# Patient Record
Sex: Male | Born: 1940 | Race: White | Hispanic: No | Marital: Married | Smoking: Former smoker
Health system: Southern US, Community
[De-identification: ages and names within clinical notes are randomized; demographics above are authoritative.]

## PROBLEM LIST (undated history)

## (undated) DIAGNOSIS — I4510 Unspecified right bundle-branch block: Secondary | ICD-10-CM

## (undated) DIAGNOSIS — J42 Unspecified chronic bronchitis: Secondary | ICD-10-CM

## (undated) DIAGNOSIS — E785 Hyperlipidemia, unspecified: Secondary | ICD-10-CM

## (undated) DIAGNOSIS — I10 Essential (primary) hypertension: Secondary | ICD-10-CM

## (undated) DIAGNOSIS — I2510 Atherosclerotic heart disease of native coronary artery without angina pectoris: Secondary | ICD-10-CM

## (undated) DIAGNOSIS — I714 Abdominal aortic aneurysm, without rupture: Secondary | ICD-10-CM

## (undated) DIAGNOSIS — D649 Anemia, unspecified: Secondary | ICD-10-CM

## (undated) DIAGNOSIS — F17201 Nicotine dependence, unspecified, in remission: Secondary | ICD-10-CM

## (undated) DIAGNOSIS — Z5111 Encounter for antineoplastic chemotherapy: Secondary | ICD-10-CM

## (undated) DIAGNOSIS — I219 Acute myocardial infarction, unspecified: Secondary | ICD-10-CM

## (undated) DIAGNOSIS — C801 Malignant (primary) neoplasm, unspecified: Secondary | ICD-10-CM

## (undated) DIAGNOSIS — N189 Chronic kidney disease, unspecified: Secondary | ICD-10-CM

## (undated) DIAGNOSIS — M1990 Unspecified osteoarthritis, unspecified site: Secondary | ICD-10-CM

## (undated) DIAGNOSIS — J310 Chronic rhinitis: Secondary | ICD-10-CM

## (undated) DIAGNOSIS — K5790 Diverticulosis of intestine, part unspecified, without perforation or abscess without bleeding: Secondary | ICD-10-CM

## (undated) DIAGNOSIS — J189 Pneumonia, unspecified organism: Secondary | ICD-10-CM

## (undated) DIAGNOSIS — I255 Ischemic cardiomyopathy: Secondary | ICD-10-CM

## (undated) DIAGNOSIS — N529 Male erectile dysfunction, unspecified: Secondary | ICD-10-CM

## (undated) DIAGNOSIS — J449 Chronic obstructive pulmonary disease, unspecified: Secondary | ICD-10-CM

## (undated) DIAGNOSIS — R7301 Impaired fasting glucose: Secondary | ICD-10-CM

## (undated) DIAGNOSIS — R0600 Dyspnea, unspecified: Secondary | ICD-10-CM

## (undated) DIAGNOSIS — Z9289 Personal history of other medical treatment: Secondary | ICD-10-CM

## (undated) DIAGNOSIS — K219 Gastro-esophageal reflux disease without esophagitis: Secondary | ICD-10-CM

## (undated) DIAGNOSIS — I509 Heart failure, unspecified: Secondary | ICD-10-CM

## (undated) DIAGNOSIS — K635 Polyp of colon: Secondary | ICD-10-CM

## (undated) HISTORY — DX: Malignant (primary) neoplasm, unspecified: C80.1

## (undated) HISTORY — DX: Unspecified right bundle-branch block: I45.10

## (undated) HISTORY — DX: Unspecified osteoarthritis, unspecified site: M19.90

## (undated) HISTORY — DX: Atherosclerotic heart disease of native coronary artery without angina pectoris: I25.10

## (undated) HISTORY — PX: JOINT REPLACEMENT: SHX530

## (undated) HISTORY — DX: Nicotine dependence, unspecified, in remission: F17.201

## (undated) HISTORY — DX: Abdominal aortic aneurysm, without rupture: I71.4

## (undated) HISTORY — DX: Polyp of colon: K63.5

## (undated) HISTORY — DX: Diverticulosis of intestine, part unspecified, without perforation or abscess without bleeding: K57.90

## (undated) HISTORY — DX: Gastro-esophageal reflux disease without esophagitis: K21.9

## (undated) HISTORY — DX: Encounter for antineoplastic chemotherapy: Z51.11

## (undated) HISTORY — DX: Male erectile dysfunction, unspecified: N52.9

## (undated) HISTORY — DX: Essential (primary) hypertension: I10

## (undated) HISTORY — DX: Chronic rhinitis: J31.0

## (undated) HISTORY — DX: Impaired fasting glucose: R73.01

## (undated) HISTORY — DX: Hyperlipidemia, unspecified: E78.5

## (undated) HISTORY — DX: Acute myocardial infarction, unspecified: I21.9

## (undated) HISTORY — DX: Chronic obstructive pulmonary disease, unspecified: J44.9

---

## 1994-08-27 DIAGNOSIS — I2510 Atherosclerotic heart disease of native coronary artery without angina pectoris: Secondary | ICD-10-CM

## 1994-08-27 HISTORY — DX: Atherosclerotic heart disease of native coronary artery without angina pectoris: I25.10

## 1995-01-08 HISTORY — PX: CARDIAC CATHETERIZATION: SHX172

## 1995-01-09 HISTORY — PX: CORONARY ARTERY BYPASS GRAFT: SHX141

## 2000-08-27 DIAGNOSIS — K635 Polyp of colon: Secondary | ICD-10-CM

## 2000-08-27 HISTORY — DX: Polyp of colon: K63.5

## 2000-08-27 HISTORY — PX: COLONOSCOPY: SHX174

## 2000-12-02 ENCOUNTER — Ambulatory Visit (HOSPITAL_COMMUNITY): Admission: RE | Admit: 2000-12-02 | Discharge: 2000-12-02 | Payer: Self-pay | Admitting: Family Medicine

## 2000-12-02 ENCOUNTER — Encounter: Payer: Self-pay | Admitting: Family Medicine

## 2001-02-14 ENCOUNTER — Ambulatory Visit (HOSPITAL_COMMUNITY): Admission: RE | Admit: 2001-02-14 | Discharge: 2001-02-14 | Payer: Self-pay | Admitting: Internal Medicine

## 2005-08-24 ENCOUNTER — Ambulatory Visit (HOSPITAL_COMMUNITY): Admission: RE | Admit: 2005-08-24 | Discharge: 2005-08-24 | Payer: Self-pay | Admitting: Family Medicine

## 2007-09-02 ENCOUNTER — Ambulatory Visit: Payer: Self-pay | Admitting: Vascular Surgery

## 2008-08-27 DIAGNOSIS — I714 Abdominal aortic aneurysm, without rupture, unspecified: Secondary | ICD-10-CM

## 2008-08-27 HISTORY — DX: Abdominal aortic aneurysm, without rupture, unspecified: I71.40

## 2008-08-27 HISTORY — DX: Abdominal aortic aneurysm, without rupture: I71.4

## 2008-09-07 ENCOUNTER — Ambulatory Visit: Payer: Self-pay | Admitting: Vascular Surgery

## 2009-03-08 ENCOUNTER — Ambulatory Visit: Payer: Self-pay | Admitting: Vascular Surgery

## 2009-05-09 ENCOUNTER — Encounter (INDEPENDENT_AMBULATORY_CARE_PROVIDER_SITE_OTHER): Payer: Self-pay | Admitting: *Deleted

## 2009-05-09 LAB — CONVERTED CEMR LAB
ALT: 14 units/L
AST: 20 units/L
BUN: 18 mg/dL
Creatinine, Ser: 1.55 mg/dL
HDL: 3.4 mg/dL
Potassium: 5.4 meq/L
Triglycerides: 125 mg/dL

## 2009-09-20 ENCOUNTER — Ambulatory Visit: Payer: Self-pay | Admitting: Vascular Surgery

## 2009-12-05 ENCOUNTER — Encounter (INDEPENDENT_AMBULATORY_CARE_PROVIDER_SITE_OTHER): Payer: Self-pay | Admitting: *Deleted

## 2009-12-05 DIAGNOSIS — I10 Essential (primary) hypertension: Secondary | ICD-10-CM | POA: Insufficient documentation

## 2009-12-05 DIAGNOSIS — E785 Hyperlipidemia, unspecified: Secondary | ICD-10-CM | POA: Insufficient documentation

## 2009-12-06 ENCOUNTER — Ambulatory Visit: Payer: Self-pay | Admitting: Cardiology

## 2009-12-06 DIAGNOSIS — I2510 Atherosclerotic heart disease of native coronary artery without angina pectoris: Secondary | ICD-10-CM

## 2009-12-07 ENCOUNTER — Encounter: Payer: Self-pay | Admitting: Cardiology

## 2009-12-21 ENCOUNTER — Emergency Department (HOSPITAL_COMMUNITY): Admission: EM | Admit: 2009-12-21 | Discharge: 2009-12-21 | Payer: Self-pay | Admitting: Emergency Medicine

## 2009-12-21 ENCOUNTER — Telehealth: Payer: Self-pay | Admitting: Cardiology

## 2009-12-25 HISTORY — PX: LAPAROSCOPIC CHOLECYSTECTOMY: SUR755

## 2009-12-26 ENCOUNTER — Encounter (HOSPITAL_COMMUNITY): Admission: RE | Admit: 2009-12-26 | Discharge: 2009-12-26 | Payer: Self-pay | Admitting: Family Medicine

## 2009-12-27 ENCOUNTER — Telehealth (INDEPENDENT_AMBULATORY_CARE_PROVIDER_SITE_OTHER): Payer: Self-pay | Admitting: *Deleted

## 2010-01-18 ENCOUNTER — Telehealth (INDEPENDENT_AMBULATORY_CARE_PROVIDER_SITE_OTHER): Payer: Self-pay | Admitting: *Deleted

## 2010-01-19 ENCOUNTER — Ambulatory Visit (HOSPITAL_COMMUNITY): Admission: RE | Admit: 2010-01-19 | Discharge: 2010-01-20 | Payer: Self-pay | Admitting: General Surgery

## 2010-01-19 ENCOUNTER — Encounter (INDEPENDENT_AMBULATORY_CARE_PROVIDER_SITE_OTHER): Payer: Self-pay | Admitting: General Surgery

## 2010-03-22 ENCOUNTER — Ambulatory Visit: Payer: Self-pay | Admitting: Vascular Surgery

## 2010-08-30 ENCOUNTER — Ambulatory Visit
Admission: RE | Admit: 2010-08-30 | Discharge: 2010-08-30 | Payer: Self-pay | Source: Home / Self Care | Attending: Vascular Surgery | Admitting: Vascular Surgery

## 2010-09-28 NOTE — Letter (Signed)
Summary: LABS DR Gerda Diss 9.13.10  LABS DR Gerda Diss 9.13.10   Imported By: Faythe Ghee 12/07/2009 15:38:08  _____________________________________________________________________  External Attachment:    Type:   Image     Comment:   External Document

## 2010-09-28 NOTE — Letter (Signed)
Summary: EKG DR Gerda Diss  EKG DR Gerda Diss   Imported By: Faythe Ghee 12/07/2009 15:40:53  _____________________________________________________________________  External Attachment:    Type:   Image     Comment:   External Document

## 2010-09-28 NOTE — Letter (Signed)
Summary: External Correspondence  External Correspondence   Imported By: Faythe Ghee 12/07/2009 15:36:43  _____________________________________________________________________  External Attachment:    Type:   Image     Comment:   External Document

## 2010-09-28 NOTE — Miscellaneous (Signed)
Summary: labs bmp,lipids,liver,05/09/2009  Clinical Lists Changes  Observations: Added new observation of CALCIUM: 9.1 mg/dL (45/40/9811 91:47) Added new observation of ALBUMIN: 4.2 g/dL (82/95/6213 08:65) Added new observation of PROTEIN, TOT: 7.0 g/dL (78/46/9629 52:84) Added new observation of SGPT (ALT): 14 units/L (05/09/2009 16:58) Added new observation of SGOT (AST): 20 units/L (05/09/2009 16:58) Added new observation of ALK PHOS: 62 units/L (05/09/2009 16:58) Added new observation of CREATININE: 1.55 mg/dL (13/24/4010 27:25) Added new observation of BUN: 18 mg/dL (36/64/4034 74:25) Added new observation of BG RANDOM: 85 mg/dL (95/63/8756 43:32) Added new observation of CO2 PLSM/SER: 28 meq/L (05/09/2009 16:58) Added new observation of CL SERUM: 103 meq/L (05/09/2009 16:58) Added new observation of K SERUM: 5.4 meq/L (05/09/2009 16:58) Added new observation of NA: 143 meq/L (05/09/2009 16:58) Added new observation of LDL: 54 mg/dL (95/18/8416 60:63) Added new observation of HDL: 3.4 mg/dL (01/60/1093 23:55) Added new observation of TRIGLYC TOT: 125 mg/dL (73/22/0254 27:06) Added new observation of CHOLESTEROL: 112 mg/dL (23/76/2831 51:76)

## 2010-09-28 NOTE — Progress Notes (Signed)
Summary: surg recomendations  Phone Note Call from Patient Call back at Home Phone [phone]   Caller: pt Reason for Call: Talk to Doctor Summary of Call: S: pt needs to have galbladder surgery and want to know what doctor we would recomend his PCP is saying [facility] [place] [facility]. Initial call taken by: Faythe Ghee,  Dec 27, 2009 2:35 PM  Follow-up for Phone Call        All of the surgeons in [place] are in [facility]; it is the only game in town.  I have confidence in all the doctors in that group.  I have had excellent personal experience with Fredrik Rigger and [place]. Follow-up by: Kathlen Brunswick, MD, [facility],  Jan 01, 2010 9:36 AM  Additional Follow-up for Phone Call Additional follow up Details #1::        [facility] Additional Follow-up by: Teressa Lower RN,  Jan 02, 2010 10:01 AM    Additional Follow-up for Phone Call Additional follow up Details #2::    Unable to reach anyone by telephone, left a detailed message about the gallbladder surgery on the machine. Follow-up by: Teressa Lower RN,  Jan 03, 2010 10:34 AM

## 2010-09-28 NOTE — Progress Notes (Signed)
Summary: Discussion with Dr. Toniann Fail referred to Emergency Dept.  Phone Note From Other Clinic   Caller: Dr. Lilyan Punt Call For: severe abd pain Summary of Call: S:severe abdominal pain B: pt has a AAA, and a hx of renal insuff A: needs a ct, Dr. Gerda Diss just wants to confer with you, call him on his cell [phone] R: Initial call taken by: Teressa Lower RN,  December 21, 2009 1:45 PM  Follow-up for Phone Call        Diagnostic possibilities discussed. Patient referred to the emergency department for a stat noncontrast CT of the abdomen and further evaluation. Follow-up by: Kathlen Brunswick, MD, [facility],  December 22, 2009 9:11 AM

## 2010-09-28 NOTE — Progress Notes (Signed)
  Phone Note From Other Clinic   Caller: Darlene/Wl Initial call taken by: Denny Peon    Faxed LOV over to [phone] [facility]  Jan 18, 2010 9:53 AM

## 2010-09-28 NOTE — Assessment & Plan Note (Signed)
Summary: ***PER DR.STEVE LUKING FOR CAD/TG   Visit Type:  Initial Consult Primary Provider:  Drucilla Chalet   History of Present Illness: [name] he is seen after a very long hiatus at the kind request of Dr. Gerda Diss for further assessment of cardiovascular disease and multiple cardiovascular risk factors.  This very nice gentleman has done superbly since I recommended CABG surgery for him in 1996.  He immediately stopped cigarette smoking after total consumption of 40 pack years.  Control of hypertension and hyperlipidemia has been excellent.  He denies all cardiopulmonary symptoms.  Current Medications (verified): 1)  Aspirin 81 Mg Tbec (Aspirin) .... Take One Tablet By Mouth Daily 2)  Metoprolol Tartrate 50 Mg Tabs (Metoprolol Tartrate) .... Take 1 Tab Two Times A Day 3)  Enalapril Maleate 10 Mg Tabs (Enalapril Maleate) .... Take 1 1/2 Tab Two Times A Day 4)  Simvastatin 40 Mg Tabs (Simvastatin) .... Take 1 Tab Daily 5)  Prilosec 20 Mg Cpdr (Omeprazole) .... Take 1 Tab Daily 6)  Hydrochlorothiazide 25 Mg Tabs (Hydrochlorothiazide) .... Take 1/2 Tab Daily 7)  Ventolin Hfa 108 (90 Base) Mcg/act Aers (Albuterol Sulfate) .... Use Prn 8)  Fish Oil 1000 Mg Caps (Omega-3 Fatty Acids) .... Take 1 Tablet By Mouth Two Times A Day  Allergies (verified): No Known Drug Allergies  Past History:  Family History: Last updated: 12-16-09 Father died at age 59 as a result of a lung neoplasm; history of coronary artery disease  Mother deceased at age 42; cause unknown Siblings: Brother has hypertension and rheumatoid arthritis; 3 sisters are alive and well.  Social History: Last updated: December 16, 2009 Employment-retired from 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: 4.4 cm in 08/2008; 4.44 in 7/10 and 4.65 in 08/2009 HYPERTENSION  (ICD-401.9) HYPERLIPIDEMIA (ICD-272.4) Tobacco abuse: 40 pack year total consumption discontinued in 1996 Gastroesophageal reflux disease Colonic polyps-excised via colonoscopy in 2002  Past Surgical History: CABG surgery-5/96  [place] of Abdomen  Procedure date:  09/20/2009  Findings:      Maximal A-P diameter of aortic aneurysm is 4.65 cm, increased from 4.44 in 02/2009   Family History: Father died at age 19 as a result of a lung neoplasm; history of coronary artery disease  Mother deceased at age 52; cause unknown Siblings: Brother has hypertension and rheumatoid arthritis; 3 sisters are alive and well.  Social History: Employment-retired from [employer] Married with one child Tobacco Use - 40 pack years; quit in 1996 Alcohol Use - no  Review of Systems       Patient requires corrective lenses for near vision.  He reports modest hearing loss.  He experiences occasional mild palpitations.  He has arthritic discomfort in his hips, knees and shoulders.  All other systems reviewed and are negative.  Vital Signs:  Patient profile:   70 year old male Height:      70 inches Weight:      199 pounds BMI:     28.66 Pulse rate:   64 / minute BP sitting:   114 / 61  (right arm)  Vitals Entered By: Dreama Saa, CNA (12/16/09 1:37 PM)  Physical Exam  General:    Somewhat overweight; well-developed; no acute distress: HEENT-[place]/AT; PERRL; EOM intact; conjunctiva and lids nl:  Neck-No JVD; no carotid bruits: Endocrine-No thyromegaly: Lungs-mild inspiratory and expiratory rhonchi and wheezes and prolonged  expiratory phase. CV-normal PMI; normal S1 and S2; no murmur nor gallop Abdomen-BS normal; soft and non-tender without masses or organomegaly: MS-No deformities, cyanosis or clubbing: Neurologic-Nl cranial nerves; symmetric strength and tone: Skin- Warm, no sig. 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  This is partially due to the excellent risk factor management achieved with the assistance of Dr. Gerda Diss.  At this point, I would only add fish oil b.i.d. for its potential beneficial effects.  His dose of aspirin can be reduced to 81 mg q.d.  Problem # 2:  ABDOMINAL AORTIC ANEURYSM, HX OF (ICD-V12.50) There has been slow growth of his aneurysm from 4.1 cm. in early 2010 to 4.44 and more recently 4.65 3 months ago.  Dr. Hart Rochester is following him very carefully for this problem.  Problem # 3:  HYPERLIPIDEMIA (ICD-272.4)  Lipid profile was excellent 6 months ago.  Dr. Gerda Diss will continue to manage treatment of his hyperlipidemia.  Problem # 4:  HYPERTENSION (ICD-401.9) Blood pressure control has been extremely good with systolics typically around 120.  Current medications will be continued.  I will reassess this nice gentleman in one year.  Patient Instructions: 1)  Your physician recommends that you schedule a follow-up appointment in: 1 year 2)  Your physician has recommended you make the following change in your medication: decrease aspirin to 81mg  daily, fish oil 1000mg  two times a day'

## 2010-11-13 LAB — DIFFERENTIAL
Basophils Absolute: 0 10*3/uL (ref 0.0–0.1)
Lymphocytes Relative: 19 % (ref 12–46)
Monocytes Absolute: 1 10*3/uL (ref 0.1–1.0)
Monocytes Relative: 10 % (ref 3–12)
Neutro Abs: 7.5 10*3/uL (ref 1.7–7.7)

## 2010-11-13 LAB — COMPREHENSIVE METABOLIC PANEL
Albumin: 3.9 g/dL (ref 3.5–5.2)
BUN: 25 mg/dL — ABNORMAL HIGH (ref 6–23)
Calcium: 9.4 mg/dL (ref 8.4–10.5)
Creatinine, Ser: 1.75 mg/dL — ABNORMAL HIGH (ref 0.4–1.5)
Total Bilirubin: 0.9 mg/dL (ref 0.3–1.2)
Total Protein: 7.3 g/dL (ref 6.0–8.3)

## 2010-11-13 LAB — CBC
HCT: 41.6 % (ref 39.0–52.0)
MCV: 88.9 fL (ref 78.0–100.0)
Platelets: 153 10*3/uL (ref 150–400)
RDW: 14.3 % (ref 11.5–15.5)

## 2010-11-14 LAB — DIFFERENTIAL
Basophils Absolute: 0.1 10*3/uL (ref 0.0–0.1)
Lymphocytes Relative: 13 % (ref 12–46)
Monocytes Absolute: 0.8 10*3/uL (ref 0.1–1.0)
Neutro Abs: 9.4 10*3/uL — ABNORMAL HIGH (ref 1.7–7.7)
Neutrophils Relative %: 79 % — ABNORMAL HIGH (ref 43–77)

## 2010-11-14 LAB — CBC
HCT: 38.7 % — ABNORMAL LOW (ref 39.0–52.0)
MCV: 86.7 fL (ref 78.0–100.0)
Platelets: 111 10*3/uL — ABNORMAL LOW (ref 150–400)
WBC: 11.8 10*3/uL — ABNORMAL HIGH (ref 4.0–10.5)

## 2010-11-14 LAB — COMPREHENSIVE METABOLIC PANEL
Albumin: 3.9 g/dL (ref 3.5–5.2)
BUN: 15 mg/dL (ref 6–23)
Chloride: 98 mEq/L (ref 96–112)
Creatinine, Ser: 1.46 mg/dL (ref 0.4–1.5)
GFR calc non Af Amer: 48 mL/min — ABNORMAL LOW (ref >60)
Glucose, Bld: 96 mg/dL (ref 70–99)
Total Bilirubin: 0.8 mg/dL (ref 0.3–1.2)

## 2010-11-14 LAB — URINALYSIS, ROUTINE W REFLEX MICROSCOPIC
Bilirubin Urine: NEGATIVE
Nitrite: NEGATIVE
Specific Gravity, Urine: 1.01 (ref 1.005–1.030)
pH: 5.5 (ref 5.0–8.0)

## 2010-11-14 LAB — LIPASE, BLOOD: Lipase: 72 U/L — ABNORMAL HIGH (ref 11–59)

## 2010-12-04 ENCOUNTER — Ambulatory Visit: Payer: Self-pay | Admitting: Cardiology

## 2010-12-22 ENCOUNTER — Encounter: Payer: Self-pay | Admitting: Cardiology

## 2010-12-22 ENCOUNTER — Ambulatory Visit (INDEPENDENT_AMBULATORY_CARE_PROVIDER_SITE_OTHER): Payer: Medicare Other | Admitting: Cardiology

## 2010-12-22 DIAGNOSIS — I714 Abdominal aortic aneurysm, without rupture, unspecified: Secondary | ICD-10-CM

## 2010-12-22 DIAGNOSIS — E785 Hyperlipidemia, unspecified: Secondary | ICD-10-CM

## 2010-12-22 DIAGNOSIS — K219 Gastro-esophageal reflux disease without esophagitis: Secondary | ICD-10-CM

## 2010-12-22 DIAGNOSIS — I2510 Atherosclerotic heart disease of native coronary artery without angina pectoris: Secondary | ICD-10-CM

## 2010-12-22 DIAGNOSIS — I4510 Unspecified right bundle-branch block: Secondary | ICD-10-CM

## 2010-12-22 DIAGNOSIS — I10 Essential (primary) hypertension: Secondary | ICD-10-CM

## 2010-12-22 NOTE — Progress Notes (Signed)
HPI : Mr. Nuzum returns to the office as scheduled for continued assessment and treatment of coronary disease and cardiovascular risk factors.  He continues to do extremely well, walking 2 miles per day, playing golf and experiencing no cardiopulmonary symptoms.  Specifically, he denies orthopnea, PND, syncope, chest pain, dyspnea on exertion and pedal edema.  Laboratory studies are obtained once yearly at the [place].  Values from September show a normal chemistry profile with a borderline creatinine of 1.49 and an excellent lipid profile with total cholesterol 123, triglycerides 118, HDL 34 and LDL of 65.  He was evaluated for abdominal pain last year and found to have cholelithiasis, diverticular disease and degenerative joint disease of the lumbosacral spine.  Uncomplicated laparoscopic cholecystectomy was undertaken in 12/2009.  Current Outpatient Prescriptions on File Prior to Visit  Medication Sig Dispense Refill  . albuterol (VENTOLIN HFA) 108 (90 BASE) MCG/ACT inhaler Inhale 2 puffs into the lungs every 6 (six) hours as needed.        Marland Kitchen aspirin 81 MG tablet Take 81 mg by mouth daily.        . enalapril (VASOTEC) 10 MG tablet Take 15 mg by mouth 2 (two) times daily.       . hydrochlorothiazide 25 MG tablet Take 12.5 mg by mouth daily. Take 1/2 tab daily      . metoprolol (LOPRESSOR) 50 MG tablet Take 50 mg by mouth 2 (two) times daily.        Marland Kitchen omeprazole (PRILOSEC) 20 MG capsule Take 20 mg by mouth daily.        . simvastatin (ZOCOR) 40 MG tablet Take 40 mg by mouth at bedtime.        . Omega-3 Fatty Acids (FISH OIL) 1000 MG CAPS Take 1 capsule by mouth daily.           No Known Allergies    Past medical history, social history, and family history reviewed and updated.  ROS: see HPI.  PHYSICAL EXAM: BP 112/60  Pulse 72  Ht 5\' 10"  (1.778 m)  Wt 194 lb (87.998 kg)  BMI 27.84 kg/m2  General-Well developed; no acute distress Body habitus-proportionate weight and height Neck-No JVD; no  carotid bruits Lungs-mild expiratory wheezes; resonant to percussion Cardiovascular-normal PMI; normal S1 and S2 Abdomen-normal bowel sounds; soft and non-tender without masses or organomegaly; prominent aortic pulsation; liver edge at the right costal margin. Musculoskeletal-No deformities, no cyanosis or clubbing Neurologic-Normal cranial nerves; symmetric strength and tone Skin-Warm, no significant lesions Extremities-distal pulses intact; no edema  ASSESSMENT AND PLAN:

## 2010-12-22 NOTE — Assessment & Plan Note (Signed)
Patient remains asymptomatic with no evidence for myocardial ischemia.  Our current approach of optimizing control of cardiovascular risk factors will continue to be pursued.

## 2010-12-22 NOTE — Assessment & Plan Note (Signed)
4.4 cm 08/2008;4.44 in 7/10 and 4.65 in 08/2009; 4.8 cm by CT scan in 11/2009, but 4.3 cm by ultrasound in 08/2010; Dr. Hart Rochester is following.

## 2010-12-22 NOTE — Assessment & Plan Note (Addendum)
Blood pressure control is excellent; current medications will be continued. 

## 2010-12-22 NOTE — Patient Instructions (Signed)
Your physician recommends that you schedule a follow-up appointment in 1 YEAR.  

## 2010-12-22 NOTE — Assessment & Plan Note (Signed)
Lipid profile is excellent; current medication will be continued.

## 2010-12-23 ENCOUNTER — Encounter: Payer: Self-pay | Admitting: Cardiology

## 2011-01-09 ENCOUNTER — Encounter: Payer: Self-pay | Admitting: Gastroenterology

## 2011-01-09 NOTE — Procedures (Signed)
DUPLEX ULTRASOUND OF ABDOMINAL AORTA   INDICATION:  Follow up abdominal aortic aneurysm.   HISTORY:  Diabetes:  No.  Cardiac:  CABG, 1996.  Hypertension:  Yes.  Smoking:  No.  Connective Tissue Disorder:  Family History:  No.  Previous Surgery:  No.   DUPLEX EXAM:         AP (cm)                   TRANSVERSE (cm)  Proximal             2.39 Cm                   2.39 cm  Mid                  4.41 cm                   4.33 cm  Distal               2.41 cm                   2.52 cm  Right Iliac          1.12 cm  Left Iliac           1.12 cm   PREVIOUS:  Date: 09/02/2007  AP:  4.3  TRANSVERSE:  4.1   IMPRESSION:  Stable abdominal aortic aneurysm with the largest  measurement of 4.41 cm X 4.33 cm.   ___________________________________________  Quita Skye Hart Rochester, M.D.   AS/MEDQ  D:  09/07/2008  T:  09/07/2008  Job:  [id number]

## 2011-01-09 NOTE — Procedures (Signed)
DUPLEX ULTRASOUND OF ABDOMINAL AORTA   INDICATION:  Followup abdominal aortic aneurysm.   HISTORY:  Diabetes:  No  Cardiac:  CABG  Hypertension:  Yes  Smoking:  No  Connective Tissue Disorder:  Family History:  No  Previous Surgery:  No   DUPLEX EXAM:         AP (cm)                   TRANSVERSE (cm)  Proximal             4.65 cm                   3.6 cm  Mid                  3.74 cm                   4.16 cm  Distal               2.03 cm                   2.2 cm  Right Iliac          1.14 cm                   1.12 cm  Left Iliac           1.22 cm                   1.19 cm   PREVIOUS:  Date: March 08, 2009  AP:  4.41  TRANSVERSE:  4.44   IMPRESSION:  Abdominal aortic aneurysm with largest measurement of 4.65  x 3.6 cm.   ___________________________________________  Quita Skye Hart Rochester, M.D.   CJ/MEDQ  D:  09/20/2009  T:  09/20/2009  Job:  [id number]

## 2011-01-09 NOTE — Procedures (Signed)
DUPLEX ULTRASOUND OF ABDOMINAL AORTA   INDICATION:  Followup abdominal aortic aneurysm.   HISTORY:  Diabetes:  No.  Cardiac:  CABG 1996.  Hypertension:  Yes.  Smoking:  Quit in 1996.  Connective Tissue Disorder:  Family History:  No.  Previous Surgery:  No.   DUPLEX EXAM:         AP (cm)                   TRANSVERSE (cm)  Proximal             4.70 cm                   4.20 cm  Mid                  3.62 cm                   4.0 cm  Distal               2.59 cm                   3.23 cm  Right Iliac          Not visualized            Not visualized  Left Iliac           Not visualized            Not visualized   PREVIOUS:  Date:  AP:  4.65  TRANSVERSE:  3.6   IMPRESSION:  1. Abdominal aortic aneurysm noted with largest measurement of 4.70 cm      x 4.20 cm.  2. Right iliac was visualized with color however not adequate image.  3. Left iliac not visualized at all.  4.  Due to body habitus and bowel gas, difficult to image vessels.   ___________________________________________  Quita Skye Hart Rochester, M.D.   CB/MEDQ  D:  03/22/2010  T:  03/22/2010  Job:  [id number]

## 2011-01-09 NOTE — Procedures (Signed)
DUPLEX ULTRASOUND OF ABDOMINAL AORTA   INDICATION:  Follow up abdominal aortic aneurysm.   HISTORY:  Diabetes:  No  Cardiac:  CABG in 1996  Hypertension:  Yes  Smoking:  Previous  Family History:  No  Previous Surgery:  No   DUPLEX EXAM:         AP (cm)                   TRANSVERSE (cm)  Proximal             not visualized            not visualized  Mid                  4.3 cm                    4.0 cm  Distal               3.2 cm                    2.8 cm  Right Iliac          1.1 cm                    1.1 cm  Left Iliac           1.2 cm                    1.1 cm   PREVIOUS:   IMPRESSION:  A stable abdominal aortic aneurysm within the mid distal  aorta.  The largest measurement today is 4.3 x 4.0 cm.  The proximal  aorta was not visualized due to overlying bowel gas.  The iliacs appear  within normal limits.   ___________________________________________  Quita Skye Hart Rochester, M.D.   OD/MEDQ  D:  08/30/2010  T:  08/30/2010  Job:  [id number]

## 2011-01-09 NOTE — Procedures (Signed)
DUPLEX ULTRASOUND OF ABDOMINAL AORTA   INDICATION:  Follow up abdominal aortic aneurysm.   HISTORY:  Diabetes:  No.  Cardiac:  CABG.  Hypertension:  Yes.  Smoking:  No.  Connective Tissue Disorder:  Family History:  No.  Previous Surgery:  No.   DUPLEX EXAM:         AP (cm)                   TRANSVERSE (cm)  Proximal             3.06 cm  Mid                  4.41 cm                   4.44 cm  Distal               2.31 cm                   2.47 cm  Right Iliac          1.1 cm  Left Iliac           1.1 cm   PREVIOUS:  Date: 09/07/08  AP:  4.41  TRANSVERSE:  4.33   IMPRESSION:  Stable abdominal aortic aneurysm with largest measurements  of 4.41 cm X 4.44 cm.   ___________________________________________  Quita Skye Hart Rochester, M.D.   AS/MEDQ  D:  03/08/2009  T:  03/08/2009  Job:  [id number]

## 2011-01-09 NOTE — Procedures (Signed)
DUPLEX ULTRASOUND OF ABDOMINAL AORTA   INDICATION:  Follow-up evaluation of known abdominal aortic aneurysm.   HISTORY:  Diabetes:  No.  Cardiac:  Coronary artery bypass graft on Jan 09, 1995 by Dr. Laneta Simmers.  Hypertension:  Yes.  Smoking:  No.  Connective Tissue Disorder:  Family History:  No.  Previous Surgery:  No.   DUPLEX EXAM:         AP (cm)                   TRANSVERSE (cm)  Proximal             3.3 Cm                    2.6 cm  Mid                  4.3 cm                    4.1 cm  Distal               3.2 cm                    3.5 cm  Right Iliac          0.8 cm                    0.7 cm  Left Iliac           0.7 cm                    0.7 cm   PREVIOUS:  Date: 09/03/2006  AP:  4.1  TRANSVERSE:  4.2   IMPRESSION:  Abdominal aortic aneurysm measurements are stable from  previous study.   ___________________________________________  Paul Sandoval, M.D.   MC/MEDQ  D:  09/02/2007  T:  09/02/2007  Job:  [id number]

## 2011-01-10 ENCOUNTER — Ambulatory Visit (INDEPENDENT_AMBULATORY_CARE_PROVIDER_SITE_OTHER): Payer: Medicare Other | Admitting: Gastroenterology

## 2011-01-10 ENCOUNTER — Encounter: Payer: Self-pay | Admitting: Gastroenterology

## 2011-01-10 VITALS — BP 126/67 | HR 72 | Temp 97.1°F | Ht 70.0 in | Wt 198.8 lb

## 2011-01-10 DIAGNOSIS — Z1211 Encounter for screening for malignant neoplasm of colon: Secondary | ICD-10-CM

## 2011-01-10 NOTE — Progress Notes (Signed)
Primary Care Physician:  Harlow Asa, MD, MD  Primary Gastroenterologist:  Roetta Sessions, MD  Chief Complaint  Patient presents with  . Colon Cancer Screening    HPI:  Paul Sandoval is a 70 y.o. male here for scheduled 10 year followup colonoscopy. He had colonoscopy in 2002. Denies history of polyps. Denies any constipation, diarrhea, abdominal pain, melena, rectal bleeding, dysphagia. Heartburn well controlled on omeprazole. Heartburn less than 5 years duration.  Current Outpatient Prescriptions  Medication Sig Dispense Refill  . albuterol (VENTOLIN HFA) 108 (90 BASE) MCG/ACT inhaler Inhale 2 puffs into the lungs every 6 (six) hours as needed.        Marland Kitchen aspirin 81 MG tablet Take 81 mg by mouth daily.        . enalapril (VASOTEC) 10 MG tablet Take 15 mg by mouth 2 (two) times daily.       . hydrochlorothiazide 25 MG tablet Take 12.5 mg by mouth daily. Take 1/2 tab daily      . metoprolol (LOPRESSOR) 50 MG tablet Take 50 mg by mouth 2 (two) times daily.        . Omega-3 Fatty Acids (FISH OIL) 1000 MG CAPS Take 1 capsule by mouth daily.        Marland Kitchen omeprazole (PRILOSEC) 20 MG capsule Take 20 mg by mouth daily.        . simvastatin (ZOCOR) 40 MG tablet Take 40 mg by mouth at bedtime.          Allergies as of 01/10/2011  . (No Known Allergies)    Past Medical History  Diagnosis Date  . ASCVD (arteriosclerotic cardiovascular disease) 1996    CABG  . AAA (abdominal aortic aneurysm)     4.4 cm 08/2008;4.44 in 7/10 and 4.65 in 08/2009; 4.8 by CT in 11/2009; 4.3 by ultrasound in 08/2010  . Hypertension   . Hyperlipidemia   . Tobacco abuse, in remission     40 pack year total consumption; discontinued in 1996  . GERD (gastroesophageal reflux disease)   . Right bundle branch block   . Diverticulosis     Past Surgical History  Procedure Date  . Coronary artery bypass graft 1996  . Colonoscopy 2002    no polyps per patient  . Laparoscopic cholecystectomy 12/2009    Family History    Problem Relation Age of Onset  . Colon cancer Neg Hx   . Colon polyps Neg Hx   . Heart disease Father   . Arthritis Mother   . Parkinsonism Mother     History   Social History  . Marital Status: Married    Spouse Name: N/A    Number of Children: 1  . Years of Education: N/A   Occupational History  . retired     Educational psychologist [employer]   Social History Main Topics  . Smoking status: Former Smoker -- 2.0 packs/day for 30 years    Types: Cigarettes    Quit date: 01/08/1995  . Smokeless tobacco: Never Used  . Alcohol Use: No  . Drug Use: No  . Sexually Active: Not on file      ROS:  General: Negative for anorexia, weight loss, fever, chills, fatigue, weakness. Eyes: Negative for vision changes.  ENT: Negative for hoarseness, difficulty swallowing , nasal congestion. CV: Negative for chest pain, angina, palpitations, dyspnea on exertion, peripheral edema.  Respiratory: Negative for dyspnea at rest, dyspnea on exertion, cough, sputum, wheezing.  GI: See history of present illness. GU:  Negative  for dysuria, hematuria, urinary incontinence, urinary frequency, nocturnal urination.  MS: Negative for joint pain, low back pain.  Derm: Negative for rash or itching.  Neuro: Negative for weakness, abnormal sensation, seizure, frequent headaches, memory loss, confusion.  Psych: Negative for anxiety, depression, suicidal ideation, hallucinations.  Endo: Negative for unusual weight change.  Heme: Negative for bruising or bleeding. Allergy: Negative for rash or hives.    Physical Examination:  BP 126/67  Pulse 72  Temp(Src) 97.1 F (36.2 C) (Temporal)  Ht 5\' 10"  (1.778 m)  Wt 198 lb 12.8 oz (90.175 kg)  BMI 28.52 kg/m2   General: Well-nourished, well-developed in no acute distress.  Head: Normocephalic, atraumatic.   Eyes: Conjunctiva pink, no icterus. Mouth: Oropharyngeal mucosa moist and pink , no lesions erythema or exudate. Neck: Supple without thyromegaly, masses, or  lymphadenopathy.  Lungs: Clear to auscultation bilaterally.  Heart: Regular rate and rhythm, no murmurs rubs or gallops.  Abdomen: Bowel sounds are normal, nontender, nondistended, no hepatosplenomegaly or masses, no abdominal bruits or    hernia , no rebound or guarding.   Extremities: No lower extremity edema.  Neuro: Alert and oriented x 4 , grossly normal neurologically.  Skin: Warm and dry, no rash or jaundice.   Psych: Alert and cooperative, normal mood and affect.

## 2011-01-10 NOTE — Assessment & Plan Note (Signed)
Due for 10 year followup colonoscopy. I have discussed the risks, alternatives, benefits with regards to but not limited to the risk of reaction to medication, bleeding, infection, perforation and the patient is agreeable to proceed. Written consent to be obtained.

## 2011-01-10 NOTE — Progress Notes (Signed)
Cc to PCP 

## 2011-01-15 ENCOUNTER — Ambulatory Visit (HOSPITAL_COMMUNITY)
Admission: RE | Admit: 2011-01-15 | Discharge: 2011-01-15 | Disposition: A | Discharge status: Home / Self Care | Payer: Medicare Other | Source: Ambulatory Visit | Attending: Internal Medicine | Admitting: Internal Medicine

## 2011-01-15 ENCOUNTER — Other Ambulatory Visit: Payer: Self-pay | Admitting: Internal Medicine

## 2011-01-15 ENCOUNTER — Encounter: Payer: Medicare Other | Admitting: Internal Medicine

## 2011-01-15 DIAGNOSIS — K5730 Diverticulosis of large intestine without perforation or abscess without bleeding: Secondary | ICD-10-CM | POA: Insufficient documentation

## 2011-01-15 DIAGNOSIS — Z860100 Personal history of colon polyps, unspecified: Secondary | ICD-10-CM | POA: Insufficient documentation

## 2011-01-15 DIAGNOSIS — D126 Benign neoplasm of colon, unspecified: Secondary | ICD-10-CM | POA: Insufficient documentation

## 2011-01-15 DIAGNOSIS — Z1211 Encounter for screening for malignant neoplasm of colon: Secondary | ICD-10-CM | POA: Insufficient documentation

## 2011-01-15 DIAGNOSIS — Z79899 Other long term (current) drug therapy: Secondary | ICD-10-CM | POA: Insufficient documentation

## 2011-01-15 DIAGNOSIS — I10 Essential (primary) hypertension: Secondary | ICD-10-CM | POA: Insufficient documentation

## 2011-01-15 DIAGNOSIS — E785 Hyperlipidemia, unspecified: Secondary | ICD-10-CM | POA: Insufficient documentation

## 2011-01-16 NOTE — Op Note (Signed)
  NAME:  Paul Sandoval, Paul Sandoval           ACCOUNT NO.:  [id number]  MEDICAL RECORD NO.:  [id number]           PATIENT TYPE:  O  LOCATION:  [facility]YP                          FACILITY:  [facility]  PHYSICIAN:  R. Roetta Sessions, M.D. DATE OF BIRTH:  [birth year]  DATE OF PROCEDURE:  01/15/2011 DATE OF DISCHARGE:                              OPERATIVE REPORT   INDICATIONS FOR PROCEDURE:  The patient is a 70 year old gentleman here for 10-year followup colonoscopy.  No lower GI tract symptoms.  No prior history of any adenomatous polyps.  No family history of polyps or colon cancer.  Colonoscopy is now being done as standard screening maneuver. Risks, benefits, limitations, alternatives, and imponderables have been discussed, questions answered.  Please see the documentation in the medical record.  PROCEDURE NOTE:  O2 saturation, blood pressure, pulse, and respirations were monitored throughout the entirety of the procedure.  CONSCIOUS SEDATION:  Versed 4 mg IV, Demerol 50 mg IV in divided doses.  INSTRUMENT:  Pentax video chip system.  FINDINGS:  Digital rectal exam revealed no abnormalities.  Endoscopic findings:  Prep was adequate.  Colon:  Colonic mucosa was surveyed from the rectosigmoid junction through the left transverse right colon to the appendiceal orifice and ileocecal valve/cecum.  These structures were well seen and photographed for the record.  From this level, the scope was slowly withdrawn.  All previously mentioned mucosal surfaces were again seen.  The patient was noted to have a single diminutive polyp at the ascending segment which was cold biopsied/removed.  There was a 6-mm sessile polyp which was cold snared and recovered through the scope was well from the same level.  The patient also had scattered pancolonic diverticula (left side greater than right).  However, the colon otherwise appeared normal.  Scope was pulled down to the rectum where thorough examination of the rectal  mucosa including retroflexed view of the anal verge demonstrated no abnormalities.  The patient tolerated the procedure very well with a cecal withdrawal time of 10 minutes.  IMPRESSION: 1. Normal rectum. 2. Pancolonic diverticula. 3. Ascending colon polyp status post removal as described above.  RECOMMENDATIONS: 1. Diverticulosis and polyp literature provided to [name]. 2. Follow up on path. 3. Further recommendations to follow.     Jonathon Bellows, M.D.     RMR/MEDQ  D:  01/15/2011  T:  01/16/2011  Job:  [id number]  cc:   Donna Bernard, M.D. Fax: [phone]  Electronically Signed by Lorrin Goodell M.D. on 01/16/2011 07:40:42 AM

## 2011-03-13 ENCOUNTER — Ambulatory Visit: Payer: Self-pay | Admitting: Vascular Surgery

## 2011-04-26 ENCOUNTER — Encounter: Payer: Self-pay | Admitting: Vascular Surgery

## 2011-04-27 ENCOUNTER — Encounter: Payer: Self-pay | Admitting: Vascular Surgery

## 2011-05-01 ENCOUNTER — Encounter (INDEPENDENT_AMBULATORY_CARE_PROVIDER_SITE_OTHER): Payer: Medicare Other

## 2011-05-01 ENCOUNTER — Encounter: Payer: Self-pay | Admitting: Vascular Surgery

## 2011-05-01 ENCOUNTER — Ambulatory Visit (INDEPENDENT_AMBULATORY_CARE_PROVIDER_SITE_OTHER): Payer: Medicare Other | Admitting: Vascular Surgery

## 2011-05-01 VITALS — BP 129/62 | HR 57 | Resp 20 | Ht 70.0 in | Wt 194.7 lb

## 2011-05-01 DIAGNOSIS — I714 Abdominal aortic aneurysm, without rupture: Secondary | ICD-10-CM

## 2011-05-01 NOTE — Progress Notes (Signed)
[facility] HISTORY AND PHYSICAL    History of Present Illness: This 70 year old male patient returns for followup regarding his abdominal aortic aneurysm. He denies any abdominal back symptoms since his last visit. He also denies any lower extremity claudication symptoms and walks 5 miles 4 days a week.  Non-Invasive Vascular Imaging today I ordered a duplex scan of his abdominal aortic aneurysm which I reviewed and interpreted. Maximum diameter today is 4.5 x 4.6 cm. This is slightly larger than the dimensions of 6 months ago. Overall it has not changed much over the past 5 years  Past Medical History  Diagnosis Date  . ASCVD (arteriosclerotic cardiovascular disease) 1996    CABG  . AAA (abdominal aortic aneurysm)     4.4 cm 08/2008;4.44 in 7/10 and 4.65 in 08/2009; 4.8 by CT in 11/2009; 4.3 by ultrasound in 08/2010  . Hypertension   . Hyperlipidemia   . Tobacco abuse, in remission     40 pack year total consumption; discontinued in 1996  . GERD (gastroesophageal reflux disease)   . Right bundle branch block   . Diverticulosis   . Myocardial infarction   . Arthritis   . COPD (chronic obstructive pulmonary disease)     History  Substance Use Topics  . Smoking status: Former Smoker -- 2.0 packs/day for 30 years    Types: Cigarettes    Quit date: 01/08/1995  . Smokeless tobacco: Never Used  . Alcohol Use: No    Family History  Problem Relation Age of Onset  . Colon cancer Neg Hx   . Colon polyps Neg Hx   . Heart disease Father   . Arthritis Mother   . Parkinsonism Mother   . Other Mother     parkinsons disease  . Arthritis Sister     ROS: [x]  Positive       [ ]  Negative    all systems are negative and a complete review of systems  General:[ ]  Weight loss,  [ ]  Weight gain, [ ]  Fever, [ ]  chills Neurologic: [ ]  Dizziness, [ ]  Blackouts, [ ]  Headaches, [ ]  Seizure [ ]  Stroke, [ ]  "Mini stroke", [ ]  Slurred speech, [ ]  Temporary blindness;  [  ]weakness, Ear/Nose/Throat: [ ]  Change in hearing, [ ]  Nose bleeds, [ ]  Hoarseness Vascular:[ ]  Pain in legs with walking, [ ]  Pain in feet while lying flat , [ ]   Non-healing ulcer, [ ]  Blood clot in vein,   Pulmonary: [ ]  Home oxygen, [ ]   Productive cough, [ ]  Coughing up blood,  [ ]  Asthma,  [ ]  Wheezing Musculoskeletal:  [ ]  Arthritis, [ ]  low back pain Cardiac: [ ]  Chest pain, [ ]  Shortness of breath when lying flat, [ ]  Shortness of breath with exertion, [ ]  Palpitations, [ ]  Heart murmur, [ ]   Atrial fibrillation Hematologic:[ ]  Easy Bruising, [ ]  Anemia; [ ]  Hepatitis Psychiatric: [ ]   Depression, [ ]  Anxiety  Gastrointestinal: [ ]  Blood in stool, [ ]  Peptic ulcer disease,  [ ]  Gastroesophageal Reflux, [ ]  Trouble swallowing, [ ]  Diarrhea Urinary: [ ]  chronic Kidney disease, [ ]  on HD, [ ]  Burning with urination, [ ]  Frequent urination, [ ]  Difficulty urinating;  Skin: [ ]  Rashes, [ ]  Wounds   No Known Allergies  Current outpatient prescriptions:albuterol (VENTOLIN HFA) 108 (90 BASE) MCG/ACT inhaler, Inhale 2 puffs into the lungs every 6 (six) hours as needed.  , Disp: , Rfl: ;  aspirin 81 MG tablet, Take 81 mg by mouth daily.  , Disp: , Rfl: ;  enalapril (VASOTEC) 10 MG tablet, Take 10 mg by mouth 3 (three) times daily. , Disp: , Rfl: ;  hydrochlorothiazide 25 MG tablet, Take 12.5 mg by mouth daily. Take 1/2 tab daily, Disp: , Rfl:  metoprolol (LOPRESSOR) 50 MG tablet, Take 50 mg by mouth 2 (two) times daily.  , Disp: , Rfl: ;  omeprazole (PRILOSEC) 20 MG capsule, Take 20 mg by mouth daily.  , Disp: , Rfl: ;  simvastatin (ZOCOR) 40 MG tablet, Take 40 mg by mouth at bedtime.  , Disp: , Rfl: ;  Omega-3 Fatty Acids (FISH OIL) 1000 MG CAPS, Take 1 capsule by mouth daily.  , Disp: , Rfl:   Physical Examination  Filed Vitals:   05/01/11 1039  BP: 129/62  Pulse: 57  Resp: 20    General:  WDWN in NAD Gait: Normal HENT: WNL Eyes: Pupils equal Pulmonary: normal non-labored breathing ,  without Rales, rhonchi,  wheezing Cardiac: RRR, without  Murmurs, rubs or gallops Gastrointestinal: soft, NT, small. Mass noted approximating 4-5 cm in diameter which is nontender Skin no rashes, ulcers noted Vascular Exam/Pulses: 3+ dorsalis pedis popliteal and femoral pulses bilaterally No carotid bruits Extremities without ischemic changes, no Gangrene , no cellulitis; no open wounds;  Musculoskeletal: no muscle wasting or atrophy  Neurologic: A&O X 3; Appropriate Affect ; SENSATION: normal; MOTOR FUNCTION:  moving all extremities equally. Speech is fluent/normal   ASSESSMENT: Abdominal aortic aneurysm with stable dimensions remains asymptomatic Plan-return in 6 months for continued surveillance of size of abdominal aortic aneurysm.

## 2011-05-08 NOTE — Procedures (Unsigned)
DUPLEX ULTRASOUND OF ABDOMINAL AORTA  INDICATION:  Followup AAA.  HISTORY: Diabetes:  No. Cardiac:  Yes. Hypertension:  Yes. Smoking:  Previous. Connective Tissue Disorder: Family History:  No. Previous Surgery:  No.  DUPLEX EXAM:         AP (cm)                   TRANSVERSE (cm) Proximal             NV                        NV Mid                  4.51 cm                   4.58 cm Distal               2.52 cm                   2.55 cm Right Iliac          0.84 cm                   0.95 cm Left Iliac           0.84 cm                   0.86 cm  PREVIOUS:  Date:  08/30/2010  AP:  4.3  TRANSVERSE:  4.0  IMPRESSION: 1. Evidence of abdominal aortic aneurysm measuring approximately 4.5     cm x 4.6 cm. 2. Bilateral iliac arteries appear within normal limits. 3. Essentially stable results compared to previous study.  ___________________________________________ Quita Skye Hart [place], M.D.  LT/MEDQ  D:  05/01/2011  T:  05/01/2011  Job:  [id number]

## 2011-10-26 ENCOUNTER — Encounter: Payer: Self-pay | Admitting: Vascular Surgery

## 2011-10-29 ENCOUNTER — Encounter (INDEPENDENT_AMBULATORY_CARE_PROVIDER_SITE_OTHER): Payer: Medicare Other | Admitting: *Deleted

## 2011-10-29 DIAGNOSIS — I714 Abdominal aortic aneurysm, without rupture: Secondary | ICD-10-CM

## 2011-11-07 ENCOUNTER — Other Ambulatory Visit: Payer: Self-pay | Admitting: *Deleted

## 2011-11-07 DIAGNOSIS — I714 Abdominal aortic aneurysm, without rupture: Secondary | ICD-10-CM

## 2011-11-08 ENCOUNTER — Encounter: Payer: Self-pay | Admitting: Vascular Surgery

## 2011-11-08 NOTE — Procedures (Unsigned)
DUPLEX ULTRASOUND OF ABDOMINAL AORTA  INDICATION:  Abdominal aortic aneurysm.  HISTORY: Diabetes:  No. Cardiac:  Yes. Hypertension:  Yes. Smoking:  Previous. Connective Tissue Disorder: Family History:  No. Previous Surgery:  No.  DUPLEX EXAM:         AP (cm)                   TRANSVERSE (cm) Proximal             3.0 cm                    3.0 cm Mid                  2.2 cm                    2.5 cm Distal               5.1 cm                    5.0 cm Right Iliac          1.0 cm                    1.3 cm Left Iliac           1.2 cm                    1.3 cm  PREVIOUS:  Date: 05/01/2011  AP:  4.51  TRANSVERSE:  4.58  IMPRESSION:  Aneurysmal dilatation of the distal abdominal aorta with mild increase in maximum diameter when compared to the previous exam. Decreased visualization of the abdominal aorta due to overlying bowel gas pattern.  ___________________________________________ Quita Skye Hart [place], M.D.  CH/MEDQ  D:  10/30/2011  T:  10/30/2011  Job:  [id number]

## 2011-12-16 ENCOUNTER — Encounter: Payer: Self-pay | Admitting: Cardiology

## 2011-12-17 ENCOUNTER — Encounter: Payer: Self-pay | Admitting: Cardiology

## 2011-12-17 ENCOUNTER — Ambulatory Visit (INDEPENDENT_AMBULATORY_CARE_PROVIDER_SITE_OTHER): Payer: Medicare Other | Admitting: Cardiology

## 2011-12-17 VITALS — BP 114/61 | HR 64 | Resp 18 | Ht 70.0 in | Wt 196.0 lb

## 2011-12-17 DIAGNOSIS — R0989 Other specified symptoms and signs involving the circulatory and respiratory systems: Secondary | ICD-10-CM

## 2011-12-17 DIAGNOSIS — E785 Hyperlipidemia, unspecified: Secondary | ICD-10-CM

## 2011-12-17 DIAGNOSIS — I2510 Atherosclerotic heart disease of native coronary artery without angina pectoris: Secondary | ICD-10-CM

## 2011-12-17 DIAGNOSIS — I714 Abdominal aortic aneurysm, without rupture, unspecified: Secondary | ICD-10-CM

## 2011-12-17 DIAGNOSIS — I679 Cerebrovascular disease, unspecified: Secondary | ICD-10-CM

## 2011-12-17 DIAGNOSIS — I10 Essential (primary) hypertension: Secondary | ICD-10-CM

## 2011-12-17 NOTE — Patient Instructions (Signed)
Your physician recommends that you schedule a follow-up appointment in: 1year Your physician has requested that you have a carotid duplex. This test is an ultrasound of the carotid arteries in your neck. It looks at blood flow through these arteries that supply the brain with blood. Allow one hour for this exam. There are no restrictions or special instructions.   

## 2011-12-17 NOTE — Progress Notes (Signed)
Patient ID: Dong Nimmons, male   DOB: 10/03/1940, 71 y.o.   MRN: [id number]  HPI: Scheduled return visit for this very pleasant gentleman with coronary artery disease, now 17 years out from CABG surgery.  During that interval, he has had no cardiopulmonary symptoms and no cardiac issues.  Hypertension and hyperlipidemia have been optimally controlled.  He has no diabetes.  He remains quite active, walking at least 2 miles per day.  Prior to Admission medications   Medication Sig Start Date End Date Taking? Authorizing Provider  albuterol (VENTOLIN HFA) 108 (90 BASE) MCG/ACT inhaler Inhale 2 puffs into the lungs every 6 (six) hours as needed.     Yes Historical Provider, MD  aspirin 81 MG tablet Take 81 mg by mouth daily.     Yes Historical Provider, MD  enalapril (VASOTEC) 10 MG tablet Take 10 mg by mouth 3 (three) times daily.    Yes Historical Provider, MD  hydrochlorothiazide 25 MG tablet Take 12.5 mg by mouth daily. Take 1/2 tab daily   Yes Historical Provider, MD  metoprolol (LOPRESSOR) 50 MG tablet Take 50 mg by mouth 2 (two) times daily.     Yes Historical Provider, MD  Omega-3 Fatty Acids (FISH OIL) 1000 MG CAPS Take 1 capsule by mouth daily.     Yes Historical Provider, MD  omeprazole (PRILOSEC) 20 MG capsule Take 20 mg by mouth daily.     Yes Historical Provider, MD  simvastatin (ZOCOR) 40 MG tablet Take 40 mg by mouth at bedtime.     Yes Historical Provider, MD    No Known Allergies    Past medical history, social history, and family history reviewed and updated.  ROS: Denies orthopnea, PND, pedal edema, claudication, palpitations, lightheadedness or syncope.  All other systems reviewed and are negative.  PHYSICAL EXAM: BP 114/61  Pulse 64  Resp 18  Ht 5\' 10"  (1.778 m)  Wt 88.905 kg (196 lb)  BMI 28.12 kg/m2  General-Well developed; no acute distress Body habitus-Mildly overweight Neck-No JVD; Modest right carotid bruit Lungs-clear lung fields; resonant to  percussion Cardiovascular-normal PMI; normal S1 and S2; minimal systolic murmur Abdomen-normal bowel sounds; soft and non-tender without masses or organomegaly Musculoskeletal-No deformities, no cyanosis or clubbing Neurologic-Normal cranial nerves; symmetric strength and tone Skin-Warm, no significant lesions Extremities-distal pulses intact except for a decreased right posterior tibial; no edema; venectomy scars   ASSESSMENT AND PLAN:  [place] Bing, MD 12/17/2011 2:10 PM

## 2011-12-17 NOTE — Progress Notes (Deleted)
[name]    DOB: 1940/09/06  Age: 71 y.o.  MR#: [id number]       PCP:  Harlow Asa, MD, MD      Insurance: @PAYORNAME @   CC:    Chief Complaint  Patient presents with  . Appointment    no complaints +meds    VS BP 114/61  Pulse 64  Resp 18  Ht 5\' 10"  (1.778 m)  Wt 196 lb (88.905 kg)  BMI 28.12 kg/m2  Weights Current Weight  12/17/11 196 lb (88.905 kg)  05/01/11 194 lb 11.2 oz (88.315 kg)  01/10/11 198 lb 12.8 oz (90.175 kg)    Blood Pressure  BP Readings from Last 3 Encounters:  12/17/11 114/61  05/01/11 129/62  01/10/11 126/67     Admit date:  (Not on file) Last encounter with RMR:  12/16/2011   Allergy No Known Allergies  Current Outpatient Prescriptions  Medication Sig Dispense Refill  . albuterol (VENTOLIN HFA) 108 (90 BASE) MCG/ACT inhaler Inhale 2 puffs into the lungs every 6 (six) hours as needed.        Marland Kitchen aspirin 81 MG tablet Take 81 mg by mouth daily.        . enalapril (VASOTEC) 10 MG tablet Take 10 mg by mouth 3 (three) times daily.       . hydrochlorothiazide 25 MG tablet Take 12.5 mg by mouth daily. Take 1/2 tab daily      . metoprolol (LOPRESSOR) 50 MG tablet Take 50 mg by mouth 2 (two) times daily.        . Omega-3 Fatty Acids (FISH OIL) 1000 MG CAPS Take 1 capsule by mouth daily.        Marland Kitchen omeprazole (PRILOSEC) 20 MG capsule Take 20 mg by mouth daily.        . simvastatin (ZOCOR) 40 MG tablet Take 40 mg by mouth at bedtime.          Discontinued Meds:   There are no discontinued medications.  Patient Active Problem List  Diagnoses  . HYPERLIPIDEMIA  . HYPERTENSION  . Arteriosclerotic cardiovascular disease (ASCVD)  . AAA (abdominal aortic aneurysm)  . GERD (gastroesophageal reflux disease)    LABS No visits with results within 3 Month(s) from this visit. Latest known visit with results is:  Admission on 01/19/2010, Discharged on 01/20/2010  Component Date Value  . WBC 01/18/2010 10.6*  . RBC 01/18/2010 4.68   . Hemoglobin  01/18/2010 13.5   . HCT 01/18/2010 41.6   . MCV 01/18/2010 88.9   . MCHC 01/18/2010 32.5   . RDW 01/18/2010 14.3   . Platelets 01/18/2010 153   . Sodium 01/18/2010 139   . Potassium 01/18/2010 5.4*  . Chloride 01/18/2010 101   . CO2 01/18/2010 30   . Glucose, Bld 01/18/2010 81   . BUN 01/18/2010 25*  . Creatinine, Ser 01/18/2010 1.75*  . Calcium 01/18/2010 9.4   . Total Protein 01/18/2010 7.3   . Albumin 01/18/2010 3.9   . AST 01/18/2010 18   . ALT 01/18/2010 16   . Alkaline Phosphatase 01/18/2010 67   . Total Bilirubin 01/18/2010 0.9   . GFR calc non Af Amer 01/18/2010 39*  . GFR calc Af Amer 01/18/2010 *                   Value:47  The eGFR has been calculated                         using the MDRD equation.                         This calculation has not been                         validated in all clinical                         situations.                         eGFR's persistently                         <60 mL/min signify                         possible Chronic Kidney Disease.  Marland Kitchen Neutrophils Relative 01/18/2010 71   . Neutro Abs 01/18/2010 7.5   . Lymphocytes Relative 01/18/2010 19   . Lymphs Abs 01/18/2010 2.0   . Monocytes Relative 01/18/2010 10   . Monocytes Absolute 01/18/2010 1.0   . Eosinophils Relative 01/18/2010 1   . Eosinophils Absolute 01/18/2010 0.1   . Basophils Relative 01/18/2010 0   . Basophils Absolute 01/18/2010 0.0      Results for this Opt Visit:     Results for orders placed during the hospital encounter of 01/19/10  CBC      Component Value Range   WBC 10.6 (*) 4.0 - 10.5 (K/uL)   RBC 4.68  4.22 - 5.81 (MIL/uL)   Hemoglobin 13.5  13.0 - 17.0 (g/dL)   HCT 13.0  86.5 - 78.4 (%)   MCV 88.9  78.0 - 100.0 (fL)   MCHC 32.5  30.0 - 36.0 (g/dL)   RDW 69.6  29.5 - 28.4 (%)   Platelets 153  150 - 400 (K/uL)  COMPREHENSIVE METABOLIC PANEL      Component Value Range   Sodium 139  135 - 145 (mEq/L)    Potassium 5.4 (*) 3.5 - 5.1 (mEq/L)   Chloride 101  96 - 112 (mEq/L)   CO2 30  19 - 32 (mEq/L)   Glucose, Bld 81  70 - 99 (mg/dL)   BUN 25 (*) 6 - 23 (mg/dL)   Creatinine, Ser 1.32 (*) 0.4 - 1.5 (mg/dL)   Calcium 9.4  8.4 - 44.0 (mg/dL)   Total Protein 7.3  6.0 - 8.3 (g/dL)   Albumin 3.9  3.5 - 5.2 (g/dL)   AST 18  0 - 37 (U/L)   ALT 16  0 - 53 (U/L)   Alkaline Phosphatase 67  39 - 117 (U/L)   Total Bilirubin 0.9  0.3 - 1.2 (mg/dL)   GFR calc non Af Amer 39 (*) >60 (mL/min)   GFR calc Af Amer   (*) >60 (mL/min)   Value: 47            The eGFR has been calculated     using the MDRD equation.     This calculation has not been     validated in all clinical     situations.     eGFR's persistently     <60 mL/min signify  possible Chronic Kidney Disease.  DIFFERENTIAL      Component Value Range   Neutrophils Relative 71  43 - 77 (%)   Neutro Abs 7.5  1.7 - 7.7 (K/uL)   Lymphocytes Relative 19  12 - 46 (%)   Lymphs Abs 2.0  0.7 - 4.0 (K/uL)   Monocytes Relative 10  3 - 12 (%)   Monocytes Absolute 1.0  0.1 - 1.0 (K/uL)   Eosinophils Relative 1  0 - 5 (%)   Eosinophils Absolute 0.1  0.0 - 0.7 (K/uL)   Basophils Relative 0  0 - 1 (%)   Basophils Absolute 0.0  0.0 - 0.1 (K/uL)    EKG No orders found for this or any previous visit.   Prior Assessment and Plan Problem List as of 12/17/2011          Cardiology Problems   HYPERLIPIDEMIA   Last Assessment & Plan Note   12/22/2010 Office Visit Signed 12/22/2010  2:44 PM by Kathlen Brunswick, MD    Lipid profile is excellent; current medication will be continued.    HYPERTENSION   Last Assessment & Plan Note   12/22/2010 Office Visit Addendum 12/23/2010  6:09 PM by Kathlen Brunswick, MD    Blood pressure control is excellent; current medications will be continued.    Arteriosclerotic cardiovascular disease (ASCVD)   Last Assessment & Plan Note   12/22/2010 Office Visit Signed 12/22/2010  2:44 PM by Kathlen Brunswick, MD     Patient remains asymptomatic with no evidence for myocardial ischemia.  Our current approach of optimizing control of cardiovascular risk factors will continue to be pursued.    AAA (abdominal aortic aneurysm)   Last Assessment & Plan Note   12/22/2010 Office Visit Signed 12/22/2010  2:43 PM by Kathlen Brunswick, MD    4.4 cm 08/2008;4.44 in 7/10 and 4.65 in 08/2009; 4.8 cm by CT scan in 11/2009, but 4.3 cm by ultrasound in 08/2010; Dr. Hart Rochester is following.      Other   GERD (gastroesophageal reflux disease)       Imaging: No results found.   FRS Calculation: Score not calculated. Missing: Total Cholesterol

## 2011-12-18 ENCOUNTER — Encounter: Payer: Self-pay | Admitting: Cardiology

## 2011-12-18 DIAGNOSIS — I679 Cerebrovascular disease, unspecified: Secondary | ICD-10-CM | POA: Insufficient documentation

## 2011-12-18 NOTE — Assessment & Plan Note (Signed)
Most recent lipid profile was superb; current therapy will be continued.

## 2011-12-18 NOTE — Assessment & Plan Note (Signed)
Carotid ultrasound will be performed for further assessment apparent carotid disease.

## 2011-12-18 NOTE — Assessment & Plan Note (Signed)
Blood pressure control has been superb with current medication, which will be continued.

## 2011-12-18 NOTE — Assessment & Plan Note (Signed)
Substantial progression of abdominal aortic aneurysm size between 08/2010 and 10/2011.  VVS He is monitoring more closely, but unfortunately, it appears that intervention will be required in the not-too-distant future.

## 2011-12-18 NOTE — Assessment & Plan Note (Signed)
Patient continues to be remarkably stable from a cardiac standpoint.  Our approach is optimal management of risk factors at present.

## 2011-12-25 ENCOUNTER — Other Ambulatory Visit (HOSPITAL_COMMUNITY): Payer: Medicare Other

## 2011-12-27 ENCOUNTER — Ambulatory Visit (HOSPITAL_COMMUNITY)
Admission: RE | Admit: 2011-12-27 | Discharge: 2011-12-27 | Disposition: A | Discharge status: Home / Self Care | Payer: Medicare Other | Source: Ambulatory Visit | Attending: Cardiology | Admitting: Cardiology

## 2011-12-27 ENCOUNTER — Encounter: Payer: Self-pay | Admitting: Cardiology

## 2011-12-27 DIAGNOSIS — I6529 Occlusion and stenosis of unspecified carotid artery: Secondary | ICD-10-CM | POA: Insufficient documentation

## 2011-12-27 DIAGNOSIS — R0989 Other specified symptoms and signs involving the circulatory and respiratory systems: Secondary | ICD-10-CM | POA: Insufficient documentation

## 2012-03-10 ENCOUNTER — Ambulatory Visit (HOSPITAL_COMMUNITY)
Admission: RE | Admit: 2012-03-10 | Discharge: 2012-03-10 | Disposition: A | Discharge status: Home / Self Care | Payer: Medicare Other | Source: Ambulatory Visit | Attending: Family Medicine | Admitting: Family Medicine

## 2012-03-10 ENCOUNTER — Other Ambulatory Visit: Payer: Self-pay | Admitting: Family Medicine

## 2012-03-10 DIAGNOSIS — R918 Other nonspecific abnormal finding of lung field: Secondary | ICD-10-CM | POA: Insufficient documentation

## 2012-03-10 DIAGNOSIS — I749 Embolism and thrombosis of unspecified artery: Secondary | ICD-10-CM

## 2012-03-10 DIAGNOSIS — R079 Chest pain, unspecified: Secondary | ICD-10-CM | POA: Insufficient documentation

## 2012-03-10 DIAGNOSIS — Z951 Presence of aortocoronary bypass graft: Secondary | ICD-10-CM | POA: Insufficient documentation

## 2012-03-10 DIAGNOSIS — R0781 Pleurodynia: Secondary | ICD-10-CM

## 2012-03-10 LAB — CREATININE, SERUM
Creatinine, Ser: 1.76 mg/dL — ABNORMAL HIGH (ref 0.50–1.35)
GFR calc Af Amer: 43 mL/min — ABNORMAL LOW (ref >90)
GFR calc non Af Amer: 37 mL/min — ABNORMAL LOW (ref >90)

## 2012-03-10 MED ORDER — IOHEXOL 350 MG/ML SOLN
80.0000 mL | Freq: Once | INTRAVENOUS | Status: AC | PRN
  Administered 2012-03-10: 80 mL via INTRAVENOUS

## 2012-03-11 ENCOUNTER — Other Ambulatory Visit: Payer: Self-pay | Admitting: Family Medicine

## 2012-03-11 DIAGNOSIS — I749 Embolism and thrombosis of unspecified artery: Secondary | ICD-10-CM

## 2012-03-31 ENCOUNTER — Other Ambulatory Visit: Payer: Self-pay | Admitting: Family Medicine

## 2012-03-31 ENCOUNTER — Ambulatory Visit (HOSPITAL_COMMUNITY)
Admission: RE | Admit: 2012-03-31 | Discharge: 2012-03-31 | Disposition: A | Discharge status: Home / Self Care | Payer: Medicare Other | Source: Ambulatory Visit | Attending: Family Medicine | Admitting: Family Medicine

## 2012-03-31 DIAGNOSIS — R0781 Pleurodynia: Secondary | ICD-10-CM

## 2012-03-31 DIAGNOSIS — R918 Other nonspecific abnormal finding of lung field: Secondary | ICD-10-CM | POA: Insufficient documentation

## 2012-03-31 DIAGNOSIS — R0789 Other chest pain: Secondary | ICD-10-CM | POA: Insufficient documentation

## 2012-05-05 ENCOUNTER — Encounter: Payer: Self-pay | Admitting: Neurosurgery

## 2012-05-06 ENCOUNTER — Encounter (INDEPENDENT_AMBULATORY_CARE_PROVIDER_SITE_OTHER): Payer: Medicare Other | Admitting: *Deleted

## 2012-05-06 ENCOUNTER — Ambulatory Visit (INDEPENDENT_AMBULATORY_CARE_PROVIDER_SITE_OTHER): Payer: Medicare Other | Admitting: Neurosurgery

## 2012-05-06 ENCOUNTER — Encounter: Payer: Self-pay | Admitting: Neurosurgery

## 2012-05-06 VITALS — BP 131/61 | HR 56 | Resp 14 | Ht 70.0 in | Wt 193.2 lb

## 2012-05-06 DIAGNOSIS — I714 Abdominal aortic aneurysm, without rupture, unspecified: Secondary | ICD-10-CM | POA: Insufficient documentation

## 2012-05-06 NOTE — Progress Notes (Signed)
[facility] [place] [facility]/PAD/PVD Office Note  CC: Six-month AAA surveillance Referring Physician: Hart Rochester  History of Present Illness: 71 year old male patient of Dr. Hart Rochester followed for known AAA. The patient denies any unusual back or abdominal pain. The patient denies any new medical diagnoses or recent surgery.  Past Medical History  Diagnosis Date  . 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 AAA (abdominal aortic aneurysm) 2010    4.4 cm 08/2008;4.44 in 7/10 and 4.65 in 08/2009; 4.8 by CT in 11/2009; 4.3 by ultrasound in 08/2010  . Hypertension   . Hyperlipidemia   . Tobacco abuse, in remission     40 pack year total consumption; discontinued in 1996  . GERD (gastroesophageal reflux disease)   . Right bundle branch block   . Diverticulosis   . Colonic polyp 2002    polypectomy in 2002  . Arthritis   . COPD (chronic obstructive pulmonary disease)     ROS: [x]  Positive   [ ]  Denies    General: [ ]  Weight loss, [ ]  Fever, [ ]  chills Neurologic: [ ]  Dizziness, [ ]  Blackouts, [ ]  Seizure [ ]  Stroke, [ ]  "Mini stroke", [ ]  Slurred speech, [ ]  Temporary blindness; [ ]  weakness in arms or legs, [ ]  Hoarseness Cardiac: [ ]  Chest pain/pressure, [ ]  Shortness of breath at rest [ ]  Shortness of breath with exertion, [ ]  Atrial fibrillation or irregular heartbeat Vascular: [ ]  Pain in legs with walking, [ ]  Pain in legs at rest, [ ]  Pain in legs at night,  [ ]  Non-healing ulcer, [ ]  Blood clot in vein/DVT,   Pulmonary: [ ]  Home oxygen, [ ]  Productive cough, [ ]  Coughing up blood, [ ]  Asthma,  [ ]  Wheezing Musculoskeletal:  [ ]  Arthritis, [ ]  Low back pain, [ ]  Joint pain Hematologic: [ ]  Easy Bruising, [ ]  Anemia; [ ]  Hepatitis Gastrointestinal: [ ]  Blood in stool, [ ]  Gastroesophageal Reflux/heartburn, [ ]  Trouble swallowing Urinary: [ ]  chronic Kidney disease, [ ]  on HD - [ ]  MWF or [ ]  TTHS, [ ]  Burning with urination, [ ]  Difficulty  urinating Skin: [ ]  Rashes, [ ]  Wounds Psychological: [ ]  Anxiety, [ ]  Depression   Social History History  Substance Use Topics  . Smoking status: Former Smoker -- 1.5 packs/day for 30 years    Types: Cigarettes    Quit date: 01/08/1995  . Smokeless tobacco: Never Used  . Alcohol Use: No    Family History Family History  Problem Relation Age of Onset  . Colon cancer Neg Hx   . Colon polyps Neg Hx   . Heart disease Father   . Arthritis Mother   . Parkinsonism Mother   . Arthritis Sister     Brother with rheumatoid arthritis  . Cancer Father     Lung  . Hypertension Brother     No Known Allergies  Current Outpatient Prescriptions  Medication Sig Dispense Refill  . albuterol (VENTOLIN HFA) 108 (90 BASE) MCG/ACT inhaler Inhale 2 puffs into the lungs every 6 (six) hours as needed.        Marland Kitchen aspirin 81 MG tablet Take 81 mg by mouth daily.        . enalapril (VASOTEC) 10 MG tablet Take 10 mg by mouth 3 (three) times daily.       . hydrochlorothiazide 25 MG tablet Take 12.5 mg by mouth daily. Take 1/2 tab daily      .  meclizine (ANTIVERT) 25 MG tablet as needed.      . metoprolol (LOPRESSOR) 50 MG tablet Take 50 mg by mouth 2 (two) times daily.        Marland Kitchen omeprazole (PRILOSEC) 20 MG capsule Take 20 mg by mouth daily.        . simvastatin (ZOCOR) 40 MG tablet Take 40 mg by mouth at bedtime.        . Omega-3 Fatty Acids (FISH OIL) 1000 MG CAPS Take 1 capsule by mouth daily.          Physical Examination  Filed Vitals:   05/06/12 0927  BP: 131/61  Pulse: 56  Resp: 14    Body mass index is 27.72 kg/(m^2).  General:  WDWN in NAD Gait: Normal HEENT: WNL Eyes: Pupils equal Pulmonary: normal non-labored breathing , without Rales, rhonchi,  wheezing Cardiac: RRR, without  Murmurs, rubs or gallops; No carotid bruits Abdomen: soft, NT, no masses Skin: no rashes, ulcers noted Vascular Exam/Pulses: 3+ radial pulses bilaterally, small abdominal pulsatile mass  palpated  Extremities without ischemic changes, no Gangrene , no cellulitis; no open wounds;  Musculoskeletal: no muscle wasting or atrophy  Neurologic: A&O X 3; Appropriate Affect ; SENSATION: normal; MOTOR FUNCTION:  moving all extremities equally. Speech is fluent/normal  Non-Invasive Vascular Imaging: AAA duplex today shows a maximum diameter of 4.7 by 4.6 which is a slight decrease from previous exam in March 2013 which he was 5.1 x 5.1  ASSESSMENT/PLAN: Spoke with Dr. Hart Rochester who agrees the patient should have a CT of the abdomen to determine if measurement is correct as well to determine if endograft stent repair would be appropriate. The patient return in 6 months to see Dr. Hart Rochester with this study. The patient's in agreement with this, his questions were encouraged and answered.  Lauree Chandler ANP  Clinic M.D.: Hart Rochester

## 2012-05-06 NOTE — Addendum Note (Signed)
Addended by: Sharee Pimple on: 05/06/2012 01:25 PM   Modules accepted: Orders

## 2012-10-28 ENCOUNTER — Other Ambulatory Visit: Payer: Self-pay | Admitting: Vascular Surgery

## 2012-11-03 ENCOUNTER — Encounter: Payer: Self-pay | Admitting: Vascular Surgery

## 2012-11-04 ENCOUNTER — Ambulatory Visit (INDEPENDENT_AMBULATORY_CARE_PROVIDER_SITE_OTHER): Payer: Medicare Other | Admitting: Vascular Surgery

## 2012-11-04 ENCOUNTER — Ambulatory Visit
Admission: RE | Admit: 2012-11-04 | Discharge: 2012-11-04 | Disposition: A | Discharge status: Home / Self Care | Payer: Medicare Other | Source: Ambulatory Visit | Attending: Neurosurgery | Admitting: Neurosurgery

## 2012-11-04 ENCOUNTER — Encounter: Payer: Self-pay | Admitting: Vascular Surgery

## 2012-11-04 VITALS — BP 131/72 | HR 72 | Resp 20 | Ht 70.0 in | Wt 192.0 lb

## 2012-11-04 DIAGNOSIS — I714 Abdominal aortic aneurysm, without rupture: Secondary | ICD-10-CM

## 2012-11-04 MED ORDER — IOHEXOL 350 MG/ML SOLN
80.0000 mL | Freq: Once | INTRAVENOUS | Status: AC | PRN
  Administered 2012-11-04: 80 mL via INTRAVENOUS

## 2012-11-04 NOTE — Progress Notes (Signed)
Subjective:     Patient ID: Paul Sandoval, male   DOB: 1941-02-21, 72 y.o.   MRN: [id number]  HPIthis 72 year old male returns today for further discussion regarding his infrarenal abdominal aortic aneurysm which I have been following since 2007. Patient denies any abdominal or back symptoms. He was last seen by [place] a few months ago an ultrasound revealed a 4.8 cm aneurysm. Previously the aneurysm size had been around 5.1 cm. Today he had a CT angiogram which I have reviewed and interpreted and reveals that the aneurysm has now enlarged to 5.3 cm. He does appear to be a good stent graft candidate.  Past Medical History  Diagnosis Date  . 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 AAA (abdominal aortic aneurysm) 2010    4.4 cm 08/2008;4.44 in 7/10 and 4.65 in 08/2009; 4.8 by CT in 11/2009; 4.3 by ultrasound in 08/2010  . Hypertension   . Hyperlipidemia   . Tobacco abuse, in remission     40 pack year total consumption; discontinued in 1996  . GERD (gastroesophageal reflux disease)   . Right bundle branch block   . Diverticulosis   . Colonic polyp 2002    polypectomy in 2002  . Arthritis   . COPD (chronic obstructive pulmonary disease)     History  Substance Use Topics  . Smoking status: Former Smoker -- 1.50 packs/day for 30 years    Types: Cigarettes    Quit date: 01/08/1995  . Smokeless tobacco: Never Used  . Alcohol Use: No    Family History  Problem Relation Age of Onset  . Colon cancer Neg Hx   . Colon polyps Neg Hx   . Heart disease Father   . Arthritis Mother   . Parkinsonism Mother   . Arthritis Sister     Brother with rheumatoid arthritis  . Cancer Father     Lung  . Hypertension Brother     No Known Allergies  Current outpatient prescriptions:albuterol (VENTOLIN HFA) 108 (90 BASE) MCG/ACT inhaler, Inhale 2 puffs into the lungs every 6 (six) hours as needed.  , Disp: , Rfl: ;  aspirin 81 MG tablet, Take 81 mg by mouth daily.  , Disp: ,  Rfl: ;  enalapril (VASOTEC) 10 MG tablet, Take 10 mg by mouth 3 (three) times daily. , Disp: , Rfl: ;  hydrochlorothiazide 25 MG tablet, Take 12.5 mg by mouth daily. Take 1/2 tab daily, Disp: , Rfl:  meclizine (ANTIVERT) 25 MG tablet, as needed., Disp: , Rfl: ;  metoprolol (LOPRESSOR) 50 MG tablet, Take 50 mg by mouth 2 (two) times daily.  , Disp: , Rfl: ;  omeprazole (PRILOSEC) 20 MG capsule, Take 20 mg by mouth daily.  , Disp: , Rfl: ;  simvastatin (ZOCOR) 40 MG tablet, Take 40 mg by mouth at bedtime.  , Disp: , Rfl: ;  Omega-3 Fatty Acids (FISH OIL) 1000 MG CAPS, Take 1 capsule by mouth daily.  , Disp: , Rfl:   BP 131/72  Pulse 72  Resp 20  Ht 5\' 10"  (1.778 m)  Wt 192 lb (87.091 kg)  BMI 27.55 kg/m2  Body mass index is 27.55 kg/(m^2).           Review of Systems Denies chest pain, dyspnea on exertion, PND, orthopnea, hemoptysis, claudication. Walks 2 miles per day without difficulty. No specific symptoms and has been doing well from a cardiac standpoint. Scheduled to see Dr. Dietrich Pates later this month  Objective:   Physical Examblood pressure 131/72 heart rate 70s respirations 20 Gen.-alert and oriented x3 in no apparent distress HEENT normal for age Lungs no rhonchi or wheezing Cardiovascular regular rhythm no murmurs carotid pulses 3+ palpable no bruits audible Abdomen soft nontender no palpable masses Musculoskeletal free of  major deformities Skin clear -no rashes Neurologic normal Lower extremities 3+ femoral and Posterior tibial pulses palpable bilaterally with no edema  Today I reviewed the CT angiogram by computer. Aneurysm does a maximum diameter of 5.3 cm. There is a good and throb renal neck measuring about 1.5-1.7 cm in length with a diameter of approximately 17-21 mm       Assessment:     Infrarenal abdominal aortic aneurysm-appears to be good candidate for  Gore-Excluder stent graft Coronary artery disease with history coronary artery bypass grafting  1996 at which time he discontinued smoking-has done very well since then and is currently asymptomatic    Plan:     #1 patient did see Dr. Dietrich Pates for updated cardiac eval and cardiac clearance #2 have tentatively scheduled him for aortic stent graft procedure on Thursday, April 17

## 2012-11-05 ENCOUNTER — Telehealth: Payer: Self-pay | Admitting: Vascular Surgery

## 2012-11-05 NOTE — Telephone Encounter (Signed)
11/04/12: Patient evaluated by JDL, EVAR/ AO Stent Graft on 12/11/12. Dr Hart Rochester has requested Pre-op clearance with Dr Dietrich Pates prior to surgery. Patient had a standing appt with [facility] for 12/08/12. I spoke with Natasha Bence at the [place] office, and moved the appointment up to 12/04/12. This was the next available with Dr Dietrich Pates.  I notified the patient, and he will continue to call their office to see if any cancellations have opened up an earlier appt, dpm

## 2012-11-11 ENCOUNTER — Encounter: Payer: Self-pay | Admitting: Vascular Surgery

## 2012-11-12 ENCOUNTER — Other Ambulatory Visit: Payer: Self-pay

## 2012-11-25 HISTORY — PX: ABDOMINAL AORTIC ANEURYSM REPAIR: SUR1152

## 2012-11-27 ENCOUNTER — Encounter (HOSPITAL_COMMUNITY): Payer: Self-pay | Admitting: Pharmacy Technician

## 2012-12-03 ENCOUNTER — Encounter (HOSPITAL_COMMUNITY): Payer: Self-pay

## 2012-12-03 ENCOUNTER — Encounter (HOSPITAL_COMMUNITY)
Admission: RE | Admit: 2012-12-03 | Discharge: 2012-12-03 | Disposition: A | Discharge status: Home / Self Care | Payer: Medicare Other | Source: Ambulatory Visit | Attending: Vascular Surgery | Admitting: Vascular Surgery

## 2012-12-03 DIAGNOSIS — Z01818 Encounter for other preprocedural examination: Secondary | ICD-10-CM | POA: Insufficient documentation

## 2012-12-03 DIAGNOSIS — J4489 Other specified chronic obstructive pulmonary disease: Secondary | ICD-10-CM | POA: Insufficient documentation

## 2012-12-03 DIAGNOSIS — I2510 Atherosclerotic heart disease of native coronary artery without angina pectoris: Secondary | ICD-10-CM | POA: Insufficient documentation

## 2012-12-03 DIAGNOSIS — Z87891 Personal history of nicotine dependence: Secondary | ICD-10-CM | POA: Insufficient documentation

## 2012-12-03 DIAGNOSIS — Z951 Presence of aortocoronary bypass graft: Secondary | ICD-10-CM | POA: Insufficient documentation

## 2012-12-03 DIAGNOSIS — J449 Chronic obstructive pulmonary disease, unspecified: Secondary | ICD-10-CM | POA: Insufficient documentation

## 2012-12-03 DIAGNOSIS — Z01812 Encounter for preprocedural laboratory examination: Secondary | ICD-10-CM | POA: Insufficient documentation

## 2012-12-03 DIAGNOSIS — K219 Gastro-esophageal reflux disease without esophagitis: Secondary | ICD-10-CM | POA: Insufficient documentation

## 2012-12-03 DIAGNOSIS — Z01810 Encounter for preprocedural cardiovascular examination: Secondary | ICD-10-CM | POA: Insufficient documentation

## 2012-12-03 DIAGNOSIS — I10 Essential (primary) hypertension: Secondary | ICD-10-CM | POA: Insufficient documentation

## 2012-12-03 DIAGNOSIS — K5730 Diverticulosis of large intestine without perforation or abscess without bleeding: Secondary | ICD-10-CM | POA: Insufficient documentation

## 2012-12-03 DIAGNOSIS — I714 Abdominal aortic aneurysm, without rupture, unspecified: Secondary | ICD-10-CM | POA: Insufficient documentation

## 2012-12-03 HISTORY — DX: Pneumonia, unspecified organism: J18.9

## 2012-12-03 LAB — BLOOD GAS, ARTERIAL
Acid-Base Excess: 3.4 mmol/L — ABNORMAL HIGH (ref 0.0–2.0)
Bicarbonate: 28 mEq/L — ABNORMAL HIGH (ref 20.0–24.0)
Patient temperature: 98.6
TCO2: 29.4 mmol/L (ref 0–100)
pCO2 arterial: 46.6 mmHg — ABNORMAL HIGH (ref 35.0–45.0)
pH, Arterial: 7.395 (ref 7.350–7.450)

## 2012-12-03 LAB — PROTIME-INR
INR: 0.97 (ref 0.00–1.49)
Prothrombin Time: 12.8 seconds (ref 11.6–15.2)

## 2012-12-03 LAB — COMPREHENSIVE METABOLIC PANEL
BUN: 16 mg/dL (ref 6–23)
CO2: 26 mEq/L (ref 19–32)
Chloride: 101 mEq/L (ref 96–112)
Creatinine, Ser: 1.54 mg/dL — ABNORMAL HIGH (ref 0.50–1.35)
GFR calc Af Amer: 50 mL/min — ABNORMAL LOW (ref >90)
GFR calc non Af Amer: 43 mL/min — ABNORMAL LOW (ref >90)
Total Bilirubin: 0.5 mg/dL (ref 0.3–1.2)

## 2012-12-03 LAB — URINALYSIS, ROUTINE W REFLEX MICROSCOPIC
Bilirubin Urine: NEGATIVE
Hgb urine dipstick: NEGATIVE
Nitrite: NEGATIVE
Specific Gravity, Urine: 1.013 (ref 1.005–1.030)
pH: 5.5 (ref 5.0–8.0)

## 2012-12-03 LAB — CBC
HCT: 41.9 % (ref 39.0–52.0)
MCH: 29.4 pg (ref 26.0–34.0)
MCV: 86 fL (ref 78.0–100.0)
RBC: 4.87 MIL/uL (ref 4.22–5.81)
WBC: 10.5 10*3/uL (ref 4.0–10.5)

## 2012-12-03 LAB — SURGICAL PCR SCREEN: MRSA, PCR: NEGATIVE

## 2012-12-03 NOTE — Pre-Procedure Instructions (Signed)
Paul Sandoval.  12/03/2012   Your procedure is scheduled on:  Thursday April 17  Report to [facility] Short Stay Center at 5:30AM.  Call this number if you have problems the morning of surgery: [phone]   Remember:   Do not eat food or drink liquids after midnight.   Take these medicines the morning of surgery with A SIP OF WATER: tylenol, albuterol inhaler,metoprolol, prilosec   Do not wear jewelry, make-up or nail polish.  Do not wear lotions, powders, or perfumes. You may wear deodorant.  Do not shave 48 hours prior to surgery. Men may shave face and neck.  Do not bring valuables to the hospital.  Contacts, dentures or bridgework may not be worn into surgery.  Leave suitcase in the car. After surgery it may be brought to your room.  For patients admitted to the hospital, checkout time is 11:00 AM the day of  discharge.   Patients discharged the day of surgery will not be allowed to drive  home.  Name and phone number of your driver:   Special Instructions: Shower using CHG 2 nights before surgery and the night before surgery.  If you shower the day of surgery use CHG.  Use special wash - you have one bottle of CHG for all showers.  You should use approximately 1/3 of the bottle for each shower.   Please read over the following fact sheets that you were given: Pain Booklet, Coughing and Deep Breathing, Blood Transfusion Information, MRSA Information and Surgical Site Infection Prevention

## 2012-12-03 NOTE — Progress Notes (Signed)
Cardiologist: Dr. Dietrich Pates in [place] states he has an appointment tomorrow-yearly check up. Will request ekg/notes/cardiac studies  Primary: Dr. Horald Pollen at [facility].

## 2012-12-03 NOTE — Progress Notes (Addendum)
Anesthesia chart review: Patient is a 72 year old male scheduled for EVAR AAA by Dr. Hart Rochester on 12/11/2012.  History includes AAA, former smoker, HLD, GERD, HTN, COPD, diverticulosis, CAD s/p CABG '96, cholecystectomy.  Cardiologist is Dr. [place] Bing.  He is seeing patient on 12/04/12 for routine follow-up and preoperative evaluation.    EKG on 12/03/12 showed SB @ 55 bpm, right BBB, cannot rule out inferior infarct (age undetermined) wave abnormality, consider lateral ischemia.  His T wave abnormality is more pronounced when compared to a prior EKG on 01/18/10.    CXR impression form 03/31/13 showed: Improved right middle lobe airspace disease compatible with resolved pneumonia.   Preoperative labs noted.  Cr 1.54 (previously 1.61 and 1.76 since 03/10/12).  PLT count 131K. PT/PTT WNL.  T&C done.  Await additional cardiology input following tomorrow's appointment.  Vel[facility] Short Stay Center/Anesthesiology Phone [phone] 12/03/2012 2:15 PM  Addendum: 12/05/12 1150 Below is a portion of Dr. Marvel Plan assessment from patient's appointment on 12/04/12. Patient has had progressive increase in aortic diameter since aneurysm was discovered at the time of cardiac surgery nearly 20 years ago. It has expanded fairly rapidly over the past few years and has recently been measured at 5.3 cm prompting a decision to proceed with repair. A percutaneous procedure with a stent graft is planned and will be undertaken in one week. With known coronary disease, there is some risk of cardiac complications, but patient has been asymptomatic since cardiac surgery, continues to walk 2 miles per day without difficulty, and is treated with appropriate medication and dose has optimized his operative risk. No further testing or treatment is required. Aspirin and beta blocker should be continued perioperatively.

## 2012-12-04 ENCOUNTER — Encounter: Payer: Self-pay | Admitting: Cardiology

## 2012-12-04 ENCOUNTER — Ambulatory Visit (INDEPENDENT_AMBULATORY_CARE_PROVIDER_SITE_OTHER): Payer: Medicare Other | Admitting: Cardiology

## 2012-12-04 VITALS — BP 112/52 | HR 86 | Ht 70.0 in | Wt 195.0 lb

## 2012-12-04 DIAGNOSIS — I714 Abdominal aortic aneurysm, without rupture: Secondary | ICD-10-CM

## 2012-12-04 DIAGNOSIS — E785 Hyperlipidemia, unspecified: Secondary | ICD-10-CM

## 2012-12-04 DIAGNOSIS — I10 Essential (primary) hypertension: Secondary | ICD-10-CM

## 2012-12-04 DIAGNOSIS — I2510 Atherosclerotic heart disease of native coronary artery without angina pectoris: Secondary | ICD-10-CM

## 2012-12-04 DIAGNOSIS — I6529 Occlusion and stenosis of unspecified carotid artery: Secondary | ICD-10-CM

## 2012-12-04 DIAGNOSIS — I7090 Unspecified atherosclerosis: Secondary | ICD-10-CM

## 2012-12-04 DIAGNOSIS — E782 Mixed hyperlipidemia: Secondary | ICD-10-CM

## 2012-12-04 DIAGNOSIS — I6522 Occlusion and stenosis of left carotid artery: Secondary | ICD-10-CM

## 2012-12-04 NOTE — Patient Instructions (Addendum)
Your physician recommends that you schedule a follow-up appointment in: 1 year  Your physician has requested that you have a carotid duplex. This test is an ultrasound of the carotid arteries in your neck. It looks at blood flow through these arteries that supply the brain with blood. Allow one hour for this exam. There are no restrictions or special instructions.  Your physician recommends that you return for lab work in: within the week

## 2012-12-04 NOTE — Assessment & Plan Note (Signed)
Blood pressure control is good with current medication, which will be continued. 

## 2012-12-04 NOTE — Assessment & Plan Note (Addendum)
Patient has had progressive increase in aortic diameter since aneurysm was discovered at the time of cardiac surgery nearly 20 years ago. It has expanded fairly rapidly over the past few years and has recently been measured at 5.3 cm prompting a decision to proceed with repair. A percutaneous procedure with a stent graft is planned and will be undertaken in one week. With known coronary disease, there is some risk of cardiac complications, but patient has been asymptomatic since cardiac surgery, continues to walk 2 miles per day without difficulty, and is treated with appropriate medication and thus has optimized his operative risk.  No further testing or treatment is required. Aspirin and beta blocker should be continued perioperatively.

## 2012-12-04 NOTE — Assessment & Plan Note (Addendum)
Patient remains asymptomatic from the standpoint of coronary artery disease. We will continue to maximally treat cardiovascular risk factors.

## 2012-12-04 NOTE — Progress Notes (Signed)
Patient ID: Paul Sandoval., male   DOB: May 15, 1941, 72 y.o.   MRN: [id number]  HPI: Schedule return visit for this nice gentleman with coronary disease, cerebrovascular disease and peripheral vascular disease including abdominal aortic aneurysm.  Since my previous evaluation of him, abdominal imaging has revealed further enlargement of his aneurysm to 5.3 cm prompting a recommendation for stent graft repair by Dr. Hart Rochester. That procedure is scheduled 1 week hence.  [name] has had no symptoms referable to myocardial ischemia since CABG surgery in 1996. He remains active, including walking 2 miles per day, without dyspnea, fatigue or chest pain.  Current Outpatient Prescriptions  Medication Sig Dispense Refill  . aspirin EC 81 MG tablet Take 81 mg by mouth at bedtime.      . enalapril (VASOTEC) 10 MG tablet Take 15 mg by mouth 2 (two) times daily.       . hydrochlorothiazide 25 MG tablet Take 12.5 mg by mouth daily.       . metoprolol (LOPRESSOR) 50 MG tablet Take 50 mg by mouth 2 (two) times daily.       Marland Kitchen omeprazole (PRILOSEC) 20 MG capsule Take 20 mg by mouth daily.        . simvastatin (ZOCOR) 80 MG tablet Take 40 mg by mouth at bedtime.       No current facility-administered medications for this visit.    No Known Allergies   Past medical history, social history, and family history reviewed and updated.  ROS: Denies dyspnea, orthopnea, PND, pedal edema, palpitations, lightheadedness or syncope. All other systems reviewed and are negative.  PHYSICAL EXAM: BP 112/52  Pulse 86  Ht 5\' 10"  (1.778 m)  Wt 88.451 kg (195 lb)  BMI 27.98 kg/m2  SpO2 95%;  Body mass index is 27.98 kg/(m^2). General-Well developed; no acute distress Body habitus-mildly overweight Neck-No JVD; no carotid bruits Lungs-mild inspiratory and expiratory rhonchi; some prolongation of the expiratory phase; resonant to percussion Cardiovascular-normal PMI; normal S1 and S2 Abdomen-normal bowel sounds; soft  and non-tender without masses or organomegaly Musculoskeletal-No deformities, no cyanosis or clubbing Neurologic-Normal cranial nerves; symmetric strength and tone Skin-Warm, no significant lesions Extremities-distal pulses intact; no edema  EKG: Tracing performed yesterday obtained and reviewed. Normal sinus rhythm was present with right bundle branch block; inferior Q waves suggested possible prior inferior MI; right axis deviation present suggesting possible RVH; T-wave inversions consistent with anterolateral ischemia or LVH.  [place] Bing, MD 12/04/2012  3:53 PM  ASSESSMENT AND PLAN

## 2012-12-04 NOTE — Assessment & Plan Note (Signed)
Excellent control of hyperlipidemia when last assessed 6 months ago. Current therapy will be continued.

## 2012-12-04 NOTE — Assessment & Plan Note (Signed)
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 Patient remains free of neurologic symptoms.

## 2012-12-04 NOTE — Progress Notes (Signed)
[name].    DOB: June 26, 1941  Age: 72 y.o.  MR#: [id number]       PCP:  Harlow Asa, MD      Insurance: Payor: [employer]vertising [employer] MEDICARE  Plan: AARP MEDICARE COMPLETE  Product Type: *No Product type*    CC:   No chief complaint on file.  Medication Bottles Scheduled for AAA repair by Dr Hart Rochester on 4/17  VS Filed Vitals:   12/04/12 1258  BP: 112/52  Pulse: 86  Height: 5\' 10"  (1.778 m)  Weight: 195 lb (88.451 kg)  SpO2: 95%    Weights Current Weight  12/04/12 195 lb (88.451 kg)  12/03/12 194 lb 10.7 oz (88.3 kg)  11/04/12 192 lb (87.091 kg)    Blood Pressure  BP Readings from Last 3 Encounters:  12/04/12 112/52  12/03/12 106/65  11/04/12 131/72     Admit date:  (Not on file) Last encounter with RMR:  Visit date not found   Allergy Review of patient's allergies indicates no known allergies.  Current Outpatient Prescriptions  Medication Sig Dispense Refill  . aspirin EC 81 MG tablet Take 81 mg by mouth at bedtime.      . enalapril (VASOTEC) 10 MG tablet Take 15 mg by mouth 2 (two) times daily.       . hydrochlorothiazide 25 MG tablet Take 12.5 mg by mouth daily.       . metoprolol (LOPRESSOR) 50 MG tablet Take 50 mg by mouth 2 (two) times daily.       Marland Kitchen omeprazole (PRILOSEC) 20 MG capsule Take 20 mg by mouth daily.        . simvastatin (ZOCOR) 80 MG tablet Take 40 mg by mouth at bedtime.       No current facility-administered medications for this visit.    Discontinued Meds:    Medications Discontinued During This Encounter  Medication Reason  . acetaminophen (TYLENOL) 500 MG tablet Discontinued by provider  . albuterol (VENTOLIN HFA) 108 (90 BASE) MCG/ACT inhaler Patient has not taken in last 30 days    Patient Active Problem List  Diagnosis  . HYPERLIPIDEMIA  . HYPERTENSION  . Arteriosclerotic cardiovascular disease (ASCVD)  . AAA (abdominal aortic aneurysm)  . GERD (gastroesophageal reflux disease)  . Cerebrovascular disease  . Abdominal  aneurysm without mention of rupture  . AAA (abdominal aortic aneurysm) without rupture    LABS    Component Value Date/Time   NA 136 12/03/2012 1032   NA 139 01/18/2010 0840   NA 136 12/21/2009 1552   K 4.7 12/03/2012 1032   K 5.4* 01/18/2010 0840   K 3.9 12/21/2009 1552   CL 101 12/03/2012 1032   CL 101 01/18/2010 0840   CL 98 12/21/2009 1552   CO2 26 12/03/2012 1032   CO2 30 01/18/2010 0840   CO2 31 12/21/2009 1552   GLUCOSE 94 12/03/2012 1032   GLUCOSE 81 01/18/2010 0840   GLUCOSE 96 12/21/2009 1552   BUN 16 12/03/2012 1032   BUN 25* 10/28/2012 1010   BUN 25* 01/18/2010 0840   CREATININE 1.54* 12/03/2012 1032   CREATININE 1.61* 10/28/2012 1010   CREATININE 1.76* 03/10/2012 1520   CREATININE 1.75* 01/18/2010 0840   CALCIUM 9.5 12/03/2012 1032   CALCIUM 9.4 01/18/2010 0840   CALCIUM 9.2 12/21/2009 1552   GFRNONAA 43* 12/03/2012 1032   GFRNONAA 37* 03/10/2012 1520   GFRNONAA 39* 01/18/2010 0840   GFRAA 50* 12/03/2012 1032   GFRAA 43* 03/10/2012 1520  GFRAA  Value: 47        The eGFR has been calculated using the MDRD equation. This calculation has not been validated in all clinical situations. eGFR's persistently <60 mL/min signify possible Chronic Kidney Disease.* 01/18/2010 0840   CMP     Component Value Date/Time   NA 136 12/03/2012 1032   K 4.7 12/03/2012 1032   CL 101 12/03/2012 1032   CO2 26 12/03/2012 1032   GLUCOSE 94 12/03/2012 1032   BUN 16 12/03/2012 1032   CREATININE 1.54* 12/03/2012 1032   CREATININE 1.61* 10/28/2012 1010   CALCIUM 9.5 12/03/2012 1032   PROT 7.4 12/03/2012 1032   ALBUMIN 3.7 12/03/2012 1032   AST 20 12/03/2012 1032   ALT 12 12/03/2012 1032   ALKPHOS 81 12/03/2012 1032   BILITOT 0.5 12/03/2012 1032   GFRNONAA 43* 12/03/2012 1032   GFRAA 50* 12/03/2012 1032       Component Value Date/Time   WBC 10.5 12/03/2012 1032   WBC 10.6* 01/18/2010 0840   WBC 11.8* 12/21/2009 1552   HGB 14.3 12/03/2012 1032   HGB 13.5 01/18/2010 0840   HGB 13.5 12/21/2009 1552   HCT 41.9 12/03/2012 1032   HCT 41.6 01/18/2010 0840    HCT 38.7* 12/21/2009 1552   MCV 86.0 12/03/2012 1032   MCV 88.9 01/18/2010 0840   MCV 86.7 12/21/2009 1552    Lipid Panel     Component Value Date/Time   CHOL 112 05/09/2009   TRIG 125 05/09/2009   HDL 3.4 05/09/2009   LDLCALC 54 05/09/2009    ABG    Component Value Date/Time   PHART 7.395 12/03/2012 1032   PCO2ART 46.6* 12/03/2012 1032   PO2ART 76.9* 12/03/2012 1032   HCO3 28.0* 12/03/2012 1032   TCO2 29.4 12/03/2012 1032   O2SAT 96.4 12/03/2012 1032     No results found for this basename: TSH   BNP (last 3 results) No results found for this basename: PROBNP,  in the last 8760 hours Cardiac Panel (last 3 results) No results found for this basename: CKTOTAL, CKMB, TROPONINI, RELINDX,  in the last 72 hours  Iron/TIBC/Ferritin No results found for this basename: iron, tibc, ferritin     EKG Orders placed during the hospital encounter of 12/03/12  . EKG 12-LEAD  . EKG 12-LEAD  . EKG 12-LEAD  . EKG 12-LEAD     Prior Assessment and Plan Problem List as of 12/04/2012     ICD-9-CM     Cardiology Problems   HYPERLIPIDEMIA   Last Assessment & Plan   12/17/2011 Office Visit Written 12/18/2011  1:23 PM by Kathlen Brunswick, MD     Most recent lipid profile was superb; current therapy will be continued.    HYPERTENSION   Last Assessment & Plan   12/17/2011 Office Visit Written 12/18/2011  1:24 PM by Kathlen Brunswick, MD     Blood pressure control has been superb with current medication, which will be continued.    Arteriosclerotic cardiovascular disease (ASCVD)   Last Assessment & Plan   12/17/2011 Office Visit Written 12/18/2011  1:23 PM by Kathlen Brunswick, MD     Patient continues to be remarkably stable from a cardiac standpoint.  Our approach is optimal management of risk factors at present.    AAA (abdominal aortic aneurysm)   Last Assessment & Plan   12/17/2011 Office Visit Written 12/18/2011  1:22 PM by Kathlen Brunswick, MD     Substantial progression of abdominal aortic aneurysm  size between 08/2010 and 10/2011.  VVS He is monitoring more closely, but unfortunately, it appears that intervention will be required in the not-too-distant future.    Cerebrovascular disease   Last Assessment & Plan   12/17/2011 Office Visit Written 12/18/2011  1:25 PM by Kathlen Brunswick, MD     Carotid ultrasound will be performed for further assessment apparent carotid disease.    Abdominal aneurysm without mention of rupture   AAA (abdominal aortic aneurysm) without rupture     Other   GERD (gastroesophageal reflux disease)       Imaging: No results found.

## 2012-12-08 ENCOUNTER — Ambulatory Visit: Payer: Medicare Other | Admitting: Cardiology

## 2012-12-10 MED ORDER — DEXTROSE 5 % IV SOLN
1.5000 g | INTRAVENOUS | Status: AC
  Administered 2012-12-11: 1.5 g via INTRAVENOUS
  Filled 2012-12-10: qty 1.5

## 2012-12-11 ENCOUNTER — Other Ambulatory Visit: Payer: Self-pay | Admitting: *Deleted

## 2012-12-11 ENCOUNTER — Encounter (HOSPITAL_COMMUNITY)
Admission: RE | Disposition: A | Discharge status: Home / Self Care | Payer: Self-pay | Source: Ambulatory Visit | Attending: Vascular Surgery

## 2012-12-11 ENCOUNTER — Encounter (HOSPITAL_COMMUNITY): Payer: Self-pay | Admitting: Vascular Surgery

## 2012-12-11 ENCOUNTER — Telehealth: Payer: Self-pay | Admitting: Vascular Surgery

## 2012-12-11 ENCOUNTER — Inpatient Hospital Stay (HOSPITAL_COMMUNITY): Payer: Medicare Other | Admitting: Anesthesiology

## 2012-12-11 ENCOUNTER — Inpatient Hospital Stay (HOSPITAL_COMMUNITY): Payer: Medicare Other

## 2012-12-11 ENCOUNTER — Encounter (HOSPITAL_COMMUNITY): Payer: Self-pay | Admitting: Anesthesiology

## 2012-12-11 ENCOUNTER — Inpatient Hospital Stay (HOSPITAL_COMMUNITY)
Admission: RE | Admit: 2012-12-11 | Discharge: 2012-12-12 | DRG: 238 | Disposition: A | Discharge status: Home / Self Care | Payer: Medicare Other | Source: Ambulatory Visit | Attending: Vascular Surgery | Admitting: Vascular Surgery

## 2012-12-11 DIAGNOSIS — I2510 Atherosclerotic heart disease of native coronary artery without angina pectoris: Secondary | ICD-10-CM | POA: Diagnosis present

## 2012-12-11 DIAGNOSIS — I708 Atherosclerosis of other arteries: Secondary | ICD-10-CM | POA: Diagnosis present

## 2012-12-11 DIAGNOSIS — I10 Essential (primary) hypertension: Secondary | ICD-10-CM | POA: Diagnosis present

## 2012-12-11 DIAGNOSIS — I70219 Atherosclerosis of native arteries of extremities with intermittent claudication, unspecified extremity: Secondary | ICD-10-CM

## 2012-12-11 DIAGNOSIS — J4489 Other specified chronic obstructive pulmonary disease: Secondary | ICD-10-CM | POA: Diagnosis present

## 2012-12-11 DIAGNOSIS — Z8249 Family history of ischemic heart disease and other diseases of the circulatory system: Secondary | ICD-10-CM

## 2012-12-11 DIAGNOSIS — Z801 Family history of malignant neoplasm of trachea, bronchus and lung: Secondary | ICD-10-CM

## 2012-12-11 DIAGNOSIS — I714 Abdominal aortic aneurysm, without rupture, unspecified: Principal | ICD-10-CM | POA: Diagnosis present

## 2012-12-11 DIAGNOSIS — Z7982 Long term (current) use of aspirin: Secondary | ICD-10-CM

## 2012-12-11 DIAGNOSIS — Z87891 Personal history of nicotine dependence: Secondary | ICD-10-CM

## 2012-12-11 DIAGNOSIS — K5730 Diverticulosis of large intestine without perforation or abscess without bleeding: Secondary | ICD-10-CM | POA: Diagnosis present

## 2012-12-11 DIAGNOSIS — E785 Hyperlipidemia, unspecified: Secondary | ICD-10-CM | POA: Diagnosis present

## 2012-12-11 DIAGNOSIS — Z951 Presence of aortocoronary bypass graft: Secondary | ICD-10-CM

## 2012-12-11 DIAGNOSIS — Z79899 Other long term (current) drug therapy: Secondary | ICD-10-CM

## 2012-12-11 DIAGNOSIS — Z48812 Encounter for surgical aftercare following surgery on the circulatory system: Secondary | ICD-10-CM

## 2012-12-11 DIAGNOSIS — Z8261 Family history of arthritis: Secondary | ICD-10-CM

## 2012-12-11 DIAGNOSIS — K219 Gastro-esophageal reflux disease without esophagitis: Secondary | ICD-10-CM | POA: Diagnosis present

## 2012-12-11 DIAGNOSIS — J449 Chronic obstructive pulmonary disease, unspecified: Secondary | ICD-10-CM | POA: Diagnosis present

## 2012-12-11 HISTORY — PX: ABDOMINAL AORTIC ENDOVASCULAR STENT GRAFT: SHX5707

## 2012-12-11 LAB — CBC
HCT: 35 % — ABNORMAL LOW (ref 39.0–52.0)
Hemoglobin: 12 g/dL — ABNORMAL LOW (ref 13.0–17.0)
MCHC: 34.3 g/dL (ref 30.0–36.0)
WBC: 9.8 10*3/uL (ref 4.0–10.5)

## 2012-12-11 LAB — BASIC METABOLIC PANEL
BUN: 17 mg/dL (ref 6–23)
CO2: 27 mEq/L (ref 19–32)
Chloride: 103 mEq/L (ref 96–112)
Chloride: 97 mEq/L (ref 96–112)
Creatinine, Ser: 1.5 mg/dL — ABNORMAL HIGH (ref 0.50–1.35)
Creatinine, Ser: 1.39 mg/dL — ABNORMAL HIGH (ref 0.50–1.35)
GFR calc Af Amer: 52 mL/min — ABNORMAL LOW (ref >90)
GFR calc non Af Amer: 44 mL/min — ABNORMAL LOW (ref >90)
Potassium: 4.2 mEq/L (ref 3.5–5.1)
Potassium: 4 mEq/L (ref 3.5–5.1)

## 2012-12-11 LAB — APTT: aPTT: 29 seconds (ref 24–37)

## 2012-12-11 LAB — PROTIME-INR
INR: 1.22 (ref 0.00–1.49)
Prothrombin Time: 15.2 seconds (ref 11.6–15.2)

## 2012-12-11 LAB — MAGNESIUM: Magnesium: 2 mg/dL (ref 1.5–2.5)

## 2012-12-11 SURGERY — INSERTION, ENDOVASCULAR STENT GRAFT, AORTA, ABDOMINAL
Anesthesia: General | Wound class: Clean

## 2012-12-11 MED ORDER — OXYCODONE HCL 5 MG/5ML PO SOLN: 5.0000 mg | Freq: Once | ORAL | Status: DC | PRN

## 2012-12-11 MED ORDER — DEXTROSE-NACL 5-0.9 % IV SOLN
INTRAVENOUS | Status: DC
  Administered 2012-12-11: 12:00:00 via INTRAVENOUS

## 2012-12-11 MED ORDER — ARTIFICIAL TEARS OP OINT
TOPICAL_OINTMENT | OPHTHALMIC | Status: DC | PRN
  Administered 2012-12-11: 1 via OPHTHALMIC

## 2012-12-11 MED ORDER — LIDOCAINE HCL (CARDIAC) 20 MG/ML IV SOLN
INTRAVENOUS | Status: DC | PRN
  Administered 2012-12-11: 50 mg via INTRAVENOUS

## 2012-12-11 MED ORDER — PROPOFOL 10 MG/ML IV BOLUS
INTRAVENOUS | Status: DC | PRN
  Administered 2012-12-11: 200 mg via INTRAVENOUS

## 2012-12-11 MED ORDER — OXYCODONE HCL 5 MG PO TABS: 5.0000 mg | ORAL_TABLET | Freq: Once | ORAL | Status: DC | PRN

## 2012-12-11 MED ORDER — PROTAMINE SULFATE 10 MG/ML IV SOLN
INTRAVENOUS | Status: DC | PRN
  Administered 2012-12-11: 20 mg via INTRAVENOUS
  Administered 2012-12-11 (×3): 10 mg via INTRAVENOUS

## 2012-12-11 MED ORDER — PHENOL 1.4 % MT LIQD: 1.0000 | OROMUCOSAL | Status: DC | PRN

## 2012-12-11 MED ORDER — FENTANYL CITRATE 0.05 MG/ML IJ SOLN
25.0000 ug | INTRAMUSCULAR | Status: DC | PRN
  Administered 2012-12-11 (×2): 50 ug via INTRAVENOUS

## 2012-12-11 MED ORDER — HEPARIN SODIUM (PORCINE) 1000 UNIT/ML IJ SOLN
INTRAMUSCULAR | Status: DC | PRN
  Administered 2012-12-11: 6000 [IU] via INTRAVENOUS

## 2012-12-11 MED ORDER — ASPIRIN EC 81 MG PO TBEC
81.0000 mg | DELAYED_RELEASE_TABLET | Freq: Every day | ORAL | Status: DC
  Administered 2012-12-11: 81 mg via ORAL
  Filled 2012-12-11 (×2): qty 1

## 2012-12-11 MED ORDER — ENALAPRIL MALEATE 5 MG PO TABS
15.0000 mg | ORAL_TABLET | Freq: Two times a day (BID) | ORAL | Status: DC
  Administered 2012-12-11 – 2012-12-12 (×2): 15 mg via ORAL
  Filled 2012-12-11 (×3): qty 1

## 2012-12-11 MED ORDER — LACTATED RINGERS IV SOLN
INTRAVENOUS | Status: DC | PRN
  Administered 2012-12-11: 07:00:00 via INTRAVENOUS

## 2012-12-11 MED ORDER — MAGNESIUM SULFATE 40 MG/ML IJ SOLN
2.0000 g | Freq: Once | INTRAMUSCULAR | Status: AC | PRN
  Filled 2012-12-11: qty 50

## 2012-12-11 MED ORDER — EPHEDRINE SULFATE 50 MG/ML IJ SOLN
INTRAMUSCULAR | Status: DC | PRN
  Administered 2012-12-11: 10 mg via INTRAVENOUS
  Administered 2012-12-11: 15 mg via INTRAVENOUS
  Administered 2012-12-11 (×2): 10 mg via INTRAVENOUS

## 2012-12-11 MED ORDER — ALUM & MAG HYDROXIDE-SIMETH 200-200-20 MG/5ML PO SUSP: 15.0000 mL | ORAL | Status: DC | PRN

## 2012-12-11 MED ORDER — GLYCOPYRROLATE 0.2 MG/ML IJ SOLN
INTRAMUSCULAR | Status: DC | PRN
  Administered 2012-12-11: .8 mg via INTRAVENOUS

## 2012-12-11 MED ORDER — OXYCODONE-ACETAMINOPHEN 5-325 MG PO TABS
ORAL_TABLET | ORAL | Status: AC
  Filled 2012-12-11: qty 2

## 2012-12-11 MED ORDER — PANTOPRAZOLE SODIUM 40 MG PO TBEC
40.0000 mg | DELAYED_RELEASE_TABLET | Freq: Every day | ORAL | Status: DC
  Administered 2012-12-11 – 2012-12-12 (×2): 40 mg via ORAL
  Filled 2012-12-11 (×2): qty 1

## 2012-12-11 MED ORDER — NEOSTIGMINE METHYLSULFATE 1 MG/ML IJ SOLN
INTRAMUSCULAR | Status: DC | PRN
  Administered 2012-12-11: 4 mg via INTRAVENOUS

## 2012-12-11 MED ORDER — GUAIFENESIN-DM 100-10 MG/5ML PO SYRP: 15.0000 mL | ORAL_SOLUTION | ORAL | Status: DC | PRN

## 2012-12-11 MED ORDER — ACETAMINOPHEN 325 MG PO TABS: 325.0000 mg | ORAL_TABLET | ORAL | Status: DC | PRN

## 2012-12-11 MED ORDER — METOPROLOL TARTRATE 50 MG PO TABS
50.0000 mg | ORAL_TABLET | Freq: Two times a day (BID) | ORAL | Status: DC
  Administered 2012-12-11 – 2012-12-12 (×2): 50 mg via ORAL
  Filled 2012-12-11 (×3): qty 1

## 2012-12-11 MED ORDER — POTASSIUM CHLORIDE CRYS ER 20 MEQ PO TBCR: 20.0000 meq | EXTENDED_RELEASE_TABLET | Freq: Every day | ORAL | Status: DC | PRN

## 2012-12-11 MED ORDER — DOCUSATE SODIUM 100 MG PO CAPS
100.0000 mg | ORAL_CAPSULE | Freq: Every day | ORAL | Status: DC
  Administered 2012-12-12: 100 mg via ORAL
  Filled 2012-12-11: qty 1

## 2012-12-11 MED ORDER — SODIUM CHLORIDE 0.9 % IV SOLN: INTRAVENOUS | Status: DC

## 2012-12-11 MED ORDER — FENTANYL CITRATE 0.05 MG/ML IJ SOLN
INTRAMUSCULAR | Status: DC | PRN
  Administered 2012-12-11 (×4): 50 ug via INTRAVENOUS

## 2012-12-11 MED ORDER — MIDAZOLAM HCL 5 MG/5ML IJ SOLN
INTRAMUSCULAR | Status: DC | PRN
  Administered 2012-12-11: 2 mg via INTRAVENOUS

## 2012-12-11 MED ORDER — HYDRALAZINE HCL 20 MG/ML IJ SOLN: 10.0000 mg | INTRAMUSCULAR | Status: DC | PRN

## 2012-12-11 MED ORDER — METOCLOPRAMIDE HCL 5 MG/ML IJ SOLN: 10.0000 mg | Freq: Once | INTRAMUSCULAR | Status: DC | PRN

## 2012-12-11 MED ORDER — SODIUM CHLORIDE 0.9 % IR SOLN
Status: DC | PRN
  Administered 2012-12-11: 08:00:00

## 2012-12-11 MED ORDER — ACETAMINOPHEN 650 MG RE SUPP: 325.0000 mg | RECTAL | Status: DC | PRN

## 2012-12-11 MED ORDER — FENTANYL CITRATE 0.05 MG/ML IJ SOLN
INTRAMUSCULAR | Status: AC
  Filled 2012-12-11: qty 2

## 2012-12-11 MED ORDER — HYDROCHLOROTHIAZIDE 25 MG PO TABS
12.5000 mg | ORAL_TABLET | Freq: Every day | ORAL | Status: DC
  Filled 2012-12-11: qty 0.5

## 2012-12-11 MED ORDER — HYDROCHLOROTHIAZIDE 12.5 MG PO CAPS
12.5000 mg | ORAL_CAPSULE | Freq: Every day | ORAL | Status: DC
  Administered 2012-12-11 – 2012-12-12 (×2): 12.5 mg via ORAL
  Filled 2012-12-11 (×2): qty 1

## 2012-12-11 MED ORDER — MORPHINE SULFATE 2 MG/ML IJ SOLN: 2.0000 mg | INTRAMUSCULAR | Status: DC | PRN

## 2012-12-11 MED ORDER — DEXTROSE 5 % IV SOLN
1.5000 g | Freq: Two times a day (BID) | INTRAVENOUS | Status: AC
  Administered 2012-12-11 – 2012-12-12 (×2): 1.5 g via INTRAVENOUS
  Filled 2012-12-11 (×2): qty 1.5

## 2012-12-11 MED ORDER — SODIUM CHLORIDE 0.9 % IV SOLN: 500.0000 mL | Freq: Once | INTRAVENOUS | Status: AC | PRN

## 2012-12-11 MED ORDER — ONDANSETRON HCL 4 MG/2ML IJ SOLN
INTRAMUSCULAR | Status: DC | PRN
  Administered 2012-12-11: 4 mg via INTRAVENOUS

## 2012-12-11 MED ORDER — METOPROLOL TARTRATE 1 MG/ML IV SOLN: 2.0000 mg | INTRAVENOUS | Status: DC | PRN

## 2012-12-11 MED ORDER — 0.9 % SODIUM CHLORIDE (POUR BTL) OPTIME
TOPICAL | Status: DC | PRN
  Administered 2012-12-11: 1000 mL

## 2012-12-11 MED ORDER — OXYCODONE-ACETAMINOPHEN 5-325 MG PO TABS
1.0000 | ORAL_TABLET | ORAL | Status: DC | PRN
  Administered 2012-12-11: 2 via ORAL

## 2012-12-11 MED ORDER — LABETALOL HCL 5 MG/ML IV SOLN: 10.0000 mg | INTRAVENOUS | Status: DC | PRN

## 2012-12-11 MED ORDER — ALBUTEROL SULFATE HFA 108 (90 BASE) MCG/ACT IN AERS
2.0000 | INHALATION_SPRAY | Freq: Four times a day (QID) | RESPIRATORY_TRACT | Status: DC | PRN
  Filled 2012-12-11: qty 6.7

## 2012-12-11 MED ORDER — OXYCODONE-ACETAMINOPHEN 5-325 MG PO TABS: 1.0000 | ORAL_TABLET | ORAL | Status: DC | PRN

## 2012-12-11 MED ORDER — ROCURONIUM BROMIDE 100 MG/10ML IV SOLN
INTRAVENOUS | Status: DC | PRN
  Administered 2012-12-11: 50 mg via INTRAVENOUS

## 2012-12-11 MED ORDER — IODIXANOL 320 MG/ML IV SOLN
INTRAVENOUS | Status: DC | PRN
  Administered 2012-12-11: 150 mL via INTRA_ARTERIAL

## 2012-12-11 MED ORDER — ONDANSETRON HCL 4 MG/2ML IJ SOLN: 4.0000 mg | Freq: Four times a day (QID) | INTRAMUSCULAR | Status: DC | PRN

## 2012-12-11 SURGICAL SUPPLY — 90 items
ADH SKN CLS APL DERMABOND .7 (GAUZE/BANDAGES/DRESSINGS) ×1
BAG BANDED W/RUBBER/TAPE 36X54 (MISCELLANEOUS) ×2 IMPLANT
BAG SNAP BAND KOVER 36X36 (MISCELLANEOUS) ×2 IMPLANT
BALLN CODA OCL 2-9.0-35-120-3 (BALLOONS) ×2
BALLN MUSTANG 10.0X40 75 (BALLOONS) ×2
BALLOON COD OCL 2-9.0-35-120-3 (BALLOONS) ×1 IMPLANT
BALLOON MUSTANG 10.0X40 75 (BALLOONS) ×1 IMPLANT
CANISTER SUCTION 2500CC (MISCELLANEOUS) ×2 IMPLANT
CATH BEACON 5.038 65CM KMP-01 (CATHETERS) ×2 IMPLANT
CATH OMNI FLUSH .035X70CM (CATHETERS) ×2 IMPLANT
CLIP TI MEDIUM 24 (CLIP) IMPLANT
CLIP TI WIDE RED SMALL 24 (CLIP) IMPLANT
CLOTH BEACON ORANGE TIMEOUT ST (SAFETY) ×2 IMPLANT
COVER DOME SNAP 22 D (MISCELLANEOUS) ×2 IMPLANT
COVER MAYO STAND STRL (DRAPES) ×2 IMPLANT
COVER PROBE W GEL 5X96 (DRAPES) ×2 IMPLANT
COVER SURGICAL LIGHT HANDLE (MISCELLANEOUS) ×2 IMPLANT
DERMABOND ADVANCED (GAUZE/BANDAGES/DRESSINGS) ×1
DERMABOND ADVANCED .7 DNX12 (GAUZE/BANDAGES/DRESSINGS) ×1 IMPLANT
DEVICE CLOSURE PERCLS PRGLD 6F (VASCULAR PRODUCTS) ×6 IMPLANT
DEVICE TORQUE H2O (MISCELLANEOUS) ×2 IMPLANT
DRAIN CHANNEL 10F 3/8 F FF (DRAIN) IMPLANT
DRAIN CHANNEL 10M FLAT 3/4 FLT (DRAIN) IMPLANT
DRAPE TABLE COVER HEAVY DUTY (DRAPES) ×2 IMPLANT
DRESSING OPSITE X SMALL 2X3 (GAUZE/BANDAGES/DRESSINGS) ×4 IMPLANT
DRYSEAL FLEXSHEATH 12FR 33CM (SHEATH) ×1
DRYSEAL FLEXSHEATH 18FR 33CM (SHEATH) ×1
ELECT CAUTERY BLADE 6.4 (BLADE) IMPLANT
ELECT REM PT RETURN 9FT ADLT (ELECTROSURGICAL) ×4
ELECTRODE REM PT RTRN 9FT ADLT (ELECTROSURGICAL) ×2 IMPLANT
EVACUATOR 3/16  PVC DRAIN (DRAIN)
EVACUATOR 3/16 PVC DRAIN (DRAIN) IMPLANT
EVACUATOR SILICONE 100CC (DRAIN) IMPLANT
GAUZE SPONGE 2X2 8PLY STRL LF (GAUZE/BANDAGES/DRESSINGS) ×1 IMPLANT
GLOVE BIO SURGEON STRL SZ7 (GLOVE) ×2 IMPLANT
GLOVE BIOGEL PI IND STRL 6.5 (GLOVE) ×3 IMPLANT
GLOVE BIOGEL PI IND STRL 7.0 (GLOVE) ×1 IMPLANT
GLOVE BIOGEL PI IND STRL 7.5 (GLOVE) ×1 IMPLANT
GLOVE BIOGEL PI INDICATOR 6.5 (GLOVE) ×3
GLOVE BIOGEL PI INDICATOR 7.0 (GLOVE) ×1
GLOVE BIOGEL PI INDICATOR 7.5 (GLOVE) ×1
GLOVE SS BIOGEL STRL SZ 7 (GLOVE) ×1 IMPLANT
GLOVE SUPERSENSE BIOGEL SZ 7 (GLOVE) ×1
GLOVE SURG SS PI 7.0 STRL IVOR (GLOVE) ×4 IMPLANT
GLOVE SURG SS PI 7.5 STRL IVOR (GLOVE) ×2 IMPLANT
GOWN PREVENTION PLUS XXLARGE (GOWN DISPOSABLE) ×2 IMPLANT
GOWN STRL NON-REIN LRG LVL3 (GOWN DISPOSABLE) ×8 IMPLANT
GRAFT BALLN CATH 65CM (STENTS) ×1 IMPLANT
GRAFT ENDOPROSETHESIS 23/12/16 (Endovascular Graft) ×2 IMPLANT
GRAFT EXCLUDER LEG 12X14 (Endovascular Graft) ×2 IMPLANT
GUIDEWIRE ANGLED .035X150CM (WIRE) ×2 IMPLANT
KIT BASIN OR (CUSTOM PROCEDURE TRAY) ×2 IMPLANT
KIT ENCORE 26 ADVANTAGE (KITS) ×2 IMPLANT
KIT ROOM TURNOVER OR (KITS) ×2 IMPLANT
NEEDLE PERC 18GX7CM (NEEDLE) ×2 IMPLANT
NS IRRIG 1000ML POUR BTL (IV SOLUTION) ×2 IMPLANT
PACK AORTA (CUSTOM PROCEDURE TRAY) ×2 IMPLANT
PAD ARMBOARD 7.5X6 YLW CONV (MISCELLANEOUS) ×4 IMPLANT
PAD SHARPS MAGNETIC DISPOSAL (MISCELLANEOUS) ×2 IMPLANT
PENCIL BUTTON HOLSTER BLD 10FT (ELECTRODE) IMPLANT
PERCLOSE PROGLIDE 6F (VASCULAR PRODUCTS) ×12
PROTECTION STATION PRESSURIZED (MISCELLANEOUS) ×2
SHEATH AVANTI 11CM 8FR (MISCELLANEOUS) ×2 IMPLANT
SHEATH BRITE TIP 8FR 23CM (MISCELLANEOUS) ×2 IMPLANT
SHEATH DRYSEAL FLEX 12FR 33CM (SHEATH) ×1 IMPLANT
SHEATH DRYSEAL FLEX 18FR 33CM (SHEATH) ×1 IMPLANT
SPONGE GAUZE 2X2 STER 10/PKG (GAUZE/BANDAGES/DRESSINGS) ×1
STATION PROTECTION PRESSURIZED (MISCELLANEOUS) ×1 IMPLANT
STENT GRAFT BALLN CATH 65CM (STENTS) ×1
STOPCOCK MORSE 400PSI 3WAY (MISCELLANEOUS) ×2 IMPLANT
SUT ETHILON 3 0 PS 1 (SUTURE) IMPLANT
SUT PROLENE 5 0 C 1 24 (SUTURE) IMPLANT
SUT PROLENE 5 0 CC 1 (SUTURE) IMPLANT
SUT PROLENE 6 0 C 1 30 (SUTURE) IMPLANT
SUT VIC AB 2-0 CT1 27 (SUTURE)
SUT VIC AB 2-0 CT1 TAPERPNT 27 (SUTURE) IMPLANT
SUT VIC AB 3-0 SH 27 (SUTURE)
SUT VIC AB 3-0 SH 27X BRD (SUTURE) IMPLANT
SUT VICRYL 4-0 PS2 18IN ABS (SUTURE) ×4 IMPLANT
SYR 20CC LL (SYRINGE) ×4 IMPLANT
SYR 30ML LL (SYRINGE) IMPLANT
SYR 5ML LL (SYRINGE) IMPLANT
SYR MEDRAD MARK V 150ML (SYRINGE) ×2 IMPLANT
SYRINGE 10CC LL (SYRINGE) ×4 IMPLANT
TOWEL OR 17X24 6PK STRL BLUE (TOWEL DISPOSABLE) ×4 IMPLANT
TOWEL OR 17X26 10 PK STRL BLUE (TOWEL DISPOSABLE) ×4 IMPLANT
TRAY FOLEY CATH 14FRSI W/METER (CATHETERS) ×2 IMPLANT
TUBING HIGH PRESSURE 120CM (CONNECTOR) ×2 IMPLANT
WIRE AMPLATZ SS-J .035X180CM (WIRE) ×4 IMPLANT
WIRE BENTSON .035X145CM (WIRE) ×4 IMPLANT

## 2012-12-11 NOTE — Op Note (Signed)
OPERATIVE REPORT  Date of Surgery: 12/11/2012  Surgeon: Josephina Gip, MD  Assistant: Dr. Durene Cal, Clearence Ped Pre-op Diagnosis: Abdominal Aortic Aneurysm  Post-op Diagnosis: Abdominal Aortic Aneurysm  Procedure: Procedure(s): #1 bilateral common femoral artery access using percutaneous approach with bilateral femoral artery repair using Pro-glide closure device x4 #2 insertion aorto by common iliac stent graft  Gore -C3 using      A- 23 x 12 x 16 cm main body via a right      B- 12 mm x 14 cm contralateral limb-left #3 completion angiography #4 balloon angioplasty left common iliac artery stenosis using 10 mm x 4 cm-Mustang balloon with single inflation at 10 atmospheres for 30 seconds Anesthesia: General  EBL: 100 cc  The patient was taken to the operating room placed in the supine position at which time satisfactory general endotracheal anesthesia was administered. The abdomen and groins were prepped with Betadine scrub and solution draped in a routine sterile manner. Using ultrasound guidance with the SonoSite both common femoral arteries were entered percutaneously after making small stab wounds. For Pro-glide suture closure devices were utilized to on the right and 2 on the left. There were placed at 11:00 and 1:00 position on the right and at the 12:00 and 2:00 position on the left.. A short right 8 French sheath was placed on the right and long 8 French sheath placed on the left. Patient was then heparinized. The [facility] wire was then exchanged for an Amplatz stiff wire and on the right a 18 mm sheath was placed. The main device was placed through the right side. This is a 23 x 12 mm x 16 cm device. This was positioned in the infrarenal aorta. Pigtail catheter and then placed in the suprarenal aorta from the left and a single injection of 10 cc of contrast at 10 cc per second revealed a level of the renal arteries. The graft was deployed appropriately in this placement level  was confirmed with a second injection with good placement just below the lowest-right renal artery. Following this the contralateral gate was cannulated from the left side using a Kumpe catheter and a Glidewire. A retrograde angiogram was performed through the sheath for measurement purposes. A 12 mm x 14 cm contralateral limb was [employer] appropriately. Following this the remainder of the main body was deployed down into the right common iliac artery. All junctions were initially dilated with the Q. 50 balloon. Completion angiogram was performed injecting 20 cc of contrast at 20 cc per second. This reveals a1 A. endoleak along the left lateral wall proximally. This was redilated with the Q. 50 balloon and ultimately with the coda balloon with resolution of this leak. No other leaks were noted. There was a moderate stenosis at the level of the left common iliac artery where there was nonocclusive disease. This is not completely resolved with dilatation using the Q. 50 balloon so a 10 mm x 4 cm-Mustang angioplasty catheter was utilized to dilate this segment. It was inflated at 10 atmospheres for 30 seconds with completion angiogram revealing complete resolution of the stenosis. Following this the sheaths were removed the closure device was deployed with adequate hemostasis bilaterally. Protamine was given to reverse the heparin was closed with Vicryl in subcuticular fashion with Dermabond patient taken to the recovery room in stable condition  Complications: None  Procedure Details:   Josephina Gip, MD 12/11/2012 10:02 AM

## 2012-12-11 NOTE — Anesthesia Postprocedure Evaluation (Signed)
Anesthesia Post Note  Patient: Paul Sandoval.  Procedure(s) Performed: Procedure(s) (LRB): ABDOMINAL AORTIC ENDOVASCULAR STENT GRAFT (N/A)  Anesthesia type: General  Patient location: PACU  Post pain: Pain level controlled  Post assessment: Patient's Cardiovascular Status Stable  Last Vitals:  Filed Vitals:   12/11/12 1115  BP: 141/68  Pulse: 71  Temp:   Resp: 15    Post vital signs: Reviewed and stable  Level of consciousness: alert  Complications: No apparent anesthesia complications

## 2012-12-11 NOTE — Anesthesia Procedure Notes (Signed)
Procedure Name: Intubation Date/Time: 12/11/2012 7:56 AM Performed by: Carmela Rima Pre-anesthesia Checklist: Patient identified, Emergency Drugs available, Suction available, Patient being monitored and Timeout performed Patient Re-evaluated:Patient Re-evaluated prior to inductionOxygen Delivery Method: Circle system utilized Preoxygenation: Pre-oxygenation with 100% oxygen Intubation Type: IV induction Ventilation: Mask ventilation without difficulty Laryngoscope Size: Mac and 3 Grade View: Grade III Tube type: Oral Tube size: 7.5 mm Number of attempts: 1 Placement Confirmation: ETT inserted through vocal cords under direct vision,  positive ETCO2 and CO2 detector Secured at: 23 cm Tube secured with: Tape Dental Injury: Teeth and Oropharynx as per pre-operative assessment

## 2012-12-11 NOTE — Telephone Encounter (Addendum)
Message copied by Rosalyn Charters on Thu Dec 11, 2012  9:54 AM ------      Message from: [place], [place] K      Created: Thu Dec 11, 2012  9:05 AM      Regarding: schedule                   ----- Message -----         From: Marlowe Shores, PA-C         Sent: 12/11/2012   8:59 AM           To: Melene Plan, RN, Sharee Pimple, CMA            4 week f/u EVAR - [facility]son with CTA abd/pelvis -  Or the usual  notified patient of fu appt. on 01-13-13 2:45 cta abd/pelvis prior ------

## 2012-12-11 NOTE — Progress Notes (Signed)
Utilization review completed.  

## 2012-12-11 NOTE — Preoperative (Signed)
Beta Blockers   Reason not to administer Beta Blockers:Not Applicable 

## 2012-12-11 NOTE — Anesthesia Preprocedure Evaluation (Addendum)
Anesthesia Evaluation  Patient identified by MRN, date of birth, ID band Patient awake    Reviewed: Allergy & Precautions, H&P , NPO status , Patient's Chart, lab work & pertinent test results, reviewed documented beta blocker date and time   Airway Mallampati: II TM Distance: >3 FB Neck ROM: full    Dental  (+) Teeth Intact and Dental Advidsory Given   Pulmonary pneumonia -, resolved, COPD COPD inhaler, former smoker,  breath sounds clear to auscultation        Cardiovascular hypertension, On Home Beta Blockers + CABG and + Peripheral Vascular Disease + dysrhythmias Rhythm:regular     Neuro/Psych negative neurological ROS  negative psych ROS   GI/Hepatic Neg liver ROS, GERD-  Medicated and Controlled,  Endo/Other  negative endocrine ROS  Renal/GU negative Renal ROS  negative genitourinary   Musculoskeletal   Abdominal   Peds  Hematology negative hematology ROS (+)   Anesthesia Other Findings See surgeon's H&P   Reproductive/Obstetrics negative OB ROS                          Anesthesia Physical Anesthesia Plan  ASA: III  Anesthesia Plan: General   Post-op Pain Management:    Induction: Intravenous  Airway Management Planned: Oral ETT  Additional Equipment: Arterial line and CVP  Intra-op Plan:   Post-operative Plan: Extubation in OR  Informed Consent: I have reviewed the patients History and Physical, chart, labs and discussed the procedure including the risks, benefits and alternatives for the proposed anesthesia with the patient or authorized representative who has indicated his/her understanding and acceptance.   Dental Advisory Given  Plan Discussed with: CRNA, Surgeon and Anesthesiologist  Anesthesia Plan Comments:        Anesthesia Quick Evaluation

## 2012-12-11 NOTE — Progress Notes (Addendum)
Pt. Had 14 beat run of [facility]; PA Samantha aware.  VSS, patient asymptomatic.  Bmet ordered; Bmet has been drawn.  Will continue to monitor.   Vivi Martens RN

## 2012-12-11 NOTE — Transfer of Care (Signed)
Immediate Anesthesia Transfer of Care Note  Patient: Paul Sandoval.  Procedure(s) Performed: Procedure(s) with comments: ABDOMINAL AORTIC ENDOVASCULAR STENT GRAFT (N/A) - Ultrasound guided; Gore  Patient Location: PACU  Anesthesia Type:General  Level of Consciousness: awake, alert  and oriented  Airway & Oxygen Therapy: Patient Spontanous Breathing and Patient connected to nasal cannula oxygen  Post-op Assessment: Report given to PACU RN, Post -op Vital signs reviewed and stable and Patient moving all extremities X 4  Post vital signs: Reviewed and stable  Complications: No apparent anesthesia complications

## 2012-12-11 NOTE — H&P (Signed)
Vascular Surgery H&P  Chief Complaint: Abdominal aortic aneurysm HPI: Paul Sandoval. is a 72 y.o. male who presents for evaluation of abdominal aortic aneurysm   Past Medical History  Diagnosis Date  . 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 AAA (abdominal aortic aneurysm) 2010    4.4 cm 08/2008;4.44 in 7/10 and 4.65 in 08/2009; 4.8 by CT in 11/2009; 4.3 by ultrasound in 08/2010  . Hypertension   . Hyperlipidemia   . Tobacco abuse, in remission     40 pack year total consumption; discontinued in 1996  . GERD (gastroesophageal reflux disease)   . Right bundle branch block   . Diverticulosis   . Colonic polyp 2002    polypectomy in 2002  . Arthritis   . COPD (chronic obstructive pulmonary disease)   . Pneumonia     few yrs. ago   Past Surgical History  Procedure Laterality Date  . Colonoscopy  2002    polypectomy-patient denies  . Laparoscopic cholecystectomy  12/2009  . 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 Marital Status: Married    Spouse Name: N/A    Number of Children: 1  . Years of Education: N/A   Occupational History  . Retired     [employer]   Social History Main Topics  . Smoking status: Former Smoker -- 1.50 packs/day for 30 years    Types: Cigarettes    Quit date: 01/08/1995  . Smokeless tobacco: Never Used  . Alcohol Use: No  . Drug Use: No  . Sexually Active: None   Other Topics Concern  . None   Social History Narrative  . None   Family History  Problem Relation Age of Onset  . Colon cancer Neg Hx   . Colon polyps Neg Hx   . Heart disease Father   . Arthritis Mother   . Parkinsonism Mother   . Arthritis Sister     Brother with rheumatoid arthritis  . Cancer Father     Lung  . Hypertension Brother    No Known Allergies Prior to Admission medications   Medication Sig Start Date End Date Taking? Authorizing Provider  acetaminophen (TYLENOL) 500 MG tablet  Take 1,000 mg by mouth every 6 (six) hours as needed for pain.   Yes Historical Provider, MD  albuterol (PROVENTIL HFA;VENTOLIN HFA) 108 (90 BASE) MCG/ACT inhaler Inhale 2 puffs into the lungs every 6 (six) hours as needed for wheezing.   Yes Historical Provider, MD  aspirin EC 81 MG tablet Take 81 mg by mouth at bedtime.   Yes Historical Provider, MD  enalapril (VASOTEC) 10 MG tablet Take 15 mg by mouth 2 (two) times daily.    Yes Historical Provider, MD  hydrochlorothiazide 25 MG tablet Take 12.5 mg by mouth daily.    Yes Historical Provider, MD  metoprolol (LOPRESSOR) 50 MG tablet Take 50 mg by mouth 2 (two) times daily.    Yes Historical Provider, MD  omeprazole (PRILOSEC) 20 MG capsule Take 20 mg by mouth daily.     Yes Historical Provider, MD  simvastatin (ZOCOR) 80 MG tablet Take 40 mg by mouth at bedtime.   Yes Historical Provider, MD     Positive ROS:   All other systems have been reviewed and were otherwise negative with the exception of those mentioned in the HPI and as above.  Physical Exam: Filed Vitals:   12/11/12 0546  BP: 148/85  Pulse: 52  Temp: 97.5 F (36.4 C)  Resp: 20    General: Alert, no acute distress HEENT: Normal for age Cardiovascular: Regular rate and rhythm. Carotid pulses 2+, no bruits audible Respiratory: Clear to auscultation. No cyanosis, no use of accessory musculature GI: No organomegaly, abdomen is soft and non-tende-5 cm pulsatile mass Skin: No lesions in the area of chief complaint Neurologic: Sensation intact distally Psychiatric: Patient is competent for consent with normal mood and affect Musculoskeletal: No obvious deformities Extremities: 3+ femoral pulses bilaterally     Assessment/Plan: Aortic stent grafting of abdominal aortic aneurysm using the Gore-C3 device   Josephina Gip, MD 12/11/2012 7:38 AM

## 2012-12-12 LAB — TYPE AND SCREEN

## 2012-12-12 LAB — BASIC METABOLIC PANEL
BUN: 10 mg/dL (ref 6–23)
CO2: 29 mEq/L (ref 19–32)
Chloride: 101 mEq/L (ref 96–112)
Creatinine, Ser: 1.35 mg/dL (ref 0.50–1.35)
Glucose, Bld: 120 mg/dL — ABNORMAL HIGH (ref 70–99)
Potassium: 4 mEq/L (ref 3.5–5.1)

## 2012-12-12 LAB — CBC
HCT: 35.1 % — ABNORMAL LOW (ref 39.0–52.0)
Hemoglobin: 11.9 g/dL — ABNORMAL LOW (ref 13.0–17.0)
MCV: 85.8 fL (ref 78.0–100.0)
RBC: 4.09 MIL/uL — ABNORMAL LOW (ref 4.22–5.81)
RDW: 13.7 % (ref 11.5–15.5)
WBC: 11.1 10*3/uL — ABNORMAL HIGH (ref 4.0–10.5)

## 2012-12-12 NOTE — Progress Notes (Signed)
Pt voiding, eating and ambulating without difficulty.  Discharge instructions given to pt and family.  All verbalized understanding with all questions answered.  Pt discharged to home with wife and son.  Roselie Awkward, RN

## 2012-12-12 NOTE — Discharge Summary (Signed)
[facility] Discharge Summary   Patient ID:  Paul Sandoval. MRN: [id number] DOB/AGE: [birth year] 72 y.o.  Admit date: 12/11/2012 Discharge date: 12/12/2012 Date of Surgery: 12/11/2012 Surgeon: Surgeon(s): Pryor Ochoa, MD Nada Libman, MD  Admission Diagnosis: Abdominal Aortic Aneurysm  Discharge Diagnoses:  Abdominal Aortic Aneurysm  Secondary Diagnoses: Past Medical History  Diagnosis Date  . 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 AAA (abdominal aortic aneurysm) 2010    4.4 cm 08/2008;4.44 in 7/10 and 4.65 in 08/2009; 4.8 by CT in 11/2009; 4.3 by ultrasound in 08/2010  . Hypertension   . Hyperlipidemia   . Tobacco abuse, in remission     40 pack year total consumption; discontinued in 1996  . GERD (gastroesophageal reflux disease)   . Right bundle branch block   . Diverticulosis   . Colonic polyp 2002    polypectomy in 2002  . Arthritis   . COPD (chronic obstructive pulmonary disease)   . Pneumonia     few yrs. ago    Procedure(s): ABDOMINAL AORTIC ENDOVASCULAR STENT GRAFT 1 bilateral common femoral artery access using percutaneous approach with bilateral femoral artery repair using Pro-glide closure device x4  #2 insertion aorto by common iliac stent graft Gore -C3 using  A- 23 x 12 x 16 cm main body via a right  B- 12 mm x 14 cm contralateral limb-left  #3 completion angiography  #4 balloon angioplasty left common iliac artery stenosis using 10 mm x 4 cm-Mustang balloon with single inflation at 10 atmospheres for 30 seconds  Anesthesia: General  Discharged Condition: good  HPI:  Paul Sandoval. is a 72 y.o. male who presents for evaluation of abdominal aortic aneurysm. This had been followed for many years. Patient denies any abdominal or back symptoms. He was last seen by [place] a few months ago an ultrasound revealed a 4.8 cm aneurysm. Previously the aneurysm size had been around 5.1 cm. Today he had a CT  angiogram which I have reviewed and interpreted and reveals that the aneurysm has now enlarged to 5.3 cm. He does appear to be a good stent graft candidate. Pt was admitted for Aortic stent grafting of abdominal aortic aneurysm using the Gore-C3 device  Hospital Course:  Paul Sandoval. is a 72 y.o. male is S/P  ABDOMINAL AORTIC ENDOVASCULAR STENT GRAFT Extubated: POD # 0 Physical exam: abd soft with NABS and flatus Post-op wounds healing well without hematoma or drainage Pt. Ambulating, voiding and taking PO diet without difficulty. Pt pain controlled with PO pain meds. Labs as below Complications:none  Consults:     Significant Diagnostic Studies: CBC Lab Results  Component Value Date   WBC 11.1* 12/12/2012   HGB 11.9* 12/12/2012   HCT 35.1* 12/12/2012   MCV 85.8 12/12/2012   PLT 98* 12/12/2012    BMET    Component Value Date/Time   NA 137 12/12/2012 0415   K 4.0 12/12/2012 0415   CL 101 12/12/2012 0415   CO2 29 12/12/2012 0415   GLUCOSE 120* 12/12/2012 0415   BUN 10 12/12/2012 0415   CREATININE 1.35 12/12/2012 0415   CREATININE 1.61* 10/28/2012 1010   CALCIUM 8.6 12/12/2012 0415   GFRNONAA 50* 12/12/2012 0415   GFRAA 59* 12/12/2012 0415   COAG Lab Results  Component Value Date   INR 1.22 12/11/2012   INR 0.97 12/03/2012     Disposition:  Discharge to :Home Discharge Orders   Future Appointments  Provider Department Dept Phone   01/13/2013 2:45 PM Pryor Ochoa, MD [facility] [phone]   01/23/2013 12:15 PM [facility] [phone]   Future Orders Complete By Expires     ABDOMINAL PROCEDURE/ANEURYSM REPAIR/AORTO-BIFEMORAL BYPASS:  Call MD for increased abdominal pain; cramping diarrhea; nausea/vomiting  As directed     Call MD for:  redness, tenderness, or signs of infection (pain, swelling, bleeding, redness, odor or green/yellow discharge around incision site)  As directed     Call MD for:  severe or increased pain,  loss or decreased feeling  in affected limb(s)  As directed     Call MD for:  temperature >100.5  As directed     Driving Restrictions  As directed     Comments:      No driving for 4 weeks    Increase activity slowly  As directed     Comments:      Walk with assistance use walker or cane as needed    Lifting restrictions  As directed     Comments:      No heavy lifting for 6 weeks    May shower   As directed     Scheduling Instructions:      Saturday    Resume previous diet  As directed         Medication List    TAKE these medications       acetaminophen 500 MG tablet  Commonly known as:  TYLENOL  Take 1,000 mg by mouth every 6 (six) hours as needed for pain.     albuterol 108 (90 BASE) MCG/ACT inhaler  Commonly known as:  PROVENTIL HFA;VENTOLIN HFA  Inhale 2 puffs into the lungs every 6 (six) hours as needed for wheezing.     aspirin EC 81 MG tablet  Take 81 mg by mouth at bedtime.     enalapril 10 MG tablet  Commonly known as:  VASOTEC  Take 15 mg by mouth 2 (two) times daily.     hydrochlorothiazide 25 MG tablet  Commonly known as:  HYDRODIURIL  Take 12.5 mg by mouth daily.     metoprolol 50 MG tablet  Commonly known as:  LOPRESSOR  Take 50 mg by mouth 2 (two) times daily.     omeprazole 20 MG capsule  Commonly known as:  PRILOSEC  Take 20 mg by mouth daily.     oxyCODONE-acetaminophen 5-325 MG per tablet  Commonly known as:  ROXICET  Take 1 tablet by mouth every 4 (four) hours as needed for pain.     simvastatin 80 MG tablet  Commonly known as:  ZOCOR  Take 40 mg by mouth at bedtime.       Verbal and written Discharge instructions given to the patient. Wound care per Discharge AVS     Follow-up Information   Follow up with Josephina Gip, MD In 4 weeks. (office Paul arrange-sent)    Contact information:   [address] [place] [address] [phone]       Signed: Marlowe Shores 12/12/2012, 7:47 AM  - For VQI Registry use  --- Instructions: Press F2 to tab through selections.  Delete question if not applicable.   Post-op:  Time to Extubation: [x ] In OR, [ ]  < 12 hrs, [ ]  12-24 hrs, [ ]  >=24 hrs Vasopressors Req. Post-op: No MI: [x ] No, [ ]  Troponin only, [ ]  EKG or Clinical New Arrhythmia: No CHF: No ICU  Stay: 0 days Transfusion: No  If yes,  units given  Complications: Resp failure: [x ] none, [ ]  Pneumonia, [ ]  Ventilator Chg in renal function: [x ] none, [ ]  Inc. Cr > 0.5, [ ]  Temp. Dialysis, [ ]  Permanent dialysis Leg ischemia: [x ] No, [ ]  Yes, no Surgery needed, [ ]  Yes, Surgery needed, [ ]  Amputation Bowel ischemia: [x ] No, [ ]  Medical Rx, [ ]  Surgical Rx Wound complication: [x ] No, [ ]  Superficial separation/infection, [ ]  Return to OR Return to OR: No  Return to OR for bleeding: No Stroke: [x ] None, [ ]  Minor, [ ]  Major  Discharge medications: Statin use:  Yes If No: [ ]  For Medical reasons, [ ]  Non-compliant ASA use:  Yes  If No: [ ]  For Medical reasons, [ ]  Non-compliant Plavix use:  No If No: [ ]  For Medical reasons, [ ]  Non-compliant Beta blocker use:  Yes If No: [ ]  For Medical reasons, [ ]  Non-compliant

## 2012-12-12 NOTE — Progress Notes (Addendum)
[facility] [place]  Post-op EVAR Date of Surgery: 12/11/2012 Surgeon: Surgeon(s): Pryor Ochoa, MD Nada Libman, MD POD: 1 Day Post-Op Device: insertion aorto by common iliac stent graft Gore -C3 using  A- 23 x 12 x 16 cm main body via a right  B- 12 mm x 14 cm contralateral limb-left  History of Present Illness  [name] is a 72 y.o. male who is s/p EVAR. The patient denies back pain; denies abdominal pain; denies lower extremity pain.  He is Ambulating and taking PO well without nausea or vomiting. Pt. has voided with foley out  IMAGING: Dg Chest Portable 1 View  12/11/2012  *RADIOLOGY REPORT*  Clinical Data: Stent graft placement.  PORTABLE CHEST - 1 VIEW  Comparison: 03/31/2012.  Findings: 1026 hours.  Right IJ sheath is in place.  The heart size and mediastinal contours are stable status post CABG.  There is minimal left basilar atelectasis.  No airspace disease, pleural effusion or pneumothorax is identified.  IMPRESSION: Minimal left basilar atelectasis.  No acute cardiopulmonary process.   Original Report Authenticated By: Carey Bullocks, M.D.    Dg Abd Portable 1v  12/11/2012  *RADIOLOGY REPORT*  Clinical Data: Stent graft placement.  PORTABLE ABDOMEN - 1 VIEW  Comparison: CT 11/04/2012.  Findings: The patient has undergone interval aortobi-iliac stent graft placement.  There is transitional lumbosacral anatomy; assuming the transitional segment is L5 and that there are small ribs at T12, the stent graft extends superiorly to the T12-L1 disc space level which is near the origin of the renal arteries on the prior CTA.  Some contrast material is present within the renal collecting systems bilaterally and the bladder.  There is no hydronephrosis.  Visualized bowel gas pattern is normal.  IMPRESSION: Aortoiliac stent graft placement as described.   Original Report Authenticated By: Carey Bullocks, M.D.     Significant Diagnostic Studies: CBC Lab  Results  Component Value Date   WBC 11.1* 12/12/2012   HGB 11.9* 12/12/2012   HCT 35.1* 12/12/2012   MCV 85.8 12/12/2012   PLT 98* 12/12/2012     BMET    Component Value Date/Time   NA 137 12/12/2012 0415   K 4.0 12/12/2012 0415   CL 101 12/12/2012 0415   CO2 29 12/12/2012 0415   GLUCOSE 120* 12/12/2012 0415   BUN 10 12/12/2012 0415   CREATININE 1.35 12/12/2012 0415   CREATININE 1.61* 10/28/2012 1010   CALCIUM 8.6 12/12/2012 0415   GFRNONAA 50* 12/12/2012 0415   GFRAA 59* 12/12/2012 0415    COAG Lab Results  Component Value Date   INR 1.22 12/11/2012   INR 0.97 12/03/2012   No results found for this basename: PTT     I/O last 3 completed shifts: In: 4185 [P.O.:480; I.V.:3655; IV Piggyback:50] Out: 3575 [Urine:3425; Blood:150] No data found.   Physical Examination  BP Readings from Last 3 Encounters:  12/12/12 129/52  12/12/12 129/52  12/04/12 112/52   Temp Readings from Last 3 Encounters:  12/12/12 98.6 F (37 C) Oral  12/12/12 98.6 F (37 C) Oral  12/03/12 98 F (36.7 C)    SpO2 Readings from Last 3 Encounters:  12/12/12 95%  12/12/12 95%  12/04/12 95%   Pulse Readings from Last 3 Encounters:  12/12/12 84  12/12/12 84  12/04/12 86    General: A&O x 3, WDWN male in NAD Gait: Normal Pulmonary: normal non-labored breathing  Cardiac: RRR Abdomen: soft, NT, NABS Bilateral groin  wounds: clean, dry, intact, without hematoma Vascular Exam/Pulses:2+ DP pulses bilat Extremities without ischemic changes, no Gangrene , no cellulitis; no open wounds;   Neurologic: A&O X 3; Appropriate Affect  Assessment: Paul Sandoval. is a 72 y.o. male who is 1 Day Post-Op EVAR.  Pt is doing well with no complaints CRI - CR and GFR stable post-op  Plan: Home  The importance of surveillance of the endograft was discussed with the patient  A CTA of abdomen and pelvis will be scheduled for one month to assess for endoleak.  The patient will follow up with [place] in one month  with these studies.   SignedMarlowe Shores [phone] 12/12/2012 7:44 AM.   No hematoma No abdomen or back pain Hgb 12 Cr 1.6 Will d/c home today  Fabienne Bruns, MD [facility] [place] Office: [phone] Pager: [phone]

## 2012-12-16 ENCOUNTER — Encounter (HOSPITAL_COMMUNITY): Payer: Self-pay | Admitting: Vascular Surgery

## 2012-12-16 ENCOUNTER — Ambulatory Visit: Payer: Medicare Other | Admitting: Cardiology

## 2012-12-24 ENCOUNTER — Ambulatory Visit: Payer: Medicare Other | Admitting: Cardiology

## 2013-01-12 ENCOUNTER — Encounter: Payer: Self-pay | Admitting: Vascular Surgery

## 2013-01-13 ENCOUNTER — Ambulatory Visit (INDEPENDENT_AMBULATORY_CARE_PROVIDER_SITE_OTHER): Payer: Medicare Other | Admitting: Vascular Surgery

## 2013-01-13 ENCOUNTER — Other Ambulatory Visit: Payer: Medicare Other

## 2013-01-13 ENCOUNTER — Ambulatory Visit: Payer: Medicare Other | Admitting: Vascular Surgery

## 2013-01-13 ENCOUNTER — Ambulatory Visit
Admission: RE | Admit: 2013-01-13 | Discharge: 2013-01-13 | Disposition: A | Discharge status: Home / Self Care | Payer: Medicare Other | Source: Ambulatory Visit | Attending: Vascular Surgery | Admitting: Vascular Surgery

## 2013-01-13 ENCOUNTER — Encounter: Payer: Self-pay | Admitting: Vascular Surgery

## 2013-01-13 VITALS — BP 136/67 | HR 58 | Resp 16 | Ht 70.0 in | Wt 194.0 lb

## 2013-01-13 DIAGNOSIS — I7771 Dissection of carotid artery: Secondary | ICD-10-CM

## 2013-01-13 DIAGNOSIS — M25559 Pain in unspecified hip: Secondary | ICD-10-CM

## 2013-01-13 DIAGNOSIS — Z48812 Encounter for surgical aftercare following surgery on the circulatory system: Secondary | ICD-10-CM | POA: Insufficient documentation

## 2013-01-13 DIAGNOSIS — I714 Abdominal aortic aneurysm, without rupture: Secondary | ICD-10-CM

## 2013-01-13 DIAGNOSIS — M25552 Pain in left hip: Secondary | ICD-10-CM

## 2013-01-13 MED ORDER — IOHEXOL 350 MG/ML SOLN
100.0000 mL | Freq: Once | INTRAVENOUS | Status: AC | PRN
  Administered 2013-01-13: 100 mL via INTRAVENOUS

## 2013-01-13 NOTE — Progress Notes (Signed)
Subjective:     Patient ID: Paul Royalty., male   DOB: 10/25/1940, 72 y.o.   MRN: [id number]  HPI this 72 year old male returns 1 month post aortic stent graft repair of abdominal aortic aneurysm. He has no complaints other than he develops a "catch" in his left hip when he first arises. This is then not bother him and he is able to ambulate for 2 miles. He has no abdominal or back pain.  Past Medical History  Diagnosis Date  . 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 AAA (abdominal aortic aneurysm) 2010    4.4 cm 08/2008;4.44 in 7/10 and 4.65 in 08/2009; 4.8 by CT in 11/2009; 4.3 by ultrasound in 08/2010  . Hypertension   . Hyperlipidemia   . Tobacco abuse, in remission     40 pack year total consumption; discontinued in 1996  . GERD (gastroesophageal reflux disease)   . Right bundle branch block   . Diverticulosis   . Colonic polyp 2002    polypectomy in 2002  . Arthritis   . COPD (chronic obstructive pulmonary disease)   . Pneumonia     few yrs. ago    History  Substance Use Topics  . Smoking status: Former Smoker -- 1.50 packs/day for 30 years    Types: Cigarettes    Quit date: 01/08/1995  . Smokeless tobacco: Never Used  . Alcohol Use: No    Family History  Problem Relation Age of Onset  . Colon cancer Neg Hx   . Colon polyps Neg Hx   . Heart disease Father   . Arthritis Mother   . Parkinsonism Mother   . Arthritis Sister     Brother with rheumatoid arthritis  . Cancer Father     Lung  . Hypertension Brother     No Known Allergies  Current outpatient prescriptions:acetaminophen (TYLENOL) 500 MG tablet, Take 1,000 mg by mouth every 6 (six) hours as needed for pain., Disp: , Rfl: ;  albuterol (PROVENTIL HFA;VENTOLIN HFA) 108 (90 BASE) MCG/ACT inhaler, Inhale 2 puffs into the lungs every 6 (six) hours as needed for wheezing., Disp: , Rfl: ;  aspirin EC 81 MG tablet, Take 81 mg by mouth at bedtime., Disp: , Rfl:  enalapril (VASOTEC) 10  MG tablet, Take 15 mg by mouth 2 (two) times daily. , Disp: , Rfl: ;  hydrochlorothiazide 25 MG tablet, Take 12.5 mg by mouth daily. , Disp: , Rfl: ;  metoprolol (LOPRESSOR) 50 MG tablet, Take 50 mg by mouth 2 (two) times daily. , Disp: , Rfl: ;  omeprazole (PRILOSEC) 20 MG capsule, Take 20 mg by mouth daily.  , Disp: , Rfl:  oxyCODONE-acetaminophen (ROXICET) 5-325 MG per tablet, Take 1 tablet by mouth every 4 (four) hours as needed for pain., Disp: 30 tablet, Rfl: 0;  simvastatin (ZOCOR) 80 MG tablet, Take 40 mg by mouth at bedtime., Disp: , Rfl:   BP 136/67  Pulse 58  Resp 16  Ht 5\' 10"  (1.778 m)  Wt 194 lb (87.998 kg)  BMI 27.84 kg/m2  SpO2 98%  Body mass index is 27.84 kg/(m^2).           Review of Systems     Objective:   Physical Exam BP 136/67  Pulse 58  Resp 16  Ht 5\' 10"  (1.778 m)  Wt 194 lb (87.998 kg)  BMI 27.84 kg/m2  SpO2 98%  General well-developed well-nourished male in no apparent stress alert and oriented x3  HEENT no carotid bruits-normal for age Lungs no rhonchi or wheezing Abdomen soft nontender no pulsatile mass 3+ femoral and dorsalis pedis pulses palpable bilaterally  Today ordered a CT angiogram chart reviewed by the computer. There is no evidence of endoleak. Stent graft is in excellent position.     Assessment:     Successful stent graft placement for abdominal aortic aneurysm with no evidence of endoleak History of mild carotid occlusive disease-patient would like is followed in our office    Plan:     1 return in 6 months with CT angiogram of the abdomen and pelvis to follow aortic stent graft #2 return in 6 months with carotid duplex exam

## 2013-01-14 ENCOUNTER — Telehealth: Payer: Self-pay | Admitting: Family Medicine

## 2013-01-14 NOTE — Addendum Note (Signed)
Addended by: Sharee Pimple on: 01/14/2013 09:15 AM   Modules accepted: Orders

## 2013-01-14 NOTE — Telephone Encounter (Signed)
Patient needs BW paperwork °

## 2013-01-15 ENCOUNTER — Other Ambulatory Visit: Payer: Medicare Other

## 2013-01-15 ENCOUNTER — Other Ambulatory Visit: Payer: Self-pay

## 2013-01-15 DIAGNOSIS — Z125 Encounter for screening for malignant neoplasm of prostate: Secondary | ICD-10-CM

## 2013-01-15 DIAGNOSIS — Z79899 Other long term (current) drug therapy: Secondary | ICD-10-CM

## 2013-01-15 DIAGNOSIS — E785 Hyperlipidemia, unspecified: Secondary | ICD-10-CM

## 2013-01-15 NOTE — Telephone Encounter (Signed)
Ordered Lipid, Liver, BMP and PSA per Dr. Roby Lofts protocol. Left message on answering machine notifying patient that blood papers are ready for pick up upfront.

## 2013-01-16 LAB — HEPATIC FUNCTION PANEL
AST: 16 U/L (ref 0–37)
Albumin: 4 g/dL (ref 3.5–5.2)
Alkaline Phosphatase: 78 U/L (ref 39–117)
Bilirubin, Direct: 0.2 mg/dL (ref 0.0–0.3)
Total Bilirubin: 0.8 mg/dL (ref 0.3–1.2)

## 2013-01-16 LAB — BASIC METABOLIC PANEL
Calcium: 9.3 mg/dL (ref 8.4–10.5)
Potassium: 5.1 mEq/L (ref 3.5–5.3)
Sodium: 141 mEq/L (ref 135–145)

## 2013-01-16 LAB — LIPID PANEL
HDL: 29 mg/dL — ABNORMAL LOW (ref >39)
LDL Cholesterol: 75 mg/dL (ref 0–99)
Total CHOL/HDL Ratio: 4.5 Ratio
VLDL: 27 mg/dL (ref 0–40)

## 2013-01-19 ENCOUNTER — Encounter (HOSPITAL_COMMUNITY): Payer: Self-pay | Admitting: Emergency Medicine

## 2013-01-19 ENCOUNTER — Emergency Department (HOSPITAL_COMMUNITY): Payer: Medicare Other

## 2013-01-19 ENCOUNTER — Inpatient Hospital Stay (HOSPITAL_COMMUNITY)
Admission: EM | Admit: 2013-01-19 | Discharge: 2013-01-24 | DRG: 470 | Disposition: A | Discharge status: Home / Self Care | Payer: Medicare Other | Attending: Internal Medicine | Admitting: Internal Medicine

## 2013-01-19 ENCOUNTER — Inpatient Hospital Stay (HOSPITAL_COMMUNITY): Payer: Medicare Other

## 2013-01-19 DIAGNOSIS — Z87891 Personal history of nicotine dependence: Secondary | ICD-10-CM

## 2013-01-19 DIAGNOSIS — J4489 Other specified chronic obstructive pulmonary disease: Secondary | ICD-10-CM | POA: Diagnosis present

## 2013-01-19 DIAGNOSIS — S72002A Fracture of unspecified part of neck of left femur, initial encounter for closed fracture: Secondary | ICD-10-CM

## 2013-01-19 DIAGNOSIS — I10 Essential (primary) hypertension: Secondary | ICD-10-CM | POA: Diagnosis present

## 2013-01-19 DIAGNOSIS — I2510 Atherosclerotic heart disease of native coronary artery without angina pectoris: Secondary | ICD-10-CM | POA: Diagnosis present

## 2013-01-19 DIAGNOSIS — Z79899 Other long term (current) drug therapy: Secondary | ICD-10-CM

## 2013-01-19 DIAGNOSIS — E785 Hyperlipidemia, unspecified: Secondary | ICD-10-CM | POA: Diagnosis present

## 2013-01-19 DIAGNOSIS — M129 Arthropathy, unspecified: Secondary | ICD-10-CM | POA: Diagnosis present

## 2013-01-19 DIAGNOSIS — I4510 Unspecified right bundle-branch block: Secondary | ICD-10-CM | POA: Diagnosis present

## 2013-01-19 DIAGNOSIS — M84453A Pathological fracture, unspecified femur, initial encounter for fracture: Secondary | ICD-10-CM | POA: Diagnosis present

## 2013-01-19 DIAGNOSIS — S72009A Fracture of unspecified part of neck of unspecified femur, initial encounter for closed fracture: Secondary | ICD-10-CM

## 2013-01-19 DIAGNOSIS — I714 Abdominal aortic aneurysm, without rupture: Secondary | ICD-10-CM

## 2013-01-19 DIAGNOSIS — M810 Age-related osteoporosis without current pathological fracture: Secondary | ICD-10-CM | POA: Diagnosis present

## 2013-01-19 DIAGNOSIS — M87059 Idiopathic aseptic necrosis of unspecified femur: Principal | ICD-10-CM | POA: Diagnosis present

## 2013-01-19 DIAGNOSIS — M25552 Pain in left hip: Secondary | ICD-10-CM

## 2013-01-19 DIAGNOSIS — I679 Cerebrovascular disease, unspecified: Secondary | ICD-10-CM | POA: Diagnosis present

## 2013-01-19 DIAGNOSIS — Z951 Presence of aortocoronary bypass graft: Secondary | ICD-10-CM

## 2013-01-19 DIAGNOSIS — J449 Chronic obstructive pulmonary disease, unspecified: Secondary | ICD-10-CM | POA: Diagnosis present

## 2013-01-19 DIAGNOSIS — Z7982 Long term (current) use of aspirin: Secondary | ICD-10-CM

## 2013-01-19 DIAGNOSIS — K219 Gastro-esophageal reflux disease without esophagitis: Secondary | ICD-10-CM | POA: Diagnosis present

## 2013-01-19 LAB — POCT I-STAT, CHEM 8
Calcium, Ion: 1.21 mmol/L (ref 1.13–1.30)
Chloride: 102 mEq/L (ref 96–112)
Creatinine, Ser: 1.4 mg/dL — ABNORMAL HIGH (ref 0.50–1.35)
Glucose, Bld: 95 mg/dL (ref 70–99)
HCT: 38 % — ABNORMAL LOW (ref 39.0–52.0)
Hemoglobin: 12.9 g/dL — ABNORMAL LOW (ref 13.0–17.0)
Potassium: 4.2 mEq/L (ref 3.5–5.1)

## 2013-01-19 LAB — SURGICAL PCR SCREEN: Staphylococcus aureus: NEGATIVE

## 2013-01-19 LAB — URINALYSIS, ROUTINE W REFLEX MICROSCOPIC
Bilirubin Urine: NEGATIVE
Hgb urine dipstick: NEGATIVE
Nitrite: NEGATIVE
Specific Gravity, Urine: 1.018 (ref 1.005–1.030)
Urobilinogen, UA: 0.2 mg/dL (ref 0.0–1.0)
pH: 6 (ref 5.0–8.0)

## 2013-01-19 LAB — TYPE AND SCREEN: Antibody Screen: NEGATIVE

## 2013-01-19 LAB — PROTIME-INR: Prothrombin Time: 13.3 seconds (ref 11.6–15.2)

## 2013-01-19 LAB — GLUCOSE, CAPILLARY: Glucose-Capillary: 97 mg/dL (ref 70–99)

## 2013-01-19 MED ORDER — ALBUTEROL SULFATE HFA 108 (90 BASE) MCG/ACT IN AERS: 2.0000 | INHALATION_SPRAY | Freq: Four times a day (QID) | RESPIRATORY_TRACT | Status: DC | PRN

## 2013-01-19 MED ORDER — PANTOPRAZOLE SODIUM 40 MG PO TBEC
40.0000 mg | DELAYED_RELEASE_TABLET | Freq: Every day | ORAL | Status: DC
  Administered 2013-01-20 – 2013-01-24 (×5): 40 mg via ORAL
  Filled 2013-01-19 (×6): qty 1

## 2013-01-19 MED ORDER — HYDROCODONE-ACETAMINOPHEN 5-325 MG PO TABS
1.0000 | ORAL_TABLET | Freq: Four times a day (QID) | ORAL | Status: DC | PRN
  Administered 2013-01-19 – 2013-01-20 (×3): 2 via ORAL
  Filled 2013-01-19 (×3): qty 2

## 2013-01-19 MED ORDER — ATORVASTATIN CALCIUM 40 MG PO TABS
40.0000 mg | ORAL_TABLET | Freq: Every day | ORAL | Status: DC
  Administered 2013-01-19 – 2013-01-23 (×4): 40 mg via ORAL
  Filled 2013-01-19 (×6): qty 1

## 2013-01-19 MED ORDER — KETOROLAC TROMETHAMINE 30 MG/ML IJ SOLN
30.0000 mg | INTRAMUSCULAR | Status: AC
  Administered 2013-01-19: 30 mg via INTRAVENOUS
  Filled 2013-01-19: qty 1

## 2013-01-19 MED ORDER — KETOROLAC TROMETHAMINE 15 MG/ML IJ SOLN
30.0000 mg | INTRAMUSCULAR | Status: DC
  Filled 2013-01-19: qty 2

## 2013-01-19 MED ORDER — ENALAPRIL MALEATE 5 MG PO TABS
15.0000 mg | ORAL_TABLET | Freq: Two times a day (BID) | ORAL | Status: DC
  Administered 2013-01-19 – 2013-01-24 (×8): 15 mg via ORAL
  Filled 2013-01-19 (×11): qty 1

## 2013-01-19 MED ORDER — ASPIRIN EC 81 MG PO TBEC
81.0000 mg | DELAYED_RELEASE_TABLET | Freq: Every day | ORAL | Status: DC
  Administered 2013-01-19 – 2013-01-20 (×2): 81 mg via ORAL
  Filled 2013-01-19 (×3): qty 1

## 2013-01-19 MED ORDER — METOPROLOL TARTRATE 50 MG PO TABS
50.0000 mg | ORAL_TABLET | Freq: Two times a day (BID) | ORAL | Status: DC
  Administered 2013-01-19 – 2013-01-23 (×7): 50 mg via ORAL
  Filled 2013-01-19 (×9): qty 1

## 2013-01-19 MED ORDER — MORPHINE SULFATE 2 MG/ML IJ SOLN
0.5000 mg | INTRAMUSCULAR | Status: DC | PRN
  Administered 2013-01-22 (×2): 0.5 mg via INTRAVENOUS
  Filled 2013-01-19 (×2): qty 1

## 2013-01-19 MED ORDER — SODIUM CHLORIDE 0.9 % IV BOLUS (SEPSIS)
1000.0000 mL | Freq: Once | INTRAVENOUS | Status: AC
  Administered 2013-01-19: 1000 mL via INTRAVENOUS

## 2013-01-19 MED ORDER — IOHEXOL 350 MG/ML SOLN
100.0000 mL | Freq: Once | INTRAVENOUS | Status: AC | PRN
  Administered 2013-01-19: 100 mL via INTRAVENOUS

## 2013-01-19 MED ORDER — SODIUM CHLORIDE 0.9 % IV SOLN: INTRAVENOUS | Status: DC

## 2013-01-19 NOTE — Consult Note (Signed)
Reason for Consult:  Left hip pain Referring Physician:  Dr. Margaretha Seeds. is an 72 y.o. male.  HPI:  72 y/o male with PMH of AAA c/o left hip pain for "years".  He says the last 24 hours it was particularly worse.  He says it was too bad this morning to bear weight.  He presented to the ER for evaluation, and I'm asked to consult.  He says the pain is sharp in the groin and worse with motion and weight bearing.  Better with rest and keeping still.  No h/o diabetes or recent smoking.  He has had oral steroids at least twice in the last few years.  He denies numbness, tingling or weakness in the left lower extremity.  Dr. Hart Rochester repaired his AAA endovascularly earlier this year.  He takes aspirin but no other blood thinners.  Past Medical History  Diagnosis Date  . 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 AAA (abdominal aortic aneurysm) 2010    4.4 cm 08/2008;4.44 in 7/10 and 4.65 in 08/2009; 4.8 by CT in 11/2009; 4.3 by ultrasound in 08/2010  . Hypertension   . Hyperlipidemia   . Tobacco abuse, in remission     40 pack year total consumption; discontinued in 1996  . GERD (gastroesophageal reflux disease)   . Right bundle branch block   . Diverticulosis   . Colonic polyp 2002    polypectomy in 2002  . Arthritis   . COPD (chronic obstructive pulmonary disease)   . Pneumonia     few yrs. ago    Past Surgical History  Procedure Laterality Date  . Colonoscopy  2002    polypectomy-patient denies  . Laparoscopic cholecystectomy  12/2009  . 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 Abdominal aortic endovascular stent graft N/A 12/11/2012    Procedure: ABDOMINAL AORTIC ENDOVASCULAR STENT GRAFT;  Surgeon: Pryor Ochoa, MD;  Location: [facility] OR;  Service: Vascular;  Laterality: N/A;  Ultrasound guided; Gore  . Abdominal aortic aneurysm repair      Family History  Problem Relation Age of Onset  . Colon cancer Neg Hx   . Colon polyps Neg Hx   .  Heart disease Father   . Arthritis Mother   . Parkinsonism Mother   . Arthritis Sister     Brother with rheumatoid arthritis  . Cancer Father     Lung  . Hypertension Brother     Social History:  reports that he quit smoking about 18 years ago. His smoking use included Cigarettes. He has a 45 pack-year smoking history. He has never used smokeless tobacco. He reports that he does not drink alcohol or use illicit drugs.  Allergies: No Known Allergies  Medications: I have reviewed the patient's current medications.  Results for orders placed during the hospital encounter of 01/19/13 (from the past 48 hour(s))  POCT I-STAT, CHEM 8     Status: Abnormal   Collection Time    01/19/13  5:08 PM      Result Value Range   Sodium 139  135 - 145 mEq/L   Potassium 4.2  3.5 - 5.1 mEq/L   Chloride 102  96 - 112 mEq/L   BUN 17  6 - 23 mg/dL   Creatinine, Ser 1.61 (*) 0.50 - 1.35 mg/dL   Glucose, Bld 95  70 - 99 mg/dL   Calcium, Ion 0.96  0.45 - 1.30 mmol/L   TCO2 30  0 -  100 mmol/L   Hemoglobin 12.9 (*) 13.0 - 17.0 g/dL   HCT 98.1 (*) 19.1 - 47.8 %  URINALYSIS, ROUTINE W REFLEX MICROSCOPIC     Status: None   Collection Time    01/19/13  8:37 PM      Result Value Range   Color, Urine YELLOW  YELLOW   APPearance CLEAR  CLEAR   Specific Gravity, Urine 1.018  1.005 - 1.030   pH 6.0  5.0 - 8.0   Glucose, UA NEGATIVE  NEGATIVE mg/dL   Hgb urine dipstick NEGATIVE  NEGATIVE   Bilirubin Urine NEGATIVE  NEGATIVE   Ketones, ur NEGATIVE  NEGATIVE mg/dL   Protein, ur NEGATIVE  NEGATIVE mg/dL   Urobilinogen, UA 0.2  0.0 - 1.0 mg/dL   Nitrite NEGATIVE  NEGATIVE   Leukocytes, UA NEGATIVE  NEGATIVE   Comment: MICROSCOPIC NOT DONE ON URINES WITH NEGATIVE PROTEIN, BLOOD, LEUKOCYTES, NITRITE, OR GLUCOSE <1000 mg/dL.  GLUCOSE, CAPILLARY     Status: None   Collection Time    01/19/13  8:47 PM      Result Value Range   Glucose-Capillary 97  70 - 99 mg/dL   Comment 1 Notify RN    PROTIME-INR     Status:  None   Collection Time    01/19/13  8:48 PM      Result Value Range   Prothrombin Time 13.3  11.6 - 15.2 seconds   INR 1.02  0.00 - 1.49  TYPE AND SCREEN     Status: None   Collection Time    01/19/13  8:50 PM      Result Value Range   ABO/RH(D) O POS     Antibody Screen NEG     Sample Expiration 01/22/2013      Dg Hip Complete Left  01/19/2013   *RADIOLOGY REPORT*  Clinical Data: Hip pain.  No known trauma.  Unable to bear weight on left lower extremity.  LEFT HIP - COMPLETE 2+ VIEW  Comparison: CT of the abdomen and pelvis 01/13/2013  Findings: No evidence for acute fracture or subluxation.  Patient has had aorto iliac stent placement.  Regional bowel gas pattern is nonobstructive.  IMPRESSION: Negative exam.   Original Report Authenticated By: Norva Pavlov, M.D.   Dg Chest Port 1 View  01/19/2013   *RADIOLOGY REPORT*  Clinical Data: Check for infiltrates.  Preop.  PORTABLE CHEST - 1 VIEW  Comparison: 12/11/2012  Findings: The patient has had median sternotomy CABG.  Heart is normal in size.  No focal consolidations or pleural effusions.  No pulmonary edema. Degenerative changes are seen in the spine.  IMPRESSION: No evidence for acute cardiopulmonary abnormality.   Original Report Authenticated By: Norva Pavlov, M.D.   Dg Knee Complete 4 Views Left  01/19/2013   *RADIOLOGY REPORT*  Clinical Data: Left hip pain.  Pain in the hip and knee.  No known trauma.  LEFT KNEE - COMPLETE 4+ VIEW  Comparison: None.  Findings: No acute fracture or subluxation. No joint effusion. There is atherosclerotic calcification of the popliteal artery. No radiopaque foreign body or soft tissue gas.  IMPRESSION: No evidence for acute  abnormality.   Original Report Authenticated By: Norva Pavlov, M.D.   Ct Cta Abd/pel W/cm &/or W/o Cm  01/19/2013   *RADIOLOGY REPORT*  Clinical Data:  Evaluate recent abdominal aortic aneurysm repair. Hip pain.  CT ANGIOGRAPHY ABDOMEN AND PELVIS  Technique:  Multidetector  CT imaging of the abdomen and pelvis was performed using the  standard protocol during bolus administration of intravenous contrast.  Multiplanar reconstructed images including MIPs were obtained and reviewed to evaluate the vascular anatomy.  Contrast: OMNIPAQUE IOHEXOL 350 MG/ML SOLN  Comparison:  CT 01/13/2013  Findings:  An infrarenal aortobiiliac endoluminal stent graft is again noted.  Graft is widely patent without evidence of Endo leak. The excluded aneurysm sac measures 51 by 49 mm not changed from 53 x 46 mm on prior.  The celiac trunk, SMA, and renal arteries are patent.  The IMA is sacrificed and back-fills.  The iliac arteries are normal.  Lung bases are clear.  No pericardial fluid.  No focal hepatic lesion.  Post cholecystectomy.  The pancreas, spleen, adrenal glands, kidneys are normal.  Kidneys enhance symmetrically.  The stomach contains a small hiatal hernia.  The small bowel is normal.  The appendix is normal.  Diverticula descending colon. Diverticula of the sigmoid colon without acute inflammation.  No free fluid the pelvis.  The bladder is distended.  Prostate gland is normal.  Review bone windows demonstrates no acute findings.  No acute findings of the right hip.  No evidence of hematoma.   Review of the MIP images confirms the above findings.  IMPRESSION:  1.  No complication following Endograft repair of abdominal aortic aneurysm. 2.  No acute findings of the right hip.  3.  Colonic diverticulosis without diverticulitis.   Original Report Authenticated By: Genevive Bi, M.D.    ROS:  As above.  No recent f/c/n/v/wt loss. PE:  Blood pressure 144/70, pulse 81, temperature 98.3 F (36.8 C), temperature source Oral, resp. rate 18, height 5\' 10"  (1.778 m), weight 87.4 kg (192 lb 10.9 oz), SpO2 95.00%. wn wd male in nad.  A and O.  Mood and affect normal.  EOMI.  Resp unlabored.  L hip with painful ROM.  + stinchfield test.  Pain with log roll.  ROM can't be accurately assessed  due to pain.  L LE with healthy and intact skin.  No lymphadenopathy.  1+ dp and pt pulses.  5/5 strength in PF and DF o the ankle.  Sens to LT normal throughout the foot.  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  The best treatment for him is elective total hip arthroplasty.  He needs PT for TTWB and follow up with Dr. Charlann Boxer in clinic to schedule surgery.  He understands the plan and agrees.  Toni Arthurs 01/19/2013, 10:10 PM

## 2013-01-19 NOTE — ED Provider Notes (Signed)
History     CSN: [id number]  Arrival date & time 01/19/13  1249   None     Chief Complaint  Patient presents with  . Hip Pain    left side    (Consider location/radiation/quality/duration/timing/severity/associated sxs/prior treatment) HPI Comments: 72 y.o. male who had aortic graft repair approx 6 weeks ago, who presents to the Er w/ the cc of left hip pain. He states that his left hip has been hurting for the past several months, however, worsening of pain this morning. He states that he is not having abdominal pain, n/v or chest pain with this. This pain is similar to prior episodes he has had but this is worsening of pain to the point that pt states he is unable to walk on his left hip.   Patient is a 72 y.o. male presenting with general illness. The history is provided by the patient and the EMS personnel.  Illness Location:  Left hip  Severity:  Mild Timing:  Constant Progression:  Worsening Chronicity:  Chronic Associated symptoms: no abdominal pain, no chest pain, no congestion, no diarrhea, no headaches, no nausea, no rash, no shortness of breath and no wheezing     Past Medical History  Diagnosis Date  . 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 AAA (abdominal aortic aneurysm) 2010    4.4 cm 08/2008;4.44 in 7/10 and 4.65 in 08/2009; 4.8 by CT in 11/2009; 4.3 by ultrasound in 08/2010  . Hypertension   . Hyperlipidemia   . Tobacco abuse, in remission     40 pack year total consumption; discontinued in 1996  . GERD (gastroesophageal reflux disease)   . Right bundle branch block   . Diverticulosis   . Colonic polyp 2002    polypectomy in 2002  . Arthritis   . COPD (chronic obstructive pulmonary disease)   . Pneumonia     few yrs. ago    Past Surgical History  Procedure Laterality Date  . Colonoscopy  2002    polypectomy-patient denies  . Laparoscopic cholecystectomy  12/2009  . 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 Abdominal  aortic endovascular stent graft N/A 12/11/2012    Procedure: ABDOMINAL AORTIC ENDOVASCULAR STENT GRAFT;  Surgeon: Pryor Ochoa, MD;  Location: [facility] OR;  Service: Vascular;  Laterality: N/A;  Ultrasound guided; Gore  . Abdominal aortic aneurysm repair      Family History  Problem Relation Age of Onset  . Colon cancer Neg Hx   . Colon polyps Neg Hx   . Heart disease Father   . Arthritis Mother   . Parkinsonism Mother   . Arthritis Sister     Brother with rheumatoid arthritis  . Cancer Father     Lung  . Hypertension Brother     History  Substance Use Topics  . Smoking status: Former Smoker -- 1.50 packs/day for 30 years    Types: Cigarettes    Quit date: 01/08/1995  . Smokeless tobacco: Never Used  . Alcohol Use: No      Review of Systems  Constitutional: Negative for chills and activity change.  HENT: Negative for congestion, drooling, mouth sores and neck pain.   Eyes: Negative for pain and discharge.  Respiratory: Negative for chest tightness, shortness of breath and wheezing.   Cardiovascular: Negative for chest pain.  Gastrointestinal: Negative for nausea, abdominal pain, diarrhea and constipation.  Genitourinary: Negative for dysuria, flank pain and difficulty urinating.  Musculoskeletal: Negative for back pain  and arthralgias.  Skin: Negative for color change, pallor and rash.  Neurological: Negative for dizziness, weakness, light-headedness and headaches.  Psychiatric/Behavioral: Negative for behavioral problems, confusion and agitation.    Allergies  Review of patient's allergies indicates no known allergies.  Home Medications   Current Outpatient Rx  Name  Route  Sig  Dispense  Refill  . acetaminophen (TYLENOL) 500 MG tablet   Oral   Take 1,000 mg by mouth every 6 (six) hours as needed for pain.         Marland Kitchen albuterol (PROVENTIL HFA;VENTOLIN HFA) 108 (90 BASE) MCG/ACT inhaler   Inhalation   Inhale 2 puffs into the lungs every 6 (six) hours as needed for  wheezing.         Marland Kitchen aspirin EC 81 MG tablet   Oral   Take 81 mg by mouth at bedtime.         . enalapril (VASOTEC) 10 MG tablet   Oral   Take 15 mg by mouth 2 (two) times daily.          . hydrochlorothiazide 25 MG tablet   Oral   Take 12.5 mg by mouth daily.          . metoprolol (LOPRESSOR) 50 MG tablet   Oral   Take 50 mg by mouth 2 (two) times daily.          Marland Kitchen omeprazole (PRILOSEC) 20 MG capsule   Oral   Take 20 mg by mouth daily.           Marland Kitchen oxyCODONE-acetaminophen (ROXICET) 5-325 MG per tablet   Oral   Take 1 tablet by mouth every 4 (four) hours as needed for pain.   30 tablet   0   . simvastatin (ZOCOR) 80 MG tablet   Oral   Take 40 mg by mouth at bedtime.           BP 133/61  Pulse 63  Temp(Src) 98.2 F (36.8 C) (Oral)  Resp 17  SpO2 97%  Physical Exam  Constitutional: He is oriented to person, place, and time. He appears well-developed. No distress.  HENT:  Head: Normocephalic.  Eyes: Pupils are equal, round, and reactive to light. Right eye exhibits no discharge. Left eye exhibits no discharge.  Neck: Neck supple. No tracheal deviation present.  Cardiovascular: Regular rhythm and intact distal pulses.   +2DP bilaterally  Pulmonary/Chest: Effort normal. No stridor. No respiratory distress. He has no wheezes.  Abdominal: Soft. He exhibits no distension. There is no tenderness. There is no rebound.  Musculoskeletal: Normal range of motion. He exhibits no tenderness.  Full ROM is present in left hip, when ranging pt's left hip he has pain. With straight leg testing -- negative, pain only moves to pt's left knee. He has no C/T/L spine ttp.   Neurological: He is alert and oriented to person, place, and time. No cranial nerve deficit. Coordination normal.  Skin: Skin is warm. No rash noted. He is not diaphoretic. No erythema.    ED Course  Procedures (including critical care time)  Labs Reviewed  POCT I-STAT, CHEM 8 - Abnormal; Notable  for the following:    Creatinine, Ser 1.40 (*)    Hemoglobin 12.9 (*)    HCT 38.0 (*)    All other components within normal limits  SURGICAL PCR SCREEN  URINE CULTURE  URINALYSIS, ROUTINE W REFLEX MICROSCOPIC  PROTIME-INR  GLUCOSE, CAPILLARY  CBC WITH DIFFERENTIAL  VITAMIN D 1,25 DIHYDROXY  BASIC METABOLIC  PANEL  TYPE AND SCREEN  ABO/RH   Dg Hip Complete Left  01/19/2013   *RADIOLOGY REPORT*  Clinical Data: Hip pain.  No known trauma.  Unable to bear weight on left lower extremity.  LEFT HIP - COMPLETE 2+ VIEW  Comparison: CT of the abdomen and pelvis 01/13/2013  Findings: No evidence for acute fracture or subluxation.  Patient has had aorto iliac stent placement.  Regional bowel gas pattern is nonobstructive.  IMPRESSION: Negative exam.   Original Report Authenticated By: Norva Pavlov, M.D.   Dg Chest Port 1 View  01/19/2013   *RADIOLOGY REPORT*  Clinical Data: Check for infiltrates.  Preop.  PORTABLE CHEST - 1 VIEW  Comparison: 12/11/2012  Findings: The patient has had median sternotomy CABG.  Heart is normal in size.  No focal consolidations or pleural effusions.  No pulmonary edema. Degenerative changes are seen in the spine.  IMPRESSION: No evidence for acute cardiopulmonary abnormality.   Original Report Authenticated By: Norva Pavlov, M.D.   Dg Knee Complete 4 Views Left  01/19/2013   *RADIOLOGY REPORT*  Clinical Data: Left hip pain.  Pain in the hip and knee.  No known trauma.  LEFT KNEE - COMPLETE 4+ VIEW  Comparison: None.  Findings: No acute fracture or subluxation. No joint effusion. There is atherosclerotic calcification of the popliteal artery. No radiopaque foreign body or soft tissue gas.  IMPRESSION: No evidence for acute  abnormality.   Original Report Authenticated By: Norva Pavlov, M.D.   Ct Cta Abd/pel W/cm &/or W/o Cm  01/19/2013   *RADIOLOGY REPORT*  Clinical Data:  Evaluate recent abdominal aortic aneurysm repair. Hip pain.  CT ANGIOGRAPHY ABDOMEN AND PELVIS   Technique:  Multidetector CT imaging of the abdomen and pelvis was performed using the standard protocol during bolus administration of intravenous contrast.  Multiplanar reconstructed images including MIPs were obtained and reviewed to evaluate the vascular anatomy.  Contrast: OMNIPAQUE IOHEXOL 350 MG/ML SOLN  Comparison:  CT 01/13/2013  Findings:  An infrarenal aortobiiliac endoluminal stent graft is again noted.  Graft is widely patent without evidence of Endo leak. The excluded aneurysm sac measures 51 by 49 mm not changed from 53 x 46 mm on prior.  The celiac trunk, SMA, and renal arteries are patent.  The IMA is sacrificed and back-fills.  The iliac arteries are normal.  Lung bases are clear.  No pericardial fluid.  No focal hepatic lesion.  Post cholecystectomy.  The pancreas, spleen, adrenal glands, kidneys are normal.  Kidneys enhance symmetrically.  The stomach contains a small hiatal hernia.  The small bowel is normal.  The appendix is normal.  Diverticula descending colon. Diverticula of the sigmoid colon without acute inflammation.  No free fluid the pelvis.  The bladder is distended.  Prostate gland is normal.  Review bone windows demonstrates no acute findings.  No acute findings of the right hip.  No evidence of hematoma.   Review of the MIP images confirms the above findings.  IMPRESSION:  1.  No complication following Endograft repair of abdominal aortic aneurysm. 2.  No acute findings of the right hip.  3.  Colonic diverticulosis without diverticulitis.   Original Report Authenticated By: Genevive Bi, M.D.     MDM  Pt w/ left hip pain that is worsening over several months. At this time, do not suspect coming from pt's graft repair as he has +2 DP, he has no abdominal pain or back pain. Will start w/ xrays and then re-evaluate.  xrays are negative, but will get MR to further evaluate as pt is unable to bear weight on left leg. MRI shows fracture of left hip, ortho is consulted  and pt is admitted under hospitalist team w/ ortho team managing fracture.    1. Closed left hip fracture, initial encounter   2. Abdominal aneurysm without mention of rupture   3. CAD (coronary artery disease)   4. Unspecified essential hypertension   5. Hip fracture, left, closed, initial encounter           Bernadene Person, MD 01/20/13 1478

## 2013-01-19 NOTE — ED Notes (Addendum)
Pt reports left hip pain that radiates down left leg ongoing for several days. Pt reports had aneurysm repair done in April by Dr. Hart Rochester. Pt denies CP, shortness of breath or swelling. Pt has tried tylenol and flexall.

## 2013-01-19 NOTE — H&P (Signed)
[facility] History and Physical  Paul Sandoval. ZOX:[id number] DOB: 12-15-1940 DOA: 01/19/2013  Referring physician: ER physician. PCP: Harlow Asa, MD   Chief Complaint: Left hip pain.  HPI: Paul Sandoval. is a 72 y.o. male who has had a recent abdominal aortic aneurysm repair and was discharged last week was doing fine until today morning when he started developing increasing pain on moving his left hip. Patient states that he's been having chronic left hip pain but today it worsened severely with minimal movement. Denies any trauma or fall. In the ER MRI of the left hip was done and showed left femoral head fracture at this time is admitted for further management. Orthopedic surgeon on call has been consulted. Patient denies any abdominal pain chest pain nausea vomiting diarrhea shortness of breath.  Review of Systems: As presented in the history of presenting illness, rest negative.  Past Medical History  Diagnosis Date  . 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 AAA (abdominal aortic aneurysm) 2010    4.4 cm 08/2008;4.44 in 7/10 and 4.65 in 08/2009; 4.8 by CT in 11/2009; 4.3 by ultrasound in 08/2010  . Hypertension   . Hyperlipidemia   . Tobacco abuse, in remission     40 pack year total consumption; discontinued in 1996  . GERD (gastroesophageal reflux disease)   . Right bundle branch block   . Diverticulosis   . Colonic polyp 2002    polypectomy in 2002  . Arthritis   . COPD (chronic obstructive pulmonary disease)   . Pneumonia     few yrs. ago   Past Surgical History  Procedure Laterality Date  . Colonoscopy  2002    polypectomy-patient denies  . Laparoscopic cholecystectomy  12/2009  . 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 Abdominal aortic endovascular stent graft N/A 12/11/2012    Procedure: ABDOMINAL AORTIC ENDOVASCULAR STENT GRAFT;  Surgeon: Pryor Ochoa, MD;  Location: [facility] OR;  Service: Vascular;  Laterality: N/A;   Ultrasound guided; Gore  . Abdominal aortic aneurysm repair     Social History:  reports that he quit smoking about 18 years ago. His smoking use included Cigarettes. He has a 45 pack-year smoking history. He has never used smokeless tobacco. He reports that he does not drink alcohol or use illicit drugs.  lives at home. where does patient live-- can do ADLs. Can patient participate in ADLs?  No Known Allergies  Family History  Problem Relation Age of Onset  . Colon cancer Neg Hx   . Colon polyps Neg Hx   . Heart disease Father   . Arthritis Mother   . Parkinsonism Mother   . Arthritis Sister     Brother with rheumatoid arthritis  . Cancer Father     Lung  . Hypertension Brother       Prior to Admission medications   Medication Sig Start Date End Date Taking? Authorizing Provider  acetaminophen (TYLENOL) 500 MG tablet Take 1,000 mg by mouth every 6 (six) hours as needed for pain.   Yes Historical Provider, MD  albuterol (PROVENTIL HFA;VENTOLIN HFA) 108 (90 BASE) MCG/ACT inhaler Inhale 2 puffs into the lungs every 6 (six) hours as needed for wheezing.   Yes Historical Provider, MD  aspirin EC 81 MG tablet Take 81 mg by mouth at bedtime.   Yes Historical Provider, MD  enalapril (VASOTEC) 10 MG tablet Take 15 mg by mouth 2 (two) times daily.  Yes Historical Provider, MD  hydrochlorothiazide 25 MG tablet Take 12.5 mg by mouth daily.    Yes Historical Provider, MD  metoprolol (LOPRESSOR) 50 MG tablet Take 50 mg by mouth 2 (two) times daily.    Yes Historical Provider, MD  omeprazole (PRILOSEC) 20 MG capsule Take 20 mg by mouth daily.     Yes Historical Provider, MD  simvastatin (ZOCOR) 80 MG tablet Take 40 mg by mouth at bedtime.   Yes Historical Provider, MD   Physical Exam: Filed Vitals:   01/19/13 1253 01/19/13 1431 01/19/13 2020  BP: 157/71 133/61 162/75  Pulse: 66 63 86  Temp: 97.9 F (36.6 C) 98.2 F (36.8 C) 98.8 F (37.1 C)  TempSrc: Oral Oral Oral  Resp: 18 17 18    Height:   5\' 10"  (1.778 m)  Weight:   87.4 kg (192 lb 10.9 oz)  SpO2: 97% 97% 93%     General:  Well-developed well-nourished.  Eyes:  Anicteric no pallor.  ENT:  No discharge from the ears eyes nose mouth.  Neck:  No mass felt.  Cardiovascular:  S1-S2 heard.  Respiratory:  No rhonchi or crepitations.  Abdomen:  Soft nontender bowel sounds present.  Skin:  No rash.  Musculoskeletal:  Pain on moving left hip.  Psychiatric:  Appears normal.  Neurologic:  Alert and oriented to time place and person. Moves all extremities.  Labs on Admission:  Basic Metabolic Panel:  Recent Labs Lab 01/16/13 0720 01/19/13 1708  NA 141 139  K 5.1 4.2  CL 101 102  CO2 32  --   GLUCOSE 97 95  BUN 15 17  CREATININE 1.69* 1.40*  CALCIUM 9.3  --    Liver Function Tests:  Recent Labs Lab 01/16/13 0720  AST 16  ALT 11  ALKPHOS 78  BILITOT 0.8  PROT 7.0  ALBUMIN 4.0   No results found for this basename: LIPASE, AMYLASE,  in the last 168 hours No results found for this basename: AMMONIA,  in the last 168 hours CBC:  Recent Labs Lab 01/19/13 1708  HGB 12.9*  HCT 38.0*   Cardiac Enzymes: No results found for this basename: CKTOTAL, CKMB, CKMBINDEX, TROPONINI,  in the last 168 hours  BNP (last 3 results) No results found for this basename: PROBNP,  in the last 8760 hours CBG: No results found for this basename: GLUCAP,  in the last 168 hours  Radiological Exams on Admission: Dg Hip Complete Left  01/19/2013   *RADIOLOGY REPORT*  Clinical Data: Hip pain.  No known trauma.  Unable to bear weight on left lower extremity.  LEFT HIP - COMPLETE 2+ VIEW  Comparison: CT of the abdomen and pelvis 01/13/2013  Findings: No evidence for acute fracture or subluxation.  Patient has had aorto iliac stent placement.  Regional bowel gas pattern is nonobstructive.  IMPRESSION: Negative exam.   Original Report Authenticated By: Norva Pavlov, M.D.   Dg Chest Port 1 View  01/19/2013    *RADIOLOGY REPORT*  Clinical Data: Check for infiltrates.  Preop.  PORTABLE CHEST - 1 VIEW  Comparison: 12/11/2012  Findings: The patient has had median sternotomy CABG.  Heart is normal in size.  No focal consolidations or pleural effusions.  No pulmonary edema. Degenerative changes are seen in the spine.  IMPRESSION: No evidence for acute cardiopulmonary abnormality.   Original Report Authenticated By: Norva Pavlov, M.D.   Dg Knee Complete 4 Views Left  01/19/2013   *RADIOLOGY REPORT*  Clinical Data: Left hip pain.  Pain in the hip and knee.  No known trauma.  LEFT KNEE - COMPLETE 4+ VIEW  Comparison: None.  Findings: No acute fracture or subluxation. No joint effusion. There is atherosclerotic calcification of the popliteal artery. No radiopaque foreign body or soft tissue gas.  IMPRESSION: No evidence for acute  abnormality.   Original Report Authenticated By: Norva Pavlov, M.D.   Ct Cta Abd/pel W/cm &/or W/o Cm  01/19/2013   *RADIOLOGY REPORT*  Clinical Data:  Evaluate recent abdominal aortic aneurysm repair. Hip pain.  CT ANGIOGRAPHY ABDOMEN AND PELVIS  Technique:  Multidetector CT imaging of the abdomen and pelvis was performed using the standard protocol during bolus administration of intravenous contrast.  Multiplanar reconstructed images including MIPs were obtained and reviewed to evaluate the vascular anatomy.  Contrast: OMNIPAQUE IOHEXOL 350 MG/ML SOLN  Comparison:  CT 01/13/2013  Findings:  An infrarenal aortobiiliac endoluminal stent graft is again noted.  Graft is widely patent without evidence of Endo leak. The excluded aneurysm sac measures 51 by 49 mm not changed from 53 x 46 mm on prior.  The celiac trunk, SMA, and renal arteries are patent.  The IMA is sacrificed and back-fills.  The iliac arteries are normal.  Lung bases are clear.  No pericardial fluid.  No focal hepatic lesion.  Post cholecystectomy.  The pancreas, spleen, adrenal glands, kidneys are normal.  Kidneys enhance  symmetrically.  The stomach contains a small hiatal hernia.  The small bowel is normal.  The appendix is normal.  Diverticula descending colon. Diverticula of the sigmoid colon without acute inflammation.  No free fluid the pelvis.  The bladder is distended.  Prostate gland is normal.  Review bone windows demonstrates no acute findings.  No acute findings of the right hip.  No evidence of hematoma.   Review of the MIP images confirms the above findings.  IMPRESSION:  1.  No complication following Endograft repair of abdominal aortic aneurysm. 2.  No acute findings of the right hip.  3.  Colonic diverticulosis without diverticulitis.   Original Report Authenticated By: Genevive Bi, M.D.    EKG: Independently reviewed.  Normal sinus rhythm with RBBB.  Assessment/Plan Principal Problem:   Hip fracture, left Active Problems:   HYPERLIPIDEMIA   HYPERTENSION   Cerebrovascular disease   CAD (coronary artery disease)   1. Left hip fracture - orthopedic surgeon on call Dr. Victorino Dike has been consulted. This is a spontaneous fracture of the left hip. Further recommendations per orthopedic surgery. Patient will be kept n.p.o. in anticipation of possible surgery. Continue pain medications. 2. Recent abdominal lactic aneurysm repair - CT abdomen and pelvis was done today which was unremarkable. Patient denies any abdominal pain. 3. CAD status post CABG - denies any chest pain or shortness of breath. 4. Hypertension - continue home medications. 5. Hyperlipidemia - continue home medications. 6. Possible chronic kidney disease - closely follow intake output and daily weight and metabolic panel.    Code Status:  Full code.  Family Communication:  Patient's family at the bedside.  Disposition Plan:  Admit to inpatient.    Alyson Ki N. [facility] Pager [phone].  If 7PM-7AM, please contact night-coverage [email or web address] Password [email or web address] 01/19/2013, 8:31 PM

## 2013-01-19 NOTE — ED Notes (Signed)
Pt back to x-ray at this time.

## 2013-01-19 NOTE — ED Notes (Signed)
Pt remains in x-ray, family waiting in pt's room

## 2013-01-19 NOTE — ED Notes (Signed)
Family at bedside. 

## 2013-01-19 NOTE — Progress Notes (Signed)
Paul Sandoval [id number] Admitted to [id number]: 01/19/2013 8:47 PM Attending Provider: Eduard Clos, MD    Paul Sandoval. is a 72 y.o. male patient admitted from ED awake, alert  & orientated  X 3,  Full Code, VSS - Blood pressure 162/75, pulse 86, temperature 98.8 F (37.1 C), temperature source Oral, resp. rate 18, height 5\' 10"  (1.778 m), weight 87.4 kg (192 lb 10.9 oz), SpO2 93.00%.RA, no c/o shortness of breath, no c/o chest pain, no distress noted.    IV site WDL:  with a transparent dsg that's clean dry and intact.  Allergies:  No Known Allergies   Past Medical History  Diagnosis Date  . 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 AAA (abdominal aortic aneurysm) 2010    4.4 cm 08/2008;4.44 in 7/10 and 4.65 in 08/2009; 4.8 by CT in 11/2009; 4.3 by ultrasound in 08/2010  . Hypertension   . Hyperlipidemia   . Tobacco abuse, in remission     40 pack year total consumption; discontinued in 1996  . GERD (gastroesophageal reflux disease)   . Right bundle branch block   . Diverticulosis   . Colonic polyp 2002    polypectomy in 2002  . Arthritis   . COPD (chronic obstructive pulmonary disease)   . Pneumonia     few yrs. ago    History:  obtained from patient  Pt orientation to unit, room and routine. Information packet given to patient and safety video refused.  Admission INP armband ID verified with patient, and in place. SR up x 2, fall risk assessment complete with Patient verbalizing understanding of risks associated with falls. Pt verbalizes an understanding of how to use the call bell and to call for help before getting out of bed.  Skin, clean-dry- intact without evidence of bruising, or skin tears.   No evidence of skin break down noted on exam.  Will cont to monitor and assist as needed.  [name], RN 01/19/2013 8:47 PM

## 2013-01-19 NOTE — ED Notes (Signed)
Patient transported to MRI 

## 2013-01-20 ENCOUNTER — Ambulatory Visit: Payer: Medicare Other | Admitting: Vascular Surgery

## 2013-01-20 ENCOUNTER — Encounter: Payer: Self-pay | Admitting: *Deleted

## 2013-01-20 ENCOUNTER — Other Ambulatory Visit: Payer: Medicare Other

## 2013-01-20 ENCOUNTER — Encounter (HOSPITAL_COMMUNITY): Payer: Self-pay | Admitting: *Deleted

## 2013-01-20 DIAGNOSIS — E785 Hyperlipidemia, unspecified: Secondary | ICD-10-CM

## 2013-01-20 LAB — CBC WITH DIFFERENTIAL/PLATELET
Eosinophils Absolute: 0.1 10*3/uL (ref 0.0–0.7)
Eosinophils Relative: 1 % (ref 0–5)
HCT: 35.1 % — ABNORMAL LOW (ref 39.0–52.0)
Hemoglobin: 11.4 g/dL — ABNORMAL LOW (ref 13.0–17.0)
Lymphocytes Relative: 24 % (ref 12–46)
Lymphs Abs: 1.9 10*3/uL (ref 0.7–4.0)
MCH: 29.1 pg (ref 26.0–34.0)
MCV: 89.5 fL (ref 78.0–100.0)
Monocytes Absolute: 0.8 10*3/uL (ref 0.1–1.0)
Monocytes Relative: 9 % (ref 3–12)
RBC: 3.92 MIL/uL — ABNORMAL LOW (ref 4.22–5.81)
WBC: 8 10*3/uL (ref 4.0–10.5)

## 2013-01-20 LAB — BASIC METABOLIC PANEL
BUN: 19 mg/dL (ref 6–23)
CO2: 29 mEq/L (ref 19–32)
Calcium: 9 mg/dL (ref 8.4–10.5)
Glucose, Bld: 94 mg/dL (ref 70–99)
Sodium: 135 mEq/L (ref 135–145)

## 2013-01-20 LAB — URINE CULTURE
Colony Count: NO GROWTH
Culture: NO GROWTH

## 2013-01-20 MED ORDER — ENOXAPARIN SODIUM 40 MG/0.4ML ~~LOC~~ SOLN
40.0000 mg | SUBCUTANEOUS | Status: DC
  Administered 2013-01-20: 40 mg via SUBCUTANEOUS
  Filled 2013-01-20 (×2): qty 0.4

## 2013-01-20 MED ORDER — CEFAZOLIN SODIUM-DEXTROSE 2-3 GM-% IV SOLR
2.0000 g | Freq: Once | INTRAVENOUS | Status: AC
  Administered 2013-01-21: 2 g via INTRAVENOUS
  Filled 2013-01-20: qty 50

## 2013-01-20 NOTE — Progress Notes (Signed)
INITIAL NUTRITION ASSESSMENT  DOCUMENTATION CODES Per approved criteria  -Not Applicable   INTERVENTION: 1. No nutrition interventions warranted at this time  NUTRITION DIAGNOSIS: No nutrition dx at this time   Goal: PO intake to meet >/=90% estimated nutrition needs  Monitor:  PO intake, weight trends   Reason for Assessment: Consult  72 y.o. 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 Planned for elective total hip arthroplasty. Pt s/p AAA repair last week.  RD consulted for assessment of nutrition status.   Pt states that he has been eating well PTA. No recent weight loss, no poor appetite.   Height: Ht Readings from Last 1 Encounters:  01/19/13 5\' 10"  (1.778 m)    Weight: Wt Readings from Last 1 Encounters:  01/19/13 192 lb 10.9 oz (87.4 kg)    Ideal Body Weight: 172 lbs   % Ideal Body Weight: 111%  Wt Readings from Last 10 Encounters:  01/19/13 192 lb 10.9 oz (87.4 kg)  01/13/13 194 lb (87.998 kg)  12/11/12 196 lb 13.9 oz (89.3 kg)  12/11/12 196 lb 13.9 oz (89.3 kg)  12/04/12 195 lb (88.451 kg)  12/03/12 194 lb 10.7 oz (88.3 kg)  11/04/12 192 lb (87.091 kg)  05/06/12 193 lb 3.2 oz (87.635 kg)  12/17/11 196 lb (88.905 kg)  05/01/11 194 lb 11.2 oz (88.315 kg)    Usual Body Weight: 195 lbs   % Usual Body Weight: 98%  BMI:  Body mass index is 27.65 kg/(m^2). Overweight   Estimated Nutritional Needs: Kcal: 2000-2200 Protein: 90-100 gm  Fluid: 2-2.2 L   Skin: intact   Diet Order: General  EDUCATION NEEDS: -No education needs identified at this time   Intake/Output Summary (Last 24 hours) at 01/20/13 1045 Last data filed at 01/20/13 0400  Gross per 24 hour  Intake    240 ml  Output    350 ml  Net   -110 ml    Last BM: PTA    Labs:   Recent Labs Lab 01/16/13 0720 01/19/13 1708 01/20/13 0430  NA 141 139 135  K 5.1 4.2 4.4  CL 101 102 99  CO2  32  --  29  BUN 15 17 19   CREATININE 1.69* 1.40* 1.63*  CALCIUM 9.3  --  9.0  GLUCOSE 97 95 94    CBG (last 3)   Recent Labs  01/19/13 2047  GLUCAP 97    Scheduled Meds: . aspirin EC  81 mg Oral QHS  . atorvastatin  40 mg Oral q1800  . enalapril  15 mg Oral BID  . metoprolol  50 mg Oral BID  . pantoprazole  40 mg Oral Daily    Continuous Infusions:   Past Medical History  Diagnosis Date  . 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 AAA (abdominal aortic aneurysm) 2010    4.4 cm 08/2008;4.44 in 7/10 and 4.65 in 08/2009; 4.8 by CT in 11/2009; 4.3 by ultrasound in 08/2010  . Hypertension   . Hyperlipidemia   . Tobacco abuse, in remission     40 pack year total consumption; discontinued in 1996  . GERD (gastroesophageal reflux disease)   . Right bundle branch block   . Diverticulosis   . Colonic polyp 2002    polypectomy in 2002  . Arthritis   . COPD (chronic obstructive pulmonary disease)   . Pneumonia     few  yrs. ago  . CAD (coronary artery disease)   . ED (erectile dysfunction)   . Reflux   . IFG (impaired fasting glucose)   . Chronic rhinitis     Past Surgical History  Procedure Laterality Date  . Colonoscopy  2002    polypectomy-patient denies  . Laparoscopic cholecystectomy  12/2009  . 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 Abdominal aortic endovascular stent graft N/A 12/11/2012    Procedure: ABDOMINAL AORTIC ENDOVASCULAR STENT GRAFT;  Surgeon: Pryor Ochoa, MD;  Location: [facility] OR;  Service: Vascular;  Laterality: N/A;  Ultrasound guided; [place]  . Abdominal aortic aneurysm repair      Clarene Duke RD, LDN Pager [phone] After Hours pager [phone]

## 2013-01-20 NOTE — Progress Notes (Signed)
Patient ID: Paul Sandoval., male   DOB: [birth year], 72 y.o.   MRN: [id number]  Asked to see Paul Sandoval for his progressively worsening left hip pain. History reviewed from records as well as with him  Notes progressive and long standing issues with this hip.  It however has gotten significantly worse now to the point where he can not bear weight without significant pain.  Significant loss of quality of life.  Multiple medical problems including CAD, PAD recent aneurysm repair by Dr. Hart Rochester  With family in his room tonight  Walks with walker currently  Assessment: Left hip acute on chronic AVN supported by recent radiographic studies including MRI  Plan at this point for left total hip replacement to be done as an add on case due to recent onset of significant dysfunction and lack of ability to get around.  This problem has presented and is acting as an acute hip fracture  Patient desires hip replacement versus hemiarthroplasty due to his desire to and necessity to remain as active as possible given medical comorbiditities .  Consent and NPO orders placed Surgery is scheduled as add on tomorrow pm

## 2013-01-20 NOTE — ED Provider Notes (Signed)
I saw and evaluated the patient, reviewed the resident's note and I agree with the findings and plan. If applicable, I agree with the resident's interpretation of the EKG.  If applicable, I was present for critical portions of any procedures performed.  L hip pain for months that is worse today. No trauma.  Recent AAA repair.  TTP L lateral hip, pain with log rolling, no shortening. Intact distal pulses.  No back pain.  Glynn Octave, MD 01/20/13 [phone]

## 2013-01-20 NOTE — Progress Notes (Signed)
Subjective: Left hip pain stable.  Did well with PT today. Pt is awaiting visit from Dr. Charlann Boxer.   Objective: Vital signs in last 24 hours: Temp:  [97.3 F (36.3 C)-98.8 F (37.1 C)] 98.2 F (36.8 C) (05/27 1549) Pulse Rate:  [57-86] 67 (05/27 1549) Resp:  [18] 18 (05/27 1549) BP: (123-162)/(51-75) 125/51 mmHg (05/27 1549) SpO2:  [93 %-97 %] 95 % (05/27 1549) Weight:  [87.4 kg (192 lb 10.9 oz)] 87.4 kg (192 lb 10.9 oz) (05/26 2020)  Intake/Output from previous day: 05/26 0701 - 05/27 0700 In: 240 [P.O.:240] Out: 350 [Urine:350] Intake/Output this shift: Total I/O In: 240 [P.O.:240] Out: 400 [Urine:400]   Recent Labs  01/19/13 1708 01/20/13 0430  HGB 12.9* 11.4*    Recent Labs  01/19/13 1708 01/20/13 0430  WBC  --  8.0  RBC  --  3.92*  HCT 38.0* 35.1*  PLT  --  121*    Recent Labs  01/19/13 1708 01/20/13 0430  NA 139 135  K 4.2 4.4  CL 102 99  CO2  --  29  BUN 17 19  CREATININE 1.40* 1.63*  GLUCOSE 95 94  CALCIUM  --  9.0    Recent Labs  01/19/13 2048  INR 1.02    wn wd male in nad.  A and O.  L hip painful with ROM.  Assessment/Plan: L hip avn.  Await Dr. Nilsa Nutting recommendations.  I'll sign off.   Toni Arthurs 01/20/2013, 4:44 PM

## 2013-01-20 NOTE — Progress Notes (Signed)
PATIENT DETAILS [name]. Age: 72 y.o. Sex: male Date of Birth: 1940/10/06 Admit Date: 01/19/2013 Admitting Physician Eduard Clos, MD ZOX:WRUEAV,W S, MD  Subjective: Left hip pain unchanged  Assessment/Plan: Principal Problem:   Hip fracture, left - Nontraumatic fracture-likely secondary to avascular necrosis - Spoke with Dr. Charlann Boxer, he will evaluate this after none, possible hip replacement on 5/28 - Given multiple medical comorbidities-he will be a high risk candidate, he has excellent functional status and walks approximately 2 miles a day-he is as optimized as he could be-and is stable for surgery. No further workup needed. Patient understands and desires to proceed with surgery accepting all risks. - Continue with metoprolol  Active Problems: History of recent stent graft placement for abdominal aortic aneurysm - No history of abdominal pain - CT of the abdomen on 5/26 shows no complications   CAD status post CABG  - denies any chest pain or shortness of breath. Able to walk 2 miles a day until a week ago. - Continue with aspirin, statin and metoprolol    HYPERLIPIDEMIA - Continue with statin    HYPERTENSION - Controlled with enalapril and metoprolol  Disposition: Remain inpatient  DVT Prophylaxis: Prophylactic Lovenox   Code Status: Full code  Family Communication Wife at bedside  Procedures:  None  CONSULTS:  orthopedic surgery   MEDICATIONS: Scheduled Meds: . aspirin EC  81 mg Oral QHS  . atorvastatin  40 mg Oral q1800  . enalapril  15 mg Oral BID  . metoprolol  50 mg Oral BID  . pantoprazole  40 mg Oral Daily   Continuous Infusions:  PRN Meds:.albuterol, HYDROcodone-acetaminophen, morphine injection  Antibiotics: Anti-infectives   None       PHYSICAL EXAM: Vital signs in last 24 hours: Filed Vitals:   01/19/13 2020 01/19/13 2128 01/20/13 0513 01/20/13 1036  BP: 162/75 144/70 137/71 123/56  Pulse: 86 81 57 57   Temp: 98.8 F (37.1 C) 98.3 F (36.8 C) 97.6 F (36.4 C) 97.3 F (36.3 C)  TempSrc: Oral Oral Oral Oral  Resp: 18 18 18 18   Height: 5\' 10"  (1.778 m)     Weight: 87.4 kg (192 lb 10.9 oz)     SpO2: 93% 95% 95% 97%    Weight change:  Filed Weights   01/19/13 2020  Weight: 87.4 kg (192 lb 10.9 oz)   Body mass index is 27.65 kg/(m^2).   Gen Exam: Awake and alert with clear speech.   Neck: Supple, No JVD.   Chest: B/L Clear.   CVS: S1 S2 Regular, no murmurs. Abdomen: soft, BS +, non tender, non distended.  Extremities: no edema, lower extremities warm to touch. Neurologic: Non Focal.   Skin: No Rash.   Wounds: N/A.    Intake/Output from previous day:  Intake/Output Summary (Last 24 hours) at 01/20/13 1146 Last data filed at 01/20/13 0400  Gross per 24 hour  Intake    240 ml  Output    350 ml  Net   -110 ml     LAB RESULTS: CBC  Recent Labs Lab 01/19/13 1708 01/20/13 0430  WBC  --  8.0  HGB 12.9* 11.4*  HCT 38.0* 35.1*  PLT  --  121*  MCV  --  89.5  MCH  --  29.1  MCHC  --  32.5  RDW  --  14.2  LYMPHSABS  --  1.9  MONOABS  --  0.8  EOSABS  --  0.1  BASOSABS  --  0.0    Chemistries   Recent Labs Lab 01/16/13 0720 01/19/13 1708 01/20/13 0430  NA 141 139 135  K 5.1 4.2 4.4  CL 101 102 99  CO2 32  --  29  GLUCOSE 97 95 94  BUN 15 17 19   CREATININE 1.69* 1.40* 1.63*  CALCIUM 9.3  --  9.0    CBG:  Recent Labs Lab 01/19/13 2047  GLUCAP 97    GFR Estimated Creatinine Clearance: 41.7 ml/min (by C-G formula based on Cr of 1.63).  Coagulation profile  Recent Labs Lab 01/19/13 2048  INR 1.02    Cardiac Enzymes No results found for this basename: CK, CKMB, TROPONINI, MYOGLOBIN,  in the last 168 hours  No components found with this basename: POCBNP,  No results found for this basename: DDIMER,  in the last 72 hours No results found for this basename: HGBA1C,  in the last 72 hours No results found for this basename: CHOL, HDL,  LDLCALC, TRIG, CHOLHDL, LDLDIRECT,  in the last 72 hours No results found for this basename: TSH, T4TOTAL, FREET3, T3FREE, THYROIDAB,  in the last 72 hours No results found for this basename: VITAMINB12, FOLATE, FERRITIN, TIBC, IRON, RETICCTPCT,  in the last 72 hours No results found for this basename: LIPASE, AMYLASE,  in the last 72 hours  Urine Studies No results found for this basename: UACOL, UAPR, USPG, UPH, UTP, UGL, UKET, UBIL, UHGB, UNIT, UROB, ULEU, UEPI, UWBC, URBC, UBAC, CAST, CRYS, UCOM, BILUA,  in the last 72 hours  MICROBIOLOGY: Recent Results (from the past 240 hour(s))  SURGICAL PCR SCREEN     Status: None   Collection Time    01/19/13  8:38 PM      Result Value Range Status   MRSA, PCR NEGATIVE  NEGATIVE Final   Staphylococcus aureus NEGATIVE  NEGATIVE Final   Comment:            The Xpert SA Assay (FDA     approved for NASAL specimens     in patients over 56 years of age),     is one component of     a comprehensive surveillance     program.  Test performance has     been validated by [employer] for patients greater     than or equal to 38 year old.     It is not intended     to diagnose infection nor to     guide or monitor treatment.    RADIOLOGY STUDIES/RESULTS: Dg Hip Complete Left  01/19/2013   *RADIOLOGY REPORT*  Clinical Data: Hip pain.  No known trauma.  Unable to bear weight on left lower extremity.  LEFT HIP - COMPLETE 2+ VIEW  Comparison: CT of the abdomen and pelvis 01/13/2013  Findings: No evidence for acute fracture or subluxation.  Patient has had aorto iliac stent placement.  Regional bowel gas pattern is nonobstructive.  IMPRESSION: Negative exam.   Original Report Authenticated By: Norva Pavlov, M.D.   Mr Hip Left Wo Contrast  01/20/2013   *RADIOLOGY REPORT*  Clinical Data: Chronic left hip pain, severe today.  No known injury.  Question fracture.  MRI OF THE LEFT HIP WITHOUT CONTRAST  Technique:  Multiplanar, multisequence MR imaging  was performed. No intravenous contrast was administered.  Comparison: Radiographs same date.  Pelvic CT 01/13/2013 and 01/19/2013.  Findings: There is heterogeneous T2 hyperintensity throughout the left femoral head and neck.  On the T1-weighted images, there is  serpiginous low T1 marrow signal extending from the femoral head into the femoral neck.  No cortical displacement or subchondral collapse of the femoral head is identified.  Typical findings of femoral head avascular necrosis are not demonstrated.  There is a moderate left hip joint effusion.  The left acetabulum appears normal.  The right femoral head and hip joint appear normal.  There is no evidence of pelvic fracture.  Degenerative changes are present within the lower lumbar spine. The gluteus, hamstring and iliopsoas tendons appear normal bilaterally.  The visualized internal pelvic contents appear unremarkable.  IMPRESSION:  1. Abnormal appearance of the left femoral head and neck with heterogeneous edema, serpiginous low T1 signal and associated hip joint effusion.  In the appropriate clinical context, these findings could reflect a nondisplaced fracture.  However, in the absence of trauma, atypical avascular necrosis should be considered.  The left femoral head and neck do appear somewhat sclerotic on current and prior CTs, and no displaced fracture is demonstrated by CT. 2.  Lower lumbar spondylosis. 3.  No significant muscular or tendon findings.   Original Report Authenticated By: Carey Bullocks, M.D.   Dg Chest Port 1 View  01/19/2013   *RADIOLOGY REPORT*  Clinical Data: Check for infiltrates.  Preop.  PORTABLE CHEST - 1 VIEW  Comparison: 12/11/2012  Findings: The patient has had median sternotomy CABG.  Heart is normal in size.  No focal consolidations or pleural effusions.  No pulmonary edema. Degenerative changes are seen in the spine.  IMPRESSION: No evidence for acute cardiopulmonary abnormality.   Original Report Authenticated By:  Norva Pavlov, M.D.   Dg Knee Complete 4 Views Left  01/19/2013   *RADIOLOGY REPORT*  Clinical Data: Left hip pain.  Pain in the hip and knee.  No known trauma.  LEFT KNEE - COMPLETE 4+ VIEW  Comparison: None.  Findings: No acute fracture or subluxation. No joint effusion. There is atherosclerotic calcification of the popliteal artery. No radiopaque foreign body or soft tissue gas.  IMPRESSION: No evidence for acute  abnormality.   Original Report Authenticated By: Norva Pavlov, M.D.   Ct Cta Abd/pel W/cm &/or W/o Cm  01/19/2013   *RADIOLOGY REPORT*  Clinical Data:  Evaluate recent abdominal aortic aneurysm repair. Hip pain.  CT ANGIOGRAPHY ABDOMEN AND PELVIS  Technique:  Multidetector CT imaging of the abdomen and pelvis was performed using the standard protocol during bolus administration of intravenous contrast.  Multiplanar reconstructed images including MIPs were obtained and reviewed to evaluate the vascular anatomy.  Contrast: OMNIPAQUE IOHEXOL 350 MG/ML SOLN  Comparison:  CT 01/13/2013  Findings:  An infrarenal aortobiiliac endoluminal stent graft is again noted.  Graft is widely patent without evidence of Endo leak. The excluded aneurysm sac measures 51 by 49 mm not changed from 53 x 46 mm on prior.  The celiac trunk, SMA, and renal arteries are patent.  The IMA is sacrificed and back-fills.  The iliac arteries are normal.  Lung bases are clear.  No pericardial fluid.  No focal hepatic lesion.  Post cholecystectomy.  The pancreas, spleen, adrenal glands, kidneys are normal.  Kidneys enhance symmetrically.  The stomach contains a small hiatal hernia.  The small bowel is normal.  The appendix is normal.  Diverticula descending colon. Diverticula of the sigmoid colon without acute inflammation.  No free fluid the pelvis.  The bladder is distended.  Prostate gland is normal.  Review bone windows demonstrates no acute findings.  No acute findings of the right hip.  No evidence of hematoma.    Review of the MIP images confirms the above findings.  IMPRESSION:  1.  No complication following Endograft repair of abdominal aortic aneurysm. 2.  No acute findings of the right hip.  3.  Colonic diverticulosis without diverticulitis.   Original Report Authenticated By: Genevive Bi, M.D.   Ct Angio Abd/pel W/ And/or W/o  01/13/2013   *RADIOLOGY REPORT*  Clinical Data:  Recent stent graft placement.  Left leg and hip discomfort.  CT ANGIOGRAPHY ABDOMEN AND PELVIS  Technique:  Multidetector CT imaging of the abdomen and pelvis was performed using the standard protocol during bolus administration of intravenous contrast.  Multiplanar reconstructed images including MIPs were obtained and reviewed to evaluate the vascular anatomy.  Contrast: OMNIPAQUE IOHEXOL 350 MG/ML SOLN  Comparison:  11/04/2012  Arterial findings: Aorta:                  Minimal plaque in the visualized distal descending thoracic and suprarenal segments.  Interval placement of bifurcated infrarenal stent graft; negative for endoleak.  Excluded native aneurysm sac diameter 4.6 x 5.3 cm, stable.  Celiac axis:            Mild eccentric ostial plaque extending over a length of approximately 12 mm resulting in less than 50% diameter stenosis, unremarkable distal branching.  Superior mesenteric:Patent, unremarkable distal branching anatomy.  Left renal:             Single, patent.  Right renal:            Single, with short segment ostial stenosis resulting in at least 50% diameter narrowing.  Patent distally.  Inferior mesenteric:Origin occlusion, reconstituted distally by visceral collaterals.  Left iliac:             Left limb of the stent graft extends to the mid common iliac, normal in caliber.  Mild plaque in the distal common iliac and external iliac without aneurysm or stenosis.  Right iliac:            Right limb stent graft extends to the mid common iliac.  Mild scattered plaque in the distal common iliac and external iliac  arteries without aneurysm, dissection or stenosis.  Venous findings:  Patent portal vein, splenic vein, bilateral renal veins, hepatic veins.   Review of the MIP images confirms the above findings.  Nonvascular findings: Visualized lung bases clear.  Previous median sternotomy.  Vascular clips in the gallbladder fossa.  Unremarkable liver, spleen, adrenal glands, kidneys, pancreas.  Small hiatal hernia.  Stomach, small bowel, and colon are nondilated.  Normal appendix.  Scattered sigmoid diverticula without adjacent inflammatory/edematous change.  Urinary bladder physiologically distended.  Mild prostatic enlargement.  No ascites.  No free air. No adenopathy.  Mild spondylitic changes L4-5.  IMPRESSION:  1.  Interval placement of infrarenal bifurcated aortic stent graft without apparent complication.   Original Report Authenticated By: D. Andria Rhein, MD    Jeoffrey Massed, MD  [facility] Pager:[phone]  If 7PM-7AM, please contact night-coverage [email or web address] Password [facility] 01/20/2013, 11:46 AM   LOS: 1 day

## 2013-01-20 NOTE — Evaluation (Signed)
Physical Therapy Evaluation Patient Details [name]. MRN: [id number] DOB: [birth year] Today's Date: 01/20/2013 Time: 4098-1191 PT Time Calculation (min): 18 min  PT Assessment / Plan / Recommendation Clinical Impression    Pt admitted with AVN of lt hip. Pt currently with functional limitations due to the deficits listed below (PT Problem List). Pt will benefit from skilled PT to increase their independence and safety with mobility to allow discharge home with wife. If pt has THA during this hospitalization will reassess dc recommendations.      PT Assessment  Patient needs continued PT services    Follow Up Recommendations  No PT follow up    Does the patient have the potential to tolerate intense rehabilitation      Barriers to Discharge        Equipment Recommendations  None recommended by PT    Recommendations for Other Services     Frequency Min 3X/week    Precautions / Restrictions Restrictions Weight Bearing Restrictions: Yes LLE Weight Bearing: Touchdown weight bearing   Pertinent Vitals/Pain Pt reports lt hip feels better.      Mobility  Bed Mobility Bed Mobility: Supine to Sit;Sitting - Scoot to Edge of Bed Supine to Sit: 6: Modified independent (Device/Increase time);HOB elevated Sitting - Scoot to Edge of Bed: 6: Modified independent (Device/Increase time) Transfers Transfers: Sit to Stand;Stand to Sit Sit to Stand: 6: Modified independent (Device/Increase time);With upper extremity assist;From bed;From toilet Stand to Sit: 6: Modified independent (Device/Increase time);With upper extremity assist;With armrests;To chair/3-in-1;To toilet Details for Transfer Assistance: verbal cues for hand placement Ambulation/Gait Ambulation/Gait Assistance: 5: Supervision Ambulation Distance (Feet): 125 Feet Assistive device: Rolling walker Ambulation/Gait Assistance Details: verbal cues to maintain weight bearing Gait Pattern: Step-to pattern     Exercises     PT Diagnosis: Difficulty walking  PT Problem List: Decreased knowledge of precautions;Decreased knowledge of use of DME PT Treatment Interventions: DME instruction;Gait training;Stair training;Functional mobility training   PT Goals Acute Rehab PT Goals PT Goal Formulation: With patient Time For Goal Achievement: 01/22/13 Potential to Achieve Goals: Good Pt will Ambulate: 51 - 150 feet;with modified independence;with rolling walker (maintaining TDWB) Pt will Go Up / Down Stairs: 1-2 stairs;with supervision;with least restrictive assistive device PT Goal: Up/Down Stairs - Progress: Goal set today  Visit Information  Last PT Received On: 01/20/13 Assistance Needed: +1    Subjective Data  Subjective: Pt wants to go ahead and have hip surgery now. Patient Stated Goal: Have hip replacement and go back to playing golf.   Prior Functioning  Home Living Lives With: Spouse Available Help at Discharge: Family Type of Home: House Home Access: Stairs to enter Secretary/administrator of Steps: 1 Home Layout: One level Bathroom Shower/Tub: Engineer, manufacturing systems: Handicapped height Home Adaptive Equipment: Grab bars in shower Additional Comments: Can borrow rolling walker Prior Function Level of Independence: Independent Able to Take Stairs?: Yes Driving: Yes Vocation: Retired Musician: No difficulties    Copywriter, advertising Arousal/Alertness: Awake/alert Behavior During Therapy: WFL for tasks assessed/performed Overall Cognitive Status: Within Functional Limits for tasks assessed    Extremity/Trunk Assessment Right Lower Extremity Assessment RLE ROM/Strength/Tone: [facility] for tasks assessed Left Lower Extremity Assessment LLE ROM/Strength/Tone: Deficits LLE ROM/Strength/Tone Deficits: Grossly 3/5   Balance Balance Balance Assessed: Yes Static Standing Balance Static Standing - Balance Support: Bilateral upper extremity  supported Static Standing - Level of Assistance: 6: Modified independent (Device/Increase time)  End of Session PT - End of Session Equipment  Utilized During Treatment: Gait belt Activity Tolerance: Patient tolerated treatment well Patient left: in chair;with call bell/phone within reach;with family/visitor present Nurse Communication: Mobility status  GP     [facility] 01/20/2013, 9:36 AM  [facility] PT [phone]

## 2013-01-21 ENCOUNTER — Inpatient Hospital Stay (HOSPITAL_COMMUNITY): Payer: Medicare Other | Admitting: Anesthesiology

## 2013-01-21 ENCOUNTER — Ambulatory Visit: Payer: Self-pay | Admitting: Family Medicine

## 2013-01-21 ENCOUNTER — Encounter (HOSPITAL_COMMUNITY): Payer: Self-pay | Admitting: Certified Registered Nurse Anesthetist

## 2013-01-21 ENCOUNTER — Encounter (HOSPITAL_COMMUNITY): Payer: Self-pay | Admitting: Anesthesiology

## 2013-01-21 ENCOUNTER — Encounter (HOSPITAL_COMMUNITY)
Admission: EM | Disposition: A | Discharge status: Home / Self Care | Payer: Self-pay | Source: Home / Self Care | Attending: Internal Medicine

## 2013-01-21 DIAGNOSIS — I679 Cerebrovascular disease, unspecified: Secondary | ICD-10-CM

## 2013-01-21 HISTORY — PX: TOTAL HIP ARTHROPLASTY: SHX124

## 2013-01-21 SURGERY — ARTHROPLASTY, HIP, TOTAL, ANTERIOR APPROACH
Anesthesia: General | Site: Hip | Laterality: Left | Wound class: Clean

## 2013-01-21 MED ORDER — ASPIRIN EC 325 MG PO TBEC: 325.0000 mg | DELAYED_RELEASE_TABLET | Freq: Two times a day (BID) | ORAL | Status: DC

## 2013-01-21 MED ORDER — DEXTROSE 5 % IV SOLN
INTRAVENOUS | Status: DC | PRN
  Administered 2013-01-21: 21:00:00 via INTRAVENOUS

## 2013-01-21 MED ORDER — LACTATED RINGERS IV SOLN
INTRAVENOUS | Status: DC | PRN
  Administered 2013-01-21 (×2): via INTRAVENOUS

## 2013-01-21 MED ORDER — ONDANSETRON HCL 4 MG/2ML IJ SOLN
INTRAMUSCULAR | Status: DC | PRN
  Administered 2013-01-21: 4 mg via INTRAVENOUS

## 2013-01-21 MED ORDER — ONDANSETRON HCL 4 MG/2ML IJ SOLN: 4.0000 mg | Freq: Once | INTRAMUSCULAR | Status: AC | PRN

## 2013-01-21 MED ORDER — FENTANYL CITRATE 0.05 MG/ML IJ SOLN
INTRAMUSCULAR | Status: DC | PRN
  Administered 2013-01-21: 100 ug via INTRAVENOUS
  Administered 2013-01-21: 50 ug via INTRAVENOUS
  Administered 2013-01-21: 100 ug via INTRAVENOUS
  Administered 2013-01-21: 50 ug via INTRAVENOUS
  Administered 2013-01-21: 100 ug via INTRAVENOUS
  Administered 2013-01-21: 50 ug via INTRAVENOUS
  Administered 2013-01-21: 100 ug via INTRAVENOUS

## 2013-01-21 MED ORDER — FENTANYL CITRATE 0.05 MG/ML IJ SOLN: 25.0000 ug | INTRAMUSCULAR | Status: DC | PRN

## 2013-01-21 MED ORDER — NEOSTIGMINE METHYLSULFATE 1 MG/ML IJ SOLN
INTRAMUSCULAR | Status: DC | PRN
  Administered 2013-01-21: 5 mg via INTRAVENOUS

## 2013-01-21 MED ORDER — ROCURONIUM BROMIDE 100 MG/10ML IV SOLN
INTRAVENOUS | Status: DC | PRN
  Administered 2013-01-21: 50 mg via INTRAVENOUS
  Administered 2013-01-21: 10 mg via INTRAVENOUS

## 2013-01-21 MED ORDER — METOCLOPRAMIDE HCL 5 MG/ML IJ SOLN
INTRAMUSCULAR | Status: DC | PRN
  Administered 2013-01-21: 10 mg via INTRAVENOUS

## 2013-01-21 MED ORDER — GLYCOPYRROLATE 0.2 MG/ML IJ SOLN
INTRAMUSCULAR | Status: DC | PRN
  Administered 2013-01-21: 0.6 mg via INTRAVENOUS

## 2013-01-21 MED ORDER — ALBUTEROL SULFATE (5 MG/ML) 0.5% IN NEBU
INHALATION_SOLUTION | RESPIRATORY_TRACT | Status: AC
  Filled 2013-01-21: qty 0.5

## 2013-01-21 MED ORDER — SODIUM CHLORIDE 0.9 % IR SOLN
Status: DC | PRN
  Administered 2013-01-21: 1

## 2013-01-21 MED ORDER — ARTIFICIAL TEARS OP OINT
TOPICAL_OINTMENT | OPHTHALMIC | Status: DC | PRN
  Administered 2013-01-21: 1 via OPHTHALMIC

## 2013-01-21 MED ORDER — SODIUM CHLORIDE 0.9 % IV SOLN
10.0000 mg | INTRAVENOUS | Status: DC | PRN
  Administered 2013-01-21: 10 ug/min via INTRAVENOUS

## 2013-01-21 MED ORDER — LIDOCAINE HCL (CARDIAC) 20 MG/ML IV SOLN
INTRAVENOUS | Status: DC | PRN
  Administered 2013-01-21 (×2): 50 mg via INTRAVENOUS

## 2013-01-21 MED ORDER — DEXAMETHASONE SODIUM PHOSPHATE 4 MG/ML IJ SOLN
INTRAMUSCULAR | Status: DC | PRN
  Administered 2013-01-21: 8 mg via INTRAVENOUS

## 2013-01-21 MED ORDER — HYDROCODONE-ACETAMINOPHEN 5-325 MG PO TABS: 1.0000 | ORAL_TABLET | ORAL | Status: DC | PRN

## 2013-01-21 MED ORDER — MIDAZOLAM HCL 5 MG/5ML IJ SOLN
INTRAMUSCULAR | Status: DC | PRN
  Administered 2013-01-21: 2 mg via INTRAVENOUS

## 2013-01-21 MED ORDER — PROPOFOL 10 MG/ML IV BOLUS
INTRAVENOUS | Status: DC | PRN
  Administered 2013-01-21: 150 mg via INTRAVENOUS

## 2013-01-21 SURGICAL SUPPLY — 53 items
BLADE SAW SGTL 18X1.27X75 (BLADE) ×2 IMPLANT
BLADE SURG ROTATE 9660 (MISCELLANEOUS) IMPLANT
CELLS DAT CNTRL 66122 CELL SVR (MISCELLANEOUS) ×1 IMPLANT
CLOTH BEACON ORANGE TIMEOUT ST (SAFETY) ×2 IMPLANT
COVER BACK TABLE 24X17X13 BIG (DRAPES) IMPLANT
COVER SURGICAL LIGHT HANDLE (MISCELLANEOUS) ×2 IMPLANT
DERMABOND ADVANCED (GAUZE/BANDAGES/DRESSINGS) ×1
DERMABOND ADVANCED .7 DNX12 (GAUZE/BANDAGES/DRESSINGS) ×1 IMPLANT
DRAPE C-ARM 42X72 X-RAY (DRAPES) ×2 IMPLANT
DRAPE STERI IOBAN 125X83 (DRAPES) ×2 IMPLANT
DRAPE U-SHAPE 47X51 STRL (DRAPES) ×6 IMPLANT
DRSG AQUACEL AG ADV 3.5X10 (GAUZE/BANDAGES/DRESSINGS) ×2 IMPLANT
DRSG TEGADERM 4X4.75 (GAUZE/BANDAGES/DRESSINGS) ×2 IMPLANT
DURAPREP 26ML APPLICATOR (WOUND CARE) ×2 IMPLANT
ELECT BLADE 4.0 EZ CLEAN MEGAD (MISCELLANEOUS) ×2
ELECT BLADE TIP CTD 4 INCH (ELECTRODE) IMPLANT
ELECT REM PT RETURN 9FT ADLT (ELECTROSURGICAL) ×2
ELECTRODE BLDE 4.0 EZ CLN MEGD (MISCELLANEOUS) ×1 IMPLANT
ELECTRODE REM PT RTRN 9FT ADLT (ELECTROSURGICAL) ×1 IMPLANT
EVACUATOR 1/8 PVC DRAIN (DRAIN) ×2 IMPLANT
FACESHIELD LNG OPTICON STERILE (SAFETY) ×2 IMPLANT
GAUZE SPONGE 2X2 8PLY STRL LF (GAUZE/BANDAGES/DRESSINGS) ×1 IMPLANT
GLOVE BIOGEL PI IND STRL 7.5 (GLOVE) ×1 IMPLANT
GLOVE BIOGEL PI IND STRL 8 (GLOVE) ×1 IMPLANT
GLOVE BIOGEL PI INDICATOR 7.5 (GLOVE) ×1
GLOVE BIOGEL PI INDICATOR 8 (GLOVE) ×1
GLOVE ECLIPSE 8.0 STRL XLNG CF (GLOVE) ×4 IMPLANT
GLOVE ORTHO TXT STRL SZ7.5 (GLOVE) ×4 IMPLANT
GOWN BRE IMP PREV XXLGXLNG (GOWN DISPOSABLE) ×2 IMPLANT
GOWN STRL NON-REIN LRG LVL3 (GOWN DISPOSABLE) ×4 IMPLANT
GOWN STRL REIN 3XL XLG LVL4 (GOWN DISPOSABLE) ×2 IMPLANT
KIT BASIN OR (CUSTOM PROCEDURE TRAY) ×2 IMPLANT
KIT ROOM TURNOVER OR (KITS) ×2 IMPLANT
MANIFOLD NEPTUNE II (INSTRUMENTS) ×2 IMPLANT
NS IRRIG 1000ML POUR BTL (IV SOLUTION) ×2 IMPLANT
PACK TOTAL JOINT (CUSTOM PROCEDURE TRAY) ×2 IMPLANT
PAD ARMBOARD 7.5X6 YLW CONV (MISCELLANEOUS) ×4 IMPLANT
RESTRAINT LIMB HOLDER UNIV (RESTRAINTS) ×2 IMPLANT
RTRCTR WOUND ALEXIS 18CM MED (MISCELLANEOUS) ×2
SPONGE GAUZE 2X2 STER 10/PKG (GAUZE/BANDAGES/DRESSINGS) ×1
SPONGE LAP 4X18 X RAY DECT (DISPOSABLE) IMPLANT
STAPLER VISISTAT 35W (STAPLE) ×2 IMPLANT
SUCTION FRAZIER TIP 10 FR DISP (SUCTIONS) ×2 IMPLANT
SUT MNCRL AB 4-0 PS2 18 (SUTURE) ×2 IMPLANT
SUT VIC AB 1 CT1 27 (SUTURE) ×8
SUT VIC AB 1 CT1 27XBRD ANBCTR (SUTURE) ×4 IMPLANT
SUT VIC AB 2-0 CT1 27 (SUTURE) ×2
SUT VIC AB 2-0 CT1 TAPERPNT 27 (SUTURE) ×2 IMPLANT
SUT VLOC 180 0 24IN GS25 (SUTURE) ×2 IMPLANT
TOWEL OR 17X24 6PK STRL BLUE (TOWEL DISPOSABLE) ×2 IMPLANT
TOWEL OR 17X26 10 PK STRL BLUE (TOWEL DISPOSABLE) ×2 IMPLANT
TRAY FOLEY CATH 14FR (SET/KITS/TRAYS/PACK) ×2 IMPLANT
WATER STERILE IRR 1000ML POUR (IV SOLUTION) ×6 IMPLANT

## 2013-01-21 NOTE — Op Note (Signed)
NAME:  Paul Sandoval.                ACCOUNT NO.: [id number]      MEDICAL RECORD NO.: [id number]      FACILITY:  [facility]      PHYSICIAN:  Durene Romans D  DATE OF BIRTH:  1941/02/07     DATE OF PROCEDURE:  01/21/2013                                 OPERATIVE REPORT         PREOPERATIVE DIAGNOSIS: Left  hip avascular necrosis      POSTOPERATIVE DIAGNOSIS:  Left hip avascular necrosis.      PROCEDURE:  Left total hip replacement through an anterior approach   utilizing DePuy THR system, component size 52mm pinnacle cup, a size 36+4 neutral   Altrex liner, a size 7 Hi Tri Lock stem with a 36+1.5 metal ball.      SURGEON:  Madlyn Frankel. Charlann Boxer, M.D.      ASSISTANT:  Lanney Gins, PA-C     ANESTHESIA:  General.      SPECIMENS:  None.      COMPLICATIONS:  None.      BLOOD LOSS:  300 cc     DRAINS:  One Hemovac.      INDICATION OF THE PROCEDURE:  Paul Sandoval. is a 72 y.o. male who had   presented to office for evaluation of left hip pain.  Radiographs revealed   progressive degenerative changes with bone-on-bone   articulation to the  hip joint.  The patient had painful limited range of   motion significantly affecting their overall quality of life.  The patient was failing to    respond to conservative measures, and at this point was ready   to proceed with more definitive measures.  The patient has noted progressive   degenerative changes in his hip, progressive problems and dysfunction   with regarding the hip prior to surgery.  Consent was obtained for   benefit of pain relief.  Specific risk of infection, DVT, component   failure, dislocation, need for revision surgery, as well discussion of   the anterior versus posterior approach were reviewed.  Consent was   obtained for benefit of anterior pain relief through an anterior   approach.      PROCEDURE IN DETAIL:  The patient was brought to operative theater.   Once adequate anesthesia,  preoperative antibiotics, 2gm Ancef administered.   The patient was positioned supine on the OSI Hanna table.  Once adequate   padding of boney process was carried out, we had predraped out the hip, and  used fluoroscopy to confirm orientation of the pelvis and position.      The left hip was then prepped and draped from proximal iliac crest to   mid thigh with shower curtain technique.      Time-out was performed identifying the patient, planned procedure, and   extremity.     An incision was then made 2 cm distal and lateral to the   anterior superior iliac spine extending over the orientation of the   tensor fascia lata muscle and sharp dissection was carried down to the   fascia of the muscle and protractor placed in the soft tissues.      The fascia was then incised.  The muscle belly was identified and  swept   laterally and retractor placed along the superior neck.  Following   cauterization of the circumflex vessels and removing some pericapsular   fat, a second cobra retractor was placed on the inferior neck.  A third   retractor was placed on the anterior acetabulum after elevating the   anterior rectus.  A L-capsulotomy was along the line of the   superior neck to the trochanteric fossa, then extended proximally and   distally.  Tag sutures were placed and the retractors were then placed   intracapsular.  We then identified the trochanteric fossa and   orientation of my neck cut, confirmed this radiographically   and then made a neck osteotomy with the femur on traction.  The femoral   head was removed without difficulty or complication.  Traction was let   off and retractors were placed posterior and anterior around the   acetabulum.      The labrum and foveal tissue were debrided.  I began reaming with a 47mm   reamer and reamed up to 51mm reamer with good bony bed preparation and a 52 cup was chosen.  The final 52mm Pinnacle cup was then impacted under fluoroscopy  to  confirm the depth of penetration and orientation with respect to   abduction.  A screw was placed followed by the hole eliminator.  The final   36+4 neutral Altrex liner was impacted with good visualized rim fit.  The cup was positioned anatomically within the acetabular portion of the pelvis.      At this point, the femur was rolled at 80 degrees.  Further capsule was   released off the inferior aspect of the femoral neck.  I then   released the superior capsule proximally.  The hook was placed laterally   along the femur and elevated manually and held in position with the bed   hook.  The leg was then extended and adducted with the leg rolled to 100   degrees of external rotation.  Once the proximal femur was fully   exposed, I used a box osteotome to set orientation.  I then began   broaching with the starting chili pepper broach and passed this by hand and then broached up to 7.  With the 7 broach in place I chose a high offset neck and did a trial reduction with a 36+1.5 ball.  The offset was appropriate, leg lengths   appeared to be equal, confirmed radiographically.   Given these findings, I went ahead and dislocated the hip, repositioned all   retractors and positioned the right hip in the extended and abducted position.  The final 7 Hi Tri Lock stem was   chosen and it was impacted down to the level of neck cut.  Based on this   and the trial reduction, a 36+1.5 metal ball was chosen and   impacted onto a clean and dry trunnion, and the hip was reduced.  The   hip had been irrigated throughout the case again at this point.  I did   reapproximate the superior capsular leaflet to the anterior leaflet   using #1 Vicryl, placed a medium Hemovac drain deep.  The fascia of the   tensor fascia lata muscle was then reapproximated using #1 Vicryl.  The   remaining wound was closed with 2-0 Vicryl and running 4-0 Monocryl.   The hip was cleaned, dried, and dressed sterilely using Dermabond and    Aquacel dressing.  Drain site dressed  separately.  She was then brought   to recovery room in stable condition tolerating the procedure well.    Lanney Gins, PA-C was present for the entirety of the case involved from   preoperative positioning, perioperative retractor management, general   facilitation of the case, as well as primary wound closure as assistant.            Madlyn Frankel Charlann Boxer, M.D.            MDO/MEDQ  D:  06/19/2011  T:  06/19/2011  Job:  [id number]      Electronically Signed by Durene Romans M.D. on 06/25/2011 09:15:38 AM

## 2013-01-21 NOTE — Anesthesia Procedure Notes (Signed)
Procedure Name: Intubation Date/Time: 01/21/2013 9:29 PM Performed by: Wray Kearns A Pre-anesthesia Checklist: Patient identified, Timeout performed, Emergency Drugs available, Suction available and Patient being monitored Patient Re-evaluated:Patient Re-evaluated prior to inductionOxygen Delivery Method: Circle system utilized Preoxygenation: Pre-oxygenation with 100% oxygen Intubation Type: IV induction and Cricoid Pressure applied Ventilation: Mask ventilation without difficulty Laryngoscope Size: Mac and 3 Grade View: Grade I Tube type: Oral Tube size: 7.5 mm Number of attempts: 1 Airway Equipment and Method: Stylet Placement Confirmation: ETT inserted through vocal cords under direct vision,  breath sounds checked- equal and bilateral and positive ETCO2 Secured at: 23 cm Tube secured with: Tape Dental Injury: Teeth and Oropharynx as per pre-operative assessment

## 2013-01-21 NOTE — Transfer of Care (Signed)
Immediate Anesthesia Transfer of Care Note  Patient: Paul Sandoval.  Procedure(s) Performed: Procedure(s): TOTAL HIP ARTHROPLASTY ANTERIOR APPROACH (Left)  Patient Location: PACU  Anesthesia Type:General  Level of Consciousness: oriented, sedated, patient cooperative and responds to stimulation  Airway & Oxygen Therapy: Patient Spontanous Breathing and Patient connected to nasal cannula oxygen  Post-op Assessment: Report given to PACU RN, Post -op Vital signs reviewed and stable, Patient moving all extremities and Patient moving all extremities X 4  Post vital signs: Reviewed and stable  Complications: No apparent anesthesia complications

## 2013-01-21 NOTE — Progress Notes (Signed)
PATIENT DETAILS [name]. Age: 72 y.o. Sex: male Date of Birth: 08-11-1941 Admit Date: 01/19/2013 Admitting Physician Eduard Clos, MD AOZ:HYQMVH,Q S, MD  Subjective: Left hip pain unchanged  Assessment/Plan: Principal Problem:  Hip fracture, left - Nontraumatic fracture-likely secondary to avascular necrosis - Spoke with Dr. Charlann Boxer, he will evaluate this after none, possible hip replacement on 5/28 - Excellent functional status, and history of CAD/CABG with no recent complaints. - Likely has moderate risk because of history of CAD and the surgery type. -  Patient understands and desires to proceed with surgery accepting all risks. - Continue with metoprolol  History of recent stent graft placement for abdominal aortic aneurysm - No history of abdominal pain - CT of the abdomen on 5/26 shows no complications   CAD status post CABG  - denies any chest pain or shortness of breath. Able to walk 2 miles a day until a week ago. - Continue with aspirin, statin and metoprolol    HYPERLIPIDEMIA - Continue with statin    HYPERTENSION - Controlled with enalapril and metoprolol  Disposition: Remain inpatient  DVT Prophylaxis: Prophylactic Lovenox   Code Status: Full code  Family Communication Wife at bedside  Procedures:  None  CONSULTS:  orthopedic surgery   MEDICATIONS: Scheduled Meds: . aspirin EC  81 mg Oral QHS  . atorvastatin  40 mg Oral q1800  .  ceFAZolin (ANCEF) IV  2 g Intravenous Once  . enalapril  15 mg Oral BID  . enoxaparin (LOVENOX) injection  40 mg Subcutaneous Q24H  . metoprolol  50 mg Oral BID  . pantoprazole  40 mg Oral Daily   Continuous Infusions:  PRN Meds:.albuterol, HYDROcodone-acetaminophen, morphine injection  Antibiotics: Anti-infectives   Start     Dose/Rate Route Frequency Ordered Stop   01/21/13 1800  ceFAZolin (ANCEF) IVPB 2 g/50 mL premix     2 g 100 mL/hr over 30 Minutes Intravenous  Once 01/20/13  2150         PHYSICAL EXAM: Vital signs in last 24 hours: Filed Vitals:   01/20/13 2107 01/20/13 2111 01/21/13 0526 01/21/13 1017  BP: 133/61 133/61 111/60 135/59  Pulse: 56 56 63   Temp:  97.4 F (36.3 C) 98.3 F (36.8 C)   TempSrc:  Oral Oral   Resp:  20 18   Height:      Weight:      SpO2:  98% 93%     Weight change:  Filed Weights   01/19/13 2020  Weight: 87.4 kg (192 lb 10.9 oz)   Body mass index is 27.65 kg/(m^2).   Gen Exam: Awake and alert with clear speech.   Neck: Supple, No JVD.   Chest: B/L Clear.   CVS: S1 S2 Regular, no murmurs. Abdomen: soft, BS +, non tender, non distended.  Extremities: no edema, lower extremities warm to touch. Neurologic: Non Focal.   Skin: No Rash.   Wounds: N/A.    Intake/Output from previous day:  Intake/Output Summary (Last 24 hours) at 01/21/13 1053 Last data filed at 01/21/13 0950  Gross per 24 hour  Intake    720 ml  Output    400 ml  Net    320 ml     LAB RESULTS: CBC  Recent Labs Lab 01/19/13 1708 01/20/13 0430  WBC  --  8.0  HGB 12.9* 11.4*  HCT 38.0* 35.1*  PLT  --  121*  MCV  --  89.5  MCH  --  29.1  MCHC  --  32.5  RDW  --  14.2  LYMPHSABS  --  1.9  MONOABS  --  0.8  EOSABS  --  0.1  BASOSABS  --  0.0    Chemistries   Recent Labs Lab 01/16/13 0720 01/19/13 1708 01/20/13 0430  NA 141 139 135  K 5.1 4.2 4.4  CL 101 102 99  CO2 32  --  29  GLUCOSE 97 95 94  BUN 15 17 19   CREATININE 1.69* 1.40* 1.63*  CALCIUM 9.3  --  9.0    CBG:  Recent Labs Lab 01/19/13 2047  GLUCAP 97    GFR Estimated Creatinine Clearance: 41.7 ml/min (by C-G formula based on Cr of 1.63).  Coagulation profile  Recent Labs Lab 01/19/13 2048  INR 1.02    Cardiac Enzymes No results found for this basename: CK, CKMB, TROPONINI, MYOGLOBIN,  in the last 168 hours  No components found with this basename: POCBNP,  No results found for this basename: DDIMER,  in the last 72 hours No results found for  this basename: HGBA1C,  in the last 72 hours No results found for this basename: CHOL, HDL, LDLCALC, TRIG, CHOLHDL, LDLDIRECT,  in the last 72 hours No results found for this basename: TSH, T4TOTAL, FREET3, T3FREE, THYROIDAB,  in the last 72 hours No results found for this basename: VITAMINB12, FOLATE, FERRITIN, TIBC, IRON, RETICCTPCT,  in the last 72 hours No results found for this basename: LIPASE, AMYLASE,  in the last 72 hours  Urine Studies No results found for this basename: UACOL, UAPR, USPG, UPH, UTP, UGL, UKET, UBIL, UHGB, UNIT, UROB, ULEU, UEPI, UWBC, URBC, UBAC, CAST, CRYS, UCOM, BILUA,  in the last 72 hours  MICROBIOLOGY: Recent Results (from the past 240 hour(s))  URINE CULTURE     Status: None   Collection Time    01/19/13  8:37 PM      Result Value Range Status   Specimen Description URINE, RANDOM   Final   Special Requests NONE   Final   Culture  Setup Time 01/19/2013 21:31   Final   Colony Count NO GROWTH   Final   Culture NO GROWTH   Final   Report Status 01/20/2013 FINAL   Final  SURGICAL PCR SCREEN     Status: None   Collection Time    01/19/13  8:38 PM      Result Value Range Status   MRSA, PCR NEGATIVE  NEGATIVE Final   Staphylococcus aureus NEGATIVE  NEGATIVE Final   Comment:            The Xpert SA Assay (FDA     approved for NASAL specimens     in patients over 85 years of age),     is one component of     a comprehensive surveillance     program.  Test performance has     been validated by [employer] for patients greater     than or equal to 40 year old.     It is not intended     to diagnose infection nor to     guide or monitor treatment.  SURGICAL PCR SCREEN     Status: None   Collection Time    01/21/13  6:43 AM      Result Value Range Status   MRSA, PCR NEGATIVE  NEGATIVE Final   Staphylococcus aureus NEGATIVE  NEGATIVE Final   Comment:  The Xpert SA Assay (FDA     approved for NASAL specimens     in patients over 21 years  of age),     is one component of     a comprehensive surveillance     program.  Test performance has     been validated by [employer] for patients greater     than or equal to 73 year old.     It is not intended     to diagnose infection nor to     guide or monitor treatment.    RADIOLOGY STUDIES/RESULTS: Dg Hip Complete Left  01/19/2013   *RADIOLOGY REPORT*  Clinical Data: Hip pain.  No known trauma.  Unable to bear weight on left lower extremity.  LEFT HIP - COMPLETE 2+ VIEW  Comparison: CT of the abdomen and pelvis 01/13/2013  Findings: No evidence for acute fracture or subluxation.  Patient has had aorto iliac stent placement.  Regional bowel gas pattern is nonobstructive.  IMPRESSION: Negative exam.   Original Report Authenticated By: Norva Pavlov, M.D.   Mr Hip Left Wo Contrast  01/20/2013   *RADIOLOGY REPORT*  Clinical Data: Chronic left hip pain, severe today.  No known injury.  Question fracture.  MRI OF THE LEFT HIP WITHOUT CONTRAST  Technique:  Multiplanar, multisequence MR imaging was performed. No intravenous contrast was administered.  Comparison: Radiographs same date.  Pelvic CT 01/13/2013 and 01/19/2013.  Findings: There is heterogeneous T2 hyperintensity throughout the left femoral head and neck.  On the T1-weighted images, there is serpiginous low T1 marrow signal extending from the femoral head into the femoral neck.  No cortical displacement or subchondral collapse of the femoral head is identified.  Typical findings of femoral head avascular necrosis are not demonstrated.  There is a moderate left hip joint effusion.  The left acetabulum appears normal.  The right femoral head and hip joint appear normal.  There is no evidence of pelvic fracture.  Degenerative changes are present within the lower lumbar spine. The gluteus, hamstring and iliopsoas tendons appear normal bilaterally.  The visualized internal pelvic contents appear unremarkable.  IMPRESSION:  1. Abnormal  appearance of the left femoral head and neck with heterogeneous edema, serpiginous low T1 signal and associated hip joint effusion.  In the appropriate clinical context, these findings could reflect a nondisplaced fracture.  However, in the absence of trauma, atypical avascular necrosis should be considered.  The left femoral head and neck do appear somewhat sclerotic on current and prior CTs, and no displaced fracture is demonstrated by CT. 2.  Lower lumbar spondylosis. 3.  No significant muscular or tendon findings.   Original Report Authenticated By: Carey Bullocks, M.D.   Dg Chest Port 1 View  01/19/2013   *RADIOLOGY REPORT*  Clinical Data: Check for infiltrates.  Preop.  PORTABLE CHEST - 1 VIEW  Comparison: 12/11/2012  Findings: The patient has had median sternotomy CABG.  Heart is normal in size.  No focal consolidations or pleural effusions.  No pulmonary edema. Degenerative changes are seen in the spine.  IMPRESSION: No evidence for acute cardiopulmonary abnormality.   Original Report Authenticated By: Norva Pavlov, M.D.   Dg Knee Complete 4 Views Left  01/19/2013   *RADIOLOGY REPORT*  Clinical Data: Left hip pain.  Pain in the hip and knee.  No known trauma.  LEFT KNEE - COMPLETE 4+ VIEW  Comparison: None.  Findings: No acute fracture or subluxation. No joint effusion. There is atherosclerotic  calcification of the popliteal artery. No radiopaque foreign body or soft tissue gas.  IMPRESSION: No evidence for acute  abnormality.   Original Report Authenticated By: Norva Pavlov, M.D.   Ct Cta Abd/pel W/cm &/or W/o Cm  01/19/2013   *RADIOLOGY REPORT*  Clinical Data:  Evaluate recent abdominal aortic aneurysm repair. Hip pain.  CT ANGIOGRAPHY ABDOMEN AND PELVIS  Technique:  Multidetector CT imaging of the abdomen and pelvis was performed using the standard protocol during bolus administration of intravenous contrast.  Multiplanar reconstructed images including MIPs were obtained and reviewed to  evaluate the vascular anatomy.  Contrast: OMNIPAQUE IOHEXOL 350 MG/ML SOLN  Comparison:  CT 01/13/2013  Findings:  An infrarenal aortobiiliac endoluminal stent graft is again noted.  Graft is widely patent without evidence of Endo leak. The excluded aneurysm sac measures 51 by 49 mm not changed from 53 x 46 mm on prior.  The celiac trunk, SMA, and renal arteries are patent.  The IMA is sacrificed and back-fills.  The iliac arteries are normal.  Lung bases are clear.  No pericardial fluid.  No focal hepatic lesion.  Post cholecystectomy.  The pancreas, spleen, adrenal glands, kidneys are normal.  Kidneys enhance symmetrically.  The stomach contains a small hiatal hernia.  The small bowel is normal.  The appendix is normal.  Diverticula descending colon. Diverticula of the sigmoid colon without acute inflammation.  No free fluid the pelvis.  The bladder is distended.  Prostate gland is normal.  Review bone windows demonstrates no acute findings.  No acute findings of the right hip.  No evidence of hematoma.   Review of the MIP images confirms the above findings.  IMPRESSION:  1.  No complication following Endograft repair of abdominal aortic aneurysm. 2.  No acute findings of the right hip.  3.  Colonic diverticulosis without diverticulitis.   Original Report Authenticated By: Genevive Bi, M.D.   Ct Angio Abd/pel W/ And/or W/o  01/13/2013   *RADIOLOGY REPORT*  Clinical Data:  Recent stent graft placement.  Left leg and hip discomfort.  CT ANGIOGRAPHY ABDOMEN AND PELVIS  Technique:  Multidetector CT imaging of the abdomen and pelvis was performed using the standard protocol during bolus administration of intravenous contrast.  Multiplanar reconstructed images including MIPs were obtained and reviewed to evaluate the vascular anatomy.  Contrast: OMNIPAQUE IOHEXOL 350 MG/ML SOLN  Comparison:  11/04/2012  Arterial findings: Aorta:                  Minimal plaque in the visualized distal descending  thoracic and suprarenal segments.  Interval placement of bifurcated infrarenal stent graft; negative for endoleak.  Excluded native aneurysm sac diameter 4.6 x 5.3 cm, stable.  Celiac axis:            Mild eccentric ostial plaque extending over a length of approximately 12 mm resulting in less than 50% diameter stenosis, unremarkable distal branching.  Superior mesenteric:Patent, unremarkable distal branching anatomy.  Left renal:             Single, patent.  Right renal:            Single, with short segment ostial stenosis resulting in at least 50% diameter narrowing.  Patent distally.  Inferior mesenteric:Origin occlusion, reconstituted distally by visceral collaterals.  Left iliac:             Left limb of the stent graft extends to the mid common iliac, normal in caliber.  Mild plaque in the distal  common iliac and external iliac without aneurysm or stenosis.  Right iliac:            Right limb stent graft extends to the mid common iliac.  Mild scattered plaque in the distal common iliac and external iliac arteries without aneurysm, dissection or stenosis.  Venous findings:  Patent portal vein, splenic vein, bilateral renal veins, hepatic veins.   Review of the MIP images confirms the above findings.  Nonvascular findings: Visualized lung bases clear.  Previous median sternotomy.  Vascular clips in the gallbladder fossa.  Unremarkable liver, spleen, adrenal glands, kidneys, pancreas.  Small hiatal hernia.  Stomach, small bowel, and colon are nondilated.  Normal appendix.  Scattered sigmoid diverticula without adjacent inflammatory/edematous change.  Urinary bladder physiologically distended.  Mild prostatic enlargement.  No ascites.  No free air. No adenopathy.  Mild spondylitic changes L4-5.  IMPRESSION:  1.  Interval placement of infrarenal bifurcated aortic stent graft without apparent complication.   Original Report Authenticated By: D. Andria Rhein, MD    Clint Lipps, MD  [facility] Pager:[phone]  If 7PM-7AM, please contact night-coverage [email or web address] Password [facility] 01/21/2013, 10:53 AM   LOS: 2 days

## 2013-01-21 NOTE — Progress Notes (Signed)
Clinical Social Work Department BRIEF PSYCHOSOCIAL ASSESSMENT 01/21/2013  Patient:  Paul Sandoval, Paul Sandoval     Account Number:  [id number]     Admit date:  01/19/2013  Clinical Social Worker:  Margaree Mackintosh  Date/Time:  01/21/2013 02:38 PM  Referred by:  Physician  Date Referred:  01/21/2013 Referred for  SNF Placement   Other Referral:   Interview type:  Patient Other interview type:    PSYCHOSOCIAL DATA Living Status:  FAMILY Admitted from facility:   Level of care:   Primary support name:  Frederik Standley: [phone] Primary support relationship to patient:  SPOUSE Degree of support available:   Adequate.    CURRENT CONCERNS Current Concerns  Post-Acute Placement   Other Concerns:    SOCIAL WORK ASSESSMENT / PLAN Clinical Social Worker received referral indicating potential need for SNF at dc.  CSW reviewed chart and spoke with pt.  CSW introduced self, explained role, and provided support.  CSW inquired about SNF; pt stated he was not interested in SNF and would prefer to complete rehab at home.  Pt shares his wife and his son can assist with his care at dc.  CSW updated RNCM.  CSW to sign off, please re consult if needed.   Assessment/plan status:  Information/Referral to [facility] Other assessment/ plan:   Information/referral to community resources:   SNF  [facility]    PATIENT'S/FAMILY'S RESPONSE TO PLAN OF CARE: Pt thanked CSW for intervention.  Pt stated he was not interested in SNF.

## 2013-01-21 NOTE — Anesthesia Preprocedure Evaluation (Signed)
Anesthesia Evaluation  Patient identified by MRN, date of birth, ID band Patient awake    Reviewed: Allergy & Precautions, H&P , NPO status , Patient's Chart, lab work & pertinent test results, reviewed documented beta blocker date and time   Airway Mallampati: II TM Distance: >3 FB Neck ROM: Full    Dental  (+) Teeth Intact and Dental Advisory Given   Pulmonary COPD breath sounds clear to auscultation        Cardiovascular Exercise Tolerance: Good hypertension, Pt. on medications and Pt. on home beta blockers + CAD Rhythm:Regular Rate:Normal     Neuro/Psych    GI/Hepatic GERD-  Medicated and Controlled,  Endo/Other    Renal/GU      Musculoskeletal   Abdominal   Peds  Hematology   Anesthesia Other Findings   Reproductive/Obstetrics                           Anesthesia Physical Anesthesia Plan  ASA: III  Anesthesia Plan: General   Post-op Pain Management:    Induction: Intravenous  Airway Management Planned: Oral ETT  Additional Equipment:   Intra-op Plan:   Post-operative Plan: Extubation in OR  Informed Consent: I have reviewed the patients History and Physical, chart, labs and discussed the procedure including the risks, benefits and alternatives for the proposed anesthesia with the patient or authorized representative who has indicated his/her understanding and acceptance.   Dental advisory given  Plan Discussed with: CRNA, Anesthesiologist and Surgeon  Anesthesia Plan Comments:         Anesthesia Quick Evaluation

## 2013-01-21 NOTE — Preoperative (Addendum)
Beta Blockers   Reason not to administer Beta blocker: took metoprolol 05/28 1020 per MAR.

## 2013-01-21 NOTE — Care Management Note (Signed)
    Page 1 of 2   01/23/2013     12:12:14 PM   CARE MANAGEMENT NOTE 01/23/2013  Patient:  Paul Sandoval, Paul Sandoval   Account Number:  [id number]  Date Initiated:  01/21/2013  Documentation initiated by:  Letha Cape  Subjective/Objective Assessment:   dx hip fx  admit- lives with spouse. patient for surgery 5/28.     Action/Plan:   5/28 surgery   Anticipated DC Date:  01/24/2013   Anticipated DC Plan:  HOME W HOME HEALTH SERVICES  In-house referral  Clinical Social Worker      DC Planning Services  CM consult      [facility] Choice  HOME HEALTH   Choice offered to / List presented to:  C-1 Patient        HH arranged  HH-1 RN  HH-2 PT      [facility] agency  [facility]   Status of service:  Completed, signed off Medicare Important Message given?   (If response is "NO", the following Medicare IM given date fields will be blank) Date Medicare IM given:   Date Additional Medicare IM given:    Discharge Disposition:  HOME W HOME HEALTH SERVICES  Per UR Regulation:  Reviewed for med. necessity/level of care/duration of stay  If discussed at Long Length of Stay Meetings, dates discussed:    Comments:  01/23/13 12:09 Letha Cape RN, BSN [phone] patient is for dc tomorrow, he and wife chose [facility] for HHPT and HHRN, patient has a hemovac drain in.  Referral made to [facility], Marie notified.  Soc will begin 24-48 hrs post discharge.  Patient has medication coverage and transportation.  01/21/13 16:02 Letha Cape RN, BSN [phone] patient is for hip surgery today, await pt /ot eval.  CSW spoke with patient for potential snf after dc , but patient is not interested in snf.  NCM will continue to follow for dc needs.

## 2013-01-21 NOTE — Progress Notes (Signed)
Patient ID: Paul Sandoval., male   DOB: 04-10-1941, 72 y.o.   MRN: [id number]  Patient ready to proceed with left total hip replacement  Risks and benefits of total hip versus hemiarthroplasty reviewed in addition to standard risks of infection DVT hip dislocation all in favor of pain relief for an improved quality of life  NPO Consent on chart

## 2013-01-21 NOTE — Progress Notes (Signed)
Patient completed CHG bath, voided, left for OR.  Ancef in chart, patient alert oriented x 4, wife at bedside.

## 2013-01-22 ENCOUNTER — Inpatient Hospital Stay (HOSPITAL_COMMUNITY): Payer: Medicare Other

## 2013-01-22 LAB — BASIC METABOLIC PANEL
BUN: 20 mg/dL (ref 6–23)
CO2: 23 mEq/L (ref 19–32)
Calcium: 8.7 mg/dL (ref 8.4–10.5)
Glucose, Bld: 158 mg/dL — ABNORMAL HIGH (ref 70–99)
Potassium: 4.3 mEq/L (ref 3.5–5.1)
Sodium: 137 mEq/L (ref 135–145)

## 2013-01-22 LAB — CBC
HCT: 31.5 % — ABNORMAL LOW (ref 39.0–52.0)
Hemoglobin: 10.5 g/dL — ABNORMAL LOW (ref 13.0–17.0)
MCH: 29 pg (ref 26.0–34.0)
MCHC: 33.3 g/dL (ref 30.0–36.0)
RBC: 3.62 MIL/uL — ABNORMAL LOW (ref 4.22–5.81)

## 2013-01-22 MED ORDER — PHENOL 1.4 % MT LIQD: 1.0000 | OROMUCOSAL | Status: DC | PRN

## 2013-01-22 MED ORDER — FERROUS SULFATE 325 (65 FE) MG PO TABS
325.0000 mg | ORAL_TABLET | Freq: Three times a day (TID) | ORAL | Status: DC
  Administered 2013-01-22 – 2013-01-24 (×7): 325 mg via ORAL
  Filled 2013-01-22 (×10): qty 1

## 2013-01-22 MED ORDER — POTASSIUM CHLORIDE 2 MEQ/ML IV SOLN
INTRAVENOUS | Status: DC
  Administered 2013-01-22: 01:00:00 via INTRAVENOUS
  Filled 2013-01-22 (×7): qty 1000

## 2013-01-22 MED ORDER — METOCLOPRAMIDE HCL 5 MG/ML IJ SOLN: 5.0000 mg | Freq: Three times a day (TID) | INTRAMUSCULAR | Status: DC | PRN

## 2013-01-22 MED ORDER — BISACODYL 10 MG RE SUPP: 10.0000 mg | Freq: Every day | RECTAL | Status: DC | PRN

## 2013-01-22 MED ORDER — POLYETHYLENE GLYCOL 3350 17 G PO PACK: 17.0000 g | PACK | Freq: Every day | ORAL | Status: DC | PRN

## 2013-01-22 MED ORDER — METOCLOPRAMIDE HCL 10 MG PO TABS: 5.0000 mg | ORAL_TABLET | Freq: Three times a day (TID) | ORAL | Status: DC | PRN

## 2013-01-22 MED ORDER — ASPIRIN EC 325 MG PO TBEC
325.0000 mg | DELAYED_RELEASE_TABLET | Freq: Two times a day (BID) | ORAL | Status: DC
  Administered 2013-01-22 – 2013-01-24 (×3): 325 mg via ORAL
  Filled 2013-01-22 (×7): qty 1

## 2013-01-22 MED ORDER — CEFAZOLIN SODIUM-DEXTROSE 2-3 GM-% IV SOLR
2.0000 g | Freq: Four times a day (QID) | INTRAVENOUS | Status: AC
  Administered 2013-01-22 (×2): 2 g via INTRAVENOUS
  Filled 2013-01-22 (×2): qty 50

## 2013-01-22 MED ORDER — METHOCARBAMOL 100 MG/ML IJ SOLN: 500.0000 mg | Freq: Four times a day (QID) | INTRAVENOUS | Status: DC | PRN

## 2013-01-22 MED ORDER — MENTHOL 3 MG MT LOZG: 1.0000 | LOZENGE | OROMUCOSAL | Status: DC | PRN

## 2013-01-22 MED ORDER — METHOCARBAMOL 500 MG PO TABS: 500.0000 mg | ORAL_TABLET | Freq: Four times a day (QID) | ORAL | Status: DC | PRN

## 2013-01-22 MED ORDER — HYDROCODONE-ACETAMINOPHEN 5-325 MG PO TABS
1.0000 | ORAL_TABLET | ORAL | Status: DC | PRN
  Administered 2013-01-22: 1 via ORAL
  Administered 2013-01-22 – 2013-01-23 (×3): 2 via ORAL
  Filled 2013-01-22 (×2): qty 2
  Filled 2013-01-22: qty 1
  Filled 2013-01-22: qty 2

## 2013-01-22 MED ORDER — ONDANSETRON HCL 4 MG PO TABS: 4.0000 mg | ORAL_TABLET | Freq: Four times a day (QID) | ORAL | Status: DC | PRN

## 2013-01-22 MED ORDER — ONDANSETRON HCL 4 MG/2ML IJ SOLN: 4.0000 mg | Freq: Four times a day (QID) | INTRAMUSCULAR | Status: DC | PRN

## 2013-01-22 MED ORDER — DOCUSATE SODIUM 100 MG PO CAPS
100.0000 mg | ORAL_CAPSULE | Freq: Two times a day (BID) | ORAL | Status: DC
  Administered 2013-01-22 – 2013-01-24 (×5): 100 mg via ORAL
  Filled 2013-01-22 (×6): qty 1

## 2013-01-22 MED ORDER — FLEET ENEMA 7-19 GM/118ML RE ENEM: 1.0000 | ENEMA | Freq: Once | RECTAL | Status: AC | PRN

## 2013-01-22 NOTE — Evaluation (Signed)
Occupational Therapy Evaluation and Discharge Patient Details [name]. MRN: [id number] DOB: 04/02/1941 Today's Date: 01/22/2013 Time: 4098-1191 OT Time Calculation (min): 18 min  OT Assessment / Plan / Recommendation Clinical Impression  This 72 yo male s/p anterior left hip replacement presents to acute OT with all education completed. Will D/C from acute OT.    OT Assessment  Patient does not need any further OT services    Follow Up Recommendations  No OT follow up       Equipment Recommendations  None recommended by OT          Precautions / Restrictions Precautions Precautions: None Restrictions Weight Bearing Restrictions: No LLE Weight Bearing: Weight bearing as tolerated       ADL  Equipment Used: Rolling walker Transfers/Ambulation Related to ADLs: Mod I for all ADL Comments: Pt able to cross one leg over the other to get to his socks (even his LLE:))        Visit Information  Last OT Received On: 01/22/13 Assistance Needed: +1    Subjective Data  Subjective: "It burns a little, but does not hurt" (in response to how his hip felt) Patient Stated Goal: "Get out of here"   Prior Functioning     Home Living Lives With: Spouse Available Help at Discharge: Family Type of Home: House Home Access: Stairs to enter Secretary/administrator of Steps: 1 Home Layout: One level Bathroom Shower/Tub: Engineer, manufacturing systems: Handicapped height Home Adaptive Equipment: None Additional Comments: Can borrow rolling walker Prior Function Level of Independence: Independent Able to Take Stairs?: Yes Driving: Yes Vocation: Retired Musician: No difficulties         Vision/Perception Vision - History Patient Visual Report: No change from baseline   Cognition  Cognition Arousal/Alertness: Awake/alert Behavior During Therapy: WFL for tasks assessed/performed Overall Cognitive Status: Within Functional Limits for  tasks assessed    Extremity/Trunk Assessment Right Upper Extremity Assessment RUE ROM/Strength/Tone: Within functional levels Left Upper Extremity Assessment LUE ROM/Strength/Tone: Within functional levels     Mobility Bed Mobility Details for Bed Mobility Assistance: Pt up in recliner upon arrival Transfers Transfers: Sit to Stand;Stand to Sit Sit to Stand: 6: Modified independent (Device/Increase time);With upper extremity assist;With armrests;From chair/3-in-1 Stand to Sit: 6: Modified independent (Device/Increase time);With upper extremity assist;With armrests;To chair/3-in-1 Details for Transfer Assistance: verbal cues for hand placement           End of Session OT - End of Session Activity Tolerance: Patient tolerated treatment well Patient left: in chair;with call bell/phone within reach;with family/visitor present       Evette Georges [phone] 01/22/2013, 1:32 PM

## 2013-01-22 NOTE — Progress Notes (Signed)
Patient returned from OR, oxygen via nasal cannula 2L, dressing to left hip clean dry and intact, hemovac drain to left hip.  Ice pack applied to left hip, continuous pulse ox for 24 hours.  Pain rated 2/10. Foley to gravity.  Will continue to monitor.

## 2013-01-22 NOTE — Progress Notes (Signed)
Physical Therapy Treatment Patient Details [name]. MRN: [id number] DOB: Mar 09, 1941 Today's Date: 01/22/2013 Time: 4098-1191 PT Time Calculation (min): 28 min  PT Assessment / Plan / Recommendation Comments on Treatment Session  pt wanted to ambulate to bathroom, then back to chair, then ambulated to steps and in hallway.  O2 sats 96 % on RA upon return to room    Follow Up Recommendations  Home health PT (pt making quick progress, may not need HHPT?)     Does the patient have the potential to tolerate intense rehabilitation     Barriers to Discharge        Equipment Recommendations  None recommended by PT    Recommendations for Other Services    Frequency 7X/week   Plan Discharge plan remains appropriate    Precautions / Restrictions Precautions Precautions: None Restrictions Weight Bearing Restrictions: No LLE Weight Bearing: Weight bearing as tolerated   Pertinent Vitals/Pain Pt reports only discomfort in left hip.  Like its "stretching"    Mobility  Bed Mobility Bed Mobility: Supine to Sit;Sitting - Scoot to Edge of Bed Supine to Sit: HOB elevated;4: Min assist Sitting - Scoot to [place] of Bed: 4: Min guard Details for Bed Mobility Assistance: Pt up in recliner upon arrival Transfers Transfers: Sit to Stand;Stand to Sit Sit to Stand: 6: Modified independent (Device/Increase time);With upper extremity assist;With armrests;From chair/3-in-1 Stand to Sit: 6: Modified independent (Device/Increase time);With upper extremity assist;With armrests;To chair/3-in-1 Details for Transfer Assistance: pt able to use hands to push up and to move left foot forward to protect it for sit<>stand Ambulation/Gait Ambulation/Gait Assistance: 5: Supervision Ambulation Distance (Feet): 300 Feet Assistive device: Rolling walker Ambulation/Gait Assistance Details: cues to stand erect Gait Pattern: Step-through pattern;Within Functional Limits Gait velocity: wfl General  Gait Details: Pt is able to bear weight through LLE without difficulty Stairs: Yes Stairs Assistance: 5: Supervision Stair Management Technique: Two rails Number of Stairs: 1 Wheelchair Mobility Wheelchair Mobility: No    Exercises General Exercises - Lower Extremity Ankle Circles/Pumps: AROM;Both;5 reps;Supine Short Arc Quad: AROM;Left;15 reps;Supine   PT Diagnosis: Difficulty walking  PT Problem List: Decreased knowledge of precautions;Decreased knowledge of use of DME;Decreased activity tolerance;Decreased mobility PT Treatment Interventions: DME instruction;Gait training;Stair training;Functional mobility training   PT Goals Acute Rehab PT Goals PT Goal Formulation: With patient Time For Goal Achievement: 01/22/13 Potential to Achieve Goals: Good Pt will Ambulate: 51 - 150 feet;with modified independence;with rolling walker (maintaining TDWB) PT Goal: Ambulate - Progress: Progressing toward goal Pt will Go Up / Down Stairs: 1-2 stairs;with supervision;with least restrictive assistive device PT Goal: Up/Down Stairs - Progress: Met  Visit Information  Last PT Received On: 01/22/13 Assistance Needed: +1    Subjective Data  Subjective:  "I need to get to the bathroom" Patient Stated Goal: to get back to his life   Cognition  Cognition Arousal/Alertness: Awake/alert Behavior During Therapy: WFL for tasks assessed/performed Overall Cognitive Status: Within Functional Limits for tasks assessed    Balance  Balance Balance Assessed: Yes Static Sitting Balance Static Sitting - Balance Support: No upper extremity supported;Feet supported Static Sitting - Level of Assistance: 7: Independent Static Standing Balance Static Standing - Balance Support: Bilateral upper extremity supported Static Standing - Level of Assistance: 6: Modified independent (Device/Increase time)  End of Session PT - End of Session Equipment Utilized During Treatment: Gait belt Activity Tolerance:  Patient tolerated treatment well Patient left: in bed;with family/visitor present;Other (comment) (sittin on EOB for wife to  assist with bathing) Nurse Communication: Mobility status   GP    [name]. [place] [phone] 01/22/2013, 2:57 PM

## 2013-01-22 NOTE — Anesthesia Postprocedure Evaluation (Signed)
  Anesthesia Post-op Note  Patient: Paul Sandoval.  Procedure(s) Performed: Procedure(s): TOTAL HIP ARTHROPLASTY ANTERIOR APPROACH (Left)  Patient Location: PACU  Anesthesia Type:General  Level of Consciousness: awake, alert  and oriented  Airway and Oxygen Therapy: Patient Spontanous Breathing and Patient connected to nasal cannula oxygen  Post-op Pain: mild  Post-op Assessment: Post-op Vital signs reviewed  Post-op Vital Signs: Reviewed  Complications: No apparent anesthesia complications

## 2013-01-22 NOTE — Evaluation (Signed)
Physical Therapy Re- Evaluation Patient Details [name]. MRN: [id number] DOB: 09/06/1940 Today's Date: 01/22/2013 Time: 7829-5621 PT Time Calculation (min): 35 min  PT Assessment / Plan / Recommendation Clinical Impression  72 yo who underwent a left direct THR 01/21/13. He is doing well one day post op.  Anticipate he will be able to d/c to home with HHPT    PT Assessment  Patient needs continued PT services    Follow Up Recommendations  Home health PT    Does the patient have the potential to tolerate intense rehabilitation      Barriers to Discharge        Equipment Recommendations  None recommended by PT    Recommendations for Other Services     Frequency Min 3X/week    Precautions / Restrictions Precautions Precautions: None Restrictions Weight Bearing Restrictions: No LLE Weight Bearing: Weight bearing as tolerated   Pertinent Vitals/Pain Pt reports he as no pain at rest...discomfort when he is walking      Mobility  Bed Mobility Bed Mobility: Supine to Sit;Sitting - Scoot to Edge of Bed Supine to Sit: HOB elevated;4: Min assist Sitting - Scoot to [place] of Bed: 4: Min guard Details for Bed Mobility Assistance: verbal cues and min assist for left leg to get to EOB Transfers Transfers: Sit to Stand;Stand to Sit Sit to Stand: 6: Modified independent (Device/Increase time);With upper extremity assist;From bed Stand to Sit: 6: Modified independent (Device/Increase time);With upper extremity assist;With armrests;To chair/3-in-1;To toilet Details for Transfer Assistance: verbal cues for hand placement Ambulation/Gait Ambulation/Gait Assistance: 4: Min assist Ambulation Distance (Feet): 150 Feet (75x2) Assistive device: Rolling walker Ambulation/Gait Assistance Details: min assist for safety Gait Pattern: Step-to pattern General Gait Details: Pt is able to bear weight through LLE without difficulty Stairs: No Wheelchair Mobility Wheelchair  Mobility: No    Exercises General Exercises - Lower Extremity Ankle Circles/Pumps: AROM;Both;5 reps;Supine Short Arc Quad: AROM;Left;15 reps;Supine   PT Diagnosis: Difficulty walking  PT Problem List: Decreased knowledge of precautions;Decreased knowledge of use of DME;Decreased activity tolerance;Decreased mobility PT Treatment Interventions: DME instruction;Gait training;Stair training;Functional mobility training   PT Goals Acute Rehab PT Goals PT Goal Formulation: With patient Time For Goal Achievement: 01/22/13 Potential to Achieve Goals: Good Pt will Ambulate: 51 - 150 feet;with modified independence;with rolling walker (maintaining TDWB) PT Goal: Ambulate - Progress: Goal set today Pt will Go Up / Down Stairs: 1-2 stairs;with supervision;with least restrictive assistive device PT Goal: Up/Down Stairs - Progress: Goal set today  Visit Information  Last PT Received On: 01/22/13    Subjective Data  Subjective: pt anxious to get up  Patient Stated Goal: to get back to his lift   Prior Functioning  Home Living Lives With: Spouse Available Help at Discharge: Family Type of Home: House Home Access: Stairs to enter Secretary/administrator of Steps: 1 Home Layout: One level Bathroom Shower/Tub: Engineer, manufacturing systems: Handicapped height Home Adaptive Equipment: None Additional Comments: Can borrow rolling walker Prior Function Level of Independence: Independent Able to Take Stairs?: Yes Driving: Yes Vocation: Retired Musician: No difficulties    Copywriter, advertising Arousal/Alertness: Awake/alert Behavior During Therapy: WFL for tasks assessed/performed Overall Cognitive Status: Within Functional Limits for tasks assessed    Extremity/Trunk Assessment Right Lower Extremity Assessment RLE ROM/Strength/Tone: [facility] for tasks assessed Left Lower Extremity Assessment LLE ROM/Strength/Tone: Deficits LLE ROM/Strength/Tone Deficits: Grossly 3/5   pt with hemovac and dressing over anterior hip.   LLE Sensation: WFL -  Light Touch;WFL - Proprioception Trunk Assessment Trunk Assessment: Normal   Balance Balance Balance Assessed: Yes Static Sitting Balance Static Sitting - Balance Support: No upper extremity supported;Feet supported Static Sitting - Level of Assistance: 7: Independent Static Standing Balance Static Standing - Balance Support: Bilateral upper extremity supported Static Standing - Level of Assistance: 6: Modified independent (Device/Increase time)  End of Session PT - End of Session Equipment Utilized During Treatment: Gait belt Activity Tolerance: Patient tolerated treatment well Patient left: in chair;with call bell/phone within reach;with family/visitor present Nurse Communication: Mobility status  GP     Donnetta Hail 01/22/2013, 12:40 PM

## 2013-01-22 NOTE — Progress Notes (Signed)
PATIENT DETAILS [name]. Age: 72 y.o. Sex: male Date of Birth: 1940-09-14 Admit Date: 01/19/2013 Admitting Physician Eduard Clos, MD WUJ:[id number],N S, MD  Subjective: Status post left total hip replacement. Pain is controlled.  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 - Likely has moderate risk because of history of CAD and the surgery type. -  Patient understands and desires to proceed with surgery accepting all risks. - Continue with metoprolol. - DVT prophylaxis currently on aspirin 325 mg by mouth twice a day. - Orthopedics please advise about DVT prophylaxis when he goes home (medication for DVT prophylaxis/duration)   History of recent stent graft placement for abdominal aortic aneurysm - No history of abdominal pain - CT of the abdomen on 5/26 shows no complications   CAD status post CABG  - denies any chest pain or shortness of breath. Able to walk 2 miles a day until a week ago. - Continue with aspirin, statin and metoprolol    HYPERLIPIDEMIA - Continue with statin    HYPERTENSION - Controlled with enalapril and metoprolol  Disposition: Remain inpatient  DVT Prophylaxis: Prophylactic Lovenox   Code Status: Full code  Family Communication Wife at bedside  Procedures:  None  CONSULTS:  orthopedic surgery   MEDICATIONS: Scheduled Meds: . aspirin EC  325 mg Oral BID  . atorvastatin  40 mg Oral q1800  . docusate sodium  100 mg Oral BID  . enalapril  15 mg Oral BID  . ferrous sulfate  325 mg Oral TID PC  . metoprolol  50 mg Oral BID  . pantoprazole  40 mg Oral Daily   Continuous Infusions: . sodium chloride 0.9 % 1,000 mL with potassium chloride 10 mEq infusion 75 mL/hr at 01/22/13 0056   PRN Meds:.albuterol, bisacodyl, fentaNYL, HYDROcodone-acetaminophen,  menthol-cetylpyridinium, methocarbamol (ROBAXIN) IV, methocarbamol, metoCLOPramide (REGLAN) injection, metoCLOPramide, morphine injection, ondansetron (ZOFRAN) IV, ondansetron, phenol, polyethylene glycol, sodium phosphate  Antibiotics: Anti-infectives   Start     Dose/Rate Route Frequency Ordered Stop   01/22/13 0300  ceFAZolin (ANCEF) IVPB 2 g/50 mL premix     2 g 100 mL/hr over 30 Minutes Intravenous Every 6 hours 01/22/13 0002 01/22/13 0830   01/21/13 1800  ceFAZolin (ANCEF) IVPB 2 g/50 mL premix     2 g 100 mL/hr over 30 Minutes Intravenous  Once 01/20/13 2150 01/21/13 2120       PHYSICAL EXAM: Vital signs in last 24 hours: Filed Vitals:   01/22/13 0400 01/22/13 0543 01/22/13 0800 01/22/13 0948  BP: 117/60 123/55  124/71  Pulse: 96 91  104  Temp:  97.7 F (36.5 C)    TempSrc:  Oral    Resp: 18 20 20    Height:      Weight:      SpO2: 98% 94% 95%     Weight change:  Filed Weights   01/19/13 2020  Weight: 87.4 kg (192 lb 10.9 oz)   Body mass index is 27.65 kg/(m^2).   Gen Exam: Awake and alert with clear speech.   Neck: Supple, No JVD.   Chest: B/L Clear.   CVS: S1 S2 Regular, no murmurs. Abdomen: soft, BS +, non tender, non distended.  Extremities: no edema, lower extremities warm to touch. Neurologic: Non Focal.   Skin: No Rash.   Wounds: N/A.    Intake/Output from previous day:  Intake/Output Summary (Last 24  hours) at 01/22/13 1115 Last data filed at 01/22/13 0412  Gross per 24 hour  Intake   1750 ml  Output   1175 ml  Net    575 ml     LAB RESULTS: CBC  Recent Labs Lab 01/19/13 1708 01/20/13 0430 01/22/13 0505  WBC  --  8.0 8.9  HGB 12.9* 11.4* 10.5*  HCT 38.0* 35.1* 31.5*  PLT  --  121* 122*  MCV  --  89.5 87.0  MCH  --  29.1 29.0  MCHC  --  32.5 33.3  RDW  --  14.2 13.7  LYMPHSABS  --  1.9  --   MONOABS  --  0.8  --   EOSABS  --  0.1  --   BASOSABS  --  0.0  --     Chemistries   Recent Labs Lab 01/16/13 0720 01/19/13 1708  01/20/13 0430 01/22/13 0505  NA 141 139 135 137  K 5.1 4.2 4.4 4.3  CL 101 102 99 100  CO2 32  --  29 23  GLUCOSE 97 95 94 158*  BUN 15 17 19 20   CREATININE 1.69* 1.40* 1.63* 1.68*  CALCIUM 9.3  --  9.0 8.7    CBG:  Recent Labs Lab 01/19/13 2047  GLUCAP 97    GFR Estimated Creatinine Clearance: 40.4 ml/min (by C-G formula based on Cr of 1.68).  Coagulation profile  Recent Labs Lab 01/19/13 2048  INR 1.02    Cardiac Enzymes No results found for this basename: CK, CKMB, TROPONINI, MYOGLOBIN,  in the last 168 hours  No components found with this basename: POCBNP,  No results found for this basename: DDIMER,  in the last 72 hours No results found for this basename: HGBA1C,  in the last 72 hours No results found for this basename: CHOL, HDL, LDLCALC, TRIG, CHOLHDL, LDLDIRECT,  in the last 72 hours No results found for this basename: TSH, T4TOTAL, FREET3, T3FREE, THYROIDAB,  in the last 72 hours No results found for this basename: VITAMINB12, FOLATE, FERRITIN, TIBC, IRON, RETICCTPCT,  in the last 72 hours No results found for this basename: LIPASE, AMYLASE,  in the last 72 hours  Urine Studies No results found for this basename: UACOL, UAPR, USPG, UPH, UTP, UGL, UKET, UBIL, UHGB, UNIT, UROB, ULEU, UEPI, UWBC, URBC, UBAC, CAST, CRYS, UCOM, BILUA,  in the last 72 hours  MICROBIOLOGY: Recent Results (from the past 240 hour(s))  URINE CULTURE     Status: None   Collection Time    01/19/13  8:37 PM      Result Value Range Status   Specimen Description URINE, RANDOM   Final   Special Requests NONE   Final   Culture  Setup Time 01/19/2013 21:31   Final   Colony Count NO GROWTH   Final   Culture NO GROWTH   Final   Report Status 01/20/2013 FINAL   Final  SURGICAL PCR SCREEN     Status: None   Collection Time    01/19/13  8:38 PM      Result Value Range Status   MRSA, PCR NEGATIVE  NEGATIVE Final   Staphylococcus aureus NEGATIVE  NEGATIVE Final   Comment:             The Xpert SA Assay (FDA     approved for NASAL specimens     in patients over 76 years of age),     is one component of     a comprehensive  surveillance     program.  Test performance has     been validated by [facility] for patients greater     than or equal to 25 year old.     It is not intended     to diagnose infection nor to     guide or monitor treatment.  SURGICAL PCR SCREEN     Status: None   Collection Time    01/21/13  6:43 AM      Result Value Range Status   MRSA, PCR NEGATIVE  NEGATIVE Final   Staphylococcus aureus NEGATIVE  NEGATIVE Final   Comment:            The Xpert SA Assay (FDA     approved for NASAL specimens     in patients over 72 years of age),     is one component of     a comprehensive surveillance     program.  Test performance has     been validated by [employer] for patients greater     than or equal to 42 year old.     It is not intended     to diagnose infection nor to     guide or monitor treatment.    RADIOLOGY STUDIES/RESULTS: Dg Hip Complete Left  01/19/2013   *RADIOLOGY REPORT*  Clinical Data: Hip pain.  No known trauma.  Unable to bear weight on left lower extremity.  LEFT HIP - COMPLETE 2+ VIEW  Comparison: CT of the abdomen and pelvis 01/13/2013  Findings: No evidence for acute fracture or subluxation.  Patient has had aorto iliac stent placement.  Regional bowel gas pattern is nonobstructive.  IMPRESSION: Negative exam.   Original Report Authenticated By: Norva Pavlov, M.D.   Mr Hip Left Wo Contrast  01/20/2013   *RADIOLOGY REPORT*  Clinical Data: Chronic left hip pain, severe today.  No known injury.  Question fracture.  MRI OF THE LEFT HIP WITHOUT CONTRAST  Technique:  Multiplanar, multisequence MR imaging was performed. No intravenous contrast was administered.  Comparison: Radiographs same date.  Pelvic CT 01/13/2013 and 01/19/2013.  Findings: There is heterogeneous T2 hyperintensity throughout the left femoral head and  neck.  On the T1-weighted images, there is serpiginous low T1 marrow signal extending from the femoral head into the femoral neck.  No cortical displacement or subchondral collapse of the femoral head is identified.  Typical findings of femoral head avascular necrosis are not demonstrated.  There is a moderate left hip joint effusion.  The left acetabulum appears normal.  The right femoral head and hip joint appear normal.  There is no evidence of pelvic fracture.  Degenerative changes are present within the lower lumbar spine. The gluteus, hamstring and iliopsoas tendons appear normal bilaterally.  The visualized internal pelvic contents appear unremarkable.  IMPRESSION:  1. Abnormal appearance of the left femoral head and neck with heterogeneous edema, serpiginous low T1 signal and associated hip joint effusion.  In the appropriate clinical context, these findings could reflect a nondisplaced fracture.  However, in the absence of trauma, atypical avascular necrosis should be considered.  The left femoral head and neck do appear somewhat sclerotic on current and prior CTs, and no displaced fracture is demonstrated by CT. 2.  Lower lumbar spondylosis. 3.  No significant muscular or tendon findings.   Original Report Authenticated By: Carey Bullocks, M.D.   Dg Chest Port 1 View  01/19/2013   *RADIOLOGY REPORT*  Clinical Data: Check for infiltrates.  Preop.  PORTABLE CHEST - 1 VIEW  Comparison: 12/11/2012  Findings: The patient has had median sternotomy CABG.  Heart is normal in size.  No focal consolidations or pleural effusions.  No pulmonary edema. Degenerative changes are seen in the spine.  IMPRESSION: No evidence for acute cardiopulmonary abnormality.   Original Report Authenticated By: Norva Pavlov, M.D.   Dg Knee Complete 4 Views Left  01/19/2013   *RADIOLOGY REPORT*  Clinical Data: Left hip pain.  Pain in the hip and knee.  No known trauma.  LEFT KNEE - COMPLETE 4+ VIEW  Comparison: None.  Findings:  No acute fracture or subluxation. No joint effusion. There is atherosclerotic calcification of the popliteal artery. No radiopaque foreign body or soft tissue gas.  IMPRESSION: No evidence for acute  abnormality.   Original Report Authenticated By: Norva Pavlov, M.D.   Ct Cta Abd/pel W/cm &/or W/o Cm  01/19/2013   *RADIOLOGY REPORT*  Clinical Data:  Evaluate recent abdominal aortic aneurysm repair. Hip pain.  CT ANGIOGRAPHY ABDOMEN AND PELVIS  Technique:  Multidetector CT imaging of the abdomen and pelvis was performed using the standard protocol during bolus administration of intravenous contrast.  Multiplanar reconstructed images including MIPs were obtained and reviewed to evaluate the vascular anatomy.  Contrast: OMNIPAQUE IOHEXOL 350 MG/ML SOLN  Comparison:  CT 01/13/2013  Findings:  An infrarenal aortobiiliac endoluminal stent graft is again noted.  Graft is widely patent without evidence of Endo leak. The excluded aneurysm sac measures 51 by 49 mm not changed from 53 x 46 mm on prior.  The celiac trunk, SMA, and renal arteries are patent.  The IMA is sacrificed and back-fills.  The iliac arteries are normal.  Lung bases are clear.  No pericardial fluid.  No focal hepatic lesion.  Post cholecystectomy.  The pancreas, spleen, adrenal glands, kidneys are normal.  Kidneys enhance symmetrically.  The stomach contains a small hiatal hernia.  The small bowel is normal.  The appendix is normal.  Diverticula descending colon. Diverticula of the sigmoid colon without acute inflammation.  No free fluid the pelvis.  The bladder is distended.  Prostate gland is normal.  Review bone windows demonstrates no acute findings.  No acute findings of the right hip.  No evidence of hematoma.   Review of the MIP images confirms the above findings.  IMPRESSION:  1.  No complication following Endograft repair of abdominal aortic aneurysm. 2.  No acute findings of the right hip.  3.  Colonic diverticulosis without  diverticulitis.   Original Report Authenticated By: Genevive Bi, M.D.   Ct Angio Abd/pel W/ And/or W/o  01/13/2013   *RADIOLOGY REPORT*  Clinical Data:  Recent stent graft placement.  Left leg and hip discomfort.  CT ANGIOGRAPHY ABDOMEN AND PELVIS  Technique:  Multidetector CT imaging of the abdomen and pelvis was performed using the standard protocol during bolus administration of intravenous contrast.  Multiplanar reconstructed images including MIPs were obtained and reviewed to evaluate the vascular anatomy.  Contrast: OMNIPAQUE IOHEXOL 350 MG/ML SOLN  Comparison:  11/04/2012  Arterial findings: Aorta:                  Minimal plaque in the visualized distal descending thoracic and suprarenal segments.  Interval placement of bifurcated infrarenal stent graft; negative for endoleak.  Excluded native aneurysm sac diameter 4.6 x 5.3 cm, stable.  Celiac axis:            Mild  eccentric ostial plaque extending over a length of approximately 12 mm resulting in less than 50% diameter stenosis, unremarkable distal branching.  Superior mesenteric:Patent, unremarkable distal branching anatomy.  Left renal:             Single, patent.  Right renal:            Single, with short segment ostial stenosis resulting in at least 50% diameter narrowing.  Patent distally.  Inferior mesenteric:Origin occlusion, reconstituted distally by visceral collaterals.  Left iliac:             Left limb of the stent graft extends to the mid common iliac, normal in caliber.  Mild plaque in the distal common iliac and external iliac without aneurysm or stenosis.  Right iliac:            Right limb stent graft extends to the mid common iliac.  Mild scattered plaque in the distal common iliac and external iliac arteries without aneurysm, dissection or stenosis.  Venous findings:  Patent portal vein, splenic vein, bilateral renal veins, hepatic veins.   Review of the MIP images confirms the above findings.  Nonvascular findings:  Visualized lung bases clear.  Previous median sternotomy.  Vascular clips in the gallbladder fossa.  Unremarkable liver, spleen, adrenal glands, kidneys, pancreas.  Small hiatal hernia.  Stomach, small bowel, and colon are nondilated.  Normal appendix.  Scattered sigmoid diverticula without adjacent inflammatory/edematous change.  Urinary bladder physiologically distended.  Mild prostatic enlargement.  No ascites.  No free air. No adenopathy.  Mild spondylitic changes L4-5.  IMPRESSION:  1.  Interval placement of infrarenal bifurcated aortic stent graft without apparent complication.   Original Report Authenticated By: D. Andria Rhein, MD    Clint Lipps, MD  [facility] Pager:[phone]  If 7PM-7AM, please contact night-coverage [email or web address] Password [email or web address] 01/22/2013, 11:15 AM   LOS: 3 days

## 2013-01-23 ENCOUNTER — Encounter (HOSPITAL_COMMUNITY): Payer: Self-pay | Admitting: Orthopedic Surgery

## 2013-01-23 ENCOUNTER — Ambulatory Visit (HOSPITAL_COMMUNITY): Payer: Medicare Other

## 2013-01-23 DIAGNOSIS — K219 Gastro-esophageal reflux disease without esophagitis: Secondary | ICD-10-CM

## 2013-01-23 DIAGNOSIS — M25559 Pain in unspecified hip: Secondary | ICD-10-CM

## 2013-01-23 LAB — CBC
Hemoglobin: 9 g/dL — ABNORMAL LOW (ref 13.0–17.0)
MCH: 28.8 pg (ref 26.0–34.0)
MCHC: 33.3 g/dL (ref 30.0–36.0)
MCV: 86.5 fL (ref 78.0–100.0)
RBC: 3.12 MIL/uL — ABNORMAL LOW (ref 4.22–5.81)

## 2013-01-23 LAB — BASIC METABOLIC PANEL
CO2: 28 mEq/L (ref 19–32)
Calcium: 8.2 mg/dL — ABNORMAL LOW (ref 8.4–10.5)
Creatinine, Ser: 1.76 mg/dL — ABNORMAL HIGH (ref 0.50–1.35)
GFR calc non Af Amer: 37 mL/min — ABNORMAL LOW (ref >90)
Glucose, Bld: 125 mg/dL — ABNORMAL HIGH (ref 70–99)

## 2013-01-23 MED ORDER — METOPROLOL TARTRATE 50 MG PO TABS
50.0000 mg | ORAL_TABLET | Freq: Two times a day (BID) | ORAL | Status: DC
  Administered 2013-01-24: 50 mg via ORAL
  Filled 2013-01-23 (×2): qty 1

## 2013-01-23 NOTE — Progress Notes (Signed)
Physical Therapy Treatment Patient Details [name]. MRN: [id number] DOB: [birth year] Today's Date: 01/23/2013 Time: 9528-4132 PT Time Calculation (min): 30 min  PT Assessment / Plan / Recommendation Comments on Treatment Session  Pt is moving very well with PT.  S with gait with RW for longer distances and MOD I in room.  Initiated standing HEP with program with illustrated handout.    Follow Up Recommendations  Home health PT     Does the patient have the potential to tolerate intense rehabilitation     Barriers to Discharge        Equipment Recommendations  None recommended by PT    Recommendations for Other Services    Frequency 7X/week   Plan Discharge plan remains appropriate    Precautions / Restrictions Precautions Precautions: None Restrictions Weight Bearing Restrictions: No LLE Weight Bearing: Weight bearing as tolerated   Pertinent Vitals/Pain No c/o pain, just some soreness    Mobility  Transfers Transfers: Sit to Stand;Stand to Sit Sit to Stand: 6: Modified independent (Device/Increase time) Stand to Sit: 6: Modified independent (Device/Increase time) Ambulation/Gait Ambulation/Gait Assistance: 5: Supervision Ambulation Distance (Feet): 300 Feet Assistive device: Rolling walker Gait Pattern: Step-through pattern    Exercises Total Joint Exercises Ankle Circles/Pumps: AROM;Seated;Both Hip ABduction/ADduction: Strengthening;Standing;Seated;Left Knee Flexion: Strengthening;Left;Standing;10 reps Marching in Standing: Both;10 reps;Standing;Strengthening Standing Hip Extension: Strengthening;10 reps;Left   PT Diagnosis:    PT Problem List:   PT Treatment Interventions:     PT Goals Acute Rehab PT Goals PT Goal: Ambulate - Progress: Progressing toward goal Pt will Perform Home Exercise Program: Independently PT Goal: Perform Home Exercise Program - Progress: Goal set today  Visit Information  Assistance Needed: +1    Subjective  Data  Subjective: "I think if home health could come one time that may be good." Patient Stated Goal: get back to walking 2 miles/day   Cognition  Cognition Arousal/Alertness: Awake/alert Behavior During Therapy: WFL for tasks assessed/performed Overall Cognitive Status: Within Functional Limits for tasks assessed    Balance     End of Session PT - End of Session Equipment Utilized During Treatment: Gait belt Activity Tolerance: Patient tolerated treatment well Patient left: in chair;with family/visitor present   GP     [facility] LUBECK 01/23/2013, 10:27 AM

## 2013-01-23 NOTE — Progress Notes (Signed)
   Subjective: 2 Days Post-Op Procedure(s) (LRB): TOTAL HIP ARTHROPLASTY ANTERIOR APPROACH (Left)   Pt seen by Dr. Charlann Boxer Patient reports pain as mild. No events throughout the night.   Objective:   VITALS:   Filed Vitals:   01/23/13  BP: 115/61  Pulse: 96  Temp: 97.4 F (36.3 C)   Resp: 18    Neurovascular intact Dorsiflexion/Plantar flexion intact Incision: dressing C/D/I No cellulitis present Compartment soft  LABS  Recent Labs  01/22/13 0505 01/23/13 0445  HGB 10.5* 9.0*  HCT 31.5* 27.0*  WBC 8.9 15.9*  PLT 122* 129*     Recent Labs  01/22/13 0505 01/23/13 0445  NA 137 131*  K 4.3 4.5  BUN 20 28*  CREATININE 1.68* 1.76*  GLUCOSE 158* 125*     Assessment/Plan: 2 Days Post-Op Procedure(s) (LRB): TOTAL HIP ARTHROPLASTY ANTERIOR APPROACH (Left) Up with therapy Discharge when medically ready Orthopaedically stable WBAT on the left leg ASA 325 mg bid for 4 weeks for anticoagulation Norco Rx written for pain. Follow up in 2 weeks at [facility]. Follow up with OLIN,Feliz Herard D in 2 weeks.  Contact information:  [facility] [address], [address] [place] [address] [phone]       Anastasio Auerbach. Taunya Goral   PAC  01/23/2013, 2:13 PM

## 2013-01-23 NOTE — Progress Notes (Signed)
PATIENT DETAILS [name]. Age: 72 y.o. Sex: male Date of Birth: [birth year] Admit Date: 01/19/2013 Admitting Physician Eduard Clos, MD BJY:NWGNFA,O S, MD  Subjective: Status post left total hip replacement. Pain is controlled. 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 - Likely has moderate risk because of history of CAD and the surgery type. -  Patient understands and desires to proceed with surgery accepting all risks. - Continue with metoprolol. - DVT prophylaxis currently on aspirin 325 mg by mouth twice a day. - Orthopedics please advise about DVT prophylaxis when he goes home (medication for DVT prophylaxis/duration)   History of recent stent graft placement for abdominal aortic aneurysm - No history of abdominal pain - CT of the abdomen on 5/26 shows no complications   CAD status post CABG  - denies any chest pain or shortness of breath. Able to walk 2 miles a day until a week ago. - Continue with aspirin, statin and metoprolol    HYPERLIPIDEMIA - Continue with statin    HYPERTENSION - Controlled with enalapril and metoprolol  Disposition: Remain inpatient  DVT Prophylaxis: Prophylactic Lovenox   Code Status: Full code  Family Communication Wife at bedside  Procedures:  None  CONSULTS:  orthopedic surgery   MEDICATIONS: Scheduled Meds: . aspirin EC  325 mg Oral BID  . atorvastatin  40 mg Oral q1800  . docusate sodium  100 mg Oral BID  . enalapril  15 mg Oral BID  . ferrous sulfate  325 mg Oral TID PC  . metoprolol  50 mg Oral BID  . pantoprazole  40 mg Oral Daily   Continuous Infusions: . sodium chloride 0.9 % 1,000 mL with potassium chloride 10 mEq infusion  75 mL/hr at 01/22/13 0056   PRN Meds:.albuterol, bisacodyl, fentaNYL, HYDROcodone-acetaminophen, menthol-cetylpyridinium, methocarbamol (ROBAXIN) IV, methocarbamol, metoCLOPramide (REGLAN) injection, metoCLOPramide, morphine injection, ondansetron (ZOFRAN) IV, ondansetron, phenol, polyethylene glycol  Antibiotics: Anti-infectives   Start     Dose/Rate Route Frequency Ordered Stop   01/22/13 0300  ceFAZolin (ANCEF) IVPB 2 g/50 mL premix     2 g 100 mL/hr over 30 Minutes Intravenous Every 6 hours 01/22/13 0002 01/22/13 0830   01/21/13 1800  ceFAZolin (ANCEF) IVPB 2 g/50 mL premix     2 g 100 mL/hr over 30 Minutes Intravenous  Once 01/20/13 2150 01/21/13 2120       PHYSICAL EXAM: Vital signs in last 24 hours: Filed Vitals:   01/23/13 0519 01/23/13 0711 01/23/13 1002 01/23/13 1019  BP: 102/50  109/56 115/61  Pulse: 72  87 81  Temp: 98.1 F (36.7 C)   97.4 F (36.3 C)  TempSrc: Oral   Oral  Resp: 16 20  20   Height:      Weight:      SpO2: 96% 95%  95%    Weight change:  Filed Weights   01/19/13 2020  Weight: 87.4 kg (192 lb 10.9 oz)   Body mass index is 27.65 kg/(m^2).   Gen Exam: Awake and alert with clear speech.   Neck: Supple, No JVD.   Chest: B/L Clear.   CVS: S1 S2 Regular, no murmurs. Abdomen: soft, BS +, non tender, non distended.  Extremities: no edema, lower extremities warm to touch. Neurologic: Non Focal.   Skin: No Rash.  Wounds: N/A.    Intake/Output from previous day:  Intake/Output Summary (Last 24 hours) at 01/23/13 1202 Last data filed at 01/23/13 5284  Gross per 24 hour  Intake    480 ml  Output    400 ml  Net     80 ml     LAB RESULTS: CBC  Recent Labs Lab 01/19/13 1708 01/20/13 0430 01/22/13 0505 01/23/13 0445  WBC  --  8.0 8.9 15.9*  HGB 12.9* 11.4* 10.5* 9.0*  HCT 38.0* 35.1* 31.5* 27.0*  PLT  --  121* 122* 129*  MCV  --  89.5 87.0 86.5  MCH  --  29.1 29.0 28.8  MCHC  --  32.5 33.3 33.3  RDW  --  14.2 13.7 14.1   LYMPHSABS  --  1.9  --   --   MONOABS  --  0.8  --   --   EOSABS  --  0.1  --   --   BASOSABS  --  0.0  --   --     Chemistries   Recent Labs Lab 01/19/13 1708 01/20/13 0430 01/22/13 0505 01/23/13 0445  NA 139 135 137 131*  K 4.2 4.4 4.3 4.5  CL 102 99 100 94*  CO2  --  29 23 28   GLUCOSE 95 94 158* 125*  BUN 17 19 20  28*  CREATININE 1.40* 1.63* 1.68* 1.76*  CALCIUM  --  9.0 8.7 8.2*    CBG:  Recent Labs Lab 01/19/13 2047  GLUCAP 97    GFR Estimated Creatinine Clearance: 38.6 ml/min (by C-G formula based on Cr of 1.76).  Coagulation profile  Recent Labs Lab 01/19/13 2048  INR 1.02    Cardiac Enzymes No results found for this basename: CK, CKMB, TROPONINI, MYOGLOBIN,  in the last 168 hours  No components found with this basename: POCBNP,  No results found for this basename: DDIMER,  in the last 72 hours No results found for this basename: HGBA1C,  in the last 72 hours No results found for this basename: CHOL, HDL, LDLCALC, TRIG, CHOLHDL, LDLDIRECT,  in the last 72 hours No results found for this basename: TSH, T4TOTAL, FREET3, T3FREE, THYROIDAB,  in the last 72 hours No results found for this basename: VITAMINB12, FOLATE, FERRITIN, TIBC, IRON, RETICCTPCT,  in the last 72 hours No results found for this basename: LIPASE, AMYLASE,  in the last 72 hours  Urine Studies No results found for this basename: UACOL, UAPR, USPG, UPH, UTP, UGL, UKET, UBIL, UHGB, UNIT, UROB, ULEU, UEPI, UWBC, URBC, UBAC, CAST, CRYS, UCOM, BILUA,  in the last 72 hours  MICROBIOLOGY: Recent Results (from the past 240 hour(s))  URINE CULTURE     Status: None   Collection Time    01/19/13  8:37 PM      Result Value Range Status   Specimen Description URINE, RANDOM   Final   Special Requests NONE   Final   Culture  Setup Time 01/19/2013 21:31   Final   Colony Count NO GROWTH   Final   Culture NO GROWTH   Final   Report Status 01/20/2013 FINAL   Final  SURGICAL PCR SCREEN      Status: None   Collection Time    01/19/13  8:38 PM      Result Value Range Status   MRSA, PCR NEGATIVE  NEGATIVE Final   Staphylococcus aureus NEGATIVE  NEGATIVE Final   Comment:  The Xpert SA Assay (FDA     approved for NASAL specimens     in patients over 40 years of age),     is one component of     a comprehensive surveillance     program.  Test performance has     been validated by [employer] for patients greater     than or equal to 31 year old.     It is not intended     to diagnose infection nor to     guide or monitor treatment.  SURGICAL PCR SCREEN     Status: None   Collection Time    01/21/13  6:43 AM      Result Value Range Status   MRSA, PCR NEGATIVE  NEGATIVE Final   Staphylococcus aureus NEGATIVE  NEGATIVE Final   Comment:            The Xpert SA Assay (FDA     approved for NASAL specimens     in patients over 55 years of age),     is one component of     a comprehensive surveillance     program.  Test performance has     been validated by [employer] for patients greater     than or equal to 64 year old.     It is not intended     to diagnose infection nor to     guide or monitor treatment.    RADIOLOGY STUDIES/RESULTS: Dg Hip Complete Left  01/19/2013   *RADIOLOGY REPORT*  Clinical Data: Hip pain.  No known trauma.  Unable to bear weight on left lower extremity.  LEFT HIP - COMPLETE 2+ VIEW  Comparison: CT of the abdomen and pelvis 01/13/2013  Findings: No evidence for acute fracture or subluxation.  Patient has had aorto iliac stent placement.  Regional bowel gas pattern is nonobstructive.  IMPRESSION: Negative exam.   Original Report Authenticated By: Norva Pavlov, M.D.   Mr Hip Left Wo Contrast  01/20/2013   *RADIOLOGY REPORT*  Clinical Data: Chronic left hip pain, severe today.  No known injury.  Question fracture.  MRI OF THE LEFT HIP WITHOUT CONTRAST  Technique:  Multiplanar, multisequence MR imaging was performed. No  intravenous contrast was administered.  Comparison: Radiographs same date.  Pelvic CT 01/13/2013 and 01/19/2013.  Findings: There is heterogeneous T2 hyperintensity throughout the left femoral head and neck.  On the T1-weighted images, there is serpiginous low T1 marrow signal extending from the femoral head into the femoral neck.  No cortical displacement or subchondral collapse of the femoral head is identified.  Typical findings of femoral head avascular necrosis are not demonstrated.  There is a moderate left hip joint effusion.  The left acetabulum appears normal.  The right femoral head and hip joint appear normal.  There is no evidence of pelvic fracture.  Degenerative changes are present within the lower lumbar spine. The gluteus, hamstring and iliopsoas tendons appear normal bilaterally.  The visualized internal pelvic contents appear unremarkable.  IMPRESSION:  1. Abnormal appearance of the left femoral head and neck with heterogeneous edema, serpiginous low T1 signal and associated hip joint effusion.  In the appropriate clinical context, these findings could reflect a nondisplaced fracture.  However, in the absence of trauma, atypical avascular necrosis should be considered.  The left femoral head and neck do appear somewhat sclerotic on current and prior CTs, and no displaced fracture is  demonstrated by CT. 2.  Lower lumbar spondylosis. 3.  No significant muscular or tendon findings.   Original Report Authenticated By: Carey Bullocks, M.D.   Dg Chest Port 1 View  01/19/2013   *RADIOLOGY REPORT*  Clinical Data: Check for infiltrates.  Preop.  PORTABLE CHEST - 1 VIEW  Comparison: 12/11/2012  Findings: The patient has had median sternotomy CABG.  Heart is normal in size.  No focal consolidations or pleural effusions.  No pulmonary edema. Degenerative changes are seen in the spine.  IMPRESSION: No evidence for acute cardiopulmonary abnormality.   Original Report Authenticated By: Norva Pavlov, M.D.    Dg Knee Complete 4 Views Left  01/19/2013   *RADIOLOGY REPORT*  Clinical Data: Left hip pain.  Pain in the hip and knee.  No known trauma.  LEFT KNEE - COMPLETE 4+ VIEW  Comparison: None.  Findings: No acute fracture or subluxation. No joint effusion. There is atherosclerotic calcification of the popliteal artery. No radiopaque foreign body or soft tissue gas.  IMPRESSION: No evidence for acute  abnormality.   Original Report Authenticated By: Norva Pavlov, M.D.   Ct Cta Abd/pel W/cm &/or W/o Cm  01/19/2013   *RADIOLOGY REPORT*  Clinical Data:  Evaluate recent abdominal aortic aneurysm repair. Hip pain.  CT ANGIOGRAPHY ABDOMEN AND PELVIS  Technique:  Multidetector CT imaging of the abdomen and pelvis was performed using the standard protocol during bolus administration of intravenous contrast.  Multiplanar reconstructed images including MIPs were obtained and reviewed to evaluate the vascular anatomy.  Contrast: OMNIPAQUE IOHEXOL 350 MG/ML SOLN  Comparison:  CT 01/13/2013  Findings:  An infrarenal aortobiiliac endoluminal stent graft is again noted.  Graft is widely patent without evidence of Endo leak. The excluded aneurysm sac measures 51 by 49 mm not changed from 53 x 46 mm on prior.  The celiac trunk, SMA, and renal arteries are patent.  The IMA is sacrificed and back-fills.  The iliac arteries are normal.  Lung bases are clear.  No pericardial fluid.  No focal hepatic lesion.  Post cholecystectomy.  The pancreas, spleen, adrenal glands, kidneys are normal.  Kidneys enhance symmetrically.  The stomach contains a small hiatal hernia.  The small bowel is normal.  The appendix is normal.  Diverticula descending colon. Diverticula of the sigmoid colon without acute inflammation.  No free fluid the pelvis.  The bladder is distended.  Prostate gland is normal.  Review bone windows demonstrates no acute findings.  No acute findings of the right hip.  No evidence of hematoma.   Review of the MIP images  confirms the above findings.  IMPRESSION:  1.  No complication following Endograft repair of abdominal aortic aneurysm. 2.  No acute findings of the right hip.  3.  Colonic diverticulosis without diverticulitis.   Original Report Authenticated By: Genevive Bi, M.D.   Ct Angio Abd/pel W/ And/or W/o  01/13/2013   *RADIOLOGY REPORT*  Clinical Data:  Recent stent graft placement.  Left leg and hip discomfort.  CT ANGIOGRAPHY ABDOMEN AND PELVIS  Technique:  Multidetector CT imaging of the abdomen and pelvis was performed using the standard protocol during bolus administration of intravenous contrast.  Multiplanar reconstructed images including MIPs were obtained and reviewed to evaluate the vascular anatomy.  Contrast: OMNIPAQUE IOHEXOL 350 MG/ML SOLN  Comparison:  11/04/2012  Arterial findings: Aorta:                  Minimal plaque in the visualized distal descending thoracic and  suprarenal segments.  Interval placement of bifurcated infrarenal stent graft; negative for endoleak.  Excluded native aneurysm sac diameter 4.6 x 5.3 cm, stable.  Celiac axis:            Mild eccentric ostial plaque extending over a length of approximately 12 mm resulting in less than 50% diameter stenosis, unremarkable distal branching.  Superior mesenteric:Patent, unremarkable distal branching anatomy.  Left renal:             Single, patent.  Right renal:            Single, with short segment ostial stenosis resulting in at least 50% diameter narrowing.  Patent distally.  Inferior mesenteric:Origin occlusion, reconstituted distally by visceral collaterals.  Left iliac:             Left limb of the stent graft extends to the mid common iliac, normal in caliber.  Mild plaque in the distal common iliac and external iliac without aneurysm or stenosis.  Right iliac:            Right limb stent graft extends to the mid common iliac.  Mild scattered plaque in the distal common iliac and external iliac arteries without aneurysm,  dissection or stenosis.  Venous findings:  Patent portal vein, splenic vein, bilateral renal veins, hepatic veins.   Review of the MIP images confirms the above findings.  Nonvascular findings: Visualized lung bases clear.  Previous median sternotomy.  Vascular clips in the gallbladder fossa.  Unremarkable liver, spleen, adrenal glands, kidneys, pancreas.  Small hiatal hernia.  Stomach, small bowel, and colon are nondilated.  Normal appendix.  Scattered sigmoid diverticula without adjacent inflammatory/edematous change.  Urinary bladder physiologically distended.  Mild prostatic enlargement.  No ascites.  No free air. No adenopathy.  Mild spondylitic changes L4-5.  IMPRESSION:  1.  Interval placement of infrarenal bifurcated aortic stent graft without apparent complication.   Original Report Authenticated By: D. Andria Rhein, MD    Clint Lipps, MD  [facility] Pager:[phone]  If 7PM-7AM, please contact night-coverage [email or web address] Password [email or web address] 01/23/2013, 12:02 PM   LOS: 4 days

## 2013-01-23 NOTE — Progress Notes (Signed)
Physical Therapy Treatment Patient Details Name: Neri Samek. MRN: [id number] DOB: 10-Aug-1941 Today's Date: 01/23/2013 Time: 4098-1191 PT Time Calculation (min): 15 min  PT Assessment / Plan / Recommendation Comments on Treatment Session  Pt able to amb safely with or without walker.  Pt has met all acute care goals and needs no further acute PT.    Follow Up Recommendations  Home health PT (for home eval)     Does the patient have the potential to tolerate intense rehabilitation     Barriers to Discharge        Equipment Recommendations  None recommended by PT    Recommendations for Other Services    Frequency     Plan All goals met and education completed, patient dischaged from PT services;Discharge plan remains appropriate    Precautions / Restrictions Precautions Precautions: None Restrictions LLE Weight Bearing: Weight bearing as tolerated   Pertinent Vitals/Pain Slight lt hip soreness.    Mobility  Transfers Sit to Stand: 7: Independent;With upper extremity assist;With armrests;From chair/3-in-1 Stand to Sit: 7: Independent;With upper extremity assist;With armrests;To chair/3-in-1 Ambulation/Gait Ambulation/Gait Assistance: 6: Modified independent (Device/Increase time) Ambulation Distance (Feet): 400 Feet Assistive device: Rolling walker;None Ambulation/Gait Assistance Details: Pt able to amb with or without the walker safely. Gait Pattern: Step-to pattern    Exercises Total Joint Exercises Hip ABduction/ADduction: Strengthening;Left;10 reps;Standing Knee Flexion: Strengthening;Left;Standing;10 reps Marching in Standing: Both;10 reps;Standing;Strengthening   PT Diagnosis:    PT Problem List:   PT Treatment Interventions:     PT Goals Acute Rehab PT Goals PT Goal: Ambulate - Progress: Met PT Goal: Perform Home Exercise Program - Progress: Met  Visit Information  Last PT Received On: 01/23/13 Assistance Needed: +1    Subjective Data  Subjective: Pt states he feels good without the walker.   Cognition  Cognition Arousal/Alertness: Awake/alert Behavior During Therapy: WFL for tasks assessed/performed Overall Cognitive Status: Within Functional Limits for tasks assessed    Balance  Static Standing Balance Static Standing - Balance Support: No upper extremity supported Static Standing - Level of Assistance: 7: Independent  End of Session PT - End of Session Activity Tolerance: Patient tolerated treatment well Patient left: in chair;with call bell/phone within reach;with family/visitor present Nurse Communication: Mobility status   GP     Zonie Crutcher 01/23/2013, 3:08 PM  [employer] PT [phone]

## 2013-01-24 ENCOUNTER — Encounter (HOSPITAL_COMMUNITY): Payer: Self-pay | Admitting: Internal Medicine

## 2013-01-24 LAB — BASIC METABOLIC PANEL
Chloride: 97 mEq/L (ref 96–112)
GFR calc Af Amer: 41 mL/min — ABNORMAL LOW (ref >90)
Potassium: 4.8 mEq/L (ref 3.5–5.1)
Sodium: 134 mEq/L — ABNORMAL LOW (ref 135–145)

## 2013-01-24 LAB — CBC
HCT: 26.1 % — ABNORMAL LOW (ref 39.0–52.0)
Platelets: 124 10*3/uL — ABNORMAL LOW (ref 150–400)
RDW: 14.2 % (ref 11.5–15.5)
WBC: 10.4 10*3/uL (ref 4.0–10.5)

## 2013-01-24 MED ORDER — ASPIRIN EC 325 MG PO TBEC: 325.0000 mg | DELAYED_RELEASE_TABLET | Freq: Two times a day (BID) | ORAL | Status: DC

## 2013-01-24 MED ORDER — HYDROCODONE-ACETAMINOPHEN 5-325 MG PO TABS: 1.0000 | ORAL_TABLET | Freq: Four times a day (QID) | ORAL | Status: DC | PRN

## 2013-01-24 MED ORDER — HYDROCODONE-ACETAMINOPHEN 5-325 MG PO TABS: 1.0000 | ORAL_TABLET | ORAL | Status: DC | PRN

## 2013-01-24 NOTE — Discharge Summary (Signed)
Physician Discharge Summary  Ashley Royalty. ZOX:[id number] DOB: 05/29/1941 DOA: 01/19/2013  PCP: Harlow Asa, MD  Admit date: 01/19/2013 Discharge date: 01/24/2013  Time spent: 40 minutes  Recommendations for Outpatient Follow-up:  1. Followup with Dr. Charlann Boxer 2. Followup with primary care physician in one week.  Discharge Diagnoses:  Principal Problem:   Hip fracture, left Active Problems:   HYPERLIPIDEMIA   HYPERTENSION   Cerebrovascular disease   CAD (coronary artery disease)   Discharge Condition: Stable  Diet recommendation: Heart healthy diet  Filed Weights   01/19/13 2020  Weight: 87.4 kg (192 lb 10.9 oz)    History of present illness:  Zenon Leaf. is a very pleasant 72 y.o. male who has had a recent abdominal aortic aneurysm repair and was discharged last week was doing fine until today morning when he started developing increasing pain on moving his left hip. Patient states that he's been having chronic left hip pain but today it worsened severely with minimal movement. Denies any trauma or fall. In the ER MRI of the left hip was done and showed left femoral head fracture at this time is admitted for further management. Orthopedic surgeon on call has been consulted. Patient denies any abdominal pain chest pain nausea vomiting diarrhea shortness of breath.  Hospital Course:   1. 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 After patient admitted to the hospital orthopedics was consulted, patient has history of CAD/CABG but has excellent functional status. Patient is on metoprolol that was continued throughout his hospital stay. Left total hip replacement was done on 01/21/2013 by Dr. Charlann Boxer. The patient did very well afterwards with physical therapy and they recommended to discharge him home with home physical therapy. Orthopedics recommended aspirin 325 mg by mouth twice a day for 4 weeks for DVT prophylaxis. Patient did have a  Hemovac, on the day of discharge it was leaking, Dr. Toni Arthurs so the patient prior to discharge and discontinued the Hemovac and okayed the patient for discharge.  2. History of AAA: Denies abdominal pain, CT scan of abdomen pelvis done on 01/19/2013 shows no complications.  3. CAD status post CABG: Denies any chest pain, this is stable. Patient reported that he walks about 2 miles daily. He is on metoprolol, aspirin, enalapril and statin that was continued throughout the hospital stay.  4. Hypertension: Controlled with enalapril and metoprolol.  Procedures:  Left total hip replacement done by Dr. Charlann Boxer on 01/21/2013  Consultations:  Charlann Boxer   Discharge Exam: Filed Vitals:   01/23/13 2322 01/24/13 0426 01/24/13 0800 01/24/13 1014  BP: 117/61 121/55  122/54  Pulse: 75 80  80  Temp:  98.1 F (36.7 C)    TempSrc:      Resp:  18 16   Height:      Weight:      SpO2: 97% 96% 96%    General: Alert and awake, oriented x3, not in any acute distress. HEENT: anicteric sclera, pupils reactive to light and accommodation, EOMI CVS: S1-S2 clear, no murmur rubs or gallops Chest: clear to auscultation bilaterally, no wheezing, rales or rhonchi Abdomen: soft nontender, nondistended, normal bowel sounds, no organomegaly Extremities: no cyanosis, clubbing or edema noted bilaterally Neuro: Cranial nerves II-XII intact, no focal neurological deficits  Discharge Instructions  Discharge Orders   Future Appointments Provider Department Dept Phone   07/21/2013 9:30 AM [facility] [place] [facility] [phone]   07/21/2013 11:30 AM [facility]  [facility] [phone]   07/21/2013 12:30 PM Pryor Ochoa, MD [facility] [phone]   Future Orders Complete By Expires     Call MD / Call 911  As directed     Comments:      If you experience chest pain or shortness of breath, CALL 911 and be transported to the  hospital emergency room.  If you develope a fever above 101 F, pus (white drainage) or increased drainage or redness at the wound, or calf pain, call your surgeon's office.    Change dressing  As directed     Comments:      Maintain surgical dressing for 10-14 days, then replace with 4x4 guaze and tape. Keep the area dry and clean.    Constipation Prevention  As directed     Comments:      Drink plenty of fluids.  Prune juice may be helpful.  You may use a stool softener, such as Colace (over the counter) 100 mg twice a day.  Use MiraLax (over the counter) for constipation as needed.    Discharge instructions  As directed     Comments:      Maintain surgical dressing for 10-14 days, then replace with gauze and tape. Keep the area dry and clean until follow up. Follow up in 2 weeks at [facility]. Call with any questions or concerns.    Increase activity slowly as tolerated  As directed     TED hose  As directed     Comments:      Use stockings (TED hose) for 2 weeks on both leg(s).  You may remove them at night for sleeping.    Weight bearing as tolerated  As directed         Medication List    TAKE these medications       acetaminophen 500 MG tablet  Commonly known as:  TYLENOL  Take 1,000 mg by mouth every 6 (six) hours as needed for pain.     albuterol 108 (90 BASE) MCG/ACT inhaler  Commonly known as:  PROVENTIL HFA;VENTOLIN HFA  Inhale 2 puffs into the lungs every 6 (six) hours as needed for wheezing.     aspirin EC 325 MG tablet  Take 1 tablet (325 mg total) by mouth 2 (two) times daily.     enalapril 10 MG tablet  Commonly known as:  VASOTEC  Take 15 mg by mouth 2 (two) times daily.     hydrochlorothiazide 25 MG tablet  Commonly known as:  HYDRODIURIL  Take 12.5 mg by mouth daily.     HYDROcodone-acetaminophen 5-325 MG per tablet  Commonly known as:  NORCO/VICODIN  Take 1-2 tablets by mouth every 4 (four) hours as needed for pain.     metoprolol 50 MG  tablet  Commonly known as:  LOPRESSOR  Take 50 mg by mouth 2 (two) times daily.     omeprazole 20 MG capsule  Commonly known as:  PRILOSEC  Take 20 mg by mouth daily.     simvastatin 80 MG tablet  Commonly known as:  ZOCOR  Take 40 mg by mouth at bedtime.       Allergies  Allergen Reactions  . Neomycin        Follow-up Information   Follow up with Shelda Pal, MD. Schedule an appointment as soon as possible for a visit in 2 weeks.   Contact information:   [address], [address] [employer]  [place] [address] [phone]       Follow up with Harlow Asa, MD In 1 week.   Contact information:   [address] [place] [address] [phone]        The results of significant diagnostics from this hospitalization (including imaging, microbiology, ancillary and laboratory) are listed below for reference.    Significant Diagnostic Studies: Dg Hip Complete Left  01/19/2013   *RADIOLOGY REPORT*  Clinical Data: Hip pain.  No known trauma.  Unable to bear weight on left lower extremity.  LEFT HIP - COMPLETE 2+ VIEW  Comparison: CT of the abdomen and pelvis 01/13/2013  Findings: No evidence for acute fracture or subluxation.  Patient has had aorto iliac stent placement.  Regional bowel gas pattern is nonobstructive.  IMPRESSION: Negative exam.   Original Report Authenticated By: Norva Pavlov, M.D.   Dg Pelvis Portable  01/22/2013   *RADIOLOGY REPORT*  Clinical Data: Postoperative examination (left total hip replacement)  PORTABLE PELVIS  Comparison: Cross-table lateral radiograph of the left hip- earlier same day  Findings:  This examination was interpreted in conjunction with cross-table lateral radiograph of the left hip performed earlier same day.  Post left total hip replacement.  No evidence of hardware failure or loosening.  No fracture.  A surgical drain overlies the superior lateral aspect of the operative site.  There are several expected foci of subcutaneous  emphysema.  No radiopaque foreign body.  Vascular calcifications.  Vascular stents overlying the expected location of the bilateral common iliac arteries, incompletely evaluated.  IMPRESSION: Post left total hip replacement without evidence of complication.   Original Report Authenticated By: Tacey Ruiz, MD   Mr Hip Left Wo Contrast  01/20/2013   *RADIOLOGY REPORT*  Clinical Data: Chronic left hip pain, severe today.  No known injury.  Question fracture.  MRI OF THE LEFT HIP WITHOUT CONTRAST  Technique:  Multiplanar, multisequence MR imaging was performed. No intravenous contrast was administered.  Comparison: Radiographs same date.  Pelvic CT 01/13/2013 and 01/19/2013.  Findings: There is heterogeneous T2 hyperintensity throughout the left femoral head and neck.  On the T1-weighted images, there is serpiginous low T1 marrow signal extending from the femoral head into the femoral neck.  No cortical displacement or subchondral collapse of the femoral head is identified.  Typical findings of femoral head avascular necrosis are not demonstrated.  There is a moderate left hip joint effusion.  The left acetabulum appears normal.  The right femoral head and hip joint appear normal.  There is no evidence of pelvic fracture.  Degenerative changes are present within the lower lumbar spine. The gluteus, hamstring and iliopsoas tendons appear normal bilaterally.  The visualized internal pelvic contents appear unremarkable.  IMPRESSION:  1. Abnormal appearance of the left femoral head and neck with heterogeneous edema, serpiginous low T1 signal and associated hip joint effusion.  In the appropriate clinical context, these findings could reflect a nondisplaced fracture.  However, in the absence of trauma, atypical avascular necrosis should be considered.  The left femoral head and neck do appear somewhat sclerotic on current and prior CTs, and no displaced fracture is demonstrated by CT. 2.  Lower lumbar spondylosis. 3.  No  significant muscular or tendon findings.   Original Report Authenticated By: Carey Bullocks, M.D.   Dg Chest Port 1 View  01/19/2013   *RADIOLOGY REPORT*  Clinical Data: Check for infiltrates.  Preop.  PORTABLE CHEST - 1 VIEW  Comparison: 12/11/2012  Findings: The patient has  had median sternotomy CABG.  Heart is normal in size.  No focal consolidations or pleural effusions.  No pulmonary edema. Degenerative changes are seen in the spine.  IMPRESSION: No evidence for acute cardiopulmonary abnormality.   Original Report Authenticated By: Norva Pavlov, M.D.   Dg Hip Portable 1 View Left  01/22/2013   *RADIOLOGY REPORT*  Clinical Data: Postoperative examination (left total hip replacement  PORTABLE LEFT HIP - 1 VIEW  Comparison: AP pelvic radiographs - earlier same day  Findings:  This examination was interpreted in conjunction with AP pelvic radiograph performed earlier same day.  Post left total hip replacement.  No evidence of hardware failure or loosening.  No fracture.  A surgical drain overlies the superior lateral aspect of the operative site.  There are several expected foci of subcutaneous emphysema.  No radiopaque foreign body.  Vascular calcifications.  Vascular stents overlying the expected location of the bilateral common iliac arteries, incompletely evaluated.  IMPRESSION:  Post left total hip replacement without evidence of complication.   Original Report Authenticated By: Tacey Ruiz, MD   Dg Knee Complete 4 Views Left  01/19/2013   *RADIOLOGY REPORT*  Clinical Data: Left hip pain.  Pain in the hip and knee.  No known trauma.  LEFT KNEE - COMPLETE 4+ VIEW  Comparison: None.  Findings: No acute fracture or subluxation. No joint effusion. There is atherosclerotic calcification of the popliteal artery. No radiopaque foreign body or soft tissue gas.  IMPRESSION: No evidence for acute  abnormality.   Original Report Authenticated By: Norva Pavlov, M.D.   Dg C-arm 1-60 Min-no  Report  01/22/2013   CLINICAL DATA: ORIF left hip   C-ARM 1-60 MINUTES  Fluoroscopy was utilized by the requesting physician.  No radiographic  interpretation.    Ct Cta Abd/pel W/cm &/or W/o Cm  01/19/2013   *RADIOLOGY REPORT*  Clinical Data:  Evaluate recent abdominal aortic aneurysm repair. Hip pain.  CT ANGIOGRAPHY ABDOMEN AND PELVIS  Technique:  Multidetector CT imaging of the abdomen and pelvis was performed using the standard protocol during bolus administration of intravenous contrast.  Multiplanar reconstructed images including MIPs were obtained and reviewed to evaluate the vascular anatomy.  Contrast: OMNIPAQUE IOHEXOL 350 MG/ML SOLN  Comparison:  CT 01/13/2013  Findings:  An infrarenal aortobiiliac endoluminal stent graft is again noted.  Graft is widely patent without evidence of Endo leak. The excluded aneurysm sac measures 51 by 49 mm not changed from 53 x 46 mm on prior.  The celiac trunk, SMA, and renal arteries are patent.  The IMA is sacrificed and back-fills.  The iliac arteries are normal.  Lung bases are clear.  No pericardial fluid.  No focal hepatic lesion.  Post cholecystectomy.  The pancreas, spleen, adrenal glands, kidneys are normal.  Kidneys enhance symmetrically.  The stomach contains a small hiatal hernia.  The small bowel is normal.  The appendix is normal.  Diverticula descending colon. Diverticula of the sigmoid colon without acute inflammation.  No free fluid the pelvis.  The bladder is distended.  Prostate gland is normal.  Review bone windows demonstrates no acute findings.  No acute findings of the right hip.  No evidence of hematoma.   Review of the MIP images confirms the above findings.  IMPRESSION:  1.  No complication following Endograft repair of abdominal aortic aneurysm. 2.  No acute findings of the right hip.  3.  Colonic diverticulosis without diverticulitis.   Original Report Authenticated By: Genevive Bi, M.D.  Ct Angio Abd/pel W/ And/or  W/o  01/13/2013   *RADIOLOGY REPORT*  Clinical Data:  Recent stent graft placement.  Left leg and hip discomfort.  CT ANGIOGRAPHY ABDOMEN AND PELVIS  Technique:  Multidetector CT imaging of the abdomen and pelvis was performed using the standard protocol during bolus administration of intravenous contrast.  Multiplanar reconstructed images including MIPs were obtained and reviewed to evaluate the vascular anatomy.  Contrast: OMNIPAQUE IOHEXOL 350 MG/ML SOLN  Comparison:  11/04/2012  Arterial findings: Aorta:                  Minimal plaque in the visualized distal descending thoracic and suprarenal segments.  Interval placement of bifurcated infrarenal stent graft; negative for endoleak.  Excluded native aneurysm sac diameter 4.6 x 5.3 cm, stable.  Celiac axis:            Mild eccentric ostial plaque extending over a length of approximately 12 mm resulting in less than 50% diameter stenosis, unremarkable distal branching.  Superior mesenteric:Patent, unremarkable distal branching anatomy.  Left renal:             Single, patent.  Right renal:            Single, with short segment ostial stenosis resulting in at least 50% diameter narrowing.  Patent distally.  Inferior mesenteric:Origin occlusion, reconstituted distally by visceral collaterals.  Left iliac:             Left limb of the stent graft extends to the mid common iliac, normal in caliber.  Mild plaque in the distal common iliac and external iliac without aneurysm or stenosis.  Right iliac:            Right limb stent graft extends to the mid common iliac.  Mild scattered plaque in the distal common iliac and external iliac arteries without aneurysm, dissection or stenosis.  Venous findings:  Patent portal vein, splenic vein, bilateral renal veins, hepatic veins.   Review of the MIP images confirms the above findings.  Nonvascular findings: Visualized lung bases clear.  Previous median sternotomy.  Vascular clips in the gallbladder fossa.   Unremarkable liver, spleen, adrenal glands, kidneys, pancreas.  Small hiatal hernia.  Stomach, small bowel, and colon are nondilated.  Normal appendix.  Scattered sigmoid diverticula without adjacent inflammatory/edematous change.  Urinary bladder physiologically distended.  Mild prostatic enlargement.  No ascites.  No free air. No adenopathy.  Mild spondylitic changes L4-5.  IMPRESSION:  1.  Interval placement of infrarenal bifurcated aortic stent graft without apparent complication.   Original Report Authenticated By: D. Andria Rhein, MD    Microbiology: Recent Results (from the past 240 hour(s))  URINE CULTURE     Status: None   Collection Time    01/19/13  8:37 PM      Result Value Range Status   Specimen Description URINE, RANDOM   Final   Special Requests NONE   Final   Culture  Setup Time 01/19/2013 21:31   Final   Colony Count NO GROWTH   Final   Culture NO GROWTH   Final   Report Status 01/20/2013 FINAL   Final  SURGICAL PCR SCREEN     Status: None   Collection Time    01/19/13  8:38 PM      Result Value Range Status   MRSA, PCR NEGATIVE  NEGATIVE Final   Staphylococcus aureus NEGATIVE  NEGATIVE Final   Comment:  The Xpert SA Assay (FDA     approved for NASAL specimens     in patients over 30 years of age),     is one component of     a comprehensive surveillance     program.  Test performance has     been validated by [employer] for patients greater     than or equal to 64 year old.     It is not intended     to diagnose infection nor to     guide or monitor treatment.  SURGICAL PCR SCREEN     Status: None   Collection Time    01/21/13  6:43 AM      Result Value Range Status   MRSA, PCR NEGATIVE  NEGATIVE Final   Staphylococcus aureus NEGATIVE  NEGATIVE Final   Comment:            The Xpert SA Assay (FDA     approved for NASAL specimens     in patients over 4 years of age),     is one component of     a comprehensive surveillance     program.   Test performance has     been validated by [employer] for patients greater     than or equal to 38 year old.     It is not intended     to diagnose infection nor to     guide or monitor treatment.     Labs: Basic Metabolic Panel:  Recent Labs Lab 01/19/13 1708 01/20/13 0430 01/22/13 0505 01/23/13 0445 01/24/13 0545  NA 139 135 137 131* 134*  K 4.2 4.4 4.3 4.5 4.8  CL 102 99 100 94* 97  CO2  --  29 23 28 31   GLUCOSE 95 94 158* 125* 99  BUN 17 19 20  28* 29*  CREATININE 1.40* 1.63* 1.68* 1.76* 1.83*  CALCIUM  --  9.0 8.7 8.2* 8.4   Liver Function Tests: No results found for this basename: AST, ALT, ALKPHOS, BILITOT, PROT, ALBUMIN,  in the last 168 hours No results found for this basename: LIPASE, AMYLASE,  in the last 168 hours No results found for this basename: AMMONIA,  in the last 168 hours CBC:  Recent Labs Lab 01/19/13 1708 01/20/13 0430 01/22/13 0505 01/23/13 0445 01/24/13 0545  WBC  --  8.0 8.9 15.9* 10.4  NEUTROABS  --  5.2  --   --   --   HGB 12.9* 11.4* 10.5* 9.0* 8.5*  HCT 38.0* 35.1* 31.5* 27.0* 26.1*  MCV  --  89.5 87.0 86.5 87.0  PLT  --  121* 122* 129* 124*   Cardiac Enzymes: No results found for this basename: CKTOTAL, CKMB, CKMBINDEX, TROPONINI,  in the last 168 hours BNP: BNP (last 3 results) No results found for this basename: PROBNP,  in the last 8760 hours CBG:  Recent Labs Lab 01/19/13 2047  GLUCAP 97       Signed:  Jamarquis Crull A  [facility] 01/24/2013, 12:22 PM

## 2013-01-24 NOTE — Progress Notes (Signed)
Subjective: 3 Days Post-Op Procedure(s) (LRB): TOTAL HIP ARTHROPLASTY ANTERIOR APPROACH (Left) Patient reports pain as mild.  Drain still in L hip.  Objective: Vital signs in last 24 hours: Temp:  [97.5 F (36.4 C)-98.1 F (36.7 C)] 98.1 F (36.7 C) (05/31 0426) Pulse Rate:  [66-80] 80 (05/31 1014) Resp:  [16-20] 16 (05/31 0800) BP: (98-122)/(54-61) 122/54 mmHg (05/31 1014) SpO2:  [96 %-98 %] 96 % (05/31 0800)  Intake/Output from previous day: 05/30 0701 - 05/31 0700 In: 480 [P.O.:480] Out: 295 [Drains:295] Intake/Output this shift:     Recent Labs  01/22/13 0505 01/23/13 0445 01/24/13 0545  HGB 10.5* 9.0* 8.5*    Recent Labs  01/23/13 0445 01/24/13 0545  WBC 15.9* 10.4  RBC 3.12* 3.00*  HCT 27.0* 26.1*  PLT 129* 124*    Recent Labs  01/23/13 0445 01/24/13 0545  NA 131* 134*  K 4.5 4.8  CL 94* 97  CO2 28 31  BUN 28* 29*  CREATININE 1.76* 1.83*  GLUCOSE 125* 99  CALCIUM 8.2* 8.4   No results found for this basename: LABPT, INR,  in the last 72 hours  PE:  L hip wound dressed and dry.  SS drainage from the wound around the drain tube.  NVI at L LE.  Assessment/Plan: 3 Days Post-Op Procedure(s) (LRB): TOTAL HIP ARTHROPLASTY ANTERIOR APPROACH (Left) Drain removed and wound dressed.  OK for d/c home from ortho standpoint.  Toni Arthurs 01/24/2013, 10:35 AM

## 2013-01-24 NOTE — Progress Notes (Signed)
Spoke to Dr. Victorino Dike with Orthopaedics regarding leaking hemovac. Dr.Hewitt will come to assess patient.

## 2013-01-24 NOTE — Progress Notes (Signed)
Discharge instructions reviewed with patient and wife.  Dressing changed with instructions on continued cares.  Prescriptions reviewed with patient with teach back on appointments, treatments, etc.

## 2013-02-02 ENCOUNTER — Encounter: Payer: Self-pay | Admitting: Family Medicine

## 2013-02-02 ENCOUNTER — Ambulatory Visit (INDEPENDENT_AMBULATORY_CARE_PROVIDER_SITE_OTHER): Payer: Medicare Other | Admitting: Family Medicine

## 2013-02-02 VITALS — BP 110/60 | HR 60 | Ht 70.0 in | Wt 199.0 lb

## 2013-02-02 DIAGNOSIS — D649 Anemia, unspecified: Secondary | ICD-10-CM

## 2013-02-02 DIAGNOSIS — D62 Acute posthemorrhagic anemia: Secondary | ICD-10-CM

## 2013-02-02 DIAGNOSIS — E785 Hyperlipidemia, unspecified: Secondary | ICD-10-CM

## 2013-02-02 NOTE — Progress Notes (Signed)
  Subjective:    Patient ID: Paul Sandoval., male    DOB: [birth year], 72 y.o.   MRN: [id number]  HPI Patient arrives office with several concerns. He gives a protracted history. He's had 2 separate surgeries in the past number of weeks. He had hip surgery. Overall his recovery is good in this regard. He also developed progression of abdominal aneurysm. This required surgery.  Patient developed an anemia with this. He also had borderline platelets. Known advised him to take iron or vitamin supplements  Patient claims compliance with blood pressure medication. He also notes compliance with his cholesterol medicine. Trying to cut down salt intake.   Review of Systems No chest pain no headache some fatigue no abdominal pain no change in urinary or bowel habits ROS otherwise negative    Objective:   Physical Exam Alert vital signs stable blood pressure borderline low. Repeat 08/30/1968. HEENT normal. Lungs clear. Heart regular in rhythm. Ankles without edema. Postsurgical incision noted healing well.       Assessment & Plan:  Impression #1 hypertension control on the tight side discussed potential medicine adjustment. #2 hyperlipidemia status uncertain. #3 anemia and discuss. Plan diet exercise discussed appropriate blood work. Recheck in several months. Iron gluconate support along with vitamin. Followup on platelets hemoglobin and lipid panel via blood work. WSL

## 2013-02-02 NOTE — Patient Instructions (Addendum)
Iron gluconate tablets twice per day. Also, centrum silver one per day multivitamin  Do bloodwork in three weeks

## 2013-02-10 DIAGNOSIS — D62 Acute posthemorrhagic anemia: Secondary | ICD-10-CM | POA: Insufficient documentation

## 2013-02-11 ENCOUNTER — Telehealth: Payer: Self-pay | Admitting: Family Medicine

## 2013-02-11 NOTE — Telephone Encounter (Signed)
Patient would like to know if Dr. Brett Canales could refill Hydrocodone/ACETA 5/325.  This was given to him by Dr. Ahmed Prima with [facility] after his Hip Replacement.  States he just needs this for a little extra help to get through the day.  Patient is completely out of this medication.  [facility].  Please call patient to advise.  Thanks

## 2013-02-11 NOTE — Telephone Encounter (Signed)
Numb fifty 5/325 one q 6 prn

## 2013-02-11 NOTE — Telephone Encounter (Signed)
See hospital note dated 01/19/13

## 2013-02-11 NOTE — Telephone Encounter (Signed)
RX called in hydrocodone 5/325 Numb fifty 5/325 one q 6 prn to [place]. Patient was notified.

## 2013-02-23 LAB — CBC WITH DIFFERENTIAL/PLATELET
Basophils Absolute: 0 10*3/uL (ref 0.0–0.1)
Eosinophils Absolute: 0.1 10*3/uL (ref 0.0–0.7)
Eosinophils Relative: 2 % (ref 0–5)
MCH: 27.7 pg (ref 26.0–34.0)
MCV: 84 fL (ref 78.0–100.0)
Monocytes Absolute: 0.7 10*3/uL (ref 0.1–1.0)
Platelets: 148 10*3/uL — ABNORMAL LOW (ref 150–400)
RDW: 14.6 % (ref 11.5–15.5)

## 2013-02-23 LAB — LIPID PANEL
Cholesterol: 128 mg/dL (ref 0–200)
HDL: 37 mg/dL — ABNORMAL LOW (ref >39)
Total CHOL/HDL Ratio: 3.5 Ratio
Triglycerides: 128 mg/dL (ref <150)

## 2013-03-02 ENCOUNTER — Telehealth: Payer: Self-pay | Admitting: Family Medicine

## 2013-03-02 NOTE — Telephone Encounter (Signed)
See res note

## 2013-03-02 NOTE — Telephone Encounter (Signed)
Patient called and would like to know the results of his blood work that was completed on February 23, 2013.  Please call patient as soon as possible.  Thanks

## 2013-03-02 NOTE — Telephone Encounter (Signed)
Results discussed with patient. Patient verbalized understanding. 

## 2013-04-29 ENCOUNTER — Telehealth: Payer: Self-pay | Admitting: Family Medicine

## 2013-04-29 NOTE — Telephone Encounter (Signed)
No--we can do hgb in office, let's go ahead and do then (make a nurses note to yourself)

## 2013-04-29 NOTE — Telephone Encounter (Signed)
Notified patient's wife we will check hgb on Monday at office visit. She verbalized understanding.

## 2013-04-29 NOTE — Telephone Encounter (Signed)
Patient is calling to see if he should have BW done before his appt on Monday?

## 2013-05-04 ENCOUNTER — Encounter: Payer: Self-pay | Admitting: Family Medicine

## 2013-05-04 ENCOUNTER — Ambulatory Visit (INDEPENDENT_AMBULATORY_CARE_PROVIDER_SITE_OTHER): Payer: Medicare Other | Admitting: Family Medicine

## 2013-05-04 VITALS — BP 148/80 | Ht 70.0 in | Wt 197.6 lb

## 2013-05-04 DIAGNOSIS — K219 Gastro-esophageal reflux disease without esophagitis: Secondary | ICD-10-CM

## 2013-05-04 DIAGNOSIS — D62 Acute posthemorrhagic anemia: Secondary | ICD-10-CM

## 2013-05-04 DIAGNOSIS — E785 Hyperlipidemia, unspecified: Secondary | ICD-10-CM

## 2013-05-04 DIAGNOSIS — I10 Essential (primary) hypertension: Secondary | ICD-10-CM

## 2013-05-04 DIAGNOSIS — I2510 Atherosclerotic heart disease of native coronary artery without angina pectoris: Secondary | ICD-10-CM

## 2013-05-04 NOTE — Progress Notes (Signed)
Subjective:    Patient ID: Paul Sandoval., male    DOB: 14-Mar-1941, 72 y.o.   MRN: [id number]  HPI Here today for f/u visit.   Had blood work done. Patient not happy that his stools are very black with a twice a day iron.  Patient is exercising regularly.  Patient claims compliance with all of his medications. Patient is trying to watch his diet. He has cut down salt and fats.  Reports reflux overall stable as long as she sticks with medication.  Patient reports minimal wheezing at this time uses an inhaler only episodic Lee.  No concerns.   Results for orders placed in visit on 02/02/13  LIPID PANEL      Result Value Range   Cholesterol 128  0 - 200 mg/dL   Triglycerides 782  <956 mg/dL   HDL 37 (*) >21 mg/dL   Total CHOL/HDL Ratio 3.5     VLDL 26  0 - 40 mg/dL   LDL Cholesterol 65  0 - 99 mg/dL  CBC WITH DIFFERENTIAL      Result Value Range   WBC 9.3  4.0 - 10.5 K/uL   RBC 4.01 (*) 4.22 - 5.81 MIL/uL   Hemoglobin 11.1 (*) 13.0 - 17.0 g/dL   HCT 30.8 (*) 65.7 - 84.6 %   MCV 84.0  78.0 - 100.0 fL   MCH 27.7  26.0 - 34.0 pg   MCHC 32.9  30.0 - 36.0 g/dL   RDW 96.2  95.2 - 84.1 %   Platelets 148 (*) 150 - 400 K/uL   Neutrophils Relative % 66  43 - 77 %   Neutro Abs 6.2  1.7 - 7.7 K/uL   Lymphocytes Relative 25  12 - 46 %   Lymphs Abs 2.3  0.7 - 4.0 K/uL   Monocytes Relative 7  3 - 12 %   Monocytes Absolute 0.7  0.1 - 1.0 K/uL   Eosinophils Relative 2  0 - 5 %   Eosinophils Absolute 0.1  0.0 - 0.7 K/uL   Basophils Relative 0  0 - 1 %   Basophils Absolute 0.0  0.0 - 0.1 K/uL   Smear Review Criteria for review not met     Results for orders placed in visit on 02/02/13  LIPID PANEL      Result Value Range   Cholesterol 128  0 - 200 mg/dL   Triglycerides 324  <401 mg/dL   HDL 37 (*) >02 mg/dL   Total CHOL/HDL Ratio 3.5     VLDL 26  0 - 40 mg/dL   LDL Cholesterol 65  0 - 99 mg/dL  CBC WITH DIFFERENTIAL      Result Value Range   WBC 9.3  4.0 - 10.5 K/uL   RBC  4.01 (*) 4.22 - 5.81 MIL/uL   Hemoglobin 11.1 (*) 13.0 - 17.0 g/dL   HCT 72.5 (*) 36.6 - 44.0 %   MCV 84.0  78.0 - 100.0 fL   MCH 27.7  26.0 - 34.0 pg   MCHC 32.9  30.0 - 36.0 g/dL   RDW 34.7  42.5 - 95.6 %   Platelets 148 (*) 150 - 400 K/uL   Neutrophils Relative % 66  43 - 77 %   Neutro Abs 6.2  1.7 - 7.7 K/uL   Lymphocytes Relative 25  12 - 46 %   Lymphs Abs 2.3  0.7 - 4.0 K/uL   Monocytes Relative 7  3 - 12 %  Monocytes Absolute 0.7  0.1 - 1.0 K/uL   Eosinophils Relative 2  0 - 5 %   Eosinophils Absolute 0.1  0.0 - 0.7 K/uL   Basophils Relative 0  0 - 1 %   Basophils Absolute 0.0  0.0 - 0.1 K/uL   Smear Review Criteria for review not met        Review of Systems No chest pain no back pain no abdominal pain no change in weight. No change in bowel habits no change in urinary habits ROS otherwise negative    Objective:   Physical Exam  Alert no acute distress. HEENT normal. Lungs clear. Heart regular rate and rhythm. Ankles without edema.  Hemoglobin 12.4      Assessment & Plan:  Impression #1 anemia improved discussed with patient #2 hypertension good control. #3 COPD/asthma clinically stable. #4 reflux stable. #5 coronary artery disease asymptomatic plan 25 minutes spent most in discussion decrease iron tablet down to one per day. Exercise encourage. Recheck as scheduled. WSL

## 2013-05-04 NOTE — Patient Instructions (Addendum)
Decrease iron tablet to once per day

## 2013-06-12 ENCOUNTER — Ambulatory Visit (INDEPENDENT_AMBULATORY_CARE_PROVIDER_SITE_OTHER): Payer: Medicare Other | Admitting: Family Medicine

## 2013-06-12 ENCOUNTER — Encounter: Payer: Self-pay | Admitting: Family Medicine

## 2013-06-12 VITALS — BP 118/60 | Temp 98.2°F | Ht 70.0 in | Wt 197.8 lb

## 2013-06-12 DIAGNOSIS — J209 Acute bronchitis, unspecified: Secondary | ICD-10-CM

## 2013-06-12 MED ORDER — AMOXICILLIN-POT CLAVULANATE 875-125 MG PO TABS: 1.0000 | ORAL_TABLET | Freq: Two times a day (BID) | ORAL | Status: AC

## 2013-06-12 NOTE — Progress Notes (Signed)
  Subjective:    Patient ID: Paul Sandoval., male    DOB: 14-Mar-1941, 72 y.o.   MRN: [id number]  Cough This is a new problem. The current episode started in the past 7 days. The problem has been gradually worsening. The cough is productive of sputum. Associated symptoms include headaches, nasal congestion, postnasal drip, rhinorrhea and a sore throat. He has tried OTC cough suppressant (Tylenol) for the symptoms. The treatment provided mild relief.    Might have experienced some wheezing with the cough also. No high fevers. Cough productive of some phlegm.  Review of Systems  HENT: Positive for postnasal drip, rhinorrhea and sore throat.   Respiratory: Positive for cough.   Neurological: Positive for headaches.       Objective:   Physical Exam Alert no major distress. HEENT moderate nasal congestion. Moderate malaise evident. Lungs no wheezes currently bronchial cough during exam heart regular in rhythm.       Assessment & Plan:  Impression #1 acute bronchitis. Plan antibiotics prescribed symptomatic care discussed. Warning signs discussed. Augmentin twice a day 10 days. May use inhaler as necessary. WSL

## 2013-06-22 ENCOUNTER — Encounter: Payer: Medicare Other | Admitting: Family Medicine

## 2013-06-24 ENCOUNTER — Ambulatory Visit (INDEPENDENT_AMBULATORY_CARE_PROVIDER_SITE_OTHER): Payer: Medicare Other | Admitting: Family Medicine

## 2013-06-24 ENCOUNTER — Telehealth: Payer: Self-pay | Admitting: Internal Medicine

## 2013-06-24 ENCOUNTER — Encounter: Payer: Self-pay | Admitting: Family Medicine

## 2013-06-24 VITALS — BP 118/68 | HR 70 | Ht 70.5 in | Wt 200.8 lb

## 2013-06-24 DIAGNOSIS — Z23 Encounter for immunization: Secondary | ICD-10-CM

## 2013-06-24 DIAGNOSIS — Z0000 Encounter for general adult medical examination without abnormal findings: Secondary | ICD-10-CM

## 2013-06-24 DIAGNOSIS — E782 Mixed hyperlipidemia: Secondary | ICD-10-CM

## 2013-06-24 DIAGNOSIS — R5381 Other malaise: Secondary | ICD-10-CM

## 2013-06-24 DIAGNOSIS — Z79899 Other long term (current) drug therapy: Secondary | ICD-10-CM

## 2013-06-24 NOTE — Progress Notes (Signed)
  Subjective:    Patient ID: Paul Sandoval., male    DOB: Jul 05, 1941, 72 y.o.   MRN: [id number]  [facility] No falls in the last month 6 months. Mini cog pass. Hemoccult cards given.  Exercising regularly  Diet is real good five d per wk  Last colonoscopy 2012  Flu shot today   takes med religiously  Review of Systems  Constitutional: Negative for fever, activity change and appetite change.  HENT: Negative for congestion and rhinorrhea.   Eyes: Negative for discharge.  Respiratory: Negative for cough and wheezing.   Cardiovascular: Negative for chest pain.  Gastrointestinal: Negative for vomiting, abdominal pain and blood in stool.  Genitourinary: Negative for frequency and difficulty urinating.  Musculoskeletal: Negative for neck pain.  Skin: Negative for rash.  Allergic/Immunologic: Negative for environmental allergies and food allergies.  Neurological: Negative for weakness and headaches.  Psychiatric/Behavioral: Negative for agitation.       Objective:   Physical Exam  Constitutional: He appears well-developed and well-nourished.  HENT:  Head: Normocephalic and atraumatic.  Right Ear: External ear normal.  Left Ear: External ear normal.  Nose: Nose normal.  Mouth/Throat: Oropharynx is clear and moist.  Eyes: EOM are normal. Pupils are equal, round, and reactive to light.  Neck: Normal range of motion. Neck supple. No thyromegaly present.  Cardiovascular: Normal rate, regular rhythm and normal heart sounds.   No murmur heard. Pulmonary/Chest: Effort normal and breath sounds normal. No respiratory distress. He has no wheezes.  Abdominal: Soft. Bowel sounds are normal. He exhibits no distension and no mass. There is no tenderness.  Genitourinary: Prostate normal and penis normal.  Musculoskeletal: Normal range of motion. He exhibits no edema.  Lymphadenopathy:    He has no cervical adenopathy.  Neurological: He is alert. He exhibits normal muscle  tone.  Skin: Skin is warm and dry. No erythema.  Psychiatric: He has a normal mood and affect. His behavior is normal. Judgment normal.          Assessment & Plan:  Impression #1 well adult exam. #2 renal insufficiency. #3 coronary artery disease. #4 hyperlipidemia #5 hypertension. #6 reflux. #7 abdominal aneurysm stenting repair. Plan diet exercise discussed. Maintain same medications. Appropriate vaccines discussed. Followup as scheduled.

## 2013-06-24 NOTE — Telephone Encounter (Signed)
Pt is not due until 12/2017. There is a letter from [facility], pt should have been nic for 7 year tcs.

## 2013-06-24 NOTE — Telephone Encounter (Signed)
Pt called to check on when his next tcs was and when his last one was done. Last tcs was done 01/15/2011. Please advise if I need to NIC for a 5 year tcs.

## 2013-06-25 LAB — CBC WITH DIFFERENTIAL/PLATELET
Basophils Relative: 0 % (ref 0–1)
Eosinophils Absolute: 0.1 10*3/uL (ref 0.0–0.7)
HCT: 40.2 % (ref 39.0–52.0)
Lymphocytes Relative: 21 % (ref 12–46)
Lymphs Abs: 2.1 10*3/uL (ref 0.7–4.0)
MCH: 28.2 pg (ref 26.0–34.0)
Monocytes Absolute: 0.7 10*3/uL (ref 0.1–1.0)
Neutro Abs: 7 10*3/uL (ref 1.7–7.7)
Neutrophils Relative %: 71 % (ref 43–77)
Platelets: 122 10*3/uL — ABNORMAL LOW (ref 150–400)
RBC: 4.72 MIL/uL (ref 4.22–5.81)
WBC: 9.9 10*3/uL (ref 4.0–10.5)

## 2013-06-25 LAB — HEPATIC FUNCTION PANEL
ALT: 12 U/L (ref 0–53)
Albumin: 3.9 g/dL (ref 3.5–5.2)
Indirect Bilirubin: 0.7 mg/dL (ref 0.0–0.9)
Total Protein: 6.5 g/dL (ref 6.0–8.3)

## 2013-06-25 LAB — LIPID PANEL
LDL Cholesterol: 54 mg/dL (ref 0–99)
Total CHOL/HDL Ratio: 3.6 Ratio

## 2013-06-25 LAB — BASIC METABOLIC PANEL
CO2: 30 mEq/L (ref 19–32)
Calcium: 9.1 mg/dL (ref 8.4–10.5)
Potassium: 4.9 mEq/L (ref 3.5–5.3)
Sodium: 140 mEq/L (ref 135–145)

## 2013-06-25 NOTE — Telephone Encounter (Signed)
Reminder in epic °

## 2013-06-29 ENCOUNTER — Encounter: Payer: Self-pay | Admitting: Family Medicine

## 2013-07-08 ENCOUNTER — Other Ambulatory Visit (INDEPENDENT_AMBULATORY_CARE_PROVIDER_SITE_OTHER): Payer: Medicare Other | Admitting: *Deleted

## 2013-07-08 DIAGNOSIS — Z1211 Encounter for screening for malignant neoplasm of colon: Secondary | ICD-10-CM

## 2013-07-08 DIAGNOSIS — Z0000 Encounter for general adult medical examination without abnormal findings: Secondary | ICD-10-CM

## 2013-07-08 LAB — POC HEMOCCULT BLD/STL (HOME/3-CARD/SCREEN)
Card #2 Fecal Occult Blod, POC: NEGATIVE
Card #3 Fecal Occult Blood, POC: NEGATIVE

## 2013-07-20 ENCOUNTER — Encounter: Payer: Self-pay | Admitting: Vascular Surgery

## 2013-07-21 ENCOUNTER — Ambulatory Visit (INDEPENDENT_AMBULATORY_CARE_PROVIDER_SITE_OTHER): Payer: Medicare Other | Admitting: Vascular Surgery

## 2013-07-21 ENCOUNTER — Encounter: Payer: Self-pay | Admitting: Vascular Surgery

## 2013-07-21 ENCOUNTER — Ambulatory Visit (HOSPITAL_COMMUNITY)
Admission: RE | Admit: 2013-07-21 | Discharge: 2013-07-21 | Disposition: A | Discharge status: Home / Self Care | Payer: Medicare Other | Source: Ambulatory Visit | Attending: Vascular Surgery | Admitting: Vascular Surgery

## 2013-07-21 ENCOUNTER — Ambulatory Visit
Admission: RE | Admit: 2013-07-21 | Discharge: 2013-07-21 | Disposition: A | Discharge status: Home / Self Care | Payer: Medicare Other | Source: Ambulatory Visit | Attending: Vascular Surgery | Admitting: Vascular Surgery

## 2013-07-21 DIAGNOSIS — Z48812 Encounter for surgical aftercare following surgery on the circulatory system: Secondary | ICD-10-CM

## 2013-07-21 DIAGNOSIS — I6529 Occlusion and stenosis of unspecified carotid artery: Secondary | ICD-10-CM

## 2013-07-21 DIAGNOSIS — I714 Abdominal aortic aneurysm, without rupture, unspecified: Secondary | ICD-10-CM

## 2013-07-21 DIAGNOSIS — I7771 Dissection of carotid artery: Secondary | ICD-10-CM | POA: Insufficient documentation

## 2013-07-21 DIAGNOSIS — R0989 Other specified symptoms and signs involving the circulatory and respiratory systems: Secondary | ICD-10-CM | POA: Insufficient documentation

## 2013-07-21 MED ORDER — IOHEXOL 350 MG/ML SOLN
75.0000 mL | Freq: Once | INTRAVENOUS | Status: AC | PRN
  Administered 2013-07-21: 75 mL via INTRAVENOUS

## 2013-07-21 NOTE — Progress Notes (Signed)
Subjective:     Patient ID: Paul Sandoval., male   DOB: 04-01-1941, 72 y.o.   MRN: [id number]  HPI this 72 year old male returns 6 months post endovascular aneurysm stent graft insertion-gore -C3-Excluder. He denies any abdominal or back symptoms. He has had a left hip replacement in the last 6 months by  Paul Sandoval  He is doing well from that standpoint. He denies claudication symptoms. He's had no lateralizing weakness, aphasia, amaurosis fugax, diplopia, blurred vision, or syncope. He takes one aspirin per day.  Past Medical History  Diagnosis Date  . 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 AAA (abdominal aortic aneurysm) 2010    4.4 cm 08/2008;4.44 in 7/10 and 4.65 in 08/2009; 4.8 by CT in 11/2009; 4.3 by ultrasound in 08/2010  . Hypertension   . Hyperlipidemia   . Tobacco abuse, in remission     40 pack year total consumption; discontinued in 1996  . GERD (gastroesophageal reflux disease)   . Right bundle branch block   . Diverticulosis   . Colonic polyp 2002    polypectomy in 2002  . Arthritis   . COPD (chronic obstructive pulmonary disease)   . Pneumonia     few yrs. ago  . CAD (coronary artery disease)   . ED (erectile dysfunction)   . Reflux   . IFG (impaired fasting glucose)   . Chronic rhinitis   . S/P hip replacement     Left, done on 01/21/2013 by Paul Sandoval    History  Substance Use Topics  . Smoking status: Former Smoker -- 1.50 packs/day for 30 years    Types: Cigarettes    Quit date: 01/08/1995  . Smokeless tobacco: Never Used  . Alcohol Use: No    Family History  Problem Relation Age of Onset  . Colon cancer Neg Hx   . Colon polyps Neg Hx   . Heart disease Father   . Arthritis Mother   . Parkinsonism Mother   . Arthritis Sister     Brother with rheumatoid arthritis  . Cancer Father     Lung  . Hypertension Brother     Allergies  Allergen Reactions  . Neomycin     Current outpatient prescriptions:acetaminophen  (TYLENOL) 500 MG tablet, Take 1,000 mg by mouth every 6 (six) hours as needed for pain., Disp: , Rfl: ;  albuterol (PROVENTIL HFA;VENTOLIN HFA) 108 (90 BASE) MCG/ACT inhaler, Inhale 2 puffs into the lungs every 6 (six) hours as needed for wheezing., Disp: , Rfl: ;  aspirin EC 325 MG tablet, Take 81 mg by mouth daily., Disp: , Rfl:  enalapril (VASOTEC) 10 MG tablet, Take 15 mg by mouth 2 (two) times daily. , Disp: , Rfl: ;  Ferrous Sulfate (IRON) 28 MG TABS, Take 28 mg by mouth daily., Disp: , Rfl: ;  hydrochlorothiazide 25 MG tablet, Take 12.5 mg by mouth daily. , Disp: , Rfl: ;  metoprolol (LOPRESSOR) 50 MG tablet, Take 50 mg by mouth 2 (two) times daily. , Disp: , Rfl: ;  Multiple Vitamins-Minerals (CENTRUM SILVER ADULT 50+) TABS, Take by mouth daily., Disp: , Rfl:  omeprazole (PRILOSEC) 20 MG capsule, Take 20 mg by mouth daily.  , Disp: , Rfl: ;  simvastatin (ZOCOR) 80 MG tablet, Take 40 mg by mouth at bedtime., Disp: , Rfl: ;  HYDROcodone-acetaminophen (NORCO/VICODIN) 5-325 MG per tablet, Take 1-2 tablets by mouth every 4 (four) hours as needed for pain., Disp: 30 tablet, Rfl: 0  BP 126/59  Pulse 61  Resp 16  Ht 5\' 10"  (1.778 m)  Wt 197 lb (89.359 kg)  BMI 28.27 kg/m2  Body mass index is 28.27 kg/(m^2).            Review of Systems denies chest pain, dyspnea on exertion, PND, orthopnea, claudication. He did have left hip surgery in the past 6 months is doing well from that standpoint. Denies any symptoms in the complete review of systems    Objective:   Physical Exam BP 126/59  Pulse 61  Resp 16  Ht 5\' 10"  (1.778 m)  Wt 197 lb (89.359 kg)  BMI 28.27 kg/m2  Gen.-alert and oriented x3 in no apparent distress HEENT normal for age Lungs no rhonchi or wheezing Cardiovascular regular rhythm no murmurs carotid pulses 3+ palpable no bruits audible Abdomen soft nontender no palpable masses Musculoskeletal free of  major deformities Skin clear -no rashes Neurologic normal Lower  extremities 3+ femoral and dorsalis pedis pulses palpable bilaterally with no edema  Today I ordered a CT angiogram of the abdomen and pelvis which I reviewed the computer. Stent graft is in excellent position. There is no evidence of any endoleak. Maximum diameter of the aneurysm has not changed approximately 51 mm. I also ordered a carotid duplex exam which I reviewed and interpreted. Patient has none mild/moderate carotid occlusive disease which we will be following. Today's study reveals mild right ICA stenosis and a mild/moderate left ICA stenosis but not exceeding 50%        Assessment:     Doing well 6 months post endovascular stent graft repair of abdominal aortic aneurysm-no evidence of endoleak-sac size stable at 51 mm Asymptomatic bilateral mild carotid occlusive disease    Plan:     Return in 6 months for CT angiogram of abdomen and pelvis for aneurysm followup Following that we will see on annual basis and check carotid duplex exam at each return visit-annually

## 2013-07-21 NOTE — Addendum Note (Signed)
Addended by: Dannielle Karvonen on: 07/21/2013 03:58 PM   Modules accepted: Orders

## 2013-09-07 ENCOUNTER — Ambulatory Visit (INDEPENDENT_AMBULATORY_CARE_PROVIDER_SITE_OTHER): Payer: Medicare Other | Admitting: Family Medicine

## 2013-09-07 ENCOUNTER — Encounter: Payer: Self-pay | Admitting: Family Medicine

## 2013-09-07 VITALS — BP 120/78 | Temp 98.7°F | Ht 70.5 in | Wt 200.6 lb

## 2013-09-07 DIAGNOSIS — J310 Chronic rhinitis: Secondary | ICD-10-CM

## 2013-09-07 DIAGNOSIS — J329 Chronic sinusitis, unspecified: Secondary | ICD-10-CM

## 2013-09-07 MED ORDER — AMOXICILLIN 500 MG PO TABS: ORAL_TABLET | ORAL | Status: AC

## 2013-09-07 NOTE — Progress Notes (Signed)
   Subjective:    Patient ID: Paul Sandoval., male    DOB: Aug 01, 1941, 73 y.o.   MRN: [id number]  Cough This is a new problem. The current episode started in the past 7 days. Associated symptoms include headaches and nasal congestion.    Very sig nasal congetion and dischare  Yellow and gunky, no fever  No vom or diarrhea  Frontal headache and pressure      Review of Systems  Respiratory: Positive for cough.   Neurological: Positive for headaches.   ROS otherwise negative     Objective:   Physical Exam Alert mild malaise. Nasal congestion frontal maxillary tenderness. Pharynx slight erythema neck supple. Lungs clear no wheezes currently heart regular in rhythm.       Assessment & Plan:  Impression 1 rhinosinusitis plan amoxicillin 500 3 times a day 10 days. Symptomatic care discussed. WSL

## 2013-11-26 ENCOUNTER — Ambulatory Visit (INDEPENDENT_AMBULATORY_CARE_PROVIDER_SITE_OTHER): Payer: Medicare Other | Admitting: Family Medicine

## 2013-11-26 ENCOUNTER — Encounter: Payer: Self-pay | Admitting: Family Medicine

## 2013-11-26 VITALS — BP 110/68 | Ht 70.5 in | Wt 203.0 lb

## 2013-11-26 DIAGNOSIS — I10 Essential (primary) hypertension: Secondary | ICD-10-CM

## 2013-11-26 DIAGNOSIS — I2510 Atherosclerotic heart disease of native coronary artery without angina pectoris: Secondary | ICD-10-CM

## 2013-11-26 DIAGNOSIS — K219 Gastro-esophageal reflux disease without esophagitis: Secondary | ICD-10-CM

## 2013-11-26 DIAGNOSIS — E785 Hyperlipidemia, unspecified: Secondary | ICD-10-CM

## 2013-11-26 NOTE — Progress Notes (Signed)
   Subjective:    Patient ID: Paul Sandoval., male    DOB: 1941-08-10, 73 y.o.   MRN: [id number]  Hypertension This is a chronic problem. The current episode started more than 1 year ago. The problem has been gradually improving since onset. The problem is controlled. There are no associated agents to hypertension. There are no known risk factors for coronary artery disease. Treatments tried: enalapril. The current treatment provides significant improvement. There are no compliance problems.   Patient states he has no concerns at this time.   BP averages 135 over 70, even better later in the day  Exercising regularly  s p hip replacement overall doing fairly well with this.  History of coronary artery disease. No chest pain. Asymptomatic. Due to see soon.  Compliant with lipid medication. No obvious side effects. Next  Occasional rare wheezing. Tries not to use an inhaler.  Walking regularly. Overall self-reports good job with diet.    Review of Systems No headache no chest pain no back pain no abdominal pain no change in bowel habits no blood in stool ROS otherwise negative    Objective:   Physical Exam  Alert HEENT normal lungs clear. Vitals reviewed. Heart regular in rhythm. Ankles without edema.      Assessment & Plan:  Impression 1 hypertension good control. #2 coronary artery disease clinically stable. #3 asthma also stable. #4 hyperlipidemia good control. Blood work reviewed. Plan diet exercise discussed. Maintain same meds. Followup in 6 months. WSL

## 2013-11-30 ENCOUNTER — Encounter: Payer: Self-pay | Admitting: Family Medicine

## 2013-11-30 ENCOUNTER — Ambulatory Visit (INDEPENDENT_AMBULATORY_CARE_PROVIDER_SITE_OTHER): Payer: Medicare Other | Admitting: Family Medicine

## 2013-11-30 VITALS — BP 120/78 | Ht 70.5 in | Wt 203.0 lb

## 2013-11-30 DIAGNOSIS — R21 Rash and other nonspecific skin eruption: Secondary | ICD-10-CM

## 2013-11-30 MED ORDER — DOXYCYCLINE HYCLATE 100 MG PO TABS: 100.0000 mg | ORAL_TABLET | Freq: Two times a day (BID) | ORAL | Status: DC

## 2013-11-30 NOTE — Patient Instructions (Signed)
Topical bendadryl ointment or cream

## 2013-11-30 NOTE — Progress Notes (Signed)
   Subjective:    Patient ID: Paul Ewing., male    DOB: 1941/02/27, 73 y.o.   MRN: [id number]  HPI Patient arrives with tick bite to right thigh. Pulled tick off on Sat. Am.   Patient was outside a lot the previous day.  Has to read areas on leg. Slight drainage. Very itchy. Minimal pain.  No rash elsewhere. No headache. No joint pain.  Patient very anxious about the possibility tickborne illness. Got outside a lot Review of Systems No vomiting no diarrhea no abdominal pain no change about habits no rash elsewhere ROS other    Objective:   Physical Exam  Alert hydration good vitals stable. Lungs clear. Heart rare rhythm. Right anterior thigh couple discrete erythematous patches with central bite.      Assessment & Plan:  Impression tick bites. Education attempted. Plan local Benadryl cream. Doxy 100 twice a day 10 days. Symptomatic care discussed. WSL

## 2013-12-31 ENCOUNTER — Encounter: Payer: Self-pay | Admitting: Cardiovascular Disease

## 2013-12-31 ENCOUNTER — Ambulatory Visit (INDEPENDENT_AMBULATORY_CARE_PROVIDER_SITE_OTHER): Payer: Medicare Other | Admitting: Cardiovascular Disease

## 2013-12-31 VITALS — BP 127/51 | HR 71 | Ht 70.0 in | Wt 197.0 lb

## 2013-12-31 DIAGNOSIS — I6529 Occlusion and stenosis of unspecified carotid artery: Secondary | ICD-10-CM

## 2013-12-31 DIAGNOSIS — I714 Abdominal aortic aneurysm, without rupture, unspecified: Secondary | ICD-10-CM

## 2013-12-31 DIAGNOSIS — I25810 Atherosclerosis of coronary artery bypass graft(s) without angina pectoris: Secondary | ICD-10-CM

## 2013-12-31 DIAGNOSIS — I10 Essential (primary) hypertension: Secondary | ICD-10-CM

## 2013-12-31 DIAGNOSIS — E785 Hyperlipidemia, unspecified: Secondary | ICD-10-CM

## 2013-12-31 NOTE — Patient Instructions (Signed)
Your physician wants you to follow-up in: 1 year You will receive a reminder letter in the mail two months in advance. If you don't receive a letter, please call our office to schedule the follow-up appointment.    Your physician recommends that you continue on your current medications as directed. Please refer to the Current Medication list given to you today.     Thank you for choosing [facility] !  

## 2013-12-31 NOTE — Progress Notes (Signed)
Patient ID: Paul Mulkern., male   DOB: 1941/03/26, 73 y.o.   MRN: [id number]      SUBJECTIVE: The patient is a 73 year old male who I am evaluating for the first time. He has a history of coronary artery disease and reportedly underwent CABG in 1996. He also has a history of left internal carotid artery stenosis, hypertension, hyperlipidemia, and abdominal aortic aneurysm status post stent graft repair in April 2014. Carotid Dopplers in November 2014 showed less than 40% stenosis in the right internal carotid artery, and 40-59% stenosis in the left internal carotid artery. I do not find any recent echocardiogram or stress test in the electronic medical records.  He is doing very well. The patient denies any symptoms of chest pain, palpitations, shortness of breath, lightheadedness, dizziness, leg swelling, orthopnea, PND, and syncope. He has walked 2 miles daily 5 days a week since 1996 when he underwent bypass surgery. He mows his own lawn with a push mower. He golfs 3 days a week.  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    Allergies  Allergen Reactions  . Neomycin     Current Outpatient Prescriptions  Medication Sig Dispense Refill  . acetaminophen (TYLENOL) 500 MG tablet Take 1,000 mg by mouth every 6 (six) hours as needed for pain.      Marland Kitchen albuterol (PROVENTIL HFA;VENTOLIN HFA) 108 (90 BASE) MCG/ACT inhaler Inhale 2 puffs into the lungs every 6 (six) hours as needed for wheezing.      Marland Kitchen aspirin EC 81 MG tablet Take 81 mg by mouth daily.      . enalapril (VASOTEC) 10 MG tablet Take 10 mg by mouth 3 (three) times daily.       . Ferrous Sulfate (IRON) 28 MG TABS Take 28 mg by mouth daily.      . hydrochlorothiazide 25 MG tablet Take 12.5 mg by mouth daily.       . metoprolol (LOPRESSOR) 50 MG tablet Take 50 mg by mouth 2 (two) times daily.       . Multiple Vitamins-Minerals (CENTRUM SILVER ADULT 50+) TABS  Take by mouth daily.      Marland Kitchen omeprazole (PRILOSEC) 20 MG capsule Take 20 mg by mouth daily.        . simvastatin (ZOCOR) 80 MG tablet Take 40 mg by mouth at bedtime.       No current facility-administered medications for this visit.    Past Medical History  Diagnosis Date  . 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 AAA (abdominal aortic aneurysm) 2010    4.4 cm 08/2008;4.44 in 7/10 and 4.65 in 08/2009; 4.8 by CT in 11/2009; 4.3 by ultrasound in 08/2010  . Hypertension   . Hyperlipidemia   . Tobacco abuse, in remission     40 pack year total consumption; discontinued in 1996  . GERD (gastroesophageal reflux disease)   . Right bundle branch block   . Diverticulosis   . Colonic polyp 2002    polypectomy in 2002  . Arthritis   . COPD (chronic obstructive pulmonary disease)   . Pneumonia     few yrs. ago  . CAD (coronary artery disease)   . ED (erectile dysfunction)   . Reflux   . IFG (impaired fasting glucose)   . Chronic rhinitis   . S/P hip replacement     Left, done on 01/21/2013 by Dr. Alvan Dame  Past Surgical History  Procedure Laterality Date  . Colonoscopy  2002    polypectomy-patient denies  . Laparoscopic cholecystectomy  12/2009  . 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 Abdominal aortic endovascular stent graft N/A 12/11/2012    Procedure: ABDOMINAL AORTIC ENDOVASCULAR STENT GRAFT;  Surgeon: Mal Misty, MD;  Location: [facility];  Service: Vascular;  Laterality: N/A;  Ultrasound guided; [place]  . Abdominal aortic aneurysm repair    . Total hip arthroplasty Left 01/21/2013    Procedure: TOTAL HIP ARTHROPLASTY ANTERIOR APPROACH;  Surgeon: Mauri Pole, MD;  Location: [facility];  Service: Orthopedics;  Laterality: Left;    History   Social History  . Marital Status: Married    Spouse Name: N/A    Number of Children: 1  . Years of Education: N/A   Occupational History  . Retired     [employer]   Social History Main Topics  .  Smoking status: Former Smoker -- 1.50 packs/day for 30 years    Types: Cigarettes    Quit date: 01/08/1995  . Smokeless tobacco: Never Used  . Alcohol Use: No  . Drug Use: No  . Sexual Activity: Not on file   Other Topics Concern  . Not on file   Social History Narrative  . No narrative on file     Filed Vitals:   12/31/13 1116  BP: 127/51  Pulse: 71  Height: 5\' 10"  (1.778 m)  Weight: 197 lb (89.359 kg)    PHYSICAL EXAM General: NAD Neck: No JVD, no thyromegaly. Lungs: Clear to auscultation bilaterally with normal respiratory effort. CV: Nondisplaced PMI.  Regular rate and rhythm, normal S1/S2, no S3/S4, no murmur. No pretibial or periankle edema.  No carotid bruit.  Normal pedal pulses.  Abdomen: Soft, nontender, no hepatosplenomegaly, no distention.  Neurologic: Alert and oriented x 3.  Psych: Normal affect. Extremities: No clubbing or cyanosis.   ECG: reviewed and available in electronic records. (Normal sinus rhythm, right bundle branch block, left posterior fascicular block, nonspecific inferior T wave abnormality, T wave inversions in V3 through V6. Possible old inferior wall MI. Nonspecific ST segment abnormality)      ASSESSMENT AND PLAN: 1. CAD/CABG: Symptomatically stable and doing very well on current medical therapy which includes aspirin, enalapril, metoprolol, and simvastatin. He exercises regularly and has done so since 1996. 2. AAA s/p stent graft repair (April 2014): This is followed by vascular surgery. 3. Hypertension: Well controlled on current medical therapy which includes enalapril, hydrochlorothiazide, and metoprolol. 4. Hyperlipidemia: Lipids checked at the [place] in [place] on 09/18/13 showed total cholesterol 133, triglycerides 181, HDL 36, LDL 60.8, AST 19, ALT 19. 5. Carotid artery stenosis: Carotid Dopplers from November 2014 results noted above. Follows with vascular surgery.  Dispo: f/u 1 year. Kate Sable, M.D., F.A.C.C.

## 2014-01-01 ENCOUNTER — Encounter: Payer: Self-pay | Admitting: Cardiovascular Disease

## 2014-01-13 ENCOUNTER — Other Ambulatory Visit: Payer: Self-pay | Admitting: Vascular Surgery

## 2014-01-13 LAB — BUN: BUN: 17 mg/dL (ref 6–23)

## 2014-01-13 LAB — CREATININE, SERUM: CREATININE: 1.47 mg/dL — AB (ref 0.50–1.35)

## 2014-01-15 ENCOUNTER — Encounter: Payer: Self-pay | Admitting: Surgery

## 2014-01-19 ENCOUNTER — Ambulatory Visit
Admission: RE | Admit: 2014-01-19 | Discharge: 2014-01-19 | Disposition: A | Discharge status: Home / Self Care | Payer: Medicare Other | Source: Ambulatory Visit | Attending: Vascular Surgery | Admitting: Vascular Surgery

## 2014-01-19 ENCOUNTER — Ambulatory Visit (INDEPENDENT_AMBULATORY_CARE_PROVIDER_SITE_OTHER): Payer: Medicare Other | Admitting: Vascular Surgery

## 2014-01-19 ENCOUNTER — Encounter: Payer: Self-pay | Admitting: Vascular Surgery

## 2014-01-19 VITALS — BP 145/69 | HR 64 | Ht 70.0 in | Wt 198.0 lb

## 2014-01-19 DIAGNOSIS — I714 Abdominal aortic aneurysm, without rupture, unspecified: Secondary | ICD-10-CM

## 2014-01-19 DIAGNOSIS — I6529 Occlusion and stenosis of unspecified carotid artery: Secondary | ICD-10-CM

## 2014-01-19 DIAGNOSIS — Z48812 Encounter for surgical aftercare following surgery on the circulatory system: Secondary | ICD-10-CM

## 2014-01-19 MED ORDER — IOHEXOL 350 MG/ML SOLN
80.0000 mL | Freq: Once | INTRAVENOUS | Status: AC | PRN
  Administered 2014-01-19: 80 mL via INTRAVENOUS

## 2014-01-19 NOTE — Addendum Note (Signed)
Addended by: Mena Goes on: 01/19/2014 01:50 PM   Modules accepted: Orders

## 2014-01-19 NOTE — Progress Notes (Signed)
Subjective:     Patient ID: Paul Ewing., male   DOB: 1941-04-27, 73 y.o.   MRN: [id number]  HPI this 73 year old male returns one year post endovascular stent graft repair of abdominal aortic aneurysm. He has been there well with no specific complaints of abdominal or back discomfort. He ambulates without difficulty and is very active.  Past Medical History  Diagnosis Date  . 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 AAA (abdominal aortic aneurysm) 2010    4.4 cm 08/2008;4.44 in 7/10 and 4.65 in 08/2009; 4.8 by CT in 11/2009; 4.3 by ultrasound in 08/2010  . Hypertension   . Hyperlipidemia   . Tobacco abuse, in remission     40 pack year total consumption; discontinued in 1996  . GERD (gastroesophageal reflux disease)   . Right bundle branch block   . Diverticulosis   . Colonic polyp 2002    polypectomy in 2002  . Arthritis   . COPD (chronic obstructive pulmonary disease)   . Pneumonia     few yrs. ago  . CAD (coronary artery disease)   . ED (erectile dysfunction)   . Reflux   . IFG (impaired fasting glucose)   . Chronic rhinitis   . S/P hip replacement     Left, done on 01/21/2013 by Dr. Alvan Dame    History  Substance Use Topics  . Smoking status: Former Smoker -- 1.50 packs/day for 30 years    Types: Cigarettes    Quit date: 01/08/1995  . Smokeless tobacco: Never Used  . Alcohol Use: No    Family History  Problem Relation Age of Onset  . Colon cancer Neg Hx   . Colon polyps Neg Hx   . Heart disease Father   . Arthritis Mother   . Parkinsonism Mother   . Arthritis Sister     Brother with rheumatoid arthritis  . Cancer Father     Lung  . Hypertension Brother     Allergies  Allergen Reactions  . Neomycin     Current outpatient prescriptions:albuterol (PROVENTIL HFA;VENTOLIN HFA) 108 (90 BASE) MCG/ACT inhaler, Inhale 2 puffs into the lungs every 6 (six) hours as needed for wheezing., Disp: , Rfl: ;  aspirin EC 81 MG tablet, Take 81  mg by mouth daily., Disp: , Rfl: ;  enalapril (VASOTEC) 10 MG tablet, Take 10 mg by mouth 3 (three) times daily. , Disp: , Rfl: ;  Ferrous Sulfate (IRON) 28 MG TABS, Take 28 mg by mouth daily., Disp: , Rfl:  hydrochlorothiazide 25 MG tablet, Take 12.5 mg by mouth daily. , Disp: , Rfl: ;  metoprolol (LOPRESSOR) 50 MG tablet, Take 50 mg by mouth 2 (two) times daily. , Disp: , Rfl: ;  Multiple Vitamins-Minerals (CENTRUM SILVER ADULT 50+) TABS, Take by mouth daily., Disp: , Rfl: ;  omeprazole (PRILOSEC) 20 MG capsule, Take 20 mg by mouth daily.  , Disp: , Rfl: ;  simvastatin (ZOCOR) 80 MG tablet, Take 40 mg by mouth at bedtime., Disp: , Rfl:  acetaminophen (TYLENOL) 500 MG tablet, Take 1,000 mg by mouth every 6 (six) hours as needed for pain., Disp: , Rfl:   BP 145/69  Pulse 64  Ht 5\' 10"  (1.778 m)  Wt 198 lb (89.812 kg)  BMI 28.41 kg/m2  SpO2 100%  Body mass index is 28.41 kg/(m^2).          Review of Systems denies chest pain, dyspnea on exertion, PND, orthopnea, hemoptysis, claudication. Denies lateralizing  weakness, aphasia, amaurosis fugax, diplopia, blurred vision, syncope. Has occasional restless legs while sitting at home but otherwise no specific complaints other than some mild left hip tightness where hip replacement was performed last year. Other systems negative and complete review of systems    Objective:   Physical Exam BP 145/69  Pulse 64  Ht 5\' 10"  (1.778 m)  Wt 198 lb (89.812 kg)  BMI 28.41 kg/m2  SpO2 100%  Gen.-alert and oriented x3 in no apparent distress HEENT normal for age Lungs no rhonchi or wheezing Cardiovascular regular rhythm no murmurs carotid pulses 3+ palpable no bruits audible Abdomen soft nontender no palpable masses Musculoskeletal free of  major deformities Skin clear -no rashes Neurologic normal Lower extremities 3+ femoral and dorsalis pedis pulses palpable bilaterally with no edema  Today I ordered a CT angiogram of the abdomen and pelvis  which I reviewed the computer. The maximum diameter of the aneurysm sac appears to have diminished at 51 mm to 45 mm. There is no evidence of endoleak or migration of the graft.        Assessment:     Doing well one year post insertion endovascular stent graft for abdominal aortic aneurysm-decreased size of sac from 51 mm to 45 mm Mild carotid occlusive disease-asymptomatic    Plan:     Return in one year for duplex scan of carotid arteries an abdominal aortic aneurysm stent graft in the office to look for sac size and endoleak.

## 2014-03-13 ENCOUNTER — Other Ambulatory Visit: Payer: Self-pay | Admitting: Family Medicine

## 2014-03-15 ENCOUNTER — Emergency Department (HOSPITAL_COMMUNITY)
Admission: EM | Admit: 2014-03-15 | Discharge: 2014-03-15 | Disposition: A | Discharge status: Home / Self Care | Payer: Medicare Other | Attending: Emergency Medicine | Admitting: Emergency Medicine

## 2014-03-15 ENCOUNTER — Ambulatory Visit (INDEPENDENT_AMBULATORY_CARE_PROVIDER_SITE_OTHER): Payer: Medicare Other | Admitting: Family Medicine

## 2014-03-15 ENCOUNTER — Emergency Department (HOSPITAL_COMMUNITY): Payer: Medicare Other

## 2014-03-15 ENCOUNTER — Encounter (HOSPITAL_COMMUNITY): Payer: Self-pay | Admitting: Emergency Medicine

## 2014-03-15 ENCOUNTER — Encounter: Payer: Self-pay | Admitting: Family Medicine

## 2014-03-15 VITALS — BP 122/70 | Temp 98.0°F | Ht 70.5 in | Wt 197.1 lb

## 2014-03-15 DIAGNOSIS — Z87891 Personal history of nicotine dependence: Secondary | ICD-10-CM | POA: Diagnosis not present

## 2014-03-15 DIAGNOSIS — Z951 Presence of aortocoronary bypass graft: Secondary | ICD-10-CM | POA: Diagnosis not present

## 2014-03-15 DIAGNOSIS — J441 Chronic obstructive pulmonary disease with (acute) exacerbation: Secondary | ICD-10-CM | POA: Diagnosis not present

## 2014-03-15 DIAGNOSIS — K219 Gastro-esophageal reflux disease without esophagitis: Secondary | ICD-10-CM | POA: Diagnosis not present

## 2014-03-15 DIAGNOSIS — I200 Unstable angina: Secondary | ICD-10-CM

## 2014-03-15 DIAGNOSIS — Z87448 Personal history of other diseases of urinary system: Secondary | ICD-10-CM | POA: Diagnosis not present

## 2014-03-15 DIAGNOSIS — E785 Hyperlipidemia, unspecified: Secondary | ICD-10-CM | POA: Insufficient documentation

## 2014-03-15 DIAGNOSIS — I10 Essential (primary) hypertension: Secondary | ICD-10-CM | POA: Insufficient documentation

## 2014-03-15 DIAGNOSIS — Z7982 Long term (current) use of aspirin: Secondary | ICD-10-CM | POA: Insufficient documentation

## 2014-03-15 DIAGNOSIS — I2510 Atherosclerotic heart disease of native coronary artery without angina pectoris: Secondary | ICD-10-CM | POA: Diagnosis not present

## 2014-03-15 DIAGNOSIS — Z860100 Personal history of colon polyps, unspecified: Secondary | ICD-10-CM | POA: Insufficient documentation

## 2014-03-15 DIAGNOSIS — Z79899 Other long term (current) drug therapy: Secondary | ICD-10-CM | POA: Insufficient documentation

## 2014-03-15 DIAGNOSIS — M129 Arthropathy, unspecified: Secondary | ICD-10-CM | POA: Diagnosis not present

## 2014-03-15 DIAGNOSIS — Z9889 Other specified postprocedural states: Secondary | ICD-10-CM | POA: Insufficient documentation

## 2014-03-15 DIAGNOSIS — I252 Old myocardial infarction: Secondary | ICD-10-CM | POA: Insufficient documentation

## 2014-03-15 DIAGNOSIS — R0789 Other chest pain: Secondary | ICD-10-CM | POA: Diagnosis present

## 2014-03-15 DIAGNOSIS — Z8701 Personal history of pneumonia (recurrent): Secondary | ICD-10-CM | POA: Insufficient documentation

## 2014-03-15 DIAGNOSIS — R079 Chest pain, unspecified: Secondary | ICD-10-CM

## 2014-03-15 LAB — CBC WITH DIFFERENTIAL/PLATELET
Basophils Absolute: 0 10*3/uL (ref 0.0–0.1)
Basophils Relative: 0 % (ref 0–1)
Eosinophils Absolute: 0.1 10*3/uL (ref 0.0–0.7)
Eosinophils Relative: 0 % (ref 0–5)
HCT: 42.2 % (ref 39.0–52.0)
HEMOGLOBIN: 14.2 g/dL (ref 13.0–17.0)
Lymphocytes Relative: 15 % (ref 12–46)
Lymphs Abs: 1.9 10*3/uL (ref 0.7–4.0)
MCH: 30.2 pg (ref 26.0–34.0)
MCHC: 33.6 g/dL (ref 30.0–36.0)
MCV: 89.8 fL (ref 78.0–100.0)
MONOS PCT: 8 % (ref 3–12)
Monocytes Absolute: 1 10*3/uL (ref 0.1–1.0)
Neutro Abs: 9.1 10*3/uL — ABNORMAL HIGH (ref 1.7–7.7)
Neutrophils Relative %: 77 % (ref 43–77)
PLATELETS: 122 10*3/uL — AB (ref 150–400)
RBC: 4.7 MIL/uL (ref 4.22–5.81)
RDW: 13.2 % (ref 11.5–15.5)
WBC: 12 10*3/uL — ABNORMAL HIGH (ref 4.0–10.5)

## 2014-03-15 LAB — COMPREHENSIVE METABOLIC PANEL
ALK PHOS: 86 U/L (ref 39–117)
ALT: 16 U/L (ref 0–53)
ANION GAP: 11 (ref 5–15)
AST: 21 U/L (ref 0–37)
Albumin: 4 g/dL (ref 3.5–5.2)
BUN: 20 mg/dL (ref 6–23)
CO2: 30 mEq/L (ref 19–32)
Calcium: 9.4 mg/dL (ref 8.4–10.5)
Chloride: 97 mEq/L (ref 96–112)
Creatinine, Ser: 1.63 mg/dL — ABNORMAL HIGH (ref 0.50–1.35)
GFR calc Af Amer: 46 mL/min — ABNORMAL LOW (ref >90)
GFR calc non Af Amer: 40 mL/min — ABNORMAL LOW (ref >90)
Glucose, Bld: 95 mg/dL (ref 70–99)
Potassium: 5 mEq/L (ref 3.7–5.3)
Sodium: 138 mEq/L (ref 137–147)
TOTAL PROTEIN: 7.8 g/dL (ref 6.0–8.3)
Total Bilirubin: 0.7 mg/dL (ref 0.3–1.2)

## 2014-03-15 LAB — TROPONIN I: Troponin I: <0.3 ng/mL (ref <0.30)

## 2014-03-15 MED ORDER — PREDNISONE 10 MG PO TABS: 20.0000 mg | ORAL_TABLET | Freq: Every day | ORAL | Status: DC

## 2014-03-15 MED ORDER — METHYLPREDNISOLONE SODIUM SUCC 125 MG IJ SOLR
125.0000 mg | Freq: Once | INTRAMUSCULAR | Status: AC
  Administered 2014-03-15: 125 mg via INTRAVENOUS
  Filled 2014-03-15: qty 2

## 2014-03-15 MED ORDER — ALBUTEROL SULFATE (2.5 MG/3ML) 0.083% IN NEBU
2.5000 mg | INHALATION_SOLUTION | RESPIRATORY_TRACT | Status: DC | PRN
  Administered 2014-03-15: 2.5 mg via RESPIRATORY_TRACT
  Filled 2014-03-15: qty 3

## 2014-03-15 NOTE — Discharge Instructions (Signed)
You have an appointment for Wednesday, July 22 at 8:35 AM with Dr. Johnsie Cancel At [facility], [place]. Do not exert yourself. Use your inhaler every hour hours. Take the prednisone as prescribed. Return to emergency room if you have any symptoms at rest or simply up and around at home.  Chronic Obstructive Pulmonary Disease Exacerbation  Chronic obstructive pulmonary disease (COPD) is a common lung problem. In COPD, the flow of air from the lungs is limited. COPD exacerbations are times that breathing gets worse and you need extra treatment. Without treatment they can be life threatening. If they happen often, your lungs can become more damaged. HOME CARE  Do not smoke.  Avoid tobacco smoke and other things that bother your lungs.  If given, take your antibiotic medicine as told. Finish the medicine even if you start to feel better.  Only take medicines as told by your doctor.  Drink enough fluids to keep your pee (urine) clear or pale yellow (unless your doctor has told you not to).  Use a cool mist machine (vaporizer).  If you use oxygen or a machine that turns liquid medicine into a mist (nebulizer), continue to use them as told.  Keep up with shots (vaccinations) as told by your doctor.  Exercise regularly.  Eat healthy foods.  Keep all doctor visits as told. GET HELP RIGHT AWAY IF:  You are very short of breath and it gets worse.  You have trouble talking.  You have bad chest pain.  You have blood in your spit (sputum).  You have a fever.  You keep throwing up (vomiting).  You feel weak, or you pass out (faint).  You feel confused.  You keep getting worse. MAKE SURE YOU:   Understand these instructions.  Will watch your condition.  Will get help right away if you are not doing well or get worse. Document Released: 08/02/2011 Document Revised: 06/03/2013 Document Reviewed: 04/17/2013 [facility] Patient Information 2015 [place], [place]. This information  is not intended to replace advice given to you by your health care provider. Make sure you discuss any questions you have with your health care provider.

## 2014-03-15 NOTE — ED Provider Notes (Signed)
CSN: [id number]     Arrival date & time 03/15/14  1027 History  This chart was scribed for Tanna Furry, MD by Peyton Bottoms, ED Scribe. This patient was seen in room APA10/APA10 and the patient's care was started at 12:22 PM.    Chief Complaint  Patient presents with  . Chest Pain   The history is provided by the patient. No language interpreter was used.   HPI Comments: Paul Sandoval is a 73 y.o. male who presents to the Emergency Department complaining of intermittent wheezing for the past 4-5 days. He reports associate chest tightness. Patient states he walks 2 miles/day, 5 days/week.  Patient states it has become difficult to walk for the past 5 days, and experiences wheezing and chest tightness with increased physical activity. Patient had CABG surgery in 1996, and has had no issues since surgery.  Patient is a former smoker who quit smoking in 1996. Patient uses an inhaler during flare up episodes, but not on a daily basis.  Patient denies edema in extremities. Patient does not currently feel SOB or tightness.  Symptoms increase with walking around and after playing golf.  Patient has prior history of aortic stent at [facility] in April 2014.  Past Medical History  Diagnosis Date  . 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 AAA (abdominal aortic aneurysm) 2010    4.4 cm 08/2008;4.44 in 7/10 and 4.65 in 08/2009; 4.8 by CT in 11/2009; 4.3 by ultrasound in 08/2010  . Hypertension   . Hyperlipidemia   . Tobacco abuse, in remission     40 pack year total consumption; discontinued in 1996  . GERD (gastroesophageal reflux disease)   . Right bundle branch block   . Diverticulosis   . Colonic polyp 2002    polypectomy in 2002  . Arthritis   . COPD (chronic obstructive pulmonary disease)   . Pneumonia     few yrs. ago  . CAD (coronary artery disease)   . ED (erectile dysfunction)   . Reflux   . IFG (impaired fasting glucose)   . Chronic rhinitis   . S/P hip  replacement     Left, done on 01/21/2013 by Dr. Alvan Dame   Past Surgical History  Procedure Laterality Date  . Colonoscopy  2002    polypectomy-patient denies  . Laparoscopic cholecystectomy  12/2009  . 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 Abdominal aortic endovascular stent graft N/A 12/11/2012    Procedure: ABDOMINAL AORTIC ENDOVASCULAR STENT GRAFT;  Surgeon: Mal Misty, MD;  Location: [place];  Service: Vascular;  Laterality: N/A;  Ultrasound guided; [place]  . Abdominal aortic aneurysm repair    . Total hip arthroplasty Left 01/21/2013    Procedure: TOTAL HIP ARTHROPLASTY ANTERIOR APPROACH;  Surgeon: Mauri Pole, MD;  Location: [place];  Service: Orthopedics;  Laterality: Left;  . Joint replacement     Family History  Problem Relation Age of Onset  . Colon cancer Neg Hx   . Colon polyps Neg Hx   . Heart disease Father   . Arthritis Mother   . Parkinsonism Mother   . Arthritis Sister     Brother with rheumatoid arthritis  . Cancer Father     Lung  . Hypertension Brother    History  Substance Use Topics  . Smoking status: Former Smoker -- 1.50 packs/day for 30 years    Types: Cigarettes    Quit date: 01/08/1995  . Smokeless tobacco: Never Used  .  Alcohol Use: No    Review of Systems  Constitutional: Negative for chills and appetite change.  HENT: Negative for mouth sores and sore throat.   Eyes: Negative for visual disturbance.  Respiratory: Positive for chest tightness and wheezing.   Cardiovascular: Negative for leg swelling.  Gastrointestinal: Negative for diarrhea and abdominal distention.  Endocrine: Negative for polydipsia, polyphagia and polyuria.  Genitourinary: Negative for dysuria, frequency and hematuria.  Musculoskeletal: Negative for gait problem.  Skin: Negative for color change, pallor and rash.  Neurological: Negative for syncope and light-headedness.  Hematological: Does not bruise/bleed easily.  Psychiatric/Behavioral: Negative for behavioral  problems and confusion.      Allergies  Neomycin  Home Medications   Prior to Admission medications   Medication Sig Start Date End Date Taking? Authorizing Provider  acetaminophen (TYLENOL) 500 MG tablet Take 1,000 mg by mouth every 6 (six) hours as needed for pain.   Yes Historical Provider, MD  albuterol (PROVENTIL HFA;VENTOLIN HFA) 108 (90 BASE) MCG/ACT inhaler Inhale 2 puffs into the lungs every 6 (six) hours as needed for wheezing.   Yes Historical Provider, MD  aspirin EC 81 MG tablet Take 81 mg by mouth daily.   Yes Historical Provider, MD  enalapril (VASOTEC) 10 MG tablet Take 10 mg by mouth 3 (three) times daily.    Yes Historical Provider, MD  Ferrous Sulfate (IRON) 28 MG TABS Take 28 mg by mouth daily.   Yes Historical Provider, MD  hydrochlorothiazide 25 MG tablet Take 12.5 mg by mouth daily.    Yes Historical Provider, MD  metoprolol (LOPRESSOR) 50 MG tablet Take 50 mg by mouth 2 (two) times daily.    Yes Historical Provider, MD  Multiple Vitamins-Minerals (CENTRUM SILVER ADULT 50+) TABS Take 1 tablet by mouth daily.    Yes Historical Provider, MD  omeprazole (PRILOSEC) 20 MG capsule Take 20 mg by mouth daily.     Yes Historical Provider, MD  simvastatin (ZOCOR) 80 MG tablet Take 40 mg by mouth at bedtime.   Yes Historical Provider, MD  predniSONE (DELTASONE) 10 MG tablet Take 2 tablets (20 mg total) by mouth daily. 03/15/14   Tanna Furry, MD   Triage Vitals: BP 111/78  Pulse 62  Temp(Src) 98.1 F (36.7 C) (Oral)  Resp 16  Ht 5\' 10"  (1.778 m)  Wt 196 lb (88.905 kg)  BMI 28.12 kg/m2  SpO2 98% Physical Exam  Nursing note and vitals reviewed. Constitutional: He is oriented to person, place, and time. He appears well-developed and well-nourished. No distress.  HENT:  Head: Normocephalic.  Eyes: Conjunctivae are normal. Pupils are equal, round, and reactive to light. No scleral icterus.  Neck: Normal range of motion. Neck supple. No thyromegaly present.  Cardiovascular:  Normal rate and regular rhythm.  Exam reveals no gallop and no friction rub.   No murmur heard. Pulmonary/Chest: Effort normal and breath sounds normal. No respiratory distress. He has no wheezes. He has no rales.  Inspiratory and Expiratory Wheezing in all fields  Abdominal: Soft. Bowel sounds are normal. He exhibits no distension. There is no tenderness. There is no rebound.  Musculoskeletal: Normal range of motion.  Neurological: He is alert and oriented to person, place, and time.  Skin: Skin is warm and dry. No rash noted.  Psychiatric: He has a normal mood and affect. His behavior is normal.    ED Course  Procedures (including critical care time)  DIAGNOSTIC STUDIES: Oxygen Saturation is 98% on RA, Normal by my interpretation.  COORDINATION OF CARE: 2:25 PM- Discussed plans to perform stress test and provide breathing treatment.  Discussed plans to obtain lab tests. Pt advised of plan for treatment and pt agrees.    Labs Review Labs Reviewed  CBC WITH DIFFERENTIAL - Abnormal; Notable for the following:    WBC 12.0 (*)    Platelets 122 (*)    Neutro Abs 9.1 (*)    All other components within normal limits  COMPREHENSIVE METABOLIC PANEL - Abnormal; Notable for the following:    Creatinine, Ser 1.63 (*)    GFR calc non Af Amer 40 (*)    GFR calc Af Amer 46 (*)    All other components within normal limits  TROPONIN I  TROPONIN I    Imaging Review Dg Chest 2 View  03/15/2014   CLINICAL DATA:  Chest pressure for 6 days.  Weakness.  EXAM: CHEST  2 VIEW  COMPARISON:  01/19/2013  FINDINGS: Mild hyperinflation. Prior median sternotomy. Moderate thoracic spondylosis. Midline trachea. Mild cardiomegaly. Atherosclerosis in the transverse aorta. Mediastinal contours otherwise within normal limits. No pleural effusion or pneumothorax. Mild biapical pleural thickening. No congestive failure. Clear lungs.  IMPRESSION: Mild cardiomegaly and aortic atherosclerosis.  No acute findings.    Electronically Signed   By: Abigail Miyamoto M.D.   On: 03/15/2014 11:24     EKG Interpretation None      MDM   Final diagnoses:  COPD exacerbation     Patient has no symptoms at rest or with daily activities. Only with strong exertion such as his 2 mile walk. The symptoms are reproduced with activity and relieved with his inhaler. He is wheezing here. Resolves after nebulized albuterol. He states he feels "like 1 million bucks".    I discussed the case with Dr. Harrington Challenger at the lower part of the clinic here in refill. I described him as a patient I felt was a COPD exacerbation. However, was requesting close followup. He will be seen on Wednesday morning at 835. He was instructed to not exert himself. Recheck here with any symptoms at worse or with ADLs.  I personally performed the services described in this documentation, which was scribed in my presence. The recorded information has been reviewed and is accurate.      Tanna Furry, MD 03/15/14 1426

## 2014-03-15 NOTE — Progress Notes (Signed)
   Subjective:    Patient ID: Paul Sandoval., male    DOB: [birth year], 73 y.o.   MRN: [id number]  Chest Pain  This is a new problem. The current episode started in the past 7 days. The onset quality is gradual. The problem occurs intermittently. The problem has been unchanged. The pain is at a severity of 0/10. The patient is experiencing no pain. The quality of the pain is described as pressure and tightness. The pain radiates to the mid back. Associated symptoms include malaise/fatigue.   Patient states he has no other concerns at this time.   Pt walks regularly, could not finish his reg two miles.  Felt tight in the chest. Comes and goes. Watching tv last night sand stayed with it thru the night. Got up and down thru the night with it.  Slight nausea, feels sob with it.  Started kicking in after a mile into his walk, and then had to quit early or rest.  Pt considered heart doc visit.  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  Cardiovascular: Positive for chest pain.       Objective:   Physical Exam Alert no acute distress vital stable. Lungs clear heart rare rhythm. Mild epigastric tenderness. No chest wall tenderness.  EKG right bundle branch block. Nonspecific ST-T changes.       Assessment & Plan:  Impression chest pain worrisome for unstable angina via description. Discussed with patient. Known coronary artery disease. Plan urgent referral to emergency room. I spoke with ER Dr. Given 325 mg aspirin to chew up. WSL

## 2014-03-15 NOTE — ED Notes (Signed)
PT c/o tightness in center of chest that started around 5 days ago. PT was seen and sent to ER from Dr. Wolfgang Phoenix primary doctor office this am.

## 2014-03-17 ENCOUNTER — Ambulatory Visit (INDEPENDENT_AMBULATORY_CARE_PROVIDER_SITE_OTHER): Payer: Medicare Other | Admitting: Cardiovascular Disease

## 2014-03-17 ENCOUNTER — Encounter: Payer: Self-pay | Admitting: *Deleted

## 2014-03-17 ENCOUNTER — Encounter: Payer: Self-pay | Admitting: Cardiovascular Disease

## 2014-03-17 ENCOUNTER — Ambulatory Visit (HOSPITAL_COMMUNITY)
Admission: RE | Admit: 2014-03-17 | Discharge: 2014-03-17 | Disposition: A | Discharge status: Home / Self Care | Payer: Medicare Other | Source: Ambulatory Visit | Attending: Cardiovascular Disease | Admitting: Cardiovascular Disease

## 2014-03-17 VITALS — BP 142/68 | HR 62 | Ht 70.0 in | Wt 201.0 lb

## 2014-03-17 DIAGNOSIS — I2584 Coronary atherosclerosis due to calcified coronary lesion: Secondary | ICD-10-CM | POA: Diagnosis not present

## 2014-03-17 DIAGNOSIS — I200 Unstable angina: Secondary | ICD-10-CM

## 2014-03-17 DIAGNOSIS — R931 Abnormal findings on diagnostic imaging of heart and coronary circulation: Secondary | ICD-10-CM

## 2014-03-17 DIAGNOSIS — I2582 Chronic total occlusion of coronary artery: Secondary | ICD-10-CM | POA: Diagnosis not present

## 2014-03-17 DIAGNOSIS — I209 Angina pectoris, unspecified: Secondary | ICD-10-CM | POA: Diagnosis not present

## 2014-03-17 DIAGNOSIS — I7771 Dissection of carotid artery: Secondary | ICD-10-CM

## 2014-03-17 DIAGNOSIS — I714 Abdominal aortic aneurysm, without rupture, unspecified: Secondary | ICD-10-CM

## 2014-03-17 DIAGNOSIS — R9439 Abnormal result of other cardiovascular function study: Secondary | ICD-10-CM

## 2014-03-17 DIAGNOSIS — I2510 Atherosclerotic heart disease of native coronary artery without angina pectoris: Secondary | ICD-10-CM | POA: Diagnosis not present

## 2014-03-17 DIAGNOSIS — E785 Hyperlipidemia, unspecified: Secondary | ICD-10-CM | POA: Diagnosis not present

## 2014-03-17 DIAGNOSIS — I25119 Atherosclerotic heart disease of native coronary artery with unspecified angina pectoris: Secondary | ICD-10-CM

## 2014-03-17 DIAGNOSIS — N189 Chronic kidney disease, unspecified: Secondary | ICD-10-CM | POA: Diagnosis not present

## 2014-03-17 DIAGNOSIS — I129 Hypertensive chronic kidney disease with stage 1 through stage 4 chronic kidney disease, or unspecified chronic kidney disease: Secondary | ICD-10-CM | POA: Diagnosis not present

## 2014-03-17 DIAGNOSIS — Z7982 Long term (current) use of aspirin: Secondary | ICD-10-CM | POA: Diagnosis not present

## 2014-03-17 DIAGNOSIS — J449 Chronic obstructive pulmonary disease, unspecified: Secondary | ICD-10-CM | POA: Diagnosis not present

## 2014-03-17 DIAGNOSIS — Z87891 Personal history of nicotine dependence: Secondary | ICD-10-CM | POA: Diagnosis not present

## 2014-03-17 DIAGNOSIS — R079 Chest pain, unspecified: Secondary | ICD-10-CM | POA: Diagnosis present

## 2014-03-17 DIAGNOSIS — I369 Nonrheumatic tricuspid valve disorder, unspecified: Secondary | ICD-10-CM

## 2014-03-17 DIAGNOSIS — I25810 Atherosclerosis of coronary artery bypass graft(s) without angina pectoris: Secondary | ICD-10-CM | POA: Diagnosis not present

## 2014-03-17 MED ORDER — NITROGLYCERIN 0.4 MG SL SUBL: 0.4000 mg | SUBLINGUAL_TABLET | SUBLINGUAL | Status: DC | PRN

## 2014-03-17 NOTE — Progress Notes (Signed)
*  PRELIMINARY RESULTS* Echocardiogram 2D Echocardiogram has been performed.  Leavy Cella 03/17/2014, 12:11 PM

## 2014-03-17 NOTE — Assessment & Plan Note (Signed)
Well controlled.  Continue current medications and low sodium Dash type diet.    

## 2014-03-17 NOTE — Assessment & Plan Note (Signed)
73 yo bypass grafts with likely unstable anginal equivalent  SL nitro called in  Will arrange right and left cath with LV and grafts with DR Burt Knack tomorrow Risks discussed patient willing to proceed.  Labs and  CXR done 7/20  CR 1.6  Will have him hold his HCTZ and het NS for a couple of hours prior to cath

## 2014-03-17 NOTE — Assessment & Plan Note (Signed)
F/u Dr Kellie Simmering Due for [place] / CTA surveillance no palpable mass

## 2014-03-17 NOTE — Progress Notes (Signed)
Patient ID: Paul Sandoval, male   DOB: [birth year], 73 y.o.   MRN: [id number]    73 yo previously seen by DR Myrtie Neither 5/15 .  Distant history of CABG in 1996 History of carotid disease and aortic stent graft followed by Dr Kellie Simmering.  Very active  Has walked 2 miles/dayfor years and mows his lawn.  Has had chest pressure and dyspnea for about 8 days.  Exertional.  Does not go away quickly.  No rest pain.  Seen in ER 7/20 Some relief with albuterol  Has some asthma/COPD but quit smoking in 96  No cough sputum or fever  CXR in ER NAD.  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  No recent evaluation of EF  Compliant with meds and ASA.       ROS: Denies fever, malais, weight loss, blurry vision, decreased visual acuity, cough, sputum, SOB, hemoptysis, pleuritic pain, palpitaitons, heartburn, abdominal pain, melena, lower extremity edema, claudication, or rash.  All other systems reviewed and negative   General: Affect appropriate Healthy:  appears stated age 75: normal Neck supple with no adenopathy JVP normal left  bruits no thyromegaly Lungs clear with no wheezing and good diaphragmatic motion Heart:  S1/S2 no murmur,rub, gallop or click PMI normal Abdomen: benighn, BS positve, no tenderness, no AAA no bruit.  No HSM or HJR Distal pulses intact with no bruits No edema Neuro non-focal Skin warm and dry No muscular weakness  Medications Current Outpatient Prescriptions  Medication Sig Dispense Refill  . acetaminophen (TYLENOL) 500 MG tablet Take 1,000 mg by mouth every 6 (six) hours as needed for pain.      Marland Kitchen aspirin EC 81 MG tablet Take 81 mg by mouth daily.      . enalapril (VASOTEC) 10 MG tablet Take 10 mg by mouth 3 (three) times daily.       . Ferrous Sulfate (IRON) 28 MG TABS Take 28 mg by mouth daily.      . hydrochlorothiazide 25 MG tablet Take 12.5 mg by mouth daily.       . metoprolol  (LOPRESSOR) 50 MG tablet Take 50 mg by mouth 2 (two) times daily.       . Multiple Vitamins-Minerals (CENTRUM SILVER ADULT 50+) TABS Take 1 tablet by mouth daily.       Marland Kitchen omeprazole (PRILOSEC) 20 MG capsule Take 20 mg by mouth daily.        . predniSONE (DELTASONE) 10 MG tablet Take 2 tablets (20 mg total) by mouth daily.  10 tablet  0  . simvastatin (ZOCOR) 80 MG tablet Take 40 mg by mouth at bedtime.      . VENTOLIN HFA 108 (90 BASE) MCG/ACT inhaler INHALE 2 PUFFS BY MOUTH EVERY 4 TO 6 HOURS AS NEEDED FOR WHEEZING.  18 g  5   No current facility-administered medications for this visit.    Allergies Neomycin  Family History: Family History  Problem Relation Age of Onset  . Colon cancer Neg Hx   . Colon polyps Neg Hx   . Heart disease Father   . Arthritis Mother   . Parkinsonism Mother   . Arthritis Sister     Brother with rheumatoid arthritis  . Cancer Father     Lung  . Hypertension Brother     Social History: History   Social History  . Marital Status: Married    Spouse Name: N/A  Number of Children: 1  . Years of Education: N/A   Occupational History  . Retired     [employer]   Social History Main Topics  . Smoking status: Former Smoker -- 1.50 packs/day for 30 years    Types: Cigarettes    Quit date: 01/08/1995  . Smokeless tobacco: Never Used  . Alcohol Use: No  . Drug Use: No  . Sexual Activity: Not on file   Other Topics Concern  . Not on file   Social History Narrative  . No narrative on file    Electrocardiogram:  SR RBBB anterolateral MI  No change from 2014  Assessment and Plan

## 2014-03-17 NOTE — Patient Instructions (Signed)
Your physician recommends that you schedule a follow-up appointment in: post procedure  Your physician has requested that you have a cardiac catheterization. Cardiac catheterization is used to diagnose and/or treat various heart conditions. Doctors may recommend this procedure for a number of different reasons. The most common reason is to evaluate chest pain. Chest pain can be a symptom of coronary artery disease (CAD), and cardiac catheterization can show whether plaque is narrowing or blocking your heart's arteries. This procedure is also used to evaluate the valves, as well as measure the blood flow and oxygen levels in different parts of your heart. For further information please visit [email or web address]. Please follow instruction sheet, as given.

## 2014-03-17 NOTE — Assessment & Plan Note (Signed)
Moderate left ICA  F/u VVS for duplex ASA

## 2014-03-17 NOTE — Assessment & Plan Note (Signed)
Cholesterol is at goal.  Continue current dose of statin and diet Rx.  No myalgias or side effects.  F/U  LFT's in 6 months. Lab Results  Component Value Date   LDLCALC 54 06/24/2013

## 2014-03-18 ENCOUNTER — Ambulatory Visit (HOSPITAL_COMMUNITY)
Admission: RE | Admit: 2014-03-18 | Discharge: 2014-03-19 | Disposition: A | Discharge status: Home / Self Care | Payer: Medicare Other | Source: Ambulatory Visit | Attending: Cardiovascular Disease | Admitting: Cardiovascular Disease

## 2014-03-18 ENCOUNTER — Encounter (HOSPITAL_COMMUNITY): Payer: Self-pay | Admitting: General Practice

## 2014-03-18 ENCOUNTER — Encounter (HOSPITAL_COMMUNITY)
Admission: RE | Disposition: A | Discharge status: Home / Self Care | Payer: Self-pay | Source: Ambulatory Visit | Attending: Cardiovascular Disease

## 2014-03-18 ENCOUNTER — Other Ambulatory Visit: Payer: Self-pay | Admitting: Cardiovascular Disease

## 2014-03-18 DIAGNOSIS — I25810 Atherosclerosis of coronary artery bypass graft(s) without angina pectoris: Secondary | ICD-10-CM | POA: Insufficient documentation

## 2014-03-18 DIAGNOSIS — I10 Essential (primary) hypertension: Secondary | ICD-10-CM | POA: Diagnosis present

## 2014-03-18 DIAGNOSIS — I25701 Atherosclerosis of coronary artery bypass graft(s), unspecified, with angina pectoris with documented spasm: Secondary | ICD-10-CM

## 2014-03-18 DIAGNOSIS — Z87891 Personal history of nicotine dependence: Secondary | ICD-10-CM | POA: Diagnosis not present

## 2014-03-18 DIAGNOSIS — I209 Angina pectoris, unspecified: Secondary | ICD-10-CM | POA: Diagnosis present

## 2014-03-18 DIAGNOSIS — J449 Chronic obstructive pulmonary disease, unspecified: Secondary | ICD-10-CM | POA: Diagnosis not present

## 2014-03-18 DIAGNOSIS — I2584 Coronary atherosclerosis due to calcified coronary lesion: Secondary | ICD-10-CM | POA: Insufficient documentation

## 2014-03-18 DIAGNOSIS — I2589 Other forms of chronic ischemic heart disease: Secondary | ICD-10-CM | POA: Diagnosis not present

## 2014-03-18 DIAGNOSIS — J4489 Other specified chronic obstructive pulmonary disease: Secondary | ICD-10-CM | POA: Insufficient documentation

## 2014-03-18 DIAGNOSIS — N189 Chronic kidney disease, unspecified: Secondary | ICD-10-CM | POA: Insufficient documentation

## 2014-03-18 DIAGNOSIS — I2510 Atherosclerotic heart disease of native coronary artery without angina pectoris: Secondary | ICD-10-CM | POA: Diagnosis present

## 2014-03-18 DIAGNOSIS — I2582 Chronic total occlusion of coronary artery: Secondary | ICD-10-CM | POA: Diagnosis not present

## 2014-03-18 DIAGNOSIS — I129 Hypertensive chronic kidney disease with stage 1 through stage 4 chronic kidney disease, or unspecified chronic kidney disease: Secondary | ICD-10-CM | POA: Insufficient documentation

## 2014-03-18 DIAGNOSIS — Z7982 Long term (current) use of aspirin: Secondary | ICD-10-CM | POA: Insufficient documentation

## 2014-03-18 DIAGNOSIS — I200 Unstable angina: Secondary | ICD-10-CM | POA: Diagnosis present

## 2014-03-18 DIAGNOSIS — E785 Hyperlipidemia, unspecified: Secondary | ICD-10-CM | POA: Diagnosis present

## 2014-03-18 HISTORY — PX: CORONARY ANGIOPLASTY WITH STENT PLACEMENT: SHX49

## 2014-03-18 HISTORY — PX: PERCUTANEOUS CORONARY STENT INTERVENTION (PCI-S): SHX5485

## 2014-03-18 HISTORY — PX: LEFT AND RIGHT HEART CATHETERIZATION WITH CORONARY/GRAFT ANGIOGRAM: SHX5448

## 2014-03-18 HISTORY — DX: Unspecified chronic bronchitis: J42

## 2014-03-18 LAB — BASIC METABOLIC PANEL
ANION GAP: 9 (ref 5–15)
BUN: 28 mg/dL — ABNORMAL HIGH (ref 6–23)
CHLORIDE: 100 meq/L (ref 96–112)
CO2: 31 mEq/L (ref 19–32)
CREATININE: 1.59 mg/dL — AB (ref 0.50–1.35)
Calcium: 9.1 mg/dL (ref 8.4–10.5)
GFR calc Af Amer: 48 mL/min — ABNORMAL LOW (ref >90)
GFR calc non Af Amer: 41 mL/min — ABNORMAL LOW (ref >90)
Glucose, Bld: 79 mg/dL (ref 70–99)
POTASSIUM: 4.5 meq/L (ref 3.7–5.3)
SODIUM: 140 meq/L (ref 137–147)

## 2014-03-18 LAB — PROTIME-INR
INR: 1 (ref 0.00–1.49)
PROTHROMBIN TIME: 13.2 s (ref 11.6–15.2)

## 2014-03-18 LAB — POCT I-STAT 3, ART BLOOD GAS (G3+)
ACID-BASE EXCESS: 3 mmol/L — AB (ref 0.0–2.0)
Bicarbonate: 29.4 mEq/L — ABNORMAL HIGH (ref 20.0–24.0)
O2 SAT: 98 %
PO2 ART: 119 mmHg — AB (ref 80.0–100.0)
TCO2: 31 mmol/L (ref 0–100)
pCO2 arterial: 54 mmHg — ABNORMAL HIGH (ref 35.0–45.0)
pH, Arterial: 7.345 — ABNORMAL LOW (ref 7.350–7.450)

## 2014-03-18 LAB — POCT I-STAT 3, VENOUS BLOOD GAS (G3P V)
Acid-Base Excess: 3 mmol/L — ABNORMAL HIGH (ref 0.0–2.0)
Bicarbonate: 29.6 mEq/L — ABNORMAL HIGH (ref 20.0–24.0)
O2 SAT: 74 %
TCO2: 31 mmol/L (ref 0–100)
pCO2, Ven: 55.5 mmHg — ABNORMAL HIGH (ref 45.0–50.0)
pH, Ven: 7.336 — ABNORMAL HIGH (ref 7.250–7.300)
pO2, Ven: 43 mmHg (ref 30.0–45.0)

## 2014-03-18 LAB — POCT ACTIVATED CLOTTING TIME: ACTIVATED CLOTTING TIME: 490 s

## 2014-03-18 SURGERY — LEFT AND RIGHT HEART CATHETERIZATION WITH CORONARY/GRAFT ANGIOGRAM
Anesthesia: LOCAL

## 2014-03-18 MED ORDER — MORPHINE SULFATE 2 MG/ML IJ SOLN: 2.0000 mg | INTRAMUSCULAR | Status: DC | PRN

## 2014-03-18 MED ORDER — ASPIRIN EC 81 MG PO TBEC
81.0000 mg | DELAYED_RELEASE_TABLET | Freq: Every day | ORAL | Status: DC
  Administered 2014-03-19: 81 mg via ORAL
  Filled 2014-03-18: qty 1

## 2014-03-18 MED ORDER — FAMOTIDINE IN NACL 20-0.9 MG/50ML-% IV SOLN
INTRAVENOUS | Status: AC
  Filled 2014-03-18: qty 50

## 2014-03-18 MED ORDER — ALBUTEROL SULFATE HFA 108 (90 BASE) MCG/ACT IN AERS: 2.0000 | INHALATION_SPRAY | Freq: Four times a day (QID) | RESPIRATORY_TRACT | Status: DC | PRN

## 2014-03-18 MED ORDER — ALBUTEROL SULFATE (2.5 MG/3ML) 0.083% IN NEBU: 2.5000 mg | INHALATION_SOLUTION | Freq: Four times a day (QID) | RESPIRATORY_TRACT | Status: DC | PRN

## 2014-03-18 MED ORDER — FENTANYL CITRATE 0.05 MG/ML IJ SOLN
INTRAMUSCULAR | Status: AC
  Filled 2014-03-18: qty 2

## 2014-03-18 MED ORDER — SODIUM CHLORIDE 0.9 % IV SOLN
1.0000 mL/kg/h | INTRAVENOUS | Status: AC
  Administered 2014-03-18 (×2): 1 mL/kg/h via INTRAVENOUS

## 2014-03-18 MED ORDER — SODIUM CHLORIDE 0.9 % IV SOLN
INTRAVENOUS | Status: DC
  Administered 2014-03-18: 09:00:00 via INTRAVENOUS

## 2014-03-18 MED ORDER — SODIUM CHLORIDE 0.9 % IJ SOLN: 3.0000 mL | INTRAMUSCULAR | Status: DC | PRN

## 2014-03-18 MED ORDER — SODIUM CHLORIDE 0.9 % IV SOLN: 250.0000 mL | INTRAVENOUS | Status: DC | PRN

## 2014-03-18 MED ORDER — SODIUM CHLORIDE 0.9 % IJ SOLN: 3.0000 mL | Freq: Two times a day (BID) | INTRAMUSCULAR | Status: DC

## 2014-03-18 MED ORDER — ATORVASTATIN CALCIUM 40 MG PO TABS
40.0000 mg | ORAL_TABLET | Freq: Every day | ORAL | Status: DC
  Administered 2014-03-18: 40 mg via ORAL
  Filled 2014-03-18 (×2): qty 1

## 2014-03-18 MED ORDER — BIVALIRUDIN 250 MG IV SOLR
INTRAVENOUS | Status: AC
  Filled 2014-03-18: qty 250

## 2014-03-18 MED ORDER — OXYCODONE-ACETAMINOPHEN 5-325 MG PO TABS: 1.0000 | ORAL_TABLET | ORAL | Status: DC | PRN

## 2014-03-18 MED ORDER — ASPIRIN 81 MG PO CHEW: 81.0000 mg | CHEWABLE_TABLET | ORAL | Status: DC

## 2014-03-18 MED ORDER — PREDNISONE 20 MG PO TABS
20.0000 mg | ORAL_TABLET | Freq: Every day | ORAL | Status: DC
  Administered 2014-03-18: 14:00:00 20 mg via ORAL
  Filled 2014-03-18 (×2): qty 1

## 2014-03-18 MED ORDER — CLOPIDOGREL BISULFATE 300 MG PO TABS
ORAL_TABLET | ORAL | Status: AC
  Filled 2014-03-18: qty 2

## 2014-03-18 MED ORDER — HEPARIN (PORCINE) IN NACL 2-0.9 UNIT/ML-% IJ SOLN
INTRAMUSCULAR | Status: AC
Start: 2014-03-18 — End: 2014-03-18
  Filled 2014-03-18: qty 1500

## 2014-03-18 MED ORDER — ACETAMINOPHEN 325 MG PO TABS: 650.0000 mg | ORAL_TABLET | ORAL | Status: DC | PRN

## 2014-03-18 MED ORDER — METOPROLOL TARTRATE 50 MG PO TABS
50.0000 mg | ORAL_TABLET | Freq: Two times a day (BID) | ORAL | Status: DC
  Administered 2014-03-18 – 2014-03-19 (×2): 50 mg via ORAL
  Filled 2014-03-18 (×3): qty 1

## 2014-03-18 MED ORDER — HEPARIN SODIUM (PORCINE) 1000 UNIT/ML IJ SOLN
INTRAMUSCULAR | Status: AC
  Filled 2014-03-18: qty 1

## 2014-03-18 MED ORDER — LIDOCAINE HCL (PF) 1 % IJ SOLN
INTRAMUSCULAR | Status: AC
  Filled 2014-03-18: qty 30

## 2014-03-18 MED ORDER — PREDNISONE 20 MG PO TABS: 20.0000 mg | ORAL_TABLET | Freq: Every day | ORAL | Status: DC

## 2014-03-18 MED ORDER — VERAPAMIL HCL 2.5 MG/ML IV SOLN
INTRAVENOUS | Status: AC
  Filled 2014-03-18: qty 4

## 2014-03-18 MED ORDER — PANTOPRAZOLE SODIUM 40 MG PO TBEC
40.0000 mg | DELAYED_RELEASE_TABLET | Freq: Every day | ORAL | Status: DC
  Administered 2014-03-19: 11:00:00 40 mg via ORAL
  Filled 2014-03-18: qty 1

## 2014-03-18 MED ORDER — MIDAZOLAM HCL 2 MG/2ML IJ SOLN
INTRAMUSCULAR | Status: AC
  Filled 2014-03-18: qty 2

## 2014-03-18 MED ORDER — ENALAPRIL MALEATE 10 MG PO TABS
10.0000 mg | ORAL_TABLET | Freq: Two times a day (BID) | ORAL | Status: DC
  Filled 2014-03-18: qty 1

## 2014-03-18 MED ORDER — CLOPIDOGREL BISULFATE 75 MG PO TABS
75.0000 mg | ORAL_TABLET | Freq: Every day | ORAL | Status: DC
  Administered 2014-03-19: 08:00:00 75 mg via ORAL
  Filled 2014-03-18: qty 1

## 2014-03-18 MED ORDER — MIDAZOLAM HCL 2 MG/2ML IJ SOLN
INTRAMUSCULAR | Status: AC
Start: 2014-03-18 — End: 2014-03-18
  Filled 2014-03-18: qty 2

## 2014-03-18 MED ORDER — ONDANSETRON HCL 4 MG/2ML IJ SOLN: 4.0000 mg | Freq: Four times a day (QID) | INTRAMUSCULAR | Status: DC | PRN

## 2014-03-18 NOTE — Progress Notes (Addendum)
Post-PCI check:  Pt doing well without chest pain or dyspnea. Radial site clear. Reviewed plan with patient. Recommend staged PCI of the SVG-diag next week. He will be scheduled for Wednesday. Hold enalapril until after PCI next week. Schedule so that he arrives 4 hours before PCI for hydration.  Orders written for procedure next week.  Sherren Mocha 03/18/2014 6:31 PM

## 2014-03-18 NOTE — Interval H&P Note (Signed)
History and Physical Interval Note:  03/18/2014 10:40 AM  Paul Sandoval  has presented today for surgery, with the diagnosis of unstable angina  The various methods of treatment have been discussed with the patient and family. After consideration of risks, benefits and other options for treatment, the patient has consented to  Procedure(s): LEFT AND RIGHT HEART CATHETERIZATION WITH CORONARY/GRAFT ANGIOGRAM (N/A) as a surgical intervention .  The patient's history has been reviewed, patient examined, no change in status, stable for surgery.  I have reviewed the patient's chart and labs.  Questions were answered to the patient's satisfaction.    Cath Lab Visit (complete for each Cath Lab visit)  Clinical Evaluation Leading to the Procedure:   ACS: No.  Non-ACS:    Anginal Classification: CCS II  Anti-ischemic medical therapy: Minimal Therapy (1 class of medications)  Non-Invasive Test Results: No non-invasive testing performed  Prior CABG: Previous CABG       Sherren Mocha

## 2014-03-18 NOTE — H&P (View-Only) (Signed)
Patient ID: Paul Sandoval, male   DOB: [birth year], 73 y.o.   MRN: [id number]    73 yo previously seen by DR Myrtie Neither 5/15 .  Distant history of CABG in 1996 History of carotid disease and aortic stent graft followed by Dr Kellie Simmering.  Very active  Has walked 2 miles/dayfor years and mows his lawn.  Has had chest pressure and dyspnea for about 8 days.  Exertional.  Does not go away quickly.  No rest pain.  Seen in ER 7/20 Some relief with albuterol  Has some asthma/COPD but quit smoking in 96  No cough sputum or fever  CXR in ER NAD.  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  No recent evaluation of EF  Compliant with meds and ASA.       ROS: Denies fever, malais, weight loss, blurry vision, decreased visual acuity, cough, sputum, SOB, hemoptysis, pleuritic pain, palpitaitons, heartburn, abdominal pain, melena, lower extremity edema, claudication, or rash.  All other systems reviewed and negative   General: Affect appropriate Healthy:  appears stated age 27: normal Neck supple with no adenopathy JVP normal left  bruits no thyromegaly Lungs clear with no wheezing and good diaphragmatic motion Heart:  S1/S2 no murmur,rub, gallop or click PMI normal Abdomen: benighn, BS positve, no tenderness, no AAA no bruit.  No HSM or HJR Distal pulses intact with no bruits No edema Neuro non-focal Skin warm and dry No muscular weakness  Medications Current Outpatient Prescriptions  Medication Sig Dispense Refill  . acetaminophen (TYLENOL) 500 MG tablet Take 1,000 mg by mouth every 6 (six) hours as needed for pain.      Marland Kitchen aspirin EC 81 MG tablet Take 81 mg by mouth daily.      . enalapril (VASOTEC) 10 MG tablet Take 10 mg by mouth 3 (three) times daily.       . Ferrous Sulfate (IRON) 28 MG TABS Take 28 mg by mouth daily.      . hydrochlorothiazide 25 MG tablet Take 12.5 mg by mouth daily.       . metoprolol  (LOPRESSOR) 50 MG tablet Take 50 mg by mouth 2 (two) times daily.       . Multiple Vitamins-Minerals (CENTRUM SILVER ADULT 50+) TABS Take 1 tablet by mouth daily.       Marland Kitchen omeprazole (PRILOSEC) 20 MG capsule Take 20 mg by mouth daily.        . predniSONE (DELTASONE) 10 MG tablet Take 2 tablets (20 mg total) by mouth daily.  10 tablet  0  . simvastatin (ZOCOR) 80 MG tablet Take 40 mg by mouth at bedtime.      . VENTOLIN HFA 108 (90 BASE) MCG/ACT inhaler INHALE 2 PUFFS BY MOUTH EVERY 4 TO 6 HOURS AS NEEDED FOR WHEEZING.  18 g  5   No current facility-administered medications for this visit.    Allergies Neomycin  Family History: Family History  Problem Relation Age of Onset  . Colon cancer Neg Hx   . Colon polyps Neg Hx   . Heart disease Father   . Arthritis Mother   . Parkinsonism Mother   . Arthritis Sister     Brother with rheumatoid arthritis  . Cancer Father     Lung  . Hypertension Brother     Social History: History   Social History  . Marital Status: Married    Spouse Name: N/A  Number of Children: 1  . Years of Education: N/A   Occupational History  . Retired     [employer]   Social History Main Topics  . Smoking status: Former Smoker -- 1.50 packs/day for 30 years    Types: Cigarettes    Quit date: 01/08/1995  . Smokeless tobacco: Never Used  . Alcohol Use: No  . Drug Use: No  . Sexual Activity: Not on file   Other Topics Concern  . Not on file   Social History Narrative  . No narrative on file    Electrocardiogram:  SR RBBB anterolateral MI  No change from 2014  Assessment and Plan

## 2014-03-18 NOTE — Progress Notes (Signed)
TR BAND REMOVAL  LOCATION:    left radial  DEFLATED PER PROTOCOL:    Yes.    TIME BAND OFF / DRESSING APPLIED:    1630    SITE UPON ARRIVAL:    Level 0   SITE AFTER BAND REMOVAL:    Level 0   REVERSE ALLEN'S TEST:     negative   CIRCULATION SENSATION AND MOVEMENT:    Within Normal Limits   Yes.    COMMENTS:   Patient tolerated procedure well

## 2014-03-18 NOTE — CV Procedure (Signed)
Cardiac Catheterization Procedure Note  [name] MRN: [id number] DOB: [birth year]  Procedure: Right Heart Cath, Left Heart Cath, Selective Coronary Angiography, LV angiography  Indication: Chest pain with exertion, CCS Class 2 symptoms but progressive in nature, known CAD s/p CABG  Procedural Details: The right antecubital fossa was prepped, draped, and anesthetized with 1% lidocaine. Using the modified Seldinger technique a 5 French sheath was placed in the right antecubital vein. The left wrist was prepped, draped, and anesthetized with 1% lidocaine. Using the modified Seldinger technique, a 5/6 French slender sheath was inserted into the right radial artery without difficulty.  A Swan-Ganz catheter was used for the right heart catheterization. Standard protocol was followed for recording of right heart pressures and sampling of oxygen saturations. Fick cardiac output was calculated. Standard Judkins catheters were used for selective coronary angiography, LIMA angiography, and saphenous vein graft angiography. There were no immediate procedural complications. The patient was transferred to the post catheterization recovery area for further monitoring.  Procedural Findings: Hemodynamics RA 3 RV 24/6 PA 22/0 with a mean of 17 PCWP 14 LV 102/16 AO 98/44  Oxygen saturations: PA 74 AO 98  Cardiac Output (Fick) 6.0  Cardiac Index (Fick) 2.9   Coronary angiography: Coronary dominance: right  Left mainstem: The left main is heavily calcified. There is a severe ulcerated plaque in the distal left main with a linear area of disease or a fissure or dissection. There is 90% stenosis involving the distal left main/ostial circumflex interface. The proximal LAD has 40% stenosis involving the ostium.  Left anterior descending (LAD): There is 40% ostial stenosis. The LAD is totally occluded after the first septal perforator  Left circumflex (LCx): There is a 90% lesion extending  from the distal left main into the ostium of the left circumflex. The proximal left circumflex has moderate calcification. Just before the bifurcation of the first OM and the AV circumflex there is 40-50% hypodense stenosis. The first obtuse marginal branch has diffuse 70-80% stenosis. The second OM has focal 75% stenosis. The AV circumflex has nonobstructive disease.  Right coronary artery (RCA): Severely calcified, totally occluded vessel in the proximal portion.  Saphenous vein graft to diagonal: The vein graft has 90% stenosis in the proximal body extending nearly back to the ostium. The remaining portions of the graft are patent and the distal target vessel is patent.  Saphenous vein graft to PDA: The graft is patent with no obstructive disease. The PDA is patent. The posterolateral branch fills retrograde.  LIMA to LAD: Widely patent throughout: There is no obstructive disease. The mid and distal LAD fill entirely from the LIMA graft. The LAD fills in retrograde fashion back to its proximal occlusion.  Left ventriculography: Deferred because of chronic kidney disease  PCI Note: There is severe disease involving the distal left main and saphenous vein graft to diagonal. The patient has chronic kidney disease and I elected to treat his left main which is the most critical lesion. He will need staged PCI of the saphenous vein graft to diagonal. The patient was loaded with clopidogrel 600 mg. Angiomax was used for anticoagulation. Therapeutic ACT was achieved. An XB 3.5 cm guide catheter was used. A cougar guidewire was used to cross the lesion and it was advanced into the left circumflex. The lesion was predilated with a 2.5 mm balloon. The lesion was stented from the ostium of the left mainstem into the proximal left circumflex with a 3.5 x 20 mm Promus DES.  The stent was deployed at 14 atmospheres. The stented segment was postdilated with a 4.0 x 15 mm noncompliant balloon to a maximum pressure of  18 atmospheres. There was 0% residual stenosis and TIMI-3 flow at the completion of the procedure.  Lesion Data: Left main/distal 90% stenosis, TIMI-3 flow Stent: 3.5x20 mm Promus DES 0% post-stenosis with TIMI-3 flow  Final Conclusions:   1. Severe three-vessel coronary artery disease with total occlusion of the RCA, total occlusion of the LAD, severe ulcerated stenosis of the distal left main, and severe diffuse stenosis of the OM left circumflex branches  2. Status post CABG with continued patency of the saphenous vein graft to PDA, LIMA to LAD, and patent but severely diseased saphenous vein graft to diagonal  3. Successful PCI of the distal left main with a drug-eluting stent  4. Known mild LV dysfunction by echo, LVEF 45%  Recommendations: Dual antiplatelet therapy with aspirin and Plavix for at least 12 months. Staged PCI of the saphenous vein graft to diagonal. With remote CABG, native vessel and graft disease, and PAD, consider long-term DAPT if tolerated.  Sherren Mocha 03/18/2014, 11:55 AM

## 2014-03-19 ENCOUNTER — Other Ambulatory Visit: Payer: Self-pay | Admitting: Physician Assistant

## 2014-03-19 DIAGNOSIS — I209 Angina pectoris, unspecified: Secondary | ICD-10-CM | POA: Diagnosis not present

## 2014-03-19 DIAGNOSIS — I2510 Atherosclerotic heart disease of native coronary artery without angina pectoris: Secondary | ICD-10-CM | POA: Diagnosis not present

## 2014-03-19 DIAGNOSIS — I25810 Atherosclerosis of coronary artery bypass graft(s) without angina pectoris: Secondary | ICD-10-CM | POA: Diagnosis not present

## 2014-03-19 DIAGNOSIS — I25701 Atherosclerosis of coronary artery bypass graft(s), unspecified, with angina pectoris with documented spasm: Secondary | ICD-10-CM

## 2014-03-19 DIAGNOSIS — I2584 Coronary atherosclerosis due to calcified coronary lesion: Secondary | ICD-10-CM | POA: Diagnosis not present

## 2014-03-19 DIAGNOSIS — I10 Essential (primary) hypertension: Secondary | ICD-10-CM

## 2014-03-19 LAB — CBC
HEMATOCRIT: 39.6 % (ref 39.0–52.0)
HEMOGLOBIN: 12.8 g/dL — AB (ref 13.0–17.0)
MCH: 29.8 pg (ref 26.0–34.0)
MCHC: 32.3 g/dL (ref 30.0–36.0)
MCV: 92.1 fL (ref 78.0–100.0)
Platelets: 108 10*3/uL — ABNORMAL LOW (ref 150–400)
RBC: 4.3 MIL/uL (ref 4.22–5.81)
RDW: 13.7 % (ref 11.5–15.5)
WBC: 9.1 10*3/uL (ref 4.0–10.5)

## 2014-03-19 LAB — BASIC METABOLIC PANEL
ANION GAP: 11 (ref 5–15)
BUN: 26 mg/dL — ABNORMAL HIGH (ref 6–23)
CALCIUM: 8.3 mg/dL — AB (ref 8.4–10.5)
CHLORIDE: 100 meq/L (ref 96–112)
CO2: 24 meq/L (ref 19–32)
CREATININE: 1.35 mg/dL (ref 0.50–1.35)
GFR calc Af Amer: 58 mL/min — ABNORMAL LOW (ref >90)
GFR calc non Af Amer: 50 mL/min — ABNORMAL LOW (ref >90)
GLUCOSE: 122 mg/dL — AB (ref 70–99)
Potassium: 4.9 mEq/L (ref 3.7–5.3)
Sodium: 135 mEq/L — ABNORMAL LOW (ref 137–147)

## 2014-03-19 MED ORDER — CLOPIDOGREL BISULFATE 75 MG PO TABS: 75.0000 mg | ORAL_TABLET | Freq: Every day | ORAL | Status: DC

## 2014-03-19 MED ORDER — HYDROCHLOROTHIAZIDE 25 MG PO TABS: 12.5000 mg | ORAL_TABLET | Freq: Every day | ORAL | Status: DC

## 2014-03-19 MED FILL — Sodium Chloride IV Soln 0.9%: INTRAVENOUS | Qty: 50 | Status: AC

## 2014-03-19 NOTE — Progress Notes (Signed)
CARDIAC REHAB PHASE I   PRE:  Rate/Rhythm: 65 SR  BP:  Supine:   Sitting: 159/68  Standing:    SaO2:   MODE:  Ambulation: 1000 ft   POST:  Rate/Rhythm: 93 SR  BP:  Supine:   Sitting: 170/70  Large cuff, 142/70 manual  Standing:    SaO2:  0735-0820 Pt walked 1000 ft with steady gait. No CP. Tolerated well. Education completed except ex and CRP 2. Will refer to cardiologist for activity since pt for staged PCI next week. Will follow up ed next week after procedure. Took BP manually with smaller cuff and BP better. Large cuff used for thigh BPs per RN.   Graylon Good, RN BSN  03/19/2014 8:16 AM

## 2014-03-19 NOTE — Progress Notes (Signed)
Site area: right brachial v.  Site Prior to Removal:  Level 0  Pressure Applied For 10 MINUTES    Minutes Beginning at 1400  Manual:   Yes.    Patient Status During Pull:  stable  Post Pull Groin Site:  Level 1  Post Pull Instructions Given:  Yes.    Post Pull Pulses Present:  Yes.    Dressing Applied:  Yes.    Comments:

## 2014-03-19 NOTE — Discharge Summary (Signed)
The patient is ambulatory this morning and is not having angina. The cath site is unremarkable. It is noted that he would have a staged PCI procedure coming back for the saphenous vein graft to the diagonal. Plan discharge today on dual antiplatelet therapy. He should limit physical activity. He should call if angina. The importance of dual antiplatelet therapy was discussed.  The discharge summary and management was crafted under my direction and is accurate.

## 2014-03-19 NOTE — Progress Notes (Signed)
Please see the discharge summary comments.

## 2014-03-19 NOTE — Discharge Summary (Signed)
Physician Discharge Summary    Cardiologist:  Jacinta Shoe   Patient ID: Paul Sandoval MRN: [id number] DOB/AGE: 1941/06/15 73 y.o.  Admit date: 03/18/2014 Discharge date: 03/19/2014  Admission Diagnoses:    Angina pectoris  Discharge Diagnoses:  Active Problems:   HYPERLIPIDEMIA   HYPERTENSION   CAD (coronary artery disease)   Other and unspecified angina pectoris      Discharged Condition: stable  Hospital Course:   73 yo previously seen by DR Myrtie Neither 5/15 . Distant history of CABG in 1996 History of carotid disease and aortic stent graft followed by Dr Kellie Simmering. Very active Has walked 2 miles/dayfor years and mows his lawn. Has had chest pressure and dyspnea for about 8 days. Exertional. Does not go away quickly. No rest pain. Seen in ER 7/20 Some relief with albuterol Has some asthma/COPD but quit smoking in 96 No cough sputum or fever CXR in ER NAD. BNP not checked ECG with ? Anterolateral aneurysm no change from 2014 But suggestion of ST elevation in anterolateral leads. No recent evaluation of EF Compliant with meds and ASA.   The patient was brought in for right and left heart caths revealing severe three-vessel coronary artery disease with total occlusion of the RCA, total occlusion of the LAD, severe ulcerated stenosis of the distal left main, and severe diffuse stenosis of the OM left circumflex branches Status post CABG with continued patency of the saphenous vein graft to PDA, LIMA to LAD, and patent but severely diseased saphenous vein graft to diagonal.  He underwent successful PCI of the distal left main with a drug-eluting stent.  Known mild LV dysfunction by echo, LVEF 45%.   Dual antiplatelet therapy with aspirin and Plavix for at least 12 months. Staged PCI of the saphenous vein graft to diagonal next Wednesday.  Will hold HCTZ starting Tuesday next week. BP and HR stable. SCr improved. He monitors weight regularly.  The patient was seen by Dr. Tamala Julian who felt he was stable  for DC home.   Consults: None  Significant Diagnostic Studies:   Cardiac Catheterization Procedure Note  [name]  MRN: [id number]  DOB: 09/04/1940  Procedure: Right Heart Cath, Left Heart Cath, Selective Coronary Angiography, LV angiography  Indication: Chest pain with exertion, CCS Class 2 symptoms but progressive in nature, known CAD s/p CABG  Procedural Details: The right antecubital fossa was prepped, draped, and anesthetized with 1% lidocaine. Using the modified Seldinger technique a 5 French sheath was placed in the right antecubital vein. The left wrist was prepped, draped, and anesthetized with 1% lidocaine. Using the modified Seldinger technique, a 5/6 French slender sheath was inserted into the right radial artery without difficulty. A Swan-Ganz catheter was used for the right heart catheterization. Standard protocol was followed for recording of right heart pressures and sampling of oxygen saturations. Fick cardiac output was calculated. Standard Judkins catheters were used for selective coronary angiography, LIMA angiography, and saphenous vein graft angiography. There were no immediate procedural complications. The patient was transferred to the post catheterization recovery area for further monitoring.  Procedural Findings:  Hemodynamics  RA 3  RV 24/6  PA 22/0 with a mean of 17  PCWP 14  LV 102/16  AO 98/44  Oxygen saturations:  PA 74  AO 98  Cardiac Output (Fick) 6.0  Cardiac Index (Fick) 2.9  Coronary angiography:  Coronary dominance: right  Left mainstem: The left main is heavily calcified. There is a severe ulcerated plaque in the distal left main  with a linear area of disease or a fissure or dissection. There is 90% stenosis involving the distal left main/ostial circumflex interface. The proximal LAD has 40% stenosis involving the ostium.  Left anterior descending (LAD): There is 40% ostial stenosis. The LAD is totally occluded after the first septal  perforator  Left circumflex (LCx): There is a 90% lesion extending from the distal left main into the ostium of the left circumflex. The proximal left circumflex has moderate calcification. Just before the bifurcation of the first OM and the AV circumflex there is 40-50% hypodense stenosis. The first obtuse marginal branch has diffuse 70-80% stenosis. The second OM has focal 75% stenosis. The AV circumflex has nonobstructive disease.  Right coronary artery (RCA): Severely calcified, totally occluded vessel in the proximal portion.  Saphenous vein graft to diagonal: The vein graft has 90% stenosis in the proximal body extending nearly back to the ostium. The remaining portions of the graft are patent and the distal target vessel is patent.  Saphenous vein graft to PDA: The graft is patent with no obstructive disease. The PDA is patent. The posterolateral branch fills retrograde.  LIMA to LAD: Widely patent throughout: There is no obstructive disease. The mid and distal LAD fill entirely from the LIMA graft. The LAD fills in retrograde fashion back to its proximal occlusion.  Left ventriculography: Deferred because of chronic kidney disease  PCI Note:  There is severe disease involving the distal left main and saphenous vein graft to diagonal. The patient has chronic kidney disease and I elected to treat his left main which is the most critical lesion. He will need staged PCI of the saphenous vein graft to diagonal. The patient was loaded with clopidogrel 600 mg. Angiomax was used for anticoagulation. Therapeutic ACT was achieved. An XB 3.5 cm guide catheter was used. A cougar guidewire was used to cross the lesion and it was advanced into the left circumflex. The lesion was predilated with a 2.5 mm balloon. The lesion was stented from the ostium of the left mainstem into the proximal left circumflex with a 3.5 x 20 mm Promus DES. The stent was deployed at 14 atmospheres. The stented segment was postdilated  with a 4.0 x 15 mm noncompliant balloon to a maximum pressure of 18 atmospheres. There was 0% residual stenosis and TIMI-3 flow at the completion of the procedure.  Lesion Data:  Left main/distal  90% stenosis, TIMI-3 flow  Stent: 3.5x20 mm Promus DES  0% post-stenosis with TIMI-3 flow  Final Conclusions:  1. Severe three-vessel coronary artery disease with total occlusion of the RCA, total occlusion of the LAD, severe ulcerated stenosis of the distal left main, and severe diffuse stenosis of the OM left circumflex branches  2. Status post CABG with continued patency of the saphenous vein graft to PDA, LIMA to LAD, and patent but severely diseased saphenous vein graft to diagonal  3. Successful PCI of the distal left main with a drug-eluting stent  4. Known mild LV dysfunction by echo, LVEF 45%  Recommendations: Dual antiplatelet therapy with aspirin and Plavix for at least 12 months. Staged PCI of the saphenous vein graft to diagonal. With remote CABG, native vessel and graft disease, and PAD, consider long-term DAPT if tolerated.  Sherren Mocha  03/18/2014, 11:55 AM   Treatments: See above  Discharge Exam: Blood pressure 141/54, pulse 72, temperature 97.7 F (36.5 C), temperature source Oral, resp. rate 18, height 5\' 10"  (1.778 m), weight 205 lb 4 oz (93.1 kg), SpO2  96.00%.   Disposition: 01-Home or Self Care      Discharge Instructions   Diet - low sodium heart healthy    Complete by:  As directed      Discharge instructions    Complete by:  As directed   No Lifting with right arm for three days.  Heart cath scheduled for July 29.     Increase activity slowly    Complete by:  As directed             Medication List         acetaminophen 500 MG tablet  Commonly known as:  TYLENOL  Take 1,000 mg by mouth every 6 (six) hours as needed for pain.     aspirin EC 81 MG tablet  Take 81 mg by mouth daily.     CENTRUM SILVER ADULT 50+ Tabs  Take 1 tablet by mouth daily.      clopidogrel 75 MG tablet  Commonly known as:  PLAVIX  Take 1 tablet (75 mg total) by mouth daily with breakfast.     enalapril 10 MG tablet  Commonly known as:  VASOTEC  Take 10 mg by mouth 3 (three) times daily.     hydrochlorothiazide 25 MG tablet  Commonly known as:  HYDRODIURIL  Take 0.5 tablets (12.5 mg total) by mouth daily.     Iron 28 MG Tabs  Take 28 mg by mouth daily.     metoprolol 50 MG tablet  Commonly known as:  LOPRESSOR  Take 50 mg by mouth 2 (two) times daily.     nitroGLYCERIN 0.4 MG SL tablet  Commonly known as:  NITROSTAT  Place 1 tablet (0.4 mg total) under the tongue every 5 (five) minutes as needed for chest pain.     omeprazole 20 MG capsule  Commonly known as:  PRILOSEC  Take 20 mg by mouth daily.     predniSONE 10 MG tablet  Commonly known as:  DELTASONE  Take 2 tablets (20 mg total) by mouth daily.     simvastatin 80 MG tablet  Commonly known as:  ZOCOR  Take 40 mg by mouth at bedtime.     VENTOLIN HFA 108 (90 BASE) MCG/ACT inhaler  Generic drug:  albuterol  INHALE 2 PUFFS BY MOUTH EVERY 4 TO 6 HOURS AS NEEDED FOR WHEEZING.       Follow-up Information   Follow up with Heart Cath  On 03/24/2014.     Greater than 30 minutes was spent completing the patient's discharge.    SignedTarri Fuller, PA-C 03/19/2014, 10:24 AM

## 2014-03-19 NOTE — Progress Notes (Signed)
Subjective: No complaints  Objective: Vital signs in last 24 hours: Temp:  [97.5 F (36.4 C)-97.9 F (36.6 C)] 97.7 F (36.5 C) (07/24 0300) Pulse Rate:  [53-72] 72 (07/23 2003) Resp:  [18] 18 (07/24 0300) BP: (110-161)/(36-76) 141/54 mmHg (07/24 0300) SpO2:  [96 %-100 %] 96 % (07/24 0300) Weight:  [198 lb (89.812 kg)-205 lb 4 oz (93.1 kg)] 205 lb 4 oz (93.1 kg) (07/24 0005) Last BM Date: 03/18/14  Intake/Output from previous day: 07/23 0701 - 07/24 0700 In: 984 [P.O.:220; I.V.:764] Out: 1800 [Urine:1800] Intake/Output this shift: Total I/O In: 764 [I.V.:764] Out: 900 [Urine:900]  Medications Current Facility-Administered Medications  Medication Dose Route Frequency Provider Last Rate Last Dose  . acetaminophen (TYLENOL) tablet 650 mg  650 mg Oral Q4H PRN Sherren Mocha, MD      . albuterol (PROVENTIL) (2.5 MG/3ML) 0.083% nebulizer solution 2.5 mg  2.5 mg Nebulization Q6H PRN Sherren Mocha, MD      . aspirin EC tablet 81 mg  81 mg Oral Daily Sherren Mocha, MD      . atorvastatin (LIPITOR) tablet 40 mg  40 mg Oral q1800 Sherren Mocha, MD   40 mg at 03/18/14 1751  . clopidogrel (PLAVIX) tablet 75 mg  75 mg Oral Q breakfast Sherren Mocha, MD      . metoprolol (LOPRESSOR) tablet 50 mg  50 mg Oral BID Sherren Mocha, MD   50 mg at 03/18/14 2034  . morphine 2 MG/ML injection 2 mg  2 mg Intravenous Q1H PRN Sherren Mocha, MD      . ondansetron [facility]) injection 4 mg  4 mg Intravenous Q6H PRN Sherren Mocha, MD      . oxyCODONE-acetaminophen (PERCOCET/ROXICET) 5-325 MG per tablet 1-2 tablet  1-2 tablet Oral Q4H PRN Sherren Mocha, MD      . pantoprazole (PROTONIX) EC tablet 40 mg  40 mg Oral Daily Sherren Mocha, MD      . predniSONE (DELTASONE) tablet 20 mg  20 mg Oral Daily Sherren Mocha, MD   20 mg at 03/18/14 1331    PE: General appearance: alert, cooperative and no distress Lungs: clear to auscultation bilaterally Heart: regular rate and rhythm, S1, S2 normal, no  murmur, click, rub or gallop Extremities: No LEE Pulses: 2+ and symmetric Skin: Warm and dry Neurologic: Grossly normal  Lab Results:   Recent Labs  03/19/14 0257  WBC 9.1  HGB 12.8*  HCT 39.6  PLT 108*   BMET  Recent Labs  03/18/14 0855 03/19/14 0257  NA 140 135*  K 4.5 4.9  CL 100 100  CO2 31 24  GLUCOSE 79 122*  BUN 28* 26*  CREATININE 1.59* 1.35  CALCIUM 9.1 8.3*   PT/INR  Recent Labs  03/18/14 0855  LABPROT 13.2  INR 1.00     Assessment/Plan   Active Problems:   Other and unspecified angina pectoris   CAD  Plan: SP Left heart cath revealing: 1. Severe three-vessel coronary artery disease with total occlusion of the RCA, total occlusion of the LAD, severe ulcerated stenosis of the distal left main, and severe diffuse stenosis of the OM left circumflex branches  2. Status post CABG with continued patency of the saphenous vein graft to PDA, LIMA to LAD, and patent but severely diseased saphenous vein graft to diagonal  3. Successful PCI of the distal left main with a drug-eluting stent  4. Known mild LV dysfunction by echo, LVEF 45%   Dual antiplatelet therapy with aspirin and  Plavix for at least 12 months. Staged PCI of the saphenous vein graft to diagonal next Wednesday. With remote CABG, native vessel and graft disease, and PAD, consider long-term DAPT if tolerated.  Will hold HCTZ starting Tuesday next week.  BP and HR stable.  SCr improved.   He monitors weight regularly.  DC home today.    LOS: 1 day    Ayven Glasco PA-C 03/19/2014 6:58 AM

## 2014-03-22 ENCOUNTER — Telehealth: Payer: Self-pay | Admitting: Cardiovascular Disease

## 2014-03-22 NOTE — Telephone Encounter (Signed)
Pt is aware that he is scheduled for a cardiac cath in the hospital for 03/24/14 at 1:00: pt to arrive at [facility] of [facility] 11:00 AM . Pt is to be NPO after MN. Pt can take all his medication in the AM sips of water. Pt is aware of other instructions. Pt verbalized understanding.

## 2014-03-22 NOTE — Telephone Encounter (Signed)
Follow up:     Pt call needs a call back about his  proceude 7/29 TODAY please.   Pt needs to know what he can and can not do.

## 2014-03-24 ENCOUNTER — Encounter (HOSPITAL_COMMUNITY): Payer: Self-pay | Admitting: General Practice

## 2014-03-24 ENCOUNTER — Ambulatory Visit (HOSPITAL_COMMUNITY)
Admission: RE | Admit: 2014-03-24 | Discharge: 2014-03-25 | Disposition: A | Discharge status: Home / Self Care | Payer: Medicare Other | Source: Ambulatory Visit | Attending: Cardiovascular Disease | Admitting: Cardiovascular Disease

## 2014-03-24 ENCOUNTER — Encounter (HOSPITAL_COMMUNITY)
Admission: RE | Disposition: A | Discharge status: Home / Self Care | Payer: Self-pay | Source: Ambulatory Visit | Attending: Cardiovascular Disease

## 2014-03-24 DIAGNOSIS — Z87891 Personal history of nicotine dependence: Secondary | ICD-10-CM | POA: Insufficient documentation

## 2014-03-24 DIAGNOSIS — J4489 Other specified chronic obstructive pulmonary disease: Secondary | ICD-10-CM | POA: Insufficient documentation

## 2014-03-24 DIAGNOSIS — E785 Hyperlipidemia, unspecified: Secondary | ICD-10-CM | POA: Diagnosis not present

## 2014-03-24 DIAGNOSIS — I2510 Atherosclerotic heart disease of native coronary artery without angina pectoris: Secondary | ICD-10-CM | POA: Diagnosis present

## 2014-03-24 DIAGNOSIS — I200 Unstable angina: Secondary | ICD-10-CM | POA: Diagnosis not present

## 2014-03-24 DIAGNOSIS — I2589 Other forms of chronic ischemic heart disease: Secondary | ICD-10-CM | POA: Insufficient documentation

## 2014-03-24 DIAGNOSIS — I10 Essential (primary) hypertension: Secondary | ICD-10-CM | POA: Diagnosis not present

## 2014-03-24 DIAGNOSIS — I2582 Chronic total occlusion of coronary artery: Secondary | ICD-10-CM | POA: Insufficient documentation

## 2014-03-24 DIAGNOSIS — I25810 Atherosclerosis of coronary artery bypass graft(s) without angina pectoris: Secondary | ICD-10-CM | POA: Insufficient documentation

## 2014-03-24 DIAGNOSIS — I209 Angina pectoris, unspecified: Secondary | ICD-10-CM | POA: Diagnosis present

## 2014-03-24 DIAGNOSIS — I255 Ischemic cardiomyopathy: Secondary | ICD-10-CM

## 2014-03-24 DIAGNOSIS — J449 Chronic obstructive pulmonary disease, unspecified: Secondary | ICD-10-CM | POA: Insufficient documentation

## 2014-03-24 HISTORY — DX: Ischemic cardiomyopathy: I25.5

## 2014-03-24 HISTORY — PX: PERCUTANEOUS CORONARY STENT INTERVENTION (PCI-S): SHX5485

## 2014-03-24 HISTORY — PX: CORONARY ANGIOPLASTY WITH STENT PLACEMENT: SHX49

## 2014-03-24 LAB — CBC
HCT: 41 % (ref 39.0–52.0)
HEMOGLOBIN: 13.4 g/dL (ref 13.0–17.0)
MCH: 29.8 pg (ref 26.0–34.0)
MCHC: 32.7 g/dL (ref 30.0–36.0)
MCV: 91.3 fL (ref 78.0–100.0)
Platelets: 122 10*3/uL — ABNORMAL LOW (ref 150–400)
RBC: 4.49 MIL/uL (ref 4.22–5.81)
RDW: 13.6 % (ref 11.5–15.5)
WBC: 11.5 10*3/uL — ABNORMAL HIGH (ref 4.0–10.5)

## 2014-03-24 LAB — BASIC METABOLIC PANEL
Anion gap: 11 (ref 5–15)
BUN: 20 mg/dL (ref 6–23)
CHLORIDE: 99 meq/L (ref 96–112)
CO2: 30 meq/L (ref 19–32)
CREATININE: 1.55 mg/dL — AB (ref 0.50–1.35)
Calcium: 8.8 mg/dL (ref 8.4–10.5)
GFR calc Af Amer: 49 mL/min — ABNORMAL LOW (ref >90)
GFR calc non Af Amer: 42 mL/min — ABNORMAL LOW (ref >90)
Glucose, Bld: 84 mg/dL (ref 70–99)
Potassium: 4.7 mEq/L (ref 3.7–5.3)
Sodium: 140 mEq/L (ref 137–147)

## 2014-03-24 LAB — PROTIME-INR
INR: 1.03 (ref 0.00–1.49)
Prothrombin Time: 13.5 seconds (ref 11.6–15.2)

## 2014-03-24 LAB — POCT ACTIVATED CLOTTING TIME: Activated Clotting Time: 473 seconds

## 2014-03-24 SURGERY — PERCUTANEOUS CORONARY STENT INTERVENTION (PCI-S)
Anesthesia: LOCAL

## 2014-03-24 MED ORDER — MIDAZOLAM HCL 2 MG/2ML IJ SOLN
INTRAMUSCULAR | Status: AC
  Filled 2014-03-24: qty 2

## 2014-03-24 MED ORDER — ASPIRIN 81 MG PO CHEW
81.0000 mg | CHEWABLE_TABLET | ORAL | Status: AC
  Administered 2014-03-24: 81 mg via ORAL

## 2014-03-24 MED ORDER — METOPROLOL TARTRATE 50 MG PO TABS
50.0000 mg | ORAL_TABLET | Freq: Two times a day (BID) | ORAL | Status: DC
  Administered 2014-03-24 – 2014-03-25 (×2): 50 mg via ORAL
  Filled 2014-03-24 (×3): qty 1

## 2014-03-24 MED ORDER — SODIUM CHLORIDE 0.9 % IJ SOLN: 3.0000 mL | INTRAMUSCULAR | Status: DC | PRN

## 2014-03-24 MED ORDER — ALBUTEROL SULFATE (2.5 MG/3ML) 0.083% IN NEBU: 2.5000 mg | INHALATION_SOLUTION | Freq: Four times a day (QID) | RESPIRATORY_TRACT | Status: DC | PRN

## 2014-03-24 MED ORDER — HEPARIN SODIUM (PORCINE) 1000 UNIT/ML IJ SOLN
INTRAMUSCULAR | Status: AC
  Filled 2014-03-24: qty 1

## 2014-03-24 MED ORDER — ACETAMINOPHEN 325 MG PO TABS: 650.0000 mg | ORAL_TABLET | ORAL | Status: DC | PRN

## 2014-03-24 MED ORDER — ATORVASTATIN CALCIUM 40 MG PO TABS
40.0000 mg | ORAL_TABLET | Freq: Every day | ORAL | Status: DC
  Administered 2014-03-24: 18:00:00 40 mg via ORAL
  Filled 2014-03-24 (×2): qty 1

## 2014-03-24 MED ORDER — FENTANYL CITRATE 0.05 MG/ML IJ SOLN
INTRAMUSCULAR | Status: AC
  Filled 2014-03-24: qty 2

## 2014-03-24 MED ORDER — ASPIRIN EC 81 MG PO TBEC
81.0000 mg | DELAYED_RELEASE_TABLET | Freq: Every day | ORAL | Status: DC
  Administered 2014-03-25: 81 mg via ORAL
  Filled 2014-03-24 (×2): qty 1

## 2014-03-24 MED ORDER — SODIUM CHLORIDE 0.9 % IV SOLN: 250.0000 mL | INTRAVENOUS | Status: DC | PRN

## 2014-03-24 MED ORDER — ENALAPRIL MALEATE 5 MG PO TABS
15.0000 mg | ORAL_TABLET | Freq: Two times a day (BID) | ORAL | Status: DC
  Administered 2014-03-25: 10:00:00 15 mg via ORAL
  Filled 2014-03-24 (×3): qty 1

## 2014-03-24 MED ORDER — PANTOPRAZOLE SODIUM 40 MG PO TBEC
40.0000 mg | DELAYED_RELEASE_TABLET | Freq: Every day | ORAL | Status: DC
  Administered 2014-03-25: 10:00:00 40 mg via ORAL
  Filled 2014-03-24: qty 1

## 2014-03-24 MED ORDER — ONDANSETRON HCL 4 MG/2ML IJ SOLN: 4.0000 mg | Freq: Four times a day (QID) | INTRAMUSCULAR | Status: DC | PRN

## 2014-03-24 MED ORDER — VERAPAMIL HCL 2.5 MG/ML IV SOLN
INTRAVENOUS | Status: AC
  Filled 2014-03-24: qty 2

## 2014-03-24 MED ORDER — MORPHINE SULFATE 2 MG/ML IJ SOLN: 2.0000 mg | INTRAMUSCULAR | Status: DC | PRN

## 2014-03-24 MED ORDER — HEPARIN (PORCINE) IN NACL 2-0.9 UNIT/ML-% IJ SOLN
INTRAMUSCULAR | Status: AC
  Filled 2014-03-24: qty 1000

## 2014-03-24 MED ORDER — SODIUM CHLORIDE 0.9 % IJ SOLN: 3.0000 mL | Freq: Two times a day (BID) | INTRAMUSCULAR | Status: DC

## 2014-03-24 MED ORDER — HYDROCHLOROTHIAZIDE 12.5 MG PO CAPS
12.5000 mg | ORAL_CAPSULE | Freq: Every day | ORAL | Status: DC
  Administered 2014-03-24 – 2014-03-25 (×2): 12.5 mg via ORAL
  Filled 2014-03-24 (×2): qty 1

## 2014-03-24 MED ORDER — CLOPIDOGREL BISULFATE 75 MG PO TABS
75.0000 mg | ORAL_TABLET | Freq: Every day | ORAL | Status: DC
  Administered 2014-03-25: 75 mg via ORAL
  Filled 2014-03-24: qty 1

## 2014-03-24 MED ORDER — SODIUM CHLORIDE 0.9 % IV SOLN
INTRAVENOUS | Status: DC
  Administered 2014-03-24: 12:00:00 via INTRAVENOUS

## 2014-03-24 MED ORDER — NITROGLYCERIN 1 MG/10 ML FOR IR/CATH LAB
INTRA_ARTERIAL | Status: AC
  Filled 2014-03-24: qty 10

## 2014-03-24 MED ORDER — OXYCODONE-ACETAMINOPHEN 5-325 MG PO TABS: 1.0000 | ORAL_TABLET | ORAL | Status: DC | PRN

## 2014-03-24 MED ORDER — SODIUM CHLORIDE 0.9 % IV SOLN: 1.0000 mL/kg/h | INTRAVENOUS | Status: AC

## 2014-03-24 MED ORDER — ASPIRIN 81 MG PO CHEW
CHEWABLE_TABLET | ORAL | Status: AC
  Administered 2014-03-24: 81 mg via ORAL
  Filled 2014-03-24: qty 1

## 2014-03-24 MED ORDER — CLOPIDOGREL BISULFATE 75 MG PO TABS: 75.0000 mg | ORAL_TABLET | Freq: Once | ORAL | Status: DC

## 2014-03-24 MED ORDER — HYDROCHLOROTHIAZIDE 25 MG PO TABS
12.5000 mg | ORAL_TABLET | Freq: Every day | ORAL | Status: DC
  Filled 2014-03-24: qty 0.5

## 2014-03-24 MED ORDER — LIDOCAINE HCL (PF) 1 % IJ SOLN
INTRAMUSCULAR | Status: AC
  Filled 2014-03-24: qty 30

## 2014-03-24 MED ORDER — ENALAPRIL MALEATE 10 MG PO TABS: 10.0000 mg | ORAL_TABLET | Freq: Three times a day (TID) | ORAL | Status: DC

## 2014-03-24 NOTE — H&P (View-Only) (Signed)
Post-PCI check:  Pt doing well without chest pain or dyspnea. Radial site clear. Reviewed plan with patient. Recommend staged PCI of the SVG-diag next week. He will be scheduled for Wednesday. Hold enalapril until after PCI next week. Schedule so that he arrives 4 hours before PCI for hydration.  Orders written for procedure next week.  Sherren Mocha 03/18/2014 6:31 PM

## 2014-03-24 NOTE — CV Procedure (Signed)
    CARDIAC CATH NOTE  [name] MRN: [id number] DOB: 1940/12/22  Procedure: PTCA and stenting of the SVG-Diagonal  Indication: CCS Class 3 angina, crescendo symptoms. The patient underwent PCI of severe stenosis in the left main last week. He returns today for treatment of severe disease in the SVG-diagonal. The procedure is staged because of CKD.   Procedural Details: The left wrist was prepped, draped, and anesthetized with 1% lidocaine. Using the modified Seldinger technique, a 6 Fr sheath was introduced into the left radial artery. 3 mg verapamil was administered through the radial sheath. Weight-based bivalirudin was given for anticoagulation. Once a therapeutic ACT was achieved, a 6 [place] AL-1 guide catheter was inserted.  A cougar coronary guidewire was used to cross the lesion.  A 35m spider distal embolic protection device was deployed in the distal body of the graft after standard prep. The device was occlusive and there was no flow beyond the lesion after it was deployed. The lesion was predilated with a 2.5 mm balloon.  The lesion was then stented with a 2.75x18 mm Xience Alpine DES stent deployed at 12 atm.  Following PCI, there was 0% residual stenosis and TIMI-3 flow. Final angiography confirmed an excellent result. The patient tolerated the procedure well. There were no immediate procedural complications. A TR band was used for radial hemostasis. The patient was transferred to the post catheterization recovery area for further monitoring.  Lesion Data: Vessel: SVG-Diag/aortic anastomosis Percent stenosis (pre): 95 TIMI-flow (pre):  3 Stent:  2.75x18 mm Xience DES Percent stenosis (post): 0 TIMI-flow (post): 3  Conclusions: Successful PCI of the SVG-diagonal with a DES  Recommendations: DAPT x 12 months. Anticipate d/c tomorrow am.  MSherren Mocha7/29/2015, 3:29 PM

## 2014-03-24 NOTE — Interval H&P Note (Signed)
History and Physical Interval Note:  03/24/2014 2:26 PM  Paul Sandoval  has presented today for surgery, with the diagnosis of coronary artery disease  The various methods of treatment have been discussed with the patient and family. After consideration of risks, benefits and other options for treatment, the patient has consented to  Procedure(s): PERCUTANEOUS CORONARY STENT INTERVENTION (PCI-S) (N/A) as a surgical intervention .  The patient's history has been reviewed, patient examined, no change in status, stable for surgery.  I have reviewed the patient's chart and labs.  Questions were answered to the patient's satisfaction.    Cath Lab Visit (complete for each Cath Lab visit)  Clinical Evaluation Leading to the Procedure:   ACS: No.  Non-ACS:    Anginal Classification: CCS III  Anti-ischemic medical therapy: Minimal Therapy (1 class of medications)  Non-Invasive Test Results: No non-invasive testing performed  Prior CABG: Previous CABG       Sherren Mocha

## 2014-03-24 NOTE — Progress Notes (Signed)
TR BAND REMOVAL  LOCATION:    left radial  DEFLATED PER PROTOCOL:    Yes.    TIME BAND OFF / DRESSING APPLIED:    1900   SITE UPON ARRIVAL:    Level 1( small old bruise, soft)  SITE AFTER BAND REMOVAL:    Level 1  REVERSE ALLEN'S TEST:     positive  CIRCULATION SENSATION AND MOVEMENT:    Within Normal Limits   Yes.    COMMENTS:   Site oozing  before scheduled TRB removal, Manual pressure hold for 10 min ,with good hemostasis

## 2014-03-25 ENCOUNTER — Encounter (HOSPITAL_COMMUNITY): Payer: Self-pay | Admitting: Physician Assistant

## 2014-03-25 DIAGNOSIS — I200 Unstable angina: Secondary | ICD-10-CM | POA: Diagnosis not present

## 2014-03-25 DIAGNOSIS — I10 Essential (primary) hypertension: Secondary | ICD-10-CM

## 2014-03-25 DIAGNOSIS — E785 Hyperlipidemia, unspecified: Secondary | ICD-10-CM | POA: Diagnosis not present

## 2014-03-25 DIAGNOSIS — I25810 Atherosclerosis of coronary artery bypass graft(s) without angina pectoris: Secondary | ICD-10-CM | POA: Diagnosis not present

## 2014-03-25 DIAGNOSIS — I2589 Other forms of chronic ischemic heart disease: Secondary | ICD-10-CM

## 2014-03-25 DIAGNOSIS — I209 Angina pectoris, unspecified: Secondary | ICD-10-CM

## 2014-03-25 LAB — CBC
HCT: 39 % (ref 39.0–52.0)
Hemoglobin: 12.5 g/dL — ABNORMAL LOW (ref 13.0–17.0)
MCH: 29.1 pg (ref 26.0–34.0)
MCHC: 32.1 g/dL (ref 30.0–36.0)
MCV: 90.9 fL (ref 78.0–100.0)
Platelets: 105 10*3/uL — ABNORMAL LOW (ref 150–400)
RBC: 4.29 MIL/uL (ref 4.22–5.81)
RDW: 13.6 % (ref 11.5–15.5)
WBC: 11.7 10*3/uL — ABNORMAL HIGH (ref 4.0–10.5)

## 2014-03-25 LAB — BASIC METABOLIC PANEL
Anion gap: 9 (ref 5–15)
BUN: 21 mg/dL (ref 6–23)
CALCIUM: 8.6 mg/dL (ref 8.4–10.5)
CO2: 27 meq/L (ref 19–32)
Chloride: 101 mEq/L (ref 96–112)
Creatinine, Ser: 1.51 mg/dL — ABNORMAL HIGH (ref 0.50–1.35)
GFR calc Af Amer: 51 mL/min — ABNORMAL LOW (ref >90)
GFR calc non Af Amer: 44 mL/min — ABNORMAL LOW (ref >90)
GLUCOSE: 89 mg/dL (ref 70–99)
Potassium: 5.2 mEq/L (ref 3.7–5.3)
SODIUM: 137 meq/L (ref 137–147)

## 2014-03-25 MED ORDER — PANTOPRAZOLE SODIUM 40 MG PO TBEC: 40.0000 mg | DELAYED_RELEASE_TABLET | Freq: Every day | ORAL | Status: DC

## 2014-03-25 NOTE — Progress Notes (Signed)
[id number] Declined to walk as he does not have his shoes here and it hurts his feet to walk without shoes. Up in room without any difficulty. Very active usually, walking 2 miles daily. Gave ex ed to get back to his 2 miles a day gradually. Has stent card and knows to take plavix. Offered CRP 2. Pt prefers to continue exercising on his own. No questions re education done last week. Graylon Good RN BSN 03/25/2014 9:25 AM

## 2014-03-25 NOTE — Discharge Summary (Addendum)
Physician Discharge Summary    Cardiologist:  Jacinta Shoe  Patient ID: Paul Sandoval MRN: [id number] DOB/AGE: 74-Feb-1942 73 y.o.  Admit date: 03/24/2014 Discharge date: 03/25/2014  Admission Diagnoses:    Other and unspecified angina pectoris  Discharge Diagnoses:  Active Problems:   HYPERLIPIDEMIA   HYPERTENSION   CAD (coronary artery disease)   Other and unspecified angina pectoris   Cardiomyopathy, ischemic:  EF 45-50% echo 03/17/14   Discharged Condition: stable  Hospital Course:   73 yo previously seen by DR Myrtie Neither 5/15 . Distant history of CABG in 1996 History of carotid disease and aortic stent graft followed by Dr Kellie Simmering. Very active Has walked 2 miles/dayfor years and mows his lawn. Has had chest pressure and dyspnea for about 8 days. Exertional. Does not go away quickly. No rest pain. Seen in ER 7/20 Some relief with albuterol Has some asthma/COPD but quit smoking in 96 No cough sputum or fever CXR in ER NAD. BNP not checked ECG with ? Anterolateral aneurysm no change from 2014 But suggestion of ST elevation in anterolateral leads. No recent evaluation of EF Compliant with meds and ASA.   The patient was brought in for right and left heart caths on March 18, 2014 which revealed severe three-vessel coronary artery disease with total occlusion of the RCA, total occlusion of the LAD, severe ulcerated stenosis of the distal left main, and severe diffuse stenosis of the OM left circumflex branches.  Status post CABG with continued patency of the saphenous vein graft to PDA, LIMA to LAD, and patent but severely diseased saphenous vein graft to diagonal. He underwent successful PCI of the distal left main with a drug-eluting stent at that time. Known mild LV dysfunction by echo, LVEF 45%. Dual antiplatelet therapy with aspirin and Plavix for at least 12 months. Staged PCI of the saphenous vein graft to diagonal next Wednesday. Will hold HCTZ starting Tuesday next week. BP and HR stable.  SCr improved. He monitors weight regularly.   The patient was brought back for  PTCA and stenting of the SVG-Diagonal(Xience).  This was completed without complications.  DAPT for 12 months.  The patient was seen by Dr. Martin[place] who felt he was stable for DC home.   Consults: None  Significant Diagnostic Studies:   CARDIAC CATH NOTE   Procedure: PTCA and stenting of the SVG-Diagonal  Indication: CCS Class 3 angina, crescendo symptoms. The patient underwent PCI of severe stenosis in the left main last week. He returns today for treatment of severe disease in the SVG-diagonal. The procedure is staged because of CKD.  Procedural Details: The left wrist was prepped, draped, and anesthetized with 1% lidocaine. Using the modified Seldinger technique, a 6 Fr sheath was introduced into the left radial artery. 3 mg verapamil was administered through the radial sheath. Weight-based bivalirudin was given for anticoagulation. Once a therapeutic ACT was achieved, a 6 [place] AL-1 guide catheter was inserted. A cougar coronary guidewire was used to cross the lesion. A 22m spider distal embolic protection device was deployed in the distal body of the graft after standard prep. The device was occlusive and there was no flow beyond the lesion after it was deployed. The lesion was predilated with a 2.5 mm balloon. The lesion was then stented with a 2.75x18 mm Xience Alpine DES stent deployed at 12 atm. Following PCI, there was 0% residual stenosis and TIMI-3 flow. Final angiography confirmed an excellent result. The patient tolerated the procedure well. There were no immediate procedural  complications. A TR band was used for radial hemostasis. The patient was transferred to the post catheterization recovery area for further monitoring.  Lesion Data:  Vessel: SVG-Diag/aortic anastomosis  Percent stenosis (pre): 95  TIMI-flow (pre): 3  Stent: 2.75x18 mm Xience DES  Percent stenosis (post): 0  TIMI-flow (post): 3    Conclusions: Successful PCI of the SVG-diagonal with a DES  Recommendations: DAPT x 12 months. Anticipate d/c tomorrow am.  Sherren Mocha  03/24/2014, 3:29 PM    Treatments:  See above  Discharge Exam: Blood pressure 126/57, pulse 71, temperature 97.6 F (36.4 C), temperature source Oral, resp. rate 20, height '5\' 10"'  (1.778 m), weight 195 lb 12.3 oz (88.8 kg), SpO2 95.00%.   Disposition: 01-Home or Self Care      Discharge Instructions   Diet - low sodium heart healthy    Complete by:  As directed      Discharge instructions    Complete by:  As directed   No lifting with left arm for three days.     Increase activity slowly    Complete by:  As directed             Medication List    STOP taking these medications       omeprazole 20 MG capsule  Commonly known as:  PRILOSEC  Replaced by:  pantoprazole 40 MG tablet      TAKE these medications       acetaminophen 500 MG tablet  Commonly known as:  TYLENOL  Take 1,000 mg by mouth every 6 (six) hours as needed for pain.     aspirin EC 81 MG tablet  Take 81 mg by mouth daily.     CENTRUM SILVER ADULT 50+ Tabs  Take 1 tablet by mouth daily.     clopidogrel 75 MG tablet  Commonly known as:  PLAVIX  Take 1 tablet (75 mg total) by mouth daily with breakfast.     enalapril 10 MG tablet  Commonly known as:  VASOTEC  Take 15 mg by mouth 2 (two) times daily.     hydrochlorothiazide 25 MG tablet  Commonly known as:  HYDRODIURIL  Take 0.5 tablets (12.5 mg total) by mouth daily.     Iron 28 MG Tabs  Take 28 mg by mouth daily.     metoprolol 50 MG tablet  Commonly known as:  LOPRESSOR  Take 50 mg by mouth 2 (two) times daily.     nitroGLYCERIN 0.4 MG SL tablet  Commonly known as:  NITROSTAT  Place 1 tablet (0.4 mg total) under the tongue every 5 (five) minutes as needed for chest pain.     pantoprazole 40 MG tablet  Commonly known as:  PROTONIX  Take 1 tablet (40 mg total) by mouth daily.     predniSONE  10 MG tablet  Commonly known as:  DELTASONE  Take 2 tablets (20 mg total) by mouth daily.     simvastatin 80 MG tablet  Commonly known as:  ZOCOR  Take 40 mg by mouth at bedtime.     VENTOLIN HFA 108 (90 BASE) MCG/ACT inhaler  Generic drug:  albuterol  INHALE 2 PUFFS BY MOUTH EVERY 4 TO 6 HOURS AS NEEDED FOR WHEEZING.       Follow-up Information   Follow up with Jory Sims, NP On 04/09/2014. (1:10 PM)    Specialty:  Nurse Practitioner   Contact information:   [address] [place] [address] [phone]  Greater than 30 minutes was spent completing the patient's discharge.   SignedTarri Fuller, PA-C 03/25/2014, 9:49 AM  Updated Med rec  Tarri Fuller, PA-C 03/25/2014 9:49 AM    Patient seen and examined and history reviewed. Agree with above findings and plan. No chest pain post procedure. Labs and Ecg stable. No hematoma at cath site. With need for Plavix will switch omeprazole to PPI.

## 2014-03-26 ENCOUNTER — Encounter: Payer: Self-pay | Admitting: Family Medicine

## 2014-04-09 ENCOUNTER — Encounter: Payer: Self-pay | Admitting: Adult Health

## 2014-04-09 ENCOUNTER — Ambulatory Visit (INDEPENDENT_AMBULATORY_CARE_PROVIDER_SITE_OTHER): Payer: Medicare Other | Admitting: Adult Health

## 2014-04-09 VITALS — BP 106/58 | HR 77 | Ht 70.0 in | Wt 198.0 lb

## 2014-04-09 DIAGNOSIS — I10 Essential (primary) hypertension: Secondary | ICD-10-CM

## 2014-04-09 DIAGNOSIS — I255 Ischemic cardiomyopathy: Secondary | ICD-10-CM

## 2014-04-09 DIAGNOSIS — I2589 Other forms of chronic ischemic heart disease: Secondary | ICD-10-CM

## 2014-04-09 MED ORDER — ENALAPRIL MALEATE 10 MG PO TABS: 10.0000 mg | ORAL_TABLET | Freq: Two times a day (BID) | ORAL | Status: DC

## 2014-04-09 NOTE — Assessment & Plan Note (Signed)
BP is low today. He takes his BP at home and has found to be low. Normally 130/80. I will decrease enalapril to 10 mg BID from 15 mg BID. He is to see his PCP, Dr. Calton Golds in a couple of weeks. If BP is still low, may need to D/C HCTZ.

## 2014-04-09 NOTE — Progress Notes (Deleted)
[name]    DOB: 07/29/1941  Age: 73 y.o.  MR#: [id number]       PCP:  Rubbie Battiest, MD      Insurance: Payor: [employer] MEDICARE / Plan: AARP MEDICARE COMPLETE / Product Type: *No Product type* /   CC:    Chief Complaint  Patient presents with  . Coronary Artery Disease  . Hypertension    VS Filed Vitals:   04/09/14 1300  BP: 106/58  Pulse: 77  Height: 5\' 10"  (1.778 m)  Weight: 198 lb (89.812 kg)    Weights Current Weight  04/09/14 198 lb (89.812 kg)  03/25/14 195 lb 12.3 oz (88.8 kg)  03/25/14 195 lb 12.3 oz (88.8 kg)    Blood Pressure  BP Readings from Last 3 Encounters:  04/09/14 106/58  03/25/14 126/57  03/25/14 126/57     Admit date:  (Not on file) Last encounter with RMR:  Visit date not found   Allergy Neomycin  Current Outpatient Prescriptions  Medication Sig Dispense Refill  . acetaminophen (TYLENOL) 500 MG tablet Take 1,000 mg by mouth every 6 (six) hours as needed for pain.      Marland Kitchen aspirin EC 81 MG tablet Take 81 mg by mouth daily.      . clopidogrel (PLAVIX) 75 MG tablet Take 1 tablet (75 mg total) by mouth daily with breakfast.  30 tablet  11  . enalapril (VASOTEC) 10 MG tablet Take 15 mg by mouth 2 (two) times daily.      . Ferrous Sulfate (IRON) 28 MG TABS Take 28 mg by mouth daily.      . hydrochlorothiazide (HYDRODIURIL) 25 MG tablet Take 0.5 tablets (12.5 mg total) by mouth daily.      . metoprolol (LOPRESSOR) 50 MG tablet Take 50 mg by mouth 2 (two) times daily.       . Multiple Vitamins-Minerals (CENTRUM SILVER ADULT 50+) TABS Take 1 tablet by mouth daily.       . nitroGLYCERIN (NITROSTAT) 0.4 MG SL tablet Place 1 tablet (0.4 mg total) under the tongue every 5 (five) minutes as needed for chest pain.  25 tablet  4  . pantoprazole (PROTONIX) 40 MG tablet Take 1 tablet (40 mg total) by mouth daily.  30 tablet  5  . simvastatin (ZOCOR) 80 MG tablet Take 40 mg by mouth at bedtime.      . VENTOLIN HFA 108 (90 BASE) MCG/ACT inhaler  INHALE 2 PUFFS BY MOUTH EVERY 4 TO 6 HOURS AS NEEDED FOR WHEEZING.  18 g  5   No current facility-administered medications for this visit.    Discontinued Meds:    Medications Discontinued During This Encounter  Medication Reason  . predniSONE (DELTASONE) 10 MG tablet Error    Patient Active Problem List   Diagnosis Date Noted  . Cardiomyopathy, ischemic:  EF 45-50% echo 03/17/14 03/25/2014  . Other and unspecified angina pectoris 03/18/2014  . 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 Occlusion and stenosis of carotid artery without mention of cerebral infarction 07/21/2013  . Acute blood loss anemia 02/10/2013  . CAD (coronary artery disease) 01/19/2013  . Abdominal aneurysm without mention of rupture 05/06/2012  . Cerebrovascular disease 12/18/2011  . GERD (gastroesophageal reflux disease)   . Arteriosclerotic cardiovascular disease (ASCVD) 12/06/2009  . HYPERLIPIDEMIA 12/05/2009  . HYPERTENSION 12/05/2009    LABS    Component Value Date/Time   NA 137 03/25/2014 0448   NA 140 03/24/2014 1100   NA 135* 03/19/2014  0257   K 5.2 03/25/2014 0448   K 4.7 03/24/2014 1100   K 4.9 03/19/2014 0257   CL 101 03/25/2014 0448   CL 99 03/24/2014 1100   CL 100 03/19/2014 0257   CO2 27 03/25/2014 0448   CO2 30 03/24/2014 1100   CO2 24 03/19/2014 0257   GLUCOSE 89 03/25/2014 0448   GLUCOSE 84 03/24/2014 1100   GLUCOSE 122* 03/19/2014 0257   BUN 21 03/25/2014 0448   BUN 20 03/24/2014 1100   BUN 26* 03/19/2014 0257   CREATININE 1.51* 03/25/2014 0448   CREATININE 1.55* 03/24/2014 1100   CREATININE 1.35 03/19/2014 0257   CREATININE 1.47* 01/13/2014 0826   CREATININE 1.48* 06/24/2013 0801   CREATININE 1.69* 01/16/2013 0720   CALCIUM 8.6 03/25/2014 0448   CALCIUM 8.8 03/24/2014 1100   CALCIUM 8.3* 03/19/2014 0257   GFRNONAA 44* 03/25/2014 0448   GFRNONAA 42* 03/24/2014 1100   GFRNONAA 50* 03/19/2014 0257   GFRAA 51* 03/25/2014 0448   GFRAA 49* 03/24/2014 1100   GFRAA 58* 03/19/2014 0257   CMP      Component Value Date/Time   NA 137 03/25/2014 0448   K 5.2 03/25/2014 0448   CL 101 03/25/2014 0448   CO2 27 03/25/2014 0448   GLUCOSE 89 03/25/2014 0448   BUN 21 03/25/2014 0448   CREATININE 1.51* 03/25/2014 0448   CREATININE 1.47* 01/13/2014 0826   CALCIUM 8.6 03/25/2014 0448   PROT 7.8 03/15/2014 1144   ALBUMIN 4.0 03/15/2014 1144   AST 21 03/15/2014 1144   ALT 16 03/15/2014 1144   ALKPHOS 86 03/15/2014 1144   BILITOT 0.7 03/15/2014 1144   GFRNONAA 44* 03/25/2014 0448   GFRAA 51* 03/25/2014 0448       Component Value Date/Time   WBC 11.7* 03/25/2014 0448   WBC 11.5* 03/24/2014 1100   WBC 9.1 03/19/2014 0257   HGB 12.5* 03/25/2014 0448   HGB 13.4 03/24/2014 1100   HGB 12.8* 03/19/2014 0257   HCT 39.0 03/25/2014 0448   HCT 41.0 03/24/2014 1100   HCT 39.6 03/19/2014 0257   MCV 90.9 03/25/2014 0448   MCV 91.3 03/24/2014 1100   MCV 92.1 03/19/2014 0257    Lipid Panel     Component Value Date/Time   CHOL 116 06/24/2013 0801   TRIG 152* 06/24/2013 0801   HDL 32* 06/24/2013 0801   CHOLHDL 3.6 06/24/2013 0801   VLDL 30 06/24/2013 0801   LDLCALC 54 06/24/2013 0801    ABG    Component Value Date/Time   PHART 7.345* 03/18/2014 1106   PCO2ART 54.0* 03/18/2014 1106   PO2ART 119.0* 03/18/2014 1106   HCO3 29.4* 03/18/2014 1106   HCO3 29.6* 03/18/2014 1106   TCO2 31 03/18/2014 1106   TCO2 31 03/18/2014 1106   O2SAT 98.0 03/18/2014 1106   O2SAT 74.0 03/18/2014 1106     No results found for this basename: TSH   BNP (last 3 results) No results found for this basename: PROBNP,  in the last 8760 hours Cardiac Panel (last 3 results) No results found for this basename: CKTOTAL, CKMB, TROPONINI, RELINDX,  in the last 72 hours  Iron/TIBC/Ferritin/ %Sat No results found for this basename: iron, tibc, ferritin, ironpctsat     EKG Orders placed in visit on 03/26/14  . EKG 12-LEAD     Prior Assessment and Plan Problem List as of 04/09/2014     Cardiovascular and Mediastinum   HYPERTENSION   Last  Assessment & Plan   03/17/2014 Office  Visit Written 03/17/2014  9:22 AM by Josue Hector, MD     Well controlled.  Continue current medications and low sodium Dash type diet.       Arteriosclerotic cardiovascular disease (ASCVD)   Last Assessment & Plan   12/04/2012 Office Visit Edited 12/13/2012  7:24 PM by Yehuda Savannah, MD     Patient remains asymptomatic from the standpoint of coronary artery disease. We will continue to maximally treat cardiovascular risk factors.    Cerebrovascular disease   Last Assessment & Plan   12/04/2012 Office Visit Written 12/04/2012  1:37 PM by Yehuda Savannah, MD     Moderate left ICA stenosis identified one year ago; followup examination will be obtained. Patient remains free of neurologic symptoms.    Abdominal aneurysm without mention of rupture   Last Assessment & Plan   03/17/2014 Office Visit Written 03/17/2014  9:28 AM by Josue Hector, MD     F/u Dr Kellie Simmering Due for [place] / CTA surveillance no palpable mass    CAD (coronary artery disease)   Last Assessment & Plan   03/17/2014 Office Visit Written 03/17/2014  9:25 AM by Josue Hector, MD     73 yo bypass grafts with likely unstable anginal equivalent  SL nitro called in  Will arrange right and left cath with LV and grafts with DR Burt Knack tomorrow Risks discussed patient willing to proceed.  Labs and  CXR done 7/20  CR 1.6  Will have him hold his HCTZ and het NS for a couple of hours prior to cath    Carotid artery dissection   Last Assessment & Plan   03/17/2014 Office Visit Written 03/17/2014  9:29 AM by Josue Hector, MD     Moderate left ICA  F/u [facility] for duplex ASA     Occlusion and stenosis of carotid artery without mention of cerebral infarction   Other and unspecified angina pectoris   Cardiomyopathy, ischemic:  EF 45-50% echo 03/17/14     Digestive   GERD (gastroesophageal reflux disease)     Other   HYPERLIPIDEMIA   Last Assessment & Plan   03/17/2014 Office Visit Written 03/17/2014  9:25  AM by Josue Hector, MD      Cholesterol is at goal.  Continue current dose of statin and diet Rx.  No myalgias or side effects.  F/U  LFT's in 6 months. Lab Results  Component Value Date   LDLCALC 54 06/24/2013                Acute blood loss anemia       Imaging: Dg Chest 2 View  03/15/2014   CLINICAL DATA:  Chest pressure for 6 days.  Weakness.  EXAM: CHEST  2 VIEW  COMPARISON:  01/19/2013  FINDINGS: Mild hyperinflation. Prior median sternotomy. Moderate thoracic spondylosis. Midline trachea. Mild cardiomegaly. Atherosclerosis in the transverse aorta. Mediastinal contours otherwise within normal limits. No pleural effusion or pneumothorax. Mild biapical pleural thickening. No congestive failure. Clear lungs.  IMPRESSION: Mild cardiomegaly and aortic atherosclerosis.  No acute findings.   Electronically Signed   By: Abigail Miyamoto M.D.   On: 03/15/2014 11:24

## 2014-04-09 NOTE — Assessment & Plan Note (Signed)
He is doing very well. He is active, walking 2 miles every day. He denies chest pain, breathing better and has more energy. Continue DAPT for one year. He will be seen again in 6 months unless symptomatic.

## 2014-04-09 NOTE — Patient Instructions (Addendum)
Your physician wants you to follow-up in: 6 months You will receive a reminder letter in the mail two months in advance. If you don't receive a letter, please call our office to schedule the follow-up appointment.    Your physician has recommended you make the following change in your medication:    DECREASE Enalapril to 10 mg twice a day      Thank you for choosing [facility] !

## 2014-04-09 NOTE — Progress Notes (Signed)
HPI: [name] is a 73 year old patient of Dr.Koneswaran,  we are following for ongoing assessment and management of coronary artery disease with distant history of CABG in 1996, carotid disease and aortic stent graft followed by  Dr. Kellie Simmering.   The patient was recently admitted to [facility] after plan diagnostic write left heart catheterization, revealing severe three-vessel coronary artery disease and total occlusion of the RCA, total occlusion of the LAD, severe ulcerative stenosis of the distal left main, and severe diffuse stenosis of the OM left circumflex branches. He continued patency of the saphenous vein graft to his PDA LIMA to LAD and patent but severely diseased saphenous vein graft to the diagonal.  The patient underwent PCI of the distal left main with drug-eluting stent. A staged PCI of the saphenous vein graft to the diagonal. The patient was recommended for dual antiplatelet therapy for one year on Plavix and aspirin. He was continuing metoprolol HCTZ . He is here for followup post hospitalization follow up.   He is without complaints. He is now back to walking 2 miles every day. He is planning to play golf tomorrow. No recurrent chest pain.   Allergies  Allergen Reactions  . Neomycin     Current Outpatient Prescriptions  Medication Sig Dispense Refill  . acetaminophen (TYLENOL) 500 MG tablet Take 1,000 mg by mouth every 6 (six) hours as needed for pain.      Marland Kitchen aspirin EC 81 MG tablet Take 81 mg by mouth daily.      . clopidogrel (PLAVIX) 75 MG tablet Take 1 tablet (75 mg total) by mouth daily with breakfast.  30 tablet  11  . enalapril (VASOTEC) 10 MG tablet Take 15 mg by mouth 2 (two) times daily.      . Ferrous Sulfate (IRON) 28 MG TABS Take 28 mg by mouth daily.      . hydrochlorothiazide (HYDRODIURIL) 25 MG tablet Take 0.5 tablets (12.5 mg total) by mouth daily.      . metoprolol (LOPRESSOR) 50 MG tablet Take 50 mg by mouth 2 (two) times daily.       .  Multiple Vitamins-Minerals (CENTRUM SILVER ADULT 50+) TABS Take 1 tablet by mouth daily.       . nitroGLYCERIN (NITROSTAT) 0.4 MG SL tablet Place 1 tablet (0.4 mg total) under the tongue every 5 (five) minutes as needed for chest pain.  25 tablet  4  . pantoprazole (PROTONIX) 40 MG tablet Take 1 tablet (40 mg total) by mouth daily.  30 tablet  5  . simvastatin (ZOCOR) 80 MG tablet Take 40 mg by mouth at bedtime.      . VENTOLIN HFA 108 (90 BASE) MCG/ACT inhaler INHALE 2 PUFFS BY MOUTH EVERY 4 TO 6 HOURS AS NEEDED FOR WHEEZING.  18 g  5   No current facility-administered medications for this visit.    Past Medical History  Diagnosis Date  . 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 AAA (abdominal aortic aneurysm) 2010    4.4 cm 08/2008;4.44 in 7/10 and 4.65 in 08/2009; 4.8 by CT in 11/2009; 4.3 by ultrasound in 08/2010  . Hypertension   . Hyperlipidemia   . Tobacco abuse, in remission     40 pack year total consumption; discontinued in 1996  . GERD (gastroesophageal reflux disease)   . Right bundle branch block   . Diverticulosis   . Colonic polyp 2002    polypectomy in 2002  . COPD (  chronic obstructive pulmonary disease)   . CAD (coronary artery disease)     03/18/14:  PCI with DES to distal left main. 7/29: DES to the SVG to Diag  . ED (erectile dysfunction)   . IFG (impaired fasting glucose)   . Chronic rhinitis   . Myocardial infarction     "told h/o silent MI sometime before 1996"  . Pneumonia ~ 2001; ~ 2005  . Chronic bronchitis   . Arthritis     "fingers" (03/18/2014)  . Cardiomyopathy, ischemic     Echo 03/17/14: EF 45-50%    Past Surgical History  Procedure Laterality Date  . Colonoscopy  2002    polypectomy-patient denies  . Laparoscopic cholecystectomy  12/2009  . Abdominal aortic endovascular stent graft N/A 12/11/2012    Procedure: ABDOMINAL AORTIC ENDOVASCULAR STENT GRAFT;  Surgeon: Mal Misty, MD;  Location: [place];  Service: Vascular;   Laterality: N/A;  Ultrasound guided; [place]  . Abdominal aortic aneurysm repair  11/2012  . Total hip arthroplasty Left 01/21/2013    Procedure: TOTAL HIP ARTHROPLASTY ANTERIOR APPROACH;  Surgeon: Mauri Pole, MD;  Location: [place];  Service: Orthopedics;  Laterality: Left;  . Joint replacement    . Coronary artery bypass graft  01/09/1995    "CABG X3"  . Cardiac catheterization  01/08/1995  . Coronary angioplasty with stent placement  03/18/2014    "1"  . Coronary angioplasty with stent placement  03/24/2014    "1"    ROS: Review of systems complete and found to be negative unless listed above s PHYSICAL EXAM BP 106/58  Pulse 77  Ht 5\' 10"  (1.778 m)  Wt 198 lb (89.812 kg)  BMI 28.41 kg/m2 General: Well developed, well nourished, in no acute distress Head: Eyes PERRLA, No xanthomas.   Normal cephalic and atramatic  Lungs: Clear bilaterally to auscultation and percussion. Heart: HRRR S1 S2, without MRG.  Pulses are 2+ & equal.            No carotid bruit. No JVD.  No abdominal bruits. No femoral bruits. Abdomen: Bowel sounds are positive, abdomen soft and non-tender without masses or                  Hernia's noted. Msk:  Back normal, normal gait. Normal strength and tone for age. Extremities: No clubbing, cyanosis or edema.  DP +1 Left wrist is free of bleeding or hematoma. Neuro: Alert and oriented X 3. Psych:  Good affect, responds appropriately  ASSESSMENT AND PLAN

## 2014-04-09 NOTE — Assessment & Plan Note (Signed)
Will need repeat echo in 6 months with EF documented at 45% during cath.

## 2014-04-12 ENCOUNTER — Encounter: Payer: Self-pay | Admitting: Family Medicine

## 2014-04-12 ENCOUNTER — Telehealth: Payer: Self-pay | Admitting: Family Medicine

## 2014-04-12 ENCOUNTER — Ambulatory Visit (INDEPENDENT_AMBULATORY_CARE_PROVIDER_SITE_OTHER): Payer: Medicare Other | Admitting: Family Medicine

## 2014-04-12 VITALS — BP 120/60 | Ht 70.5 in | Wt 198.0 lb

## 2014-04-12 DIAGNOSIS — I679 Cerebrovascular disease, unspecified: Secondary | ICD-10-CM

## 2014-04-12 DIAGNOSIS — E785 Hyperlipidemia, unspecified: Secondary | ICD-10-CM

## 2014-04-12 DIAGNOSIS — I209 Angina pectoris, unspecified: Secondary | ICD-10-CM

## 2014-04-12 DIAGNOSIS — I25119 Atherosclerotic heart disease of native coronary artery with unspecified angina pectoris: Secondary | ICD-10-CM

## 2014-04-12 DIAGNOSIS — I2510 Atherosclerotic heart disease of native coronary artery without angina pectoris: Secondary | ICD-10-CM

## 2014-04-12 NOTE — Telephone Encounter (Signed)
Patient said that he just got a heart stent put in and all of his medications have changed and before he goes any further, he wants to have a medication check with Dr. Richardson Landry either late this afternoon or tomorrow. Please advise because we are booked.

## 2014-04-12 NOTE — Progress Notes (Signed)
   Subjective:    Patient ID: Paul Sandoval, male    DOB: [birth year], 73 y.o.   MRN: [id number]  Hypertension This is a chronic problem. The current episode started more than 1 year ago. The problem has been gradually improving since onset. The problem is controlled. There are no associated agents to hypertension. There are no known risk factors for coronary artery disease. Treatments tried: enalapril, metoprolol. The current treatment provides significant improvement. There are no compliance problems.   Patient states he has no concerns at this time.  Patient recently had a intervention. Developed a high-grade stenosis. Had angioplasty intervention. Now is on an additional blood thinning agent.  Patient claims compliance with all medications. Including his lipid medicine. No obvious side effects with this.  Patient notes rare wheezing at times. He was advised to 1.2 uses albuterol multiple times per day. However on further history this was immediately prior to his intervention. No chronic shortness of breath.   Review of Systems No headache no chest pain no abdominal pain no change about habits no blood in stool ROS otherwise negative.    Objective:   Physical Exam Alert no apparent distress. HEENT normal. Lungs clear. Heart rare rhythm. Ankles without edema.       Assessment & Plan:  Impression 1 hypertension good control. #2 coronary artery disease discussed. #3 hyperlipidemia discussed. #4 reflux ongoing. #5 COPD with wheezing discussed. Plan exercise diet discussed in encourage. Maintain all same medications. Check every 6 months. WSL

## 2014-04-12 NOTE — Telephone Encounter (Addendum)
Office visit scheduled today

## 2014-04-12 NOTE — Telephone Encounter (Signed)
Let's do later this aft in same day

## 2014-06-10 ENCOUNTER — Ambulatory Visit: Payer: Medicare Other | Admitting: Family Medicine

## 2014-06-14 ENCOUNTER — Ambulatory Visit: Payer: Medicare Other | Admitting: Family Medicine

## 2014-06-15 ENCOUNTER — Ambulatory Visit (INDEPENDENT_AMBULATORY_CARE_PROVIDER_SITE_OTHER): Payer: Medicare Other | Admitting: *Deleted

## 2014-06-15 DIAGNOSIS — Z23 Encounter for immunization: Secondary | ICD-10-CM

## 2014-06-30 ENCOUNTER — Encounter: Payer: Self-pay | Admitting: Family Medicine

## 2014-06-30 ENCOUNTER — Ambulatory Visit (INDEPENDENT_AMBULATORY_CARE_PROVIDER_SITE_OTHER): Payer: Medicare Other | Admitting: Family Medicine

## 2014-06-30 VITALS — BP 130/80 | Ht 70.5 in | Wt 198.0 lb

## 2014-06-30 DIAGNOSIS — E785 Hyperlipidemia, unspecified: Secondary | ICD-10-CM

## 2014-06-30 DIAGNOSIS — Z79899 Other long term (current) drug therapy: Secondary | ICD-10-CM

## 2014-06-30 MED ORDER — ENALAPRIL MALEATE 10 MG PO TABS: ORAL_TABLET | ORAL | Status: DC

## 2014-06-30 NOTE — Progress Notes (Signed)
   Subjective:    Patient ID: Paul Sandoval, male    DOB: 1940/10/29, 73 y.o.   MRN: [id number]  HPI  He does not need refills on his meds.  He does c/o of congestion, cough, drainage. On further history patient has congestion drainage and cough for only 2 days. No fever nonproductive. No major headache  Compliant with blood pressure medicine. Numbers good when checked elsewhere. Next  Compliant with lipid medication. 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 Goes to the [place] [facility] tear has not had blood work for over 6 months. Next  Known history of coronary artery disease no exertional chest pain no shortness of breath   Review of Systems    no nausea no diarrhea no rash no change in bowel habits no headache Objective:   Physical Exam  Alert slight nasal congestion HEENT otherwise normal neck supple. Lungs clear heart regular rate and rhythm. Ankles without edema.      Assessment & Plan:  Impression 1 hypertension good control. #2 hyperlipidemia status uncertain. #3 coronary artery disease clinically silent #4 asthma stable #5 URI plan lipid and liverblood work. To continue same medicine.diet exercise discussed check every 6 months. WSL

## 2014-07-02 LAB — LIPID PANEL
CHOL/HDL RATIO: 4.4 ratio
Cholesterol: 120 mg/dL (ref 0–200)
HDL: 27 mg/dL — AB (ref >39)
LDL CALC: 48 mg/dL (ref 0–99)
TRIGLYCERIDES: 226 mg/dL — AB (ref <150)
VLDL: 45 mg/dL — AB (ref 0–40)

## 2014-07-02 LAB — HEPATIC FUNCTION PANEL
ALBUMIN: 4.3 g/dL (ref 3.5–5.2)
ALT: 11 U/L (ref 0–53)
AST: 20 U/L (ref 0–37)
Alkaline Phosphatase: 68 U/L (ref 39–117)
Bilirubin, Direct: 0.1 mg/dL (ref 0.0–0.3)
Indirect Bilirubin: 0.6 mg/dL (ref 0.2–1.2)
TOTAL PROTEIN: 6.8 g/dL (ref 6.0–8.3)
Total Bilirubin: 0.7 mg/dL (ref 0.2–1.2)

## 2014-07-04 ENCOUNTER — Encounter: Payer: Self-pay | Admitting: Family Medicine

## 2014-08-04 ENCOUNTER — Encounter: Payer: Self-pay | Admitting: Family Medicine

## 2014-08-04 ENCOUNTER — Ambulatory Visit (INDEPENDENT_AMBULATORY_CARE_PROVIDER_SITE_OTHER): Payer: Medicare Other | Admitting: Family Medicine

## 2014-08-04 VITALS — BP 124/68 | Temp 98.3°F | Ht 70.5 in | Wt 199.1 lb

## 2014-08-04 DIAGNOSIS — J310 Chronic rhinitis: Secondary | ICD-10-CM

## 2014-08-04 DIAGNOSIS — J329 Chronic sinusitis, unspecified: Secondary | ICD-10-CM

## 2014-08-04 MED ORDER — AMOXICILLIN-POT CLAVULANATE 875-125 MG PO TABS: 1.0000 | ORAL_TABLET | Freq: Two times a day (BID) | ORAL | Status: DC

## 2014-08-04 NOTE — Progress Notes (Signed)
   Subjective:    Patient ID: Paul Sandoval, male    DOB: [birth year], 73 y.o.   MRN: [id number]  URI  This is a new problem. The current episode started in the past 7 days. The problem has been unchanged. Maximum temperature: low grade fever. Associated symptoms include congestion, coughing, rhinorrhea and wheezing. Pertinent negatives include no chest pain or ear pain. He has tried acetaminophen (Robitussin DM) for the symptoms. The treatment provided no relief.   patient does have history of reactive airway Patient has no other concerns at this time.   Review of Systems  Constitutional: Negative for fever and activity change.  HENT: Positive for congestion and rhinorrhea. Negative for ear pain.   Eyes: Negative for discharge.  Respiratory: Positive for cough and wheezing.   Cardiovascular: Negative for chest pain.       Objective:   Physical Exam  Constitutional: He appears well-developed.  HENT:  Head: Normocephalic.  Mouth/Throat: Oropharynx is clear and moist. No oropharyngeal exudate.  Neck: Normal range of motion.  Cardiovascular: Normal rate, regular rhythm and normal heart sounds.   No murmur heard. Pulmonary/Chest: Effort normal and breath sounds normal. He has no wheezes.  Lymphadenopathy:    He has no cervical adenopathy.  Neurological: He exhibits normal muscle tone.  Skin: Skin is warm and dry.  Nursing note and vitals reviewed.  patient not respiratory distress Mild sinus tenderness      Assessment & Plan:  Viral syndrome Acute rhinosinusitis Wheezing with history of reactive airway No sign and pneumonia Warnings discussed.

## 2014-08-04 NOTE — Patient Instructions (Signed)
How to Use an Inhaler Proper inhaler technique is very important. Good technique ensures that the medicine reaches the lungs. Poor technique results in depositing the medicine on the tongue and back of the throat rather than in the airways. If you do not use the inhaler with good technique, the medicine will not help you. STEPS TO FOLLOW IF USING AN INHALER WITHOUT AN EXTENSION TUBE 1. Remove the cap from the inhaler. 2. If you are using the inhaler for the first time, you will need to prime it. Shake the inhaler for 5 seconds and release four puffs into the air, away from your face. Ask your health care provider or pharmacist if you have questions about priming your inhaler. 3. Shake the inhaler for 5 seconds before each breath in (inhalation). 4. Position the inhaler so that the top of the canister faces up. 5. Put your index finger on the top of the medicine canister. Your thumb supports the bottom of the inhaler. 6. Open your mouth. 7. Either place the inhaler between your teeth and place your lips tightly around the mouthpiece, or hold the inhaler 1-2 inches away from your open mouth. If you are unsure of which technique to use, ask your health care provider. 8. Breathe out (exhale) normally and as completely as possible. 9. Press the canister down with your index finger to release the medicine. 10. At the same time as the canister is pressed, inhale deeply and slowly until your lungs are completely filled. This should take 4-6 seconds. Keep your tongue down. 11. Hold the medicine in your lungs for 5-10 seconds (10 seconds is best). This helps the medicine get into the small airways of your lungs. 12. Breathe out slowly, through pursed lips. Whistling is an example of pursed lips. 13. Wait at least 15-30 seconds between puffs. Continue with the above steps until you have taken the number of puffs your health care provider has ordered. Do not use the inhaler more than your health care provider  tells you. 14. Replace the cap on the inhaler. 15. Follow the directions from your health care provider or the inhaler insert for cleaning the inhaler. STEPS TO FOLLOW IF USING AN INHALER WITH AN EXTENSION (SPACER) 1. Remove the cap from the inhaler. 2. If you are using the inhaler for the first time, you will need to prime it. Shake the inhaler for 5 seconds and release four puffs into the air, away from your face. Ask your health care provider or pharmacist if you have questions about priming your inhaler. 3. Shake the inhaler for 5 seconds before each breath in (inhalation). 4. Place the open end of the spacer onto the mouthpiece of the inhaler. 5. Position the inhaler so that the top of the canister faces up and the spacer mouthpiece faces you. 6. Put your index finger on the top of the medicine canister. Your thumb supports the bottom of the inhaler and the spacer. 7. Breathe out (exhale) normally and as completely as possible. 8. Immediately after exhaling, place the spacer between your teeth and into your mouth. Close your lips tightly around the spacer. 9. Press the canister down with your index finger to release the medicine. 10. At the same time as the canister is pressed, inhale deeply and slowly until your lungs are completely filled. This should take 4-6 seconds. Keep your tongue down and out of the way. 11. Hold the medicine in your lungs for 5-10 seconds (10 seconds is best). This helps the  medicine get into the small airways of your lungs. Exhale. 12. Repeat inhaling deeply through the spacer mouthpiece. Again hold that breath for up to 10 seconds (10 seconds is best). Exhale slowly. If it is difficult to take this second deep breath through the spacer, breathe normally several times through the spacer. Remove the spacer from your mouth. 13. Wait at least 15-30 seconds between puffs. Continue with the above steps until you have taken the number of puffs your health care provider has  ordered. Do not use the inhaler more than your health care provider tells you. 14. Remove the spacer from the inhaler, and place the cap on the inhaler. 15. Follow the directions from your health care provider or the inhaler insert for cleaning the inhaler and spacer. If you are using different kinds of inhalers, use your quick relief medicine to open the airways 10-15 minutes before using a steroid if instructed to do so by your health care provider. If you are unsure which inhalers to use and the order of using them, ask your health care provider, nurse, or respiratory therapist. If you are using a steroid inhaler, always rinse your mouth with water after your last puff, then gargle and spit out the water. Do not swallow the water. AVOID:  Inhaling before or after starting the spray of medicine. It takes practice to coordinate your breathing with triggering the spray.  Inhaling through the nose (rather than the mouth) when triggering the spray. HOW TO DETERMINE IF YOUR INHALER IS FULL OR NEARLY EMPTY You cannot know when an inhaler is empty by shaking it. A few inhalers are now being made with dose counters. Ask your health care provider for a prescription that has a dose counter if you feel you need that extra help. If your inhaler does not have a counter, ask your health care provider to help you determine the date you need to refill your inhaler. Write the refill date on a calendar or your inhaler canister. Refill your inhaler 7-10 days before it runs out. Be sure to keep an adequate supply of medicine. This includes making sure it is not expired, and that you have a spare inhaler.  SEEK MEDICAL CARE IF:   Your symptoms are only partially relieved with your inhaler.  You are having trouble using your inhaler.  You have some increase in phlegm. SEEK IMMEDIATE MEDICAL CARE IF:   You feel little or no relief with your inhalers. You are still wheezing and are feeling shortness of breath or  tightness in your chest or both.  You have dizziness, headaches, or a fast heart rate.  You have chills, fever, or night sweats.  You have a noticeable increase in phlegm production, or there is blood in the phlegm. MAKE SURE YOU:   Understand these instructions.  Will watch your condition.  Will get help right away if you are not doing well or get worse. Document Released: 08/10/2000 Document Revised: 06/03/2013 Document Reviewed: 03/12/2013 [facility] Patient Information 2015 [place], [place]. This information is not intended to replace advice given to you by your health care provider. Make sure you discuss any questions you have with your health care provider.

## 2014-08-05 ENCOUNTER — Encounter (HOSPITAL_COMMUNITY): Payer: Self-pay | Admitting: Cardiovascular Disease

## 2014-08-18 ENCOUNTER — Encounter: Payer: Self-pay | Admitting: Family Medicine

## 2014-08-18 ENCOUNTER — Ambulatory Visit (INDEPENDENT_AMBULATORY_CARE_PROVIDER_SITE_OTHER): Payer: Medicare Other | Admitting: Family Medicine

## 2014-08-18 VITALS — BP 130/70 | Ht 70.75 in | Wt 199.0 lb

## 2014-08-18 DIAGNOSIS — Z125 Encounter for screening for malignant neoplasm of prostate: Secondary | ICD-10-CM

## 2014-08-18 DIAGNOSIS — Z0000 Encounter for general adult medical examination without abnormal findings: Secondary | ICD-10-CM

## 2014-08-18 DIAGNOSIS — Z23 Encounter for immunization: Secondary | ICD-10-CM

## 2014-08-18 NOTE — Progress Notes (Signed)
Subjective:    Patient ID: Paul Sandoval, male    DOB: Jun 06, 1941, 73 y.o.   MRN: [id number]  HPI AWV- Annual Wellness Visit  The patient was seen for their annual wellness visit. The patient's past medical history, surgical history, and family history were reviewed. Pertinent vaccines were reviewed ( tetanus, pneumonia, shingles, flu) The patient's medication list was reviewed and updated.  The height and weight were entered. The patient's current BMI is: 27.95  Cognitive screening was completed. Outcome of Mini - Cog: passed  Falls within the past 6 months:none  Current tobacco usage: non-smoker (All patients who use tobacco were given written and verbal information on quitting)  Recent listing of emergency department/hospitalizations over the past year were reviewed.  current specialist the patient sees on a regular basis: Dr. Kellie Simmering   Medicare annual wellness visit patient questionnaire was reviewed.  A written screening schedule for the patient for the next 5-10 years was given. Appropriate discussion of followup regarding next visit was discussed.  Patient states that he has no concerns at this time.   Results for orders placed or performed in visit on 06/30/14  Lipid panel  Result Value Ref Range   Cholesterol 120 0 - 200 mg/dL   Triglycerides 226 (H) <150 mg/dL   HDL 27 (L) >39 mg/dL   Total CHOL/HDL Ratio 4.4 Ratio   VLDL 45 (H) 0 - 40 mg/dL   LDL Cholesterol 48 0 - 99 mg/dL  Hepatic function panel  Result Value Ref Range   Total Bilirubin 0.7 0.2 - 1.2 mg/dL   Bilirubin, Direct 0.1 0.0 - 0.3 mg/dL   Indirect Bilirubin 0.6 0.2 - 1.2 mg/dL   Alkaline Phosphatase 68 39 - 117 U/L   AST 20 0 - 37 U/L   ALT 11 0 - 53 U/L   Total Protein 6.8 6.0 - 8.3 g/dL   Albumin 4.3 3.5 - 5.2 g/dL   Colonoscopy due 2019    Review of Systems  Constitutional: Negative for fever, activity change and appetite change.  HENT: Negative for congestion and rhinorrhea.     Eyes: Negative for discharge.  Respiratory: Negative for cough and wheezing.   Cardiovascular: Negative for chest pain.  Gastrointestinal: Negative for vomiting, abdominal pain and blood in stool.  Genitourinary: Negative for frequency and difficulty urinating.  Musculoskeletal: Negative for neck pain.  Skin: Negative for rash.  Allergic/Immunologic: Negative for environmental allergies and food allergies.  Neurological: Negative for weakness and headaches.  Psychiatric/Behavioral: Negative for agitation.  All other systems reviewed and are negative.      Objective:   Physical Exam  Constitutional: He appears well-developed and well-nourished.  HENT:  Head: Normocephalic and atraumatic.  Right Ear: External ear normal.  Left Ear: External ear normal.  Nose: Nose normal.  Mouth/Throat: Oropharynx is clear and moist.  Eyes: EOM are normal. Pupils are equal, round, and reactive to light.  Neck: Normal range of motion. Neck supple. No thyromegaly present.  Cardiovascular: Normal rate, regular rhythm and normal heart sounds.   No murmur heard. Pulmonary/Chest: Effort normal and breath sounds normal. No respiratory distress. He has no wheezes.  Abdominal: Soft. Bowel sounds are normal. He exhibits no distension and no mass. There is no tenderness.  Genitourinary: Penis normal.  Musculoskeletal: Normal range of motion. He exhibits no edema.  Lymphadenopathy:    He has no cervical adenopathy.  Neurological: He is alert. He exhibits normal muscle tone.  Skin: Skin is warm and dry. No erythema.  Psychiatric: He has a normal mood and affect. His behavior is normal. Judgment normal.  Vitals reviewed.         Assessment & Plan:  Impression wellness exam plan up-to-date on colonoscopy Heme cards. Appropriate Bw. prevnar injection today. Diet exercise discussed. Check in 6 months. WSL

## 2014-08-19 LAB — BASIC METABOLIC PANEL
BUN: 17 mg/dL (ref 6–23)
CO2: 31 meq/L (ref 19–32)
Calcium: 9 mg/dL (ref 8.4–10.5)
Chloride: 101 mEq/L (ref 96–112)
Creat: 1.63 mg/dL — ABNORMAL HIGH (ref 0.50–1.35)
Glucose, Bld: 93 mg/dL (ref 70–99)
POTASSIUM: 4.7 meq/L (ref 3.5–5.3)
Sodium: 139 mEq/L (ref 135–145)

## 2014-08-19 LAB — PSA: PSA: 1.04 ng/mL (ref <=4.00)

## 2014-08-25 ENCOUNTER — Telehealth: Payer: Self-pay | Admitting: *Deleted

## 2014-08-25 ENCOUNTER — Emergency Department (HOSPITAL_COMMUNITY): Payer: Medicare Other

## 2014-08-25 ENCOUNTER — Emergency Department (HOSPITAL_COMMUNITY)
Admission: EM | Admit: 2014-08-25 | Discharge: 2014-08-25 | Disposition: A | Discharge status: Home / Self Care | Payer: Medicare Other | Attending: Emergency Medicine | Admitting: Emergency Medicine

## 2014-08-25 ENCOUNTER — Encounter (HOSPITAL_COMMUNITY): Payer: Self-pay | Admitting: Adult Health

## 2014-08-25 ENCOUNTER — Encounter: Payer: Self-pay | Admitting: Family Medicine

## 2014-08-25 DIAGNOSIS — Z79899 Other long term (current) drug therapy: Secondary | ICD-10-CM | POA: Insufficient documentation

## 2014-08-25 DIAGNOSIS — I10 Essential (primary) hypertension: Secondary | ICD-10-CM | POA: Insufficient documentation

## 2014-08-25 DIAGNOSIS — R1013 Epigastric pain: Secondary | ICD-10-CM | POA: Insufficient documentation

## 2014-08-25 DIAGNOSIS — Z7982 Long term (current) use of aspirin: Secondary | ICD-10-CM | POA: Diagnosis not present

## 2014-08-25 DIAGNOSIS — Z951 Presence of aortocoronary bypass graft: Secondary | ICD-10-CM | POA: Diagnosis not present

## 2014-08-25 DIAGNOSIS — Z8701 Personal history of pneumonia (recurrent): Secondary | ICD-10-CM | POA: Diagnosis not present

## 2014-08-25 DIAGNOSIS — Z87448 Personal history of other diseases of urinary system: Secondary | ICD-10-CM | POA: Diagnosis not present

## 2014-08-25 DIAGNOSIS — Z9089 Acquired absence of other organs: Secondary | ICD-10-CM | POA: Diagnosis not present

## 2014-08-25 DIAGNOSIS — M1388 Other specified arthritis, other site: Secondary | ICD-10-CM | POA: Insufficient documentation

## 2014-08-25 DIAGNOSIS — I252 Old myocardial infarction: Secondary | ICD-10-CM | POA: Insufficient documentation

## 2014-08-25 DIAGNOSIS — E785 Hyperlipidemia, unspecified: Secondary | ICD-10-CM | POA: Insufficient documentation

## 2014-08-25 DIAGNOSIS — Z87891 Personal history of nicotine dependence: Secondary | ICD-10-CM | POA: Insufficient documentation

## 2014-08-25 DIAGNOSIS — Z86010 Personal history of colonic polyps: Secondary | ICD-10-CM | POA: Diagnosis not present

## 2014-08-25 DIAGNOSIS — I2510 Atherosclerotic heart disease of native coronary artery without angina pectoris: Secondary | ICD-10-CM | POA: Insufficient documentation

## 2014-08-25 DIAGNOSIS — K219 Gastro-esophageal reflux disease without esophagitis: Secondary | ICD-10-CM | POA: Diagnosis not present

## 2014-08-25 DIAGNOSIS — R079 Chest pain, unspecified: Secondary | ICD-10-CM

## 2014-08-25 DIAGNOSIS — J449 Chronic obstructive pulmonary disease, unspecified: Secondary | ICD-10-CM | POA: Insufficient documentation

## 2014-08-25 DIAGNOSIS — Z7902 Long term (current) use of antithrombotics/antiplatelets: Secondary | ICD-10-CM | POA: Diagnosis not present

## 2014-08-25 DIAGNOSIS — Z9889 Other specified postprocedural states: Secondary | ICD-10-CM | POA: Insufficient documentation

## 2014-08-25 DIAGNOSIS — Z9861 Coronary angioplasty status: Secondary | ICD-10-CM | POA: Diagnosis not present

## 2014-08-25 LAB — CBC
HCT: 40 % (ref 39.0–52.0)
Hemoglobin: 13.4 g/dL (ref 13.0–17.0)
MCH: 29.6 pg (ref 26.0–34.0)
MCHC: 33.5 g/dL (ref 30.0–36.0)
MCV: 88.5 fL (ref 78.0–100.0)
Platelets: 128 10*3/uL — ABNORMAL LOW (ref 150–400)
RBC: 4.52 MIL/uL (ref 4.22–5.81)
RDW: 13.1 % (ref 11.5–15.5)
WBC: 10.6 10*3/uL — ABNORMAL HIGH (ref 4.0–10.5)

## 2014-08-25 LAB — BASIC METABOLIC PANEL
Anion gap: 7 (ref 5–15)
BUN: 13 mg/dL (ref 6–23)
CO2: 31 mmol/L (ref 19–32)
Calcium: 9.4 mg/dL (ref 8.4–10.5)
Chloride: 95 mEq/L — ABNORMAL LOW (ref 96–112)
Creatinine, Ser: 1.48 mg/dL — ABNORMAL HIGH (ref 0.50–1.35)
GFR calc Af Amer: 52 mL/min — ABNORMAL LOW (ref >90)
GFR calc non Af Amer: 45 mL/min — ABNORMAL LOW (ref >90)
Glucose, Bld: 99 mg/dL (ref 70–99)
Potassium: 4.7 mmol/L (ref 3.5–5.1)
Sodium: 133 mmol/L — ABNORMAL LOW (ref 135–145)

## 2014-08-25 LAB — PROTIME-INR
INR: 1.02 (ref 0.00–1.49)
Prothrombin Time: 13.5 seconds (ref 11.6–15.2)

## 2014-08-25 LAB — I-STAT TROPONIN, ED: Troponin i, poc: 0.01 ng/mL (ref 0.00–0.08)

## 2014-08-25 MED ORDER — PANTOPRAZOLE SODIUM 40 MG PO TBEC: 40.0000 mg | DELAYED_RELEASE_TABLET | Freq: Two times a day (BID) | ORAL | Status: DC

## 2014-08-25 NOTE — ED Provider Notes (Signed)
CSN: [id number]     Arrival date & time 08/25/14  1618 History   First MD Initiated Contact with Patient 08/25/14 1756     Chief Complaint  Patient presents with  . Chest Pain     (Consider location/radiation/quality/duration/timing/severity/associated sxs/prior Treatment) Patient is a 73 y.o. male presenting with abdominal pain. The history is provided by the patient, the spouse and a relative (son).  Abdominal Pain Pain location:  Epigastric Pain quality: bloating and burning   Pain radiates to:  Does not radiate Pain severity:  Moderate Onset quality:  Gradual Duration:  3 hours Progression:  Resolved Chronicity:  Recurrent Context comment:  Had just eaten fried potatoes and layed down to go to sleep Relieved by: ant acids. Associated symptoms: no constipation, no diarrhea, no dysuria, no fever, no nausea, no shortness of breath, no sore throat and no vomiting     Past Medical History  Diagnosis Date  . 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 AAA (abdominal aortic aneurysm) 2010    4.4 cm 08/2008;4.44 in 7/10 and 4.65 in 08/2009; 4.8 by CT in 11/2009; 4.3 by ultrasound in 08/2010  . Hypertension   . Hyperlipidemia   . Tobacco abuse, in remission     40 pack year total consumption; discontinued in 1996  . GERD (gastroesophageal reflux disease)   . Right bundle branch block   . Diverticulosis   . Colonic polyp 2002    polypectomy in 2002  . COPD (chronic obstructive pulmonary disease)   . CAD (coronary artery disease)     03/18/14:  PCI with DES to distal left main. 7/29: DES to the SVG to Diag  . ED (erectile dysfunction)   . IFG (impaired fasting glucose)   . Chronic rhinitis   . Myocardial infarction     "told h/o silent MI sometime before 1996"  . Pneumonia ~ 2001; ~ 2005  . Chronic bronchitis   . Arthritis     "fingers" (03/18/2014)  . Cardiomyopathy, ischemic     Echo 03/17/14: EF 45-50%   Past Surgical History  Procedure  Laterality Date  . Colonoscopy  2002    polypectomy-patient denies  . Laparoscopic cholecystectomy  12/2009  . Abdominal aortic endovascular stent graft N/A 12/11/2012    Procedure: ABDOMINAL AORTIC ENDOVASCULAR STENT GRAFT;  Surgeon: Mal Misty, MD;  Location: [place];  Service: Vascular;  Laterality: N/A;  Ultrasound guided; Gore  . Abdominal aortic aneurysm repair  11/2012  . Total hip arthroplasty Left 01/21/2013    Procedure: TOTAL HIP ARTHROPLASTY ANTERIOR APPROACH;  Surgeon: Mauri Pole, MD;  Location: [place];  Service: Orthopedics;  Laterality: Left;  . Joint replacement    . Coronary artery bypass graft  01/09/1995    "CABG X3"  . Cardiac catheterization  01/08/1995  . Coronary angioplasty with stent placement  03/18/2014    "1"  . Coronary angioplasty with stent placement  03/24/2014    "1"  . Left and right heart catheterization with coronary/graft angiogram N/A 03/18/2014    Procedure: LEFT AND RIGHT HEART CATHETERIZATION WITH Beatrix Fetters;  Surgeon: Blane Ohara, MD;  Location: [facility] CATH LAB;  Service: Cardiovascular;  Laterality: N/A;  . Percutaneous coronary stent intervention (pci-s)  03/18/2014    Procedure: PERCUTANEOUS CORONARY STENT INTERVENTION (PCI-S);  Surgeon: Blane Ohara, MD;  Location: [facility] CATH LAB;  Service: Cardiovascular;;  . Percutaneous coronary stent intervention (pci-s) N/A 03/24/2014    Procedure: PERCUTANEOUS CORONARY STENT INTERVENTION (PCI-S);  Surgeon: Blane Ohara, MD;  Location: [facility] CATH LAB;  Service: Cardiovascular;  Laterality: N/A;   Family History  Problem Relation Age of Onset  . Colon cancer Neg Hx   . Colon polyps Neg Hx   . Heart disease Father   . Arthritis Mother   . Parkinsonism Mother   . Arthritis Sister     Brother with rheumatoid arthritis  . Cancer Father     Lung  . Hypertension Brother    History  Substance Use Topics  . Smoking status: Former Smoker -- 1.50 packs/day for 30 years    Types: Cigarettes     Quit date: 01/08/1995  . Smokeless tobacco: Never Used  . Alcohol Use: Yes     Comment: 03/18/2014 "no alacohol since 1996"    Review of Systems  Constitutional: Negative for fever, diaphoresis, activity change and appetite change.  HENT: Negative for facial swelling, sore throat, tinnitus, trouble swallowing and voice change.   Eyes: Negative for pain, redness and visual disturbance.  Respiratory: Negative for chest tightness, shortness of breath and wheezing.   Cardiovascular: Negative for palpitations and leg swelling.  Gastrointestinal: Positive for abdominal pain. Negative for nausea, vomiting, diarrhea, constipation and abdominal distention.  Endocrine: Negative.   Genitourinary: Negative.  Negative for dysuria, decreased urine volume, scrotal swelling and testicular pain.  Musculoskeletal: Negative for myalgias, back pain and gait problem.  Skin: Negative.  Negative for rash.  Neurological: Negative.  Negative for dizziness, tremors, weakness and headaches.  Psychiatric/Behavioral: Negative for suicidal ideas, hallucinations and self-injury. The patient is not nervous/anxious.       Allergies  Neomycin  Home Medications   Prior to Admission medications   Medication Sig Start Date End Date Taking? Authorizing Provider  acetaminophen (TYLENOL) 500 MG tablet Take 500 mg by mouth every 6 (six) hours as needed for mild pain.   Yes Historical Provider, MD  aspirin EC 81 MG tablet Take 81 mg by mouth daily.   Yes Historical Provider, MD  clopidogrel (PLAVIX) 75 MG tablet Take 1 tablet (75 mg total) by mouth daily with breakfast. 03/19/14  Yes Einar Pheasant Hager, PA-C  enalapril (VASOTEC) 10 MG tablet Take one and a half tablets PO BID Patient taking differently: Take 5 mg by mouth 2 (two) times daily.  06/30/14  Yes [name], MD  Ferrous Sulfate (IRON) 28 MG TABS Take 28 mg by mouth daily.   Yes Historical Provider, MD  hydrochlorothiazide (HYDRODIURIL) 25 MG tablet Take 0.5  tablets (12.5 mg total) by mouth daily. 03/19/14  Yes Brett Canales, PA-C  metoprolol (LOPRESSOR) 50 MG tablet Take 50 mg by mouth 2 (two) times daily.    Yes Historical Provider, MD  Multiple Vitamins-Minerals (CENTRUM SILVER ADULT 50+) TABS Take 1 tablet by mouth daily.    Yes Historical Provider, MD  nitroGLYCERIN (NITROSTAT) 0.4 MG SL tablet Place 1 tablet (0.4 mg total) under the tongue every 5 (five) minutes as needed for chest pain. 03/17/14  Yes Josue Hector, MD  simvastatin (ZOCOR) 80 MG tablet Take 40 mg by mouth at bedtime.   Yes Historical Provider, MD  VENTOLIN HFA 108 (90 BASE) MCG/ACT inhaler INHALE 2 PUFFS BY MOUTH EVERY 4 TO 6 HOURS AS NEEDED FOR WHEEZING.   Yes [name], MD  pantoprazole (PROTONIX) 40 MG tablet Take 1 tablet (40 mg total) by mouth 2 (two) times daily. 08/25/14   Margaretann Loveless, MD   BP 153/64 mmHg  Pulse 60  Temp(Src) 97.6 F (36.4 C) (Oral)  Resp 21  SpO2 95% Physical Exam  Constitutional: He is oriented to person, place, and time. He appears well-developed and well-nourished. No distress.  HENT:  Head: Normocephalic and atraumatic.  Right Ear: External ear normal.  Left Ear: External ear normal.  Nose: Nose normal.  Mouth/Throat: Oropharynx is clear and moist.  Eyes: Conjunctivae and EOM are normal. Pupils are equal, round, and reactive to light. No scleral icterus.  Neck: Normal range of motion. Neck supple. No JVD present. No tracheal deviation present. No thyromegaly present.  Cardiovascular: Normal rate and intact distal pulses.  Exam reveals no gallop and no friction rub.   No murmur heard. Pulmonary/Chest: Effort normal and breath sounds normal. No stridor. No respiratory distress. He has no wheezes. He has no rales.  Abdominal: Soft. He exhibits no distension. There is no tenderness. There is no rebound and no guarding.  Musculoskeletal: Normal range of motion. He exhibits no edema or tenderness.  Neurological: He is alert and oriented to  person, place, and time. No cranial nerve deficit. He exhibits normal muscle tone. Coordination normal.  5/5 strength in all 4 extremities. Normal Gait.   Skin: Skin is warm and dry. No rash noted. He is not diaphoretic.  Psychiatric: He has a normal mood and affect. His behavior is normal.  Nursing note and vitals reviewed.   ED Course  Procedures (including critical care time) Labs Review Labs Reviewed  CBC - Abnormal; Notable for the following:    WBC 10.6 (*)    Platelets 128 (*)    All other components within normal limits  BASIC METABOLIC PANEL - Abnormal; Notable for the following:    Sodium 133 (*)    Chloride 95 (*)    Creatinine, Ser 1.48 (*)    GFR calc non Af Amer 45 (*)    GFR calc Af Amer 52 (*)    All other components within normal limits  PROTIME-INR  Randolm Idol, ED    Imaging Review Dg Chest 2 View  08/25/2014   CLINICAL DATA:  Chest pain.  EXAM: CHEST  2 VIEW  COMPARISON:  None.  FINDINGS: Mediastinum and hilar structures normal. Prior CABG. Heart size normal. Lungs are clear. No pleural effusion or pneumothorax. Degenerative changes thoracic spine  IMPRESSION: 1. No acute cardiopulmonary disease. 2. Prior CABG.   Electronically Signed   By: Marcello Moores  Register   On: 08/25/2014 16:58     EKG Interpretation   Date/Time:  Wednesday August 25 2014 16:30:33 EST Ventricular Rate:  69 PR Interval:  150 QRS Duration: 150 QT Interval:  426 QTC Calculation: 456 R Axis:   120 Text Interpretation:  Normal sinus rhythm Right bundle branch block No  significant change since last tracing Confirmed by Wilson Singer  MD, STEPHEN  ([id number]) on 08/25/2014 7:31:25 PM      MDM   Final diagnoses:  Epigastric abdominal pain    The patient is a 73 y.o. M with hx of CAD s/p remote CABG and stents in July of this year and AAA repair in 2014 who presents for epigastric burning and burping last night after eating fried potatoes and resolved with antacids. The patient has been  completely asymptomatic today. EKG unchanged from previous, troponin negative, and mediastinum normal on chest xray. While patient is high risk given previous AAA and cardiac hx his symptoms feel much more likely related to his known GERD. Given the fact he has been asymptomatic for almost 24 hours  with normal labs and imaging I do not feel further workup with additional CT is indicated at this time given his creatinine of 1.48 and multiple prior and future scheduled CT's with contrast. The is discussed with the patient, his spouse, and his son who express agreement with this plan. The patient is discharged with PCP follow up and strict ED return precautions.   I estimate there is LOW risk for PERICARDIAL TAMPONADE, PNEUMOTHORAX, PULMONARY EMBOLISM, ACUTE CORONARY SYNDROME, OR THORACIC AORTIC DISSECTION, thus I consider the discharge disposition reasonable. We have discussed the diagnosis and risks, and we agree with discharging home to follow-up with their primary doctor. We also discussed returning to the Emergency Department immediately if new or worsening symptoms occur. We have discussed the symptoms which are most concerning (e.g., bloody sputum, fever, worsening pain or shortness of breath, vomiting) that necessitate immediate return.  Patient seen with attending, Dr. Wilson Singer, who oversaw clinical decision making.       Margaretann Loveless, MD 08/25/14 2156  Virgel Manifold, MD 08/26/14 [phone]

## 2014-08-25 NOTE — ED Notes (Signed)
MD at bedside. 

## 2014-08-25 NOTE — ED Notes (Signed)
Presents with episode of severe indigestion began last night and lasted on hour, today has been burping a lot. Recent cardiac stent placement in July and AAA surgery. Pt denies SOB and pan at this time.

## 2014-08-25 NOTE — Telephone Encounter (Signed)
Pt described dull pain in stomach and burping. Pt is 6 month f/u to be seen in February. Pt requested to be seen in January. Scheduled to see Dr. Bronson Ing on 09/03/14

## 2014-08-25 NOTE — Telephone Encounter (Signed)
PT has been having a dull pain during last night and had some burping. He is wanting seen tomorrow (we do not have a opening)

## 2014-08-26 ENCOUNTER — Other Ambulatory Visit: Payer: Self-pay | Admitting: *Deleted

## 2014-08-26 DIAGNOSIS — Z0000 Encounter for general adult medical examination without abnormal findings: Secondary | ICD-10-CM

## 2014-08-26 LAB — POC HEMOCCULT BLD/STL (HOME/3-CARD/SCREEN)
Card #2 Fecal Occult Blod, POC: NEGATIVE
FECAL OCCULT BLD: NEGATIVE
Fecal Occult Blood, POC: NEGATIVE

## 2014-09-01 ENCOUNTER — Encounter: Payer: Self-pay | Admitting: Family Medicine

## 2014-09-01 ENCOUNTER — Ambulatory Visit (INDEPENDENT_AMBULATORY_CARE_PROVIDER_SITE_OTHER): Payer: Medicare Other | Admitting: Family Medicine

## 2014-09-01 VITALS — BP 130/76 | Ht 70.75 in | Wt 194.8 lb

## 2014-09-01 DIAGNOSIS — K2990 Gastroduodenitis, unspecified, without bleeding: Secondary | ICD-10-CM

## 2014-09-01 DIAGNOSIS — K2970 Gastritis, unspecified, without bleeding: Secondary | ICD-10-CM | POA: Diagnosis not present

## 2014-09-01 NOTE — Progress Notes (Signed)
   Subjective:    Patient ID: Paul Sandoval, male    DOB: [birth year], 74 y.o.   MRN: [id number]  HPI Patient arrives for a follow up on ER visit for abdominal pain. Patient states he was diagnosed with gastritis and told to doubled his acid blocker.  Patient states overall abdominal pain is much better. Had a fairly thorough workup in the hospital due to his coronary artery disease history. ER report reviewed today and presents at patient   Review of Systems    no chest pain no headache no shortness breath no current abdominal pain no change in bowel habits Objective:   Physical Exam  Alert vitals stable. H&T normal. Lungs clear. Heart regular in rhythm. Abdomen benign.      Assessment & Plan:  Impression gastritis resolved discussed plan back off to one daily proton pump inhibitor after this initial month. Diet exercise discussed. Follow-up with as scheduled. WSL

## 2014-09-03 ENCOUNTER — Ambulatory Visit: Payer: Medicare Other | Admitting: Cardiovascular Disease

## 2014-09-24 ENCOUNTER — Encounter: Payer: Self-pay | Admitting: Cardiovascular Disease

## 2014-09-24 ENCOUNTER — Ambulatory Visit (INDEPENDENT_AMBULATORY_CARE_PROVIDER_SITE_OTHER): Payer: Medicare Other | Admitting: Cardiovascular Disease

## 2014-09-24 ENCOUNTER — Ambulatory Visit: Payer: Self-pay | Admitting: Cardiovascular Disease

## 2014-09-24 VITALS — BP 128/64 | HR 73 | Ht 70.0 in | Wt 194.0 lb

## 2014-09-24 DIAGNOSIS — R0989 Other specified symptoms and signs involving the circulatory and respiratory systems: Secondary | ICD-10-CM | POA: Diagnosis not present

## 2014-09-24 DIAGNOSIS — E785 Hyperlipidemia, unspecified: Secondary | ICD-10-CM

## 2014-09-24 DIAGNOSIS — I2510 Atherosclerotic heart disease of native coronary artery without angina pectoris: Secondary | ICD-10-CM | POA: Diagnosis not present

## 2014-09-24 DIAGNOSIS — I10 Essential (primary) hypertension: Secondary | ICD-10-CM

## 2014-09-24 DIAGNOSIS — I2583 Coronary atherosclerosis due to lipid rich plaque: Secondary | ICD-10-CM

## 2014-09-24 MED ORDER — NITROGLYCERIN 0.4 MG SL SUBL: 0.4000 mg | SUBLINGUAL_TABLET | SUBLINGUAL | Status: DC | PRN

## 2014-09-24 NOTE — Assessment & Plan Note (Signed)
Distant CABG 96  7/15 stenting SVG RCA and SVG diagonal.  Continue aspirin and plavix until 7/16  He would like to stop plavix at that time due to easy bruising Good symptom relief since revascularized  New nitro called in

## 2014-09-24 NOTE — Assessment & Plan Note (Signed)
Cholesterol is at goal.  Continue current dose of statin and diet Rx.  No myalgias or side effects.  F/U  LFT's in 6 months. Lab Results  Component Value Date   LDLCALC 48 06/30/2014

## 2014-09-24 NOTE — Progress Notes (Signed)
Patient ID: Paul Sandoval, male   DOB: [birth year], 74 y.o.   MRN: [id number]    HPI: Paul Sandoval is a 74 year old patient of Dr.Koneswaran,  we are following for ongoing assessment and management of coronary artery disease with distant history of CABG in 1996, carotid disease and aortic stent graft followed by  Dr. Kellie Simmering.   The patient was  admitted to [facility]  03/24/14 after plan diagnostic write left heart catheterization, revealing severe three-vessel coronary artery disease and total occlusion of the RCA, total occlusion of the LAD, severe ulcerative stenosis of the distal left main, and severe diffuse stenosis of the OM left circumflex branches. He continued patency of the saphenous vein graft to his PDA LIMA to LAD and patent but severely diseased saphenous vein graft to the diagonal.  The patient underwent PCI of the distal left main with drug-eluting stent. A staged PCI of the saphenous vein graft to the diagonal. The patient was recommended for dual antiplatelet therapy for one year on Plavix and aspirin. He was continuing metoprolol HCTZ .   He is without complaints. He is now back to walking 2 miles every day. He is planning to play golf tomorrow. No recurrent chest pain.   Allergies  Allergen Reactions  . Neomycin     Hives     Current Outpatient Prescriptions  Medication Sig Dispense Refill  . acetaminophen (TYLENOL) 500 MG tablet Take 500 mg by mouth every 6 (six) hours as needed for mild pain.    Marland Kitchen aspirin EC 81 MG tablet Take 81 mg by mouth daily.    . clopidogrel (PLAVIX) 75 MG tablet Take 1 tablet (75 mg total) by mouth daily with breakfast. 30 tablet 11  . enalapril (VASOTEC) 10 MG tablet Take one and a half tablets PO BID (Patient taking differently: Take 5 mg by mouth 2 (two) times daily. ) 90 tablet 5  . Ferrous Sulfate (IRON) 28 MG TABS Take 28 mg by mouth daily.    . hydrochlorothiazide (HYDRODIURIL) 25 MG tablet Take 0.5 tablets (12.5 mg total) by  mouth daily.    . metoprolol (LOPRESSOR) 50 MG tablet Take 50 mg by mouth 2 (two) times daily.     . Multiple Vitamins-Minerals (CENTRUM SILVER ADULT 50+) TABS Take 1 tablet by mouth daily.     . nitroGLYCERIN (NITROSTAT) 0.4 MG SL tablet Place 1 tablet (0.4 mg total) under the tongue every 5 (five) minutes as needed for chest pain. 25 tablet 4  . pantoprazole (PROTONIX) 40 MG tablet Take 1 tablet (40 mg total) by mouth 2 (two) times daily. 30 tablet 5  . simvastatin (ZOCOR) 80 MG tablet Take 40 mg by mouth at bedtime.    . VENTOLIN HFA 108 (90 BASE) MCG/ACT inhaler INHALE 2 PUFFS BY MOUTH EVERY 4 TO 6 HOURS AS NEEDED FOR WHEEZING. 18 g 5   No current facility-administered medications for this visit.    Past Medical History  Diagnosis Date  . 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 AAA (abdominal aortic aneurysm) 2010    4.4 cm 08/2008;4.44 in 7/10 and 4.65 in 08/2009; 4.8 by CT in 11/2009; 4.3 by ultrasound in 08/2010  . Hypertension   . Hyperlipidemia   . Tobacco abuse, in remission     40 pack year total consumption; discontinued in 1996  . GERD (gastroesophageal reflux disease)   . Right bundle branch block   . Diverticulosis   . Colonic polyp 2002  polypectomy in 2002  . COPD (chronic obstructive pulmonary disease)   . CAD (coronary artery disease)     03/18/14:  PCI with DES to distal left main. 7/29: DES to the SVG to Diag  . ED (erectile dysfunction)   . IFG (impaired fasting glucose)   . Chronic rhinitis   . Myocardial infarction     "told h/o silent MI sometime before 1996"  . Pneumonia ~ 2001; ~ 2005  . Chronic bronchitis   . Arthritis     "fingers" (03/18/2014)  . Cardiomyopathy, ischemic     Echo 03/17/14: EF 45-50%    Past Surgical History  Procedure Laterality Date  . Colonoscopy  2002    polypectomy-patient denies  . Laparoscopic cholecystectomy  12/2009  . Abdominal aortic endovascular stent graft N/A 12/11/2012    Procedure:  ABDOMINAL AORTIC ENDOVASCULAR STENT GRAFT;  Surgeon: Mal Misty, MD;  Location: [place];  Service: Vascular;  Laterality: N/A;  Ultrasound guided; Gore  . Abdominal aortic aneurysm repair  11/2012  . Total hip arthroplasty Left 01/21/2013    Procedure: TOTAL HIP ARTHROPLASTY ANTERIOR APPROACH;  Surgeon: Mauri Pole, MD;  Location: [place];  Service: Orthopedics;  Laterality: Left;  . Joint replacement    . Coronary artery bypass graft  01/09/1995    "CABG X3"  . Cardiac catheterization  01/08/1995  . Coronary angioplasty with stent placement  03/18/2014    "1"  . Coronary angioplasty with stent placement  03/24/2014    "1"  . Left and right heart catheterization with coronary/graft angiogram N/A 03/18/2014    Procedure: LEFT AND RIGHT HEART CATHETERIZATION WITH Beatrix Fetters;  Surgeon: Blane Ohara, MD;  Location: [facility] CATH LAB;  Service: Cardiovascular;  Laterality: N/A;  . Percutaneous coronary stent intervention (pci-s)  03/18/2014    Procedure: PERCUTANEOUS CORONARY STENT INTERVENTION (PCI-S);  Surgeon: Blane Ohara, MD;  Location: [facility] CATH LAB;  Service: Cardiovascular;;  . Percutaneous coronary stent intervention (pci-s) N/A 03/24/2014    Procedure: PERCUTANEOUS CORONARY STENT INTERVENTION (PCI-S);  Surgeon: Blane Ohara, MD;  Location: [facility] CATH LAB;  Service: Cardiovascular;  Laterality: N/A;    ROS: Review of systems complete and found to be negative unless listed above s PHYSICAL EXAM BP 128/64 mmHg  Pulse 73  Ht 5\' 10"  (1.778 m)  Wt 87.998 kg (194 lb)  BMI 27.84 kg/m2 General: Well developed, well nourished, in no acute distress Head: Eyes PERRLA, No xanthomas.   Normal cephalic and atramatic  Lungs: Clear bilaterally to auscultation and percussion. Heart: HRRR S1 S2, without MRG.  Pulses are 2+ & equal.            No carotid bruit. No JVD.  No abdominal bruits. No femoral bruits. Abdomen: Bowel sounds are positive, abdomen soft and non-tender without masses or                   Hernia's noted. Msk:  Back normal, normal gait. Normal strength and tone for age. Extremities: No clubbing, cyanosis or edema.  DP +1 Left wrist is free of bleeding or hematoma. Neuro: Alert and oriented X 3. Psych:  Good affect, responds appropriately  ASSESSMENT AND PLAN

## 2014-09-24 NOTE — Patient Instructions (Signed)
Your physician wants you to follow-up in:  6 MONTHS WITH DR NISHAN  You will receive a reminder letter in the mail two months in advance. If you don't receive a letter, please call our office to schedule the follow-up appointment. Your physician recommends that you continue on your current medications as directed. Please refer to the Current Medication list given to you today. 

## 2014-09-24 NOTE — Assessment & Plan Note (Signed)
Well controlled.  Continue current medications and low sodium Dash type diet.    

## 2014-09-24 NOTE — Assessment & Plan Note (Signed)
Known moderate disease f/u carotid duplex

## 2014-09-29 ENCOUNTER — Institutional Professional Consult (permissible substitution): Payer: Self-pay | Admitting: Cardiology

## 2014-10-12 ENCOUNTER — Encounter: Payer: Self-pay | Admitting: Family Medicine

## 2014-10-12 ENCOUNTER — Ambulatory Visit (INDEPENDENT_AMBULATORY_CARE_PROVIDER_SITE_OTHER): Payer: Medicare Other | Admitting: Family Medicine

## 2014-10-12 VITALS — BP 130/68 | Temp 98.1°F | Ht 70.75 in | Wt 195.0 lb

## 2014-10-12 DIAGNOSIS — J441 Chronic obstructive pulmonary disease with (acute) exacerbation: Secondary | ICD-10-CM

## 2014-10-12 DIAGNOSIS — J209 Acute bronchitis, unspecified: Secondary | ICD-10-CM

## 2014-10-12 MED ORDER — LEVOFLOXACIN 500 MG PO TABS: 500.0000 mg | ORAL_TABLET | Freq: Every day | ORAL | Status: AC

## 2014-10-12 MED ORDER — PREDNISONE 10 MG PO TABS: ORAL_TABLET | ORAL | Status: DC

## 2014-10-12 NOTE — Progress Notes (Signed)
   Subjective:    Patient ID: Paul Sandoval, male    DOB: Oct 18, 1940, 74 y.o.   MRN: [id number]  Sinusitis This is a new problem. The current episode started 1 to 4 weeks ago. The problem has been gradually worsening since onset. Maximum temperature: low grade fever. The pain is moderate. Associated symptoms include congestion and coughing. (Wheezing) Treatments tried: cough medicine, inhaler. The treatment provided no relief.   Patient states that he has no other concerns at this time.   Wheezing now sig worse  Coughing up yellow phlegm  Low gr cever  Energy level poor Appetite diminished  Review of Systems  HENT: Positive for congestion.   Respiratory: Positive for cough.        Objective:   Physical Exam  Alert moderate malaise. Vitals stable. HEENT moderate nasal congestion pharynx normal lungs bilateral wheezes rhonchi no tachypnea heart regular in rhythm      Assessment & Plan:  Impression rhinosinusitis/bronchitis with reactive airways plan Levaquin daily. Prednisone taper. Albuterol 4 times a day. WSL

## 2015-01-11 ENCOUNTER — Encounter: Payer: Self-pay | Admitting: Family Medicine

## 2015-01-11 ENCOUNTER — Ambulatory Visit (INDEPENDENT_AMBULATORY_CARE_PROVIDER_SITE_OTHER): Payer: Medicare Other | Admitting: Family Medicine

## 2015-01-11 VITALS — BP 132/70 | Temp 97.8°F | Ht 70.75 in | Wt 192.0 lb

## 2015-01-11 DIAGNOSIS — J441 Chronic obstructive pulmonary disease with (acute) exacerbation: Secondary | ICD-10-CM | POA: Diagnosis not present

## 2015-01-11 MED ORDER — LEVOFLOXACIN 500 MG PO TABS: 500.0000 mg | ORAL_TABLET | Freq: Every day | ORAL | Status: AC

## 2015-01-11 MED ORDER — OMEPRAZOLE 20 MG PO CPDR: 20.0000 mg | DELAYED_RELEASE_CAPSULE | Freq: Every day | ORAL | Status: DC

## 2015-01-11 NOTE — Progress Notes (Signed)
   Subjective:    Patient ID: Paul Sandoval, male    DOB: 1941/08/13, 74 y.o.   MRN: [id number]  HPI Patient here d/t productive cough, runny nose, and wheezing since 01/10/15. OTC med not effective.   No fever measured  Dim energy  Cough productive  Wheezing and cough prod    Review of Systems    no vomiting no diarrhea diminished energy Objective:   Physical Exam  Alert vital stable HEENT moderate his congestion frontal tenderness pharynx slight erythema lungs bronchial cough occasional wheezing heart rare rhythm      Assessment & Plan:  Impression exacerbation of COPD plan albuterol when necessary. Antibiotics prescribed. Symptom care discussed. WSL

## 2015-01-21 ENCOUNTER — Encounter: Payer: Self-pay | Admitting: Vascular Surgery

## 2015-01-25 ENCOUNTER — Ambulatory Visit (INDEPENDENT_AMBULATORY_CARE_PROVIDER_SITE_OTHER)
Admission: RE | Admit: 2015-01-25 | Discharge: 2015-01-25 | Disposition: A | Discharge status: Home / Self Care | Payer: Medicare Other | Source: Ambulatory Visit | Attending: Vascular Surgery | Admitting: Vascular Surgery

## 2015-01-25 ENCOUNTER — Encounter: Payer: Self-pay | Admitting: Vascular Surgery

## 2015-01-25 ENCOUNTER — Ambulatory Visit (INDEPENDENT_AMBULATORY_CARE_PROVIDER_SITE_OTHER): Payer: Medicare Other | Admitting: Vascular Surgery

## 2015-01-25 ENCOUNTER — Ambulatory Visit (HOSPITAL_COMMUNITY)
Admission: RE | Admit: 2015-01-25 | Discharge: 2015-01-25 | Disposition: A | Discharge status: Home / Self Care | Payer: Medicare Other | Source: Ambulatory Visit | Attending: Vascular Surgery | Admitting: Vascular Surgery

## 2015-01-25 VITALS — BP 130/74 | HR 58 | Temp 97.2°F | Resp 14 | Ht 70.0 in | Wt 191.0 lb

## 2015-01-25 DIAGNOSIS — I714 Abdominal aortic aneurysm, without rupture, unspecified: Secondary | ICD-10-CM

## 2015-01-25 DIAGNOSIS — Z48812 Encounter for surgical aftercare following surgery on the circulatory system: Secondary | ICD-10-CM | POA: Insufficient documentation

## 2015-01-25 DIAGNOSIS — I6523 Occlusion and stenosis of bilateral carotid arteries: Secondary | ICD-10-CM

## 2015-01-25 NOTE — Progress Notes (Signed)
Subjective:     Patient ID: Paul Sandoval, male   DOB: 1941-03-31, 74 y.o.   MRN: [id number]  HPI this 74 year old male returns for continued follow-up regarding his abdominal aortic aneurysm stent graft and known bilateral carotid occlusive disease which is asymptomatic. He denies any lateralizing weakness, aphasia, amaurosis fugax, diplopia, blurred vision, or syncope. He has had no abdominal or back pain. He did develop some dyspnea one year ago after I last saw him and had cardiac catheterization which required PTCA and stenting. He had previously undergone coronary artery bypass grafting by Dr. Cyndia Bent in 1996. He is now asymptomatic and ambulates several miles several days a week.  Past Medical History  Diagnosis Date  . 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 AAA (abdominal aortic aneurysm) 2010    4.4 cm 08/2008;4.44 in 7/10 and 4.65 in 08/2009; 4.8 by CT in 11/2009; 4.3 by ultrasound in 08/2010  . Hypertension   . Hyperlipidemia   . Tobacco abuse, in remission     40 pack year total consumption; discontinued in 1996  . GERD (gastroesophageal reflux disease)   . Right bundle branch block   . Diverticulosis   . Colonic polyp 2002    polypectomy in 2002  . COPD (chronic obstructive pulmonary disease)   . CAD (coronary artery disease)     03/18/14:  PCI with DES to distal left main. 7/29: DES to the SVG to Diag  . ED (erectile dysfunction)   . IFG (impaired fasting glucose)   . Chronic rhinitis   . Myocardial infarction     "told h/o silent MI sometime before 1996"  . Pneumonia ~ 2001; ~ 2005  . Chronic bronchitis   . Arthritis     "fingers" (03/18/2014)  . Cardiomyopathy, ischemic     Echo 03/17/14: EF 45-50%    History  Substance Use Topics  . Smoking status: Former Smoker -- 1.50 packs/day for 30 years    Types: Cigarettes    Start date: 12/01/1956    Quit date: 01/08/1995  . Smokeless tobacco: Never Used  . Alcohol Use: 0.0 oz/week    0  Standard drinks or equivalent per week     Comment: 03/18/2014 "no alacohol since 1996"    Family History  Problem Relation Age of Onset  . Colon cancer Neg Hx   . Colon polyps Neg Hx   . Heart disease Father   . Arthritis Mother   . Parkinsonism Mother   . Arthritis Sister     Brother with rheumatoid arthritis  . Cancer Father     Lung  . Hypertension Brother     Allergies  Allergen Reactions  . Neomycin     Hives      Current outpatient prescriptions:  .  acetaminophen (TYLENOL) 500 MG tablet, Take 500 mg by mouth every 6 (six) hours as needed for mild pain., Disp: , Rfl:  .  aspirin EC 81 MG tablet, Take 81 mg by mouth daily., Disp: , Rfl:  .  clopidogrel (PLAVIX) 75 MG tablet, Take 1 tablet (75 mg total) by mouth daily with breakfast., Disp: 30 tablet, Rfl: 11 .  enalapril (VASOTEC) 10 MG tablet, Take one and a half tablets PO BID (Patient taking differently: Take 5 mg by mouth 2 (two) times daily. ), Disp: 90 tablet, Rfl: 5 .  Ferrous Sulfate (IRON) 28 MG TABS, Take 28 mg by mouth daily., Disp: , Rfl:  .  hydrochlorothiazide (HYDRODIURIL) 25 MG tablet,  Take 0.5 tablets (12.5 mg total) by mouth daily., Disp: , Rfl:  .  metoprolol (LOPRESSOR) 50 MG tablet, Take 50 mg by mouth 2 (two) times daily. , Disp: , Rfl:  .  Multiple Vitamins-Minerals (CENTRUM SILVER ADULT 50+) TABS, Take 1 tablet by mouth daily. , Disp: , Rfl:  .  nitroGLYCERIN (NITROSTAT) 0.4 MG SL tablet, Place 1 tablet (0.4 mg total) under the tongue every 5 (five) minutes as needed for chest pain., Disp: 25 tablet, Rfl: 4 .  omeprazole (PRILOSEC) 20 MG capsule, Take 1 capsule (20 mg total) by mouth daily., Disp: 90 capsule, Rfl: 3 .  simvastatin (ZOCOR) 80 MG tablet, Take 40 mg by mouth at bedtime., Disp: , Rfl:  .  VENTOLIN HFA 108 (90 BASE) MCG/ACT inhaler, INHALE 2 PUFFS BY MOUTH EVERY 4 TO 6 HOURS AS NEEDED FOR WHEEZING., Disp: 18 g, Rfl: 5  Filed Vitals:   01/25/15 1028 01/25/15 1031  BP: 130/63 130/74   Pulse: 54 58  Temp: 97.2 F (36.2 C)   TempSrc: Oral   Resp: 14   Height: '5\' 10"'$  (1.778 m)   Weight: 191 lb (86.637 kg)   SpO2: 98%     Body mass index is 27.41 kg/(m^2).           Review of Systems denies chest pain, dyspnea on exertion, PND, orthopnea, hemoptysis,   claudication. All systems negative and a complete review of systems other than cardiac which was addressed in history of present illness Objective:   Physical Exam BP 130/74 mmHg  Pulse 58  Temp(Src) 97.2 F (36.2 C) (Oral)  Resp 14  Ht '5\' 10"'$  (1.778 m)  Wt 191 lb (86.637 kg)  BMI 27.41 kg/m2  SpO2 98%  Gen.-alert and oriented x3 in no apparent distress HEENT normal for age Lungs no rhonchi or wheezing Cardiovascular regular rhythm no murmurs carotid pulses 3+ palpable no bruits audible Abdomen soft nontender no palpable masses Musculoskeletal free of  major deformities Skin clear -no rashes Neurologic normal Lower extremities 3+ femoral and dorsalis pedis pulses palpable bilaterally with no edema  Today I ordered a CT angiogram of the abdomen and pelvis which I reviewed by computer. The aneurysm sac has decreased from 5.1-4.7 cm in maximum diameter and there is no endoleak. The graft is quite stable Patient does have 2 small exophytic lesions in the kidneys which are unchanged from last year and will need to be reassessed next year at the time of the CT scan. I discussed this with the patient and he is knowledgeable regarding these lesions. Howard a carotid duplex exam which I reviewed and interpreted. There is moderate bilateral ICA stenosis which is unchanged in the 50-60% range.       Assessment:     #1 status post endovascular stent graft repair of AAA-stable with no endoleak and contracting aneurysm sac #2 asymptomatic bilateral moderate carotid stenosis-unchanged #3 history coronary artery disease coronary artery bypass grafting in 1996 with PTCA and stenting 1 year ago-asymptomatic #4  bilateral small exophytic lesions and kidneys described by radiologist on CT scan-recommended check this again next year on CT angiogram-appear unchanged from last year's study    Plan:     Return in 1 year with carotid duplex exam CT angiogram of abdomen and pelvis

## 2015-01-25 NOTE — Addendum Note (Signed)
Addended by: Mena Goes on: 01/25/2015 02:08 PM   Modules accepted: Orders

## 2015-01-28 ENCOUNTER — Telehealth: Payer: Self-pay | Admitting: Family Medicine

## 2015-01-28 DIAGNOSIS — E785 Hyperlipidemia, unspecified: Secondary | ICD-10-CM

## 2015-01-28 DIAGNOSIS — Z79899 Other long term (current) drug therapy: Secondary | ICD-10-CM

## 2015-01-28 DIAGNOSIS — D649 Anemia, unspecified: Secondary | ICD-10-CM

## 2015-01-28 NOTE — Telephone Encounter (Signed)
Patient had Lipid, Liver, Met 7 and CBC 06/2014

## 2015-01-28 NOTE — Telephone Encounter (Signed)
Pt requesting lab orders, has check up here 02/17/15, please call when done

## 2015-01-30 NOTE — Telephone Encounter (Signed)
exxact same labs

## 2015-01-31 NOTE — Telephone Encounter (Signed)
bw orders put in. Pt notified.

## 2015-02-10 DIAGNOSIS — E785 Hyperlipidemia, unspecified: Secondary | ICD-10-CM | POA: Diagnosis not present

## 2015-02-10 DIAGNOSIS — Z79899 Other long term (current) drug therapy: Secondary | ICD-10-CM | POA: Diagnosis not present

## 2015-02-10 DIAGNOSIS — D649 Anemia, unspecified: Secondary | ICD-10-CM | POA: Diagnosis not present

## 2015-02-11 LAB — CBC WITH DIFFERENTIAL/PLATELET
BASOS ABS: 0 10*3/uL (ref 0.0–0.2)
Basos: 0 %
EOS (ABSOLUTE): 0.1 10*3/uL (ref 0.0–0.4)
Eos: 1 %
HEMATOCRIT: 39.7 % (ref 37.5–51.0)
Hemoglobin: 13.2 g/dL (ref 12.6–17.7)
IMMATURE GRANULOCYTES: 0 %
Immature Grans (Abs): 0 10*3/uL (ref 0.0–0.1)
LYMPHS: 26 %
Lymphocytes Absolute: 2.1 10*3/uL (ref 0.7–3.1)
MCH: 29.9 pg (ref 26.6–33.0)
MCHC: 33.2 g/dL (ref 31.5–35.7)
MCV: 90 fL (ref 79–97)
Monocytes Absolute: 0.6 10*3/uL (ref 0.1–0.9)
Monocytes: 7 %
Neutrophils Absolute: 5.2 10*3/uL (ref 1.4–7.0)
Neutrophils: 66 %
PLATELETS: 116 10*3/uL — AB (ref 150–379)
RBC: 4.42 x10E6/uL (ref 4.14–5.80)
RDW: 13.6 % (ref 12.3–15.4)
WBC: 7.9 10*3/uL (ref 3.4–10.8)

## 2015-02-11 LAB — HEPATIC FUNCTION PANEL
ALK PHOS: 77 IU/L (ref 39–117)
ALT: 15 IU/L (ref 0–44)
AST: 20 IU/L (ref 0–40)
Albumin: 4.2 g/dL (ref 3.5–4.8)
BILIRUBIN TOTAL: 0.6 mg/dL (ref 0.0–1.2)
Bilirubin, Direct: 0.17 mg/dL (ref 0.00–0.40)
Total Protein: 7 g/dL (ref 6.0–8.5)

## 2015-02-11 LAB — LIPID PANEL
CHOLESTEROL TOTAL: 117 mg/dL (ref 100–199)
Chol/HDL Ratio: 3.3 ratio units (ref 0.0–5.0)
HDL: 35 mg/dL — ABNORMAL LOW (ref >39)
LDL Calculated: 62 mg/dL (ref 0–99)
Triglycerides: 102 mg/dL (ref 0–149)
VLDL CHOLESTEROL CAL: 20 mg/dL (ref 5–40)

## 2015-02-11 LAB — BASIC METABOLIC PANEL
BUN/Creatinine Ratio: 9 — ABNORMAL LOW (ref 10–22)
BUN: 14 mg/dL (ref 8–27)
CALCIUM: 9.3 mg/dL (ref 8.6–10.2)
CO2: 28 mmol/L (ref 18–29)
CREATININE: 1.49 mg/dL — AB (ref 0.76–1.27)
Chloride: 99 mmol/L (ref 97–108)
GFR calc Af Amer: 52 mL/min/{1.73_m2} — ABNORMAL LOW (ref >59)
GFR calc non Af Amer: 45 mL/min/{1.73_m2} — ABNORMAL LOW (ref >59)
Glucose: 95 mg/dL (ref 65–99)
Potassium: 4.6 mmol/L (ref 3.5–5.2)
SODIUM: 140 mmol/L (ref 134–144)

## 2015-02-17 ENCOUNTER — Encounter: Payer: Self-pay | Admitting: Family Medicine

## 2015-02-17 ENCOUNTER — Ambulatory Visit (INDEPENDENT_AMBULATORY_CARE_PROVIDER_SITE_OTHER): Payer: Medicare Other | Admitting: Family Medicine

## 2015-02-17 VITALS — BP 110/60 | Ht 70.75 in | Wt 191.1 lb

## 2015-02-17 DIAGNOSIS — J4520 Mild intermittent asthma, uncomplicated: Secondary | ICD-10-CM

## 2015-02-17 DIAGNOSIS — K219 Gastro-esophageal reflux disease without esophagitis: Secondary | ICD-10-CM

## 2015-02-17 DIAGNOSIS — E785 Hyperlipidemia, unspecified: Secondary | ICD-10-CM

## 2015-02-17 DIAGNOSIS — D696 Thrombocytopenia, unspecified: Secondary | ICD-10-CM | POA: Insufficient documentation

## 2015-02-17 DIAGNOSIS — I10 Essential (primary) hypertension: Secondary | ICD-10-CM | POA: Diagnosis not present

## 2015-02-17 DIAGNOSIS — J449 Chronic obstructive pulmonary disease, unspecified: Secondary | ICD-10-CM | POA: Insufficient documentation

## 2015-02-17 NOTE — Progress Notes (Signed)
Subjective:    Patient ID: Paul Sandoval, male    DOB: 11/18/1940, 74 y.o.   MRN: [id number]  Hypertension This is a chronic problem. The current episode started more than 1 year ago. The problem has been gradually improving since onset. There are no associated agents to hypertension. There are no known risk factors for coronary artery disease. Treatments tried: enalapril. The current treatment provides moderate improvement. There are no compliance problems.    Patient has no concerns at this time.   Results for orders placed or performed in visit on 01/28/15  Lipid panel  Result Value Ref Range   Cholesterol, Total 117 100 - 199 mg/dL   Triglycerides 102 0 - 149 mg/dL   HDL 35 (L) >39 mg/dL   VLDL Cholesterol Cal 20 5 - 40 mg/dL   LDL Calculated 62 0 - 99 mg/dL   Chol/HDL Ratio 3.3 0.0 - 5.0 ratio units  Hepatic function panel  Result Value Ref Range   Total Protein 7.0 6.0 - 8.5 g/dL   Albumin 4.2 3.5 - 4.8 g/dL   Bilirubin Total 0.6 0.0 - 1.2 mg/dL   Bilirubin, Direct 0.17 0.00 - 0.40 mg/dL   Alkaline Phosphatase 77 39 - 117 IU/L   AST 20 0 - 40 IU/L   ALT 15 0 - 44 IU/L  Basic metabolic panel  Result Value Ref Range   Glucose 95 65 - 99 mg/dL   BUN 14 8 - 27 mg/dL   Creatinine, Ser 1.49 (H) 0.76 - 1.27 mg/dL   GFR calc non Af Amer 45 (L) >59 mL/min/1.73   GFR calc Af Amer 52 (L) >59 mL/min/1.73   BUN/Creatinine Ratio 9 (L) 10 - 22   Sodium 140 134 - 144 mmol/L   Potassium 4.6 3.5 - 5.2 mmol/L   Chloride 99 97 - 108 mmol/L   CO2 28 18 - 29 mmol/L   Calcium 9.3 8.6 - 10.2 mg/dL  CBC with Differential/Platelet  Result Value Ref Range   WBC 7.9 3.4 - 10.8 x10E3/uL   RBC 4.42 4.14 - 5.80 x10E6/uL   Hemoglobin 13.2 12.6 - 17.7 g/dL   Hematocrit 39.7 37.5 - 51.0 %   MCV 90 79 - 97 fL   MCH 29.9 26.6 - 33.0 pg   MCHC 33.2 31.5 - 35.7 g/dL   RDW 13.6 12.3 - 15.4 %   Platelets 116 (L) 150 - 379 x10E3/uL   NEUTROPHILS 66 %   Lymphs 26 %   Monocytes 7 %   Eos 1 %   Basos 0 %   Neutrophils Absolute 5.2 1.4 - 7.0 x10E3/uL   Lymphocytes Absolute 2.1 0.7 - 3.1 x10E3/uL   Monocytes Absolute 0.6 0.1 - 0.9 x10E3/uL   EOS (ABSOLUTE) 0.1 0.0 - 0.4 x10E3/uL   Basophils Absolute 0.0 0.0 - 0.2 x10E3/uL   Immature Granulocytes 0 %   Immature Grans (Abs) 0.0 0.0 - 0.1 x10E3/uL   Patient compliant with lipid medication. No obvious side effects. Has cut down the fats in the diet.  Patient uses albuterol infrequently perhaps once per week. Wheezes with exertion and sometimes rest exposure.   Review of Systems No headache no chest pain no back pain abdominal pain no change in bowel habits no blood in stool    Objective:   Physical Exam  Alert vitals stable. HEENT normal. Lungs clear. Heart regular in rhythm. Ankles without edema.      Assessment & Plan:  Impression 1 hypertension good control #2 hyperlipidemia  good control discussed #3 asthma stable. Mild intermittent #4 low platelets this is been fairly stable over past couple years and I feel hematology workup would not be very helpful particularly considering normal white blood count and hemoglobin discussed plan all medications refilled. Diet exercise discussed. No changes in dosages. WSL follow-up as scheduled

## 2015-03-10 ENCOUNTER — Other Ambulatory Visit: Payer: Self-pay | Admitting: Physician Assistant

## 2015-03-21 NOTE — Progress Notes (Signed)
Patient ID: Paul Sandoval, male   DOB: 04/12/1941, 74 y.o.   MRN: [id number]    HPI: Paul Sandoval is a 74 y.o.  patient of Dr.Koneswaran,  we are following for ongoing assessment and management of coronary artery disease with distant history of CABG in 1996, carotid disease and aortic stent graft followed by  Dr. Kellie Simmering.   The patient was  admitted to [facility]  03/24/14 after plan diagnostic left heart catheterization, revealing severe three-vessel coronary artery disease and total occlusion of the RCA, total occlusion of the LAD, severe ulcerative stenosis of the distal left main, and severe diffuse stenosis of the OM left circumflex branches. He continued patency of the saphenous vein graft to his PDA LIMA to LAD and patent but severely diseased saphenous vein graft to the diagonal.  The patient underwent PCI of the distal left main with drug-eluting stent. A staged PCI of the saphenous vein graft to the diagonal. The patient was recommended for dual antiplatelet therapy for one year on Plavix and aspirin. He was continuing metoprolol HCTZ .   He is without complaints. He is now back to walking 2 miles every day. He is planning to play golf tomorrow. No recurrent chest pain.  Needs refill on nitro and plavix    ECG:  07/2014  SR rate 62 LPFB/RBBB old IMI and old anterior MI  Allergies  Allergen Reactions  . Neomycin     Hives     Current Outpatient Prescriptions  Medication Sig Dispense Refill  . acetaminophen (TYLENOL) 500 MG tablet Take 500 mg by mouth every 6 (six) hours as needed for mild pain.    Marland Kitchen aspirin EC 81 MG tablet Take 81 mg by mouth daily.    . clopidogrel (PLAVIX) 75 MG tablet TAKE ONE TABLET BY MOUTH DAILY WITH BREAKFAST. 30 tablet 5  . doxycycline (VIBRAMYCIN) 100 MG capsule Take 1 capsule (100 mg total) by mouth 2 (two) times daily. 10 capsule 0  . enalapril (VASOTEC) 10 MG tablet Take one and a half tablets PO BID (Patient taking differently: Take 5 mg by  mouth 2 (two) times daily. ) 90 tablet 5  . Ferrous Sulfate (IRON) 28 MG TABS Take 28 mg by mouth daily.    . hydrochlorothiazide (HYDRODIURIL) 25 MG tablet Take 0.5 tablets (12.5 mg total) by mouth daily.    . metoprolol (LOPRESSOR) 50 MG tablet Take 50 mg by mouth 2 (two) times daily.     . Multiple Vitamins-Minerals (CENTRUM SILVER ADULT 50+) TABS Take 1 tablet by mouth daily.     . nitroGLYCERIN (NITROSTAT) 0.4 MG SL tablet Place 1 tablet (0.4 mg total) under the tongue every 5 (five) minutes as needed for chest pain. 25 tablet 4  . omeprazole (PRILOSEC) 20 MG capsule Take 1 capsule (20 mg total) by mouth daily. 90 capsule 3  . simvastatin (ZOCOR) 80 MG tablet Take 40 mg by mouth at bedtime.    . triamcinolone cream (KENALOG) 0.1 % Apply 1 application topically 2 (two) times daily as needed. 45 g 1  . VENTOLIN HFA 108 (90 BASE) MCG/ACT inhaler INHALE 2 PUFFS BY MOUTH EVERY 4 TO 6 HOURS AS NEEDED FOR WHEEZING. 18 g 5   No current facility-administered medications for this visit.    Past Medical History  Diagnosis Date  . 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 AAA (abdominal aortic aneurysm) 2010    4.4 cm 08/2008;4.44 in 7/10 and 4.65 in 08/2009;  4.8 by CT in 11/2009; 4.3 by ultrasound in 08/2010  . Hypertension   . Hyperlipidemia   . Tobacco abuse, in remission     40 pack year total consumption; discontinued in 1996  . GERD (gastroesophageal reflux disease)   . Right bundle branch block   . Diverticulosis   . Colonic polyp 2002    polypectomy in 2002  . COPD (chronic obstructive pulmonary disease)   . CAD (coronary artery disease)     03/18/14:  PCI with DES to distal left main. 7/29: DES to the SVG to Diag  . ED (erectile dysfunction)   . IFG (impaired fasting glucose)   . Chronic rhinitis   . Myocardial infarction     "told h/o silent MI sometime before 1996"  . Pneumonia ~ 2001; ~ 2005  . Chronic bronchitis   . Arthritis     "fingers"  (03/18/2014)  . Cardiomyopathy, ischemic     Echo 03/17/14: EF 45-50%    Past Surgical History  Procedure Laterality Date  . Colonoscopy  2002    polypectomy-patient denies  . Laparoscopic cholecystectomy  12/2009  . Abdominal aortic endovascular stent graft N/A 12/11/2012    Procedure: ABDOMINAL AORTIC ENDOVASCULAR STENT GRAFT;  Surgeon: Mal Misty, MD;  Location: [facility];  Service: Vascular;  Laterality: N/A;  Ultrasound guided; [place]  . Abdominal aortic aneurysm repair  11/2012  . Total hip arthroplasty Left 01/21/2013    Procedure: TOTAL HIP ARTHROPLASTY ANTERIOR APPROACH;  Surgeon: Mauri Pole, MD;  Location: [facility];  Service: Orthopedics;  Laterality: Left;  . Joint replacement    . Coronary artery bypass graft  01/09/1995    "CABG X3"  . Cardiac catheterization  01/08/1995  . Coronary angioplasty with stent placement  03/18/2014    "1"  . Coronary angioplasty with stent placement  03/24/2014    "1"  . Left and right heart catheterization with coronary/graft angiogram N/A 03/18/2014    Procedure: LEFT AND RIGHT HEART CATHETERIZATION WITH Paul Sandoval;  Surgeon: Blane Ohara, MD;  Location: [facility] CATH LAB;  Service: Cardiovascular;  Laterality: N/A;  . Percutaneous coronary stent intervention (pci-s)  03/18/2014    Procedure: PERCUTANEOUS CORONARY STENT INTERVENTION (PCI-S);  Surgeon: Blane Ohara, MD;  Location: [facility] CATH LAB;  Service: Cardiovascular;;  . Percutaneous coronary stent intervention (pci-s) N/A 03/24/2014    Procedure: PERCUTANEOUS CORONARY STENT INTERVENTION (PCI-S);  Surgeon: Blane Ohara, MD;  Location: [facility] CATH LAB;  Service: Cardiovascular;  Laterality: N/A;    ROS: Review of systems complete and found to be negative unless listed above  PHYSICAL EXAM BP 120/74 mmHg  Pulse 53  Ht '5\' 10"'$  (1.778 m)  Wt 86.75 kg (191 lb 4 oz)  BMI 27.44 kg/m2  SpO2 98% General: Well developed, well nourished, in no acute distress Head: Eyes PERRLA, No xanthomas.    Normal cephalic and atramatic  Lungs: Clear bilaterally to auscultation and percussion. Heart: HRRR S1 S2, without MRG.  Pulses are 2+ & equal.            No carotid bruit. No JVD.  No abdominal bruits. No femoral bruits. Abdomen: Bowel sounds are positive, abdomen soft and non-tender without masses or                  Hernia's noted. S/P lap choly  Msk:  Back normal, normal gait. Normal strength and tone for age. Extremities: No clubbing, cyanosis or edema.  DP +1 Left wrist is free of  bleeding or hematoma. Neuro: Alert and oriented X 3. Psych:  Good affect, responds appropriately  ASSESSMENT AND PLAN  CAD:  Stable with no angina and good activity level.  Continue medical Rx refill on nitro and plavix called in HTN  Well controlled.  Continue current medications and low sodium Dash type diet.    Chol:  Cholesterol is at goal.  Continue current dose of statin and diet Rx.  No myalgias or side effects.  F/U  LFT's in 6 months. Lab Results  Component Value Date   LDLCALC 62 02/10/2015             COPD   No dyspnea continue PRN inhaler usually walks in climate controlled environment like the MAll Bifasicular Block

## 2015-03-22 ENCOUNTER — Ambulatory Visit (INDEPENDENT_AMBULATORY_CARE_PROVIDER_SITE_OTHER): Payer: Medicare Other | Admitting: Family Medicine

## 2015-03-22 ENCOUNTER — Encounter: Payer: Self-pay | Admitting: Family Medicine

## 2015-03-22 VITALS — BP 124/68 | Temp 97.5°F | Ht 70.75 in | Wt 192.4 lb

## 2015-03-22 DIAGNOSIS — T148 Other injury of unspecified body region: Secondary | ICD-10-CM | POA: Diagnosis not present

## 2015-03-22 DIAGNOSIS — W57XXXA Bitten or stung by nonvenomous insect and other nonvenomous arthropods, initial encounter: Secondary | ICD-10-CM | POA: Diagnosis not present

## 2015-03-22 MED ORDER — DOXYCYCLINE HYCLATE 100 MG PO CAPS: 100.0000 mg | ORAL_CAPSULE | Freq: Two times a day (BID) | ORAL | Status: AC

## 2015-03-22 MED ORDER — TRIAMCINOLONE ACETONIDE 0.1 % EX CREA: 1.0000 "application " | TOPICAL_CREAM | Freq: Two times a day (BID) | CUTANEOUS | Status: DC | PRN

## 2015-03-22 NOTE — Progress Notes (Signed)
   Subjective:    Patient ID: ARMEND HOCHSTATTER, male    DOB: Oct 29, 1940, 74 y.o.   MRN: [id number]  Rash This is a new problem. The current episode started in the past 7 days. The problem is unchanged. The affected locations include the right upper leg, right lowerleg, right foot and right toes. The rash is characterized by itchiness. It is unknown if there was an exposure to a precipitant. Past treatments include anti-itch cream. The treatment provided mild relief.   Patient states that he has no other concerns at this time.    Review of Systems  Skin: Positive for rash.   Patient has not been having any fever. No vomiting. Denies muscle aches or headaches.    Objective:   Physical Exam There are several bumps one on the foot one on the ankle one on the back of the calf one on the thigh all of them have mild inflammation and thickening which in turn can increase severe itching. They appear to be insect bites. The one on the inner thigh had a hard and black area to it. A #15 blade was used to remove the top of this area and look at it closely. There was some central skin necrosis. Consistent with the possibility of a tick bite  In my opinion I do not feel the patient needs any lab work I do not feel the patient needs prednisone at this time     Assessment & Plan:  Doxycycline twice a day for 5 days with food and water If severe redness swelling fever or other problems immediately follow-up Kenalog cream up to 4 times a day as needed for itching, follow-up if ongoing troubles.

## 2015-03-23 ENCOUNTER — Encounter: Payer: Self-pay | Admitting: Cardiovascular Disease

## 2015-03-23 ENCOUNTER — Ambulatory Visit (INDEPENDENT_AMBULATORY_CARE_PROVIDER_SITE_OTHER): Payer: Medicare Other | Admitting: Cardiovascular Disease

## 2015-03-23 VITALS — BP 120/74 | HR 53 | Ht 70.0 in | Wt 191.2 lb

## 2015-03-23 DIAGNOSIS — I2510 Atherosclerotic heart disease of native coronary artery without angina pectoris: Secondary | ICD-10-CM | POA: Diagnosis not present

## 2015-03-23 DIAGNOSIS — I2583 Coronary atherosclerosis due to lipid rich plaque: Principal | ICD-10-CM

## 2015-03-23 MED ORDER — CLOPIDOGREL BISULFATE 75 MG PO TABS: ORAL_TABLET | ORAL | Status: DC

## 2015-03-23 MED ORDER — NITROGLYCERIN 0.4 MG SL SUBL: 0.4000 mg | SUBLINGUAL_TABLET | SUBLINGUAL | Status: DC | PRN

## 2015-03-23 NOTE — Patient Instructions (Addendum)
Medication Instructions:  NO CHANGES  Labwork: NONE  Testing/Procedures: NONE  Follow-Up: Your physician wants you to follow-up in:   [place] will receive a reminder letter in the mail two months in advance. If you don't receive a letter, please call our office to schedule the follow-up appointment.  Any Other Special Instructions Will Be Listed Below (If Applicable).

## 2015-05-31 ENCOUNTER — Ambulatory Visit (INDEPENDENT_AMBULATORY_CARE_PROVIDER_SITE_OTHER): Payer: Medicare Other | Admitting: *Deleted

## 2015-05-31 DIAGNOSIS — Z23 Encounter for immunization: Secondary | ICD-10-CM | POA: Diagnosis not present

## 2015-06-01 ENCOUNTER — Ambulatory Visit: Payer: Medicare Other

## 2015-06-17 ENCOUNTER — Encounter: Payer: Self-pay | Admitting: Family Medicine

## 2015-06-17 ENCOUNTER — Ambulatory Visit (INDEPENDENT_AMBULATORY_CARE_PROVIDER_SITE_OTHER): Payer: Medicare Other | Admitting: Family Medicine

## 2015-06-17 VITALS — BP 132/80 | Temp 98.0°F | Ht 70.25 in | Wt 191.0 lb

## 2015-06-17 DIAGNOSIS — J329 Chronic sinusitis, unspecified: Secondary | ICD-10-CM | POA: Diagnosis not present

## 2015-06-17 DIAGNOSIS — J209 Acute bronchitis, unspecified: Secondary | ICD-10-CM | POA: Diagnosis not present

## 2015-06-17 MED ORDER — AZITHROMYCIN 250 MG PO TABS: ORAL_TABLET | ORAL | Status: DC

## 2015-06-17 NOTE — Progress Notes (Signed)
   Subjective:    Patient ID: Paul Sandoval, male    DOB: 1941-03-07, 74 y.o.   MRN: [id number]  Cough This is a new problem. The current episode started yesterday. Associated symptoms include nasal congestion and a sore throat. Treatments tried: robitussin dm.   Coughing and cong    Some wheezing with this. Has started to use inhaler.  Cough productive of yellowish phlegm.  Frontal headache  Review of Systems  HENT: Positive for sore throat.   Respiratory: Positive for cough.        Objective:   Physical Exam  Alert mild malaise. Vitals stable HEENT moderate nasal congestion frontal tenderness pharynx normal neck supple lungs clear. Heart regular in rhythm.      Assessment & Plan:  Impression 1 rhinosinusitis plan antibiotics prescribed. Symptomatic care discussed warning signs discussed WSL

## 2015-08-23 ENCOUNTER — Encounter: Payer: Self-pay | Admitting: Family Medicine

## 2015-08-23 ENCOUNTER — Ambulatory Visit (INDEPENDENT_AMBULATORY_CARE_PROVIDER_SITE_OTHER): Payer: Medicare Other | Admitting: Family Medicine

## 2015-08-23 VITALS — BP 120/70 | Ht 71.0 in | Wt 191.4 lb

## 2015-08-23 DIAGNOSIS — Z125 Encounter for screening for malignant neoplasm of prostate: Secondary | ICD-10-CM

## 2015-08-23 DIAGNOSIS — Z0000 Encounter for general adult medical examination without abnormal findings: Secondary | ICD-10-CM

## 2015-08-23 DIAGNOSIS — I10 Essential (primary) hypertension: Secondary | ICD-10-CM

## 2015-08-23 DIAGNOSIS — Z79899 Other long term (current) drug therapy: Secondary | ICD-10-CM

## 2015-08-23 DIAGNOSIS — E785 Hyperlipidemia, unspecified: Secondary | ICD-10-CM

## 2015-08-23 MED ORDER — SIMVASTATIN 80 MG PO TABS: 40.0000 mg | ORAL_TABLET | Freq: Every day | ORAL | Status: DC

## 2015-08-23 MED ORDER — ENALAPRIL MALEATE 10 MG PO TABS: ORAL_TABLET | ORAL | Status: DC

## 2015-08-23 NOTE — Progress Notes (Signed)
   Subjective:    Patient ID: Paul Sandoval, male    DOB: 1941/03/15, 74 y.o.   MRN: [id number]  HPI AWV- Annual Wellness Visit  The patient was seen for their annual wellness visit. The patient's past medical history, surgical history, and family history were reviewed. Pertinent vaccines were reviewed ( tetanus, pneumonia, shingles, flu) The patient's medication list was reviewed and updated.  The height and weight were entered. The patient's current BMI is: 26.69  Cognitive screening was completed. Outcome of Mini - Cog: passed  Falls within the past 6 months: none  Current tobacco usage: non-smoker (All patients who use tobacco were given written and verbal information on quitting)  Recent listing of emergency department/hospitalizations over the past year were reviewed.  current specialist the patient sees on a regular basis: none   Medicare annual wellness visit patient questionnaire was reviewed.  A written screening schedule for the patient for the next 5-10 years was given. Appropriate discussion of followup regarding next visit was discussed.  Colonoscopy- patient doesn't remember when his last one was.   2012  Asthma stable min use of albuterol. Rarely uses  bp meds compliant. No obvious side effects. Medications reviewed today. Blood pressure generally good when checked elsewhere.  Compliant with lipid medications. Does note some achiness in joints and muscles wonders if it may be related. Realizes he needs to meds reduce risk of heart difficulties    Review of Systems  Constitutional: Negative for fever, activity change and appetite change.  HENT: Negative for congestion and rhinorrhea.   Eyes: Negative for discharge.  Respiratory: Negative for cough and wheezing.   Cardiovascular: Negative for chest pain.  Gastrointestinal: Negative for vomiting, abdominal pain and blood in stool.  Genitourinary: Negative for frequency and difficulty urinating.    Musculoskeletal: Negative for neck pain.  Skin: Negative for rash.  Allergic/Immunologic: Negative for environmental allergies and food allergies.  Neurological: Negative for weakness and headaches.  Psychiatric/Behavioral: Negative for agitation.  All other systems reviewed and are negative.      Objective:   Physical Exam  Constitutional: He appears well-developed and well-nourished.  HENT:  Head: Normocephalic and atraumatic.  Right Ear: External ear normal.  Left Ear: External ear normal.  Nose: Nose normal.  Mouth/Throat: Oropharynx is clear and moist.  Eyes: EOM are normal. Pupils are equal, round, and reactive to light.  Neck: Normal range of motion. Neck supple. No thyromegaly present.  Cardiovascular: Normal rate, regular rhythm and normal heart sounds.   No murmur heard. Pulmonary/Chest: Effort normal and breath sounds normal. No respiratory distress. He has no wheezes.  Abdominal: Soft. Bowel sounds are normal. He exhibits no distension and no mass. There is no tenderness.  Genitourinary: Penis normal.  Musculoskeletal: Normal range of motion. He exhibits no edema.  Lymphadenopathy:    He has no cervical adenopathy.  Neurological: He is alert. He exhibits normal muscle tone.  Skin: Skin is warm and dry. No erythema.  Psychiatric: He has a normal mood and affect. His behavior is normal. Judgment normal.  Vitals reviewed.         Assessment & Plan:  Impression 1 will adult exam #2 hypertension good control meds reviewed to maintain same #3 hyperlipidemia good control to maintain same pending repeat 1 work. #3 asthma clinically stable plan colonoscopy in 6 months. Diet exercise discussed. Appropriate medications.up-to-date on vaccines patient to call this spring and set up follow-up colonoscopy WSL

## 2015-08-24 ENCOUNTER — Encounter: Payer: Self-pay | Admitting: Family Medicine

## 2015-08-24 LAB — LIPID PANEL
Chol/HDL Ratio: 4 ratio units (ref 0.0–5.0)
Cholesterol, Total: 148 mg/dL (ref 100–199)
HDL: 37 mg/dL — AB (ref >39)
LDL Calculated: 83 mg/dL (ref 0–99)
TRIGLYCERIDES: 139 mg/dL (ref 0–149)
VLDL CHOLESTEROL CAL: 28 mg/dL (ref 5–40)

## 2015-08-24 LAB — BASIC METABOLIC PANEL
BUN / CREAT RATIO: 11 (ref 10–22)
BUN: 16 mg/dL (ref 8–27)
CHLORIDE: 96 mmol/L (ref 96–106)
CO2: 26 mmol/L (ref 18–29)
Calcium: 9.1 mg/dL (ref 8.6–10.2)
Creatinine, Ser: 1.45 mg/dL — ABNORMAL HIGH (ref 0.76–1.27)
GFR calc Af Amer: 54 mL/min/{1.73_m2} — ABNORMAL LOW (ref >59)
GFR calc non Af Amer: 47 mL/min/{1.73_m2} — ABNORMAL LOW (ref >59)
GLUCOSE: 89 mg/dL (ref 65–99)
Potassium: 4.8 mmol/L (ref 3.5–5.2)
SODIUM: 140 mmol/L (ref 134–144)

## 2015-08-24 LAB — HEPATIC FUNCTION PANEL
ALT: 11 IU/L (ref 0–44)
AST: 20 IU/L (ref 0–40)
Albumin: 4.2 g/dL (ref 3.5–4.8)
Alkaline Phosphatase: 95 IU/L (ref 39–117)
BILIRUBIN, DIRECT: 0.13 mg/dL (ref 0.00–0.40)
Bilirubin Total: 0.6 mg/dL (ref 0.0–1.2)
TOTAL PROTEIN: 7.1 g/dL (ref 6.0–8.5)

## 2015-08-24 LAB — PSA: PROSTATE SPECIFIC AG, SERUM: 1.6 ng/mL (ref 0.0–4.0)

## 2015-09-02 ENCOUNTER — Other Ambulatory Visit: Payer: Self-pay | Admitting: Family Medicine

## 2015-10-12 ENCOUNTER — Ambulatory Visit (INDEPENDENT_AMBULATORY_CARE_PROVIDER_SITE_OTHER): Payer: Medicare Other | Admitting: Family Medicine

## 2015-10-12 ENCOUNTER — Encounter: Payer: Self-pay | Admitting: Family Medicine

## 2015-10-12 VITALS — BP 144/72 | Temp 98.5°F | Ht 70.0 in | Wt 193.0 lb

## 2015-10-12 DIAGNOSIS — J209 Acute bronchitis, unspecified: Secondary | ICD-10-CM | POA: Diagnosis not present

## 2015-10-12 DIAGNOSIS — J329 Chronic sinusitis, unspecified: Secondary | ICD-10-CM | POA: Diagnosis not present

## 2015-10-12 MED ORDER — AZITHROMYCIN 250 MG PO TABS: ORAL_TABLET | ORAL | Status: DC

## 2015-10-12 NOTE — Progress Notes (Signed)
   Subjective:    Patient ID: ABDULAZIZ TOMAN, male    DOB: 08-04-1941, 75 y.o.   MRN: [id number]  Cough This is a new problem. Episode onset: 3 days. Associated symptoms include nasal congestion and wheezing.    Coughing and cong  Pos prod yellow ish phlegm Headache frontal in nature  Wheezy with it worse at night  No fever  Good appetite   Review of Systems  Respiratory: Positive for cough and wheezing.        Objective:   Physical Exam  Alert, mild malaise. Hydration good Vitals stable. frontal/ maxillary tenderness evident positive nasal congestion. pharynx normal neck supple  lungs clear/no crackles or wheezes. heart regular in rhythm       Assessment & Plan:  Impression rhinosinusitis likely post viral, discussed with patient. plan antibiotics prescribed. Questions answered. Symptomatic care discussed. warning signs discussed. WSL

## 2015-11-11 ENCOUNTER — Emergency Department (HOSPITAL_COMMUNITY): Payer: Medicare Other

## 2015-11-11 ENCOUNTER — Inpatient Hospital Stay (HOSPITAL_COMMUNITY)
Admission: EM | Admit: 2015-11-11 | Discharge: 2015-11-14 | DRG: 204 | Disposition: A | Discharge status: Home / Self Care | Payer: Medicare Other | Attending: Pulmonary Disease | Admitting: Pulmonary Disease

## 2015-11-11 ENCOUNTER — Encounter (HOSPITAL_COMMUNITY): Payer: Self-pay

## 2015-11-11 DIAGNOSIS — Z79899 Other long term (current) drug therapy: Secondary | ICD-10-CM | POA: Diagnosis not present

## 2015-11-11 DIAGNOSIS — Z8261 Family history of arthritis: Secondary | ICD-10-CM | POA: Diagnosis not present

## 2015-11-11 DIAGNOSIS — D62 Acute posthemorrhagic anemia: Secondary | ICD-10-CM | POA: Diagnosis not present

## 2015-11-11 DIAGNOSIS — R000 Tachycardia, unspecified: Secondary | ICD-10-CM | POA: Diagnosis present

## 2015-11-11 DIAGNOSIS — Z96642 Presence of left artificial hip joint: Secondary | ICD-10-CM | POA: Diagnosis not present

## 2015-11-11 DIAGNOSIS — Z809 Family history of malignant neoplasm, unspecified: Secondary | ICD-10-CM | POA: Diagnosis not present

## 2015-11-11 DIAGNOSIS — I255 Ischemic cardiomyopathy: Secondary | ICD-10-CM | POA: Diagnosis present

## 2015-11-11 DIAGNOSIS — I129 Hypertensive chronic kidney disease with stage 1 through stage 4 chronic kidney disease, or unspecified chronic kidney disease: Secondary | ICD-10-CM | POA: Diagnosis not present

## 2015-11-11 DIAGNOSIS — Z8701 Personal history of pneumonia (recurrent): Secondary | ICD-10-CM | POA: Diagnosis not present

## 2015-11-11 DIAGNOSIS — Z86010 Personal history of colonic polyps: Secondary | ICD-10-CM | POA: Diagnosis not present

## 2015-11-11 DIAGNOSIS — Z87891 Personal history of nicotine dependence: Secondary | ICD-10-CM | POA: Diagnosis not present

## 2015-11-11 DIAGNOSIS — Z9889 Other specified postprocedural states: Secondary | ICD-10-CM

## 2015-11-11 DIAGNOSIS — R042 Hemoptysis: Principal | ICD-10-CM | POA: Diagnosis present

## 2015-11-11 DIAGNOSIS — N183 Chronic kidney disease, stage 3 unspecified: Secondary | ICD-10-CM | POA: Diagnosis present

## 2015-11-11 DIAGNOSIS — Z955 Presence of coronary angioplasty implant and graft: Secondary | ICD-10-CM | POA: Diagnosis not present

## 2015-11-11 DIAGNOSIS — J449 Chronic obstructive pulmonary disease, unspecified: Secondary | ICD-10-CM | POA: Diagnosis present

## 2015-11-11 DIAGNOSIS — I252 Old myocardial infarction: Secondary | ICD-10-CM | POA: Diagnosis not present

## 2015-11-11 DIAGNOSIS — D72829 Elevated white blood cell count, unspecified: Secondary | ICD-10-CM | POA: Diagnosis present

## 2015-11-11 DIAGNOSIS — I10 Essential (primary) hypertension: Secondary | ICD-10-CM | POA: Diagnosis present

## 2015-11-11 DIAGNOSIS — N1832 Chronic kidney disease, stage 3b: Secondary | ICD-10-CM | POA: Diagnosis present

## 2015-11-11 DIAGNOSIS — J9801 Acute bronchospasm: Secondary | ICD-10-CM | POA: Diagnosis present

## 2015-11-11 DIAGNOSIS — I2510 Atherosclerotic heart disease of native coronary artery without angina pectoris: Secondary | ICD-10-CM | POA: Diagnosis present

## 2015-11-11 DIAGNOSIS — Z7982 Long term (current) use of aspirin: Secondary | ICD-10-CM | POA: Diagnosis not present

## 2015-11-11 DIAGNOSIS — R748 Abnormal levels of other serum enzymes: Secondary | ICD-10-CM | POA: Diagnosis present

## 2015-11-11 DIAGNOSIS — E878 Other disorders of electrolyte and fluid balance, not elsewhere classified: Secondary | ICD-10-CM | POA: Diagnosis present

## 2015-11-11 DIAGNOSIS — R918 Other nonspecific abnormal finding of lung field: Secondary | ICD-10-CM | POA: Diagnosis not present

## 2015-11-11 DIAGNOSIS — I714 Abdominal aortic aneurysm, without rupture, unspecified: Secondary | ICD-10-CM | POA: Diagnosis present

## 2015-11-11 DIAGNOSIS — E871 Hypo-osmolality and hyponatremia: Secondary | ICD-10-CM | POA: Diagnosis not present

## 2015-11-11 DIAGNOSIS — Z820 Family history of epilepsy and other diseases of the nervous system: Secondary | ICD-10-CM

## 2015-11-11 DIAGNOSIS — Z8249 Family history of ischemic heart disease and other diseases of the circulatory system: Secondary | ICD-10-CM

## 2015-11-11 DIAGNOSIS — Z9049 Acquired absence of other specified parts of digestive tract: Secondary | ICD-10-CM

## 2015-11-11 DIAGNOSIS — Z951 Presence of aortocoronary bypass graft: Secondary | ICD-10-CM

## 2015-11-11 DIAGNOSIS — J9600 Acute respiratory failure, unspecified whether with hypoxia or hypercapnia: Secondary | ICD-10-CM

## 2015-11-11 DIAGNOSIS — Z888 Allergy status to other drugs, medicaments and biological substances status: Secondary | ICD-10-CM | POA: Diagnosis not present

## 2015-11-11 DIAGNOSIS — J310 Chronic rhinitis: Secondary | ICD-10-CM | POA: Diagnosis present

## 2015-11-11 DIAGNOSIS — K219 Gastro-esophageal reflux disease without esophagitis: Secondary | ICD-10-CM | POA: Diagnosis present

## 2015-11-11 LAB — COMPREHENSIVE METABOLIC PANEL
ALBUMIN: 3.5 g/dL (ref 3.5–5.0)
ALK PHOS: 79 U/L (ref 38–126)
ALT: 16 U/L — AB (ref 17–63)
AST: 22 U/L (ref 15–41)
Anion gap: 8 (ref 5–15)
BILIRUBIN TOTAL: 0.5 mg/dL (ref 0.3–1.2)
BUN: 19 mg/dL (ref 6–20)
CALCIUM: 8.7 mg/dL — AB (ref 8.9–10.3)
CHLORIDE: 95 mmol/L — AB (ref 101–111)
CO2: 30 mmol/L (ref 22–32)
CREATININE: 1.43 mg/dL — AB (ref 0.61–1.24)
GFR, EST AFRICAN AMERICAN: 54 mL/min — AB (ref >60)
GFR, EST NON AFRICAN AMERICAN: 46 mL/min — AB (ref >60)
Glucose, Bld: 133 mg/dL — ABNORMAL HIGH (ref 65–99)
Potassium: 4 mmol/L (ref 3.5–5.1)
SODIUM: 133 mmol/L — AB (ref 135–145)
TOTAL PROTEIN: 7.9 g/dL (ref 6.5–8.1)

## 2015-11-11 LAB — CBC WITH DIFFERENTIAL/PLATELET
BASOS ABS: 0 10*3/uL (ref 0.0–0.1)
BASOS PCT: 0 %
EOS ABS: 0.1 10*3/uL (ref 0.0–0.7)
EOS PCT: 1 %
HCT: 36.2 % — ABNORMAL LOW (ref 39.0–52.0)
HEMOGLOBIN: 11.6 g/dL — AB (ref 13.0–17.0)
LYMPHS ABS: 1.9 10*3/uL (ref 0.7–4.0)
Lymphocytes Relative: 16 %
MCH: 28.3 pg (ref 26.0–34.0)
MCHC: 32 g/dL (ref 30.0–36.0)
MCV: 88.3 fL (ref 78.0–100.0)
Monocytes Absolute: 1 10*3/uL (ref 0.1–1.0)
Monocytes Relative: 9 %
NEUTROS PCT: 74 %
Neutro Abs: 8.7 10*3/uL — ABNORMAL HIGH (ref 1.7–7.7)
PLATELETS: 195 10*3/uL (ref 150–400)
RBC: 4.1 MIL/uL — AB (ref 4.22–5.81)
RDW: 13.8 % (ref 11.5–15.5)
WBC: 11.7 10*3/uL — AB (ref 4.0–10.5)

## 2015-11-11 LAB — I-STAT CHEM 8, ED
BUN: 19 mg/dL (ref 6–20)
CHLORIDE: 94 mmol/L — AB (ref 101–111)
Calcium, Ion: 1.11 mmol/L — ABNORMAL LOW (ref 1.13–1.30)
Creatinine, Ser: 1.4 mg/dL — ABNORMAL HIGH (ref 0.61–1.24)
Glucose, Bld: 131 mg/dL — ABNORMAL HIGH (ref 65–99)
HEMATOCRIT: 40 % (ref 39.0–52.0)
Hemoglobin: 13.6 g/dL (ref 13.0–17.0)
POTASSIUM: 4 mmol/L (ref 3.5–5.1)
SODIUM: 136 mmol/L (ref 135–145)
TCO2: 29 mmol/L (ref 0–100)

## 2015-11-11 LAB — ABO/RH: ABO/RH(D): O POS

## 2015-11-11 LAB — PREPARE RBC (CROSSMATCH)

## 2015-11-11 LAB — PROTIME-INR
INR: 1.04 (ref 0.00–1.49)
PROTHROMBIN TIME: 13.8 s (ref 11.6–15.2)

## 2015-11-11 LAB — APTT: APTT: 32 s (ref 24–37)

## 2015-11-11 MED ORDER — IOHEXOL 350 MG/ML SOLN
100.0000 mL | Freq: Once | INTRAVENOUS | Status: AC | PRN
  Administered 2015-11-11: 100 mL via INTRAVENOUS

## 2015-11-11 MED ORDER — BISACODYL 5 MG PO TBEC: 5.0000 mg | DELAYED_RELEASE_TABLET | Freq: Every day | ORAL | Status: DC | PRN

## 2015-11-11 MED ORDER — FERROUS FUMARATE 324 (106 FE) MG PO TABS
1.0000 | ORAL_TABLET | Freq: Every day | ORAL | Status: DC
  Administered 2015-11-12 – 2015-11-14 (×3): 106 mg via ORAL
  Filled 2015-11-11 (×4): qty 1

## 2015-11-11 MED ORDER — MORPHINE SULFATE (PF) 2 MG/ML IV SOLN: 2.0000 mg | INTRAVENOUS | Status: DC | PRN

## 2015-11-11 MED ORDER — POLYETHYLENE GLYCOL 3350 17 G PO PACK: 17.0000 g | PACK | Freq: Every day | ORAL | Status: DC | PRN

## 2015-11-11 MED ORDER — SODIUM CHLORIDE 0.9 % IV SOLN: Freq: Once | INTRAVENOUS | Status: DC

## 2015-11-11 MED ORDER — SODIUM CHLORIDE 0.9% FLUSH
3.0000 mL | Freq: Two times a day (BID) | INTRAVENOUS | Status: DC
  Administered 2015-11-12: 10 mL via INTRAVENOUS
  Administered 2015-11-13 – 2015-11-14 (×2): 3 mL via INTRAVENOUS

## 2015-11-11 MED ORDER — ACETAMINOPHEN 500 MG PO TABS
500.0000 mg | ORAL_TABLET | Freq: Four times a day (QID) | ORAL | Status: DC | PRN
  Administered 2015-11-12: 500 mg via ORAL
  Filled 2015-11-11: qty 1

## 2015-11-11 MED ORDER — ONDANSETRON HCL 4 MG/2ML IJ SOLN: 4.0000 mg | Freq: Four times a day (QID) | INTRAMUSCULAR | Status: DC | PRN

## 2015-11-11 MED ORDER — NITROGLYCERIN 0.4 MG SL SUBL: 0.4000 mg | SUBLINGUAL_TABLET | SUBLINGUAL | Status: DC | PRN

## 2015-11-11 MED ORDER — HYDROCODONE-ACETAMINOPHEN 5-325 MG PO TABS: 1.0000 | ORAL_TABLET | ORAL | Status: DC | PRN

## 2015-11-11 MED ORDER — SODIUM CHLORIDE 0.9 % IV SOLN
INTRAVENOUS | Status: DC
  Administered 2015-11-11: 23:00:00 via INTRAVENOUS

## 2015-11-11 MED ORDER — IPRATROPIUM-ALBUTEROL 0.5-2.5 (3) MG/3ML IN SOLN
3.0000 mL | RESPIRATORY_TRACT | Status: DC | PRN
  Administered 2015-11-11: 3 mL via RESPIRATORY_TRACT
  Filled 2015-11-11: qty 3

## 2015-11-11 MED ORDER — ADULT MULTIVITAMIN W/MINERALS CH
1.0000 | ORAL_TABLET | Freq: Every day | ORAL | Status: DC
  Administered 2015-11-12 – 2015-11-14 (×3): 1 via ORAL
  Filled 2015-11-11 (×3): qty 1

## 2015-11-11 MED ORDER — PANTOPRAZOLE SODIUM 40 MG PO TBEC
40.0000 mg | DELAYED_RELEASE_TABLET | Freq: Every day | ORAL | Status: DC
Start: 2015-11-12 — End: 2015-11-14
  Administered 2015-11-12 – 2015-11-14 (×3): 40 mg via ORAL
  Filled 2015-11-11 (×3): qty 1

## 2015-11-11 MED ORDER — ONDANSETRON HCL 4 MG PO TABS: 4.0000 mg | ORAL_TABLET | Freq: Four times a day (QID) | ORAL | Status: DC | PRN

## 2015-11-11 MED ORDER — ATORVASTATIN CALCIUM 40 MG PO TABS
40.0000 mg | ORAL_TABLET | Freq: Every day | ORAL | Status: DC
  Administered 2015-11-12 – 2015-11-13 (×2): 40 mg via ORAL
  Filled 2015-11-11 (×5): qty 1

## 2015-11-11 NOTE — ED Notes (Signed)
Started coughing up blood around 20 minutes ago. Bright red blood with some dark places noted in the cup.  Coughed up around a coffee cup full per pt.

## 2015-11-11 NOTE — ED Notes (Signed)
Pt intermittently coughing and spitting up bright red blood

## 2015-11-11 NOTE — H&P (Signed)
[facility] History and Physical  JACORY KAMEL HAL:[id number] DOB: 07-Apr-1941 DOA: 11/11/2015  Referring physician: ED physician PCP: [name], MD  Specialists: Dr. Bronson Ing (cardiology), Dr. Gala Romney (GI), Dr. Kellie Simmering (vascular)   Chief Complaint:  Hemoptysis, wheeze  HPI: NICKOLAI RINKS is a 75 y.o. male with PMH of CAD with remote CABG and subsequent graft stenting, ischemic cardiomyopathy, COPD not on home O2, hypertension, hyperlipidemia, and AAA status post stent graft now presenting to the ED with acute onset hemoptysis and wheezing just prior to arrival. Patient reports that he is been in his usual state of fairly good health and walked his usual 2 miles today without incident. At approximately 7:30 PM on the evening of admission, the patient experienced cough productive of bright red blood and subsequent development of wheezing. Patient reports expectoration of approximately 1 "coffee cup" full of blood prior to his arrival in the ED. His COPD is described as mild with no scheduled medications and infrequent use of albuterol MDI. He denies any known exposure to TB, long distance travel, or unilateral leg swelling or tenderness. There are no associated chest pains or palpitations and he has never experienced similar symptoms previously.  In ED, patient was found to be afebrile, saturating well on room air, with mild tachycardia, and stable blood pressure. CBC is notable for a leukocytosis to 11,700 and hemoglobin of 11.6, down from his apparent baseline of 12.5-13.5. CMP features a mild hyponatremia and hypochloremia, as well as elevated but stable serum creatinine of 1.43. Chest x-ray features a new focal masslike opacity in the medial right upper lobe and this was followed with CTA chest. 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 Unfortunately, there  also appears to be right hilar, subcarinal, right paratracheal, and left upper prevascular mediastinal lymphadenopathy that is concerning for nodal metastases. Dr. Vaughan Browner of pulmonology was consulted from the emergency department and recommended admission to [facility] for further workup with bronchoscopy. Hospitalists were called to help facilitate this.  Where does patient live?   At home    Can patient participate in ADLs?  Yes       Review of Systems:   General: no fevers, chills, sweats, poor appetite, or fatigue. Intentional 10 lb wt loss in 1 yr.  HEENT: no blurry vision, hearing changes or sore throat Pulm:  Dyspnea, cough, wheeze, hemoptysis CV: no chest pain or palpitations Abd: no nausea, vomiting, abdominal pain, diarrhea, or constipation GU: no dysuria, hematuria, increased urinary frequency, or urgency  Ext: no leg edema Neuro: no focal weakness, numbness, or tingling, no vision change or hearing loss Skin: no rash, no wounds MSK: No muscle spasm, no deformity, no red, hot, or swollen joint Heme: No easy bruising or bleeding Travel history: No recent long distant travel    Allergy:  Allergies  Allergen Reactions  . Neomycin     Hives     Past Medical History  Diagnosis Date  . 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 AAA (abdominal aortic aneurysm) ([place]) 2010    4.4 cm 08/2008;4.44 in 7/10 and 4.65 in 08/2009; 4.8 by CT in 11/2009; 4.3 by ultrasound in 08/2010  . Hypertension   . Hyperlipidemia   . Tobacco abuse, in remission     40 pack year total consumption; discontinued in 1996  . GERD (gastroesophageal reflux  disease)   . Right bundle branch block   . Diverticulosis   . Colonic polyp 2002    polypectomy in 2002  . COPD (chronic obstructive pulmonary disease) ([place])   . CAD (coronary artery disease)     03/18/14:  PCI with DES to distal left main. 7/29: DES to the SVG to Diag  . ED (erectile dysfunction)   . IFG (impaired  fasting glucose)   . Chronic rhinitis   . Myocardial infarction [facility])     "told h/o silent MI sometime before 1996"  . Pneumonia ~ 2001; ~ 2005  . Chronic bronchitis ([place])   . Arthritis     "fingers" (03/18/2014)  . Cardiomyopathy, ischemic     Echo 03/17/14: EF 45-50%    Past Surgical History  Procedure Laterality Date  . Colonoscopy  2002    polypectomy-patient denies  . Laparoscopic cholecystectomy  12/2009  . Abdominal aortic endovascular stent graft N/A 12/11/2012    Procedure: ABDOMINAL AORTIC ENDOVASCULAR STENT GRAFT;  Surgeon: Mal Misty, MD;  Location: [facility];  Service: Vascular;  Laterality: N/A;  Ultrasound guided; [place]  . Abdominal aortic aneurysm repair  11/2012  . Total hip arthroplasty Left 01/21/2013    Procedure: TOTAL HIP ARTHROPLASTY ANTERIOR APPROACH;  Surgeon: Mauri Pole, MD;  Location: [place];  Service: Orthopedics;  Laterality: Left;  . Joint replacement    . Coronary artery bypass graft  01/09/1995    "CABG X3"  . Cardiac catheterization  01/08/1995  . Coronary angioplasty with stent placement  03/18/2014    "1"  . Coronary angioplasty with stent placement  03/24/2014    "1"  . Left and right heart catheterization with coronary/graft angiogram N/A 03/18/2014    Procedure: LEFT AND RIGHT HEART CATHETERIZATION WITH Beatrix Fetters;  Surgeon: Blane Ohara, MD;  Location[facility] C[facility] LAB;  Service: Cardiovascular;  Laterality: N/A;  . Percutaneous coronary stent intervention (pci-s)  03/18/2014    Procedure: PERCUTANEOUS CORONARY STENT INTERVENTION (PCI-S);  Surgeon: Blane Ohara, MD;  Location: [facility] CATH LAB;  Service: Cardiovascular;;  . Percutaneous coronary stent intervention (pci-s) N/A 03/24/2014    Procedure: PERCUTANEOUS CORONARY STENT INTERVENTION (PCI-S);  Surgeon: Blane Ohara, MD;  Location: [facility] CATH LAB;  Service: Cardiovascular;  Laterality: N/A;    Social History:  reports that he quit smoking about 20 years ago. His smoking use included  Cigarettes. He started smoking about 58 years ago. He has a 45 pack-year smoking history. He has never used smokeless tobacco. He reports that he drinks alcohol. He reports that he does not use illicit drugs.  Family History:  Family History  Problem Relation Age of Onset  . Colon cancer Neg Hx   . Colon polyps Neg Hx   . Heart disease Father   . Arthritis Mother   . Parkinsonism Mother   . Arthritis Sister     Brother with rheumatoid arthritis  . Cancer Father     Lung  . Hypertension Brother      Prior to Admission medications   Medication Sig Start Date End Date Taking? Authorizing Provider  acetaminophen (TYLENOL) 500 MG tablet Take 500 mg by mouth every 6 (six) hours as needed for mild pain.   Yes Historical Provider, MD  aspirin EC 81 MG tablet Take 81 mg by mouth daily.   Yes Historical Provider, MD  clopidogrel (PLAVIX) 75 MG tablet TAKE ONE TABLET BY MOUTH DAILY WITH BREAKFAST. 03/23/15  Yes Josue Hector, MD  enalapril (VASOTEC) 10 MG tablet Take one and a half tablets PO BID 08/23/15  Yes [name], MD  Ferrous Sulfate (IRON) 28 MG TABS Take 28 mg by mouth daily.   Yes Historical Provider, MD  hydrochlorothiazide (HYDRODIURIL) 25 MG tablet Take 0.5 tablets (12.5 mg total) by mouth daily. 03/19/14  Yes Brett Canales, PA-C  metoprolol (LOPRESSOR) 50 MG tablet Take 50 mg by mouth 2 (two) times daily.    Yes Historical Provider, MD  Multiple Vitamins-Minerals (CENTRUM SILVER ADULT 50+) TABS Take 1 tablet by mouth daily.    Yes Historical Provider, MD  nitroGLYCERIN (NITROSTAT) 0.4 MG SL tablet Place 1 tablet (0.4 mg total) under the tongue every 5 (five) minutes as needed for chest pain. 03/23/15  Yes Josue Hector, MD  omeprazole (PRILOSEC) 20 MG capsule Take 1 capsule (20 mg total) by mouth daily. 01/11/15  Yes [name], MD  simvastatin (ZOCOR) 80 MG tablet Take 0.5 tablets (40 mg total) by mouth at bedtime. 08/23/15  Yes [name], MD  VENTOLIN HFA 108 (90  Base) MCG/ACT inhaler INHALE 2 PUFFS BY MOUTH EVERY 4 TO 6 HOURS AS NEEDED FOR WHEEZING. 09/02/15  Yes [name], MD    Physical Exam: Filed Vitals:   11/11/15 2100 11/11/15 2115 11/11/15 2130 11/11/15 2200  BP: 144/36   140/80  Pulse: 101 101 101 106  Temp:      TempSrc:      Resp: '24 26 31 26  '$ Height:      Weight:      SpO2: 96% 95% 91% 91%   General: Not in acute distress HEENT:       Eyes: PERRL, EOMI, no scleral icterus or conjunctival pallor.       ENT: No discharge from the ears or nose, no pharyngeal ulcers, petechiae or exudate.        Neck: No JVD, no bruit, no appreciable mass Heme: No cervical adenopathy, no pallor Cardiac: Rate ~110 and regular, No murmurs, No gallops or rubs. Pulm: Good air movement bilaterally. Wheezes bilaterally, expectoration of bright red blood observed Abd: Soft, nondistended, nontender, no rebound pain or gaurding, BS present. Ext: No LE edema bilaterally. 2+DP/PT pulse bilaterally. Musculoskeletal: No gross deformity, no red, hot, swollen joints  Skin: No rashes or wounds on exposed surfaces  Neuro: Alert, oriented X3, cranial nerves II-XII grossly intact. No focal findings Psych: Patient is not overtly psychotic, appropriate mood and affect.   Labs on Admission:  Basic Metabolic Panel:  Recent Labs Lab 11/11/15 2019 11/11/15 2041  NA 133* 136  K 4.0 4.0  CL 95* 94*  CO2 30  --   GLUCOSE 133* 131*  BUN 19 19  CREATININE 1.43* 1.40*  CALCIUM 8.7*  --    Liver Function Tests:  Recent Labs Lab 11/11/15 2019  AST 22  ALT 16*  ALKPHOS 79  BILITOT 0.5  PROT 7.9  ALBUMIN 3.5   No results for input(s): LIPASE, AMYLASE in the last 168 hours. No results for input(s): AMMONIA in the last 168 hours. CBC:  Recent Labs Lab 11/11/15 2019 11/11/15 2041  WBC 11.7*  --   NEUTROABS 8.7*  --   HGB 11.6* 13.6  HCT 36.2* 40.0  MCV 88.3  --   PLT 195  --    Cardiac Enzymes: No results for input(s): CKTOTAL, CKMB,  CKMBINDEX, TROPONINI in the last 168 hours.  BNP (last 3 results) No results for input(s): BNP in the  last 8760 hours.  ProBNP (last 3 results) No results for input(s): PROBNP in the last 8760 hours.  CBG: No results for input(s): GLUCAP in the last 168 hours.  Radiological Exams on Admission: Ct Angio Chest Pe W/cm &/or Wo Cm  11/11/2015  CLINICAL DATA:  Hemoptysis. Masslike opacity in the right upper lobe on chest radiograph from earlier today. EXAM: CT ANGIOGRAPHY CHEST WITH CONTRAST TECHNIQUE: Multidetector CT imaging of the chest was performed using the standard protocol during bolus administration of intravenous contrast. Multiplanar CT image reconstructions and MIPs were obtained to evaluate the vascular anatomy. CONTRAST:  132m OMNIPAQUE IOHEXOL 350 MG/ML SOLN COMPARISON:  Chest radiograph from earlier today. FINDINGS: Mediastinum/Nodes: The study is high quality for the evaluation of pulmonary embolism. There are no filling defects in the central, lobar, segmental or subsegmental pulmonary artery branches to suggest acute pulmonary embolism. Great vessels are normal in course and caliber. Normal heart size. No pericardial fluid/thickening. Coronary atherosclerosis status post CABG with left internal mammary and ascending aortic bypass grafts. Normal visualized thyroid. Normal esophagus. No axillary adenopathy. There a few mildly enlarged right paratracheal nodes, largest 1.4 cm (series 4/ image 32). There is a mildly enlarged 1.5 cm subcarinal node. There is a mildly enlarged 1.0 cm left upper prevascular node (series 4/ image 23). There is a mildly enlarged 1.4 cm right hilar node. No left hilar adenopathy. Lungs/Pleura: No pneumothorax. No pleural effusion. There are inspissated secretions layering throughout the lower tracheal and bilateral mainstem bronchi. There is a 5.3 x 2.9 cm lung mass in the anterior right upper lobe (series 5/ image 19), which demonstrates broad pleural attachment,  with mild patchy ground-glass opacity surrounding the mass. There are mild patchy centrilobular ground-glass nodules in both lungs, most prominent in the right lower lobe and left upper lobe. No additional significant solid pulmonary nodules. Upper abdomen: Unremarkable. Musculoskeletal: No aggressive appearing focal osseous lesions. Moderate degenerative changes in the thoracic spine. Sternotomy wires appear aligned and intact. Review of the MIP images confirms the above findings. IMPRESSION: 1. No pulmonary embolism. 2. 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 This mass abuts the anterior visceral pleura along a broad base. No pleural effusion. 3. Right hilar and subcarinal, right paratracheal and left upper prevascular mediastinal lymphadenopathy, suspicious for nodal metastases. 4. Recommend thoracic surgical consultation and further evaluation with PET-CT. 5. Inspissated secretions in the central airways. Mild patchy ground-glass centrilobular nodularity throughout both lungs, favor aspiration or airway hemorrhage given the history of hemoptysis. Electronically Signed   By: JIlona SorrelM.D.   On: 11/11/2015 22:04   Dg Chest Portable 1 View  11/11/2015  CLINICAL DATA:  Hemoptysis. EXAM: PORTABLE CHEST 1 VIEW COMPARISON:  08/25/2014 chest radiograph. FINDINGS: Sternotomy wires appear aligned and intact. CABG clips overlie the mediastinum. Stable cardiomediastinal silhouette with top-normal heart size. No pneumothorax. No pleural effusion. There is a new focal masslike opacity in the medial right upper lobe. No pulmonary edema. IMPRESSION: New focal masslike lung opacity in the medial right upper lobe, which is nonspecific and could represent a lung mass or focal infection. Recommend further evaluation with chest CT. Electronically Signed   By: JIlona SorrelM.D.   On: 11/11/2015 20:45    EKG: Independently reviewed.  Abnormal findings:  Sinus  tachycardia (rate 103), RBBB, LaFB, Q-waves inferolateral leads  Assessment/Plan  1. Massive hemoptysis with acute blood-loss anemia  - Suspected secondary to new right lung mass  -  Has now expectorated ~400 mL fresh blood in ED over course of ~4 hrs  - Hgb has fallen from 13.6 to 11.0 in 4 hrs  - Type and screen done, 2 units requested for immediate transfusion  - Pt encouraged to lay on right side to prevent blood from entering the left lung (bleeding likely from mass in right lung)   - ASA and Plavix will be held; no pharmacologic VTE ppx  - Currently saturating mid-90s on 2 Lpm, and protecting airway - Case discussed with Dr. Vaughan Browner of PCCM who has kindly accepted the pt to ICU at [facility]    2. Lung mass  - Suspicious for bronchogenic carcinoma in pt with 40 pack-yr smoking hx (quit in '96)  - Hilar and mediastinal LAD concerning for nodal mets  - Will likely require surgical consultation, PET for diagnosis  - Complicated by massive hemoptysis as above    3. Cough, wheeze, dyspnea  - Secondary to hemoptysis, which will be managed as above  - Will trial DuoNeb for apparent bronchospasm, likely secondary to blood   4. CAD, ischemic cardiomyopathy  - CABG in 1996 with stent placed to saphenous graft in Summer '15  - ASA and Plavix currently on hold for active bleeding  - Continue statin  - Holding ACEi and beta-blocker in setting of active bleed and concern for possible progression to shock  - No chest pain or acute EKG findings  - Will need to keep O2 near 100% if significantly anemic   5. CKD stage III  - SCr 1.43 on admission, c/w his apparent baseline  - Avoid nephrotoxins where feasible; avoid BP fluctuations   6. Hyponatremia  - Mild, HCTZ may be responsible  - NS is currently running - Anticipate resolution with IVF and suspension of HCTZ  - Repeat chem panel in am    7. Leukocytosis  - Suspected secondary to stress demargination in setting of bleed; malignancy  may also be contributing  - No other suggestion of infectious process  - Watching off abx; will culture if fever develops     DVT ppx:  SCDs  Code Status: Full code Family Communication:   Yes, patient's wife and son at bed side Disposition Plan: Admit to inpatient   Date of Service 11/11/2015    Vianne Bulls, MD [facility] Pager [phone]  If 7PM-7AM, please contact night-coverage [email or web address] Password [facility] 11/11/2015, 10:29 PM

## 2015-11-11 NOTE — ED Notes (Addendum)
Pts sats 90-91% on room air . States no difficulty breathing until he attempts to cough up up sputum. O2 at 2l/min placed. HR 109  Pt has order for prn duoneb . Audible crackles. Called respiratory to administer tx. Dr. Viviana Simpler notified

## 2015-11-11 NOTE — ED Provider Notes (Signed)
CSN: [id number]     Arrival date & time 11/11/15  1948 History   First MD Initiated Contact with Patient 11/11/15 2010     Chief Complaint  Patient presents with  . Hemoptysis     (Consider location/radiation/quality/duration/timing/severity/associated sxs/prior Treatment) HPI 75 year old male who presents with hemoptysis. He has a history of COPD, CAD status post CABG, coronary angioplasty, aortic graft stent, abdominal aortic aneurysm, hypertension, hyperlipidemia. No prior history of hemoptysis and states that developed hemoptysis this evening at around 5 PM. Developed about a coffee cup full of blood. Has not had any increasing shortness of breath or chest pain. Denies any recent fevers, coughing, congestion, difficulty breathing, lower extremity edema or pain, syncope or near syncope. Past Medical History  Diagnosis Date  . 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 AAA (abdominal aortic aneurysm) ([place]) 2010    4.4 cm 08/2008;4.44 in 7/10 and 4.65 in 08/2009; 4.8 by CT in 11/2009; 4.3 by ultrasound in 08/2010  . Hypertension   . Hyperlipidemia   . Tobacco abuse, in remission     40 pack year total consumption; discontinued in 1996  . GERD (gastroesophageal reflux disease)   . Right bundle branch block   . Diverticulosis   . Colonic polyp 2002    polypectomy in 2002  . COPD (chronic obstructive pulmonary disease) ([place])   . CAD (coronary artery disease)     03/18/14:  PCI with DES to distal left main. 7/29: DES to the SVG to Diag  . ED (erectile dysfunction)   . IFG (impaired fasting glucose)   . Chronic rhinitis   . Myocardial infarction [facility])     "told h/o silent MI sometime before 1996"  . Pneumonia ~ 2001; ~ 2005  . Chronic bronchitis ([place])   . Arthritis     "fingers" (03/18/2014)  . Cardiomyopathy, ischemic     Echo 03/17/14: EF 45-50%   Past Surgical History  Procedure Laterality Date  . Colonoscopy  2002    polypectomy-patient denies  .  Laparoscopic cholecystectomy  12/2009  . Abdominal aortic endovascular stent graft N/A 12/11/2012    Procedure: ABDOMINAL AORTIC ENDOVASCULAR STENT GRAFT;  Surgeon: Mal Misty, MD;  Location: [place];  Service: Vascular;  Laterality: N/A;  Ultrasound guided; [place]  . Abdominal aortic aneurysm repair  11/2012  . Total hip arthroplasty Left 01/21/2013    Procedure: TOTAL HIP ARTHROPLASTY ANTERIOR APPROACH;  Surgeon: Mauri Pole, MD;  Location: [place];  Service: Orthopedics;  Laterality: Left;  . Joint replacement    . Coronary artery bypass graft  01/09/1995    "CABG X3"  . Cardiac catheterization  01/08/1995  . Coronary angioplasty with stent placement  03/18/2014    "1"  . Coronary angioplasty with stent placement  03/24/2014    "1"  . Left and right heart catheterization with coronary/graft angiogram N/A 03/18/2014    Procedure: LEFT AND RIGHT HEART CATHETERIZATION WITH Beatrix Fetters;  Surgeon: Blane Ohara, MD;  Location: [facility] CATH LAB;  Service: Cardiovascular;  Laterality: N/A;  . Percutaneous coronary stent intervention (pci-s)  03/18/2014    Procedure: PERCUTANEOUS CORONARY STENT INTERVENTION (PCI-S);  Surgeon: Blane Ohara, MD;  Location: [facility] CATH LAB;  Service: Cardiovascular;;  . Percutaneous coronary stent intervention (pci-s) N/A 03/24/2014    Procedure: PERCUTANEOUS CORONARY STENT INTERVENTION (PCI-S);  Surgeon: Blane Ohara, MD;  Location: [facility] CATH LAB;  Service: Cardiovascular;  Laterality: N/A;   Family History  Problem Relation Age  of Onset  . Colon cancer Neg Hx   . Colon polyps Neg Hx   . Heart disease Father   . Arthritis Mother   . Parkinsonism Mother   . Arthritis Sister     Brother with rheumatoid arthritis  . Cancer Father     Lung  . Hypertension Brother    Social History  Substance Use Topics  . Smoking status: Former Smoker -- 1.50 packs/day for 30 years    Types: Cigarettes    Start date: 12/01/1956    Quit date: 01/08/1995  . Smokeless  tobacco: Never Used  . Alcohol Use: 0.0 oz/week    0 Standard drinks or equivalent per week     Comment: 03/18/2014 "no alacohol since 1996"    Review of Systems 10/14 systems reviewed and are negative other than those stated in the HPI   Allergies  Neomycin  Home Medications   Prior to Admission medications   Medication Sig Start Date End Date Taking? Authorizing Provider  acetaminophen (TYLENOL) 500 MG tablet Take 500 mg by mouth every 6 (six) hours as needed for mild pain.   Yes Historical Provider, MD  aspirin EC 81 MG tablet Take 81 mg by mouth daily.   Yes Historical Provider, MD  clopidogrel (PLAVIX) 75 MG tablet TAKE ONE TABLET BY MOUTH DAILY WITH BREAKFAST. 03/23/15  Yes Josue Hector, MD  enalapril (VASOTEC) 10 MG tablet Take one and a half tablets PO BID 08/23/15  Yes [name], MD  Ferrous Sulfate (IRON) 28 MG TABS Take 28 mg by mouth daily.   Yes Historical Provider, MD  hydrochlorothiazide (HYDRODIURIL) 25 MG tablet Take 0.5 tablets (12.5 mg total) by mouth daily. 03/19/14  Yes Brett Canales, PA-C  metoprolol (LOPRESSOR) 50 MG tablet Take 50 mg by mouth 2 (two) times daily.    Yes Historical Provider, MD  Multiple Vitamins-Minerals (CENTRUM SILVER ADULT 50+) TABS Take 1 tablet by mouth daily.    Yes Historical Provider, MD  nitroGLYCERIN (NITROSTAT) 0.4 MG SL tablet Place 1 tablet (0.4 mg total) under the tongue every 5 (five) minutes as needed for chest pain. 03/23/15  Yes Josue Hector, MD  omeprazole (PRILOSEC) 20 MG capsule Take 1 capsule (20 mg total) by mouth daily. 01/11/15  Yes [name], MD  simvastatin (ZOCOR) 80 MG tablet Take 0.5 tablets (40 mg total) by mouth at bedtime. 08/23/15  Yes [name], MD  VENTOLIN HFA 108 (90 Base) MCG/ACT inhaler INHALE 2 PUFFS BY MOUTH EVERY 4 TO 6 HOURS AS NEEDED FOR WHEEZING. 09/02/15  Yes [name], MD   BP 140/80 mmHg  Pulse 106  Temp(Src) 98.1 F (36.7 C) (Oral)  Resp 26  Ht '5\' 10"'$  (1.778 m)  Wt  188 lb (85.276 kg)  BMI 26.98 kg/m2  SpO2 91% Physical Exam Physical Exam  Nursing note and vitals reviewed. Constitutional: Well developed, well nourished, non-toxic, and in no acute distress Head: Normocephalic and atraumatic.  Mouth/Throat: Oropharynx is clear and moist.  Neck: Normal range of motion. Neck supple.  Cardiovascular: Intermittently tachycardic rate and regular rhythm.  No edema. Pulmonary/Chest: Effort normal. Scattered wheezes and coarse breath sounds. No conversational dyspnea Abdominal: Soft. There is no tenderness. There is no rebound and no guarding.  Musculoskeletal: Normal range of motion.  Neurological: Alert, no facial droop, fluent speech, moves all extremities symmetrically Skin: Skin is warm and dry.  Psychiatric: Cooperative  ED Course  Procedures (including critical care time) Labs Review  Labs Reviewed  CBC WITH DIFFERENTIAL/PLATELET - Abnormal; Notable for the following:    WBC 11.7 (*)    RBC 4.10 (*)    Hemoglobin 11.6 (*)    HCT 36.2 (*)    Neutro Abs 8.7 (*)    All other components within normal limits  COMPREHENSIVE METABOLIC PANEL - Abnormal; Notable for the following:    Sodium 133 (*)    Chloride 95 (*)    Glucose, Bld 133 (*)    Creatinine, Ser 1.43 (*)    Calcium 8.7 (*)    ALT 16 (*)    GFR calc non Af Amer 46 (*)    GFR calc Af Amer 54 (*)    All other components within normal limits  I-STAT CHEM 8, ED - Abnormal; Notable for the following:    Chloride 94 (*)    Creatinine, Ser 1.40 (*)    Glucose, Bld 131 (*)    Calcium, Ion 1.11 (*)    All other components within normal limits  PROTIME-INR  APTT  TYPE AND SCREEN    Imaging Review Ct Angio Chest Pe W/cm &/or Wo Cm  11/11/2015  CLINICAL DATA:  Hemoptysis. Masslike opacity in the right upper lobe on chest radiograph from earlier today. EXAM: CT ANGIOGRAPHY CHEST WITH CONTRAST TECHNIQUE: Multidetector CT imaging of the chest was performed using the standard protocol during  bolus administration of intravenous contrast. Multiplanar CT image reconstructions and MIPs were obtained to evaluate the vascular anatomy. CONTRAST:  190m OMNIPAQUE IOHEXOL 350 MG/ML SOLN COMPARISON:  Chest radiograph from earlier today. FINDINGS: Mediastinum/Nodes: The study is high quality for the evaluation of pulmonary embolism. There are no filling defects in the central, lobar, segmental or subsegmental pulmonary artery branches to suggest acute pulmonary embolism. Great vessels are normal in course and caliber. Normal heart size. No pericardial fluid/thickening. Coronary atherosclerosis status post CABG with left internal mammary and ascending aortic bypass grafts. Normal visualized thyroid. Normal esophagus. No axillary adenopathy. There a few mildly enlarged right paratracheal nodes, largest 1.4 cm (series 4/ image 32). There is a mildly enlarged 1.5 cm subcarinal node. There is a mildly enlarged 1.0 cm left upper prevascular node (series 4/ image 23). There is a mildly enlarged 1.4 cm right hilar node. No left hilar adenopathy. Lungs/Pleura: No pneumothorax. No pleural effusion. There are inspissated secretions layering throughout the lower tracheal and bilateral mainstem bronchi. There is a 5.3 x 2.9 cm lung mass in the anterior right upper lobe (series 5/ image 19), which demonstrates broad pleural attachment, with mild patchy ground-glass opacity surrounding the mass. There are mild patchy centrilobular ground-glass nodules in both lungs, most prominent in the right lower lobe and left upper lobe. No additional significant solid pulmonary nodules. Upper abdomen: Unremarkable. Musculoskeletal: No aggressive appearing focal osseous lesions. Moderate degenerative changes in the thoracic spine. Sternotomy wires appear aligned and intact. Review of the MIP images confirms the above findings. IMPRESSION: 1. No pulmonary embolism. 2. 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 This mass abuts the anterior visceral pleura along a broad base. No pleural effusion. 3. Right hilar and subcarinal, right paratracheal and left upper prevascular mediastinal lymphadenopathy, suspicious for nodal metastases. 4. Recommend thoracic surgical consultation and further evaluation with PET-CT. 5. Inspissated secretions in the central airways. Mild patchy ground-glass centrilobular nodularity throughout both lungs, favor aspiration or airway hemorrhage given the history of hemoptysis. Electronically Signed   By: JIlona Sorrel  M.D.   On: 11/11/2015 22:04   Dg Chest Portable 1 View  11/11/2015  CLINICAL DATA:  Hemoptysis. EXAM: PORTABLE CHEST 1 VIEW COMPARISON:  08/25/2014 chest radiograph. FINDINGS: Sternotomy wires appear aligned and intact. CABG clips overlie the mediastinum. Stable cardiomediastinal silhouette with top-normal heart size. No pneumothorax. No pleural effusion. There is a new focal masslike opacity in the medial right upper lobe. No pulmonary edema. IMPRESSION: New focal masslike lung opacity in the medial right upper lobe, which is nonspecific and could represent a lung mass or focal infection. Recommend further evaluation with chest CT. Electronically Signed   By: Ilona Sorrel M.D.   On: 11/11/2015 20:45   I have personally reviewed and evaluated these images and lab results as part of my medical decision-making.   EKG Interpretation   Date/Time:  Friday November 11 2015 20:37:06 EDT Ventricular Rate:  103 PR Interval:  147 QRS Duration: 154 QT Interval:  366 QTC Calculation: 479 R Axis:   98 Text Interpretation:  Sinus tachycardia RBBB and LPFB Inferior infarct,  age indeterminate Abnormal lateral Q waves lateral and inferior ST  depression seen on prior EKG  Confirmed by Haizlee Henton MD, Hinton Dyer ([id number]) on  11/11/2015 8:57:45 PM      MDM   Final diagnoses:  Hemoptysis  Mass of lung    75 year old male who presents with hemoptysis. He is  nontoxic in no acute distress, with some mild tachycardia in the 90s to low 100s, but normotensive and on room air with no respiratory distress. He does intermittently have active small volume hemoptysis of bright red blood with blood clots. Chest x-ray revealing new right upper lobe mass. CT scan performed revealing mass concerning for bronchogenic carcinoma. Likely the etiology of his hemoptysis. Hg 11 (baseline 13 in 01/2015). No coagulopathy. Discussed with Dr. Kimber Relic from pulmonology, who recommended admission to medicine service and transfer to [facility] for bronchoscopy and other work-up. Accepted to hospitalist service by Dr. Myna Hidalgo.    Forde Dandy, MD 11/11/15 2227

## 2015-11-12 DIAGNOSIS — Z8261 Family history of arthritis: Secondary | ICD-10-CM | POA: Diagnosis not present

## 2015-11-12 DIAGNOSIS — Z86010 Personal history of colonic polyps: Secondary | ICD-10-CM | POA: Diagnosis not present

## 2015-11-12 DIAGNOSIS — R042 Hemoptysis: Secondary | ICD-10-CM | POA: Diagnosis not present

## 2015-11-12 LAB — TROPONIN I
TROPONIN I: 0.16 ng/mL — AB (ref <0.031)
TROPONIN I: 0.25 ng/mL — AB (ref <0.031)
TROPONIN I: 0.2 ng/mL — AB (ref <0.031)
Troponin I: 0.23 ng/mL — ABNORMAL HIGH (ref <0.031)
Troponin I: 0.25 ng/mL — ABNORMAL HIGH (ref <0.031)
Troponin I: 0.2 ng/mL — ABNORMAL HIGH (ref <0.031)

## 2015-11-12 LAB — CBC
HEMATOCRIT: 35 % — AB (ref 39.0–52.0)
HEMOGLOBIN: 11.7 g/dL — AB (ref 13.0–17.0)
MCH: 29.1 pg (ref 26.0–34.0)
MCHC: 33.4 g/dL (ref 30.0–36.0)
MCV: 87.1 fL (ref 78.0–100.0)
Platelets: 177 10*3/uL (ref 150–400)
RBC: 4.02 MIL/uL — ABNORMAL LOW (ref 4.22–5.81)
RDW: 13.8 % (ref 11.5–15.5)
WBC: 11.3 10*3/uL — ABNORMAL HIGH (ref 4.0–10.5)

## 2015-11-12 LAB — HEMOGLOBIN: HEMOGLOBIN: 11 g/dL — AB (ref 13.0–17.0)

## 2015-11-12 LAB — BASIC METABOLIC PANEL
ANION GAP: 10 (ref 5–15)
BUN: 21 mg/dL — ABNORMAL HIGH (ref 6–20)
CHLORIDE: 100 mmol/L — AB (ref 101–111)
CO2: 27 mmol/L (ref 22–32)
CREATININE: 1.34 mg/dL — AB (ref 0.61–1.24)
Calcium: 8.9 mg/dL (ref 8.9–10.3)
GFR calc non Af Amer: 50 mL/min — ABNORMAL LOW (ref >60)
GFR, EST AFRICAN AMERICAN: 58 mL/min — AB (ref >60)
Glucose, Bld: 115 mg/dL — ABNORMAL HIGH (ref 65–99)
POTASSIUM: 4.3 mmol/L (ref 3.5–5.1)
Sodium: 137 mmol/L (ref 135–145)

## 2015-11-12 LAB — LACTIC ACID, PLASMA: LACTIC ACID, VENOUS: 0.9 mmol/L (ref 0.5–2.0)

## 2015-11-12 LAB — MRSA PCR SCREENING: MRSA by PCR: NEGATIVE

## 2015-11-12 LAB — HEMATOCRIT: HEMATOCRIT: 34.5 % — AB (ref 39.0–52.0)

## 2015-11-12 LAB — PHOSPHORUS: PHOSPHORUS: 2.7 mg/dL (ref 2.5–4.6)

## 2015-11-12 LAB — PROCALCITONIN: Procalcitonin: <0.1 ng/mL

## 2015-11-12 LAB — MAGNESIUM: Magnesium: 1.9 mg/dL (ref 1.7–2.4)

## 2015-11-12 LAB — GLUCOSE, CAPILLARY: Glucose-Capillary: 80 mg/dL (ref 65–99)

## 2015-11-12 MED ORDER — SODIUM CHLORIDE 0.9 % IV SOLN: Freq: Once | INTRAVENOUS | Status: AC

## 2015-11-12 MED ORDER — ENALAPRIL MALEATE 10 MG PO TABS
15.0000 mg | ORAL_TABLET | Freq: Two times a day (BID) | ORAL | Status: DC
  Administered 2015-11-12 – 2015-11-14 (×4): 15 mg via ORAL
  Filled 2015-11-12 (×4): qty 2

## 2015-11-12 MED ORDER — METOPROLOL TARTRATE 50 MG PO TABS
50.0000 mg | ORAL_TABLET | Freq: Two times a day (BID) | ORAL | Status: DC
  Administered 2015-11-12 – 2015-11-14 (×4): 50 mg via ORAL
  Filled 2015-11-12 (×4): qty 1

## 2015-11-12 MED ORDER — HYDROCHLOROTHIAZIDE 25 MG PO TABS
12.5000 mg | ORAL_TABLET | Freq: Every day | ORAL | Status: DC
  Administered 2015-11-12 – 2015-11-14 (×3): 12.5 mg via ORAL
  Filled 2015-11-12 (×3): qty 1

## 2015-11-12 NOTE — Progress Notes (Signed)
eLink Physician-Brief Progress Note Patient Name: MASE DHONDT DOB: Nov 23, 1940 MRN: [id number]   Date of Service  11/12/2015  HPI/Events of Note  9 beat run of v tach. BP stable, lytes wnl  eICU Interventions  Monitor     Intervention Category Intermediate Interventions: Other:  Alvy Alsop 11/12/2015, 6:37 AM

## 2015-11-12 NOTE — H&P (Addendum)
PULMONARY / CRITICAL CARE MEDICINE   [name] MRN: [id number] DOB: 25-Jul-1941    ADMISSION DATE:  11/11/2015 CONSULTATION DATE:  11/11/15  REFERRING MD:  [facility]  CHIEF COMPLAINT:  Hemoptysis  HISTORY OF PRESENT ILLNESS:   Paul Sandoval is a 75 Y/O with PMH of HTN, HLD, CAD s/p CABG, COPD. He was in his usual state of health yesterday when he developed hemoptysis starting at 5 pm. He brought up approximately a cup full of blood. He denies any preceding symptoms of cough, dyspnea, wheezing, sputum production, fevers or chills.   [facility] went to [facility] where he continued to have hemoptysis of 200-300cc with a Hb drop from 13.6 to 11. He remained hemodynamically stable and able to protect airway. He was initially booked for hospitalist team and SDU but due to lack of beds and continued hemoptysis PCCM was called and he was upgraded to ICU.  PAST MEDICAL HISTORY :  He  has a past medical history of Arteriosclerotic cardiovascular disease (ASCVD) (1996); AAA (abdominal aortic aneurysm) ([place]) (2010); Hypertension; Hyperlipidemia; Tobacco abuse, in remission; GERD (gastroesophageal reflux disease); Right bundle branch block; Diverticulosis; Colonic polyp (2002); COPD (chronic obstructive pulmonary disease) ([place]); CAD (coronary artery disease); ED (erectile dysfunction); IFG (impaired fasting glucose); Chronic rhinitis; Myocardial infarction ([place]); Pneumonia (~ 2001; ~ 2005); Chronic bronchitis ([place]); Arthritis; and Cardiomyopathy, ischemic.  PAST SURGICAL HISTORY: He  has past surgical history that includes Colonoscopy (2002); Laparoscopic cholecystectomy (12/2009); Abdominal aortic endovascular stent graft (N/A, 12/11/2012); Abdominal aortic aneurysm repair (11/2012); Total hip arthroplasty (Left, 01/21/2013); Joint replacement; Coronary artery bypass graft (01/09/1995); Cardiac catheterization (01/08/1995); Coronary angioplasty with stent (03/18/2014); Coronary angioplasty with stent (03/24/2014);  left and right heart catheterization with coronary/graft angiogram (N/A, 03/18/2014); percutaneous coronary stent intervention (pci-s) (03/18/2014); and percutaneous coronary stent intervention (pci-s) (N/A, 03/24/2014).  Allergies  Allergen Reactions  . Neomycin     Hives     No current facility-administered medications on file prior to encounter.   Current Outpatient Prescriptions on File Prior to Encounter  Medication Sig  . acetaminophen (TYLENOL) 500 MG tablet Take 500 mg by mouth every 6 (six) hours as needed for mild pain.  Marland Kitchen aspirin EC 81 MG tablet Take 81 mg by mouth daily.  . clopidogrel (PLAVIX) 75 MG tablet TAKE ONE TABLET BY MOUTH DAILY WITH BREAKFAST.  Marland Kitchen enalapril (VASOTEC) 10 MG tablet Take one and a half tablets PO BID  . Ferrous Sulfate (IRON) 28 MG TABS Take 28 mg by mouth daily.  . hydrochlorothiazide (HYDRODIURIL) 25 MG tablet Take 0.5 tablets (12.5 mg total) by mouth daily.  . metoprolol (LOPRESSOR) 50 MG tablet Take 50 mg by mouth 2 (two) times daily.   . Multiple Vitamins-Minerals (CENTRUM SILVER ADULT 50+) TABS Take 1 tablet by mouth daily.   . nitroGLYCERIN (NITROSTAT) 0.4 MG SL tablet Place 1 tablet (0.4 mg total) under the tongue every 5 (five) minutes as needed for chest pain.  Marland Kitchen omeprazole (PRILOSEC) 20 MG capsule Take 1 capsule (20 mg total) by mouth daily.  . simvastatin (ZOCOR) 80 MG tablet Take 0.5 tablets (40 mg total) by mouth at bedtime.  . VENTOLIN HFA 108 (90 Base) MCG/ACT inhaler INHALE 2 PUFFS BY MOUTH EVERY 4 TO 6 HOURS AS NEEDED FOR WHEEZING.    FAMILY HISTORY:  His indicated that his mother is deceased. He indicated that his father is deceased. He indicated that his sister is alive. He indicated that his brother is alive.   SOCIAL  HISTORY: He  reports that he quit smoking about 20 years ago. His smoking use included Cigarettes. He started smoking about 58 years ago. He has a 45 pack-year smoking history. He has never used smokeless tobacco. He  reports that he drinks alcohol. He reports that he does not use illicit drugs.  REVIEW OF SYSTEMS:   Denies any cough, sputum, dyspnea, wheezing,  + Hemoptysis No fevers, chills, malaise, fatigue, loss of weight, appetite No chest pain, palpitations, No dizziness, syncope, weakness, numbness, tingling No nausea, vomiting, diarrhea, constipation All other ROS are negative.  SUBJECTIVE:   VITAL SIGNS: BP 126/58 mmHg  Pulse 79  Temp(Src) 98.6 F (37 C) (Oral)  Resp 22  Ht '5\' 10"'$  (1.778 m)  Wt 85.6 kg (188 lb 11.4 oz)  BMI 27.08 kg/m2  SpO2 95%  HEMODYNAMICS:    VENTILATOR SETTINGS:    INTAKE / OUTPUT:    PHYSICAL EXAMINATION: General:  Elderly man lying in bed in NAD Neuro:  Awake, alert, grossly normal. HEENT:  No bleed in oropharynx. Cardiovascular:  Heart sounds dual and normal Lungs:  CTAB. Abdomen:  Soft, nontender Musculoskeletal:  No swollen joints Skin:  No rashes on exposed skin  LABS:  BMET  Recent Labs Lab 11/11/15 2019 11/11/15 2041 11/12/15 0357  NA 133* 136 137  K 4.0 4.0 4.3  CL 95* 94* 100*  CO2 30  --  27  BUN 19 19 21*  CREATININE 1.43* 1.40* 1.34*  GLUCOSE 133* 131* 115*    Electrolytes  Recent Labs Lab 11/11/15 2019 11/12/15 0357  CALCIUM 8.7* 8.9  MG  --  1.9  PHOS  --  2.7    CBC  Recent Labs Lab 11/11/15 2019 11/11/15 2041 11/11/15 2345 11/12/15 0357  WBC 11.7*  --   --  11.3*  HGB 11.6* 13.6 11.0* 11.7*  HCT 36.2* 40.0 34.5* 35.0*  PLT 195  --   --  177    Coag's  Recent Labs Lab 11/11/15 2019  APTT 32  INR 1.04    Sepsis Markers  Recent Labs Lab 11/12/15 0357  LATICACIDVEN 0.9    ABG No results for input(s): PHART, PCO2ART, PO2ART in the last 168 hours.  Liver Enzymes  Recent Labs Lab 11/11/15 2019  AST 22  ALT 16*  ALKPHOS 79  BILITOT 0.5  ALBUMIN 3.5    Cardiac Enzymes  Recent Labs Lab 11/12/15 0357  TROPONINI 0.16*    Glucose No results for input(s): GLUCAP in the  last 168 hours.  Imaging Ct Angio Chest Pe W/cm &/or Wo Cm  11/11/2015  CLINICAL DATA:  Hemoptysis. Masslike opacity in the right upper lobe on chest radiograph from earlier today. EXAM: CT ANGIOGRAPHY CHEST WITH CONTRAST TECHNIQUE: Multidetector CT imaging of the chest was performed using the standard protocol during bolus administration of intravenous contrast. Multiplanar CT image reconstructions and MIPs were obtained to evaluate the vascular anatomy. CONTRAST:  161m OMNIPAQUE IOHEXOL 350 MG/ML SOLN COMPARISON:  Chest radiograph from earlier today. FINDINGS: Mediastinum/Nodes: The study is high quality for the evaluation of pulmonary embolism. There are no filling defects in the central, lobar, segmental or subsegmental pulmonary artery branches to suggest acute pulmonary embolism. Great vessels are normal in course and caliber. Normal heart size. No pericardial fluid/thickening. Coronary atherosclerosis status post CABG with left internal mammary and ascending aortic bypass grafts. Normal visualized thyroid. Normal esophagus. No axillary adenopathy. There a few mildly enlarged right paratracheal nodes, largest 1.4 cm (series 4/ image 32). There is  a mildly enlarged 1.5 cm subcarinal node. There is a mildly enlarged 1.0 cm left upper prevascular node (series 4/ image 23). There is a mildly enlarged 1.4 cm right hilar node. No left hilar adenopathy. Lungs/Pleura: No pneumothorax. No pleural effusion. There are inspissated secretions layering throughout the lower tracheal and bilateral mainstem bronchi. There is a 5.3 x 2.9 cm lung mass in the anterior right upper lobe (series 5/ image 19), which demonstrates broad pleural attachment, with mild patchy ground-glass opacity surrounding the mass. There are mild patchy centrilobular ground-glass nodules in both lungs, most prominent in the right lower lobe and left upper lobe. No additional significant solid pulmonary nodules. Upper abdomen: Unremarkable.  Musculoskeletal: No aggressive appearing focal osseous lesions. Moderate degenerative changes in the thoracic spine. Sternotomy wires appear aligned and intact. Review of the MIP images confirms the above findings. IMPRESSION: 1. No pulmonary embolism. 2. 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 This mass abuts the anterior visceral pleura along a broad base. No pleural effusion. 3. Right hilar and subcarinal, right paratracheal and left upper prevascular mediastinal lymphadenopathy, suspicious for nodal metastases. 4. Recommend thoracic surgical consultation and further evaluation with PET-CT. 5. Inspissated secretions in the central airways. Mild patchy ground-glass centrilobular nodularity throughout both lungs, favor aspiration or airway hemorrhage given the history of hemoptysis. Electronically Signed   By: Ilona Sorrel M.D.   On: 11/11/2015 22:04   Dg Chest Portable 1 View  11/11/2015  CLINICAL DATA:  Hemoptysis. EXAM: PORTABLE CHEST 1 VIEW COMPARISON:  08/25/2014 chest radiograph. FINDINGS: Sternotomy wires appear aligned and intact. CABG clips overlie the mediastinum. Stable cardiomediastinal silhouette with top-normal heart size. No pneumothorax. No pleural effusion. There is a new focal masslike opacity in the medial right upper lobe. No pulmonary edema. IMPRESSION: New focal masslike lung opacity in the medial right upper lobe, which is nonspecific and could represent a lung mass or focal infection. Recommend further evaluation with chest CT. Electronically Signed   By: Ilona Sorrel M.D.   On: 11/11/2015 20:45    STUDIES: Images reviewed. CXR 3/17 > RUL mass CTA 3/17 > 5.3 x 2.9 cm lung mass with rt hilar, subcarinal and mediastinal LNs  CULTURES:  ANTIBIOTICS:  SIGNIFICANT EVENTS: 3/17 > Admit with hemoptysis  LINES/TUBES:  DISCUSSION: 75 Y/O with significant smoking history admitted with hemoptysis. Found to have a RUL  mass with LNs concerning for primary lung cancer.   ASSESSMENT / PLAN:  PULMONARY A: Hemoptysis RUL mass conerning for cancer. P:   Supplemental O2 Monitor hemoptysis Call IR if bleeding worsens Will assess for a bronch after stabilization and ACS is ruled out Tussionex for cough suppression Nebs PRN  CARDIOVASCULAR A:  CAD s/p CABG DES placement in 7/15 Elevated troponin Normal lactic acid P:  Trend troponins Hold ASA and plavix for now Cardiology consult in AM  RENAL A:   CKD, baseline cr ~1.4 P:   Monitor urine output and Cr  GASTROINTESTINAL A:   No issues P:   Keep NPO  HEMATOLOGIC A:   Acute blood loss anemia s/p 1 unit PRBC Normal coags and platelets P:  Follow H/H Transfuse for Hb < 7 or active bleeding.  INFECTIOUS A:   No issues P:   Observe off antibiotics Check Procalcitonin  ENDOCRINE A:   No issues P:    NEUROLOGIC A:   Stable P:    FAMILY  - Updates: Patient and family updated 3/18. -  Inter-disciplinary family meet or Palliative Care meeting due by:  3/25  CRITICAL CARE Performed by: Luz Brazen   Total critical care time: 30 minutes  Critical care time was exclusive of separately billable procedures and treating other patients.  Critical care was necessary to treat or prevent imminent or life-threatening deterioration.  Critical care was time spent personally by me on the following activities: development of treatment plan with patient and/or surrogate as well as nursing, discussions with consultants, evaluation of patient's response to treatment, examination of patient, obtaining history from patient or surrogate, ordering and performing treatments and interventions, ordering and review of laboratory studies, ordering and review of radiographic studies, pulse oximetry and re-evaluation of patient's condition.   Pulmonary and [place] Pager: [phone]  11/12/2015, 5:23 AM

## 2015-11-12 NOTE — Progress Notes (Signed)
Notified MD of 9 beat run of v tach. Pt stable and resting, asymptomatic.

## 2015-11-12 NOTE — ED Notes (Signed)
[facility] called for report

## 2015-11-12 NOTE — ED Notes (Signed)
Hospitalist at the bedside to explain to pt lab results, consent signed

## 2015-11-12 NOTE — ED Notes (Signed)
[facility] has arrived for pt.

## 2015-11-12 NOTE — ED Notes (Addendum)
Called [facility] to check on bed statis, No bed available. MD aware pt will be in holding.

## 2015-11-12 NOTE — Progress Notes (Signed)
PULMONARY / CRITICAL CARE MEDICINE   [name] MRN: [id number] DOB: 28-Jul-1941    ADMISSION DATE:  11/11/2015 CONSULTATION DATE:  11/11/15  REFERRING MD:  [facility]  CHIEF COMPLAINT:  Hemoptysis  HISTORY OF PRESENT ILLNESS:   Mr Paul Sandoval is a 75 Y/O with PMH of HTN, HLD, CAD s/p CABG, COPD. He was in his usual state of health yesterday when he developed hemoptysis starting at 5 pm. He brought up approximately a cup full of blood. He denies any preceding symptoms of cough, dyspnea, wheezing, sputum production, fevers or chills.   [facility] went to [facility] ED where he continued to have hemoptysis of 200-300cc with a Hb drop from 13.6 to 11. He remained hemodynamically stable and able to protect airway. He was initially booked for hospitalist team and SDU but due to lack of beds and continued hemoptysis PCCM was called and he was upgraded to ICU.  PAST MEDICAL HISTORY :  He  has a past medical history of Arteriosclerotic cardiovascular disease (ASCVD) (1996); AAA (abdominal aortic aneurysm) ([place]) (2010); Hypertension; Hyperlipidemia; Tobacco abuse, in remission; GERD (gastroesophageal reflux disease); Right bundle branch block; Diverticulosis; Colonic polyp (2002); COPD (chronic obstructive pulmonary disease) ([place]); CAD (coronary artery disease); ED (erectile dysfunction); IFG (impaired fasting glucose); Chronic rhinitis; Myocardial infarction ([place]); Pneumonia (~ 2001; ~ 2005); Chronic bronchitis ([place]); Arthritis; and Cardiomyopathy, ischemic.  PAST SURGICAL HISTORY: He  has past surgical history that includes Colonoscopy (2002); Laparoscopic cholecystectomy (12/2009); Abdominal aortic endovascular stent graft (N/A, 12/11/2012); Abdominal aortic aneurysm repair (11/2012); Total hip arthroplasty (Left, 01/21/2013); Joint replacement; Coronary artery bypass graft (01/09/1995); Cardiac catheterization (01/08/1995); Coronary angioplasty with stent (03/18/2014); Coronary angioplasty with stent (03/24/2014);  left and right heart catheterization with coronary/graft angiogram (N/A, 03/18/2014); percutaneous coronary stent intervention (pci-s) (03/18/2014); and percutaneous coronary stent intervention (pci-s) (N/A, 03/24/2014).  Allergies  Allergen Reactions  . Neomycin     Hives     No current facility-administered medications on file prior to encounter.   Current Outpatient Prescriptions on File Prior to Encounter  Medication Sig  . acetaminophen (TYLENOL) 500 MG tablet Take 500 mg by mouth every 6 (six) hours as needed for mild pain.  Marland Kitchen aspirin EC 81 MG tablet Take 81 mg by mouth daily.  . clopidogrel (PLAVIX) 75 MG tablet TAKE ONE TABLET BY MOUTH DAILY WITH BREAKFAST.  Marland Kitchen enalapril (VASOTEC) 10 MG tablet Take one and a half tablets PO BID  . Ferrous Sulfate (IRON) 28 MG TABS Take 28 mg by mouth daily.  . hydrochlorothiazide (HYDRODIURIL) 25 MG tablet Take 0.5 tablets (12.5 mg total) by mouth daily.  . metoprolol (LOPRESSOR) 50 MG tablet Take 50 mg by mouth 2 (two) times daily.   . Multiple Vitamins-Minerals (CENTRUM SILVER ADULT 50+) TABS Take 1 tablet by mouth daily.   . nitroGLYCERIN (NITROSTAT) 0.4 MG SL tablet Place 1 tablet (0.4 mg total) under the tongue every 5 (five) minutes as needed for chest pain.  Marland Kitchen omeprazole (PRILOSEC) 20 MG capsule Take 1 capsule (20 mg total) by mouth daily.  . simvastatin (ZOCOR) 80 MG tablet Take 0.5 tablets (40 mg total) by mouth at bedtime.  . VENTOLIN HFA 108 (90 Base) MCG/ACT inhaler INHALE 2 PUFFS BY MOUTH EVERY 4 TO 6 HOURS AS NEEDED FOR WHEEZING.    FAMILY HISTORY:  His indicated that his mother is deceased. He indicated that his father is deceased. He indicated that his sister is alive. He indicated that his brother is alive.   SOCIAL  HISTORY: He  reports that he quit smoking about 20 years ago. His smoking use included Cigarettes. He started smoking about 58 years ago. He has a 45 pack-year smoking history. He has never used smokeless tobacco. He  reports that he drinks alcohol. He reports that he does not use illicit drugs.  REVIEW OF SYSTEMS:   Denies any cough, sputum, dyspnea, wheezing,  + Hemoptysis No fevers, chills, malaise, fatigue, loss of weight, appetite No chest pain, palpitations, No dizziness, syncope, weakness, numbness, tingling No nausea, vomiting, diarrhea, constipation All other ROS are negative.  SUBJECTIVE:  No further hemoptysis since moving up to ICU this am   VITAL SIGNS: BP 127/70 mmHg  Pulse 99  Temp(Src) 98.6 F (37 C) (Oral)  Resp 22  Ht '5\' 10"'$  (1.778 m)  Wt 188 lb 11.4 oz (85.6 kg)  BMI 27.08 kg/m2  SpO2 97%  HEMODYNAMICS:    VENTILATOR SETTINGS:    INTAKE / OUTPUT: I/O last 3 completed shifts: In: 28 [I.V.:375; Blood:335] Out: -   PHYSICAL EXAMINATION: General:  Elderly man up to chair Neuro:  Awake, alert, grossly normal. HEENT:  No bleed in oropharynx. Cardiovascular:  Heart sounds dual and normal Lungs:  CTAB. Abdomen:  Soft, nontender Musculoskeletal:  No swollen joints Skin:  No rashes on exposed skin  LABS:  BMET  Recent Labs Lab 11/11/15 2019 11/11/15 2041 11/12/15 0357  NA 133* 136 137  K 4.0 4.0 4.3  CL 95* 94* 100*  CO2 30  --  27  BUN 19 19 21*  CREATININE 1.43* 1.40* 1.34*  GLUCOSE 133* 131* 115*    Electrolytes  Recent Labs Lab 11/11/15 2019 11/12/15 0357  CALCIUM 8.7* 8.9  MG  --  1.9  PHOS  --  2.7    CBC  Recent Labs Lab 11/11/15 2019 11/11/15 2041 11/11/15 2345 11/12/15 0357  WBC 11.7*  --   --  11.3*  HGB 11.6* 13.6 11.0* 11.7*  HCT 36.2* 40.0 34.5* 35.0*  PLT 195  --   --  177    Coag's  Recent Labs Lab 11/11/15 2019  APTT 32  INR 1.04    Sepsis Markers  Recent Labs Lab 11/12/15 0357 11/12/15 0648  LATICACIDVEN 0.9  --   PROCALCITON  --  <0.10    ABG No results for input(s): PHART, PCO2ART, PO2ART in the last 168 hours.  Liver Enzymes  Recent Labs Lab 11/11/15 2019  AST 22  ALT 16*  ALKPHOS 79   BILITOT 0.5  ALBUMIN 3.5    Cardiac Enzymes  Recent Labs Lab 11/12/15 0357 11/12/15 0648  TROPONINI 0.16* 0.25*  0.23*    Glucose  Recent Labs Lab 11/12/15 0735  GLUCAP 80    Imaging Ct Angio Chest Pe W/cm &/or Wo Cm  11/11/2015  CLINICAL DATA:  Hemoptysis. Masslike opacity in the right upper lobe on chest radiograph from earlier today. EXAM: CT ANGIOGRAPHY CHEST WITH CONTRAST TECHNIQUE: Multidetector CT imaging of the chest was performed using the standard protocol during bolus administration of intravenous contrast. Multiplanar CT image reconstructions and MIPs were obtained to evaluate the vascular anatomy. CONTRAST:  127m OMNIPAQUE IOHEXOL 350 MG/ML SOLN COMPARISON:  Chest radiograph from earlier today. FINDINGS: Mediastinum/Nodes: The study is high quality for the evaluation of pulmonary embolism. There are no filling defects in the central, lobar, segmental or subsegmental pulmonary artery branches to suggest acute pulmonary embolism. Great vessels are normal in course and caliber. Normal heart size. No pericardial fluid/thickening. Coronary atherosclerosis status  post CABG with left internal mammary and ascending aortic bypass grafts. Normal visualized thyroid. Normal esophagus. No axillary adenopathy. There a few mildly enlarged right paratracheal nodes, largest 1.4 cm (series 4/ image 32). There is a mildly enlarged 1.5 cm subcarinal node. There is a mildly enlarged 1.0 cm left upper prevascular node (series 4/ image 23). There is a mildly enlarged 1.4 cm right hilar node. No left hilar adenopathy. Lungs/Pleura: No pneumothorax. No pleural effusion. There are inspissated secretions layering throughout the lower tracheal and bilateral mainstem bronchi. There is a 5.3 x 2.9 cm lung mass in the anterior right upper lobe (series 5/ image 19), which demonstrates broad pleural attachment, with mild patchy ground-glass opacity surrounding the mass. There are mild patchy centrilobular  ground-glass nodules in both lungs, most prominent in the right lower lobe and left upper lobe. No additional significant solid pulmonary nodules. Upper abdomen: Unremarkable. Musculoskeletal: No aggressive appearing focal osseous lesions. Moderate degenerative changes in the thoracic spine. Sternotomy wires appear aligned and intact. Review of the MIP images confirms the above findings. IMPRESSION: 1. No pulmonary embolism. 2. 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 This mass abuts the anterior visceral pleura along a broad base. No pleural effusion. 3. Right hilar and subcarinal, right paratracheal and left upper prevascular mediastinal lymphadenopathy, suspicious for nodal metastases. 4. Recommend thoracic surgical consultation and further evaluation with PET-CT. 5. Inspissated secretions in the central airways. Mild patchy ground-glass centrilobular nodularity throughout both lungs, favor aspiration or airway hemorrhage given the history of hemoptysis. Electronically Signed   By: Ilona Sorrel M.D.   On: 11/11/2015 22:04   Dg Chest Portable 1 View  11/11/2015  CLINICAL DATA:  Hemoptysis. EXAM: PORTABLE CHEST 1 VIEW COMPARISON:  08/25/2014 chest radiograph. FINDINGS: Sternotomy wires appear aligned and intact. CABG clips overlie the mediastinum. Stable cardiomediastinal silhouette with top-normal heart size. No pneumothorax. No pleural effusion. There is a new focal masslike opacity in the medial right upper lobe. No pulmonary edema. IMPRESSION: New focal masslike lung opacity in the medial right upper lobe, which is nonspecific and could represent a lung mass or focal infection. Recommend further evaluation with chest CT. Electronically Signed   By: Ilona Sorrel M.D.   On: 11/11/2015 20:45    STUDIES: Images reviewed. CXR 3/17 > RUL mass CTA 3/17 > 5.3 x 2.9 cm lung mass with rt hilar, subcarinal and mediastinal  LNs  CULTURES:  ANTIBIOTICS:  SIGNIFICANT EVENTS: 3/17 > Admit with hemoptysis  LINES/TUBES:  DISCUSSION: 75 Y/O with significant smoking history admitted with hemoptysis. Found to have a RUL mass with LNs concerning for primary lung cancer.   ASSESSMENT / PLAN:  PULMONARY A: Hemoptysis, appears to have stopped  RUL mass conerning for cancer. P:   Hold plavix / ASA, will need FOB vs needle (FOB favored) after off plavix x 5 days.  Supplemental O2 Tussionex for cough suppression Nebs PRN Follow him for another day, then d/c to home on 3/19 with plans to perform FOB next week  CARDIOVASCULAR A:  CAD s/p CABG DES placement in 7/15 Elevated troponin, peak 0.25 Normal lactic acid P:  Hold ASA and plavix for now  RENAL A:   CKD, baseline cr ~1.4 P:   Monitor urine output and Cr  GASTROINTESTINAL A:   No issues P:   OK to start diet  HEMATOLOGIC A:   Acute blood loss anemia s/p 1 unit PRBC Normal coags and platelets  P:  Follow H/H Transfuse for Hb < 7 or active bleeding.  INFECTIOUS A:   No issues, Pct negative P:   Observe off antibiotics  ENDOCRINE A:   No issues P:    NEUROLOGIC A:   Stable P:    FAMILY  - Updates: Patient and family updated 3/18.  - Inter-disciplinary family meet or Palliative Care meeting due by:  3/25  Baltazar Apo, MD, PhD 11/12/2015, 10:48 AM [facility] [phone] or if no answer [phone]

## 2015-11-12 NOTE — ED Notes (Signed)
Patient ambulatory to restroom with  Assistance, steady gait

## 2015-11-12 NOTE — Progress Notes (Signed)
Notified Dr. Lamonte Sakai, per MD order, of temp exceeding 100.5, new orders given will continue to monitor.

## 2015-11-12 NOTE — ED Notes (Signed)
Pt advised family to go home to rest, Pt sitting up in bed, lights out. New IV started, Infusing without difficulty. Pt continues to cough up blood, Hospitalist aware, AC calling to check for another bed.

## 2015-11-13 ENCOUNTER — Inpatient Hospital Stay (HOSPITAL_COMMUNITY): Payer: Medicare Other

## 2015-11-13 DIAGNOSIS — R918 Other nonspecific abnormal finding of lung field: Secondary | ICD-10-CM

## 2015-11-13 DIAGNOSIS — R042 Hemoptysis: Principal | ICD-10-CM

## 2015-11-13 LAB — TYPE AND SCREEN
ABO/RH(D): O POS
ANTIBODY SCREEN: NEGATIVE
UNIT DIVISION: 0
UNIT DIVISION: 0
Unit division: 0

## 2015-11-13 MED ORDER — ACETAMINOPHEN 500 MG PO TABS
500.0000 mg | ORAL_TABLET | Freq: Four times a day (QID) | ORAL | Status: DC | PRN
  Administered 2015-11-13: 500 mg via ORAL
  Filled 2015-11-13: qty 1

## 2015-11-13 NOTE — Progress Notes (Signed)
[facility]Link Physician-Brief Progress Note Patient [name] DOB: 1940/10/23 MRN: [id number]   Date of Service  11/13/2015  HPI/Events of Note  PT with temp 102  eICU Interventions  Obtain BC and give tylenol   Hold on ABX       Intervention Category Major Interventions: Other:  Asencion Noble 11/13/2015, 9:42 PM

## 2015-11-13 NOTE — H&P (Signed)
PULMONARY / CRITICAL CARE MEDICINE   [name] MRN: [id number] DOB: 10-21-1940    ADMISSION DATE:  11/11/2015 CONSULTATION DATE:  11/11/15  REFERRING MD:  [facility]  CHIEF COMPLAINT:  Hemoptysis  HISTORY OF PRESENT ILLNESS:   Paul Sandoval is a 75 Y/O with PMH of HTN, HLD, CAD s/p CABG, COPD. He was in his usual state of health yesterday when he developed hemoptysis starting at 5 pm. He brought up approximately a cup full of blood. He denies any preceding symptoms of cough, dyspnea, wheezing, sputum production, fevers or chills.   [facility] went to [facility] where he continued to have hemoptysis of 200-300cc with a Hb drop from 13.6 to 11. He remained hemodynamically stable and able to protect airway. He was initially booked for hospitalist team and SDU but due to lack of beds and continued hemoptysis PCCM was called and he was upgraded to ICU.  PAST MEDICAL HISTORY :  He  has a past medical history of Arteriosclerotic cardiovascular disease (ASCVD) (1996); AAA (abdominal aortic aneurysm) ([place]) (2010); Hypertension; Hyperlipidemia; Tobacco abuse, in remission; GERD (gastroesophageal reflux disease); Right bundle branch block; Diverticulosis; Colonic polyp (2002); COPD (chronic obstructive pulmonary disease) ([place]); CAD (coronary artery disease); ED (erectile dysfunction); IFG (impaired fasting glucose); Chronic rhinitis; Myocardial infarction ([place]); Pneumonia (~ 2001; ~ 2005); Chronic bronchitis ([place]); Arthritis; and Cardiomyopathy, ischemic.  PAST SURGICAL HISTORY: He  has past surgical history that includes Colonoscopy (2002); Laparoscopic cholecystectomy (12/2009); Abdominal aortic endovascular stent graft (N/A, 12/11/2012); Abdominal aortic aneurysm repair (11/2012); Total hip arthroplasty (Left, 01/21/2013); Joint replacement; Coronary artery bypass graft (01/09/1995); Cardiac catheterization (01/08/1995); Coronary angioplasty with stent (03/18/2014); Coronary angioplasty with stent (03/24/2014);  left and right heart catheterization with coronary/graft angiogram (N/A, 03/18/2014); percutaneous coronary stent intervention (pci-s) (03/18/2014); and percutaneous coronary stent intervention (pci-s) (N/A, 03/24/2014).  Allergies  Allergen Reactions  . Neomycin     Hives     No current facility-administered medications on file prior to encounter.   Current Outpatient Prescriptions on File Prior to Encounter  Medication Sig  . acetaminophen (TYLENOL) 500 MG tablet Take 500 mg by mouth every 6 (six) hours as needed for mild pain.  Marland Kitchen aspirin EC 81 MG tablet Take 81 mg by mouth daily.  . clopidogrel (PLAVIX) 75 MG tablet TAKE ONE TABLET BY MOUTH DAILY WITH BREAKFAST.  Marland Kitchen enalapril (VASOTEC) 10 MG tablet Take one and a half tablets PO BID  . Ferrous Sulfate (IRON) 28 MG TABS Take 28 mg by mouth daily.  . hydrochlorothiazide (HYDRODIURIL) 25 MG tablet Take 0.5 tablets (12.5 mg total) by mouth daily.  . metoprolol (LOPRESSOR) 50 MG tablet Take 50 mg by mouth 2 (two) times daily.   . Multiple Vitamins-Minerals (CENTRUM SILVER ADULT 50+) TABS Take 1 tablet by mouth daily.   . nitroGLYCERIN (NITROSTAT) 0.4 MG SL tablet Place 1 tablet (0.4 mg total) under the tongue every 5 (five) minutes as needed for chest pain.  Marland Kitchen omeprazole (PRILOSEC) 20 MG capsule Take 1 capsule (20 mg total) by mouth daily.  . simvastatin (ZOCOR) 80 MG tablet Take 0.5 tablets (40 mg total) by mouth at bedtime.  . VENTOLIN HFA 108 (90 Base) MCG/ACT inhaler INHALE 2 PUFFS BY MOUTH EVERY 4 TO 6 HOURS AS NEEDED FOR WHEEZING.    FAMILY HISTORY:  His indicated that his mother is deceased. He indicated that his father is deceased. He indicated that his sister is alive. He indicated that his brother is alive.   SOCIAL  HISTORY: He  reports that he quit smoking about 20 years ago. His smoking use included Cigarettes. He started smoking about 58 years ago. He has a 45 pack-year smoking history. He has never used smokeless tobacco. He  reports that he drinks alcohol. He reports that he does not use illicit drugs.  REVIEW OF SYSTEMS:   Denies any cough, sputum, dyspnea, wheezing,  + Hemoptysis No fevers, chills, malaise, fatigue, loss of weight, appetite No chest pain, palpitations, No dizziness, syncope, weakness, numbness, tingling No nausea, vomiting, diarrhea, constipation All other ROS are negative.  SUBJECTIVE:  No more blood seen although he has had some cough with yellow to green sputum He feels weak and even dizzy when he walks around the room He has not walked in the hallway yet  VITAL SIGNS: BP 149/71 mmHg  Pulse 104  Temp(Src) 98.5 F (36.9 C) (Oral)  Resp 19  Ht '5\' 10"'$  (1.778 m)  Wt 84.369 kg (186 lb)  BMI 26.69 kg/m2  SpO2 100%  HEMODYNAMICS:    VENTILATOR SETTINGS:    INTAKE / OUTPUT: I/O last 3 completed shifts: In: 1744.3 [P.O.:600; I.V.:809.3; Blood:335] Out: -   PHYSICAL EXAMINATION: General:  Elderly man in bed Neuro:  Awake, alert, grossly normal. HEENT:  No bleed in oropharynx. Cardiovascular:  Heart sounds dual and normal Lungs:  CTAB. Abdomen:  Soft, nontender Musculoskeletal:  No swollen joints Skin:  No rashes on exposed skin  LABS:  BMET  Recent Labs Lab 11/11/15 2019 11/11/15 2041 11/12/15 0357  NA 133* 136 137  K 4.0 4.0 4.3  CL 95* 94* 100*  CO2 30  --  27  BUN 19 19 21*  CREATININE 1.43* 1.40* 1.34*  GLUCOSE 133* 131* 115*    Electrolytes  Recent Labs Lab 11/11/15 2019 11/12/15 0357  CALCIUM 8.7* 8.9  MG  --  1.9  PHOS  --  2.7    CBC  Recent Labs Lab 11/11/15 2019 11/11/15 2041 11/11/15 2345 11/12/15 0357  WBC 11.7*  --   --  11.3*  HGB 11.6* 13.6 11.0* 11.7*  HCT 36.2* 40.0 34.5* 35.0*  PLT 195  --   --  177    Coag's  Recent Labs Lab 11/11/15 2019  APTT 32  INR 1.04    Sepsis Markers  Recent Labs Lab 11/12/15 0357 11/12/15 0648  LATICACIDVEN 0.9  --   PROCALCITON  --  <0.10    ABG No results for input(s):  PHART, PCO2ART, PO2ART in the last 168 hours.  Liver Enzymes  Recent Labs Lab 11/11/15 2019  AST 22  ALT 16*  ALKPHOS 79  BILITOT 0.5  ALBUMIN 3.5    Cardiac Enzymes  Recent Labs Lab 11/12/15 1046 11/12/15 1633 11/12/15 1811  TROPONINI 0.25* 0.20* 0.20*    Glucose  Recent Labs Lab 11/12/15 0735  GLUCAP 80    Imaging Dg Chest Port 1 View  11/13/2015  CLINICAL DATA:  Acute respiratory failure EXAM: PORTABLE CHEST 1 VIEW COMPARISON:  11/11/2015 chest radiograph. FINDINGS: Sternotomy wires appear aligned and intact. Stable cardiomediastinal silhouette with normal heart size. No pneumothorax. No pleural effusion. Stable apical right upper lobe lung mass. No pulmonary edema. No acute lung opacity. IMPRESSION: 1. Stable apical right upper lobe lung mass. 2. Otherwise no active disease in the chest. Electronically Signed   By: Ilona Sorrel M.D.   On: 11/13/2015 08:55    STUDIES: Images reviewed. CXR 3/17 > RUL mass CTA 3/17 > 5.3 x 2.9 cm lung mass  with rt hilar, subcarinal and mediastinal LNs  CULTURES:  ANTIBIOTICS:  SIGNIFICANT EVENTS: 3/17 > Admit with hemoptysis  LINES/TUBES:  DISCUSSION: 75 Y/O with significant smoking history admitted with hemoptysis. Found to have a RUL mass with LNs concerning for primary lung cancer.   ASSESSMENT / PLAN:  PULMONARY A: Hemoptysis, appears to have stopped  RUL mass conerning for cancer. P:   Continue to Hold plavix / ASA, will need FOB vs needle (FOB favored) after off plavix x 5 days.  Supplemental O2 Tussionex for cough suppression Nebs PRN  CARDIOVASCULAR A:  CAD s/p CABG DES placement in 7/15 Elevated troponin, peak 0.25 Normal lactic acid P:  Holding ASA and plavix for now  RENAL A:   CKD, baseline cr ~1.4 P:   Monitor urine output and Cr  GASTROINTESTINAL A:   No issues P:   By mouth diet  HEMATOLOGIC A:   Acute blood loss anemia s/p 1 unit PRBC Normal coags and platelets P:  Follow CBC  on 3/20 Transfuse for Hb < 7 or active bleeding.  INFECTIOUS A:   Cough without clear evidence for pneumonia, suspect viral syndrome P:   Observe off antibiotics Check viral panel  ENDOCRINE A:   No issues P:    NEUROLOGIC A:   Stable P:    FAMILY  - Updates: Patient and family updated 3/19 - Inter-disciplinary family meet or Palliative Care meeting due by:  3/25  He's had some dizziness with walking. We will recheck his CBC and also check a viral panel. Keep him here for one more day and then plan for discharge with bronchoscopy to evaluate his lung mass when he's been off Plavix and aspirin for 5 days.  Baltazar Apo, MD, PhD 11/13/2015, 4:56 PM [facility] [phone] or if no answer [phone]

## 2015-11-14 LAB — CBC
HEMATOCRIT: 33.8 % — AB (ref 39.0–52.0)
Hemoglobin: 10.7 g/dL — ABNORMAL LOW (ref 13.0–17.0)
MCH: 27.4 pg (ref 26.0–34.0)
MCHC: 31.7 g/dL (ref 30.0–36.0)
MCV: 86.7 fL (ref 78.0–100.0)
Platelets: 146 10*3/uL — ABNORMAL LOW (ref 150–400)
RBC: 3.9 MIL/uL — ABNORMAL LOW (ref 4.22–5.81)
RDW: 14.4 % (ref 11.5–15.5)
WBC: 7.8 10*3/uL (ref 4.0–10.5)

## 2015-11-14 LAB — GLUCOSE, CAPILLARY: GLUCOSE-CAPILLARY: 86 mg/dL (ref 65–99)

## 2015-11-14 NOTE — Progress Notes (Signed)
Bronchscopy arranged for Thursday 3/23 at 7 30 am OK to dc today  Stay off asa/plavix until then  [facility] V. MD

## 2015-11-14 NOTE — Evaluation (Signed)
Physical Therapy Evaluation/Discharge  Patient Details [name] MRN: [id number] DOB: 12/14/1940 Today's Date: 11/14/2015   History of Present Illness  75 y.o. male who developed hemoptysis while at home. PMH: AAA, hypertension, right bundle branch block, COPD CAD, MI, Lt THA, CABG  Clinical Impression  Patient evaluated by Physical Therapy with no further acute PT needs identified. The patient states that he is almost at his baseline regarding his mobility but he states that he does seem to fatigue quicker than usual. SpO2 97%, pulse 88 following ambulation.  All education has been completed and the patient has no further questions.  See below for any follow-up Physical Therapy or equipment needs. PT is signing off. Thank you for this referral.     Follow Up Recommendations No PT follow up    Equipment Recommendations  None recommended by PT    Recommendations for Other Services       Precautions / Restrictions Restrictions Weight Bearing Restrictions: No      Mobility  Bed Mobility               General bed mobility comments: Patient up in chair upon arrival but reports that he is getting in/out of bed on his own.   Transfers Overall transfer level: Independent Equipment used: None             General transfer comment: no instability, safe technique  Ambulation/Gait Ambulation/Gait assistance: Independent Ambulation Distance (Feet): 50 Feet Assistive device: None Gait Pattern/deviations: WFL(Within Functional Limits)     General Gait Details: Patient states that he feels like he is moving like usual. This was confirmed by his spouse.   Stairs Stairs:  (declined, states that this won't be a problem. )          Wheelchair Mobility    Modified Rankin (Stroke Patients Only)       Balance Overall balance assessment: No apparent balance deficits (not formally assessed) (no LOB with head rotations)                                            Pertinent Vitals/Pain Pain Assessment: No/denies pain    Home Living Family/patient expects to be discharged to:: Private residence Living Arrangements: Spouse/significant other;Children Available Help at Discharge: Family;Available 24 hours/day Type of Home: House Home Access: Stairs to enter Entrance Stairs-Rails: Can reach both Entrance Stairs-Number of Steps: 1 Home Layout: One level Home Equipment: None Additional Comments: Pt reports having one small step into his home.     Prior Function Level of Independence: Independent         Comments: Reports walking a couple miles daily     Hand Dominance        Extremity/Trunk Assessment   Upper Extremity Assessment: Overall WFL for tasks assessed           Lower Extremity Assessment: Overall WFL for tasks assessed         Communication   Communication: No difficulties  Cognition Arousal/Alertness: Awake/alert Behavior During Therapy: WFL for tasks assessed/performed Overall Cognitive Status: Within Functional Limits for tasks assessed                      General Comments General comments (skin integrity, edema, etc.): Patient demonstrating safe and stable mobility at this time. No loss of balance or instability noted.  Exercises        Assessment/Plan    PT Assessment Patent does not need any further PT services  PT Diagnosis     PT Problem List    PT Treatment Interventions     PT Goals (Current goals can be found in the Care Plan section) Acute Rehab PT Goals Patient Stated Goal: go home PT Goal Formulation: With patient Time For Goal Achievement: 11/21/15 Potential to Achieve Goals: Good    Frequency     Barriers to discharge        Co-evaluation               End of Session   Activity Tolerance: Patient tolerated treatment well Patient left: in chair;with call bell/phone within reach;with family/visitor present Nurse Communication: Mobility  status    Functional Assessment Tool Used: clinical judgment Functional Limitation: Mobility: Walking and moving around Mobility: Walking and Moving Around Current Status ([id number]): At least 1 percent but less than 20 percent impaired, limited or restricted Mobility: Walking and Moving Around Goal Status [phone]): At least 1 percent but less than 20 percent impaired, limited or restricted Mobility: Walking and Moving Around Discharge Status [phone]): At least 1 percent but less than 20 percent impaired, limited or restricted    Time: 1017-1032 PT Time Calculation (min) (ACUTE ONLY): 15 min   Charges:   PT Evaluation $PT Eval Moderate Complexity: 1 Procedure     PT G Codes:   PT G-Codes **NOT FOR INPATIENT CLASS** Functional Assessment Tool Used: clinical judgment Functional Limitation: Mobility: Walking and moving around Mobility: Walking and Moving Around Current Status (E9381): At least 1 percent but less than 20 percent impaired, limited or restricted Mobility: Walking and Moving Around Goal Status [phone]): At least 1 percent but less than 20 percent impaired, limited or restricted Mobility: Walking and Moving Around Discharge Status [phone]): At least 1 percent but less than 20 percent impaired, limited or restricted    Cassell Clement, PT, [place] Pager [phone] Office [phone]  11/14/2015, 12:07 PM

## 2015-11-14 NOTE — Care Management Important Message (Signed)
Important Message  Patient Details  [name] MRN: [id number] Date of Birth: 1941-05-18   Medicare Important Message Given:  Yes    Barb Merino [place] 11/14/2015, 3:31 PM

## 2015-11-14 NOTE — Consult Note (Signed)
   [facility] CM Inpatient Consult   11/14/2015  Paul Sandoval [birth year] [id number] Patient screened for [place] [facility] services with his [facility]. Spoke with inpatient RNCM regarding eligibility with [facility].  No needs currently identified.  For changes or needs, please place a consult for services.  For questions, please contact: Natividad Brood, RN BSN [facility] Liaison  [phone] business mobile phone Toll free office [phone]

## 2015-11-14 NOTE — Progress Notes (Signed)
Pt has been discharged home with wife. Pt's IV was removed with no complications. Pt and pt's wife received discharge instructions and all questions were answered. Pt left the flood via wheelchair and was accompanied by a nurse tech. Pt was in no distress at time of discharge.   Grant Fontana RN, BSN

## 2015-11-14 NOTE — Discharge Summary (Signed)
Physician Discharge Summary       Patient ID: Paul Sandoval MRN: [id number] DOB/AGE: 03/17/1941 74 y.o.  Admit date: 25-Nov-2015 Discharge date: 11/14/2015  Discharge Diagnoses:  Principal Problem:   Massive hemoptysis Active Problems:   Essential hypertension   AAA (abdominal aortic aneurysm) ([facility])   CAD (coronary artery disease)   Cardiomyopathy, ischemic:  EF 45-50% echo 03/17/14   Mass of lung   CKD (chronic kidney disease) stage 3, GFR 30-59 ml/min   Hyponatremia   Leukocytosis   Cough with hemoptysis   History of Present Illness: Mr Winker is a 75 Y/O with PMH of HTN, HLD, CAD s/p CABG, COPD. He was in his usual state of health yesterday when he developed hemoptysis starting at 5 pm. He brought up approximately a cup full of blood. He denies any preceding symptoms of cough, dyspnea, wheezing, sputum production, fevers or chills. He went to [facility] ED where he continued to have hemoptysis of 200-300cc with a Hb drop from 13.6 to 11. He remained hemodynamically stable and able to protect airway. He was admitted to ICU for continued hemoptysis. ASA and Plavix were held.    Hospital Course:  Shortly after admission hemoptysis spontaneously abated. CXR and CT confirmed RUL mass concerning for cancer. He had one episode of fever, however, hemoptysis resolved. Cough was productive for yellow/green sputum. No focal opacification, so it was felt cough was due to bronchitis. Patient was set up for bronchoscopy 3/23.  He is deemed a candidate for discharge 3/20.   Discharge Plan by active problems   Hemoptysis, appears to have stopped  RUL mass conerning for cancer. -Hold plavix / ASA, will need FOB after off plavix 5 days. -Cough suppression  -FOB set up for 3/23  CAD s/p CABG DES placement in 7/15 Elevated troponin, peak 0.25 - Holding ASA and plavix until FOB  Acute blood loss anemia s/p 1 unit PRBC Normal coags and platelets -Follow CBC on 3/20  Cough without  clear evidence for pneumonia, suspect viral syndrome -Observe off antibiotics -f/u cultures, viral panel  Significant Hospital tests/ studies  CXR 2022-11-25 > RUL mass CTA November 25, 2022 > 5.3 x 2.9 cm lung mass with rt hilar, subcarinal and mediastinal LNs  Consults  none  Discharge Exam: BP 117/52 mmHg  Pulse 101  Temp(Src) 99.3 F (37.4 C) (Oral)  Resp 18  Ht '5\' 10"'$  (1.778 m)  Wt 84.959 kg (187 lb 4.8 oz)  BMI 26.87 kg/m2  SpO2 98%  General:  Male of normal body habitus resting in chair Neuro:  Alert, oriented, non-focal HEENT:  [place]/AT, No JVD noted, PERRL Cardiovascular:  RRR, no MRG Lungs:  Clear bilateral breath sounds Abdomen:  Soft, non-distended, non-tender Musculoskeletal:  No acute deformity Skin:  Grossly intact    Labs at discharge Lab Results  Component Value Date   CREATININE 1.34* 11/12/2015   BUN 21* 11/12/2015   NA 137 11/12/2015   K 4.3 11/12/2015   CL 100* 11/12/2015   CO2 27 11/12/2015   Lab Results  Component Value Date   WBC 7.8 11/14/2015   HGB 10.7* 11/14/2015   HCT 33.8* 11/14/2015   MCV 86.7 11/14/2015   PLT 146* 11/14/2015   Lab Results  Component Value Date   ALT 16* 25-Nov-2015   AST 22 11-25-15   ALKPHOS 79 11-25-2015   BILITOT 0.5 11/25/15   Lab Results  Component Value Date   INR 1.04 11/25/2015   INR 1.02 08/25/2014   INR 1.03 03/24/2014  Current radiology studies Dg Chest Port 1 View  11/13/2015  CLINICAL DATA:  Acute respiratory failure EXAM: PORTABLE CHEST 1 VIEW COMPARISON:  11/11/2015 chest radiograph. FINDINGS: Sternotomy wires appear aligned and intact. Stable cardiomediastinal silhouette with normal heart size. No pneumothorax. No pleural effusion. Stable apical right upper lobe lung mass. No pulmonary edema. No acute lung opacity. IMPRESSION: 1. Stable apical right upper lobe lung mass. 2. Otherwise no active disease in the chest. Electronically Signed   By: Ilona Sorrel M.D.   On: 11/13/2015 08:55    Disposition:   01-Home or Self Care      Discharge Instructions    Diet - low sodium heart healthy    Complete by:  As directed      Discharge instructions    Complete by:  As directed   Hold Aspirin and Plavix until bronchoscopy 3/23.  Please do not eat or drink after midnight 3/23 Please arrive at [facility] area 3/23 7am for Bronchoscopy  Please call MD for continued coughing up blood Over the counter Robitussin DM for cough.     Increase activity slowly    Complete by:  As directed             Medication List    STOP taking these medications        aspirin EC 81 MG tablet     clopidogrel 75 MG tablet  Commonly known as:  PLAVIX      TAKE these medications        acetaminophen 500 MG tablet  Commonly known as:  TYLENOL  Take 500 mg by mouth every 6 (six) hours as needed for mild pain.     CENTRUM SILVER ADULT 50+ Tabs  Take 1 tablet by mouth daily.     enalapril 10 MG tablet  Commonly known as:  VASOTEC  Take one and a half tablets PO BID     hydrochlorothiazide 25 MG tablet  Commonly known as:  HYDRODIURIL  Take 0.5 tablets (12.5 mg total) by mouth daily.     Iron 28 MG Tabs  Take 28 mg by mouth daily.     metoprolol 50 MG tablet  Commonly known as:  LOPRESSOR  Take 50 mg by mouth 2 (two) times daily.     nitroGLYCERIN 0.4 MG SL tablet  Commonly known as:  NITROSTAT  Place 1 tablet (0.4 mg total) under the tongue every 5 (five) minutes as needed for chest pain.     omeprazole 20 MG capsule  Commonly known as:  PRILOSEC  Take 1 capsule (20 mg total) by mouth daily.     simvastatin 80 MG tablet  Commonly known as:  ZOCOR  Take 0.5 tablets (40 mg total) by mouth at bedtime.     VENTOLIN HFA 108 (90 Base) MCG/ACT inhaler  Generic drug:  albuterol  INHALE 2 PUFFS BY MOUTH EVERY 4 TO 6 HOURS AS NEEDED FOR WHEEZING.       Follow-up Information    Follow up with Rexene Edison, NP On 11/24/2015.   Specialty:  Pulmonary Disease   Why:  3:00 PM -  [facility]   Contact information:   [address] [address] [phone]       Discharged Condition: good  Greater than 30 minutes of time have been dedicated to discharge assessment, planning and discharge instructions.   Signed: Georgann Housekeeper, AGACNP-BC [facility] Pager [phone] or [phone]  11/14/2015 1:01 PM

## 2015-11-14 NOTE — Progress Notes (Signed)
Utilization review completed. Laxmi Choung, RN, BSN. 

## 2015-11-16 ENCOUNTER — Telehealth: Payer: Self-pay | Admitting: Pulmonary Disease

## 2015-11-16 DIAGNOSIS — R911 Solitary pulmonary nodule: Secondary | ICD-10-CM

## 2015-11-16 LAB — RESPIRATORY VIRUS PANEL
Adenovirus: NEGATIVE
INFLUENZA B 1: POSITIVE — AB
Influenza A: NEGATIVE
Metapneumovirus: NEGATIVE
PARAINFLUENZA 1 A: NEGATIVE
PARAINFLUENZA 2 A: NEGATIVE
Parainfluenza 3: NEGATIVE
RESPIRATORY SYNCYTIAL VIRUS B: NEGATIVE
Respiratory Syncytial Virus A: NEGATIVE
Rhinovirus: NEGATIVE

## 2015-11-16 NOTE — Telephone Encounter (Signed)
PET scan ordered.  Nothing further needed. 

## 2015-11-16 NOTE — Telephone Encounter (Signed)
Discussed with patient-okay to proceed Please schedule PET scan

## 2015-11-16 NOTE — Telephone Encounter (Signed)
Pt scheduled for Bronch 11/17/15 States that he has developed a cold - c/o dry hacky cough, runny nose and some congestion - no mucus production.  Pt states that he has noticed increased difficulty laying flat d/t SOB. Denies fever.  Please advise Dr Elsworth Soho if you feel the patient is okay to have Bronch 3/23. Thanks.

## 2015-11-17 ENCOUNTER — Ambulatory Visit (HOSPITAL_COMMUNITY): Payer: Medicare Other

## 2015-11-17 ENCOUNTER — Ambulatory Visit (HOSPITAL_COMMUNITY)
Admit: 2015-11-17 | Discharge: 2015-11-17 | Disposition: A | Discharge status: Home / Self Care | Payer: Medicare Other | Attending: Pulmonary Disease | Admitting: Pulmonary Disease

## 2015-11-17 ENCOUNTER — Ambulatory Visit (HOSPITAL_COMMUNITY)
Admission: RE | Admit: 2015-11-17 | Discharge: 2015-11-17 | Disposition: A | Discharge status: Home / Self Care | Payer: Medicare Other | Source: Ambulatory Visit | Attending: Pulmonary Disease | Admitting: Pulmonary Disease

## 2015-11-17 ENCOUNTER — Encounter (HOSPITAL_COMMUNITY)
Admission: RE | Disposition: A | Discharge status: Home / Self Care | Payer: Self-pay | Source: Ambulatory Visit | Attending: Pulmonary Disease

## 2015-11-17 ENCOUNTER — Encounter (HOSPITAL_COMMUNITY): Payer: Self-pay | Admitting: Pulmonary Disease

## 2015-11-17 ENCOUNTER — Telehealth: Payer: Self-pay | Admitting: Pulmonary Disease

## 2015-11-17 DIAGNOSIS — N183 Chronic kidney disease, stage 3 (moderate): Secondary | ICD-10-CM | POA: Diagnosis not present

## 2015-11-17 DIAGNOSIS — D1431 Benign neoplasm of right bronchus and lung: Secondary | ICD-10-CM | POA: Diagnosis not present

## 2015-11-17 DIAGNOSIS — I129 Hypertensive chronic kidney disease with stage 1 through stage 4 chronic kidney disease, or unspecified chronic kidney disease: Secondary | ICD-10-CM | POA: Insufficient documentation

## 2015-11-17 DIAGNOSIS — C3490 Malignant neoplasm of unspecified part of unspecified bronchus or lung: Secondary | ICD-10-CM | POA: Diagnosis not present

## 2015-11-17 DIAGNOSIS — R918 Other nonspecific abnormal finding of lung field: Secondary | ICD-10-CM | POA: Diagnosis not present

## 2015-11-17 DIAGNOSIS — Z9889 Other specified postprocedural states: Secondary | ICD-10-CM

## 2015-11-17 HISTORY — PX: VIDEO BRONCHOSCOPY: SHX5072

## 2015-11-17 SURGERY — BRONCHOSCOPY, WITH FLUOROSCOPY
Anesthesia: Moderate Sedation

## 2015-11-17 MED ORDER — MIDAZOLAM HCL 5 MG/ML IJ SOLN
INTRAMUSCULAR | Status: AC
  Filled 2015-11-17: qty 2

## 2015-11-17 MED ORDER — FENTANYL CITRATE (PF) 100 MCG/2ML IJ SOLN
25.0000 ug | Freq: Once | INTRAMUSCULAR | Status: DC | PRN
  Administered 2015-11-17 (×4): 25 ug via INTRAVENOUS

## 2015-11-17 MED ORDER — IPRATROPIUM-ALBUTEROL 0.5-2.5 (3) MG/3ML IN SOLN
RESPIRATORY_TRACT | Status: AC
  Filled 2015-11-17: qty 3

## 2015-11-17 MED ORDER — MIDAZOLAM HCL 10 MG/2ML IJ SOLN
INTRAMUSCULAR | Status: DC | PRN
  Administered 2015-11-17 (×3): 1 mg via INTRAVENOUS

## 2015-11-17 MED ORDER — PHENYLEPHRINE HCL 0.25 % NA SOLN
NASAL | Status: DC | PRN
  Administered 2015-11-17: 2 via NASAL

## 2015-11-17 MED ORDER — LIDOCAINE HCL 2 % EX GEL
CUTANEOUS | Status: DC | PRN
  Administered 2015-11-17: 1

## 2015-11-17 MED ORDER — PREDNISONE 10 MG PO TABS: ORAL_TABLET | ORAL | Status: DC

## 2015-11-17 MED ORDER — FENTANYL CITRATE (PF) 100 MCG/2ML IJ SOLN
INTRAMUSCULAR | Status: AC
  Filled 2015-11-17: qty 4

## 2015-11-17 MED ORDER — IPRATROPIUM-ALBUTEROL 0.5-2.5 (3) MG/3ML IN SOLN
3.0000 mL | Freq: Once | RESPIRATORY_TRACT | Status: AC
  Administered 2015-11-17: 3 mL via RESPIRATORY_TRACT

## 2015-11-17 MED ORDER — LIDOCAINE HCL (PF) 1 % IJ SOLN
INTRAMUSCULAR | Status: DC | PRN
  Administered 2015-11-17: 6 mL

## 2015-11-17 NOTE — Interval H&P Note (Signed)
History and Physical Interval Note:  11/17/2015 8:38 AM  Woodroe Mode  has presented today for surgery, with the diagnosis of lung mass   The various methods of treatment have been discussed with the patient and family. After consideration of risks, benefits and other options for treatment, the patient has consented to  Procedure(s): VIDEO BRONCHOSCOPY WITH FLUORO (N/A) as a surgical intervention .  The patient's history has been reviewed, patient examined, no change in status, stable for surgery.  I have reviewed the patient's chart and labs.  Questions were answered to the patient's satisfaction.     ALVA,RAKESH V.

## 2015-11-17 NOTE — Progress Notes (Signed)
Video bronchoscopy with washing intervention, brushing intervention, biospy intervention. All vitals good thru out. Report given to endo RN

## 2015-11-17 NOTE — Telephone Encounter (Signed)
Pl call in Prednisone 10 mg tabs  Take 2 tabs daily with food x 5ds, then 1 tab daily with food x 5ds then STOP

## 2015-11-17 NOTE — Telephone Encounter (Signed)
Spoke with pt's son, Legrand Como. Rx has been sent in per RA. Nothing further was needed.

## 2015-11-17 NOTE — H&P (View-Only) (Signed)
Bronchscopy arranged for Thursday 3/23 at 7 30 am OK to dc today  Stay off asa/plavix until then  [facility] V. MD

## 2015-11-17 NOTE — Op Note (Signed)
[facility] Patient [name] Date: 11/17/2015 MRN: [id number] Attending MD: Kara Mead , MD Date of Birth: Apr 18, 1941 CSN: Finalized Age: 75 Admit Type: Outpatient Gender: Male Procedure:            Bronchoscopy Indications:          Right upper lobe lung mass suspicious for cancer Providers:            Kara Mead, MD, Ashley Mariner RRT,RCP, Tammie Readling                        RRT,RCP Referring MD:          Medicines:            Lidocaine applied to nares and subglottic space,                        Midazolam 3 mg mg IV, Fentanyl 149 mcg IV Complications:        No immediate complications. Estimated blood loss:                        Minimal Estimated Blood Loss: Estimated blood loss was minimal. Procedure:            Pre-Anesthesia Assessment:                       - Prior to the procedure, a History and Physical was                        performed, and patient medications and allergies were                        reviewed. The patient's tolerance of previous                        anesthesia was also reviewed. The risks and benefits of                        the procedure and the sedation options and risks were                        discussed with the patient. All questions were                        answered, and informed consent was obtained. Prior                        Anticoagulants: The patient has taken aspirin, last                        dose was 4 days prior to procedure. ASA Grade                        Assessment: II - A patient with mild systemic disease.                        After reviewing the risks and benefits, the patient was                        deemed in satisfactory condition to undergo  the                        procedure.                       After obtaining informed consent, the bronchoscope was                        passed under direct vision. Throughout the procedure,    the patient's blood pressure, pulse, and oxygen                        saturations were monitored continuously. the [id number]                        [id number] scope was introduced through the right nostril                        and advanced to the tracheobronchial tree. The patient                        tolerated the procedure fairly well. Scope In: 8:21:32 AM Scope Out: 8:36:20 AM Findings:      Respiratory tract:no endobronchial lesions. Mucosa appeared friable &       would bleed on suction trauma      Brushings of a lesion were obtained in the apical segment of the right       upper lobe with a cytology brush. Two samples were obtained.      Washings were obtained in the right upper lobe. The return was bloody.      Transbronchial biopsies of a lesion were performed in the apical segment       of the right upper lobe using forceps and sent for histopathology       examination. The procedure was guided by fluoroscopy. Transbronchial       biopsy technique was selected because the sampling site was not visible       endoscopically. Four biopsy passes were performed. Impression:           - Right upper lobe lung mass suspicious for cancer                       - Brushings were obtained.                       - Washings were obtained.                       - Transbronchial lung biopsies were performed. Moderate Sedation:      Moderate (conscious) sedation was personally administered by the       endoscopist. The following parameters were monitored: oxygen saturation,       heart rate, blood pressure, and response to care. Total physician       intraservice time was 20 minutes. Recommendation:       - Await test results.                       - PET scan in 1 week.                       - Follow up with bronchoscopist in one week. Procedure Code(s):    ---  Professional ---                       [phone], Bronchoscopy, rigid or flexible, including                        fluoroscopic  guidance, when performed; with                        transbronchial lung biopsy(s), single lobe                       [id number], Bronchoscopy, rigid or flexible, including                        fluoroscopic guidance, when performed; with brushing or                        protected brushings                       [id number], Moderate sedation services provided by the same                        physician or other qualified health care professional                        performing the diagnostic or therapeutic service that                        the sedation supports, requiring the presence of an                        independent trained observer to assist in the                        monitoring of the patient's level of consciousness and                        physiological status; initial 15 minutes of                        intraservice time, patient age 19 years or older Diagnosis Code(s):    --- Professional ---                       R91.8, Other nonspecific abnormal finding of lung field CPT copyright 2016 [employer]. All rights reserved. The codes documented in this report are preliminary and upon coder review may  be revised to meet current compliance requirements. Kara Mead, MD Kara Mead, MD 11/17/2015 8:48:22 AM This report has been signed electronically. Number of Addenda: 0

## 2015-11-18 LAB — ACID FAST SMEAR (AFB, MYCOBACTERIA)

## 2015-11-18 LAB — CULTURE, BLOOD (ROUTINE X 2)
CULTURE: NO GROWTH
Culture: NO GROWTH

## 2015-11-18 LAB — ACID FAST SMEAR (AFB): ACID FAST SMEAR - AFSCU2: NEGATIVE

## 2015-11-20 LAB — CULTURE, BAL-QUANTITATIVE W GRAM STAIN
Colony Count: >=100000
Culture: NORMAL

## 2015-11-20 LAB — CULTURE, BAL-QUANTITATIVE

## 2015-11-22 ENCOUNTER — Telehealth: Payer: Self-pay | Admitting: *Deleted

## 2015-11-22 ENCOUNTER — Telehealth: Payer: Self-pay | Admitting: Pulmonary Disease

## 2015-11-22 DIAGNOSIS — C3490 Malignant neoplasm of unspecified part of unspecified bronchus or lung: Secondary | ICD-10-CM

## 2015-11-22 NOTE — Telephone Encounter (Signed)
Called and spoke to pt. Pt states he is still having a dry hacking cough - no worse. Pt denies SOB, CP/tightness, wheezing, f/c/s. Pt has an appt with TP on 11/24/15 for HFU. Informed pt that if any s/s worsens then he needs to call back or seek emergency care. Pt verbalized understanding and denied any further questions or concerns at this time.

## 2015-11-22 NOTE — Telephone Encounter (Signed)
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 Will not put in system until patient has been notified so [facility] do not contact patient prematurely.  Patient will be notified by TP at [place] on 3/30 per RA  To TP for follow up

## 2015-11-22 NOTE — Telephone Encounter (Signed)
-----   Message from Rigoberto Noel, MD sent at 11/22/2015 11:28 AM EDT ----- Squamous cell cancer Pl refer to [place], PET scheduled for 3/31 He sees TP on 3/30 - wait until then to let him know of diagnosis

## 2015-11-24 ENCOUNTER — Ambulatory Visit (INDEPENDENT_AMBULATORY_CARE_PROVIDER_SITE_OTHER): Payer: Medicare Other | Admitting: Adult Health

## 2015-11-24 ENCOUNTER — Encounter: Payer: Self-pay | Admitting: Family Medicine

## 2015-11-24 ENCOUNTER — Encounter: Payer: Self-pay | Admitting: Adult Health

## 2015-11-24 ENCOUNTER — Ambulatory Visit (INDEPENDENT_AMBULATORY_CARE_PROVIDER_SITE_OTHER): Payer: Medicare Other | Admitting: Family Medicine

## 2015-11-24 ENCOUNTER — Ambulatory Visit (INDEPENDENT_AMBULATORY_CARE_PROVIDER_SITE_OTHER)
Admission: RE | Admit: 2015-11-24 | Discharge: 2015-11-24 | Disposition: A | Discharge status: Home / Self Care | Payer: Medicare Other | Source: Ambulatory Visit | Attending: Adult Health | Admitting: Adult Health

## 2015-11-24 VITALS — BP 144/76 | HR 91 | Temp 97.9°F | Ht 70.0 in | Wt 185.0 lb

## 2015-11-24 VITALS — BP 132/68 | Ht 70.0 in | Wt 184.4 lb

## 2015-11-24 DIAGNOSIS — R05 Cough: Secondary | ICD-10-CM | POA: Diagnosis not present

## 2015-11-24 DIAGNOSIS — R042 Hemoptysis: Secondary | ICD-10-CM

## 2015-11-24 DIAGNOSIS — I10 Essential (primary) hypertension: Secondary | ICD-10-CM

## 2015-11-24 DIAGNOSIS — C3490 Malignant neoplasm of unspecified part of unspecified bronchus or lung: Secondary | ICD-10-CM | POA: Diagnosis not present

## 2015-11-24 DIAGNOSIS — C3411 Malignant neoplasm of upper lobe, right bronchus or lung: Secondary | ICD-10-CM | POA: Insufficient documentation

## 2015-11-24 NOTE — Patient Instructions (Addendum)
Follow up for PET scan tomorrow as planned  We will refer you to Oncology as discussed.  Set up for PFT  Follow up with Dr. Elsworth Soho  In 6-8 weeks and As needed   Please contact office for sooner follow up if symptoms do not improve or worsen or seek emergency care

## 2015-11-24 NOTE — Assessment & Plan Note (Signed)
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  Set up for PFT  Follow up with Dr. Elsworth Soho  In 6-8 weeks and As needed   Please contact office for sooner follow up if symptoms do not improve or worsen or seek emergency care

## 2015-11-24 NOTE — Progress Notes (Signed)
Subjective:    Patient ID: Paul Sandoval, male    DOB: 05/01/1941, 75 y.o.   MRN: [id number]  HPI 75 yo male former smoker , quit 1996 (2 PPD x 65yr   11/24/2015 Post hospital follow up : Lung mass /Lung Cancer  Pt presents for a post hospital follow up  Seen by pulmonary during hospital stay for large RUL mass and hemoptysis.  CT chest showed a 5.3 x 2.9cm lung mass with Right hilar , subcarinal and mediastinal LN.  He had a Bronch with cytology positive for Non small carcinoma , squamous cell.  I discussed CT chest results and path report for cancer.  Support provided.  He has been set up for a PET scan tomorrow.  Case reviewed by Dr. AElsworth Soho [facility]  Discussed the next steps with pt and family.  Says he walked 2 miles 5 days a week for last 20 years.  He says he is feeling better, no further hemoptysis .    Past Medical History  Diagnosis Date  . 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 AAA (abdominal aortic aneurysm) ([facility] 2010    4.4 cm 08/2008;4.44 in 7/10 and 4.65 in 08/2009; 4.8 by CT in 11/2009; 4.3 by ultrasound in 08/2010  . Hypertension   . Hyperlipidemia   . Tobacco abuse, in remission     40 pack year total consumption; discontinued in 1996  . GERD (gastroesophageal reflux disease)   . Right bundle branch block   . Diverticulosis   . Colonic polyp 2002    polypectomy in 2002  . COPD ([place] obstructive pulmonary disease) [place]   . CAD (coronary artery disease)     03/18/14:  PCI with DES to distal left main. 7/29: DES to the SVG to Diag  . ED (erectile dysfunction)   . IFG (impaired fasting glucose)   . Chronic rhinitis   . Myocardial infarction ([facility]     "told h/o silent MI sometime before 1996"  . Pneumonia ~ 2001; ~ 2005  . [place] bronchitis [place]   . Arthritis     "fingers" (03/18/2014)  . Cardiomyopathy, ischemic     Echo 03/17/14: EF 45-50%   Current Outpatient Prescriptions on File Prior to  Visit  Medication Sig Dispense Refill  . acetaminophen (TYLENOL) 500 MG tablet Take 500 mg by mouth every 6 (six) hours as needed for mild pain.    .Marland Kitchenenalapril (VASOTEC) 10 MG tablet Take one and a half tablets PO BID 90 tablet 5  . Ferrous Sulfate (IRON) 28 MG TABS Take 28 mg by mouth daily.    . hydrochlorothiazide (HYDRODIURIL) 25 MG tablet Take 0.5 tablets (12.5 mg total) by mouth daily.    . metoprolol (LOPRESSOR) 50 MG tablet Take 50 mg by mouth 2 (two) times daily.     . Multiple Vitamins-Minerals (CENTRUM SILVER ADULT 50+) TABS Take 1 tablet by mouth daily.     . nitroGLYCERIN (NITROSTAT) 0.4 MG SL tablet Place 1 tablet (0.4 mg total) under the tongue every 5 (five) minutes as needed for chest pain. 25 tablet 4  . omeprazole (PRILOSEC) 20 MG capsule Take 1 capsule (20 mg total) by mouth daily. 90 capsule 3  . predniSONE (DELTASONE) 10 MG tablet Take 2 tabs daily with food x 5ds, then 1 tab daily with food x 5ds then STOP 15 tablet 0  . simvastatin (ZOCOR) 80 MG tablet Take 0.5 tablets (  40 mg total) by mouth at bedtime. 15 tablet 5  . VENTOLIN HFA 108 (90 Base) MCG/ACT inhaler INHALE 2 PUFFS BY MOUTH EVERY 4 TO 6 HOURS AS NEEDED FOR WHEEZING. 18 g 5   No current facility-administered medications on file prior to visit.     Review of Systems Constitutional:   No  weight loss, night sweats,  Fevers, chills, fatigue, or  lassitude.  HEENT:   No headaches,  Difficulty swallowing,  Tooth/dental problems, or  Sore throat,                No sneezing, itching, ear ache, nasal congestion, post nasal drip,   CV:  No chest pain,  Orthopnea, PND, swelling in lower extremities, anasarca, dizziness, palpitations, syncope.   GI  No heartburn, indigestion, abdominal pain, nausea, vomiting, diarrhea, change in bowel habits, loss of appetite, bloody stools.   Resp:    No chest wall deformity  Skin: no rash or lesions.  GU: no dysuria, change in color of urine, no urgency or frequency.  No flank  pain, no hematuria   MS:  No joint pain or swelling.  No decreased range of motion.  No back pain.  Psych:  No change in mood or affect. No depression or anxiety.  No memory loss.         Objective:   Physical Exam   . Filed Vitals:   11/24/15 1511  BP: 144/76  Pulse: 91  Temp: 97.9 F (36.6 C)  TempSrc: Oral  Height: '5\' 10"'$  (1.778 m)  Weight: 185 lb (83.915 kg)  SpO2: 96%   GEN: A/Ox3; pleasant , NAD, elderly   HEENT:  [place]/AT,  EACs-clear, TMs-wnl, NOSE-clear, THROAT-clear, no lesions, no postnasal drip or exudate noted.   NECK:  Supple w/ fair ROM; no JVD; normal carotid impulses w/o bruits; no thyromegaly or nodules palpated; no lymphadenopathy.  RESP  Clear  P & A; w/o, wheezes/ rales/ or rhonchi.no accessory muscle use, no dullness to percussion  CARD:  RRR, no m/r/g  , no peripheral edema, pulses intact, no cyanosis or clubbing.  GI:   Soft & nt; nml bowel sounds; no organomegaly or masses detected.  Musco: Warm bil, no deformities or joint swelling noted.   Neuro: alert, no focal deficits noted.    Skin: Warm, no lesions or rashes   CT chest 11/11/15  Anterior right upper lobe 5.3 x 2.9 cm lung mass, most suggestive of a primary bronchogenic carcinoma. This mass abuts the anterior visceral pleura along a broad base. No pleural effusion. 3. Right hilar and subcarinal, right paratracheal and left upper prevascular mediastinal lymphadenopathy, suspicious for nodal metastases. Jabez Molner NP-C  [facility]  11/24/2015       Assessment & Plan:

## 2015-11-24 NOTE — Progress Notes (Signed)
   Subjective:    Patient ID: Paul Sandoval, male    DOB: 1940/10/15, 75 y.o.   MRN: [id number] Patient arrives office for follow-up from hospitalization. He was contacted within 2 business days of discharge from the hospital and this appointment was set up. HPI Patient in today for a hospitalization at [facility]. follow up for a lung mass.(see ER note from 11/11/15)   Patient reports challenging hospitalization. Had a significant spell of hemoptysis. Subsequent x-ray and scan refilled a lung mass. Patient had bronchoscopy. Due to see pulmonologist later today for results. Next  Long-standing history of smoking.  Slight shortness of breath with exertion but no substantial cough.  Compliant with other medications. Next  Anticoagulant meds were stopped due to hemoptysis Review of Systems No headache, no major weight loss or weight gain, no chest pain no back pain abdominal pain no change in bowel habits complete ROS otherwise negative     Objective:   Physical Exam   Alert somewhat anxious appearing no acute distress. Blood pressure good on repeat lungs diminished breath sounds diffusely no wheezes no crackles heart regular in rhythm. Impression 1     Assessment & Plan:  Impression 1 probable lung carcinoma discussed at great length #2 anticoagulation stopped patient encouraged to maintain same #3 stress secondary #1 discuss plan medications reviewed and clarified. Diet exercise discussed. Follow-up with specialist. Follow-up here as scheduled

## 2015-11-25 ENCOUNTER — Telehealth: Payer: Self-pay | Admitting: *Deleted

## 2015-11-25 ENCOUNTER — Ambulatory Visit (HOSPITAL_COMMUNITY)
Admission: RE | Admit: 2015-11-25 | Discharge: 2015-11-25 | Disposition: A | Discharge status: Home / Self Care | Payer: Medicare Other | Source: Ambulatory Visit | Attending: Pulmonary Disease | Admitting: Pulmonary Disease

## 2015-11-25 ENCOUNTER — Other Ambulatory Visit: Payer: Self-pay

## 2015-11-25 DIAGNOSIS — R911 Solitary pulmonary nodule: Secondary | ICD-10-CM | POA: Diagnosis not present

## 2015-11-25 DIAGNOSIS — C3490 Malignant neoplasm of unspecified part of unspecified bronchus or lung: Secondary | ICD-10-CM

## 2015-11-25 DIAGNOSIS — C771 Secondary and unspecified malignant neoplasm of intrathoracic lymph nodes: Secondary | ICD-10-CM | POA: Insufficient documentation

## 2015-11-25 LAB — GLUCOSE, CAPILLARY: GLUCOSE-CAPILLARY: 94 mg/dL (ref 65–99)

## 2015-11-25 MED ORDER — FLUDEOXYGLUCOSE F - 18 (FDG) INJECTION
9.1600 | Freq: Once | INTRAVENOUS | Status: AC | PRN
  Administered 2015-11-25: 9.16 via INTRAVENOUS

## 2015-11-25 NOTE — Telephone Encounter (Signed)
Oncology Nurse Navigator Documentation  Oncology Nurse Navigator Flowsheets 11/25/2015  Navigator Encounter Type Introductory phone call  Treatment Phase Pre-Tx/Tx Discussion  Barriers/Navigation Needs Coordination of Care  Interventions Coordination of Care  Coordination of Care Appts  Acuity Level 1  Time Spent with Patient 15   I received referral on Paul Sandoval.  I called to schedule.  I left my name and phone number to call.

## 2015-11-28 NOTE — Progress Notes (Signed)
Reviewed & agree with plan  

## 2015-11-30 ENCOUNTER — Other Ambulatory Visit: Payer: Self-pay | Admitting: Medical Oncology

## 2015-11-30 ENCOUNTER — Telehealth: Payer: Self-pay | Admitting: *Deleted

## 2015-11-30 DIAGNOSIS — C3490 Malignant neoplasm of unspecified part of unspecified bronchus or lung: Secondary | ICD-10-CM

## 2015-11-30 NOTE — Telephone Encounter (Signed)
Oncology Nurse Navigator Documentation  Oncology Nurse Navigator Flowsheets 11/30/2015  Navigator Encounter Type Telephone  Telephone Incoming Call  Treatment Phase Pre-Tx/Tx Discussion  Barriers/Navigation Needs Education  Acuity Level 1  Acuity Level 1 Minimal follow up required  Time Spent with Patient 15   Patient called.  He had questions about visit tomorrow.  I stated patient has no pre visit instructions.  He was thankful for the call back.

## 2015-12-01 ENCOUNTER — Telehealth: Payer: Self-pay | Admitting: Internal Medicine

## 2015-12-01 ENCOUNTER — Encounter: Payer: Self-pay | Admitting: *Deleted

## 2015-12-01 ENCOUNTER — Other Ambulatory Visit (HOSPITAL_BASED_OUTPATIENT_CLINIC_OR_DEPARTMENT_OTHER): Payer: Medicare Other

## 2015-12-01 ENCOUNTER — Ambulatory Visit (HOSPITAL_BASED_OUTPATIENT_CLINIC_OR_DEPARTMENT_OTHER): Payer: Medicare Other | Admitting: Internal Medicine

## 2015-12-01 ENCOUNTER — Ambulatory Visit
Admission: RE | Admit: 2015-12-01 | Discharge: 2015-12-01 | Disposition: A | Discharge status: Home / Self Care | Payer: Medicare Other | Source: Ambulatory Visit | Attending: Radiation Oncology | Admitting: Radiation Oncology

## 2015-12-01 ENCOUNTER — Ambulatory Visit: Payer: Medicare Other | Admitting: Physical Therapy

## 2015-12-01 ENCOUNTER — Encounter: Payer: Self-pay | Admitting: Internal Medicine

## 2015-12-01 VITALS — BP 142/54 | HR 88 | Temp 97.4°F | Resp 18 | Ht 70.0 in | Wt 185.2 lb

## 2015-12-01 DIAGNOSIS — C3411 Malignant neoplasm of upper lobe, right bronchus or lung: Secondary | ICD-10-CM | POA: Diagnosis not present

## 2015-12-01 DIAGNOSIS — E875 Hyperkalemia: Secondary | ICD-10-CM | POA: Diagnosis not present

## 2015-12-01 DIAGNOSIS — C3490 Malignant neoplasm of unspecified part of unspecified bronchus or lung: Secondary | ICD-10-CM

## 2015-12-01 LAB — COMPREHENSIVE METABOLIC PANEL
ALBUMIN: 2.9 g/dL — AB (ref 3.5–5.0)
ALK PHOS: 83 U/L (ref 40–150)
ALT: 19 U/L (ref 0–55)
AST: 20 U/L (ref 5–34)
Anion Gap: 7 mEq/L (ref 3–11)
BUN: 26.3 mg/dL — AB (ref 7.0–26.0)
CHLORIDE: 100 meq/L (ref 98–109)
CO2: 28 meq/L (ref 22–29)
Calcium: 9.4 mg/dL (ref 8.4–10.4)
Creatinine: 1.6 mg/dL — ABNORMAL HIGH (ref 0.7–1.3)
EGFR: 40 mL/min/{1.73_m2} — ABNORMAL LOW (ref >90)
GLUCOSE: 112 mg/dL (ref 70–140)
POTASSIUM: 4.4 meq/L (ref 3.5–5.1)
SODIUM: 135 meq/L — AB (ref 136–145)
Total Bilirubin: 0.52 mg/dL (ref 0.20–1.20)
Total Protein: 7.5 g/dL (ref 6.4–8.3)

## 2015-12-01 LAB — CBC WITH DIFFERENTIAL/PLATELET
BASO%: 0.6 % (ref 0.0–2.0)
Basophils Absolute: 0.1 10*3/uL (ref 0.0–0.1)
EOS%: 0.3 % (ref 0.0–7.0)
Eosinophils Absolute: 0 10*3/uL (ref 0.0–0.5)
HCT: 34.8 % — ABNORMAL LOW (ref 38.4–49.9)
HGB: 11.1 g/dL — ABNORMAL LOW (ref 13.0–17.1)
LYMPH%: 12.4 % — AB (ref 14.0–49.0)
MCH: 27.6 pg (ref 27.2–33.4)
MCHC: 32 g/dL (ref 32.0–36.0)
MCV: 86.2 fL (ref 79.3–98.0)
MONO#: 1.1 10*3/uL — ABNORMAL HIGH (ref 0.1–0.9)
MONO%: 9.2 % (ref 0.0–14.0)
NEUT#: 9.7 10*3/uL — ABNORMAL HIGH (ref 1.5–6.5)
NEUT%: 77.5 % — AB (ref 39.0–75.0)
Platelets: 174 10*3/uL (ref 140–400)
RBC: 4.04 10*6/uL — AB (ref 4.20–5.82)
RDW: 15.2 % — ABNORMAL HIGH (ref 11.0–14.6)
WBC: 12.5 10*3/uL — ABNORMAL HIGH (ref 4.0–10.3)
lymph#: 1.5 10*3/uL (ref 0.9–3.3)

## 2015-12-01 MED ORDER — PROCHLORPERAZINE MALEATE 10 MG PO TABS: 10.0000 mg | ORAL_TABLET | Freq: Four times a day (QID) | ORAL | Status: DC | PRN

## 2015-12-01 NOTE — Progress Notes (Signed)
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    ICD-9-CM ICD-10-CM   1. Primary cancer of right upper lobe of lung (HCC) 162.3 C34.11     HISTORY OF PRESENT ILLNESS:Paul Sandoval is a 75 y.o. male who is seen in the emergency department due hemoptysis. 3/19 CXR    During his workup, a CT angiogram on 11/11/2015 revealed a large right upper lobe mass.    He subsequently 3/23 underwent bronchoscopy and although original biopsies are negative, brushings revealed squamous cell carcinoma non-small cell carcinoma of the lung consistent with squamous cell carcinoma. PET scan on 11/25/2015 revealed a 3 x 5 cm mass in the right upper lobe with right paratracheal adenopathy and right hilar adenopathy. There was also an indeterminate subcarinal lymph node.      Because of these findings, he comes for further discussion of his care in the multidisciplinary thoracic oncology clinic.  PREVIOUS RADIATION THERAPY: No  PAST MEDICAL HISTORY:  Past Medical History  Diagnosis Date  . 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 AAA (abdominal aortic aneurysm) ([place]) 2010    4.4 cm 08/2008;4.44 in 7/10 and 4.65 in 08/2009; 4.8 by CT in 11/2009; 4.3 by ultrasound in 08/2010  . Hypertension   . Hyperlipidemia   . Tobacco abuse, in remission     40 pack year total consumption; discontinued in 1996  . GERD (gastroesophageal reflux disease)   . Right bundle branch block   . Diverticulosis   . Colonic polyp 2002    polypectomy in 2002  . COPD (chronic obstructive pulmonary disease) ([place])   . CAD (coronary artery disease)     03/18/14:   PCI with DES to distal left main. 7/29: DES to the SVG to Diag  . ED (erectile dysfunction)   . IFG (impaired fasting glucose)   . Chronic rhinitis   . Myocardial infarction [facility])     "told h/o silent MI sometime before 1996"  . Pneumonia ~ 2001; ~ 2005  . Chronic bronchitis ([place])   . Arthritis     "fingers" (03/18/2014)  . Cardiomyopathy, ischemic     Echo 03/17/14: EF 45-50%    PAST SURGICAL HISTORY: Past Surgical History  Procedure Laterality Date  . Colonoscopy  2002    polypectomy-patient denies  . Laparoscopic cholecystectomy  12/2009  . Abdominal aortic endovascular stent graft N/A 12/11/2012    Procedure: ABDOMINAL AORTIC ENDOVASCULAR STENT GRAFT;  Surgeon: Mal Misty, MD;  Location: [place];  Service: Vascular;  Laterality: N/A;  Ultrasound guided; [place]  . Abdominal aortic aneurysm repair  11/2012  . Total hip arthroplasty Left 01/21/2013    Procedure: TOTAL HIP ARTHROPLASTY ANTERIOR APPROACH;  Surgeon: Mauri Pole, MD;  Location: [place];  Service: Orthopedics;  Laterality: Left;  . Joint replacement    . Coronary artery bypass graft  01/09/1995    "CABG X3"  . Cardiac catheterization  01/08/1995  . Coronary angioplasty with stent placement  03/18/2014    "1"  . Coronary angioplasty with stent placement  03/24/2014    "1"  . Left and right heart catheterization  with coronary/graft angiogram N/A 03/18/2014    Procedure: LEFT AND RIGHT HEART CATHETERIZATION WITH Beatrix Fetters;  Surgeon: Blane Ohara, MD;  Location: [facility] CATH LAB;  Service: Cardiovascular;  Laterality: N/A;  . Percutaneous coronary stent intervention (pci-s)  03/18/2014    Procedure: PERCUTANEOUS CORONARY STENT INTERVENTION (PCI-S);  Surgeon: Blane Ohara, MD;  Location: [facility] CATH LAB;  Service: Cardiovascular;;  . Percutaneous coronary stent intervention (pci-s) N/A 03/24/2014    Procedure: PERCUTANEOUS CORONARY STENT INTERVENTION (PCI-S);  Surgeon: Blane Ohara, MD;  Location: [facility] CATH LAB;   Service: Cardiovascular;  Laterality: N/A;  . Video bronchoscopy N/A 11/17/2015    Procedure: VIDEO BRONCHOSCOPY WITH FLUORO;  Surgeon: Rigoberto Noel, MD;  Location: [place];  Service: Cardiopulmonary;  Laterality: N/A;    FAMILY HISTORY:  Family History  Problem Relation Age of Onset  . Colon cancer Neg Hx   . Colon polyps Neg Hx   . Heart disease Father   . Arthritis Mother   . Parkinsonism Mother   . Arthritis Sister     Brother with rheumatoid arthritis  . Cancer Father     Lung  . Hypertension Brother     SOCIAL HISTORY:  Social History   Social History  . Marital Status: Married    Spouse Name: N/A  . Number of Children: 1  . Years of Education: N/A   Occupational History  . Retired     [employer]   Social History Main Topics  . Smoking status: Former Smoker -- 1.50 packs/day for 30 years    Types: Cigarettes    Start date: 12/01/1956    Quit date: 01/08/1995  . Smokeless tobacco: Never Used  . Alcohol Use: 0.0 oz/week    0 Standard drinks or equivalent per week     Comment: 03/18/2014 "no alacohol since 1996"  . Drug Use: No  . Sexual Activity: No   Other Topics Concern  . Not on file   Social History Narrative  The patient is married and resides in [place].  ALLERGIES: Neomycin  MEDICATIONS:  Current Outpatient Prescriptions  Medication Sig Dispense Refill  . acetaminophen (TYLENOL) 500 MG tablet Take 500 mg by mouth every 6 (six) hours as needed for mild pain.    Marland Kitchen aspirin 81 MG tablet Take 81 mg by mouth daily. Pt will restart on 4/11    . Clopidogrel Bisulfate (PLAVIX PO) Take 75 mg by mouth daily. Restarted on 11/29/15    . enalapril (VASOTEC) 10 MG tablet Take one and a half tablets PO BID 90 tablet 5  . Ferrous Sulfate (IRON) 28 MG TABS Take 28 mg by mouth daily.    . hydrochlorothiazide (HYDRODIURIL) 25 MG tablet Take 0.5 tablets (12.5 mg total) by mouth daily.    . metoprolol (LOPRESSOR) 50 MG tablet Take 50 mg by mouth 2  (two) times daily.     . Multiple Vitamins-Minerals (CENTRUM SILVER ADULT 50+) TABS Take 1 tablet by mouth daily.     . nitroGLYCERIN (NITROSTAT) 0.4 MG SL tablet Place 1 tablet (0.4 mg total) under the tongue every 5 (five) minutes as needed for chest pain. 25 tablet 4  . omeprazole (PRILOSEC) 20 MG capsule Take 1 capsule (20 mg total) by mouth daily. 90 capsule 3  . predniSONE (DELTASONE) 10 MG tablet Take 2 tabs daily with food x 5ds, then 1 tab daily with food x 5ds then STOP 15 tablet 0  . prochlorperazine (COMPAZINE) 10 MG tablet Take 1 tablet (  10 mg total) by mouth every 6 (six) hours as needed for nausea or vomiting. 30 tablet 0  . simvastatin (ZOCOR) 80 MG tablet Take 0.5 tablets (40 mg total) by mouth at bedtime. 15 tablet 5  . VENTOLIN HFA 108 (90 Base) MCG/ACT inhaler INHALE 2 PUFFS BY MOUTH EVERY 4 TO 6 HOURS AS NEEDED FOR WHEEZING. 18 g 5   No current facility-administered medications for this encounter.    REVIEW OF SYSTEMS: All pertinent information is noted in the HPI. He is very active.     PHYSICAL EXAM: BP: 142/54, Pulse: 88, Temp: 97.4 F, Temp Source: Oral, Resp: 18, Ht: '5\' 10"'$ , Wt: 185 lb 3.2 oz, BMI: 26.57 kg/m2, SpO2: 98%  In general, this is a well appearing male in no acute distress. He is alert and oriented x4 and appropriate throughout the examination.   KPS = 100  100 - Normal; no complaints; no evidence of disease. 90   - Able to carry on normal activity; minor signs or symptoms of disease. 80   - Normal activity with effort; some signs or symptoms of disease. 55   - Cares for self; unable to carry on normal activity or to do active work. 60   - Requires occasional assistance, but is able to care for most of his personal needs. 50   - Requires considerable assistance and frequent medical care. 37   - Disabled; requires special care and assistance. 1   - Severely disabled; hospital admission is indicated although death not imminent. 68   - Very sick; hospital  admission necessary; active supportive treatment necessary. 10   - Moribund; fatal processes progressing rapidly. 0     - Dead  Karnofsky DA, Abelmann WH, Craver LS and Butner JH [phone]) The use of the nitrogen mustards in the palliative treatment of carcinoma: with particular reference to bronchogenic carcinoma Cancer 1 [id number]  LABORATORY DATA:  Lab Results  Component Value Date   WBC 12.5* 12/01/2015   HGB 11.1* 12/01/2015   HCT 34.8* 12/01/2015   MCV 86.2 12/01/2015   PLT 174 12/01/2015   Lab Results  Component Value Date   NA 135* 12/01/2015   K 4.4 12/01/2015   CL 100* 11/12/2015   CO2 28 12/01/2015   Lab Results  Component Value Date   ALT 19 12/01/2015   AST 20 12/01/2015   ALKPHOS 83 12/01/2015   BILITOT 0.52 12/01/2015     RADIOGRAPHY: Dg Chest 2 View  11/24/2015  CLINICAL DATA:  75 year old male with cough productive of yellow mucus for 7 days. Initial encounter. Right upper lobe lung mass discovered recently on radiographs and CT. Former smoker. EXAM: CHEST  2 VIEW COMPARISON:  11/17/2015 and earlier. FINDINGS: Indistinct right suprahilar lung mass re- demonstrated. No superimposed pneumothorax, pulmonary edema, pleural effusion or new pulmonary opacity. Stable cardiac size and mediastinal contours. Sequelae of CABG. Visualized tracheal air column is within normal limits. Stable visualized osseous structures. Partially visible surgical clips in the abdomen and bifurcated abdominal aortic endograft. IMPRESSION: 1. Right upper lobe lung mass re- demonstrated. See chest CTA report 11/11/2015. 2. No new cardiopulmonary abnormality. Electronically Signed   By: Genevie Ann M.D.   On: 11/24/2015 15:07   Ct Angio Chest Pe W/cm &/or Wo Cm  11/11/2015  CLINICAL DATA:  Hemoptysis. Masslike opacity in the right upper lobe on chest radiograph from earlier today. EXAM: CT ANGIOGRAPHY CHEST WITH CONTRAST TECHNIQUE: Multidetector CT imaging of the chest was performed using the standard  protocol during bolus administration of intravenous contrast. Multiplanar CT image reconstructions and MIPs were obtained to evaluate the vascular anatomy. CONTRAST:  111m OMNIPAQUE IOHEXOL 350 MG/ML SOLN COMPARISON:  Chest radiograph from earlier today. FINDINGS: Mediastinum/Nodes: The study is high quality for the evaluation of pulmonary embolism. There are no filling defects in the central, lobar, segmental or subsegmental pulmonary artery branches to suggest acute pulmonary embolism. Great vessels are normal in course and caliber. Normal heart size. No pericardial fluid/thickening. Coronary atherosclerosis status post CABG with left internal mammary and ascending aortic bypass grafts. Normal visualized thyroid. Normal esophagus. No axillary adenopathy. There a few mildly enlarged right paratracheal nodes, largest 1.4 cm (series 4/ image 32). There is a mildly enlarged 1.5 cm subcarinal node. There is a mildly enlarged 1.0 cm left upper prevascular node (series 4/ image 23). There is a mildly enlarged 1.4 cm right hilar node. No left hilar adenopathy. Lungs/Pleura: No pneumothorax. No pleural effusion. There are inspissated secretions layering throughout the lower tracheal and bilateral mainstem bronchi. There is a 5.3 x 2.9 cm lung mass in the anterior right upper lobe (series 5/ image 19), which demonstrates broad pleural attachment, with mild patchy ground-glass opacity surrounding the mass. There are mild patchy centrilobular ground-glass nodules in both lungs, most prominent in the right lower lobe and left upper lobe. No additional significant solid pulmonary nodules. Upper abdomen: Unremarkable. Musculoskeletal: No aggressive appearing focal osseous lesions. Moderate degenerative changes in the thoracic spine. Sternotomy wires appear aligned and intact. Review of the MIP images confirms the above findings. IMPRESSION: 1. No pulmonary embolism. 2. Anterior right upper lobe 5.3 x 2.9 cm lung mass, most  suggestive of a primary bronchogenic carcinoma. This mass abuts the anterior visceral pleura along a broad base. No pleural effusion. 3. Right hilar and subcarinal, right paratracheal and left upper prevascular mediastinal lymphadenopathy, suspicious for nodal metastases. 4. Recommend thoracic surgical consultation and further evaluation with PET-CT. 5. Inspissated secretions in the central airways. Mild patchy ground-glass centrilobular nodularity throughout both lungs, favor aspiration or airway hemorrhage given the history of hemoptysis. Electronically Signed   By: JIlona SorrelM.D.   On: 11/11/2015 22:04   Nm Pet Image Initial (pi) Skull Base To Thigh  11/25/2015  CLINICAL DATA:  Initial treatment strategy for solitary pulmonary nodule. EXAM: NUCLEAR MEDICINE PET SKULL BASE TO THIGH TECHNIQUE: 9.2 mCi F-18 FDG was injected intravenously. Full-ring PET imaging was performed from the skull base to thigh after the radiotracer. CT data was obtained and used for attenuation correction and anatomic localization. FASTING BLOOD GLUCOSE:  Value: 94 mg/dl COMPARISON:  CT 11/11/2015 FINDINGS: NECK No hypermetabolic lymph nodes in the neck. CHEST RIGHT upper lobe mass abutting the mediastinal pleural surface measures 4.3 x 3.4 cm with intense metabolic activity (SUV max equal 31.9). No additional hypermetabolic nodules. Small focus of nodular ground-glass density in the RIGHT middle lobe (image 49, series 6) is likely post infectious or inflammatory. Within the mediastinum there is enlarged hypermetabolic RIGHT lower paratracheal lymph node measuring 18 mm short axis with SUV max equal 13.4. There is hypermetabolic RIGHT hilar lymph node which is difficult to define on noncontrast CT. No contralateral hypermetabolic lymph nodes. No supraclavicular lymph nodes. ABDOMEN/PELVIS No abnormal hypermetabolic activity within the liver, pancreas, adrenal glands, or spleen. No hypermetabolic lymph nodes in the abdomen or pelvis.  Aortic stent graft noted. Postcholecystectomy. Sigmoid diverticulosis SKELETON No focal hypermetabolic activity to suggest skeletal metastasis. IMPRESSION: 1. Hypermetabolic RIGHT upper lobe mass abutting the pleural  surface consists with bronchogenic carcinoma. 2. Hypermetabolic RIGHT hilar and RIGHT lower paratracheal metastatic adenopathy. 3. No contralateral or supraclavicular hypermetabolic or enlarged lymph nodes. Electronically Signed   By: Suzy Bouchard M.D.   On: 11/25/2015 11:07   Dg Chest Port 1 View  11/17/2015  CLINICAL DATA:  Post biopsy and bronchoscopy, right upper lobe mass EXAM: PORTABLE CHEST 1 VIEW COMPARISON:  11/13/2015 FINDINGS: Cardiomediastinal silhouette is stable. Status post CABG. Again noted mass in right upper lobe medially. No infiltrate or pulmonary edema. There is no pneumothorax. IMPRESSION: No active disease. Again noted mass in right upper lobe medially. There is no pneumothorax. Electronically Signed   By: Lahoma Crocker M.D.   On: 11/17/2015 09:08   Dg Chest Port 1 View  11/13/2015  CLINICAL DATA:  Acute respiratory failure EXAM: PORTABLE CHEST 1 VIEW COMPARISON:  11/11/2015 chest radiograph. FINDINGS: Sternotomy wires appear aligned and intact. Stable cardiomediastinal silhouette with normal heart size. No pneumothorax. No pleural effusion. Stable apical right upper lobe lung mass. No pulmonary edema. No acute lung opacity. IMPRESSION: 1. Stable apical right upper lobe lung mass. 2. Otherwise no active disease in the chest. Electronically Signed   By: Ilona Sorrel M.D.   On: 11/13/2015 08:55   Dg Chest Portable 1 View  11/11/2015  CLINICAL DATA:  Hemoptysis. EXAM: PORTABLE CHEST 1 VIEW COMPARISON:  08/25/2014 chest radiograph. FINDINGS: Sternotomy wires appear aligned and intact. CABG clips overlie the mediastinum. Stable cardiomediastinal silhouette with top-normal heart size. No pneumothorax. No pleural effusion. There is a new focal masslike opacity in the medial  right upper lobe. No pulmonary edema. IMPRESSION: New focal masslike lung opacity in the medial right upper lobe, which is nonspecific and could represent a lung mass or focal infection. Recommend further evaluation with chest CT. Electronically Signed   By: Ilona Sorrel M.D.   On: 11/11/2015 20:45   Dg C-arm Bronchoscopy  11/17/2015  CLINICAL DATA:  C-ARM BRONCHOSCOPY Fluoroscopy was utilized by the requesting physician.  No radiographic interpretation.      IMPRESSION: Mr. 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  PLAN: Today, I talked to the patient and family about the findings and work-up thus far.  We discussed the natural history of stage IIIA squamous cell carcinoma and general treatment, highlighting the role of radiotherapy in the management. We discussed the available radiation techniques, and focused on the details of logistics and delivery.  We reviewed the anticipated acute and late sequelae associated with radiation in this setting. The patient was encouraged to ask questions that I answered to the best of my ability.  I filled out a patient counseling form during our discussion including treatment diagrams.  We retained a copy for our records. The patient would like to proceed with radiation. CT simulation is scheduled for tomorrow, 12/02/2015, at 10:00 AM.  I spent 15 minutes minutes face to face with the patient and more than 50% of that time was spent in counseling and/or coordination of care.   The above documentation reflects my direct findings during this shared patient visit. Please see the separate note by Dr. Tammi Klippel on this date for the remainder of the patient's plan of care.    Carola Rhine, PAC   This document serves as a record of services personally performed by Shona Simpson, PA and Tyler Pita, MD. It was created on their behalf by Lurlean Leyden  Piggott, a trained medical scribe.  The creation of this record is based on the scribe's personal observations and the provider's statements to them. This document has been checked and approved by the attending provider.

## 2015-12-01 NOTE — Progress Notes (Signed)
Oncology Nurse Navigator Documentation  Oncology Nurse Navigator Flowsheets 12/01/2015  Navigator Encounter Type Clinic/MDC  Telephone -  Abnormal Finding Date 11/11/2015  Confirmed Diagnosis Date 12/18/2015  Treatment Phase Pre-Tx/Tx Discussion  Barriers/Navigation Needs Education  Education Newly Diagnosed Cancer Education  Interventions Education Method  Coordination of Care -  Education Method Verbal;Written  Acuity Level 2  Acuity Level 1 Initial guidance, education and coordination as needed  Time Spent with Patient 47   Spoke with patient and wife today.  Gave and explained information on lung cancer, resources, and next steps.   4/10 Chemo Education

## 2015-12-01 NOTE — Progress Notes (Deleted)
75 yo man with    3/19 CXR    3/23 bronch biopsy negative brushings positive for squamous cell carcinoma   3/30 CT    3/31 PET

## 2015-12-01 NOTE — Progress Notes (Signed)
Paul [phone]   [phone] Multidisciplinary thoracic oncology clinic  CONSULT NOTE  REFERRING PHYSICIAN: Dr. Kara Mead  REASON FOR CONSULTATION:  75 years old white male recently diagnosed with lung cancer.  HPI Paul Sandoval is a 75 y.o. male was past medical history significant for coronary artery disease status post Sandoval, Paul Sandoval, GERD, abdominal aortic aneurysm, cardiomyopathy, chronic asthma, chronic kidney disease, COPD and history of smoking but quit more than 20 years ago. The patient presented to [facility] on 11/11/2015 complaining of cough, shortness of breath as well as hemoptysis started few hours before presentation. He was on anticoagulation with Plavix and aspirin. During his evaluation chest x-ray was performed on 11/11/2015 and it showed new focal masslike lung opacity in the medial right upper lobe questionable for lung mass or local infection. This was followed by CT angiogram of the chest on the same day and it showed anterior right upper lobe 5.3 x 2.9 cm lung mass most suggestive of primary bronchogenic carcinoma. The mass abuts the anterior visceral pleura along the road base but no pleural effusion. There was right hilar and subcarinal as well as a right paratracheal and left upper prevascular mediastinal lymphadenopathy suspicious for nodal metastasis. He was transferred to [facility] and seen by Dr. Elsworth Soho. He underwent a video bronchoscopy on 11/17/2015 by Dr. Elsworth Soho and the final cytology (Accession: [id number]) with bronchial washing and brushing showed malignant cells consistent with non-small cell carcinoma favoring squamous cell carcinoma. A PET scan was performed on 11/25/2015 and it showed right upper lobe mass abutting the mediastinal pleural service and measured 4.3 x 3.4 cm was intense metabolic activity equivalent to 31.9. There was a small focus of nodular groundglass density in the right  middle lobe likely infectious or inflammatory. Within the mediastinum there was enlarged hypermetabolic right lower paratracheal lymph node measuring 1.8 cm in short axis with SUV max of 13.4 there was also hypermetabolic right hilar lymph node and no contralateral or supraclavicular hypermetabolic lymph nodes. There was no evidence of distant metastatic disease. Dr. Elsworth Soho kindly referred the patient to the multidisciplinary thoracic oncology clinic today for evaluation and recommendation regarding treatment of his condition. When seen today the patient is feeling fine with no specific complaints except for mild cough but denied having any significant chest pain or shortness breath. He has no more hemoptysis since his admission on 11/11/2015. He lost around 5 pounds in the last few weeks. He denied having any significant nausea, vomiting, diarrhea or constipation. He denied having any headache or visual changes. Family history significant for mother with arthritis and parkinsonism, father had lung cancer and heart disease and a sister with ovarian cancer. The patient is married and has one son. He was accompanied today by his wife, Paul Sandoval. He is currently retired and used to work as an Special educational needs teacher with [employer]. He has a history of smoking more than one pack per day for around 40 years but quit 1996. No history of alcohol or drug abuse.  HPI  Past Medical History  Diagnosis Date  . Arteriosclerotic cardiovascular disease (ASCVD) 1996    Sandoval-1996  . AAA (abdominal aortic aneurysm) ([place]) 2010    4.4 cm 08/2008;4.44 in 7/10 and 4.65 in 08/2009; 4.8 by CT in 11/2009; 4.3 by ultrasound in 08/2010  . Paul Sandoval   . Hyperlipidemia   . Tobacco abuse, in remission     40 pack year total consumption; discontinued in  1996  . GERD (gastroesophageal reflux disease)   . Right bundle branch block   . Diverticulosis   . Colonic polyp 2002    polypectomy in 2002  . COPD (chronic  obstructive pulmonary disease) ([place])   . CAD (coronary artery disease)     03/18/14:  PCI with DES to distal left main. 7/29: DES to the SVG to Diag  . ED (erectile dysfunction)   . IFG (impaired fasting glucose)   . Chronic rhinitis   . Myocardial infarction [facility])     "told h/o silent MI sometime before 1996"  . Pneumonia ~ 2001; ~ 2005  . Chronic bronchitis ([place])   . Arthritis     "fingers" (03/18/2014)  . Cardiomyopathy, ischemic     Echo 03/17/14: EF 45-50%    Past Surgical History  Procedure Laterality Date  . Colonoscopy  2002    polypectomy-patient denies  . Laparoscopic cholecystectomy  12/2009  . Abdominal aortic endovascular stent graft N/A 12/11/2012    Procedure: ABDOMINAL AORTIC ENDOVASCULAR STENT GRAFT;  Surgeon: Mal Misty, MD;  Location: [place];  Service: Vascular;  Laterality: N/A;  Ultrasound guided; [place]  . Abdominal aortic aneurysm repair  11/2012  . Total hip arthroplasty Left 01/21/2013    Procedure: TOTAL HIP ARTHROPLASTY ANTERIOR APPROACH;  Surgeon: Mauri Pole, MD;  Location: [place];  Service: Orthopedics;  Laterality: Left;  . Joint replacement    . Coronary artery bypass graft  01/09/1995    "Sandoval X3"  . Cardiac catheterization  01/08/1995  . Coronary angioplasty with stent placement  03/18/2014    "1"  . Coronary angioplasty with stent placement  03/24/2014    "1"  . Left and right heart catheterization with coronary/graft angiogram N/A 03/18/2014    Procedure: LEFT AND RIGHT HEART CATHETERIZATION WITH Beatrix Fetters;  Surgeon: Blane Ohara, MD;  Location: [facility] CATH LAB;  Service: Cardiovascular;  Laterality: N/A;  . Percutaneous coronary stent intervention (pci-s)  03/18/2014    Procedure: PERCUTANEOUS CORONARY STENT INTERVENTION (PCI-S);  Surgeon: Blane Ohara, MD;  Location: [facility] CATH LAB;  Service: Cardiovascular;;  . Percutaneous coronary stent intervention (pci-s) N/A 03/24/2014    Procedure: PERCUTANEOUS CORONARY STENT INTERVENTION  (PCI-S);  Surgeon: Blane Ohara, MD;  Location: [facility] CATH LAB;  Service: Cardiovascular;  Laterality: N/A;  . Video bronchoscopy N/A 11/17/2015    Procedure: VIDEO BRONCHOSCOPY WITH FLUORO;  Surgeon: Rigoberto Noel, MD;  Location: [place];  Service: Cardiopulmonary;  Laterality: N/A;    Family History  Problem Relation Age of Onset  . Colon cancer Neg Hx   . Colon polyps Neg Hx   . Heart disease Father   . Arthritis Mother   . Parkinsonism Mother   . Arthritis Sister     Brother with rheumatoid arthritis  . Cancer Father     Lung  . Paul Sandoval Brother     Social History Social History  Substance Use Topics  . Smoking status: Former Smoker -- 1.50 packs/day for 30 years    Types: Cigarettes    Start date: 12/01/1956    Quit date: 01/08/1995  . Smokeless tobacco: Never Used  . Alcohol Use: 0.0 oz/week    0 Standard drinks or equivalent per week     Comment: 03/18/2014 "no alacohol since 1996"    Allergies  Allergen Reactions  . Neomycin     Hives     Current Outpatient Prescriptions  Medication Sig Dispense Refill  . acetaminophen (TYLENOL) 500 MG  tablet Take 500 mg by mouth every 6 (six) hours as needed for mild pain.    Marland Kitchen aspirin 81 MG tablet Take 81 mg by mouth daily. Pt will restart on 4/11    . Clopidogrel Bisulfate (PLAVIX PO) Take 75 mg by mouth daily. Restarted on 11/29/15    . enalapril (VASOTEC) 10 MG tablet Take one and a half tablets PO BID 90 tablet 5  . Ferrous Sulfate (IRON) 28 MG TABS Take 28 mg by mouth daily.    . hydrochlorothiazide (HYDRODIURIL) 25 MG tablet Take 0.5 tablets (12.5 mg total) by mouth daily.    . metoprolol (LOPRESSOR) 50 MG tablet Take 50 mg by mouth 2 (two) times daily.     . Multiple Vitamins-Minerals (CENTRUM SILVER ADULT 50+) TABS Take 1 tablet by mouth daily.     . nitroGLYCERIN (NITROSTAT) 0.4 MG SL tablet Place 1 tablet (0.4 mg total) under the tongue every 5 (five) minutes as needed for chest pain. 25 tablet 4  .  omeprazole (PRILOSEC) 20 MG capsule Take 1 capsule (20 mg total) by mouth daily. 90 capsule 3  . predniSONE (DELTASONE) 10 MG tablet Take 2 tabs daily with food x 5ds, then 1 tab daily with food x 5ds then STOP 15 tablet 0  . simvastatin (ZOCOR) 80 MG tablet Take 0.5 tablets (40 mg total) by mouth at bedtime. 15 tablet 5  . VENTOLIN HFA 108 (90 Base) MCG/ACT inhaler INHALE 2 PUFFS BY MOUTH EVERY 4 TO 6 HOURS AS NEEDED FOR WHEEZING. 18 g 5   No current facility-administered medications for this visit.    Review of Systems  Constitutional: negative Eyes: negative Ears, nose, mouth, throat, and face: negative Respiratory: positive for cough Cardiovascular: negative Gastrointestinal: negative Genitourinary:negative Integument/breast: negative Hematologic/lymphatic: negative Musculoskeletal:negative Neurological: negative Behavioral/Psych: negative Endocrine: negative Allergic/Immunologic: negative  Physical Exam  NOB:SJGGE, healthy, no distress, well nourished and well developed SKIN: skin color, texture, turgor are normal, no rashes or significant lesions HEAD: Normocephalic, No masses, lesions, tenderness or abnormalities EYES: normal, PERRLA, Conjunctiva are pink and non-injected EARS: External ears normal, Canals clear OROPHARYNX:no exudate, no erythema and lips, buccal mucosa, and tongue normal  NECK: supple, no adenopathy, no JVD LYMPH:  no palpable lymphadenopathy, no hepatosplenomegaly LUNGS: prolonged expiratory phase, expiratory wheezes bilaterally HEART: regular rate & rhythm, no murmurs and no gallops ABDOMEN:abdomen soft, non-tender, normal bowel sounds and no masses or organomegaly BACK: Back symmetric, no curvature., No CVA tenderness EXTREMITIES:no joint deformities, effusion, or inflammation, no edema, no skin discoloration  NEURO: alert & oriented x 3 with fluent speech, no focal motor/sensory deficits  PERFORMANCE STATUS: ECOG 1  LABORATORY DATA: Lab  Results  Component Value Date   WBC 12.5* 12/01/2015   HGB 11.1* 12/01/2015   HCT 34.8* 12/01/2015   MCV 86.2 12/01/2015   PLT 174 12/01/2015      Chemistry      Component Value Date/Time   NA 137 11/12/2015 0357   NA 140 08/23/2015 0853   K 4.3 11/12/2015 0357   CL 100* 11/12/2015 0357   CO2 27 11/12/2015 0357   BUN 21* 11/12/2015 0357   BUN 16 08/23/2015 0853   CREATININE 1.34* 11/12/2015 0357   CREATININE 1.63* 08/18/2014 0758      Component Value Date/Time   CALCIUM 8.9 11/12/2015 0357   ALKPHOS 79 11/11/2015 2019   AST 22 11/11/2015 2019   ALT 16* 11/11/2015 2019   BILITOT 0.5 11/11/2015 2019   BILITOT 0.6 08/23/2015 3662  RADIOGRAPHIC STUDIES: Dg Chest 2 View  11/24/2015  CLINICAL DATA:  75 year old male with cough productive of yellow mucus for 7 days. Initial encounter. Right upper lobe lung mass discovered recently on radiographs and CT. Former smoker. EXAM: CHEST  2 VIEW COMPARISON:  11/17/2015 and earlier. FINDINGS: Indistinct right suprahilar lung mass re- demonstrated. No superimposed pneumothorax, pulmonary edema, pleural effusion or new pulmonary opacity. Stable cardiac size and mediastinal contours. Sequelae of Sandoval. Visualized tracheal air column is within normal limits. Stable visualized osseous structures. Partially visible surgical clips in the abdomen and bifurcated abdominal aortic endograft. IMPRESSION: 1. Right upper lobe lung mass re- demonstrated. See chest CTA report 11/11/2015. 2. No new cardiopulmonary abnormality. Electronically Signed   By: Genevie Ann M.D.   On: 11/24/2015 15:07   Ct Angio Chest Pe W/cm &/or Wo Cm  11/11/2015  CLINICAL DATA:  Hemoptysis. Masslike opacity in the right upper lobe on chest radiograph from earlier today. EXAM: CT ANGIOGRAPHY CHEST WITH CONTRAST TECHNIQUE: Multidetector CT imaging of the chest was performed using the standard protocol during bolus administration of intravenous contrast. Multiplanar CT image  reconstructions and MIPs were obtained to evaluate the vascular anatomy. CONTRAST:  155m OMNIPAQUE IOHEXOL 350 MG/ML SOLN COMPARISON:  Chest radiograph from earlier today. FINDINGS: Mediastinum/Nodes: The study is high quality for the evaluation of pulmonary embolism. There are no filling defects in the central, lobar, segmental or subsegmental pulmonary artery branches to suggest acute pulmonary embolism. Great vessels are normal in course and caliber. Normal heart size. No pericardial fluid/thickening. Coronary atherosclerosis status post Sandoval with left internal mammary and ascending aortic bypass grafts. Normal visualized thyroid. Normal esophagus. No axillary adenopathy. There a few mildly enlarged right paratracheal nodes, largest 1.4 cm (series 4/ image 32). There is a mildly enlarged 1.5 cm subcarinal node. There is a mildly enlarged 1.0 cm left upper prevascular node (series 4/ image 23). There is a mildly enlarged 1.4 cm right hilar node. No left hilar adenopathy. Lungs/Pleura: No pneumothorax. No pleural effusion. There are inspissated secretions layering throughout the lower tracheal and bilateral mainstem bronchi. There is a 5.3 x 2.9 cm lung mass in the anterior right upper lobe (series 5/ image 19), which demonstrates broad pleural attachment, with mild patchy ground-glass opacity surrounding the mass. There are mild patchy centrilobular ground-glass nodules in both lungs, most prominent in the right lower lobe and left upper lobe. No additional significant solid pulmonary nodules. Upper abdomen: Unremarkable. Musculoskeletal: No aggressive appearing focal osseous lesions. Moderate degenerative changes in the thoracic spine. Sternotomy wires appear aligned and intact. Review of the MIP images confirms the above findings. IMPRESSION: 1. No pulmonary embolism. 2. 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 This mass abuts the anterior visceral  pleura along a broad base. No pleural effusion. 3. Right hilar and subcarinal, right paratracheal and left upper prevascular mediastinal lymphadenopathy, suspicious for nodal metastases. 4. Recommend thoracic surgical consultation and further evaluation with PET-CT. 5. Inspissated secretions in the central airways. Mild patchy ground-glass centrilobular nodularity throughout both lungs, favor aspiration or airway hemorrhage given the history of hemoptysis. Electronically Signed   By: JIlona SorrelM.D.   On: 11/11/2015 22:04   Nm Pet Image Initial (pi) Skull Base To Thigh  11/25/2015  CLINICAL DATA:  Initial treatment strategy for solitary pulmonary nodule. EXAM: NUCLEAR MEDICINE PET SKULL BASE TO THIGH TECHNIQUE: 9.2 mCi F-18 FDG was injected intravenously. Full-ring PET imaging was performed from the  skull base to thigh after the radiotracer. CT data was obtained and used for attenuation correction and anatomic localization. FASTING BLOOD GLUCOSE:  Value: 94 mg/dl COMPARISON:  CT 11/11/2015 FINDINGS: NECK No hypermetabolic lymph nodes in the neck. CHEST RIGHT upper lobe mass abutting the mediastinal pleural surface measures 4.3 x 3.4 cm with intense metabolic activity (SUV max equal 31.9). No additional hypermetabolic nodules. Small focus of nodular ground-glass density in the RIGHT middle lobe (image 49, series 6) is likely post infectious or inflammatory. Within the mediastinum there is enlarged hypermetabolic RIGHT lower paratracheal lymph node measuring 18 mm short axis with SUV max equal 13.4. There is hypermetabolic RIGHT hilar lymph node which is difficult to define on noncontrast CT. No contralateral hypermetabolic lymph nodes. No supraclavicular lymph nodes. ABDOMEN/PELVIS No abnormal hypermetabolic activity within the liver, pancreas, adrenal glands, or spleen. No hypermetabolic lymph nodes in the abdomen or pelvis. Aortic stent graft noted. Postcholecystectomy. Sigmoid diverticulosis SKELETON No focal  hypermetabolic activity to suggest skeletal metastasis. IMPRESSION: 1. Hypermetabolic RIGHT upper lobe mass abutting the pleural surface consists with bronchogenic carcinoma. 2. Hypermetabolic RIGHT hilar and RIGHT lower paratracheal metastatic adenopathy. 3. No contralateral or supraclavicular hypermetabolic or enlarged lymph nodes. Electronically Signed   By: Suzy Bouchard M.D.   On: 11/25/2015 11:07   Dg Chest Port 1 View  11/17/2015  CLINICAL DATA:  Post biopsy and bronchoscopy, right upper lobe mass EXAM: PORTABLE CHEST 1 VIEW COMPARISON:  11/13/2015 FINDINGS: Cardiomediastinal silhouette is stable. Status post Sandoval. Again noted mass in right upper lobe medially. No infiltrate or pulmonary edema. There is no pneumothorax. IMPRESSION: No active disease. Again noted mass in right upper lobe medially. There is no pneumothorax. Electronically Signed   By: Lahoma Crocker M.D.   On: 11/17/2015 09:08   Dg Chest Port 1 View  11/13/2015  CLINICAL DATA:  Acute respiratory failure EXAM: PORTABLE CHEST 1 VIEW COMPARISON:  11/11/2015 chest radiograph. FINDINGS: Sternotomy wires appear aligned and intact. Stable cardiomediastinal silhouette with normal heart size. No pneumothorax. No pleural effusion. Stable apical right upper lobe lung mass. No pulmonary edema. No acute lung opacity. IMPRESSION: 1. Stable apical right upper lobe lung mass. 2. Otherwise no active disease in the chest. Electronically Signed   By: Ilona Sorrel M.D.   On: 11/13/2015 08:55   Dg Chest Portable 1 View  11/11/2015  CLINICAL DATA:  Hemoptysis. EXAM: PORTABLE CHEST 1 VIEW COMPARISON:  08/25/2014 chest radiograph. FINDINGS: Sternotomy wires appear aligned and intact. Sandoval clips overlie the mediastinum. Stable cardiomediastinal silhouette with top-normal heart size. No pneumothorax. No pleural effusion. There is a new focal masslike opacity in the medial right upper lobe. No pulmonary edema. IMPRESSION: New focal masslike lung opacity in the  medial right upper lobe, which is nonspecific and could represent a lung mass or focal infection. Recommend further evaluation with chest CT. Electronically Signed   By: Ilona Sorrel M.D.   On: 11/11/2015 20:45   Dg C-arm Bronchoscopy  11/17/2015  CLINICAL DATA:  C-ARM BRONCHOSCOPY Fluoroscopy was utilized by the requesting physician.  No radiographic interpretation.    ASSESSMENT: This is a very pleasant 75 years old white male recently diagnosed with stage IIIA (T2b, N2, M0) non-small cell lung cancer presented with right upper lobe lung mass in addition to mediastinal lymphadenopathy diagnosed in March 2017.   PLAN: I had a lengthy discussion with the patient and his wife today about his current disease stage, prognosis and treatment options. I recommended for the patient to  complete the staging workup by ordering a MRI of the brain to rule out brain metastasis. I discussed with the patient treatment options for stage IIIa non-small cell lung cancer. I recommended for the patient a course of concurrent chemoradiation with weekly carboplatin for AUC of 2 and paclitaxel 45 MG/M2. I discussed with the patient adverse effect of the chemotherapy including but not limited to alopecia, myelosuppression, nausea and vomiting, peripheral neuropathy, liver or renal dysfunction. I will arrange for the patient to have a chemotherapy education class before starting the first dose of his chemotherapy. I will call his pharmacy with prescription for Compazine 10 mg by mouth every 6 hours as needed for nausea. The patient will see Dr. Tammi Klippel later today for evaluation and discussion of the radiotherapy option. He was seen in the multidisciplinary thoracic oncology clinic today by medical oncology, radiation oncology, thoracic navigator, social worker and physical therapist. For the hyperkalemia, I will repeat potassium level and the patient may need to reconsult with his primary care physician regarding treatment  with Vasotec as a possible cause for his hypercalcemia. He would come back for follow-up visit in 3 weeks for evaluation and management any adverse effect of his treatment. The patient was advised to call immediately if he has any concerning symptoms in the interval. The patient voices understanding of current disease status and treatment options and is in agreement with the current care plan.  All questions were answered. The patient knows to call the clinic with any problems, questions or concerns. We can certainly see the patient much sooner if necessary.  Thank you so much for allowing me to participate in the care of SULLY DYMENT. I will continue to follow up the patient with you and assist in his care.  I spent 55 minutes counseling the patient face to face. The total time spent in the appointment was 80 minutes.  Disclaimer: This note was dictated with voice recognition software. Similar sounding words can inadvertently be transcribed and may not be corrected upon review.   Heavin Sebree K. December 01, 2015, 3:36 PM

## 2015-12-01 NOTE — Telephone Encounter (Signed)
per pof to sch pt appt-sent MW email to sh trmt-adv pt to get updated copy from chemo class on 4/10

## 2015-12-01 NOTE — Progress Notes (Signed)
[place] Clinical Social Work  Clinical Social Work met with patient/family at [employer] appointment to offer support and assess for psychosocial needs.  The patient was accompanied by his spouse.  He reported no concerns and a "0" on his distress thermometer.  He reported he is only concerned with "coming up with a plan to fix this [referring to cancer]".  The patient has one son, whom lives at home and is supportive.    Clinical Social Work briefly discussed Clinical Social Work role and [employer] support programs/services.  Clinical Social Work encouraged patient to call with any additional questions or concerns.   Polo Riley, MSW, LCSW, OSW-C Clinical Social Worker [facility] [phone]

## 2015-12-02 ENCOUNTER — Ambulatory Visit
Admission: RE | Admit: 2015-12-02 | Discharge: 2015-12-02 | Disposition: A | Discharge status: Home / Self Care | Payer: Medicare Other | Source: Ambulatory Visit | Attending: Radiation Oncology | Admitting: Radiation Oncology

## 2015-12-02 ENCOUNTER — Encounter: Payer: Self-pay | Admitting: *Deleted

## 2015-12-02 ENCOUNTER — Telehealth: Payer: Self-pay | Admitting: *Deleted

## 2015-12-02 VITALS — BP 129/54 | HR 72 | Resp 16 | Wt 184.5 lb

## 2015-12-02 DIAGNOSIS — C3411 Malignant neoplasm of upper lobe, right bronchus or lung: Secondary | ICD-10-CM | POA: Diagnosis not present

## 2015-12-02 DIAGNOSIS — Z510 Encounter for antineoplastic radiation therapy: Secondary | ICD-10-CM | POA: Diagnosis not present

## 2015-12-02 DIAGNOSIS — R918 Other nonspecific abnormal finding of lung field: Secondary | ICD-10-CM

## 2015-12-02 NOTE — Progress Notes (Signed)
  Radiation Oncology         [phone]) [phone] ________________________________  [name] MRN: [id number]  Date: 12/02/2015  DOB: 1941/06/28  SIMULATION AND TREATMENT PLANNING NOTE    ICD-9-CM ICD-10-CM   1. 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  NARRATIVE:  The patient was brought to the [facility].  Identity was confirmed.  All relevant records and images related to the planned course of therapy were reviewed.  The patient freely provided informed written consent to proceed with treatment after reviewing the details related to the planned course of therapy. The consent form was witnessed and verified by the simulation staff.  Then, the patient was set-up in a stable reproducible  supine position for radiation therapy.  CT images were obtained.  Surface markings were placed.  The CT images were loaded into the planning software.  Then the target and avoidance structures were contoured.  Treatment planning then occurred.  The radiation prescription was entered and confirmed.  Then, I designed and supervised the construction of a total of 6 medically necessary complex treatment devices, including a BodyFix immobilization mold custom fitted to the patient along with 5 multileaf collimators conformally shaped radiation around the treatment target while shielding critical structures such as the heart and spinal cord maximally.  I have requested : 3D Simulation  I have requested a DVH of the following structures: Left lung, right lung, spinal cord, heart, esophagus, and target.  I have ordered:Nutrition Consult  SPECIAL TREATMENT PROCEDURE:  The planned course of therapy using radiation constitutes a special treatment procedure. Special care is required in the management of this patient for the following reasons.  The patient will  be receiving concurrent chemotherapy requiring careful monitoring for increased toxicities of treatment including periodic laboratory values.  The special nature of the planned course of radiotherapy will require increased physician supervision and oversight to ensure patient's safety with optimal treatment outcomes.  PLAN:  The patient will receive 66 Gy in 33 fractions.  ________________________________  Sheral Apley Tammi Klippel, M.D.  This document serves as a record of services personally performed by Tyler Pita, MD. It was created on his behalf by Arlyce Harman, a trained medical scribe. The creation of this record is based on the scribe's personal observations and the provider's statements to them. This document has been checked and approved by the attending provider.

## 2015-12-02 NOTE — Telephone Encounter (Signed)
Per staff message and POF I have scheduled appts. Advised scheduler of appts. JMW  

## 2015-12-02 NOTE — Progress Notes (Signed)
Denies a history of radiation therapy. Denies having a pacemaker. Reports he walks two miles per day. Denies pain. Vitals stable. Disability parking placard completed, signed by Tammi Klippel and given to the patient.

## 2015-12-05 ENCOUNTER — Telehealth: Payer: Self-pay | Admitting: *Deleted

## 2015-12-05 ENCOUNTER — Other Ambulatory Visit: Payer: Medicare Other

## 2015-12-05 NOTE — Telephone Encounter (Signed)
Oncology Nurse Navigator Documentation  Oncology Nurse Navigator Flowsheets 12/05/2015  Navigator Encounter Type Telephone  Telephone Outgoing Call  Treatment Phase Pre-Tx/Tx Discussion  Barriers/Navigation Needs Education  Education Other  Interventions Education Method  Education Method Written;Verbal  Acuity Level 2  Time Spent with Patient 30   Patient called and left me a vm message.  He is confused about appt.  I clarified.  I asked if he would like me to print and mail him a copy of his schedule.  He was so thankful for my help.  I printed his schedule and placed in the mail

## 2015-12-06 ENCOUNTER — Telehealth: Payer: Self-pay | Admitting: *Deleted

## 2015-12-06 NOTE — Telephone Encounter (Signed)
Oncology Nurse Navigator Documentation  Oncology Nurse Navigator Flowsheets 12/06/2015  Navigator Encounter Type Telephone  Telephone Incoming Call  Treatment Phase Pre-Tx/Tx Discussion  Barriers/Navigation Needs Education  Education Other  Interventions Education Method  Education Method Verbal  Acuity Level 1  Time Spent with Patient 15   Paul Sandoval called today.  He had a question about him using a foam from the dentist on his teeth 3 times a week.  I told him I would update Dr. Julien Nordmann.

## 2015-12-08 ENCOUNTER — Ambulatory Visit (HOSPITAL_COMMUNITY)
Admission: RE | Admit: 2015-12-08 | Discharge: 2015-12-08 | Disposition: A | Discharge status: Home / Self Care | Payer: Medicare Other | Source: Ambulatory Visit | Attending: Internal Medicine | Admitting: Internal Medicine

## 2015-12-08 DIAGNOSIS — Z510 Encounter for antineoplastic radiation therapy: Secondary | ICD-10-CM | POA: Diagnosis not present

## 2015-12-08 DIAGNOSIS — G3189 Other specified degenerative diseases of nervous system: Secondary | ICD-10-CM | POA: Insufficient documentation

## 2015-12-08 DIAGNOSIS — C3411 Malignant neoplasm of upper lobe, right bronchus or lung: Secondary | ICD-10-CM | POA: Insufficient documentation

## 2015-12-08 DIAGNOSIS — C3490 Malignant neoplasm of unspecified part of unspecified bronchus or lung: Secondary | ICD-10-CM | POA: Diagnosis not present

## 2015-12-08 MED ORDER — GADOBENATE DIMEGLUMINE 529 MG/ML IV SOLN
10.0000 mL | Freq: Once | INTRAVENOUS | Status: AC | PRN
  Administered 2015-12-08: 8 mL via INTRAVENOUS

## 2015-12-09 ENCOUNTER — Ambulatory Visit
Admission: RE | Admit: 2015-12-09 | Discharge: 2015-12-09 | Disposition: A | Discharge status: Home / Self Care | Payer: Medicare Other | Source: Ambulatory Visit | Attending: Radiation Oncology | Admitting: Radiation Oncology

## 2015-12-09 ENCOUNTER — Other Ambulatory Visit: Payer: Self-pay | Admitting: Medical Oncology

## 2015-12-09 DIAGNOSIS — C3411 Malignant neoplasm of upper lobe, right bronchus or lung: Secondary | ICD-10-CM | POA: Diagnosis not present

## 2015-12-09 DIAGNOSIS — Z510 Encounter for antineoplastic radiation therapy: Secondary | ICD-10-CM | POA: Diagnosis not present

## 2015-12-12 ENCOUNTER — Ambulatory Visit (HOSPITAL_BASED_OUTPATIENT_CLINIC_OR_DEPARTMENT_OTHER): Payer: Medicare Other | Admitting: Lab

## 2015-12-12 ENCOUNTER — Encounter: Payer: Self-pay | Admitting: Internal Medicine

## 2015-12-12 ENCOUNTER — Ambulatory Visit
Admission: RE | Admit: 2015-12-12 | Discharge: 2015-12-12 | Disposition: A | Discharge status: Home / Self Care | Payer: Medicare Other | Source: Ambulatory Visit | Attending: Radiation Oncology | Admitting: Radiation Oncology

## 2015-12-12 ENCOUNTER — Ambulatory Visit (HOSPITAL_BASED_OUTPATIENT_CLINIC_OR_DEPARTMENT_OTHER): Payer: Medicare Other

## 2015-12-12 VITALS — BP 129/60 | HR 77 | Temp 97.5°F | Resp 18

## 2015-12-12 DIAGNOSIS — C3411 Malignant neoplasm of upper lobe, right bronchus or lung: Secondary | ICD-10-CM

## 2015-12-12 DIAGNOSIS — Z510 Encounter for antineoplastic radiation therapy: Secondary | ICD-10-CM | POA: Diagnosis not present

## 2015-12-12 DIAGNOSIS — Z5111 Encounter for antineoplastic chemotherapy: Secondary | ICD-10-CM | POA: Diagnosis not present

## 2015-12-12 LAB — CBC WITH DIFFERENTIAL/PLATELET
BASO%: 0.3 % (ref 0.0–2.0)
Basophils Absolute: 0 10*3/uL (ref 0.0–0.1)
EOS%: 1.8 % (ref 0.0–7.0)
Eosinophils Absolute: 0.1 10*3/uL (ref 0.0–0.5)
HCT: 31.9 % — ABNORMAL LOW (ref 38.4–49.9)
HGB: 10.5 g/dL — ABNORMAL LOW (ref 13.0–17.1)
LYMPH%: 14.5 % (ref 14.0–49.0)
MCH: 27.9 pg (ref 27.2–33.4)
MCHC: 32.8 g/dL (ref 32.0–36.0)
MCV: 85.1 fL (ref 79.3–98.0)
MONO#: 0.8 10*3/uL (ref 0.1–0.9)
MONO%: 11 % (ref 0.0–14.0)
NEUT#: 5 10*3/uL (ref 1.5–6.5)
NEUT%: 72.4 % (ref 39.0–75.0)
Platelets: 175 10*3/uL (ref 140–400)
RBC: 3.76 10*6/uL — AB (ref 4.20–5.82)
RDW: 15.1 % — ABNORMAL HIGH (ref 11.0–14.6)
WBC: 7 10*3/uL (ref 4.0–10.3)
lymph#: 1 10*3/uL (ref 0.9–3.3)

## 2015-12-12 LAB — COMPREHENSIVE METABOLIC PANEL
ALT: 19 U/L (ref 0–55)
ANION GAP: 8 meq/L (ref 3–11)
AST: 20 U/L (ref 5–34)
Albumin: 2.9 g/dL — ABNORMAL LOW (ref 3.5–5.0)
Alkaline Phosphatase: 82 U/L (ref 40–150)
BUN: 15.9 mg/dL (ref 7.0–26.0)
CHLORIDE: 98 meq/L (ref 98–109)
CO2: 27 meq/L (ref 22–29)
CREATININE: 1.3 mg/dL (ref 0.7–1.3)
Calcium: 9.6 mg/dL (ref 8.4–10.4)
EGFR: 52 mL/min/{1.73_m2} — ABNORMAL LOW (ref >90)
Glucose: 84 mg/dl (ref 70–140)
POTASSIUM: 4.9 meq/L (ref 3.5–5.1)
Sodium: 134 mEq/L — ABNORMAL LOW (ref 136–145)
Total Bilirubin: 0.41 mg/dL (ref 0.20–1.20)
Total Protein: 7.4 g/dL (ref 6.4–8.3)

## 2015-12-12 MED ORDER — SODIUM CHLORIDE 0.9 % IV SOLN
20.0000 mg | Freq: Once | INTRAVENOUS | Status: AC
  Administered 2015-12-12: 20 mg via INTRAVENOUS
  Filled 2015-12-12: qty 2

## 2015-12-12 MED ORDER — DIPHENHYDRAMINE HCL 50 MG/ML IJ SOLN
INTRAMUSCULAR | Status: AC
  Filled 2015-12-12: qty 1

## 2015-12-12 MED ORDER — DIPHENHYDRAMINE HCL 50 MG/ML IJ SOLN
50.0000 mg | Freq: Once | INTRAMUSCULAR | Status: AC
  Administered 2015-12-12: 50 mg via INTRAVENOUS

## 2015-12-12 MED ORDER — FAMOTIDINE IN NACL 20-0.9 MG/50ML-% IV SOLN
20.0000 mg | Freq: Once | INTRAVENOUS | Status: AC
  Administered 2015-12-12: 20 mg via INTRAVENOUS

## 2015-12-12 MED ORDER — CARBOPLATIN CHEMO INJECTION 450 MG/45ML
160.0000 mg | Freq: Once | INTRAVENOUS | Status: AC
  Administered 2015-12-12: 160 mg via INTRAVENOUS
  Filled 2015-12-12: qty 16

## 2015-12-12 MED ORDER — SODIUM CHLORIDE 0.9 % IV SOLN
Freq: Once | INTRAVENOUS | Status: AC
  Administered 2015-12-12: 12:00:00 via INTRAVENOUS

## 2015-12-12 MED ORDER — FAMOTIDINE IN NACL 20-0.9 MG/50ML-% IV SOLN
INTRAVENOUS | Status: AC
  Filled 2015-12-12: qty 50

## 2015-12-12 MED ORDER — PALONOSETRON HCL INJECTION 0.25 MG/5ML
0.2500 mg | Freq: Once | INTRAVENOUS | Status: AC
  Administered 2015-12-12: 0.25 mg via INTRAVENOUS

## 2015-12-12 MED ORDER — SODIUM CHLORIDE 0.9 % IV SOLN
45.0000 mg/m2 | Freq: Once | INTRAVENOUS | Status: AC
  Administered 2015-12-12: 90 mg via INTRAVENOUS
  Filled 2015-12-12: qty 15

## 2015-12-12 MED ORDER — PALONOSETRON HCL INJECTION 0.25 MG/5ML
INTRAVENOUS | Status: AC
  Filled 2015-12-12: qty 5

## 2015-12-12 NOTE — Patient Instructions (Signed)
[facility] Discharge Instructions for Patients Receiving Chemotherapy  Today you received the following chemotherapy agents Taxol/ Carboplatin  To help prevent nausea and vomiting after your treatment, we encourage you to take your nausea medication compazine as directed by your MD.  If you develop nausea and vomiting that is not controlled by your nausea medication, call the clinic.   BELOW ARE SYMPTOMS THAT SHOULD BE REPORTED IMMEDIATELY:  *FEVER GREATER THAN 100.5 F  *CHILLS WITH OR WITHOUT FEVER  NAUSEA AND VOMITING THAT IS NOT CONTROLLED WITH YOUR NAUSEA MEDICATION  *UNUSUAL SHORTNESS OF BREATH  *UNUSUAL BRUISING OR BLEEDING  TENDERNESS IN MOUTH AND THROAT WITH OR WITHOUT PRESENCE OF ULCERS  *URINARY PROBLEMS  *BOWEL PROBLEMS  UNUSUAL RASH Items with * indicate a potential emergency and should be followed up as soon as possible.  Feel free to call the clinic you have any questions or concerns. The clinic phone number is [phone].  Please show the [facility] at check-in to the Emergency Department and triage nurse.   Paclitaxel injection TAXOL What is this medicine? PACLITAXEL (PAK li TAX el) is a chemotherapy drug. It targets fast dividing cells, like cancer cells, and causes these cells to die. This medicine is used to treat ovarian cancer, breast cancer, and other cancers. This medicine may be used for other purposes; ask your health care provider or pharmacist if you have questions. What should I tell my health care provider before I take this medicine? They need to know if you have any of these conditions: -blood disorders -irregular heartbeat -infection (especially a virus infection such as chickenpox, cold sores, or herpes) -liver disease -previous or ongoing radiation therapy -an unusual or allergic reaction to paclitaxel, alcohol, polyoxyethylated castor oil, other chemotherapy agents, other medicines, foods, dyes, or  preservatives -pregnant or trying to get pregnant -breast-feeding How should I use this medicine? This drug is given as an infusion into a vein. It is administered in a hospital or clinic by a specially trained health care professional. Talk to your pediatrician regarding the use of this medicine in children. Special care may be needed. Overdosage: If you think you have taken too much of this medicine contact a poison control center or emergency room at once. NOTE: This medicine is only for you. Do not share this medicine with others. What if I miss a dose? It is important not to miss your dose. Call your doctor or health care professional if you are unable to keep an appointment. What may interact with this medicine? Do not take this medicine with any of the following medications: -disulfiram -metronidazole This medicine may also interact with the following medications: -cyclosporine -diazepam -ketoconazole -medicines to increase blood counts like filgrastim, pegfilgrastim, sargramostim -other chemotherapy drugs like cisplatin, doxorubicin, epirubicin, etoposide, teniposide, vincristine -quinidine -testosterone -vaccines -verapamil Talk to your doctor or health care professional before taking any of these medicines: -acetaminophen -aspirin -ibuprofen -ketoprofen -naproxen This list may not describe all possible interactions. Give your health care provider a list of all the medicines, herbs, non-prescription drugs, or dietary supplements you use. Also tell them if you smoke, drink alcohol, or use illegal drugs. Some items may interact with your medicine. What should I watch for while using this medicine? Your condition will be monitored carefully while you are receiving this medicine. You will need important blood work done while you are taking this medicine. This drug may make you feel generally unwell. This is not uncommon, as chemotherapy can affect  healthy cells as well as cancer  cells. Report any side effects. Continue your course of treatment even though you feel ill unless your doctor tells you to stop. This medicine can cause serious allergic reactions. To reduce your risk you will need to take other medicine(s) before treatment with this medicine. In some cases, you may be given additional medicines to help with side effects. Follow all directions for their use. Call your doctor or health care professional for advice if you get a fever, chills or sore throat, or other symptoms of a cold or flu. Do not treat yourself. This drug decreases your body's ability to fight infections. Try to avoid being around people who are sick. This medicine may increase your risk to bruise or bleed. Call your doctor or health care professional if you notice any unusual bleeding. Be careful brushing and flossing your teeth or using a toothpick because you may get an infection or bleed more easily. If you have any dental work done, tell your dentist you are receiving this medicine. Avoid taking products that contain aspirin, acetaminophen, ibuprofen, naproxen, or ketoprofen unless instructed by your doctor. These medicines may hide a fever. Do not become pregnant while taking this medicine. Women should inform their doctor if they wish to become pregnant or think they might be pregnant. There is a potential for serious side effects to an unborn child. Talk to your health care professional or pharmacist for more information. Do not breast-feed an infant while taking this medicine. Men are advised not to father a child while receiving this medicine. This product may contain alcohol. Ask your pharmacist or healthcare provider if this medicine contains alcohol. Be sure to tell all healthcare providers you are taking this medicine. Certain medicines, like metronidazole and disulfiram, can cause an unpleasant reaction when taken with alcohol. The reaction includes flushing, headache, nausea, vomiting,  sweating, and increased thirst. The reaction can last from 30 minutes to several hours. What side effects may I notice from receiving this medicine? Side effects that you should report to your doctor or health care professional as soon as possible: -allergic reactions like skin rash, itching or hives, swelling of the face, lips, or tongue -low blood counts - This drug may decrease the number of white blood cells, red blood cells and platelets. You may be at increased risk for infections and bleeding. -signs of infection - fever or chills, cough, sore throat, pain or difficulty passing urine -signs of decreased platelets or bleeding - bruising, pinpoint red spots on the skin, black, tarry stools, nosebleeds -signs of decreased red blood cells - unusually weak or tired, fainting spells, lightheadedness -breathing problems -chest pain -high or low blood pressure -mouth sores -nausea and vomiting -pain, swelling, redness or irritation at the injection site -pain, tingling, numbness in the hands or feet -slow or irregular heartbeat -swelling of the ankle, feet, hands Side effects that usually do not require medical attention (report to your doctor or health care professional if they continue or are bothersome): -bone pain -complete hair loss including hair on your head, underarms, pubic hair, eyebrows, and eyelashes -changes in the color of fingernails -diarrhea -loosening of the fingernails -loss of appetite -muscle or joint pain -red flush to skin -sweating This list may not describe all possible side effects. Call your doctor for medical advice about side effects. You may report side effects to FDA at 1-800-FDA-1088. Where should I keep my medicine? This drug is given in a hospital or clinic and will  not be stored at home. NOTE: This sheet is a summary. It may not cover all possible information. If you have questions about this medicine, talk to your doctor, pharmacist, or health care  provider.    2016, Elsevier/Gold Standard. (2015-03-31 13:02:56)    Carboplatin injection What is this medicine? CARBOPLATIN (KAR boe pla tin) is a chemotherapy drug. It targets fast dividing cells, like cancer cells, and causes these cells to die. This medicine is used to treat ovarian cancer and many other cancers. This medicine may be used for other purposes; ask your health care provider or pharmacist if you have questions. What should I tell my health care provider before I take this medicine? They need to know if you have any of these conditions: -blood disorders -hearing problems -kidney disease -recent or ongoing radiation therapy -an unusual or allergic reaction to carboplatin, cisplatin, other chemotherapy, other medicines, foods, dyes, or preservatives -pregnant or trying to get pregnant -breast-feeding How should I use this medicine? This drug is usually given as an infusion into a vein. It is administered in a hospital or clinic by a specially trained health care professional. Talk to your pediatrician regarding the use of this medicine in children. Special care may be needed. Overdosage: If you think you have taken too much of this medicine contact a poison control center or emergency room at once. NOTE: This medicine is only for you. Do not share this medicine with others. What if I miss a dose? It is important not to miss a dose. Call your doctor or health care professional if you are unable to keep an appointment. What may interact with this medicine? -medicines for seizures -medicines to increase blood counts like filgrastim, pegfilgrastim, sargramostim -some antibiotics like amikacin, gentamicin, neomycin, streptomycin, tobramycin -vaccines Talk to your doctor or health care professional before taking any of these medicines: -acetaminophen -aspirin -ibuprofen -ketoprofen -naproxen This list may not describe all possible interactions. Give your health care  provider a list of all the medicines, herbs, non-prescription drugs, or dietary supplements you use. Also tell them if you smoke, drink alcohol, or use illegal drugs. Some items may interact with your medicine. What should I watch for while using this medicine? Your condition will be monitored carefully while you are receiving this medicine. You will need important blood work done while you are taking this medicine. This drug may make you feel generally unwell. This is not uncommon, as chemotherapy can affect healthy cells as well as cancer cells. Report any side effects. Continue your course of treatment even though you feel ill unless your doctor tells you to stop. In some cases, you may be given additional medicines to help with side effects. Follow all directions for their use. Call your doctor or health care professional for advice if you get a fever, chills or sore throat, or other symptoms of a cold or flu. Do not treat yourself. This drug decreases your body's ability to fight infections. Try to avoid being around people who are sick. This medicine may increase your risk to bruise or bleed. Call your doctor or health care professional if you notice any unusual bleeding. Be careful brushing and flossing your teeth or using a toothpick because you may get an infection or bleed more easily. If you have any dental work done, tell your dentist you are receiving this medicine. Avoid taking products that contain aspirin, acetaminophen, ibuprofen, naproxen, or ketoprofen unless instructed by your doctor. These medicines may hide a fever. Do not  become pregnant while taking this medicine. Women should inform their doctor if they wish to become pregnant or think they might be pregnant. There is a potential for serious side effects to an unborn child. Talk to your health care professional or pharmacist for more information. Do not breast-feed an infant while taking this medicine. What side effects may I  notice from receiving this medicine? Side effects that you should report to your doctor or health care professional as soon as possible: -allergic reactions like skin rash, itching or hives, swelling of the face, lips, or tongue -signs of infection - fever or chills, cough, sore throat, pain or difficulty passing urine -signs of decreased platelets or bleeding - bruising, pinpoint red spots on the skin, black, tarry stools, nosebleeds -signs of decreased red blood cells - unusually weak or tired, fainting spells, lightheadedness -breathing problems -changes in hearing -changes in vision -chest pain -high blood pressure -low blood counts - This drug may decrease the number of white blood cells, red blood cells and platelets. You may be at increased risk for infections and bleeding. -nausea and vomiting -pain, swelling, redness or irritation at the injection site -pain, tingling, numbness in the hands or feet -problems with balance, talking, walking -trouble passing urine or change in the amount of urine Side effects that usually do not require medical attention (report to your doctor or health care professional if they continue or are bothersome): -hair loss -loss of appetite -metallic taste in the mouth or changes in taste This list may not describe all possible side effects. Call your doctor for medical advice about side effects. You may report side effects to FDA at 1-800-FDA-1088. Where should I keep my medicine? This drug is given in a hospital or clinic and will not be stored at home. NOTE: This sheet is a summary. It may not cover all possible information. If you have questions about this medicine, talk to your doctor, pharmacist, or health care provider.    2016, Elsevier/Gold Standard. (2007-11-18 14:38:05)

## 2015-12-12 NOTE — Progress Notes (Signed)
Introduced myself as his FA.  Informed him that unfortunately at this time there aren't any foundations offering copay assistance for his Dx but gave him my card for any questions or concerns he may have in the future.

## 2015-12-12 NOTE — Progress Notes (Signed)
Pt first time chemo (Taxol/ Caboplatin) today. Tolerated both without any issues. Pt signed consent form and is aware of risk and benefits. Pt teaching done on possible side effects of taxol and carboplatin. Pt understands when to take nausea medication. Pt verbalized understanding on when to contact MD when he has a concern. AVS printed with information about taxol/carbo and future appts. In basket placed for follow up phone call tomorrow.

## 2015-12-13 ENCOUNTER — Ambulatory Visit
Admission: RE | Admit: 2015-12-13 | Discharge: 2015-12-13 | Disposition: A | Discharge status: Home / Self Care | Payer: Medicare Other | Source: Ambulatory Visit | Attending: Radiation Oncology | Admitting: Radiation Oncology

## 2015-12-13 ENCOUNTER — Telehealth: Payer: Self-pay

## 2015-12-13 DIAGNOSIS — Z510 Encounter for antineoplastic radiation therapy: Secondary | ICD-10-CM | POA: Diagnosis not present

## 2015-12-13 DIAGNOSIS — C3411 Malignant neoplasm of upper lobe, right bronchus or lung: Secondary | ICD-10-CM | POA: Diagnosis not present

## 2015-12-13 NOTE — Telephone Encounter (Signed)
-----   Message from [place], RN sent at 12/12/2015  1:44 PM EDT ----- Regarding: Dr. Julien Nordmann. First time chemo follow up call Dr. Julien Nordmann. First time Taxol/ Carboplatin. Needs follow up phone call.

## 2015-12-13 NOTE — Telephone Encounter (Signed)
Left message for patient telling him that if he had anything to report regarding his first time chemo yesterday to give [place] a call

## 2015-12-14 ENCOUNTER — Ambulatory Visit
Admission: RE | Admit: 2015-12-14 | Discharge: 2015-12-14 | Disposition: A | Discharge status: Home / Self Care | Payer: Medicare Other | Source: Ambulatory Visit | Attending: Radiation Oncology | Admitting: Radiation Oncology

## 2015-12-14 DIAGNOSIS — I255 Ischemic cardiomyopathy: Secondary | ICD-10-CM

## 2015-12-14 DIAGNOSIS — E871 Hypo-osmolality and hyponatremia: Secondary | ICD-10-CM

## 2015-12-14 DIAGNOSIS — I2510 Atherosclerotic heart disease of native coronary artery without angina pectoris: Secondary | ICD-10-CM

## 2015-12-14 DIAGNOSIS — R0989 Other specified symptoms and signs involving the circulatory and respiratory systems: Secondary | ICD-10-CM

## 2015-12-14 DIAGNOSIS — I679 Cerebrovascular disease, unspecified: Secondary | ICD-10-CM

## 2015-12-14 DIAGNOSIS — I7771 Dissection of carotid artery: Secondary | ICD-10-CM | POA: Insufficient documentation

## 2015-12-14 DIAGNOSIS — R042 Hemoptysis: Secondary | ICD-10-CM | POA: Insufficient documentation

## 2015-12-14 DIAGNOSIS — N183 Chronic kidney disease, stage 3 unspecified: Secondary | ICD-10-CM

## 2015-12-14 DIAGNOSIS — C3411 Malignant neoplasm of upper lobe, right bronchus or lung: Secondary | ICD-10-CM | POA: Diagnosis not present

## 2015-12-14 DIAGNOSIS — I10 Essential (primary) hypertension: Secondary | ICD-10-CM | POA: Insufficient documentation

## 2015-12-14 DIAGNOSIS — E785 Hyperlipidemia, unspecified: Secondary | ICD-10-CM | POA: Insufficient documentation

## 2015-12-14 DIAGNOSIS — R918 Other nonspecific abnormal finding of lung field: Secondary | ICD-10-CM

## 2015-12-14 DIAGNOSIS — D72829 Elevated white blood cell count, unspecified: Secondary | ICD-10-CM | POA: Insufficient documentation

## 2015-12-14 DIAGNOSIS — D696 Thrombocytopenia, unspecified: Secondary | ICD-10-CM

## 2015-12-14 DIAGNOSIS — Z510 Encounter for antineoplastic radiation therapy: Secondary | ICD-10-CM | POA: Diagnosis not present

## 2015-12-14 DIAGNOSIS — D62 Acute posthemorrhagic anemia: Secondary | ICD-10-CM

## 2015-12-14 MED ORDER — RADIAPLEXRX EX GEL
Freq: Once | CUTANEOUS | Status: AC
  Administered 2015-12-14: 16:00:00 via TOPICAL

## 2015-12-14 NOTE — Progress Notes (Signed)
Oriented patient to staff and routine of the clinic. Provided patient with RADIATION THERAPY AND YOU handbook then, reviewed pertinent information. Educated patient reference potential side effects and management such as fatigue, skin changes, cough, and throat changes. Provided patient with RADIAPLEX gel and directed upon use. Provided patient with my business card and encouraged him to call with needs. Answered all patient questions to the best of my ability. Patient verbalized understanding of all reviewed.

## 2015-12-15 ENCOUNTER — Ambulatory Visit
Admission: RE | Admit: 2015-12-15 | Discharge: 2015-12-15 | Disposition: A | Discharge status: Home / Self Care | Payer: Medicare Other | Source: Ambulatory Visit | Attending: Radiation Oncology | Admitting: Radiation Oncology

## 2015-12-15 DIAGNOSIS — C3411 Malignant neoplasm of upper lobe, right bronchus or lung: Secondary | ICD-10-CM | POA: Diagnosis not present

## 2015-12-15 DIAGNOSIS — Z510 Encounter for antineoplastic radiation therapy: Secondary | ICD-10-CM | POA: Diagnosis not present

## 2015-12-16 ENCOUNTER — Encounter: Payer: Self-pay | Admitting: Radiation Oncology

## 2015-12-16 ENCOUNTER — Other Ambulatory Visit: Payer: Self-pay | Admitting: *Deleted

## 2015-12-16 ENCOUNTER — Ambulatory Visit
Admission: RE | Admit: 2015-12-16 | Discharge: 2015-12-16 | Disposition: A | Discharge status: Home / Self Care | Payer: Medicare Other | Source: Ambulatory Visit | Attending: Radiation Oncology | Admitting: Radiation Oncology

## 2015-12-16 VITALS — BP 117/54 | HR 73 | Resp 16 | Wt 187.4 lb

## 2015-12-16 DIAGNOSIS — Z510 Encounter for antineoplastic radiation therapy: Secondary | ICD-10-CM | POA: Diagnosis not present

## 2015-12-16 DIAGNOSIS — C3411 Malignant neoplasm of upper lobe, right bronchus or lung: Secondary | ICD-10-CM

## 2015-12-16 NOTE — Progress Notes (Signed)
Weight and vitals stable. Denies pain. Reports an occasional productive cough with clear sputum. Denies shortness of breath. Denies pain or difficulty associated with swallowing. Denies skin changes within treatment field. Reports using radiaplex bid as directed. Concerned about nocturia x 6-7 but, does report increased fluid intake.   BP 117/54 mmHg  Pulse 73  Resp 16  Wt 187 lb 6.4 oz (85.004 kg)  SpO2 100% Wt Readings from Last 3 Encounters:  12/16/15 187 lb 6.4 oz (85.004 kg)  12/02/15 184 lb 8 oz (83.689 kg)  12/01/15 185 lb 3.2 oz (84.006 kg)

## 2015-12-16 NOTE — Progress Notes (Signed)
  Radiation Oncology         [phone]   [name] MRN: [id number]   Date: 12/16/2015  DOB: [birth year]     Weekly Radiation Therapy Management    ICD-9-CM ICD-10-CM   1. Primary cancer of right upper lobe of lung (HCC) 162.3 C34.11     Current Dose: 10 Gy  Planned Dose:  66 Gy  Narrative The patient presents for routine under treatment assessment.  Weight and vitals stable. Denies pain. Reports an occasional productive cough with clear sputum. Denies shortness of breath. Denies pain or difficulty associated with swallowing. Denies skin changes within treatment field. Reports using radiaplex bid as directed. Concerned about nocturia x 5-6 but, does report increased fluid intake. He receives chemotherapy on Mondays.    The patient is without complaint. Set-up films were reviewed. The chart was checked.  Physical Findings  weight is 187 lb 6.4 oz (85.004 kg). His blood pressure is 117/54 and his pulse is 73. His respiration is 16 and oxygen saturation is 100%. . Weight essentially stable.  No significant changes.  Impression The patient is tolerating radiation.  Plan Continue treatment as planned.         Sheral Apley Tammi Klippel, M.D.  This document serves as a record of services personally performed by Tyler Pita, MD. It was created on his behalf by Arlyce Harman, a trained medical scribe. The creation of this record is based on the scribe's personal observations and the provider's statements to them. This document has been checked and approved by the attending provider.

## 2015-12-19 ENCOUNTER — Telehealth: Payer: Self-pay | Admitting: Internal Medicine

## 2015-12-19 ENCOUNTER — Other Ambulatory Visit (HOSPITAL_BASED_OUTPATIENT_CLINIC_OR_DEPARTMENT_OTHER): Payer: Medicare Other

## 2015-12-19 ENCOUNTER — Encounter: Payer: Self-pay | Admitting: *Deleted

## 2015-12-19 ENCOUNTER — Ambulatory Visit (HOSPITAL_BASED_OUTPATIENT_CLINIC_OR_DEPARTMENT_OTHER): Payer: Medicare Other | Admitting: Internal Medicine

## 2015-12-19 ENCOUNTER — Ambulatory Visit (HOSPITAL_BASED_OUTPATIENT_CLINIC_OR_DEPARTMENT_OTHER): Payer: Medicare Other

## 2015-12-19 ENCOUNTER — Telehealth: Payer: Self-pay | Admitting: *Deleted

## 2015-12-19 ENCOUNTER — Encounter: Payer: Self-pay | Admitting: Internal Medicine

## 2015-12-19 ENCOUNTER — Ambulatory Visit
Admission: RE | Admit: 2015-12-19 | Discharge: 2015-12-19 | Disposition: A | Discharge status: Home / Self Care | Payer: Medicare Other | Source: Ambulatory Visit | Attending: Radiation Oncology | Admitting: Radiation Oncology

## 2015-12-19 VITALS — BP 117/51 | HR 80 | Temp 98.1°F | Resp 18 | Ht 70.0 in | Wt 184.9 lb

## 2015-12-19 DIAGNOSIS — Z5111 Encounter for antineoplastic chemotherapy: Secondary | ICD-10-CM | POA: Diagnosis not present

## 2015-12-19 DIAGNOSIS — Z7189 Other specified counseling: Secondary | ICD-10-CM | POA: Insufficient documentation

## 2015-12-19 DIAGNOSIS — C3411 Malignant neoplasm of upper lobe, right bronchus or lung: Secondary | ICD-10-CM

## 2015-12-19 DIAGNOSIS — Z510 Encounter for antineoplastic radiation therapy: Secondary | ICD-10-CM | POA: Diagnosis not present

## 2015-12-19 HISTORY — DX: Encounter for antineoplastic chemotherapy: Z51.11

## 2015-12-19 LAB — COMPREHENSIVE METABOLIC PANEL
ALT: 16 U/L (ref 0–55)
ANION GAP: 9 meq/L (ref 3–11)
AST: 19 U/L (ref 5–34)
Albumin: 3 g/dL — ABNORMAL LOW (ref 3.5–5.0)
Alkaline Phosphatase: 77 U/L (ref 40–150)
BUN: 19.9 mg/dL (ref 7.0–26.0)
CALCIUM: 9.7 mg/dL (ref 8.4–10.4)
CHLORIDE: 98 meq/L (ref 98–109)
CO2: 28 meq/L (ref 22–29)
Creatinine: 1.3 mg/dL (ref 0.7–1.3)
EGFR: 53 mL/min/{1.73_m2} — ABNORMAL LOW (ref >90)
Glucose: 83 mg/dl (ref 70–140)
POTASSIUM: 4.6 meq/L (ref 3.5–5.1)
Sodium: 135 mEq/L — ABNORMAL LOW (ref 136–145)
Total Bilirubin: 0.45 mg/dL (ref 0.20–1.20)
Total Protein: 7.5 g/dL (ref 6.4–8.3)

## 2015-12-19 LAB — CBC WITH DIFFERENTIAL/PLATELET
BASO%: 0.3 % (ref 0.0–2.0)
BASOS ABS: 0 10*3/uL (ref 0.0–0.1)
EOS%: 0.6 % (ref 0.0–7.0)
Eosinophils Absolute: 0 10*3/uL (ref 0.0–0.5)
HEMATOCRIT: 31.8 % — AB (ref 38.4–49.9)
HGB: 10.2 g/dL — ABNORMAL LOW (ref 13.0–17.1)
LYMPH#: 0.7 10*3/uL — AB (ref 0.9–3.3)
LYMPH%: 9.8 % — AB (ref 14.0–49.0)
MCH: 27.6 pg (ref 27.2–33.4)
MCHC: 32.1 g/dL (ref 32.0–36.0)
MCV: 86.1 fL (ref 79.3–98.0)
MONO#: 0.4 10*3/uL (ref 0.1–0.9)
MONO%: 5.8 % (ref 0.0–14.0)
NEUT#: 5.6 10*3/uL (ref 1.5–6.5)
NEUT%: 83.5 % — AB (ref 39.0–75.0)
Platelets: 190 10*3/uL (ref 140–400)
RBC: 3.69 10*6/uL — AB (ref 4.20–5.82)
RDW: 15.4 % — ABNORMAL HIGH (ref 11.0–14.6)
WBC: 6.7 10*3/uL (ref 4.0–10.3)

## 2015-12-19 MED ORDER — SODIUM CHLORIDE 0.9 % IV SOLN
45.0000 mg/m2 | Freq: Once | INTRAVENOUS | Status: AC
  Administered 2015-12-19: 90 mg via INTRAVENOUS
  Filled 2015-12-19: qty 15

## 2015-12-19 MED ORDER — PALONOSETRON HCL INJECTION 0.25 MG/5ML
INTRAVENOUS | Status: AC
  Filled 2015-12-19: qty 5

## 2015-12-19 MED ORDER — DIPHENHYDRAMINE HCL 50 MG/ML IJ SOLN
50.0000 mg | Freq: Once | INTRAMUSCULAR | Status: AC
  Administered 2015-12-19: 50 mg via INTRAVENOUS

## 2015-12-19 MED ORDER — DEXAMETHASONE SODIUM PHOSPHATE 100 MG/10ML IJ SOLN
20.0000 mg | Freq: Once | INTRAMUSCULAR | Status: AC
  Administered 2015-12-19: 20 mg via INTRAVENOUS
  Filled 2015-12-19: qty 2

## 2015-12-19 MED ORDER — SODIUM CHLORIDE 0.9 % IV SOLN
Freq: Once | INTRAVENOUS | Status: AC
  Administered 2015-12-19: 11:00:00 via INTRAVENOUS

## 2015-12-19 MED ORDER — SODIUM CHLORIDE 0.9 % IV SOLN
163.2000 mg | Freq: Once | INTRAVENOUS | Status: AC
  Administered 2015-12-19: 160 mg via INTRAVENOUS
  Filled 2015-12-19: qty 16

## 2015-12-19 MED ORDER — FAMOTIDINE IN NACL 20-0.9 MG/50ML-% IV SOLN
INTRAVENOUS | Status: AC
Start: 2015-12-19 — End: 2015-12-19
  Filled 2015-12-19: qty 50

## 2015-12-19 MED ORDER — PALONOSETRON HCL INJECTION 0.25 MG/5ML
0.2500 mg | Freq: Once | INTRAVENOUS | Status: AC
  Administered 2015-12-19: 0.25 mg via INTRAVENOUS

## 2015-12-19 MED ORDER — FAMOTIDINE IN NACL 20-0.9 MG/50ML-% IV SOLN
20.0000 mg | Freq: Once | INTRAVENOUS | Status: AC
  Administered 2015-12-19: 20 mg via INTRAVENOUS

## 2015-12-19 MED ORDER — DIPHENHYDRAMINE HCL 50 MG/ML IJ SOLN
INTRAMUSCULAR | Status: AC
  Filled 2015-12-19: qty 1

## 2015-12-19 NOTE — Progress Notes (Signed)
Oncology Nurse Navigator Documentation  Oncology Nurse Navigator Flowsheets 12/19/2015  Navigator Location [facility]  Navigator Encounter Type Clinic/MDC  Treatment Phase Treatment  Barriers/Navigation Needs Education  Education Other  Interventions Education Method  Education Method Verbal  Acuity Level 2  Acuity Level 2 Educational needs  Time Spent with Patient 15   Spoke with patient today at [facility].  He is doing well.  He has some c/o elevated heart and lower BP than usual.  Dr. Julien Nordmann asked that he increased his fluid intake.  I then explained dehydration and the need to increase his fluids.  He stated he would keep an eye on his BP and heart rate.  I asked that he calls if needed.

## 2015-12-19 NOTE — Patient Instructions (Signed)
[facility] Discharge Instructions for Patients Receiving Chemotherapy  Today you received the following chemotherapy agents Taxol/ Carboplatin  To help prevent nausea and vomiting after your treatment, we encourage you to take your nausea medication compazine as directed by your MD.  If you develop nausea and vomiting that is not controlled by your nausea medication, call the clinic.   BELOW ARE SYMPTOMS THAT SHOULD BE REPORTED IMMEDIATELY:  *FEVER GREATER THAN 100.5 F  *CHILLS WITH OR WITHOUT FEVER  NAUSEA AND VOMITING THAT IS NOT CONTROLLED WITH YOUR NAUSEA MEDICATION  *UNUSUAL SHORTNESS OF BREATH  *UNUSUAL BRUISING OR BLEEDING  TENDERNESS IN MOUTH AND THROAT WITH OR WITHOUT PRESENCE OF ULCERS  *URINARY PROBLEMS  *BOWEL PROBLEMS  UNUSUAL RASH Items with * indicate a potential emergency and should be followed up as soon as possible.  Feel free to call the clinic you have any questions or concerns. The clinic phone number is [phone].  Please show the [facility] at check-in to the Emergency Department and triage nurse.   Paclitaxel injection TAXOL What is this medicine? PACLITAXEL (PAK li TAX el) is a chemotherapy drug. It targets fast dividing cells, like cancer cells, and causes these cells to die. This medicine is used to treat ovarian cancer, breast cancer, and other cancers. This medicine may be used for other purposes; ask your health care provider or pharmacist if you have questions. What should I tell my health care provider before I take this medicine? They need to know if you have any of these conditions: -blood disorders -irregular heartbeat -infection (especially a virus infection such as chickenpox, cold sores, or herpes) -liver disease -previous or ongoing radiation therapy -an unusual or allergic reaction to paclitaxel, alcohol, polyoxyethylated castor oil, other chemotherapy agents, other medicines, foods, dyes, or  preservatives -pregnant or trying to get pregnant -breast-feeding How should I use this medicine? This drug is given as an infusion into a vein. It is administered in a hospital or clinic by a specially trained health care professional. Talk to your pediatrician regarding the use of this medicine in children. Special care may be needed. Overdosage: If you think you have taken too much of this medicine contact a poison control center or emergency room at once. NOTE: This medicine is only for you. Do not share this medicine with others. What if I miss a dose? It is important not to miss your dose. Call your doctor or health care professional if you are unable to keep an appointment. What may interact with this medicine? Do not take this medicine with any of the following medications: -disulfiram -metronidazole This medicine may also interact with the following medications: -cyclosporine -diazepam -ketoconazole -medicines to increase blood counts like filgrastim, pegfilgrastim, sargramostim -other chemotherapy drugs like cisplatin, doxorubicin, epirubicin, etoposide, teniposide, vincristine -quinidine -testosterone -vaccines -verapamil Talk to your doctor or health care professional before taking any of these medicines: -acetaminophen -aspirin -ibuprofen -ketoprofen -naproxen This list may not describe all possible interactions. Give your health care provider a list of all the medicines, herbs, non-prescription drugs, or dietary supplements you use. Also tell them if you smoke, drink alcohol, or use illegal drugs. Some items may interact with your medicine. What should I watch for while using this medicine? Your condition will be monitored carefully while you are receiving this medicine. You will need important blood work done while you are taking this medicine. This drug may make you feel generally unwell. This is not uncommon, as chemotherapy can affect  healthy cells as well as cancer  cells. Report any side effects. Continue your course of treatment even though you feel ill unless your doctor tells you to stop. This medicine can cause serious allergic reactions. To reduce your risk you will need to take other medicine(s) before treatment with this medicine. In some cases, you may be given additional medicines to help with side effects. Follow all directions for their use. Call your doctor or health care professional for advice if you get a fever, chills or sore throat, or other symptoms of a cold or flu. Do not treat yourself. This drug decreases your body's ability to fight infections. Try to avoid being around people who are sick. This medicine may increase your risk to bruise or bleed. Call your doctor or health care professional if you notice any unusual bleeding. Be careful brushing and flossing your teeth or using a toothpick because you may get an infection or bleed more easily. If you have any dental work done, tell your dentist you are receiving this medicine. Avoid taking products that contain aspirin, acetaminophen, ibuprofen, naproxen, or ketoprofen unless instructed by your doctor. These medicines may hide a fever. Do not become pregnant while taking this medicine. Women should inform their doctor if they wish to become pregnant or think they might be pregnant. There is a potential for serious side effects to an unborn child. Talk to your health care professional or pharmacist for more information. Do not breast-feed an infant while taking this medicine. Men are advised not to father a child while receiving this medicine. This product may contain alcohol. Ask your pharmacist or healthcare provider if this medicine contains alcohol. Be sure to tell all healthcare providers you are taking this medicine. Certain medicines, like metronidazole and disulfiram, can cause an unpleasant reaction when taken with alcohol. The reaction includes flushing, headache, nausea, vomiting,  sweating, and increased thirst. The reaction can last from 30 minutes to several hours. What side effects may I notice from receiving this medicine? Side effects that you should report to your doctor or health care professional as soon as possible: -allergic reactions like skin rash, itching or hives, swelling of the face, lips, or tongue -low blood counts - This drug may decrease the number of white blood cells, red blood cells and platelets. You may be at increased risk for infections and bleeding. -signs of infection - fever or chills, cough, sore throat, pain or difficulty passing urine -signs of decreased platelets or bleeding - bruising, pinpoint red spots on the skin, black, tarry stools, nosebleeds -signs of decreased red blood cells - unusually weak or tired, fainting spells, lightheadedness -breathing problems -chest pain -high or low blood pressure -mouth sores -nausea and vomiting -pain, swelling, redness or irritation at the injection site -pain, tingling, numbness in the hands or feet -slow or irregular heartbeat -swelling of the ankle, feet, hands Side effects that usually do not require medical attention (report to your doctor or health care professional if they continue or are bothersome): -bone pain -complete hair loss including hair on your head, underarms, pubic hair, eyebrows, and eyelashes -changes in the color of fingernails -diarrhea -loosening of the fingernails -loss of appetite -muscle or joint pain -red flush to skin -sweating This list may not describe all possible side effects. Call your doctor for medical advice about side effects. You may report side effects to FDA at 1-800-FDA-1088. Where should I keep my medicine? This drug is given in a hospital or clinic and will  not be stored at home. NOTE: This sheet is a summary. It may not cover all possible information. If you have questions about this medicine, talk to your doctor, pharmacist, or health care  provider.    2016, [employer]. (2015-03-31 13:02:56)    Carboplatin injection What is this medicine? CARBOPLATIN (KAR boe pla tin) is a chemotherapy drug. It targets fast dividing cells, like cancer cells, and causes these cells to die. This medicine is used to treat ovarian cancer and many other cancers. This medicine may be used for other purposes; ask your health care provider or pharmacist if you have questions. What should I tell my health care provider before I take this medicine? They need to know if you have any of these conditions: -blood disorders -hearing problems -kidney disease -recent or ongoing radiation therapy -an unusual or allergic reaction to carboplatin, cisplatin, other chemotherapy, other medicines, foods, dyes, or preservatives -pregnant or trying to get pregnant -breast-feeding How should I use this medicine? This drug is usually given as an infusion into a vein. It is administered in a hospital or clinic by a specially trained health care professional. Talk to your pediatrician regarding the use of this medicine in children. Special care may be needed. Overdosage: If you think you have taken too much of this medicine contact a poison control center or emergency room at once. NOTE: This medicine is only for you. Do not share this medicine with others. What if I miss a dose? It is important not to miss a dose. Call your doctor or health care professional if you are unable to keep an appointment. What may interact with this medicine? -medicines for seizures -medicines to increase blood counts like filgrastim, pegfilgrastim, sargramostim -some antibiotics like amikacin, gentamicin, neomycin, streptomycin, tobramycin -vaccines Talk to your doctor or health care professional before taking any of these medicines: -acetaminophen -aspirin -ibuprofen -ketoprofen -naproxen This list may not describe all possible interactions. Give your health care  provider a list of all the medicines, herbs, non-prescription drugs, or dietary supplements you use. Also tell them if you smoke, drink alcohol, or use illegal drugs. Some items may interact with your medicine. What should I watch for while using this medicine? Your condition will be monitored carefully while you are receiving this medicine. You will need important blood work done while you are taking this medicine. This drug may make you feel generally unwell. This is not uncommon, as chemotherapy can affect healthy cells as well as cancer cells. Report any side effects. Continue your course of treatment even though you feel ill unless your doctor tells you to stop. In some cases, you may be given additional medicines to help with side effects. Follow all directions for their use. Call your doctor or health care professional for advice if you get a fever, chills or sore throat, or other symptoms of a cold or flu. Do not treat yourself. This drug decreases your body's ability to fight infections. Try to avoid being around people who are sick. This medicine may increase your risk to bruise or bleed. Call your doctor or health care professional if you notice any unusual bleeding. Be careful brushing and flossing your teeth or using a toothpick because you may get an infection or bleed more easily. If you have any dental work done, tell your dentist you are receiving this medicine. Avoid taking products that contain aspirin, acetaminophen, ibuprofen, naproxen, or ketoprofen unless instructed by your doctor. These medicines may hide a fever. Do not  become pregnant while taking this medicine. Women should inform their doctor if they wish to become pregnant or think they might be pregnant. There is a potential for serious side effects to an unborn child. Talk to your health care professional or pharmacist for more information. Do not breast-feed an infant while taking this medicine. What side effects may I  notice from receiving this medicine? Side effects that you should report to your doctor or health care professional as soon as possible: -allergic reactions like skin rash, itching or hives, swelling of the face, lips, or tongue -signs of infection - fever or chills, cough, sore throat, pain or difficulty passing urine -signs of decreased platelets or bleeding - bruising, pinpoint red spots on the skin, black, tarry stools, nosebleeds -signs of decreased red blood cells - unusually weak or tired, fainting spells, lightheadedness -breathing problems -changes in hearing -changes in vision -chest pain -high blood pressure -low blood counts - This drug may decrease the number of white blood cells, red blood cells and platelets. You may be at increased risk for infections and bleeding. -nausea and vomiting -pain, swelling, redness or irritation at the injection site -pain, tingling, numbness in the hands or feet -problems with balance, talking, walking -trouble passing urine or change in the amount of urine Side effects that usually do not require medical attention (report to your doctor or health care professional if they continue or are bothersome): -hair loss -loss of appetite -metallic taste in the mouth or changes in taste This list may not describe all possible side effects. Call your doctor for medical advice about side effects. You may report side effects to FDA at 1-800-FDA-1088. Where should I keep my medicine? This drug is given in a hospital or clinic and will not be stored at home. NOTE: This sheet is a summary. It may not cover all possible information. If you have questions about this medicine, talk to your doctor, pharmacist, or health care provider.    2016, [employer]. (2007-11-18 14:38:05)

## 2015-12-19 NOTE — Telephone Encounter (Signed)
Gave and printed appt sched and avs for pt for April May and JUNE

## 2015-12-19 NOTE — Progress Notes (Signed)
[facility] [phone]   [phone]  OFFICE PROGRESS NOTE  [name], MD [place] [address]  DIAGNOSIS: Stage IIIA (T2b, N2, M0) non-small cell lung cancer, favoring squamous cell carcinoma presented with right upper lobe lung mass in addition to mediastinal lymphadenopathy diagnosed in March 2017.  PRIOR THERAPY: None.  CURRENT THERAPY: A course of concurrent chemoradiation with weekly carboplatin for AUC of 2 and paclitaxel 45 MG/M2. Status post one cycle.  INTERVAL HISTORY: Paul Sandoval 75 y.o. male returns to the clinic today for follow-up visit. The patient is started a course of concurrent chemoradiation last week and tolerated the first cycle of his treatment fairly well with no significant adverse effects. He denied having any significant nausea or vomiting. He has no fever or chills. The patient denied having any significant chest pain, shortness of breath, cough or hemoptysis. He has some arthritic pain on the left side of the neck and he will try to schedule an appointment with orthopedic surgery for evaluation. He is here today for evaluation before starting cycle #2.  MEDICAL HISTORY: Past Medical History  Diagnosis Date  . 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 AAA (abdominal aortic aneurysm) ([place]) 2010    4.4 cm 08/2008;4.44 in 7/10 and 4.65 in 08/2009; 4.8 by CT in 11/2009; 4.3 by ultrasound in 08/2010  . Hypertension   . Hyperlipidemia   . Tobacco abuse, in remission     40 pack year total consumption; discontinued in 1996  . GERD (gastroesophageal reflux disease)   . Right bundle branch block   . Diverticulosis   . Colonic polyp 2002    polypectomy in 2002  . COPD (chronic obstructive pulmonary disease) ([place])   . CAD (coronary artery disease)     03/18/14:  PCI with DES to distal left main. 7/29: DES to the SVG to Diag  . ED (erectile dysfunction)   . IFG  (impaired fasting glucose)   . Chronic rhinitis   . Myocardial infarction [facility])     "told h/o silent MI sometime before 1996"  . Pneumonia ~ 2001; ~ 2005  . Chronic bronchitis ([place])   . Arthritis     "fingers" (03/18/2014)  . Cardiomyopathy, ischemic     Echo 03/17/14: EF 45-50%    ALLERGIES:  is allergic to neomycin.  MEDICATIONS:  Current Outpatient Prescriptions  Medication Sig Dispense Refill  . acetaminophen (TYLENOL) 500 MG tablet Take 500 mg by mouth every 6 (six) hours as needed for mild pain.    Marland Kitchen Clopidogrel Bisulfate (PLAVIX PO) Take 75 mg by mouth daily. Reported on 12/02/2015    . enalapril (VASOTEC) 10 MG tablet Take one and a half tablets PO BID 90 tablet 5  . Ferrous Sulfate (IRON) 28 MG TABS Take 28 mg by mouth daily.    . hydrochlorothiazide (HYDRODIURIL) 25 MG tablet Take 0.5 tablets (12.5 mg total) by mouth daily.    . metoprolol (LOPRESSOR) 50 MG tablet Take 50 mg by mouth 2 (two) times daily.     . Multiple Vitamins-Minerals (CENTRUM SILVER ADULT 50+) TABS Take 1 tablet by mouth daily.     Marland Kitchen omeprazole (PRILOSEC) 20 MG capsule Take 1 capsule (20 mg total) by mouth daily. 90 capsule 3  . simvastatin (ZOCOR) 80 MG tablet Take 0.5 tablets (40 mg total) by mouth at bedtime. 15 tablet 5  . VENTOLIN HFA 108 (90 Base) MCG/ACT inhaler  INHALE 2 PUFFS BY MOUTH EVERY 4 TO 6 HOURS AS NEEDED FOR WHEEZING. 18 g 5  . Wound Cleansers (RADIAPLEX EX) Apply topically.    . nitroGLYCERIN (NITROSTAT) 0.4 MG SL tablet Place 1 tablet (0.4 mg total) under the tongue every 5 (five) minutes as needed for chest pain. (Patient not taking: Reported on 12/19/2015) 25 tablet 4  . prochlorperazine (COMPAZINE) 10 MG tablet Take 1 tablet (10 mg total) by mouth every 6 (six) hours as needed for nausea or vomiting. (Patient not taking: Reported on 12/02/2015) 30 tablet 0   No current facility-administered medications for this visit.    SURGICAL HISTORY:  Past Surgical History  Procedure Laterality  Date  . Colonoscopy  2002    polypectomy-patient denies  . Laparoscopic cholecystectomy  12/2009  . Abdominal aortic endovascular stent graft N/A 12/11/2012    Procedure: ABDOMINAL AORTIC ENDOVASCULAR STENT GRAFT;  Surgeon: Mal Misty, MD;  Location: [facility];  Service: Vascular;  Laterality: N/A;  Ultrasound guided; [place]  . Abdominal aortic aneurysm repair  11/2012  . Total hip arthroplasty Left 01/21/2013    Procedure: TOTAL HIP ARTHROPLASTY ANTERIOR APPROACH;  Surgeon: Mauri Pole, MD;  Location: [facility];  Service: Orthopedics;  Laterality: Left;  . Joint replacement    . Coronary artery bypass graft  01/09/1995    "CABG X3"  . Cardiac catheterization  01/08/1995  . Coronary angioplasty with stent placement  03/18/2014    "1"  . Coronary angioplasty with stent placement  03/24/2014    "1"  . Left and right heart catheterization with coronary/graft angiogram N/A 03/18/2014    Procedure: LEFT AND RIGHT HEART CATHETERIZATION WITH Beatrix Fetters;  Surgeon: Blane Ohara, MD;  Location: [facility] CATH LAB;  Service: Cardiovascular;  Laterality: N/A;  . Percutaneous coronary stent intervention (pci-s)  03/18/2014    Procedure: PERCUTANEOUS CORONARY STENT INTERVENTION (PCI-S);  Surgeon: Blane Ohara, MD;  Location: [facility] CATH LAB;  Service: Cardiovascular;;  . Percutaneous coronary stent intervention (pci-s) N/A 03/24/2014    Procedure: PERCUTANEOUS CORONARY STENT INTERVENTION (PCI-S);  Surgeon: Blane Ohara, MD;  Location: [facility] CATH LAB;  Service: Cardiovascular;  Laterality: N/A;  . Video bronchoscopy N/A 11/17/2015    Procedure: VIDEO BRONCHOSCOPY WITH FLUORO;  Surgeon: Rigoberto Noel, MD;  Location: [facility];  Service: Cardiopulmonary;  Laterality: N/A;    REVIEW OF SYSTEMS:  A comprehensive review of systems was negative except for: Musculoskeletal: positive for neck pain   PHYSICAL EXAMINATION: General appearance: alert, cooperative and no distress Head: Normocephalic, without  obvious abnormality, atraumatic Neck: no adenopathy, no JVD, supple, symmetrical, trachea midline and thyroid not enlarged, symmetric, no tenderness/mass/nodules Lymph nodes: Cervical, supraclavicular, and axillary nodes normal. Resp: clear to auscultation bilaterally Back: symmetric, no curvature. ROM normal. No CVA tenderness. Cardio: regular rate and rhythm, S1, S2 normal, no murmur, click, rub or gallop GI: soft, non-tender; bowel sounds normal; no masses,  no organomegaly Extremities: extremities normal, atraumatic, no cyanosis or edema  ECOG PERFORMANCE STATUS: 1 - Symptomatic but completely ambulatory  Blood pressure 117/51, pulse 80, temperature 98.1 F (36.7 C), temperature source Oral, resp. rate 18, height '5\' 10"'$  (1.778 m), weight 184 lb 14.4 oz (83.87 kg), SpO2 100 %.  LABORATORY DATA: Lab Results  Component Value Date   WBC 6.7 12/19/2015   HGB 10.2* 12/19/2015   HCT 31.8* 12/19/2015   MCV 86.1 12/19/2015   PLT 190 12/19/2015      Chemistry      Component  Value Date/Time   NA 135* 12/19/2015 0932   NA 137 11/12/2015 0357   NA 140 08/23/2015 0853   K 4.6 12/19/2015 0932   K 4.3 11/12/2015 0357   CL 100* 11/12/2015 0357   CO2 28 12/19/2015 0932   CO2 27 11/12/2015 0357   BUN 19.9 12/19/2015 0932   BUN 21* 11/12/2015 0357   BUN 16 08/23/2015 0853   CREATININE 1.3 12/19/2015 0932   CREATININE 1.34* 11/12/2015 0357   CREATININE 1.63* 08/18/2014 0758      Component Value Date/Time   CALCIUM 9.7 12/19/2015 0932   CALCIUM 8.9 11/12/2015 0357   ALKPHOS 77 12/19/2015 0932   ALKPHOS 79 11/11/2015 2019   AST 19 12/19/2015 0932   AST 22 11/11/2015 2019   ALT 16 12/19/2015 0932   ALT 16* 11/11/2015 2019   BILITOT 0.45 12/19/2015 0932   BILITOT 0.5 11/11/2015 2019   BILITOT 0.6 08/23/2015 0853       RADIOGRAPHIC STUDIES: Dg Chest 2 View  11/24/2015  CLINICAL DATA:  75 year old male with cough productive of yellow mucus for 7 days. Initial encounter. Right  upper lobe lung mass discovered recently on radiographs and CT. Former smoker. EXAM: CHEST  2 VIEW COMPARISON:  11/17/2015 and earlier. FINDINGS: Indistinct right suprahilar lung mass re- demonstrated. No superimposed pneumothorax, pulmonary edema, pleural effusion or new pulmonary opacity. Stable cardiac size and mediastinal contours. Sequelae of CABG. Visualized tracheal air column is within normal limits. Stable visualized osseous structures. Partially visible surgical clips in the abdomen and bifurcated abdominal aortic endograft. IMPRESSION: 1. Right upper lobe lung mass re- demonstrated. See chest CTA report 11/11/2015. 2. No new cardiopulmonary abnormality. Electronically Signed   By: Genevie Ann M.D.   On: 11/24/2015 15:07   Mr Jeri Cos IO Contrast  12/08/2015  CLINICAL DATA:  Recent diagnosis of lung cancer. Staging. No neurologic complaints are reported. EXAM: MRI HEAD WITHOUT AND WITH CONTRAST TECHNIQUE: Multiplanar, multiecho pulse sequences of the brain and surrounding structures were obtained without and with intravenous contrast. CONTRAST:  98m MULTIHANCE GADOBENATE DIMEGLUMINE 529 MG/ML IV SOLN COMPARISON:  PET scan 11/25/2011. FINDINGS: No evidence for acute infarction, hemorrhage, mass lesion, hydrocephalus, or extra-axial fluid. Generalized atrophy. Mild T2 and FLAIR hyperintensities throughout the white matter, likely chronic microvascular ischemic change. Flow voids are maintained throughout the carotid, basilar, and vertebral arteries. There are no areas of chronic hemorrhage. Pituitary, pineal, and cerebellar tonsils unremarkable. No upper cervical lesions. Visualized calvarium, skull base, and upper cervical osseous structures unremarkable. Scalp and extracranial soft tissues, orbits, sinuses, and mastoids show no acute process. Post infusion, no abnormal enhancement of the brain or meninges. IMPRESSION: Chronic changes as described.  No acute intracranial findings. No metastatic disease is  observed. Electronically Signed   By: JStaci RighterM.D.   On: 12/08/2015 08:12   Nm Pet Image Initial (pi) Skull Base To Thigh  11/25/2015  CLINICAL DATA:  Initial treatment strategy for solitary pulmonary nodule. EXAM: NUCLEAR MEDICINE PET SKULL BASE TO THIGH TECHNIQUE: 9.2 mCi F-18 FDG was injected intravenously. Full-ring PET imaging was performed from the skull base to thigh after the radiotracer. CT data was obtained and used for attenuation correction and anatomic localization. FASTING BLOOD GLUCOSE:  Value: 94 mg/dl COMPARISON:  CT 11/11/2015 FINDINGS: NECK No hypermetabolic lymph nodes in the neck. CHEST RIGHT upper lobe mass abutting the mediastinal pleural surface measures 4.3 x 3.4 cm with intense metabolic activity (SUV max equal 31.9). No additional hypermetabolic nodules. Small focus of  nodular ground-glass density in the RIGHT middle lobe (image 49, series 6) is likely post infectious or inflammatory. Within the mediastinum there is enlarged hypermetabolic RIGHT lower paratracheal lymph node measuring 18 mm short axis with SUV max equal 13.4. There is hypermetabolic RIGHT hilar lymph node which is difficult to define on noncontrast CT. No contralateral hypermetabolic lymph nodes. No supraclavicular lymph nodes. ABDOMEN/PELVIS No abnormal hypermetabolic activity within the liver, pancreas, adrenal glands, or spleen. No hypermetabolic lymph nodes in the abdomen or pelvis. Aortic stent graft noted. Postcholecystectomy. Sigmoid diverticulosis SKELETON No focal hypermetabolic activity to suggest skeletal metastasis. IMPRESSION: 1. Hypermetabolic RIGHT upper lobe mass abutting the pleural surface consists with bronchogenic carcinoma. 2. Hypermetabolic RIGHT hilar and RIGHT lower paratracheal metastatic adenopathy. 3. No contralateral or supraclavicular hypermetabolic or enlarged lymph nodes. Electronically Signed   By: Suzy Bouchard M.D.   On: 11/25/2015 11:07    ASSESSMENT AND PLAN: This is a  very pleasant 75 years old white male recently diagnosed with a stage IIIa non-small cell lung cancer and he is currently undergoing a course of concurrent chemoradiation with weekly carboplatin and paclitaxel status post 1 cycle. He tolerated the first cycle of his treatment well with no significant adverse effects. I recommended for the patient to proceed with cycle #2 today as a scheduled. He would come back for follow-up visit in 2 weeks for evaluation and management of any adverse effect of his treatment. The patient was advised to call immediately if he has any concerning symptoms in the interval. The patient voices understanding of current disease status and treatment options and is in agreement with the current care plan.  All questions were answered. The patient knows to call the clinic with any problems, questions or concerns. We can certainly see the patient much sooner if necessary.  Disclaimer: This note was dictated with voice recognition software. Similar sounding words can inadvertently be transcribed and may not be corrected upon review.

## 2015-12-19 NOTE — Telephone Encounter (Signed)
Per staff message and POF I have scheduled appts. Advised scheduler of appts. JMW  

## 2015-12-20 ENCOUNTER — Ambulatory Visit
Admission: RE | Admit: 2015-12-20 | Discharge: 2015-12-20 | Disposition: A | Discharge status: Home / Self Care | Payer: Medicare Other | Source: Ambulatory Visit | Attending: Radiation Oncology | Admitting: Radiation Oncology

## 2015-12-20 DIAGNOSIS — C3411 Malignant neoplasm of upper lobe, right bronchus or lung: Secondary | ICD-10-CM | POA: Diagnosis not present

## 2015-12-20 DIAGNOSIS — Z510 Encounter for antineoplastic radiation therapy: Secondary | ICD-10-CM | POA: Diagnosis not present

## 2015-12-21 ENCOUNTER — Ambulatory Visit
Admission: RE | Admit: 2015-12-21 | Discharge: 2015-12-21 | Disposition: A | Discharge status: Home / Self Care | Payer: Medicare Other | Source: Ambulatory Visit | Attending: Radiation Oncology | Admitting: Radiation Oncology

## 2015-12-21 DIAGNOSIS — C3411 Malignant neoplasm of upper lobe, right bronchus or lung: Secondary | ICD-10-CM | POA: Diagnosis not present

## 2015-12-21 DIAGNOSIS — Z510 Encounter for antineoplastic radiation therapy: Secondary | ICD-10-CM | POA: Diagnosis not present

## 2015-12-22 ENCOUNTER — Encounter: Payer: Self-pay | Admitting: Radiation Oncology

## 2015-12-22 ENCOUNTER — Ambulatory Visit
Admission: RE | Admit: 2015-12-22 | Discharge: 2015-12-22 | Disposition: A | Discharge status: Home / Self Care | Payer: Medicare Other | Source: Ambulatory Visit | Attending: Radiation Oncology | Admitting: Radiation Oncology

## 2015-12-22 VITALS — BP 120/59 | HR 86 | Resp 16 | Wt 187.3 lb

## 2015-12-22 DIAGNOSIS — C3411 Malignant neoplasm of upper lobe, right bronchus or lung: Secondary | ICD-10-CM | POA: Diagnosis not present

## 2015-12-22 DIAGNOSIS — Z510 Encounter for antineoplastic radiation therapy: Secondary | ICD-10-CM | POA: Diagnosis not present

## 2015-12-22 NOTE — Progress Notes (Addendum)
Weight and vitals stable. Denies pain. Reports an occasional productive cough with clear sputum. Denies shortness of breath. Denies pain or difficulty associated with swallowing. Denies skin changes within treatment field. Reports using radiaplex bid as directed. Concerned about nocturia x 6-7 but, does report increased fluid intake. Reports an episode of diarrhea this morning that seems to be resolving.  BP 120/59 mmHg  Pulse 86  Resp 16  Wt 187 lb 4.8 oz (84.959 kg)  SpO2 100% Wt Readings from Last 3 Encounters:  12/22/15 187 lb 4.8 oz (84.959 kg)  12/19/15 184 lb 14.4 oz (83.87 kg)  12/16/15 187 lb 6.4 oz (85.004 kg)

## 2015-12-22 NOTE — Progress Notes (Signed)
  Radiation Oncology         [phone]   [name] MRN: [id number]   Date: 12/22/2015  DOB: Feb 01, 1941     Weekly Radiation Therapy Management    ICD-9-CM ICD-10-CM   1. Primary cancer of right upper lobe of lung (HCC) 162.3 C34.11     Current Dose: 18 Gy  Planned Dose:  66 Gy  Narrative The patient presents for routine under treatment assessment.  Weight and vitals stable. Denies pain. Reports an occasional productive cough with clear sputum. Denies shortness of breath. Denies pain or difficulty associated with swallowing. Denies skin changes within treatment field. Reports using radiaplex bid as directed. Concerned about nocturia x 6-7 but, does report increased fluid intake. He mentions he has a weak stream when he wakes up to urinate. Reports an episode of diarrhea this morning that seems to be resolving.  The patient is without complaint. Set-up films were reviewed. The chart was checked.  Physical Findings  weight is 187 lb 4.8 oz (84.959 kg). His blood pressure is 120/59 and his pulse is 86. His respiration is 16 and oxygen saturation is 100%. . Weight essentially stable.  No significant changes.  Impression The patient is tolerating radiation.  Plan Continue treatment as planned. We discussed cutting back on fluid intake or drinking earlier in the day. We discussed taking Flomax to help empty his bladder so he is not waking up as much at night.          Sheral Apley Tammi Klippel, M.D.    This document serves as a record of services personally performed by Tyler Pita, MD. It was created on his behalf by Lendon Collar, a trained medical scribe. The creation of this record is based on the scribe's personal observations and the provider's statements to them. This document has been checked and approved by the attending provider.

## 2015-12-23 ENCOUNTER — Other Ambulatory Visit: Payer: Self-pay | Admitting: Medical Oncology

## 2015-12-23 ENCOUNTER — Ambulatory Visit
Admission: RE | Admit: 2015-12-23 | Discharge: 2015-12-23 | Disposition: A | Discharge status: Home / Self Care | Payer: Medicare Other | Source: Ambulatory Visit | Attending: Radiation Oncology | Admitting: Radiation Oncology

## 2015-12-23 DIAGNOSIS — C3411 Malignant neoplasm of upper lobe, right bronchus or lung: Secondary | ICD-10-CM | POA: Diagnosis not present

## 2015-12-23 DIAGNOSIS — Z510 Encounter for antineoplastic radiation therapy: Secondary | ICD-10-CM | POA: Diagnosis not present

## 2015-12-26 ENCOUNTER — Ambulatory Visit: Payer: Medicare Other

## 2015-12-26 ENCOUNTER — Other Ambulatory Visit (HOSPITAL_BASED_OUTPATIENT_CLINIC_OR_DEPARTMENT_OTHER): Payer: Medicare Other

## 2015-12-26 ENCOUNTER — Ambulatory Visit: Payer: Medicare Other | Admitting: Family Medicine

## 2015-12-26 ENCOUNTER — Ambulatory Visit
Admission: RE | Admit: 2015-12-26 | Discharge: 2015-12-26 | Disposition: A | Discharge status: Home / Self Care | Payer: Medicare Other | Source: Ambulatory Visit | Attending: Radiation Oncology | Admitting: Radiation Oncology

## 2015-12-26 ENCOUNTER — Other Ambulatory Visit: Payer: Self-pay | Admitting: *Deleted

## 2015-12-26 ENCOUNTER — Telehealth: Payer: Self-pay | Admitting: *Deleted

## 2015-12-26 VITALS — BP 120/53 | HR 89 | Temp 97.5°F | Resp 16

## 2015-12-26 DIAGNOSIS — C3411 Malignant neoplasm of upper lobe, right bronchus or lung: Secondary | ICD-10-CM

## 2015-12-26 DIAGNOSIS — Z510 Encounter for antineoplastic radiation therapy: Secondary | ICD-10-CM | POA: Diagnosis not present

## 2015-12-26 LAB — CBC WITH DIFFERENTIAL/PLATELET
BASO%: 0.6 % (ref 0.0–2.0)
Basophils Absolute: 0 10*3/uL (ref 0.0–0.1)
EOS ABS: 0 10*3/uL (ref 0.0–0.5)
EOS%: 0.6 % (ref 0.0–7.0)
HCT: 32.2 % — ABNORMAL LOW (ref 38.4–49.9)
HEMOGLOBIN: 10.5 g/dL — AB (ref 13.0–17.1)
LYMPH#: 0.5 10*3/uL — AB (ref 0.9–3.3)
LYMPH%: 25.1 % (ref 14.0–49.0)
MCH: 27.9 pg (ref 27.2–33.4)
MCHC: 32.6 g/dL (ref 32.0–36.0)
MCV: 85.6 fL (ref 79.3–98.0)
MONO#: 0.2 10*3/uL (ref 0.1–0.9)
MONO%: 11.7 % (ref 0.0–14.0)
NEUT%: 62 % (ref 39.0–75.0)
NEUTROS ABS: 1.1 10*3/uL — AB (ref 1.5–6.5)
PLATELETS: 110 10*3/uL — AB (ref 140–400)
RBC: 3.76 10*6/uL — ABNORMAL LOW (ref 4.20–5.82)
RDW: 14.4 % (ref 11.0–14.6)
WBC: 1.8 10*3/uL — AB (ref 4.0–10.3)

## 2015-12-26 MED ORDER — RADIAPLEXRX EX GEL
Freq: Once | CUTANEOUS | Status: AC
  Administered 2015-12-26: 10:00:00 via TOPICAL

## 2015-12-26 NOTE — Progress Notes (Signed)
Received patient in nursing following radiation treatment. Patient concerned about new onset rash on his neck, right cheek, left forearm and forehead. Vitals stable. Patient afebrile. Patient confirms the rash doesn't itch. No rash, hyperpigmentation or desquamation within radiation treatment field. Called Amy, RN in infusion informing her of this rash since the patient is scheduled for Taxol/carbo today at 1015.

## 2015-12-26 NOTE — Patient Instructions (Signed)
Neutropenia Neutropenia is a condition that occurs when the level of a certain type of white blood cell (neutrophil) in your body becomes lower than normal. Neutrophils are made in the bone marrow and fight infections. These cells protect against bacteria and viruses. The fewer neutrophils you have, and the longer your body remains without them, the greater your risk of getting a severe infection becomes. CAUSES  The cause of neutropenia may be hard to determine. However, it is usually due to 3 main problems:   Decreased production of neutrophils. This may be due to:  Certain medicines such as chemotherapy.  Genetic problems.  Cancer.  Radiation treatments.  Vitamin deficiency.  Some pesticides.  Increased destruction of neutrophils. This may be due to:  Overwhelming infections.  Hemolytic anemia. This is when the body destroys its own blood cells.  Chemotherapy.  Neutrophils moving to areas of the body where they cannot fight infections. This may be due to:  Dialysis procedures.  Conditions where the spleen becomes enlarged. Neutrophils are held in the spleen and are not available to the rest of the body.  Overwhelming infections. The neutrophils are held in the area of the infection and are not available to the rest of the body. SYMPTOMS  There are no specific symptoms of neutropenia. The lack of neutrophils can result in an infection, and an infection can cause various problems. DIAGNOSIS  Diagnosis is made by a blood test. A complete blood count is performed. The normal level of neutrophils in human blood differs with age and race. Infants have lower counts than older children and adults. African Americans have lower counts than Caucasians or Asians. The average adult level is 1500 cells/mm3 of blood. Neutrophil counts are interpreted as follows:  Greater than 1000 cells/mm3 gives normal protection against infection.  500 to 1000 cells/mm3 gives an increased risk for  infection.  200 to 500 cells/mm3 is a greater risk for severe infection.  Lower than 200 cells/mm3 is a marked risk of infection. This may require hospitalization and treatment with antibiotic medicines. TREATMENT  Treatment depends on the underlying cause, severity, and presence of infections or symptoms. It also depends on your health. Your caregiver will discuss the treatment plan with you. Mild cases are often easily treated and have a good outcome. Preventative measures may also be started to limit your risk of infections. Treatment can include:  Taking antibiotics.  Stopping medicines that are known to cause neutropenia.  Correcting nutritional deficiencies by eating green vegetables to supply folic acid and taking vitamin B supplements.  Stopping exposure to pesticides if your neutropenia is related to pesticide exposure.  Taking a blood growth factor called sargramostim, pegfilgrastim, or filgrastim if you are undergoing chemotherapy for cancer. This stimulates white blood cell production.  Removal of the spleen if you have Felty's syndrome and have repeated infections. HOME CARE INSTRUCTIONS   Follow your caregiver's instructions about when you need to have blood work done.  Wash your hands often. Make sure others who come in contact with you also wash their hands.  Wash raw fruits and vegetables before eating them. They can carry bacteria and fungi.  Avoid people with colds or spreadable (contagious) diseases (chickenpox, herpes zoster, influenza).  Avoid large crowds.  Avoid construction areas. The dust can release fungus into the air.  Be cautious around children in daycare or school environments.  Take care of your respiratory system by coughing and deep breathing.  Bathe daily.  Protect your skin from cuts and   burns.  Do not work in the garden or with flowers and plants.  Care for the mouth before and after meals by brushing with a soft toothbrush. If you have  mucositis, do not use mouthwash. Mouthwash contains alcohol and can dry out the mouth even more.  Clean the area between the genitals and the anus (perineal area) after urination and bowel movements. Women need to wipe from front to back.  Use a water soluble lubricant during sexual intercourse and practice good hygiene after. Do not have intercourse if you are severely neutropenic. Check with your caregiver for guidelines.  Exercise daily as tolerated.  Avoid people who were vaccinated with a live vaccine in the past 30 days. You should not receive live vaccines (polio, typhoid).  Do not provide direct care for pets. Avoid animal droppings. Do not clean litter boxes and bird cages.  Do not share food utensils.  Do not use tampons, enemas, or rectal suppositories unless directed by your caregiver.  Use an electric razor to remove hair.  Wash your hands after handling magazines, letters, and newspapers. SEEK IMMEDIATE MEDICAL CARE IF:   You have a fever.  You have chills or start to shake.  You feel nauseous or vomit.  You develop mouth sores.  You develop aches and pains.  You have redness and swelling around open wounds.  Your skin is warm to the touch.  You have pus coming from your wounds.  You develop swollen lymph nodes.  You feel weak or fatigued.  You develop red streaks on the skin. MAKE SURE YOU:  Understand these instructions.  Will watch your condition.  Will get help right away if you are not doing well or get worse.   This information is not intended to replace advice given to you by your health care provider. Make sure you discuss any questions you have with your health care provider.   Document Released: 02/02/2002 Document Revised: 11/05/2011 Document Reviewed: 02/23/2015 Elsevier Interactive Patient Education [employer].

## 2015-12-26 NOTE — Telephone Encounter (Signed)
Oncology Nurse Navigator Documentation  Oncology Nurse Navigator Flowsheets 12/26/2015  Navigator Encounter Type Telephone  Telephone Outgoing Call  Treatment Phase Treatment  Barriers/Navigation Needs Education  Education Other  Interventions Education Method  Education Method Verbal  Acuity Level 2  Acuity Level 2 Educational needs  Time Spent with Patient 30   Paul Sandoval called and left me a vm message to call.  I called him back.  He had questions about why he could not get his tx today.  I explained.  He was thankful for the call back.

## 2015-12-26 NOTE — Progress Notes (Signed)
Reviewed labs. ANC is 1.1  Pt states he has been having diarrhea just about every day-sometimes mixed with 'regular' bowel movement and sometimes all diarrhea.  Denies fever, chills, nausea or vomiting. Has also developed a rash -small reddish raised bumps-on face, forehead, neck and arms.  Denies itching.  Treatment to be held today per Dr. Julien Nordmann. Pt to return next week for labs and treatment if counts have recovered.  Educated pt on neutropenic precautions, Imodium for diarrhea, keeping hydrated and benadryl for rash as needed.  Pt and wife voice understanding.

## 2015-12-27 ENCOUNTER — Ambulatory Visit
Admission: RE | Admit: 2015-12-27 | Discharge: 2015-12-27 | Disposition: A | Discharge status: Home / Self Care | Payer: Medicare Other | Source: Ambulatory Visit | Attending: Radiation Oncology | Admitting: Radiation Oncology

## 2015-12-27 DIAGNOSIS — C3411 Malignant neoplasm of upper lobe, right bronchus or lung: Secondary | ICD-10-CM | POA: Diagnosis not present

## 2015-12-27 DIAGNOSIS — Z510 Encounter for antineoplastic radiation therapy: Secondary | ICD-10-CM | POA: Diagnosis not present

## 2015-12-28 ENCOUNTER — Ambulatory Visit
Admission: RE | Admit: 2015-12-28 | Discharge: 2015-12-28 | Disposition: A | Discharge status: Home / Self Care | Payer: Medicare Other | Source: Ambulatory Visit | Attending: Radiation Oncology | Admitting: Radiation Oncology

## 2015-12-28 DIAGNOSIS — Z510 Encounter for antineoplastic radiation therapy: Secondary | ICD-10-CM | POA: Diagnosis not present

## 2015-12-28 DIAGNOSIS — C3411 Malignant neoplasm of upper lobe, right bronchus or lung: Secondary | ICD-10-CM | POA: Diagnosis not present

## 2015-12-29 ENCOUNTER — Encounter: Payer: Self-pay | Admitting: Radiation Oncology

## 2015-12-29 ENCOUNTER — Ambulatory Visit
Admission: RE | Admit: 2015-12-29 | Discharge: 2015-12-29 | Disposition: A | Discharge status: Home / Self Care | Payer: Medicare Other | Source: Ambulatory Visit | Attending: Radiation Oncology | Admitting: Radiation Oncology

## 2015-12-29 VITALS — BP 112/50 | HR 76 | Temp 97.6°F | Resp 16 | Wt 184.2 lb

## 2015-12-29 DIAGNOSIS — C3411 Malignant neoplasm of upper lobe, right bronchus or lung: Secondary | ICD-10-CM | POA: Diagnosis not present

## 2015-12-29 DIAGNOSIS — Z510 Encounter for antineoplastic radiation therapy: Secondary | ICD-10-CM | POA: Diagnosis not present

## 2015-12-29 LAB — FUNGUS CULTURE WITH STAIN

## 2015-12-29 LAB — FUNGAL ORGANISM REFLEX: Fungal result 1: 2

## 2015-12-29 LAB — FUNGUS CULTURE RESULT

## 2015-12-29 NOTE — Progress Notes (Signed)
  Radiation Oncology         [phone]   [name] MRN: [id number]   Date: 12/29/2015  DOB: 29-Jan-1941     Weekly Radiation Therapy Management    ICD-9-CM ICD-10-CM   1. Primary cancer of right upper lobe of lung (HCC) 162.3 C34.11     Current Dose: 28 Gy  Planned Dose:  66 Gy  Narrative The patient presents for routine under treatment assessment.  Weekly rad tx RUL 14/33 completed , no redness, uses radiaplex bid, skin intact, no coughing, no nausea, no difficulty swallowing,. Appetite good, Chemotherapy held, counts too low, will recheck next week,, energy level better this week stated, no c/o pain  The patient is without complaint. Set-up films were reviewed. The chart was checked.  Physical Findings  weight is 184 lb 3.2 oz (83.553 kg). His oral temperature is 97.6 F (36.4 C). His blood pressure is 112/50 and his pulse is 76. His respiration is 16 and oxygen saturation is 100%. . Weight essentially stable.  No significant changes.  Impression The patient is tolerating radiation.  Plan Continue treatment as planned.      Sheral Apley Tammi Klippel, M.D.  This document serves as a record of services personally performed by Tyler Pita, MD. It was created on his behalf by Derek Mound, a trained medical scribe. The creation of this record is based on the scribe's personal observations and the provider's statements to them. This document has been checked and approved by the attending provider.

## 2015-12-29 NOTE — Progress Notes (Signed)
Weekly rad tx RUL 14/33 completed , no redness, uses radiaplex bid, skin intact, no coughing, no nausea, no difficulty swallowing,.  Appetite good, Chemotherapy held, counts too low, will recheck next week,, energy level better this week stated, no c/o pain BP 112/50 mmHg  Pulse 76  Temp(Src) 97.6 F (36.4 C) (Oral)  Resp 16  Wt 184 lb 3.2 oz (83.553 kg)  SpO2 100%  Wt Readings from Last 3 Encounters:  12/29/15 184 lb 3.2 oz (83.553 kg)  12/22/15 187 lb 4.8 oz (84.959 kg)  12/19/15 184 lb 14.4 oz (83.87 kg)

## 2015-12-30 ENCOUNTER — Ambulatory Visit: Payer: Medicare Other | Admitting: Family Medicine

## 2015-12-30 ENCOUNTER — Ambulatory Visit
Admission: RE | Admit: 2015-12-30 | Discharge: 2015-12-30 | Disposition: A | Discharge status: Home / Self Care | Payer: Medicare Other | Source: Ambulatory Visit | Attending: Radiation Oncology | Admitting: Radiation Oncology

## 2015-12-30 DIAGNOSIS — C3411 Malignant neoplasm of upper lobe, right bronchus or lung: Secondary | ICD-10-CM | POA: Diagnosis not present

## 2015-12-30 DIAGNOSIS — Z510 Encounter for antineoplastic radiation therapy: Secondary | ICD-10-CM | POA: Diagnosis not present

## 2015-12-30 LAB — ACID FAST CULTURE WITH REFLEXED SENSITIVITIES (MYCOBACTERIA): Acid Fast Culture: NEGATIVE

## 2016-01-02 ENCOUNTER — Other Ambulatory Visit (HOSPITAL_BASED_OUTPATIENT_CLINIC_OR_DEPARTMENT_OTHER): Payer: Medicare Other

## 2016-01-02 ENCOUNTER — Ambulatory Visit (HOSPITAL_BASED_OUTPATIENT_CLINIC_OR_DEPARTMENT_OTHER): Payer: Medicare Other

## 2016-01-02 ENCOUNTER — Ambulatory Visit (HOSPITAL_BASED_OUTPATIENT_CLINIC_OR_DEPARTMENT_OTHER): Payer: Medicare Other | Admitting: Oncology

## 2016-01-02 ENCOUNTER — Ambulatory Visit
Admission: RE | Admit: 2016-01-02 | Discharge: 2016-01-02 | Disposition: A | Discharge status: Home / Self Care | Payer: Medicare Other | Source: Ambulatory Visit | Attending: Radiation Oncology | Admitting: Radiation Oncology

## 2016-01-02 ENCOUNTER — Encounter: Payer: Self-pay | Admitting: Oncology

## 2016-01-02 VITALS — BP 135/53 | HR 81 | Temp 97.5°F | Resp 17 | Ht 70.0 in | Wt 184.2 lb

## 2016-01-02 DIAGNOSIS — Z510 Encounter for antineoplastic radiation therapy: Secondary | ICD-10-CM | POA: Diagnosis not present

## 2016-01-02 DIAGNOSIS — C771 Secondary and unspecified malignant neoplasm of intrathoracic lymph nodes: Secondary | ICD-10-CM

## 2016-01-02 DIAGNOSIS — C3411 Malignant neoplasm of upper lobe, right bronchus or lung: Secondary | ICD-10-CM

## 2016-01-02 DIAGNOSIS — Z5111 Encounter for antineoplastic chemotherapy: Secondary | ICD-10-CM

## 2016-01-02 LAB — CBC WITH DIFFERENTIAL/PLATELET
BASO%: 0.8 % (ref 0.0–2.0)
BASOS ABS: 0 10*3/uL (ref 0.0–0.1)
EOS ABS: 0 10*3/uL (ref 0.0–0.5)
EOS%: 0.5 % (ref 0.0–7.0)
HCT: 34.4 % — ABNORMAL LOW (ref 38.4–49.9)
HGB: 10.9 g/dL — ABNORMAL LOW (ref 13.0–17.1)
LYMPH%: 23.2 % (ref 14.0–49.0)
MCH: 27.6 pg (ref 27.2–33.4)
MCHC: 31.7 g/dL — ABNORMAL LOW (ref 32.0–36.0)
MCV: 86.9 fL (ref 79.3–98.0)
MONO#: 0.7 10*3/uL (ref 0.1–0.9)
MONO%: 24 % — AB (ref 0.0–14.0)
NEUT#: 1.4 10*3/uL — ABNORMAL LOW (ref 1.5–6.5)
NEUT%: 51.5 % (ref 39.0–75.0)
Platelets: 88 10*3/uL — ABNORMAL LOW (ref 140–400)
RBC: 3.96 10*6/uL — AB (ref 4.20–5.82)
RDW: 17 % — ABNORMAL HIGH (ref 11.0–14.6)
WBC: 2.7 10*3/uL — AB (ref 4.0–10.3)
lymph#: 0.6 10*3/uL — ABNORMAL LOW (ref 0.9–3.3)

## 2016-01-02 MED ORDER — SODIUM CHLORIDE 0.9 % IV SOLN
20.0000 mg | Freq: Once | INTRAVENOUS | Status: AC
  Administered 2016-01-02: 20 mg via INTRAVENOUS
  Filled 2016-01-02: qty 2

## 2016-01-02 MED ORDER — FAMOTIDINE IN NACL 20-0.9 MG/50ML-% IV SOLN
INTRAVENOUS | Status: AC
  Filled 2016-01-02: qty 50

## 2016-01-02 MED ORDER — DIPHENHYDRAMINE HCL 50 MG/ML IJ SOLN
50.0000 mg | Freq: Once | INTRAMUSCULAR | Status: AC
  Administered 2016-01-02: 50 mg via INTRAVENOUS

## 2016-01-02 MED ORDER — PALONOSETRON HCL INJECTION 0.25 MG/5ML
INTRAVENOUS | Status: AC
  Filled 2016-01-02: qty 5

## 2016-01-02 MED ORDER — SODIUM CHLORIDE 0.9 % IV SOLN
160.0000 mg | Freq: Once | INTRAVENOUS | Status: AC
  Administered 2016-01-02: 160 mg via INTRAVENOUS
  Filled 2016-01-02: qty 16

## 2016-01-02 MED ORDER — PALONOSETRON HCL INJECTION 0.25 MG/5ML
0.2500 mg | Freq: Once | INTRAVENOUS | Status: AC
  Administered 2016-01-02: 0.25 mg via INTRAVENOUS

## 2016-01-02 MED ORDER — DIPHENHYDRAMINE HCL 50 MG/ML IJ SOLN
INTRAMUSCULAR | Status: AC
  Filled 2016-01-02: qty 1

## 2016-01-02 MED ORDER — SODIUM CHLORIDE 0.9 % IV SOLN
45.0000 mg/m2 | Freq: Once | INTRAVENOUS | Status: AC
  Administered 2016-01-02: 90 mg via INTRAVENOUS
  Filled 2016-01-02: qty 15

## 2016-01-02 MED ORDER — SODIUM CHLORIDE 0.9 % IV SOLN
Freq: Once | INTRAVENOUS | Status: AC
  Administered 2016-01-02: 12:00:00 via INTRAVENOUS

## 2016-01-02 MED ORDER — FAMOTIDINE IN NACL 20-0.9 MG/50ML-% IV SOLN
20.0000 mg | Freq: Once | INTRAVENOUS | Status: AC
  Administered 2016-01-02: 20 mg via INTRAVENOUS

## 2016-01-02 NOTE — Progress Notes (Signed)
OFFICE PROGRESS NOTE  [name], MD [place] [address]  DIAGNOSIS: Stage IIIa (T2b, N0, M0) non-small cell lung cancer presented with right upper lobe lung mass in addition to mediastinal lymphadenopathy diagnosed in March 2017.  PRIOR THERAPY: None  CURRENT THERAPY: Concurrent chemoradiation with weekly carboplatin for AUC of 2 and paclitaxel 45 mg meter squared. First dose given on 12/12/2015. Status post 2 cycles.  INTERVAL HISTORY: Paul Sandoval 75 y.o. male returns for regular follow-up for lung cancer by himself. Chemotherapy was held last week due to neutropenia. He is not feeling well over the past week. He denies fevers, chills, chest pain, shortness of breath, cough. Denies hemoptysis or other bleeding. Denies abdominal pain, nausea, and vomiting. He had a rash on his face last week when he arrived for chemotherapy and was advised to take Benadryl. The rash has resolved at this time. The patient is here for consideration of cycle 3 of his chemotherapy today.  MEDICAL HISTORY: Past Medical History  Diagnosis Date  . 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 AAA (abdominal aortic aneurysm) ([facility]) 2010    4.4 cm 08/2008;4.44 in 7/10 and 4.65 in 08/2009; 4.8 by CT in 11/2009; 4.3 by ultrasound in 08/2010  . Hypertension   . Hyperlipidemia   . Tobacco abuse, in remission     40 pack year total consumption; discontinued in 1996  . GERD (gastroesophageal reflux disease)   . Right bundle branch block   . Diverticulosis   . Colonic polyp 2002    polypectomy in 2002  . COPD (chronic obstructive pulmonary disease) ([facility])   . CAD (coronary artery disease)     03/18/14:  PCI with DES to distal left main. 7/29: DES to the SVG to Diag  . ED (erectile dysfunction)   . IFG (impaired fasting glucose)   . Chronic rhinitis   . Myocardial infarction [facility])     "told h/o silent MI sometime before 1996"  . Pneumonia ~ 2001; ~ 2005  .  Chronic bronchitis ([facility])   . Arthritis     "fingers" (03/18/2014)  . Cardiomyopathy, ischemic     Echo 03/17/14: EF 45-50%  . Encounter for antineoplastic chemotherapy 12/19/2015    ALLERGIES:  is allergic to neomycin.  MEDICATIONS:  Current Outpatient Prescriptions  Medication Sig Dispense Refill  . acetaminophen (TYLENOL) 500 MG tablet Take 500 mg by mouth every 6 (six) hours as needed for mild pain.    Marland Kitchen Clopidogrel Bisulfate (PLAVIX PO) Take 75 mg by mouth daily. Reported on 12/02/2015    . enalapril (VASOTEC) 10 MG tablet Take one and a half tablets PO BID 90 tablet 5  . Ferrous Sulfate (IRON) 28 MG TABS Take 28 mg by mouth daily.    . hydrochlorothiazide (HYDRODIURIL) 25 MG tablet Take 0.5 tablets (12.5 mg total) by mouth daily.    . metoprolol (LOPRESSOR) 50 MG tablet Take 50 mg by mouth 2 (two) times daily.     . Multiple Vitamins-Minerals (CENTRUM SILVER ADULT 50+) TABS Take 1 tablet by mouth daily.     . nitroGLYCERIN (NITROSTAT) 0.4 MG SL tablet Place 1 tablet (0.4 mg total) under the tongue every 5 (five) minutes as needed for chest pain. (Patient not taking: Reported on 12/19/2015) 25 tablet 4  . omeprazole (PRILOSEC) 20 MG capsule Take 1 capsule (20 mg total) by mouth daily. 90 capsule 3  . prochlorperazine (COMPAZINE) 10 MG tablet Take 1 tablet (10 mg  total) by mouth every 6 (six) hours as needed for nausea or vomiting. (Patient not taking: Reported on 12/02/2015) 30 tablet 0  . simvastatin (ZOCOR) 80 MG tablet Take 0.5 tablets (40 mg total) by mouth at bedtime. 15 tablet 5  . VENTOLIN HFA 108 (90 Base) MCG/ACT inhaler INHALE 2 PUFFS BY MOUTH EVERY 4 TO 6 HOURS AS NEEDED FOR WHEEZING. 18 g 5  . Wound Cleansers (RADIAPLEX EX) Apply topically.     No current facility-administered medications for this visit.    SURGICAL HISTORY:  Past Surgical History  Procedure Laterality Date  . Colonoscopy  2002    polypectomy-patient denies  . Laparoscopic cholecystectomy  12/2009  .  Abdominal aortic endovascular stent graft N/A 12/11/2012    Procedure: ABDOMINAL AORTIC ENDOVASCULAR STENT GRAFT;  Surgeon: Mal Misty, MD;  Location: [facility];  Service: Vascular;  Laterality: N/A;  Ultrasound guided; [place]  . Abdominal aortic aneurysm repair  11/2012  . Total hip arthroplasty Left 01/21/2013    Procedure: TOTAL HIP ARTHROPLASTY ANTERIOR APPROACH;  Surgeon: Mauri Pole, MD;  Location: [facility];  Service: Orthopedics;  Laterality: Left;  . Joint replacement    . Coronary artery bypass graft  01/09/1995    "CABG X3"  . Cardiac catheterization  01/08/1995  . Coronary angioplasty with stent placement  03/18/2014    "1"  . Coronary angioplasty with stent placement  03/24/2014    "1"  . Left and right heart catheterization with coronary/graft angiogram N/A 03/18/2014    Procedure: LEFT AND RIGHT HEART CATHETERIZATION WITH Beatrix Fetters;  Surgeon: Blane Ohara, MD;  Location: [facility] CATH LAB;  Service: Cardiovascular;  Laterality: N/A;  . Percutaneous coronary stent intervention (pci-s)  03/18/2014    Procedure: PERCUTANEOUS CORONARY STENT INTERVENTION (PCI-S);  Surgeon: Blane Ohara, MD;  Location: [facility] CATH LAB;  Service: Cardiovascular;;  . Percutaneous coronary stent intervention (pci-s) N/A 03/24/2014    Procedure: PERCUTANEOUS CORONARY STENT INTERVENTION (PCI-S);  Surgeon: Blane Ohara, MD;  Location: [facility] CATH LAB;  Service: Cardiovascular;  Laterality: N/A;  . Video bronchoscopy N/A 11/17/2015    Procedure: VIDEO BRONCHOSCOPY WITH FLUORO;  Surgeon: Rigoberto Noel, MD;  Location: [facility];  Service: Cardiopulmonary;  Laterality: N/A;    REVIEW OF SYSTEMS:  Review of Systems  Constitutional: Negative.   HENT: Negative.   Eyes: Negative.   Respiratory: Negative.   Cardiovascular: Negative.   Gastrointestinal: Negative.   Genitourinary: Negative.   Musculoskeletal: Negative.   Skin: Negative.   Neurological: Negative.   Endo/Heme/Allergies: Negative.    Psychiatric/Behavioral: Negative.      PHYSICAL EXAMINATION: Physical Exam  Constitutional: He is oriented to person, place, and time and well-developed, well-nourished, and in no distress.  HENT:  Head: Normocephalic and atraumatic.  Mouth/Throat: Oropharynx is clear and moist.  Eyes: Conjunctivae and EOM are normal. Pupils are equal, round, and reactive to light.  Neck: Normal range of motion. Neck supple.  Cardiovascular: Normal rate, regular rhythm and normal heart sounds.  Exam reveals no gallop and no friction rub.   No murmur heard. Pulmonary/Chest: Effort normal and breath sounds normal.  Abdominal: Soft. Bowel sounds are normal.  Musculoskeletal: Normal range of motion. He exhibits no edema or tenderness.  Lymphadenopathy:    He has no cervical adenopathy.  Neurological: He is alert and oriented to person, place, and time. Gait normal.  Skin: Skin is warm and dry.  Psychiatric: Mood, memory, affect and judgment normal.  Nursing note and vitals reviewed.  ECOG PERFORMANCE STATUS: 1 - Symptomatic but completely ambulatory  Blood pressure 135/53, pulse 81, temperature 97.5 F (36.4 C), temperature source Oral, resp. rate 17, height '5\' 10"'$  (1.778 m), weight 184 lb 3.2 oz (83.553 kg), SpO2 99 %.  LABORATORY DATA: Lab Results  Component Value Date   WBC 2.7* 01/02/2016   HGB 10.9* 01/02/2016   HCT 34.4* 01/02/2016   MCV 86.9 01/02/2016   PLT 88* 01/02/2016      Chemistry      Component Value Date/Time   NA 135* 12/19/2015 0932   NA 137 11/12/2015 0357   NA 140 08/23/2015 0853   K 4.6 12/19/2015 0932   K 4.3 11/12/2015 0357   CL 100* 11/12/2015 0357   CO2 28 12/19/2015 0932   CO2 27 11/12/2015 0357   BUN 19.9 12/19/2015 0932   BUN 21* 11/12/2015 0357   BUN 16 08/23/2015 0853   CREATININE 1.3 12/19/2015 0932   CREATININE 1.34* 11/12/2015 0357   CREATININE 1.63* 08/18/2014 0758      Component Value Date/Time   CALCIUM 9.7 12/19/2015 0932   CALCIUM 8.9  11/12/2015 0357   ALKPHOS 77 12/19/2015 0932   ALKPHOS 79 11/11/2015 2019   AST 19 12/19/2015 0932   AST 22 11/11/2015 2019   ALT 16 12/19/2015 0932   ALT 16* 11/11/2015 2019   BILITOT 0.45 12/19/2015 0932   BILITOT 0.5 11/11/2015 2019   BILITOT 0.6 08/23/2015 0853       RADIOGRAPHIC STUDIES:  Mr Jeri Cos Wo Contrast  12/08/2015  CLINICAL DATA:  Recent diagnosis of lung cancer. Staging. No neurologic complaints are reported. EXAM: MRI HEAD WITHOUT AND WITH CONTRAST TECHNIQUE: Multiplanar, multiecho pulse sequences of the brain and surrounding structures were obtained without and with intravenous contrast. CONTRAST:  63m MULTIHANCE GADOBENATE DIMEGLUMINE 529 MG/ML IV SOLN COMPARISON:  PET scan 11/25/2011. FINDINGS: No evidence for acute infarction, hemorrhage, mass lesion, hydrocephalus, or extra-axial fluid. Generalized atrophy. Mild T2 and FLAIR hyperintensities throughout the white matter, likely chronic microvascular ischemic change. Flow voids are maintained throughout the carotid, basilar, and vertebral arteries. There are no areas of chronic hemorrhage. Pituitary, pineal, and cerebellar tonsils unremarkable. No upper cervical lesions. Visualized calvarium, skull base, and upper cervical osseous structures unremarkable. Scalp and extracranial soft tissues, orbits, sinuses, and mastoids show no acute process. Post infusion, no abnormal enhancement of the brain or meninges. IMPRESSION: Chronic changes as described.  No acute intracranial findings. No metastatic disease is observed. Electronically Signed   By: JStaci RighterM.D.   On: 12/08/2015 08:12     ASSESSMENT/PLAN:  No problem-specific assessment & plan notes found for this encounter. This is a 75year old white male recently diagnosed with stage IIIa non-small cell lung cancer. He is receiving concurrent chemoradiation with chemotherapy consists of carboplatin for an AUC of 2 and paclitaxel 45 mg meter squared. He is overall  tolerating his treatment well with the exception of development of neutropenia after his second cycle of chemotherapy.   Counts reviewed with Dr. MJulien Nordmannshowing an ADana Pointis 1.4 platelets are 88,000. Counts are adequate to proceed with chemotherapy today. The patient will proceed with cycle 3 of his chemotherapy as scheduled today.   He will return in one week for labs and chemotherapy and for a visit in 2 weeks. He will continue radiation under the direction of Dr. MTammi Klippel  All questions were answered. The patient knows to call the clinic with any problems, questions or concerns. We can certainly see the patient  much sooner if necessary.  Mikey Bussing, DNP, AGPCNP-BC, AOCNP 01/02/2016

## 2016-01-02 NOTE — Progress Notes (Signed)
ANC 1.4, plt 88, no cmet ordered today. Per Cyril Mourning, DNP, [place] to treat with taxol/carbo.

## 2016-01-03 ENCOUNTER — Ambulatory Visit
Admission: RE | Admit: 2016-01-03 | Discharge: 2016-01-03 | Disposition: A | Discharge status: Home / Self Care | Payer: Medicare Other | Source: Ambulatory Visit | Attending: Radiation Oncology | Admitting: Radiation Oncology

## 2016-01-03 DIAGNOSIS — Z510 Encounter for antineoplastic radiation therapy: Secondary | ICD-10-CM | POA: Diagnosis not present

## 2016-01-03 DIAGNOSIS — C3411 Malignant neoplasm of upper lobe, right bronchus or lung: Secondary | ICD-10-CM | POA: Diagnosis not present

## 2016-01-04 ENCOUNTER — Ambulatory Visit: Payer: Medicare Other | Admitting: Nutrition

## 2016-01-04 ENCOUNTER — Ambulatory Visit
Admission: RE | Admit: 2016-01-04 | Discharge: 2016-01-04 | Disposition: A | Discharge status: Home / Self Care | Payer: Medicare Other | Source: Ambulatory Visit | Attending: Radiation Oncology | Admitting: Radiation Oncology

## 2016-01-04 DIAGNOSIS — Z510 Encounter for antineoplastic radiation therapy: Secondary | ICD-10-CM | POA: Diagnosis not present

## 2016-01-04 DIAGNOSIS — C3411 Malignant neoplasm of upper lobe, right bronchus or lung: Secondary | ICD-10-CM | POA: Diagnosis not present

## 2016-01-04 NOTE — Progress Notes (Signed)
75 year old male diagnosed with non-small cell lung cancer receiving concurrent chemoradiation therapy.  Past medical history includes AAA, hypertension, hyperlipidemia, tobacco, GERD, diverticulosis, COPD, CAD and MI.  Medications include iron, multivitamin, Prilosec, Compazine, and Zocor.  Labs include sodium 135 and albumin 3.0 on April 24.  Height: 5 feet 10 inches. Weight: 188 pounds on May 10. Usual body weight: 190 pounds. BMI: 26.98.  Patient denies nutrition impact symptoms. He states he swallows without pain and is eating well. He has boost protein shakes available but has not had to drink them yet. Denies nutrition impact symptoms from chemotherapy.  Nutrition diagnosis: Food and nutrition related knowledge deficit related to non-small cell lung cancer as evidenced by no prior need for nutrition related information.  Intervention: Educated patient to consume calories and protein to promote weight maintenance. Reviewed high protein foods with patient.  Provided fact sheet on increasing calories and protein. Brief education provided should he develop difficulty swallowing. Questions were answered.  Teach back method used.  Contact information was provided.  Monitoring, evaluation, goals: Patient will tolerate adequate calories and protein to promote weight maintenance.  Next visit: No follow-up has been scheduled.  Patient has my phone number for questions.  **Disclaimer: This note was dictated with voice recognition software. Similar sounding words can inadvertently be transcribed and this note may contain transcription errors which may not have been corrected upon publication of note.**

## 2016-01-05 ENCOUNTER — Encounter: Payer: Self-pay | Admitting: Radiation Oncology

## 2016-01-05 ENCOUNTER — Ambulatory Visit
Admission: RE | Admit: 2016-01-05 | Discharge: 2016-01-05 | Disposition: A | Discharge status: Home / Self Care | Payer: Medicare Other | Source: Ambulatory Visit | Attending: Radiation Oncology | Admitting: Radiation Oncology

## 2016-01-05 VITALS — BP 131/61 | HR 83 | Resp 16 | Wt 187.7 lb

## 2016-01-05 DIAGNOSIS — T66XXXA Radiation sickness, unspecified, initial encounter: Secondary | ICD-10-CM

## 2016-01-05 DIAGNOSIS — K208 Other esophagitis: Principal | ICD-10-CM

## 2016-01-05 DIAGNOSIS — C3411 Malignant neoplasm of upper lobe, right bronchus or lung: Secondary | ICD-10-CM | POA: Diagnosis not present

## 2016-01-05 DIAGNOSIS — Z510 Encounter for antineoplastic radiation therapy: Secondary | ICD-10-CM | POA: Diagnosis not present

## 2016-01-05 MED ORDER — SUCRALFATE 1 G PO TABS: ORAL_TABLET | ORAL | Status: DC

## 2016-01-05 NOTE — Progress Notes (Signed)
  Radiation Oncology         [phone]   [name] MRN: [id number]   Date: 01/05/2016  DOB: 10/24/1940     Weekly Radiation Therapy Management    ICD-9-CM ICD-10-CM   1. 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 Primary cancer of right upper lobe of lung (HCC) 162.3 C34.11     Current Dose: 38 Gy  Planned Dose:  66 Gy  Narrative The patient presents for routine under treatment assessment.  Weight and vitals stable. Denies pain. Denies cough. Reports that he had mild discomfort at a 1/10 upon swallowing his orange juice this morning. The RN reinforced previously given education to avoid foods high in acidity. Denies any nausea related to chemotherapy. Denies shortness of breath. Denies fatigue. Reports he played nine holes of golf with his wife. Reports he isn't having to use his inhalers as often. Mild hyperpigmentation without desquamation noted. Reports using radiaplex as directed.  Set-up films were reviewed. The chart was checked.  Physical Findings  weight is 187 lb 11.2 oz (85.14 kg). His blood pressure is 131/61 and his pulse is 83. His respiration is 16 and oxygen saturation is 100%.  Weight essentially stable. This is a pleasant well appearing male in no acute distress. He is alert and oriented.   Impression The patient is tolerating radiation.  Plan Continue treatment as planned. Prescribed Carafate for esophagitis.      Sheral Apley Tammi Klippel, M.D.  This document serves as a record of services personally performed by Shona Simpson, PA and Tyler Pita, MD. It was created on their behalf by Jenell Milliner, a trained medical scribe. The creation of this record is based on the scribe's personal observations and the provider's statements to them. This document has been checked and approved by the attending provider.

## 2016-01-05 NOTE — Progress Notes (Addendum)
Weight and vitals stable. Denies pain. Denies cough. Reports mild discomfort 1 on a scale of 0-10 with swallowing his orange juice this morning. Reinforced education previously given to avoid foods high in acidity. Denies any nausea related to chemotherapy. Denies shortness of breath. Denies fatigue. Reports he played nine holes of golf with his wife. Reports he isn't having to use his inhalers as often. Mild hyperpigmentation without desquamation noted. Reports using radiaplex as directed.   BP 131/61 mmHg  Pulse 83  Resp 16  Wt 187 lb 11.2 oz (85.14 kg)  SpO2 100% Wt Readings from Last 3 Encounters:  01/05/16 187 lb 11.2 oz (85.14 kg)  01/04/16 188 lb (85.276 kg)  01/02/16 184 lb 3.2 oz (83.553 kg)

## 2016-01-06 ENCOUNTER — Ambulatory Visit
Admission: RE | Admit: 2016-01-06 | Discharge: 2016-01-06 | Disposition: A | Discharge status: Home / Self Care | Payer: Medicare Other | Source: Ambulatory Visit | Attending: Radiation Oncology | Admitting: Radiation Oncology

## 2016-01-06 DIAGNOSIS — C3411 Malignant neoplasm of upper lobe, right bronchus or lung: Secondary | ICD-10-CM | POA: Diagnosis not present

## 2016-01-06 DIAGNOSIS — Z510 Encounter for antineoplastic radiation therapy: Secondary | ICD-10-CM | POA: Diagnosis not present

## 2016-01-09 ENCOUNTER — Ambulatory Visit (HOSPITAL_BASED_OUTPATIENT_CLINIC_OR_DEPARTMENT_OTHER): Payer: Medicare Other

## 2016-01-09 ENCOUNTER — Ambulatory Visit
Admission: RE | Admit: 2016-01-09 | Discharge: 2016-01-09 | Disposition: A | Discharge status: Home / Self Care | Payer: Medicare Other | Source: Ambulatory Visit | Attending: Radiation Oncology | Admitting: Radiation Oncology

## 2016-01-09 ENCOUNTER — Other Ambulatory Visit: Payer: Self-pay | Admitting: Medical Oncology

## 2016-01-09 ENCOUNTER — Other Ambulatory Visit (HOSPITAL_BASED_OUTPATIENT_CLINIC_OR_DEPARTMENT_OTHER): Payer: Medicare Other

## 2016-01-09 ENCOUNTER — Other Ambulatory Visit: Payer: Self-pay | Admitting: Family Medicine

## 2016-01-09 VITALS — BP 142/65 | HR 90 | Temp 97.6°F | Resp 18

## 2016-01-09 DIAGNOSIS — C3411 Malignant neoplasm of upper lobe, right bronchus or lung: Secondary | ICD-10-CM

## 2016-01-09 DIAGNOSIS — Z510 Encounter for antineoplastic radiation therapy: Secondary | ICD-10-CM | POA: Diagnosis not present

## 2016-01-09 LAB — CBC WITH DIFFERENTIAL/PLATELET
BASO%: 0.2 % (ref 0.0–2.0)
Basophils Absolute: 0 10*3/uL (ref 0.0–0.1)
EOS ABS: 0 10*3/uL (ref 0.0–0.5)
EOS%: 0.7 % (ref 0.0–7.0)
HEMATOCRIT: 30.8 % — AB (ref 38.4–49.9)
HEMOGLOBIN: 10.3 g/dL — AB (ref 13.0–17.1)
LYMPH#: 0.8 10*3/uL — AB (ref 0.9–3.3)
LYMPH%: 18.7 % (ref 14.0–49.0)
MCH: 28.7 pg (ref 27.2–33.4)
MCHC: 33.4 g/dL (ref 32.0–36.0)
MCV: 85.8 fL (ref 79.3–98.0)
MONO#: 0.2 10*3/uL (ref 0.1–0.9)
MONO%: 4.9 % (ref 0.0–14.0)
NEUT%: 75.5 % — ABNORMAL HIGH (ref 39.0–75.0)
NEUTROS ABS: 3.4 10*3/uL (ref 1.5–6.5)
PLATELETS: 59 10*3/uL — AB (ref 140–400)
RBC: 3.59 10*6/uL — ABNORMAL LOW (ref 4.20–5.82)
RDW: 16.4 % — AB (ref 11.0–14.6)
WBC: 4.5 10*3/uL (ref 4.0–10.3)

## 2016-01-09 LAB — COMPREHENSIVE METABOLIC PANEL
ALBUMIN: 3.5 g/dL (ref 3.5–5.0)
ALK PHOS: 81 U/L (ref 40–150)
ALT: 20 U/L (ref 0–55)
ANION GAP: 7 meq/L (ref 3–11)
AST: 25 U/L (ref 5–34)
BILIRUBIN TOTAL: 0.59 mg/dL (ref 0.20–1.20)
BUN: 18.4 mg/dL (ref 7.0–26.0)
CO2: 28 mEq/L (ref 22–29)
CREATININE: 1.3 mg/dL (ref 0.7–1.3)
Calcium: 9.3 mg/dL (ref 8.4–10.4)
Chloride: 98 mEq/L (ref 98–109)
EGFR: 54 mL/min/{1.73_m2} — ABNORMAL LOW (ref >90)
Glucose: 86 mg/dl (ref 70–140)
Potassium: 4.6 mEq/L (ref 3.5–5.1)
Sodium: 133 mEq/L — ABNORMAL LOW (ref 136–145)
TOTAL PROTEIN: 7.1 g/dL (ref 6.4–8.3)

## 2016-01-09 MED ORDER — SODIUM CHLORIDE 0.9 % IV SOLN
Freq: Once | INTRAVENOUS | Status: AC
  Administered 2016-01-09: 12:00:00 via INTRAVENOUS

## 2016-01-09 MED ORDER — FAMOTIDINE IN NACL 20-0.9 MG/50ML-% IV SOLN
20.0000 mg | Freq: Once | INTRAVENOUS | Status: AC
  Administered 2016-01-09: 20 mg via INTRAVENOUS

## 2016-01-09 MED ORDER — PACLITAXEL CHEMO INJECTION 300 MG/50ML: 45.0000 mg/m2 | Freq: Once | INTRAVENOUS | Status: DC

## 2016-01-09 MED ORDER — PALONOSETRON HCL INJECTION 0.25 MG/5ML
INTRAVENOUS | Status: AC
  Filled 2016-01-09: qty 5

## 2016-01-09 MED ORDER — SODIUM CHLORIDE 0.9 % IV SOLN
20.0000 mg | Freq: Once | INTRAVENOUS | Status: DC
  Filled 2016-01-09: qty 2

## 2016-01-09 MED ORDER — DIPHENHYDRAMINE HCL 50 MG/ML IJ SOLN
INTRAMUSCULAR | Status: AC
  Filled 2016-01-09: qty 1

## 2016-01-09 MED ORDER — PALONOSETRON HCL INJECTION 0.25 MG/5ML
0.2500 mg | Freq: Once | INTRAVENOUS | Status: AC
  Administered 2016-01-09: 0.25 mg via INTRAVENOUS

## 2016-01-09 MED ORDER — FAMOTIDINE IN NACL 20-0.9 MG/50ML-% IV SOLN
INTRAVENOUS | Status: AC
  Filled 2016-01-09: qty 50

## 2016-01-09 MED ORDER — SODIUM CHLORIDE 0.9 % IV SOLN: 163.2000 mg | Freq: Once | INTRAVENOUS | Status: DC

## 2016-01-09 MED ORDER — DIPHENHYDRAMINE HCL 50 MG/ML IJ SOLN
50.0000 mg | Freq: Once | INTRAMUSCULAR | Status: AC
  Administered 2016-01-09: 50 mg via INTRAVENOUS

## 2016-01-09 NOTE — Patient Instructions (Signed)
[facility] Discharge Instructions for Patients Receiving Chemotherapy  Today you received the following chemotherapy agents Taxol/ Carboplatin  To help prevent nausea and vomiting after your treatment, we encourage you to take your nausea medication compazine as directed by your MD.  If you develop nausea and vomiting that is not controlled by your nausea medication, call the clinic.   BELOW ARE SYMPTOMS THAT SHOULD BE REPORTED IMMEDIATELY:  *FEVER GREATER THAN 100.5 F  *CHILLS WITH OR WITHOUT FEVER  NAUSEA AND VOMITING THAT IS NOT CONTROLLED WITH YOUR NAUSEA MEDICATION  *UNUSUAL SHORTNESS OF BREATH  *UNUSUAL BRUISING OR BLEEDING  TENDERNESS IN MOUTH AND THROAT WITH OR WITHOUT PRESENCE OF ULCERS  *URINARY PROBLEMS  *BOWEL PROBLEMS  UNUSUAL RASH Items with * indicate a potential emergency and should be followed up as soon as possible.  Feel free to call the clinic you have any questions or concerns. The clinic phone number is [phone].  Please show the [facility] at check-in to the Emergency Department and triage nurse.   Paclitaxel injection TAXOL What is this medicine? PACLITAXEL (PAK li TAX el) is a chemotherapy drug. It targets fast dividing cells, like cancer cells, and causes these cells to die. This medicine is used to treat ovarian cancer, breast cancer, and other cancers. This medicine may be used for other purposes; ask your health care provider or pharmacist if you have questions. What should I tell my health care provider before I take this medicine? They need to know if you have any of these conditions: -blood disorders -irregular heartbeat -infection (especially a virus infection such as chickenpox, cold sores, or herpes) -liver disease -previous or ongoing radiation therapy -an unusual or allergic reaction to paclitaxel, alcohol, polyoxyethylated castor oil, other chemotherapy agents, other medicines, foods, dyes, or  preservatives -pregnant or trying to get pregnant -breast-feeding How should I use this medicine? This drug is given as an infusion into a vein. It is administered in a hospital or clinic by a specially trained health care professional. Talk to your pediatrician regarding the use of this medicine in children. Special care may be needed. Overdosage: If you think you have taken too much of this medicine contact a poison control center or emergency room at once. NOTE: This medicine is only for you. Do not share this medicine with others. What if I miss a dose? It is important not to miss your dose. Call your doctor or health care professional if you are unable to keep an appointment. What may interact with this medicine? Do not take this medicine with any of the following medications: -disulfiram -metronidazole This medicine may also interact with the following medications: -cyclosporine -diazepam -ketoconazole -medicines to increase blood counts like filgrastim, pegfilgrastim, sargramostim -other chemotherapy drugs like cisplatin, doxorubicin, epirubicin, etoposide, teniposide, vincristine -quinidine -testosterone -vaccines -verapamil Talk to your doctor or health care professional before taking any of these medicines: -acetaminophen -aspirin -ibuprofen -ketoprofen -naproxen This list may not describe all possible interactions. Give your health care provider a list of all the medicines, herbs, non-prescription drugs, or dietary supplements you use. Also tell them if you smoke, drink alcohol, or use illegal drugs. Some items may interact with your medicine. What should I watch for while using this medicine? Your condition will be monitored carefully while you are receiving this medicine. You will need important blood work done while you are taking this medicine. This drug may make you feel generally unwell. This is not uncommon, as chemotherapy can affect  healthy cells as well as cancer  cells. Report any side effects. Continue your course of treatment even though you feel ill unless your doctor tells you to stop. This medicine can cause serious allergic reactions. To reduce your risk you will need to take other medicine(s) before treatment with this medicine. In some cases, you may be given additional medicines to help with side effects. Follow all directions for their use. Call your doctor or health care professional for advice if you get a fever, chills or sore throat, or other symptoms of a cold or flu. Do not treat yourself. This drug decreases your body's ability to fight infections. Try to avoid being around people who are sick. This medicine may increase your risk to bruise or bleed. Call your doctor or health care professional if you notice any unusual bleeding. Be careful brushing and flossing your teeth or using a toothpick because you may get an infection or bleed more easily. If you have any dental work done, tell your dentist you are receiving this medicine. Avoid taking products that contain aspirin, acetaminophen, ibuprofen, naproxen, or ketoprofen unless instructed by your doctor. These medicines may hide a fever. Do not become pregnant while taking this medicine. Women should inform their doctor if they wish to become pregnant or think they might be pregnant. There is a potential for serious side effects to an unborn child. Talk to your health care professional or pharmacist for more information. Do not breast-feed an infant while taking this medicine. Men are advised not to father a child while receiving this medicine. This product may contain alcohol. Ask your pharmacist or healthcare provider if this medicine contains alcohol. Be sure to tell all healthcare providers you are taking this medicine. Certain medicines, like metronidazole and disulfiram, can cause an unpleasant reaction when taken with alcohol. The reaction includes flushing, headache, nausea, vomiting,  sweating, and increased thirst. The reaction can last from 30 minutes to several hours. What side effects may I notice from receiving this medicine? Side effects that you should report to your doctor or health care professional as soon as possible: -allergic reactions like skin rash, itching or hives, swelling of the face, lips, or tongue -low blood counts - This drug may decrease the number of white blood cells, red blood cells and platelets. You may be at increased risk for infections and bleeding. -signs of infection - fever or chills, cough, sore throat, pain or difficulty passing urine -signs of decreased platelets or bleeding - bruising, pinpoint red spots on the skin, black, tarry stools, nosebleeds -signs of decreased red blood cells - unusually weak or tired, fainting spells, lightheadedness -breathing problems -chest pain -high or low blood pressure -mouth sores -nausea and vomiting -pain, swelling, redness or irritation at the injection site -pain, tingling, numbness in the hands or feet -slow or irregular heartbeat -swelling of the ankle, feet, hands Side effects that usually do not require medical attention (report to your doctor or health care professional if they continue or are bothersome): -bone pain -complete hair loss including hair on your head, underarms, pubic hair, eyebrows, and eyelashes -changes in the color of fingernails -diarrhea -loosening of the fingernails -loss of appetite -muscle or joint pain -red flush to skin -sweating This list may not describe all possible side effects. Call your doctor for medical advice about side effects. You may report side effects to FDA at 1-800-FDA-1088. Where should I keep my medicine? This drug is given in a hospital or clinic and will  not be stored at home. NOTE: This sheet is a summary. It may not cover all possible information. If you have questions about this medicine, talk to your doctor, pharmacist, or health care  provider.    2016, [employer] Standard. (2015-03-31 13:02:56)    Carboplatin injection What is this medicine? CARBOPLATIN (KAR boe pla tin) is a chemotherapy drug. It targets fast dividing cells, like cancer cells, and causes these cells to die. This medicine is used to treat ovarian cancer and many other cancers. This medicine may be used for other purposes; ask your health care provider or pharmacist if you have questions. What should I tell my health care provider before I take this medicine? They need to know if you have any of these conditions: -blood disorders -hearing problems -kidney disease -recent or ongoing radiation therapy -an unusual or allergic reaction to carboplatin, cisplatin, other chemotherapy, other medicines, foods, dyes, or preservatives -pregnant or trying to get pregnant -breast-feeding How should I use this medicine? This drug is usually given as an infusion into a vein. It is administered in a hospital or clinic by a specially trained health care professional. Talk to your pediatrician regarding the use of this medicine in children. Special care may be needed. Overdosage: If you think you have taken too much of this medicine contact a poison control center or emergency room at once. NOTE: This medicine is only for you. Do not share this medicine with others. What if I miss a dose? It is important not to miss a dose. Call your doctor or health care professional if you are unable to keep an appointment. What may interact with this medicine? -medicines for seizures -medicines to increase blood counts like filgrastim, pegfilgrastim, sargramostim -some antibiotics like amikacin, gentamicin, neomycin, streptomycin, tobramycin -vaccines Talk to your doctor or health care professional before taking any of these medicines: -acetaminophen -aspirin -ibuprofen -ketoprofen -naproxen This list may not describe all possible interactions. Give your health care  provider a list of all the medicines, herbs, non-prescription drugs, or dietary supplements you use. Also tell them if you smoke, drink alcohol, or use illegal drugs. Some items may interact with your medicine. What should I watch for while using this medicine? Your condition will be monitored carefully while you are receiving this medicine. You will need important blood work done while you are taking this medicine. This drug may make you feel generally unwell. This is not uncommon, as chemotherapy can affect healthy cells as well as cancer cells. Report any side effects. Continue your course of treatment even though you feel ill unless your doctor tells you to stop. In some cases, you may be given additional medicines to help with side effects. Follow all directions for their use. Call your doctor or health care professional for advice if you get a fever, chills or sore throat, or other symptoms of a cold or flu. Do not treat yourself. This drug decreases your body's ability to fight infections. Try to avoid being around people who are sick. This medicine may increase your risk to bruise or bleed. Call your doctor or health care professional if you notice any unusual bleeding. Be careful brushing and flossing your teeth or using a toothpick because you may get an infection or bleed more easily. If you have any dental work done, tell your dentist you are receiving this medicine. Avoid taking products that contain aspirin, acetaminophen, ibuprofen, naproxen, or ketoprofen unless instructed by your doctor. These medicines may hide a fever. Do not  become pregnant while taking this medicine. Women should inform their doctor if they wish to become pregnant or think they might be pregnant. There is a potential for serious side effects to an unborn child. Talk to your health care professional or pharmacist for more information. Do not breast-feed an infant while taking this medicine. What side effects may I  notice from receiving this medicine? Side effects that you should report to your doctor or health care professional as soon as possible: -allergic reactions like skin rash, itching or hives, swelling of the face, lips, or tongue -signs of infection - fever or chills, cough, sore throat, pain or difficulty passing urine -signs of decreased platelets or bleeding - bruising, pinpoint red spots on the skin, black, tarry stools, nosebleeds -signs of decreased red blood cells - unusually weak or tired, fainting spells, lightheadedness -breathing problems -changes in hearing -changes in vision -chest pain -high blood pressure -low blood counts - This drug may decrease the number of white blood cells, red blood cells and platelets. You may be at increased risk for infections and bleeding. -nausea and vomiting -pain, swelling, redness or irritation at the injection site -pain, tingling, numbness in the hands or feet -problems with balance, talking, walking -trouble passing urine or change in the amount of urine Side effects that usually do not require medical attention (report to your doctor or health care professional if they continue or are bothersome): -hair loss -loss of appetite -metallic taste in the mouth or changes in taste This list may not describe all possible side effects. Call your doctor for medical advice about side effects. You may report side effects to FDA at 1-800-FDA-1088. Where should I keep my medicine? This drug is given in a hospital or clinic and will not be stored at home. NOTE: This sheet is a summary. It may not cover all possible information. If you have questions about this medicine, talk to your doctor, pharmacist, or health care provider.    2016, [employer] Standard. (2007-11-18 14:38:05)

## 2016-01-09 NOTE — Progress Notes (Signed)
1250: Pharmacy questioning whether pt should receive treatment secondary to platelets of 59. Pt had already received Benadryl IV, Aloxi IV and pepcid IV. Reviewed CBC results with Dr. Julien Nordmann and due to platelets of 59 treatment to be held today. Dr. Julien Nordmann aware pt received 3 premeds and stated pt to return next week for treatment. Pt updated on plan of care and aware that though he received premeds he will not receive treatment. Pt and wife educated on signs and symptoms to be aware of with low platelet count and verbalized understanding. Pt and wife aware of new plan of care and to call with any questions or concerns.  Vitals updated and stable. Pt to lobby via wheelchair secondary to feeling dizzy from Benadryl. Wife educated to call neighbor if pt seemed unsteady on feet upon return to home. Wife verbalized understanding.

## 2016-01-10 ENCOUNTER — Telehealth: Payer: Self-pay | Admitting: Medical Oncology

## 2016-01-10 ENCOUNTER — Telehealth: Payer: Self-pay | Admitting: Radiation Oncology

## 2016-01-10 ENCOUNTER — Ambulatory Visit
Admission: RE | Admit: 2016-01-10 | Discharge: 2016-01-10 | Disposition: A | Discharge status: Home / Self Care | Payer: Medicare Other | Source: Ambulatory Visit | Attending: Radiation Oncology | Admitting: Radiation Oncology

## 2016-01-10 ENCOUNTER — Telehealth: Payer: Self-pay | Admitting: *Deleted

## 2016-01-10 ENCOUNTER — Other Ambulatory Visit: Payer: Self-pay | Admitting: Radiation Oncology

## 2016-01-10 DIAGNOSIS — D691 Qualitative platelet defects: Secondary | ICD-10-CM

## 2016-01-10 DIAGNOSIS — Z510 Encounter for antineoplastic radiation therapy: Secondary | ICD-10-CM | POA: Diagnosis not present

## 2016-01-10 DIAGNOSIS — C3411 Malignant neoplasm of upper lobe, right bronchus or lung: Secondary | ICD-10-CM | POA: Diagnosis not present

## 2016-01-10 NOTE — Telephone Encounter (Signed)
He is doing good today.  When he got home yesterday he rested "until the benadryl wore off".

## 2016-01-10 NOTE — Telephone Encounter (Signed)
Oncology Nurse Navigator Documentation  Oncology Nurse Navigator Flowsheets 01/10/2016  Navigator Encounter Type Telephone  Telephone Outgoing Call  Treatment Phase Treatment  Barriers/Navigation Needs Education  Education Pain/ Symptom Management  Interventions Education Method  Education Method Verbal  Acuity Level 2  Acuity Level 2 Educational needs  Time Spent with Patient 15   Patient called with c/o constipation.  I notified Dr. Julien Nordmann.  I recommend him to take Senna S and increase fluids.  I asked that he call tomorrow if unable to move bowels. He was thankful for the call.

## 2016-01-10 NOTE — Telephone Encounter (Signed)
Phoned patient asking him to arrive at 1000 for lab draw prior to radiation treatment at 1030. Patient verbalized understanding and confirmed he would be present. Informed Melissa, RT on L2 that platelet count must be determined before patient is treated tomorrow. Melissa verbalized understanding.

## 2016-01-11 ENCOUNTER — Ambulatory Visit
Admission: RE | Admit: 2016-01-11 | Discharge: 2016-01-11 | Disposition: A | Discharge status: Home / Self Care | Payer: Medicare Other | Source: Ambulatory Visit | Attending: Radiation Oncology | Admitting: Radiation Oncology

## 2016-01-11 DIAGNOSIS — C3411 Malignant neoplasm of upper lobe, right bronchus or lung: Secondary | ICD-10-CM | POA: Diagnosis not present

## 2016-01-11 DIAGNOSIS — D691 Qualitative platelet defects: Secondary | ICD-10-CM | POA: Diagnosis not present

## 2016-01-11 DIAGNOSIS — Z510 Encounter for antineoplastic radiation therapy: Secondary | ICD-10-CM | POA: Diagnosis not present

## 2016-01-11 LAB — CBC WITH DIFFERENTIAL/PLATELET
BASO%: 0.3 % (ref 0.0–2.0)
Basophils Absolute: 0 10*3/uL (ref 0.0–0.1)
EOS%: 0.3 % (ref 0.0–7.0)
Eosinophils Absolute: 0 10*3/uL (ref 0.0–0.5)
HCT: 31.3 % — ABNORMAL LOW (ref 38.4–49.9)
HGB: 10 g/dL — ABNORMAL LOW (ref 13.0–17.1)
LYMPH%: 11.9 % — AB (ref 14.0–49.0)
MCH: 27.8 pg (ref 27.2–33.4)
MCHC: 31.9 g/dL — AB (ref 32.0–36.0)
MCV: 87.1 fL (ref 79.3–98.0)
MONO#: 0.4 10*3/uL (ref 0.1–0.9)
MONO%: 8.8 % (ref 0.0–14.0)
NEUT%: 78.7 % — AB (ref 39.0–75.0)
NEUTROS ABS: 3.6 10*3/uL (ref 1.5–6.5)
Platelets: 72 10*3/uL — ABNORMAL LOW (ref 140–400)
RBC: 3.6 10*6/uL — AB (ref 4.20–5.82)
RDW: 17.7 % — ABNORMAL HIGH (ref 11.0–14.6)
WBC: 4.6 10*3/uL (ref 4.0–10.3)
lymph#: 0.5 10*3/uL — ABNORMAL LOW (ref 0.9–3.3)

## 2016-01-11 NOTE — Progress Notes (Signed)
Informed Ciarah, RT on L2 the patient's platelet count is 72 today and safe to treat with radiation therapy today.

## 2016-01-12 ENCOUNTER — Ambulatory Visit
Admission: RE | Admit: 2016-01-12 | Discharge: 2016-01-12 | Disposition: A | Discharge status: Home / Self Care | Payer: Medicare Other | Source: Ambulatory Visit | Attending: Radiation Oncology | Admitting: Radiation Oncology

## 2016-01-12 DIAGNOSIS — C3411 Malignant neoplasm of upper lobe, right bronchus or lung: Secondary | ICD-10-CM | POA: Diagnosis not present

## 2016-01-12 DIAGNOSIS — Z510 Encounter for antineoplastic radiation therapy: Secondary | ICD-10-CM | POA: Diagnosis not present

## 2016-01-13 ENCOUNTER — Ambulatory Visit
Admission: RE | Admit: 2016-01-13 | Discharge: 2016-01-13 | Disposition: A | Discharge status: Home / Self Care | Payer: Medicare Other | Source: Ambulatory Visit | Attending: Radiation Oncology | Admitting: Radiation Oncology

## 2016-01-13 ENCOUNTER — Other Ambulatory Visit: Payer: Self-pay | Admitting: Medical Oncology

## 2016-01-13 ENCOUNTER — Encounter: Payer: Self-pay | Admitting: Radiation Oncology

## 2016-01-13 VITALS — BP 111/60 | HR 88 | Resp 16 | Wt 186.6 lb

## 2016-01-13 DIAGNOSIS — C3411 Malignant neoplasm of upper lobe, right bronchus or lung: Secondary | ICD-10-CM | POA: Diagnosis not present

## 2016-01-13 DIAGNOSIS — Z510 Encounter for antineoplastic radiation therapy: Secondary | ICD-10-CM | POA: Diagnosis not present

## 2016-01-13 NOTE — Progress Notes (Signed)
Weight and vitals stable. Denies pain. Reports faint hyperpigmentation within treatment field without desquamation. Reports using radiaplex bid as directed. Encouraged patient to begin applying  hydrocortisone in addition to radiaplex to manage itchy chest. Reports SOB only with great exertion. Reports playing 9 holes of golf with his wife yesterday and only experiencing mild fatigue. Reports an occasional dry cough. Denies any pain or difficulty associated with swallowing.  BP 111/60 mmHg  Pulse 88  Resp 16  Wt 186 lb 9.6 oz (84.641 kg)  SpO2 98% Wt Readings from Last 3 Encounters:  01/13/16 186 lb 9.6 oz (84.641 kg)  01/05/16 187 lb 11.2 oz (85.14 kg)  01/04/16 188 lb (85.276 kg)

## 2016-01-13 NOTE — Progress Notes (Signed)
  Radiation Oncology         [phone]   [name] MRN: [id number]   Date: 01/13/2016  DOB: Jan 17, 1941     Weekly Radiation Therapy Management    ICD-9-CM ICD-10-CM   1. 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  Weight and vitals stable. Denies pain. Reports faint hyperpigmentation within treatment field without desquamation. Reports using radiaplex bid as directed. Encouraged patient to begin applying hydrocortisone in addition to radiaplex to manage itchy chest. Reports SOB only with great exertion. Reports playing 9 holes of golf with his wife yesterday and only experiencing mild fatigue. Reports an occasional dry cough. Denies any pain or difficulty associated with swallowing.   Set-up films were reviewed. The chart was checked.  Physical Findings  weight is 186 lb 9.6 oz (84.641 kg). His blood pressure is 111/60 and his pulse is 88. His respiration is 16 and oxygen saturation is 98%.  Weight essentially stable. This is a pleasant well appearing male in no acute distress. He is alert and oriented.   Impression The patient is tolerating radiation.  Plan Continue treatment as planned.       Sheral Apley Tammi Klippel, M.D.  This document serves as a record of services personally performed by Tyler Pita, MD. It was created on his behalf by Derek Mound, a trained medical scribe. The creation of this record is based on the scribe's personal observations and the provider's statements to them. This document has been checked and approved by the attending provider. Marland Kitchen

## 2016-01-16 ENCOUNTER — Ambulatory Visit
Admission: RE | Admit: 2016-01-16 | Discharge: 2016-01-16 | Disposition: A | Discharge status: Home / Self Care | Payer: Medicare Other | Source: Ambulatory Visit | Attending: Radiation Oncology | Admitting: Radiation Oncology

## 2016-01-16 ENCOUNTER — Ambulatory Visit (HOSPITAL_BASED_OUTPATIENT_CLINIC_OR_DEPARTMENT_OTHER): Payer: Medicare Other | Admitting: Internal Medicine

## 2016-01-16 ENCOUNTER — Encounter: Payer: Self-pay | Admitting: Internal Medicine

## 2016-01-16 ENCOUNTER — Ambulatory Visit: Payer: Medicare Other

## 2016-01-16 ENCOUNTER — Encounter: Payer: Self-pay | Admitting: *Deleted

## 2016-01-16 ENCOUNTER — Other Ambulatory Visit (HOSPITAL_BASED_OUTPATIENT_CLINIC_OR_DEPARTMENT_OTHER): Payer: Medicare Other

## 2016-01-16 VITALS — BP 124/61 | HR 87 | Temp 97.8°F | Resp 18 | Ht 70.0 in | Wt 185.0 lb

## 2016-01-16 DIAGNOSIS — Z5111 Encounter for antineoplastic chemotherapy: Secondary | ICD-10-CM

## 2016-01-16 DIAGNOSIS — Z510 Encounter for antineoplastic radiation therapy: Secondary | ICD-10-CM | POA: Diagnosis not present

## 2016-01-16 DIAGNOSIS — C3411 Malignant neoplasm of upper lobe, right bronchus or lung: Secondary | ICD-10-CM | POA: Diagnosis not present

## 2016-01-16 DIAGNOSIS — C771 Secondary and unspecified malignant neoplasm of intrathoracic lymph nodes: Secondary | ICD-10-CM

## 2016-01-16 LAB — CBC WITH DIFFERENTIAL/PLATELET
BASO%: 0.7 % (ref 0.0–2.0)
Basophils Absolute: 0 10*3/uL (ref 0.0–0.1)
EOS ABS: 0 10*3/uL (ref 0.0–0.5)
EOS%: 0.8 % (ref 0.0–7.0)
HCT: 32 % — ABNORMAL LOW (ref 38.4–49.9)
HEMOGLOBIN: 10.5 g/dL — AB (ref 13.0–17.1)
LYMPH#: 0.5 10*3/uL — AB (ref 0.9–3.3)
LYMPH%: 21.7 % (ref 14.0–49.0)
MCH: 28.7 pg (ref 27.2–33.4)
MCHC: 32.8 g/dL (ref 32.0–36.0)
MCV: 87.6 fL (ref 79.3–98.0)
MONO#: 0.5 10*3/uL (ref 0.1–0.9)
MONO%: 22.8 % — ABNORMAL HIGH (ref 0.0–14.0)
NEUT%: 54 % (ref 39.0–75.0)
NEUTROS ABS: 1.3 10*3/uL — AB (ref 1.5–6.5)
PLATELETS: 77 10*3/uL — AB (ref 140–400)
RBC: 3.65 10*6/uL — ABNORMAL LOW (ref 4.20–5.82)
RDW: 19.5 % — AB (ref 11.0–14.6)
WBC: 2.4 10*3/uL — AB (ref 4.0–10.3)

## 2016-01-16 LAB — COMPREHENSIVE METABOLIC PANEL
ALBUMIN: 3.5 g/dL (ref 3.5–5.0)
ALK PHOS: 82 U/L (ref 40–150)
ALT: 16 U/L (ref 0–55)
ANION GAP: 7 meq/L (ref 3–11)
AST: 22 U/L (ref 5–34)
BILIRUBIN TOTAL: 0.49 mg/dL (ref 0.20–1.20)
BUN: 14.3 mg/dL (ref 7.0–26.0)
CO2: 27 mEq/L (ref 22–29)
CREATININE: 1.3 mg/dL (ref 0.7–1.3)
Calcium: 9.2 mg/dL (ref 8.4–10.4)
Chloride: 101 mEq/L (ref 98–109)
EGFR: 56 mL/min/{1.73_m2} — AB (ref >90)
GLUCOSE: 90 mg/dL (ref 70–140)
Potassium: 4.5 mEq/L (ref 3.5–5.1)
Sodium: 135 mEq/L — ABNORMAL LOW (ref 136–145)
TOTAL PROTEIN: 6.9 g/dL (ref 6.4–8.3)

## 2016-01-16 NOTE — Progress Notes (Signed)
[place] [phone]   [phone]  OFFICE PROGRESS NOTE  [name], MD [place] [address]  DIAGNOSIS: Stage IIIA (T2b, N2, M0) non-small cell lung cancer, favoring squamous cell carcinoma presented with right upper lobe lung mass in addition to mediastinal lymphadenopathy diagnosed in March 2017.  PRIOR THERAPY: None.  CURRENT THERAPY: A course of concurrent chemoradiation with weekly carboplatin for AUC of 2 and paclitaxel 45 MG/M2. Status post 4 cycle.  INTERVAL HISTORY: Paul Sandoval 75 y.o. male returns to the clinic today for follow-up visit. The patient is feeling fine today with no specific complaints. He is tolerating his treatment with concurrent chemoradiation fairly well with no significant adverse effects. He denied having any significant nausea or vomiting. He has no fever or chills. The patient denied having any significant chest pain, shortness of breath, cough or hemoptysis. He has no dysphagia or odynophagia. He is here today for evaluation before starting cycle #5.  MEDICAL HISTORY: Past Medical History  Diagnosis Date  . 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 AAA (abdominal aortic aneurysm) ([place]) 2010    4.4 cm 08/2008;4.44 in 7/10 and 4.65 in 08/2009; 4.8 by CT in 11/2009; 4.3 by ultrasound in 08/2010  . Hypertension   . Hyperlipidemia   . Tobacco abuse, in remission     40 pack year total consumption; discontinued in 1996  . GERD (gastroesophageal reflux disease)   . Right bundle branch block   . Diverticulosis   . Colonic polyp 2002    polypectomy in 2002  . COPD (chronic obstructive pulmonary disease) ([place])   . CAD (coronary artery disease)     03/18/14:  PCI with DES to distal left main. 7/29: DES to the SVG to Diag  . ED (erectile dysfunction)   . IFG (impaired fasting glucose)   . Chronic rhinitis   . Myocardial infarction [facility])     "told h/o silent  MI sometime before 1996"  . Pneumonia ~ 2001; ~ 2005  . Chronic bronchitis ([place])   . Arthritis     "fingers" (03/18/2014)  . Cardiomyopathy, ischemic     Echo 03/17/14: EF 45-50%  . Encounter for antineoplastic chemotherapy 12/19/2015    ALLERGIES:  is allergic to neomycin.  MEDICATIONS:  Current Outpatient Prescriptions  Medication Sig Dispense Refill  . acetaminophen (TYLENOL) 500 MG tablet Take 500 mg by mouth every 6 (six) hours as needed for mild pain.    Marland Kitchen Clopidogrel Bisulfate (PLAVIX PO) Take 75 mg by mouth daily. Reported on 12/02/2015    . enalapril (VASOTEC) 10 MG tablet Take one and a half tablets PO BID 90 tablet 5  . Ferrous Sulfate (IRON) 28 MG TABS Take 28 mg by mouth daily.    . hydrochlorothiazide (HYDRODIURIL) 25 MG tablet Take 0.5 tablets (12.5 mg total) by mouth daily.    . metoprolol (LOPRESSOR) 50 MG tablet Take 50 mg by mouth 2 (two) times daily.     . Multiple Vitamins-Minerals (CENTRUM SILVER ADULT 50+) TABS Take 1 tablet by mouth daily.     . nitroGLYCERIN (NITROSTAT) 0.4 MG SL tablet Place 1 tablet (0.4 mg total) under the tongue every 5 (five) minutes as needed for chest pain. 25 tablet 4  . omeprazole (PRILOSEC) 20 MG capsule TAKE ONE CAPSULE BY MOUTH DAILY. 90 capsule 1  . prochlorperazine (COMPAZINE) 10 MG tablet Take 1 tablet (10 mg total) by mouth every  6 (six) hours as needed for nausea or vomiting. 30 tablet 0  . simvastatin (ZOCOR) 80 MG tablet Take 0.5 tablets (40 mg total) by mouth at bedtime. 15 tablet 5  . sucralfate (CARAFATE) 1 g tablet Dissolve in 8 oz water and drink 5 minutes prior to meals and at bedtime 120 tablet 0  . VENTOLIN HFA 108 (90 Base) MCG/ACT inhaler INHALE 2 PUFFS BY MOUTH EVERY 4 TO 6 HOURS AS NEEDED FOR WHEEZING. 18 g 5  . Wound Cleansers (RADIAPLEX EX) Apply topically.     No current facility-administered medications for this visit.    SURGICAL HISTORY:  Past Surgical History  Procedure Laterality Date  . Colonoscopy  2002      polypectomy-patient denies  . Laparoscopic cholecystectomy  12/2009  . Abdominal aortic endovascular stent graft N/A 12/11/2012    Procedure: ABDOMINAL AORTIC ENDOVASCULAR STENT GRAFT;  Surgeon: Mal Misty, MD;  Location: [facility];  Service: Vascular;  Laterality: N/A;  Ultrasound guided; [place]  . Abdominal aortic aneurysm repair  11/2012  . Total hip arthroplasty Left 01/21/2013    Procedure: TOTAL HIP ARTHROPLASTY ANTERIOR APPROACH;  Surgeon: Mauri Pole, MD;  Location: [facility];  Service: Orthopedics;  Laterality: Left;  . Joint replacement    . Coronary artery bypass graft  01/09/1995    "CABG X3"  . Cardiac catheterization  01/08/1995  . Coronary angioplasty with stent placement  03/18/2014    "1"  . Coronary angioplasty with stent placement  03/24/2014    "1"  . Left and right heart catheterization with coronary/graft angiogram N/A 03/18/2014    Procedure: LEFT AND RIGHT HEART CATHETERIZATION WITH Beatrix Fetters;  Surgeon: Blane Ohara, MD;  Location: [facility] CATH LAB;  Service: Cardiovascular;  Laterality: N/A;  . Percutaneous coronary stent intervention (pci-s)  03/18/2014    Procedure: PERCUTANEOUS CORONARY STENT INTERVENTION (PCI-S);  Surgeon: Blane Ohara, MD;  Location: [facility] CATH LAB;  Service: Cardiovascular;;  . Percutaneous coronary stent intervention (pci-s) N/A 03/24/2014    Procedure: PERCUTANEOUS CORONARY STENT INTERVENTION (PCI-S);  Surgeon: Blane Ohara, MD;  Location: [facility] CATH LAB;  Service: Cardiovascular;  Laterality: N/A;  . Video bronchoscopy N/A 11/17/2015    Procedure: VIDEO BRONCHOSCOPY WITH FLUORO;  Surgeon: Rigoberto Noel, MD;  Location: [place];  Service: Cardiopulmonary;  Laterality: N/A;    REVIEW OF SYSTEMS:  A comprehensive review of systems was negative.   PHYSICAL EXAMINATION: General appearance: alert, cooperative and no distress Head: Normocephalic, without obvious abnormality, atraumatic Neck: no adenopathy, no JVD, supple, symmetrical,  trachea midline and thyroid not enlarged, symmetric, no tenderness/mass/nodules Lymph nodes: Cervical, supraclavicular, and axillary nodes normal. Resp: clear to auscultation bilaterally Back: symmetric, no curvature. ROM normal. No CVA tenderness. Cardio: regular rate and rhythm, S1, S2 normal, no murmur, click, rub or gallop GI: soft, non-tender; bowel sounds normal; no masses,  no organomegaly Extremities: extremities normal, atraumatic, no cyanosis or edema  ECOG PERFORMANCE STATUS: 1 - Symptomatic but completely ambulatory  Blood pressure 124/61, pulse 87, temperature 97.8 F (36.6 C), temperature source Oral, resp. rate 18, height '5\' 10"'$  (1.778 m), weight 185 lb (83.915 kg), SpO2 99 %.  LABORATORY DATA: Lab Results  Component Value Date   WBC 2.4* 01/16/2016   HGB 10.5* 01/16/2016   HCT 32.0* 01/16/2016   MCV 87.6 01/16/2016   PLT 77* 01/16/2016      Chemistry      Component Value Date/Time   NA 135* 01/16/2016 0937   NA  137 11/12/2015 0357   NA 140 08/23/2015 0853   K 4.5 01/16/2016 0937   K 4.3 11/12/2015 0357   CL 100* 11/12/2015 0357   CO2 27 01/16/2016 0937   CO2 27 11/12/2015 0357   BUN 14.3 01/16/2016 0937   BUN 21* 11/12/2015 0357   BUN 16 08/23/2015 0853   CREATININE 1.3 01/16/2016 0937   CREATININE 1.34* 11/12/2015 0357   CREATININE 1.63* 08/18/2014 0758      Component Value Date/Time   CALCIUM 9.2 01/16/2016 0937   CALCIUM 8.9 11/12/2015 0357   ALKPHOS 82 01/16/2016 0937   ALKPHOS 79 11/11/2015 2019   AST 22 01/16/2016 0937   AST 22 11/11/2015 2019   ALT 16 01/16/2016 0937   ALT 16* 11/11/2015 2019   BILITOT 0.49 01/16/2016 0937   BILITOT 0.5 11/11/2015 2019   BILITOT 0.6 08/23/2015 0853       RADIOGRAPHIC STUDIES: No results found.  ASSESSMENT AND PLAN: This is a very pleasant 75 years old white male recently diagnosed with a stage IIIA non-small cell lung cancer and he is currently undergoing a course of concurrent chemoradiation with  weekly carboplatin and paclitaxel status post 4 cycles. He tolerated the first cycle of his treatment well with no significant adverse effects. His platelets count are low today. I recommended for the patient to delay cycle #5 by 1 week. He will continue with the radiotherapy as scheduled. He would come back for follow-up visit in 2 weeks for evaluation and management of any adverse effect of his treatment. The patient was advised to call immediately if he has any concerning symptoms in the interval. The patient voices understanding of current disease status and treatment options and is in agreement with the current care plan.  All questions were answered. The patient knows to call the clinic with any problems, questions or concerns. We can certainly see the patient much sooner if necessary.  Disclaimer: This note was dictated with voice recognition software. Similar sounding words can inadvertently be transcribed and may not be corrected upon review.

## 2016-01-16 NOTE — Progress Notes (Signed)
Oncology Nurse Navigator Documentation  Oncology Nurse Navigator Flowsheets 01/16/2016  Navigator Location [facility]-Med Onc  Navigator Encounter Type Clinic/MDC  Patient Visit Type MedOnc  Treatment Phase Treatment  Barriers/Navigation Needs Education  Education Other  Interventions Education Method  Education Method Verbal  Acuity Level 1  Acuity Level 1 Minimal follow up required  Time Spent with Patient 62   Spoke with patient today.  He is unable to get his chemo today.  He had questions.  I helped to clarify and offered support.  I asked that he call me if needed.  He stated he would.

## 2016-01-17 ENCOUNTER — Ambulatory Visit
Admission: RE | Admit: 2016-01-17 | Discharge: 2016-01-17 | Disposition: A | Discharge status: Home / Self Care | Payer: Medicare Other | Source: Ambulatory Visit | Attending: Radiation Oncology | Admitting: Radiation Oncology

## 2016-01-17 DIAGNOSIS — Z510 Encounter for antineoplastic radiation therapy: Secondary | ICD-10-CM | POA: Diagnosis not present

## 2016-01-17 DIAGNOSIS — C3411 Malignant neoplasm of upper lobe, right bronchus or lung: Secondary | ICD-10-CM | POA: Diagnosis not present

## 2016-01-18 ENCOUNTER — Ambulatory Visit
Admission: RE | Admit: 2016-01-18 | Discharge: 2016-01-18 | Disposition: A | Discharge status: Home / Self Care | Payer: Medicare Other | Source: Ambulatory Visit | Attending: Radiation Oncology | Admitting: Radiation Oncology

## 2016-01-18 DIAGNOSIS — Z510 Encounter for antineoplastic radiation therapy: Secondary | ICD-10-CM | POA: Diagnosis not present

## 2016-01-18 DIAGNOSIS — C3411 Malignant neoplasm of upper lobe, right bronchus or lung: Secondary | ICD-10-CM | POA: Diagnosis not present

## 2016-01-19 ENCOUNTER — Ambulatory Visit
Admission: RE | Admit: 2016-01-19 | Discharge: 2016-01-19 | Disposition: A | Discharge status: Home / Self Care | Payer: Medicare Other | Source: Ambulatory Visit | Attending: Radiation Oncology | Admitting: Radiation Oncology

## 2016-01-19 DIAGNOSIS — Z510 Encounter for antineoplastic radiation therapy: Secondary | ICD-10-CM | POA: Diagnosis not present

## 2016-01-19 DIAGNOSIS — C3411 Malignant neoplasm of upper lobe, right bronchus or lung: Secondary | ICD-10-CM | POA: Diagnosis not present

## 2016-01-20 ENCOUNTER — Ambulatory Visit
Admission: RE | Admit: 2016-01-20 | Discharge: 2016-01-20 | Disposition: A | Discharge status: Home / Self Care | Payer: Medicare Other | Source: Ambulatory Visit | Attending: Radiation Oncology | Admitting: Radiation Oncology

## 2016-01-20 ENCOUNTER — Encounter: Payer: Self-pay | Admitting: Radiation Oncology

## 2016-01-20 VITALS — BP 123/62 | HR 84 | Resp 16 | Wt 185.4 lb

## 2016-01-20 DIAGNOSIS — Z510 Encounter for antineoplastic radiation therapy: Secondary | ICD-10-CM | POA: Diagnosis not present

## 2016-01-20 DIAGNOSIS — Z923 Personal history of irradiation: Secondary | ICD-10-CM | POA: Insufficient documentation

## 2016-01-20 DIAGNOSIS — C3411 Malignant neoplasm of upper lobe, right bronchus or lung: Secondary | ICD-10-CM

## 2016-01-20 MED ORDER — RADIAPLEXRX EX GEL
Freq: Once | CUTANEOUS | Status: AC
  Administered 2016-01-20: 14:00:00 via TOPICAL

## 2016-01-20 NOTE — Progress Notes (Signed)
  Radiation Oncology         [phone]   [name] MRN: [id number]   Date: 01/20/2016  DOB: 05-12-1941     Weekly Radiation Therapy Management    ICD-9-CM ICD-10-CM   1. Primary cancer of right upper lobe of lung (HCC) 162.3 C34.11 hyaluronate sodium (RADIAPLEXRX) gel    Current Dose: 60 Gy  Planned Dose:  66 Gy  Narrative The patient presents for routine under treatment assessment.  Weight and vitals stable. Denies pain. Mild hyperpigmentation within treatment field. Small area of desquamation center of chest. Patient understands to continue radiaplex bid as directed plus apply small amount of neosporin to open area center of chest. Reports SOB with great exertion. Reports an occasional dry cough. Denies pain or difficulty associated with swallowing. Platelets 77 four days ago thus chemo was held again this week. Lab work scheduled for Tuesday 5/30.  Set-up films were reviewed. The chart was checked.  Physical Findings  weight is 185 lb 6.4 oz (84.097 kg). His blood pressure is 123/62 and his pulse is 84. His respiration is 16 and oxygen saturation is 99%.  Weight essentially stable. This is a pleasant well appearing male in no acute distress. He is alert and oriented.   Impression The patient is tolerating radiation.  Plan Continue treatment as planned. He finishes treatment next week on Thursday. I will follow up with him in one month.       Sheral Apley Tammi Klippel, M.D.    This document serves as a record of services personally performed by Tyler Pita, MD. It was created on his behalf by Lendon Collar, a trained medical scribe. The creation of this record is based on the scribe's personal observations and the provider's statements to them. This document has been checked and approved by the attending provider.

## 2016-01-20 NOTE — Progress Notes (Addendum)
Weight and vitals stable. Denies pain. Mild hyperpigmentation within treatment field. Small area of desquamation center of chest. Patient understands to continue radiaplex bid as directed plus apply small amount of neosporin to open area center of chest. Reports SOB with great exertion. Reports an occasional dry cough. Denies pain or difficulty associated with swallowing. Platelets 77 four days ago thus chemo was held again this week. Lab work scheduled for Tuesday 5/30.  BP 123/62 mmHg  Pulse 84  Resp 16  Wt 185 lb 6.4 oz (84.097 kg)  SpO2 99% Wt Readings from Last 3 Encounters:  01/20/16 185 lb 6.4 oz (84.097 kg)  01/16/16 185 lb (83.915 kg)  01/13/16 186 lb 9.6 oz (84.641 kg)

## 2016-01-24 ENCOUNTER — Encounter: Payer: Self-pay | Admitting: *Deleted

## 2016-01-24 ENCOUNTER — Other Ambulatory Visit (HOSPITAL_BASED_OUTPATIENT_CLINIC_OR_DEPARTMENT_OTHER): Payer: Medicare Other

## 2016-01-24 ENCOUNTER — Encounter: Payer: Self-pay | Admitting: Vascular Surgery

## 2016-01-24 ENCOUNTER — Other Ambulatory Visit: Payer: Medicare Other

## 2016-01-24 ENCOUNTER — Ambulatory Visit
Admission: RE | Admit: 2016-01-24 | Discharge: 2016-01-24 | Disposition: A | Discharge status: Home / Self Care | Payer: Medicare Other | Source: Ambulatory Visit | Attending: Radiation Oncology | Admitting: Radiation Oncology

## 2016-01-24 ENCOUNTER — Ambulatory Visit (HOSPITAL_BASED_OUTPATIENT_CLINIC_OR_DEPARTMENT_OTHER): Payer: Medicare Other

## 2016-01-24 VITALS — BP 127/74 | HR 86 | Temp 97.5°F | Resp 18

## 2016-01-24 DIAGNOSIS — C3411 Malignant neoplasm of upper lobe, right bronchus or lung: Secondary | ICD-10-CM

## 2016-01-24 DIAGNOSIS — R918 Other nonspecific abnormal finding of lung field: Secondary | ICD-10-CM

## 2016-01-24 DIAGNOSIS — Z5111 Encounter for antineoplastic chemotherapy: Secondary | ICD-10-CM | POA: Diagnosis not present

## 2016-01-24 DIAGNOSIS — Z510 Encounter for antineoplastic radiation therapy: Secondary | ICD-10-CM | POA: Diagnosis not present

## 2016-01-24 LAB — COMPREHENSIVE METABOLIC PANEL
ALBUMIN: 3.6 g/dL (ref 3.5–5.0)
ALT: 16 U/L (ref 0–55)
ANION GAP: 8 meq/L (ref 3–11)
AST: 24 U/L (ref 5–34)
Alkaline Phosphatase: 83 U/L (ref 40–150)
BILIRUBIN TOTAL: 0.61 mg/dL (ref 0.20–1.20)
BUN: 14 mg/dL (ref 7.0–26.0)
CALCIUM: 9.3 mg/dL (ref 8.4–10.4)
CO2: 28 mEq/L (ref 22–29)
CREATININE: 1.3 mg/dL (ref 0.7–1.3)
Chloride: 98 mEq/L (ref 98–109)
EGFR: 52 mL/min/{1.73_m2} — ABNORMAL LOW (ref >90)
Glucose: 88 mg/dl (ref 70–140)
Potassium: 4.5 mEq/L (ref 3.5–5.1)
Sodium: 133 mEq/L — ABNORMAL LOW (ref 136–145)
TOTAL PROTEIN: 7.2 g/dL (ref 6.4–8.3)

## 2016-01-24 LAB — CBC WITH DIFFERENTIAL/PLATELET
BASO%: 0.4 % (ref 0.0–2.0)
Basophils Absolute: 0 10*3/uL (ref 0.0–0.1)
EOS%: 1 % (ref 0.0–7.0)
Eosinophils Absolute: 0.1 10*3/uL (ref 0.0–0.5)
HEMATOCRIT: 32.8 % — AB (ref 38.4–49.9)
HEMOGLOBIN: 10.9 g/dL — AB (ref 13.0–17.1)
LYMPH#: 0.7 10*3/uL — AB (ref 0.9–3.3)
LYMPH%: 14.8 % (ref 14.0–49.0)
MCH: 29.6 pg (ref 27.2–33.4)
MCHC: 33.2 g/dL (ref 32.0–36.0)
MCV: 89.1 fL (ref 79.3–98.0)
MONO#: 0.8 10*3/uL (ref 0.1–0.9)
MONO%: 17.5 % — ABNORMAL HIGH (ref 0.0–14.0)
NEUT%: 66.3 % (ref 39.0–75.0)
NEUTROS ABS: 3.2 10*3/uL (ref 1.5–6.5)
PLATELETS: 97 10*3/uL — AB (ref 140–400)
RBC: 3.68 10*6/uL — ABNORMAL LOW (ref 4.20–5.82)
RDW: 19.3 % — ABNORMAL HIGH (ref 11.0–14.6)
WBC: 4.8 10*3/uL (ref 4.0–10.3)

## 2016-01-24 MED ORDER — PALONOSETRON HCL INJECTION 0.25 MG/5ML
0.2500 mg | Freq: Once | INTRAVENOUS | Status: AC
  Administered 2016-01-24: 0.25 mg via INTRAVENOUS

## 2016-01-24 MED ORDER — SODIUM CHLORIDE 0.9 % IV SOLN
Freq: Once | INTRAVENOUS | Status: AC
  Administered 2016-01-24: 12:00:00 via INTRAVENOUS

## 2016-01-24 MED ORDER — SODIUM CHLORIDE 0.9% FLUSH
10.0000 mL | INTRAVENOUS | Status: DC | PRN
  Filled 2016-01-24: qty 10

## 2016-01-24 MED ORDER — SODIUM CHLORIDE 0.9 % IV SOLN
45.0000 mg/m2 | Freq: Once | INTRAVENOUS | Status: AC
  Administered 2016-01-24: 90 mg via INTRAVENOUS
  Filled 2016-01-24: qty 15

## 2016-01-24 MED ORDER — FAMOTIDINE IN NACL 20-0.9 MG/50ML-% IV SOLN
INTRAVENOUS | Status: AC
  Filled 2016-01-24: qty 50

## 2016-01-24 MED ORDER — DIPHENHYDRAMINE HCL 50 MG/ML IJ SOLN
INTRAMUSCULAR | Status: AC
  Filled 2016-01-24: qty 1

## 2016-01-24 MED ORDER — FAMOTIDINE IN NACL 20-0.9 MG/50ML-% IV SOLN
20.0000 mg | Freq: Once | INTRAVENOUS | Status: AC
  Administered 2016-01-24: 20 mg via INTRAVENOUS

## 2016-01-24 MED ORDER — HEPARIN SOD (PORK) LOCK FLUSH 100 UNIT/ML IV SOLN
500.0000 [IU] | Freq: Once | INTRAVENOUS | Status: DC | PRN
  Filled 2016-01-24: qty 5

## 2016-01-24 MED ORDER — DEXAMETHASONE SODIUM PHOSPHATE 100 MG/10ML IJ SOLN
20.0000 mg | Freq: Once | INTRAMUSCULAR | Status: AC
  Administered 2016-01-24: 20 mg via INTRAVENOUS
  Filled 2016-01-24: qty 2

## 2016-01-24 MED ORDER — SODIUM CHLORIDE 0.9 % IV SOLN
160.0000 mg | Freq: Once | INTRAVENOUS | Status: AC
  Administered 2016-01-24: 160 mg via INTRAVENOUS
  Filled 2016-01-24: qty 16

## 2016-01-24 MED ORDER — DIPHENHYDRAMINE HCL 50 MG/ML IJ SOLN
50.0000 mg | Freq: Once | INTRAMUSCULAR | Status: AC
  Administered 2016-01-24: 50 mg via INTRAVENOUS

## 2016-01-24 NOTE — Progress Notes (Signed)
Okay to treat pt with platelets 97 today per Dr. Julien Nordmann.

## 2016-01-24 NOTE — Patient Instructions (Signed)
[facility] Discharge Instructions for Patients Receiving Chemotherapy  Today you received the following chemotherapy agents Taxol/ Carboplatin  To help prevent nausea and vomiting after your treatment, we encourage you to take your nausea medication compazine as directed by your MD.  If you develop nausea and vomiting that is not controlled by your nausea medication, call the clinic.   BELOW ARE SYMPTOMS THAT SHOULD BE REPORTED IMMEDIATELY:  *FEVER GREATER THAN 100.5 F  *CHILLS WITH OR WITHOUT FEVER  NAUSEA AND VOMITING THAT IS NOT CONTROLLED WITH YOUR NAUSEA MEDICATION  *UNUSUAL SHORTNESS OF BREATH  *UNUSUAL BRUISING OR BLEEDING  TENDERNESS IN MOUTH AND THROAT WITH OR WITHOUT PRESENCE OF ULCERS  *URINARY PROBLEMS  *BOWEL PROBLEMS  UNUSUAL RASH Items with * indicate a potential emergency and should be followed up as soon as possible.  Feel free to call the clinic you have any questions or concerns. The clinic phone number is [phone].  Please show the [place] at check-in to the Emergency Department and triage nurse.   Paclitaxel injection TAXOL What is this medicine? PACLITAXEL (PAK li TAX el) is a chemotherapy drug. It targets fast dividing cells, like cancer cells, and causes these cells to die. This medicine is used to treat ovarian cancer, breast cancer, and other cancers. This medicine may be used for other purposes; ask your health care provider or pharmacist if you have questions. What should I tell my health care provider before I take this medicine? They need to know if you have any of these conditions: -blood disorders -irregular heartbeat -infection (especially a virus infection such as chickenpox, cold sores, or herpes) -liver disease -previous or ongoing radiation therapy -an unusual or allergic reaction to paclitaxel, alcohol, polyoxyethylated castor oil, other chemotherapy agents, other medicines, foods, dyes, or  preservatives -pregnant or trying to get pregnant -breast-feeding How should I use this medicine? This drug is given as an infusion into a vein. It is administered in a hospital or clinic by a specially trained health care professional. Talk to your pediatrician regarding the use of this medicine in children. Special care may be needed. Overdosage: If you think you have taken too much of this medicine contact a poison control center or emergency room at once. NOTE: This medicine is only for you. Do not share this medicine with others. What if I miss a dose? It is important not to miss your dose. Call your doctor or health care professional if you are unable to keep an appointment. What may interact with this medicine? Do not take this medicine with any of the following medications: -disulfiram -metronidazole This medicine may also interact with the following medications: -cyclosporine -diazepam -ketoconazole -medicines to increase blood counts like filgrastim, pegfilgrastim, sargramostim -other chemotherapy drugs like cisplatin, doxorubicin, epirubicin, etoposide, teniposide, vincristine -quinidine -testosterone -vaccines -verapamil Talk to your doctor or health care professional before taking any of these medicines: -acetaminophen -aspirin -ibuprofen -ketoprofen -naproxen This list may not describe all possible interactions. Give your health care provider a list of all the medicines, herbs, non-prescription drugs, or dietary supplements you use. Also tell them if you smoke, drink alcohol, or use illegal drugs. Some items may interact with your medicine. What should I watch for while using this medicine? Your condition will be monitored carefully while you are receiving this medicine. You will need important blood work done while you are taking this medicine. This drug may make you feel generally unwell. This is not uncommon, as chemotherapy can affect  healthy cells as well as cancer  cells. Report any side effects. Continue your course of treatment even though you feel ill unless your doctor tells you to stop. This medicine can cause serious allergic reactions. To reduce your risk you will need to take other medicine(s) before treatment with this medicine. In some cases, you may be given additional medicines to help with side effects. Follow all directions for their use. Call your doctor or health care professional for advice if you get a fever, chills or sore throat, or other symptoms of a cold or flu. Do not treat yourself. This drug decreases your body's ability to fight infections. Try to avoid being around people who are sick. This medicine may increase your risk to bruise or bleed. Call your doctor or health care professional if you notice any unusual bleeding. Be careful brushing and flossing your teeth or using a toothpick because you may get an infection or bleed more easily. If you have any dental work done, tell your dentist you are receiving this medicine. Avoid taking products that contain aspirin, acetaminophen, ibuprofen, naproxen, or ketoprofen unless instructed by your doctor. These medicines may hide a fever. Do not become pregnant while taking this medicine. Women should inform their doctor if they wish to become pregnant or think they might be pregnant. There is a potential for serious side effects to an unborn child. Talk to your health care professional or pharmacist for more information. Do not breast-feed an infant while taking this medicine. Men are advised not to father a child while receiving this medicine. This product may contain alcohol. Ask your pharmacist or healthcare provider if this medicine contains alcohol. Be sure to tell all healthcare providers you are taking this medicine. Certain medicines, like metronidazole and disulfiram, can cause an unpleasant reaction when taken with alcohol. The reaction includes flushing, headache, nausea, vomiting,  sweating, and increased thirst. The reaction can last from 30 minutes to several hours. What side effects may I notice from receiving this medicine? Side effects that you should report to your doctor or health care professional as soon as possible: -allergic reactions like skin rash, itching or hives, swelling of the face, lips, or tongue -low blood counts - This drug may decrease the number of white blood cells, red blood cells and platelets. You may be at increased risk for infections and bleeding. -signs of infection - fever or chills, cough, sore throat, pain or difficulty passing urine -signs of decreased platelets or bleeding - bruising, pinpoint red spots on the skin, black, tarry stools, nosebleeds -signs of decreased red blood cells - unusually weak or tired, fainting spells, lightheadedness -breathing problems -chest pain -high or low blood pressure -mouth sores -nausea and vomiting -pain, swelling, redness or irritation at the injection site -pain, tingling, numbness in the hands or feet -slow or irregular heartbeat -swelling of the ankle, feet, hands Side effects that usually do not require medical attention (report to your doctor or health care professional if they continue or are bothersome): -bone pain -complete hair loss including hair on your head, underarms, pubic hair, eyebrows, and eyelashes -changes in the color of fingernails -diarrhea -loosening of the fingernails -loss of appetite -muscle or joint pain -red flush to skin -sweating This list may not describe all possible side effects. Call your doctor for medical advice about side effects. You may report side effects to FDA at 1-800-FDA-1088. Where should I keep my medicine? This drug is given in a hospital or clinic and will  not be stored at home. NOTE: This sheet is a summary. It may not cover all possible information. If you have questions about this medicine, talk to your doctor, pharmacist, or health care  provider.    2016, Elsevier/Gold Standard. (2015-03-31 13:02:56)    Carboplatin injection What is this medicine? CARBOPLATIN (KAR boe pla tin) is a chemotherapy drug. It targets fast dividing cells, like cancer cells, and causes these cells to die. This medicine is used to treat ovarian cancer and many other cancers. This medicine may be used for other purposes; ask your health care provider or pharmacist if you have questions. What should I tell my health care provider before I take this medicine? They need to know if you have any of these conditions: -blood disorders -hearing problems -kidney disease -recent or ongoing radiation therapy -an unusual or allergic reaction to carboplatin, cisplatin, other chemotherapy, other medicines, foods, dyes, or preservatives -pregnant or trying to get pregnant -breast-feeding How should I use this medicine? This drug is usually given as an infusion into a vein. It is administered in a hospital or clinic by a specially trained health care professional. Talk to your pediatrician regarding the use of this medicine in children. Special care may be needed. Overdosage: If you think you have taken too much of this medicine contact a poison control center or emergency room at once. NOTE: This medicine is only for you. Do not share this medicine with others. What if I miss a dose? It is important not to miss a dose. Call your doctor or health care professional if you are unable to keep an appointment. What may interact with this medicine? -medicines for seizures -medicines to increase blood counts like filgrastim, pegfilgrastim, sargramostim -some antibiotics like amikacin, gentamicin, neomycin, streptomycin, tobramycin -vaccines Talk to your doctor or health care professional before taking any of these medicines: -acetaminophen -aspirin -ibuprofen -ketoprofen -naproxen This list may not describe all possible interactions. Give your health care  provider a list of all the medicines, herbs, non-prescription drugs, or dietary supplements you use. Also tell them if you smoke, drink alcohol, or use illegal drugs. Some items may interact with your medicine. What should I watch for while using this medicine? Your condition will be monitored carefully while you are receiving this medicine. You will need important blood work done while you are taking this medicine. This drug may make you feel generally unwell. This is not uncommon, as chemotherapy can affect healthy cells as well as cancer cells. Report any side effects. Continue your course of treatment even though you feel ill unless your doctor tells you to stop. In some cases, you may be given additional medicines to help with side effects. Follow all directions for their use. Call your doctor or health care professional for advice if you get a fever, chills or sore throat, or other symptoms of a cold or flu. Do not treat yourself. This drug decreases your body's ability to fight infections. Try to avoid being around people who are sick. This medicine may increase your risk to bruise or bleed. Call your doctor or health care professional if you notice any unusual bleeding. Be careful brushing and flossing your teeth or using a toothpick because you may get an infection or bleed more easily. If you have any dental work done, tell your dentist you are receiving this medicine. Avoid taking products that contain aspirin, acetaminophen, ibuprofen, naproxen, or ketoprofen unless instructed by your doctor. These medicines may hide a fever. Do not  become pregnant while taking this medicine. Women should inform their doctor if they wish to become pregnant or think they might be pregnant. There is a potential for serious side effects to an unborn child. Talk to your health care professional or pharmacist for more information. Do not breast-feed an infant while taking this medicine. What side effects may I  notice from receiving this medicine? Side effects that you should report to your doctor or health care professional as soon as possible: -allergic reactions like skin rash, itching or hives, swelling of the face, lips, or tongue -signs of infection - fever or chills, cough, sore throat, pain or difficulty passing urine -signs of decreased platelets or bleeding - bruising, pinpoint red spots on the skin, black, tarry stools, nosebleeds -signs of decreased red blood cells - unusually weak or tired, fainting spells, lightheadedness -breathing problems -changes in hearing -changes in vision -chest pain -high blood pressure -low blood counts - This drug may decrease the number of white blood cells, red blood cells and platelets. You may be at increased risk for infections and bleeding. -nausea and vomiting -pain, swelling, redness or irritation at the injection site -pain, tingling, numbness in the hands or feet -problems with balance, talking, walking -trouble passing urine or change in the amount of urine Side effects that usually do not require medical attention (report to your doctor or health care professional if they continue or are bothersome): -hair loss -loss of appetite -metallic taste in the mouth or changes in taste This list may not describe all possible side effects. Call your doctor for medical advice about side effects. You may report side effects to FDA at 1-800-FDA-1088. Where should I keep my medicine? This drug is given in a hospital or clinic and will not be stored at home. NOTE: This sheet is a summary. It may not cover all possible information. If you have questions about this medicine, talk to your doctor, pharmacist, or health care provider.    2016, [employer]/Gold Standard. (2007-11-18 14:38:05)

## 2016-01-24 NOTE — Progress Notes (Signed)
Oncology Nurse Navigator Documentation  Oncology Nurse Navigator Flowsheets 01/24/2016  Navigator Location [facility]  Patient Visit Type MedOnc  Treatment Phase Treatment  Barriers/Navigation Needs Education  Education Other  Interventions Education Method  Education Method Verbal  Acuity Level 1  Acuity Level 1 Minimal follow up required  Time Spent with Patient 15   Patient came to see me here at [facility].  He had questions regarding if he should have his Ct for his AAA.  Dr. Julien Nordmann aware and patient updated to have scan.

## 2016-01-25 ENCOUNTER — Ambulatory Visit
Admission: RE | Admit: 2016-01-25 | Discharge: 2016-01-25 | Disposition: A | Discharge status: Home / Self Care | Payer: Medicare Other | Source: Ambulatory Visit | Attending: Vascular Surgery | Admitting: Vascular Surgery

## 2016-01-25 ENCOUNTER — Ambulatory Visit
Admission: RE | Admit: 2016-01-25 | Discharge: 2016-01-25 | Disposition: A | Discharge status: Home / Self Care | Payer: Medicare Other | Source: Ambulatory Visit | Attending: Radiation Oncology | Admitting: Radiation Oncology

## 2016-01-25 DIAGNOSIS — C3411 Malignant neoplasm of upper lobe, right bronchus or lung: Secondary | ICD-10-CM | POA: Diagnosis not present

## 2016-01-25 DIAGNOSIS — I714 Abdominal aortic aneurysm, without rupture, unspecified: Secondary | ICD-10-CM

## 2016-01-25 DIAGNOSIS — Z48812 Encounter for surgical aftercare following surgery on the circulatory system: Secondary | ICD-10-CM

## 2016-01-25 DIAGNOSIS — Z510 Encounter for antineoplastic radiation therapy: Secondary | ICD-10-CM | POA: Diagnosis not present

## 2016-01-25 MED ORDER — IOPAMIDOL (ISOVUE-370) INJECTION 76%
75.0000 mL | Freq: Once | INTRAVENOUS | Status: AC | PRN
  Administered 2016-01-25: 75 mL via INTRAVENOUS

## 2016-01-26 ENCOUNTER — Ambulatory Visit
Admission: RE | Admit: 2016-01-26 | Discharge: 2016-01-26 | Disposition: A | Discharge status: Home / Self Care | Payer: Medicare Other | Source: Ambulatory Visit | Attending: Radiation Oncology | Admitting: Radiation Oncology

## 2016-01-26 ENCOUNTER — Encounter: Payer: Self-pay | Admitting: Radiation Oncology

## 2016-01-26 ENCOUNTER — Other Ambulatory Visit: Payer: Self-pay

## 2016-01-26 ENCOUNTER — Other Ambulatory Visit: Payer: Self-pay | Admitting: Internal Medicine

## 2016-01-26 VITALS — BP 124/63 | HR 84 | Temp 97.7°F | Ht 70.0 in | Wt 187.8 lb

## 2016-01-26 DIAGNOSIS — C3411 Malignant neoplasm of upper lobe, right bronchus or lung: Secondary | ICD-10-CM

## 2016-01-26 DIAGNOSIS — Z510 Encounter for antineoplastic radiation therapy: Secondary | ICD-10-CM | POA: Diagnosis not present

## 2016-01-26 MED ORDER — CLOPIDOGREL BISULFATE 75 MG PO TABS: 75.0000 mg | ORAL_TABLET | Freq: Every day | ORAL | Status: DC

## 2016-01-26 NOTE — Progress Notes (Signed)
Paul Sandoval is here for his last fraction of radiation to his RUL Lung. He denies pain. His skin is red to his anterior chest and back. He is using cream and has enough for several weeks, and will switch to Vitamin E cream as needed. He denies shortness of breath except with exertion. He is walking for exercise at times. He is eating well, and denies throat pain. He denies coughing up any blood since he was first diagnosed, and does not have a cough currently. He was given a follow up appointment this visit.   BP 124/63 mmHg  Pulse 84  Temp(Src) 97.7 F (36.5 C)  Ht '5\' 10"'$  (1.778 m)  Wt 187 lb 12.8 oz (85.186 kg)  BMI 26.95 kg/m2  SpO2 100%   Wt Readings from Last 3 Encounters:  01/26/16 187 lb 12.8 oz (85.186 kg)  01/20/16 185 lb 6.4 oz (84.097 kg)  01/16/16 185 lb (83.915 kg)

## 2016-01-26 NOTE — Telephone Encounter (Signed)
Patient was suppose to stop plavix and restart after his bronchoscopy in March. Patient has reported taking Plavix in the last couple months at other office visits. Called patient and he was instructed on 11/29/15 to start back on his Plavix. Patient stated he has been taking Plavix since this date per Dr. Julien Nordmann.

## 2016-01-26 NOTE — Progress Notes (Signed)
  Radiation Oncology         [phone]   [name] MRN: [id number]   Date: 01/26/2016  DOB: 07/17/1941     Weekly Radiation Therapy Management    ICD-9-CM ICD-10-CM   1. 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  Paul Sandoval is here for his last fraction of radiation to his RUL Lung. He denies pain. His skin is red to his anterior chest and back. He is using cream and has enough for several weeks, and will switch to Vitamin E cream as needed. He denies shortness of breath except with exertion. He is walking for exercise at times. He is eating well, and denies throat pain. He denies coughing up any blood since he was first diagnosed, and does not have a cough currently. He was given a follow up appointment this visit.   Set-up films were reviewed. The chart was checked.  Physical Findings  height is '5\' 10"'$  (1.778 m) and weight is 187 lb 12.8 oz (85.186 kg). His temperature is 97.7 F (36.5 C). His blood pressure is 124/63 and his pulse is 84. His oxygen saturation is 100%.  Weight essentially stable. This is a pleasant well appearing male in no acute distress. He is alert and oriented.   Impression The patient is tolerating radiation.  Plan He has completed treatment today. He will return for follow up in 1 month.     Sheral Apley Tammi Klippel, M.D.    This document serves as a record of services personally performed by Tyler Pita, MD. It was created on his behalf by Arlyce Harman, a trained medical scribe. The creation of this record is based on the scribe's personal observations and the provider's statements to them. This document has been checked and approved by the attending provider.

## 2016-01-27 ENCOUNTER — Other Ambulatory Visit: Payer: Self-pay | Admitting: Medical Oncology

## 2016-01-27 DIAGNOSIS — C3411 Malignant neoplasm of upper lobe, right bronchus or lung: Secondary | ICD-10-CM

## 2016-01-30 ENCOUNTER — Ambulatory Visit (HOSPITAL_BASED_OUTPATIENT_CLINIC_OR_DEPARTMENT_OTHER): Payer: Medicare Other | Admitting: Oncology

## 2016-01-30 ENCOUNTER — Encounter: Payer: Self-pay | Admitting: Vascular Surgery

## 2016-01-30 ENCOUNTER — Other Ambulatory Visit (HOSPITAL_BASED_OUTPATIENT_CLINIC_OR_DEPARTMENT_OTHER): Payer: Medicare Other

## 2016-01-30 ENCOUNTER — Ambulatory Visit (INDEPENDENT_AMBULATORY_CARE_PROVIDER_SITE_OTHER): Payer: Medicare Other | Admitting: Vascular Surgery

## 2016-01-30 ENCOUNTER — Ambulatory Visit: Payer: Medicare Other

## 2016-01-30 ENCOUNTER — Encounter: Payer: Self-pay | Admitting: Oncology

## 2016-01-30 ENCOUNTER — Telehealth: Payer: Self-pay | Admitting: Internal Medicine

## 2016-01-30 ENCOUNTER — Ambulatory Visit (HOSPITAL_COMMUNITY)
Admission: RE | Admit: 2016-01-30 | Discharge: 2016-01-30 | Disposition: A | Discharge status: Home / Self Care | Payer: Medicare Other | Source: Ambulatory Visit | Attending: Vascular Surgery | Admitting: Vascular Surgery

## 2016-01-30 VITALS — BP 122/63 | HR 91 | Ht 70.0 in | Wt 185.7 lb

## 2016-01-30 VITALS — BP 126/53 | HR 99 | Temp 97.5°F | Resp 17 | Ht 70.0 in | Wt 185.4 lb

## 2016-01-30 DIAGNOSIS — Z87891 Personal history of nicotine dependence: Secondary | ICD-10-CM | POA: Diagnosis not present

## 2016-01-30 DIAGNOSIS — I714 Abdominal aortic aneurysm, without rupture, unspecified: Secondary | ICD-10-CM

## 2016-01-30 DIAGNOSIS — E785 Hyperlipidemia, unspecified: Secondary | ICD-10-CM | POA: Insufficient documentation

## 2016-01-30 DIAGNOSIS — I10 Essential (primary) hypertension: Secondary | ICD-10-CM | POA: Insufficient documentation

## 2016-01-30 DIAGNOSIS — C3411 Malignant neoplasm of upper lobe, right bronchus or lung: Secondary | ICD-10-CM

## 2016-01-30 DIAGNOSIS — K219 Gastro-esophageal reflux disease without esophagitis: Secondary | ICD-10-CM | POA: Insufficient documentation

## 2016-01-30 DIAGNOSIS — I6523 Occlusion and stenosis of bilateral carotid arteries: Secondary | ICD-10-CM | POA: Insufficient documentation

## 2016-01-30 LAB — COMPREHENSIVE METABOLIC PANEL
ALBUMIN: 3.5 g/dL (ref 3.5–5.0)
ALK PHOS: 73 U/L (ref 40–150)
ALT: 16 U/L (ref 0–55)
AST: 21 U/L (ref 5–34)
Anion Gap: 7 mEq/L (ref 3–11)
BUN: 17.5 mg/dL (ref 7.0–26.0)
CALCIUM: 9.5 mg/dL (ref 8.4–10.4)
CO2: 29 mEq/L (ref 22–29)
Chloride: 97 mEq/L — ABNORMAL LOW (ref 98–109)
Creatinine: 1.3 mg/dL (ref 0.7–1.3)
EGFR: 52 mL/min/{1.73_m2} — AB (ref >90)
Glucose: 94 mg/dl (ref 70–140)
POTASSIUM: 4.4 meq/L (ref 3.5–5.1)
Sodium: 134 mEq/L — ABNORMAL LOW (ref 136–145)
Total Bilirubin: 0.78 mg/dL (ref 0.20–1.20)
Total Protein: 7.3 g/dL (ref 6.4–8.3)

## 2016-01-30 LAB — VAS US CAROTID
LCCAPSYS: 112 cm/s
LEFT ECA DIAS: 0 cm/s
LICADDIAS: -25 cm/s
LICADSYS: -83 cm/s
LICAPDIAS: -38 cm/s
LICAPSYS: -220 cm/s
Left CCA dist dias: 18 cm/s
Left CCA dist sys: 91 cm/s
Left CCA prox dias: 24 cm/s
RCCAPDIAS: 21 cm/s
RIGHT CCA MID DIAS: 18 cm/s
RIGHT ECA DIAS: 0 cm/s
Right CCA prox sys: 144 cm/s
Right cca dist sys: -78 cm/s

## 2016-01-30 LAB — CBC WITH DIFFERENTIAL/PLATELET
BASO%: 0.5 % (ref 0.0–2.0)
Basophils Absolute: 0 10*3/uL (ref 0.0–0.1)
EOS%: 0.9 % (ref 0.0–7.0)
Eosinophils Absolute: 0 10*3/uL (ref 0.0–0.5)
HEMATOCRIT: 32.1 % — AB (ref 38.4–49.9)
HEMOGLOBIN: 10.6 g/dL — AB (ref 13.0–17.1)
LYMPH#: 0.4 10*3/uL — AB (ref 0.9–3.3)
LYMPH%: 10 % — ABNORMAL LOW (ref 14.0–49.0)
MCH: 29.5 pg (ref 27.2–33.4)
MCHC: 33 g/dL (ref 32.0–36.0)
MCV: 89.3 fL (ref 79.3–98.0)
MONO#: 0.3 10*3/uL (ref 0.1–0.9)
MONO%: 6.3 % (ref 0.0–14.0)
NEUT%: 82.3 % — ABNORMAL HIGH (ref 39.0–75.0)
NEUTROS ABS: 3.5 10*3/uL (ref 1.5–6.5)
Platelets: 105 10*3/uL — ABNORMAL LOW (ref 140–400)
RBC: 3.59 10*6/uL — ABNORMAL LOW (ref 4.20–5.82)
RDW: 21 % — AB (ref 11.0–14.6)
WBC: 4.3 10*3/uL (ref 4.0–10.3)

## 2016-01-30 NOTE — Progress Notes (Signed)
[place] [phone]   [phone]  OFFICE PROGRESS NOTE  [name], MD [place] [address]  DIAGNOSIS: Stage IIIA (T2b, N2, M0) non-small cell lung cancer, favoring squamous cell carcinoma presented with right upper lobe lung mass in addition to mediastinal lymphadenopathy diagnosed in March 2017.  PRIOR THERAPY: None.  CURRENT THERAPY: A course of concurrent chemoradiation with weekly carboplatin for AUC of 2 and paclitaxel 45 MG/M2. Status post 5 cycles.  INTERVAL HISTORY: Paul Sandoval 75 y.o. male returns to the clinic today for follow-up visit. The patient is feeling fine today with no specific complaints. He is tolerating his treatment with concurrent chemoradiation fairly well with no significant adverse effects. He denied having any significant nausea or vomiting. He has no fever or chills. The patient denied having any significant chest pain, shortness of breath, cough or hemoptysis. He has no dysphagia or odynophagia. He is here today for evaluation before starting cycle #6. Patient completed his radiation on 01/26/2016.  MEDICAL HISTORY: Past Medical History  Diagnosis Date  . 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 AAA (abdominal aortic aneurysm) ([place]) 2010    4.4 cm 08/2008;4.44 in 7/10 and 4.65 in 08/2009; 4.8 by CT in 11/2009; 4.3 by ultrasound in 08/2010  . Hypertension   . Hyperlipidemia   . Tobacco abuse, in remission     40 pack year total consumption; discontinued in 1996  . GERD (gastroesophageal reflux disease)   . Right bundle branch block   . Diverticulosis   . Colonic polyp 2002    polypectomy in 2002  . COPD (chronic obstructive pulmonary disease) ([place])   . CAD (coronary artery disease)     03/18/14:  PCI with DES to distal left main. 7/29: DES to the SVG to Diag  . ED (erectile dysfunction)   . IFG (impaired fasting glucose)   . Chronic rhinitis   .  Myocardial infarction [facility])     "told h/o silent MI sometime before 1996"  . Pneumonia ~ 2001; ~ 2005  . Chronic bronchitis ([place])   . Arthritis     "fingers" (03/18/2014)  . Cardiomyopathy, ischemic     Echo 03/17/14: EF 45-50%  . Encounter for antineoplastic chemotherapy 12/19/2015  . Cancer ([place])     Upper right lobe lung cancer    ALLERGIES:  is allergic to neomycin.  MEDICATIONS:  Current Outpatient Prescriptions  Medication Sig Dispense Refill  . acetaminophen (TYLENOL) 500 MG tablet Take 500 mg by mouth every 6 (six) hours as needed for mild pain.    Marland Kitchen clopidogrel (PLAVIX) 75 MG tablet Take 1 tablet (75 mg total) by mouth daily. Reported on 12/02/2015 90 tablet 3  . enalapril (VASOTEC) 10 MG tablet Take one and a half tablets PO BID 90 tablet 5  . Ferrous Sulfate (IRON) 28 MG TABS Take 28 mg by mouth daily.    . hydrochlorothiazide (HYDRODIURIL) 25 MG tablet Take 0.5 tablets (12.5 mg total) by mouth daily.    . metoprolol (LOPRESSOR) 50 MG tablet Take 50 mg by mouth 2 (two) times daily.     . Multiple Vitamins-Minerals (CENTRUM SILVER ADULT 50+) TABS Take 1 tablet by mouth daily.     Marland Kitchen omeprazole (PRILOSEC) 20 MG capsule TAKE ONE CAPSULE BY MOUTH DAILY. 90 capsule 1  . prochlorperazine (COMPAZINE) 10 MG tablet TAKE (1) TABLET BY MOUTH EVERY SIX HOURS AS NEEDED FOR NAUSEA AND VOMITING.  30 tablet 0  . simvastatin (ZOCOR) 80 MG tablet Take 0.5 tablets (40 mg total) by mouth at bedtime. 15 tablet 5  . sucralfate (CARAFATE) 1 g tablet Dissolve in 8 oz water and drink 5 minutes prior to meals and at bedtime 120 tablet 0  . VENTOLIN HFA 108 (90 Base) MCG/ACT inhaler INHALE 2 PUFFS BY MOUTH EVERY 4 TO 6 HOURS AS NEEDED FOR WHEEZING. 18 g 5  . Wound Cleansers (RADIAPLEX EX) Apply topically.    . nitroGLYCERIN (NITROSTAT) 0.4 MG SL tablet Place 1 tablet (0.4 mg total) under the tongue every 5 (five) minutes as needed for chest pain. (Patient not taking: Reported on 01/30/2016) 25 tablet 4   No  current facility-administered medications for this visit.    SURGICAL HISTORY:  Past Surgical History  Procedure Laterality Date  . Colonoscopy  2002    polypectomy-patient denies  . Laparoscopic cholecystectomy  12/2009  . Abdominal aortic endovascular stent graft N/A 12/11/2012    Procedure: ABDOMINAL AORTIC ENDOVASCULAR STENT GRAFT;  Surgeon: Mal Misty, MD;  Location: [facility];  Service: Vascular;  Laterality: N/A;  Ultrasound guided; [place]  . Abdominal aortic aneurysm repair  11/2012  . Total hip arthroplasty Left 01/21/2013    Procedure: TOTAL HIP ARTHROPLASTY ANTERIOR APPROACH;  Surgeon: Mauri Pole, MD;  Location: [facility];  Service: Orthopedics;  Laterality: Left;  . Joint replacement    . Coronary artery bypass graft  01/09/1995    "CABG X3"  . Cardiac catheterization  01/08/1995  . Coronary angioplasty with stent placement  03/18/2014    "1"  . Coronary angioplasty with stent placement  03/24/2014    "1"  . Left and right heart catheterization with coronary/graft angiogram N/A 03/18/2014    Procedure: LEFT AND RIGHT HEART CATHETERIZATION WITH Beatrix Fetters;  Surgeon: Blane Ohara, MD;  Location: [facility] CATH LAB;  Service: Cardiovascular;  Laterality: N/A;  . Percutaneous coronary stent intervention (pci-s)  03/18/2014    Procedure: PERCUTANEOUS CORONARY STENT INTERVENTION (PCI-S);  Surgeon: Blane Ohara, MD;  Location: [facility] CATH LAB;  Service: Cardiovascular;;  . Percutaneous coronary stent intervention (pci-s) N/A 03/24/2014    Procedure: PERCUTANEOUS CORONARY STENT INTERVENTION (PCI-S);  Surgeon: Blane Ohara, MD;  Location: [facility] CATH LAB;  Service: Cardiovascular;  Laterality: N/A;  . Video bronchoscopy N/A 11/17/2015    Procedure: VIDEO BRONCHOSCOPY WITH FLUORO;  Surgeon: Rigoberto Noel, MD;  Location: [place];  Service: Cardiopulmonary;  Laterality: N/A;    REVIEW OF SYSTEMS:  A comprehensive review of systems was negative.   PHYSICAL EXAMINATION: General  appearance: alert, cooperative and no distress Head: Normocephalic, without obvious abnormality, atraumatic Neck: no adenopathy, no JVD, supple, symmetrical, trachea midline and thyroid not enlarged, symmetric, no tenderness/mass/nodules Lymph nodes: Cervical, supraclavicular, and axillary nodes normal. Resp: clear to auscultation bilaterally Back: symmetric, no curvature. ROM normal. No CVA tenderness. Cardio: regular rate and rhythm, S1, S2 normal, no murmur, click, rub or gallop GI: soft, non-tender; bowel sounds normal; no masses,  no organomegaly Extremities: extremities normal, atraumatic, no cyanosis or edema  ECOG PERFORMANCE STATUS: 1 - Symptomatic but completely ambulatory  Blood pressure 126/53, pulse 99, temperature 97.5 F (36.4 C), temperature source Oral, resp. rate 17, height '5\' 10"'$  (1.778 m), weight 185 lb 6.4 oz (84.097 kg), SpO2 99 %.  LABORATORY DATA: Lab Results  Component Value Date   WBC 4.3 01/30/2016   HGB 10.6* 01/30/2016   HCT 32.1* 01/30/2016   MCV 89.3 01/30/2016  PLT 105* 01/30/2016      Chemistry      Component Value Date/Time   NA 134* 01/30/2016 1253   NA 137 11/12/2015 0357   NA 140 08/23/2015 0853   K 4.4 01/30/2016 1253   K 4.3 11/12/2015 0357   CL 100* 11/12/2015 0357   CO2 29 01/30/2016 1253   CO2 27 11/12/2015 0357   BUN 17.5 01/30/2016 1253   BUN 21* 11/12/2015 0357   BUN 16 08/23/2015 0853   CREATININE 1.3 01/30/2016 1253   CREATININE 1.34* 11/12/2015 0357   CREATININE 1.63* 08/18/2014 0758      Component Value Date/Time   CALCIUM 9.5 01/30/2016 1253   CALCIUM 8.9 11/12/2015 0357   ALKPHOS 73 01/30/2016 1253   ALKPHOS 79 11/11/2015 2019   AST 21 01/30/2016 1253   AST 22 11/11/2015 2019   ALT 16 01/30/2016 1253   ALT 16* 11/11/2015 2019   BILITOT 0.78 01/30/2016 1253   BILITOT 0.5 11/11/2015 2019   BILITOT 0.6 08/23/2015 0853       RADIOGRAPHIC STUDIES: Ct Angio Abd/pel W/ And/or W/o  01/25/2016  CLINICAL DATA:   Post stent graft EXAM: CTA ABDOMEN AND PELVIS wITHOUT AND WITH CONTRAST TECHNIQUE: Multidetector CT imaging of the abdomen and pelvis was performed using the standard protocol during bolus administration of intravenous contrast. Multiplanar reconstructed images and MIPs were obtained and reviewed to evaluate the vascular anatomy. 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 There is no evidence of endoleak. Celiac is patent.  Branch vessels patent. 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 This has progressed since the prior study. 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  Branches reconstitute The left renal artery is patent. There continues to be atherosclerotic disease at the origin of the right renal artery. Significant stenosis cannot be excluded. The appearance is stable. Right common iliac artery landing zone is patent. Internal and external iliac arteries are patent Left common iliac artery landing zone is patent. The continues to be a small penetrating atherosclerotic ulcer just be on the landing zone. Left internal and external iliac arteries are patent. Postcholecystectomy Liver, spleen, pancreas, and adrenal glands are within normal limits. Small hypodensities in the kidneys are stable. Normal appendix. Sigmoid diverticulosis without acute diverticulitis. No focal obstructing lesion of the colon Left total hip arthroplasty. Mild enlargement of the prostate gland. Degenerative disc disease and facet arthropathy at L4-5. No vertebral compression deformity. There is no free-fluid.  No obvious retroperitoneal adenopathy. Review of the MIP images confirms the above findings. IMPRESSION: Aorto bi-iliac stent graft remains patent and there is no evidence of endoleak. Aneurysm sac diameter has diminished from a  maximal diameter of 4.5 cm on the prior study to 3.9 cm on the present study. Electronically Signed   By: Marybelle Killings M.D.   On: 01/25/2016 16:38    ASSESSMENT AND PLAN: This is a very pleasant 75 year old white male recently diagnosed with a stage IIIA non-small cell lung cancer and he is currently undergoing a course of concurrent chemoradiation with weekly carboplatin and paclitaxel status post 5 cycles. He tolerated treatment well with no significant adverse effects except for thrombocytopenia.  Recommend the patient not receive his chemotherapy today since he has completed his radiation. We will plan to obtain a CT scan  of the chest in approximately 4-5 weeks for restaging purposes. He will follow-up in our office several days after his scan to review the results.  The patient was advised to call immediately if he has any concerning symptoms in the interval. The patient voices understanding of current disease status and treatment options and is in agreement with the current care plan.  All questions were answered. The patient knows to call the clinic with any problems, questions or concerns. We can certainly see the patient much sooner if necessary.  Mikey Bussing, DNP, AGPCNP-BC, AOCNP

## 2016-01-30 NOTE — Progress Notes (Signed)
Subjective:     Patient ID: Paul Sandoval, male   DOB: 1941-03-31, 75 y.o.   MRN: [id number]  HPI this 75 year old male returns for continued follow-up regarding his stent graft repair of an abdominal aortic aneurysm which was performed in April 2014. He also has moderate bilateral carotid occlusive disease which has been asymptomatic. He denies new neurologic symptoms such lateralizing weakness, aphasia, amaurosis fugax, diplopia, blurred vision, or syncope. He also has had interval diagnosis of right lung cancer and has been receiving treatments in the cancer center. He has had radiation therapy which she has completed and is now receiving chemotherapy. Takes the prognosis is good. He is managed by Dr. Inda Merlin. He was having no claudication prior to his cancer diagnosis in March but is not ambulating as much now.  Past Medical History  Diagnosis Date  . 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 AAA (abdominal aortic aneurysm) ([place]) 2010    4.4 cm 08/2008;4.44 in 7/10 and 4.65 in 08/2009; 4.8 by CT in 11/2009; 4.3 by ultrasound in 08/2010  . Hypertension   . Hyperlipidemia   . Tobacco abuse, in remission     40 pack year total consumption; discontinued in 1996  . GERD (gastroesophageal reflux disease)   . Right bundle branch block   . Diverticulosis   . Colonic polyp 2002    polypectomy in 2002  . COPD (chronic obstructive pulmonary disease) ([place])   . CAD (coronary artery disease)     03/18/14:  PCI with DES to distal left main. 7/29: DES to the SVG to Diag  . ED (erectile dysfunction)   . IFG (impaired fasting glucose)   . Chronic rhinitis   . Myocardial infarction [facility])     "told h/o silent MI sometime before 1996"  . Pneumonia ~ 2001; ~ 2005  . Chronic bronchitis ([place])   . Arthritis     "fingers" (03/18/2014)  . Cardiomyopathy, ischemic     Echo 03/17/14: EF 45-50%  . Encounter for antineoplastic chemotherapy 12/19/2015  . Cancer ([place])     Upper right lobe  lung cancer    Social History  Substance Use Topics  . Smoking status: Former Smoker -- 1.50 packs/day for 30 years    Types: Cigarettes    Start date: 12/01/1956    Quit date: 01/08/1995  . Smokeless tobacco: Never Used  . Alcohol Use: 0.0 oz/week    0 Standard drinks or equivalent per week     Comment: 03/18/2014 "no alacohol since 1996"    Family History  Problem Relation Age of Onset  . Colon cancer Neg Hx   . Colon polyps Neg Hx   . Heart disease Father   . Arthritis Mother   . Parkinsonism Mother   . Arthritis Sister     Brother with rheumatoid arthritis  . Cancer Father     Lung  . Hypertension Brother     Allergies  Allergen Reactions  . Neomycin Hives     Current outpatient prescriptions:  .  acetaminophen (TYLENOL) 500 MG tablet, Take 500 mg by mouth every 6 (six) hours as needed for mild pain., Disp: , Rfl:  .  clopidogrel (PLAVIX) 75 MG tablet, Take 1 tablet (75 mg total) by mouth daily. Reported on 12/02/2015, Disp: 90 tablet, Rfl: 3 .  enalapril (VASOTEC) 10 MG tablet, Take one and a half tablets PO BID, Disp: 90 tablet, Rfl: 5 .  Ferrous Sulfate (IRON) 28 MG TABS, Take 28 mg  by mouth daily., Disp: , Rfl:  .  hydrochlorothiazide (HYDRODIURIL) 25 MG tablet, Take 0.5 tablets (12.5 mg total) by mouth daily., Disp: , Rfl:  .  metoprolol (LOPRESSOR) 50 MG tablet, Take 50 mg by mouth 2 (two) times daily. , Disp: , Rfl:  .  Multiple Vitamins-Minerals (CENTRUM SILVER ADULT 50+) TABS, Take 1 tablet by mouth daily. , Disp: , Rfl:  .  nitroGLYCERIN (NITROSTAT) 0.4 MG SL tablet, Place 1 tablet (0.4 mg total) under the tongue every 5 (five) minutes as needed for chest pain., Disp: 25 tablet, Rfl: 4 .  omeprazole (PRILOSEC) 20 MG capsule, TAKE ONE CAPSULE BY MOUTH DAILY., Disp: 90 capsule, Rfl: 1 .  prochlorperazine (COMPAZINE) 10 MG tablet, TAKE (1) TABLET BY MOUTH EVERY SIX HOURS AS NEEDED FOR NAUSEA AND VOMITING., Disp: 30 tablet, Rfl: 0 .  simvastatin (ZOCOR) 80 MG  tablet, Take 0.5 tablets (40 mg total) by mouth at bedtime., Disp: 15 tablet, Rfl: 5 .  sucralfate (CARAFATE) 1 g tablet, Dissolve in 8 oz water and drink 5 minutes prior to meals and at bedtime, Disp: 120 tablet, Rfl: 0 .  VENTOLIN HFA 108 (90 Base) MCG/ACT inhaler, INHALE 2 PUFFS BY MOUTH EVERY 4 TO 6 HOURS AS NEEDED FOR WHEEZING., Disp: 18 g, Rfl: 5 .  Wound Cleansers (RADIAPLEX EX), Apply topically., Disp: , Rfl:   Filed Vitals:   01/30/16 1127  BP: 122/63  Pulse: 91  Height: '5\' 10"'$  (1.778 m)  Weight: 185 lb 11.2 oz (84.233 kg)  SpO2: 98%    Body mass index is 26.65 kg/(m^2).         Review of Systems denies chest pain but does have mild dyspnea on exertion and easy fatigability since his chemotherapy started. Denies lateralizing weakness, and all neurologic symptoms in mentioned in history of present illness. All other systems negative and complete review of systems     Objective:   Physical Exam BP 122/63 mmHg  Pulse 91  Ht '5\' 10"'$  (1.778 m)  Wt 185 lb 11.2 oz (84.233 kg)  BMI 26.65 kg/m2  SpO2 98%    Gen.-alert and oriented x3 in no apparent distress HEENT normal for age Lungs no rhonchi or wheezing Cardiovascular regular rhythm no murmurs carotid pulses 3+ palpable no bruits audible Abdomen soft nontender no palpable masses Musculoskeletal free of  major deformities Skin clear -no rashes Neurologic normal Lower extremities 3+ femoral and dorsalis pedis pulses palpable bilaterally with no edema  Today I ordered a carotid duplex exam which I reviewed and interpreted. Patient has moderate bilateral carotid occlusive disease which is unchanged from one year ago approximately 50-60% bilaterally.  Also ordered a CT angiogram of the abdomen and pelvis which I reviewed by computer and the report from the radiologist. Stent graft repair is intact with continued contracture of the aneurysm sac around the graft now measuring 3.9 cm in maximum diameter with no evidence of  endoleak       Assessment:     Status post endovascular stent graft repair of abdominal aortic aneurysm in 2014 with good contraction of sac and no evidence of endoleak Stable bilateral carotid occlusive disease 50-60% bilaterally with no evidence of progression of disease and remains asymptomatic New diagnosis of cancer right upper lobe in March 2017 which has received radiation treatment and now chemotherapy    Plan:     Patient return in 1 year with duplex scan of abdominal aortic aneurysm stent graft repair in the office as well as carotid  duplex exam unless he develops any symptoms in the interim and he will see Dr. Servando Snare

## 2016-01-30 NOTE — Telephone Encounter (Signed)
Gave pt apt & avs °

## 2016-01-31 ENCOUNTER — Ambulatory Visit: Payer: Medicare Other | Admitting: Vascular Surgery

## 2016-01-31 ENCOUNTER — Encounter (HOSPITAL_COMMUNITY): Payer: Medicare Other

## 2016-02-01 ENCOUNTER — Encounter: Payer: Self-pay | Admitting: Pulmonary Disease

## 2016-02-01 ENCOUNTER — Ambulatory Visit (INDEPENDENT_AMBULATORY_CARE_PROVIDER_SITE_OTHER): Payer: Medicare Other | Admitting: Pulmonary Disease

## 2016-02-01 VITALS — BP 138/62 | HR 106 | Ht 70.5 in | Wt 186.0 lb

## 2016-02-01 DIAGNOSIS — C3411 Malignant neoplasm of upper lobe, right bronchus or lung: Secondary | ICD-10-CM

## 2016-02-01 DIAGNOSIS — R042 Hemoptysis: Secondary | ICD-10-CM | POA: Diagnosis not present

## 2016-02-01 DIAGNOSIS — J449 Chronic obstructive pulmonary disease, unspecified: Secondary | ICD-10-CM | POA: Diagnosis not present

## 2016-02-01 DIAGNOSIS — J45909 Unspecified asthma, uncomplicated: Secondary | ICD-10-CM | POA: Diagnosis not present

## 2016-02-01 LAB — PULMONARY FUNCTION TEST
DL/VA % PRED: 109 %
DL/VA: 5.07 ml/min/mmHg/L
DLCO COR % PRED: 70 %
DLCO COR: 23.41 ml/min/mmHg
DLCO unc % pred: 58 %
DLCO unc: 19.38 ml/min/mmHg
FEF 25-75 Post: 1.61 L/sec
FEF 25-75 Pre: 0.63 L/sec
FEF2575-%CHANGE-POST: 153 %
FEF2575-%Pred-Post: 72 %
FEF2575-%Pred-Pre: 28 %
FEV1-%Change-Post: 31 %
FEV1-%PRED-POST: 53 %
FEV1-%Pred-Pre: 40 %
FEV1-POST: 1.64 L
FEV1-PRE: 1.25 L
FEV1FVC-%CHANGE-POST: 4 %
FEV1FVC-%Pred-Pre: 88 %
FEV6-%Change-Post: 22 %
FEV6-%PRED-PRE: 48 %
FEV6-%Pred-Post: 59 %
FEV6-POST: 2.4 L
FEV6-PRE: 1.96 L
FEV6FVC-%Change-Post: -2 %
FEV6FVC-%PRED-POST: 104 %
FEV6FVC-%PRED-PRE: 106 %
FVC-%CHANGE-POST: 25 %
FVC-%PRED-POST: 57 %
FVC-%PRED-PRE: 45 %
FVC-POST: 2.46 L
POST FEV6/FVC RATIO: 98 %
PRE FEV6/FVC RATIO: 100 %
Post FEV1/FVC ratio: 67 %
Pre FEV1/FVC ratio: 64 %
RV % pred: 149 %
RV: 3.89 L
TLC % PRED: 83 %
TLC: 5.96 L

## 2016-02-01 NOTE — Assessment & Plan Note (Addendum)
have moderate COPD-lung function is at 40% and showed good response to bronchodilator pump Trial of BREO 100 once daily, since he has such good bronchodilator response I would expect some symptomatic improvement Call us for prescription of this works

## 2016-02-01 NOTE — Assessment & Plan Note (Signed)
Follow-up CT imaging as planned by oncology

## 2016-02-01 NOTE — Patient Instructions (Signed)
You have moderate COPD-lung function is at 40% and showed good response to bronchodilator pump Trial of BREO 100 once daily Call us for prescription of this works

## 2016-02-01 NOTE — Progress Notes (Signed)
   Subjective:    Patient ID: Paul Sandoval, male    DOB: 03/21/1941, 75 y.o.   MRN: [id number]  HPI  75 yo male former smoker , quit 1996 (2 PPD x 74yr   Presented 10/2015 with large RUL mass and hemoptysis.  CT chest showed a 5.3 x 2.9cm lung mass with Right hilar , subcarinal and mediastinal LN.  He had a Bronch with cytology positive for Non small carcinoma , squamous cell.  PET scan showed paratracheal lymphadenopathy-classified as stage IIIa  02/01/2016  Chief Complaint  Patient presents with  . Follow-up    PFT done today. breathing is doing well. no concerns.   He underwent chemoradiation and seemed to tolerate this well. His breathing has been stable-he is able to mow his yard taking breaks. He is also able to play 9 holes of golf  PFTs were reviewed and show FEV1 of 40% with good bronchodilator response improving to 53%, DLCO was decreased at 58%  He uses albuterol on an as-needed basis, denies wheezing or cough or frequent chest colds His hemoptysis has not recurred  Review of Systems neg for any significant sore throat, dysphagia, itching, sneezing, nasal congestion or excess/ purulent secretions, fever, chills, sweats, unintended wt loss, pleuritic or exertional cp, hempoptysis, orthopnea pnd or change in chronic leg swelling. Also denies presyncope, palpitations, heartburn, abdominal pain, nausea, vomiting, diarrhea or change in bowel or urinary habits, dysuria,hematuria, rash, arthralgias, visual complaints, headache, numbness weakness or ataxia.     Objective:   Physical Exam  Gen. Pleasant, well-nourished, in no distress ENT - no lesions, no post nasal drip Neck: No JVD, no thyromegaly, no carotid bruits Lungs: no use of accessory muscles, no dullness to percussion, Mild prolonged expiration Cardiovascular: Rhythm regular, heart sounds  normal, no murmurs or gallops, no peripheral edema Musculoskeletal: No deformities, no cyanosis or clubbing           Assessment & Plan:

## 2016-02-01 NOTE — Progress Notes (Signed)
PFT done today. 

## 2016-02-02 ENCOUNTER — Telehealth: Payer: Self-pay | Admitting: Internal Medicine

## 2016-02-02 NOTE — Telephone Encounter (Signed)
Returned call and lvm for pt confirming appt.Marland KitchenMarland Kitchen

## 2016-02-03 NOTE — Progress Notes (Signed)
  Radiation Oncology         [phone] ________________________________  [name] MRN: [id number]  Date: 01/26/2016  DOB: 11/04/1940   End of Treatment Note    ICD-9-CM ICD-10-CM   1. 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     Indication for treatment:  Curative, Chemo-Radiotherapy       Radiation treatment dates:   12/12/2015-01/26/2016  Site/dose:   The primary tumor and involved mediastinal adenopathy were treated to 66 Gy in 33 fractions of 2 Gy.  Beams/energy:   A five field 3D conformal treatment arrangement was used delivering 6 and 10 MV photons.  Daily image-guidance CT was used to align the treatment with the targeted volume  Narrative: The patient tolerated radiation treatment relatively well.  The patient experienced some esophagitis characterized as moderate.  The patient also noted fatigue.  Plan: The patient has completed radiation treatment. The patient will return to radiation oncology clinic for routine followup in one month. I advised him to call or return sooner if he has any questions or concerns related to his recovery or treatment.  ________________________________  Sheral Apley. Tammi Klippel, M.D.

## 2016-02-05 ENCOUNTER — Encounter (HOSPITAL_COMMUNITY): Payer: Self-pay | Admitting: Emergency Medicine

## 2016-02-05 ENCOUNTER — Emergency Department (HOSPITAL_COMMUNITY): Payer: Medicare Other

## 2016-02-05 ENCOUNTER — Inpatient Hospital Stay (HOSPITAL_COMMUNITY)
Admission: EM | Admit: 2016-02-05 | Discharge: 2016-02-08 | DRG: 193 | Disposition: A | Discharge status: Home / Self Care | Payer: Medicare Other | Attending: Internal Medicine | Admitting: Internal Medicine

## 2016-02-05 DIAGNOSIS — Y95 Nosocomial condition: Secondary | ICD-10-CM | POA: Diagnosis present

## 2016-02-05 DIAGNOSIS — Z923 Personal history of irradiation: Secondary | ICD-10-CM | POA: Diagnosis not present

## 2016-02-05 DIAGNOSIS — Z7902 Long term (current) use of antithrombotics/antiplatelets: Secondary | ICD-10-CM | POA: Diagnosis not present

## 2016-02-05 DIAGNOSIS — D72819 Decreased white blood cell count, unspecified: Secondary | ICD-10-CM

## 2016-02-05 DIAGNOSIS — J449 Chronic obstructive pulmonary disease, unspecified: Secondary | ICD-10-CM | POA: Diagnosis present

## 2016-02-05 DIAGNOSIS — I129 Hypertensive chronic kidney disease with stage 1 through stage 4 chronic kidney disease, or unspecified chronic kidney disease: Secondary | ICD-10-CM | POA: Diagnosis not present

## 2016-02-05 DIAGNOSIS — Z8261 Family history of arthritis: Secondary | ICD-10-CM | POA: Diagnosis not present

## 2016-02-05 DIAGNOSIS — N189 Chronic kidney disease, unspecified: Secondary | ICD-10-CM

## 2016-02-05 DIAGNOSIS — C3490 Malignant neoplasm of unspecified part of unspecified bronchus or lung: Secondary | ICD-10-CM | POA: Diagnosis present

## 2016-02-05 DIAGNOSIS — Z951 Presence of aortocoronary bypass graft: Secondary | ICD-10-CM

## 2016-02-05 DIAGNOSIS — J440 Chronic obstructive pulmonary disease with acute lower respiratory infection: Secondary | ICD-10-CM | POA: Diagnosis not present

## 2016-02-05 DIAGNOSIS — R509 Fever, unspecified: Secondary | ICD-10-CM | POA: Diagnosis present

## 2016-02-05 DIAGNOSIS — E785 Hyperlipidemia, unspecified: Secondary | ICD-10-CM | POA: Diagnosis present

## 2016-02-05 DIAGNOSIS — D696 Thrombocytopenia, unspecified: Secondary | ICD-10-CM | POA: Diagnosis not present

## 2016-02-05 DIAGNOSIS — Z85118 Personal history of other malignant neoplasm of bronchus and lung: Secondary | ICD-10-CM

## 2016-02-05 DIAGNOSIS — K219 Gastro-esophageal reflux disease without esophagitis: Secondary | ICD-10-CM | POA: Diagnosis not present

## 2016-02-05 DIAGNOSIS — Z801 Family history of malignant neoplasm of trachea, bronchus and lung: Secondary | ICD-10-CM | POA: Diagnosis not present

## 2016-02-05 DIAGNOSIS — R918 Other nonspecific abnormal finding of lung field: Secondary | ICD-10-CM | POA: Diagnosis not present

## 2016-02-05 DIAGNOSIS — I2510 Atherosclerotic heart disease of native coronary artery without angina pectoris: Secondary | ICD-10-CM | POA: Diagnosis not present

## 2016-02-05 DIAGNOSIS — E871 Hypo-osmolality and hyponatremia: Secondary | ICD-10-CM | POA: Diagnosis not present

## 2016-02-05 DIAGNOSIS — D61810 Antineoplastic chemotherapy induced pancytopenia: Secondary | ICD-10-CM | POA: Diagnosis not present

## 2016-02-05 DIAGNOSIS — I4510 Unspecified right bundle-branch block: Secondary | ICD-10-CM | POA: Diagnosis present

## 2016-02-05 DIAGNOSIS — Z820 Family history of epilepsy and other diseases of the nervous system: Secondary | ICD-10-CM

## 2016-02-05 DIAGNOSIS — Z881 Allergy status to other antibiotic agents status: Secondary | ICD-10-CM

## 2016-02-05 DIAGNOSIS — N183 Chronic kidney disease, stage 3 (moderate): Secondary | ICD-10-CM | POA: Diagnosis not present

## 2016-02-05 DIAGNOSIS — Z86010 Personal history of colonic polyps: Secondary | ICD-10-CM | POA: Diagnosis not present

## 2016-02-05 DIAGNOSIS — I10 Essential (primary) hypertension: Secondary | ICD-10-CM | POA: Diagnosis not present

## 2016-02-05 DIAGNOSIS — D6481 Anemia due to antineoplastic chemotherapy: Secondary | ICD-10-CM | POA: Diagnosis present

## 2016-02-05 DIAGNOSIS — N179 Acute kidney failure, unspecified: Secondary | ICD-10-CM | POA: Diagnosis present

## 2016-02-05 DIAGNOSIS — Z87891 Personal history of nicotine dependence: Secondary | ICD-10-CM

## 2016-02-05 DIAGNOSIS — Z8249 Family history of ischemic heart disease and other diseases of the circulatory system: Secondary | ICD-10-CM

## 2016-02-05 DIAGNOSIS — N2889 Other specified disorders of kidney and ureter: Secondary | ICD-10-CM | POA: Diagnosis not present

## 2016-02-05 DIAGNOSIS — N289 Disorder of kidney and ureter, unspecified: Secondary | ICD-10-CM

## 2016-02-05 DIAGNOSIS — T451X5A Adverse effect of antineoplastic and immunosuppressive drugs, initial encounter: Secondary | ICD-10-CM | POA: Diagnosis present

## 2016-02-05 DIAGNOSIS — Z79899 Other long term (current) drug therapy: Secondary | ICD-10-CM

## 2016-02-05 DIAGNOSIS — J189 Pneumonia, unspecified organism: Secondary | ICD-10-CM | POA: Diagnosis not present

## 2016-02-05 LAB — CBC
HEMATOCRIT: 26.3 % — AB (ref 39.0–52.0)
Hemoglobin: 9.1 g/dL — ABNORMAL LOW (ref 13.0–17.0)
MCH: 29.7 pg (ref 26.0–34.0)
MCHC: 34.6 g/dL (ref 30.0–36.0)
MCV: 85.9 fL (ref 78.0–100.0)
PLATELETS: 100 10*3/uL — AB (ref 150–400)
RBC: 3.06 MIL/uL — ABNORMAL LOW (ref 4.22–5.81)
RDW: 18.4 % — AB (ref 11.5–15.5)
WBC: 2.9 10*3/uL — ABNORMAL LOW (ref 4.0–10.5)

## 2016-02-05 LAB — COMPREHENSIVE METABOLIC PANEL
ALT: 19 U/L (ref 17–63)
AST: 25 U/L (ref 15–41)
Albumin: 3.5 g/dL (ref 3.5–5.0)
Alkaline Phosphatase: 68 U/L (ref 38–126)
Anion gap: 10 (ref 5–15)
BUN: 15 mg/dL (ref 6–20)
CHLORIDE: 91 mmol/L — AB (ref 101–111)
CO2: 24 mmol/L (ref 22–32)
CREATININE: 1.43 mg/dL — AB (ref 0.61–1.24)
Calcium: 8.5 mg/dL — ABNORMAL LOW (ref 8.9–10.3)
GFR calc Af Amer: 53 mL/min — ABNORMAL LOW (ref >60)
GFR calc non Af Amer: 46 mL/min — ABNORMAL LOW (ref >60)
GLUCOSE: 108 mg/dL — AB (ref 65–99)
POTASSIUM: 4 mmol/L (ref 3.5–5.1)
SODIUM: 125 mmol/L — AB (ref 135–145)
Total Bilirubin: 0.7 mg/dL (ref 0.3–1.2)
Total Protein: 7 g/dL (ref 6.5–8.1)

## 2016-02-05 LAB — URINALYSIS, ROUTINE W REFLEX MICROSCOPIC
BILIRUBIN URINE: NEGATIVE
GLUCOSE, UA: NEGATIVE mg/dL
HGB URINE DIPSTICK: NEGATIVE
Ketones, ur: NEGATIVE mg/dL
Leukocytes, UA: NEGATIVE
NITRITE: NEGATIVE
PH: 6 (ref 5.0–8.0)
Protein, ur: NEGATIVE mg/dL
SPECIFIC GRAVITY, URINE: 1.008 (ref 1.005–1.030)

## 2016-02-05 LAB — CBC WITH DIFFERENTIAL/PLATELET
BASOS PCT: 1 %
Basophils Absolute: 0 10*3/uL (ref 0.0–0.1)
EOS ABS: 0 10*3/uL (ref 0.0–0.7)
EOS PCT: 1 %
HCT: 26 % — ABNORMAL LOW (ref 39.0–52.0)
HEMOGLOBIN: 9.1 g/dL — AB (ref 13.0–17.0)
Lymphocytes Relative: 20 %
Lymphs Abs: 0.6 10*3/uL — ABNORMAL LOW (ref 0.7–4.0)
MCH: 29.3 pg (ref 26.0–34.0)
MCHC: 35 g/dL (ref 30.0–36.0)
MCV: 83.6 fL (ref 78.0–100.0)
MONOS PCT: 27 %
Monocytes Absolute: 0.8 10*3/uL (ref 0.1–1.0)
NEUTROS PCT: 52 %
Neutro Abs: 1.6 10*3/uL — ABNORMAL LOW (ref 1.7–7.7)
PLATELETS: 99 10*3/uL — AB (ref 150–400)
RBC: 3.11 MIL/uL — ABNORMAL LOW (ref 4.22–5.81)
RDW: 18.4 % — ABNORMAL HIGH (ref 11.5–15.5)
WBC: 3.1 10*3/uL — ABNORMAL LOW (ref 4.0–10.5)

## 2016-02-05 LAB — PHOSPHORUS: Phosphorus: 2.7 mg/dL (ref 2.5–4.6)

## 2016-02-05 LAB — MAGNESIUM: MAGNESIUM: 1.6 mg/dL — AB (ref 1.7–2.4)

## 2016-02-05 LAB — I-STAT CG4 LACTIC ACID, ED
Lactic Acid, Venous: 0.98 mmol/L (ref 0.5–2.0)
Lactic Acid, Venous: 0.5 mmol/L (ref 0.5–2.0)

## 2016-02-05 MED ORDER — LEVOFLOXACIN IN D5W 750 MG/150ML IV SOLN
750.0000 mg | Freq: Once | INTRAVENOUS | Status: AC
  Administered 2016-02-05: 750 mg via INTRAVENOUS
  Filled 2016-02-05: qty 150

## 2016-02-05 MED ORDER — SODIUM CHLORIDE 0.9 % IV BOLUS (SEPSIS)
500.0000 mL | Freq: Once | INTRAVENOUS | Status: AC
  Administered 2016-02-05: 500 mL via INTRAVENOUS

## 2016-02-05 NOTE — ED Notes (Signed)
Pt c/o fevers peaked at 100.4 today, chills and fevers onset Friday. Hx of lung cancer, received chemo 3 weeks ago. Some associated nausea with no emesis or diarrhea. No abdominal pain, SOB.

## 2016-02-05 NOTE — H&P (Addendum)
History and Physical    Paul Sandoval FYB:[id number] DOB: 1941-08-19 DOA: 02/05/2016  PCP: [name], MD  Patient coming from: home  Chief Complaint: "I can't getting hot and cold."  HPI: Paul Sandoval is a 75 y.o. male with medical history significant of lung cancer, COPD, coronary artery disease and hypertension presented to the emergency room with chief complaint of fever. Patient states that since Saturday he has a fever off and on up to 100.4. He says he was told by his cancer doctor that if he had a fever 100.5 to come to the emergency room. Patient states that he became alarmed and came to the emergency room for further evaluation. Patient says over the last 3 days he's also had a cough. The cough has been nonproductive. Patient's wife and son have both been sick with a upper respiratory tract infection.  Patient denies headache, nausea, vomiting, abdominal pain, chest pain and rash.  ED Course: Patient was started on Levaquin in the emergency room and given IV fluid.  Review of Systems: As per HPI otherwise 10 point review of systems negative.    Past Medical History  Diagnosis Date  . 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 AAA (abdominal aortic aneurysm) ([place]) 2010    4.4 cm 08/2008;4.44 in 7/10 and 4.65 in 08/2009; 4.8 by CT in 11/2009; 4.3 by ultrasound in 08/2010  . Hypertension   . Hyperlipidemia   . Tobacco abuse, in remission     40 pack year total consumption; discontinued in 1996  . GERD (gastroesophageal reflux disease)   . Right bundle branch block   . Diverticulosis   . Colonic polyp 2002    polypectomy in 2002  . COPD (chronic obstructive pulmonary disease) ([place])   . CAD (coronary artery disease)     03/18/14:  PCI with DES to distal left main. 7/29: DES to the SVG to Diag  . ED (erectile dysfunction)   . IFG (impaired fasting glucose)   . Chronic rhinitis   . Myocardial infarction [facility])     "told h/o silent MI sometime  before 1996"  . Pneumonia ~ 2001; ~ 2005  . Chronic bronchitis ([place])   . Arthritis     "fingers" (03/18/2014)  . Cardiomyopathy, ischemic     Echo 03/17/14: EF 45-50%  . Encounter for antineoplastic chemotherapy 12/19/2015  . Cancer [facility])     Upper right lobe lung cancer    Past Surgical History  Procedure Laterality Date  . Colonoscopy  2002    polypectomy-patient denies  . Laparoscopic cholecystectomy  12/2009  . Abdominal aortic endovascular stent graft N/A 12/11/2012    Procedure: ABDOMINAL AORTIC ENDOVASCULAR STENT GRAFT;  Surgeon: Mal Misty, MD;  Location: [place];  Service: Vascular;  Laterality: N/A;  Ultrasound guided; [place]  . Abdominal aortic aneurysm repair  11/2012  . Total hip arthroplasty Left 01/21/2013    Procedure: TOTAL HIP ARTHROPLASTY ANTERIOR APPROACH;  Surgeon: Mauri Pole, MD;  Location: [place];  Service: Orthopedics;  Laterality: Left;  . Joint replacement    . Coronary artery bypass graft  01/09/1995    "CABG X3"  . Cardiac catheterization  01/08/1995  . Coronary angioplasty with stent placement  03/18/2014    "1"  . Coronary angioplasty with stent placement  03/24/2014    "1"  . Left and right heart catheterization with coronary/graft angiogram N/A 03/18/2014    Procedure: LEFT AND RIGHT HEART CATHETERIZATION WITH CORONARY/GRAFT ANGIOGRAM;  Surgeon: Blane Ohara, MD;  Location: [facility] CATH LAB;  Service: Cardiovascular;  Laterality: N/A;  . Percutaneous coronary stent intervention (pci-s)  03/18/2014    Procedure: PERCUTANEOUS CORONARY STENT INTERVENTION (PCI-S);  Surgeon: Blane Ohara, MD;  Location: [facility] CATH LAB;  Service: Cardiovascular;;  . Percutaneous coronary stent intervention (pci-s) N/A 03/24/2014    Procedure: PERCUTANEOUS CORONARY STENT INTERVENTION (PCI-S);  Surgeon: Blane Ohara, MD;  Location: [facility] CATH LAB;  Service: Cardiovascular;  Laterality: N/A;  . Video bronchoscopy N/A 11/17/2015    Procedure: VIDEO BRONCHOSCOPY WITH FLUORO;  Surgeon:  Rigoberto Noel, MD;  Location: [place];  Service: Cardiopulmonary;  Laterality: N/A;     reports that he quit smoking about 21 years ago. His smoking use included Cigarettes. He started smoking about 59 years ago. He has a 45 pack-year smoking history. He has never used smokeless tobacco. He reports that he drinks alcohol. He reports that he does not use illicit drugs.  Allergies  Allergen Reactions  . Neomycin Hives    Family History  Problem Relation Age of Onset  . Colon cancer Neg Hx   . Colon polyps Neg Hx   . Heart disease Father   . Arthritis Mother   . Parkinsonism Mother   . Arthritis Sister     Brother with rheumatoid arthritis  . Cancer Father     Lung  . Hypertension Brother     Prior to Admission medications   Medication Sig Start Date End Date Taking? Authorizing Provider  acetaminophen (TYLENOL) 500 MG tablet Take 500 mg by mouth every 6 (six) hours as needed for mild pain.   Yes Historical Provider, MD  clopidogrel (PLAVIX) 75 MG tablet Take 1 tablet (75 mg total) by mouth daily. Reported on 12/02/2015 01/26/16  Yes Josue Hector, MD  docusate sodium (COLACE) 100 MG capsule Take 100 mg by mouth daily as needed for mild constipation or moderate constipation.   Yes Historical Provider, MD  enalapril (VASOTEC) 10 MG tablet Take one and a half tablets PO BID Patient taking differently: Take 15 mg by mouth 2 (two) times daily. Take one and a half tablets PO BID 08/23/15  Yes [name], MD  Ferrous Sulfate (IRON) 28 MG TABS Take 28 mg by mouth daily.   Yes Historical Provider, MD  hydrochlorothiazide (HYDRODIURIL) 25 MG tablet Take 0.5 tablets (12.5 mg total) by mouth daily. 03/19/14  Yes Brett Canales, PA-C  metoprolol (LOPRESSOR) 50 MG tablet Take 50 mg by mouth 2 (two) times daily.    Yes Historical Provider, MD  Multiple Vitamins-Minerals (CENTRUM SILVER ADULT 50+) TABS Take 1 tablet by mouth daily.    Yes Historical Provider, MD  nitroGLYCERIN (NITROSTAT) 0.4  MG SL tablet Place 1 tablet (0.4 mg total) under the tongue every 5 (five) minutes as needed for chest pain. 03/23/15  Yes Josue Hector, MD  omeprazole (PRILOSEC) 20 MG capsule TAKE ONE CAPSULE BY MOUTH DAILY. Patient taking differently: TAKE 20 MG BY MOUTH DAILY. 01/09/16  Yes [name], MD  prochlorperazine (COMPAZINE) 10 MG tablet TAKE (1) TABLET BY MOUTH EVERY SIX HOURS AS NEEDED FOR NAUSEA AND VOMITING. 01/26/16  Yes Curt Bears, MD  simvastatin (ZOCOR) 80 MG tablet Take 0.5 tablets (40 mg total) by mouth at bedtime. 08/23/15  Yes [name], MD  sucralfate (CARAFATE) 1 g tablet Dissolve in 8 oz water and drink 5 minutes prior to meals and at bedtime 01/05/16  Yes Rex Kras  Perkins, PA-C  tiotropium (SPIRIVA) 18 MCG inhalation capsule Place 18 mcg into inhaler and inhale daily.   Yes Historical Provider, MD  VENTOLIN HFA 108 (90 Base) MCG/ACT inhaler INHALE 2 PUFFS BY MOUTH EVERY 4 TO 6 HOURS AS NEEDED FOR WHEEZING. 09/02/15  Yes [name], MD    Physical Exam: Constitutional: NAD, calm, comfortable Filed Vitals:   02/05/16 1844 02/05/16 2202  BP: 137/68 116/56  Pulse: 109 94  Temp: 98.2 F (36.8 C) 99.3 F (37.4 C)  TempSrc: Oral Oral  Resp: 16 18  SpO2: 99% 100%   Eyes: PERRL, lids and conjunctivae normal ENMT: Mucous membranes are moist. Posterior pharynx clear of any exudate or lesions.Normal dentition.  Neck: normal, supple, no masses, no thyromegaly Respiratory:  Wheezes heard throughout. Crackles heard in lower half of right lung. Normal respiratory effort. No accessory muscle use. Speaking in full sentences. Cardiovascular: Regular rate and rhythm, no murmurs / rubs / gallops. No extremity edema. 2+ pedal pulses. No carotid bruits.  Abdomen: no tenderness, no masses palpated. No hepatosplenomegaly. Bowel sounds positive.  Musculoskeletal: no clubbing / cyanosis. No joint deformity upper and lower extremities. Good ROM, no contractures. Normal muscle  tone.  Skin: no rashes, lesions, ulcers. No induration Neurologic: CN 2-12 grossly intact. Sensation intact, DTR normal. Strength 5/5 in all 4.  Psychiatric: Normal judgment and insight. Alert and oriented x 3. Normal mood.   Labs on Admission: I have personally reviewed following labs and imaging studies  CBC:  Recent Labs Lab 01/30/16 1253 02/05/16 1100  WBC 4.3 2.9*  NEUTROABS 3.5  --   HGB 10.6* 9.1*  HCT 32.1* 26.3*  MCV 89.3 85.9  PLT 105* 643*   Basic Metabolic Panel:  Recent Labs Lab 01/30/16 1253 02/05/16 1100  NA 134* 125*  K 4.4 4.0  CL  --  91*  CO2 29 24  GLUCOSE 94 108*  BUN 17.5 15  CREATININE 1.3 1.43*  CALCIUM 9.5 8.5*   GFR: Estimated Creatinine Clearance: 46.1 mL/min (by C-G formula based on Cr of 1.43). Liver Function Tests:  Recent Labs Lab 01/30/16 1253 02/05/16 1100  AST 21 25  ALT 16 19  ALKPHOS 73 68  BILITOT 0.78 0.7  PROT 7.3 7.0  ALBUMIN 3.5 3.5   No results for input(s): LIPASE, AMYLASE in the last 168 hours. No results for input(s): AMMONIA in the last 168 hours. Coagulation Profile: No results for input(s): INR, PROTIME in the last 168 hours. Cardiac Enzymes: No results for input(s): CKTOTAL, CKMB, CKMBINDEX, TROPONINI in the last 168 hours. BNP (last 3 results) No results for input(s): PROBNP in the last 8760 hours. HbA1C: No results for input(s): HGBA1C in the last 72 hours. CBG: No results for input(s): GLUCAP in the last 168 hours. Lipid Profile: No results for input(s): CHOL, HDL, LDLCALC, TRIG, CHOLHDL, LDLDIRECT in the last 72 hours. Thyroid Function Tests: No results for input(s): TSH, T4TOTAL, FREET4, T3FREE, THYROIDAB in the last 72 hours. Anemia Panel: No results for input(s): VITAMINB12, FOLATE, FERRITIN, TIBC, IRON, RETICCTPCT in the last 72 hours. Urine analysis:    Component Value Date/Time   COLORURINE YELLOW 02/05/2016 2129   APPEARANCEUR CLEAR 02/05/2016 2129   LABSPEC 1.008 02/05/2016 2129    PHURINE 6.0 02/05/2016 2129   GLUCOSEU NEGATIVE 02/05/2016 2129   HGBUR NEGATIVE 02/05/2016 2129   BILIRUBINUR NEGATIVE 02/05/2016 2129   [place] NEGATIVE 02/05/2016 2129   PROTEINUR NEGATIVE 02/05/2016 2129   UROBILINOGEN 0.2 01/19/2013 2037   NITRITE NEGATIVE  02/05/2016 2129   LEUKOCYTESUR NEGATIVE 02/05/2016 2129   Sepsis Labs: !!!!!!!!!!!!!!!!!!!!!!!!!!!!!!!!!!!!!!!!!!!! '@LABRCNTIP'$ (procalcitonin:4,lacticidven:4) )No results found for this or any previous visit (from the past 240 hour(s)).   Radiological Exams on Admission: Dg Chest 2 View  02/05/2016  CLINICAL DATA:  Acute onset of fever. Recently finished chemotherapy for right-sided lung cancer. Initial encounter. EXAM: CHEST  2 VIEW COMPARISON:  Chest radiograph performed 11/24/2015, and PET/CT performed 11/25/2015 FINDINGS: The lungs are well-aerated. New patchy right-sided airspace opacity raises concern for pneumonia. The known pulmonary mass is less well characterized on the current study. The left lung appears relatively clear. No pleural effusion or pneumothorax is seen. The heart is normal in size. The patient is status post median sternotomy, with evidence of prior CABG. No acute osseous abnormalities are seen. Scattered clips are noted about the upper abdomen. IMPRESSION: New patchy right-sided airspace opacity raises concern for pneumonia. Known pulmonary mass is less well characterized on the current study. Electronically Signed   By: Garald Balding M.D.   On: 02/05/2016 21:52    EKG: pending  Assessment/Plan Principal Problem:   Acute on chronic renal failure (HCC) Active Problems:   GERD (gastroesophageal reflux disease)   CAD (coronary artery disease)   Thrombocytopenia (HCC)   Chronic obstructive airway disease with asthma (HCC)   Hyponatremia   CAP (community acquired pneumonia)   Acute on chronic kidney failure Patient with baseline CKD 3 Elevated creatinine Differential diagnosis includes dehydration and  medication, paclitaxel Urine labs sent No symptoms of obstruction Holding ACEi Strict I's and O's  Hyponatremia Likely hypovolemic hyponatremia We'll hydrate patient Both the patient's chemotherapy drugs have side effects of dehydration Patient has no neurological symptoms Neuro checks every 4  COPD with asthma/CAP Continue BREO bid Albuterol when necessary every 2 hours shortness of breath Ventolin every 4 when necessary shortness of breath Levaquin 750 mg IV daily, pharmacy consult Continuous pulse ox  Lung cancer Oncology consult in the morning Followed by Dr. Julien Nordmann Holding Compazine  GERD Protonix substituted for Prilosec Continue Carafate prn  Low Plts Chronic, near baseline Avoid agents that will increase chance of bleed  Hypertension When necessary hydralazine 10 mg IV as needed for severe blood pressure Hold Vasotec 15 mg twice a day Holding hydrochlorothiazide 12.5 mg daily Continue Lopressor 50 mg twice a day  Anemia Continue ferrous sulfate  Coronary artery disease  continue Plavix 75 mg by mouth daily Lipitor substituted for Zocor When necessary sublingual nitroglycerin  DVT prophylaxis: SCD Code Status: full Family Communication: wife at bedside Disposition Plan: home Consults called: none Admission status: tele inpt   Elwin Mocha MD [facility] Pager [phone]  If 7PM-7AM, please contact night-coverage [email or web address] Password [email or web address]  02/05/2016, 10:08 PM

## 2016-02-05 NOTE — ED Provider Notes (Signed)
CSN: [id number]     Arrival date & time 02/05/16  1838 History   First MD Initiated Contact with Patient 02/05/16 2059     Chief Complaint  Patient presents with  . Fever     (Consider location/radiation/quality/duration/timing/severity/associated sxs/prior Treatment) HPI Patient with history of lung CA status post radiation and chemotherapy area and last chemotherapy was 3 weeks ago. Patient states he's had intermittent fever and chills for the past 3 days. Has had mild increased cough without sputum production. He been taking Tylenol consistently control temperature. Temperature peaked to 100.4 this afternoon. Denies any nausea, vomiting or diarrhea. Denies chest pain or abdominal pain. No new rashes. Denies any new urinary symptoms. Past Medical History  Diagnosis Date  . 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 AAA (abdominal aortic aneurysm) ([place]) 2010    4.4 cm 08/2008;4.44 in 7/10 and 4.65 in 08/2009; 4.8 by CT in 11/2009; 4.3 by ultrasound in 08/2010  . Hypertension   . Hyperlipidemia   . Tobacco abuse, in remission     40 pack year total consumption; discontinued in 1996  . GERD (gastroesophageal reflux disease)   . Right bundle branch block   . Diverticulosis   . Colonic polyp 2002    polypectomy in 2002  . COPD (chronic obstructive pulmonary disease) ([place])   . CAD (coronary artery disease)     03/18/14:  PCI with DES to distal left main. 7/29: DES to the SVG to Diag  . ED (erectile dysfunction)   . IFG (impaired fasting glucose)   . Chronic rhinitis   . Myocardial infarction [facility])     "told h/o silent MI sometime before 1996"  . Pneumonia ~ 2001; ~ 2005  . Chronic bronchitis ([place])   . Arthritis     "fingers" (03/18/2014)  . Cardiomyopathy, ischemic     Echo 03/17/14: EF 45-50%  . Encounter for antineoplastic chemotherapy 12/19/2015  . Cancer [facility])     Upper right lobe lung cancer   Past Surgical History  Procedure Laterality Date  .  Colonoscopy  2002    polypectomy-patient denies  . Laparoscopic cholecystectomy  12/2009  . Abdominal aortic endovascular stent graft N/A 12/11/2012    Procedure: ABDOMINAL AORTIC ENDOVASCULAR STENT GRAFT;  Surgeon: Mal Misty, MD;  Location: [place];  Service: Vascular;  Laterality: N/A;  Ultrasound guided; [place]  . Abdominal aortic aneurysm repair  11/2012  . Total hip arthroplasty Left 01/21/2013    Procedure: TOTAL HIP ARTHROPLASTY ANTERIOR APPROACH;  Surgeon: Mauri Pole, MD;  Location: [place];  Service: Orthopedics;  Laterality: Left;  . Joint replacement    . Coronary artery bypass graft  01/09/1995    "CABG X3"  . Cardiac catheterization  01/08/1995  . Coronary angioplasty with stent placement  03/18/2014    "1"  . Coronary angioplasty with stent placement  03/24/2014    "1"  . Left and right heart catheterization with coronary/graft angiogram N/A 03/18/2014    Procedure: LEFT AND RIGHT HEART CATHETERIZATION WITH Beatrix Fetters;  Surgeon: Blane Ohara, MD;  Location: [facility] CATH LAB;  Service: Cardiovascular;  Laterality: N/A;  . Percutaneous coronary stent intervention (pci-s)  03/18/2014    Procedure: PERCUTANEOUS CORONARY STENT INTERVENTION (PCI-S);  Surgeon: Blane Ohara, MD;  Location: [facility] CATH LAB;  Service: Cardiovascular;;  . Percutaneous coronary stent intervention (pci-s) N/A 03/24/2014    Procedure: PERCUTANEOUS CORONARY STENT INTERVENTION (PCI-S);  Surgeon: Blane Ohara, MD;  Location: [facility] CATH  LAB;  Service: Cardiovascular;  Laterality: N/A;  . Video bronchoscopy N/A 11/17/2015    Procedure: VIDEO BRONCHOSCOPY WITH FLUORO;  Surgeon: Rigoberto Noel, MD;  Location: [place];  Service: Cardiopulmonary;  Laterality: N/A;   Family History  Problem Relation Age of Onset  . Colon cancer Neg Hx   . Colon polyps Neg Hx   . Heart disease Father   . Arthritis Mother   . Parkinsonism Mother   . Arthritis Sister     Brother with rheumatoid arthritis  . Cancer  Father     Lung  . Hypertension Brother    Social History  Substance Use Topics  . Smoking status: Former Smoker -- 1.50 packs/day for 30 years    Types: Cigarettes    Start date: 12/01/1956    Quit date: 01/08/1995  . Smokeless tobacco: Never Used  . Alcohol Use: 0.0 oz/week    0 Standard drinks or equivalent per week     Comment: 03/18/2014 "no alacohol since 1996"    Review of Systems  Constitutional: Positive for fever, chills and fatigue.  HENT: Negative for congestion, sinus pressure and sore throat.   Respiratory: Positive for cough. Negative for shortness of breath and wheezing.   Cardiovascular: Negative for chest pain, palpitations and leg swelling.  Gastrointestinal: Negative for nausea, vomiting, abdominal pain, diarrhea and constipation.  Genitourinary: Negative for dysuria, frequency and hematuria.  Musculoskeletal: Negative for myalgias, back pain, neck pain and neck stiffness.  Skin: Negative for rash and wound.  Neurological: Negative for dizziness, weakness, light-headedness, numbness and headaches.  All other systems reviewed and are negative.     Allergies  Neomycin  Home Medications   Prior to Admission medications   Medication Sig Start Date End Date Taking? Authorizing Provider  acetaminophen (TYLENOL) 500 MG tablet Take 500 mg by mouth every 6 (six) hours as needed for mild pain.   Yes Historical Provider, MD  clopidogrel (PLAVIX) 75 MG tablet Take 1 tablet (75 mg total) by mouth daily. Reported on 12/02/2015 01/26/16  Yes Josue Hector, MD  docusate sodium (COLACE) 100 MG capsule Take 100 mg by mouth daily as needed for mild constipation or moderate constipation.   Yes Historical Provider, MD  enalapril (VASOTEC) 10 MG tablet Take one and a half tablets PO BID Patient taking differently: Take 15 mg by mouth 2 (two) times daily. Take one and a half tablets PO BID 08/23/15  Yes [name], MD  Ferrous Sulfate (IRON) 28 MG TABS Take 28 mg by mouth  daily.   Yes Historical Provider, MD  hydrochlorothiazide (HYDRODIURIL) 25 MG tablet Take 0.5 tablets (12.5 mg total) by mouth daily. 03/19/14  Yes Brett Canales, PA-C  metoprolol (LOPRESSOR) 50 MG tablet Take 50 mg by mouth 2 (two) times daily.    Yes Historical Provider, MD  Multiple Vitamins-Minerals (CENTRUM SILVER ADULT 50+) TABS Take 1 tablet by mouth daily.    Yes Historical Provider, MD  nitroGLYCERIN (NITROSTAT) 0.4 MG SL tablet Place 1 tablet (0.4 mg total) under the tongue every 5 (five) minutes as needed for chest pain. 03/23/15  Yes Josue Hector, MD  omeprazole (PRILOSEC) 20 MG capsule TAKE ONE CAPSULE BY MOUTH DAILY. Patient taking differently: TAKE 20 MG BY MOUTH DAILY. 01/09/16  Yes [name], MD  prochlorperazine (COMPAZINE) 10 MG tablet TAKE (1) TABLET BY MOUTH EVERY SIX HOURS AS NEEDED FOR NAUSEA AND VOMITING. 01/26/16  Yes Curt Bears, MD  simvastatin (ZOCOR) 80 MG tablet  Take 0.5 tablets (40 mg total) by mouth at bedtime. 08/23/15  Yes [name], MD  sucralfate (CARAFATE) 1 g tablet Dissolve in 8 oz water and drink 5 minutes prior to meals and at bedtime 01/05/16  Yes Hayden Pedro, PA-C  tiotropium [facility]) 18 MCG inhalation capsule Place 18 mcg into inhaler and inhale daily.   Yes Historical Provider, MD  VENTOLIN HFA 108 (90 Base) MCG/ACT inhaler INHALE 2 PUFFS BY MOUTH EVERY 4 TO 6 HOURS AS NEEDED FOR WHEEZING. 09/02/15  Yes [name], MD   BP 116/56 mmHg  Pulse 94  Temp(Src) 99.3 F (37.4 C) (Oral)  Resp 18  SpO2 100% Physical Exam  Constitutional: He is oriented to person, place, and time. He appears well-developed and well-nourished. No distress.  HENT:  Head: Normocephalic and atraumatic.  Mouth/Throat: Oropharynx is clear and moist. No oropharyngeal exudate.  No sinus tenderness to percussion.  Eyes: EOM are normal. Pupils are equal, round, and reactive to light.  Neck: Normal range of motion. Neck supple. No JVD present.  No  meningismus.  Cardiovascular: Normal rate and regular rhythm.  Exam reveals no gallop and no friction rub.   No murmur heard. Pulmonary/Chest: Effort normal. No respiratory distress. He has no wheezes. He has rales (patient does have a few Rales in the right upper lobe).  Abdominal: Soft. Bowel sounds are normal. He exhibits no distension and no mass. There is no tenderness. There is no rebound and no guarding.  Musculoskeletal: Normal range of motion. He exhibits no edema or tenderness.  No lower extremity edema. No calf tenderness or asymmetry. Distal pulses are equal and intact. No CVA tenderness bilaterally.  Lymphadenopathy:    He has no cervical adenopathy.  Neurological: He is alert and oriented to person, place, and time.  Moves all extremities without deficit. Sensation is grossly intact.  Skin: Skin is warm and dry. No rash noted. No erythema.  Psychiatric: He has a normal mood and affect. His behavior is normal.  Nursing note and vitals reviewed.   ED Course  Procedures (including critical care time) Labs Review Labs Reviewed  COMPREHENSIVE METABOLIC PANEL - Abnormal; Notable for the following:    Sodium 125 (*)    Chloride 91 (*)    Glucose, Bld 108 (*)    Creatinine, Ser 1.43 (*)    Calcium 8.5 (*)    GFR calc non Af Amer 46 (*)    GFR calc Af Amer 53 (*)    All other components within normal limits  CBC - Abnormal; Notable for the following:    WBC 2.9 (*)    RBC 3.06 (*)    Hemoglobin 9.1 (*)    HCT 26.3 (*)    RDW 18.4 (*)    Platelets 100 (*)    All other components within normal limits  URINALYSIS, ROUTINE W REFLEX MICROSCOPIC (NOT AT [facility])  I-STAT CG4 LACTIC ACID, ED  I-STAT CG4 LACTIC ACID, ED    Imaging Review Dg Chest 2 View  02/05/2016  CLINICAL DATA:  Acute onset of fever. Recently finished chemotherapy for right-sided lung cancer. Initial encounter. EXAM: CHEST  2 VIEW COMPARISON:  Chest radiograph performed 11/24/2015, and PET/CT performed  11/25/2015 FINDINGS: The lungs are well-aerated. New patchy right-sided airspace opacity raises concern for pneumonia. The known pulmonary mass is less well characterized on the current study. The left lung appears relatively clear. No pleural effusion or pneumothorax is seen. The heart is normal in size. The patient  is status post median sternotomy, with evidence of prior CABG. No acute osseous abnormalities are seen. Scattered clips are noted about the upper abdomen. IMPRESSION: New patchy right-sided airspace opacity raises concern for pneumonia. Known pulmonary mass is less well characterized on the current study. Electronically Signed   By: Garald Balding M.D.   On: 02/05/2016 21:52   I have personally reviewed and evaluated these images and lab results as part of my medical decision-making.   EKG Interpretation None      MDM   Final diagnoses:  CAP (community acquired pneumonia)  Hyponatremia  Renal insufficiency  Leukopenia    Patient with history of lung cancer status post chemotherapy 3 weeks ago presents with intermittent fever for the past 3 days.  X-ray with evidence of right-sided pneumonia. Started on antibiotics in the emergency department. Also given IV fluids to initiate correction of hyponatremia. Discussed with [facility] and will admit.  Julianne Rice, MD 02/05/16 2212

## 2016-02-05 NOTE — ED Notes (Signed)
Patient transported to X-ray 

## 2016-02-06 ENCOUNTER — Encounter (HOSPITAL_COMMUNITY): Payer: Self-pay | Admitting: *Deleted

## 2016-02-06 LAB — BASIC METABOLIC PANEL
ANION GAP: 6 (ref 5–15)
BUN: 13 mg/dL (ref 6–20)
CALCIUM: 8.3 mg/dL — AB (ref 8.9–10.3)
CHLORIDE: 97 mmol/L — AB (ref 101–111)
CO2: 26 mmol/L (ref 22–32)
Creatinine, Ser: 1.34 mg/dL — ABNORMAL HIGH (ref 0.61–1.24)
GFR calc Af Amer: 58 mL/min — ABNORMAL LOW (ref >60)
GFR calc non Af Amer: 50 mL/min — ABNORMAL LOW (ref >60)
GLUCOSE: 105 mg/dL — AB (ref 65–99)
POTASSIUM: 4 mmol/L (ref 3.5–5.1)
Sodium: 129 mmol/L — ABNORMAL LOW (ref 135–145)

## 2016-02-06 LAB — OSMOLALITY, URINE: OSMOLALITY UR: 203 mosm/kg — AB (ref 300–900)

## 2016-02-06 LAB — CBC
HEMATOCRIT: 22.3 % — AB (ref 39.0–52.0)
HEMOGLOBIN: 7.9 g/dL — AB (ref 13.0–17.0)
MCH: 29.7 pg (ref 26.0–34.0)
MCHC: 35.4 g/dL (ref 30.0–36.0)
MCV: 83.8 fL (ref 78.0–100.0)
Platelets: 85 10*3/uL — ABNORMAL LOW (ref 150–400)
RBC: 2.66 MIL/uL — AB (ref 4.22–5.81)
RDW: 18.5 % — ABNORMAL HIGH (ref 11.5–15.5)
WBC: 2.1 10*3/uL — ABNORMAL LOW (ref 4.0–10.5)

## 2016-02-06 MED ORDER — HYDROCORTISONE 0.5 % EX CREA
TOPICAL_CREAM | Freq: Three times a day (TID) | CUTANEOUS | Status: DC | PRN
  Administered 2016-02-06: 1 via TOPICAL
  Filled 2016-02-06: qty 28.35

## 2016-02-06 MED ORDER — ONDANSETRON HCL 4 MG/2ML IJ SOLN: 4.0000 mg | Freq: Four times a day (QID) | INTRAMUSCULAR | Status: DC | PRN

## 2016-02-06 MED ORDER — SODIUM CHLORIDE 0.9% FLUSH
3.0000 mL | Freq: Two times a day (BID) | INTRAVENOUS | Status: DC
  Administered 2016-02-06 – 2016-02-08 (×5): 3 mL via INTRAVENOUS

## 2016-02-06 MED ORDER — SODIUM CHLORIDE 0.9 % IV BOLUS (SEPSIS)
500.0000 mL | Freq: Once | INTRAVENOUS | Status: AC
  Administered 2016-02-06: 500 mL via INTRAVENOUS

## 2016-02-06 MED ORDER — ALBUTEROL SULFATE HFA 108 (90 BASE) MCG/ACT IN AERS: 2.0000 | INHALATION_SPRAY | RESPIRATORY_TRACT | Status: DC | PRN

## 2016-02-06 MED ORDER — LEVOFLOXACIN IN D5W 750 MG/150ML IV SOLN
750.0000 mg | INTRAVENOUS | Status: DC
  Administered 2016-02-06 – 2016-02-07 (×2): 750 mg via INTRAVENOUS
  Filled 2016-02-06 (×2): qty 150

## 2016-02-06 MED ORDER — METOPROLOL TARTRATE 50 MG PO TABS
50.0000 mg | ORAL_TABLET | Freq: Two times a day (BID) | ORAL | Status: DC
  Administered 2016-02-06 – 2016-02-08 (×5): 50 mg via ORAL
  Filled 2016-02-06 (×7): qty 1

## 2016-02-06 MED ORDER — ATORVASTATIN CALCIUM 40 MG PO TABS
40.0000 mg | ORAL_TABLET | Freq: Every day | ORAL | Status: DC
  Administered 2016-02-06 – 2016-02-07 (×2): 40 mg via ORAL
  Filled 2016-02-06 (×3): qty 1

## 2016-02-06 MED ORDER — HYDRALAZINE HCL 20 MG/ML IJ SOLN: 10.0000 mg | Freq: Three times a day (TID) | INTRAMUSCULAR | Status: DC | PRN

## 2016-02-06 MED ORDER — TRAZODONE HCL 50 MG PO TABS: 25.0000 mg | ORAL_TABLET | Freq: Every evening | ORAL | Status: DC | PRN

## 2016-02-06 MED ORDER — ACETAMINOPHEN 650 MG RE SUPP
650.0000 mg | Freq: Four times a day (QID) | RECTAL | Status: DC | PRN
Start: 2016-02-06 — End: 2016-02-08

## 2016-02-06 MED ORDER — SUCRALFATE 1 G PO TABS
1.0000 g | ORAL_TABLET | Freq: Two times a day (BID) | ORAL | Status: DC | PRN
  Filled 2016-02-06: qty 1

## 2016-02-06 MED ORDER — ACETAMINOPHEN 325 MG PO TABS: 650.0000 mg | ORAL_TABLET | Freq: Four times a day (QID) | ORAL | Status: DC | PRN

## 2016-02-06 MED ORDER — NITROGLYCERIN 0.4 MG SL SUBL
0.4000 mg | SUBLINGUAL_TABLET | SUBLINGUAL | Status: DC | PRN
Start: 2016-02-06 — End: 2016-02-08

## 2016-02-06 MED ORDER — DOCUSATE SODIUM 100 MG PO CAPS: 100.0000 mg | ORAL_CAPSULE | Freq: Every day | ORAL | Status: DC | PRN

## 2016-02-06 MED ORDER — ALBUTEROL SULFATE (2.5 MG/3ML) 0.083% IN NEBU
2.5000 mg | INHALATION_SOLUTION | Freq: Four times a day (QID) | RESPIRATORY_TRACT | Status: DC | PRN
  Administered 2016-02-06 – 2016-02-07 (×3): 2.5 mg via RESPIRATORY_TRACT
  Filled 2016-02-06 (×3): qty 3

## 2016-02-06 MED ORDER — FERROUS SULFATE 325 (65 FE) MG PO TABS
325.0000 mg | ORAL_TABLET | Freq: Every day | ORAL | Status: DC
  Administered 2016-02-06 – 2016-02-08 (×3): 325 mg via ORAL
  Filled 2016-02-06 (×3): qty 1

## 2016-02-06 MED ORDER — ONDANSETRON HCL 4 MG PO TABS
4.0000 mg | ORAL_TABLET | Freq: Four times a day (QID) | ORAL | Status: DC | PRN
  Administered 2016-02-07 (×2): 4 mg via ORAL
  Filled 2016-02-06 (×2): qty 1

## 2016-02-06 MED ORDER — SODIUM CHLORIDE 0.9 % IV SOLN
INTRAVENOUS | Status: DC
  Administered 2016-02-06 (×2): via INTRAVENOUS

## 2016-02-06 MED ORDER — CLOPIDOGREL BISULFATE 75 MG PO TABS
75.0000 mg | ORAL_TABLET | Freq: Every day | ORAL | Status: DC
  Administered 2016-02-06 – 2016-02-08 (×3): 75 mg via ORAL
  Filled 2016-02-06 (×3): qty 1

## 2016-02-06 NOTE — Progress Notes (Signed)
Pharmacy Antibiotic Note  Paul Sandoval is a 75 y.o. male admitted on 02/05/2016 with pneumonia.  Pharmacy has been consulted for Levofloxacin dosing.  Plan: Levofloxacin '750mg'$  iv q24hr Levofloxacin dosed based on patient weight and renal function     Height: '5\' 10"'$  (177.8 cm) Weight:  (no bed scale/standing scale no working) IBW/kg (Calculated) : 73  Temp (24hrs), Avg:98.6 F (37 C), Min:98.2 F (36.8 C), Max:99.3 F (37.4 C)   Recent Labs Lab 01/30/16 1253 02/05/16 1100 02/05/16 1909 02/05/16 2213  WBC 4.3 3.1*  2.9*  --   --   CREATININE 1.3 1.43*  --   --   LATICACIDVEN  --   --  0.98 0.50    Estimated Creatinine Clearance: 45.4 mL/min (by C-G formula based on Cr of 1.43).    Allergies  Allergen Reactions  . Neomycin Hives    Antimicrobials this admission: Levofloxacin 6/11 >>  Dose adjustments this admission: -  Microbiology results: pending  Thank you for allowing pharmacy to be a part of this patient's care.  Nani Skillern Crowford 02/06/2016 2:01 AM

## 2016-02-06 NOTE — Progress Notes (Signed)
PROGRESS NOTE        PATIENT DETAILS [name] Age: 75 y.o. Sex: male Date of Birth: [birth year] Admit Date: 02/05/2016 Admitting Physician Elwin Mocha, MD FVC:[id number] Wolfgang Phoenix, MD  Brief Narrative: Patient is a 75 y.o. malewith history of non-small cell cancer of the lung who just finished a course of chemoradiation, admitted with fever, found to have pneumonia and admitted to the hospital for further evaluation and treatment  Subjective: Feels better, requesting discharge today.  Assessment/Plan: Principal Problem: Healthcare associated pneumonia: Admitted and started on levofloxacin. Remains afebrile, nontoxic looking and has clinically improved. Suspect we could continue with levofloxacin, and that if clinical improvement continues, he should be ready to discharge home tomorrow.  Active Problems: Acute on chronic renal failure stage III: Mild AKI likely prerenal azotemia due to above. Hydrated, creatinine close to her baseline. Continue cautious hydration and recheck electrolytes tomorrow.  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 Appears euvolemic on exam, recheck electrolytes tomorrow.  Anemia: Has chronic anemia due to ongoing malignancy/chemotherapy at baseline-suspect drop in hemoglobin secondary to acute illness and IV fluids. Would favor observing for now and rechecking CBC in a.m. If hemoglobin continues to drop, then we will go ahead and transfuse PRBC.  Thrombocytopenia: Chronic issue, suspect related to recent chemotherapy. Follow  COPD: Appear stable with clear lungs. Continue bronchodilators as needed.  GERD: Continue PPI  History of non-small cell lung cancer: Seen by Dr. Julien Nordmann this morning-no further recommendations apart from follow-up as outpatient and transfuse as needed.  Hypertension: Currently  controlled with just metoprolol. Continue to hold HCTZ and enalapril for now.  History of CAD: Without chest pain or shortness of breath. Continue Plavix and statin.  DVT Prophylaxis: SCD's  Code Status: Full code   Family Communication: None AT bedside-however patient completely awake and alert and understanding of above noted plan.  Disposition Plan: Remain inpatient-but will plan on Home -hopefully 6/13  Antimicrobial agents: IV levofloxacin 6/11>>  Procedures: None  CONSULTS:  hematology/oncology  Time spent: 25 minutes-Greater than 50% of this time was spent in counseling, explanation of diagnosis, planning of further management, and coordination of care.  MEDICATIONS: Anti-infectives    Start     Dose/Rate Route Frequency Ordered Stop   02/06/16 2200  levofloxacin (LEVAQUIN) IVPB 750 mg     750 mg 100 mL/hr over 90 Minutes Intravenous Every 24 hours 02/06/16 0155     02/05/16 2200  levofloxacin (LEVAQUIN) IVPB 750 mg     750 mg 100 mL/hr over 90 Minutes Intravenous  Once 02/05/16 2158 02/05/16 2353      Scheduled Meds: . atorvastatin  40 mg Oral q1800  . clopidogrel  75 mg Oral Daily  . ferrous sulfate  325 mg Oral Daily  . levofloxacin (LEVAQUIN) IV  750 mg Intravenous Q24H  . metoprolol  50 mg Oral BID  . sodium chloride flush  3 mL Intravenous Q12H   Continuous Infusions: . sodium chloride 125 mL/hr at 02/06/16 0140   PRN Meds:.acetaminophen **OR** acetaminophen, albuterol, docusate sodium, hydrALAZINE, nitroGLYCERIN, ondansetron **OR** ondansetron (ZOFRAN) IV, sucralfate, traZODone   PHYSICAL EXAM: Vital signs: Filed Vitals:   02/06/16 0010 02/06/16 0534 02/06/16 0919 02/06/16 1000  BP: 119/61 112/49 111/54 111/54  Pulse: 104 103 107 93  Temp: 98.4 F (36.9 C) 98.1 F (36.7 C)  98.3 F (36.8 C)  TempSrc: Oral Oral  Oral  Resp: '16 16  18  '$ Height: '5\' 10"'$  (1.778 m)     SpO2: 100% 97%  98%   Filed Weights   There is no weight on file to  calculate BMI.   Gen Exam: Awake and alert with clear speech. Not in any distress  Neck: Supple, No JVD.  Chest: B/L Clear.   CVS: S1 S2 Regular, no murmurs.  Abdomen: soft, BS +, non tender, non distended.  Extremities: no edema, lower extremities warm to touch. Neurologic: Non Focal.   Skin: No Rash or lesions   Wounds: N/A.    LABORATORY DATA: CBC:  Recent Labs Lab 01/30/16 1253 02/05/16 1100 02/06/16 0504  WBC 4.3 3.1*  2.9* 2.1*  NEUTROABS 3.5 1.6*  --   HGB 10.6* 9.1*  9.1* 7.9*  HCT 32.1* 26.0*  26.3* 22.3*  MCV 89.3 83.6  85.9 83.8  PLT 105* 99*  100* 85*    Basic Metabolic Panel:  Recent Labs Lab 01/30/16 1253 02/05/16 1100 02/06/16 0504  NA 134* 125* 129*  K 4.4 4.0 4.0  CL  --  91* 97*  CO2 '29 24 26  '$ GLUCOSE 94 108* 105*  BUN 17.'5 15 13  '$ CREATININE 1.3 1.43* 1.34*  CALCIUM 9.5 8.5* 8.3*  MG  --  1.6*  --   PHOS  --  2.7  --     GFR: Estimated Creatinine Clearance: 48.4 mL/min (by C-G formula based on Cr of 1.34).  Liver Function Tests:  Recent Labs Lab 01/30/16 1253 02/05/16 1100  AST 21 25  ALT 16 19  ALKPHOS 73 68  BILITOT 0.78 0.7  PROT 7.3 7.0  ALBUMIN 3.5 3.5   No results for input(s): LIPASE, AMYLASE in the last 168 hours. No results for input(s): AMMONIA in the last 168 hours.  Coagulation Profile: No results for input(s): INR, PROTIME in the last 168 hours.  Cardiac Enzymes: No results for input(s): CKTOTAL, CKMB, CKMBINDEX, TROPONINI in the last 168 hours.  BNP (last 3 results) No results for input(s): PROBNP in the last 8760 hours.  HbA1C: No results for input(s): HGBA1C in the last 72 hours.  CBG: No results for input(s): GLUCAP in the last 168 hours.  Lipid Profile: No results for input(s): CHOL, HDL, LDLCALC, TRIG, CHOLHDL, LDLDIRECT in the last 72 hours.  Thyroid Function Tests: No results for input(s): TSH, T4TOTAL, FREET4, T3FREE, THYROIDAB in the last 72 hours.  Anemia Panel: No results for  input(s): VITAMINB12, FOLATE, FERRITIN, TIBC, IRON, RETICCTPCT in the last 72 hours.  Urine analysis:    Component Value Date/Time   COLORURINE YELLOW 02/05/2016 2129   APPEARANCEUR CLEAR 02/05/2016 2129   LABSPEC 1.008 02/05/2016 2129   PHURINE 6.0 02/05/2016 2129   GLUCOSEU NEGATIVE 02/05/2016 2129   HGBUR NEGATIVE 02/05/2016 2129   BILIRUBINUR NEGATIVE 02/05/2016 2129   KETONESUR NEGATIVE 02/05/2016 2129   PROTEINUR NEGATIVE 02/05/2016 2129   UROBILINOGEN 0.2 01/19/2013 2037   NITRITE NEGATIVE 02/05/2016 2129   LEUKOCYTESUR NEGATIVE 02/05/2016 2129    Sepsis Labs: Lactic Acid, Venous    Component Value Date/Time   LATICACIDVEN 0.50 02/05/2016 2213    MICROBIOLOGY: No results found for this or any previous visit (from the past 240 hour(s)).  RADIOLOGY STUDIES/RESULTS: Dg Chest 2 View  02/05/2016  CLINICAL DATA:  Acute onset of fever. Recently finished chemotherapy for right-sided lung cancer. Initial encounter. EXAM:  CHEST  2 VIEW COMPARISON:  Chest radiograph performed 11/24/2015, and PET/CT performed 11/25/2015 FINDINGS: The lungs are well-aerated. New patchy right-sided airspace opacity raises concern for pneumonia. The known pulmonary mass is less well characterized on the current study. The left lung appears relatively clear. No pleural effusion or pneumothorax is seen. The heart is normal in size. The patient is status post median sternotomy, with evidence of prior CABG. No acute osseous abnormalities are seen. Scattered clips are noted about the upper abdomen. IMPRESSION: New patchy right-sided airspace opacity raises concern for pneumonia. Known pulmonary mass is less well characterized on the current study. Electronically Signed   By: Garald Balding M.D.   On: 02/05/2016 21:52   Ct Angio Abd/pel W/ And/or W/o  01/25/2016  CLINICAL DATA:  Post stent graft EXAM: CTA ABDOMEN AND PELVIS wITHOUT AND WITH CONTRAST TECHNIQUE: Multidetector CT imaging of the abdomen and pelvis  was performed using the standard protocol during bolus administration of intravenous contrast. Multiplanar reconstructed images and MIPs were obtained and reviewed to evaluate the vascular anatomy. 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 There is no evidence of endoleak. Celiac is patent.  Branch vessels patent. 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 This has progressed since the prior study. 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  Branches reconstitute The left renal artery is patent. There continues to be atherosclerotic disease at the origin of the right renal artery. Significant stenosis cannot be excluded. The appearance is stable. Right common iliac artery landing zone is patent. Internal and external iliac arteries are patent Left common iliac artery landing zone is patent. The continues to be a small penetrating atherosclerotic ulcer just be on the landing zone. Left internal and external iliac arteries are patent. Postcholecystectomy Liver, spleen, pancreas, and adrenal glands are within normal limits. Small hypodensities in the kidneys are stable. Normal appendix. Sigmoid diverticulosis without acute diverticulitis. No focal obstructing lesion of the colon Left total hip arthroplasty. Mild enlargement of the prostate gland. Degenerative disc disease and facet arthropathy at L4-5. No vertebral compression deformity. There is no free-fluid.  No obvious retroperitoneal adenopathy. Review of the MIP images confirms the above findings. IMPRESSION: Aorto bi-iliac stent graft remains patent and there is no evidence of endoleak. 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 Electronically Signed   By: Marybelle Killings M.D.   On:  01/25/2016 16:38     LOS: 1 day   Oren Binet, MD  [facility] Pager:[phone]  If 7PM-7AM, please contact night-coverage [email or web address] Password [email or web address] 02/06/2016, 10:35 AM

## 2016-02-07 LAB — BASIC METABOLIC PANEL
Anion gap: 5 (ref 5–15)
BUN: 11 mg/dL (ref 6–20)
CO2: 25 mmol/L (ref 22–32)
CREATININE: 1.26 mg/dL — AB (ref 0.61–1.24)
Calcium: 8.4 mg/dL — ABNORMAL LOW (ref 8.9–10.3)
Chloride: 98 mmol/L — ABNORMAL LOW (ref 101–111)
GFR, EST NON AFRICAN AMERICAN: 54 mL/min — AB (ref >60)
Glucose, Bld: 107 mg/dL — ABNORMAL HIGH (ref 65–99)
Potassium: 4.3 mmol/L (ref 3.5–5.1)
SODIUM: 128 mmol/L — AB (ref 135–145)

## 2016-02-07 LAB — CBC
HCT: 20.7 % — ABNORMAL LOW (ref 39.0–52.0)
HCT: 26.5 % — ABNORMAL LOW (ref 39.0–52.0)
HEMOGLOBIN: 7.3 g/dL — AB (ref 13.0–17.0)
Hemoglobin: 9.2 g/dL — ABNORMAL LOW (ref 13.0–17.0)
MCH: 29.7 pg (ref 26.0–34.0)
MCH: 28.8 pg (ref 26.0–34.0)
MCHC: 35.3 g/dL (ref 30.0–36.0)
MCHC: 34.7 g/dL (ref 30.0–36.0)
MCV: 84.1 fL (ref 78.0–100.0)
MCV: 83.1 fL (ref 78.0–100.0)
PLATELETS: 90 10*3/uL — AB (ref 150–400)
PLATELETS: 101 10*3/uL — AB (ref 150–400)
RBC: 2.46 MIL/uL — AB (ref 4.22–5.81)
RBC: 3.19 MIL/uL — AB (ref 4.22–5.81)
RDW: 18.6 % — ABNORMAL HIGH (ref 11.5–15.5)
RDW: 18 % — AB (ref 11.5–15.5)
WBC: 2.2 10*3/uL — ABNORMAL LOW (ref 4.0–10.5)
WBC: 2.4 10*3/uL — AB (ref 4.0–10.5)

## 2016-02-07 LAB — ABO/RH: ABO/RH(D): O POS

## 2016-02-07 LAB — PREPARE RBC (CROSSMATCH)

## 2016-02-07 MED ORDER — DIPHENHYDRAMINE HCL 50 MG/ML IJ SOLN
25.0000 mg | Freq: Once | INTRAMUSCULAR | Status: AC
  Administered 2016-02-07: 25 mg via INTRAVENOUS
  Filled 2016-02-07: qty 1

## 2016-02-07 MED ORDER — FUROSEMIDE 10 MG/ML IJ SOLN
20.0000 mg | Freq: Once | INTRAMUSCULAR | Status: AC
  Administered 2016-02-07: 20 mg via INTRAVENOUS
  Filled 2016-02-07: qty 2

## 2016-02-07 MED ORDER — ACETAMINOPHEN 325 MG PO TABS
650.0000 mg | ORAL_TABLET | Freq: Once | ORAL | Status: AC
  Administered 2016-02-07: 650 mg via ORAL
  Filled 2016-02-07: qty 2

## 2016-02-07 MED ORDER — SODIUM CHLORIDE 0.9 % IV SOLN
Freq: Once | INTRAVENOUS | Status: AC
  Administered 2016-02-07: 250 mL via INTRAVENOUS

## 2016-02-07 NOTE — Progress Notes (Signed)
PROGRESS NOTE                                                                                                                                                                                                             Patient Demographics:    Paul Sandoval, is a 75 y.o. male, DOB - 10-13-1940, OHY:[id number]  Admit date - 02/05/2016   Admitting Physician Elwin Mocha, MD  Outpatient Primary MD for the patient is [name], MD  LOS - 2  Chief Complaint  Patient presents with  . Fever       Brief Narrative   Patient is a 75 y.o. malewith history of non-small cell cancer of the lung who just finished a course of chemoradiation, admitted with fever, found to have pneumonia and admitted to the hospital for further evaluation and treatment    Subjective:   Feels better with no shortness of breath.   Assessment  & Plan :   Healthcare associated pneumonia: Admitted and started on levofloxacin. Remains afebrile, nontoxic looking and has clinically improved. Continue with levofloxacin, as he has continue to clinically improve. Delayed discharge home to tomorrow due to anemia, see below.  Active Problems: Acute on chronic renal failure stage III: Mild AKI likely prerenal azotemia due to above. Hydrated, creatinine continues to improve. Euvolemic on exam, will discontinue IVFs.  Hyponatremia: Appears to have chronic hyponatremia at baseline (?SIADH from cancer), sodium initially improved with gentle hydration, but now seems to have plateaued. Stop IV fluids, he is euvolemic on exam, begin fluid restriction. Follow, if no improvement, begin Lasix from tomorrow.  Anemia: Has chronic anemia due to ongoing malignancy/chemotherapy at baseline-suspect drop in hemoglobin secondary to acute illness and IV fluids. Hb 7.3 this morning and transfused 1 unit of PRBC. Check CBC in the morning.  Pancytopenia: Likely related to  chemotherapy, follow CBC.  COPD: Appear stable with clear lungs. Continue bronchodilators as needed.  GERD: Continue PPI  History of non-small cell lung cancer: Seen by Dr. Julien Nordmann 6/12 further recommendations apart from follow-up as outpatient and transfuse as needed.  Hypertension: Currently controlled with just metoprolol. Continue to hold HCTZ and enalapril for now.  History of CAD: Without chest pain or shortness of breath. Continue Plavix and statin.  Code Status :   Full code  Family Communication  :  None AT bedside-however patient completely awake and alert and understanding of above noted plan.  Disposition Plan  :   Remain inpatient- but home tomorrow if CBC has normal hemoglobin levels in the A.M.  Consults  :   Hematology/Oncology  Procedures  :   None  DVT Prophylaxis  :   SCDs  Lab Results  Component Value Date   PLT 90* 02/07/2016    Inpatient Medications  Scheduled Meds: . sodium chloride   Intravenous Once  . atorvastatin  40 mg Oral q1800  . clopidogrel  75 mg Oral Daily  . ferrous sulfate  325 mg Oral Daily  . furosemide  20 mg Intravenous Once  . levofloxacin (LEVAQUIN) IV  750 mg Intravenous Q24H  . metoprolol  50 mg Oral BID  . sodium chloride flush  3 mL Intravenous Q12H   Continuous Infusions:  PRN Meds:.acetaminophen **OR** acetaminophen, albuterol, docusate sodium, hydrALAZINE, hydrocortisone cream, nitroGLYCERIN, ondansetron **OR** ondansetron (ZOFRAN) IV, sucralfate, traZODone  Antibiotics  :    Anti-infectives    Start     Dose/Rate Route Frequency Ordered Stop   02/06/16 2200  levofloxacin (LEVAQUIN) IVPB 750 mg     750 mg 100 mL/hr over 90 Minutes Intravenous Every 24 hours 02/06/16 0155     02/05/16 2200  levofloxacin (LEVAQUIN) IVPB 750 mg     750 mg 100 mL/hr over 90 Minutes Intravenous  Once 02/05/16 2158 02/05/16 2353         Objective:   Filed Vitals:   02/06/16 0010 02/07/16 0519 02/07/16 0936 02/07/16 1005    BP:  129/58 124/64 112/63  Pulse:  81 101 94  Temp:  98.6 F (37 C) 97.7 F (36.5 C) 98.6 F (37 C)  TempSrc:  Oral Oral Oral  Resp:  '18 14 18  '$ Height: '5\' 10"'$  (1.778 m)     SpO2:  98% 100% 99%    Wt Readings from Last 3 Encounters:  02/01/16 84.369 kg (186 lb)  01/30/16 84.097 kg (185 lb 6.4 oz)  01/30/16 84.233 kg (185 lb 11.2 oz)     Intake/Output Summary (Last 24 hours) at 02/07/16 1116 Last data filed at 02/07/16 0950  Gross per 24 hour  Intake 2053.5 ml  Output   1300 ml  Net  753.5 ml     Physical Exam  Awake Alert, Oriented X 3, No new F.N deficits, Normal affect [place].AT,PERRAL Supple Neck,No JVD, No cervical lymphadenopathy appriciated.  Symmetrical Chest wall movement, Good air movement bilaterally, CTAB RRR,No Gallops,Rubs or new Murmurs, No Parasternal Heave +ve B.Sounds, Abd Soft, No tenderness, No organomegaly appriciated, No rebound - guarding or rigidity. No Cyanosis, Clubbing or edema, No new Rash or bruise      Data Review:    CBC  Recent Labs Lab 02/05/16 1100 02/06/16 0504 02/07/16 0523  WBC 3.1*  2.9* 2.1* 2.2*  HGB 9.1*  9.1* 7.9* 7.3*  HCT 26.0*  26.3* 22.3* 20.7*  PLT 99*  100* 85* 90*  MCV 83.6  85.9 83.8 84.1  MCH 29.3  29.7 29.7 29.7  MCHC 35.0  34.6 35.4 35.3  RDW 18.4*  18.4* 18.5* 18.6*  LYMPHSABS 0.6*  --   --   MONOABS 0.8  --   --   EOSABS 0.0  --   --   BASOSABS 0.0  --   --     Chemistries   Recent Labs Lab 02/05/16 1100 02/06/16 0504 02/07/16 0523  NA 125* 129* 128*  K 4.0 4.0  4.3  CL 91* 97* 98*  CO2 '24 26 25  '$ GLUCOSE 108* 105* 107*  BUN '15 13 11  '$ CREATININE 1.43* 1.34* 1.26*  CALCIUM 8.5* 8.3* 8.4*  MG 1.6*  --   --   AST 25  --   --   ALT 19  --   --   ALKPHOS 68  --   --   BILITOT 0.7  --   --    ------------------------------------------------------------------------------------------------------------------ No results for input(s): CHOL, HDL, LDLCALC, TRIG, CHOLHDL, LDLDIRECT in the  last 72 hours.  No results found for: HGBA1C ------------------------------------------------------------------------------------------------------------------ No results for input(s): TSH, T4TOTAL, T3FREE, THYROIDAB in the last 72 hours.  Invalid input(s): FREET3 ------------------------------------------------------------------------------------------------------------------ No results for input(s): VITAMINB12, FOLATE, FERRITIN, TIBC, IRON, RETICCTPCT in the last 72 hours.  Coagulation profile No results for input(s): INR, PROTIME in the last 168 hours.  No results for input(s): DDIMER in the last 72 hours.  Cardiac Enzymes No results for input(s): CKMB, TROPONINI, MYOGLOBIN in the last 168 hours.  Invalid input(s): CK ------------------------------------------------------------------------------------------------------------------ No results found for: BNP  Micro Results No results found for this or any previous visit (from the past 240 hour(s)).  Radiology Reports Dg Chest 2 View  02/05/2016  CLINICAL DATA:  Acute onset of fever. Recently finished chemotherapy for right-sided lung cancer. Initial encounter. EXAM: CHEST  2 VIEW COMPARISON:  Chest radiograph performed 11/24/2015, and PET/CT performed 11/25/2015 FINDINGS: The lungs are well-aerated. New patchy right-sided airspace opacity raises concern for pneumonia. The known pulmonary mass is less well characterized on the current study. The left lung appears relatively clear. No pleural effusion or pneumothorax is seen. The heart is normal in size. The patient is status post median sternotomy, with evidence of prior CABG. No acute osseous abnormalities are seen. Scattered clips are noted about the upper abdomen. IMPRESSION: New patchy right-sided airspace opacity raises concern for pneumonia. Known pulmonary mass is less well characterized on the current study. Electronically Signed   By: Garald Balding M.D.   On: 02/05/2016 21:52    Ct Angio Abd/pel W/ And/or W/o  01/25/2016  CLINICAL DATA:  Post stent graft EXAM: CTA ABDOMEN AND PELVIS wITHOUT AND WITH CONTRAST TECHNIQUE: Multidetector CT imaging of the abdomen and pelvis was performed using the standard protocol during bolus administration of intravenous contrast. Multiplanar reconstructed images and MIPs were obtained and reviewed to evaluate the vascular anatomy. 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 There is no evidence of endoleak. Celiac is patent.  Branch vessels patent. 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 This has progressed since the prior study. 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  Branches reconstitute The left renal artery is patent. There continues to be atherosclerotic disease at the origin of the right renal artery. Significant stenosis cannot be excluded. The appearance is stable. Right common iliac artery landing zone is patent. Internal and external iliac arteries are patent Left common iliac artery landing zone is patent. The continues to be a small penetrating atherosclerotic ulcer just be on the landing zone. Left internal and external iliac arteries are patent. Postcholecystectomy Liver, spleen, pancreas, and adrenal glands are within normal limits. Small hypodensities in the kidneys are stable. Normal appendix. Sigmoid diverticulosis without acute diverticulitis. No focal obstructing lesion of the colon Left total hip arthroplasty.  Mild enlargement of the prostate gland. Degenerative disc disease and facet arthropathy at L4-5. No vertebral compression deformity. There is no free-fluid.  No obvious retroperitoneal adenopathy. Review of the MIP images confirms the above findings. IMPRESSION: Aorto bi-iliac stent graft remains patent and there is no  evidence of endoleak. 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 Electronically Signed   By: Marybelle Killings M.D.   On: 01/25/2016 16:38    Time Spent in minutes  30   Grayland Ormond PA-S on 02/07/2016 at 11:16 AM  Attending MD note  Patient was seen, examined,treatment plan was discussed with the PA-S.  I have personally reviewed the clinical findings, lab, imaging studies and management of this patient in detail. I agree with the documentation, as recorded by the PA-S.   Patient is is doing well, afebrile and nontoxic appearing.  On Exam: Gen. 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   Rest as above  [facility]

## 2016-02-07 NOTE — Progress Notes (Signed)
Date:  February 07, 2016 Chart reviewed for concurrent status and case management needs. Will continue to follow the patient for changes and needs:  hgb 7.6 receiving bld  Expected discharge date: [id number] Velva Harman, [place], [place], [place]

## 2016-02-07 NOTE — Clinical Documentation Improvement (Signed)
Internal Medicine  Abnormal Lab/Test Results:   Component     Latest Ref Rng 02/05/2016 02/05/2016 02/06/2016 02/07/2016        11:00 AM 11:00 AM    WBC     4.0 - 10.5 K/uL 3.1 (L) 2.9 (L) 2.1 (L) 2.2 (L)  RBC     4.22 - 5.81 MIL/uL 3.11 (L) 3.06 (L) 2.66 (L) 2.46 (L)  Platelets     150 - 400 K/uL 99 (L) 100 (L) 85 (L) 90 (L)    Possible Clinical Conditions associated with below indicators   .    Pancytopenia  Other Condition  Cannot Clinically Determine  Other diagnosis: 02/06/16 progress note:    Anemia:chronic anemia due to ongoing malignancy/chemotherapy,  observing for now and rechecking CBC in a.m. If hemoglobin continues to drop, then we will go ahead and transfuse PRBC. Thrombocytopenia: Chronic issue, suspect related to recent chemotherapy  Please exercise your independent, professional judgment when responding. A specific answer is not anticipated or expected. Please update your documentation within the medical record to reflect your response to this query. Thank you  Thank You,  [place] [phone]

## 2016-02-08 DIAGNOSIS — J449 Chronic obstructive pulmonary disease, unspecified: Secondary | ICD-10-CM

## 2016-02-08 DIAGNOSIS — I10 Essential (primary) hypertension: Secondary | ICD-10-CM

## 2016-02-08 DIAGNOSIS — K219 Gastro-esophageal reflux disease without esophagitis: Secondary | ICD-10-CM

## 2016-02-08 DIAGNOSIS — D696 Thrombocytopenia, unspecified: Secondary | ICD-10-CM

## 2016-02-08 DIAGNOSIS — N179 Acute kidney failure, unspecified: Secondary | ICD-10-CM

## 2016-02-08 DIAGNOSIS — J189 Pneumonia, unspecified organism: Principal | ICD-10-CM

## 2016-02-08 DIAGNOSIS — I2510 Atherosclerotic heart disease of native coronary artery without angina pectoris: Secondary | ICD-10-CM

## 2016-02-08 DIAGNOSIS — E871 Hypo-osmolality and hyponatremia: Secondary | ICD-10-CM

## 2016-02-08 DIAGNOSIS — N189 Chronic kidney disease, unspecified: Secondary | ICD-10-CM

## 2016-02-08 DIAGNOSIS — J45909 Unspecified asthma, uncomplicated: Secondary | ICD-10-CM

## 2016-02-08 LAB — CBC
HCT: 25.8 % — ABNORMAL LOW (ref 39.0–52.0)
HEMOGLOBIN: 9 g/dL — AB (ref 13.0–17.0)
MCH: 29.1 pg (ref 26.0–34.0)
MCHC: 34.9 g/dL (ref 30.0–36.0)
MCV: 83.5 fL (ref 78.0–100.0)
Platelets: 108 10*3/uL — ABNORMAL LOW (ref 150–400)
RBC: 3.09 MIL/uL — ABNORMAL LOW (ref 4.22–5.81)
RDW: 18.4 % — ABNORMAL HIGH (ref 11.5–15.5)
WBC: 2.2 10*3/uL — ABNORMAL LOW (ref 4.0–10.5)

## 2016-02-08 LAB — BASIC METABOLIC PANEL
ANION GAP: 10 (ref 5–15)
BUN: 12 mg/dL (ref 6–20)
CALCIUM: 8.8 mg/dL — AB (ref 8.9–10.3)
CO2: 25 mmol/L (ref 22–32)
Chloride: 93 mmol/L — ABNORMAL LOW (ref 101–111)
Creatinine, Ser: 1.33 mg/dL — ABNORMAL HIGH (ref 0.61–1.24)
GFR calc Af Amer: 58 mL/min — ABNORMAL LOW (ref >60)
GFR calc non Af Amer: 50 mL/min — ABNORMAL LOW (ref >60)
GLUCOSE: 131 mg/dL — AB (ref 65–99)
Potassium: 3.8 mmol/L (ref 3.5–5.1)
Sodium: 128 mmol/L — ABNORMAL LOW (ref 135–145)

## 2016-02-08 LAB — TYPE AND SCREEN
ABO/RH(D): O POS
ANTIBODY SCREEN: NEGATIVE
Unit division: 0

## 2016-02-08 MED ORDER — SODIUM CHLORIDE 1 G PO TABS
2.0000 g | ORAL_TABLET | Freq: Two times a day (BID) | ORAL | Status: DC
  Filled 2016-02-08 (×3): qty 2

## 2016-02-08 MED ORDER — LEVOFLOXACIN 750 MG PO TABS: 750.0000 mg | ORAL_TABLET | Freq: Every day | ORAL | Status: DC

## 2016-02-08 MED ORDER — SODIUM CHLORIDE 1 G PO TABS: 2.0000 g | ORAL_TABLET | Freq: Two times a day (BID) | ORAL | Status: DC

## 2016-02-08 NOTE — Discharge Summary (Signed)
Physician Discharge Summary  ALEXX MCBURNEY DXA:[id number] DOB: Mar 07, 1941 DOA: 02/05/2016  PCP: [name], MD  Admit date: 02/05/2016 Discharge date: 02/08/2016  Time spent: 55 minutes  Recommendations for Outpatient Follow-up:  1. 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 Found to have a right sided pneumonia.   Hospital Course:  Healthcare associated pneumonia: Admitted and started on levofloxacin. Remains afebrile, nontoxic looking and has clinically improved. Continue with levofloxacin.  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 HCTZ stopped- sodium initially improved with gentle hydration, but now seems to have plateaued. 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 Hb 7.3 - transfused 1 unit of PRBC.  Hb now 9.0  Pancytopenia: Likely related to chemotherapy, follow CBC.  COPD: Appear stable with clear lungs. Continue bronchodilators as needed.  GERD: Continue PPI  Non-small cell lung cancer: Seen by Dr. Julien Nordmann 6/12 further  recommendations apart from follow-up as outpatient and transfuse as needed.  Hypertension: Currently controlled with just metoprolol. Continue to hold HCTZ and Enalapril  History of CAD:  Continue Plavix and statin.   Discharge Exam: Filed Weights   Filed Vitals:   02/08/16 0701 02/08/16 0848  BP: 109/80 129/59  Pulse: 106 111  Temp: 98 F (36.7 C) 98.2 F (36.8 C)  Resp: 17 16    General: AAO x 3, no distress Cardiovascular: RRR, no murmurs  Respiratory: clear to auscultation bilaterally GI: soft, non-tender, non-distended, bowel sound positive  Discharge Instructions You were cared for by a hospitalist during your hospital stay. If you have any questions about your discharge medications or the care you received while you were in the hospital after you are discharged, you can call the unit and asked to speak with the hospitalist on call if the hospitalist that took care of you is not available. Once you are discharged, your primary care physician will handle any further medical issues. Please note that NO REFILLS for any discharge medications will be authorized once you are discharged, as it is imperative that you return to your primary care physician (or establish a relationship with a primary care physician if you do not have one) for your aftercare needs so that they can reassess your need for medications and monitor your lab values.      Discharge Instructions    Discharge instructions    Complete by:  As directed   Regular diet but drink < 1 L of liquids total You need a Bmet tomorrow     Increase activity slowly    Complete by:  As directed  Medication List    STOP taking these medications        enalapril 10 MG tablet  Commonly known as:  VASOTEC     hydrochlorothiazide 25 MG tablet  Commonly known as:  HYDRODIURIL      TAKE these medications        acetaminophen 500 MG tablet  Commonly known as:  TYLENOL  Take 500 mg by mouth every 6 (six)  hours as needed for mild pain.     CENTRUM SILVER ADULT 50+ Tabs  Take 1 tablet by mouth daily.     clopidogrel 75 MG tablet  Commonly known as:  PLAVIX  Take 1 tablet (75 mg total) by mouth daily. Reported on 12/02/2015     docusate sodium 100 MG capsule  Commonly known as:  COLACE  Take 100 mg by mouth daily as needed for mild constipation or moderate constipation.     Iron 28 MG Tabs  Take 28 mg by mouth daily.     levofloxacin 750 MG tablet  Commonly known as:  LEVAQUIN  Take 1 tablet (750 mg total) by mouth daily.     metoprolol 50 MG tablet  Commonly known as:  LOPRESSOR  Take 50 mg by mouth 2 (two) times daily.     nitroGLYCERIN 0.4 MG SL tablet  Commonly known as:  NITROSTAT  Place 1 tablet (0.4 mg total) under the tongue every 5 (five) minutes as needed for chest pain.     omeprazole 20 MG capsule  Commonly known as:  PRILOSEC  TAKE ONE CAPSULE BY MOUTH DAILY.     prochlorperazine 10 MG tablet  Commonly known as:  COMPAZINE  TAKE (1) TABLET BY MOUTH EVERY SIX HOURS AS NEEDED FOR NAUSEA AND VOMITING.     simvastatin 80 MG tablet  Commonly known as:  ZOCOR  Take 0.5 tablets (40 mg total) by mouth at bedtime.     sodium chloride 1 g tablet  Take 2 tablets (2 g total) by mouth 2 (two) times daily with a meal.     sucralfate 1 g tablet  Commonly known as:  CARAFATE  Dissolve in 8 oz water and drink 5 minutes prior to meals and at bedtime     tiotropium 18 MCG inhalation capsule  Commonly known as:  SPIRIVA  Place 18 mcg into inhaler and inhale daily.     VENTOLIN HFA 108 (90 Base) MCG/ACT inhaler  Generic drug:  albuterol  INHALE 2 PUFFS BY MOUTH EVERY 4 TO 6 HOURS AS NEEDED FOR WHEEZING.       Allergies  Allergen Reactions  . Neomycin Hives   Follow-up Information    Follow up with [name], MD. Go on 02/09/2016.   Specialty:  Family Medicine   Why:  02/09/2016 @ 9:30 for BMET   Contact information:   [address] [place]  [address] [phone]        The results of significant diagnostics from this hospitalization (including imaging, microbiology, ancillary and laboratory) are listed below for reference.    Significant Diagnostic Studies: Dg Chest 2 View  02/05/2016  CLINICAL DATA:  Acute onset of fever. Recently finished chemotherapy for right-sided lung cancer. Initial encounter. EXAM: CHEST  2 VIEW COMPARISON:  Chest radiograph performed 11/24/2015, and PET/CT performed 11/25/2015 FINDINGS: The lungs are well-aerated. New patchy right-sided airspace opacity raises concern for pneumonia. The known pulmonary mass is less well characterized on the current study. The left lung appears relatively clear. No  pleural effusion or pneumothorax is seen. The heart is normal in size. The patient is status post median sternotomy, with evidence of prior CABG. No acute osseous abnormalities are seen. Scattered clips are noted about the upper abdomen. IMPRESSION: New patchy right-sided airspace opacity raises concern for pneumonia. Known pulmonary mass is less well characterized on the current study. Electronically Signed   By: Garald Balding M.D.   On: 02/05/2016 21:52   Ct Angio Abd/pel W/ And/or W/o  01/25/2016  CLINICAL DATA:  Post stent graft EXAM: CTA ABDOMEN AND PELVIS wITHOUT AND WITH CONTRAST TECHNIQUE: Multidetector CT imaging of the abdomen and pelvis was performed using the standard protocol during bolus administration of intravenous contrast. Multiplanar reconstructed images and MIPs were obtained and reviewed to evaluate the vascular anatomy. 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 There is no evidence of endoleak. Celiac is patent.  Branch vessels patent. 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 This has progressed  since the prior study. 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  Branches reconstitute The left renal artery is patent. There continues to be atherosclerotic disease at the origin of the right renal artery. Significant stenosis cannot be excluded. The appearance is stable. Right common iliac artery landing zone is patent. Internal and external iliac arteries are patent Left common iliac artery landing zone is patent. The continues to be a small penetrating atherosclerotic ulcer just be on the landing zone. Left internal and external iliac arteries are patent. Postcholecystectomy Liver, spleen, pancreas, and adrenal glands are within normal limits. Small hypodensities in the kidneys are stable. Normal appendix. Sigmoid diverticulosis without acute diverticulitis. No focal obstructing lesion of the colon Left total hip arthroplasty. Mild enlargement of the prostate gland. Degenerative disc disease and facet arthropathy at L4-5. No vertebral compression deformity. There is no free-fluid.  No obvious retroperitoneal adenopathy. Review of the MIP images confirms the above findings. IMPRESSION: Aorto bi-iliac stent graft remains patent and there is no evidence of endoleak. 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 Electronically Signed   By: Marybelle Killings M.D.   On: 01/25/2016 16:38    Microbiology: No results found for this or any previous visit (from the past 240 hour(s)).   Labs: Basic Metabolic Panel:  Recent Labs Lab 02/05/16 1100 02/06/16 0504 02/07/16 0523 02/08/16 0955  NA 125* 129* 128* 128*  K 4.0 4.0 4.3 3.8  CL 91* 97* 98* 93*  CO2 '24 26 25 25  '$ GLUCOSE 108* 105* 107* 131*  BUN '15 13 11 12  '$ CREATININE 1.43* 1.34* 1.26* 1.33*  CALCIUM 8.5* 8.3* 8.4* 8.8*  MG 1.6*  --   --   --   PHOS 2.7  --   --   --    Liver Function Tests:  Recent Labs Lab 02/05/16 1100  AST 25  ALT 19  ALKPHOS 68  BILITOT 0.7   PROT 7.0  ALBUMIN 3.5   No results for input(s): LIPASE, AMYLASE in the last 168 hours. No results for input(s): AMMONIA in the last 168 hours. CBC:  Recent Labs Lab 02/05/16 1100 02/06/16 0504 02/07/16 0523 02/07/16 1314 02/08/16 0955  WBC 3.1*  2.9* 2.1* 2.2* 2.4* 2.2*  NEUTROABS 1.6*  --   --   --   --  HGB 9.1*  9.1* 7.9* 7.3* 9.2* 9.0*  HCT 26.0*  26.3* 22.3* 20.7* 26.5* 25.8*  MCV 83.6  85.9 83.8 84.1 83.1 83.5  PLT 99*  100* 85* 90* 101* 108*   Cardiac Enzymes: No results for input(s): CKTOTAL, CKMB, CKMBINDEX, TROPONINI in the last 168 hours. BNP: BNP (last 3 results) No results for input(s): BNP in the last 8760 hours.  ProBNP (last 3 results) No results for input(s): PROBNP in the last 8760 hours.  CBG: No results for input(s): GLUCAP in the last 168 hours.     SignedDebbe Odea, MD [facility] 02/08/2016, 12:10 PM

## 2016-02-08 NOTE — Care Management Important Message (Signed)
Important Message  Patient Details  [name] MRN: [id number] Date of Birth: 24-Sep-1940   Medicare Important Message Given:  Yes    Camillo Flaming 02/08/2016, 10:09 AMImportant Message  Patient Details  [name] MRN: [id number] Date of Birth: 11/05/1940   Medicare Important Message Given:  Yes    Camillo Flaming 02/08/2016, 10:09 AM

## 2016-02-09 ENCOUNTER — Encounter: Payer: Self-pay | Admitting: Family Medicine

## 2016-02-09 ENCOUNTER — Ambulatory Visit (INDEPENDENT_AMBULATORY_CARE_PROVIDER_SITE_OTHER): Payer: Medicare Other | Admitting: Family Medicine

## 2016-02-09 ENCOUNTER — Telehealth: Payer: Self-pay | Admitting: Pulmonary Disease

## 2016-02-09 VITALS — BP 112/62 | Ht 70.0 in | Wt 185.1 lb

## 2016-02-09 DIAGNOSIS — E871 Hypo-osmolality and hyponatremia: Secondary | ICD-10-CM

## 2016-02-09 DIAGNOSIS — J45909 Unspecified asthma, uncomplicated: Secondary | ICD-10-CM

## 2016-02-09 DIAGNOSIS — D649 Anemia, unspecified: Secondary | ICD-10-CM

## 2016-02-09 DIAGNOSIS — J449 Chronic obstructive pulmonary disease, unspecified: Secondary | ICD-10-CM | POA: Diagnosis not present

## 2016-02-09 DIAGNOSIS — J189 Pneumonia, unspecified organism: Secondary | ICD-10-CM

## 2016-02-09 MED ORDER — FLUTICASONE FUROATE-VILANTEROL 100-25 MCG/INH IN AEPB: 1.0000 | INHALATION_SPRAY | Freq: Every day | RESPIRATORY_TRACT | Status: DC

## 2016-02-09 NOTE — Progress Notes (Signed)
   Subjective:    Patient ID: Paul Sandoval, male    DOB: 07/31/1941, 75 y.o.   MRN: [id number]  HPI Patient is here today for a hospital follow up visit. Patient was admitted to [facility] on 02/05/16 for HCAP (healthcare-associated pneumonia).   Entire hospital records reviewed in the presence of the patient. He developed pneumonia. Was hospitalized.  Just was discharged from the hospital one day ago so we are following up with not only a phone call but actually the face-to-face assessment within 48 hours next  Notes ongoing cough compliant with antibiotic. Overall feeling better.  Patient status was complicated by substantial anemia. He received transfusions for this. This was felt to be primarily related to both chemotherapy and his underlying cancer as far as anemia of chronic disease. Next  In addition was suspected that his low sodium potentially could be coming from his lung cancer   Patient states that he is very tired at times.  Patient states that he has no new concerns at this time.     Review of Systems No headache, no major weight loss or weight gain, no chest pain no back pain abdominal pain no change in bowel habits complete ROS otherwise negative     Objective:   Physical Exam  Alert no acute distress mild malaise. Vitals stable blood pressure good on repeat afebrile lungs rare wheeze no tachypnea no inspiratory crackles heart rare rhythm    Assessment & Plan:  Impression status post healthcare associated pneumonia. And on antibiotics on improving #2 hyponatremia improved on last visit needs to be reassessed #3 anemia improved on last evaluation also needs reassessment plan finish antibiotics. Maintain pulmonary meds importance discussed. Repeat blood work mid next week. Discussed with patient if he were to need further transfusions will receive through his hematologist because they are a set up for this.

## 2016-02-09 NOTE — Telephone Encounter (Signed)
Spoke with pt. He needs a prescription for [facility] sent in. Rx has been sent in. Nothing further was needed.

## 2016-02-10 ENCOUNTER — Telehealth: Payer: Self-pay | Admitting: Pulmonary Disease

## 2016-02-10 NOTE — Telephone Encounter (Signed)
Spoke with pt, states that he needs a hard copy of Breo and a letter from our office stating why he is on the [place], and also needs the letter stating that he must take breo and not an alternative, as well as a copy of the last OV note.  Pt wants to pick this letter up on Monday if possible.   RA are you ok with this letter being typed?  Thanks.

## 2016-02-13 NOTE — Telephone Encounter (Signed)
Called and spoke with pt. I explained to him that the message was to RA to address and that we will contact him once we receive his approval. He voiced understanding and had no further questions.

## 2016-02-13 NOTE — Telephone Encounter (Signed)
Pt returning call.Paul Sandoval ° °

## 2016-02-13 NOTE — Telephone Encounter (Signed)
Okay to give him a letter stating that he has moderate COPD with FEV1 40% and had symptomatic improvement with Breo  ( steroid/long-acting bronchodilator combination ) If alternative medications are approved by his insurance, we can put him on those instead

## 2016-02-14 NOTE — Telephone Encounter (Signed)
Patient in LOBBY to speak to nurse

## 2016-02-14 NOTE — Telephone Encounter (Signed)
Attempted to contact pharmacy regarding Breo and if it needs PA. Their phones are down. [employer] office and she is having someone return our call.   LMTCB

## 2016-02-14 NOTE — Telephone Encounter (Signed)
Letter written for patient to take to [facility] so he can get Breo for $8 co pay. Patient came by to pick up letter today. Gave sample of Breo Nothing further needed.

## 2016-02-15 DIAGNOSIS — E871 Hypo-osmolality and hyponatremia: Secondary | ICD-10-CM | POA: Diagnosis not present

## 2016-02-15 DIAGNOSIS — D649 Anemia, unspecified: Secondary | ICD-10-CM | POA: Diagnosis not present

## 2016-02-16 ENCOUNTER — Other Ambulatory Visit: Payer: Self-pay | Admitting: Nurse Practitioner

## 2016-02-16 ENCOUNTER — Ambulatory Visit (HOSPITAL_BASED_OUTPATIENT_CLINIC_OR_DEPARTMENT_OTHER): Payer: Medicare Other | Admitting: Nurse Practitioner

## 2016-02-16 ENCOUNTER — Telehealth: Payer: Self-pay | Admitting: Nurse Practitioner

## 2016-02-16 ENCOUNTER — Ambulatory Visit (HOSPITAL_COMMUNITY)
Admission: RE | Admit: 2016-02-16 | Discharge: 2016-02-16 | Disposition: A | Discharge status: Home / Self Care | Payer: Medicare Other | Source: Ambulatory Visit | Attending: Nurse Practitioner | Admitting: Nurse Practitioner

## 2016-02-16 ENCOUNTER — Encounter: Payer: Self-pay | Admitting: *Deleted

## 2016-02-16 ENCOUNTER — Telehealth: Payer: Self-pay | Admitting: *Deleted

## 2016-02-16 ENCOUNTER — Other Ambulatory Visit (HOSPITAL_BASED_OUTPATIENT_CLINIC_OR_DEPARTMENT_OTHER): Payer: Medicare Other

## 2016-02-16 VITALS — BP 151/62 | HR 114 | Temp 98.5°F | Resp 18 | Ht 70.0 in | Wt 192.9 lb

## 2016-02-16 DIAGNOSIS — J189 Pneumonia, unspecified organism: Secondary | ICD-10-CM | POA: Diagnosis not present

## 2016-02-16 DIAGNOSIS — R609 Edema, unspecified: Secondary | ICD-10-CM | POA: Diagnosis not present

## 2016-02-16 DIAGNOSIS — T451X5A Adverse effect of antineoplastic and immunosuppressive drugs, initial encounter: Secondary | ICD-10-CM

## 2016-02-16 DIAGNOSIS — E871 Hypo-osmolality and hyponatremia: Secondary | ICD-10-CM | POA: Diagnosis not present

## 2016-02-16 DIAGNOSIS — C3411 Malignant neoplasm of upper lobe, right bronchus or lung: Secondary | ICD-10-CM

## 2016-02-16 DIAGNOSIS — R918 Other nonspecific abnormal finding of lung field: Secondary | ICD-10-CM | POA: Insufficient documentation

## 2016-02-16 DIAGNOSIS — R0602 Shortness of breath: Secondary | ICD-10-CM | POA: Diagnosis not present

## 2016-02-16 DIAGNOSIS — D6481 Anemia due to antineoplastic chemotherapy: Secondary | ICD-10-CM

## 2016-02-16 DIAGNOSIS — R509 Fever, unspecified: Secondary | ICD-10-CM

## 2016-02-16 DIAGNOSIS — R05 Cough: Secondary | ICD-10-CM | POA: Diagnosis not present

## 2016-02-16 LAB — CBC WITH DIFFERENTIAL/PLATELET
BASO%: 0.3 % (ref 0.0–2.0)
BASOS ABS: 0 10*3/uL (ref 0.0–0.2)
BASOS ABS: 0 10*3/uL (ref 0.0–0.1)
BASOS: 0 %
EOS (ABSOLUTE): 0.3 10*3/uL (ref 0.0–0.4)
EOS ABS: 0.2 10*3/uL (ref 0.0–0.5)
EOS%: 2.4 % (ref 0.0–7.0)
EOS: 4 %
HEMATOCRIT: 26.9 % — AB (ref 37.5–51.0)
HEMATOCRIT: 26.7 % — AB (ref 38.4–49.9)
HEMOGLOBIN: 8.8 g/dL — AB (ref 12.6–17.7)
HGB: 8.6 g/dL — ABNORMAL LOW (ref 13.0–17.1)
IMMATURE GRANS (ABS): 0 10*3/uL (ref 0.0–0.1)
Immature Granulocytes: 0 %
LYMPH#: 0.4 10*3/uL — AB (ref 0.9–3.3)
LYMPH%: 5.5 % — AB (ref 14.0–49.0)
LYMPHS: 15 %
Lymphocytes Absolute: 1 10*3/uL (ref 0.7–3.1)
MCH: 28.7 pg (ref 26.6–33.0)
MCH: 28.6 pg (ref 27.2–33.4)
MCHC: 32.7 g/dL (ref 31.5–35.7)
MCHC: 32.2 g/dL (ref 32.0–36.0)
MCV: 88 fL (ref 79–97)
MCV: 89 fL (ref 79.3–98.0)
MONO#: 0.6 10*3/uL (ref 0.1–0.9)
MONO%: 8.9 % (ref 0.0–14.0)
MONOCYTES: 7 %
Monocytes Absolute: 0.4 10*3/uL (ref 0.1–0.9)
NEUT#: 5.7 10*3/uL (ref 1.5–6.5)
NEUT%: 82.9 % — AB (ref 39.0–75.0)
NEUTROS ABS: 4.6 10*3/uL (ref 1.4–7.0)
Neutrophils: 74 %
PLATELETS: 129 10*3/uL — AB (ref 140–400)
Platelets: 138 10*3/uL — ABNORMAL LOW (ref 150–379)
RBC: 3.07 x10E6/uL — ABNORMAL LOW (ref 4.14–5.80)
RBC: 3 10*6/uL — AB (ref 4.20–5.82)
RDW: 18.8 % — ABNORMAL HIGH (ref 12.3–15.4)
RDW: 20.2 % — ABNORMAL HIGH (ref 11.0–14.6)
WBC: 6.3 10*3/uL (ref 3.4–10.8)
WBC: 6.8 10*3/uL (ref 4.0–10.3)

## 2016-02-16 LAB — BASIC METABOLIC PANEL
BUN / CREAT RATIO: 11 (ref 10–24)
BUN: 13 mg/dL (ref 8–27)
CHLORIDE: 96 mmol/L (ref 96–106)
CO2: 25 mmol/L (ref 18–29)
CREATININE: 1.23 mg/dL (ref 0.76–1.27)
Calcium: 8.5 mg/dL — ABNORMAL LOW (ref 8.6–10.2)
GFR calc Af Amer: 66 mL/min/{1.73_m2} (ref >59)
GFR, EST NON AFRICAN AMERICAN: 57 mL/min/{1.73_m2} — AB (ref >59)
GLUCOSE: 103 mg/dL — AB (ref 65–99)
Potassium: 4.1 mmol/L (ref 3.5–5.2)
Sodium: 138 mmol/L (ref 134–144)

## 2016-02-16 LAB — COMPREHENSIVE METABOLIC PANEL
ALT: 26 U/L (ref 0–55)
ANION GAP: 8 meq/L (ref 3–11)
AST: 24 U/L (ref 5–34)
Albumin: 2.4 g/dL — ABNORMAL LOW (ref 3.5–5.0)
Alkaline Phosphatase: 98 U/L (ref 40–150)
BUN: 11.8 mg/dL (ref 7.0–26.0)
CALCIUM: 8.6 mg/dL (ref 8.4–10.4)
CHLORIDE: 98 meq/L (ref 98–109)
CO2: 27 meq/L (ref 22–29)
CREATININE: 1.2 mg/dL (ref 0.7–1.3)
EGFR: 57 mL/min/{1.73_m2} — AB (ref >90)
Glucose: 115 mg/dl (ref 70–140)
Potassium: 3.8 mEq/L (ref 3.5–5.1)
Sodium: 133 mEq/L — ABNORMAL LOW (ref 136–145)
Total Bilirubin: 0.53 mg/dL (ref 0.20–1.20)
Total Protein: 6.4 g/dL (ref 6.4–8.3)

## 2016-02-16 MED ORDER — ACETAMINOPHEN 325 MG PO TABS
ORAL_TABLET | ORAL | Status: AC
  Filled 2016-02-16: qty 2

## 2016-02-16 MED ORDER — ACETAMINOPHEN 325 MG PO TABS
650.0000 mg | ORAL_TABLET | Freq: Once | ORAL | Status: AC
  Administered 2016-02-16: 650 mg via ORAL

## 2016-02-16 MED ORDER — LEVOFLOXACIN 750 MG PO TABS: 750.0000 mg | ORAL_TABLET | Freq: Every day | ORAL | Status: DC

## 2016-02-16 NOTE — Telephone Encounter (Signed)
Oncology Nurse Navigator Documentation  Oncology Nurse Navigator Flowsheets 02/16/2016  Navigator Location [facility]  Treatment Phase Treatment  Barriers/Navigation Needs Coordination of Care  Interventions Coordination of Care  Coordination of Care Appts  Acuity Level 2  Time Spent with Patient 30   Patient called and is not feeling well.  I listened as he explained.  I updated Dr. Julien Nordmann.  He stated patient can be seen by Retta Mac NP.  I updated Jenny Reichmann and received orders.  I completed orders and urgent POF to be scheduled.  I called Paul Sandoval back and updated him.  He was thankful for the help.

## 2016-02-16 NOTE — Telephone Encounter (Signed)
spoke w/ pt confirmed apt today @ 2:30, adivsed pt her needs to get xrays done

## 2016-02-17 ENCOUNTER — Encounter: Payer: Self-pay | Admitting: Nurse Practitioner

## 2016-02-17 DIAGNOSIS — R600 Localized edema: Secondary | ICD-10-CM | POA: Insufficient documentation

## 2016-02-17 DIAGNOSIS — T451X5A Adverse effect of antineoplastic and immunosuppressive drugs, initial encounter: Secondary | ICD-10-CM | POA: Insufficient documentation

## 2016-02-17 DIAGNOSIS — R609 Edema, unspecified: Secondary | ICD-10-CM | POA: Insufficient documentation

## 2016-02-17 DIAGNOSIS — D6481 Anemia due to antineoplastic chemotherapy: Secondary | ICD-10-CM | POA: Insufficient documentation

## 2016-02-17 NOTE — Assessment & Plan Note (Signed)
Patient continues with some mild anemia post chemotherapy.  Hemoglobin slightly low at 8.6.  Patient denies any worsening issues with fatigue or shortness of breath.  Will continue to monitor closely.

## 2016-02-17 NOTE — Assessment & Plan Note (Addendum)
Patient was admitted to the hospital on 02/05/2016 and discharged from the hospital on 02/07/2016 with a diagnosis of pneumonia.  He was given IV antibiotics while he was in the hospital; and sent home with Levaquin 750 mg to take for a total of 3 days.  He states that he was feeling much better; but has noted an increase in his temperature up to maximum 100.1.  Since this past weekend.  He continues with a dry, nonproductive cough.  He denies any other new symptoms whatsoever.  Exam today reveals lungs clear bilaterally.  Patient is in no acute respiratory distress and O2 sat was 94% on room air.  Temperature on initial check in the cancer Center was 100.1.  Chest x-ray obtained today revealed continued pneumonia; but no worsening issues.  All details of today's visit, will review with Dr. Julien Nordmann as well.  Decision was made to extend patient's Levaquin antibiotics by another 5 days.  He was also advised to call/return or go directly to the emergency department for any worsening symptoms whatsoever.  Will continue to monitor closely.  Also, patient was given Tylenol while at the [place] for his fever.  Per patient's request.

## 2016-02-17 NOTE — Progress Notes (Signed)
SYMPTOM MANAGEMENT CLINIC    Chief Complaint: Fever, pneumonia  HPI:  Paul Sandoval 75 y.o. male diagnosed with lung cancer.  Patient is status post chemotherapy and radiation treatments.  Currently undergoing observation only.   Patient was admitted to the hospital on 02/05/2016 and discharged from the hospital on 02/07/2016 with a diagnosis of pneumonia.  He was given IV antibiotics while he was in the hospital; and sent home with Levaquin 750 mg to take for a total of 3 days.  He states that he was feeling much better; but has noted an increase in his temperature up to maximum 100.1.  Since this past weekend.  He continues with a dry, nonproductive cough.  He denies any other new symptoms whatsoever.  Exam today reveals lungs clear bilaterally.  Patient is in no acute respiratory distress and O2 sat was 94% on room air.  Temperature on initial check in the cancer Center was 100.1.  Chest x-ray obtained today revealed continued pneumonia; but no worsening issues.  All details of today's visit, will review with Dr. Julien Nordmann as well.  Decision was made to extend patient's Levaquin antibiotics by another 5 days.  He was also advised to call/return or go directly to the emergency department for any worsening symptoms whatsoever.  Will continue to monitor closely.  Oncology History   Patient presented to ED with hemoptysis.  Work up showed right lung mass.  Primary cancer of right upper lobe of lung ([place])   Staging form: Lung, AJCC 7th Edition     Clinical stage from 12/01/2015: Stage IIIA (T2b, N2, M0) - Signed by Curt Bears, MD on 12/01/2015       Primary cancer of right upper lobe of lung ([place])   11/11/2015 Imaging CTA . 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 This mass abuts the anterior visceral pleura along a broad base. No pleural effusion. 3. Right hilar and subcarinal, right paratracheal and lef   11/17/2015  Pathology Results BRONCHIAL WASHING (SPECIMEN 2 OF 2 COLLECTED 11-17-2015) MALIGNANT CELLS PRESENT, CONSISTENT WITH NON-SMALL CELL CARCINOMA   11/17/2015 Surgery Bronchoscopy   11/24/2015 Initial Diagnosis Primary cancer of right upper lobe of lung ([place])   11/25/2015 Imaging PET IMPRESSION: 1. Hypermetabolic RIGHT upper lobe mass abutting the pleural surface consists with bronchogenic carcinoma. 2. Hypermetabolic RIGHT hilar and RIGHT lower paratracheal metastatic adenopathy.   12/02/2015 -  Radiation Therapy SIM   12/08/2015 Imaging MRI Brain No metastatic disease is observed   12/12/2015 -  Chemotherapy 1st chemotherapy Taxol/Carbo    Review of Systems  Constitutional: Positive for fever and chills.  Respiratory: Positive for cough.   All other systems reviewed and are negative.   Past Medical History  Diagnosis Date  . 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 AAA (abdominal aortic aneurysm) ([place]) 2010    4.4 cm 08/2008;4.44 in 7/10 and 4.65 in 08/2009; 4.8 by CT in 11/2009; 4.3 by ultrasound in 08/2010  . Hypertension   . Hyperlipidemia   . Tobacco abuse, in remission     40 pack year total consumption; discontinued in 1996  . GERD (gastroesophageal reflux disease)   . Right bundle branch block   . Diverticulosis   . Colonic polyp 2002    polypectomy in 2002  . COPD (chronic obstructive pulmonary disease) ([place])   . CAD (coronary artery disease)     03/18/14:  PCI with DES to distal  left main. 7/29: DES to the SVG to Diag  . ED (erectile dysfunction)   . IFG (impaired fasting glucose)   . Chronic rhinitis   . Myocardial infarction [facility])     "told h/o silent MI sometime before 1996"  . Pneumonia ~ 2001; ~ 2005  . Chronic bronchitis ([place])   . Arthritis     "fingers" (03/18/2014)  . Cardiomyopathy, ischemic     Echo 03/17/14: EF 45-50%  . Encounter for antineoplastic chemotherapy 12/19/2015  . Cancer [facility])     Upper right lobe lung cancer    Past Surgical  History  Procedure Laterality Date  . Colonoscopy  2002    polypectomy-patient denies  . Laparoscopic cholecystectomy  12/2009  . Abdominal aortic endovascular stent graft N/A 12/11/2012    Procedure: ABDOMINAL AORTIC ENDOVASCULAR STENT GRAFT;  Surgeon: Mal Misty, MD;  Location: [place];  Service: Vascular;  Laterality: N/A;  Ultrasound guided; [place]  . Abdominal aortic aneurysm repair  11/2012  . Total hip arthroplasty Left 01/21/2013    Procedure: TOTAL HIP ARTHROPLASTY ANTERIOR APPROACH;  Surgeon: Mauri Pole, MD;  Location: [place];  Service: Orthopedics;  Laterality: Left;  . Joint replacement    . Coronary artery bypass graft  01/09/1995    "CABG X3"  . Cardiac catheterization  01/08/1995  . Coronary angioplasty with stent placement  03/18/2014    "1"  . Coronary angioplasty with stent placement  03/24/2014    "1"  . Left and right heart catheterization with coronary/graft angiogram N/A 03/18/2014    Procedure: LEFT AND RIGHT HEART CATHETERIZATION WITH Beatrix Fetters;  Surgeon: Blane Ohara, MD;  Location: [facility] CATH LAB;  Service: Cardiovascular;  Laterality: N/A;  . Percutaneous coronary stent intervention (pci-s)  03/18/2014    Procedure: PERCUTANEOUS CORONARY STENT INTERVENTION (PCI-S);  Surgeon: Blane Ohara, MD;  Location: [facility] CATH LAB;  Service: Cardiovascular;;  . Percutaneous coronary stent intervention (pci-s) N/A 03/24/2014    Procedure: PERCUTANEOUS CORONARY STENT INTERVENTION (PCI-S);  Surgeon: Blane Ohara, MD;  Location: [facility] CATH LAB;  Service: Cardiovascular;  Laterality: N/A;  . 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    is allergic to neomycin.    Medication List       This list is accurate as of: 02/16/16 11:59 PM.  Always use your most recent med list.               acetaminophen 500 MG tablet  Commonly known as:  TYLENOL  Take 500 mg by mouth every 6 (six) hours as needed for mild pain.     CENTRUM SILVER ADULT 50+ Tabs  Take 1 tablet by mouth daily.     clopidogrel 75 MG tablet  Commonly known as:  PLAVIX  Take 1 tablet (75 mg total) by mouth daily. Reported on 12/02/2015     docusate sodium 100 MG capsule  Commonly known as:  COLACE  Take 100 mg by mouth daily as needed for mild constipation or moderate constipation.     fluticasone furoate-vilanterol 100-25 MCG/INH Aepb  Commonly known as:  BREO ELLIPTA  Inhale 1 puff into the lungs daily.     Iron 28 MG Tabs  Take 28 mg by mouth daily.     levofloxacin 750 MG tablet  Commonly known as:  LEVAQUIN  Take 1 tablet (750 mg total) by mouth daily.     metoprolol 50 MG tablet  Commonly known as:  LOPRESSOR  Take 50 mg by mouth 2 (two) times daily.     nitroGLYCERIN 0.4 MG SL tablet  Commonly known as:  NITROSTAT  Place 1 tablet (0.4 mg total) under the tongue every 5 (five) minutes as needed for chest pain.     omeprazole 20 MG capsule  Commonly known as:  PRILOSEC  TAKE ONE CAPSULE BY MOUTH DAILY.     prochlorperazine 10 MG tablet    Commonly known as:  COMPAZINE  TAKE (1) TABLET BY MOUTH EVERY SIX HOURS AS NEEDED FOR NAUSEA AND VOMITING.     simvastatin 80 MG tablet  Commonly known as:  ZOCOR  Take 0.5 tablets (40 mg total) by mouth at bedtime.     sodium chloride 1 g tablet  Take 2 tablets (2 g total) by mouth 2 (two) times daily with a meal.     sucralfate 1 g tablet  Commonly known as:  CARAFATE  Dissolve in 8 oz water and drink 5 minutes prior to meals and at bedtime     tiotropium 18 MCG inhalation capsule  Commonly known as:  SPIRIVA  Place 18 mcg into inhaler and inhale daily.     VENTOLIN HFA 108 (90 Base) MCG/ACT inhaler  Generic drug:  albuterol  INHALE 2 PUFFS BY MOUTH EVERY 4 TO 6 HOURS AS NEEDED FOR WHEEZING.         PHYSICAL EXAMINATION  Oncology Vitals 02/16/2016 02/16/2016  Height - 178 cm  Weight - 87.499 kg  Weight (lbs) - 192 lbs 14 oz  BMI (kg/m2) - 27.68 kg/m2  Temp 98.5 100  Pulse - 114  Resp - 18  SpO2 - 94  BSA (m2) - 2.08 m2   BP Readings from Last 2 Encounters:  02/16/16 151/62  02/09/16 112/62    Physical Exam  Constitutional: He is oriented to person, place, and time and well-developed, well-nourished, and in no distress.  HENT:  Head: Normocephalic and atraumatic.  Mouth/Throat: Oropharynx is clear and moist.  Eyes: Conjunctivae and EOM are normal. Pupils are equal, round, and reactive to light. Right eye exhibits no discharge. Left eye exhibits no discharge. No scleral icterus.  Neck: Normal range of motion. Neck supple. No JVD present. No tracheal deviation present. No thyromegaly present.  Cardiovascular: Normal rate, regular rhythm, normal heart sounds and intact distal pulses.   Pulmonary/Chest: Effort normal and breath sounds normal. No respiratory distress. He has no wheezes. He has no rales. He exhibits no tenderness.  Occasional dry cough only.  Abdominal: Soft. Bowel sounds are normal. He exhibits no distension and no mass. There is no tenderness. There  is no rebound and no guarding.  Musculoskeletal: Normal range of motion. He exhibits edema. He exhibits no tenderness.  +1 edema to bilateral feet  Lymphadenopathy:    He  has no cervical adenopathy.  Neurological: He is alert and oriented to person, place, and time. Gait normal.  Skin: Skin is warm and dry. No rash noted. No erythema. No pallor.  Psychiatric: Affect normal.  Nursing note and vitals reviewed.   LABORATORY DATA:. Appointment on 02/16/2016  Component Date Value Ref Range Status  . WBC 02/16/2016 6.8  4.0 - 10.3 10e3/uL Final  . NEUT# 02/16/2016 5.7  1.5 - 6.5 10e3/uL Final  . HGB 02/16/2016 8.6* 13.0 - 17.1 g/dL Final  . HCT 02/16/2016 26.7* 38.4 - 49.9 % Final  . Platelets 02/16/2016 129* 140 - 400 10e3/uL Final  . MCV 02/16/2016 89.0  79.3 - 98.0 fL Final  . MCH 02/16/2016 28.6  27.2 - 33.4 pg Final  . MCHC 02/16/2016 32.2  32.0 - 36.0 g/dL Final  . RBC 02/16/2016 3.00* 4.20 - 5.82 10e6/uL Final  . RDW 02/16/2016 20.2* 11.0 - 14.6 % Final  . lymph# 02/16/2016 0.4* 0.9 - 3.3 10e3/uL Final  . MONO# 02/16/2016 0.6  0.1 - 0.9 10e3/uL Final  . Eosinophils Absolute 02/16/2016 0.2  0.0 - 0.5 10e3/uL Final  . Basophils Absolute 02/16/2016 0.0  0.0 - 0.1 10e3/uL Final  . NEUT% 02/16/2016 82.9* 39.0 - 75.0 % Final  . LYMPH% 02/16/2016 5.5* 14.0 - 49.0 % Final  . MONO% 02/16/2016 8.9  0.0 - 14.0 % Final  . EOS% 02/16/2016 2.4  0.0 - 7.0 % Final  . BASO% 02/16/2016 0.3  0.0 - 2.0 % Final  . Sodium 02/16/2016 133* 136 - 145 mEq/L Final  . Potassium 02/16/2016 3.8  3.5 - 5.1 mEq/L Final  . Chloride 02/16/2016 98  98 - 109 mEq/L Final  . CO2 02/16/2016 27  22 - 29 mEq/L Final  . Glucose 02/16/2016 115  70 - 140 mg/dl Final   Glucose reference range is for nonfasting patients. Fasting glucose reference range is 70- 100.  Marland Kitchen BUN 02/16/2016 11.8  7.0 - 26.0 mg/dL Final  . Creatinine 02/16/2016 1.2  0.7 - 1.3 mg/dL Final  . Total Bilirubin 02/16/2016 0.53  0.20 - 1.20 mg/dL Final   . Alkaline Phosphatase 02/16/2016 98  40 - 150 U/L Final  . AST 02/16/2016 24  5 - 34 U/L Final  . ALT 02/16/2016 26  0 - 55 U/L Final  . Total Protein 02/16/2016 6.4  6.4 - 8.3 g/dL Final  . Albumin 02/16/2016 2.4* 3.5 - 5.0 g/dL Final  . Calcium 02/16/2016 8.6  8.4 - 10.4 mg/dL Final  . Anion Gap 02/16/2016 8  3 - 11 mEq/L Final  . EGFR 02/16/2016 57* >90 ml/min/1.73 m2 Final   eGFR is calculated using the CKD-EPI Creatinine Equation (2009)    RADIOGRAPHIC STUDIES: Dg Chest 2 View  02/16/2016  CLINICAL DATA:  Shortness of breath with exertion, fever, cough EXAM: CHEST  2 VIEW COMPARISON:  Chest x-ray of 02/05/2016 FINDINGS: There is parenchymal opacity within the right upper lobe consistent with pneumonia. The left lung is clear. Mediastinal hilar contours are unremarkable. Followup PA and lateral chest X-ray is recommended in 3-4 weeks following trial of antibiotic therapy to ensure resolution and exclude underlying malignancy. The heart is within normal limits in size. No bony abnormality is seen. Median sternotomy sutures are noted from CABG. IMPRESSION: Right upper lobe opacity most consistent with pneumonia. Followup PA and lateral chest X-ray is recommended in 3-4 weeks following trial of antibiotic therapy to ensure resolution and exclude underlying malignancy. Electronically Signed   By: Eddie Dibbles  Alvester Chou M.D.   On: 02/16/2016 15:19    ASSESSMENT/PLAN:    Primary cancer of right upper lobe of lung [facility]) Patient is status post both chemotherapy and radiation treatments.  He is currently undergoing observation only.  See further notes for details of today's visit.  Patient is scheduled for labs and a restaging CT on 03/05/2016.  He is scheduled for follow-up visit on 03/13/2016.  Peripheral edema Patient was recently admitted to the hospital with a diagnosis of pneumonia.  Upon discharge from the hospital.  He was instructed to hold both the HCTZ and enalapril. Pt has continued taking  the lopressor as directed.   Since that time, patient has been noting some increased bilateral lower extremity edema to his feet only.  He denies any worsening shortness of breath whatsoever.  Patient was advised to continue elevating his feet above the level of his heart when at rest; and to continue to remain as active as possible as well.  He could also consider trying over-the-counter compression stockings to see if this helps.  Recommended that patient follow up with his primary care physician Dr. Wolfgang Phoenix in regards to the resumption of his other blood pressure medications.  He was also advised to start a blood pressure log on an everyday basis to present to his primary care physician as well.    Hyponatremia Pt had significant hyponatremia while admitted to the hospital; and was initiated on salt tablets at that time.  He has continued to take the salt tablet since he has been home from the hospital; states he only has 2 more salt tablets to take which will be completed today.  Patient states he has noticed that he has had increased edema to his bilateral feet since started taking the salt tablets.  He is also discontinued both the HCTZ and enalapril per instructions of the hospitalist.  Sodium today was greatly improved at 133.  Patient will complete his last doses of salt tablets today; and then will continue to monitor patient's sodium closely.  HCAP (healthcare-associated pneumonia) Patient was admitted to the hospital on 02/05/2016 and discharged from the hospital on 02/07/2016 with a diagnosis of pneumonia.  He was given IV antibiotics while he was in the hospital; and sent home with Levaquin 750 mg to take for a total of 3 days.  He states that he was feeling much better; but has noted an increase in his temperature up to maximum 100.1.  Since this past weekend.  He continues with a dry, nonproductive cough.  He denies any other new symptoms whatsoever.  Exam today reveals lungs clear  bilaterally.  Patient is in no acute respiratory distress and O2 sat was 94% on room air.  Temperature on initial check in the cancer Center was 100.1.  Chest x-ray obtained today revealed continued pneumonia; but no worsening issues.  All details of today's visit, will review with Dr. Julien Nordmann as well.  Decision was made to extend patient's Levaquin antibiotics by another 5 days.  He was also advised to call/return or go directly to the emergency department for any worsening symptoms whatsoever.  Will continue to monitor closely.  Also, patient was given Tylenol while at the [facility] for his fever.  Per patient's request.  Anemia due to antineoplastic chemotherapy Patient continues with some mild anemia post chemotherapy.  Hemoglobin slightly low at 8.6.  Patient denies any worsening issues with fatigue or shortness of breath.  Will continue to monitor closely.    Patient stated  understanding of all instructions; and was in agreement with this plan of care. The patient knows to call the clinic with any problems, questions or concerns.   Total time spent with patient was 25 minutes;  with greater than 75 percent of that time spent in face to face counseling regarding patient's symptoms,  and coordination of care and follow up.  Disclaimer:This dictation was prepared with Dragon/digital dictation along with [employer]. Any transcriptional errors that result from this process are unintentional.  Drue Second, NP 02/17/2016   ADDENDUM: Hematology/Oncology Attending:  I had a face to face encounter with the patient. I recommended his care plan. This is a very pleasant 75 years old white male with a stage IIIa non-small cell lung cancer status post a course of concurrent chemoradiation with weekly carboplatin and paclitaxel and he is currently on observation. The patient was recently admitted to [facility] with questionable pneumonia and treated with a course of  antibiotics. He continues to have low-grade fever. Chest x-ray performed earlier today showed no worsening of his previous pneumonia. I recommended for the patient to continue with Levaquin for 5 more days. He would come back for follow-up visit as previously scheduled with repeat CT scan of the chest for restaging of his disease. The patient was advised to call immediately or go to the emergency home if he has worsening of his condition.  Disclaimer: This note was dictated with voice recognition software. Similar sounding words can inadvertently be transcribed and may be missed upon review. Eilleen Kempf., MD 02/18/2016

## 2016-02-17 NOTE — Assessment & Plan Note (Addendum)
Patient was recently admitted to the hospital with a diagnosis of pneumonia.  Upon discharge from the hospital.  He was instructed to hold both the HCTZ and enalapril. Pt has continued taking the lopressor as directed.   Since that time, patient has been noting some increased bilateral lower extremity edema to his feet only.  He denies any worsening shortness of breath whatsoever.  Patient was advised to continue elevating his feet above the level of his heart when at rest; and to continue to remain as active as possible as well.  He could also consider trying over-the-counter compression stockings to see if this helps.  Recommended that patient follow up with his primary care physician Dr. Wolfgang Phoenix in regards to the resumption of his other blood pressure medications.  He was also advised to start a blood pressure log on an everyday basis to present to his primary care physician as well.

## 2016-02-17 NOTE — Assessment & Plan Note (Signed)
Patient is status post both chemotherapy and radiation treatments.  He is currently undergoing observation only.  See further notes for details of today's visit.  Patient is scheduled for labs and a restaging CT on 03/05/2016.  He is scheduled for follow-up visit on 03/13/2016.

## 2016-02-17 NOTE — Assessment & Plan Note (Signed)
Pt had significant hyponatremia while admitted to the hospital; and was initiated on salt tablets at that time.  He has continued to take the salt tablet since he has been home from the hospital; states he only has 2 more salt tablets to take which will be completed today.  Patient states he has noticed that he has had increased edema to his bilateral feet since started taking the salt tablets.  He is also discontinued both the HCTZ and enalapril per instructions of the hospitalist.  Sodium today was greatly improved at 133.  Patient will complete his last doses of salt tablets today; and then will continue to monitor patient's sodium closely.

## 2016-02-21 ENCOUNTER — Ambulatory Visit (HOSPITAL_COMMUNITY)
Admission: RE | Admit: 2016-02-21 | Discharge: 2016-02-21 | Disposition: A | Discharge status: Home / Self Care | Payer: Medicare Other | Source: Ambulatory Visit | Attending: Nurse Practitioner | Admitting: Nurse Practitioner

## 2016-02-21 ENCOUNTER — Other Ambulatory Visit: Payer: Self-pay | Admitting: Nurse Practitioner

## 2016-02-21 ENCOUNTER — Ambulatory Visit (HOSPITAL_BASED_OUTPATIENT_CLINIC_OR_DEPARTMENT_OTHER): Payer: Medicare Other | Admitting: Nurse Practitioner

## 2016-02-21 ENCOUNTER — Encounter (HOSPITAL_COMMUNITY): Payer: Self-pay

## 2016-02-21 ENCOUNTER — Encounter: Payer: Self-pay | Admitting: Nurse Practitioner

## 2016-02-21 ENCOUNTER — Telehealth: Payer: Self-pay | Admitting: Nurse Practitioner

## 2016-02-21 ENCOUNTER — Ambulatory Visit: Payer: Medicare Other | Admitting: Family Medicine

## 2016-02-21 ENCOUNTER — Other Ambulatory Visit (HOSPITAL_BASED_OUTPATIENT_CLINIC_OR_DEPARTMENT_OTHER): Payer: Medicare Other

## 2016-02-21 ENCOUNTER — Telehealth: Payer: Self-pay | Admitting: *Deleted

## 2016-02-21 ENCOUNTER — Telehealth: Payer: Self-pay | Admitting: Family Medicine

## 2016-02-21 ENCOUNTER — Observation Stay (HOSPITAL_COMMUNITY): Payer: Medicare Other

## 2016-02-21 ENCOUNTER — Observation Stay (HOSPITAL_COMMUNITY)
Admission: AD | Admit: 2016-02-21 | Discharge: 2016-02-25 | Disposition: A | Discharge status: Home / Self Care | Payer: Medicare Other | Source: Ambulatory Visit | Attending: Internal Medicine | Admitting: Internal Medicine

## 2016-02-21 VITALS — BP 132/63 | HR 103 | Temp 98.1°F | Resp 18 | Ht 70.0 in | Wt 188.0 lb

## 2016-02-21 DIAGNOSIS — D6959 Other secondary thrombocytopenia: Secondary | ICD-10-CM | POA: Diagnosis not present

## 2016-02-21 DIAGNOSIS — N183 Chronic kidney disease, stage 3 unspecified: Secondary | ICD-10-CM | POA: Diagnosis present

## 2016-02-21 DIAGNOSIS — I255 Ischemic cardiomyopathy: Secondary | ICD-10-CM | POA: Insufficient documentation

## 2016-02-21 DIAGNOSIS — E785 Hyperlipidemia, unspecified: Secondary | ICD-10-CM | POA: Diagnosis not present

## 2016-02-21 DIAGNOSIS — D6481 Anemia due to antineoplastic chemotherapy: Secondary | ICD-10-CM | POA: Diagnosis not present

## 2016-02-21 DIAGNOSIS — Z955 Presence of coronary angioplasty implant and graft: Secondary | ICD-10-CM | POA: Insufficient documentation

## 2016-02-21 DIAGNOSIS — K219 Gastro-esophageal reflux disease without esophagitis: Secondary | ICD-10-CM | POA: Insufficient documentation

## 2016-02-21 DIAGNOSIS — E46 Unspecified protein-calorie malnutrition: Secondary | ICD-10-CM | POA: Insufficient documentation

## 2016-02-21 DIAGNOSIS — E871 Hypo-osmolality and hyponatremia: Secondary | ICD-10-CM

## 2016-02-21 DIAGNOSIS — I130 Hypertensive heart and chronic kidney disease with heart failure and stage 1 through stage 4 chronic kidney disease, or unspecified chronic kidney disease: Secondary | ICD-10-CM | POA: Insufficient documentation

## 2016-02-21 DIAGNOSIS — Z951 Presence of aortocoronary bypass graft: Secondary | ICD-10-CM | POA: Insufficient documentation

## 2016-02-21 DIAGNOSIS — Z923 Personal history of irradiation: Secondary | ICD-10-CM | POA: Diagnosis not present

## 2016-02-21 DIAGNOSIS — I2583 Coronary atherosclerosis due to lipid rich plaque: Secondary | ICD-10-CM | POA: Diagnosis not present

## 2016-02-21 DIAGNOSIS — I4510 Unspecified right bundle-branch block: Secondary | ICD-10-CM | POA: Diagnosis not present

## 2016-02-21 DIAGNOSIS — R0602 Shortness of breath: Secondary | ICD-10-CM | POA: Diagnosis not present

## 2016-02-21 DIAGNOSIS — Z809 Family history of malignant neoplasm, unspecified: Secondary | ICD-10-CM | POA: Insufficient documentation

## 2016-02-21 DIAGNOSIS — Z8261 Family history of arthritis: Secondary | ICD-10-CM | POA: Insufficient documentation

## 2016-02-21 DIAGNOSIS — J189 Pneumonia, unspecified organism: Secondary | ICD-10-CM | POA: Insufficient documentation

## 2016-02-21 DIAGNOSIS — R509 Fever, unspecified: Secondary | ICD-10-CM | POA: Diagnosis present

## 2016-02-21 DIAGNOSIS — R0601 Orthopnea: Secondary | ICD-10-CM | POA: Insufficient documentation

## 2016-02-21 DIAGNOSIS — C3490 Malignant neoplasm of unspecified part of unspecified bronchus or lung: Secondary | ICD-10-CM

## 2016-02-21 DIAGNOSIS — N400 Enlarged prostate without lower urinary tract symptoms: Secondary | ICD-10-CM | POA: Insufficient documentation

## 2016-02-21 DIAGNOSIS — Z9221 Personal history of antineoplastic chemotherapy: Secondary | ICD-10-CM | POA: Insufficient documentation

## 2016-02-21 DIAGNOSIS — I252 Old myocardial infarction: Secondary | ICD-10-CM | POA: Diagnosis not present

## 2016-02-21 DIAGNOSIS — I739 Peripheral vascular disease, unspecified: Secondary | ICD-10-CM | POA: Diagnosis not present

## 2016-02-21 DIAGNOSIS — Z87891 Personal history of nicotine dependence: Secondary | ICD-10-CM | POA: Insufficient documentation

## 2016-02-21 DIAGNOSIS — C3411 Malignant neoplasm of upper lobe, right bronchus or lung: Secondary | ICD-10-CM | POA: Diagnosis not present

## 2016-02-21 DIAGNOSIS — I714 Abdominal aortic aneurysm, without rupture: Secondary | ICD-10-CM | POA: Diagnosis not present

## 2016-02-21 DIAGNOSIS — Z9049 Acquired absence of other specified parts of digestive tract: Secondary | ICD-10-CM | POA: Insufficient documentation

## 2016-02-21 DIAGNOSIS — I5043 Acute on chronic combined systolic (congestive) and diastolic (congestive) heart failure: Secondary | ICD-10-CM | POA: Insufficient documentation

## 2016-02-21 DIAGNOSIS — K449 Diaphragmatic hernia without obstruction or gangrene: Secondary | ICD-10-CM | POA: Insufficient documentation

## 2016-02-21 DIAGNOSIS — I081 Rheumatic disorders of both mitral and tricuspid valves: Secondary | ICD-10-CM | POA: Insufficient documentation

## 2016-02-21 DIAGNOSIS — T451X5A Adverse effect of antineoplastic and immunosuppressive drugs, initial encounter: Secondary | ICD-10-CM

## 2016-02-21 DIAGNOSIS — J90 Pleural effusion, not elsewhere classified: Secondary | ICD-10-CM | POA: Diagnosis not present

## 2016-02-21 DIAGNOSIS — Z86010 Personal history of colonic polyps: Secondary | ICD-10-CM | POA: Insufficient documentation

## 2016-02-21 DIAGNOSIS — J449 Chronic obstructive pulmonary disease, unspecified: Secondary | ICD-10-CM | POA: Diagnosis not present

## 2016-02-21 DIAGNOSIS — I2510 Atherosclerotic heart disease of native coronary artery without angina pectoris: Secondary | ICD-10-CM | POA: Insufficient documentation

## 2016-02-21 DIAGNOSIS — Z8249 Family history of ischemic heart disease and other diseases of the circulatory system: Secondary | ICD-10-CM | POA: Insufficient documentation

## 2016-02-21 DIAGNOSIS — I378 Other nonrheumatic pulmonary valve disorders: Secondary | ICD-10-CM | POA: Insufficient documentation

## 2016-02-21 DIAGNOSIS — R000 Tachycardia, unspecified: Secondary | ICD-10-CM | POA: Insufficient documentation

## 2016-02-21 DIAGNOSIS — Z7982 Long term (current) use of aspirin: Secondary | ICD-10-CM | POA: Insufficient documentation

## 2016-02-21 DIAGNOSIS — N1832 Chronic kidney disease, stage 3b: Secondary | ICD-10-CM | POA: Diagnosis present

## 2016-02-21 DIAGNOSIS — K5730 Diverticulosis of large intestine without perforation or abscess without bleeding: Secondary | ICD-10-CM | POA: Diagnosis not present

## 2016-02-21 DIAGNOSIS — Z8679 Personal history of other diseases of the circulatory system: Secondary | ICD-10-CM | POA: Insufficient documentation

## 2016-02-21 DIAGNOSIS — E8809 Other disorders of plasma-protein metabolism, not elsewhere classified: Secondary | ICD-10-CM

## 2016-02-21 DIAGNOSIS — Z96642 Presence of left artificial hip joint: Secondary | ICD-10-CM | POA: Insufficient documentation

## 2016-02-21 DIAGNOSIS — Z881 Allergy status to other antibiotic agents status: Secondary | ICD-10-CM | POA: Insufficient documentation

## 2016-02-21 DIAGNOSIS — Z79899 Other long term (current) drug therapy: Secondary | ICD-10-CM | POA: Insufficient documentation

## 2016-02-21 DIAGNOSIS — Z820 Family history of epilepsy and other diseases of the nervous system: Secondary | ICD-10-CM | POA: Insufficient documentation

## 2016-02-21 LAB — COMPREHENSIVE METABOLIC PANEL
ALT: 17 U/L (ref 0–55)
AST: 22 U/L (ref 5–34)
Albumin: 2.3 g/dL — ABNORMAL LOW (ref 3.5–5.0)
Alkaline Phosphatase: 93 U/L (ref 40–150)
Anion Gap: 8 mEq/L (ref 3–11)
BUN: 10.2 mg/dL (ref 7.0–26.0)
CHLORIDE: 97 meq/L — AB (ref 98–109)
CO2: 28 mEq/L (ref 22–29)
Calcium: 9.1 mg/dL (ref 8.4–10.4)
Creatinine: 1.1 mg/dL (ref 0.7–1.3)
EGFR: 63 mL/min/{1.73_m2} — AB (ref >90)
GLUCOSE: 83 mg/dL (ref 70–140)
POTASSIUM: 4.6 meq/L (ref 3.5–5.1)
SODIUM: 134 meq/L — AB (ref 136–145)
Total Bilirubin: 0.45 mg/dL (ref 0.20–1.20)
Total Protein: 6.7 g/dL (ref 6.4–8.3)

## 2016-02-21 LAB — CBC WITH DIFFERENTIAL/PLATELET
BASO%: 0.3 % (ref 0.0–2.0)
BASOS ABS: 0 10*3/uL (ref 0.0–0.1)
EOS%: 2.5 % (ref 0.0–7.0)
Eosinophils Absolute: 0.2 10*3/uL (ref 0.0–0.5)
HCT: 27.6 % — ABNORMAL LOW (ref 38.4–49.9)
HGB: 8.8 g/dL — ABNORMAL LOW (ref 13.0–17.1)
LYMPH#: 0.7 10*3/uL — AB (ref 0.9–3.3)
LYMPH%: 9.7 % — AB (ref 14.0–49.0)
MCH: 28.6 pg (ref 27.2–33.4)
MCHC: 31.9 g/dL — AB (ref 32.0–36.0)
MCV: 89.6 fL (ref 79.3–98.0)
MONO#: 0.7 10*3/uL (ref 0.1–0.9)
MONO%: 10.5 % (ref 0.0–14.0)
NEUT#: 5.5 10*3/uL (ref 1.5–6.5)
NEUT%: 77 % — AB (ref 39.0–75.0)
Platelets: 126 10*3/uL — ABNORMAL LOW (ref 140–400)
RBC: 3.08 10*6/uL — AB (ref 4.20–5.82)
RDW: 18.4 % — AB (ref 11.0–14.6)
WBC: 7.1 10*3/uL (ref 4.0–10.3)

## 2016-02-21 LAB — PROCALCITONIN

## 2016-02-21 LAB — BRAIN NATRIURETIC PEPTIDE: B Natriuretic Peptide: 942.6 pg/mL — ABNORMAL HIGH (ref 0.0–100.0)

## 2016-02-21 MED ORDER — ASPIRIN EC 81 MG PO TBEC
81.0000 mg | DELAYED_RELEASE_TABLET | Freq: Every day | ORAL | Status: DC
  Administered 2016-02-22 – 2016-02-25 (×4): 81 mg via ORAL
  Filled 2016-02-21 (×4): qty 1

## 2016-02-21 MED ORDER — PANTOPRAZOLE SODIUM 40 MG PO TBEC
40.0000 mg | DELAYED_RELEASE_TABLET | Freq: Every day | ORAL | Status: DC
  Administered 2016-02-22 – 2016-02-25 (×4): 40 mg via ORAL
  Filled 2016-02-21 (×4): qty 1

## 2016-02-21 MED ORDER — DEXTROSE 5 % IV SOLN: 2.0000 g | Freq: Three times a day (TID) | INTRAVENOUS | Status: DC

## 2016-02-21 MED ORDER — DEXTROSE 5 % IV SOLN
2.0000 g | INTRAVENOUS | Status: DC
  Filled 2016-02-21: qty 2

## 2016-02-21 MED ORDER — ATORVASTATIN CALCIUM 40 MG PO TABS
40.0000 mg | ORAL_TABLET | Freq: Every day | ORAL | Status: DC
  Administered 2016-02-21 – 2016-02-24 (×4): 40 mg via ORAL
  Filled 2016-02-21 (×4): qty 1

## 2016-02-21 MED ORDER — ACETAMINOPHEN 325 MG PO TABS: 650.0000 mg | ORAL_TABLET | Freq: Four times a day (QID) | ORAL | Status: DC | PRN

## 2016-02-21 MED ORDER — IOPAMIDOL (ISOVUE-300) INJECTION 61%
75.0000 mL | Freq: Once | INTRAVENOUS | Status: AC | PRN
  Administered 2016-02-21: 75 mL via INTRAVENOUS

## 2016-02-21 MED ORDER — DOCUSATE SODIUM 100 MG PO CAPS: 100.0000 mg | ORAL_CAPSULE | Freq: Every day | ORAL | Status: DC | PRN

## 2016-02-21 MED ORDER — ENOXAPARIN SODIUM 40 MG/0.4ML ~~LOC~~ SOLN
40.0000 mg | SUBCUTANEOUS | Status: DC
  Administered 2016-02-21 – 2016-02-24 (×4): 40 mg via SUBCUTANEOUS
  Filled 2016-02-21 (×4): qty 0.4

## 2016-02-21 MED ORDER — METOPROLOL TARTRATE 50 MG PO TABS
50.0000 mg | ORAL_TABLET | Freq: Two times a day (BID) | ORAL | Status: DC
Start: 2016-02-21 — End: 2016-02-24
  Administered 2016-02-21 – 2016-02-24 (×6): 50 mg via ORAL
  Filled 2016-02-21 (×6): qty 1

## 2016-02-21 MED ORDER — CLOPIDOGREL BISULFATE 75 MG PO TABS
75.0000 mg | ORAL_TABLET | Freq: Every day | ORAL | Status: DC
  Administered 2016-02-22 – 2016-02-25 (×4): 75 mg via ORAL
  Filled 2016-02-21 (×4): qty 1

## 2016-02-21 MED ORDER — FLUTICASONE FUROATE-VILANTEROL 100-25 MCG/INH IN AEPB
1.0000 | INHALATION_SPRAY | Freq: Every day | RESPIRATORY_TRACT | Status: DC
  Administered 2016-02-22 – 2016-02-24 (×3): 1 via RESPIRATORY_TRACT
  Filled 2016-02-21: qty 28

## 2016-02-21 MED ORDER — ALBUTEROL SULFATE (2.5 MG/3ML) 0.083% IN NEBU: 2.5000 mg | INHALATION_SOLUTION | RESPIRATORY_TRACT | Status: DC | PRN

## 2016-02-21 MED ORDER — ACETAMINOPHEN 500 MG PO TABS: 500.0000 mg | ORAL_TABLET | Freq: Four times a day (QID) | ORAL | Status: DC | PRN

## 2016-02-21 MED ORDER — FERROUS SULFATE 325 (65 FE) MG PO TABS
325.0000 mg | ORAL_TABLET | Freq: Every day | ORAL | Status: DC
  Administered 2016-02-22 – 2016-02-25 (×4): 325 mg via ORAL
  Filled 2016-02-21 (×4): qty 1

## 2016-02-21 MED ORDER — ACETAMINOPHEN 650 MG RE SUPP: 650.0000 mg | Freq: Four times a day (QID) | RECTAL | Status: DC | PRN

## 2016-02-21 NOTE — Telephone Encounter (Signed)
Collaborated with [facility] nurse.  Instructed patient to have CXR, lab and will be seen in [facility].  Says he'll get dressed and it takes 40 minutes to drive here but will get started now.

## 2016-02-21 NOTE — Telephone Encounter (Signed)
[facility] apt sched next available, pt on way

## 2016-02-21 NOTE — Assessment & Plan Note (Addendum)
Patient was admitted to the hospital on 02/05/2016 and discharge from hospital on 02/07/2016 with a diagnosis of pneumonia.  He received IV antibiotics in the hospital; and was discharged with 3 days of Levaquin 750 mg per day.  He continues to experience some chronic cough; and intermittent fever.  He was seen last week in the cancer Center symptom management clinic for the symptoms.  He denied any chest pain, chest pressure, or pain with inspiration.  Chest x-ray that time revealed stable but continued pneumonia.  Patient was prescribed an additional Levaquin 750 mg on a daily basis for a total of 5 more days.  Patient presented back to the [place] today with complaint of increased shortness of breath, continued, congested cough, and increased fatigue.  He also states that he continues with intermittent fevers to a maximum of 100.1.  He states that he had to sleep sitting up at a 45 angle last night due to shortness of breath.  He continues to report no chest pain, chest pressure, or pain with inspiration.  Exam today reveals breath sounds essentially clear bilaterally; in no acute respiratory distress.  Patient does not appear short of breath; but does continue with an occasional dry cough.  Vital signs were essentially stable today; and patient was afebrile.  O2 sat was 97% room air.  Chest x-ray obtained today revealed progressive pneumonia and a mild right pleural effusion.  Reviewed all findings with Dr. Irene Limbo call physician; who agreed that patient should be admitted to the hospital due to his progressive pneumonia and failed outpatient therapy.  Brief history and report were called to Dr. Loleta Books hospital; prior to the patient being transported to the floor via wheelchair.  Per the cancer Center nurse.  Also, this provider called a brief history.  Report to the receiving floor nurse as well.  Note: Patient should be considered a full code; since patient has no advance directives in  his chart.

## 2016-02-21 NOTE — Progress Notes (Signed)
Nursing Note: Results of CT of chest and BNP called to [facility].No new orders at this time.Pt resting quietly in bed and denies SOB.Lungs sounds clear but  decreased in L UQ.Lung sounds otherwise are clear.Pt educated to make this nurse aware of SOB or any distress.wbb

## 2016-02-21 NOTE — Assessment & Plan Note (Signed)
Patient is status post both chemotherapy and radiation treatments.  He is currently undergoing observation only.  See further notes for details of today's visit.  Patient is scheduled for labs and a restaging CT on 03/05/2016.  He is scheduled for follow-up visit on 03/13/2016.

## 2016-02-21 NOTE — Progress Notes (Signed)
SYMPTOM MANAGEMENT CLINIC    Chief Complaint: Pneumonia  HPI:  Paul Sandoval 75 y.o. male diagnosed with lung cancer.  Patient is status post chemotherapy; and currently undergoing observation only.   Patient was admitted to the hospital on 02/05/2016 and discharge from hospital on 02/07/2016 with a diagnosis of pneumonia.  He received IV antibiotics in the hospital; and was discharged with 3 days of Levaquin 750 mg per day.  He continues to experience some chronic cough; and intermittent fever.  He was seen last week in the cancer Center symptom management clinic for the symptoms.  He denied any chest pain, chest pressure, or pain with inspiration.  Chest x-ray that time revealed stable but continued pneumonia.  Patient was prescribed an additional Levaquin 750 mg on a daily basis for a total of 5 more days.  Patient presented back to the [facility] today with complaint of increased shortness of breath, continued, congested cough, and increased fatigue.  He also states that he continues with intermittent fevers to a maximum of 100.1.  He states that he had to sleep sitting up at a 45 angle last night due to shortness of breath.  He continues to report no chest pain, chest pressure, or pain with inspiration.  Exam today reveals breath sounds essentially clear bilaterally; in no acute respiratory distress.  Patient does not appear short of breath; but does continue with an occasional dry cough.  Vital signs were essentially stable today; and patient was afebrile.  O2 sat was 97% room air.  Chest x-ray obtained today revealed progressive pneumonia and a mild right pleural effusion.  Reviewed all findings with Dr. Irene Limbo call physician; who agreed that patient should be admitted to the hospital due to his progressive pneumonia and failed outpatient therapy.  Brief history and report were called to Dr. Loleta Books hospital; prior to the patient being transported to the floor via wheelchair.   Per the cancer Center nurse.  Also, this provider called a brief history.  Report to the receiving floor nurse as well.  Note: Patient should be considered a full code; since patient has no advance directives in his chart.  Oncology History   Patient presented to ED with hemoptysis.  Work up showed right lung mass.  Primary cancer of right upper lobe of lung ([place])   Staging form: Lung, AJCC 7th Edition     Clinical stage from 12/01/2015: Stage IIIA (T2b, N2, M0) - Signed by Curt Bears, MD on 12/01/2015       Primary cancer of right upper lobe of lung ([place])   11/11/2015 Imaging CTA . 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 This mass abuts the anterior visceral pleura along a broad base. No pleural effusion. 3. Right hilar and subcarinal, right paratracheal and lef   11/17/2015 Pathology Results BRONCHIAL WASHING (SPECIMEN 2 OF 2 COLLECTED 11-17-2015) MALIGNANT CELLS PRESENT, CONSISTENT WITH NON-SMALL CELL CARCINOMA   11/17/2015 Surgery Bronchoscopy   11/24/2015 Initial Diagnosis Primary cancer of right upper lobe of lung ([place])   11/25/2015 Imaging PET IMPRESSION: 1. Hypermetabolic RIGHT upper lobe mass abutting the pleural surface consists with bronchogenic carcinoma. 2. Hypermetabolic RIGHT hilar and RIGHT lower paratracheal metastatic adenopathy.   12/02/2015 -  Radiation Therapy SIM   12/08/2015 Imaging MRI Brain No metastatic disease is observed   12/12/2015 -  Chemotherapy 1st chemotherapy Taxol/Carbo    Review of Systems  Constitutional: Positive for fever, chills, weight loss  and malaise/fatigue.  Respiratory: Positive for cough and shortness of breath. Negative for wheezing.   All other systems reviewed and are negative.   Past Medical History  Diagnosis Date  . 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 AAA (abdominal aortic aneurysm) ([place]) 2010    4.4 cm 08/2008;4.44 in 7/10 and 4.65 in 08/2009;  4.8 by CT in 11/2009; 4.3 by ultrasound in 08/2010  . Hypertension   . Hyperlipidemia   . Tobacco abuse, in remission     40 pack year total consumption; discontinued in 1996  . GERD (gastroesophageal reflux disease)   . Right bundle branch block   . Diverticulosis   . Colonic polyp 2002    polypectomy in 2002  . COPD (chronic obstructive pulmonary disease) ([place])   . CAD (coronary artery disease)     03/18/14:  PCI with DES to distal left main. 7/29: DES to the SVG to Diag  . ED (erectile dysfunction)   . IFG (impaired fasting glucose)   . Chronic rhinitis   . Myocardial infarction [facility])     "told h/o silent MI sometime before 1996"  . Pneumonia ~ 2001; ~ 2005  . Chronic bronchitis ([place])   . Arthritis     "fingers" (03/18/2014)  . Cardiomyopathy, ischemic     Echo 03/17/14: EF 45-50%  . Encounter for antineoplastic chemotherapy 12/19/2015  . Cancer [facility])     Upper right lobe lung cancer    Past Surgical History  Procedure Laterality Date  . Colonoscopy  2002    polypectomy-patient denies  . Laparoscopic cholecystectomy  12/2009  . Abdominal aortic endovascular stent graft N/A 12/11/2012    Procedure: ABDOMINAL AORTIC ENDOVASCULAR STENT GRAFT;  Surgeon: Mal Misty, MD;  Location: [place];  Service: Vascular;  Laterality: N/A;  Ultrasound guided; [place]  . Abdominal aortic aneurysm repair  11/2012  . Total hip arthroplasty Left 01/21/2013    Procedure: TOTAL HIP ARTHROPLASTY ANTERIOR APPROACH;  Surgeon: Mauri Pole, MD;  Location: [place];  Service: Orthopedics;  Laterality: Left;  . Joint replacement    . Coronary artery bypass graft  01/09/1995    "CABG X3"  . Cardiac catheterization  01/08/1995  . Coronary angioplasty with stent placement  03/18/2014    "1"  . Coronary angioplasty with stent placement  03/24/2014    "1"  . Left and right heart catheterization with coronary/graft angiogram N/A 03/18/2014    Procedure: LEFT AND RIGHT HEART CATHETERIZATION WITH Beatrix Fetters;  Surgeon: Blane Ohara, MD;  Location: [facility] CATH LAB;  Service: Cardiovascular;  Laterality: N/A;  . Percutaneous coronary stent intervention (pci-s)  03/18/2014    Procedure: PERCUTANEOUS CORONARY STENT INTERVENTION (PCI-S);  Surgeon: Blane Ohara, MD;  Location: [facility] CATH LAB;  Service: Cardiovascular;;  . Percutaneous coronary stent intervention (pci-s) N/A 03/24/2014    Procedure: PERCUTANEOUS CORONARY STENT INTERVENTION (PCI-S);  Surgeon: Blane Ohara, MD;  Location: [facility] CATH LAB;  Service: Cardiovascular;  Laterality: N/A;  . 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[place]); Right carotid bruit; Other and unspecified angina pectoris; Cardiomyopathy, ischemic:  EF 45-50% echo 03/17/14; Thrombocytopenia ([place]); Chronic obstructive airway disease with asthma ([place]);  CKD (chronic kidney disease) stage 3, GFR 30-59 ml/min; Hyponatremia; Primary cancer of right upper lobe of lung ([place]); AAA (abdominal aortic aneurysm) without rupture ([place]); Acute on chronic renal failure ([place]); HTN (hypertension); HCAP (healthcare-associated pneumonia); Anemia due to antineoplastic chemotherapy; Peripheral edema; Hypoalbuminemia due to protein-calorie malnutrition ([place]); Fever, unspecified; and Fever on his problem list.    is allergic to neomycin.    Medication List       This list is accurate as of: 02/21/16  1:23 PM.  Always use your most recent med list.               acetaminophen 500 MG tablet  Commonly known as:  TYLENOL  Take 500 mg by mouth every 6 (six) hours as needed for mild pain.     CENTRUM  SILVER ADULT 50+ Tabs  Take 1 tablet by mouth daily.     clopidogrel 75 MG tablet  Commonly known as:  PLAVIX  Take 1 tablet (75 mg total) by mouth daily. Reported on 12/02/2015     docusate sodium 100 MG capsule  Commonly known as:  COLACE  Take 100 mg by mouth daily as needed for mild constipation or moderate constipation.     fluticasone furoate-vilanterol 100-25 MCG/INH Aepb  Commonly known as:  BREO ELLIPTA  Inhale 1 puff into the lungs daily.     Iron 28 MG Tabs  Take 28 mg by mouth daily.     levofloxacin 750 MG tablet  Commonly known as:  LEVAQUIN  Take 1 tablet (750 mg total) by mouth daily.     metoprolol 50 MG tablet  Commonly known as:  LOPRESSOR  Take 50 mg by mouth 2 (two) times daily.     nitroGLYCERIN 0.4 MG SL tablet  Commonly known as:  NITROSTAT  Place 1 tablet (0.4 mg total) under the tongue every 5 (five) minutes as needed for chest pain.     omeprazole 20 MG capsule  Commonly known as:  PRILOSEC  TAKE ONE CAPSULE BY MOUTH DAILY.     prochlorperazine 10 MG tablet  Commonly known as:  COMPAZINE  TAKE (1) TABLET BY MOUTH EVERY SIX HOURS AS NEEDED FOR NAUSEA AND VOMITING.     simvastatin 80 MG tablet  Commonly known as:  ZOCOR  Take 0.5 tablets (40 mg total) by mouth at bedtime.     sodium chloride 1 g tablet  Take 2 tablets (2 g total) by mouth 2 (two) times daily with a meal.     sucralfate 1 g tablet  Commonly known as:  CARAFATE  Dissolve in 8 oz water and drink 5 minutes prior to meals and at bedtime     tiotropium 18 MCG inhalation capsule  Commonly known as:  SPIRIVA  Place 18 mcg into inhaler and inhale daily.     VENTOLIN HFA 108 (90 Base) MCG/ACT inhaler  Generic drug:  albuterol  INHALE 2 PUFFS BY MOUTH EVERY 4 TO 6 HOURS AS NEEDED FOR WHEEZING.         PHYSICAL EXAMINATION  Oncology Vitals 02/21/2016 02/21/2016  Height - -  Weight - 85.276 kg  Weight (lbs) - 188 lbs  BMI (kg/m2) - 26.98 kg/m2  Temp 98.2 98  Pulse 117 113    Resp 20 20  SpO2 99 95  BSA (m2) - 2.05 m2   BP Readings from Last 2 Encounters:  02/21/16 140/96  02/21/16 132/63    Physical Exam  Constitutional: He is oriented to person, place,  and time. Vital signs are normal. He appears malnourished. He appears unhealthy. He appears cachectic.  HENT:  Head: Normocephalic and atraumatic.  Mouth/Throat: Oropharynx is clear and moist.  Eyes: Conjunctivae and EOM are normal. Pupils are equal, round, and reactive to light. Right eye exhibits no discharge. Left eye exhibits no discharge. No scleral icterus.  Neck: Normal range of motion. Neck supple. No JVD present. No tracheal deviation present. No thyromegaly present.  Cardiovascular: Normal rate, regular rhythm, normal heart sounds and intact distal pulses.   Pulmonary/Chest: Effort normal and breath sounds normal. No respiratory distress. He has no wheezes. He has no rales. He exhibits no tenderness.  Occasional dry cough  Abdominal: Soft. Bowel sounds are normal. He exhibits no distension and no mass. There is no tenderness. There is no rebound and no guarding.  Musculoskeletal: Normal range of motion. He exhibits no edema or tenderness.  Lymphadenopathy:    He has no cervical adenopathy.  Neurological: He is alert and oriented to person, place, and time. Gait normal.  Skin: Skin is warm and dry. No rash noted. No erythema. No pallor.  Psychiatric: Affect normal.  Nursing note and vitals reviewed.   LABORATORY DATA:. Admission on 02/21/2016  Component Date Value Ref Range Status  . Procalcitonin 02/21/2016 <0.10   Final   Comment:        Interpretation: PCT (Procalcitonin) <= 0.5 ng/mL: Systemic infection (sepsis) is not likely. Local bacterial infection is possible. (NOTE)         ICU PCT Algorithm               Non ICU PCT Algorithm    ----------------------------     ------------------------------         PCT < 0.25 ng/mL                 PCT < 0.1 ng/mL     Stopping of antibiotics             Stopping of antibiotics       strongly encouraged.               strongly encouraged.    ----------------------------     ------------------------------       PCT level decrease by               PCT < 0.25 ng/mL       >= 80% from peak PCT       OR PCT 0.25 - 0.5 ng/mL          Stopping of antibiotics                                             encouraged.     Stopping of antibiotics           encouraged.    ----------------------------     ------------------------------       PCT level decrease by              PCT >= 0.25 ng/mL       < 80% from peak PCT        AND PCT >= 0.5 ng/mL            Continuin                          g antibiotics  encouraged.       Continuing antibiotics            encouraged.    ----------------------------     ------------------------------     PCT level increase compared          PCT > 0.5 ng/mL         with peak PCT AND          PCT >= 0.5 ng/mL             Escalation of antibiotics                                          strongly encouraged.      Escalation of antibiotics        strongly encouraged.   . B Natriuretic Peptide 02/21/2016 942.6* 0.0 - 100.0 pg/mL Final  Appointment on 02/21/2016  Component Date Value Ref Range Status  . WBC 02/21/2016 7.1  4.0 - 10.3 10e3/uL Final  . NEUT# 02/21/2016 5.5  1.5 - 6.5 10e3/uL Final  . HGB 02/21/2016 8.8* 13.0 - 17.1 g/dL Final  . HCT 02/21/2016 27.6* 38.4 - 49.9 % Final  . Platelets 02/21/2016 126* 140 - 400 10e3/uL Final  . MCV 02/21/2016 89.6  79.3 - 98.0 fL Final  . MCH 02/21/2016 28.6  27.2 - 33.4 pg Final  . MCHC 02/21/2016 31.9* 32.0 - 36.0 g/dL Final  . RBC 02/21/2016 3.08* 4.20 - 5.82 10e6/uL Final  . RDW 02/21/2016 18.4* 11.0 - 14.6 % Final  . lymph# 02/21/2016 0.7* 0.9 - 3.3 10e3/uL Final  . MONO# 02/21/2016 0.7  0.1 - 0.9 10e3/uL Final  . Eosinophils Absolute 02/21/2016 0.2  0.0 - 0.5 10e3/uL Final  . Basophils Absolute 02/21/2016 0.0   0.0 - 0.1 10e3/uL Final  . NEUT% 02/21/2016 77.0* 39.0 - 75.0 % Final  . LYMPH% 02/21/2016 9.7* 14.0 - 49.0 % Final  . MONO% 02/21/2016 10.5  0.0 - 14.0 % Final  . EOS% 02/21/2016 2.5  0.0 - 7.0 % Final  . BASO% 02/21/2016 0.3  0.0 - 2.0 % Final  . Sodium 02/21/2016 134* 136 - 145 mEq/L Final  . Potassium 02/21/2016 4.6  3.5 - 5.1 mEq/L Final  . Chloride 02/21/2016 97* 98 - 109 mEq/L Final  . CO2 02/21/2016 28  22 - 29 mEq/L Final  . Glucose 02/21/2016 83  70 - 140 mg/dl Final   Glucose reference range is for nonfasting patients. Fasting glucose reference range is 70- 100.  Marland Kitchen BUN 02/21/2016 10.2  7.0 - 26.0 mg/dL Final  . Creatinine 02/21/2016 1.1  0.7 - 1.3 mg/dL Final  . Total Bilirubin 02/21/2016 0.45  0.20 - 1.20 mg/dL Final  . Alkaline Phosphatase 02/21/2016 93  40 - 150 U/L Final  . AST 02/21/2016 22  5 - 34 U/L Final  . ALT 02/21/2016 17  0 - 55 U/L Final  . Total Protein 02/21/2016 6.7  6.4 - 8.3 g/dL Final  . Albumin 02/21/2016 2.3* 3.5 - 5.0 g/dL Final  . Calcium 02/21/2016 9.1  8.4 - 10.4 mg/dL Final  . Anion Gap 02/21/2016 8  3 - 11 mEq/L Final  . EGFR 02/21/2016 63* >90 ml/min/1.73 m2 Final   eGFR is calculated using the CKD-EPI Creatinine Equation (2009)    RADIOGRAPHIC STUDIES: Dg Chest 2 View  02/21/2016  CLINICAL DATA:  Stage IV lung malignancy status  post chemotherapy and radiation; recent episode of pneumonia; increase shortness of breath and orthopnea. EXAM: CHEST  2 VIEW COMPARISON:  PA and lateral chest x-ray of February 16, 2016 FINDINGS: The right upper lobe density has become more confluent. The right lower lung is adequately inflated. There is a small right pleural effusion which is unchanged. The left lung is clear. There are post CABG changes. There is no cardiomegaly or pulmonary edema. The bony thorax exhibits no acute abnormality. IMPRESSION: Worsening of alveolar opacity in the right upper lobe which may reflect pneumonia either typical or postobstructive.  Lymphangitic spread of malignancy could be present as well but is fell less likely than progressive pneumonia. There is no CHF. There is a small right pleural effusion. Electronically Signed   By: David  Martinique M.D.   On: 02/21/2016 11:02   Ct Chest W Contrast  02/21/2016  CLINICAL DATA:  75 year old with current history of squamous cell carcinoma of the right upper lobe, treated with radiation therapy and chemotherapy, completed earlier this month. Patient was admitted 02/05/2016 with community acquired pneumonia. He presents again today with cough and fever. EXAM: CT CHEST WITH CONTRAST TECHNIQUE: Multidetector CT imaging of the chest was performed during intravenous contrast administration. CONTRAST:  54m ISOVUE-300 IOPAMIDOL (ISOVUE-300) INJECTION 61% IV. COMPARISON:  CT CT 11/25/2015.  CTA chest 11/11/2015, 03/10/2012. FINDINGS: Cardiovascular: Normal heart size. Extensive 3 vessel coronary atherosclerosis. Prior sternotomy for CABG. Visualized portions of the coronary grafts patent. No pericardial effusion moderate to severe atherosclerosis involving the thoracic and upper abdominal aorta without aneurysm. Atherosclerosis involving the proximal great vessels without visible stenosis. Mediastinum/Lymph Nodes: Interval marked decrease in size of the pathologic low right paratracheal mediastinal lymph node (station 4R) since the PET-CT, current measurement approximating 1.1 x 1.3 cm (previously 2.0 x 1.8 cm). The hypermetabolic right axillary lymph node on the PET-CT is unchanged to perhaps slightly decreased in size. No new or enlarging lymphadenopathy. Small hiatal hernia. Thyroid gland unremarkable. Lungs/Pleura: Since the PET-CT, marked reduction in size of the right upper lobe lung mass, now measuring approximately 2.2 x 1.9 cm (series 5, image 37), previously 4.3 x 3.4 cm. Interval development of confluent airspace opacities with air bronchograms in the right upper lobe and in the superior segment right  lower lobe, associated with a large right pleural effusion. Patchy airspace opacities are present medially in the left upper lobe. Small left pleural effusion. Upper abdomen: Surgically absent gallbladder. The he visualized proximal portion of the abdominal aortic stent graft is unremarkable. No acute findings. 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 No evidence of osseous metastatic disease. IMPRESSION: 1. Confluent airspace pneumonia involving the right upper lobe and the superior segment right lower lobe, with patchy pneumonia involving the left upper lobe. The right upper lobe pneumonia is superimposed upon post radiation changes. 2. Large right pleural effusion and small left pleural effusion. 3. Interval marked reduction in size of the right upper lobe lung cancer since the prior PET-CT 11/25/2015. Measurements are given above. 4. Interval reduction in size of the pathologic station 4R mediastinal lymph node since the prior PET-CT. No new or enlarging lymphadenopathy. Electronically Signed   By: TEvangeline DakinM.D.   On: 02/21/2016 19:10    ASSESSMENT/PLAN:    Primary cancer of right upper lobe of lung ([facility] Patient is status post both chemotherapy and radiation treatments.  He is currently undergoing observation only.  See further notes for details of today's visit.  Patient is scheduled  for labs and a restaging CT on 03/05/2016.  He is scheduled for follow-up visit on 03/13/2016.    Hyponatremia Patient was admitted to the hospital on 02/05/2016 for pneumonia and severe hyponatremia.  He was prescribed salt tablets at that time; which he has now completed.  Patient's sodium has improved to 134 today.  Patient states that he is drinking and eating fairly well.  Patient was encouraged to continue to push fluids and to increase his salt intake if at all possible.  We'll continue to monitor closely.  Hypoalbuminemia due to protein-calorie  malnutrition ([facility]) Albumin continues to decrease and is currently 2.3.  Patient was encouraged to push protein in his diet as much as possible.  HCAP (healthcare-associated pneumonia) Patient was admitted to the hospital on 02/05/2016 and discharge from hospital on 02/07/2016 with a diagnosis of pneumonia.  He received IV antibiotics in the hospital; and was discharged with 3 days of Levaquin 750 mg per day.  He continues to experience some chronic cough; and intermittent fever.  He was seen last week in the cancer Center symptom management clinic for the symptoms.  He denied any chest pain, chest pressure, or pain with inspiration.  Chest x-ray that time revealed stable but continued pneumonia.  Patient was prescribed an additional Levaquin 750 mg on a daily basis for a total of 5 more days.  Patient presented back to the [place] today with complaint of increased shortness of breath, continued, congested cough, and increased fatigue.  He also states that he continues with intermittent fevers to a maximum of 100.1.  He states that he had to sleep sitting up at a 45 angle last night due to shortness of breath.  He continues to report no chest pain, chest pressure, or pain with inspiration.  Exam today reveals breath sounds essentially clear bilaterally; in no acute respiratory distress.  Patient does not appear short of breath; but does continue with an occasional dry cough.  Vital signs were essentially stable today; and patient was afebrile.  O2 sat was 97% room air.  Chest x-ray obtained today revealed progressive pneumonia and a mild right pleural effusion.  Reviewed all findings with Dr. Irene Limbo call physician; who agreed that patient should be admitted to the hospital due to his progressive pneumonia and failed outpatient therapy.  Brief history and report were called to Dr. Loleta Books hospital; prior to the patient being transported to the floor via wheelchair.  Per the cancer Center  nurse.  Also, this provider called a brief history.  Report to the receiving floor nurse as well.  Note: Patient should be considered a full code; since patient has no advance directives in his chart.     Anemia due to antineoplastic chemotherapy Patient continues with chronic mild anemia with hemoglobin slightly improved from 8.6 up to 8.8 today.  Will continue to monitor closely.   Patient stated understanding of all instructions; and was in agreement with this plan of care. The patient knows to call the clinic with any problems, questions or concerns.   Total time spent with patient was 40 minutes;  with greater than 75 percent of that time spent in face to face counseling regarding patient's symptoms,  and coordination of care and follow up.  Disclaimer:This dictation was prepared with Dragon/digital dictation along with [employer]. Any transcriptional errors that result from this process are unintentional.  Drue Second, NP 02/21/2016

## 2016-02-21 NOTE — Assessment & Plan Note (Addendum)
Patient was admitted to the hospital on 02/05/2016 for pneumonia and severe hyponatremia.  He was prescribed salt tablets at that time; which he has now completed.  Patient's sodium has improved to 134 today.  Patient states that he is drinking and eating fairly well.  Patient was encouraged to continue to push fluids and to increase his salt intake if at all possible.  We'll continue to monitor closely.

## 2016-02-21 NOTE — Assessment & Plan Note (Addendum)
Albumin continues to decrease and is currently 2.3.  Patient was encouraged to push protein in his diet as much as possible.

## 2016-02-21 NOTE — Assessment & Plan Note (Signed)
Patient continues with chronic mild anemia with hemoglobin slightly improved from 8.6 up to 8.8 today.  Will continue to monitor closely.

## 2016-02-21 NOTE — Telephone Encounter (Signed)
Ref: Paul Sandoval DOB 11/01/1940  75 yo M hx of COPD now with Lung CA, s/p chemorad  Admitted Jun 11-13 with CAP, got IV abx (hosp complicated by HypoNa resolved), sent home with 3d Levaquin which was completed.  Came to [facility] with cont fevers after discharge, got 3 more days Levaquin.  Now back again to [facility] with cont intermittent fevers, 101F in last 24 hrs, increased SOB, continued cough.  Another CXR today with RUL opacity.   Failed outpt therapy.   Can't lay down comfotably at night, also with pleural effusion.   132/63  103  18  98.1  97RA Na 134  Cr 1.1 WBC 7.1  Hgb 8.8 stable  Plt 126  Plan for IV abx, just go ahead and do repeat staging CT.   Full code

## 2016-02-21 NOTE — H&P (Signed)
History and Physical  Patient [name]     ELF:[id number]    DOB: [birth year]    DOA: 02/21/2016 PCP: [name], MD   Patient coming from: Home ---[place] [facility] --> dir admit  Chief Complaint: Fever and dyspnea at night  HPI: Paul Sandoval is a 75 y.o. male with a past medical history significant for NSCLC Stage IIIA (T2b, N2, M0), COPD, peripheral vascular disease, and CAD s/p CABG and decreased LV function EF 45% in 2015 who presents with fever and cough.  The patient was diagnosed with lung cancer in March and completed radiation + carboplatin/paclitaxel therapy in early June.  He was admitted here 6/11/-6/14 with fever and cough, diagnosed with CAP.  That hospitalization was complicated by hyponatremia which was treated with salt tabs.  After discharge he came to the [facility] on 6/22 with persistent low-grade fevers (100.67F) and non-productive cough and so his antibiotics were continued 5 days.   Since then, he has had continued cough and intermittent low grade fevers.  He has some mild ankle swelling and orthopnea at night that resolves with sitting up in a recliner.  He has been walking laps in his house 4x per day, and not felt SOB with exertion.  He is not producing sputum, has no chest pain, pleuritic pain.  His albuterol is not helping.    Today he had his follow up at [facility] and was complaining of above.  Was afebrile but tachycardic.  CXR was repeated that showed worsening RUL opacity, and so [facility] was asked to accept as direct admit from [facility] for pneumonia failed outpatient therapy.  Plan was discussed with Dr. Irene Limbo on-call to go ahead and order staging CT planned for next week.       ROS: Pt complains of fever, orthopnea, ankle swelling, dry cough, PND.  All other systems negative except as just noted or noted in the history of present illness.    Past Medical History  Diagnosis Date  . Arteriosclerotic cardiovascular  disease (ASCVD) 1996    CABG-1996  . AAA (abdominal aortic aneurysm) ([place]) 2010    4.4 cm 08/2008;4.44 in 7/10 and 4.65 in 08/2009; 4.8 by CT in 11/2009; 4.3 by ultrasound in 08/2010  . Hypertension   . Hyperlipidemia   . Tobacco abuse, in remission     40 pack year total consumption; discontinued in 1996  . GERD (gastroesophageal reflux disease)   . Right bundle branch block   . Diverticulosis   . Colonic polyp 2002    polypectomy in 2002  . COPD (chronic obstructive pulmonary disease) ([place])   . CAD (coronary artery disease)     03/18/14:  PCI with DES to distal left main. 7/29: DES to the SVG to Diag  . ED (erectile dysfunction)   . IFG (impaired fasting glucose)   . Chronic rhinitis   . Myocardial infarction [facility])     "told h/o silent MI sometime before 1996"  . Pneumonia ~ 2001; ~ 2005  . Chronic bronchitis ([place])   . Arthritis     "fingers" (03/18/2014)  . Cardiomyopathy, ischemic     Echo 03/17/14: EF 45-50%  . Encounter for antineoplastic chemotherapy 12/19/2015  . Cancer [facility])     Upper right lobe lung cancer    Past Surgical History  Procedure Laterality Date  . Colonoscopy  2002    polypectomy-patient denies  . Laparoscopic cholecystectomy  12/2009  . Abdominal aortic endovascular stent  graft N/A 12/11/2012    Procedure: ABDOMINAL AORTIC ENDOVASCULAR STENT GRAFT;  Surgeon: Mal Misty, MD;  Location: [place];  Service: Vascular;  Laterality: N/A;  Ultrasound guided; [place]  . Abdominal aortic aneurysm repair  11/2012  . Total hip arthroplasty Left 01/21/2013    Procedure: TOTAL HIP ARTHROPLASTY ANTERIOR APPROACH;  Surgeon: Mauri Pole, MD;  Location: [place];  Service: Orthopedics;  Laterality: Left;  . Joint replacement    . Coronary artery bypass graft  01/09/1995    "CABG X3"  . Cardiac catheterization  01/08/1995  . Coronary angioplasty with stent placement  03/18/2014    "1"  . Coronary angioplasty with stent placement  03/24/2014    "1"  . Left and right heart  catheterization with coronary/graft angiogram N/A 03/18/2014    Procedure: LEFT AND RIGHT HEART CATHETERIZATION WITH Beatrix Fetters;  Surgeon: Blane Ohara, MD;  Location: [facility] CATH LAB;  Service: Cardiovascular;  Laterality: N/A;  . Percutaneous coronary stent intervention (pci-s)  03/18/2014    Procedure: PERCUTANEOUS CORONARY STENT INTERVENTION (PCI-S);  Surgeon: Blane Ohara, MD;  Location: [facility] CATH LAB;  Service: Cardiovascular;;  . Percutaneous coronary stent intervention (pci-s) N/A 03/24/2014    Procedure: PERCUTANEOUS CORONARY STENT INTERVENTION (PCI-S);  Surgeon: Blane Ohara, MD;  Location: [facility] CATH LAB;  Service: Cardiovascular;  Laterality: N/A;  . Video bronchoscopy N/A 11/17/2015    Procedure: VIDEO BRONCHOSCOPY WITH FLUORO;  Surgeon: Rigoberto Noel, MD;  Location: [place];  Service: Cardiopulmonary;  Laterality: N/A;    Social History: Patient lives in [place].  The patient walks unassisted.  Remote former smoker.  No dementia.  Allergies  Allergen Reactions  . Neomycin Hives    Family history: family history includes Arthritis in his mother and sister; Cancer in his father; Heart disease in his father; Hypertension in his brother; Parkinsonism in his mother. There is no history of Colon cancer or Colon polyps.  Prior to Admission medications   Medication Sig Start Date End Date Taking? Authorizing Provider  acetaminophen (TYLENOL) 500 MG tablet Take 500 mg by mouth every 6 (six) hours as needed for mild pain.   Yes Historical Provider, MD  aspirin EC 81 MG tablet Take 81 mg by mouth daily.   Yes Historical Provider, MD  clopidogrel (PLAVIX) 75 MG tablet Take 1 tablet (75 mg total) by mouth daily. Reported on 12/02/2015 01/26/16  Yes Josue Hector, MD  docusate sodium (COLACE) 100 MG capsule Take 100 mg by mouth daily as needed for mild constipation or moderate constipation.   Yes Historical Provider, MD  Ferrous Sulfate (IRON) 28 MG TABS Take 28 mg by mouth  daily.   Yes Historical Provider, MD  fluticasone furoate-vilanterol (BREO ELLIPTA) 100-25 MCG/INH AEPB Inhale 1 puff into the lungs daily. 02/09/16  Yes Rigoberto Noel, MD  levofloxacin (LEVAQUIN) 750 MG tablet Take 1 tablet (750 mg total) by mouth daily. 02/16/16  Yes Susanne Borders, NP  metoprolol (LOPRESSOR) 50 MG tablet Take 50 mg by mouth 2 (two) times daily.    Yes Historical Provider, MD  Multiple Vitamins-Minerals (CENTRUM SILVER ADULT 50+) TABS Take 1 tablet by mouth daily.    Yes Historical Provider, MD  nitroGLYCERIN (NITROSTAT) 0.4 MG SL tablet Place 1 tablet (0.4 mg total) under the tongue every 5 (five) minutes as needed for chest pain. 03/23/15  Yes Josue Hector, MD  omeprazole (PRILOSEC) 20 MG capsule TAKE ONE CAPSULE BY MOUTH DAILY. Patient taking differently: TAKE  20 MG BY MOUTH DAILY. 01/09/16  Yes [name], MD  prochlorperazine (COMPAZINE) 10 MG tablet TAKE (1) TABLET BY MOUTH EVERY SIX HOURS AS NEEDED FOR NAUSEA AND VOMITING. 01/26/16  Yes Curt Bears, MD  simvastatin (ZOCOR) 80 MG tablet Take 0.5 tablets (40 mg total) by mouth at bedtime. 08/23/15  Yes [name], MD  VENTOLIN HFA 108 (90 Base) MCG/ACT inhaler INHALE 2 PUFFS BY MOUTH EVERY 4 TO 6 HOURS AS NEEDED FOR WHEEZING. 09/02/15  Yes [name], MD  sodium chloride 1 g tablet Take 2 tablets (2 g total) by mouth 2 (two) times daily with a meal. Patient not taking: Reported on 02/21/2016 02/08/16   Debbe Odea, MD  sucralfate (CARAFATE) 1 g tablet Dissolve in 8 oz water and drink 5 minutes prior to meals and at bedtime Patient not taking: Reported on 02/21/2016 01/05/16   Hayden Pedro, PA-C       Physical Exam: BP 148/86 mmHg  Pulse 113  Temp(Src) 98 F (36.7 C) (Oral)  Resp 20  Wt 85.276 kg (188 lb)  SpO2 95% General appearance: Well-developed, elderly  male, alert and in no acute distress.   Eyes: Conjunctiva normal, lids and lashes normal.   PERRL.  ENT: No nasal deformity,  discharge.  OP moist without lesions.   Lymph: No cervical or supraclavicular lymphadenopathy. Skin: Warm and dry.  No suspicious rashes or lesions. Cardiac: RRR, nl S1-S2, no murmurs appreciated, ?S4.  Capillary refill is brisk.  JVP normal.  1+ ankle LE edema bilaterally.  Radial and DP pulses 2+ and symmetric. Respiratory: Normal respiratory rate and rhythm.  CTAB without rales or wheezes. GI: Abdomen soft without rigidity.  No TTP. No ascites, distension, hepatosplenomegaly.   MSK: No deformities or effusions.  No clubbing/cyanosis. Neuro: Cranial nerves normal.  Sensation normal.  Sensorium intact and responding to questions, attention normal.  Speech is fluent.  Moves all extremities equally and with normal coordination.    Psych: Affect normal.  Judgment and insight appear normal.       Labs on Admission:  I have personally reviewed following labs and imaging studies: CBC:  Recent Labs Lab 02/15/16 0843 02/16/16 1400 02/21/16 1116  WBC 6.3 6.8 7.1  NEUTROABS 4.6 5.7 5.5  HGB  --  8.6* 8.8*  HCT 26.9* 26.7* 27.6*  MCV 88 89.0 89.6  PLT 138* 129* 562*   Basic Metabolic Panel:  Recent Labs Lab 02/15/16 0843 02/16/16 1400 02/21/16 1116  NA 138 133* 134*  K 4.1 3.8 4.6  CL 96  --   --   CO2 '25 27 28  '$ GLUCOSE 103* 115 83  BUN 13 11.8 10.2  CREATININE 1.23 1.2 1.1  CALCIUM 8.5* 8.6 9.1   GFR: Estimated Creatinine Clearance: 59 mL/min (by C-G formula based on Cr of 1.1).  Liver Function Tests:  Recent Labs Lab 02/16/16 1400 02/21/16 1116  AST 24 22  ALT 26 17  ALKPHOS 98 93  BILITOT 0.53 0.45  PROT 6.4 6.7  ALBUMIN 2.4* 2.3*         Radiological Exams on Admission: Personally reviewed: Dg Chest 2 View  02/21/2016  CLINICAL DATA:  Stage IV lung malignancy status post chemotherapy and radiation; recent episode of pneumonia; increase shortness of breath and orthopnea. EXAM: CHEST  2 VIEW COMPARISON:  PA and lateral chest x-ray of February 16, 2016 FINDINGS:  The right upper lobe density has become more confluent. The right lower lung is adequately inflated. There  is a small right pleural effusion which is unchanged. The left lung is clear. There are post CABG changes. There is no cardiomegaly or pulmonary edema. The bony thorax exhibits no acute abnormality. IMPRESSION: Worsening of alveolar opacity in the right upper lobe which may reflect pneumonia either typical or postobstructive. Lymphangitic spread of malignancy could be present as well but is fell less likely than progressive pneumonia. There is no CHF. There is a small right pleural effusion. Electronically Signed   By: David  Martinique M.D.   On: 02/21/2016 11:02        Assessment/Plan Principal Problem:   Fever, unspecified Active Problems:   Chronic obstructive airway disease with asthma (HCC)   CKD (chronic kidney disease) stage 3, GFR 30-59 ml/min   Anemia due to antineoplastic chemotherapy  1. Fever and cough and dyspnea:  Patient appears clinically well.  PE is doubted given no hypoxia, chronic tachycardia, and suspect dyspnea is from either post-obstructive PNA or CHF.    -Observe fever overnight off antibiotics -CT chest with contrast  -If temp >38C, obtain blood cultures, and start Cefepime and IV fluids -Repeat CBC tomorrow -If not fever and CT chest shows post-obstructive PNA, will discuss with Onc and Rad Onc   2. COPD:  -Continue home inhlalers  3. CKD:  Stable  4. Anemia and thrombocytopenia from chemo:  Stable. -Continue iron  5. CAD with HTN:  No personal history of CHF, but hx of CABG and EF 45% in 2015. -Check BNP (could dyspnea be related to fluid overload after salt tabs?) -Echo if BNP markedly elevated -Continue Plavix and aspirin, BB and statin      DVT prophylaxis: Lovenox  Code Status: FULL  Family Communication: None present  Disposition Plan: Anticipate CT scan tonight and further disposition pending findings and fever curve. Consults  called: Oncology Admission status: OBS, med surg At the point of initial evaluation, it is my clinical opinion that admission for OBSERVATION is reasonable and necessary because the patient's presenting complaints in the context of their chronic conditions represent sufficient risk of deterioration or significant morbidity to constitute reasonable grounds for close observation in the hospital setting, but that the patient may be medically stable for discharge from the hospital within 24 to 48 hours.    Medical decision making: Patient seen at 3:51 PM on 02/21/2016.  The patient was discussed with Dr. Irene Limbo and Retta Mac. What exists of the patient's chart was reviewed in depth.  Clinical condition: stable.        Edwin Dada [facility] Pager [phone]

## 2016-02-21 NOTE — Telephone Encounter (Signed)
"  I need to be seen but was told Dr. Julien Nordmann is not in today.  I have Rt. Lung cancer.  Took last antibiotic this morning for pneumonia in same rt lung.  It has become more difficult to breath.  I can't lie down.  I sat up in recliner last night at a 45 degree angle which only helped a few minutes.  I cough with any movement or to turn over.  Dry non-productive cough.  Temp yesterday = 100.1 which two tylenol brought it down, not checked yet today.  I live in [place] and can be there in 40 minutes.  Return number [phone].

## 2016-02-21 NOTE — Progress Notes (Addendum)
Pharmacy Antibiotic Note  Paul Sandoval is a 75 y.o. male with PMH of COPD and lung cancer s/p chemotherapy and radiation who failed Levofloxacin outpatient treatment for pneumonia, now admitted on 02/21/2016 with continued fevers, increased SOB, and continued cough. CXR today shows worsening RUL opacity. Pharmacy has been consulted for Cefepime dosing for HCAP.  Plan: Cefepime 2g IV q8h. Monitor renal function, cultures, clinical course.  Weight: 188 lb (85.276 kg)  Temp (24hrs), Avg:98.1 F (36.7 C), Min:98 F (36.7 C), Max:98.1 F (36.7 C)   Recent Labs Lab 02/15/16 0843 02/16/16 1400 02/21/16 1116  WBC 6.3 6.8 7.1  CREATININE 1.23 1.2 1.1    Estimated Creatinine Clearance: 59 mL/min (by C-G formula based on Cr of 1.1).    Allergies  Allergen Reactions  . Neomycin Hives    Antimicrobials this admission: 6/27 >> Cefepime >>  Dose adjustments this admission: --  Microbiology results: Sputum: ordered  Thank you for allowing pharmacy to be a part of this patient's care.   Lindell Spar, PharmD, BCPS Pager: [phone] 02/21/2016 4:31 PM    ADDENDUM:  Cefepime discontinued by MD. Per H&P, observe fever overnight off antibiotics. If temp > 38C, plan per MD is to obtain blood cultures and start Cefepime.   Lindell Spar, PharmD, BCPS Pager: [phone] 02/21/2016 4:52 PM

## 2016-02-22 ENCOUNTER — Observation Stay (HOSPITAL_BASED_OUTPATIENT_CLINIC_OR_DEPARTMENT_OTHER): Payer: Medicare Other

## 2016-02-22 ENCOUNTER — Observation Stay (HOSPITAL_COMMUNITY): Payer: Medicare Other

## 2016-02-22 ENCOUNTER — Encounter: Payer: Self-pay | Admitting: *Deleted

## 2016-02-22 DIAGNOSIS — K219 Gastro-esophageal reflux disease without esophagitis: Secondary | ICD-10-CM | POA: Diagnosis not present

## 2016-02-22 DIAGNOSIS — N183 Chronic kidney disease, stage 3 (moderate): Secondary | ICD-10-CM | POA: Diagnosis not present

## 2016-02-22 DIAGNOSIS — R509 Fever, unspecified: Secondary | ICD-10-CM | POA: Diagnosis not present

## 2016-02-22 DIAGNOSIS — I2510 Atherosclerotic heart disease of native coronary artery without angina pectoris: Secondary | ICD-10-CM | POA: Diagnosis not present

## 2016-02-22 DIAGNOSIS — J449 Chronic obstructive pulmonary disease, unspecified: Secondary | ICD-10-CM | POA: Diagnosis not present

## 2016-02-22 DIAGNOSIS — I252 Old myocardial infarction: Secondary | ICD-10-CM | POA: Diagnosis not present

## 2016-02-22 DIAGNOSIS — D6481 Anemia due to antineoplastic chemotherapy: Secondary | ICD-10-CM | POA: Diagnosis not present

## 2016-02-22 DIAGNOSIS — R0601 Orthopnea: Secondary | ICD-10-CM | POA: Diagnosis not present

## 2016-02-22 DIAGNOSIS — Z8261 Family history of arthritis: Secondary | ICD-10-CM | POA: Diagnosis not present

## 2016-02-22 DIAGNOSIS — I714 Abdominal aortic aneurysm, without rupture: Secondary | ICD-10-CM | POA: Diagnosis not present

## 2016-02-22 DIAGNOSIS — I4510 Unspecified right bundle-branch block: Secondary | ICD-10-CM | POA: Diagnosis not present

## 2016-02-22 DIAGNOSIS — R846 Abnormal cytological findings in specimens from respiratory organs and thorax: Secondary | ICD-10-CM | POA: Diagnosis not present

## 2016-02-22 DIAGNOSIS — R000 Tachycardia, unspecified: Secondary | ICD-10-CM | POA: Diagnosis not present

## 2016-02-22 DIAGNOSIS — R0602 Shortness of breath: Secondary | ICD-10-CM | POA: Diagnosis not present

## 2016-02-22 DIAGNOSIS — K5730 Diverticulosis of large intestine without perforation or abscess without bleeding: Secondary | ICD-10-CM | POA: Diagnosis not present

## 2016-02-22 DIAGNOSIS — J90 Pleural effusion, not elsewhere classified: Secondary | ICD-10-CM | POA: Diagnosis not present

## 2016-02-22 DIAGNOSIS — E785 Hyperlipidemia, unspecified: Secondary | ICD-10-CM | POA: Diagnosis not present

## 2016-02-22 DIAGNOSIS — I739 Peripheral vascular disease, unspecified: Secondary | ICD-10-CM | POA: Diagnosis not present

## 2016-02-22 DIAGNOSIS — I2583 Coronary atherosclerosis due to lipid rich plaque: Secondary | ICD-10-CM | POA: Diagnosis not present

## 2016-02-22 DIAGNOSIS — K449 Diaphragmatic hernia without obstruction or gangrene: Secondary | ICD-10-CM | POA: Diagnosis not present

## 2016-02-22 DIAGNOSIS — I255 Ischemic cardiomyopathy: Secondary | ICD-10-CM | POA: Diagnosis not present

## 2016-02-22 DIAGNOSIS — I5043 Acute on chronic combined systolic (congestive) and diastolic (congestive) heart failure: Secondary | ICD-10-CM | POA: Diagnosis not present

## 2016-02-22 DIAGNOSIS — J189 Pneumonia, unspecified organism: Secondary | ICD-10-CM | POA: Diagnosis not present

## 2016-02-22 DIAGNOSIS — D6959 Other secondary thrombocytopenia: Secondary | ICD-10-CM | POA: Diagnosis not present

## 2016-02-22 DIAGNOSIS — Z923 Personal history of irradiation: Secondary | ICD-10-CM | POA: Diagnosis not present

## 2016-02-22 DIAGNOSIS — I509 Heart failure, unspecified: Secondary | ICD-10-CM

## 2016-02-22 DIAGNOSIS — C3411 Malignant neoplasm of upper lobe, right bronchus or lung: Secondary | ICD-10-CM | POA: Diagnosis not present

## 2016-02-22 DIAGNOSIS — I130 Hypertensive heart and chronic kidney disease with heart failure and stage 1 through stage 4 chronic kidney disease, or unspecified chronic kidney disease: Secondary | ICD-10-CM | POA: Diagnosis not present

## 2016-02-22 LAB — ECHOCARDIOGRAM COMPLETE
EERAT: 10.44
EWDT: 120 ms
FS: 14 % — AB (ref 28–44)
Height: 70 in
IVS/LV PW RATIO, ED: 1.11
LA ID, A-P, ES: 42 mm
LA diam end sys: 42 mm
LA diam index: 2.07 cm/m2
LA vol index: 38.9 mL/m2
LAVOL: 79 mL
LAVOLA4C: 86.5 mL
LV PW d: 10.4 mm — AB (ref 0.6–1.1)
LV TDI E'LATERAL: 11.3
LV TDI E'MEDIAL: 10.5
LV dias vol index: 58 mL/m2
LV e' LATERAL: 11.3 cm/s
LV sys vol: 73 mL — AB (ref 21–61)
LVDIAVOL: 117 mL (ref 62–150)
LVEEAVG: 10.44
LVEEMED: 10.44
LVOT SV: 51 mL
LVOT VTI: 16.1 cm
LVOT area: 3.14 cm2
LVOT diameter: 20 mm
LVOT peak vel: 95.1 cm/s
LVSYSVOLIN: 36 mL/m2
MV Dec: 120
MV Peak grad: 6 mmHg
MVPKEVEL: 118 m/s
Simpson's disk: 38
Stroke v: 44 ml
Weight: 3008 oz

## 2016-02-22 LAB — CBC
HEMATOCRIT: 24.9 % — AB (ref 39.0–52.0)
HEMOGLOBIN: 8.1 g/dL — AB (ref 13.0–17.0)
MCH: 28.2 pg (ref 26.0–34.0)
MCHC: 32.5 g/dL (ref 30.0–36.0)
MCV: 86.8 fL (ref 78.0–100.0)
Platelets: 134 10*3/uL — ABNORMAL LOW (ref 150–400)
RBC: 2.87 MIL/uL — AB (ref 4.22–5.81)
RDW: 18 % — ABNORMAL HIGH (ref 11.5–15.5)
WBC: 5.4 10*3/uL (ref 4.0–10.5)

## 2016-02-22 LAB — BODY FLUID CELL COUNT WITH DIFFERENTIAL
EOS FL: 0 %
Lymphs, Fluid: 90 %
Monocyte-Macrophage-Serous Fluid: 9 % — ABNORMAL LOW (ref 50–90)
Neutrophil Count, Fluid: 1 % (ref 0–25)
WBC FLUID: 306 uL (ref 0–1000)

## 2016-02-22 LAB — BASIC METABOLIC PANEL
ANION GAP: 8 (ref 5–15)
BUN: 13 mg/dL (ref 6–20)
CO2: 27 mmol/L (ref 22–32)
Calcium: 8.6 mg/dL — ABNORMAL LOW (ref 8.9–10.3)
Chloride: 99 mmol/L — ABNORMAL LOW (ref 101–111)
Creatinine, Ser: 1.23 mg/dL (ref 0.61–1.24)
GFR calc Af Amer: >60 mL/min (ref >60)
GFR, EST NON AFRICAN AMERICAN: 55 mL/min — AB (ref >60)
GLUCOSE: 100 mg/dL — AB (ref 65–99)
POTASSIUM: 4.1 mmol/L (ref 3.5–5.1)
Sodium: 134 mmol/L — ABNORMAL LOW (ref 135–145)

## 2016-02-22 LAB — PROTEIN, TOTAL: Total Protein: 7.2 g/dL (ref 6.5–8.1)

## 2016-02-22 LAB — LACTATE DEHYDROGENASE, PLEURAL OR PERITONEAL FLUID: LD FL: 95 U/L — AB (ref 3–23)

## 2016-02-22 LAB — PROTEIN, BODY FLUID: Total protein, fluid: <3 g/dL

## 2016-02-22 LAB — LACTATE DEHYDROGENASE: LDH: 152 U/L (ref 98–192)

## 2016-02-22 LAB — ALBUMIN
ALBUMIN: 2.8 g/dL — AB (ref 3.5–5.0)
Albumin: 2.9 g/dL — ABNORMAL LOW (ref 3.5–5.0)

## 2016-02-22 LAB — ALBUMIN, FLUID (OTHER): Albumin, Fluid: 1.7 g/dL

## 2016-02-22 MED ORDER — PERFLUTREN LIPID MICROSPHERE
1.0000 mL | INTRAVENOUS | Status: AC | PRN
  Administered 2016-02-22: 3 mL via INTRAVENOUS
  Filled 2016-02-22: qty 10

## 2016-02-22 MED ORDER — PERFLUTREN LIPID MICROSPHERE
INTRAVENOUS | Status: AC
  Filled 2016-02-22: qty 10

## 2016-02-22 MED ORDER — KETOROLAC TROMETHAMINE 15 MG/ML IJ SOLN: 15.0000 mg | Freq: Once | INTRAMUSCULAR | Status: DC

## 2016-02-22 MED ORDER — FUROSEMIDE 10 MG/ML IJ SOLN
40.0000 mg | Freq: Once | INTRAMUSCULAR | Status: AC
  Administered 2016-02-22: 40 mg via INTRAVENOUS
  Filled 2016-02-22: qty 4

## 2016-02-22 NOTE — Progress Notes (Signed)
Nursing Note: Slept well.Denies any problems with SOB.Pt has been up to the bathroom w/o any problems.Pt has already given himself a "wash-up",and is ready for breakfast.Pt is very pleasant.wbb

## 2016-02-22 NOTE — Progress Notes (Signed)
Oncology Nurse Navigator Documentation  Oncology Nurse Navigator Flowsheets 02/22/2016  Navigator Location [facility]  Navigator Encounter Type Treatment  Patient Visit Type Inpatient  Treatment Phase Treatment  Barriers/Navigation Needs Coordination of Care  Interventions Coordination of Care  Coordination of Care Other  Acuity Level 1  Acuity Level 1 Minimal follow up required  Time Spent with Patient 30   I was notified yesterday that patient was admitted to hospital.  I went to see him today.  He states he feels better and he looks good.  I updated Dr. Julien Nordmann on patient status, no orders received at this time.

## 2016-02-22 NOTE — Telephone Encounter (Signed)
Currently admitted.

## 2016-02-22 NOTE — Care Management Obs Status (Signed)
[facility] NOTIFICATION   Patient Details  [name] MRN: [id number] Date of Birth: 11/17/1940   Medicare Observation Status Notification Given:  Yes    Lynnell Catalan, RN 02/22/2016, 2:46 PM

## 2016-02-22 NOTE — Progress Notes (Signed)
Echocardiogram 2D Echocardiogram with Definity has been performed.  Tresa Res 02/22/2016, 10:35 AM

## 2016-02-22 NOTE — Progress Notes (Signed)
PROGRESS NOTE                                                                                                                                                                                                             Patient Demographics:    Paul Sandoval, is a 75 y.o. male, DOB - February 16, 1941, IOE:[id number]  Admit date - 02/21/2016   Admitting Physician Edwin Dada, MD  Outpatient Primary MD for the patient is [name], MD  LOS - 1  No chief complaint on file.      Brief Narrative    Paul Sandoval is a 75 y.o. male with a past medical history significant for NSCLC Stage IIIA (T2b, N2, M0), COPD, peripheral vascular disease, and CAD s/p CABG and decreased LV function EF 45% in 2015 who presents with fever and cough.  The patient was diagnosed with lung cancer in March and completed radiation + carboplatin/paclitaxel therapy in early June. He was admitted here 6/11/-6/14 with fever and cough, diagnosed with CAP. That hospitalization was complicated by hyponatremia which was treated with salt tabs. After discharge he came to the [place] same-day clinic on 6/22 with persistent low-grade fevers (100.23F) and non-productive cough and so his antibiotics were continued 5 days.   Since then, he has had continued cough and intermittent low grade fevers. He has some mild ankle swelling and orthopnea at night that resolves with sitting up in a recliner. He has been walking laps in his house 4x per day, and not felt SOB with exertion. He is not producing sputum, has no chest pain, pleuritic pain. His albuterol is not helping.   Today he had his follow up at [facility] and was complaining of above. Was afebrile but tachycardic. CXR was repeated that showed worsening RUL opacity, and so [facility] was asked to accept as direct admit from [facility] for pneumonia failed outpatient therapy. Plan was discussed  with Dr. Irene Limbo on-call to go ahead and order staging CT planned for next week.    Subjective:    Paul Sandoval today has, No headache, No chest pain, No abdominal pain - No Nausea, No new weakness tingling or numbness, No Cough - Positive orthopnea and PND. Mild generalized weakness.   Assessment  & Plan :     1.Shortness of breath which actually is orthopnea  and PND with elevated BNP. Patient has history of lung cancer with CT evidence of right-sided consolidation and bilateral pleural effusion.  Although CT scan appears to be consistent with pneumonia patient is surprisingly nontoxic appearing, pro-calcitonin is stable, no fevers or leukocytosis, no cough. His BNP is elevated and he has trace edema on exam. His symptoms are more consistent with acute on chronic systolic CHF, will get echo, trial of IV Lasix and monitor. He is currently sitting in chair symptom free without any oxygen demand. We'll continue to monitor.  Have ordered sputum Gram stain culture and blood cultures if he mounts of fever, pulmonary is also following. Plan is to get diagnostic and therapeutic thoracentesis if fluid is exudate of then we may consider empiric antibiotics. He is at risk of developing pneumonia due to his malignancy with possibility of postobstructive infection. Of note he had IV antibiotics and long course of oral antibiotics recently.   2. Underlying COPD. At baseline no wheezing. Supportive care.  3. History of CAD with essential hypertension. No chest pain. Likely has underlying chronic systolic CHF last EF was 95% in 2015. Plan is to continue aspirin, Plavix, beta blocker and statin. Trial of IV Lasix. A repeat echogram. Thereafter follow with primary cardiologist outpatient post discharge.  4. 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 No need for packed RBC transfusion or Platelet replacement at this time.  5. CK D stage II. Stable.  6. GERD. On PPI.  7.  Dyslipidemia. On statin.  8. Right Lung NSCLC Stage IIIA (T2b, N2, M0) - undergoing chemotherapy, for now plan as in #1 above thereafter follow up with Dr. Julien Nordmann in the [place].    Family Communication  :  None present  Code Status :  Full  Diet : Heart Healthy  Disposition Plan  :  Stay inpatient  Consults  : Pulmonary  Procedures  :     TTE  CT Chest - Right-sided consolidation with bilateral pleural effusion, reduction in lung cancer and lymph node tumor burden.  DVT Prophylaxis  :  Lovenox   Lab Results  Component Value Date   PLT 134* 02/22/2016    Inpatient Medications  Scheduled Meds: . aspirin EC  81 mg Oral Daily  . atorvastatin  40 mg Oral q1800  . clopidogrel  75 mg Oral Q breakfast  . enoxaparin (LOVENOX) injection  40 mg Subcutaneous Q24H  . ferrous sulfate  325 mg Oral Daily  . fluticasone furoate-vilanterol  1 puff Inhalation Daily  . ketorolac  15 mg Intravenous Once  . metoprolol  50 mg Oral BID  . pantoprazole  40 mg Oral Daily   Continuous Infusions:  PRN Meds:.acetaminophen **OR** acetaminophen, albuterol, docusate sodium  Antibiotics  :    Anti-infectives    Start     Dose/Rate Route Frequency Ordered Stop   02/22/16 0000  ceFEPIme (MAXIPIME) 2 g in dextrose 5 % 50 mL IVPB  Status:  Discontinued     2 g 100 mL/hr over 30 Minutes Intravenous Every 8 hours 02/21/16 1635 02/21/16 1641   02/21/16 1700  ceFEPIme (MAXIPIME) 2 g in dextrose 5 % 50 mL IVPB  Status:  Discontinued     2 g 100 mL/hr over 30 Minutes Intravenous STAT 02/21/16 1620 02/21/16 1641         Objective:   Filed Vitals:   02/21/16 2115 02/22/16 0045 02/22/16 0505 02/22/16 1022  BP: 140/96 118/60 132/78   Pulse: 117 104 105  Temp: 98.2 F (36.8 C)  98.2 F (36.8 C)   TempSrc: Oral  Oral   Resp: 20  18   Height:      Weight:      SpO2: 99%  95% 93%    Wt Readings from Last 3 Encounters:  02/21/16 85.276 kg (188 lb)  02/21/16 85.276 kg (188 lb)    02/16/16 87.499 kg (192 lb 14.4 oz)     Intake/Output Summary (Last 24 hours) at 02/22/16 1213 Last data filed at 02/22/16 0735  Gross per 24 hour  Intake    360 ml  Output   1550 ml  Net  -1190 ml     Physical Exam  Awake Alert, Oriented X 3, No new F.N deficits, Normal affect Jenkins.AT,PERRAL Supple Neck,No JVD, No cervical lymphadenopathy appriciated.  Symmetrical Chest wall movement, Good air movement bilaterally, few rales RRR,No Gallops,Rubs or new Murmurs, No Parasternal Heave +ve B.Sounds, Abd Soft, No tenderness, No organomegaly appriciated, No rebound - guarding or rigidity. No Cyanosis, Clubbing , trace edema, No new Rash or bruise       Data Review:    CBC  Recent Labs Lab 02/16/16 1400 02/21/16 1116 02/22/16 0325  WBC 6.8 7.1 5.4  HGB 8.6* 8.8* 8.1*  HCT 26.7* 27.6* 24.9*  PLT 129* 126* 134*  MCV 89.0 89.6 86.8  MCH 28.6 28.6 28.2  MCHC 32.2 31.9* 32.5  RDW 20.2* 18.4* 18.0*  LYMPHSABS 0.4* 0.7*  --   MONOABS 0.6 0.7  --   EOSABS 0.2 0.2  --   BASOSABS 0.0 0.0  --     Chemistries   Recent Labs Lab 02/16/16 1400 02/21/16 1116 02/22/16 0325  NA 133* 134* 134*  K 3.8 4.6 4.1  CL  --   --  99*  CO2 '27 28 27  '$ GLUCOSE 115 83 100*  BUN 11.8 10.2 13  CREATININE 1.2 1.1 1.23  CALCIUM 8.6 9.1 8.6*  AST 24 22  --   ALT 26 17  --   ALKPHOS 98 93  --   BILITOT 0.53 0.45  --    ------------------------------------------------------------------------------------------------------------------ No results for input(s): CHOL, HDL, LDLCALC, TRIG, CHOLHDL, LDLDIRECT in the last 72 hours.  No results found for: HGBA1C ------------------------------------------------------------------------------------------------------------------ No results for input(s): TSH, T4TOTAL, T3FREE, THYROIDAB in the last 72 hours.  Invalid input(s): FREET3 ------------------------------------------------------------------------------------------------------------------ No  results for input(s): VITAMINB12, FOLATE, FERRITIN, TIBC, IRON, RETICCTPCT in the last 72 hours.  Coagulation profile No results for input(s): INR, PROTIME in the last 168 hours.  No results for input(s): DDIMER in the last 72 hours.  Cardiac Enzymes No results for input(s): CKMB, TROPONINI, MYOGLOBIN in the last 168 hours.  Invalid input(s): CK ------------------------------------------------------------------------------------------------------------------    Component Value Date/Time   BNP 942.6* 02/21/2016 1552    Micro Results No results found for this or any previous visit (from the past 240 hour(s)).  Radiology Reports Dg Chest 2 View  02/21/2016  CLINICAL DATA:  Stage IV lung malignancy status post chemotherapy and radiation; recent episode of pneumonia; increase shortness of breath and orthopnea. EXAM: CHEST  2 VIEW COMPARISON:  PA and lateral chest x-ray of February 16, 2016 FINDINGS: The right upper lobe density has become more confluent. The right lower lung is adequately inflated. There is a small right pleural effusion which is unchanged. The left lung is clear. There are post CABG changes. There is no cardiomegaly or pulmonary edema. The bony thorax exhibits no acute abnormality. IMPRESSION: Worsening of alveolar opacity  in the right upper lobe which may reflect pneumonia either typical or postobstructive. Lymphangitic spread of malignancy could be present as well but is fell less likely than progressive pneumonia. There is no CHF. There is a small right pleural effusion. Electronically Signed   By: David  Martinique M.D.   On: 02/21/2016 11:02   Dg Chest 2 View  02/16/2016  CLINICAL DATA:  Shortness of breath with exertion, fever, cough EXAM: CHEST  2 VIEW COMPARISON:  Chest x-ray of 02/05/2016 FINDINGS: There is parenchymal opacity within the right upper lobe consistent with pneumonia. The left lung is clear. Mediastinal hilar contours are unremarkable. Followup PA and lateral chest  X-ray is recommended in 3-4 weeks following trial of antibiotic therapy to ensure resolution and exclude underlying malignancy. The heart is within normal limits in size. No bony abnormality is seen. Median sternotomy sutures are noted from CABG. IMPRESSION: Right upper lobe opacity most consistent with pneumonia. Followup PA and lateral chest X-ray is recommended in 3-4 weeks following trial of antibiotic therapy to ensure resolution and exclude underlying malignancy. Electronically Signed   By: Ivar Drape M.D.   On: 02/16/2016 15:19   Dg Chest 2 View  02/05/2016  CLINICAL DATA:  Acute onset of fever. Recently finished chemotherapy for right-sided lung cancer. Initial encounter. EXAM: CHEST  2 VIEW COMPARISON:  Chest radiograph performed 11/24/2015, and PET/CT performed 11/25/2015 FINDINGS: The lungs are well-aerated. New patchy right-sided airspace opacity raises concern for pneumonia. The known pulmonary mass is less well characterized on the current study. The left lung appears relatively clear. No pleural effusion or pneumothorax is seen. The heart is normal in size. The patient is status post median sternotomy, with evidence of prior CABG. No acute osseous abnormalities are seen. Scattered clips are noted about the upper abdomen. IMPRESSION: New patchy right-sided airspace opacity raises concern for pneumonia. Known pulmonary mass is less well characterized on the current study. Electronically Signed   By: Garald Balding M.D.   On: 02/05/2016 21:52   Ct Chest W Contrast  02/21/2016  CLINICAL DATA:  75 year old with current history of squamous cell carcinoma of the right upper lobe, treated with radiation therapy and chemotherapy, completed earlier this month. Patient was admitted 02/05/2016 with community acquired pneumonia. He presents again today with cough and fever. EXAM: CT CHEST WITH CONTRAST TECHNIQUE: Multidetector CT imaging of the chest was performed during intravenous contrast administration.  CONTRAST:  3m ISOVUE-300 IOPAMIDOL (ISOVUE-300) INJECTION 61% IV. COMPARISON:  CT CT 11/25/2015.  CTA chest 11/11/2015, 03/10/2012. FINDINGS: Cardiovascular: Normal heart size. Extensive 3 vessel coronary atherosclerosis. Prior sternotomy for CABG. Visualized portions of the coronary grafts patent. No pericardial effusion moderate to severe atherosclerosis involving the thoracic and upper abdominal aorta without aneurysm. Atherosclerosis involving the proximal great vessels without visible stenosis. Mediastinum/Lymph Nodes: Interval marked decrease in size of the pathologic low right paratracheal mediastinal lymph node (station 4R) since the PET-CT, current measurement approximating 1.1 x 1.3 cm (previously 2.0 x 1.8 cm). The hypermetabolic right axillary lymph node on the PET-CT is unchanged to perhaps slightly decreased in size. No new or enlarging lymphadenopathy. Small hiatal hernia. Thyroid gland unremarkable. Lungs/Pleura: Since the PET-CT, marked reduction in size of the right upper lobe lung mass, now measuring approximately 2.2 x 1.9 cm (series 5, image 37), previously 4.3 x 3.4 cm. Interval development of confluent airspace opacities with air bronchograms in the right upper lobe and in the superior segment right lower lobe, associated with a large right pleural effusion.  Patchy airspace opacities are present medially in the left upper lobe. Small left pleural effusion. Upper abdomen: Surgically absent gallbladder. The he visualized proximal portion of the abdominal aortic stent graft is unremarkable. No acute findings. 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 No evidence of osseous metastatic disease. IMPRESSION: 1. Confluent airspace pneumonia involving the right upper lobe and the superior segment right lower lobe, with patchy pneumonia involving the left upper lobe. The right upper lobe pneumonia is superimposed upon post radiation changes. 2. Large right  pleural effusion and small left pleural effusion. 3. Interval marked reduction in size of the right upper lobe lung cancer since the prior PET-CT 11/25/2015. Measurements are given above. 4. Interval reduction in size of the pathologic station 4R mediastinal lymph node since the prior PET-CT. No new or enlarging lymphadenopathy. Electronically Signed   By: Evangeline Dakin M.D.   On: 02/21/2016 19:10   Dg Chest Port 1 View  02/22/2016  CLINICAL DATA:  Pleural effusion, shortness of Breath EXAM: PORTABLE CHEST 1 VIEW COMPARISON:  Chest x-ray and CT 02/21/2016 FINDINGS: Consolidation in the right upper lobe compatible with pneumonia previously seen effusion by CT not definitively visualized on this single view portable chest x-ray. No focal opacity on the left. Prior CABG. Heart is normal size. No acute bony abnormality. IMPRESSION: Continued consolidation in the right upper lobe most compatible with pneumonia. Previously seen pleural effusions cannot be visualized on this portable study. Electronically Signed   By: Rolm Baptise M.D.   On: 02/22/2016 11:53   Ct Angio Abd/pel W/ And/or W/o  01/25/2016  CLINICAL DATA:  Post stent graft EXAM: CTA ABDOMEN AND PELVIS wITHOUT AND WITH CONTRAST TECHNIQUE: Multidetector CT imaging of the abdomen and pelvis was performed using the standard protocol during bolus administration of intravenous contrast. Multiplanar reconstructed images and MIPs were obtained and reviewed to evaluate the vascular anatomy. 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 There is no evidence of endoleak. Celiac is patent.  Branch vessels patent. 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 This has progressed since the prior study. 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  Branches reconstitute The  left renal artery is patent. There continues to be atherosclerotic disease at the origin of the right renal artery. Significant stenosis cannot be excluded. The appearance is stable. Right common iliac artery landing zone is patent. Internal and external iliac arteries are patent Left common iliac artery landing zone is patent. The continues to be a small penetrating atherosclerotic ulcer just be on the landing zone. Left internal and external iliac arteries are patent. Postcholecystectomy Liver, spleen, pancreas, and adrenal glands are within normal limits. Small hypodensities in the kidneys are stable. Normal appendix. Sigmoid diverticulosis without acute diverticulitis. No focal obstructing lesion of the colon Left total hip arthroplasty. Mild enlargement of the prostate gland. Degenerative disc disease and facet arthropathy at L4-5. No vertebral compression deformity. There is no free-fluid.  No obvious retroperitoneal adenopathy. Review of the MIP images confirms the above findings. IMPRESSION: Aorto bi-iliac stent graft remains patent and there is no evidence of endoleak. 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 Electronically Signed   By: Rodena Goldmann.D.  On: 01/25/2016 16:38    Time Spent in minutes  30   Malanie Koloski K M.D on 02/22/2016 at 12:13 PM  Between 7am to 7pm - Pager - [phone]  After 7pm go to [email or web address] - password [facility]  [facility] -  Office  [phone]

## 2016-02-22 NOTE — Plan of Care (Signed)
Problem: Pain Managment: Goal: General experience of comfort will improve Outcome: Progressing Mild pain at thoracentesis site, declines pain med

## 2016-02-22 NOTE — Consult Note (Signed)
[name] MRN: [id number] DOB: 1941-02-10    ADMISSION DATE:  02/21/2016 CONSULTATION DATE:  02/22/16  REFERRING MD :  Dr. Candiss Norse / [facility]   CHIEF COMPLAINT:  SOB, Pleural Effusion.    HISTORY OF PRESENT ILLNESS:  75 y/o M, former smoker, with PMH of CAD s/p CABG (1996), AAA s/p repair (2014), HTN, HLD, RBBB, ICM (LVEF 45% in 2015), GERD, diverticulosis, colon polyps, arthritis, COPD and Stage IIIA (T2b, N2, M0) RUL non-small cell lung cancer (diagnosed in 10/2015 after episode of hemoptysis) who presented to [facility] on 02/21/16 with reports on fever and cough.    The patient was diagnosed with Stage IIIA (T2b, N2, M0) RUL non-small cell lung cancer in March of 2017.  He has been treated with radiation and carboplatin/paclitaxel therapy by Dr. Earlie Server / Dr. Tammi Klippel.  Most recently, he was admitted 6/11-6/14 with fever, cough and pneumonia.  He was treated with levaquin inpatient and given 3 more days at discharge.  That hospitalization was also notable for hyponatremia and he was treated with salt tabs with improvement.  Due to hyponatremia, HCTZ and Lisinopril were held at discharge.  After discharge, he followed up 6/15 with his PCP and reported improvement.  On 6/22, he was called by the [facility] nurse navigator and reported not feeling well with persistent low grade fevers & cough.  An XRAY was completed which showed known RUL PNA.  He was seen again on 6/23 and given 5 days more of levaquin    He follow up again at the [facility] on 6/27 for an acute visit.  He reported difficulty lying flat.  Given concerns for outpatient failure of abx, he was sent for admission.   On admission, he reported ongoing intermittent fevers (tmax 100.1) and cough.  He denied sputum production, chest pain, pain with inspiration.  He also reported feeling like his heart had been racing and lower extremity swelling.  He reports he checks his blood pressure every day / monitors his pulse and it has been  elevated.  At his baseline, he has walked 2 miles per day since his heart surgery in 1996. CXR on admission showed RUL airspace disease.  Labs - Na 134, K 4.6, Cl 97, sr cr 1.1, BNP 942, PCT <0.10, WBC 7.1, Hgb 8.8, and platelets 126.  CT of the chest was assessed which showed confluent airspace pneumonia involving the RUL & superior segment RLL, with patchy PNA superimposed on radiation changes, large right pleural effusion, decreased size of RUL lung cancer and lymph nodes.    The patient was given one dose of lasix since admit.  An ECHO was assessed with results pending.  ABX were held.  PCCM consulted for evaluation of pleural effusion and possible PNA.    PAST MEDICAL HISTORY :   has a past medical history of Arteriosclerotic cardiovascular disease (ASCVD) (1996); AAA (abdominal aortic aneurysm) ([place]) (2010); Hypertension; Hyperlipidemia; Tobacco abuse, in remission; GERD (gastroesophageal reflux disease); Right bundle branch block; Diverticulosis; Colonic polyp (2002); COPD (chronic obstructive pulmonary disease) ([place]); CAD (coronary artery disease); ED (erectile dysfunction); IFG (impaired fasting glucose); Chronic rhinitis; Myocardial infarction ([place]); Pneumonia (~ 2001; ~ 2005); Chronic bronchitis ([place]); Arthritis; Cardiomyopathy, ischemic; Encounter for antineoplastic chemotherapy (12/19/2015); and Cancer ([place]).  has past surgical history that includes Colonoscopy (2002); Laparoscopic cholecystectomy (12/2009); Abdominal aortic endovascular stent graft (N/A, 12/11/2012); Abdominal aortic aneurysm repair (11/2012); Total hip arthroplasty (Left, 01/21/2013); Joint replacement; Coronary artery bypass graft (01/09/1995); Cardiac catheterization (01/08/1995); Coronary angioplasty  with stent (03/18/2014); Coronary angioplasty with stent (03/24/2014); left and right heart catheterization with coronary/graft angiogram (N/A, 03/18/2014); percutaneous coronary stent intervention (pci-s) (03/18/2014); percutaneous  coronary stent intervention (pci-s) (N/A, 03/24/2014); and Video bronchoscopy (N/A, 11/17/2015).    Prior to Admission medications   Medication Sig Start Date End Date Taking? Authorizing Provider  acetaminophen (TYLENOL) 500 MG tablet Take 500 mg by mouth every 6 (six) hours as needed for mild pain.   Yes Historical Provider, MD  aspirin EC 81 MG tablet Take 81 mg by mouth daily.   Yes Historical Provider, MD  clopidogrel (PLAVIX) 75 MG tablet Take 1 tablet (75 mg total) by mouth daily. Reported on 12/02/2015 01/26/16  Yes Josue Hector, MD  docusate sodium (COLACE) 100 MG capsule Take 100 mg by mouth daily as needed for mild constipation or moderate constipation.   Yes Historical Provider, MD  Ferrous Sulfate (IRON) 28 MG TABS Take 28 mg by mouth daily.   Yes Historical Provider, MD  fluticasone furoate-vilanterol (BREO ELLIPTA) 100-25 MCG/INH AEPB Inhale 1 puff into the lungs daily. 02/09/16  Yes Rigoberto Noel, MD  levofloxacin (LEVAQUIN) 750 MG tablet Take 1 tablet (750 mg total) by mouth daily. 02/16/16  Yes Susanne Borders, NP  metoprolol (LOPRESSOR) 50 MG tablet Take 50 mg by mouth 2 (two) times daily.    Yes Historical Provider, MD  Multiple Vitamins-Minerals (CENTRUM SILVER ADULT 50+) TABS Take 1 tablet by mouth daily.    Yes Historical Provider, MD  nitroGLYCERIN (NITROSTAT) 0.4 MG SL tablet Place 1 tablet (0.4 mg total) under the tongue every 5 (five) minutes as needed for chest pain. 03/23/15  Yes Josue Hector, MD  omeprazole (PRILOSEC) 20 MG capsule TAKE ONE CAPSULE BY MOUTH DAILY. Patient taking differently: TAKE 20 MG BY MOUTH DAILY. 01/09/16  Yes [name], MD  prochlorperazine (COMPAZINE) 10 MG tablet TAKE (1) TABLET BY MOUTH EVERY SIX HOURS AS NEEDED FOR NAUSEA AND VOMITING. 01/26/16  Yes Curt Bears, MD  simvastatin (ZOCOR) 80 MG tablet Take 0.5 tablets (40 mg total) by mouth at bedtime. 08/23/15  Yes [name], MD  VENTOLIN HFA 108 (90 Base) MCG/ACT inhaler INHALE 2  PUFFS BY MOUTH EVERY 4 TO 6 HOURS AS NEEDED FOR WHEEZING. 09/02/15  Yes [name], MD  sodium chloride 1 g tablet Take 2 tablets (2 g total) by mouth 2 (two) times daily with a meal. Patient not taking: Reported on 02/21/2016 02/08/16   Debbe Odea, MD  sucralfate (CARAFATE) 1 g tablet Dissolve in 8 oz water and drink 5 minutes prior to meals and at bedtime Patient not taking: Reported on 02/21/2016 01/05/16   Hayden Pedro, PA-C   Allergies  Allergen Reactions  . Neomycin Hives    FAMILY HISTORY:  family history includes Arthritis in his mother and sister; Cancer in his father; Heart disease in his father; Hypertension in his brother; Parkinsonism in his mother. There is no history of Colon cancer or Colon polyps.   SOCIAL HISTORY:  reports that he quit smoking about 21 years ago. His smoking use included Cigarettes. He started smoking about 59 years ago. He has a 45 pack-year smoking history. He has never used smokeless tobacco. He reports that he drinks alcohol. He reports that he does not use illicit drugs.  REVIEW OF SYSTEMS:  POSITIVES IN BOLD Constitutional: Negative for fever, chills, weight loss, malaise/fatigue and diaphoresis.  HENT: Negative for hearing loss, ear pain, nosebleeds, congestion, sore throat, neck  pain, tinnitus and ear discharge.   Eyes: Negative for blurred vision, double vision, photophobia, pain, discharge and redness.  Respiratory: Negative for non-productive cough, hemoptysis, sputum production, shortness of breath, wheezing and stridor.   Cardiovascular: Negative for chest pain, palpitations, orthopnea, claudication, leg swelling and PND.  Gastrointestinal: Negative for heartburn, nausea, vomiting, abdominal pain, diarrhea, constipation, blood in stool and melena.  Genitourinary: Negative for dysuria, urgency, frequency, hematuria and flank pain.  Musculoskeletal: Negative for myalgias, back pain, joint pain and falls.  Skin: Negative for itching  and rash.  Neurological: Negative for dizziness, tingling, tremors, sensory change, speech change, focal weakness, seizures, loss of consciousness, weakness and headaches.  Endo/Heme/Allergies: Negative for environmental allergies and polydipsia. Does not bruise/bleed easily.  SUBJECTIVE:   VITAL SIGNS: Temp:  [98 F (36.7 C)-98.2 F (36.8 C)] 98.2 F (36.8 C) (06/28 0505) Pulse Rate:  [103-117] 105 (06/28 0505) Resp:  [18-20] 18 (06/28 0505) BP: (118-148)/(60-96) 132/78 mmHg (06/28 0505) SpO2:  [95 %-99 %] 95 % (06/28 0505) Weight:  [185 lb 1.6 oz (83.961 kg)-188 lb (85.276 kg)] 188 lb (85.276 kg) (06/27 1338)  PHYSICAL EXAMINATION: General:  Well appearing adult male in NAD, sitting up in chair  Neuro:  AAOx4, speech clear, MAE  HEENT:  MM pink/moist, good dentition, no JVD  Cardiovascular:  s1s2 rrr, no m/r/g Lungs:  Even/non-labored on RA, diminished RLL, otherwise clear, dullness to percussion RLL. Abdomen:  Soft, non-tender, bsx4 active  Musculoskeletal:  No acute deformities  Skin:  Warm/dry, BLE 1-2+ pitting edema, symmetrical    Recent Labs Lab 02/16/16 1400 02/21/16 1116 02/22/16 0325  NA 133* 134* 134*  K 3.8 4.6 4.1  CL  --   --  99*  CO2 '27 28 27  '$ BUN 11.8 10.2 13  CREATININE 1.2 1.1 1.23  GLUCOSE 115 83 100*    Recent Labs Lab 02/16/16 1400 02/21/16 1116 02/22/16 0325  HGB 8.6* 8.8* 8.1*  HCT 26.7* 27.6* 24.9*  WBC 6.8 7.1 5.4  PLT 129* 126* 134*   Dg Chest 2 View  02/21/2016  CLINICAL DATA:  Stage IV lung malignancy status post chemotherapy and radiation; recent episode of pneumonia; increase shortness of breath and orthopnea. EXAM: CHEST  2 VIEW COMPARISON:  PA and lateral chest x-ray of February 16, 2016 FINDINGS: The right upper lobe density has become more confluent. The right lower lung is adequately inflated. There is a small right pleural effusion which is unchanged. The left lung is clear. There are post CABG changes. There is no cardiomegaly or  pulmonary edema. The bony thorax exhibits no acute abnormality. IMPRESSION: Worsening of alveolar opacity in the right upper lobe which may reflect pneumonia either typical or postobstructive. Lymphangitic spread of malignancy could be present as well but is fell less likely than progressive pneumonia. There is no CHF. There is a small right pleural effusion. Electronically Signed   By: David  Martinique M.D.   On: 02/21/2016 11:02   Ct Chest W Contrast  02/21/2016  CLINICAL DATA:  75 year old with current history of squamous cell carcinoma of the right upper lobe, treated with radiation therapy and chemotherapy, completed earlier this month. Patient was admitted 02/05/2016 with community acquired pneumonia. He presents again today with cough and fever. EXAM: CT CHEST WITH CONTRAST TECHNIQUE: Multidetector CT imaging of the chest was performed during intravenous contrast administration. CONTRAST:  20m ISOVUE-300 IOPAMIDOL (ISOVUE-300) INJECTION 61% IV. COMPARISON:  CT CT 11/25/2015.  CTA chest 11/11/2015, 03/10/2012. FINDINGS: Cardiovascular: Normal heart size. Extensive  3 vessel coronary atherosclerosis. Prior sternotomy for CABG. Visualized portions of the coronary grafts patent. No pericardial effusion moderate to severe atherosclerosis involving the thoracic and upper abdominal aorta without aneurysm. Atherosclerosis involving the proximal great vessels without visible stenosis. Mediastinum/Lymph Nodes: Interval marked decrease in size of the pathologic low right paratracheal mediastinal lymph node (station 4R) since the PET-CT, current measurement approximating 1.1 x 1.3 cm (previously 2.0 x 1.8 cm). The hypermetabolic right axillary lymph node on the PET-CT is unchanged to perhaps slightly decreased in size. No new or enlarging lymphadenopathy. Small hiatal hernia. Thyroid gland unremarkable. Lungs/Pleura: Since the PET-CT, marked reduction in size of the right upper lobe lung mass, now measuring approximately  2.2 x 1.9 cm (series 5, image 37), previously 4.3 x 3.4 cm. Interval development of confluent airspace opacities with air bronchograms in the right upper lobe and in the superior segment right lower lobe, associated with a large right pleural effusion. Patchy airspace opacities are present medially in the left upper lobe. Small left pleural effusion. Upper abdomen: Surgically absent gallbladder. The he visualized proximal portion of the abdominal aortic stent graft is unremarkable. No acute findings. 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 No evidence of osseous metastatic disease. IMPRESSION: 1. Confluent airspace pneumonia involving the right upper lobe and the superior segment right lower lobe, with patchy pneumonia involving the left upper lobe. The right upper lobe pneumonia is superimposed upon post radiation changes. 2. Large right pleural effusion and small left pleural effusion. 3. Interval marked reduction in size of the right upper lobe lung cancer since the prior PET-CT 11/25/2015. Measurements are given above. 4. Interval reduction in size of the pathologic station 4R mediastinal lymph node since the prior PET-CT. No new or enlarging lymphadenopathy. Electronically Signed   By: Evangeline Dakin M.D.   On: 02/21/2016 19:10    SIGNIFICANT EVENTS  6/27  Admit  6/28  PCCM consulted for pleural effusion evaluation   STUDIES:  CT Chest 6/27 >> confluent airspace pneumonia involving the RUL & superior segment RLL, with patchy PNA superimposed on radiation changes, large right pleural effusion, decreased size of RUL lung cancer and lymph nodes ECHO 6/28 >>   CULTURES / CYTOLOGY: Pleural Fluid 6/28  GS >>   Culture >>   LDH >>   Protein >>   Cytology >>     ASSESSMENT / PLAN:  1.  Right Pleural Effusion  2.  Stage IIIA (T2b, N2, M0) RUL non-small cell lung cancer  3.  Recent R PNA s/p Levaquin 4.  COPD 5.  Former Tobacco Abuse  6.  CAD  s/p CABG 7.  NICM  8.  HTN, HLD  Discussion:  75 y/o M with a PMH of Stage IIIA RUL NSCLCA, COPD, CAD, NICM, HTN and recent admission for PNA & hyponatremia.  Treated with levaquin and lisinopril / HCTZ held at discharge.  Re-admitted 6/27 with cough, orthopnea, intermittent fevers, lower extremity swelling.  Found to have RUL airspace disease and pleural effusion.   PCT negative, WBC negative, BNP elevated.  DDx includes ongoing PNA with associated parapneumonic effusion, decompensated CHF in the setting of chemotherapy and HCTZ being held post discharge & malignant effusion.    Plan: Proceed with right thoracentesis  Follow pleural studies  Toradol x1 for pleuritic pain, '15mg'$  CXR post thora  Await ECHO findings Strict I/O's  Consider repeating lasix in am 6/29 Monitor BMP with diuresis  Trend PCT  Assess sputum culture if pt  able to produce HOLD abx for now, if fever, rise in WBC / PCT or exudative pleural fluid consider abx   Thank you for the consultation, PCCM will follow along with you.   Noe Gens, NP-C [facility] Pgr: [phone] or if no answer [phone] 02/22/2016, 10:15 AM   Attending note: I have seen and examined the patient with nurse practitioner/resident and agree with the note. History, labs and imaging reviewed.  75 Y/O with recent diagnosis of Stage IIIA Sq cell ca. S/p chemo, XRT therapy. He had an episode of PNA earlier this month that was treated with abx Now readmitted with reported fever, cough. WBC and Pct are normal and he is afebrile. BNP is elevated.  Images reviewed. There is consolidation with air bronchograms in RUL, rt effusion DDx includes HCAP, radiation pneumonitis, CHF but the picture is not clear for any single entity.  We will proceed with thoracentesis. If exudative then start broad spectrum abx. Consider bronchoscopy. Diuresis as per primary team. Follow echo Repeat CXR in AM.  Marshell Garfinkel MD [facility] Pager [phone] If no answer or after 3pm call: [phone] 02/22/2016, 1:15 PM

## 2016-02-22 NOTE — Procedures (Signed)
Thoracentesis Procedure Note  Pre-operative Diagnosis: Right Pleural Effusion   Post-operative Diagnosis: same  Indications: Right Pleural Effusion   Procedure Details  Consent: Informed consent was obtained. Risks of the procedure were discussed including: infection, bleeding, pain, pneumothorax.  Under sterile conditions the patient was positioned. Betadine solution and sterile drapes were utilized.  1% plain lidocaine was used to anesthetize the 8th rib space. Fluid was obtained without any difficulties and minimal blood loss.  A dressing was applied to the wound and wound care instructions were provided.   Findings 1150 ml of clear yellow pleural fluid was obtained. A sample was sent to cell counts, infection analysis, cytology / pathology.  Complications:  None; patient tolerated the procedure well.          Condition: stable  Plan A follow up chest x-ray was ordered. Bed Rest for 0 hours. 1 time PRN Toradol for pain if needed   Noe Gens, NP-C [facility] Pgr: [phone] or if no answer [phone] 02/22/2016, 11:36 AM

## 2016-02-23 ENCOUNTER — Other Ambulatory Visit: Payer: Self-pay | Admitting: Physician Assistant

## 2016-02-23 ENCOUNTER — Observation Stay (HOSPITAL_COMMUNITY): Payer: Medicare Other

## 2016-02-23 DIAGNOSIS — Z8261 Family history of arthritis: Secondary | ICD-10-CM | POA: Diagnosis not present

## 2016-02-23 DIAGNOSIS — I2583 Coronary atherosclerosis due to lipid rich plaque: Secondary | ICD-10-CM | POA: Diagnosis not present

## 2016-02-23 DIAGNOSIS — E785 Hyperlipidemia, unspecified: Secondary | ICD-10-CM | POA: Diagnosis not present

## 2016-02-23 DIAGNOSIS — I255 Ischemic cardiomyopathy: Secondary | ICD-10-CM | POA: Diagnosis not present

## 2016-02-23 DIAGNOSIS — I5043 Acute on chronic combined systolic (congestive) and diastolic (congestive) heart failure: Secondary | ICD-10-CM | POA: Diagnosis not present

## 2016-02-23 DIAGNOSIS — R0601 Orthopnea: Secondary | ICD-10-CM | POA: Diagnosis not present

## 2016-02-23 DIAGNOSIS — D6959 Other secondary thrombocytopenia: Secondary | ICD-10-CM | POA: Diagnosis not present

## 2016-02-23 DIAGNOSIS — I2510 Atherosclerotic heart disease of native coronary artery without angina pectoris: Secondary | ICD-10-CM | POA: Diagnosis not present

## 2016-02-23 DIAGNOSIS — I4510 Unspecified right bundle-branch block: Secondary | ICD-10-CM | POA: Diagnosis not present

## 2016-02-23 DIAGNOSIS — I130 Hypertensive heart and chronic kidney disease with heart failure and stage 1 through stage 4 chronic kidney disease, or unspecified chronic kidney disease: Secondary | ICD-10-CM | POA: Diagnosis not present

## 2016-02-23 DIAGNOSIS — R509 Fever, unspecified: Secondary | ICD-10-CM | POA: Diagnosis not present

## 2016-02-23 DIAGNOSIS — D6481 Anemia due to antineoplastic chemotherapy: Secondary | ICD-10-CM | POA: Diagnosis not present

## 2016-02-23 DIAGNOSIS — I739 Peripheral vascular disease, unspecified: Secondary | ICD-10-CM | POA: Diagnosis not present

## 2016-02-23 DIAGNOSIS — I252 Old myocardial infarction: Secondary | ICD-10-CM | POA: Diagnosis not present

## 2016-02-23 DIAGNOSIS — K219 Gastro-esophageal reflux disease without esophagitis: Secondary | ICD-10-CM | POA: Diagnosis not present

## 2016-02-23 DIAGNOSIS — Z923 Personal history of irradiation: Secondary | ICD-10-CM | POA: Diagnosis not present

## 2016-02-23 DIAGNOSIS — J90 Pleural effusion, not elsewhere classified: Secondary | ICD-10-CM | POA: Insufficient documentation

## 2016-02-23 DIAGNOSIS — C3411 Malignant neoplasm of upper lobe, right bronchus or lung: Secondary | ICD-10-CM | POA: Diagnosis not present

## 2016-02-23 DIAGNOSIS — K449 Diaphragmatic hernia without obstruction or gangrene: Secondary | ICD-10-CM | POA: Diagnosis not present

## 2016-02-23 DIAGNOSIS — J449 Chronic obstructive pulmonary disease, unspecified: Secondary | ICD-10-CM

## 2016-02-23 DIAGNOSIS — I5023 Acute on chronic systolic (congestive) heart failure: Secondary | ICD-10-CM

## 2016-02-23 DIAGNOSIS — J45909 Unspecified asthma, uncomplicated: Secondary | ICD-10-CM

## 2016-02-23 DIAGNOSIS — K5730 Diverticulosis of large intestine without perforation or abscess without bleeding: Secondary | ICD-10-CM | POA: Diagnosis not present

## 2016-02-23 DIAGNOSIS — I714 Abdominal aortic aneurysm, without rupture: Secondary | ICD-10-CM | POA: Diagnosis not present

## 2016-02-23 DIAGNOSIS — N183 Chronic kidney disease, stage 3 (moderate): Secondary | ICD-10-CM | POA: Diagnosis not present

## 2016-02-23 DIAGNOSIS — R000 Tachycardia, unspecified: Secondary | ICD-10-CM | POA: Diagnosis not present

## 2016-02-23 DIAGNOSIS — R0602 Shortness of breath: Secondary | ICD-10-CM | POA: Diagnosis not present

## 2016-02-23 DIAGNOSIS — J189 Pneumonia, unspecified organism: Secondary | ICD-10-CM | POA: Diagnosis not present

## 2016-02-23 LAB — BASIC METABOLIC PANEL
ANION GAP: 9 (ref 5–15)
BUN: 18 mg/dL (ref 6–20)
CALCIUM: 8.5 mg/dL — AB (ref 8.9–10.3)
CHLORIDE: 97 mmol/L — AB (ref 101–111)
CO2: 28 mmol/L (ref 22–32)
Creatinine, Ser: 1.43 mg/dL — ABNORMAL HIGH (ref 0.61–1.24)
GFR calc non Af Amer: 46 mL/min — ABNORMAL LOW (ref >60)
GFR, EST AFRICAN AMERICAN: 53 mL/min — AB (ref >60)
Glucose, Bld: 123 mg/dL — ABNORMAL HIGH (ref 65–99)
Potassium: 3.6 mmol/L (ref 3.5–5.1)
Sodium: 134 mmol/L — ABNORMAL LOW (ref 135–145)

## 2016-02-23 LAB — MAGNESIUM: Magnesium: 1.9 mg/dL (ref 1.7–2.4)

## 2016-02-23 MED ORDER — FUROSEMIDE 40 MG PO TABS
40.0000 mg | ORAL_TABLET | Freq: Every day | ORAL | Status: DC
  Administered 2016-02-23 – 2016-02-25 (×3): 40 mg via ORAL
  Filled 2016-02-23 (×3): qty 1

## 2016-02-23 MED ORDER — DOCUSATE SODIUM 100 MG PO CAPS
200.0000 mg | ORAL_CAPSULE | Freq: Every day | ORAL | Status: DC | PRN
  Administered 2016-02-23: 200 mg via ORAL
  Filled 2016-02-23: qty 2

## 2016-02-23 MED ORDER — PREDNISONE 20 MG PO TABS
60.0000 mg | ORAL_TABLET | Freq: Every day | ORAL | Status: DC
  Administered 2016-02-23 – 2016-02-25 (×3): 60 mg via ORAL
  Filled 2016-02-23 (×3): qty 3

## 2016-02-23 NOTE — Progress Notes (Addendum)
PROGRESS NOTE                                                                                                                                                                                                             Patient Demographics:    Paul Sandoval, is a 75 y.o. male, DOB - 30-Sep-1940, IOM:[id number]  Admit date - 02/21/2016   Admitting Physician Edwin Dada, MD  Outpatient Primary MD for the patient is [name], MD  LOS - 2  No chief complaint on file.      Brief Narrative    Paul Sandoval is a 75 y.o. male with a past medical history significant for NSCLC Stage IIIA (T2b, N2, M0), COPD, peripheral vascular disease, and CAD s/p CABG and decreased LV function EF 45% in 2015 who presents with fever and cough.  The patient was diagnosed with lung cancer in March and completed radiation + carboplatin/paclitaxel therapy in early June. He was admitted here 6/11/-6/14 with fever and cough, diagnosed with CAP. That hospitalization was complicated by hyponatremia which was treated with salt tabs. After discharge he came to the [place] same-day clinic on 6/22 with persistent low-grade fevers (100.68F) and non-productive cough and so his antibiotics were continued 5 days.   Since then, he has had continued cough and intermittent low grade fevers. He has some mild ankle swelling and orthopnea at night that resolves with sitting up in a recliner. He has been walking laps in his house 4x per day, and not felt SOB with exertion. He is not producing sputum, has no chest pain, pleuritic pain. His albuterol is not helping.   Today he had his follow up at [facility] and was complaining of above. Was afebrile but tachycardic. CXR was repeated that showed worsening RUL opacity, and so [facility] was asked to accept as direct admit from [facility] for pneumonia failed outpatient therapy. Plan was discussed  with Dr. Irene Limbo on-call to go ahead and order staging CT planned for next week.    Subjective:    Paul Sandoval today has, No headache, No chest pain, No abdominal pain - No Nausea, No new weakness tingling or numbness, No Cough - Improved orthopnea and shortness of breath.   Assessment  & Plan :     1.Shortness of breath which actually is orthopnea and  PND with elevated BNP. Patient has history of lung cancer with CT evidence of right-sided consolidation and bilateral pleural effusion. Symptoms most likely CHF related.  Although CT scan appears to be consistent with pneumonia patient is surprisingly nontoxic appearing, pro-calcitonin is stable, no fevers or leukocytosis, no cough. His BNP is elevated and he has trace edema on exam. His symptoms are more consistent with acute on chronic systolic CHF, Better after IV Lasix, echo shows worsened EF since his last echo 2 years ago. He is currently sitting in chair symptom free without any oxygen demand. He is better after thoracentesis and diuresis with Lasix.  Currently I do not think he has pneumonia, thoracentesis shows borderline exudative/transudative fluid as well, pulmonary following. He he does remain at risk of developing pneumonia due to his malignancy with possibility of postobstructive infection. Of note he had IV antibiotics and long course of oral antibiotics recently.   2. Underlying COPD. At baseline no wheezing. Supportive care.  3. History of CAD with essential hypertension. No chest pain. Likely has underlying chronic systolic CHF last EF 54% down from 45-50% 2 years ago. Plan is to continue aspirin, Plavix, beta blocker and statin. Symptoms improved with Lasix which will be continued, cardiology consulted. We will monitor closely.   4. 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 No need for packed RBC transfusion or Platelet replacement at this time.  5. CK D stage III. Baseline creatinine close to  1.4 he is at baseline monitor with diuresis.  6. GERD. On PPI.  7. Dyslipidemia. On statin.  8. Right Lung NSCLC Stage IIIA (T2b, N2, M0) - undergoing chemotherapy, for now plan as in #1 above thereafter follow up with Dr. Julien Nordmann in the [place].    Family Communication  :  None present  Code Status :  Full  Diet : Heart Healthy  Disposition Plan  :  Stay inpatient  Consults  : Pulmonary  Procedures  :    R .thoracentesis by PCCM 02-14-16 - 1lit borderline transudative/exudative fluid removed.   TTE  Left ventricle: There is diffuse hypokinesis and akinesis of the mid anteroseptal, aterior and anterolateral walls and apical anterior and septal walls. The cavity size was normal. There was mild concentric hypertrophy. Systolic function was moderately to severely reduced. The estimated ejection fraction was in the range of 30% to 35%. Features are consistent with a pseudonormal left ventricular filling pattern, with concomitant abnormal relaxation and increased filling pressure (grade 2 diastolic dysfunction). Doppler parameters are consistent with elevated ventricular end-diastolic filling pressure. - Aortic valve: Trileaflet; normal thickness leaflets. There was no regurgitation. - Aortic root: The aortic root was normal in size. - Mitral valve: Structurally normal valve. There was mild regurgitation. - Left atrium: The atrium was mildly dilated. - Right ventricle: The cavity size was normal. Wall thickness was normal. Systolic function was normal. - Right atrium: The atrium was normal in size. - Tricuspid valve: There was mild regurgitation. - Pulmonic valve: There was trivial regurgitation. - Pulmonary arteries: Systolic pressure was within the normal range. - Inferior vena cava: The vessel was normal in size. - Pericardium, extracardiac: There was no pericardial effusion.   CT Chest - Right-sided consolidation with bilateral pleural effusion, reduction in lung cancer  and lymph node tumor burden.  DVT Prophylaxis  :  Lovenox   Lab Results  Component Value Date   PLT 134* 02/22/2016    Inpatient Medications  Scheduled Meds: . aspirin EC  81  mg Oral Daily  . atorvastatin  40 mg Oral q1800  . clopidogrel  75 mg Oral Q breakfast  . enoxaparin (LOVENOX) injection  40 mg Subcutaneous Q24H  . ferrous sulfate  325 mg Oral Daily  . fluticasone furoate-vilanterol  1 puff Inhalation Daily  . ketorolac  15 mg Intravenous Once  . metoprolol  50 mg Oral BID  . pantoprazole  40 mg Oral Daily   Continuous Infusions:  PRN Meds:.acetaminophen **OR** acetaminophen, albuterol, docusate sodium  Antibiotics  :    Anti-infectives    Start     Dose/Rate Route Frequency Ordered Stop   02/22/16 0000  ceFEPIme (MAXIPIME) 2 g in dextrose 5 % 50 mL IVPB  Status:  Discontinued     2 g 100 mL/hr over 30 Minutes Intravenous Every 8 hours 02/21/16 1635 02/21/16 1641   02/21/16 1700  ceFEPIme (MAXIPIME) 2 g in dextrose 5 % 50 mL IVPB  Status:  Discontinued     2 g 100 mL/hr over 30 Minutes Intravenous STAT 02/21/16 1620 02/21/16 1641         Objective:   Filed Vitals:   02/22/16 1022 02/22/16 1418 02/22/16 2120 02/23/16 0639  BP:  105/59 134/71 130/66  Pulse:  113 118 114  Temp:  98.4 F (36.9 C) 98.2 F (36.8 C) 97.6 F (36.4 C)  TempSrc:  Oral  Oral  Resp:  18  18  Height:      Weight:      SpO2: 93% 94% 94% 93%    Wt Readings from Last 3 Encounters:  02/21/16 85.276 kg (188 lb)  02/21/16 85.276 kg (188 lb)  02/16/16 87.499 kg (192 lb 14.4 oz)     Intake/Output Summary (Last 24 hours) at 02/23/16 1014 Last data filed at 02/23/16 0949  Gross per 24 hour  Intake    780 ml  Output   1700 ml  Net   -920 ml     Physical Exam  Awake Alert, Oriented X 3, No new F.N deficits, Normal affect [place].AT,PERRAL Supple Neck,No JVD, No cervical lymphadenopathy appriciated.  Symmetrical Chest wall movement, Good air movement bilaterally, few rales RRR,No  Gallops,Rubs or new Murmurs, No Parasternal Heave +ve B.Sounds, Abd Soft, No tenderness, No organomegaly appriciated, No rebound - guarding or rigidity. No Cyanosis, Clubbing , trace edema, No new Rash or bruise       Data Review:    CBC  Recent Labs Lab 02/16/16 1400 02/21/16 1116 02/22/16 0325  WBC 6.8 7.1 5.4  HGB 8.6* 8.8* 8.1*  HCT 26.7* 27.6* 24.9*  PLT 129* 126* 134*  MCV 89.0 89.6 86.8  MCH 28.6 28.6 28.2  MCHC 32.2 31.9* 32.5  RDW 20.2* 18.4* 18.0*  LYMPHSABS 0.4* 0.7*  --   MONOABS 0.6 0.7  --   EOSABS 0.2 0.2  --   BASOSABS 0.0 0.0  --     Chemistries   Recent Labs Lab 02/16/16 1400 02/21/16 1116 02/22/16 0325 02/23/16 0339  NA 133* 134* 134* 134*  K 3.8 4.6 4.1 3.6  CL  --   --  99* 97*  CO2 '27 28 27 28  '$ GLUCOSE 115 83 100* 123*  BUN 11.8 10.'2 13 18  '$ CREATININE 1.2 1.1 1.23 1.43*  CALCIUM 8.6 9.1 8.6* 8.5*  MG  --   --   --  1.9  AST 24 22  --   --   ALT 26 17  --   --   ALKPHOS 98 93  --   --  BILITOT 0.53 0.45  --   --    ------------------------------------------------------------------------------------------------------------------ No results for input(s): CHOL, HDL, LDLCALC, TRIG, CHOLHDL, LDLDIRECT in the last 72 hours.  No results found for: HGBA1C ------------------------------------------------------------------------------------------------------------------ No results for input(s): TSH, T4TOTAL, T3FREE, THYROIDAB in the last 72 hours.  Invalid input(s): FREET3 ------------------------------------------------------------------------------------------------------------------ No results for input(s): VITAMINB12, FOLATE, FERRITIN, TIBC, IRON, RETICCTPCT in the last 72 hours.  Coagulation profile No results for input(s): INR, PROTIME in the last 168 hours.  No results for input(s): DDIMER in the last 72 hours.  Cardiac Enzymes No results for input(s): CKMB, TROPONINI, MYOGLOBIN in the last 168 hours.  Invalid input(s):  CK ------------------------------------------------------------------------------------------------------------------    Component Value Date/Time   BNP 942.6* 02/21/2016 1552    Micro Results Recent Results (from the past 240 hour(s))  Body fluid culture     Status: None (Preliminary result)   Collection Time: 02/22/16 11:40 AM  Result Value Ref Range Status   Specimen Description PLEURAL RIGHT  Final   Special Requests NONE  Final   Gram Stain   Final    FEW WBC PRESENT, PREDOMINANTLY PMN NO ORGANISMS SEEN Performed at [facility]    Culture PENDING  Incomplete   Report Status PENDING  Incomplete    Radiology Reports Dg Chest 2 View  02/21/2016  CLINICAL DATA:  Stage IV lung malignancy status post chemotherapy and radiation; recent episode of pneumonia; increase shortness of breath and orthopnea. EXAM: CHEST  2 VIEW COMPARISON:  PA and lateral chest x-ray of February 16, 2016 FINDINGS: The right upper lobe density has become more confluent. The right lower lung is adequately inflated. There is a small right pleural effusion which is unchanged. The left lung is clear. There are post CABG changes. There is no cardiomegaly or pulmonary edema. The bony thorax exhibits no acute abnormality. IMPRESSION: Worsening of alveolar opacity in the right upper lobe which may reflect pneumonia either typical or postobstructive. Lymphangitic spread of malignancy could be present as well but is fell less likely than progressive pneumonia. There is no CHF. There is a small right pleural effusion. Electronically Signed   By: David  Martinique M.D.   On: 02/21/2016 11:02   Dg Chest 2 View  02/16/2016  CLINICAL DATA:  Shortness of breath with exertion, fever, cough EXAM: CHEST  2 VIEW COMPARISON:  Chest x-ray of 02/05/2016 FINDINGS: There is parenchymal opacity within the right upper lobe consistent with pneumonia. The left lung is clear. Mediastinal hilar contours are unremarkable. Followup PA and lateral  chest X-ray is recommended in 3-4 weeks following trial of antibiotic therapy to ensure resolution and exclude underlying malignancy. The heart is within normal limits in size. No bony abnormality is seen. Median sternotomy sutures are noted from CABG. IMPRESSION: Right upper lobe opacity most consistent with pneumonia. Followup PA and lateral chest X-ray is recommended in 3-4 weeks following trial of antibiotic therapy to ensure resolution and exclude underlying malignancy. Electronically Signed   By: Ivar Drape M.D.   On: 02/16/2016 15:19   Dg Chest 2 View  02/05/2016  CLINICAL DATA:  Acute onset of fever. Recently finished chemotherapy for right-sided lung cancer. Initial encounter. EXAM: CHEST  2 VIEW COMPARISON:  Chest radiograph performed 11/24/2015, and PET/CT performed 11/25/2015 FINDINGS: The lungs are well-aerated. New patchy right-sided airspace opacity raises concern for pneumonia. The known pulmonary mass is less well characterized on the current study. The left lung appears relatively clear. No pleural effusion or pneumothorax is seen. The heart is  normal in size. The patient is status post median sternotomy, with evidence of prior CABG. No acute osseous abnormalities are seen. Scattered clips are noted about the upper abdomen. IMPRESSION: New patchy right-sided airspace opacity raises concern for pneumonia. Known pulmonary mass is less well characterized on the current study. Electronically Signed   By: Garald Balding M.D.   On: 02/05/2016 21:52   Ct Chest W Contrast  02/21/2016  CLINICAL DATA:  75 year old with current history of squamous cell carcinoma of the right upper lobe, treated with radiation therapy and chemotherapy, completed earlier this month. Patient was admitted 02/05/2016 with community acquired pneumonia. He presents again today with cough and fever. EXAM: CT CHEST WITH CONTRAST TECHNIQUE: Multidetector CT imaging of the chest was performed during intravenous contrast  administration. CONTRAST:  52m ISOVUE-300 IOPAMIDOL (ISOVUE-300) INJECTION 61% IV. COMPARISON:  CT CT 11/25/2015.  CTA chest 11/11/2015, 03/10/2012. FINDINGS: Cardiovascular: Normal heart size. Extensive 3 vessel coronary atherosclerosis. Prior sternotomy for CABG. Visualized portions of the coronary grafts patent. No pericardial effusion moderate to severe atherosclerosis involving the thoracic and upper abdominal aorta without aneurysm. Atherosclerosis involving the proximal great vessels without visible stenosis. Mediastinum/Lymph Nodes: Interval marked decrease in size of the pathologic low right paratracheal mediastinal lymph node (station 4R) since the PET-CT, current measurement approximating 1.1 x 1.3 cm (previously 2.0 x 1.8 cm). The hypermetabolic right axillary lymph node on the PET-CT is unchanged to perhaps slightly decreased in size. No new or enlarging lymphadenopathy. Small hiatal hernia. Thyroid gland unremarkable. Lungs/Pleura: Since the PET-CT, marked reduction in size of the right upper lobe lung mass, now measuring approximately 2.2 x 1.9 cm (series 5, image 37), previously 4.3 x 3.4 cm. Interval development of confluent airspace opacities with air bronchograms in the right upper lobe and in the superior segment right lower lobe, associated with a large right pleural effusion. Patchy airspace opacities are present medially in the left upper lobe. Small left pleural effusion. Upper abdomen: Surgically absent gallbladder. The he visualized proximal portion of the abdominal aortic stent graft is unremarkable. No acute findings. 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 No evidence of osseous metastatic disease. IMPRESSION: 1. Confluent airspace pneumonia involving the right upper lobe and the superior segment right lower lobe, with patchy pneumonia involving the left upper lobe. The right upper lobe pneumonia is superimposed upon post radiation changes.  2. Large right pleural effusion and small left pleural effusion. 3. Interval marked reduction in size of the right upper lobe lung cancer since the prior PET-CT 11/25/2015. Measurements are given above. 4. Interval reduction in size of the pathologic station 4R mediastinal lymph node since the prior PET-CT. No new or enlarging lymphadenopathy. Electronically Signed   By: TEvangeline DakinM.D.   On: 02/21/2016 19:10   Dg Chest Port 1 View  02/23/2016  CLINICAL DATA:  Shortness of breath. EXAM: PORTABLE CHEST 1 VIEW COMPARISON:  02/22/2016. FINDINGS: Prior CABG. Heart size normal. Right upper lobe infiltrate and atelectasis again noted. No interim change. Continued follow-up exam suggested demonstrate clearing . No pleural effusion or pneumothorax. IMPRESSION: 1.  Prior CABG.  Heart size normal. 2. Right upper lobe infiltrate and atelectasis again noted without significant change. Continued follow-up exams to demonstrate clearing suggested. Electronically Signed   By: TMarcello Moores Register   On: 02/23/2016 06:57   Dg Chest Port 1 View  02/22/2016  CLINICAL DATA:  Pleural effusion, shortness of Breath EXAM: PORTABLE CHEST 1 VIEW COMPARISON:  Chest x-ray and  CT 02/21/2016 FINDINGS: Consolidation in the right upper lobe compatible with pneumonia previously seen effusion by CT not definitively visualized on this single view portable chest x-ray. No focal opacity on the left. Prior CABG. Heart is normal size. No acute bony abnormality. IMPRESSION: Continued consolidation in the right upper lobe most compatible with pneumonia. Previously seen pleural effusions cannot be visualized on this portable study. Electronically Signed   By: Rolm Baptise M.D.   On: 02/22/2016 11:53   Ct Angio Abd/pel W/ And/or W/o  01/25/2016  CLINICAL DATA:  Post stent graft EXAM: CTA ABDOMEN AND PELVIS wITHOUT AND WITH CONTRAST TECHNIQUE: Multidetector CT imaging of the abdomen and pelvis was performed using the standard protocol during bolus  administration of intravenous contrast. Multiplanar reconstructed images and MIPs were obtained and reviewed to evaluate the vascular anatomy. 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 There is no evidence of endoleak. Celiac is patent.  Branch vessels patent. 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 This has progressed since the prior study. 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  Branches reconstitute The left renal artery is patent. There continues to be atherosclerotic disease at the origin of the right renal artery. Significant stenosis cannot be excluded. The appearance is stable. Right common iliac artery landing zone is patent. Internal and external iliac arteries are patent Left common iliac artery landing zone is patent. The continues to be a small penetrating atherosclerotic ulcer just be on the landing zone. Left internal and external iliac arteries are patent. Postcholecystectomy Liver, spleen, pancreas, and adrenal glands are within normal limits. Small hypodensities in the kidneys are stable. Normal appendix. Sigmoid diverticulosis without acute diverticulitis. No focal obstructing lesion of the colon Left total hip arthroplasty. Mild enlargement of the prostate gland. Degenerative disc disease and facet arthropathy at L4-5. No vertebral compression deformity. There is no free-fluid.  No obvious retroperitoneal adenopathy. Review of the MIP images confirms the above findings. IMPRESSION: Aorto bi-iliac stent graft remains patent and there is no evidence of endoleak. 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 Electronically Signed   By: Marybelle Killings M.D.   On: 01/25/2016 16:38    Time Spent in minutes   30   Paul Sandoval K M.D on 02/23/2016 at 10:14 AM  Between 7am to 7pm - Pager - [phone]  After 7pm go to [email or web address] - password [facility]  [facility] -  Office  [phone]

## 2016-02-23 NOTE — Progress Notes (Signed)
[name] MRN: [id number] DOB: Jun 10, 1941    ADMISSION DATE:  02/21/2016 CONSULTATION DATE:  02/22/16  REFERRING MD :  Dr. Candiss Norse / [facility]   CHIEF COMPLAINT:  SOB, Pleural Effusion.    HISTORY OF PRESENT ILLNESS:  75 y/o M, former smoker, with PMH of CAD s/p CABG (1996), AAA s/p repair (2014), HTN, HLD, RBBB, ICM (LVEF 45% in 2015), GERD, diverticulosis, colon polyps, arthritis, COPD and Stage IIIA (T2b, N2, M0) RUL non-small cell lung cancer (diagnosed in 10/2015 after episode of hemoptysis) who presented to [facility] on 02/21/16 with reports on fever and cough.    The patient was diagnosed with Stage IIIA (T2b, N2, M0) RUL non-small cell lung cancer in March of 2017.  He has been treated with radiation and carboplatin/paclitaxel therapy by Dr. Earlie Server / Dr. Tammi Klippel.  Most recently, he was admitted 6/11-6/14 with fever, cough and pneumonia.  He was treated with levaquin inpatient and given 3 more days at discharge.  That hospitalization was also notable for hyponatremia and he was treated with salt tabs with improvement.  Due to hyponatremia, HCTZ and Lisinopril were held at discharge.  After discharge, he followed up 6/15 with his PCP and reported improvement.  On 6/22, he was called by the [facility] nurse navigator and reported not feeling well with persistent low grade fevers & cough.  An XRAY was completed which showed known RUL PNA.  He was seen again on 6/23 and given 5 days more of levaquin    He follow up again at the [facility] on 6/27 for an acute visit.  He reported difficulty lying flat.  Given concerns for outpatient failure of abx, he was sent for admission.   On admission, he reported ongoing intermittent fevers (tmax 100.1) and cough.  He denied sputum production, chest pain, pain with inspiration.  He also reported feeling like his heart had been racing and lower extremity swelling.  He reports he checks his blood pressure every day / monitors his pulse and it has been  elevated.  At his baseline, he has walked 2 miles per day since his heart surgery in 1996. CXR on admission showed RUL airspace disease.  Labs - Na 134, K 4.6, Cl 97, sr cr 1.1, BNP 942, PCT <0.10, WBC 7.1, Hgb 8.8, and platelets 126.  CT of the chest was assessed which showed confluent airspace pneumonia involving the RUL & superior segment RLL, with patchy PNA superimposed on radiation changes, large right pleural effusion, decreased size of RUL lung cancer and lymph nodes.    The patient was given one dose of lasix since admit.  An ECHO was assessed with results pending.  ABX were held.  PCCM consulted for evaluation of pleural effusion and possible PNA.     SUBJECTIVE:  Feeling better  VITAL SIGNS: Temp:  [97.6 F (36.4 C)-98.4 F (36.9 C)] 97.6 F (36.4 C) (06/29 0639) Pulse Rate:  [113-118] 114 (06/29 0639) Resp:  [18] 18 (06/29 0639) BP: (105-134)/(59-71) 130/66 mmHg (06/29 0639) SpO2:  [93 %-94 %] 93 % (06/29 0639)  PHYSICAL EXAMINATION: General:  Well appearing adult male in NAD, sitting up in chair , "I feel better" Neuro:  AAOx4, speech clear, MAE  HEENT:  MM pink/moist, good dentition, no JVD  Cardiovascular:  s1s2 rrr, no m/r/g Lungs:  Even/non-labored on RA, diminished RLL, otherwise clear, dullness to percussion RLL. Abdomen:  Soft, non-tender, bsx4 active  Musculoskeletal:  No acute deformities  Skin:  Warm/dry, BLE 1-2+  pitting edema, symmetrical    Recent Labs Lab 02/21/16 1116 02/22/16 0325 02/23/16 0339  NA 134* 134* 134*  K 4.6 4.1 3.6  CL  --  99* 97*  CO2 '28 27 28  '$ BUN 10.'2 13 18  '$ CREATININE 1.1 1.23 1.43*  GLUCOSE 83 100* 123*    Recent Labs Lab 02/16/16 1400 02/21/16 1116 02/22/16 0325  HGB 8.6* 8.8* 8.1*  HCT 26.7* 27.6* 24.9*  WBC 6.8 7.1 5.4  PLT 129* 126* 134*   Ct Chest W Contrast  02/21/2016  CLINICAL DATA:  75 year old with current history of squamous cell carcinoma of the right upper lobe, treated with radiation therapy and  chemotherapy, completed earlier this month. Patient was admitted 02/05/2016 with community acquired pneumonia. He presents again today with cough and fever. EXAM: CT CHEST WITH CONTRAST TECHNIQUE: Multidetector CT imaging of the chest was performed during intravenous contrast administration. CONTRAST:  34m ISOVUE-300 IOPAMIDOL (ISOVUE-300) INJECTION 61% IV. COMPARISON:  CT CT 11/25/2015.  CTA chest 11/11/2015, 03/10/2012. FINDINGS: Cardiovascular: Normal heart size. Extensive 3 vessel coronary atherosclerosis. Prior sternotomy for CABG. Visualized portions of the coronary grafts patent. No pericardial effusion moderate to severe atherosclerosis involving the thoracic and upper abdominal aorta without aneurysm. Atherosclerosis involving the proximal great vessels without visible stenosis. Mediastinum/Lymph Nodes: Interval marked decrease in size of the pathologic low right paratracheal mediastinal lymph node (station 4R) since the PET-CT, current measurement approximating 1.1 x 1.3 cm (previously 2.0 x 1.8 cm). The hypermetabolic right axillary lymph node on the PET-CT is unchanged to perhaps slightly decreased in size. No new or enlarging lymphadenopathy. Small hiatal hernia. Thyroid gland unremarkable. Lungs/Pleura: Since the PET-CT, marked reduction in size of the right upper lobe lung mass, now measuring approximately 2.2 x 1.9 cm (series 5, image 37), previously 4.3 x 3.4 cm. Interval development of confluent airspace opacities with air bronchograms in the right upper lobe and in the superior segment right lower lobe, associated with a large right pleural effusion. Patchy airspace opacities are present medially in the left upper lobe. Small left pleural effusion. Upper abdomen: Surgically absent gallbladder. The he visualized proximal portion of the abdominal aortic stent graft is unremarkable. No acute findings. 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 No evidence  of osseous metastatic disease. IMPRESSION: 1. Confluent airspace pneumonia involving the right upper lobe and the superior segment right lower lobe, with patchy pneumonia involving the left upper lobe. The right upper lobe pneumonia is superimposed upon post radiation changes. 2. Large right pleural effusion and small left pleural effusion. 3. Interval marked reduction in size of the right upper lobe lung cancer since the prior PET-CT 11/25/2015. Measurements are given above. 4. Interval reduction in size of the pathologic station 4R mediastinal lymph node since the prior PET-CT. No new or enlarging lymphadenopathy. Electronically Signed   By: TEvangeline DakinM.D.   On: 02/21/2016 19:10   Dg Chest Port 1 View  02/23/2016  CLINICAL DATA:  Shortness of breath. EXAM: PORTABLE CHEST 1 VIEW COMPARISON:  02/22/2016. FINDINGS: Prior CABG. Heart size normal. Right upper lobe infiltrate and atelectasis again noted. No interim change. Continued follow-up exam suggested demonstrate clearing . No pleural effusion or pneumothorax. IMPRESSION: 1.  Prior CABG.  Heart size normal. 2. Right upper lobe infiltrate and atelectasis again noted without significant change. Continued follow-up exams to demonstrate clearing suggested. Electronically Signed   By: TMarcello Moores Register   On: 02/23/2016 06:57   Dg Chest Port 1 V[address]  02/22/2016  CLINICAL DATA:  Pleural effusion, shortness of Breath EXAM: PORTABLE CHEST 1 VIEW COMPARISON:  Chest x-ray and CT 02/21/2016 FINDINGS: Consolidation in the right upper lobe compatible with pneumonia previously seen effusion by CT not definitively visualized on this single view portable chest x-ray. No focal opacity on the left. Prior CABG. Heart is normal size. No acute bony abnormality. IMPRESSION: Continued consolidation in the right upper lobe most compatible with pneumonia. Previously seen pleural effusions cannot be visualized on this portable study. Electronically Signed   By: Rolm Baptise M.D.    On: 02/22/2016 11:53    SIGNIFICANT EVENTS  6/27  Admit  6/28  PCCM consulted for pleural effusion evaluation   STUDIES:  CT Chest 6/27 >> confluent airspace pneumonia involving the RUL & superior segment RLL, with patchy PNA superimposed on radiation changes, large right pleural effusion, decreased size of RUL lung cancer and lymph nodes ECHO 6/28 >>   Intake/Output Summary (Last 24 hours) at 02/23/16 1059 Last data filed at 02/23/16 0949  Gross per 24 hour  Intake    780 ml  Output   1700 ml  Net   -920 ml   CULTURES / CYTOLOGY: Pleural Fluid 6/28  GS >>   Culture >>   LDH >> 95  Protein >> <3.0  Cytology >>     ASSESSMENT / PLAN:  1.  Right Pleural Effusion  2.  Stage IIIA (T2b, N2, M0) RUL non-small cell lung cancer  3.  Recent R PNA s/p Levaquin 4.  COPD 5.  Former Tobacco Abuse  6.  CAD s/p CABG 7.  NICM  8.  HTN, HLD  Discussion:  75 y/o M with a PMH of Stage IIIA RUL NSCLCA, COPD, CAD, NICM, HTN and recent admission for PNA & hyponatremia.  Treated with levaquin and lisinopril / HCTZ held at discharge.  Re-admitted 6/27 with cough, orthopnea, intermittent fevers, lower extremity swelling.  Found to have RUL airspace disease and pleural effusion.   PCT negative, WBC negative, BNP elevated.  DDx includes ongoing PNA with associated parapneumonic effusion, decompensated CHF in the setting of chemotherapy and HCTZ being held post discharge & malignant effusion.    Plan: Right thoracentesis 6/28 with 1150 cc clear yellow fluid not clearly exudative Follow pleural studies  ECHO findings 6/28 ef 35%, DIFFUSE HYPOKINESIS, grade 2dd, Strict I/O's  Consider repeating lasix in am 6/29 Monitor BMP with diuresis , negative 950 cc Trend PCT 6/28 <0.10 Assess sputum culture if pt able to produce HOLD abx for now, if fever, rise in WBC / PCT or exudative pleural fluid consider abx  6/29 he is better with diuresis, ? if lung changes are do to Rtx.  Therefore hold Abx, we  have asked Radiology to review for RTX pneumonitis abnd if this is the case he will steroid treatment  Richardson Landry Minor ACNP Maryanna Shape PCCM Pager [phone] till 3 pm If no answer page [phone] 02/23/2016, 10:50 AM   Attending note: I have seen and examined the patient with nurse practitioner/resident and agree with the note. History, labs and imaging reviewed.  75 Y/O with recent diagnosis of Stage IIIA Sq cell ca. S/p chemo, XRT therapy. He had an episode of PNA earlier this month that was treated with prolonged abx Now readmitted with reported fever, cough. WBC and Pct are normal and he is afebrile. BNP is elevated.  Images reviewed. There is consolidation with air bronchograms in RUL, rt effusion Thoracentesis reveals a borderline exudative effusion that  is not very impressive for a para pneumonic process. He looks well clincially and does not appear infected so we are holding off on antibiotics  Reccs: Will review CT images with Dr. Tammi Klippel. Consider pred for radiation pneumonitis. Abx in case he spikes or develops signs of infection.  Discussed with Dr. Candiss Norse.  Marshell Garfinkel MD [facility] Pager [phone] If no answer or after 3pm call: [phone] 02/23/2016, 12:55 PM

## 2016-02-23 NOTE — Consult Note (Signed)
CARDIOLOGY CONSULT NOTE   Patient ID: Paul Sandoval MRN: [id number] DOB/AGE: 03-24-1941 75 y.o.  Admit date: 02/21/2016  Primary Physician   [name], MD Primary Cardiologist   Dr. Johnsie Cancel Reason for Consultation   Low EF  Requesting Physician  Dr. Candiss Norse  HPI: Paul Sandoval is a 75 y.o. male with a history of CABG in 1996, carotid disease and aortic stent graft followed by Dr Kellie Simmering, HTN, HL, COPD, NSCLC Stage IIIA (T2b, N2, M0) who resented with fever and cough.   The patient was admitted to [facility] 03/24/14 after plan diagnostic left heart catheterization, revealing severe three-vessel coronary artery disease and total occlusion of the RCA, total occlusion of the LAD, severe ulcerative stenosis of the distal left main, and severe diffuse stenosis of the OM left circumflex branches. He continued patency of the saphenous vein graft to his PDA LIMA to LAD and patent but severely diseased saphenous vein graft to the diagonal. The patient underwent PCI of the distal left main with drug-eluting stent. A staged PCI of the saphenous vein graft to the diagonal 02/2014. Last seen by Dr. Johnsie Cancel 03/21/15. That time he was walking 2 miles every day and playing golf. No anginal pain.  He was diagnosed with a lung cancer in March 2017 and completed radiation + carboplatin/paclitaxel therapy x 4 in early June. He was admitted 02/05/16-02/08/16 for community-acquired pneumonia. He also had acute on chronic kidney disease and hyponatremia with sodium 125. His hydrochlorothiazide was stopped and given sodium tablet. He is an Enalapril 10 mg was discontinued at that time.  Since then he continued to have cough and intermittent low-grade fever. For the past week patient has noted progressive worsening of lower extremity edema. Noted to have orthopnea for the past 2 days prior to admission. No dyspnea or chest pain with exertion. No syncope or PND.   Was seen in [place] 02/21/16 and  complaining of increased shortness of breath, fatigue and cough with temperature of 100.1. 7 admitted directly to [facility] for further evaluation. Where workup revealed worsening pneumonia on RUL. CT showed pneumonia and R pleural effusion s/p t thoracocentesis with 1150 ml of clear yellow pleural fluid was obtained. ? pneumonic process. Held Abx and started on prednisone.   Due to concern for acute heart failure BNP was obtained which shows elevation. Echo done this admission shows reduction in his left ventricular function to 00-86%, grade 2 diastolic dysfunction, elevated ventricular end-diastolic filling pressure (EF of 45-50% 03/17/14). He was started on IV Lasix 40 mg daily with roughly 2 L of dialysis so far. He is able to sleep now. No chest pain walking in in a hallway without any dyspnea or chest pain.     Past Medical History  Diagnosis Date  . 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 AAA (abdominal aortic aneurysm) ([place]) 2010    4.4 cm 08/2008;4.44 in 7/10 and 4.65 in 08/2009; 4.8 by CT in 11/2009; 4.3 by ultrasound in 08/2010  . Hypertension   . Hyperlipidemia   . Tobacco abuse, in remission     40 pack year total consumption; discontinued in 1996  . GERD (gastroesophageal reflux disease)   . Right bundle branch block   . Diverticulosis   . Colonic polyp 2002    polypectomy in 2002  . COPD (chronic obstructive pulmonary disease) ([place])   . CAD (coronary artery disease)     03/18/14:  PCI with DES to distal  left main. 7/29: DES to the SVG to Diag  . ED (erectile dysfunction)   . IFG (impaired fasting glucose)   . Chronic rhinitis   . Myocardial infarction [facility])     "told h/o silent MI sometime before 1996"  . Pneumonia ~ 2001; ~ 2005  . Chronic bronchitis ([place])   . Arthritis     "fingers" (03/18/2014)  . Cardiomyopathy, ischemic     Echo 03/17/14: EF 45-50%  . Encounter for antineoplastic chemotherapy 12/19/2015  . Cancer [facility])     Upper  right lobe lung cancer     Past Surgical History  Procedure Laterality Date  . Colonoscopy  2002    polypectomy-patient denies  . Laparoscopic cholecystectomy  12/2009  . Abdominal aortic endovascular stent graft N/A 12/11/2012    Procedure: ABDOMINAL AORTIC ENDOVASCULAR STENT GRAFT;  Surgeon: Mal Misty, MD;  Location: [place];  Service: Vascular;  Laterality: N/A;  Ultrasound guided; [place]  . Abdominal aortic aneurysm repair  11/2012  . Total hip arthroplasty Left 01/21/2013    Procedure: TOTAL HIP ARTHROPLASTY ANTERIOR APPROACH;  Surgeon: Mauri Pole, MD;  Location: [place];  Service: Orthopedics;  Laterality: Left;  . Joint replacement    . Coronary artery bypass graft  01/09/1995    "CABG X3"  . Cardiac catheterization  01/08/1995  . Coronary angioplasty with stent placement  03/18/2014    "1"  . Coronary angioplasty with stent placement  03/24/2014    "1"  . Left and right heart catheterization with coronary/graft angiogram N/A 03/18/2014    Procedure: LEFT AND RIGHT HEART CATHETERIZATION WITH Beatrix Fetters;  Surgeon: Blane Ohara, MD;  Location: [facility] CATH LAB;  Service: Cardiovascular;  Laterality: N/A;  . Percutaneous coronary stent intervention (pci-s)  03/18/2014    Procedure: PERCUTANEOUS CORONARY STENT INTERVENTION (PCI-S);  Surgeon: Blane Ohara, MD;  Location: [facility] CATH LAB;  Service: Cardiovascular;;  . Percutaneous coronary stent intervention (pci-s) N/A 03/24/2014    Procedure: PERCUTANEOUS CORONARY STENT INTERVENTION (PCI-S);  Surgeon: Blane Ohara, MD;  Location: [facility] CATH LAB;  Service: Cardiovascular;  Laterality: N/A;  . Video bronchoscopy N/A 11/17/2015    Procedure: VIDEO BRONCHOSCOPY WITH FLUORO;  Surgeon: Rigoberto Noel, MD;  Location: [place];  Service: Cardiopulmonary;  Laterality: N/A;    Allergies  Allergen Reactions  . Neomycin Hives    I have reviewed the patient's current medications . aspirin EC  81 mg Oral Daily  . atorvastatin  40  mg Oral q1800  . clopidogrel  75 mg Oral Q breakfast  . enoxaparin (LOVENOX) injection  40 mg Subcutaneous Q24H  . ferrous sulfate  325 mg Oral Daily  . fluticasone furoate-vilanterol  1 puff Inhalation Daily  . furosemide  40 mg Oral Daily  . ketorolac  15 mg Intravenous Once  . metoprolol  50 mg Oral BID  . pantoprazole  40 mg Oral Daily  . predniSONE  60 mg Oral Q breakfast     acetaminophen **OR** acetaminophen, albuterol, docusate sodium  Prior to Admission medications   Medication Sig Start Date End Date Taking? Authorizing Provider  acetaminophen (TYLENOL) 500 MG tablet Take 500 mg by mouth every 6 (six) hours as needed for mild pain.   Yes Historical Provider, MD  aspirin EC 81 MG tablet Take 81 mg by mouth daily.   Yes Historical Provider, MD  clopidogrel (PLAVIX) 75 MG tablet Take 1 tablet (75 mg total) by mouth daily. Reported on 12/02/2015 01/26/16  Yes Collier Salina  Tommy Rainwater, MD  docusate sodium (COLACE) 100 MG capsule Take 100 mg by mouth daily as needed for mild constipation or moderate constipation.   Yes Historical Provider, MD  Ferrous Sulfate (IRON) 28 MG TABS Take 28 mg by mouth daily.   Yes Historical Provider, MD  fluticasone furoate-vilanterol (BREO ELLIPTA) 100-25 MCG/INH AEPB Inhale 1 puff into the lungs daily. 02/09/16  Yes Rigoberto Noel, MD  levofloxacin (LEVAQUIN) 750 MG tablet Take 1 tablet (750 mg total) by mouth daily. 02/16/16  Yes Susanne Borders, NP  metoprolol (LOPRESSOR) 50 MG tablet Take 50 mg by mouth 2 (two) times daily.    Yes Historical Provider, MD  Multiple Vitamins-Minerals (CENTRUM SILVER ADULT 50+) TABS Take 1 tablet by mouth daily.    Yes Historical Provider, MD  nitroGLYCERIN (NITROSTAT) 0.4 MG SL tablet Place 1 tablet (0.4 mg total) under the tongue every 5 (five) minutes as needed for chest pain. 03/23/15  Yes Josue Hector, MD  omeprazole (PRILOSEC) 20 MG capsule TAKE ONE CAPSULE BY MOUTH DAILY. Patient taking differently: TAKE 20 MG BY MOUTH DAILY.  01/09/16  Yes [name], MD  prochlorperazine (COMPAZINE) 10 MG tablet TAKE (1) TABLET BY MOUTH EVERY SIX HOURS AS NEEDED FOR NAUSEA AND VOMITING. 01/26/16  Yes Curt Bears, MD  simvastatin (ZOCOR) 80 MG tablet Take 0.5 tablets (40 mg total) by mouth at bedtime. 08/23/15  Yes [name], MD  VENTOLIN HFA 108 (90 Base) MCG/ACT inhaler INHALE 2 PUFFS BY MOUTH EVERY 4 TO 6 HOURS AS NEEDED FOR WHEEZING. 09/02/15  Yes [name], MD  sodium chloride 1 g tablet Take 2 tablets (2 g total) by mouth 2 (two) times daily with a meal. Patient not taking: Reported on 02/21/2016 02/08/16   Debbe Odea, MD  sucralfate (CARAFATE) 1 g tablet Dissolve in 8 oz water and drink 5 minutes prior to meals and at bedtime Patient not taking: Reported on 02/21/2016 01/05/16   Hayden Pedro, PA-C     Social History   Social History  . Marital Status: Married    Spouse Name: N/A  . Number of Children: 1  . Years of Education: N/A   Occupational History  . Retired     [employer]   Social History Main Topics  . Smoking status: Former Smoker -- 1.50 packs/day for 30 years    Types: Cigarettes    Start date: 12/01/1956    Quit date: 01/08/1995  . Smokeless tobacco: Never Used  . Alcohol Use: 0.0 oz/week    0 Standard drinks or equivalent per week     Comment: 03/18/2014 "no alacohol since 1996"  . Drug Use: No  . Sexual Activity: No   Other Topics Concern  . Not on file   Social History Narrative    Family Status  Relation Status Death Age  . Father Deceased 63    lung neoplasm; history of coronary artery disease  . Mother Deceased 64    unknown cause; history of Parkinson's  . Sister Alive     x3  . Brother Alive     hypertension and rheumatoid arthritis   Family History  Problem Relation Age of Onset  . Colon cancer Neg Hx   . Colon polyps Neg Hx   . Heart disease Father   . Arthritis Mother   . Parkinsonism Mother   . Arthritis Sister     Brother with  rheumatoid arthritis  . Cancer Father  Lung  . Hypertension Brother       ROS:  Full 14 point review of systems complete and found to be negative unless listed above.  Physical Exam: Blood pressure 130/66, pulse 114, temperature 97.6 F (36.4 C), temperature source Oral, resp. rate 18, height '5\' 10"'$  (1.778 m), weight 176 lb 12.8 oz (80.196 kg), SpO2 95 %.  General: Well developed, well nourished, male in no acute distress Head: Eyes PERRLA, No xanthomas. Normocephalic and atraumatic, oropharynx without edema or exudate.  Lungs: Resp regular and unlabored, CTA. Heart: RRR no s3, s4, or murmurs..   Neck: No carotid bruits. No lymphadenopathy. No  JVD. Abdomen: Bowel sounds present, abdomen soft and non-tender without masses or hernias noted. Msk:  No spine or cva tenderness. No weakness, no joint deformities or effusions. Extremities: No clubbing, cyanosis. 2+ BL LE  edema. DP/PT/Radials 2+ and equal bilaterally. Neuro: Alert and oriented X 3. No focal deficits noted. Psych:  Good affect, responds appropriately Skin: No rashes or lesions noted.  Labs:   Lab Results  Component Value Date   WBC 5.4 02/22/2016   HGB 8.1* 02/22/2016   HCT 24.9* 02/22/2016   MCV 86.8 02/22/2016   PLT 134* 02/22/2016   No results for input(s): INR in the last 72 hours.  Recent Labs Lab 02/21/16 1116  02/22/16 1147 02/22/16 1601 02/23/16 0339  NA 134*  < >  --   --  134*  K 4.6  < >  --   --  3.6  CL  --   < >  --   --  97*  CO2 28  < >  --   --  28  BUN 10.2  < >  --   --  18  CREATININE 1.1  < >  --   --  1.43*  CALCIUM 9.1  < >  --   --  8.5*  PROT 6.7  --  7.2  --   --   BILITOT 0.45  --   --   --   --   ALKPHOS 93  --   --   --   --   ALT 17  --   --   --   --   AST 22  --   --   --   --   GLUCOSE 83  < >  --   --  123*  ALBUMIN 2.3*  < >  --  2.8*  --   < > = values in this interval not displayed. MAGNESIUM  Date Value Ref Range Status  02/23/2016 1.9 1.7 - 2.4 mg/dL Final    No results for input(s): CKTOTAL, CKMB, TROPONINI in the last 72 hours. No results for input(s): TROPIPOC in the last 72 hours. No results found for: PROBNP Lab Results  Component Value Date   CHOL 148 08/23/2015   HDL 37* 08/23/2015   LDLCALC 83 08/23/2015   TRIG 139 08/23/2015   No results found for: DDIMER LIPASE  Date/Time Value Ref Range Status  12/21/2009 03:52 PM 72* 11 - 59 U/L Final   No results found for: TSH, T4TOTAL, T3FREE, THYROIDAB No results found for: VITAMINB12, FOLATE, FERRITIN, TIBC, IRON, RETICCTPCT  Echo: 02/22/16 LV EF: 30% - 35%  ------------------------------------------------------------------- Indications: CHF - 428.0.  ------------------------------------------------------------------- History: PMH: Coronary artery disease. Chronic obstructive pulmonary disease. PMH: Myocardial infarction. Risk factors: Former tobacco use.  ------------------------------------------------------------------- Study Conclusions  - Left ventricle: There is diffuse hypokinesis and akinesis of the  mid anteroseptal, aterior and anterolateral walls and apical  anterior and septal walls. The cavity size was normal. There was  mild concentric hypertrophy. Systolic function was moderately to  severely reduced. The estimated ejection fraction was in the  range of 30% to 35%. Features are consistent with a pseudonormal  left ventricular filling pattern, with concomitant abnormal  relaxation and increased filling pressure (grade 2 diastolic  dysfunction). Doppler parameters are consistent with elevated  ventricular end-diastolic filling pressure. - Aortic valve: Trileaflet; normal thickness leaflets. There was no  regurgitation. - Aortic root: The aortic root was normal in size. - Mitral valve: Structurally normal valve. There was mild  regurgitation. - Left atrium: The atrium was mildly dilated. - Right ventricle: The cavity size was  normal. Wall thickness was  normal. Systolic function was normal. - Right atrium: The atrium was normal in size. - Tricuspid valve: There was mild regurgitation. - Pulmonic valve: There was trivial regurgitation. - Pulmonary arteries: Systolic pressure was within the normal  range. - Inferior vena cava: The vessel was normal in size. - Pericardium, extracardiac: There was no pericardial effusion.   ECG:  Sinus rhythm, RBBB  02/05/16 Vent. rate 97 BPM PR interval 166 ms QRS duration 152 ms QT/QTc 376/478 ms P-R-T axes 65 90 -10  Radiology:  Ct Chest W Contrast  02/21/2016  CLINICAL DATA:  75 year old with current history of squamous cell carcinoma of the right upper lobe, treated with radiation therapy and chemotherapy, completed earlier this month. Patient was admitted 02/05/2016 with community acquired pneumonia. He presents again today with cough and fever. EXAM: CT CHEST WITH CONTRAST TECHNIQUE: Multidetector CT imaging of the chest was performed during intravenous contrast administration. CONTRAST:  70m ISOVUE-300 IOPAMIDOL (ISOVUE-300) INJECTION 61% IV. COMPARISON:  CT CT 11/25/2015.  CTA chest 11/11/2015, 03/10/2012. FINDINGS: Cardiovascular: Normal heart size. Extensive 3 vessel coronary atherosclerosis. Prior sternotomy for CABG. Visualized portions of the coronary grafts patent. No pericardial effusion moderate to severe atherosclerosis involving the thoracic and upper abdominal aorta without aneurysm. Atherosclerosis involving the proximal great vessels without visible stenosis. Mediastinum/Lymph Nodes: Interval marked decrease in size of the pathologic low right paratracheal mediastinal lymph node (station 4R) since the PET-CT, current measurement approximating 1.1 x 1.3 cm (previously 2.0 x 1.8 cm). The hypermetabolic right axillary lymph node on the PET-CT is unchanged to perhaps slightly decreased in size. No new or enlarging lymphadenopathy. Small hiatal hernia. Thyroid gland  unremarkable. Lungs/Pleura: Since the PET-CT, marked reduction in size of the right upper lobe lung mass, now measuring approximately 2.2 x 1.9 cm (series 5, image 37), previously 4.3 x 3.4 cm. Interval development of confluent airspace opacities with air bronchograms in the right upper lobe and in the superior segment right lower lobe, associated with a large right pleural effusion. Patchy airspace opacities are present medially in the left upper lobe. Small left pleural effusion. Upper abdomen: Surgically absent gallbladder. The he visualized proximal portion of the abdominal aortic stent graft is unremarkable. No acute findings. 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 No evidence of osseous metastatic disease. IMPRESSION: 1. Confluent airspace pneumonia involving the right upper lobe and the superior segment right lower lobe, with patchy pneumonia involving the left upper lobe. The right upper lobe pneumonia is superimposed upon post radiation changes. 2. Large right pleural effusion and small left pleural effusion. 3. Interval marked reduction in size of the right upper lobe lung cancer since the prior PET-CT 11/25/2015. Measurements are given above. 4.  Interval reduction in size of the pathologic station 4R mediastinal lymph node since the prior PET-CT. No new or enlarging lymphadenopathy. Electronically Signed   By: Evangeline Dakin M.D.   On: 02/21/2016 19:10   Dg Chest Port 1 View  02/23/2016  CLINICAL DATA:  Shortness of breath. EXAM: PORTABLE CHEST 1 VIEW COMPARISON:  02/22/2016. FINDINGS: Prior CABG. Heart size normal. Right upper lobe infiltrate and atelectasis again noted. No interim change. Continued follow-up exam suggested demonstrate clearing . No pleural effusion or pneumothorax. IMPRESSION: 1.  Prior CABG.  Heart size normal. 2. Right upper lobe infiltrate and atelectasis again noted without significant change. Continued follow-up exams to  demonstrate clearing suggested. Electronically Signed   By: Marcello Moores  Register   On: 02/23/2016 06:57   Dg Chest Port 1 View  02/22/2016  CLINICAL DATA:  Pleural effusion, shortness of Breath EXAM: PORTABLE CHEST 1 VIEW COMPARISON:  Chest x-ray and CT 02/21/2016 FINDINGS: Consolidation in the right upper lobe compatible with pneumonia previously seen effusion by CT not definitively visualized on this single view portable chest x-ray. No focal opacity on the left. Prior CABG. Heart is normal size. No acute bony abnormality. IMPRESSION: Continued consolidation in the right upper lobe most compatible with pneumonia. Previously seen pleural effusions cannot be visualized on this portable study. Electronically Signed   By: Rolm Baptise M.D.   On: 02/22/2016 11:53    ASSESSMENT AND PLAN:     1. Acute on chronic combined systolic and diastolic heart failure. - Right related to recent administration of salt tablet. His HCTZ and his Enalapril 10 is continued during last admission. - Echo done this admission shows reduction in his left ventricular function to 25-49%, grade 2 diastolic dysfunction, elevated ventricular end-diastolic filling pressure (EF of 45-50% 03/17/14). There is no symptoms of ischemia related declined EF. We'll review with M.D. Likely outpatient Myoview. - Continue IV diuresis. Net I &O negative 1.7 L. 9Lb weight loss.    2. CAD s/p CABG 1995 - Distal left main with drug-eluting stent. A staged PCI of the saphenous vein graft to the diagonal 02/2014. - No anginal pain.      Fever, unspecified   Chronic obstructive airway disease with asthma (HCC)   CKD (chronic kidney disease) stage 3, GFR 30-59 ml/min   Anemia due to antineoplastic chemotherapy   Fever   Signed: Loribeth Katich, PA 02/23/2016, 1:42 PM Pager [phone]  Co-Sign MD

## 2016-02-23 NOTE — Care Management Note (Signed)
Case Management Note  Patient Details  [name] MRN: [id number] Date of Birth: 02-13-1941  Subjective/Objective:     75 yo admitted with Fever. Hx of Lung CA.              Action/Plan: From home with spouse. Chart reviewed and CM following for DC needs.  Expected Discharge Date:   (UNKNOWN)               Expected Discharge Plan:  Home/Self Care  In-House Referral:     Discharge planning Services  CM Consult  Post Acute Care Choice:    Choice offered to:     DME Arranged:    DME Agency:     HH Arranged:    HH Agency:     Status of Service:  In process, will continue to follow  If discussed at Long Length of Stay Meetings, dates discussed:    Additional CommentsLynnell Catalan, RN 02/23/2016, 11:25 AM  [phone]

## 2016-02-23 NOTE — Progress Notes (Signed)
  Radiation Oncology         [phone] ________________________________  [name] MRN: [id number]  Date: 02/21/2016  DOB: 06/20/1941  Chart Note:  I reviewed this patient's most recent findings and wanted to take a minute to document my impression.  In light of airspace disease in right upper lung without evidence of an infectious process, I would empirically treat for some degree of post-radiation effect.  Recommend Prednisone starting dose of 60 mg daily with 30 day taper.  Shorter tapers could exacerbate radiation effects.  ------------------------------------------------   Tyler Pita, MD [place] Director and Director of Stereotactic Radiosurgery Direct Dial: [phone]  Fax: [phone] [email or web address]  Skype  LinkedIn

## 2016-02-24 ENCOUNTER — Observation Stay (HOSPITAL_COMMUNITY): Payer: Medicare Other

## 2016-02-24 ENCOUNTER — Telehealth: Payer: Self-pay | Admitting: Cardiovascular Disease

## 2016-02-24 ENCOUNTER — Encounter (HOSPITAL_COMMUNITY): Payer: Self-pay | Admitting: Radiology

## 2016-02-24 DIAGNOSIS — K219 Gastro-esophageal reflux disease without esophagitis: Secondary | ICD-10-CM | POA: Diagnosis not present

## 2016-02-24 DIAGNOSIS — Z923 Personal history of irradiation: Secondary | ICD-10-CM | POA: Diagnosis not present

## 2016-02-24 DIAGNOSIS — I2510 Atherosclerotic heart disease of native coronary artery without angina pectoris: Secondary | ICD-10-CM | POA: Diagnosis not present

## 2016-02-24 DIAGNOSIS — I714 Abdominal aortic aneurysm, without rupture: Secondary | ICD-10-CM | POA: Diagnosis not present

## 2016-02-24 DIAGNOSIS — I252 Old myocardial infarction: Secondary | ICD-10-CM | POA: Diagnosis not present

## 2016-02-24 DIAGNOSIS — J189 Pneumonia, unspecified organism: Secondary | ICD-10-CM | POA: Diagnosis not present

## 2016-02-24 DIAGNOSIS — I130 Hypertensive heart and chronic kidney disease with heart failure and stage 1 through stage 4 chronic kidney disease, or unspecified chronic kidney disease: Secondary | ICD-10-CM | POA: Diagnosis not present

## 2016-02-24 DIAGNOSIS — I739 Peripheral vascular disease, unspecified: Secondary | ICD-10-CM | POA: Diagnosis not present

## 2016-02-24 DIAGNOSIS — R509 Fever, unspecified: Secondary | ICD-10-CM | POA: Diagnosis not present

## 2016-02-24 DIAGNOSIS — J449 Chronic obstructive pulmonary disease, unspecified: Secondary | ICD-10-CM | POA: Diagnosis not present

## 2016-02-24 DIAGNOSIS — R000 Tachycardia, unspecified: Secondary | ICD-10-CM

## 2016-02-24 DIAGNOSIS — R0602 Shortness of breath: Secondary | ICD-10-CM | POA: Diagnosis not present

## 2016-02-24 DIAGNOSIS — N183 Chronic kidney disease, stage 3 (moderate): Secondary | ICD-10-CM | POA: Diagnosis not present

## 2016-02-24 DIAGNOSIS — R0601 Orthopnea: Secondary | ICD-10-CM | POA: Diagnosis not present

## 2016-02-24 DIAGNOSIS — Z8261 Family history of arthritis: Secondary | ICD-10-CM | POA: Diagnosis not present

## 2016-02-24 DIAGNOSIS — K5730 Diverticulosis of large intestine without perforation or abscess without bleeding: Secondary | ICD-10-CM | POA: Diagnosis not present

## 2016-02-24 DIAGNOSIS — D6959 Other secondary thrombocytopenia: Secondary | ICD-10-CM | POA: Diagnosis not present

## 2016-02-24 DIAGNOSIS — C3411 Malignant neoplasm of upper lobe, right bronchus or lung: Secondary | ICD-10-CM | POA: Diagnosis not present

## 2016-02-24 DIAGNOSIS — D6481 Anemia due to antineoplastic chemotherapy: Secondary | ICD-10-CM | POA: Diagnosis not present

## 2016-02-24 DIAGNOSIS — E785 Hyperlipidemia, unspecified: Secondary | ICD-10-CM | POA: Diagnosis not present

## 2016-02-24 DIAGNOSIS — K449 Diaphragmatic hernia without obstruction or gangrene: Secondary | ICD-10-CM | POA: Diagnosis not present

## 2016-02-24 DIAGNOSIS — I5043 Acute on chronic combined systolic (congestive) and diastolic (congestive) heart failure: Secondary | ICD-10-CM | POA: Diagnosis not present

## 2016-02-24 DIAGNOSIS — I4510 Unspecified right bundle-branch block: Secondary | ICD-10-CM | POA: Diagnosis not present

## 2016-02-24 DIAGNOSIS — I255 Ischemic cardiomyopathy: Secondary | ICD-10-CM | POA: Diagnosis not present

## 2016-02-24 DIAGNOSIS — J90 Pleural effusion, not elsewhere classified: Secondary | ICD-10-CM | POA: Diagnosis not present

## 2016-02-24 DIAGNOSIS — I2583 Coronary atherosclerosis due to lipid rich plaque: Secondary | ICD-10-CM | POA: Diagnosis not present

## 2016-02-24 LAB — BASIC METABOLIC PANEL
ANION GAP: 8 (ref 5–15)
BUN: 22 mg/dL — ABNORMAL HIGH (ref 6–20)
CHLORIDE: 99 mmol/L — AB (ref 101–111)
CO2: 27 mmol/L (ref 22–32)
Calcium: 8.4 mg/dL — ABNORMAL LOW (ref 8.9–10.3)
Creatinine, Ser: 1.36 mg/dL — ABNORMAL HIGH (ref 0.61–1.24)
GFR, EST AFRICAN AMERICAN: 57 mL/min — AB (ref >60)
GFR, EST NON AFRICAN AMERICAN: 49 mL/min — AB (ref >60)
Glucose, Bld: 136 mg/dL — ABNORMAL HIGH (ref 65–99)
POTASSIUM: 4.2 mmol/L (ref 3.5–5.1)
SODIUM: 134 mmol/L — AB (ref 135–145)

## 2016-02-24 LAB — TSH: TSH: 0.918 u[IU]/mL (ref 0.350–4.500)

## 2016-02-24 MED ORDER — METOPROLOL TARTRATE 5 MG/5ML IV SOLN
5.0000 mg | Freq: Once | INTRAVENOUS | Status: AC
  Administered 2016-02-24: 5 mg via INTRAVENOUS
  Filled 2016-02-24: qty 5

## 2016-02-24 MED ORDER — IOPAMIDOL (ISOVUE-370) INJECTION 76%
100.0000 mL | Freq: Once | INTRAVENOUS | Status: AC | PRN
  Administered 2016-02-24: 100 mL via INTRAVENOUS

## 2016-02-24 MED ORDER — LIVING BETTER WITH HEART FAILURE BOOK
Freq: Once | Status: AC
  Administered 2016-02-24: 19:00:00

## 2016-02-24 MED ORDER — METOPROLOL TARTRATE 25 MG PO TABS
75.0000 mg | ORAL_TABLET | Freq: Two times a day (BID) | ORAL | Status: DC
  Administered 2016-02-24 – 2016-02-25 (×2): 75 mg via ORAL
  Filled 2016-02-24 (×2): qty 1

## 2016-02-24 NOTE — Progress Notes (Signed)
[name] MRN: [id number] DOB: 1940-09-01    ADMISSION DATE:  02/21/2016 CONSULTATION DATE:  02/22/16  REFERRING MD :  Dr. Candiss Norse / [facility]   CHIEF COMPLAINT:  SOB, Pleural Effusion.    HISTORY OF PRESENT ILLNESS:  75 y/o M, former smoker, with PMH of CAD s/p CABG (1996), AAA s/p repair (2014), HTN, HLD, RBBB, ICM (LVEF 45% in 2015), GERD, diverticulosis, colon polyps, arthritis, COPD and Stage IIIA (T2b, N2, M0) RUL non-small cell lung cancer (diagnosed in 10/2015 after episode of hemoptysis) who presented to [facility] on 02/21/16 with reports on fever and cough.    The patient was diagnosed with Stage IIIA (T2b, N2, M0) RUL non-small cell lung cancer in March of 2017.  He has been treated with radiation and carboplatin/paclitaxel therapy by Dr. Earlie Server / Dr. Tammi Klippel.  Most recently, he was admitted 6/11-6/14 with fever, cough and pneumonia.  He was treated with levaquin inpatient and given 3 more days at discharge.  That hospitalization was also notable for hyponatremia and he was treated with salt tabs with improvement.  Due to hyponatremia, HCTZ and Lisinopril were held at discharge.  After discharge, he followed up 6/15 with his PCP and reported improvement.  On 6/22, he was called by the [facility] nurse navigator and reported not feeling well with persistent low grade fevers & cough.  An XRAY was completed which showed known RUL PNA.  He was seen again on 6/23 and given 5 days more of levaquin    He follow up again at the [facility] on 6/27 for an acute visit.  He reported difficulty lying flat.  Given concerns for outpatient failure of abx, he was sent for admission.   On admission, he reported ongoing intermittent fevers (tmax 100.1) and cough.  He denied sputum production, chest pain, pain with inspiration.  He also reported feeling like his heart had been racing and lower extremity swelling.  He reports he checks his blood pressure every day / monitors his pulse and it has been  elevated.  At his baseline, he has walked 2 miles per day since his heart surgery in 1996. CXR on admission showed RUL airspace disease.  Labs - Na 134, K 4.6, Cl 97, sr cr 1.1, BNP 942, PCT <0.10, WBC 7.1, Hgb 8.8, and platelets 126.  CT of the chest was assessed which showed confluent airspace pneumonia involving the RUL & superior segment RLL, with patchy PNA superimposed on radiation changes, large right pleural effusion, decreased size of RUL lung cancer and lymph nodes.    The patient was given one dose of lasix since admit.  An ECHO was assessed with results pending.  ABX were held.  PCCM consulted for evaluation of pleural effusion and possible PNA.     SUBJECTIVE:  No issues, started on prednisone  VITAL SIGNS: Temp:  [97.6 F (36.4 C)-98 F (36.7 C)] 97.7 F (36.5 C) (06/30 1320) Pulse Rate:  [93-120] 97 (06/30 1320) Resp:  [16-20] 20 (06/30 1320) BP: (116-161)/(69-93) 129/78 mmHg (06/30 1320) SpO2:  [94 %-99 %] 96 % (06/30 1320) Weight:  [178 lb (80.74 kg)] 178 lb (80.74 kg) (06/30 1320)  PHYSICAL EXAMINATION: General:  No distress, Awake, alert, oriented  Neuro:  No distress HEENT:  Moist mucus membranes Cardiovascular:  s1s2 rrr, no MRG Lungs: Diminished BS on right side Abdomen:  Soft, non-tender, bsx4 active  Musculoskeletal:  No acute deformities  Skin:  Trace edema   Recent Labs Lab 02/22/16 0325 02/23/16  7035 02/24/16 0318  NA 134* 134* 134*  K 4.1 3.6 4.2  CL 99* 97* 99*  CO2 '27 28 27  '$ BUN 13 18 22*  CREATININE 1.23 1.43* 1.36*  GLUCOSE 100* 123* 136*    Recent Labs Lab 02/21/16 1116 02/22/16 0325  HGB 8.8* 8.1*  HCT 27.6* 24.9*  WBC 7.1 5.4  PLT 126* 134*   Ct Angio Chest Pe W Or Wo Contrast  02/24/2016  CLINICAL DATA:  75 year old male with tachycardia EXAM: CT ANGIOGRAPHY CHEST WITH CONTRAST TECHNIQUE: Multidetector CT imaging of the chest was performed using the standard protocol during bolus administration of intravenous contrast.  Multiplanar CT image reconstructions and MIPs were obtained to evaluate the vascular anatomy. CONTRAST:  100 mL Isovue 370 COMPARISON:  Chest x-ray 02/23/2016; recent CT scan of the chest 02/21/2016; PET-CT 11/25/2015 FINDINGS: Mediastinum: No mediastinal mass or suspicious adenopathy. Small hiatal hernia. Unremarkable visualized thyroid gland. Heart/Vascular: Adequate opacification of the pulmonary arteries to the proximal subsegmental level. No evidence of pulmonary embolus. Patient is status post coronary artery bypass grafting with a patent LIMA to distal LAD graft, and patent aortic to posterior descending coronary artery. A metallic stent is present in the left main coronary artery. The heart is normal in size. No pericardial effusion. No aortic aneurysm. Conventional 3 vessel arch anatomy. Lungs/Pleura: Persistent but improving right layering pleural effusion. Trace left effusion is also slightly smaller. Similar appearance of architectural distortion and diffuse predominantly interstitial opacities throughout the right upper lobe and superior segment of the right lower lobe. Findings remain most consistent with changes of prior external beam radiation therapy. Similar changes are present in the medial aspect of the lingula. No new focal airspace consolidation, or mass. The soft tissue nodule in the medial aspect of the right upper lobe remains essentially unchanged at 2.0 x 1.8 cm. Bones/Soft Tissues: No acute fracture or aggressive appearing lytic or blastic osseous lesion. Upper Abdomen: Surgical changes of prior cholecystectomy. Otherwise, no acute findings in the upper abdomen. Review of the MIP images confirms the above findings. IMPRESSION: 1. Negative for acute pulmonary embolus, pneumonia or other acute cardiopulmonary process. 2. Similar appearance of post radiation changes in the right upper lobe, superior segment of the right lower lobe and medial aspect of the left upper lobe. 3. Decreasing  (compared to 02/21/2016) bilateral pleural effusions now small on the right and trace on the left. 4. Stable right upper lobe pulmonary nodule status post treatment with radiation therapy. Electronically Signed   By: Jacqulynn Cadet M.D.   On: 02/24/2016 14:46   Dg Chest Port 1 View  02/23/2016  CLINICAL DATA:  Shortness of breath. EXAM: PORTABLE CHEST 1 VIEW COMPARISON:  02/22/2016. FINDINGS: Prior CABG. Heart size normal. Right upper lobe infiltrate and atelectasis again noted. No interim change. Continued follow-up exam suggested demonstrate clearing . No pleural effusion or pneumothorax. IMPRESSION: 1.  Prior CABG.  Heart size normal. 2. Right upper lobe infiltrate and atelectasis again noted without significant change. Continued follow-up exams to demonstrate clearing suggested. Electronically Signed   By: Marcello Moores  Register   On: 02/23/2016 06:57    SIGNIFICANT EVENTS  6/27  Admit  6/28  PCCM consulted for pleural effusion evaluation   STUDIES:  CT Chest 6/27 >> confluent airspace pneumonia involving the RUL & superior segment RLL, with patchy PNA superimposed on radiation changes, large right pleural effusion, decreased size of RUL lung cancer and lymph nodes ECHO 6/28 >>   Intake/Output Summary (Last 24 hours) at  02/24/16 1537 Last data filed at 02/24/16 1200  Gross per 24 hour  Intake   1220 ml  Output   2426 ml  Net  -1206 ml   CULTURES / CYTOLOGY: Pleural Fluid 6/28  GS >>   Culture >>   LDH >> 95  Protein >> <3.0  Cytology >> reactive mesothelial cells     ASSESSMENT / PLAN: 1.  Right Pleural Effusion  2.  Stage IIIA (T2b, N2, M0) RUL non-small cell lung cancer  3.  Recent R PNA s/p Levaquin 4.  COPD 5.  Former Tobacco Abuse  6.  CAD s/p CABG 7.  NICM  8.  HTN, HLD  Discussion:   75 Y/O with recent diagnosis of Stage IIIA Sq cell ca. S/p chemo, XRT therapy. He had an episode of PNA earlier this month that was treated with prolonged abx Now readmitted with  reported fever, cough. WBC and Pct are normal and he is afebrile. BNP is elevated.  CT shows consolidation with air bronchograms in RUL, rt effusion Thoracentesis reveals a borderline exudative effusion that is not very impressive for a para pneumonic process. 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 Please call with any questions.    Marshell Garfinkel MD [facility] Pager [phone] If no answer or after 3pm call: [phone] 02/24/2016, 3:37 PM

## 2016-02-24 NOTE — Progress Notes (Signed)
PROGRESS NOTE                                                                                                                                                                                                             Patient Demographics:    Paul Sandoval, is a 75 y.o. male, DOB - 1941-08-16, JSE:[id number]  Admit date - 02/21/2016   Admitting Physician Edwin Dada, MD  Outpatient Primary MD for the patient is [name], MD  LOS - 3  No chief complaint on file.      Brief Narrative    Paul Sandoval is a 75 y.o. male with a past medical history significant for NSCLC Stage IIIA (T2b, N2, M0), COPD, peripheral vascular disease, and CAD s/p CABG and decreased LV function EF 45% in 2015 who presents with fever and cough.  The patient was diagnosed with lung cancer in March and completed radiation + carboplatin/paclitaxel therapy in early June. He was admitted here 6/11/-6/14 with fever and cough, diagnosed with CAP. That hospitalization was complicated by hyponatremia which was treated with salt tabs. After discharge he came to the [place] same-day clinic on 6/22 with persistent low-grade fevers (100.55F) and non-productive cough and so his antibiotics were continued 5 days.   Since then, he has had continued cough and intermittent low grade fevers. He has some mild ankle swelling and orthopnea at night that resolves with sitting up in a recliner. He has been walking laps in his house 4x per day, and not felt SOB with exertion. He is not producing sputum, has no chest pain, pleuritic pain. His albuterol is not helping.   Today he had his follow up at [facility] and was complaining of above. Was afebrile but tachycardic. CXR was repeated that showed worsening RUL opacity, and so [facility] was asked to accept as direct admit from [facility] for pneumonia failed outpatient therapy. Plan was discussed  with Dr. Irene Limbo on-call to go ahead and order staging CT planned for next week.    Subjective:    Paul Sandoval today has, No headache, No chest pain, No abdominal pain - No Nausea, No new weakness tingling or numbness, No Cough - Improved orthopnea and shortness of breath.   Assessment  & Plan :     1.Shortness of breath which actually is orthopnea and  PND with elevated BNP. Patient has history of lung cancer with CT evidence of right-sided consolidation and bilateral pleural effusion. Symptoms most likely CHF related.  Although CT scan appears to be consistent with pneumonia patient is surprisingly nontoxic appearing, pro-calcitonin is stable, no fevers or leukocytosis, no cough. His BNP is elevated and he has trace edema on exam. His symptoms are more consistent with acute on chronic systolic CHF, Better after IV Lasix, echo shows worsened EF since his last echo 2 years ago. He is currently sitting in chair symptom free without any oxygen demand. He is better after thoracentesis and diuresis with Lasix.  Currently I do not think he has pneumonia, thoracentesis shows borderline exudative/transudative fluid as well, pulmonary following. He he does remain at risk of developing pneumonia due to his malignancy with possibility of postobstructive infection. Of note he had IV antibiotics and long course of oral antibiotics recently.   2. Underlying COPD. At baseline no wheezing. Supportive care.  3. History of CAD with essential hypertension. No chest pain. Likely has underlying chronic systolic CHF last EF 94% down from 45-50% 2 years ago. Plan is to continue aspirin, Plavix, beta blocker and statin. Symptoms improved with Lasix which will be continued, cardiology consulted. We will monitor closely. He will Likely require outpatient Lexiscan.  4. 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 No need for packed RBC transfusion or Platelet replacement at this time.  5. CK  D stage III. Baseline creatinine close to 1.4 he is at baseline monitor with diuresis.  6. GERD. On PPI.  7. Dyslipidemia. On statin.  8. Right Lung NSCLC Stage IIIA (T2b, N2, M0) - undergoing chemotherapy, for now plan as in #1 above thereafter follow up with Dr. Julien Nordmann in the [place].  His CT scan findings were reviewed by the radiation oncologist Dr. Tammi Klippel who recommended 30 day prednisone taper which has been started. His CT scan findings could be related to radiation-induced lung injury.  9. Persistent sinus tachycardia. Patient says he has had sinus tachycardia for a while, check TSH, cardiology following, will monitor and follow cardiology recommendations for now he is on beta blocker.   Family Communication  :  None present  Code Status :  Full  Diet : Heart Healthy  Disposition Plan  :  Stay inpatient  Consults  : Pulmonary, Cards  Procedures  :    R .thoracentesis by PCCM 02-14-16 - 1lit borderline transudative/exudative fluid removed.   TTE  Left ventricle: There is diffuse hypokinesis and akinesis of the mid anteroseptal, aterior and anterolateral walls and apical anterior and septal walls. The cavity size was normal. There was mild concentric hypertrophy. Systolic function was moderately to severely reduced. The estimated ejection fraction was in the range of 30% to 35%. Features are consistent with a pseudonormal left ventricular filling pattern, with concomitant abnormal relaxation and increased filling pressure (grade 2 diastolic dysfunction). Doppler parameters are consistent with elevated ventricular end-diastolic filling pressure. - Aortic valve: Trileaflet; normal thickness leaflets. There was no regurgitation. - Aortic root: The aortic root was normal in size. - Mitral valve: Structurally normal valve. There was mild regurgitation. - Left atrium: The atrium was mildly dilated. - Right ventricle: The cavity size was normal. Wall thickness was normal.  Systolic function was normal. - Right atrium: The atrium was normal in size. - Tricuspid valve: There was mild regurgitation. - Pulmonic valve: There was trivial regurgitation. - Pulmonary arteries: Systolic pressure was within the normal range. -  Inferior vena cava: The vessel was normal in size. - Pericardium, extracardiac: There was no pericardial effusion.   CT Chest - Right-sided consolidation with bilateral pleural effusion, reduction in lung cancer and lymph node tumor burden.  DVT Prophylaxis  :  Lovenox   Lab Results  Component Value Date   PLT 134* 02/22/2016    Inpatient Medications  Scheduled Meds: . aspirin EC  81 mg Oral Daily  . atorvastatin  40 mg Oral q1800  . clopidogrel  75 mg Oral Q breakfast  . enoxaparin (LOVENOX) injection  40 mg Subcutaneous Q24H  . ferrous sulfate  325 mg Oral Daily  . fluticasone furoate-vilanterol  1 puff Inhalation Daily  . furosemide  40 mg Oral Daily  . ketorolac  15 mg Intravenous Once  . Living Better with Heart Failure Book   Does not apply Once  . metoprolol  75 mg Oral BID  . pantoprazole  40 mg Oral Daily  . predniSONE  60 mg Oral Q breakfast   Continuous Infusions:  PRN Meds:.acetaminophen **OR** acetaminophen, albuterol, docusate sodium  Antibiotics  :    Anti-infectives    Start     Dose/Rate Route Frequency Ordered Stop   02/22/16 0000  ceFEPIme (MAXIPIME) 2 g in dextrose 5 % 50 mL IVPB  Status:  Discontinued     2 g 100 mL/hr over 30 Minutes Intravenous Every 8 hours 02/21/16 1635 02/21/16 1641   02/21/16 1700  ceFEPIme (MAXIPIME) 2 g in dextrose 5 % 50 mL IVPB  Status:  Discontinued     2 g 100 mL/hr over 30 Minutes Intravenous STAT 02/21/16 1620 02/21/16 1641         Objective:   Filed Vitals:   02/24/16 0709 02/24/16 1000 02/24/16 1012 02/24/16 1039  BP: 134/76   161/73  Pulse: 109   120  Temp:    97.6 F (36.4 C)  TempSrc:      Resp:    20  Height:      Weight:  80.74 kg (178 lb)    SpO2:    95% 97%    Wt Readings from Last 3 Encounters:  02/24/16 80.74 kg (178 lb)  02/21/16 85.276 kg (188 lb)  02/16/16 87.499 kg (192 lb 14.4 oz)     Intake/Output Summary (Last 24 hours) at 02/24/16 1227 Last data filed at 02/24/16 1000  Gross per 24 hour  Intake   1380 ml  Output   2276 ml  Net   -896 ml     Physical Exam  Awake Alert, Oriented X 3, No new F.N deficits, Normal affect [place].AT,PERRAL Supple Neck,No JVD, No cervical lymphadenopathy appriciated.  Symmetrical Chest wall movement, Good air movement bilaterally, few rales RRR,No Gallops,Rubs or new Murmurs, No Parasternal Heave +ve B.Sounds, Abd Soft, No tenderness, No organomegaly appriciated, No rebound - guarding or rigidity. No Cyanosis, Clubbing , trace edema, No new Rash or bruise       Data Review:    CBC  Recent Labs Lab 02/21/16 1116 02/22/16 0325  WBC 7.1 5.4  HGB 8.8* 8.1*  HCT 27.6* 24.9*  PLT 126* 134*  MCV 89.6 86.8  MCH 28.6 28.2  MCHC 31.9* 32.5  RDW 18.4* 18.0*  LYMPHSABS 0.7*  --   MONOABS 0.7  --   EOSABS 0.2  --   BASOSABS 0.0  --     Chemistries   Recent Labs Lab 02/21/16 1116 02/22/16 0325 02/23/16 0339 02/24/16 0318  NA 134* 134* 134* 134*  K 4.6 4.1 3.6 4.2  CL  --  99* 97* 99*  CO2 '28 27 28 27  '$ GLUCOSE 83 100* 123* 136*  BUN 10.'2 13 18 '$ 22*  CREATININE 1.1 1.23 1.43* 1.36*  CALCIUM 9.1 8.6* 8.5* 8.4*  MG  --   --  1.9  --   AST 22  --   --   --   ALT 17  --   --   --   ALKPHOS 93  --   --   --   BILITOT 0.45  --   --   --    ------------------------------------------------------------------------------------------------------------------ No results for input(s): CHOL, HDL, LDLCALC, TRIG, CHOLHDL, LDLDIRECT in the last 72 hours.  No results found for: HGBA1C ------------------------------------------------------------------------------------------------------------------ No results for input(s): TSH, T4TOTAL, T3FREE, THYROIDAB in the last 72 hours.  Invalid  input(s): FREET3 ------------------------------------------------------------------------------------------------------------------ No results for input(s): VITAMINB12, FOLATE, FERRITIN, TIBC, IRON, RETICCTPCT in the last 72 hours.  Coagulation profile No results for input(s): INR, PROTIME in the last 168 hours.  No results for input(s): DDIMER in the last 72 hours.  Cardiac Enzymes No results for input(s): CKMB, TROPONINI, MYOGLOBIN in the last 168 hours.  Invalid input(s): CK ------------------------------------------------------------------------------------------------------------------    Component Value Date/Time   BNP 942.6* 02/21/2016 1552    Micro Results Recent Results (from the past 240 hour(s))  Body fluid culture     Status: None (Preliminary result)   Collection Time: 02/22/16 11:40 AM  Result Value Ref Range Status   Specimen Description PLEURAL RIGHT  Final   Special Requests NONE  Final   Gram Stain   Final    FEW WBC PRESENT, PREDOMINANTLY PMN NO ORGANISMS SEEN    Culture   Final    NO GROWTH 2 DAYS NO ANAEROBES ISOLATED; CULTURE IN PROGRESS FOR 5 DAYS Performed at [facility]    Report Status PENDING  Incomplete    Radiology Reports Dg Chest 2 View  02/21/2016  CLINICAL DATA:  Stage IV lung malignancy status post chemotherapy and radiation; recent episode of pneumonia; increase shortness of breath and orthopnea. EXAM: CHEST  2 VIEW COMPARISON:  PA and lateral chest x-ray of February 16, 2016 FINDINGS: The right upper lobe density has become more confluent. The right lower lung is adequately inflated. There is a small right pleural effusion which is unchanged. The left lung is clear. There are post CABG changes. There is no cardiomegaly or pulmonary edema. The bony thorax exhibits no acute abnormality. IMPRESSION: Worsening of alveolar opacity in the right upper lobe which may reflect pneumonia either typical or postobstructive. Lymphangitic spread of  malignancy could be present as well but is fell less likely than progressive pneumonia. There is no CHF. There is a small right pleural effusion. Electronically Signed   By: David  Martinique M.D.   On: 02/21/2016 11:02   Dg Chest 2 View  02/16/2016  CLINICAL DATA:  Shortness of breath with exertion, fever, cough EXAM: CHEST  2 VIEW COMPARISON:  Chest x-ray of 02/05/2016 FINDINGS: There is parenchymal opacity within the right upper lobe consistent with pneumonia. The left lung is clear. Mediastinal hilar contours are unremarkable. Followup PA and lateral chest X-ray is recommended in 3-4 weeks following trial of antibiotic therapy to ensure resolution and exclude underlying malignancy. The heart is within normal limits in size. No bony abnormality is seen. Median sternotomy sutures are noted from CABG. IMPRESSION: Right upper lobe opacity most consistent with pneumonia. Followup PA and lateral chest X-ray is recommended  in 3-4 weeks following trial of antibiotic therapy to ensure resolution and exclude underlying malignancy. Electronically Signed   By: Ivar Drape M.D.   On: 02/16/2016 15:19   Dg Chest 2 View  02/05/2016  CLINICAL DATA:  Acute onset of fever. Recently finished chemotherapy for right-sided lung cancer. Initial encounter. EXAM: CHEST  2 VIEW COMPARISON:  Chest radiograph performed 11/24/2015, and PET/CT performed 11/25/2015 FINDINGS: The lungs are well-aerated. New patchy right-sided airspace opacity raises concern for pneumonia. The known pulmonary mass is less well characterized on the current study. The left lung appears relatively clear. No pleural effusion or pneumothorax is seen. The heart is normal in size. The patient is status post median sternotomy, with evidence of prior CABG. No acute osseous abnormalities are seen. Scattered clips are noted about the upper abdomen. IMPRESSION: New patchy right-sided airspace opacity raises concern for pneumonia. Known pulmonary mass is less well  characterized on the current study. Electronically Signed   By: Garald Balding M.D.   On: 02/05/2016 21:52   Ct Chest W Contrast  02/21/2016  CLINICAL DATA:  75 year old with current history of squamous cell carcinoma of the right upper lobe, treated with radiation therapy and chemotherapy, completed earlier this month. Patient was admitted 02/05/2016 with community acquired pneumonia. He presents again today with cough and fever. EXAM: CT CHEST WITH CONTRAST TECHNIQUE: Multidetector CT imaging of the chest was performed during intravenous contrast administration. CONTRAST:  61m ISOVUE-300 IOPAMIDOL (ISOVUE-300) INJECTION 61% IV. COMPARISON:  CT CT 11/25/2015.  CTA chest 11/11/2015, 03/10/2012. FINDINGS: Cardiovascular: Normal heart size. Extensive 3 vessel coronary atherosclerosis. Prior sternotomy for CABG. Visualized portions of the coronary grafts patent. No pericardial effusion moderate to severe atherosclerosis involving the thoracic and upper abdominal aorta without aneurysm. Atherosclerosis involving the proximal great vessels without visible stenosis. Mediastinum/Lymph Nodes: Interval marked decrease in size of the pathologic low right paratracheal mediastinal lymph node (station 4R) since the PET-CT, current measurement approximating 1.1 x 1.3 cm (previously 2.0 x 1.8 cm). The hypermetabolic right axillary lymph node on the PET-CT is unchanged to perhaps slightly decreased in size. No new or enlarging lymphadenopathy. Small hiatal hernia. Thyroid gland unremarkable. Lungs/Pleura: Since the PET-CT, marked reduction in size of the right upper lobe lung mass, now measuring approximately 2.2 x 1.9 cm (series 5, image 37), previously 4.3 x 3.4 cm. Interval development of confluent airspace opacities with air bronchograms in the right upper lobe and in the superior segment right lower lobe, associated with a large right pleural effusion. Patchy airspace opacities are present medially in the left upper lobe.  Small left pleural effusion. Upper abdomen: Surgically absent gallbladder. The he visualized proximal portion of the abdominal aortic stent graft is unremarkable. No acute findings. 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 No evidence of osseous metastatic disease. IMPRESSION: 1. Confluent airspace pneumonia involving the right upper lobe and the superior segment right lower lobe, with patchy pneumonia involving the left upper lobe. The right upper lobe pneumonia is superimposed upon post radiation changes. 2. Large right pleural effusion and small left pleural effusion. 3. Interval marked reduction in size of the right upper lobe lung cancer since the prior PET-CT 11/25/2015. Measurements are given above. 4. Interval reduction in size of the pathologic station 4R mediastinal lymph node since the prior PET-CT. No new or enlarging lymphadenopathy. Electronically Signed   By: TEvangeline DakinM.D.   On: 02/21/2016 19:10   Dg Chest Port 1 View  02/23/2016  CLINICAL  DATA:  Shortness of breath. EXAM: PORTABLE CHEST 1 VIEW COMPARISON:  02/22/2016. FINDINGS: Prior CABG. Heart size normal. Right upper lobe infiltrate and atelectasis again noted. No interim change. Continued follow-up exam suggested demonstrate clearing . No pleural effusion or pneumothorax. IMPRESSION: 1.  Prior CABG.  Heart size normal. 2. Right upper lobe infiltrate and atelectasis again noted without significant change. Continued follow-up exams to demonstrate clearing suggested. Electronically Signed   By: Marcello Moores  Register   On: 02/23/2016 06:57   Dg Chest Port 1 View  02/22/2016  CLINICAL DATA:  Pleural effusion, shortness of Breath EXAM: PORTABLE CHEST 1 VIEW COMPARISON:  Chest x-ray and CT 02/21/2016 FINDINGS: Consolidation in the right upper lobe compatible with pneumonia previously seen effusion by CT not definitively visualized on this single view portable chest x-ray. No focal opacity on the  left. Prior CABG. Heart is normal size. No acute bony abnormality. IMPRESSION: Continued consolidation in the right upper lobe most compatible with pneumonia. Previously seen pleural effusions cannot be visualized on this portable study. Electronically Signed   By: Rolm Baptise M.D.   On: 02/22/2016 11:53   Ct Angio Abd/pel W/ And/or W/o  01/25/2016  CLINICAL DATA:  Post stent graft EXAM: CTA ABDOMEN AND PELVIS wITHOUT AND WITH CONTRAST TECHNIQUE: Multidetector CT imaging of the abdomen and pelvis was performed using the standard protocol during bolus administration of intravenous contrast. Multiplanar reconstructed images and MIPs were obtained and reviewed to evaluate the vascular anatomy. 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 There is no evidence of endoleak. Celiac is patent.  Branch vessels patent. 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 This has progressed since the prior study. 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  Branches reconstitute The left renal artery is patent. There continues to be atherosclerotic disease at the origin of the right renal artery. Significant stenosis cannot be excluded. The appearance is stable. Right common iliac artery landing zone is patent. Internal and external iliac arteries are patent Left common iliac artery landing zone is patent. The continues to be a small penetrating atherosclerotic ulcer just be on the landing zone. Left internal and external iliac arteries are patent. Postcholecystectomy Liver, spleen, pancreas, and adrenal glands are within normal limits. Small hypodensities in the kidneys are stable. Normal appendix. Sigmoid diverticulosis without acute diverticulitis. No focal obstructing lesion of the colon Left total hip arthroplasty. Mild  enlargement of the prostate gland. Degenerative disc disease and facet arthropathy at L4-5. No vertebral compression deformity. There is no free-fluid.  No obvious retroperitoneal adenopathy. Review of the MIP images confirms the above findings. IMPRESSION: Aorto bi-iliac stent graft remains patent and there is no evidence of endoleak. 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 Electronically Signed   By: Marybelle Killings M.D.   On: 01/25/2016 16:38    Time Spent in minutes  30   Brandis Matsuura K M.D on 02/24/2016 at 12:27 PM  Between 7am to 7pm - Pager - [phone]  After 7pm go to [email or web address] - password [facility]  [facility] -  Office  [phone]

## 2016-02-24 NOTE — Progress Notes (Signed)
Pt. Ambulates in the hall independently with no oxygen frequently and tolerates it very well. Will continue to monitor pt. Closely.

## 2016-02-24 NOTE — Progress Notes (Signed)
Patient c/o of heart racing HR 120 Vital signs charted Dr Candiss Norse notified with orders received.  Bethann Punches RN

## 2016-02-24 NOTE — Progress Notes (Signed)
Asked by Dr. Oval Linsey to schedule closer f/u - this has been arranged for 7/6 at 10:30am in flex clinic with Nell Range, PA. Appt added to f/u section. Harry Bark PA-C

## 2016-02-24 NOTE — Progress Notes (Signed)
Patient: Paul Sandoval / Admit Date: 02/21/2016 / Date of Encounter: 02/24/2016, 9:12 AM   Subjective: Feeling much better - no further SOB. Did 2 laps around the unit without any limitation. No CP. LEE resolved.   Objective: Telemetry: N/a Physical Exam: Blood pressure 134/76, pulse 109, temperature 97.6 F (36.4 C), temperature source Oral, resp. rate 16, height '5\' 10"'$  (1.778 m), weight 176 lb 12.8 oz (80.196 kg), SpO2 94 %. General: Well developed, well nourished WM in no acute distress. Head: Normocephalic, atraumatic, sclera non-icteric, no xanthomas, nares are without discharge. Neck: Negative for carotid bruits. JVP not elevated. Lungs: Clear bilaterally to auscultation without wheezes, rales, or rhonchi. Breathing is unlabored. Heart: Reg rhythm borderline elevated rate without murmurs, rubs. Question s3 present.  Abdomen: Soft, non-tender, non-distended with normoactive bowel sounds. No rebound/guarding. Extremities: No clubbing or cyanosis. No edema. Distal pedal pulses are 2+ and equal bilaterally. Neuro: Alert and oriented X 3. Moves all extremities spontaneously. Psych:  Responds to questions appropriately with a normal affect.   Intake/Output Summary (Last 24 hours) at 02/24/16 0912 Last data filed at 02/24/16 0800  Gross per 24 hour  Intake   1680 ml  Output   2275 ml  Net   -595 ml    Inpatient Medications:  . aspirin EC  81 mg Oral Daily  . atorvastatin  40 mg Oral q1800  . clopidogrel  75 mg Oral Q breakfast  . enoxaparin (LOVENOX) injection  40 mg Subcutaneous Q24H  . ferrous sulfate  325 mg Oral Daily  . fluticasone furoate-vilanterol  1 puff Inhalation Daily  . furosemide  40 mg Oral Daily  . ketorolac  15 mg Intravenous Once  . metoprolol  50 mg Oral BID  . pantoprazole  40 mg Oral Daily  . predniSONE  60 mg Oral Q breakfast   Infusions:    Labs:  Recent Labs  02/23/16 0339 02/24/16 0318  NA 134* 134*  K 3.6 4.2  CL 97* 99*  CO2 28 27    GLUCOSE 123* 136*  BUN 18 22*  CREATININE 1.43* 1.36*  CALCIUM 8.5* 8.4*  MG 1.9  --     Recent Labs  02/21/16 1116 02/22/16 1140 02/22/16 1147 02/22/16 1601  AST 22  --   --   --   ALT 17  --   --   --   ALKPHOS 93  --   --   --   BILITOT 0.45  --   --   --   PROT 6.7  --  7.2  --   ALBUMIN 2.3* 2.9*  --  2.8*    Recent Labs  02/21/16 1116 02/22/16 0325  WBC 7.1 5.4  NEUTROABS 5.5  --   HGB 8.8* 8.1*  HCT 27.6* 24.9*  MCV 89.6 86.8  PLT 126* 134*   No results for input(s): CKTOTAL, CKMB, TROPONINI in the last 72 hours. Invalid input(s): POCBNP No results for input(s): HGBA1C in the last 72 hours.   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 EXAM: CHEST  2 VIEW COMPARISON:  PA and lateral chest x-ray of February 16, 2016 FINDINGS: The right upper lobe density has become more confluent. The right lower lung is adequately inflated. There is a small right pleural effusion which is unchanged. The left lung is clear. There are post CABG changes. There  is no cardiomegaly or pulmonary edema. The bony thorax exhibits no acute abnormality. IMPRESSION: Worsening of alveolar opacity in the right upper lobe which may reflect pneumonia either typical or postobstructive. Lymphangitic spread of malignancy could be present as well but is fell less likely than progressive pneumonia. There is no CHF. There is a small right pleural effusion. Electronically Signed   By: David  Martinique M.D.   On: 02/21/2016 11:02   Dg Chest 2 View  02/16/2016  CLINICAL DATA:  Shortness of breath with exertion, fever, cough EXAM: CHEST  2 VIEW COMPARISON:  Chest x-ray of 02/05/2016 FINDINGS: There is parenchymal opacity within the right upper lobe consistent with pneumonia. The left lung is clear. Mediastinal hilar contours are unremarkable. Followup PA and  lateral chest X-ray is recommended in 3-4 weeks following trial of antibiotic therapy to ensure resolution and exclude underlying malignancy. The heart is within normal limits in size. No bony abnormality is seen. Median sternotomy sutures are noted from CABG. IMPRESSION: Right upper lobe opacity most consistent with pneumonia. Followup PA and lateral chest X-ray is recommended in 3-4 weeks following trial of antibiotic therapy to ensure resolution and exclude underlying malignancy. Electronically Signed   By: Ivar Drape M.D.   On: 02/16/2016 15:19   Dg Chest 2 View  02/05/2016  CLINICAL DATA:  Acute onset of fever. Recently finished chemotherapy for right-sided lung cancer. Initial encounter. EXAM: CHEST  2 VIEW COMPARISON:  Chest radiograph performed 11/24/2015, and PET/CT performed 11/25/2015 FINDINGS: The lungs are well-aerated. New patchy right-sided airspace opacity raises concern for pneumonia. The known pulmonary mass is less well characterized on the current study. The left lung appears relatively clear. No pleural effusion or pneumothorax is seen. The heart is normal in size. The patient is status post median sternotomy, with evidence of prior CABG. No acute osseous abnormalities are seen. Scattered clips are noted about the upper abdomen. IMPRESSION: New patchy right-sided airspace opacity raises concern for pneumonia. Known pulmonary mass is less well characterized on the current study. Electronically Signed   By: Garald Balding M.D.   On: 02/05/2016 21:52   Ct Chest W Contrast  02/21/2016  CLINICAL DATA:  75 year old with current history of squamous cell carcinoma of the right upper lobe, treated with radiation therapy and chemotherapy, completed earlier this month. Patient was admitted 02/05/2016 with community acquired pneumonia. He presents again today with cough and fever. EXAM: CT CHEST WITH CONTRAST TECHNIQUE: Multidetector CT imaging of the chest was performed during intravenous contrast  administration. CONTRAST:  41m ISOVUE-300 IOPAMIDOL (ISOVUE-300) INJECTION 61% IV. COMPARISON:  CT CT 11/25/2015.  CTA chest 11/11/2015, 03/10/2012. FINDINGS: Cardiovascular: Normal heart size. Extensive 3 vessel coronary atherosclerosis. Prior sternotomy for CABG. Visualized portions of the coronary grafts patent. No pericardial effusion moderate to severe atherosclerosis involving the thoracic and upper abdominal aorta without aneurysm. Atherosclerosis involving the proximal great vessels without visible stenosis. Mediastinum/Lymph Nodes: Interval marked decrease in size of the pathologic low right paratracheal mediastinal lymph node (station 4R) since the PET-CT, current measurement approximating 1.1 x 1.3 cm (previously 2.0 x 1.8 cm). The hypermetabolic right axillary lymph node on the PET-CT is unchanged to perhaps slightly decreased in size. No new or enlarging lymphadenopathy. Small hiatal hernia. Thyroid gland unremarkable. Lungs/Pleura: Since the PET-CT, marked reduction in size of the right upper lobe lung mass, now measuring approximately 2.2 x 1.9 cm (series 5, image 37), previously 4.3 x 3.4 cm. Interval development of confluent airspace opacities with air bronchograms in the  right upper lobe and in the superior segment right lower lobe, associated with a large right pleural effusion. Patchy airspace opacities are present medially in the left upper lobe. Small left pleural effusion. Upper abdomen: Surgically absent gallbladder. The he visualized proximal portion of the abdominal aortic stent graft is unremarkable. No acute findings. 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 No evidence of osseous metastatic disease. IMPRESSION: 1. Confluent airspace pneumonia involving the right upper lobe and the superior segment right lower lobe, with patchy pneumonia involving the left upper lobe. The right upper lobe pneumonia is superimposed upon post radiation changes.  2. Large right pleural effusion and small left pleural effusion. 3. Interval marked reduction in size of the right upper lobe lung cancer since the prior PET-CT 11/25/2015. Measurements are given above. 4. Interval reduction in size of the pathologic station 4R mediastinal lymph node since the prior PET-CT. No new or enlarging lymphadenopathy. Electronically Signed   By: Evangeline Dakin M.D.   On: 02/21/2016 19:10   Dg Chest Port 1 View  02/23/2016  CLINICAL DATA:  Shortness of breath. EXAM: PORTABLE CHEST 1 VIEW COMPARISON:  02/22/2016. FINDINGS: Prior CABG. Heart size normal. Right upper lobe infiltrate and atelectasis again noted. No interim change. Continued follow-up exam suggested demonstrate clearing . No pleural effusion or pneumothorax. IMPRESSION: 1.  Prior CABG.  Heart size normal. 2. Right upper lobe infiltrate and atelectasis again noted without significant change. Continued follow-up exams to demonstrate clearing suggested. Electronically Signed   By: Marcello Moores  Register   On: 02/23/2016 06:57   Dg Chest Port 1 View  02/22/2016  CLINICAL DATA:  Pleural effusion, shortness of Breath EXAM: PORTABLE CHEST 1 VIEW COMPARISON:  Chest x-ray and CT 02/21/2016 FINDINGS: Consolidation in the right upper lobe compatible with pneumonia previously seen effusion by CT not definitively visualized on this single view portable chest x-ray. No focal opacity on the left. Prior CABG. Heart is normal size. No acute bony abnormality. IMPRESSION: Continued consolidation in the right upper lobe most compatible with pneumonia. Previously seen pleural effusions cannot be visualized on this portable study. Electronically Signed   By: Rolm Baptise M.D.   On: 02/22/2016 11:53   Ct Angio Abd/pel W/ And/or W/o  01/25/2016  CLINICAL DATA:  Post stent graft EXAM: CTA ABDOMEN AND PELVIS wITHOUT AND WITH CONTRAST TECHNIQUE: Multidetector CT imaging of the abdomen and pelvis was performed using the standard protocol during bolus  administration of intravenous contrast. Multiplanar reconstructed images and MIPs were obtained and reviewed to evaluate the vascular anatomy. 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 There is no evidence of endoleak. Celiac is patent.  Branch vessels patent. 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 This has progressed since the prior study. 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  Branches reconstitute The left renal artery is patent. There continues to be atherosclerotic disease at the origin of the right renal artery. Significant stenosis cannot be excluded. The appearance is stable. Right common iliac artery landing zone is patent. Internal and external iliac arteries are patent Left common iliac artery landing zone is patent. The continues to be a small penetrating atherosclerotic ulcer just be on the landing zone. Left internal and external iliac arteries are patent. Postcholecystectomy Liver, spleen, pancreas, and adrenal glands  are within normal limits. Small hypodensities in the kidneys are stable. Normal appendix. Sigmoid diverticulosis without acute diverticulitis. No focal obstructing lesion of the colon Left total hip arthroplasty. Mild enlargement of the prostate gland. Degenerative disc disease and facet arthropathy at L4-5. No vertebral compression deformity. There is no free-fluid.  No obvious retroperitoneal adenopathy. Review of the MIP images confirms the above findings. IMPRESSION: Aorto bi-iliac stent graft remains patent and there is no evidence of endoleak. 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 Electronically Signed   By: Marybelle Killings M.D.   On: 01/25/2016 16:38     Assessment and Plan  42M with CAD  (s/p CABG 1996, LHC 02/2014 s/p DES to dLM & staged PCI of SVG-diag), carotid disease and aortic stent graft followed by Dr. 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 Previously admitted 6/11-6/14 for CAP, AKI on CKD, hyponatremia, s/p treatment with IVF initially then fluid restriction and sodium tablet. 2D Echo 01/2016: EF 30-35%, grade 2 DD, elevated LVEDP. S/p thoracentesis 02/22/16.  1. 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 Will review HR with MD - his HR has remained >100 ever since earlier this month. 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 Hold off ACEI/ARB at present time given renal insufficiency and recent diuresis. Will order weight for this AM. Would plan to arrange outpatient stress test if patient doing well in follow-up. Reviewed daily weights, sodium/fluid restriction. Rx CHF book.  2. CAD - continue ASA, Plavix, BB, statin - advise f/u of Hgb as outpatient given anemia and antiplatelet therapy.  3. HTN - controlled.  4. CKD stage III - baseline Cr appears 1.3-1.6.  5. Progressive anemia - Hgb was in the 10-11 range in the spring. Down to 7.3 earlier this month requiring transfusion. Remains in  the 8 range. Further per IM. Per their notes, r/t chemotherapy. Patient denies any evidence of bleeding. This seems to correlate with gradual progression of his sinus tach.   Signed, Melina Copa PA-C Pager: [phone]

## 2016-02-24 NOTE — Progress Notes (Signed)
Report called to [facility] on [place]

## 2016-02-24 NOTE — Telephone Encounter (Signed)
New message   TOC appt per Melina Copa - 7.6.2017

## 2016-02-25 DIAGNOSIS — R509 Fever, unspecified: Secondary | ICD-10-CM | POA: Diagnosis not present

## 2016-02-25 DIAGNOSIS — K5730 Diverticulosis of large intestine without perforation or abscess without bleeding: Secondary | ICD-10-CM | POA: Diagnosis not present

## 2016-02-25 DIAGNOSIS — I5043 Acute on chronic combined systolic (congestive) and diastolic (congestive) heart failure: Secondary | ICD-10-CM | POA: Diagnosis not present

## 2016-02-25 DIAGNOSIS — I252 Old myocardial infarction: Secondary | ICD-10-CM | POA: Diagnosis not present

## 2016-02-25 DIAGNOSIS — D6481 Anemia due to antineoplastic chemotherapy: Secondary | ICD-10-CM | POA: Diagnosis not present

## 2016-02-25 DIAGNOSIS — R0602 Shortness of breath: Secondary | ICD-10-CM | POA: Diagnosis not present

## 2016-02-25 DIAGNOSIS — K449 Diaphragmatic hernia without obstruction or gangrene: Secondary | ICD-10-CM | POA: Diagnosis not present

## 2016-02-25 DIAGNOSIS — I255 Ischemic cardiomyopathy: Secondary | ICD-10-CM | POA: Diagnosis not present

## 2016-02-25 DIAGNOSIS — Z8261 Family history of arthritis: Secondary | ICD-10-CM | POA: Diagnosis not present

## 2016-02-25 DIAGNOSIS — K219 Gastro-esophageal reflux disease without esophagitis: Secondary | ICD-10-CM | POA: Diagnosis not present

## 2016-02-25 DIAGNOSIS — J189 Pneumonia, unspecified organism: Secondary | ICD-10-CM | POA: Diagnosis not present

## 2016-02-25 DIAGNOSIS — N183 Chronic kidney disease, stage 3 (moderate): Secondary | ICD-10-CM | POA: Diagnosis not present

## 2016-02-25 DIAGNOSIS — I714 Abdominal aortic aneurysm, without rupture: Secondary | ICD-10-CM | POA: Diagnosis not present

## 2016-02-25 DIAGNOSIS — I130 Hypertensive heart and chronic kidney disease with heart failure and stage 1 through stage 4 chronic kidney disease, or unspecified chronic kidney disease: Secondary | ICD-10-CM | POA: Diagnosis not present

## 2016-02-25 DIAGNOSIS — R000 Tachycardia, unspecified: Secondary | ICD-10-CM | POA: Diagnosis not present

## 2016-02-25 DIAGNOSIS — I2510 Atherosclerotic heart disease of native coronary artery without angina pectoris: Secondary | ICD-10-CM | POA: Diagnosis not present

## 2016-02-25 DIAGNOSIS — J90 Pleural effusion, not elsewhere classified: Secondary | ICD-10-CM | POA: Diagnosis not present

## 2016-02-25 DIAGNOSIS — D6959 Other secondary thrombocytopenia: Secondary | ICD-10-CM | POA: Diagnosis not present

## 2016-02-25 DIAGNOSIS — I2583 Coronary atherosclerosis due to lipid rich plaque: Secondary | ICD-10-CM | POA: Diagnosis not present

## 2016-02-25 DIAGNOSIS — E785 Hyperlipidemia, unspecified: Secondary | ICD-10-CM | POA: Diagnosis not present

## 2016-02-25 DIAGNOSIS — Z923 Personal history of irradiation: Secondary | ICD-10-CM | POA: Diagnosis not present

## 2016-02-25 DIAGNOSIS — J449 Chronic obstructive pulmonary disease, unspecified: Secondary | ICD-10-CM | POA: Diagnosis not present

## 2016-02-25 DIAGNOSIS — I739 Peripheral vascular disease, unspecified: Secondary | ICD-10-CM | POA: Diagnosis not present

## 2016-02-25 DIAGNOSIS — R0601 Orthopnea: Secondary | ICD-10-CM | POA: Diagnosis not present

## 2016-02-25 DIAGNOSIS — C3411 Malignant neoplasm of upper lobe, right bronchus or lung: Secondary | ICD-10-CM | POA: Diagnosis not present

## 2016-02-25 DIAGNOSIS — I4510 Unspecified right bundle-branch block: Secondary | ICD-10-CM | POA: Diagnosis not present

## 2016-02-25 MED ORDER — FUROSEMIDE 40 MG PO TABS: 40.0000 mg | ORAL_TABLET | Freq: Every day | ORAL | Status: DC

## 2016-02-25 MED ORDER — METOPROLOL TARTRATE 75 MG PO TABS: 75.0000 mg | ORAL_TABLET | Freq: Two times a day (BID) | ORAL | Status: DC

## 2016-02-25 MED ORDER — PREDNISONE 5 MG PO TABS: ORAL_TABLET | ORAL | Status: DC

## 2016-02-25 NOTE — Discharge Instructions (Signed)
Follow with Primary MD [name], MD in 7 days   Get CBC, CMP, 2 view Chest X ray checked  by Primary MD or SNF MD in 5-7 days ( we routinely change or add medications that can affect your baseline labs and fluid status, therefore we recommend that you get the mentioned basic workup next visit with your PCP, your PCP may decide not to get them or add new tests based on their clinical decision)   Activity: As tolerated with Full fall precautions use walker/cane & assistance as needed   Disposition Home     Diet:   Heart Healthy Check your Weight same time everyday, if you gain over 2 pounds, or you develop in leg swelling, experience more shortness of breath or chest pain, call your Primary MD immediately. Follow Cardiac Low Salt Diet and 1.5 lit/day fluid restriction.   On your next visit with your primary care physician please Get Medicines reviewed and adjusted.   Please request your Prim.MD to go over all Hospital Tests and Procedure/Radiological results at the follow up, please get all Hospital records sent to your Prim MD by signing hospital release before you go home.   If you experience worsening of your admission symptoms, develop shortness of breath, life threatening emergency, suicidal or homicidal thoughts you must seek medical attention immediately by calling 911 or calling your MD immediately  if symptoms less severe.  You Must read complete instructions/literature along with all the possible adverse reactions/side effects for all the Medicines you take and that have been prescribed to you. Take any new Medicines after you have completely understood and accpet all the possible adverse reactions/side effects.   Do not drive, operate heavy machinery, perform activities at heights, swimming or participation in water activities or provide baby sitting services if your were admitted for syncope or siezures until you have seen by Primary MD or a Neurologist and advised to do so  again.  Do not drive when taking Pain medications.    Do not take more than prescribed Pain, Sleep and Anxiety Medications  Special Instructions: If you have smoked or chewed Tobacco  in the last 2 yrs please stop smoking, stop any regular Alcohol  and or any Recreational drug use.  Wear Seat belts while driving.   Please note  You were cared for by a hospitalist during your hospital stay. If you have any questions about your discharge medications or the care you received while you were in the hospital after you are discharged, you can call the unit and asked to speak with the hospitalist on call if the hospitalist that took care of you is not available. Once you are discharged, your primary care physician will handle any further medical issues. Please note that NO REFILLS for any discharge medications will be authorized once you are discharged, as it is imperative that you return to your primary care physician (or establish a relationship with a primary care physician if you do not have one) for your aftercare needs so that they can reassess your need for medications and monitor your lab values.

## 2016-02-25 NOTE — Progress Notes (Signed)
Patient: Paul Sandoval / Admit Date: 02/21/2016 / Date of Encounter: 02/25/2016, 7:41 AM   Subjective: Ambulating without oxygen requirement. HR improved, but still in the high 90's.  Objective Physical Exam: Blood pressure 116/54, pulse 96, temperature 97.7 F (36.5 C), temperature source Oral, resp. rate 18, height '5\' 10"'$  (1.778 m), weight 176 lb 5.9 oz (80 kg), SpO2 94 %. General: Well developed, well nourished WM in no acute distress. Head: Normocephalic, atraumatic, sclera non-icteric, no xanthomas, nares are without discharge. Neck: Negative for carotid bruits. JVP not elevated. Lungs: Clear bilaterally to auscultation without wheezes, rales, or rhonchi. Breathing is unlabored. Heart: Reg rhythm borderline elevated rate without murmurs, rubs. Question s3 present.  Abdomen: Soft, non-tender, non-distended with normoactive bowel sounds. No rebound/guarding. Extremities: No clubbing or cyanosis. No edema. Distal pedal pulses are 2+ and equal bilaterally. Neuro: Alert and oriented X 3. Moves all extremities spontaneously. Psych:  Responds to questions appropriately with a normal affect.   Intake/Output Summary (Last 24 hours) at 02/25/16 0741 Last data filed at 02/25/16 0318  Gross per 24 hour  Intake   1440 ml  Output   2276 ml  Net   -836 ml    Inpatient Medications:  . aspirin EC  81 mg Oral Daily  . atorvastatin  40 mg Oral q1800  . clopidogrel  75 mg Oral Q breakfast  . enoxaparin (LOVENOX) injection  40 mg Subcutaneous Q24H  . ferrous sulfate  325 mg Oral Daily  . fluticasone furoate-vilanterol  1 puff Inhalation Daily  . furosemide  40 mg Oral Daily  . ketorolac  15 mg Intravenous Once  . metoprolol  75 mg Oral BID  . pantoprazole  40 mg Oral Daily  . predniSONE  60 mg Oral Q breakfast   Infusions:    Labs:  Recent Labs  02/23/16 0339 02/24/16 0318  NA 134* 134*  K 3.6 4.2  CL 97* 99*  CO2 28 27  GLUCOSE 123* 136*  BUN 18 22*  CREATININE 1.43* 1.36*    CALCIUM 8.5* 8.4*  MG 1.9  --     Recent Labs  02/22/16 1140 02/22/16 1147 02/22/16 1601  PROT  --  7.2  --   ALBUMIN 2.9*  --  2.8*   No results for input(s): WBC, NEUTROABS, HGB, HCT, MCV, PLT in the last 72 hours. No results for input(s): CKTOTAL, CKMB, TROPONINI in the last 72 hours. Invalid input(s): POCBNP No results for input(s): HGBA1C in the last 72 hours.   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 EXAM: CHEST  2 VIEW COMPARISON:  PA and lateral chest x-ray of February 16, 2016 FINDINGS: The right upper lobe density has become more confluent. The right lower lung is adequately inflated. There is a small right pleural effusion which is unchanged. The left lung is clear. There are post CABG changes. There is no cardiomegaly or pulmonary edema. The bony thorax exhibits no acute abnormality. IMPRESSION: Worsening of alveolar opacity in the right upper lobe which may reflect pneumonia either typical or postobstructive. Lymphangitic spread of malignancy could be present as well but is fell less likely than progressive pneumonia. There is no CHF. There is a small right pleural effusion. Electronically Signed   By: David  Martinique M.D.   On: 02/21/2016 11:02   Dg Chest 2 View  02/16/2016  CLINICAL DATA:  Shortness of breath with exertion, fever, cough EXAM: CHEST  2 VIEW COMPARISON:  Chest x-ray of 02/05/2016 FINDINGS: There is parenchymal opacity within the right upper lobe consistent with pneumonia. The left lung is clear. Mediastinal hilar contours are unremarkable. Followup PA and lateral chest X-ray is recommended in 3-4 weeks following trial of antibiotic therapy to ensure resolution and exclude underlying malignancy. The heart is within normal limits in size. No bony abnormality is seen. Median sternotomy sutures are noted from  CABG. IMPRESSION: Right upper lobe opacity most consistent with pneumonia. Followup PA and lateral chest X-ray is recommended in 3-4 weeks following trial of antibiotic therapy to ensure resolution and exclude underlying malignancy. Electronically Signed   By: Ivar Drape M.D.   On: 02/16/2016 15:19   Dg Chest 2 View  02/05/2016  CLINICAL DATA:  Acute onset of fever. Recently finished chemotherapy for right-sided lung cancer. Initial encounter. EXAM: CHEST  2 VIEW COMPARISON:  Chest radiograph performed 11/24/2015, and PET/CT performed 11/25/2015 FINDINGS: The lungs are well-aerated. New patchy right-sided airspace opacity raises concern for pneumonia. The known pulmonary mass is less well characterized on the current study. The left lung appears relatively clear. No pleural effusion or pneumothorax is seen. The heart is normal in size. The patient is status post median sternotomy, with evidence of prior CABG. No acute osseous abnormalities are seen. Scattered clips are noted about the upper abdomen. IMPRESSION: New patchy right-sided airspace opacity raises concern for pneumonia. Known pulmonary mass is less well characterized on the current study. Electronically Signed   By: Garald Balding M.D.   On: 02/05/2016 21:52   Ct Chest W Contrast  02/21/2016  CLINICAL DATA:  75 year old with current history of squamous cell carcinoma of the right upper lobe, treated with radiation therapy and chemotherapy, completed earlier this month. Patient was admitted 02/05/2016 with community acquired pneumonia. He presents again today with cough and fever. EXAM: CT CHEST WITH CONTRAST TECHNIQUE: Multidetector CT imaging of the chest was performed during intravenous contrast administration. CONTRAST:  4m ISOVUE-300 IOPAMIDOL (ISOVUE-300) INJECTION 61% IV. COMPARISON:  CT CT 11/25/2015.  CTA chest 11/11/2015, 03/10/2012. FINDINGS: Cardiovascular: Normal heart size. Extensive 3 vessel coronary atherosclerosis. Prior sternotomy  for CABG. Visualized portions of the coronary grafts patent. No pericardial effusion moderate to severe atherosclerosis involving the thoracic and upper abdominal aorta without aneurysm. Atherosclerosis involving the proximal great vessels without visible stenosis. Mediastinum/Lymph Nodes: Interval marked decrease in size of the pathologic low right paratracheal mediastinal lymph node (station 4R) since the PET-CT, current measurement approximating 1.1 x 1.3 cm (previously 2.0 x 1.8 cm). The hypermetabolic right axillary lymph node on the PET-CT is unchanged to perhaps slightly decreased in size. No new or enlarging lymphadenopathy. Small hiatal hernia. Thyroid gland unremarkable. Lungs/Pleura: Since the PET-CT, marked reduction in size of the right upper lobe lung mass, now measuring approximately 2.2 x 1.9 cm (series 5, image 37), previously 4.3 x 3.4 cm. Interval development of confluent airspace opacities with air bronchograms in the right upper lobe and in the superior segment right lower lobe, associated with a large right pleural effusion. Patchy airspace opacities are present medially in the left upper lobe. Small left pleural effusion. Upper abdomen: Surgically absent gallbladder. The he visualized proximal portion of the abdominal aortic stent graft is unremarkable. No acute findings. 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 No evidence of osseous metastatic disease. IMPRESSION: 1. Confluent airspace pneumonia involving the right upper lobe and the superior segment  right lower lobe, with patchy pneumonia involving the left upper lobe. The right upper lobe pneumonia is superimposed upon post radiation changes. 2. Large right pleural effusion and small left pleural effusion. 3. Interval marked reduction in size of the right upper lobe lung cancer since the prior PET-CT 11/25/2015. Measurements are given above. 4. Interval reduction in size of the pathologic  station 4R mediastinal lymph node since the prior PET-CT. No new or enlarging lymphadenopathy. Electronically Signed   By: Evangeline Dakin M.D.   On: 02/21/2016 19:10   Ct Angio Chest Pe W Or Wo Contrast  02/24/2016  CLINICAL DATA:  75 year old male with tachycardia EXAM: CT ANGIOGRAPHY CHEST WITH CONTRAST TECHNIQUE: Multidetector CT imaging of the chest was performed using the standard protocol during bolus administration of intravenous contrast. Multiplanar CT image reconstructions and MIPs were obtained to evaluate the vascular anatomy. CONTRAST:  100 mL Isovue 370 COMPARISON:  Chest x-ray 02/23/2016; recent CT scan of the chest 02/21/2016; PET-CT 11/25/2015 FINDINGS: Mediastinum: No mediastinal mass or suspicious adenopathy. Small hiatal hernia. Unremarkable visualized thyroid gland. Heart/Vascular: Adequate opacification of the pulmonary arteries to the proximal subsegmental level. No evidence of pulmonary embolus. Patient is status post coronary artery bypass grafting with a patent LIMA to distal LAD graft, and patent aortic to posterior descending coronary artery. A metallic stent is present in the left main coronary artery. The heart is normal in size. No pericardial effusion. No aortic aneurysm. Conventional 3 vessel arch anatomy. Lungs/Pleura: Persistent but improving right layering pleural effusion. Trace left effusion is also slightly smaller. Similar appearance of architectural distortion and diffuse predominantly interstitial opacities throughout the right upper lobe and superior segment of the right lower lobe. Findings remain most consistent with changes of prior external beam radiation therapy. Similar changes are present in the medial aspect of the lingula. No new focal airspace consolidation, or mass. The soft tissue nodule in the medial aspect of the right upper lobe remains essentially unchanged at 2.0 x 1.8 cm. Bones/Soft Tissues: No acute fracture or aggressive appearing lytic or blastic  osseous lesion. Upper Abdomen: Surgical changes of prior cholecystectomy. Otherwise, no acute findings in the upper abdomen. Review of the MIP images confirms the above findings. IMPRESSION: 1. Negative for acute pulmonary embolus, pneumonia or other acute cardiopulmonary process. 2. Similar appearance of post radiation changes in the right upper lobe, superior segment of the right lower lobe and medial aspect of the left upper lobe. 3. Decreasing (compared to 02/21/2016) bilateral pleural effusions now small on the right and trace on the left. 4. Stable right upper lobe pulmonary nodule status post treatment with radiation therapy. Electronically Signed   By: Jacqulynn Cadet M.D.   On: 02/24/2016 14:46   Dg Chest Port 1 View  02/23/2016  CLINICAL DATA:  Shortness of breath. EXAM: PORTABLE CHEST 1 VIEW COMPARISON:  02/22/2016. FINDINGS: Prior CABG. Heart size normal. Right upper lobe infiltrate and atelectasis again noted. No interim change. Continued follow-up exam suggested demonstrate clearing . No pleural effusion or pneumothorax. IMPRESSION: 1.  Prior CABG.  Heart size normal. 2. Right upper lobe infiltrate and atelectasis again noted without significant change. Continued follow-up exams to demonstrate clearing suggested. Electronically Signed   By: Marcello Moores  Register   On: 02/23/2016 06:57   Dg Chest Port 1 View  02/22/2016  CLINICAL DATA:  Pleural effusion, shortness of Breath EXAM: PORTABLE CHEST 1 VIEW COMPARISON:  Chest x-ray and CT 02/21/2016 FINDINGS: Consolidation in the right upper lobe compatible with pneumonia previously  seen effusion by CT not definitively visualized on this single view portable chest x-ray. No focal opacity on the left. Prior CABG. Heart is normal size. No acute bony abnormality. IMPRESSION: Continued consolidation in the right upper lobe most compatible with pneumonia. Previously seen pleural effusions cannot be visualized on this portable study. Electronically Signed   By:  Rolm Baptise M.D.   On: 02/22/2016 11:53     Assessment and Plan  36M with CAD (s/p CABG 1996, LHC 02/2014 s/p DES to dLM & staged PCI of SVG-diag), carotid disease and aortic stent graft followed by Dr. 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 Previously admitted 6/11-6/14 for CAP, AKI on CKD, hyponatremia, s/p treatment with IVF initially then fluid restriction and sodium tablet. 2D Echo 01/2016: EF 30-35%, grade 2 DD, elevated LVEDP. S/p thoracentesis 02/22/16.  1. Acute combined CHF with cardiomyopathy - may be related to chemotherapy, however, there are regional wall motion abnormalities. Agree with outpatient ischemia evaluation which is ordered. Would continue Lasix at present dose. B-blocker increased yesterday - HR remains in the upper 90's.  2. CAD - continue ASA, Plavix, BB, statin - advise f/u of Hgb as outpatient given anemia and antiplatelet therapy.  3. HTN - controlled.  4. CKD stage III - baseline Cr appears 1.3-1.6.  5. Progressive anemia - Hgb was in the 10-11 range in the spring. Down to 7.3 earlier this month requiring transfusion. Remains in  the 8 range. Would suggest recheck H/H as this could be contributing to tachycardia as well.  Outpatient TOC follow-up scheduled on 7/6. Will obtain outpatient myoview. No further suggestions at this time. Probably ready for discharge. Call with questions, cardiology will sign-off.   Pixie Casino, MD, [facility] Attending Cardiologist [place]

## 2016-02-25 NOTE — Discharge Summary (Signed)
Paul Sandoval FUX:[id number] DOB: 1940/10/29 DOA: 02/21/2016  PCP: [name], MD  Admit date: 02/21/2016  Discharge date: 02/25/2016  Admitted From: Home   Disposition:  Home   Recommendations for Outpatient Follow-up:   Follow up with PCP in 1-2 weeks  PCP Please obtain BMP/CBC in one week, 2 view CXR (see Discharge instructions)   PCP Please follow up on the following pending results: None   Home Health: None   Equipment/Devices: None Discharge Condition: Fair   CODE STATUS: Full Diet Recommendation: Heart healthy Consultations: Cardiology, pulmonary   Brief history of present illness from the day of admission and additional interim summary    Paul Sandoval is a 75 y.o. male with a past medical history significant for NSCLC Stage IIIA (T2b, N2, M0), COPD, peripheral vascular disease, and CAD s/p CABG and decreased LV function EF 45% in 2015 who presents with fever and cough.  The patient was diagnosed with lung cancer in March and completed radiation + carboplatin/paclitaxel therapy in early June. He was admitted here 6/11/-6/14 with fever and cough, diagnosed with CAP. That hospitalization was complicated by hyponatremia which was treated with salt tabs. After discharge he came to the [facility] same-day clinic on 6/22 with persistent low-grade fevers (100.52F) and non-productive cough and so his antibiotics were continued 5 days.   Since then, he has had continued cough and intermittent low grade fevers. He has some mild ankle swelling and orthopnea at night that resolves with sitting up in a recliner. He has been walking laps in his house 4x per day, and not felt SOB with exertion. He is not producing sputum, has no chest pain, pleuritic pain. His albuterol is not helping.   Today he had his  follow up at [facility] and was complaining of above. Was afebrile but tachycardic. CXR was repeated that showed worsening RUL opacity, and so [facility] was asked to accept as direct admit from [facility] for pneumonia failed outpatient therapy. Plan was discussed with Dr. Irene Limbo on-call to go ahead and order staging CT planned for next week.      Hospital issues addressed    1.Shortness of breath which actually is orthopnea and PND with elevated BNP. Patient has history of lung cancer with CT evidence of right-sided consolidation and bilateral pleural effusion. Symptoms most likely CHF related.  Although CT scan appears to be consistent with pneumonia patient was surprisingly nontoxic appearing, pro-calcitonin was stable, no fevers or leukocytosis, no cough. His BNP was elevated and he has trace edema on exam. His symptoms were more consistent with acute on chronic him by an diastolic and systolic CHF with EF 02%, he felt much better after IV Lasix, he was also seen by cardiology and continued with diuresis. He is currently sitting in chair symptom free without any oxygen demand. He is better after thoracentesis and diuresis with Lasix. He is currently symptom free and will be placed on Lasix upon discharge, no oxygen requirement, no fever, no productive cough. We'll discharge with outpatient  Cards and pulmonary follow-up.  Currently I do not think he had pneumonia, thoracentesis shows borderline exudative/transudative fluid as well, pulmonary following. He he does remain at risk of developing pneumonia due to his malignancy with possibility of postobstructive infection. Of note he had IV antibiotics and long course of oral antibiotics recently.   2. Underlying COPD. At baseline no wheezing. Supportive care.  3. History of CAD with essential hypertension. No chest pain. Likely has underlying chronic systolic CHF last EF 71% down from 45-50% 2 years ago. Plan is to continue aspirin, Plavix, beta  blocker and statin. Symptoms improved with Lasix which will be continued, cardiology consulted. We will monitor closely. He will Likely require outpatient Lexiscan.  4. 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 No need for packed RBC transfusion or Platelet replacement at this time.  5. CK D stage III. Baseline creatinine close to 1.4 he is at baseline PCP please monitor with diuresis.  6. GERD. On PPI.  7. Dyslipidemia. On statin.  8. Right Lung NSCLC Stage IIIA (T2b, N2, M0) - undergoing chemotherapy, for now plan as in #1 above thereafter follow up with Dr. Julien Nordmann in the [place].  His CT scan findings were reviewed by the radiation oncologist Dr. Tammi Klippel who recommended 30 day prednisone taper which has been started. His CT scan findings could be related to radiation-induced lung injury. Have placed him on prednisone taper with outpatient pulmonary follow-up as well after discharge.  9. Persistent sinus tachycardia. Patient says he has had sinus tachycardia for a while, stable TSH, CT angiogram chest negative for PE, stable echocardiogram, beta blocker dose adjusted patient's symptom-free will be discharged with cardiology follow-up, seen by cardiology today, discussed with Dr. Debara Pickett agrees with discharge.    Discharge diagnosis     Principal Problem:   Fever, unspecified Active Problems:   Chronic obstructive airway disease with asthma (HCC)   CKD (chronic kidney disease) stage 3, GFR 30-59 ml/min   Anemia due to antineoplastic chemotherapy   Pleural effusion   SOB (shortness of breath)   Acute on chronic combined systolic and diastolic heart failure (HCC)   Coronary artery disease due to lipid rich plaque   Sinus tachycardia [facility])    Discharge instructions    Discharge Instructions    Discharge instructions    Complete by:  As directed   Follow with Primary MD [name], MD in 7 days   Get CBC, CMP, 2 view Chest X ray checked  by  Primary MD or SNF MD in 5-7 days ( we routinely change or add medications that can affect your baseline labs and fluid status, therefore we recommend that you get the mentioned basic workup next visit with your PCP, your PCP may decide not to get them or add new tests based on their clinical decision)   Activity: As tolerated with Full fall precautions use walker/cane & assistance as needed   Disposition Home     Diet:   Heart Healthy Check your Weight same time everyday, if you gain over 2 pounds, or you develop in leg swelling, experience more shortness of breath or chest pain, call your Primary MD immediately. Follow Cardiac Low Salt Diet and 1.5 lit/day fluid restriction.   On your next visit with your primary care physician please Get Medicines reviewed and adjusted.   Please request your Prim.MD to go over all Hospital Tests and Procedure/Radiological results at the follow up, please get all Hospital records sent to your  Prim MD by signing hospital release before you go home.   If you experience worsening of your admission symptoms, develop shortness of breath, life threatening emergency, suicidal or homicidal thoughts you must seek medical attention immediately by calling 911 or calling your MD immediately  if symptoms less severe.  You Must read complete instructions/literature along with all the possible adverse reactions/side effects for all the Medicines you take and that have been prescribed to you. Take any new Medicines after you have completely understood and accpet all the possible adverse reactions/side effects.   Do not drive, operate heavy machinery, perform activities at heights, swimming or participation in water activities or provide baby sitting services if your were admitted for syncope or siezures until you have seen by Primary MD or a Neurologist and advised to do so again.  Do not drive when taking Pain medications.    Do not take more than prescribed Pain, Sleep  and Anxiety Medications  Special Instructions: If you have smoked or chewed Tobacco  in the last 2 yrs please stop smoking, stop any regular Alcohol  and or any Recreational drug use.  Wear Seat belts while driving.   Please note  You were cared for by a hospitalist during your hospital stay. If you have any questions about your discharge medications or the care you received while you were in the hospital after you are discharged, you can call the unit and asked to speak with the hospitalist on call if the hospitalist that took care of you is not available. Once you are discharged, your primary care physician will handle any further medical issues. Please note that NO REFILLS for any discharge medications will be authorized once you are discharged, as it is imperative that you return to your primary care physician (or establish a relationship with a primary care physician if you do not have one) for your aftercare needs so that they can reassess your need for medications and monitor your lab values.     Increase activity slowly    Complete by:  As directed            Discharge Medications     Medication List    STOP taking these medications        levofloxacin 750 MG tablet  Commonly known as:  LEVAQUIN      TAKE these medications        acetaminophen 500 MG tablet  Commonly known as:  TYLENOL  Take 500 mg by mouth every 6 (six) hours as needed for mild pain.     aspirin EC 81 MG tablet  Take 81 mg by mouth daily.     CENTRUM SILVER ADULT 50+ Tabs  Take 1 tablet by mouth daily.     clopidogrel 75 MG tablet  Commonly known as:  PLAVIX  Take 1 tablet (75 mg total) by mouth daily. Reported on 12/02/2015     docusate sodium 100 MG capsule  Commonly known as:  COLACE  Take 100 mg by mouth daily as needed for mild constipation or moderate constipation.     fluticasone furoate-vilanterol 100-25 MCG/INH Aepb  Commonly known as:  BREO ELLIPTA  Inhale 1 puff into the lungs  daily.     furosemide 40 MG tablet  Commonly known as:  LASIX  Take 1 tablet (40 mg total) by mouth daily.     Iron 28 MG Tabs  Take 28 mg by mouth daily.     Metoprolol Tartrate 75 MG Tabs  Take 75 mg by mouth 2 (two) times daily. Dispense as needed 1 month supply only     nitroGLYCERIN 0.4 MG SL tablet  Commonly known as:  NITROSTAT  Place 1 tablet (0.4 mg total) under the tongue every 5 (five) minutes as needed for chest pain.     omeprazole 20 MG capsule  Commonly known as:  PRILOSEC  TAKE ONE CAPSULE BY MOUTH DAILY.     predniSONE 5 MG tablet  Commonly known as:  DELTASONE  Label  & dispense according to the schedule below. 0 refills. Take '60mg'$  PO daily x 5 days, then '50mg'$  x 5 days, then '40mg'$  x 5 days, then '30mg'$  x 5 days, then '20mg'$  x 5 days, then '10mg'$  x 5 days, then '5mg'$  x 5 days, then 2.'5mg'$  x 5 days, then 1 mg x 5 days, then 0.'5mg'$  x 5 days and stop.     prochlorperazine 10 MG tablet  Commonly known as:  COMPAZINE  TAKE (1) TABLET BY MOUTH EVERY SIX HOURS AS NEEDED FOR NAUSEA AND VOMITING.     simvastatin 80 MG tablet  Commonly known as:  ZOCOR  Take 0.5 tablets (40 mg total) by mouth at bedtime.     sodium chloride 1 g tablet  Take 2 tablets (2 g total) by mouth 2 (two) times daily with a meal.     sucralfate 1 g tablet  Commonly known as:  CARAFATE  Dissolve in 8 oz water and drink 5 minutes prior to meals and at bedtime     VENTOLIN HFA 108 (90 Base) MCG/ACT inhaler  Generic drug:  albuterol  INHALE 2 PUFFS BY MOUTH EVERY 4 TO 6 HOURS AS NEEDED FOR WHEEZING.        Allergies  Allergen Reactions  . Neomycin Hives        Follow-up Information    Follow up with Angelena Form, PA-C.   Specialties:  Cardiology, Radiology   Why:  [facility] - 03/01/16 at 10:30am. Joellen Jersey is one of the PAs that works with Dr. Johnsie Cancel.   Contact information:   [address] [place] [address] [phone]       Follow up with [name], MD. Schedule  an appointment as soon as possible for a visit in 1 week.   Specialty:  Family Medicine   Contact information:   [address] [place] [address] [phone]       Follow up with Eilleen Kempf., MD. Schedule an appointment as soon as possible for a visit in 1 week.   Specialty:  Oncology   Contact information:   [address] [place] [address] [phone]       Follow up with Marshell Garfinkel, MD. Schedule an appointment as soon as possible for a visit in 1 week.   Specialty:  Pulmonary Disease   Contact information:   [address] [place] [address] [phone]        Major procedures and Radiology Reports - PLEASE review detailed and final reports for all details, in brief -   R .thoracentesis by PCCM 02-14-16 - 1lit borderline transudative/exudative fluid removed.   TTE  Left ventricle: There is diffuse hypokinesis and akinesis of the mid anteroseptal, aterior and anterolateral walls and apical anterior and septal walls. The cavity size was normal. There was mild concentric hypertrophy. Systolic function was moderately to severely reduced. The estimated ejection fraction was in the range of 30% to 35%. Features  are consistent with a pseudonormal left ventricular filling pattern, with concomitant abnormal relaxation and increased filling pressure (grade 2 diastolic dysfunction). Doppler parameters are consistent with elevated ventricular end-diastolic filling pressure. - Aortic valve: Trileaflet; normal thickness leaflets. There was no regurgitation. - Aortic root: The aortic root was normal in size. - Mitral valve: Structurally normal valve. There was mild regurgitation. - Left atrium: The atrium was mildly dilated. - Right ventricle: The cavity size was normal. Wall thickness was normal. Systolic function was normal. - Right atrium: The atrium was normal in size. - Tricuspid valve: There was mild regurgitation. - Pulmonic valve: There  was trivial regurgitation. - Pulmonary arteries: Systolic pressure was within the normal range. - Inferior vena cava: The vessel was normal in size. - Pericardium, extracardiac: There was no pericardial effusion.   CT Chest - Right-sided consolidation with bilateral pleural effusion, reduction in lung cancer and lymph node tumor burden.  Dg Chest 2 View  02/21/2016  CLINICAL DATA:  Stage IV lung malignancy status post chemotherapy and radiation; recent episode of pneumonia; increase shortness of breath and orthopnea. EXAM: CHEST  2 VIEW COMPARISON:  PA and lateral chest x-ray of February 16, 2016 FINDINGS: The right upper lobe density has become more confluent. The right lower lung is adequately inflated. There is a small right pleural effusion which is unchanged. The left lung is clear. There are post CABG changes. There is no cardiomegaly or pulmonary edema. The bony thorax exhibits no acute abnormality. IMPRESSION: Worsening of alveolar opacity in the right upper lobe which may reflect pneumonia either typical or postobstructive. Lymphangitic spread of malignancy could be present as well but is fell less likely than progressive pneumonia. There is no CHF. There is a small right pleural effusion. Electronically Signed   By: David  Martinique M.D.   On: 02/21/2016 11:02   Dg Chest 2 View  02/16/2016  CLINICAL DATA:  Shortness of breath with exertion, fever, cough EXAM: CHEST  2 VIEW COMPARISON:  Chest x-ray of 02/05/2016 FINDINGS: There is parenchymal opacity within the right upper lobe consistent with pneumonia. The left lung is clear. Mediastinal hilar contours are unremarkable. Followup PA and lateral chest X-ray is recommended in 3-4 weeks following trial of antibiotic therapy to ensure resolution and exclude underlying malignancy. The heart is within normal limits in size. No bony abnormality is seen. Median sternotomy sutures are noted from CABG. IMPRESSION: Right upper lobe opacity most consistent with  pneumonia. Followup PA and lateral chest X-ray is recommended in 3-4 weeks following trial of antibiotic therapy to ensure resolution and exclude underlying malignancy. Electronically Signed   By: Ivar Drape M.D.   On: 02/16/2016 15:19   Dg Chest 2 View  02/05/2016  CLINICAL DATA:  Acute onset of fever. Recently finished chemotherapy for right-sided lung cancer. Initial encounter. EXAM: CHEST  2 VIEW COMPARISON:  Chest radiograph performed 11/24/2015, and PET/CT performed 11/25/2015 FINDINGS: The lungs are well-aerated. New patchy right-sided airspace opacity raises concern for pneumonia. The known pulmonary mass is less well characterized on the current study. The left lung appears relatively clear. No pleural effusion or pneumothorax is seen. The heart is normal in size. The patient is status post median sternotomy, with evidence of prior CABG. No acute osseous abnormalities are seen. Scattered clips are noted about the upper abdomen. IMPRESSION: New patchy right-sided airspace opacity raises concern for pneumonia. Known pulmonary mass is less well characterized on the current study. Electronically Signed   By: Francoise Schaumann.D.  On: 02/05/2016 21:52   Ct Chest W Contrast  02/21/2016  CLINICAL DATA:  75 year old with current history of squamous cell carcinoma of the right upper lobe, treated with radiation therapy and chemotherapy, completed earlier this month. Patient was admitted 02/05/2016 with community acquired pneumonia. He presents again today with cough and fever. EXAM: CT CHEST WITH CONTRAST TECHNIQUE: Multidetector CT imaging of the chest was performed during intravenous contrast administration. CONTRAST:  58m ISOVUE-300 IOPAMIDOL (ISOVUE-300) INJECTION 61% IV. COMPARISON:  CT CT 11/25/2015.  CTA chest 11/11/2015, 03/10/2012. FINDINGS: Cardiovascular: Normal heart size. Extensive 3 vessel coronary atherosclerosis. Prior sternotomy for CABG. Visualized portions of the coronary grafts patent. No  pericardial effusion moderate to severe atherosclerosis involving the thoracic and upper abdominal aorta without aneurysm. Atherosclerosis involving the proximal great vessels without visible stenosis. Mediastinum/Lymph Nodes: Interval marked decrease in size of the pathologic low right paratracheal mediastinal lymph node (station 4R) since the PET-CT, current measurement approximating 1.1 x 1.3 cm (previously 2.0 x 1.8 cm). The hypermetabolic right axillary lymph node on the PET-CT is unchanged to perhaps slightly decreased in size. No new or enlarging lymphadenopathy. Small hiatal hernia. Thyroid gland unremarkable. Lungs/Pleura: Since the PET-CT, marked reduction in size of the right upper lobe lung mass, now measuring approximately 2.2 x 1.9 cm (series 5, image 37), previously 4.3 x 3.4 cm. Interval development of confluent airspace opacities with air bronchograms in the right upper lobe and in the superior segment right lower lobe, associated with a large right pleural effusion. Patchy airspace opacities are present medially in the left upper lobe. Small left pleural effusion. Upper abdomen: Surgically absent gallbladder. The he visualized proximal portion of the abdominal aortic stent graft is unremarkable. No acute findings. 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 No evidence of osseous metastatic disease. IMPRESSION: 1. Confluent airspace pneumonia involving the right upper lobe and the superior segment right lower lobe, with patchy pneumonia involving the left upper lobe. The right upper lobe pneumonia is superimposed upon post radiation changes. 2. Large right pleural effusion and small left pleural effusion. 3. Interval marked reduction in size of the right upper lobe lung cancer since the prior PET-CT 11/25/2015. Measurements are given above. 4. Interval reduction in size of the pathologic station 4R mediastinal lymph node since the prior PET-CT. No new or  enlarging lymphadenopathy. Electronically Signed   By: TEvangeline DakinM.D.   On: 02/21/2016 19:10   Ct Angio Chest Pe W Or Wo Contrast  02/24/2016  CLINICAL DATA:  75year old male with tachycardia EXAM: CT ANGIOGRAPHY CHEST WITH CONTRAST TECHNIQUE: Multidetector CT imaging of the chest was performed using the standard protocol during bolus administration of intravenous contrast. Multiplanar CT image reconstructions and MIPs were obtained to evaluate the vascular anatomy. CONTRAST:  100 mL Isovue 370 COMPARISON:  Chest x-ray 02/23/2016; recent CT scan of the chest 02/21/2016; PET-CT 11/25/2015 FINDINGS: Mediastinum: No mediastinal mass or suspicious adenopathy. Small hiatal hernia. Unremarkable visualized thyroid gland. Heart/Vascular: Adequate opacification of the pulmonary arteries to the proximal subsegmental level. No evidence of pulmonary embolus. Patient is status post coronary artery bypass grafting with a patent LIMA to distal LAD graft, and patent aortic to posterior descending coronary artery. A metallic stent is present in the left main coronary artery. The heart is normal in size. No pericardial effusion. No aortic aneurysm. Conventional 3 vessel arch anatomy. Lungs/Pleura: Persistent but improving right layering pleural effusion. Trace left effusion is also slightly smaller. Similar appearance of architectural distortion and  diffuse predominantly interstitial opacities throughout the right upper lobe and superior segment of the right lower lobe. Findings remain most consistent with changes of prior external beam radiation therapy. Similar changes are present in the medial aspect of the lingula. No new focal airspace consolidation, or mass. The soft tissue nodule in the medial aspect of the right upper lobe remains essentially unchanged at 2.0 x 1.8 cm. Bones/Soft Tissues: No acute fracture or aggressive appearing lytic or blastic osseous lesion. Upper Abdomen: Surgical changes of prior  cholecystectomy. Otherwise, no acute findings in the upper abdomen. Review of the MIP images confirms the above findings. IMPRESSION: 1. Negative for acute pulmonary embolus, pneumonia or other acute cardiopulmonary process. 2. Similar appearance of post radiation changes in the right upper lobe, superior segment of the right lower lobe and medial aspect of the left upper lobe. 3. Decreasing (compared to 02/21/2016) bilateral pleural effusions now small on the right and trace on the left. 4. Stable right upper lobe pulmonary nodule status post treatment with radiation therapy. Electronically Signed   By: Jacqulynn Cadet M.D.   On: 02/24/2016 14:46   Dg Chest Port 1 View  02/23/2016  CLINICAL DATA:  Shortness of breath. EXAM: PORTABLE CHEST 1 VIEW COMPARISON:  02/22/2016. FINDINGS: Prior CABG. Heart size normal. Right upper lobe infiltrate and atelectasis again noted. No interim change. Continued follow-up exam suggested demonstrate clearing . No pleural effusion or pneumothorax. IMPRESSION: 1.  Prior CABG.  Heart size normal. 2. Right upper lobe infiltrate and atelectasis again noted without significant change. Continued follow-up exams to demonstrate clearing suggested. Electronically Signed   By: Marcello Moores  Register   On: 02/23/2016 06:57   Dg Chest Port 1 View  02/22/2016  CLINICAL DATA:  Pleural effusion, shortness of Breath EXAM: PORTABLE CHEST 1 VIEW COMPARISON:  Chest x-ray and CT 02/21/2016 FINDINGS: Consolidation in the right upper lobe compatible with pneumonia previously seen effusion by CT not definitively visualized on this single view portable chest x-ray. No focal opacity on the left. Prior CABG. Heart is normal size. No acute bony abnormality. IMPRESSION: Continued consolidation in the right upper lobe most compatible with pneumonia. Previously seen pleural effusions cannot be visualized on this portable study. Electronically Signed   By: Rolm Baptise M.D.   On: 02/22/2016 11:53    Micro  Results    Recent Results (from the past 240 hour(s))  Body fluid culture     Status: None (Preliminary result)   Collection Time: 02/22/16 11:40 AM  Result Value Ref Range Status   Specimen Description PLEURAL RIGHT  Final   Special Requests NONE  Final   Gram Stain   Final    FEW WBC PRESENT, PREDOMINANTLY PMN NO ORGANISMS SEEN    Culture   Final    NO GROWTH 2 DAYS NO ANAEROBES ISOLATED; CULTURE IN PROGRESS FOR 5 DAYS Performed at [facility]    Report Status PENDING  Incomplete    Today   Subjective    Paul Sandoval today has no headache,no chest abdominal pain,no new weakness tingling or numbness, feels much better wants to go home today.   Objective   Blood pressure 116/54, pulse 96, temperature 97.7 F (36.5 C), temperature source Oral, resp. rate 18, height '5\' 10"'$  (1.778 m), weight 80 kg (176 lb 5.9 oz), SpO2 94 %.   Intake/Output Summary (Last 24 hours) at 02/25/16 0840 Last data filed at 02/25/16 0318  Gross per 24 hour  Intake   1080 ml  Output  2276 ml  Net  -1196 ml    Exam Awake Alert, Oriented x 3, No new F.N deficits, Normal affect [place].AT,PERRAL Supple Neck,No JVD, No cervical lymphadenopathy appriciated.  Symmetrical Chest wall movement, Good air movement bilaterally, CTAB RRR,No Gallops,Rubs or new Murmurs, No Parasternal Heave +ve B.Sounds, Abd Soft, Non tender, No organomegaly appriciated, No rebound -guarding or rigidity. No Cyanosis, Clubbing or edema, No new Rash or bruise   Data Review   CBC w Diff: Lab Results  Component Value Date   WBC 5.4 02/22/2016   WBC 7.1 02/21/2016   WBC 6.3 02/15/2016   HGB 8.1* 02/22/2016   HGB 8.8* 02/21/2016   HCT 24.9* 02/22/2016   HCT 27.6* 02/21/2016   HCT 26.9* 02/15/2016   PLT 134* 02/22/2016   PLT 126* 02/21/2016   PLT 138* 02/15/2016   LYMPHOPCT 9.7* 02/21/2016   LYMPHOPCT 20 02/05/2016   MONOPCT 10.5 02/21/2016   MONOPCT 27 02/05/2016   EOSPCT 2.5 02/21/2016   EOSPCT 1  02/05/2016   BASOPCT 0.3 02/21/2016   BASOPCT 1 02/05/2016    CMP: Lab Results  Component Value Date   NA 134* 02/24/2016   NA 134* 02/21/2016   NA 138 02/15/2016   K 4.2 02/24/2016   K 4.6 02/21/2016   CL 99* 02/24/2016   CO2 27 02/24/2016   CO2 28 02/21/2016   BUN 22* 02/24/2016   BUN 10.2 02/21/2016   BUN 13 02/15/2016   CREATININE 1.36* 02/24/2016   CREATININE 1.1 02/21/2016   CREATININE 1.63* 08/18/2014   PROT 7.2 02/22/2016   PROT 6.7 02/21/2016   PROT 7.1 08/23/2015   ALBUMIN 2.8* 02/22/2016   ALBUMIN 2.3* 02/21/2016   ALBUMIN 4.2 08/23/2015   BILITOT 0.45 02/21/2016   BILITOT 0.7 02/05/2016   BILITOT 0.6 08/23/2015   ALKPHOS 93 02/21/2016   ALKPHOS 68 02/05/2016   AST 22 02/21/2016   AST 25 02/05/2016   ALT 17 02/21/2016   ALT 19 02/05/2016  .   Total Time in preparing paper work, data evaluation and todays exam - 35 minutes  Thurnell Lose M.D on 02/25/2016 at 8:40 AM  [facility]   Office  [phone]

## 2016-02-26 LAB — BODY FLUID CULTURE: Culture: NO GROWTH

## 2016-02-27 ENCOUNTER — Telehealth: Payer: Self-pay | Admitting: Pulmonary Disease

## 2016-02-27 NOTE — Telephone Encounter (Signed)
Pt scheduled with SN on Wednesday at 11:30 that was open.  Nothing further needed.

## 2016-02-27 NOTE — Telephone Encounter (Signed)
Left message to call back  

## 2016-02-28 LAB — CHOLESTEROL, BODY FLUID: CHOL FL: 8 mg/dL

## 2016-02-29 ENCOUNTER — Ambulatory Visit (INDEPENDENT_AMBULATORY_CARE_PROVIDER_SITE_OTHER): Payer: Medicare Other | Admitting: Pulmonary Disease

## 2016-02-29 ENCOUNTER — Telehealth: Payer: Self-pay | Admitting: *Deleted

## 2016-02-29 ENCOUNTER — Ambulatory Visit (INDEPENDENT_AMBULATORY_CARE_PROVIDER_SITE_OTHER)
Admission: RE | Admit: 2016-02-29 | Discharge: 2016-02-29 | Disposition: A | Discharge status: Home / Self Care | Payer: Medicare Other | Source: Ambulatory Visit | Attending: Pulmonary Disease | Admitting: Pulmonary Disease

## 2016-02-29 ENCOUNTER — Other Ambulatory Visit (INDEPENDENT_AMBULATORY_CARE_PROVIDER_SITE_OTHER): Payer: Medicare Other

## 2016-02-29 ENCOUNTER — Other Ambulatory Visit: Payer: Self-pay | Admitting: *Deleted

## 2016-02-29 ENCOUNTER — Telehealth: Payer: Self-pay | Admitting: Cardiovascular Disease

## 2016-02-29 ENCOUNTER — Encounter: Payer: Self-pay | Admitting: Pulmonary Disease

## 2016-02-29 ENCOUNTER — Encounter: Payer: Self-pay | Admitting: Cardiovascular Disease

## 2016-02-29 VITALS — BP 132/70 | HR 85 | Temp 97.6°F | Ht 70.0 in | Wt 180.2 lb

## 2016-02-29 DIAGNOSIS — T451X5A Adverse effect of antineoplastic and immunosuppressive drugs, initial encounter: Secondary | ICD-10-CM

## 2016-02-29 DIAGNOSIS — C3411 Malignant neoplasm of upper lobe, right bronchus or lung: Secondary | ICD-10-CM

## 2016-02-29 DIAGNOSIS — N183 Chronic kidney disease, stage 3 unspecified: Secondary | ICD-10-CM

## 2016-02-29 DIAGNOSIS — J449 Chronic obstructive pulmonary disease, unspecified: Secondary | ICD-10-CM

## 2016-02-29 DIAGNOSIS — I714 Abdominal aortic aneurysm, without rupture, unspecified: Secondary | ICD-10-CM

## 2016-02-29 DIAGNOSIS — I255 Ischemic cardiomyopathy: Secondary | ICD-10-CM | POA: Diagnosis not present

## 2016-02-29 DIAGNOSIS — I5043 Acute on chronic combined systolic (congestive) and diastolic (congestive) heart failure: Secondary | ICD-10-CM

## 2016-02-29 DIAGNOSIS — I2510 Atherosclerotic heart disease of native coronary artery without angina pectoris: Secondary | ICD-10-CM

## 2016-02-29 DIAGNOSIS — J45909 Unspecified asthma, uncomplicated: Secondary | ICD-10-CM

## 2016-02-29 DIAGNOSIS — D696 Thrombocytopenia, unspecified: Secondary | ICD-10-CM

## 2016-02-29 DIAGNOSIS — J189 Pneumonia, unspecified organism: Secondary | ICD-10-CM | POA: Diagnosis not present

## 2016-02-29 DIAGNOSIS — I679 Cerebrovascular disease, unspecified: Secondary | ICD-10-CM

## 2016-02-29 DIAGNOSIS — D6481 Anemia due to antineoplastic chemotherapy: Secondary | ICD-10-CM

## 2016-02-29 DIAGNOSIS — J4489 Other specified chronic obstructive pulmonary disease: Secondary | ICD-10-CM

## 2016-02-29 LAB — SEDIMENTATION RATE: SED RATE: 53 mm/h — AB (ref 0–20)

## 2016-02-29 LAB — COMPREHENSIVE METABOLIC PANEL
ALBUMIN: 3.6 g/dL (ref 3.5–5.2)
ALK PHOS: 85 U/L (ref 39–117)
ALT: 33 U/L (ref 0–53)
AST: 22 U/L (ref 0–37)
BUN: 30 mg/dL — ABNORMAL HIGH (ref 6–23)
CALCIUM: 9.6 mg/dL (ref 8.4–10.5)
CO2: 36 mEq/L — ABNORMAL HIGH (ref 19–32)
Chloride: 96 mEq/L (ref 96–112)
Creatinine, Ser: 1.4 mg/dL (ref 0.40–1.50)
GFR: 52.34 mL/min — AB (ref >60.00)
GLUCOSE: 120 mg/dL — AB (ref 70–99)
POTASSIUM: 5 meq/L (ref 3.5–5.1)
Sodium: 138 mEq/L (ref 135–145)
TOTAL PROTEIN: 7.4 g/dL (ref 6.0–8.3)
Total Bilirubin: 0.5 mg/dL (ref 0.2–1.2)

## 2016-02-29 LAB — CBC WITH DIFFERENTIAL/PLATELET
BASOS ABS: 0 10*3/uL (ref 0.0–0.1)
Basophils Relative: 0 % (ref 0.0–3.0)
EOS ABS: 0 10*3/uL (ref 0.0–0.7)
EOS PCT: 0 % (ref 0.0–5.0)
HCT: 33.5 % — ABNORMAL LOW (ref 39.0–52.0)
HEMOGLOBIN: 10.9 g/dL — AB (ref 13.0–17.0)
Lymphocytes Relative: 3.5 % — ABNORMAL LOW (ref 12.0–46.0)
Lymphs Abs: 0.6 10*3/uL — ABNORMAL LOW (ref 0.7–4.0)
MCHC: 32.7 g/dL (ref 30.0–36.0)
MCV: 89 fl (ref 78.0–100.0)
MONO ABS: 0.5 10*3/uL (ref 0.1–1.0)
Monocytes Relative: 2.8 % — ABNORMAL LOW (ref 3.0–12.0)
Neutro Abs: 17 10*3/uL — ABNORMAL HIGH (ref 1.4–7.7)
Neutrophils Relative %: 93.7 % — ABNORMAL HIGH (ref 43.0–77.0)
Platelets: 249 10*3/uL (ref 150.0–400.0)
RBC: 3.76 Mil/uL — AB (ref 4.22–5.81)
RDW: 20.6 % — ABNORMAL HIGH (ref 11.5–15.5)

## 2016-02-29 LAB — BRAIN NATRIURETIC PEPTIDE: PRO B NATRI PEPTIDE: 1053 pg/mL — AB (ref 0.0–100.0)

## 2016-02-29 MED ORDER — ACETAZOLAMIDE ER 500 MG PO CP12: 500.0000 mg | ORAL_CAPSULE | Freq: Every day | ORAL | Status: DC

## 2016-02-29 NOTE — Telephone Encounter (Signed)
Looks like scheduling called patient to remind him of his appointment tomorrow. Called patient back to let him know. Patient states that he thinks someone was calling about a test. Patient stated he will just wait for his appointment tomorrow.

## 2016-02-29 NOTE — Telephone Encounter (Signed)
error 

## 2016-02-29 NOTE — Telephone Encounter (Signed)
Follow Up:  Pt was returning call,he said somebody had called 2 times today. He did not have a name of who called.

## 2016-02-29 NOTE — Progress Notes (Signed)
Subjective:     Patient ID: Paul Sandoval, male   DOB: Nov 02, 1940, 75 y.o.   MRN: [id number]  HPI ~  February 29, 2016:  [place] w/ SN>  Paul Sandoval is a 75 y/o WF w/ mult medical problems as noted below who has seen DrAlva & DrMannam previously;  I have reviewed his extensive epic records and recent [place] x2 in June2017 and formulated the following PROBLEM LIST>>  His PCP is DrLuking in [place]... He reminded me that I cared for his dad in 61...    Non-small cell lung cancer, Stage IIIA, diagnosed 10/2015 & treated by DrMohamed & DrManning w/ Chemoradiation (carboplatin & paclitaxel)     ~  Bronch 11/17/15 by Kallie Edward showed no endobronchial lesions- TBBx were neg, but brushing & washings were pos for malig cells (felt to be c/w SqCellCa)    ~  He received ChemoRx from DrMohamed with weekly carboplatin and paclitaxel status post 4 cycles finished 01/24/16 (dose held on several occas due to side effects)    ~  He received XRT 4/17 - 01/26/16 by DrManning to the primary tumor & involved mediastinal adenopathy w/ 66 Gy (33 fractions of 2Gy each); he had mod esophagitis 7 some fatigue    Abnormal CXR w/ RUL mass, s/p treatment as above, & subseq RUL opac (?infection vs radiation fibrosis changes)>  He just finished antibiotic Rx & currently taking a PREDNISONE taper (30-25-20-15-10-5 Q5d over 63mo per DrManning...    ~  HS[facility] Diegox2 in 01/2016>  Fever, cough, increased RUL opac (PCT<0.10, he received 10d Levaquin) & right>left effusion w/ thoracentesis 02/22/16 removing 1200cc clear yellow fluid, prob transudate, & BNP was >900; meds adjusted & he was diuresed w/ improvement but 2DEcho revealed worsening CHF w/ EF ~30-35% + HK & AK => he has f/u pending w/ Cards...    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    ASPVD- s/p AAA stent graft 11/2012, mod bilat carotid occlusive dis (asymptomatic) w/ CDopplers showing ~50-60% bilat ICAstenoses>  [facility] plans yearly f/u CT Abd and CDopplers    MEDICAL issues>  HBP, HL, GERD/ Divertic/ Polyps, CKD-stage3, Anemia & thrombocytopenia>  On Simva80-1/2Qd, Prilosec20/d, Fe/ MVI/ etc...  Since he was disch 02/25/16>  He reports feeling better, breathing better, still sl SOB w/ activ but not requiring O2, mild cough w/ beige sput, no hemoptysis, demies CP=> he is due for f/u CXR & blood work post hosp... EXAM shows Afeb, VSS, O2sat=95% on RA after walking back; Wt=180#;  HEENT- neg, mallampati2;  Chest- sl decr BS right base, few rhonchi, w/p w/r/consolidation;  Heart- RR Gr1/6SEM w/o r/g;  Abd- soft, neg;  Ext- neg w/o c/c/e...  PFT 02/01/16>  FVC=1.96 (45%), FEV1=1.25 (40%), %1sec=64, mid-flows were reduced at 28% predicted; post bronchodil there was a 31% improvement in FEV1 to 1.64;  TLC=5.96 (83%, RV=3.89 (  149%), RV/TLC=65%;  DLCO=58% pred.  This is c/w moderate to severe airflow obstruction, GOLD Stage 3 COPD w/ a signif asthmatic component, air trapping, and decr diffusion capacity...  2DEcho 02/22/16>  Mild conc LVH w/ decr LVF & EF=30-35% w/ diffuse HK & AK in several walls (see report), Gr2 DD, mild MR otherw norm valves, mild LAdil (68m), norm RV function & PA pressures  LABS 01/2016 Hosp>  Chems- ok x Cr=1.3-1.4, BS=120-140, Alb=2.8;  CBC- anemic w/ Hg=8-9 range, Plat~130K range;  TSH=0.92  CT Chest 02/21/16>  Norm heart size, extensive coronary calcif, s/p CABG, decr size of the pathological  right paratracheal LN, decr size of RUL mass (now ~2cm & prev ~4cm), new confluent airsp opac w/ air bronchograms in RUL w/ large right effusion; s/p GB, +HH, Abd Ao stent graft, DJD & DISH w/o metastatic lesions  CT Angio Chest 02/24/16>  NEG for PE, s/pCABG & stent in [place], norm heart size, no pericard fluid, small right pleural effusion, architectural distortion & interstitial opacities in RUL c/w XRT changes, RUL nodule measures ~2cm, no adenopathy reported...   CXR 02/29/16>  Stable heart size, tortuous Ao, s/p CABG, sl improvement in interstitial opac in RUL area & posteromedial RUL nodule  LABS 02/29/16>  Chems- ok x HCO3=36, BS=120, BUN/Cr=30/1.40;  CBC- Hg=10.9, Plat=249K, WBC=18.2 (on Pred);  BNP=1053;  Sed=53  IMP/PLAN>>  Problem list as above w/ signif cardiac, pulmonary, & post chemoradiation changes;  From the pulm standpoint- he feels the BREO is helping & hasn't needed the rescue inhaler very often, continue same;  He is on a long PRED taper per DrManning for poss radiation fibrosis RUL, Sed rate is 53, continue this taper & careful w/ sweets/ sugar etc;  From the cardiac standpoint- he has signif underlying dis w/ Lmain stent, s/p CABG 20 yrs ago, prob ischemic cardiomyopathy w/ worsening 2DEcho recently w/ 30-35% EF + HK&AK seen, BNP=1053, Cards plans ischemic work up when able, in the meanwhile he has improved w/ diuresis but Chems w/ mild RI & HCO3=36... REC- add DIAMOX-ER 500 one tab daily at 4pm, continue his Lasix40 Qam, increase water intake, watch weights... We plan ROV 176monthecheck...    Past Medical History  Diagnosis Date  . 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 AAA (abdominal aortic aneurysm) ([facility]    4.4 cm 08/2008;4.44 in 7/10 and 4.65 in 08/2009; 4.8 by CT in 11/2009; 4.3 by ultrasound in 08/2010  . Hypertension   . Hyperlipidemia   . Tobacco abuse, in remission     40 pack year total consumption; discontinued in 1996  . GERD  (gastroesophageal reflux disease)   . Right bundle branch block   . Diverticulosis   . Colonic polyp 2002    polypectomy in 2002  . COPD (chronic obstructive pulmonary disease) ([facility]  . CAD (coronary artery disease)     03/18/14:  PCI with DES to distal left main. 7/29: DES to the SVG to Diag  . ED (erectile dysfunction)   . IFG (impaired fasting glucose)   . Chronic rhinitis   . [facility] ([facility]    "told h/o silent MI sometime before 1996"  . Pneumonia ~ 2001; ~ 2005  . Chronic bronchitis ([facility]  . Arthritis     "fingers" (03/18/2014)  . Cardiomyopathy, ischemic     Echo 03/17/14: EF 45-50%  . Encounter for antineoplastic chemotherapy 12/19/2015  . Cancer ([facility]    Upper right lobe  lung cancer    Past Surgical History  Procedure Laterality Date  . Colonoscopy  2002    polypectomy-patient denies  . Laparoscopic cholecystectomy  12/2009  . Abdominal aortic endovascular stent graft N/A 12/11/2012    Procedure: ABDOMINAL AORTIC ENDOVASCULAR STENT GRAFT;  Surgeon: Mal Misty, MD;  Location: [place];  Service: Vascular;  Laterality: N/A;  Ultrasound guided; [place]  . Abdominal aortic aneurysm repair  11/2012  . Total hip arthroplasty Left 01/21/2013    Procedure: TOTAL HIP ARTHROPLASTY ANTERIOR APPROACH;  Surgeon: Mauri Pole, MD;  Location: [place];  Service: Orthopedics;  Laterality: Left;  . Joint replacement    . Coronary artery bypass graft  01/09/1995    "CABG X3"  . Cardiac catheterization  01/08/1995  . Coronary angioplasty with stent placement  03/18/2014    "1"  . Coronary angioplasty with stent placement  03/24/2014    "1"  . Left and right heart catheterization with coronary/graft angiogram N/A 03/18/2014    Procedure: LEFT AND RIGHT HEART CATHETERIZATION WITH Beatrix Fetters;  Surgeon: Blane Ohara, MD;  Location: [facility] CATH LAB;  Service: Cardiovascular;  Laterality: N/A;  . Percutaneous coronary stent intervention (pci-s)  03/18/2014    Procedure:  PERCUTANEOUS CORONARY STENT INTERVENTION (PCI-S);  Surgeon: Blane Ohara, MD;  Location[facility] LAB;  Service: Cardiovascular;;  . Percutaneous coronary stent intervention (pci-s) N/A 03/24/2014    Procedure: PERCUTANEOUS CORONARY STENT INTERVENTION (PCI-S);  Surgeon: Blane Ohara, MD;  Location: [facility] CATH LAB;  Service: Cardiovascular;  Laterality: N/A;  . Video bronchoscopy N/A 11/17/2015    Procedure: VIDEO BRONCHOSCOPY WITH FLUORO;  Surgeon: Rigoberto Noel, MD;  Location: [place];  Service: Cardiopulmonary;  Laterality: N/A;    Outpatient Encounter Prescriptions as of 02/29/2016  Medication Sig  . acetaminophen (TYLENOL) 500 MG tablet Take 500 mg by mouth every 6 (six) hours as needed for mild pain.  Marland Kitchen aspirin EC 81 MG tablet Take 81 mg by mouth daily.  . clopidogrel (PLAVIX) 75 MG tablet Take 1 tablet (75 mg total) by mouth daily. Reported on 12/02/2015  . Ferrous Sulfate (IRON) 28 MG TABS Take 28 mg by mouth daily.  . fluticasone furoate-vilanterol (BREO ELLIPTA) 100-25 MCG/INH AEPB Inhale 1 puff into the lungs daily.  . furosemide (LASIX) 40 MG tablet Take 1 tablet (40 mg total) by mouth daily.  . metoprolol 75 MG TABS Take 75 mg by mouth 2 (two) times daily. Dispense as needed 1 month supply only  . Multiple Vitamins-Minerals (CENTRUM SILVER ADULT 50+) TABS Take 1 tablet by mouth daily.   . nitroGLYCERIN (NITROSTAT) 0.4 MG SL tablet Place 1 tablet (0.4 mg total) under the tongue every 5 (five) minutes as needed for chest pain.  Marland Kitchen omeprazole (PRILOSEC) 20 MG capsule TAKE ONE CAPSULE BY MOUTH DAILY. (Patient taking differently: TAKE 20 MG BY MOUTH DAILY.)  . predniSONE (DELTASONE) 5 MG tablet Label  & dispense according to the schedule below. 0 refills. Take '60mg'$  PO daily x 5 days, then '50mg'$  x 5 days, then '40mg'$  x 5 days, then '30mg'$  x 5 days, then '20mg'$  x 5 days, then '10mg'$  x 5 days, then '5mg'$  x 5 days, then 2.'5mg'$  x 5 days, then 1 mg x 5 days, then 0.'5mg'$  x 5 days and stop.  . simvastatin  (ZOCOR) 80 MG tablet Take 0.5 tablets (40 mg total) by mouth at bedtime.  . VENTOLIN HFA 108 (90 Base) MCG/ACT inhaler INHALE 2 PUFFS BY MOUTH EVERY 4  TO 6 HOURS AS NEEDED FOR WHEEZING.  . [DISCONTINUED] docusate sodium (COLACE) 100 MG capsule Take 100 mg by mouth daily as needed for mild constipation or moderate constipation. Reported on 02/29/2016  . [DISCONTINUED] prochlorperazine (COMPAZINE) 10 MG tablet TAKE (1) TABLET BY MOUTH EVERY SIX HOURS AS NEEDED FOR NAUSEA AND VOMITING. (Patient not taking: Reported on 02/29/2016)  . [DISCONTINUED] sodium chloride 1 g tablet Take 2 tablets (2 g total) by mouth 2 (two) times daily with a meal. (Patient not taking: Reported on 02/21/2016)  . [DISCONTINUED] sucralfate (CARAFATE) 1 g tablet Dissolve in 8 oz water and drink 5 minutes prior to meals and at bedtime (Patient not taking: Reported on 02/21/2016)   No facility-administered encounter medications on file as of 02/29/2016.    Allergies  Allergen Reactions  . Neomycin Hives    Immunization History  Administered Date(s) Administered  . H1N1 08/06/2008  . Influenza Split 06/24/2013  . Influenza,inj,Quad PF,36+ Mos 06/15/2014, 05/31/2015  . Influenza-Unspecified 05/27/2012  . Pneumococcal Conjugate-13 08/18/2014  . Pneumococcal Polysaccharide-23 03/27/2012  . Zoster 07/27/2008    Current Medications, Allergies, Past Medical History, Past Surgical History, Family History, and Social History were reviewed in [employer] record.   Review of Systems             All symptoms NEG except where BOLDED >>  Constitutional:  F/C/S, fatigue, anorexia, unexpected weight change. HEENT:  HA, visual changes, hearing loss, earache, nasal symptoms, sore throat, mouth sores, hoarseness. Resp:  cough, sputum, hemoptysis; SOB, tightness, wheezing. Cardio:  CP, palpit, DOE, orthopnea, edema. GI:  N/V/D/C, blood in stool; reflux, abd pain, distention, gas. GU:  dysuria, freq, urgency,  hematuria, flank pain, voiding difficulty. MS:  joint pain, swelling, tenderness, decr ROM; neck pain, back pain, etc. Neuro:  HA, tremors, seizures, dizziness, syncope, weakness, numbness, gait abn. Skin:  suspicious lesions or skin rash. Heme:  adenopathy, bruising, bleeding. Psyche:  confusion, agitation, sleep disturbance, hallucinations, anxiety, depression suicidal.   Objective:   Physical Exam       Vital Signs:  Reviewed...  General:  WD, WN, 75 y/o WM in NAD; alert & oriented; pleasant & cooperative... HEENT:  [place]/AT; Conjunctiva- pink, Sclera- nonicteric, EOM-wnl, PERRLA, EACs-clear, TMs-wnl; NOSE-clear; THROAT-clear & wnl. Neck:  Supple w/ fair ROM; no JVD; normal carotid impulses w/ faint bruits; no thyromegaly or nodules palpated; no lymphadenopathy. Chest:  decr BS right base w/ few rhonchi, no w/r/signs of consolidation Heart:  Regular Rhythm; norm S1 & S2 w/ Gr1/6 SEM without rubs or gallops detected. Abdomen:  Soft & nontender- no guarding or rebound; normal bowel sounds; no organomegaly or masses palpated. Ext:  Sl decr ROM; without deformities +arthritic changes; no varicose veins, +venous insuffic, tr edema;  Pulses decr w/o bruits. Neuro:  CNs II-XII intact; motor testing normal; sensory testing normal; gait normal & balance OK. Derm:  No lesions noted; no rash etc. Lymph:  No cervical, supraclavicular, axillary, or inguinal adenopathy palpated.   Assessment:      IMP>>      Non-small cell lung cancer, Stage IIIA, diagnosed 10/2015 & treated by DrMohamed & DrManning w/ Chemoradiation (carboplatin & paclitaxel)     Abnormal CXR w/ RUL mass, s/p treatment as above, & subseq RUL opac (?infection vs radiation fibrosis changes) w/ assoc right pleural effusion>      COPD, former smoker (quit 1996 w/ ~40+ pack-yr hx)>  On BREO one inhalation daily, VentolinHFA rescue inhaler as needed    CAD, s/pCABG  1996, RBBB, ischemic cardiomyopathy w/ EF=30-35% 01/2016, acute on chronic  systolic & diastolic CHF, R>>L pleural effusion after salt tabs for hyponatremia>  On ASA81, Plavix75, Metop50-1.5TabsBid, Lasix40.     ASPVD- s/p AAA stent graft 11/2012, mod bilat carotid occlusive dis (asymptomatic) w/ CDopplers showing ~50-60% bilat ICAstenoses>  Stable & seen by [facility] 01/2016- plans yearly f/u CT Abd and CDopplers    MEDICAL issues>  HBP, HL, GERD/ Divertic/ Polyps, CKD-stage3, Anemia & thrombocytopenia>  On Simva80-1/2Qd, Prilosec20/d, Fe/ MVI/ etc...  PLAN>>     Problem list as above w/ signif cardiac, pulmonary, & post chemoradiation changes;  From the pulm standpoint- he feels the BREO is helping & hasn't needed the rescue inhaler very often, continue same;  He is on a long PRED taper per DrManning for poss radiation fibrosis RUL, Sed rate is 53, continue this taper & careful w/ sweets/ sugar etc;  From the cardiac standpoint- he has signif underlying dis w/ Lmain stent, s/p CABG 20 yrs ago, prob ischemic cardiomyopathy w/ worsening 2DEcho recently w/ 30-35% EF + HK&AK seen, BNP=1053, Cards plans ischemic work up when able, in the meanwhile he has improved w/ diuresis but Chems w/ mild RI & HCO3=36... REC- add DIAMOX-ER 500 one tab daily at 4pm, continue his Lasix40 Qam, increase water intake, watch weights... We plan ROV 74monthrecheck...     Plan:     Patient's Medications  New Prescriptions   ACETAZOLAMIDE (DIAMOX SEQUELS) 500 MG CAPSULE    Take 1 capsule (500 mg total) by mouth daily. At 4pm  Previous Medications   ACETAMINOPHEN (TYLENOL) 500 MG TABLET    Take 500 mg by mouth every 6 (six) hours as needed for mild pain.   ASPIRIN EC 81 MG TABLET    Take 81 mg by mouth daily.   CLOPIDOGREL (PLAVIX) 75 MG TABLET    Take 1 tablet (75 mg total) by mouth daily. Reported on 12/02/2015   FERROUS SULFATE (IRON) 28 MG TABS    Take 28 mg by mouth daily.   FLUTICASONE FUROATE-VILANTEROL (BREO ELLIPTA) 100-25 MCG/INH AEPB    Inhale 1 puff into the lungs daily.   FUROSEMIDE (LASIX)  40 MG TABLET    Take 1 tablet (40 mg total) by mouth daily.   METOPROLOL 75 MG TABS    Take 75 mg by mouth 2 (two) times daily. Dispense as needed 1 month supply only   MULTIPLE VITAMINS-MINERALS (CENTRUM SILVER ADULT 50+) TABS    Take 1 tablet by mouth daily.    NITROGLYCERIN (NITROSTAT) 0.4 MG SL TABLET    Place 1 tablet (0.4 mg total) under the tongue every 5 (five) minutes as needed for chest pain.   OMEPRAZOLE (PRILOSEC) 20 MG CAPSULE    TAKE ONE CAPSULE BY MOUTH DAILY.   PREDNISONE (DELTASONE) 5 MG TABLET    Label  & dispense according to the schedule below. 0 refills. Take '60mg'$  PO daily x 5 days, then '50mg'$  x 5 days, then '40mg'$  x 5 days, then '30mg'$  x 5 days, then '20mg'$  x 5 days, then '10mg'$  x 5 days, then '5mg'$  x 5 days, then 2.'5mg'$  x 5 days, then 1 mg x 5 days, then 0.'5mg'$  x 5 days and stop.   SIMVASTATIN (ZOCOR) 80 MG TABLET    Take 0.5 tablets (40 mg total) by mouth at bedtime.   VENTOLIN HFA 108 (90 BASE) MCG/ACT INHALER    INHALE 2 PUFFS BY MOUTH EVERY 4 TO 6 HOURS AS NEEDED FOR WHEEZING.  Modified Medications   No medications on file  Discontinued Medications   DOCUSATE SODIUM (COLACE) 100 MG CAPSULE    Take 100 mg by mouth daily as needed for mild constipation or moderate constipation. Reported on 02/29/2016   PROCHLORPERAZINE (COMPAZINE) 10 MG TABLET    TAKE (1) TABLET BY MOUTH EVERY SIX HOURS AS NEEDED FOR NAUSEA AND VOMITING.   SODIUM CHLORIDE 1 G TABLET    Take 2 tablets (2 g total) by mouth 2 (two) times daily with a meal.   SUCRALFATE (CARAFATE) 1 G TABLET    Dissolve in 8 oz water and drink 5 minutes prior to meals and at bedtime

## 2016-02-29 NOTE — Telephone Encounter (Signed)
LMTCB

## 2016-02-29 NOTE — Patient Instructions (Signed)
Lain-- it was great meeting you (again) today!  Continue your same meds for now...  Today we rechecked a follow up CXR, and blood work...    We will contact you w/ the results when available...   Keep your follow up appts as scheduled...  Call for any questions...  Let's plan a follow up visit in 52mo sooner if needed for any breathing problems..Marland KitchenMarland Kitchen

## 2016-03-01 ENCOUNTER — Encounter: Payer: Self-pay | Admitting: Physician Assistant

## 2016-03-01 ENCOUNTER — Ambulatory Visit (INDEPENDENT_AMBULATORY_CARE_PROVIDER_SITE_OTHER): Payer: Medicare Other | Admitting: Physician Assistant

## 2016-03-01 VITALS — BP 130/70 | HR 91 | Ht 70.0 in | Wt 179.1 lb

## 2016-03-01 DIAGNOSIS — I519 Heart disease, unspecified: Secondary | ICD-10-CM | POA: Diagnosis not present

## 2016-03-01 NOTE — Telephone Encounter (Signed)
To be seen by Bennett Scrape today for post hospital follow up.

## 2016-03-01 NOTE — Progress Notes (Signed)
Cardiology Office Note    Date:  03/01/2016   ID:  Paul Sandoval, DOB June 06, 1941, MRN [id number]  PCP:  [name], MD  Cardiologist:  Dr. Johnsie Cancel  CC: post hospital follow up  History of Present Illness:   52M with CAD (s/p CABG 1996, [facility] 02/2014 s/p DES to dLM & staged PCI of SVG-diag), carotid disease and aortic stent graft followed by Dr. 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  In 02/2014 he underwent planned diagnostic left heart catheterization, revealing severe three-vessel coronary artery disease and total occlusion of the RCA, total occlusion of the LAD, severe ulcerative stenosis of the distal left main, and severe diffuse stenosis of the OM left circumflex branches. There was continued patency of the saphenous vein graft to his PDA LIMA to LAD and patent but severely diseased saphenous vein graft to the diagonal. The patient underwent PCI of the distal left main with drug-eluting stent. A staged PCI of the saphenous vein graft to the diagonal 02/2014. Last seen by Dr. Johnsie Cancel 03/21/15. That time he was walking 2 miles every day and playing golf. No anginal pain.  He was diagnosed with a lung cancer in 10/2015 and completed radiation + carboplatin/paclitaxel therapy x 4 in early June. He was admitted 02/05/16-02/08/16 for community-acquired pneumonia. He also had acute on chronic kidney disease and hyponatremia with sodium 125. His hydrochlorothiazide was stopped and started on sodium tablets. His Enalapril 10 mg was discontinued at that time.  Since then he continued to have cough and intermittent low-grade fever. He then noted progressive worsening of lower extremity edema and orthopnea. He was seen in [facility] 02/21/16 and complaining of increased shortness of breath, fatigue  and cough with temperature of 100.1. 7 admitted directly to [facility] for further evaluation where workup revealed worsening pneumonia on RUL. CT showed pneumonia and R pleural effusion s/p t thoracocentesis with 1150 ml of clear yellow pleural fluid was obtained. His CT scan findings were reviewed by the radiation oncologist Dr. Tammi Klippel who recommended 30 day prednisone taper as his CT scan findings could be related to radiation-induced lung injury.They then felt like he probably did not have PNA and held Abx and started on prednisone as recommended.   Due to concern for acute heart failure BNP was obtained which showed elevation >1000. Echo done during this admission showed reduction in his left ventricular function to 95-63%, grade 2 diastolic dysfunction, elevated ventricular end-diastolic filling pressure (EF of 45-50% 03/17/14). He was diuresed and outpatient myoview was recommended. His troponin was noted to be elevated but c/w demand ischemia in the setting of acute CHF. His home HCTZ was discontinued and he was started on Lasix '40mg'$  daily.   He saw Dr. Lenna Gilford with PCCM yesterday and repeat CXR on 02/29/16 showed "stable right upper lobe postradiation changes and/or infiltrate. Tiny bilateral pleural effusions cannot be excluded." Labs showed improving H/H to 10.9/33.5. BNP was still elevated with elevated CO2 and he was started on Diamox. He was continued on lasix '40mg'$  daily.   Today he presents to clinic for follow up. No dizziness or syncope. He has been feeling much better. No LE edema, orthopnea or PND. No chest pain. No dizziness or syncope. No blood in his stool or urine. Just so much happier to be  out of the hospital. He thinks this was all caused by the sodium tablets he was given during his first admission.    Past Medical History  Diagnosis Date  . 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 AAA (abdominal aortic aneurysm) ([place]) 2010    4.4 cm  08/2008;4.44 in 7/10 and 4.65 in 08/2009; 4.8 by CT in 11/2009; 4.3 by ultrasound in 08/2010  . Hypertension   . Hyperlipidemia   . Tobacco abuse, in remission     40 pack year total consumption; discontinued in 1996  . GERD (gastroesophageal reflux disease)   . Right bundle branch block   . Diverticulosis   . Colonic polyp 2002    polypectomy in 2002  . COPD (chronic obstructive pulmonary disease) ([place])   . CAD (coronary artery disease)     03/18/14:  PCI with DES to distal left main. 7/29: DES to the SVG to Diag  . ED (erectile dysfunction)   . IFG (impaired fasting glucose)   . Chronic rhinitis   . Myocardial infarction [facility])     "told h/o silent MI sometime before 1996"  . Pneumonia ~ 2001; ~ 2005  . Chronic bronchitis ([place])   . Arthritis     "fingers" (03/18/2014)  . Cardiomyopathy, ischemic     Echo 03/17/14: EF 45-50%  . Encounter for antineoplastic chemotherapy 12/19/2015  . Cancer [facility])     Upper right lobe lung cancer    Past Surgical History  Procedure Laterality Date  . Colonoscopy  2002    polypectomy-patient denies  . Laparoscopic cholecystectomy  12/2009  . Abdominal aortic endovascular stent graft N/A 12/11/2012    Procedure: ABDOMINAL AORTIC ENDOVASCULAR STENT GRAFT;  Surgeon: Mal Misty, MD;  Location: [place];  Service: Vascular;  Laterality: N/A;  Ultrasound guided; [place]  . Abdominal aortic aneurysm repair  11/2012  . Total hip arthroplasty Left 01/21/2013    Procedure: TOTAL HIP ARTHROPLASTY ANTERIOR APPROACH;  Surgeon: Mauri Pole, MD;  Location: [place];  Service: Orthopedics;  Laterality: Left;  . Joint replacement    . Coronary artery bypass graft  01/09/1995    "CABG X3"  . Cardiac catheterization  01/08/1995  . Coronary angioplasty with stent placement  03/18/2014    "1"  . Coronary angioplasty with stent placement  03/24/2014    "1"  . Left and right heart catheterization with coronary/graft angiogram N/A 03/18/2014    Procedure: LEFT AND RIGHT HEART  CATHETERIZATION WITH Beatrix Fetters;  Surgeon: Blane Ohara, MD;  Location: [facility] C[facility] LAB;  Service: Cardiovascular;  Laterality: N/A;  . Percutaneous coronary stent intervention (pci-s)  03/18/2014    Procedure: PERCUTANEOUS CORONARY STENT INTERVENTION (PCI-S);  Surgeon: Blane Ohara, MD;  Location: [facility] CATH LAB;  Service: Cardiovascular;;  . Percutaneous coronary stent intervention (pci-s) N/A 03/24/2014    Procedure: PERCUTANEOUS CORONARY STENT INTERVENTION (PCI-S);  Surgeon: Blane Ohara, MD;  Location: [facility] CATH LAB;  Service: Cardiovascular;  Laterality: N/A;  . Video bronchoscopy N/A 11/17/2015    Procedure: VIDEO BRONCHOSCOPY WITH FLUORO;  Surgeon: Rigoberto Noel, MD;  Location: [place];  Service: Cardiopulmonary;  Laterality: N/A;    Current Medications: Outpatient Prescriptions Prior to Visit  Medication Sig Dispense Refill  . acetaminophen (TYLENOL) 500 MG tablet Take 500 mg by mouth every 6 (six) hours as needed for mild pain.    Marland Kitchen acetaZOLAMIDE (DIAMOX SEQUELS) 500 MG capsule Take 1 capsule (500 mg total) by mouth daily. At  4pm 30 capsule 3  . aspirin EC 81 MG tablet Take 81 mg by mouth daily.    . clopidogrel (PLAVIX) 75 MG tablet Take 1 tablet (75 mg total) by mouth daily. Reported on 12/02/2015 90 tablet 3  . Ferrous Sulfate (IRON) 28 MG TABS Take 28 mg by mouth daily.    . fluticasone furoate-vilanterol (BREO ELLIPTA) 100-25 MCG/INH AEPB Inhale 1 puff into the lungs daily. 60 each 5  . furosemide (LASIX) 40 MG tablet Take 1 tablet (40 mg total) by mouth daily. 30 tablet 0  . metoprolol 75 MG TABS Take 75 mg by mouth 2 (two) times daily. Dispense as needed 1 month supply only 30 tablet 0  . Multiple Vitamins-Minerals (CENTRUM SILVER ADULT 50+) TABS Take 1 tablet by mouth daily.     . nitroGLYCERIN (NITROSTAT) 0.4 MG SL tablet Place 1 tablet (0.4 mg total) under the tongue every 5 (five) minutes as needed for chest pain. 25 tablet 4  . omeprazole (PRILOSEC) 20 MG  capsule TAKE ONE CAPSULE BY MOUTH DAILY. (Patient taking differently: TAKE 20 MG BY MOUTH DAILY.) 90 capsule 1  . predniSONE (DELTASONE) 5 MG tablet Label  & dispense according to the schedule below. 0 refills. Take '60mg'$  PO daily x 5 days, then '50mg'$  x 5 days, then '40mg'$  x 5 days, then '30mg'$  x 5 days, then '20mg'$  x 5 days, then '10mg'$  x 5 days, then '5mg'$  x 5 days, then 2.'5mg'$  x 5 days, then 1 mg x 5 days, then 0.'5mg'$  x 5 days and stop. 100 tablet 0  . simvastatin (ZOCOR) 80 MG tablet Take 0.5 tablets (40 mg total) by mouth at bedtime. 15 tablet 5  . VENTOLIN HFA 108 (90 Base) MCG/ACT inhaler INHALE 2 PUFFS BY MOUTH EVERY 4 TO 6 HOURS AS NEEDED FOR WHEEZING. 18 g 5   No facility-administered medications prior to visit.     Allergies:   Neomycin   Social History   Social History  . Marital Status: Married    Spouse Name: N/A  . Number of Children: 1  . Years of Education: N/A   Occupational History  . Retired     [employer]   Social History Main Topics  . Smoking status: Former Smoker -- 1.50 packs/day for 30 years    Types: Cigarettes    Start date: 12/01/1956    Quit date: 01/08/1995  . Smokeless tobacco: Never Used  . Alcohol Use: 0.0 oz/week    0 Standard drinks or equivalent per week     Comment: 03/18/2014 "no alacohol since 1996"  . Drug Use: No  . Sexual Activity: No   Other Topics Concern  . None   Social History Narrative     Family History:  The patient's family history includes Arthritis in his mother and sister; Cancer in his father; Heart disease in his father; Hypertension in his brother; Parkinsonism in his mother. There is no history of Colon cancer or Colon polyps.     ROS:   Please see the history of present illness.    ROS All other systems reviewed and are negative.   PHYSICAL EXAM:   VS:  BP 130/70 mmHg  Pulse 91  Ht '5\' 10"'$  (1.778 m)  Wt 179 lb 1.9 oz (81.248 kg)  BMI 25.70 kg/m2  SpO2 96%   GEN: Well nourished, well developed, in no acute  distress HEENT: normal Neck: no JVD, carotid bruits, or masses Cardiac: RRR; no murmurs, rubs, or gallops,no edema  Respiratory:  clear to auscultation bilaterally, normal work of breathing GI: soft, nontender, nondistended, + BS MS: no deformity or atrophy Skin: warm and dry, no rash Neuro:  Alert and Oriented x 3, Strength and sensation are intact Psych: euthymic mood, full affect  Wt Readings from Last 3 Encounters:  03/01/16 179 lb 1.9 oz (81.248 kg)  02/29/16 180 lb 3.2 oz (81.738 kg)  02/25/16 176 lb 5.9 oz (80 kg)      Studies/Labs Reviewed:   EKG:  EKG is NOT ordered today.     Recent Labs: 02/21/2016: B Natriuretic Peptide 942.6* 02/23/2016: Magnesium 1.9 02/24/2016: TSH 0.918 02/29/2016: ALT 33; BUN 30*; Creatinine, Ser 1.40; Hemoglobin 10.9*; Platelets 249.0; Potassium 5.0; Pro B Natriuretic peptide (BNP) 1053.0*; Sodium 138   Lipid Panel    Component Value Date/Time   CHOL 148 08/23/2015 0853   CHOL 120 06/30/2014 0810   TRIG 139 08/23/2015 0853   HDL 37* 08/23/2015 0853   HDL 27* 06/30/2014 0810   CHOLHDL 4.0 08/23/2015 0853   CHOLHDL 4.4 06/30/2014 0810   VLDL 45* 06/30/2014 0810   LDLCALC 83 08/23/2015 0853   LDLCALC 48 06/30/2014 0810    Additional studies/ records that were reviewed today include:  2D ECHO: 02/22/2016 LV EF: 30% - 35% Study Conclusions - Left ventricle: There is diffuse hypokinesis and akinesis of the mid anteroseptal, aterior and anterolateral walls and apical anterior and septal walls. The cavity size was normal. There was mild concentric hypertrophy. Systolic function was moderately to severely reduced. The estimated ejection fraction was in the range of 30% to 35%. Features are consistent with a pseudonormal left ventricular filling pattern, with concomitant abnormal relaxation and increased filling pressure (grade 2 diastolic dysfunction). Doppler parameters are consistent with elevated ventricular end-diastolic filling pressure. -  Aortic valve: Trileaflet; normal thickness leaflets. There was no regurgitation. - Aortic root: The aortic root was normal in size. - Mitral valve: Structurally normal valve. There was mild  regurgitation. - Left atrium: The atrium was mildly dilated. - Right ventricle: The cavity size was normal. Wall thickness was normal. Systolic function was normal. - Right atrium: The atrium was normal in size. - Tricuspid valve: There was mild regurgitation. - Pulmonic valve: There was trivial regurgitation. - Pulmonary arteries: Systolic pressure was within the normal range. - Inferior vena cava: The vessel was normal in size. - Pericardium, extracardiac: There was no pericardial effusion.    ASSESSMENT & PLAN:   Chronic combined CHF with cardiomyopathy - 2D ECHO on most recent admission with decrease in left ventricular function to 70-62%, grade 2 diastolic dysfunction, elevated ventricular end-diastolic filling pressure (EF of 45-50% 03/17/14).  This may be related to chemotherapy, however, there are regional wall motion abnormalities. Plan for outpatient ischemic evaluation with lexiscan MV.  Would continue Lasix at present dose. Continue BB. No ACE/ARB due to CKD.  CAD - continue ASA, Plavix, BB, statin. Lexiscan myoview ordered as above.   HTN - controlled.  CKD stage III - baseline Cr appears 1.3-1.6. 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 No transfusion on most recent admission. H/H checked yesterday and improved at 10.9/33.5    Right Lung NSCLC Stage IIIA (T2b, N2, M0) - undergoing chemotherapy. Followed by Dr. Julien Nordmann in the [place]. His CT scan findings were reviewed by the radiation oncologist Dr. Tammi Klippel who recommended 30 day prednisone taper as this could be related to radiation-induced lung injury. He saw Dr. Lenna Gilford  yesterday. Diamox was added.    Medication Adjustments/Labs and Tests Ordered: Current  medicines are reviewed at length with the patient today.  Concerns regarding medicines are outlined above.  Medication changes, Labs and Tests ordered today are listed in the Patient Instructions below. Patient Instructions  Medication Instructions:  Your physician recommends that you continue on your current medications as directed. Please refer to the Current Medication list given to you today.   Labwork: None ordered  Testing/Procedures: Your physician has requested that you have a lexiscan myoview. For further information please visit [email or web address]. Please follow instruction sheet, as given.   Follow-Up: Your physician recommends that you schedule a follow-up appointment in: SEE DR. NISHAN AS SCHEDULED   Any Other Special Instructions Will Be Listed Below (If Applicable).  Pharmacologic Stress Electrocardiogram A pharmacologic stress electrocardiogram is a heart (cardiac) test that uses nuclear imaging to evaluate the blood supply to your heart. This test may also be called a pharmacologic stress electrocardiography. Pharmacologic means that a medicine is used to increase your heart rate and blood pressure.  This stress test is done to find areas of poor blood flow to the heart by determining the extent of coronary artery disease (CAD). Some people exercise on a treadmill, which naturally increases the blood flow to the heart. For those people unable to exercise on a treadmill, a medicine is used. This medicine stimulates your heart and will cause your heart to beat harder and more quickly, as if you were exercising.  Pharmacologic stress tests can help determine:  The adequacy of blood flow to your heart during increased levels of activity in order to clear you for discharge home.  The extent of coronary artery blockage caused by CAD.  Your prognosis if you have suffered a heart attack.  The effectiveness of cardiac procedures done, such as an angioplasty, which can increase  the circulation in your coronary arteries.  Causes of chest pain or pressure. LET [facility] CARE PROVIDER KNOW ABOUT:  Any allergies you have.  All medicines you are taking, including vitamins, herbs, eye drops, creams, and over-the-counter medicines.  Previous problems you or members of your family have had with the use of anesthetics.  Any blood disorders you have.  Previous surgeries you have had.  Medical conditions you have.  Possibility of pregnancy, if this applies.  If you are currently breastfeeding. RISKS AND COMPLICATIONS Generally, this is a safe procedure. However, as with any procedure, complications can occur. Possible complications include:  You develop pain or pressure in the following areas:  Chest.  Jaw or neck.  Between your shoulder blades.  Radiating down your left arm.  Headache.  Dizziness or light-headedness.  Shortness of breath.  Increased or irregular heartbeat.  Low blood pressure.  Nausea or vomiting.  Flushing.  Redness going up the arm and slight pain during injection of medicine.  Heart attack (rare). BEFORE THE PROCEDURE   Avoid all forms of caffeine for 24 hours before your test or as directed by your health care provider. This includes coffee, tea (even decaffeinated tea), caffeinated sodas, chocolate, cocoa, and certain pain medicines.  Follow your health care provider's instructions regarding eating and drinking before the test.  Take your medicines as directed at regular times with water unless instructed otherwise. Exceptions may include:  If you have diabetes, ask how you are to take your insulin or pills. It is common to adjust insulin dosing the morning of the test.  If you are taking beta-blocker medicines,  it is important to talk to your health care provider about these medicines well before the date of your test. Taking beta-blocker medicines may interfere with the test. In some cases, these medicines need to  be changed or stopped 24 hours or more before the test.  If you wear a nitroglycerin patch, it may need to be removed prior to the test. Ask your health care provider if the patch should be removed before the test.  If you use an inhaler for any breathing condition, bring it with you to the test.  If you are an outpatient, bring a snack so you can eat right after the stress phase of the test.  Do not smoke for 4 hours prior to the test or as directed by your health care provider.  Do not apply lotions, powders, creams, or oils on your chest prior to the test.  Wear comfortable shoes and clothing. Let your health care provider know if you were unable to complete or follow the preparations for your test. PROCEDURE   Multiple patches (electrodes) will be put on your chest. If needed, small areas of your chest may be shaved to get better contact with the electrodes. Once the electrodes are attached to your body, multiple wires will be attached to the electrodes, and your heart rate will be monitored.  An IV access will be started. A nuclear trace (isotope) is given. The isotope may be given intravenously, or it may be swallowed. Nuclear refers to several types of radioactive isotopes, and the nuclear isotope lights up the arteries so that the nuclear images are clear. The isotope is absorbed by your body. This results in low radiation exposure.  A resting nuclear image is taken to show how your heart functions at rest.  A medicine is given through the IV access.  A second scan is done about 1 hour after the medicine injection and determines how your heart functions under stress.  During this stress phase, you will be connected to an electrocardiogram machine. Your blood pressure and oxygen levels will be monitored. AFTER THE PROCEDURE   Your heart rate and blood pressure will be monitored after the test.  You may return to your normal schedule, including diet,activities, and medicines,  unless your health care provider tells you otherwise.   This information is not intended to replace advice given to you by your health care provider. Make sure you discuss any questions you have with your health care provider.   Document Released: 12/30/2008 Document Revised: 08/18/2013 Document Reviewed: 04/20/2013 Elsevier Interactive Patient Education [employer].  If you need a refill on your cardiac medications before your next appointment, please call your pharmacy.       Signed, Angelena Form, PA-C  03/01/2016 3:53 PM    [facility] [place], [place], [place]  [address] Phone: [phone]; Fax: [phone]

## 2016-03-01 NOTE — Patient Instructions (Addendum)
Medication Instructions:  Your physician recommends that you continue on your current medications as directed. Please refer to the Current Medication list given to you today.   Labwork: None ordered  Testing/Procedures: Your physician has requested that you have a lexiscan myoview. For further information please visit [email or web address]. Please follow instruction sheet, as given.   Follow-Up: Your physician recommends that you schedule a follow-up appointment in: SEE DR. NISHAN AS SCHEDULED   Any Other Special Instructions Will Be Listed Below (If Applicable).  Pharmacologic Stress Electrocardiogram A pharmacologic stress electrocardiogram is a heart (cardiac) test that uses nuclear imaging to evaluate the blood supply to your heart. This test may also be called a pharmacologic stress electrocardiography. Pharmacologic means that a medicine is used to increase your heart rate and blood pressure.  This stress test is done to find areas of poor blood flow to the heart by determining the extent of coronary artery disease (CAD). Some people exercise on a treadmill, which naturally increases the blood flow to the heart. For those people unable to exercise on a treadmill, a medicine is used. This medicine stimulates your heart and will cause your heart to beat harder and more quickly, as if you were exercising.  Pharmacologic stress tests can help determine:  The adequacy of blood flow to your heart during increased levels of activity in order to clear you for discharge home.  The extent of coronary artery blockage caused by CAD.  Your prognosis if you have suffered a heart attack.  The effectiveness of cardiac procedures done, such as an angioplasty, which can increase the circulation in your coronary arteries.  Causes of chest pain or pressure. LET [facility] CARE PROVIDER KNOW ABOUT:  Any allergies you have.  All medicines you are taking, including vitamins, herbs, eye drops,  creams, and over-the-counter medicines.  Previous problems you or members of your family have had with the use of anesthetics.  Any blood disorders you have.  Previous surgeries you have had.  Medical conditions you have.  Possibility of pregnancy, if this applies.  If you are currently breastfeeding. RISKS AND COMPLICATIONS Generally, this is a safe procedure. However, as with any procedure, complications can occur. Possible complications include:  You develop pain or pressure in the following areas:  Chest.  Jaw or neck.  Between your shoulder blades.  Radiating down your left arm.  Headache.  Dizziness or light-headedness.  Shortness of breath.  Increased or irregular heartbeat.  Low blood pressure.  Nausea or vomiting.  Flushing.  Redness going up the arm and slight pain during injection of medicine.  Heart attack (rare). BEFORE THE PROCEDURE   Avoid all forms of caffeine for 24 hours before your test or as directed by your health care provider. This includes coffee, tea (even decaffeinated tea), caffeinated sodas, chocolate, cocoa, and certain pain medicines.  Follow your health care provider's instructions regarding eating and drinking before the test.  Take your medicines as directed at regular times with water unless instructed otherwise. Exceptions may include:  If you have diabetes, ask how you are to take your insulin or pills. It is common to adjust insulin dosing the morning of the test.  If you are taking beta-blocker medicines, it is important to talk to your health care provider about these medicines well before the date of your test. Taking beta-blocker medicines may interfere with the test. In some cases, these medicines need to be changed or stopped 24 hours or more before the test.  If  you wear a nitroglycerin patch, it may need to be removed prior to the test. Ask your health care provider if the patch should be removed before the  test.  If you use an inhaler for any breathing condition, bring it with you to the test.  If you are an outpatient, bring a snack so you can eat right after the stress phase of the test.  Do not smoke for 4 hours prior to the test or as directed by your health care provider.  Do not apply lotions, powders, creams, or oils on your chest prior to the test.  Wear comfortable shoes and clothing. Let your health care provider know if you were unable to complete or follow the preparations for your test. PROCEDURE   Multiple patches (electrodes) will be put on your chest. If needed, small areas of your chest may be shaved to get better contact with the electrodes. Once the electrodes are attached to your body, multiple wires will be attached to the electrodes, and your heart rate will be monitored.  An IV access will be started. A nuclear trace (isotope) is given. The isotope may be given intravenously, or it may be swallowed. Nuclear refers to several types of radioactive isotopes, and the nuclear isotope lights up the arteries so that the nuclear images are clear. The isotope is absorbed by your body. This results in low radiation exposure.  A resting nuclear image is taken to show how your heart functions at rest.  A medicine is given through the IV access.  A second scan is done about 1 hour after the medicine injection and determines how your heart functions under stress.  During this stress phase, you will be connected to an electrocardiogram machine. Your blood pressure and oxygen levels will be monitored. AFTER THE PROCEDURE   Your heart rate and blood pressure will be monitored after the test.  You may return to your normal schedule, including diet,activities, and medicines, unless your health care provider tells you otherwise.   This information is not intended to replace advice given to you by your health care provider. Make sure you discuss any questions you have with your health  care provider.   Document Released: 12/30/2008 Document Revised: 08/18/2013 Document Reviewed: 04/20/2013 Elsevier Interactive Patient Education [employer].  If you need a refill on your cardiac medications before your next appointment, please call your pharmacy.

## 2016-03-02 ENCOUNTER — Encounter: Payer: Self-pay | Admitting: Family Medicine

## 2016-03-02 ENCOUNTER — Ambulatory Visit (INDEPENDENT_AMBULATORY_CARE_PROVIDER_SITE_OTHER): Payer: Medicare Other | Admitting: Family Medicine

## 2016-03-02 VITALS — BP 118/60 | HR 75 | Ht 70.0 in | Wt 177.4 lb

## 2016-03-02 DIAGNOSIS — J189 Pneumonia, unspecified organism: Secondary | ICD-10-CM | POA: Diagnosis not present

## 2016-03-02 DIAGNOSIS — J45909 Unspecified asthma, uncomplicated: Secondary | ICD-10-CM | POA: Diagnosis not present

## 2016-03-02 DIAGNOSIS — J181 Lobar pneumonia, unspecified organism: Secondary | ICD-10-CM

## 2016-03-02 DIAGNOSIS — J449 Chronic obstructive pulmonary disease, unspecified: Secondary | ICD-10-CM

## 2016-03-02 NOTE — Progress Notes (Signed)
   Subjective:    Patient ID: Paul Sandoval, male    DOB: 1940/11/23, 75 y.o.   MRN: [id number] Patient arrives office for follow-up from the hospital. 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   Just broke a tooth off this morning   Patient was seen at [facility] on 02/21/16.  States no other concerns this visit.  Having substantial shortness of breath with his COPD, wheezing, and pneumonitis secondary to radiation therapy for the lung cancer.  Reports his heart rates came back to more of a normal rate.  Still on low-dose prednisone which he states is helped pneumonitis considerably  Complete hospital record reviewed in presence of patient  Patient did develop right-sided pneumonia. With relatively low toxicity that was question whether this could be a pneumonitis also. Handling antibiotics well. Wheezing has improved Review of Systems Ongoing fatigue, ongoing shortness of breath no particular chest pain. Improved appetite    Objective:   Physical Exam  Alert mild malaise. Somewhat pale speaking in full sentences no major distress lungs rare wheeze with deep breath no crackles no tachypnea heart regular rate and rhythm abdomen benign      Assessment & Plan:  Impression pneumonia/pneumonitis with exacerbation of COPD clinically improved. Also complicated by ongoing presence of lung carcinoma. Pilar Plate discussion also held on this plan finish antibiotics. Maintain albuterol. Maintain other medications. Continue to work on increasing oral intake and supplementing diet/choices discussed. Follow-up as scheduled [facility]

## 2016-03-05 ENCOUNTER — Ambulatory Visit (HOSPITAL_COMMUNITY): Payer: Medicare Other

## 2016-03-05 ENCOUNTER — Other Ambulatory Visit (HOSPITAL_BASED_OUTPATIENT_CLINIC_OR_DEPARTMENT_OTHER): Payer: Medicare Other

## 2016-03-05 ENCOUNTER — Telehealth (HOSPITAL_COMMUNITY): Payer: Self-pay | Admitting: Radiology

## 2016-03-05 DIAGNOSIS — C3411 Malignant neoplasm of upper lobe, right bronchus or lung: Secondary | ICD-10-CM

## 2016-03-05 LAB — COMPREHENSIVE METABOLIC PANEL
ALK PHOS: 97 U/L (ref 40–150)
ALT: 32 U/L (ref 0–55)
ANION GAP: 9 meq/L (ref 3–11)
AST: 21 U/L (ref 5–34)
Albumin: 3.1 g/dL — ABNORMAL LOW (ref 3.5–5.0)
BILIRUBIN TOTAL: 0.37 mg/dL (ref 0.20–1.20)
BUN: 35.9 mg/dL — ABNORMAL HIGH (ref 7.0–26.0)
CO2: 24 meq/L (ref 22–29)
Calcium: 9.4 mg/dL (ref 8.4–10.4)
Chloride: 104 mEq/L (ref 98–109)
Creatinine: 1.5 mg/dL — ABNORMAL HIGH (ref 0.7–1.3)
EGFR: 43 mL/min/{1.73_m2} — AB (ref >90)
GLUCOSE: 119 mg/dL (ref 70–140)
POTASSIUM: 4.9 meq/L (ref 3.5–5.1)
SODIUM: 137 meq/L (ref 136–145)
TOTAL PROTEIN: 7.1 g/dL (ref 6.4–8.3)

## 2016-03-05 LAB — CBC WITH DIFFERENTIAL/PLATELET
BASO%: 0.1 % (ref 0.0–2.0)
BASOS ABS: 0 10*3/uL (ref 0.0–0.1)
EOS ABS: 0 10*3/uL (ref 0.0–0.5)
EOS%: 0.1 % (ref 0.0–7.0)
HCT: 37 % — ABNORMAL LOW (ref 38.4–49.9)
HGB: 11.9 g/dL — ABNORMAL LOW (ref 13.0–17.1)
LYMPH#: 0.6 10*3/uL — AB (ref 0.9–3.3)
LYMPH%: 3 % — AB (ref 14.0–49.0)
MCH: 29.2 pg (ref 27.2–33.4)
MCHC: 32.2 g/dL (ref 32.0–36.0)
MCV: 90.9 fL (ref 79.3–98.0)
MONO#: 0.4 10*3/uL (ref 0.1–0.9)
MONO%: 2.2 % (ref 0.0–14.0)
NEUT#: 18 10*3/uL — ABNORMAL HIGH (ref 1.5–6.5)
NEUT%: 94.6 % — AB (ref 39.0–75.0)
PLATELETS: 195 10*3/uL (ref 140–400)
RBC: 4.07 10*6/uL — AB (ref 4.20–5.82)
RDW: 19.2 % — AB (ref 11.0–14.6)
WBC: 19 10*3/uL — AB (ref 4.0–10.3)
nRBC: 0 % (ref 0–0)

## 2016-03-05 NOTE — Telephone Encounter (Signed)
Patient given detailed instructions per Myocardial Perfusion Study Information Sheet for the test on 03/06/2016 at 10:00. Patient notified to arrive 15 minutes early and that it is imperative to arrive on time for appointment to keep from having the test rescheduled.  If you need to cancel or reschedule your appointment, please call the office within 24 hours of your appointment. Failure to do so may result in a cancellation of your appointment, and a $50 no show fee. Patient verbalized understanding.EHK

## 2016-03-06 ENCOUNTER — Ambulatory Visit (HOSPITAL_COMMUNITY): Payer: Medicare Other | Attending: Cardiology

## 2016-03-06 ENCOUNTER — Telehealth: Payer: Self-pay | Admitting: Radiation Oncology

## 2016-03-06 DIAGNOSIS — I119 Hypertensive heart disease without heart failure: Secondary | ICD-10-CM | POA: Diagnosis not present

## 2016-03-06 DIAGNOSIS — I779 Disorder of arteries and arterioles, unspecified: Secondary | ICD-10-CM | POA: Insufficient documentation

## 2016-03-06 DIAGNOSIS — R0602 Shortness of breath: Secondary | ICD-10-CM | POA: Diagnosis not present

## 2016-03-06 DIAGNOSIS — I519 Heart disease, unspecified: Secondary | ICD-10-CM | POA: Diagnosis not present

## 2016-03-06 DIAGNOSIS — R9439 Abnormal result of other cardiovascular function study: Secondary | ICD-10-CM | POA: Insufficient documentation

## 2016-03-06 LAB — MYOCARDIAL PERFUSION IMAGING
CHL CUP NUCLEAR SRS: 18
CHL CUP NUCLEAR SSS: 21
LV sys vol: 142 mL
LVDIAVOL: 202 mL (ref 62–150)
NUC STRESS TID: 0.97
Peak HR: 88 {beats}/min
RATE: 0.26
Rest HR: 71 {beats}/min
SDS: 3

## 2016-03-06 MED ORDER — TECHNETIUM TC 99M TETROFOSMIN IV KIT
32.8000 | PACK | Freq: Once | INTRAVENOUS | Status: AC | PRN
Start: 2016-03-06 — End: 2016-03-06
  Administered 2016-03-06: 32.8 via INTRAVENOUS
  Filled 2016-03-06: qty 33

## 2016-03-06 MED ORDER — REGADENOSON 0.4 MG/5ML IV SOLN
0.4000 mg | Freq: Once | INTRAVENOUS | Status: AC
Start: 2016-03-06 — End: 2016-03-06
  Administered 2016-03-06: 0.4 mg via INTRAVENOUS

## 2016-03-06 MED ORDER — TECHNETIUM TC 99M TETROFOSMIN IV KIT
10.2000 | PACK | Freq: Once | INTRAVENOUS | Status: AC | PRN
  Administered 2016-03-06: 10 via INTRAVENOUS
  Filled 2016-03-06: qty 10

## 2016-03-06 NOTE — Telephone Encounter (Signed)
Phoned patient's home to discuss hoarseness, straining to speak and Shona Simpson, PA-C offer to prescribe Hycodan. No answer. Left message requesting return call.

## 2016-03-07 ENCOUNTER — Telehealth: Payer: Self-pay | Admitting: Radiation Oncology

## 2016-03-07 NOTE — Telephone Encounter (Signed)
Unable to connect with patient yesterday I phoned him again this morning. Offered hycodan script per Shona Simpson, PA-C order. Patient confirms he continues to strain to speak and his voice is still hoarse but, he would like to wait to pick up any scripts until tomorrow when he sees the providers. States, "I don't believe this is critical I just want to ensure everyone is aware."

## 2016-03-08 ENCOUNTER — Encounter: Payer: Self-pay | Admitting: Radiation Oncology

## 2016-03-08 ENCOUNTER — Ambulatory Visit
Admission: RE | Admit: 2016-03-08 | Discharge: 2016-03-08 | Disposition: A | Discharge status: Home / Self Care | Payer: Medicare Other | Source: Ambulatory Visit | Attending: Radiation Oncology | Admitting: Radiation Oncology

## 2016-03-08 ENCOUNTER — Encounter: Payer: Self-pay | Admitting: Family Medicine

## 2016-03-08 ENCOUNTER — Telehealth: Payer: Self-pay | Admitting: Pulmonary Disease

## 2016-03-08 ENCOUNTER — Telehealth: Payer: Self-pay | Admitting: Cardiovascular Disease

## 2016-03-08 VITALS — BP 119/57 | HR 71 | Temp 97.7°F | Ht 70.0 in | Wt 175.9 lb

## 2016-03-08 DIAGNOSIS — Z955 Presence of coronary angioplasty implant and graft: Secondary | ICD-10-CM | POA: Diagnosis not present

## 2016-03-08 DIAGNOSIS — Z801 Family history of malignant neoplasm of trachea, bronchus and lung: Secondary | ICD-10-CM | POA: Diagnosis not present

## 2016-03-08 DIAGNOSIS — Z8249 Family history of ischemic heart disease and other diseases of the circulatory system: Secondary | ICD-10-CM | POA: Insufficient documentation

## 2016-03-08 DIAGNOSIS — R590 Localized enlarged lymph nodes: Secondary | ICD-10-CM | POA: Insufficient documentation

## 2016-03-08 DIAGNOSIS — Z951 Presence of aortocoronary bypass graft: Secondary | ICD-10-CM | POA: Insufficient documentation

## 2016-03-08 DIAGNOSIS — I255 Ischemic cardiomyopathy: Secondary | ICD-10-CM | POA: Diagnosis not present

## 2016-03-08 DIAGNOSIS — I4510 Unspecified right bundle-branch block: Secondary | ICD-10-CM | POA: Insufficient documentation

## 2016-03-08 DIAGNOSIS — Z8489 Family history of other specified conditions: Secondary | ICD-10-CM | POA: Insufficient documentation

## 2016-03-08 DIAGNOSIS — Z7982 Long term (current) use of aspirin: Secondary | ICD-10-CM | POA: Diagnosis not present

## 2016-03-08 DIAGNOSIS — I252 Old myocardial infarction: Secondary | ICD-10-CM | POA: Diagnosis not present

## 2016-03-08 DIAGNOSIS — Z881 Allergy status to other antibiotic agents status: Secondary | ICD-10-CM | POA: Diagnosis not present

## 2016-03-08 DIAGNOSIS — I714 Abdominal aortic aneurysm, without rupture: Secondary | ICD-10-CM | POA: Insufficient documentation

## 2016-03-08 DIAGNOSIS — Z923 Personal history of irradiation: Secondary | ICD-10-CM | POA: Insufficient documentation

## 2016-03-08 DIAGNOSIS — Z9221 Personal history of antineoplastic chemotherapy: Secondary | ICD-10-CM | POA: Insufficient documentation

## 2016-03-08 DIAGNOSIS — I10 Essential (primary) hypertension: Secondary | ICD-10-CM | POA: Insufficient documentation

## 2016-03-08 DIAGNOSIS — Z8261 Family history of arthritis: Secondary | ICD-10-CM | POA: Diagnosis not present

## 2016-03-08 DIAGNOSIS — Z96642 Presence of left artificial hip joint: Secondary | ICD-10-CM | POA: Diagnosis not present

## 2016-03-08 DIAGNOSIS — R490 Dysphonia: Secondary | ICD-10-CM | POA: Insufficient documentation

## 2016-03-08 DIAGNOSIS — K219 Gastro-esophageal reflux disease without esophagitis: Secondary | ICD-10-CM | POA: Insufficient documentation

## 2016-03-08 DIAGNOSIS — J440 Chronic obstructive pulmonary disease with acute lower respiratory infection: Secondary | ICD-10-CM | POA: Diagnosis not present

## 2016-03-08 DIAGNOSIS — J189 Pneumonia, unspecified organism: Secondary | ICD-10-CM | POA: Insufficient documentation

## 2016-03-08 DIAGNOSIS — Z9049 Acquired absence of other specified parts of digestive tract: Secondary | ICD-10-CM | POA: Insufficient documentation

## 2016-03-08 DIAGNOSIS — C3411 Malignant neoplasm of upper lobe, right bronchus or lung: Secondary | ICD-10-CM | POA: Insufficient documentation

## 2016-03-08 DIAGNOSIS — K5790 Diverticulosis of intestine, part unspecified, without perforation or abscess without bleeding: Secondary | ICD-10-CM | POA: Diagnosis not present

## 2016-03-08 DIAGNOSIS — Z8371 Family history of colonic polyps: Secondary | ICD-10-CM | POA: Insufficient documentation

## 2016-03-08 DIAGNOSIS — E785 Hyperlipidemia, unspecified: Secondary | ICD-10-CM | POA: Diagnosis not present

## 2016-03-08 DIAGNOSIS — Z87891 Personal history of nicotine dependence: Secondary | ICD-10-CM | POA: Diagnosis not present

## 2016-03-08 DIAGNOSIS — Z800 Family history of malignant neoplasm of digestive organs: Secondary | ICD-10-CM | POA: Diagnosis not present

## 2016-03-08 DIAGNOSIS — I2510 Atherosclerotic heart disease of native coronary artery without angina pectoris: Secondary | ICD-10-CM | POA: Insufficient documentation

## 2016-03-08 DIAGNOSIS — K635 Polyp of colon: Secondary | ICD-10-CM | POA: Insufficient documentation

## 2016-03-08 MED ORDER — FLUTICASONE FUROATE-VILANTEROL 100-25 MCG/INH IN AEPB: 1.0000 | INHALATION_SPRAY | Freq: Every day | RESPIRATORY_TRACT | Status: DC

## 2016-03-08 NOTE — Telephone Encounter (Signed)
F/u Message  Pt returning RN call about labs. Please call back to discuss

## 2016-03-08 NOTE — Progress Notes (Signed)
Paul Sandoval is here for follow up of radiation completed 01/26/16 to his Right Lung and involved mediastinal adenopathy. He has been to the Emergency Room with fevers, and pneumonia with several admissions. He has improved now per his report he is currently taking a steoroid taper.  He has had some shortness of breath, but it improved after his hospital stay. He is exercising by walking about a mile daily. He denies nausea or a cough. He is eating well per his report since 02/25/16, but is concerned because he is not gaining weight.   BP 119/57 mmHg  Pulse 71  Temp(Src) 97.7 F (36.5 C)  Ht '5\' 10"'$  (1.778 m)  Wt 175 lb 14.4 oz (79.788 kg)  BMI 25.24 kg/m2  SpO2 99%   Wt Readings from Last 3 Encounters:  03/08/16 175 lb 14.4 oz (79.788 kg)  03/06/16 179 lb (81.194 kg)  03/02/16 177 lb 6 oz (80.457 kg)

## 2016-03-08 NOTE — Telephone Encounter (Signed)
Returned pts call.  Discussed preliminary stress test results.

## 2016-03-08 NOTE — Telephone Encounter (Signed)
I received this fax from the [place] last week and placed it in RA folder

## 2016-03-08 NOTE — Telephone Encounter (Signed)
Spoke with pt, needs a hard copy of 90-day rx of Breo, as well as PFT results and last office visit notes [facility] on [facility].  SN signed rx as RA has been out of office this week.  Forms left up front for pt to pick up as he had an appt at [facility], pt is aware that these forms are ready for him to pick up after appt.  Nothing further needed.

## 2016-03-09 NOTE — Addendum Note (Signed)
Encounter addended by: Benn Moulder, RN on: 03/09/2016  2:49 PM<BR>     Documentation filed: Demographics Visit

## 2016-03-09 NOTE — Progress Notes (Signed)
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  Interval Since Last Radiation:  6 weeks  12/12/2015-01/26/2016  Site/dose: The primary tumor and involved mediastinal adenopathy were treated to 66 Gy in 33 fractions of 2 Gy.  Narrative:  The patient returns today for routine follow-up.  Of note he was hospitalized between  02/21/16 and 02/25/16 and during that time was diagnosed with dehydration and radiation pneumonitis. He did very well however during treatment and comes for a follow up.                            On review of systems, the patient states that since starting prednisone he is feeling better. He is walking 1-1.5 miles daily during the week. He denies cough, shortness of breath, fever, chills, or chest pain. He reports some hoarseness that has been going on since treatment but is not responding to over the counter cold medication. No other complaints are noted.  Past Medical History:  Past Medical History  Diagnosis Date  . 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 AAA (abdominal aortic aneurysm) ([place]) 2010    4.4 cm 08/2008;4.44 in 7/10 and 4.65 in 08/2009; 4.8 by CT in 11/2009; 4.3 by ultrasound in 08/2010  . Hypertension   . Hyperlipidemia   . Tobacco abuse, in remission     40 pack year total consumption; discontinued in 1996  . GERD (gastroesophageal reflux disease)   . Right bundle branch block   . Diverticulosis   . Colonic polyp 2002    polypectomy in 2002  . COPD (chronic obstructive pulmonary disease) ([place])   . CAD (coronary artery disease)     03/18/14:  PCI with DES to distal left main. 7/29: DES to the SVG to Diag  . ED (erectile dysfunction)   . IFG (impaired  fasting glucose)   . Chronic rhinitis   . Myocardial infarction [facility])     "told h/o silent MI sometime before 1996"  . Pneumonia ~ 2001; ~ 2005  . Chronic bronchitis ([place])   . Arthritis     "fingers" (03/18/2014)  . Cardiomyopathy, ischemic     Echo 03/17/14: EF 45-50%  . Encounter for antineoplastic chemotherapy 12/19/2015  . Cancer [facility])     Upper right lobe lung cancer    Past Surgical History: Past Surgical History  Procedure Laterality Date  . Colonoscopy  2002    polypectomy-patient denies  . Laparoscopic cholecystectomy  12/2009  . Abdominal aortic endovascular stent graft N/A 12/11/2012    Procedure: ABDOMINAL AORTIC ENDOVASCULAR STENT GRAFT;  Surgeon: Mal Misty, MD;  Location: [place];  Service: Vascular;  Laterality: N/A;  Ultrasound guided; [place]  . Abdominal aortic aneurysm repair  11/2012  . Total hip arthroplasty Left 01/21/2013    Procedure: TOTAL HIP ARTHROPLASTY ANTERIOR APPROACH;  Surgeon: Mauri Pole, MD;  Location: [place];  Service: Orthopedics;  Laterality: Left;  . Joint replacement    . Coronary artery bypass graft  01/09/1995    "CABG X3"  . Cardiac catheterization  01/08/1995  . Coronary angioplasty with stent placement  03/18/2014    "  1"  . Coronary angioplasty with stent placement  03/24/2014    "1"  . Left and right heart catheterization with coronary/graft angiogram N/A 03/18/2014    Procedure: LEFT AND RIGHT HEART CATHETERIZATION WITH Beatrix Fetters;  Surgeon: Blane Ohara, MD;  Location: [facility] CATH LAB;  Service: Cardiovascular;  Laterality: N/A;  . Percutaneous coronary stent intervention (pci-s)  03/18/2014    Procedure: PERCUTANEOUS CORONARY STENT INTERVENTION (PCI-S);  Surgeon: Blane Ohara, MD;  Location: [facility] CATH LAB;  Service: Cardiovascular;;  . Percutaneous coronary stent intervention (pci-s) N/A 03/24/2014    Procedure: PERCUTANEOUS CORONARY STENT INTERVENTION (PCI-S);  Surgeon: Blane Ohara, MD;  Location: [facility] CATH LAB;  Service:  Cardiovascular;  Laterality: N/A;  . Video bronchoscopy N/A 11/17/2015    Procedure: VIDEO BRONCHOSCOPY WITH FLUORO;  Surgeon: Rigoberto Noel, MD;  Location: [place];  Service: Cardiopulmonary;  Laterality: N/A;    Social History:  Social History   Social History  . Marital Status: Married    Spouse Name: N/A  . Number of Children: 1  . Years of Education: N/A   Occupational History  . Retired     [employer]   Social History Main Topics  . Smoking status: Former Smoker -- 1.50 packs/day for 30 years    Types: Cigarettes    Start date: 12/01/1956    Quit date: 01/08/1995  . Smokeless tobacco: Never Used  . Alcohol Use: 0.0 oz/week    0 Standard drinks or equivalent per week     Comment: 03/18/2014 "no alacohol since 1996"  . Drug Use: No  . Sexual Activity: No   Other Topics Concern  . Not on file   Social History Narrative    Family History: Family History  Problem Relation Age of Onset  . Colon cancer Neg Hx   . Colon polyps Neg Hx   . Heart disease Father   . Arthritis Mother   . Parkinsonism Mother   . Arthritis Sister     Brother with rheumatoid arthritis  . Cancer Father     Lung  . Hypertension Brother     ALLERGIES:  is allergic to neomycin.  Meds: Current Outpatient Prescriptions  Medication Sig Dispense Refill  . acetaminophen (TYLENOL) 500 MG tablet Take 500 mg by mouth every 6 (six) hours as needed for mild pain.    Marland Kitchen acetaZOLAMIDE (DIAMOX SEQUELS) 500 MG capsule Take 1 capsule (500 mg total) by mouth daily. At 4pm 30 capsule 3  . aspirin EC 81 MG tablet Take 81 mg by mouth daily.    . clopidogrel (PLAVIX) 75 MG tablet Take 1 tablet (75 mg total) by mouth daily. Reported on 12/02/2015 90 tablet 3  . Ferrous Sulfate (IRON) 28 MG TABS Take 28 mg by mouth daily.    . furosemide (LASIX) 40 MG tablet Take 1 tablet (40 mg total) by mouth daily. 30 tablet 0  . metoprolol 75 MG TABS Take 75 mg by mouth 2 (two) times daily. Dispense as needed  1 month supply only 30 tablet 0  . Multiple Vitamins-Minerals (CENTRUM SILVER ADULT 50+) TABS Take 1 tablet by mouth daily.     . nitroGLYCERIN (NITROSTAT) 0.4 MG SL tablet Place 1 tablet (0.4 mg total) under the tongue every 5 (five) minutes as needed for chest pain. 25 tablet 4  . omeprazole (PRILOSEC) 20 MG capsule TAKE ONE CAPSULE BY MOUTH DAILY. (Patient taking differently: TAKE 20 MG BY MOUTH DAILY.) 90 capsule 1  . predniSONE (DELTASONE)  5 MG tablet Label  & dispense according to the schedule below. 0 refills. Take '60mg'$  PO daily x 5 days, then '50mg'$  x 5 days, then '40mg'$  x 5 days, then '30mg'$  x 5 days, then '20mg'$  x 5 days, then '10mg'$  x 5 days, then '5mg'$  x 5 days, then 2.'5mg'$  x 5 days, then 1 mg x 5 days, then 0.'5mg'$  x 5 days and stop. 100 tablet 0  . simvastatin (ZOCOR) 80 MG tablet Take 0.5 tablets (40 mg total) by mouth at bedtime. 15 tablet 5  . VENTOLIN HFA 108 (90 Base) MCG/ACT inhaler INHALE 2 PUFFS BY MOUTH EVERY 4 TO 6 HOURS AS NEEDED FOR WHEEZING. 18 g 5  . fluticasone furoate-vilanterol (BREO ELLIPTA) 100-25 MCG/INH AEPB Inhale 1 puff into the lungs daily. 180 each 3   No current facility-administered medications for this encounter.    Physical Findings:  height is '5\' 10"'$  (1.778 m) and weight is 175 lb 14.4 oz (79.788 kg). His temperature is 97.7 F (36.5 C). His blood pressure is 119/57 and his pulse is 71. His oxygen saturation is 99%.   In general this is a well appearing caucasian male in no acute distress. He's alert and oriented x4 and appropriate throughout the examination. Cardiopulmonary reveals a RRR, no clicks, rubs, or murmurs are noted. Chest is clear to ausculation bilaterally and he exhibits normal effort.    Lab Findings: Lab Results  Component Value Date   WBC 19.0* 03/05/2016   HGB 11.9* 03/05/2016   HCT 37.0* 03/05/2016   MCV 90.9 03/05/2016   PLT 195 03/05/2016     Radiographic Findings: Dg Chest 2 View  02/29/2016  CLINICAL DATA:  Pneumonia.  Pleural  effusion. EXAM: CHEST  2 VIEW COMPARISON:  CT 02/24/2016.  Chest x-ray 02/23/2016. FINDINGS: Prior CABG. Heart size stable. Stable postradiation changes/infiltrate right upper lobe. Mild bilateral pleural effusions cannot be excluded. No pneumothorax. Degenerative changes thoracic spine. IMPRESSION: 1. Stable right upper lobe postradiation changes and/or infiltrate.Tiny bilateral pleural effusions cannot be excluded. 2. Prior CABG.  Stable cardiomegaly. Electronically Signed   By: Marcello Moores  Register   On: 02/29/2016 12:53   Dg Chest 2 View  02/21/2016  CLINICAL DATA:  Stage IV lung malignancy status post chemotherapy and radiation; recent episode of pneumonia; increase shortness of breath and orthopnea. EXAM: CHEST  2 VIEW COMPARISON:  PA and lateral chest x-ray of February 16, 2016 FINDINGS: The right upper lobe density has become more confluent. The right lower lung is adequately inflated. There is a small right pleural effusion which is unchanged. The left lung is clear. There are post CABG changes. There is no cardiomegaly or pulmonary edema. The bony thorax exhibits no acute abnormality. IMPRESSION: Worsening of alveolar opacity in the right upper lobe which may reflect pneumonia either typical or postobstructive. Lymphangitic spread of malignancy could be present as well but is fell less likely than progressive pneumonia. There is no CHF. There is a small right pleural effusion. Electronically Signed   By: David  Martinique M.D.   On: 02/21/2016 11:02   Dg Chest 2 View  02/16/2016  CLINICAL DATA:  Shortness of breath with exertion, fever, cough EXAM: CHEST  2 VIEW COMPARISON:  Chest x-ray of 02/05/2016 FINDINGS: There is parenchymal opacity within the right upper lobe consistent with pneumonia. The left lung is clear. Mediastinal hilar contours are unremarkable. Followup PA and lateral chest X-ray is recommended in 3-4 weeks following trial of antibiotic therapy to ensure resolution and exclude underlying  malignancy. The heart is within normal limits in size. No bony abnormality is seen. Median sternotomy sutures are noted from CABG. IMPRESSION: Right upper lobe opacity most consistent with pneumonia. Followup PA and lateral chest X-ray is recommended in 3-4 weeks following trial of antibiotic therapy to ensure resolution and exclude underlying malignancy. Electronically Signed   By: Ivar Drape M.D.   On: 02/16/2016 15:19   Ct Chest W Contrast  02/21/2016  CLINICAL DATA:  75 year old with current history of squamous cell carcinoma of the right upper lobe, treated with radiation therapy and chemotherapy, completed earlier this month. Patient was admitted 02/05/2016 with community acquired pneumonia. He presents again today with cough and fever. EXAM: CT CHEST WITH CONTRAST TECHNIQUE: Multidetector CT imaging of the chest was performed during intravenous contrast administration. CONTRAST:  24m ISOVUE-300 IOPAMIDOL (ISOVUE-300) INJECTION 61% IV. COMPARISON:  CT CT 11/25/2015.  CTA chest 11/11/2015, 03/10/2012. FINDINGS: Cardiovascular: Normal heart size. Extensive 3 vessel coronary atherosclerosis. Prior sternotomy for CABG. Visualized portions of the coronary grafts patent. No pericardial effusion moderate to severe atherosclerosis involving the thoracic and upper abdominal aorta without aneurysm. Atherosclerosis involving the proximal great vessels without visible stenosis. Mediastinum/Lymph Nodes: Interval marked decrease in size of the pathologic low right paratracheal mediastinal lymph node (station 4R) since the PET-CT, current measurement approximating 1.1 x 1.3 cm (previously 2.0 x 1.8 cm). The hypermetabolic right axillary lymph node on the PET-CT is unchanged to perhaps slightly decreased in size. No new or enlarging lymphadenopathy. Small hiatal hernia. Thyroid gland unremarkable. Lungs/Pleura: Since the PET-CT, marked reduction in size of the right upper lobe lung mass, now measuring approximately 2.2 x  1.9 cm (series 5, image 37), previously 4.3 x 3.4 cm. Interval development of confluent airspace opacities with air bronchograms in the right upper lobe and in the superior segment right lower lobe, associated with a large right pleural effusion. Patchy airspace opacities are present medially in the left upper lobe. Small left pleural effusion. Upper abdomen: Surgically absent gallbladder. The he visualized proximal portion of the abdominal aortic stent graft is unremarkable. No acute findings. 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 No evidence of osseous metastatic disease. IMPRESSION: 1. Confluent airspace pneumonia involving the right upper lobe and the superior segment right lower lobe, with patchy pneumonia involving the left upper lobe. The right upper lobe pneumonia is superimposed upon post radiation changes. 2. Large right pleural effusion and small left pleural effusion. 3. Interval marked reduction in size of the right upper lobe lung cancer since the prior PET-CT 11/25/2015. Measurements are given above. 4. Interval reduction in size of the pathologic station 4R mediastinal lymph node since the prior PET-CT. No new or enlarging lymphadenopathy. Electronically Signed   By: TEvangeline DakinM.D.   On: 02/21/2016 19:10   Ct Angio Chest Pe W Or Wo Contrast  02/24/2016  CLINICAL DATA:  75year old male with tachycardia EXAM: CT ANGIOGRAPHY CHEST WITH CONTRAST TECHNIQUE: Multidetector CT imaging of the chest was performed using the standard protocol during bolus administration of intravenous contrast. Multiplanar CT image reconstructions and MIPs were obtained to evaluate the vascular anatomy. CONTRAST:  100 mL Isovue 370 COMPARISON:  Chest x-ray 02/23/2016; recent CT scan of the chest 02/21/2016; PET-CT 11/25/2015 FINDINGS: Mediastinum: No mediastinal mass or suspicious adenopathy. Small hiatal hernia. Unremarkable visualized thyroid gland. Heart/Vascular:  Adequate opacification of the pulmonary arteries to the proximal subsegmental level. No evidence of pulmonary embolus. Patient is status post coronary artery bypass grafting with  a patent LIMA to distal LAD graft, and patent aortic to posterior descending coronary artery. A metallic stent is present in the left main coronary artery. The heart is normal in size. No pericardial effusion. No aortic aneurysm. Conventional 3 vessel arch anatomy. Lungs/Pleura: Persistent but improving right layering pleural effusion. Trace left effusion is also slightly smaller. Similar appearance of architectural distortion and diffuse predominantly interstitial opacities throughout the right upper lobe and superior segment of the right lower lobe. Findings remain most consistent with changes of prior external beam radiation therapy. Similar changes are present in the medial aspect of the lingula. No new focal airspace consolidation, or mass. The soft tissue nodule in the medial aspect of the right upper lobe remains essentially unchanged at 2.0 x 1.8 cm. Bones/Soft Tissues: No acute fracture or aggressive appearing lytic or blastic osseous lesion. Upper Abdomen: Surgical changes of prior cholecystectomy. Otherwise, no acute findings in the upper abdomen. Review of the MIP images confirms the above findings. IMPRESSION: 1. Negative for acute pulmonary embolus, pneumonia or other acute cardiopulmonary process. 2. Similar appearance of post radiation changes in the right upper lobe, superior segment of the right lower lobe and medial aspect of the left upper lobe. 3. Decreasing (compared to 02/21/2016) bilateral pleural effusions now small on the right and trace on the left. 4. Stable right upper lobe pulmonary nodule status post treatment with radiation therapy. Electronically Signed   By: Jacqulynn Cadet M.D.   On: 02/24/2016 14:46   Dg Chest Port 1 View  02/23/2016  CLINICAL DATA:  Shortness of breath. EXAM: PORTABLE CHEST 1 VIEW  COMPARISON:  02/22/2016. FINDINGS: Prior CABG. Heart size normal. Right upper lobe infiltrate and atelectasis again noted. No interim change. Continued follow-up exam suggested demonstrate clearing . No pleural effusion or pneumothorax. IMPRESSION: 1.  Prior CABG.  Heart size normal. 2. Right upper lobe infiltrate and atelectasis again noted without significant change. Continued follow-up exams to demonstrate clearing suggested. Electronically Signed   By: Marcello Moores  Register   On: 02/23/2016 06:57   Dg Chest Port 1 View  02/22/2016  CLINICAL DATA:  Pleural effusion, shortness of Breath EXAM: PORTABLE CHEST 1 VIEW COMPARISON:  Chest x-ray and CT 02/21/2016 FINDINGS: Consolidation in the right upper lobe compatible with pneumonia previously seen effusion by CT not definitively visualized on this single view portable chest x-ray. No focal opacity on the left. Prior CABG. Heart is normal size. No acute bony abnormality. IMPRESSION: Continued consolidation in the right upper lobe most compatible with pneumonia. Previously seen pleural effusions cannot be visualized on this portable study. Electronically Signed   By: Rolm Baptise M.D.   On: 02/22/2016 11:53    Impression/Plan: 1. 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 The patient will continue under the care of Dr. Julien Nordmann for further management of his disease. We will see him back on an as needed basis.    2. 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 The patient is taking 40 mg of prednisone and has 3 more days at this dose prior to continued taper. He seems to be clinically doing very well and will keep [place] informed of any changes in his progress. 3. Hoarseness. We discussed that this could be due to his recent radiotherapy. I will contact the patient by phone in about 4 weeks to follow up on this. We will determine at that time if he needs a referral to ENT.     Lalla Brothers  Dara Lords, [facility] Seen with and dictate  for Rodman Key A. Tammi Klippel, MD

## 2016-03-13 ENCOUNTER — Encounter: Payer: Self-pay | Admitting: Internal Medicine

## 2016-03-13 ENCOUNTER — Encounter: Payer: Self-pay | Admitting: *Deleted

## 2016-03-13 ENCOUNTER — Ambulatory Visit (HOSPITAL_BASED_OUTPATIENT_CLINIC_OR_DEPARTMENT_OTHER): Payer: Medicare Other | Admitting: Internal Medicine

## 2016-03-13 ENCOUNTER — Telehealth: Payer: Self-pay | Admitting: Cardiovascular Disease

## 2016-03-13 VITALS — BP 119/50 | HR 68 | Temp 97.7°F | Resp 18 | Ht 70.0 in | Wt 176.5 lb

## 2016-03-13 DIAGNOSIS — C3411 Malignant neoplasm of upper lobe, right bronchus or lung: Secondary | ICD-10-CM

## 2016-03-13 NOTE — Progress Notes (Signed)
Oncology Nurse Navigator Documentation  Oncology Nurse Navigator Flowsheets 03/13/2016  Navigator Location [facility]Med Onc  Navigator Encounter Type Follow-up Appt  Patient Visit Type MedOnc  Treatment Phase Post-Tx Follow-up  Barriers/Navigation Needs Education  Education Other  Interventions Education Method  Education Method Verbal  Acuity Level 1  Acuity Level 1 Minimal follow up required  Time Spent with Patient 37   Spoke with Paul Sandoval today.  He is doing well.  He is now on observation for 2 months.  I asked that he call if he has any concerning symptoms.

## 2016-03-13 NOTE — Telephone Encounter (Signed)
-----   Message from Charlie Pitter, [place] sent at 03/12/2016 12:35 PM EDT ----- Please let patient know Dr. Raliegh Ip has reviewed his stress test and agrees with continuing medical therapy for now, no additional procedures needed at this time. Dayna Dunn PA-C

## 2016-03-13 NOTE — Addendum Note (Signed)
Encounter addended by: Benn Moulder, RN on: 03/13/2016  5:23 PM<BR>     Documentation filed: Charges VN

## 2016-03-13 NOTE — Telephone Encounter (Signed)
Follow-up      The pt is returning Jennifer's call

## 2016-03-13 NOTE — Telephone Encounter (Signed)
Returned pts call and he has been made aware of his stress test results.

## 2016-03-13 NOTE — Progress Notes (Signed)
[place] [phone]   [phone]  OFFICE PROGRESS NOTE  [name], MD [place] [address]  DIAGNOSIS: Stage IIIA (T2b, N2, M0) non-small cell lung cancer, favoring squamous cell carcinoma presented with right upper lobe lung mass in addition to mediastinal lymphadenopathy diagnosed in March 2017.  PRIOR THERAPY:  A course of concurrent chemoradiation with weekly carboplatin for AUC of 2 and paclitaxel 45 MG/M2. Status post 5 cycle with partial response.  CURRENT THERAPY: Observation  INTERVAL HISTORY: Paul Sandoval 75 y.o. male returns to the clinic today for follow-up visit. The patient is feeling fine today with no specific complaints. He was admitted few times to [facility] with unexplained fever and questionable pneumonia. He was also found to have congestive heart failure and COPD exacerbation. He denied having any fever or chills for the last 2 weeks. He is feeling much better. He tolerated treatment with concurrent chemoradiation fairly well with no significant adverse effects. He denied having any significant nausea or vomiting. He has no fever or chills. The patient denied having any significant chest pain, shortness of breath, cough or hemoptysis. He has no dysphagia or odynophagia. He had repeat imaging studies performed recently during his hospitalization and he is here for evaluation and discussion of his treatment options.  MEDICAL HISTORY: Past Medical History  Diagnosis Date  . 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 AAA (abdominal aortic aneurysm) ([place]) 2010    4.4 cm 08/2008;4.44 in 7/10 and 4.65 in 08/2009; 4.8 by CT in 11/2009; 4.3 by ultrasound in 08/2010  . Hypertension   . Hyperlipidemia   . Tobacco abuse, in remission     40 pack year total consumption; discontinued in 1996  . GERD (gastroesophageal reflux disease)   . Right bundle branch block   .  Diverticulosis   . Colonic polyp 2002    polypectomy in 2002  . COPD (chronic obstructive pulmonary disease) ([place])   . CAD (coronary artery disease)     03/18/14:  PCI with DES to distal left main. 7/29: DES to the SVG to Diag  . ED (erectile dysfunction)   . IFG (impaired fasting glucose)   . Chronic rhinitis   . Myocardial infarction [facility])     "told h/o silent MI sometime before 1996"  . Pneumonia ~ 2001; ~ 2005  . Chronic bronchitis ([place])   . Arthritis     "fingers" (03/18/2014)  . Cardiomyopathy, ischemic     Echo 03/17/14: EF 45-50%  . Encounter for antineoplastic chemotherapy 12/19/2015  . Cancer ([place])     Upper right lobe lung cancer    ALLERGIES:  is allergic to neomycin.  MEDICATIONS:  Current Outpatient Prescriptions  Medication Sig Dispense Refill  . acetaminophen (TYLENOL) 500 MG tablet Take 500 mg by mouth every 6 (six) hours as needed for mild pain.    Marland Kitchen acetaZOLAMIDE (DIAMOX SEQUELS) 500 MG capsule Take 1 capsule (500 mg total) by mouth daily. At 4pm 30 capsule 3  . aspirin EC 81 MG tablet Take 81 mg by mouth daily.    . clopidogrel (PLAVIX) 75 MG tablet Take 1 tablet (75 mg total) by mouth daily. Reported on 12/02/2015 90 tablet 3  . Ferrous Sulfate (IRON) 28 MG TABS Take 28 mg by mouth daily.    . fluticasone furoate-vilanterol (BREO ELLIPTA) 100-25 MCG/INH AEPB Inhale 1 puff into the lungs daily. 180 each 3  .  furosemide (LASIX) 40 MG tablet Take 1 tablet (40 mg total) by mouth daily. 30 tablet 0  . metoprolol 75 MG TABS Take 75 mg by mouth 2 (two) times daily. Dispense as needed 1 month supply only 30 tablet 0  . Multiple Vitamins-Minerals (CENTRUM SILVER ADULT 50+) TABS Take 1 tablet by mouth daily.     . nitroGLYCERIN (NITROSTAT) 0.4 MG SL tablet Place 1 tablet (0.4 mg total) under the tongue every 5 (five) minutes as needed for chest pain. 25 tablet 4  . omeprazole (PRILOSEC) 20 MG capsule TAKE ONE CAPSULE BY MOUTH DAILY. (Patient taking differently: TAKE 20 MG BY  MOUTH DAILY.) 90 capsule 1  . predniSONE (DELTASONE) 5 MG tablet Label  & dispense according to the schedule below. 0 refills. Take '60mg'$  PO daily x 5 days, then '50mg'$  x 5 days, then '40mg'$  x 5 days, then '30mg'$  x 5 days, then '20mg'$  x 5 days, then '10mg'$  x 5 days, then '5mg'$  x 5 days, then 2.'5mg'$  x 5 days, then 1 mg x 5 days, then 0.'5mg'$  x 5 days and stop. 100 tablet 0  . simvastatin (ZOCOR) 80 MG tablet Take 0.5 tablets (40 mg total) by mouth at bedtime. 15 tablet 5  . VENTOLIN HFA 108 (90 Base) MCG/ACT inhaler INHALE 2 PUFFS BY MOUTH EVERY 4 TO 6 HOURS AS NEEDED FOR WHEEZING. 18 g 5   No current facility-administered medications for this visit.    SURGICAL HISTORY:  Past Surgical History  Procedure Laterality Date  . Colonoscopy  2002    polypectomy-patient denies  . Laparoscopic cholecystectomy  12/2009  . Abdominal aortic endovascular stent graft N/A 12/11/2012    Procedure: ABDOMINAL AORTIC ENDOVASCULAR STENT GRAFT;  Surgeon: Mal Misty, MD;  Location: [place];  Service: Vascular;  Laterality: N/A;  Ultrasound guided; [place]  . Abdominal aortic aneurysm repair  11/2012  . Total hip arthroplasty Left 01/21/2013    Procedure: TOTAL HIP ARTHROPLASTY ANTERIOR APPROACH;  Surgeon: Mauri Pole, MD;  Location: [facility];  Service: Orthopedics;  Laterality: Left;  . Joint replacement    . Coronary artery bypass graft  01/09/1995    "CABG X3"  . Cardiac catheterization  01/08/1995  . Coronary angioplasty with stent placement  03/18/2014    "1"  . Coronary angioplasty with stent placement  03/24/2014    "1"  . Left and right heart catheterization with coronary/graft angiogram N/A 03/18/2014    Procedure: LEFT AND RIGHT HEART CATHETERIZATION WITH Beatrix Fetters;  Surgeon: Blane Ohara, MD;  Location: [facility] CATH LAB;  Service: Cardiovascular;  Laterality: N/A;  . Percutaneous coronary stent intervention (pci-s)  03/18/2014    Procedure: PERCUTANEOUS CORONARY STENT INTERVENTION (PCI-S);  Surgeon: Blane Ohara, MD;  Location: [facility] CATH LAB;  Service: Cardiovascular;;  . Percutaneous coronary stent intervention (pci-s) N/A 03/24/2014    Procedure: PERCUTANEOUS CORONARY STENT INTERVENTION (PCI-S);  Surgeon: Blane Ohara, MD;  Location: [facility] CATH LAB;  Service: Cardiovascular;  Laterality: N/A;  . Video bronchoscopy N/A 11/17/2015    Procedure: VIDEO BRONCHOSCOPY WITH FLUORO;  Surgeon: Rigoberto Noel, MD;  Location: [place];  Service: Cardiopulmonary;  Laterality: N/A;    REVIEW OF SYSTEMS:  Constitutional: positive for fatigue Eyes: negative Ears, nose, mouth, throat, and face: negative Respiratory: positive for dyspnea on exertion Cardiovascular: negative Gastrointestinal: negative Genitourinary:negative Integument/breast: negative Hematologic/lymphatic: negative Musculoskeletal:negative Neurological: negative Behavioral/Psych: negative Endocrine: negative Allergic/Immunologic: negative   PHYSICAL EXAMINATION: General appearance: alert, cooperative and no distress Head: Normocephalic,  without obvious abnormality, atraumatic Neck: no adenopathy, no JVD, supple, symmetrical, trachea midline and thyroid not enlarged, symmetric, no tenderness/mass/nodules Lymph nodes: Cervical, supraclavicular, and axillary nodes normal. Resp: clear to auscultation bilaterally Back: symmetric, no curvature. ROM normal. No CVA tenderness. Cardio: regular rate and rhythm, S1, S2 normal, no murmur, click, rub or gallop GI: soft, non-tender; bowel sounds normal; no masses,  no organomegaly Extremities: extremities normal, atraumatic, no cyanosis or edema Neurologic: Alert and oriented X 3, normal strength and tone. Normal symmetric reflexes. Normal coordination and gait  ECOG PERFORMANCE STATUS: 1 - Symptomatic but completely ambulatory  Blood pressure 119/50, pulse 68, temperature 97.7 F (36.5 C), temperature source Oral, resp. rate 18, height '5\' 10"'$  (1.778 m), weight 176 lb 8 oz (80.06 kg), SpO2 99  %.  LABORATORY DATA: Lab Results  Component Value Date   WBC 19.0* 03/05/2016   HGB 11.9* 03/05/2016   HCT 37.0* 03/05/2016   MCV 90.9 03/05/2016   PLT 195 03/05/2016      Chemistry      Component Value Date/Time   NA 137 03/05/2016 1132   NA 138 02/29/2016 1218   NA 138 02/15/2016 0843   K 4.9 03/05/2016 1132   K 5.0 02/29/2016 1218   CL 96 02/29/2016 1218   CO2 24 03/05/2016 1132   CO2 36* 02/29/2016 1218   BUN 35.9* 03/05/2016 1132   BUN 30* 02/29/2016 1218   BUN 13 02/15/2016 0843   CREATININE 1.5* 03/05/2016 1132   CREATININE 1.40 02/29/2016 1218   CREATININE 1.63* 08/18/2014 0758      Component Value Date/Time   CALCIUM 9.4 03/05/2016 1132   CALCIUM 9.6 02/29/2016 1218   ALKPHOS 97 03/05/2016 1132   ALKPHOS 85 02/29/2016 1218   AST 21 03/05/2016 1132   AST 22 02/29/2016 1218   ALT 32 03/05/2016 1132   ALT 33 02/29/2016 1218   BILITOT 0.37 03/05/2016 1132   BILITOT 0.5 02/29/2016 1218   BILITOT 0.6 08/23/2015 0853       RADIOGRAPHIC STUDIES: Dg Chest 2 View  02/29/2016  CLINICAL DATA:  Pneumonia.  Pleural effusion. EXAM: CHEST  2 VIEW COMPARISON:  CT 02/24/2016.  Chest x-ray 02/23/2016. FINDINGS: Prior CABG. Heart size stable. Stable postradiation changes/infiltrate right upper lobe. Mild bilateral pleural effusions cannot be excluded. No pneumothorax. Degenerative changes thoracic spine. IMPRESSION: 1. Stable right upper lobe postradiation changes and/or infiltrate.Tiny bilateral pleural effusions cannot be excluded. 2. Prior CABG.  Stable cardiomegaly. Electronically Signed   By: Marcello Moores  Register   On: 02/29/2016 12:53   Dg Chest 2 View  02/21/2016  CLINICAL DATA:  Stage IV lung malignancy status post chemotherapy and radiation; recent episode of pneumonia; increase shortness of breath and orthopnea. EXAM: CHEST  2 VIEW COMPARISON:  PA and lateral chest x-ray of February 16, 2016 FINDINGS: The right upper lobe density has become more confluent. The right lower  lung is adequately inflated. There is a small right pleural effusion which is unchanged. The left lung is clear. There are post CABG changes. There is no cardiomegaly or pulmonary edema. The bony thorax exhibits no acute abnormality. IMPRESSION: Worsening of alveolar opacity in the right upper lobe which may reflect pneumonia either typical or postobstructive. Lymphangitic spread of malignancy could be present as well but is fell less likely than progressive pneumonia. There is no CHF. There is a small right pleural effusion. Electronically Signed   By: David  Martinique M.D.   On: 02/21/2016 11:02   Dg Chest 2  View  02/16/2016  CLINICAL DATA:  Shortness of breath with exertion, fever, cough EXAM: CHEST  2 VIEW COMPARISON:  Chest x-ray of 02/05/2016 FINDINGS: There is parenchymal opacity within the right upper lobe consistent with pneumonia. The left lung is clear. Mediastinal hilar contours are unremarkable. Followup PA and lateral chest X-ray is recommended in 3-4 weeks following trial of antibiotic therapy to ensure resolution and exclude underlying malignancy. The heart is within normal limits in size. No bony abnormality is seen. Median sternotomy sutures are noted from CABG. IMPRESSION: Right upper lobe opacity most consistent with pneumonia. Followup PA and lateral chest X-ray is recommended in 3-4 weeks following trial of antibiotic therapy to ensure resolution and exclude underlying malignancy. Electronically Signed   By: Ivar Drape M.D.   On: 02/16/2016 15:19   Ct Chest W Contrast  02/21/2016  CLINICAL DATA:  75 year old with current history of squamous cell carcinoma of the right upper lobe, treated with radiation therapy and chemotherapy, completed earlier this month. Patient was admitted 02/05/2016 with community acquired pneumonia. He presents again today with cough and fever. EXAM: CT CHEST WITH CONTRAST TECHNIQUE: Multidetector CT imaging of the chest was performed during intravenous contrast  administration. CONTRAST:  35m ISOVUE-300 IOPAMIDOL (ISOVUE-300) INJECTION 61% IV. COMPARISON:  CT CT 11/25/2015.  CTA chest 11/11/2015, 03/10/2012. FINDINGS: Cardiovascular: Normal heart size. Extensive 3 vessel coronary atherosclerosis. Prior sternotomy for CABG. Visualized portions of the coronary grafts patent. No pericardial effusion moderate to severe atherosclerosis involving the thoracic and upper abdominal aorta without aneurysm. Atherosclerosis involving the proximal great vessels without visible stenosis. Mediastinum/Lymph Nodes: Interval marked decrease in size of the pathologic low right paratracheal mediastinal lymph node (station 4R) since the PET-CT, current measurement approximating 1.1 x 1.3 cm (previously 2.0 x 1.8 cm). The hypermetabolic right axillary lymph node on the PET-CT is unchanged to perhaps slightly decreased in size. No new or enlarging lymphadenopathy. Small hiatal hernia. Thyroid gland unremarkable. Lungs/Pleura: Since the PET-CT, marked reduction in size of the right upper lobe lung mass, now measuring approximately 2.2 x 1.9 cm (series 5, image 37), previously 4.3 x 3.4 cm. Interval development of confluent airspace opacities with air bronchograms in the right upper lobe and in the superior segment right lower lobe, associated with a large right pleural effusion. Patchy airspace opacities are present medially in the left upper lobe. Small left pleural effusion. Upper abdomen: Surgically absent gallbladder. The he visualized proximal portion of the abdominal aortic stent graft is unremarkable. No acute findings. 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 No evidence of osseous metastatic disease. IMPRESSION: 1. Confluent airspace pneumonia involving the right upper lobe and the superior segment right lower lobe, with patchy pneumonia involving the left upper lobe. The right upper lobe pneumonia is superimposed upon post radiation changes.  2. Large right pleural effusion and small left pleural effusion. 3. Interval marked reduction in size of the right upper lobe lung cancer since the prior PET-CT 11/25/2015. Measurements are given above. 4. Interval reduction in size of the pathologic station 4R mediastinal lymph node since the prior PET-CT. No new or enlarging lymphadenopathy. Electronically Signed   By: TEvangeline DakinM.D.   On: 02/21/2016 19:10   Ct Angio Chest Pe W Or Wo Contrast  02/24/2016  CLINICAL DATA:  75year old male with tachycardia EXAM: CT ANGIOGRAPHY CHEST WITH CONTRAST TECHNIQUE: Multidetector CT imaging of the chest was performed using the standard protocol during bolus administration of intravenous contrast. Multiplanar CT image reconstructions  and MIPs were obtained to evaluate the vascular anatomy. CONTRAST:  100 mL Isovue 370 COMPARISON:  Chest x-ray 02/23/2016; recent CT scan of the chest 02/21/2016; PET-CT 11/25/2015 FINDINGS: Mediastinum: No mediastinal mass or suspicious adenopathy. Small hiatal hernia. Unremarkable visualized thyroid gland. Heart/Vascular: Adequate opacification of the pulmonary arteries to the proximal subsegmental level. No evidence of pulmonary embolus. Patient is status post coronary artery bypass grafting with a patent LIMA to distal LAD graft, and patent aortic to posterior descending coronary artery. A metallic stent is present in the left main coronary artery. The heart is normal in size. No pericardial effusion. No aortic aneurysm. Conventional 3 vessel arch anatomy. Lungs/Pleura: Persistent but improving right layering pleural effusion. Trace left effusion is also slightly smaller. Similar appearance of architectural distortion and diffuse predominantly interstitial opacities throughout the right upper lobe and superior segment of the right lower lobe. Findings remain most consistent with changes of prior external beam radiation therapy. Similar changes are present in the medial aspect of the  lingula. No new focal airspace consolidation, or mass. The soft tissue nodule in the medial aspect of the right upper lobe remains essentially unchanged at 2.0 x 1.8 cm. Bones/Soft Tissues: No acute fracture or aggressive appearing lytic or blastic osseous lesion. Upper Abdomen: Surgical changes of prior cholecystectomy. Otherwise, no acute findings in the upper abdomen. Review of the MIP images confirms the above findings. IMPRESSION: 1. Negative for acute pulmonary embolus, pneumonia or other acute cardiopulmonary process. 2. Similar appearance of post radiation changes in the right upper lobe, superior segment of the right lower lobe and medial aspect of the left upper lobe. 3. Decreasing (compared to 02/21/2016) bilateral pleural effusions now small on the right and trace on the left. 4. Stable right upper lobe pulmonary nodule status post treatment with radiation therapy. Electronically Signed   By: Jacqulynn Cadet M.D.   On: 02/24/2016 14:46   Dg Chest Port 1 View  02/23/2016  CLINICAL DATA:  Shortness of breath. EXAM: PORTABLE CHEST 1 VIEW COMPARISON:  02/22/2016. FINDINGS: Prior CABG. Heart size normal. Right upper lobe infiltrate and atelectasis again noted. No interim change. Continued follow-up exam suggested demonstrate clearing . No pleural effusion or pneumothorax. IMPRESSION: 1.  Prior CABG.  Heart size normal. 2. Right upper lobe infiltrate and atelectasis again noted without significant change. Continued follow-up exams to demonstrate clearing suggested. Electronically Signed   By: Marcello Moores  Register   On: 02/23/2016 06:57   Dg Chest Port 1 View  02/22/2016  CLINICAL DATA:  Pleural effusion, shortness of Breath EXAM: PORTABLE CHEST 1 VIEW COMPARISON:  Chest x-ray and CT 02/21/2016 FINDINGS: Consolidation in the right upper lobe compatible with pneumonia previously seen effusion by CT not definitively visualized on this single view portable chest x-ray. No focal opacity on the left. Prior CABG.  Heart is normal size. No acute bony abnormality. IMPRESSION: Continued consolidation in the right upper lobe most compatible with pneumonia. Previously seen pleural effusions cannot be visualized on this portable study. Electronically Signed   By: Rolm Baptise M.D.   On: 02/22/2016 11:53    ASSESSMENT AND PLAN: This is a very pleasant 75 years old white male recently diagnosed with a stage IIIA non-small cell lung cancer status post a course of concurrent chemoradiation with weekly carboplatin and paclitaxel status post 5 cycles. He tolerated his treatment well except for the recent admission to the hospital with recurrent unexplained fever. There was a suspicious of pneumonia in addition to congestive heart failure and  COPD exacerbation. He is feeling much better today. Previous imaging studies showed no evidence for disease progression with some improvement of his disease. I discussed the scan results with the patient. I recommended for him to continue on observation for now with repeat CT scan of the chest in 2 months for reevaluation of his disease. The patient has a lot of issues recently that I would not recommend for him to proceed with consolidation chemotherapy at this point. The patient agreed to the current plan. The patient was advised to call immediately if he has any concerning symptoms in the interval. The patient voices understanding of current disease status and treatment options and is in agreement with the current care plan.  All questions were answered. The patient knows to call the clinic with any problems, questions or concerns. We can certainly see the patient much sooner if necessary.  Disclaimer: This note was dictated with voice recognition software. Similar sounding words can inadvertently be transcribed and may not be corrected upon review.

## 2016-03-15 ENCOUNTER — Telehealth: Payer: Self-pay | Admitting: Internal Medicine

## 2016-03-15 NOTE — Telephone Encounter (Signed)
lef msg confirming 9/14 & 9/20 apt times

## 2016-03-19 ENCOUNTER — Telehealth: Payer: Self-pay | Admitting: Family Medicine

## 2016-03-19 ENCOUNTER — Other Ambulatory Visit: Payer: Self-pay

## 2016-03-19 MED ORDER — FUROSEMIDE 40 MG PO TABS: 40.0000 mg | ORAL_TABLET | Freq: Every day | ORAL | 11 refills | Status: DC

## 2016-03-19 MED ORDER — FUROSEMIDE 40 MG PO TABS
40.0000 mg | ORAL_TABLET | Freq: Every day | ORAL | 11 refills | Status: DC
Start: 2016-03-19 — End: 2017-03-25

## 2016-03-19 NOTE — Telephone Encounter (Signed)
Left message informing patient refills on medication were sent into pharmacy.

## 2016-03-19 NOTE — Telephone Encounter (Signed)
May ref times 12

## 2016-03-19 NOTE — Telephone Encounter (Signed)
Patient came in requesting refill on furosemide 40 mg to be called into [facility]

## 2016-03-21 ENCOUNTER — Telehealth: Payer: Self-pay | Admitting: Pulmonary Disease

## 2016-03-21 MED ORDER — FLUTICASONE FUROATE-VILANTEROL 100-25 MCG/INH IN AEPB: 1.0000 | INHALATION_SPRAY | Freq: Every day | RESPIRATORY_TRACT | 0 refills | Status: DC

## 2016-03-21 NOTE — Telephone Encounter (Signed)
Spoke with pt. He was given samples of Breo 200. Pt is currently on Breo 100. He wants samples of Breo 100. These have been given to the pt. Nothing further was needed.

## 2016-03-22 ENCOUNTER — Encounter: Payer: Self-pay | Admitting: *Deleted

## 2016-03-22 NOTE — Progress Notes (Signed)
Oncology Nurse Navigator Documentation  Oncology Nurse Navigator Flowsheets 03/22/2016  Navigator Encounter Type Telephone  Telephone Outgoing Call  Treatment Phase Post-Tx Follow-up  Barriers/Navigation Needs Education  Interventions Education Method  Education Method Verbal  Acuity Level 1  Acuity Level 1 Minimal follow up required  Time Spent with Patient 15   Paul Sandoval called today.  I called him back.  He states that he feels fine but had a temp of 100.1 today.  He has no complaints of cough or nausea.  I listened as he explained he has no other symptoms to cause fever.  I will updated Dr. Julien Nordmann for further instructions.  I did tell Paul Sandoval to go to ED if he has higher fever or if he felt bad.

## 2016-03-23 ENCOUNTER — Ambulatory Visit: Payer: Medicare Other | Admitting: Cardiovascular Disease

## 2016-03-28 ENCOUNTER — Ambulatory Visit (HOSPITAL_BASED_OUTPATIENT_CLINIC_OR_DEPARTMENT_OTHER): Payer: Medicare Other | Admitting: Nurse Practitioner

## 2016-03-28 ENCOUNTER — Telehealth: Payer: Self-pay | Admitting: Pulmonary Disease

## 2016-03-28 ENCOUNTER — Telehealth: Payer: Self-pay | Admitting: Nurse Practitioner

## 2016-03-28 ENCOUNTER — Encounter: Payer: Self-pay | Admitting: *Deleted

## 2016-03-28 ENCOUNTER — Encounter: Payer: Self-pay | Admitting: Nurse Practitioner

## 2016-03-28 ENCOUNTER — Other Ambulatory Visit (HOSPITAL_BASED_OUTPATIENT_CLINIC_OR_DEPARTMENT_OTHER): Payer: Medicare Other

## 2016-03-28 ENCOUNTER — Telehealth: Payer: Self-pay | Admitting: *Deleted

## 2016-03-28 VITALS — BP 114/42 | HR 91 | Temp 98.0°F | Resp 18 | Ht 70.0 in | Wt 176.6 lb

## 2016-03-28 DIAGNOSIS — E46 Unspecified protein-calorie malnutrition: Secondary | ICD-10-CM | POA: Diagnosis not present

## 2016-03-28 DIAGNOSIS — C3411 Malignant neoplasm of upper lobe, right bronchus or lung: Secondary | ICD-10-CM

## 2016-03-28 DIAGNOSIS — R509 Fever, unspecified: Secondary | ICD-10-CM

## 2016-03-28 DIAGNOSIS — E8809 Other disorders of plasma-protein metabolism, not elsewhere classified: Secondary | ICD-10-CM

## 2016-03-28 LAB — URINALYSIS, MICROSCOPIC - CHCC
BILIRUBIN (URINE): NEGATIVE
Blood: NEGATIVE
Glucose: NEGATIVE mg/dL
Ketones: NEGATIVE mg/dL
NITRITE: NEGATIVE
Protein: NEGATIVE mg/dL
RBC / HPF: NEGATIVE (ref 0–2)
Specific Gravity, Urine: 1.01 (ref 1.003–1.035)
Urobilinogen, UR: 0.2 mg/dL (ref 0.2–1)
pH: 6 (ref 4.6–8.0)

## 2016-03-28 LAB — CBC WITH DIFFERENTIAL/PLATELET
BASO%: 0.1 % (ref 0.0–2.0)
BASOS ABS: 0 10*3/uL (ref 0.0–0.1)
EOS%: 0.7 % (ref 0.0–7.0)
Eosinophils Absolute: 0 10*3/uL (ref 0.0–0.5)
HCT: 35.6 % — ABNORMAL LOW (ref 38.4–49.9)
HEMOGLOBIN: 11.4 g/dL — AB (ref 13.0–17.1)
LYMPH%: 4.2 % — ABNORMAL LOW (ref 14.0–49.0)
MCH: 29.2 pg (ref 27.2–33.4)
MCHC: 32.1 g/dL (ref 32.0–36.0)
MCV: 91 fL (ref 79.3–98.0)
MONO#: 0.3 10*3/uL (ref 0.1–0.9)
MONO%: 4.9 % (ref 0.0–14.0)
NEUT#: 5.5 10*3/uL (ref 1.5–6.5)
NEUT%: 90.1 % — ABNORMAL HIGH (ref 39.0–75.0)
Platelets: 85 10*3/uL — ABNORMAL LOW (ref 140–400)
RBC: 3.91 10*6/uL — ABNORMAL LOW (ref 4.20–5.82)
RDW: 19 % — AB (ref 11.0–14.6)
WBC: 6.1 10*3/uL (ref 4.0–10.3)
lymph#: 0.3 10*3/uL — ABNORMAL LOW (ref 0.9–3.3)

## 2016-03-28 LAB — COMPREHENSIVE METABOLIC PANEL
ALBUMIN: 2.8 g/dL — AB (ref 3.5–5.0)
ALK PHOS: 81 U/L (ref 40–150)
ALT: 31 U/L (ref 0–55)
AST: 23 U/L (ref 5–34)
Anion Gap: 9 mEq/L (ref 3–11)
BUN: 22.2 mg/dL (ref 7.0–26.0)
CHLORIDE: 102 meq/L (ref 98–109)
CO2: 23 meq/L (ref 22–29)
Calcium: 9.2 mg/dL (ref 8.4–10.4)
Creatinine: 1.6 mg/dL — ABNORMAL HIGH (ref 0.7–1.3)
EGFR: 42 mL/min/{1.73_m2} — ABNORMAL LOW (ref >90)
GLUCOSE: 145 mg/dL — AB (ref 70–140)
POTASSIUM: 4 meq/L (ref 3.5–5.1)
SODIUM: 134 meq/L — AB (ref 136–145)
Total Bilirubin: 0.83 mg/dL (ref 0.20–1.20)
Total Protein: 6.7 g/dL (ref 6.4–8.3)

## 2016-03-28 NOTE — Telephone Encounter (Signed)
[facility], office is closed.  Wcb.

## 2016-03-28 NOTE — Assessment & Plan Note (Signed)
Albumin remains low.  Patient was encouraged as protein in his diet is much as possible.

## 2016-03-28 NOTE — Progress Notes (Signed)
SYMPTOM MANAGEMENT CLINIC    Chief Complaint: Fever  HPI:  Paul Sandoval 75 y.o. male diagnosed with lung cancer.  Patient is status post chemotherapy and radiation treatments.  Currently undergoing observation only.  Patient presented to the [facility] today with complaint of intermittent fever.  He has no other new symptoms whatsoever.   Oncology History   Patient presented to ED with hemoptysis.  Work up showed right lung mass.  Primary cancer of right upper lobe of lung ([place])   Staging form: Lung, AJCC 7th Edition     Clinical stage from 12/01/2015: Stage IIIA (T2b, N2, M0) - Signed by Curt Bears, MD on 12/01/2015       Primary cancer of right upper lobe of lung ([place])   11/11/2015 Imaging    CTA . 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 This mass abuts the anterior visceral pleura along a broad base. No pleural effusion. 3. Right hilar and subcarinal, right paratracheal and lef     11/17/2015 Pathology Results    BRONCHIAL WASHING (SPECIMEN 2 OF 2 COLLECTED 11-17-2015) MALIGNANT CELLS PRESENT, CONSISTENT WITH NON-SMALL CELL CARCINOMA     11/17/2015 Surgery    Bronchoscopy     11/24/2015 Initial Diagnosis    Primary cancer of right upper lobe of lung ([place])     11/25/2015 Imaging    PET IMPRESSION: 1. Hypermetabolic RIGHT upper lobe mass abutting the pleural surface consists with bronchogenic carcinoma. 2. Hypermetabolic RIGHT hilar and RIGHT lower paratracheal metastatic adenopathy.     12/02/2015 -  Radiation Therapy    SIM     12/08/2015 Imaging    MRI Brain No metastatic disease is observed     12/12/2015 -  Chemotherapy    1st chemotherapy Taxol/Carbo      Review of Systems  Constitutional: Positive for fever. Negative for chills.  All other systems reviewed and are negative.   Past Medical History:  Diagnosis Date  . AAA (abdominal aortic aneurysm) ([place]) 2010   4.4 cm 08/2008;4.44 in 7/10 and  4.65 in 08/2009; 4.8 by CT in 11/2009; 4.3 by ultrasound in 08/2010  . Arteriosclerotic cardiovascular disease (ASCVD) 1996   CABG-1996  . Arthritis    "fingers" (03/18/2014)  . CAD (coronary artery disease)    03/18/14:  PCI with DES to distal left main. 7/29: DES to the SVG to Diag  . Cancer ([place])    Upper right lobe lung cancer  . Cardiomyopathy, ischemic    Echo 03/17/14: EF 45-50%  . Chronic bronchitis ([place])   . Chronic rhinitis   . Colonic polyp 2002   polypectomy in 2002  . COPD (chronic obstructive pulmonary disease) ([place])   . Diverticulosis   . ED (erectile dysfunction)   . Encounter for antineoplastic chemotherapy 12/19/2015  . GERD (gastroesophageal reflux disease)   . Hyperlipidemia   . Hypertension   . IFG (impaired fasting glucose)   . Myocardial infarction [facility])    "told h/o silent MI sometime before 1996"  . Pneumonia ~ 2001; ~ 2005  . Right bundle branch block   . Tobacco abuse, in remission    40 pack year total consumption; discontinued in 1996    Past Surgical History:  Procedure Laterality Date  . ABDOMINAL AORTIC ANEURYSM REPAIR  11/2012  . ABDOMINAL AORTIC ENDOVASCULAR STENT GRAFT N/A 12/11/2012   Procedure: ABDOMINAL AORTIC ENDOVASCULAR STENT GRAFT;  Surgeon: Mal Misty, MD;  Location: [facility]  OR;  Service: Vascular;  Laterality: N/A;  Ultrasound guided; [place]  . CARDIAC CATHETERIZATION  01/08/1995  . COLONOSCOPY  2002   polypectomy-patient denies  . CORONARY ANGIOPLASTY WITH STENT PLACEMENT  03/18/2014   "1"  . CORONARY ANGIOPLASTY WITH STENT PLACEMENT  03/24/2014   "1"  . CORONARY ARTERY BYPASS GRAFT  01/09/1995   "CABG X3"  . JOINT REPLACEMENT    . LAPAROSCOPIC CHOLECYSTECTOMY  12/2009  . LEFT AND RIGHT HEART CATHETERIZATION WITH CORONARY/GRAFT ANGIOGRAM N/A 03/18/2014   Procedure: LEFT AND RIGHT HEART CATHETERIZATION WITH Beatrix Fetters;  Surgeon: Blane Ohara, MD;  Location: [facility] CATH LAB;  Service: Cardiovascular;  Laterality: N/A;  .  PERCUTANEOUS CORONARY STENT INTERVENTION (PCI-S)  03/18/2014   Procedure: PERCUTANEOUS CORONARY STENT INTERVENTION (PCI-S);  Surgeon: Blane Ohara, MD;  Location: [facility] CATH LAB;  Service: Cardiovascular;;  . PERCUTANEOUS CORONARY STENT INTERVENTION (PCI-S) N/A 03/24/2014   Procedure: PERCUTANEOUS CORONARY STENT INTERVENTION (PCI-S);  Surgeon: Blane Ohara, MD;  Location: [facility] CATH LAB;  Service: Cardiovascular;  Laterality: N/A;  . TOTAL HIP ARTHROPLASTY Left 01/21/2013   Procedure: TOTAL HIP ARTHROPLASTY ANTERIOR APPROACH;  Surgeon: Mauri Pole, MD;  Location: [place];  Service: Orthopedics;  Laterality: Left;  Marland Kitchen VIDEO BRONCHOSCOPY N/A 11/17/2015   Procedure: VIDEO BRONCHOSCOPY WITH FLUORO;  Surgeon: Rigoberto Noel, MD;  Location: [place];  Service: Cardiopulmonary;  Laterality: N/A;    has Elevated lipids; Essential hypertension; Arteriosclerotic cardiovascular disease (ASCVD); GERD (gastroesophageal reflux disease); Cerebrovascular disease; AAA (abdominal aortic aneurysm) ([place]); CAD (coronary artery disease); 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[place]); Right carotid bruit; Other and unspecified angina pectoris; Cardiomyopathy, ischemic:  EF 45-50% echo 03/17/14; Thrombocytopenia ([place]); Chronic obstructive airway disease with asthma (HCC); CKD (chronic kidney disease) stage 3, GFR 30-59 ml/min; Primary cancer of right upper lobe of lung ([place]); AAA (abdominal aortic aneurysm) without rupture ([place]); Acute on chronic renal failure ([place]); HTN (hypertension); Peripheral edema; 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    is allergic to neomycin.    Medication List       Accurate as of 03/28/16  2:18 PM. Always use your most recent med list.            acetaminophen 500 MG tablet Commonly known as:  TYLENOL Take 500 mg by mouth every 6 (six) hours as needed for mild pain.   acetaZOLAMIDE 500 MG capsule Commonly known as:  DIAMOX SEQUELS Take 1 capsule (500 mg total) by mouth daily. At 4pm   aspirin EC 81 MG tablet Take 81 mg by mouth daily.   CENTRUM SILVER ADULT 50+ Tabs Take 1 tablet by mouth daily.   clopidogrel 75 MG tablet Commonly known as:  PLAVIX Take 1 tablet (75 mg total) by mouth daily. Reported on 12/02/2015   fluticasone furoate-vilanterol 100-25 MCG/INH Aepb Commonly known as:  BREO ELLIPTA Inhale 1 puff into the lungs daily.   fluticasone furoate-vilanterol 100-25 MCG/INH Aepb Commonly known as:  BREO ELLIPTA Inhale 1 puff into the lungs daily.   furosemide 40 MG tablet Commonly known as:  LASIX Take 1 tablet (40 mg total) by mouth daily.   Iron 28 MG Tabs Take 28 mg by mouth daily.   Metoprolol Tartrate 75 MG Tabs Take 75 mg by mouth 2 (two) times daily. Dispense as needed 1  month supply only   nitroGLYCERIN 0.4 MG SL tablet Commonly known as:  NITROSTAT Place 1 tablet (0.4 mg total) under the tongue every 5 (five) minutes as needed for chest pain.   omeprazole 20 MG capsule Commonly known as:  PRILOSEC TAKE ONE CAPSULE BY MOUTH DAILY.   predniSONE 5 MG tablet Commonly known as:  DELTASONE Label  & dispense according to the schedule below. 0 refills. Take 89m PO daily x 5 days, then 534mx 5 days, then 4054m 5 days, then 21m7m5 days, then 20mg84m days, then 10mg 57mdays, then 5mg x 36mays, then 2.5mg x 545mys, then 1 mg x 5 days, then 0.5mg x 5 64ms and stop.   simvastatin 80 MG tablet Commonly known as:  ZOCOR Take 0.5 tablets (40 mg total) by mouth at bedtime.   VENTOLIN HFA 108 (90 Base) MCG/ACT inhaler Generic drug:  albuterol INHALE 2 PUFFS BY MOUTH EVERY 4 TO 6 HOURS AS NEEDED FOR WHEEZING.        PHYSICAL EXAMINATION  Oncology Vitals 03/28/2016 03/13/2016  Height 178 cm 178 cm   Weight 80.105 kg 80.06 kg  Weight (lbs) 176 lbs 10 oz 176 lbs 8 oz  BMI (kg/m2) 25.34 kg/m2 25.33 kg/m2  Temp 98 97.7  Pulse 91 68  Resp 18 18  SpO2 96 99  BSA (m2) 1.99 m2 1.99 m2   BP Readings from Last 2 Encounters:  03/28/16 (!) 114/42  03/13/16 (!) 119/50    Physical Exam  Constitutional: He is oriented to person, place, and time and well-developed, well-nourished, and in no distress.  HENT:  Head: Normocephalic and atraumatic.  Mouth/Throat: Oropharynx is clear and moist.  Eyes: Conjunctivae and EOM are normal. Pupils are equal, round, and reactive to light. Right eye exhibits no discharge. Left eye exhibits no discharge. No scleral icterus.  Neck: Normal range of motion. Neck supple. No JVD present. No tracheal deviation present. No thyromegaly present.  Cardiovascular: Normal rate, regular rhythm, normal heart sounds and intact distal pulses.   Pulmonary/Chest: Effort normal. No respiratory distress. He has wheezes. He has no rales. He exhibits no tenderness.  Abdominal: Soft. Bowel sounds are normal. He exhibits no distension and no mass. There is no tenderness. There is no rebound and no guarding.  Musculoskeletal: Normal range of motion. He exhibits no edema, tenderness or deformity.  Lymphadenopathy:    He has no cervical adenopathy.  Neurological: He is alert and oriented to person, place, and time. Gait normal.  Skin: Skin is warm and dry. No rash noted. No erythema. No pallor.  Psychiatric: Affect normal.  Nursing note and vitals reviewed.   LABORATORY DATA:. Appointment on 03/28/2016  Component Date Value Ref Range Status  . WBC 03/28/2016 6.1  4.0 - 10.3 10e3/uL Final  . NEUT# 03/28/2016 5.5  1.5 - 6.5 10e3/uL Final  . HGB 03/28/2016 11.4* 13.0 - 17.1 g/dL Final  . HCT 03/28/2016 35.6* 38.4 - 49.9 % Final  . Platelets 03/28/2016 85* 140 - 400 10e3/uL Final  . MCV 03/28/2016 91.0  79.3 - 98.0 fL Final  . MCH 03/28/2016 29.2  27.2 - 33.4 pg Final  . MCHC  03/28/2016 32.1  32.0 - 36.0 g/dL Final  . RBC 03/28/2016 3.91* 4.20 - 5.82 10e6/uL Final  . RDW 03/28/2016 19.0* 11.0 - 14.6 % Final  . lymph# 03/28/2016 0.3* 0.9 - 3.3 10e3/uL Final  . MONO# 03/28/2016 0.3  0.1 - 0.9 10e3/uL Final  .  Eosinophils Absolute 03/28/2016 0.0  0.0 - 0.5 10e3/uL Final  . Basophils Absolute 03/28/2016 0.0  0.0 - 0.1 10e3/uL Final  . NEUT% 03/28/2016 90.1* 39.0 - 75.0 % Final  . LYMPH% 03/28/2016 4.2* 14.0 - 49.0 % Final  . MONO% 03/28/2016 4.9  0.0 - 14.0 % Final  . EOS% 03/28/2016 0.7  0.0 - 7.0 % Final  . BASO% 03/28/2016 0.1  0.0 - 2.0 % Final  . Sodium 03/28/2016 134* 136 - 145 mEq/L Final  . Potassium 03/28/2016 4.0  3.5 - 5.1 mEq/L Final  . Chloride 03/28/2016 102  98 - 109 mEq/L Final  . CO2 03/28/2016 23  22 - 29 mEq/L Final  . Glucose 03/28/2016 145* 70 - 140 mg/dl Final  . BUN 03/28/2016 22.2  7.0 - 26.0 mg/dL Final  . Creatinine 03/28/2016 1.6* 0.7 - 1.3 mg/dL Final  . Total Bilirubin 03/28/2016 0.83  0.20 - 1.20 mg/dL Final  . Alkaline Phosphatase 03/28/2016 81  40 - 150 U/L Final  . AST 03/28/2016 23  5 - 34 U/L Final  . ALT 03/28/2016 31  0 - 55 U/L Final  . Total Protein 03/28/2016 6.7  6.4 - 8.3 g/dL Final  . Albumin 03/28/2016 2.8* 3.5 - 5.0 g/dL Final  . Calcium 03/28/2016 9.2  8.4 - 10.4 mg/dL Final  . Anion Gap 03/28/2016 9  3 - 11 mEq/L Final  . EGFR 03/28/2016 42* >90 ml/min/1.73 m2 Final  . Glucose 03/28/2016 Negative  Negative mg/dL Final  . Bilirubin (Urine) 03/28/2016 Negative  Negative Final  . Ketones 03/28/2016 Negative  Negative mg/dL Final  . Specific Gravity, Urine 03/28/2016 1.010  1.003 - 1.035 Final  . Blood 03/28/2016 Negative  Negative Final  . pH 03/28/2016 6.0  4.6 - 8.0 Final  . Protein 03/28/2016 Negative  Negative- <30 mg/dL Final  . Urobilinogen, UR 03/28/2016 0.2  0.2 - 1 mg/dL Final  . Nitrite 03/28/2016 Negative  Negative Final  . Leukocyte Esterase 03/28/2016 Trace  Negative Final  . RBC / HPF 03/28/2016  Negative  0 - 2 Final  . WBC, UA 03/28/2016 0-2  0 - 2 Final  . Bacteria, UA 03/28/2016 Trace  Negative- Trace Final  . Casts 03/28/2016 Hyaline  Negative Final  . Epithelial Cells 03/28/2016 Occasional  Negative- Few Final    RADIOGRAPHIC STUDIES: No results found.  ASSESSMENT/PLAN:    Primary cancer of right upper lobe of lung ([place]) Patient is status post carboplatin/Taxol chemotherapy last received on 01/24/2016.  Patient is also completed all radiation treatments as well.  He is currently undergoing observation only.  Patient is scheduled for labs, visit, only on 05/10/2016.  He is scheduled for follow-up visit with Dr. Julien Nordmann on 05/16/2016.  Also, patient will need to obtain a restaging CT scan prior to his visit to see Dr. Julien Nordmann on 05/16/2016.  Confirmed that the CT order has been placed; but has not been scheduled as of yet.  Both patient and his wife are aware of these appointments.  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  Patient was encouraged as protein in his diet is much as possible.  Fever, unspecified Patient states that he has been experiencing some intermittent fevers for the past several weeks.  He states that typically taking Tylenol will resolve the fever.  He states that he has experienced fevers again for the past 2 days; with the highest fever 100.7.  He states that Tylenol has not resolved these fevers in the  last 2 days.  Patient states he had a fever this morning of 100.7; but presented to the cancer Center today with no fever whatsoever.  Temperature on initial check was 98.0.  Patient denies any other new symptoms whatsoever.  He denies any URI symptoms, sinus drainage, cough, UTI symptoms.  Blood counts obtained today were essentially within normal limits; with a mild decreased platelets.  Also, urinalysis obtained today was essentially normal.  Urine culture results pending.  Exam today revealed a well-looking  male; in no acute distress whatsoever.  Bilateral breath sounds essentially clear; with a soft wheeze occasionally noted.  Patient states that "he has had pneumonia in the past; and notes that he doesn't have pneumonia now."  Discussed option of obtaining a chest x-ray for further evaluation today; but patient stated that he did not feel that it was needed.  Patient was advised he can take Tylenol for any low-grade fevers.  Advised patient to call/return or go directly to the emergency department for any worsening symptoms whatsoever.  Also, patient has followed up with both Dr. Johnsie Cancel cardiologist and Dr. Lenna Gilford pulmonologist in the past few weeks as well.  Patient states that he does have a return appointment for his pulmonologist next week as well.  Patient was advised to follow-up with his pulmonologist as well.   Patient stated understanding of all instructions; and was in agreement with this plan of care. The patient knows to call the clinic with any problems, questions or concerns.   Total time spent with patient was 25 minutes;  with greater than 75 percent of that time spent in face to face counseling regarding patient's symptoms,  and coordination of care and follow up.  Disclaimer:This dictation was prepared with Dragon/digital dictation along with [employer]. Any transcriptional errors that result from this process are unintentional.  Drue Second, NP 03/28/2016

## 2016-03-28 NOTE — Assessment & Plan Note (Signed)
Patient is status post carboplatin/Taxol chemotherapy last received on 01/24/2016.  Patient is also completed all radiation treatments as well.  He is currently undergoing observation only.  Patient is scheduled for labs, visit, only on 05/10/2016.  He is scheduled for follow-up visit with Dr. Julien Nordmann on 05/16/2016.  Also, patient will need to obtain a restaging CT scan prior to his visit to see Dr. Julien Nordmann on 05/16/2016.  Confirmed that the CT order has been placed; but has not been scheduled as of yet.  Both patient and his wife are aware of these appointments.

## 2016-03-28 NOTE — Assessment & Plan Note (Signed)
Patient states that he has been experiencing some intermittent fevers for the past several weeks.  He states that typically taking Tylenol will resolve the fever.  He states that he has experienced fevers again for the past 2 days; with the highest fever 100.7.  He states that Tylenol has not resolved these fevers in the last 2 days.  Patient states he had a fever this morning of 100.7; but presented to the cancer Center today with no fever whatsoever.  Temperature on initial check was 98.0.  Patient denies any other new symptoms whatsoever.  He denies any URI symptoms, sinus drainage, cough, UTI symptoms.  Blood counts obtained today were essentially within normal limits; with a mild decreased platelets.  Also, urinalysis obtained today was essentially normal.  Urine culture results pending.  Exam today revealed a well-looking male; in no acute distress whatsoever.  Bilateral breath sounds essentially clear; with a soft wheeze occasionally noted.  Patient states that "he has had pneumonia in the past; and notes that he doesn't have pneumonia now."  Discussed option of obtaining a chest x-ray for further evaluation today; but patient stated that he did not feel that it was needed.  Patient was advised he can take Tylenol for any low-grade fevers.  Advised patient to call/return or go directly to the emergency department for any worsening symptoms whatsoever.  Also, patient has followed up with both Dr. Johnsie Cancel cardiologist and Dr. Lenna Gilford pulmonologist in the past few weeks as well.  Patient states that he does have a return appointment for his pulmonologist next week as well.  Patient was advised to follow-up with his pulmonologist as well.

## 2016-03-28 NOTE — Patient Instructions (Signed)
Fever, Adult A fever is an increase in the body's temperature. It is usually defined as a temperature of 100F (38C) or higher. Brief mild or moderate fevers generally have no long-term effects, and they often do not require treatment. Moderate or high fevers may make you feel uncomfortable and can sometimes be a sign of a serious illness or disease. The sweating that may occur with repeated or prolonged fever may also cause dehydration. Fever is confirmed by taking a temperature with a thermometer. A measured temperature can vary with:  Age.  Time of day.  Location of the thermometer:  Mouth (oral).  Rectum (rectal).  Ear (tympanic).  Underarm (axillary).  Forehead (temporal). HOME CARE INSTRUCTIONS Pay attention to any changes in your symptoms. Take these actions to help with your condition:  Take over-the counter and prescription medicines only as told by your health care provider. Follow the dosing instructions carefully.  If you were prescribed an antibiotic medicine, take it as told by your health care provider. Do not stop taking the antibiotic even if you start to feel better.  Rest as needed.  Drink enough fluid to keep your urine clear or pale yellow. This helps to prevent dehydration.  Sponge yourself or bathe with room-temperature water to help reduce your body temperature as needed. Do not use ice water.  Do not overbundle yourself in blankets or heavy clothes. SEEK MEDICAL CARE IF:  You vomit.  You cannot eat or drink without vomiting.  You have diarrhea.  You have pain when you urinate.  Your symptoms do not improve with treatment.  You develop new symptoms.  You develop excessive weakness. SEEK IMMEDIATE MEDICAL CARE IF:  You have shortness of breath or have trouble breathing.  You are dizzy or you faint.  You are disoriented or confused.  You develop signs of dehydration, such as a dry mouth, decreased urination, or paleness.  You develop  severe pain in your abdomen.  You have persistent vomiting or diarrhea.  You develop a skin rash.  Your symptoms suddenly get worse.   This information is not intended to replace advice given to you by your health care provider. Make sure you discuss any questions you have with your health care provider.   Document Released: 02/06/2001 Document Revised: 05/04/2015 Document Reviewed: 10/07/2014 Elsevier Interactive Patient Education 2016 Elsevier Inc.  

## 2016-03-28 NOTE — Telephone Encounter (Signed)
Added smc apt & labs

## 2016-03-28 NOTE — Progress Notes (Signed)
I updated Retta Mac NP's nurse regarding patient.  I completed urgent POF and ordered stat labs.  I called Mr. Rowlette to update him on appt.  He was thankful for the call back.

## 2016-03-29 LAB — URINE CULTURE: Organism ID, Bacteria: NO GROWTH

## 2016-03-29 NOTE — Telephone Encounter (Signed)
Attempted to call the [place]. No answer in the pharmacy. Will try back.

## 2016-03-30 ENCOUNTER — Telehealth: Payer: Self-pay | Admitting: Nurse Practitioner

## 2016-03-30 ENCOUNTER — Ambulatory Visit (HOSPITAL_COMMUNITY)
Admission: RE | Admit: 2016-03-30 | Discharge: 2016-03-30 | Disposition: A | Discharge status: Home / Self Care | Payer: Medicare Other | Source: Ambulatory Visit | Attending: Nurse Practitioner | Admitting: Nurse Practitioner

## 2016-03-30 ENCOUNTER — Telehealth: Payer: Self-pay

## 2016-03-30 ENCOUNTER — Other Ambulatory Visit: Payer: Self-pay | Admitting: Nurse Practitioner

## 2016-03-30 DIAGNOSIS — Z923 Personal history of irradiation: Secondary | ICD-10-CM | POA: Diagnosis not present

## 2016-03-30 DIAGNOSIS — C3411 Malignant neoplasm of upper lobe, right bronchus or lung: Secondary | ICD-10-CM

## 2016-03-30 DIAGNOSIS — R918 Other nonspecific abnormal finding of lung field: Secondary | ICD-10-CM | POA: Insufficient documentation

## 2016-03-30 MED ORDER — LEVOFLOXACIN 500 MG PO TABS: 500.0000 mg | ORAL_TABLET | Freq: Every day | ORAL | 0 refills | Status: DC

## 2016-03-30 MED ORDER — BUDESONIDE-FORMOTEROL FUMARATE 160-4.5 MCG/ACT IN AERO: 2.0000 | INHALATION_SPRAY | Freq: Two times a day (BID) | RESPIRATORY_TRACT | 11 refills | Status: DC

## 2016-03-30 NOTE — Telephone Encounter (Signed)
Symptom management clinic nurse received call from patient earlier today stating that he continues to have intermittent fevers and occasional cough and wheeze.  Reviewed all details of today's phone call with Dr. Julien Nordmann; and he agreed that patient should come in for a repeat chest x-ray.  Chest x-ray obtained today revealed:  IMPRESSION: 1. Acute reticulonodular opacity at the right lung base suspicious for acute pneumonia in this setting. 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 No associated pleural effusion. 2. 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  This provider reviewed findings of the chest x-ray with Dr. Benay Spice on call physician; and he recommended that patient be called and antibiotics for treatment of any possible pneumonia.  Then called the patient and reviewed x-ray with him as well.  Will prescribe Levaquin antibiotics for treatment of any possible pneumonia.  Patient was advised to go directed to the emergency department over the weekend if he develops any worsening symptoms whatsoever.  Also, patient is scheduled to meet with both his cardiologist and his pulmonologist next week for follow-up visits as well.  He was encouraged to discuss his symptoms with both the cardiologist and pulmonologist as well.  Patient stated understanding of all instructions and was in agreement with this plan of care.  He was appreciative of the call this evening.

## 2016-03-30 NOTE — Telephone Encounter (Signed)
rx has been printed out and will have RA sign and will need to give back to triage to fax this.

## 2016-03-30 NOTE — Telephone Encounter (Signed)
rx has been signed and faxed. Nothing further is needed.

## 2016-03-30 NOTE — Telephone Encounter (Signed)
Okay to change to Symbicort 160-2 puffs twice daily

## 2016-03-30 NOTE — Telephone Encounter (Signed)
Called to check on pt post [facility] visit, pt states that he is still having fevers of 100.7 with no resolution from tylenol.  Spoke to CB and MM, MM okay'd a CXR, ordered by CB, relayed info to pt, who stated understanding at the importance of coming in.

## 2016-03-30 NOTE — Telephone Encounter (Signed)
Attempted to contact the [place] pharmacy again. No answer. They will not cover Breo but will cover Symbicort.  RA - please advise. Thanks.

## 2016-04-02 ENCOUNTER — Telehealth: Payer: Self-pay | Admitting: Medical Oncology

## 2016-04-02 NOTE — Telephone Encounter (Signed)
Message left to call radiology with pts phone number attached to message. I called pt and he nor his wife did not call. He said he got started on an antibiotic on Friday after someone called him . I instructed him to call for any other problems including temp >100.5. He voiced understanding.

## 2016-04-03 ENCOUNTER — Encounter: Payer: Self-pay | Admitting: Cardiovascular Disease

## 2016-04-03 ENCOUNTER — Ambulatory Visit (INDEPENDENT_AMBULATORY_CARE_PROVIDER_SITE_OTHER): Payer: Medicare Other | Admitting: Cardiovascular Disease

## 2016-04-03 VITALS — BP 126/70 | HR 84 | Ht 70.0 in | Wt 177.4 lb

## 2016-04-03 DIAGNOSIS — C3411 Malignant neoplasm of upper lobe, right bronchus or lung: Secondary | ICD-10-CM

## 2016-04-03 NOTE — Progress Notes (Signed)
Cardiology Office Note    Date:  04/03/2016   ID:  Paul Sandoval, DOB 1941-08-16, MRN [id number]  PCP:  [name], MD  Cardiologist:  Dr. Johnsie Cancel  CC: post hospital follow up  History of Present Illness:   22M with CAD (s/p CABG 1996, [place] 02/2014 s/p DES to dLM & staged PCI of SVG-diag), carotid disease and aortic stent graft followed by Dr. 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  In 02/2014 he underwent planned diagnostic left heart catheterization, revealing severe three-vessel coronary artery disease and total occlusion of the RCA, total occlusion of the LAD, severe ulcerative stenosis of the distal left main, and severe diffuse stenosis of the OM left circumflex branches. There was continued patency of the saphenous vein graft to his PDA LIMA to LAD and patent but severely diseased saphenous vein graft to the diagonal. The patient underwent PCI of the distal left main with drug-eluting stent. A staged PCI of the saphenous vein graft to the diagonal 02/2014. Last seen by Dr. Johnsie Cancel 03/21/15. That time he was walking 2 miles every day and playing golf. No anginal pain.  He was diagnosed with a lung cancer in 10/2015 and completed radiation + carboplatin/paclitaxel therapy x 4 in early June. He was admitted 02/05/16-02/08/16 for community-acquired pneumonia. He also had acute on chronic kidney disease and hyponatremia with sodium 125. His hydrochlorothiazide was stopped and started on sodium tablets. His Enalapril 10 mg was discontinued at that time.  Since then he continued to have cough and intermittent low-grade fever. He then noted progressive worsening of lower extremity edema and orthopnea. He was seen in [facility] 02/21/16 and complaining of increased shortness of breath, fatigue  and cough with temperature of 100.1. 7 admitted directly to [facility] for further evaluation where workup revealed worsening pneumonia on RUL. CT showed pneumonia and R pleural effusion s/p t thoracocentesis with 1150 ml of clear yellow pleural fluid was obtained. His CT scan findings were reviewed by the radiation oncologist Dr. Tammi Klippel who recommended 30 day prednisone taper as his CT scan findings could be related to radiation-induced lung injury.They then felt like he probably did not have PNA and held Abx and started on prednisone as recommended.   Due to concern for acute heart failure BNP was obtained which showed elevation >1000. Echo done during this admission showed reduction in his left ventricular function to 63-01%, grade 2 diastolic dysfunction, elevated ventricular end-diastolic filling pressure (EF of 45-50% 03/17/14). He was diuresed and outpatient myoview was recommended. His troponin was noted to be elevated but c/w demand ischemia in the setting of acute CHF. His home HCTZ was discontinued and he was started on Lasix '40mg'$  daily.   He saw Dr. Lenna Gilford with PCCM  and repeat CXR on 02/29/16 showed "stable right upper lobe postradiation changes and/or infiltrate. Tiny bilateral pleural effusions cannot be excluded." Labs showed improving H/H to 10.9/33.5. BNP was still elevated with elevated CO2 and he was started on Diamox. He was continued on lasix '40mg'$  daily.   Today he presents to clinic for follow up. No dizziness or syncope. He has been feeling much better. No LE edema, orthopnea or PND. No chest pain. No dizziness or syncope. No blood in his stool or urine. Just so much happier to be  out of the hospital. He thinks this was all caused by the sodium tablets he was given during his first admission.   F/U myovue done 03/06/16 reviewed by Dr Henrene Dodge medical [facility]   Nuclear stress EF: 30%. The LV function is markedly depressed  There was no ST segment deviation noted during  stress.  Defect 1: There is a large defect of severe severity present in the basal anteroseptal, mid anterior, mid anteroseptal, apical anterior and apex location.  Findings consistent with prior anterior apical myocardial infarction. There is no evidence of reversible ischemia  This is a high risk study.   No chest pain ? Pneumonia on antibiotics last 6 days.   Past Medical History:  Diagnosis Date  . AAA (abdominal aortic aneurysm) ([place]) 2010   4.4 cm 08/2008;4.44 in 7/10 and 4.65 in 08/2009; 4.8 by CT in 11/2009; 4.3 by ultrasound in 08/2010  . Arteriosclerotic cardiovascular disease (ASCVD) 1996   CABG-1996  . Arthritis    "fingers" (03/18/2014)  . CAD (coronary artery disease)    03/18/14:  PCI with DES to distal left main. 7/29: DES to the SVG to Diag  . Cancer ([place])    Upper right lobe lung cancer  . Cardiomyopathy, ischemic    Echo 03/17/14: EF 45-50%  . Chronic bronchitis ([place])   . Chronic rhinitis   . Colonic polyp 2002   polypectomy in 2002  . COPD (chronic obstructive pulmonary disease) ([place])   . Diverticulosis   . ED (erectile dysfunction)   . Encounter for antineoplastic chemotherapy 12/19/2015  . GERD (gastroesophageal reflux disease)   . Hyperlipidemia   . Hypertension   . IFG (impaired fasting glucose)   . Myocardial infarction [facility])    "told h/o silent MI sometime before 1996"  . Pneumonia ~ 2001; ~ 2005  . Right bundle branch block   . Tobacco abuse, in remission    40 pack year total consumption; discontinued in 1996    Past Surgical History:  Procedure Laterality Date  . ABDOMINAL AORTIC ANEURYSM REPAIR  11/2012  . ABDOMINAL AORTIC ENDOVASCULAR STENT GRAFT N/A 12/11/2012   Procedure: ABDOMINAL AORTIC ENDOVASCULAR STENT GRAFT;  Surgeon: Mal Misty, MD;  Location: [place];  Service: Vascular;  Laterality: N/A;  Ultrasound guided; [place]  . CARDIAC CATHETERIZATION  01/08/1995  . COLONOSCOPY  2002   polypectomy-patient denies  . CORONARY ANGIOPLASTY WITH  STENT PLACEMENT  03/18/2014   "1"  . CORONARY ANGIOPLASTY WITH STENT PLACEMENT  03/24/2014   "1"  . CORONARY ARTERY BYPASS GRAFT  01/09/1995   "CABG X3"  . JOINT REPLACEMENT    . LAPAROSCOPIC CHOLECYSTECTOMY  12/2009  . LEFT AND RIGHT HEART CATHETERIZATION WITH CORONARY/GRAFT ANGIOGRAM N/A 03/18/2014   Procedure: LEFT AND RIGHT HEART CATHETERIZATION WITH Beatrix Fetters;  Surgeon: Blane Ohara, MD;  Location: [facility] CATH LAB;  Service: Cardiovascular;  Laterality: N/A;  . PERCUTANEOUS CORONARY STENT INTERVENTION (PCI-S)  03/18/2014   Procedure: PERCUTANEOUS CORONARY STENT INTERVENTION (PCI-S);  Surgeon: Blane Ohara, MD;  Location: [facility] CATH LAB;  Service: Cardiovascular;;  . PERCUTANEOUS CORONARY STENT INTERVENTION (PCI-S) N/A 03/24/2014   Procedure: PERCUTANEOUS CORONARY STENT INTERVENTION (PCI-S);  Surgeon: Blane Ohara, MD;  Location: [facility] CATH LAB;  Service: Cardiovascular;  Laterality: N/A;  . TOTAL HIP ARTHROPLASTY Left 01/21/2013   Procedure: TOTAL HIP ARTHROPLASTY ANTERIOR APPROACH;  Surgeon: Mauri Pole, MD;  Location: [place];  Service: Orthopedics;  Laterality: Left;  Marland Kitchen VIDEO BRONCHOSCOPY N/A 11/17/2015   Procedure: VIDEO BRONCHOSCOPY  WITH FLUORO;  Surgeon: Rigoberto Noel, MD;  Location: [place];  Service: Cardiopulmonary;  Laterality: N/A;    Current Medications: Outpatient Medications Prior to Visit  Medication Sig Dispense Refill  . acetaminophen (TYLENOL) 500 MG tablet Take 500 mg by mouth every 6 (six) hours as needed for mild pain.    Marland Kitchen acetaZOLAMIDE (DIAMOX SEQUELS) 500 MG capsule Take 1 capsule (500 mg total) by mouth daily. At 4pm 30 capsule 3  . aspirin EC 81 MG tablet Take 81 mg by mouth daily.    . clopidogrel (PLAVIX) 75 MG tablet Take 1 tablet (75 mg total) by mouth daily. Reported on 12/02/2015 90 tablet 3  . Ferrous Sulfate (IRON) 28 MG TABS Take 28 mg by mouth daily.    . fluticasone furoate-vilanterol (BREO ELLIPTA) 100-25 MCG/INH AEPB Inhale 1 puff  into the lungs daily. 180 each 3  . furosemide (LASIX) 40 MG tablet Take 1 tablet (40 mg total) by mouth daily. 30 tablet 11  . levofloxacin (LEVAQUIN) 500 MG tablet Take 1 tablet (500 mg total) by mouth daily. 10 tablet 0  . metoprolol 75 MG TABS Take 75 mg by mouth 2 (two) times daily. Dispense as needed 1 month supply only 30 tablet 0  . Multiple Vitamins-Minerals (CENTRUM SILVER ADULT 50+) TABS Take 1 tablet by mouth daily.     . nitroGLYCERIN (NITROSTAT) 0.4 MG SL tablet Place 1 tablet (0.4 mg total) under the tongue every 5 (five) minutes as needed for chest pain. 25 tablet 4  . simvastatin (ZOCOR) 80 MG tablet Take 0.5 tablets (40 mg total) by mouth at bedtime. 15 tablet 5  . VENTOLIN HFA 108 (90 Base) MCG/ACT inhaler INHALE 2 PUFFS BY MOUTH EVERY 4 TO 6 HOURS AS NEEDED FOR WHEEZING. 18 g 5  . omeprazole (PRILOSEC) 20 MG capsule TAKE ONE CAPSULE BY MOUTH DAILY. (Patient taking differently: TAKE 20 MG BY MOUTH DAILY.) 90 capsule 1  . budesonide-formoterol (SYMBICORT) 160-4.5 MCG/ACT inhaler Inhale 2 puffs into the lungs 2 (two) times daily. 1 Inhaler 11  . fluticasone furoate-vilanterol (BREO ELLIPTA) 100-25 MCG/INH AEPB Inhale 1 puff into the lungs daily. 2 each 0  . predniSONE (DELTASONE) 5 MG tablet Label  & dispense according to the schedule below. 0 refills. Take '60mg'$  PO daily x 5 days, then '50mg'$  x 5 days, then '40mg'$  x 5 days, then '30mg'$  x 5 days, then '20mg'$  x 5 days, then '10mg'$  x 5 days, then '5mg'$  x 5 days, then 2.'5mg'$  x 5 days, then 1 mg x 5 days, then 0.'5mg'$  x 5 days and stop. 100 tablet 0   No facility-administered medications prior to visit.      Allergies:   Neomycin   Social History   Social History  . Marital status: Married    Spouse name: N/A  . Number of children: 1  . Years of education: N/A   Occupational History  . Retired     [employer]   Social History Main Topics  . Smoking status: Former Smoker    Packs/day: 1.50    Years: 30.00    Types: Cigarettes     Start date: 12/01/1956    Quit date: 01/08/1995  . Smokeless tobacco: Never Used  . Alcohol use 0.0 oz/week     Comment: 03/18/2014 "no alacohol since 1996"  . Drug use: No  . Sexual activity: No   Other Topics Concern  . None   Social History Narrative  . None  Family History:  The patient's family history includes Arthritis in his mother and sister; Cancer in his father; Heart disease in his father; Hypertension in his brother; Parkinsonism in his mother.     ROS:   Please see the history of present illness.    ROS All other systems reviewed and are negative.   PHYSICAL EXAM:   VS:  BP 126/70   Pulse 84   Ht '5\' 10"'$  (1.778 m)   Wt 177 lb 6.4 oz (80.5 kg)   BMI 25.45 kg/m    GEN: Well nourished, well developed, in no acute distress  HEENT: normal  Neck: no JVD, carotid bruits, or masses Cardiac: RRR; no murmurs, rubs, or gallops,no edema  Respiratory:  clear to auscultation bilaterally, normal work of breathing GI: soft, nontender, nondistended, + BS MS: no deformity or atrophy  Skin: warm and dry, no rash Neuro:  Alert and Oriented x 3, Strength and sensation are intact Psych: euthymic mood, full affect  Wt Readings from Last 3 Encounters:  04/03/16 177 lb 6.4 oz (80.5 kg)  03/28/16 176 lb 9.6 oz (80.1 kg)  03/13/16 176 lb 8 oz (80.1 kg)      Studies/Labs Reviewed:   EKG:  EKG is NOT ordered today.     Recent Labs: 02/21/2016: B Natriuretic Peptide 942.6 02/23/2016: Magnesium 1.9 02/24/2016: TSH 0.918 02/29/2016: Pro B Natriuretic peptide (BNP) 1,053.0 03/28/2016: ALT 31; BUN 22.2; Creatinine 1.6; HGB 11.4; Platelets 85; Potassium 4.0; Sodium 134   Lipid Panel    Component Value Date/Time   CHOL 148 08/23/2015 0853   TRIG 139 08/23/2015 0853   HDL 37 (L) 08/23/2015 0853   CHOLHDL 4.0 08/23/2015 0853   CHOLHDL 4.4 06/30/2014 0810   VLDL 45 (H) 06/30/2014 0810   LDLCALC 83 08/23/2015 0853    Additional studies/ records that were reviewed today include:   2D ECHO: 02/22/2016 LV EF: 30% - 35% Study Conclusions - Left ventricle: There is diffuse hypokinesis and akinesis of the mid anteroseptal, aterior and anterolateral walls and apical anterior and septal walls. The cavity size was normal. There was mild concentric hypertrophy. Systolic function was moderately to severely reduced. The estimated ejection fraction was in the range of 30% to 35%. Features are consistent with a pseudonormal left ventricular filling pattern, with concomitant abnormal relaxation and increased filling pressure (grade 2 diastolic dysfunction). Doppler parameters are consistent with elevated ventricular end-diastolic filling pressure. - Aortic valve: Trileaflet; normal thickness leaflets. There was no regurgitation. - Aortic root: The aortic root was normal in size. - Mitral valve: Structurally normal valve. There was mild  regurgitation. - Left atrium: The atrium was mildly dilated. - Right ventricle: The cavity size was normal. Wall thickness was normal. Systolic function was normal. - Right atrium: The atrium was normal in size. - Tricuspid valve: There was mild regurgitation. - Pulmonic valve: There was trivial regurgitation. - Pulmonary arteries: Systolic pressure was within the normal range. - Inferior vena cava: The vessel was normal in size. - Pericardium, extracardiac: There was no pericardial effusion.    ASSESSMENT & PLAN:   Chronic combined CHF with cardiomyopathy - 2D ECHO on most recent admission with decrease in left ventricular function to 36-64%, grade 2 diastolic dysfunction, elevated ventricular end-diastolic filling pressure (EF of 45-50% 03/17/14).  This may be related to chemotherapy, however, there are regional wall motion abnormalities. Myovue  7/11 scar no ischemia. Given unclear future of prognosis  With lung cancer will not recommend cath or AICD evaluation at  this time   CAD - continue ASA, Plavix, BB, statin. Lexiscan myoview non  ischemic     HTN - controlled.  CKD stage III - baseline Cr appears 1.3-1.6. 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 No transfusion on most recent admission.   Lab Results  Component Value Date   HCT 35.6 (L) 03/28/2016    Right Lung NSCLC Stage IIIA (T2b, N2, M0) - undergoing chemotherapy. Followed by Dr. Julien Nordmann in the [place]. His CT scan findings were reviewed by the radiation oncologist Dr. Tammi Klippel who recommended 30 day prednisone taper as this could be related to radiation-induced lung injury.Finishing antibiotics for pneumonia F/u with pulmonary , oncology and XRT  Medication Adjustments/Labs and Tests Ordered: Current medicines are reviewed at length with the patient today.  Concerns regarding medicines are outlined above.  Medication changes, Labs and Tests ordered today are listed in the Patient Instructions below. Patient Instructions  Medication Instructions:  Your physician recommends that you continue on your current medications as directed. Please refer to the Current Medication list given to you today.  Labwork: NONE  Testing/Procedures: NONE  Follow-Up: Your physician wants you to follow-up in: 3 months with Dr. Johnsie Cancel in [place] or [place].   If you need a refill on your cardiac medications before your next appointment, please call your pharmacy.       Signed, Jenkins Rouge, MD  04/03/2016 5:07 PM    [facility] [address], [place], [place]  [address] Phone: [phone]; Fax: [phone]

## 2016-04-03 NOTE — Patient Instructions (Addendum)
Medication Instructions:  Your physician recommends that you continue on your current medications as directed. Please refer to the Current Medication list given to you today.  Labwork: NONE  Testing/Procedures: NONE  Follow-Up: Your physician wants you to follow-up in: 3 months with Dr. Johnsie Cancel in [place] or [facility].   If you need a refill on your cardiac medications before your next appointment, please call your pharmacy.

## 2016-04-04 ENCOUNTER — Encounter: Payer: Self-pay | Admitting: Pulmonary Disease

## 2016-04-04 ENCOUNTER — Ambulatory Visit (INDEPENDENT_AMBULATORY_CARE_PROVIDER_SITE_OTHER): Payer: Medicare Other | Admitting: Pulmonary Disease

## 2016-04-04 VITALS — BP 116/56 | HR 79 | Temp 97.5°F | Ht 70.0 in | Wt 178.1 lb

## 2016-04-04 DIAGNOSIS — J449 Chronic obstructive pulmonary disease, unspecified: Secondary | ICD-10-CM

## 2016-04-04 DIAGNOSIS — I2510 Atherosclerotic heart disease of native coronary artery without angina pectoris: Secondary | ICD-10-CM

## 2016-04-04 DIAGNOSIS — I714 Abdominal aortic aneurysm, without rupture, unspecified: Secondary | ICD-10-CM

## 2016-04-04 DIAGNOSIS — I679 Cerebrovascular disease, unspecified: Secondary | ICD-10-CM

## 2016-04-04 DIAGNOSIS — C3411 Malignant neoplasm of upper lobe, right bronchus or lung: Secondary | ICD-10-CM | POA: Diagnosis not present

## 2016-04-04 DIAGNOSIS — I255 Ischemic cardiomyopathy: Secondary | ICD-10-CM

## 2016-04-04 DIAGNOSIS — J45909 Unspecified asthma, uncomplicated: Secondary | ICD-10-CM

## 2016-04-04 DIAGNOSIS — I2583 Coronary atherosclerosis due to lipid rich plaque: Secondary | ICD-10-CM

## 2016-04-04 DIAGNOSIS — I5043 Acute on chronic combined systolic (congestive) and diastolic (congestive) heart failure: Secondary | ICD-10-CM

## 2016-04-04 DIAGNOSIS — N183 Chronic kidney disease, stage 3 unspecified: Secondary | ICD-10-CM

## 2016-04-04 DIAGNOSIS — J4489 Other specified chronic obstructive pulmonary disease: Secondary | ICD-10-CM

## 2016-04-04 NOTE — Progress Notes (Signed)
Subjective:     Patient ID: Paul Sandoval, male   DOB: 1941/06/06, 75 y.o.   MRN: [id number]  HPI  ~  February 29, 2016:  [place] w/ SN>  Paul Sandoval is a 75 y/o WF w/ mult medical problems as noted below who has seen DrAlva & DrMannam previously;  I have reviewed his extensive epic records and recent [place] x2 in June2017 and formulated the following PROBLEM LIST>>  His PCP is DrLuking in [place]... He reminded me that I cared for his dad in 63...    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    ~  HSimpsonx2 in 01/2016>  Fever, cough, increased RUL opac (PCT<0.10, he received 10d Levaquin) & right>left effusion w/ thoracentesis 02/22/16 removing 1200cc clear yellow fluid, prob transudate, & BNP was >900; meds adjusted & he was diuresed w/ improvement but 2DEcho revealed worsening CHF w/ EF ~30-35% + HK & AK => he has f/u pending w/ Cards...    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    ASPVD- s/p AAA stent graft 11/2012, mod bilat carotid occlusive dis (asymptomatic) w/ CDopplers showing ~50-60% bilat ICAstenoses>  [facility] plans yearly f/u CT Abd and CDopplers    MEDICAL issues>  HBP, HL, GERD/ Divertic/ Polyps, CKD-stage3, Anemia & thrombocytopenia>  On Simva80-1/2Qd, Prilosec20/d, Fe/ MVI/ etc... Since he was disch 02/25/16>  He reports feeling better, breathing better, still sl SOB w/ activ but not requiring O2, mild cough w/ beige sput, no hemoptysis, denies CP=> he is due for f/u CXR & blood work post hosp... EXAM shows Afeb, VSS, O2sat=95% on RA after walking back; Wt=180#;  HEENT- neg, mallampati2;  Chest- sl decr BS right base, few rhonchi, w/o w/r/consolidation;  Heart- RR Gr1/6SEM w/o r/g;  Abd- soft, neg;  Ext- neg w/o c/c/e...  PFT 02/01/16>  FVC=1.96 (45%), FEV1=1.25 (40%), %1sec=64, mid-flows were reduced at 28% predicted; post bronchodil there was a 31% improvement in FEV1 to 1.64;  TLC=5.96 (83%, RV=3.89 (  149%), RV/TLC=65%;  DLCO=58% pred.  This is c/w moderate to severe airflow obstruction, GOLD Stage 3 COPD w/ a signif asthmatic component, air trapping, and decr diffusion capacity...  2DEcho 02/22/16>  Mild conc LVH w/ decr LVF & EF=30-35% w/ diffuse HK & AK in several walls (see report), Gr2 DD, mild MR otherw norm valves, mild LAdil (4m), norm RV function & PA pressures  LABS 01/2016 Hosp>  Chems- ok x Cr=1.3-1.4, BS=120-140, Alb=2.8;  CBC- anemic w/ Hg=8-9 range, Plat~130K range;  TSH=0.92  CT Chest 02/21/16>  Norm heart size, extensive coronary calcif, s/p CABG, decr size of the pathological  right paratracheal LN, decr size of RUL mass (now ~2cm & prev ~4cm), new confluent airsp opac w/ air bronchograms in RUL w/ large right effusion; s/p GB, +HH, Abd Ao stent graft, DJD & DISH w/o metastatic lesions  CT Angio Chest 02/24/16>  NEG for PE, s/pCABG & stent in Lmain, norm heart size, no pericard fluid, small right pleural effusion, architectural distortion & interstitial opacities in RUL c/w XRT changes, RUL nodule measures ~2cm, no adenopathy reported...   CXR 02/29/16>  Stable heart size, tortuous Ao, s/p CABG, sl improvement in interstitial opac in RUL area & posteromedial RUL nodule  LABS 02/29/16>  Chems- ok x HCO3=36, BS=120, BUN/Cr=30/1.40;  CBC- Hg=10.9, Plat=249K, WBC=18.2 (on Pred);  BNP=1053;  Sed=53 IMP/PLAN>>  Problem list as above w/ signif cardiac, pulmonary, & post chemoradiation changes;  From the pulm standpoint- he feels the BREO is helping & hasn't needed the rescue inhaler very often, continue same;  He is on a long PRED taper per DrManning for poss radiation fibrosis RUL, Sed rate is 53, continue this taper & careful w/ sweets/ sugar etc;  From the cardiac standpoint- he has signif underlying dis w/ Lmain stent, s/p CABG 20 yrs ago, prob ischemic cardiomyopathy w/ worsening 2DEcho recently w/ 30-35% EF + HK&AK seen, BNP=1053, Cards plans ischemic work up when able, in the meanwhile he has improved w/ diuresis but Chems w/ mild RI & HCO3=36... REC- add DIAMOX-ER 500 one tab daily at 4pm, continue his Lasix40 Qam, increase water intake, watch weights... We plan ROV 170monthecheck...   ~  April 04, 2016:  [id number] & Paul Sandoval called the Oncology symptom management clinic 8/4 w/ intermittent cough, wheezing, fever; they did a f/u CXR interpreted as showing a right base opac suspicious for acute pneumonia but this is a soft finding on my review of the XRay, and the prev RUL reticulonod opac is improved (radiation fibrosis improved w/ Pred taper); placed on Levaquin Rx;  He denies sput  production, hemoptysis, CP, or SOB but he is fairly sedentary.    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    ~  HShandonx2 in 01/2016>  Fever, cough, increased RUL opac (PCT<0.10, he received 10d Levaquin) & right>left effusion w/ thoracentesis 02/22/16 removing 1200cc clear yellow fluid, prob transudate, & BNP was >900; meds adjusted & he was diuresed w/ improvement but 2DEcho revealed worsening CHF w/ EF ~30-35% + HK & AK => he has f/u pending w/ Cards...    ~  [facility]  He had right pleural effusion tapped 02/22/16=> Prob TRANSUDATE w/ TProt<3.0, LDH=95, Cytology=NEG (reactive mesothelial cells only);  Cults=> no growth    ~  03/2016 developed cough/ wheezing/ low grade fever=> CXR w/ improved RUL, ?incr markings R base, he was off the Pred, given more Levaquin...     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    ~  He saw CARDS PA 03/01/16> no change in meds, Lexiscan Myoview 03/06/16 showed hi risk study w/ EF=30%, no ST segm changes, c/w antero-apical MI & no evid reversible ischemia...    ASPVD- s/p AAA stent graft 11/2012, mod bilat carotid occlusive dis (asymptomatic) w/ CDopplers showing ~50-60% bilat ICAstenoses>  [facility] plans yearly f/u CT Abd and CDopplers    MEDICAL issues>  HBP, HL, GERD/ Divertic/ Polyps, CKD-stage3, Anemia & thrombocytopenia>  On Simva80-1/2Qd, Prilosec20/d, Fe/ MVI/ etc... EXAM shows Afeb, VSS, O2sat=99% on RA; Wt=178#;  HEENT- neg, mallampati2;  Chest- sl decr BS right base, few rhonchi, w/o w/r/consolidation;  Heart- RR Gr1/6SEM w/o r/g;  Abd- soft, neg;  Ext- neg w/o c/c/e...  Lexiscan Myoview 03/06/16 showed hi risk study w/ EF=30%, no ST segm changes, c/w antero-apical MI & no evid reversible ischemia...  CXR 03/30/16>  incr patchy opac at the right base, no effusion, RUL reticulonodular opac in RUL has regressed; norm heart size s/p CABG, abd Ao endograft part vis  LABS 03/28/16>  Chems- ok w/ K=4.0, HCO3=23, Cr=1.6, BS=145;  CBC- ok w/ Hg=11.4, wbc=6.1, plat=85K;   IMP/PLAN>>  Discussed w/ Paul Sandoval-- continue the Levaquin til gone, use Tylenol for fever or pain; continue the Breo daily & Ventolin-HFA as needed; wean off the Diamox at this time, continue Lasix40, no salt, etc; he needs to gradually increase his exercise/ mobility; we will plan ROV w/ labs in 279mo.    Past Medical History:  Diagnosis Date  . AAA (abdominal aortic aneurysm) ([place]   4.4 cm 08/2008;4.44 in 7/10 and 4.65 in 08/2009; 4.8 by CT in 11/2009; 4.3 by ultrasound in 08/2010  . Arteriosclerotic cardiovascular disease (ASCVD) 1996   CABG-1996  . Arthritis    "fingers" (03/18/2014)  . CAD (coronary artery disease)    03/18/14:  PCI with  DES to distal left main. 7/29: DES to the SVG to Diag  . Cancer ([place]   Upper right lobe lung cancer  . Cardiomyopathy, ischemic    Echo 03/17/14: EF 45-50%  . Chronic bronchitis ([place]  . Chronic rhinitis   . Colonic  polyp 2002   polypectomy in 2002  . COPD (chronic obstructive pulmonary disease) ([place])   . Diverticulosis   . ED (erectile dysfunction)   . Encounter for antineoplastic chemotherapy 12/19/2015  . GERD (gastroesophageal reflux disease)   . Hyperlipidemia   . Hypertension   . IFG (impaired fasting glucose)   . Myocardial infarction [facility]    "told h/o silent MI sometime before 1996"  . Pneumonia ~ 2001; ~ 2005  . Right bundle branch block   . Tobacco abuse, in remission    40 pack year total consumption; discontinued in 1996    Past Surgical History:  Procedure Laterality Date  . ABDOMINAL AORTIC ANEURYSM REPAIR  11/2012  . ABDOMINAL AORTIC ENDOVASCULAR STENT GRAFT N/A 12/11/2012   Procedure: ABDOMINAL AORTIC ENDOVASCULAR STENT GRAFT;  Surgeon: Mal Misty, MD;  Location: [place];  Service: Vascular;  Laterality: N/A;  Ultrasound guided; [place]  . CARDIAC CATHETERIZATION  01/08/1995  . COLONOSCOPY  2002   polypectomy-patient denies  . CORONARY ANGIOPLASTY WITH STENT PLACEMENT  03/18/2014   "1"  . CORONARY ANGIOPLASTY WITH STENT PLACEMENT  03/24/2014   "1"  . CORONARY ARTERY BYPASS GRAFT  01/09/1995   "CABG X3"  . JOINT REPLACEMENT    . LAPAROSCOPIC CHOLECYSTECTOMY  12/2009  . LEFT AND RIGHT HEART CATHETERIZATION WITH CORONARY/GRAFT ANGIOGRAM N/A 03/18/2014   Procedure: LEFT AND RIGHT HEART CATHETERIZATION WITH Beatrix Fetters;  Surgeon: Blane Ohara, MD;  Location[facility] C[facility] LAB;  Service: Cardiovascular;  Laterality: N/A;  . PERCUTANEOUS CORONARY STENT INTERVENTION (PCI-S)  03/18/2014   Procedure: PERCUTANEOUS CORONARY STENT INTERVENTION (PCI-S);  Surgeon: Blane Ohara, MD;  Location: [facility] CATH LAB;  Service: Cardiovascular;;  . PERCUTANEOUS CORONARY STENT  INTERVENTION (PCI-S) N/A 03/24/2014   Procedure: PERCUTANEOUS CORONARY STENT INTERVENTION (PCI-S);  Surgeon: Blane Ohara, MD;  Location: [facility] CATH LAB;  Service: Cardiovascular;  Laterality: N/A;  . TOTAL HIP ARTHROPLASTY Left 01/21/2013   Procedure: TOTAL HIP ARTHROPLASTY ANTERIOR APPROACH;  Surgeon: Mauri Pole, MD;  Location: [place];  Service: Orthopedics;  Laterality: Left;  Marland Kitchen VIDEO BRONCHOSCOPY N/A 11/17/2015   Procedure: VIDEO BRONCHOSCOPY WITH FLUORO;  Surgeon: Rigoberto Noel, MD;  Location: [place];  Service: Cardiopulmonary;  Laterality: N/A;    Outpatient Encounter Prescriptions as of 04/04/2016  Medication Sig  . acetaminophen (TYLENOL) 500 MG tablet Take 500 mg by mouth every 6 (six) hours as needed for mild pain.  Marland Kitchen acetaZOLAMIDE (DIAMOX SEQUELS) 500 MG capsule Take 1 capsule (500 mg total) by mouth daily. At 4pm  . aspirin EC 81 MG tablet Take 81 mg by mouth daily.  . clopidogrel (PLAVIX) 75 MG tablet Take 1 tablet (75 mg total) by mouth daily. Reported on 12/02/2015  . Ferrous Sulfate (IRON) 28 MG TABS Take 28 mg by mouth daily.  . fluticasone furoate-vilanterol (BREO ELLIPTA) 100-25 MCG/INH AEPB Inhale 1 puff into the lungs daily.  . furosemide (LASIX) 40 MG tablet Take 1 tablet (40 mg total) by mouth daily.  Marland Kitchen levofloxacin (LEVAQUIN) 500 MG tablet Take 1 tablet (500 mg total) by mouth daily.  . metoprolol 75 MG TABS Take 75 mg by mouth 2 (two) times daily. Dispense as needed 1 month supply only  . Multiple Vitamins-Minerals (CENTRUM SILVER ADULT 50+) TABS Take 1 tablet by mouth daily.   . nitroGLYCERIN (NITROSTAT) 0.4 MG SL tablet Place 1 tablet (0.4 mg total) under the tongue every 5 (five) minutes as needed for chest pain.  Marland Kitchen omeprazole (PRILOSEC) 20  MG capsule Take 20 mg by mouth daily.  . simvastatin (ZOCOR) 80 MG tablet Take 0.5 tablets (40 mg total) by mouth at bedtime.  . VENTOLIN HFA 108 (90 Base) MCG/ACT inhaler INHALE 2 PUFFS BY MOUTH EVERY 4 TO 6 HOURS AS NEEDED  FOR WHEEZING.   No facility-administered encounter medications on file as of 04/04/2016.     Allergies  Allergen Reactions  . Neomycin Hives    Immunization History  Administered Date(s) Administered  . H1N1 08/06/2008  . Influenza Split 06/24/2013  . Influenza,inj,Quad PF,36+ Mos 06/15/2014, 05/31/2015  . Influenza-Unspecified 05/27/2012  . Pneumococcal Conjugate-13 08/18/2014  . Pneumococcal Polysaccharide-23 03/27/2012  . Zoster 07/27/2008    Current Medications, Allergies, Past Medical History, Past Surgical History, Family History, and Social History were reviewed in [employer] record.   Review of Systems              All symptoms NEG except where BOLDED >>  Constitutional:  F/C/S, fatigue, anorexia, unexpected weight change. HEENT:  HA, visual changes, hearing loss, earache, nasal symptoms, sore throat, mouth sores, hoarseness. Resp:  cough, sputum, hemoptysis; SOB, tightness, wheezing. Cardio:  CP, palpit, DOE, orthopnea, edema. GI:  N/V/D/C, blood in stool; reflux, abd pain, distention, gas. GU:  dysuria, freq, urgency, hematuria, flank pain, voiding difficulty. MS:  joint pain, swelling, tenderness, decr ROM; neck pain, back pain, etc. Neuro:  HA, tremors, seizures, dizziness, syncope, weakness, numbness, gait abn. Skin:  suspicious lesions or skin rash. Heme:  adenopathy, bruising, bleeding. Psyche:  confusion, agitation, sleep disturbance, hallucinations, anxiety, depression suicidal.   Objective:   Physical Exam        Vital Signs:  Reviewed...   General:  WD, WN, 75 y/o WM in NAD; alert & oriented; pleasant & cooperative... HEENT:  [place]/AT; Conjunctiva- pink, Sclera- nonicteric, EOM-wnl, PERRLA, EACs-clear, TMs-wnl; NOSE-clear; THROAT-clear & wnl.  Neck:  Supple w/ fair ROM; no JVD; normal carotid impulses w/ faint bruits; no thyromegaly or nodules palpated; no lymphadenopathy.  Chest:  decr BS right base w/ few rhonchi, no w/r/signs  of consolidation Heart:  Regular Rhythm; norm S1 & S2 w/ Gr1/6 SEM without rubs or gallops detected. Abdomen:  Soft & nontender- no guarding or rebound; normal bowel sounds; no organomegaly or masses palpated. Ext:  Sl decr ROM; without deformities +arthritic changes; no varicose veins, +venous insuffic, tr edema;  Pulses decr w/o bruits. Neuro:  CNs II-XII intact; motor testing normal; sensory testing normal; gait normal & balance OK. Derm:  No lesions noted; no rash etc. Lymph:  No cervical, supraclavicular, axillary, or inguinal adenopathy palpated.   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     ASPVD- s/p AAA stent graft 11/2012, mod bilat carotid occlusive dis (asymptomatic) w/ CDopplers showing ~50-60% bilat ICAstenoses>  Stable & [facility]- plans yearly f/u CT Abd and CDopplers  MEDICAL issues>  HBP, HL, GERD/ Divertic/ Polyps, CKD-stage3, Anemia & thrombocytopenia>  On Simva80-1/2Qd, Prilosec20/d, Fe/ MVI/ etc...  PLAN>>  02/29/16>   Problem list as above w/ signif cardiac, pulmonary, & post chemoradiation changes;  From the pulm standpoint- he feels the BREO is helping & hasn't needed the rescue inhaler very often, continue same;  He is on a long PRED taper per DrManning for poss radiation fibrosis RUL, Sed rate is 53, continue this taper & careful w/ sweets/ sugar  etc;  From the cardiac standpoint- he has signif underlying dis w/ Lmain stent, s/p CABG 20 yrs ago, prob ischemic cardiomyopathy w/ worsening 2DEcho recently w/ 30-35% EF + HK&AK seen, BNP=1053, Cards plans ischemic work up when able, in the meanwhile he has improved w/ diuresis but Chems w/ mild RI & HCO3=36... REC- add DIAMOX-ER 500 one tab daily at 4pm, continue his Lasix40 Qam, increase water intake, watch weights... We plan ROV 57monthrecheck... 04/04/16>   Discussed w/ Paul Sandoval-- continue the Levaquin til gone, use Tylenol for fever or pain; continue the Breo daily & Ventolin-HFA as needed; wean off the Diamox at this time, continue Lasix40, no salt, etc; he needs to gradually increase his exercise/ mobility; we will plan ROV w/ labs in 210mo     Plan:     Patient's Medications  New Prescriptions   No medications on file  Previous Medications   ACETAMINOPHEN (TYLENOL) 500 MG TABLET    Take 500 mg by mouth every 6 (six) hours as needed for mild pain.   ACETAZOLAMIDE (DIAMOX SEQUELS) 500 MG CAPSULE    Take 1 capsule (500 mg total) by mouth daily. At 4pm   ASPIRIN EC 81 MG TABLET    Take 81 mg by mouth daily.   CLOPIDOGREL (PLAVIX) 75 MG TABLET    Take 1 tablet (75 mg total) by mouth daily. Reported on 12/02/2015   FERROUS SULFATE (IRON) 28 MG TABS    Take 28 mg by mouth daily.   FLUTICASONE FUROATE-VILANTEROL (BREO ELLIPTA) 100-25 MCG/INH AEPB    Inhale 1 puff into the lungs daily.   FUROSEMIDE (LASIX) 40 MG TABLET    Take 1 tablet (40 mg total) by mouth daily.   LEVOFLOXACIN (LEVAQUIN) 500 MG TABLET    Take 1 tablet (500 mg total) by mouth daily.   METOPROLOL 75 MG TABS    Take 75 mg by mouth 2 (two) times daily. Dispense as needed 1 month supply only   MULTIPLE VITAMINS-MINERALS (CENTRUM SILVER ADULT 50+) TABS    Take 1 tablet by mouth daily.    NITROGLYCERIN (NITROSTAT) 0.4 MG SL TABLET    Place 1 tablet (0.4 mg total) under the tongue every 5 (five) minutes as needed for chest pain.    OMEPRAZOLE (PRILOSEC) 20 MG CAPSULE    Take 20 mg by mouth daily.   SIMVASTATIN (ZOCOR) 80 MG TABLET    Take 0.5 tablets (40 mg total) by mouth at bedtime.   VENTOLIN HFA 108 (90 BASE) MCG/ACT INHALER    INHALE 2 PUFFS BY MOUTH EVERY 4 TO 6 HOURS AS NEEDED FOR WHEEZING.  Modified Medications   No medications on file  Discontinued Medications   No medications on file

## 2016-04-04 NOTE — Patient Instructions (Signed)
Today we updated your med list in our EPIC system...    Continue your current medications the same...  Continue the Breo or similar ICS/LABA combo regularly (OK change med at the [place])... Continue the VENTOLIN HFA rescue inhaler as needed...  Wean off the Acetazolamide (Diamox) taking one capsule every other day til the current supply is gone, then stop this med...  We discussed increasing your activity/ exercise gradually...  Call for any questions...  Let's plan a follow up visit in 38mo sooner if needed for any breathing problems..Marland KitchenMarland Kitchen

## 2016-04-05 DIAGNOSIS — M5030 Other cervical disc degeneration, unspecified cervical region: Secondary | ICD-10-CM | POA: Diagnosis not present

## 2016-04-09 ENCOUNTER — Other Ambulatory Visit: Payer: Self-pay | Admitting: Family Medicine

## 2016-04-10 ENCOUNTER — Telehealth: Payer: Self-pay | Admitting: Radiation Oncology

## 2016-04-10 NOTE — Telephone Encounter (Signed)
Per Shona Simpson, PA-C request phoned patient and inquire about hoarseness. Patient reports the short hoarseness has resolved. Reports since we last spoke Dr. Julien Nordmann "did a scan" and found the pneumonia was back. Reports he prescribed an antibiotic which he completed today. Patient explains that while he was taking the antibiotic he had intermittent hoarseness but, that has even resolved. Confirmed upcoming appointments with Dr. Julien Nordmann for the patient. Patient denies any additional needs at this time. Patient understands to call this RN with any future needs.

## 2016-04-11 ENCOUNTER — Telehealth: Payer: Self-pay

## 2016-04-11 NOTE — Telephone Encounter (Signed)
Antibiotics completed 3 days ago for "pneumonia". He states he feels better. He checked his temp and last 4 days it has been  Below 66F , Note to [facility] to see if he needs to be seen before September.

## 2016-04-11 NOTE — Telephone Encounter (Signed)
Pt had pneumonia on side of cancer. He had CXR and antibiotics. He is expecting a f/u visit and/or another CXR. He did have his pulmonologist appt and pulmonologist said to f/u with Dr Julien Nordmann. "Please call me back this morning if possible, I need to know what to do"

## 2016-05-09 ENCOUNTER — Encounter: Payer: Self-pay | Admitting: Family Medicine

## 2016-05-09 ENCOUNTER — Ambulatory Visit (INDEPENDENT_AMBULATORY_CARE_PROVIDER_SITE_OTHER): Payer: Medicare Other | Admitting: Family Medicine

## 2016-05-09 VITALS — BP 112/58 | Ht 70.0 in | Wt 180.5 lb

## 2016-05-09 DIAGNOSIS — J45909 Unspecified asthma, uncomplicated: Secondary | ICD-10-CM

## 2016-05-09 DIAGNOSIS — I10 Essential (primary) hypertension: Secondary | ICD-10-CM

## 2016-05-09 DIAGNOSIS — J449 Chronic obstructive pulmonary disease, unspecified: Secondary | ICD-10-CM | POA: Diagnosis not present

## 2016-05-09 DIAGNOSIS — Z13220 Encounter for screening for lipoid disorders: Secondary | ICD-10-CM

## 2016-05-09 DIAGNOSIS — Z23 Encounter for immunization: Secondary | ICD-10-CM | POA: Diagnosis not present

## 2016-05-09 DIAGNOSIS — E785 Hyperlipidemia, unspecified: Secondary | ICD-10-CM

## 2016-05-09 MED ORDER — METOPROLOL TARTRATE 75 MG PO TABS: 1.0000 | ORAL_TABLET | Freq: Two times a day (BID) | ORAL | 1 refills | Status: DC

## 2016-05-09 NOTE — Progress Notes (Signed)
Subjective:    Patient ID: Paul Sandoval, male    DOB: 02/14/1941, 75 y.o.   MRN: [id number]  Hypertension  This is a chronic problem. The current episode started more than 1 year ago. There are no compliance problems.   Blood pressure medicine and blood pressure levels reviewed today with patient. Compliant with blood pressure medicine. States does not miss a dose. No obvious side effects. Blood pressure generally good when checked elsewhere. Watching salt intake.  Patient continues to take lipid medication regularly. No obvious side effects from it. Generally does not miss a dose. Prior blood work results are reviewed with patient. Patient continues to work on fat intake in diet   Patient states no other concerns this visit.  Notes improvement in breathing and cough and congAlso compliant with his new inhaler. Seems to be helping his breathing.  Still weak  Results for orders placed or performed in visit on 03/28/16  Urine Culture  Result Value Ref Range   Urine Culture, Routine Final report    Urine Culture result 1 No growth   CBC with Differential  Result Value Ref Range   WBC 6.1 4.0 - 10.3 10e3/uL   NEUT# 5.5 1.5 - 6.5 10e3/uL   HGB 11.4 (L) 13.0 - 17.1 g/dL   HCT 35.6 (L) 38.4 - 49.9 %   Platelets 85 (L) 140 - 400 10e3/uL   MCV 91.0 79.3 - 98.0 fL   MCH 29.2 27.2 - 33.4 pg   MCHC 32.1 32.0 - 36.0 g/dL   RBC 3.91 (L) 4.20 - 5.82 10e6/uL   RDW 19.0 (H) 11.0 - 14.6 %   lymph# 0.3 (L) 0.9 - 3.3 10e3/uL   MONO# 0.3 0.1 - 0.9 10e3/uL   Eosinophils Absolute 0.0 0.0 - 0.5 10e3/uL   Basophils Absolute 0.0 0.0 - 0.1 10e3/uL   NEUT% 90.1 (H) 39.0 - 75.0 %   LYMPH% 4.2 (L) 14.0 - 49.0 %   MONO% 4.9 0.0 - 14.0 %   EOS% 0.7 0.0 - 7.0 %   BASO% 0.1 0.0 - 2.0 %  Comprehensive metabolic panel  Result Value Ref Range   Sodium 134 (L) 136 - 145 mEq/L   Potassium 4.0 3.5 - 5.1 mEq/L   Chloride 102 98 - 109 mEq/L   CO2 23 22 - 29 mEq/L   Glucose 145 (H) 70 - 140 mg/dl   BUN  22.2 7.0 - 26.0 mg/dL   Creatinine 1.6 (H) 0.7 - 1.3 mg/dL   Total Bilirubin 0.83 0.20 - 1.20 mg/dL   Alkaline Phosphatase 81 40 - 150 U/L   AST 23 5 - 34 U/L   ALT 31 0 - 55 U/L   Total Protein 6.7 6.4 - 8.3 g/dL   Albumin 2.8 (L) 3.5 - 5.0 g/dL   Calcium 9.2 8.4 - 10.4 mg/dL   Anion Gap 9 3 - 11 mEq/L   EGFR 42 (L) >90 ml/min/1.73 m2  Urinalysis with microscopic  Result Value Ref Range   Glucose Negative Negative mg/dL   Bilirubin (Urine) Negative Negative   Ketones Negative Negative mg/dL   Specific Gravity, Urine 1.010 1.003 - 1.035   Blood Negative Negative   pH 6.0 4.6 - 8.0   Protein Negative Negative- <30 mg/dL   Urobilinogen, UR 0.2 0.2 - 1 mg/dL   Nitrite Negative Negative   Leukocyte Esterase Trace Negative   RBC / HPF Negative 0 - 2   WBC, UA 0-2 0 - 2   Bacteria, UA  Trace Negative- Trace   Casts Hyaline Negative   Epithelial Cells Occasional Negative- Few     Ha started back to walking and exr   One mi per d sin e aug 11th  Review of Systems No headache, no major weight loss or weight gain, no chest pain no back pain abdominal pain no change in bowel habits complete ROS otherwise negative     Objective:   Physical Exam  Alert vitals stable, NAD. Blood pressure good on repeat. HEENT normal. Lungs clear. Heart regular rate and rhythm.     impression #1 lung cancer long discussion held. Patient realizes he is not to be this. Currently responding to interventions but unfortunately persists. Discussion held regarding how family is accepting all this #2 COPD fair control #3 hypertension good control discussed maintain same meds #4 hyperlipidemia status uncertain the check appropriate blood work. Recheck in several months diet exercise within patient's limits discussed . Flu shot today Assessment & Plan:

## 2016-05-10 ENCOUNTER — Other Ambulatory Visit (HOSPITAL_BASED_OUTPATIENT_CLINIC_OR_DEPARTMENT_OTHER): Payer: Medicare Other

## 2016-05-10 ENCOUNTER — Ambulatory Visit (HOSPITAL_COMMUNITY)
Admission: RE | Admit: 2016-05-10 | Discharge: 2016-05-10 | Disposition: A | Discharge status: Home / Self Care | Payer: Medicare Other | Source: Ambulatory Visit | Attending: Internal Medicine | Admitting: Internal Medicine

## 2016-05-10 ENCOUNTER — Telehealth: Payer: Self-pay | Admitting: Medical Oncology

## 2016-05-10 ENCOUNTER — Ambulatory Visit: Payer: Medicare Other | Admitting: Family Medicine

## 2016-05-10 ENCOUNTER — Encounter (HOSPITAL_COMMUNITY): Payer: Self-pay

## 2016-05-10 DIAGNOSIS — C3411 Malignant neoplasm of upper lobe, right bronchus or lung: Secondary | ICD-10-CM | POA: Insufficient documentation

## 2016-05-10 DIAGNOSIS — E785 Hyperlipidemia, unspecified: Secondary | ICD-10-CM

## 2016-05-10 DIAGNOSIS — J90 Pleural effusion, not elsewhere classified: Secondary | ICD-10-CM | POA: Diagnosis not present

## 2016-05-10 DIAGNOSIS — C3490 Malignant neoplasm of unspecified part of unspecified bronchus or lung: Secondary | ICD-10-CM | POA: Diagnosis not present

## 2016-05-10 LAB — COMPREHENSIVE METABOLIC PANEL
ALBUMIN: 3.4 g/dL — AB (ref 3.5–5.0)
ALK PHOS: 89 U/L (ref 40–150)
ALT: 12 U/L (ref 0–55)
AST: 19 U/L (ref 5–34)
Anion Gap: 9 mEq/L (ref 3–11)
BILIRUBIN TOTAL: 0.67 mg/dL (ref 0.20–1.20)
BUN: 14.5 mg/dL (ref 7.0–26.0)
CO2: 26 meq/L (ref 22–29)
Calcium: 9.7 mg/dL (ref 8.4–10.4)
Chloride: 106 mEq/L (ref 98–109)
Creatinine: 1.5 mg/dL — ABNORMAL HIGH (ref 0.7–1.3)
EGFR: 45 mL/min/{1.73_m2} — ABNORMAL LOW (ref >90)
GLUCOSE: 96 mg/dL (ref 70–140)
Potassium: 4.2 mEq/L (ref 3.5–5.1)
SODIUM: 141 meq/L (ref 136–145)
TOTAL PROTEIN: 7.6 g/dL (ref 6.4–8.3)

## 2016-05-10 LAB — CBC WITH DIFFERENTIAL/PLATELET
BASO%: 0.6 % (ref 0.0–2.0)
Basophils Absolute: 0.1 10*3/uL (ref 0.0–0.1)
EOS ABS: 0.1 10*3/uL (ref 0.0–0.5)
EOS%: 0.7 % (ref 0.0–7.0)
HCT: 36.4 % — ABNORMAL LOW (ref 38.4–49.9)
HEMOGLOBIN: 11.8 g/dL — AB (ref 13.0–17.1)
LYMPH%: 7.8 % — ABNORMAL LOW (ref 14.0–49.0)
MCH: 29.5 pg (ref 27.2–33.4)
MCHC: 32.5 g/dL (ref 32.0–36.0)
MCV: 90.8 fL (ref 79.3–98.0)
MONO#: 0.8 10*3/uL (ref 0.1–0.9)
MONO%: 8.2 % (ref 0.0–14.0)
NEUT%: 82.7 % — ABNORMAL HIGH (ref 39.0–75.0)
NEUTROS ABS: 8.4 10*3/uL — AB (ref 1.5–6.5)
Platelets: 163 10*3/uL (ref 140–400)
RBC: 4.01 10*6/uL — AB (ref 4.20–5.82)
RDW: 17.4 % — AB (ref 11.0–14.6)
WBC: 10.2 10*3/uL (ref 4.0–10.3)
lymph#: 0.8 10*3/uL — ABNORMAL LOW (ref 0.9–3.3)

## 2016-05-10 MED ORDER — IOPAMIDOL (ISOVUE-300) INJECTION 61%
75.0000 mL | Freq: Once | INTRAVENOUS | Status: AC | PRN
  Administered 2016-05-10: 75 mL via INTRAVENOUS

## 2016-05-10 NOTE — Telephone Encounter (Signed)
I called to request lab order for test that Dr Wolfgang Phoenix wants done.

## 2016-05-16 ENCOUNTER — Telehealth: Payer: Self-pay | Admitting: *Deleted

## 2016-05-16 ENCOUNTER — Encounter: Payer: Self-pay | Admitting: Internal Medicine

## 2016-05-16 ENCOUNTER — Other Ambulatory Visit: Payer: Self-pay | Admitting: Internal Medicine

## 2016-05-16 ENCOUNTER — Encounter: Payer: Self-pay | Admitting: *Deleted

## 2016-05-16 ENCOUNTER — Telehealth: Payer: Self-pay | Admitting: Internal Medicine

## 2016-05-16 ENCOUNTER — Ambulatory Visit (HOSPITAL_BASED_OUTPATIENT_CLINIC_OR_DEPARTMENT_OTHER): Payer: Medicare Other | Admitting: Internal Medicine

## 2016-05-16 VITALS — BP 130/62 | HR 81 | Temp 97.8°F | Resp 17 | Ht 70.0 in | Wt 179.2 lb

## 2016-05-16 DIAGNOSIS — C3411 Malignant neoplasm of upper lobe, right bronchus or lung: Secondary | ICD-10-CM | POA: Diagnosis not present

## 2016-05-16 DIAGNOSIS — E785 Hyperlipidemia, unspecified: Secondary | ICD-10-CM

## 2016-05-16 NOTE — Telephone Encounter (Signed)
Avs report and appointment schedule given to patient,per 05/16/16 los.

## 2016-05-16 NOTE — Progress Notes (Signed)
[facility] [phone]   [phone]  OFFICE PROGRESS NOTE  [name], MD [place] [address]  DIAGNOSIS: Stage IIIA (T2b, N2, M0) non-small cell lung cancer, favoring squamous cell carcinoma presented with right upper lobe lung mass in addition to mediastinal lymphadenopathy diagnosed in March 2017.  PRIOR THERAPY:  A course of concurrent chemoradiation with weekly carboplatin for AUC of 2 and paclitaxel 45 MG/M2. Status post 5 cycle with partial response.  CURRENT THERAPY: Observation  INTERVAL HISTORY: Paul Sandoval 75 y.o. male returns to the clinic today for follow-up visit. The patient is feeling much better today compared to a few weeks ago. He has more energy and exercises at regular basis. He denied having any significant nausea or vomiting. He has no fever or chills. The patient denied having any significant chest pain, shortness of breath, cough or hemoptysis. He has no dysphagia or odynophagia. He had repeat CT scan of the chest performed recently and he is here for evaluation and discussion of his scan results.  MEDICAL HISTORY: Past Medical History:  Diagnosis Date  . AAA (abdominal aortic aneurysm) (Paul Sandoval) 2010   4.4 cm 08/2008;4.44 in 7/10 and 4.65 in 08/2009; 4.8 by CT in 11/2009; 4.3 by ultrasound in 08/2010  . Arteriosclerotic cardiovascular disease (ASCVD) 1996   CABG-1996  . Arthritis    "fingers" (03/18/2014)  . CAD (coronary artery disease)    03/18/14:  PCI with DES to distal left main. 7/29: DES to the SVG to Diag  . Cancer (Paul Sandoval)    Upper right lobe lung cancer  . Cardiomyopathy, ischemic    Echo 03/17/14: EF 45-50%  . Chronic bronchitis (Paul Sandoval)   . Chronic rhinitis   . Colonic polyp 2002   polypectomy in 2002  . COPD (chronic obstructive pulmonary disease) (Paul Sandoval)   . Diverticulosis   . ED (erectile dysfunction)   . Encounter for antineoplastic chemotherapy 12/19/2015  . GERD (gastroesophageal  reflux disease)   . Hyperlipidemia   . Hypertension   . IFG (impaired fasting glucose)   . Myocardial infarction Paul Sandoval)    "told h/o silent MI sometime before 1996"  . Pneumonia ~ 2001; ~ 2005  . Right bundle branch block   . Tobacco abuse, in remission    40 pack year total consumption; discontinued in 1996    ALLERGIES:  is allergic to neomycin.  MEDICATIONS:  Current Outpatient Prescriptions  Medication Sig Dispense Refill  . acetaminophen (TYLENOL) 500 MG tablet Take 500 mg by mouth every 6 (six) hours as needed for mild pain.    Marland Kitchen aspirin EC 81 MG tablet Take 81 mg by mouth daily.    . budesonide-formoterol (SYMBICORT) 160-4.5 MCG/ACT inhaler Inhale 2 puffs into the lungs 2 (two) times daily.    . clopidogrel (PLAVIX) 75 MG tablet Take 1 tablet (75 mg total) by mouth daily. Reported on 12/02/2015 90 tablet 3  . Ferrous Sulfate (IRON) 28 MG TABS Take 28 mg by mouth daily.    . furosemide (LASIX) 40 MG tablet Take 1 tablet (40 mg total) by mouth daily. 30 tablet 11  . Metoprolol Tartrate 75 MG TABS Take 1 tablet by mouth 2 (two) times daily. 180 tablet 1  . Multiple Vitamins-Minerals (CENTRUM SILVER ADULT 50+) TABS Take 1 tablet by mouth daily.     . nitroGLYCERIN (NITROSTAT) 0.4 MG SL tablet Place 1 tablet (0.4 mg total) under the tongue every 5 (five) minutes as  needed for chest pain. 25 tablet 4  . omeprazole (PRILOSEC) 20 MG capsule TAKE ONE CAPSULE BY MOUTH DAILY. 90 capsule 0  . simvastatin (ZOCOR) 80 MG tablet Take 0.5 tablets (40 mg total) by mouth at bedtime. 15 tablet 5  . VENTOLIN HFA 108 (90 Base) MCG/ACT inhaler INHALE 2 PUFFS BY MOUTH EVERY 4 TO 6 HOURS AS NEEDED FOR WHEEZING. (Patient not taking: Reported on 05/09/2016) 18 g 5   No current facility-administered medications for this visit.     SURGICAL HISTORY:  Past Surgical History:  Procedure Laterality Date  . ABDOMINAL AORTIC ANEURYSM REPAIR  11/2012  . ABDOMINAL AORTIC ENDOVASCULAR STENT GRAFT N/A 12/11/2012    Procedure: ABDOMINAL AORTIC ENDOVASCULAR STENT GRAFT;  Surgeon: Mal Misty, MD;  Location: [facility];  Service: Vascular;  Laterality: N/A;  Ultrasound guided; [place]  . CARDIAC CATHETERIZATION  01/08/1995  . COLONOSCOPY  2002   polypectomy-patient denies  . CORONARY ANGIOPLASTY WITH STENT PLACEMENT  03/18/2014   "1"  . CORONARY ANGIOPLASTY WITH STENT PLACEMENT  03/24/2014   "1"  . CORONARY ARTERY BYPASS GRAFT  01/09/1995   "CABG X3"  . JOINT REPLACEMENT    . LAPAROSCOPIC CHOLECYSTECTOMY  12/2009  . LEFT AND RIGHT HEART CATHETERIZATION WITH CORONARY/GRAFT ANGIOGRAM N/A 03/18/2014   Procedure: LEFT AND RIGHT HEART CATHETERIZATION WITH Paul [facility]oval;  Surgeon: Blane Ohara, MD;  Location[facility] CATH LAB;  Service: Cardiovascular;  Laterality: N/A;  . PERCUTANEOUS CORONARY STENT INTERVENTION (PCI-S)  03/18/2014   Procedure: PERCUTANEOUS CORONARY STENT INTERVENTION (PCI-S);  Surgeon: Blane Ohara, MD;  Location: [facility] CATH LAB;  Service: Cardiovascular;;  . PERCUTANEOUS CORONARY STENT INTERVENTION (PCI-S) N/A 03/24/2014   Procedure: PERCUTANEOUS CORONARY STENT INTERVENTION (PCI-S);  Surgeon: Blane Ohara, MD;  Location: [facility] CATH LAB;  Service: Cardiovascular;  Laterality: N/A;  . TOTAL HIP ARTHROPLASTY Left 01/21/2013   Procedure: TOTAL HIP ARTHROPLASTY ANTERIOR APPROACH;  Surgeon: Mauri Pole, MD;  Location: [facility];  Service: Orthopedics;  Laterality: Left;  Marland Kitchen VIDEO BRONCHOSCOPY N/A 11/17/2015   Procedure: VIDEO BRONCHOSCOPY WITH FLUORO;  Surgeon: Paul Noel, MD;  Location: [facility];  Service: Cardiopulmonary;  Laterality: N/A;    REVIEW OF SYSTEMS:  A comprehensive review of systems was negative.   PHYSICAL EXAMINATION: General appearance: alert, cooperative and no distress Head: Normocephalic, without obvious abnormality, atraumatic Neck: no adenopathy, no JVD, supple, symmetrical, trachea midline and thyroid not enlarged, symmetric, no tenderness/mass/nodules Lymph nodes:  Cervical, supraclavicular, and axillary nodes normal. Resp: clear to auscultation bilaterally Back: symmetric, no curvature. ROM normal. No CVA tenderness. Cardio: regular rate and rhythm, S1, S2 normal, no murmur, click, rub or gallop GI: soft, non-tender; bowel sounds normal; no masses,  no organomegaly Extremities: extremities normal, atraumatic, no cyanosis or edema Neurologic: Alert and oriented X 3, normal strength and tone. Normal symmetric reflexes. Normal coordination and gait  ECOG PERFORMANCE STATUS: 1 - Symptomatic but completely ambulatory  Blood pressure 130/62, pulse 81, temperature 97.8 F (36.6 C), temperature source Oral, resp. rate 17, height '5\' 10"'$  (1.778 m), weight 179 lb 3.2 oz (81.3 kg), SpO2 97 %.  LABORATORY DATA: Lab Results  Component Value Date   WBC 10.2 05/10/2016   HGB 11.8 (L) 05/10/2016   HCT 36.4 (L) 05/10/2016   MCV 90.8 05/10/2016   PLT 163 05/10/2016      Chemistry      Component Value Date/Time   NA 141 05/10/2016 1105   K 4.2 05/10/2016 1105   CL 96  02/29/2016 1218   CO2 26 05/10/2016 1105   BUN 14.5 05/10/2016 1105   CREATININE 1.5 (H) 05/10/2016 1105      Component Value Date/Time   CALCIUM 9.7 05/10/2016 1105   ALKPHOS 89 05/10/2016 1105   AST 19 05/10/2016 1105   ALT 12 05/10/2016 1105   BILITOT 0.67 05/10/2016 1105       RADIOGRAPHIC STUDIES: Ct Chest W Contrast  Result Date: 05/10/2016 CLINICAL DATA:  Lung cancer EXAM: CT CHEST WITH CONTRAST TECHNIQUE: Multidetector CT imaging of the chest was performed during intravenous contrast administration. CONTRAST:  89m ISOVUE-300 IOPAMIDOL (ISOVUE-300) INJECTION 61% COMPARISON:  02/24/2016 FINDINGS: Cardiovascular: The heart size is normal. No pericardial effusion. Coronary artery calcification is noted. Patient is status post CABG. Mediastinum/Nodes: Stable borderline enlarged 11 mm short axis subcarinal lymph node. Other scattered small mediastinal lymph nodes are stable. No left  hilar lymphadenopathy. Abnormal soft tissue attenuation in the right hilum is progressed, but may be treatment related. Lungs/Pleura: More confluent airspace disease with associated volume loss noted in the right upper lobe. The patchy airspace disease in the superior segment right lower lobe has improved in the interval. Small right pleural effusion seen previously is now moderate in size. 20 x 18 mm irregular nodule seen medial right upper lobe previously now measures 20 x 12 mm. Peripheral interstitial and patchy airspace disease left lung is slightly progressed in the interval, nonspecific. No left pleural effusion. Upper Abdomen: Tiny hiatal hernia. Musculoskeletal: Bone windows reveal no worrisome lytic or sclerotic osseous lesions. IMPRESSION: More confluent airspace disease right upper low with associated increase in right volume loss. Abnormal right parahilar soft tissue may be treatment related. Increasing right pleural effusion.  Close attention recommended. Electronically Signed   By: EMisty StanleyM.D.   On: 05/10/2016 16:00    ASSESSMENT AND PLAN: This is a very pleasant 75years old white male recently diagnosed with a stage IIIA non-small cell lung cancer status post a course of concurrent chemoradiation with weekly carboplatin and paclitaxel status post 5 cycles. He tolerated his treatment well except for the recent admission to the hospital with recurrent unexplained fever which completely resolved at this point.  The recent CT scan of the chest showed no concerning findings for disease progression. I discussed the scan results with the patient today. I recommended for him to continue on observation with repeat CT scan of the chest in 3 months. The patient was advised to call immediately if he has any concerning symptoms in the interval. The patient voices understanding of current disease status and treatment options and is in agreement with the current care plan.  All questions were  answered. The patient knows to call the clinic with any problems, questions or concerns. We can certainly see the patient much sooner if necessary.  Disclaimer: This note was dictated with voice recognition software. Similar sounding words can inadvertently be transcribed and may not be corrected upon review.

## 2016-05-16 NOTE — Telephone Encounter (Signed)
Oncology Nurse Navigator Documentation  Oncology Nurse Navigator Flowsheets 03/28/2016  Navigator Encounter Type Telephone/I called Paul Sandoval to update him regarding lab work.    Telephone Outgoing Call  Treatment Phase Post-Tx Follow-up  Barriers/Navigation Needs Coordination of Care  Interventions Coordination of Care  Coordination of Care Appts  Acuity Level 2  Time Spent with Patient 30

## 2016-05-17 LAB — LIPID PANEL
CHOLESTEROL TOTAL: 158 mg/dL (ref 100–199)
Chol/HDL Ratio: 4.3 ratio units (ref 0.0–5.0)
HDL: 37 mg/dL — ABNORMAL LOW (ref >39)
LDL CALC: 90 mg/dL (ref 0–99)
Triglycerides: 157 mg/dL — ABNORMAL HIGH (ref 0–149)
VLDL Cholesterol Cal: 31 mg/dL (ref 5–40)

## 2016-06-06 ENCOUNTER — Ambulatory Visit (INDEPENDENT_AMBULATORY_CARE_PROVIDER_SITE_OTHER): Payer: Medicare Other | Admitting: Pulmonary Disease

## 2016-06-06 ENCOUNTER — Encounter: Payer: Self-pay | Admitting: Pulmonary Disease

## 2016-06-06 ENCOUNTER — Ambulatory Visit (INDEPENDENT_AMBULATORY_CARE_PROVIDER_SITE_OTHER)
Admission: RE | Admit: 2016-06-06 | Discharge: 2016-06-06 | Disposition: A | Discharge status: Home / Self Care | Payer: Medicare Other | Source: Ambulatory Visit | Attending: Pulmonary Disease | Admitting: Pulmonary Disease

## 2016-06-06 VITALS — BP 130/72 | HR 60 | Temp 97.0°F | Ht 70.0 in | Wt 183.1 lb

## 2016-06-06 DIAGNOSIS — J449 Chronic obstructive pulmonary disease, unspecified: Secondary | ICD-10-CM

## 2016-06-06 DIAGNOSIS — J4489 Other specified chronic obstructive pulmonary disease: Secondary | ICD-10-CM

## 2016-06-06 DIAGNOSIS — C3411 Malignant neoplasm of upper lobe, right bronchus or lung: Secondary | ICD-10-CM

## 2016-06-06 DIAGNOSIS — I714 Abdominal aortic aneurysm, without rupture, unspecified: Secondary | ICD-10-CM

## 2016-06-06 DIAGNOSIS — I2510 Atherosclerotic heart disease of native coronary artery without angina pectoris: Secondary | ICD-10-CM | POA: Diagnosis not present

## 2016-06-06 DIAGNOSIS — I255 Ischemic cardiomyopathy: Secondary | ICD-10-CM

## 2016-06-06 DIAGNOSIS — I5043 Acute on chronic combined systolic (congestive) and diastolic (congestive) heart failure: Secondary | ICD-10-CM

## 2016-06-06 DIAGNOSIS — I679 Cerebrovascular disease, unspecified: Secondary | ICD-10-CM

## 2016-06-06 DIAGNOSIS — I2583 Coronary atherosclerosis due to lipid rich plaque: Secondary | ICD-10-CM

## 2016-06-06 DIAGNOSIS — N183 Chronic kidney disease, stage 3 unspecified: Secondary | ICD-10-CM

## 2016-06-06 NOTE — Patient Instructions (Addendum)
Today we updated your med list in our EPIC system...    Continue your current medications the same...  Today we rechecked a follow up CXR to compare to your last film & recent CT scan...    We will contact you w/ the results when available...   Keep up the good work w/ your exercise program...  Call for any questions...  Let's plan a follow up visit in 3-100mo sooner if needed for breathing problems..Marland KitchenMarland Kitchen

## 2016-06-06 NOTE — Progress Notes (Signed)
Subjective:     Patient ID: Paul Sandoval, male   DOB: Jan 21, 1941, 75 y.o.   MRN: [id number]  HPI  ~  February 29, 2016:  Tracy w/ SN>  Fitzhugh is a 75 y/o WF w/ mult medical problems as noted below who has seen DrAlva & DrMannam previously;  I have reviewed his extensive epic records and recent [place] x2 in June2017 and formulated the following PROBLEM LIST>>  His PCP is DrLuking in [place]... He reminded me that I cared for his dad in 59...    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    ~  [place] [place]>  Fever, cough, increased RUL opac (PCT<0.10, he received 10d Levaquin) & right>left effusion w/ thoracentesis 02/22/16 removing 1200cc clear yellow fluid, prob transudate, & BNP was >900; meds adjusted & he was diuresed w/ improvement but 2DEcho revealed worsening CHF w/ EF ~30-35% + HK & AK => he has f/u pending w/ Cards...    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    ASPVD- s/p AAA stent graft 11/2012, mod bilat carotid occlusive dis (asymptomatic) w/ CDopplers showing ~50-60% bilat ICAstenoses>  [facility] plans yearly f/u CT Abd and CDopplers    MEDICAL issues>  HBP, HL, GERD/ Divertic/ Polyps, CKD-stage3, Anemia & thrombocytopenia>  On Simva80-1/2Qd, Prilosec20/d, Fe/ MVI/ etc... Since he was disch 02/25/16>  He reports feeling better, breathing better, still sl SOB w/ activ but not requiring O2, mild cough w/ beige sput, no hemoptysis, denies CP=> he is due for f/u CXR & blood work post hosp... EXAM shows Afeb, VSS, O2sat=95% on RA after walking back; Wt=180#;  HEENT- neg, mallampati2;  Chest- sl decr BS right base, few rhonchi, w/o w/r/consolidation;  Heart- RR Gr1/6SEM w/o r/g;  Abd- soft, neg;  Ext- neg w/o c/c/e...  PFT 02/01/16>  FVC=1.96 (45%), FEV1=1.25 (40%), %1sec=64, mid-flows were reduced at 28% predicted; post bronchodil there was a 31% improvement in FEV1 to 1.64;  TLC=5.96 (83%, RV=3.89 (  149%), RV/TLC=65%;  DLCO=58% pred.  This is c/w moderate to severe airflow obstruction, GOLD Stage 3 COPD w/ a signif asthmatic component, air trapping, and decr diffusion capacity...  2DEcho 02/22/16>  Mild conc LVH w/ decr LVF & EF=30-35% w/ diffuse HK & AK in several walls (see report), Gr2 DD, mild MR otherw norm valves, mild LAdil (27m), norm RV function & PA pressures  LABS 01/2016 Hosp>  Chems- ok x Cr=1.3-1.4, BS=120-140, Alb=2.8;  CBC- anemic w/ Hg=8-9 range, Plat~130K range;  TSH=0.92  CT Chest 02/21/16>  Norm heart size, extensive coronary calcif, s/p CABG, decr size of the pathological  right paratracheal LN, decr size of RUL mass (now ~2cm & prev ~4cm), new confluent airsp opac w/ air bronchograms in RUL w/ large right effusion; s/p GB, +HH, Abd Ao stent graft, DJD & DISH w/o metastatic lesions  CT Angio Chest 02/24/16>  NEG for PE, s/pCABG & stent in Lmain, norm heart size, no pericard fluid, small right pleural effusion, architectural distortion & interstitial opacities in RUL c/w XRT changes, RUL nodule measures ~2cm, no adenopathy reported...   CXR 02/29/16>  Stable heart size, tortuous Ao, s/p CABG, sl improvement in interstitial opac in RUL area & posteromedial RUL nodule  LABS 02/29/16>  Chems- ok x HCO3=36, BS=120, BUN/Cr=30/1.40;  CBC- Hg=10.9, Plat=249K, WBC=18.2 (on Pred);  BNP=1053;  Sed=53 IMP/PLAN>>  Problem list as above w/ signif cardiac, pulmonary, & post chemoradiation changes;  From the pulm standpoint- he feels the BREO is helping & hasn't needed the rescue inhaler very often, continue same;  He is on a long PRED taper per DrManning for poss radiation fibrosis RUL, Sed rate is 53, continue this taper & careful w/ sweets/ sugar etc;  From the cardiac standpoint- he has signif underlying dis w/ Lmain stent, s/p CABG 20 yrs ago, prob ischemic cardiomyopathy w/ worsening 2DEcho recently w/ 30-35% EF + HK&AK seen, BNP=1053, Cards plans ischemic work up when able, in the meanwhile he has improved w/ diuresis but Chems w/ mild RI & HCO3=36... REC- add DIAMOX-ER 500 one tab daily at 4pm, continue his Lasix40 Qam, increase water intake, watch weights... We plan ROV 134monthecheck...   ~  April 04, 2016:  62m30moV w/ SN>> BilIslamlled the Oncology symptom management clinic 8/4 w/ intermittent cough, wheezing, fever; they did a f/u CXR interpreted as showing a right base opac suspicious for acute pneumonia but this is a soft finding on my review of the XRay, and the prev RUL reticulonod opac is improved (radiation fibrosis improved w/ Pred taper); placed on Levaquin Rx;  He denies  sput production, hemoptysis, CP, or SOB but he is fairly sedentary.    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    ~  [place] in 01/2016>  Fever, cough, increased RUL opac (PCT<0.10, he received 10d Levaquin) & right>left effusion w/ thoracentesis 02/22/16 removing 1200cc clear yellow fluid, prob transudate, & BNP was >900; meds adjusted & he was diuresed w/ improvement but 2DEcho revealed worsening CHF w/ EF ~30-35% + HK & AK => he has f/u pending w/ Cards...    ~  [facility]  He had right pleural effusion tapped 02/22/16=> Prob TRANSUDATE w/ TProt<3.0, LDH=95, Cytology=NEG (reactive mesothelial cells only);  Cults=> no growth    ~  03/2016 developed cough/ wheezing/ low grade fever=> CXR w/ improved RUL, ?incr markings R base, he was off the Pred, given more Levaquin...     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    ~  He saw CARDS PA 03/01/16> no change in meds, Lexiscan Myoview 03/06/16 showed hi risk study w/ EF=30%, no ST segm changes, c/w antero-apical MI & no evid reversible ischemia...    ASPVD- s/p AAA stent graft 11/2012, mod bilat carotid occlusive dis (asymptomatic) w/ CDopplers showing ~50-60% bilat ICAstenoses>  [facility] plans yearly f/u CT Abd and CDopplers    MEDICAL issues>  HBP, HL, GERD/ Divertic/ Polyps, CKD-stage3, Anemia & thrombocytopenia>  On Simva80-1/2Qd, Prilosec20/d, Fe/ MVI/ etc... EXAM shows Afeb, VSS, O2sat=99% on RA; Wt=178#;  HEENT- neg, mallampati2;  Chest- sl decr BS right base, few rhonchi, w/o w/r/consolidation;  Heart- RR Gr1/6SEM w/o r/g;  Abd- soft, neg;  Ext- neg w/o c/c/e...  Lexiscan Myoview 03/06/16 showed hi risk study w/ EF=30%, no ST segm changes, c/w antero-apical MI & no evid reversible ischemia...  CXR 03/30/16>  incr patchy opac at the right base, no effusion, RUL reticulonodular opac in RUL has regressed; norm heart size s/p CABG, abd Ao endograft part vis  LABS 03/28/16>  Chems- ok w/ K=4.0, HCO3=23, Cr=1.6, BS=145;  CBC- ok w/ Hg=11.4, wbc=6.1, plat=85K;   IMP/PLAN>>  Discussed w/ Ridge-- continue the Levaquin til gone, use Tylenol for fever or pain; continue the Breo daily & Ventolin-HFA as needed; wean off the Diamox at this time, continue Lasix40, no salt, etc; he needs to gradually increase his exercise/ mobility; we will plan ROV w/ labs in 2174mo.   ~  June 06, 2016:  74m41moV w/ SN>  BilCurbyports that he is feeling better, gaining strength, walking 74mi91m mowing yard, and resting well at night;  He notes min hacking cough, no sput- no discoloration or blood, SOB is diminished & he denies CP, no f/c/s... He saw DrMohamed 05/16/16- pt remains on observation (s/p chemoradiation, 5 cycles w/ partial  response); repeat CT Chest 05/10/16 showed more confluent airsp dis in RUL w/ vol loss, abn soft tissue attenuation in right hilum has progressed (?felt to be treatment related), stable subcarinal LN ~11mm57mderate right effusion... DrMohamed felt this scan showed no concerningfeatures of dis progression & they opted for continued  observ w/ f/u 35mo.. 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    ~  [place] 01/[place]>  Fever, cough, increased RUL opac (PCT<0.10, he received 10d Levaquin) & right>left effusion w/ thoracentesis 02/22/16 removing 1200cc clear yellow fluid, prob transudate, & BNP was >900; meds adjusted & he was diuresed w/ improvement but 2DEcho revealed worsening CHF w/ EF ~30-35% + HK & AK => he has f/u pending w/ Cards...    ~  [facility]  He had right pleural effusion tapped 02/22/16=> Prob TRANSUDATE w/ TProt<3.0, LDH=95, Cytology=NEG (reactive mesothelial cells only);  Cults=> no growth    ~  03/2016 developed cough/ wheezing/ low grade fever=> CXR w/ improved RUL, ?incr markings R base,  he was off the Pred, given more Levaquin...     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    ~  He saw CARDS PA 03/01/16> no change in meds, Lexiscan Myoview 03/06/16 showed hi risk study w/ EF=30%, no ST segm changes, c/w antero-apical MI & no evid reversible ischemia...    ASPVD- s/p AAA stent graft 11/2012, mod bilat carotid occlusive dis (asymptomatic) w/ CDopplers showing ~50-60% bilat ICAstenoses>  [facility]- plans yearly f/u CT Abd and CDopplers    MEDICAL issues>  HBP, HL, GERD/ Divertic/ Polyps, CKD-stage3, Anemia & thrombocytopenia>  On Simva80-1/2Qd, Prilosec20/d, Fe/ MVI etc... EXAM shows Afeb, VSS, O2sat=95% on RA; Wt=183# (up 5#);  HEENT- neg, mallampati2;  Chest- sl decr BS right base, few rhonchi, w/o w/r/consolidation;  Heart- RR Gr1/6SEM w/o r/g;  Abd- soft, neg;  Ext- neg w/o c/c/e...  CT Chest 05/10/16 showed more confluent airsp dis in RUL w/ vol loss, abn soft tissue attenuation in right hilum has progressed (?felt to be treatment related), stable subcarinal LN ~1160mmoderate right effusion.  CXR 06/06/16> progressive incr density in RUL & vol loss, soft tissue fullness in right hilum, s/p CABG and calcif in wall of Ao arch  PET scan 06/15/16> persistent but diminished hypermetabolism in the RUL area of interstitial & airsp  opac- likely related to XRT, no hypermetabolism in the prev R paratrachial LN, similar sized subcarinal LN w/ low level hypermetabolism noted; new area of LUL anterior  interstitial & airsp dis w/ some bronchiectasis shows low level hypermetabolism (?radiation change?); moderate right effusion...  IMP/PLAN>>  He is feeling better, performance status improving, CXR shows progressive changes in RUL s/p chemoradiation; CT=>PET scans w/ vol loss & persistent hypermetabolism/ evolving changes in RUL & right hilum plus he has a mod large right effusion; prev thoracentesis 01/2016 in hosp was transudative- I believe he would benefit from further eval/ repeat thoracentesis to recheck this fluid; we will set this up via IR & ask them to drain the fluid as much as poss, send it for Cytology, cell ct & diff, TProt/ LDH/ Gluc...    Past Medical History:  Diagnosis Date  . AAA (abdominal aortic aneurysm) ([place]) 2010   4.4 cm 08/2008;4.44 in 7/10 and 4.65 in 08/2009; 4.8 by CT in 11/2009; 4.3 by ultrasound in 08/2010  . Arteriosclerotic cardiovascular disease (ASCVD) 1996   CABG-1996  . Arthritis    "fingers" (03/18/2014)  . CAD (coronary artery disease)    03/18/14:  PCI with DES to distal left main. 7/29: DES to the SVG to Diag  . Cancer ([place])    Upper right lobe lung cancer  . Cardiomyopathy, ischemic    Echo 03/17/14: EF 45-50%  . Chronic bronchitis ([place])   . Chronic rhinitis   . Colonic polyp 2002   polypectomy in 2002  . COPD (chronic obstructive pulmonary disease) ([place])   . Diverticulosis   . ED (erectile dysfunction)   . Encounter for antineoplastic chemotherapy 12/19/2015  . GERD (gastroesophageal reflux disease)   . Hyperlipidemia   . Hypertension   . IFG (impaired fasting glucose)   . Myocardial infarction    "told h/o silent MI sometime before 1996"  . Pneumonia ~ 2001; ~ 2005  . Right bundle branch block   . Tobacco abuse, in remission    40 pack year total consumption; discontinued in 1996    Past Surgical History:  Procedure Laterality Date  . ABDOMINAL AORTIC ANEURYSM REPAIR  11/2012  . ABDOMINAL AORTIC ENDOVASCULAR STENT GRAFT N/A 12/11/2012    Procedure: ABDOMINAL AORTIC ENDOVASCULAR STENT GRAFT;  Surgeon: Mal Misty, MD;  Location: [facility];  Service: Vascular;  Laterality: N/A;  Ultrasound guided; [place]  . CARDIAC CATHETERIZATION  01/08/1995  . COLONOSCOPY  2002   polypectomy-patient denies  . CORONARY ANGIOPLASTY WITH STENT PLACEMENT  03/18/2014   "1"  . CORONARY ANGIOPLASTY WITH STENT PLACEMENT  03/24/2014   "1"  . CORONARY ARTERY BYPASS GRAFT  01/09/1995   "CABG X3"  . JOINT REPLACEMENT    . LAPAROSCOPIC CHOLECYSTECTOMY  12/2009  . LEFT AND RIGHT HEART CATHETERIZATION WITH CORONARY/GRAFT ANGIOGRAM N/A 03/18/2014   Procedure: LEFT AND RIGHT HEART CATHETERIZATION WITH Beatrix Fetters;  Surgeon: Blane Ohara, MD;  Location: [facility] CATH LAB;  Service: Cardiovascular;  Laterality: N/A;  . PERCUTANEOUS CORONARY STENT INTERVENTION (PCI-S)  03/18/2014   Procedure: PERCUTANEOUS CORONARY STENT INTERVENTION (PCI-S);  Surgeon: Blane Ohara, MD;  Location: [facility] C[facility] LAB;  Service: Cardiovascular;;  . PERCUTANEOUS CORONARY STENT INTERVENTION (PCI-S) N/A 03/24/2014   Procedure: PERCUTANEOUS CORONARY STENT INTERVENTION (PCI-S);  Surgeon: Blane Ohara, MD;  Location: [facility] CATH LAB;  Service: Cardiovascular;  Laterality: N/A;  . TOTAL HIP ARTHROPLASTY Left 01/21/2013   Procedure: TOTAL HIP ARTHROPLASTY ANTERIOR APPROACH;  Surgeon: Mauri Pole, MD;  Location:  [facility] OR;  Service: Orthopedics;  Laterality: Left;  Marland Kitchen VIDEO BRONCHOSCOPY N/A 11/17/2015   Procedure: VIDEO BRONCHOSCOPY WITH FLUORO;  Surgeon: Rigoberto Noel, MD;  Location: [place];  Service: Cardiopulmonary;  Laterality: N/A;    Outpatient Encounter Prescriptions as of 06/06/2016  Medication Sig  . acetaminophen (TYLENOL) 500 MG tablet Take 500 mg by mouth every 6 (six) hours as needed for mild pain.  Marland Kitchen aspirin EC 81 MG tablet Take 81 mg by mouth daily.  . budesonide-formoterol (SYMBICORT) 160-4.5 MCG/ACT inhaler Inhale 2 puffs into the lungs 2 (two) times daily.  .  clopidogrel (PLAVIX) 75 MG tablet Take 1 tablet (75 mg total) by mouth daily. Reported on 12/02/2015  . Ferrous Sulfate (IRON) 28 MG TABS Take 28 mg by mouth daily.  . furosemide (LASIX) 40 MG tablet Take 1 tablet (40 mg total) by mouth daily.  . Metoprolol Tartrate 75 MG TABS Take 1 tablet by mouth 2 (two) times daily.  . Multiple Vitamins-Minerals (CENTRUM SILVER ADULT 50+) TABS Take 1 tablet by mouth daily.   . nitroGLYCERIN (NITROSTAT) 0.4 MG SL tablet Place 1 tablet (0.4 mg total) under the tongue every 5 (five) minutes as needed for chest pain.  Marland Kitchen omeprazole (PRILOSEC) 20 MG capsule TAKE ONE CAPSULE BY MOUTH DAILY.  . simvastatin (ZOCOR) 80 MG tablet Take 0.5 tablets (40 mg total) by mouth at bedtime.  . VENTOLIN HFA 108 (90 Base) MCG/ACT inhaler INHALE 2 PUFFS BY MOUTH EVERY 4 TO 6 HOURS AS NEEDED FOR WHEEZING.   No facility-administered encounter medications on file as of 06/06/2016.     Allergies  Allergen Reactions  . Neomycin Hives    Immunization History  Administered Date(s) Administered  . H1N1 08/06/2008  . Influenza Split 06/24/2013  . Influenza,inj,Quad PF,36+ Mos 06/15/2014, 05/31/2015, 05/09/2016  . Influenza-Unspecified 05/27/2012  . Pneumococcal Conjugate-13 08/18/2014  . Pneumococcal Polysaccharide-23 03/27/2012  . Zoster 07/27/2008    Current Medications, Allergies, Past Medical History, Past Surgical History, Family History, and Social History were reviewed in [employer] record.   Review of Systems              All symptoms NEG except where BOLDED >>  Constitutional:  F/C/S, fatigue, anorexia, unexpected weight change. HEENT:  HA, visual changes, hearing loss, earache, nasal symptoms, sore throat, mouth sores, hoarseness. Resp:  cough, sputum, hemoptysis; SOB, tightness, wheezing. Cardio:  CP, palpit, DOE, orthopnea, edema. GI:  N/V/D/C, blood in stool; reflux, abd pain, distention, gas. GU:  dysuria, freq, urgency, hematuria,  flank pain, voiding difficulty. MS:  joint pain, swelling, tenderness, decr ROM; neck pain, back pain, etc. Neuro:  HA, tremors, seizures, dizziness, syncope, weakness, numbness, gait abn. Skin:  suspicious lesions or skin rash. Heme:  adenopathy, bruising, bleeding. Psyche:  confusion, agitation, sleep disturbance, hallucinations, anxiety, depression suicidal.   Objective:   Physical Exam        Vital Signs:  Reviewed...   General:  WD, WN, 75 y/o WM in NAD; alert & oriented; pleasant & cooperative... HEENT:  [place]/AT; Conjunctiva- pink, Sclera- nonicteric, EOM-wnl, PERRLA, EACs-clear, TMs-wnl; NOSE-clear; THROAT-clear & wnl.  Neck:  Supple w/ fair ROM; no JVD; normal carotid impulses w/ faint bruits; no thyromegaly or nodules palpated; no lymphadenopathy.  Chest:  decr BS right base w/ few rhonchi, no w/r/signs of consolidation Heart:  Regular Rhythm; norm S1 & S2 w/ Gr1/6 SEM without rubs or gallops detected. Abdomen:  Soft & nontender- no guarding or rebound; normal bowel sounds; no  organomegaly or masses palpated. Ext:  Sl decr ROM; without deformities +arthritic changes; no varicose veins, +venous insuffic, tr edema;  Pulses decr w/o bruits. Neuro:  CNs II-XII intact; motor testing normal; sensory testing normal; gait normal & balance OK. Derm:  No lesions noted; no rash etc. Lymph:  No cervical, supraclavicular, axillary, or inguinal adenopathy palpated.   Assessment:      IMP>>      Non-small cell lung cancer, Stage IIIA, diagnosed 10/2015 & treated by DrMohamed & DrManning w/ Chemoradiation (carboplatin & paclitaxel)     Abnormal CXR w/ RUL mass, s/p treatment as above, & subseq RUL opac (?infection vs radiation fibrosis changes) w/ assoc right pleural effusion>      COPD, former smoker (quit 1996 w/ ~40+ pack-yr hx)>  On BREO one inhalation daily, VentolinHFA rescue inhaler as needed    CAD, s/pCABG 1996, RBBB, ischemic cardiomyopathy w/ EF=30-35% 01/2016, acute on chronic  systolic & diastolic CHF, R>>L pleural effusion after salt tabs for hyponatremia>  On ASA81, Plavix75, Metop50-1.5TabsBid, Lasix40.     ASPVD- s/p AAA stent graft 11/2012, mod bilat carotid occlusive dis (asymptomatic) w/ CDopplers showing ~50-60% bilat ICAstenoses>  Stable & seen by [facility] 01/2016- plans yearly f/u CT Abd and CDopplers    MEDICAL issues>  HBP, HL, GERD/ Divertic/ Polyps, CKD-stage3, Anemia & thrombocytopenia>  On Simva80-1/2Qd, Prilosec20/d, Fe/ MVI/ etc...  PLAN>>  02/29/16>   Problem list as above w/ signif cardiac, pulmonary, & post chemoradiation changes;  From the pulm standpoint- he feels the BREO is helping & hasn't needed the rescue inhaler very often, continue same;  He is on a long PRED taper per DrManning for poss radiation fibrosis RUL, Sed rate is 53, continue this taper & careful w/ sweets/ sugar etc;  From the cardiac standpoint- he has signif underlying dis w/ Lmain stent, s/p CABG 20 yrs ago, prob ischemic cardiomyopathy w/ worsening 2DEcho recently w/ 30-35% EF + HK&AK seen, BNP=1053, Cards plans ischemic work up when able, in the meanwhile he has improved w/ diuresis but Chems w/ mild RI & HCO3=36... REC- add DIAMOX-ER 500 one tab daily at 4pm, continue his Lasix40 Qam, increase water intake, watch weights... We plan ROV 70monthrecheck... 04/04/16>   Discussed w/ Keyan-- continue the Levaquin til gone, use Tylenol for fever or pain; continue the Breo daily & Ventolin-HFA as needed; wean off the Diamox at this time, continue Lasix40, no salt, etc; he needs to gradually increase his exercise/ mobility; we will plan ROV w/ labs in 274mo     Plan:     Patient's Medications  New Prescriptions   No medications on file  Previous Medications   ACETAMINOPHEN (TYLENOL) 500 MG TABLET    Take 500 mg by mouth every 6 (six) hours as needed for mild pain.   ASPIRIN EC 81 MG TABLET    Take 81 mg by mouth daily.   BUDESONIDE-FORMOTEROL (SYMBICORT) 160-4.5 MCG/ACT INHALER    Inhale 2  puffs into the lungs 2 (two) times daily.   CLOPIDOGREL (PLAVIX) 75 MG TABLET    Take 1 tablet (75 mg total) by mouth daily. Reported on 12/02/2015   FERROUS SULFATE (IRON) 28 MG TABS    Take 28 mg by mouth daily.   FUROSEMIDE (LASIX) 40 MG TABLET    Take 1 tablet (40 mg total) by mouth daily.   METOPROLOL TARTRATE 75 MG TABS    Take 1 tablet by mouth 2 (two) times daily.   MULTIPLE VITAMINS-MINERALS (CENTRUM  SILVER ADULT 50+) TABS    Take 1 tablet by mouth daily.    NITROGLYCERIN (NITROSTAT) 0.4 MG SL TABLET    Place 1 tablet (0.4 mg total) under the tongue every 5 (five) minutes as needed for chest pain.   OMEPRAZOLE (PRILOSEC) 20 MG CAPSULE    TAKE ONE CAPSULE BY MOUTH DAILY.   SIMVASTATIN (ZOCOR) 80 MG TABLET    Take 0.5 tablets (40 mg total) by mouth at bedtime.   VENTOLIN HFA 108 (90 BASE) MCG/ACT INHALER    INHALE 2 PUFFS BY MOUTH EVERY 4 TO 6 HOURS AS NEEDED FOR WHEEZING.  Modified Medications   No medications on file  Discontinued Medications   No medications on file

## 2016-06-07 ENCOUNTER — Other Ambulatory Visit: Payer: Self-pay | Admitting: *Deleted

## 2016-06-07 ENCOUNTER — Other Ambulatory Visit: Payer: Self-pay | Admitting: Pulmonary Disease

## 2016-06-07 DIAGNOSIS — C3411 Malignant neoplasm of upper lobe, right bronchus or lung: Secondary | ICD-10-CM

## 2016-06-15 ENCOUNTER — Telehealth: Payer: Self-pay | Admitting: Pulmonary Disease

## 2016-06-15 ENCOUNTER — Ambulatory Visit (HOSPITAL_COMMUNITY)
Admission: RE | Admit: 2016-06-15 | Discharge: 2016-06-15 | Disposition: A | Discharge status: Home / Self Care | Payer: Medicare Other | Source: Ambulatory Visit | Attending: Pulmonary Disease | Admitting: Pulmonary Disease

## 2016-06-15 ENCOUNTER — Other Ambulatory Visit: Payer: Self-pay | Admitting: Pulmonary Disease

## 2016-06-15 DIAGNOSIS — C3411 Malignant neoplasm of upper lobe, right bronchus or lung: Secondary | ICD-10-CM | POA: Diagnosis not present

## 2016-06-15 DIAGNOSIS — J90 Pleural effusion, not elsewhere classified: Secondary | ICD-10-CM

## 2016-06-15 LAB — GLUCOSE, CAPILLARY: Glucose-Capillary: 94 mg/dL (ref 65–99)

## 2016-06-15 MED ORDER — FLUDEOXYGLUCOSE F - 18 (FDG) INJECTION
8.7300 | Freq: Once | INTRAVENOUS | Status: AC | PRN
  Administered 2016-06-15: 8.73 via INTRAVENOUS

## 2016-06-15 NOTE — Telephone Encounter (Signed)
Will forward this information to SN to make him aware.

## 2016-06-15 NOTE — Telephone Encounter (Signed)
Per SN---  Schedule thoracentesis with radiology to drain rt effusion The end of next week---not on Monday or Tuesday please Send for cytology and glucose, LDH, cell count and diff, total protein.

## 2016-06-20 ENCOUNTER — Encounter (HOSPITAL_COMMUNITY): Payer: Medicare Other

## 2016-06-22 ENCOUNTER — Ambulatory Visit (HOSPITAL_COMMUNITY)
Admission: RE | Admit: 2016-06-22 | Discharge: 2016-06-22 | Disposition: A | Discharge status: Home / Self Care | Payer: Medicare Other | Source: Ambulatory Visit | Attending: Pulmonary Disease | Admitting: Pulmonary Disease

## 2016-06-22 ENCOUNTER — Ambulatory Visit (HOSPITAL_COMMUNITY)
Admission: RE | Admit: 2016-06-22 | Discharge: 2016-06-22 | Disposition: A | Discharge status: Home / Self Care | Payer: Medicare Other | Source: Ambulatory Visit | Attending: Radiology | Admitting: Radiology

## 2016-06-22 DIAGNOSIS — J90 Pleural effusion, not elsewhere classified: Secondary | ICD-10-CM | POA: Diagnosis not present

## 2016-06-22 DIAGNOSIS — Z9889 Other specified postprocedural states: Secondary | ICD-10-CM

## 2016-06-22 DIAGNOSIS — R846 Abnormal cytological findings in specimens from respiratory organs and thorax: Secondary | ICD-10-CM | POA: Diagnosis not present

## 2016-06-22 LAB — GLUCOSE, SEROUS FLUID: Glucose, Fluid: 101 mg/dL

## 2016-06-22 LAB — PROTEIN, BODY FLUID: TOTAL PROTEIN, FLUID: 4.1 g/dL

## 2016-06-22 LAB — BODY FLUID CELL COUNT WITH DIFFERENTIAL
LYMPHS FL: 58 %
Monocyte-Macrophage-Serous Fluid: 40 % — ABNORMAL LOW (ref 50–90)
NEUTROPHIL FLUID: 2 % (ref 0–25)
Total Nucleated Cell Count, Fluid: 923 cu mm (ref 0–1000)

## 2016-06-22 LAB — LACTATE DEHYDROGENASE, PLEURAL OR PERITONEAL FLUID: LD, Fluid: 119 U/L — ABNORMAL HIGH (ref 3–23)

## 2016-06-22 NOTE — Procedures (Addendum)
Ultrasound-guided diagnostic and therapeutic right thoracentesis performed yielding 1.2 liters of slightly hazy, yellow fluid. No immediate complications. Follow-up chest x-ray pending. The fluid was sent to the lab for preordered studies.

## 2016-06-27 ENCOUNTER — Telehealth: Payer: Self-pay | Admitting: Pulmonary Disease

## 2016-06-27 NOTE — Telephone Encounter (Signed)
SN pt was calling you back.  He stated that your message got cut off and he will be at another appt at 4 today.

## 2016-06-28 ENCOUNTER — Other Ambulatory Visit: Payer: Self-pay | Admitting: Pulmonary Disease

## 2016-06-28 DIAGNOSIS — J90 Pleural effusion, not elsewhere classified: Secondary | ICD-10-CM

## 2016-07-02 NOTE — Progress Notes (Signed)
Cardiology Office Note    Date:  07/04/2016   ID:  Paul Sandoval, DOB 1941-06-03, MRN [id number]  PCP:  [name], Paul Sandoval  Cardiologist:  Dr. Johnsie Cancel  CC: post hospital follow up  History of Present Illness:   16M with CAD (s/p CABG 1996, [place] 02/2014 s/p DES to dLM & staged PCI of SVG-diag), carotid disease and aortic stent graft followed by Dr. 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  In 02/2014 he underwent planned diagnostic left heart catheterization, revealing severe three-vessel coronary artery disease and total occlusion of the RCA, total occlusion of the LAD, severe ulcerative stenosis of the distal left main, and severe diffuse stenosis of the OM left circumflex branches. There was continued patency of the saphenous vein graft to his PDA LIMA to LAD and patent but severely diseased saphenous vein graft to the diagonal. The patient underwent PCI of the distal left main with drug-eluting stent. A staged PCI of the saphenous vein graft to the diagonal 02/2014. Last seen by me August.  No anginal pain.  He was diagnosed with a lung cancer in 10/2015 and completed radiation + carboplatin/paclitaxel therapy x 4 in early June. He was admitted 02/05/16-02/08/16 for community-acquired pneumonia. He also had acute on chronic kidney disease and hyponatremia with sodium 125. His hydrochlorothiazide was stopped and started on sodium tablets. His Enalapril 10 mg was discontinued at that time.  Since then he continued to have cough and intermittent low-grade fever. He then noted progressive worsening of lower extremity edema and orthopnea. He was seen in [facility] 02/21/16 and complaining of increased shortness of breath, fatigue and cough with temperature of 100.1. 7 admitted directly to [facility] for further evaluation where workup revealed worsening pneumonia on RUL. CT showed pneumonia and R pleural effusion s/p t thoracocentesis with 1150 ml of clear yellow pleural fluid was obtained. His CT scan findings were reviewed by the radiation oncologist Dr. Tammi Klippel who recommended 30 day prednisone taper as his CT scan findings could be related to radiation-induced lung injury.They then felt like he probably did not have PNA and held Abx and started on prednisone as recommended.   Due to concern for acute heart failure BNP was obtained which showed elevation >1000. Echo done during this admission showed reduction in his left ventricular function to 40-98%, grade 2 diastolic dysfunction, elevated ventricular end-diastolic filling pressure (EF of 45-50% 03/17/14). He was diuresed and outpatient myoview was recommended. His troponin was noted to be elevated but c/w demand ischemia in the setting of acute CHF. His home HCTZ was discontinued and he was started on Lasix '40mg'$  daily.   He saw Dr. Lenna Gilford with PCCM  and repeat CXR on 02/29/16 showed "stable right upper lobe postradiation changes and/or infiltrate. Tiny bilateral pleural effusions cannot be excluded." Labs showed improving H/H to 10.9/33.5. BNP was still elevated with elevated CO2 and he was started on Diamox. He was continued on lasix '40mg'$  daily.   Today he presents to clinic for follow up. No dizziness or syncope. He has been feeling much better. No LE edema, orthopnea or PND. No chest pain. No dizziness or syncope. No blood in his stool or urine. Just so much happier to be out of the hospital. He thinks this was all caused by the  sodium tablets he was given during his first admission.   F/U myovue done 03/06/16 reviewed by Dr Henrene Dodge medical Rx   Nuclear stress EF: 30%. The LV function is markedly depressed  There was no ST segment deviation noted during stress.  Defect 1: There is a large defect of severe severity present in  the basal anteroseptal, mid anterior, mid anteroseptal, apical anterior and apex location.  Findings consistent with prior anterior apical myocardial infarction. There is no evidence of reversible ischemia  This is a high risk study.  Cath not planned due to lack of symptoms and advanced lung cancer No chest pain    Recurrent right pleural effusions last thoracentesis 06/22/16  No cancer cells  Walking 2 miles / day again Moving lawn not playing golf yet. Sees oncology again in December  Past Medical History:  Diagnosis Date  . AAA (abdominal aortic aneurysm) ([place]) 2010   4.4 cm 08/2008;4.44 in 7/10 and 4.65 in 08/2009; 4.8 by CT in 11/2009; 4.3 by ultrasound in 08/2010  . Arteriosclerotic cardiovascular disease (ASCVD) 1996   CABG-1996  . Arthritis    "fingers" (03/18/2014)  . CAD (coronary artery disease)    03/18/14:  PCI with DES to distal left main. 7/29: DES to the SVG to Diag  . Cancer ([place])    Upper right lobe lung cancer  . Cardiomyopathy, ischemic    Echo 03/17/14: EF 45-50%  . Chronic bronchitis ([place])   . Chronic rhinitis   . Colonic polyp 2002   polypectomy in 2002  . COPD (chronic obstructive pulmonary disease) ([place])   . Diverticulosis   . ED (erectile dysfunction)   . Encounter for antineoplastic chemotherapy 12/19/2015  . GERD (gastroesophageal reflux disease)   . Hyperlipidemia   . Hypertension   . IFG (impaired fasting glucose)   . Myocardial infarction    "told h/o silent MI sometime before 1996"  . Pneumonia ~ 2001; ~ 2005  . Right bundle branch block   . Tobacco abuse, in remission    40 pack year total consumption; discontinued in 1996    Past Surgical History:  Procedure Laterality Date  . ABDOMINAL AORTIC ANEURYSM REPAIR  11/2012  . ABDOMINAL AORTIC ENDOVASCULAR STENT GRAFT N/A 12/11/2012   Procedure: ABDOMINAL AORTIC ENDOVASCULAR STENT GRAFT;  Surgeon: Mal Misty, Paul Sandoval;  Location: [facility];  Service: Vascular;  Laterality: N/A;  Ultrasound guided; [place]   . CARDIAC CATHETERIZATION  01/08/1995  . COLONOSCOPY  2002   polypectomy-patient denies  . CORONARY ANGIOPLASTY WITH STENT PLACEMENT  03/18/2014   "1"  . CORONARY ANGIOPLASTY WITH STENT PLACEMENT  03/24/2014   "1"  . CORONARY ARTERY BYPASS GRAFT  01/09/1995   "CABG X3"  . JOINT REPLACEMENT    . LAPAROSCOPIC CHOLECYSTECTOMY  12/2009  . LEFT AND RIGHT HEART CATHETERIZATION WITH CORONARY/GRAFT ANGIOGRAM N/A 03/18/2014   Procedure: LEFT AND RIGHT HEART CATHETERIZATION WITH Beatrix Fetters;  Surgeon: Blane Ohara, Paul Sandoval;  Location: [facility] CATH LAB;  Service: Cardiovascular;  Laterality: N/A;  . PERCUTANEOUS CORONARY STENT INTERVENTION (PCI-S)  03/18/2014   Procedure: PERCUTANEOUS CORONARY STENT INTERVENTION (PCI-S);  Surgeon: Blane Ohara, Paul Sandoval;  Location: [facility] CATH LAB;  Service: Cardiovascular;;  . PERCUTANEOUS CORONARY STENT INTERVENTION (PCI-S) N/A 03/24/2014   Procedure: PERCUTANEOUS CORONARY STENT INTERVENTION (PCI-S);  Surgeon: Blane Ohara, Paul Sandoval;  Location: [facility] CATH LAB;  Service: Cardiovascular;  Laterality: N/A;  . TOTAL HIP ARTHROPLASTY Left 01/21/2013   Procedure: TOTAL HIP ARTHROPLASTY ANTERIOR APPROACH;  Surgeon: Mauri Pole,  Paul Sandoval;  Location: [facility];  Service: Orthopedics;  Laterality: Left;  Marland Kitchen VIDEO BRONCHOSCOPY N/A 11/17/2015   Procedure: VIDEO BRONCHOSCOPY WITH FLUORO;  Surgeon: Rigoberto Noel, Paul Sandoval;  Location: [facility];  Service: Cardiopulmonary;  Laterality: N/A;    Current Medications: Outpatient Medications Prior to Visit  Medication Sig Dispense Refill  . acetaminophen (TYLENOL) 500 MG tablet Take 500 mg by mouth every 6 (six) hours as needed for mild pain.    Marland Kitchen aspirin EC 81 MG tablet Take 81 mg by mouth daily.    . budesonide-formoterol (SYMBICORT) 160-4.5 MCG/ACT inhaler Inhale 2 puffs into the lungs 2 (two) times daily.    . clopidogrel (PLAVIX) 75 MG tablet Take 1 tablet (75 mg total) by mouth daily. Reported on 12/02/2015 90 tablet 3  . Ferrous Sulfate (IRON) 28 MG  TABS Take 28 mg by mouth daily.    . furosemide (LASIX) 40 MG tablet Take 1 tablet (40 mg total) by mouth daily. 30 tablet 11  . Metoprolol Tartrate 75 MG TABS Take 1 tablet by mouth 2 (two) times daily. 180 tablet 1  . Multiple Vitamins-Minerals (CENTRUM SILVER ADULT 50+) TABS Take 1 tablet by mouth daily.     . nitroGLYCERIN (NITROSTAT) 0.4 MG SL tablet Place 1 tablet (0.4 mg total) under the tongue every 5 (five) minutes as needed for chest pain. 25 tablet 4  . omeprazole (PRILOSEC) 20 MG capsule TAKE ONE CAPSULE BY MOUTH DAILY. 90 capsule 0  . simvastatin (ZOCOR) 80 MG tablet Take 0.5 tablets (40 mg total) by mouth at bedtime. 15 tablet 5  . VENTOLIN HFA 108 (90 Base) MCG/ACT inhaler INHALE 2 PUFFS BY MOUTH EVERY 4 TO 6 HOURS AS NEEDED FOR WHEEZING. 18 g 5   No facility-administered medications prior to visit.      Allergies:   Neomycin   Social History   Social History  . Marital status: Married    Spouse name: N/A  . Number of children: 1  . Years of education: N/A   Occupational History  . Retired     [employer]   Social History Main Topics  . Smoking status: Former Smoker    Packs/day: 1.50    Years: 30.00    Types: Cigarettes    Start date: 12/01/1956    Quit date: 01/08/1995  . Smokeless tobacco: Never Used  . Alcohol use 0.0 oz/week     Comment: 03/18/2014 "no alacohol since 1996"  . Drug use: No  . Sexual activity: No   Other Topics Concern  . None   Social History Narrative  . None     Family History:  The patient's family history includes Arthritis in his mother and sister; Cancer in his father; Heart disease in his father; Hypertension in his brother; Parkinsonism in his mother.     ROS:   Please see the history of present illness.    ROS All other systems reviewed and are negative.   PHYSICAL EXAM:   VS:  BP 116/64   Pulse 77   Ht '5\' 10"'$  (1.778 m)   Wt 83.9 kg (185 lb)   SpO2 97%   BMI 26.54 kg/m    GEN: Well nourished, well  developed, in no acute distress  HEENT: normal  Neck: no JVD, carotid bruits, or masses Cardiac: RRR; no murmurs, rubs, or gallops,no edema  Respiratory:  clear to auscultation bilaterally, normal work of breathing GI: soft, nontender, nondistended, + BS MS: no deformity or atrophy  Skin: warm  and dry, no rash Neuro:  Alert and Oriented x 3, Strength and sensation are intact Psych: euthymic mood, full affect  Wt Readings from Last 3 Encounters:  07/04/16 83.9 kg (185 lb)  06/06/16 83.1 kg (183 lb 2 oz)  05/16/16 81.3 kg (179 lb 3.2 oz)      Studies/Labs Reviewed:   EKG:  EKG is NOT ordered today.     Recent Labs: 02/21/2016: B Natriuretic Peptide 942.6 02/23/2016: Magnesium 1.9 02/24/2016: TSH 0.918 02/29/2016: Pro B Natriuretic peptide (BNP) 1,053.0 05/10/2016: ALT 12; BUN 14.5; Creatinine 1.5; HGB 11.8; Platelets 163; Potassium 4.2; Sodium 141   Lipid Panel    Component Value Date/Time   CHOL 158 05/10/2016 1105   TRIG 157 (H) 05/10/2016 1105   HDL 37 (L) 05/10/2016 1105   CHOLHDL 4.3 05/10/2016 1105   CHOLHDL 4.4 06/30/2014 0810   VLDL 45 (H) 06/30/2014 0810   LDLCALC 90 05/10/2016 1105    Additional studies/ records that were reviewed today include:  2D ECHO: 02/22/2016 LV EF: 30% - 35% Study Conclusions - Left ventricle: There is diffuse hypokinesis and akinesis of the mid anteroseptal, aterior and anterolateral walls and apical anterior and septal walls. The cavity size was normal. There was mild concentric hypertrophy. Systolic function was moderately to severely reduced. The estimated ejection fraction was in the range of 30% to 35%. Features are consistent with a pseudonormal left ventricular filling pattern, with concomitant abnormal relaxation and increased filling pressure (grade 2 diastolic dysfunction). Doppler parameters are consistent with elevated ventricular end-diastolic filling pressure. - Aortic valve: Trileaflet; normal thickness leaflets. There was no  regurgitation. - Aortic root: The aortic root was normal in size. - Mitral valve: Structurally normal valve. There was mild  regurgitation. - Left atrium: The atrium was mildly dilated. - Right ventricle: The cavity size was normal. Wall thickness was normal. Systolic function was normal. - Right atrium: The atrium was normal in size. - Tricuspid valve: There was mild regurgitation. - Pulmonic valve: There was trivial regurgitation. - Pulmonary arteries: Systolic pressure was within the normal range. - Inferior vena cava: The vessel was normal in size. - Pericardium, extracardiac: There was no pericardial effusion.    ASSESSMENT & PLAN:   Chronic combined CHF with cardiomyopathy - 2D ECHO on most recent admission with decrease in left ventricular function to 53-29%, grade 2 diastolic dysfunction, elevated ventricular end-diastolic filling pressure (EF of 45-50% 03/17/14).  This may be related to chemotherapy, however, there are regional wall motion abnormalities. Myovue  7/11 scar no ischemia. Given unclear future of prognosis  With lung cancer will not recommend cath or AICD evaluation at this time   CAD - continue ASA, Plavix, BB, statin. Lexiscan myoview non ischemic     HTN - controlled.  CKD stage III - baseline Cr appears 1.3-1.6. 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 No transfusion on most recent admission.   Lab Results  Component Value Date   HCT 36.4 (L) 05/10/2016    Right Lung NSCLC Stage IIIA (T2b, N2, M0) - undergoing chemotherapy. Followed by Dr. Julien Nordmann in the [place]. His CT scan findings were reviewed by the radiation oncologist Dr. Tammi Klippel done with antibiotics and prednisone taper  F/u  with pulmonary , oncology and XRT  Medication  Adjustments/Labs and Tests Ordered: Current medicines are reviewed at length with the patient today.  Concerns regarding medicines are outlined above.  Medication changes, Labs and Tests ordered today are listed in the Patient Instructions below. There are no Patient Instructions on file for this visit.   Signed, Jenkins Rouge, Paul Sandoval  07/04/2016 8:43 AM    [facility], [place], [place]  [address] Phone: [phone]; Fax: [phone]

## 2016-07-04 ENCOUNTER — Ambulatory Visit (INDEPENDENT_AMBULATORY_CARE_PROVIDER_SITE_OTHER): Payer: Medicare Other | Admitting: Cardiovascular Disease

## 2016-07-04 ENCOUNTER — Encounter: Payer: Self-pay | Admitting: Cardiovascular Disease

## 2016-07-04 VITALS — BP 116/64 | HR 77 | Ht 70.0 in | Wt 185.0 lb

## 2016-07-04 DIAGNOSIS — I2510 Atherosclerotic heart disease of native coronary artery without angina pectoris: Secondary | ICD-10-CM | POA: Diagnosis not present

## 2016-07-04 NOTE — Patient Instructions (Signed)

## 2016-08-06 ENCOUNTER — Ambulatory Visit (INDEPENDENT_AMBULATORY_CARE_PROVIDER_SITE_OTHER): Payer: Medicare Other | Admitting: Pulmonary Disease

## 2016-08-06 ENCOUNTER — Encounter: Payer: Self-pay | Admitting: Pulmonary Disease

## 2016-08-06 VITALS — BP 120/68 | HR 71 | Temp 97.0°F | Ht 70.0 in | Wt 187.0 lb

## 2016-08-06 DIAGNOSIS — I714 Abdominal aortic aneurysm, without rupture, unspecified: Secondary | ICD-10-CM

## 2016-08-06 DIAGNOSIS — I2510 Atherosclerotic heart disease of native coronary artery without angina pectoris: Secondary | ICD-10-CM | POA: Diagnosis not present

## 2016-08-06 DIAGNOSIS — C3411 Malignant neoplasm of upper lobe, right bronchus or lung: Secondary | ICD-10-CM

## 2016-08-06 DIAGNOSIS — I5042 Chronic combined systolic (congestive) and diastolic (congestive) heart failure: Secondary | ICD-10-CM | POA: Diagnosis not present

## 2016-08-06 DIAGNOSIS — N183 Chronic kidney disease, stage 3 unspecified: Secondary | ICD-10-CM

## 2016-08-06 DIAGNOSIS — I255 Ischemic cardiomyopathy: Secondary | ICD-10-CM | POA: Diagnosis not present

## 2016-08-06 DIAGNOSIS — J449 Chronic obstructive pulmonary disease, unspecified: Secondary | ICD-10-CM

## 2016-08-06 DIAGNOSIS — I679 Cerebrovascular disease, unspecified: Secondary | ICD-10-CM | POA: Diagnosis not present

## 2016-08-06 NOTE — Patient Instructions (Signed)
Today we updated your med list in our EPIC system...    Continue your current medications the same...  Continue your diet & exercise program...  We will check on the CT Chest planned by DrMohamed next week...  Call for any questions...  Let's plan a follow up visit in 29mo sooner if needed for problems..Marland KitchenMarland Kitchen

## 2016-08-06 NOTE — Progress Notes (Signed)
Subjective:     Patient ID: Paul Sandoval, male   DOB: 03-Jan-1941, 75 y.o.   MRN: [id number]  HPI  ~  February 29, 2016:  Paul Sandoval w/ SN>  Paul Sandoval is a 75 y/o WF w/ mult medical problems as noted below who has seen DrAlva & DrMannam previously;  I have reviewed his extensive epic records and recent [place] x2 in June2017 and formulated the following PROBLEM LIST>>  His PCP is DrLuking in [place]... He reminded me that I cared for his dad in 41...    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    ~  [place]x2 in 01/2016>  Fever, cough, increased RUL opac (PCT<0.10, he received 10d Levaquin) & right>left effusion w/ thoracentesis 02/22/16 removing 1200cc clear yellow fluid, prob transudate, & BNP was >900; meds adjusted & he was diuresed w/ improvement but 2DEcho revealed worsening CHF w/ EF ~30-35% + HK & AK => he has f/u pending w/ Cards...    ~   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    ASPVD- s/p AAA stent graft 11/2012, mod bilat carotid occlusive dis (asymptomatic) w/ CDopplers showing ~50-60% bilat ICAstenoses>  [facility] plans yearly f/u CT Abd and CDopplers    MEDICAL issues>  HBP, HL, GERD/ Divertic/ Polyps, CKD-stage3, Anemia & thrombocytopenia>  On Simva80-1/2Qd, Prilosec20/d, Fe/ MVI/ etc... Since he was disch 02/25/16>  He reports feeling better, breathing better, still sl SOB w/ activ but not requiring O2, mild cough w/ beige sput, no hemoptysis, denies CP=> he is due for f/u CXR & blood work post hosp... EXAM shows Afeb, VSS, O2sat=95% on RA after walking back; Wt=180#;  HEENT- neg, mallampati2;  Chest- sl decr BS right base, few rhonchi, w/o w/r/consolidation;  Heart- RR Gr1/6SEM w/o r/g;  Abd- soft, neg;  Ext- neg w/o c/c/e...  PFT 02/01/16>  FVC=1.96 (45%), FEV1=1.25 (40%), %1sec=64, mid-flows were reduced at 28% predicted; post bronchodil there was a 31% improvement in FEV1 to 1.64;  TLC=5.96 (83%, RV=3.89 (  149%), RV/TLC=65%;  DLCO=58% pred.  This is c/w moderate to severe airflow obstruction, GOLD Stage 3 COPD w/ a signif asthmatic component, air trapping, and decr diffusion capacity...  2DEcho 02/22/16>  Mild conc LVH w/ decr LVF & EF=30-35% w/ diffuse HK & AK in several walls (see report), Gr2 DD, mild MR otherw norm valves, mild LAdil (13m), norm RV function & PA pressures  LABS 01/2016 Hosp>  Chems- ok x Cr=1.3-1.4, BS=120-140, Alb=2.8;  CBC- anemic w/ Hg=8-9 range, Plat~130K range;  TSH=0.92  CT Chest 02/21/16>  Norm heart size, extensive coronary calcif, s/p CABG, decr size of the pathological  right paratracheal LN, decr size of RUL mass (now ~2cm & prev ~4cm), new confluent airsp opac w/ air bronchograms in RUL w/ large right effusion; s/p GB, +HH, Abd Ao stent graft, DJD & DISH w/o metastatic lesions  CT Angio Chest 02/24/16>  NEG for PE, s/pCABG & stent in L[place], norm heart size, no pericard fluid, small right pleural effusion, architectural distortion & interstitial opacities in RUL c/w XRT changes, RUL nodule measures ~2cm, no adenopathy reported...   CXR 02/29/16>  Stable heart size, tortuous Ao, s/p CABG, sl improvement in interstitial opac in RUL area & posteromedial RUL nodule  LABS 02/29/16>  Chems- ok x HCO3=36, BS=120, BUN/Cr=30/1.40;  CBC- Hg=10.9, Plat=249K, WBC=18.2 (on Pred);  BNP=1053;  Sed=53 IMP/PLAN>>  Problem list as above w/ signif cardiac, pulmonary, & post chemoradiation changes;  From the pulm standpoint- he feels the BREO is helping & hasn't needed the rescue inhaler very often, continue same;  He is on a long PRED taper per DrManning for poss radiation fibrosis RUL, Sed rate is 53, continue this taper & careful w/ sweets/ sugar etc;  From the cardiac standpoint- he has signif underlying dis w/ Lmain stent, s/p CABG 20 yrs ago, prob ischemic cardiomyopathy w/ worsening 2DEcho recently w/ 30-35% EF + HK&AK seen, BNP=1053, Cards plans ischemic work up when able, in the meanwhile he has improved w/ diuresis but Chems w/ mild RI & HCO3=36... REC- add DIAMOX-ER 500 one tab daily at 4pm, continue his Lasix40 Qam, increase water intake, watch weights... We plan ROV [id number]...   ~  April 04, 2016:  3m75moV w/ SN>> Paul Sandoval the [facility] symptom [facility] clinic 8/4 w/ intermittent cough, wheezing, fever; they did a f/u CXR interpreted as showing a right base opac suspicious for acute pneumonia but this is a soft finding on my review of the XRay, and the prev RUL reticulonod opac is improved (radiation fibrosis improved w/ Pred taper); placed on Levaquin Rx;  He denies  sput production, hemoptysis, CP, or SOB but he is fairly sedentary.    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    ~  [place]>  Fever, cough, increased RUL opac (PCT<0.10, he received 10d Levaquin) & right>left effusion w/ thoracentesis 02/22/16 removing 1200cc clear yellow fluid, prob transudate, & BNP was >900; meds adjusted & he was diuresed w/ improvement but 2DEcho revealed worsening CHF w/ EF ~30-35% + HK & AK => he has f/u pending w/ Cards...    ~  [facility]  He had right pleural effusion tapped 02/22/16=> Prob TRANSUDATE w/ TProt<3.0, LDH=95, Cytology=NEG (reactive mesothelial cells only);  Cults=> no growth    ~  03/2016 developed cough/ wheezing/ low grade fever=> CXR w/ improved RUL, ?incr markings R base, he was off the Pred, given more Levaquin...     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    ~  He saw CARDS PA 03/01/16> no change in meds, Lexiscan Myoview 03/06/16 showed hi risk study w/ EF=30%, no ST segm changes, c/w antero-apical MI & no evid reversible ischemia...    ASPVD- s/p AAA stent graft 11/2012, mod bilat carotid occlusive dis (asymptomatic) w/ CDopplers showing ~50-60% bilat ICAstenoses>  [facility]- plans yearly f/u CT Abd and CDopplers    MEDICAL issues>  HBP, HL, GERD/ Divertic/ Polyps, CKD-stage3, Anemia & thrombocytopenia>  On Simva80-1/2Qd, Prilosec20/d, Fe/ MVI/ etc... EXAM shows Afeb, VSS, O2sat=99% on RA; Wt=178#;  HEENT- neg, mallampati2;  Chest- sl decr BS right base, few rhonchi, w/o w/r/consolidation;  Heart- RR Gr1/6SEM w/o r/g;  Abd- soft, neg;  Ext- neg w/o c/c/e...  Lexiscan Myoview 03/06/16 showed hi risk study w/ EF=30%, no ST segm changes, c/w antero-apical MI & no evid reversible ischemia...  CXR 03/30/16>  incr patchy opac at the right base, no effusion, RUL reticulonodular opac in RUL has regressed; norm heart size s/p CABG, abd Ao endograft part vis  LABS 03/28/16>  Chems- ok w/ K=4.0, HCO3=23, Cr=1.6, BS=145;  CBC- ok w/ Hg=11.4, wbc=6.1, plat=85K;   IMP/PLAN>>  Discussed w/ Mace-- continue the Levaquin til gone, use Tylenol for fever or pain; continue the Breo daily & Ventolin-HFA as needed; wean off the Diamox at this time, continue Lasix40, no salt, etc; he needs to gradually increase his exercise/ mobility; we will plan ROV w/ labs in 286mo.   ~  June 06, 2016:  17m49moV w/ SN>  BilHarveerports that he is feeling better, gaining strength, walking 17mi90m mowing yard, and resting well at night;  He notes min hacking cough, no sput- no discoloration or blood, SOB is diminished & he denies CP, no f/c/s... He saw DrMohamed 05/16/16- pt remains on observation (s/p chemoradiation, 5 cycles w/ partial  response); repeat CT Chest 05/10/16 showed more confluent airsp dis in RUL w/ vol loss, abn soft tissue attenuation in right hilum has progressed (?felt to be treatment related), stable subcarinal LN ~11mm51mderate right effusion... DrMohamed felt this scan showed no concerning features of dis progression & they opted for  continued observ w/ f/u 13mo.. 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    ~  [place] in 01/2016>  Fever, cough, increased RUL opac (PCT<0.10, he received 10d Levaquin) & right>left effusion w/ thoracentesis 02/22/16 removing 1200cc clear yellow fluid, prob transudate, & BNP was >900; meds adjusted & he was diuresed w/ improvement but 2DEcho revealed worsening CHF w/ EF ~30-35% + HK & AK => he has f/u pending w/ Cards...    ~  [facility]  He had right pleural effusion tapped 02/22/16=> Prob TRANSUDATE w/ TProt<3.0, LDH=95, Cytology=NEG (reactive mesothelial cells only);  Cults=> no growth    ~  03/2016 developed cough/ wheezing/ low grade fever=> CXR w/ improved RUL, ?incr markings R base,  he was off the Pred, given more Levaquin...     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    ~  He saw CARDS PA 03/01/16> no change in meds, Lexiscan Myoview 03/06/16 showed hi risk study w/ EF=30%, no ST segm changes, c/w antero-apical MI & no evid reversible ischemia...    ASPVD- s/p AAA stent graft 11/2012, mod bilat carotid occlusive dis (asymptomatic) w/ CDopplers showing ~50-60% bilat ICAstenoses>  [facility]- plans yearly f/u CT Abd and CDopplers    MEDICAL issues>  HBP, HL, GERD/ Divertic/ Polyps, CKD-stage3, Anemia & thrombocytopenia>  On Simva80-1/2Qd, Prilosec20/d, Fe/ MVI etc... EXAM shows Afeb, VSS, O2sat=95% on RA; Wt=183# (up 5#);  HEENT- neg, mallampati2;  Chest- sl decr BS right base, few rhonchi, w/o w/r/consolidation;  Heart- RR Gr1/6SEM w/o r/g;  Abd- soft, neg;  Ext- neg w/o c/c/e...  CT Chest 05/10/16 showed more confluent airsp dis in RUL w/ vol loss, abn soft tissue attenuation in right hilum has progressed (?felt to be treatment related), stable subcarinal LN ~1148mmoderate right effusion.  CXR 06/06/16> progressive incr density in RUL & vol loss, soft tissue fullness in right hilum, s/p CABG and calcif in wall of Ao arch  PET scan 06/15/16> persistent but diminished hypermetabolism in the RUL area of interstitial &  airsp opac- likely related to XRT, no hypermetabolism in the prev R paratrachial LN, similar sized subcarinal LN w/ low level hypermetabolism noted; new area of LUL anterior  interstitial & airsp dis w/ some bronchiectasis shows low level hypermetabolism (?radiation change?); moderate right effusion...  IMP/PLAN>>  He is feeling better, performance status improving, CXR shows progressive changes in RUL s/p chemoradiation; CT=>PET scans w/ vol loss & persistent hypermetabolism/ evolving changes in RUL & right hilum plus he has a mod large right effusion; prev thoracentesis 01/2016 in hosp was transudative- I believe he would benefit from further eval/ repeat thoracentesis to recheck this fluid; we will set this up via IR & ask them to drain the fluid as much as poss, send it for Cytology, cell ct & diff, TProt/ LDH/ Gluc...   ~  August 06, 2016:  19moROV w/ SN>  BDravinreports that his SOB & energy have improved, appetite is better; he is more active walking 275m5d/wk + yard work/ mowing/ leaves/ etc; he notes min cough, small amt beige sput, stable DOE, no CP or edema... He tells me that DrMohamed has CT Chest & LABS ordered for next week so we will wait for these tests & review when avail...     COPD (Stage3 w/ signif revers component), former smoker (quit 1996 w/ ~40+ pack-yr hx)>  On Symbicort160-2spBid regularly ([facility] changed [facility]), VentolinHFA rescue inhaler as needed...    He saw DrCherly Hensenor CARDS 07/04/16>  HBP, CAD- s/p CABG 1996 & subseq PCI to graft vessels, chr combined sys&diast CHF w/ cardiomyopathy, PVD- w/ carotid dis & Ao stent graft per drLawson, HL, CKD;  No changes made to his ASA/ Plavix, Metoprolol50-1.5Bid, Lasix40, Simva40...     EXAM shows Afeb, VSS, O2sat=98% on RA; Wt=187# (up 4#);  HEENT- neg, mallampati2;  Chest- sl decr BS right base, few rhonchi, w/o w/r/consolidation;  Heart- RR Gr1/6SEM w/o r/g;  Abd- soft, neg;  Ext- neg w/o c/c/e...  Throacentesis 06/22/17 by IR>  1.2L removed (hazy yellow fluid)=> Cell ct=923 cells w/ 2 Neutro/ 58 lymph/ 40 mono-macrophages;  Chems- LDH=119, TPro=4.1, Gluc=101;  Cyto= Atypic cells c/w reactive mesothel  cells.  Post thoracentesis CXR 06/22/17>  decr right effusion, no pneumoth, dense consolid RUL unchanged, post-op BABG...  CT Chest 08/14/16>  Norm heart size- s/p CABG & Ao atherosclerotic changes; stable upper lim adenopathy; decr right effusion- now small in size; similar interstitial & airsp dis in right perihilar & medial LUL is c/w radiation fibrosis; no signif pulm nodules or masses- no new or progressive dis...   LABS 08/14/17>  Chems- ok x Cr=1.6;  CBC- ok w/ Hg=12.7...Marland KitchenMarland KitchenMP/PLAN>>  BiFinlays clinically stable, no clear evid of recurrent dis & followed very closely by DrMohamed; Pulm stable on Symbicort Bid, Albut rescue prn, and his exercise- continue same 7 we plan rov recheck in 57m77mo    Past Medical History:  Diagnosis Date  . AAA (abdominal aortic aneurysm) ([place]CC[place]010   4.4 cm 08/2008;4.44 in 7/10 and 4.65 in 08/2009; 4.8 by CT in 11/2009; 4.3 by ultrasound in 08/2010  . Arteriosclerotic cardiovascular disease (ASCVD) 1996   CABG-1996  . Arthritis    "fingers" (03/18/2014)  . CAD (coronary artery disease)    03/18/14:  PCI with DES to distal left main. 7/29: DES to the SVG to Diag  . Cancer (HCCG[place]  Upper right lobe lung cancer  . Cardiomyopathy, ischemic    Echo 03/17/14: EF 45-50%  . Chronic bronchitis (  HCC)   . Chronic rhinitis   . Colonic polyp 2002   polypectomy in 2002  . COPD (chronic obstructive pulmonary disease) ([place])   . Diverticulosis   . ED (erectile dysfunction)   . Encounter for antineoplastic chemotherapy 12/19/2015  . GERD (gastroesophageal reflux disease)   . Hyperlipidemia   . Hypertension   . IFG (impaired fasting glucose)   . Myocardial infarction    "told h/o silent MI sometime before 1996"  . Pneumonia ~ 2001; ~ 2005  . Right bundle branch block   . Tobacco abuse, in remission    40 pack year total consumption; discontinued in 1996    Past Surgical History:  Procedure Laterality Date  . ABDOMINAL AORTIC ANEURYSM REPAIR  11/2012  . ABDOMINAL  AORTIC ENDOVASCULAR STENT GRAFT N/A 12/11/2012   Procedure: ABDOMINAL AORTIC ENDOVASCULAR STENT GRAFT;  Surgeon: Mal Misty, MD;  Location: [place];  Service: Vascular;  Laterality: N/A;  Ultrasound guided; [place]  . CARDIAC CATHETERIZATION  01/08/1995  . COLONOSCOPY  2002   polypectomy-patient denies  . CORONARY ANGIOPLASTY WITH STENT PLACEMENT  03/18/2014   "1"  . CORONARY ANGIOPLASTY WITH STENT PLACEMENT  03/24/2014   "1"  . CORONARY ARTERY BYPASS GRAFT  01/09/1995   "CABG X3"  . JOINT REPLACEMENT    . LAPAROSCOPIC CHOLECYSTECTOMY  12/2009  . LEFT AND RIGHT HEART CATHETERIZATION WITH CORONARY/GRAFT ANGIOGRAM N/A 03/18/2014   Procedure: LEFT AND RIGHT HEART CATHETERIZATION WITH Beatrix Fetters;  Surgeon: Blane Ohara, MD;  [facility] LAB;  Service: Cardiovascular;  Laterality: N/A;  . PERCUTANEOUS CORONARY STENT INTERVENTION (PCI-S)  03/18/2014   Procedure: PERCUTANEOUS CORONARY STENT INTERVENTION (PCI-S);  Surgeon: Blane Ohara, MD;  Location: [facility] CATH LAB;  Service: Cardiovascular;;  . PERCUTANEOUS CORONARY STENT INTERVENTION (PCI-S) N/A 03/24/2014   Procedure: PERCUTANEOUS CORONARY STENT INTERVENTION (PCI-S);  Surgeon: Blane Ohara, MD;  Location[facility] CATH LAB;  Service: Cardiovascular;  Laterality: N/A;  . TOTAL HIP ARTHROPLASTY Left 01/21/2013   Procedure: TOTAL HIP ARTHROPLASTY ANTERIOR APPROACH;  Surgeon: Mauri Pole, MD;  Location: [place];  Service: Orthopedics;  Laterality: Left;  Marland Kitchen VIDEO BRONCHOSCOPY N/A 11/17/2015   Procedure: VIDEO BRONCHOSCOPY WITH FLUORO;  Surgeon: Rigoberto Noel, MD;  Location: [place];  Service: Cardiopulmonary;  Laterality: N/A;    Outpatient Encounter Prescriptions as of 08/06/2016  Medication Sig  . acetaminophen (TYLENOL) 500 MG tablet Take 500 mg by mouth every 6 (six) hours as needed for mild pain.  Marland Kitchen aspirin EC 81 MG tablet Take 81 mg by mouth daily.  . budesonide-formoterol (SYMBICORT) 160-4.5 MCG/ACT inhaler Inhale 2 puffs  into the lungs 2 (two) times daily.  . clopidogrel (PLAVIX) 75 MG tablet Take 1 tablet (75 mg total) by mouth daily. Reported on 12/02/2015  . Ferrous Sulfate (IRON) 28 MG TABS Take 28 mg by mouth daily.  . furosemide (LASIX) 40 MG tablet Take 1 tablet (40 mg total) by mouth daily.  . Metoprolol Tartrate 75 MG TABS Take 1 tablet by mouth 2 (two) times daily.  . Multiple Vitamins-Minerals (CENTRUM SILVER ADULT 50+) TABS Take 1 tablet by mouth daily.   . nitroGLYCERIN (NITROSTAT) 0.4 MG SL tablet Place 1 tablet (0.4 mg total) under the tongue every 5 (five) minutes as needed for chest pain.  Marland Kitchen omeprazole (PRILOSEC) 20 MG capsule TAKE ONE CAPSULE BY MOUTH DAILY.  . simvastatin (ZOCOR) 80 MG tablet Take 0.5 tablets (40 mg total) by mouth at bedtime.  . VENTOLIN HFA 108 (  90 Base) MCG/ACT inhaler INHALE 2 PUFFS BY MOUTH EVERY 4 TO 6 HOURS AS NEEDED FOR WHEEZING.   No facility-administered encounter medications on file as of 08/06/2016.     Allergies  Allergen Reactions  . Neomycin Hives    Immunization History  Administered Date(s) Administered  . H1N1 08/06/2008  . Influenza Split 06/24/2013  . Influenza,inj,Quad PF,36+ Mos 06/15/2014, 05/31/2015, 05/09/2016  . Influenza-Unspecified 05/27/2012  . Pneumococcal Conjugate-13 08/18/2014  . Pneumococcal Polysaccharide-23 03/27/2012  . Zoster 07/27/2008    Current Medications, Allergies, Past Medical History, Past Surgical History, Family History, and Social History were reviewed in [employer] record.   Review of Systems             All symptoms NEG except where BOLDED >>  Constitutional:  F/C/S, fatigue, anorexia, unexpected weight change. HEENT:  HA, visual changes, hearing loss, earache, nasal symptoms, sore throat, mouth sores, hoarseness. Resp:  cough, sputum, hemoptysis; SOB, tightness, wheezing. Cardio:  CP, palpit, DOE, orthopnea, edema. GI:  N/V/D/C, blood in stool; reflux, abd pain, distention, gas. GU:   dysuria, freq, urgency, hematuria, flank pain, voiding difficulty. MS:  joint pain, swelling, tenderness, decr ROM; neck pain, back pain, etc. Neuro:  HA, tremors, seizures, dizziness, syncope, weakness, numbness, gait abn. Skin:  suspicious lesions or skin rash. Heme:  adenopathy, bruising, bleeding. Psyche:  confusion, agitation, sleep disturbance, hallucinations, anxiety, depression suicidal.   Objective:   Physical Exam       Vital Signs:  Reviewed...   General:  WD, WN, 75 y/o WM in NAD; alert & oriented; pleasant & cooperative... HEENT:  [place]/AT; Conjunctiva- pink, Sclera- nonicteric, EOM-wnl, PERRLA, EACs-clear, TMs-wnl; NOSE-clear; THROAT-clear & wnl.  Neck:  Supple w/ fair ROM; no JVD; normal carotid impulses w/ faint bruits; no thyromegaly or nodules palpated; no lymphadenopathy.  Chest:  decr BS right base & clear w/o w/r/r & no signs of consolid. Heart:  Regular Rhythm; norm S1 & S2 w/ Gr1/6 SEM without rubs or gallops detected. Abdomen:  Soft & nontender- no guarding or rebound; normal bowel sounds; no organomegaly or masses palpated. Ext:  Sl decr ROM; without deformities +arthritic changes; no varicose veins, +venous insuffic, tr edema;  Pulses decr w/o bruits. Neuro:  CNs II-XII intact; motor testing normal; sensory testing normal; gait normal & balance OK. Derm:  No lesions noted; no rash etc. Lymph:  No cervical, supraclavicular, axillary, or inguinal adenopathy palpated.   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     ASPVD- s/p AAA stent graft 11/2012, mod bilat carotid occlusive dis (asymptomatic) w/ CDopplers showing ~50-60% bilat ICAstenoses>  Stable & seen by [facility] 01/2016- plans yearly f/u CT Abd and CDopplers    MEDICAL issues>  HBP, HL, GERD/ Divertic/ Polyps, CKD-stage3, Anemia & thrombocytopenia>  On Simva80-1/2Qd, Prilosec20/d, Fe/ MVI/ etc...  PLAN>>  02/29/16>   Problem  list as above w/ signif cardiac, pulmonary, & post chemoradiation changes;  From the pulm standpoint- he feels the BREO is helping & hasn't needed the rescue inhaler very often, continue same;  He is on a long PRED taper per DrManning for poss radiation fibrosis RUL, Sed rate is 53, continue this taper & careful w/ sweets/ sugar etc;  From the cardiac standpoint- he has signif underlying dis w/ Lmain stent, s/p CABG 20 yrs ago, prob ischemic cardiomyopathy w/ worsening 2DEcho recently w/ 30-35% EF + HK&AK seen, BNP=1053, Cards plans ischemic work up when able, in the meanwhile he has improved w/ diuresis but Chems w/ mild RI & HCO3=36... REC- add DIAMOX-ER 500 one tab daily at 4pm, continue his Lasix40 Qam, increase water intake, watch weights... We plan ROV 85monthrecheck... 04/04/16>   Discussed w/ Paul Sandoval-- continue the Levaquin til gone, use Tylenol for fever or pain; continue the Breo daily & Ventolin-HFA as needed; wean off the Diamox at this time, continue Lasix40, no salt, etc; he needs to gradually increase his exercise/ mobility; we will plan ROV w/ labs in 239mo 06/06/16>   He is feeling better, performance status improving, CXR shows progressive changes in RUL s/p chemoradiation; CT=>PET scans w/ vol loss & persistent hypermetabolism/ evolving changes in RUL & right hilum plus he has a mod large right effusion; prev thoracentesis 01/2016 in hosp was transudative- I believe he  would benefit from further eval/ repeat thoracentesis to recheck this fluid; we will set this up via IR & ask them to drain the fluid as much as poss, send it for Cytology, cell ct & diff, TProt/ LDH/ Gluc => 1.2L removed, prob exudate, NEG cytology, & we will follow... 08/06/16>   [employer] clinically stable, no clear evid of recurrent dis & followed very closely by DrMohamed; Pulm stable on Symbicort Bid, Albut rescue prn, and his exercise- continue same 7 we plan rov recheck in 65m41mo  Plan:     Patient's Medications  New Prescriptions   No medications on file  Previous Medications   ACETAMINOPHEN (TYLENOL) 500 MG TABLET    Take 500 mg by mouth every 6 (six) hours as needed for mild pain.   ASPIRIN EC 81 MG TABLET    Take 81 mg by mouth daily.   BUDESONIDE-FORMOTEROL (SYMBICORT) 160-4.5 MCG/ACT INHALER    Inhale 2 puffs into the lungs 2 (two) times daily.   CLOPIDOGREL (PLAVIX) 75 MG TABLET    Take 1 tablet (75 mg total) by mouth daily. Reported on 12/02/2015   FERROUS SULFATE (IRON) 28 MG TABS    Take 28 mg by mouth daily.   FUROSEMIDE (LASIX) 40 MG TABLET    Take 1 tablet (40 mg total) by mouth daily.   METOPROLOL TARTRATE 75 MG TABS    Take 1 tablet by mouth 2 (two) times daily.   MULTIPLE VITAMINS-MINERALS (CENTRUM SILVER ADULT 50+) TABS    Take 1 tablet by mouth daily.    NITROGLYCERIN (NITROSTAT) 0.4 MG SL TABLET    Place 1 tablet (0.4 mg total) under the tongue every 5 (five) minutes as needed for chest pain.   OMEPRAZOLE (PRILOSEC) 20 MG CAPSULE    TAKE ONE CAPSULE BY MOUTH DAILY.   SIMVASTATIN (ZOCOR) 80 MG TABLET    Take 0.5 tablets (40 mg total) by mouth at bedtime.   VENTOLIN HFA 108 (90 BASE) MCG/ACT INHALER    INHALE 2 PUFFS BY MOUTH EVERY 4 TO 6 HOURS AS  NEEDED FOR WHEEZING.  Modified Medications   No medications on file  Discontinued Medications   No medications on file

## 2016-08-08 ENCOUNTER — Ambulatory Visit (INDEPENDENT_AMBULATORY_CARE_PROVIDER_SITE_OTHER): Payer: Medicare Other | Admitting: Family Medicine

## 2016-08-08 ENCOUNTER — Encounter: Payer: Self-pay | Admitting: Family Medicine

## 2016-08-08 VITALS — BP 122/78 | Ht 70.0 in | Wt 188.2 lb

## 2016-08-08 DIAGNOSIS — J449 Chronic obstructive pulmonary disease, unspecified: Secondary | ICD-10-CM

## 2016-08-08 DIAGNOSIS — I10 Essential (primary) hypertension: Secondary | ICD-10-CM

## 2016-08-08 DIAGNOSIS — E7800 Pure hypercholesterolemia, unspecified: Secondary | ICD-10-CM

## 2016-08-08 NOTE — Progress Notes (Signed)
   Subjective:    Patient ID: Paul Sandoval, male    DOB: 11/27/1940, 75 y.o.   MRN: [id number] Patient arrives office with numerous concerns Hypertension  This is a chronic problem. The current episode started more than 1 year ago. Risk factors for coronary artery disease include dyslipidemia and male gender. Treatments tried: metoprolol, lasix. There are no compliance problems.    Breathing somewhat better, still dealing with his pulmonary issues. 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 Continue to be monitored for recurrence of pulmonary cancer due to have another scan later this month. Ongoing history of reactive airways history of asthma currently states that BRE of his helping his breathing  ip Patient continues to take lipid medication regularly. No obvious side effects from it. Generally does not miss a dose. Prior blood work results are reviewed with patient. Patient continues to work on fat intake in diet  .l     Review of Systems No headache, no major weight loss or weight gain, no chest pain no back pain abdominal pain no change in bowel habits complete ROS otherwise negative     Objective:   Physical Exam Alert vitals stable, NAD. Blood pressure good on repeat. HEENT normal. Lungs clear. Heart regular rate and rhythm.        Assessment & Plan:Impression 1 hypertension discussed good control maintain same #2 hyperlipidemia prior blood work discussed reviewed to maintain same #3 lung cancer discuss including long-term ramifications. #4 heart disease discussed including importance of compliance blood work reviewed. Follow-up as scheduled

## 2016-08-14 ENCOUNTER — Ambulatory Visit (HOSPITAL_COMMUNITY)
Admission: RE | Admit: 2016-08-14 | Discharge: 2016-08-14 | Disposition: A | Discharge status: Home / Self Care | Payer: Medicare Other | Source: Ambulatory Visit | Attending: Internal Medicine | Admitting: Internal Medicine

## 2016-08-14 ENCOUNTER — Encounter (HOSPITAL_COMMUNITY): Payer: Self-pay

## 2016-08-14 ENCOUNTER — Other Ambulatory Visit (HOSPITAL_BASED_OUTPATIENT_CLINIC_OR_DEPARTMENT_OTHER): Payer: Medicare Other

## 2016-08-14 DIAGNOSIS — J90 Pleural effusion, not elsewhere classified: Secondary | ICD-10-CM | POA: Insufficient documentation

## 2016-08-14 DIAGNOSIS — C3411 Malignant neoplasm of upper lobe, right bronchus or lung: Secondary | ICD-10-CM

## 2016-08-14 DIAGNOSIS — C3490 Malignant neoplasm of unspecified part of unspecified bronchus or lung: Secondary | ICD-10-CM | POA: Diagnosis not present

## 2016-08-14 LAB — CBC WITH DIFFERENTIAL/PLATELET
BASO%: 0.5 % (ref 0.0–2.0)
BASOS ABS: 0 10*3/uL (ref 0.0–0.1)
EOS ABS: 0.1 10*3/uL (ref 0.0–0.5)
EOS%: 0.7 % (ref 0.0–7.0)
HEMATOCRIT: 39.5 % (ref 38.4–49.9)
HGB: 12.7 g/dL — ABNORMAL LOW (ref 13.0–17.1)
LYMPH#: 1.1 10*3/uL (ref 0.9–3.3)
LYMPH%: 12.6 % — AB (ref 14.0–49.0)
MCH: 28.5 pg (ref 27.2–33.4)
MCHC: 32.1 g/dL (ref 32.0–36.0)
MCV: 88.8 fL (ref 79.3–98.0)
MONO#: 0.7 10*3/uL (ref 0.1–0.9)
MONO%: 8.4 % (ref 0.0–14.0)
NEUT#: 6.8 10*3/uL — ABNORMAL HIGH (ref 1.5–6.5)
NEUT%: 77.8 % — AB (ref 39.0–75.0)
PLATELETS: 135 10*3/uL — AB (ref 140–400)
RBC: 4.45 10*6/uL (ref 4.20–5.82)
RDW: 15.5 % — ABNORMAL HIGH (ref 11.0–14.6)
WBC: 8.8 10*3/uL (ref 4.0–10.3)

## 2016-08-14 LAB — COMPREHENSIVE METABOLIC PANEL
ALBUMIN: 3.5 g/dL (ref 3.5–5.0)
ALT: 14 U/L (ref 0–55)
AST: 18 U/L (ref 5–34)
Alkaline Phosphatase: 100 U/L (ref 40–150)
Anion Gap: 7 mEq/L (ref 3–11)
BUN: 24.2 mg/dL (ref 7.0–26.0)
CHLORIDE: 105 meq/L (ref 98–109)
CO2: 29 meq/L (ref 22–29)
Calcium: 9.4 mg/dL (ref 8.4–10.4)
Creatinine: 1.6 mg/dL — ABNORMAL HIGH (ref 0.7–1.3)
EGFR: 42 mL/min/{1.73_m2} — AB (ref >90)
GLUCOSE: 100 mg/dL (ref 70–140)
POTASSIUM: 4.3 meq/L (ref 3.5–5.1)
SODIUM: 141 meq/L (ref 136–145)
Total Bilirubin: 0.55 mg/dL (ref 0.20–1.20)
Total Protein: 7.3 g/dL (ref 6.4–8.3)

## 2016-08-14 MED ORDER — IOPAMIDOL (ISOVUE-300) INJECTION 61%
INTRAVENOUS | Status: AC
  Administered 2016-08-14: 60 mL via INTRAVENOUS
  Filled 2016-08-14: qty 75

## 2016-08-14 MED ORDER — IOPAMIDOL (ISOVUE-300) INJECTION 61%
75.0000 mL | Freq: Once | INTRAVENOUS | Status: AC | PRN
  Administered 2016-08-14: 60 mL via INTRAVENOUS

## 2016-08-15 ENCOUNTER — Telehealth: Payer: Self-pay | Admitting: Internal Medicine

## 2016-08-15 ENCOUNTER — Ambulatory Visit (HOSPITAL_BASED_OUTPATIENT_CLINIC_OR_DEPARTMENT_OTHER): Payer: Medicare Other | Admitting: Internal Medicine

## 2016-08-15 ENCOUNTER — Encounter: Payer: Self-pay | Admitting: Internal Medicine

## 2016-08-15 VITALS — BP 137/56 | HR 76 | Temp 97.7°F | Resp 17 | Ht 70.0 in | Wt 189.4 lb

## 2016-08-15 DIAGNOSIS — C3411 Malignant neoplasm of upper lobe, right bronchus or lung: Secondary | ICD-10-CM

## 2016-08-15 NOTE — Progress Notes (Signed)
Paul Sandoval [phone]   [phone]  OFFICE PROGRESS NOTE  Paul [address], Paul Sandoval [place] [address]  DIAGNOSIS: Stage IIIA (T2b, N2, M0) non-small cell lung cancer, favoring squamous cell carcinoma presented with right upper lobe lung mass in addition to mediastinal lymphadenopathy diagnosed in March 2017.  PRIOR THERAPY:  A course of concurrent chemoradiation with weekly carboplatin for AUC of 2 and paclitaxel 45 MG/M2. Status post 5 cycle with partial response.  CURRENT THERAPY: Observation  INTERVAL HISTORY: Paul Sandoval 75 y.o. male returns to the clinic today for follow-up visit. The patient is feeling fine today with no specific complaints. He exercises at regular basis. He denied having any chest pain, shortness of breath, cough or hemoptysis. He has no fever or chills. He denied having any weight loss or night sweats. He has no nausea or vomiting. He has been on observation for the last few months. He had repeat CT scan of the chest performed recently and he is here for evaluation and discussion of his scan results.  MEDICAL HISTORY: Past Medical History:  Diagnosis Date  . AAA (abdominal aortic aneurysm) ([place]) 2010   4.4 cm 08/2008;4.44 in 7/10 and 4.65 in 08/2009; 4.8 by CT in 11/2009; 4.3 by ultrasound in 08/2010  . Arteriosclerotic cardiovascular disease (ASCVD) 1996   CABG-1996  . Arthritis    "fingers" (03/18/2014)  . CAD (coronary artery disease)    03/18/14:  PCI with DES to distal left main. 7/29: DES to the SVG to Diag  . Cancer ([place])    Upper right lobe lung cancer  . Cardiomyopathy, ischemic    Echo 03/17/14: EF 45-50%  . Chronic bronchitis ([place])   . Chronic rhinitis   . Colonic polyp 2002   polypectomy in 2002  . COPD (chronic obstructive pulmonary disease) ([place])   . Diverticulosis   . ED (erectile dysfunction)   . Encounter for antineoplastic chemotherapy 12/19/2015  . GERD (gastroesophageal  reflux disease)   . Hyperlipidemia   . Hypertension   . IFG (impaired fasting glucose)   . Myocardial infarction    "told h/o silent MI sometime before 1996"  . Pneumonia ~ 2001; ~ 2005  . Right bundle branch block   . Tobacco abuse, in remission    40 pack year total consumption; discontinued in 1996    ALLERGIES:  is allergic to neomycin.  MEDICATIONS:  Current Outpatient Prescriptions  Medication Sig Dispense Refill  . aspirin EC 81 MG tablet Take 81 mg by mouth daily.    . budesonide-formoterol (SYMBICORT) 160-4.5 MCG/ACT inhaler Inhale 2 puffs into the lungs 2 (two) times daily.    . clopidogrel (PLAVIX) 75 MG tablet Take 1 tablet (75 mg total) by mouth daily. Reported on 12/02/2015 90 tablet 3  . Ferrous Sulfate (IRON) 28 MG TABS Take 28 mg by mouth daily.    . furosemide (LASIX) 40 MG tablet Take 1 tablet (40 mg total) by mouth daily. 30 tablet 11  . Metoprolol Tartrate 75 MG TABS Take 1 tablet by mouth 2 (two) times daily. 180 tablet 1  . Multiple Vitamins-Minerals (CENTRUM SILVER ADULT 50+) TABS Take 1 tablet by mouth daily.     Marland Kitchen omeprazole (PRILOSEC) 20 MG capsule TAKE ONE CAPSULE BY MOUTH DAILY. 90 capsule 0  . simvastatin (ZOCOR) 80 MG tablet Take 0.5 tablets (40 mg total) by mouth at bedtime. 15 tablet 5  . VENTOLIN HFA 108 (90 Base) MCG/ACT  inhaler INHALE 2 PUFFS BY MOUTH EVERY 4 TO 6 HOURS AS NEEDED FOR WHEEZING. 18 g 5  . acetaminophen (TYLENOL) 500 MG tablet Take 500 mg by mouth every 6 (six) hours as needed for mild pain.    . nitroGLYCERIN (NITROSTAT) 0.4 MG SL tablet Place 1 tablet (0.4 mg total) under the tongue every 5 (five) minutes as needed for chest pain. (Patient not taking: Reported on 08/15/2016) 25 tablet 4   No current facility-administered medications for this visit.     SURGICAL HISTORY:  Past Surgical History:  Procedure Laterality Date  . ABDOMINAL AORTIC ANEURYSM REPAIR  11/2012  . ABDOMINAL AORTIC ENDOVASCULAR STENT GRAFT N/A 12/11/2012    Procedure: ABDOMINAL AORTIC ENDOVASCULAR STENT GRAFT;  Surgeon: Mal Misty, Paul Sandoval;  Location: [facility];  Service: Vascular;  Laterality: N/A;  Ultrasound guided; [place]  . CARDIAC CATHETERIZATION  01/08/1995  . COLONOSCOPY  2002   polypectomy-patient denies  . CORONARY ANGIOPLASTY WITH STENT PLACEMENT  03/18/2014   "1"  . CORONARY ANGIOPLASTY WITH STENT PLACEMENT  03/24/2014   "1"  . CORONARY ARTERY BYPASS GRAFT  01/09/1995   "CABG X3"  . JOINT REPLACEMENT    . LAPAROSCOPIC CHOLECYSTECTOMY  12/2009  . LEFT AND RIGHT HEART CATHETERIZATION WITH CORONARY/GRAFT ANGIOGRAM N/A 03/18/2014   Procedure: LEFT AND RIGHT HEART CATHETERIZATION WITH Beatrix Fetters;  Surgeon: Blane Ohara, Paul Sandoval;  Location[facility] CATH LAB;  Service: Cardiovascular;  Laterality: N/A;  . PERCUTANEOUS CORONARY STENT INTERVENTION (PCI-S)  03/18/2014   Procedure: PERCUTANEOUS CORONARY STENT INTERVENTION (PCI-S);  Surgeon: Blane Ohara, Paul Sandoval;  Location: [facility] CATH LAB;  Service: Cardiovascular;;  . PERCUTANEOUS CORONARY STENT INTERVENTION (PCI-S) N/A 03/24/2014   Procedure: PERCUTANEOUS CORONARY STENT INTERVENTION (PCI-S);  Surgeon: Blane Ohara, Paul Sandoval;  Location: [facility] CATH LAB;  Service: Cardiovascular;  Laterality: N/A;  . TOTAL HIP ARTHROPLASTY Left 01/21/2013   Procedure: TOTAL HIP ARTHROPLASTY ANTERIOR APPROACH;  Surgeon: Mauri Pole, Paul Sandoval;  Location: [facility];  Service: Orthopedics;  Laterality: Left;  Marland Kitchen VIDEO BRONCHOSCOPY N/A 11/17/2015   Procedure: VIDEO BRONCHOSCOPY WITH FLUORO;  Surgeon: Rigoberto Noel, Paul Sandoval;  Location: [facility];  Service: Cardiopulmonary;  Laterality: N/A;    REVIEW OF SYSTEMS:  A comprehensive review of systems was negative.   PHYSICAL EXAMINATION: General appearance: alert, cooperative and no distress Head: Normocephalic, without obvious abnormality, atraumatic Neck: no adenopathy, no JVD, supple, symmetrical, trachea midline and thyroid not enlarged, symmetric, no tenderness/mass/nodules Lymph nodes:  Cervical, supraclavicular, and axillary nodes normal. Resp: clear to auscultation bilaterally Back: symmetric, no curvature. ROM normal. No CVA tenderness. Cardio: regular rate and rhythm, S1, S2 normal, no murmur, click, rub or gallop GI: soft, non-tender; bowel sounds normal; no masses,  no organomegaly Extremities: extremities normal, atraumatic, no cyanosis or edema  ECOG PERFORMANCE STATUS: 0 - Asymptomatic  Blood pressure (!) 137/56, pulse 76, temperature 97.7 F (36.5 C), temperature source Oral, resp. rate 17, height '5\' 10"'$  (1.778 m), weight 189 lb 6.4 oz (85.9 kg), SpO2 97 %.  LABORATORY DATA: Lab Results  Component Value Date   WBC 8.8 08/14/2016   HGB 12.7 (L) 08/14/2016   HCT 39.5 08/14/2016   MCV 88.8 08/14/2016   PLT 135 (L) 08/14/2016      Chemistry      Component Value Date/Time   NA 141 08/14/2016 1002   K 4.3 08/14/2016 1002   CL 96 02/29/2016 1218   CO2 29 08/14/2016 1002   BUN 24.2 08/14/2016 1002   CREATININE 1.6 (  H) 08/14/2016 1002      Component Value Date/Time   CALCIUM 9.4 08/14/2016 1002   ALKPHOS 100 08/14/2016 1002   AST 18 08/14/2016 1002   ALT 14 08/14/2016 1002   BILITOT 0.55 08/14/2016 1002       RADIOGRAPHIC STUDIES: Ct Chest W Contrast  Result Date: 08/14/2016 CLINICAL DATA:  Non-small-cell lung cancer. EXAM: CT CHEST WITH CONTRAST TECHNIQUE: Multidetector CT imaging of the chest was performed during intravenous contrast administration. CONTRAST:  44m ISOVUE-300 IOPAMIDOL (ISOVUE-300) INJECTION 61% COMPARISON:  PET-CT 06/15/2016.  Chest CT 05/10/2016 FINDINGS: Cardiovascular: Heart size normal. Patient is status post CABG. Atherosclerotic calcification is noted in the wall of the thoracic aorta. Mediastinum/Nodes: 11 mm short axis subcarinal lymph node is now 10 mm. 9 mm short axis right paratracheal lymph node measured previously is unchanged. No mediastinal lymphadenopathy. There is no hilar lymphadenopathy. There is no axillary  lymphadenopathy. Lungs/Pleura: Right pleural effusion has decreased in the interval. Interstitial and airspace disease in the right parahilar lung is compatible with radiation fibrosis, similar to prior. Interstitial and airspace disease in the medial left upper lobe anteriorly is also stable and may reflect post radiation scarring. Subtle tree-in-bud opacity in the posterior left upper lobe is stable. No overtly suspicious pulmonary nodule or mass on the current study. Upper Abdomen: Unremarkable. Musculoskeletal: Bone windows reveal no worrisome lytic or sclerotic osseous lesions. IMPRESSION: 1. Interval decrease in right pleural effusion, now small in size. 2. No substantial change and apparent post radiation scarring in the medial right upper lung and antero medial left upper lobe. 3. No change and upper normal mediastinal lymph nodes. 4. No new or progressive findings on the current study. Electronically Signed   By: EMisty StanleyM.D.   On: 08/14/2016 15:24    ASSESSMENT AND PLAN: This is a very pleasant 75years old white male with stage IIIa non-small cell lung cancer status post a course of concurrent chemoradiation with weekly carboplatin and paclitaxel.  He had partial response after his treatment.  He is currently on observation and the recent CT scan of the chest showed no evidence for disease progression. I discussed the scan results with the patient today. I recommended for him to continue on observation with repeat CT scan of the chest in 3 months. He was advised to call immediately if he has any concerning symptoms in the interval. The patient voices understanding of current disease status and treatment options and is in agreement with the current care plan.  All questions were answered. The patient knows to call the clinic with any problems, questions or concerns. We can certainly see the patient much sooner if necessary. I spent 10 minutes counseling the patient face to face. The total  time spent in the appointment was 15 minutes. Disclaimer: This note was dictated with voice recognition software. Similar sounding words can inadvertently be transcribed and may not be corrected upon review.

## 2016-08-15 NOTE — Telephone Encounter (Signed)
Appointments scheduled per 08/15/16 los.  A copy of the appointment schedule and AVS report was given to the patient, per 08/15/16 los.

## 2016-09-06 ENCOUNTER — Ambulatory Visit: Payer: Medicare Other | Admitting: Pulmonary Disease

## 2016-10-17 ENCOUNTER — Other Ambulatory Visit: Payer: Self-pay | Admitting: Family Medicine

## 2016-10-18 ENCOUNTER — Encounter: Payer: Self-pay | Admitting: *Deleted

## 2016-10-18 ENCOUNTER — Telehealth: Payer: Self-pay | Admitting: *Deleted

## 2016-10-18 NOTE — Telephone Encounter (Signed)
I was notified that CT Chest is authorized and I called Paul Sandoval with the information.  He has the phone number to call central scheduling to schedule scan.  He was thankful for the call and update.  I asked that he call if he has trouble scheduling scan.  He stated he would call.

## 2016-10-18 NOTE — Telephone Encounter (Signed)
"  I need to speak with the Navigator.  I'm scheduled for lab 11-12-2016 and Dr. Julien Nordmann 11-15-2016.  I'm supposed to have CT chest 11-13-2016 but have not heard anything about this being scheduled yet."  Orders present but referral is incomplete.  Will notify Diagnostic Pre-Certification staff.  Provided patient with Radiology scheduling number as he asked what can he do.  "I pay high cost for insurance and would like to be notified of the outcome."

## 2016-10-18 NOTE — Telephone Encounter (Signed)
Thanks for the update.  I checked on patient's authorization for scan and notified the patient to call tomorrow to schedule. Thank you

## 2016-10-18 NOTE — Progress Notes (Signed)
Oncology Nurse Navigator Documentation  Oncology Nurse Navigator Flowsheets 10/18/2016  Navigator Location [facility]  Navigator Encounter Type Other/Mr. Nield called triage nurse regarding scan not scheduled before he sees Dr. Julien Nordmann in March.  I contacted authorization team to see if authorized.   Treatment Phase Post-Tx Follow-up  Barriers/Navigation Needs Coordination of Care  Interventions Coordination of Care  Coordination of Care Other  Acuity Level 1  Time Spent with Patient 30

## 2016-10-26 ENCOUNTER — Telehealth: Payer: Self-pay | Admitting: Internal Medicine

## 2016-10-26 NOTE — Telephone Encounter (Signed)
Patient called to confirm appt time and reschedule lab on 3/5 to an earlier time.

## 2016-10-26 NOTE — Telephone Encounter (Signed)
lvm to inform pt of lab only appt 3/5 at 1 pm per lOS

## 2016-10-29 ENCOUNTER — Other Ambulatory Visit: Payer: Medicare Other

## 2016-10-29 ENCOUNTER — Encounter: Payer: Self-pay | Admitting: *Deleted

## 2016-10-29 ENCOUNTER — Other Ambulatory Visit (HOSPITAL_BASED_OUTPATIENT_CLINIC_OR_DEPARTMENT_OTHER): Payer: Medicare Other

## 2016-10-29 DIAGNOSIS — C3411 Malignant neoplasm of upper lobe, right bronchus or lung: Secondary | ICD-10-CM | POA: Diagnosis not present

## 2016-10-29 LAB — COMPREHENSIVE METABOLIC PANEL
ALBUMIN: 3.5 g/dL (ref 3.5–5.0)
ALT: 13 U/L (ref 0–55)
ANION GAP: 9 meq/L (ref 3–11)
AST: 18 U/L (ref 5–34)
Alkaline Phosphatase: 88 U/L (ref 40–150)
BILIRUBIN TOTAL: 0.5 mg/dL (ref 0.20–1.20)
BUN: 20.4 mg/dL (ref 7.0–26.0)
CALCIUM: 9.8 mg/dL (ref 8.4–10.4)
CHLORIDE: 101 meq/L (ref 98–109)
CO2: 30 mEq/L — ABNORMAL HIGH (ref 22–29)
Creatinine: 1.6 mg/dL — ABNORMAL HIGH (ref 0.7–1.3)
EGFR: 42 mL/min/{1.73_m2} — ABNORMAL LOW (ref >90)
Glucose: 98 mg/dl (ref 70–140)
Potassium: 4.4 mEq/L (ref 3.5–5.1)
Sodium: 141 mEq/L (ref 136–145)
TOTAL PROTEIN: 8 g/dL (ref 6.4–8.3)

## 2016-10-29 LAB — CBC WITH DIFFERENTIAL/PLATELET
BASO%: 0.1 % (ref 0.0–2.0)
Basophils Absolute: 0 10*3/uL (ref 0.0–0.1)
EOS ABS: 0.1 10*3/uL (ref 0.0–0.5)
EOS%: 0.6 % (ref 0.0–7.0)
HCT: 37.9 % — ABNORMAL LOW (ref 38.4–49.9)
HGB: 11.9 g/dL — ABNORMAL LOW (ref 13.0–17.1)
LYMPH%: 9.6 % — AB (ref 14.0–49.0)
MCH: 28.1 pg (ref 27.2–33.4)
MCHC: 31.4 g/dL — ABNORMAL LOW (ref 32.0–36.0)
MCV: 89.4 fL (ref 79.3–98.0)
MONO#: 0.7 10*3/uL (ref 0.1–0.9)
MONO%: 6.7 % (ref 0.0–14.0)
NEUT%: 83 % — ABNORMAL HIGH (ref 39.0–75.0)
NEUTROS ABS: 8.4 10*3/uL — AB (ref 1.5–6.5)
Platelets: 171 10*3/uL (ref 140–400)
RBC: 4.24 10*6/uL (ref 4.20–5.82)
RDW: 14.8 % — AB (ref 11.0–14.6)
WBC: 10.1 10*3/uL (ref 4.0–10.3)
lymph#: 1 10*3/uL (ref 0.9–3.3)

## 2016-10-29 NOTE — Progress Notes (Unsigned)
Corrected report of Calcium 9.8 instead of - 9.8 called to Diane RN/Dr. Earlie Server by Wynona Canes 10-29-2016

## 2016-10-30 ENCOUNTER — Ambulatory Visit (HOSPITAL_COMMUNITY)
Admission: RE | Admit: 2016-10-30 | Discharge: 2016-10-30 | Disposition: A | Discharge status: Home / Self Care | Payer: Medicare Other | Source: Ambulatory Visit | Attending: Internal Medicine | Admitting: Internal Medicine

## 2016-10-30 ENCOUNTER — Encounter (HOSPITAL_COMMUNITY): Payer: Self-pay

## 2016-10-30 DIAGNOSIS — I700 Atherosclerosis of aorta: Secondary | ICD-10-CM | POA: Insufficient documentation

## 2016-10-30 DIAGNOSIS — C3411 Malignant neoplasm of upper lobe, right bronchus or lung: Secondary | ICD-10-CM

## 2016-10-30 DIAGNOSIS — J90 Pleural effusion, not elsewhere classified: Secondary | ICD-10-CM | POA: Diagnosis not present

## 2016-10-30 DIAGNOSIS — C3490 Malignant neoplasm of unspecified part of unspecified bronchus or lung: Secondary | ICD-10-CM | POA: Diagnosis not present

## 2016-10-30 MED ORDER — IOPAMIDOL (ISOVUE-300) INJECTION 61%
INTRAVENOUS | Status: AC
  Filled 2016-10-30: qty 75

## 2016-10-30 MED ORDER — IOPAMIDOL (ISOVUE-300) INJECTION 61%
75.0000 mL | Freq: Once | INTRAVENOUS | Status: AC | PRN
  Administered 2016-10-30: 60 mL via INTRAVENOUS

## 2016-11-05 ENCOUNTER — Ambulatory Visit (INDEPENDENT_AMBULATORY_CARE_PROVIDER_SITE_OTHER): Payer: Medicare Other | Admitting: Pulmonary Disease

## 2016-11-05 VITALS — BP 118/64 | HR 78 | Temp 97.2°F | Ht 70.0 in | Wt 189.2 lb

## 2016-11-05 DIAGNOSIS — C3411 Malignant neoplasm of upper lobe, right bronchus or lung: Secondary | ICD-10-CM

## 2016-11-05 DIAGNOSIS — J449 Chronic obstructive pulmonary disease, unspecified: Secondary | ICD-10-CM

## 2016-11-05 DIAGNOSIS — Z951 Presence of aortocoronary bypass graft: Secondary | ICD-10-CM | POA: Diagnosis not present

## 2016-11-05 DIAGNOSIS — I255 Ischemic cardiomyopathy: Secondary | ICD-10-CM | POA: Diagnosis not present

## 2016-11-05 DIAGNOSIS — I679 Cerebrovascular disease, unspecified: Secondary | ICD-10-CM

## 2016-11-05 DIAGNOSIS — N183 Chronic kidney disease, stage 3 unspecified: Secondary | ICD-10-CM

## 2016-11-05 DIAGNOSIS — I5042 Chronic combined systolic (congestive) and diastolic (congestive) heart failure: Secondary | ICD-10-CM

## 2016-11-05 DIAGNOSIS — I2510 Atherosclerotic heart disease of native coronary artery without angina pectoris: Secondary | ICD-10-CM | POA: Diagnosis not present

## 2016-11-05 DIAGNOSIS — I714 Abdominal aortic aneurysm, without rupture, unspecified: Secondary | ICD-10-CM

## 2016-11-05 NOTE — Patient Instructions (Signed)
Today we updated your med list in our EPIC system...    Continue your current medications the same...  Continue your SYMBICORT twice daily & Albuterol rescue inhaler as needed...  Continue your active exercise program...  Call for any questions...  Let's plan a follow up visit in 3-53mo sooner if needed for breathing problems..Marland KitchenMarland Kitchen

## 2016-11-06 ENCOUNTER — Encounter: Payer: Self-pay | Admitting: Pulmonary Disease

## 2016-11-06 NOTE — Progress Notes (Addendum)
Subjective:     Patient ID: Paul Sandoval, male   DOB: [birth year], 76 y.o.   MRN: [id number]  HPI  ~  February 29, 2016:  [place] w/ SN>  Cosby is a 76 y/o WF w/ mult medical problems as noted below who has seen DrAlva & DrMannam previously;  I have reviewed his extensive epic records and recent [place] x2 in June2017 and formulated the following PROBLEM LIST>>  His PCP is DrLuking in [place]... He reminded me that I cared for his dad in 71...    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    ~  HC[place]onx2 in 01/2016>  Fever, cough, increased RUL opac (PCT<0.10, he received 10d Levaquin) & right>left effusion w/ thoracentesis 02/22/16 removing 1200cc clear yellow fluid, prob transudate, & BNP was >900; meds adjusted & he was diuresed w/ improvement but 2DEcho revealed worsening CHF w/ EF ~30-35% + HK & AK => he has f/u pending w/ Cards...    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    ASPVD- s/p AAA stent graft 11/2012, mod bilat carotid occlusive dis (asymptomatic) w/ CDopplers showing ~50-60% bilat ICAstenoses>  [facility] yearly f/u CT Abd and CDopplers    MEDICAL issues>  HBP, HL, GERD/ Divertic/ Polyps, CKD-stage3, Anemia & thrombocytopenia>  On Simva80-1/2Qd, Prilosec20/d, Fe/ MVI/ etc... Since he was disch 02/25/16>  He reports feeling better, breathing better, still sl SOB w/ activ but not requiring O2, mild cough w/ beige sput, no hemoptysis, denies CP=> he is due for f/u CXR & blood work post hosp... EXAM shows Afeb, VSS, O2sat=95% on RA after walking back; Wt=180#;  HEENT- neg, mallampati2;  Chest- sl decr BS right base, few rhonchi, w/o w/r/consolidation;  Heart- RR Gr1/6SEM w/o r/g;  Abd- soft, neg;  Ext- neg w/o c/c/e...  PFT 02/01/16>  FVC=1.96 (45%), FEV1=1.25 (40%), %1sec=64, mid-flows were reduced at 28% predicted; post bronchodil there was a 31% improvement in FEV1 to 1.64;  TLC=5.96 (83%, RV=3.89 (  149%), RV/TLC=65%;  DLCO=58% pred.  This is c/w moderate to severe airflow obstruction, GOLD Stage 3 COPD w/ a signif asthmatic component, air trapping, and decr diffusion capacity...  2DEcho 02/22/16>  Mild conc LVH w/ decr LVF & EF=30-35% w/ diffuse HK & AK in several walls (see report), Gr2 DD, mild MR otherw norm valves, mild LAdil (34m), norm RV function & PA pressures  LABS 01/2016 Hosp>  Chems- ok x Cr=1.3-1.4, BS=120-140, Alb=2.8;  CBC- anemic w/ Hg=8-9 range, Plat~130K range;  TSH=0.92  CT Chest 02/21/16>  Norm heart size, extensive coronary calcif, s/p CABG, decr size of the pathological  right paratracheal LN, decr size of RUL mass (now ~2cm & prev ~4cm), new confluent airsp opac w/ air bronchograms in RUL w/ large right effusion; s/p GB, +HH, Abd Ao stent graft, DJD & DISH w/o metastatic lesions  CT Angio Chest 02/24/16>  NEG for PE, s/pCABG & stent in Lmain, norm heart size, no pericard fluid, small right pleural effusion, architectural distortion & interstitial opacities in RUL c/w XRT changes, RUL nodule measures ~2cm, no adenopathy reported...   CXR 02/29/16>  Stable heart size, tortuous Ao, s/p CABG, sl improvement in interstitial opac in RUL area & posteromedial RUL nodule  LABS 02/29/16>  Chems- ok x HCO3=36, BS=120, BUN/Cr=30/1.40;  CBC- Hg=10.9, Plat=249K, WBC=18.2 (on Pred);  BNP=1053;  Sed=53 IMP/PLAN>>  Problem list as above w/ signif cardiac, pulmonary, & post chemoradiation changes;  From the pulm standpoint- he feels the BREO is helping & hasn't needed the rescue inhaler very often, continue same;  He is on a long PRED taper per DrManning for poss radiation fibrosis RUL, Sed rate is 53, continue this taper & careful w/ sweets/ sugar etc;  From the cardiac standpoint- he has signif underlying dis w/ Lmain stent, s/p CABG 20 yrs ago, prob ischemic cardiomyopathy w/ worsening 2DEcho recently w/ 30-35% EF + HK&AK seen, BNP=1053, Cards plans ischemic work up when able, in the meanwhile he has improved w/ diuresis but Chems w/ mild RI & HCO3=36... REC- add DIAMOX-ER 500 one tab daily at 4pm, continue his Lasix40 Qam, increase water intake, watch weights... We plan ROV 173monthecheck...   ~  April 04, 2016:  80m780moV w/ SN>> BilBrantleylled the Oncology symptom management clinic 8/4 w/ intermittent cough, wheezing, fever; they did a f/u CXR interpreted as showing a right base opac suspicious for acute pneumonia but this is a soft finding on my review of the XRay, and the prev RUL reticulonod opac is improved (radiation fibrosis improved w/ Pred taper); placed on Levaquin Rx;  He denies  sput production, hemoptysis, CP, or SOB but he is fairly sedentary.    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    ~  HNelsoniax2 in 01/2016>  Fever, cough, increased RUL opac (PCT<0.10, he received 10d Levaquin) & right>left effusion w/ thoracentesis 02/22/16 removing 1200cc clear yellow fluid, prob transudate, & BNP was >900; meds adjusted & he was diuresed w/ improvement but 2DEcho revealed worsening CHF w/ EF ~30-35% + HK & AK => he has f/u pending w/ Cards...    ~  [facility]  He had right pleural effusion tapped 02/22/16=> Prob TRANSUDATE w/ TProt<3.0, LDH=95, Cytology=NEG (reactive mesothelial cells only);  Cults=> no growth    ~  03/2016 developed cough/ wheezing/ low grade fever=> CXR w/ improved RUL, ?incr markings R base, he was off the Pred, given more Levaquin...     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    ~  He saw CARDS PA 03/01/16> no change in meds, Lexiscan Myoview 03/06/16 showed hi risk study w/ EF=30%, no ST segm changes, c/w antero-apical MI & no evid reversible ischemia...    ASPVD- s/p AAA stent graft 11/2012, mod bilat carotid occlusive dis (asymptomatic) w/ CDopplers showing ~50-60% bilat ICAstenoses>  [facility] plans yearly f/u CT Abd and CDopplers    MEDICAL issues>  HBP, HL, GERD/ Divertic/ Polyps, CKD-stage3, Anemia & thrombocytopenia>  On Simva80-1/2Qd, Prilosec20/d, Fe/ MVI/ etc... EXAM shows Afeb, VSS, O2sat=99% on RA; Wt=178#;  HEENT- neg, mallampati2;  Chest- sl decr BS right base, few rhonchi, w/o w/r/consolidation;  Heart- RR Gr1/6SEM w/o r/g;  Abd- soft, neg;  Ext- neg w/o c/c/e...  Lexiscan Myoview 03/06/16 showed hi risk study w/ EF=30%, no ST segm changes, c/w antero-apical MI & no evid reversible ischemia...  CXR 03/30/16>  incr patchy opac at the right base, no effusion, RUL reticulonodular opac in RUL has regressed; norm heart size s/p CABG, abd Ao endograft part vis  LABS 03/28/16>  Chems- ok w/ K=4.0, HCO3=23, Cr=1.6, BS=145;  CBC- ok w/ Hg=11.4, wbc=6.1, plat=85K;   IMP/PLAN>>  Discussed w/ Kean-- continue the Levaquin til gone, use Tylenol for fever or pain; continue the Breo daily & Ventolin-HFA as needed; wean off the Diamox at this time, continue Lasix40, no salt, etc; he needs to gradually increase his exercise/ mobility; we will plan ROV w/ labs in 273mo.   ~  June 06, 2016:  78m10moV w/ SN>  BilQuandariusports that he is feeling better, gaining strength, walking 78mi69m mowing yard, and resting well at night;  He notes min hacking cough, no sput- no discoloration or blood, SOB is diminished & he denies CP, no f/c/s... He saw DrMohamed 05/16/16- pt remains on observation (s/p chemoradiation, 5 cycles w/ partial  response); repeat CT Chest 05/10/16 showed more confluent airsp dis in RUL w/ vol loss, abn soft tissue attenuation in right hilum has progressed (?felt to be treatment related), stable subcarinal LN ~11mm54mderate right effusion... DrMohamed felt this scan showed no concerning features of dis progression & they opted for  continued observ w/ f/u 49mo.. 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    ~  HoAcampo2 in 01/2016>  Fever, cough, increased RUL opac (PCT<0.10, he received 10d Levaquin) & right>left effusion w/ thoracentesis 02/22/16 removing 1200cc clear yellow fluid, prob transudate, & BNP was >900; meds adjusted & he was diuresed w/ improvement but 2DEcho revealed worsening CHF w/ EF ~30-35% + HK & AK => he has f/u pending w/ Cards...    ~  [facility]  He had right pleural effusion tapped 02/22/16=> Prob TRANSUDATE w/ TProt<3.0, LDH=95, Cytology=NEG (reactive mesothelial cells only);  Cults=> no growth    ~  03/2016 developed cough/ wheezing/ low grade fever=> CXR w/ improved RUL, ?incr markings R base,  he was off the Pred, given more Levaquin...     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    ~  He saw CARDS PA 03/01/16> no change in meds, Lexiscan Myoview 03/06/16 showed hi risk study w/ EF=30%, no ST segm changes, c/w antero-apical MI & no evid reversible ischemia...    ASPVD- s/p AAA stent graft 11/2012, mod bilat carotid occlusive dis (asymptomatic) w/ CDopplers showing ~50-60% bilat ICAstenoses>  [facility]- plans yearly f/u CT Abd and CDopplers    MEDICAL issues>  HBP, HL, GERD/ Divertic/ Polyps, CKD-stage3, Anemia & thrombocytopenia>  On Simva80-1/2Qd, Prilosec20/d, Fe/ MVI etc... EXAM shows Afeb, VSS, O2sat=95% on RA; Wt=183# (up 5#);  HEENT- neg, mallampati2;  Chest- sl decr BS right base, few rhonchi, w/o w/r/consolidation;  Heart- RR Gr1/6SEM w/o r/g;  Abd- soft, neg;  Ext- neg w/o c/c/e...  CT Chest 05/10/16 showed more confluent airsp dis in RUL w/ vol loss, abn soft tissue attenuation in right hilum has progressed (?felt to be treatment related), stable subcarinal LN ~1150mmoderate right effusion.  CXR 06/06/16> progressive incr density in RUL & vol loss, soft tissue fullness in right hilum, s/p CABG and calcif in wall of Ao arch  PET scan 06/15/16> persistent but diminished hypermetabolism in the RUL area of interstitial &  airsp opac- likely related to XRT, no hypermetabolism in the prev R paratrachial LN, similar sized subcarinal LN w/ low level hypermetabolism noted; new area of LUL anterior  interstitial & airsp dis w/ some bronchiectasis shows low level hypermetabolism (?radiation change?); moderate right effusion...  IMP/PLAN>>  He is feeling better, performance status improving, CXR shows progressive changes in RUL s/p chemoradiation; CT=>PET scans w/ vol loss & persistent hypermetabolism/ evolving changes in RUL & right hilum plus he has a mod large right effusion; prev thoracentesis 01/2016 in hosp was transudative- I believe he would benefit from further eval/ repeat thoracentesis to recheck this fluid; we will set this up via IR & ask them to drain the fluid as much as poss, send it for Cytology, cell ct & diff, TProt/ LDH/ Gluc...   ~  August 06, 2016:  11moROV w/ SN>  BShivankreports that his SOB & energy have improved, appetite is better; he is more active walking 218m5d/wk + yard work/ mowing/ leaves/ etc; he notes min cough, small amt beige sput, stable DOE, no CP or edema... He tells me that DrMohamed has CT Chest & LABS ordered for next week so we will wait for these tests & review when avail...     COPD (Stage3 w/ signif revers component), former smoker (quit 1996 w/ ~40+ pack-yr hx)>  On Symbicort160-2spBid regularly ([facility] changed Rx), VentolinHFA rescue inhaler as needed...    He saw DrCherly Hensenor CARDS 07/04/16>  HBP, CAD- s/p CABG 1996 & subseq PCI to graft vessels, chr combined sys&diast CHF w/ cardiomyopathy, PVD- w/ carotid dis & Ao stent graft per drLawson, HL, CKD;  No changes made to his ASA/ Plavix, Metoprolol50-1.5Bid, Lasix40, Simva40...     EXAM shows Afeb, VSS, O2sat=98% on RA; Wt=187# (up 4#);  HEENT- neg, mallampati2;  Chest- sl decr BS right base, few rhonchi, w/o w/r/consolidation;  Heart- RR Gr1/6SEM w/o r/g;  Abd- soft, neg;  Ext- neg w/o c/c/e...  Throacentesis 06/22/17 by IR>  1.2L removed (hazy yellow fluid)=> Cell ct=923 cells w/ 2 Neutro/ 58 lymph/ 40 mono-macrophages;  Chems- LDH=119, TPro=4.1, Gluc=101;  Cyto= Atypic cells c/w reactive mesothel  cells.  Post thoracentesis CXR 06/22/17>  decr right effusion, no pneumoth, dense consolid RUL unchanged, post-op BABG...  CT Chest 08/14/16>  Norm heart size- s/p CABG & Ao atherosclerotic changes; stable upper lim adenopathy; decr right effusion- now small in size; similar interstitial & airsp dis in right perihilar & medial LUL is c/w radiation fibrosis; no signif pulm nodules or masses- no new or progressive dis...   LABS 08/14/17>  Chems- ok x Cr=1.6;  CBC- ok w/ Hg=12.7...Marland KitchenMarland KitchenMP/PLAN>>  BiJoans clinically stable, no clear evid of recurrent dis & followed very closely by DrMohamed; Pulm stable on Symbicort Bid, Albut rescue prn, and his exercise- continue same & we plan rov recheck in 102m48mo   ~  November 05, 2016:  102mo15mo & [place] indicates "stronger, better" on diet & exercise program w/ good energy (eg- climbed steps at a ball game);  He denies much cough (clear throat), small amt clear sput, no blood, SOB w/ exertion only- ADLs ok & DOE stable, no CP & he describes 2mi 42mk 5d/wk & he stays busy...  [facility] (sl incr subcarinal LN, scarring & radiation fibrosis, ?sl incr right effusion)... 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    ~  HParadisex2 in 01/2016>  Fever, cough, increased RUL opac (PCT<0.10, he received 10d Levaquin) & right>left effusion w/ thoracentesis 02/22/16 removing 1200cc clear yellow fluid, prob transudate, & BNP was >900; meds adjusted & he was diuresed w/ improvement but 2DEcho revealed worsening CHF w/ EF ~30-35% + HK & AK => he has f/u pending w/ Cards...    ~  [facility]  He had right pleural effusion tapped 02/22/16=> Prob TRANSUDATE w/ TProt<3.0, LDH=95, Cytology=NEG (reactive mesothelial cells only);  Cults=> no growth    ~  03/2016 developed cough/ wheezing/ low grade fever=> CXR w/ improved RUL, ?incr markings R base, he was off the Pred, given more Levaquin...     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    ~  He saw CARDS PA 03/01/16> no change in meds, Lexiscan Myoview 03/06/16 showed hi risk study w/ EF=30%, no ST segm changes, c/w antero-apical MI & no evid reversible ischemia...    ASPVD- s/p AAA stent graft 11/2012, mod bilat carotid occlusive dis (asymptomatic) w/ CDopplers showing ~50-60% bilat ICAstenoses>  [facility] plans yearly f/u CT Abd and CDopplers    MEDICAL issues>  HBP, HL, GERD/ Divertic/ Polyps, CKD-stage3 (cr=1.6), Anemia (Hg=11.9) & thrombocytopenia (plat=171K)>  On Simva80-1/2Qd, Prilosec20/d, Fe/ MVI etc... EXAM shows Afeb, VSS, O2sat=98% on RA; Wt=189# (up  2#);  HEENT- neg, mallampati2;  Chest- sl decr BS right base, few rhonchi, w/o w/r/consolidation;  Heart- RR Gr1/6SEM w/o r/g;  Abd- soft, neg;  Ext- neg w/o c/c/e...  CT Chest 10/30/16>  Norm heart size, s/p CABG, atherosclerosis in Ao; prev 149msubcarinal LN now measures 135msl incr in right pleural effusion, radiation fibrosis & changes in medial right lung and ant LULare stable/ unchanged;   LABS 10/29/16>  Chems- wnl w/ Cr=1.6, LFTs= wnl;  CBC- ok w/ Hg=11.9, WBC=10.1  Ambulatory Oximetry 11/05/16>  O2sat=96% on RA at rest w/ pulse=81/min;  He ambulated 3 laps in office (185'each) w/ lowest O2sat=90% w/ pulse 99/min... IMP/PLAN>>  BilKalani doing satis on Symbicort160-2spBid & Albut rescue inhaler prn; he states that he is active, energy is good, &  that he's getting stronger & better (good performance status); I suspect that he has residual cancer in the right chest but it appears slowly progressive (w/ sl incr right effusion & subcarinal LN) and DrMohamed to review status & decide regarding additional therapy at this point vs continued observation... we plan rov recheck in 3-47mo  NOTE:  >50% of this 25 min rov was spent in counseling & coordination of care...   Past Medical History:  Diagnosis Date  . AAA (abdominal aortic aneurysm) ([place] 2010   4.4 cm 08/2008;4.44 in 7/10 and 4.65 in 08/2009; 4.8 by CT in 11/2009; 4.3 by ultrasound in 08/2010  . Arteriosclerotic cardiovascular disease (ASCVD) 1996   CABG-1996  . Arthritis    "fingers" (03/18/2014)  . CAD (coronary artery disease)    03/18/14:  PCI with DES to distal left main. 7/29: DES to the SVG to Diag  . Cancer ([place]    Upper right lobe lung cancer  . Cardiomyopathy, ischemic    Echo 03/17/14: EF 45-50%  . Chronic bronchitis ([place]   . Chronic rhinitis   . Colonic polyp 2002   polypectomy in 2002  . COPD (chronic obstructive pulmonary disease) ([place]   . Diverticulosis   . ED (erectile dysfunction)   . Encounter for antineoplastic  chemotherapy 12/19/2015  . GERD (gastroesophageal reflux disease)   . Hyperlipidemia   . Hypertension   . IFG (impaired fasting glucose)   . Myocardial infarction    "told h/o silent MI sometime before 1996"  . Pneumonia ~ 2001; ~ 2005  . Right bundle branch block   . Tobacco abuse, in remission    40 pack year total consumption; discontinued in 1996    Past Surgical History:  Procedure Laterality Date  . ABDOMINAL AORTIC ANEURYSM REPAIR  11/2012  . ABDOMINAL AORTIC ENDOVASCULAR STENT GRAFT N/A 12/11/2012   Procedure: ABDOMINAL AORTIC ENDOVASCULAR STENT GRAFT;  Surgeon: JMal Misty MD;  [place]  Service: Vascular;  Laterality: N/A;  Ultrasound guided; [place]  . CARDIAC CATHETERIZATION  01/08/1995  . COLONOSCOPY  2002   polypectomy-patient denies  . CORONARY ANGIOPLASTY WITH STENT PLACEMENT  03/18/2014   "1"  . CORONARY ANGIOPLASTY WITH STENT PLACEMENT  03/24/2014   "1"  . CORONARY ARTERY BYPASS GRAFT  01/09/1995   "CABG X3"  . JOINT REPLACEMENT    . LAPAROSCOPIC CHOLECYSTECTOMY  12/2009  . LEFT AND RIGHT HEART CATHETERIZATION WITH CORONARY/GRAFT ANGIOGRAM N/A 03/18/2014   Procedure: LEFT AND RIGHT HEART CATHETERIZATION WITH CBeatrix Fetters  Surgeon: MBlane Ohara MD;  [facility];  Service: Cardiovascular;  Laterality: N/A;  . PERCUTANEOUS CORONARY STENT INTERVENTION (PCI-S)  03/18/2014   Procedure: PERCUTANEOUS CORONARY STENT INTERVENTION (PCI-S);  Surgeon: MBlane Ohara MD;  [facility];  Service: Cardiovascular;;  . PERCUTANEOUS CORONARY STENT INTERVENTION (PCI-S) N/A 03/24/2014   Procedure: PERCUTANEOUS CORONARY STENT INTERVENTION (PCI-S);  Surgeon: MBlane Ohara MD;  [facility];  Service: Cardiovascular;  Laterality: N/A;  . TOTAL HIP ARTHROPLASTY Left 01/21/2013   Procedure: TOTAL HIP ARTHROPLASTY ANTERIOR APPROACH;  Surgeon: MMauri Pole MD;  [place]  Service: Orthopedics;  Laterality: Left;  .Marland KitchenVIDEO  BRONCHOSCOPY N/A 11/17/2015   Procedure: VIDEO BRONCHOSCOPY WITH FLUORO;  Surgeon: RRigoberto Noel MD;  [place] [facility]  Service: Cardiopulmonary;  Laterality: N/A;    Outpatient Encounter Prescriptions as of 11/05/2016  Medication Sig  . acetaminophen (TYLENOL) 500 MG tablet Take 500 mg by mouth every 6 (  six) hours as needed for mild pain.  Marland Kitchen aspirin EC 81 MG tablet Take 81 mg by mouth daily.  . budesonide-formoterol (SYMBICORT) 160-4.5 MCG/ACT inhaler Inhale 2 puffs into the lungs 2 (two) times daily.  . clopidogrel (PLAVIX) 75 MG tablet Take 1 tablet (75 mg total) by mouth daily. Reported on 12/02/2015  . Ferrous Sulfate (IRON) 28 MG TABS Take 28 mg by mouth daily.  . furosemide (LASIX) 40 MG tablet Take 1 tablet (40 mg total) by mouth daily.  . Metoprolol Tartrate 75 MG TABS Take 1 tablet by mouth 2 (two) times daily.  . Multiple Vitamins-Minerals (CENTRUM SILVER ADULT 50+) TABS Take 1 tablet by mouth daily.   . nitroGLYCERIN (NITROSTAT) 0.4 MG SL tablet Place 1 tablet (0.4 mg total) under the tongue every 5 (five) minutes as needed for chest pain.  Marland Kitchen omeprazole (PRILOSEC) 20 MG capsule TAKE ONE CAPSULE BY MOUTH DAILY.  . simvastatin (ZOCOR) 80 MG tablet Take 0.5 tablets (40 mg total) by mouth at bedtime.  . VENTOLIN HFA 108 (90 Base) MCG/ACT inhaler INHALE 2 PUFFS BY MOUTH EVERY 4 TO 6 HOURS AS NEEDED FOR WHEEZING.   No facility-administered encounter medications on file as of 11/05/2016.     Allergies  Allergen Reactions  . Neomycin Hives    Immunization History  Administered Date(s) Administered  . H1N1 08/06/2008  . Influenza Split 06/24/2013  . Influenza,inj,Quad PF,36+ Mos 06/15/2014, 05/31/2015, 05/09/2016  . Influenza-Unspecified 05/27/2012  . Pneumococcal Conjugate-13 08/18/2014  . Pneumococcal Polysaccharide-23 03/27/2012  . Zoster 07/27/2008    Current Medications, Allergies, Past Medical History, Past Surgical History, Family History, and Social History were  reviewed in [employer] record.   Review of Systems             All symptoms NEG except where BOLDED >>  Constitutional:  F/C/S, fatigue, anorexia, unexpected weight change. HEENT:  HA, visual changes, hearing loss, earache, nasal symptoms, sore throat, mouth sores, hoarseness. Resp:  cough, sputum, hemoptysis; SOB, tightness, wheezing. Cardio:  CP, palpit, DOE, orthopnea, edema. GI:  N/V/D/C, blood in stool; reflux, abd pain, distention, gas. GU:  dysuria, freq, urgency, hematuria, flank pain, voiding difficulty. MS:  joint pain, swelling, tenderness, decr ROM; neck pain, back pain, etc. Neuro:  HA, tremors, seizures, dizziness, syncope, weakness, numbness, gait abn. Skin:  suspicious lesions or skin rash. Heme:  adenopathy, bruising, bleeding. Psyche:  confusion, agitation, sleep disturbance, hallucinations, anxiety, depression suicidal.   Objective:   Physical Exam       Vital Signs:  Reviewed...   General:  WD, WN, 76 y/o WM in NAD; alert & oriented; pleasant & cooperative... HEENT:  [place]/AT; Conjunctiva- pink, Sclera- nonicteric, EOM-wnl, PERRLA, EACs-clear, TMs-wnl; NOSE-clear; THROAT-clear & wnl.  Neck:  Supple w/ fair ROM; no JVD; normal carotid impulses w/ faint bruits; no thyromegaly or nodules palpated; no lymphadenopathy.  Chest:  decr BS right base & clear w/o w/r/r & no signs of consolid. Heart:  Regular Rhythm; norm S1 & S2 w/ Gr1/6 SEM without rubs or gallops detected. Abdomen:  Soft & nontender- no guarding or rebound; normal bowel sounds; no organomegaly or masses palpated. Ext:  Sl decr ROM; without deformities +arthritic changes; no varicose veins, +venous insuffic, tr edema;  Pulses decr w/o bruits. Neuro:  CNs II-XII intact; motor testing normal; sensory testing normal; gait normal & balance OK. Derm:  No lesions noted; no rash etc. Lymph:  No cervical, supraclavicular, axillary, or inguinal adenopathy palpated.   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     ASPVD- s/p AAA stent graft 11/2012, mod bilat carotid occlusive dis (asymptomatic) w/ CDopplers showing ~50-60% bilat ICAstenoses>  Stable & seen by [facility] 01/2016- plans yearly f/u CT Abd and CDopplers    MEDICAL issues>  HBP, HL, GERD/ Divertic/ Polyps, CKD-stage3, Anemia & thrombocytopenia>  On Simva80-1/2Qd, Prilosec20/d, Fe/ MVI/ etc...  PLAN>>  02/29/16>   Problem list as above w/ signif cardiac, pulmonary, & post chemoradiation changes;  From the pulm standpoint- he feels the BREO is helping & hasn't needed the rescue inhaler very often, continue same;  He is on a long PRED taper per DrManning for poss radiation fibrosis RUL, Sed rate is 53, continue this taper & careful w/ sweets/ sugar etc;  From the cardiac standpoint- he has signif underlying dis w/ Lmain stent, s/p CABG 20 yrs ago, prob ischemic cardiomyopathy w/ worsening 2DEcho recently w/ 30-35% EF + HK&AK seen, BNP=1053, Cards plans ischemic work up when able, in the meanwhile he has improved w/ diuresis but Chems w/ mild RI & HCO3=36... REC- add DIAMOX-ER 500 one tab daily at 4pm, continue his Lasix40 Qam, increase water intake, watch weights... We plan ROV 28monthrecheck... 04/04/16>   Discussed w/ Bo-- continue the Levaquin til gone, use Tylenol for fever or pain; continue the Breo daily & Ventolin-HFA as  needed; wean off the Diamox at this time, continue Lasix40, no salt, etc; he needs to gradually increase his exercise/ mobility; we will plan ROV w/ labs in 226mo 06/06/16>   He is feeling better, performance status improving, CXR shows progressive changes in RUL s/p chemoradiation; CT=>PET scans w/ vol loss & persistent hypermetabolism/ evolving changes in RUL & right hilum plus he has a mod large right effusion; prev thoracentesis 01/2016 in hosp was transudative- I believe he would benefit from further eval/ repeat thoracentesis to recheck this fluid; we will set this up via IR & ask them to drain the fluid as much as poss, send it for Cytology, cell ct & diff, TProt/ LDH/ Gluc => 1.2L removed, prob exudate, NEG cytology, & we will follow... 08/06/16>   BiFilberts clinically stable, no clear evid of recurrent dis & followed very closely by DrMohamed; Pulm stable on Symbicort Bid, Albut rescue prn, and his exercise- continue same 7 we plan rov recheck in [id number]/18>    BilJorge doing satis on Symbicort160-2spBid & Albut rescue inhaler prn; he states that he is active, energy is good, & that he's getting stronger & better (good performance status); I suspect that he has residual cancer in the right chest but it appears slowly progressive (w/ sl incr right effusion & subcarinal LN) and DrMohamed to review status & decide regarding additional therapy at this point vs continued observation...      Plan:     Patient's Medications  New Prescriptions  No medications on file  Previous Medications   ACETAMINOPHEN (TYLENOL) 500 MG TABLET    Take 500 mg by mouth every 6 (six) hours as needed for mild pain.   ASPIRIN EC 81 MG TABLET    Take 81 mg by mouth daily.   BUDESONIDE-FORMOTEROL (SYMBICORT) 160-4.5 MCG/ACT INHALER    Inhale 2 puffs into the lungs 2 (two) times daily.   CLOPIDOGREL (PLAVIX) 75 MG TABLET    Take 1 tablet (75 mg total) by mouth daily. Reported on 12/02/2015   FERROUS SULFATE (IRON) 28 MG TABS     Take 28 mg by mouth daily.   FUROSEMIDE (LASIX) 40 MG TABLET    Take 1 tablet (40 mg total) by mouth daily.   METOPROLOL TARTRATE 75 MG TABS    Take 1 tablet by mouth 2 (two) times daily.   MULTIPLE VITAMINS-MINERALS (CENTRUM SILVER ADULT 50+) TABS    Take 1 tablet by mouth daily.    NITROGLYCERIN (NITROSTAT) 0.4 MG SL TABLET    Place 1 tablet (0.4 mg total) under the tongue every 5 (five) minutes as needed for chest pain.   OMEPRAZOLE (PRILOSEC) 20 MG CAPSULE    TAKE ONE CAPSULE BY MOUTH DAILY.   SIMVASTATIN (ZOCOR) 80 MG TABLET    Take 0.5 tablets (40 mg total) by mouth at bedtime.   VENTOLIN HFA 108 (90 BASE) MCG/ACT INHALER    INHALE 2 PUFFS BY MOUTH EVERY 4 TO 6 HOURS AS NEEDED FOR WHEEZING.  Modified Medications   No medications on file  Discontinued Medications   No medications on file

## 2016-11-12 ENCOUNTER — Other Ambulatory Visit: Payer: Medicare Other

## 2016-11-13 ENCOUNTER — Telehealth: Payer: Self-pay | Admitting: Family Medicine

## 2016-11-13 NOTE — Telephone Encounter (Signed)
Pt dropped off a copy of his lab results. Please see in blue folder in office.

## 2016-11-15 ENCOUNTER — Telehealth: Payer: Self-pay | Admitting: Internal Medicine

## 2016-11-15 ENCOUNTER — Encounter: Payer: Self-pay | Admitting: Internal Medicine

## 2016-11-15 ENCOUNTER — Ambulatory Visit (HOSPITAL_BASED_OUTPATIENT_CLINIC_OR_DEPARTMENT_OTHER): Payer: Medicare Other | Admitting: Internal Medicine

## 2016-11-15 VITALS — BP 129/64 | HR 76 | Temp 97.7°F | Resp 18 | Ht 70.0 in | Wt 188.0 lb

## 2016-11-15 DIAGNOSIS — C3411 Malignant neoplasm of upper lobe, right bronchus or lung: Secondary | ICD-10-CM

## 2016-11-15 NOTE — Telephone Encounter (Signed)
Gave patient avs report and appointments for June. Central radiology will call re scan.  °

## 2016-11-15 NOTE — Progress Notes (Signed)
[place] [phone]   [phone]  OFFICE PROGRESS NOTE  [name], MD [place] [address]  DIAGNOSIS: Stage IIIA (T2b, N2, M0) non-small cell lung cancer, favoring squamous cell carcinoma presented with right upper lobe lung mass in addition to mediastinal lymphadenopathy diagnosed in March 2017.  PRIOR THERAPY:  A course of concurrent chemoradiation with weekly carboplatin for AUC of 2 and paclitaxel 45 MG/M2. Status post 5 cycle with partial response.  CURRENT THERAPY: Observation  INTERVAL HISTORY: Paul Sandoval 76 y.o. male returns to the clinic today for three-month follow-up visit. The patient is feeling fine today with no specific complaints. He plays golf at regular basis. He denied having any chest pain, shortness breath, cough or hemoptysis. He denied having any recent weight loss or night sweats. He has no nausea or vomiting, no fever or chills. He had repeat CT scan of the chest performed recently and he is here for evaluation and discussion of his scan results.   MEDICAL HISTORY: Past Medical History:  Diagnosis Date  . AAA (abdominal aortic aneurysm) ([place]) 2010   4.4 cm 08/2008;4.44 in 7/10 and 4.65 in 08/2009; 4.8 by CT in 11/2009; 4.3 by ultrasound in 08/2010  . Arteriosclerotic cardiovascular disease (ASCVD) 1996   CABG-1996  . Arthritis    "fingers" (03/18/2014)  . CAD (coronary artery disease)    03/18/14:  PCI with DES to distal left main. 7/29: DES to the SVG to Diag  . Cancer ([place])    Upper right lobe lung cancer  . Cardiomyopathy, ischemic    Echo 03/17/14: EF 45-50%  . Chronic bronchitis ([place])   . Chronic rhinitis   . Colonic polyp 2002   polypectomy in 2002  . COPD (chronic obstructive pulmonary disease) ([place])   . Diverticulosis   . ED (erectile dysfunction)   . Encounter for antineoplastic chemotherapy 12/19/2015  . GERD (gastroesophageal reflux disease)   . Hyperlipidemia   .  Hypertension   . IFG (impaired fasting glucose)   . Myocardial infarction    "told h/o silent MI sometime before 1996"  . Pneumonia ~ 2001; ~ 2005  . Right bundle branch block   . Tobacco abuse, in remission    40 pack year total consumption; discontinued in 1996    ALLERGIES:  is allergic to neomycin.  MEDICATIONS:  Current Outpatient Prescriptions  Medication Sig Dispense Refill  . acetaminophen (TYLENOL) 500 MG tablet Take 500 mg by mouth every 6 (six) hours as needed for mild pain.    Marland Kitchen aspirin EC 81 MG tablet Take 81 mg by mouth daily.    . budesonide-formoterol (SYMBICORT) 160-4.5 MCG/ACT inhaler Inhale 2 puffs into the lungs 2 (two) times daily.    . clopidogrel (PLAVIX) 75 MG tablet Take 1 tablet (75 mg total) by mouth daily. Reported on 12/02/2015 90 tablet 3  . Ferrous Sulfate (IRON) 28 MG TABS Take 28 mg by mouth daily.    . furosemide (LASIX) 40 MG tablet Take 1 tablet (40 mg total) by mouth daily. 30 tablet 11  . Metoprolol Tartrate 75 MG TABS Take 1 tablet by mouth 2 (two) times daily. 180 tablet 1  . Multiple Vitamins-Minerals (CENTRUM SILVER ADULT 50+) TABS Take 1 tablet by mouth daily.     Marland Kitchen omeprazole (PRILOSEC) 20 MG capsule TAKE ONE CAPSULE BY MOUTH DAILY. 90 capsule 1  . simvastatin (ZOCOR) 80 MG tablet Take 0.5 tablets (40 mg total) by  mouth at bedtime. 15 tablet 5  . VENTOLIN HFA 108 (90 Base) MCG/ACT inhaler INHALE 2 PUFFS BY MOUTH EVERY 4 TO 6 HOURS AS NEEDED FOR WHEEZING. 18 g 5  . nitroGLYCERIN (NITROSTAT) 0.4 MG SL tablet Place 1 tablet (0.4 mg total) under the tongue every 5 (five) minutes as needed for chest pain. (Patient not taking: Reported on 11/15/2016) 25 tablet 4   No current facility-administered medications for this visit.     SURGICAL HISTORY:  Past Surgical History:  Procedure Laterality Date  . ABDOMINAL AORTIC ANEURYSM REPAIR  11/2012  . ABDOMINAL AORTIC ENDOVASCULAR STENT GRAFT N/A 12/11/2012   Procedure: ABDOMINAL AORTIC ENDOVASCULAR STENT  GRAFT;  Surgeon: Mal Misty, MD;  Location: [facility];  Service: Vascular;  Laterality: N/A;  Ultrasound guided; [place]  . CARDIAC CATHETERIZATION  01/08/1995  . COLONOSCOPY  2002   polypectomy-patient denies  . CORONARY ANGIOPLASTY WITH STENT PLACEMENT  03/18/2014   "1"  . CORONARY ANGIOPLASTY WITH STENT PLACEMENT  03/24/2014   "1"  . CORONARY ARTERY BYPASS GRAFT  01/09/1995   "CABG X3"  . JOINT REPLACEMENT    . LAPAROSCOPIC CHOLECYSTECTOMY  12/2009  . LEFT AND RIGHT HEART CATHETERIZATION WITH CORONARY/GRAFT ANGIOGRAM N/A 03/18/2014   Procedure: LEFT AND RIGHT HEART CATHETERIZATION WITH Beatrix Fetters;  Surgeon: Blane Ohara, MD;  Location: [facility] CATH LAB;  Service: Cardiovascular;  Laterality: N/A;  . PERCUTANEOUS CORONARY STENT INTERVENTION (PCI-S)  03/18/2014   Procedure: PERCUTANEOUS CORONARY STENT INTERVENTION (PCI-S);  Surgeon: Blane Ohara, MD;  Location: [facility] CATH LAB;  Service: Cardiovascular;;  . PERCUTANEOUS CORONARY STENT INTERVENTION (PCI-S) N/A 03/24/2014   Procedure: PERCUTANEOUS CORONARY STENT INTERVENTION (PCI-S);  Surgeon: Blane Ohara, MD;  Location: [facility] CATH LAB;  Service: Cardiovascular;  Laterality: N/A;  . TOTAL HIP ARTHROPLASTY Left 01/21/2013   Procedure: TOTAL HIP ARTHROPLASTY ANTERIOR APPROACH;  Surgeon: Mauri Pole, MD;  Location: [facility];  Service: Orthopedics;  Laterality: Left;  Marland Kitchen VIDEO BRONCHOSCOPY N/A 11/17/2015   Procedure: VIDEO BRONCHOSCOPY WITH FLUORO;  Surgeon: Rigoberto Noel, MD;  Location: [place];  Service: Cardiopulmonary;  Laterality: N/A;    REVIEW OF SYSTEMS:  A comprehensive review of systems was negative.   PHYSICAL EXAMINATION: General appearance: alert, cooperative and no distress Head: Normocephalic, without obvious abnormality, atraumatic Neck: no adenopathy, no JVD, supple, symmetrical, trachea midline and thyroid not enlarged, symmetric, no tenderness/mass/nodules Lymph nodes: Cervical, supraclavicular, and axillary nodes  normal. Resp: clear to auscultation bilaterally Back: symmetric, no curvature. ROM normal. No CVA tenderness. Cardio: regular rate and rhythm, S1, S2 normal, no murmur, click, rub or gallop GI: soft, non-tender; bowel sounds normal; no masses,  no organomegaly Extremities: extremities normal, atraumatic, no cyanosis or edema  ECOG PERFORMANCE STATUS: 0 - Asymptomatic  Blood pressure 129/64, pulse 76, temperature 97.7 F (36.5 C), temperature source Oral, resp. rate 18, height '5\' 10"'$  (1.778 m), weight 188 lb (85.3 kg), SpO2 98 %.  LABORATORY DATA: Lab Results  Component Value Date   WBC 10.1 10/29/2016   HGB 11.9 (L) 10/29/2016   HCT 37.9 (L) 10/29/2016   MCV 89.4 10/29/2016   PLT 171 10/29/2016      Chemistry      Component Value Date/Time   NA 141 10/29/2016 0917   K 4.4 10/29/2016 0917   CL 96 02/29/2016 1218   CO2 30 (H) 10/29/2016 0917   BUN 20.4 10/29/2016 0917   CREATININE 1.6 (H) 10/29/2016 0917      Component Value Date/Time  CALCIUM 9.8 10/29/2016 0917   ALKPHOS 88 10/29/2016 0917   AST 18 10/29/2016 0917   ALT 13 10/29/2016 0917   BILITOT 0.50 10/29/2016 0917       RADIOGRAPHIC STUDIES: Ct Chest W Contrast  Result Date: 10/30/2016 CLINICAL DATA:  Lung cancer. EXAM: CT CHEST WITH CONTRAST TECHNIQUE: Multidetector CT imaging of the chest was performed during intravenous contrast administration. CONTRAST:  79m ISOVUE-300 IOPAMIDOL (ISOVUE-300) INJECTION 61% COMPARISON:  08/14/2016. FINDINGS: Cardiovascular: The heart size is normal. No pericardial effusion. The patient is status post CABG Atherosclerotic calcification is noted in the wall of the thoracic aorta. Mediastinum/Nodes: 10 mm subcarinal lymph node measured previously is now 13 mm short axis. No left hilar lymphadenopathy. Radiation fibrosis extends into the upper right hilum. Lungs/Pleura: Loculated right pleural effusion in the apex is stable. Posterior pleural fluid in the inferior hemithorax is  slightly increased. The post radiation change in the medial right lung is stable. Scarring medial left upper lobe also unchanged. Scattered areas of peripheral reticulonodular density are stable. Upper Abdomen: Atherosclerotic calcification noted within the visualized portion of the upper abdominal aorta. Musculoskeletal: Bone windows reveal no worrisome lytic or sclerotic osseous lesions. IMPRESSION: 1. Slight increase in right pleural effusion. 2. Stable post radiation changes medial right lung and stable scarring anterior left upper lobe likely treatment related. 3. Minimal increase in short axis diameter of the subcarinal lymph node. Continued attention on follow-up recommended. 4. Thoracoabdominal aortic atherosclerosis. Electronically Signed   By: EMisty StanleyM.D.   On: 10/30/2016 09:54    ASSESSMENT AND PLAN:  This is a very pleasant 76years old white male with a stage IIIa non-small cell lung cancer status post a course of concurrent chemoradiation with weekly carboplatin and paclitaxel. Had a rough time with the radiation therapy with radiation induced esophagitis and weight loss. He did not receive consolidation chemotherapy because of his condition at that time. The patient is currently on observation and close monitoring. His recent CT scan of the chest showed no clear evidence for disease progression. I discussed the scan results with the patient and showed them the images today. I recommended for him to continue on observation with repeat CT scan of the chest in 3 months. He was advised to call immediately if he has any concerning symptoms in the interval. The patient voices understanding of current disease status and treatment options and is in agreement with the current care plan.  All questions were answered. The patient knows to call the clinic with any problems, questions or concerns. We can certainly see the patient much sooner if necessary. I spent 10 minutes counseling the  patient face to face. The total time spent in the appointment was 15 minutes.  Disclaimer: This note was dictated with voice recognition software. Similar sounding words can inadvertently be transcribed and may not be corrected upon review.

## 2016-12-05 ENCOUNTER — Encounter: Payer: Self-pay | Admitting: Family Medicine

## 2016-12-05 ENCOUNTER — Ambulatory Visit (INDEPENDENT_AMBULATORY_CARE_PROVIDER_SITE_OTHER): Payer: Medicare Other | Admitting: Family Medicine

## 2016-12-05 VITALS — BP 128/72 | HR 80 | Ht 70.0 in | Wt 188.0 lb

## 2016-12-05 DIAGNOSIS — I5043 Acute on chronic combined systolic (congestive) and diastolic (congestive) heart failure: Secondary | ICD-10-CM

## 2016-12-05 DIAGNOSIS — I2510 Atherosclerotic heart disease of native coronary artery without angina pectoris: Secondary | ICD-10-CM

## 2016-12-05 DIAGNOSIS — J449 Chronic obstructive pulmonary disease, unspecified: Secondary | ICD-10-CM

## 2016-12-05 DIAGNOSIS — I10 Essential (primary) hypertension: Secondary | ICD-10-CM

## 2016-12-05 DIAGNOSIS — E7800 Pure hypercholesterolemia, unspecified: Secondary | ICD-10-CM | POA: Diagnosis not present

## 2016-12-05 DIAGNOSIS — J4489 Other specified chronic obstructive pulmonary disease: Secondary | ICD-10-CM

## 2016-12-05 NOTE — Progress Notes (Signed)
Subjective:    Patient ID: Paul Sandoval, male    DOB: November 28, 1940, 76 y.o.   MRN: [id number]  Hypertension  This is a chronic problem. The current episode started more than 1 year ago. There are no compliance problems (exercises and eats healthy).    Pt states no concerns today.  Results for orders placed or performed in visit on 10/29/16  Comprehensive metabolic panel  Result Value Ref Range   Sodium 141 136 - 145 mEq/L   Potassium 4.4 3.5 - 5.1 mEq/L   Chloride 101 98 - 109 mEq/L   CO2 30 (H) 22 - 29 mEq/L   Glucose 98 70 - 140 mg/dl   BUN 20.4 7.0 - 26.0 mg/dL   Creatinine 1.6 (H) 0.7 - 1.3 mg/dL   Total Bilirubin 0.50 0.20 - 1.20 mg/dL   Alkaline Phosphatase 88 40 - 150 U/L   AST 18 5 - 34 U/L   ALT 13 0 - 55 U/L   Total Protein 8.0 6.4 - 8.3 g/dL   Albumin 3.5 3.5 - 5.0 g/dL   Calcium 9.8 8.4 - 10.4 mg/dL   Anion Gap 9 3 - 11 mEq/L   EGFR 42 (L) >90 ml/min/1.73 m2  CBC with Differential/Platelet  Result Value Ref Range   WBC 10.1 4.0 - 10.3 10e3/uL   NEUT# 8.4 (H) 1.5 - 6.5 10e3/uL   HGB 11.9 (L) 13.0 - 17.1 g/dL   HCT 37.9 (L) 38.4 - 49.9 %   Platelets 171 140 - 400 10e3/uL   MCV 89.4 79.3 - 98.0 fL   MCH 28.1 27.2 - 33.4 pg   MCHC 31.4 (L) 32.0 - 36.0 g/dL   RBC 4.24 4.20 - 5.82 10e6/uL   RDW 14.8 (H) 11.0 - 14.6 %   lymph# 1.0 0.9 - 3.3 10e3/uL   MONO# 0.7 0.1 - 0.9 10e3/uL   Eosinophils Absolute 0.1 0.0 - 0.5 10e3/uL   Basophils Absolute 0.0 0.0 - 0.1 10e3/uL   NEUT% 83.0 (H) 39.0 - 75.0 %   LYMPH% 9.6 (L) 14.0 - 49.0 %   MONO% 6.7 0.0 - 14.0 %   EOS% 0.6 0.0 - 7.0 %   BASO% 0.1 0.0 - 2.0 %   Blood pressure medicine and blood pressure levels reviewed today with patient. Compliant with blood pressure medicine. States does not miss a dose. No obvious side effects. Blood pressure generally good when checked elsewhere. Watching salt intake.  Patient continues to take lipid medication regularly. No obvious side effects from it. Generally does not miss a  dose. Prior blood work results are reviewed with patient. Patient continues to work on fat intake in diet  Patient states to COPD/asthma clinically stable. Tolerated medicines. No obvious side effects. Rare use of inhaler. Next  Brings blood work is obtained via the [place]. All blood work decent. Fact creatinine improved. Vitamin D borderline low at 28  Review of Systems No headache, no major weight loss or weight gain, no chest pain no back pain abdominal pain no change in bowel habits complete ROS otherwise negative     Objective:   Physical Exam   Alert and oriented, vitals reviewed and stable, NAD ENT-TM's and ext canals WNL bilat via otoscopic exam Soft palate, tonsils and post pharynx WNL via oropharyngeal exam Neck-symmetric, no masses; thyroid nonpalpable and nontender Pulmonary-no tachypnea or accessory muscle use; Clear without wheezes via auscultation Card--no abnrml murmurs, rhythm reg and rate WNL Carotid pulses symmetric, without bruits  Assessment & Plan:  Impression 1 hypertension good control discussed maintain same meds #2 hyperlipidemia good control discussed maintain same meds #3 chronic asthma element of COPD clinically stable to maintain the Symbicort No. 4 borderline low vitamin D may initiate vitamin D supplement plus minus benefit from this discussed #5 chronic kidney disease clinically stable in fact creatinine improved plan maintain all medications. Diet exercise discussed. Follow-up in 6 months for wellness and chronic

## 2016-12-10 ENCOUNTER — Encounter: Payer: Self-pay | Admitting: Family Medicine

## 2016-12-10 ENCOUNTER — Ambulatory Visit (INDEPENDENT_AMBULATORY_CARE_PROVIDER_SITE_OTHER): Payer: Medicare Other | Admitting: Family Medicine

## 2016-12-10 VITALS — BP 114/70 | Temp 98.0°F | Ht 70.0 in | Wt 188.0 lb

## 2016-12-10 DIAGNOSIS — J111 Influenza due to unidentified influenza virus with other respiratory manifestations: Secondary | ICD-10-CM

## 2016-12-10 MED ORDER — DOXYCYCLINE HYCLATE 100 MG PO TABS: 100.0000 mg | ORAL_TABLET | Freq: Two times a day (BID) | ORAL | 0 refills | Status: DC

## 2016-12-10 NOTE — Progress Notes (Signed)
   Subjective:    Patient ID: Paul Sandoval, male    DOB: November 05, 1940, 76 y.o.   MRN: [id number]  Sinusitis  This is a new problem. Episode onset: 3 days. Associated symptoms include coughing and headaches. (Fever) Past treatments include acetaminophen (cough syrup, fluids).    fri afternoon starte dcoughing, dry at first  By next day was coughing up phlegm  Measure low gr fever all weekend  No sob  Some flare of wheezing using inhaler prn   Got the flu shot   Headache only mild in nature, took tyl prn    Review of Systems  Respiratory: Positive for cough.   Neurological: Positive for headaches.       Objective:   Physical Exam  Alert vitals reviewed, moderate malaise. Hydration good. Positive nasal congestion lungs no crackles mil diffuse  wheezes, no tachypnea, intermittent bronchial cough during exam heart regular rate and rhythm.       Assessment & Plan:  Impression influenza discussed at length. Petra Kuba of illness and potential sequela discussed. Plan Tamiflu prescribed if indicated and timing appropriate. Symptom care discussed.too late for tamiflu given doxy p for potential sec bronchitis/ early poneum (doubt) Warning signs discussed. WSL

## 2016-12-18 ENCOUNTER — Telehealth: Payer: Self-pay | Admitting: Pulmonary Disease

## 2016-12-18 MED ORDER — LEVOFLOXACIN 500 MG PO TABS: 500.0000 mg | ORAL_TABLET | Freq: Every day | ORAL | 0 refills | Status: DC

## 2016-12-18 MED ORDER — METHYLPREDNISOLONE 4 MG PO TBPK: ORAL_TABLET | ORAL | 0 refills | Status: DC

## 2016-12-18 NOTE — Progress Notes (Signed)
Cardiology Office Note    Date:  12/21/2016   ID:  Paul Sandoval, DOB [birth year], MRN [id number]  PCP:  [name], MD  Cardiologist:  Dr. Johnsie Cancel  CC: post hospital follow up  History of Present Illness:   76M with CAD (s/p CABG 1996, [place] 02/2014 s/p DES to dLM & staged PCI of SVG-diag), carotid disease and aortic stent graft followed by Dr. 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  In 02/2014 he underwent planned diagnostic left heart catheterization, revealing severe three-vessel coronary artery disease and total occlusion of the RCA, total occlusion of the LAD, severe ulcerative stenosis of the distal left main, and severe diffuse stenosis of the OM left circumflex branches. There was continued patency of the saphenous vein graft to his PDA LIMA to LAD and patent but severely diseased saphenous vein graft to the diagonal. The patient underwent PCI of the distal left main with drug-eluting stent. A staged PCI of the saphenous vein graft to the diagonal 02/2014. Last seen by me August.  No anginal pain.  He was diagnosed with a lung cancer in 10/2015 and completed radiation + carboplatin/paclitaxel therapy x 4 in early June.  He was seen in [facility] 02/21/16 and complaining of increased shortness of breath, fatigue and cough with temperature of 100.1. 7 admitted directly to [facility] for further evaluation where workup revealed worsening pneumonia on RUL.BNP was obtained which showed elevation >1000. Echo done during this admission showed reduction in his left ventricular function to 37-10%, grade 2 diastolic dysfunction, elevated ventricular end-diastolic filling pressure (EF of 45-50% 03/17/14). He was diuresed and outpatient myoview was recommended. His troponin was noted  to be elevated but c/w demand ischemia in the setting of acute CHF. His home HCTZ was discontinued and he was started on Lasix '40mg'$  daily.   02/29/16  BNP was still elevated with elevated CO2 and he was started on Diamox. He was continued on lasix '40mg'$  daily.   F/U myovue done 03/06/16 reviewed by Dr Henrene Dodge medical Rx   Nuclear stress EF: 30%. The LV function is markedly depressed  There was no ST segment deviation noted during stress.  Defect 1: There is a large defect of severe severity present in the basal anteroseptal, mid anterior, mid anteroseptal, apical anterior and apex location.  Findings consistent with prior anterior apical myocardial infarction. There is no evidence of reversible ischemia  This is a high risk study.  Cath not planned due to lack of symptoms and advanced lung cancer No chest pain    Recurrent right pleural effusions last thoracentesis 06/22/16  No cancer cells Has been on antibiotics and predpak for bronchitis last 2 weeks   Past Medical History:  Diagnosis Date  . AAA (abdominal aortic aneurysm) ([place]) 2010   4.4 cm 08/2008;4.44 in 7/10 and 4.65 in 08/2009; 4.8 by CT in 11/2009; 4.3 by ultrasound in 08/2010  . Arteriosclerotic cardiovascular disease (ASCVD) 1996   CABG-1996  . Arthritis    "fingers" (03/18/2014)  . CAD (coronary artery disease)    03/18/14:  PCI with DES to distal left main. 7/29: DES to the SVG to Diag  . Cancer ([place])    Upper right lobe lung cancer  . Cardiomyopathy, ischemic    Echo 03/17/14: EF 45-50%  .  Chronic bronchitis ([place])   . Chronic rhinitis   . Colonic polyp 2002   polypectomy in 2002  . COPD (chronic obstructive pulmonary disease) ([place])   . Diverticulosis   . ED (erectile dysfunction)   . Encounter for antineoplastic chemotherapy 12/19/2015  . GERD (gastroesophageal reflux disease)   . Hyperlipidemia   . Hypertension   . IFG (impaired fasting glucose)   . Myocardial infarction [facility])    "told h/o silent MI sometime  before 1996"  . Pneumonia ~ 2001; ~ 2005  . Right bundle branch block   . Tobacco abuse, in remission    40 pack year total consumption; discontinued in 1996    Past Surgical History:  Procedure Laterality Date  . ABDOMINAL AORTIC ANEURYSM REPAIR  11/2012  . ABDOMINAL AORTIC ENDOVASCULAR STENT GRAFT N/A 12/11/2012   Procedure: ABDOMINAL AORTIC ENDOVASCULAR STENT GRAFT;  Surgeon: Mal Misty, MD;  Location: [place];  Service: Vascular;  Laterality: N/A;  Ultrasound guided; [place]  . CARDIAC CATHETERIZATION  01/08/1995  . COLONOSCOPY  2002   polypectomy-patient denies  . CORONARY ANGIOPLASTY WITH STENT PLACEMENT  03/18/2014   "1"  . CORONARY ANGIOPLASTY WITH STENT PLACEMENT  03/24/2014   "1"  . CORONARY ARTERY BYPASS GRAFT  01/09/1995   "CABG X3"  . JOINT REPLACEMENT    . LAPAROSCOPIC CHOLECYSTECTOMY  12/2009  . LEFT AND RIGHT HEART CATHETERIZATION WITH CORONARY/GRAFT ANGIOGRAM N/A 03/18/2014   Procedure: LEFT AND RIGHT HEART CATHETERIZATION WITH Beatrix Fetters;  Surgeon: Blane Ohara, MD;  Location: [facility] CATH LAB;  Service: Cardiovascular;  Laterality: N/A;  . PERCUTANEOUS CORONARY STENT INTERVENTION (PCI-S)  03/18/2014   Procedure: PERCUTANEOUS CORONARY STENT INTERVENTION (PCI-S);  Surgeon: Blane Ohara, MD;  Location: [facility] CATH LAB;  Service: Cardiovascular;;  . PERCUTANEOUS CORONARY STENT INTERVENTION (PCI-S) N/A 03/24/2014   Procedure: PERCUTANEOUS CORONARY STENT INTERVENTION (PCI-S);  Surgeon: Blane Ohara, MD;  Location: [facility] CATH LAB;  Service: Cardiovascular;  Laterality: N/A;  . TOTAL HIP ARTHROPLASTY Left 01/21/2013   Procedure: TOTAL HIP ARTHROPLASTY ANTERIOR APPROACH;  Surgeon: Mauri Pole, MD;  Location: [place];  Service: Orthopedics;  Laterality: Left;  Marland Kitchen VIDEO BRONCHOSCOPY N/A 11/17/2015   Procedure: VIDEO BRONCHOSCOPY WITH FLUORO;  Surgeon: Rigoberto Noel, MD;  Location: [place];  Service: Cardiopulmonary;  Laterality: N/A;    Current  Medications: Outpatient Medications Prior to Visit  Medication Sig Dispense Refill  . acetaminophen (TYLENOL) 500 MG tablet Take 500 mg by mouth every 6 (six) hours as needed for mild pain.    Marland Kitchen aspirin EC 81 MG tablet Take 81 mg by mouth daily.    . budesonide-formoterol (SYMBICORT) 160-4.5 MCG/ACT inhaler Inhale 2 puffs into the lungs 2 (two) times daily.    . clopidogrel (PLAVIX) 75 MG tablet Take 1 tablet (75 mg total) by mouth daily. Reported on 12/02/2015 90 tablet 3  . doxycycline (VIBRA-TABS) 100 MG tablet Take 1 tablet (100 mg total) by mouth 2 (two) times daily. 20 tablet 0  . Ferrous Sulfate (IRON) 28 MG TABS Take 28 mg by mouth daily.    . furosemide (LASIX) 40 MG tablet Take 1 tablet (40 mg total) by mouth daily. 30 tablet 11  . levofloxacin (LEVAQUIN) 500 MG tablet Take 1 tablet (500 mg total) by mouth daily. 7 tablet 0  . methylPREDNISolone (MEDROL) 4 MG TBPK tablet Take as directed 21 tablet 0  . Metoprolol Tartrate 75 MG TABS Take 1 tablet by mouth 2 (two) times daily. 180 tablet  1  . Multiple Vitamins-Minerals (CENTRUM SILVER ADULT 50+) TABS Take 1 tablet by mouth daily.     . nitroGLYCERIN (NITROSTAT) 0.4 MG SL tablet Place 1 tablet (0.4 mg total) under the tongue every 5 (five) minutes as needed for chest pain. 25 tablet 4  . omeprazole (PRILOSEC) 20 MG capsule TAKE ONE CAPSULE BY MOUTH DAILY. 90 capsule 1  . simvastatin (ZOCOR) 80 MG tablet Take 0.5 tablets (40 mg total) by mouth at bedtime. 15 tablet 5  . VENTOLIN HFA 108 (90 Base) MCG/ACT inhaler INHALE 2 PUFFS BY MOUTH EVERY 4 TO 6 HOURS AS NEEDED FOR WHEEZING. 18 g 5   No facility-administered medications prior to visit.      Allergies:   Neomycin   Social History   Social History  . Marital status: Married    Spouse name: N/A  . Number of children: 1  . Years of education: N/A   Occupational History  . Retired     [employer]   Social History Main Topics  . Smoking status: Former Smoker     Packs/day: 1.50    Years: 30.00    Types: Cigarettes    Start date: 12/01/1956    Quit date: 01/08/1995  . Smokeless tobacco: Never Used  . Alcohol use 0.0 oz/week     Comment: 03/18/2014 "no alacohol since 1996"  . Drug use: No  . Sexual activity: No   Other Topics Concern  . None   Social History Narrative  . None     Family History:  The patient's family history includes Arthritis in his mother and sister; Cancer in his father; Heart disease in his father; Hypertension in his brother; Parkinsonism in his mother.     ROS:   Please see the history of present illness.    ROS All other systems reviewed and are negative.   PHYSICAL EXAM:   VS:  BP 120/60   Pulse 85   Ht '5\' 10"'$  (1.778 m)   Wt 184 lb 1.9 oz (83.5 kg)   SpO2 96%   BMI 26.42 kg/m    Affect appropriate Healthy:  appears stated age 23: normal Neck supple with no adenopathy JVP normal no bruits no thyromegaly Lungs LULobectomy scar  no wheezing and good diaphragmatic motion Heart:  S1/S2 no murmur, no rub, gallop or click PMI normal Abdomen: benighn, BS positve, no tenderness, no AAA no bruit.  No HSM or HJR Distal pulses intact with Femoral bruits No edema Neuro non-focal Skin warm and dry No muscular weakness    Wt Readings from Last 3 Encounters:  12/21/16 184 lb 1.9 oz (83.5 kg)  12/10/16 188 lb (85.3 kg)  12/05/16 188 lb (85.3 kg)      Studies/Labs Reviewed:   EKG:   ST/Flutter rate 119  RBBB  12/21/16 NSR rate 80 RBBB LPFB    Recent Labs: 02/21/2016: B Natriuretic Peptide 942.6 02/23/2016: Magnesium 1.9 02/24/2016: TSH 0.918 02/29/2016: Pro B Natriuretic peptide (BNP) 1,053.0 10/29/2016: ALT 13; BUN 20.4; Creatinine 1.6; HGB 11.9; Platelets 171; Potassium 4.4; Sodium 141   Lipid Panel    Component Value Date/Time   CHOL 158 05/10/2016 1105   TRIG 157 (H) 05/10/2016 1105   HDL 37 (L) 05/10/2016 1105   CHOLHDL 4.3 05/10/2016 1105   CHOLHDL 4.4 06/30/2014 0810   VLDL 45 (H) 06/30/2014  0810   LDLCALC 90 05/10/2016 1105    Additional studies/ records that were reviewed today include:  2D ECHO: 02/22/2016 LV EF: 30% -  35% Study Conclusions - Left ventricle: There is diffuse hypokinesis and akinesis of the mid anteroseptal, aterior and anterolateral walls and apical anterior and septal walls. The cavity size was normal. There was mild concentric hypertrophy. Systolic function was moderately to severely reduced. The estimated ejection fraction was in the range of 30% to 35%. Features are consistent with a pseudonormal left ventricular filling pattern, with concomitant abnormal relaxation and increased filling pressure (grade 2 diastolic dysfunction). Doppler parameters are consistent with elevated ventricular end-diastolic filling pressure. - Aortic valve: Trileaflet; normal thickness leaflets. There was no regurgitation. - Aortic root: The aortic root was normal in size. - Mitral valve: Structurally normal valve. There was mild  regurgitation. - Left atrium: The atrium was mildly dilated. - Right ventricle: The cavity size was normal. Wall thickness was normal. Systolic function was normal. - Right atrium: The atrium was normal in size. - Tricuspid valve: There was mild regurgitation. - Pulmonic valve: There was trivial regurgitation. - Pulmonary arteries: Systolic pressure was within the normal range. - Inferior vena cava: The vessel was normal in size. - Pericardium, extracardiac: There was no pericardial effusion.    ASSESSMENT & PLAN:   Chronic combined CHF with cardiomyopathy - EF 30-35% by most recent echo 02/22/16  If prognosis from lung cancer improves will work up heart further. Echo f/u with me in 6 months He has f/u with [facility] June with PET/CT.  Will consider right and left cath at that time if lung Cancer prognosis ok   CAD - Post CABG with stenting of native LM 2015  continue ASA, Plavix, BB, statin. Lexiscan myoview non ischemic  03/06/16   Consider  cath in 6 months if prognosis lung cancer ok   HTN - controlled.  CKD stage III - baseline Cr appears 1.3-1.6. 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 No transfusion on most recent admission.   Lab Results  Component Value Date   HCT 37.9 (L) 10/29/2016    Right Lung NSCLC Stage IIIA (T2b, N2, M0) - undergoing chemotherapy/XRT . Followed by Dr. Julien Nordmann in the [place]. PET/CT June   PVD:  Aorto bi-iliac stent graft Reviewed CTA done 01/25/16 and no endoleak with aneurysm sac decreased to 3.9 cm  Jenkins Rouge, MD

## 2016-12-18 NOTE — Telephone Encounter (Signed)
Spoke with pt, aware that we are waiting on SN's recs, and will be calling with recs before we leave today.  Pt expressed understanding.  SN please advise on recs.  Thanks.

## 2016-12-18 NOTE — Telephone Encounter (Signed)
Patient requesting a callback. Patient contact # [phone].Paul Sandoval

## 2016-12-18 NOTE — Telephone Encounter (Signed)
Per SN---  Call in levaquin 500 mg  1 daily Medrol dosepak  #1  Take as directed.     Called and spoke with pt and he is aware of meds that have been sent to the pharmacy.  Nothing further is needed.

## 2016-12-18 NOTE — Telephone Encounter (Signed)
Pt intermittent low grade temp, prod cough with yellow mucus  s/s present X1 week. Denies sinus congestion, PND, chest pain.   Pt was put on doxycycline '100mg'$  bid (last dose tomorrow), robitussin DM by pcp on 12/10/2016.  Requesting further recs from SN>  Pt had requested OV with SN but SN has no openings this week.   Pt uses [facility]   SN please advise on further recs.  Thanks.

## 2016-12-20 ENCOUNTER — Ambulatory Visit: Payer: Medicare Other | Admitting: Cardiovascular Disease

## 2016-12-21 ENCOUNTER — Encounter: Payer: Self-pay | Admitting: Cardiovascular Disease

## 2016-12-21 ENCOUNTER — Ambulatory Visit (INDEPENDENT_AMBULATORY_CARE_PROVIDER_SITE_OTHER): Payer: Medicare Other | Admitting: Cardiovascular Disease

## 2016-12-21 VITALS — BP 120/60 | HR 85 | Ht 70.0 in | Wt 184.1 lb

## 2016-12-21 DIAGNOSIS — I6523 Occlusion and stenosis of bilateral carotid arteries: Secondary | ICD-10-CM | POA: Diagnosis not present

## 2016-12-21 DIAGNOSIS — I428 Other cardiomyopathies: Secondary | ICD-10-CM

## 2016-12-21 DIAGNOSIS — I2510 Atherosclerotic heart disease of native coronary artery without angina pectoris: Secondary | ICD-10-CM

## 2016-12-21 NOTE — Patient Instructions (Addendum)

## 2017-01-02 ENCOUNTER — Encounter: Payer: Self-pay | Admitting: Pulmonary Disease

## 2017-01-02 ENCOUNTER — Ambulatory Visit (INDEPENDENT_AMBULATORY_CARE_PROVIDER_SITE_OTHER): Payer: Medicare Other | Admitting: Pulmonary Disease

## 2017-01-02 VITALS — BP 116/60 | HR 76 | Temp 97.4°F | Ht 70.0 in | Wt 183.0 lb

## 2017-01-02 DIAGNOSIS — C3411 Malignant neoplasm of upper lobe, right bronchus or lung: Secondary | ICD-10-CM | POA: Diagnosis not present

## 2017-01-02 DIAGNOSIS — I679 Cerebrovascular disease, unspecified: Secondary | ICD-10-CM

## 2017-01-02 DIAGNOSIS — Z951 Presence of aortocoronary bypass graft: Secondary | ICD-10-CM | POA: Diagnosis not present

## 2017-01-02 DIAGNOSIS — I2510 Atherosclerotic heart disease of native coronary artery without angina pectoris: Secondary | ICD-10-CM

## 2017-01-02 DIAGNOSIS — I714 Abdominal aortic aneurysm, without rupture, unspecified: Secondary | ICD-10-CM

## 2017-01-02 DIAGNOSIS — Z95828 Presence of other vascular implants and grafts: Secondary | ICD-10-CM

## 2017-01-02 DIAGNOSIS — I255 Ischemic cardiomyopathy: Secondary | ICD-10-CM

## 2017-01-02 DIAGNOSIS — N183 Chronic kidney disease, stage 3 unspecified: Secondary | ICD-10-CM

## 2017-01-02 DIAGNOSIS — J449 Chronic obstructive pulmonary disease, unspecified: Secondary | ICD-10-CM

## 2017-01-02 DIAGNOSIS — I5042 Chronic combined systolic (congestive) and diastolic (congestive) heart failure: Secondary | ICD-10-CM | POA: Diagnosis not present

## 2017-01-02 NOTE — Progress Notes (Signed)
Subjective:     Patient ID: Paul Sandoval, male   DOB: 1941/01/04, 76 y.o.   MRN: [id number]  HPI  ~  February 29, 2016:  Crystal w/ SN>  Paul Sandoval is a 76 y/o WF w/ mult medical problems as noted below who has seen DrAlva & DrMannam previously;  I have reviewed his extensive epic records and recent [place] x2 in June2017 and formulated the following PROBLEM LIST>>  His PCP is DrLuking in [place]... He reminded me that I cared for his dad in 64...    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    ~  [place]>  Fever, cough, increased RUL opac (PCT<0.10, he received 10d Levaquin) & right>left effusion w/ thoracentesis 02/22/16 removing 1200cc clear yellow fluid, prob transudate, & BNP was >900; meds adjusted & he was diuresed w/ improvement but 2DEcho revealed worsening CHF w/ EF ~30-35% + HK & AK => he has f/u pending w/ Cards...    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    ASPVD- s/p AAA stent graft 11/2012, mod bilat carotid occlusive dis (asymptomatic) w/ CDopplers showing ~50-60% bilat ICAstenoses>  [facility] plans yearly f/u CT Abd and CDopplers    MEDICAL issues>  HBP, HL, GERD/ Divertic/ Polyps, CKD-stage3, Anemia & thrombocytopenia>  On Simva80-1/2Qd, Prilosec20/d, Fe/ MVI/ etc... Since he was disch 02/25/16>  He reports feeling better, breathing better, still sl SOB w/ activ but not requiring O2, mild cough w/ beige sput, no hemoptysis, denies CP=> he is due for f/u CXR & blood work post hosp... EXAM shows Afeb, VSS, O2sat=95% on RA after walking back; Wt=180#;  HEENT- neg, mallampati2;  Chest- sl decr BS right base, few rhonchi, w/o w/r/consolidation;  Heart- RR Gr1/6SEM w/o r/g;  Abd- soft, neg;  Ext- neg w/o c/c/e...  PFT 02/01/16>  FVC=1.96 (45%), FEV1=1.25 (40%), %1sec=64, mid-flows were reduced at 28% predicted; post bronchodil there was a 31% improvement in FEV1 to 1.64;  TLC=5.96 (83%, RV=3.89 (  149%), RV/TLC=65%;  DLCO=58% pred.  This is c/w moderate to severe airflow obstruction, GOLD Stage 3 COPD w/ a signif asthmatic component, air trapping, and decr diffusion capacity...  2DEcho 02/22/16>  Mild conc LVH w/ decr LVF & EF=30-35% w/ diffuse HK & AK in several walls (see report), Gr2 DD, mild MR otherw norm valves, mild LAdil (25m), norm RV function & PA pressures  LABS 01/2016 Hosp>  Chems- ok x Cr=1.3-1.4, BS=120-140, Alb=2.8;  CBC- anemic w/ Hg=8-9 range, Plat~130K range;  TSH=0.92  CT Chest 02/21/16>  Norm heart size, extensive coronary calcif, s/p CABG, decr size of the pathological  right paratracheal LN, decr size of RUL mass (now ~2cm & prev ~4cm), new confluent airsp opac w/ air bronchograms in RUL w/ large right effusion; s/p GB, +HH, Abd Ao stent graft, DJD & DISH w/o metastatic lesions  CT Angio Chest 02/24/16>  NEG for PE, s/pCABG & stent in Lmain, norm heart size, no pericard fluid, small right pleural effusion, architectural distortion & interstitial opacities in RUL c/w XRT changes, RUL nodule measures ~2cm, no adenopathy reported...   CXR 02/29/16>  Stable heart size, tortuous Ao, s/p CABG, sl improvement in interstitial opac in RUL area & posteromedial RUL nodule  LABS 02/29/16>  Chems- ok x HCO3=36, BS=120, BUN/Cr=30/1.40;  CBC- Hg=10.9, Plat=249K, WBC=18.2 (on Pred);  BNP=1053;  Sed=53 IMP/PLAN>>  Problem list as above w/ signif cardiac, pulmonary, & post chemoradiation changes;  From the pulm standpoint- he feels the BREO is helping & hasn't needed the rescue inhaler very often, continue same;  He is on a long PRED taper per DrManning for poss radiation fibrosis RUL, Sed rate is 53, continue this taper & careful w/ sweets/ sugar etc;  From the cardiac standpoint- he has signif underlying dis w/ Lmain stent, s/p CABG 20 yrs ago, prob ischemic cardiomyopathy w/ worsening 2DEcho recently w/ 30-35% EF + HK&AK seen, BNP=1053, Cards plans ischemic work up when able, in the meanwhile he has improved w/ diuresis but Chems w/ mild RI & HCO3=36... REC- add DIAMOX-ER 500 one tab daily at 4pm, continue his Lasix40 Qam, increase water intake, watch weights... We plan ROV [id number]...  ~  April 04, 2016:  50m74moV w/ SN>> BilDontrelllled the Oncology symptom management clinic 8/4 w/ intermittent cough, wheezing, fever; they did a f/u CXR interpreted as showing a right base opac suspicious for acute pneumonia but this is a soft finding on my review of the XRay, and the prev RUL reticulonod opac is improved (radiation fibrosis improved w/ Pred taper); placed on Levaquin Rx;  He denies sput  production, hemoptysis, CP, or SOB but he is fairly sedentary.    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    ~  [place]>  Fever, cough, increased RUL opac (PCT<0.10, he received 10d Levaquin) & right>left effusion w/ thoracentesis 02/22/16 removing 1200cc clear yellow fluid, prob transudate, & BNP was >900; meds adjusted & he was diuresed w/ improvement but 2DEcho revealed worsening CHF w/ EF ~30-35% + HK & AK => he has f/u pending w/ Cards...    ~  [facility]  He had right pleural effusion tapped 02/22/16=> Prob TRANSUDATE w/ TProt<3.0, LDH=95, Cytology=NEG (reactive mesothelial cells only);  Cults=> no growth    ~  03/2016 developed cough/ wheezing/ low grade fever=> CXR w/ improved RUL, ?incr markings R base, he was off the Pred, given more Levaquin...     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    ~  He saw CARDS PA 03/01/16> no change in meds, Lexiscan Myoview 03/06/16 showed hi risk study w/ EF=30%, no ST segm changes, c/w antero-apical MI & no evid reversible ischemia...    ASPVD- s/p AAA stent graft 11/2012, mod bilat carotid occlusive dis (asymptomatic) w/ CDopplers showing ~50-60% bilat ICAstenoses>  [facility] plans yearly f/u CT Abd and CDopplers    MEDICAL issues>  HBP, HL, GERD/ Divertic/ Polyps, CKD-stage3, Anemia & thrombocytopenia>  On Simva80-1/2Qd, Prilosec20/d, Fe/ MVI/ etc... EXAM shows Afeb, VSS, O2sat=99% on RA; Wt=178#;  HEENT- neg, mallampati2;  Chest- sl decr BS right base, few rhonchi, w/o w/r/consolidation;  Heart- RR Gr1/6SEM w/o r/g;  Abd- soft, neg;  Ext- neg w/o c/c/e...  Lexiscan Myoview 03/06/16 showed hi risk study w/ EF=30%, no ST segm changes, c/w antero-apical MI & no evid reversible ischemia...  CXR 03/30/16>  incr patchy opac at the right base, no effusion, RUL reticulonodular opac in RUL has regressed; norm heart size s/p CABG, abd Ao endograft part vis  LABS 03/28/16>  Chems- ok w/ K=4.0, HCO3=23, Cr=1.6, BS=145;  CBC- ok w/ Hg=11.4, wbc=6.1, plat=85K;   IMP/PLAN>>  Discussed w/ Paul Sandoval-- continue the Levaquin til gone, use Tylenol for fever or pain; continue the Breo daily & Ventolin-HFA as needed; wean off the Diamox at this time, continue Lasix40, no salt, etc; he needs to gradually increase his exercise/ mobility; we will plan ROV w/ labs in 230mo.  ~  June 06, 2016:  53m153moV w/ SN>  BilDaaielports that he is feeling better, gaining strength, walking 53mi49m mowing yard, and resting well at night;  He notes min hacking cough, no sput- no discoloration or blood, SOB is diminished & he denies CP, no f/c/s... He saw DrMohamed 05/16/16- pt remains on observation (s/p chemoradiation, 5 cycles w/ partial  response); repeat CT Chest 05/10/16 showed more confluent airsp dis in RUL w/ vol loss, abn soft tissue attenuation in right hilum has progressed (?felt to be treatment related), stable subcarinal LN ~11mm19mderate right effusion... DrMohamed felt this scan showed no concerning features of dis progression & they opted for continued observ  w/ f/u 50mo.. 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    ~  [place]>  Fever, cough, increased RUL opac (PCT<0.10, he received 10d Levaquin) & right>left effusion w/ thoracentesis 02/22/16 removing 1200cc clear yellow fluid, prob transudate, & BNP was >900; meds adjusted & he was diuresed w/ improvement but 2DEcho revealed worsening CHF w/ EF ~30-35% + HK & AK => he has f/u pending w/ Cards...    ~  [facility]  He had right pleural effusion tapped 02/22/16=> Prob TRANSUDATE w/ TProt<3.0, LDH=95, Cytology=NEG (reactive mesothelial cells only);  Cults=> no growth    ~  03/2016 developed cough/ wheezing/ low grade fever=> CXR w/ improved RUL, ?incr markings R base,  he was off the Pred, given more Levaquin...     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    ~  He saw CARDS PA 03/01/16> no change in meds, Lexiscan Myoview 03/06/16 showed hi risk study w/ EF=30%, no ST segm changes, c/w antero-apical MI & no evid reversible ischemia...    ASPVD- s/p AAA stent graft 11/2012, mod bilat carotid occlusive dis (asymptomatic) w/ CDopplers showing ~50-60% bilat ICAstenoses>  [facility]- plans yearly f/u CT Abd and CDopplers    MEDICAL issues>  HBP, HL, GERD/ Divertic/ Polyps, CKD-stage3, Anemia & thrombocytopenia>  On Simva80-1/2Qd, Prilosec20/d, Fe/ MVI etc... EXAM shows Afeb, VSS, O2sat=95% on RA; Wt=183# (up 5#);  HEENT- neg, mallampati2;  Chest- sl decr BS right base, few rhonchi, w/o w/r/consolidation;  Heart- RR Gr1/6SEM w/o r/g;  Abd- soft, neg;  Ext- neg w/o c/c/e...  CT Chest 05/10/16 showed more confluent airsp dis in RUL w/ vol loss, abn soft tissue attenuation in right hilum has progressed (?felt to be treatment related), stable subcarinal LN ~1161mmoderate right effusion.  CXR 06/06/16> progressive incr density in RUL & vol loss, soft tissue fullness in right hilum, s/p CABG and calcif in wall of Ao arch  PET scan 06/15/16> persistent but diminished hypermetabolism in the RUL area of interstitial & airsp opac-  likely related to XRT, no hypermetabolism in the prev R paratrachial LN, similar sized subcarinal LN w/ low level hypermetabolism noted; new area of LUL anterior  interstitial & airsp dis w/ some bronchiectasis shows low level hypermetabolism (?radiation change?); moderate right effusion...  IMP/PLAN>>  He is feeling better, performance status improving, CXR shows progressive changes in RUL s/p chemoradiation; CT=>PET scans w/ vol loss & persistent hypermetabolism/ evolving changes in RUL & right hilum plus he has a mod large right effusion; prev thoracentesis 01/2016 in hosp was transudative- I believe he would benefit from further eval/ repeat thoracentesis to recheck this fluid; we will set this up via IR & ask them to drain the fluid as much as poss, send it for Cytology, cell ct & diff, TProt/ LDH/ Gluc...  ~  August 06, 2016:  27moROV w/ SN>  BBrasonreports that his SOB & energy have improved, appetite is better; he is more active walking 292m5d/wk + yard work/ mowing/ leaves/ etc; he notes min cough, small amt beige sput, stable DOE, no CP or edema... He tells me that DrMohamed has CT Chest & LABS ordered for next week so we will wait for these tests & review when avail...     COPD (Stage3 w/ signif revers component), former smoker (quit 1996 w/ ~40+ pack-yr hx)>  On Symbicort160-2spBid regularly ([facility] changed Rx), VentolinHFA rescue inhaler as needed...    He saw DrCherly Hensenor CARDS 07/04/16>  HBP, CAD- s/p CABG 1996 & subseq PCI to graft vessels, chr combined sys&diast CHF w/ cardiomyopathy, PVD- w/ carotid dis & Ao stent graft per drLawson, HL, CKD;  No changes made to his ASA/ Plavix, Metoprolol50-1.5Bid, Lasix40, Simva40...     EXAM shows Afeb, VSS, O2sat=98% on RA; Wt=187# (up 4#);  HEENT- neg, mallampati2;  Chest- sl decr BS right base, few rhonchi, w/o w/r/consolidation;  Heart- RR Gr1/6SEM w/o r/g;  Abd- soft, neg;  Ext- neg w/o c/c/e...  Throacentesis 06/22/17 by IR>  1.2L removed (hazy yellow fluid)=> Cell ct=923 cells w/ 2 Neutro/ 58 lymph/ 40 mono-macrophages;  Chems- LDH=119, TPro=4.1, Gluc=101;  Cyto= Atypic cells c/w reactive mesothel cells.  Post  thoracentesis CXR 06/22/17>  decr right effusion, no pneumoth, dense consolid RUL unchanged, post-op BABG...  CT Chest 08/14/16>  Norm heart size- s/p CABG & Ao atherosclerotic changes; stable upper lim adenopathy; decr right effusion- now small in size; similar interstitial & airsp dis in right perihilar & medial LUL is c/w radiation fibrosis; no signif pulm nodules or masses- no new or progressive dis...   LABS 08/14/17>  Chems- ok x Cr=1.6;  CBC- ok w/ Hg=12.7...Marland KitchenMarland KitchenMP/PLAN>>  BiCalums clinically stable, no clear evid of recurrent dis & followed very closely by DrMohamed; Pulm stable on Symbicort Bid, Albut rescue prn, and his exercise- continue same & we plan rov recheck in 48m51mo  ~  November 05, 2016:  48mo80mo & Paul Sandoval indicates "stronger, better" on diet & exercise program w/ good energy (eg- climbed steps at a ball game);  He denies much cough (clear throat), small amt clear sput, no blood, SOB w/ exertion only- ADLs ok & DOE stable, no CP & he describes 2mi 56mk 5d/wk & he stays busy...  [facility] ([facility] subcarinal LN, scarring & radiation fibrosis, ?sl incr right effusion)... 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    ~  [place]>  Fever, cough, increased RUL opac (PCT<0.10, he received 10d Levaquin) & right>left effusion w/ thoracentesis 02/22/16 removing 1200cc clear yellow fluid, prob transudate, & BNP was >900; meds adjusted & he was diuresed w/ improvement but 2DEcho revealed worsening CHF w/ EF ~30-35% + HK & AK => he has f/u pending w/ Cards...    ~  [facility]  He had right pleural effusion tapped 02/22/16=> Prob TRANSUDATE w/ TProt<3.0, LDH=95, Cytology=NEG (reactive mesothelial cells only);  Cults=> no growth    ~  03/2016 developed cough/ wheezing/ low grade fever=> CXR w/ improved RUL, ?incr markings R base, he was off the Pred, given more Levaquin...     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    ~  He saw CARDS PA 03/01/16> no change in meds, Lexiscan Myoview 03/06/16 showed hi risk study w/ EF=30%, no ST segm changes, c/w antero-apical MI & no evid reversible ischemia...    ASPVD- s/p AAA stent graft 11/2012, mod bilat carotid occlusive dis (asymptomatic) w/ CDopplers showing ~50-60% bilat ICAstenoses>  [facility]- plans yearly f/u CT Abd and CDopplers    MEDICAL issues>  HBP, HL, GERD/ Divertic/ Polyps, CKD-stage3 (cr=1.6), Anemia (Hg=11.9) & thrombocytopenia (plat=171K)>  On Simva80-1/2Qd, Prilosec20/d, Fe/ MVI etc... EXAM shows Afeb, VSS, O2sat=98% on RA; Wt=189# (up 2#);  HEENT- neg,  mallampati2;  Chest- sl decr BS right base, few rhonchi, w/o w/r/consolidation;  Heart- RR Gr1/6SEM w/o r/g;  Abd- soft, neg;  Ext- neg w/o c/c/e...  CT Chest 10/30/16>  Norm heart size, s/p CABG, atherosclerosis in Ao; prev 154msubcarinal LN now measures 1376msl incr in right pleural effusion, radiation fibrosis & changes in medial right lung and ant LULare stable/ unchanged;   LABS 10/29/16>  Chems- wnl w/ Cr=1.6, LFTs= wnl;  CBC- ok w/ Hg=11.9, WBC=10.1  Ambulatory Oximetry 11/05/16>  O2sat=96% on RA at rest w/ pulse=81/min;  He ambulated 3 laps in office (185'each) w/ lowest O2sat=90% w/ pulse 99/min... IMP/PLAN>>  BilLarance doing satis on Symbicort160-2spBid & Albut rescue inhaler prn; he states that he is active, energy is good, & that he's getting stronger &  better (good performance status); I suspect that he has residual cancer in the right chest but it appears slowly progressive (w/ sl incr right effusion & subcarinal LN) and DrMohamed to review status & decide regarding additional therapy at this point vs continued observation... we plan rov recheck in 3-368mo  NOTE:  >50% of this 25 min rov was spent in counseling & coordination of care...   ~  Jan 02, 2017:  2768moOV & Paul Sandoval called 4/24 c/o cough, yellow sput, low grade fever after having been Rx x2 by PCP w/ Doxy(note- Flu-B was pos in resp viral panel); we placed him on Levaquin & Medrol dosepak & he is some better- still congested and phlegm is beige but feeling better overall; we discussed addition of Mucinex 1200bid + fluids vs Guaifenesin 400-2Tid to save $$...     COPD> on symbicort160-2spBid and Ventolin-HFA prn    Lung cancer- followed by DrMohamed & DrManning> RUL non-small cell ca IIIA, dx 3/17 & rx w/ Chemoradiation; subseq RUL opac (infection vs XRT-fibrosis & right effusion;  He saw DrMohamed 11/15/16- note reviewed & he was doing well at that time, playing golf & feeling good; CT Chest 10/30/16 w/ sl incr right effusion, sl incr  subcarinal LN from 10=>1315mradiation fibrosis areas stable; DrMohamed felt there was no clear sign of dis progression & rec f/u scans in 68mo68moe 01/2017)...    Cardiac- CAD, s/p CABG, Cardiomyopathy, CHF> on ASA/ Plavix, Metop25Bid, Lasix40; he saw DrNiCherly Hensen7/18- note reviewed; EF=30%, he mentioned more aggressive cardiac w/u & rx if cancer progrosis favorable (no changes made, f/u 90mo 34moDEcho)...    ASPVD- s/p AAA stent graft, bilat carotid dis> on ASA/ Plavix    Medical issues>  HBP, HL, GERD/ Divertic/ Polyps, CKD-stage3 (cr=1.6), Anemia (Hg=11.9) & thrombocytopenia (plat=171K)> EXAM shows Afeb, VSS, O2sat=98% on RA; Wt=183# (down 6#);  HEENT- neg, mallampati2;  Chest- sl decr BS right base, few rhonchi, w/o w/r/consolidation;  Heart- RR Gr1/6SEM w/o r/g;  Abd- soft, neg;  Ext- neg w/o c/c/e... IMP/PLAN>>  BillyWyattmproved but it's been a slow process; he is at high risk for cancer progression & followed closely by DrMohamed who plans f/u CT & recheck in June;  Continue current meds, add Guaifenesin '2400mg'$ /d + water to aid in mucus production... We will recheck in 66mo &17moiew his f/u scans planned by Oncology.    Past Medical History:  Diagnosis Date  . AAA (abdominal aortic aneurysm) (HCC) [place]   4.4 cm 08/2008;4.44 in 7/10 and 4.65 in 08/2009; 4.8 by CT in 11/2009; 4.3 by ultrasound in 08/2010  . Arteriosclerotic cardiovascular disease (ASCVD) 1996   CABG-1996  . Arthritis    "fingers" (03/18/2014)  . CAD (coronary artery disease)    03/18/14:  PCI with DES to distal left main. 7/29: DES to the SVG to Diag  . Cancer (HCC)  [place] right lobe lung cancer  . Cardiomyopathy, ischemic    Echo 03/17/14: EF 45-50%  . Chronic bronchitis (HCC)  [place]   . Colonic polyp 2002   polypectomy in 2002  . COPD (chronic obstructive pulmonary disease) (HCC)  [place]   . ED (erectile dysfunction)   . Encounter for antineoplastic chemotherapy 12/19/2015  . GERD  (gastroesophageal reflux disease)   . Hyperlipidemia   . Hypertension   . IFG (impaired fasting glucose)   . [facility]  .  Pneumonia ~ 2001; ~ 2005  . Right bundle branch block   . Tobacco abuse, in remission    40 pack year total consumption; discontinued in 1996    Past Surgical History:  Procedure Laterality Date  . ABDOMINAL AORTIC ANEURYSM REPAIR  11/2012  . ABDOMINAL AORTIC ENDOVASCULAR STENT GRAFT N/A 12/11/2012   Procedure: ABDOMINAL AORTIC ENDOVASCULAR STENT GRAFT;  Surgeon: Mal Misty, MD;  Location: [place];  Service: Vascular;  Laterality: N/A;  Ultrasound guided; [place]  . CARDIAC CATHETERIZATION  01/08/1995  . COLONOSCOPY  2002   polypectomy-patient denies  . CORONARY ANGIOPLASTY WITH STENT PLACEMENT  03/18/2014   "1"  . CORONARY ANGIOPLASTY WITH STENT PLACEMENT  03/24/2014   "1"  . CORONARY ARTERY BYPASS GRAFT  01/09/1995   "CABG X3"  . JOINT REPLACEMENT    . LAPAROSCOPIC CHOLECYSTECTOMY  12/2009  . LEFT AND RIGHT HEART CATHETERIZATION WITH CORONARY/GRAFT ANGIOGRAM N/A 03/18/2014   Procedure: LEFT AND RIGHT HEART CATHETERIZATION WITH Beatrix Fetters;  Surgeon: Blane Ohara, MD;  Location: [facility] CATH LAB;  Service: Cardiovascular;  Laterality: N/A;  . PERCUTANEOUS CORONARY STENT INTERVENTION (PCI-S)  03/18/2014   Procedure: PERCUTANEOUS CORONARY STENT INTERVENTION (PCI-S);  Surgeon: Blane Ohara, MD;  Location: [facility] CATH LAB;  Service: Cardiovascular;;  . PERCUTANEOUS CORONARY STENT INTERVENTION (PCI-S) N/A 03/24/2014   Procedure: PERCUTANEOUS CORONARY STENT INTERVENTION (PCI-S);  Surgeon: Blane Ohara, MD;  Location: [facility] CATH LAB;  Service: Cardiovascular;  Laterality: N/A;  . TOTAL HIP ARTHROPLASTY Left 01/21/2013   Procedure: TOTAL HIP ARTHROPLASTY ANTERIOR APPROACH;  Surgeon: Mauri Pole, MD;  Location: [place];  Service: Orthopedics;  Laterality: Left;  Marland Kitchen VIDEO BRONCHOSCOPY N/A 11/17/2015    Procedure: VIDEO BRONCHOSCOPY WITH FLUORO;  Surgeon: Rigoberto Noel, MD;  Location: [place];  Service: Cardiopulmonary;  Laterality: N/A;    Outpatient Encounter Prescriptions as of 01/02/2017  Medication Sig  . acetaminophen (TYLENOL) 500 MG tablet Take 500 mg by mouth every 6 (six) hours as needed for mild pain.  Marland Kitchen aspirin EC 81 MG tablet Take 81 mg by mouth daily.  . budesonide-formoterol (SYMBICORT) 160-4.5 MCG/ACT inhaler Inhale 2 puffs into the lungs 2 (two) times daily.  . clopidogrel (PLAVIX) 75 MG tablet Take 1 tablet (75 mg total) by mouth daily. Reported on 12/02/2015  . Ferrous Sulfate (IRON) 28 MG TABS Take 28 mg by mouth daily.  . furosemide (LASIX) 40 MG tablet Take 1 tablet (40 mg total) by mouth daily.  . Metoprolol Tartrate 75 MG TABS Take 1 tablet by mouth 2 (two) times daily.  . Multiple Vitamins-Minerals (CENTRUM SILVER ADULT 50+) TABS Take 1 tablet by mouth daily.   . nitroGLYCERIN (NITROSTAT) 0.4 MG SL tablet Place 1 tablet (0.4 mg total) under the tongue every 5 (five) minutes as needed for chest pain.  Marland Kitchen omeprazole (PRILOSEC) 20 MG capsule TAKE ONE CAPSULE BY MOUTH DAILY.  . simvastatin (ZOCOR) 80 MG tablet Take 0.5 tablets (40 mg total) by mouth at bedtime.  . VENTOLIN HFA 108 (90 Base) MCG/ACT inhaler INHALE 2 PUFFS BY MOUTH EVERY 4 TO 6 HOURS AS NEEDED FOR WHEEZING.  . [DISCONTINUED] doxycycline (VIBRA-TABS) 100 MG tablet Take 1 tablet (100 mg total) by mouth 2 (two) times daily. (Patient not taking: Reported on 01/02/2017)  . [DISCONTINUED] levofloxacin (LEVAQUIN) 500 MG tablet Take 1 tablet (500 mg total) by mouth daily. (Patient not taking: Reported on 01/02/2017)  . [DISCONTINUED] methylPREDNISolone (MEDROL) 4 MG TBPK tablet Take as directed (Patient not taking:  Reported on 01/02/2017)   No facility-administered encounter medications on file as of 01/02/2017.     Allergies  Allergen Reactions  . Neomycin Hives    Immunization History  Administered Date(s)  Administered  . H1N1 08/06/2008  . Influenza Split 06/24/2013  . Influenza,inj,Quad PF,36+ Mos 06/15/2014, 05/31/2015, 05/09/2016  . Influenza-Unspecified 05/27/2012  . Pneumococcal Conjugate-13 08/18/2014  . Pneumococcal Polysaccharide-23 03/27/2012  . Zoster 07/27/2008    Current Medications, Allergies, Past Medical History, Past Surgical History, Family History, and Social History were reviewed in [employer] record.   Review of Systems             All symptoms NEG except where BOLDED >>  Constitutional:  F/C/S, fatigue, anorexia, unexpected weight change. HEENT:  HA, visual changes, hearing loss, earache, nasal symptoms, sore throat, mouth sores, hoarseness. Resp:  cough, sputum, hemoptysis; SOB, tightness, wheezing. Cardio:  CP, palpit, DOE, orthopnea, edema. GI:  N/V/D/C, blood in stool; reflux, abd pain, distention, gas. GU:  dysuria, freq, urgency, hematuria, flank pain, voiding difficulty. MS:  joint pain, swelling, tenderness, decr ROM; neck pain, back pain, etc. Neuro:  HA, tremors, seizures, dizziness, syncope, weakness, numbness, gait abn. Skin:  suspicious lesions or skin rash. Heme:  adenopathy, bruising, bleeding. Psyche:  confusion, agitation, sleep disturbance, hallucinations, anxiety, depression suicidal.   Objective:   Physical Exam       Vital Signs:  Reviewed...   General:  WD, WN, 76 y/o WM in NAD; alert & oriented; pleasant & cooperative... HEENT:  /AT; Conjunctiva- pink, Sclera- nonicteric, EOM-wnl, PERRLA, EACs-clear, TMs-wnl; NOSE-clear; THROAT-clear & wnl.  Neck:  Supple w/ fair ROM; no JVD; normal carotid impulses w/ faint bruits; no thyromegaly or nodules palpated; no lymphadenopathy.  Chest:  decr BS right base & clear w/o w/r/r & no signs of consolid. Heart:  Regular Rhythm; norm S1 & S2 w/ Gr1/6 SEM without rubs or gallops detected. Abdomen:  Soft & nontender- no guarding or rebound; normal bowel sounds; no organomegaly  or masses palpated. Ext:  Sl decr ROM; without deformities +arthritic changes; no varicose veins, +venous insuffic, tr edema;  Pulses decr w/o bruits. Neuro:  CNs II-XII intact; motor testing normal; sensory testing normal; gait normal & balance OK. Derm:  No lesions noted; no rash etc. Lymph:  No cervical, supraclavicular, axillary, or inguinal adenopathy palpated.   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     ASPVD- s/p AAA stent graft 11/2012, mod bilat carotid occlusive dis (asymptomatic) w/ CDopplers showing ~50-60% bilat ICAstenoses>  Stable & seen by [facility] 01/2016- plans yearly f/u CT Abd and CDopplers    MEDICAL issues>  HBP, HL, GERD/ Divertic/ Polyps, CKD-stage3, Anemia & thrombocytopenia>  On Simva80-1/2Qd, Prilosec20/d, Fe/ MVI/ etc...  PLAN>>  02/29/16>   Problem list as above w/ signif cardiac, pulmonary, & post chemoradiation changes;  From the pulm  standpoint- he feels the BREO is helping & hasn't needed the rescue inhaler very often, continue same;  He is on a long PRED taper per DrManning for poss radiation fibrosis RUL, Sed rate is 53, continue this taper & careful w/ sweets/ sugar etc;  From the cardiac standpoint- he has signif underlying dis w/ Lmain stent, s/p CABG 20 yrs ago, prob ischemic cardiomyopathy w/ worsening 2DEcho recently w/ 30-35% EF + HK&AK seen,  BNP=1053, Cards plans ischemic work up when able, in the meanwhile he has improved w/ diuresis but Chems w/ mild RI & HCO3=36... REC- add DIAMOX-ER 500 one tab daily at 4pm, continue his Lasix40 Qam, increase water intake, watch weights... We plan ROV 66monthrecheck... 04/04/16>   Discussed w/ Paul Sandoval-- continue the Levaquin til gone, use Tylenol for fever or pain; continue the Breo daily & Ventolin-HFA as needed; wean off the Diamox at this time, continue Lasix40, no salt, etc; he needs to gradually increase his exercise/ mobility; we will plan ROV w/ labs in 2653mo 06/06/16>   He is feeling better, performance status improving, CXR shows progressive changes in RUL s/p chemoradiation; CT=>PET scans w/ vol loss & persistent hypermetabolism/ evolving changes in RUL & right hilum plus he has a mod large right effusion; prev thoracentesis 01/2016 in hosp was transudative- I believe he would benefit from further eval/ repeat thoracentesis to recheck this fluid; we will set this up via IR & ask them to drain the fluid as much as poss, send it for Cytology, cell ct & diff, TProt/ LDH/ Gluc => 1.2L removed, prob exudate, NEG cytology, & we will follow... 08/06/16>   BiSergis clinically stable, no clear evid of recurrent dis & followed very closely by DrMohamed; Pulm stable on Symbicort Bid, Albut rescue prn, and his exercise- continue same 7 we plan rov recheck in [id number]/18>    BilArmanii doing satis on Symbicort160-2spBid & Albut rescue inhaler prn; he states that he is active, energy is good, & that he's getting stronger & better (good performance status); I suspect that he has residual cancer in the right chest but it appears slowly progressive (w/ sl incr right effusion & subcarinal LN) and DrMohamed to review status & decide regarding additional therapy at this point vs continued observation...  01/02/17>   BilAlejandro improved but it's been a slow process; he is at high risk for cancer progression & followed closely by  DrMohamed who plans f/u CT & recheck in June;  Continue current meds, add Guaifenesin '2400mg'$ /d + water to aid in mucus production... We will recheck in 72mo553moeview his f/u scans planned by Oncology.     Plan:     Patient's Medications  New Prescriptions   No medications on file  Previous Medications   ACETAMINOPHEN (TYLENOL) 500 MG TABLET    Take 500 mg by mouth every 6 (six) hours as needed for mild pain.   ASPIRIN EC 81 MG TABLET    Take 81 mg by mouth daily.   BUDESONIDE-FORMOTEROL (SYMBICORT) 160-4.5 MCG/ACT INHALER    Inhale 2 puffs into the lungs 2 (two) times daily.   CLOPIDOGREL (PLAVIX) 75 MG TABLET    Take 1 tablet (75 mg total) by mouth daily. Reported on 12/02/2015   FERROUS SULFATE (IRON) 28 MG TABS    Take 28 mg by mouth daily.   FUROSEMIDE (LASIX) 40 MG TABLET    Take 1 tablet (40 mg total) by mouth daily.   METOPROLOL TARTRATE 75  MG TABS    Take 1 tablet by mouth 2 (two) times daily.   MULTIPLE VITAMINS-MINERALS (CENTRUM SILVER ADULT 50+) TABS    Take 1 tablet by mouth daily.    NITROGLYCERIN (NITROSTAT) 0.4 MG SL TABLET    Place 1 tablet (0.4 mg total) under the tongue every 5 (five) minutes as needed for chest pain.   OMEPRAZOLE (PRILOSEC) 20 MG CAPSULE    TAKE ONE CAPSULE BY MOUTH DAILY.   SIMVASTATIN (ZOCOR) 80 MG TABLET    Take 0.5 tablets (40 mg total) by mouth at bedtime.   VENTOLIN HFA 108 (90 BASE) MCG/ACT INHALER    INHALE 2 PUFFS BY MOUTH EVERY 4 TO 6 HOURS AS NEEDED FOR WHEEZING.  Modified Medications   No medications on file  Discontinued Medications   DOXYCYCLINE (VIBRA-TABS) 100 MG TABLET    Take 1 tablet (100 mg total) by mouth 2 (two) times daily.   LEVOFLOXACIN (LEVAQUIN) 500 MG TABLET    Take 1 tablet (500 mg total) by mouth daily.   METHYLPREDNISOLONE (MEDROL) 4 MG TBPK TABLET    Take as directed

## 2017-01-02 NOTE — Patient Instructions (Signed)
Today we updated your med list in our EPIC system...    Continue your current medications the same...  For the congestion>>    Start on the OTC MUCINEX taking '1200mg'$  twice daily (eg- 2 of the '600mg'$  tabs twice daily or one of the '1200mg'$  tabs twice daily)... You can also save some $$ with the generic GUAIFENESIN tabs- they are '400mg'$  each & you would need 2 tabs 3 times daily... Be sure to drink lots of water w/ each dose...  Call for any questions...  Let's plan a follow up visit in [place], sooner if needed for problems.Marland KitchenMarland Kitchen

## 2017-01-05 ENCOUNTER — Encounter (HOSPITAL_COMMUNITY): Payer: Self-pay | Admitting: Emergency Medicine

## 2017-01-05 ENCOUNTER — Emergency Department (HOSPITAL_COMMUNITY)
Admission: EM | Admit: 2017-01-05 | Discharge: 2017-01-05 | Disposition: A | Discharge status: Home / Self Care | Payer: Medicare Other | Attending: Emergency Medicine | Admitting: Emergency Medicine

## 2017-01-05 DIAGNOSIS — I2510 Atherosclerotic heart disease of native coronary artery without angina pectoris: Secondary | ICD-10-CM | POA: Insufficient documentation

## 2017-01-05 DIAGNOSIS — I252 Old myocardial infarction: Secondary | ICD-10-CM | POA: Insufficient documentation

## 2017-01-05 DIAGNOSIS — Z7982 Long term (current) use of aspirin: Secondary | ICD-10-CM | POA: Diagnosis not present

## 2017-01-05 DIAGNOSIS — Z951 Presence of aortocoronary bypass graft: Secondary | ICD-10-CM | POA: Diagnosis not present

## 2017-01-05 DIAGNOSIS — I130 Hypertensive heart and chronic kidney disease with heart failure and stage 1 through stage 4 chronic kidney disease, or unspecified chronic kidney disease: Secondary | ICD-10-CM | POA: Insufficient documentation

## 2017-01-05 DIAGNOSIS — Z85118 Personal history of other malignant neoplasm of bronchus and lung: Secondary | ICD-10-CM | POA: Diagnosis not present

## 2017-01-05 DIAGNOSIS — I5042 Chronic combined systolic (congestive) and diastolic (congestive) heart failure: Secondary | ICD-10-CM | POA: Diagnosis not present

## 2017-01-05 DIAGNOSIS — K2900 Acute gastritis without bleeding: Secondary | ICD-10-CM | POA: Diagnosis not present

## 2017-01-05 DIAGNOSIS — Z955 Presence of coronary angioplasty implant and graft: Secondary | ICD-10-CM | POA: Diagnosis not present

## 2017-01-05 DIAGNOSIS — J449 Chronic obstructive pulmonary disease, unspecified: Secondary | ICD-10-CM | POA: Diagnosis not present

## 2017-01-05 DIAGNOSIS — Z87891 Personal history of nicotine dependence: Secondary | ICD-10-CM | POA: Diagnosis not present

## 2017-01-05 DIAGNOSIS — N183 Chronic kidney disease, stage 3 (moderate): Secondary | ICD-10-CM | POA: Insufficient documentation

## 2017-01-05 DIAGNOSIS — R112 Nausea with vomiting, unspecified: Secondary | ICD-10-CM | POA: Diagnosis present

## 2017-01-05 LAB — COMPREHENSIVE METABOLIC PANEL
ALBUMIN: 3.5 g/dL (ref 3.5–5.0)
ALK PHOS: 77 U/L (ref 38–126)
ALT: 15 U/L — ABNORMAL LOW (ref 17–63)
ANION GAP: 10 (ref 5–15)
AST: 26 U/L (ref 15–41)
BILIRUBIN TOTAL: 0.9 mg/dL (ref 0.3–1.2)
BUN: 17 mg/dL (ref 6–20)
CALCIUM: 8.9 mg/dL (ref 8.9–10.3)
CO2: 29 mmol/L (ref 22–32)
Chloride: 96 mmol/L — ABNORMAL LOW (ref 101–111)
Creatinine, Ser: 1.54 mg/dL — ABNORMAL HIGH (ref 0.61–1.24)
GFR calc Af Amer: 48 mL/min — ABNORMAL LOW (ref >60)
GFR, EST NON AFRICAN AMERICAN: 42 mL/min — AB (ref >60)
GLUCOSE: 138 mg/dL — AB (ref 65–99)
Potassium: 4.2 mmol/L (ref 3.5–5.1)
Sodium: 135 mmol/L (ref 135–145)
TOTAL PROTEIN: 8 g/dL (ref 6.5–8.1)

## 2017-01-05 LAB — CBC
HEMATOCRIT: 38 % — AB (ref 39.0–52.0)
Hemoglobin: 12.1 g/dL — ABNORMAL LOW (ref 13.0–17.0)
MCH: 27.3 pg (ref 26.0–34.0)
MCHC: 31.8 g/dL (ref 30.0–36.0)
MCV: 85.8 fL (ref 78.0–100.0)
Platelets: 177 10*3/uL (ref 150–400)
RBC: 4.43 MIL/uL (ref 4.22–5.81)
RDW: 16.6 % — AB (ref 11.5–15.5)
WBC: 9.6 10*3/uL (ref 4.0–10.5)

## 2017-01-05 LAB — URINALYSIS, ROUTINE W REFLEX MICROSCOPIC
BACTERIA UA: NONE SEEN
Bilirubin Urine: NEGATIVE
GLUCOSE, UA: NEGATIVE mg/dL
Hgb urine dipstick: NEGATIVE
Ketones, ur: 5 mg/dL — AB
Leukocytes, UA: NEGATIVE
NITRITE: NEGATIVE
PROTEIN: 100 mg/dL — AB
Specific Gravity, Urine: 1.025 (ref 1.005–1.030)
Squamous Epithelial / LPF: NONE SEEN
pH: 5 (ref 5.0–8.0)

## 2017-01-05 LAB — LIPASE, BLOOD: Lipase: 15 U/L (ref 11–51)

## 2017-01-05 MED ORDER — ONDANSETRON HCL 4 MG/2ML IJ SOLN
4.0000 mg | Freq: Once | INTRAMUSCULAR | Status: AC
  Administered 2017-01-05: 4 mg via INTRAVENOUS
  Filled 2017-01-05: qty 2

## 2017-01-05 MED ORDER — SODIUM CHLORIDE 0.9 % IV BOLUS (SEPSIS)
1000.0000 mL | Freq: Once | INTRAVENOUS | Status: AC
  Administered 2017-01-05: 1000 mL via INTRAVENOUS

## 2017-01-05 MED ORDER — ONDANSETRON 4 MG PO TBDP: 4.0000 mg | ORAL_TABLET | Freq: Three times a day (TID) | ORAL | 0 refills | Status: DC | PRN

## 2017-01-05 NOTE — ED Provider Notes (Signed)
[facility] DEPT Provider Note   CSN: [id number] Arrival date & time: 01/05/17  0841     History   Chief Complaint Chief Complaint  Patient presents with  . Emesis  . Diarrhea    HPI Paul Sandoval is a 76 y.o. male.  HPI  76 y.o. male with a hx of CAD (s/p CABG 1996, LHC 02/2014 s/p DES to dLM & staged PCI of SVG-diag), HTN, HLD, COPD, CKD stage III, NSCLC stage IIIA dx 10/2015 (T2b, N2, M0 - tx radiation, carboplatin, paclitaxel), presents to the Emergency Department today complaining of N/V/D with onset yesterday 1600. Notes minimal diarrhea since this morning, but emesis has persisted, but is also slowly resolving. States that he has had 6 bouts of emesis since then. Last emesis this morning around 0600. None since being in ED. Denies CP/SOB/ABD pain. No headaches. No numbness/tingling. No fevers. Notes cough, but this appears chronic. States that on Thursday evening he ate some Shrimp that was unsettling to the stomach and began having symptoms the proceeding Friday. Denies sick contacts. No other symptoms noted.   Past Medical History:  Diagnosis Date  . AAA (abdominal aortic aneurysm) ([place]) 2010   4.4 cm 08/2008;4.44 in 7/10 and 4.65 in 08/2009; 4.8 by CT in 11/2009; 4.3 by ultrasound in 08/2010  . Arteriosclerotic cardiovascular disease (ASCVD) 1996   CABG-1996  . Arthritis    "fingers" (03/18/2014)  . CAD (coronary artery disease)    03/18/14:  PCI with DES to distal left main. 7/29: DES to the SVG to Diag  . Cancer ([place])    Upper right lobe lung cancer  . Cardiomyopathy, ischemic    Echo 03/17/14: EF 45-50%  . Chronic bronchitis ([place])   . Chronic rhinitis   . Colonic polyp 2002   polypectomy in 2002  . COPD (chronic obstructive pulmonary disease) ([place])   . Diverticulosis   . ED (erectile dysfunction)   . Encounter for antineoplastic chemotherapy 12/19/2015  . GERD (gastroesophageal reflux disease)   . Hyperlipidemia   . Hypertension   . IFG (impaired fasting  glucose)   . Myocardial infarction [facility])    "told h/o silent MI sometime before 1996"  . Pneumonia ~ 2001; ~ 2005  . Right bundle branch block   . Tobacco abuse, in remission    40 pack year total consumption; discontinued in 1996    Patient Active Problem List   Diagnosis Date Noted  . History of endovascular stent graft for abdominal aortic aneurysm (AAA) 01/02/2017  . S/P CABG (coronary artery bypass graft) 11/05/2016  . Cardiomyopathy, ischemic 11/05/2016  . Chronic combined systolic and diastolic congestive heart failure ([place]) 08/06/2016  . Sinus tachycardia 02/24/2016  . Pleural effusion   . SOB (shortness of breath)   . Acute on chronic combined systolic and diastolic heart failure ([place])   . Coronary artery disease due to lipid rich plaque   . Hypoalbuminemia due to protein-calorie malnutrition ([place]) 02/21/2016  . Fever, unspecified 02/21/2016  . Peripheral edema 02/17/2016  . Acute on chronic renal failure ([place]) 02/05/2016  . HTN (hypertension) 02/05/2016  . AAA (abdominal aortic aneurysm) without rupture ([place]) 01/30/2016  . Primary cancer of right upper lobe of lung ([place]) 11/24/2015  . CKD (chronic kidney disease) stage 3, GFR 30-59 ml/min 11/11/2015  . Thrombocytopenia ([place]) 02/17/2015  . Chronic obstructive airway disease with asthma ([place]) 02/17/2015  . Other and unspecified angina pectoris 03/18/2014  . 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 Right carotid bruit 07/21/2013  .  CAD (coronary artery disease) 01/19/2013  . AAA (abdominal aortic aneurysm) ([place]) 05/06/2012  . Cerebrovascular disease 12/18/2011  . GERD (gastroesophageal reflux disease)   . Arteriosclerotic cardiovascular disease (ASCVD) 12/06/2009  . Elevated lipids 12/05/2009  . Essential hypertension 12/05/2009    Past Surgical History:  Procedure Laterality Date  . ABDOMINAL AORTIC ANEURYSM REPAIR  11/2012  . ABDOMINAL AORTIC ENDOVASCULAR STENT GRAFT N/A 12/11/2012   Procedure: ABDOMINAL  AORTIC ENDOVASCULAR STENT GRAFT;  Surgeon: Mal Misty, MD;  Location: [facility];  Service: Vascular;  Laterality: N/A;  Ultrasound guided; [place]  . CARDIAC CATHETERIZATION  01/08/1995  . COLONOSCOPY  2002   polypectomy-patient denies  . CORONARY ANGIOPLASTY WITH STENT PLACEMENT  03/18/2014   "1"  . CORONARY ANGIOPLASTY WITH STENT PLACEMENT  03/24/2014   "1"  . CORONARY ARTERY BYPASS GRAFT  01/09/1995   "CABG X3"  . JOINT REPLACEMENT    . LAPAROSCOPIC CHOLECYSTECTOMY  12/2009  . LEFT AND RIGHT HEART CATHETERIZATION WITH CORONARY/GRAFT ANGIOGRAM N/A 03/18/2014   Procedure: LEFT AND RIGHT HEART CATHETERIZATION WITH Beatrix Fetters;  Surgeon: Blane Ohara, MD;  Location: [facility] CATH LAB;  Service: Cardiovascular;  Laterality: N/A;  . PERCUTANEOUS CORONARY STENT INTERVENTION (PCI-S)  03/18/2014   Procedure: PERCUTANEOUS CORONARY STENT INTERVENTION (PCI-S);  Surgeon: Blane Ohara, MD;  Location: [facility] CATH LAB;  Service: Cardiovascular;;  . PERCUTANEOUS CORONARY STENT INTERVENTION (PCI-S) N/A 03/24/2014   Procedure: PERCUTANEOUS CORONARY STENT INTERVENTION (PCI-S);  Surgeon: Blane Ohara, MD;  Location: [facility] CATH LAB;  Service: Cardiovascular;  Laterality: N/A;  . TOTAL HIP ARTHROPLASTY Left 01/21/2013   Procedure: TOTAL HIP ARTHROPLASTY ANTERIOR APPROACH;  Surgeon: Mauri Pole, MD;  Location: [facility];  Service: Orthopedics;  Laterality: Left;  Marland Kitchen VIDEO BRONCHOSCOPY N/A 11/17/2015   Procedure: VIDEO BRONCHOSCOPY WITH FLUORO;  Surgeon: Rigoberto Noel, MD;  Location: [facility];  Service: Cardiopulmonary;  Laterality: N/A;       Home Medications    Prior to Admission medications   Medication Sig Start Date End Date Taking? Authorizing Provider  acetaminophen (TYLENOL) 500 MG tablet Take 500 mg by mouth every 6 (six) hours as needed for mild pain.    [provider]  aspirin EC 81 MG tablet Take 81 mg by mouth daily.    [provider]  budesonide-formoterol (SYMBICORT)  160-4.5 MCG/ACT inhaler Inhale 2 puffs into the lungs 2 (two) times daily.    [provider]  clopidogrel (PLAVIX) 75 MG tablet Take 1 tablet (75 mg total) by mouth daily. Reported on 12/02/2015 01/26/16   Josue Hector, MD  Ferrous Sulfate (IRON) 28 MG TABS Take 28 mg by mouth daily.    [provider]  furosemide (LASIX) 40 MG tablet Take 1 tablet (40 mg total) by mouth daily. 03/19/16   [name], MD  Metoprolol Tartrate 75 MG TABS Take 1 tablet by mouth 2 (two) times daily. 05/09/16   [name], MD  Multiple Vitamins-Minerals (CENTRUM SILVER ADULT 50+) TABS Take 1 tablet by mouth daily.     [provider]  nitroGLYCERIN (NITROSTAT) 0.4 MG SL tablet Place 1 tablet (0.4 mg total) under the tongue every 5 (five) minutes as needed for chest pain. 03/23/15   Josue Hector, MD  omeprazole (PRILOSEC) 20 MG capsule TAKE ONE CAPSULE BY MOUTH DAILY. 10/17/16   [name], MD  simvastatin (ZOCOR) 80 MG tablet Take 0.5 tablets (40 mg total) by mouth at bedtime. 08/23/15   Baltazar Apo  S, MD  VENTOLIN HFA 108 (90 Base) MCG/ACT inhaler INHALE 2 PUFFS BY MOUTH EVERY 4 TO 6 HOURS AS NEEDED FOR WHEEZING. 09/02/15   [name], MD    Family History Family History  Problem Relation Age of Onset  . Heart disease Father   . Cancer Father        Lung  . Arthritis Mother   . Parkinsonism Mother   . Arthritis Sister        Brother with rheumatoid arthritis  . Hypertension Brother   . Colon cancer Neg Hx   . Colon polyps Neg Hx     Social History Social History  Substance Use Topics  . Smoking status: Former Smoker    Packs/day: 1.50    Years: 30.00    Types: Cigarettes    Start date: 12/01/1956    Quit date: 01/08/1995  . Smokeless tobacco: Never Used  . Alcohol use 0.0 oz/week     Comment: 03/18/2014 "no alacohol since 1996"     Allergies   Neomycin   Review of Systems Review of Systems ROS reviewed and all are negative for acute change  except as noted in the HPI.  Physical Exam Updated Vital Signs BP 136/67 (BP Location: Left Arm)   Pulse (!) 102   Temp 97.6 F (36.4 C) (Oral)   Resp 18   Ht '5\' 10"'$  (1.778 m)   Wt 81.6 kg   SpO2 97%   BMI 25.83 kg/m   Physical Exam  Constitutional: He is oriented to person, place, and time. Vital signs are normal. He appears well-developed and well-nourished.  NAD. No active emesis  HENT:  Head: Normocephalic and atraumatic.  Right Ear: Hearing normal.  Left Ear: Hearing normal.  Eyes: Conjunctivae and EOM are normal. Pupils are equal, round, and reactive to light.  Neck: Normal range of motion. Neck supple.  Cardiovascular: Normal rate, regular rhythm, normal heart sounds and intact distal pulses.   Pulmonary/Chest: Effort normal and breath sounds normal. No respiratory distress. He has no wheezes. He has no rales. He exhibits no tenderness.  Abdominal: Soft. Normal appearance and bowel sounds are normal. There is no tenderness. There is no rigidity, no rebound, no guarding, no CVA tenderness, no tenderness at McBurney's point and negative Murphy's sign.  Musculoskeletal: Normal range of motion.  Neurological: He is alert and oriented to person, place, and time.  Skin: Skin is warm and dry.  Psychiatric: He has a normal mood and affect. His speech is normal and behavior is normal. Thought content normal.  Nursing note and vitals reviewed.  ED Treatments / Results  Labs (all labs ordered are listed, but only abnormal results are displayed) Labs Reviewed  COMPREHENSIVE METABOLIC PANEL - Abnormal; Notable for the following:       Result Value   Chloride 96 (*)    Glucose, Bld 138 (*)    Creatinine, Ser 1.54 (*)    ALT 15 (*)    GFR calc non Af Amer 42 (*)    GFR calc Af Amer 48 (*)    All other components within normal limits  CBC - Abnormal; Notable for the following:    Hemoglobin 12.1 (*)    HCT 38.0 (*)    RDW 16.6 (*)    All other components within normal limits    URINALYSIS, ROUTINE W REFLEX MICROSCOPIC - Abnormal; Notable for the following:    Ketones, ur 5 (*)    Protein, ur 100 (*)  All other components within normal limits  LIPASE, BLOOD    EKG  EKG Interpretation None       Radiology No results found.  Procedures Procedures (including critical care time)  Medications Ordered in ED Medications  sodium chloride 0.9 % bolus 1,000 mL (0 mLs Intravenous Stopped 01/05/17 1154)  ondansetron (ZOFRAN) injection 4 mg (4 mg Intravenous Given 01/05/17 0928)   Initial Impression / Assessment and Plan / ED Course  I have reviewed the triage vital signs and the nursing notes.  Pertinent labs & imaging results that were available during my care of the patient were reviewed by me and considered in my medical decision making (see chart for details).  Final Clinical Impressions(s) / ED Diagnoses  {I have reviewed and evaluated the relevant laboratory values.   {I have reviewed the relevant previous healthcare records.  {I obtained HPI from historian. {Patient discussed with supervising physician.  ED Course:  Assessment: Pt is a 76 y.o. male with hx CAD (s/p CABG 1996, LHC 02/2014 s/p DES to dLM & staged PCI of SVG-diag), HTN, HLD, COPD, CKD stage III, NSCLC stage IIIA dx 10/2015 (T2b, N2, M0 - tx radiation, carboplatin, paclitaxel) who presents with N/V/D since yesterday. No CP/SOB/ABD pain. No diaphoresis. Noted intake of shrimp on Thursday before symptoms began. Symptoms are resolving, at least the diarrhea is, but emesis mildly persistent. Last emesis around 0600. No hematemesis. On exam, pt in NAD. Nontoxic/nonseptic appearing. VSS. Afebrile. Lungs CTA. Heart RRR. Abdomen nontender soft. CBC unremarkable. CMP unremarkable. Lipase negative. UA negative. Given fluids and anti emetics in ED. Likely gastritis related due food. Seen by supervising physician. Plan is to DC home with anti emetics and follow up to PCP. Strict return precautions given. At  time of discharge, Patient is in no acute distress. Vital Signs are stable. Patient is able to ambulate. Patient able to tolerate PO.   Disposition/Plan:  DC Home Additional Verbal discharge instructions given and discussed with patient.  Pt Instructed to f/u with PCP in the next week for evaluation and treatment of symptoms. Return precautions given Pt acknowledges and agrees with plan  Supervising Physician Alfonzo Beers, MD  Final diagnoses:  Acute gastritis without hemorrhage, unspecified gastritis type    New Prescriptions New Prescriptions   No medications on file     Shary Decamp, Hershal Coria 01/05/17 [place], Martha, MD 01/05/17 1241

## 2017-01-05 NOTE — Discharge Instructions (Signed)
Please read and follow all provided instructions.  Your diagnoses today include:  1. Acute gastritis without hemorrhage, unspecified gastritis type     Tests performed today include: Blood counts and electrolytes Blood tests to check liver and kidney function Blood tests to check pancreas function Urine test to look for infection and pregnancy (in women) Vital signs. See below for your results today.   Medications prescribed:   Take any prescribed medications only as directed.  Home care instructions:  Follow any educational materials contained in this packet.  Follow-up instructions: Please follow-up with your primary care provider in the next 2 days for further evaluation of your symptoms.    Return instructions:  SEEK IMMEDIATE MEDICAL ATTENTION IF: The pain does not go away or becomes severe  A temperature above 101F develops  Repeated vomiting occurs (multiple episodes)  The pain becomes localized to portions of the abdomen. The right side could possibly be appendicitis. In an adult, the left lower portion of the abdomen could be colitis or diverticulitis.  Blood is being passed in stools or vomit (bright red or black tarry stools)  You develop chest pain, difficulty breathing, dizziness or fainting, or become confused, poorly responsive, or inconsolable (young children) If you have any other emergent concerns regarding your health  Additional Information: Abdominal (belly) pain can be caused by many things. Your caregiver performed an examination and possibly ordered blood/urine tests and imaging (CT scan, x-rays, ultrasound). Many cases can be observed and treated at home after initial evaluation in the emergency department. Even though you are being discharged home, abdominal pain can be unpredictable. Therefore, you need a repeated exam if your pain does not resolve, returns, or worsens. Most patients with abdominal pain don't have to be admitted to the hospital or have  surgery, but serious problems like appendicitis and gallbladder attacks can start out as nonspecific pain. Many abdominal conditions cannot be diagnosed in one visit, so follow-up evaluations are very important.  Your vital signs today were: BP (!) 116/94 (BP Location: Left Arm)    Pulse 89    Temp 97.9 F (36.6 C) (Oral)    Resp 12    Ht '5\' 10"'$  (1.778 m)    Wt 81.6 kg    SpO2 96%    BMI 25.83 kg/m  If your blood pressure (bp) was elevated above 135/85 this visit, please have this repeated by your doctor within one month. --------------

## 2017-01-05 NOTE — ED Triage Notes (Signed)
Pt complaint of n/v/d onset 1600 yesterday; unable to tolerate sips of water/ginger ale or medications.

## 2017-01-08 IMAGING — CT NM PET TUM IMG INITIAL (PI) SKULL BASE T - THIGH
8 series · 25 of 25 positions shown · non-contrast
Comparison: CT 11/11/2015

CLINICAL DATA: Initial treatment strategy for solitary pulmonary
nodule.

EXAM:
NUCLEAR MEDICINE PET SKULL BASE TO THIGH
TECHNIQUE: 9.2 mCi F-18 FDG was injected intravenously. Full-ring PET imaging
was performed from the skull base to thigh after the radiotracer. CT
data was obtained and used for attenuation correction and anatomic
localization.
FASTING BLOOD GLUCOSE:  Value: 94 mg/dl

[Series 3: pet sk_thigh ac · axial · 5.0mm · 4.07mm/px · z∈[-815,+137]mm · 5 of 239 slices shown]
[im 1/239]
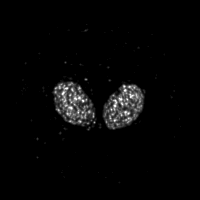
[im 60/239]
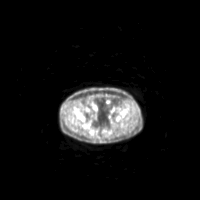
[im 120/239]
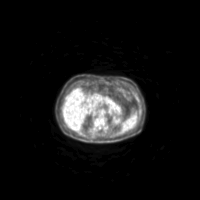
[im 179/239]
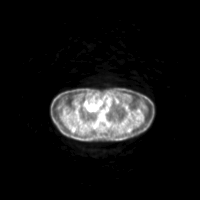
[im 239/239]
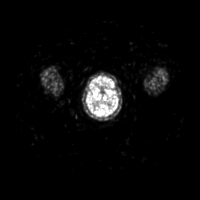

[Series 4: ct sk_thigh 5.0 b31f · axial · 5.0mm · 0.93mm/px · z∈[-815,+137]mm · 5 of 239 slices shown]
[im 1/239]
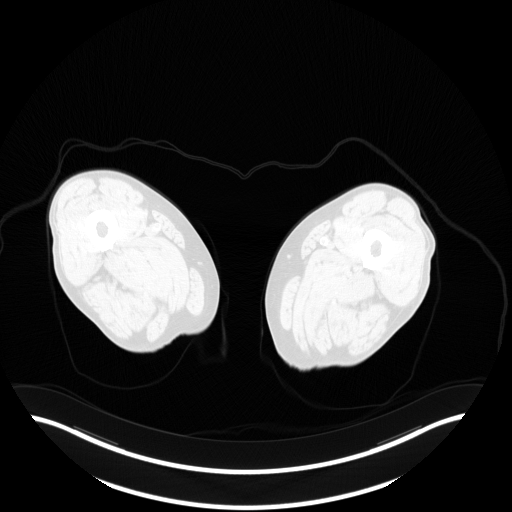
[im 60/239]
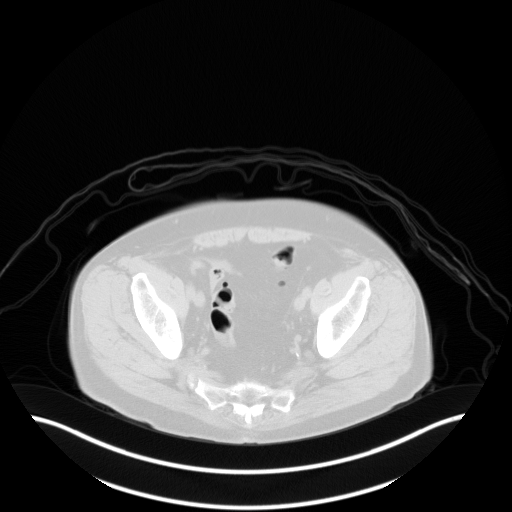
[im 120/239]
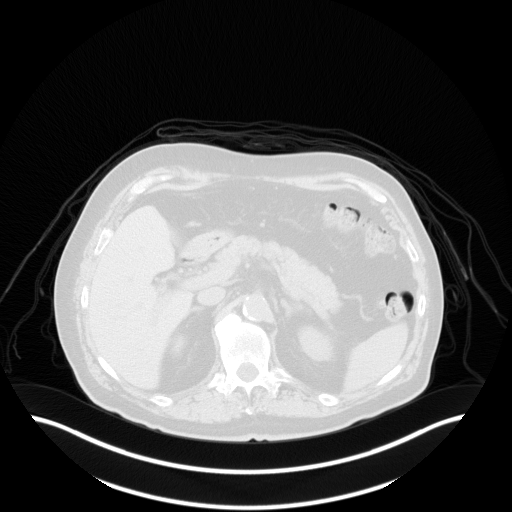
[im 179/239]
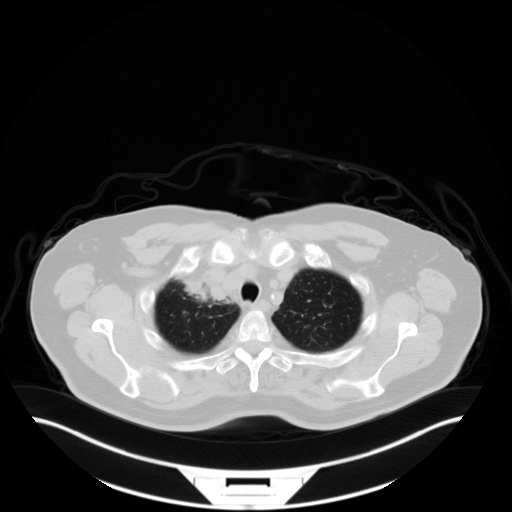
[im 239/239  brain]
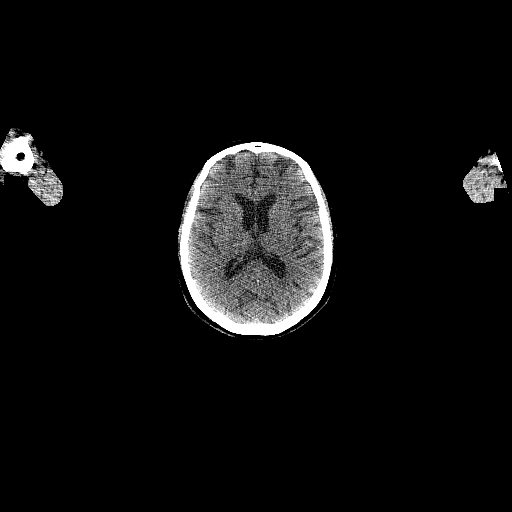

[Series 6: ct sk_thigh 5.0 b70f (id)_bone · axial · 5.0mm · 0.72mm/px · z∈[-341,-41]mm · 2 of 76 slices shown]
[im 1/76  bone]
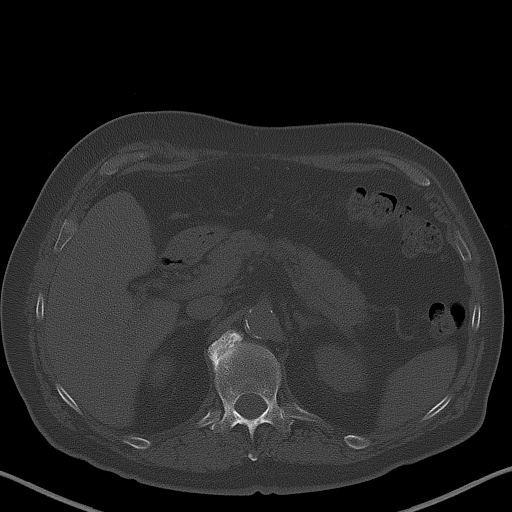
[im 76/76  bone]
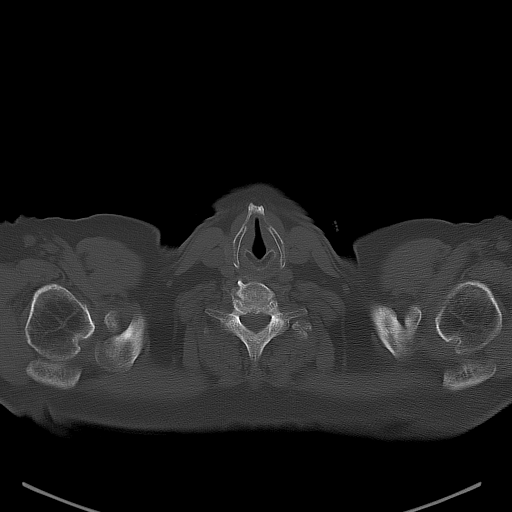

[Series 8: pet sk_thigh nac · axial · 5.0mm · 4.07mm/px · z∈[-815,+137]mm · 5 of 239 slices shown]
[im 1/239]
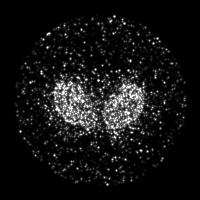
[im 60/239]
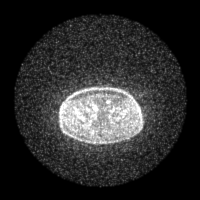
[im 120/239]
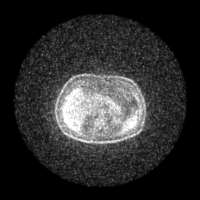
[im 179/239]
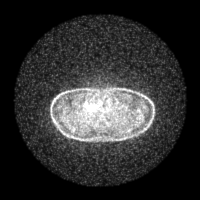
[im 239/239]
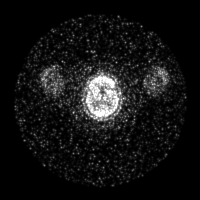

[Series 603: mip collection<mip range> · coronal · 1.98mm/px · 1 of 32 slices shown]
[im 1/32]
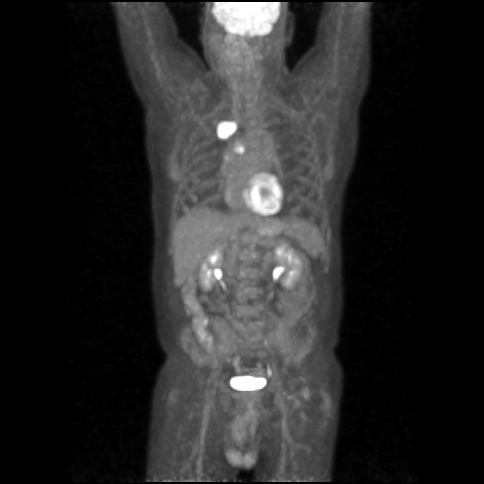

[Series 604: range-ct sk_thigh 5.0 (id)<alpha range> · 1 of 63 slices shown (1 of 2)]
[im 1/63]
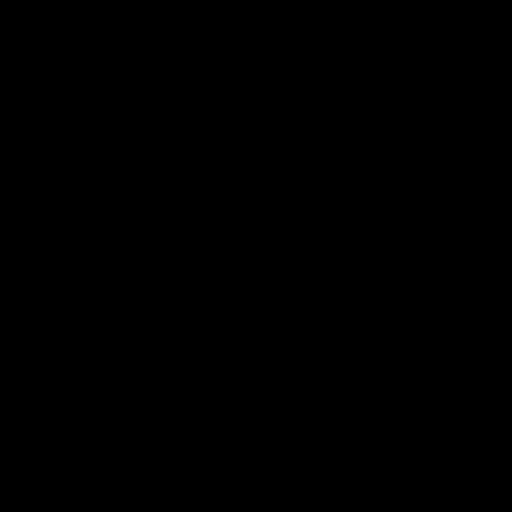

[Series 605: range-ct sk_thigh 5.0 (id)<alpha range> · 5 of 217 slices shown (2 of 2)]
[im 1/217]
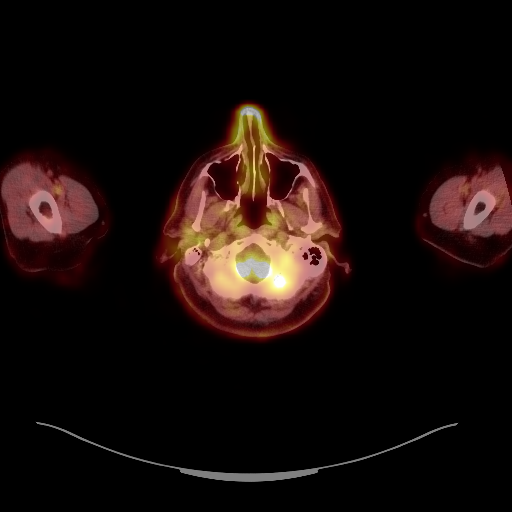
[im 55/217]
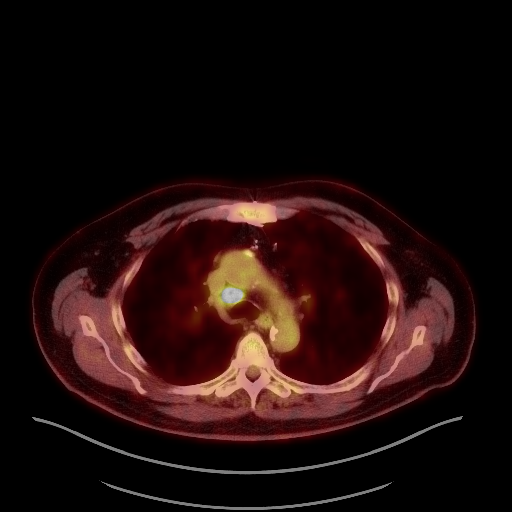
[im 109/217]
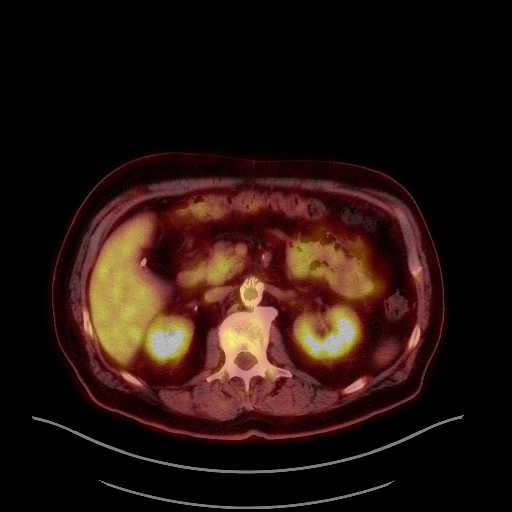
[im 163/217]
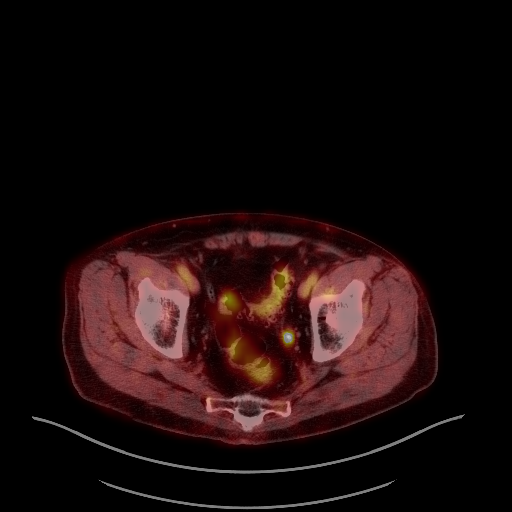
[im 217/217]
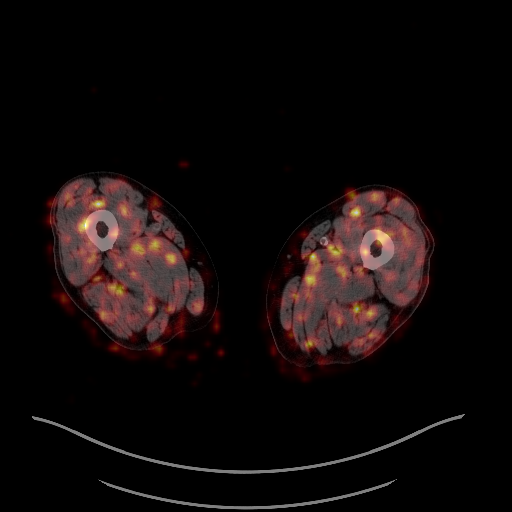

[Series 1032: results mm oncology reading · 0.81mm/px · 1 of 2 slices shown]
[im 1/2]
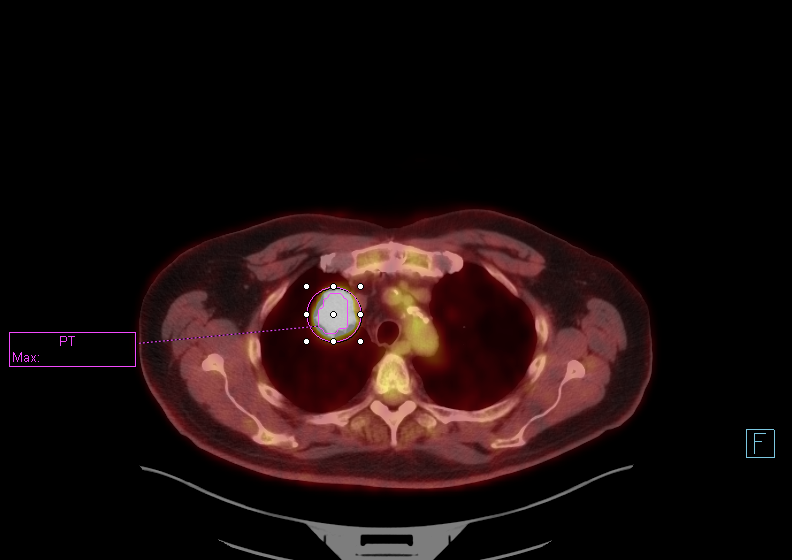

[25 of 25 positions shown; findings below may reference images not displayed]

FINDINGS: NECK

No hypermetabolic lymph nodes in the neck.

CHEST

RIGHT upper lobe mass abutting the mediastinal pleural surface
measures 4.3 x 3.4 cm with intense metabolic activity (SUV max equal
31.9). No additional hypermetabolic nodules.

Small focus of nodular ground-glass density in the RIGHT middle lobe
(image 49, series 6) is likely post infectious or inflammatory.

Within the mediastinum there is enlarged hypermetabolic RIGHT lower
paratracheal lymph node measuring 18 mm short axis with SUV max
equal 13.4. There is hypermetabolic RIGHT hilar lymph node which is
difficult to define on noncontrast CT.

No contralateral hypermetabolic lymph nodes. No supraclavicular
lymph nodes.

ABDOMEN/PELVIS

No abnormal hypermetabolic activity within the liver, pancreas,
adrenal glands, or spleen. No hypermetabolic lymph nodes in the
abdomen or pelvis. Aortic stent graft noted. Postcholecystectomy.
Sigmoid diverticulosis

SKELETON

No focal hypermetabolic activity to suggest skeletal metastasis.
IMPRESSION: 1. Hypermetabolic RIGHT upper lobe mass abutting the pleural surface
consists with bronchogenic carcinoma.
2. Hypermetabolic RIGHT hilar and RIGHT lower paratracheal
metastatic adenopathy.
3. No contralateral or supraclavicular hypermetabolic or enlarged
lymph nodes.

## 2017-01-15 ENCOUNTER — Other Ambulatory Visit: Payer: Self-pay | Admitting: Family Medicine

## 2017-01-31 ENCOUNTER — Telehealth: Payer: Self-pay | Admitting: Pulmonary Disease

## 2017-01-31 NOTE — Telephone Encounter (Signed)
Pt requesting an appt to see SN only.  Pt c/o prod cough with yellow mucus, intermittent low grade temp of 99.  Pt states he is feeling well, taking mucinex and tylenol to help with symptoms, but is concerned about his mucus turning from white to yellow X3 days.  Pt requesting appt to see SN only.  I offered pt an appt to see JN tomorrow but pt declined, preferring to wait and see SN on Monday.  I advised pt to call back if symptoms worsen.  Pt expressed understanding.  Nothing further needed.

## 2017-02-01 ENCOUNTER — Encounter: Payer: Self-pay | Admitting: *Deleted

## 2017-02-01 ENCOUNTER — Telehealth: Payer: Self-pay | Admitting: Medical Oncology

## 2017-02-01 ENCOUNTER — Ambulatory Visit: Payer: Medicare Other | Admitting: Vascular Surgery

## 2017-02-01 ENCOUNTER — Encounter (HOSPITAL_COMMUNITY): Payer: Medicare Other

## 2017-02-01 ENCOUNTER — Other Ambulatory Visit (HOSPITAL_COMMUNITY): Payer: Medicare Other

## 2017-02-01 NOTE — Telephone Encounter (Signed)
Returned pt call and told him scan has to be [place] first before it can be scheduled.

## 2017-02-04 ENCOUNTER — Other Ambulatory Visit: Payer: Medicare Other

## 2017-02-04 ENCOUNTER — Encounter: Payer: Self-pay | Admitting: Pulmonary Disease

## 2017-02-04 ENCOUNTER — Ambulatory Visit (INDEPENDENT_AMBULATORY_CARE_PROVIDER_SITE_OTHER)
Admission: RE | Admit: 2017-02-04 | Discharge: 2017-02-04 | Disposition: A | Discharge status: Home / Self Care | Payer: Medicare Other | Source: Ambulatory Visit | Attending: Pulmonary Disease | Admitting: Pulmonary Disease

## 2017-02-04 ENCOUNTER — Ambulatory Visit (INDEPENDENT_AMBULATORY_CARE_PROVIDER_SITE_OTHER): Payer: Medicare Other | Admitting: Pulmonary Disease

## 2017-02-04 VITALS — BP 118/72 | HR 78 | Temp 97.0°F | Ht 70.0 in | Wt 179.1 lb

## 2017-02-04 DIAGNOSIS — I679 Cerebrovascular disease, unspecified: Secondary | ICD-10-CM

## 2017-02-04 DIAGNOSIS — J90 Pleural effusion, not elsewhere classified: Secondary | ICD-10-CM | POA: Diagnosis not present

## 2017-02-04 DIAGNOSIS — J209 Acute bronchitis, unspecified: Secondary | ICD-10-CM | POA: Diagnosis not present

## 2017-02-04 DIAGNOSIS — J440 Chronic obstructive pulmonary disease with acute lower respiratory infection: Principal | ICD-10-CM

## 2017-02-04 DIAGNOSIS — I2510 Atherosclerotic heart disease of native coronary artery without angina pectoris: Secondary | ICD-10-CM | POA: Diagnosis not present

## 2017-02-04 DIAGNOSIS — N183 Chronic kidney disease, stage 3 unspecified: Secondary | ICD-10-CM

## 2017-02-04 DIAGNOSIS — I5042 Chronic combined systolic (congestive) and diastolic (congestive) heart failure: Secondary | ICD-10-CM

## 2017-02-04 DIAGNOSIS — J4489 Other specified chronic obstructive pulmonary disease: Secondary | ICD-10-CM

## 2017-02-04 DIAGNOSIS — J449 Chronic obstructive pulmonary disease, unspecified: Secondary | ICD-10-CM | POA: Diagnosis not present

## 2017-02-04 DIAGNOSIS — Z95828 Presence of other vascular implants and grafts: Secondary | ICD-10-CM

## 2017-02-04 DIAGNOSIS — C3411 Malignant neoplasm of upper lobe, right bronchus or lung: Secondary | ICD-10-CM | POA: Diagnosis not present

## 2017-02-04 DIAGNOSIS — Z951 Presence of aortocoronary bypass graft: Secondary | ICD-10-CM

## 2017-02-04 DIAGNOSIS — R05 Cough: Secondary | ICD-10-CM | POA: Diagnosis not present

## 2017-02-04 DIAGNOSIS — I714 Abdominal aortic aneurysm, without rupture, unspecified: Secondary | ICD-10-CM

## 2017-02-04 DIAGNOSIS — I255 Ischemic cardiomyopathy: Secondary | ICD-10-CM

## 2017-02-04 MED ORDER — LEVOFLOXACIN 750 MG PO TABS: 750.0000 mg | ORAL_TABLET | Freq: Every day | ORAL | 0 refills | Status: DC

## 2017-02-04 MED ORDER — PREDNISONE 20 MG PO TABS: ORAL_TABLET | ORAL | 0 refills | Status: DC

## 2017-02-04 MED ORDER — LEVALBUTEROL HCL 0.63 MG/3ML IN NEBU
0.6300 mg | INHALATION_SOLUTION | Freq: Once | RESPIRATORY_TRACT | Status: AC
  Administered 2017-02-04: 0.63 mg via RESPIRATORY_TRACT

## 2017-02-04 NOTE — Patient Instructions (Signed)
Today we updated your med list in our EPIC system...    Continue your current medications the same...  Today we checked your sputum for culture AND a f/u CXR...    We will contact you w/ the results when available...   We have prescribed LEVAQUIN 750mg  one tab daily til gone...    Be sure to take a good probiotic like ALIGN daily while on the antibiotic...  We also wrote for a tapering course of PREDNISONE 20mg  tabs for the airway inflammation...    Start w/ one tab twice daily for 5 days...    Then decrease to one tab daily in the AM for 5 days...    Then decrease to 1/2 tab daily in the AM for 5 days...    Then decrease to 1/2 tab every other day til gone (1/2, 0, 1/2, 0, etc)  Continue the GUAIFENESIN 400mg  tabs- 2 tabs three times daily...  Call for any questions or if we can be of servicein any way.Marland KitchenMarland Kitchen

## 2017-02-04 NOTE — Progress Notes (Signed)
Subjective:     Patient ID: SALVADORE Sandoval, male   DOB: 1941/01/04, 76 y.o.   MRN: [id number]  HPI  ~  February 29, 2016:  Crystal w/ SN>  [place] is a 76 y/o WF w/ mult medical problems as noted below who has seen DrAlva & DrMannam previously;  I have reviewed his extensive epic records and recent [place] x2 in June2017 and formulated the following PROBLEM LIST>>  His PCP is DrLuking in [place]... He reminded me that I cared for his dad in 64...    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    ~  [place]  Fever, cough, increased RUL opac (PCT<0.10, he received 10d Levaquin) & right>left effusion w/ thoracentesis 02/22/16 removing 1200cc clear yellow fluid, prob transudate, & BNP was >900; meds adjusted & he was diuresed w/ improvement but 2DEcho revealed worsening CHF w/ EF ~30-35% + HK & AK => he has f/u pending w/ Cards...    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    ASPVD- s/p AAA stent graft 11/2012, mod bilat carotid occlusive dis (asymptomatic) w/ CDopplers showing ~50-60% bilat ICAstenoses>  [facility] plans yearly f/u CT Abd and CDopplers    MEDICAL issues>  HBP, HL, GERD/ Divertic/ Polyps, CKD-stage3, Anemia & thrombocytopenia>  On Simva80-1/2Qd, Prilosec20/d, Fe/ MVI/ etc... Since he was disch 02/25/16>  He reports feeling better, breathing better, still sl SOB w/ activ but not requiring O2, mild cough w/ beige sput, no hemoptysis, denies CP=> he is due for f/u CXR & blood work post hosp... EXAM shows Afeb, VSS, O2sat=95% on RA after walking back; Wt=180#;  HEENT- neg, mallampati2;  Chest- sl decr BS right base, few rhonchi, w/o w/r/consolidation;  Heart- RR Gr1/6SEM w/o r/g;  Abd- soft, neg;  Ext- neg w/o c/c/e...  PFT 02/01/16>  FVC=1.96 (45%), FEV1=1.25 (40%), %1sec=64, mid-flows were reduced at 28% predicted; post bronchodil there was a 31% improvement in FEV1 to 1.64;  TLC=5.96 (83%, RV=3.89 (  149%), RV/TLC=65%;  DLCO=58% pred.  This is c/w moderate to severe airflow obstruction, GOLD Stage 3 COPD w/ a signif asthmatic component, air trapping, and decr diffusion capacity...  2DEcho 02/22/16>  Mild conc LVH w/ decr LVF & EF=30-35% w/ diffuse HK & AK in several walls (see report), Gr2 DD, mild MR otherw norm valves, mild LAdil (25m), norm RV function & PA pressures  LABS 01/2016 Hosp>  Chems- ok x Cr=1.3-1.4, BS=120-140, Alb=2.8;  CBC- anemic w/ Hg=8-9 range, Plat~130K range;  TSH=0.92  CT Chest 02/21/16>  Norm heart size, extensive coronary calcif, s/p CABG, decr size of the pathological  right paratracheal LN, decr size of RUL mass (now ~2cm & prev ~4cm), new confluent airsp opac w/ air bronchograms in RUL w/ large right effusion; s/p GB, +HH, Abd Ao stent graft, DJD & DISH w/o metastatic lesions  CT Angio Chest 02/24/16>  NEG for PE, s/pCABG & stent in Lmain, norm heart size, no pericard fluid, small right pleural effusion, architectural distortion & interstitial opacities in RUL c/w XRT changes, RUL nodule measures ~2cm, no adenopathy reported...   CXR 02/29/16>  Stable heart size, tortuous Ao, s/p CABG, sl improvement in interstitial opac in RUL area & posteromedial RUL nodule  LABS 02/29/16>  Chems- ok x HCO3=36, BS=120, BUN/Cr=30/1.40;  CBC- Hg=10.9, Plat=249K, WBC=18.2 (on Pred);  BNP=1053;  Sed=53 IMP/PLAN>>  Problem list as above w/ signif cardiac, pulmonary, & post chemoradiation changes;  From the pulm standpoint- he feels the BREO is helping & hasn't needed the rescue inhaler very often, continue same;  He is on a long PRED taper per DrManning for poss radiation fibrosis RUL, Sed rate is 53, continue this taper & careful w/ sweets/ sugar etc;  From the cardiac standpoint- he has signif underlying dis w/ Lmain stent, s/p CABG 20 yrs ago, prob ischemic cardiomyopathy w/ worsening 2DEcho recently w/ 30-35% EF + HK&AK seen, BNP=1053, Cards plans ischemic work up when able, in the meanwhile he has improved w/ diuresis but Chems w/ mild RI & HCO3=36... REC- add DIAMOX-ER 500 one tab daily at 4pm, continue his Lasix40 Qam, increase water intake, watch weights... We plan ROV [id number]...  ~  April 04, 2016:  50m74moV w/ SN>> BilDontrelllled the Oncology symptom management clinic 8/4 w/ intermittent cough, wheezing, fever; they did a f/u CXR interpreted as showing a right base opac suspicious for acute pneumonia but this is a soft finding on my review of the XRay, and the prev RUL reticulonod opac is improved (radiation fibrosis improved w/ Pred taper); placed on Levaquin Rx;  He denies sput  production, hemoptysis, CP, or SOB but he is fairly sedentary.    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    ~  [place]>  Fever, cough, increased RUL opac (PCT<0.10, he received 10d Levaquin) & right>left effusion w/ thoracentesis 02/22/16 removing 1200cc clear yellow fluid, prob transudate, & BNP was >900; meds adjusted & he was diuresed w/ improvement but 2DEcho revealed worsening CHF w/ EF ~30-35% + HK & AK => he has f/u pending w/ Cards...    ~  [facility]  He had right pleural effusion tapped 02/22/16=> Prob TRANSUDATE w/ TProt<3.0, LDH=95, Cytology=NEG (reactive mesothelial cells only);  Cults=> no growth    ~  03/2016 developed cough/ wheezing/ low grade fever=> CXR w/ improved RUL, ?incr markings R base, he was off the Pred, given more Levaquin...     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    ~  He saw CARDS PA 03/01/16> no change in meds, Lexiscan Myoview 03/06/16 showed hi risk study w/ EF=30%, no ST segm changes, c/w antero-apical MI & no evid reversible ischemia...    ASPVD- s/p AAA stent graft 11/2012, mod bilat carotid occlusive dis (asymptomatic) w/ CDopplers showing ~50-60% bilat ICAstenoses>  [facility] plans yearly f/u CT Abd and CDopplers    MEDICAL issues>  HBP, HL, GERD/ Divertic/ Polyps, CKD-stage3, Anemia & thrombocytopenia>  On Simva80-1/2Qd, Prilosec20/d, Fe/ MVI/ etc... EXAM shows Afeb, VSS, O2sat=99% on RA; Wt=178#;  HEENT- neg, mallampati2;  Chest- sl decr BS right base, few rhonchi, w/o w/r/consolidation;  Heart- RR Gr1/6SEM w/o r/g;  Abd- soft, neg;  Ext- neg w/o c/c/e...  Lexiscan Myoview 03/06/16 showed hi risk study w/ EF=30%, no ST segm changes, c/w antero-apical MI & no evid reversible ischemia...  CXR 03/30/16>  incr patchy opac at the right base, no effusion, RUL reticulonodular opac in RUL has regressed; norm heart size s/p CABG, abd Ao endograft part vis  LABS 03/28/16>  Chems- ok w/ K=4.0, HCO3=23, Cr=1.6, BS=145;  CBC- ok w/ Hg=11.4, wbc=6.1, plat=85K;   IMP/PLAN>>  Discussed w/ Hurshel-- continue the Levaquin til gone, use Tylenol for fever or pain; continue the Breo daily & Ventolin-HFA as needed; wean off the Diamox at this time, continue Lasix40, no salt, etc; he needs to gradually increase his exercise/ mobility; we will plan ROV w/ labs in 230mo.  ~  June 06, 2016:  53m153moV w/ SN>  BilDaaielports that he is feeling better, gaining strength, walking 53mi49m mowing yard, and resting well at night;  He notes min hacking cough, no sput- no discoloration or blood, SOB is diminished & he denies CP, no f/c/s... He saw DrMohamed 05/16/16- pt remains on observation (s/p chemoradiation, 5 cycles w/ partial  response); repeat CT Chest 05/10/16 showed more confluent airsp dis in RUL w/ vol loss, abn soft tissue attenuation in right hilum has progressed (?felt to be treatment related), stable subcarinal LN ~11mm19mderate right effusion... DrMohamed felt this scan showed no concerning features of dis progression & they opted for continued observ  w/ f/u 50mo.. 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    ~  [place]>  Fever, cough, increased RUL opac (PCT<0.10, he received 10d Levaquin) & right>left effusion w/ thoracentesis 02/22/16 removing 1200cc clear yellow fluid, prob transudate, & BNP was >900; meds adjusted & he was diuresed w/ improvement but 2DEcho revealed worsening CHF w/ EF ~30-35% + HK & AK => he has f/u pending w/ Cards...    ~  [facility]  He had right pleural effusion tapped 02/22/16=> Prob TRANSUDATE w/ TProt<3.0, LDH=95, Cytology=NEG (reactive mesothelial cells only);  Cults=> no growth    ~  03/2016 developed cough/ wheezing/ low grade fever=> CXR w/ improved RUL, ?incr markings R base,  he was off the Pred, given more Levaquin...     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    ~  He saw CARDS PA 03/01/16> no change in meds, Lexiscan Myoview 03/06/16 showed hi risk study w/ EF=30%, no ST segm changes, c/w antero-apical MI & no evid reversible ischemia...    ASPVD- s/p AAA stent graft 11/2012, mod bilat carotid occlusive dis (asymptomatic) w/ CDopplers showing ~50-60% bilat ICAstenoses>  [facility]- plans yearly f/u CT Abd and CDopplers    MEDICAL issues>  HBP, HL, GERD/ Divertic/ Polyps, CKD-stage3, Anemia & thrombocytopenia>  On Simva80-1/2Qd, Prilosec20/d, Fe/ MVI etc... EXAM shows Afeb, VSS, O2sat=95% on RA; Wt=183# (up 5#);  HEENT- neg, mallampati2;  Chest- sl decr BS right base, few rhonchi, w/o w/r/consolidation;  Heart- RR Gr1/6SEM w/o r/g;  Abd- soft, neg;  Ext- neg w/o c/c/e...  CT Chest 05/10/16 showed more confluent airsp dis in RUL w/ vol loss, abn soft tissue attenuation in right hilum has progressed (?felt to be treatment related), stable subcarinal LN ~1161mmoderate right effusion.  CXR 06/06/16> progressive incr density in RUL & vol loss, soft tissue fullness in right hilum, s/p CABG and calcif in wall of Ao arch  PET scan 06/15/16> persistent but diminished hypermetabolism in the RUL area of interstitial & airsp opac-  likely related to XRT, no hypermetabolism in the prev R paratrachial LN, similar sized subcarinal LN w/ low level hypermetabolism noted; new area of LUL anterior  interstitial & airsp dis w/ some bronchiectasis shows low level hypermetabolism (?radiation change?); moderate right effusion...  IMP/PLAN>>  He is feeling better, performance status improving, CXR shows progressive changes in RUL s/p chemoradiation; CT=>PET scans w/ vol loss & persistent hypermetabolism/ evolving changes in RUL & right hilum plus he has a mod large right effusion; prev thoracentesis 01/2016 in hosp was transudative- I believe he would benefit from further eval/ repeat thoracentesis to recheck this fluid; we will set this up via IR & ask them to drain the fluid as much as poss, send it for Cytology, cell ct & diff, TProt/ LDH/ Gluc...  ~  August 06, 2016:  27moROV w/ SN>  BBrasonreports that his SOB & energy have improved, appetite is better; he is more active walking 292m5d/wk + yard work/ mowing/ leaves/ etc; he notes min cough, small amt beige sput, stable DOE, no CP or edema... He tells me that DrMohamed has CT Chest & LABS ordered for next week so we will wait for these tests & review when avail...     COPD (Stage3 w/ signif revers component), former smoker (quit 1996 w/ ~40+ pack-yr hx)>  On Symbicort160-2spBid regularly ([facility] changed Rx), VentolinHFA rescue inhaler as needed...    He saw DrCherly Hensenor CARDS 07/04/16>  HBP, CAD- s/p CABG 1996 & subseq PCI to graft vessels, chr combined sys&diast CHF w/ cardiomyopathy, PVD- w/ carotid dis & Ao stent graft per drLawson, HL, CKD;  No changes made to his ASA/ Plavix, Metoprolol50-1.5Bid, Lasix40, Simva40...     EXAM shows Afeb, VSS, O2sat=98% on RA; Wt=187# (up 4#);  HEENT- neg, mallampati2;  Chest- sl decr BS right base, few rhonchi, w/o w/r/consolidation;  Heart- RR Gr1/6SEM w/o r/g;  Abd- soft, neg;  Ext- neg w/o c/c/e...  Throacentesis 06/22/17 by IR>  1.2L removed (hazy yellow fluid)=> Cell ct=923 cells w/ 2 Neutro/ 58 lymph/ 40 mono-macrophages;  Chems- LDH=119, TPro=4.1, Gluc=101;  Cyto= Atypic cells c/w reactive mesothel cells.  Post  thoracentesis CXR 06/22/17>  decr right effusion, no pneumoth, dense consolid RUL unchanged, post-op BABG...  CT Chest 08/14/16>  Norm heart size- s/p CABG & Ao atherosclerotic changes; stable upper lim adenopathy; decr right effusion- now small in size; similar interstitial & airsp dis in right perihilar & medial LUL is c/w radiation fibrosis; no signif pulm nodules or masses- no new or progressive dis...   LABS 08/14/17>  Chems- ok x Cr=1.6;  CBC- ok w/ Hg=12.7...Marland KitchenMarland KitchenMP/PLAN>>  BiCalums clinically stable, no clear evid of recurrent dis & followed very closely by DrMohamed; Pulm stable on Symbicort Bid, Albut rescue prn, and his exercise- continue same & we plan rov recheck in 48m51mo  ~  November 05, 2016:  48mo80mo & Malik indicates "stronger, better" on diet & exercise program w/ good energy (eg- climbed steps at a ball game);  He denies much cough (clear throat), small amt clear sput, no blood, SOB w/ exertion only- ADLs ok & DOE stable, no CP & he describes 2mi 56mk 5d/wk & he stays busy...  [facility] subcarinal LN, scarring & radiation fibrosis, ?sl incr right effusion)... 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    ~  [place]  Fever, cough, increased RUL opac (PCT<0.10, he received 10d Levaquin) & right>left effusion w/ thoracentesis 02/22/16 removing 1200cc clear yellow fluid, prob transudate, & BNP was >900; meds adjusted & he was diuresed w/ improvement but 2DEcho revealed worsening CHF w/ EF ~30-35% + HK & AK => he has f/u pending w/ Cards...    ~  [facility]  He had right pleural effusion tapped 02/22/16=> Prob TRANSUDATE w/ TProt<3.0, LDH=95, Cytology=NEG (reactive mesothelial cells only);  Cults=> no growth    ~  03/2016 developed cough/ wheezing/ low grade fever=> CXR w/ improved RUL, ?incr markings R base, he was off the Pred, given more Levaquin...     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    ~  He saw CARDS PA 03/01/16> no change in meds, Lexiscan Myoview 03/06/16 showed hi risk study w/ EF=30%, no ST segm changes, c/w antero-apical MI & no evid reversible ischemia...    ASPVD- s/p AAA stent graft 11/2012, mod bilat carotid occlusive dis (asymptomatic) w/ CDopplers showing ~50-60% bilat ICAstenoses>  [facility]- plans yearly f/u CT Abd and CDopplers    MEDICAL issues>  HBP, HL, GERD/ Divertic/ Polyps, CKD-stage3 (cr=1.6), Anemia (Hg=11.9) & thrombocytopenia (plat=171K)>  On Simva80-1/2Qd, Prilosec20/d, Fe/ MVI etc... EXAM shows Afeb, VSS, O2sat=98% on RA; Wt=189# (up 2#);  HEENT- neg,  mallampati2;  Chest- sl decr BS right base, few rhonchi, w/o w/r/consolidation;  Heart- RR Gr1/6SEM w/o r/g;  Abd- soft, neg;  Ext- neg w/o c/c/e...  CT Chest 10/30/16>  Norm heart size, s/p CABG, atherosclerosis in Ao; prev 154msubcarinal LN now measures 1376msl incr in right pleural effusion, radiation fibrosis & changes in medial right lung and ant LULare stable/ unchanged;   LABS 10/29/16>  Chems- wnl w/ Cr=1.6, LFTs= wnl;  CBC- ok w/ Hg=11.9, WBC=10.1  Ambulatory Oximetry 11/05/16>  O2sat=96% on RA at rest w/ pulse=81/min;  He ambulated 3 laps in office (185'each) w/ lowest O2sat=90% w/ pulse 99/min... IMP/PLAN>>  BilLarance doing satis on Symbicort160-2spBid & Albut rescue inhaler prn; he states that he is active, energy is good, & that he's getting stronger &  better (good performance status); I suspect that he has residual cancer in the right chest but it appears slowly progressive (w/ sl incr right effusion & subcarinal LN) and DrMohamed to review status & decide regarding additional therapy at this point vs continued observation... we plan rov recheck in 3-71mo.  NOTE:  >50% of this 25 min rov was spent in counseling & coordination of care...  ~  Jan 02, 2017:  33mo ROV & [place] called 4/24 c/o cough, yellow sput, low grade fever after having been Rx x2 by PCP w/ Doxy (note- Flu-B was pos in resp viral panel); we placed him on Levaquin & Medrol dosepak & he is some better- still congested and phlegm is beige but feeling better overall; we discussed addition of Mucinex 1200bid + fluids vs Guaifenesin 400-2Tid to save $$...     COPD> on Symbicort160-2spBid and Ventolin-HFA prn    Lung cancer- followed by DrMohamed & DrManning> RUL non-small cell ca IIIA, dx 3/17 & rx w/ Chemoradiation; subseq RUL opac (infection vs XRT-fibrosis & right effusion;  He saw DrMohamed 11/15/16- note reviewed & he was doing well at that time, playing golf & feeling good; CT Chest 10/30/16 w/ sl incr right effusion, sl incr  subcarinal LN from 10=>67mm, radiation fibrosis areas stable; DrMohamed felt there was no clear sign of dis progression & rec f/u scans in 69mo (due 01/2017)...    Cardiac- CAD, s/p CABG, Cardiomyopathy, CHF> on ASA/ Plavix, Metop25Bid, Lasix40; he saw Cherly Hensen 12/21/16- note reviewed; EF=30%, he mentioned more aggressive cardiac w/u & rx if cancer progrosis favorable (no changes made, f/u 84mo w/ 2DEcho)...    ASPVD- s/p AAA stent graft, bilat carotid dis> on ASA/ Plavix    Medical issues>  HBP, HL, GERD/ Divertic/ Polyps, CKD-stage3 (cr=1.6), Anemia (Hg=11.9) & thrombocytopenia (plat=171K)> EXAM shows Afeb, VSS, O2sat=98% on RA; Wt=183# (down 6#);  HEENT- neg, mallampati2;  Chest- sl decr BS right base, few rhonchi, w/o w/r/consolidation;  Heart- RR Gr1/6SEM w/o r/g;  Abd- soft, neg;  Ext- neg w/o c/c/e... IMP/PLAN>>  Viggo is improved but it's been a slow process; he is at high risk for cancer progression & followed closely by DrMohamed who plans f/u CT & recheck in June;  Continue current meds, add Guaifenesin 2400mg /d + water to aid in mucus production... We will recheck in 74mo & review his f/u scans planned by Oncology.   ~  February 04, 2017:  22mo ROV & add-on appt requested for cough, thick yellow sput, chest congestion w/ wheezing>  Notes low grade temp in the afternoon w/o c/s;  Denies CP/ palpit/ hemoptysis/ change in SOB/DOE; He is taking SYMBICORT160-2spBid, GFN 400-2Tid + fluids, plus rescue inhaler => we gave him a NEB Rx in office (min relief), sent sput for C&S (grew abundant NTF only); we discussed need for f/u CXR (CT due soon from [place]), and Rx w/ Levaquin/ Pred taper...    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    ~  [place] x2 in 01/2016>  Fever, cough, increased RUL opac (PCT<0.10, he received 10d Levaquin) & right>left effusion w/ thoracentesis 02/22/16 removing 1200cc clear yellow fluid, prob transudate, & BNP was >900; meds adjusted & he was diuresed w/ improvement but 2DEcho revealed worsening CHF w/ EF ~30-35% + HK & AK => he has f/u pending w/ Cards...    ~  Eval in the [facility] 01/2016>  He had right pleural effusion tapped 02/22/16=> Prob TRANSUDATE w/ TProt<3.0, LDH=95, Cytology=NEG (reactive mesothelial cells only);  Cults=> no growth    ~  03/2016 developed cough/ wheezing/ low grade fever=> CXR w/ improved RUL, ?incr markings R base, he was off the Pred, given more Levaquin...     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 in [facility] 01/2016- pt was diuresed & they plan an outpt ischemic eval due to decr EF & wall motion abn..    ~  He saw CARDS PA 03/01/16> no change in meds, Lexiscan Myoview 03/06/16 showed hi risk study w/ EF=30%, no ST segm changes, c/w antero-apical MI & no evid reversible ischemia...    ASPVD- s/p AAA stent  graft 11/2012, mod bilat carotid occlusive dis (asymptomatic) w/ CDopplers showing ~50-60% bilat ICAstenoses>  Stable & seen by [facility] 01/2016- plans yearly f/u CT Abd and CDopplers    MEDICAL issues>  HBP, HL, GERD/ Divertic/ Polyps, CKD-stage3 (cr=1.6), Anemia (Hg=11.9) & thrombocytopenia (plat=171K)>  On Simva80-1/2Qd, Prilosec20/d, Fe/ MVI etc... EXAM shows Afeb, VSS, O2sat=97% on RA; Wt=179# (down 4#);  HEENT- neg, mallampati2;  Chest- sl decr BS right base, few rhonchi, w/o w/r/consolidation;  Heart- RR Gr1/6SEM w/o r/g;  Abd- soft, neg;  Ext- neg w/o c/c/e...  CXR 02/04/17 (independently reviewed by me in the PACS system)> ?loculated mod right pleural effusion, RUL airsp dis is unchanged & c/w post XRT changes; left lung is clear, stable cardiac silhouette & prior CABG  Sputum C&S> abundant NTF, no pathogen identified... IMP/PLAN>>  We discussed the need for another thoracentesis=> check cytology;  We will Rx w/ empiric LEVAQUIN & PREDNISONE taper, plus his GFN at max doses w/ fluids;  Also awaiting f/u CT Chest due soon...  ADDENDUM>> Thoracentesis by IR 02/11/17>  280cc of dark bloody fluid removed, due to loculation no additional fluid was obtainable;  Fluid= exudative. & Cytology= NEGATIVE... ADDENDUM>> f/u CT Chest done 02/13/17>  Norm heart size, aortic atherosclerosis, prev CABG; mod loculated right pleural effusion; mass-like architectural distortion in RUL c/w XRT, new area of airsp consolidation in RML~3cm, mult faint nodular densities scattered on the right lung, borderline subcarinal node; Abd & musculoskeletal are ok... ADDENDUM>>  02/15/17>  Pt indicates that he is ?no better, c/o persistent cough, congested, thick yellow sput, no hemoptysis, low grade temps 99-100 range, denies much CP (just sore from coughing) and SOB/DOE about the same;  We discussed his limited options here & have suggested the following--    1) keep PRED 10mg /d for now;  Add empiric AUGMENTIN 875mg  one tab bid x10d  (plus Align/Activia);  Add NEBULIZER w/ ALBUTEROL TID regularly and space out the Symbicort Rx still at 2sp Bid.Marland KitchenMarland Kitchen    2) check 2DEcho to compare EF to 7/17 (30-35%)    3) refer to TCTS for the chr right pleural effusion to consider poss chest tube vs VATS vs decortication surg (DrBartle did his CABG yrs ago)    Past Medical History:  Diagnosis Date  . AAA (abdominal aortic aneurysm) ([place]) 2010   4.4 cm 08/2008;4.44 in 7/10 and 4.65 in 08/2009; 4.8 by CT in 11/2009; 4.3 by ultrasound in 08/2010  . Arteriosclerotic cardiovascular disease (ASCVD) 1996   CABG-1996  . Arthritis    "fingers" (03/18/2014)  . CAD (coronary artery disease)    03/18/14:  PCI with DES to distal left main. 7/29: DES to the SVG to Diag  . Cancer ([place])    Upper right lobe lung cancer  . Cardiomyopathy, ischemic    Echo 03/17/14: EF 45-50%  . Chronic bronchitis ([place])   . Chronic rhinitis   . Colonic polyp 2002   polypectomy in 2002  . COPD (chronic obstructive pulmonary disease) ([place])   . Diverticulosis   . ED (erectile dysfunction)   . Encounter for antineoplastic chemotherapy 12/19/2015  . GERD (gastroesophageal reflux disease)   . Hyperlipidemia   . Hypertension   . IFG (impaired fasting glucose)   . Myocardial infarction [facility])    "told h/o silent MI sometime before 1996"  . Pneumonia ~ 2001; ~ 2005  . Right bundle branch block   . Tobacco abuse, in remission    40 pack year total consumption; discontinued in 1996    Past Surgical History:  Procedure Laterality Date  . ABDOMINAL AORTIC ANEURYSM REPAIR  11/2012  . ABDOMINAL AORTIC ENDOVASCULAR STENT GRAFT N/A 12/11/2012   Procedure: ABDOMINAL AORTIC ENDOVASCULAR STENT GRAFT;  Surgeon: Mal Misty, MD;  Location: [place];  Service: Vascular;  Laterality: N/A;  Ultrasound guided; [place]  . CARDIAC CATHETERIZATION  01/08/1995  . COLONOSCOPY  2002   polypectomy-patient denies  . CORONARY ANGIOPLASTY WITH STENT PLACEMENT  03/18/2014   "1"  . CORONARY ANGIOPLASTY  WITH STENT PLACEMENT  03/24/2014   "1"  . CORONARY ARTERY BYPASS GRAFT  01/09/1995   "CABG X3"  . JOINT REPLACEMENT    . LAPAROSCOPIC CHOLECYSTECTOMY  12/2009  . LEFT AND RIGHT HEART CATHETERIZATION WITH CORONARY/GRAFT ANGIOGRAM N/A 03/18/2014   Procedure: LEFT AND RIGHT HEART CATHETERIZATION WITH Beatrix Fetters;  Surgeon: Blane Ohara, MD;  Location: [facility] CATH LAB;  Service: Cardiovascular;  Laterality: N/A;  . PERCUTANEOUS CORONARY STENT INTERVENTION (PCI-S)  03/18/2014   Procedure: PERCUTANEOUS CORONARY STENT INTERVENTION (PCI-S);  Surgeon: Blane Ohara, MD;  Location: [facility] CATH LAB;  Service: Cardiovascular;;  . PERCUTANEOUS CORONARY STENT INTERVENTION (PCI-S) N/A 03/24/2014   Procedure: PERCUTANEOUS CORONARY STENT INTERVENTION (PCI-S);  Surgeon: Blane Ohara, MD;  Location: [facility] CATH LAB;  Service: Cardiovascular;  Laterality: N/A;  . TOTAL HIP ARTHROPLASTY Left 01/21/2013   Procedure: TOTAL HIP ARTHROPLASTY ANTERIOR APPROACH;  Surgeon: Mauri Pole, MD;  Location: [place];  Service: Orthopedics;  Laterality: Left;  Marland Kitchen VIDEO BRONCHOSCOPY N/A 11/17/2015   Procedure: VIDEO BRONCHOSCOPY WITH FLUORO;  Surgeon: Rigoberto Noel, MD;  Location: [place];  Service: Cardiopulmonary;  Laterality: N/A;    Outpatient Encounter Prescriptions as  of 02/04/2017  Medication Sig  . acetaminophen (TYLENOL) 500 MG tablet Take 500 mg by mouth every 6 (six) hours as needed for mild pain.  Marland Kitchen aspirin EC 81 MG tablet Take 81 mg by mouth daily.  . budesonide-formoterol (SYMBICORT) 160-4.5 MCG/ACT inhaler Inhale 2 puffs into the lungs 2 (two) times daily.  . clopidogrel (PLAVIX) 75 MG tablet Take 1 tablet (75 mg total) by mouth daily. Reported on 12/02/2015  . Ferrous Sulfate (IRON) 28 MG TABS Take 28 mg by mouth daily.  . furosemide (LASIX) 40 MG tablet Take 1 tablet (40 mg total) by mouth daily.  . Metoprolol Tartrate 75 MG TABS Take 1 tablet by mouth 2 (two) times daily.  . Multiple Vitamins-Minerals  (CENTRUM SILVER ADULT 50+) TABS Take 1 tablet by mouth daily.   . nitroGLYCERIN (NITROSTAT) 0.4 MG SL tablet Place 1 tablet (0.4 mg total) under the tongue every 5 (five) minutes as needed for chest pain.  Marland Kitchen omeprazole (PRILOSEC) 20 MG capsule TAKE ONE CAPSULE BY MOUTH DAILY.  . simvastatin (ZOCOR) 80 MG tablet Take 0.5 tablets (40 mg total) by mouth at bedtime.  . VENTOLIN HFA 108 (90 Base) MCG/ACT inhaler INHALE 2 PUFFS BY MOUTH EVERY 4 TO 6 HOURS AS NEEDED FOR WHEEZING.  Marland Kitchen levofloxacin (LEVAQUIN) 750 MG tablet Take 1 tablet (750 mg total) by mouth daily.  . predniSONE (DELTASONE) 20 MG tablet Take as directed  . [DISCONTINUED] ondansetron (ZOFRAN ODT) 4 MG disintegrating tablet Take 1 tablet (4 mg total) by mouth every 8 (eight) hours as needed for nausea or vomiting. (Patient not taking: Reported on 02/04/2017)  . [EXPIRED] levalbuterol (XOPENEX) nebulizer solution 0.63 mg    No facility-administered encounter medications on file as of 02/04/2017.     Allergies  Allergen Reactions  . Neomycin Hives    Immunization History  Administered Date(s) Administered  . H1N1 08/06/2008  . Influenza Split 06/24/2013  . Influenza,inj,Quad PF,36+ Mos 06/15/2014, 05/31/2015, 05/09/2016  . Influenza-Unspecified 05/27/2012  . Pneumococcal Conjugate-13 08/18/2014  . Pneumococcal Polysaccharide-23 03/27/2012  . Zoster 07/27/2008    Current Medications, Allergies, Past Medical History, Past Surgical History, Family History, and Social History were reviewed in [employer] record.   Review of Systems             All symptoms NEG except where BOLDED >>  Constitutional:  F/C/S, fatigue, anorexia, unexpected weight change. HEENT:  HA, visual changes, hearing loss, earache, nasal symptoms, sore throat, mouth sores, hoarseness. Resp:  cough, sputum, hemoptysis; SOB, tightness, wheezing. Cardio:  CP, palpit, DOE, orthopnea, edema. GI:  N/V/D/C, blood in stool; reflux, abd pain,  distention, gas. GU:  dysuria, freq, urgency, hematuria, flank pain, voiding difficulty. MS:  joint pain, swelling, tenderness, decr ROM; neck pain, back pain, etc. Neuro:  HA, tremors, seizures, dizziness, syncope, weakness, numbness, gait abn. Skin:  suspicious lesions or skin rash. Heme:  adenopathy, bruising, bleeding. Psyche:  confusion, agitation, sleep disturbance, hallucinations, anxiety, depression suicidal.   Objective:   Physical Exam       Vital Signs:  Reviewed...   General:  WD, WN, 76 y/o WM in NAD; alert & oriented; pleasant & cooperative... HEENT:  [place]/AT; Conjunctiva- pink, Sclera- nonicteric, EOM-wnl, PERRLA, EACs-clear, TMs-wnl; NOSE-clear; THROAT-clear & wnl.  Neck:  Supple w/ fair ROM; no JVD; normal carotid impulses w/ faint bruits; no thyromegaly or nodules palpated; no lymphadenopathy.  Chest:  decr BS right base & clear w/o w/r/r & no signs of consolid. Heart:  Regular  Rhythm; norm S1 & S2 w/ Gr1/6 SEM without rubs or gallops detected. Abdomen:  Soft & nontender- no guarding or rebound; normal bowel sounds; no organomegaly or masses palpated. Ext:  Sl decr ROM; without deformities +arthritic changes; no varicose veins, +venous insuffic, tr edema;  Pulses decr w/o bruits. Neuro:  CNs II-XII intact; motor testing normal; sensory testing normal; gait normal & balance OK. Derm:  No lesions noted; no rash etc. Lymph:  No cervical, supraclavicular, axillary, or inguinal adenopathy palpated.   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     ASPVD- s/p AAA stent graft 11/2012, mod bilat carotid occlusive dis (asymptomatic) w/ CDopplers showing ~50-60% bilat ICAstenoses>  Stable & seen by [facility] 01/2016- plans yearly f/u CT Abd and CDopplers    MEDICAL issues>  HBP, HL, GERD/ Divertic/ Polyps, CKD-stage3, Anemia & thrombocytopenia>  On Simva80-1/2Qd, Prilosec20/d, Fe/ MVI/ etc...  PLAN>>  02/29/16>   Problem list as above w/ signif cardiac, pulmonary, & post chemoradiation changes;  From the pulm standpoint- he feels the BREO is helping & hasn't needed the rescue inhaler very often, continue same;  He is on a long PRED taper per DrManning for poss radiation fibrosis RUL, Sed rate is 53, continue this taper & careful w/ sweets/ sugar etc;  From the cardiac standpoint- he has signif underlying dis w/ Lmain stent, s/p CABG 20 yrs ago, prob ischemic cardiomyopathy w/ worsening 2DEcho recently w/ 30-35% EF + HK&AK seen, BNP=1053, Cards plans ischemic work up when able, in the meanwhile he has improved w/ diuresis but [place] w/ mild RI & HCO3=36... REC- add DIAMOX-ER 500 one tab daily at 4pm, continue his Lasix40 Qam, increase water intake, watch weights... We plan ROV 59month recheck... 04/04/16>   Discussed w/ Jarid-- continue the Levaquin til gone, use Tylenol for fever or pain; continue the Breo daily & Ventolin-HFA as needed; wean off the Diamox at this time, continue Lasix40, no salt, etc; he needs to gradually increase his exercise/ mobility; we will plan ROV w/ labs in 84mo.  06/06/16>   He is feeling better, performance status improving, CXR shows progressive changes in RUL s/p chemoradiation; CT=>PET scans w/ vol loss & persistent hypermetabolism/ evolving changes in RUL & right hilum plus he has a mod large right effusion; prev thoracentesis 01/2016 in hosp was  transudative- I believe he would benefit from further eval/ repeat thoracentesis to recheck this fluid; we will set this up via IR & ask them to drain the fluid as much as poss, send it for Cytology, cell ct & diff, TProt/ LDH/ Gluc => 1.2L removed, prob exudate, NEG cytology, & we will follow... 08/06/16>   Brandan is clinically stable, no clear evid of recurrent dis & followed very closely by DrMohamed; Pulm stable on Symbicort Bid, Albut rescue prn, and his exercise- continue same 7 we plan rov recheck  in 30mo 11/05/16>    Lesley is doing satis on Symbicort160-2spBid & Albut rescue inhaler prn; he states that he is active, energy is good, & that he's getting stronger & better (good performance status); I suspect that he has residual cancer in the right chest but it appears slowly progressive (w/ sl incr right effusion & subcarinal LN) and DrMohamed to review status & decide regarding additional therapy at this point vs continued observation...  01/02/17>   Tammie is improved but it's been a slow process; he is at high risk for cancer progression & followed closely by DrMohamed who plans f/u CT & recheck in June;  Continue current meds, add Guaifenesin 2400mg /d + water to aid in mucus production... We will recheck in 61mo & review his f/u scans planned by Oncology. 02/04/17>   SEE ABOVE - Rx Pred taper & Levaquin=> no better; R-Thoracentesis by IR w/ only 280cc exudative loculated fluid removed, cytology- NEG; Limited options at this point- Rx w/ Augmentin empirically, add NEBs w/ Albuterol Tid, continue GFN 2400mg /d;  We will check 2DEcho and refer to TCTS to consider further Rx options for right effusion...   Plan:     Patient's Medications  New Prescriptions   LEVOFLOXACIN (LEVAQUIN) 750 MG TABLET    Take 1 tablet (750 mg total) by mouth daily.   PREDNISONE (DELTASONE) 20 MG TABLET    Take as directed  Previous Medications   ACETAMINOPHEN (TYLENOL) 500 MG TABLET    Take 500 mg by mouth every 6 (six) hours  as needed for mild pain.   ASPIRIN EC 81 MG TABLET    Take 81 mg by mouth daily.   BUDESONIDE-FORMOTEROL (SYMBICORT) 160-4.5 MCG/ACT INHALER    Inhale 2 puffs into the lungs 2 (two) times daily.   CLOPIDOGREL (PLAVIX) 75 MG TABLET    Take 1 tablet (75 mg total) by mouth daily. Reported on 12/02/2015   FERROUS SULFATE (IRON) 28 MG TABS    Take 28 mg by mouth daily.   FUROSEMIDE (LASIX) 40 MG TABLET    Take 1 tablet (40 mg total) by mouth daily.   METOPROLOL TARTRATE 75 MG TABS    Take 1 tablet by mouth 2 (two) times daily.   MULTIPLE VITAMINS-MINERALS (CENTRUM SILVER ADULT 50+) TABS    Take 1 tablet by mouth daily.    NITROGLYCERIN (NITROSTAT) 0.4 MG SL TABLET    Place 1 tablet (0.4 mg total) under the tongue every 5 (five) minutes as needed for chest pain.   OMEPRAZOLE (PRILOSEC) 20 MG CAPSULE    TAKE ONE CAPSULE BY MOUTH DAILY.   SIMVASTATIN (ZOCOR) 80 MG TABLET    Take 0.5 tablets (40 mg total) by mouth at bedtime.   VENTOLIN HFA 108 (90 BASE) MCG/ACT INHALER    INHALE 2 PUFFS BY MOUTH EVERY 4 TO 6 HOURS AS NEEDED FOR WHEEZING.  Modified Medications   No medications on file  Discontinued Medications   ONDANSETRON (ZOFRAN ODT) 4 MG DISINTEGRATING TABLET    Take 1 tablet (4 mg total) by mouth every 8 (eight) hours as needed for nausea or vomiting.

## 2017-02-06 ENCOUNTER — Other Ambulatory Visit: Payer: Medicare Other

## 2017-02-07 ENCOUNTER — Other Ambulatory Visit: Payer: Self-pay | Admitting: Pulmonary Disease

## 2017-02-07 DIAGNOSIS — J90 Pleural effusion, not elsewhere classified: Secondary | ICD-10-CM

## 2017-02-07 LAB — RESPIRATORY CULTURE OR RESPIRATORY AND SPUTUM CULTURE: Organism ID, Bacteria: NORMAL

## 2017-02-11 ENCOUNTER — Ambulatory Visit (HOSPITAL_COMMUNITY)
Admission: RE | Admit: 2017-02-11 | Discharge: 2017-02-11 | Disposition: A | Discharge status: Home / Self Care | Payer: Medicare Other | Source: Ambulatory Visit | Attending: Radiology | Admitting: Radiology

## 2017-02-11 ENCOUNTER — Ambulatory Visit (HOSPITAL_COMMUNITY)
Admission: RE | Admit: 2017-02-11 | Discharge: 2017-02-11 | Disposition: A | Discharge status: Home / Self Care | Payer: Medicare Other | Source: Ambulatory Visit | Attending: Pulmonary Disease | Admitting: Pulmonary Disease

## 2017-02-11 DIAGNOSIS — J90 Pleural effusion, not elsewhere classified: Secondary | ICD-10-CM | POA: Diagnosis not present

## 2017-02-11 DIAGNOSIS — J948 Other specified pleural conditions: Secondary | ICD-10-CM | POA: Diagnosis not present

## 2017-02-11 DIAGNOSIS — Z9889 Other specified postprocedural states: Secondary | ICD-10-CM | POA: Insufficient documentation

## 2017-02-11 LAB — BODY FLUID CELL COUNT WITH DIFFERENTIAL
Lymphs, Fluid: 87 %
Monocyte-Macrophage-Serous Fluid: 11 % — ABNORMAL LOW (ref 50–90)
Neutrophil Count, Fluid: 2 % (ref 0–25)
Total Nucleated Cell Count, Fluid: 290 cu mm (ref 0–1000)

## 2017-02-11 LAB — PROTEIN, PLEURAL OR PERITONEAL FLUID: Total protein, fluid: 3.1 g/dL

## 2017-02-11 LAB — LACTATE DEHYDROGENASE, PLEURAL OR PERITONEAL FLUID: LD FL: 203 U/L — AB (ref 3–23)

## 2017-02-11 NOTE — Procedures (Signed)
Ultrasound-guided diagnostic and therapeutic right thoracentesis performed yielding 280 cc of dark blood tinged fluid. No immediate complications. Follow-up chest x-ray pending. The fluid was sent to the lab for preordered studies. Due to the multiloculated nature of the collection only the above amount of fluid could be removed today.

## 2017-02-13 ENCOUNTER — Other Ambulatory Visit (HOSPITAL_BASED_OUTPATIENT_CLINIC_OR_DEPARTMENT_OTHER): Payer: Medicare Other

## 2017-02-13 ENCOUNTER — Other Ambulatory Visit: Payer: Medicare Other

## 2017-02-13 ENCOUNTER — Ambulatory Visit (HOSPITAL_COMMUNITY)
Admission: RE | Admit: 2017-02-13 | Discharge: 2017-02-13 | Disposition: A | Discharge status: Home / Self Care | Payer: Medicare Other | Source: Ambulatory Visit | Attending: Internal Medicine | Admitting: Internal Medicine

## 2017-02-13 ENCOUNTER — Ambulatory Visit: Payer: Medicare Other | Admitting: Pulmonary Disease

## 2017-02-13 ENCOUNTER — Telehealth: Payer: Self-pay | Admitting: Pulmonary Disease

## 2017-02-13 DIAGNOSIS — C3490 Malignant neoplasm of unspecified part of unspecified bronchus or lung: Secondary | ICD-10-CM | POA: Diagnosis not present

## 2017-02-13 DIAGNOSIS — C3411 Malignant neoplasm of upper lobe, right bronchus or lung: Secondary | ICD-10-CM

## 2017-02-13 DIAGNOSIS — R590 Localized enlarged lymph nodes: Secondary | ICD-10-CM | POA: Diagnosis not present

## 2017-02-13 DIAGNOSIS — Z923 Personal history of irradiation: Secondary | ICD-10-CM | POA: Insufficient documentation

## 2017-02-13 DIAGNOSIS — R918 Other nonspecific abnormal finding of lung field: Secondary | ICD-10-CM | POA: Diagnosis not present

## 2017-02-13 DIAGNOSIS — J90 Pleural effusion, not elsewhere classified: Secondary | ICD-10-CM | POA: Insufficient documentation

## 2017-02-13 DIAGNOSIS — I700 Atherosclerosis of aorta: Secondary | ICD-10-CM | POA: Insufficient documentation

## 2017-02-13 LAB — COMPREHENSIVE METABOLIC PANEL
ALK PHOS: 85 U/L (ref 40–150)
ALT: 18 U/L (ref 0–55)
AST: 17 U/L (ref 5–34)
Albumin: 3.1 g/dL — ABNORMAL LOW (ref 3.5–5.0)
Anion Gap: 13 mEq/L — ABNORMAL HIGH (ref 3–11)
BUN: 23 mg/dL (ref 7.0–26.0)
CO2: 30 mEq/L — ABNORMAL HIGH (ref 22–29)
Calcium: 9.4 mg/dL (ref 8.4–10.4)
Chloride: 98 mEq/L (ref 98–109)
Creatinine: 1.3 mg/dL (ref 0.7–1.3)
EGFR: 55 mL/min/{1.73_m2} — AB (ref >90)
GLUCOSE: 90 mg/dL (ref 70–140)
POTASSIUM: 3.7 meq/L (ref 3.5–5.1)
SODIUM: 141 meq/L (ref 136–145)
TOTAL PROTEIN: 7.3 g/dL (ref 6.4–8.3)
Total Bilirubin: 0.6 mg/dL (ref 0.20–1.20)

## 2017-02-13 LAB — CBC WITH DIFFERENTIAL/PLATELET
BASO%: 0.2 % (ref 0.0–2.0)
Basophils Absolute: 0 10*3/uL (ref 0.0–0.1)
EOS ABS: 0.1 10*3/uL (ref 0.0–0.5)
EOS%: 0.4 % (ref 0.0–7.0)
HCT: 39.6 % (ref 38.4–49.9)
HGB: 12.7 g/dL — ABNORMAL LOW (ref 13.0–17.1)
LYMPH%: 4 % — AB (ref 14.0–49.0)
MCH: 26.9 pg — ABNORMAL LOW (ref 27.2–33.4)
MCHC: 32 g/dL (ref 32.0–36.0)
MCV: 84 fL (ref 79.3–98.0)
MONO#: 1 10*3/uL — ABNORMAL HIGH (ref 0.1–0.9)
MONO%: 7 % (ref 0.0–14.0)
NEUT%: 88.4 % — ABNORMAL HIGH (ref 39.0–75.0)
NEUTROS ABS: 12.4 10*3/uL — AB (ref 1.5–6.5)
Platelets: 225 10*3/uL (ref 140–400)
RBC: 4.72 10*6/uL (ref 4.20–5.82)
RDW: 19.6 % — ABNORMAL HIGH (ref 11.0–14.6)
WBC: 14.1 10*3/uL — AB (ref 4.0–10.3)
lymph#: 0.6 10*3/uL — ABNORMAL LOW (ref 0.9–3.3)

## 2017-02-13 MED ORDER — IOPAMIDOL (ISOVUE-300) INJECTION 61%
INTRAVENOUS | Status: AC
  Filled 2017-02-13: qty 75

## 2017-02-13 MED ORDER — IOPAMIDOL (ISOVUE-300) INJECTION 61%
75.0000 mL | Freq: Once | INTRAVENOUS | Status: AC | PRN
  Administered 2017-02-13: 75 mL via INTRAVENOUS

## 2017-02-13 NOTE — Telephone Encounter (Signed)
Spoke with pt, who states during his OV with SN on 02/04/17. He was prescribed Levaquin 750 #7 and prednisone taper. Pt states he finished course of abx on Sunday, and will prednisone on 02/27/17. Pt reports of no improvement with abx. Pt reports of prod cough with yellow mucus, wheezing, increased sob & temp of 100 starting today.  SN please advise. Thanks.

## 2017-02-13 NOTE — Telephone Encounter (Signed)
Per SN- call pt and let him that SN will review scan that Dr. Julien Nordmann ordered and will discussed scan with  Dr. Julien Nordmann and will call pt tomorrow.  Pt is aware and voiced is understanding.  Nothing further needed at this time.

## 2017-02-14 ENCOUNTER — Other Ambulatory Visit: Payer: Self-pay

## 2017-02-14 DIAGNOSIS — I739 Peripheral vascular disease, unspecified: Secondary | ICD-10-CM

## 2017-02-14 DIAGNOSIS — I714 Abdominal aortic aneurysm, without rupture, unspecified: Secondary | ICD-10-CM

## 2017-02-14 DIAGNOSIS — I7771 Dissection of carotid artery: Secondary | ICD-10-CM

## 2017-02-14 DIAGNOSIS — I779 Disorder of arteries and arterioles, unspecified: Secondary | ICD-10-CM

## 2017-02-15 ENCOUNTER — Other Ambulatory Visit: Payer: Self-pay | Admitting: Pulmonary Disease

## 2017-02-15 ENCOUNTER — Telehealth: Payer: Self-pay | Admitting: Pulmonary Disease

## 2017-02-15 DIAGNOSIS — J449 Chronic obstructive pulmonary disease, unspecified: Secondary | ICD-10-CM

## 2017-02-15 DIAGNOSIS — J90 Pleural effusion, not elsewhere classified: Secondary | ICD-10-CM

## 2017-02-15 MED ORDER — ALBUTEROL SULFATE (2.5 MG/3ML) 0.083% IN NEBU: 2.5000 mg | INHALATION_SOLUTION | Freq: Three times a day (TID) | RESPIRATORY_TRACT | 0 refills | Status: DC

## 2017-02-15 MED ORDER — AMOXICILLIN-POT CLAVULANATE 875-125 MG PO TABS: 1.0000 | ORAL_TABLET | Freq: Two times a day (BID) | ORAL | 0 refills | Status: DC

## 2017-02-15 MED ORDER — PREDNISONE 20 MG PO TABS: ORAL_TABLET | ORAL | 6 refills | Status: DC

## 2017-02-15 NOTE — Telephone Encounter (Signed)
Neb machine was sent to [employer]. Spoke to pt's wife. He was on his way to pick up this machine. Nothing further was needed.

## 2017-02-18 ENCOUNTER — Other Ambulatory Visit: Payer: Self-pay | Admitting: Cardiovascular Disease

## 2017-02-18 ENCOUNTER — Emergency Department (HOSPITAL_COMMUNITY): Payer: Medicare Other

## 2017-02-18 ENCOUNTER — Emergency Department (HOSPITAL_COMMUNITY)
Admission: EM | Admit: 2017-02-18 | Discharge: 2017-02-19 | Disposition: A | Discharge status: Home / Self Care | Payer: Medicare Other | Attending: Emergency Medicine | Admitting: Emergency Medicine

## 2017-02-18 ENCOUNTER — Encounter (HOSPITAL_COMMUNITY): Payer: Self-pay

## 2017-02-18 DIAGNOSIS — I5042 Chronic combined systolic (congestive) and diastolic (congestive) heart failure: Secondary | ICD-10-CM | POA: Diagnosis not present

## 2017-02-18 DIAGNOSIS — Z951 Presence of aortocoronary bypass graft: Secondary | ICD-10-CM | POA: Insufficient documentation

## 2017-02-18 DIAGNOSIS — C3411 Malignant neoplasm of upper lobe, right bronchus or lung: Secondary | ICD-10-CM | POA: Diagnosis not present

## 2017-02-18 DIAGNOSIS — N183 Chronic kidney disease, stage 3 (moderate): Secondary | ICD-10-CM | POA: Insufficient documentation

## 2017-02-18 DIAGNOSIS — Z87891 Personal history of nicotine dependence: Secondary | ICD-10-CM | POA: Diagnosis not present

## 2017-02-18 DIAGNOSIS — M549 Dorsalgia, unspecified: Secondary | ICD-10-CM | POA: Insufficient documentation

## 2017-02-18 DIAGNOSIS — I130 Hypertensive heart and chronic kidney disease with heart failure and stage 1 through stage 4 chronic kidney disease, or unspecified chronic kidney disease: Secondary | ICD-10-CM | POA: Diagnosis not present

## 2017-02-18 DIAGNOSIS — I2510 Atherosclerotic heart disease of native coronary artery without angina pectoris: Secondary | ICD-10-CM | POA: Diagnosis not present

## 2017-02-18 DIAGNOSIS — R079 Chest pain, unspecified: Secondary | ICD-10-CM | POA: Diagnosis not present

## 2017-02-18 DIAGNOSIS — Z79899 Other long term (current) drug therapy: Secondary | ICD-10-CM | POA: Diagnosis not present

## 2017-02-18 DIAGNOSIS — Z7982 Long term (current) use of aspirin: Secondary | ICD-10-CM | POA: Insufficient documentation

## 2017-02-18 DIAGNOSIS — Z7902 Long term (current) use of antithrombotics/antiplatelets: Secondary | ICD-10-CM | POA: Diagnosis not present

## 2017-02-18 DIAGNOSIS — Z96642 Presence of left artificial hip joint: Secondary | ICD-10-CM | POA: Insufficient documentation

## 2017-02-18 DIAGNOSIS — J449 Chronic obstructive pulmonary disease, unspecified: Secondary | ICD-10-CM | POA: Insufficient documentation

## 2017-02-18 DIAGNOSIS — J90 Pleural effusion, not elsewhere classified: Secondary | ICD-10-CM | POA: Diagnosis not present

## 2017-02-18 DIAGNOSIS — R0602 Shortness of breath: Secondary | ICD-10-CM | POA: Diagnosis not present

## 2017-02-18 NOTE — ED Notes (Signed)
Pt in xray

## 2017-02-18 NOTE — ED Triage Notes (Signed)
Pt recently dx with lung cancer, has had chemo Pt states that he had fluid drained off and now he's feeling full again, he states that he has a dry cough for two days and tonight he's unable to lay flat

## 2017-02-18 NOTE — ED Provider Notes (Signed)
[facility] DEPT Provider Note   CSN: [id number] Arrival date & time: 02/18/17  2145  By signing my name below, I, Levester Fresh, attest that this documentation has been prepared under the direction and in the presence of Pollina, Gwenyth Allegra, * . Electronically Signed: Levester Fresh, Scribe. 02/18/2017. 11:33 PM.  History   Chief Complaint Chief Complaint  Patient presents with  . Shortness of Breath   HPI Comments Paul Sandoval is a 76 y.o. male with a PMHx significant for CAD (s/p CABG 1996, LHC 02/2014 s/p DES to dLM & staged PCI of SVG-diag), HTN, HLD, COPD, CKD stage III, NSCLC stage IIIA dx 10/2015 (T2b, N2, M0 - tx radiation, carboplatin, paclitaxel), who presents to the Emergency Department with complaints of dyspnea x1 day.  Associated coughing with pain to back.  Reports some wheezing.  Pt underwent a thoracentesis on 02/11/2017, and is concerned that the fluid has built back up.  Sx have been improved since onset, currently rated a 7/10 in severity.  Pt has an appt with cardiothoracic surgery scheduled for next wk.    The history is provided by the patient, medical records and the spouse. No language interpreter was used.    Past Medical History:  Diagnosis Date  . AAA (abdominal aortic aneurysm) ([place]) 2010   4.4 cm 08/2008;4.44 in 7/10 and 4.65 in 08/2009; 4.8 by CT in 11/2009; 4.3 by ultrasound in 08/2010  . Arteriosclerotic cardiovascular disease (ASCVD) 1996   CABG-1996  . Arthritis    "fingers" (03/18/2014)  . CAD (coronary artery disease)    03/18/14:  PCI with DES to distal left main. 7/29: DES to the SVG to Diag  . Cancer ([place])    Upper right lobe lung cancer  . Cardiomyopathy, ischemic    Echo 03/17/14: EF 45-50%  . Chronic bronchitis ([place])   . Chronic rhinitis   . Colonic polyp 2002   polypectomy in 2002  . COPD (chronic obstructive pulmonary disease) ([place])   . Diverticulosis   . ED (erectile dysfunction)   . Encounter for antineoplastic chemotherapy  12/19/2015  . GERD (gastroesophageal reflux disease)   . Hyperlipidemia   . Hypertension   . IFG (impaired fasting glucose)   . Myocardial infarction [facility])    "told h/o silent MI sometime before 1996"  . Pneumonia ~ 2001; ~ 2005  . Right bundle branch block   . Tobacco abuse, in remission    40 pack year total consumption; discontinued in 1996    Patient Active Problem List   Diagnosis Date Noted  . History of endovascular stent graft for abdominal aortic aneurysm (AAA) 01/02/2017  . S/P CABG (coronary artery bypass graft) 11/05/2016  . Cardiomyopathy, ischemic 11/05/2016  . Chronic combined systolic and diastolic congestive heart failure ([place]) 08/06/2016  . Sinus tachycardia 02/24/2016  . Pleural effusion   . SOB (shortness of breath)   . Acute on chronic combined systolic and diastolic heart failure ([place])   . Coronary artery disease due to lipid rich plaque   . Hypoalbuminemia due to protein-calorie malnutrition ([place]) 02/21/2016  . Fever, unspecified 02/21/2016  . Peripheral edema 02/17/2016  . Acute on chronic renal failure ([place]) 02/05/2016  . HTN (hypertension) 02/05/2016  . AAA (abdominal aortic aneurysm) without rupture ([place]) 01/30/2016  . Primary cancer of right upper lobe of lung ([place]) 11/24/2015  . CKD (chronic kidney disease) stage 3, GFR 30-59 ml/min 11/11/2015  . Thrombocytopenia ([place]) 02/17/2015  . Chronic obstructive airway disease with asthma ([place])  02/17/2015  . Other and unspecified angina pectoris 03/18/2014  . 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 Right carotid bruit 07/21/2013  . CAD (coronary artery disease) 01/19/2013  . AAA (abdominal aortic aneurysm) ([place]) 05/06/2012  . Cerebrovascular disease 12/18/2011  . GERD (gastroesophageal reflux disease)   . Arteriosclerotic cardiovascular disease (ASCVD) 12/06/2009  . Elevated lipids 12/05/2009  . Essential hypertension 12/05/2009    Past Surgical History:  Procedure Laterality Date  . ABDOMINAL  AORTIC ANEURYSM REPAIR  11/2012  . ABDOMINAL AORTIC ENDOVASCULAR STENT GRAFT N/A 12/11/2012   Procedure: ABDOMINAL AORTIC ENDOVASCULAR STENT GRAFT;  Surgeon: Mal Misty, MD;  Location: [facility];  Service: Vascular;  Laterality: N/A;  Ultrasound guided; [place]  . CARDIAC CATHETERIZATION  01/08/1995  . COLONOSCOPY  2002   polypectomy-patient denies  . CORONARY ANGIOPLASTY WITH STENT PLACEMENT  03/18/2014   "1"  . CORONARY ANGIOPLASTY WITH STENT PLACEMENT  03/24/2014   "1"  . CORONARY ARTERY BYPASS GRAFT  01/09/1995   "CABG X3"  . JOINT REPLACEMENT    . LAPAROSCOPIC CHOLECYSTECTOMY  12/2009  . LEFT AND RIGHT HEART CATHETERIZATION WITH CORONARY/GRAFT ANGIOGRAM N/A 03/18/2014   Procedure: LEFT AND RIGHT HEART CATHETERIZATION WITH Beatrix Fetters;  Surgeon: Blane Ohara, MD;  Location: [facility] CATH LAB;  Service: Cardiovascular;  Laterality: N/A;  . PERCUTANEOUS CORONARY STENT INTERVENTION (PCI-S)  03/18/2014   Procedure: PERCUTANEOUS CORONARY STENT INTERVENTION (PCI-S);  Surgeon: Blane Ohara, MD;  Location: [facility] CATH LAB;  Service: Cardiovascular;;  . PERCUTANEOUS CORONARY STENT INTERVENTION (PCI-S) N/A 03/24/2014   Procedure: PERCUTANEOUS CORONARY STENT INTERVENTION (PCI-S);  Surgeon: Blane Ohara, MD;  Location: [facility] CATH LAB;  Service: Cardiovascular;  Laterality: N/A;  . TOTAL HIP ARTHROPLASTY Left 01/21/2013   Procedure: TOTAL HIP ARTHROPLASTY ANTERIOR APPROACH;  Surgeon: Mauri Pole, MD;  Location: [facility];  Service: Orthopedics;  Laterality: Left;  Marland Kitchen VIDEO BRONCHOSCOPY N/A 11/17/2015   Procedure: VIDEO BRONCHOSCOPY WITH FLUORO;  Surgeon: Rigoberto Noel, MD;  Location: [facility];  Service: Cardiopulmonary;  Laterality: N/A;       Home Medications    Prior to Admission medications   Medication Sig Start Date End Date Taking? Authorizing Provider  acetaminophen (TYLENOL) 500 MG tablet Take 500 mg by mouth every 6 (six) hours as needed for mild pain.   Yes [provider]    albuterol (PROVENTIL) (2.5 MG/3ML) 0.083% nebulizer solution Take 3 mLs (2.5 mg total) by nebulization 3 (three) times daily. 02/15/17  Yes Noralee Space, MD  amoxicillin-clavulanate (AUGMENTIN) 875-125 MG tablet Take 1 tablet by mouth 2 (two) times daily. 02/15/17  Yes Noralee Space, MD  aspirin EC 81 MG tablet Take 81 mg by mouth daily.   Yes [provider]  budesonide-formoterol (SYMBICORT) 160-4.5 MCG/ACT inhaler Inhale 2 puffs into the lungs 2 (two) times daily.   Yes [provider]  clopidogrel (PLAVIX) 75 MG tablet TAKE ONE TABLET BY MOUTH DAILY. 02/18/17  Yes Josue Hector, MD  Ferrous Sulfate (IRON) 28 MG TABS Take 28 mg by mouth daily.   Yes [provider]  furosemide (LASIX) 40 MG tablet Take 1 tablet (40 mg total) by mouth daily. 03/19/16  Yes [name], MD  Metoprolol Tartrate 75 MG TABS Take 1 tablet by mouth 2 (two) times daily. 05/09/16  Yes [name], MD  Multiple Vitamins-Minerals (CENTRUM SILVER ADULT 50+) TABS Take 1 tablet by mouth daily.    Yes [provider]  omeprazole (PRILOSEC) 20  MG capsule TAKE ONE CAPSULE BY MOUTH DAILY. 10/17/16  Yes [name], MD  predniSONE (DELTASONE) 20 MG tablet Take as directed Patient taking differently: Take 10 mg by mouth daily. Taper 02/04/17  Yes Noralee Space, MD  simvastatin (ZOCOR) 80 MG tablet Take 0.5 tablets (40 mg total) by mouth at bedtime. 08/23/15  Yes [name], MD  VENTOLIN HFA 108 (90 Base) MCG/ACT inhaler INHALE 2 PUFFS BY MOUTH EVERY 4 TO 6 HOURS AS NEEDED FOR WHEEZING. 01/15/17  Yes [name], MD  levofloxacin (LEVAQUIN) 750 MG tablet Take 1 tablet (750 mg total) by mouth daily. Patient not taking: Reported on 02/18/2017 02/04/17   Noralee Space, MD  nitroGLYCERIN (NITROSTAT) 0.4 MG SL tablet Place 1 tablet (0.4 mg total) under the tongue every 5 (five) minutes as needed for chest pain. 03/23/15   Josue Hector, MD  ondansetron (ZOFRAN-ODT) 4 MG  disintegrating tablet Take 4 mg by mouth every 8 (eight) hours as needed for nausea or vomiting.  01/05/17   [provider]  predniSONE (DELTASONE) 20 MG tablet Take as directed by Dr. Lenna Gilford Patient not taking: Reported on 02/18/2017 02/15/17   Noralee Space, MD    Family History Family History  Problem Relation Age of Onset  . Heart disease Father   . Cancer Father        Lung  . Arthritis Mother   . Parkinsonism Mother   . Arthritis Sister        Brother with rheumatoid arthritis  . Hypertension Brother   . Colon cancer Neg Hx   . Colon polyps Neg Hx     Social History Social History  Substance Use Topics  . Smoking status: Former Smoker    Packs/day: 1.50    Years: 30.00    Types: Cigarettes    Start date: 12/01/1956    Quit date: 01/08/1995  . Smokeless tobacco: Never Used  . Alcohol use 0.0 oz/week     Comment: 03/18/2014 "no alacohol since 1996"     Allergies   Neomycin   Review of Systems Review of Systems  Constitutional: Negative for chills and fever.  HENT: Negative for congestion and sore throat.   Respiratory: Positive for cough, shortness of breath and wheezing.   Cardiovascular: Negative for chest pain.  Gastrointestinal: Negative for abdominal pain, constipation, diarrhea, nausea and vomiting.  Genitourinary: Negative for difficulty urinating.  Musculoskeletal: Positive for back pain.  All other systems reviewed and are negative.  Physical Exam Updated Vital Signs BP (!) 142/85 (BP Location: Right Arm)   Pulse 89   Temp 97.5 F (36.4 C) (Oral)   Resp 16   SpO2 96%   Physical Exam  Constitutional: He is oriented to person, place, and time. He appears well-developed and well-nourished. No distress.  HENT:  Head: Normocephalic and atraumatic.  Right Ear: Hearing normal.  Left Ear: Hearing normal.  Nose: Nose normal.  Mouth/Throat: Oropharynx is clear and moist and mucous membranes are normal.  Eyes: Conjunctivae and EOM are normal.  Pupils are equal, round, and reactive to light.  Neck: Normal range of motion. Neck supple.  Cardiovascular: Regular rhythm, S1 normal and S2 normal.  Exam reveals no gallop and no friction rub.   No murmur heard. Pulmonary/Chest: Effort normal. No respiratory distress. He has no wheezes. He exhibits no tenderness.  Absent breath sounds at the right base  Abdominal: Soft. Normal appearance and bowel sounds are normal. There is no hepatosplenomegaly. There  is no tenderness. There is no rebound, no guarding, no tenderness at McBurney's point and negative Murphy's sign. No hernia.  Musculoskeletal: Normal range of motion.  Neurological: He is alert and oriented to person, place, and time. He has normal strength. No cranial nerve deficit or sensory deficit. Coordination normal. GCS eye subscore is 4. GCS verbal subscore is 5. GCS motor subscore is 6.  Skin: Skin is warm, dry and intact. No rash noted. No cyanosis.  Psychiatric: He has a normal mood and affect. His speech is normal and behavior is normal. Thought content normal.  Nursing note and vitals reviewed.  ED Treatments / Results  DIAGNOSTIC STUDIES: Oxygen Saturation is 98% on room air, adequate by my interpretation.    COORDINATION OF CARE: 11:18 PM Discussed treatment plan with pt at bedside and pt agreed to plan.  Labs (all labs ordered are listed, but only abnormal results are displayed) Labs Reviewed - No data to display  EKG  EKG Interpretation None       Radiology Dg Chest 2 View  Result Date: 02/18/2017 CLINICAL DATA:  Chest pain and shortness of breath for 1 day. History of lung cancer. EXAM: CHEST  2 VIEW COMPARISON:  02/13/2017 CT and prior studies FINDINGS: Cardiomegaly and CABG changes again noted. Moderate loculated right pleural effusion at the apex and lung base noted. Soft tissue density in the right perihilar region is unchanged. There is no evidence of pneumothorax. There has been little interval change since  the prior study. IMPRESSION: Little significant change since 02/13/2017 CT. Continued moderate loculated right pleural effusion and posttreatment changes. Electronically Signed   By: Margarette Canada M.D.   On: 02/18/2017 22:45    Procedures Procedures (including critical care time)  Medications Ordered in ED Medications - No data to display   Initial Impression / Assessment and Plan / ED Course  I have reviewed the triage vital signs and the nursing notes.  Pertinent labs & imaging results that were available during my care of the patient were reviewed by me and considered in my medical decision making (see chart for details).     Patient presents to the ER for evaluation of sensation of feeling short of breath and pain when he breathes. Patient has a known loculated right pleural effusion. He was concerned that his effusion had increased. Chest x-ray is unchanged from CT scan on June 20. Patient is not hypoxic or in any respiratory distress. I discussed with the patient that I did not feel that there is any work or further workup necessary at this time. He is not febrile, no evidence of pneumonia. He is already on antibiotics, prednisone, bronchodilator therapy. No wheezing currently. Patient has follow-up with his oncologist in 2 days and follow-up with cardiothoracic surgery next week.  I personally performed the services described in this documentation, which was scribed in my presence. The recorded information has been reviewed and is accurate.   Final Clinical Impressions(s) / ED Diagnoses   Final diagnoses:  Pleural effusion    New Prescriptions New Prescriptions   No medications on file     Orpah Greek, MD 02/18/17 2342

## 2017-02-20 ENCOUNTER — Ambulatory Visit (HOSPITAL_BASED_OUTPATIENT_CLINIC_OR_DEPARTMENT_OTHER): Payer: Medicare Other | Admitting: Internal Medicine

## 2017-02-20 ENCOUNTER — Encounter: Payer: Self-pay | Admitting: Internal Medicine

## 2017-02-20 VITALS — BP 133/68 | HR 94 | Temp 97.6°F | Resp 18 | Ht 70.0 in | Wt 175.5 lb

## 2017-02-20 DIAGNOSIS — C3411 Malignant neoplasm of upper lobe, right bronchus or lung: Secondary | ICD-10-CM

## 2017-02-20 NOTE — Progress Notes (Signed)
[place] [phone]   [phone]  OFFICE PROGRESS NOTE  [name], MD [address] [place] [address]  DIAGNOSIS: Stage IIIA (T2b, N2, M0) non-small cell lung cancer, favoring squamous cell carcinoma presented with right upper lobe lung mass in addition to mediastinal lymphadenopathy diagnosed in March 2017.  PRIOR THERAPY:  A course of concurrent chemoradiation with weekly carboplatin for AUC of 2 and paclitaxel 45 MG/M2. Status post 5 cycle with partial response.  CURRENT THERAPY: Observation  INTERVAL HISTORY: Paul Sandoval 76 y.o. male returns to the clinic today for follow-up visit. The patient has been complaining of increasing shortness breath and cough recently. He was seen by his primary care physician Dr. Lenna Gilford and was treated with 2 courses of antibiotics initially with Levaquin and then Augmentin he has 2 more days of treatment with Augmentin. He was also started on a tapering dose of prednisone. He denied having any chest pain or hemoptysis. He denied having any fever or chills. He has no nausea, vomiting, diarrhea or constipation. He denied having any weight loss or night sweats. He was referred to Dr. Cyndia Bent for reevaluation of the loculated pleural effusion. He had repeat CT scan of the chest performed recently and he is here for evaluation and discussion of his scan results.   MEDICAL HISTORY: Past Medical History:  Diagnosis Date  . AAA (abdominal aortic aneurysm) ([place]) 2010   4.4 cm 08/2008;4.44 in 7/10 and 4.65 in 08/2009; 4.8 by CT in 11/2009; 4.3 by ultrasound in 08/2010  . Arteriosclerotic cardiovascular disease (ASCVD) 1996   CABG-1996  . Arthritis    "fingers" (03/18/2014)  . CAD (coronary artery disease)    03/18/14:  PCI with DES to distal left main. 7/29: DES to the SVG to Diag  . Cancer ([place])    Upper right lobe lung cancer  . Cardiomyopathy, ischemic    Echo 03/17/14: EF 45-50%  . Chronic  bronchitis ([place])   . Chronic rhinitis   . Colonic polyp 2002   polypectomy in 2002  . COPD (chronic obstructive pulmonary disease) ([place])   . Diverticulosis   . ED (erectile dysfunction)   . Encounter for antineoplastic chemotherapy 12/19/2015  . GERD (gastroesophageal reflux disease)   . Hyperlipidemia   . Hypertension   . IFG (impaired fasting glucose)   . Myocardial infarction [facility])    "told h/o silent MI sometime before 1996"  . Pneumonia ~ 2001; ~ 2005  . Right bundle branch block   . Tobacco abuse, in remission    40 pack year total consumption; discontinued in 1996    ALLERGIES:  is allergic to neomycin.  MEDICATIONS:  Current Outpatient Prescriptions  Medication Sig Dispense Refill  . albuterol (PROVENTIL) (2.5 MG/3ML) 0.083% nebulizer solution Take 3 mLs (2.5 mg total) by nebulization 3 (three) times daily. 270 mL 0  . amoxicillin-clavulanate (AUGMENTIN) 875-125 MG tablet Take 1 tablet by mouth 2 (two) times daily. 20 tablet 0  . aspirin EC 81 MG tablet Take 81 mg by mouth daily.    . budesonide-formoterol (SYMBICORT) 160-4.5 MCG/ACT inhaler Inhale 2 puffs into the lungs 2 (two) times daily.    . clopidogrel (PLAVIX) 75 MG tablet TAKE ONE TABLET BY MOUTH DAILY. 90 tablet 2  . Ferrous Sulfate (IRON) 28 MG TABS Take 28 mg by mouth daily.    . furosemide (LASIX) 40 MG tablet Take 1 tablet (40 mg total) by mouth daily. 30 tablet  11  . Metoprolol Tartrate 75 MG TABS Take 1 tablet by mouth 2 (two) times daily. 180 tablet 1  . Multiple Vitamins-Minerals (CENTRUM SILVER ADULT 50+) TABS Take 1 tablet by mouth daily.     Marland Kitchen omeprazole (PRILOSEC) 20 MG capsule TAKE ONE CAPSULE BY MOUTH DAILY. 90 capsule 1  . predniSONE (DELTASONE) 20 MG tablet Take as directed (Patient taking differently: Take 10 mg by mouth daily. Taper) 20 tablet 0  . simvastatin (ZOCOR) 80 MG tablet Take 0.5 tablets (40 mg total) by mouth at bedtime. 15 tablet 5  . VENTOLIN HFA 108 (90 Base) MCG/ACT inhaler INHALE  2 PUFFS BY MOUTH EVERY 4 TO 6 HOURS AS NEEDED FOR WHEEZING. 18 g 0  . acetaminophen (TYLENOL) 500 MG tablet Take 500 mg by mouth every 6 (six) hours as needed for mild pain.    . nitroGLYCERIN (NITROSTAT) 0.4 MG SL tablet Place 1 tablet (0.4 mg total) under the tongue every 5 (five) minutes as needed for chest pain. (Patient not taking: Reported on 02/20/2017) 25 tablet 4  . ondansetron (ZOFRAN-ODT) 4 MG disintegrating tablet Take 4 mg by mouth every 8 (eight) hours as needed for nausea or vomiting.      No current facility-administered medications for this visit.     SURGICAL HISTORY:  Past Surgical History:  Procedure Laterality Date  . ABDOMINAL AORTIC ANEURYSM REPAIR  11/2012  . ABDOMINAL AORTIC ENDOVASCULAR STENT GRAFT N/A 12/11/2012   Procedure: ABDOMINAL AORTIC ENDOVASCULAR STENT GRAFT;  Surgeon: Mal Misty, MD;  Location: [facility];  Service: Vascular;  Laterality: N/A;  Ultrasound guided; [place]  . CARDIAC CATHETERIZATION  01/08/1995  . COLONOSCOPY  2002   polypectomy-patient denies  . CORONARY ANGIOPLASTY WITH STENT PLACEMENT  03/18/2014   "1"  . CORONARY ANGIOPLASTY WITH STENT PLACEMENT  03/24/2014   "1"  . CORONARY ARTERY BYPASS GRAFT  01/09/1995   "CABG X3"  . JOINT REPLACEMENT    . LAPAROSCOPIC CHOLECYSTECTOMY  12/2009  . LEFT AND RIGHT HEART CATHETERIZATION WITH CORONARY/GRAFT ANGIOGRAM N/A 03/18/2014   Procedure: LEFT AND RIGHT HEART CATHETERIZATION WITH Beatrix Fetters;  Surgeon: Blane Ohara, MD;  Location: [facility] CATH LAB;  Service: Cardiovascular;  Laterality: N/A;  . PERCUTANEOUS CORONARY STENT INTERVENTION (PCI-S)  03/18/2014   Procedure: PERCUTANEOUS CORONARY STENT INTERVENTION (PCI-S);  Surgeon: Blane Ohara, MD;  Location: [facility] CATH LAB;  Service: Cardiovascular;;  . PERCUTANEOUS CORONARY STENT INTERVENTION (PCI-S) N/A 03/24/2014   Procedure: PERCUTANEOUS CORONARY STENT INTERVENTION (PCI-S);  Surgeon: Blane Ohara, MD;  Location: [facility] CATH LAB;  Service:  Cardiovascular;  Laterality: N/A;  . TOTAL HIP ARTHROPLASTY Left 01/21/2013   Procedure: TOTAL HIP ARTHROPLASTY ANTERIOR APPROACH;  Surgeon: Mauri Pole, MD;  Location: [facility];  Service: Orthopedics;  Laterality: Left;  Marland Kitchen VIDEO BRONCHOSCOPY N/A 11/17/2015   Procedure: VIDEO BRONCHOSCOPY WITH FLUORO;  Surgeon: Rigoberto Noel, MD;  Location: [place] [facility];  Service: Cardiopulmonary;  Laterality: N/A;    REVIEW OF SYSTEMS:  A comprehensive review of systems was negative except for: Constitutional: positive for fatigue Respiratory: positive for cough, dyspnea on exertion and sputum   PHYSICAL EXAMINATION: General appearance: alert, cooperative, fatigued and no distress Head: Normocephalic, without obvious abnormality, atraumatic Neck: no adenopathy, no JVD, supple, symmetrical, trachea midline and thyroid not enlarged, symmetric, no tenderness/mass/nodules Lymph nodes: Cervical, supraclavicular, and axillary nodes normal. Resp: diminished breath sounds RLL, dullness to percussion RLL and wheezes RLL Back: symmetric, no curvature. ROM normal. No CVA tenderness. Cardio: regular rate  and rhythm, S1, S2 normal, no murmur, click, rub or gallop GI: soft, non-tender; bowel sounds normal; no masses,  no organomegaly Extremities: extremities normal, atraumatic, no cyanosis or edema  ECOG PERFORMANCE STATUS: 1 - Symptomatic but completely ambulatory  Blood pressure 133/68, pulse 94, temperature 97.6 F (36.4 C), temperature source Oral, resp. rate 18, height 5\' 10"  (1.778 m), weight 175 lb 8 oz (79.6 kg), SpO2 97 %.  LABORATORY DATA: Lab Results  Component Value Date   WBC 14.1 (H) 02/13/2017   HGB 12.7 (L) 02/13/2017   HCT 39.6 02/13/2017   MCV 84.0 02/13/2017   PLT 225 02/13/2017      Chemistry      Component Value Date/Time   NA 141 02/13/2017 0800   K 3.7 02/13/2017 0800   CL 96 (L) 01/05/2017 0918   CO2 30 (H) 02/13/2017 0800   BUN 23.0 02/13/2017 0800   CREATININE 1.3 02/13/2017 0800       Component Value Date/Time   CALCIUM 9.4 02/13/2017 0800   ALKPHOS 85 02/13/2017 0800   AST 17 02/13/2017 0800   ALT 18 02/13/2017 0800   BILITOT 0.60 02/13/2017 0800       RADIOGRAPHIC STUDIES: Dg Chest 1 View  Result Date: 02/11/2017 CLINICAL DATA:  Status post right-sided thoracentesis. EXAM: CHEST 1 VIEW COMPARISON:  02/04/2017 FINDINGS: The right hemithorax appears relatively stable. There may be a slight decrease in the right-sided pleural effusion. Persistent probable loculated pleural effusion extending up over the right apex. Extensive scarring changes noted in the right long medially likely due to radiation fibrosis. Stable underlying emphysematous changes and pulmonary scarring. The left lung remains relatively clear. IMPRESSION: Relatively stable appearance of the right hemithorax. There are radiation changes and a chronic loculated pleural fluid collection. No pneumothorax. Electronically Signed   By: Marijo Sanes M.D.   On: 02/11/2017 10:54   Dg Chest 2 View  Result Date: 02/18/2017 CLINICAL DATA:  Chest pain and shortness of breath for 1 day. History of lung cancer. EXAM: CHEST  2 VIEW COMPARISON:  02/13/2017 CT and prior studies FINDINGS: Cardiomegaly and CABG changes again noted. Moderate loculated right pleural effusion at the apex and lung base noted. Soft tissue density in the right perihilar region is unchanged. There is no evidence of pneumothorax. There has been little interval change since the prior study. IMPRESSION: Little significant change since 02/13/2017 CT. Continued moderate loculated right pleural effusion and posttreatment changes. Electronically Signed   By: Margarette Canada M.D.   On: 02/18/2017 22:45   Dg Chest 2 View  Result Date: 02/04/2017 CLINICAL DATA:  Cough, congestion, wheezing EXAM: CHEST  2 VIEW COMPARISON:  CT chest 10/30/2016 FINDINGS: Loculated small-moderate right pleural effusion. Right upper lobe airspace disease unchanged compared with  10/30/2016 most consistent with posttreatment changes. Superimposed infection cannot be excluded. Left lung is clear. No pneumothorax. No left pleural effusion. Stable cardiomediastinal silhouette. Prior CABG. No acute osseous abnormality. IMPRESSION: Stable loculated small-moderate right pleural effusion. Right upper lobe airspace disease unchanged compared with 10/30/2016 most consistent with posttreatment changes. Superimposed infection cannot be excluded. Electronically Signed   By: Kathreen Devoid   On: 02/04/2017 12:30   Ct Chest W Contrast  Result Date: 02/13/2017 CLINICAL DATA:  Followup lung cancer EXAM: CT CHEST WITH CONTRAST TECHNIQUE: Multidetector CT imaging of the chest was performed during intravenous contrast administration. CONTRAST:  63mL ISOVUE-300 IOPAMIDOL (ISOVUE-300) INJECTION 61% COMPARISON:  10/30/2016 FINDINGS: Cardiovascular: The heart size appears within normal limits. There is no  pericardial effusion identified. Previous median sternotomy and CABG procedure. Aortic atherosclerosis noted. Mediastinum/Nodes: The trachea appears patent and is midline. Normal appearance of the esophagus. Index sub- carinal lymph node measures 1.2 cm, image 67 of series 2. Lungs/Pleura: Moderate to large loculated right pleural effusion is again identified. The component overlying the right lower lobe now has multiple foci of gas which likely reflects recent instrumentation. Masslike architectural distortion within the anteromedial right upper lobe is unchanged when compared with the previous exam compatible with changes secondary to external beam radiation. There is a new area of masslike airspace consolidation in the right middle lobe which measures 3 cm, image 105 of series 5. Multiple faint nodular densities are scattered throughout the right lower lung as well. New scattered nodular densities within the medial left lower lobe also identified. Upper Abdomen: No acute abnormality. Musculoskeletal: No  chest wall abnormality. No acute or significant osseous findings. IMPRESSION: 1. Since the previous exam there has been development of right middle lobe masslike consolidation and scattered nonspecific nodularity within both lower lobes. Inflammatory or infectious process. Aspiration not excluded. Short-term followup imaging following antibiotic therapy is advised to ensure resolution. 2. Similar appearance of loculated right pleural effusion 3. Changes of external beam radiation within the medial right apex is similar to previous exam. 4. Stable borderline enlarged sub- carinal lymph node 5.  Aortic Atherosclerosis (ICD10-I70.0) Electronically Signed   By: Kerby Moors M.D.   On: 02/13/2017 10:35   US Thoracentesis Asp Pleural Space W/img Guide  Result Date: 02/11/2017 INDICATION: Lung cancer, recurrent right pleural effusion; request received for diagnostic and therapeutic right thoracentesis. EXAM: ULTRASOUND GUIDED DIAGNOSTIC AND THERAPEUTIC RIGHT THORACENTESIS MEDICATIONS: None. COMPLICATIONS: None immediate. PROCEDURE: An ultrasound guided thoracentesis was thoroughly discussed with the patient and questions answered. The benefits, risks, alternatives and complications were also discussed. The patient understands and wishes to proceed with the procedure. Written consent was obtained. Ultrasound was performed to localize and mark an adequate pocket of fluid in the right chest. The area was then prepped and draped in the normal sterile fashion. 1% Lidocaine was used for local anesthesia. Under ultrasound guidance a Safe-T-Centesis catheter was introduced. Thoracentesis was performed. The catheter was removed and a dressing applied. FINDINGS: A total of approximately 280 cc of dark, bloody fluid was removed. Samples were sent to the laboratory as requested by the clinical team. Due to the multiloculated nature of the collection only the above amount of fluid could be removed today. IMPRESSION: Successful  ultrasound guided diagnostic and therapeutic right thoracentesis yielding 280 cc of pleural fluid. Read by: Rowe Robert, PA-C Electronically Signed   By: Lucrezia Europe M.D.   On: 02/11/2017 11:10    ASSESSMENT AND PLAN:  This is a very pleasant 76 years old white male with a stage IIIa non-small cell lung cancer status post a course of concurrent chemoradiation with weekly carboplatin and paclitaxel and he had a rough time with the treatment at that time. He did not receive consolidation chemotherapy. The patient is currently on observation and recent CT scan of the chest, abdomen and pelvis showed no evidence for disease recurrence but he has a lot of inflammatory changes in the right lower lobe with similar loculated pleural effusion. He is currently on treatment with antibiotic and a tapered dose of prednisone. I discussed the scan results with the patient today. I recommended for him to continue on observation with repeat CT scan of the chest in 3 months for reevaluation of his disease.  He was advised to call immediately if he has any concerning symptoms in the interval. The patient voices understanding of current disease status and treatment options and is in agreement with the current care plan.  All questions were answered. The patient knows to call the clinic with any problems, questions or concerns. We can certainly see the patient much sooner if necessary. I spent 10 minutes counseling the patient face to face. The total time spent in the appointment was 15 minutes.  Disclaimer: This note was dictated with voice recognition software. Similar sounding words can inadvertently be transcribed and may not be corrected upon review.

## 2017-02-22 ENCOUNTER — Ambulatory Visit: Payer: Medicare Other | Admitting: Vascular Surgery

## 2017-02-22 ENCOUNTER — Ambulatory Visit (HOSPITAL_COMMUNITY): Payer: Medicare Other

## 2017-02-25 ENCOUNTER — Other Ambulatory Visit: Payer: Self-pay | Admitting: *Deleted

## 2017-02-25 ENCOUNTER — Institutional Professional Consult (permissible substitution) (INDEPENDENT_AMBULATORY_CARE_PROVIDER_SITE_OTHER): Payer: Medicare Other | Admitting: Surgery

## 2017-02-25 ENCOUNTER — Encounter: Payer: Self-pay | Admitting: Surgery

## 2017-02-25 ENCOUNTER — Telehealth: Payer: Self-pay | Admitting: Internal Medicine

## 2017-02-25 VITALS — BP 120/63 | HR 100 | Resp 20 | Ht 70.0 in | Wt 175.0 lb

## 2017-02-25 DIAGNOSIS — Z951 Presence of aortocoronary bypass graft: Secondary | ICD-10-CM

## 2017-02-25 DIAGNOSIS — J90 Pleural effusion, not elsewhere classified: Secondary | ICD-10-CM

## 2017-02-25 DIAGNOSIS — Z85118 Personal history of other malignant neoplasm of bronchus and lung: Secondary | ICD-10-CM | POA: Diagnosis not present

## 2017-02-25 NOTE — Telephone Encounter (Signed)
Scheduled appt per 6/27 los - sent reminder letter in the mail and left message

## 2017-02-26 ENCOUNTER — Ambulatory Visit (HOSPITAL_BASED_OUTPATIENT_CLINIC_OR_DEPARTMENT_OTHER): Payer: Medicare Other

## 2017-02-26 ENCOUNTER — Other Ambulatory Visit: Payer: Self-pay

## 2017-02-26 DIAGNOSIS — I069 Rheumatic aortic valve disease, unspecified: Secondary | ICD-10-CM | POA: Insufficient documentation

## 2017-02-26 DIAGNOSIS — I5030 Unspecified diastolic (congestive) heart failure: Secondary | ICD-10-CM | POA: Insufficient documentation

## 2017-02-26 DIAGNOSIS — J90 Pleural effusion, not elsewhere classified: Secondary | ICD-10-CM | POA: Diagnosis not present

## 2017-02-26 DIAGNOSIS — I420 Dilated cardiomyopathy: Secondary | ICD-10-CM | POA: Insufficient documentation

## 2017-02-26 DIAGNOSIS — J449 Chronic obstructive pulmonary disease, unspecified: Secondary | ICD-10-CM | POA: Diagnosis not present

## 2017-02-26 NOTE — Pre-Procedure Instructions (Signed)
Paul Sandoval  02/26/2017      [facility] - [place], [address] [place] [address] Phone: [phone] Fax: [phone]    Your procedure is scheduled on March 01, 2017.  Report to [facility] Admitting at 5:30 A.M.  Call this number if you have problems the morning of surgery:  [phone]   Remember:  Do not eat food or drink liquids after midnight on February 28, 2017  Take these medicines the morning of surgery with A SIP OF WATER              Metoprolol Tartrate, and Omeprazole (PRILOSEC). If needed  Acetaminophen (TYLENOL), NitroGLYCERIN (NITROSTAT), Ondansetron (ZOFRAN-ODT),  Albuterol (PROVENTIL) nebulizer, and VENTOLIN HFA inhaler, bring inhaler with you the day of surgery.  Stop taking NOW all Aspirin, Vitamins, Fish oils, and Herbal medicaitons. Also stop all NSAIDS i.e. Advil, Motrin, Aleve, Anaprox, Naproxen, BC and Goody Powders.   Do not wear jewelry, make-up or nail polish.  Do not wear lotions, powders, or perfumes, or deoderant.  Do not shave 48 hours prior to surgery.  Men may shave face and neck.  Do not bring valuables to the hospital.  [facility] is not responsible for any belongings or valuables.  Contacts, dentures or bridgework may not be worn into surgery.  Leave your suitcase in the car.  After surgery it may be brought to your room.  For patients admitted to the hospital, discharge time will be determined by your treatment team.   Special instructions:    [facility]- Preparing For Surgery  Before surgery, you can play an important role. Because skin is not sterile, your skin needs to be as free of germs as possible. You can reduce the number of germs on your skin by washing with CHG (chlorahexidine gluconate) Soap before surgery.  CHG is an antiseptic cleaner which kills germs and bonds with the skin to continue killing germs even after washing.  Please do not use if you have an allergy to CHG or  antibacterial soaps. If your skin becomes reddened/irritated stop using the CHG.  Do not shave (including legs and underarms) for at least 48 hours prior to first CHG shower. It is OK to shave your face.  Please follow these instructions carefully.   1. Shower the NIGHT BEFORE SURGERY and the MORNING OF SURGERY with CHG.   2. If you chose to wash your hair, wash your hair first as usual with your normal shampoo.  3. After you shampoo, rinse your hair and body thoroughly to remove the shampoo.  4. Use CHG as you would any other liquid soap. You can apply CHG directly to the skin and wash gently with a scrungie or a clean washcloth.   5. Apply the CHG Soap to your body ONLY FROM THE NECK DOWN.  Do not use on open wounds or open sores. Avoid contact with your eyes, ears, mouth and genitals (private parts). Wash genitals (private parts) with your normal soap.  6. Wash thoroughly, paying special attention to the area where your surgery will be performed.  7. Thoroughly rinse your body with warm water from the neck down.  8. DO NOT shower/wash with your normal soap after using and rinsing off the CHG Soap.  9. Pat yourself dry with a CLEAN TOWEL.   10. Wear CLEAN PAJAMAS   11. Place CLEAN SHEETS on your bed the night of your first shower and  DO NOT SLEEP WITH PETS.  Day of Surgery: Do not apply any deodorants/lotions. Please wear clean clothes to the hospital/surgery center.      Please read over the following fact sheets that you were given. Pain Booklet, Blood Transfusion Information and Surgical Site Infection Prevention

## 2017-02-27 ENCOUNTER — Encounter: Payer: Self-pay | Admitting: Surgery

## 2017-02-27 NOTE — Progress Notes (Signed)
PCP is [name], MD Referring Provider is Noralee Space, MD  Chief Complaint  Patient presents with  . Pleural Effusion    Surgical eval for PleurX placement    HPI:  The patient is a 76 year old gentleman with a history of hypertension, hyperlipidemia, COPD, remote smoking and CAD s/p CABG by me in 1996 with subsequent stenting of the distal LM and SVG to the diagonal a few years ago. 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 He was treated with concurrent chemoradiation. He has developed shortness of breath and cough with a recurrent right pleural effusion. He underwent thoracentesis on 06/22/2017 removing 1.2 L of serous fluid and a thoracentesis on 02/11/2017 removing only 280 cc of dark, bloody fluid. Cytology on both specimens was negative for malignancy. He has been treated with 2 courses of antibiotics by Dr. Lenna Gilford as well as a tapering dose of prednisone.  He is doing fairly well overall but still have some shortness of breath, right back pain and a cough productive of clear mucous.  Past Medical History:  Diagnosis Date  . AAA (abdominal aortic aneurysm) ([place]) 2010   4.4 cm 08/2008;4.44 in 7/10 and 4.65 in 08/2009; 4.8 by CT in 11/2009; 4.3 by ultrasound in 08/2010  . Arteriosclerotic cardiovascular disease (ASCVD) 1996   CABG-1996  . Arthritis    "fingers" (03/18/2014)  . CAD (coronary artery disease)    03/18/14:  PCI with DES to distal left main. 7/29: DES to the SVG to Diag  . Cancer ([place])    Upper right lobe lung cancer  . Cardiomyopathy, ischemic    Echo 03/17/14: EF 45-50%  . Chronic bronchitis ([place])   . Chronic rhinitis   . Colonic polyp 2002   polypectomy in 2002  . COPD (chronic obstructive pulmonary disease) ([place])   . Diverticulosis   . ED (erectile dysfunction)   . Encounter for antineoplastic chemotherapy 12/19/2015  . GERD (gastroesophageal reflux disease)   .  Hyperlipidemia   . Hypertension   . IFG (impaired fasting glucose)   . Myocardial infarction [facility])    "told h/o silent MI sometime before 1996"  . Pneumonia ~ 2001; ~ 2005  . Right bundle branch block   . Tobacco abuse, in remission    40 pack year total consumption; discontinued in 1996    Past Surgical History:  Procedure Laterality Date  . ABDOMINAL AORTIC ANEURYSM REPAIR  11/2012  . ABDOMINAL AORTIC ENDOVASCULAR STENT GRAFT N/A 12/11/2012   Procedure: ABDOMINAL AORTIC ENDOVASCULAR STENT GRAFT;  Surgeon: Mal Misty, MD;  Location: [place];  Service: Vascular;  Laterality: N/A;  Ultrasound guided; [place]  . CARDIAC CATHETERIZATION  01/08/1995  . COLONOSCOPY  2002   polypectomy-patient denies  . CORONARY ANGIOPLASTY WITH STENT PLACEMENT  03/18/2014   "1"  . CORONARY ANGIOPLASTY WITH STENT PLACEMENT  03/24/2014   "1"  . CORONARY ARTERY BYPASS GRAFT  01/09/1995   "CABG X3"  . JOINT REPLACEMENT    . LAPAROSCOPIC CHOLECYSTECTOMY  12/2009  . LEFT AND RIGHT HEART CATHETERIZATION WITH CORONARY/GRAFT ANGIOGRAM N/A 03/18/2014   Procedure: LEFT AND RIGHT HEART CATHETERIZATION WITH Beatrix Fetters;  Surgeon: Blane Ohara, MD;  Location[facility] CATH LAB;  Service: Cardiovascular;  Laterality: N/A;  . PERCUTANEOUS CORONARY STENT INTERVENTION (PCI-S)  03/18/2014   Procedure: PERCUTANEOUS CORONARY STENT INTERVENTION (PCI-S);  Surgeon: Blane Ohara, MD;  Location: [place] CATH LAB;  Service: Cardiovascular;;  . PERCUTANEOUS CORONARY STENT INTERVENTION (PCI-S) N/A 03/24/2014   Procedure: PERCUTANEOUS CORONARY STENT INTERVENTION (PCI-S);  Surgeon: Blane Ohara, MD;  [facility] C[facility] LAB;  Service: Cardiovascular;  Laterality: N/A;  . TOTAL HIP ARTHROPLASTY Left 01/21/2013   Procedure: TOTAL HIP ARTHROPLASTY ANTERIOR APPROACH;  Surgeon: Mauri Pole, MD;  Location: [place];  Service: Orthopedics;  Laterality: Left;  Marland Kitchen VIDEO BRONCHOSCOPY N/A 11/17/2015   Procedure: VIDEO BRONCHOSCOPY WITH FLUORO;   Surgeon: Rigoberto Noel, MD;  Location: [place];  Service: Cardiopulmonary;  Laterality: N/A;    Family History  Problem Relation Age of Onset  . Heart disease Father   . Cancer Father        Lung  . Arthritis Mother   . Parkinsonism Mother   . Arthritis Sister        Brother with rheumatoid arthritis  . Hypertension Brother   . Colon cancer Neg Hx   . Colon polyps Neg Hx     Social History Social History  Substance Use Topics  . Smoking status: Former Smoker    Packs/day: 1.50    Years: 30.00    Types: Cigarettes    Start date: 12/01/1956    Quit date: 01/08/1995  . Smokeless tobacco: Never Used  . Alcohol use 0.0 oz/week     Comment: 03/18/2014 "no alacohol since 1996"    Current Outpatient Prescriptions  Medication Sig Dispense Refill  . acetaminophen (TYLENOL) 500 MG tablet Take 500 mg by mouth every 6 (six) hours as needed for mild pain.    Marland Kitchen albuterol (PROVENTIL) (2.5 MG/3ML) 0.083% nebulizer solution Take 3 mLs (2.5 mg total) by nebulization 3 (three) times daily. 270 mL 0  . aspirin EC 81 MG tablet Take 81 mg by mouth daily.    . budesonide-formoterol (SYMBICORT) 160-4.5 MCG/ACT inhaler Inhale 2 puffs into the lungs 2 (two) times daily.    . clopidogrel (PLAVIX) 75 MG tablet TAKE ONE TABLET BY MOUTH DAILY. 90 tablet 2  . Ferrous Sulfate (IRON) 28 MG TABS Take 28 mg by mouth daily.    . furosemide (LASIX) 40 MG tablet Take 1 tablet (40 mg total) by mouth daily. 30 tablet 11  . Metoprolol Tartrate 75 MG TABS Take 1 tablet by mouth 2 (two) times daily. 180 tablet 1  . Multiple Vitamins-Minerals (CENTRUM SILVER ADULT 50+) TABS Take 1 tablet by mouth daily.     . nitroGLYCERIN (NITROSTAT) 0.4 MG SL tablet Place 1 tablet (0.4 mg total) under the tongue every 5 (five) minutes as needed for chest pain. 25 tablet 4  . omeprazole (PRILOSEC) 20 MG capsule TAKE ONE CAPSULE BY MOUTH DAILY. 90 capsule 1  . ondansetron (ZOFRAN-ODT) 4 MG disintegrating tablet Take 4 mg by mouth  every 8 (eight) hours as needed for nausea or vomiting.     . simvastatin (ZOCOR) 80 MG tablet Take 0.5 tablets (40 mg total) by mouth at bedtime. 15 tablet 5  . VENTOLIN HFA 108 (90 Base) MCG/ACT inhaler INHALE 2 PUFFS BY MOUTH EVERY 4 TO 6 HOURS AS NEEDED FOR WHEEZING. 18 g 0   No current facility-administered medications for this visit.     Allergies  Allergen Reactions  . Neomycin Hives    Review of Systems  Constitutional: Negative for activity change, appetite change, chills, fatigue, fever and unexpected weight change.  HENT: Negative.   Eyes: Negative.   Respiratory: Positive for cough and shortness of breath.  Cardiovascular: Negative for chest pain and leg swelling.  Gastrointestinal: Negative.   Endocrine: Negative.   Genitourinary: Negative.   Musculoskeletal: Positive for back pain.  Skin: Negative.   Allergic/Immunologic: Negative.   Neurological: Negative.   Hematological: Negative.   Psychiatric/Behavioral: Negative.     BP 120/63   Pulse 100   Resp 20   Ht _0  (1.778 m)   Wt 175 lb (79.4 kg)   SpO2 98% Comment: RA  BMI 25.11 kg/m  Physical Exam  Constitutional: He is oriented to person, place, and time. He appears well-developed and well-nourished. No distress.  HENT:  Head: Normocephalic and atraumatic.  Mouth/Throat: Oropharynx is clear and moist.  Eyes: Conjunctivae and EOM are normal. Pupils are equal, round, and reactive to light.  Neck: Normal range of motion. Neck supple. No JVD present. No thyromegaly present.  Cardiovascular: Normal rate, regular rhythm and normal heart sounds.   No murmur heard. Pulmonary/Chest: Effort normal. No respiratory distress. He has no wheezes. He has no rales.  Decreased breath sounds right lower lobe  Abdominal: Soft. Bowel sounds are normal. He exhibits no distension and no mass. There is no tenderness.  Musculoskeletal: Normal range of motion. He exhibits no edema.  Lymphadenopathy:    He has no cervical  adenopathy.  Neurological: He is alert and oriented to person, place, and time. He has normal strength. No cranial nerve deficit or sensory deficit.  Skin: Skin is warm and dry.  Psychiatric: He has a normal mood and affect.    Diagnostic Tests:  CT CHEST W CONTRAST (Accession [id number]) (Order [id number])  Imaging  Date: 02/13/2017 Department: [facility]-CT IMAGING Released By: Hilda Lias Authorizing: Curt Bears, MD  Exam Information   Status Exam Begun  Exam Ended   Final [99] 02/13/2017 8:50 AM 02/13/2017 9:01 AM  PACS Images   Show images for CT CHEST W CONTRAST  Study Result   CLINICAL DATA:  Followup lung cancer  EXAM: CT CHEST WITH CONTRAST  TECHNIQUE: Multidetector CT imaging of the chest was performed during intravenous contrast administration.  CONTRAST:  59m ISOVUE-300 IOPAMIDOL (ISOVUE-300) INJECTION 61%  COMPARISON:  10/30/2016  FINDINGS: Cardiovascular: The heart size appears within normal limits. There is no pericardial effusion identified. Previous median sternotomy and CABG procedure. Aortic atherosclerosis noted.  Mediastinum/Nodes: The trachea appears patent and is midline. Normal appearance of the esophagus. Index sub- carinal lymph node measures 1.2 cm, image 67 of series 2.  Lungs/Pleura: Moderate to large loculated right pleural effusion is again identified. The component overlying the right lower lobe now has multiple foci of gas which likely reflects recent instrumentation. Masslike architectural distortion within the anteromedial right upper lobe is unchanged when compared with the previous exam compatible with changes secondary to external beam radiation. There is a new area of masslike airspace consolidation in the right middle lobe which measures 3 cm, image 105 of series 5. Multiple faint nodular densities are scattered throughout the right lower lung as well. New scattered nodular densities within  the medial left lower lobe also identified.  Upper Abdomen: No acute abnormality.  Musculoskeletal: No chest wall abnormality. No acute or significant osseous findings.  IMPRESSION: 1. Since the previous exam there has been development of right middle lobe masslike consolidation and scattered nonspecific nodularity within both lower lobes. Inflammatory or infectious process. Aspiration not excluded. Short-term followup imaging following antibiotic therapy is advised to ensure resolution. 2. Similar appearance of loculated right pleural effusion 3. Changes of external  beam radiation within the medial right apex is similar to previous exam. 4. Stable borderline enlarged sub- carinal lymph node 5.  Aortic Atherosclerosis (ICD10-I70.0)   Electronically Signed   By: Kerby Moors M.D.   On: 02/13/2017 10:35      Impression:  He has a recurrent right pleural effusion with cough and back pain and shortness of breath following chemoradiation for Stage IIIA NSCLCa in 10/2015. The CT shows mass like consolidation in the RML and scattered nodularity in both lower lobes. Thoracentesis x 2 over the past 9 months has been negative for malignancy. There is an enhancing rim around the pleural effusion suggesting a loculated collection. I think the best treatment is bronchoscopy with biopsy and brushings of the RML followed by right VATS for drainage of the loculated effusion and examination of the pleural space followed by talc pleurodesis. This should allow [place] to diagnose malignancy if present and prevent recurrence of the right pleural effusion. I discussed the procedure with the patient in detail including alternatives, benefits and risk, including but not limited to bleeding, infection, injury to the lung, recurrent effusion and he understands and agrees to proceed.  Plan:  Flexible bronchoscopy, biopsy and brushings of the RML, right VATS for drainage of pleural effusion and talc  pleurodesis on Friday 03/01/2017  I spent 60 minutes performing this consultation and > 50% of this time was spent face to face counseling and coordinating the care of this patient's recurrent right pleural effusion.  Gaye Pollack, MD [facility] [phone]

## 2017-02-28 ENCOUNTER — Encounter (HOSPITAL_COMMUNITY)
Admission: RE | Admit: 2017-02-28 | Discharge: 2017-02-28 | Disposition: A | Discharge status: Home / Self Care | Payer: Medicare Other | Source: Ambulatory Visit | Attending: Surgery | Admitting: Surgery

## 2017-02-28 ENCOUNTER — Encounter (HOSPITAL_COMMUNITY): Payer: Self-pay

## 2017-02-28 ENCOUNTER — Other Ambulatory Visit: Payer: Self-pay | Admitting: Pulmonary Disease

## 2017-02-28 ENCOUNTER — Encounter (HOSPITAL_COMMUNITY): Payer: Self-pay | Admitting: Certified Registered Nurse Anesthetist

## 2017-02-28 ENCOUNTER — Ambulatory Visit (HOSPITAL_COMMUNITY)
Admission: RE | Admit: 2017-02-28 | Discharge: 2017-02-28 | Disposition: A | Discharge status: Home / Self Care | Payer: Medicare Other | Source: Ambulatory Visit | Attending: Surgery | Admitting: Surgery

## 2017-02-28 DIAGNOSIS — R0602 Shortness of breath: Secondary | ICD-10-CM

## 2017-02-28 DIAGNOSIS — J9811 Atelectasis: Secondary | ICD-10-CM

## 2017-02-28 DIAGNOSIS — Z951 Presence of aortocoronary bypass graft: Secondary | ICD-10-CM

## 2017-02-28 DIAGNOSIS — R918 Other nonspecific abnormal finding of lung field: Secondary | ICD-10-CM | POA: Insufficient documentation

## 2017-02-28 DIAGNOSIS — I2510 Atherosclerotic heart disease of native coronary artery without angina pectoris: Secondary | ICD-10-CM

## 2017-02-28 DIAGNOSIS — Z01818 Encounter for other preprocedural examination: Secondary | ICD-10-CM | POA: Insufficient documentation

## 2017-02-28 DIAGNOSIS — J90 Pleural effusion, not elsewhere classified: Secondary | ICD-10-CM

## 2017-02-28 DIAGNOSIS — I517 Cardiomegaly: Secondary | ICD-10-CM | POA: Insufficient documentation

## 2017-02-28 HISTORY — DX: Dyspnea, unspecified: R06.00

## 2017-02-28 HISTORY — DX: Anemia, unspecified: D64.9

## 2017-02-28 HISTORY — DX: Personal history of other medical treatment: Z92.89

## 2017-02-28 HISTORY — DX: Chronic kidney disease, unspecified: N18.9

## 2017-02-28 LAB — COMPREHENSIVE METABOLIC PANEL
ALK PHOS: 66 U/L (ref 38–126)
ALT: 17 U/L (ref 17–63)
AST: 28 U/L (ref 15–41)
Albumin: 3.3 g/dL — ABNORMAL LOW (ref 3.5–5.0)
Anion gap: 12 (ref 5–15)
BILIRUBIN TOTAL: 0.6 mg/dL (ref 0.3–1.2)
BUN: 13 mg/dL (ref 6–20)
CALCIUM: 8.8 mg/dL — AB (ref 8.9–10.3)
CO2: 21 mmol/L — ABNORMAL LOW (ref 22–32)
Chloride: 100 mmol/L — ABNORMAL LOW (ref 101–111)
Creatinine, Ser: 1.2 mg/dL (ref 0.61–1.24)
GFR calc Af Amer: >60 mL/min (ref >60)
GFR, EST NON AFRICAN AMERICAN: 57 mL/min — AB (ref >60)
Glucose, Bld: 104 mg/dL — ABNORMAL HIGH (ref 65–99)
POTASSIUM: 4.1 mmol/L (ref 3.5–5.1)
Sodium: 133 mmol/L — ABNORMAL LOW (ref 135–145)
TOTAL PROTEIN: 6.9 g/dL (ref 6.5–8.1)

## 2017-02-28 LAB — BLOOD GAS, ARTERIAL
Acid-Base Excess: 3.1 mmol/L — ABNORMAL HIGH (ref 0.0–2.0)
BICARBONATE: 26.5 mmol/L (ref 20.0–28.0)
Drawn by: 470591
O2 SAT: 97.1 %
PATIENT TEMPERATURE: 98.6
PCO2 ART: 35.9 mmHg (ref 32.0–48.0)
PO2 ART: 87.1 mmHg (ref 83.0–108.0)
pH, Arterial: 7.48 — ABNORMAL HIGH (ref 7.350–7.450)

## 2017-02-28 LAB — CBC
HEMATOCRIT: 36.5 % — AB (ref 39.0–52.0)
HEMOGLOBIN: 11 g/dL — AB (ref 13.0–17.0)
MCH: 26.1 pg (ref 26.0–34.0)
MCHC: 30.1 g/dL (ref 30.0–36.0)
MCV: 86.5 fL (ref 78.0–100.0)
Platelets: 163 10*3/uL (ref 150–400)
RBC: 4.22 MIL/uL (ref 4.22–5.81)
RDW: 17.9 % — AB (ref 11.5–15.5)
WBC: 9 10*3/uL (ref 4.0–10.5)

## 2017-02-28 LAB — URINALYSIS, ROUTINE W REFLEX MICROSCOPIC
BILIRUBIN URINE: NEGATIVE
Glucose, UA: NEGATIVE mg/dL
Hgb urine dipstick: NEGATIVE
Ketones, ur: NEGATIVE mg/dL
LEUKOCYTES UA: NEGATIVE
NITRITE: NEGATIVE
PH: 6 (ref 5.0–8.0)
Protein, ur: NEGATIVE mg/dL
SPECIFIC GRAVITY, URINE: 1.006 (ref 1.005–1.030)

## 2017-02-28 LAB — SURGICAL PCR SCREEN
MRSA, PCR: NEGATIVE
STAPHYLOCOCCUS AUREUS: NEGATIVE

## 2017-02-28 LAB — TYPE AND SCREEN
ABO/RH(D): O POS
ANTIBODY SCREEN: NEGATIVE

## 2017-02-28 LAB — APTT: APTT: 30 s (ref 24–36)

## 2017-02-28 LAB — PROTIME-INR
INR: 1.04
PROTHROMBIN TIME: 13.6 s (ref 11.4–15.2)

## 2017-02-28 NOTE — Progress Notes (Addendum)
Mr Paul Sandoval denies  Pain, right chest a little sore from coughing.  Patients cardiologist is Dr Johnsie Cancel, PCP is Dr Geri Seminole. Mr Paul Sandoval has CKD- does not see a nephrologist.

## 2017-02-28 NOTE — Progress Notes (Signed)
Anesthesia Chart Review:  Pt is a 76 year old male scheduled for flexible bronchoscopy, VATS, drainage of pleural effusion on 03/01/2017 with Carlene Coria, M.D.  - PCP is Baltazar Apo, MD - Pulmonologist is Teressa Lower, MD - Oncologist is Curt Bears, MD  - Cardiologist is Jenkins Rouge, MD. Last office visit 12/21/16. Note states pt had drop in EF last summer to 30-35%; medical management was elected due to pt's lack of symptoms and advanced lung cancer.  As pt has progressed through cancer treatment, prognosis has improved apparently, and cath will be considered in about 6 months.   PMH includes: CAD (s/p CABG 1996; DES to LM, DES to SVG-diag 2015), CHF, ischemic cardiomyopathy (EF 20-25% by 02/26/17 echo), AAA (S/p EVAR 12/11/12), HTN, hyperlipidemia, impaired fasting glucose, anemia, CKD, COPD, lung cancer, GERD. Former smoker. BMI 25. S/p L THA 01/21/13.   Medications include: Albuterol, ASA 81 mg, Symbicort, Plavix, iron, Lasix, metoprolol, Prilosec, simvastatin. Last dose plavix 02/25/17.   Preoperative labs reviewed.    CXR 02/28/17:  1. Stable upper lobe opacification with pleural thickening consistent with history of prior radiation therapy . 2. Persistent right base atelectasis and right side pleural effusion. 3. Prior CABG.  Stable cardiomegaly.  EKG 02/18/17: Sinus rhythm. RBBB and LPFB. Probable inferior infarct, age indeterminate  Echo 02/26/17:  - Left ventricle: The cavity size was moderately dilated. Wall thickness was normal. Systolic function was severely reduced. The estimated ejection fraction was in the range of 20% to 25%. Doppler parameters are consistent with abnormal left ventricular relaxation (grade 1 diastolic dysfunction). - Aortic valve: Moderately calcified annulus. Moderately calcified leaflets. - Right ventricle: The cavity size was moderately dilated. Systolic function was mildly reduced. - Right atrium: The atrium was mildly dilated.  Nuclear stress test  03/06/16:   Nuclear stress EF: 30%. The LV function is markedly depressed  There was no ST segment deviation noted during stress.  Defect 1: There is a large defect of severe severity present in the basal anteroseptal, mid anterior, mid anteroseptal, apical anterior and apex location.  Findings consistent with prior anterior apical myocardial infarction. There is no evidence of reversible ischemia  This is a high risk study.  Cardiac cath 03/18/14:  1. Severe three-vessel coronary artery disease with total occlusion of the RCA, total occlusion of the LAD, severe ulcerated stenosis of the distal left main, and severe diffuse stenosis of the OM left circumflex branches 2. Status post CABG with continued patency of the saphenous vein graft to PDA, LIMA to LAD, and patent but severely diseased saphenous vein graft to diagonal. DES to SVG-diag 03/24/14.  3. Successful PCI of the distal left main with a drug-eluting stent 03/18/14 4. Known mild LV dysfunction by echo, LVEF 45%  If no changes, I anticipate pt can proceed with surgery as scheduled.   Willeen Cass, FNP-BC [facility]/Anesthesiology Phone: [phone] 02/28/2017 2:15 PM

## 2017-02-28 NOTE — Anesthesia Preprocedure Evaluation (Addendum)
Anesthesia Evaluation  Patient identified by MRN, date of birth, ID band Patient awake    Reviewed: Allergy & Precautions, NPO status , Patient's Chart, lab work & pertinent test results, reviewed documented beta blocker date and time   Airway Mallampati: II  TM Distance: >3 FB Neck ROM: Full    Dental no notable dental hx. (+) Teeth Intact   Pulmonary shortness of breath, asthma , pneumonia, resolved, COPD,  COPD inhaler, former smoker,  Right pleural effusion Hx/o RUL lung Ca    Pulmonary exam normal breath sounds clear to auscultation + decreased breath sounds      Cardiovascular hypertension, Pt. on medications and Pt. on home beta blockers + angina with exertion + CAD, + Past MI, + Peripheral Vascular Disease and +CHF  Normal cardiovascular exam+ dysrhythmias  Rhythm:Regular Rate:Normal  Echo 02/26/17-- Left ventricle: The cavity size was moderately dilated. Wall  thickness was normal. Systolic function was severely reduced. The  estimated ejection fraction was in the range of 20% to 25%.   Doppler parameters are consistent with abnormal left ventricular  relaxation (grade 1 diastolic dysfunction). - Aortic valve: Moderately calcified annulus. Moderately calcified leaflets. - Right ventricle: The cavity size was moderately dilated. Systolic  function was mildly reduced. - Right atrium: The atrium was mildly dilated.  EKG- RBBB+ LPFB, inferior infarct   Neuro/Psych negative neurological ROS  negative psych ROS   GI/Hepatic Neg liver ROS, GERD  Medicated and Controlled,  Endo/Other  Hyperlipidemia   Renal/GU Renal InsufficiencyRenal disease  negative genitourinary   Musculoskeletal  (+) Arthritis , Osteoarthritis,    Abdominal   Peds  Hematology  (+) anemia , Thrombocytopenia    Anesthesia Other Findings   Reproductive/Obstetrics                           Anesthesia Physical Anesthesia  Plan  ASA: III  Anesthesia Plan: General   Post-op Pain Management:    Induction: Intravenous  PONV Risk Score and Plan: 3 and Ondansetron, Dexamethasone, Propofol, Treatment may vary due to age or medical condition and Promethazine  Airway Management Planned: Double Lumen EBT  Additional Equipment: Arterial line  Intra-op Plan:   Post-operative Plan: Extubation in OR  Informed Consent:   Plan Discussed with:   Anesthesia Plan Comments:         Anesthesia Quick Evaluation

## 2017-02-28 NOTE — H&P (Signed)
[address]       [place] [address]             [phone]      Cardiothoracic Surgery Admission History and Physical   PCP is Luking, Grace Bushy, MD Referring Provider is Noralee Space, MD      Chief Complaint  Patient presents with  . Pleural Effusion        HPI:  The patient is a 76 year old gentleman with a history of hypertension, hyperlipidemia, COPD, remote smoking and CAD s/p CABG by me in 1996 with subsequent stenting of the distal LM and SVG to the diagonal a few years ago. 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 He was treated with concurrent chemoradiation. He has developed shortness of breath and cough with a recurrent right pleural effusion. He underwent thoracentesis on 06/22/2017 removing 1.2 L of serous fluid and a thoracentesis on 02/11/2017 removing only 280 cc of dark, bloody fluid. Cytology on both specimens was negative for malignancy. He has been treated with 2 courses of antibiotics by Dr. Lenna Gilford as well as a tapering dose of prednisone.  He is doing fairly well overall but still have some shortness of breath, right back pain and a cough productive of clear mucous.      Past Medical History:  Diagnosis Date  . AAA (abdominal aortic aneurysm) ([place]) 2010   4.4 cm 08/2008;4.44 in 7/10 and 4.65 in 08/2009; 4.8 by CT in 11/2009; 4.3 by ultrasound in 08/2010  . Arteriosclerotic cardiovascular disease (ASCVD) 1996   CABG-1996  . Arthritis    "fingers" (03/18/2014)  . CAD (coronary artery disease)    03/18/14:  PCI with DES to distal left main. 7/29: DES to the SVG to Diag  . Cancer ([place])    Upper right lobe lung cancer  . Cardiomyopathy, ischemic    Echo 03/17/14: EF 45-50%  . Chronic bronchitis ([place])   . Chronic rhinitis   . Colonic polyp 2002   polypectomy in 2002  . COPD (chronic obstructive pulmonary disease) ([place])   .  Diverticulosis   . ED (erectile dysfunction)   . Encounter for antineoplastic chemotherapy 12/19/2015  . GERD (gastroesophageal reflux disease)   . Hyperlipidemia   . Hypertension   . IFG (impaired fasting glucose)   . Myocardial infarction [facility])    "told h/o silent MI sometime before 1996"  . Pneumonia ~ 2001; ~ 2005  . Right bundle branch block   . Tobacco abuse, in remission    40 pack year total consumption; discontinued in 1996         Past Surgical History:  Procedure Laterality Date  . ABDOMINAL AORTIC ANEURYSM REPAIR  11/2012  . ABDOMINAL AORTIC ENDOVASCULAR STENT GRAFT N/A 12/11/2012   Procedure: ABDOMINAL AORTIC ENDOVASCULAR STENT GRAFT;  Surgeon: Mal Misty, MD;  Location: [facility];  Service: Vascular;  Laterality: N/A;  Ultrasound guided; [place]  . CARDIAC CATHETERIZATION  01/08/1995  . COLONOSCOPY  2002   polypectomy-patient denies  . CORONARY ANGIOPLASTY WITH STENT PLACEMENT  03/18/2014   "1"  . CORONARY ANGIOPLASTY WITH STENT PLACEMENT  03/24/2014   "1"  . CORONARY ARTERY BYPASS GRAFT  01/09/1995   "CABG X3"  . JOINT REPLACEMENT    . LAPAROSCOPIC CHOLECYSTECTOMY  12/2009  . LEFT AND RIGHT HEART CATHETERIZATION WITH CORONARY/GRAFT ANGIOGRAM N/A 03/18/2014   Procedure:  LEFT AND RIGHT HEART CATHETERIZATION WITH Beatrix Fetters;  Surgeon: Blane Ohara, MD;  Location: [facility] CATH LAB;  Service: Cardiovascular;  Laterality: N/A;  . PERCUTANEOUS CORONARY STENT INTERVENTION (PCI-S)  03/18/2014   Procedure: PERCUTANEOUS CORONARY STENT INTERVENTION (PCI-S);  Surgeon: Blane Ohara, MD;  Location: [facility] CATH LAB;  Service: Cardiovascular;;  . PERCUTANEOUS CORONARY STENT INTERVENTION (PCI-S) N/A 03/24/2014   Procedure: PERCUTANEOUS CORONARY STENT INTERVENTION (PCI-S);  Surgeon: Blane Ohara, MD;  Location: [facility] CATH LAB;  Service: Cardiovascular;  Laterality: N/A;  . TOTAL HIP ARTHROPLASTY Left 01/21/2013   Procedure: TOTAL HIP ARTHROPLASTY  ANTERIOR APPROACH;  Surgeon: Mauri Pole, MD;  Location: [place];  Service: Orthopedics;  Laterality: Left;  Marland Kitchen VIDEO BRONCHOSCOPY N/A 11/17/2015   Procedure: VIDEO BRONCHOSCOPY WITH FLUORO;  Surgeon: Rigoberto Noel, MD;  Location: [place];  Service: Cardiopulmonary;  Laterality: N/A;         Family History  Problem Relation Age of Onset  . Heart disease Father   . Cancer Father        Lung  . Arthritis Mother   . Parkinsonism Mother   . Arthritis Sister        Brother with rheumatoid arthritis  . Hypertension Brother   . Colon cancer Neg Hx   . Colon polyps Neg Hx     Social History        Social History  Substance Use Topics  . Smoking status: Former Smoker    Packs/day: 1.50    Years: 30.00    Types: Cigarettes    Start date: 12/01/1956    Quit date: 01/08/1995  . Smokeless tobacco: Never Used  . Alcohol use 0.0 oz/week      Comment: 03/18/2014 "no alacohol since 1996"          Current Outpatient Prescriptions  Medication Sig Dispense Refill  . acetaminophen (TYLENOL) 500 MG tablet Take 500 mg by mouth every 6 (six) hours as needed for mild pain.    [place] Kitchen albuterol (PROVENTIL) (2.5 MG/3ML) 0.083% nebulizer solution Take 3 mLs (2.5 mg total) by nebulization 3 (three) times daily. 270 mL 0  . aspirin EC 81 MG tablet Take 81 mg by mouth daily.    . budesonide-formoterol (SYMBICORT) 160-4.5 MCG/ACT inhaler Inhale 2 puffs into the lungs 2 (two) times daily.    . clopidogrel (PLAVIX) 75 MG tablet TAKE ONE TABLET BY MOUTH DAILY. 90 tablet 2  . Ferrous Sulfate (IRON) 28 MG TABS Take 28 mg by mouth daily.    . furosemide (LASIX) 40 MG tablet Take 1 tablet (40 mg total) by mouth daily. 30 tablet 11  . Metoprolol Tartrate 75 MG TABS Take 1 tablet by mouth 2 (two) times daily. 180 tablet 1  . Multiple Vitamins-Minerals (CENTRUM SILVER ADULT 50+) TABS Take 1 tablet by mouth daily.     . nitroGLYCERIN (NITROSTAT) 0.4 MG SL tablet Place 1 tablet  (0.4 mg total) under the tongue every 5 (five) minutes as needed for chest pain. 25 tablet 4  . omeprazole (PRILOSEC) 20 MG capsule TAKE ONE CAPSULE BY MOUTH DAILY. 90 capsule 1  . ondansetron (ZOFRAN-ODT) 4 MG disintegrating tablet Take 4 mg by mouth every 8 (eight) hours as needed for nausea or vomiting.     . simvastatin (ZOCOR) 80 MG tablet Take 0.5 tablets (40 mg total) by mouth at bedtime. 15 tablet 5  . VENTOLIN HFA 108 (90 Base) MCG/ACT inhaler INHALE 2 PUFFS BY MOUTH EVERY 4 TO 6 HOURS  AS NEEDED FOR WHEEZING. 18 g 0   No current facility-administered medications for this visit.         Allergies  Allergen Reactions  . Neomycin Hives    Review of Systems  Constitutional: Negative for activity change, appetite change, chills, fatigue, fever and unexpected weight change.  HENT: Negative.   Eyes: Negative.   Respiratory: Positive for cough and shortness of breath.   Cardiovascular: Negative for chest pain and leg swelling.  Gastrointestinal: Negative.   Endocrine: Negative.   Genitourinary: Negative.   Musculoskeletal: Positive for back pain.  Skin: Negative.   Allergic/Immunologic: Negative.   Neurological: Negative.   Hematological: Negative.   Psychiatric/Behavioral: Negative.     BP 120/63   Pulse 100   Resp 20   Ht _0  (1.778 m)   Wt 175 lb (79.4 kg)   SpO2 98% Comment: RA  BMI 25.11 kg/m  Physical Exam  Constitutional: He is oriented to person, place, and time. He appears well-developed and well-nourished. No distress.  HENT:  Head: Normocephalic and atraumatic.  Mouth/Throat: Oropharynx is clear and moist.  Eyes: Conjunctivae and EOM are normal. Pupils are equal, round, and reactive to light.  Neck: Normal range of motion. Neck supple. No JVD present. No thyromegaly present.  Cardiovascular: Normal rate, regular rhythm and normal heart sounds.   No murmur heard. Pulmonary/Chest: Effort normal. No respiratory distress. He has no wheezes. He has  no rales.  Decreased breath sounds right lower lobe  Abdominal: Soft. Bowel sounds are normal. He exhibits no distension and no mass. There is no tenderness.  Musculoskeletal: Normal range of motion. He exhibits no edema.  Lymphadenopathy:    He has no cervical adenopathy.  Neurological: He is alert and oriented to person, place, and time. He has normal strength. No cranial nerve deficit or sensory deficit.  Skin: Skin is warm and dry.  Psychiatric: He has a normal mood and affect.    Diagnostic Tests:  CT CHEST W CONTRAST (Accession [id number]) (Order [id number])  Imaging  Date: 02/13/2017 Department: [facility]-CT IMAGING Released By: Hilda Lias Authorizing: Curt Bears, MD  Exam Information   Status Exam Begun  Exam Ended   Final [99] 02/13/2017 8:50 AM 02/13/2017 9:01 AM  PACS Images   Show images for CT CHEST W CONTRAST  Study Result   CLINICAL DATA: Followup lung cancer  EXAM: CT CHEST WITH CONTRAST  TECHNIQUE: Multidetector CT imaging of the chest was performed during intravenous contrast administration.  CONTRAST: 57m ISOVUE-300 IOPAMIDOL (ISOVUE-300) INJECTION 61%  COMPARISON: 10/30/2016  FINDINGS: Cardiovascular: The heart size appears within normal limits. There is no pericardial effusion identified. Previous median sternotomy and CABG procedure. Aortic atherosclerosis noted.  Mediastinum/Nodes: The trachea appears patent and is midline. Normal appearance of the esophagus. Index sub- carinal lymph node measures 1.2 cm, image 67 of series 2.  Lungs/Pleura: Moderate to large loculated right pleural effusion is again identified. The component overlying the right lower lobe now has multiple foci of gas which likely reflects recent instrumentation. Masslike architectural distortion within the anteromedial right upper lobe is unchanged when compared with the previous exam compatible with changes secondary to  external beam radiation. There is a new area of masslike airspace consolidation in the right middle lobe which measures 3 cm, image 105 of series 5. Multiple faint nodular densities are scattered throughout the right lower lung as well. New scattered nodular densities within the medial left lower lobe also identified.  Upper Abdomen: No  acute abnormality.  Musculoskeletal: No chest wall abnormality. No acute or significant osseous findings.  IMPRESSION: 1. Since the previous exam there has been development of right middle lobe masslike consolidation and scattered nonspecific nodularity within both lower lobes. Inflammatory or infectious process. Aspiration not excluded. Short-term followup imaging following antibiotic therapy is advised to ensure resolution. 2. Similar appearance of loculated right pleural effusion 3. Changes of external beam radiation within the medial right apex is similar to previous exam. 4. Stable borderline enlarged sub- carinal lymph node 5. Aortic Atherosclerosis (ICD10-I70.0)   Electronically Signed By: Kerby Moors M.D. On: 02/13/2017 10:35      Impression:  He has a recurrent right pleural effusion with cough and back pain and shortness of breath following chemoradiation for Stage IIIA NSCLCa in 10/2015. The CT shows mass like consolidation in the RML and scattered nodularity in both lower lobes. Thoracentesis x 2 over the past 9 months has been negative for malignancy. There is an enhancing rim around the pleural effusion suggesting a loculated collection. I think the best treatment is bronchoscopy with biopsy and brushings of the RML followed by right VATS for drainage of the loculated effusion and examination of the pleural space followed by talc pleurodesis. This should allow [place] to diagnose malignancy if present and prevent recurrence of the right pleural effusion. I discussed the procedure with the patient in detail including  alternatives, benefits and risk, including but not limited to bleeding, infection, injury to the lung, recurrent effusion and he understands and agrees to proceed.  Plan:  Flexible bronchoscopy, biopsy and brushings of the RML, right VATS for drainage of pleural effusion and talc pleurodesis on Friday 03/01/2017   Gaye Pollack, MD [facility]

## 2017-02-28 NOTE — Pre-Procedure Instructions (Addendum)
Paul Sandoval  02/28/2017     Your procedure is scheduled on March 01, 2017.  Report to [facility] Admitting at 5:30 A.M.                 Your surgery or procedure is scheduled for 7:30 AM   Call this number if you have problems the morning of surgery:[phone]   Remember:  Do not eat food or drink liquids after midnight on February 28, 2017  Take these medicines the morning of surgery with A SIP OF WATER :             Metoprolol Tartrate, and Omeprazole (PRILOSEC).                Use Symbicort Inhaler    If needed : Acetaminophen (TYLENOL), NitroGLYCERIN (NITROSTAT), Ondansetron (ZOFRAN-ODT),  Albuterol (PROVENTIL) nebulizer, and VENTOLIN HFA inhaler, bring inhaler with you the day of surgery.  Stop taking NOW all Aspirin, Vitamins, Fish oils, and Herbal medicaitons. Also stop all NSAIDS i.e. Advil, Motrin, Aleve, Anaprox, Naproxen, BC and Goody Powders.              Plavix as instructed by Dr. Cyndia Bent   Do not wear jewelry, make-up or nail polish.  Do not wear lotions, powders, or perfumes, or deodorant.  Do not shave 48 hours prior to surgery.  Men may shave face and neck.  Do not bring valuables to the hospital.  [facility] is not responsible for any belongings or valuables.  Contacts, dentures or bridgework may not be worn into surgery.  Leave your suitcase in the car.  After surgery it may be brought to your room.  For patients admitted to the hospital, discharge time will be determined by your treatment team.   Special instructions:    [facility]- Preparing For Surgery  Before surgery, you can play an important role. Because skin is not sterile, your skin needs to be as free of germs as possible. You can reduce the number of germs on your skin by washing with CHG (chlorahexidine gluconate) Soap before surgery.  CHG is an antiseptic cleaner which kills germs and bonds with the skin to continue killing germs even after washing.  Please do not use if you have  an allergy to CHG or antibacterial soaps. If your skin becomes reddened/irritated stop using the CHG.  Do not shave (including legs and underarms) for at least 48 hours prior to first CHG shower. It is OK to shave your face.  Please follow these instructions carefully.   1. Shower the NIGHT BEFORE SURGERY and the MORNING OF SURGERY with CHG.   2. If you chose to wash your hair, wash your hair first as usual with your normal shampoo.  3. After you shampoo, rinse your hair and body thoroughly to remove the shampoo.  4. Use CHG as you would any other liquid soap. You can apply CHG directly to the skin and wash gently with a scrungie or a clean washcloth.   5. Apply the CHG Soap to your body ONLY FROM THE NECK DOWN.  Do not use on open wounds or open sores. Avoid contact with your eyes, ears, mouth and genitals (private parts). Wash genitals (private parts) with your normal soap.  6. Wash thoroughly, paying special attention to the area where your surgery will be performed.  7. Thoroughly rinse your body with warm water from the neck down.  8. DO NOT shower/wash with your normal soap  after using and rinsing off the CHG Soap.  9. Pat yourself dry with a CLEAN TOWEL.   10. Wear CLEAN PAJAMAS   11. Place CLEAN SHEETS on your bed the night of your first shower and DO NOT SLEEP WITH PETS.  Day of Surgery: Do not apply any deodorants/lotions. Please wear clean clothes to the hospital/surgery center.      Please read over the following fact sheets that you were given. Pain Booklet, Blood Transfusion Information and Surgical Site Infection Prevention

## 2017-03-01 ENCOUNTER — Encounter (HOSPITAL_COMMUNITY)
Admission: RE | Disposition: A | Discharge status: Home / Self Care | Payer: Self-pay | Source: Ambulatory Visit | Attending: Surgery

## 2017-03-01 ENCOUNTER — Inpatient Hospital Stay (HOSPITAL_COMMUNITY): Payer: Medicare Other | Admitting: Certified Registered Nurse Anesthetist

## 2017-03-01 ENCOUNTER — Inpatient Hospital Stay (HOSPITAL_COMMUNITY)
Admission: RE | Admit: 2017-03-01 | Discharge: 2017-03-08 | DRG: 167 | Disposition: A | Discharge status: Home / Self Care | Payer: Medicare Other | Source: Ambulatory Visit | Attending: Surgery | Admitting: Surgery

## 2017-03-01 ENCOUNTER — Inpatient Hospital Stay (HOSPITAL_COMMUNITY): Payer: Medicare Other

## 2017-03-01 ENCOUNTER — Inpatient Hospital Stay (HOSPITAL_COMMUNITY): Payer: Medicare Other | Admitting: Emergency Medicine

## 2017-03-01 ENCOUNTER — Encounter (HOSPITAL_COMMUNITY): Payer: Self-pay | Admitting: *Deleted

## 2017-03-01 DIAGNOSIS — I5043 Acute on chronic combined systolic (congestive) and diastolic (congestive) heart failure: Secondary | ICD-10-CM | POA: Diagnosis not present

## 2017-03-01 DIAGNOSIS — J9382 Other air leak: Secondary | ICD-10-CM | POA: Diagnosis not present

## 2017-03-01 DIAGNOSIS — I2510 Atherosclerotic heart disease of native coronary artery without angina pectoris: Secondary | ICD-10-CM | POA: Diagnosis not present

## 2017-03-01 DIAGNOSIS — Z85118 Personal history of other malignant neoplasm of bronchus and lung: Secondary | ICD-10-CM

## 2017-03-01 DIAGNOSIS — Z955 Presence of coronary angioplasty implant and graft: Secondary | ICD-10-CM

## 2017-03-01 DIAGNOSIS — I4949 Other premature depolarization: Secondary | ICD-10-CM | POA: Diagnosis present

## 2017-03-01 DIAGNOSIS — I4510 Unspecified right bundle-branch block: Secondary | ICD-10-CM | POA: Diagnosis present

## 2017-03-01 DIAGNOSIS — I130 Hypertensive heart and chronic kidney disease with heart failure and stage 1 through stage 4 chronic kidney disease, or unspecified chronic kidney disease: Secondary | ICD-10-CM | POA: Diagnosis not present

## 2017-03-01 DIAGNOSIS — Z7951 Long term (current) use of inhaled steroids: Secondary | ICD-10-CM

## 2017-03-01 DIAGNOSIS — I739 Peripheral vascular disease, unspecified: Secondary | ICD-10-CM | POA: Diagnosis present

## 2017-03-01 DIAGNOSIS — Z923 Personal history of irradiation: Secondary | ICD-10-CM | POA: Diagnosis not present

## 2017-03-01 DIAGNOSIS — Z96642 Presence of left artificial hip joint: Secondary | ICD-10-CM | POA: Diagnosis not present

## 2017-03-01 DIAGNOSIS — T85698A Other mechanical complication of other specified internal prosthetic devices, implants and grafts, initial encounter: Secondary | ICD-10-CM | POA: Diagnosis not present

## 2017-03-01 DIAGNOSIS — J9811 Atelectasis: Secondary | ICD-10-CM | POA: Diagnosis present

## 2017-03-01 DIAGNOSIS — Z951 Presence of aortocoronary bypass graft: Secondary | ICD-10-CM

## 2017-03-01 DIAGNOSIS — R918 Other nonspecific abnormal finding of lung field: Secondary | ICD-10-CM | POA: Diagnosis present

## 2017-03-01 DIAGNOSIS — Z87891 Personal history of nicotine dependence: Secondary | ICD-10-CM

## 2017-03-01 DIAGNOSIS — J90 Pleural effusion, not elsewhere classified: Secondary | ICD-10-CM | POA: Diagnosis not present

## 2017-03-01 DIAGNOSIS — I714 Abdominal aortic aneurysm, without rupture: Secondary | ICD-10-CM | POA: Diagnosis not present

## 2017-03-01 DIAGNOSIS — I5042 Chronic combined systolic (congestive) and diastolic (congestive) heart failure: Secondary | ICD-10-CM | POA: Diagnosis not present

## 2017-03-01 DIAGNOSIS — I069 Rheumatic aortic valve disease, unspecified: Secondary | ICD-10-CM | POA: Diagnosis present

## 2017-03-01 DIAGNOSIS — Z9221 Personal history of antineoplastic chemotherapy: Secondary | ICD-10-CM

## 2017-03-01 DIAGNOSIS — K219 Gastro-esophageal reflux disease without esophagitis: Secondary | ICD-10-CM | POA: Diagnosis not present

## 2017-03-01 DIAGNOSIS — I255 Ischemic cardiomyopathy: Secondary | ICD-10-CM | POA: Diagnosis present

## 2017-03-01 DIAGNOSIS — M549 Dorsalgia, unspecified: Secondary | ICD-10-CM | POA: Diagnosis not present

## 2017-03-01 DIAGNOSIS — I252 Old myocardial infarction: Secondary | ICD-10-CM

## 2017-03-01 DIAGNOSIS — Z7982 Long term (current) use of aspirin: Secondary | ICD-10-CM

## 2017-03-01 DIAGNOSIS — J449 Chronic obstructive pulmonary disease, unspecified: Secondary | ICD-10-CM | POA: Diagnosis present

## 2017-03-01 DIAGNOSIS — Z9689 Presence of other specified functional implants: Secondary | ICD-10-CM

## 2017-03-01 DIAGNOSIS — N183 Chronic kidney disease, stage 3 (moderate): Secondary | ICD-10-CM | POA: Diagnosis present

## 2017-03-01 DIAGNOSIS — Y828 Other medical devices associated with adverse incidents: Secondary | ICD-10-CM | POA: Diagnosis not present

## 2017-03-01 DIAGNOSIS — I420 Dilated cardiomyopathy: Secondary | ICD-10-CM | POA: Diagnosis present

## 2017-03-01 DIAGNOSIS — Z7902 Long term (current) use of antithrombotics/antiplatelets: Secondary | ICD-10-CM

## 2017-03-01 DIAGNOSIS — J984 Other disorders of lung: Secondary | ICD-10-CM | POA: Diagnosis not present

## 2017-03-01 DIAGNOSIS — Z79899 Other long term (current) drug therapy: Secondary | ICD-10-CM

## 2017-03-01 DIAGNOSIS — Z883 Allergy status to other anti-infective agents status: Secondary | ICD-10-CM

## 2017-03-01 DIAGNOSIS — M19049 Primary osteoarthritis, unspecified hand: Secondary | ICD-10-CM | POA: Diagnosis present

## 2017-03-01 DIAGNOSIS — E785 Hyperlipidemia, unspecified: Secondary | ICD-10-CM | POA: Diagnosis not present

## 2017-03-01 DIAGNOSIS — D696 Thrombocytopenia, unspecified: Secondary | ICD-10-CM | POA: Diagnosis not present

## 2017-03-01 DIAGNOSIS — B9689 Other specified bacterial agents as the cause of diseases classified elsewhere: Secondary | ICD-10-CM | POA: Diagnosis present

## 2017-03-01 DIAGNOSIS — Y92230 Patient room in hospital as the place of occurrence of the external cause: Secondary | ICD-10-CM | POA: Diagnosis not present

## 2017-03-01 DIAGNOSIS — R0602 Shortness of breath: Secondary | ICD-10-CM | POA: Diagnosis not present

## 2017-03-01 DIAGNOSIS — Z4682 Encounter for fitting and adjustment of non-vascular catheter: Secondary | ICD-10-CM | POA: Diagnosis not present

## 2017-03-01 DIAGNOSIS — J939 Pneumothorax, unspecified: Secondary | ICD-10-CM | POA: Diagnosis not present

## 2017-03-01 DIAGNOSIS — Z09 Encounter for follow-up examination after completed treatment for conditions other than malignant neoplasm: Secondary | ICD-10-CM

## 2017-03-01 HISTORY — PX: VIDEO ASSISTED THORACOSCOPY: SHX5073

## 2017-03-01 HISTORY — PX: PLEURAL EFFUSION DRAINAGE: SHX5099

## 2017-03-01 HISTORY — PX: FLEXIBLE BRONCHOSCOPY: SHX5094

## 2017-03-01 HISTORY — PX: TALC PLEURODESIS: SHX2506

## 2017-03-01 SURGERY — BRONCHOSCOPY, FLEXIBLE
Anesthesia: General | Laterality: Right

## 2017-03-01 MED ORDER — METOCLOPRAMIDE HCL 5 MG/ML IJ SOLN
10.0000 mg | Freq: Four times a day (QID) | INTRAMUSCULAR | Status: AC
  Administered 2017-03-01 – 2017-03-02 (×4): 10 mg via INTRAVENOUS
  Filled 2017-03-01 (×3): qty 2

## 2017-03-01 MED ORDER — KCL IN DEXTROSE-NACL 20-5-0.9 MEQ/L-%-% IV SOLN
INTRAVENOUS | Status: DC
  Administered 2017-03-01: 17:00:00 via INTRAVENOUS
  Filled 2017-03-01 (×3): qty 1000

## 2017-03-01 MED ORDER — FENTANYL CITRATE (PF) 100 MCG/2ML IJ SOLN
INTRAMUSCULAR | Status: DC | PRN
  Administered 2017-03-01 (×2): 50 ug via INTRAVENOUS
  Administered 2017-03-01: 100 ug via INTRAVENOUS
  Administered 2017-03-01 (×4): 50 ug via INTRAVENOUS

## 2017-03-01 MED ORDER — FENTANYL CITRATE (PF) 250 MCG/5ML IJ SOLN
INTRAMUSCULAR | Status: AC
  Filled 2017-03-01: qty 5

## 2017-03-01 MED ORDER — MEPERIDINE HCL 25 MG/ML IJ SOLN: 6.2500 mg | INTRAMUSCULAR | Status: DC | PRN

## 2017-03-01 MED ORDER — METOPROLOL TARTRATE 75 MG PO TABS: 1.0000 | ORAL_TABLET | Freq: Two times a day (BID) | ORAL | Status: DC

## 2017-03-01 MED ORDER — ETOMIDATE 2 MG/ML IV SOLN
INTRAVENOUS | Status: DC | PRN
  Administered 2017-03-01: 18 mg via INTRAVENOUS

## 2017-03-01 MED ORDER — OXYCODONE HCL 5 MG PO TABS
5.0000 mg | ORAL_TABLET | ORAL | Status: DC | PRN
  Administered 2017-03-03: 5 mg via ORAL
  Filled 2017-03-01: qty 1

## 2017-03-01 MED ORDER — ONDANSETRON HCL 4 MG/2ML IJ SOLN: 4.0000 mg | Freq: Four times a day (QID) | INTRAMUSCULAR | Status: DC | PRN

## 2017-03-01 MED ORDER — ATORVASTATIN CALCIUM 40 MG PO TABS
40.0000 mg | ORAL_TABLET | Freq: Every day | ORAL | Status: DC
  Administered 2017-03-01 – 2017-03-07 (×7): 40 mg via ORAL
  Filled 2017-03-01 (×7): qty 1

## 2017-03-01 MED ORDER — BISACODYL 5 MG PO TBEC
10.0000 mg | DELAYED_RELEASE_TABLET | Freq: Every day | ORAL | Status: DC
  Administered 2017-03-02 – 2017-03-04 (×3): 10 mg via ORAL
  Filled 2017-03-01 (×6): qty 2

## 2017-03-01 MED ORDER — NALOXONE HCL 0.4 MG/ML IJ SOLN: 0.4000 mg | INTRAMUSCULAR | Status: DC | PRN

## 2017-03-01 MED ORDER — DEXTROSE 5 % IV SOLN
1.5000 g | INTRAVENOUS | Status: AC
  Administered 2017-03-01: 1.5 g via INTRAVENOUS
  Filled 2017-03-01: qty 1.5

## 2017-03-01 MED ORDER — LIDOCAINE HCL (CARDIAC) 20 MG/ML IV SOLN
INTRAVENOUS | Status: DC | PRN
  Administered 2017-03-01: 40 mg via INTRAVENOUS

## 2017-03-01 MED ORDER — DIPHENHYDRAMINE HCL 50 MG/ML IJ SOLN: 12.5000 mg | Freq: Four times a day (QID) | INTRAMUSCULAR | Status: DC | PRN

## 2017-03-01 MED ORDER — 0.9 % SODIUM CHLORIDE (POUR BTL) OPTIME
TOPICAL | Status: DC | PRN
  Administered 2017-03-01: 2000 mL

## 2017-03-01 MED ORDER — ASPIRIN EC 81 MG PO TBEC
81.0000 mg | DELAYED_RELEASE_TABLET | Freq: Every day | ORAL | Status: DC
  Administered 2017-03-01 – 2017-03-08 (×8): 81 mg via ORAL
  Filled 2017-03-01 (×8): qty 1

## 2017-03-01 MED ORDER — MIDAZOLAM HCL 5 MG/5ML IJ SOLN
INTRAMUSCULAR | Status: DC | PRN
  Administered 2017-03-01: 1 mg via INTRAVENOUS

## 2017-03-01 MED ORDER — ROCURONIUM BROMIDE 100 MG/10ML IV SOLN
INTRAVENOUS | Status: DC | PRN
  Administered 2017-03-01: 20 mg via INTRAVENOUS
  Administered 2017-03-01: 50 mg via INTRAVENOUS

## 2017-03-01 MED ORDER — ACETAMINOPHEN 500 MG PO TABS
1000.0000 mg | ORAL_TABLET | Freq: Four times a day (QID) | ORAL | Status: DC
  Administered 2017-03-01: 1000 mg via ORAL
  Filled 2017-03-01: qty 2

## 2017-03-01 MED ORDER — TALC (STERITALC) POWDER FOR INTRAPLEURAL USE
4.0000 g | Freq: Once | INTRAPLEURAL | Status: DC
  Filled 2017-03-01: qty 4

## 2017-03-01 MED ORDER — DEXTROSE 5 % IV SOLN
1.5000 g | Freq: Two times a day (BID) | INTRAVENOUS | Status: AC
  Administered 2017-03-01 – 2017-03-02 (×2): 1.5 g via INTRAVENOUS
  Filled 2017-03-01 (×2): qty 1.5

## 2017-03-01 MED ORDER — FERROUS SULFATE 325 (65 FE) MG PO TABS
325.0000 mg | ORAL_TABLET | Freq: Every day | ORAL | Status: DC
  Administered 2017-03-02 – 2017-03-08 (×7): 325 mg via ORAL
  Filled 2017-03-01 (×7): qty 1

## 2017-03-01 MED ORDER — ALBUTEROL SULFATE (2.5 MG/3ML) 0.083% IN NEBU
2.5000 mg | INHALATION_SOLUTION | Freq: Four times a day (QID) | RESPIRATORY_TRACT | Status: DC
  Administered 2017-03-02: 2.5 mg via RESPIRATORY_TRACT
  Filled 2017-03-01: qty 3

## 2017-03-01 MED ORDER — MIDAZOLAM HCL 2 MG/2ML IJ SOLN
INTRAMUSCULAR | Status: AC
  Filled 2017-03-01: qty 2

## 2017-03-01 MED ORDER — ALBUTEROL SULFATE (2.5 MG/3ML) 0.083% IN NEBU
2.5000 mg | INHALATION_SOLUTION | RESPIRATORY_TRACT | Status: DC
  Administered 2017-03-01 (×3): 2.5 mg via RESPIRATORY_TRACT
  Filled 2017-03-01 (×3): qty 3

## 2017-03-01 MED ORDER — DIPHENHYDRAMINE HCL 12.5 MG/5ML PO ELIX: 12.5000 mg | ORAL_SOLUTION | Freq: Four times a day (QID) | ORAL | Status: DC | PRN

## 2017-03-01 MED ORDER — SODIUM CHLORIDE 0.9% FLUSH: 9.0000 mL | INTRAVENOUS | Status: DC | PRN

## 2017-03-01 MED ORDER — ONDANSETRON HCL 4 MG/2ML IJ SOLN
INTRAMUSCULAR | Status: DC | PRN
  Administered 2017-03-01: 4 mg via INTRAVENOUS

## 2017-03-01 MED ORDER — LACTATED RINGERS IV SOLN
INTRAVENOUS | Status: DC | PRN
Start: 2017-03-01 — End: 2017-03-01
  Administered 2017-03-01: 07:00:00 via INTRAVENOUS

## 2017-03-01 MED ORDER — PROMETHAZINE HCL 25 MG/ML IJ SOLN: 6.2500 mg | INTRAMUSCULAR | Status: DC | PRN

## 2017-03-01 MED ORDER — SENNOSIDES-DOCUSATE SODIUM 8.6-50 MG PO TABS
1.0000 | ORAL_TABLET | Freq: Every day | ORAL | Status: DC
  Administered 2017-03-01 – 2017-03-06 (×5): 1 via ORAL
  Filled 2017-03-01 (×6): qty 1

## 2017-03-01 MED ORDER — POTASSIUM CHLORIDE 10 MEQ/50ML IV SOLN: 10.0000 meq | Freq: Every day | INTRAVENOUS | Status: DC | PRN

## 2017-03-01 MED ORDER — SUGAMMADEX SODIUM 200 MG/2ML IV SOLN
INTRAVENOUS | Status: DC | PRN
  Administered 2017-03-01: 157.8 mg via INTRAVENOUS

## 2017-03-01 MED ORDER — PHENYLEPHRINE HCL 10 MG/ML IJ SOLN
INTRAVENOUS | Status: DC | PRN
  Administered 2017-03-01: 25 ug/min via INTRAVENOUS
  Administered 2017-03-01: 50 ug/min via INTRAVENOUS

## 2017-03-01 MED ORDER — PROPOFOL 10 MG/ML IV BOLUS
INTRAVENOUS | Status: AC
  Filled 2017-03-01: qty 20

## 2017-03-01 MED ORDER — METOPROLOL TARTRATE 50 MG PO TABS
75.0000 mg | ORAL_TABLET | Freq: Two times a day (BID) | ORAL | Status: DC
  Administered 2017-03-02 – 2017-03-08 (×13): 75 mg via ORAL
  Filled 2017-03-01 (×13): qty 1

## 2017-03-01 MED ORDER — FENTANYL CITRATE (PF) 100 MCG/2ML IJ SOLN: 25.0000 ug | INTRAMUSCULAR | Status: DC | PRN

## 2017-03-01 MED ORDER — FUROSEMIDE 40 MG PO TABS
40.0000 mg | ORAL_TABLET | Freq: Every day | ORAL | Status: DC
  Administered 2017-03-02 – 2017-03-08 (×7): 40 mg via ORAL
  Filled 2017-03-01 (×7): qty 1

## 2017-03-01 MED ORDER — FENTANYL 40 MCG/ML IV SOLN
INTRAVENOUS | Status: DC
  Administered 2017-03-01: 80 ug via INTRAVENOUS
  Administered 2017-03-01: 1000 ug via INTRAVENOUS
  Administered 2017-03-01 – 2017-03-02 (×2): 15 ug via INTRAVENOUS
  Administered 2017-03-02 (×2): 0 ug via INTRAVENOUS
  Filled 2017-03-01: qty 25

## 2017-03-01 MED ORDER — MOMETASONE FURO-FORMOTEROL FUM 200-5 MCG/ACT IN AERO
2.0000 | INHALATION_SPRAY | Freq: Two times a day (BID) | RESPIRATORY_TRACT | Status: DC
  Administered 2017-03-02 – 2017-03-08 (×12): 2 via RESPIRATORY_TRACT
  Filled 2017-03-01 (×2): qty 8.8

## 2017-03-01 MED ORDER — DEXAMETHASONE SODIUM PHOSPHATE 10 MG/ML IJ SOLN
INTRAMUSCULAR | Status: DC | PRN
  Administered 2017-03-01: 10 mg via INTRAVENOUS

## 2017-03-01 MED ORDER — LIDOCAINE HCL (CARDIAC) 20 MG/ML IV SOLN: INTRAVENOUS | Status: DC | PRN

## 2017-03-01 MED ORDER — IRON 28 MG PO TABS: 28.0000 mg | ORAL_TABLET | Freq: Every day | ORAL | Status: DC

## 2017-03-01 MED ORDER — LACTATED RINGERS IV SOLN
INTRAVENOUS | Status: DC | PRN
  Administered 2017-03-01: 07:00:00 via INTRAVENOUS

## 2017-03-01 MED ORDER — ACETAMINOPHEN 160 MG/5ML PO SOLN: 1000.0000 mg | Freq: Four times a day (QID) | ORAL | Status: DC

## 2017-03-01 MED ORDER — TALC 5 G PL SUSR
INTRAPLEURAL | Status: DC | PRN
  Administered 2017-03-01: 4 g via INTRAPLEURAL

## 2017-03-01 SURGICAL SUPPLY — 53 items
ADH SKN CLS APL DERMABOND .7 (GAUZE/BANDAGES/DRESSINGS) ×1
BRUSH CYTOL CELLEBRITY 1.5X140 (MISCELLANEOUS) ×4 IMPLANT
CANISTER SUCT 3000ML PPV (MISCELLANEOUS) ×4 IMPLANT
CATH ROBINSON RED A/P 20FR (CATHETERS) ×4 IMPLANT
CLIP TI MEDIUM 6 (CLIP) ×4 IMPLANT
CONN ST 1/4X3/8  BEN (MISCELLANEOUS) ×2
CONN ST 1/4X3/8 BEN (MISCELLANEOUS) ×2 IMPLANT
CONT SPEC 4OZ CLIKSEAL STRL BL (MISCELLANEOUS) ×16 IMPLANT
COVER BACK TABLE 60X90IN (DRAPES) ×4 IMPLANT
DERMABOND ADVANCED (GAUZE/BANDAGES/DRESSINGS) ×2
DERMABOND ADVANCED .7 DNX12 (GAUZE/BANDAGES/DRESSINGS) ×2 IMPLANT
DRAIN CHANNEL 32F RND 10.7 FF (WOUND CARE) ×4 IMPLANT
DRAPE LAPAROSCOPIC ABDOMINAL (DRAPES) ×4 IMPLANT
ELECT BLADE 4.0 EZ CLEAN MEGAD (MISCELLANEOUS) ×4
ELECT REM PT RETURN 9FT ADLT (ELECTROSURGICAL) ×4
ELECTRODE BLDE 4.0 EZ CLN MEGD (MISCELLANEOUS) ×2 IMPLANT
ELECTRODE REM PT RTRN 9FT ADLT (ELECTROSURGICAL) ×2 IMPLANT
FORCEPS BIOP RJ4 1.8 (CUTTING FORCEPS) ×4 IMPLANT
GAUZE SPONGE 4X4 12PLY STRL (GAUZE/BANDAGES/DRESSINGS) ×4 IMPLANT
GAUZE SPONGE 4X4 12PLY STRL LF (GAUZE/BANDAGES/DRESSINGS) ×4 IMPLANT
GLOVE BIOGEL PI IND STRL 6.5 (GLOVE) ×2 IMPLANT
GLOVE BIOGEL PI IND STRL 7.0 (GLOVE) ×4 IMPLANT
GLOVE BIOGEL PI INDICATOR 6.5 (GLOVE) ×2
GLOVE BIOGEL PI INDICATOR 7.0 (GLOVE) ×4
GLOVE EUDERMIC 7 POWDERFREE (GLOVE) ×12 IMPLANT
GLOVE SURG SS PI 6.0 STRL IVOR (GLOVE) ×4 IMPLANT
GLOVE SURG SS PI 6.5 STRL IVOR (GLOVE) ×8 IMPLANT
GOWN STRL REUS W/ TWL LRG LVL3 (GOWN DISPOSABLE) ×6 IMPLANT
GOWN STRL REUS W/ TWL XL LVL3 (GOWN DISPOSABLE) ×2 IMPLANT
GOWN STRL REUS W/TWL LRG LVL3 (GOWN DISPOSABLE) ×12
GOWN STRL REUS W/TWL XL LVL3 (GOWN DISPOSABLE) ×4
KIT BASIN OR (CUSTOM PROCEDURE TRAY) ×4 IMPLANT
KIT ROOM TURNOVER OR (KITS) ×4 IMPLANT
MARKER SKIN DUAL TIP RULER LAB (MISCELLANEOUS) ×4 IMPLANT
NS IRRIG 1000ML POUR BTL (IV SOLUTION) ×12 IMPLANT
PACK CHEST (CUSTOM PROCEDURE TRAY) ×4 IMPLANT
PAD ARMBOARD 7.5X6 YLW CONV (MISCELLANEOUS) ×12 IMPLANT
SOLUTION ANTI FOG 6CC (MISCELLANEOUS) ×4 IMPLANT
SUT SILK 2 0 SH (SUTURE) ×4 IMPLANT
SUT VIC AB 2-0 CT1 27 (SUTURE) ×2
SUT VIC AB 2-0 CT1 TAPERPNT 27 (SUTURE) ×2 IMPLANT
SUT VIC AB 2-0 CTX 36 (SUTURE) ×4 IMPLANT
SUT VIC AB 3-0 MH 27 (SUTURE) ×8 IMPLANT
SUT VIC AB 3-0 X1 27 (SUTURE) ×4 IMPLANT
SYR 20ML ECCENTRIC (SYRINGE) ×4 IMPLANT
SYR 5ML LUER SLIP (SYRINGE) ×4 IMPLANT
SYSTEM SAHARA CHEST DRAIN ATS (WOUND CARE) ×4 IMPLANT
TAPE CLOTH SURG 6X10 WHT LF (GAUZE/BANDAGES/DRESSINGS) ×4 IMPLANT
TRAP SPECIMEN MUCOUS 40CC (MISCELLANEOUS) ×4 IMPLANT
TRAY FOLEY W/METER SILVER 14FR (SET/KITS/TRAYS/PACK) ×4 IMPLANT
TUBE CONNECTING 12'X1/4 (SUCTIONS) ×1
TUBE CONNECTING 12X1/4 (SUCTIONS) ×3 IMPLANT
WATER STERILE IRR 1000ML POUR (IV SOLUTION) ×4 IMPLANT

## 2017-03-01 NOTE — Brief Op Note (Signed)
03/01/2017  9:32 AM  PATIENT:  Woodroe Mode  76 y.o. male  PRE-OPERATIVE DIAGNOSIS:  RIGHT PLEURAL EFFUSION  POST-OPERATIVE DIAGNOSIS:  RIGHT PLEURAL EFFUSION  PROCEDURE:  Procedure(s): FLEXIBLE BRONCHOSCOPY (N/A) VIDEO ASSISTED THORACOSCOPY (Right) DRAINAGE OF PLEURAL EFFUSION (Right)  Talc Pleurodesis.  SURGEON:  Surgeon(s) and Role:    * Sahar Ryback, Fernande Boyden, MD - Primary  PHYSICIAN ASSISTANT: Nicholes Rough, PA-C    ANESTHESIA:   general  EBL:  Total I/O In: -  Out: 150 [Urine:150]  BLOOD ADMINISTERED:none  DRAINS: (one 70F) Blake drain(s) in the right pleural space   LOCAL MEDICATIONS USED:  NONE  SPECIMEN:  Source of Specimen:  right pleural fluid and right pleural biopsy, RML transbronchial biosies  DISPOSITION OF SPECIMEN:  PATHOLOGY  COUNTS:  YES  TOURNIQUET:  * No tourniquets in log *  DICTATION: .Note written in EPIC  PLAN OF CARE: Admit to inpatient   PATIENT DISPOSITION:  PACU - hemodynamically stable.   Delay start of Pharmacological VTE agent (>24hrs) due to surgical blood loss or risk of bleeding: yes

## 2017-03-01 NOTE — Op Note (Signed)
CARDIOTHORACIC SURGERY OPERATIVE NOTE:  LASH MATULICH [id number] 03/01/2017   Preoperative Dx:  [name] s/p chemo/XRT with recurrent right pleural effusion  Postoperative Dx: Same   Procedure:   Flexible bronchoscopy with biopsy of RML  Right video-assisted thoracoscopy with drainage of loculated pleural effusion, biopsy of pleura and talc pleurodesis.  Surgeon: Dr. Gaye Pollack   Assistant: Nicholes Rough, PA-C  Anesthesia: GET   Clinical History:   The patient is a 76 year old gentleman with a history of hypertension, hyperlipidemia, COPD, remote smoking and CAD s/p CABG by me in 1996 with subsequent stenting of the distal LM and SVG to the diagonal a few years ago. 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 He was treated with concurrent chemoradiation. He has developed shortness of breath and cough with a recurrent right pleural effusion. He underwent thoracentesis on 06/22/2017 removing 1.2 L of serous fluid and a thoracentesis on 02/11/2017 removing only 280 cc of dark, bloody fluid. Cytology on both specimens was negative for malignancy. He has been treated with 2 courses of antibiotics by Dr. Lenna Gilford as well as a tapering dose of prednisone.  He is doing fairly well overall but still have some shortness of breath, right back pain and a cough productive of clear mucous. He has a recurrent right pleural effusion with cough and back pain and shortness of breath following chemoradiation for Stage IIIA NSCLCa in 77/2017. The CT shows mass like consolidation in the RML and scattered nodularity in both lower lobes. Thoracentesis x 2 over the past 9 months has been negative for malignancy. There is an enhancing rim around the pleural effusion suggesting a loculated collection. I think the best treatment is bronchoscopy with biopsy and brushings of the RML followed by right VATS for drainage of the  loculated effusion and examination of the pleural space followed by talc pleurodesis. This should allow [place] to diagnose malignancy if present and prevent recurrence of the right pleural effusion. I discussed the procedure with the patient in detail including alternatives, benefits and risk, including but not limited to bleeding, infection, injury to the lung, recurrent effusion and he understands and agrees to proceed.   Operative procedure:   The patient was seen in the preoperative holding area. The proper patient, proper operative side, proper operation were confirmed after reviewing his history and chest x-ray. The right side of the chest was signed by me. Preoperative intravenous antibiotics were given. He was taken back to the operating room and placed on table in the supine position. After induction of general endotracheal anesthesia using a single lumen tube, a Foley catheter was placed in the bladder using sterile technique. Lower extremity sequential compression devices were used.    Flexible video-bronchoscopy:  The video bronchoscope was passed down the endotracheal tube. The distal trachea was normal. The carina was sharp. The left bronchial tree had normal segmental anatomy with no endobronchial lesions or extrinsic compression. The right bronchial tree had normal segmental anatomy with no endobronchial lesions or extrinsic compression. I took several transbronchial biopsies of the RML and then brushings of the RML. There was complete hemostasis.   Right Thoracoscopy:   The patient was positioned in the left lateral decubitus position with the right side up. The right side of the chest was prepped with Betadine soap and solution and draped in the usual sterile manner. A timeout was taken and the  proper patient, proper operation, and proper operative side were confirmed with nursing and anesthesia staff. A 1 cm incision was made in the midaxillary line at about the eighth intercostal  space. The left pleural space was entered bluntly with a hemostat and an 8 mm trocar was inserted. The 30 thoracoscope was inserted and the pleural space inspected. There was a multiloculated pleural fluid collection inferiorly and posteriorly and this was completely drained. A second 1 cm incision was made in the posterior axillary line at the 7th ICS for instruments. The loculations were broken up and all of the fibrinous material removed. It did not look infected. The lung was firmly adherent to the chest wall except where this cavity had developed posteriorly and over the diaphragm. The parietal pleura appeared thickened, possibly from radiation but a piece was sent for biopsy. No discrete metastases were seen on the visceral or parietal pleura. Then 4 grams of sterile talc was instilled into the space and a 32 F Blake drain inserted through the mid-axillary incisions. The other incision was closed with 2-0 Vicryl suture in the muscle and 3-0 vicryl subcuticular skin suture. The chest tube was connected to Pleur-evac suction. The sponge needle and instrument counts were correct according to the scrub nurse. The patient was then turned into the supine position, extubated, and transported to the post anesthesia care unit in satisfactory and stable condition.

## 2017-03-01 NOTE — Progress Notes (Signed)
After patient returned back to bed he developed an air leak in his chest tube. After checking his connections and closed drainage system we discovered that the leak was coming from the site of the chest tube. A STAT portable x-ray of the chest was ordered and Roxan Hockey, MD notified. X-ray finding show no acute changes from previous x-ray. An audible air leak is still present but patient is otherwise unchanged. Sating at 97-100%, reports no pain, and is resting comfortably. Will continue to monitor. Lenna Sciara, RN 11:05 PM

## 2017-03-01 NOTE — Anesthesia Postprocedure Evaluation (Signed)
Anesthesia Post Note  Patient: Paul Sandoval  Procedure(s) Performed: Procedure(s) (LRB): FLEXIBLE BRONCHOSCOPY WITH BIOPSIES (N/A) VIDEO ASSISTED THORACOSCOPY WITH BIOPSIES (Right) DRAINAGE OF PLEURAL EFFUSION (Right) TALC PLEURADESIS (Right)     Patient location during evaluation: PACU Anesthesia Type: General Level of consciousness: awake and alert and oriented Pain management: pain level controlled Vital Signs Assessment: post-procedure vital signs reviewed and stable Respiratory status: spontaneous breathing, nonlabored ventilation, respiratory function stable and patient connected to nasal cannula oxygen Cardiovascular status: blood pressure returned to baseline and stable Postop Assessment: no signs of nausea or vomiting Anesthetic complications: no    Last Vitals:  Vitals:   03/01/17 1100 03/01/17 1103  BP:  122/69  Pulse: 82 80  Resp: 19 (!) 5  Temp:      Last Pain:  Vitals:   03/01/17 1051  TempSrc:   PainSc: 0-No pain                 Rande Roylance A.

## 2017-03-01 NOTE — Anesthesia Procedure Notes (Signed)
Procedure Name: Intubation Date/Time: 03/01/2017 7:35 AM Performed by: Clearnce Sorrel Pre-anesthesia Checklist: Emergency Drugs available, Suction available, Patient being monitored, Timeout performed and Patient identified Patient Re-evaluated:Patient Re-evaluated prior to inductionOxygen Delivery Method: Circle system utilized Preoxygenation: Pre-oxygenation with 100% oxygen Intubation Type: IV induction Ventilation: Mask ventilation without difficulty Laryngoscope Size: Mac and 3 Grade View: Grade III Tube type: Oral Tube size: 9.0 mm Number of attempts: 1 Airway Equipment and Method: Stylet Placement Confirmation: ETT inserted through vocal cords under direct vision,  positive ETCO2 and breath sounds checked- equal and bilateral Secured at: 22 cm Tube secured with: Tape Dental Injury: Teeth and Oropharynx as per pre-operative assessment

## 2017-03-01 NOTE — Transfer of Care (Signed)
Immediate Anesthesia Transfer of Care Note  Patient: Paul Sandoval  Procedure(s) Performed: Procedure(s): FLEXIBLE BRONCHOSCOPY (N/A) VIDEO ASSISTED THORACOSCOPY (Right) DRAINAGE OF PLEURAL EFFUSION (Right)  Patient Location: PACU  Anesthesia Type:General  Level of Consciousness: awake, alert  and oriented  Airway & Oxygen Therapy: Patient Spontanous Breathing and Patient connected to nasal cannula oxygen  Post-op Assessment: Report given to RN and Post -op Vital signs reviewed and stable  Post vital signs: Reviewed and stable  Last Vitals:  Vitals:   03/01/17 0557 03/01/17 1005  BP: (!) 122/57   Pulse: 96   Resp: 16   Temp: 36.5 C 36.6 C    Last Pain:  Vitals:   03/01/17 0557  TempSrc: Oral         Complications: No apparent anesthesia complications

## 2017-03-01 NOTE — Anesthesia Procedure Notes (Signed)
Arterial Line Insertion Start/End7/01/2017 7:15 AM Performed by: Clearnce Sorrel, CRNA  Patient location: Pre-op. Preanesthetic checklist: patient identified, IV checked, site marked, risks and benefits discussed, surgical consent, monitors and equipment checked, pre-op evaluation, timeout performed and anesthesia consent Lidocaine 1% used for infiltration Left, radial was placed Catheter size: 20 Fr Hand hygiene performed  and maximum sterile barriers used   Attempts: 1 Procedure performed without using ultrasound guided technique. Following insertion, dressing applied. Post procedure assessment: normal and unchanged

## 2017-03-01 NOTE — Interval H&P Note (Signed)
History and Physical Interval Note:  03/01/2017 5:29 AM  Woodroe Mode  has presented today for surgery, with the diagnosis of RIGHT PLEURAL EFFUSION  The various methods of treatment have been discussed with the patient and family. After consideration of risks, benefits and other options for treatment, the patient has consented to  Procedure(s): FLEXIBLE BRONCHOSCOPY (N/A) VIDEO ASSISTED THORACOSCOPY (Right) DRAINAGE OF PLEURAL EFFUSION (Right) as a surgical intervention .  The patient's history has been reviewed, patient examined, no change in status, stable for surgery.  I have reviewed the patient's chart and labs.  Questions were answered to the patient's satisfaction.     Gaye Pollack

## 2017-03-02 ENCOUNTER — Inpatient Hospital Stay (HOSPITAL_COMMUNITY): Payer: Medicare Other

## 2017-03-02 ENCOUNTER — Encounter (HOSPITAL_COMMUNITY): Payer: Self-pay | Admitting: Surgery

## 2017-03-02 LAB — CBC
HEMATOCRIT: 29.8 % — AB (ref 39.0–52.0)
HEMOGLOBIN: 9.2 g/dL — AB (ref 13.0–17.0)
MCH: 26.1 pg (ref 26.0–34.0)
MCHC: 30.9 g/dL (ref 30.0–36.0)
MCV: 84.7 fL (ref 78.0–100.0)
Platelets: 136 10*3/uL — ABNORMAL LOW (ref 150–400)
RBC: 3.52 MIL/uL — AB (ref 4.22–5.81)
RDW: 17.2 % — ABNORMAL HIGH (ref 11.5–15.5)
WBC: 12.6 10*3/uL — ABNORMAL HIGH (ref 4.0–10.5)

## 2017-03-02 LAB — BASIC METABOLIC PANEL
Anion gap: 5 (ref 5–15)
BUN: 13 mg/dL (ref 6–20)
CHLORIDE: 103 mmol/L (ref 101–111)
CO2: 26 mmol/L (ref 22–32)
CREATININE: 1.14 mg/dL (ref 0.61–1.24)
Calcium: 8.6 mg/dL — ABNORMAL LOW (ref 8.9–10.3)
GFR calc non Af Amer: >60 mL/min (ref >60)
Glucose, Bld: 161 mg/dL — ABNORMAL HIGH (ref 65–99)
POTASSIUM: 4.3 mmol/L (ref 3.5–5.1)
SODIUM: 134 mmol/L — AB (ref 135–145)

## 2017-03-02 MED ORDER — LACTULOSE 10 GM/15ML PO SOLN
20.0000 g | Freq: Every day | ORAL | Status: DC | PRN
  Administered 2017-03-02: 20 g via ORAL
  Filled 2017-03-02: qty 30

## 2017-03-02 MED ORDER — ACETAMINOPHEN 160 MG/5ML PO SOLN: 1000.0000 mg | Freq: Four times a day (QID) | ORAL | Status: DC | PRN

## 2017-03-02 MED ORDER — ACETAMINOPHEN 500 MG PO TABS
1000.0000 mg | ORAL_TABLET | Freq: Four times a day (QID) | ORAL | Status: DC | PRN
  Administered 2017-03-02 – 2017-03-07 (×9): 1000 mg via ORAL
  Filled 2017-03-02 (×9): qty 2

## 2017-03-02 MED ORDER — ALBUTEROL SULFATE (2.5 MG/3ML) 0.083% IN NEBU: 2.5000 mg | INHALATION_SOLUTION | RESPIRATORY_TRACT | Status: DC | PRN

## 2017-03-02 NOTE — Progress Notes (Addendum)
      [address]       [place] [address]             [phone]      1 Day Post-Op Procedure(s) (LRB): FLEXIBLE BRONCHOSCOPY WITH BIOPSIES (N/A) VIDEO ASSISTED THORACOSCOPY WITH BIOPSIES (Right) DRAINAGE OF PLEURAL EFFUSION (Right) TALC PLEURADESIS (Right)   Subjective:  No complaints.  Minimal pain doesn't want PCA.  Wants to get up and ambulate  Objective: Vital signs in last 24 hours: Temp:  [97 F (36.1 C)-98.4 F (36.9 C)] 97 F (36.1 C) (07/07 0803) Pulse Rate:  [80-112] 107 (07/07 0803) Cardiac Rhythm: Normal sinus rhythm (07/07 0730) Resp:  [5-26] 18 (07/07 0803) BP: (100-138)/(51-111) 138/85 (07/07 0803) SpO2:  [92 %-100 %] 100 % (07/07 0803) Arterial Line BP: (134-195)/(30-82) 134/30 (07/07 0403)  Intake/Output from previous day: 07/06 0701 - 07/07 0700 In: 1573.8 [P.O.:600; I.V.:973.8] Out: 1835 [RVUYE:3343; Blood:50; Chest Tube:190] Intake/Output this shift: Total I/O In: 480 [P.O.:480] Out: 810 [Urine:600; Chest Tube:210]  General appearance: alert, cooperative and no distress Heart: regular rate and rhythm Lungs: clear to auscultation bilaterally Abdomen: soft, non-tender; bowel sounds normal; no masses,  no organomegaly Wound: clean and dry, chest tube in place  Lab Results:  Recent Labs  02/28/17 0825 03/02/17 0528  WBC 9.0 12.6*  HGB 11.0* 9.2*  HCT 36.5* 29.8*  PLT 163 136*   BMET:  Recent Labs  02/28/17 0825 03/02/17 0528  NA 133* 134*  K 4.1 4.3  CL 100* 103  CO2 21* 26  GLUCOSE 104* 161*  BUN 13 13  CREATININE 1.20 1.14  CALCIUM 8.8* 8.6*    PT/INR:  Recent Labs  02/28/17 0825  LABPROT 13.6  INR 1.04   ABG    Component Value Date/Time   PHART 7.480 (H) 02/28/2017 0827   HCO3 26.5 02/28/2017 0827   TCO2 29 11/11/2015 2041   O2SAT 97.1 02/28/2017 0827   CBG (last 3)  No results for input(s): GLUCAP in the last 72 hours.  Assessment/Plan: S/P Procedure(s) (LRB): FLEXIBLE BRONCHOSCOPY WITH  BIOPSIES (N/A) VIDEO ASSISTED THORACOSCOPY WITH BIOPSIES (Right) DRAINAGE OF PLEURAL EFFUSION (Right) TALC PLEURADESIS (Right)  1. Chest tube- developed air leak last night- air leak remains present, chest tube is in place, placed new dressing, ? Basilar pneumothorax on CXR leave on suction today, output at 210 2.CV- NSR, on Lopressor at 75 mg BID 3. D/C Arterial line 4. IV fluids to [facility] patient with good oral intake 5. Pain control- d/c PCA, patient request tylenol 6. Stage III NSCLC, S/P Chemo,radiation, right lung biopsy remains pending 7. Dispo- patient stable, chest tube to suction today, repeat CXr in AM   LOS: 1 day    BARRETT, ERIN 03/02/2017 Patient seen and examined, agree with above CXR shows tube in small space, a lot of tidal variation in tube, small air leak  Remo Lipps C. Roxan Hockey, MD [facility] [phone]

## 2017-03-03 ENCOUNTER — Inpatient Hospital Stay (HOSPITAL_COMMUNITY): Payer: Medicare Other

## 2017-03-03 MED ORDER — GUAIFENESIN ER 600 MG PO TB12
1200.0000 mg | ORAL_TABLET | Freq: Two times a day (BID) | ORAL | Status: DC
  Administered 2017-03-03 – 2017-03-08 (×11): 1200 mg via ORAL
  Filled 2017-03-03 (×11): qty 2

## 2017-03-03 MED ORDER — LEVALBUTEROL HCL 0.63 MG/3ML IN NEBU
0.6300 mg | INHALATION_SOLUTION | Freq: Three times a day (TID) | RESPIRATORY_TRACT | Status: DC
  Administered 2017-03-03 (×3): 0.63 mg via RESPIRATORY_TRACT
  Filled 2017-03-03 (×3): qty 3

## 2017-03-03 MED ORDER — LEVALBUTEROL HCL 0.63 MG/3ML IN NEBU
0.6300 mg | INHALATION_SOLUTION | Freq: Three times a day (TID) | RESPIRATORY_TRACT | Status: DC
  Administered 2017-03-04 – 2017-03-08 (×13): 0.63 mg via RESPIRATORY_TRACT
  Filled 2017-03-03 (×13): qty 3

## 2017-03-03 NOTE — Plan of Care (Signed)
Problem: Activity: Goal: Risk for activity intolerance will decrease Outcome: Progressing Verbalized understanding  Problem: Cardiac: Goal: Hemodynamic stability will improve Outcome: Progressing Verbalized understanding  Problem: Education: Goal: Ability to demonstrate proper wound will improve Outcome: Progressing Verbalized understanding Goal: Knowledge of disease or condition will improve Outcome: Progressing Verbalized understanding Goal: Knowledge of the prescribed therapeutic regimen will improve Outcome: Progressing Verbalized understanding  Problem: Physical Regulation: Goal: Postoperative complications will be avoided or minimized Outcome: Progressing Verbalized understanding  Problem: Respiratory: Goal: Pain level will decrease with appropriate interventions Outcome: Progressing Verbalized understanding Goal: Respiratory status will improve Outcome: Progressing Verbalized understanding Goal: Chest tube patency will be maintained Outcome: Progressing Verbalized understanding  Problem: Pain Management: Goal: Pain level will decrease Outcome: Progressing Verbalized understanding  Problem: Skin Integrity: Goal: Wound healing without signs and symptoms infection will improve Outcome: Progressing Verbalized understanding

## 2017-03-03 NOTE — Progress Notes (Addendum)
      [address]       [place] [address]             [phone]      2 Days Post-Op Procedure(s) (LRB): FLEXIBLE BRONCHOSCOPY WITH BIOPSIES (N/A) VIDEO ASSISTED THORACOSCOPY WITH BIOPSIES (Right) DRAINAGE OF PLEURAL EFFUSION (Right) TALC PLEURADESIS (Right)   Subjective:  No new complaints.  States feeling pretty good.  Was happy he was able to get up and ambulate yesterday.  He does ask for nebulizer treatments and Mucinex both of which he was using at home.  Objective: Vital signs in last 24 hours: Temp:  [97.5 F (36.4 C)-98 F (36.7 C)] 97.8 F (36.6 C) (07/08 0740) Pulse Rate:  [83-111] 96 (07/08 0740) Cardiac Rhythm: Normal sinus rhythm;Bundle branch block (07/08 0752) Resp:  [12-27] 20 (07/08 0740) BP: (119-134)/(59-72) 119/72 (07/08 0740) SpO2:  [94 %-97 %] 96 % (07/08 0740)  Intake/Output from previous day: 07/07 0701 - 07/08 0700 In: 1900.4 [P.O.:718; I.V.:1182.4] Out: 3235 [Urine:2650; Chest Tube:580] Intake/Output this shift: Total I/O In: 0  Out: 300 [Urine:300]  General appearance: alert, cooperative and no distress Heart: regular rate and rhythm Lungs: diminished breath sounds right base Abdomen: soft, non-tender; bowel sounds normal; no masses,  no organomegaly Wound: clean and dry  Lab Results:  Recent Labs  03/02/17 0528  WBC 12.6*  HGB 9.2*  HCT 29.8*  PLT 136*   BMET:  Recent Labs  03/02/17 0528  NA 134*  K 4.3  CL 103  CO2 26  GLUCOSE 161*  BUN 13  CREATININE 1.14  CALCIUM 8.6*    PT/INR: No results for input(s): LABPROT, INR in the last 72 hours. ABG    Component Value Date/Time   PHART 7.480 (H) 02/28/2017 0827   HCO3 26.5 02/28/2017 0827   TCO2 29 11/11/2015 2041   O2SAT 97.1 02/28/2017 0827   CBG (last 3)  No results for input(s): GLUCAP in the last 72 hours.  Assessment/Plan: S/P Procedure(s) (LRB): FLEXIBLE BRONCHOSCOPY WITH BIOPSIES (N/A) VIDEO ASSISTED THORACOSCOPY WITH BIOPSIES  (Right) DRAINAGE OF PLEURAL EFFUSION (Right) TALC PLEURADESIS (Right)  1. Chest tube- + air leak, but improved from yesterday, continues to have tidaling in chest tube- 580 cc output yesterday- leave on suction today 2. Pulm- CXR no pneumothorax, stable appearance of pleural effusion, order xopenex nebs, Mucinex per home regimen 3. CV- NSR, BP controlled- on home Lopressor at 75 mg BID 4. Stage III NSCLC, S/P Chemo/Radiation- lung biopsy pathology remains pending 5. Dispo- patient stable, continued but improved air leak, tidaling remains present in chest tube tubing spoke with Dr. Roxan Hockey will place chest tube to water seal, ordered nebs/mucinex per patient request as uses as home and feels helps his breathing, continue current care   LOS: 2 days    Ahmed Prima, ERIN 03/03/2017 Patient seen and examined, agree with above Looks good No air leak currently on water seal Still with relatively high output- will dc tube when < 200/ day  Remo Lipps C. Roxan Hockey, MD [facility] [phone]

## 2017-03-04 ENCOUNTER — Inpatient Hospital Stay (HOSPITAL_COMMUNITY): Payer: Medicare Other

## 2017-03-04 LAB — GLUCOSE, CAPILLARY: GLUCOSE-CAPILLARY: 112 mg/dL — AB (ref 65–99)

## 2017-03-04 NOTE — Progress Notes (Addendum)
[address]       [place] [address]             [phone]      3 Days Post-Op Procedure(s) (LRB): FLEXIBLE BRONCHOSCOPY WITH BIOPSIES (N/A) VIDEO ASSISTED THORACOSCOPY WITH BIOPSIES (Right) DRAINAGE OF PLEURAL EFFUSION (Right) TALC PLEURADESIS (Right) Subjective: Feels pretty well, still having significant drainage  Objective: Vital signs in last 24 hours: Temp:  [97.4 F (36.3 C)-98.2 F (36.8 C)] 97.4 F (36.3 C) (07/09 0322) Pulse Rate:  [90-112] 90 (07/09 0322) Cardiac Rhythm: Sinus tachycardia (07/09 0322) Resp:  [18-98] 18 (07/09 0322) BP: (103-127)/(58-65) 116/65 (07/09 0322) SpO2:  [95 %-98 %] 95 % (07/09 0801)  Hemodynamic parameters for last 24 hours:    Intake/Output from previous day: 07/08 0701 - 07/09 0700 In: 960 [P.O.:960] Out: 1925 [Urine:1400; Chest Tube:525] Intake/Output this shift: Total I/O In: -  Out: 40 [Chest Tube:40]  General appearance: alert, cooperative and no distress Heart: regular rate and rhythm and occasion ectrasystole Lungs: coarse throughout Abdomen: benign Extremities: no edema Wound: dressings CDI  Lab Results:  Recent Labs  03/02/17 0528  WBC 12.6*  HGB 9.2*  HCT 29.8*  PLT 136*   BMET:  Recent Labs  03/02/17 0528  NA 134*  K 4.3  CL 103  CO2 26  GLUCOSE 161*  BUN 13  CREATININE 1.14  CALCIUM 8.6*    PT/INR: No results for input(s): LABPROT, INR in the last 72 hours. ABG    Component Value Date/Time   PHART 7.480 (H) 02/28/2017 0827   HCO3 26.5 02/28/2017 0827   TCO2 29 11/11/2015 2041   O2SAT 97.1 02/28/2017 0827   CBG (last 3)  No results for input(s): GLUCAP in the last 72 hours.  Meds Scheduled Meds: . aspirin EC  81 mg Oral Daily  . atorvastatin  40 mg Oral q1800  . bisacodyl  10 mg Oral Daily  . ferrous sulfate  325 mg Oral Q breakfast  . furosemide  40 mg Oral Daily  . guaiFENesin  1,200 mg Oral BID  . levalbuterol  0.63 mg Nebulization TID  . metoprolol  tartrate  75 mg Oral BID  . mometasone-formoterol  2 puff Inhalation BID  . senna-docusate  1 tablet Oral QHS   Continuous Infusions: . potassium chloride     PRN Meds:.acetaminophen **OR** acetaminophen (TYLENOL) oral liquid 160 mg/5 mL, albuterol, lactulose, ondansetron (ZOFRAN) IV, oxyCODONE, potassium chloride  Xrays Dg Chest Port 1 View  Result Date: 03/04/2017 CLINICAL DATA:  Follow-up pleural effusion EXAM: PORTABLE CHEST 1 VIEW COMPARISON:  Chest radiograph from one day prior. FINDINGS: Stable right basilar chest tube. Intact sternotomy wires. Stable cardiomediastinal silhouette with normal heart size and aortic atherosclerosis. Small right lateral basilar hydropneumothorax appears stable. No left pneumothorax. No left pleural effusion. Stable volume loss and consolidation in the medial apical right lung. Stable patchy opacity in the mid to lower right lung. IMPRESSION: 1. Right basilar chest tube with stable small right lateral basilar hydropneumothorax. 2. Stable patchy opacity in the mid to lower right lung, likely aspiration, pneumonia and/or atelectasis. 3. Stable radiation change in the medial right upper lung. Electronically Signed   By: Ilona Sorrel M.D.   On: 03/04/2017 07:28   Dg Chest Port 1 View  Result Date: 03/03/2017 CLINICAL DATA:  Pleural effusion. History of right upper lobe cancer EXAM: PORTABLE CHEST 1 VIEW COMPARISON:  Yesterday FINDINGS: Unchanged chest tube at the right base. No visible  pneumothorax. Post treatment volume loss and right apical opacity. Status post CABG. Stable heart size. The left lung is clear. IMPRESSION: 1. Stable small residual pleural fluid/thickening at the right base. No evidence of pneumothorax. 2. Stable right apical opacity, radiation fibrosis and loculated pleural fluid by 02/13/2017 CT. Electronically Signed   By: Monte Fantasia M.D.   On: 03/03/2017 09:00    Assessment/Plan: S/P Procedure(s) (LRB): FLEXIBLE BRONCHOSCOPY WITH BIOPSIES  (N/A) VIDEO ASSISTED THORACOSCOPY WITH BIOPSIES (Right) DRAINAGE OF PLEURAL EFFUSION (Right) TALC PLEURADESIS (Right)  1 hemodyn stable, a little tachy 2 no air leak, &&% out yesterday- leave tube 3 cont aggressive pulm toilet, ambulate as able 4 CXR is stable, no new labs  LOS: 3 days    Paul Sandoval,Paul Sandoval 03/04/2017  more then 500 ml from chest tube past day, no air leak . Leave ct today poss remove in am if drainage decreases  I have seen and examined Paul Sandoval and agree with the above assessment  and plan.  Grace Isaac MD Beeper [phone] Office [phone] 03/04/2017 2:14 PM

## 2017-03-04 NOTE — Care Management Note (Signed)
Case Management Note  Patient Details  [name] MRN: [id number] Date of Birth: 25-Jun-1941  Subjective/Objective:   From home with wife, pta indep, post op day 3 R VATS drainage of pl effusion, has chest tube to water seal, for possible removal of chest tube in am.                 Action/Plan: NCM will follow for dc needs.   Expected Discharge Date:                  Expected Discharge Plan:  Home/Self Care  In-House Referral:     Discharge planning Services  CM Consult  Post Acute Care Choice:    Choice offered to:     DME Arranged:    DME Agency:     HH Arranged:    HH Agency:     Status of Service:  In process, will continue to follow  If discussed at Long Length of Stay Meetings, dates discussed:    Additional Comments:  Zenon Mayo, RN 03/04/2017, 4:24 PM

## 2017-03-05 ENCOUNTER — Inpatient Hospital Stay (HOSPITAL_COMMUNITY): Payer: Medicare Other

## 2017-03-05 MED ORDER — ALBUTEROL SULFATE (2.5 MG/3ML) 0.083% IN NEBU: 2.5000 mg | INHALATION_SOLUTION | RESPIRATORY_TRACT | Status: DC | PRN

## 2017-03-05 NOTE — Care Management Important Message (Signed)
Important Message  Patient Details  [name] MRN: [id number] Date of Birth: [birth year]   Medicare Important Message Given:  Yes    Orbie Pyo 03/05/2017, 2:03 PM

## 2017-03-05 NOTE — Discharge Summary (Signed)
Physician Discharge Summary  Patient ID: Paul Sandoval MRN: [id number] DOB/AGE: 1941/06/30 75 y.o.  Admit date: 03/01/2017 Discharge date: 03/08/2017  Admission Diagnoses:Pleural effusion  Discharge Diagnoses:  Active Problems:   Pleural effusion  Patient Active Problem List   Diagnosis Date Noted  . History of endovascular stent graft for abdominal aortic aneurysm (AAA) 01/02/2017  . S/P CABG (coronary artery bypass graft) 11/05/2016  . Cardiomyopathy, ischemic 11/05/2016  . Chronic combined systolic and diastolic congestive heart failure ([place]) 08/06/2016  . Sinus tachycardia 02/24/2016  . Pleural effusion   . SOB (shortness of breath)   . Acute on chronic combined systolic and diastolic heart failure ([place])   . Coronary artery disease due to lipid rich plaque   . Hypoalbuminemia due to protein-calorie malnutrition ([place]) 02/21/2016  . Fever, unspecified 02/21/2016  . Peripheral edema 02/17/2016  . Acute on chronic renal failure ([place]) 02/05/2016  . HTN (hypertension) 02/05/2016  . AAA (abdominal aortic aneurysm) without rupture ([place]) 01/30/2016  . Primary cancer of right upper lobe of lung ([place]) 11/24/2015  . CKD (chronic kidney disease) stage 3, GFR 30-59 ml/min 11/11/2015  . Thrombocytopenia ([place]) 02/17/2015  . Chronic obstructive airway disease with asthma ([place]) 02/17/2015  . Other and unspecified angina pectoris 03/18/2014  . 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 Right carotid bruit 07/21/2013  . CAD (coronary artery disease) 01/19/2013  . AAA (abdominal aortic aneurysm) ([place]) 05/06/2012  . Cerebrovascular disease 12/18/2011  . GERD (gastroesophageal reflux disease)   . Arteriosclerotic cardiovascular disease (ASCVD) 12/06/2009  . Elevated lipids 12/05/2009  . Essential hypertension 12/05/2009    HPI:  The patient is a 76 year old gentleman with a history of hypertension, hyperlipidemia, COPD, remote smoking and CAD s/p CABG by me in 1996 with subsequent  stenting of the distal LM and SVG to the diagonal a few years ago. 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 He was treated with concurrent chemoradiation. He has developed shortness of breath and cough with a recurrent right pleural effusion. He underwent thoracentesis on 06/22/2017 removing 1.2 L of serous fluid and a thoracentesis on 02/11/2017 removing only 280 cc of dark, bloody fluid. Cytology on both specimens was negative for malignancy. He has been treated with 2 courses of antibiotics by Dr. Lenna Gilford as well as a tapering dose of prednisone.  He is doing fairly well overall but still have some shortness of breath, right back pain and a cough productive of clear mucous.   The patient was admitted for video-assisted thoracoscopy, bronchoscopy and talc  Discharged Condition: good  Hospital Course: The patient was admitted electively and taken to the operating room where he underwent the below described procedure. He tolerated well and was taken to the postanesthesia care unit in stable condition  Postoperative hospital course:  Patient has overall done well. He has had significant pleural drainage for the first 3 postoperative days but it is decreasing over time. The plan is for tube removal when it is less than 200 cc per 24-hour period. Chest x-ray is stable in appearance. The pathology is noted below. Incision is healing without evidence of infection. He developed some bronchitis with green sputum.  Sputum culture is showing GNR and GP organisms.  He will be placed on Levaquin for this. He is tolerating ambulation without significant dyspnea on exertion. Oxygen has been weaned and he maintains excellent saturations on room air. At time  of discharge he is felt to be quite stable.  Consults: None  Significant Diagnostic Studies: chest xray/labs- routine  Treatments: surgery:  CARDIOTHORACIC SURGERY  OPERATIVE NOTE:  [name] [id number] 03/01/2017   Preoperative Dx:  [name] s/p chemo/XRT with recurrent right pleural effusion  Postoperative Dx: Same   Procedure:   Flexible bronchoscopy with biopsy of RML  Right video-assisted thoracoscopy with drainage of loculated pleural effusion, biopsy of pleura and talc pleurodesis.  Surgeon: Dr. Gaye Pollack   Assistant: Nicholes Rough, PA-C  Anesthesia: GET    Disposition: 01-Home or Self Care     Pathology:AL DIAGNOSIS Diagnosis 1. Lung, biopsy, Right middle lobe - LUNG TISSUE WITH REACTIVE CHANGES AND CHRONIC INFLAMMATION - NO MALIGNANCY IDENTIFIED 2. Pleura, biopsy, Right - FIBROUS TISSUE WITH CHRONIC INFLAMMATION - NO MALIGNANCY IDENTIFIED Microscopic Comment 1. Dr. Lyndon Code reviewed both parts of this case and agrees with the above diagnoses. Thressa Sheller MD Pathologist, Electronic Signature (Case signed 03/04/2017)    Allergies as of 03/08/2017      Reactions   Neomycin Hives      Medication List    TAKE these medications   acetaminophen 500 MG tablet Commonly known as:  TYLENOL Take 500 mg by mouth every 6 (six) hours as needed for mild pain.   aspirin EC 81 MG tablet Take 81 mg by mouth daily.   budesonide-formoterol 160-4.5 MCG/ACT inhaler Commonly known as:  SYMBICORT Inhale 2 puffs into the lungs 2 (two) times daily.   CENTRUM SILVER ADULT 50+ Tabs Take 1 tablet by mouth daily.   clopidogrel 75 MG tablet Commonly known as:  PLAVIX TAKE ONE TABLET BY MOUTH DAILY.   furosemide 40 MG tablet Commonly known as:  LASIX Take 1 tablet (40 mg total) by mouth daily.   Iron 28 MG Tabs Take 28 mg by mouth daily.   levofloxacin 750 MG tablet Commonly known as:  LEVAQUIN Take 1 tablet (750 mg total) by mouth daily.   Metoprolol Tartrate 75 MG Tabs Take 1 tablet by mouth 2 (two) times daily.   nitroGLYCERIN 0.4 MG SL tablet Commonly known as:  NITROSTAT Place 1 tablet (0.4  mg total) under the tongue every 5 (five) minutes as needed for chest pain.   omeprazole 20 MG capsule Commonly known as:  PRILOSEC TAKE ONE CAPSULE BY MOUTH DAILY.   ondansetron 4 MG disintegrating tablet Commonly known as:  ZOFRAN-ODT Take 4 mg by mouth every 8 (eight) hours as needed for nausea or vomiting.   simvastatin 80 MG tablet Commonly known as:  ZOCOR Take 0.5 tablets (40 mg total) by mouth at bedtime.   VENTOLIN HFA 108 (90 Base) MCG/ACT inhaler Generic drug:  albuterol INHALE 2 PUFFS BY MOUTH EVERY 4 TO 6 HOURS AS NEEDED FOR WHEEZING.   albuterol (2.5 MG/3ML) 0.083% nebulizer solution Commonly known as:  PROVENTIL Take 3 mLs (2.5 mg total) by nebulization 3 (three) times daily.      Follow-up Information    Gaye Pollack, MD Follow up.   Specialty:  Cardiothoracic Surgery Why:  Appointment with the nurse for suture removal on July 17 at 10 AM. Appointment to see Dr. Cyndia Bent on July 25 at 9:30 AM. Please obtain a chest x-ray [facility] imaging on July 25 at 9 AM. Is located in the same office complex. Contact information: [address] [place] [address] [phone]           Signed: Ellwood Handler 03/08/2017, 9:57 AM

## 2017-03-05 NOTE — Progress Notes (Signed)
[address]       [place], [address]             [phone]      4 Days Post-Op Procedure(s) (LRB): FLEXIBLE BRONCHOSCOPY WITH BIOPSIES (N/A) VIDEO ASSISTED THORACOSCOPY WITH BIOPSIES (Right) DRAINAGE OF PLEURAL EFFUSION (Right) TALC PLEURADESIS (Right) Subjective: Feels well, not SOB, CT drainage 210 cc yesterday  Objective: Vital signs in last 24 hours: Temp:  [97.5 F (36.4 C)-98.7 F (37.1 C)] 97.7 F (36.5 C) (07/10 0404) Pulse Rate:  [87-114] 87 (07/10 0404) Cardiac Rhythm: Sinus tachycardia;Bundle branch block (07/10 0700) Resp:  [14-26] 14 (07/10 0404) BP: (114-124)/(54-79) 118/65 (07/10 0404) SpO2:  [93 %-99 %] 93 % (07/10 0404)  Hemodynamic parameters for last 24 hours:    Intake/Output from previous day: 07/09 0701 - 07/10 0700 In: 1560 [P.O.:1560] Out: 1030 [Urine:870; Chest Tube:160] Intake/Output this shift: No intake/output data recorded.  General appearance: alert, cooperative and no distress Heart: regular rate and rhythm and frequent extrasystole Lungs: dim in right base Abdomen: mild distension, nontender Extremities: no edema Wound: dressings CDI  Lab Results: No results for input(s): WBC, HGB, HCT, PLT in the last 72 hours. BMET: No results for input(s): NA, K, CL, CO2, GLUCOSE, BUN, CREATININE, CALCIUM in the last 72 hours.  PT/INR: No results for input(s): LABPROT, INR in the last 72 hours. ABG    Component Value Date/Time   PHART 7.480 (H) 02/28/2017 0827   HCO3 26.5 02/28/2017 0827   TCO2 29 11/11/2015 2041   O2SAT 97.1 02/28/2017 0827   CBG (last 3)   Recent Labs  03/04/17 2120  GLUCAP 112*    Meds Scheduled Meds: . aspirin EC  81 mg Oral Daily  . atorvastatin  40 mg Oral q1800  . bisacodyl  10 mg Oral Daily  . ferrous sulfate  325 mg Oral Q breakfast  . furosemide  40 mg Oral Daily  . guaiFENesin  1,200 mg Oral BID  . levalbuterol  0.63 mg Nebulization TID  . metoprolol tartrate  75 mg Oral  BID  . mometasone-formoterol  2 puff Inhalation BID  . senna-docusate  1 tablet Oral QHS   Continuous Infusions: . potassium chloride     PRN Meds:.acetaminophen **OR** acetaminophen (TYLENOL) oral liquid 160 mg/5 mL, albuterol, lactulose, ondansetron (ZOFRAN) IV, oxyCODONE, potassium chloride  Xrays Dg Chest Port 1 View  Result Date: 03/04/2017 CLINICAL DATA:  Follow-up pleural effusion EXAM: PORTABLE CHEST 1 VIEW COMPARISON:  Chest radiograph from one day prior. FINDINGS: Stable right basilar chest tube. Intact sternotomy wires. Stable cardiomediastinal silhouette with normal heart size and aortic atherosclerosis. Small right lateral basilar hydropneumothorax appears stable. No left pneumothorax. No left pleural effusion. Stable volume loss and consolidation in the medial apical right lung. Stable patchy opacity in the mid to lower right lung. IMPRESSION: 1. Right basilar chest tube with stable small right lateral basilar hydropneumothorax. 2. Stable patchy opacity in the mid to lower right lung, likely aspiration, pneumonia and/or atelectasis. 3. Stable radiation change in the medial right upper lung. Electronically Signed   By: Ilona Sorrel M.D.   On: 03/04/2017 07:28   Dg Chest Port 1 View  Result Date: 03/03/2017 CLINICAL DATA:  Pleural effusion. History of right upper lobe cancer EXAM: PORTABLE CHEST 1 VIEW COMPARISON:  Yesterday FINDINGS: Unchanged chest tube at the right base. No visible pneumothorax. Post treatment volume loss and right apical opacity. Status post CABG. Stable heart size. The left lung  is clear. IMPRESSION: 1. Stable small residual pleural fluid/thickening at the right base. No evidence of pneumothorax. 2. Stable right apical opacity, radiation fibrosis and loculated pleural fluid by 02/13/2017 CT. Electronically Signed   By: Monte Fantasia M.D.   On: 03/03/2017 09:00   Diagnosis 1. Lung, biopsy, Right middle lobe - LUNG TISSUE WITH REACTIVE CHANGES AND CHRONIC  INFLAMMATION - NO MALIGNANCY IDENTIFIED 2. Pleura, biopsy, Right - FIBROUS TISSUE WITH CHRONIC INFLAMMATION - NO MALIGNANCY IDENTIFIED Microscopic Comment 1. Dr. Lyndon Code reviewed both parts of this case and agrees with the above diagnoses. Thressa Sheller MD Pathologist, Electronic Signature (Case signed 03/04/2017) Spe   Assessment/Plan: S/P Procedure(s) (LRB): FLEXIBLE BRONCHOSCOPY WITH BIOPSIES (N/A) VIDEO ASSISTED THORACOSCOPY WITH BIOPSIES (Right) DRAINAGE OF PLEURAL EFFUSION (Right) TALC PLEURADESIS (Right)  1 doing well 2  Chest tube with small amount of air bubbles but not with each cough, 210 cc of drainage yesterday- will check CXR and poss d/c tube today 3 path noted above 4 poss home 1-2 days  LOS: 4 days    Scarlettrose Costilow E 03/05/2017

## 2017-03-05 NOTE — Discharge Instructions (Signed)
Thoracoscopy, Care After Refer to this sheet in the next few weeks. These instructions provide you with information about caring for yourself after your procedure. Your health care provider may also give you more specific instructions. Your treatment has been planned according to current medical practices, but problems sometimes occur. Call your health care provider if you have any problems or questions after your procedure. What can I expect after the procedure? After your procedure, it is common to feel sore for up to two weeks. Follow these instructions at home:  There are many different ways to close and cover an incision, including stitches (sutures), skin glue, and adhesive strips. Follow your health care provider's instructions about: ? Incision care. ? Bandage (dressing) changes and removal. ? Incision closure removal.  Check your incision area every day for signs of infection. Watch for: ? Redness, swelling, or pain. ? Fluid, blood, or pus.  Take medicines only as directed by your health care provider.  Try to cough often. Coughing helps to protect against lung infection (pneumonia). It may hurt to cough. If this happens, hold a pillow against your chest when you cough.  Take deep breaths. This also helps to protect against pneumonia.  If you were given an incentive spirometer, use it as directed by your health care provider.  Do not take baths, swim, or use a hot tub until your health care provider approves. You may take showers.  Avoid lifting until your health care provider approves.  Avoid driving until your health care provider approves.  Do not travel by airplane after the chest tube is removed until your health care provider approves. Contact a health care provider if:  You have a fever.  Pain medicines do not ease your pain.  You have redness, swelling, or increasing pain in your incision area.  You develop a cough that does not go away, or you are coughing up  mucus that is yellow or green. Get help right away if:  You have fluid, blood, or pus coming from your incision.  There is a bad smell coming from your incision or dressing.  You develop a rash.  You have difficulty breathing.  You cough up blood.  You develop light-headedness or you feel faint.  You develop chest pain.  Your heartbeat feels irregular or very fast. This information is not intended to replace advice given to you by your health care provider. Make sure you discuss any questions you have with your health care provider. Document Released: 03/02/2005 Document Revised: 04/15/2016 Document Reviewed: 04/28/2014 Elsevier Interactive Patient Education  2018 [employer]. Pleural Effusion A pleural effusion is an abnormal buildup of fluid in the layers of tissue between your lungs and the inside of your chest (pleural space). These two layers of tissue that line both your lungs and the inside of your chest are called pleura. Usually, there is no air in the space between the pleura, only a thin layer of fluid. If left untreated, a large amount of fluid can build up and cause the lung to collapse. A pleural effusion is usually caused by another disease that requires treatment. The two main types of pleural effusion are:  Transudative pleural effusion. This happens when fluid leaks into the pleural space because of a low protein count in your blood or high blood pressure in your vessels. Heart failure often causes this.  Exudative infusion. This occurs when fluid collects in the pleural space from blocked blood vessels or lymph vessels. Some lung diseases, injuries,  and cancers can cause this type of effusion.  What are the causes? Pleural effusion can be caused by:  Heart failure.  A blood clot in the lung (pulmonary embolism).  Pneumonia.  Cancer.  Liver failure (cirrhosis).  Kidney disease.  Complications from surgery, such as from open heart surgery.  What are  the signs or symptoms? In some cases, pleural effusion may cause no symptoms. Symptoms can include:  Shortness of breath, especially when lying down.  Chest pain, often worse when taking a deep breath.  Fever.  Dry cough that is lasting (chronic).  Hiccups.  Rapid breathing.  An underlying condition that is causing the pleural effusion (such as heart failure, pneumonia, blood clots, tuberculosis, or cancer) may also cause additional symptoms. How is this diagnosed? Your health care provider may suspect pleural effusion based on your symptoms and medical history. Your health care provider will also do a physical exam and a chest X-ray. If the X-ray shows there is fluid in your chest, you may need to have this fluid removed using a needle (thoracentesis) so it can be tested. You may also have:  Imaging studies of the chest, such as: ? Ultrasound. ? CT scan.  Blood tests for kidney and liver function.  How is this treated? Treatment depends on the cause of the pleural effusion. Treatment may include:  Taking antibiotic medicines to clear up an infection that is causing the pleural effusion.  Placing a tube in the chest to drain the effusion (tube thoracostomy). This procedure is often used when there is an infection in the fluid.  Surgery to remove the fibrous outer layer of tissue from the pleural space (decortication).  Thoracentesis, which can improve cough and shortness of breath.  A procedure to put medicine into the chest cavity to seal the pleural space to prevent fluid buildup (pleurodesis).  Chemotherapy and radiation therapy. These may be required in the case of cancerous (malignant) pleural effusion.  Follow these instructions at home:  Take medicines only as directed by your health care provider.  Keep track of how long you can gently exercise before you get short of breath. Try simply walking at first.  Do not use any tobacco products, including cigarettes,  chewing tobacco, or electronic cigarettes. If you need help quitting, ask your health care provider.  Keep all follow-up visits as directed by your health care provider. This is important. Contact a health care provider if:  The amount of time that you are able to exercise decreases or does not improve with time.  You have pain or signs of infection at the puncture site if you had thoracentesis. Watch for: ? Drainage. ? Redness. ? Swelling.  You have a fever. Get help right away if:  You are short of breath.  You develop chest pain.  You develop a new cough. This information is not intended to replace advice given to you by your health care provider. Make sure you discuss any questions you have with your health care provider. Document Released: 08/13/2005 Document Revised: 01/16/2016 Document Reviewed: 01/06/2014 Elsevier Interactive Patient Education  [employer].

## 2017-03-06 ENCOUNTER — Inpatient Hospital Stay (HOSPITAL_COMMUNITY): Payer: Medicare Other

## 2017-03-06 LAB — BASIC METABOLIC PANEL
ANION GAP: 9 (ref 5–15)
BUN: 16 mg/dL (ref 6–20)
CHLORIDE: 94 mmol/L — AB (ref 101–111)
CO2: 31 mmol/L (ref 22–32)
CREATININE: 1.28 mg/dL — AB (ref 0.61–1.24)
Calcium: 9 mg/dL (ref 8.9–10.3)
GFR calc non Af Amer: 52 mL/min — ABNORMAL LOW (ref >60)
Glucose, Bld: 123 mg/dL — ABNORMAL HIGH (ref 65–99)
Potassium: 3.5 mmol/L (ref 3.5–5.1)
SODIUM: 134 mmol/L — AB (ref 135–145)

## 2017-03-06 LAB — MAGNESIUM: MAGNESIUM: 2 mg/dL (ref 1.7–2.4)

## 2017-03-06 MED ORDER — POTASSIUM CHLORIDE CRYS ER 20 MEQ PO TBCR
40.0000 meq | EXTENDED_RELEASE_TABLET | Freq: Once | ORAL | Status: AC
  Administered 2017-03-06: 40 meq via ORAL
  Filled 2017-03-06: qty 2

## 2017-03-06 NOTE — Progress Notes (Addendum)
[address]       [place] [address]             [phone]      5 Days Post-Op Procedure(s) (LRB): FLEXIBLE BRONCHOSCOPY WITH BIOPSIES (N/A) VIDEO ASSISTED THORACOSCOPY WITH BIOPSIES (Right) DRAINAGE OF PLEURAL EFFUSION (Right) TALC PLEURADESIS (Right) Subjective: Feels pretty well  Objective: Vital signs in last 24 hours: Temp:  [97.6 F (36.4 C)-98.1 F (36.7 C)] 97.7 F (36.5 C) (07/11 0540) Pulse Rate:  [90-111] 90 (07/11 0540) Cardiac Rhythm: Sinus tachycardia (07/11 0703) Resp:  [16-20] 16 (07/11 0540) BP: (119-131)/(65-90) 119/65 (07/11 0540) SpO2:  [97 %-99 %] 97 % (07/11 0540)  Hemodynamic parameters for last 24 hours:    Intake/Output from previous day: 07/10 0701 - 07/11 0700 In: -  Out: 1040 [Urine:950; Chest Tube:90] Intake/Output this shift: No intake/output data recorded.  General appearance: alert, cooperative and no distress Heart: regular rate and rhythm and tachy Lungs: mildly dim in left base Abdomen: benign Extremities: no edema Wound: dressings CDI  Lab Results: No results for input(s): WBC, HGB, HCT, PLT in the last 72 hours. BMET: No results for input(s): NA, K, CL, CO2, GLUCOSE, BUN, CREATININE, CALCIUM in the last 72 hours.  PT/INR: No results for input(s): LABPROT, INR in the last 72 hours. ABG    Component Value Date/Time   PHART 7.480 (H) 02/28/2017 0827   HCO3 26.5 02/28/2017 0827   TCO2 29 11/11/2015 2041   O2SAT 97.1 02/28/2017 0827   CBG (last 3)   Recent Labs  03/04/17 2120  GLUCAP 112*    Meds Scheduled Meds: . aspirin EC  81 mg Oral Daily  . atorvastatin  40 mg Oral q1800  . bisacodyl  10 mg Oral Daily  . ferrous sulfate  325 mg Oral Q breakfast  . furosemide  40 mg Oral Daily  . guaiFENesin  1,200 mg Oral BID  . levalbuterol  0.63 mg Nebulization TID  . metoprolol tartrate  75 mg Oral BID  . mometasone-formoterol  2 puff Inhalation BID  . senna-docusate  1 tablet Oral QHS    Continuous Infusions: . potassium chloride     PRN Meds:.acetaminophen **OR** acetaminophen (TYLENOL) oral liquid 160 mg/5 mL, albuterol, lactulose, ondansetron (ZOFRAN) IV, oxyCODONE, potassium chloride  Xrays Dg Chest 2 View  Result Date: 03/05/2017 CLINICAL DATA:  Status post surgery of right upper lung. EXAM: CHEST  2 VIEW COMPARISON:  Radiograph of March 04, 2017. FINDINGS: Stable cardiomediastinal silhouette. Status post coronary artery bypass graft. Left lung is clear. Stable position of right-sided chest tube with continued presence of right basilar pneumothorax which is unchanged. Stable right upper lobe density is noted most consistent with postoperative for post radiation changes. Underlying neoplasm cannot be excluded. Stable loculated right pleural effusion is seen laterally in right lung base. Stable right basilar opacity is noted concerning for atelectasis or infiltrate. IMPRESSION: Stable position of right-sided chest tube with associated mild right basilar pneumothorax. Stable mild right loculated pleural effusion is noted. Stable right upper lobe density is noted most consistent with postoperative or post radiation changes. Stable right basilar atelectasis or infiltrate is noted. Electronically Signed   By: Marijo Conception, M.D.   On: 03/05/2017 10:58    Assessment/Plan: S/P Procedure(s) (LRB): FLEXIBLE BRONCHOSCOPY WITH BIOPSIES (N/A) VIDEO ASSISTED THORACOSCOPY WITH BIOPSIES (Right) DRAINAGE OF PLEURAL EFFUSION (Right) TALC PLEURADESIS (Right)  1 drainage conts to decrease but air leak is larger, will keep tube to  H2O seal and recheck CXR to eval for increase in stable pntx 2 sinus tachy with some pvc's - recheck labs 3 cont pulm toilet and routine rehab   LOS: 5 days    GOLD,WAYNE E 03/06/2017  I have seen and examined the patient and agree with the assessment and plan as outlined.  Leave tube in for now, although given that patient did not have lung biopsy but  rather pleural biopsies I question whether or not the perceived air leak might be due to residual space.  Nevertheless, some bubbles traverse with deep inspiration.  Rexene Alberts, MD 03/06/2017

## 2017-03-07 LAB — EXPECTORATED SPUTUM ASSESSMENT W REFEX TO RESP CULTURE

## 2017-03-07 LAB — CBC WITH DIFFERENTIAL/PLATELET
BASOS ABS: 0 10*3/uL (ref 0.0–0.1)
Basophils Relative: 0 %
EOS PCT: 0 %
Eosinophils Absolute: 0 10*3/uL (ref 0.0–0.7)
HCT: 34.5 % — ABNORMAL LOW (ref 39.0–52.0)
Hemoglobin: 10.7 g/dL — ABNORMAL LOW (ref 13.0–17.0)
LYMPHS PCT: 10 %
Lymphs Abs: 0.7 10*3/uL (ref 0.7–4.0)
MCH: 26.2 pg (ref 26.0–34.0)
MCHC: 31 g/dL (ref 30.0–36.0)
MCV: 84.6 fL (ref 78.0–100.0)
Monocytes Absolute: 0.6 10*3/uL (ref 0.1–1.0)
Monocytes Relative: 8 %
NEUTROS ABS: 6.1 10*3/uL (ref 1.7–7.7)
Neutrophils Relative %: 82 %
PLATELETS: 194 10*3/uL (ref 150–400)
RBC: 4.08 MIL/uL — AB (ref 4.22–5.81)
RDW: 17.4 % — ABNORMAL HIGH (ref 11.5–15.5)
WBC: 7.5 10*3/uL (ref 4.0–10.5)

## 2017-03-07 LAB — EXPECTORATED SPUTUM ASSESSMENT W GRAM STAIN, RFLX TO RESP C

## 2017-03-07 NOTE — Progress Notes (Signed)
Right-side chest tube removed, per MD order. Site clean and dry. Gauze dressing applied. Pt tolerated well. Pt resting in bed with call- light within reach. Will continue current plan of care.  Grant Fontana BSN, RN

## 2017-03-07 NOTE — Progress Notes (Addendum)
[address]       [place] [address]             [phone]      6 Days Post-Op Procedure(s) (LRB): FLEXIBLE BRONCHOSCOPY WITH BIOPSIES (N/A) VIDEO ASSISTED THORACOSCOPY WITH BIOPSIES (Right) DRAINAGE OF PLEURAL EFFUSION (Right) TALC PLEURADESIS (Right) Subjective: Feels pretty well, + Sputum production- yellow  Objective: Vital signs in last 24 hours: Temp:  [97.6 F (36.4 C)-98.6 F (37 C)] 97.6 F (36.4 C) (07/12 0600) Pulse Rate:  [99-107] 99 (07/12 0600) Cardiac Rhythm: Sinus tachycardia (07/11 1900) Resp:  [20] 20 (07/12 0600) BP: (105-132)/(63-80) 129/80 (07/12 0600) SpO2:  [96 %-98 %] 96 % (07/12 0600)  Hemodynamic parameters for last 24 hours:    Intake/Output from previous day: 07/11 0701 - 07/12 0700 In: -  Out: 470 [Urine:400; Chest Tube:70] Intake/Output this shift: No intake/output data recorded.  General appearance: alert, cooperative and no distress Heart: regular rate and rhythm and freq extrasystole Lungs: coarse throughout Abdomen: benign Extremities: no edema Wound: dressings CDI  Lab Results: No results for input(s): WBC, HGB, HCT, PLT in the last 72 hours. BMET:  Recent Labs  03/06/17 0921  NA 134*  K 3.5  CL 94*  CO2 31  GLUCOSE 123*  BUN 16  CREATININE 1.28*  CALCIUM 9.0    PT/INR: No results for input(s): LABPROT, INR in the last 72 hours. ABG    Component Value Date/Time   PHART 7.480 (H) 02/28/2017 0827   HCO3 26.5 02/28/2017 0827   TCO2 29 11/11/2015 2041   O2SAT 97.1 02/28/2017 0827   CBG (last 3)   Recent Labs  03/04/17 2120  GLUCAP 112*    Meds Scheduled Meds: . aspirin EC  81 mg Oral Daily  . atorvastatin  40 mg Oral q1800  . bisacodyl  10 mg Oral Daily  . ferrous sulfate  325 mg Oral Q breakfast  . furosemide  40 mg Oral Daily  . guaiFENesin  1,200 mg Oral BID  . levalbuterol  0.63 mg Nebulization TID  . metoprolol tartrate  75 mg Oral BID  . mometasone-formoterol  2 puff  Inhalation BID  . senna-docusate  1 tablet Oral QHS   Continuous Infusions: . potassium chloride     PRN Meds:.acetaminophen **OR** acetaminophen (TYLENOL) oral liquid 160 mg/5 mL, albuterol, lactulose, ondansetron (ZOFRAN) IV, oxyCODONE, potassium chloride  Xrays Dg Chest 2 View  Result Date: 03/06/2017 CLINICAL DATA:  Postoperative change on the right with chest tube in place EXAM: CHEST  2 VIEW COMPARISON:  March 05, 2017 chest radiograph ; chest CT February 13, 2017 FINDINGS: Chest tube remains present on the right inferiorly with loculated pneumothorax right base, stable. There is subcutaneous air on the right, stable. There is stable right pleural effusion with right base atelectasis/consolidation. There is asymmetric pleural thickening on the right, likely in part due to post radiation therapy change. Post radiation therapy changes also noted in the right perihilar region, unchanged. Left lung is clear. The heart size is normal. No adenopathy is appreciable by radiography. There is aortic atherosclerosis. Patient is status post coronary artery bypass grafting. There is degenerative change in the thoracic spine. IMPRESSION: Persistent loculated pneumothorax right base with chest tube present in this region. Subcutaneous air also on the right, unchanged. Persistent right pleural effusion with atelectasis/ consolidation in the right base. Post radiation therapy change more superiorly on the right medially and in the apex region. Left lung remains clear.  Cardiac silhouette is within normal limits and stable. There is aortic atherosclerosis. Overall appearance essentially stable compared to 1 day prior. Aortic Atherosclerosis (ICD10-I70.0). Electronically Signed   By: Lowella Grip III M.D.   On: 03/06/2017 08:59   Dg Chest 2 View  Result Date: 03/05/2017 CLINICAL DATA:  Status post surgery of right upper lung. EXAM: CHEST  2 VIEW COMPARISON:  Radiograph of March 04, 2017. FINDINGS: Stable  cardiomediastinal silhouette. Status post coronary artery bypass graft. Left lung is clear. Stable position of right-sided chest tube with continued presence of right basilar pneumothorax which is unchanged. Stable right upper lobe density is noted most consistent with postoperative for post radiation changes. Underlying neoplasm cannot be excluded. Stable loculated right pleural effusion is seen laterally in right lung base. Stable right basilar opacity is noted concerning for atelectasis or infiltrate. IMPRESSION: Stable position of right-sided chest tube with associated mild right basilar pneumothorax. Stable mild right loculated pleural effusion is noted. Stable right upper lobe density is noted most consistent with postoperative or post radiation changes. Stable right basilar atelectasis or infiltrate is noted. Electronically Signed   By: Marijo Conception, M.D.   On: 03/05/2017 10:58    Assessment/Plan: S/P Procedure(s) (LRB): FLEXIBLE BRONCHOSCOPY WITH BIOPSIES (N/A) VIDEO ASSISTED THORACOSCOPY WITH BIOPSIES (Right) DRAINAGE OF PLEURAL EFFUSION (Right) TALC PLEURADESIS (Right)  1 chest tube- small air leak currently with each cough, CXR yesterday was very stable in appearance cont current management, drainage is minimal- hope to d/c tube soon 2 breath sounds more coarse this am and sputum( will culture) is productive, no fevers, Will recheck WBC and cont current nebs- may need to consider abx with some degree of infiltrative pattern. 3 push rehab as able- walking well in halls   LOS: 6 days    Sandoval,Paul E 03/07/2017  I have seen and examined the patient and agree with the assessment and plan as outlined, but I don't think Paul Sandoval has an air leak, just a small pleural space with tidaling of air/fluid with cough and deep inspiration.  D/C tube today.  Rexene Alberts, MD 03/07/2017 9:46 AM

## 2017-03-08 ENCOUNTER — Inpatient Hospital Stay (HOSPITAL_COMMUNITY): Payer: Medicare Other

## 2017-03-08 MED ORDER — LEVOFLOXACIN 750 MG PO TABS: 750.0000 mg | ORAL_TABLET | Freq: Every day | ORAL | 0 refills | Status: AC

## 2017-03-08 NOTE — Progress Notes (Signed)
03/08/2017 12:20 PM Discharge AVS meds taken today and those due this evening reviewed.  Follow-up appointments and when to call md reviewed.  D/C IV and TELE.  Questions and concerns addressed.   D/C home per orders. [place]

## 2017-03-08 NOTE — Progress Notes (Signed)
[address]       [employer] [address]             [phone]      7 Days Post-Op Procedure(s) (LRB): FLEXIBLE BRONCHOSCOPY WITH BIOPSIES (N/A) VIDEO ASSISTED THORACOSCOPY WITH BIOPSIES (Right) DRAINAGE OF PLEURAL EFFUSION (Right) TALC PLEURADESIS (Right) Subjective: conts with sputum production, no fevers or elevated  WBC  Objective: Vital signs in last 24 hours: Temp:  [97.4 F (36.3 C)-98.5 F (36.9 C)] 97.5 F (36.4 C) (07/13 0430) Pulse Rate:  [86-102] 90 (07/13 0430) Cardiac Rhythm: Normal sinus rhythm (07/13 0700) Resp:  [8-27] 8 (07/13 0430) BP: (92-120)/(53-72) 114/60 (07/13 0430) SpO2:  [95 %-100 %] 95 % (07/13 0430)  Hemodynamic parameters for last 24 hours:    Intake/Output from previous day: No intake/output data recorded. Intake/Output this shift: No intake/output data recorded.  General appearance: alert, cooperative and no distress Heart: regular rate and rhythm Lungs: dim right lower fields Abdomen: benign Extremities: no edema Wound: healing well  Lab Results:  Recent Labs  03/07/17 0815  WBC 7.5  HGB 10.7*  HCT 34.5*  PLT 194   BMET:  Recent Labs  03/06/17 0921  NA 134*  K 3.5  CL 94*  CO2 31  GLUCOSE 123*  BUN 16  CREATININE 1.28*  CALCIUM 9.0    PT/INR: No results for input(s): LABPROT, INR in the last 72 hours. ABG    Component Value Date/Time   PHART 7.480 (H) 02/28/2017 0827   HCO3 26.5 02/28/2017 0827   TCO2 29 11/11/2015 2041   O2SAT 97.1 02/28/2017 0827   CBG (last 3)  No results for input(s): GLUCAP in the last 72 hours.  Meds Scheduled Meds: . aspirin EC  81 mg Oral Daily  . atorvastatin  40 mg Oral q1800  . bisacodyl  10 mg Oral Daily  . ferrous sulfate  325 mg Oral Q breakfast  . furosemide  40 mg Oral Daily  . guaiFENesin  1,200 mg Oral BID  . levalbuterol  0.63 mg Nebulization TID  . metoprolol tartrate  75 mg Oral BID  . mometasone-formoterol  2 puff Inhalation BID  .  senna-docusate  1 tablet Oral QHS   Continuous Infusions: . potassium chloride     PRN Meds:.acetaminophen **OR** acetaminophen (TYLENOL) oral liquid 160 mg/5 mL, albuterol, lactulose, ondansetron (ZOFRAN) IV, oxyCODONE, potassium chloride  Xrays Dg Chest 2 View  Result Date: 03/06/2017 CLINICAL DATA:  Postoperative change on the right with chest tube in place EXAM: CHEST  2 VIEW COMPARISON:  March 05, 2017 chest radiograph ; chest CT February 13, 2017 FINDINGS: Chest tube remains present on the right inferiorly with loculated pneumothorax right base, stable. There is subcutaneous air on the right, stable. There is stable right pleural effusion with right base atelectasis/consolidation. There is asymmetric pleural thickening on the right, likely in part due to post radiation therapy change. Post radiation therapy changes also noted in the right perihilar region, unchanged. Left lung is clear. The heart size is normal. No adenopathy is appreciable by radiography. There is aortic atherosclerosis. Patient is status post coronary artery bypass grafting. There is degenerative change in the thoracic spine. IMPRESSION: Persistent loculated pneumothorax right base with chest tube present in this region. Subcutaneous air also on the right, unchanged. Persistent right pleural effusion with atelectasis/ consolidation in the right base. Post radiation therapy change more superiorly on the right medially and in the apex region. Left lung remains clear.  Cardiac silhouette is within normal limits and stable. There is aortic atherosclerosis. Overall appearance essentially stable compared to 1 day prior. Aortic Atherosclerosis (ICD10-I70.0). Electronically Signed   By: Lowella Grip III M.D.   On: 03/06/2017 08:59    Results for orders placed or performed during the hospital encounter of 03/01/17  Culture, expectorated sputum-assessment     Status: None   Collection Time: 03/07/17  7:49 AM  Result Value Ref Range  Status   Specimen Description EXPECTORATED SPUTUM  Final   Special Requests NONE  Final   Sputum evaluation THIS SPECIMEN IS ACCEPTABLE FOR SPUTUM CULTURE  Final   Report Status 03/07/2017 FINAL  Final  Culture, respiratory (NON-Expectorated)     Status: None (Preliminary result)   Collection Time: 03/07/17  7:49 AM  Result Value Ref Range Status   Specimen Description EXPECTORATED SPUTUM  Final   Special Requests NONE Reflexed from [id number]  Final   Gram Stain   Final    ABUNDANT WBC PRESENT, PREDOMINANTLY PMN RARE SQUAMOUS EPITHELIAL CELLS PRESENT MODERATE GRAM NEGATIVE RODS RARE GRAM POSITIVE COCCI    Culture PENDING  Incomplete   Report Status PENDING  Incomplete    Assessment/Plan: S/P Procedure(s) (LRB): FLEXIBLE BRONCHOSCOPY WITH BIOPSIES (N/A) VIDEO ASSISTED THORACOSCOPY WITH BIOPSIES (Right) DRAINAGE OF PLEURAL EFFUSION (Right) TALC PLEURADESIS (Right)   1 doing well overall, CXR from this morning is pending 2 clinical bronchitis, cont pulm rx, no fevers or leukocytosis, sats good on RA,  mod gr - rods and rare gr+ cocci- may need to consider abx  3 sinus with pvc's, , BP a little low at times- monitor on current rx 4 poss home later today or tomorrow  LOS: 7 days    Camara Rosander E 03/08/2017

## 2017-03-08 NOTE — Care Management Important Message (Signed)
Important Message  Patient Details  [name] MRN: [id number] Date of Birth: 03-Sep-1940   Medicare Important Message Given:  Yes    Cylan Borum [address] 03/08/2017, 1:31 PM

## 2017-03-09 LAB — CULTURE, RESPIRATORY W GRAM STAIN

## 2017-03-09 LAB — CULTURE, RESPIRATORY

## 2017-03-11 NOTE — Consult Note (Signed)
           [facility] CM Primary Care Navigator  03/11/2017  SHERON ROBIN 09-May-1941 [id number]   Went to see patientat the bedsideto identify possible discharge needs but he was alreadydischarge per staffreport . Patient was discharged home.   Primary care provider's office called (Crystal/Amanda)to notify of patient's discharge and need for post hospital follow-up and transition of care. Notified as wellof patient's health issues needing follow-up.  Made aware to refer patient to [facility] care management ifdeemed appropriatefor services.   For questions, please contact:  Dannielle Huh, BSN, RN- [facility] Primary Care Navigator  Telephone: [phone] [place]

## 2017-03-12 ENCOUNTER — Ambulatory Visit: Payer: Medicare Other | Admitting: Pulmonary Disease

## 2017-03-12 ENCOUNTER — Telehealth: Payer: Self-pay | Admitting: Physician Assistant

## 2017-03-12 ENCOUNTER — Encounter (INDEPENDENT_AMBULATORY_CARE_PROVIDER_SITE_OTHER): Payer: Self-pay

## 2017-03-12 DIAGNOSIS — Z4802 Encounter for removal of sutures: Secondary | ICD-10-CM

## 2017-03-12 NOTE — Telephone Encounter (Signed)
Paul Sandoval was recently discharged from hospital post VATS procedure.  During stay he developed symptoms of Bronchitis.  Sputum culture was obtained and has grown Serratia Marcescens.  He was discharged on Levaquin and he was instructed to complete his 10 day course as the sensitivity results are good for Fluorquinolone.  He was agreeable to this and appreciated receiving such good care during hospitalization.

## 2017-03-13 ENCOUNTER — Ambulatory Visit: Payer: Medicare Other | Admitting: Pulmonary Disease

## 2017-03-13 ENCOUNTER — Encounter: Payer: Self-pay | Admitting: Pulmonary Disease

## 2017-03-13 VITALS — BP 116/60 | HR 92 | Temp 97.6°F | Ht 70.0 in | Wt 173.4 lb

## 2017-03-13 DIAGNOSIS — I714 Abdominal aortic aneurysm, without rupture, unspecified: Secondary | ICD-10-CM

## 2017-03-13 DIAGNOSIS — I2510 Atherosclerotic heart disease of native coronary artery without angina pectoris: Secondary | ICD-10-CM

## 2017-03-13 DIAGNOSIS — N183 Chronic kidney disease, stage 3 unspecified: Secondary | ICD-10-CM

## 2017-03-13 DIAGNOSIS — Z95828 Presence of other vascular implants and grafts: Secondary | ICD-10-CM | POA: Diagnosis not present

## 2017-03-13 DIAGNOSIS — I255 Ischemic cardiomyopathy: Secondary | ICD-10-CM

## 2017-03-13 DIAGNOSIS — C3411 Malignant neoplasm of upper lobe, right bronchus or lung: Secondary | ICD-10-CM

## 2017-03-13 DIAGNOSIS — I679 Cerebrovascular disease, unspecified: Secondary | ICD-10-CM | POA: Diagnosis not present

## 2017-03-13 DIAGNOSIS — J90 Pleural effusion, not elsewhere classified: Secondary | ICD-10-CM | POA: Diagnosis not present

## 2017-03-13 DIAGNOSIS — Z951 Presence of aortocoronary bypass graft: Secondary | ICD-10-CM | POA: Diagnosis not present

## 2017-03-13 MED ORDER — ALBUTEROL SULFATE (2.5 MG/3ML) 0.083% IN NEBU: 2.5000 mg | INHALATION_SOLUTION | Freq: Three times a day (TID) | RESPIRATORY_TRACT | 11 refills | Status: DC

## 2017-03-13 NOTE — Progress Notes (Signed)
Subjective:     Patient ID: MIKAELE Sandoval, male   DOB: 08/23/1941, 76 y.o.   MRN: [id number]  HPI  ~  February 29, 2016:  [place] w/ SN>  Paul Sandoval is a 76 y/o WF w/ mult medical problems as noted below who has seen DrAlva & DrMannam previously;  I have reviewed his extensive epic records and recent [place] x2 in June2017 and formulated the following PROBLEM LIST>>  His PCP is DrLuking in [place]... He reminded me that I cared for his dad in 61...    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    ~  [place]>  Fever, cough, increased RUL opac (PCT<0.10, he received 10d Levaquin) & right>left effusion w/ thoracentesis 02/22/16 removing 1200cc clear yellow fluid, prob transudate, & BNP was >900; meds adjusted & he was diuresed w/ improvement but 2DEcho revealed worsening CHF w/ EF ~30-35% + HK & AK => he has f/u pending w/ Cards...    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    ASPVD- s/p AAA stent graft 11/2012, mod bilat carotid occlusive dis (asymptomatic) w/ CDopplers showing ~50-60% bilat ICAstenoses>  [facility]- plans yearly f/u CT Abd and CDopplers    MEDICAL issues>  HBP, HL, GERD/ Divertic/ Polyps, CKD-stage3, Anemia & thrombocytopenia>  On Simva80-1/2Qd, Prilosec20/d, Fe/ MVI/ etc... Since he was disch 02/25/16>  He reports feeling better, breathing better, still sl SOB w/ activ but not requiring O2, mild cough w/ beige sput, no hemoptysis, denies CP=> he is due for f/u CXR & blood work post hosp... EXAM shows Afeb, VSS, O2sat=95% on RA after walking back; Wt=180#;  HEENT- neg, mallampati2;  Chest- sl decr BS right base, few rhonchi, w/o w/r/consolidation;  Heart- RR Gr1/6SEM w/o r/g;  Abd- soft, neg;  Ext- neg w/o c/c/e...  PFT 02/01/16>  FVC=1.96 (45%), FEV1=1.25 (40%), %1sec=64, mid-flows were reduced at 28% predicted; post bronchodil there was a 31% improvement in FEV1 to 1.64;  TLC=5.96 (83%, RV=3.89 (  149%), RV/TLC=65%;  DLCO=58% pred.  This is c/w moderate to severe airflow obstruction, GOLD Stage 3 COPD w/ a signif asthmatic component, air trapping, and decr diffusion capacity...  2DEcho 02/22/16>  Mild conc LVH w/ decr LVF & EF=30-35% w/ diffuse HK & AK in several walls (see report), Gr2 DD, mild MR otherw norm valves, mild LAdil (69m), norm RV function & PA pressures  LABS 01/2016 Hosp>  Chems- ok x Cr=1.3-1.4, BS=120-140, Alb=2.8;  CBC- anemic w/ Hg=8-9 range, Plat~130K range;  TSH=0.92  CT Chest 02/21/16>  Norm heart size, extensive coronary calcif, s/p CABG, decr size of the pathological  right paratracheal LN, decr size of RUL mass (now ~2cm & prev ~4cm), new confluent airsp opac w/ air bronchograms in RUL w/ large right effusion; s/p GB, +HH, Abd Ao stent graft, DJD & DISH w/o metastatic lesions  CT Angio Chest 02/24/16>  NEG for PE, s/pCABG & stent in Lmain, norm heart size, no pericard fluid, small right pleural effusion, architectural distortion & interstitial opacities in RUL c/w XRT changes, RUL nodule measures ~2cm, no adenopathy reported...   CXR 02/29/16>  Stable heart size, tortuous Ao, s/p CABG, sl improvement in interstitial opac in RUL area & posteromedial RUL nodule  LABS 02/29/16>  Chems- ok x HCO3=36, BS=120, BUN/Cr=30/1.40;  CBC- Hg=10.9, Plat=249K, WBC=18.2 (on Pred);  BNP=1053;  Sed=53 IMP/PLAN>>  Problem list as above w/ signif cardiac, pulmonary, & post chemoradiation changes;  From the pulm standpoint- he feels the BREO is helping & hasn't needed the rescue inhaler very often, continue same;  He is on a long PRED taper per DrManning for poss radiation fibrosis RUL, Sed rate is 53, continue this taper & careful w/ sweets/ sugar etc;  From the cardiac standpoint- he has signif underlying dis w/ Lmain stent, s/p CABG 20 yrs ago, prob ischemic cardiomyopathy w/ worsening 2DEcho recently w/ 30-35% EF + HK&AK seen, BNP=1053, Cards plans ischemic work up when able, in the meanwhile he has improved w/ diuresis but Chems w/ mild RI & HCO3=36... REC- add DIAMOX-ER 500 one tab daily at 4pm, continue his Lasix40 Qam, increase water intake, watch weights... We plan ROV [id number]...  ~  April 04, 2016:  3m10moV w/ SN>> BilCaisenlled the Oncology symptom management clinic 8/4 w/ intermittent cough, wheezing, fever; they did a f/u CXR interpreted as showing a right base opac suspicious for acute pneumonia but this is a soft finding on my review of the XRay, and the prev RUL reticulonod opac is improved (radiation fibrosis improved w/ Pred taper); placed on Levaquin Rx;  He denies sput  production, hemoptysis, CP, or SOB but he is fairly sedentary.    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    ~  [place]>  Fever, cough, increased RUL opac (PCT<0.10, he received 10d Levaquin) & right>left effusion w/ thoracentesis 02/22/16 removing 1200cc clear yellow fluid, prob transudate, & BNP was >900; meds adjusted & he was diuresed w/ improvement but 2DEcho revealed worsening CHF w/ EF ~30-35% + HK & AK => he has f/u pending w/ Cards...    ~  [facility]  He had right pleural effusion tapped 02/22/16=> Prob TRANSUDATE w/ TProt<3.0, LDH=95, Cytology=NEG (reactive mesothelial cells only);  Cults=> no growth    ~  03/2016 developed cough/ wheezing/ low grade fever=> CXR w/ improved RUL, ?incr markings R base, he was off the Pred, given more Levaquin...     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 pt was [facility]uresed & they plan an outpt ischemic eval due to decr EF & wall motion abn..    ~  He saw CARDS PA 03/01/16> no change in meds, Lexiscan Myoview 03/06/16 showed hi risk study w/ EF=30%, no ST segm changes, c/w antero-apical MI & no evid reversible ischemia...    ASPVD- s/p AAA stent graft 11/2012, mod bilat carotid occlusive dis (asymptomatic) w/ CDopplers showing ~50-60% bilat ICAstenoses>  [facility] plans yearly f/u CT Abd and CDopplers    MEDICAL issues>  HBP, HL, GERD/ Divertic/ Polyps, CKD-stage3, Anemia & thrombocytopenia>  On Simva80-1/2Qd, Prilosec20/d, Fe/ MVI/ etc... EXAM shows Afeb, VSS, O2sat=99% on RA; Wt=178#;  HEENT- neg, mallampati2;  Chest- sl decr BS right base, few rhonchi, w/o w/r/consolidation;  Heart- RR Gr1/6SEM w/o r/g;  Abd- soft, neg;  Ext- neg w/o c/c/e...  Lexiscan Myoview 03/06/16 showed hi risk study w/ EF=30%, no ST segm changes, c/w antero-apical MI & no evid reversible ischemia...  CXR 03/30/16>  incr patchy opac at the right base, no effusion, RUL reticulonodular opac in RUL has regressed; norm heart size s/p CABG, abd Ao endograft part vis  LABS 03/28/16>  Chems- ok w/ K=4.0, HCO3=23, Cr=1.6, BS=145;  CBC- ok w/ Hg=11.4, wbc=6.1, plat=85K;   IMP/PLAN>>  Discussed w/ Rito-- continue the Levaquin til gone, use Tylenol for fever or pain; continue the Breo daily & Ventolin-HFA as needed; wean off the Diamox at this time, continue Lasix40, no salt, etc; he needs to gradually increase his exercise/ mobility; we will plan ROV w/ labs in 245mo.  ~  June 06, 2016:  48m16moV w/ SN>  BilDaemionports that he is feeling better, gaining strength, walking 48mi75m mowing yard, and resting well at night;  He notes min hacking cough, no sput- no discoloration or blood, SOB is diminished & he denies CP, no f/c/s... He saw DrMohamed 05/16/16- pt remains on observation (s/p chemoradiation, 5 cycles w/ partial  response); repeat CT Chest 05/10/16 showed more confluent airsp dis in RUL w/ vol loss, abn soft tissue attenuation in right hilum has progressed (?felt to be treatment related), stable subcarinal LN ~11mm5mderate right effusion... DrMohamed felt this scan showed no concerning features of dis progression & they opted for continued observ  w/ f/u 50mo.. 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    ~  [place] 01/2016>  Fever, cough, increased RUL opac (PCT<0.10, he received 10d Levaquin) & right>left effusion w/ thoracentesis 02/22/16 removing 1200cc clear yellow fluid, prob transudate, & BNP was >900; meds adjusted & he was diuresed w/ improvement but 2DEcho revealed worsening CHF w/ EF ~30-35% + HK & AK => he has f/u pending w/ Cards...    ~  [facility]  He had right pleural effusion tapped 02/22/16=> Prob TRANSUDATE w/ TProt<3.0, LDH=95, Cytology=NEG (reactive mesothelial cells only);  Cults=> no growth    ~  03/2016 developed cough/ wheezing/ low grade fever=> CXR w/ improved RUL, ?incr markings R base,  he was off the Pred, given more Levaquin...     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    ~  He saw CARDS PA 03/01/16> no change in meds, Lexiscan Myoview 03/06/16 showed hi risk study w/ EF=30%, no ST segm changes, c/w antero-apical MI & no evid reversible ischemia...    ASPVD- s/p AAA stent graft 11/2012, mod bilat carotid occlusive dis (asymptomatic) w/ CDopplers showing ~50-60% bilat ICAstenoses>  [facility]- plans yearly f/u CT Abd and CDopplers    MEDICAL issues>  HBP, HL, GERD/ Divertic/ Polyps, CKD-stage3, Anemia & thrombocytopenia>  On Simva80-1/2Qd, Prilosec20/d, Fe/ MVI etc... EXAM shows Afeb, VSS, O2sat=95% on RA; Wt=183# (up 5#);  HEENT- neg, mallampati2;  Chest- sl decr BS right base, few rhonchi, w/o w/r/consolidation;  Heart- RR Gr1/6SEM w/o r/g;  Abd- soft, neg;  Ext- neg w/o c/c/e...  CT Chest 05/10/16 showed more confluent airsp dis in RUL w/ vol loss, abn soft tissue attenuation in right hilum has progressed (?felt to be treatment related), stable subcarinal LN ~1161mmoderate right effusion.  CXR 06/06/16> progressive incr density in RUL & vol loss, soft tissue fullness in right hilum, s/p CABG and calcif in wall of Ao arch  PET scan 06/15/16> persistent but diminished hypermetabolism in the RUL area of interstitial & airsp opac-  likely related to XRT, no hypermetabolism in the prev R paratrachial LN, similar sized subcarinal LN w/ low level hypermetabolism noted; new area of LUL anterior  interstitial & airsp dis w/ some bronchiectasis shows low level hypermetabolism (?radiation change?); moderate right effusion...  IMP/PLAN>>  He is feeling better, performance status improving, CXR shows progressive changes in RUL s/p chemoradiation; CT=>PET scans w/ vol loss & persistent hypermetabolism/ evolving changes in RUL & right hilum plus he has a mod large right effusion; prev thoracentesis 01/2016 in hosp was transudative- I believe he would benefit from further eval/ repeat thoracentesis to recheck this fluid; we will set this up via IR & ask them to drain the fluid as much as poss, send it for Cytology, cell ct & diff, TProt/ LDH/ Gluc...  ~  August 06, 2016:  27moROV w/ SN>  BBrasonreports that his SOB & energy have improved, appetite is better; he is more active walking 292m5d/wk + yard work/ mowing/ leaves/ etc; he notes min cough, small amt beige sput, stable DOE, no CP or edema... He tells me that DrMohamed has CT Chest & LABS ordered for next week so we will wait for these tests & review when avail...     COPD (Stage3 w/ signif revers component), former smoker (quit 1996 w/ ~40+ pack-yr hx)>  On Symbicort160-2spBid regularly ([facility] changed [facility]), VentolinHFA rescue inhaler as needed...    He saw DrCherly Hensenor CARDS 07/04/16>  HBP, CAD- s/p CABG 1996 & subseq PCI to graft vessels, chr combined sys&diast CHF w/ cardiomyopathy, PVD- w/ carotid dis & Ao stent graft per drLawson, HL, CKD;  No changes made to his ASA/ Plavix, Metoprolol50-1.5Bid, Lasix40, Simva40...     EXAM shows Afeb, VSS, O2sat=98% on RA; Wt=187# (up 4#);  HEENT- neg, mallampati2;  Chest- sl decr BS right base, few rhonchi, w/o w/r/consolidation;  Heart- RR Gr1/6SEM w/o r/g;  Abd- soft, neg;  Ext- neg w/o c/c/e...  Throacentesis 06/22/17 by IR>  1.2L removed (hazy yellow fluid)=> Cell ct=923 cells w/ 2 Neutro/ 58 lymph/ 40 mono-macrophages;  Chems- LDH=119, TPro=4.1, Gluc=101;  Cyto= Atypic cells c/w reactive mesothel cells.  Post  thoracentesis CXR 06/22/17>  decr right effusion, no pneumoth, dense consolid RUL unchanged, post-op BABG...  CT Chest 08/14/16>  Norm heart size- s/p CABG & Ao atherosclerotic changes; stable upper lim adenopathy; decr right effusion- now small in size; similar interstitial & airsp dis in right perihilar & medial LUL is c/w radiation fibrosis; no signif pulm nodules or masses- no new or progressive dis...   LABS 08/14/17>  Chems- ok x Cr=1.6;  CBC- ok w/ Hg=12.7...Marland KitchenMarland KitchenMP/PLAN>>  BiCalums clinically stable, no clear evid of recurrent dis & followed very closely by DrMohamed; Pulm stable on Symbicort Bid, Albut rescue prn, and his exercise- continue same & we plan rov recheck in 48m51mo  ~  November 05, 2016:  48mo80mo & Malik indicates "stronger, better" on diet & exercise program w/ good energy (eg- climbed steps at a ball game);  He denies much cough (clear throat), small amt clear sput, no blood, SOB w/ exertion only- ADLs ok & DOE stable, no CP & he describes 2mi 56mk 5d/wk & he stays busy...  [facility] ([facility] subcarinal LN, scarring & radiation fibrosis, ?sl incr right effusion)... 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    ~  [place] in 01/2016>  Fever, cough, increased RUL opac (PCT<0.10, he received 10d Levaquin) & right>left effusion w/ thoracentesis 02/22/16 removing 1200cc clear yellow fluid, prob transudate, & BNP was >900; meds adjusted & he was diuresed w/ improvement but 2DEcho revealed worsening CHF w/ EF ~30-35% + HK & AK => he has f/u pending w/ Cards...    ~  [facility]  He had right pleural effusion tapped 02/22/16=> Prob TRANSUDATE w/ TProt<3.0, LDH=95, Cytology=NEG (reactive mesothelial cells only);  Cults=> no growth    ~  03/2016 developed cough/ wheezing/ low grade fever=> CXR w/ improved RUL, ?incr markings R base, he was off the Pred, given more Levaquin...     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    ~  He saw CARDS PA 03/01/16> no change in meds, Lexiscan Myoview 03/06/16 showed hi risk study w/ EF=30%, no ST segm changes, c/w antero-apical MI & no evid reversible ischemia...    ASPVD- s/p AAA stent graft 11/2012, mod bilat carotid occlusive dis (asymptomatic) w/ CDopplers showing ~50-60% bilat ICAstenoses>  [facility] yearly f/u CT Abd and CDopplers    MEDICAL issues>  HBP, HL, GERD/ Divertic/ Polyps, CKD-stage3 (cr=1.6), Anemia (Hg=11.9) & thrombocytopenia (plat=171K)>  On Simva80-1/2Qd, Prilosec20/d, Fe/ MVI etc... EXAM shows Afeb, VSS, O2sat=98% on RA; Wt=189# (up 2#);  HEENT- neg,  mallampati2;  Chest- sl decr BS right base, few rhonchi, w/o w/r/consolidation;  Heart- RR Gr1/6SEM w/o r/g;  Abd- soft, neg;  Ext- neg w/o c/c/e...  CT Chest 10/30/16>  Norm heart size, s/p CABG, atherosclerosis in Ao; prev 115msubcarinal LN now measures 1378msl incr in right pleural effusion, radiation fibrosis & changes in medial right lung and ant LULare stable/ unchanged;   LABS 10/29/16>  Chems- wnl w/ Cr=1.6, LFTs= wnl;  CBC- ok w/ Hg=11.9, WBC=10.1  Ambulatory Oximetry 11/05/16>  O2sat=96% on RA at rest w/ pulse=81/min;  He ambulated 3 laps in office (185'each) w/ lowest O2sat=90% w/ pulse 99/min... IMP/PLAN>>  BilAlexio doing satis on Symbicort160-2spBid & Albut rescue inhaler prn; he states that he is active, energy is good, & that he's getting stronger &  better (good performance status); I suspect that he has residual cancer in the right chest but it appears slowly progressive (w/ sl incr right effusion & subcarinal LN) and DrMohamed to review status & decide regarding additional therapy at this point vs continued observation... we plan rov recheck in 3-641mo  NOTE:  >50% of this 25 min rov was spent in counseling & coordination of care...  ~  Jan 02, 2017:  269moOV & Juris called 4/24 c/o cough, yellow sput, low grade fever after having been Rx x2 by PCP w/ Doxy (note- Flu-B was pos in resp viral panel); we placed him on Levaquin & Medrol dosepak & he is some better- still congested and phlegm is beige but feeling better overall; we discussed addition of Mucinex 1200bid + fluids vs Guaifenesin 400-2Tid to save $$...     COPD> on Symbicort160-2spBid and Ventolin-HFA prn    Lung cancer- followed by DrMohamed & DrManning> RUL non-small cell ca IIIA, dx 3/17 & rx w/ Chemoradiation; subseq RUL opac (infection vs XRT-fibrosis & right effusion;  He saw DrMohamed 11/15/16- note reviewed & he was doing well at that time, playing golf & feeling good; CT Chest 10/30/16 w/ sl incr right effusion, sl incr  subcarinal LN from 10=>1362mradiation fibrosis areas stable; DrMohamed felt there was no clear sign of dis progression & rec f/u scans in 37mo837moe 01/2017)...    Cardiac- CAD, s/p CABG, Cardiomyopathy, CHF> on ASA/ Plavix, Metop25Bid, Lasix40; he saw DrNiCherly Hensen7/18- note reviewed; EF=30%, he mentioned more aggressive cardiac w/u & rx if cancer progrosis favorable (no changes made, f/u 34mo 9moDEcho)...    ASPVD- s/p AAA stent graft, bilat carotid dis> on ASA/ Plavix    Medical issues>  HBP, HL, GERD/ Divertic/ Polyps, CKD-stage3 (cr=1.6), Anemia (Hg=11.9) & thrombocytopenia (plat=171K)> EXAM shows Afeb, VSS, O2sat=98% on RA; Wt=183# (down 6#);  HEENT- neg, mallampati2;  Chest- sl decr BS right base, few rhonchi, w/o w/r/consolidation;  Heart- RR Gr1/6SEM w/o r/g;  Abd- soft, neg;  Ext- neg w/o c/c/e... IMP/PLAN>>  BillyDarrillmproved but it's been a slow process; he is at high risk for cancer progression & followed closely by DrMohamed who plans f/u CT & recheck in June;  Continue current meds, add Guaifenesin 2400mg/73mwater to aid in mucus production... We will recheck in 6mo & 41moew his f/u scans planned by Oncology.  ~  February 04, 2017:  41mo ROV69modd-on appt requested for cough, thick yellow sput, chest congestion w/ wheezing>  Notes low grade temp in the afternoon w/o c/s;  Denies CP/ palpit/ hemoptysis/ change in SOB/DOE; He is taking SYMBICORT160-2spBid, GFN 400-2Tid + fluids, plus rescue inhaler => we gave him a NEB Rx in office (min relief), sent sput for C&S (grew abundant NTF only); we discussed need for f/u CXR (CT due soon from Mohamed)Yolox w/ Levaquin/ Pred taper...    Non-small cell lung cancer, Stage IIIA, diagnosed 10/2015 & treated by DrMohamed & DrManning w/ Chemoradiation (carboplatin & paclitaxel)     ~  Bronch 11/17/15 by DrAlva sKallie Edwardno endobronchial lesions- TBBx were neg, but brushing & washings were pos for malig cells (felt to be c/w SqCellCa)    ~  He received ChemoRx from  DrMohamed with weekly carboplatin and paclitaxel status post 4 cycles finished 01/24/16 (dose held on several occas due to side effects)    ~  He received XRT 4/17 - 01/26/16 by DrManning to the primary tumor & involved mediastinal  adenopathy w/ 66 Gy (33 fractions of 2Gy each); he had mod esophagitis & some fatigue    Abnormal CXR w/ RUL mass, s/p treatment as above, & subseq RUL opac (?infection vs radiation fibrosis changes), right pleural effusion>  Treated w/ antibiotic Rx & PREDNISONE taper (30-25-20-15-10-5 Q5d over 49mo per DrManning...    ~  HMeridianvillex2 in 01/2016>  Fever, cough, increased RUL opac (PCT<0.10, he received 10d Levaquin) & right>left effusion w/ thoracentesis 02/22/16 removing 1200cc clear yellow fluid, prob transudate, & BNP was >900; meds adjusted & he was diuresed w/ improvement but 2DEcho revealed worsening CHF w/ EF ~30-35% + HK & AK => he has f/u pending w/ Cards...    ~  [facility]  He had right pleural effusion tapped 02/22/16=> Prob TRANSUDATE w/ TProt<3.0, LDH=95, Cytology=NEG (reactive mesothelial cells only);  Cults=> no growth    ~  03/2016 developed cough/ wheezing/ low grade fever=> CXR w/ improved RUL, ?incr markings R base, he was off the Pred, given more Levaquin...     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    ~  He saw CARDS PA 03/01/16> no change in meds, Lexiscan Myoview 03/06/16 showed hi risk study w/ EF=30%, no ST segm changes, c/w antero-apical MI & no evid reversible ischemia...    ASPVD- s/p AAA stent  graft 11/2012, mod bilat carotid occlusive dis (asymptomatic) w/ CDopplers showing ~50-60% bilat ICAstenoses>  [facility] plans yearly f/u CT Abd and CDopplers    MEDICAL issues>  HBP, HL, GERD/ Divertic/ Polyps, CKD-stage3 (cr=1.6), Anemia (Hg=11.9) & thrombocytopenia (plat=171K)>  On Simva80-1/2Qd, Prilosec20/d, Fe/ MVI etc... EXAM shows Afeb, VSS, O2sat=97% on RA; Wt=179# (down 4#);  HEENT- neg, mallampati2;  Chest- sl decr BS right base, few rhonchi, w/o w/r/consolidation;  Heart- RR Gr1/6SEM w/o r/g;  Abd- soft, neg;  Ext- neg w/o c/c/e...  CXR 02/04/17 (independently reviewed by me in the PACS system)> ?loculated mod right pleural effusion, RUL airsp dis is unchanged & c/w post XRT changes; left lung is clear, stable cardiac silhouette & prior CABG  Sputum C&S> abundant NTF, no pathogen identified... IMP/PLAN>>  We discussed the need for another thoracentesis=> check cytology;  We will Rx w/ empiric LEVAQUIN & PREDNISONE taper, plus his GFN at max doses w/ fluids;  Also awaiting f/u CT Chest due soon...  ADDENDUM>> Thoracentesis by IR 02/11/17>  280cc of dark bloody fluid removed, due to loculation no additional fluid was obtainable;  Fluid= exudative. & Cytology= NEGATIVE... ADDENDUM>> f/u CT Chest done 02/13/17>  Norm heart size, aortic atherosclerosis, prev CABG; mod loculated right pleural effusion; mass-like architectural distortion in RUL c/w XRT, new area of airsp consolidation in RML~3cm, mult faint nodular densities scattered on the right lung, borderline subcarinal node; Abd & musculoskeletal are ok... ADDENDUM>>  02/15/17>  Pt indicates that he is ?no better, c/o persistent cough, congested, thick yellow sput, no hemoptysis, low grade temps 99-100 range, denies much CP (just sore from coughing) and SOB/DOE about the same;  We discussed his limited options here & have suggested the following--    1) keep PRED 66m/d for now;  Add empiric AUGMENTIN 8765mone tab bid x10d  (plus Align/Activia);  Add NEBULIZER w/ ALBUTEROL TID regularly and space out the Symbicort Rx still at 2sp Bid...Marland KitchenMarland Kitchen  2) check 2DEcho to compare EF to 7/17 (30-35%)    3) refer to TCTS for the chr right pleural effusion to consider poss chest tube vs VATS vs decortication surg (DrBartle did his CABG yrs ago)   ~  July 18.2018:  34m27moV & post hosp visit>  BilElbys undergone an extensive work up over the last month or so w/ thoracentesis, CT Chest, TSurg consultation followed by VATS drainage & pleurosesis, and f/u 2DEcho showing worsening LVF=> referred to CHF clinic & there recommendations are pending...    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) presenting w/ RUL mass w/ mediastinal adenopathy 3/17;  He received chemoradiation w/ wkly carboplatin & paclitaxel (5 cycles w/ part response) + XRT from DrManning finished 01/26/16;  Follow up CT Chest 02/13/17 reviewed (see above), & DrMohamed felt there was no recurrence or dis progression but +for inflamm changes in RLL & loc pleural effusion; rec to re-scan in 72mo2mo We set him up to see DrBartle 02/25/17> he reiewed prev eval, scan, thoracenteses, etc; he agreed w/ performing a right VATS to drain the resid effusion 7 examine the pleural space for tumor/ infection etc followed by pleurodesis...    He was ADM 7/6 - 03/08/17 by DrBartle w/ right VATS w/ drainage of pleural effusion & Bx of pleural surface + talc pleurodesis=> all pathology was benign (fibrous tissue & chr inflamm; Disch on similar med regimen + [phone] stable since surg- +SOB/DOE w/ stairs etc, reports eating well, no CP & hasn't required pain meds; denies f/c/s, notes min cough & sm amt whitish sput- no color, no blood... Finishing Levaquin, off Pred, on NEB w/ Albut Tid, Symbicort160-2spBid, GFN400-2Tid + fluids...    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 EXAM shows Afeb, VSS, O2sat=99% on RA; Wt=174# (down 5#);  HEENT- neg, mallampati2;  Chest- sl decr BS right base, few rhonchi, w/o w/r/consolidation;  Heart- RR Gr1/6SEM w/o r/g;  Abd- soft, neg;  Ext- neg w/o c/c/e...  2DEcho done 02/26/17 showed mod dil LV & severely reduced LVF w/ EF down to 20-25%, Gr1DD, mod calcif AoV leaflets, MV was OK, RV mod dilated & sys function mildly reduced, no PAsys  reported...  IMP/PLAN>>  The worsening LVF w/ EF 28-41% is certainly playing a roll in his unrespolved dyspnea=> refer to CHF clinic for active management; in the interim continue NEBs, Symbicort, Mucinex, etc...     Past Medical History:  Diagnosis Date  . AAA (abdominal aortic aneurysm) ([place]) 2010   4.4 cm 08/2008;4.44 in 7/10 and 4.65 in 08/2009; 4.8 by CT in 11/2009; 4.3 by ultrasound in 08/2010  . Anemia   . Arteriosclerotic cardiovascular disease (ASCVD) 1996   CABG-1996  . Arthritis    "fingers" (03/18/2014)  . CAD (coronary artery disease)    03/18/14:  PCI with DES to distal left main. 7/29: DES  to the SVG to Diag  . Cancer ([place])    Upper right lobe lung cancer  . Cardiomyopathy, ischemic    Echo 03/17/14: EF 45-50%  . Chronic bronchitis ([place])   . Chronic kidney disease    CRF  . Chronic rhinitis   . Colonic polyp 2002   polypectomy in 2002  . COPD (chronic obstructive pulmonary disease) ([place])   . Diverticulosis   . Dyspnea    with exertion  . ED (erectile dysfunction)   . Encounter for antineoplastic chemotherapy 12/19/2015  . GERD (gastroesophageal reflux disease)   . History of blood transfusion   . Hyperlipidemia   . Hypertension   . IFG (impaired fasting glucose)   . Myocardial infarction [facility])    "told h/o silent MI sometime before 1996"  . Pneumonia ~ 2001; ~ 2005    has had more than twice  . Right bundle branch block   . Tobacco abuse, in remission    40 pack year total consumption; discontinued in 1996    Past Surgical History:  Procedure Laterality Date  . ABDOMINAL AORTIC ANEURYSM REPAIR  11/2012  . ABDOMINAL AORTIC ENDOVASCULAR STENT GRAFT N/A 12/11/2012   Procedure: ABDOMINAL AORTIC ENDOVASCULAR STENT GRAFT;  Surgeon: Mal Misty, MD;  Location: [place];  Service: Vascular;  Laterality: N/A;  Ultrasound guided; [place]  . CARDIAC CATHETERIZATION  01/08/1995  . COLONOSCOPY  2002   polypectomy-patient denies  . CORONARY ANGIOPLASTY WITH STENT PLACEMENT  03/18/2014   "1"  . CORONARY ANGIOPLASTY WITH STENT PLACEMENT  03/24/2014   "1"  . CORONARY ARTERY BYPASS GRAFT  01/09/1995   "CABG X3"  . FLEXIBLE BRONCHOSCOPY N/A 03/01/2017   Procedure: FLEXIBLE BRONCHOSCOPY WITH BIOPSIES;  Surgeon: Gaye Pollack, MD;  Location: [facility] OR;  Service: Thoracic;  Laterality: N/A;  . JOINT REPLACEMENT    . LAPAROSCOPIC CHOLECYSTECTOMY  12/2009  . LEFT AND RIGHT HEART CATHETERIZATION WITH CORONARY/GRAFT ANGIOGRAM N/A 03/18/2014   Procedure: LEFT AND RIGHT HEART CATHETERIZATION WITH Beatrix Fetters;  Surgeon: Blane Ohara, MD;  Location: [facility] CATH LAB;  Service:  Cardiovascular;  Laterality: N/A;  . PERCUTANEOUS CORONARY STENT INTERVENTION (PCI-S)  03/18/2014   Procedure: PERCUTANEOUS CORONARY STENT INTERVENTION (PCI-S);  Surgeon: Blane Ohara, MD;  Location: [facility] CATH LAB;  Service: Cardiovascular;;  . PERCUTANEOUS CORONARY STENT INTERVENTION (PCI-S) N/A 03/24/2014   Procedure: PERCUTANEOUS CORONARY STENT INTERVENTION (PCI-S);  Surgeon: Blane Ohara, MD;  Location: [facility] CATH LAB;  Service: Cardiovascular;  Laterality: N/A;  . PLEURAL EFFUSION DRAINAGE Right 03/01/2017   Procedure: DRAINAGE OF PLEURAL EFFUSION;  Surgeon: Gaye Pollack, MD;  Location: [place];  Service: Thoracic;  Laterality: Right;  . TALC PLEURODESIS Right 03/01/2017   Procedure: Pietro Cassis;  Surgeon: Gaye Pollack, MD;  Location: [place];  Service: Thoracic;  Laterality:  Right;  Marland Kitchen TOTAL HIP ARTHROPLASTY Left 01/21/2013   Procedure: TOTAL HIP ARTHROPLASTY ANTERIOR APPROACH;  Surgeon: Mauri Pole, MD;  Location: [place];  Service: Orthopedics;  Laterality: Left;  Marland Kitchen VIDEO ASSISTED THORACOSCOPY Right 03/01/2017   Procedure: VIDEO ASSISTED THORACOSCOPY WITH BIOPSIES;  Surgeon: Gaye Pollack, MD;  Location: [place];  Service: Thoracic;  Laterality: Right;  Marland Kitchen VIDEO BRONCHOSCOPY N/A 11/17/2015   Procedure: VIDEO BRONCHOSCOPY WITH FLUORO;  Surgeon: Rigoberto Noel, MD;  Location: [place];  Service: Cardiopulmonary;  Laterality: N/A;    Outpatient Encounter Prescriptions as of 03/13/2017  Medication Sig  . acetaminophen (TYLENOL) 500 MG tablet Take 500 mg by mouth every 6 (six) hours as needed for mild pain.  Marland Kitchen albuterol (PROVENTIL) (2.5 MG/3ML) 0.083% nebulizer solution Take 3 mLs (2.5 mg total) by nebulization 3 (three) times daily.  Marland Kitchen aspirin EC 81 MG tablet Take 81 mg by mouth daily.  . budesonide-formoterol (SYMBICORT) 160-4.5 MCG/ACT inhaler Inhale 2 puffs into the lungs 2 (two) times daily.  . clopidogrel (PLAVIX) 75 MG tablet TAKE ONE TABLET BY MOUTH DAILY.  Marland Kitchen Ferrous Sulfate  (IRON) 28 MG TABS Take 28 mg by mouth daily.  . furosemide (LASIX) 40 MG tablet Take 1 tablet (40 mg total) by mouth daily.  Marland Kitchen levofloxacin (LEVAQUIN) 750 MG tablet Take 1 tablet (750 mg total) by mouth daily.  . Metoprolol Tartrate 75 MG TABS Take 1 tablet by mouth 2 (two) times daily.  . Multiple Vitamins-Minerals (CENTRUM SILVER ADULT 50+) TABS Take 1 tablet by mouth daily.   Marland Kitchen omeprazole (PRILOSEC) 20 MG capsule TAKE ONE CAPSULE BY MOUTH DAILY.  . simvastatin (ZOCOR) 80 MG tablet Take 0.5 tablets (40 mg total) by mouth at bedtime.  . VENTOLIN HFA 108 (90 Base) MCG/ACT inhaler INHALE 2 PUFFS BY MOUTH EVERY 4 TO 6 HOURS AS NEEDED FOR WHEEZING.  . [DISCONTINUED] albuterol (PROVENTIL) (2.5 MG/3ML) 0.083% nebulizer solution Take 3 mLs (2.5 mg total) by nebulization 3 (three) times daily.  . nitroGLYCERIN (NITROSTAT) 0.4 MG SL tablet Place 1 tablet (0.4 mg total) under the tongue every 5 (five) minutes as needed for chest pain. (Patient not taking: Reported on 03/13/2017)  . ondansetron (ZOFRAN-ODT) 4 MG disintegrating tablet Take 4 mg by mouth every 8 (eight) hours as needed for nausea or vomiting.    No facility-administered encounter medications on file as of 03/13/2017.     Allergies  Allergen Reactions  . Neomycin Hives    Immunization History  Administered Date(s) Administered  . H1N1 08/06/2008  . Influenza Split 06/24/2013  . Influenza,inj,Quad PF,36+ Mos 06/15/2014, 05/31/2015, 05/09/2016  . Influenza-Unspecified 05/27/2012  . Pneumococcal Conjugate-13 08/18/2014  . Pneumococcal Polysaccharide-23 03/27/2012  . Zoster 07/27/2008    Current Medications, Allergies, Past Medical History, Past Surgical History, Family History, and Social History were reviewed in [employer] record.   Review of Systems             All symptoms NEG except where BOLDED >>  Constitutional:  F/C/S, fatigue, anorexia, unexpected weight change. HEENT:  HA, visual changes, hearing  loss, earache, nasal symptoms, sore throat, mouth sores, hoarseness. Resp:  cough, sputum, hemoptysis; SOB, tightness, wheezing. Cardio:  CP, palpit, DOE, orthopnea, edema. GI:  N/V/D/C, blood in stool; reflux, abd pain, distention, gas. GU:  dysuria, freq, urgency, hematuria, flank pain, voiding difficulty. MS:  joint pain, swelling, tenderness, decr ROM; neck pain, back pain, etc. Neuro:  HA, tremors, seizures, dizziness, syncope, weakness, numbness, gait abn.  Skin:  suspicious lesions or skin rash. Heme:  adenopathy, bruising, bleeding. Psyche:  confusion, agitation, sleep disturbance, hallucinations, anxiety, depression suicidal.   Objective:   Physical Exam       Vital Signs:  Reviewed...   General:  WD, WN, 76 y/o WM in NAD; alert & oriented; pleasant & cooperative... HEENT:  [place]/AT; Conjunctiva- pink, Sclera- nonicteric, EOM-wnl, PERRLA, EACs-clear, TMs-wnl; NOSE-clear; THROAT-clear & wnl.  Neck:  Supple w/ fair ROM; no JVD; normal carotid impulses w/ faint bruits; no thyromegaly or nodules palpated; no lymphadenopathy.  Chest:  decr BS right base & clear w/o w/r/r & no signs of consolid. Heart:  Regular Rhythm; norm S1 & S2 w/ Gr1/6 SEM without rubs or gallops detected. Abdomen:  Soft & nontender- no guarding or rebound; normal bowel sounds; no organomegaly or masses palpated. Ext:  Sl decr ROM; without deformities +arthritic changes; no varicose veins, +venous insuffic, tr edema;  Pulses decr w/o bruits. Neuro:  CNs II-XII intact; motor testing normal; sensory testing normal; gait normal & balance OK. Derm:  No lesions noted; no rash etc. Lymph:  No cervical, supraclavicular, axillary, or inguinal adenopathy palpated.   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     ASPVD- s/p AAA stent graft 11/2012, mod bilat carotid occlusive dis (asymptomatic) w/ CDopplers showing ~50-60% bilat ICAstenoses>  Stable & [facility]/2016- plans yearly f/u CT Abd and CDopplers    MEDICAL issues>  HBP, HL, GERD/ Divertic/ Polyps, CKD-stage3, Anemia & thrombocytopenia>  On Simva80-1/2Qd, Prilosec20/d, Fe/ MVI/ etc...  PLAN>>  02/29/16>   Problem list as above w/ signif cardiac, pulmonary, & post chemoradiation changes;  From the pulm standpoint- he feels the BREO is helping & hasn't needed the rescue inhaler very often, continue same;  He is on a long PRED taper per DrManning for poss radiation fibrosis RUL, Sed rate is 53, continue this taper & careful w/ sweets/ sugar etc;  From the cardiac standpoint- he has signif underlying dis w/ Lmain stent, s/p CABG 20 yrs ago, prob ischemic cardiomyopathy w/ worsening 2DEcho recently w/ 30-35% EF + HK&AK seen, BNP=1053, Cards plans ischemic work up when able, in the meanwhile he has improved w/ diuresis but Chems w/ mild RI & HCO3=36... REC- add DIAMOX-ER 500 one tab daily at 4pm, continue his Lasix40 Qam, increase water intake, watch weights... We plan ROV 79monthrecheck... 04/04/16>   Discussed w/ Davy-- continue the Levaquin til gone, use Tylenol for fever or pain; continue the Breo daily & Ventolin-HFA as needed; wean off the Diamox at this time, continue Lasix40, no salt, etc; he needs to gradually increase his exercise/ mobility; we will plan ROV w/ labs in 246mo 06/06/16>   He is feeling better, performance status improving, CXR  shows progressive changes in RUL s/p chemoradiation; CT=>PET scans w/ vol loss & persistent hypermetabolism/ evolving  changes in RUL & right hilum plus he has a mod large right effusion; prev thoracentesis 01/2016 in hosp was transudative- I believe he would benefit from further eval/ repeat thoracentesis to recheck this fluid; we will set this up via IR & ask them to drain the fluid as much as poss, send it for Cytology, cell ct & diff, TProt/ LDH/ Gluc => 1.2L removed, prob exudate, NEG cytology, & we will follow... 08/06/16>   Elchanan is clinically stable, no clear evid of recurrent dis & followed very closely by DrMohamed; Pulm stable on Symbicort Bid, Albut rescue prn, and his exercise- continue same 7 we plan rov recheck in 8mo3/12/18>    BBrisonis doing satis on Symbicort160-2spBid & Albut rescue inhaler prn; he states that he is active, energy is good, & that he's getting stronger & better (good performance status); I suspect that he has residual cancer in the right chest but it appears slowly progressive (w/ sl incr right effusion & subcarinal LN) and DrMohamed to review status & decide regarding additional therapy at this point vs continued observation...  01/02/17>   BNazireis improved but it's been a slow process; he is at high risk for cancer progression & followed closely by DrMohamed who plans f/u CT & recheck in June;  Continue current meds, add Guaifenesin 24047md + water to aid in mucus production... We will recheck in 30m16moreview his f/u scans planned by Oncology. 02/04/17>   SEE ABOVE - Rx Pred taper & Levaquin=> no better; R-Thoracentesis by IR w/ only 280cc exudative loculated fluid removed, cytology- NEG; Limited options at this point- Rx w/ Augmentin empirically, add NEBs w/ Albuterol Tid, continue GFN 2400m21m  We will check 2DEcho and refer to TCTS to consider further Rx options for right effusion... 03/13/17>   The worsening LVF w/ EF 20-276-28%certainly playing a roll in his unrespolved  dyspnea=> refer to CHF clinic for active management; in the interim continue NEBs, Symbicort, Mucinex, etc..   Plan:     Patient's Medications  New Prescriptions   No medications on file  Previous Medications   ACETAMINOPHEN (TYLENOL) 500 MG TABLET    Take 500 mg by mouth every 6 (six) hours as needed for mild pain.   ASPIRIN EC 81 MG TABLET    Take 81 mg by mouth daily.   BUDESONIDE-FORMOTEROL (SYMBICORT) 160-4.5 MCG/ACT INHALER    Inhale 2 puffs into the lungs 2 (two) times daily.   CLOPIDOGREL (PLAVIX) 75 MG TABLET    TAKE ONE TABLET BY MOUTH DAILY.   FERROUS SULFATE (IRON) 28 MG TABS    Take 28 mg by mouth daily.   FUROSEMIDE (LASIX) 40 MG TABLET    Take 1 tablet (40 mg total) by mouth daily.   LEVOFLOXACIN (LEVAQUIN) 750 MG TABLET    Take 1 tablet (750 mg total) by mouth daily.   METOPROLOL TARTRATE 75 MG TABS    Take 1 tablet by mouth 2 (two) times daily.   MULTIPLE VITAMINS-MINERALS (CENTRUM SILVER ADULT 50+) TABS    Take 1 tablet by mouth daily.    NITROGLYCERIN (NITROSTAT) 0.4 MG SL TABLET    Place 1 tablet (0.4 mg total) under the tongue every 5 (five) minutes as needed for chest pain.   OMEPRAZOLE (PRILOSEC) 20 MG CAPSULE    TAKE ONE CAPSULE BY MOUTH DAILY.   ONDANSETRON (ZOFRAN-ODT) 4 MG DISINTEGRATING TABLET    Take 4 mg by mouth every 8 (eight) hours as needed for  nausea or vomiting.    SIMVASTATIN (ZOCOR) 80 MG TABLET    Take 0.5 tablets (40 mg total) by mouth at bedtime.   VENTOLIN HFA 108 (90 BASE) MCG/ACT INHALER    INHALE 2 PUFFS BY MOUTH EVERY 4 TO 6 HOURS AS NEEDED FOR WHEEZING.  Modified Medications   Modified Medication Previous Medication   ALBUTEROL (PROVENTIL) (2.5 MG/3ML) 0.083% NEBULIZER SOLUTION albuterol (PROVENTIL) (2.5 MG/3ML) 0.083% nebulizer solution      Take 3 mLs (2.5 mg total) by nebulization 3 (three) times daily.    Take 3 mLs (2.5 mg total) by nebulization 3 (three) times daily.  Discontinued Medications   No medications on file

## 2017-03-13 NOTE — Patient Instructions (Signed)
Today we updated your med list in our EPIC system...    Continue your current medications the same...  We refilled your ALBUTEROL for the NEBULIZER to use 3 times daily...  We will also arrange for an appt at [facility] for further help w/ your cardiomyopathy...  Keep your follow up appts w/ DrBartle until he releases you...  Call for any questions...  Let's plan a follow up visit in 6-8 weeks, sooner if needed for problems.Marland KitchenMarland Kitchen

## 2017-03-14 ENCOUNTER — Encounter: Payer: Self-pay | Admitting: Family Medicine

## 2017-03-14 ENCOUNTER — Ambulatory Visit (INDEPENDENT_AMBULATORY_CARE_PROVIDER_SITE_OTHER): Payer: Medicare Other | Admitting: Family Medicine

## 2017-03-14 VITALS — BP 108/60 | Ht 70.0 in | Wt 173.5 lb

## 2017-03-14 DIAGNOSIS — I2510 Atherosclerotic heart disease of native coronary artery without angina pectoris: Secondary | ICD-10-CM | POA: Diagnosis not present

## 2017-03-14 DIAGNOSIS — I10 Essential (primary) hypertension: Secondary | ICD-10-CM

## 2017-03-14 DIAGNOSIS — J449 Chronic obstructive pulmonary disease, unspecified: Secondary | ICD-10-CM

## 2017-03-14 DIAGNOSIS — I5043 Acute on chronic combined systolic (congestive) and diastolic (congestive) heart failure: Secondary | ICD-10-CM

## 2017-03-14 NOTE — Progress Notes (Signed)
   Subjective:    Patient ID: Paul Sandoval, male    DOB: 1940/09/05, 76 y.o.   MRN: [id number] Patient was contacted within 2 business days of discharge by the transitional team. He was automatically scheduled an appointment with me. HPI Patient in today for a hospitalization follow up for pleural effusion on right. Patient was seen at [facility]. Next  Patient reports breathing somewhat easier since the operation. No residual pain to speak of. Still has a slight sore at the site of the tube insertion.  Wheezing overall has improved. Now on a nebulizer notes some improvement compared to regular.  Trying to work on his nutrition. Drinking supplements along with maintaining regular food. No recent substantial weight loss.  Overall diminished muscular strength. Participating in regular exercise within his limitations.  Had culture done of his tube. This revealed Serratia. Patient Levaquin which should cover.  All reports reviewed and discharge summary and hospitalization notes reviewed in presence of patient  Patient states no other concerns this visit.   Review of Systems No headache, no major weight loss or weight gain, no chest pain no back pain abdominal pain no change in bowel habits complete ROS otherwise negative     Objective:   Physical Exam Alert active somewhat subdued no acute distress HEENT normal lungs minimal wheeze anterior chest no tachypnea no inspiratory crackles heart regular in rhythm blood pressure excellent on repeat abdomen soft wound site healing well       Assessment & Plan:  Impression 1 status post thoracotomy and drainage of loculation. See pulmonary notes. Overall handling it well #2 lung carcinoma ongoing. Substantial discussion with patient about ongoing preparations for this both for him and his family #3 hypertension good control #4 antibiotic coverage of specific bacteria isolated good #5 nutrition discussed encourage #6 pulmonary questions answered  regarding proper use of inhaler follow-up regular appointment.

## 2017-03-15 ENCOUNTER — Telehealth: Payer: Self-pay | Admitting: Family Medicine

## 2017-03-15 MED ORDER — MAGIC MOUTHWASH: 15.0000 mL | Freq: Four times a day (QID) | ORAL | 0 refills | Status: DC

## 2017-03-15 NOTE — Telephone Encounter (Addendum)
Patient said his tongue is burning.  He said it is beginning to have a white cover over it.  He wants to make sure it is not the Symbicort that is causing this and wants Dr. Jeannine Kitten opinion.  [employer]

## 2017-03-15 NOTE — Telephone Encounter (Signed)
Prescription printed and faxed to pharmacy 

## 2017-03-15 NOTE — Telephone Encounter (Signed)
symbicort might add to this but symbicort likely helping his breathing, rinse mouth after symbicort, dukes m  m wasj 16 one tablespoon qid

## 2017-03-15 NOTE — Telephone Encounter (Signed)
Patient notified symbicort might add to this but symbicort likely helping his breathing, rinse mouth after symbicort, dukes magic mouthwash 16 one tablespoon qid. Patient verbalized understanding. Prescription faxed to pharmacy,

## 2017-03-17 DIAGNOSIS — J449 Chronic obstructive pulmonary disease, unspecified: Secondary | ICD-10-CM | POA: Diagnosis not present

## 2017-03-19 ENCOUNTER — Other Ambulatory Visit: Payer: Self-pay | Admitting: Surgery

## 2017-03-19 DIAGNOSIS — C3490 Malignant neoplasm of unspecified part of unspecified bronchus or lung: Secondary | ICD-10-CM

## 2017-03-20 ENCOUNTER — Encounter: Payer: Self-pay | Admitting: Surgery

## 2017-03-20 ENCOUNTER — Ambulatory Visit (INDEPENDENT_AMBULATORY_CARE_PROVIDER_SITE_OTHER): Payer: Self-pay | Admitting: Surgery

## 2017-03-20 ENCOUNTER — Ambulatory Visit
Admission: RE | Admit: 2017-03-20 | Discharge: 2017-03-20 | Disposition: A | Discharge status: Home / Self Care | Payer: Medicare Other | Source: Ambulatory Visit | Attending: Surgery | Admitting: Surgery

## 2017-03-20 VITALS — BP 100/60 | HR 82 | Resp 16 | Ht 70.0 in | Wt 172.0 lb

## 2017-03-20 DIAGNOSIS — J90 Pleural effusion, not elsewhere classified: Secondary | ICD-10-CM | POA: Diagnosis not present

## 2017-03-20 DIAGNOSIS — Z85118 Personal history of other malignant neoplasm of bronchus and lung: Secondary | ICD-10-CM

## 2017-03-20 DIAGNOSIS — C3490 Malignant neoplasm of unspecified part of unspecified bronchus or lung: Secondary | ICD-10-CM

## 2017-03-20 DIAGNOSIS — Z09 Encounter for follow-up examination after completed treatment for conditions other than malignant neoplasm: Secondary | ICD-10-CM

## 2017-03-20 NOTE — Progress Notes (Signed)
HPI: Patient returns for routine postoperative follow-up having undergone bronchoscopy with biopsy and brushings of the RML and right VATS for drainage of a loculated right pleural effusion and talc pleurodesis on 03/01/2017. The patient's early postoperative recovery while in the hospital was notable for an uncomplicated postop course. Since hospital discharge the patient reports that he has been feeling well. He has mild right chest wall incisional discomfort but his breathing is better.   Current Outpatient Prescriptions  Medication Sig Dispense Refill  . acetaminophen (TYLENOL) 500 MG tablet Take 500 mg by mouth every 6 (six) hours as needed for mild pain.    Marland Kitchen albuterol (PROVENTIL) (2.5 MG/3ML) 0.083% nebulizer solution Take 3 mLs (2.5 mg total) by nebulization 3 (three) times daily. 270 mL 11  . aspirin EC 81 MG tablet Take 81 mg by mouth daily.    . budesonide-formoterol (SYMBICORT) 160-4.5 MCG/ACT inhaler Inhale 2 puffs into the lungs 2 (two) times daily.    . clopidogrel (PLAVIX) 75 MG tablet TAKE ONE TABLET BY MOUTH DAILY. 90 tablet 2  . Ferrous Sulfate (IRON) 28 MG TABS Take 28 mg by mouth daily.    . furosemide (LASIX) 40 MG tablet Take 1 tablet (40 mg total) by mouth daily. 30 tablet 11  . magic mouthwash SOLN Take 15 mLs by mouth 4 (four) times daily. Swish and spit 480 mL 0  . Metoprolol Tartrate 75 MG TABS Take 1 tablet by mouth 2 (two) times daily. 180 tablet 1  . Multiple Vitamins-Minerals (CENTRUM SILVER ADULT 50+) TABS Take 1 tablet by mouth daily.     Marland Kitchen omeprazole (PRILOSEC) 20 MG capsule TAKE ONE CAPSULE BY MOUTH DAILY. 90 capsule 1  . simvastatin (ZOCOR) 80 MG tablet Take 0.5 tablets (40 mg total) by mouth at bedtime. 15 tablet 5  . VENTOLIN HFA 108 (90 Base) MCG/ACT inhaler INHALE 2 PUFFS BY MOUTH EVERY 4 TO 6 HOURS AS NEEDED FOR WHEEZING. 18 g 0  . nitroGLYCERIN (NITROSTAT) 0.4 MG SL tablet Place 1 tablet (0.4 mg total) under the tongue every 5 (five) minutes as  needed for chest pain. (Patient not taking: Reported on 03/13/2017) 25 tablet 4   No current facility-administered medications for this visit.     Physical Exam: BP 100/60 (BP Location: Right Arm, Patient Position: Sitting, Cuff Size: Large)   Pulse 82   Resp 16   Ht 5\' 10"  (1.778 m)   Wt 172 lb (78 kg)   SpO2 98% Comment: ON RA  BMI 24.68 kg/m  He looks well Lung exam shows decreased breath sounds at the right base The incisions are healing  Diagnostic Tests:  CLINICAL DATA:  History of lung cancer.  EXAM: CHEST  2 VIEW  COMPARISON:  Chest x-ray 03/08/2017, 02/13/2017. Chest CT 02/13/2017.  FINDINGS: Stable right upper lung density with pleural thickening consistent with the patient's history of lung cancer radiation therapy. Stable right hydropneumothorax. Prior CABG. Heart size stable. No acute abnormality identified. No acute bony abnormality. Surgical clips right upper quadrant  IMPRESSION: Stable right upper lung density of pleural thickening consistent patient's history of lung cancer radiation therapy. Stable right hydropneumothorax. No acute abnormality identified. No change from prior exam .   Electronically Signed   By: Marcello Moores  Register   On: 03/20/2017 08:47   Impression:  He is doing well following surgery. The biopsies and brushings of the RML were negative for malignancy. The cytology of the right pleural fluid was negative for malignancy. The right  pleural biopsies were negative for malignancy. Most of the right lung is firmly adherent to the chest wall from his prior XRT. There is a loculated fluid collection at the apex of the chest that is sealed in by the lung and could not be drained. There was a large collection in the lower pleural space that was drained and talc pleurodesis performed. Hopefully this space will resolve over time with the talc and not refill with fluid. There is no evidence of malignancy so it is possible that this is  related to heart failure given his EF of 20-25%. He has been referred to the heart failure clinic by Dr. Lenna Gilford.  Plan:  I will see him back in two months with a CXR to follow up on the right pleural space.   Gaye Pollack, MD [facility] [phone]

## 2017-03-22 DIAGNOSIS — J449 Chronic obstructive pulmonary disease, unspecified: Secondary | ICD-10-CM | POA: Diagnosis not present

## 2017-03-25 ENCOUNTER — Other Ambulatory Visit: Payer: Self-pay | Admitting: Family Medicine

## 2017-04-14 IMAGING — DX DG CHEST 2V
2 series · 2 of 2 positions shown · non-contrast
Comparison: CT 02/24/2016.  Chest x-ray 02/23/2016.

CLINICAL DATA: Pneumonia.  Pleural effusion.

EXAM:
CHEST  2 VIEW

[chest pa]
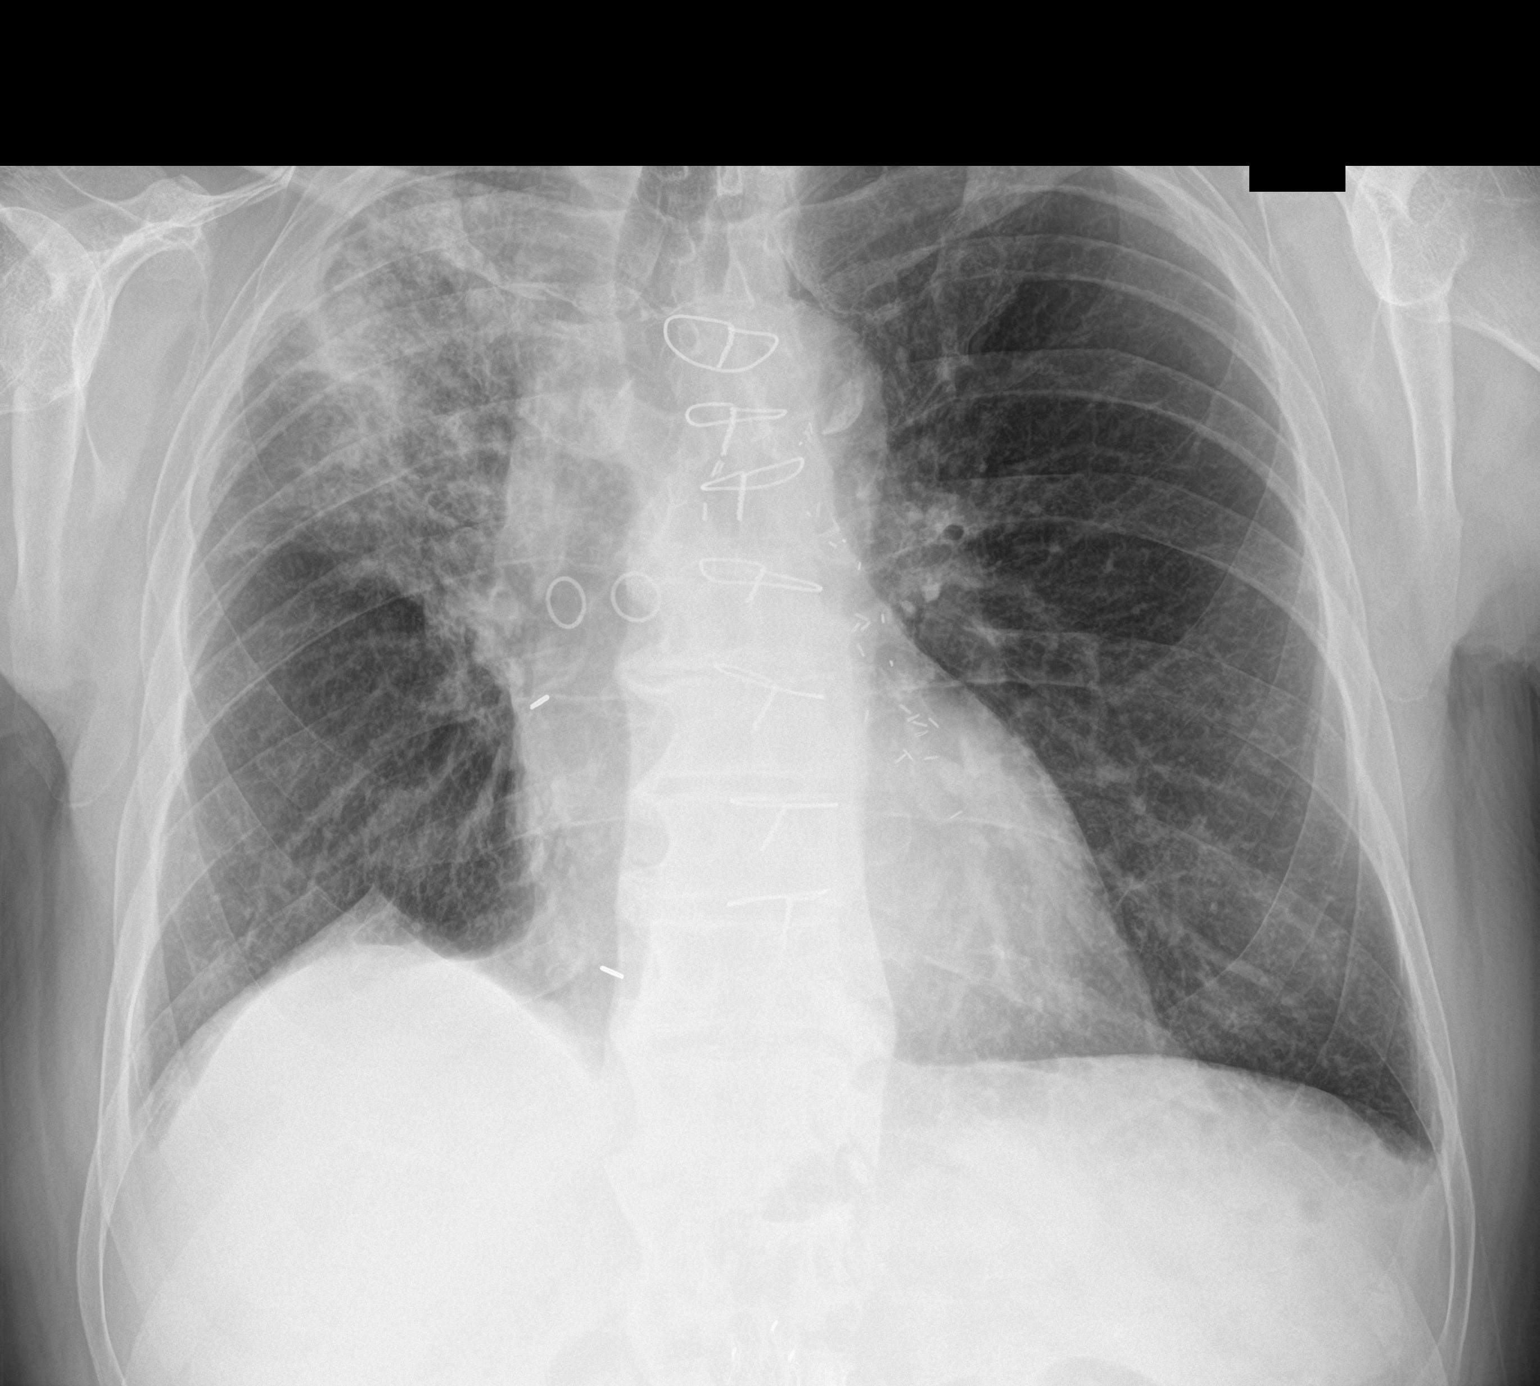

[chest lat]
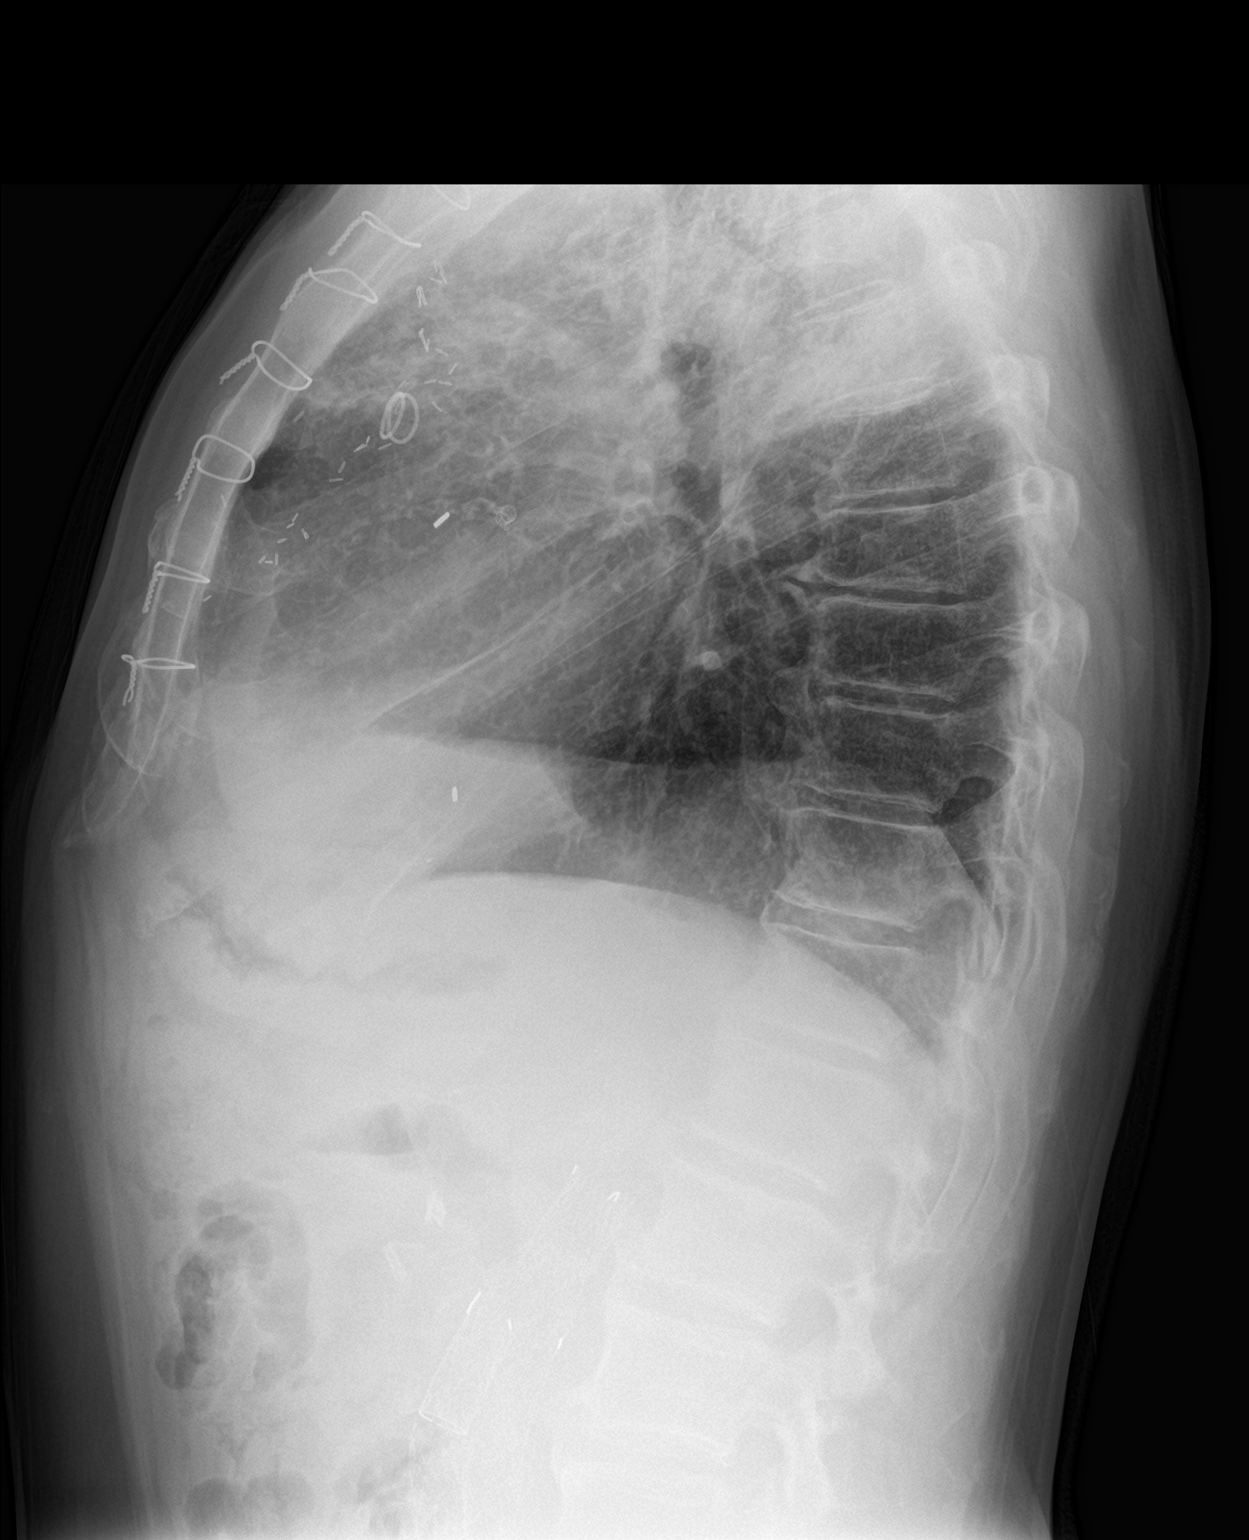

[2 of 2 positions shown; findings below may reference images not displayed]

FINDINGS: Prior CABG. Heart size stable. Stable postradiation
changes/infiltrate right upper lobe. Mild bilateral pleural
effusions cannot be excluded. No pneumothorax. Degenerative changes
thoracic spine.
IMPRESSION: 1. Stable right upper lobe postradiation changes and/or
infiltrate.Tiny bilateral pleural effusions cannot be excluded.

2. Prior CABG.  Stable cardiomegaly.

## 2017-04-17 DIAGNOSIS — J449 Chronic obstructive pulmonary disease, unspecified: Secondary | ICD-10-CM | POA: Diagnosis not present

## 2017-04-19 ENCOUNTER — Other Ambulatory Visit: Payer: Self-pay | Admitting: Cardiovascular Disease

## 2017-04-22 ENCOUNTER — Encounter (HOSPITAL_COMMUNITY): Payer: Self-pay

## 2017-04-22 ENCOUNTER — Ambulatory Visit (HOSPITAL_COMMUNITY)
Admission: RE | Admit: 2017-04-22 | Discharge: 2017-04-22 | Disposition: A | Discharge status: Home / Self Care | Payer: Medicare Other | Source: Ambulatory Visit | Attending: Cardiology | Admitting: Cardiology

## 2017-04-22 VITALS — BP 137/65 | HR 96 | Wt 177.8 lb

## 2017-04-22 DIAGNOSIS — I130 Hypertensive heart and chronic kidney disease with heart failure and stage 1 through stage 4 chronic kidney disease, or unspecified chronic kidney disease: Secondary | ICD-10-CM | POA: Insufficient documentation

## 2017-04-22 DIAGNOSIS — I5022 Chronic systolic (congestive) heart failure: Secondary | ICD-10-CM | POA: Insufficient documentation

## 2017-04-22 DIAGNOSIS — Z951 Presence of aortocoronary bypass graft: Secondary | ICD-10-CM | POA: Diagnosis not present

## 2017-04-22 DIAGNOSIS — J449 Chronic obstructive pulmonary disease, unspecified: Secondary | ICD-10-CM | POA: Insufficient documentation

## 2017-04-22 DIAGNOSIS — E785 Hyperlipidemia, unspecified: Secondary | ICD-10-CM | POA: Diagnosis not present

## 2017-04-22 DIAGNOSIS — I255 Ischemic cardiomyopathy: Secondary | ICD-10-CM | POA: Diagnosis not present

## 2017-04-22 DIAGNOSIS — Z9889 Other specified postprocedural states: Secondary | ICD-10-CM | POA: Diagnosis not present

## 2017-04-22 DIAGNOSIS — Z8249 Family history of ischemic heart disease and other diseases of the circulatory system: Secondary | ICD-10-CM | POA: Diagnosis not present

## 2017-04-22 DIAGNOSIS — Z8679 Personal history of other diseases of the circulatory system: Secondary | ICD-10-CM | POA: Insufficient documentation

## 2017-04-22 DIAGNOSIS — Z7982 Long term (current) use of aspirin: Secondary | ICD-10-CM | POA: Insufficient documentation

## 2017-04-22 DIAGNOSIS — I2582 Chronic total occlusion of coronary artery: Secondary | ICD-10-CM | POA: Diagnosis not present

## 2017-04-22 DIAGNOSIS — N183 Chronic kidney disease, stage 3 (moderate): Secondary | ICD-10-CM | POA: Insufficient documentation

## 2017-04-22 DIAGNOSIS — Z7902 Long term (current) use of antithrombotics/antiplatelets: Secondary | ICD-10-CM | POA: Diagnosis not present

## 2017-04-22 DIAGNOSIS — Z820 Family history of epilepsy and other diseases of the nervous system: Secondary | ICD-10-CM | POA: Insufficient documentation

## 2017-04-22 DIAGNOSIS — I2510 Atherosclerotic heart disease of native coronary artery without angina pectoris: Secondary | ICD-10-CM | POA: Insufficient documentation

## 2017-04-22 DIAGNOSIS — R0609 Other forms of dyspnea: Secondary | ICD-10-CM | POA: Diagnosis not present

## 2017-04-22 DIAGNOSIS — Z8261 Family history of arthritis: Secondary | ICD-10-CM | POA: Diagnosis not present

## 2017-04-22 DIAGNOSIS — Z87891 Personal history of nicotine dependence: Secondary | ICD-10-CM | POA: Insufficient documentation

## 2017-04-22 DIAGNOSIS — E784 Other hyperlipidemia: Secondary | ICD-10-CM

## 2017-04-22 DIAGNOSIS — Z801 Family history of malignant neoplasm of trachea, bronchus and lung: Secondary | ICD-10-CM | POA: Diagnosis not present

## 2017-04-22 DIAGNOSIS — I5042 Chronic combined systolic (congestive) and diastolic (congestive) heart failure: Secondary | ICD-10-CM | POA: Diagnosis not present

## 2017-04-22 DIAGNOSIS — C3490 Malignant neoplasm of unspecified part of unspecified bronchus or lung: Secondary | ICD-10-CM | POA: Diagnosis not present

## 2017-04-22 DIAGNOSIS — E7849 Other hyperlipidemia: Secondary | ICD-10-CM

## 2017-04-22 LAB — BASIC METABOLIC PANEL
ANION GAP: 13 (ref 5–15)
BUN: 14 mg/dL (ref 6–20)
CALCIUM: 9.5 mg/dL (ref 8.9–10.3)
CO2: 28 mmol/L (ref 22–32)
Chloride: 97 mmol/L — ABNORMAL LOW (ref 101–111)
Creatinine, Ser: 1.42 mg/dL — ABNORMAL HIGH (ref 0.61–1.24)
GFR, EST AFRICAN AMERICAN: 53 mL/min — AB (ref >60)
GFR, EST NON AFRICAN AMERICAN: 46 mL/min — AB (ref >60)
GLUCOSE: 96 mg/dL (ref 65–99)
Potassium: 4.2 mmol/L (ref 3.5–5.1)
Sodium: 138 mmol/L (ref 135–145)

## 2017-04-22 LAB — LIPID PANEL
CHOL/HDL RATIO: 3.2 ratio
Cholesterol: 126 mg/dL (ref 0–200)
HDL: 40 mg/dL — AB (ref >40)
LDL CALC: 68 mg/dL (ref 0–99)
TRIGLYCERIDES: 88 mg/dL (ref <150)
VLDL: 18 mg/dL (ref 0–40)

## 2017-04-22 LAB — CBC
HCT: 34.6 % — ABNORMAL LOW (ref 39.0–52.0)
HEMOGLOBIN: 10.6 g/dL — AB (ref 13.0–17.0)
MCH: 26.2 pg (ref 26.0–34.0)
MCHC: 30.6 g/dL (ref 30.0–36.0)
MCV: 85.6 fL (ref 78.0–100.0)
PLATELETS: 170 10*3/uL (ref 150–400)
RBC: 4.04 MIL/uL — AB (ref 4.22–5.81)
RDW: 17.1 % — ABNORMAL HIGH (ref 11.5–15.5)
WBC: 9.6 10*3/uL (ref 4.0–10.5)

## 2017-04-22 LAB — PROTIME-INR
INR: 1.07
PROTHROMBIN TIME: 13.9 s (ref 11.4–15.2)

## 2017-04-22 LAB — BRAIN NATRIURETIC PEPTIDE: B Natriuretic Peptide: 944.5 pg/mL — ABNORMAL HIGH (ref 0.0–100.0)

## 2017-04-22 MED ORDER — CARVEDILOL 6.25 MG PO TABS: 6.2500 mg | ORAL_TABLET | Freq: Two times a day (BID) | ORAL | 3 refills | Status: DC

## 2017-04-22 MED ORDER — SACUBITRIL-VALSARTAN 24-26 MG PO TABS: 1.0000 | ORAL_TABLET | Freq: Two times a day (BID) | ORAL | 3 refills | Status: DC

## 2017-04-22 NOTE — Patient Instructions (Addendum)
Stop Metoprolol  Start Carvedilol 6.25 mg Twice daily   Start Entresto 24/26 mg Twice daily   Labs today  Heart Catheterization on 9/14, see instruction sheet  Your physician recommends that you schedule a follow-up appointment in: 4 weeks

## 2017-04-23 ENCOUNTER — Other Ambulatory Visit (HOSPITAL_COMMUNITY): Payer: Self-pay

## 2017-04-23 ENCOUNTER — Other Ambulatory Visit: Payer: Self-pay | Admitting: Family Medicine

## 2017-04-23 ENCOUNTER — Encounter (HOSPITAL_COMMUNITY): Payer: Self-pay | Admitting: *Deleted

## 2017-04-23 DIAGNOSIS — I509 Heart failure, unspecified: Secondary | ICD-10-CM

## 2017-04-23 NOTE — Progress Notes (Signed)
PCP: Dr. Wolfgang Phoenix Pulmonary: Dr. Lenna Gilford HF Cardiology: Dr. Aundra Dubin  76 yo with history of COPD, CAD s/p CABG in 1996 and PCI to dLM and SVG-D in 7/15, ischemic cardiomyopathy, and non-small cell lung cancer presents for CHF clinic evaluation.  He was referred to [place] by Dr. Lenna Gilford after fall in EF and worsening dyspnea was noted.   Patient reports that his initial symptoms prior to CABG in 1996 were dyspnea.  He has never had any worrisome chest pain.  He did well initially after CABG and was golfing regularly and walking 2-3 miles/day up until around 3/17.  He had one episode of increased dyspnea in 2015 and had cath with PCI to distal LM and SVG-D.  In 3/17, he was diagnosed with NSCLC and was treated with radiation and chemotherapy.  Since then, he has had progressive exertional dyspnea.  He is short of breath walking up a flight of stairs.  His walk has been cut back to about 1 mile now.  He can walk about 1/2 mile then has to rest due to dyspnea.  He will then walk the 2nd mile.  Prior to 1 year ago, he could walk 3 miles without stopping.  No orthopnea/PND.  No chest pain.  He was noted on echo in 7/18 to have EF down to 20-25%.  No lightheadedness, no palpitations.    ECG (6/18, personally reviewed): NSR, RBBB  Labs (9/17): LDL 90 Labs (7/18): K 3.5, creatinine 1.28, hgb 10.7  PMH: 1. COPD: PFTs (6/17) with severe obstructive airways disease.  2. AAA: s/p repair with stent graft.  3. Non-small cell lung cancer: Stage IIIA, diagnosed 3/17.  He had radiation as well as chemotherapy with carboplatin and paclitaxel.   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 PNA in 6/17 6. Recurrent right pleural effusion: VATS with talc pleurodesis on right.  No malignant cells noted.  7. HTN 8. H/o THR 9. CAD: CABG 1996 with SVG-D, SVG-PDA, LIMA-LAD.   - LHC (7/15): Totally occluded RCA and LAD.  Severe distal LM stenosis.  Patent SVG-PDA, patent LIMA-LAD.  Severe disease SVG-D.  Patient had DES to distal left main and staged PCI of  SVG-D.   - Cardiolite (7/17): EF 30%, prior infarction with no ischemia.  10. Chronic systolic CHF: Ischemic cardiomyopathy.   - Echo (6/17): EF 30-35%. - Echo (7/18): EF 20-25%, moderate RV dilation with mildly decreased RV systolic function.   Social History   Social History  . Marital status: Married    Spouse name: N/A  . Number of children: 1  . Years of education: N/A   Occupational History  . Retired     [employer]   Social History Main Topics  . Smoking status: Former Smoker    Packs/day: 1.50    Years: 30.00    Types: Cigarettes    Start date: 12/01/1956    Quit date: 01/08/1995  . Smokeless tobacco: Never Used  . Alcohol use 0.0 oz/week     Comment: 03/18/2014 "no alacohol since 1996"  . Drug use: No  . Sexual activity: No   Other Topics Concern  . Not on file   Social History Narrative  . No narrative on file   Family History  Problem Relation Age of Onset  . Heart disease Father   . Cancer Father        Lung  . Arthritis Mother   . Parkinsonism Mother   . Arthritis Sister        Brother with  rheumatoid arthritis  . Hypertension Brother   . Colon cancer Neg Hx   . Colon polyps Neg Hx    ROS: All systems reviewed and negative except as per HPI.   Current Outpatient Prescriptions  Medication Sig Dispense Refill  . acetaminophen (TYLENOL) 500 MG tablet Take 500 mg by mouth every 6 (six) hours as needed for mild pain.    Marland Kitchen albuterol (PROVENTIL) (2.5 MG/3ML) 0.083% nebulizer solution Take 3 mLs (2.5 mg total) by nebulization 3 (three) times daily. 270 mL 11  . aspirin EC 81 MG tablet Take 81 mg by mouth daily.    . budesonide-formoterol (SYMBICORT) 160-4.5 MCG/ACT inhaler Inhale 2 puffs into the lungs 2 (two) times daily.    . clopidogrel (PLAVIX) 75 MG tablet TAKE ONE TABLET BY MOUTH DAILY. 90 tablet 2  . Ferrous Sulfate (IRON) 28 MG TABS Take 28 mg by mouth daily.    . furosemide (LASIX) 40 MG tablet TAKE ONE TABLET BY MOUTH DAILY. 30 tablet  0  . Multiple Vitamins-Minerals (CENTRUM SILVER ADULT 50+) TABS Take 1 tablet by mouth daily.     Marland Kitchen NITROSTAT 0.4 MG SL tablet PLACE 1 TAB UNDER TONGUE EVERY 5 MIN IF NEEDED FOR CHEST PAIN. MAY USE 3 TIMES.NO RELIEF CALL 911. 25 tablet 1  . omeprazole (PRILOSEC) 20 MG capsule TAKE ONE CAPSULE BY MOUTH DAILY. 90 capsule 1  . simvastatin (ZOCOR) 80 MG tablet Take 0.5 tablets (40 mg total) by mouth at bedtime. 15 tablet 5  . VENTOLIN HFA 108 (90 Base) MCG/ACT inhaler INHALE 2 PUFFS BY MOUTH EVERY 4 TO 6 HOURS AS NEEDED FOR WHEEZING. 18 g 0  . carvedilol (COREG) 6.25 MG tablet Take 1 tablet (6.25 mg total) by mouth 2 (two) times daily. 60 tablet 3  . sacubitril-valsartan (ENTRESTO) 24-26 MG Take 1 tablet by mouth 2 (two) times daily. 60 tablet 3   No current facility-administered medications for this encounter.    BP 137/65   Pulse 96   Wt 177 lb 12.8 oz (80.6 kg)   SpO2 96%   BMI 25.51 kg/m  General: NAD Neck: No JVD, no thyromegaly or thyroid nodule.  Lungs: Decreased breath sounds right base. CV: Nondisplaced PMI.  Heart regular S1/S2 (widely split S2), no S3/S4, no murmur.  No peripheral edema.  No carotid bruit.  Normal pedal pulses.  Abdomen: Soft, nontender, no hepatosplenomegaly, no distention.  Skin: Intact without lesions or rashes.  Neurologic: Alert and oriented x 3.  Psych: Normal affect. Extremities: No clubbing or cyanosis.  HEENT: Normal.   Assessment/Plan: 1. Chronic systolic CHF: Ischemic cardiomyopathy.  He has had a steady fall in EF on imaging studies, most recently had echo in 7/18 with EF 20-25%.  NYHA class III symptoms with worsened dyspnea over the last year.  On exam, he is not volume overloaded.  - Stop metoprolol tartrate.  - Start Coreg 6.25 mg bid. - Start Entresto 24/26 bid.  He appears to have enough BP room.  - Eventually would like to get him on spironolactone.  - BMET/BNP today, repeat BMET 10 days.  2. CAD: s/p CABG then PCI 7/15 to distal LM and  SVG-D.  Anginal equivalent in the past appears to have been dyspnea, he has never had significant chest pain.  Now presenting with worsening dyspnea and fall in EF.  As above, he is not volume overloaded on exam.  It appears that he has completed his lung cancer treatment regimen and is awaiting scans to reassess.   -  Continue ASA 81, Plavix, and simvastatin.  - We discussed right/left heart cath to assess for volume overload and also coronary angiography to look for progressive disease. I told him the risks/benefits of procedure and he is willing to have it done.  3. COPD: Severe COPD by PFTs in 6/17.  COPD could certainly be contributing to his exertional dyspnea.  He no longer smokes.  4. Hyperlipidemia: LDL was above goal (<70) when last checked.  Repeat today.  5. NSCLC: Follows with Dr. Earlie Server.  He has finished chemo/radiation and will get scans to assess for residual disease. He does not appear to have had any significantly cardiotoxic agents with chemotherapy.  6. Exertional dyspnea: See above.  Not obviously volume overloaded on exam but EF is lower.  Severe COPD could play a role. Also concerned for progressive coronary disease given prior exertional dyspnea as anginal equivalent.  Plan for RHC/LHC as above.   Followup in 2-3 weeks.   Loralie Champagne 04/23/2017

## 2017-04-24 ENCOUNTER — Encounter: Payer: Self-pay | Admitting: Pulmonary Disease

## 2017-04-24 ENCOUNTER — Ambulatory Visit (INDEPENDENT_AMBULATORY_CARE_PROVIDER_SITE_OTHER)
Admission: RE | Admit: 2017-04-24 | Discharge: 2017-04-24 | Disposition: A | Discharge status: Home / Self Care | Payer: Medicare Other | Source: Ambulatory Visit | Attending: Pulmonary Disease | Admitting: Pulmonary Disease

## 2017-04-24 ENCOUNTER — Ambulatory Visit (INDEPENDENT_AMBULATORY_CARE_PROVIDER_SITE_OTHER): Payer: Medicare Other | Admitting: Pulmonary Disease

## 2017-04-24 VITALS — BP 118/60 | HR 88 | Temp 97.5°F | Ht 70.0 in | Wt 178.2 lb

## 2017-04-24 DIAGNOSIS — Z951 Presence of aortocoronary bypass graft: Secondary | ICD-10-CM

## 2017-04-24 DIAGNOSIS — N183 Chronic kidney disease, stage 3 unspecified: Secondary | ICD-10-CM

## 2017-04-24 DIAGNOSIS — I255 Ischemic cardiomyopathy: Secondary | ICD-10-CM | POA: Diagnosis not present

## 2017-04-24 DIAGNOSIS — I2510 Atherosclerotic heart disease of native coronary artery without angina pectoris: Secondary | ICD-10-CM | POA: Diagnosis not present

## 2017-04-24 DIAGNOSIS — I714 Abdominal aortic aneurysm, without rupture, unspecified: Secondary | ICD-10-CM

## 2017-04-24 DIAGNOSIS — C3411 Malignant neoplasm of upper lobe, right bronchus or lung: Secondary | ICD-10-CM

## 2017-04-24 DIAGNOSIS — Z95828 Presence of other vascular implants and grafts: Secondary | ICD-10-CM | POA: Diagnosis not present

## 2017-04-24 DIAGNOSIS — J449 Chronic obstructive pulmonary disease, unspecified: Secondary | ICD-10-CM

## 2017-04-24 DIAGNOSIS — I10 Essential (primary) hypertension: Secondary | ICD-10-CM

## 2017-04-24 DIAGNOSIS — Z9889 Other specified postprocedural states: Secondary | ICD-10-CM | POA: Diagnosis not present

## 2017-04-24 DIAGNOSIS — J4489 Other specified chronic obstructive pulmonary disease: Secondary | ICD-10-CM

## 2017-04-24 DIAGNOSIS — I679 Cerebrovascular disease, unspecified: Secondary | ICD-10-CM

## 2017-04-24 DIAGNOSIS — J90 Pleural effusion, not elsewhere classified: Secondary | ICD-10-CM

## 2017-04-24 DIAGNOSIS — I5042 Chronic combined systolic (congestive) and diastolic (congestive) heart failure: Secondary | ICD-10-CM | POA: Diagnosis not present

## 2017-04-24 DIAGNOSIS — Z0000 Encounter for general adult medical examination without abnormal findings: Secondary | ICD-10-CM | POA: Diagnosis not present

## 2017-04-24 NOTE — Progress Notes (Signed)
Subjective:     Patient ID: Paul Sandoval, male   DOB: [birth year], 76 y.o.   MRN: [id number]  HPI  ~  February 29, 2016:  Paul Sandoval w/ SN>  Paul Sandoval is a 76 y/o WF w/ mult medical problems as noted below who has seen Paul Sandoval & Paul Sandoval previously;  I have reviewed his extensive epic records and recent [place] x2 in June2017 and formulated the following PROBLEM LIST>>  His PCP is Paul Sandoval in [place]... He reminded me that I cared for his dad in 65...    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    ~  HGoletax2 in 01/2016>  Fever, cough, increased RUL opac (PCT<0.10, he received 10d Levaquin) & right>left effusion w/ thoracentesis 02/22/16 removing 1200cc clear yellow fluid, prob transudate, & BNP was >900; meds adjusted & he was diuresed w/ improvement but 2DEcho revealed worsening CHF w/ EF ~30-35% + HK & AK => he has f/u pending w/ Cards...    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 pt was diuresed & they plan an outpt ischemic eval due to decr EF & wall motion abn..    ASPVD- s/p AAA stent graft 11/2012, mod bilat carotid occlusive dis (asymptomatic) w/ CDopplers showing ~50-60% bilat ICAstenoses>  [facility]- plans yearly f/u CT Abd and CDopplers    MEDICAL issues>  HBP, HL, GERD/ Divertic/ Polyps, CKD-stage3, Anemia & thrombocytopenia>  On Simva80-1/2Qd, Prilosec20/d, Fe/ MVI/ etc... Since he was disch 02/25/16>  He reports feeling better, breathing better, still sl SOB w/ activ but not requiring O2, mild cough w/ beige sput, no hemoptysis, denies CP=> he is due for f/u CXR & blood work post hosp... EXAM shows Afeb, VSS, O2sat=95% on RA after walking back; Wt=180#;  HEENT- neg, mallampati2;  Chest- sl decr BS right base, few rhonchi, w/o w/r/consolidation;  Heart- RR Gr1/6SEM w/o r/g;  Abd- soft, neg;  Ext- neg w/o c/c/e...  PFT 02/01/16>  FVC=1.96 (45%), FEV1=1.25 (40%), %1sec=64, mid-flows were reduced at 28% predicted; post bronchodil there was a 31% improvement in FEV1 to 1.64;  TLC=5.96 (83%, RV=3.89 (  149%), RV/TLC=65%;  DLCO=58% pred.  This is c/w moderate to severe airflow obstruction, GOLD Stage 3 COPD w/ a signif asthmatic component, air trapping, and decr diffusion capacity...  2DEcho 02/22/16>  Mild conc LVH w/ decr LVF & EF=30-35% w/ diffuse HK & AK in several walls (see report), Gr2 DD, mild MR otherw norm valves, mild LAdil (104m), norm RV function & PA pressures  LABS 01/2016 Hosp>  Chems- ok x Cr=1.3-1.4, BS=120-140, Alb=2.8;  CBC- anemic w/ Hg=8-9 range, Plat~130K range;  TSH=0.92  CT Chest 02/21/16>  Norm heart size, extensive coronary calcif, s/p CABG, decr size of the pathological  right paratracheal LN, decr size of RUL mass (now ~2cm & prev ~4cm), new confluent airsp opac w/ air bronchograms in RUL w/ large right effusion; s/p GB, +HH, Abd Ao stent graft, DJD & DISH w/o metastatic lesions  CT Angio Chest 02/24/16>  NEG for PE, s/pCABG & stent in L[place], norm heart size, no pericard fluid, small right pleural effusion, architectural distortion & interstitial opacities in RUL c/w XRT changes, RUL nodule measures ~2cm, no adenopathy reported...   CXR 02/29/16>  Stable heart size, tortuous Ao, s/p CABG, sl improvement in interstitial opac in RUL area & posteromedial RUL nodule  LABS 02/29/16>  Chems- ok x HCO3=36, BS=120, BUN/Cr=30/1.40;  CBC- Hg=10.9, Plat=249K, WBC=18.2 (on Pred);  BNP=1053;  Sed=53 IMP/PLAN>>  Problem list as above w/ signif cardiac, pulmonary, & post chemoradiation changes;  From the pulm standpoint- he feels the BREO is helping & hasn't needed the rescue inhaler very often, continue same;  He is on a long PRED taper per Paul Sandoval for poss radiation fibrosis RUL, Sed rate is 53, continue this taper & careful w/ sweets/ sugar etc;  From the cardiac standpoint- he has signif underlying dis w/ Lmain stent, s/p CABG 20 yrs ago, prob ischemic cardiomyopathy w/ worsening 2DEcho recently w/ 30-35% EF + HK&AK seen, BNP=1053, Cards plans ischemic work up when able, in the meanwhile he has improved w/ diuresis but Chems w/ mild RI & HCO3=36... REC- add DIAMOX-ER 500 one tab daily at 4pm, continue his Lasix40 Qam, increase water intake, watch weights... We plan ROV 118monthecheck...  ~  April 04, 2016:  35m46moV w/ SN>> BilIstvanlled the Oncology symptom management clinic 8/4 w/ intermittent cough, wheezing, fever; they did a f/u CXR interpreted as showing a right base opac suspicious for acute pneumonia but this is a soft finding on my review of the XRay, and the prev RUL reticulonod opac is improved (radiation fibrosis improved w/ Pred taper); placed on Levaquin Rx;  He denies sput  production, hemoptysis, CP, or SOB but he is fairly sedentary.    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    ~  HLattyx2 in 01/2016>  Fever, cough, increased RUL opac (PCT<0.10, he received 10d Levaquin) & right>left effusion w/ thoracentesis 02/22/16 removing 1200cc clear yellow fluid, prob transudate, & BNP was >900; meds adjusted & he was diuresed w/ improvement but 2DEcho revealed worsening CHF w/ EF ~30-35% + HK & AK => he has f/u pending w/ Cards...    ~  [facility]  He had right pleural effusion tapped 02/22/16=> Prob TRANSUDATE w/ TProt<3.0, LDH=95, Cytology=NEG (reactive mesothelial cells only);  Cults=> no growth    ~  03/2016 developed cough/ wheezing/ low grade fever=> CXR w/ improved RUL, ?incr markings R base, he was off the Pred, given more Levaquin...     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    ~  He saw CARDS PA 03/01/16> no change in meds, Lexiscan Myoview 03/06/16 showed hi risk study w/ EF=30%, no ST segm changes, c/w antero-apical MI & no evid reversible ischemia...    ASPVD- s/p AAA stent graft 11/2012, mod bilat carotid occlusive dis (asymptomatic) w/ CDopplers showing ~50-60% bilat ICAstenoses>  [facility] plans yearly f/u CT Abd and CDopplers    MEDICAL issues>  HBP, HL, GERD/ Divertic/ Polyps, CKD-stage3, Anemia & thrombocytopenia>  On Simva80-1/2Qd, Prilosec20/d, Fe/ MVI/ etc... EXAM shows Afeb, VSS, O2sat=99% on RA; Wt=178#;  HEENT- neg, mallampati2;  Chest- sl decr BS right base, few rhonchi, w/o w/r/consolidation;  Heart- RR Gr1/6SEM w/o r/g;  Abd- soft, neg;  Ext- neg w/o c/c/e...  Lexiscan Myoview 03/06/16 showed hi risk study w/ EF=30%, no ST segm changes, c/w antero-apical MI & no evid reversible ischemia...  CXR 03/30/16>  incr patchy opac at the right base, no effusion, RUL reticulonodular opac in RUL has regressed; norm heart size s/p CABG, abd Ao endograft part vis  LABS 03/28/16>  Chems- ok w/ K=4.0, HCO3=23, Cr=1.6, BS=145;  CBC- ok w/ Hg=11.4, wbc=6.1, plat=85K;   IMP/PLAN>>  Discussed w/ Paul Sandoval-- continue the Levaquin til gone, use Tylenol for fever or pain; continue the Breo daily & Ventolin-HFA as needed; wean off the Diamox at this time, continue Lasix40, no salt, etc; he needs to gradually increase his exercise/ mobility; we will plan ROV w/ labs in 2107mo.  ~  June 06, 2016:  87m787moV w/ SN>  BilAashirports that he is feeling better, gaining strength, walking 87mi53m mowing yard, and resting well at night;  He notes min hacking cough, no sput- no discoloration or blood, SOB is diminished & he denies CP, no f/c/s... He saw Paul Sandoval 05/16/16- pt remains on observation (s/p chemoradiation, 5 cycles w/ partial  response); repeat CT Chest 05/10/16 showed more confluent airsp dis in RUL w/ vol loss, abn soft tissue attenuation in right hilum has progressed (?felt to be treatment related), stable subcarinal LN ~11mm17mderate right effusion... Paul Sandoval felt this scan showed no concerning features of dis progression & they opted for continued observ  w/ f/u 50mo.. 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    ~  [place] in 01/2016>  Fever, cough, increased RUL opac (PCT<0.10, he received 10d Levaquin) & right>left effusion w/ thoracentesis 02/22/16 removing 1200cc clear yellow fluid, prob transudate, & BNP was >900; meds adjusted & he was diuresed w/ improvement but 2DEcho revealed worsening CHF w/ EF ~30-35% + HK & AK => he has f/u pending w/ Cards...    ~  [facility]  He had right pleural effusion tapped 02/22/16=> Prob TRANSUDATE w/ TProt<3.0, LDH=95, Cytology=NEG (reactive mesothelial cells only);  Cults=> no growth    ~  03/2016 developed cough/ wheezing/ low grade fever=> CXR w/ improved RUL, ?incr markings R base,  he was off the Pred, given more Levaquin...     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    ~  He saw CARDS PA 03/01/16> no change in meds, Lexiscan Myoview 03/06/16 showed hi risk study w/ EF=30%, no ST segm changes, c/w antero-apical MI & no evid reversible ischemia...    ASPVD- s/p AAA stent graft 11/2012, mod bilat carotid occlusive dis (asymptomatic) w/ CDopplers showing ~50-60% bilat ICAstenoses>  [facility]- plans yearly f/u CT Abd and CDopplers    MEDICAL issues>  HBP, HL, GERD/ Divertic/ Polyps, CKD-stage3, Anemia & thrombocytopenia>  On Simva80-1/2Qd, Prilosec20/d, Fe/ MVI etc... EXAM shows Afeb, VSS, O2sat=95% on RA; Wt=183# (up 5#);  HEENT- neg, mallampati2;  Chest- sl decr BS right base, few rhonchi, w/o w/r/consolidation;  Heart- RR Gr1/6SEM w/o r/g;  Abd- soft, neg;  Ext- neg w/o c/c/e...  CT Chest 05/10/16 showed more confluent airsp dis in RUL w/ vol loss, abn soft tissue attenuation in right hilum has progressed (?felt to be treatment related), stable subcarinal LN ~1161mmoderate right effusion.  CXR 06/06/16> progressive incr density in RUL & vol loss, soft tissue fullness in right hilum, s/p CABG and calcif in wall of Ao arch  PET scan 06/15/16> persistent but diminished hypermetabolism in the RUL area of interstitial & airsp opac-  likely related to XRT, no hypermetabolism in the prev R paratrachial LN, similar sized subcarinal LN w/ low level hypermetabolism noted; new area of LUL anterior  interstitial & airsp dis w/ some bronchiectasis shows low level hypermetabolism (?radiation change?); moderate right effusion...  IMP/PLAN>>  He is feeling better, performance status improving, CXR shows progressive changes in RUL s/p chemoradiation; CT=>PET scans w/ vol loss & persistent hypermetabolism/ evolving changes in RUL & right hilum plus he has a mod large right effusion; prev thoracentesis 01/2016 in hosp was transudative- I believe he would benefit from further eval/ repeat thoracentesis to recheck this fluid; we will set this up via IR & ask them to drain the fluid as much as poss, send it for Cytology, cell ct & diff, TProt/ LDH/ Gluc...  ~  August 06, 2016:  48moROV w/ SN>  BAciereports that his SOB & energy have improved, appetite is better; he is more active walking 265m5d/wk + yard work/ mowing/ leaves/ etc; he notes min cough, small amt beige sput, stable DOE, no CP or edema... He tells me that Paul Sandoval has CT Chest & LABS ordered for next week so we will wait for these tests & review when avail...     COPD (Stage3 w/ signif revers component), former smoker (quit 1996 w/ ~40+ pack-yr hx)>  On Symbicort160-2spBid regularly ([facility] changed [facility]), VentolinHFA rescue inhaler as needed...    He saw DrCherly Hensenor CARDS 07/04/16>  HBP, CAD- s/p CABG 1996 & subseq PCI to graft vessels, chr combined sys&diast CHF w/ cardiomyopathy, PVD- w/ carotid dis & Ao stent graft per drLawson, HL, CKD;  No changes made to his ASA/ Plavix, Metoprolol50-1.5Bid, Lasix40, Simva40...     EXAM shows Afeb, VSS, O2sat=98% on RA; Wt=187# (up 4#);  HEENT- neg, mallampati2;  Chest- sl decr BS right base, few rhonchi, w/o w/r/consolidation;  Heart- RR Gr1/6SEM w/o r/g;  Abd- soft, neg;  Ext- neg w/o c/c/e...  Throacentesis 06/22/17 by IR>  1.2L removed (hazy yellow fluid)=> Cell ct=923 cells w/ 2 Neutro/ 58 lymph/ 40 mono-macrophages;  Chems- LDH=119, TPro=4.1, Gluc=101;  Cyto= Atypic cells c/w reactive mesothel cells.  Post  thoracentesis CXR 06/22/17>  decr right effusion, no pneumoth, dense consolid RUL unchanged, post-op BABG...  CT Chest 08/14/16>  Norm heart size- s/p CABG & Ao atherosclerotic changes; stable upper lim adenopathy; decr right effusion- now small in size; similar interstitial & airsp dis in right perihilar & medial LUL is c/w radiation fibrosis; no signif pulm nodules or masses- no new or progressive dis...   LABS 08/14/17>  Chems- ok x Cr=1.6;  CBC- ok w/ Hg=12.7...Marland KitchenMarland KitchenMP/PLAN>>  BiCodas clinically stable, no clear evid of recurrent dis & followed very closely by Paul Sandoval; Pulm stable on Symbicort Bid, Albut rescue prn, and his exercise- continue same & we plan rov recheck in 75m107mo  ~  November 05, 2016:  75mo65mo & Mical indicates "stronger, better" on diet & exercise program w/ good energy (eg- climbed steps at a ball game);  He denies much cough (clear throat), small amt clear sput, no blood, SOB w/ exertion only- ADLs ok & DOE stable, no CP & he describes 2mi 71mk 5d/wk & he stays busy...  [facility] ([facility] incr subcarinal LN, scarring & radiation fibrosis, ?sl incr right effusion)... 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    ~  HBlancox2 in 01/2016>  Fever, cough, increased RUL opac (PCT<0.10, he received 10d Levaquin) & right>left effusion w/ thoracentesis 02/22/16 removing 1200cc clear yellow fluid, prob transudate, & BNP was >900; meds adjusted & he was diuresed w/ improvement but 2DEcho revealed worsening CHF w/ EF ~30-35% + HK & AK => he has f/u pending w/ Cards...    ~  [facility]  He had right pleural effusion tapped 02/22/16=> Prob TRANSUDATE w/ TProt<3.0, LDH=95, Cytology=NEG (reactive mesothelial cells only);  Cults=> no growth    ~  03/2016 developed cough/ wheezing/ low grade fever=> CXR w/ improved RUL, ?incr markings R base, he was off the Pred, given more Levaquin...     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    ~  He saw CARDS PA 03/01/16> no change in meds, Lexiscan Myoview 03/06/16 showed hi risk study w/ EF=30%, no ST segm changes, c/w antero-apical MI & no evid reversible ischemia...    ASPVD- s/p AAA stent graft 11/2012, mod bilat carotid occlusive dis (asymptomatic) w/ CDopplers showing ~50-60% bilat ICAstenoses>  [facility]- plans yearly f/u CT Abd and CDopplers    MEDICAL issues>  HBP, HL, GERD/ Divertic/ Polyps, CKD-stage3 (cr=1.6), Anemia (Hg=11.9) & thrombocytopenia (plat=171K)>  On Simva80-1/2Qd, Prilosec20/d, Fe/ MVI etc... EXAM shows Afeb, VSS, O2sat=98% on RA; Wt=189# (up 2#);  HEENT- neg,  mallampati2;  Chest- sl decr BS right base, few rhonchi, w/o w/r/consolidation;  Heart- RR Gr1/6SEM w/o r/g;  Abd- soft, neg;  Ext- neg w/o c/c/e...  CT Chest 10/30/16>  Norm heart size, s/p CABG, atherosclerosis in Ao; prev 173msubcarinal LN now measures 136msl incr in right pleural effusion, radiation fibrosis & changes in medial right lung and ant LULare stable/ unchanged;   LABS 10/29/16>  Chems- wnl w/ Cr=1.6, LFTs= wnl;  CBC- ok w/ Hg=11.9, WBC=10.1  Ambulatory Oximetry 11/05/16>  O2sat=96% on RA at rest w/ pulse=81/min;  He ambulated 3 laps in office (185'each) w/ lowest O2sat=90% w/ pulse 99/min... IMP/PLAN>>  BilJedrick doing satis on Symbicort160-2spBid & Albut rescue inhaler prn; he states that he is active, energy is good, & that he's getting stronger &  better (good performance status); I suspect that he has residual cancer in the right chest but it appears slowly progressive (w/ sl incr right effusion & subcarinal LN) and Paul Sandoval to review status & decide regarding additional therapy at this point vs continued observation... we plan rov recheck in 3-641mo  NOTE:  >50% of this 25 min rov was spent in counseling & coordination of care...  ~  Jan 02, 2017:  269moOV & Paul Sandoval called 4/24 c/o cough, yellow sput, low grade fever after having been Rx x2 by PCP w/ Doxy (note- Flu-B was pos in resp viral panel); we placed him on Levaquin & Medrol dosepak & he is some better- still congested and phlegm is beige but feeling better overall; we discussed addition of Mucinex 1200bid + fluids vs Guaifenesin 400-2Tid to save $$...     COPD> on Symbicort160-2spBid and Ventolin-HFA prn    Lung cancer- followed by Paul Sandoval & Paul Sandoval> RUL non-small cell ca IIIA, dx 3/17 & rx w/ Chemoradiation; subseq RUL opac (infection vs XRT-fibrosis & right effusion;  He saw Paul Sandoval 11/15/16- note reviewed & he was doing well at that time, playing golf & feeling good; CT Chest 10/30/16 w/ sl incr right effusion, sl incr  subcarinal LN from 10=>1362mradiation fibrosis areas stable; Paul Sandoval felt there was no clear sign of dis progression & rec f/u scans in 37mo837moe 01/2017)...    Cardiac- CAD, s/p CABG, Cardiomyopathy, CHF> on ASA/ Plavix, Metop25Bid, Lasix40; he saw DrNiCherly Hensen7/18- note reviewed; EF=30%, he mentioned more aggressive cardiac w/u & rx if cancer progrosis favorable (no changes made, f/u 34mo 9moDEcho)...    ASPVD- s/p AAA stent graft, bilat carotid dis> on ASA/ Plavix    Medical issues>  HBP, HL, GERD/ Divertic/ Polyps, CKD-stage3 (cr=1.6), Anemia (Hg=11.9) & thrombocytopenia (plat=171K)> EXAM shows Afeb, VSS, O2sat=98% on RA; Wt=183# (down 6#);  HEENT- neg, mallampati2;  Chest- sl decr BS right base, few rhonchi, w/o w/r/consolidation;  Heart- RR Gr1/6SEM w/o r/g;  Abd- soft, neg;  Ext- neg w/o c/c/e... IMP/PLAN>>  BillyDarrillmproved but it's been a slow process; he is at high risk for cancer progression & followed closely by Paul Sandoval who plans f/u CT & recheck in June;  Continue current meds, add Guaifenesin 2400mg/73mwater to aid in mucus production... We will recheck in 6mo & 41moew his f/u scans planned by Oncology.  ~  February 04, 2017:  41mo ROV69modd-on appt requested for cough, thick yellow sput, chest congestion w/ wheezing>  Notes low grade temp in the afternoon w/o c/s;  Denies CP/ palpit/ hemoptysis/ change in SOB/DOE; He is taking SYMBICORT160-2spBid, GFN 400-2Tid + fluids, plus rescue inhaler => we gave him a NEB Rx in office (min relief), sent sput for C&S (grew abundant NTF only); we discussed need for f/u CXR (CT due soon from [facility])Yolox w/ Levaquin/ Pred taper...    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    ~  HHammonx2 in 01/2016>  Fever, cough, increased RUL opac (PCT<0.10, he received 10d Levaquin) & right>left effusion w/ thoracentesis 02/22/16 removing 1200cc clear yellow fluid, prob transudate, & BNP was >900; meds adjusted & he was diuresed w/ improvement but 2DEcho revealed worsening CHF w/ EF ~30-35% + HK & AK => he has f/u pending w/ Cards...    ~  [facility]  He had right pleural effusion tapped 02/22/16=> Prob TRANSUDATE w/ TProt<3.0, LDH=95, Cytology=NEG (reactive mesothelial cells only);  Cults=> no growth    ~  03/2016 developed cough/ wheezing/ low grade fever=> CXR w/ improved RUL, ?incr markings R base, he was off the Pred, given more Levaquin...     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    ~  He saw CARDS PA 03/01/16> no change in meds, Lexiscan Myoview 03/06/16 showed hi risk study w/ EF=30%, no ST segm changes, c/w antero-apical MI & no evid reversible ischemia...    ASPVD- s/p AAA stent  graft 11/2012, mod bilat carotid occlusive dis (asymptomatic) w/ CDopplers showing ~50-60% bilat ICAstenoses>  [facility]- plans yearly f/u CT Abd and CDopplers    MEDICAL issues>  HBP, HL, GERD/ Divertic/ Polyps, CKD-stage3 (cr=1.6), Anemia (Hg=11.9) & thrombocytopenia (plat=171K)>  On Simva80-1/2Qd, Prilosec20/d, Fe/ MVI etc... EXAM shows Afeb, VSS, O2sat=97% on RA; Wt=179# (down 4#);  HEENT- neg, mallampati2;  Chest- sl decr BS right base, few rhonchi, w/o w/r/consolidation;  Heart- RR Gr1/6SEM w/o r/g;  Abd- soft, neg;  Ext- neg w/o c/c/e...  CXR 02/04/17 (independently reviewed by me in the PACS system)> ?loculated mod right pleural effusion, RUL airsp dis is unchanged & c/w post XRT changes; left lung is clear, stable cardiac silhouette & prior CABG  Sputum C&S> abundant NTF, no pathogen identified... IMP/PLAN>>  We discussed the need for another thoracentesis=> check cytology;  We will Rx w/ empiric LEVAQUIN & PREDNISONE taper, plus his GFN at max doses w/ fluids;  Also awaiting f/u CT Chest due soon...  ADDENDUM>> Thoracentesis by IR 02/11/17>  280cc of dark bloody fluid removed, due to loculation no additional fluid was obtainable;  Fluid= exudative. & Cytology= NEGATIVE... ADDENDUM>> f/u CT Chest done 02/13/17>  Norm heart size, aortic atherosclerosis, prev CABG; mod loculated right pleural effusion; mass-like architectural distortion in RUL c/w XRT, new area of airsp consolidation in RML~3cm, mult faint nodular densities scattered on the right lung, borderline subcarinal node; Abd & musculoskeletal are ok... ADDENDUM>>  02/15/17>  Pt indicates that he is ?no better, c/o persistent cough, congested, thick yellow sput, no hemoptysis, low grade temps 99-100 range, denies much CP (just sore from coughing) and SOB/DOE about the same;  We discussed his limited options here & have suggested the following--    1) keep PRED 84m/d for now;  Add empiric AUGMENTIN 879mone tab bid x10d  (plus Align/Activia);  Add NEBULIZER w/ ALBUTEROL TID regularly and space out the Symbicort Rx still at 2sp Bid...Marland KitchenMarland Kitchen  2) check 2DEcho to compare EF to 7/17 (30-35%)    3) refer to TCTS for the chr right pleural effusion to consider poss chest tube vs VATS vs decortication surg (DrBartle did his CABG yrs ago)  ~  July 18.2018:  44m66moV & post hosp visit>  BilChristians undergone an extensive work up over the last month or so w/ thoracentesis, CT Chest, TSurg consultation followed by VATS drainage & pleurosesis, and f/u 2DEcho showing worsening LVF=> referred to CHF clinic & there recommendations are pending...    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) presenting w/ RUL mass w/ mediastinal adenopathy 3/17;  He received chemoradiation w/ wkly carboplatin & paclitaxel (5 cycles w/ part response) + XRT from [facility] finished 01/26/16;  Follow up CT Chest 02/13/17 reviewed (see above), & Paul Sandoval felt there was no recurrence or dis progression but +for inflamm changes in RLL & loc pleural effusion; rec to re-scan in 77mo25mo We set him up to see DrBartle 02/25/17> he reiewed prev eval, scan, thoracenteses, etc; he agreed w/ performing a right VATS to drain the resid effusion 7 examine the pleural space for tumor/ infection etc followed by pleurodesis...    He was ADM 7/6 - 03/08/17 by DrBartle w/ right VATS w/ drainage of pleural effusion & Bx of pleural surface + talc pleurodesis=> all pathology was benign (fibrous tissue & chr inflamm; Disch on similar med regimen + [phone] stable since surg- +SOB/DOE w/ stairs etc, reports eating well, no CP & hasn't required pain meds; denies f/c/s, notes min cough & sm amt whitish sput- no color, no blood... Finishing Levaquin, off Pred, on NEB w/ Albut Tid, Symbicort160-2spBid, GFN400-2Tid + fluids...    Non-small cell lung cancer, Stage IIIA, diagnosed 10/2015 & treated by [facility]oval & [facility] w/ Chemoradiation  (carboplatin & paclitaxel) thru 01/2016>     Abnormal CXR w/ RUL mass, s/p treatment as above, & subseq RUL opac (?infection vs radiation fibrosis changes), right pleural effusion> s/p R-VATS surg & talc pleurodesis 02/2017 by DrBartle    COPD (Stage3 w/ signif revers component), former smoker (quit 1996 w/ ~40+ pack-yr hx)>  On NEB w/ Duoneb Tid + Symbicort160-2spBid regularly, GFN400-2Tid, VentolinHFA rescue inhaler as needed    CAD, s/pCABG 1996, RBBB, ischemic cardiomyopathy w/ EF=35% 01/2016 (down to 20-25% 02/2016), 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 EXAM shows Afeb, VSS, O2sat=99% on RA; Wt=174# (down 5#);  HEENT- neg, mallampati2;  Chest- sl decr BS right base, few rhonchi, w/o w/r/consolidation;  Heart- RR Gr1/6SEM w/o r/g;  Abd- soft, neg;  Ext- neg w/o c/c/e...  2DEcho done 02/26/17 showed mod dil LV & severely reduced LVF w/ EF down to 20-25%, Gr1DD, mod calcif AoV leaflets, MV was OK, RV mod dilated & sys function mildly reduced, no PAsys reported...Marland KitchenMarland Kitchen  IMP/PLAN>>  The worsening LVF w/ EF 62-83% is certainly playing a roll in his unrespolved dyspnea=> refer to CHF clinic for active management; in the interim continue NEBs, Symbicort, Mucinex, etc...    ~  April 24, 2017:  6wk [place] had CHF clinic eval by [facility] on 04/22/17-- note reviewed, Hx CAD w/ CABG in 1996 & PCI in 2015, ischemic cardiomyopathy, and c/o incr SOB & recent 2DEcho (02/2017) showed EF=20-25%;  DrMcLean adjusted his meds by changing Metoprolol to Coreg & adding Entresto, ultimately he would like to add Spironolactone; they discussed R+L heart cath soon (sched 9/14)...      [place] has had f/u appts w/ PCP-Paul Sandoval on 03/14/17 (transitional care follow up) and TCTS-DrBartle on 03/20/17>  Notes reviewed, his breathing is somewhat improved, on Levaquin for Serratia, DrBartle noted>>  Most of the right lung is firmly adherent to the chest wall from his prior XRT. There is a loculated fluid collection at the apex of the chest that is sealed in by the lung and could not be drained. There was a large collection in the  lower pleural space that was drained and talc pleurodesis performed. Hopefully this space will resolve over time with the talc and not refill with fluid. There is no evidence of malignancy so it is possible that this is related to heart  failure given his EF of 20-25%. He reports that he has started back to walking & slowly building up, even played 2 nine-hole rounds of golf when it wasn't too humid; he has a resid cough but decr sput & easier to produce, he requests a handicap placard... See prob list above... EXAM shows Afeb, VSS, O2sat=96% on RA; Wt=178# (up 4#);  HEENT- neg, mallampati2;  Chest- sl decr BS right base, few rhonchi, w/o w/r/consolidation;  Heart- RR Gr1/6SEM w/o r/g;  Abd- soft, neg;  Ext- neg w/o c/c/e...  CXR 04/24/18 (independently reviewed by me in the PACS system) shows left lung clear, right sided vol loss, small A/F levels at the base, pleural fluid vs thickening at right base, stable soft tissue fullness at right apex, s/p CABG etc. IMP/PLAN>>  Glyn appears stable post T-surg & improved w/ CHF-clinic Rx per DrMcLean;  OK 2018 FLU shot today, and continue pulm rx w/ Albut NEBS Tid, Symbicort160-2spBid, GFN400-2Bid;  Call for any questions and we plan f/u visit in 13mo..    Past Medical History:  Diagnosis Date  . AAA (abdominal aortic aneurysm) ([place] 2010   4.4 cm 08/2008;4.44 in 7/10 and 4.65 in 08/2009; 4.8 by CT in 11/2009; 4.3 by ultrasound in 08/2010  . Anemia   . Arteriosclerotic cardiovascular disease (ASCVD) 1996   CABG-1996  .  Arthritis    "fingers" (03/18/2014)  . CAD (coronary artery disease)    03/18/14:  PCI with DES to distal left main. 7/29: DES to the SVG to Diag  . Cancer ([place]    Upper right lobe lung cancer  . Cardiomyopathy, ischemic    Echo 03/17/14: EF 45-50%  . Chronic bronchitis ([place]   . Chronic kidney disease    CRF  . Chronic rhinitis   . Colonic polyp 2002   polypectomy in 2002  . COPD (chronic obstructive pulmonary disease) ([facility]   . Diverticulosis   . Dyspnea    with exertion  . ED (erectile dysfunction)   . Encounter for antineoplastic chemotherapy 12/19/2015  . GERD (gastroesophageal reflux disease)   . History of blood transfusion   . Hyperlipidemia   .  Hypertension   . IFG (impaired fasting glucose)   . Myocardial infarction [facility])    "told h/o silent MI sometime before 1996"  . Pneumonia ~ 2001; ~ 2005    has had more than twice  . Right bundle branch block   . Tobacco abuse, in remission    40 pack year total consumption; discontinued in 1996    Past Surgical History:  Procedure Laterality Date  . ABDOMINAL AORTIC ANEURYSM REPAIR  11/2012  . ABDOMINAL AORTIC ENDOVASCULAR STENT GRAFT N/A 12/11/2012   Procedure: ABDOMINAL AORTIC ENDOVASCULAR STENT GRAFT;  Surgeon: Mal Misty, MD;  Location: [place];  Service: Vascular;  Laterality: N/A;  Ultrasound guided; [place]  . CARDIAC CATHETERIZATION  01/08/1995  . COLONOSCOPY  2002   polypectomy-patient denies  . CORONARY ANGIOPLASTY WITH STENT PLACEMENT  03/18/2014   "1"  . CORONARY ANGIOPLASTY WITH STENT PLACEMENT  03/24/2014   "1"  . CORONARY ARTERY BYPASS GRAFT  01/09/1995   "CABG X3"  . FLEXIBLE BRONCHOSCOPY N/A 03/01/2017   Procedure: FLEXIBLE BRONCHOSCOPY WITH BIOPSIES;  Surgeon: Gaye Pollack, MD;  Location: [facility];  Service: Thoracic;  Laterality: N/A;  . JOINT REPLACEMENT    . LAPAROSCOPIC CHOLECYSTECTOMY  12/2009  . LEFT AND RIGHT HEART CATHETERIZATION WITH CORONARY/GRAFT ANGIOGRAM N/A 03/18/2014   Procedure: LEFT AND RIGHT  HEART CATHETERIZATION WITH Beatrix Fetters;  Surgeon: Blane Ohara, MD;  Location[facility] C[facility] LAB;  Service: Cardiovascular;  Laterality: N/A;  . PERCUTANEOUS CORONARY STENT INTERVENTION (PCI-S)  03/18/2014   Procedure: PERCUTANEOUS CORONARY STENT INTERVENTION (PCI-S);  Surgeon: Blane Ohara, MD;  [facility];  Service: Cardiovascular;;  . PERCUTANEOUS CORONARY STENT INTERVENTION (PCI-S) N/A 03/24/2014   Procedure: PERCUTANEOUS CORONARY STENT INTERVENTION (PCI-S);  Surgeon: Blane Ohara, MD;  Location[facility];  Service: Cardiovascular;  Laterality: N/A;  . PLEURAL EFFUSION DRAINAGE Right 03/01/2017   Procedure: DRAINAGE OF PLEURAL EFFUSION;  Surgeon: Gaye Pollack, MD;  Location: [place];  Service: Thoracic;  Laterality: Right;  . TALC PLEURODESIS Right 03/01/2017   Procedure: Pietro Cassis;  Surgeon: Gaye Pollack, MD;  Location: [facility];  Service: Thoracic;  Laterality: Right;  . TOTAL HIP ARTHROPLASTY Left 01/21/2013   Procedure: TOTAL HIP ARTHROPLASTY ANTERIOR APPROACH;  Surgeon: Mauri Pole, MD;  Location: [place];  Service: Orthopedics;  Laterality: Left;  Marland Kitchen VIDEO ASSISTED THORACOSCOPY Right 03/01/2017   Procedure: VIDEO ASSISTED THORACOSCOPY WITH BIOPSIES;  Surgeon: Gaye Pollack, MD;  Location: [place];  Service: Thoracic;  Laterality: Right;  Marland Kitchen VIDEO BRONCHOSCOPY N/A 11/17/2015   Procedure: VIDEO BRONCHOSCOPY WITH FLUORO;  Surgeon: Rigoberto Noel, MD;  Location: [place];  Service: Cardiopulmonary;  Laterality: N/A;    Outpatient Encounter Prescriptions as of 04/24/2017  Medication Sig  . acetaminophen (TYLENOL) 500 MG tablet Take 500 mg by mouth every 6 (six) hours as needed for mild pain.  Marland Kitchen albuterol (PROVENTIL) (2.5 MG/3ML) 0.083% nebulizer solution Take 3 mLs (2.5 mg total) by nebulization 3 (three) times daily.  Marland Kitchen aspirin EC 81 MG tablet Take 81 mg by mouth daily.  . budesonide-formoterol (SYMBICORT) 160-4.5 MCG/ACT inhaler Inhale 2 puffs into the  lungs 2 (two) times daily.  . carvedilol (COREG) 6.25 MG tablet Take 1 tablet (6.25 mg total) by mouth 2 (two) times daily.  . clopidogrel (PLAVIX) 75 MG tablet TAKE ONE TABLET BY MOUTH DAILY.  Marland Kitchen Ferrous Sulfate (IRON) 28 MG TABS Take 28 mg by mouth daily.  . furosemide (LASIX) 40  MG tablet TAKE ONE TABLET BY MOUTH DAILY.  . Multiple Vitamins-Minerals (CENTRUM SILVER ADULT 50+) TABS Take 1 tablet by mouth daily.   Marland Kitchen NITROSTAT 0.4 MG SL tablet PLACE 1 TAB UNDER TONGUE EVERY 5 MIN IF NEEDED FOR CHEST PAIN. MAY USE 3 TIMES.NO RELIEF CALL 911.  . omeprazole (PRILOSEC) 20 MG capsule TAKE ONE CAPSULE BY MOUTH DAILY.  . sacubitril-valsartan (ENTRESTO) 24-26 MG Take 1 tablet by mouth 2 (two) times daily.  . simvastatin (ZOCOR) 80 MG tablet Take 0.5 tablets (40 mg total) by mouth at bedtime.  . VENTOLIN HFA 108 (90 Base) MCG/ACT inhaler INHALE 2 PUFFS BY MOUTH EVERY 4 TO 6 HOURS AS NEEDED FOR WHEEZING.   No facility-administered encounter medications on file as of 04/24/2017.     Allergies  Allergen Reactions  . Neomycin Hives    Immunization History  Administered Date(s) Administered  . H1N1 08/06/2008  . Influenza Split 06/24/2013  . Influenza,inj,Quad PF,6+ Mos 06/15/2014, 05/31/2015, 05/09/2016  . Influenza-Unspecified 05/27/2012  . Pneumococcal Conjugate-13 08/18/2014  . Pneumococcal Polysaccharide-23 03/27/2012  . Zoster 07/27/2008    Current Medications, Allergies, Past Medical History, Past Surgical History, Family History, and Social History were reviewed in [employer] record.   Review of Systems             All symptoms NEG except where BOLDED >>  Constitutional:  F/C/S, fatigue, anorexia, unexpected weight change. HEENT:  HA, visual changes, hearing loss, earache, nasal symptoms, sore throat, mouth sores, hoarseness. Resp:  cough, sputum, hemoptysis; SOB, tightness, wheezing. Cardio:  CP, palpit, DOE, orthopnea, edema. GI:  N/V/D/C, blood in stool;  reflux, abd pain, distention, gas. GU:  dysuria, freq, urgency, hematuria, flank pain, voiding difficulty. MS:  joint pain, swelling, tenderness, decr ROM; neck pain, back pain, etc. Neuro:  HA, tremors, seizures, dizziness, syncope, weakness, numbness, gait abn. Skin:  suspicious lesions or skin rash. Heme:  adenopathy, bruising, bleeding. Psyche:  confusion, agitation, sleep disturbance, hallucinations, anxiety, depression suicidal.   Objective:   Physical Exam       Vital Signs:  Reviewed...   General:  WD, WN, 76 y/o WM in NAD; alert & oriented; pleasant & cooperative... HEENT:  [place]/AT; Conjunctiva- pink, Sclera- nonicteric, EOM-wnl, PERRLA, EACs-clear, TMs-wnl; NOSE-clear; THROAT-clear & wnl.  Neck:  Supple w/ fair ROM; no JVD; normal carotid impulses w/ faint bruits; no thyromegaly or nodules palpated; no lymphadenopathy.  Chest:  decr BS right base & clear w/o w/r/r & no signs of consolid. Heart:  Regular Rhythm; norm S1 & S2 w/ Gr1/6 SEM without rubs or gallops detected. Abdomen:  Soft & nontender- no guarding or rebound; normal bowel sounds; no organomegaly or masses palpated. Ext:  Sl decr ROM; without deformities +arthritic changes; no varicose veins, +venous insuffic, tr edema;  Pulses decr w/o bruits. Neuro:  CNs II-XII intact; motor testing normal; sensory testing normal; gait normal & balance OK. Derm:  No lesions noted; no rash etc. Lymph:  No cervical, supraclavicular, axillary, or inguinal adenopathy palpated.   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     ASPVD- s/p AAA stent graft 11/2012, mod bilat carotid occlusive dis (asymptomatic) w/ CDopplers showing ~50-60% bilat ICAstenoses>  Stable & seen by [facility] 01/2016- plans yearly f/u CT Abd and CDopplers    MEDICAL issues>  HBP, HL, GERD/ Divertic/ Polyps, CKD-stage3, Anemia & thrombocytopenia>  On Simva80-1/2Qd, Prilosec20/d, Fe/ MVI/ etc...  PLAN>>  02/29/16>   Problem list as above w/ signif cardiac, pulmonary, & post chemoradiation changes;  From the pulm standpoint- he feels the BREO is helping & hasn't needed the rescue inhaler very often, continue same;  He is on a long PRED taper per Paul Sandoval for poss radiation fibrosis RUL, Sed rate is 53, continue this taper & careful w/ sweets/ sugar etc;  From the cardiac standpoint- he has signif underlying dis w/ Lmain stent, s/p CABG 20 yrs ago, prob ischemic cardiomyopathy w/ worsening 2DEcho recently w/ 30-35% EF + HK&AK seen, BNP=1053, Cards plans ischemic work up when able, in the meanwhile he has improved w/ diuresis but Chems w/ mild RI & HCO3=36... REC- add DIAMOX-ER 500 one tab daily at 4pm, continue his Lasix40 Qam, increase water intake, watch weights... We plan ROV 36monthrecheck... 04/04/16>   Discussed w/ Zailen-- continue the Levaquin til gone, use Tylenol for fever or pain; continue the Breo daily & Ventolin-HFA as needed; wean off the Diamox at this time, continue Lasix40, no salt, etc; he needs to gradually increase his exercise/ mobility; we will plan ROV w/ labs in 268mo 06/06/16>   He is feeling better, performance status improving, CXR shows progressive changes in RUL s/p chemoradiation; CT=>PET scans w/ vol loss & persistent hypermetabolism/ evolving changes in RUL & right hilum plus he has a mod large right effusion; prev thoracentesis 01/2016  in hosp was transudative- I believe he would benefit from further eval/ repeat thoracentesis to recheck this fluid; we will set this up via IR & ask them to drain the fluid as much as poss, send it for Cytology, cell ct & diff, TProt/ LDH/ Gluc => 1.2L removed, prob exudate, NEG cytology, & we will follow... 08/06/16>   BiNicklauss clinically stable, no clear evid of recurrent dis & followed very closely by Paul Sandoval; Pulm stable on Symbicort Bid, Albut rescue prn, and his exercise- continue same 7 we plan rov recheck in 11m35mo12/18>    BilDelio doing satis on Symbicort160-2spBid & Albut rescue inhaler prn; he states that he is active, energy is good, & that he's getting stronger & better (good performance status); I suspect that he has residual cancer in the right chest but it appears slowly progressive (w/ sl incr right effusion & subcarinal LN) and Paul Sandoval to review status & decide regarding additional therapy at this point vs continued observation...  01/02/17>   BilShakil improved but it's been a slow process; he is at high risk for cancer progression & followed closely by Paul [facility]oval who plans f/u CT & recheck in June;  Continue current meds, add Guaifenesin 2400m76m+ water to aid in mucus production... We will recheck in 73mo 55moview his f/u scans planned by Oncology. 02/04/17>   SEE ABOVE - Rx Pred taper & Levaquin=> no better; R-Thoracentesis by IR w/ only 280cc exudative loculated fluid removed, cytology- NEG; Limited options at this point- Rx  w/ Augmentin empirically, add NEBs w/ Albuterol Tid, continue GFN 2478m/d;  We will check 2DEcho and refer to TCTS to consider further Rx options for right effusion... 03/13/17>   The worsening LVF w/ EF 276-19%is certainly playing a roll in his unrespolved dyspnea=> refer to CHF clinic for active management; in the interim continue NEBs, Symbicort, Mucinex, etc.. 04/24/17>   BYigitappears stable post T-surg & improved w/ CHF-clinic Rx per DrMcLean;  OK 2018 FLU shot  today, and continue pulm rx w/ Albut NEBS Tid, Symbicort160-2spBid, GFN400-2Bid;  Call for any questions and we plan f/u visit in 278mo  Plan:     Patient's Medications  New Prescriptions   No medications on file  Previous Medications   ACETAMINOPHEN (TYLENOL) 500 MG TABLET    Take 500 mg by mouth every 6 (six) hours as needed for mild pain.   ALBUTEROL (PROVENTIL) (2.5 MG/3ML) 0.083% NEBULIZER SOLUTION    Take 3 mLs (2.5 mg total) by nebulization 3 (three) times daily.   ASPIRIN EC 81 MG TABLET    Take 81 mg by mouth daily.   BUDESONIDE-FORMOTEROL (SYMBICORT) 160-4.5 MCG/ACT INHALER    Inhale 2 puffs into the lungs 2 (two) times daily.   CARVEDILOL (COREG) 6.25 MG TABLET    Take 1 tablet (6.25 mg total) by mouth 2 (two) times daily.   CLOPIDOGREL (PLAVIX) 75 MG TABLET    TAKE ONE TABLET BY MOUTH DAILY.   FERROUS SULFATE (IRON) 28 MG TABS    Take 28 mg by mouth daily.   FUROSEMIDE (LASIX) 40 MG TABLET    TAKE ONE TABLET BY MOUTH DAILY.   MULTIPLE VITAMINS-MINERALS (CENTRUM SILVER ADULT 50+) TABS    Take 1 tablet by mouth daily.    NITROSTAT 0.4 MG SL TABLET    PLACE 1 TAB UNDER TONGUE EVERY 5 MIN IF NEEDED FOR CHEST PAIN. MAY USE 3 TIMES.NO RELIEF CALL 911.   OMEPRAZOLE (PRILOSEC) 20 MG CAPSULE    TAKE ONE CAPSULE BY MOUTH DAILY.   SACUBITRIL-VALSARTAN (ENTRESTO) 24-26 MG    Take 1 tablet by mouth 2 (two) times daily.   SIMVASTATIN (ZOCOR) 80 MG TABLET    Take 0.5 tablets (40 mg total) by mouth at bedtime.   VENTOLIN HFA 108 (90 BASE) MCG/ACT INHALER    INHALE 2 PUFFS BY MOUTH EVERY 4 TO 6 HOURS AS NEEDED FOR WHEEZING.  Modified Medications   No medications on file  Discontinued Medications   No medications on file

## 2017-04-24 NOTE — Patient Instructions (Signed)
Today we updated your med list in our EPIC system...    Continue your current medications the same...  Continue the NEBS, Symbicort, & Guaifenesin...  Continue the new meds & follow up from [facility]...  Today we did a follow up CXR...    We will contact you w/ the results when available...   We wrote for a handicap permit for your use...  Call for any questions...  Let's plan a follow up visit in 80mo, sooner if needed for problems.Marland KitchenMarland Kitchen

## 2017-04-30 DIAGNOSIS — J449 Chronic obstructive pulmonary disease, unspecified: Secondary | ICD-10-CM | POA: Diagnosis not present

## 2017-05-06 ENCOUNTER — Ambulatory Visit (INDEPENDENT_AMBULATORY_CARE_PROVIDER_SITE_OTHER): Payer: Medicare Other | Admitting: Family Medicine

## 2017-05-06 ENCOUNTER — Encounter: Payer: Self-pay | Admitting: Family Medicine

## 2017-05-06 VITALS — BP 120/68 | Temp 98.6°F | Ht 70.0 in | Wt 180.5 lb

## 2017-05-06 DIAGNOSIS — J441 Chronic obstructive pulmonary disease with (acute) exacerbation: Secondary | ICD-10-CM | POA: Diagnosis not present

## 2017-05-06 MED ORDER — DOXYCYCLINE HYCLATE 100 MG PO TABS: 100.0000 mg | ORAL_TABLET | Freq: Two times a day (BID) | ORAL | 0 refills | Status: DC

## 2017-05-06 MED ORDER — PREDNISONE 20 MG PO TABS: ORAL_TABLET | ORAL | 0 refills | Status: DC

## 2017-05-06 NOTE — Progress Notes (Signed)
   Subjective:    Patient ID: Paul Sandoval, male    DOB: Feb 01, 1941, 76 y.o.   MRN: [id number]  Cough  This is a new problem. The current episode started in the past 7 days. The cough is non-productive. Associated symptoms include wheezing. Treatments tried: Mucus Relief    Uses inhaler tid reg  On high dose guanfenisin and also probiotics  Noticed definitely tighter and wheezy last couple days   Patient is under treatment for lung cancer.  Patient also due to have cath scheduled later the this week  Review of Systems  Respiratory: Positive for cough and wheezing.        Objective:   Physical Exam Alert active talkative no major distress HEENT normal bilateral wheezes occasional cough during exam no inspiratory crackles no tachypnea       Assessment & Plan:  Impression exacerbation COPD plan prednisone taper. Doxycycline twice a day. Symptom care discussed albuterol when necessary

## 2017-05-07 ENCOUNTER — Telehealth (HOSPITAL_COMMUNITY): Payer: Self-pay

## 2017-05-07 NOTE — Telephone Encounter (Signed)
Rescheduled cath from 9/14 to 9/19 at 9 am due to pt. having pneumonia and taking treatment.

## 2017-05-15 ENCOUNTER — Encounter (HOSPITAL_COMMUNITY): Payer: Self-pay | Admitting: Cardiology

## 2017-05-15 ENCOUNTER — Encounter (HOSPITAL_COMMUNITY)
Admission: RE | Disposition: A | Discharge status: Home / Self Care | Payer: Self-pay | Source: Ambulatory Visit | Attending: Cardiology

## 2017-05-15 ENCOUNTER — Ambulatory Visit (HOSPITAL_COMMUNITY)
Admission: RE | Admit: 2017-05-15 | Discharge: 2017-05-15 | Disposition: A | Discharge status: Home / Self Care | Payer: Medicare Other | Source: Ambulatory Visit | Attending: Cardiology | Admitting: Cardiology

## 2017-05-15 DIAGNOSIS — I509 Heart failure, unspecified: Secondary | ICD-10-CM

## 2017-05-15 DIAGNOSIS — I130 Hypertensive heart and chronic kidney disease with heart failure and stage 1 through stage 4 chronic kidney disease, or unspecified chronic kidney disease: Secondary | ICD-10-CM | POA: Diagnosis not present

## 2017-05-15 DIAGNOSIS — I2582 Chronic total occlusion of coronary artery: Secondary | ICD-10-CM | POA: Diagnosis not present

## 2017-05-15 DIAGNOSIS — Z7951 Long term (current) use of inhaled steroids: Secondary | ICD-10-CM | POA: Diagnosis not present

## 2017-05-15 DIAGNOSIS — Z923 Personal history of irradiation: Secondary | ICD-10-CM | POA: Diagnosis not present

## 2017-05-15 DIAGNOSIS — Z9221 Personal history of antineoplastic chemotherapy: Secondary | ICD-10-CM | POA: Insufficient documentation

## 2017-05-15 DIAGNOSIS — C3490 Malignant neoplasm of unspecified part of unspecified bronchus or lung: Secondary | ICD-10-CM | POA: Diagnosis not present

## 2017-05-15 DIAGNOSIS — Y831 Surgical operation with implant of artificial internal device as the cause of abnormal reaction of the patient, or of later complication, without mention of misadventure at the time of the procedure: Secondary | ICD-10-CM | POA: Diagnosis not present

## 2017-05-15 DIAGNOSIS — I255 Ischemic cardiomyopathy: Secondary | ICD-10-CM | POA: Insufficient documentation

## 2017-05-15 DIAGNOSIS — Z7982 Long term (current) use of aspirin: Secondary | ICD-10-CM | POA: Insufficient documentation

## 2017-05-15 DIAGNOSIS — I25810 Atherosclerosis of coronary artery bypass graft(s) without angina pectoris: Secondary | ICD-10-CM | POA: Insufficient documentation

## 2017-05-15 DIAGNOSIS — Z7902 Long term (current) use of antithrombotics/antiplatelets: Secondary | ICD-10-CM | POA: Diagnosis not present

## 2017-05-15 DIAGNOSIS — E785 Hyperlipidemia, unspecified: Secondary | ICD-10-CM | POA: Insufficient documentation

## 2017-05-15 DIAGNOSIS — Z8679 Personal history of other diseases of the circulatory system: Secondary | ICD-10-CM | POA: Insufficient documentation

## 2017-05-15 DIAGNOSIS — I5022 Chronic systolic (congestive) heart failure: Secondary | ICD-10-CM | POA: Insufficient documentation

## 2017-05-15 DIAGNOSIS — T82855A Stenosis of coronary artery stent, initial encounter: Secondary | ICD-10-CM | POA: Insufficient documentation

## 2017-05-15 DIAGNOSIS — Z87891 Personal history of nicotine dependence: Secondary | ICD-10-CM | POA: Insufficient documentation

## 2017-05-15 DIAGNOSIS — N183 Chronic kidney disease, stage 3 (moderate): Secondary | ICD-10-CM | POA: Insufficient documentation

## 2017-05-15 DIAGNOSIS — J449 Chronic obstructive pulmonary disease, unspecified: Secondary | ICD-10-CM | POA: Insufficient documentation

## 2017-05-15 DIAGNOSIS — I2510 Atherosclerotic heart disease of native coronary artery without angina pectoris: Secondary | ICD-10-CM | POA: Diagnosis not present

## 2017-05-15 DIAGNOSIS — R0609 Other forms of dyspnea: Secondary | ICD-10-CM | POA: Diagnosis not present

## 2017-05-15 HISTORY — PX: RIGHT/LEFT HEART CATH AND CORONARY/GRAFT ANGIOGRAPHY: CATH118267

## 2017-05-15 LAB — CBC
HCT: 37.2 % — ABNORMAL LOW (ref 39.0–52.0)
HEMOGLOBIN: 11.5 g/dL — AB (ref 13.0–17.0)
MCH: 26.9 pg (ref 26.0–34.0)
MCHC: 30.9 g/dL (ref 30.0–36.0)
MCV: 87.1 fL (ref 78.0–100.0)
PLATELETS: 177 10*3/uL (ref 150–400)
RBC: 4.27 MIL/uL (ref 4.22–5.81)
RDW: 18.6 % — ABNORMAL HIGH (ref 11.5–15.5)
WBC: 9.3 10*3/uL (ref 4.0–10.5)

## 2017-05-15 LAB — POCT I-STAT 3, VENOUS BLOOD GAS (G3P V)
Acid-Base Excess: 5 mmol/L — ABNORMAL HIGH (ref 0.0–2.0)
Acid-Base Excess: 5 mmol/L — ABNORMAL HIGH (ref 0.0–2.0)
BICARBONATE: 31 mmol/L — AB (ref 20.0–28.0)
BICARBONATE: 31.2 mmol/L — AB (ref 20.0–28.0)
O2 SAT: 77 %
O2 Saturation: 75 %
PCO2 VEN: 51.1 mmHg (ref 44.0–60.0)
PCO2 VEN: 51.6 mmHg (ref 44.0–60.0)
PH VEN: 7.387 (ref 7.250–7.430)
PO2 VEN: 41 mmHg (ref 32.0–45.0)
PO2 VEN: 42 mmHg (ref 32.0–45.0)
TCO2: 33 mmol/L — AB (ref 22–32)
TCO2: 33 mmol/L — AB (ref 22–32)
pH, Ven: 7.393 (ref 7.250–7.430)

## 2017-05-15 LAB — BASIC METABOLIC PANEL
Anion gap: 7 (ref 5–15)
BUN: 16 mg/dL (ref 6–20)
CHLORIDE: 100 mmol/L — AB (ref 101–111)
CO2: 30 mmol/L (ref 22–32)
Calcium: 8.8 mg/dL — ABNORMAL LOW (ref 8.9–10.3)
Creatinine, Ser: 1.43 mg/dL — ABNORMAL HIGH (ref 0.61–1.24)
GFR, EST AFRICAN AMERICAN: 53 mL/min — AB (ref >60)
GFR, EST NON AFRICAN AMERICAN: 46 mL/min — AB (ref >60)
Glucose, Bld: 97 mg/dL (ref 65–99)
POTASSIUM: 4.2 mmol/L (ref 3.5–5.1)
SODIUM: 137 mmol/L (ref 135–145)

## 2017-05-15 LAB — PROTIME-INR
INR: 1.04
PROTHROMBIN TIME: 13.5 s (ref 11.4–15.2)

## 2017-05-15 SURGERY — RIGHT/LEFT HEART CATH AND CORONARY/GRAFT ANGIOGRAPHY
Anesthesia: LOCAL

## 2017-05-15 MED ORDER — IOPAMIDOL (ISOVUE-370) INJECTION 76%
INTRAVENOUS | Status: AC
  Filled 2017-05-15: qty 125

## 2017-05-15 MED ORDER — SODIUM CHLORIDE 0.9% FLUSH: 3.0000 mL | Freq: Two times a day (BID) | INTRAVENOUS | Status: DC

## 2017-05-15 MED ORDER — ACETAMINOPHEN 325 MG PO TABS: 650.0000 mg | ORAL_TABLET | ORAL | Status: DC | PRN

## 2017-05-15 MED ORDER — FENTANYL CITRATE (PF) 100 MCG/2ML IJ SOLN
INTRAMUSCULAR | Status: AC
  Filled 2017-05-15: qty 2

## 2017-05-15 MED ORDER — VERAPAMIL HCL 2.5 MG/ML IV SOLN
INTRAVENOUS | Status: AC
  Filled 2017-05-15: qty 2

## 2017-05-15 MED ORDER — MIDAZOLAM HCL 2 MG/2ML IJ SOLN
INTRAMUSCULAR | Status: AC
  Filled 2017-05-15: qty 2

## 2017-05-15 MED ORDER — FENTANYL CITRATE (PF) 100 MCG/2ML IJ SOLN
INTRAMUSCULAR | Status: DC | PRN
  Administered 2017-05-15: 25 ug via INTRAVENOUS

## 2017-05-15 MED ORDER — SODIUM CHLORIDE 0.9 % IV SOLN: 250.0000 mL | INTRAVENOUS | Status: DC | PRN

## 2017-05-15 MED ORDER — LIDOCAINE HCL (PF) 1 % IJ SOLN
INTRAMUSCULAR | Status: DC | PRN
  Administered 2017-05-15 (×2): 2 mL
  Administered 2017-05-15: 5 mL

## 2017-05-15 MED ORDER — SODIUM CHLORIDE 0.9% FLUSH: 3.0000 mL | INTRAVENOUS | Status: DC | PRN

## 2017-05-15 MED ORDER — LIDOCAINE HCL 2 % IJ SOLN
INTRAMUSCULAR | Status: AC
  Filled 2017-05-15: qty 10

## 2017-05-15 MED ORDER — SODIUM CHLORIDE 0.9 % WEIGHT BASED INFUSION: 1.0000 mL/kg/h | INTRAVENOUS | Status: DC

## 2017-05-15 MED ORDER — HEPARIN (PORCINE) IN NACL 2-0.9 UNIT/ML-% IJ SOLN
INTRAMUSCULAR | Status: AC
  Filled 2017-05-15: qty 1000

## 2017-05-15 MED ORDER — ONDANSETRON HCL 4 MG/2ML IJ SOLN: 4.0000 mg | Freq: Four times a day (QID) | INTRAMUSCULAR | Status: DC | PRN

## 2017-05-15 MED ORDER — HEPARIN (PORCINE) IN NACL 2-0.9 UNIT/ML-% IJ SOLN
INTRAMUSCULAR | Status: DC | PRN
  Administered 2017-05-15: 10:00:00 via INTRA_ARTERIAL

## 2017-05-15 MED ORDER — ASPIRIN 81 MG PO CHEW: 81.0000 mg | CHEWABLE_TABLET | ORAL | Status: DC

## 2017-05-15 MED ORDER — IOPAMIDOL (ISOVUE-370) INJECTION 76%
INTRAVENOUS | Status: DC | PRN
  Administered 2017-05-15: 65 mL via INTRAVENOUS

## 2017-05-15 MED ORDER — MIDAZOLAM HCL 2 MG/2ML IJ SOLN
INTRAMUSCULAR | Status: DC | PRN
  Administered 2017-05-15: 1 mg via INTRAVENOUS

## 2017-05-15 MED ORDER — HEPARIN SODIUM (PORCINE) 1000 UNIT/ML IJ SOLN
INTRAMUSCULAR | Status: AC
  Filled 2017-05-15: qty 1

## 2017-05-15 MED ORDER — HEPARIN SODIUM (PORCINE) 1000 UNIT/ML IJ SOLN
INTRAMUSCULAR | Status: DC | PRN
  Administered 2017-05-15: 4000 [IU] via INTRAVENOUS

## 2017-05-15 MED ORDER — SODIUM CHLORIDE 0.9 % IV SOLN
INTRAVENOUS | Status: DC
Start: 2017-05-15 — End: 2017-05-15
  Administered 2017-05-15: 09:00:00 via INTRAVENOUS

## 2017-05-15 SURGICAL SUPPLY — 13 items
CATH 5FR JL3.5 JR4 ANG PIG MP (CATHETERS) ×2 IMPLANT
CATH BALLN WEDGE 5F 110CM (CATHETERS) ×2 IMPLANT
DEVICE RAD COMP TR BAND LRG (VASCULAR PRODUCTS) ×2 IMPLANT
GLIDESHEATH SLEND SS 6F .021 (SHEATH) ×2 IMPLANT
GUIDEWIRE INQWIRE 1.5J.035X260 (WIRE) ×1 IMPLANT
INQWIRE 1.5J .035X260CM (WIRE) ×2
KIT HEART LEFT (KITS) ×2 IMPLANT
PACK CARDIAC CATHETERIZATION (CUSTOM PROCEDURE TRAY) ×2 IMPLANT
SHEATH GLIDE SLENDER 4/5FR (SHEATH) ×2 IMPLANT
TRANSDUCER W/STOPCOCK (MISCELLANEOUS) ×2 IMPLANT
TUBING CIL FLEX 10 FLL-RA (TUBING) ×2 IMPLANT
WIRE EMERALD 3MM-J .025X260CM (WIRE) ×2 IMPLANT
WIRE PT2 LS 185 (WIRE) ×2 IMPLANT

## 2017-05-15 NOTE — H&P (View-Only) (Signed)
PCP: Dr. Wolfgang Phoenix Pulmonary: Dr. Lenna Gilford HF Cardiology: Dr. Aundra Dubin  76 yo with history of COPD, CAD s/p CABG in 1996 and PCI to dLM and SVG-D in 7/15, ischemic cardiomyopathy, and non-small cell lung cancer presents for CHF clinic evaluation.  He was referred to [place] by Dr. Lenna Gilford after fall in EF and worsening dyspnea was noted.   Patient reports that his initial symptoms prior to CABG in 1996 were dyspnea.  He has never had any worrisome chest pain.  He did well initially after CABG and was golfing regularly and walking 2-3 miles/day up until around 3/17.  He had one episode of increased dyspnea in 2015 and had cath with PCI to distal LM and SVG-D.  In 3/17, he was diagnosed with NSCLC and was treated with radiation and chemotherapy.  Since then, he has had progressive exertional dyspnea.  He is short of breath walking up a flight of stairs.  His walk has been cut back to about 1 mile now.  He can walk about 1/2 mile then has to rest due to dyspnea.  He will then walk the 2nd mile.  Prior to 1 year ago, he could walk 3 miles without stopping.  No orthopnea/PND.  No chest pain.  He was noted on echo in 7/18 to have EF down to 20-25%.  No lightheadedness, no palpitations.    ECG (6/18, personally reviewed): NSR, RBBB  Labs (9/17): LDL 90 Labs (7/18): K 3.5, creatinine 1.28, hgb 10.7  PMH: 1. COPD: PFTs (6/17) with severe obstructive airways disease.  2. AAA: s/p repair with stent graft.  3. Non-small cell lung cancer: Stage IIIA, diagnosed 3/17.  He had radiation as well as chemotherapy with carboplatin and paclitaxel.   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 PNA in 6/17 6. Recurrent right pleural effusion: VATS with talc pleurodesis on right.  No malignant cells noted.  7. HTN 8. H/o THR 9. CAD: CABG 1996 with SVG-D, SVG-PDA, LIMA-LAD.   - LHC (7/15): Totally occluded RCA and LAD.  Severe distal LM stenosis.  Patent SVG-PDA, patent LIMA-LAD.  Severe disease SVG-D.  Patient had DES to distal left main and staged PCI of  SVG-D.   - Cardiolite (7/17): EF 30%, prior infarction with no ischemia.  10. Chronic systolic CHF: Ischemic cardiomyopathy.   - Echo (6/17): EF 30-35%. - Echo (7/18): EF 20-25%, moderate RV dilation with mildly decreased RV systolic function.   Social History   Social History  . Marital status: Married    Spouse name: N/A  . Number of children: 1  . Years of education: N/A   Occupational History  . Retired     [employer]   Social History Main Topics  . Smoking status: Former Smoker    Packs/day: 1.50    Years: 30.00    Types: Cigarettes    Start date: 12/01/1956    Quit date: 01/08/1995  . Smokeless tobacco: Never Used  . Alcohol use 0.0 oz/week     Comment: 03/18/2014 "no alacohol since 1996"  . Drug use: No  . Sexual activity: No   Other Topics Concern  . Not on file   Social History Narrative  . No narrative on file   Family History  Problem Relation Age of Onset  . Heart disease Father   . Cancer Father        Lung  . Arthritis Mother   . Parkinsonism Mother   . Arthritis Sister        Brother with  rheumatoid arthritis  . Hypertension Brother   . Colon cancer Neg Hx   . Colon polyps Neg Hx    ROS: All systems reviewed and negative except as per HPI.   Current Outpatient Prescriptions  Medication Sig Dispense Refill  . acetaminophen (TYLENOL) 500 MG tablet Take 500 mg by mouth every 6 (six) hours as needed for mild pain.    Marland Kitchen albuterol (PROVENTIL) (2.5 MG/3ML) 0.083% nebulizer solution Take 3 mLs (2.5 mg total) by nebulization 3 (three) times daily. 270 mL 11  . aspirin EC 81 MG tablet Take 81 mg by mouth daily.    . budesonide-formoterol (SYMBICORT) 160-4.5 MCG/ACT inhaler Inhale 2 puffs into the lungs 2 (two) times daily.    . clopidogrel (PLAVIX) 75 MG tablet TAKE ONE TABLET BY MOUTH DAILY. 90 tablet 2  . Ferrous Sulfate (IRON) 28 MG TABS Take 28 mg by mouth daily.    . furosemide (LASIX) 40 MG tablet TAKE ONE TABLET BY MOUTH DAILY. 30 tablet  0  . Multiple Vitamins-Minerals (CENTRUM SILVER ADULT 50+) TABS Take 1 tablet by mouth daily.     Marland Kitchen NITROSTAT 0.4 MG SL tablet PLACE 1 TAB UNDER TONGUE EVERY 5 MIN IF NEEDED FOR CHEST PAIN. MAY USE 3 TIMES.NO RELIEF CALL 911. 25 tablet 1  . omeprazole (PRILOSEC) 20 MG capsule TAKE ONE CAPSULE BY MOUTH DAILY. 90 capsule 1  . simvastatin (ZOCOR) 80 MG tablet Take 0.5 tablets (40 mg total) by mouth at bedtime. 15 tablet 5  . VENTOLIN HFA 108 (90 Base) MCG/ACT inhaler INHALE 2 PUFFS BY MOUTH EVERY 4 TO 6 HOURS AS NEEDED FOR WHEEZING. 18 g 0  . carvedilol (COREG) 6.25 MG tablet Take 1 tablet (6.25 mg total) by mouth 2 (two) times daily. 60 tablet 3  . sacubitril-valsartan (ENTRESTO) 24-26 MG Take 1 tablet by mouth 2 (two) times daily. 60 tablet 3   No current facility-administered medications for this encounter.    BP 137/65   Pulse 96   Wt 177 lb 12.8 oz (80.6 kg)   SpO2 96%   BMI 25.51 kg/m  General: NAD Neck: No JVD, no thyromegaly or thyroid nodule.  Lungs: Decreased breath sounds right base. CV: Nondisplaced PMI.  Heart regular S1/S2 (widely split S2), no S3/S4, no murmur.  No peripheral edema.  No carotid bruit.  Normal pedal pulses.  Abdomen: Soft, nontender, no hepatosplenomegaly, no distention.  Skin: Intact without lesions or rashes.  Neurologic: Alert and oriented x 3.  Psych: Normal affect. Extremities: No clubbing or cyanosis.  HEENT: Normal.   Assessment/Plan: 1. Chronic systolic CHF: Ischemic cardiomyopathy.  He has had a steady fall in EF on imaging studies, most recently had echo in 7/18 with EF 20-25%.  NYHA class III symptoms with worsened dyspnea over the last year.  On exam, he is not volume overloaded.  - Stop metoprolol tartrate.  - Start Coreg 6.25 mg bid. - Start Entresto 24/26 bid.  He appears to have enough BP room.  - Eventually would like to get him on spironolactone.  - BMET/BNP today, repeat BMET 10 days.  2. CAD: s/p CABG then PCI 7/15 to distal LM and  SVG-D.  Anginal equivalent in the past appears to have been dyspnea, he has never had significant chest pain.  Now presenting with worsening dyspnea and fall in EF.  As above, he is not volume overloaded on exam.  It appears that he has completed his lung cancer treatment regimen and is awaiting scans to reassess.   -  Continue ASA 81, Plavix, and simvastatin.  - We discussed right/left heart cath to assess for volume overload and also coronary angiography to look for progressive disease. I told him the risks/benefits of procedure and he is willing to have it done.  3. COPD: Severe COPD by PFTs in 6/17.  COPD could certainly be contributing to his exertional dyspnea.  He no longer smokes.  4. Hyperlipidemia: LDL was above goal (<70) when last checked.  Repeat today.  5. NSCLC: Follows with Dr. Earlie Server.  He has finished chemo/radiation and will get scans to assess for residual disease. He does not appear to have had any significantly cardiotoxic agents with chemotherapy.  6. Exertional dyspnea: See above.  Not obviously volume overloaded on exam but EF is lower.  Severe COPD could play a role. Also concerned for progressive coronary disease given prior exertional dyspnea as anginal equivalent.  Plan for RHC/LHC as above.   Followup in 2-3 weeks.   Loralie Champagne 04/23/2017

## 2017-05-15 NOTE — Discharge Instructions (Signed)

## 2017-05-15 NOTE — Progress Notes (Signed)
Site area: left brachial  Site Prior to Removal:  Level 0  Pressure Applied For 5 MINUTES    Minutes Beginning at 1040  Manual: yes  Patient Status During Pull: good  Post Pull site left brachial good  Post Pull Instructions Given:yes  Post Pull Pulses Present:  yes  Dressing Applied:  yes  Comments:  Pt tolerated procedure well

## 2017-05-15 NOTE — Interval H&P Note (Signed)
History and Physical Interval Note:  05/15/2017 9:15 AM  Paul Sandoval  has presented today for surgery, with the diagnosis of hf  The various methods of treatment have been discussed with the patient and family. After consideration of risks, benefits and other options for treatment, the patient has consented to  Procedure(s): RIGHT/LEFT HEART CATH AND CORONARY/GRAFT ANGIOGRAPHY (N/A) as a surgical intervention .  The patient's history has been reviewed, patient examined, no change in status, stable for surgery.  I have reviewed the patient's chart and labs.  Questions were answered to the patient's satisfaction.     Paul Sandoval

## 2017-05-18 DIAGNOSIS — J449 Chronic obstructive pulmonary disease, unspecified: Secondary | ICD-10-CM | POA: Diagnosis not present

## 2017-05-21 ENCOUNTER — Other Ambulatory Visit: Payer: Self-pay | Admitting: Surgery

## 2017-05-21 ENCOUNTER — Encounter (HOSPITAL_COMMUNITY): Payer: Self-pay

## 2017-05-21 ENCOUNTER — Ambulatory Visit (HOSPITAL_COMMUNITY)
Admission: RE | Admit: 2017-05-21 | Discharge: 2017-05-21 | Disposition: A | Discharge status: Home / Self Care | Payer: Medicare Other | Source: Ambulatory Visit | Attending: Internal Medicine | Admitting: Internal Medicine

## 2017-05-21 ENCOUNTER — Other Ambulatory Visit (HOSPITAL_BASED_OUTPATIENT_CLINIC_OR_DEPARTMENT_OTHER): Payer: Medicare Other

## 2017-05-21 DIAGNOSIS — K449 Diaphragmatic hernia without obstruction or gangrene: Secondary | ICD-10-CM | POA: Diagnosis not present

## 2017-05-21 DIAGNOSIS — J439 Emphysema, unspecified: Secondary | ICD-10-CM | POA: Insufficient documentation

## 2017-05-21 DIAGNOSIS — C3411 Malignant neoplasm of upper lobe, right bronchus or lung: Secondary | ICD-10-CM | POA: Insufficient documentation

## 2017-05-21 DIAGNOSIS — J948 Other specified pleural conditions: Secondary | ICD-10-CM | POA: Diagnosis not present

## 2017-05-21 DIAGNOSIS — C3490 Malignant neoplasm of unspecified part of unspecified bronchus or lung: Secondary | ICD-10-CM

## 2017-05-21 DIAGNOSIS — I700 Atherosclerosis of aorta: Secondary | ICD-10-CM | POA: Diagnosis not present

## 2017-05-21 LAB — COMPREHENSIVE METABOLIC PANEL
ALBUMIN: 3.1 g/dL — AB (ref 3.5–5.0)
ALK PHOS: 86 U/L (ref 40–150)
ALT: 14 U/L (ref 0–55)
AST: 19 U/L (ref 5–34)
Anion Gap: 7 mEq/L (ref 3–11)
BILIRUBIN TOTAL: 0.57 mg/dL (ref 0.20–1.20)
BUN: 14.6 mg/dL (ref 7.0–26.0)
CALCIUM: 9.4 mg/dL (ref 8.4–10.4)
CO2: 29 mEq/L (ref 22–29)
Chloride: 101 mEq/L (ref 98–109)
Creatinine: 1.2 mg/dL (ref 0.7–1.3)
EGFR: 57 mL/min/{1.73_m2} — ABNORMAL LOW (ref >90)
GLUCOSE: 95 mg/dL (ref 70–140)
Potassium: 4.1 mEq/L (ref 3.5–5.1)
SODIUM: 137 meq/L (ref 136–145)
TOTAL PROTEIN: 7.1 g/dL (ref 6.4–8.3)

## 2017-05-21 LAB — CBC WITH DIFFERENTIAL/PLATELET
BASO%: 0.1 % (ref 0.0–2.0)
BASOS ABS: 0 10*3/uL (ref 0.0–0.1)
EOS ABS: 0.2 10*3/uL (ref 0.0–0.5)
EOS%: 2.4 % (ref 0.0–7.0)
HEMATOCRIT: 36 % — AB (ref 38.4–49.9)
HEMOGLOBIN: 11.1 g/dL — AB (ref 13.0–17.1)
LYMPH#: 0.8 10*3/uL — AB (ref 0.9–3.3)
LYMPH%: 10.8 % — ABNORMAL LOW (ref 14.0–49.0)
MCH: 26.7 pg — AB (ref 27.2–33.4)
MCHC: 30.8 g/dL — ABNORMAL LOW (ref 32.0–36.0)
MCV: 86.5 fL (ref 79.3–98.0)
MONO#: 0.8 10*3/uL (ref 0.1–0.9)
MONO%: 11.7 % (ref 0.0–14.0)
NEUT%: 75 % (ref 39.0–75.0)
NEUTROS ABS: 5.3 10*3/uL (ref 1.5–6.5)
Platelets: 98 10*3/uL — ABNORMAL LOW (ref 140–400)
RBC: 4.16 10*6/uL — ABNORMAL LOW (ref 4.20–5.82)
RDW: 17.5 % — AB (ref 11.0–14.6)
WBC: 7.1 10*3/uL (ref 4.0–10.3)

## 2017-05-21 MED ORDER — IOPAMIDOL (ISOVUE-300) INJECTION 61%
75.0000 mL | Freq: Once | INTRAVENOUS | Status: AC | PRN
  Administered 2017-05-21: 75 mL via INTRAVENOUS

## 2017-05-21 MED ORDER — IOPAMIDOL (ISOVUE-300) INJECTION 61%
INTRAVENOUS | Status: AC
  Filled 2017-05-21: qty 75

## 2017-05-22 ENCOUNTER — Ambulatory Visit (INDEPENDENT_AMBULATORY_CARE_PROVIDER_SITE_OTHER): Payer: Self-pay | Admitting: Surgery

## 2017-05-22 ENCOUNTER — Encounter: Payer: Medicare Other | Admitting: Surgery

## 2017-05-22 ENCOUNTER — Ambulatory Visit
Admission: RE | Admit: 2017-05-22 | Discharge: 2017-05-22 | Disposition: A | Discharge status: Home / Self Care | Payer: Medicare Other | Source: Ambulatory Visit | Attending: Surgery | Admitting: Surgery

## 2017-05-22 ENCOUNTER — Encounter: Payer: Self-pay | Admitting: Surgery

## 2017-05-22 VITALS — BP 90/52 | HR 88 | Resp 16 | Ht 70.0 in | Wt 180.0 lb

## 2017-05-22 DIAGNOSIS — Z09 Encounter for follow-up examination after completed treatment for conditions other than malignant neoplasm: Secondary | ICD-10-CM

## 2017-05-22 DIAGNOSIS — C3490 Malignant neoplasm of unspecified part of unspecified bronchus or lung: Secondary | ICD-10-CM

## 2017-05-22 DIAGNOSIS — R05 Cough: Secondary | ICD-10-CM | POA: Diagnosis not present

## 2017-05-22 DIAGNOSIS — J948 Other specified pleural conditions: Secondary | ICD-10-CM

## 2017-05-22 NOTE — Progress Notes (Addendum)
HPI:  Patient returns for routine postoperative follow-up having undergone bronchoscopy with biopsy and brushings of the RML and right VATS for drainage of a loculated right pleural effusion and talc pleurodesis on 03/01/2017. Most of the right lung was firmly adherent to the chest wall from his prior XRT. There was a loculated fluid collection at the apex of the chest that was sealed in by the lung and could not be drained. There was a large collection in the lower pleural space that was drained and talc pleurodesis performed. Unfortunately the right lower lobe was partially collapsed and chronically scarred and would not expand to fill in the space formed after draining the effusion. He also has an EF of 20-25% and has been seen by Dr. Aundra Dubin for heart failure management. He was seen on 04/22/2017 and was not felt to be volume overloaded. His meds were adjusted. He reports that he was recently treated for some pneumonia with prednisone and an antibiotic. He still reports a nagging cough with minimal sputum. He notices that if he lays on his left side he breaths fine but laying on his right side causes wheezing.   Current Outpatient Prescriptions  Medication Sig Dispense Refill  . acetaminophen (TYLENOL) 500 MG tablet Take 500 mg by mouth every 6 (six) hours as needed for mild pain.    Marland Kitchen albuterol (PROVENTIL) (2.5 MG/3ML) 0.083% nebulizer solution Take 3 mLs (2.5 mg total) by nebulization 3 (three) times daily. 270 mL 11  . aspirin EC 81 MG tablet Take 81 mg by mouth daily before breakfast.     . budesonide-formoterol (SYMBICORT) 160-4.5 MCG/ACT inhaler Inhale 2 puffs into the lungs 2 (two) times daily.    . carvedilol (COREG) 6.25 MG tablet Take 1 tablet (6.25 mg total) by mouth 2 (two) times daily. 60 tablet 3  . clopidogrel (PLAVIX) 75 MG tablet TAKE ONE TABLET BY MOUTH DAILY. 90 tablet 2  . Ferrous Sulfate (IRON) 28 MG TABS Take 28 mg by mouth daily.    . furosemide (LASIX) 40 MG tablet TAKE  ONE TABLET BY MOUTH DAILY. (Patient taking differently: TAKE ONE TABLET BY MOUTH DAILY BEFORE BREAKFAST.) 30 tablet 3  . GuaiFENesin (MUCUS RELIEF ADULT PO) Take 2 capsules by mouth 3 (three) times daily.    . Multiple Vitamins-Minerals (CENTRUM SILVER ADULT 50+) TABS Take 1 tablet by mouth daily with lunch.     Marland Kitchen NITROSTAT 0.4 MG SL tablet PLACE 1 TAB UNDER TONGUE EVERY 5 MIN IF NEEDED FOR CHEST PAIN. MAY USE 3 TIMES.NO RELIEF CALL 911. 25 tablet 1  . omeprazole (PRILOSEC) 20 MG capsule TAKE ONE CAPSULE BY MOUTH DAILY. 90 capsule 0  . Probiotic Product (FLORAJEN3 PO) Take 1 tablet by mouth daily.    . sacubitril-valsartan (ENTRESTO) 24-26 MG Take 1 tablet by mouth 2 (two) times daily. 60 tablet 3  . simvastatin (ZOCOR) 80 MG tablet Take 0.5 tablets (40 mg total) by mouth at bedtime. 15 tablet 5  . VENTOLIN HFA 108 (90 Base) MCG/ACT inhaler INHALE 2 PUFFS BY MOUTH EVERY 4 TO 6 HOURS AS NEEDED FOR WHEEZING. 18 g 0   No current facility-administered medications for this visit.      Physical Exam:  BP (!) 90/52 (BP Location: Right Arm, Patient Position: Sitting, Cuff Size: Large)   Pulse 88   Resp 16   Ht 5\' 10"  (1.778 m)   Wt 180 lb (81.6 kg)   SpO2 95% Comment: ON RA  BMI 25.83 kg/m  He looks well Lung exam shows decreased breath sounds at the right base  The incisions are well-healed  Diagnostic Tests:  CLINICAL DATA:  Right lung cancer diagnosed 3/17. Chemotherapy and radiation therapy complete. Cough for 2 weeks.  EXAM: CT CHEST WITH CONTRAST  TECHNIQUE: Multidetector CT imaging of the chest was performed during intravenous contrast administration.  CONTRAST:  34mL ISOVUE-300 IOPAMIDOL (ISOVUE-300) INJECTION 61%  COMPARISON:  04/24/2017 radiograph.  Most recent CT 02/13/2017  FINDINGS: Cardiovascular: Aortic and branch vessel atherosclerosis. Mild cardiomegaly, without pericardial effusion. No central pulmonary embolism, on this non-dedicated  study.  Mediastinum/Nodes: Similar small low left jugular nodes. Subcarinal node measures 12 mm on image 60/ series 2, similar. No hilar adenopathy. Tiny hiatal hernia. Right internal mammary node is upper normal to mildly enlarged at 6 mm on image 67/series 2, similar.  Lungs/Pleura: Loculated right-sided pleural fluid superiorly, similar. Small inferior right-sided hydropneumothorax is not significantly changed. There are calcifications within the pleural space which are new.  Probable secretions in the bronchus intermedius. Volume loss in the right hemithorax. Similar distribution of medial right apical consolidation and bronchiectasis, likely radiation induced. More mild radiation induced fibrosis extends into the medial right upper and right middle lobes.  Minimal anterior left upper lobe subpleural presumed radiation fibrosis.  Again identified is relatively diffuse pattern of ill-defined pulmonary micronodularity. This is progressive, especially in the left upper lobe.  Upper Abdomen: Cholecystectomy. Normal imaged portions of the liver, spleen, pancreas, adrenal glands, kidneys. Proximal gastric underdistention. Greater curvature wall thickening is at least partially felt to be secondary.  Musculoskeletal: Interval T4 compression deformity, mild-to-moderate. Increased density within this vertebral body. No ventral canal encroachment. No lesion in this area on the prior exam.  IMPRESSION: 1. Similar radiation fibrosis within the paramediastinal right lung. 2. Increase in ill-defined micro nodularity bilaterally. Favor infection, including atypical etiologies. 3. Interval T4 compression deformity, favored to be posttraumatic. No metastasis identified in this region on the prior CT. 4.  Aortic Atherosclerosis (ICD10-I70.0). 5. Loculated right-sided pleural fluid and inferior right hydropneumothorax, similar. 6. Tiny hiatal hernia. Gastric underdistention with  possible gastric wall thickening suggestive of gastritis. 7. Similar borderline subcarinal adenopathy. 8.  Emphysema (ICD10-J43.9).   Electronically Signed   By: Abigail Miyamoto M.D.   On: 05/21/2017 11:38  Impression:  He has a small residual loculated fluid collection in the right pleural space at the apex and at the base. The one at the apex I could not drain because the upper lobe was completely adherent to the chest wall in that location from his prior XRT. The inferior collection is re-accumulation in the space remaining due to chronic compressive atelectasis and scarring in the right lower lobe. This is probably not going to cause him much problem and is smaller than on his last CT and hopefully will gradually shrink since I performed talc pleurodesis. I suspect that his residual pulmonary symptoms are likely due to his COPD and the residual effects of XRT. His low EF may be contributing although he does not appear volume overloaded. I don't think there is any need for further intervention at this time. I reviewed the CT images with him and answered his questions.  Plan:  He will continue to follow up with Dr. Julien Nordmann, Dr. Aundra Dubin, Dr. Lenna Gilford, and Dr. Wolfgang Phoenix. I will be happy to reevaluate him if the need arises.   Gaye Pollack, MD [facility] [phone]

## 2017-05-23 ENCOUNTER — Encounter: Payer: Self-pay | Admitting: *Deleted

## 2017-05-24 ENCOUNTER — Ambulatory Visit (INDEPENDENT_AMBULATORY_CARE_PROVIDER_SITE_OTHER): Payer: Medicare Other | Admitting: Family Medicine

## 2017-05-24 ENCOUNTER — Encounter: Payer: Self-pay | Admitting: Vascular Surgery

## 2017-05-24 ENCOUNTER — Ambulatory Visit (INDEPENDENT_AMBULATORY_CARE_PROVIDER_SITE_OTHER): Payer: Medicare Other | Admitting: Vascular Surgery

## 2017-05-24 ENCOUNTER — Ambulatory Visit (INDEPENDENT_AMBULATORY_CARE_PROVIDER_SITE_OTHER)
Admission: RE | Admit: 2017-05-24 | Discharge: 2017-05-24 | Disposition: A | Discharge status: Home / Self Care | Payer: Medicare Other | Source: Ambulatory Visit | Attending: Vascular Surgery | Admitting: Vascular Surgery

## 2017-05-24 ENCOUNTER — Ambulatory Visit (HOSPITAL_COMMUNITY)
Admission: RE | Admit: 2017-05-24 | Discharge: 2017-05-24 | Disposition: A | Discharge status: Home / Self Care | Payer: Medicare Other | Source: Ambulatory Visit | Attending: Vascular Surgery | Admitting: Vascular Surgery

## 2017-05-24 ENCOUNTER — Encounter: Payer: Self-pay | Admitting: Family Medicine

## 2017-05-24 VITALS — BP 110/60 | Temp 97.7°F | Ht 70.0 in | Wt 177.0 lb

## 2017-05-24 VITALS — BP 109/69 | HR 100 | Temp 97.4°F | Resp 20 | Ht 70.0 in | Wt 174.0 lb

## 2017-05-24 DIAGNOSIS — J181 Lobar pneumonia, unspecified organism: Secondary | ICD-10-CM | POA: Diagnosis not present

## 2017-05-24 DIAGNOSIS — I714 Abdominal aortic aneurysm, without rupture, unspecified: Secondary | ICD-10-CM

## 2017-05-24 DIAGNOSIS — I6523 Occlusion and stenosis of bilateral carotid arteries: Secondary | ICD-10-CM

## 2017-05-24 DIAGNOSIS — I7771 Dissection of carotid artery: Secondary | ICD-10-CM | POA: Diagnosis not present

## 2017-05-24 DIAGNOSIS — Z9889 Other specified postprocedural states: Secondary | ICD-10-CM | POA: Diagnosis not present

## 2017-05-24 DIAGNOSIS — J189 Pneumonia, unspecified organism: Secondary | ICD-10-CM

## 2017-05-24 LAB — VAS US CAROTID
LCCADDIAS: -20 cm/s
LCCAPDIAS: 11 cm/s
LCCAPSYS: 85 cm/s
LEFT ECA DIAS: 4 cm/s
LEFT VERTEBRAL DIAS: -11 cm/s
LICADDIAS: -17 cm/s
LICAPSYS: -236 cm/s
Left CCA dist sys: -86 cm/s
Left ICA dist sys: -62 cm/s
Left ICA prox dias: -38 cm/s
RCCAPSYS: 89 cm/s
RIGHT CCA MID DIAS: -12 cm/s
RIGHT ECA DIAS: -4 cm/s
RIGHT VERTEBRAL DIAS: -20 cm/s
Right CCA prox dias: 8 cm/s
Right cca dist sys: -63 cm/s

## 2017-05-24 MED ORDER — AZITHROMYCIN 250 MG PO TABS: ORAL_TABLET | ORAL | 0 refills | Status: DC

## 2017-05-24 MED ORDER — AMOXICILLIN-POT CLAVULANATE 875-125 MG PO TABS: 1.0000 | ORAL_TABLET | Freq: Two times a day (BID) | ORAL | 0 refills | Status: DC

## 2017-05-24 NOTE — Progress Notes (Signed)
Patient ID: Paul Sandoval, male   DOB: [birth year], 76 y.o.   MRN: [id number]  Reason for Consult: AAA (1 yr f/u Bil Carotid, EVAR) and Carotid   Referred by [name], MD  Subjective:     HPI:  Paul Sandoval is a 76 y.o. male previous history of endovascular repair of abdominal aorta. He is also followed for his carotid artery disease which has been stable. He never has history of stroke TIA or amaurosis. He has no back back or abdominal pain. He missed his appointment last year with multiple other medical issues. He was unfortunately diagnosis with the right lung cancer for which she is undergone radiation and had severe, location from that is also undergoing chemotherapy. He does remain very positive in his attitude with no complaints today.  Past Medical History:  Diagnosis Date  . AAA (abdominal aortic aneurysm) ([place]) 2010   4.4 cm 08/2008;4.44 in 7/10 and 4.65 in 08/2009; 4.8 by CT in 11/2009; 4.3 by ultrasound in 08/2010  . Anemia   . Arteriosclerotic cardiovascular disease (ASCVD) 1996   CABG-1996  . Arthritis    "fingers" (03/18/2014)  . CAD (coronary artery disease)    03/18/14:  PCI with DES to distal left main. 7/29: DES to the SVG to Diag  . Cancer ([place])    Upper right lobe lung cancer  . Cardiomyopathy, ischemic    Echo 03/17/14: EF 45-50%  . Chronic bronchitis ([place])   . Chronic kidney disease    CRF  . Chronic rhinitis   . Colonic polyp 2002   polypectomy in 2002  . COPD (chronic obstructive pulmonary disease) ([place])   . Diverticulosis   . Dyspnea    with exertion  . ED (erectile dysfunction)   . Encounter for antineoplastic chemotherapy 12/19/2015  . GERD (gastroesophageal reflux disease)   . History of blood transfusion   . Hyperlipidemia   . Hypertension   . IFG (impaired fasting glucose)   . Myocardial infarction [facility])    "told h/o silent MI sometime before 1996"  . Pneumonia ~ 2001; ~ 2005    has had more than twice  . Right bundle branch block    . Tobacco abuse, in remission    40 pack year total consumption; discontinued in 1996   Family History  Problem Relation Age of Onset  . Heart disease Father   . Cancer Father        Lung  . Arthritis Mother   . Parkinsonism Mother   . Arthritis Sister        Brother with rheumatoid arthritis  . Hypertension Brother   . Colon cancer Neg Hx   . Colon polyps Neg Hx    Past Surgical History:  Procedure Laterality Date  . ABDOMINAL AORTIC ANEURYSM REPAIR  11/2012  . ABDOMINAL AORTIC ENDOVASCULAR STENT GRAFT N/A 12/11/2012   Procedure: ABDOMINAL AORTIC ENDOVASCULAR STENT GRAFT;  Surgeon: Mal Misty, MD;  Location: [place];  Service: Vascular;  Laterality: N/A;  Ultrasound guided; [place]  . CARDIAC CATHETERIZATION  01/08/1995  . COLONOSCOPY  2002   polypectomy-patient denies  . CORONARY ANGIOPLASTY WITH STENT PLACEMENT  03/18/2014   "1"  . CORONARY ANGIOPLASTY WITH STENT PLACEMENT  03/24/2014   "1"  . CORONARY ARTERY BYPASS GRAFT  01/09/1995   "CABG X3"  . FLEXIBLE BRONCHOSCOPY N/A 03/01/2017   Procedure: FLEXIBLE BRONCHOSCOPY WITH BIOPSIES;  Surgeon: Gaye Pollack, MD;  Location: [place];  Service: Thoracic;  Laterality: N/A;  .  JOINT REPLACEMENT    . LAPAROSCOPIC CHOLECYSTECTOMY  12/2009  . LEFT AND RIGHT HEART CATHETERIZATION WITH CORONARY/GRAFT ANGIOGRAM N/A 03/18/2014   Procedure: LEFT AND RIGHT HEART CATHETERIZATION WITH Beatrix Fetters;  Surgeon: Blane Ohara, MD;  Location: [facility] CATH LAB;  Service: Cardiovascular;  Laterality: N/A;  . PERCUTANEOUS CORONARY STENT INTERVENTION (PCI-S)  03/18/2014   Procedure: PERCUTANEOUS CORONARY STENT INTERVENTION (PCI-S);  Surgeon: Blane Ohara, MD;  Location: [facility] CATH LAB;  Service: Cardiovascular;;  . PERCUTANEOUS CORONARY STENT INTERVENTION (PCI-S) N/A 03/24/2014   Procedure: PERCUTANEOUS CORONARY STENT INTERVENTION (PCI-S);  Surgeon: Blane Ohara, MD;  Location: [facility] CATH LAB;  Service: Cardiovascular;  Laterality: N/A;  .  PLEURAL EFFUSION DRAINAGE Right 03/01/2017   Procedure: DRAINAGE OF PLEURAL EFFUSION;  Surgeon: Gaye Pollack, MD;  Location: [place];  Service: Thoracic;  Laterality: Right;  . RIGHT/LEFT HEART CATH AND CORONARY/GRAFT ANGIOGRAPHY N/A 05/15/2017   Procedure: RIGHT/LEFT HEART CATH AND CORONARY/GRAFT ANGIOGRAPHY;  Surgeon: Larey Dresser, MD;  Location: [place] CV LAB;  Service: Cardiovascular;  Laterality: N/A;  . TALC PLEURODESIS Right 03/01/2017   Procedure: Pietro Cassis;  Surgeon: Gaye Pollack, MD;  Location: [place];  Service: Thoracic;  Laterality: Right;  . TOTAL HIP ARTHROPLASTY Left 01/21/2013   Procedure: TOTAL HIP ARTHROPLASTY ANTERIOR APPROACH;  Surgeon: Mauri Pole, MD;  Location: [place];  Service: Orthopedics;  Laterality: Left;  Marland Kitchen VIDEO ASSISTED THORACOSCOPY Right 03/01/2017   Procedure: VIDEO ASSISTED THORACOSCOPY WITH BIOPSIES;  Surgeon: Gaye Pollack, MD;  Location: [place];  Service: Thoracic;  Laterality: Right;  Marland Kitchen VIDEO BRONCHOSCOPY N/A 11/17/2015   Procedure: VIDEO BRONCHOSCOPY WITH FLUORO;  Surgeon: Rigoberto Noel, MD;  Location: [place];  Service: Cardiopulmonary;  Laterality: N/A;    Short Social History:  Social History  Substance Use Topics  . Smoking status: Former Smoker    Packs/day: 1.50    Years: 30.00    Types: Cigarettes    Start date: 12/01/1956    Quit date: 01/08/1995  . Smokeless tobacco: Never Used  . Alcohol use 0.0 oz/week     Comment: 03/18/2014 "no alacohol since 1996"    Allergies  Allergen Reactions  . Neomycin Hives    Current Outpatient Prescriptions  Medication Sig Dispense Refill  . acetaminophen (TYLENOL) 500 MG tablet Take 500 mg by mouth every 6 (six) hours as needed for mild pain.    Marland Kitchen albuterol (PROVENTIL) (2.5 MG/3ML) 0.083% nebulizer solution Take 3 mLs (2.5 mg total) by nebulization 3 (three) times daily. 270 mL 11  . aspirin EC 81 MG tablet Take 81 mg by mouth daily before breakfast.     . budesonide-formoterol  (SYMBICORT) 160-4.5 MCG/ACT inhaler Inhale 2 puffs into the lungs 2 (two) times daily.    . carvedilol (COREG) 6.25 MG tablet Take 1 tablet (6.25 mg total) by mouth 2 (two) times daily. 60 tablet 3  . clopidogrel (PLAVIX) 75 MG tablet TAKE ONE TABLET BY MOUTH DAILY. 90 tablet 2  . Ferrous Sulfate (IRON) 28 MG TABS Take 28 mg by mouth daily.    . furosemide (LASIX) 40 MG tablet Take 40 mg by mouth daily before breakfast.    . GuaiFENesin (MUCUS RELIEF ADULT PO) Take 2 capsules by mouth 3 (three) times daily.    . Multiple Vitamins-Minerals (CENTRUM SILVER ADULT 50+) TABS Take 1 tablet by mouth daily with lunch.     Marland Kitchen NITROSTAT 0.4 MG SL tablet PLACE 1 TAB UNDER TONGUE EVERY 5 MIN  IF NEEDED FOR CHEST PAIN. MAY USE 3 TIMES.NO RELIEF CALL 911. 25 tablet 1  . omeprazole (PRILOSEC) 20 MG capsule TAKE ONE CAPSULE BY MOUTH DAILY. 90 capsule 0  . Probiotic Product (FLORAJEN3 PO) Take 1 tablet by mouth daily.    . sacubitril-valsartan (ENTRESTO) 24-26 MG Take 1 tablet by mouth 2 (two) times daily. 60 tablet 3  . simvastatin (ZOCOR) 80 MG tablet Take 0.5 tablets (40 mg total) by mouth at bedtime. 15 tablet 5  . VENTOLIN HFA 108 (90 Base) MCG/ACT inhaler INHALE 2 PUFFS BY MOUTH EVERY 4 TO 6 HOURS AS NEEDED FOR WHEEZING. 18 g 0   No current facility-administered medications for this visit.     Review of Systems  Constitutional:  Constitutional negative. HENT: HENT negative.  Eyes: Eyes negative.  Respiratory: Positive for cough, shortness of breath and wheezing.  GI: Gastrointestinal negative.  Musculoskeletal: Musculoskeletal negative.  Skin: Skin negative.  Neurological: Neurological negative. Hematologic: Hematologic/lymphatic negative.  Psychiatric: Psychiatric negative.        Objective:  Objective   Vitals:   05/24/17 1018 05/24/17 1020  BP: 129/66 109/69  Pulse: 100   Resp: 20   Temp: (!) 97.4 F (36.3 C)   TempSrc: Oral   SpO2: 96%   Weight: 174 lb (78.9 kg)   Height: _0   (1.778 m)    Body mass index is 24.97 kg/m.  Physical Exam  Constitutional: He is oriented to person, place, and time. He appears well-developed.  HENT:  Head: Normocephalic.  Eyes: Pupils are equal, round, and reactive to light.  Cardiovascular: Normal rate.   Pulses:      Radial pulses are 2+ on the right side, and 2+ on the left side.       Femoral pulses are 2+ on the right side, and 2+ on the left side. Pulmonary/Chest: Effort normal.  Abdominal: Soft. He exhibits no mass.  Musculoskeletal: Normal range of motion. He exhibits no edema.  Neurological: He is alert and oriented to person, place, and time.  Skin: Skin is warm and dry.  Psychiatric: He has a normal mood and affect. His behavior is normal. Judgment and thought content normal.    Data: I have intentionally interpreted his abdominal aortic duplex demonstrating stable aneurysm diameter at 3.3 x 3.4 cm without elevated velocities. Next  I will also interpreted his cerebrovascular duplex which demonstrates right 40-59% and left 1-39% stenosis.     Assessment/Plan:     76 year old male with previous history of abdominal aortic repair with stenting. He is stable from that standpoint and also has carotids that have stable disease. He unfortunately has lung cancer for which she is undergoing treatment although he appears healthy and in good spirits today. We'll follow him up in 1 year with repeat carotid duplex and duplex of his aortic aneurysm. We discussed the signs and symptoms of stroke he demonstrates good understanding.     Waynetta Sandy MD [facility]

## 2017-05-24 NOTE — Progress Notes (Signed)
   Subjective:    Patient ID: Paul Sandoval, male    DOB: September 15, 1940, 76 y.o.   MRN: [id number]  Fever   This is a new problem. Episode onset: 3 -4 days. Associated symptoms include congestion, coughing and wheezing. He has tried acetaminophen for the symptoms.  This patient relates some coughing congestion wheezing he denies shortness of breath he states low-grade fever that seems to be more in the afternoon does not wake him up at night his appetites okay he states he has not in respiratory distress. He is gone through extensive testing recently including chest x-ray and CAT scan. Both of these were reviewed. Patient is currently under the care of oncology for lung cancer    Review of Systems  Constitutional: Positive for fever.  HENT: Positive for congestion.   Respiratory: Positive for cough and wheezing.       chest x-ray and CAT scan from earlier this week was reviewed Objective:   Physical Exam Neck no masses eardrums normal mucous membranes moist lungs he has rales and rhonchi in the right lower lung on the right side left side is clear hearts regular there is some scattered like wheezing he is not rest or distress is not air hungry. I do not feel repeat x-rays or lab work indicated right at this moment but if he gets worse this certainly will be. The patient was cautioned extensively that if she should start feeling worse to go to the ER. O2 saturation 96% no respiratory distress    Assessment & Plan:  Early pneumonia Continue inhalers Prednisone not indicated Double coverage antibiotics Z-Pak with Augmentin Recheck in 72 hours Patient instructed to follow-up immediately if worse or go to ER Has appointment with cardiology on Monday and also appointment with oncology on Tuesday  Will need flu vaccine once doing better

## 2017-05-27 ENCOUNTER — Encounter: Payer: Self-pay | Admitting: Family Medicine

## 2017-05-27 ENCOUNTER — Ambulatory Visit (HOSPITAL_COMMUNITY)
Admission: RE | Admit: 2017-05-27 | Discharge: 2017-05-27 | Disposition: A | Discharge status: Home / Self Care | Payer: Medicare Other | Source: Ambulatory Visit | Attending: Cardiology | Admitting: Cardiology

## 2017-05-27 ENCOUNTER — Encounter (HOSPITAL_COMMUNITY): Payer: Self-pay | Admitting: Cardiology

## 2017-05-27 ENCOUNTER — Ambulatory Visit (INDEPENDENT_AMBULATORY_CARE_PROVIDER_SITE_OTHER): Payer: Medicare Other | Admitting: Family Medicine

## 2017-05-27 VITALS — BP 115/54 | HR 93 | Wt 178.0 lb

## 2017-05-27 VITALS — BP 120/64 | Temp 98.1°F | Ht 70.0 in | Wt 177.0 lb

## 2017-05-27 DIAGNOSIS — E785 Hyperlipidemia, unspecified: Secondary | ICD-10-CM | POA: Diagnosis not present

## 2017-05-27 DIAGNOSIS — J181 Lobar pneumonia, unspecified organism: Secondary | ICD-10-CM | POA: Diagnosis not present

## 2017-05-27 DIAGNOSIS — Z8249 Family history of ischemic heart disease and other diseases of the circulatory system: Secondary | ICD-10-CM | POA: Insufficient documentation

## 2017-05-27 DIAGNOSIS — Z87891 Personal history of nicotine dependence: Secondary | ICD-10-CM | POA: Insufficient documentation

## 2017-05-27 DIAGNOSIS — I130 Hypertensive heart and chronic kidney disease with heart failure and stage 1 through stage 4 chronic kidney disease, or unspecified chronic kidney disease: Secondary | ICD-10-CM | POA: Insufficient documentation

## 2017-05-27 DIAGNOSIS — Z7982 Long term (current) use of aspirin: Secondary | ICD-10-CM | POA: Diagnosis not present

## 2017-05-27 DIAGNOSIS — Z923 Personal history of irradiation: Secondary | ICD-10-CM | POA: Diagnosis not present

## 2017-05-27 DIAGNOSIS — I5042 Chronic combined systolic (congestive) and diastolic (congestive) heart failure: Secondary | ICD-10-CM

## 2017-05-27 DIAGNOSIS — Z79899 Other long term (current) drug therapy: Secondary | ICD-10-CM | POA: Insufficient documentation

## 2017-05-27 DIAGNOSIS — J449 Chronic obstructive pulmonary disease, unspecified: Secondary | ICD-10-CM | POA: Diagnosis not present

## 2017-05-27 DIAGNOSIS — I2510 Atherosclerotic heart disease of native coronary artery without angina pectoris: Secondary | ICD-10-CM | POA: Insufficient documentation

## 2017-05-27 DIAGNOSIS — R0609 Other forms of dyspnea: Secondary | ICD-10-CM | POA: Diagnosis not present

## 2017-05-27 DIAGNOSIS — Z9221 Personal history of antineoplastic chemotherapy: Secondary | ICD-10-CM | POA: Diagnosis not present

## 2017-05-27 DIAGNOSIS — I5022 Chronic systolic (congestive) heart failure: Secondary | ICD-10-CM | POA: Insufficient documentation

## 2017-05-27 DIAGNOSIS — I255 Ischemic cardiomyopathy: Secondary | ICD-10-CM | POA: Diagnosis not present

## 2017-05-27 DIAGNOSIS — J189 Pneumonia, unspecified organism: Secondary | ICD-10-CM

## 2017-05-27 DIAGNOSIS — Z801 Family history of malignant neoplasm of trachea, bronchus and lung: Secondary | ICD-10-CM | POA: Insufficient documentation

## 2017-05-27 DIAGNOSIS — Z808 Family history of malignant neoplasm of other organs or systems: Secondary | ICD-10-CM | POA: Diagnosis not present

## 2017-05-27 DIAGNOSIS — C3490 Malignant neoplasm of unspecified part of unspecified bronchus or lung: Secondary | ICD-10-CM | POA: Diagnosis not present

## 2017-05-27 MED ORDER — SPIRONOLACTONE 25 MG PO TABS: 12.5000 mg | ORAL_TABLET | Freq: Every day | ORAL | 3 refills | Status: DC

## 2017-05-27 NOTE — Progress Notes (Signed)
   Subjective:    Patient ID: Paul Sandoval, male    DOB: 02-06-1941, 76 y.o.   MRN: [id number]  HPI Patient here today as a follow up from her Sept 28,2018 visit for pneumonia.States he feels better.  Overall breathing is better, still getting yellow phlegm and whitish up , most times coughoing  tmax of 99 in afternoon last couple day s States feeling overall a bit better. Cough still somewhat productive. Occasional wheezing. Nebulizer treatments taking care of it  occas wheeign  Recent scan result reviewed in presence of patient.  Review of Systems No headache, no major weight loss or weight gain, no chest pain no back pain abdominal pain no change in bowel habits complete ROS otherwise negative     Objective:   Physical Exam Alert vitals stable, NAD. Blood pressure good on repeat. HEENT normal. Lungs bilateral faint wheezes. No inspiratory crackles. No respiratory distress. No tachypnea. Heart regular rate and rhythm.        Assessment & Plan:  Impression dyspnea multifactorial at least 6 or 7 factors all discussed with the patient today. Maintain Zithomax and amoxicillin. Rationale sed maintain nebulizer treatments when necessary warning signs discussed. Follow-up with specialistas scheduled

## 2017-05-27 NOTE — Patient Instructions (Signed)
Start Spironolactone 12.5 mg (1/2 tab) daily  Your physician recommends that you return for lab work in: 10 days  Your physician recommends that you schedule a follow-up appointment in: 2 months

## 2017-05-27 NOTE — Progress Notes (Signed)
PCP: Dr. Wolfgang Phoenix Pulmonary: Dr. Lenna Gilford HF Cardiology: Dr. Aundra Dubin  76 yo with history of COPD, CAD s/p CABG in 1996 and PCI to dLM and SVG-D in 7/15, ischemic cardiomyopathy, and non-small cell lung cancer presents for followup of CHF and dyspnea.  He was initially referred to [place] by Dr. Lenna Gilford after fall in EF and worsening dyspnea was noted.   Patient reports that his initial symptoms prior to CABG in 1996 were dyspnea.  He has never had any worrisome chest pain.  He did well initially after CABG and was golfing regularly and walking 2-3 miles/day up until around 3/17.  He had one episode of increased dyspnea in 2015 and had cath with PCI to distal LM and SVG-D.  In 3/17, he was diagnosed with NSCLC and was treated with radiation and chemotherapy.  Since then, he has had progressive exertional dyspnea.  He was noted on echo in 7/18 to have EF down to 20-25%.    RHC/LHC was done in 9/18, showing occluded SVG-small D, patent LIMA-LAD and SVG-PDA, 50% shelf-like stenosis distal left main (no intervention), filling pressure not elevated and cardiac output preserved.   Patient denies chest pain.  No dyspnea walking on flat ground. He is short of breath walking up stairs or inclines.  No orthopnea/PND.  He has had cough/congestion recently and was started on an antibiotic by his PCP.  Weight is stable.   ECG (6/18): NSR, RBBB  Labs (9/17): LDL 90 Labs (7/18): K 3.5, creatinine 1.28, hgb 10.7 Labs (8/18): LDL 68 Labs (9/18): K 4.1, creatinine 1.2, hgb 11.1, plts 98 K  PMH: 1. COPD: PFTs (6/17) with severe obstructive airways disease.  2. AAA: s/p repair with stent graft.  3. Non-small cell lung cancer: Stage IIIA, diagnosed 3/17.  He had radiation as well as chemotherapy with carboplatin and paclitaxel.   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 PNA in 6/17 6. Recurrent right pleural effusion: VATS with talc pleurodesis on right.  No malignant cells noted.  7. HTN 8. H/o THR 9. CAD: CABG 1996 with SVG-D, SVG-PDA,  LIMA-LAD.   - LHC (7/15): Totally occluded RCA and LAD.  Severe distal LM stenosis.  Patent SVG-PDA, patent LIMA-LAD.  Severe disease SVG-D.  Patient had DES to distal left main and staged PCI of SVG-D.   - Cardiolite (7/17): EF 30%, prior infarction with no ischemia.  - LHC (9/18): occluded SVG-small D, patent LIMA-LAD and SVG-PDA, 50% shelf-like stenosis distal left main (no intervention).  10. Chronic systolic CHF: Ischemic cardiomyopathy.   - Echo (6/17): EF 30-35%. - Echo (7/18): EF 20-25%, moderate RV dilation with mildly decreased RV systolic function.  - RHC (9/18): mean RA 3, PA 28/9 mean 18, mean PCWP 10, CI 4.05.   Social History   Social History  . Marital status: Married    Spouse name: N/A  . Number of children: 1  . Years of education: N/A   Occupational History  . Retired     [employer]   Social History Main Topics  . Smoking status: Former Smoker    Packs/day: 1.50    Years: 30.00    Types: Cigarettes    Start date: 12/01/1956    Quit date: 01/08/1995  . Smokeless tobacco: Never Used  . Alcohol use 0.0 oz/week     Comment: 03/18/2014 "no alacohol since 1996"  . Drug use: No  . Sexual activity: No   Other Topics Concern  . Not on file   Social History Narrative  .  No narrative on file   Family History  Problem Relation Age of Onset  . Heart disease Father   . Cancer Father        Lung  . Arthritis Mother   . Parkinsonism Mother   . Arthritis Sister        Brother with rheumatoid arthritis  . Hypertension Brother   . Colon cancer Neg Hx   . Colon polyps Neg Hx    ROS: All systems reviewed and negative except as per HPI.   Current Outpatient Prescriptions  Medication Sig Dispense Refill  . acetaminophen (TYLENOL) 500 MG tablet Take 500 mg by mouth every 6 (six) hours as needed for mild pain.    Marland Kitchen albuterol (PROVENTIL) (2.5 MG/3ML) 0.083% nebulizer solution Take 3 mLs (2.5 mg total) by nebulization 3 (three) times daily. 270 mL 11  .  amoxicillin-clavulanate (AUGMENTIN) 875-125 MG tablet Take 1 tablet by mouth 2 (two) times daily. 20 tablet 0  . aspirin EC 81 MG tablet Take 81 mg by mouth daily before breakfast.     . azithromycin (ZITHROMAX Z-PAK) 250 MG tablet Take 2 tablets (500 mg) on  Day 1,  followed by 1 tablet (250 mg) once daily on Days 2 through 5. 6 each 0  . budesonide-formoterol (SYMBICORT) 160-4.5 MCG/ACT inhaler Inhale 2 puffs into the lungs 2 (two) times daily.    . carvedilol (COREG) 6.25 MG tablet Take 1 tablet (6.25 mg total) by mouth 2 (two) times daily. 60 tablet 3  . clopidogrel (PLAVIX) 75 MG tablet TAKE ONE TABLET BY MOUTH DAILY. 90 tablet 2  . Ferrous Sulfate (IRON) 28 MG TABS Take 28 mg by mouth daily.    . furosemide (LASIX) 40 MG tablet Take 40 mg by mouth daily before breakfast.    . GuaiFENesin (MUCUS RELIEF ADULT PO) Take 2 capsules by mouth 3 (three) times daily.    . Multiple Vitamins-Minerals (CENTRUM SILVER ADULT 50+) TABS Take 1 tablet by mouth daily with lunch.     Marland Kitchen NITROSTAT 0.4 MG SL tablet PLACE 1 TAB UNDER TONGUE EVERY 5 MIN IF NEEDED FOR CHEST PAIN. MAY USE 3 TIMES.NO RELIEF CALL 911. 25 tablet 1  . omeprazole (PRILOSEC) 20 MG capsule TAKE ONE CAPSULE BY MOUTH DAILY. 90 capsule 0  . Probiotic Product (FLORAJEN3 PO) Take 1 tablet by mouth daily.    . sacubitril-valsartan (ENTRESTO) 24-26 MG Take 1 tablet by mouth 2 (two) times daily. 60 tablet 3  . simvastatin (ZOCOR) 80 MG tablet Take 0.5 tablets (40 mg total) by mouth at bedtime. 15 tablet 5  . VENTOLIN HFA 108 (90 Base) MCG/ACT inhaler INHALE 2 PUFFS BY MOUTH EVERY 4 TO 6 HOURS AS NEEDED FOR WHEEZING. 18 g 0  . spironolactone (ALDACTONE) 25 MG tablet Take 0.5 tablets (12.5 mg total) by mouth daily. 15 tablet 3   No current facility-administered medications for this encounter.    BP (!) 115/54   Pulse 93   Wt 178 lb (80.7 kg)   SpO2 98%   BMI 25.54 kg/m  General: NAD Neck: No JVD, no thyromegaly or thyroid nodule.  Lungs:  Rhonchi bilaterally.  CV: Nondisplaced PMI.  Heart regular S1/S2 with widely split S2, no S3/S4, no murmur.  No peripheral edema.  No carotid bruit.  Normal pedal pulses.  Abdomen: Soft, nontender, no hepatosplenomegaly, no distention.  Skin: Intact without lesions or rashes.  Neurologic: Alert and oriented x 3.  Psych: Normal affect. Extremities: No clubbing or cyanosis.  HEENT:  Normal.   Assessment/Plan: 1. Chronic systolic CHF: Ischemic cardiomyopathy.  He has had a steady fall in EF on imaging studies, most recently had echo in 7/18 with EF 20-25%.  NYHA class III symptoms with worsened dyspnea over the last year.  On exam, he is not volume overloaded. 9/18 RHC showed normal filling pressures - Continue Coreg 6.25 mg bid. - Continue Entresto 24/26 bid.    - Start spironolactone 12.5 mg daily.  BMET 10 days. 2. CAD: s/p CABG then PCI 7/15 to distal LM and SVG-D.  Anginal equivalent in the past appears to have been dyspnea, he has never had significant chest pain.  LHC (9/18) showed patent LIMA-LAD and SVG-PDA; occluded SVG-D; 50% shelf-like LM stenosis - Continue ASA 81, Plavix, and simvastatin.  3. COPD: Severe COPD by PFTs in 6/17.  Based on the recent RHC/LHC, I suspect that COPD plays a major role in his ongoing dyspnea.   4. Hyperlipidemia: Good lipids in 8/18.  5. NSCLC: Follows with Dr. Earlie Server.  He has finished chemo/radiation and will get scans to assess for residual disease. He does not appear to have had any significantly cardiotoxic agents with chemotherapy.  6. Exertional dyspnea: With normal filling pressures on RHC, I think that COPD plays a major role in his dyspnea.   Followup in 2 months.   Loralie Champagne 05/27/2017

## 2017-05-28 ENCOUNTER — Telehealth: Payer: Self-pay | Admitting: Internal Medicine

## 2017-05-28 ENCOUNTER — Ambulatory Visit (HOSPITAL_BASED_OUTPATIENT_CLINIC_OR_DEPARTMENT_OTHER): Payer: Medicare Other | Admitting: Internal Medicine

## 2017-05-28 ENCOUNTER — Encounter: Payer: Self-pay | Admitting: Internal Medicine

## 2017-05-28 VITALS — BP 129/51 | HR 93 | Temp 97.6°F | Resp 18 | Ht 70.0 in | Wt 177.6 lb

## 2017-05-28 DIAGNOSIS — C3411 Malignant neoplasm of upper lobe, right bronchus or lung: Secondary | ICD-10-CM

## 2017-05-28 NOTE — Progress Notes (Signed)
Paul Sandoval [phone]   [phone]  OFFICE PROGRESS NOTE  Paul Kirschner, MD [address] [place] [address]  DIAGNOSIS: Stage IIIA (T2b, N2, M0) non-small cell lung cancer, favoring squamous cell carcinoma presented with right upper lobe lung mass in addition to mediastinal lymphadenopathy diagnosed in March 2017.  PRIOR THERAPY:  A course of concurrent chemoradiation with weekly carboplatin for AUC of 2 and paclitaxel 45 MG/M2. Status post 5 cycle with partial response.  CURRENT THERAPY: Observation  INTERVAL HISTORY: Paul Sandoval 76 y.o. male returns to the clinic today for follow-up visit. The patient is feeling fine today with no specific complaints except for mild cough and shortness of breath. He was recently treated for pneumonia by his primary care physician. He denied having any chest pain or hemoptysis. He denied having any fever or chills. He has no nausea, vomiting, diarrhea or constipation. He has no weight loss or night sweats. The patient had repeat CT scan of the chest performed recently and he is here for evaluation and discussion of his scan results.  MEDICAL HISTORY: Past Medical History:  Diagnosis Date  . AAA (abdominal aortic aneurysm) ([place]) 2010   4.4 cm 08/2008;4.44 in 7/10 and 4.65 in 08/2009; 4.8 by CT in 11/2009; 4.3 by ultrasound in 08/2010  . Anemia   . Arteriosclerotic cardiovascular disease (ASCVD) 1996   CABG-1996  . Arthritis    "fingers" (03/18/2014)  . CAD (coronary artery disease)    03/18/14:  PCI with DES to distal left main. 7/29: DES to the SVG to Diag  . Cancer ([place])    Upper right lobe lung cancer  . Cardiomyopathy, ischemic    Echo 03/17/14: EF 45-50%  . Chronic bronchitis ([place])   . Chronic kidney disease    CRF  . Chronic rhinitis   . Colonic polyp 2002   polypectomy in 2002  . COPD (chronic obstructive pulmonary disease) ([place])   . Diverticulosis   . Dyspnea    with exertion   . ED (erectile dysfunction)   . Encounter for antineoplastic chemotherapy 12/19/2015  . GERD (gastroesophageal reflux disease)   . History of blood transfusion   . Hyperlipidemia   . Hypertension   . IFG (impaired fasting glucose)   . Myocardial infarction [facility])    "told h/o silent MI sometime before 1996"  . Pneumonia ~ 2001; ~ 2005    has had more than twice  . Right bundle branch block   . Tobacco abuse, in remission    40 pack year total consumption; discontinued in 1996    ALLERGIES:  is allergic to neomycin.  MEDICATIONS:  Current Outpatient Prescriptions  Medication Sig Dispense Refill  . acetaminophen (TYLENOL) 500 MG tablet Take 500 mg by mouth every 6 (six) hours as needed for mild pain.    Marland Kitchen albuterol (PROVENTIL) (2.5 MG/3ML) 0.083% nebulizer solution Take 3 mLs (2.5 mg total) by nebulization 3 (three) times daily. 270 mL 11  . amoxicillin-clavulanate (AUGMENTIN) 875-125 MG tablet Take 1 tablet by mouth 2 (two) times daily. 20 tablet 0  . aspirin EC 81 MG tablet Take 81 mg by mouth daily before breakfast.     . budesonide-formoterol (SYMBICORT) 160-4.5 MCG/ACT inhaler Inhale 2 puffs into the lungs 2 (two) times daily.    . carvedilol (COREG) 6.25 MG tablet Take 1 tablet (6.25 mg total) by mouth 2 (two) times daily. 60 tablet 3  . clopidogrel (PLAVIX) 75 MG  tablet TAKE ONE TABLET BY MOUTH DAILY. 90 tablet 2  . Ferrous Sulfate (IRON) 28 MG TABS Take 28 mg by mouth daily.    . furosemide (LASIX) 40 MG tablet Take 40 mg by mouth daily before breakfast.    . GuaiFENesin (MUCUS RELIEF ADULT PO) Take 2 capsules by mouth 3 (three) times daily.    . Multiple Vitamins-Minerals (CENTRUM SILVER ADULT 50+) TABS Take 1 tablet by mouth daily with lunch.     Marland Kitchen omeprazole (PRILOSEC) 20 MG capsule TAKE ONE CAPSULE BY MOUTH DAILY. 90 capsule 0  . Probiotic Product (FLORAJEN3 PO) Take 1 tablet by mouth daily.    . sacubitril-valsartan (ENTRESTO) 24-26 MG Take 1 tablet by mouth 2 (two) times  daily. 60 tablet 3  . simvastatin (ZOCOR) 80 MG tablet Take 0.5 tablets (40 mg total) by mouth at bedtime. 15 tablet 5  . spironolactone (ALDACTONE) 25 MG tablet Take 0.5 tablets (12.5 mg total) by mouth daily. 15 tablet 3  . VENTOLIN HFA 108 (90 Base) MCG/ACT inhaler INHALE 2 PUFFS BY MOUTH EVERY 4 TO 6 HOURS AS NEEDED FOR WHEEZING. 18 g 0  . NITROSTAT 0.4 MG SL tablet PLACE 1 TAB UNDER TONGUE EVERY 5 MIN IF NEEDED FOR CHEST PAIN. MAY USE 3 TIMES.NO RELIEF CALL 911. (Patient not taking: Reported on 05/28/2017) 25 tablet 1   No current facility-administered medications for this visit.     SURGICAL HISTORY:  Past Surgical History:  Procedure Laterality Date  . ABDOMINAL AORTIC ANEURYSM REPAIR  11/2012  . ABDOMINAL AORTIC ENDOVASCULAR STENT GRAFT N/A 12/11/2012   Procedure: ABDOMINAL AORTIC ENDOVASCULAR STENT GRAFT;  Surgeon: Mal Misty, MD;  Location: [facility];  Service: Vascular;  Laterality: N/A;  Ultrasound guided; [place]  . CARDIAC CATHETERIZATION  01/08/1995  . COLONOSCOPY  2002   polypectomy-patient denies  . CORONARY ANGIOPLASTY WITH STENT PLACEMENT  03/18/2014   "1"  . CORONARY ANGIOPLASTY WITH STENT PLACEMENT  03/24/2014   "1"  . CORONARY ARTERY BYPASS GRAFT  01/09/1995   "CABG X3"  . FLEXIBLE BRONCHOSCOPY N/A 03/01/2017   Procedure: FLEXIBLE BRONCHOSCOPY WITH BIOPSIES;  Surgeon: Gaye Pollack, MD;  Location: [facility] OR;  Service: Thoracic;  Laterality: N/A;  . JOINT REPLACEMENT    . LAPAROSCOPIC CHOLECYSTECTOMY  12/2009  . LEFT AND RIGHT HEART CATHETERIZATION WITH CORONARY/GRAFT ANGIOGRAM N/A 03/18/2014   Procedure: LEFT AND RIGHT HEART CATHETERIZATION WITH Beatrix Fetters;  Surgeon: Blane Ohara, MD;  Location: [facility] CATH LAB;  Service: Cardiovascular;  Laterality: N/A;  . PERCUTANEOUS CORONARY STENT INTERVENTION (PCI-S)  03/18/2014   Procedure: PERCUTANEOUS CORONARY STENT INTERVENTION (PCI-S);  Surgeon: Blane Ohara, MD;  Location: [facility] CATH LAB;  Service: Cardiovascular;;  .  PERCUTANEOUS CORONARY STENT INTERVENTION (PCI-S) N/A 03/24/2014   Procedure: PERCUTANEOUS CORONARY STENT INTERVENTION (PCI-S);  Surgeon: Blane Ohara, MD;  Location: [facility] CATH LAB;  Service: Cardiovascular;  Laterality: N/A;  . PLEURAL EFFUSION DRAINAGE Right 03/01/2017   Procedure: DRAINAGE OF PLEURAL EFFUSION;  Surgeon: Gaye Pollack, MD;  Location: [facility];  Service: Thoracic;  Laterality: Right;  . RIGHT/LEFT HEART CATH AND CORONARY/GRAFT ANGIOGRAPHY N/A 05/15/2017   Procedure: RIGHT/LEFT HEART CATH AND CORONARY/GRAFT ANGIOGRAPHY;  Surgeon: Larey Dresser, MD;  Location: [facility] CV LAB;  Service: Cardiovascular;  Laterality: N/A;  . TALC PLEURODESIS Right 03/01/2017   Procedure: Pietro Cassis;  Surgeon: Gaye Pollack, MD;  Location: [place] [facility];  Service: Thoracic;  Laterality: Right;  . TOTAL HIP ARTHROPLASTY Left 01/21/2013  Procedure: TOTAL HIP ARTHROPLASTY ANTERIOR APPROACH;  Surgeon: Mauri Pole, MD;  Location: [place];  Service: Orthopedics;  Laterality: Left;  Marland Kitchen VIDEO ASSISTED THORACOSCOPY Right 03/01/2017   Procedure: VIDEO ASSISTED THORACOSCOPY WITH BIOPSIES;  Surgeon: Gaye Pollack, MD;  Location: [facility];  Service: Thoracic;  Laterality: Right;  Marland Kitchen VIDEO BRONCHOSCOPY N/A 11/17/2015   Procedure: VIDEO BRONCHOSCOPY WITH FLUORO;  Surgeon: Rigoberto Noel, MD;  Location: [facility];  Service: Cardiopulmonary;  Laterality: N/A;    REVIEW OF SYSTEMS:  A comprehensive review of systems was negative except for: Respiratory: positive for cough, dyspnea on exertion and sputum   PHYSICAL EXAMINATION: General appearance: alert, cooperative, fatigued and no distress Head: Normocephalic, without obvious abnormality, atraumatic Neck: no adenopathy, no JVD, supple, symmetrical, trachea midline and thyroid not enlarged, symmetric, no tenderness/mass/nodules Lymph nodes: Cervical, supraclavicular, and axillary nodes normal. Resp: wheezes RLL Back: symmetric, no curvature. ROM normal. No CVA  tenderness. Cardio: regular rate and rhythm, S1, S2 normal, no murmur, click, rub or gallop GI: soft, non-tender; bowel sounds normal; no masses,  no organomegaly Extremities: extremities normal, atraumatic, no cyanosis or edema  ECOG PERFORMANCE STATUS: 1 - Symptomatic but completely ambulatory  Blood pressure (!) 129/51, pulse 93, temperature 97.6 F (36.4 C), temperature source Oral, resp. rate 18, height _0  (1.778 m), weight 177 lb 9.6 oz (80.6 kg), SpO2 97 %.  LABORATORY DATA: Lab Results  Component Value Date   WBC 7.1 05/21/2017   HGB 11.1 (L) 05/21/2017   HCT 36.0 (L) 05/21/2017   MCV 86.5 05/21/2017   PLT 98 (L) 05/21/2017      Chemistry      Component Value Date/Time   NA 137 05/21/2017 0906   K 4.1 05/21/2017 0906   CL 100 (L) 05/15/2017 0807   CO2 29 05/21/2017 0906   BUN 14.6 05/21/2017 0906   CREATININE 1.2 05/21/2017 0906      Component Value Date/Time   CALCIUM 9.4 05/21/2017 0906   ALKPHOS 86 05/21/2017 0906   AST 19 05/21/2017 0906   ALT 14 05/21/2017 0906   BILITOT 0.57 05/21/2017 0906       RADIOGRAPHIC STUDIES: Dg Chest 2 View  Result Date: 05/22/2017 CLINICAL DATA:  Known right lung malignancy. The patient underwent BA TTS on July 6 for pleural effusion drainage and talc pleurodesis. The patient is now complaining of wheezing and cough. History of COPD and coronary artery disease. EXAM: CHEST  2 VIEW COMPARISON:  CT scan chest of May 21, 2017 FINDINGS: The lungs are adequately inflated. There is stable volume loss on the right with consolidation of the right pulmonary apex. A small to moderate-sized right pleural effusion is present with loculated air and fluid collections within it. The cardiac silhouette is normal. The pulmonary vascularity is not engorged. The patient has undergone previous CABG. There is calcification in the wall of the aortic arch. IMPRESSION: Moderate size right pleural effusion with foci of loculated air little changed  from yesterday's CT scan. Stable soft tissue fullness in the right hilar region and stable right upper hemithorax opacity. Normal appearing left lung. Electronically Signed   By: David  Martinique M.D.   On: 05/22/2017 10:48   Ct Chest W Contrast  Result Date: 05/21/2017 CLINICAL DATA:  Right lung cancer diagnosed 3/17. Chemotherapy and radiation therapy complete. Cough for 2 weeks. EXAM: CT CHEST WITH CONTRAST TECHNIQUE: Multidetector CT imaging of the chest was performed during intravenous contrast administration. CONTRAST:  60m ISOVUE-300 IOPAMIDOL (ISOVUE-300) INJECTION  61% COMPARISON:  04/24/2017 radiograph.  Most recent CT 02/13/2017 FINDINGS: Cardiovascular: Aortic and branch vessel atherosclerosis. Mild cardiomegaly, without pericardial effusion. No central pulmonary embolism, on this non-dedicated study. Mediastinum/Nodes: Similar small low left jugular nodes. Subcarinal node measures 12 mm on image 60/ series 2, similar. No hilar adenopathy. Tiny hiatal hernia. Right internal mammary node is upper normal to mildly enlarged at 6 mm on image 67/series 2, similar. Lungs/Pleura: Loculated right-sided pleural fluid superiorly, similar. Small inferior right-sided hydropneumothorax is not significantly changed. There are calcifications within the pleural space which are new. Probable secretions in the bronchus intermedius. Volume loss in the right hemithorax. Similar distribution of medial right apical consolidation and bronchiectasis, likely radiation induced. More mild radiation induced fibrosis extends into the medial right upper and right middle lobes. Minimal anterior left upper lobe subpleural presumed radiation fibrosis. Again identified is relatively diffuse pattern of ill-defined pulmonary micronodularity. This is progressive, especially in the left upper lobe. Upper Abdomen: Cholecystectomy. Normal imaged portions of the liver, spleen, pancreas, adrenal glands, kidneys. Proximal gastric  underdistention. Greater curvature wall thickening is at least partially felt to be secondary. Musculoskeletal: Interval T4 compression deformity, mild-to-moderate. Increased density within this vertebral body. No ventral canal encroachment. No lesion in this area on the prior exam. IMPRESSION: 1. Similar radiation fibrosis within the paramediastinal right lung. 2. Increase in ill-defined micro nodularity bilaterally. Favor infection, including atypical etiologies. 3. Interval T4 compression deformity, favored to be posttraumatic. No metastasis identified in this region on the prior CT. 4.  Aortic Atherosclerosis (ICD10-I70.0). 5. Loculated right-sided pleural fluid and inferior right hydropneumothorax, similar. 6. Tiny hiatal hernia. Gastric underdistention with possible gastric wall thickening suggestive of gastritis. 7. Similar borderline subcarinal adenopathy. 8.  Emphysema (ICD10-J43.9). Electronically Signed   By: Abigail Miyamoto M.D.   On: 05/21/2017 11:38    ASSESSMENT AND PLAN:  This is a very pleasant 76 years old white male with a stage IIIa non-small cell lung cancer status post a course of concurrent chemoradiation with weekly carboplatin and paclitaxel and he had a rough time with the treatment at that time. He did not receive consolidation chemotherapy. The patient is doing fine today with no specific complaints except for mild cough and shortness breath with exertion. He is recovering from recent pneumonia. His recent CT scan of the chest showed no clear evidence for disease progression but residual effect from the previous pneumonia. I discussed the scan results with the patient today. I recommended for him to continue on observation with repeat CT scan of the chest in 6 months for restaging of his disease. The patient was advised to call immediately if he has any concerning symptoms in the interval. The patient voices understanding of current disease status and treatment options and is in  agreement with the current care plan.  All questions were answered. The patient knows to call the clinic with any problems, questions or concerns. We can certainly see the patient much sooner if necessary. I spent 10 minutes counseling the patient face to face. The total time spent in the appointment was 15 minutes.  Disclaimer: This note was dictated with voice recognition software. Similar sounding words can inadvertently be transcribed and may not be corrected upon review.

## 2017-05-28 NOTE — Telephone Encounter (Signed)
Gave avs and calendar for April 2019 °

## 2017-05-29 ENCOUNTER — Encounter: Payer: Medicare Other | Admitting: Surgery

## 2017-06-06 ENCOUNTER — Encounter: Payer: Self-pay | Admitting: Family Medicine

## 2017-06-06 ENCOUNTER — Ambulatory Visit (HOSPITAL_COMMUNITY)
Admission: RE | Admit: 2017-06-06 | Discharge: 2017-06-06 | Disposition: A | Discharge status: Home / Self Care | Payer: Medicare Other | Source: Ambulatory Visit | Attending: Cardiology | Admitting: Cardiology

## 2017-06-06 ENCOUNTER — Ambulatory Visit (INDEPENDENT_AMBULATORY_CARE_PROVIDER_SITE_OTHER): Payer: Medicare Other | Admitting: Family Medicine

## 2017-06-06 VITALS — BP 112/60 | Ht 70.0 in | Wt 176.2 lb

## 2017-06-06 DIAGNOSIS — Z0000 Encounter for general adult medical examination without abnormal findings: Secondary | ICD-10-CM | POA: Diagnosis not present

## 2017-06-06 DIAGNOSIS — N183 Chronic kidney disease, stage 3 unspecified: Secondary | ICD-10-CM

## 2017-06-06 DIAGNOSIS — E785 Hyperlipidemia, unspecified: Secondary | ICD-10-CM

## 2017-06-06 DIAGNOSIS — I10 Essential (primary) hypertension: Secondary | ICD-10-CM

## 2017-06-06 DIAGNOSIS — Z23 Encounter for immunization: Secondary | ICD-10-CM | POA: Diagnosis not present

## 2017-06-06 DIAGNOSIS — I5042 Chronic combined systolic (congestive) and diastolic (congestive) heart failure: Secondary | ICD-10-CM | POA: Insufficient documentation

## 2017-06-06 LAB — CBC
HCT: 35.7 % — ABNORMAL LOW (ref 39.0–52.0)
Hemoglobin: 11.1 g/dL — ABNORMAL LOW (ref 13.0–17.0)
MCH: 26.6 pg (ref 26.0–34.0)
MCHC: 31.1 g/dL (ref 30.0–36.0)
MCV: 85.6 fL (ref 78.0–100.0)
PLATELETS: 191 10*3/uL (ref 150–400)
RBC: 4.17 MIL/uL — AB (ref 4.22–5.81)
RDW: 16.9 % — ABNORMAL HIGH (ref 11.5–15.5)
WBC: 7.8 10*3/uL (ref 4.0–10.5)

## 2017-06-06 LAB — BASIC METABOLIC PANEL
Anion gap: 8 (ref 5–15)
BUN: 18 mg/dL (ref 6–20)
CO2: 28 mmol/L (ref 22–32)
Calcium: 8.8 mg/dL — ABNORMAL LOW (ref 8.9–10.3)
Chloride: 98 mmol/L — ABNORMAL LOW (ref 101–111)
Creatinine, Ser: 1.51 mg/dL — ABNORMAL HIGH (ref 0.61–1.24)
GFR calc Af Amer: 50 mL/min — ABNORMAL LOW (ref >60)
GFR, EST NON AFRICAN AMERICAN: 43 mL/min — AB (ref >60)
GLUCOSE: 111 mg/dL — AB (ref 65–99)
POTASSIUM: 4.1 mmol/L (ref 3.5–5.1)
Sodium: 134 mmol/L — ABNORMAL LOW (ref 135–145)

## 2017-06-06 NOTE — Progress Notes (Signed)
Subjective:    Patient ID: Paul Sandoval, male    DOB: June 25, 1941, 76 y.o.   MRN: [id number]  HPI AWV- Annual Wellness Visit  The patient was seen for their annual wellness visit. The patient's past medical history, surgical history, and family history were reviewed. Pertinent vaccines were reviewed ( tetanus, pneumonia, shingles, flu) The patient's medication list was reviewed and updated.  The height and weight were entered. The patient's current BMI is  Cognitive screening was completed. Outcome of Mini - Cog:   Falls within the past 6 months:None   Current tobacco usage: None  (All patients who use tobacco were given written and verbal information on quitting)  Recent listing of emergency department/hospitalizations over the past year were reviewed.  current specialist the patient sees on a regular basis: Patient states see Dr.Mohammed(Oncology), Dr.Nadel (pulmonologist), Dr.Mclean (Cardiologist)   Medicare annual wellness visit patient questionnaire was reviewed.  A written screening schedule for the patient for the next 5-10 years was given. Appropriate discussion of followup regarding next visit was discussed.  Coughing still going on, geerneally not productive, occas wheezing uses albuterol prn for this,  Not exercising as much . Walks once per two or three days   Patient states no concerns this visit.   Blood pressure medicine and blood pressure levels reviewed today with patient. Compliant with blood pressure medicine. States does not miss a dose. No obvious side effects. Blood pressure generally good when checked elsewhere. Watching salt intake.   Patient continues to take lipid medication regularly. No obvious side effects from it. Generally does not miss a dose. Prior blood work results are reviewed with patient. Patient continues to work on fat intake in diet    Review of Systems  Constitutional: Negative for activity change, appetite change and fever.    HENT: Negative for congestion and rhinorrhea.   Eyes: Negative for discharge.  Respiratory: Negative for cough and wheezing.   Cardiovascular: Negative for chest pain.  Gastrointestinal: Negative for abdominal pain, blood in stool and vomiting.  Genitourinary: Negative for difficulty urinating and frequency.  Musculoskeletal: Negative for neck pain.  Skin: Negative for rash.  Allergic/Immunologic: Negative for environmental allergies and food allergies.  Neurological: Negative for weakness and headaches.  Psychiatric/Behavioral: Negative for agitation.  All other systems reviewed and are negative.      Objective:   Physical Exam  Constitutional: He appears well-developed and well-nourished.  HENT:  Head: Normocephalic and atraumatic.  Right Ear: External ear normal.  Left Ear: External ear normal.  Nose: Nose normal.  Mouth/Throat: Oropharynx is clear and moist.  Eyes: Pupils are equal, round, and reactive to light. EOM are normal.  Neck: Normal range of motion. Neck supple. No thyromegaly present.  Cardiovascular: Normal rate, regular rhythm and normal heart sounds.   No murmur heard. Pulmonary/Chest: Effort normal and breath sounds normal. No respiratory distress. He has no wheezes.  Abdominal: Soft. Bowel sounds are normal. He exhibits no distension and no mass. There is no tenderness.  Genitourinary: Penis normal.  Genitourinary Comments: Prostate exam normal  Musculoskeletal: Normal range of motion. He exhibits no edema.  Lymphadenopathy:    He has no cervical adenopathy.  Neurological: He is alert. He exhibits normal muscle tone.  Skin: Skin is warm and dry. No erythema.  Psychiatric: He has a normal mood and affect. His behavior is normal. Judgment normal.          Assessment & Plan:  Impression 1 wellness exam. Colonoscopy due next year.  Diet exercise discussed within patient's current confines. Flu shot discussed and administered  #2 hypertension decent  control discussed maintain same meds  #3 hyperlipidemia good control discussed maintain same meds compliance discused  #4 lung cancer followed by specialist recent exacerbation of bronchitis has calm down. Warning signs discussed.

## 2017-06-17 ENCOUNTER — Other Ambulatory Visit (HOSPITAL_COMMUNITY): Payer: Medicare Other

## 2017-06-17 DIAGNOSIS — J449 Chronic obstructive pulmonary disease, unspecified: Secondary | ICD-10-CM | POA: Diagnosis not present

## 2017-06-24 ENCOUNTER — Ambulatory Visit (INDEPENDENT_AMBULATORY_CARE_PROVIDER_SITE_OTHER): Payer: Medicare Other | Admitting: Pulmonary Disease

## 2017-06-24 ENCOUNTER — Telehealth: Payer: Self-pay | Admitting: Pulmonary Disease

## 2017-06-24 VITALS — BP 120/72 | HR 87 | Temp 97.5°F | Ht 70.0 in | Wt 177.5 lb

## 2017-06-24 DIAGNOSIS — N183 Chronic kidney disease, stage 3 unspecified: Secondary | ICD-10-CM

## 2017-06-24 DIAGNOSIS — I5042 Chronic combined systolic (congestive) and diastolic (congestive) heart failure: Secondary | ICD-10-CM

## 2017-06-24 DIAGNOSIS — J90 Pleural effusion, not elsewhere classified: Secondary | ICD-10-CM

## 2017-06-24 DIAGNOSIS — I2510 Atherosclerotic heart disease of native coronary artery without angina pectoris: Secondary | ICD-10-CM

## 2017-06-24 DIAGNOSIS — I714 Abdominal aortic aneurysm, without rupture, unspecified: Secondary | ICD-10-CM

## 2017-06-24 DIAGNOSIS — J449 Chronic obstructive pulmonary disease, unspecified: Secondary | ICD-10-CM | POA: Diagnosis not present

## 2017-06-24 DIAGNOSIS — I10 Essential (primary) hypertension: Secondary | ICD-10-CM

## 2017-06-24 DIAGNOSIS — I255 Ischemic cardiomyopathy: Secondary | ICD-10-CM

## 2017-06-24 DIAGNOSIS — Z951 Presence of aortocoronary bypass graft: Secondary | ICD-10-CM

## 2017-06-24 DIAGNOSIS — C3411 Malignant neoplasm of upper lobe, right bronchus or lung: Secondary | ICD-10-CM

## 2017-06-24 DIAGNOSIS — I679 Cerebrovascular disease, unspecified: Secondary | ICD-10-CM

## 2017-06-24 DIAGNOSIS — Z95828 Presence of other vascular implants and grafts: Secondary | ICD-10-CM

## 2017-06-24 MED ORDER — ALBUTEROL SULFATE (2.5 MG/3ML) 0.083% IN NEBU: 2.5000 mg | INHALATION_SOLUTION | Freq: Four times a day (QID) | RESPIRATORY_TRACT | 11 refills | Status: DC

## 2017-06-24 MED ORDER — PREDNISONE 20 MG PO TABS: ORAL_TABLET | ORAL | 0 refills | Status: DC

## 2017-06-24 MED ORDER — SULFAMETHOXAZOLE-TRIMETHOPRIM 800-160 MG PO TABS: 1.0000 | ORAL_TABLET | Freq: Two times a day (BID) | ORAL | 0 refills | Status: DC

## 2017-06-24 NOTE — Patient Instructions (Signed)
Today we updated your med list in our EPIC system...    Continue your current medications the same...  For your bronchitic infection we have prescribed>>     BACTERIM-DS (generic) one tab twice daily til gone      PREDNISONE 20mg  tabs to take as follows>  One tab twice daily for 5 days...  Then decrease to one tab each AM for 5 days...  Then decrease to 1/2 tab each AM for 10 days...     Then decrease to 1/2 tab eaxch AM til gone...  We discussed continuing the GFN 400mg  tabs- 2 tabs three times daily w/ fluids...  We also increased your ALBUTEROL Neb med to 4 times daily   (#120/mo)  Call for any questions...  Let's plan a follow up visit in 6wks, sooner if needed for problems.Marland KitchenMarland Kitchen

## 2017-06-24 NOTE — Telephone Encounter (Signed)
Spoke with pt, who had further questions in regards to prednisone sig.  I have confirmed with SN, who verified below sig is correct.  Pt is aware and voiced his understanding. Nothing further needed.    PREDNISONE 20mg  tabs to take as follows>             One tab twice daily for 5 days...             Then decrease to one tab each AM for 5 days...             Then decrease to 1/2 tab each AM for 10 days...             Then decrease to 1/2 tab eaxch AM til gone.Marland KitchenMarland Kitchen

## 2017-06-25 ENCOUNTER — Encounter: Payer: Self-pay | Admitting: Pulmonary Disease

## 2017-06-25 NOTE — Progress Notes (Signed)
Subjective:     Patient ID: Paul Sandoval, male   DOB: 1941/01/04, 76 y.o.   MRN: [id number]  HPI  ~  February 29, 2016:  Paul Sandoval w/ SN>  Paul Sandoval is a 76 y/o WF w/ mult medical problems as noted below who has seen DrAlva & DrMannam previously;  I have reviewed his extensive epic records and recent [place] x2 in June2017 and formulated the following PROBLEM LIST>>  His PCP is DrLuking in [place]... He reminded me that I cared for his dad in 64...    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    ~  Paul Sandoval in 01/2016>  Fever, cough, increased RUL opac (PCT<0.10, he received 10d Levaquin) & right>left effusion w/ thoracentesis 02/22/16 removing 1200cc clear yellow fluid, prob transudate, & BNP was >900; meds adjusted & he was diuresed w/ improvement but 2DEcho revealed worsening CHF w/ EF ~30-35% + HK & AK => he has f/u pending w/ Cards...    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    ASPVD- s/p AAA stent graft 11/2012, mod bilat carotid occlusive dis (asymptomatic) w/ CDopplers showing ~50-60% bilat ICAstenoses>  [facility] plans yearly f/u CT Abd and CDopplers    MEDICAL issues>  HBP, HL, GERD/ Divertic/ Polyps, CKD-stage3, Anemia & thrombocytopenia>  On Simva80-1/2Qd, Prilosec20/d, Fe/ MVI/ etc... Since he was disch 02/25/16>  He reports feeling better, breathing better, still sl SOB w/ activ but not requiring O2, mild cough w/ beige sput, no hemoptysis, denies CP=> he is due for f/u CXR & blood work post hosp... EXAM shows Afeb, VSS, O2sat=95% on RA after walking back; Wt=180#;  HEENT- neg, mallampati2;  Chest- sl decr BS right base, few rhonchi, w/o w/r/consolidation;  Heart- RR Gr1/6SEM w/o r/g;  Abd- soft, neg;  Ext- neg w/o c/c/e...  PFT 02/01/16>  FVC=1.96 (45%), FEV1=1.25 (40%), %1sec=64, mid-flows were reduced at 28% predicted; post bronchodil there was a 31% improvement in FEV1 to 1.64;  TLC=5.96 (83%, RV=3.89 (  149%), RV/TLC=65%;  DLCO=58% pred.  This is c/w moderate to severe airflow obstruction, GOLD Stage 3 COPD w/ a signif asthmatic component, air trapping, and decr diffusion capacity...  2DEcho 02/22/16>  Mild conc LVH w/ decr LVF & EF=30-35% w/ diffuse HK & AK in several walls (see report), Gr2 DD, mild MR otherw norm valves, mild LAdil (10m), norm RV function & PA pressures  LABS 01/2016 Hosp>  Chems- ok x Cr=1.3-1.4, BS=120-140, Alb=2.8;  CBC- anemic w/ Hg=8-9 range, Plat~130K range;  TSH=0.92  CT Chest 02/21/16>  Norm heart size, extensive coronary calcif, s/p CABG, decr size of the pathological  right paratracheal LN, decr size of RUL mass (now ~2cm & prev ~4cm), new confluent airsp opac w/ air bronchograms in RUL w/ large right effusion; s/p GB, +HH, Abd Ao stent graft, DJD & DISH w/o metastatic lesions  CT Angio Chest 02/24/16>  NEG for PE, s/pCABG & stent in Lmain, norm heart size, no pericard fluid, small right pleural effusion, architectural distortion & interstitial opacities in RUL c/w XRT changes, RUL nodule measures ~2cm, no adenopathy reported...   CXR 02/29/16>  Stable heart size, tortuous Ao, s/p CABG, sl improvement in interstitial opac in RUL area & posteromedial RUL nodule  LABS 02/29/16>  Chems- ok x HCO3=36, BS=120, BUN/Cr=30/1.40;  CBC- Hg=10.9, Plat=249K, WBC=18.2 (on Pred);  BNP=1053;  Sed=53 IMP/PLAN>>  Problem list as above w/ signif cardiac, pulmonary, & post chemoradiation changes;  From the pulm standpoint- he feels the BREO is helping & hasn't needed the rescue inhaler very often, continue same;  He is on a long PRED taper per DrManning for poss radiation fibrosis RUL, Sed rate is 53, continue this taper & careful w/ sweets/ sugar etc;  From the cardiac standpoint- he has signif underlying dis w/ Lmain stent, s/p CABG 20 yrs ago, prob ischemic cardiomyopathy w/ worsening 2DEcho recently w/ 30-35% EF + HK&AK seen, BNP=1053, Cards plans ischemic work up when able, in the meanwhile he has improved w/ diuresis but Chems w/ mild RI & HCO3=36... REC- add DIAMOX-ER 500 one tab daily at 4pm, continue his Lasix40 Qam, increase water intake, watch weights... We plan ROV [id number]...  ~  April 04, 2016:  30m72moV w/ SN>> Paul Sandoval the Oncology symptom management clinic 8/4 w/ intermittent cough, wheezing, fever; they did a f/u CXR interpreted as showing a right base opac suspicious for acute pneumonia but this is a soft finding on my review of the XRay, and the prev RUL reticulonod opac is improved (radiation fibrosis improved w/ Pred taper); placed on Levaquin Rx;  He denies sput  production, hemoptysis, CP, or SOB but he is fairly sedentary.    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    ~  [facility]  Fever, cough, increased RUL opac (PCT<0.10, he received 10d Levaquin) & right>left effusion w/ thoracentesis 02/22/16 removing 1200cc clear yellow fluid, prob transudate, & BNP was >900; meds adjusted & he was diuresed w/ improvement but 2DEcho revealed worsening CHF w/ EF ~30-35% + HK & AK => he has f/u pending w/ Cards...    ~  [facility]  He had right pleural effusion tapped 02/22/16=> Prob TRANSUDATE w/ TProt<3.0, LDH=95, Cytology=NEG (reactive mesothelial cells only);  Cults=> no growth    ~  03/2016 developed cough/ wheezing/ low grade fever=> CXR w/ improved RUL, ?incr markings R base, he was off the Pred, given more Levaquin...     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    ~  He saw CARDS PA 03/01/16> no change in meds, Lexiscan Myoview 03/06/16 showed hi risk study w/ EF=30%, no ST segm changes, c/w antero-apical MI & no evid reversible ischemia...    ASPVD- s/p AAA stent graft 11/2012, mod bilat carotid occlusive dis (asymptomatic) w/ CDopplers showing ~50-60% bilat ICAstenoses>  [facility] plans yearly f/u CT Abd and CDopplers    MEDICAL issues>  HBP, HL, GERD/ Divertic/ Polyps, CKD-stage3, Anemia & thrombocytopenia>  On Simva80-1/2Qd, Prilosec20/d, Fe/ MVI/ etc... EXAM shows Afeb, VSS, O2sat=99% on RA; Wt=178#;  HEENT- neg, mallampati2;  Chest- sl decr BS right base, few rhonchi, w/o w/r/consolidation;  Heart- RR Gr1/6SEM w/o r/g;  Abd- soft, neg;  Ext- neg w/o c/c/e...  Lexiscan Myoview 03/06/16 showed hi risk study w/ EF=30%, no ST segm changes, c/w antero-apical MI & no evid reversible ischemia...  CXR 03/30/16>  incr patchy opac at the right base, no effusion, RUL reticulonodular opac in RUL has regressed; norm heart size s/p CABG, abd Ao endograft part vis  LABS 03/28/16>  Chems- ok w/ K=4.0, HCO3=23, Cr=1.6, BS=145;  CBC- ok w/ Hg=11.4, wbc=6.1, plat=85K;   IMP/PLAN>>  Discussed w/ Paul Sandoval-- continue the Levaquin til gone, use Tylenol for fever or pain; continue the Breo daily & Ventolin-HFA as needed; wean off the Diamox at this time, continue Lasix40, no salt, etc; he needs to gradually increase his exercise/ mobility; we will plan ROV w/ labs in 230mo.  ~  June 06, 2016:  53m153moV w/ SN>  BilDaaielports that he is feeling better, gaining strength, walking 53mi49m mowing yard, and resting well at night;  He notes min hacking cough, no sput- no discoloration or blood, SOB is diminished & he denies CP, no f/c/s... He saw DrMohamed 05/16/16- pt remains on observation (s/p chemoradiation, 5 cycles w/ partial  response); repeat CT Chest 05/10/16 showed more confluent airsp dis in RUL w/ vol loss, abn soft tissue attenuation in right hilum has progressed (?felt to be treatment related), stable subcarinal LN ~11mm19mderate right effusion... DrMohamed felt this scan showed no concerning features of dis progression & they opted for continued observ  w/ f/u 50mo.. 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    ~  [facility]>  Fever, cough, increased RUL opac (PCT<0.10, he received 10d Levaquin) & right>left effusion w/ thoracentesis 02/22/16 removing 1200cc clear yellow fluid, prob transudate, & BNP was >900; meds adjusted & he was diuresed w/ improvement but 2DEcho revealed worsening CHF w/ EF ~30-35% + HK & AK => he has f/u pending w/ Cards...    ~  [facility]  He had right pleural effusion tapped 02/22/16=> Prob TRANSUDATE w/ TProt<3.0, LDH=95, Cytology=NEG (reactive mesothelial cells only);  Cults=> no growth    ~  03/2016 developed cough/ wheezing/ low grade fever=> CXR w/ improved RUL, ?incr markings R base,  he was off the Pred, given more Levaquin...     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    ~  He saw CARDS PA 03/01/16> no change in meds, Lexiscan Myoview 03/06/16 showed hi risk study w/ EF=30%, no ST segm changes, c/w antero-apical MI & no evid reversible ischemia...    ASPVD- s/p AAA stent graft 11/2012, mod bilat carotid occlusive dis (asymptomatic) w/ CDopplers showing ~50-60% bilat ICAstenoses>  [facility]- plans yearly f/u CT Abd and CDopplers    MEDICAL issues>  HBP, HL, GERD/ Divertic/ Polyps, CKD-stage3, Anemia & thrombocytopenia>  On Simva80-1/2Qd, Prilosec20/d, Fe/ MVI etc... EXAM shows Afeb, VSS, O2sat=95% on RA; Wt=183# (up 5#);  HEENT- neg, mallampati2;  Chest- sl decr BS right base, few rhonchi, w/o w/r/consolidation;  Heart- RR Gr1/6SEM w/o r/g;  Abd- soft, neg;  Ext- neg w/o c/c/e...  CT Chest 05/10/16 showed more confluent airsp dis in RUL w/ vol loss, abn soft tissue attenuation in right hilum has progressed (?felt to be treatment related), stable subcarinal LN ~1161mmoderate right effusion.  CXR 06/06/16> progressive incr density in RUL & vol loss, soft tissue fullness in right hilum, s/p CABG and calcif in wall of Ao arch  PET scan 06/15/16> persistent but diminished hypermetabolism in the RUL area of interstitial & airsp opac-  likely related to XRT, no hypermetabolism in the prev R paratrachial LN, similar sized subcarinal LN w/ low level hypermetabolism noted; new area of LUL anterior  interstitial & airsp dis w/ some bronchiectasis shows low level hypermetabolism (?radiation change?); moderate right effusion...  IMP/PLAN>>  He is feeling better, performance status improving, CXR shows progressive changes in RUL s/p chemoradiation; CT=>PET scans w/ vol loss & persistent hypermetabolism/ evolving changes in RUL & right hilum plus he has a mod large right effusion; prev thoracentesis 01/2016 in hosp was transudative- I believe he would benefit from further eval/ repeat thoracentesis to recheck this fluid; we will set this up via IR & ask them to drain the fluid as much as poss, send it for Cytology, cell ct & diff, TProt/ LDH/ Gluc...  ~  August 06, 2016:  56moROV w/ SN>  BKaelreports that his SOB & energy have improved, appetite is better; he is more active walking 252m5d/wk + yard work/ mowing/ leaves/ etc; he notes min cough, small amt beige sput, stable DOE, no CP or edema... He tells me that DrMohamed has CT Chest & LABS ordered for next week so we will wait for these tests & review when avail...     COPD (Stage3 w/ signif revers component), former smoker (quit 1996 w/ ~40+ pack-yr hx)>  On Symbicort160-2spBid regularly ([facility] changed Rx), VentolinHFA rescue inhaler as needed...    He saw DrCherly Hensenor CARDS 07/04/16>  HBP, CAD- s/p CABG 1996 & subseq PCI to graft vessels, chr combined sys&diast CHF w/ cardiomyopathy, PVD- w/ carotid dis & Ao stent graft per drLawson, HL, CKD;  No changes made to his ASA/ Plavix, Metoprolol50-1.5Bid, Lasix40, Simva40...     EXAM shows Afeb, VSS, O2sat=98% on RA; Wt=187# (up 4#);  HEENT- neg, mallampati2;  Chest- sl decr BS right base, few rhonchi, w/o w/r/consolidation;  Heart- RR Gr1/6SEM w/o r/g;  Abd- soft, neg;  Ext- neg w/o c/c/e...  Throacentesis 06/22/17 by IR>  1.2L removed (hazy yellow fluid)=> Cell ct=923 cells w/ 2 Neutro/ 58 lymph/ 40 mono-macrophages;  Chems- LDH=119, TPro=4.1, Gluc=101;  Cyto= Atypic cells c/w reactive mesothel cells.  Post  thoracentesis CXR 06/22/17>  decr right effusion, no pneumoth, dense consolid RUL unchanged, post-op BABG...  CT Chest 08/14/16>  Norm heart size- s/p CABG & Ao atherosclerotic changes; stable upper lim adenopathy; decr right effusion- now small in size; similar interstitial & airsp dis in right perihilar & medial LUL is c/w radiation fibrosis; no signif pulm nodules or masses- no new or progressive dis...   LABS 08/14/17>  Chems- ok x Cr=1.6;  CBC- ok w/ Hg=12.7...Marland KitchenMarland KitchenMP/PLAN>>  BiKeis clinically stable, no clear evid of recurrent dis & followed very closely by DrMohamed; Pulm stable on Symbicort Bid, Albut rescue prn, and his exercise- continue same & we plan rov recheck in 60m56mo  ~  November 05, 2016:  60mo97mo & Paul Sandoval indicates "stronger, better" on diet & exercise program w/ good energy (eg- climbed steps at a ball game);  He denies much cough (clear throat), small amt clear sput, no blood, SOB w/ exertion only- ADLs ok & DOE stable, no CP & he describes 2mi 22mk 5d/wk & he stays busy...  [facility] (sl incr subcarinal LN, scarring & radiation fibrosis, ?sl incr right effusion)... 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    ~  [place]  Fever, cough, increased RUL opac (PCT<0.10, he received 10d Levaquin) & right>left effusion w/ thoracentesis 02/22/16 removing 1200cc clear yellow fluid, prob transudate, & BNP was >900; meds adjusted & he was diuresed w/ improvement but 2DEcho revealed worsening CHF w/ EF ~30-35% + HK & AK => he has f/u pending w/ Cards...    ~  [facility]  He had right pleural effusion tapped 02/22/16=> Prob TRANSUDATE w/ TProt<3.0, LDH=95, Cytology=NEG (reactive mesothelial cells only);  Cults=> no growth    ~  03/2016 developed cough/ wheezing/ low grade fever=> CXR w/ improved RUL, ?incr markings R base, he was off the Pred, given more Levaquin...     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    ~  He saw CARDS PA 03/01/16> no change in meds, Lexiscan Myoview 03/06/16 showed hi risk study w/ EF=30%, no ST segm changes, c/w antero-apical MI & no evid reversible ischemia...    ASPVD- s/p AAA stent graft 11/2012, mod bilat carotid occlusive dis (asymptomatic) w/ CDopplers showing ~50-60% bilat ICAstenoses>  [facility]- plans yearly f/u CT Abd and CDopplers    MEDICAL issues>  HBP, HL, GERD/ Divertic/ Polyps, CKD-stage3 (cr=1.6), Anemia (Hg=11.9) & thrombocytopenia (plat=171K)>  On Simva80-1/2Qd, Prilosec20/d, Fe/ MVI etc... EXAM shows Afeb, VSS, O2sat=98% on RA; Wt=189# (up 2#);  HEENT- neg,  mallampati2;  Chest- sl decr BS right base, few rhonchi, w/o w/r/consolidation;  Heart- RR Gr1/6SEM w/o r/g;  Abd- soft, neg;  Ext- neg w/o c/c/e...  CT Chest 10/30/16>  Norm heart size, s/p CABG, atherosclerosis in Ao; prev 154msubcarinal LN now measures 1376msl incr in right pleural effusion, radiation fibrosis & changes in medial right lung and ant LULare stable/ unchanged;   LABS 10/29/16>  Chems- wnl w/ Cr=1.6, LFTs= wnl;  CBC- ok w/ Hg=11.9, WBC=10.1  Ambulatory Oximetry 11/05/16>  O2sat=96% on RA at rest w/ pulse=81/min;  He ambulated 3 laps in office (185'each) w/ lowest O2sat=90% w/ pulse 99/min... IMP/PLAN>>  BilLarance doing satis on Symbicort160-2spBid & Albut rescue inhaler prn; he states that he is active, energy is good, & that he's getting stronger &  better (good performance status); I suspect that he has residual cancer in the right chest but it appears slowly progressive (w/ sl incr right effusion & subcarinal LN) and DrMohamed to review status & decide regarding additional therapy at this point vs continued observation... we plan rov recheck in 3-79mo  NOTE:  >50% of this 25 min rov was spent in counseling & coordination of care...  ~  Jan 02, 2017:  2557moOV & Paul Sandoval called 4/24 c/o cough, yellow sput, low grade fever after having been Rx x2 by PCP w/ Doxy (note- Flu-B was pos in resp viral panel); we placed him on Levaquin & Medrol dosepak & he is some better- still congested and phlegm is beige but feeling better overall; we discussed addition of Mucinex 1200bid + fluids vs Guaifenesin 400-2Tid to save $$...     COPD> on Symbicort160-2spBid and Ventolin-HFA prn    Lung cancer- followed by DrMohamed & DrManning> RUL non-small cell ca IIIA, dx 3/17 & rx w/ Chemoradiation; subseq RUL opac (infection vs XRT-fibrosis & right effusion;  He saw DrMohamed 11/15/16- note reviewed & he was doing well at that time, playing golf & feeling good; CT Chest 10/30/16 w/ sl incr right effusion, sl incr  subcarinal LN from 10=>1336mradiation fibrosis areas stable; DrMohamed felt there was no clear sign of dis progression & rec f/u scans in 757mo746moe 01/2017)...    Cardiac- CAD, s/p CABG, Cardiomyopathy, CHF> on ASA/ Plavix, Metop25Bid, Lasix40; he saw DrNiCherly Hensen7/18- note reviewed; EF=30%, he mentioned more aggressive cardiac w/u & rx if cancer progrosis favorable (no changes made, f/u 46mo 68moDEcho)...    ASPVD- s/p AAA stent graft, bilat carotid dis> on ASA/ Plavix    Medical issues>  HBP, HL, GERD/ Divertic/ Polyps, CKD-stage3 (cr=1.6), Anemia (Hg=11.9) & thrombocytopenia (plat=171K)> EXAM shows Afeb, VSS, O2sat=98% on RA; Wt=183# (down 6#);  HEENT- neg, mallampati2;  Chest- sl decr BS right base, few rhonchi, w/o w/r/consolidation;  Heart- RR Gr1/6SEM w/o r/g;  Abd- soft, neg;  Ext- neg w/o c/c/e... IMP/PLAN>>  BillyStylesmproved but it's been a slow process; he is at high risk for cancer progression & followed closely by DrMohamed who plans f/u CT & recheck in June;  Continue current meds, add Guaifenesin 2400mg/15mwater to aid in mucus production... We will recheck in 57mo & 837moew his f/u scans planned by Oncology.  ~  February 04, 2017:  37mo ROV257modd-on appt requested for cough, thick yellow sput, chest congestion w/ wheezing>  Notes low grade temp in the afternoon w/o c/s;  Denies CP/ palpit/ hemoptysis/ change in SOB/DOE; He is taking SYMBICORT160-2spBid, GFN 400-2Tid + fluids, plus rescue inhaler => we gave him a NEB Rx in office (min relief), sent sput for C&S (grew abundant NTF only); we discussed need for f/u CXR (CT due soon from Mohamed)Winiganx w/ Levaquin/ Pred taper...    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    ~  [facility] in 01/2016>  Fever, cough, increased RUL opac (PCT<0.10, he received 10d Levaquin) & right>left effusion w/ thoracentesis 02/22/16 removing 1200cc clear yellow fluid, prob transudate, & BNP was >900; meds adjusted & he was diuresed w/ improvement but 2DEcho revealed worsening CHF w/ EF ~30-35% + HK & AK => he has f/u pending w/ Cards...    ~  [facility]  He had right pleural effusion tapped 02/22/16=> Prob TRANSUDATE w/ TProt<3.0, LDH=95, Cytology=NEG (reactive mesothelial cells only);  Cults=> no growth    ~  03/2016 developed cough/ wheezing/ low grade fever=> CXR w/ improved RUL, ?incr markings R base, he was off the Pred, given more Levaquin...     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    ~  He saw CARDS PA 03/01/16> no change in meds, Lexiscan Myoview 03/06/16 showed hi risk study w/ EF=30%, no ST segm changes, c/w antero-apical MI & no evid reversible ischemia...    ASPVD- s/p AAA stent  graft 11/2012, mod bilat carotid occlusive dis (asymptomatic) w/ CDopplers showing ~50-60% bilat ICAstenoses>  [facility]- plans yearly f/u CT Abd and CDopplers    MEDICAL issues>  HBP, HL, GERD/ Divertic/ Polyps, CKD-stage3 (cr=1.6), Anemia (Hg=11.9) & thrombocytopenia (plat=171K)>  On Simva80-1/2Qd, Prilosec20/d, Fe/ MVI etc... EXAM shows Afeb, VSS, O2sat=97% on RA; Wt=179# (down 4#);  HEENT- neg, mallampati2;  Chest- sl decr BS right base, few rhonchi, w/o w/r/consolidation;  Heart- RR Gr1/6SEM w/o r/g;  Abd- soft, neg;  Ext- neg w/o c/c/e...  CXR 02/04/17 (independently reviewed by me in the PACS system)> ?loculated mod right pleural effusion, RUL airsp dis is unchanged & c/w post XRT changes; left lung is clear, stable cardiac silhouette & prior CABG  Sputum C&S> abundant NTF, no pathogen identified... IMP/PLAN>>  We discussed the need for another thoracentesis=> check cytology;  We will Rx w/ empiric LEVAQUIN & PREDNISONE taper, plus his GFN at max doses w/ fluids;  Also awaiting f/u CT Chest due soon...  ADDENDUM>> Thoracentesis by IR 02/11/17>  280cc of dark bloody fluid removed, due to loculation no additional fluid was obtainable;  Fluid= exudative. & Cytology= NEGATIVE... ADDENDUM>> f/u CT Chest done 02/13/17>  Norm heart size, aortic atherosclerosis, prev CABG; mod loculated right pleural effusion; mass-like architectural distortion in RUL c/w XRT, new area of airsp consolidation in RML~3cm, mult faint nodular densities scattered on the right lung, borderline subcarinal node; Abd & musculoskeletal are ok... ADDENDUM>>  02/15/17>  Pt indicates that he is ?no better, c/o persistent cough, congested, thick yellow sput, no hemoptysis, low grade temps 99-100 range, denies much CP (just sore from coughing) and SOB/DOE about the same;  We discussed his limited options here & have suggested the following--    1) keep PRED 84m/d for now;  Add empiric AUGMENTIN 879mone tab bid x10d  (plus Align/Activia);  Add NEBULIZER w/ ALBUTEROL TID regularly and space out the Symbicort Rx still at 2sp Bid...Marland KitchenMarland Kitchen  2) check 2DEcho to compare EF to 7/17 (30-35%)    3) refer to TCTS for the chr right pleural effusion to consider poss chest tube vs VATS vs decortication surg (DrBartle did his CABG yrs ago)  ~  July 18.2018:  44m66moV & post hosp visit>  BilChristians undergone an extensive work up over the last month or so w/ thoracentesis, CT Chest, TSurg consultation followed by VATS drainage & pleurosesis, and f/u 2DEcho showing worsening LVF=> referred to CHF clinic & there recommendations are pending...    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) presenting w/ RUL mass w/ mediastinal adenopathy 3/17;  He received chemoradiation w/ wkly carboplatin & paclitaxel (5 cycles w/ part response) + XRT from DrManning finished 01/26/16;  Follow up CT Chest 02/13/17 reviewed (see above), & DrMohamed felt there was no recurrence or dis progression but +for inflamm changes in RLL & loc pleural effusion; rec to re-scan in 77mo25mo We set him up to see DrBartle 02/25/17> he reiewed prev eval, scan, thoracenteses, etc; he agreed w/ performing a right VATS to drain the resid effusion 7 examine the pleural space for tumor/ infection etc followed by pleurodesis...    He was ADM 7/6 - 03/08/17 by DrBartle w/ right VATS w/ drainage of pleural effusion & Bx of pleural surface + talc pleurodesis=> all pathology was benign (fibrous tissue & chr inflamm; Disch on similar med regimen + [phone] stable since surg- +SOB/DOE w/ stairs etc, reports eating well, no CP & hasn't required pain meds; denies f/c/s, notes min cough & sm amt whitish sput- no color, no blood... Finishing Levaquin, off Pred, on NEB w/ Albut Tid, Symbicort160-2spBid, GFN400-2Tid + fluids...    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 EXAM shows Afeb, VSS, O2sat=99% on RA; Wt=174# (down 5#);  HEENT- neg, mallampati2;  Chest- sl decr BS right base, few rhonchi, w/o w/r/consolidation;  Heart- RR Gr1/6SEM w/o r/g;  Abd- soft, neg;  Ext- neg w/o c/c/e...  2DEcho done 02/26/17 showed mod dil LV & severely reduced LVF w/ EF down to 20-25%, Gr1DD, mod calcif AoV leaflets, MV was OK, RV mod dilated & sys function mildly reduced, no PAsys reported...Marland KitchenMarland Kitchen  IMP/PLAN>>  The worsening LVF w/ EF 15-17% is certainly playing a roll in his unrespolved dyspnea=> refer to CHF clinic for active management; in the interim continue NEBs, Symbicort, Mucinex, etc...   ~  April 24, 2017:  6wk [place] had CHF clinic eval by [facility] on 04/22/17-- note reviewed, Hx CAD w/ CABG in 1996 & PCI in 2015, ischemic cardiomyopathy, and c/o incr SOB & recent 2DEcho (02/2017) showed EF=20-25%;  DrMcLean adjusted his meds by changing Metoprolol to Coreg & adding Entresto, ultimately he would like to add Spironolactone; they discussed R+L heart cath soon (sched 9/14)...      Paul Sandoval has had f/u appts w/ PCP-DrLuking on 03/14/17 (transitional care follow up) and TCTS-DrBartle on 03/20/17>  Notes reviewed, his breathing is somewhat improved, on Levaquin for Serratia, DrBartle noted>>  Most of the right lung is firmly adherent to the chest wall from his prior XRT. There is a loculated fluid collection at the apex of the chest that is sealed in by the lung and could not be drained. There was a large collection in the  lower pleural space that was drained and talc pleurodesis performed. Hopefully this space will resolve over time with the talc and not refill with fluid. There is no evidence of malignancy so it is possible that this is related to heart  failure given his EF of 20-25%. He reports that he has started back to walking & slowly building up, even played 2 nine-hole rounds of golf when it wasn't too humid; he has a resid cough but decr sput & easier to produce, he requests a handicap placard... See prob list above... EXAM shows Afeb, VSS, O2sat=96% on RA; Wt=178# (up 4#);  HEENT- neg, mallampati2;  Chest- sl decr BS right base, few rhonchi, w/o w/r/consolidation;  Heart- RR Gr1/6SEM w/o r/g;  Abd- soft, neg;  Ext- neg w/o c/c/e...  CXR 04/24/18 (independently reviewed by me in the PACS system) shows left lung clear, right sided vol loss, small A/F levels at the base, pleural fluid vs thickening at right base, stable soft tissue fullness at right apex, s/p CABG etc. IMP/PLAN>>  Paul Sandoval appears stable post T-surg & improved w/ CHF-clinic Rx per DrMcLean;  OK 2018 FLU shot today, and continue pulm rx w/ Albut NEBS Tid, Symbicort160-2spBid, GFN400-2Bid;  Call for any questions and we plan f/u visit in 1mo..   ~  June 24, 2017:  212moOV & Paul Sandoval presents w/ a 2d hx incr cough, yellow sput, & chest congestion- denies f/c/s;  He's been using NEBs Qid (incr per DrLuking), plus his Symbicort160-2spBid & GFN400-2Tid;  He had a ZPak called in recently;  We discussed BacterimDS Bid x10d  for this COPD exac + Pred20- 5d slow tapering protocol (see AVS) w/ rov recheck 6wks...     He saw TCTS-DrBartle 05/22/17>  S/p bronch & right VATS to drain the loculated right effusion & talc pleurodesis 03/01/17;  At surg he noted most of the right lung was firmly adherent to the chest wall from his prior XRT. There was a loculated fluid collection at the apex of the chest that was sealed in by the lung and could not be drained. There was a large collection in the lower pleural space that was drained and talc pleurodesis performed. Unfortunately the right lower lobe was partially collapsed and chronically scarred and would not expand to fill in the space formed after draining the effusion => they signed off...    He saw VVS-DrCain 05/24/17>  Hx AAA- s/p endovasc stent graft repair, & hx Carotid art dis being followed;  Abd Ao Duplex was stable w/ 3.3 x 3.4cm aneurysm w/o elev velocities;  CDuplex w/ 40-59% RICA stenosis & 3-26% LICA and they plan recheck 60yr..    He saw CARDS-DrMcLean 05/27/17>  CAD, s/p CABG 1996, subseq PCIs, RBBB, ischemic cardiomyopathy/ CHF w/ 2DEcho showing EF=20-25% in 7/18;  Also has Hx AAA w/ prec stent graft repair;  He had CATH 04/2017 showing occluded SVG-small D, patent LIMA-LAD and SVG-PDA, 50% shelf-like stenosis distal left main (no intervention), filling pressure not elevated and cardiac output preserved.  On Coreg & Entresto, they added spironolactone & f/u 255mo   He saw ONC-DrMohamed 05/28/17>  DIAGNOSIS: Stage IIIA (T2b, N2, M0) non-small cell lung cancer, favoring squamous cell carcinoma presented with right upper lobe lung mass in addition to mediastinal lymphadenopathy diagnosed in March 2017.  PRIOR THERAPY:  A course of concurrent chemoradiation with weekly carboplatin for AUC of 2 and paclitaxel 45 MG/M2. Status post 5 cycle with partial response.  CURRENT THERAPY: Observation.  He had f/u CXR & CT Chest (see below)- no evid of dis progression/ recurrence & f/u planned  57m45mo    He saw PCP-DrLuking 06/06/17>  Annual wellness visit, note reviewed, same meds & they discussed compliance issues... EXAM shows Afeb, VSS, O2sat=98% on RA; Wt=178# (stable);  HEENT- neg, mallampati2;  Chest- sl decr BS right base, few rhonchi, w/o w/r/consolidation;  Heart- RR Gr1/6SEM w/o r/g;  Abd- soft, neg;  Ext- neg w/o c/c/e...  CT Chest 05/21/17>  See report -- IMPRESSION:  1) Similar radiation fibrosis within the paramediastinal right lung.  2) Increase in ill-defined micro nodularity bilaterally. Favor infection, including atypical etiologies.  3) Interval T4 compression deformity, favored to be posttraumatic. No metastasis identified in this region on the prior CT.  4) Aortic Atherosclerosis.  5) Loculated right-sided pleural fluid and inferior right hydropneumothorax, similar.  6) Tiny hiatal hernia. Gastric underdistention with possible gastric wall thickening suggestive of gastritis.  7) Similar borderline subcarinal adenopathy.  8)  Emphysema.  CXR 05/22/17>  Norm heart size w/ prev CABG & ao calcif, stable vol loss on the right & soft tissue fullness in right hilum, w/ consolid right apex, mod right effusion w/ loculated air/fluid IMP/PLAN>>  As above- we discussed treating THIs COPD exac w/ BacterimDS & PRED taper; he is to continue w/ his NEBS, Symbicort, GFN and we plan recheck in 6wks time...     Past Medical History:  Diagnosis Date  . AAA (abdominal aortic aneurysm) ([place]   4.4 cm 08/2008;4.44 in 7/10 and 4.65 in 08/2009; 4.8 by CT in 11/2009; 4.3 by ultrasound in 08/2010  . Anemia   . Arteriosclerotic cardiovascular disease (ASCVD) 1996   CABG-1996  . Arthritis    "fingers" (03/18/2014)  . CAD (coronary artery disease)    03/18/14:  PCI with DES to distal left main. 7/29: DES to the SVG to Diag  . Cancer ([place]  Upper right lobe lung cancer  . Cardiomyopathy, ischemic    Echo 03/17/14: EF 45-50%  . Chronic bronchitis ([place] . Chronic kidney disease    CRF  .  Chronic rhinitis   . Colonic polyp 2002   polypectomy in 2002  . COPD (chronic obstructive pulmonary disease) ([place] . Diverticulosis   . Dyspnea    with exertion  . ED (erectile dysfunction)   . Encounter for antineoplastic chemotherapy 12/19/2015  .  GERD (gastroesophageal reflux disease)   . History of blood transfusion   . Hyperlipidemia   . Hypertension   . IFG (impaired fasting glucose)   . Myocardial infarction [facility])    "told h/o silent MI sometime before 1996"  . Pneumonia ~ 2001; ~ 2005    has had more than twice  . Right bundle branch block   . Tobacco abuse, in remission    40 pack year total consumption; discontinued in 1996    Past Surgical History:  Procedure Laterality Date  . ABDOMINAL AORTIC ANEURYSM REPAIR  11/2012  . ABDOMINAL AORTIC ENDOVASCULAR STENT GRAFT N/A 12/11/2012   Procedure: ABDOMINAL AORTIC ENDOVASCULAR STENT GRAFT;  Surgeon: Mal Misty, MD;  Location: [place];  Service: Vascular;  Laterality: N/A;  Ultrasound guided; [place]  . CARDIAC CATHETERIZATION  01/08/1995  . COLONOSCOPY  2002   polypectomy-patient denies  . CORONARY ANGIOPLASTY WITH STENT PLACEMENT  03/18/2014   "1"  . CORONARY ANGIOPLASTY WITH STENT PLACEMENT  03/24/2014   "1"  . CORONARY ARTERY BYPASS GRAFT  01/09/1995   "CABG X3"  . FLEXIBLE BRONCHOSCOPY N/A 03/01/2017   Procedure: FLEXIBLE BRONCHOSCOPY WITH BIOPSIES;  Surgeon: Gaye Pollack, MD;  Location: [facility];  Service: Thoracic;  Laterality: N/A;  . JOINT REPLACEMENT    . LAPAROSCOPIC CHOLECYSTECTOMY  12/2009  . LEFT AND RIGHT HEART CATHETERIZATION WITH CORONARY/GRAFT ANGIOGRAM N/A 03/18/2014   Procedure: LEFT AND RIGHT HEART CATHETERIZATION WITH Beatrix Fetters;  Surgeon: Blane Ohara, MD;  Location: [facility] CATH LAB;  Service: Cardiovascular;  Laterality: N/A;  . PERCUTANEOUS CORONARY STENT INTERVENTION (PCI-S)  03/18/2014   Procedure: PERCUTANEOUS CORONARY STENT INTERVENTION (PCI-S);  Surgeon: Blane Ohara, MD;  Location:  [facility] CATH LAB;  Service: Cardiovascular;;  . PERCUTANEOUS CORONARY STENT INTERVENTION (PCI-S) N/A 03/24/2014   Procedure: PERCUTANEOUS CORONARY STENT INTERVENTION (PCI-S);  Surgeon: Blane Ohara, MD;  Location: [facility] CATH LAB;  Service: Cardiovascular;  Laterality: N/A;  . PLEURAL EFFUSION DRAINAGE Right 03/01/2017   Procedure: DRAINAGE OF PLEURAL EFFUSION;  Surgeon: Gaye Pollack, MD;  Location: [place];  Service: Thoracic;  Laterality: Right;  . RIGHT/LEFT HEART CATH AND CORONARY/GRAFT ANGIOGRAPHY N/A 05/15/2017   Procedure: RIGHT/LEFT HEART CATH AND CORONARY/GRAFT ANGIOGRAPHY;  Surgeon: Larey Dresser, MD;  Location: [place] CV LAB;  Service: Cardiovascular;  Laterality: N/A;  . TALC PLEURODESIS Right 03/01/2017   Procedure: Pietro Cassis;  Surgeon: Gaye Pollack, MD;  Location: [place];  Service: Thoracic;  Laterality: Right;  . TOTAL HIP ARTHROPLASTY Left 01/21/2013   Procedure: TOTAL HIP ARTHROPLASTY ANTERIOR APPROACH;  Surgeon: Mauri Pole, MD;  Location: [place];  Service: Orthopedics;  Laterality: Left;  Marland Kitchen VIDEO ASSISTED THORACOSCOPY Right 03/01/2017   Procedure: VIDEO ASSISTED THORACOSCOPY WITH BIOPSIES;  Surgeon: Gaye Pollack, MD;  Location: [place];  Service: Thoracic;  Laterality: Right;  Marland Kitchen VIDEO BRONCHOSCOPY N/A 11/17/2015   Procedure: VIDEO BRONCHOSCOPY WITH FLUORO;  Surgeon: Rigoberto Noel, MD;  Location: [place];  Service: Cardiopulmonary;  Laterality: N/A;    Outpatient Encounter Prescriptions as of 06/24/2017  Medication Sig  . acetaminophen (TYLENOL) 500 MG tablet Take 500 mg by mouth every 6 (six) hours as needed for mild pain.  Marland Kitchen albuterol (PROVENTIL) (2.5 MG/3ML) 0.083% nebulizer solution Take 3 mLs (2.5 mg total) by nebulization 4 (four) times daily.  Marland Kitchen aspirin EC 81 MG tablet Take 81 mg by mouth daily before breakfast.   . budesonide-formoterol (SYMBICORT) 160-4.5 MCG/ACT inhaler Inhale 2 puffs into  the lungs 2 (two) times daily.  . carvedilol (COREG) 6.25 MG  tablet Take 1 tablet (6.25 mg total) by mouth 2 (two) times daily.  . clopidogrel (PLAVIX) 75 MG tablet TAKE ONE TABLET BY MOUTH DAILY.  Marland Kitchen Ferrous Sulfate (IRON) 28 MG TABS Take 28 mg by mouth daily.  . furosemide (LASIX) 40 MG tablet Take 40 mg by mouth daily before breakfast.  . GuaiFENesin (MUCUS RELIEF ADULT PO) Take 2 capsules by mouth 3 (three) times daily.  . Multiple Vitamins-Minerals (CENTRUM SILVER ADULT 50+) TABS Take 1 tablet by mouth daily with lunch.   Marland Kitchen NITROSTAT 0.4 MG SL tablet PLACE 1 TAB UNDER TONGUE EVERY 5 MIN IF NEEDED FOR CHEST PAIN. MAY USE 3 TIMES.NO RELIEF CALL 911.  . omeprazole (PRILOSEC) 20 MG capsule TAKE ONE CAPSULE BY MOUTH DAILY.  . Probiotic Product (FLORAJEN3 PO) Take 1 tablet by mouth daily.  . sacubitril-valsartan (ENTRESTO) 24-26 MG Take 1 tablet by mouth 2 (two) times daily.  . simvastatin (ZOCOR) 80 MG tablet Take 0.5 tablets (40 mg total) by mouth at bedtime.  Marland Kitchen spironolactone (ALDACTONE) 25 MG tablet Take 0.5 tablets (12.5 mg total) by mouth daily.  . VENTOLIN HFA 108 (90 Base) MCG/ACT inhaler INHALE 2 PUFFS BY MOUTH EVERY 4 TO 6 HOURS AS NEEDED FOR WHEEZING.  . [DISCONTINUED] albuterol (PROVENTIL) (2.5 MG/3ML) 0.083% nebulizer solution Take 3 mLs (2.5 mg total) by nebulization 3 (three) times daily.  . predniSONE (DELTASONE) 20 MG tablet Take as directed  . sulfamethoxazole-trimethoprim (BACTRIM DS,SEPTRA DS) 800-160 MG tablet Take 1 tablet by mouth 2 (two) times daily.   No facility-administered encounter medications on file as of 06/24/2017.     Allergies  Allergen Reactions  . Neomycin Hives    Immunization History  Administered Date(s) Administered  . H1N1 08/06/2008  . Influenza Split 06/24/2013  . Influenza,inj,Quad PF,6+ Mos 06/15/2014, 05/31/2015, 05/09/2016, 06/06/2017  . Influenza-Unspecified 05/27/2012  . Pneumococcal Conjugate-13 08/18/2014  . Pneumococcal Polysaccharide-23 03/27/2012  . Zoster 07/27/2008    Current  Medications, Allergies, Past Medical History, Past Surgical History, Family History, and Social History were reviewed in [employer] record.   Review of Systems             All symptoms NEG except where BOLDED >>  Constitutional:  F/C/S, fatigue, anorexia, unexpected weight change. HEENT:  HA, visual changes, hearing loss, earache, nasal symptoms, sore throat, mouth sores, hoarseness. Resp:  cough, sputum, hemoptysis; SOB, tightness, wheezing. Cardio:  CP, palpit, DOE, orthopnea, edema. GI:  N/V/D/C, blood in stool; reflux, abd pain, distention, gas. GU:  dysuria, freq, urgency, hematuria, flank pain, voiding difficulty. MS:  joint pain, swelling, tenderness, decr ROM; neck pain, back pain, etc. Neuro:  HA, tremors, seizures, dizziness, syncope, weakness, numbness, gait abn. Skin:  suspicious lesions or skin rash. Heme:  adenopathy, bruising, bleeding. Psyche:  confusion, agitation, sleep disturbance, hallucinations, anxiety, depression suicidal.   Objective:   Physical Exam       Vital Signs:  Reviewed...   General:  WD, WN, 76 y/o WM in NAD; alert & oriented; pleasant & cooperative... HEENT:  Paul Sandoval/AT; Conjunctiva- pink, Sclera- nonicteric, EOM-wnl, PERRLA, EACs-clear, TMs-wnl; NOSE-clear; THROAT-clear & wnl.  Neck:  Supple w/ fair ROM; no JVD; normal carotid impulses w/ faint bruits; no thyromegaly or nodules palpated; no lymphadenopathy.  Chest:  decr BS right base & clear w/o w/r/r & no signs of consolid. Heart:  Regular Rhythm; norm S1 & S2 w/ Gr1/6 SEM without rubs or  gallops detected. Abdomen:  Soft & nontender- no guarding or rebound; normal bowel sounds; no organomegaly or masses palpated. Ext:  Sl decr ROM; without deformities +arthritic changes; no varicose veins, +venous insuffic, tr edema;  Pulses decr w/o bruits. Neuro:  CNs II-XII intact; motor testing normal; sensory testing normal; gait normal & balance OK. Derm:  No lesions noted; no rash  etc. Lymph:  No cervical, supraclavicular, axillary, or inguinal adenopathy palpated.   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     ASPVD- s/p AAA stent graft 11/2012, mod bilat carotid occlusive dis (asymptomatic) w/ CDopplers showing ~50-60% bilat ICAstenoses>  Stable & seen by [facility] 01/2016- plans yearly f/u CT Abd and CDopplers    MEDICAL issues>  HBP, HL, GERD/ Divertic/ Polyps, CKD-stage3, Anemia & thrombocytopenia>  On Simva80-1/2Qd, Prilosec20/d, Fe/ MVI/ etc...  PLAN>>  02/29/16>   Problem list as above w/ signif cardiac, pulmonary, & post chemoradiation changes;  From the pulm standpoint- he feels the BREO is helping & hasn't needed the rescue inhaler very often, continue same;  He is on a long PRED taper per DrManning for poss radiation fibrosis RUL, Sed rate is 53, continue this taper & careful w/ sweets/ sugar etc;  From the cardiac standpoint- he has signif underlying dis w/ Lmain stent, s/p CABG 20 yrs ago, prob ischemic cardiomyopathy w/ worsening 2DEcho recently w/ 30-35% EF + HK&AK seen, BNP=1053, Cards plans ischemic work up when able, in the meanwhile he has improved w/ diuresis but Chems w/ mild RI & HCO3=36... REC- add DIAMOX-ER 500 one tab daily at 4pm, continue his Lasix40 Qam, increase water intake, watch weights... We plan ROV 47monthrecheck... 04/04/16>   Discussed w/  Paul Sandoval-- continue the Levaquin til gone, use Tylenol for fever or pain; continue the Breo daily & Ventolin-HFA as needed; wean off the Diamox at this time, continue Lasix40, no salt, etc; he needs to gradually increase his exercise/ mobility; we will plan ROV w/ labs in 2109mo 06/06/16>   He is feeling better, performance status improving, CXR shows progressive changes in RUL s/p chemoradiation; CT=>PET scans w/ vol loss & persistent hypermetabolism/ evolving changes in RUL & right hilum plus he has a mod large right effusion; prev thoracentesis 01/2016 in hosp was transudative- I believe he would benefit from further eval/ repeat thoracentesis to recheck this fluid; we will set this up via IR & ask them to drain the fluid as much as poss, send it for Cytology, cell ct & diff, TProt/ LDH/ Gluc => 1.2L removed, prob exudate, NEG cytology, & we will follow... 08/06/16>   BiElwins clinically stable, no clear evid of recurrent dis & followed very closely by DrMohamed; Pulm stable on Symbicort Bid, Albut rescue prn, and his exercise- continue same 7 we plan rov recheck in 48m31mo12/18>    BilElyas doing satis on  Symbicort160-2spBid & Albut rescue inhaler prn; he states that he is active, energy is good, & that he's getting stronger & better (good performance status); I suspect that he has residual cancer in the right chest but it appears slowly progressive (w/ sl incr right effusion & subcarinal LN) and DrMohamed to review status & decide regarding additional therapy at this point vs continued observation...  01/02/17>   Paul Sandoval is improved but it's been a slow process; he is at high risk for cancer progression & followed closely by DrMohamed who plans f/u CT & recheck in June;  Continue current meds, add Guaifenesin 2469m/d + water to aid in mucus production... We will recheck in 253mo review his f/u scans planned by Oncology. 02/04/17>   SEE ABOVE - Rx Pred taper & Levaquin=> no better; R-Thoracentesis by IR w/ only 280cc  exudative loculated fluid removed, cytology- NEG; Limited options at this point- Rx w/ Augmentin empirically, add NEBs w/ Albuterol Tid, continue GFN 240018m;  We will check 2DEcho and refer to TCTS to consider further Rx options for right effusion... 03/13/17>   The worsening LVF w/ EF 20-67-61% certainly playing a roll in his unrespolved dyspnea=> refer to CHF clinic for active management; in the interim continue NEBs, Symbicort, Mucinex, etc.. 04/24/17>   BilCherylpears stable post T-surg & improved w/ CHF-clinic Rx per DrMcLean;  OK 2018 FLU shot today, and continue pulm rx w/ Albut NEBS Tid, Symbicort160-2spBid, GFN400-2Bid;  Call for any questions and we plan f/u visit in 51mo55mo/29/18>   As above- we discussed treating THIs COPD exac w/ BacterimDS & PRED taper; he is to continue w/ his NEBS, Symbicort, GFN and we plan recheck in 6wks time   Plan:     Patient's Medications  New Prescriptions   PREDNISONE (DELTASONE) 20 MG TABLET    Take as directed   SULFAMETHOXAZOLE-TRIMETHOPRIM (BACTRIM DS,SEPTRA DS) 800-160 MG TABLET    Take 1 tablet by mouth 2 (two) times daily.  Previous Medications   ACETAMINOPHEN (TYLENOL) 500 MG TABLET    Take 500 mg by mouth every 6 (six) hours as needed for mild pain.   ASPIRIN EC 81 MG TABLET    Take 81 mg by mouth daily before breakfast.    BUDESONIDE-FORMOTEROL (SYMBICORT) 160-4.5 MCG/ACT INHALER    Inhale 2 puffs into the lungs 2 (two) times daily.   CARVEDILOL (COREG) 6.25 MG TABLET    Take 1 tablet (6.25 mg total) by mouth 2 (two) times daily.   CLOPIDOGREL (PLAVIX) 75 MG TABLET    TAKE ONE TABLET BY MOUTH DAILY.   FERROUS SULFATE (IRON) 28 MG TABS    Take 28 mg by mouth daily.   FUROSEMIDE (LASIX) 40 MG TABLET    Take 40 mg by mouth daily before breakfast.   GUAIFENESIN (MUCUS RELIEF ADULT PO)    Take 2 capsules by mouth 3 (three) times daily.   MULTIPLE VITAMINS-MINERALS (CENTRUM SILVER ADULT 50+) TABS    Take 1 tablet by mouth daily with lunch.     NITROSTAT 0.4 MG SL TABLET    PLACE 1 TAB UNDER TONGUE EVERY 5 MIN IF NEEDED FOR CHEST PAIN. MAY USE 3 TIMES.NO RELIEF CALL 911.   OMEPRAZOLE (PRILOSEC) 20 MG CAPSULE    TAKE ONE CAPSULE BY MOUTH DAILY.   PROBIOTIC PRODUCT (FLORAJEN3 PO)    Take 1 tablet by mouth daily.   SACUBITRIL-VALSARTAN (ENTRESTO) 24-26 MG    Take 1 tablet by mouth 2 (two) times daily.  SIMVASTATIN (ZOCOR) 80 MG TABLET    Take 0.5 tablets (40 mg total) by mouth at bedtime.   SPIRONOLACTONE (ALDACTONE) 25 MG TABLET    Take 0.5 tablets (12.5 mg total) by mouth daily.   VENTOLIN HFA 108 (90 BASE) MCG/ACT INHALER    INHALE 2 PUFFS BY MOUTH EVERY 4 TO 6 HOURS AS NEEDED FOR WHEEZING.  Modified Medications   Modified Medication Previous Medication   ALBUTEROL (PROVENTIL) (2.5 MG/3ML) 0.083% NEBULIZER SOLUTION albuterol (PROVENTIL) (2.5 MG/3ML) 0.083% nebulizer solution      Take 3 mLs (2.5 mg total) by nebulization 4 (four) times daily.    Take 3 mLs (2.5 mg total) by nebulization 3 (three) times daily.  Discontinued Medications   No medications on file

## 2017-07-11 DIAGNOSIS — J449 Chronic obstructive pulmonary disease, unspecified: Secondary | ICD-10-CM | POA: Diagnosis not present

## 2017-07-18 DIAGNOSIS — J449 Chronic obstructive pulmonary disease, unspecified: Secondary | ICD-10-CM | POA: Diagnosis not present

## 2017-07-24 NOTE — Addendum Note (Signed)
Addended by: Lianne Cure A on: 07/24/2017 12:56 PM   Modules accepted: Orders

## 2017-07-28 ENCOUNTER — Other Ambulatory Visit: Payer: Self-pay | Admitting: Family Medicine

## 2017-07-30 ENCOUNTER — Telehealth: Payer: Self-pay | Admitting: Pulmonary Disease

## 2017-07-30 NOTE — Telephone Encounter (Signed)
Per SN: 1- continue all meds and mucinex w/ fluids, 2- work to expectorate the phlegm, 3- we need a good early AM specimen for C&S ASAP.  Thanks.

## 2017-07-30 NOTE — Telephone Encounter (Signed)
Called and spoke with pt and he stated that he completed the abx that he was on 11/27 and he has 3 days left on the pred.  He stated that this morning he started with a temp of 99 and it has stayed there.  He is coughing and getting up lots of green/yellow sputum.  He is taking the mucinex as prescribed by SN.  Wanting to know what else he can do or does he need to be seen?  SN please advise. Thanks  Allergies  Allergen Reactions  . Neomycin Hives

## 2017-07-30 NOTE — Telephone Encounter (Signed)
Spoke with pt giving him the recommendations from Dr. Lenna Gilford. Pt stated to me that he does not have a specimen container to put the sputum culture in once he gets it up. Told pt he could go to [facility] to get a specimen cup from them for the sputum culture.  Pt stated to me that he wanted to bring the sputum culture to our office after he gets it. Told pt that was fine. Nothing further needed.

## 2017-07-31 ENCOUNTER — Other Ambulatory Visit: Payer: Medicare Other

## 2017-07-31 ENCOUNTER — Other Ambulatory Visit: Payer: Self-pay

## 2017-07-31 DIAGNOSIS — J449 Chronic obstructive pulmonary disease, unspecified: Secondary | ICD-10-CM

## 2017-08-02 ENCOUNTER — Telehealth: Payer: Self-pay | Admitting: Pulmonary Disease

## 2017-08-02 ENCOUNTER — Encounter (HOSPITAL_COMMUNITY): Payer: Self-pay | Admitting: Cardiology

## 2017-08-02 ENCOUNTER — Ambulatory Visit (HOSPITAL_COMMUNITY)
Admission: RE | Admit: 2017-08-02 | Discharge: 2017-08-02 | Disposition: A | Discharge status: Home / Self Care | Payer: Medicare Other | Source: Ambulatory Visit | Attending: Cardiology | Admitting: Cardiology

## 2017-08-02 VITALS — BP 132/76 | HR 103 | Wt 184.4 lb

## 2017-08-02 DIAGNOSIS — I5022 Chronic systolic (congestive) heart failure: Secondary | ICD-10-CM | POA: Diagnosis not present

## 2017-08-02 DIAGNOSIS — Z8261 Family history of arthritis: Secondary | ICD-10-CM | POA: Insufficient documentation

## 2017-08-02 DIAGNOSIS — Z951 Presence of aortocoronary bypass graft: Secondary | ICD-10-CM | POA: Insufficient documentation

## 2017-08-02 DIAGNOSIS — Z8679 Personal history of other diseases of the circulatory system: Secondary | ICD-10-CM | POA: Insufficient documentation

## 2017-08-02 DIAGNOSIS — I2582 Chronic total occlusion of coronary artery: Secondary | ICD-10-CM | POA: Diagnosis not present

## 2017-08-02 DIAGNOSIS — C3490 Malignant neoplasm of unspecified part of unspecified bronchus or lung: Secondary | ICD-10-CM | POA: Diagnosis not present

## 2017-08-02 DIAGNOSIS — Z9221 Personal history of antineoplastic chemotherapy: Secondary | ICD-10-CM | POA: Diagnosis not present

## 2017-08-02 DIAGNOSIS — Z79899 Other long term (current) drug therapy: Secondary | ICD-10-CM | POA: Diagnosis not present

## 2017-08-02 DIAGNOSIS — I5042 Chronic combined systolic (congestive) and diastolic (congestive) heart failure: Secondary | ICD-10-CM | POA: Diagnosis not present

## 2017-08-02 DIAGNOSIS — Z820 Family history of epilepsy and other diseases of the nervous system: Secondary | ICD-10-CM | POA: Diagnosis not present

## 2017-08-02 DIAGNOSIS — Z7982 Long term (current) use of aspirin: Secondary | ICD-10-CM | POA: Insufficient documentation

## 2017-08-02 DIAGNOSIS — Z8249 Family history of ischemic heart disease and other diseases of the circulatory system: Secondary | ICD-10-CM | POA: Insufficient documentation

## 2017-08-02 DIAGNOSIS — I25810 Atherosclerosis of coronary artery bypass graft(s) without angina pectoris: Secondary | ICD-10-CM | POA: Insufficient documentation

## 2017-08-02 DIAGNOSIS — I2510 Atherosclerotic heart disease of native coronary artery without angina pectoris: Secondary | ICD-10-CM | POA: Diagnosis not present

## 2017-08-02 DIAGNOSIS — Z87891 Personal history of nicotine dependence: Secondary | ICD-10-CM | POA: Diagnosis not present

## 2017-08-02 DIAGNOSIS — Z9889 Other specified postprocedural states: Secondary | ICD-10-CM | POA: Insufficient documentation

## 2017-08-02 DIAGNOSIS — I130 Hypertensive heart and chronic kidney disease with heart failure and stage 1 through stage 4 chronic kidney disease, or unspecified chronic kidney disease: Secondary | ICD-10-CM | POA: Diagnosis not present

## 2017-08-02 DIAGNOSIS — N183 Chronic kidney disease, stage 3 (moderate): Secondary | ICD-10-CM | POA: Diagnosis not present

## 2017-08-02 DIAGNOSIS — Z7902 Long term (current) use of antithrombotics/antiplatelets: Secondary | ICD-10-CM | POA: Diagnosis not present

## 2017-08-02 DIAGNOSIS — E785 Hyperlipidemia, unspecified: Secondary | ICD-10-CM | POA: Insufficient documentation

## 2017-08-02 DIAGNOSIS — Z801 Family history of malignant neoplasm of trachea, bronchus and lung: Secondary | ICD-10-CM | POA: Insufficient documentation

## 2017-08-02 DIAGNOSIS — J449 Chronic obstructive pulmonary disease, unspecified: Secondary | ICD-10-CM | POA: Insufficient documentation

## 2017-08-02 DIAGNOSIS — I255 Ischemic cardiomyopathy: Secondary | ICD-10-CM | POA: Diagnosis not present

## 2017-08-02 LAB — BASIC METABOLIC PANEL
ANION GAP: 8 (ref 5–15)
BUN: 18 mg/dL (ref 6–20)
CALCIUM: 8.9 mg/dL (ref 8.9–10.3)
CO2: 28 mmol/L (ref 22–32)
Chloride: 99 mmol/L — ABNORMAL LOW (ref 101–111)
Creatinine, Ser: 1.43 mg/dL — ABNORMAL HIGH (ref 0.61–1.24)
GFR, EST AFRICAN AMERICAN: 53 mL/min — AB (ref >60)
GFR, EST NON AFRICAN AMERICAN: 46 mL/min — AB (ref >60)
Glucose, Bld: 116 mg/dL — ABNORMAL HIGH (ref 65–99)
Potassium: 4 mmol/L (ref 3.5–5.1)
Sodium: 135 mmol/L (ref 135–145)

## 2017-08-02 MED ORDER — SPIRONOLACTONE 25 MG PO TABS: 25.0000 mg | ORAL_TABLET | Freq: Every day | ORAL | 3 refills | Status: DC

## 2017-08-02 MED ORDER — CARVEDILOL 6.25 MG PO TABS: 9.3750 mg | ORAL_TABLET | Freq: Two times a day (BID) | ORAL | 3 refills | Status: DC

## 2017-08-02 NOTE — Telephone Encounter (Signed)
Spoke with pt letting him know that it does not look like the results of his sputum culture have come back yet but once we received the results of the culture, we would call him to let him know what the results are. Pt expressed understanding. Nothing further needed.

## 2017-08-02 NOTE — Patient Instructions (Addendum)
Increase Spironolactone 25 mg (1 tab) daily  Increase Carvedilol 9.375 mg (1.5 tabs), twice a day  Labs drawn today (if we do not call you, then your lab work was stable)   Your physician recommends that you return for lab work in: 10 days Rx given   Your physician recommends that you schedule a follow-up appointment in: 2 months with Dr. Aundra Dubin

## 2017-08-04 LAB — RESPIRATORY CULTURE OR RESPIRATORY AND SPUTUM CULTURE
MICRO NUMBER: 81368045
SPECIMEN QUALITY: ADEQUATE

## 2017-08-04 NOTE — Progress Notes (Signed)
PCP: Dr. Wolfgang Phoenix Pulmonary: Dr. Lenna Gilford HF Cardiology: Dr. Aundra Dubin  76 yo with history of COPD, CAD s/p CABG in 1996 and PCI to dLM and SVG-D in 7/15, ischemic cardiomyopathy, and non-small cell lung cancer presents for followup of CHF and dyspnea.  He was initially referred to [place] by Dr. Lenna Gilford after fall in EF and worsening dyspnea was noted.   Patient reports that his initial symptoms prior to CABG in 1996 were dyspnea.  He has never had any worrisome chest pain.  He did well initially after CABG and was golfing regularly and walking 2-3 miles/day up until around 3/17.  He had one episode of increased dyspnea in 2015 and had cath with PCI to distal LM and SVG-D.  In 3/17, he was diagnosed with NSCLC and was treated with radiation and chemotherapy.  Since then, he has had progressive exertional dyspnea.  He was noted on echo in 7/18 to have EF down to 20-25%.    RHC/LHC was done in 9/18, showing occluded SVG-small D, patent LIMA-LAD and SVG-PDA, 50% shelf-like stenosis distal left main (no intervention), filling pressure not elevated and cardiac output preserved.   He has had acute bronchitis/COPD flare recently and has been on a prednisone course (recently completed).  He is able to walk 2 miles but has to rest after the first mile.  No problems shopping in the grocery store or doing chores around the house.  He can mow the grass but gets short of breath with weed-eating.  No orthopnea/PND.  No chest pain.    Labs (9/17): LDL 90 Labs (7/18): K 3.5, creatinine 1.28, hgb 10.7 Labs (8/18): LDL 68 Labs (9/18): K 4.1, creatinine 1.2, hgb 11.1, plts 98 K Labs (10/18): K 4.1, creatinine 1.5  ECG (personally reviewed): NSR, RBBB, inferolateral TWIs  PMH: 1. COPD: PFTs (6/17) with severe obstructive airways disease.  2. AAA: s/p repair with stent graft.  3. Non-small cell lung cancer: Stage IIIA, diagnosed 3/17.  He had radiation as well as chemotherapy with carboplatin and paclitaxel.   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 PNA in 6/17 6. Recurrent right pleural effusion: VATS with talc pleurodesis on right.  No malignant cells noted.  7. HTN 8. H/o THR 9. CAD: CABG 1996 with SVG-D, SVG-PDA, LIMA-LAD.   - LHC (7/15): Totally occluded RCA and LAD.  Severe distal LM stenosis.  Patent SVG-PDA, patent LIMA-LAD.  Severe disease SVG-D.  Patient had DES to distal left main and staged PCI of SVG-D.   - Cardiolite (7/17): EF 30%, prior infarction with no ischemia.  - LHC (9/18): occluded SVG-small D, patent LIMA-LAD and SVG-PDA, 50% shelf-like stenosis distal left main (no intervention).  10. Chronic systolic CHF: Ischemic cardiomyopathy.   - Echo (6/17): EF 30-35%. - Echo (7/18): EF 20-25%, moderate RV dilation with mildly decreased RV systolic function.  - RHC (9/18): mean RA 3, PA 28/9 mean 18, mean PCWP 10, CI 4.05.   Social History   Socioeconomic History  . Marital status: Married    Spouse name: Not on file  . Number of children: 1  . Years of education: Not on file  . Highest education level: Not on file  Social Needs  . Financial resource strain: Not on file  . Food insecurity - worry: Not on file  . Food insecurity - inability: Not on file  . Transportation needs - medical: Not on file  . Transportation needs - non-medical: Not on file  Occupational History  . Occupation: Retired  Comment: [employer]  Tobacco Use  . Smoking status: Former Smoker    Packs/day: 1.50    Years: 30.00    Pack years: 45.00    Types: Cigarettes    Start date: 12/01/1956    Last attempt to quit: 01/08/1995    Years since quitting: 22.5  . Smokeless tobacco: Never Used  Substance and Sexual Activity  . Alcohol use: Yes    Alcohol/week: 0.0 oz    Comment: 03/18/2014 "no alacohol since 1996"  . Drug use: No  . Sexual activity: No  Other Topics Concern  . Not on file  Social History Narrative  . Not on file   Family History  Problem Relation Age of Onset  . Heart disease Father   . Cancer  Father        Lung  . Arthritis Mother   . Parkinsonism Mother   . Arthritis Sister        Brother with rheumatoid arthritis  . Hypertension Brother   . Colon cancer Neg Hx   . Colon polyps Neg Hx    ROS: All systems reviewed and negative except as per HPI.   Current Outpatient Medications  Medication Sig Dispense Refill  . acetaminophen (TYLENOL) 500 MG tablet Take 500 mg by mouth every 6 (six) hours as needed for mild pain.    Marland Kitchen albuterol (PROVENTIL) (2.5 MG/3ML) 0.083% nebulizer solution Take 3 mLs (2.5 mg total) by nebulization 4 (four) times daily. 360 mL 11  . aspirin EC 81 MG tablet Take 81 mg by mouth daily before breakfast.     . budesonide-formoterol (SYMBICORT) 160-4.5 MCG/ACT inhaler Inhale 2 puffs into the lungs 2 (two) times daily.    . clopidogrel (PLAVIX) 75 MG tablet TAKE ONE TABLET BY MOUTH DAILY. 90 tablet 2  . Ferrous Sulfate (IRON) 28 MG TABS Take 28 mg by mouth daily.    . furosemide (LASIX) 40 MG tablet Take 40 mg by mouth daily before breakfast.    . GuaiFENesin (MUCUS RELIEF ADULT PO) Take 2 capsules by mouth 3 (three) times daily.    . Multiple Vitamins-Minerals (CENTRUM SILVER ADULT 50+) TABS Take 1 tablet by mouth daily with lunch.     Marland Kitchen NITROSTAT 0.4 MG SL tablet PLACE 1 TAB UNDER TONGUE EVERY 5 MIN IF NEEDED FOR CHEST PAIN. MAY USE 3 TIMES.NO RELIEF CALL 911. 25 tablet 1  . omeprazole (PRILOSEC) 20 MG capsule TAKE ONE CAPSULE BY MOUTH DAILY. 90 capsule 0  . Probiotic Product (FLORAJEN3 PO) Take 1 tablet by mouth daily.    . sacubitril-valsartan (ENTRESTO) 24-26 MG Take 1 tablet by mouth 2 (two) times daily. 60 tablet 3  . simvastatin (ZOCOR) 80 MG tablet Take 0.5 tablets (40 mg total) by mouth at bedtime. 15 tablet 5  . sulfamethoxazole-trimethoprim (BACTRIM DS,SEPTRA DS) 800-160 MG tablet Take 1 tablet by mouth 2 (two) times daily. 20 tablet 0  . VENTOLIN HFA 108 (90 Base) MCG/ACT inhaler INHALE 2 PUFFS BY MOUTH EVERY 4 TO 6 HOURS AS NEEDED FOR WHEEZING. 18  g 0  . carvedilol (COREG) 6.25 MG tablet Take 1.5 tablets (9.375 mg total) by mouth 2 (two) times daily with a meal. 90 tablet 3  . spironolactone (ALDACTONE) 25 MG tablet Take 1 tablet (25 mg total) by mouth daily. 30 tablet 3   No current facility-administered medications for this encounter.    BP 132/76   Pulse (!) 103   Wt 184 lb 6.4 oz (83.6 kg)   SpO2  96%   BMI 26.46 kg/m  General: NAD Neck: No JVD, no thyromegaly or thyroid nodule.  Lungs: Decreased breath sounds right base.  CV: Nondisplaced PMI.  Heart regular S1/S2, no S3/S4, no murmur.  No peripheral edema.  No carotid bruit.  Normal pedal pulses.  Abdomen: Soft, nontender, no hepatosplenomegaly, no distention.  Skin: Intact without lesions or rashes.  Neurologic: Alert and oriented x 3.  Psych: Normal affect. Extremities: No clubbing or cyanosis.  HEENT: Normal.   Assessment/Plan: 1. Chronic systolic CHF: Ischemic cardiomyopathy.  He has had a steady fall in EF on imaging studies, most recently had echo in 7/18 with EF 20-25%.  NYHA class III symptoms with worsened dyspnea over the last year, I think that most of his symptoms are due to COPD.  On exam, he is not volume overloaded. 9/18 RHC showed normal filling pressures - Increase Coreg to 9.375 mg bid.  - Continue Entresto 24/26 bid.    - Increase spironolactone to 25 mg daily. BMET today and again in BMET in 10 days. 2. CAD: s/p CABG then PCI 7/15 to distal LM and SVG-D.  Anginal equivalent in the past appears to have been dyspnea, he has never had significant chest pain.  LHC (9/18) showed patent LIMA-LAD and SVG-PDA; occluded SVG-D; 50% shelf-like LM stenosis - Continue ASA 81, Plavix, and simvastatin.  3. COPD: Severe COPD by PFTs in 6/17.  Based on the recent RHC/LHC, I suspect that COPD plays a major role in his ongoing dyspnea.   4. Hyperlipidemia: Good lipids in 8/18.  5. NSCLC: Follows with Dr. Earlie Server.  He has finished chemo/radiation and will get scans to  assess for residual disease. He does not appear to have had any significantly cardiotoxic agents with chemotherapy.  6. Exertional dyspnea: With normal filling pressures on RHC, I think that COPD plays a major role in his dyspnea.  7. Right pleural effusion: This has been chronic. S/p VATS.   Followup in 2 months.   Loralie Champagne 08/04/2017

## 2017-08-05 ENCOUNTER — Ambulatory Visit: Payer: Medicare Other | Admitting: Pulmonary Disease

## 2017-08-08 ENCOUNTER — Encounter: Payer: Self-pay | Admitting: Pulmonary Disease

## 2017-08-08 ENCOUNTER — Ambulatory Visit: Payer: Medicare Other | Admitting: Pulmonary Disease

## 2017-08-08 VITALS — BP 118/58 | HR 93 | Temp 97.8°F | Ht 70.0 in | Wt 183.2 lb

## 2017-08-08 DIAGNOSIS — J449 Chronic obstructive pulmonary disease, unspecified: Secondary | ICD-10-CM

## 2017-08-08 DIAGNOSIS — J90 Pleural effusion, not elsewhere classified: Secondary | ICD-10-CM | POA: Diagnosis not present

## 2017-08-08 DIAGNOSIS — C3411 Malignant neoplasm of upper lobe, right bronchus or lung: Secondary | ICD-10-CM

## 2017-08-08 MED ORDER — METHYLPREDNISOLONE 8 MG PO TABS: 8.0000 mg | ORAL_TABLET | Freq: Every day | ORAL | 0 refills | Status: DC

## 2017-08-08 MED ORDER — AMOXICILLIN-POT CLAVULANATE 875-125 MG PO TABS: 1.0000 | ORAL_TABLET | Freq: Two times a day (BID) | ORAL | 0 refills | Status: DC

## 2017-08-08 MED ORDER — METHYLPREDNISOLONE ACETATE 80 MG/ML IJ SUSP
80.0000 mg | Freq: Once | INTRAMUSCULAR | Status: AC
  Administered 2017-08-08: 80 mg via INTRAMUSCULAR

## 2017-08-08 MED ORDER — IPRATROPIUM-ALBUTEROL 0.5-2.5 (3) MG/3ML IN SOLN: 3.0000 mL | Freq: Four times a day (QID) | RESPIRATORY_TRACT | 5 refills | Status: DC

## 2017-08-08 NOTE — Patient Instructions (Signed)
Today we updated your med list in our EPIC system...    Continue your current medications the same...  We wrote for a new antibiotic> AUGMENTIN 875mg  #20 take one tab twice daily for 10 days...    Be sure to take a probiotic like ALIGN (OTC) daily as well...  We wrote for another course of a cortisone anti-inflammatory medication> MEDROL 8mg  tabs>>    Start w/ one tab twice daily for 1 week...    Then decrease to one tab each AM for 1 week...    Then decrease to 1/2 tab daily in the AM for 1 week...    Then decrease to 1/2 tab every other day til return visit (1/2, 0, 1/2, 0, etc)...  Continue the NEBULIZE 4 times daily w/ the new medication-- DUONEB...  Continue the Symbicort- 2 sprays twice daily...  Continue the GUAIFENESIN 400mg  tabs- 2 tabs three times daily w/ lots of water...  Call for any questions...  Let's plan a follow up visit in 6wks, sooner if needed for problems.Marland KitchenMarland Kitchen

## 2017-08-10 ENCOUNTER — Encounter: Payer: Self-pay | Admitting: Pulmonary Disease

## 2017-08-10 NOTE — Progress Notes (Signed)
Subjective:     Patient ID: Paul Sandoval, male   DOB: 1941/01/04, 76 y.o.   MRN: [id number]  HPI  ~  February 29, 2016:  Paul Sandoval w/ SN>  Paul Sandoval is a 76 y/o WF w/ mult medical problems as noted below who has seen DrAlva & DrMannam previously;  I have reviewed his extensive epic records and recent Paul Sandoval x2 in June2017 and formulated the following PROBLEM LIST>>  His PCP is DrLuking in [place]... He reminded me that I cared for his dad in 64...    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    ~  [place]>  Fever, cough, increased RUL opac (PCT<0.10, he received 10d Levaquin) & right>left effusion w/ thoracentesis 02/22/16 removing 1200cc clear yellow fluid, prob transudate, & BNP was >900; meds adjusted & he was diuresed w/ improvement but 2DEcho revealed worsening CHF w/ EF ~30-35% + HK & AK => he has f/u pending w/ Cards...    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    ASPVD- s/p AAA stent graft 11/2012, mod bilat carotid occlusive dis (asymptomatic) w/ CDopplers showing ~50-60% bilat ICAstenoses>  [facility] plans yearly f/u CT Abd and CDopplers    MEDICAL issues>  HBP, HL, GERD/ Divertic/ Polyps, CKD-stage3, Anemia & thrombocytopenia>  On Simva80-1/2Qd, Prilosec20/d, Fe/ MVI/ etc... Since he was disch 02/25/16>  He reports feeling better, breathing better, still sl SOB w/ activ but not requiring O2, mild cough w/ beige sput, no hemoptysis, denies CP=> he is due for f/u CXR & blood work post hosp... EXAM shows Afeb, VSS, O2sat=95% on RA after walking back; Wt=180#;  HEENT- neg, mallampati2;  Chest- sl decr BS right base, few rhonchi, w/o w/r/consolidation;  Heart- RR Gr1/6SEM w/o r/g;  Abd- soft, neg;  Ext- neg w/o c/c/e...  PFT 02/01/16>  FVC=1.96 (45%), FEV1=1.25 (40%), %1sec=64, mid-flows were reduced at 28% predicted; post bronchodil there was a 31% improvement in FEV1 to 1.64;  TLC=5.96 (83%, RV=3.89 (  149%), RV/TLC=65%;  DLCO=58% pred.  This is c/w moderate to severe airflow obstruction, GOLD Stage 3 COPD w/ a signif asthmatic component, air trapping, and decr diffusion capacity...  2DEcho 02/22/16>  Mild conc LVH w/ decr LVF & EF=30-35% w/ diffuse HK & AK in several walls (see report), Gr2 DD, mild MR otherw norm valves, mild LAdil (82m), norm RV function & PA pressures  LABS 01/2016 Hosp>  Chems- ok x Cr=1.3-1.4, BS=120-140, Alb=2.8;  CBC- anemic w/ Hg=8-9 range, Plat~130K range;  TSH=0.92  CT Chest 02/21/16>  Norm heart size, extensive coronary calcif, s/p CABG, decr size of the pathological  right paratracheal LN, decr size of RUL mass (now ~2cm & prev ~4cm), new confluent airsp opac w/ air bronchograms in RUL w/ large right effusion; s/p GB, +HH, Abd Ao stent graft, DJD & DISH w/o metastatic lesions  CT Angio Chest 02/24/16>  NEG for PE, s/pCABG & stent in Lmain, norm heart size, no pericard fluid, small right pleural effusion, architectural distortion & interstitial opacities in RUL c/w XRT changes, RUL nodule measures ~2cm, no adenopathy reported...   CXR 02/29/16>  Stable heart size, tortuous Ao, s/p CABG, sl improvement in interstitial opac in RUL area & posteromedial RUL nodule  LABS 02/29/16>  Chems- ok x HCO3=36, BS=120, BUN/Cr=30/1.40;  CBC- Hg=10.9, Plat=249K, WBC=18.2 (on Pred);  BNP=1053;  Sed=53 IMP/PLAN>>  Problem list as above w/ signif cardiac, pulmonary, & post chemoradiation changes;  From the pulm standpoint- he feels the BREO is helping & hasn't needed the rescue inhaler very often, continue same;  He is on a long PRED taper per DrManning for poss radiation fibrosis RUL, Sed rate is 53, continue this taper & careful w/ sweets/ sugar etc;  From the cardiac standpoint- he has signif underlying dis w/ Lmain stent, s/p CABG 20 yrs ago, prob ischemic cardiomyopathy w/ worsening 2DEcho recently w/ 30-35% EF + HK&AK seen, BNP=1053, Cards plans ischemic work up when able, in the meanwhile he has improved w/ diuresis but Chems w/ mild RI & HCO3=36... REC- add DIAMOX-ER 500 one tab daily at 4pm, continue his Lasix40 Qam, increase water intake, watch weights... We plan ROV [id number]...  ~  April 04, 2016:  62m69moV w/ SN>> Paul Sandoval the [facility] symptom [facility] clinic 8/4 w/ intermittent cough, wheezing, fever; they did a f/u CXR interpreted as showing a right base opac suspicious for acute pneumonia but this is a soft finding on my review of the XRay, and the prev RUL reticulonod opac is improved (radiation fibrosis improved w/ Pred taper); placed on Levaquin Rx;  He denies sput  production, hemoptysis, CP, or SOB but he is fairly sedentary.    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    ~  [place]>  Fever, cough, increased RUL opac (PCT<0.10, he received 10d Levaquin) & right>left effusion w/ thoracentesis 02/22/16 removing 1200cc clear yellow fluid, prob transudate, & BNP was >900; meds adjusted & he was diuresed w/ improvement but 2DEcho revealed worsening CHF w/ EF ~30-35% + HK & AK => he has f/u pending w/ Cards...    ~  [facility]  He had right pleural effusion tapped 02/22/16=> Prob TRANSUDATE w/ TProt<3.0, LDH=95, Cytology=NEG (reactive mesothelial cells only);  Cults=> no growth    ~  03/2016 developed cough/ wheezing/ low grade fever=> CXR w/ improved RUL, ?incr markings R base, he was off the Pred, given more Levaquin...     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    ~  He saw CARDS PA 03/01/16> no change in meds, Lexiscan Myoview 03/06/16 showed hi risk study w/ EF=30%, no ST segm changes, c/w antero-apical MI & no evid reversible ischemia...    ASPVD- s/p AAA stent graft 11/2012, mod bilat carotid occlusive dis (asymptomatic) w/ CDopplers showing ~50-60% bilat ICAstenoses>  [facility] plans yearly f/u CT Abd and CDopplers    MEDICAL issues>  HBP, HL, GERD/ Divertic/ Polyps, CKD-stage3, Anemia & thrombocytopenia>  On Simva80-1/2Qd, Prilosec20/d, Fe/ MVI/ etc... EXAM shows Afeb, VSS, O2sat=99% on RA; Wt=178#;  HEENT- neg, mallampati2;  Chest- sl decr BS right base, few rhonchi, w/o w/r/consolidation;  Heart- RR Gr1/6SEM w/o r/g;  Abd- soft, neg;  Ext- neg w/o c/c/e...  Lexiscan Myoview 03/06/16 showed hi risk study w/ EF=30%, no ST segm changes, c/w antero-apical MI & no evid reversible ischemia...  CXR 03/30/16>  incr patchy opac at the right base, no effusion, RUL reticulonodular opac in RUL has regressed; norm heart size s/p CABG, abd Ao endograft part vis  LABS 03/28/16>  Chems- ok w/ K=4.0, HCO3=23, Cr=1.6, BS=145;  CBC- ok w/ Hg=11.4, wbc=6.1, plat=85K;   IMP/PLAN>>  Discussed w/ Paul Sandoval-- continue the Levaquin til gone, use Tylenol for fever or pain; continue the Breo daily & Ventolin-HFA as needed; wean off the Diamox at this time, continue Lasix40, no salt, etc; he needs to gradually increase his exercise/ mobility; we will plan ROV w/ labs in 218mo.  ~  June 06, 2016:  44m97moV w/ SN>  BilMarshunports that he is feeling better, gaining strength, walking 44mi54m mowing yard, and resting well at night;  He notes min hacking cough, no sput- no discoloration or blood, SOB is diminished & he denies CP, no f/c/s... He saw DrMohamed 05/16/16- pt remains on observation (s/p chemoradiation, 5 cycles w/ partial  response); repeat CT Chest 05/10/16 showed more confluent airsp dis in RUL w/ vol loss, abn soft tissue attenuation in right hilum has progressed (?felt to be treatment related), stable subcarinal LN ~11mm63mderate right effusion... DrMohamed felt this scan showed no concerning features of dis progression & they opted for continued observ  w/ f/u 53mo.. 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    ~  [place]>  Fever, cough, increased RUL opac (PCT<0.10, he received 10d Levaquin) & right>left effusion w/ thoracentesis 02/22/16 removing 1200cc clear yellow fluid, prob transudate, & BNP was >900; meds adjusted & he was diuresed w/ improvement but 2DEcho revealed worsening CHF w/ EF ~30-35% + HK & AK => he has f/u pending w/ Cards...    ~  [facility]  He had right pleural effusion tapped 02/22/16=> Prob TRANSUDATE w/ TProt<3.0, LDH=95, Cytology=NEG (reactive mesothelial cells only);  Cults=> no growth    ~  03/2016 developed cough/ wheezing/ low grade fever=> CXR w/ improved RUL, ?incr markings R base,  he was off the Pred, given more Levaquin...     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    ~  He saw CARDS PA 03/01/16> no change in meds, Lexiscan Myoview 03/06/16 showed hi risk study w/ EF=30%, no ST segm changes, c/w antero-apical MI & no evid reversible ischemia...    ASPVD- s/p AAA stent graft 11/2012, mod bilat carotid occlusive dis (asymptomatic) w/ CDopplers showing ~50-60% bilat ICAstenoses>  [facility] plans yearly f/u CT Abd and CDopplers    MEDICAL issues>  HBP, HL, GERD/ Divertic/ Polyps, CKD-stage3, Anemia & thrombocytopenia>  On Simva80-1/2Qd, Prilosec20/d, Fe/ MVI etc... EXAM shows Afeb, VSS, O2sat=95% on RA; Wt=183# (up 5#);  HEENT- neg, mallampati2;  Chest- sl decr BS right base, few rhonchi, w/o w/r/consolidation;  Heart- RR Gr1/6SEM w/o r/g;  Abd- soft, neg;  Ext- neg w/o c/c/e...  CT Chest 05/10/16 showed more confluent airsp dis in RUL w/ vol loss, abn soft tissue attenuation in right hilum has progressed (?felt to be treatment related), stable subcarinal LN ~119mmoderate right effusion.  CXR 06/06/16> progressive incr density in RUL & vol loss, soft tissue fullness in right hilum, s/p CABG and calcif in wall of Ao arch  PET scan 06/15/16> persistent but diminished hypermetabolism in the RUL area of interstitial & airsp opac-  likely related to XRT, no hypermetabolism in the prev R paratrachial LN, similar sized subcarinal LN w/ low level hypermetabolism noted; new area of LUL anterior  interstitial & airsp dis w/ some bronchiectasis shows low level hypermetabolism (?radiation change?); moderate right effusion...  IMP/PLAN>>  He is feeling better, performance status improving, CXR shows progressive changes in RUL s/p chemoradiation; CT=>PET scans w/ vol loss & persistent hypermetabolism/ evolving changes in RUL & right hilum plus he has a mod large right effusion; prev thoracentesis 01/2016 in hosp was transudative- I believe he would benefit from further eval/ repeat thoracentesis to recheck this fluid; we will set this up via IR & ask them to drain the fluid as much as poss, send it for Cytology, cell ct & diff, TProt/ LDH/ Gluc...  ~  August 06, 2016:  85moROV w/ SN>  BAldinreports that his SOB & energy have improved, appetite is better; he is more active walking 271m5d/wk + yard work/ mowing/ leaves/ etc; he notes min cough, small amt beige sput, stable DOE, no CP or edema... He tells me that DrMohamed has CT Chest & LABS ordered for next week so we will wait for these tests & review when avail...     COPD (Stage3 w/ signif revers component), former smoker (quit 1996 w/ ~40+ pack-yr hx)>  On Symbicort160-2spBid regularly ([facility] changed [facility]), VentolinHFA rescue inhaler as needed...    He saw DrCherly Hensenor CARDS 07/04/16>  HBP, CAD- s/p CABG 1996 & subseq PCI to graft vessels, chr combined sys&diast CHF w/ cardiomyopathy, PVD- w/ carotid dis & Ao stent graft per drLawson, HL, CKD;  No changes made to his ASA/ Plavix, Metoprolol50-1.5Bid, Lasix40, Simva40...     EXAM shows Afeb, VSS, O2sat=98% on RA; Wt=187# (up 4#);  HEENT- neg, mallampati2;  Chest- sl decr BS right base, few rhonchi, w/o w/r/consolidation;  Heart- RR Gr1/6SEM w/o r/g;  Abd- soft, neg;  Ext- neg w/o c/c/e...  Throacentesis 06/22/16 by IR>  1.2L removed (hazy yellow fluid)=> Cell ct=923 cells w/ 2 Neutro/ 58 lymph/ 40 mono-macrophages;  Chems- LDH=119, TPro=4.1, Gluc=101;  Cyto= Atypic cells c/w reactive mesothel cells.  Post  thoracentesis CXR 06/22/16>  decr right effusion, no pneumoth, dense consolid RUL unchanged, post-op BABG...  CT Chest 08/14/16>  Norm heart size- s/p CABG & Ao atherosclerotic changes; stable upper lim adenopathy; decr right effusion- now small in size; similar interstitial & airsp dis in right perihilar & medial LUL is c/w radiation fibrosis; no signif pulm nodules or masses- no new or progressive dis...   LABS 08/14/16>  Chems- ok x Cr=1.6;  CBC- ok w/ Hg=12.7...Marland KitchenMarland KitchenMP/PLAN>>  BiIzaihas clinically stable, no clear evid of recurrent dis & followed very closely by DrMohamed; Pulm stable on Symbicort Bid, Albut rescue prn, and his exercise- continue same & we plan rov recheck in 28m40mo  ~  November 05, 2016:  28mo38mo & Paul Sandoval indicates "stronger, better" on diet & exercise program w/ good energy (eg- climbed steps at a ball game);  He denies much cough (clear throat), small amt clear sput, no blood, SOB w/ exertion only- ADLs ok & DOE stable, no CP & he describes 2mi 25mk 5d/wk & he stays busy...  [facility] (sl incr subcarinal LN, scarring & radiation fibrosis, ?sl incr right effusion)... 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    ~  [place]>  Fever, cough, increased RUL opac (PCT<0.10, he received 10d Levaquin) & right>left effusion w/ thoracentesis 02/22/16 removing 1200cc clear yellow fluid, prob transudate, & BNP was >900; meds adjusted & he was diuresed w/ improvement but 2DEcho revealed worsening CHF w/ EF ~30-35% + HK & AK => he has f/u pending w/ Cards...    ~  [facility]  He had right pleural effusion tapped 02/22/16=> Prob TRANSUDATE w/ TProt<3.0, LDH=95, Cytology=NEG (reactive mesothelial cells only);  Cults=> no growth    ~  03/2016 developed cough/ wheezing/ low grade fever=> CXR w/ improved RUL, ?incr markings R base, he was off the Pred, given more Levaquin...     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    ~  He saw CARDS PA 03/01/16> no change in meds, Lexiscan Myoview 03/06/16 showed hi risk study w/ EF=30%, no ST segm changes, c/w antero-apical MI & no evid reversible ischemia...    ASPVD- s/p AAA stent graft 11/2012, mod bilat carotid occlusive dis (asymptomatic) w/ CDopplers showing ~50-60% bilat ICAstenoses>  [facility]- plans yearly f/u CT Abd and CDopplers    MEDICAL issues>  HBP, HL, GERD/ Divertic/ Polyps, CKD-stage3 (cr=1.6), Anemia (Hg=11.9) & thrombocytopenia (plat=171K)>  On Simva80-1/2Qd, Prilosec20/d, Fe/ MVI etc... EXAM shows Afeb, VSS, O2sat=98% on RA; Wt=189# (up 2#);  HEENT- neg,  mallampati2;  Chest- sl decr BS right base, few rhonchi, w/o w/r/consolidation;  Heart- RR Gr1/6SEM w/o r/g;  Abd- soft, neg;  Ext- neg w/o c/c/e...  CT Chest 10/30/16>  Norm heart size, s/p CABG, atherosclerosis in Ao; prev 154msubcarinal LN now measures 1376msl incr in right pleural effusion, radiation fibrosis & changes in medial right lung and ant LULare stable/ unchanged;   LABS 10/29/16>  Chems- wnl w/ Cr=1.6, LFTs= wnl;  CBC- ok w/ Hg=11.9, WBC=10.1  Ambulatory Oximetry 11/05/16>  O2sat=96% on RA at rest w/ pulse=81/min;  He ambulated 3 laps in office (185'each) w/ lowest O2sat=90% w/ pulse 99/min... IMP/PLAN>>  BilLarance doing satis on Symbicort160-2spBid & Albut rescue inhaler prn; he states that he is active, energy is good, & that he's getting stronger &  better (good performance status); I suspect that he has residual cancer in the right chest but it appears slowly progressive (w/ sl incr right effusion & subcarinal LN) and DrMohamed to review status & decide regarding additional therapy at this point vs continued observation... we plan rov recheck in 3-641mo  NOTE:  >50% of this 25 min rov was spent in counseling & coordination of care...  ~  Jan 02, 2017:  269moOV & Paul Sandoval called 4/24 c/o cough, yellow sput, low grade fever after having been Rx x2 by PCP w/ Doxy (note- Flu-B was pos in resp viral panel); we placed him on Levaquin & Medrol dosepak & he is some better- still congested and phlegm is beige but feeling better overall; we discussed addition of Mucinex 1200bid + fluids vs Guaifenesin 400-2Tid to save $$...     COPD> on Symbicort160-2spBid and Ventolin-HFA prn    Lung cancer- followed by DrMohamed & DrManning> RUL non-small cell ca IIIA, dx 3/17 & rx w/ Chemoradiation; subseq RUL opac (infection vs XRT-fibrosis & right effusion;  He saw DrMohamed 11/15/16- note reviewed & he was doing well at that time, playing golf & feeling good; CT Chest 10/30/16 w/ sl incr right effusion, sl incr  subcarinal LN from 10=>1362mradiation fibrosis areas stable; DrMohamed felt there was no clear sign of dis progression & rec f/u scans in 37mo837moe 01/2017)...    Cardiac- CAD, s/p CABG, Cardiomyopathy, CHF> on ASA/ Plavix, Metop25Bid, Lasix40; he saw DrNiCherly Hensen7/18- note reviewed; EF=30%, he mentioned more aggressive cardiac w/u & rx if cancer progrosis favorable (no changes made, f/u 34mo 9moDEcho)...    ASPVD- s/p AAA stent graft, bilat carotid dis> on ASA/ Plavix    Medical issues>  HBP, HL, GERD/ Divertic/ Polyps, CKD-stage3 (cr=1.6), Anemia (Hg=11.9) & thrombocytopenia (plat=171K)> EXAM shows Afeb, VSS, O2sat=98% on RA; Wt=183# (down 6#);  HEENT- neg, mallampati2;  Chest- sl decr BS right base, few rhonchi, w/o w/r/consolidation;  Heart- RR Gr1/6SEM w/o r/g;  Abd- soft, neg;  Ext- neg w/o c/c/e... IMP/PLAN>>  BillyDarrillmproved but it's been a slow process; he is at high risk for cancer progression & followed closely by DrMohamed who plans f/u CT & recheck in June;  Continue current meds, add Guaifenesin 2400mg/73mwater to aid in mucus production... We will recheck in 6mo & 41moew his f/u scans planned by Oncology.  ~  February 04, 2017:  41mo ROV69modd-on appt requested for cough, thick yellow sput, chest congestion w/ wheezing>  Notes low grade temp in the afternoon w/o c/s;  Denies CP/ palpit/ hemoptysis/ change in SOB/DOE; He is taking SYMBICORT160-2spBid, GFN 400-2Tid + fluids, plus rescue inhaler => we gave him a NEB Rx in office (min relief), sent sput for C&S (grew abundant NTF only); we discussed need for f/u CXR (CT due soon from Mohamed)Yolox w/ Levaquin/ Pred taper...    Non-small cell lung cancer, Stage IIIA, diagnosed 10/2015 & treated by DrMohamed & DrManning w/ Chemoradiation (carboplatin & paclitaxel)     ~  Bronch 11/17/15 by DrAlva sKallie Edwardno endobronchial lesions- TBBx were neg, but brushing & washings were pos for malig cells (felt to be c/w SqCellCa)    ~  He received ChemoRx from  DrMohamed with weekly carboplatin and paclitaxel status post 4 cycles finished 01/24/16 (dose held on several occas due to side effects)    ~  He received XRT 4/17 - 01/26/16 by DrManning to the primary tumor & involved mediastinal  adenopathy w/ 66 Gy (33 fractions of 2Gy each); he had mod esophagitis & some fatigue    Abnormal CXR w/ RUL mass, s/p treatment as above, & subseq RUL opac (?infection vs radiation fibrosis changes), right pleural effusion>  Treated w/ antibiotic Rx & PREDNISONE taper (30-25-20-15-10-5 Q5d over 46mo per DrManning...    ~  HFelicityx2 in 01/2016>  Fever, cough, increased RUL opac (PCT<0.10, he received 10d Levaquin) & right>left effusion w/ thoracentesis 02/22/16 removing 1200cc clear yellow fluid, prob transudate, & BNP was >900; meds adjusted & he was diuresed w/ improvement but 2DEcho revealed worsening CHF w/ EF ~30-35% + HK & AK => he has f/u pending w/ Cards...    ~  [facility]  He had right pleural effusion tapped 02/22/16=> Prob TRANSUDATE w/ TProt<3.0, LDH=95, Cytology=NEG (reactive mesothelial cells only);  Cults=> no growth    ~  03/2016 developed cough/ wheezing/ low grade fever=> CXR w/ improved RUL, ?incr markings R base, he was off the Pred, given more Levaquin...     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    ~  He saw CARDS PA 03/01/16> no change in meds, Lexiscan Myoview 03/06/16 showed hi risk study w/ EF=30%, no ST segm changes, c/w antero-apical MI & no evid reversible ischemia...    ASPVD- s/p AAA stent  graft 11/2012, mod bilat carotid occlusive dis (asymptomatic) w/ CDopplers showing ~50-60% bilat ICAstenoses>  [facility]- plans yearly f/u CT Abd and CDopplers    MEDICAL issues>  HBP, HL, GERD/ Divertic/ Polyps, CKD-stage3 (cr=1.6), Anemia (Hg=11.9) & thrombocytopenia (plat=171K)>  On Simva80-1/2Qd, Prilosec20/d, Fe/ MVI etc... EXAM shows Afeb, VSS, O2sat=97% on RA; Wt=179# (down 4#);  HEENT- neg, mallampati2;  Chest- sl decr BS right base, few rhonchi, w/o w/r/consolidation;  Heart- RR Gr1/6SEM w/o r/g;  Abd- soft, neg;  Ext- neg w/o c/c/e...  CXR 02/04/17 (independently reviewed by me in the PACS system)> ?loculated mod right pleural effusion, RUL airsp dis is unchanged & c/w post XRT changes; left lung is clear, stable cardiac silhouette & prior CABG  Sputum C&S> abundant NTF, no pathogen identified... IMP/PLAN>>  We discussed the need for another thoracentesis=> check cytology;  We will Rx w/ empiric LEVAQUIN & PREDNISONE taper, plus his GFN at max doses w/ fluids;  Also awaiting f/u CT Chest due soon...  ADDENDUM>> Thoracentesis by IR 02/11/17>  280cc of dark bloody fluid removed, due to loculation no additional fluid was obtainable;  Fluid= exudative. & Cytology= NEGATIVE... ADDENDUM>> f/u CT Chest done 02/13/17>  Norm heart size, aortic atherosclerosis, prev CABG; mod loculated right pleural effusion; mass-like architectural distortion in RUL c/w XRT, new area of airsp consolidation in RML~3cm, mult faint nodular densities scattered on the right lung, borderline subcarinal node; Abd & musculoskeletal are ok... ADDENDUM>>  02/15/17>  Pt indicates that he is ?no better, c/o persistent cough, congested, thick yellow sput, no hemoptysis, low grade temps 99-100 range, denies much CP (just sore from coughing) and SOB/DOE about the same;  We discussed his limited options here & have suggested the following--    1) keep PRED 84m/d for now;  Add empiric AUGMENTIN 879mone tab bid x10d  (plus Align/Activia);  Add NEBULIZER w/ ALBUTEROL TID regularly and space out the Symbicort Rx still at 2sp Bid...Marland KitchenMarland Kitchen  2) check 2DEcho to compare EF to 7/17 (30-35%)    3) refer to TCTS for the chr right pleural effusion to consider poss chest tube vs VATS vs decortication surg (DrBartle did his CABG yrs ago)  ~  July 18.2018:  44m66moV & post hosp visit>  BilChristians undergone an extensive work up over the last month or so w/ thoracentesis, CT Chest, TSurg consultation followed by VATS drainage & pleurosesis, and f/u 2DEcho showing worsening LVF=> referred to CHF clinic & there recommendations are pending...    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) presenting w/ RUL mass w/ mediastinal adenopathy 3/17;  He received chemoradiation w/ wkly carboplatin & paclitaxel (5 cycles w/ part response) + XRT from DrManning finished 01/26/16;  Follow up CT Chest 02/13/17 reviewed (see above), & DrMohamed felt there was no recurrence or dis progression but +for inflamm changes in RLL & loc pleural effusion; rec to re-scan in 77mo25mo We set him up to see DrBartle 02/25/17> he reiewed prev eval, scan, thoracenteses, etc; he agreed w/ performing a right VATS to drain the resid effusion 7 examine the pleural space for tumor/ infection etc followed by pleurodesis...    He was ADM 7/6 - 03/08/17 by DrBartle w/ right VATS w/ drainage of pleural effusion & Bx of pleural surface + talc pleurodesis=> all pathology was benign (fibrous tissue & chr inflamm; Disch on similar med regimen + [phone] stable since surg- +SOB/DOE w/ stairs etc, reports eating well, no CP & hasn't required pain meds; denies f/c/s, notes min cough & sm amt whitish sput- no color, no blood... Finishing Levaquin, off Pred, on NEB w/ Albut Tid, Symbicort160-2spBid, GFN400-2Tid + fluids...    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 EXAM shows Afeb, VSS, O2sat=99% on RA; Wt=174# (down 5#);  HEENT- neg, mallampati2;  Chest- sl decr BS right base, few rhonchi, w/o w/r/consolidation;  Heart- RR Gr1/6SEM w/o r/g;  Abd- soft, neg;  Ext- neg w/o c/c/e...  2DEcho done 02/26/17 showed mod dil LV & severely reduced LVF w/ EF down to 20-25%, Gr1DD, mod calcif AoV leaflets, MV was OK, RV mod dilated & sys function mildly reduced, no PAsys reported...Marland KitchenMarland Kitchen  IMP/PLAN>>  The worsening LVF w/ EF 28-78% is certainly playing a roll in his unrespolved dyspnea=> refer to CHF clinic for active management; in the interim continue NEBs, Symbicort, Mucinex, etc...   ~  April 24, 2017:  6wk [place] had CHF clinic eval by [facility] on 04/22/17-- note reviewed, Hx CAD w/ CABG in 1996 & PCI in 2015, ischemic cardiomyopathy, and c/o incr SOB & recent 2DEcho (02/2017) showed EF=20-25%;  DrMcLean adjusted his meds by changing Metoprolol to Coreg & adding Entresto, ultimately he would like to add Spironolactone; they discussed R+L heart cath soon (sched 9/14)...      Davonn has had f/u appts w/ PCP-DrLuking on 03/14/17 (transitional care follow up) and TCTS-DrBartle on 03/20/17>  Notes reviewed, his breathing is somewhat improved, on Levaquin for Serratia, DrBartle noted>>  Most of the right lung is firmly adherent to the chest wall from his prior XRT. There is a loculated fluid collection at the apex of the chest that is sealed in by the lung and could not be drained. There was a large collection in the  lower pleural space that was drained and talc pleurodesis performed. Hopefully this space will resolve over time with the talc and not refill with fluid. There is no evidence of malignancy so it is possible that this is related to heart  failure given his EF of 20-25%. He reports that he has started back to walking & slowly building up, even played 2 nine-hole rounds of golf when it wasn't too humid; he has a resid cough but decr sput & easier to produce, he requests a handicap placard... See prob list above... EXAM shows Afeb, VSS, O2sat=96% on RA; Wt=178# (up 4#);  HEENT- neg, mallampati2;  Chest- sl decr BS right base, few rhonchi, w/o w/r/consolidation;  Heart- RR Gr1/6SEM w/o r/g;  Abd- soft, neg;  Ext- neg w/o c/c/e...  CXR 04/24/18 (independently reviewed by me in the PACS system) shows left lung clear, right sided vol loss, small A/F levels at the base, pleural fluid vs thickening at right base, stable soft tissue fullness at right apex, s/p CABG etc. IMP/PLAN>>  Andri appears stable post T-surg & improved w/ CHF-clinic Rx per DrMcLean;  OK 2018 FLU shot today, and continue pulm rx w/ Albut NEBS Tid, Symbicort160-2spBid, GFN400-2Bid;  Call for any questions and we plan f/u visit in 33mo..  ~  June 24, 2017:  261moOV & [place] presents w/ a 2d hx incr cough, yellow sput, & chest congestion- denies f/c/s;  He's been using NEBs Qid (incr per DrLuking), plus his Symbicort160-2spBid & GFN400-2Tid;  He had a ZPak called in recently;  We discussed BacterimDS Bid x10d for  this COPD exac + Pred20- 5d slow tapering protocol (see AVS) w/ rov recheck 6wks...     He saw TCTS-DrBartle 05/22/17>  S/p bronch & right VATS to drain the loculated right effusion & talc pleurodesis 03/01/17;  At surg he noted most of the right lung was firmly adherent to the chest wall from his prior XRT. There was a loculated fluid collection at the apex of the chest that was sealed in by the lung and could not be drained. There was a large collection in the lower pleural space that was drained and talc pleurodesis performed. Unfortunately the right lower lobe was partially collapsed and chronically scarred and would not expand to fill in the space formed after draining the effusion => they signed off...    He saw V[facility]-DrCain 05/24/17>  Hx AAA- s/p endovasc stent graft repair, & hx Carotid art dis being followed;  Abd Ao Duplex was stable w/ 3.3 x 3.4cm aneurysm w/o elev velocities;  CDuplex w/ 40-59% RICA stenosis & 9-24% LICA and they plan recheck 89yr..    He saw CARDS-DrMcLean 05/27/17>  CAD, s/p CABG 1996, subseq PCIs, RBBB, ischemic cardiomyopathy/ CHF w/ 2DEcho showing EF=20-25% in 7/18;  Also has Hx AAA w/ prec stent graft repair;  He had CATH 04/2017 showing occluded SVG-small D, patent LIMA-LAD and SVG-PDA, 50% shelf-like stenosis distal left main (no intervention), filling pressure not elevated and cardiac output preserved.  On Coreg & Entresto, they added spironolactone & f/u 283mo   He saw ONC-DrMohamed 05/28/17>  DIAGNOSIS: Stage IIIA (T2b, N2, M0) non-small cell lung cancer, favoring squamous cell carcinoma presented with right upper lobe lung mass in addition to mediastinal lymphadenopathy diagnosed in March 2017.  PRIOR THERAPY:  A course of concurrent chemoradiation with weekly carboplatin for AUC of 2 and paclitaxel 45 MG/M2. Status post 5 cycle with partial response.  CURRENT THERAPY: Observation.  He had f/u CXR & CT Chest (see below)- no evid of dis progression/ recurrence & f/u planned  70m470mo    He saw PCP-DrLuking 06/06/17>  Annual wellness visit, note reviewed, same meds & they discussed compliance issues... EXAM shows Afeb, VSS, O2sat=98% on RA; Wt=178# (stable);  HEENT- neg, mallampati2;  Chest- sl decr BS right base, few rhonchi, w/o w/r/consolidation;  Heart- RR Gr1/6SEM w/o r/g;  Abd- soft, neg;  Ext- neg w/o c/c/e...  CT Chest 05/21/17>  See report -- IMPRESSION:  1) Similar radiation fibrosis within the paramediastinal right lung.  2) Increase in ill-defined micro nodularity bilaterally. Favor infection, including atypical etiologies.  3) Interval T4 compression deformity, favored to be posttraumatic. No metastasis identified in this region on the prior CT.  4) Aortic Atherosclerosis.  5) Loculated right-sided pleural fluid and inferior right hydropneumothorax, similar.  6) Tiny hiatal hernia. Gastric underdistention with possible gastric wall thickening suggestive of gastritis.  7) Similar borderline subcarinal adenopathy.  8)  Emphysema.  CXR 05/22/17>  Norm heart size w/ prev CABG & ao calcif, stable vol loss on the right & soft tissue fullness in right hilum, w/ consolid right apex, mod right effusion w/ loculated air/fluid IMP/PLAN>>  As above- we discussed treating THIS COPD exac w/ BacterimDS & PRED taper; he is to continue w/ his NEBS, Symbicort, GFN and we plan recheck in 6wks time...    ~  August 08, 2017:  6wk ROV & it is unclear if Paul Sandoval really cleared up after the last OV- he'd been treated w/ ZPak, then BacterimDS w/ Pred taper but returns today stating "not good, I need a little help, getting weaker"; he brought in a sputum sample w/ thick beige sput=> cult grew moraxella cataralis & we discussed treatment this go-round w/ AUGMENTIN + Depo80 & a prolonged slow tper of Medrol8mg370mbs...     He had one interim f/u visit 08/02/17 w/ CARDS-DrMcLean> CAD, s/p CABG 1996, subseq PCIs, RBBB, ischemic cardiomyopathy/ CHF w/ 2DEcho showing EF=20-25% in 7/18;  Note  reviewed- they titrated up his Coreg to 9.375Bid, Entresto to 24/26Bid, & Spironolactone to 25mg370m. We reviewed the following medical problems during today's office visit >>     COPD (Stage3 w/ signif revers component), former smoker (quit 1996 w/ ~40+ pack-yr hx)>  On NEBs w/ Albut Qid, Symbicort160-2spBid regularly, VentolinHFA rescue inhaler & GFN as needed; with persistent symptoms we discussed  changing Albut to DUONEB Qid, Symbicort160-2spBid, incr GFN400 to 2tabsTid + fluids & to work hard expectorating the mucus...    Freq COPD exacerbations & sput C&S growing Serratia 7/18 (resist to cephalosporins), and MCat 12/18 (wasn't better after ZPak & BacterimDS, therefore switched to Augmentin rx)... Now w/ MCat in sput & no better after ZPak & BacterimDS + Pred- we are treating w/ Augmentin x10d & long slow Medrol taper...     Non-small cell lung cancer, Stage IIIA, diagnosed 10/2015 & treated by DrMohamed & DrManning w/ Chemoradiation (carboplatin & paclitaxel)     ~  Bronch 11/17/15 by Kallie Edward showed no endobronchial lesions- TBBx were neg, but brushing & washings were pos for malig cells (felt to be c/w SqCellCa)    ~  He received ChemoRx from DrMohamed with weekly carboplatin and paclitaxel status post 4 cycles finished 01/24/16 (dose held on several occas due to side effects)    ~  He received XRT 4/17 - 01/26/16 by DrManning to the primary tumor & involved mediastinal adenopathy w/ 66 Gy (33 fractions of 2Gy each); he had mod esophagitis & some fatigue, plus post radiation scarring in the lung...    ~  DrMohamed follows w/ serial CT Chest scans every 18mo=> now every 654mo/ f/u due 11/2017...    Abnormal CXR w/ RUL mass, s/p treatment as above, & subseq RUL opac (?infection vs radiation fibrosis changes), right pleural effusion>      ~  Treated w/ antibiotic Rx & PREDNISONE taper (30-25-20-15-10-5 Q5d over 73m673moer DrManning...    ~  HosOpal in 01/2016>  Fever, cough, increased RUL opac (PCT<0.10, he  received 10d Levaquin) & right>left effusion w/ thoracentesis 02/22/16 removing 1200cc clear yellow fluid, prob transudate, & BNP was >900; meds adjusted & he was diuresed w/ improvement but 2DEcho revealed worsening CHF w/ EF ~30-35% + HK & AK => Cards f/u.    ~  [facility]  He had right pleural effusion tapped 02/22/16=> Prob TRANSUDATE w/ TProt<3.0, LDH=95, Cytology=NEG (reactive mesothelial cells only);  Cults=> no growth    ~  Progressive changes in right chest and recurrent symptoms over 2017-2018 w/ interval thoracentesis etc-- all led up to right VATS surg & talc pleurodesis 02/2017 by Dr BarCyndia Bentll path was benign)...    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 pt was diuresed & they plan an outpt ischemic eval due to decr EF & wall motion abn..    ~  He saw CARDS PA 03/01/16> no change in meds, Lexiscan Myoview 03/06/16 showed hi risk study w/ EF=30%, no ST segm changes, c/w antero-apical MI & no evid reversible ischemia...    ~  Referred to DrMcLean's CHF clinic 7/18 w/ change in meds and titration of Coreg, Entresto, Spironolactone => on-going f/u & titration of meds...    ASPVD- s/p AAA stent graft 11/2012, mod bilat carotid occlusive dis (asymptomatic) w/ CDopplers showing ~50-60% bilat ICAstenoses>  [facility]- plans yearly f/u CT Abd and CDopplers    MEDICAL issues>  HBP, HL, GERD/ Divertic/ Polyps, CKD-stage3 (cr=1.6), Anemia (Hg=11.9) & thrombocytopenia (plat=171K)>  On Simva80-1/2Qd, Prilosec20/d, Fe/ MVI etc... EXAM shows Afeb, VSS, O2sat=95% on RA; Wt=183# (stable);  HEENT- neg, mallampati2;  Chest- sl decr BS right base,  few rhonchi, w/o w/r/consolidation;  Heart- RR Gr1/6SEM w/o r/g;  Abd- soft, neg;  Ext- neg w/o c/c/e...  LABS 08/02/17>  Chems- ok w/ Cr=1.43,  K=4.0 IMP/PLAN>>  As noted above Paul Sandoval is having a rough time lately>  He had the right VATS surg & talc pleurodesis 02/2017;  He has a severe ischemic cardiomyopathy w/ EF= 20-25% & now followed closely by DrMcLean w/ freq (Q10mo visits in the CHF clinic for medication titration;  He has severe obstructive lung dis w/ FEV1=1.25 (40%) in 01/2016 and freq exacerbations as documented above;  We reviewed recommendations for NEBS w/ DUONEB Qid, Symbicort160-2spBid, GFN400-2tabs Tid w/ fluids;  We are starting a 10d course of AUGMENTIN875Bid and a long slow MEDROL taper w/ planned recheck in 6wks;  He will continue to work on chest physiotherapy & bronchial mucus hygiene...    Past Medical History:  Diagnosis Date  . AAA (abdominal aortic aneurysm) ([facility] 2010   4.4 cm 08/2008;4.44 in 7/10 and 4.65 in 08/2009; 4.8 by CT in 11/2009; 4.3 by ultrasound in 08/2010  . Anemia   . Arteriosclerotic cardiovascular disease (ASCVD) 1996   CABG-1996  . Arthritis    "fingers" (03/18/2014)  . CAD (coronary artery disease)    03/18/14:  PCI with DES to distal left main. 7/29: DES to the SVG to Diag  . Cancer ([facility]    Upper right lobe lung cancer  . Cardiomyopathy, ischemic    Echo 03/17/14: EF 45-50%  . Chronic bronchitis ([facility]   . Chronic kidney disease    CRF  . Chronic rhinitis   . Colonic polyp 2002   polypectomy in 2002  . COPD (chronic obstructive pulmonary disease) ([facility]   . Diverticulosis   . Dyspnea    with exertion  . ED (erectile dysfunction)   . Encounter for antineoplastic chemotherapy 12/19/2015  . GERD (gastroesophageal reflux disease)   . History of blood transfusion   . Hyperlipidemia   . Hypertension   . IFG (impaired fasting glucose)   . Myocardial infarction ([facility]    "told h/o silent MI sometime before 1996"  . Pneumonia ~ 2001; ~ 2005    has had more than twice  . Right bundle branch block   . Tobacco abuse, in remission    40 pack year total consumption; discontinued in 1996     Past Surgical History:  Procedure Laterality Date  . ABDOMINAL AORTIC ANEURYSM REPAIR  11/2012  . ABDOMINAL AORTIC ENDOVASCULAR STENT GRAFT N/A 12/11/2012   Procedure: ABDOMINAL AORTIC ENDOVASCULAR STENT GRAFT;  Surgeon: JMal Misty MD;  [place]  Service: Vascular;  Laterality: N/A;  Ultrasound guided; [place]  . CARDIAC CATHETERIZATION  01/08/1995  . COLONOSCOPY  2002   polypectomy-patient denies  . CORONARY ANGIOPLASTY WITH STENT PLACEMENT  03/18/2014   "1"  . CORONARY ANGIOPLASTY WITH STENT PLACEMENT  03/24/2014   "1"  . CORONARY ARTERY BYPASS GRAFT  01/09/1995   "CABG X3"  . FLEXIBLE BRONCHOSCOPY N/A 03/01/2017   Procedure: FLEXIBLE BRONCHOSCOPY WITH BIOPSIES;  Surgeon: BGaye Pollack MD;  Location: [facility];  Service: Thoracic;  Laterality: N/A;  . JOINT REPLACEMENT    . LAPAROSCOPIC CHOLECYSTECTOMY  12/2009  . LEFT AND RIGHT HEART CATHETERIZATION WITH CORONARY/GRAFT ANGIOGRAM N/A 03/18/2014   Procedure: LEFT AND RIGHT HEART CATHETERIZATION WITH CBeatrix Fetters  Surgeon: MBlane Ohara MD;  [facility];  Service: Cardiovascular;  Laterality: N/A;  . PERCUTANEOUS CORONARY STENT INTERVENTION (PCI-S)  03/18/2014   Procedure: PERCUTANEOUS  CORONARY STENT INTERVENTION (PCI-S);  Surgeon: Blane Ohara, MD;  Location: [facility] CATH LAB;  Service: Cardiovascular;;  . PERCUTANEOUS CORONARY STENT INTERVENTION (PCI-S) N/A 03/24/2014   Procedure: PERCUTANEOUS CORONARY STENT INTERVENTION (PCI-S);  Surgeon: Blane Ohara, MD;  Location: [facility] CATH LAB;  Service: Cardiovascular;  Laterality: N/A;  . PLEURAL EFFUSION DRAINAGE Right 03/01/2017   Procedure: DRAINAGE OF PLEURAL EFFUSION;  Surgeon: Gaye Pollack, MD;  Location: [place];  Service: Thoracic;  Laterality: Right;  . RIGHT/LEFT HEART CATH AND CORONARY/GRAFT ANGIOGRAPHY N/A 05/15/2017   Procedure: RIGHT/LEFT HEART CATH AND CORONARY/GRAFT ANGIOGRAPHY;  Surgeon: Larey Dresser, MD;  Location: [place] CV LAB;  Service:  Cardiovascular;  Laterality: N/A;  . TALC PLEURODESIS Right 03/01/2017   Procedure: Pietro Cassis;  Surgeon: Gaye Pollack, MD;  Location: [place];  Service: Thoracic;  Laterality: Right;  . TOTAL HIP ARTHROPLASTY Left 01/21/2013   Procedure: TOTAL HIP ARTHROPLASTY ANTERIOR APPROACH;  Surgeon: Mauri Pole, MD;  Location: [place];  Service: Orthopedics;  Laterality: Left;  Marland Kitchen VIDEO ASSISTED THORACOSCOPY Right 03/01/2017   Procedure: VIDEO ASSISTED THORACOSCOPY WITH BIOPSIES;  Surgeon: Gaye Pollack, MD;  Location: [place];  Service: Thoracic;  Laterality: Right;  Marland Kitchen VIDEO BRONCHOSCOPY N/A 11/17/2015   Procedure: VIDEO BRONCHOSCOPY WITH FLUORO;  Surgeon: Rigoberto Noel, MD;  Location: [place];  Service: Cardiopulmonary;  Laterality: N/A;    Outpatient Encounter Medications as of 08/08/2017  Medication Sig  . acetaminophen (TYLENOL) 500 MG tablet Take 500 mg by mouth every 6 (six) hours as needed for mild pain.  Marland Kitchen albuterol (PROVENTIL) (2.5 MG/3ML) 0.083% nebulizer solution Take 3 mLs (2.5 mg total) by nebulization 4 (four) times daily.  Marland Kitchen aspirin EC 81 MG tablet Take 81 mg by mouth daily before breakfast.   . budesonide-formoterol (SYMBICORT) 160-4.5 MCG/ACT inhaler Inhale 2 puffs into the lungs 2 (two) times daily.  . carvedilol (COREG) 6.25 MG tablet Take 1.5 tablets (9.375 mg total) by mouth 2 (two) times daily with a meal.  . clopidogrel (PLAVIX) 75 MG tablet TAKE ONE TABLET BY MOUTH DAILY.  Marland Kitchen Ferrous Sulfate (IRON) 28 MG TABS Take 28 mg by mouth daily.  . furosemide (LASIX) 40 MG tablet Take 40 mg by mouth daily before breakfast.  . GuaiFENesin (MUCUS RELIEF ADULT PO) Take 2 capsules by mouth 3 (three) times daily.  . Multiple Vitamins-Minerals (CENTRUM SILVER ADULT 50+) TABS Take 1 tablet by mouth daily with lunch.   Marland Kitchen NITROSTAT 0.4 MG SL tablet PLACE 1 TAB UNDER TONGUE EVERY 5 MIN IF NEEDED FOR CHEST PAIN. MAY USE 3 TIMES.NO RELIEF CALL 911.  . omeprazole (PRILOSEC) 20 MG capsule TAKE  ONE CAPSULE BY MOUTH DAILY.  . Probiotic Product (FLORAJEN3 PO) Take 1 tablet by mouth daily.  . sacubitril-valsartan (ENTRESTO) 24-26 MG Take 1 tablet by mouth 2 (two) times daily.  . simvastatin (ZOCOR) 80 MG tablet Take 0.5 tablets (40 mg total) by mouth at bedtime.  Marland Kitchen spironolactone (ALDACTONE) 25 MG tablet Take 1 tablet (25 mg total) by mouth daily.  . VENTOLIN HFA 108 (90 Base) MCG/ACT inhaler INHALE 2 PUFFS BY MOUTH EVERY 4 TO 6 HOURS AS NEEDED FOR WHEEZING.  . [DISCONTINUED] sulfamethoxazole-trimethoprim (BACTRIM DS,SEPTRA DS) 800-160 MG tablet Take 1 tablet by mouth 2 (two) times daily.  Marland Kitchen amoxicillin-clavulanate (AUGMENTIN) 875-125 MG tablet Take 1 tablet by mouth 2 (two) times daily.  Marland Kitchen ipratropium-albuterol (DUONEB) 0.5-2.5 (3) MG/3ML SOLN Take 3 mLs by nebulization 4 (four) times daily.  Marland Kitchen  methylPREDNISolone (MEDROL) 8 MG tablet Take 1 tablet (8 mg total) by mouth daily. Start w/ one tab twice daily for 1 week...    Then decrease to one tab each AM for 1 week...    Then decrease to 1/2 tab daily in the AM for 1 week...    Then decrease to 1/2 tab every other day til return visit (1/2, 0, 1/2, 0, etc)...  . [DISCONTINUED] methylPREDNISolone (MEDROL) 8 MG tablet Take 1 tablet (8 mg total) by mouth daily.  . [EXPIRED] methylPREDNISolone acetate (DEPO-MEDROL) injection 80 mg    No facility-administered encounter medications on file as of 08/08/2017.     Allergies  Allergen Reactions  . Neomycin Hives    Immunization History  Administered Date(s) Administered  . H1N1 08/06/2008  . Influenza Split 06/24/2013  . Influenza,inj,Quad PF,6+ Mos 06/15/2014, 05/31/2015, 05/09/2016, 06/06/2017  . Influenza-Unspecified 05/27/2012  . Pneumococcal Conjugate-13 08/18/2014  . Pneumococcal Polysaccharide-23 03/27/2012  . Zoster 07/27/2008    Current Medications, Allergies, Past Medical History, Past Surgical History, Family History, and Social History were reviewed in [employer] record.   Review of Systems             All symptoms NEG except where BOLDED >>  Constitutional:  F/C/S, fatigue, anorexia, unexpected weight change. HEENT:  HA, visual changes, hearing loss, earache, nasal symptoms, sore throat, mouth sores, hoarseness. Resp:  cough, sputum, hemoptysis; SOB, tightness, wheezing. Cardio:  CP, palpit, DOE, orthopnea, edema. GI:  N/V/D/C, blood in stool; reflux, abd pain, distention, gas. GU:  dysuria, freq, urgency, hematuria, flank pain, voiding difficulty. MS:  joint pain, swelling, tenderness, decr ROM; neck pain, back pain, etc. Neuro:  HA, tremors, seizures, dizziness, syncope, weakness, numbness, gait abn. Skin:  suspicious lesions or skin rash. Heme:  adenopathy, bruising, bleeding. Psyche:  confusion, agitation, sleep disturbance, hallucinations, anxiety, depression suicidal.   Objective:   Physical Exam       Vital Signs:  Reviewed...   General:  WD, WN, 76 y/o WM in NAD; alert & oriented; pleasant & cooperative... HEENT:  [place]/AT; Conjunctiva- pink, Sclera- nonicteric, EOM-wnl, PERRLA, EACs-clear, TMs-wnl; NOSE-clear; THROAT-clear & wnl.  Neck:  Supple w/ fair ROM; no JVD; normal carotid impulses w/ faint bruits; no thyromegaly or nodules palpated; no lymphadenopathy.  Chest:  decr BS right base & clear w/o w/r/r & no signs of consolid. Heart:  Regular Rhythm; norm S1 & S2 w/ Gr1/6 SEM without rubs or gallops detected. Abdomen:  Soft & nontender- no guarding or rebound; normal bowel sounds; no organomegaly or masses palpated. Ext:  Sl decr ROM; without deformities +arthritic changes; no varicose veins, +venous insuffic, tr edema;  Pulses decr w/o bruits. Neuro:  CNs II-XII intact; motor testing normal; sensory testing normal; gait normal & balance OK. Derm:  No lesions noted; no rash etc. Lymph:  No cervical, supraclavicular, axillary, or inguinal adenopathy palpated.   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  PLAN>>  02/29/16>   Problem list as above w/ signif cardiac, pulmonary, & post chemoradiation changes;  From the pulm standpoint- he feels the BREO is helping & hasn't needed the rescue inhaler very often, continue same;  He is on a long PRED taper per DrManning for poss radiation fibrosis RUL, Sed rate is 53, continue this taper & careful w/ sweets/ sugar etc;  From the cardiac standpoint- he has signif underlying dis w/ Lmain stent, s/p CABG 20 yrs ago, prob ischemic cardiomyopathy w/ worsening 2DEcho recently w/ 30-35% EF + HK&AK seen, BNP=1053, Cards plans ischemic work up when able, in the meanwhile he has improved w/ diuresis but Chems w/ mild RI & HCO3=36... REC- add DIAMOX-ER 500 one tab daily at 4pm, continue his Lasix40 Qam, increase water intake, watch weights... We plan ROV 70monthrecheck... 04/04/16>   Discussed w/ Jahni-- continue the Levaquin til gone, use Tylenol for fever or pain; continue the Breo daily & Ventolin-HFA as needed; wean off the Diamox at  this time, continue Lasix40, no salt, etc; he needs to gradually increase his exercise/ mobility; we will plan ROV w/ labs in 259mo 06/06/16>   He is feeling better, performance status improving, CXR shows progressive changes in RUL s/p chemoradiation; CT=>PET scans w/ vol loss & persistent hypermetabolism/ evolving changes in RUL & right hilum plus he has a mod large right effusion; prev thoracentesis 01/2016 in hosp was transudative- I believe he would benefit from further eval/ repeat thoracentesis to recheck this fluid; we will set this up via IR & ask them to drain the fluid as much as poss, send it for Cytology, cell ct & diff, TProt/ LDH/ Gluc => 1.2L removed, prob exudate, NEG cytology, & we will follow... 08/06/16>   BiCordaryls clinically stable, no clear evid of recurrent dis & followed very closely by DrMohamed; Pulm stable on Symbicort Bid, Albut rescue prn, and his exercise- continue same 7 we plan rov recheck in 41m45mo12/18>    BilTreavon doing satis on Symbicort160-2spBid & Albut rescue inhaler prn; he states that he is active, energy is good, & that he's getting stronger & better (good performance status); I suspect that he has residual cancer in the right chest but it appears slowly progressive (w/ sl incr right effusion & subcarinal LN) and DrMohamed to review status & decide regarding additional therapy at this point vs continued observation...  01/02/17>   BilLennon improved but it's been a slow process; he is at high risk for cancer progression & followed closely by DrMohamed who plans f/u CT & recheck in June;  Continue current meds, add Guaifenesin 2400m71m+ water to aid in mucus production... We will recheck in 16mo 21moview his f/u scans planned by Oncology. 02/04/17>   SEE ABOVE - Rx Pred taper & Levaquin=> no better; R-Thoracentesis by IR w/ only 280cc exudative loculated fluid removed, cytology- NEG; Limited options at this point- Rx w/ Augmentin empirically, add NEBs w/ Albuterol Tid,  continue GFN 2400mg/32mWe will check 2DEcho and refer to TCTS  to consider further Rx options for right effusion... 03/13/17>   The worsening LVF w/ EF 58-09% is certainly playing a roll in his unrespolved dyspnea=> refer to CHF clinic for active management; in the interim continue NEBs, Symbicort, Mucinex, etc.. 04/24/17>   Paul Sandoval appears stable post T-surg & improved w/ CHF-clinic Rx per DrMcLean;  OK 2018 FLU shot today, and continue pulm rx w/ Albut NEBS Tid, Symbicort160-2spBid, GFN400-2Bid;  Call for any questions and we plan f/u visit in 22mo 06/24/17>   As above- we discussed treating THIS COPD exac w/ BacterimDS & PRED taper; he is to continue w/ his NEBS, Symbicort, GFN and we plan recheck in 6wks time 08/08/17>   As noted above BRush Landmarkis having a rough time lately>  He had the right VATS surg & talc pleurodesis 02/2017;  He has a severe ischemic cardiomyopathy w/ EF= 20-25% & now followed closely by DrMcLean w/ freq (Q227movisits in the CHF clinic for medication titration;  He has severe obstructive lung dis w/ FEV1=1.25 (40%) in 01/2016 and freq exacerbations as documented above;  We reviewed recommendations for NEBS w/ DUONEB Qid, Symbicort160-2spBid, GFN400-2tabs Tid w/ fluids;  We are starting a 10d course of AUGMENTIN875Bid and a long slow MEDROL taper w/ planned recheck in 6wks;  He will continue to work on chest physiotherapy & bronchial mucus hygiene   Plan:       Medication List        Accurate as of 08/08/17 11:59 PM. Always use your most recent med list.          acetaminophen 500 MG tablet Commonly known as:  TYLENOL   amoxicillin-clavulanate 875-125 MG tablet Commonly known as:  AUGMENTIN Take 1 tablet by mouth 2 (two) times daily.   aspirin EC 81 MG tablet   budesonide-formoterol 160-4.5 MCG/ACT inhaler Commonly known as:  SYMBICORT   carvedilol 6.25 MG tablet Commonly known as:  COREG Take 1.5 tablets (9.375 mg total) by mouth 2 (two) times daily with a meal.    CENTRUM SILVER ADULT 50+ Tabs   clopidogrel 75 MG tablet Commonly known as:  PLAVIX TAKE ONE TABLET BY MOUTH DAILY.   FLORAJEN3 PO   furosemide 40 MG tablet Commonly known as:  LASIX   ipratropium-albuterol 0.5-2.5 (3) MG/3ML Soln Commonly known as:  DUONEB Take 3 mLs by nebulization 4 (four) times daily.   Iron 28 MG Tabs   methylPREDNISolone 8 MG tablets Commonly known as:  MEDROL    Start w/ one tab twice daily for 1 week...    Then decrease to one tab each AM for 1 week...    Then decrease to 1/2 tab daily in the AM for 1 week...    Then decrease to 1/2 tab every other day til return visit (1/2, 0, 1/2, 0, etc)...   MUCUS RELIEF ADULT PO   NITROSTAT 0.4 MG SL tablet Generic drug:  nitroGLYCERIN PLACE 1 TAB UNDER TONGUE EVERY 5 MIN IF NEEDED FOR CHEST PAIN. MAY USE 3 TIMES.NO RELIEF CALL 911.   omeprazole 20 MG capsule Commonly known as:  PRILOSEC TAKE ONE CAPSULE BY MOUTH DAILY.   sacubitril-valsartan 24-26 MG Commonly known as:  ENTRESTO Take 1 tablet by mouth 2 (two) times daily.   simvastatin 80 MG tablet Commonly known as:  ZOCOR Take 0.5 tablets (40 mg total) by mouth at bedtime.   spironolactone 25 MG tablet Commonly known as:  ALDACTONE Take 1 tablet (25 mg total) by mouth daily.   * VENTOLIN  HFA 108 (90 Base) MCG/ACT inhaler Generic drug:  albuterol INHALE 2 PUFFS BY MOUTH EVERY 4 TO 6 HOURS AS NEEDED FOR WHEEZING.      * This list has 2 medication(s) that are the same as other medications prescribed for you. Read the directions carefully, and ask your doctor or other care provider to review them with you.        Where to Get Your Medications    These medications were sent to [facility], [place], [place] [address]   Phone:  [phone]   amoxicillin-clavulanate 875-125 MG tablet  ipratropium-albuterol 0.5-2.5 (3) MG/3ML Soln   You can get these medications from any pharmacy   Bring a  paper prescription for each of these medications  methylPREDNISolone 8 MG tablet

## 2017-08-12 ENCOUNTER — Ambulatory Visit (HOSPITAL_COMMUNITY)
Admission: RE | Admit: 2017-08-12 | Discharge: 2017-08-12 | Disposition: A | Discharge status: Home / Self Care | Payer: Medicare Other | Source: Ambulatory Visit | Attending: Internal Medicine | Admitting: Internal Medicine

## 2017-08-12 DIAGNOSIS — I5042 Chronic combined systolic (congestive) and diastolic (congestive) heart failure: Secondary | ICD-10-CM | POA: Diagnosis not present

## 2017-08-12 LAB — BASIC METABOLIC PANEL
Anion gap: 8 (ref 5–15)
BUN: 17 mg/dL (ref 6–20)
CHLORIDE: 92 mmol/L — AB (ref 101–111)
CO2: 30 mmol/L (ref 22–32)
Calcium: 9.1 mg/dL (ref 8.9–10.3)
Creatinine, Ser: 1.37 mg/dL — ABNORMAL HIGH (ref 0.61–1.24)
GFR calc non Af Amer: 48 mL/min — ABNORMAL LOW (ref >60)
GFR, EST AFRICAN AMERICAN: 56 mL/min — AB (ref >60)
Glucose, Bld: 100 mg/dL — ABNORMAL HIGH (ref 65–99)
POTASSIUM: 4.2 mmol/L (ref 3.5–5.1)
SODIUM: 130 mmol/L — AB (ref 135–145)

## 2017-08-17 ENCOUNTER — Other Ambulatory Visit (HOSPITAL_COMMUNITY): Payer: Self-pay | Admitting: Cardiology

## 2017-08-17 ENCOUNTER — Other Ambulatory Visit: Payer: Self-pay | Admitting: Family Medicine

## 2017-08-17 DIAGNOSIS — J449 Chronic obstructive pulmonary disease, unspecified: Secondary | ICD-10-CM | POA: Diagnosis not present

## 2017-09-03 DIAGNOSIS — J449 Chronic obstructive pulmonary disease, unspecified: Secondary | ICD-10-CM | POA: Diagnosis not present

## 2017-09-10 ENCOUNTER — Ambulatory Visit: Payer: Medicare Other | Admitting: Family Medicine

## 2017-09-10 ENCOUNTER — Encounter: Payer: Self-pay | Admitting: Family Medicine

## 2017-09-10 VITALS — BP 120/78 | Temp 98.7°F | Ht 70.0 in | Wt 183.0 lb

## 2017-09-10 DIAGNOSIS — J449 Chronic obstructive pulmonary disease, unspecified: Secondary | ICD-10-CM | POA: Diagnosis not present

## 2017-09-10 DIAGNOSIS — J441 Chronic obstructive pulmonary disease with (acute) exacerbation: Secondary | ICD-10-CM | POA: Diagnosis not present

## 2017-09-10 MED ORDER — CEFDINIR 300 MG PO CAPS: 300.0000 mg | ORAL_CAPSULE | Freq: Two times a day (BID) | ORAL | 0 refills | Status: DC

## 2017-09-10 NOTE — Progress Notes (Signed)
   Subjective:    Patient ID: Paul Sandoval, male    DOB: 1940/12/30, 77 y.o.   MRN: [id number]  HPI  Patient is here today with a productive cough and congestion that is greenish yellow.Taking tylenol     Has had freq cough and cong and flares of soputum prodcution     Flaring up the wheezing, cough is terrible   Patient facing numerous challenges.  Status post lung carcinoma treatment.  COPD with element of reactive airways.  Recurrent pneumonia.  Recurrent bronchitis.  Once again experiencing another flare.  Pulmonary notes reviewed.  Received round of Augmentin last month.  No fever no chills.  Wheezing has not been as bad as in the past.  Ongoing chronic shortness of breath  Review of Systems No headache, no major weight loss or weight gain, no chest pain no back pain abdominal pain no change in bowel habits complete ROS otherwise negative     Objective:   Physical Exam  Alert active good hydration no baseline tachypnea no inspiratory crackles.  Positive rhonchi.  No wheeze except with cough.  Occasional cough during exam.  Heart regular rate and      Assessment & Plan:  Impression exacerbation of COPD.  Doubt pneumonia.  Omnicef twice daily 10 days.  Warning signs discussed.  Needs to stay in ongoing close contact with his pulmonary specialist with his complexity due to see soon

## 2017-09-12 ENCOUNTER — Other Ambulatory Visit (HOSPITAL_COMMUNITY): Payer: Self-pay

## 2017-09-12 MED ORDER — SACUBITRIL-VALSARTAN 24-26 MG PO TABS: 1.0000 | ORAL_TABLET | Freq: Two times a day (BID) | ORAL | 3 refills | Status: DC

## 2017-09-16 ENCOUNTER — Other Ambulatory Visit: Payer: Self-pay | Admitting: Family Medicine

## 2017-09-17 DIAGNOSIS — J449 Chronic obstructive pulmonary disease, unspecified: Secondary | ICD-10-CM | POA: Diagnosis not present

## 2017-09-19 ENCOUNTER — Encounter: Payer: Self-pay | Admitting: Pulmonary Disease

## 2017-09-19 ENCOUNTER — Ambulatory Visit: Payer: Medicare Other | Admitting: Pulmonary Disease

## 2017-09-19 ENCOUNTER — Other Ambulatory Visit (INDEPENDENT_AMBULATORY_CARE_PROVIDER_SITE_OTHER): Payer: Medicare Other

## 2017-09-19 VITALS — BP 122/68 | HR 86 | Temp 97.7°F | Ht 70.0 in | Wt 179.8 lb

## 2017-09-19 DIAGNOSIS — E785 Hyperlipidemia, unspecified: Secondary | ICD-10-CM

## 2017-09-19 DIAGNOSIS — I714 Abdominal aortic aneurysm, without rupture, unspecified: Secondary | ICD-10-CM

## 2017-09-19 DIAGNOSIS — I2510 Atherosclerotic heart disease of native coronary artery without angina pectoris: Secondary | ICD-10-CM | POA: Diagnosis not present

## 2017-09-19 DIAGNOSIS — J449 Chronic obstructive pulmonary disease, unspecified: Secondary | ICD-10-CM | POA: Diagnosis not present

## 2017-09-19 DIAGNOSIS — N183 Chronic kidney disease, stage 3 unspecified: Secondary | ICD-10-CM

## 2017-09-19 DIAGNOSIS — I10 Essential (primary) hypertension: Secondary | ICD-10-CM

## 2017-09-19 DIAGNOSIS — I5042 Chronic combined systolic (congestive) and diastolic (congestive) heart failure: Secondary | ICD-10-CM

## 2017-09-19 DIAGNOSIS — C3411 Malignant neoplasm of upper lobe, right bronchus or lung: Secondary | ICD-10-CM

## 2017-09-19 DIAGNOSIS — Z951 Presence of aortocoronary bypass graft: Secondary | ICD-10-CM | POA: Diagnosis not present

## 2017-09-19 DIAGNOSIS — Z95828 Presence of other vascular implants and grafts: Secondary | ICD-10-CM | POA: Diagnosis not present

## 2017-09-19 DIAGNOSIS — J90 Pleural effusion, not elsewhere classified: Secondary | ICD-10-CM | POA: Diagnosis not present

## 2017-09-19 DIAGNOSIS — I679 Cerebrovascular disease, unspecified: Secondary | ICD-10-CM | POA: Diagnosis not present

## 2017-09-19 DIAGNOSIS — I255 Ischemic cardiomyopathy: Secondary | ICD-10-CM

## 2017-09-19 LAB — CBC WITH DIFFERENTIAL/PLATELET
BASOS ABS: 0 10*3/uL (ref 0.0–0.1)
Basophils Relative: 0.2 % (ref 0.0–3.0)
Eosinophils Absolute: 0 10*3/uL (ref 0.0–0.7)
Eosinophils Relative: 0.3 % (ref 0.0–5.0)
HEMATOCRIT: 37.3 % — AB (ref 39.0–52.0)
Hemoglobin: 12.3 g/dL — ABNORMAL LOW (ref 13.0–17.0)
LYMPHS PCT: 9.4 % — AB (ref 12.0–46.0)
Lymphs Abs: 0.9 10*3/uL (ref 0.7–4.0)
MCHC: 33.1 g/dL (ref 30.0–36.0)
MCV: 88.9 fl (ref 78.0–100.0)
Monocytes Absolute: 0.8 10*3/uL (ref 0.1–1.0)
Monocytes Relative: 8.8 % (ref 3.0–12.0)
NEUTROS ABS: 7.7 10*3/uL (ref 1.4–7.7)
Neutrophils Relative %: 81.3 % — ABNORMAL HIGH (ref 43.0–77.0)
PLATELETS: 223 10*3/uL (ref 150.0–400.0)
RBC: 4.2 Mil/uL — AB (ref 4.22–5.81)
RDW: 17 % — ABNORMAL HIGH (ref 11.5–15.5)
WBC: 9.4 10*3/uL (ref 4.0–10.5)

## 2017-09-19 LAB — LIPID PANEL
Cholesterol: 151 mg/dL (ref 0–200)
HDL: 49.2 mg/dL (ref >39.00)
LDL CALC: 80 mg/dL (ref 0–99)
NonHDL: 102
TRIGLYCERIDES: 108 mg/dL (ref 0.0–149.0)
Total CHOL/HDL Ratio: 3
VLDL: 21.6 mg/dL (ref 0.0–40.0)

## 2017-09-19 LAB — COMPREHENSIVE METABOLIC PANEL
ALT: 13 U/L (ref 0–53)
AST: 16 U/L (ref 0–37)
Albumin: 4.2 g/dL (ref 3.5–5.2)
Alkaline Phosphatase: 62 U/L (ref 39–117)
BILIRUBIN TOTAL: 0.5 mg/dL (ref 0.2–1.2)
BUN: 26 mg/dL — ABNORMAL HIGH (ref 6–23)
CALCIUM: 9.4 mg/dL (ref 8.4–10.5)
CHLORIDE: 96 meq/L (ref 96–112)
CO2: 30 mEq/L (ref 19–32)
CREATININE: 1.54 mg/dL — AB (ref 0.40–1.50)
GFR: 46.69 mL/min — ABNORMAL LOW (ref >60.00)
Glucose, Bld: 99 mg/dL (ref 70–99)
Potassium: 4.3 mEq/L (ref 3.5–5.1)
Sodium: 133 mEq/L — ABNORMAL LOW (ref 135–145)
Total Protein: 7.7 g/dL (ref 6.0–8.3)

## 2017-09-19 LAB — BRAIN NATRIURETIC PEPTIDE: Pro B Natriuretic peptide (BNP): 413 pg/mL — ABNORMAL HIGH (ref 0.0–100.0)

## 2017-09-19 LAB — SEDIMENTATION RATE: Sed Rate: 51 mm/hr — ABNORMAL HIGH (ref 0–20)

## 2017-09-19 LAB — TSH: TSH: 2.66 u[IU]/mL (ref 0.35–4.50)

## 2017-09-19 NOTE — Progress Notes (Signed)
Subjective:     Patient ID: Paul Sandoval, male   DOB: 1941/01/22, 77 y.o.   MRN: [id number]  HPI  ~  February 29, 2016:  [name] w/ SN>  Paul Sandoval is a 77 y/o WF w/ mult medical problems as noted below who has seen DrAlva & DrMannam previously;  I have reviewed his extensive epic records and recent [place] x2 in June2017 and formulated the following PROBLEM LIST>>  His PCP is DrLuking in [place]... He reminded me that I cared for his dad in 61...    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    ~  HParralx2 in 01/2016>  Fever, cough, increased RUL opac (PCT<0.10, he received 10d Levaquin) & right>left effusion w/ thoracentesis 02/22/16 removing 1200cc clear yellow fluid, prob transudate, & BNP was >900; meds adjusted & he was diuresed w/ improvement but 2DEcho revealed worsening CHF w/ EF ~30-35% + HK & AK => he has f/u pending w/ Cards...    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    ASPVD- s/p AAA stent graft 11/2012, mod bilat carotid occlusive dis (asymptomatic) w/ CDopplers showing ~50-60% bilat ICAstenoses>  [facility] plans yearly f/u CT Abd and CDopplers    MEDICAL issues>  HBP, HL, GERD/ Divertic/ Polyps, CKD-stage3, Anemia & thrombocytopenia>  On Simva80-1/2Qd, Prilosec20/d, Fe/ MVI/ etc... Since he was disch 02/25/16>  He reports feeling better, breathing better, still sl SOB w/ activ but not requiring O2, mild cough w/ beige sput, no hemoptysis, denies CP=> he is due for f/u CXR & blood work post hosp... EXAM shows Afeb, VSS, O2sat=95% on RA after walking back; Wt=180#;  HEENT- neg, mallampati2;  Chest- sl decr BS right base, few rhonchi, w/o w/r/consolidation;  Heart- RR Gr1/6SEM w/o r/g;  Abd- soft, neg;  Ext- neg w/o c/c/e...  PFT 02/01/16>  FVC=1.96 (45%), FEV1=1.25 (40%), %1sec=64, mid-flows were reduced at 28% predicted; post bronchodil there was a 31% improvement in FEV1 to 1.64;  TLC=5.96 (83%, RV=3.89 (  149%), RV/TLC=65%;  DLCO=58% pred.  This is c/w moderate to severe airflow obstruction, GOLD Stage 3 COPD w/ a signif asthmatic component, air trapping, and decr diffusion capacity...  2DEcho 02/22/16>  Mild conc LVH w/ decr LVF & EF=30-35% w/ diffuse HK & AK in several walls (see report), Gr2 DD, mild MR otherw norm valves, mild LAdil (42m), norm RV function & PA pressures  LABS 01/2016 Hosp>  Chems- ok x Cr=1.3-1.4, BS=120-140, Alb=2.8;  CBC- anemic w/ Hg=8-9 range, Plat~130K range;  TSH=0.92  CT Chest 02/21/16>  Norm heart size, extensive coronary calcif, s/p CABG, decr size of the pathological  right paratracheal LN, decr size of RUL mass (now ~2cm & prev ~4cm), new confluent airsp opac w/ air bronchograms in RUL w/ large right effusion; s/p GB, +HH, Abd Ao stent graft, DJD & DISH w/o metastatic lesions  CT Angio Chest 02/24/16>  NEG for PE, s/pCABG & stent in L[place], norm heart size, no pericard fluid, small right pleural effusion, architectural distortion & interstitial opacities in RUL c/w XRT changes, RUL nodule measures ~2cm, no adenopathy reported...   CXR 02/29/16>  Stable heart size, tortuous Ao, s/p CABG, sl improvement in interstitial opac in RUL area & posteromedial RUL nodule  LABS 02/29/16>  Chems- ok x HCO3=36, BS=120, BUN/Cr=30/1.40;  CBC- Hg=10.9, Plat=249K, WBC=18.2 (on Pred);  BNP=1053;  Sed=53 IMP/PLAN>>  Problem list as above w/ signif cardiac, pulmonary, & post chemoradiation changes;  From the pulm standpoint- he feels the BREO is helping & hasn't needed the rescue inhaler very often, continue same;  He is on a long PRED taper per DrManning for poss radiation fibrosis RUL, Sed rate is 53, continue this taper & careful w/ sweets/ sugar etc;  From the cardiac standpoint- he has signif underlying dis w/ Lmain stent, s/p CABG 20 yrs ago, prob ischemic cardiomyopathy w/ worsening 2DEcho recently w/ 30-35% EF + HK&AK seen, BNP=1053, Cards plans ischemic work up when able, in the meanwhile he has improved w/ diuresis but Chems w/ mild RI & HCO3=36... REC- add DIAMOX-ER 500 one tab daily at 4pm, continue his Lasix40 Qam, increase water intake, watch weights... We plan ROV [id number]...  ~  April 04, 2016:  57m47moV w/ SN>> BilJermariolled the Oncology symptom management clinic 8/4 w/ intermittent cough, wheezing, fever; they did a f/u CXR interpreted as showing a right base opac suspicious for acute pneumonia but this is a soft finding on my review of the XRay, and the prev RUL reticulonod opac is improved (radiation fibrosis improved w/ Pred taper); placed on Levaquin Rx;  He denies sput  production, hemoptysis, CP, or SOB but he is fairly sedentary.    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    ~  [facility]>  Fever, cough, increased RUL opac (PCT<0.10, he received 10d Levaquin) & right>left effusion w/ thoracentesis 02/22/16 removing 1200cc clear yellow fluid, prob transudate, & BNP was >900; meds adjusted & he was diuresed w/ improvement but 2DEcho revealed worsening CHF w/ EF ~30-35% + HK & AK => he has f/u pending w/ Cards...    ~  [facility]  He had right pleural effusion tapped 02/22/16=> Prob TRANSUDATE w/ TProt<3.0, LDH=95, Cytology=NEG (reactive mesothelial cells only);  Cults=> no growth    ~  03/2016 developed cough/ wheezing/ low grade fever=> CXR w/ improved RUL, ?incr markings R base, he was off the Pred, given more Levaquin...     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    ~  He saw CARDS PA 03/01/16> no change in meds, Lexiscan Myoview 03/06/16 showed hi risk study w/ EF=30%, no ST segm changes, c/w antero-apical MI & no evid reversible ischemia...    ASPVD- s/p AAA stent graft 11/2012, mod bilat carotid occlusive dis (asymptomatic) w/ CDopplers showing ~50-60% bilat ICAstenoses>  [facility] plans yearly f/u CT Abd and CDopplers    MEDICAL issues>  HBP, HL, GERD/ Divertic/ Polyps, CKD-stage3, Anemia & thrombocytopenia>  On Simva80-1/2Qd, Prilosec20/d, Fe/ MVI/ etc... EXAM shows Afeb, VSS, O2sat=99% on RA; Wt=178#;  HEENT- neg, mallampati2;  Chest- sl decr BS right base, few rhonchi, w/o w/r/consolidation;  Heart- RR Gr1/6SEM w/o r/g;  Abd- soft, neg;  Ext- neg w/o c/c/e...  Lexiscan Myoview 03/06/16 showed hi risk study w/ EF=30%, no ST segm changes, c/w antero-apical MI & no evid reversible ischemia...  CXR 03/30/16>  incr patchy opac at the right base, no effusion, RUL reticulonodular opac in RUL has regressed; norm heart size s/p CABG, abd Ao endograft part vis  LABS 03/28/16>  Chems- ok w/ K=4.0, HCO3=23, Cr=1.6, BS=145;  CBC- ok w/ Hg=11.4, wbc=6.1, plat=85K;   IMP/PLAN>>  Discussed w/ Paul Sandoval-- continue the Levaquin til gone, use Tylenol for fever or pain; continue the Breo daily & Ventolin-HFA as needed; wean off the Diamox at this time, continue Lasix40, no salt, etc; he needs to gradually increase his exercise/ mobility; we will plan ROV w/ labs in 230mo.  ~  June 06, 2016:  53m153moV w/ SN>  BilDaaielports that he is feeling better, gaining strength, walking 53mi49m mowing yard, and resting well at night;  He notes min hacking cough, no sput- no discoloration or blood, SOB is diminished & he denies CP, no f/c/s... He saw DrMohamed 05/16/16- pt remains on observation (s/p chemoradiation, 5 cycles w/ partial  response); repeat CT Chest 05/10/16 showed more confluent airsp dis in RUL w/ vol loss, abn soft tissue attenuation in right hilum has progressed (?felt to be treatment related), stable subcarinal LN ~11mm19mderate right effusion... DrMohamed felt this scan showed no concerning features of dis progression & they opted for continued observ  w/ f/u 50mo.. 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    ~  [place] in 01/2016>  Fever, cough, increased RUL opac (PCT<0.10, he received 10d Levaquin) & right>left effusion w/ thoracentesis 02/22/16 removing 1200cc clear yellow fluid, prob transudate, & BNP was >900; meds adjusted & he was diuresed w/ improvement but 2DEcho revealed worsening CHF w/ EF ~30-35% + HK & AK => he has f/u pending w/ Cards...    ~  [facility]  He had right pleural effusion tapped 02/22/16=> Prob TRANSUDATE w/ TProt<3.0, LDH=95, Cytology=NEG (reactive mesothelial cells only);  Cults=> no growth    ~  03/2016 developed cough/ wheezing/ low grade fever=> CXR w/ improved RUL, ?incr markings R base,  he was off the Pred, given more Levaquin...     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    ~  He saw CARDS PA 03/01/16> no change in meds, Lexiscan Myoview 03/06/16 showed hi risk study w/ EF=30%, no ST segm changes, c/w antero-apical MI & no evid reversible ischemia...    ASPVD- s/p AAA stent graft 11/2012, mod bilat carotid occlusive dis (asymptomatic) w/ CDopplers showing ~50-60% bilat ICAstenoses>  [facility]- plans yearly f/u CT Abd and CDopplers    MEDICAL issues>  HBP, HL, GERD/ Divertic/ Polyps, CKD-stage3, Anemia & thrombocytopenia>  On Simva80-1/2Qd, Prilosec20/d, Fe/ MVI etc... EXAM shows Afeb, VSS, O2sat=95% on RA; Wt=183# (up 5#);  HEENT- neg, mallampati2;  Chest- sl decr BS right base, few rhonchi, w/o w/r/consolidation;  Heart- RR Gr1/6SEM w/o r/g;  Abd- soft, neg;  Ext- neg w/o c/c/e...  CT Chest 05/10/16 showed more confluent airsp dis in RUL w/ vol loss, abn soft tissue attenuation in right hilum has progressed (?felt to be treatment related), stable subcarinal LN ~1161mmoderate right effusion.  CXR 06/06/16> progressive incr density in RUL & vol loss, soft tissue fullness in right hilum, s/p CABG and calcif in wall of Ao arch  PET scan 06/15/16> persistent but diminished hypermetabolism in the RUL area of interstitial & airsp opac-  likely related to XRT, no hypermetabolism in the prev R paratrachial LN, similar sized subcarinal LN w/ low level hypermetabolism noted; new area of LUL anterior  interstitial & airsp dis w/ some bronchiectasis shows low level hypermetabolism (?radiation change?); moderate right effusion...  IMP/PLAN>>  He is feeling better, performance status improving, CXR shows progressive changes in RUL s/p chemoradiation; CT=>PET scans w/ vol loss & persistent hypermetabolism/ evolving changes in RUL & right hilum plus he has a mod large right effusion; prev thoracentesis 01/2016 in hosp was transudative- I believe he would benefit from further eval/ repeat thoracentesis to recheck this fluid; we will set this up via IR & ask them to drain the fluid as much as poss, send it for Cytology, cell ct & diff, TProt/ LDH/ Gluc...  ~  August 06, 2016:  85moROV w/ SN>  BAldinreports that his SOB & energy have improved, appetite is better; he is more active walking 271m5d/wk + yard work/ mowing/ leaves/ etc; he notes min cough, small amt beige sput, stable DOE, no CP or edema... He tells me that DrMohamed has CT Chest & LABS ordered for next week so we will wait for these tests & review when avail...     COPD (Stage3 w/ signif revers component), former smoker (quit 1996 w/ ~40+ pack-yr hx)>  On Symbicort160-2spBid regularly ([facility] changed Rx), VentolinHFA rescue inhaler as needed...    He saw DrCherly Hensenor CARDS 07/04/16>  HBP, CAD- s/p CABG 1996 & subseq PCI to graft vessels, chr combined sys&diast CHF w/ cardiomyopathy, PVD- w/ carotid dis & Ao stent graft per drLawson, HL, CKD;  No changes made to his ASA/ Plavix, Metoprolol50-1.5Bid, Lasix40, Simva40...     EXAM shows Afeb, VSS, O2sat=98% on RA; Wt=187# (up 4#);  HEENT- neg, mallampati2;  Chest- sl decr BS right base, few rhonchi, w/o w/r/consolidation;  Heart- RR Gr1/6SEM w/o r/g;  Abd- soft, neg;  Ext- neg w/o c/c/e...  Throacentesis 06/22/16 by IR>  1.2L removed (hazy yellow fluid)=> Cell ct=923 cells w/ 2 Neutro/ 58 lymph/ 40 mono-macrophages;  Chems- LDH=119, TPro=4.1, Gluc=101;  Cyto= Atypic cells c/w reactive mesothel cells.  Post  thoracentesis CXR 06/22/16>  decr right effusion, no pneumoth, dense consolid RUL unchanged, post-op BABG...  CT Chest 08/14/16>  Norm heart size- s/p CABG & Ao atherosclerotic changes; stable upper lim adenopathy; decr right effusion- now small in size; similar interstitial & airsp dis in right perihilar & medial LUL is c/w radiation fibrosis; no signif pulm nodules or masses- no new or progressive dis...   LABS 08/14/16>  Chems- ok x Cr=1.6;  CBC- ok w/ Hg=12.7...Marland KitchenMarland KitchenMP/PLAN>>  BiIzaihas clinically stable, no clear evid of recurrent dis & followed very closely by DrMohamed; Pulm stable on Symbicort Bid, Albut rescue prn, and his exercise- continue same & we plan rov recheck in 28m40mo  ~  November 05, 2016:  28mo38mo & Yidel indicates "stronger, better" on diet & exercise program w/ good energy (eg- climbed steps at a ball game);  He denies much cough (clear throat), small amt clear sput, no blood, SOB w/ exertion only- ADLs ok & DOE stable, no CP & he describes 2mi 25mk 5d/wk & he stays busy...  [facility] (sl incr subcarinal LN, scarring & radiation fibrosis, ?sl incr right effusion)... 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    ~  [place]2 in 01/2016>  Fever, cough, increased RUL opac (PCT<0.10, he received 10d Levaquin) & right>left effusion w/ thoracentesis 02/22/16 removing 1200cc clear yellow fluid, prob transudate, & BNP was >900; meds adjusted & he was diuresed w/ improvement but 2DEcho revealed worsening CHF w/ EF ~30-35% + HK & AK => he has f/u pending w/ Cards...    ~  [facility]  He had right pleural effusion tapped 02/22/16=> Prob TRANSUDATE w/ TProt<3.0, LDH=95, Cytology=NEG (reactive mesothelial cells only);  Cults=> no growth    ~  03/2016 developed cough/ wheezing/ low grade fever=> CXR w/ improved RUL, ?incr markings R base, he was off the Pred, given more Levaquin...     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    ~  He saw CARDS PA 03/01/16> no change in meds, Lexiscan Myoview 03/06/16 showed hi risk study w/ EF=30%, no ST segm changes, c/w antero-apical MI & no evid reversible ischemia...    ASPVD- s/p AAA stent graft 11/2012, mod bilat carotid occlusive dis (asymptomatic) w/ CDopplers showing ~50-60% bilat ICAstenoses>  [facility]- plans yearly f/u CT Abd and CDopplers    MEDICAL issues>  HBP, HL, GERD/ Divertic/ Polyps, CKD-stage3 (cr=1.6), Anemia (Hg=11.9) & thrombocytopenia (plat=171K)>  On Simva80-1/2Qd, Prilosec20/d, Fe/ MVI etc... EXAM shows Afeb, VSS, O2sat=98% on RA; Wt=189# (up 2#);  HEENT- neg,  mallampati2;  Chest- sl decr BS right base, few rhonchi, w/o w/r/consolidation;  Heart- RR Gr1/6SEM w/o r/g;  Abd- soft, neg;  Ext- neg w/o c/c/e...  CT Chest 10/30/16>  Norm heart size, s/p CABG, atherosclerosis in Ao; prev 176msubcarinal LN now measures 1368msl incr in right pleural effusion, radiation fibrosis & changes in medial right lung and ant LULare stable/ unchanged;   LABS 10/29/16>  Chems- wnl w/ Cr=1.6, LFTs= wnl;  CBC- ok w/ Hg=11.9, WBC=10.1  Ambulatory Oximetry 11/05/16>  O2sat=96% on RA at rest w/ pulse=81/min;  He ambulated 3 laps in office (185'each) w/ lowest O2sat=90% w/ pulse 99/min... IMP/PLAN>>  Paul Sandoval doing satis on Symbicort160-2spBid & Albut rescue inhaler prn; he states that he is active, energy is good, & that he's getting stronger &  better (good performance status); I suspect that he has residual cancer in the right chest but it appears slowly progressive (w/ sl incr right effusion & subcarinal LN) and DrMohamed to review status & decide regarding additional therapy at this point vs continued observation... we plan rov recheck in 3-59mo  NOTE:  >50% of this 25 min rov was spent in counseling & coordination of care...  ~  Jan 02, 2017:  285moOV & Paul Sandoval called 4/24 c/o cough, yellow sput, low grade fever after having been Rx x2 by PCP w/ Doxy (note- Flu-B was pos in resp viral panel); we placed him on Levaquin & Medrol dosepak & he is some better- still congested and phlegm is beige but feeling better overall; we discussed addition of Mucinex 1200bid + fluids vs Guaifenesin 400-2Tid to save $$...     COPD> on Symbicort160-2spBid and Ventolin-HFA prn    Lung cancer- followed by DrMohamed & DrManning> RUL non-small cell ca IIIA, dx 3/17 & rx w/ Chemoradiation; subseq RUL opac (infection vs XRT-fibrosis & right effusion;  He saw DrMohamed 11/15/16- note reviewed & he was doing well at that time, playing golf & feeling good; CT Chest 10/30/16 w/ sl incr right effusion, sl incr  subcarinal LN from 10=>1348mradiation fibrosis areas stable; DrMohamed felt there was no clear sign of dis progression & rec f/u scans in 740mo78moe 01/2017)...    Cardiac- CAD, s/p CABG, Cardiomyopathy, CHF> on ASA/ Plavix, Metop25Bid, Lasix40; he saw DrNiCherly Hensen7/18- note reviewed; EF=30%, he mentioned more aggressive cardiac w/u & rx if cancer progrosis favorable (no changes made, f/u 5mo 340moDEcho)...    ASPVD- s/p AAA stent graft, bilat carotid dis> on ASA/ Plavix    Medical issues>  HBP, HL, GERD/ Divertic/ Polyps, CKD-stage3 (cr=1.6), Anemia (Hg=11.9) & thrombocytopenia (plat=171K)> EXAM shows Afeb, VSS, O2sat=98% on RA; Wt=183# (down 6#);  HEENT- neg, mallampati2;  Chest- sl decr BS right base, few rhonchi, w/o w/r/consolidation;  Heart- RR Gr1/6SEM w/o r/g;  Abd- soft, neg;  Ext- neg w/o c/c/e... IMP/PLAN>>  Paul Sandoval but it's been a slow process; he is at high risk for cancer progression & followed closely by DrMohamed who plans f/u CT & recheck in June;  Continue current meds, add Guaifenesin 2400mg/8mwater to aid in mucus production... We will recheck in 22mo & 27moew his f/u scans planned by Oncology.  ~  February 04, 2017:  40mo ROV60modd-on appt requested for cough, thick yellow sput, chest congestion w/ wheezing>  Notes low grade temp in the afternoon w/o c/s;  Denies CP/ palpit/ hemoptysis/ change in SOB/DOE; He is taking SYMBICORT160-2spBid, GFN 400-2Tid + fluids, plus rescue inhaler => we gave him a NEB Rx in office (min relief), sent sput for C&S (grew abundant NTF only); we discussed need for f/u CXR (CT due soon from Mohamed[place]/ Levaquin/ Pred taper...    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    ~  HSalemx2 in 01/2016>  Fever, cough, increased RUL opac (PCT<0.10, he received 10d Levaquin) & right>left effusion w/ thoracentesis 02/22/16 removing 1200cc clear yellow fluid, prob transudate, & BNP was >900; meds adjusted & he was diuresed w/ improvement but 2DEcho revealed worsening CHF w/ EF ~30-35% + HK & AK => he has f/u pending w/ Cards...    ~  [facility]  He had right pleural effusion tapped 02/22/16=> Prob TRANSUDATE w/ TProt<3.0, LDH=95, Cytology=NEG (reactive mesothelial cells only);  Cults=> no growth    ~  03/2016 developed cough/ wheezing/ low grade fever=> CXR w/ improved RUL, ?incr markings R base, he was off the Pred, given more Levaquin...     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    ~  He saw CARDS PA 03/01/16> no change in meds, Lexiscan Myoview 03/06/16 showed hi risk study w/ EF=30%, no ST segm changes, c/w antero-apical MI & no evid reversible ischemia...    ASPVD- s/p AAA stent  graft 11/2012, mod bilat carotid occlusive dis (asymptomatic) w/ CDopplers showing ~50-60% bilat ICAstenoses>  [facility] plans yearly f/u CT Abd and CDopplers    MEDICAL issues>  HBP, HL, GERD/ Divertic/ Polyps, CKD-stage3 (cr=1.6), Anemia (Hg=11.9) & thrombocytopenia (plat=171K)>  On Simva80-1/2Qd, Prilosec20/d, Fe/ MVI etc... EXAM shows Afeb, VSS, O2sat=97% on RA; Wt=179# (down 4#);  HEENT- neg, mallampati2;  Chest- sl decr BS right base, few rhonchi, w/o w/r/consolidation;  Heart- RR Gr1/6SEM w/o r/g;  Abd- soft, neg;  Ext- neg w/o c/c/e...  CXR 02/04/17 (independently reviewed by me in the PACS system)> ?loculated mod right pleural effusion, RUL airsp dis is unchanged & c/w post XRT changes; left lung is clear, stable cardiac silhouette & prior CABG  Sputum C&S> abundant NTF, no pathogen identified... IMP/PLAN>>  We discussed the need for another thoracentesis=> check cytology;  We will Rx w/ empiric LEVAQUIN & PREDNISONE taper, plus his GFN at max doses w/ fluids;  Also awaiting f/u CT Chest due soon...  ADDENDUM>> Thoracentesis by IR 02/11/17>  280cc of dark bloody fluid removed, due to loculation no additional fluid was obtainable;  Fluid= exudative. & Cytology= NEGATIVE... ADDENDUM>> f/u CT Chest done 02/13/17>  Norm heart size, aortic atherosclerosis, prev CABG; mod loculated right pleural effusion; mass-like architectural distortion in RUL c/w XRT, new area of airsp consolidation in RML~3cm, mult faint nodular densities scattered on the right lung, borderline subcarinal node; Abd & musculoskeletal are ok... ADDENDUM>>  02/15/17>  Pt indicates that he is ?no better, c/o persistent cough, congested, thick yellow sput, no hemoptysis, low grade temps 99-100 range, denies much CP (just sore from coughing) and SOB/DOE about the same;  We discussed his limited options here & have suggested the following--    1) keep PRED 9m/d for now;  Add empiric AUGMENTIN 8778mone tab bid x10d  (plus Align/Activia);  Add NEBULIZER w/ ALBUTEROL TID regularly and space out the Symbicort Rx still at 2sp Bid...Marland KitchenMarland Kitchen  2) check 2DEcho to compare EF to 7/17 (30-35%)    3) refer to TCTS for the chr right pleural effusion to consider poss chest tube vs VATS vs decortication surg (DrBartle did his CABG yrs ago)  ~  July 18.2018:  80m37moV & post hosp visit>  BilGarries undergone an extensive work up over the last month or so w/ thoracentesis, CT Chest, TSurg consultation followed by VATS drainage & pleurosesis, and f/u 2DEcho showing worsening LVF=> referred to CHF clinic & there recommendations are pending...    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) presenting w/ RUL mass w/ mediastinal adenopathy 3/17;  He received chemoradiation w/ wkly carboplatin & paclitaxel (5 cycles w/ part response) + XRT from DrManning finished 01/26/16;  Follow up CT Chest 02/13/17 reviewed (see above), & DrMohamed felt there was no recurrence or dis progression but +for inflamm changes in RLL & loc pleural effusion; rec to re-scan in 46mo4mo We set him up to see DrBartle 02/25/17> he reiewed prev eval, scan, thoracenteses, etc; he agreed w/ performing a right VATS to drain the resid effusion 7 examine the pleural space for tumor/ infection etc followed by pleurodesis...    He was ADM 7/6 - 03/08/17 by DrBartle w/ right VATS w/ drainage of pleural effusion & Bx of pleural surface + talc pleurodesis=> all pathology was benign (fibrous tissue & chr inflamm; Disch on similar med regimen + [id number] stable since surg- +SOB/DOE w/ stairs etc, reports eating well, no CP & hasn't required pain meds; denies f/c/s, notes min cough & sm amt whitish sput- no color, no blood... Finishing Levaquin, off Pred, on NEB w/ Albut Tid, Symbicort160-2spBid, GFN400-2Tid + fluids...    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 EXAM shows Afeb, VSS, O2sat=99% on RA; Wt=174# (down 5#);  HEENT- neg, mallampati2;  Chest- sl decr BS right base, few rhonchi, w/o w/r/consolidation;  Heart- RR Gr1/6SEM w/o r/g;  Abd- soft, neg;  Ext- neg w/o c/c/e...  2DEcho done 02/26/17 showed mod dil LV & severely reduced LVF w/ EF down to 20-25%, Gr1DD, mod calcif AoV leaflets, MV was OK, RV mod dilated & sys function mildly reduced, no PAsys reported...Marland KitchenMarland Kitchen  IMP/PLAN>>  The worsening LVF w/ EF 19-37% is certainly playing a roll in his unrespolved dyspnea=> refer to CHF clinic for active management; in the interim continue NEBs, Symbicort, Mucinex, etc...   ~  April 24, 2017:  6wk Miranda had CHF clinic eval by [facility] on 04/22/17-- note reviewed, Hx CAD w/ CABG in 1996 & PCI in 2015, ischemic cardiomyopathy, and c/o incr SOB & recent 2DEcho (02/2017) showed EF=20-25%;  DrMcLean adjusted his meds by changing Metoprolol to Coreg & adding Entresto, ultimately he would like to add Spironolactone; they discussed R+L heart cath soon (sched 9/14)...      Paul Sandoval has had f/u appts w/ PCP-DrLuking on 03/14/17 (transitional care follow up) and TCTS-DrBartle on 03/20/17>  Notes reviewed, his breathing is somewhat improved, on Levaquin for Serratia, DrBartle noted>>  Most of the right lung is firmly adherent to the chest wall from his prior XRT. There is a loculated fluid collection at the apex of the chest that is sealed in by the lung and could not be drained. There was a large collection in the  lower pleural space that was drained and talc pleurodesis performed. Hopefully this space will resolve over time with the talc and not refill with fluid. There is no evidence of malignancy so it is possible that this is related to heart  failure given his EF of 20-25%. He reports that he has started back to walking & slowly building up, even played 2 nine-hole rounds of golf when it wasn't too humid; he has a resid cough but decr sput & easier to produce, he requests a handicap placard... See prob list above... EXAM shows Afeb, VSS, O2sat=96% on RA; Wt=178# (up 4#);  HEENT- neg, mallampati2;  Chest- sl decr BS right base, few rhonchi, w/o w/r/consolidation;  Heart- RR Gr1/6SEM w/o r/g;  Abd- soft, neg;  Ext- neg w/o c/c/e...  CXR 04/24/18 (independently reviewed by me in the PACS system) shows left lung clear, right sided vol loss, small A/F levels at the base, pleural fluid vs thickening at right base, stable soft tissue fullness at right apex, s/p CABG etc. IMP/PLAN>>  Damarion appears stable post T-surg & improved w/ CHF-clinic Rx per DrMcLean;  OK 2018 FLU shot today, and continue pulm rx w/ Albut NEBS Tid, Symbicort160-2spBid, GFN400-2Bid;  Call for any questions and we plan f/u visit in 30mo..  ~  June 24, 2017:  218moOV & Tramell presents w/ a 2d hx incr cough, yellow sput, & chest congestion- denies f/c/s;  He's been using NEBs Qid (incr per DrLuking), plus his Symbicort160-2spBid & GFN400-2Tid;  He had a ZPak called in recently;  We discussed BacterimDS Bid x10d for  this COPD exac + Pred20- 5d slow tapering protocol (see AVS) w/ rov recheck 6wks...     He saw TCTS-DrBartle 05/22/17>  S/p bronch & right VATS to drain the loculated right effusion & talc pleurodesis 03/01/17;  At surg he noted most of the right lung was firmly adherent to the chest wall from his prior XRT. There was a loculated fluid collection at the apex of the chest that was sealed in by the lung and could not be drained. There was a large collection in the lower pleural space that was drained and talc pleurodesis performed. Unfortunately the right lower lobe was partially collapsed and chronically scarred and would not expand to fill in the space formed after draining the effusion => they signed off...    He saw V[facility]-DrCain 05/24/17>  Hx AAA- s/p endovasc stent graft repair, & hx Carotid art dis being followed;  Abd Ao Duplex was stable w/ 3.3 x 3.4cm aneurysm w/o elev velocities;  CDuplex w/ 40-59% RICA stenosis & 1-24% LICA and they plan recheck 35yr..    He saw CARDS-DrMcLean 05/27/17>  CAD, s/p CABG 1996, subseq PCIs, RBBB, ischemic cardiomyopathy/ CHF w/ 2DEcho showing EF=20-25% in 7/18;  Also has Hx AAA w/ prec stent graft repair;  He had CATH 04/2017 showing occluded SVG-small D, patent LIMA-LAD and SVG-PDA, 50% shelf-like stenosis distal left main (no intervention), filling pressure not elevated and cardiac output preserved.  On Coreg & Entresto, they added spironolactone & f/u 27mo   He saw ONC-DrMohamed 05/28/17>  DIAGNOSIS: Stage IIIA (T2b, N2, M0) non-small cell lung cancer, favoring squamous cell carcinoma presented with right upper lobe lung mass in addition to mediastinal lymphadenopathy diagnosed in March 2017.  PRIOR THERAPY:  A course of concurrent chemoradiation with weekly carboplatin for AUC of 2 and paclitaxel 45 MG/M2. Status post 5 cycle with partial response.  CURRENT THERAPY: Observation.  He had f/u CXR & CT Chest (see below)- no evid of dis progression/ recurrence & f/u planned  37m59mo    He saw PCP-DrLuking 06/06/17>  Annual wellness visit, note reviewed, same meds & they discussed compliance issues... EXAM shows Afeb, VSS, O2sat=98% on RA; Wt=178# (stable);  HEENT- neg, mallampati2;  Chest- sl decr BS right base, few rhonchi, w/o w/r/consolidation;  Heart- RR Gr1/6SEM w/o r/g;  Abd- soft, neg;  Ext- neg w/o c/c/e...  CT Chest 05/21/17>  See report -- IMPRESSION:  1) Similar radiation fibrosis within the paramediastinal right lung.  2) Increase in ill-defined micro nodularity bilaterally. Favor infection, including atypical etiologies.  3) Interval T4 compression deformity, favored to be posttraumatic. No metastasis identified in this region on the prior CT.  4) Aortic Atherosclerosis.  5) Loculated right-sided pleural fluid and inferior right hydropneumothorax, similar.  6) Tiny hiatal hernia. Gastric underdistention with possible gastric wall thickening suggestive of gastritis.  7) Similar borderline subcarinal adenopathy.  8)  Emphysema.  CXR 05/22/17>  Norm heart size w/ prev CABG & ao calcif, stable vol loss on the right & soft tissue fullness in right hilum, w/ consolid right apex, mod right effusion w/ loculated air/fluid IMP/PLAN>>  As above- we discussed treating THIS COPD exac w/ BacterimDS & PRED taper; he is to continue w/ his NEBS, Symbicort, GFN and we plan recheck in 6wks time...   ~  August 08, 2017:  6wk ROV & it is unclear if BilJoshvaer really cleared up after the last OV- he'd been treated w/ ZPak, then BacterimDS w/ Pred taper but returns today stating "not good, I need a little help, getting weaker"; he brought in a sputum sample w/ thick beige sput=> cult grew moraxella cataralis & we discussed treatment this go-round w/ AUGMENTIN + Depo80 & a prolonged slow tper of Medrol8mg33mbs...     He had one interim f/u visit 08/02/17 w/ CARDS-DrMcLean> CAD, s/p CABG 1996, subseq PCIs, RBBB, ischemic cardiomyopathy/ CHF w/ 2DEcho showing EF=20-25% in 7/18;  Note  reviewed- they titrated up his Coreg to 9.375Bid, Entresto to 24/26Bid, & Spironolactone to 25mg21m. We reviewed the following medical problems during today's office visit >>     COPD (Stage3 w/ signif revers component), former smoker (quit 1996 w/ ~40+ pack-yr hx)>  On NEBs w/ Albut Qid, Symbicort160-2spBid regularly, VentolinHFA rescue inhaler & GFN as needed; with persistent symptoms we discussed changing  Albut to DUONEB Qid, Symbicort160-2spBid, incr GFN400 to 2tabsTid + fluids & to work hard expectorating the mucus...    Freq COPD exacerbations & sput C&S growing Serratia 7/18 (resist to cephalosporins), and MCat 12/18 (wasn't better after ZPak & BacterimDS, therefore switched to Augmentin rx)... Now w/ MCat in sput & no better after ZPak & BacterimDS + Pred- we are treating w/ Augmentin x10d & long slow Medrol taper...     Non-small cell lung cancer, Stage IIIA, diagnosed 10/2015 & treated by DrMohamed & DrManning w/ Chemoradiation (carboplatin & paclitaxel)     ~  Bronch 11/17/15 by Kallie Edward showed no endobronchial lesions- TBBx were neg, but brushing & washings were pos for malig cells (felt to be c/w SqCellCa)    ~  He received ChemoRx from DrMohamed with weekly carboplatin and paclitaxel status post 4 cycles finished 01/24/16 (dose held on several occas due to side effects)    ~  He received XRT 4/17 - 01/26/16 by DrManning to the primary tumor & involved mediastinal adenopathy w/ 66 Gy (33 fractions of 2Gy each); he had mod esophagitis & some fatigue, plus post radiation scarring in the lung...    ~  DrMohamed follows w/ serial CT Chest scans every 83mo=> now every 660mo/ f/u due 11/2017...    Abnormal CXR w/ RUL mass, s/p treatment as above, & subseq RUL opac (?infection vs radiation fibrosis changes), right pleural effusion>      ~  Treated w/ antibiotic Rx & PREDNISONE taper (30-25-20-15-10-5 Q5d over 1m56moer DrManning...    ~  [facility]  Fever, cough, increased RUL opac (PCT<0.10, he  received 10d Levaquin) & right>left effusion w/ thoracentesis 02/22/16 removing 1200cc clear yellow fluid, prob transudate, & BNP was >900; meds adjusted & he was diuresed w/ improvement but 2DEcho revealed worsening CHF w/ EF ~30-35% + HK & AK => Cards f/u.    ~  [facility]  He had right pleural effusion tapped 02/22/16=> Prob TRANSUDATE w/ TProt<3.0, LDH=95, Cytology=NEG (reactive mesothelial cells only);  Cults=> no growth    ~  Progressive changes in right chest and recurrent symptoms over 2017-2018 w/ interval thoracentesis etc-- all led up to right VATS surg & talc pleurodesis 02/2017 by Dr BarCyndia Bentll path was benign)...    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    ~  He saw CARDS PA 03/01/16> no change in meds, Lexiscan Myoview 03/06/16 showed hi risk study w/ EF=30%, no ST segm changes, c/w antero-apical MI & no evid reversible ischemia...    ~  Referred to DrMcLean's CHF clinic 7/18 w/ change in meds and titration of Coreg, Entresto, Spironolactone => on-going f/u & titration of meds...    ASPVD- s/p AAA stent graft 11/2012, mod bilat carotid occlusive dis (asymptomatic) w/ CDopplers showing ~50-60% bilat ICAstenoses>  [facility] plans yearly f/u CT Abd and CDopplers    MEDICAL issues>  HBP, HL, GERD/ Divertic/ Polyps, CKD-stage3 (cr=1.6), Anemia (Hg=11.9) & thrombocytopenia (plat=171K)>  On Simva80-1/2Qd, Prilosec20/d, Fe/ MVI etc... EXAM shows Afeb, VSS, O2sat=95% on RA; Wt=183# (stable);  HEENT- neg, mallampati2;  Chest- sl decr BS right base, few  rhonchi, w/o w/r/consolidation;  Heart- RR Gr1/6SEM w/o r/g;  Abd- soft, neg;  Ext- neg w/o c/c/e...  LABS 08/02/17>  Chems- ok w/ Cr=1.43,  K=4.0 IMP/PLAN>>  As noted above Paul Sandoval is having a rough time lately>  He had the right VATS surg & talc pleurodesis 02/2017;  He has a severe ischemic cardiomyopathy w/ EF= 20-25% & now followed closely by DrMcLean w/ freq (Q73mo visits in the CHF clinic for medication titration;  He has severe obstructive lung dis w/ FEV1=1.25 (40%) in 01/2016 and freq exacerbations as documented above;  We reviewed recommendations for NEBS w/ DUONEB Qid, Symbicort160-2spBid, GFN400-2tabs Tid w/ fluids;  We are starting a 10d course of AUGMENTIN875Bid and a long slow MEDROL taper w/ planned recheck in 6wks;  He will continue to work on chest physiotherapy & bronchial mucus hygiene...   ~  September 19, 2017:  6wk ROV & pulmonary follow up> BRush Landmarkis stable on his medication regimen as outlined above; he reports another URI w/ yellow/ green mucus & treated by his PCP (DrLuking) w/ [employer] he is still on the Medrol 869m 1/2 tab Qod; overall he feels that his SOB/ breathing is much improved;  He remains on DUONEB Qid, Symbicort160-2spBid, GFN400-2Tid w/ fluids, & currently on Medrol4m76md=> rec to stay on this...     COPD- GOLD Stage3 w/ revers component, & freq infectious exac> rec to be diligent w/ NEB treatments, Symbicort, GFN, etc...     Hx non-small cell lung cancer, stage IIIA- dx 10/2015 & treated by DrMohamed & DrManning w/ chemoradiation; he is due for 45mo85mo w/ oncology 11/2017...    Abn CXR- s/p treatment as noted, subseq RUL opac (?infection vs radiation fibrosis?) & right effusion...    CAD, s/pCABG1996, RBBB, ischemic cardiomyopathy w/ acute on chr sys & diast CHF> followed by DrMcLean's CHF clinic...    ASPVD- s/p AAA stent graft 2014, mod bilat carotid dis...    Medical issues> HBP, HL, GERD/ divertics/ polyps, CKD-stage3 (Cr~1.6), anemia, etc...  EXAM shows Afeb, VSS, O2sat=95% on RA; Wt=180# (stable);  HEENT- neg, mallampati2;  Chest- sl decr BS right base, few rhonchi, w/o w/r/consolidation;  Heart- RR  Gr1/6SEM w/o r/g;  Abd- soft, neg;  Ext- neg w/o c/c/e...  LABS 09/19/17>  FLP- all parameters at goals on diet alone;  Chems are ok on current meds & Cr stable at 1.54;  CBC- sl improved w/ Hg=12.3, WBC-9.4;  TSH=2.66;  BNP=413;  Sed=51... REC to continue Lasix40, & no salt diet + continue MEDROL 4mg 85m...  IMP/PLAN>>  Paul Sandoval required mult antibiotics to get over his recent upper resp infection=> ZPak, then Bacterim, then Augmentin, then Omnicef; he remains on Medrol4mgQo28m is asked to stay on this; he requested follow up labs (as above); reminded to stay as active as poss w/ a regular exercise program...     Past Medical History:  Diagnosis Date  . AAA (abdominal aortic aneurysm) (HCC) [place]   4.4 cm 08/2008;4.44 in 7/10 and 4.65 in 08/2009; 4.8 by CT in 11/2009; 4.3 by ultrasound in 08/2010  . Anemia   . Arteriosclerotic cardiovascular disease (ASCVD) 1996   CABG-1996  . Arthritis    "fingers" (03/18/2014)  . CAD (coronary artery disease)    03/18/14:  PCI with DES to distal left main. 7/29: DES to the SVG to Diag  . Cancer (HCC)  [place] right lobe lung cancer  . Cardiomyopathy, ischemic    Echo 03/17/14: EF 45-50%  . Chronic bronchitis (HCC)  [place]  Chronic kidney disease    CR[facility]  . Chronic rhinitis   . Colonic polyp 2002   polypectomy in 2002  . COPD (chronic obstructive pulmonary disease) ([place])   . Diverticulosis   . Dyspnea    with exertion  . ED (erectile dysfunction)   . Encounter for antineoplastic chemotherapy 12/19/2015  . GERD (gastroesophageal reflux disease)   . History of blood transfusion   . Hyperlipidemia   . Hypertension   . IFG (impaired fasting glucose)   . Myocardial infarction [facility])    "told h/o silent MI sometime before 1996"  . Pneumonia ~ 2001; ~ 2005    has had more than twice  . Right bundle branch block   . Tobacco abuse, in remission    40 pack year total consumption; discontinued in 1996    Past Surgical History:  Procedure Laterality Date  .  ABDOMINAL AORTIC ANEURYSM REPAIR  11/2012  . ABDOMINAL AORTIC ENDOVASCULAR STENT GRAFT N/A 12/11/2012   Procedure: ABDOMINAL AORTIC ENDOVASCULAR STENT GRAFT;  Surgeon: Mal Misty, MD;  Location: [place];  Service: Vascular;  Laterality: N/A;  Ultrasound guided; [place]  . CARDIAC CATHETERIZATION  01/08/1995  . COLONOSCOPY  2002   polypectomy-patient denies  . CORONARY ANGIOPLASTY WITH STENT PLACEMENT  03/18/2014   "1"  . CORONARY ANGIOPLASTY WITH STENT PLACEMENT  03/24/2014   "1"  . CORONARY ARTERY BYPASS GRAFT  01/09/1995   "CABG X3"  . FLEXIBLE BRONCHOSCOPY N/A 03/01/2017   Procedure: FLEXIBLE BRONCHOSCOPY WITH BIOPSIES;  Surgeon: Gaye Pollack, MD;  Location: [facility] OR;  Service: Thoracic;  Laterality: N/A;  . JOINT REPLACEMENT    . LAPAROSCOPIC CHOLECYSTECTOMY  12/2009  . LEFT AND RIGHT HEART CATHETERIZATION WITH CORONARY/GRAFT ANGIOGRAM N/A 03/18/2014   Procedure: LEFT AND RIGHT HEART CATHETERIZATION WITH Beatrix Fetters;  Surgeon: Blane Ohara, MD;  Location: [facility] CATH LAB;  Service: Cardiovascular;  Laterality: N/A;  . PERCUTANEOUS CORONARY STENT INTERVENTION (PCI-S)  03/18/2014   Procedure: PERCUTANEOUS CORONARY STENT INTERVENTION (PCI-S);  Surgeon: Blane Ohara, MD;  Location: [facility] CATH LAB;  Service: Cardiovascular;;  . PERCUTANEOUS CORONARY STENT INTERVENTION (PCI-S) N/A 03/24/2014   Procedure: PERCUTANEOUS CORONARY STENT INTERVENTION (PCI-S);  Surgeon: Blane Ohara, MD;  Location: [facility] CATH LAB;  Service: Cardiovascular;  Laterality: N/A;  . PLEURAL EFFUSION DRAINAGE Right 03/01/2017   Procedure: DRAINAGE OF PLEURAL EFFUSION;  Surgeon: Gaye Pollack, MD;  Location: [place];  Service: Thoracic;  Laterality: Right;  . RIGHT/LEFT HEART CATH AND CORONARY/GRAFT ANGIOGRAPHY N/A 05/15/2017   Procedure: RIGHT/LEFT HEART CATH AND CORONARY/GRAFT ANGIOGRAPHY;  Surgeon: Larey Dresser, MD;  Location: [place] CV LAB;  Service: Cardiovascular;  Laterality: N/A;  . TALC PLEURODESIS Right  03/01/2017   Procedure: Pietro Cassis;  Surgeon: Gaye Pollack, MD;  Location: [place];  Service: Thoracic;  Laterality: Right;  . TOTAL HIP ARTHROPLASTY Left 01/21/2013   Procedure: TOTAL HIP ARTHROPLASTY ANTERIOR APPROACH;  Surgeon: Mauri Pole, MD;  Location: [place];  Service: Orthopedics;  Laterality: Left;  Marland Kitchen VIDEO ASSISTED THORACOSCOPY Right 03/01/2017   Procedure: VIDEO ASSISTED THORACOSCOPY WITH BIOPSIES;  Surgeon: Gaye Pollack, MD;  Location: [place];  Service: Thoracic;  Laterality: Right;  Marland Kitchen VIDEO BRONCHOSCOPY N/A 11/17/2015   Procedure: VIDEO BRONCHOSCOPY WITH FLUORO;  Surgeon: Rigoberto Noel, MD;  Location: [place];  Service: Cardiopulmonary;  Laterality: N/A;    Outpatient Encounter Medications as of 09/19/2017  Medication Sig  . acetaminophen (TYLENOL) 500 MG tablet Take 500 mg  by mouth every 6 (six) hours as needed for mild pain.  Marland Kitchen aspirin EC 81 MG tablet Take 81 mg by mouth daily before breakfast.   . budesonide-formoterol (SYMBICORT) 160-4.5 MCG/ACT inhaler Inhale 2 puffs into the lungs 2 (two) times daily.  . carvedilol (COREG) 6.25 MG tablet Take 1.5 tablets (9.375 mg total) by mouth 2 (two) times daily with a meal.  . clopidogrel (PLAVIX) 75 MG tablet TAKE ONE TABLET BY MOUTH DAILY.  Marland Kitchen Ferrous Sulfate (IRON) 28 MG TABS Take 28 mg by mouth daily.  . furosemide (LASIX) 40 MG tablet TAKE ONE TABLET BY MOUTH DAILY.  . GuaiFENesin (MUCUS RELIEF ADULT PO) Take 2 capsules by mouth 3 (three) times daily.  Marland Kitchen ipratropium-albuterol (DUONEB) 0.5-2.5 (3) MG/3ML SOLN Take 3 mLs by nebulization 4 (four) times daily.  . methylPREDNISolone (MEDROL) 8 MG tablet Take 1 tablet (8 mg total) by mouth daily. Start w/ one tab twice daily for 1 week...    Then decrease to one tab each AM for 1 week...    Then decrease to 1/2 tab daily in the AM for 1 week...    Then decrease to 1/2 tab every other day til return visit (1/2, 0, 1/2, 0, etc)...  . Multiple Vitamins-Minerals (CENTRUM SILVER  ADULT 50+) TABS Take 1 tablet by mouth daily with lunch.   Marland Kitchen NITROSTAT 0.4 MG SL tablet PLACE 1 TAB UNDER TONGUE EVERY 5 MIN IF NEEDED FOR CHEST PAIN. MAY USE 3 TIMES.NO RELIEF CALL 911.  . omeprazole (PRILOSEC) 20 MG capsule TAKE ONE CAPSULE BY MOUTH DAILY.  . Probiotic Product (FLORAJEN3 PO) Take 1 tablet by mouth daily.  . sacubitril-valsartan (ENTRESTO) 24-26 MG Take 1 tablet by mouth 2 (two) times daily.  . simvastatin (ZOCOR) 80 MG tablet Take 0.5 tablets (40 mg total) by mouth at bedtime.  Marland Kitchen spironolactone (ALDACTONE) 25 MG tablet Take 1 tablet (25 mg total) by mouth daily.  . VENTOLIN HFA 108 (90 Base) MCG/ACT inhaler INHALE 2 PUFFS BY MOUTH EVERY 4 TO 6 HOURS AS NEEDED FOR WHEEZING.  . [DISCONTINUED] cefdinir (OMNICEF) 300 MG capsule Take 1 capsule (300 mg total) by mouth 2 (two) times daily.  . [DISCONTINUED] albuterol (PROVENTIL) (2.5 MG/3ML) 0.083% nebulizer solution Take 3 mLs (2.5 mg total) by nebulization 4 (four) times daily. (Patient not taking: Reported on 09/19/2017)   No facility-administered encounter medications on file as of 09/19/2017.     Allergies  Allergen Reactions  . Neomycin Hives    Immunization History  Administered Date(s) Administered  . H1N1 08/06/2008  . Influenza Split 06/24/2013  . Influenza,inj,Quad PF,6+ Mos 06/15/2014, 05/31/2015, 05/09/2016, 06/06/2017  . Influenza-Unspecified 05/27/2012  . Pneumococcal Conjugate-13 08/18/2014  . Pneumococcal Polysaccharide-23 03/27/2012  . Zoster 07/27/2008    Current Medications, Allergies, Past Medical History, Past Surgical History, Family History, and Social History were reviewed in [employer] record.   Review of Systems             All symptoms NEG except where BOLDED >>  Constitutional:  F/C/S, fatigue, anorexia, unexpected weight change. HEENT:  HA, visual changes, hearing loss, earache, nasal symptoms, sore throat, mouth sores, hoarseness. Resp:  cough, sputum, hemoptysis;  SOB, tightness, wheezing. Cardio:  CP, palpit, DOE, orthopnea, edema. GI:  N/V/D/C, blood in stool; reflux, abd pain, distention, gas. GU:  dysuria, freq, urgency, hematuria, flank pain, voiding difficulty. MS:  joint pain, swelling, tenderness, decr ROM; neck pain, back pain, etc. Neuro:  HA, tremors, seizures, dizziness, syncope, weakness,  numbness, gait abn. Skin:  suspicious lesions or skin rash. Heme:  adenopathy, bruising, bleeding. Psyche:  confusion, agitation, sleep disturbance, hallucinations, anxiety, depression suicidal.   Objective:   Physical Exam       Vital Signs:  Reviewed...   General:  WD, WN, 77 y/o WM in NAD; alert & oriented; pleasant & cooperative... HEENT:  [place]/AT; Conjunctiva- pink, Sclera- nonicteric, EOM-wnl, PERRLA, EACs-clear, TMs-wnl; NOSE-clear; THROAT-clear & wnl.  Neck:  Supple w/ fair ROM; no JVD; normal carotid impulses w/ faint bruits; no thyromegaly or nodules palpated; no lymphadenopathy.  Chest:  decr BS right base & clear w/o w/r/r & no signs of consolid. Heart:  Regular Rhythm; norm S1 & S2 w/ Gr1/6 SEM without rubs or gallops detected. Abdomen:  Soft & nontender- no guarding or rebound; normal bowel sounds; no organomegaly or masses palpated. Ext:  Sl decr ROM; without deformities +arthritic changes; no varicose veins, +venous insuffic, tr edema;  Pulses decr w/o bruits. Neuro:  CNs II-XII intact; motor testing normal; sensory testing normal; gait normal & balance OK. Derm:  No lesions noted; no rash etc. Lymph:  No cervical, supraclavicular, axillary, or inguinal adenopathy palpated.   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     ASPVD- s/p AAA stent graft 11/2012, mod bilat carotid occlusive dis (asymptomatic) w/ CDopplers showing ~50-60% bilat ICAstenoses>  Stable & seen by [facility] 01/2016- plans yearly f/u CT Abd and CDopplers    MEDICAL issues>  HBP, HL, GERD/ Divertic/ Polyps, CKD-stage3, Anemia & thrombocytopenia>  On Simva80-1/2Qd, Prilosec20/d, Fe/ MVI/ etc...  PLAN>>  02/29/16>   Problem list as above w/ signif cardiac, pulmonary, & post chemoradiation changes;  From the pulm standpoint- he feels the BREO is helping & hasn't needed the rescue inhaler very often, continue same;  He is on a long PRED taper per DrManning for poss radiation fibrosis RUL, Sed rate is 53, continue this taper & careful w/ sweets/ sugar etc;  From the cardiac standpoint- he has signif underlying dis w/ Lmain stent, s/p CABG 20 yrs ago, prob ischemic cardiomyopathy w/ worsening 2DEcho recently w/ 30-35% EF + HK&AK seen, BNP=1053, Cards plans ischemic work up when able, in the meanwhile he has improved w/ diuresis but [place] w/ mild RI & HCO3=36... REC- add DIAMOX-ER 500 one tab daily at 4pm, continue his Lasix40 Qam, increase water intake, watch weights... We plan ROV 6monthrecheck... 04/04/16>   Discussed w/ Ladarius-- continue the Levaquin til gone, use Tylenol for fever or pain; continue the Breo daily & Ventolin-HFA as needed; wean off the Diamox at this time, continue Lasix40, no salt, etc; he needs to gradually increase his exercise/ mobility; we will plan ROV w/ labs in 260mo 06/06/16>   He is feeling better, performance status improving, CXR shows progressive changes in RUL s/p chemoradiation; CT=>PET scans w/ vol loss & persistent  hypermetabolism/  evolving changes in RUL & right hilum plus he has a mod large right effusion; prev thoracentesis 01/2016 in hosp was transudative- I believe he would benefit from further eval/ repeat thoracentesis to recheck this fluid; we will set this up via IR & ask them to drain the fluid as much as poss, send it for Cytology, cell ct & diff, TProt/ LDH/ Gluc => 1.2L removed, prob exudate, NEG cytology, & we will follow... 08/06/16>   Paul Sandoval is clinically stable, no clear evid of recurrent dis & followed very closely by DrMohamed; Pulm stable on Symbicort Bid, Albut rescue prn, and his exercise- continue same 7 we plan rov recheck in 24mo3/12/18>    BBraidynis doing satis on Symbicort160-2spBid & Albut rescue inhaler prn; he states that he is active, energy is good, & that he's getting stronger & better (good performance status); I suspect that he has residual cancer in the right chest but it appears slowly progressive (w/ sl incr right effusion & subcarinal LN) and DrMohamed to review status & decide regarding additional therapy at this point vs continued observation...  01/02/17>   Paul Sandoval improved but it's been a slow process; he is at high risk for cancer progression & followed closely by DrMohamed who plans f/u CT & recheck in June;  Continue current meds, add Guaifenesin 24079md + water to aid in mucus production... We will recheck in 52m13moreview his f/u scans planned by Oncology. 02/04/17>   SEE ABOVE - Rx Pred taper & Levaquin=> no better; R-Thoracentesis by IR w/ only 280cc exudative loculated fluid removed, cytology- NEG; Limited options at this point- Rx w/ Augmentin empirically, add NEBs w/ Albuterol Tid, continue GFN 2400m31m  We will check 2DEcho and refer to TCTS to consider further Rx options for right effusion... 03/13/17>   The worsening LVF w/ EF 20-271-24%certainly playing a roll in his unrespolved dyspnea=> refer to CHF clinic for active management; in the interim continue NEBs, Symbicort, Mucinex,  etc.. 04/24/17>   BillYvetteears stable post T-surg & improved w/ CHF-clinic Rx per DrMcLean;  OK 2018 FLU shot today, and continue pulm rx w/ Albut NEBS Tid, Symbicort160-2spBid, GFN400-2Bid;  Call for any questions and we plan f/u visit in 5102mo.11mo29/18>   As above- we discussed treating THIS COPD exac w/ BacterimDS & PRED taper; he is to continue w/ his NEBS, Symbicort, GFN and we plan recheck in 6wks time 08/08/17>   As noted above Paul Sandoval Paul Landmarkaving a rough time lately>  He had the right VATS surg & talc pleurodesis 02/2017;  He has a severe ischemic cardiomyopathy w/ EF= 20-25% & now followed closely by DrMcLean w/ freq (Q5102mo) 49mots in the CHF clinic for medication titration;  He has severe obstructive lung dis w/ FEV1=1.25 (40%) in 01/2016 and freq exacerbations as documented above;  We reviewed recommendations for NEBS w/ DUONEB Qid, Symbicort160-2spBid, GFN400-2tabs Tid w/ fluids;  We are starting a 10d course of AUGMENTIN875Bid and a long slow MEDROL taper w/ planned recheck in 6wks;  He will continue to work on chest physiotherapy & bronchial mucus hygiene 09/19/17>   Paul Sandoval required mult antibiotics to get over his recent upper resp infection=> ZPak, then Bacterim, then Augmentin, then Omnicef; he remains on Medrol4mgQod80mis asked to stay on this; he requested follow up labs (as above); reminded to stay as active as poss w/ a regular exercise program...    Plan:      Patient's Medications  New Prescriptions   No medications on file  Previous Medications   ACETAMINOPHEN (TYLENOL) 500 MG TABLET    Take 500 mg by mouth every 6 (six) hours as needed for mild pain.   ASPIRIN EC 81 MG TABLET    Take 81 mg by mouth daily before breakfast.    BUDESONIDE-FORMOTEROL (SYMBICORT) 160-4.5 MCG/ACT INHALER    Inhale 2 puffs into the lungs 2 (two) times daily.   CARVEDILOL (COREG) 6.25 MG TABLET    Take 1.5 tablets (9.375 mg total) by mouth 2 (two) times daily with a meal.   CLOPIDOGREL (PLAVIX) 75 MG TABLET     TAKE ONE TABLET BY MOUTH DAILY.   FERROUS SULFATE (IRON) 28 MG TABS    Take 28 mg by mouth daily.   FUROSEMIDE (LASIX) 40 MG TABLET    TAKE ONE TABLET BY MOUTH DAILY.   GUAIFENESIN (MUCUS RELIEF ADULT PO)    Take 2 capsules by mouth 3 (three) times daily.   IPRATROPIUM-ALBUTEROL (DUONEB) 0.5-2.5 (3) MG/3ML SOLN    Take 3 mLs by nebulization 4 (four) times daily.   METHYLPREDNISOLONE (MEDROL) 8 MG TABLET    Take 1 tablet (8 mg total) by mouth daily. Start w/ one tab twice daily for 1 week...    Then decrease to one tab each AM for 1 week...    Then decrease to 1/2 tab daily in the AM for 1 week...    Then decrease to 1/2 tab every other day til return visit (1/2, 0, 1/2, 0, etc)...   MULTIPLE VITAMINS-MINERALS (CENTRUM SILVER ADULT 50+) TABS    Take 1 tablet by mouth daily with lunch.    NITROSTAT 0.4 MG SL TABLET    PLACE 1 TAB UNDER TONGUE EVERY 5 MIN IF NEEDED FOR CHEST PAIN. MAY USE 3 TIMES.NO RELIEF CALL 911.   OMEPRAZOLE (PRILOSEC) 20 MG CAPSULE    TAKE ONE CAPSULE BY MOUTH DAILY.   PROBIOTIC PRODUCT (FLORAJEN3 PO)    Take 1 tablet by mouth daily.   SACUBITRIL-VALSARTAN (ENTRESTO) 24-26 MG    Take 1 tablet by mouth 2 (two) times daily.   SIMVASTATIN (ZOCOR) 80 MG TABLET    Take 0.5 tablets (40 mg total) by mouth at bedtime.   SPIRONOLACTONE (ALDACTONE) 25 MG TABLET    Take 1 tablet (25 mg total) by mouth daily.   VENTOLIN HFA 108 (90 BASE) MCG/ACT INHALER    INHALE 2 PUFFS BY MOUTH EVERY 4 TO 6 HOURS AS NEEDED FOR WHEEZING.  Modified Medications   No medications on file  Discontinued Medications   ALBUTEROL (PROVENTIL) (2.5 MG/3ML) 0.083% NEBULIZER SOLUTION    Take 3 mLs (2.5 mg total) by nebulization 4 (four) times daily.   CEFDINIR (OMNICEF) 300 MG CAPSULE    Take 1 capsule (300 mg total) by mouth 2 (two) times daily.

## 2017-09-19 NOTE — Patient Instructions (Signed)
Today we updated your med list in our EPIC system...    Continue your current medications the same...  We decided to continue the MEDROL 8mg  tabs at 1/2 tab every other day     (when these are running low- call me for a new Rx for the Medrol 4mg  tabs)...  Today we did your fasting blood work...    We will contact you w/ the results when available...   Call for any questions...  Let's plan a follow up visit in 47mo, sooner if needed for problems.Marland KitchenMarland Kitchen

## 2017-10-03 DIAGNOSIS — J449 Chronic obstructive pulmonary disease, unspecified: Secondary | ICD-10-CM | POA: Diagnosis not present

## 2017-10-08 ENCOUNTER — Ambulatory Visit (HOSPITAL_COMMUNITY)
Admission: RE | Admit: 2017-10-08 | Discharge: 2017-10-08 | Disposition: A | Discharge status: Home / Self Care | Payer: Medicare Other | Source: Ambulatory Visit | Attending: Cardiology | Admitting: Cardiology

## 2017-10-08 ENCOUNTER — Encounter (HOSPITAL_COMMUNITY): Payer: Self-pay | Admitting: Cardiology

## 2017-10-08 VITALS — BP 126/68 | HR 91 | Wt 185.1 lb

## 2017-10-08 DIAGNOSIS — I5022 Chronic systolic (congestive) heart failure: Secondary | ICD-10-CM | POA: Diagnosis not present

## 2017-10-08 DIAGNOSIS — Z79899 Other long term (current) drug therapy: Secondary | ICD-10-CM | POA: Diagnosis not present

## 2017-10-08 DIAGNOSIS — J90 Pleural effusion, not elsewhere classified: Secondary | ICD-10-CM | POA: Diagnosis not present

## 2017-10-08 DIAGNOSIS — Z820 Family history of epilepsy and other diseases of the nervous system: Secondary | ICD-10-CM | POA: Insufficient documentation

## 2017-10-08 DIAGNOSIS — Z96649 Presence of unspecified artificial hip joint: Secondary | ICD-10-CM | POA: Diagnosis not present

## 2017-10-08 DIAGNOSIS — Z87891 Personal history of nicotine dependence: Secondary | ICD-10-CM | POA: Diagnosis not present

## 2017-10-08 DIAGNOSIS — Z8249 Family history of ischemic heart disease and other diseases of the circulatory system: Secondary | ICD-10-CM | POA: Diagnosis not present

## 2017-10-08 DIAGNOSIS — Z801 Family history of malignant neoplasm of trachea, bronchus and lung: Secondary | ICD-10-CM | POA: Insufficient documentation

## 2017-10-08 DIAGNOSIS — J449 Chronic obstructive pulmonary disease, unspecified: Secondary | ICD-10-CM | POA: Diagnosis not present

## 2017-10-08 DIAGNOSIS — Z95828 Presence of other vascular implants and grafts: Secondary | ICD-10-CM | POA: Diagnosis not present

## 2017-10-08 DIAGNOSIS — Z7951 Long term (current) use of inhaled steroids: Secondary | ICD-10-CM | POA: Diagnosis not present

## 2017-10-08 DIAGNOSIS — I255 Ischemic cardiomyopathy: Secondary | ICD-10-CM | POA: Diagnosis not present

## 2017-10-08 DIAGNOSIS — Z7982 Long term (current) use of aspirin: Secondary | ICD-10-CM | POA: Insufficient documentation

## 2017-10-08 DIAGNOSIS — I130 Hypertensive heart and chronic kidney disease with heart failure and stage 1 through stage 4 chronic kidney disease, or unspecified chronic kidney disease: Secondary | ICD-10-CM | POA: Diagnosis not present

## 2017-10-08 DIAGNOSIS — Z8679 Personal history of other diseases of the circulatory system: Secondary | ICD-10-CM | POA: Insufficient documentation

## 2017-10-08 DIAGNOSIS — Z951 Presence of aortocoronary bypass graft: Secondary | ICD-10-CM | POA: Insufficient documentation

## 2017-10-08 DIAGNOSIS — Z9221 Personal history of antineoplastic chemotherapy: Secondary | ICD-10-CM | POA: Insufficient documentation

## 2017-10-08 DIAGNOSIS — I5042 Chronic combined systolic (congestive) and diastolic (congestive) heart failure: Secondary | ICD-10-CM

## 2017-10-08 DIAGNOSIS — N182 Chronic kidney disease, stage 2 (mild): Secondary | ICD-10-CM | POA: Insufficient documentation

## 2017-10-08 DIAGNOSIS — I4510 Unspecified right bundle-branch block: Secondary | ICD-10-CM | POA: Diagnosis not present

## 2017-10-08 DIAGNOSIS — Z7902 Long term (current) use of antithrombotics/antiplatelets: Secondary | ICD-10-CM | POA: Insufficient documentation

## 2017-10-08 DIAGNOSIS — I25810 Atherosclerosis of coronary artery bypass graft(s) without angina pectoris: Secondary | ICD-10-CM | POA: Insufficient documentation

## 2017-10-08 DIAGNOSIS — I2510 Atherosclerotic heart disease of native coronary artery without angina pectoris: Secondary | ICD-10-CM

## 2017-10-08 DIAGNOSIS — Z923 Personal history of irradiation: Secondary | ICD-10-CM | POA: Diagnosis not present

## 2017-10-08 DIAGNOSIS — E785 Hyperlipidemia, unspecified: Secondary | ICD-10-CM | POA: Insufficient documentation

## 2017-10-08 DIAGNOSIS — C3490 Malignant neoplasm of unspecified part of unspecified bronchus or lung: Secondary | ICD-10-CM | POA: Insufficient documentation

## 2017-10-08 DIAGNOSIS — Z8261 Family history of arthritis: Secondary | ICD-10-CM | POA: Insufficient documentation

## 2017-10-08 LAB — BASIC METABOLIC PANEL
ANION GAP: 11 (ref 5–15)
BUN: 19 mg/dL (ref 6–20)
CHLORIDE: 96 mmol/L — AB (ref 101–111)
CO2: 27 mmol/L (ref 22–32)
Calcium: 9.5 mg/dL (ref 8.9–10.3)
Creatinine, Ser: 1.42 mg/dL — ABNORMAL HIGH (ref 0.61–1.24)
GFR, EST AFRICAN AMERICAN: 53 mL/min — AB (ref >60)
GFR, EST NON AFRICAN AMERICAN: 46 mL/min — AB (ref >60)
Glucose, Bld: 112 mg/dL — ABNORMAL HIGH (ref 65–99)
POTASSIUM: 5 mmol/L (ref 3.5–5.1)
SODIUM: 134 mmol/L — AB (ref 135–145)

## 2017-10-08 MED ORDER — SACUBITRIL-VALSARTAN 49-51 MG PO TABS: 1.0000 | ORAL_TABLET | Freq: Two times a day (BID) | ORAL | 3 refills | Status: DC

## 2017-10-08 MED ORDER — FUROSEMIDE 20 MG PO TABS: 20.0000 mg | ORAL_TABLET | Freq: Two times a day (BID) | ORAL | 3 refills | Status: DC

## 2017-10-08 NOTE — Progress Notes (Signed)
PCP: Dr. Wolfgang Phoenix Pulmonary: Dr. Lenna Gilford HF Cardiology: Dr. Aundra Dubin  77 yo with history of COPD, CAD s/p CABG in 1996 and PCI to dLM and SVG-D in 7/15, ischemic cardiomyopathy, and non-small cell lung cancer presents for followup of CHF and dyspnea.  He was initially referred to [place] by Dr. Lenna Gilford after fall in EF and worsening dyspnea was noted.   Patient reports that his initial symptoms prior to CABG in 1996 were dyspnea.  He has never had any worrisome chest pain.  He did well initially after CABG and was golfing regularly and walking 2-3 miles/day up until around 3/17.  He had one episode of increased dyspnea in 2015 and had cath with PCI to distal LM and SVG-D.  In 3/17, he was diagnosed with NSCLC and was treated with radiation and chemotherapy.  Since then, he has had progressive exertional dyspnea.  He was noted on echo in 7/18 to have EF down to 20-25%.    RHC/LHC was done in 9/18, showing occluded SVG-small D, patent LIMA-LAD and SVG-PDA, 50% shelf-like stenosis distal left main (no intervention), filling pressure not elevated and cardiac output preserved.   He is stable symptomatically.  He continues to walk around [place] with his wife without dyspnea.  No dyspnea generally walking on flat ground.  He gets short of breath going up stairs.  No orthopnea/PND. No chest pain. No palpitations.  He had PNA in 1/19.  Weight is stable.     Labs (9/17): LDL 90 Labs (7/18): K 3.5, creatinine 1.28, hgb 10.7 Labs (8/18): LDL 68 Labs (9/18): K 4.1, creatinine 1.2, hgb 11.1, plts 98 K Labs (10/18): K 4.1, creatinine 1.5 Labs (1/19): K 4.3, creatinine 1.54, LDL 80, HDL 49, LFTs normal, TSH normal  PMH: 1. COPD: PFTs (6/17) with severe obstructive airways disease.  2. AAA: s/p repair with stent graft.  3. Non-small cell lung cancer: Stage IIIA, diagnosed 3/17.  He had radiation as well as chemotherapy with carboplatin and paclitaxel.   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 PNA in 6/17 6. Recurrent right pleural  effusion: VATS with talc pleurodesis on right.  No malignant cells noted.  7. HTN 8. H/o THR 9. CAD: CABG 1996 with SVG-D, SVG-PDA, LIMA-LAD.   - LHC (7/15): Totally occluded RCA and LAD.  Severe distal LM stenosis.  Patent SVG-PDA, patent LIMA-LAD.  Severe disease SVG-D.  Patient had DES to distal left main and staged PCI of SVG-D.   - Cardiolite (7/17): EF 30%, prior infarction with no ischemia.  - LHC (9/18): occluded SVG-small D, patent LIMA-LAD and SVG-PDA, 50% shelf-like stenosis distal left main (no intervention).  10. Chronic systolic CHF: Ischemic cardiomyopathy.   - Echo (6/17): EF 30-35%. - Echo (7/18): EF 20-25%, moderate RV dilation with mildly decreased RV systolic function.  - RHC (9/18): mean RA 3, PA 28/9 mean 18, mean PCWP 10, CI 4.05.   Social History   Socioeconomic History  . Marital status: Married    Spouse name: Not on file  . Number of children: 1  . Years of education: Not on file  . Highest education level: Not on file  Social Needs  . Financial resource strain: Not on file  . Food insecurity - worry: Not on file  . Food insecurity - inability: Not on file  . Transportation needs - medical: Not on file  . Transportation needs - non-medical: Not on file  Occupational History  . Occupation: Retired    Comment: [employer]  .  Smoking status: Former Smoker    Packs/day: 1.50    Years: 30.00    Pack years: 45.00    Types: Cigarettes    Start date: 12/01/1956    Last attempt to quit: 01/08/1995    Years since quitting: 22.7  . Smokeless tobacco: Never Used  Substance and Sexual Activity  . Alcohol use: No    Alcohol/week: 0.0 oz    Frequency: Never    Comment: 03/18/2014 "no alacohol since 1996"  . Drug use: No  . Sexual activity: No  Other Topics Concern  . Not on file  Social History Narrative  . Not on file   Family History  Problem Relation Age of Onset  . Heart disease Father   . Cancer Father        Lung  .  Arthritis Mother   . Parkinsonism Mother   . Arthritis Sister        Brother with rheumatoid arthritis  . Hypertension Brother   . Colon cancer Neg Hx   . Colon polyps Neg Hx    ROS: All systems reviewed and negative except as per HPI.   Current Outpatient Medications  Medication Sig Dispense Refill  . acetaminophen (TYLENOL) 500 MG tablet Take 500 mg by mouth every 6 (six) hours as needed for mild pain.    Marland Kitchen aspirin EC 81 MG tablet Take 81 mg by mouth daily before breakfast.     . budesonide-formoterol (SYMBICORT) 160-4.5 MCG/ACT inhaler Inhale 2 puffs into the lungs 2 (two) times daily.    . carvedilol (COREG) 6.25 MG tablet Take 1.5 tablets (9.375 mg total) by mouth 2 (two) times daily with a meal. 90 tablet 3  . clopidogrel (PLAVIX) 75 MG tablet TAKE ONE TABLET BY MOUTH DAILY. 90 tablet 2  . Ferrous Sulfate (IRON) 28 MG TABS Take 28 mg by mouth daily.    . furosemide (LASIX) 20 MG tablet Take 1 tablet (20 mg total) by mouth 2 (two) times daily. 60 tablet 3  . GuaiFENesin (MUCUS RELIEF ADULT PO) Take 2 capsules by mouth 3 (three) times daily.    Marland Kitchen ipratropium-albuterol (DUONEB) 0.5-2.5 (3) MG/3ML SOLN Take 3 mLs by nebulization 4 (four) times daily. 360 mL 5  . methylPREDNISolone (MEDROL) 8 MG tablet Take 1 tablet (8 mg total) by mouth daily. Start w/ one tab twice daily for 1 week...    Then decrease to one tab each AM for 1 week...    Then decrease to 1/2 tab daily in the AM for 1 week...    Then decrease to 1/2 tab every other day til return visit (1/2, 0, 1/2, 0, etc)... 50 tablet 0  . Multiple Vitamins-Minerals (CENTRUM SILVER ADULT 50+) TABS Take 1 tablet by mouth daily with lunch.     Marland Kitchen NITROSTAT 0.4 MG SL tablet PLACE 1 TAB UNDER TONGUE EVERY 5 MIN IF NEEDED FOR CHEST PAIN. MAY USE 3 TIMES.NO RELIEF CALL 911. 25 tablet 1  . omeprazole (PRILOSEC) 20 MG capsule TAKE ONE CAPSULE BY MOUTH DAILY. 90 capsule 0  . Probiotic Product (FLORAJEN3 PO) Take 1 tablet by mouth daily.    .  simvastatin (ZOCOR) 80 MG tablet Take 0.5 tablets (40 mg total) by mouth at bedtime. 15 tablet 5  . spironolactone (ALDACTONE) 25 MG tablet Take 1 tablet (25 mg total) by mouth daily. 30 tablet 3  . VENTOLIN HFA 108 (90 Base) MCG/ACT inhaler INHALE 2 PUFFS BY MOUTH EVERY 4 TO 6 HOURS AS NEEDED FOR WHEEZING.  18 g 0  . sacubitril-valsartan (ENTRESTO) 49-51 MG Take 1 tablet by mouth 2 (two) times daily. 60 tablet 3   No current facility-administered medications for this encounter.    BP 126/68   Pulse 91   Wt 185 lb 1.9 oz (84 kg)   SpO2 95%   BMI 26.56 kg/m  General: NAD Neck: No JVD, no thyromegaly or thyroid nodule.  Lungs: Clear to auscultation bilaterally with normal respiratory effort. CV: Nondisplaced PMI.  Heart regular S1/S2, no S3/S4, no murmur.  No peripheral edema.  No carotid bruit.  Normal pedal pulses.  Abdomen: Soft, nontender, no hepatosplenomegaly, no distention.  Skin: Intact without lesions or rashes.  Neurologic: Alert and oriented x 3.  Psych: Normal affect. Extremities: No clubbing or cyanosis.  HEENT: Normal.   Assessment/Plan: 1. Chronic systolic CHF: Ischemic cardiomyopathy.  He has had a steady fall in EF on imaging studies, most recently had echo in 7/18 with EF 20-25%.  Currently, NYHA class II symptoms.  He is not volume overloaded on exam.  I suspect a significant portion of his dyspnea is due to COPD.   - Continue Coreg 9.375 mg bid.  - Increase Entresto to 49/51 bid.  BMET today and again in 2 weeks.  - With increase in Entresto, can decrease Lasix to 20 mg daily.  - Continue spironolactone 25 mg daily.  - Repeat echo soon, will be ICD candidate if EF remains low. He has a RBBB so not good CRT candidate.  2. CAD: s/p CABG then PCI 7/15 to distal LM and SVG-D.  Anginal equivalent in the past appears to have been dyspnea, he has never had significant chest pain.  LHC (9/18) showed patent LIMA-LAD and SVG-PDA; occluded SVG-D; 50% shelf-like LM stenosis -  Continue ASA 81, Plavix, and simvastatin.  3. COPD: Severe COPD by PFTs in 6/17.  Based on the recent RHC/LHC, I suspect that COPD plays a major role in his ongoing dyspnea.   4. Hyperlipidemia: Lipids acceptable in 1/19.  5. NSCLC: Follows with Dr. Earlie Server.  He has finished chemo/radiation and will get scans to assess for residual disease. He does not appear to have had any significantly cardiotoxic agents with chemotherapy.  6. Exertional dyspnea: With normal filling pressures on RHC, I think that COPD plays a major role in his dyspnea.  7. Right pleural effusion: This has been chronic. S/p VATS.   Followup in 3 months.   Loralie Champagne 10/08/2017

## 2017-10-08 NOTE — Progress Notes (Signed)
error 

## 2017-10-08 NOTE — Progress Notes (Signed)
Medication Samples have been provided to the patient.  Drug name: Delene Loll     Strength: 24-26mg     Qty: 2  LOT: [id number] Exp.Date: 06/21  Dosing instructions: Take 1 Tablet twice Daily  The patient has been instructed regarding the correct time, dose, and frequency of taking this medication, including desired effects and most common side effects.   Darron Doom 12:05 PM 10/08/2017

## 2017-10-08 NOTE — Patient Instructions (Signed)
Increase Entresto 49/51 (1 tab), twice a day  Decrease Furosemide 20 mg (1 tab), twice a day  Labs drawn today (if we do not call you, then your lab work was stable)   Your physician recommends that you return for lab work in: 2 weeks with Dr. Aundra Dubin    Your physician recommends that you schedule a follow-up appointment in: 3 months with Dr. Aundra Dubin

## 2017-10-15 DIAGNOSIS — H5203 Hypermetropia, bilateral: Secondary | ICD-10-CM | POA: Diagnosis not present

## 2017-10-15 DIAGNOSIS — H25013 Cortical age-related cataract, bilateral: Secondary | ICD-10-CM | POA: Diagnosis not present

## 2017-10-15 DIAGNOSIS — H524 Presbyopia: Secondary | ICD-10-CM | POA: Diagnosis not present

## 2017-10-15 DIAGNOSIS — H52203 Unspecified astigmatism, bilateral: Secondary | ICD-10-CM | POA: Diagnosis not present

## 2017-10-15 DIAGNOSIS — H2513 Age-related nuclear cataract, bilateral: Secondary | ICD-10-CM | POA: Diagnosis not present

## 2017-10-18 DIAGNOSIS — J449 Chronic obstructive pulmonary disease, unspecified: Secondary | ICD-10-CM | POA: Diagnosis not present

## 2017-10-21 ENCOUNTER — Ambulatory Visit: Payer: Medicare Other | Admitting: Family Medicine

## 2017-10-21 ENCOUNTER — Encounter: Payer: Self-pay | Admitting: Family Medicine

## 2017-10-21 VITALS — BP 122/80 | Temp 98.1°F | Ht 70.0 in | Wt 190.0 lb

## 2017-10-21 DIAGNOSIS — J329 Chronic sinusitis, unspecified: Secondary | ICD-10-CM

## 2017-10-21 MED ORDER — AMOXICILLIN 500 MG PO CAPS: 500.0000 mg | ORAL_CAPSULE | Freq: Three times a day (TID) | ORAL | 0 refills | Status: DC

## 2017-10-21 NOTE — Progress Notes (Signed)
   Subjective:    Patient ID: KHAI ARRONA, male    DOB: 06/23/1941, 77 y.o.   MRN: [id number]  Cough  This is a new problem. The current episode started in the past 7 days. Associated symptoms include nasal congestion. He has tried OTC cough suppressant (tylenol) for the symptoms.    Bad cough and cong  Mostly up top   Pos headache frontal , pos secretions  h a worse with cough, sharp at times  Review of Systems  Respiratory: Positive for cough.   No headache, no major weight loss or weight gain, no chest pain no back pain abdominal pain no change in bowel habits complete ROS otherwise negative      Objective:   Physical Exam  Alert, mild malaise. Hydration good Vitals stable. frontal/ maxillary tenderness evident positive nasal congestion. pharynx normal neck supple  lungs clear/no crackles with rare wheezes. heart regular in rhythm       Assessment & Plan:  Impression rhinosinusitis likely post viral, discussed with patient. plan antibiotics prescribed. Questions answered. Symptomatic care discussed. warning signs discussed. WSL Feel free to use albuterol 4 times per day with rare

## 2017-10-22 ENCOUNTER — Ambulatory Visit (HOSPITAL_COMMUNITY)
Admission: RE | Admit: 2017-10-22 | Discharge: 2017-10-22 | Disposition: A | Discharge status: Home / Self Care | Payer: Medicare Other | Source: Ambulatory Visit | Attending: Cardiology | Admitting: Cardiology

## 2017-10-22 ENCOUNTER — Other Ambulatory Visit (HOSPITAL_COMMUNITY): Payer: Self-pay | Admitting: *Deleted

## 2017-10-22 DIAGNOSIS — I5042 Chronic combined systolic (congestive) and diastolic (congestive) heart failure: Secondary | ICD-10-CM | POA: Insufficient documentation

## 2017-10-22 LAB — BASIC METABOLIC PANEL
Anion gap: 9 (ref 5–15)
BUN: 14 mg/dL (ref 6–20)
CALCIUM: 8.7 mg/dL — AB (ref 8.9–10.3)
CHLORIDE: 102 mmol/L (ref 101–111)
CO2: 26 mmol/L (ref 22–32)
CREATININE: 1.42 mg/dL — AB (ref 0.61–1.24)
GFR calc non Af Amer: 46 mL/min — ABNORMAL LOW (ref >60)
GFR, EST AFRICAN AMERICAN: 53 mL/min — AB (ref >60)
GLUCOSE: 100 mg/dL — AB (ref 65–99)
Potassium: 4 mmol/L (ref 3.5–5.1)
Sodium: 137 mmol/L (ref 135–145)

## 2017-10-29 ENCOUNTER — Encounter: Payer: Self-pay | Admitting: Internal Medicine

## 2017-10-30 ENCOUNTER — Telehealth: Payer: Self-pay | Admitting: Pulmonary Disease

## 2017-10-30 MED ORDER — LEVOFLOXACIN 750 MG PO TABS: 750.0000 mg | ORAL_TABLET | Freq: Every day | ORAL | 0 refills | Status: DC

## 2017-10-30 NOTE — Telephone Encounter (Signed)
Called and spoke with patient, he states that he was seen by his family doctor last week and given an antibiotic. The medication was finished today and he states that he is still having dark yellow and green mucus. SN please advise if he needs an appointment or another antibiotic. Thanks.

## 2017-10-30 NOTE — Telephone Encounter (Signed)
Per SN: D/C the amoxicillin that was prescribed by Dr Wolfgang Phoenix and start taking Levaquin 750mg  1 po qd X7 days. --------------- Spoke with pt, aware of recs.  rx sent to preferred pharmacy.  Nothing further needed.

## 2017-11-04 DIAGNOSIS — J449 Chronic obstructive pulmonary disease, unspecified: Secondary | ICD-10-CM | POA: Diagnosis not present

## 2017-11-07 ENCOUNTER — Other Ambulatory Visit (HOSPITAL_COMMUNITY): Payer: Self-pay | Admitting: *Deleted

## 2017-11-07 ENCOUNTER — Telehealth (HOSPITAL_COMMUNITY): Payer: Self-pay | Admitting: *Deleted

## 2017-11-07 MED ORDER — SACUBITRIL-VALSARTAN 24-26 MG PO TABS: 1.0000 | ORAL_TABLET | Freq: Two times a day (BID) | ORAL | 3 refills | Status: DC

## 2017-11-07 NOTE — Telephone Encounter (Signed)
Pt called in c/o low blood pressure readings 90/50 and feeling more fatigued since we increased his entresto to 49/51 bid. Per Dr.McLean decrease Entresto to 24/26mg  bid and call if he does not see any improvement. Pt aware and agreeable with plan.

## 2017-11-15 DIAGNOSIS — J449 Chronic obstructive pulmonary disease, unspecified: Secondary | ICD-10-CM | POA: Diagnosis not present

## 2017-11-21 ENCOUNTER — Encounter (HOSPITAL_COMMUNITY): Payer: Self-pay

## 2017-11-21 ENCOUNTER — Ambulatory Visit (HOSPITAL_COMMUNITY)
Admission: RE | Admit: 2017-11-21 | Discharge: 2017-11-21 | Disposition: A | Discharge status: Home / Self Care | Payer: Medicare Other | Source: Ambulatory Visit | Attending: Internal Medicine | Admitting: Internal Medicine

## 2017-11-21 DIAGNOSIS — C3411 Malignant neoplasm of upper lobe, right bronchus or lung: Secondary | ICD-10-CM | POA: Insufficient documentation

## 2017-11-21 DIAGNOSIS — Z923 Personal history of irradiation: Secondary | ICD-10-CM | POA: Diagnosis not present

## 2017-11-21 DIAGNOSIS — J90 Pleural effusion, not elsewhere classified: Secondary | ICD-10-CM | POA: Insufficient documentation

## 2017-11-21 DIAGNOSIS — R590 Localized enlarged lymph nodes: Secondary | ICD-10-CM | POA: Insufficient documentation

## 2017-11-21 DIAGNOSIS — C3491 Malignant neoplasm of unspecified part of right bronchus or lung: Secondary | ICD-10-CM | POA: Diagnosis not present

## 2017-11-21 DIAGNOSIS — M4854XA Collapsed vertebra, not elsewhere classified, thoracic region, initial encounter for fracture: Secondary | ICD-10-CM | POA: Insufficient documentation

## 2017-11-21 MED ORDER — IOPAMIDOL (ISOVUE-300) INJECTION 61%
75.0000 mL | Freq: Once | INTRAVENOUS | Status: AC | PRN
  Administered 2017-11-21: 75 mL via INTRAVENOUS

## 2017-11-21 MED ORDER — IOPAMIDOL (ISOVUE-300) INJECTION 61%
INTRAVENOUS | Status: AC
  Filled 2017-11-21: qty 75

## 2017-11-25 ENCOUNTER — Other Ambulatory Visit: Payer: Self-pay | Admitting: Cardiovascular Disease

## 2017-11-25 ENCOUNTER — Telehealth: Payer: Self-pay | Admitting: Pulmonary Disease

## 2017-11-25 DIAGNOSIS — C3411 Malignant neoplasm of upper lobe, right bronchus or lung: Secondary | ICD-10-CM

## 2017-11-25 DIAGNOSIS — J449 Chronic obstructive pulmonary disease, unspecified: Secondary | ICD-10-CM | POA: Diagnosis not present

## 2017-11-25 MED ORDER — METHYLPREDNISOLONE 4 MG PO TABS: 4.0000 mg | ORAL_TABLET | ORAL | 1 refills | Status: DC

## 2017-11-25 NOTE — Telephone Encounter (Signed)
lmtcb x1 for pt. 

## 2017-11-25 NOTE — Telephone Encounter (Signed)
Patient returning call, CB is [phone].

## 2017-11-25 NOTE — Telephone Encounter (Signed)
Spoke with pt. Per SN's documentation, pt will be taking Medrol 4mg  every other day until his next OV. Rx has been sent in. Nothing further was needed.

## 2017-11-25 NOTE — Telephone Encounter (Signed)
Pt is calling back [phone]

## 2017-11-26 ENCOUNTER — Inpatient Hospital Stay: Payer: Medicare Other | Attending: Internal Medicine

## 2017-11-26 DIAGNOSIS — Z79899 Other long term (current) drug therapy: Secondary | ICD-10-CM | POA: Insufficient documentation

## 2017-11-26 DIAGNOSIS — Z7982 Long term (current) use of aspirin: Secondary | ICD-10-CM | POA: Insufficient documentation

## 2017-11-26 DIAGNOSIS — C3411 Malignant neoplasm of upper lobe, right bronchus or lung: Secondary | ICD-10-CM | POA: Insufficient documentation

## 2017-11-26 DIAGNOSIS — D649 Anemia, unspecified: Secondary | ICD-10-CM | POA: Insufficient documentation

## 2017-11-26 LAB — COMPREHENSIVE METABOLIC PANEL
ALBUMIN: 3.5 g/dL (ref 3.5–5.0)
ALK PHOS: 68 U/L (ref 40–150)
ALT: 14 U/L (ref 0–55)
AST: 18 U/L (ref 5–34)
Anion gap: 7 (ref 3–11)
BILIRUBIN TOTAL: 0.4 mg/dL (ref 0.2–1.2)
BUN: 16 mg/dL (ref 7–26)
CALCIUM: 9.3 mg/dL (ref 8.4–10.4)
CO2: 28 mmol/L (ref 22–29)
CREATININE: 1.52 mg/dL — AB (ref 0.70–1.30)
Chloride: 101 mmol/L (ref 98–109)
GFR calc Af Amer: 49 mL/min — ABNORMAL LOW (ref >60)
GFR, EST NON AFRICAN AMERICAN: 42 mL/min — AB (ref >60)
GLUCOSE: 82 mg/dL (ref 70–140)
Potassium: 4.3 mmol/L (ref 3.5–5.1)
Sodium: 136 mmol/L (ref 136–145)
TOTAL PROTEIN: 7 g/dL (ref 6.4–8.3)

## 2017-11-26 LAB — CBC WITH DIFFERENTIAL/PLATELET
BASOS ABS: 0 10*3/uL (ref 0.0–0.1)
BASOS PCT: 0 %
EOS ABS: 0.1 10*3/uL (ref 0.0–0.5)
EOS PCT: 1 %
HCT: 36.1 % — ABNORMAL LOW (ref 38.4–49.9)
Hemoglobin: 11.8 g/dL — ABNORMAL LOW (ref 13.0–17.1)
Lymphocytes Relative: 8 %
Lymphs Abs: 0.8 10*3/uL — ABNORMAL LOW (ref 0.9–3.3)
MCH: 30.2 pg (ref 27.2–33.4)
MCHC: 32.8 g/dL (ref 32.0–36.0)
MCV: 92.1 fL (ref 79.3–98.0)
MONO ABS: 0.9 10*3/uL (ref 0.1–0.9)
Monocytes Relative: 9 %
Neutro Abs: 8.4 10*3/uL — ABNORMAL HIGH (ref 1.5–6.5)
Neutrophils Relative %: 82 %
Platelets: 137 10*3/uL — ABNORMAL LOW (ref 140–400)
RBC: 3.91 MIL/uL — AB (ref 4.20–5.82)
RDW: 15.9 % — AB (ref 11.0–14.6)
WBC: 10.2 10*3/uL (ref 4.0–10.3)

## 2017-12-03 ENCOUNTER — Telehealth: Payer: Self-pay | Admitting: Internal Medicine

## 2017-12-03 ENCOUNTER — Encounter: Payer: Self-pay | Admitting: Internal Medicine

## 2017-12-03 ENCOUNTER — Inpatient Hospital Stay (HOSPITAL_BASED_OUTPATIENT_CLINIC_OR_DEPARTMENT_OTHER): Payer: Medicare Other | Admitting: Internal Medicine

## 2017-12-03 DIAGNOSIS — C3490 Malignant neoplasm of unspecified part of unspecified bronchus or lung: Secondary | ICD-10-CM

## 2017-12-03 DIAGNOSIS — C3411 Malignant neoplasm of upper lobe, right bronchus or lung: Secondary | ICD-10-CM | POA: Diagnosis not present

## 2017-12-03 DIAGNOSIS — Z79899 Other long term (current) drug therapy: Secondary | ICD-10-CM

## 2017-12-03 DIAGNOSIS — D649 Anemia, unspecified: Secondary | ICD-10-CM | POA: Diagnosis not present

## 2017-12-03 DIAGNOSIS — Z7982 Long term (current) use of aspirin: Secondary | ICD-10-CM | POA: Diagnosis not present

## 2017-12-03 NOTE — Telephone Encounter (Signed)
Appt scheduled AVS/Calendar printed , radiology phone number given per 4/9 los

## 2017-12-03 NOTE — Progress Notes (Signed)
[facility] Telephone:([phone]) [phone]   Fax:([phone]) [phone]  OFFICE PROGRESS NOTE  [name], MD [address] [place] [address]  DIAGNOSIS: Stage IIIA (T2b, N2, M0) non-small cell lung cancer, favoring squamous cell carcinoma presented with right upper lobe lung mass in addition to mediastinal lymphadenopathy diagnosed in March 2017.  PRIOR THERAPY:  A course of concurrent chemoradiation with weekly carboplatin for AUC of 2 and paclitaxel 45 MG/M2. Status post 5 cycle with partial response.  CURRENT THERAPY: Observation  INTERVAL HISTORY: Paul Sandoval 77 y.o. male returns to the clinic today for follow-up visit.  The patient is feeling fine today with no specific complaints.  He had few episodes of bronchitis in the last 6 months and treated with several courses of antibiotics.  He denied having any current chest pain, shortness breath, cough or hemoptysis.  He denied having any weight loss or night sweats.  He has no nausea, vomiting, diarrhea or constipation.  He had repeat CT scan of the chest performed recently and he is here for evaluation and discussion of his discuss results.  MEDICAL HISTORY: Past Medical History:  Diagnosis Date  . AAA (abdominal aortic aneurysm) ([place]) 2010   4.4 cm 08/2008;4.44 in 7/10 and 4.65 in 08/2009; 4.8 by CT in 11/2009; 4.3 by ultrasound in 08/2010  . Anemia   . Arteriosclerotic cardiovascular disease (ASCVD) 1996   CABG-1996  . Arthritis    "fingers" (03/18/2014)  . CAD (coronary artery disease)    03/18/14:  PCI with DES to distal left main. 7/29: DES to the SVG to Diag  . Cancer ([place])    Upper right lobe lung cancer  . Cardiomyopathy, ischemic    Echo 03/17/14: EF 45-50%  . Chronic bronchitis ([place])   . Chronic kidney disease    CRF  . Chronic rhinitis   . Colonic polyp 2002   polypectomy in 2002  . COPD (chronic obstructive pulmonary disease) ([place])   . Diverticulosis   . Dyspnea    with exertion  .  ED (erectile dysfunction)   . Encounter for antineoplastic chemotherapy 12/19/2015  . GERD (gastroesophageal reflux disease)   . History of blood transfusion   . Hyperlipidemia   . Hypertension   . IFG (impaired fasting glucose)   . Myocardial infarction [facility])    "told h/o silent MI sometime before 1996"  . Pneumonia ~ 2001; ~ 2005    has had more than twice  . Right bundle branch block   . Tobacco abuse, in remission    40 pack year total consumption; discontinued in 1996    ALLERGIES:  is allergic to neomycin.  MEDICATIONS:  Current Outpatient Medications  Medication Sig Dispense Refill  . acetaminophen (TYLENOL) 500 MG tablet Take 500 mg by mouth every 6 (six) hours as needed for mild pain.    Marland Kitchen aspirin EC 81 MG tablet Take 81 mg by mouth daily before breakfast.     . budesonide-formoterol (SYMBICORT) 160-4.5 MCG/ACT inhaler Inhale 2 puffs into the lungs 2 (two) times daily.    . carvedilol (COREG) 6.25 MG tablet Take 1.5 tablets (9.375 mg total) by mouth 2 (two) times daily with a meal. 90 tablet 3  . clopidogrel (PLAVIX) 75 MG tablet TAKE ONE TABLET BY MOUTH DAILY. 30 tablet 0  . Ferrous Sulfate (IRON) 28 MG TABS Take 28 mg by mouth daily.    . furosemide (LASIX) 20 MG tablet Take 1 tablet (20 mg  total) by mouth 2 (two) times daily. 60 tablet 3  . GuaiFENesin (MUCUS RELIEF ADULT PO) Take 2 capsules by mouth 3 (three) times daily.    Marland Kitchen ipratropium-albuterol (DUONEB) 0.5-2.5 (3) MG/3ML SOLN Take 3 mLs by nebulization 4 (four) times daily. 360 mL 5  . methylPREDNISolone (MEDROL) 4 MG tablet Take 1 tablet (4 mg total) by mouth every other day. 15 tablet 1  . Multiple Vitamins-Minerals (CENTRUM SILVER ADULT 50+) TABS Take 1 tablet by mouth daily with lunch.     Marland Kitchen NITROSTAT 0.4 MG SL tablet PLACE 1 TAB UNDER TONGUE EVERY 5 MIN IF NEEDED FOR CHEST PAIN. MAY USE 3 TIMES.NO RELIEF CALL 911. 25 tablet 1  . omeprazole (PRILOSEC) 20 MG capsule TAKE ONE CAPSULE BY MOUTH DAILY. 90 capsule 0    . Probiotic Product (FLORAJEN3 PO) Take 1 tablet by mouth daily.    . sacubitril-valsartan (ENTRESTO) 24-26 MG Take 1 tablet by mouth 2 (two) times daily. 60 tablet 3  . simvastatin (ZOCOR) 80 MG tablet Take 0.5 tablets (40 mg total) by mouth at bedtime. 15 tablet 5  . VENTOLIN HFA 108 (90 Base) MCG/ACT inhaler INHALE 2 PUFFS BY MOUTH EVERY 4 TO 6 HOURS AS NEEDED FOR WHEEZING. 18 g 0  . amoxicillin (AMOXIL) 500 MG capsule Take 1 capsule (500 mg total) by mouth 3 (three) times daily. (Patient not taking: Reported on 12/03/2017) 30 capsule 0  . levofloxacin (LEVAQUIN) 750 MG tablet Take 1 tablet (750 mg total) by mouth daily. (Patient not taking: Reported on 12/03/2017) 7 tablet 0  . spironolactone (ALDACTONE) 25 MG tablet Take 1 tablet (25 mg total) by mouth daily. 30 tablet 3   No current facility-administered medications for this visit.     SURGICAL HISTORY:  Past Surgical History:  Procedure Laterality Date  . ABDOMINAL AORTIC ANEURYSM REPAIR  11/2012  . ABDOMINAL AORTIC ENDOVASCULAR STENT GRAFT N/A 12/11/2012   Procedure: ABDOMINAL AORTIC ENDOVASCULAR STENT GRAFT;  Surgeon: Mal Misty, MD;  Location: [facility];  Service: Vascular;  Laterality: N/A;  Ultrasound guided; [place]  . CARDIAC CATHETERIZATION  01/08/1995  . COLONOSCOPY  2002   polypectomy-patient denies  . CORONARY ANGIOPLASTY WITH STENT PLACEMENT  03/18/2014   "1"  . CORONARY ANGIOPLASTY WITH STENT PLACEMENT  03/24/2014   "1"  . CORONARY ARTERY BYPASS GRAFT  01/09/1995   "CABG X3"  . FLEXIBLE BRONCHOSCOPY N/A 03/01/2017   Procedure: FLEXIBLE BRONCHOSCOPY WITH BIOPSIES;  Surgeon: Gaye Pollack, MD;  Location: [facility] OR;  Service: Thoracic;  Laterality: N/A;  . JOINT REPLACEMENT    . LAPAROSCOPIC CHOLECYSTECTOMY  12/2009  . LEFT AND RIGHT HEART CATHETERIZATION WITH CORONARY/GRAFT ANGIOGRAM N/A 03/18/2014   Procedure: LEFT AND RIGHT HEART CATHETERIZATION WITH Beatrix Fetters;  Surgeon: Blane Ohara, MD;  Location: [facility] CATH LAB;   Service: Cardiovascular;  Laterality: N/A;  . PERCUTANEOUS CORONARY STENT INTERVENTION (PCI-S)  03/18/2014   Procedure: PERCUTANEOUS CORONARY STENT INTERVENTION (PCI-S);  Surgeon: Blane Ohara, MD;  Location: [facility] CATH LAB;  Service: Cardiovascular;;  . PERCUTANEOUS CORONARY STENT INTERVENTION (PCI-S) N/A 03/24/2014   Procedure: PERCUTANEOUS CORONARY STENT INTERVENTION (PCI-S);  Surgeon: Blane Ohara, MD;  Location: [facility] CATH LAB;  Service: Cardiovascular;  Laterality: N/A;  . PLEURAL EFFUSION DRAINAGE Right 03/01/2017   Procedure: DRAINAGE OF PLEURAL EFFUSION;  Surgeon: Gaye Pollack, MD;  Location: [facility];  Service: Thoracic;  Laterality: Right;  . RIGHT/LEFT HEART CATH AND CORONARY/GRAFT ANGIOGRAPHY N/A 05/15/2017   Procedure: RIGHT/LEFT HEART CATH AND  CORONARY/GRAFT ANGIOGRAPHY;  Surgeon: Larey Dresser, MD;  Location: [facility] CV LAB;  Service: Cardiovascular;  Laterality: N/A;  . TALC PLEURODESIS Right 03/01/2017   Procedure: Pietro Cassis;  Surgeon: Gaye Pollack, MD;  Location: [facility];  Service: Thoracic;  Laterality: Right;  . TOTAL HIP ARTHROPLASTY Left 01/21/2013   Procedure: TOTAL HIP ARTHROPLASTY ANTERIOR APPROACH;  Surgeon: Mauri Pole, MD;  Location: [facility];  Service: Orthopedics;  Laterality: Left;  Marland Kitchen VIDEO ASSISTED THORACOSCOPY Right 03/01/2017   Procedure: VIDEO ASSISTED THORACOSCOPY WITH BIOPSIES;  Surgeon: Gaye Pollack, MD;  Location: [facility];  Service: Thoracic;  Laterality: Right;  Marland Kitchen VIDEO BRONCHOSCOPY N/A 11/17/2015   Procedure: VIDEO BRONCHOSCOPY WITH FLUORO;  Surgeon: Rigoberto Noel, MD;  Location: [facility];  Service: Cardiopulmonary;  Laterality: N/A;    REVIEW OF SYSTEMS:  A comprehensive review of systems was negative.   PHYSICAL EXAMINATION: General appearance: alert, cooperative, fatigued and no distress Head: Normocephalic, without obvious abnormality, atraumatic Neck: no adenopathy, no JVD, supple, symmetrical, trachea midline and thyroid not enlarged,  symmetric, no tenderness/mass/nodules Lymph nodes: Cervical, supraclavicular, and axillary nodes normal. Resp: wheezes RLL Back: symmetric, no curvature. ROM normal. No CVA tenderness. Cardio: regular rate and rhythm, S1, S2 normal, no murmur, click, rub or gallop GI: soft, non-tender; bowel sounds normal; no masses,  no organomegaly Extremities: extremities normal, atraumatic, no cyanosis or edema  ECOG PERFORMANCE STATUS: 1 - Symptomatic but completely ambulatory  Blood pressure (!) 108/58, pulse 88, temperature (!) 97.5 F (36.4 C), temperature source Oral, resp. rate (!) 8, height _0  (1.778 m), weight 187 lb 14.4 oz (85.2 kg), SpO2 98 %.  LABORATORY DATA: Lab Results  Component Value Date   WBC 10.2 11/26/2017   HGB 11.8 (L) 11/26/2017   HCT 36.1 (L) 11/26/2017   MCV 92.1 11/26/2017   PLT 137 (L) 11/26/2017      Chemistry      Component Value Date/Time   NA 136 11/26/2017 1001   NA 137 05/21/2017 0906   K 4.3 11/26/2017 1001   K 4.1 05/21/2017 0906   CL 101 11/26/2017 1001   CO2 28 11/26/2017 1001   CO2 29 05/21/2017 0906   BUN 16 11/26/2017 1001   BUN 14.6 05/21/2017 0906   CREATININE 1.52 (H) 11/26/2017 1001   CREATININE 1.2 05/21/2017 0906      Component Value Date/Time   CALCIUM 9.3 11/26/2017 1001   CALCIUM 9.4 05/21/2017 0906   ALKPHOS 68 11/26/2017 1001   ALKPHOS 86 05/21/2017 0906   AST 18 11/26/2017 1001   AST 19 05/21/2017 0906   ALT 14 11/26/2017 1001   ALT 14 05/21/2017 0906   BILITOT 0.4 11/26/2017 1001   BILITOT 0.57 05/21/2017 0906       RADIOGRAPHIC STUDIES: Ct Chest W Contrast  Result Date: 11/21/2017 CLINICAL DATA:  Follow-up lung cancer. Right lung cancer diagnosed 3/17. EXAM: CT CHEST WITH CONTRAST TECHNIQUE: Multidetector CT imaging of the chest was performed during intravenous contrast administration. CONTRAST:  81m ISOVUE-300 IOPAMIDOL (ISOVUE-300) INJECTION 61% COMPARISON:  05/21/2017 FINDINGS: Cardiovascular: Normal heart size.  There is no pericardial effusion identified. Previous median sternotomy CABG procedure. Aortic atherosclerosis. Mediastinum/Nodes: Normal appearance of the thyroid gland. The trachea appears patent and is midline. Normal appearance of the esophagus. The index subcarinal lymph node measures 1.1 cm, image 67/2. Previously 1.2 cm. No hilar adenopathy. No axillary or supraclavicular adenopathy. Lungs/Pleura: Mild changes of emphysema. Similar volume of loculated right pleural effusion overlying the  right base and right lung apex. High attenuation material within the basilar component is noted compatible with prior talc pleurodesis. Paramediastinal fibrosis, volume loss, and mass-like architectural distortion within the right middle lobe, right upper lobe and superior segment of right lower lobe are again noted compatible with changes due to external beam radiation. Fibrosis within the anteromedial left upper lobe is again noted and appears similar to previous exam. Calcified granuloma identified within the lingula. The previously described micro nodularity pattern has improved in the interval compatible with inflammatory or infectious process. Upper Abdomen: No acute abnormality.  Previous cholecystectomy. Musculoskeletal: Spondylosis within the thoracic spine. Stable appearance of T4 compression fracture. No new fractures identified. IMPRESSION: 1. Similar appearance of changes due to external beam radiation within the right lung and medial left upper lobe. No findings to suggest new or progressive disease. 2. Stable appearance of loculated right-sided pleural effusion status post talc pleurodesis. 3. Stable borderline subcarinal adenopathy. 4. Stable T4 compression fracture. Electronically Signed   By: Kerby Moors M.D.   On: 11/21/2017 11:50    ASSESSMENT AND PLAN:  This is a very pleasant 77 years old white male with a stage IIIa non-small cell lung cancer status post a course of concurrent chemoradiation with  weekly carboplatin and paclitaxel and he had a rough time with the treatment at that time. He did not receive consolidation chemotherapy. The patient is currently on observation and he is feeling fine.  Repeat CT scan of the chest showed no concerning findings for disease progression.  I discussed the scan results with the patient and recommended for him to continue in observation with repeat CT scan of the chest in 6 months. For the anemia, he was advised to increase his iron rich diet. He was advised to call immediately if he has any concerning symptoms in the interval. The patient voices understanding of current disease status and treatment options and is in agreement with the current care plan.  All questions were answered. The patient knows to call the clinic with any problems, questions or concerns. We can certainly see the patient much sooner if necessary. I spent 10 minutes counseling the patient face to face. The total time spent in the appointment was 15 minutes.  Disclaimer: This note was dictated with voice recognition software. Similar sounding words can inadvertently be transcribed and may not be corrected upon review.

## 2017-12-04 ENCOUNTER — Other Ambulatory Visit (HOSPITAL_COMMUNITY): Payer: Self-pay | Admitting: Cardiology

## 2017-12-06 ENCOUNTER — Ambulatory Visit: Payer: Medicare Other | Admitting: Family Medicine

## 2017-12-06 VITALS — BP 122/64 | Ht 70.0 in | Wt 189.0 lb

## 2017-12-06 DIAGNOSIS — J449 Chronic obstructive pulmonary disease, unspecified: Secondary | ICD-10-CM

## 2017-12-06 DIAGNOSIS — I10 Essential (primary) hypertension: Secondary | ICD-10-CM | POA: Diagnosis not present

## 2017-12-06 DIAGNOSIS — N183 Chronic kidney disease, stage 3 unspecified: Secondary | ICD-10-CM

## 2017-12-06 DIAGNOSIS — J441 Chronic obstructive pulmonary disease with (acute) exacerbation: Secondary | ICD-10-CM | POA: Diagnosis not present

## 2017-12-06 DIAGNOSIS — E7800 Pure hypercholesterolemia, unspecified: Secondary | ICD-10-CM | POA: Diagnosis not present

## 2017-12-06 NOTE — Progress Notes (Signed)
   Subjective:    Patient ID: Paul Sandoval, male    DOB: Dec 29, 1940, 77 y.o.   MRN: [id number]  Hypertension  This is a chronic problem. There are no compliance problems (takes meds every day, walking for exercise, eating better than what he was doing).    Pt states no concerns today.   Walking some , doing etter with improved breathing  Blood pressure medicine and blood pressure levels reviewed today with patient. Compliant with blood pressure medicine. States does not miss a dose. No obvious side effects. Blood pressure generally good when checked elsewhere. Watching salt intake.   Patient continues to take lipid medication regularly. No obvious side effects from it. Generally does not miss a dose. Prior blood work results are reviewed with patient. Patient continues to work on fat intake in diet  Continues to receive treatment and monitoring for lung cancer.  States overall chronic asthma/COPD symptoms are stable on current meds no recent use of albuterol.  Exercising a fair amount.  Still watching his diet.  Continues to see cardiologist also   Review of Systems No headache, no major weight loss or weight gain, no chest pain no back pain abdominal pain no change in bowel habits complete ROS otherwise negative     Objective:   Physical Exam  Alert and oriented, vitals reviewed and stable, NAD ENT-TM's and ext canals WNL bilat via otoscopic exam Soft palate, tonsils and post pharynx WNL via oropharyngeal exam Neck-symmetric, no masses; thyroid nonpalpable and nontender Pulmonary-no tachypnea or accessory muscle use; Clear without wheezes via auscultation Card--no abnrml murmurs, rhythm reg and rate WNL Carotid pulses symmetric, without bruits   Impression 1      Assessment & Plan:  Impression 1 hypertension.  Good control discussed maintain same meds rationale discussed  2.  Hyperlipidemia blood work reviewed in January.  Good control.  Discussed discussed  3.   Congestive heart failure clinically stable warning signs discussed  4.  COPD with reactive airway discussed compliance discussed exercise diet encourage medications refilled/ f u for wellness plus chronic six mo

## 2017-12-16 DIAGNOSIS — J449 Chronic obstructive pulmonary disease, unspecified: Secondary | ICD-10-CM | POA: Diagnosis not present

## 2017-12-19 ENCOUNTER — Ambulatory Visit: Payer: Medicare Other | Admitting: Pulmonary Disease

## 2017-12-19 ENCOUNTER — Encounter: Payer: Self-pay | Admitting: Pulmonary Disease

## 2017-12-19 VITALS — BP 108/62 | HR 78 | Temp 97.7°F | Ht 70.0 in | Wt 186.8 lb

## 2017-12-19 DIAGNOSIS — I2510 Atherosclerotic heart disease of native coronary artery without angina pectoris: Secondary | ICD-10-CM | POA: Diagnosis not present

## 2017-12-19 DIAGNOSIS — I10 Essential (primary) hypertension: Secondary | ICD-10-CM

## 2017-12-19 DIAGNOSIS — Z95828 Presence of other vascular implants and grafts: Secondary | ICD-10-CM

## 2017-12-19 DIAGNOSIS — Z951 Presence of aortocoronary bypass graft: Secondary | ICD-10-CM

## 2017-12-19 DIAGNOSIS — I679 Cerebrovascular disease, unspecified: Secondary | ICD-10-CM

## 2017-12-19 DIAGNOSIS — C3411 Malignant neoplasm of upper lobe, right bronchus or lung: Secondary | ICD-10-CM | POA: Diagnosis not present

## 2017-12-19 DIAGNOSIS — J90 Pleural effusion, not elsewhere classified: Secondary | ICD-10-CM

## 2017-12-19 DIAGNOSIS — N183 Chronic kidney disease, stage 3 unspecified: Secondary | ICD-10-CM

## 2017-12-19 DIAGNOSIS — I714 Abdominal aortic aneurysm, without rupture, unspecified: Secondary | ICD-10-CM

## 2017-12-19 DIAGNOSIS — I255 Ischemic cardiomyopathy: Secondary | ICD-10-CM

## 2017-12-19 DIAGNOSIS — J449 Chronic obstructive pulmonary disease, unspecified: Secondary | ICD-10-CM | POA: Diagnosis not present

## 2017-12-19 DIAGNOSIS — E785 Hyperlipidemia, unspecified: Secondary | ICD-10-CM

## 2017-12-19 DIAGNOSIS — I5042 Chronic combined systolic (congestive) and diastolic (congestive) heart failure: Secondary | ICD-10-CM

## 2017-12-19 NOTE — Patient Instructions (Signed)
Today we updated your med list in our EPIC system...    Continue your current medications the same...  Keep up the good work w/ diet & exercise...  We are "tickled pink" that you are doing so well!!!  Call for any questions...  Let's plan a follow up visit in 12mo, sooner if needed for problems.Marland KitchenMarland Kitchen

## 2017-12-19 NOTE — Progress Notes (Signed)
Subjective:     Patient ID: STARR ENGEL, male   DOB: 01-16-1941, 77 y.o.   MRN: [id number]  HPI  ~  February 29, 2016:  [place] w/ SN>  Maninder is a 77 y/o WF w/ mult medical problems as noted below who has seen DrAlva & DrMannam previously;  I have reviewed his extensive epic records and recent [place] x2 in June2017 and formulated the following PROBLEM LIST>>  His PCP is DrLuking in [place]... He reminded me that I cared for his dad in 38...    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    ~  [place]2 in 01/2016>  Fever, cough, increased RUL opac (PCT<0.10, he received 10d Levaquin) & right>left effusion w/ thoracentesis 02/22/16 removing 1200cc clear yellow fluid, prob transudate, & BNP was >900; meds adjusted & he was diuresed w/ improvement but 2DEcho revealed worsening CHF w/ EF ~30-35% + HK & AK => he has f/u pending w/ Cards...    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    ASPVD- s/p AAA stent graft 11/2012, mod bilat carotid occlusive dis (asymptomatic) w/ CDopplers showing ~50-60% bilat ICAstenoses>  [facility] yearly f/u CT Abd and CDopplers    MEDICAL issues>  HBP, HL, GERD/ Divertic/ Polyps, CKD-stage3, Anemia & thrombocytopenia>  On Simva80-1/2Qd, Prilosec20/d, Fe/ MVI/ etc... Since he was disch 02/25/16>  He reports feeling better, breathing better, still sl SOB w/ activ but not requiring O2, mild cough w/ beige sput, no hemoptysis, denies CP=> he is due for f/u CXR & blood work post hosp... EXAM shows Afeb, VSS, O2sat=95% on RA after walking back; Wt=180#;  HEENT- neg, mallampati2;  Chest- sl decr BS right base, few rhonchi, w/o w/r/consolidation;  Heart- RR Gr1/6SEM w/o r/g;  Abd- soft, neg;  Ext- neg w/o c/c/e...  PFT 02/01/16>  FVC=1.96 (45%), FEV1=1.25 (40%), %1sec=64, mid-flows were reduced at 28% predicted; post bronchodil there was a 31% improvement in FEV1 to 1.64;  TLC=5.96 (83%, RV=3.89 (  149%), RV/TLC=65%;  DLCO=58% pred.  This is c/w moderate to severe airflow obstruction, GOLD Stage 3 COPD w/ a signif asthmatic component, air trapping, and decr diffusion capacity...  2DEcho 02/22/16>  Mild conc LVH w/ decr LVF & EF=30-35% w/ diffuse HK & AK in several walls (see report), Gr2 DD, mild MR otherw norm valves, mild LAdil (25m), norm RV function & PA pressures  LABS 01/2016 Hosp>  Chems- ok x Cr=1.3-1.4, BS=120-140, Alb=2.8;  CBC- anemic w/ Hg=8-9 range, Plat~130K range;  TSH=0.92  CT Chest 02/21/16>  Norm heart size, extensive coronary calcif, s/p CABG, decr size of the pathological  right paratracheal LN, decr size of RUL mass (now ~2cm & prev ~4cm), new confluent airsp opac w/ air bronchograms in RUL w/ large right effusion; s/p GB, +HH, Abd Ao stent graft, DJD & DISH w/o metastatic lesions  CT Angio Chest 02/24/16>  NEG for PE, s/pCABG & stent in L[place], norm heart size, no pericard fluid, small right pleural effusion, architectural distortion & interstitial opacities in RUL c/w XRT changes, RUL nodule measures ~2cm, no adenopathy reported...   CXR 02/29/16>  Stable heart size, tortuous Ao, s/p CABG, sl improvement in interstitial opac in RUL area & posteromedial RUL nodule  LABS 02/29/16>  Chems- ok x HCO3=36, BS=120, BUN/Cr=30/1.40;  CBC- Hg=10.9, Plat=249K, WBC=18.2 (on Pred);  BNP=1053;  Sed=53 IMP/PLAN>>  Problem list as above w/ signif cardiac, pulmonary, & post chemoradiation changes;  From the pulm standpoint- he feels the BREO is helping & hasn't needed the rescue inhaler very often, continue same;  He is on a long PRED taper per DrManning for poss radiation fibrosis RUL, Sed rate is 53, continue this taper & careful w/ sweets/ sugar etc;  From the cardiac standpoint- he has signif underlying dis w/ Lmain stent, s/p CABG 20 yrs ago, prob ischemic cardiomyopathy w/ worsening 2DEcho recently w/ 30-35% EF + HK&AK seen, BNP=1053, Cards plans ischemic work up when able, in the meanwhile he has improved w/ diuresis but Chems w/ mild RI & HCO3=36... REC- add DIAMOX-ER 500 one tab daily at 4pm, continue his Lasix40 Qam, increase water intake, watch weights... We plan ROV 164monthecheck...  ~  April 04, 2016:  50m74moV w/ SN>> BilDontrelllled the Oncology symptom management clinic 8/4 w/ intermittent cough, wheezing, fever; they did a f/u CXR interpreted as showing a right base opac suspicious for acute pneumonia but this is a soft finding on my review of the XRay, and the prev RUL reticulonod opac is improved (radiation fibrosis improved w/ Pred taper); placed on Levaquin Rx;  He denies sput  production, hemoptysis, CP, or SOB but he is fairly sedentary.    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    ~  [place]>  Fever, cough, increased RUL opac (PCT<0.10, he received 10d Levaquin) & right>left effusion w/ thoracentesis 02/22/16 removing 1200cc clear yellow fluid, prob transudate, & BNP was >900; meds adjusted & he was diuresed w/ improvement but 2DEcho revealed worsening CHF w/ EF ~30-35% + HK & AK => he has f/u pending w/ Cards...    ~  [facility]  He had right pleural effusion tapped 02/22/16=> Prob TRANSUDATE w/ TProt<3.0, LDH=95, Cytology=NEG (reactive mesothelial cells only);  Cults=> no growth    ~  03/2016 developed cough/ wheezing/ low grade fever=> CXR w/ improved RUL, ?incr markings R base, he was off the Pred, given more Levaquin...     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    ~  He saw CARDS PA 03/01/16> no change in meds, Lexiscan Myoview 03/06/16 showed hi risk study w/ EF=30%, no ST segm changes, c/w antero-apical MI & no evid reversible ischemia...    ASPVD- s/p AAA stent graft 11/2012, mod bilat carotid occlusive dis (asymptomatic) w/ CDopplers showing ~50-60% bilat ICAstenoses>  [facility]- plans yearly f/u CT Abd and CDopplers    MEDICAL issues>  HBP, HL, GERD/ Divertic/ Polyps, CKD-stage3, Anemia & thrombocytopenia>  On Simva80-1/2Qd, Prilosec20/d, Fe/ MVI/ etc... EXAM shows Afeb, VSS, O2sat=99% on RA; Wt=178#;  HEENT- neg, mallampati2;  Chest- sl decr BS right base, few rhonchi, w/o w/r/consolidation;  Heart- RR Gr1/6SEM w/o r/g;  Abd- soft, neg;  Ext- neg w/o c/c/e...  Lexiscan Myoview 03/06/16 showed hi risk study w/ EF=30%, no ST segm changes, c/w antero-apical MI & no evid reversible ischemia...  CXR 03/30/16>  incr patchy opac at the right base, no effusion, RUL reticulonodular opac in RUL has regressed; norm heart size s/p CABG, abd Ao endograft part vis  LABS 03/28/16>  Chems- ok w/ K=4.0, HCO3=23, Cr=1.6, BS=145;  CBC- ok w/ Hg=11.4, wbc=6.1, plat=85K;   IMP/PLAN>>  Discussed w/ Regina-- continue the Levaquin til gone, use Tylenol for fever or pain; continue the Breo daily & Ventolin-HFA as needed; wean off the Diamox at this time, continue Lasix40, no salt, etc; he needs to gradually increase his exercise/ mobility; we will plan ROV w/ labs in 217mo.  ~  June 06, 2016:  35m67moV w/ SN>  BilAngieports that he is feeling better, gaining strength, walking 35mi81m mowing yard, and resting well at night;  He notes min hacking cough, no sput- no discoloration or blood, SOB is diminished & he denies CP, no f/c/s... He saw DrMohamed 05/16/16- pt remains on observation (s/p chemoradiation, 5 cycles w/ partial  response); repeat CT Chest 05/10/16 showed more confluent airsp dis in RUL w/ vol loss, abn soft tissue attenuation in right hilum has progressed (?felt to be treatment related), stable subcarinal LN ~11mm55mderate right effusion... DrMohamed felt this scan showed no concerning features of dis progression & they opted for continued observ  w/ f/u 50mo.. 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    ~  [place] 01[place]>  Fever, cough, increased RUL opac (PCT<0.10, he received 10d Levaquin) & right>left effusion w/ thoracentesis 02/22/16 removing 1200cc clear yellow fluid, prob transudate, & BNP was >900; meds adjusted & he was diuresed w/ improvement but 2DEcho revealed worsening CHF w/ EF ~30-35% + HK & AK => he has f/u pending w/ Cards...    ~  [facility]  He had right pleural effusion tapped 02/22/16=> Prob TRANSUDATE w/ TProt<3.0, LDH=95, Cytology=NEG (reactive mesothelial cells only);  Cults=> no growth    ~  03/2016 developed cough/ wheezing/ low grade fever=> CXR w/ improved RUL, ?incr markings R base,  he was off the Pred, given more Levaquin...     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    ~  He saw CARDS PA 03/01/16> no change in meds, Lexiscan Myoview 03/06/16 showed hi risk study w/ EF=30%, no ST segm changes, c/w antero-apical MI & no evid reversible ischemia...    ASPVD- s/p AAA stent graft 11/2012, mod bilat carotid occlusive dis (asymptomatic) w/ CDopplers showing ~50-60% bilat ICAstenoses>  [facility]- plans yearly f/u CT Abd and CDopplers    MEDICAL issues>  HBP, HL, GERD/ Divertic/ Polyps, CKD-stage3, Anemia & thrombocytopenia>  On Simva80-1/2Qd, Prilosec20/d, Fe/ MVI etc... EXAM shows Afeb, VSS, O2sat=95% on RA; Wt=183# (up 5#);  HEENT- neg, mallampati2;  Chest- sl decr BS right base, few rhonchi, w/o w/r/consolidation;  Heart- RR Gr1/6SEM w/o r/g;  Abd- soft, neg;  Ext- neg w/o c/c/e...  CT Chest 05/10/16 showed more confluent airsp dis in RUL w/ vol loss, abn soft tissue attenuation in right hilum has progressed (?felt to be treatment related), stable subcarinal LN ~1161mmoderate right effusion.  CXR 06/06/16> progressive incr density in RUL & vol loss, soft tissue fullness in right hilum, s/p CABG and calcif in wall of Ao arch  PET scan 06/15/16> persistent but diminished hypermetabolism in the RUL area of interstitial & airsp opac-  likely related to XRT, no hypermetabolism in the prev R paratrachial LN, similar sized subcarinal LN w/ low level hypermetabolism noted; new area of LUL anterior  interstitial & airsp dis w/ some bronchiectasis shows low level hypermetabolism (?radiation change?); moderate right effusion...  IMP/PLAN>>  He is feeling better, performance status improving, CXR shows progressive changes in RUL s/p chemoradiation; CT=>PET scans w/ vol loss & persistent hypermetabolism/ evolving changes in RUL & right hilum plus he has a mod large right effusion; prev thoracentesis 01/2016 in hosp was transudative- I believe he would benefit from further eval/ repeat thoracentesis to recheck this fluid; we will set this up via IR & ask them to drain the fluid as much as poss, send it for Cytology, cell ct & diff, TProt/ LDH/ Gluc...  ~  August 06, 2016:  85moROV w/ SN>  BAldinreports that his SOB & energy have improved, appetite is better; he is more active walking 271m5d/wk + yard work/ mowing/ leaves/ etc; he notes min cough, small amt beige sput, stable DOE, no CP or edema... He tells me that DrMohamed has CT Chest & LABS ordered for next week so we will wait for these tests & review when avail...     COPD (Stage3 w/ signif revers component), former smoker (quit 1996 w/ ~40+ pack-yr hx)>  On Symbicort160-2spBid regularly ([facility] changed Rx), VentolinHFA rescue inhaler as needed...    He saw DrCherly Hensenor CARDS 07/04/16>  HBP, CAD- s/p CABG 1996 & subseq PCI to graft vessels, chr combined sys&diast CHF w/ cardiomyopathy, PVD- w/ carotid dis & Ao stent graft per drLawson, HL, CKD;  No changes made to his ASA/ Plavix, Metoprolol50-1.5Bid, Lasix40, Simva40...     EXAM shows Afeb, VSS, O2sat=98% on RA; Wt=187# (up 4#);  HEENT- neg, mallampati2;  Chest- sl decr BS right base, few rhonchi, w/o w/r/consolidation;  Heart- RR Gr1/6SEM w/o r/g;  Abd- soft, neg;  Ext- neg w/o c/c/e...  Throacentesis 06/22/16 by IR>  1.2L removed (hazy yellow fluid)=> Cell ct=923 cells w/ 2 Neutro/ 58 lymph/ 40 mono-macrophages;  Chems- LDH=119, TPro=4.1, Gluc=101;  Cyto= Atypic cells c/w reactive mesothel cells.  Post  thoracentesis CXR 06/22/16>  decr right effusion, no pneumoth, dense consolid RUL unchanged, post-op BABG...  CT Chest 08/14/16>  Norm heart size- s/p CABG & Ao atherosclerotic changes; stable upper lim adenopathy; decr right effusion- now small in size; similar interstitial & airsp dis in right perihilar & medial LUL is c/w radiation fibrosis; no signif pulm nodules or masses- no new or progressive dis...   LABS 08/14/16>  Chems- ok x Cr=1.6;  CBC- ok w/ Hg=12.7...Marland KitchenMarland KitchenMP/PLAN>>  BiIzaihas clinically stable, no clear evid of recurrent dis & followed very closely by DrMohamed; Pulm stable on Symbicort Bid, Albut rescue prn, and his exercise- continue same & we plan rov recheck in 28m40mo  ~  November 05, 2016:  28mo38mo & Yidel indicates "stronger, better" on diet & exercise program w/ good energy (eg- climbed steps at a ball game);  He denies much cough (clear throat), small amt clear sput, no blood, SOB w/ exertion only- ADLs ok & DOE stable, no CP & he describes 2mi 25mk 5d/wk & he stays busy...  [facility] (sl incr subcarinal LN, scarring & radiation fibrosis, ?sl incr right effusion)... 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    ~  [place]>  Fever, cough, increased RUL opac (PCT<0.10, he received 10d Levaquin) & right>left effusion w/ thoracentesis 02/22/16 removing 1200cc clear yellow fluid, prob transudate, & BNP was >900; meds adjusted & he was diuresed w/ improvement but 2DEcho revealed worsening CHF w/ EF ~30-35% + HK & AK => he has f/u pending w/ Cards...    ~  [facility]  He had right pleural effusion tapped 02/22/16=> Prob TRANSUDATE w/ TProt<3.0, LDH=95, Cytology=NEG (reactive mesothelial cells only);  Cults=> no growth    ~  03/2016 developed cough/ wheezing/ low grade fever=> CXR w/ improved RUL, ?incr markings R base, he was off the Pred, given more Levaquin...     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    ~  He saw CARDS PA 03/01/16> no change in meds, Lexiscan Myoview 03/06/16 showed hi risk study w/ EF=30%, no ST segm changes, c/w antero-apical MI & no evid reversible ischemia...    ASPVD- s/p AAA stent graft 11/2012, mod bilat carotid occlusive dis (asymptomatic) w/ CDopplers showing ~50-60% bilat ICAstenoses>  [facility]- plans yearly f/u CT Abd and CDopplers    MEDICAL issues>  HBP, HL, GERD/ Divertic/ Polyps, CKD-stage3 (cr=1.6), Anemia (Hg=11.9) & thrombocytopenia (plat=171K)>  On Simva80-1/2Qd, Prilosec20/d, Fe/ MVI etc... EXAM shows Afeb, VSS, O2sat=98% on RA; Wt=189# (up 2#);  HEENT- neg,  mallampati2;  Chest- sl decr BS right base, few rhonchi, w/o w/r/consolidation;  Heart- RR Gr1/6SEM w/o r/g;  Abd- soft, neg;  Ext- neg w/o c/c/e...  CT Chest 10/30/16>  Norm heart size, s/p CABG, atherosclerosis in Ao; prev 164msubcarinal LN now measures 1350msl incr in right pleural effusion, radiation fibrosis & changes in medial right lung and ant LULare stable/ unchanged;   LABS 10/29/16>  Chems- wnl w/ Cr=1.6, LFTs= wnl;  CBC- ok w/ Hg=11.9, WBC=10.1  Ambulatory Oximetry 11/05/16>  O2sat=96% on RA at rest w/ pulse=81/min;  He ambulated 3 laps in office (185'each) w/ lowest O2sat=90% w/ pulse 99/min... IMP/PLAN>>  BilTaheem doing satis on Symbicort160-2spBid & Albut rescue inhaler prn; he states that he is active, energy is good, & that he's getting stronger &  better (good performance status); I suspect that he has residual cancer in the right chest but it appears slowly progressive (w/ sl incr right effusion & subcarinal LN) and DrMohamed to review status & decide regarding additional therapy at this point vs continued observation... we plan rov recheck in 3-556mo  NOTE:  >50% of this 25 min rov was spent in counseling & coordination of care...  ~  Jan 02, 2017:  26776moOV & Takeo called 4/24 c/o cough, yellow sput, low grade fever after having been Rx x2 by PCP w/ Doxy (note- Flu-B was pos in resp viral panel); we placed him on Levaquin & Medrol dosepak & he is some better- still congested and phlegm is beige but feeling better overall; we discussed addition of Mucinex 1200bid + fluids vs Guaifenesin 400-2Tid to save $$...     COPD> on Symbicort160-2spBid and Ventolin-HFA prn    Lung cancer- followed by DrMohamed & DrManning> RUL non-small cell ca IIIA, dx 3/17 & rx w/ Chemoradiation; subseq RUL opac (infection vs XRT-fibrosis & right effusion;  He saw DrMohamed 11/15/16- note reviewed & he was doing well at that time, playing golf & feeling good; CT Chest 10/30/16 w/ sl incr right effusion, sl incr  subcarinal LN from 10=>1374mradiation fibrosis areas stable; DrMohamed felt there was no clear sign of dis progression & rec f/u scans in 776mo456moe 01/2017)...    Cardiac- CAD, s/p CABG, Cardiomyopathy, CHF> on ASA/ Plavix, Metop25Bid, Lasix40; he saw DrNiCherly Hensen7/18- note reviewed; EF=30%, he mentioned more aggressive cardiac w/u & rx if cancer progrosis favorable (no changes made, f/u 56mo 4moDEcho)...    ASPVD- s/p AAA stent graft, bilat carotid dis> on ASA/ Plavix    Medical issues>  HBP, HL, GERD/ Divertic/ Polyps, CKD-stage3 (cr=1.6), Anemia (Hg=11.9) & thrombocytopenia (plat=171K)> EXAM shows Afeb, VSS, O2sat=98% on RA; Wt=183# (down 6#);  HEENT- neg, mallampati2;  Chest- sl decr BS right base, few rhonchi, w/o w/r/consolidation;  Heart- RR Gr1/6SEM w/o r/g;  Abd- soft, neg;  Ext- neg w/o c/c/e... IMP/PLAN>>  BillyJosephmproved but it's been a slow process; he is at high risk for cancer progression & followed closely by DrMohamed who plans f/u CT & recheck in June;  Continue current meds, add Guaifenesin 2400mg/42mwater to aid in mucus production... We will recheck in 76mo & 59moew his f/u scans planned by Oncology.  ~  February 04, 2017:  6mo ROV96modd-on appt requested for cough, thick yellow sput, chest congestion w/ wheezing>  Notes low grade temp in the afternoon w/o c/s;  Denies CP/ palpit/ hemoptysis/ change in SOB/DOE; He is taking SYMBICORT160-2spBid, GFN 400-2Tid + fluids, plus rescue inhaler => we gave him a NEB Rx in office (min relief), sent sput for C&S (grew abundant NTF only); we discussed need for f/u CXR (CT due soon from Mohamed)Madelinex w/ Levaquin/ Pred taper...    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    ~  HDresserx2 in 01/2016>  Fever, cough, increased RUL opac (PCT<0.10, he received 10d Levaquin) & right>left effusion w/ thoracentesis 02/22/16 removing 1200cc clear yellow fluid, prob transudate, & BNP was >900; meds adjusted & he was diuresed w/ improvement but 2DEcho revealed worsening CHF w/ EF ~30-35% + HK & AK => he has f/u pending w/ Cards...    ~  [facility]  He had right pleural effusion tapped 02/22/16=> Prob TRANSUDATE w/ TProt<3.0, LDH=95, Cytology=NEG (reactive mesothelial cells only);  Cults=> no growth    ~  03/2016 developed cough/ wheezing/ low grade fever=> CXR w/ improved RUL, ?incr markings R base, he was off the Pred, given more Levaquin...     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    ~  He saw CARDS PA 03/01/16> no change in meds, Lexiscan Myoview 03/06/16 showed hi risk study w/ EF=30%, no ST segm changes, c/w antero-apical MI & no evid reversible ischemia...    ASPVD- s/p AAA stent  graft 11/2012, mod bilat carotid occlusive dis (asymptomatic) w/ CDopplers showing ~50-60% bilat ICAstenoses>  Stable & [facility]- plans yearly f/u CT Abd and CDopplers    MEDICAL issues>  HBP, HL, GERD/ Divertic/ Polyps, CKD-stage3 (cr=1.6), Anemia (Hg=11.9) & thrombocytopenia (plat=171K)>  On Simva80-1/2Qd, Prilosec20/d, Fe/ MVI etc... EXAM shows Afeb, VSS, O2sat=97% on RA; Wt=179# (down 4#);  HEENT- neg, mallampati2;  Chest- sl decr BS right base, few rhonchi, w/o w/r/consolidation;  Heart- RR Gr1/6SEM w/o r/g;  Abd- soft, neg;  Ext- neg w/o c/c/e...  CXR 02/04/17 (independently reviewed by me in the PACS system)> ?loculated mod right pleural effusion, RUL airsp dis is unchanged & c/w post XRT changes; left lung is clear, stable cardiac silhouette & prior CABG  Sputum C&S> abundant NTF, no pathogen identified... IMP/PLAN>>  We discussed the need for another thoracentesis=> check cytology;  We will Rx w/ empiric LEVAQUIN & PREDNISONE taper, plus his GFN at max doses w/ fluids;  Also awaiting f/u CT Chest due soon...  ADDENDUM>> Thoracentesis by IR 02/11/17>  280cc of dark bloody fluid removed, due to loculation no additional fluid was obtainable;  Fluid= exudative. & Cytology= NEGATIVE... ADDENDUM>> f/u CT Chest done 02/13/17>  Norm heart size, aortic atherosclerosis, prev CABG; mod loculated right pleural effusion; mass-like architectural distortion in RUL c/w XRT, new area of airsp consolidation in RML~3cm, mult faint nodular densities scattered on the right lung, borderline subcarinal node; Abd & musculoskeletal are ok... ADDENDUM>>  02/15/17>  Pt indicates that he is ?no better, c/o persistent cough, congested, thick yellow sput, no hemoptysis, low grade temps 99-100 range, denies much CP (just sore from coughing) and SOB/DOE about the same;  We discussed his limited options here & have suggested the following--    1) keep PRED 26m/d for now;  Add empiric AUGMENTIN 8749mone tab bid x10d  (plus Align/Activia);  Add NEBULIZER w/ ALBUTEROL TID regularly and space out the Symbicort Rx still at 2sp Bid...Marland KitchenMarland Kitchen  2) check 2DEcho to compare EF to 7/17 (30-35%)    3) refer to TCTS for the chr right pleural effusion to consider poss chest tube vs VATS vs decortication surg (DrBartle did his CABG yrs ago)  ~  July 18.2018:  86m34moV & post hosp visit>  BilVineets undergone an extensive work up over the last month or so w/ thoracentesis, CT Chest, TSurg consultation followed by VATS drainage & pleurosesis, and f/u 2DEcho showing worsening LVF=> referred to CHF clinic & there recommendations are pending...    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) presenting w/ RUL mass w/ mediastinal adenopathy 3/17;  He received chemoradiation w/ wkly carboplatin & paclitaxel (5 cycles w/ part response) + XRT from DrManning finished 01/26/16;  Follow up CT Chest 02/13/17 reviewed (see above), & DrMohamed felt there was no recurrence or dis progression but +for inflamm changes in RLL & loc pleural effusion; rec to re-scan in 42mo10mo We set him up to see DrBartle 02/25/17> he reiewed prev eval, scan, thoracenteses, etc; he agreed w/ performing a right VATS to drain the resid effusion 7 examine the pleural space for tumor/ infection etc followed by pleurodesis...    He was ADM 7/6 - 03/08/17 by DrBartle w/ right VATS w/ drainage of pleural effusion & Bx of pleural surface + talc pleurodesis=> all pathology was benign (fibrous tissue & chr inflamm; Disch on similar med regimen + [id number] stable since surg- +SOB/DOE w/ stairs etc, reports eating well, no CP & hasn't required pain meds; denies f/c/s, notes min cough & sm amt whitish sput- no color, no blood... Finishing Levaquin, off Pred, on NEB w/ Albut Tid, Symbicort160-2spBid, GFN400-2Tid + fluids...    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 EXAM shows Afeb, VSS, O2sat=99% on RA; Wt=174# (down 5#);  HEENT- neg, mallampati2;  Chest- sl decr BS right base, few rhonchi, w/o w/r/consolidation;  Heart- RR Gr1/6SEM w/o r/g;  Abd- soft, neg;  Ext- neg w/o c/c/e...  2DEcho done 02/26/17 showed mod dil LV & severely reduced LVF w/ EF down to 20-25%, Gr1DD, mod calcif AoV leaflets, MV was OK, RV mod dilated & sys function mildly reduced, no PAsys reported...Marland KitchenMarland Kitchen  IMP/PLAN>>  The worsening LVF w/ EF 28-78% is certainly playing a roll in his unrespolved dyspnea=> refer to CHF clinic for active management; in the interim continue NEBs, Symbicort, Mucinex, etc...   ~  April 24, 2017:  6wk [place] had CHF clinic eval by [facility] on 04/22/17-- note reviewed, Hx CAD w/ CABG in 1996 & PCI in 2015, ischemic cardiomyopathy, and c/o incr SOB & recent 2DEcho (02/2017) showed EF=20-25%;  DrMcLean adjusted his meds by changing Metoprolol to Coreg & adding Entresto, ultimately he would like to add Spironolactone; they discussed R+L heart cath soon (sched 9/14)...      Davonn has had f/u appts w/ PCP-DrLuking on 03/14/17 (transitional care follow up) and TCTS-DrBartle on 03/20/17>  Notes reviewed, his breathing is somewhat improved, on Levaquin for Serratia, DrBartle noted>>  Most of the right lung is firmly adherent to the chest wall from his prior XRT. There is a loculated fluid collection at the apex of the chest that is sealed in by the lung and could not be drained. There was a large collection in the  lower pleural space that was drained and talc pleurodesis performed. Hopefully this space will resolve over time with the talc and not refill with fluid. There is no evidence of malignancy so it is possible that this is related to heart  failure given his EF of 20-25%. He reports that he has started back to walking & slowly building up, even played 2 nine-hole rounds of golf when it wasn't too humid; he has a resid cough but decr sput & easier to produce, he requests a handicap placard... See prob list above... EXAM shows Afeb, VSS, O2sat=96% on RA; Wt=178# (up 4#);  HEENT- neg, mallampati2;  Chest- sl decr BS right base, few rhonchi, w/o w/r/consolidation;  Heart- RR Gr1/6SEM w/o r/g;  Abd- soft, neg;  Ext- neg w/o c/c/e...  CXR 04/24/18 (independently reviewed by me in the PACS system) shows left lung clear, right sided vol loss, small A/F levels at the base, pleural fluid vs thickening at right base, stable soft tissue fullness at right apex, s/p CABG etc. IMP/PLAN>>  Andri appears stable post T-surg & improved w/ CHF-clinic Rx per DrMcLean;  OK 2018 FLU shot today, and continue pulm rx w/ Albut NEBS Tid, Symbicort160-2spBid, GFN400-2Bid;  Call for any questions and we plan f/u visit in 33mo..  ~  June 24, 2017:  261moOV & [place] presents w/ a 2d hx incr cough, yellow sput, & chest congestion- denies f/c/s;  He's been using NEBs Qid (incr per DrLuking), plus his Symbicort160-2spBid & GFN400-2Tid;  He had a ZPak called in recently;  We discussed BacterimDS Bid x10d for  this COPD exac + Pred20- 5d slow tapering protocol (see AVS) w/ rov recheck 6wks...     He saw TCTS-DrBartle 05/22/17>  S/p bronch & right VATS to drain the loculated right effusion & talc pleurodesis 03/01/17;  At surg he noted most of the right lung was firmly adherent to the chest wall from his prior XRT. There was a loculated fluid collection at the apex of the chest that was sealed in by the lung and could not be drained. There was a large collection in the lower pleural space that was drained and talc pleurodesis performed. Unfortunately the right lower lobe was partially collapsed and chronically scarred and would not expand to fill in the space formed after draining the effusion => they signed off...    He saw [facility]-DrCain 05/24/17>  Hx AAA- s/p endovasc stent graft repair, & hx Carotid art dis being followed;  Abd Ao Duplex was stable w/ 3.3 x 3.4cm aneurysm w/o elev velocities;  CDuplex w/ 40-59% RICA stenosis & 3-29% LICA and they plan recheck 55yr..    He saw CARDS-DrMcLean 05/27/17>  CAD, s/p CABG 1996, subseq PCIs, RBBB, ischemic cardiomyopathy/ CHF w/ 2DEcho showing EF=20-25% in 7/18;  Also has Hx AAA w/ prec stent graft repair;  He had CATH 04/2017 showing occluded SVG-small D, patent LIMA-LAD and SVG-PDA, 50% shelf-like stenosis distal left main (no intervention), filling pressure not elevated and cardiac output preserved.  On Coreg & Entresto, they added spironolactone & f/u 249mo   He saw ONC-DrMohamed 05/28/17>  DIAGNOSIS: Stage IIIA (T2b, N2, M0) non-small cell lung cancer, favoring squamous cell carcinoma presented with right upper lobe lung mass in addition to mediastinal lymphadenopathy diagnosed in March 2017.  PRIOR THERAPY:  A course of concurrent chemoradiation with weekly carboplatin for AUC of 2 and paclitaxel 45 MG/M2. Status post 5 cycle with partial response.  CURRENT THERAPY: Observation.  He had f/u CXR & CT Chest (see below)- no evid of dis progression/ recurrence & f/u planned  53m39mo    He saw PCP-DrLuking 06/06/17>  Annual wellness visit, note reviewed, same meds & they discussed compliance issues... EXAM shows Afeb, VSS, O2sat=98% on RA; Wt=178# (stable);  HEENT- neg, mallampati2;  Chest- sl decr BS right base, few rhonchi, w/o w/r/consolidation;  Heart- RR Gr1/6SEM w/o r/g;  Abd- soft, neg;  Ext- neg w/o c/c/e...  CT Chest 05/21/17>  See report -- IMPRESSION:  1) Similar radiation fibrosis within the paramediastinal right lung.  2) Increase in ill-defined micro nodularity bilaterally. Favor infection, including atypical etiologies.  3) Interval T4 compression deformity, favored to be posttraumatic. No metastasis identified in this region on the prior CT.  4) Aortic Atherosclerosis.  5) Loculated right-sided pleural fluid and inferior right hydropneumothorax, similar.  6) Tiny hiatal hernia. Gastric underdistention with possible gastric wall thickening suggestive of gastritis.  7) Similar borderline subcarinal adenopathy.  8)  Emphysema.  CXR 05/22/17>  Norm heart size w/ prev CABG & ao calcif, stable vol loss on the right & soft tissue fullness in right hilum, w/ consolid right apex, mod right effusion w/ loculated air/fluid IMP/PLAN>>  As above- we discussed treating THIS COPD exac w/ BacterimDS & PRED taper; he is to continue w/ his NEBS, Symbicort, GFN and we plan recheck in 6wks time...   ~  August 08, 2017:  6wk ROV & it is unclear if BilAkhiler really cleared up after the last OV- he'd been treated w/ ZPak, then BacterimDS w/ Pred taper but returns today stating "not good, I need a little help, getting weaker"; he brought in a sputum sample w/ thick beige sput=> cult grew moraxella cataralis & we discussed treatment this go-round w/ AUGMENTIN + Depo80 & a prolonged slow tper of Medrol8mg38mbs...     He had one interim f/u visit 08/02/17 w/ CARDS-DrMcLean> CAD, s/p CABG 1996, subseq PCIs, RBBB, ischemic cardiomyopathy/ CHF w/ 2DEcho showing EF=20-25% in 7/18;  Note  reviewed- they titrated up his Coreg to 9.375Bid, Entresto to 24/26Bid, & Spironolactone to 25mg64m. We reviewed the following medical problems during today's office visit>     COPD (Stage3 w/ signif revers component), former smoker (quit 1996 w/ ~40+ pack-yr hx)>  On NEBs w/ Albut Qid, Symbicort160-2spBid regularly, VentolinHFA rescue inhaler & GFN as needed; with persistent symptoms we discussed changing Albut  to DUONEB Qid, Symbicort160-2spBid, incr GFN400 to 2tabsTid + fluids & to work hard expectorating the mucus...    Freq COPD exacerbations & sput C&S growing Serratia 7/18 (resist to cephalosporins), and MCat 12/18 (wasn't better after ZPak & BacterimDS, therefore switched to Augmentin rx)... Now w/ MCat in sput & no better after ZPak & BacterimDS + Pred- we are treating w/ Augmentin x10d & long slow Medrol taper...     Non-small cell lung cancer, Stage IIIA, diagnosed 10/2015 & treated by DrMohamed & DrManning w/ Chemoradiation (carboplatin & paclitaxel)     ~  Bronch 11/17/15 by Kallie Edward showed no endobronchial lesions- TBBx were neg, but brushing & washings were pos for malig cells (felt to be c/w SqCellCa)    ~  He received ChemoRx from DrMohamed with weekly carboplatin and paclitaxel status post 4 cycles finished 01/24/16 (dose held on several occas due to side effects)    ~  He received XRT 4/17 - 01/26/16 by DrManning to the primary tumor & involved mediastinal adenopathy w/ 66 Gy (33 fractions of 2Gy each); he had mod esophagitis & some fatigue, plus post radiation scarring in the lung...    ~  DrMohamed follows w/ serial CT Chest scans every 636mo=> now every 638mo/ f/u due 11/2017...    Abnormal CXR w/ RUL mass, s/p treatment as above, & subseq RUL opac (?infection vs radiation fibrosis changes), right pleural effusion>      ~  Treated w/ antibiotic Rx & PREDNISONE taper (30-25-20-15-10-5 Q5d over 36m26moer DrManning...    ~  [facility] [facility]>  Fever, cough, increased RUL opac (PCT<0.10, he  received 10d Levaquin) & right>left effusion w/ thoracentesis 02/22/16 removing 1200cc clear yellow fluid, prob transudate, & BNP was >900; meds adjusted & he was diuresed w/ improvement but 2DEcho revealed worsening CHF w/ EF ~30-35% + HK & AK => Cards f/u.    ~  [facility]  He had right pleural effusion tapped 02/22/16=> Prob TRANSUDATE w/ TProt<3.0, LDH=95, Cytology=NEG (reactive mesothelial cells only);  Cults=> no growth    ~  Progressive changes in right chest and recurrent symptoms over 2017-2018 w/ interval thoracentesis etc-- all led up to right VATS surg & talc pleurodesis 02/2017 by DrBartle (all path was benign)...    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    ~  He saw CARDS PA 03/01/16> no change in meds, Lexiscan Myoview 03/06/16 showed hi risk study w/ EF=30%, no ST segm changes, c/w antero-apical MI & no evid reversible ischemia...    ~  Referred to DrMcLean's CHF clinic 7/18 w/ change in meds and titration of Coreg, Entresto, Spironolactone => on-going f/u & titration of meds...    ASPVD- s/p AAA stent graft 11/2012, mod bilat carotid occlusive dis (asymptomatic) w/ CDopplers showing ~50-60% bilat ICAstenoses>  [facility]- plans yearly f/u CT Abd and CDopplers    MEDICAL issues>  HBP, HL, GERD/ Divertic/ Polyps, CKD-stage3 (cr=1.6), Anemia (Hg=11.9) & thrombocytopenia (plat=171K)>  On Simva80-1/2Qd, Prilosec20/d, Fe/ MVI etc... EXAM shows Afeb, VSS, O2sat=95% on RA; Wt=183# (stable);  HEENT- neg, mallampati2;  Chest- sl decr BS right base, few rhonchi, w/o  w/r/consolidation;  Heart- RR Gr1/6SEM w/o r/g;  Abd- soft, neg;  Ext- neg w/o c/c/e...  LABS 08/02/17>  Chems- ok w/ Cr=1.43,  K=4.0 IMP/PLAN>>  As noted above Rush Landmark is having a rough time lately>  He had the right VATS surg & talc pleurodesis 02/2017;  He has a severe ischemic cardiomyopathy w/ EF= 20-25% & now followed closely by DrMcLean w/ freq (Q159mo visits in the CHF clinic for medication titration;  He has severe obstructive lung dis w/ FEV1=1.25 (40%) in 01/2016 and freq exacerbations as documented above;  We reviewed recommendations for NEBS w/ DUONEB Qid, Symbicort160-2spBid, GFN400-2tabs Tid w/ fluids;  We are starting a 10d course of AUGMENTIN875Bid and a long slow MEDROL taper w/ planned recheck in 6wks;  He will continue to work on chest physiotherapy & bronchial mucus hygiene...  ~  September 19, 2017:  6wk ROV & pulmonary follow up> BRush Landmarkis stable on his medication regimen as outlined above; he reports another URI w/ yellow/ green mucus & treated by his PCP (DrLuking) w/ [employer] he is still on the Medrol 875m 1/2 tab Qod; overall he feels that his SOB/ breathing is much improved;  He remains on DUONEB Qid, Symbicort160-2spBid, GFN400-2Tid w/ fluids, & currently on Medrol4m35md=> rec to stay on this...     COPD- GOLD Stage3 w/ revers component, & freq infectious exac> rec to be diligent w/ NEB treatments, Symbicort, GFN, etc...     Hx non-small cell lung cancer, stage IIIA- dx 10/2015 & treated by DrMohamed & DrManning w/ chemoradiation; he is due for 59mo21mo w/ oncology 11/2017...    Abn CXR- s/p treatment as noted, subseq RUL opac (?infection vs radiation fibrosis?) & right effusion...    CAD, s/pCABG1996, RBBB, ischemic cardiomyopathy w/ acute on chr sys & diast CHF> followed by DrMcLean's CHF clinic...    ASPVD- s/p AAA stent graft 2014, mod bilat carotid dis...    Medical issues> HBP, HL, GERD/ divertics/ polyps, CKD-stage3 (Cr~1.6), anemia, etc...  EXAM shows Afeb, VSS, O2sat=95% on RA; Wt=180# (stable);  HEENT- neg, mallampati2;  Chest- sl decr BS right base, few rhonchi, w/o w/r/consolidation;  Heart- RR Gr1/6SEM  w/o r/g;  Abd- soft, neg;  Ext- neg w/o c/c/e...  LABS 09/19/17>  FLP- all parameters at goals on diet alone;  Chems are ok on current meds & Cr stable at 1.54;  CBC- sl improved w/ Hg=12.3, WBC-9.4;  TSH=2.66;  BNP=413;  Sed=51... REC to continue Lasix40, & no salt diet + continue MEDROL 4mg 55m...  IMP/PLAN>>  Bill required mult antibiotics to get over his recent upper resp infection=> ZPak, then Bacterim, then Augmentin, then [facility] Medrol4mgQo38m is asked to stay on this; he requested follow up labs (as above); reminded to stay as active as poss w/ a regular exercise program...    ~  December 19, 2017:  8mo RO59moBilly rAbles feeling well- denies problems, walking 45min d49m & doing yard work!  He is much improved overall;  About 6wks ago he called w/ incr cough & mucus production, we called in Levaquin & he improved- now w/ min cough, sm amt whitish sput, decr SOB, stable DOE, no CP, & resting better... He remains on MEDROL 4mgQod, 74mS w/ Duoneb TID regularly, SYMBICORT160-2spBid, GFN400-2Tid w/ fluids, plus his cardiac meds... We reviewed the following interval medical notes since his last visit Jan2019>      He saw CARDS- DrMcLean on  10/08/17>  CAD, s/p CABG in 1996, w/ DES  to [facility] stenosis & 2 PCIs in 2015, ischemic cardiomyopathy/ CHF w/ EF down to 20-25% in 02/2017; meds adjusted w/ Entresto incr to 49/51Bid, decr Lasix20Qam, continue Aldactone25    He saw ONCOLOGY- DrMohamed on 12/03/17>  Hx RUL mass (stage IIIA non-small cell lung cancer- favoring squamous cell dis- treated w/ chemoradiation, 5 cycles w/ partial response, currently on observation;  Note reviewed, good performance status, f/u CT Chest 11/21/17 (see below) no findings to suggest new or progressive dis, REC to continue observation w/ f/u CT Chest & OV in 65mo..     He saw PCP- DrLuking 12/06/17>  HBP, HL, followed by Cards & Pulm;  Stable, same meds, asked to f/u 6852mo. We summarized his on-going cardio-pulm issues>       COPD- GOLD Stage3 w/ revers component, & freq infectious exac> rec to be diligent w/ NEB treatments, Symbicort, GFN, etc...     Hx non-small cell lung cancer, stage IIIA- dx 10/2015 & treated by DrMohamed & DrManning w/ chemoradiation; he is due for 31m1moV w/ oncology 11/2017...    Abn CXR- s/p treatment as noted, subseq RUL opac (?infection vs radiation fibrosis?) & right effusion=> VATS w/ talc pleurodesis (no malig cells)...    CAD, s/pCABG1996, RBBB, ischemic cardiomyopathy w/ acute on chr sys & diast CHF> followed by DrMcLean's CHF clinic...    ASPVD- s/p AAA stent graft 2014, mod bilat carotid dis...    Medical issues> HBP, HL, GERD/ divertics/ polyps, CKD-stage3 (Cr~1.6), anemia, etc...  EXAM shows Afeb, VSS, O2sat=100% on RA; Wt=187#;  HEENT- neg, mallampati2;  Chest- sl decr BS right base, few rhonchi, w/o w/r/consolidation;  Heart- RR Gr1/6SEM w/o r/g;  Abd- soft, neg;  Ext- neg w/o c/c/e...  CT Chest 11/21/17>  IMPRESSION:    1. Similar appearance of changes due to external beam radiation within the right lung and medial left upper lobe. No findings to suggest new or progressive disease.    2. Stable appearance of loculated right-sided pleural effusion status post talc pleurodesis.    3. Stable borderline subcarinal adenopathy.    4. Stable T4 compression fracture.  LABS 11/26/17 in Epic>  Chems- ok x Cr=1.52 (stable);  CBC- ok x Hg=11.8 IMP/PLAN>>  BilCaius remarkably stable w/ his severe pulmonary & cardiac diseases;  We reviewed his medication regimen & rec to continue same;  We reviewed diet & exercise prescriptions;  We plan rov recheck 52mo752moCXR...    Past Medical History:  Diagnosis Date  . AAA (abdominal aortic aneurysm) (HCC)[place]   4.4 cm 08/2008;4.44 in 7/10 and 4.65 in 08/2009; 4.8 by CT in 11/2009; 4.3 by ultrasound in 08/2010  . Anemia   . Arteriosclerotic cardiovascular disease (ASCVD) 1996   CABG-1996  . Arthritis    "fingers" (03/18/2014)  . CAD (coronary artery  disease)    03/18/14:  PCI with DES to distal left main. 7/29: DES to the SVG to Diag  . Cancer (HCC)[place] Upper right lobe lung cancer  . Cardiomyopathy, ischemic    Echo 03/17/14: EF 45-50%  . Chronic bronchitis (HCC)[place]. Chronic kidney disease    CRF  . Chronic rhinitis   . Colonic polyp 2002   polypectomy in 2002  . COPD (chronic obstructive pulmonary disease) (HCC)[place]. Diverticulosis   . Dyspnea    with exertion  . ED (erectile dysfunction)   . Encounter for antineoplastic chemotherapy 12/19/2015  . GERD (gastroesophageal reflux disease)   . History  of blood transfusion   . Hyperlipidemia   . Hypertension   . IFG (impaired fasting glucose)   . Myocardial infarction [facility])    "told h/o silent MI sometime before 1996"  . Pneumonia ~ 2001; ~ 2005    has had more than twice  . Right bundle branch block   . Tobacco abuse, in remission    40 pack year total consumption; discontinued in 1996    Past Surgical History:  Procedure Laterality Date  . ABDOMINAL AORTIC ANEURYSM REPAIR  11/2012  . ABDOMINAL AORTIC ENDOVASCULAR STENT GRAFT N/A 12/11/2012   Procedure: ABDOMINAL AORTIC ENDOVASCULAR STENT GRAFT;  Surgeon: Mal Misty, MD;  Location: [place];  Service: Vascular;  Laterality: N/A;  Ultrasound guided; [place]  . CARDIAC CATHETERIZATION  01/08/1995  . COLONOSCOPY  2002   polypectomy-patient denies  . CORONARY ANGIOPLASTY WITH STENT PLACEMENT  03/18/2014   "1"  . CORONARY ANGIOPLASTY WITH STENT PLACEMENT  03/24/2014   "1"  . CORONARY ARTERY BYPASS GRAFT  01/09/1995   "CABG X3"  . FLEXIBLE BRONCHOSCOPY N/A 03/01/2017   Procedure: FLEXIBLE BRONCHOSCOPY WITH BIOPSIES;  Surgeon: Gaye Pollack, MD;  Location[facility];  Service: Thoracic;  Laterality: N/A;  . JOINT REPLACEMENT    . LAPAROSCOPIC CHOLECYSTECTOMY  12/2009  . LEFT AND RIGHT HEART CATHETERIZATION WITH CORONARY/GRAFT ANGIOGRAM N/A 03/18/2014   Procedure: LEFT AND RIGHT HEART CATHETERIZATION WITH Beatrix Fetters;   Surgeon: Blane Ohara, MD;  Location: [facility] CATH LAB;  Service: Cardiovascular;  Laterality: N/A;  . PERCUTANEOUS CORONARY STENT INTERVENTION (PCI-S)  03/18/2014   Procedure: PERCUTANEOUS CORONARY STENT INTERVENTION (PCI-S);  Surgeon: Blane Ohara, MD;  Location: [facility] CATH LAB;  Service: Cardiovascular;;  . PERCUTANEOUS CORONARY STENT INTERVENTION (PCI-S) N/A 03/24/2014   Procedure: PERCUTANEOUS CORONARY STENT INTERVENTION (PCI-S);  Surgeon: Blane Ohara, MD;  Location: [facility] CATH LAB;  Service: Cardiovascular;  Laterality: N/A;  . PLEURAL EFFUSION DRAINAGE Right 03/01/2017   Procedure: DRAINAGE OF PLEURAL EFFUSION;  Surgeon: Gaye Pollack, MD;  Location: [place];  Service: Thoracic;  Laterality: Right;  . RIGHT/LEFT HEART CATH AND CORONARY/GRAFT ANGIOGRAPHY N/A 05/15/2017   Procedure: RIGHT/LEFT HEART CATH AND CORONARY/GRAFT ANGIOGRAPHY;  Surgeon: Larey Dresser, MD;  Location: [place] CV LAB;  Service: Cardiovascular;  Laterality: N/A;  . TALC PLEURODESIS Right 03/01/2017   Procedure: Pietro Cassis;  Surgeon: Gaye Pollack, MD;  Location: [place];  Service: Thoracic;  Laterality: Right;  . TOTAL HIP ARTHROPLASTY Left 01/21/2013   Procedure: TOTAL HIP ARTHROPLASTY ANTERIOR APPROACH;  Surgeon: Mauri Pole, MD;  Location: [place];  Service: Orthopedics;  Laterality: Left;  Marland Kitchen VIDEO ASSISTED THORACOSCOPY Right 03/01/2017   Procedure: VIDEO ASSISTED THORACOSCOPY WITH BIOPSIES;  Surgeon: Gaye Pollack, MD;  Location: [place];  Service: Thoracic;  Laterality: Right;  Marland Kitchen VIDEO BRONCHOSCOPY N/A 11/17/2015   Procedure: VIDEO BRONCHOSCOPY WITH FLUORO;  Surgeon: Rigoberto Noel, MD;  Location: [place];  Service: Cardiopulmonary;  Laterality: N/A;    Outpatient Encounter Medications as of 12/19/2017  Medication Sig  . acetaminophen (TYLENOL) 500 MG tablet Take 500 mg by mouth every 6 (six) hours as needed for mild pain.  Marland Kitchen aspirin EC 81 MG tablet Take 81 mg by mouth daily before breakfast.   .  budesonide-formoterol (SYMBICORT) 160-4.5 MCG/ACT inhaler Inhale 2 puffs into the lungs 2 (two) times daily.  . carvedilol (COREG) 6.25 MG tablet TAKE 1 AND 1/2 TABLETS BY MOUTH TWICE DAILY.  Marland Kitchen clopidogrel (PLAVIX) 75 MG  tablet TAKE ONE TABLET BY MOUTH DAILY.  Marland Kitchen Ferrous Sulfate (IRON) 28 MG TABS Take 28 mg by mouth daily.  . furosemide (LASIX) 20 MG tablet Take 1 tablet (20 mg total) by mouth 2 (two) times daily.  . GuaiFENesin (MUCUS RELIEF ADULT PO) Take 2 capsules by mouth 3 (three) times daily.  Marland Kitchen ipratropium-albuterol (DUONEB) 0.5-2.5 (3) MG/3ML SOLN Take 3 mLs by nebulization 4 (four) times daily.  . methylPREDNISolone (MEDROL) 4 MG tablet Take 1 tablet (4 mg total) by mouth every other day.  . Multiple Vitamins-Minerals (CENTRUM SILVER ADULT 50+) TABS Take 1 tablet by mouth daily with lunch.   Marland Kitchen NITROSTAT 0.4 MG SL tablet PLACE 1 TAB UNDER TONGUE EVERY 5 MIN IF NEEDED FOR CHEST PAIN. MAY USE 3 TIMES.NO RELIEF CALL 911.  . omeprazole (PRILOSEC) 20 MG capsule TAKE ONE CAPSULE BY MOUTH DAILY.  . Probiotic Product (FLORAJEN3 PO) Take 1 tablet by mouth daily.  . sacubitril-valsartan (ENTRESTO) 24-26 MG Take 1 tablet by mouth 2 (two) times daily.  . simvastatin (ZOCOR) 80 MG tablet Take 0.5 tablets (40 mg total) by mouth at bedtime.  Marland Kitchen spironolactone (ALDACTONE) 25 MG tablet TAKE 1 TABLET BY MOUTH DAILY.  . VENTOLIN HFA 108 (90 Base) MCG/ACT inhaler INHALE 2 PUFFS BY MOUTH EVERY 4 TO 6 HOURS AS NEEDED FOR WHEEZING.  . [DISCONTINUED] amoxicillin (AMOXIL) 500 MG capsule Take 1 capsule (500 mg total) by mouth 3 (three) times daily.  . [DISCONTINUED] levofloxacin (LEVAQUIN) 750 MG tablet Take 1 tablet (750 mg total) by mouth daily.   No facility-administered encounter medications on file as of 12/19/2017.     Allergies  Allergen Reactions  . Neomycin Hives    Immunization History  Administered Date(s) Administered  . H1N1 08/06/2008  . Influenza Split 06/24/2013  . Influenza,inj,Quad PF,6+  Mos 06/15/2014, 05/31/2015, 05/09/2016, 06/06/2017  . Influenza-Unspecified 05/27/2012  . Pneumococcal Conjugate-13 08/18/2014  . Pneumococcal Polysaccharide-23 03/27/2012  . Zoster 07/27/2008    Current Medications, Allergies, Past Medical History, Past Surgical History, Family History, and Social History were reviewed in [employer] record.   Review of Systems             All symptoms NEG except where BOLDED >>  Constitutional:  F/C/S, fatigue, anorexia, unexpected weight change. HEENT:  HA, visual changes, hearing loss, earache, nasal symptoms, sore throat, mouth sores, hoarseness. Resp:  cough, sputum, hemoptysis; SOB, tightness, wheezing. Cardio:  CP, palpit, DOE, orthopnea, edema. GI:  N/V/D/C, blood in stool; reflux, abd pain, distention, gas. GU:  dysuria, freq, urgency, hematuria, flank pain, voiding difficulty. MS:  joint pain, swelling, tenderness, decr ROM; neck pain, back pain, etc. Neuro:  HA, tremors, seizures, dizziness, syncope, weakness, numbness, gait abn. Skin:  suspicious lesions or skin rash. Heme:  adenopathy, bruising, bleeding. Psyche:  confusion, agitation, sleep disturbance, hallucinations, anxiety, depression suicidal.   Objective:   Physical Exam       Vital Signs:  Reviewed...   General:  WD, WN, 77 y/o WM in NAD; alert & oriented; pleasant & cooperative... HEENT:  [place]/AT; Conjunctiva- pink, Sclera- nonicteric, EOM-wnl, PERRLA, EACs-clear, TMs-wnl; NOSE-clear; THROAT-clear & wnl.  Neck:  Supple w/ fair ROM; no JVD; normal carotid impulses w/ faint bruits; no thyromegaly or nodules palpated; no lymphadenopathy.  Chest:  decr BS right base & clear w/o w/r/r & no signs of consolid. Heart:  Regular Rhythm; norm S1 & S2 w/ Gr1/6 SEM without rubs or gallops detected. Abdomen:  Soft & nontender- no guarding  or rebound; normal bowel sounds; no organomegaly or masses palpated. Ext:  Sl decr ROM; without deformities +arthritic changes; no  varicose veins, +venous insuffic, tr edema;  Pulses decr w/o bruits. Neuro:  CNs II-XII intact; motor testing normal; sensory testing normal; gait normal & balance OK. Derm:  No lesions noted; no rash etc. Lymph:  No cervical, supraclavicular, axillary, or inguinal adenopathy palpated.   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     ASPVD- s/p AAA stent graft 11/2012, mod bilat carotid occlusive dis (asymptomatic) w/ CDopplers showing ~50-60% bilat ICAstenoses>  Stable & seen by [facility] 01/2016- plans yearly f/u CT Abd and CDopplers    MEDICAL issues>  HBP, HL, GERD/ Divertic/ Polyps, CKD-stage3, Anemia & thrombocytopenia>  On Simva80-1/2Qd, Prilosec20/d, Fe/ MVI/ etc...  PLAN>>  02/29/16>   Problem list as above w/ signif cardiac, pulmonary, & post chemoradiation changes;  From the pulm standpoint- he feels the BREO is helping & hasn't needed the rescue inhaler very often, continue same;  He is on a long PRED taper per DrManning for poss radiation fibrosis RUL, Sed rate is 53, continue this taper & careful w/ sweets/ sugar etc;  From the cardiac standpoint- he has signif underlying dis w/ Lmain stent, s/p CABG 20 yrs ago, prob ischemic cardiomyopathy w/ worsening 2DEcho recently w/ 30-35% EF + HK&AK seen, BNP=1053, Cards plans ischemic work up when able, in the meanwhile he has improved w/  diuresis but [place] w/ mild RI & HCO3=36... REC- add DIAMOX-ER 500 one tab daily at 4pm, continue his Lasix40 Qam, increase water intake, watch weights... We plan ROV 35monthrecheck... 04/04/16>   Discussed w/ Stokes-- continue the Levaquin til gone, use Tylenol for fever or pain; continue the Breo daily & Ventolin-HFA as needed; wean off the Diamox at this time, continue Lasix40, no salt, etc; he needs to gradually increase his exercise/ mobility; we will plan ROV w/ labs in 26mo 06/06/16>   He is feeling better, performance status improving, CXR shows progressive changes in RUL s/p chemoradiation; CT=>PET scans w/ vol loss & persistent hypermetabolism/ evolving changes in RUL & right hilum plus he has a mod large right effusion; prev thoracentesis 01/2016 in hosp was transudative- I believe he would benefit from further eval/ repeat thoracentesis to recheck this fluid; we will set this up via IR & ask them to drain the fluid as much as poss, send it for Cytology, cell ct & diff, TProt/ LDH/ Gluc => 1.2L removed, prob exudate, NEG cytology, & we will follow... 08/06/16>   BiYariels clinically stable, no clear evid of recurrent dis & followed very closely by DrMohamed; Pulm stable on Symbicort Bid, Albut rescue prn, and his exercise- continue same 7 we plan rov recheck in 83m51mo12/18>    BilDaeshon doing satis on Symbicort160-2spBid & Albut rescue inhaler prn; he states that  he is active, energy is good, & that he's getting stronger & better (good performance status); I suspect that he has residual cancer in the right chest but it appears slowly progressive (w/ sl incr right effusion & subcarinal LN) and DrMohamed to review status & decide regarding additional therapy at this point vs continued observation...  01/02/17>   Juvon is improved but it's been a slow process; he is at high risk for cancer progression & followed closely by DrMohamed who plans f/u CT & recheck in June;  Continue current meds, add Guaifenesin  2425m/d + water to aid in mucus production... We will recheck in 267mo review his f/u scans planned by Oncology. 02/04/17>   SEE ABOVE - Rx Pred taper & Levaquin=> no better; R-Thoracentesis by IR w/ only 280cc exudative loculated fluid removed, cytology- NEG; Limited options at this point- Rx w/ Augmentin empirically, add NEBs w/ Albuterol Tid, continue GFN 240085m;  We will check 2DEcho and refer to TCTS to consider further Rx options for right effusion... 03/13/17>   The worsening LVF w/ EF 20-82-80% certainly playing a roll in his unrespolved dyspnea=> refer to CHF clinic for active management; in the interim continue NEBs, Symbicort, Mucinex, etc.. 04/24/17>   BilJohsuapears stable post T-surg & improved w/ CHF-clinic Rx per DrMcLean;  OK 2018 FLU shot today, and continue pulm rx w/ Albut NEBS Tid, Symbicort160-2spBid, GFN400-2Bid;  Call for any questions and we plan f/u visit in 84mo94mo/29/18>   As above- we discussed treating THIS COPD exac w/ BacterimDS & PRED taper; he is to continue w/ his NEBS, Symbicort, GFN and we plan recheck in 6wks time 08/08/17>   As noted above BillRush Landmarkhaving a rough time lately>  He had the right VATS surg & talc pleurodesis 02/2017;  He has a severe ischemic cardiomyopathy w/ EF= 20-25% & now followed closely by DrMcLean w/ freq (Q84mo)274moits in the CHF clinic for medication titration;  He has severe obstructive lung dis w/ FEV1=1.25 (40%) in 01/2016 and freq exacerbations as documented above;  We reviewed recommendations for NEBS w/ DUONEB Qid, Symbicort160-2spBid, GFN400-2tabs Tid w/ fluids;  We are starting a 10d course of AUGMENTIN875Bid and a long slow MEDROL taper w/ planned recheck in 6wks;  He will continue to work on chest physiotherapy & bronchial mucus hygiene 09/19/17>   Bill required mult antibiotics to get over his recent upper resp infection=> ZPak, then Bacterim, then Augmentin, then Omnicef; he remains on Medrol4mgQo62m is asked to stay on this; he requested  follow up labs (as above); reminded to stay as active as poss w/ a regular exercise program...  12/19/17>   Willim is remarkably stable w/ his severe pulmonary & cardiac diseases;  We reviewed his medication regimen & rec to continue same;  We reviewed diet & exercise prescriptions;  We plan rov recheck 74mo w/65mo.   Plan:      Patient's Medications  New Prescriptions   No medications on file  Previous Medications   ACETAMINOPHEN (TYLENOL) 500 MG TABLET    Take 500 mg by mouth every 6 (six) hours as needed for mild pain.   ASPIRIN EC 81 MG TABLET    Take 81 mg by mouth daily before breakfast.    BUDESONIDE-FORMOTEROL (SYMBICORT) 160-4.5 MCG/ACT INHALER    Inhale 2 puffs into the lungs 2 (two) times daily.   CARVEDILOL (COREG) 6.25 MG TABLET    TAKE 1 AND 1/2 TABLETS BY MOUTH TWICE DAILY.  CLOPIDOGREL (PLAVIX) 75 MG TABLET    TAKE ONE TABLET BY MOUTH DAILY.   FERROUS SULFATE (IRON) 28 MG TABS    Take 28 mg by mouth daily.   FUROSEMIDE (LASIX) 20 MG TABLET    Take 1 tablet (20 mg total) by mouth 2 (two) times daily.   GUAIFENESIN (MUCUS RELIEF ADULT PO)    Take 2 capsules by mouth 3 (three) times daily.   IPRATROPIUM-ALBUTEROL (DUONEB) 0.5-2.5 (3) MG/3ML SOLN    Take 3 mLs by nebulization 4 (four) times daily.   METHYLPREDNISOLONE (MEDROL) 4 MG TABLET    Take 1 tablet (4 mg total) by mouth every other day.   MULTIPLE VITAMINS-MINERALS (CENTRUM SILVER ADULT 50+) TABS    Take 1 tablet by mouth daily with lunch.    NITROSTAT 0.4 MG SL TABLET    PLACE 1 TAB UNDER TONGUE EVERY 5 MIN IF NEEDED FOR CHEST PAIN. MAY USE 3 TIMES.NO RELIEF CALL 911.   OMEPRAZOLE (PRILOSEC) 20 MG CAPSULE    TAKE ONE CAPSULE BY MOUTH DAILY.   PROBIOTIC PRODUCT (FLORAJEN3 PO)    Take 1 tablet by mouth daily.   SACUBITRIL-VALSARTAN (ENTRESTO) 24-26 MG    Take 1 tablet by mouth 2 (two) times daily.   SIMVASTATIN (ZOCOR) 80 MG TABLET    Take 0.5 tablets (40 mg total) by mouth at bedtime.   SPIRONOLACTONE (ALDACTONE) 25 MG  TABLET    TAKE 1 TABLET BY MOUTH DAILY.   VENTOLIN HFA 108 (90 BASE) MCG/ACT INHALER    INHALE 2 PUFFS BY MOUTH EVERY 4 TO 6 HOURS AS NEEDED FOR WHEEZING.  Modified Medications   No medications on file  Discontinued Medications   AMOXICILLIN (AMOXIL) 500 MG CAPSULE    Take 1 capsule (500 mg total) by mouth 3 (three) times daily.   LEVOFLOXACIN (LEVAQUIN) 750 MG TABLET    Take 1 tablet (750 mg total) by mouth daily.

## 2017-12-25 DIAGNOSIS — J449 Chronic obstructive pulmonary disease, unspecified: Secondary | ICD-10-CM | POA: Diagnosis not present

## 2018-01-05 ENCOUNTER — Other Ambulatory Visit (HOSPITAL_COMMUNITY): Payer: Self-pay | Admitting: Cardiology

## 2018-01-06 ENCOUNTER — Encounter (HOSPITAL_COMMUNITY): Payer: Self-pay | Admitting: Cardiology

## 2018-01-06 ENCOUNTER — Ambulatory Visit (HOSPITAL_COMMUNITY)
Admission: RE | Admit: 2018-01-06 | Discharge: 2018-01-06 | Disposition: A | Discharge status: Home / Self Care | Payer: Medicare Other | Source: Ambulatory Visit | Attending: Cardiology | Admitting: Cardiology

## 2018-01-06 VITALS — BP 120/60 | HR 89 | Wt 188.8 lb

## 2018-01-06 DIAGNOSIS — Z951 Presence of aortocoronary bypass graft: Secondary | ICD-10-CM | POA: Insufficient documentation

## 2018-01-06 DIAGNOSIS — Z801 Family history of malignant neoplasm of trachea, bronchus and lung: Secondary | ICD-10-CM | POA: Insufficient documentation

## 2018-01-06 DIAGNOSIS — I2510 Atherosclerotic heart disease of native coronary artery without angina pectoris: Secondary | ICD-10-CM

## 2018-01-06 DIAGNOSIS — Z87891 Personal history of nicotine dependence: Secondary | ICD-10-CM | POA: Insufficient documentation

## 2018-01-06 DIAGNOSIS — J449 Chronic obstructive pulmonary disease, unspecified: Secondary | ICD-10-CM | POA: Insufficient documentation

## 2018-01-06 DIAGNOSIS — N182 Chronic kidney disease, stage 2 (mild): Secondary | ICD-10-CM | POA: Diagnosis not present

## 2018-01-06 DIAGNOSIS — I255 Ischemic cardiomyopathy: Secondary | ICD-10-CM | POA: Diagnosis not present

## 2018-01-06 DIAGNOSIS — Z79899 Other long term (current) drug therapy: Secondary | ICD-10-CM | POA: Diagnosis not present

## 2018-01-06 DIAGNOSIS — Z95828 Presence of other vascular implants and grafts: Secondary | ICD-10-CM | POA: Insufficient documentation

## 2018-01-06 DIAGNOSIS — Z7902 Long term (current) use of antithrombotics/antiplatelets: Secondary | ICD-10-CM | POA: Insufficient documentation

## 2018-01-06 DIAGNOSIS — Z7982 Long term (current) use of aspirin: Secondary | ICD-10-CM | POA: Diagnosis not present

## 2018-01-06 DIAGNOSIS — Z8679 Personal history of other diseases of the circulatory system: Secondary | ICD-10-CM | POA: Diagnosis not present

## 2018-01-06 DIAGNOSIS — Z8261 Family history of arthritis: Secondary | ICD-10-CM | POA: Diagnosis not present

## 2018-01-06 DIAGNOSIS — J90 Pleural effusion, not elsewhere classified: Secondary | ICD-10-CM | POA: Insufficient documentation

## 2018-01-06 DIAGNOSIS — I5042 Chronic combined systolic (congestive) and diastolic (congestive) heart failure: Secondary | ICD-10-CM | POA: Diagnosis not present

## 2018-01-06 DIAGNOSIS — I130 Hypertensive heart and chronic kidney disease with heart failure and stage 1 through stage 4 chronic kidney disease, or unspecified chronic kidney disease: Secondary | ICD-10-CM | POA: Diagnosis not present

## 2018-01-06 DIAGNOSIS — Z7951 Long term (current) use of inhaled steroids: Secondary | ICD-10-CM | POA: Diagnosis not present

## 2018-01-06 DIAGNOSIS — Z820 Family history of epilepsy and other diseases of the nervous system: Secondary | ICD-10-CM | POA: Insufficient documentation

## 2018-01-06 DIAGNOSIS — Z9221 Personal history of antineoplastic chemotherapy: Secondary | ICD-10-CM | POA: Diagnosis not present

## 2018-01-06 DIAGNOSIS — I4510 Unspecified right bundle-branch block: Secondary | ICD-10-CM | POA: Diagnosis not present

## 2018-01-06 DIAGNOSIS — I25810 Atherosclerosis of coronary artery bypass graft(s) without angina pectoris: Secondary | ICD-10-CM | POA: Insufficient documentation

## 2018-01-06 DIAGNOSIS — E785 Hyperlipidemia, unspecified: Secondary | ICD-10-CM | POA: Diagnosis not present

## 2018-01-06 DIAGNOSIS — Z85118 Personal history of other malignant neoplasm of bronchus and lung: Secondary | ICD-10-CM | POA: Insufficient documentation

## 2018-01-06 DIAGNOSIS — Z923 Personal history of irradiation: Secondary | ICD-10-CM | POA: Diagnosis not present

## 2018-01-06 DIAGNOSIS — I5022 Chronic systolic (congestive) heart failure: Secondary | ICD-10-CM | POA: Insufficient documentation

## 2018-01-06 DIAGNOSIS — Z8249 Family history of ischemic heart disease and other diseases of the circulatory system: Secondary | ICD-10-CM | POA: Insufficient documentation

## 2018-01-06 MED ORDER — CARVEDILOL 12.5 MG PO TABS: 12.5000 mg | ORAL_TABLET | Freq: Two times a day (BID) | ORAL | 3 refills | Status: DC

## 2018-01-06 NOTE — Progress Notes (Signed)
PCP: Dr. Wolfgang Phoenix Pulmonary: Dr. Lenna Gilford HF Cardiology: Dr. Aundra Dubin  77 y.o. with history of COPD, CAD s/p CABG in 1996 and PCI to dLM and SVG-D in 7/15, ischemic cardiomyopathy, and non-small cell lung cancer presents for followup of CHF and dyspnea.  He was initially referred to [place] by Dr. Lenna Gilford after fall in EF and worsening dyspnea was noted.   Patient reports that his initial symptoms prior to CABG in 1996 were dyspnea.  He has never had any worrisome chest pain.  He did well initially after CABG and was golfing regularly and walking 2-3 miles/day up until around 3/17.  He had one episode of increased dyspnea in 2015 and had cath with PCI to distal LM and SVG-D.  In 3/17, he was diagnosed with NSCLC and was treated with radiation and chemotherapy.  Since then, he has had progressive exertional dyspnea.  He was noted on echo in 7/18 to have EF down to 20-25%.    RHC/LHC was done in 9/18, showing occluded SVG-small D, patent LIMA-LAD and SVG-PDA, 50% shelf-like stenosis distal left main (no intervention), filling pressure not elevated and cardiac output preserved.   I increased his Entresto to 49/51 bid but BP dropped and he had orthostatic symptoms, so I decreased it back to 24/26 bid.   He is stable symptomatically.  Overall feeling good. He walks about 5 days/week for 2 miles at a time. He mows and weed-eats.  No significant exertional dyspnea. No lightheadedness.  Plays golf for 9 holes and is fatigued when finished. No orthopnea/PND.  No chest pain.   Labs (9/17): LDL 90 Labs (7/18): K 3.5, creatinine 1.28, hgb 10.7 Labs (8/18): LDL 68 Labs (9/18): K 4.1, creatinine 1.2, hgb 11.1, plts 98 K Labs (10/18): K 4.1, creatinine 1.5 Labs (1/19): K 4.3, creatinine 1.54, LDL 80, HDL 49, LFTs normal, TSH normal Labs (4/19): K 4.3, creatinine 1.52, hgb 11.8  PMH: 1. COPD: PFTs (6/17) with severe obstructive airways disease.  2. AAA: s/p repair with stent graft.  3. Non-small cell lung cancer: Stage  IIIA, diagnosed 3/17.  He had radiation as well as chemotherapy with carboplatin and paclitaxel.   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 PNA in 6/17 6. Recurrent right pleural effusion: VATS with talc pleurodesis on right.  No malignant cells noted.  7. HTN 8. H/o THR 9. CAD: CABG 1996 with SVG-D, SVG-PDA, LIMA-LAD.   - LHC (7/15): Totally occluded RCA and LAD.  Severe distal LM stenosis.  Patent SVG-PDA, patent LIMA-LAD.  Severe disease SVG-D.  Patient had DES to distal left main and staged PCI of SVG-D.   - Cardiolite (7/17): EF 30%, prior infarction with no ischemia.  - LHC (9/18): occluded SVG-small D, patent LIMA-LAD and SVG-PDA, 50% shelf-like stenosis distal left main (no intervention).  10. Chronic systolic CHF: Ischemic cardiomyopathy.   - Echo (6/17): EF 30-35%. - Echo (7/18): EF 20-25%, moderate RV dilation with mildly decreased RV systolic function.  - RHC (9/18): mean RA 3, PA 28/9 mean 18, mean PCWP 10, CI 4.05.   Social History   Socioeconomic History  . Marital status: Married    Spouse name: Not on file  . Number of children: 1  . Years of education: Not on file  . Highest education level: Not on file  Occupational History  . Occupation: Retired    Comment: [employer]  . Financial resource strain: Not on file  . Food insecurity:    Worry: Not on file  Inability: Not on file  . Transportation needs:    Medical: Not on file    Non-medical: Not on file  Tobacco Use  . Smoking status: Former Smoker    Packs/day: 1.50    Years: 30.00    Pack years: 45.00    Types: Cigarettes    Start date: 12/01/1956    Last attempt to quit: 01/08/1995    Years since quitting: 23.0  . Smokeless tobacco: Never Used  Substance and Sexual Activity  . Alcohol use: No    Alcohol/week: 0.0 oz    Frequency: Never    Comment: 03/18/2014 "no alacohol since 1996"  . Drug use: No  . Sexual activity: Never  Lifestyle  . Physical activity:    Days per week: Not on file     Minutes per session: Not on file  . Stress: Not on file  Relationships  . Social connections:    Talks on phone: Not on file    Gets together: Not on file    Attends religious service: Not on file    Active member of club or organization: Not on file    Attends meetings of clubs or organizations: Not on file    Relationship status: Not on file  . Intimate partner violence:    Fear of current or ex partner: Not on file    Emotionally abused: Not on file    Physically abused: Not on file    Forced sexual activity: Not on file  Other Topics Concern  . Not on file  Social History Narrative  . Not on file   Family History  Problem Relation Age of Onset  . Heart disease Father   . Cancer Father        Lung  . Arthritis Mother   . Parkinsonism Mother   . Arthritis Sister        Brother with rheumatoid arthritis  . Hypertension Brother   . Colon cancer Neg Hx   . Colon polyps Neg Hx    ROS: All systems reviewed and negative except as per HPI.   Current Outpatient Medications  Medication Sig Dispense Refill  . acetaminophen (TYLENOL) 500 MG tablet Take 500 mg by mouth every 6 (six) hours as needed for mild pain.    Marland Kitchen aspirin EC 81 MG tablet Take 81 mg by mouth daily before breakfast.     . budesonide-formoterol (SYMBICORT) 160-4.5 MCG/ACT inhaler Inhale 2 puffs into the lungs 2 (two) times daily.    . carvedilol (COREG) 12.5 MG tablet Take 1 tablet (12.5 mg total) by mouth 2 (two) times daily with a meal. 60 tablet 3  . clopidogrel (PLAVIX) 75 MG tablet TAKE ONE TABLET BY MOUTH DAILY. 30 tablet 0  . Ferrous Sulfate (IRON) 28 MG TABS Take 28 mg by mouth daily.    . furosemide (LASIX) 20 MG tablet Take 1 tablet (20 mg total) by mouth 2 (two) times daily. 60 tablet 3  . GuaiFENesin (MUCUS RELIEF ADULT PO) Take 2 capsules by mouth 3 (three) times daily.    Marland Kitchen ipratropium-albuterol (DUONEB) 0.5-2.5 (3) MG/3ML SOLN Take 3 mLs by nebulization 4 (four) times daily. 360 mL 5  .  methylPREDNISolone (MEDROL) 4 MG tablet Take 1 tablet (4 mg total) by mouth every other day. 15 tablet 1  . Multiple Vitamins-Minerals (CENTRUM SILVER ADULT 50+) TABS Take 1 tablet by mouth daily with lunch.     Marland Kitchen NITROSTAT 0.4 MG SL tablet PLACE 1 TAB UNDER TONGUE EVERY  5 MIN IF NEEDED FOR CHEST PAIN. MAY USE 3 TIMES.NO RELIEF CALL 911. 25 tablet 1  . omeprazole (PRILOSEC) 20 MG capsule TAKE ONE CAPSULE BY MOUTH DAILY. 90 capsule 0  . Probiotic Product (FLORAJEN3 PO) Take 1 tablet by mouth daily.    . sacubitril-valsartan (ENTRESTO) 24-26 MG Take 1 tablet by mouth 2 (two) times daily. 60 tablet 3  . simvastatin (ZOCOR) 80 MG tablet Take 0.5 tablets (40 mg total) by mouth at bedtime. 15 tablet 5  . spironolactone (ALDACTONE) 25 MG tablet TAKE 1 TABLET BY MOUTH DAILY. 30 tablet 2  . VENTOLIN HFA 108 (90 Base) MCG/ACT inhaler INHALE 2 PUFFS BY MOUTH EVERY 4 TO 6 HOURS AS NEEDED FOR WHEEZING. 18 g 0   No current facility-administered medications for this encounter.    BP 120/60   Pulse 89   Wt 188 lb 12.8 oz (85.6 kg)   SpO2 98%   BMI 27.09 kg/m  General: NAD Neck: No JVD, no thyromegaly or thyroid nodule.  Lungs: Occasional rhonchi CV: Nondisplaced PMI.  Heart regular S1/S2, no S3/S4, no murmur.  No peripheral edema.  No carotid bruit.  Normal pedal pulses.  Abdomen: Soft, nontender, no hepatosplenomegaly, no distention.  Skin: Intact without lesions or rashes.  Neurologic: Alert and oriented x 3.  Psych: Normal affect. Extremities: No clubbing or cyanosis.  HEENT: Normal.   Assessment/Plan: 1. Chronic systolic CHF: Ischemic cardiomyopathy.  He has had a steady fall in EF on imaging studies, most recently had echo in 7/18 with EF 20-25%.  Currently, NYHA class II symptoms and feeling good overall.  He is not volume overloaded on exam.  I suspect a significant portion of his dyspnea is due to COPD.  He did not tolerate increase in Entresto to 49/51 bid.  - Continue Entresto 24/26 bid.  -  Increase Coreg to 12.5 mg bid.   - Continue Lasix 20 mg bid.   - Continue spironolactone 25 mg daily.  - Repeat echo in 3 months, will be ICD candidate if EF remains low. He has a RBBB so not good CRT candidate.  2. CAD: s/p CABG then PCI 7/15 to distal LM and SVG-D.  Anginal equivalent in the past appears to have been dyspnea, he has never had significant chest pain.  LHC (9/18) showed patent LIMA-LAD and SVG-PDA; occluded SVG-D; 50% shelf-like LM stenosis - Continue ASA 81, Plavix, and simvastatin.  3. COPD: Severe COPD by PFTs in 6/17.  Based on the last RHC/LHC, I suspect that COPD plays a major role in his ongoing dyspnea.   4. Hyperlipidemia: Lipids acceptable in 1/19.  5. NSCLC: Follows with Dr. Earlie Server.  He has finished chemo/radiation and will get scans to assess for residual disease. He does not appear to have had any significantly cardiotoxic agents with chemotherapy.  6. Exertional dyspnea: With normal filling pressures on RHC, I think that COPD plays a major role in his dyspnea. This actually seems better recently.  7. Right pleural effusion: This has been chronic. S/p VATS.   Followup in 3 months with echo.   Loralie Champagne 01/06/2018

## 2018-01-06 NOTE — Patient Instructions (Signed)
Increase Carvedilol 12.5 mg (1 tab), twice a day  Your physician has requested that you have an echocardiogram. Echocardiography is a painless test that uses sound waves to create images of your heart. It provides your doctor with information about the size and shape of your heart and how well your heart's chambers and valves are working. This procedure takes approximately one hour. There are no restrictions for this procedure.  Your physician recommends that you schedule a follow-up appointment in: 3 months with Dr. Aundra Dubin  an a echocardiogram

## 2018-01-15 DIAGNOSIS — J449 Chronic obstructive pulmonary disease, unspecified: Secondary | ICD-10-CM | POA: Diagnosis not present

## 2018-01-25 ENCOUNTER — Other Ambulatory Visit: Payer: Self-pay | Admitting: Family Medicine

## 2018-01-27 ENCOUNTER — Telehealth: Payer: Self-pay | Admitting: Pulmonary Disease

## 2018-01-27 MED ORDER — METHYLPREDNISOLONE 4 MG PO TABS: 4.0000 mg | ORAL_TABLET | ORAL | 1 refills | Status: DC

## 2018-01-27 NOTE — Telephone Encounter (Signed)
Pt is returning call. Cb is [phone].

## 2018-01-27 NOTE — Telephone Encounter (Signed)
Called patient, unable to reach. Left message to give us a call back.  

## 2018-01-27 NOTE — Telephone Encounter (Signed)
Spoke with pt and advised rx sent to pharmacy. Nothing further is needed.    GFN400-2Bid;  Call for any questions and we plan f/u visit in 3mo. 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%) in 01/2016 and freq exacerbations as documented above;  We reviewed recommendations for NEBS w/ DUONEB Qid, Symbicort160-2spBid, GFN400-2tabs Tid w/ fluids;  We are starting a 10d course of AUGMENTIN875Bid and a long slow MEDROL taper w/ planned recheck in 6wks;  He will continue to work on chest physiotherapy & bronchial mucus hygiene 09/19/17>   Paul Sandoval required mult antibiotics to get over his recent upper resp infection=> ZPak, then Bacterim, then Augmentin, then Omnicef; he remains on Medrol4mg Qod & is asked to stay on this; he requested follow up labs (as above); reminded to stay as active as poss w/ a regular exercise program...    Plan:

## 2018-02-05 ENCOUNTER — Other Ambulatory Visit (HOSPITAL_COMMUNITY): Payer: Self-pay | Admitting: Cardiology

## 2018-02-05 ENCOUNTER — Other Ambulatory Visit: Payer: Self-pay | Admitting: Pulmonary Disease

## 2018-02-24 ENCOUNTER — Other Ambulatory Visit: Payer: Self-pay | Admitting: Cardiovascular Disease

## 2018-02-24 ENCOUNTER — Other Ambulatory Visit: Payer: Self-pay | Admitting: Internal Medicine

## 2018-02-24 DIAGNOSIS — J449 Chronic obstructive pulmonary disease, unspecified: Secondary | ICD-10-CM | POA: Diagnosis not present

## 2018-02-24 DIAGNOSIS — C3411 Malignant neoplasm of upper lobe, right bronchus or lung: Secondary | ICD-10-CM

## 2018-02-26 ENCOUNTER — Telehealth: Payer: Self-pay | Admitting: Pulmonary Disease

## 2018-02-26 MED ORDER — METHYLPREDNISOLONE 4 MG PO TABS: 4.0000 mg | ORAL_TABLET | ORAL | 0 refills | Status: DC

## 2018-02-26 NOTE — Telephone Encounter (Signed)
Called and spoke to pt.  Pt is requesting refill on Medrol 4mg  QOD #20 to get him by until his f/u on 04/03/18. Rx for Medrol 4mg  #20 has been sent to preferred pharmacy. Nothing further is needed.      GFN400-2Bid; Call for any questions and we plan f/u visit in 71mo. 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%) in 01/2016 and freq exacerbations as documented above; We reviewed recommendations for NEBS w/ DUONEB Qid, Symbicort160-2spBid, GFN400-2tabs Tid w/ fluids; We are starting a 10d course of AUGMENTIN875Bid and a long slow MEDROL taper w/ planned recheck in 6wks; He will continue to work on chest physiotherapy &bronchial mucus hygiene 09/19/17>[name] required mult antibiotics to get over his recent upper resp infection=> ZPak, then Bacterim, then Augmentin, then Omnicef; he remains on Medrol4mg Qod & is asked to stay on this; he requested follow up labs (as above); reminded to stay as active as poss w/ a regular exercise program..Marland Kitchen

## 2018-03-06 ENCOUNTER — Other Ambulatory Visit: Payer: Self-pay | Admitting: Family Medicine

## 2018-03-06 ENCOUNTER — Other Ambulatory Visit: Payer: Self-pay | Admitting: Pulmonary Disease

## 2018-03-17 ENCOUNTER — Ambulatory Visit: Payer: Medicare Other | Admitting: Pulmonary Disease

## 2018-03-20 ENCOUNTER — Ambulatory Visit: Payer: Medicare Other | Admitting: Pulmonary Disease

## 2018-03-27 ENCOUNTER — Other Ambulatory Visit: Payer: Self-pay | Admitting: Cardiovascular Disease

## 2018-03-27 DIAGNOSIS — J449 Chronic obstructive pulmonary disease, unspecified: Secondary | ICD-10-CM | POA: Diagnosis not present

## 2018-03-27 DIAGNOSIS — C3411 Malignant neoplasm of upper lobe, right bronchus or lung: Secondary | ICD-10-CM

## 2018-04-03 ENCOUNTER — Ambulatory Visit (INDEPENDENT_AMBULATORY_CARE_PROVIDER_SITE_OTHER)
Admission: RE | Admit: 2018-04-03 | Discharge: 2018-04-03 | Disposition: A | Discharge status: Home / Self Care | Payer: Medicare Other | Source: Ambulatory Visit | Attending: Pulmonary Disease | Admitting: Pulmonary Disease

## 2018-04-03 ENCOUNTER — Encounter: Payer: Self-pay | Admitting: Pulmonary Disease

## 2018-04-03 ENCOUNTER — Ambulatory Visit: Payer: Medicare Other | Admitting: Pulmonary Disease

## 2018-04-03 VITALS — BP 112/62 | HR 71 | Temp 97.4°F | Ht 70.0 in | Wt 188.4 lb

## 2018-04-03 DIAGNOSIS — J449 Chronic obstructive pulmonary disease, unspecified: Secondary | ICD-10-CM

## 2018-04-03 DIAGNOSIS — I10 Essential (primary) hypertension: Secondary | ICD-10-CM

## 2018-04-03 DIAGNOSIS — Z95828 Presence of other vascular implants and grafts: Secondary | ICD-10-CM

## 2018-04-03 DIAGNOSIS — Z951 Presence of aortocoronary bypass graft: Secondary | ICD-10-CM

## 2018-04-03 DIAGNOSIS — J4489 Other specified chronic obstructive pulmonary disease: Secondary | ICD-10-CM

## 2018-04-03 DIAGNOSIS — I714 Abdominal aortic aneurysm, without rupture, unspecified: Secondary | ICD-10-CM

## 2018-04-03 DIAGNOSIS — I2510 Atherosclerotic heart disease of native coronary artery without angina pectoris: Secondary | ICD-10-CM

## 2018-04-03 DIAGNOSIS — N183 Chronic kidney disease, stage 3 unspecified: Secondary | ICD-10-CM

## 2018-04-03 DIAGNOSIS — I255 Ischemic cardiomyopathy: Secondary | ICD-10-CM

## 2018-04-03 DIAGNOSIS — C3411 Malignant neoplasm of upper lobe, right bronchus or lung: Secondary | ICD-10-CM | POA: Diagnosis not present

## 2018-04-03 DIAGNOSIS — E785 Hyperlipidemia, unspecified: Secondary | ICD-10-CM

## 2018-04-03 DIAGNOSIS — J90 Pleural effusion, not elsewhere classified: Secondary | ICD-10-CM | POA: Diagnosis not present

## 2018-04-03 DIAGNOSIS — I5042 Chronic combined systolic (congestive) and diastolic (congestive) heart failure: Secondary | ICD-10-CM

## 2018-04-03 DIAGNOSIS — I679 Cerebrovascular disease, unspecified: Secondary | ICD-10-CM | POA: Diagnosis not present

## 2018-04-03 MED ORDER — METHYLPREDNISOLONE 4 MG PO TABS: 4.0000 mg | ORAL_TABLET | ORAL | 2 refills | Status: DC

## 2018-04-03 NOTE — Patient Instructions (Signed)
Today we updated your med list in our EPIC system...    Continue your current medications the same including the MEDROL 4mg  every other day...  Today we rechecked your CXR...    We will contact you w/ the results when available...   Keep up the great work with DIET (no salt, low carb) & exercise...  Call for any questions...  Let's plan a follow up visit in ~91mo, sooner if needed for problems.Marland KitchenMarland Kitchen

## 2018-04-04 ENCOUNTER — Encounter: Payer: Self-pay | Admitting: Pulmonary Disease

## 2018-04-04 NOTE — Progress Notes (Addendum)
Subjective:     Patient ID: Paul Sandoval, male   DOB: 17-Oct-1940, 77 y.o.   MRN: [id number]  HPI  ~  February 29, 2016:  [place] w/ SN>  Paul Sandoval is a 77 y/o WF w/ mult medical problems as noted below who has seen DrAlva & DrMannam previously;  I have reviewed his extensive epic records and recent [place] x2 in June2017 and formulated the following PROBLEM LIST>>  His PCP is DrLuking in [place]... He reminded me that I cared for his dad in 61...    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    ~  HSpearvillex2 in 01/2016>  Fever, cough, increased RUL opac (PCT<0.10, he received 10d Levaquin) & right>left effusion w/ thoracentesis 02/22/16 removing 1200cc clear yellow fluid, prob transudate, & BNP was >900; meds adjusted & he was diuresed w/ improvement but 2DEcho revealed worsening CHF w/ EF ~30-35% + HK & AK => he has f/u pending w/ Cards...    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    ASPVD- s/p AAA stent graft 11/2012, mod bilat carotid occlusive dis (asymptomatic) w/ CDopplers showing ~50-60% bilat ICAstenoses>  [facility] plans yearly f/u CT Abd and CDopplers    MEDICAL issues>  HBP, HL, GERD/ Divertic/ Polyps, CKD-stage3, Anemia & thrombocytopenia>  On Simva80-1/2Qd, Prilosec20/d, Fe/ MVI/ etc... Since he was disch 02/25/16>  He reports feeling better, breathing better, still sl SOB w/ activ but not requiring O2, mild cough w/ beige sput, no hemoptysis, denies CP=> he is due for f/u CXR & blood work post hosp... EXAM shows Afeb, VSS, O2sat=95% on RA after walking back; Wt=180#;  HEENT- neg, mallampati2;  Chest- sl decr BS right base, few rhonchi, w/o w/r/consolidation;  Heart- RR Gr1/6SEM w/o r/g;  Abd- soft, neg;  Ext- neg w/o c/c/e...  PFT 02/01/16>  FVC=1.96 (45%), FEV1=1.25 (40%), %1sec=64, mid-flows were reduced at 28% predicted; post bronchodil there was a 31% improvement in FEV1 to 1.64;  TLC=5.96 (83%, RV=3.89 (  149%), RV/TLC=65%;  DLCO=58% pred.  This is c/w moderate to severe airflow obstruction, GOLD Stage 3 COPD w/ a signif asthmatic component, air trapping, and decr diffusion capacity...  2DEcho 02/22/16>  Mild conc LVH w/ decr LVF & EF=30-35% w/ diffuse HK & AK in several walls (see report), Gr2 DD, mild MR otherw norm valves, mild LAdil (82m), norm RV function & PA pressures  LABS 01/2016 Hosp>  Chems- ok x Cr=1.3-1.4, BS=120-140, Alb=2.8;  CBC- anemic w/ Hg=8-9 range, Plat~130K range;  TSH=0.92  CT Chest 02/21/16>  Norm heart size, extensive coronary calcif, s/p CABG, decr size of the pathological  right paratracheal LN, decr size of RUL mass (now ~2cm & prev ~4cm), new confluent airsp opac w/ air bronchograms in RUL w/ large right effusion; s/p GB, +HH, Abd Ao stent graft, DJD & DISH w/o metastatic lesions  CT Angio Chest 02/24/16>  NEG for PE, s/pCABG & stent in Lmain, norm heart size, no pericard fluid, small right pleural effusion, architectural distortion & interstitial opacities in RUL c/w XRT changes, RUL nodule measures ~2cm, no adenopathy reported...   CXR 02/29/16>  Stable heart size, tortuous Ao, s/p CABG, sl improvement in interstitial opac in RUL area & posteromedial RUL nodule  LABS 02/29/16>  Chems- ok x HCO3=36, BS=120, BUN/Cr=30/1.40;  CBC- Hg=10.9, Plat=249K, WBC=18.2 (on Pred);  BNP=1053;  Sed=53 IMP/PLAN>>  Problem list as above w/ signif cardiac, pulmonary, & post chemoradiation changes;  From the pulm standpoint- he feels the BREO is helping & hasn't needed the rescue inhaler very often, continue same;  He is on a long PRED taper per DrManning for poss radiation fibrosis RUL, Sed rate is 53, continue this taper & careful w/ sweets/ sugar etc;  From the cardiac standpoint- he has signif underlying dis w/ Lmain stent, s/p CABG 20 yrs ago, prob ischemic cardiomyopathy w/ worsening 2DEcho recently w/ 30-35% EF + HK&AK seen, BNP=1053, Cards plans ischemic work up when able, in the meanwhile he has improved w/ diuresis but Chems w/ mild RI & HCO3=36... REC- add DIAMOX-ER 500 one tab daily at 4pm, continue his Lasix40 Qam, increase water intake, watch weights... We plan ROV 162monthecheck...  ~  April 04, 2016:  89m83moV w/ SN>> BilCandicelled the Oncology symptom management clinic 8/4 w/ intermittent cough, wheezing, fever; they did a f/u CXR interpreted as showing a right base opac suspicious for acute pneumonia but this is a soft finding on my review of the XRay, and the prev RUL reticulonod opac is improved (radiation fibrosis improved w/ Pred taper); placed on Levaquin Rx;  He denies sput  production, hemoptysis, CP, or SOB but he is fairly sedentary.    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    ~  HCorleyx2 in 01/2016>  Fever, cough, increased RUL opac (PCT<0.10, he received 10d Levaquin) & right>left effusion w/ thoracentesis 02/22/16 removing 1200cc clear yellow fluid, prob transudate, & BNP was >900; meds adjusted & he was diuresed w/ improvement but 2DEcho revealed worsening CHF w/ EF ~30-35% + HK & AK => he has f/u pending w/ Cards...    ~  [facility]  He had right pleural effusion tapped 02/22/16=> Prob TRANSUDATE w/ TProt<3.0, LDH=95, Cytology=NEG (reactive mesothelial cells only);  Cults=> no growth    ~  03/2016 developed cough/ wheezing/ low grade fever=> CXR w/ improved RUL, ?incr markings R base, he was off the Pred, given more Levaquin...     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    ~  He saw CARDS PA 03/01/16> no change in meds, Lexiscan Myoview 03/06/16 showed hi risk study w/ EF=30%, no ST segm changes, c/w antero-apical MI & no evid reversible ischemia...    ASPVD- s/p AAA stent graft 11/2012, mod bilat carotid occlusive dis (asymptomatic) w/ CDopplers showing ~50-60% bilat ICAstenoses>  [facility]- plans yearly f/u CT Abd and CDopplers    MEDICAL issues>  HBP, HL, GERD/ Divertic/ Polyps, CKD-stage3, Anemia & thrombocytopenia>  On Simva80-1/2Qd, Prilosec20/d, Fe/ MVI/ etc... EXAM shows Afeb, VSS, O2sat=99% on RA; Wt=178#;  HEENT- neg, mallampati2;  Chest- sl decr BS right base, few rhonchi, w/o w/r/consolidation;  Heart- RR Gr1/6SEM w/o r/g;  Abd- soft, neg;  Ext- neg w/o c/c/e...  Lexiscan Myoview 03/06/16 showed hi risk study w/ EF=30%, no ST segm changes, c/w antero-apical MI & no evid reversible ischemia...  CXR 03/30/16>  incr patchy opac at the right base, no effusion, RUL reticulonodular opac in RUL has regressed; norm heart size s/p CABG, abd Ao endograft part vis  LABS 03/28/16>  Chems- ok w/ K=4.0, HCO3=23, Cr=1.6, BS=145;  CBC- ok w/ Hg=11.4, wbc=6.1, plat=85K;   IMP/PLAN>>  Discussed w/ Paul Sandoval-- continue the Levaquin til gone, use Tylenol for fever or pain; continue the Breo daily & Ventolin-HFA as needed; wean off the Diamox at this time, continue Lasix40, no salt, etc; he needs to gradually increase his exercise/ mobility; we will plan ROV w/ labs in 274mo.  ~  June 06, 2016:  20m37moV w/ SN>  BilHenriqueports that he is feeling better, gaining strength, walking 20mi10m mowing yard, and resting well at night;  He notes min hacking cough, no sput- no discoloration or blood, SOB is diminished & he denies CP, no f/c/s... He saw DrMohamed 05/16/16- pt remains on observation (s/p chemoradiation, 5 cycles w/ partial  response); repeat CT Chest 05/10/16 showed more confluent airsp dis in RUL w/ vol loss, abn soft tissue attenuation in right hilum has progressed (?felt to be treatment related), stable subcarinal LN ~11mm57mderate right effusion... DrMohamed felt this scan showed no concerning features of dis progression & they opted for continued observ  w/ f/u 50mo.. 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    ~  [place]2 in 01/2016>  Fever, cough, increased RUL opac (PCT<0.10, he received 10d Levaquin) & right>left effusion w/ thoracentesis 02/22/16 removing 1200cc clear yellow fluid, prob transudate, & BNP was >900; meds adjusted & he was diuresed w/ improvement but 2DEcho revealed worsening CHF w/ EF ~30-35% + HK & AK => he has f/u pending w/ Cards...    ~  [facility]  He had right pleural effusion tapped 02/22/16=> Prob TRANSUDATE w/ TProt<3.0, LDH=95, Cytology=NEG (reactive mesothelial cells only);  Cults=> no growth    ~  03/2016 developed cough/ wheezing/ low grade fever=> CXR w/ improved RUL, ?incr markings R base,  he was off the Pred, given more Levaquin...     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    ~  He saw CARDS PA 03/01/16> no change in meds, Lexiscan Myoview 03/06/16 showed hi risk study w/ EF=30%, no ST segm changes, c/w antero-apical MI & no evid reversible ischemia...    ASPVD- s/p AAA stent graft 11/2012, mod bilat carotid occlusive dis (asymptomatic) w/ CDopplers showing ~50-60% bilat ICAstenoses>  [facility]- plans yearly f/u CT Abd and CDopplers    MEDICAL issues>  HBP, HL, GERD/ Divertic/ Polyps, CKD-stage3, Anemia & thrombocytopenia>  On Simva80-1/2Qd, Prilosec20/d, Fe/ MVI etc... EXAM shows Afeb, VSS, O2sat=95% on RA; Wt=183# (up 5#);  HEENT- neg, mallampati2;  Chest- sl decr BS right base, few rhonchi, w/o w/r/consolidation;  Heart- RR Gr1/6SEM w/o r/g;  Abd- soft, neg;  Ext- neg w/o c/c/e...  CT Chest 05/10/16 showed more confluent airsp dis in RUL w/ vol loss, abn soft tissue attenuation in right hilum has progressed (?felt to be treatment related), stable subcarinal LN ~1161mmoderate right effusion.  CXR 06/06/16> progressive incr density in RUL & vol loss, soft tissue fullness in right hilum, s/p CABG and calcif in wall of Ao arch  PET scan 06/15/16> persistent but diminished hypermetabolism in the RUL area of interstitial & airsp opac-  likely related to XRT, no hypermetabolism in the prev R paratrachial LN, similar sized subcarinal LN w/ low level hypermetabolism noted; new area of LUL anterior  interstitial & airsp dis w/ some bronchiectasis shows low level hypermetabolism (?radiation change?); moderate right effusion...  IMP/PLAN>>  He is feeling better, performance status improving, CXR shows progressive changes in RUL s/p chemoradiation; CT=>PET scans w/ vol loss & persistent hypermetabolism/ evolving changes in RUL & right hilum plus he has a mod large right effusion; prev thoracentesis 01/2016 in hosp was transudative- I believe he would benefit from further eval/ repeat thoracentesis to recheck this fluid; we will set this up via IR & ask them to drain the fluid as much as poss, send it for Cytology, cell ct & diff, TProt/ LDH/ Gluc...  ~  August 06, 2016:  85moROV w/ SN>  BAldinreports that his SOB & energy have improved, appetite is better; he is more active walking 271m5d/wk + yard work/ mowing/ leaves/ etc; he notes min cough, small amt beige sput, stable DOE, no CP or edema... He tells me that DrMohamed has CT Chest & LABS ordered for next week so we will wait for these tests & review when avail...     COPD (Stage3 w/ signif revers component), former smoker (quit 1996 w/ ~40+ pack-yr hx)>  On Symbicort160-2spBid regularly ([facility] changed Rx), VentolinHFA rescue inhaler as needed...    He saw DrCherly Hensenor CARDS 07/04/16>  HBP, CAD- s/p CABG 1996 & subseq PCI to graft vessels, chr combined sys&diast CHF w/ cardiomyopathy, PVD- w/ carotid dis & Ao stent graft per drLawson, HL, CKD;  No changes made to his ASA/ Plavix, Metoprolol50-1.5Bid, Lasix40, Simva40...     EXAM shows Afeb, VSS, O2sat=98% on RA; Wt=187# (up 4#);  HEENT- neg, mallampati2;  Chest- sl decr BS right base, few rhonchi, w/o w/r/consolidation;  Heart- RR Gr1/6SEM w/o r/g;  Abd- soft, neg;  Ext- neg w/o c/c/e...  Throacentesis 06/22/16 by IR>  1.2L removed (hazy yellow fluid)=> Cell ct=923 cells w/ 2 Neutro/ 58 lymph/ 40 mono-macrophages;  Chems- LDH=119, TPro=4.1, Gluc=101;  Cyto= Atypic cells c/w reactive mesothel cells.  Post  thoracentesis CXR 06/22/16>  decr right effusion, no pneumoth, dense consolid RUL unchanged, post-op BABG...  CT Chest 08/14/16>  Norm heart size- s/p CABG & Ao atherosclerotic changes; stable upper lim adenopathy; decr right effusion- now small in size; similar interstitial & airsp dis in right perihilar & medial LUL is c/w radiation fibrosis; no signif pulm nodules or masses- no new or progressive dis...   LABS 08/14/16>  Chems- ok x Cr=1.6;  CBC- ok w/ Hg=12.7...Marland KitchenMarland KitchenMP/PLAN>>  BiIzaihas clinically stable, no clear evid of recurrent dis & followed very closely by DrMohamed; Pulm stable on Symbicort Bid, Albut rescue prn, and his exercise- continue same & we plan rov recheck in 28m40mo  ~  November 05, 2016:  28mo38mo & Paul Sandoval indicates "stronger, better" on diet & exercise program w/ good energy (eg- climbed steps at a ball game);  He denies much cough (clear throat), small amt clear sput, no blood, SOB w/ exertion only- ADLs ok & DOE stable, no CP & he describes 2mi 25mk 5d/wk & he stays busy...  [facility] (sl incr subcarinal LN, scarring & radiation fibrosis, ?sl incr right effusion)... 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    ~  [place] in 01/2016>  Fever, cough, increased RUL opac (PCT<0.10, he received 10d Levaquin) & right>left effusion w/ thoracentesis 02/22/16 removing 1200cc clear yellow fluid, prob transudate, & BNP was >900; meds adjusted & he was diuresed w/ improvement but 2DEcho revealed worsening CHF w/ EF ~30-35% + HK & AK => he has f/u pending w/ Cards...    ~  [facility]  He had right pleural effusion tapped 02/22/16=> Prob TRANSUDATE w/ TProt<3.0, LDH=95, Cytology=NEG (reactive mesothelial cells only);  Cults=> no growth    ~  03/2016 developed cough/ wheezing/ low grade fever=> CXR w/ improved RUL, ?incr markings R base, he was off the Pred, given more Levaquin...     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    ~  He saw CARDS PA 03/01/16> no change in meds, Lexiscan Myoview 03/06/16 showed hi risk study w/ EF=30%, no ST segm changes, c/w antero-apical MI & no evid reversible ischemia...    ASPVD- s/p AAA stent graft 11/2012, mod bilat carotid occlusive dis (asymptomatic) w/ CDopplers showing ~50-60% bilat ICAstenoses>  [facility]- plans yearly f/u CT Abd and CDopplers    MEDICAL issues>  HBP, HL, GERD/ Divertic/ Polyps, CKD-stage3 (cr=1.6), Anemia (Hg=11.9) & thrombocytopenia (plat=171K)>  On Simva80-1/2Qd, Prilosec20/d, Fe/ MVI etc... EXAM shows Afeb, VSS, O2sat=98% on RA; Wt=189# (up 2#);  HEENT- neg,  mallampati2;  Chest- sl decr BS right base, few rhonchi, w/o w/r/consolidation;  Heart- RR Gr1/6SEM w/o r/g;  Abd- soft, neg;  Ext- neg w/o c/c/e...  CT Chest 10/30/16>  Norm heart size, s/p CABG, atherosclerosis in Ao; prev 126msubcarinal LN now measures 1370msl incr in right pleural effusion, radiation fibrosis & changes in medial right lung and ant LULare stable/ unchanged;   LABS 10/29/16>  Chems- wnl w/ Cr=1.6, LFTs= wnl;  CBC- ok w/ Hg=11.9, WBC=10.1  Ambulatory Oximetry 11/05/16>  O2sat=96% on RA at rest w/ pulse=81/min;  He ambulated 3 laps in office (185'each) w/ lowest O2sat=90% w/ pulse 99/min... IMP/PLAN>>  BilDawit doing satis on Symbicort160-2spBid & Albut rescue inhaler prn; he states that he is active, energy is good, & that he's getting stronger &  better (good performance status); I suspect that he has residual cancer in the right chest but it appears slowly progressive (w/ sl incr right effusion & subcarinal LN) and DrMohamed to review status & decide regarding additional therapy at this point vs continued observation... we plan rov recheck in 3-641mo  NOTE:  >50% of this 25 min rov was spent in counseling & coordination of care...  ~  Jan 02, 2017:  269moOV & Paul Sandoval called 4/24 c/o cough, yellow sput, low grade fever after having been Rx x2 by PCP w/ Doxy (note- Flu-B was pos in resp viral panel); we placed him on Levaquin & Medrol dosepak & he is some better- still congested and phlegm is beige but feeling better overall; we discussed addition of Mucinex 1200bid + fluids vs Guaifenesin 400-2Tid to save $$...     COPD> on Symbicort160-2spBid and Ventolin-HFA prn    Lung cancer- followed by DrMohamed & DrManning> RUL non-small cell ca IIIA, dx 3/17 & rx w/ Chemoradiation; subseq RUL opac (infection vs XRT-fibrosis & right effusion;  He saw DrMohamed 11/15/16- note reviewed & he was doing well at that time, playing golf & feeling good; CT Chest 10/30/16 w/ sl incr right effusion, sl incr  subcarinal LN from 10=>1362mradiation fibrosis areas stable; DrMohamed felt there was no clear sign of dis progression & rec f/u scans in 37mo837moe 01/2017)...    Cardiac- CAD, s/p CABG, Cardiomyopathy, CHF> on ASA/ Plavix, Metop25Bid, Lasix40; he saw DrNiCherly Hensen7/18- note reviewed; EF=30%, he mentioned more aggressive cardiac w/u & rx if cancer progrosis favorable (no changes made, f/u 34mo 9moDEcho)...    ASPVD- s/p AAA stent graft, bilat carotid dis> on ASA/ Plavix    Medical issues>  HBP, HL, GERD/ Divertic/ Polyps, CKD-stage3 (cr=1.6), Anemia (Hg=11.9) & thrombocytopenia (plat=171K)> EXAM shows Afeb, VSS, O2sat=98% on RA; Wt=183# (down 6#);  HEENT- neg, mallampati2;  Chest- sl decr BS right base, few rhonchi, w/o w/r/consolidation;  Heart- RR Gr1/6SEM w/o r/g;  Abd- soft, neg;  Ext- neg w/o c/c/e... IMP/PLAN>>  Paul Sandoval but it's been a slow process; he is at high risk for cancer progression & followed closely by DrMohamed who plans f/u CT & recheck in June;  Continue current meds, add Guaifenesin 2400mg/73mwater to aid in mucus production... We will recheck in 6mo & 41moew his f/u scans planned by Oncology.  ~  February 04, 2017:  41mo ROV69modd-on appt requested for cough, thick yellow sput, chest congestion w/ wheezing>  Notes low grade temp in the afternoon w/o c/s;  Denies CP/ palpit/ hemoptysis/ change in SOB/DOE; He is taking SYMBICORT160-2spBid, GFN 400-2Tid + fluids, plus rescue inhaler => we gave him a NEB Rx in office (min relief), sent sput for C&S (grew abundant NTF only); we discussed need for f/u CXR (CT due soon from Mohamed)Yolox w/ Levaquin/ Pred taper...    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    ~  HKalonax2 in 01/2016>  Fever, cough, increased RUL opac (PCT<0.10, he received 10d Levaquin) & right>left effusion w/ thoracentesis 02/22/16 removing 1200cc clear yellow fluid, prob transudate, & BNP was >900; meds adjusted & he was diuresed w/ improvement but 2DEcho revealed worsening CHF w/ EF ~30-35% + HK & AK => he has f/u pending w/ Cards...    ~  [facility]  He had right pleural effusion tapped 02/22/16=> Prob TRANSUDATE w/ TProt<3.0, LDH=95, Cytology=NEG (reactive mesothelial cells only);  Cults=> no growth    ~  03/2016 developed cough/ wheezing/ low grade fever=> CXR w/ improved RUL, ?incr markings R base, he was off the Pred, given more Levaquin...     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 pt was [facility]uresed & they plan an outpt ischemic eval due to decr EF & wall motion abn..    ~  He saw CARDS PA 03/01/16> no change in meds, Lexiscan Myoview 03/06/16 showed hi risk study w/ EF=30%, no ST segm changes, c/w antero-apical MI & no evid reversible ischemia...    ASPVD- s/p AAA stent  graft 11/2012, mod bilat carotid occlusive dis (asymptomatic) w/ CDopplers showing ~50-60% bilat ICAstenoses>  [facility]- plans yearly f/u CT Abd and CDopplers    MEDICAL issues>  HBP, HL, GERD/ Divertic/ Polyps, CKD-stage3 (cr=1.6), Anemia (Hg=11.9) & thrombocytopenia (plat=171K)>  On Simva80-1/2Qd, Prilosec20/d, Fe/ MVI etc... EXAM shows Afeb, VSS, O2sat=97% on RA; Wt=179# (down 4#);  HEENT- neg, mallampati2;  Chest- sl decr BS right base, few rhonchi, w/o w/r/consolidation;  Heart- RR Gr1/6SEM w/o r/g;  Abd- soft, neg;  Ext- neg w/o c/c/e...  CXR 02/04/17 (independently reviewed by me in the PACS system)> ?loculated mod right pleural effusion, RUL airsp dis is unchanged & c/w post XRT changes; left lung is clear, stable cardiac silhouette & prior CABG  Sputum C&S> abundant NTF, no pathogen identified... IMP/PLAN>>  We discussed the need for another thoracentesis=> check cytology;  We will Rx w/ empiric LEVAQUIN & PREDNISONE taper, plus his GFN at max doses w/ fluids;  Also awaiting f/u CT Chest due soon...  ADDENDUM>> Thoracentesis by IR 02/11/17>  280cc of dark bloody fluid removed, due to loculation no additional fluid was obtainable;  Fluid= exudative. & Cytology= NEGATIVE... ADDENDUM>> f/u CT Chest done 02/13/17>  Norm heart size, aortic atherosclerosis, prev CABG; mod loculated right pleural effusion; mass-like architectural distortion in RUL c/w XRT, new area of airsp consolidation in RML~3cm, mult faint nodular densities scattered on the right lung, borderline subcarinal node; Abd & musculoskeletal are ok... ADDENDUM>>  02/15/17>  Pt indicates that he is ?no better, c/o persistent cough, congested, thick yellow sput, no hemoptysis, low grade temps 99-100 range, denies much CP (just sore from coughing) and SOB/DOE about the same;  We discussed his limited options here & have suggested the following--    1) keep PRED 12m/d for now;  Add empiric AUGMENTIN 872mone tab bid x10d  (plus Align/Activia);  Add NEBULIZER w/ ALBUTEROL TID regularly and space out the Symbicort Rx still at 2sp Bid...Marland KitchenMarland Kitchen  2) check 2DEcho to compare EF to 7/17 (30-35%)    3) refer to TCTS for the chr right pleural effusion to consider poss chest tube vs VATS vs decortication surg (DrBartle did his CABG yrs ago)  ~  July 18.2018:  25m58moV & post hosp visit>  BilKerns undergone an extensive work up over the last month or so w/ thoracentesis, CT Chest, TSurg consultation followed by VATS drainage & pleurosesis, and f/u 2DEcho showing worsening LVF=> referred to CHF clinic & there recommendations are pending...    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) presenting w/ RUL mass w/ mediastinal adenopathy 3/17;  He received chemoradiation w/ wkly carboplatin & paclitaxel (5 cycles w/ part response) + XRT from DrManning finished 01/26/16;  Follow up CT Chest 02/13/17 reviewed (see above), & DrMohamed felt there was no recurrence or dis progression but +for inflamm changes in RLL & loc pleural effusion; rec to re-scan in 49mo34mo We set him up to see DrBartle 02/25/17> he reiewed prev eval, scan, thoracenteses, etc; he agreed w/ performing a right VATS to drain the resid effusion 7 examine the pleural space for tumor/ infection etc followed by pleurodesis...    He was ADM 7/6 - 03/08/17 by DrBartle w/ right VATS w/ drainage of pleural effusion & Bx of pleural surface + talc pleurodesis=> all pathology was benign (fibrous tissue & chr inflamm; Disch on similar med regimen + [phone] stable since surg- +SOB/DOE w/ stairs etc, reports eating well, no CP & hasn't required pain meds; denies f/c/s, notes min cough & sm amt whitish sput- no color, no blood... Finishing Levaquin, off Pred, on NEB w/ Albut Tid, Symbicort160-2spBid, GFN400-2Tid + fluids...    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 EXAM shows Afeb, VSS, O2sat=99% on RA; Wt=174# (down 5#);  HEENT- neg, mallampati2;  Chest- sl decr BS right base, few rhonchi, w/o w/r/consolidation;  Heart- RR Gr1/6SEM w/o r/g;  Abd- soft, neg;  Ext- neg w/o c/c/e...  2DEcho done 02/26/17 showed mod dil LV & severely reduced LVF w/ EF down to 20-25%, Gr1DD, mod calcif AoV leaflets, MV was OK, RV mod dilated & sys function mildly reduced, no PAsys reported...Marland KitchenMarland Kitchen  IMP/PLAN>>  The worsening LVF w/ EF 38-10% is certainly playing a roll in his unrespolved dyspnea=> refer to CHF clinic for active management; in the interim continue NEBs, Symbicort, Mucinex, etc...   ~  April 24, 2017:  6wk [place] had CHF clinic eval by [facility] on 04/22/17-- note reviewed, Hx CAD w/ CABG in 1996 & PCI in 2015, ischemic cardiomyopathy, and c/o incr SOB & recent 2DEcho (02/2017) showed EF=20-25%;  DrMcLean adjusted his meds by changing Metoprolol to Coreg & adding Entresto, ultimately he would like to add Spironolactone; they discussed R+L heart cath soon (sched 9/14)...      Jahzier has had f/u appts w/ PCP-DrLuking on 03/14/17 (transitional care follow up) and TCTS-DrBartle on 03/20/17>  Notes reviewed, his breathing is somewhat improved, on Levaquin for Serratia, DrBartle noted>>  Most of the right lung is firmly adherent to the chest wall from his prior XRT. There is a loculated fluid collection at the apex of the chest that is sealed in by the lung and could not be drained. There was a large collection in the  lower pleural space that was drained and talc pleurodesis performed. Hopefully this space will resolve over time with the talc and not refill with fluid. There is no evidence of malignancy so it is possible that this is related to heart  failure given his EF of 20-25%. He reports that he has started back to walking & slowly building up, even played 2 nine-hole rounds of golf when it wasn't too humid; he has a resid cough but decr sput & easier to produce, he requests a handicap placard... See prob list above... EXAM shows Afeb, VSS, O2sat=96% on RA; Wt=178# (up 4#);  HEENT- neg, mallampati2;  Chest- sl decr BS right base, few rhonchi, w/o w/r/consolidation;  Heart- RR Gr1/6SEM w/o r/g;  Abd- soft, neg;  Ext- neg w/o c/c/e...  CXR 04/24/18 (independently reviewed by me in the PACS system) shows left lung clear, right sided vol loss, small A/F levels at the base, pleural fluid vs thickening at right base, stable soft tissue fullness at right apex, s/p CABG etc. IMP/PLAN>>  Neven appears stable post T-surg & improved w/ CHF-clinic Rx per DrMcLean;  OK 2018 FLU shot today, and continue pulm rx w/ Albut NEBS Tid, Symbicort160-2spBid, GFN400-2Bid;  Call for any questions and we plan f/u visit in 83mo..  ~  June 24, 2017:  288moOV & Kenaz presents w/ a 2d hx incr cough, yellow sput, & chest congestion- denies f/c/s;  He's been using NEBs Qid (incr per DrLuking), plus his Symbicort160-2spBid & GFN400-2Tid;  He had a ZPak called in recently;  We discussed BacterimDS Bid x10d for  this COPD exac + Pred20- 5d slow tapering protocol (see AVS) w/ rov recheck 6wks...     He saw TCTS-DrBartle 05/22/17>  S/p bronch & right VATS to drain the loculated right effusion & talc pleurodesis 03/01/17;  At surg he noted most of the right lung was firmly adherent to the chest wall from his prior XRT. There was a loculated fluid collection at the apex of the chest that was sealed in by the lung and could not be drained. There was a large collection in the lower pleural space that was drained and talc pleurodesis performed. Unfortunately the right lower lobe was partially collapsed and chronically scarred and would not expand to fill in the space formed after draining the effusion => they signed off...    He saw [facility]-DrCain 05/24/17>  Hx AAA- s/p endovasc stent graft repair, & hx Carotid art dis being followed;  Abd Ao Duplex was stable w/ 3.3 x 3.4cm aneurysm w/o elev velocities;  CDuplex w/ 40-59% RICA stenosis & 3-29% LICA and they plan recheck 55yr..    He saw CARDS-DrMcLean 05/27/17>  CAD, s/p CABG 1996, subseq PCIs, RBBB, ischemic cardiomyopathy/ CHF w/ 2DEcho showing EF=20-25% in 7/18;  Also has Hx AAA w/ prec stent graft repair;  He had CATH 04/2017 showing occluded SVG-small D, patent LIMA-LAD and SVG-PDA, 50% shelf-like stenosis distal left main (no intervention), filling pressure not elevated and cardiac output preserved.  On Coreg & Entresto, they added spironolactone & f/u 249mo   He saw ONC-DrMohamed 05/28/17>  DIAGNOSIS: Stage IIIA (T2b, N2, M0) non-small cell lung cancer, favoring squamous cell carcinoma presented with right upper lobe lung mass in addition to mediastinal lymphadenopathy diagnosed in March 2017.  PRIOR THERAPY:  A course of concurrent chemoradiation with weekly carboplatin for AUC of 2 and paclitaxel 45 MG/M2. Status post 5 cycle with partial response.  CURRENT THERAPY: Observation.  He had f/u CXR & CT Chest (see below)- no evid of dis progression/ recurrence & f/u planned  53m39mo    He saw PCP-DrLuking 06/06/17>  Annual wellness visit, note reviewed, same meds & they discussed compliance issues... EXAM shows Afeb, VSS, O2sat=98% on RA; Wt=178# (stable);  HEENT- neg, mallampati2;  Chest- sl decr BS right base, few rhonchi, w/o w/r/consolidation;  Heart- RR Gr1/6SEM w/o r/g;  Abd- soft, neg;  Ext- neg w/o c/c/e...  CT Chest 05/21/17>  See report -- IMPRESSION:  1) Similar radiation fibrosis within the paramediastinal right lung.  2) Increase in ill-defined micro nodularity bilaterally. Favor infection, including atypical etiologies.  3) Interval T4 compression deformity, favored to be posttraumatic. No metastasis identified in this region on the prior CT.  4) Aortic Atherosclerosis.  5) Loculated right-sided pleural fluid and inferior right hydropneumothorax, similar.  6) Tiny hiatal hernia. Gastric underdistention with possible gastric wall thickening suggestive of gastritis.  7) Similar borderline subcarinal adenopathy.  8)  Emphysema.  CXR 05/22/17>  Norm heart size w/ prev CABG & ao calcif, stable vol loss on the right & soft tissue fullness in right hilum, w/ consolid right apex, mod right effusion w/ loculated air/fluid IMP/PLAN>>  As above- we discussed treating THIS COPD exac w/ BacterimDS & PRED taper; he is to continue w/ his NEBS, Symbicort, GFN and we plan recheck in 6wks time...   ~  August 08, 2017:  6wk ROV & it is unclear if BilAkhiler really cleared up after the last OV- he'd been treated w/ ZPak, then BacterimDS w/ Pred taper but returns today stating "not good, I need a little help, getting weaker"; he brought in a sputum sample w/ thick beige sput=> cult grew moraxella cataralis & we discussed treatment this go-round w/ AUGMENTIN + Depo80 & a prolonged slow tper of Medrol8mg38mbs...     He had one interim f/u visit 08/02/17 w/ CARDS-DrMcLean> CAD, s/p CABG 1996, subseq PCIs, RBBB, ischemic cardiomyopathy/ CHF w/ 2DEcho showing EF=20-25% in 7/18;  Note  reviewed- they titrated up his Coreg to 9.375Bid, Entresto to 24/26Bid, & Spironolactone to 25mg64m. We reviewed the following medical problems during today's office visit>     COPD (Stage3 w/ signif revers component), former smoker (quit 1996 w/ ~40+ pack-yr hx)>  On NEBs w/ Albut Qid, Symbicort160-2spBid regularly, VentolinHFA rescue inhaler & GFN as needed; with persistent symptoms we discussed changing Albut  to DUONEB Qid, Symbicort160-2spBid, incr GFN400 to 2tabsTid + fluids & to work hard expectorating the mucus...    Freq COPD exacerbations & sput C&S growing Serratia 7/18 (resist to cephalosporins), and MCat 12/18 (wasn't better after ZPak & BacterimDS, therefore switched to Augmentin rx)... Now w/ MCat in sput & no better after ZPak & BacterimDS + Pred- we are treating w/ Augmentin x10d & long slow Medrol taper...     Non-small cell lung cancer, Stage IIIA, diagnosed 10/2015 & treated by DrMohamed & DrManning w/ Chemoradiation (carboplatin & paclitaxel)     ~  Bronch 11/17/15 by Kallie Edward showed no endobronchial lesions- TBBx were neg, but brushing & washings were pos for malig cells (felt to be c/w SqCellCa)    ~  He received ChemoRx from DrMohamed with weekly carboplatin and paclitaxel status post 4 cycles finished 01/24/16 (dose held on several occas due to side effects)    ~  He received XRT 4/17 - 01/26/16 by DrManning to the primary tumor & involved mediastinal adenopathy w/ 66 Gy (33 fractions of 2Gy each); he had mod esophagitis & some fatigue, plus post radiation scarring in the lung...    ~  DrMohamed follows w/ serial CT Chest scans every 74mo=> now every 614mo/ f/u due 11/2017...    Abnormal CXR w/ RUL mass, s/p treatment as above, & subseq RUL opac (?infection vs radiation fibrosis changes), right pleural effusion>      ~  Treated w/ antibiotic Rx & PREDNISONE taper (30-25-20-15-10-5 Q5d over 4m66moer DrManning...    ~  [facility]os[facility] in 01/2016>  Fever, cough, increased RUL opac (PCT<0.10, he  received 10d Levaquin) & right>left effusion w/ thoracentesis 02/22/16 removing 1200cc clear yellow fluid, prob transudate, & BNP was >900; meds adjusted & he was diuresed w/ improvement but 2DEcho revealed worsening CHF w/ EF ~30-35% + HK & AK => Cards f/u.    ~  [facility]  He had right pleural effusion tapped 02/22/16=> Prob TRANSUDATE w/ TProt<3.0, LDH=95, Cytology=NEG (reactive mesothelial cells only);  Cults=> no growth    ~  Progressive changes in right chest and recurrent symptoms over 2017-2018 w/ interval thoracentesis etc-- all led up to right VATS surg & talc pleurodesis 02/2017 by DrBartle (all path was benign)...    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    ~  He saw CARDS PA 03/01/16> no change in meds, Lexiscan Myoview 03/06/16 showed hi risk study w/ EF=30%, no ST segm changes, c/w antero-apical MI & no evid reversible ischemia...    ~  Referred to DrMcLean's CHF clinic 7/18 w/ change in meds and titration of Coreg, Entresto, Spironolactone => on-going f/u & titration of meds...    ASPVD- s/p AAA stent graft 11/2012, mod bilat carotid occlusive dis (asymptomatic) w/ CDopplers showing ~50-60% bilat ICAstenoses>  [facility]- plans yearly f/u CT Abd and CDopplers    MEDICAL issues>  HBP, HL, GERD/ Divertic/ Polyps, CKD-stage3 (cr=1.6), Anemia (Hg=11.9) & thrombocytopenia (plat=171K)>  On Simva80-1/2Qd, Prilosec20/d, Fe/ MVI etc... EXAM shows Afeb, VSS, O2sat=95% on RA; Wt=183# (stable);  HEENT- neg, mallampati2;  Chest- sl decr BS right base, few rhonchi, w/o  w/r/consolidation;  Heart- RR Gr1/6SEM w/o r/g;  Abd- soft, neg;  Ext- neg w/o c/c/e...  LABS 08/02/17>  Chems- ok w/ Cr=1.43,  K=4.0 IMP/PLAN>>  As noted above Paul Sandoval is having a rough time lately>  He had the right VATS surg & talc pleurodesis 02/2017;  He has a severe ischemic cardiomyopathy w/ EF= 20-25% & now followed closely by DrMcLean w/ freq (Q58mo visits in the CHF clinic for medication titration;  He has severe obstructive lung dis w/ FEV1=1.25 (40%) in 01/2016 and freq exacerbations as documented above;  We reviewed recommendations for NEBS w/ DUONEB Qid, Symbicort160-2spBid, GFN400-2tabs Tid w/ fluids;  We are starting a 10d course of AUGMENTIN875Bid and a long slow MEDROL taper w/ planned recheck in 6wks;  He will continue to work on chest physiotherapy & bronchial mucus hygiene...  ~  September 19, 2017:  6wk ROV & pulmonary follow up> BRush Landmarkis stable on his medication regimen as outlined above; he reports another URI w/ yellow/ green mucus & treated by his PCP (DrLuking) w/ [employer] he is still on the Medrol 875m 1/2 tab Qod; overall he feels that his SOB/ breathing is much improved;  He remains on DUONEB Qid, Symbicort160-2spBid, GFN400-2Tid w/ fluids, & currently on Medrol4m36md=> rec to stay on this...     COPD- GOLD Stage3 w/ revers component, & freq infectious exac> rec to be diligent w/ NEB treatments, Symbicort, GFN, etc...     Hx non-small cell lung cancer, stage IIIA- dx 10/2015 & treated by DrMohamed & DrManning w/ chemoradiation; he is due for 27mo31mo w/ oncology 11/2017...    Abn CXR- s/p treatment as noted, subseq RUL opac (?infection vs radiation fibrosis?) & right effusion...    CAD, s/pCABG1996, RBBB, ischemic cardiomyopathy w/ acute on chr sys & diast CHF> followed by DrMcLean's CHF clinic...    ASPVD- s/p AAA stent graft 2014, mod bilat carotid dis...    Medical issues> HBP, HL, GERD/ divertics/ polyps, CKD-stage3 (Cr~1.6), anemia, etc...  EXAM shows Afeb, VSS, O2sat=95% on RA; Wt=180# (stable);  HEENT- neg, mallampati2;  Chest- sl decr BS right base, few rhonchi, w/o w/r/consolidation;  Heart- RR Gr1/6SEM  w/o r/g;  Abd- soft, neg;  Ext- neg w/o c/c/e...  LABS 09/19/17>  FLP- all parameters at goals on diet alone;  Chems are ok on current meds & Cr stable at 1.54;  CBC- sl improved w/ Hg=12.3, WBC-9.4;  TSH=2.66;  BNP=413;  Sed=51... REC to continue Lasix40, & no salt diet + continue MEDROL 4mg 81m...  IMP/PLAN>>  Paul required mult antibiotics to get over his recent upper resp infection=> ZPak, then Bacterim, then Augmentin, then [facility]14[facility] is asked to stay on this; he requested follow up labs (as above); reminded to stay as active as poss w/ a regular exercise program...   ~  December 19, 2017:  87mo RO51moBilly rHymens feeling well- denies problems, walking 45min d3m & doing yard work!  He is much improved overall;  About 6wks ago he called w/ incr cough & mucus production, we called in Levaquin & he improved- now w/ min cough, sm amt whitish sput, decr SOB, stable DOE, no CP, & resting better... He remains on MEDROL 4mgQod, 67mS w/ Duoneb TID regularly, SYMBICORT160-2spBid, GFN400-2Tid w/ fluids, plus his cardiac meds... We reviewed the following interval medical notes since his last visit Jan2019>      He saw CARDS- DrMcLean on  10/08/17>  CAD, s/p CABG in 1996, w/ DES to  [place] stenosis & 2 PCIs in 2015, ischemic cardiomyopathy/ CHF w/ EF down to 20-25% in 02/2017; meds adjusted w/ Entresto incr to 49/51Bid, decr Lasix20Qam, continue Aldactone25    He saw ONCOLOGY- DrMohamed on 12/03/17>  Hx RUL mass (stage IIIA non-small cell lung cancer- favoring squamous cell dis- treated w/ chemoradiation, 5 cycles w/ partial response, currently on observation;  Note reviewed, good performance status, f/u CT Chest 11/21/17 (see below) no findings to suggest new or progressive dis, REC to continue observation w/ f/u CT Chest & OV in 5mo..     He saw PCP- DrLuking 12/06/17>  HBP, HL, followed by Cards & Pulm;  Stable, same meds, asked to f/u 617mo. We summarized his on-going cardio-pulm issues>       COPD- GOLD Stage3 w/ revers component, & freq infectious exac> rec to be diligent w/ NEB treatments, Symbicort, GFN, etc...     Hx non-small cell lung cancer, stage IIIA- dx 10/2015 & treated by DrMohamed & DrManning w/ chemoradiation; he is due for 33m18moV w/ oncology 11/2017...    Abn CXR- s/p treatment as noted, subseq RUL opac (?infection vs radiation fibrosis?) & right effusion=> VATS w/ talc pleurodesis (no malig cells)...    CAD, s/pCABG1996, RBBB, ischemic cardiomyopathy w/ acute on chr sys & diast CHF> followed by DrMcLean's CHF clinic...    ASPVD- s/p AAA stent graft 2014, mod bilat carotid dis...    Medical issues> HBP, HL, GERD/ divertics/ polyps, CKD-stage3 (Cr~1.6), anemia, etc...  EXAM shows Afeb, VSS, O2sat=100% on RA; Wt=187#;  HEENT- neg, mallampati2;  Chest- sl decr BS right base, few rhonchi, w/o w/r/consolidation;  Heart- RR Gr1/6SEM w/o r/g;  Abd- soft, neg;  Ext- neg w/o c/c/e...  CT Chest 11/21/17>  IMPRESSION:    1. Similar appearance of changes due to external beam radiation within the right lung and medial left upper lobe. No findings to suggest new or progressive disease.    2. Stable appearance of loculated right-sided pleural effusion status post talc pleurodesis.    3. Stable borderline subcarinal adenopathy.    4. Stable T4 compression fracture.  LABS 11/26/17 in Epic>  Chems- ok x Cr=1.52 (stable);  CBC- ok x Hg=11.8 IMP/PLAN>>  BilRoyce remarkably stable w/ his severe pulmonary & cardiac diseases;  We reviewed his medication regimen & rec to continue same;  We reviewed diet & exercise prescriptions;  We plan rov recheck [id number]...   ~  April 03, 2018:  44mo 544mo& Paul Paul Landmarkins stoic & doing well he says; he continues to exercise regularly by walking indoors 45-50 min 5d per week + yard work (mowing, weed eating, schrubs);  Even played 9 holes of golf;  He continues to be followed by CARDS- DrMcLean [facility] last done 02/2018 & [place]  We reviewed the following interval medical notes since his last visit Jan2019>      He saw CARDS- DrMcLean on  01/06/18>  CAD, s/p CABG in 1996, w/ DES to [place] & 2 PCIs in 2015, ischemic cardiomyopathy/ CHF w/ EF down to 20-25% in 02/2017; RBBB, HL;  note reviewed-- meds adjusted w/ Entresto decr back to 24/26Bid due to orthostatic & low BP, incr Coreg to 12.5Bid, cont Lasix20Qam, cont Aldactone25... We summarized his on-going cardio-pulm issues>      COPD- GOLD Stage3 w/ revers component, & freq infectious exac> rec to be diligent w/ NEB treatments, Symbicort,  GFN, etc...     Hx non-small cell lung cancer, stage IIIA- dx 10/2015 & treated by DrMohamed & DrManning w/ chemoradiation; last f/u CT Chest from [facility]COL[facility] 10/2017 w/o signs of recurrence or progressive dis; s/p XRT & talc pleurodesis...    Abn CXR- s/p treatment as noted, subseq RUL opac (?infection vs radiation fibrosis?) & right effusion=> VATS w/ talc pleurodesis (no malig cells) 02/2017 by DrBartle...    CAD, s/pCABG1996, RBBB, ischemic cardiomyopathy w/ acute on chr sys & diast CHF> followed by DrMcLean's CHF clinic...    ASPVD- s/p AAA stent graft 2014, mod bilat carotid dis...    Medical issues> HBP, HL, GERD/ divertics/ polyps, CKD-stage3 (Cr~1.6), anemia, etc...  EXAM shows Afeb, VSS, O2sat=99% on RA; Wt=188#;  HEENT- neg, mallampati2;  Chest- sl decr BS right base, few rhonchi, w/o w/r/consolidation;  Heart- RR Gr1/6SEM w/o r/g;  Abd- soft, neg;  Ext- neg w/o c/c/e...  CXR 04/03/18 (independently reviewed by me in the PACS system) showed norm heart size, aortic atherosclerotic calcif, vol loss on right w/ mediastinal shift, chr opac RUL/ right perihilar area/ right basedue to radiation fibrosis/ scarring, left lung is clear -- chr post-therapy changes w/ right effusion/ atelectasis/ scarring...   LABS from the [place] 02/2018>  FLP- at goals w/ TChol=143, TG=134, HDL=41, LDL=75;  Chems- ok w/ BS=92, A1c=5.7,  Cr=1.51, LFTs- wnl;  CBC- ok w/ Hg=12.2, WBC=8.2;  TSH=2.49 IMP/PLAN>>  Paul Sandoval is rec to continue his same meds- Medrol13mQod, Duoneb Qid, Symbicort160-2spBid, GFN 400-2tabsTid;  Continue meds and f/u w/ drMcLean (he has f/u 2DEcho soon);  We will continue 323monterval visits & asked to call prn any changes in his status...    Past Medical History:  Diagnosis Date  . AAA (abdominal aortic aneurysm) ([place]   4.4 cm 08/2008;4.44 in 7/10 and 4.65 in 08/2009; 4.8 by CT in 11/2009; 4.3 by ultrasound in 08/2010  . Anemia   . Arteriosclerotic cardiovascular disease (ASCVD) 1996   CABG-1996  . Arthritis    "fingers" (03/18/2014)  . CAD (coronary artery disease)    03/18/14:  PCI with DES to distal left main. 7/29: DES to the SVG to Diag  . Cancer ([facility] [place]   Upper right lobe lung cancer  . Cardiomyopathy, ischemic    Echo 03/17/14: EF 45-50%  . Chronic bronchitis ([place]  . Chronic kidney disease    CRF  . Chronic rhinitis   . Colonic polyp 2002   polypectomy in 2002  . COPD (chronic obstructive pulmonary disease) ([place]  . Diverticulosis   . Dyspnea    with exertion  . ED (erectile dysfunction)   . Encounter for antineoplastic chemotherapy 12/19/2015  . GERD (gastroesophageal reflux disease)   . History of blood transfusion   . Hyperlipidemia   . Hypertension   . IFG (impaired fasting glucose)   . [facility] ([facility]   "told h/o silent MI sometime before 1996"  . Pneumonia ~ 2001; ~ 2005    has had more than twice  . Right bundle branch block   . Tobacco abuse, in remission    40 pack year total consumption; discontinued in 1996    Past Surgical History:  Procedure Laterality Date  . ABDOMINAL AORTIC ANEURYSM REPAIR  11/2012  . ABDOMINAL AORTIC ENDOVASCULAR STENT GRAFT N/A 12/11/2012   Procedure: ABDOMINAL AORTIC ENDOVASCULAR STENT GRAFT;  Surgeon: JaMal MistyMD;  [place] [place] Service: [facility];  Laterality: N/A;  Ultrasound guided; [place]  . CARDIAC  CATHETERIZATION   01/08/1995  . COLONOSCOPY  2002   polypectomy-patient denies  . CORONARY ANGIOPLASTY WITH STENT PLACEMENT  03/18/2014   "1"  . CORONARY ANGIOPLASTY WITH STENT PLACEMENT  03/24/2014   "1"  . CORONARY ARTERY BYPASS GRAFT  01/09/1995   "CABG X3"  . FLEXIBLE BRONCHOSCOPY N/A 03/01/2017   Procedure: FLEXIBLE BRONCHOSCOPY WITH BIOPSIES;  Surgeon: Gaye Pollack, MD;  Location: [facility] OR;  Service: Thoracic;  Laterality: N/A;  . JOINT REPLACEMENT    . LAPAROSCOPIC CHOLECYSTECTOMY  12/2009  . LEFT AND RIGHT HEART CATHETERIZATION WITH CORONARY/GRAFT ANGIOGRAM N/A 03/18/2014   Procedure: LEFT AND RIGHT HEART CATHETERIZATION WITH Beatrix Fetters;  Surgeon: Blane Ohara, MD;  Location: [facility] CATH LAB;  Service: Cardiovascular;  Laterality: N/A;  . PERCUTANEOUS CORONARY STENT INTERVENTION (PCI-S)  03/18/2014   Procedure: PERCUTANEOUS CORONARY STENT INTERVENTION (PCI-S);  Surgeon: Blane Ohara, MD;  Location: [facility] CATH LAB;  Service: Cardiovascular;;  . PERCUTANEOUS CORONARY STENT INTERVENTION (PCI-S) N/A 03/24/2014   Procedure: PERCUTANEOUS CORONARY STENT INTERVENTION (PCI-S);  Surgeon: Blane Ohara, MD;  Location[facility] C[facility] LAB;  Service: Cardiovascular;  Laterality: N/A;  . PLEURAL EFFUSION DRAINAGE Right 03/01/2017   Procedure: DRAINAGE OF PLEURAL EFFUSION;  Surgeon: Gaye Pollack, MD;  Location: [place];  Service: Thoracic;  Laterality: Right;  . RIGHT/LEFT HEART CATH AND CORONARY/GRAFT ANGIOGRAPHY N/A 05/15/2017   Procedure: RIGHT/LEFT HEART CATH AND CORONARY/GRAFT ANGIOGRAPHY;  Surgeon: Larey Dresser, MD;  Location: [place] CV LAB;  Service: Cardiovascular;  Laterality: N/A;  . TALC PLEURODESIS Right 03/01/2017   Procedure: Pietro Cassis;  Surgeon: Gaye Pollack, MD;  Location: [place];  Service: Thoracic;  Laterality: Right;  . TOTAL HIP ARTHROPLASTY Left 01/21/2013   Procedure: TOTAL HIP ARTHROPLASTY ANTERIOR APPROACH;  Surgeon: Mauri Pole, MD;  Location: [place];  Service: Orthopedics;   Laterality: Left;  Marland Kitchen VIDEO ASSISTED THORACOSCOPY Right 03/01/2017   Procedure: VIDEO ASSISTED THORACOSCOPY WITH BIOPSIES;  Surgeon: Gaye Pollack, MD;  Location: [place];  Service: Thoracic;  Laterality: Right;  Marland Kitchen VIDEO BRONCHOSCOPY N/A 11/17/2015   Procedure: VIDEO BRONCHOSCOPY WITH FLUORO;  Surgeon: Rigoberto Noel, MD;  Location: [place];  Service: Cardiopulmonary;  Laterality: N/A;    Outpatient Encounter Medications as of 04/03/2018  Medication Sig  . acetaminophen (TYLENOL) 500 MG tablet Take 500 mg by mouth every 6 (six) hours as needed for mild pain.  Marland Kitchen aspirin EC 81 MG tablet Take 81 mg by mouth daily before breakfast.   . budesonide-formoterol (SYMBICORT) 160-4.5 MCG/ACT inhaler Inhale 2 puffs into the lungs 2 (two) times daily.  . carvedilol (COREG) 12.5 MG tablet Take 1 tablet (12.5 mg total) by mouth 2 (two) times daily with a meal.  . clopidogrel (PLAVIX) 75 MG tablet Take 1 tablet (75 mg total) by mouth daily. Please make overdue appt with Dr. Johnsie Cancel before anymore refills. 3rd and Final Attempt  . ENTRESTO 24-26 MG TAKE (1) TABLET BY MOUTH TWICE DAILY.  Marland Kitchen Ferrous Sulfate (IRON) 28 MG TABS Take 28 mg by mouth daily.  . furosemide (LASIX) 20 MG tablet TAKE 1 TABLET BY MOUTH TWICE DAILY.  . GuaiFENesin (MUCUS RELIEF ADULT PO) Take 2 capsules by mouth 3 (three) times daily.  Marland Kitchen ipratropium-albuterol (DUONEB) 0.5-2.5 (3) MG/3ML SOLN INHALE 1 VIAL VIA NEBULIZER 4 TIMES DAILY.  . methylPREDNISolone (MEDROL) 4 MG tablet Take 1 tablet (4 mg total) by mouth every other day.  . Multiple Vitamins-Minerals (CENTRUM SILVER ADULT 50+) TABS Take 1  tablet by mouth daily with lunch.   Marland Kitchen NITROSTAT 0.4 MG SL tablet PLACE 1 TAB UNDER TONGUE EVERY 5 MIN IF NEEDED FOR CHEST PAIN. MAY USE 3 TIMES.NO RELIEF CALL 911.  . omeprazole (PRILOSEC) 20 MG capsule TAKE ONE CAPSULE BY MOUTH DAILY.  Marland Kitchen PROAIR HFA 108 (90 Base) MCG/ACT inhaler INHALE 2 PUFFS BY MOUTH EVERY 4 TO 6 HOURS AS NEEDED FOR WHEEZING.  .  Probiotic Product (FLORAJEN3 PO) Take 1 tablet by mouth daily.  . simvastatin (ZOCOR) 80 MG tablet Take 0.5 tablets (40 mg total) by mouth at bedtime.  Marland Kitchen spironolactone (ALDACTONE) 25 MG tablet TAKE 1 TABLET BY MOUTH DAILY.  . [DISCONTINUED] ipratropium-albuterol (DUONEB) 0.5-2.5 (3) MG/3ML SOLN INHALE 1 VIAL VIA NEBULIZER 4 TIMES DAILY.  . [DISCONTINUED] methylPREDNISolone (MEDROL) 4 MG tablet Take 1 tablet (4 mg total) by mouth every other day.   No facility-administered encounter medications on file as of 04/03/2018.     Allergies  Allergen Reactions  . Neomycin Hives    Immunization History  Administered Date(s) Administered  . H1N1 08/06/2008  . Influenza Split 06/24/2013  . Influenza,inj,Quad PF,6+ Mos 06/15/2014, 05/31/2015, 05/09/2016, 06/06/2017  . Influenza-Unspecified 05/27/2012  . Pneumococcal Conjugate-13 08/18/2014  . Pneumococcal Polysaccharide-23 03/27/2012  . Zoster 07/27/2008    Current Medications, Allergies, Past Medical History, Past Surgical History, Family History, and Social History were reviewed in [employer] record.   Review of Systems             All symptoms NEG except where BOLDED >>  Constitutional:  F/C/S, fatigue, anorexia, unexpected weight change. HEENT:  HA, visual changes, hearing loss, earache, nasal symptoms, sore throat, mouth sores, hoarseness. Resp:  cough, sputum, hemoptysis; SOB, tightness, wheezing. Cardio:  CP, palpit, DOE, orthopnea, edema. GI:  N/V/D/C, blood in stool; reflux, abd pain, distention, gas. GU:  dysuria, freq, urgency, hematuria, flank pain, voiding difficulty. MS:  joint pain, swelling, tenderness, decr ROM; neck pain, back pain, etc. Neuro:  HA, tremors, seizures, dizziness, syncope, weakness, numbness, gait abn. Skin:  suspicious lesions or skin rash. Heme:  adenopathy, bruising, bleeding. Psyche:  confusion, agitation, sleep disturbance, hallucinations, anxiety, depression  suicidal.   Objective:   Physical Exam       Vital Signs:  Reviewed...   General:  WD, WN, 77 y/o WM in NAD; alert & oriented; pleasant & cooperative... HEENT:  [place]/AT; Conjunctiva- pink, Sclera- nonicteric, EOM-wnl, PERRLA, EACs-clear, TMs-wnl; NOSE-clear; THROAT-clear & wnl.  Neck:  Supple w/ fair ROM; no JVD; normal carotid impulses w/ faint bruits; no thyromegaly or nodules palpated; no lymphadenopathy.  Chest:  decr BS right base & clear w/o w/r/r & no signs of consolid. Heart:  Regular Rhythm; norm S1 & S2 w/ Gr1/6 SEM without rubs or gallops detected. Abdomen:  Soft & nontender- no guarding or rebound; normal bowel sounds; no organomegaly or masses palpated. Ext:  Sl decr ROM; without deformities +arthritic changes; no varicose veins, +venous insuffic, tr edema;  Pulses decr w/o bruits. Neuro:  CNs II-XII intact; motor testing normal; sensory testing normal; gait normal & balance OK. Derm:  No lesions noted; no rash etc. Lymph:  No cervical, supraclavicular, axillary, or inguinal adenopathy palpated.   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     ASPVD- s/p AAA stent graft 11/2012, mod bilat carotid occlusive dis (asymptomatic) w/ CDopplers showing ~50-60% bilat ICAstenoses>  Stable & [facility] 01/2016- plans yearly f/u CT Abd and CDopplers    MEDICAL issues>  HBP, HL,  GERD/ Divertic/ Polyps, CKD-stage3, Anemia & thrombocytopenia>  On Simva80-1/2Qd, Prilosec20/d, Fe/ MVI/ etc...  PLAN>>  02/29/16>   Problem list as above w/ signif cardiac, pulmonary, & post chemoradiation changes;  From the pulm standpoint- he feels the BREO is helping & hasn't needed the rescue inhaler very often, continue same;  He is on a long PRED taper per DrManning for poss radiation fibrosis RUL, Sed rate is 53, continue this taper & careful w/ sweets/ sugar etc;  From the cardiac standpoint- he has signif underlying dis w/ L[place] stent, s/p CABG 20 yrs ago, prob ischemic cardiomyopathy w/ worsening 2DEcho recently w/ 30-35% EF + HK&AK seen, BNP=1053, Cards plans ischemic work up when able, in the meanwhile he has improved w/ diuresis but Chems w/ mild RI & HCO3=36... REC- add DIAMOX-ER 500 one tab daily at 4pm, continue his Lasix40 Qam, increase water intake, watch weights... We plan ROV 833monthrecheck... 04/04/16>   Discussed w/ Paul Sandoval-- continue the Levaquin til gone, use Tylenol for fever or pain; continue the Breo daily & Ventolin-HFA as needed; wean off the Diamox at this time, continue Lasix40, no salt, etc; he needs to gradually increase his exercise/ mobility; we will plan ROV w/ labs in 271mo 06/06/16>   He is feeling better, performance status improving, CXR shows progressive changes in RUL s/p chemoradiation; CT=>PET scans w/ vol loss & persistent hypermetabolism/ evolving changes in RUL & right hilum plus he has a mod large right effusion; prev thoracentesis 01/2016 in hosp was transudative- I believe he would benefit from further eval/ repeat thoracentesis to recheck this fluid; we will set this up via IR & ask them to drain the fluid as much as poss, send it for Cytology, cell ct & diff, TProt/ LDH/ Gluc => 1.2L removed, prob exudate, NEG cytology, & we will follow... 08/06/16>   BiQuaviss clinically stable, no clear evid of recurrent dis & followed very closely by DrMohamed; Pulm stable on  Symbicort Bid, Albut rescue prn, and his exercise- continue same 7 we plan rov recheck in 33m65mo12/18>    BilJeovany doing satis on Symbicort160-2spBid & Albut rescue inhaler prn; he states that he is active, energy is good, & that he's getting stronger & better (good performance status); I suspect that he has residual cancer in the right chest but it appears slowly progressive (w/ sl incr right effusion & subcarinal LN) and DrMohamed to review status & decide regarding additional therapy at this point vs continued observation...  01/02/17>   BilMj improved but it's been a slow process; he is at high risk for cancer progression & followed closely by DrMohamed who plans f/u CT & recheck in June;  Continue current meds, add Guaifenesin 2400m27m+ water to aid in mucus production... We will recheck in 27mo 46moview his f/u scans planned by Oncology.  02/04/17>   SEE ABOVE - Rx Pred taper & Levaquin=> no better; R-Thoracentesis by IR w/ only 280cc exudative loculated fluid removed, cytology- NEG; Limited options at this point- Rx w/ Augmentin empirically, add NEBs w/ Albuterol Tid, continue GFN 2439m/d;  We will check 2DEcho and refer to TCTS to consider further Rx options for right effusion... 03/13/17>   The worsening LVF w/ EF 222-29%is certainly playing a roll in his unrespolved dyspnea=> refer to CHF clinic for active management; in the interim continue NEBs, Symbicort, Mucinex, etc.. 04/24/17>   BAlessanderappears stable post T-surg & improved w/ CHF-clinic Rx per DrMcLean;  OK 2018 FLU shot today, and continue pulm rx w/ Albut NEBS Tid, Symbicort160-2spBid, GFN400-2Bid;  Call for any questions and we plan f/u visit in 278mo10/29/18>   As above- we discussed treating THIS COPD exac w/ BacterimDS & PRED taper; he is to continue w/ his NEBS, Symbicort, GFN and we plan recheck in 6wks time 08/08/17>   As noted above Paul Sandoval having a rough time lately>  He had the right VATS surg & talc pleurodesis 02/2017;  He has a  severe ischemic cardiomyopathy w/ EF= 20-25% & now followed closely by DrMcLean w/ freq (Q2m17moisits in the CHF clinic for medication titration;  He has severe obstructive lung dis w/ FEV1=1.25 (40%) in 01/2016 and freq exacerbations as documented above;  We reviewed recommendations for NEBS w/ DUONEB Qid, Symbicort160-2spBid, GFN400-2tabs Tid w/ fluids;  We are starting a 10d course of AUGMENTIN875Bid and a long slow MEDROL taper w/ planned recheck in 6wks;  He will continue to work on chest physiotherapy & bronchial mucus hygiene 09/19/17>   Paul required mult antibiotics to get over his recent upper resp infection=> ZPak, then Bacterim, then Augmentin, then Omnicef; he remains on Medrol4mg19m & is asked to stay on this; he requested follow up labs (as above); reminded to stay as active as poss w/ a regular exercise program...  12/19/17>   Paul Sandoval is remarkably stable w/ his severe pulmonary & cardiac diseases;  We reviewed his medication regimen & rec to continue same;  We reviewed diet & exercise prescriptions;  We plan rov recheck 17mo 81moXR. 04/03/18>   Paul Paul Landmarkec to continue his same meds- Medrol4mgQo15mDuoneb Qid, Symbicort160-2spBid, GFN 400-2tabsTid;  Continue meds and f/u w/ drMcLean (he has f/u 2DEcho soon);  We will continue 17mo in81moal visits & asked to call prn any changes in his status.   Plan:      Patient's Medications  New Prescriptions   No medications on file  Previous Medications   ACETAMINOPHEN (TYLENOL) 500 MG TABLET    Take 500 mg by mouth every 6 (six) hours as needed for mild pain.   ASPIRIN EC 81 MG TABLET    Take 81 mg by mouth daily before breakfast.    BUDESONIDE-FORMOTEROL (SYMBICORT) 160-4.5 MCG/ACT INHALER    Inhale 2 puffs into the lungs 2 (two) times daily.   CARVEDILOL (COREG) 12.5 MG TABLET    Take 1 tablet (12.5 mg total) by mouth 2 (two) times daily with a meal.   CLOPIDOGREL (PLAVIX) 75 MG TABLET    Take 1 tablet (75 mg total) by mouth daily. Please make overdue  appt with Dr. Nishan Johnsie Cancel anymore refills. 3rd and Final Attempt   ENTRESTO 24-26 MG    TAKE (1) TABLET BY MOUTH TWICE DAILY.   FERROUS SULFATE (IRON) 28 MG TABS    Take 28 mg by mouth daily.  FUROSEMIDE (LASIX) 20 MG TABLET    TAKE 1 TABLET BY MOUTH TWICE DAILY.   GUAIFENESIN (MUCUS RELIEF ADULT PO)    Take 2 capsules by mouth 3 (three) times daily.   IPRATROPIUM-ALBUTEROL (DUONEB) 0.5-2.5 (3) MG/3ML SOLN    INHALE 1 VIAL VIA NEBULIZER 4 TIMES DAILY.   MULTIPLE VITAMINS-MINERALS (CENTRUM SILVER ADULT 50+) TABS    Take 1 tablet by mouth daily with lunch.    NITROSTAT 0.4 MG SL TABLET    PLACE 1 TAB UNDER TONGUE EVERY 5 MIN IF NEEDED FOR CHEST PAIN. MAY USE 3 TIMES.NO RELIEF CALL 911.   OMEPRAZOLE (PRILOSEC) 20 MG CAPSULE    TAKE ONE CAPSULE BY MOUTH DAILY.   PROAIR HFA 108 (90 BASE) MCG/ACT INHALER    INHALE 2 PUFFS BY MOUTH EVERY 4 TO 6 HOURS AS NEEDED FOR WHEEZING.   PROBIOTIC PRODUCT (FLORAJEN3 PO)    Take 1 tablet by mouth daily.   SIMVASTATIN (ZOCOR) 80 MG TABLET    Take 0.5 tablets (40 mg total) by mouth at bedtime.   SPIRONOLACTONE (ALDACTONE) 25 MG TABLET    TAKE 1 TABLET BY MOUTH DAILY.  Modified Medications   Modified Medication Previous Medication   METHYLPREDNISOLONE (MEDROL) 4 MG TABLET methylPREDNISolone (MEDROL) 4 MG tablet      Take 1 tablet (4 mg total) by mouth every other day.    Take 1 tablet (4 mg total) by mouth every other day.  Discontinued Medications   IPRATROPIUM-ALBUTEROL (DUONEB) 0.5-2.5 (3) MG/3ML SOLN    INHALE 1 VIAL VIA NEBULIZER 4 TIMES DAILY.

## 2018-04-07 ENCOUNTER — Other Ambulatory Visit: Payer: Self-pay | Admitting: Pulmonary Disease

## 2018-04-07 ENCOUNTER — Telehealth: Payer: Self-pay | Admitting: Pulmonary Disease

## 2018-04-07 ENCOUNTER — Other Ambulatory Visit (HOSPITAL_COMMUNITY): Payer: Self-pay | Admitting: Cardiology

## 2018-04-07 MED ORDER — IPRATROPIUM-ALBUTEROL 0.5-2.5 (3) MG/3ML IN SOLN: RESPIRATORY_TRACT | 0 refills | Status: DC

## 2018-04-07 NOTE — Telephone Encounter (Signed)
Called and spoke with patient, he is requesting refill of medication. Pharmacy verified. Nothing further needed.

## 2018-04-09 ENCOUNTER — Other Ambulatory Visit: Payer: Self-pay

## 2018-04-09 ENCOUNTER — Ambulatory Visit (HOSPITAL_COMMUNITY)
Admission: RE | Admit: 2018-04-09 | Discharge: 2018-04-09 | Disposition: A | Discharge status: Home / Self Care | Payer: Medicare Other | Source: Ambulatory Visit | Attending: Cardiology | Admitting: Cardiology

## 2018-04-09 ENCOUNTER — Other Ambulatory Visit (HOSPITAL_COMMUNITY): Payer: Self-pay | Admitting: *Deleted

## 2018-04-09 ENCOUNTER — Ambulatory Visit (INDEPENDENT_AMBULATORY_CARE_PROVIDER_SITE_OTHER)
Admission: RE | Admit: 2018-04-09 | Discharge: 2018-04-09 | Disposition: A | Discharge status: Home / Self Care | Payer: Medicare Other | Source: Ambulatory Visit | Attending: Cardiology | Admitting: Cardiology

## 2018-04-09 VITALS — BP 122/62 | HR 75 | Wt 185.2 lb

## 2018-04-09 DIAGNOSIS — Z7902 Long term (current) use of antithrombotics/antiplatelets: Secondary | ICD-10-CM | POA: Diagnosis not present

## 2018-04-09 DIAGNOSIS — Z79899 Other long term (current) drug therapy: Secondary | ICD-10-CM | POA: Insufficient documentation

## 2018-04-09 DIAGNOSIS — C3411 Malignant neoplasm of upper lobe, right bronchus or lung: Secondary | ICD-10-CM

## 2018-04-09 DIAGNOSIS — E785 Hyperlipidemia, unspecified: Secondary | ICD-10-CM | POA: Insufficient documentation

## 2018-04-09 DIAGNOSIS — I5042 Chronic combined systolic (congestive) and diastolic (congestive) heart failure: Secondary | ICD-10-CM | POA: Diagnosis not present

## 2018-04-09 DIAGNOSIS — I255 Ischemic cardiomyopathy: Secondary | ICD-10-CM | POA: Insufficient documentation

## 2018-04-09 DIAGNOSIS — I252 Old myocardial infarction: Secondary | ICD-10-CM | POA: Diagnosis not present

## 2018-04-09 DIAGNOSIS — Z87891 Personal history of nicotine dependence: Secondary | ICD-10-CM | POA: Insufficient documentation

## 2018-04-09 DIAGNOSIS — N183 Chronic kidney disease, stage 3 (moderate): Secondary | ICD-10-CM | POA: Diagnosis not present

## 2018-04-09 DIAGNOSIS — J449 Chronic obstructive pulmonary disease, unspecified: Secondary | ICD-10-CM | POA: Diagnosis not present

## 2018-04-09 DIAGNOSIS — J90 Pleural effusion, not elsewhere classified: Secondary | ICD-10-CM | POA: Insufficient documentation

## 2018-04-09 DIAGNOSIS — Z9221 Personal history of antineoplastic chemotherapy: Secondary | ICD-10-CM | POA: Diagnosis not present

## 2018-04-09 DIAGNOSIS — Z8679 Personal history of other diseases of the circulatory system: Secondary | ICD-10-CM | POA: Insufficient documentation

## 2018-04-09 DIAGNOSIS — Z923 Personal history of irradiation: Secondary | ICD-10-CM | POA: Diagnosis not present

## 2018-04-09 DIAGNOSIS — I4510 Unspecified right bundle-branch block: Secondary | ICD-10-CM | POA: Diagnosis not present

## 2018-04-09 DIAGNOSIS — I2510 Atherosclerotic heart disease of native coronary artery without angina pectoris: Secondary | ICD-10-CM | POA: Diagnosis not present

## 2018-04-09 DIAGNOSIS — Z951 Presence of aortocoronary bypass graft: Secondary | ICD-10-CM | POA: Diagnosis not present

## 2018-04-09 DIAGNOSIS — I25119 Atherosclerotic heart disease of native coronary artery with unspecified angina pectoris: Secondary | ICD-10-CM | POA: Insufficient documentation

## 2018-04-09 DIAGNOSIS — C3490 Malignant neoplasm of unspecified part of unspecified bronchus or lung: Secondary | ICD-10-CM | POA: Insufficient documentation

## 2018-04-09 DIAGNOSIS — I130 Hypertensive heart and chronic kidney disease with heart failure and stage 1 through stage 4 chronic kidney disease, or unspecified chronic kidney disease: Secondary | ICD-10-CM | POA: Diagnosis not present

## 2018-04-09 DIAGNOSIS — I2582 Chronic total occlusion of coronary artery: Secondary | ICD-10-CM | POA: Insufficient documentation

## 2018-04-09 MED ORDER — CARVEDILOL 12.5 MG PO TABS: ORAL_TABLET | ORAL | 3 refills | Status: DC

## 2018-04-09 MED ORDER — SACUBITRIL-VALSARTAN 49-51 MG PO TABS: 1.0000 | ORAL_TABLET | Freq: Two times a day (BID) | ORAL | 3 refills | Status: DC

## 2018-04-09 MED ORDER — PERFLUTREN LIPID MICROSPHERE
1.0000 mL | INTRAVENOUS | Status: DC | PRN
  Administered 2018-04-09: 2 mL via INTRAVENOUS
  Filled 2018-04-09: qty 10

## 2018-04-09 MED ORDER — SPIRONOLACTONE 25 MG PO TABS: 25.0000 mg | ORAL_TABLET | Freq: Every day | ORAL | 6 refills | Status: DC

## 2018-04-09 MED ORDER — CLOPIDOGREL BISULFATE 75 MG PO TABS: 75.0000 mg | ORAL_TABLET | Freq: Every day | ORAL | 0 refills | Status: DC

## 2018-04-09 MED ORDER — FUROSEMIDE 20 MG PO TABS: 20.0000 mg | ORAL_TABLET | Freq: Every day | ORAL | 3 refills | Status: DC

## 2018-04-09 MED ORDER — SIMVASTATIN 80 MG PO TABS: 40.0000 mg | ORAL_TABLET | Freq: Every day | ORAL | 5 refills | Status: DC

## 2018-04-09 NOTE — Progress Notes (Signed)
  Echocardiogram 2D Echocardiogram has been performed.  Paul Sandoval 04/09/2018, 11:29 AM

## 2018-04-09 NOTE — Progress Notes (Signed)
PCP: Dr. Wolfgang Phoenix Pulmonary: Dr. Lenna Gilford HF Cardiology: Dr. Aundra Dubin  77 y.o. with history of COPD, CAD s/p CABG in 1996 and PCI to dLM and SVG-D in 7/15, ischemic cardiomyopathy, and non-small cell lung cancer presents for followup of CHF and dyspnea.  He was initially referred to [place] by Dr. Lenna Gilford after fall in EF and worsening dyspnea was noted.   Patient reports that his initial symptoms prior to CABG in 1996 were dyspnea.  He has never had any worrisome chest pain.  He did well initially after CABG and was golfing regularly and walking 2-3 miles/day up until around 3/17.  He had one episode of increased dyspnea in 2015 and had cath with PCI to distal LM and SVG-D.  In 3/17, he was diagnosed with NSCLC and was treated with radiation and chemotherapy.  Since then, he has had progressive exertional dyspnea.  He was noted on echo in 7/18 to have EF down to 20-25%.    RHC/LHC was done in 9/18, showing occluded SVG-small D, patent LIMA-LAD and SVG-PDA, 50% shelf-like stenosis distal left main (no intervention), filling pressure not elevated and cardiac output preserved.   I increased his Entresto to 49/51 bid but BP dropped and he had orthostatic symptoms, so I decreased it back to 24/26 bid.   He is stable symptomatically.  Echo was done today, EF remains 30-35%.  He feels very good overall.  Walking 2 miles/day 5 days/week.  No dyspnea walking on flat ground, mildly short of breath with hills.  No chest pain.  No orthopnea/PND.  Occasionally playing golf.  Weight down 3 lbs.   Labs (9/17): LDL 90 Labs (7/18): K 3.5, creatinine 1.28, hgb 10.7 Labs (8/18): LDL 68 Labs (9/18): K 4.1, creatinine 1.2, hgb 11.1, plts 98 K Labs (10/18): K 4.1, creatinine 1.5 Labs (1/19): K 4.3, creatinine 1.54, LDL 80, HDL 49, LFTs normal, TSH normal Labs (4/19): K 4.3, creatinine 1.52, hgb 11.8 Labs (7/19): K 4.1, creatinine 1.5, LDL 75, LFTs normal, hgb 12.2  PMH: 1. COPD: PFTs (6/17) with severe obstructive airways  disease.  2. AAA: s/p repair with stent graft.  3. Non-small cell lung cancer: Stage IIIA, diagnosed 3/17.  He had radiation as well as chemotherapy with carboplatin and paclitaxel.   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 PNA in 6/17 6. Recurrent right pleural effusion: VATS with talc pleurodesis on right.  No malignant cells noted.  7. HTN 8. H/o THR 9. CAD: CABG 1996 with SVG-D, SVG-PDA, LIMA-LAD.   - LHC (7/15): Totally occluded RCA and LAD.  Severe distal LM stenosis.  Patent SVG-PDA, patent LIMA-LAD.  Severe disease SVG-D.  Patient had DES to distal left main and staged PCI of SVG-D.   - Cardiolite (7/17): EF 30%, prior infarction with no ischemia.  - LHC (9/18): occluded SVG-small D, patent LIMA-LAD and SVG-PDA, 50% shelf-like stenosis distal left main (no intervention).  10. Chronic systolic CHF: Ischemic cardiomyopathy.   - Echo (6/17): EF 30-35%. - Echo (7/18): EF 20-25%, moderate RV dilation with mildly decreased RV systolic function.  - RHC (9/18): mean RA 3, PA 28/9 mean 18, mean PCWP 10, CI 4.05.  - Echo (8/19): EF 30-35%, mildly decreased RV systolic function.   Social History   Socioeconomic History  . Marital status: Married    Spouse name: Not on file  . Number of children: 1  . Years of education: Not on file  . Highest education level: Not on file  Occupational History  . Occupation: Retired  Comment: [employer]  . Financial resource strain: Not on file  . Food insecurity:    Worry: Not on file    Inability: Not on file  . Transportation needs:    Medical: Not on file    Non-medical: Not on file  Tobacco Use  . Smoking status: Former Smoker    Packs/day: 1.50    Years: 30.00    Pack years: 45.00    Types: Cigarettes    Start date: 12/01/1956    Last attempt to quit: 01/08/1995    Years since quitting: 23.2  . Smokeless tobacco: Never Used  Substance and Sexual Activity  . Alcohol use: No    Alcohol/week: 0.0 standard drinks    Frequency:  Never    Comment: 03/18/2014 "no alacohol since 1996"  . Drug use: No  . Sexual activity: Never  Lifestyle  . Physical activity:    Days per week: Not on file    Minutes per session: Not on file  . Stress: Not on file  Relationships  . Social connections:    Talks on phone: Not on file    Gets together: Not on file    Attends religious service: Not on file    Active member of club or organization: Not on file    Attends meetings of clubs or organizations: Not on file    Relationship status: Not on file  . Intimate partner violence:    Fear of current or ex partner: Not on file    Emotionally abused: Not on file    Physically abused: Not on file    Forced sexual activity: Not on file  Other Topics Concern  . Not on file  Social History Narrative  . Not on file   Family History  Problem Relation Age of Onset  . Heart disease Father   . Cancer Father        Lung  . Arthritis Mother   . Parkinsonism Mother   . Arthritis Sister        Brother with rheumatoid arthritis  . Hypertension Brother   . Colon cancer Neg Hx   . Colon polyps Neg Hx    ROS: All systems reviewed and negative except as per HPI.   Current Outpatient Medications  Medication Sig Dispense Refill  . acetaminophen (TYLENOL) 500 MG tablet Take 500 mg by mouth every 6 (six) hours as needed for mild pain.    Marland Kitchen aspirin EC 81 MG tablet Take 81 mg by mouth daily before breakfast.     . budesonide-formoterol (SYMBICORT) 160-4.5 MCG/ACT inhaler Inhale 2 puffs into the lungs 2 (two) times daily.    . Ferrous Sulfate (IRON) 28 MG TABS Take 65 mg by mouth daily.     . furosemide (LASIX) 20 MG tablet Take 1 tablet (20 mg total) by mouth daily. 30 tablet 3  . GuaiFENesin (MUCUS RELIEF ADULT PO) Take 2 capsules by mouth 3 (three) times daily.    Marland Kitchen ipratropium-albuterol (DUONEB) 0.5-2.5 (3) MG/3ML SOLN INHALE 1 VIAL VIA NEBULIZER 4 TIMES DAILY. 360 mL 0  . methylPREDNISolone (MEDROL) 4 MG tablet Take 1 tablet (4 mg total)  by mouth every other day. 50 tablet 2  . Multiple Vitamins-Minerals (CENTRUM SILVER ADULT 50+) TABS Take 1 tablet by mouth daily with lunch.     Marland Kitchen NITROSTAT 0.4 MG SL tablet PLACE 1 TAB UNDER TONGUE EVERY 5 MIN IF NEEDED FOR CHEST PAIN. MAY USE 3 TIMES.NO RELIEF CALL 911. 25  tablet 1  . omeprazole (PRILOSEC) 20 MG capsule TAKE ONE CAPSULE BY MOUTH DAILY. 90 capsule 1  . PROAIR HFA 108 (90 Base) MCG/ACT inhaler INHALE 2 PUFFS BY MOUTH EVERY 4 TO 6 HOURS AS NEEDED FOR WHEEZING. 8.5 g 0  . Probiotic Product (FLORAJEN3 PO) Take 1 tablet by mouth daily.    . carvedilol (COREG) 12.5 MG tablet TAKE 1 TABLET BY MOUTH TWICE DAILY WITH A MEAL. 60 tablet 3  . clopidogrel (PLAVIX) 75 MG tablet Take 1 tablet (75 mg total) by mouth daily. 30 tablet 0  . sacubitril-valsartan (ENTRESTO) 49-51 MG Take 1 tablet by mouth 2 (two) times daily. 60 tablet 3  . simvastatin (ZOCOR) 80 MG tablet Take 0.5 tablets (40 mg total) by mouth at bedtime. 15 tablet 5  . spironolactone (ALDACTONE) 25 MG tablet Take 1 tablet (25 mg total) by mouth daily. 30 tablet 6   No current facility-administered medications for this encounter.    BP 122/62   Pulse 75   Wt 84 kg (185 lb 3.2 oz)   SpO2 99%   BMI 26.57 kg/m  General: NAD Neck: No JVD, no thyromegaly or thyroid nodule.  Lungs: Rhonchi bilaterally CV: Nondisplaced PMI.  Heart regular S1/S2, no S3/S4, no murmur.  No peripheral edema.  No carotid bruit.  Normal pedal pulses.  Abdomen: Soft, nontender, no hepatosplenomegaly, no distention.  Skin: Intact without lesions or rashes.  Neurologic: Alert and oriented x 3.  Psych: Normal affect. Extremities: No clubbing or cyanosis.  HEENT: Normal.   Assessment/Plan: 1. Chronic systolic CHF: Ischemic cardiomyopathy.  Echo in 7/18 with EF 20-25%, echo was done today and reviewed and shows EF 30-35%.  Currently, NYHA class II symptoms and feeling good overall.  He is not volume overloaded on exam. He feels like Delene Loll has helped.   - I will try again to increase Entresto to 49/51 bid. He will also decrease Lasix to 20 mg once daily. BMET in 10 days.  - Continue Coreg 12.5 mg bid.   - Continue spironolactone 25 mg daily.  - EF persistently low, qualifies for ICD.  He wants to hold off until next appt with Dr. Earlie Server when he will have another CT.  If lung cancer remains quiescent, I think he would be a reasonable ICD candidate. He has a RBBB so not good CRT candidate.  2. CAD: s/p CABG then PCI 7/15 to distal LM and SVG-D.  Anginal equivalent in the past appears to have been dyspnea, he has never had significant chest pain.  LHC (9/18) showed patent LIMA-LAD and SVG-PDA; occluded SVG-D; 50% shelf-like LM stenosis - Continue ASA 81, Plavix, and simvastatin.  3. COPD: Severe COPD by PFTs in 6/17.  Based on the last RHC/LHC, I suspect that COPD plays a major role in his mild ongoing dyspnea.   4. Hyperlipidemia: Lipids acceptable in 7/19.  5. NSCLC: Follows with Dr. Earlie Server.  He has finished chemo/radiation and will get scans to assess for residual disease. He does not appear to have had any significantly cardiotoxic agents with chemotherapy. If his followup appointment with Dr. Earlie Server shows ongoing quiescence of lung cancer, would recommend ICD.  6. Right pleural effusion: This has been chronic. S/p VATS.   Followup in 3 months.   Loralie Champagne 04/09/2018

## 2018-04-09 NOTE — Patient Instructions (Signed)
INCREASE Entresto to 49-51 mg Twice Daily  DECREASE Lasix to 20 mg Once Daily  Follow up in 3 months.

## 2018-04-10 ENCOUNTER — Other Ambulatory Visit: Payer: Self-pay | Admitting: Cardiovascular Disease

## 2018-04-10 DIAGNOSIS — C3411 Malignant neoplasm of upper lobe, right bronchus or lung: Secondary | ICD-10-CM

## 2018-04-10 NOTE — Telephone Encounter (Signed)
der Providers   Prescribing Provider Encounter Provider  Larey Dresser, MD Darron Doom, RN  Outpatient Medication Detail    Disp Refills Start End   clopidogrel (PLAVIX) 75 MG tablet 30 tablet 0 04/09/2018    Sig - Route: Take 1 tablet (75 mg total) by mouth daily. - Oral   Sent to pharmacy as: clopidogrel (PLAVIX) 75 MG tablet   Notes to Pharmacy: Please call our office to schedule an overdue yearly appointment with Dr. Johnsie Cancel before anymore refills. [phone]. Thank you 3rd and Final Attempt   E-Prescribing Status: Receipt confirmed by pharmacy (04/09/2018 12:22 PM EDT)   Associated Diagnoses   Primary cancer of right upper lobe of lung ([place], [place]

## 2018-04-18 ENCOUNTER — Ambulatory Visit (HOSPITAL_COMMUNITY)
Admission: RE | Admit: 2018-04-18 | Discharge: 2018-04-18 | Disposition: A | Discharge status: Home / Self Care | Payer: Medicare Other | Source: Ambulatory Visit | Attending: Internal Medicine | Admitting: Internal Medicine

## 2018-04-18 DIAGNOSIS — I5042 Chronic combined systolic (congestive) and diastolic (congestive) heart failure: Secondary | ICD-10-CM | POA: Insufficient documentation

## 2018-04-18 LAB — BASIC METABOLIC PANEL
Anion gap: 5 (ref 5–15)
BUN: 12 mg/dL (ref 8–23)
CALCIUM: 8.9 mg/dL (ref 8.9–10.3)
CHLORIDE: 103 mmol/L (ref 98–111)
CO2: 30 mmol/L (ref 22–32)
CREATININE: 1.49 mg/dL — AB (ref 0.61–1.24)
GFR calc non Af Amer: 43 mL/min — ABNORMAL LOW (ref >60)
GFR, EST AFRICAN AMERICAN: 50 mL/min — AB (ref >60)
Glucose, Bld: 93 mg/dL (ref 70–99)
Potassium: 4.3 mmol/L (ref 3.5–5.1)
SODIUM: 138 mmol/L (ref 135–145)

## 2018-04-28 ENCOUNTER — Other Ambulatory Visit: Payer: Self-pay | Admitting: Family Medicine

## 2018-04-28 DIAGNOSIS — J449 Chronic obstructive pulmonary disease, unspecified: Secondary | ICD-10-CM | POA: Diagnosis not present

## 2018-05-06 ENCOUNTER — Telehealth: Payer: Self-pay | Admitting: Pulmonary Disease

## 2018-05-06 MED ORDER — LEVOFLOXACIN 500 MG PO TABS: 500.0000 mg | ORAL_TABLET | Freq: Every day | ORAL | 0 refills | Status: DC

## 2018-05-06 NOTE — Telephone Encounter (Signed)
Spoke with pt, he states he has been coughing up green and yellow mucus and would like an antibiotic called in for his symptoms. Denies fever and SOB but feels he may be starting to get an infection. He would like to get a handle on it before it gets worse. SN please advise.   He would like [place] to leave a detailed message on his machine if he is not home because he has another doctor's appt.   [facility]   Current Outpatient Medications on File Prior to Visit  Medication Sig Dispense Refill  . acetaminophen (TYLENOL) 500 MG tablet Take 500 mg by mouth every 6 (six) hours as needed for mild pain.    Marland Kitchen aspirin EC 81 MG tablet Take 81 mg by mouth daily before breakfast.     . budesonide-formoterol (SYMBICORT) 160-4.5 MCG/ACT inhaler Inhale 2 puffs into the lungs 2 (two) times daily.    . carvedilol (COREG) 12.5 MG tablet TAKE 1 TABLET BY MOUTH TWICE DAILY WITH A MEAL. 60 tablet 3  . clopidogrel (PLAVIX) 75 MG tablet Take 1 tablet (75 mg total) by mouth daily. 30 tablet 0  . Ferrous Sulfate (IRON) 28 MG TABS Take 65 mg by mouth daily.     . furosemide (LASIX) 20 MG tablet Take 1 tablet (20 mg total) by mouth daily. 30 tablet 3  . GuaiFENesin (MUCUS RELIEF ADULT PO) Take 2 capsules by mouth 3 (three) times daily.    Marland Kitchen ipratropium-albuterol (DUONEB) 0.5-2.5 (3) MG/3ML SOLN INHALE 1 VIAL VIA NEBULIZER 4 TIMES DAILY. 360 mL 0  . methylPREDNISolone (MEDROL) 4 MG tablet Take 1 tablet (4 mg total) by mouth every other day. 50 tablet 2  . Multiple Vitamins-Minerals (CENTRUM SILVER ADULT 50+) TABS Take 1 tablet by mouth daily with lunch.     Marland Kitchen NITROSTAT 0.4 MG SL tablet PLACE 1 TAB UNDER TONGUE EVERY 5 MIN IF NEEDED FOR CHEST PAIN. MAY USE 3 TIMES.NO RELIEF CALL 911. 25 tablet 1  . omeprazole (PRILOSEC) 20 MG capsule TAKE ONE CAPSULE BY MOUTH DAILY. 90 capsule 0  . PROAIR HFA 108 (90 Base) MCG/ACT inhaler INHALE 2 PUFFS BY MOUTH EVERY 4 TO 6 HOURS AS NEEDED FOR WHEEZING. 8.5 g 0  . Probiotic Product  (FLORAJEN3 PO) Take 1 tablet by mouth daily.    . sacubitril-valsartan (ENTRESTO) 49-51 MG Take 1 tablet by mouth 2 (two) times daily. 60 tablet 3  . simvastatin (ZOCOR) 80 MG tablet Take 0.5 tablets (40 mg total) by mouth at bedtime. 15 tablet 5  . spironolactone (ALDACTONE) 25 MG tablet Take 1 tablet (25 mg total) by mouth daily. 30 tablet 6   No current facility-administered medications on file prior to visit.    Allergies  Allergen Reactions  . Neomycin Hives

## 2018-05-06 NOTE — Telephone Encounter (Signed)
Spoke with SN and he recommends:  Levaquin 500mg  1 daily #10. Take Align and Activia yogurt  Called pt, advised wife that we sent Rx to the pharmacy and SN instructions. She understood and will let the pt know. Nothing further is needed.

## 2018-05-07 ENCOUNTER — Other Ambulatory Visit (HOSPITAL_COMMUNITY): Payer: Self-pay | Admitting: Cardiology

## 2018-05-07 ENCOUNTER — Other Ambulatory Visit: Payer: Self-pay | Admitting: Pulmonary Disease

## 2018-05-07 DIAGNOSIS — C3411 Malignant neoplasm of upper lobe, right bronchus or lung: Secondary | ICD-10-CM

## 2018-05-21 ENCOUNTER — Telehealth: Payer: Self-pay | Admitting: Internal Medicine

## 2018-05-21 NOTE — Telephone Encounter (Signed)
MM PAL 10/14 - moved appointments to 10/21. Other appointments remain the same. Left message. Schedule mailed.

## 2018-05-23 ENCOUNTER — Telehealth: Payer: Self-pay | Admitting: *Deleted

## 2018-05-23 NOTE — Telephone Encounter (Signed)
Oncology Nurse Navigator Documentation  Oncology Nurse Navigator Flowsheets 05/23/2018  Navigator Location [facility]  Navigator Encounter Type Telephone/Paul Sandoval called and left me a vm message.  I called him back. He had questions about his upcoming scan. I clarified and he was thankful for the help  Telephone Incoming Call;Outgoing Call  Treatment Phase Follow-up  Barriers/Navigation Needs Education  Education Other  Interventions Education  Acuity Level 1  Time Spent with Patient 15

## 2018-05-27 DIAGNOSIS — J449 Chronic obstructive pulmonary disease, unspecified: Secondary | ICD-10-CM | POA: Diagnosis not present

## 2018-06-04 ENCOUNTER — Encounter (HOSPITAL_COMMUNITY): Payer: Self-pay

## 2018-06-04 ENCOUNTER — Ambulatory Visit (HOSPITAL_COMMUNITY)
Admission: RE | Admit: 2018-06-04 | Discharge: 2018-06-04 | Disposition: A | Discharge status: Home / Self Care | Payer: Medicare Other | Source: Ambulatory Visit | Attending: Internal Medicine | Admitting: Internal Medicine

## 2018-06-04 ENCOUNTER — Inpatient Hospital Stay: Payer: Medicare Other | Attending: Internal Medicine

## 2018-06-04 DIAGNOSIS — Z923 Personal history of irradiation: Secondary | ICD-10-CM | POA: Diagnosis not present

## 2018-06-04 DIAGNOSIS — I2510 Atherosclerotic heart disease of native coronary artery without angina pectoris: Secondary | ICD-10-CM | POA: Insufficient documentation

## 2018-06-04 DIAGNOSIS — C3490 Malignant neoplasm of unspecified part of unspecified bronchus or lung: Secondary | ICD-10-CM

## 2018-06-04 DIAGNOSIS — Z9049 Acquired absence of other specified parts of digestive tract: Secondary | ICD-10-CM | POA: Insufficient documentation

## 2018-06-04 DIAGNOSIS — Z9889 Other specified postprocedural states: Secondary | ICD-10-CM | POA: Diagnosis not present

## 2018-06-04 DIAGNOSIS — I700 Atherosclerosis of aorta: Secondary | ICD-10-CM | POA: Insufficient documentation

## 2018-06-04 DIAGNOSIS — R0609 Other forms of dyspnea: Secondary | ICD-10-CM | POA: Diagnosis not present

## 2018-06-04 DIAGNOSIS — Z79899 Other long term (current) drug therapy: Secondary | ICD-10-CM | POA: Diagnosis not present

## 2018-06-04 DIAGNOSIS — C3411 Malignant neoplasm of upper lobe, right bronchus or lung: Secondary | ICD-10-CM | POA: Diagnosis not present

## 2018-06-04 DIAGNOSIS — Z9221 Personal history of antineoplastic chemotherapy: Secondary | ICD-10-CM | POA: Diagnosis not present

## 2018-06-04 DIAGNOSIS — Z7982 Long term (current) use of aspirin: Secondary | ICD-10-CM | POA: Diagnosis not present

## 2018-06-04 DIAGNOSIS — Z87891 Personal history of nicotine dependence: Secondary | ICD-10-CM | POA: Insufficient documentation

## 2018-06-04 LAB — CMP (CANCER CENTER ONLY)
ALBUMIN: 3.8 g/dL (ref 3.5–5.0)
ALK PHOS: 62 U/L (ref 38–126)
ALT: 13 U/L (ref 0–44)
ANION GAP: 11 (ref 5–15)
AST: 16 U/L (ref 15–41)
BUN: 19 mg/dL (ref 8–23)
CALCIUM: 9.4 mg/dL (ref 8.9–10.3)
CO2: 28 mmol/L (ref 22–32)
CREATININE: 1.53 mg/dL — AB (ref 0.61–1.24)
Chloride: 103 mmol/L (ref 98–111)
GFR, Est AFR Am: 48 mL/min — ABNORMAL LOW (ref >60)
GFR, Estimated: 42 mL/min — ABNORMAL LOW (ref >60)
GLUCOSE: 91 mg/dL (ref 70–99)
Potassium: 4.3 mmol/L (ref 3.5–5.1)
SODIUM: 142 mmol/L (ref 135–145)
Total Bilirubin: 0.5 mg/dL (ref 0.3–1.2)
Total Protein: 7.3 g/dL (ref 6.5–8.1)

## 2018-06-04 LAB — CBC WITH DIFFERENTIAL (CANCER CENTER ONLY)
Abs Immature Granulocytes: 0.06 10*3/uL (ref 0.00–0.07)
Basophils Absolute: 0 10*3/uL (ref 0.0–0.1)
Basophils Relative: 0 %
EOS ABS: 0 10*3/uL (ref 0.0–0.5)
Eosinophils Relative: 0 %
HEMATOCRIT: 38.8 % — AB (ref 39.0–52.0)
HEMOGLOBIN: 12.3 g/dL — AB (ref 13.0–17.0)
IMMATURE GRANULOCYTES: 1 %
LYMPHS ABS: 0.7 10*3/uL (ref 0.7–4.0)
LYMPHS PCT: 8 %
MCH: 29.7 pg (ref 26.0–34.0)
MCHC: 31.7 g/dL (ref 30.0–36.0)
MCV: 93.7 fL (ref 80.0–100.0)
MONOS PCT: 9 %
Monocytes Absolute: 0.7 10*3/uL (ref 0.1–1.0)
NEUTROS PCT: 82 %
Neutro Abs: 6.6 10*3/uL (ref 1.7–7.7)
Platelet Count: 111 10*3/uL — ABNORMAL LOW (ref 150–400)
RBC: 4.14 MIL/uL — ABNORMAL LOW (ref 4.22–5.81)
RDW: 14 % (ref 11.5–15.5)
WBC Count: 8.1 10*3/uL (ref 4.0–10.5)
nRBC: 0 % (ref 0.0–0.2)

## 2018-06-06 ENCOUNTER — Other Ambulatory Visit (HOSPITAL_COMMUNITY): Payer: Self-pay | Admitting: Cardiology

## 2018-06-09 ENCOUNTER — Ambulatory Visit: Payer: Medicare Other | Admitting: Internal Medicine

## 2018-06-10 ENCOUNTER — Encounter: Payer: Self-pay | Admitting: Family Medicine

## 2018-06-10 ENCOUNTER — Ambulatory Visit (INDEPENDENT_AMBULATORY_CARE_PROVIDER_SITE_OTHER): Payer: Medicare Other | Admitting: Family Medicine

## 2018-06-10 VITALS — BP 138/62 | Ht 70.0 in | Wt 189.8 lb

## 2018-06-10 DIAGNOSIS — E7800 Pure hypercholesterolemia, unspecified: Secondary | ICD-10-CM

## 2018-06-10 DIAGNOSIS — Z0000 Encounter for general adult medical examination without abnormal findings: Secondary | ICD-10-CM | POA: Diagnosis not present

## 2018-06-10 DIAGNOSIS — J441 Chronic obstructive pulmonary disease with (acute) exacerbation: Secondary | ICD-10-CM | POA: Diagnosis not present

## 2018-06-10 DIAGNOSIS — Z23 Encounter for immunization: Secondary | ICD-10-CM | POA: Diagnosis not present

## 2018-06-10 DIAGNOSIS — N183 Chronic kidney disease, stage 3 unspecified: Secondary | ICD-10-CM

## 2018-06-10 DIAGNOSIS — C3411 Malignant neoplasm of upper lobe, right bronchus or lung: Secondary | ICD-10-CM

## 2018-06-10 DIAGNOSIS — I10 Essential (primary) hypertension: Secondary | ICD-10-CM | POA: Diagnosis not present

## 2018-06-10 MED ORDER — LEVOFLOXACIN 500 MG PO TABS: ORAL_TABLET | ORAL | 0 refills | Status: DC

## 2018-06-10 NOTE — Progress Notes (Signed)
Subjective:    Patient ID: Paul Sandoval, male    DOB: 27-Dec-1940, 77 y.o.   MRN: [id number]  HPI AWV- Annual Wellness Visit  The patient was seen for their annual wellness visit. The patient's past medical history, surgical history, and family history were reviewed. Pertinent vaccines were reviewed ( tetanus, pneumonia, shingles, flu) The patient's medication list was reviewed and updated.  The height and weight were entered.  BMI recorded in electronic record elsewhere  Cognitive screening was completed. Outcome of Mini - Cog: pass   Falls /depression screening electronically recorded within record elsewhere  Current tobacco usage:not presently  (All patients who use tobacco were given written and verbal information on quitting)  Recent listing of emergency department/hospitalizations over the past year were reviewed.  current specialist the patient sees on a regular basis: cancer dr, heart dr, lung dr.    Elvin Sandoval annual wellness visit patient questionnaire was reviewed.  A written screening schedule for the patient for the next 5-10 years was given. Appropriate discussion of followup regarding next visit was discussed.   Exercising two miles per day  Results for orders placed or performed in visit on 06/04/18  CMP ([place] only)  Result Value Ref Range   Sodium 142 135 - 145 mmol/L   Potassium 4.3 3.5 - 5.1 mmol/L   Chloride 103 98 - 111 mmol/L   CO2 28 22 - 32 mmol/L   Glucose, Bld 91 70 - 99 mg/dL   BUN 19 8 - 23 mg/dL   Creatinine 1.53 (H) 0.61 - 1.24 mg/dL   Calcium 9.4 8.9 - 10.3 mg/dL   Total Protein 7.3 6.5 - 8.1 g/dL   Albumin 3.8 3.5 - 5.0 g/dL   AST 16 15 - 41 U/L   ALT 13 0 - 44 U/L   Alkaline Phosphatase 62 38 - 126 U/L   Total Bilirubin 0.5 0.3 - 1.2 mg/dL   GFR, Est Non Af Am 42 (L) >60 mL/min   GFR, Est AFR Am 48 (L) >60 mL/min   Anion gap 11 5 - 15  CBC with Differential (Cancer Center Only)  Result Value Ref Range   WBC Count 8.1  4.0 - 10.5 K/uL   RBC 4.14 (L) 4.22 - 5.81 MIL/uL   Hemoglobin 12.3 (L) 13.0 - 17.0 g/dL   HCT 38.8 (L) 39.0 - 52.0 %   MCV 93.7 80.0 - 100.0 fL   MCH 29.7 26.0 - 34.0 pg   MCHC 31.7 30.0 - 36.0 g/dL   RDW 14.0 11.5 - 15.5 %   Platelet Count 111 (L) 150 - 400 K/uL   nRBC 0.0 0.0 - 0.2 %   Neutrophils Relative % 82 %   Neutro Abs 6.6 1.7 - 7.7 K/uL   Lymphocytes Relative 8 %   Lymphs Abs 0.7 0.7 - 4.0 K/uL   Monocytes Relative 9 %   Monocytes Absolute 0.7 0.1 - 1.0 K/uL   Eosinophils Relative 0 %   Eosinophils Absolute 0.0 0.0 - 0.5 K/uL   Basophils Relative 0 %   Basophils Absolute 0.0 0.0 - 0.1 K/uL   Immature Granulocytes 1 %   Abs Immature Granulocytes 0.06 0.00 - 0.07 K/uL   bp average  o 130 over 60   Blood pressure medicine and blood pressure levels reviewed today with patient. Compliant with blood pressure medicine. States does not miss a dose. No obvious side effects. Blood pressure generally good when checked elsewhere. Watching salt intake.   Patient continues  to take lipid medication regularly. No obvious side effects from it. Generally does not miss a dose. Prior blood work results are reviewed with patient. Patient continues to work on fat intake in diet  Patient recently treated with Levaquin for flare of COPD.  Now his productive cough is returned.  With increased shortness of breath and increased need of albuterol for wheezing  Patient also had scan of the chest over a week ago.  Talk to specialist in another week but actually wondering what the results of the scan showed   Review of Systems  Constitutional: Negative for activity change, appetite change and fever.  HENT: Negative for congestion and rhinorrhea.   Eyes: Negative for discharge.  Respiratory: Negative for cough and wheezing.   Cardiovascular: Negative for chest pain.  Gastrointestinal: Negative for abdominal pain, blood in stool and vomiting.  Genitourinary: Negative for difficulty urinating and  frequency.  Musculoskeletal: Negative for neck pain.  Skin: Negative for rash.  Allergic/Immunologic: Negative for environmental allergies and food allergies.  Neurological: Negative for weakness and headaches.  Psychiatric/Behavioral: Negative for agitation.  All other systems reviewed and are negative.      Objective:   Physical Exam  Constitutional: He appears well-developed and well-nourished.  HENT:  Head: Normocephalic and atraumatic.  Right Ear: External ear normal.  Left Ear: External ear normal.  Nose: Nose normal.  Mouth/Throat: Oropharynx is clear and moist.  Eyes: Pupils are equal, round, and reactive to light. EOM are normal.  Neck: Normal range of motion. Neck supple. No thyromegaly present.  Cardiovascular: Normal rate, regular rhythm and normal heart sounds.  No murmur heard. Pulmonary/Chest: Effort normal and breath sounds normal. No respiratory distress. He has no wheezes.  Bronchial cough during exam rare wheeze  Abdominal: Soft. Bowel sounds are normal. He exhibits no distension and no mass. There is no tenderness.  Genitourinary: Penis normal.  Musculoskeletal: Normal range of motion. He exhibits no edema.  Lymphadenopathy:    He has no cervical adenopathy.  Neurological: He is alert. He exhibits normal muscle tone.  Skin: Skin is warm and dry. No erythema.  Psychiatric: He has a normal mood and affect. His behavior is normal. Judgment normal.  Vitals reviewed.         Assessment & Plan:  Impression wellness exam.  Colonoscopy now due per dr Sydell Axon.  Patient encouraged.  Diet discussed.  Exercise discussed.  Vaccines discussed.  Up-to-date except for flu shot flu shot today  2.  Hypertension.  Good control discussed maintain same meds  3.  Hyperlipidemia.  Prior blood work reviewed.  Patient maintain meds  4 acute exacerbation of COPD discussed initiated warning signs discussed  5.  CT chest results.  Per patient request.  Advised the pulmonary  folks still need to discuss with him, but looks encouraging.  No new masses no new metastatic disease.  Old area of scarring appears stable.  Of course patient realizes with lung cancer there is always a risk in fact probability of recurrence discussed with patient  Re ck six mo

## 2018-06-16 ENCOUNTER — Ambulatory Visit: Payer: Medicare Other | Admitting: Internal Medicine

## 2018-06-19 ENCOUNTER — Inpatient Hospital Stay (HOSPITAL_BASED_OUTPATIENT_CLINIC_OR_DEPARTMENT_OTHER): Payer: Medicare Other | Admitting: Internal Medicine

## 2018-06-19 ENCOUNTER — Encounter: Payer: Self-pay | Admitting: Internal Medicine

## 2018-06-19 ENCOUNTER — Telehealth: Payer: Self-pay

## 2018-06-19 VITALS — BP 124/57 | HR 92 | Temp 97.6°F | Resp 20 | Ht 70.0 in | Wt 189.3 lb

## 2018-06-19 DIAGNOSIS — Z9049 Acquired absence of other specified parts of digestive tract: Secondary | ICD-10-CM

## 2018-06-19 DIAGNOSIS — Z9221 Personal history of antineoplastic chemotherapy: Secondary | ICD-10-CM

## 2018-06-19 DIAGNOSIS — Z79899 Other long term (current) drug therapy: Secondary | ICD-10-CM | POA: Diagnosis not present

## 2018-06-19 DIAGNOSIS — C3490 Malignant neoplasm of unspecified part of unspecified bronchus or lung: Secondary | ICD-10-CM

## 2018-06-19 DIAGNOSIS — Z7982 Long term (current) use of aspirin: Secondary | ICD-10-CM

## 2018-06-19 DIAGNOSIS — Z87891 Personal history of nicotine dependence: Secondary | ICD-10-CM

## 2018-06-19 DIAGNOSIS — C3411 Malignant neoplasm of upper lobe, right bronchus or lung: Secondary | ICD-10-CM | POA: Diagnosis not present

## 2018-06-19 DIAGNOSIS — I2510 Atherosclerotic heart disease of native coronary artery without angina pectoris: Secondary | ICD-10-CM

## 2018-06-19 DIAGNOSIS — I10 Essential (primary) hypertension: Secondary | ICD-10-CM

## 2018-06-19 DIAGNOSIS — Z923 Personal history of irradiation: Secondary | ICD-10-CM | POA: Diagnosis not present

## 2018-06-19 NOTE — Progress Notes (Signed)
[place] [phone]   [phone]  OFFICE PROGRESS NOTE  [name], MD [address] [place] [address]  DIAGNOSIS: Stage IIIA (T2b, N2, M0) non-small cell lung cancer, favoring squamous cell carcinoma presented with right upper lobe lung mass in addition to mediastinal lymphadenopathy diagnosed in March 2017.  PRIOR THERAPY:  A course of concurrent chemoradiation with weekly carboplatin for AUC of 2 and paclitaxel 45 MG/M2. Status post 5 cycle with partial response.  CURRENT THERAPY: Observation  INTERVAL HISTORY: Paul Sandoval 77 y.o. male returns to the clinic today for six-month follow-up visit.  The patient is feeling fine today with no concerning complaints except for mild cough and chest congestion.  He was treated recently for bronchitis.  He continues to have wheezing and he used inhalers at regular basis.  He denied having any chest pain but has shortness of breath with exertion with no hemoptysis.  He denied having any fever or chills.  He has no nausea, vomiting, diarrhea or constipation.  The patient had a repeat CT scan of the chest performed recently and he is here for evaluation and discussion of his discuss results.   MEDICAL HISTORY: Past Medical History:  Diagnosis Date  . AAA (abdominal aortic aneurysm) ([place]) 2010   4.4 cm 08/2008;4.44 in 7/10 and 4.65 in 08/2009; 4.8 by CT in 11/2009; 4.3 by ultrasound in 08/2010  . Anemia   . Arteriosclerotic cardiovascular disease (ASCVD) 1996   CABG-1996  . Arthritis    "fingers" (03/18/2014)  . CAD (coronary artery disease)    03/18/14:  PCI with DES to distal left main. 7/29: DES to the SVG to Diag  . Cancer ([place])    Upper right lobe lung cancer  . Cardiomyopathy, ischemic    Echo 03/17/14: EF 45-50%  . Chronic bronchitis ([place])   . Chronic kidney disease    CRF  . Chronic rhinitis   . Colonic polyp 2002   polypectomy in 2002  . COPD (chronic obstructive  pulmonary disease) ([place])   . Diverticulosis   . Dyspnea    with exertion  . ED (erectile dysfunction)   . Encounter for antineoplastic chemotherapy 12/19/2015  . GERD (gastroesophageal reflux disease)   . History of blood transfusion   . Hyperlipidemia   . Hypertension   . IFG (impaired fasting glucose)   . Myocardial infarction [facility])    "told h/o silent MI sometime before 1996"  . Pneumonia ~ 2001; ~ 2005    has had more than twice  . Right bundle branch block   . Tobacco abuse, in remission    40 pack year total consumption; discontinued in 1996    ALLERGIES:  is allergic to neomycin.  MEDICATIONS:  Current Outpatient Medications  Medication Sig Dispense Refill  . acetaminophen (TYLENOL) 500 MG tablet Take 500 mg by mouth every 6 (six) hours as needed for mild pain.    Marland Kitchen aspirin EC 81 MG tablet Take 81 mg by mouth daily before breakfast.     . budesonide-formoterol (SYMBICORT) 160-4.5 MCG/ACT inhaler Inhale 2 puffs into the lungs 2 (two) times daily.    . carvedilol (COREG) 12.5 MG tablet TAKE 1 TABLET BY MOUTH TWICE DAILY WITH A MEAL. 60 tablet 3  . clopidogrel (PLAVIX) 75 MG tablet TAKE (1) TABLET BY MOUTH ONCE DAILY. 30 tablet 5  . Ferrous Sulfate (IRON) 28 MG TABS Take 65 mg by mouth daily.     Marland Kitchen  furosemide (LASIX) 20 MG tablet Take 1 tablet (20 mg total) by mouth daily. 30 tablet 3  . GuaiFENesin (MUCUS RELIEF ADULT PO) Take 2 capsules by mouth 3 (three) times daily.    Marland Kitchen ipratropium-albuterol (DUONEB) 0.5-2.5 (3) MG/3ML SOLN INHALE 1 VIAL VIA NEBULIZER 4 TIMES DAILY. (Patient not taking: Reported on 06/10/2018) 360 mL 0  . ipratropium-albuterol (DUONEB) 0.5-2.5 (3) MG/3ML SOLN INHALE 1 VIAL VIA NEBULIZER 4 TIMES DAILY. 360 mL 3  . levofloxacin (LEVAQUIN) 500 MG tablet Take 1 tablet (500 mg total) by mouth daily. (Patient not taking: Reported on 06/10/2018) 10 tablet 0  . levofloxacin (LEVAQUIN) 500 MG tablet Take one tablet by mouth once a day for 10 days 10 tablet 0  .  methylPREDNISolone (MEDROL) 4 MG tablet Take 1 tablet (4 mg total) by mouth every other day. 50 tablet 2  . Multiple Vitamins-Minerals (CENTRUM SILVER ADULT 50+) TABS Take 1 tablet by mouth daily with lunch.     Marland Kitchen NITROSTAT 0.4 MG SL tablet PLACE 1 TAB UNDER TONGUE EVERY 5 MIN IF NEEDED FOR CHEST PAIN. MAY USE 3 TIMES.NO RELIEF CALL 911. 25 tablet 1  . omeprazole (PRILOSEC) 20 MG capsule TAKE ONE CAPSULE BY MOUTH DAILY. 90 capsule 0  . PROAIR HFA 108 (90 Base) MCG/ACT inhaler INHALE 2 PUFFS BY MOUTH EVERY 4 TO 6 HOURS AS NEEDED FOR WHEEZING. 8.5 g 0  . Probiotic Product (FLORAJEN3 PO) Take 1 tablet by mouth daily.    . sacubitril-valsartan (ENTRESTO) 49-51 MG Take 1 tablet by mouth 2 (two) times daily. 60 tablet 3  . simvastatin (ZOCOR) 80 MG tablet Take 0.5 tablets (40 mg total) by mouth at bedtime. 15 tablet 5  . spironolactone (ALDACTONE) 25 MG tablet Take 1 tablet (25 mg total) by mouth daily. 30 tablet 6   No current facility-administered medications for this visit.     SURGICAL HISTORY:  Past Surgical History:  Procedure Laterality Date  . ABDOMINAL AORTIC ANEURYSM REPAIR  11/2012  . ABDOMINAL AORTIC ENDOVASCULAR STENT GRAFT N/A 12/11/2012   Procedure: ABDOMINAL AORTIC ENDOVASCULAR STENT GRAFT;  Surgeon: Mal Misty, MD;  Location: [facility];  Service: Vascular;  Laterality: N/A;  Ultrasound guided; [place]  . CARDIAC CATHETERIZATION  01/08/1995  . COLONOSCOPY  2002   polypectomy-patient denies  . CORONARY ANGIOPLASTY WITH STENT PLACEMENT  03/18/2014   "1"  . CORONARY ANGIOPLASTY WITH STENT PLACEMENT  03/24/2014   "1"  . CORONARY ARTERY BYPASS GRAFT  01/09/1995   "CABG X3"  . FLEXIBLE BRONCHOSCOPY N/A 03/01/2017   Procedure: FLEXIBLE BRONCHOSCOPY WITH BIOPSIES;  Surgeon: Gaye Pollack, MD;  Location: [facility];  Service: Thoracic;  Laterality: N/A;  . JOINT REPLACEMENT    . LAPAROSCOPIC CHOLECYSTECTOMY  12/2009  . LEFT AND RIGHT HEART CATHETERIZATION WITH CORONARY/GRAFT ANGIOGRAM N/A 03/18/2014    Procedure: LEFT AND RIGHT HEART CATHETERIZATION WITH Beatrix Fetters;  Surgeon: Blane Ohara, MD;  Location: [facility] CATH LAB;  Service: Cardiovascular;  Laterality: N/A;  . PERCUTANEOUS CORONARY STENT INTERVENTION (PCI-S)  03/18/2014   Procedure: PERCUTANEOUS CORONARY STENT INTERVENTION (PCI-S);  Surgeon: Blane Ohara, MD;  Location: [facility] CATH LAB;  Service: Cardiovascular;;  . PERCUTANEOUS CORONARY STENT INTERVENTION (PCI-S) N/A 03/24/2014   Procedure: PERCUTANEOUS CORONARY STENT INTERVENTION (PCI-S);  Surgeon: Blane Ohara, MD;  Location: [facility] CATH LAB;  Service: Cardiovascular;  Laterality: N/A;  . PLEURAL EFFUSION DRAINAGE Right 03/01/2017   Procedure: DRAINAGE OF PLEURAL EFFUSION;  Surgeon: Gaye Pollack, MD;  Location: [facility];  Service: Thoracic;  Laterality: Right;  . RIGHT/LEFT HEART CATH AND CORONARY/GRAFT ANGIOGRAPHY N/A 05/15/2017   Procedure: RIGHT/LEFT HEART CATH AND CORONARY/GRAFT ANGIOGRAPHY;  Surgeon: Larey Dresser, MD;  Location: [facility] CV LAB;  Service: Cardiovascular;  Laterality: N/A;  . TALC PLEURODESIS Right 03/01/2017   Procedure: Pietro Cassis;  Surgeon: Gaye Pollack, MD;  Location: [facility];  Service: Thoracic;  Laterality: Right;  . TOTAL HIP ARTHROPLASTY Left 01/21/2013   Procedure: TOTAL HIP ARTHROPLASTY ANTERIOR APPROACH;  Surgeon: Mauri Pole, MD;  Location: [facility];  Service: Orthopedics;  Laterality: Left;  Marland Kitchen VIDEO ASSISTED THORACOSCOPY Right 03/01/2017   Procedure: VIDEO ASSISTED THORACOSCOPY WITH BIOPSIES;  Surgeon: Gaye Pollack, MD;  Location: [facility];  Service: Thoracic;  Laterality: Right;  Marland Kitchen VIDEO BRONCHOSCOPY N/A 11/17/2015   Procedure: VIDEO BRONCHOSCOPY WITH FLUORO;  Surgeon: Rigoberto Noel, MD;  Location: [facility];  Service: Cardiopulmonary;  Laterality: N/A;    REVIEW OF SYSTEMS:  A comprehensive review of systems was negative except for: Respiratory: positive for cough, dyspnea on exertion and wheezing   PHYSICAL EXAMINATION:  General appearance: alert, cooperative and no distress Head: Normocephalic, without obvious abnormality, atraumatic Neck: no adenopathy, no JVD, supple, symmetrical, trachea midline and thyroid not enlarged, symmetric, no tenderness/mass/nodules Lymph nodes: Cervical, supraclavicular, and axillary nodes normal. Resp: wheezes RLL Back: symmetric, no curvature. ROM normal. No CVA tenderness. Cardio: regular rate and rhythm, S1, S2 normal, no murmur, click, rub or gallop GI: soft, non-tender; bowel sounds normal; no masses,  no organomegaly Extremities: extremities normal, atraumatic, no cyanosis or edema  ECOG PERFORMANCE STATUS: 1 - Symptomatic but completely ambulatory  Blood pressure (!) 124/57, pulse 92, temperature 97.6 F (36.4 C), temperature source Oral, resp. rate 20, height _0  (1.778 m), weight 189 lb 4.8 oz (85.9 kg), SpO2 98 %.  LABORATORY DATA: Lab Results  Component Value Date   WBC 8.1 06/04/2018   HGB 12.3 (L) 06/04/2018   HCT 38.8 (L) 06/04/2018   MCV 93.7 06/04/2018   PLT 111 (L) 06/04/2018      Chemistry      Component Value Date/Time   NA 142 06/04/2018 0922   NA 137 05/21/2017 0906   K 4.3 06/04/2018 0922   K 4.1 05/21/2017 0906   CL 103 06/04/2018 0922   CO2 28 06/04/2018 0922   CO2 29 05/21/2017 0906   BUN 19 06/04/2018 0922   BUN 14.6 05/21/2017 0906   CREATININE 1.53 (H) 06/04/2018 0922   CREATININE 1.2 05/21/2017 0906      Component Value Date/Time   CALCIUM 9.4 06/04/2018 0922   CALCIUM 9.4 05/21/2017 0906   ALKPHOS 62 06/04/2018 0922   ALKPHOS 86 05/21/2017 0906   AST 16 06/04/2018 0922   AST 19 05/21/2017 0906   ALT 13 06/04/2018 0922   ALT 14 05/21/2017 0906   BILITOT 0.5 06/04/2018 0922   BILITOT 0.57 05/21/2017 0906       RADIOGRAPHIC STUDIES: Ct Chest Wo Contrast  Result Date: 06/04/2018 CLINICAL DATA:  77 year old male with history of right-sided lung cancer diagnosed in 2017 status post chemotherapy and radiation therapy  now complete. Shortness of breath on exertion. EXAM: CT CHEST WITHOUT CONTRAST TECHNIQUE: Multidetector CT imaging of the chest was performed following the standard protocol without IV contrast. COMPARISON:  Chest CT 11/21/2017. FINDINGS: Cardiovascular: Heart size is normal. There is no significant pericardial fluid, thickening or pericardial calcification. There is aortic atherosclerosis, as well as atherosclerosis of the great vessels of  the mediastinum and the coronary arteries, including calcified atherosclerotic plaque in the left main, left anterior descending, left circumflex and right coronary arteries. Status post median sternotomy for CABG including LIMA to the LAD. Calcifications of the aortic valve. Mediastinum/Nodes: No pathologically enlarged mediastinal or hilar lymph nodes. Please note that accurate exclusion of hilar adenopathy is limited on noncontrast CT scans. Small hiatal hernia. No axillary lymphadenopathy. Lungs/Pleura: Again noted is a mass-like area of architectural distortion in the superior aspect of the right lung involving portions of both the right upper lobe and superior segment of the right lower lobe, which is similar to numerous prior examinations, most compatible with an area of postradiation mass-like fibrosis. Adjacent to this posteriorly in the apex of the right hemithorax there is a small chronic loculated pleural effusion which is unchanged. There is also a small chronic loculated pleural effusion in the base of the right hemithorax with some associated pleural calcifications, also unchanged. Mild postradiation changes also noted in the anterior aspect of the left upper lobe, similar to prior examinations. No new suspicious appearing pulmonary nodules or masses. No acute consolidative airspace disease. Patchy areas of septal thickening scattered throughout both lungs, similar to prior examinations. Upper Abdomen: Aortic atherosclerosis.  Status post cholecystectomy.  Musculoskeletal: Chronic T4 compression fracture with 20% loss of anterior vertebral body height, similar to the prior examination. Median sternotomy wires. There are no aggressive appearing lytic or blastic lesions noted in the visualized portions of the skeleton. IMPRESSION: 1. Stable postradiation changes of mass-like fibrosis in the apex of the right lung. No findings to suggest local recurrence of disease or new metastatic disease in the thorax. 2. Aortic atherosclerosis, in addition to left main and 3 vessel coronary artery disease. Assessment for potential risk factor modification, dietary therapy or pharmacologic therapy may be warranted, if clinically indicated. Status post median sternotomy for CABG including LIMA to the LAD. 3. Additional incidental findings, as above, similar to prior examinations. Aortic Atherosclerosis (ICD10-I70.0). Electronically Signed   By: Vinnie Langton M.D.   On: 06/04/2018 13:33    ASSESSMENT AND PLAN:  This is a very pleasant 77 years old white male with a stage IIIa non-small cell lung cancer status post a course of concurrent chemoradiation with weekly carboplatin and paclitaxel and he had a rough time with the treatment at that time. He did not receive consolidation chemotherapy. The patient is currently on observation. He had repeat CT scan of the chest performed recently.  I personally and independently reviewed the scans and discussed the results with the patient today. His a scan showed no concerning findings for disease recurrence or progression. I recommended for the patient to continue on observation with repeat CT scan of the chest without contrast in 6 months. For the coronary artery disease, he is followed by cardiology. The patient was advised to call immediately if he has any concerning symptoms in the interval. The patient voices understanding of current disease status and treatment options and is in agreement with the current care plan. All  questions were answered. The patient knows to call the clinic with any problems, questions or concerns. We can certainly see the patient much sooner if necessary. I spent 10 minutes counseling the patient face to face. The total time spent in the appointment was 15 minutes.  Disclaimer: This note was dictated with voice recognition software. Similar sounding words can inadvertently be transcribed and may not be corrected upon review.

## 2018-06-19 NOTE — Telephone Encounter (Signed)
Printed avs and calender of upcoming appointment per 10/24 los

## 2018-06-19 NOTE — Addendum Note (Signed)
Addended by: Lucile Crater on: 06/19/2018 10:13 AM   Modules accepted: Orders

## 2018-06-27 DIAGNOSIS — J449 Chronic obstructive pulmonary disease, unspecified: Secondary | ICD-10-CM | POA: Diagnosis not present

## 2018-07-08 ENCOUNTER — Ambulatory Visit: Payer: Medicare Other | Admitting: Pulmonary Disease

## 2018-07-08 ENCOUNTER — Encounter: Payer: Self-pay | Admitting: Pulmonary Disease

## 2018-07-08 VITALS — BP 122/64 | HR 62 | Temp 97.6°F | Ht 70.0 in | Wt 185.8 lb

## 2018-07-08 DIAGNOSIS — I10 Essential (primary) hypertension: Secondary | ICD-10-CM

## 2018-07-08 DIAGNOSIS — N183 Chronic kidney disease, stage 3 unspecified: Secondary | ICD-10-CM

## 2018-07-08 DIAGNOSIS — C3411 Malignant neoplasm of upper lobe, right bronchus or lung: Secondary | ICD-10-CM

## 2018-07-08 DIAGNOSIS — I2510 Atherosclerotic heart disease of native coronary artery without angina pectoris: Secondary | ICD-10-CM

## 2018-07-08 DIAGNOSIS — E785 Hyperlipidemia, unspecified: Secondary | ICD-10-CM

## 2018-07-08 DIAGNOSIS — J90 Pleural effusion, not elsewhere classified: Secondary | ICD-10-CM

## 2018-07-08 DIAGNOSIS — I714 Abdominal aortic aneurysm, without rupture, unspecified: Secondary | ICD-10-CM

## 2018-07-08 DIAGNOSIS — J4489 Other specified chronic obstructive pulmonary disease: Secondary | ICD-10-CM

## 2018-07-08 DIAGNOSIS — J449 Chronic obstructive pulmonary disease, unspecified: Secondary | ICD-10-CM | POA: Diagnosis not present

## 2018-07-08 DIAGNOSIS — I679 Cerebrovascular disease, unspecified: Secondary | ICD-10-CM

## 2018-07-08 DIAGNOSIS — Z95828 Presence of other vascular implants and grafts: Secondary | ICD-10-CM

## 2018-07-08 DIAGNOSIS — I255 Ischemic cardiomyopathy: Secondary | ICD-10-CM

## 2018-07-08 DIAGNOSIS — I5042 Chronic combined systolic (congestive) and diastolic (congestive) heart failure: Secondary | ICD-10-CM

## 2018-07-08 DIAGNOSIS — Z951 Presence of aortocoronary bypass graft: Secondary | ICD-10-CM

## 2018-07-08 MED ORDER — AMOXICILLIN-POT CLAVULANATE 875-125 MG PO TABS: ORAL_TABLET | ORAL | 1 refills | Status: DC

## 2018-07-08 NOTE — Progress Notes (Signed)
Subjective:     Patient ID: Paul Sandoval, male   DOB: 01-16-1941, 77 y.o.   MRN: [id number]  HPI  ~  February 29, 2016:  [place] w/ SN>  Paul Sandoval is a 77 y/o WF w/ mult medical problems as noted below who has seen DrAlva & DrMannam previously;  I have reviewed his extensive epic records and recent [place] x2 in June2017 and formulated the following PROBLEM LIST>>  His PCP is DrLuking in [place]... He reminded me that I cared for his dad in 38...    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    ~  [place] in 01/[place]>  Fever, cough, increased RUL opac (PCT<0.10, he received 10d Levaquin) & right>left effusion w/ thoracentesis 02/22/16 removing 1200cc clear yellow fluid, prob transudate, & BNP was >900; meds adjusted & he was diuresed w/ improvement but 2DEcho revealed worsening CHF w/ EF ~30-35% + HK & AK => he has f/u pending w/ Cards...    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    ASPVD- s/p AAA stent graft 11/2012, mod bilat carotid occlusive dis (asymptomatic) w/ CDopplers showing ~50-60% bilat ICAstenoses>  [facility] yearly f/u CT Abd and CDopplers    MEDICAL issues>  HBP, HL, GERD/ Divertic/ Polyps, CKD-stage3, Anemia & thrombocytopenia>  On Simva80-1/2Qd, Prilosec20/d, Fe/ MVI/ etc... Since he was disch 02/25/16>  He reports feeling better, breathing better, still sl SOB w/ activ but not requiring O2, mild cough w/ beige sput, no hemoptysis, denies CP=> he is due for f/u CXR & blood work post hosp... EXAM shows Afeb, VSS, O2sat=95% on RA after walking back; Wt=180#;  HEENT- neg, mallampati2;  Chest- sl decr BS right base, few rhonchi, w/o w/r/consolidation;  Heart- RR Gr1/6SEM w/o r/g;  Abd- soft, neg;  Ext- neg w/o c/c/e...  PFT 02/01/16>  FVC=1.96 (45%), FEV1=1.25 (40%), %1sec=64, mid-flows were reduced at 28% predicted; post bronchodil there was a 31% improvement in FEV1 to 1.64;  TLC=5.96 (83%, RV=3.89 (  149%), RV/TLC=65%;  DLCO=58% pred.  This is c/w moderate to severe airflow obstruction, GOLD Stage 3 COPD w/ a signif asthmatic component, air trapping, and decr diffusion capacity...  2DEcho 02/22/16>  Mild conc LVH w/ decr LVF & EF=30-35% w/ diffuse HK & AK in several walls (see report), Gr2 DD, mild MR otherw norm valves, mild LAdil (25m), norm RV function & PA pressures  LABS 01/2016 Hosp>  Chems- ok x Cr=1.3-1.4, BS=120-140, Alb=2.8;  CBC- anemic w/ Hg=8-9 range, Plat~130K range;  TSH=0.92  CT Chest 02/21/16>  Norm heart size, extensive coronary calcif, s/p CABG, decr size of the pathological  right paratracheal LN, decr size of RUL mass (now ~2cm & prev ~4cm), new confluent airsp opac w/ air bronchograms in RUL w/ large right effusion; s/p GB, +HH, Abd Ao stent graft, DJD & DISH w/o metastatic lesions  CT Angio Chest 02/24/16>  NEG for PE, s/pCABG & stent in L[place], norm heart size, no pericard fluid, small right pleural effusion, architectural distortion & interstitial opacities in RUL c/w XRT changes, RUL nodule measures ~2cm, no adenopathy reported...   CXR 02/29/16>  Stable heart size, tortuous Ao, s/p CABG, sl improvement in interstitial opac in RUL area & posteromedial RUL nodule  LABS 02/29/16>  Chems- ok x HCO3=36, BS=120, BUN/Cr=30/1.40;  CBC- Hg=10.9, Plat=249K, WBC=18.2 (on Pred);  BNP=1053;  Sed=53 IMP/PLAN>>  Problem list as above w/ signif cardiac, pulmonary, & post chemoradiation changes;  From the pulm standpoint- he feels the BREO is helping & hasn't needed the rescue inhaler very often, continue same;  He is on a long PRED taper per DrManning for poss radiation fibrosis RUL, Sed rate is 53, continue this taper & careful w/ sweets/ sugar etc;  From the cardiac standpoint- he has signif underlying dis w/ Lmain stent, s/p CABG 20 yrs ago, prob ischemic cardiomyopathy w/ worsening 2DEcho recently w/ 30-35% EF + HK&AK seen, BNP=1053, Cards plans ischemic work up when able, in the meanwhile he has improved w/ diuresis but Chems w/ mild RI & HCO3=36... REC- add DIAMOX-ER 500 one tab daily at 4pm, continue his Lasix40 Qam, increase water intake, watch weights... We plan ROV [id number]...  ~  April 04, 2016:  50m74moV w/ SN>> Paul Sandoval the Oncology symptom management clinic 8/4 w/ intermittent cough, wheezing, fever; they did a f/u CXR interpreted as showing a right base opac suspicious for acute pneumonia but this is a soft finding on my review of the XRay, and the prev RUL reticulonod opac is improved (radiation fibrosis improved w/ Pred taper); placed on Levaquin Rx;  He denies sput  production, hemoptysis, CP, or SOB but he is fairly sedentary.    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    ~  [place]>  Fever, cough, increased RUL opac (PCT<0.10, he received 10d Levaquin) & right>left effusion w/ thoracentesis 02/22/16 removing 1200cc clear yellow fluid, prob transudate, & BNP was >900; meds adjusted & he was diuresed w/ improvement but 2DEcho revealed worsening CHF w/ EF ~30-35% + HK & AK => he has f/u pending w/ Cards...    ~  [facility]  He had right pleural effusion tapped 02/22/16=> Prob TRANSUDATE w/ TProt<3.0, LDH=95, Cytology=NEG (reactive mesothelial cells only);  Cults=> no growth    ~  03/2016 developed cough/ wheezing/ low grade fever=> CXR w/ improved RUL, ?incr markings R base, he was off the Pred, given more Levaquin...     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    ~  He saw CARDS PA 03/01/16> no change in meds, Lexiscan Myoview 03/06/16 showed hi risk study w/ EF=30%, no ST segm changes, c/w antero-apical MI & no evid reversible ischemia...    ASPVD- s/p AAA stent graft 11/2012, mod bilat carotid occlusive dis (asymptomatic) w/ CDopplers showing ~50-60% bilat ICAstenoses>  [facility] plans yearly f/u CT Abd and CDopplers    MEDICAL issues>  HBP, HL, GERD/ Divertic/ Polyps, CKD-stage3, Anemia & thrombocytopenia>  On Simva80-1/2Qd, Prilosec20/d, Fe/ MVI/ etc... EXAM shows Afeb, VSS, O2sat=99% on RA; Wt=178#;  HEENT- neg, mallampati2;  Chest- sl decr BS right base, few rhonchi, w/o w/r/consolidation;  Heart- RR Gr1/6SEM w/o r/g;  Abd- soft, neg;  Ext- neg w/o c/c/e...  Lexiscan Myoview 03/06/16 showed hi risk study w/ EF=30%, no ST segm changes, c/w antero-apical MI & no evid reversible ischemia...  CXR 03/30/16>  incr patchy opac at the right base, no effusion, RUL reticulonodular opac in RUL has regressed; norm heart size s/p CABG, abd Ao endograft part vis  LABS 03/28/16>  Chems- ok w/ K=4.0, HCO3=23, Cr=1.6, BS=145;  CBC- ok w/ Hg=11.4, wbc=6.1, plat=85K;   IMP/PLAN>>  Discussed w/ Paul Sandoval-- continue the Levaquin til gone, use Tylenol for fever or pain; continue the Breo daily & Ventolin-HFA as needed; wean off the Diamox at this time, continue Lasix40, no salt, etc; he needs to gradually increase his exercise/ mobility; we will plan ROV w/ labs in 230mo.  ~  June 06, 2016:  53m153moV w/ SN>  BilDaaielports that he is feeling better, gaining strength, walking 53mi49m mowing yard, and resting well at night;  He notes min hacking cough, no sput- no discoloration or blood, SOB is diminished & he denies CP, no f/c/s... He saw DrMohamed 05/16/16- pt remains on observation (s/p chemoradiation, 5 cycles w/ partial  response); repeat CT Chest 05/10/16 showed more confluent airsp dis in RUL w/ vol loss, abn soft tissue attenuation in right hilum has progressed (?felt to be treatment related), stable subcarinal LN ~11mm19mderate right effusion... DrMohamed felt this scan showed no concerning features of dis progression & they opted for continued observ  w/ f/u 50mo.. 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    ~  [place]>  Fever, cough, increased RUL opac (PCT<0.10, he received 10d Levaquin) & right>left effusion w/ thoracentesis 02/22/16 removing 1200cc clear yellow fluid, prob transudate, & BNP was >900; meds adjusted & he was diuresed w/ improvement but 2DEcho revealed worsening CHF w/ EF ~30-35% + HK & AK => he has f/u pending w/ Cards...    ~  [facility]  He had right pleural effusion tapped 02/22/16=> Prob TRANSUDATE w/ TProt<3.0, LDH=95, Cytology=NEG (reactive mesothelial cells only);  Cults=> no growth    ~  03/2016 developed cough/ wheezing/ low grade fever=> CXR w/ improved RUL, ?incr markings R base,  he was off the Pred, given more Levaquin...     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    ~  He saw CARDS PA 03/01/16> no change in meds, Lexiscan Myoview 03/06/16 showed hi risk study w/ EF=30%, no ST segm changes, c/w antero-apical MI & no evid reversible ischemia...    ASPVD- s/p AAA stent graft 11/2012, mod bilat carotid occlusive dis (asymptomatic) w/ CDopplers showing ~50-60% bilat ICAstenoses>  [facility]- plans yearly f/u CT Abd and CDopplers    MEDICAL issues>  HBP, HL, GERD/ Divertic/ Polyps, CKD-stage3, Anemia & thrombocytopenia>  On Simva80-1/2Qd, Prilosec20/d, Fe/ MVI etc... EXAM shows Afeb, VSS, O2sat=95% on RA; Wt=183# (up 5#);  HEENT- neg, mallampati2;  Chest- sl decr BS right base, few rhonchi, w/o w/r/consolidation;  Heart- RR Gr1/6SEM w/o r/g;  Abd- soft, neg;  Ext- neg w/o c/c/e...  CT Chest 05/10/16 showed more confluent airsp dis in RUL w/ vol loss, abn soft tissue attenuation in right hilum has progressed (?felt to be treatment related), stable subcarinal LN ~1161mmoderate right effusion.  CXR 06/06/16> progressive incr density in RUL & vol loss, soft tissue fullness in right hilum, s/p CABG and calcif in wall of Ao arch  PET scan 06/15/16> persistent but diminished hypermetabolism in the RUL area of interstitial & airsp opac-  likely related to XRT, no hypermetabolism in the prev R paratrachial LN, similar sized subcarinal LN w/ low level hypermetabolism noted; new area of LUL anterior  interstitial & airsp dis w/ some bronchiectasis shows low level hypermetabolism (?radiation change?); moderate right effusion...  IMP/PLAN>>  He is feeling better, performance status improving, CXR shows progressive changes in RUL s/p chemoradiation; CT=>PET scans w/ vol loss & persistent hypermetabolism/ evolving changes in RUL & right hilum plus he has a mod large right effusion; prev thoracentesis 01/2016 in hosp was transudative- I believe he would benefit from further eval/ repeat thoracentesis to recheck this fluid; we will set this up via IR & ask them to drain the fluid as much as poss, send it for Cytology, cell ct & diff, TProt/ LDH/ Gluc...  ~  August 06, 2016:  85moROV w/ SN>  BAldinreports that his SOB & energy have improved, appetite is better; he is more active walking 271m5d/wk + yard work/ mowing/ leaves/ etc; he notes min cough, small amt beige sput, stable DOE, no CP or edema... He tells me that DrMohamed has CT Chest & LABS ordered for next week so we will wait for these tests & review when avail...     COPD (Stage3 w/ signif revers component), former smoker (quit 1996 w/ ~40+ pack-yr hx)>  On Symbicort160-2spBid regularly ([facility] changed Rx), VentolinHFA rescue inhaler as needed...    He saw DrCherly Hensenor CARDS 07/04/16>  HBP, CAD- s/p CABG 1996 & subseq PCI to graft vessels, chr combined sys&diast CHF w/ cardiomyopathy, PVD- w/ carotid dis & Ao stent graft per drLawson, HL, CKD;  No changes made to his ASA/ Plavix, Metoprolol50-1.5Bid, Lasix40, Simva40...     EXAM shows Afeb, VSS, O2sat=98% on RA; Wt=187# (up 4#);  HEENT- neg, mallampati2;  Chest- sl decr BS right base, few rhonchi, w/o w/r/consolidation;  Heart- RR Gr1/6SEM w/o r/g;  Abd- soft, neg;  Ext- neg w/o c/c/e...  Throacentesis 06/22/16 by IR>  1.2L removed (hazy yellow fluid)=> Cell ct=923 cells w/ 2 Neutro/ 58 lymph/ 40 mono-macrophages;  Chems- LDH=119, TPro=4.1, Gluc=101;  Cyto= Atypic cells c/w reactive mesothel cells.  Post  thoracentesis CXR 06/22/16>  decr right effusion, no pneumoth, dense consolid RUL unchanged, post-op BABG...  CT Chest 08/14/16>  Norm heart size- s/p CABG & Ao atherosclerotic changes; stable upper lim adenopathy; decr right effusion- now small in size; similar interstitial & airsp dis in right perihilar & medial LUL is c/w radiation fibrosis; no signif pulm nodules or masses- no new or progressive dis...   LABS 08/14/16>  Chems- ok x Cr=1.6;  CBC- ok w/ Hg=12.7...Marland KitchenMarland KitchenMP/PLAN>>  BiIzaihas clinically stable, no clear evid of recurrent dis & followed very closely by DrMohamed; Pulm stable on Symbicort Bid, Albut rescue prn, and his exercise- continue same & we plan rov recheck in 28m40mo  ~  November 05, 2016:  28mo38mo & Paul Sandoval indicates "stronger, better" on diet & exercise program w/ good energy (eg- climbed steps at a ball game);  He denies much cough (clear throat), small amt clear sput, no blood, SOB w/ exertion only- ADLs ok & DOE stable, no CP & he describes 2mi 25mk 5d/wk & he stays busy...  [facility] (sl incr subcarinal LN, scarring & radiation fibrosis, ?sl incr right effusion)... 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    ~  [place]  Fever, cough, increased RUL opac (PCT<0.10, he received 10d Levaquin) & right>left effusion w/ thoracentesis 02/22/16 removing 1200cc clear yellow fluid, prob transudate, & BNP was >900; meds adjusted & he was diuresed w/ improvement but 2DEcho revealed worsening CHF w/ EF ~30-35% + HK & AK => he has f/u pending w/ Cards...    ~  [facility]  He had right pleural effusion tapped 02/22/16=> Prob TRANSUDATE w/ TProt<3.0, LDH=95, Cytology=NEG (reactive mesothelial cells only);  Cults=> no growth    ~  03/2016 developed cough/ wheezing/ low grade fever=> CXR w/ improved RUL, ?incr markings R base, he was off the Pred, given more Levaquin...     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    ~  He saw CARDS PA 03/01/16> no change in meds, Lexiscan Myoview 03/06/16 showed hi risk study w/ EF=30%, no ST segm changes, c/w antero-apical MI & no evid reversible ischemia...    ASPVD- s/p AAA stent graft 11/2012, mod bilat carotid occlusive dis (asymptomatic) w/ CDopplers showing ~50-60% bilat ICAstenoses>  [facility]- plans yearly f/u CT Abd and CDopplers    MEDICAL issues>  HBP, HL, GERD/ Divertic/ Polyps, CKD-stage3 (cr=1.6), Anemia (Hg=11.9) & thrombocytopenia (plat=171K)>  On Simva80-1/2Qd, Prilosec20/d, Fe/ MVI etc... EXAM shows Afeb, VSS, O2sat=98% on RA; Wt=189# (up 2#);  HEENT- neg,  mallampati2;  Chest- sl decr BS right base, few rhonchi, w/o w/r/consolidation;  Heart- RR Gr1/6SEM w/o r/g;  Abd- soft, neg;  Ext- neg w/o c/c/e...  CT Chest 10/30/16>  Norm heart size, s/p CABG, atherosclerosis in Ao; prev 154msubcarinal LN now measures 1376msl incr in right pleural effusion, radiation fibrosis & changes in medial right lung and ant LULare stable/ unchanged;   LABS 10/29/16>  Chems- wnl w/ Cr=1.6, LFTs= wnl;  CBC- ok w/ Hg=11.9, WBC=10.1  Ambulatory Oximetry 11/05/16>  O2sat=96% on RA at rest w/ pulse=81/min;  He ambulated 3 laps in office (185'each) w/ lowest O2sat=90% w/ pulse 99/min... IMP/PLAN>>  BilLarance doing satis on Symbicort160-2spBid & Albut rescue inhaler prn; he states that he is active, energy is good, & that he's getting stronger &  better (good performance status); I suspect that he has residual cancer in the right chest but it appears slowly progressive (w/ sl incr right effusion & subcarinal LN) and DrMohamed to review status & decide regarding additional therapy at this point vs continued observation... we plan rov recheck in 3-641mo  NOTE:  >50% of this 25 min rov was spent in counseling & coordination of care...  ~  Jan 02, 2017:  269moOV & Paul Sandoval called 4/24 c/o cough, yellow sput, low grade fever after having been Rx x2 by PCP w/ Doxy (note- Flu-B was pos in resp viral panel); we placed him on Levaquin & Medrol dosepak & he is some better- still congested and phlegm is beige but feeling better overall; we discussed addition of Mucinex 1200bid + fluids vs Guaifenesin 400-2Tid to save $$...     COPD> on Symbicort160-2spBid and Ventolin-HFA prn    Lung cancer- followed by DrMohamed & DrManning> RUL non-small cell ca IIIA, dx 3/17 & rx w/ Chemoradiation; subseq RUL opac (infection vs XRT-fibrosis & right effusion;  He saw DrMohamed 11/15/16- note reviewed & he was doing well at that time, playing golf & feeling good; CT Chest 10/30/16 w/ sl incr right effusion, sl incr  subcarinal LN from 10=>1362mradiation fibrosis areas stable; DrMohamed felt there was no clear sign of dis progression & rec f/u scans in 37mo837moe 01/2017)...    Cardiac- CAD, s/p CABG, Cardiomyopathy, CHF> on ASA/ Plavix, Metop25Bid, Lasix40; he saw DrNiCherly Hensen7/18- note reviewed; EF=30%, he mentioned more aggressive cardiac w/u & rx if cancer progrosis favorable (no changes made, f/u 34mo 9moDEcho)...    ASPVD- s/p AAA stent graft, bilat carotid dis> on ASA/ Plavix    Medical issues>  HBP, HL, GERD/ Divertic/ Polyps, CKD-stage3 (cr=1.6), Anemia (Hg=11.9) & thrombocytopenia (plat=171K)> EXAM shows Afeb, VSS, O2sat=98% on RA; Wt=183# (down 6#);  HEENT- neg, mallampati2;  Chest- sl decr BS right base, few rhonchi, w/o w/r/consolidation;  Heart- RR Gr1/6SEM w/o r/g;  Abd- soft, neg;  Ext- neg w/o c/c/e... IMP/PLAN>>  BillyDarrillmproved but it's been a slow process; he is at high risk for cancer progression & followed closely by DrMohamed who plans f/u CT & recheck in June;  Continue current meds, add Guaifenesin 2400mg/73mwater to aid in mucus production... We will recheck in 6mo & 41moew his f/u scans planned by Oncology.  ~  February 04, 2017:  41mo ROV69modd-on appt requested for cough, thick yellow sput, chest congestion w/ wheezing>  Notes low grade temp in the afternoon w/o c/s;  Denies CP/ palpit/ hemoptysis/ change in SOB/DOE; He is taking SYMBICORT160-2spBid, GFN 400-2Tid + fluids, plus rescue inhaler => we gave him a NEB Rx in office (min relief), sent sput for C&S (grew abundant NTF only); we discussed need for f/u CXR (CT due soon from Mohamed)Yolox w/ Levaquin/ Pred taper...    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    ~  HSalemx2 in 01/2016>  Fever, cough, increased RUL opac (PCT<0.10, he received 10d Levaquin) & right>left effusion w/ thoracentesis 02/22/16 removing 1200cc clear yellow fluid, prob transudate, & BNP was >900; meds adjusted & he was diuresed w/ improvement but 2DEcho revealed worsening CHF w/ EF ~30-35% + HK & AK => he has f/u pending w/ Cards...    ~  [facility]  He had right pleural effusion tapped 02/22/16=> Prob TRANSUDATE w/ TProt<3.0, LDH=95, Cytology=NEG (reactive mesothelial cells only);  Cults=> no growth    ~  03/2016 developed cough/ wheezing/ low grade fever=> CXR w/ improved RUL, ?incr markings R base, he was off the Pred, given more Levaquin...     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    ~  He saw CARDS PA 03/01/16> no change in meds, Lexiscan Myoview 03/06/16 showed hi risk study w/ EF=30%, no ST segm changes, c/w antero-apical MI & no evid reversible ischemia...    ASPVD- s/p AAA stent  graft 11/2012, mod bilat carotid occlusive dis (asymptomatic) w/ CDopplers showing ~50-60% bilat ICAstenoses>  [facility] plans yearly f/u CT Abd and CDopplers    MEDICAL issues>  HBP, HL, GERD/ Divertic/ Polyps, CKD-stage3 (cr=1.6), Anemia (Hg=11.9) & thrombocytopenia (plat=171K)>  On Simva80-1/2Qd, Prilosec20/d, Fe/ MVI etc... EXAM shows Afeb, VSS, O2sat=97% on RA; Wt=179# (down 4#);  HEENT- neg, mallampati2;  Chest- sl decr BS right base, few rhonchi, w/o w/r/consolidation;  Heart- RR Gr1/6SEM w/o r/g;  Abd- soft, neg;  Ext- neg w/o c/c/e...  CXR 02/04/17 (independently reviewed by me in the PACS system)> ?loculated mod right pleural effusion, RUL airsp dis is unchanged & c/w post XRT changes; left lung is clear, stable cardiac silhouette & prior CABG  Sputum C&S> abundant NTF, no pathogen identified... IMP/PLAN>>  We discussed the need for another thoracentesis=> check cytology;  We will Rx w/ empiric LEVAQUIN & PREDNISONE taper, plus his GFN at max doses w/ fluids;  Also awaiting f/u CT Chest due soon...  ADDENDUM>> Thoracentesis by IR 02/11/17>  280cc of dark bloody fluid removed, due to loculation no additional fluid was obtainable;  Fluid= exudative. & Cytology= NEGATIVE... ADDENDUM>> f/u CT Chest done 02/13/17>  Norm heart size, aortic atherosclerosis, prev CABG; mod loculated right pleural effusion; mass-like architectural distortion in RUL c/w XRT, new area of airsp consolidation in RML~3cm, mult faint nodular densities scattered on the right lung, borderline subcarinal node; Abd & musculoskeletal are ok... ADDENDUM>>  02/15/17>  Pt indicates that he is ?no better, c/o persistent cough, congested, thick yellow sput, no hemoptysis, low grade temps 99-100 range, denies much CP (just sore from coughing) and SOB/DOE about the same;  We discussed his limited options here & have suggested the following--    1) keep PRED 84m/d for now;  Add empiric AUGMENTIN 879mone tab bid x10d  (plus Align/Activia);  Add NEBULIZER w/ ALBUTEROL TID regularly and space out the Symbicort Rx still at 2sp Bid...Marland KitchenMarland Kitchen  2) check 2DEcho to compare EF to 7/17 (30-35%)    3) refer to TCTS for the chr right pleural effusion to consider poss chest tube vs VATS vs decortication surg (DrBartle did his CABG yrs ago)  ~  July 18.2018:  44m66moV & post hosp visit>  BilChristians undergone an extensive work up over the last month or so w/ thoracentesis, CT Chest, TSurg consultation followed by VATS drainage & pleurosesis, and f/u 2DEcho showing worsening LVF=> referred to CHF clinic & there recommendations are pending...    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) presenting w/ RUL mass w/ mediastinal adenopathy 3/17;  He received chemoradiation w/ wkly carboplatin & paclitaxel (5 cycles w/ part response) + XRT from DrManning finished 01/26/16;  Follow up CT Chest 02/13/17 reviewed (see above), & DrMohamed felt there was no recurrence or dis progression but +for inflamm changes in RLL & loc pleural effusion; rec to re-scan in 77mo25mo We set him up to see DrBartle 02/25/17> he reiewed prev eval, scan, thoracenteses, etc; he agreed w/ performing a right VATS to drain the resid effusion 7 examine the pleural space for tumor/ infection etc followed by pleurodesis...    He was ADM 7/6 - 03/08/17 by DrBartle w/ right VATS w/ drainage of pleural effusion & Bx of pleural surface + talc pleurodesis=> all pathology was benign (fibrous tissue & chr inflamm; Disch on similar med regimen + [phone] stable since surg- +SOB/DOE w/ stairs etc, reports eating well, no CP & hasn't required pain meds; denies f/c/s, notes min cough & sm amt whitish sput- no color, no blood... Finishing Levaquin, off Pred, on NEB w/ Albut Tid, Symbicort160-2spBid, GFN400-2Tid + fluids...    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 EXAM shows Afeb, VSS, O2sat=99% on RA; Wt=174# (down 5#);  HEENT- neg, mallampati2;  Chest- sl decr BS right base, few rhonchi, w/o w/r/consolidation;  Heart- RR Gr1/6SEM w/o r/g;  Abd- soft, neg;  Ext- neg w/o c/c/e...  2DEcho done 02/26/17 showed mod dil LV & severely reduced LVF w/ EF down to 20-25%, Gr1DD, mod calcif AoV leaflets, MV was OK, RV mod dilated & sys function mildly reduced, no PAsys reported...Marland KitchenMarland Kitchen  IMP/PLAN>>  The worsening LVF w/ EF 29-51% is certainly playing a roll in his unrespolved dyspnea=> refer to CHF clinic for active management; in the interim continue NEBs, Symbicort, Mucinex, etc...   ~  April 24, 2017:  6wk [place] had CHF clinic eval by [facility] on 04/22/17-- note reviewed, Hx CAD w/ CABG in 1996 & PCI in 2015, ischemic cardiomyopathy, and c/o incr SOB & recent 2DEcho (02/2017) showed EF=20-25%;  DrMcLean adjusted his meds by changing Metoprolol to Coreg & adding Entresto, ultimately he would like to add Spironolactone; they discussed R+L heart cath soon (sched 9/14)...      [place] has had f/u appts w/ PCP-DrLuking on 03/14/17 (transitional care follow up) and TCTS-DrBartle on 03/20/17>  Notes reviewed, his breathing is somewhat improved, on Levaquin for Serratia, DrBartle noted>>  Most of the right lung is firmly adherent to the chest wall from his prior XRT. There is a loculated fluid collection at the apex of the chest that is sealed in by the lung and could not be drained. There was a large collection in the  lower pleural space that was drained and talc pleurodesis performed. Hopefully this space will resolve over time with the talc and not refill with fluid. There is no evidence of malignancy so it is possible that this is related to heart  failure given his EF of 20-25%. He reports that he has started back to walking & slowly building up, even played 2 nine-hole rounds of golf when it wasn't too humid; he has a resid cough but decr sput & easier to produce, he requests a handicap placard... See prob list above... EXAM shows Afeb, VSS, O2sat=96% on RA; Wt=178# (up 4#);  HEENT- neg, mallampati2;  Chest- sl decr BS right base, few rhonchi, w/o w/r/consolidation;  Heart- RR Gr1/6SEM w/o r/g;  Abd- soft, neg;  Ext- neg w/o c/c/e...  CXR 04/24/18 (independently reviewed by me in the PACS system) shows left lung clear, right sided vol loss, small A/F levels at the base, pleural fluid vs thickening at right base, stable soft tissue fullness at right apex, s/p CABG etc. IMP/PLAN>>  [place] appears stable post T-surg & improved w/ CHF-clinic Rx per DrMcLean;  OK 2018 FLU shot today, and continue pulm rx w/ Albut NEBS Tid, Symbicort160-2spBid, GFN400-2Bid;  Call for any questions and we plan f/u visit in 95mo..  ~  June 24, 2017:  235moOV & Paul Sandoval presents w/ a 2d hx incr cough, yellow sput, & chest congestion- denies f/c/s;  He's been using NEBs Qid (incr per DrLuking), plus his Symbicort160-2spBid & GFN400-2Tid;  He had a ZPak called in recently;  We discussed BacterimDS Bid x10d for  this COPD exac + Pred20- 5d slow tapering protocol (see AVS) w/ rov recheck 6wks...     He saw TCTS-DrBartle 05/22/17>  S/p bronch & right VATS to drain the loculated right effusion & talc pleurodesis 03/01/17;  At surg he noted most of the right lung was firmly adherent to the chest wall from his prior XRT. There was a loculated fluid collection at the apex of the chest that was sealed in by the lung and could not be drained. There was a large collection in the lower pleural space that was drained and talc pleurodesis performed. Unfortunately the right lower lobe was partially collapsed and chronically scarred and would not expand to fill in the space formed after draining the effusion => they signed off...    He saw [facility]-DrCain 05/24/17>  Hx AAA- s/p endovasc stent graft repair, & hx Carotid art dis being followed;  Abd Ao Duplex was stable w/ 3.3 x 3.4cm aneurysm w/o elev velocities;  CDuplex w/ 40-59% RICA stenosis & 3-29% LICA and they plan recheck 55yr..    He saw CARDS-DrMcLean 05/27/17>  CAD, s/p CABG 1996, subseq PCIs, RBBB, ischemic cardiomyopathy/ CHF w/ 2DEcho showing EF=20-25% in 7/18;  Also has Hx AAA w/ prec stent graft repair;  He had CATH 04/2017 showing occluded SVG-small D, patent LIMA-LAD and SVG-PDA, 50% shelf-like stenosis distal left main (no intervention), filling pressure not elevated and cardiac output preserved.  On Coreg & Entresto, they added spironolactone & f/u 249mo   He saw ONC-DrMohamed 05/28/17>  DIAGNOSIS: Stage IIIA (T2b, N2, M0) non-small cell lung cancer, favoring squamous cell carcinoma presented with right upper lobe lung mass in addition to mediastinal lymphadenopathy diagnosed in March 2017.  PRIOR THERAPY:  A course of concurrent chemoradiation with weekly carboplatin for AUC of 2 and paclitaxel 45 MG/M2. Status post 5 cycle with partial response.  CURRENT THERAPY: Observation.  He had f/u CXR & CT Chest (see below)- no evid of dis progression/ recurrence & f/u planned  53m39mo    He saw PCP-DrLuking 06/06/17>  Annual wellness visit, note reviewed, same meds & they discussed compliance issues... EXAM shows Afeb, VSS, O2sat=98% on RA; Wt=178# (stable);  HEENT- neg, mallampati2;  Chest- sl decr BS right base, few rhonchi, w/o w/r/consolidation;  Heart- RR Gr1/6SEM w/o r/g;  Abd- soft, neg;  Ext- neg w/o c/c/e...  CT Chest 05/21/17>  See report -- IMPRESSION:  1) Similar radiation fibrosis within the paramediastinal right lung.  2) Increase in ill-defined micro nodularity bilaterally. Favor infection, including atypical etiologies.  3) Interval T4 compression deformity, favored to be posttraumatic. No metastasis identified in this region on the prior CT.  4) Aortic Atherosclerosis.  5) Loculated right-sided pleural fluid and inferior right hydropneumothorax, similar.  6) Tiny hiatal hernia. Gastric underdistention with possible gastric wall thickening suggestive of gastritis.  7) Similar borderline subcarinal adenopathy.  8)  Emphysema.  CXR 05/22/17>  Norm heart size w/ prev CABG & ao calcif, stable vol loss on the right & soft tissue fullness in right hilum, w/ consolid right apex, mod right effusion w/ loculated air/fluid IMP/PLAN>>  As above- we discussed treating THIS COPD exac w/ BacterimDS & PRED taper; he is to continue w/ his NEBS, Symbicort, GFN and we plan recheck in 6wks time...   ~  August 08, 2017:  6wk ROV & it is unclear if BilAkhiler really cleared up after the last OV- he'd been treated w/ ZPak, then BacterimDS w/ Pred taper but returns today stating "not good, I need a little help, getting weaker"; he brought in a sputum sample w/ thick beige sput=> cult grew moraxella cataralis & we discussed treatment this go-round w/ AUGMENTIN + Depo80 & a prolonged slow tper of Medrol8mg38mbs...     He had one interim f/u visit 08/02/17 w/ CARDS-DrMcLean> CAD, s/p CABG 1996, subseq PCIs, RBBB, ischemic cardiomyopathy/ CHF w/ 2DEcho showing EF=20-25% in 7/18;  Note  reviewed- they titrated up his Coreg to 9.375Bid, Entresto to 24/26Bid, & Spironolactone to 25mg64m. We reviewed the following medical problems during today's office visit>     COPD (Stage3 w/ signif revers component), former smoker (quit 1996 w/ ~40+ pack-yr hx)>  On NEBs w/ Albut Qid, Symbicort160-2spBid regularly, VentolinHFA rescue inhaler & GFN as needed; with persistent symptoms we discussed changing Albut  to DUONEB Qid, Symbicort160-2spBid, incr GFN400 to 2tabsTid + fluids & to work hard expectorating the mucus...    Freq COPD exacerbations & sput C&S growing Serratia 7/18 (resist to cephalosporins), and MCat 12/18 (wasn't better after ZPak & BacterimDS, therefore switched to Augmentin rx)... Now w/ MCat in sput & no better after ZPak & BacterimDS + Pred- we are treating w/ Augmentin x10d & long slow Medrol taper...     Non-small cell lung cancer, Stage IIIA, diagnosed 10/2015 & treated by DrMohamed & DrManning w/ Chemoradiation (carboplatin & paclitaxel)     ~  Bronch 11/17/15 by Kallie Edward showed no endobronchial lesions- TBBx were neg, but brushing & washings were pos for malig cells (felt to be c/w SqCellCa)    ~  He received ChemoRx from DrMohamed with weekly carboplatin and paclitaxel status post 4 cycles finished 01/24/16 (dose held on several occas due to side effects)    ~  He received XRT 4/17 - 01/26/16 by DrManning to the primary tumor & involved mediastinal adenopathy w/ 66 Gy (33 fractions of 2Gy each); he had mod esophagitis & some fatigue, plus post radiation scarring in the lung...    ~  DrMohamed follows w/ serial CT Chest scans every 72mo=> now every 66mo/ f/u due 11/2017...    Abnormal CXR w/ RUL mass, s/p treatment as above, & subseq RUL opac (?infection vs radiation fibrosis changes), right pleural effusion>      ~  Treated w/ antibiotic Rx & PREDNISONE taper (30-25-20-15-10-5 Q5d over 47m59moer DrManning...    ~  [facility] in 01/2016>  Fever, cough, increased RUL opac (PCT<0.10, he  received 10d Levaquin) & right>left effusion w/ thoracentesis 02/22/16 removing 1200cc clear yellow fluid, prob transudate, & BNP was >900; meds adjusted & he was diuresed w/ improvement but 2DEcho revealed worsening CHF w/ EF ~30-35% + HK & AK => Cards f/u.    ~  [facility]  He had right pleural effusion tapped 02/22/16=> Prob TRANSUDATE w/ TProt<3.0, LDH=95, Cytology=NEG (reactive mesothelial cells only);  Cults=> no growth    ~  Progressive changes in right chest and recurrent symptoms over 2017-2018 w/ interval thoracentesis etc-- all led up to right VATS surg & talc pleurodesis 02/2017 by DrBartle (all path was benign)...    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    ~  He saw CARDS PA 03/01/16> no change in meds, Lexiscan Myoview 03/06/16 showed hi risk study w/ EF=30%, no ST segm changes, c/w antero-apical MI & no evid reversible ischemia...    ~  Referred to DrMcLean's CHF clinic 7/18 w/ change in meds and titration of Coreg, Entresto, Spironolactone => on-going f/u & titration of meds...    ASPVD- s/p AAA stent graft 11/2012, mod bilat carotid occlusive dis (asymptomatic) w/ CDopplers showing ~50-60% bilat ICAstenoses>  [facility]- plans yearly f/u CT Abd and CDopplers    MEDICAL issues>  HBP, HL, GERD/ Divertic/ Polyps, CKD-stage3 (cr=1.6), Anemia (Hg=11.9) & thrombocytopenia (plat=171K)>  On Simva80-1/2Qd, Prilosec20/d, Fe/ MVI etc... EXAM shows Afeb, VSS, O2sat=95% on RA; Wt=183# (stable);  HEENT- neg, mallampati2;  Chest- sl decr BS right base, few rhonchi, w/o  w/r/consolidation;  Heart- RR Gr1/6SEM w/o r/g;  Abd- soft, neg;  Ext- neg w/o c/c/e...  LABS 08/02/17>  Chems- ok w/ Cr=1.43,  K=4.0 IMP/PLAN>>  As noted above Paul Sandoval is having a rough time lately>  He had the right VATS surg & talc pleurodesis 02/2017;  He has a severe ischemic cardiomyopathy w/ EF= 20-25% & now followed closely by DrMcLean w/ freq (Q58mo visits in the CHF clinic for medication titration;  He has severe obstructive lung dis w/ FEV1=1.25 (40%) in 01/2016 and freq exacerbations as documented above;  We reviewed recommendations for NEBS w/ DUONEB Qid, Symbicort160-2spBid, GFN400-2tabs Tid w/ fluids;  We are starting a 10d course of AUGMENTIN875Bid and a long slow MEDROL taper w/ planned recheck in 6wks;  He will continue to work on chest physiotherapy & bronchial mucus hygiene...  ~  September 19, 2017:  6wk ROV & pulmonary follow up> BRush Landmarkis stable on his medication regimen as outlined above; he reports another URI w/ yellow/ green mucus & treated by his PCP (DrLuking) w/ [employer] he is still on the Medrol 875m 1/2 tab Qod; overall he feels that his SOB/ breathing is much improved;  He remains on DUONEB Qid, Symbicort160-2spBid, GFN400-2Tid w/ fluids, & currently on Medrol4m36md=> rec to stay on this...     COPD- GOLD Stage3 w/ revers component, & freq infectious exac> rec to be diligent w/ NEB treatments, Symbicort, GFN, etc...     Hx non-small cell lung cancer, stage IIIA- dx 10/2015 & treated by DrMohamed & DrManning w/ chemoradiation; he is due for 27mo31mo w/ oncology 11/2017...    Abn CXR- s/p treatment as noted, subseq RUL opac (?infection vs radiation fibrosis?) & right effusion...    CAD, s/pCABG1996, RBBB, ischemic cardiomyopathy w/ acute on chr sys & diast CHF> followed by DrMcLean's CHF clinic...    ASPVD- s/p AAA stent graft 2014, mod bilat carotid dis...    Medical issues> HBP, HL, GERD/ divertics/ polyps, CKD-stage3 (Cr~1.6), anemia, etc...  EXAM shows Afeb, VSS, O2sat=95% on RA; Wt=180# (stable);  HEENT- neg, mallampati2;  Chest- sl decr BS right base, few rhonchi, w/o w/r/consolidation;  Heart- RR Gr1/6SEM  w/o r/g;  Abd- soft, neg;  Ext- neg w/o c/c/e...  LABS 09/19/17>  FLP- all parameters at goals on diet alone;  Chems are ok on current meds & Cr stable at 1.54;  CBC- sl improved w/ Hg=12.3, WBC-9.4;  TSH=2.66;  BNP=413;  Sed=51... REC to continue Lasix40, & no salt diet + continue MEDROL 4mg 81m...  IMP/PLAN>>  Paul Sandoval required mult antibiotics to get over his recent upper resp infection=> ZPak, then Bacterim, then Augmentin, then [facility]4mgQ[facility]14m is asked to stay on this; he requested follow up labs (as above); reminded to stay as active as poss w/ a regular exercise program...   ~  December 19, 2017:  87mo RO51moBilly rHymens feeling well- denies problems, walking 45min d3m & doing yard work!  He is much improved overall;  About 6wks ago he called w/ incr cough & mucus production, we called in Levaquin & he improved- now w/ min cough, sm amt whitish sput, decr SOB, stable DOE, no CP, & resting better... He remains on MEDROL 4mgQod, 67mS w/ Duoneb TID regularly, SYMBICORT160-2spBid, GFN400-2Tid w/ fluids, plus his cardiac meds... We reviewed the following interval medical notes since his last visit Jan2019>      He saw CARDS- DrMcLean on  10/08/17>  CAD, s/p CABG in 1996, w/ DES to  [place] stenosis & 2 PCIs in 2015, ischemic cardiomyopathy/ CHF w/ EF down to 20-25% in 02/2017; meds adjusted w/ Entresto incr to 49/51Bid, decr Lasix20Qam, continue Aldactone25    He saw ONCOLOGY- DrMohamed on 12/03/17>  Hx RUL mass (stage IIIA non-small cell lung cancer- favoring squamous cell dis- treated w/ chemoradiation, 5 cycles w/ partial response, currently on observation;  Note reviewed, good performance status, f/u CT Chest 11/21/17 (see below) no findings to suggest new or progressive dis, REC to continue observation w/ f/u CT Chest & OV in 32mo..     He saw PCP- DrLuking 12/06/17>  HBP, HL, followed by Cards & Pulm;  Stable, same meds, asked to f/u 618mo. We summarized his on-going cardio-pulm issues>       COPD- GOLD Stage3 w/ revers component, & freq infectious exac> rec to be diligent w/ NEB treatments, Symbicort, GFN, etc...     Hx non-small cell lung cancer, stage IIIA- dx 10/2015 & treated by DrMohamed & DrManning w/ chemoradiation; he is due for 62m20moV w/ oncology 11/2017...    Abn CXR- s/p treatment as noted, subseq RUL opac (?infection vs radiation fibrosis?) & right effusion=> VATS w/ talc pleurodesis (no malig cells)...    CAD, s/pCABG1996, RBBB, ischemic cardiomyopathy w/ acute on chr sys & diast CHF> followed by DrMcLean's CHF clinic...    ASPVD- s/p AAA stent graft 2014, mod bilat carotid dis...    Medical issues> HBP, HL, GERD/ divertics/ polyps, CKD-stage3 (Cr~1.6), anemia, etc...  EXAM shows Afeb, VSS, O2sat=100% on RA; Wt=187#;  HEENT- neg, mallampati2;  Chest- sl decr BS right base, few rhonchi, w/o w/r/consolidation;  Heart- RR Gr1/6SEM w/o r/g;  Abd- soft, neg;  Ext- neg w/o c/c/e...  CT Chest 11/21/17>  IMPRESSION:    1. Similar appearance of changes due to external beam radiation within the right lung and medial left upper lobe. No findings to suggest new or progressive disease.    2. Stable appearance of loculated right-sided pleural effusion status post talc pleurodesis.    3. Stable borderline subcarinal adenopathy.    4. Stable T4 compression fracture.  LABS 11/26/17 in Epic>  Chems- ok x Cr=1.52 (stable);  CBC- ok x Hg=11.8 IMP/PLAN>>  BilAfton remarkably stable w/ his severe pulmonary & cardiac diseases;  We reviewed his medication regimen & rec to continue same;  We reviewed diet & exercise prescriptions;  We plan rov recheck 20mo2moCXR...  ~  April 03, 2018:  20mo 95mo& Paul Sandoval Paul Landmarkins stoic & doing well he says; he continues to exercise regularly by walking indoors 45-50 min 5d per week + yard work (mowing, weed eating, schrubs);  Even played 9 holes of golf;  He continues to be followed by CARDS- DrMcLean [facility] last done 02/2018 & [place]  We reviewed the following interval medical notes since his last visit Jan2019>      He saw CARDS- DrMcLean on  01/06/18>  CAD, s/p CABG in 1996, w/ DES to [place]Friendshiposis & 2 PCIs in 2015, ischemic cardiomyopathy/ CHF w/ EF down to 20-25% in 02/2017; RBBB, HL;  note reviewed-- meds adjusted w/ Entresto decr back to 24/26Bid due to orthostatic & low BP, incr Coreg to 12.5Bid, cont Lasix20Qam, cont Aldactone25... We summarized his on-going cardio-pulm issues>      COPD- GOLD Stage3 w/ revers component, & freq infectious exac> rec to be diligent w/ NEB treatments, Symbicort, GFN,  etc...     Hx non-small cell lung cancer, stage IIIA- dx 10/2015 & treated by DrMohamed & DrManning w/ chemoradiation; last f/u CT Chest from [facility] 10/2017 w/o signs of recurrence or progressive dis; s/p XRT & talc pleurodesis...    Abn CXR- s/p treatment as noted, subseq RUL opac (?infection vs radiation fibrosis?) & right effusion=> VATS w/ talc pleurodesis (no malig cells) 02/2017 by DrBartle...    CAD, s/pCABG1996, RBBB, ischemic cardiomyopathy w/ acute on chr sys & diast CHF> followed by DrMcLean's CHF clinic...    ASPVD- s/p AAA stent graft 2014, mod bilat carotid dis...    Medical issues> HBP, HL, GERD/ divertics/ polyps, CKD-stage3 (Cr~1.6), anemia, etc...  EXAM shows Afeb, VSS, O2sat=99% on RA; Wt=188#;  HEENT- neg, mallampati2;  Chest- sl decr BS right base, few rhonchi, w/o w/r/consolidation;  Heart- RR Gr1/6SEM w/o r/g;  Abd- soft, neg;  Ext- neg w/o c/c/e...  CXR 04/03/18 (independently reviewed by me in the PACS system) showed norm heart size, aortic atherosclerotic calcif, vol loss on right w/ mediastinal shift, chr opac RUL/ right perihilar area/ right basedue to radiation fibrosis/ scarring, left lung is clear -- chr post-therapy changes w/ right effusion/ atelectasis/ scarring...   LABS from the [place] 02/2018>  FLP- at goals w/ TChol=143, TG=134, HDL=41, LDL=75;  Chems- ok w/ BS=92, A1c=5.7,  Cr=1.51, LFTs- wnl;  CBC- ok w/ Hg=12.2, WBC=8.2;  TSH=2.49 IMP/PLAN>>  Paul Sandoval is rec to continue his same meds- Medrol87mQod, Duoneb Qid, Symbicort160-2spBid, GFN 400-2tabsTid;  Continue meds and f/u w/ drMcLean (he has f/u 2DEcho soon);  We will continue 349monterval visits & asked to call prn any changes in his status...   ~  July 08, 2018:  77m69moV & pulmonary follow up visit>         Past Medical History:  Diagnosis Date  . AAA (abdominal aortic aneurysm) (HCCChurchill010   4.4 cm 08/2008;4.44 in 7/10 and 4.65 in 08/2009; 4.8 by CT in 11/2009; 4.3 by ultrasound in 08/2010  . Anemia   . Arteriosclerotic cardiovascular disease (ASCVD) 1996   CABG-1996  . Arthritis    "fingers" (03/18/2014)  . CAD (coronary artery disease)    03/18/14:  PCI with DES to distal left main. 7/29: DES to the SVG to Diag  . Cancer (HCCWaucoma  Upper right lobe lung cancer  . Cardiomyopathy, ischemic    Echo 03/17/14: EF 45-50%  . Chronic bronchitis (HCCWelcome . Chronic kidney disease    CRF  . Chronic rhinitis   . Colonic polyp 2002   polypectomy in 2002  . COPD (chronic obstructive pulmonary disease) (HCCRonceverte . Diverticulosis   . Dyspnea    with exertion  . ED (erectile dysfunction)   . Encounter for antineoplastic chemotherapy 12/19/2015  . GERD (gastroesophageal reflux disease)   . History of blood transfusion   . Hyperlipidemia   . Hypertension   . IFG (impaired fasting glucose)   . [facility]  "told h/o silent MI sometime before 1996"  . Pneumonia ~ 2001; ~ 2005    has had more than twice  . Right bundle branch block   . Tobacco abuse, in remission    40 pack year total consumption; discontinued in 1996    Past Surgical History:  Procedure Laterality Date  . ABDOMINAL AORTIC ANEURYSM REPAIR  11/2012  . ABDOMINAL AORTIC ENDOVASCULAR STENT GRAFT N/A 12/11/2012   Procedure: ABDOMINAL AORTIC ENDOVASCULAR STENT GRAFT;  Surgeon: JamNelda Severe  Kellie Simmering, MD;  Location: [facility];  Service: Vascular;   Laterality: N/A;  Ultrasound guided; [place]  . CARDIAC CATHETERIZATION  01/08/1995  . COLONOSCOPY  2002   polypectomy-patient denies  . CORONARY ANGIOPLASTY WITH STENT PLACEMENT  03/18/2014   "1"  . CORONARY ANGIOPLASTY WITH STENT PLACEMENT  03/24/2014   "1"  . CORONARY ARTERY BYPASS GRAFT  01/09/1995   "CABG X3"  . FLEXIBLE BRONCHOSCOPY N/A 03/01/2017   Procedure: FLEXIBLE BRONCHOSCOPY WITH BIOPSIES;  Surgeon: Gaye Pollack, MD;  Location: [facility] OR;  Service: Thoracic;  Laterality: N/A;  . JOINT REPLACEMENT    . LAPAROSCOPIC CHOLECYSTECTOMY  12/2009  . LEFT AND RIGHT HEART CATHETERIZATION WITH CORONARY/GRAFT ANGIOGRAM N/A 03/18/2014   Procedure: LEFT AND RIGHT HEART CATHETERIZATION WITH Beatrix Fetters;  Surgeon: Blane Ohara, MD;  Location: [facility] CATH LAB;  Service: Cardiovascular;  Laterality: N/A;  . PERCUTANEOUS CORONARY STENT INTERVENTION (PCI-S)  03/18/2014   Procedure: PERCUTANEOUS CORONARY STENT INTERVENTION (PCI-S);  Surgeon: Blane Ohara, MD;  Location: [facility] CATH LAB;  Service: Cardiovascular;;  . PERCUTANEOUS CORONARY STENT INTERVENTION (PCI-S) N/A 03/24/2014   Procedure: PERCUTANEOUS CORONARY STENT INTERVENTION (PCI-S);  Surgeon: Blane Ohara, MD;  Location: [facility] CATH LAB;  Service: Cardiovascular;  Laterality: N/A;  . PLEURAL EFFUSION DRAINAGE Right 03/01/2017   Procedure: DRAINAGE OF PLEURAL EFFUSION;  Surgeon: Gaye Pollack, MD;  Location: [place];  Service: Thoracic;  Laterality: Right;  . RIGHT/LEFT HEART CATH AND CORONARY/GRAFT ANGIOGRAPHY N/A 05/15/2017   Procedure: RIGHT/LEFT HEART CATH AND CORONARY/GRAFT ANGIOGRAPHY;  Surgeon: Larey Dresser, MD;  Location: [place] CV LAB;  Service: Cardiovascular;  Laterality: N/A;  . TALC PLEURODESIS Right 03/01/2017   Procedure: Pietro Cassis;  Surgeon: Gaye Pollack, MD;  Location: [place];  Service: Thoracic;  Laterality: Right;  . TOTAL HIP ARTHROPLASTY Left 01/21/2013   Procedure: TOTAL HIP ARTHROPLASTY ANTERIOR APPROACH;   Surgeon: Mauri Pole, MD;  Location: [place];  Service: Orthopedics;  Laterality: Left;  Marland Kitchen VIDEO ASSISTED THORACOSCOPY Right 03/01/2017   Procedure: VIDEO ASSISTED THORACOSCOPY WITH BIOPSIES;  Surgeon: Gaye Pollack, MD;  Location: [place];  Service: Thoracic;  Laterality: Right;  Marland Kitchen VIDEO BRONCHOSCOPY N/A 11/17/2015   Procedure: VIDEO BRONCHOSCOPY WITH FLUORO;  Surgeon: Rigoberto Noel, MD;  Location: [place];  Service: Cardiopulmonary;  Laterality: N/A;    Outpatient Encounter Medications as of 07/08/2018  Medication Sig  . acetaminophen (TYLENOL) 500 MG tablet Take 500 mg by mouth every 6 (six) hours as needed for mild pain.  Marland Kitchen aspirin EC 81 MG tablet Take 81 mg by mouth daily before breakfast.   . budesonide-formoterol (SYMBICORT) 160-4.5 MCG/ACT inhaler Inhale 2 puffs into the lungs 2 (two) times daily.  . carvedilol (COREG) 12.5 MG tablet TAKE 1 TABLET BY MOUTH TWICE DAILY WITH A MEAL.  Marland Kitchen clopidogrel (PLAVIX) 75 MG tablet TAKE (1) TABLET BY MOUTH ONCE DAILY.  Marland Kitchen Ferrous Sulfate (IRON) 28 MG TABS Take 65 mg by mouth daily.   . furosemide (LASIX) 20 MG tablet Take 1 tablet (20 mg total) by mouth daily.  . GuaiFENesin (MUCUS RELIEF ADULT PO) Take 2 capsules by mouth 3 (three) times daily.  Marland Kitchen ipratropium-albuterol (DUONEB) 0.5-2.5 (3) MG/3ML SOLN INHALE 1 VIAL VIA NEBULIZER 4 TIMES DAILY.  . methylPREDNISolone (MEDROL) 4 MG tablet Take 4 mg by mouth every other day.  . Multiple Vitamins-Minerals (CENTRUM SILVER ADULT 50+) TABS Take 1 tablet by mouth daily with lunch.   Marland Kitchen NITROSTAT 0.4 MG SL tablet  PLACE 1 TAB UNDER TONGUE EVERY 5 MIN IF NEEDED FOR CHEST PAIN. MAY USE 3 TIMES.NO RELIEF CALL 911.  . omeprazole (PRILOSEC) 20 MG capsule TAKE ONE CAPSULE BY MOUTH DAILY.  Marland Kitchen PROAIR HFA 108 (90 Base) MCG/ACT inhaler INHALE 2 PUFFS BY MOUTH EVERY 4 TO 6 HOURS AS NEEDED FOR WHEEZING.  . Probiotic Product (FLORAJEN3 PO) Take 1 tablet by mouth daily.  . sacubitril-valsartan (ENTRESTO) 49-51 MG Take 1  tablet by mouth 2 (two) times daily.  . simvastatin (ZOCOR) 80 MG tablet Take 0.5 tablets (40 mg total) by mouth at bedtime.  Marland Kitchen spironolactone (ALDACTONE) 25 MG tablet Take 1 tablet (25 mg total) by mouth daily.  . [DISCONTINUED] ipratropium-albuterol (DUONEB) 0.5-2.5 (3) MG/3ML SOLN INHALE 1 VIAL VIA NEBULIZER 4 TIMES DAILY.  Marland Kitchen amoxicillin-clavulanate (AUGMENTIN) 875-125 MG tablet Take 1 tab twice a day as needed for infection   No facility-administered encounter medications on file as of 07/08/2018.     Allergies  Allergen Reactions  . Neomycin Hives    Immunization History  Administered Date(s) Administered  . H1N1 08/06/2008  . Influenza Split 06/24/2013  . Influenza,inj,Quad PF,6+ Mos 06/15/2014, 05/31/2015, 05/09/2016, 06/06/2017, 06/10/2018  . Influenza-Unspecified 05/27/2012  . Pneumococcal Conjugate-13 08/18/2014  . Pneumococcal Polysaccharide-23 03/27/2012  . Zoster 07/27/2008    Current Medications, Allergies, Past Medical History, Past Surgical History, Family History, and Social History were reviewed in [employer] record.   Review of Systems             All symptoms NEG except where BOLDED >>  Constitutional:  F/C/S, fatigue, anorexia, unexpected weight change. HEENT:  HA, visual changes, hearing loss, earache, nasal symptoms, sore throat, mouth sores, hoarseness. Resp:  cough, sputum, hemoptysis; SOB, tightness, wheezing. Cardio:  CP, palpit, DOE, orthopnea, edema. GI:  N/V/D/C, blood in stool; reflux, abd pain, distention, gas. GU:  dysuria, freq, urgency, hematuria, flank pain, voiding difficulty. MS:  joint pain, swelling, tenderness, decr ROM; neck pain, back pain, etc. Neuro:  HA, tremors, seizures, dizziness, syncope, weakness, numbness, gait abn. Skin:  suspicious lesions or skin rash. Heme:  adenopathy, bruising, bleeding. Psyche:  confusion, agitation, sleep disturbance, hallucinations, anxiety, depression  suicidal.   Objective:   Physical Exam       Vital Signs:  Reviewed...   General:  WD, WN, 77 y/o WM in NAD; alert & oriented; pleasant & cooperative... HEENT:  [place]/AT; Conjunctiva- pink, Sclera- nonicteric, EOM-wnl, PERRLA, EACs-clear, TMs-wnl; NOSE-clear; THROAT-clear & wnl.  Neck:  Supple w/ fair ROM; no JVD; normal carotid impulses w/ faint bruits; no thyromegaly or nodules palpated; no lymphadenopathy.  Chest:  decr BS right base & clear w/o w/r/r & no signs of consolid. Heart:  Regular Rhythm; norm S1 & S2 w/ Gr1/6 SEM without rubs or gallops detected. Abdomen:  Soft & nontender- no guarding or rebound; normal bowel sounds; no organomegaly or masses palpated. Ext:  Sl decr ROM; without deformities +arthritic changes; no varicose veins, +venous insuffic, tr edema;  Pulses decr w/o bruits. Neuro:  CNs II-XII intact; motor testing normal; sensory testing normal; gait normal & balance OK. Derm:  No lesions noted; no rash etc. Lymph:  No cervical, supraclavicular, axillary, or inguinal adenopathy palpated.   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     ASPVD- s/p AAA stent graft 11/2012, mod bilat carotid occlusive dis (asymptomatic) w/ CDopplers showing ~50-60% bilat ICAstenoses>  Stable & seen by [facility] 01/2016- plans yearly f/u CT Abd and CDopplers    MEDICAL issues>  HBP, HL,  GERD/ Divertic/ Polyps, CKD-stage3, Anemia & thrombocytopenia>  On Simva80-1/2Qd, Prilosec20/d, Fe/ MVI/ etc...  PLAN>>  02/29/16>   Problem list as above w/ signif cardiac, pulmonary, & post chemoradiation changes;  From the pulm standpoint- he feels the BREO is helping & hasn't needed the rescue inhaler very often, continue same;  He is on a long PRED taper per DrManning for poss radiation fibrosis RUL, Sed rate is 53, continue this taper & careful w/ sweets/ sugar etc;  From the cardiac standpoint- he has signif underlying dis w/ Lmain stent, s/p CABG 20 yrs ago, prob ischemic cardiomyopathy w/ worsening 2DEcho recently w/ 30-35% EF + HK&AK seen, BNP=1053, Cards plans ischemic work up when able, in the meanwhile he has improved w/ diuresis but Chems w/ mild RI & HCO3=36... REC- add DIAMOX-ER 500 one tab daily at 4pm, continue his Lasix40 Qam, increase water intake, watch weights... We plan ROV 1058monthrecheck... 04/04/16>   Discussed w/ Paul Sandoval-- continue the Levaquin til gone, use Tylenol for fever or pain; continue the Breo daily & Ventolin-HFA as needed; wean off the Diamox at this time, continue Lasix40, no salt, etc; he needs to gradually increase his exercise/ mobility; we will plan ROV w/ labs in 2758mo 06/06/16>   He is feeling better, performance status improving, CXR shows progressive changes in RUL s/p chemoradiation; CT=>PET scans w/ vol loss & persistent hypermetabolism/ evolving changes in RUL & right hilum plus he has a mod large right effusion; prev thoracentesis 01/2016 in hosp was transudative- I believe he would benefit from further eval/ repeat thoracentesis to recheck this fluid; we will set this up via IR & ask them to drain the fluid as much as poss, send it for Cytology, cell ct & diff, TProt/ LDH/ Gluc => 1.2L removed, prob exudate, NEG cytology, & we will follow... 08/06/16>   BiBrocs clinically stable, no clear evid of recurrent dis & followed very closely by DrMohamed; Pulm stable on  Symbicort Bid, Albut rescue prn, and his exercise- continue same 7 we plan rov recheck in 58m858mo12/18>    BilRaymir doing satis on Symbicort160-2spBid & Albut rescue inhaler prn; he states that he is active, energy is good, & that he's getting stronger & better (good performance status); I suspect that he has residual cancer in the right chest but it appears slowly progressive (w/ sl incr right effusion & subcarinal LN) and DrMohamed to review status & decide regarding additional therapy at this point vs continued observation...  01/02/17>   BilLi improved but it's been a slow process; he is at high risk for cancer progression & followed closely by DrMohamed who plans f/u CT & recheck in June;  Continue current meds, add Guaifenesin 2400m1m+ water to aid in mucus production... We will recheck in 96mo 658moview his f/u scans  planned by Oncology. 02/04/17>   SEE ABOVE - Rx Pred taper & Levaquin=> no better; R-Thoracentesis by IR w/ only 280cc exudative loculated fluid removed, cytology- NEG; Limited options at this point- Rx w/ Augmentin empirically, add NEBs w/ Albuterol Tid, continue GFN 2424m/d;  We will check 2DEcho and refer to TCTS to consider further Rx options for right effusion... 03/13/17>   The worsening LVF w/ EF 232-67%is certainly playing a roll in his unrespolved dyspnea=> refer to CHF clinic for active management; in the interim continue NEBs, Symbicort, Mucinex, etc.. 04/24/17>   BMaxmillianappears stable post T-surg & improved w/ CHF-clinic Rx per DrMcLean;  OK 2018 FLU shot today, and continue pulm rx w/ Albut NEBS Tid, Symbicort160-2spBid, GFN400-2Bid;  Call for any questions and we plan f/u visit in 218mo10/29/18>   As above- we discussed treating THIS COPD exac w/ BacterimDS & PRED taper; he is to continue w/ his NEBS, Symbicort, GFN and we plan recheck in 6wks time 08/08/17>   As noted above [employer] having a rough time lately>  He had the right VATS surg & talc pleurodesis 02/2017;  He has a  severe ischemic cardiomyopathy w/ EF= 20-25% & now followed closely by DrMcLean w/ freq (Q2m59moisits in the CHF clinic for medication titration;  He has severe obstructive lung dis w/ FEV1=1.25 (40%) in 01/2016 and freq exacerbations as documented above;  We reviewed recommendations for NEBS w/ DUONEB Qid, Symbicort160-2spBid, GFN400-2tabs Tid w/ fluids;  We are starting a 10d course of AUGMENTIN875Bid and a long slow MEDROL taper w/ planned recheck in 6wks;  He will continue to work on chest physiotherapy & bronchial mucus hygiene 09/19/17>   Paul Sandoval required mult antibiotics to get over his recent upper resp infection=> ZPak, then Bacterim, then Augmentin, then Omnicef; he remains on Medrol4mg76m & is asked to stay on this; he requested follow up labs (as above); reminded to stay as active as poss w/ a regular exercise program...  12/19/17>   Paul Sandoval is remarkably stable w/ his severe pulmonary & cardiac diseases;  We reviewed his medication regimen & rec to continue same;  We reviewed diet & exercise prescriptions;  We plan rov recheck 82mo 72moXR. 04/03/18>   Paul Sandoval Paul Landmarkec to continue his same meds- Medrol4mgQo34mDuoneb Qid, Symbicort160-2spBid, GFN 400-2tabsTid;  Continue meds and f/u w/ drMcLean (he has f/u 2DEcho soon);  We will continue 82mo in39moal visits & asked to call prn any changes in his status.   Plan:      Patient's Medications  New Prescriptions   AMOXICILLIN-CLAVULANATE (AUGMENTIN) 875-125 MG TABLET    Take 1 tab twice a day as needed for infection  Previous Medications   ACETAMINOPHEN (TYLENOL) 500 MG TABLET    Take 500 mg by mouth every 6 (six) hours as needed for mild pain.   ASPIRIN EC 81 MG TABLET    Take 81 mg by mouth daily before breakfast.    BUDESONIDE-FORMOTEROL (SYMBICORT) 160-4.5 MCG/ACT INHALER    Inhale 2 puffs into the lungs 2 (two) times daily.   CARVEDILOL (COREG) 12.5 MG TABLET    TAKE 1 TABLET BY MOUTH TWICE DAILY WITH A MEAL.   CLOPIDOGREL (PLAVIX) 75 MG TABLET    TAKE  (1) TABLET BY MOUTH ONCE DAILY.   FERROUS SULFATE (IRON) 28 MG TABS    Take 65 mg by mouth daily.    FUROSEMIDE (LASIX) 20 MG TABLET    Take 1 tablet (20 mg total) by mouth daily.  GUAIFENESIN (MUCUS RELIEF ADULT PO)    Take 2 capsules by mouth 3 (three) times daily.   IPRATROPIUM-ALBUTEROL (DUONEB) 0.5-2.5 (3) MG/3ML SOLN    INHALE 1 VIAL VIA NEBULIZER 4 TIMES DAILY.   METHYLPREDNISOLONE (MEDROL) 4 MG TABLET    Take 4 mg by mouth every other day.   MULTIPLE VITAMINS-MINERALS (CENTRUM SILVER ADULT 50+) TABS    Take 1 tablet by mouth daily with lunch.    NITROSTAT 0.4 MG SL TABLET    PLACE 1 TAB UNDER TONGUE EVERY 5 MIN IF NEEDED FOR CHEST PAIN. MAY USE 3 TIMES.NO RELIEF CALL 911.   OMEPRAZOLE (PRILOSEC) 20 MG CAPSULE    TAKE ONE CAPSULE BY MOUTH DAILY.   PROAIR HFA 108 (90 BASE) MCG/ACT INHALER    INHALE 2 PUFFS BY MOUTH EVERY 4 TO 6 HOURS AS NEEDED FOR WHEEZING.   PROBIOTIC PRODUCT (FLORAJEN3 PO)    Take 1 tablet by mouth daily.   SACUBITRIL-VALSARTAN (ENTRESTO) 49-51 MG    Take 1 tablet by mouth 2 (two) times daily.   SIMVASTATIN (ZOCOR) 80 MG TABLET    Take 0.5 tablets (40 mg total) by mouth at bedtime.   SPIRONOLACTONE (ALDACTONE) 25 MG TABLET    Take 1 tablet (25 mg total) by mouth daily.  Modified Medications   No medications on file  Discontinued Medications   IPRATROPIUM-ALBUTEROL (DUONEB) 0.5-2.5 (3) MG/3ML SOLN    INHALE 1 VIAL VIA NEBULIZER 4 TIMES DAILY.

## 2018-07-08 NOTE — Patient Instructions (Signed)
Today we updated your med list in our EPIC system...    Continue your current medications the same...  We called in a prescription for AUGMENTIN 875mg  tabs to take one tab twice daily for 10days for infection...  We will arrange for a follow up appt w/ my partner DrAlva in 3-4 months, recall that he is my associate who did your bronchoscopy in March 2017 & diagnosed your lung cancer...  Nieves-- it has been my great honor to have been one of your doctors over these last few years...   Sending you my very best wishes for good health & much happiness in the years to come!!!

## 2018-07-11 ENCOUNTER — Encounter (HOSPITAL_COMMUNITY): Payer: Self-pay | Admitting: Cardiology

## 2018-07-11 ENCOUNTER — Ambulatory Visit (HOSPITAL_COMMUNITY)
Admission: RE | Admit: 2018-07-11 | Discharge: 2018-07-11 | Disposition: A | Discharge status: Home / Self Care | Payer: Medicare Other | Source: Ambulatory Visit | Attending: Cardiology | Admitting: Cardiology

## 2018-07-11 VITALS — BP 136/62 | HR 81 | Wt 188.2 lb

## 2018-07-11 DIAGNOSIS — Z7902 Long term (current) use of antithrombotics/antiplatelets: Secondary | ICD-10-CM | POA: Diagnosis not present

## 2018-07-11 DIAGNOSIS — N182 Chronic kidney disease, stage 2 (mild): Secondary | ICD-10-CM | POA: Insufficient documentation

## 2018-07-11 DIAGNOSIS — Z7982 Long term (current) use of aspirin: Secondary | ICD-10-CM | POA: Diagnosis not present

## 2018-07-11 DIAGNOSIS — J90 Pleural effusion, not elsewhere classified: Secondary | ICD-10-CM | POA: Diagnosis not present

## 2018-07-11 DIAGNOSIS — I4510 Unspecified right bundle-branch block: Secondary | ICD-10-CM | POA: Insufficient documentation

## 2018-07-11 DIAGNOSIS — I255 Ischemic cardiomyopathy: Secondary | ICD-10-CM | POA: Insufficient documentation

## 2018-07-11 DIAGNOSIS — I2510 Atherosclerotic heart disease of native coronary artery without angina pectoris: Secondary | ICD-10-CM

## 2018-07-11 DIAGNOSIS — I25810 Atherosclerosis of coronary artery bypass graft(s) without angina pectoris: Secondary | ICD-10-CM | POA: Insufficient documentation

## 2018-07-11 DIAGNOSIS — I130 Hypertensive heart and chronic kidney disease with heart failure and stage 1 through stage 4 chronic kidney disease, or unspecified chronic kidney disease: Secondary | ICD-10-CM | POA: Insufficient documentation

## 2018-07-11 DIAGNOSIS — Z801 Family history of malignant neoplasm of trachea, bronchus and lung: Secondary | ICD-10-CM | POA: Insufficient documentation

## 2018-07-11 DIAGNOSIS — Z8249 Family history of ischemic heart disease and other diseases of the circulatory system: Secondary | ICD-10-CM | POA: Insufficient documentation

## 2018-07-11 DIAGNOSIS — E785 Hyperlipidemia, unspecified: Secondary | ICD-10-CM | POA: Insufficient documentation

## 2018-07-11 DIAGNOSIS — I5042 Chronic combined systolic (congestive) and diastolic (congestive) heart failure: Secondary | ICD-10-CM

## 2018-07-11 DIAGNOSIS — Z87891 Personal history of nicotine dependence: Secondary | ICD-10-CM | POA: Diagnosis not present

## 2018-07-11 DIAGNOSIS — J449 Chronic obstructive pulmonary disease, unspecified: Secondary | ICD-10-CM | POA: Insufficient documentation

## 2018-07-11 DIAGNOSIS — Z7951 Long term (current) use of inhaled steroids: Secondary | ICD-10-CM | POA: Diagnosis not present

## 2018-07-11 DIAGNOSIS — I5022 Chronic systolic (congestive) heart failure: Secondary | ICD-10-CM | POA: Diagnosis not present

## 2018-07-11 DIAGNOSIS — I25118 Atherosclerotic heart disease of native coronary artery with other forms of angina pectoris: Secondary | ICD-10-CM | POA: Diagnosis not present

## 2018-07-11 DIAGNOSIS — C3490 Malignant neoplasm of unspecified part of unspecified bronchus or lung: Secondary | ICD-10-CM | POA: Insufficient documentation

## 2018-07-11 MED ORDER — EPLERENONE 25 MG PO TABS: 25.0000 mg | ORAL_TABLET | Freq: Every day | ORAL | 6 refills | Status: DC

## 2018-07-11 MED ORDER — NITROGLYCERIN 0.4 MG SL SUBL: SUBLINGUAL_TABLET | SUBLINGUAL | 1 refills | Status: DC

## 2018-07-11 NOTE — Patient Instructions (Signed)
DISCONTINUE Spironolactone. AFTER one week, start Eplerenone 25mg  (1 tab) daily.  Your physician request that you have labs drawn in 2 weeks.   Your physician recommends that you schedule a follow-up appointment in: 3 months with Dr. Kendall Flack can start your antibiotic from lung doctor.

## 2018-07-13 NOTE — Progress Notes (Signed)
PCP: Dr. Wolfgang Phoenix Pulmonary: Dr. Lenna Gilford HF Cardiology: Dr. Aundra Dubin  77 y.o. with history of COPD, CAD s/p CABG in 1996 and PCI to dLM and SVG-D in 7/15, ischemic cardiomyopathy, and non-small cell lung cancer presents for followup of CHF and dyspnea.  He was initially referred to [place] by Dr. Lenna Gilford after fall in EF and worsening dyspnea was noted.   Patient reports that his initial symptoms prior to CABG in 1996 were dyspnea.  He has never had any worrisome chest pain.  He did well initially after CABG and was golfing regularly and walking 2-3 miles/day up until around 3/17.  He had one episode of increased dyspnea in 2015 and had cath with PCI to distal LM and SVG-D.  In 3/17, he was diagnosed with NSCLC and was treated with radiation and chemotherapy.  Since then, he has had progressive exertional dyspnea.  He was noted on echo in 7/18 to have EF down to 20-25%.    RHC/LHC was done in 9/18, showing occluded SVG-small D, patent LIMA-LAD and SVG-PDA, 50% shelf-like stenosis distal left main (no intervention), filling pressure not elevated and cardiac output preserved.   I increased his Entresto to 49/51 bid but BP dropped and he had orthostatic symptoms, so I decreased it back to 24/26 bid.   Echo in 8/19 showed that EF remains 30-35%.    Doing well overall.  Saw Dr. Earlie Server recently, has not had progression of his lung cancer based on last CT chest.  Having a bout of bronchitis with cough with yellow sputum, no fever. Generally doing well, he can walk 2 miles without dyspnea, does this 5 days/week.  No chest pain.  No orthopnea/PND.  He continues to have breast pain; he is on spironolactone.   Labs (9/17): LDL 90 Labs (7/18): K 3.5, creatinine 1.28, hgb 10.7 Labs (8/18): LDL 68 Labs (9/18): K 4.1, creatinine 1.2, hgb 11.1, plts 98 K Labs (10/18): K 4.1, creatinine 1.5 Labs (1/19): K 4.3, creatinine 1.54, LDL 80, HDL 49, LFTs normal, TSH normal Labs (4/19): K 4.3, creatinine 1.52, hgb 11.8 Labs  (7/19): K 4.1, creatinine 1.5, LDL 75, LFTs normal, hgb 12.2 Labs (10/19): K 4.3, creatinine 1.53  PMH: 1. COPD: PFTs (6/17) with severe obstructive airways disease.  2. AAA: s/p repair with stent graft.  3. Non-small cell lung cancer: Stage IIIA, diagnosed 3/17.  He had radiation as well as chemotherapy with carboplatin and paclitaxel.   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 PNA in 6/17 6. Recurrent right pleural effusion: VATS with talc pleurodesis on right.  No malignant cells noted.  7. HTN 8. H/o THR 9. CAD: CABG 1996 with SVG-D, SVG-PDA, LIMA-LAD.   - LHC (7/15): Totally occluded RCA and LAD.  Severe distal LM stenosis.  Patent SVG-PDA, patent LIMA-LAD.  Severe disease SVG-D.  Patient had DES to distal left main and staged PCI of SVG-D.   - Cardiolite (7/17): EF 30%, prior infarction with no ischemia.  - LHC (9/18): occluded SVG-small D, patent LIMA-LAD and SVG-PDA, 50% shelf-like stenosis distal left main (no intervention).  10. Chronic systolic CHF: Ischemic cardiomyopathy.   - Echo (6/17): EF 30-35%. - Echo (7/18): EF 20-25%, moderate RV dilation with mildly decreased RV systolic function.  - RHC (9/18): mean RA 3, PA 28/9 mean 18, mean PCWP 10, CI 4.05.  - Echo (8/19): EF 30-35%, mildly decreased RV systolic function.   Social History   Socioeconomic History  . Marital status: Married    Spouse name: Not on  file  . Number of children: 1  . Years of education: Not on file  . Highest education level: Not on file  Occupational History  . Occupation: Retired    Comment: [employer]  . Financial resource strain: Not on file  . Food insecurity:    Worry: Not on file    Inability: Not on file  . Transportation needs:    Medical: Not on file    Non-medical: Not on file  Tobacco Use  . Smoking status: Former Smoker    Packs/day: 1.50    Years: 30.00    Pack years: 45.00    Types: Cigarettes    Start date: 12/01/1956    Last attempt to quit: 01/08/1995    Years  since quitting: 23.5  . Smokeless tobacco: Never Used  Substance and Sexual Activity  . Alcohol use: No    Alcohol/week: 0.0 standard drinks    Frequency: Never    Comment: 03/18/2014 "no alacohol since 1996"  . Drug use: No  . Sexual activity: Never  Lifestyle  . Physical activity:    Days per week: Not on file    Minutes per session: Not on file  . Stress: Not on file  Relationships  . Social connections:    Talks on phone: Not on file    Gets together: Not on file    Attends religious service: Not on file    Active member of club or organization: Not on file    Attends meetings of clubs or organizations: Not on file    Relationship status: Not on file  . Intimate partner violence:    Fear of current or ex partner: Not on file    Emotionally abused: Not on file    Physically abused: Not on file    Forced sexual activity: Not on file  Other Topics Concern  . Not on file  Social History Narrative  . Not on file   Family History  Problem Relation Age of Onset  . Heart disease Father   . Cancer Father        Lung  . Arthritis Mother   . Parkinsonism Mother   . Arthritis Sister        Brother with rheumatoid arthritis  . Hypertension Brother   . Colon cancer Neg Hx   . Colon polyps Neg Hx    ROS: All systems reviewed and negative except as per HPI.   Current Outpatient Medications  Medication Sig Dispense Refill  . acetaminophen (TYLENOL) 500 MG tablet Take 500 mg by mouth every 6 (six) hours as needed for mild pain.    Marland Kitchen aspirin EC 81 MG tablet Take 81 mg by mouth daily before breakfast.     . budesonide-formoterol (SYMBICORT) 160-4.5 MCG/ACT inhaler Inhale 2 puffs into the lungs 2 (two) times daily.    . carvedilol (COREG) 12.5 MG tablet TAKE 1 TABLET BY MOUTH TWICE DAILY WITH A MEAL. 60 tablet 3  . clopidogrel (PLAVIX) 75 MG tablet TAKE (1) TABLET BY MOUTH ONCE DAILY. 30 tablet 5  . Ferrous Sulfate (IRON) 28 MG TABS Take 65 mg by mouth daily.     . furosemide  (LASIX) 20 MG tablet Take 1 tablet (20 mg total) by mouth daily. 30 tablet 3  . GuaiFENesin (MUCUS RELIEF ADULT PO) Take 2 capsules by mouth 3 (three) times daily.    Marland Kitchen ipratropium-albuterol (DUONEB) 0.5-2.5 (3) MG/3ML SOLN INHALE 1 VIAL VIA NEBULIZER 4 TIMES DAILY. 360 mL  3  . methylPREDNISolone (MEDROL) 4 MG tablet Take 4 mg by mouth every other day.    . Multiple Vitamins-Minerals (CENTRUM SILVER ADULT 50+) TABS Take 1 tablet by mouth daily with lunch.     . nitroGLYCERIN (NITROSTAT) 0.4 MG SL tablet PLACE 1 TAB UNDER TONGUE EVERY 5 MIN IF NEEDED FOR CHEST PAIN. MAY USE 3 TIMES.NO RELIEF CALL 911. 25 tablet 1  . omeprazole (PRILOSEC) 20 MG capsule TAKE ONE CAPSULE BY MOUTH DAILY. 90 capsule 0  . PROAIR HFA 108 (90 Base) MCG/ACT inhaler INHALE 2 PUFFS BY MOUTH EVERY 4 TO 6 HOURS AS NEEDED FOR WHEEZING. 8.5 g 0  . Probiotic Product (FLORAJEN3 PO) Take 1 tablet by mouth daily.    . sacubitril-valsartan (ENTRESTO) 49-51 MG Take 1 tablet by mouth 2 (two) times daily. 60 tablet 3  . simvastatin (ZOCOR) 80 MG tablet Take 0.5 tablets (40 mg total) by mouth at bedtime. 15 tablet 5  . amoxicillin-clavulanate (AUGMENTIN) 875-125 MG tablet Take 1 tab twice a day as needed for infection (Patient not taking: Reported on 07/11/2018) 20 tablet 1  . eplerenone (INSPRA) 25 MG tablet Take 1 tablet (25 mg total) by mouth daily. 30 tablet 6   No current facility-administered medications for this encounter.    BP 136/62   Pulse 81   Wt 85.4 kg (188 lb 3.2 oz)   SpO2 98%   BMI 27.00 kg/m  General: NAD Neck: No JVD, no thyromegaly or thyroid nodule.  Lungs: Rhonchi bilaterally CV: Nondisplaced PMI.  Heart regular S1/S2, no S3/S4, no murmur.  No peripheral edema.  No carotid bruit.  Normal pedal pulses.  Abdomen: Soft, nontender, no hepatosplenomegaly, no distention.  Skin: Intact without lesions or rashes.  Neurologic: Alert and oriented x 3.  Psych: Normal affect. Extremities: No clubbing or cyanosis.   HEENT: Normal.   Assessment/Plan: 1. Chronic systolic CHF: Ischemic cardiomyopathy.  Echo in 7/18 with EF 20-25%, echo 8/19 with EF 30-35%.  Currently, NYHA class II symptoms and feeling good overall.  He is not volume overloaded on exam.  - Continue Entresto 49/51 bid.  - Continue Lasix 20 mg daily,  - Continue Coreg 12.5 mg bid.   - Suspect breast pain is related to spironolactone.  Stop spironolactone, and after a week off will start eplerenone 25 mg daily.  BMET 2 wks.  - EF persistently low, qualifies for ICD.  He has a RBBB so not good CRT candidate.  Lung cancer has been quiescent and oncologic prognosis seems reasonably positive, so ICD would be reasonable for him.  He is still not sure that he wants an ICD and will think about it.   2. CAD: s/p CABG then PCI 7/15 to distal LM and SVG-D.  Anginal equivalent in the past appears to have been dyspnea, he has never had significant chest pain.  LHC (9/18) showed patent LIMA-LAD and SVG-PDA; occluded SVG-D; 50% shelf-like LM stenosis - Continue ASA 81, Plavix, and simvastatin.  3. COPD: Severe COPD by PFTs in 6/17.  Based on the last RHC/LHC, I suspect that COPD plays a major role in his mild ongoing dyspnea.   - Suspect acute bronchitis. I recommended that he take the antibiotic that his pulmonologist gave him recently.  4. Hyperlipidemia: Lipids acceptable in 7/19.  5. NSCLC: Follows with Dr. Earlie Server.  He has finished chemo/radiation and will get scans to assess for residual disease. He does not appear to have had any significantly cardiotoxic agents with chemotherapy. Cancer appeared  quiescent on most recent scans with Dr. Earlie Server.   6. Right pleural effusion: This has been chronic. S/p VATS.   Followup in 3 months.   Loralie Champagne 07/13/2018

## 2018-07-28 ENCOUNTER — Ambulatory Visit (HOSPITAL_COMMUNITY)
Admission: RE | Admit: 2018-07-28 | Discharge: 2018-07-28 | Disposition: A | Discharge status: Home / Self Care | Payer: Medicare Other | Source: Ambulatory Visit | Attending: Internal Medicine | Admitting: Internal Medicine

## 2018-07-28 DIAGNOSIS — J449 Chronic obstructive pulmonary disease, unspecified: Secondary | ICD-10-CM | POA: Diagnosis not present

## 2018-07-28 DIAGNOSIS — I5042 Chronic combined systolic (congestive) and diastolic (congestive) heart failure: Secondary | ICD-10-CM | POA: Insufficient documentation

## 2018-07-28 LAB — BASIC METABOLIC PANEL
Anion gap: 8 (ref 5–15)
BUN: 16 mg/dL (ref 8–23)
CO2: 30 mmol/L (ref 22–32)
Calcium: 9.3 mg/dL (ref 8.9–10.3)
Chloride: 99 mmol/L (ref 98–111)
Creatinine, Ser: 1.49 mg/dL — ABNORMAL HIGH (ref 0.61–1.24)
GFR calc Af Amer: 51 mL/min — ABNORMAL LOW (ref >60)
GFR, EST NON AFRICAN AMERICAN: 44 mL/min — AB (ref >60)
Glucose, Bld: 95 mg/dL (ref 70–99)
POTASSIUM: 4.1 mmol/L (ref 3.5–5.1)
SODIUM: 137 mmol/L (ref 135–145)

## 2018-08-27 DIAGNOSIS — J449 Chronic obstructive pulmonary disease, unspecified: Secondary | ICD-10-CM | POA: Diagnosis not present

## 2018-09-05 ENCOUNTER — Other Ambulatory Visit: Payer: Self-pay | Admitting: Pulmonary Disease

## 2018-09-27 DIAGNOSIS — J449 Chronic obstructive pulmonary disease, unspecified: Secondary | ICD-10-CM | POA: Diagnosis not present

## 2018-10-06 ENCOUNTER — Other Ambulatory Visit (HOSPITAL_COMMUNITY): Payer: Self-pay | Admitting: Cardiology

## 2018-10-10 ENCOUNTER — Encounter: Payer: Self-pay | Admitting: Family Medicine

## 2018-10-10 ENCOUNTER — Ambulatory Visit (INDEPENDENT_AMBULATORY_CARE_PROVIDER_SITE_OTHER): Payer: Medicare Other | Admitting: Family Medicine

## 2018-10-10 VITALS — BP 118/66 | Temp 98.5°F | Ht 70.0 in | Wt 197.0 lb

## 2018-10-10 DIAGNOSIS — J111 Influenza due to unidentified influenza virus with other respiratory manifestations: Secondary | ICD-10-CM | POA: Diagnosis not present

## 2018-10-10 MED ORDER — OSELTAMIVIR PHOSPHATE 75 MG PO CAPS: 75.0000 mg | ORAL_CAPSULE | Freq: Two times a day (BID) | ORAL | 0 refills | Status: AC

## 2018-10-10 NOTE — Progress Notes (Signed)
   Subjective:    Patient ID: Paul Sandoval, male    DOB: [birth year], 78 y.o.   MRN: [id number]  Fever   This is a new problem. Episode onset: this morning. Associated symptoms comments: Chills, body aches, fever 99.5 . He has tried acetaminophen for the symptoms.    Chills and fever  No porod     Some achiness   Felt bad last eve   Got the flu shot   Loose bowel movement   Energy poor   Appetite ok but not great  Wheezing not flared up    Review of Systems  Constitutional: Positive for fever.       Objective:   Physical Exam Alert vitals reviewed, moderate malaise. Hydration good. Positive nasal congestion lungs no crackles or wheezes, no tachypnea, intermittent bronchial cough during exam heart regular rate and rhythm.        Assessment & Plan:  Impression influenza discussed at length. Petra Kuba of illness and potential sequela discussed. Plan Tamiflu prescribed if indicated and timing appropriate. Symptom care discussed. Warning signs discussed. WSL Tamiflu prescribed.  Patient also started Augmentin his specialist provided in case of exacerbation of COPD.  I felt it was reasonable for patient to take both discussed warning signs discussed carefully

## 2018-10-16 ENCOUNTER — Other Ambulatory Visit: Payer: Self-pay

## 2018-10-16 ENCOUNTER — Ambulatory Visit (HOSPITAL_COMMUNITY)
Admission: RE | Admit: 2018-10-16 | Discharge: 2018-10-16 | Disposition: A | Discharge status: Home / Self Care | Payer: Medicare Other | Source: Ambulatory Visit | Attending: Cardiology | Admitting: Cardiology

## 2018-10-16 VITALS — BP 126/55 | HR 77 | Wt 194.4 lb

## 2018-10-16 DIAGNOSIS — I255 Ischemic cardiomyopathy: Secondary | ICD-10-CM | POA: Diagnosis not present

## 2018-10-16 DIAGNOSIS — N182 Chronic kidney disease, stage 2 (mild): Secondary | ICD-10-CM | POA: Diagnosis not present

## 2018-10-16 DIAGNOSIS — C3411 Malignant neoplasm of upper lobe, right bronchus or lung: Secondary | ICD-10-CM

## 2018-10-16 DIAGNOSIS — J449 Chronic obstructive pulmonary disease, unspecified: Secondary | ICD-10-CM | POA: Diagnosis not present

## 2018-10-16 DIAGNOSIS — J90 Pleural effusion, not elsewhere classified: Secondary | ICD-10-CM | POA: Diagnosis not present

## 2018-10-16 DIAGNOSIS — I4510 Unspecified right bundle-branch block: Secondary | ICD-10-CM | POA: Diagnosis not present

## 2018-10-16 DIAGNOSIS — C3490 Malignant neoplasm of unspecified part of unspecified bronchus or lung: Secondary | ICD-10-CM | POA: Insufficient documentation

## 2018-10-16 DIAGNOSIS — Z7982 Long term (current) use of aspirin: Secondary | ICD-10-CM | POA: Diagnosis not present

## 2018-10-16 DIAGNOSIS — I25118 Atherosclerotic heart disease of native coronary artery with other forms of angina pectoris: Secondary | ICD-10-CM | POA: Insufficient documentation

## 2018-10-16 DIAGNOSIS — Z7902 Long term (current) use of antithrombotics/antiplatelets: Secondary | ICD-10-CM | POA: Insufficient documentation

## 2018-10-16 DIAGNOSIS — Z955 Presence of coronary angioplasty implant and graft: Secondary | ICD-10-CM | POA: Diagnosis not present

## 2018-10-16 DIAGNOSIS — Z87891 Personal history of nicotine dependence: Secondary | ICD-10-CM | POA: Insufficient documentation

## 2018-10-16 DIAGNOSIS — I5042 Chronic combined systolic (congestive) and diastolic (congestive) heart failure: Secondary | ICD-10-CM | POA: Diagnosis not present

## 2018-10-16 DIAGNOSIS — I130 Hypertensive heart and chronic kidney disease with heart failure and stage 1 through stage 4 chronic kidney disease, or unspecified chronic kidney disease: Secondary | ICD-10-CM | POA: Insufficient documentation

## 2018-10-16 DIAGNOSIS — E785 Hyperlipidemia, unspecified: Secondary | ICD-10-CM | POA: Insufficient documentation

## 2018-10-16 DIAGNOSIS — I2510 Atherosclerotic heart disease of native coronary artery without angina pectoris: Secondary | ICD-10-CM

## 2018-10-16 DIAGNOSIS — Z79899 Other long term (current) drug therapy: Secondary | ICD-10-CM | POA: Insufficient documentation

## 2018-10-16 DIAGNOSIS — Z8249 Family history of ischemic heart disease and other diseases of the circulatory system: Secondary | ICD-10-CM | POA: Diagnosis not present

## 2018-10-16 LAB — BASIC METABOLIC PANEL
ANION GAP: 11 (ref 5–15)
BUN: 15 mg/dL (ref 8–23)
CO2: 25 mmol/L (ref 22–32)
Calcium: 8.8 mg/dL — ABNORMAL LOW (ref 8.9–10.3)
Chloride: 100 mmol/L (ref 98–111)
Creatinine, Ser: 1.46 mg/dL — ABNORMAL HIGH (ref 0.61–1.24)
GFR calc Af Amer: 53 mL/min — ABNORMAL LOW (ref >60)
GFR calc non Af Amer: 45 mL/min — ABNORMAL LOW (ref >60)
GLUCOSE: 107 mg/dL — AB (ref 70–99)
POTASSIUM: 4.1 mmol/L (ref 3.5–5.1)
Sodium: 136 mmol/L (ref 135–145)

## 2018-10-16 LAB — LIPID PANEL
Cholesterol: 124 mg/dL (ref 0–200)
HDL: 25 mg/dL — ABNORMAL LOW (ref >40)
LDL Cholesterol: 77 mg/dL (ref 0–99)
Total CHOL/HDL Ratio: 5 RATIO
Triglycerides: 111 mg/dL (ref <150)
VLDL: 22 mg/dL (ref 0–40)

## 2018-10-16 MED ORDER — CLOPIDOGREL BISULFATE 75 MG PO TABS: ORAL_TABLET | ORAL | 5 refills | Status: DC

## 2018-10-16 MED ORDER — FUROSEMIDE 20 MG PO TABS: 40.0000 mg | ORAL_TABLET | Freq: Every day | ORAL | 0 refills | Status: DC

## 2018-10-16 MED ORDER — CARVEDILOL 12.5 MG PO TABS: ORAL_TABLET | ORAL | 6 refills | Status: DC

## 2018-10-16 MED ORDER — FUROSEMIDE 40 MG PO TABS: 40.0000 mg | ORAL_TABLET | Freq: Every day | ORAL | 6 refills | Status: DC

## 2018-10-16 MED ORDER — SACUBITRIL-VALSARTAN 49-51 MG PO TABS: ORAL_TABLET | ORAL | 6 refills | Status: DC

## 2018-10-16 MED ORDER — EPLERENONE 50 MG PO TABS: 50.0000 mg | ORAL_TABLET | Freq: Every day | ORAL | 6 refills | Status: DC

## 2018-10-16 NOTE — Patient Instructions (Signed)
INCREASE Lasix 40mg  (1 tab) daily  INCREASE Eplerenone 50mg  (1 tab) daily  Labs today and repeat in 10 days We will only contact you if something comes back abnormal or we need to make some changes. Otherwise no news is good news!  Your physician recommends that you schedule a follow-up appointment in: 3 weeks with NP/PA clinic

## 2018-10-16 NOTE — Progress Notes (Signed)
PCP: Dr. Wolfgang Phoenix Pulmonary: Dr. Lenna Gilford HF Cardiology: Dr. Aundra Dubin  78 y.o. with history of COPD, CAD s/p CABG in 1996 and PCI to dLM and SVG-D in 7/15, ischemic cardiomyopathy, and non-small cell lung cancer presents for followup of CHF and dyspnea.  He was initially referred to [place] by Dr. Lenna Gilford after fall in EF and worsening dyspnea was noted.   Patient reports that his initial symptoms prior to CABG in 1996 were dyspnea.  He has never had any worrisome chest pain.  He did well initially after CABG and was golfing regularly and walking 2-3 miles/day up until around 3/17.  He had one episode of increased dyspnea in 2015 and had cath with PCI to distal LM and SVG-D.  In 3/17, he was diagnosed with NSCLC and was treated with radiation and chemotherapy.  Since then, he has had progressive exertional dyspnea.  He was noted on echo in 7/18 to have EF down to 20-25%.    RHC/LHC was done in 9/18, showing occluded SVG-small D, patent LIMA-LAD and SVG-PDA, 50% shelf-like stenosis distal left main (no intervention), filling pressure not elevated and cardiac output preserved.   I increased his Entresto to 49/51 bid but BP dropped and he had orthostatic symptoms, so I decreased it back to 24/26 bid.   Echo in 8/19 showed that EF remains 30-35%.    Patient returns for followup of CAD and CHF.  He has had recent acute bronchitis/AECOPD and has been on Augmentin.  Prior to this, he had been feeling very good.  Minimal exertional dyspnea and no chest pain. No orthopnea/PND. His weight is up 6 lbs today.    Labs (9/17): LDL 90 Labs (7/18): K 3.5, creatinine 1.28, hgb 10.7 Labs (8/18): LDL 68 Labs (9/18): K 4.1, creatinine 1.2, hgb 11.1, plts 98 K Labs (10/18): K 4.1, creatinine 1.5 Labs (1/19): K 4.3, creatinine 1.54, LDL 80, HDL 49, LFTs normal, TSH normal Labs (4/19): K 4.3, creatinine 1.52, hgb 11.8 Labs (7/19): K 4.1, creatinine 1.5, LDL 75, LFTs normal, hgb 12.2 Labs (10/19): K 4.3, creatinine 1.53 Labs  (12/19): K 4.1, creatinine 1.49  PMH: 1. COPD: PFTs (6/17) with severe obstructive airways disease.  2. AAA: s/p repair with stent graft.  3. Non-small cell lung cancer: Stage IIIA, diagnosed 3/17.  He had radiation as well as chemotherapy with carboplatin and paclitaxel.   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 PNA in 6/17 6. Recurrent right pleural effusion: VATS with talc pleurodesis on right.  No malignant cells noted.  7. HTN 8. H/o THR 9. CAD: CABG 1996 with SVG-D, SVG-PDA, LIMA-LAD.   - LHC (7/15): Totally occluded RCA and LAD.  Severe distal LM stenosis.  Patent SVG-PDA, patent LIMA-LAD.  Severe disease SVG-D.  Patient had DES to distal left main and staged PCI of SVG-D.   - Cardiolite (7/17): EF 30%, prior infarction with no ischemia.  - LHC (9/18): occluded SVG-small D, patent LIMA-LAD and SVG-PDA, 50% shelf-like stenosis distal left main (no intervention).  10. Chronic systolic CHF: Ischemic cardiomyopathy.   - Echo (6/17): EF 30-35%. - Echo (7/18): EF 20-25%, moderate RV dilation with mildly decreased RV systolic function.  - RHC (9/18): mean RA 3, PA 28/9 mean 18, mean PCWP 10, CI 4.05.  - Echo (8/19): EF 30-35%, mildly decreased RV systolic function.   Social History   Socioeconomic History  . Marital status: Married    Spouse name: Not on file  . Number of children: 1  . Years of education:  Not on file  . Highest education level: Not on file  Occupational History  . Occupation: Retired    Comment: [employer]  . Financial resource strain: Not on file  . Food insecurity:    Worry: Not on file    Inability: Not on file  . Transportation needs:    Medical: Not on file    Non-medical: Not on file  Tobacco Use  . Smoking status: Former Smoker    Packs/day: 1.50    Years: 30.00    Pack years: 45.00    Types: Cigarettes    Start date: 12/01/1956    Last attempt to quit: 01/08/1995    Years since quitting: 23.7  . Smokeless tobacco: Never Used  Substance and  Sexual Activity  . Alcohol use: No    Alcohol/week: 0.0 standard drinks    Frequency: Never    Comment: 03/18/2014 "no alacohol since 1996"  . Drug use: No  . Sexual activity: Never  Lifestyle  . Physical activity:    Days per week: Not on file    Minutes per session: Not on file  . Stress: Not on file  Relationships  . Social connections:    Talks on phone: Not on file    Gets together: Not on file    Attends religious service: Not on file    Active member of club or organization: Not on file    Attends meetings of clubs or organizations: Not on file    Relationship status: Not on file  . Intimate partner violence:    Fear of current or ex partner: Not on file    Emotionally abused: Not on file    Physically abused: Not on file    Forced sexual activity: Not on file  Other Topics Concern  . Not on file  Social History Narrative  . Not on file   Family History  Problem Relation Age of Onset  . Heart disease Father   . Cancer Father        Lung  . Arthritis Mother   . Parkinsonism Mother   . Arthritis Sister        Brother with rheumatoid arthritis  . Hypertension Brother   . Colon cancer Neg Hx   . Colon polyps Neg Hx    ROS: All systems reviewed and negative except as per HPI.   Current Outpatient Medications  Medication Sig Dispense Refill  . acetaminophen (TYLENOL) 500 MG tablet Take 500 mg by mouth every 6 (six) hours as needed for mild pain.    Marland Kitchen amoxicillin-clavulanate (AUGMENTIN) 875-125 MG tablet Take 1 tablet by mouth 2 (two) times daily.    Marland Kitchen aspirin EC 81 MG tablet Take 81 mg by mouth daily before breakfast.     . budesonide-formoterol (SYMBICORT) 160-4.5 MCG/ACT inhaler Inhale 2 puffs into the lungs 2 (two) times daily.    . carvedilol (COREG) 12.5 MG tablet TAKE (1) TABLET BY MOUTH TWICE DAILY WITH A MEAL 60 tablet 6  . clopidogrel (PLAVIX) 75 MG tablet TAKE (1) TABLET BY MOUTH ONCE DAILY. 30 tablet 5  . eplerenone (INSPRA) 50 MG tablet Take 1 tablet  (50 mg total) by mouth daily. 30 tablet 6  . Ferrous Sulfate (IRON) 28 MG TABS Take 65 mg by mouth daily.     . furosemide (LASIX) 40 MG tablet Take 1 tablet (40 mg total) by mouth daily. 30 tablet 6  . GuaiFENesin (MUCUS RELIEF ADULT PO) Take 2 capsules by  mouth 3 (three) times daily.    Marland Kitchen ipratropium-albuterol (DUONEB) 0.5-2.5 (3) MG/3ML SOLN INHALE 1 VIAL VIA NEBULIZER 4 TIMES DAILY. 360 mL 0  . methylPREDNISolone (MEDROL) 4 MG tablet Take 4 mg by mouth every other day.    . Multiple Vitamins-Minerals (CENTRUM SILVER ADULT 50+) TABS Take 1 tablet by mouth daily with lunch.     . nitroGLYCERIN (NITROSTAT) 0.4 MG SL tablet PLACE 1 TAB UNDER TONGUE EVERY 5 MIN IF NEEDED FOR CHEST PAIN. MAY USE 3 TIMES.NO RELIEF CALL 911. 25 tablet 1  . omeprazole (PRILOSEC) 20 MG capsule TAKE ONE CAPSULE BY MOUTH DAILY. 90 capsule 0  . PROAIR HFA 108 (90 Base) MCG/ACT inhaler INHALE 2 PUFFS BY MOUTH EVERY 4 TO 6 HOURS AS NEEDED FOR WHEEZING. 8.5 g 0  . Probiotic Product (FLORAJEN3 PO) Take 1 tablet by mouth daily.    . sacubitril-valsartan (ENTRESTO) 49-51 MG TAKE (1) TABLET BY MOUTH TWICE DAILY. 60 tablet 6  . simvastatin (ZOCOR) 80 MG tablet Take 0.5 tablets (40 mg total) by mouth at bedtime. 15 tablet 5   No current facility-administered medications for this encounter.    BP (!) 126/55   Pulse 77   Wt 88.2 kg (194 lb 6.4 oz)   SpO2 95%   BMI 27.89 kg/m  General: NAD Neck: JVP 8-9 cm with HJR, no thyromegaly or thyroid nodule.  Lungs: Rhonchi bilaterally CV: Nondisplaced PMI.  Heart regular S1/S2, no S3/S4, no murmur.  1+ ankle edema.  No carotid bruit.  Normal pedal pulses.  Abdomen: Soft, nontender, no hepatosplenomegaly, no distention.  Skin: Intact without lesions or rashes.  Neurologic: Alert and oriented x 3.  Psych: Normal affect. Extremities: No clubbing or cyanosis.  HEENT: Normal.   Assessment/Plan: 1. Chronic systolic CHF: Ischemic cardiomyopathy.  Echo in 7/18 with EF 20-25%, echo 8/19  with EF 30-35%.  Generally, NYHA class II symptoms.  Today, his weight is up and he appears at least mildly volume overloaded.  - Continue Entresto 49/51 bid.  - Increase Lasix to 40 mg daily, BMET today and in 10 days.   - Continue Coreg 12.5 mg bid.   - Increase eplerenone to 50 mg daily.   - EF persistently low, qualifies for ICD.  He has a RBBB so not good CRT candidate.  Lung cancer has been quiescent and oncologic prognosis seems reasonably positive, so ICD would be reasonable for him.  He is currently not interested in getting an ICD.   2. CAD: s/p CABG then PCI 7/15 to distal LM and SVG-D.  Anginal equivalent in the past appears to have been dyspnea, he has never had significant chest pain.  LHC (9/18) showed patent LIMA-LAD and SVG-PDA; occluded SVG-D; 50% shelf-like LM stenosis - Continue ASA 81, Plavix, and simvastatin.  3. COPD: Severe COPD by PFTs in 6/17.  Based on the last RHC/LHC, I suspect that COPD plays a major role in his mild ongoing dyspnea.   4. Hyperlipidemia: Check lipids today.  5. NSCLC: Follows with Dr. Earlie Server.  He has finished chemo/radiation. Cancer appeared quiescent on most recent scans with Dr. Earlie Server.   6. Right pleural effusion: This has been chronic. S/p VATS.   Followup in 3 wks with APP to reassess volume.   Loralie Champagne 10/16/2018

## 2018-10-27 ENCOUNTER — Other Ambulatory Visit (HOSPITAL_COMMUNITY): Payer: Medicare Other

## 2018-10-27 ENCOUNTER — Other Ambulatory Visit: Payer: Self-pay | Admitting: Pulmonary Disease

## 2018-10-27 ENCOUNTER — Ambulatory Visit (HOSPITAL_COMMUNITY)
Admission: RE | Admit: 2018-10-27 | Discharge: 2018-10-27 | Disposition: A | Discharge status: Home / Self Care | Payer: Medicare Other | Source: Ambulatory Visit | Attending: Internal Medicine | Admitting: Internal Medicine

## 2018-10-27 DIAGNOSIS — J449 Chronic obstructive pulmonary disease, unspecified: Secondary | ICD-10-CM | POA: Diagnosis not present

## 2018-10-27 DIAGNOSIS — I5042 Chronic combined systolic (congestive) and diastolic (congestive) heart failure: Secondary | ICD-10-CM | POA: Insufficient documentation

## 2018-10-27 LAB — BASIC METABOLIC PANEL
Anion gap: 7 (ref 5–15)
BUN: 15 mg/dL (ref 8–23)
CO2: 29 mmol/L (ref 22–32)
Calcium: 9 mg/dL (ref 8.9–10.3)
Chloride: 100 mmol/L (ref 98–111)
Creatinine, Ser: 1.46 mg/dL — ABNORMAL HIGH (ref 0.61–1.24)
GFR calc Af Amer: 53 mL/min — ABNORMAL LOW (ref >60)
GFR calc non Af Amer: 45 mL/min — ABNORMAL LOW (ref >60)
Glucose, Bld: 99 mg/dL (ref 70–99)
Potassium: 4.2 mmol/L (ref 3.5–5.1)
Sodium: 136 mmol/L (ref 135–145)

## 2018-10-31 ENCOUNTER — Telehealth: Payer: Self-pay | Admitting: Pulmonary Disease

## 2018-10-31 MED ORDER — DOXYCYCLINE HYCLATE 100 MG PO TABS: 100.0000 mg | ORAL_TABLET | Freq: Two times a day (BID) | ORAL | 0 refills | Status: DC

## 2018-10-31 NOTE — Telephone Encounter (Addendum)
Called the patient and he stated the cough has been going on for a while, because of COPD. But it has started to get worse. The congestion normally will be clear or white. But this morning it was yellow, not thick or dark in color.  Patient stated he has taken Robitussin DM for the last couple days, denied taking any other OTC's. Patient denied fever, SOB. He said that he is trying to get ahead of it before it gets worse.  Patient cough worse in late afternoon and in evening when laying down. Patient wanted to know if something can be sent to [facility]. He gave permission to leave a message on his vmail if he cannot pick up.  Message routed to app of the day Wyn Quaker, NP)  to review.  Aaron Edelman, when you get a chance, please take a look at the above and advise if something can be sent to pharmacy. Thank you.

## 2018-10-31 NOTE — Telephone Encounter (Signed)
Sorry to hear the patient is not feeling well.  Is the patient wheezing?  Any fevers?  Any nasal congestion?  Is he taking a daily allergy pill?  Is the cough always productive?  Or is is sometimes a dry cough?  Any increased shortness of breath?  Wyn Quaker, FNP

## 2018-10-31 NOTE — Telephone Encounter (Signed)
Called the patient back. He denied wheezing, but has used Duoneb this morning, as still using the Symbicort twice a day as directed. Has not used ProAir.  Cough is not always productive, he stated there is some dryness in between.  Not using any allergy medication. Denied nasal congestion. Denied increased shortness of breath. Denied fever.  Message routed back to Paul Quaker, NP to review and advise.

## 2018-10-31 NOTE — Telephone Encounter (Signed)
Can offer:   Doxycycline >>> 1 100 mg tablet every 12 hours for 7 days >>>take with food  >>>wear sunscreen     If symptoms worsen patient needs to present to our office.  Patient needs to keep March/24/2020 appointment with Dr. Elsworth Soho.  Wyn Quaker, FNP

## 2018-10-31 NOTE — Telephone Encounter (Signed)
Called the patient back and advised him of the prescription offered. Advised the patient to call if his symptoms worsen, color of congestion darkens, gets green, brown, or if he develops a fever, sob.   Reminded patient of future appointment on 11/18/18 with Dr. Elsworth Soho.  Patient voiced understanding and said he will call if any of his symptoms get worse, or develops anything new such as fever, wheeing, sob.  Nothing further needed at this time.

## 2018-11-05 NOTE — Progress Notes (Addendum)
PCP: Dr. Wolfgang Phoenix Pulmonary: Dr. Lenna Gilford HF Cardiology: Dr. Aundra Dubin  78 y.o. with history of COPD, CAD s/p CABG in 1996 and PCI to dLM and SVG-D in 7/15, ischemic cardiomyopathy, and non-small cell lung cancer presents for followup of CHF and dyspnea.  He was initially referred to [place] by Dr. Lenna Gilford after fall in EF and worsening dyspnea was noted.   Patient reports that his initial symptoms prior to CABG in 1996 were dyspnea.  He has never had any worrisome chest pain.  He did well initially after CABG and was golfing regularly and walking 2-3 miles/day up until around 3/17.  He had one episode of increased dyspnea in 2015 and had cath with PCI to distal LM and SVG-D.  In 3/17, he was diagnosed with NSCLC and was treated with radiation and chemotherapy.  Since then, he has had progressive exertional dyspnea.  He was noted on echo in 7/18 to have EF down to 20-25%.    RHC/LHC was done in 9/18, showing occluded SVG-small D, patent LIMA-LAD and SVG-PDA, 50% shelf-like stenosis distal left main (no intervention), filling pressure not elevated and cardiac output preserved.   Dr Aundra Dubin increased his Delene Loll to 49/51 bid but BP dropped and he had orthostatic symptoms, so I decreased it back to 24/26 bid.   Echo in 8/19 showed that EF remains 30-35%.    He returns today for 3 week follow up. Last visit lasix and eplrenone were increased. Recheck labs were stable with creatinine at baseline,1.46. Overall doing well. Walking 1 mile 5 days/week with rare SOB. No CP. No edema, orthopnea, or PND. Has chronic cough due to COPD. No dizziness recently. [facility] Ent[facility]o and eplrenone. We need to call them. He is still taking both meds. Now limiting fluid and salt intake. SBP 110-120s at home. HR 70-80s. Weight is down 6 lbs, back to baseline.   Labs (9/17): LDL 90 Labs (7/18): K 3.5, creatinine 1.28, hgb 10.7 Labs (8/18): LDL 68 Labs (9/18): K 4.1, creatinine 1.2, hgb 11.1, plts 98  K Labs (10/18): K 4.1, creatinine 1.5 Labs (1/19): K 4.3, creatinine 1.54, LDL 80, HDL 49, LFTs normal, TSH normal Labs (4/19): K 4.3, creatinine 1.52, hgb 11.8 Labs (7/19): K 4.1, creatinine 1.5, LDL 75, LFTs normal, hgb 12.2 Labs (10/19): K 4.3, creatinine 1.53 Labs (12/19): K 4.1, creatinine 1.49  PMH: 1. COPD: PFTs (6/17) with severe obstructive airways disease.  2. AAA: s/p repair with stent graft.  3. Non-small cell lung cancer: Stage IIIA, diagnosed 3/17.  He had radiation as well as chemotherapy with carboplatin and paclitaxel.   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 PNA in 6/17 6. Recurrent right pleural effusion: VATS with talc pleurodesis on right.  No malignant cells noted.  7. HTN 8. H/o THR 9. CAD: CABG 1996 with SVG-D, SVG-PDA, LIMA-LAD.   - LHC (7/15): Totally occluded RCA and LAD.  Severe distal LM stenosis.  Patent SVG-PDA, patent LIMA-LAD.  Severe disease SVG-D.  Patient had DES to distal left main and staged PCI of SVG-D.   - Cardiolite (7/17): EF 30%, prior infarction with no ischemia.  - LHC (9/18): occluded SVG-small D, patent LIMA-LAD and SVG-PDA, 50% shelf-like stenosis distal left main (no intervention).  10. Chronic systolic CHF: Ischemic cardiomyopathy.   - Echo (6/17): EF 30-35%. - Echo (7/18): EF 20-25%, moderate RV dilation with mildly decreased RV systolic function.  - RHC (9/18): mean RA 3, PA 28/9 mean 18, mean PCWP 10,  CI 4.05.  - Echo (8/19): EF 30-35%, mildly decreased RV systolic function.   Social History   Socioeconomic History  . Marital status: Married    Spouse name: Not on file  . Number of children: 1  . Years of education: Not on file  . Highest education level: Not on file  Occupational History  . Occupation: Retired    Comment: [employer]  . Financial resource strain: Not on file  . Food insecurity:    Worry: Not on file    Inability: Not on file  . Transportation needs:    Medical: Not on file    Non-medical: Not on  file  Tobacco Use  . Smoking status: Former Smoker    Packs/day: 1.50    Years: 30.00    Pack years: 45.00    Types: Cigarettes    Start date: 12/01/1956    Last attempt to quit: 01/08/1995    Years since quitting: 23.8  . Smokeless tobacco: Never Used  Substance and Sexual Activity  . Alcohol use: No    Alcohol/week: 0.0 standard drinks    Frequency: Never    Comment: 03/18/2014 "no alacohol since 1996"  . Drug use: No  . Sexual activity: Never  Lifestyle  . Physical activity:    Days per week: Not on file    Minutes per session: Not on file  . Stress: Not on file  Relationships  . Social connections:    Talks on phone: Not on file    Gets together: Not on file    Attends religious service: Not on file    Active member of club or organization: Not on file    Attends meetings of clubs or organizations: Not on file    Relationship status: Not on file  . Intimate partner violence:    Fear of current or ex partner: Not on file    Emotionally abused: Not on file    Physically abused: Not on file    Forced sexual activity: Not on file  Other Topics Concern  . Not on file  Social History Narrative  . Not on file   Family History  Problem Relation Age of Onset  . Heart disease Father   . Cancer Father        Lung  . Arthritis Mother   . Parkinsonism Mother   . Arthritis Sister        Brother with rheumatoid arthritis  . Hypertension Brother   . Colon cancer Neg Hx   . Colon polyps Neg Hx    Review of systems complete and found to be negative unless listed in HPI.   Current Outpatient Medications  Medication Sig Dispense Refill  . acetaminophen (TYLENOL) 500 MG tablet Take 500 mg by mouth every 6 (six) hours as needed for mild pain.    Marland Kitchen aspirin EC 81 MG tablet Take 81 mg by mouth daily before breakfast.     . budesonide-formoterol (SYMBICORT) 160-4.5 MCG/ACT inhaler Inhale 2 puffs into the lungs 2 (two) times daily.    . carvedilol (COREG) 12.5 MG tablet TAKE (1)  TABLET BY MOUTH TWICE DAILY WITH A MEAL 60 tablet 6  . clopidogrel (PLAVIX) 75 MG tablet TAKE (1) TABLET BY MOUTH ONCE DAILY. 30 tablet 5  . doxycycline (VIBRA-TABS) 100 MG tablet Take 1 tablet (100 mg total) by mouth 2 (two) times daily. 14 tablet 0  . eplerenone (INSPRA) 50 MG tablet Take 1 tablet (50 mg total) by  mouth daily. 30 tablet 6  . Ferrous Sulfate (IRON) 28 MG TABS Take 65 mg by mouth daily.     . furosemide (LASIX) 40 MG tablet Take 1 tablet (40 mg total) by mouth daily. 30 tablet 6  . GuaiFENesin (MUCUS RELIEF ADULT PO) Take 2 capsules by mouth 3 (three) times daily.    Marland Kitchen ipratropium-albuterol (DUONEB) 0.5-2.5 (3) MG/3ML SOLN INHALE 1 VIAL VIA NEBULIZER 4 TIMES DAILY. 360 mL 0  . methylPREDNISolone (MEDROL) 4 MG tablet Take 4 mg by mouth every other day.    . Multiple Vitamins-Minerals (CENTRUM SILVER ADULT 50+) TABS Take 1 tablet by mouth daily with lunch.     . nitroGLYCERIN (NITROSTAT) 0.4 MG SL tablet PLACE 1 TAB UNDER TONGUE EVERY 5 MIN IF NEEDED FOR CHEST PAIN. MAY USE 3 TIMES.NO RELIEF CALL 911. 25 tablet 1  . omeprazole (PRILOSEC) 20 MG capsule TAKE ONE CAPSULE BY MOUTH DAILY. 90 capsule 0  . PROAIR HFA 108 (90 Base) MCG/ACT inhaler INHALE 2 PUFFS BY MOUTH EVERY 4 TO 6 HOURS AS NEEDED FOR WHEEZING. 8.5 g 0  . Probiotic Product (FLORAJEN3 PO) Take 1 tablet by mouth daily.    . sacubitril-valsartan (ENTRESTO) 49-51 MG TAKE (1) TABLET BY MOUTH TWICE DAILY. 60 tablet 6  . simvastatin (ZOCOR) 80 MG tablet Take 0.5 tablets (40 mg total) by mouth at bedtime. 15 tablet 5   No current facility-administered medications for this encounter.    BP 128/74   Pulse 83   Wt 85.5 kg (188 lb 9.6 oz)   SpO2 94%   BMI 27.06 kg/m  Wt Readings from Last 3 Encounters:  11/06/18 85.5 kg (188 lb 9.6 oz)  10/16/18 88.2 kg (194 lb 6.4 oz)  10/10/18 89.4 kg (197 lb)   General: Well appearing. No resp difficulty. Walked into clinic.  HEENT: Normal Neck: Supple. JVP flat. Carotids 2+ bilat;  no bruits. No thyromegaly or nodule noted. Cor: PMI nondisplaced. RRR, No M/G/R noted Lungs: CTAB, normal effort. Abdomen: Soft, non-tender, non-distended, no HSM. No bruits or masses. +BS  Extremities: No cyanosis, clubbing, or rash. R and LLE no edema.  Neuro: Alert & orientedx3, cranial nerves grossly intact. moves all 4 extremities w/o difficulty. Affect pleasant   Assessment/Plan: 1. Chronic systolic CHF: Ischemic cardiomyopathy.  Echo in 7/18 with EF 20-25%, echo 8/19 with EF 30-35%.  Generally, NYHA class II symptoms.  Volume stable on exam.   - Continue Lasix 40 mg daily. Reviewed labs and creatinine was stable after change.  - Continue Entresto 49/51 bid.  - Increase coreg to 18.75 mg BID. He will call us if he does not tolerate - Continue eplerenone 50 mg daily. +gynecomastia with spiro.  - EF persistently low, qualifies for ICD.  He has a RBBB so not good CRT candidate.  Lung cancer has been quiescent and oncologic prognosis seems reasonably positive, so ICD would be reasonable for him.  He is currently not interested in getting an ICD.  Discussed again today. He wants to discuss with his oncologist and pulmonologist first. Declines EP referral.  2. CAD: s/p CABG then PCI 7/15 to distal LM and SVG-D.  Anginal equivalent in the past appears to have been dyspnea, he has never had significant chest pain.  LHC (9/18) showed patent LIMA-LAD and SVG-PDA; occluded SVG-D; 50% shelf-like LM stenosis. No s/s ischemia.  - Continue ASA 81, Plavix, and simvastatin.  3. COPD: Severe COPD by PFTs in 6/17.  Based on the last RHC/LHC, I suspect  that COPD plays a major role in his mild ongoing dyspnea.   4. Hyperlipidemia: LDL 77 on 2/20.  5. NSCLC: Follows with Dr. Earlie Server.  He has finished chemo/radiation. Cancer appeared quiescent on most recent scans with Dr. Earlie Server.   6. Right pleural effusion: This has been chronic. S/p VATS. No change.   Increase coreg to 18.75 mg BID. [facility]  [facility] eplerenone. They need my note from today to refill medications. Will send today. He is followed by [facility] by Thornton Papas. Follow up in 3 months with Dr Aundra Dubin.   Georgiana Shore, NP 11/06/2018  Greater than 50% of the 25 minute visit was spent in counseling/coordination of care regarding disease state education, salt/fluid restriction, sliding scale diuretics, and medication compliance.

## 2018-11-06 ENCOUNTER — Ambulatory Visit (HOSPITAL_COMMUNITY)
Admission: RE | Admit: 2018-11-06 | Discharge: 2018-11-06 | Disposition: A | Discharge status: Home / Self Care | Payer: Medicare Other | Source: Ambulatory Visit | Attending: Internal Medicine | Admitting: Internal Medicine

## 2018-11-06 ENCOUNTER — Other Ambulatory Visit: Payer: Self-pay

## 2018-11-06 ENCOUNTER — Encounter (HOSPITAL_COMMUNITY): Payer: Self-pay

## 2018-11-06 ENCOUNTER — Telehealth (HOSPITAL_COMMUNITY): Payer: Self-pay | Admitting: Cardiology

## 2018-11-06 VITALS — BP 128/74 | HR 83 | Wt 188.6 lb

## 2018-11-06 DIAGNOSIS — E785 Hyperlipidemia, unspecified: Secondary | ICD-10-CM | POA: Insufficient documentation

## 2018-11-06 DIAGNOSIS — I130 Hypertensive heart and chronic kidney disease with heart failure and stage 1 through stage 4 chronic kidney disease, or unspecified chronic kidney disease: Secondary | ICD-10-CM | POA: Diagnosis not present

## 2018-11-06 DIAGNOSIS — Z7951 Long term (current) use of inhaled steroids: Secondary | ICD-10-CM | POA: Diagnosis not present

## 2018-11-06 DIAGNOSIS — Z8249 Family history of ischemic heart disease and other diseases of the circulatory system: Secondary | ICD-10-CM | POA: Diagnosis not present

## 2018-11-06 DIAGNOSIS — J449 Chronic obstructive pulmonary disease, unspecified: Secondary | ICD-10-CM | POA: Insufficient documentation

## 2018-11-06 DIAGNOSIS — I255 Ischemic cardiomyopathy: Secondary | ICD-10-CM | POA: Insufficient documentation

## 2018-11-06 DIAGNOSIS — I5022 Chronic systolic (congestive) heart failure: Secondary | ICD-10-CM | POA: Insufficient documentation

## 2018-11-06 DIAGNOSIS — Z8261 Family history of arthritis: Secondary | ICD-10-CM | POA: Insufficient documentation

## 2018-11-06 DIAGNOSIS — Z85118 Personal history of other malignant neoplasm of bronchus and lung: Secondary | ICD-10-CM | POA: Diagnosis not present

## 2018-11-06 DIAGNOSIS — Z79899 Other long term (current) drug therapy: Secondary | ICD-10-CM | POA: Insufficient documentation

## 2018-11-06 DIAGNOSIS — Z801 Family history of malignant neoplasm of trachea, bronchus and lung: Secondary | ICD-10-CM | POA: Diagnosis not present

## 2018-11-06 DIAGNOSIS — I4510 Unspecified right bundle-branch block: Secondary | ICD-10-CM | POA: Diagnosis not present

## 2018-11-06 DIAGNOSIS — Z7902 Long term (current) use of antithrombotics/antiplatelets: Secondary | ICD-10-CM | POA: Diagnosis not present

## 2018-11-06 DIAGNOSIS — J90 Pleural effusion, not elsewhere classified: Secondary | ICD-10-CM | POA: Insufficient documentation

## 2018-11-06 DIAGNOSIS — Z9221 Personal history of antineoplastic chemotherapy: Secondary | ICD-10-CM | POA: Insufficient documentation

## 2018-11-06 DIAGNOSIS — I2510 Atherosclerotic heart disease of native coronary artery without angina pectoris: Secondary | ICD-10-CM | POA: Insufficient documentation

## 2018-11-06 DIAGNOSIS — Z923 Personal history of irradiation: Secondary | ICD-10-CM | POA: Diagnosis not present

## 2018-11-06 DIAGNOSIS — Z951 Presence of aortocoronary bypass graft: Secondary | ICD-10-CM | POA: Diagnosis not present

## 2018-11-06 DIAGNOSIS — Z87891 Personal history of nicotine dependence: Secondary | ICD-10-CM | POA: Insufficient documentation

## 2018-11-06 DIAGNOSIS — Z820 Family history of epilepsy and other diseases of the nervous system: Secondary | ICD-10-CM | POA: Insufficient documentation

## 2018-11-06 DIAGNOSIS — Z7982 Long term (current) use of aspirin: Secondary | ICD-10-CM | POA: Diagnosis not present

## 2018-11-06 DIAGNOSIS — E7849 Other hyperlipidemia: Secondary | ICD-10-CM

## 2018-11-06 DIAGNOSIS — N182 Chronic kidney disease, stage 2 (mild): Secondary | ICD-10-CM | POA: Insufficient documentation

## 2018-11-06 MED ORDER — CARVEDILOL 12.5 MG PO TABS: 18.7500 mg | ORAL_TABLET | Freq: Two times a day (BID) | ORAL | 6 refills | Status: DC

## 2018-11-06 MED ORDER — EPLERENONE 50 MG PO TABS: 50.0000 mg | ORAL_TABLET | Freq: Every day | ORAL | 11 refills | Status: DC

## 2018-11-06 MED ORDER — SACUBITRIL-VALSARTAN 49-51 MG PO TABS: ORAL_TABLET | ORAL | 11 refills | Status: DC

## 2018-11-06 NOTE — Addendum Note (Signed)
Encounter addended by: Kerry Dory, CMA on: 11/06/2018 11:13 AM  Actions taken: Order list changed

## 2018-11-06 NOTE — Patient Instructions (Signed)
INCREASE Coreg to 18.75 mg, one and one half tabs twice daily  Your physician recommends that you schedule a follow-up appointment in: 3 months with Dr Aundra Dubin   Do the following things EVERYDAY: 1) Weigh yourself in the morning before breakfast. Write it down and keep it in a log. 2) Take your medicines as prescribed 3) Eat low salt foods-Limit salt (sodium) to 2000 mg per day.  4) Stay as active as you can everyday 5) Limit all fluids for the day to less than 2 liters

## 2018-11-06 NOTE — Telephone Encounter (Signed)
At the request of the patient Most recent OV and rx send to [facility] for entresto and inspra  Sent to patients care team at [facility] Fax [phone]

## 2018-11-06 NOTE — Addendum Note (Signed)
Encounter addended by: Georgiana Shore, NP on: 11/06/2018 11:25 AM  Actions taken: Clinical Note Signed

## 2018-11-14 ENCOUNTER — Ambulatory Visit: Payer: Medicare Other | Admitting: Vascular Surgery

## 2018-11-14 ENCOUNTER — Telehealth: Payer: Self-pay | Admitting: Pulmonary Disease

## 2018-11-14 ENCOUNTER — Encounter (HOSPITAL_COMMUNITY): Payer: Medicare Other

## 2018-11-14 ENCOUNTER — Other Ambulatory Visit (HOSPITAL_COMMUNITY): Payer: Medicare Other

## 2018-11-14 DIAGNOSIS — R0602 Shortness of breath: Secondary | ICD-10-CM

## 2018-11-14 MED ORDER — BUDESONIDE-FORMOTEROL FUMARATE 160-4.5 MCG/ACT IN AERO: 2.0000 | INHALATION_SPRAY | Freq: Two times a day (BID) | RESPIRATORY_TRACT | 0 refills | Status: DC

## 2018-11-14 MED ORDER — IPRATROPIUM-ALBUTEROL 0.5-2.5 (3) MG/3ML IN SOLN: RESPIRATORY_TRACT | 0 refills | Status: DC

## 2018-11-14 NOTE — Telephone Encounter (Signed)
Returned phone call to patient, made aware refill has been sent. Requesting a prescription for Symbicort be mailed to him. Prescription printed and mailed to patient. Nothing further is needed at this time. Voiced understanding.

## 2018-11-17 ENCOUNTER — Telehealth: Payer: Self-pay | Admitting: Pulmonary Disease

## 2018-11-17 DIAGNOSIS — R0602 Shortness of breath: Secondary | ICD-10-CM

## 2018-11-17 NOTE — Telephone Encounter (Signed)
Called and spoke with patient he stated that he needs another prescription to be printed for is ipratropium nebulizer solution. He is requesting that this is mailed to him. Patient is needing this to take to the [place].   RA please advise if this is ok to do. This was last filled on 11/14/18 Thank you.

## 2018-11-18 ENCOUNTER — Encounter: Payer: Self-pay | Admitting: Pulmonary Disease

## 2018-11-18 ENCOUNTER — Ambulatory Visit: Payer: Medicare Other | Admitting: Pulmonary Disease

## 2018-11-18 MED ORDER — IPRATROPIUM-ALBUTEROL 0.5-2.5 (3) MG/3ML IN SOLN: RESPIRATORY_TRACT | 3 refills | Status: DC

## 2018-11-18 MED ORDER — BUDESONIDE-FORMOTEROL FUMARATE 160-4.5 MCG/ACT IN AERO: 2.0000 | INHALATION_SPRAY | Freq: Two times a day (BID) | RESPIRATORY_TRACT | 3 refills | Status: DC

## 2018-11-18 NOTE — Telephone Encounter (Signed)
Patient called back stating that the scripts for symbicort 160 and Duoneb needs printed, and a letter sent stating why the patient is on and in con't need of this medication all faxed to [phone] Wrote letter today regarding need of patient's rx refills Printed the scripts for symb 160 and duoneb medications This is all been faxed to [place] [phone]  LVM for patient that this fax has been completed this morning. X1

## 2018-11-18 NOTE — Telephone Encounter (Signed)
Called and spoke with patient regarding his refill rx on Symbicort 160 and Duoneb neb meds They need to be printed for him to go to the [place] Pt states he needs to call back the [place] this morning to see if we are able to fax them in to the pharmacy Or if the patient needs to hand deliver a printed script Awaiting patient's call back today.

## 2018-11-18 NOTE — Telephone Encounter (Signed)
Okay to do so? 

## 2018-11-19 NOTE — Telephone Encounter (Signed)
Spoke with the pt and notified that his letter and rxs were faxed per [facility]  He verbalized understanding  Nothing further needed

## 2018-11-21 ENCOUNTER — Telehealth (HOSPITAL_COMMUNITY): Payer: Self-pay | Admitting: *Deleted

## 2018-11-21 ENCOUNTER — Other Ambulatory Visit (HOSPITAL_COMMUNITY): Payer: Self-pay | Admitting: *Deleted

## 2018-11-21 MED ORDER — EPLERENONE 50 MG PO TABS: 50.0000 mg | ORAL_TABLET | Freq: Every day | ORAL | 11 refills | Status: DC

## 2018-11-21 MED ORDER — SACUBITRIL-VALSARTAN 49-51 MG PO TABS: ORAL_TABLET | ORAL | 11 refills | Status: DC

## 2018-11-21 NOTE — Telephone Encounter (Signed)
Last OV note and scripts for entresto and inspra faxed to [facility] fax # [phone]

## 2018-11-21 NOTE — Telephone Encounter (Signed)
Patient called to request another rx and OV be sent to [facility] with [facility] [place], ph # [phone]. She cannot confirm records were received requested another fax Fax # [phone]

## 2018-11-26 ENCOUNTER — Other Ambulatory Visit: Payer: Self-pay | Admitting: Pulmonary Disease

## 2018-12-10 ENCOUNTER — Other Ambulatory Visit: Payer: Self-pay

## 2018-12-10 ENCOUNTER — Encounter: Payer: Self-pay | Admitting: Family Medicine

## 2018-12-10 ENCOUNTER — Ambulatory Visit (INDEPENDENT_AMBULATORY_CARE_PROVIDER_SITE_OTHER): Payer: Medicare Other | Admitting: Family Medicine

## 2018-12-10 DIAGNOSIS — N183 Chronic kidney disease, stage 3 unspecified: Secondary | ICD-10-CM

## 2018-12-10 DIAGNOSIS — J441 Chronic obstructive pulmonary disease with (acute) exacerbation: Secondary | ICD-10-CM

## 2018-12-10 DIAGNOSIS — E7800 Pure hypercholesterolemia, unspecified: Secondary | ICD-10-CM

## 2018-12-10 DIAGNOSIS — I10 Essential (primary) hypertension: Secondary | ICD-10-CM

## 2018-12-10 MED ORDER — OMEPRAZOLE 20 MG PO CPDR: 20.0000 mg | DELAYED_RELEASE_CAPSULE | Freq: Every day | ORAL | 3 refills | Status: DC

## 2018-12-10 NOTE — Progress Notes (Signed)
   Subjective:    Patient ID: Paul Ewing., male    DOB: [birth year], 78 y.o.   MRN: [id number] Telephone only [facility] therefore reg codes Hyperlipidemia  This is a chronic problem. The current episode started more than 1 year ago. Treatments tried: simvastatin 80mg  one half daily. There are no compliance problems (takes meds every day, does yard work for exercise,).    Refill on omeprazole.   Pt states no concerns today.   Virtual Visit via Telephone Note  I connected with Paul Ewing. on 12/10/18 at  9:00 AM EDT by telephone and verified that I am speaking with the correct person using two identifiers.   I discussed the limitations, risks, security and privacy concerns of performing an evaluation and management service by telephone and the availability of in person appointments. I also discussed with the patient that there may be a patient responsible charge related to this service. The patient expressed understanding and agreed to proceed.  Blood pressure medicine and blood pressure levels reviewed today with patient. Compliant with blood pressure medicine. States does not miss a dose. No obvious side effects. Blood pressure generally good when checked elsewhere. Watching salt intake.   Patient continues to take lipid medication regularly. No obvious side effects from it. Generally does not miss a dose. Prior blood work results are reviewed with patient. Patient continues to work on fat intake in diet   History of Present Illness:    Observations/Objective:   Assessment and Plan:   Follow Up Instructions:    I discussed the assessment and treatment plan with the patient. The patient was provided an opportunity to ask questions and all were answered. The patient agreed with the plan and demonstrated an understanding of the instructions.   The patient was advised to call back or seek an in-person evaluation if the symptoms worsen or if the condition fails to improve as  anticipated.  I provided 25 minutes of non-face-to-face time during this encounter.   Dayton Bailiff, LPN    Review of Systems No headache, no major weight loss or weight gain, no chest pain no back pain abdominal pain no change in bowel habits complete ROS otherwise negative     Objective:   Physical Exam   Virtual visit impression    Assessment & Plan:  Impression hypertension good control discussed to maintain same meds  2.  Hyperlipidemia prior blood work reviewed to maintain same meds  3.  Coronary artery disease clinically stable  4.  Lung carcinoma in the midst of treatment.  5.  Asthma clinically stable number next #6 reflux clinically stable needs refill on meds  Coronavirus avoidance discussed diet exercise discussed follow-up in 6 months for wellness plus chronic

## 2018-12-11 ENCOUNTER — Inpatient Hospital Stay: Payer: Medicare Other | Attending: Internal Medicine

## 2018-12-11 ENCOUNTER — Ambulatory Visit (HOSPITAL_COMMUNITY)
Admission: RE | Admit: 2018-12-11 | Discharge: 2018-12-11 | Disposition: A | Discharge status: Home / Self Care | Payer: Medicare Other | Source: Ambulatory Visit | Attending: Internal Medicine | Admitting: Internal Medicine

## 2018-12-11 ENCOUNTER — Other Ambulatory Visit: Payer: Self-pay

## 2018-12-11 DIAGNOSIS — I255 Ischemic cardiomyopathy: Secondary | ICD-10-CM | POA: Diagnosis not present

## 2018-12-11 DIAGNOSIS — Z85118 Personal history of other malignant neoplasm of bronchus and lung: Secondary | ICD-10-CM | POA: Diagnosis not present

## 2018-12-11 DIAGNOSIS — Z923 Personal history of irradiation: Secondary | ICD-10-CM | POA: Diagnosis not present

## 2018-12-11 DIAGNOSIS — K219 Gastro-esophageal reflux disease without esophagitis: Secondary | ICD-10-CM | POA: Insufficient documentation

## 2018-12-11 DIAGNOSIS — I2510 Atherosclerotic heart disease of native coronary artery without angina pectoris: Secondary | ICD-10-CM | POA: Diagnosis not present

## 2018-12-11 DIAGNOSIS — Z96642 Presence of left artificial hip joint: Secondary | ICD-10-CM | POA: Diagnosis not present

## 2018-12-11 DIAGNOSIS — I129 Hypertensive chronic kidney disease with stage 1 through stage 4 chronic kidney disease, or unspecified chronic kidney disease: Secondary | ICD-10-CM | POA: Insufficient documentation

## 2018-12-11 DIAGNOSIS — Z9221 Personal history of antineoplastic chemotherapy: Secondary | ICD-10-CM | POA: Insufficient documentation

## 2018-12-11 DIAGNOSIS — C3490 Malignant neoplasm of unspecified part of unspecified bronchus or lung: Secondary | ICD-10-CM

## 2018-12-11 DIAGNOSIS — Z951 Presence of aortocoronary bypass graft: Secondary | ICD-10-CM | POA: Diagnosis not present

## 2018-12-11 DIAGNOSIS — I252 Old myocardial infarction: Secondary | ICD-10-CM | POA: Insufficient documentation

## 2018-12-11 DIAGNOSIS — E785 Hyperlipidemia, unspecified: Secondary | ICD-10-CM | POA: Insufficient documentation

## 2018-12-11 DIAGNOSIS — N189 Chronic kidney disease, unspecified: Secondary | ICD-10-CM | POA: Diagnosis not present

## 2018-12-11 DIAGNOSIS — Z87891 Personal history of nicotine dependence: Secondary | ICD-10-CM | POA: Insufficient documentation

## 2018-12-11 DIAGNOSIS — Z79899 Other long term (current) drug therapy: Secondary | ICD-10-CM | POA: Insufficient documentation

## 2018-12-11 DIAGNOSIS — J449 Chronic obstructive pulmonary disease, unspecified: Secondary | ICD-10-CM | POA: Insufficient documentation

## 2018-12-11 DIAGNOSIS — R05 Cough: Secondary | ICD-10-CM | POA: Diagnosis not present

## 2018-12-11 DIAGNOSIS — Z7901 Long term (current) use of anticoagulants: Secondary | ICD-10-CM | POA: Diagnosis not present

## 2018-12-11 DIAGNOSIS — R0989 Other specified symptoms and signs involving the circulatory and respiratory systems: Secondary | ICD-10-CM | POA: Diagnosis not present

## 2018-12-11 LAB — CMP (CANCER CENTER ONLY)
ALT: 13 U/L (ref 0–44)
AST: 19 U/L (ref 15–41)
Albumin: 3.9 g/dL (ref 3.5–5.0)
Alkaline Phosphatase: 68 U/L (ref 38–126)
Anion gap: 13 (ref 5–15)
BUN: 26 mg/dL — ABNORMAL HIGH (ref 8–23)
CO2: 26 mmol/L (ref 22–32)
Calcium: 9.4 mg/dL (ref 8.9–10.3)
Chloride: 100 mmol/L (ref 98–111)
Creatinine: 1.79 mg/dL — ABNORMAL HIGH (ref 0.61–1.24)
GFR, Est AFR Am: 41 mL/min — ABNORMAL LOW (ref >60)
GFR, Estimated: 35 mL/min — ABNORMAL LOW (ref >60)
Glucose, Bld: 93 mg/dL (ref 70–99)
Potassium: 5 mmol/L (ref 3.5–5.1)
Sodium: 139 mmol/L (ref 135–145)
Total Bilirubin: 0.6 mg/dL (ref 0.3–1.2)
Total Protein: 7.5 g/dL (ref 6.5–8.1)

## 2018-12-11 LAB — CBC WITH DIFFERENTIAL (CANCER CENTER ONLY)
Abs Immature Granulocytes: 0.06 10*3/uL (ref 0.00–0.07)
Basophils Absolute: 0 10*3/uL (ref 0.0–0.1)
Basophils Relative: 0 %
Eosinophils Absolute: 0.1 10*3/uL (ref 0.0–0.5)
Eosinophils Relative: 1 %
HCT: 39.9 % (ref 39.0–52.0)
Hemoglobin: 12.8 g/dL — ABNORMAL LOW (ref 13.0–17.0)
Immature Granulocytes: 1 %
Lymphocytes Relative: 9 %
Lymphs Abs: 0.9 10*3/uL (ref 0.7–4.0)
MCH: 30 pg (ref 26.0–34.0)
MCHC: 32.1 g/dL (ref 30.0–36.0)
MCV: 93.7 fL (ref 80.0–100.0)
Monocytes Absolute: 0.9 10*3/uL (ref 0.1–1.0)
Monocytes Relative: 9 %
Neutro Abs: 8.1 10*3/uL — ABNORMAL HIGH (ref 1.7–7.7)
Neutrophils Relative %: 80 %
Platelet Count: 125 10*3/uL — ABNORMAL LOW (ref 150–400)
RBC: 4.26 MIL/uL (ref 4.22–5.81)
RDW: 14.2 % (ref 11.5–15.5)
WBC Count: 10 10*3/uL (ref 4.0–10.5)
nRBC: 0 % (ref 0.0–0.2)

## 2018-12-11 MED ORDER — SODIUM CHLORIDE (PF) 0.9 % IJ SOLN
INTRAMUSCULAR | Status: AC
  Filled 2018-12-11: qty 50

## 2018-12-11 MED ORDER — IOHEXOL 300 MG/ML  SOLN
75.0000 mL | Freq: Once | INTRAMUSCULAR | Status: AC | PRN
  Administered 2018-12-11: 50 mL via INTRAVENOUS

## 2018-12-12 ENCOUNTER — Ambulatory Visit: Payer: Medicare Other | Admitting: Vascular Surgery

## 2018-12-12 ENCOUNTER — Other Ambulatory Visit (HOSPITAL_COMMUNITY): Payer: Medicare Other

## 2018-12-12 ENCOUNTER — Encounter (HOSPITAL_COMMUNITY): Payer: Medicare Other

## 2018-12-15 ENCOUNTER — Telehealth: Payer: Self-pay | Admitting: Internal Medicine

## 2018-12-15 NOTE — Telephone Encounter (Signed)
Spoke with patient re moving 4/23 webex to 4/22 as telephone visit. Patient does not have webex capability.

## 2018-12-16 ENCOUNTER — Ambulatory Visit (HOSPITAL_COMMUNITY): Admission: RE | Admit: 2018-12-16 | Payer: Medicare Other | Source: Ambulatory Visit

## 2018-12-16 ENCOUNTER — Other Ambulatory Visit: Payer: Medicare Other

## 2018-12-17 ENCOUNTER — Telehealth: Payer: Self-pay | Admitting: *Deleted

## 2018-12-17 ENCOUNTER — Inpatient Hospital Stay (HOSPITAL_BASED_OUTPATIENT_CLINIC_OR_DEPARTMENT_OTHER): Payer: Medicare Other | Admitting: Internal Medicine

## 2018-12-17 ENCOUNTER — Encounter: Payer: Self-pay | Admitting: Internal Medicine

## 2018-12-17 DIAGNOSIS — Z9221 Personal history of antineoplastic chemotherapy: Secondary | ICD-10-CM

## 2018-12-17 DIAGNOSIS — Z923 Personal history of irradiation: Secondary | ICD-10-CM

## 2018-12-17 DIAGNOSIS — C3411 Malignant neoplasm of upper lobe, right bronchus or lung: Secondary | ICD-10-CM | POA: Diagnosis not present

## 2018-12-17 DIAGNOSIS — C3490 Malignant neoplasm of unspecified part of unspecified bronchus or lung: Secondary | ICD-10-CM

## 2018-12-17 NOTE — Progress Notes (Signed)
[place] [phone]   [phone]  PROGRESS NOTE FOR TELEMEDICINE VISITS  Paul Kirschner, MD [place] [id number]  I connected with@ on 12/17/18 at 10:15 AM EDT by telephone visit and verified that I am speaking with the correct person using two identifiers.   I discussed the limitations, risks, security and privacy concerns of performing an evaluation and management service by telemedicine and the availability of in-person appointments. I also discussed with the patient that there may be a patient responsible charge related to this service. The patient expressed understanding and agreed to proceed.  Other persons participating in the visit and their role in the encounter:  None  Patient's location:  Home Provider's location:  [place]  DIAGNOSIS: Stage IIIA (T2b, N2, M0) non-small cell lung cancer, favoring squamous cell carcinoma presented with right upper lobe lung mass in addition to mediastinal lymphadenopathy diagnosed in March 2017.  PRIOR THERAPY:  A course of concurrent chemoradiation with weekly carboplatin for AUC of 2 and paclitaxel 45 MG/M2. Status post 5 cycle with partial response.  CURRENT THERAPY: Observation  INTERVAL HISTORY: Paul Sandoval. 78 y.o. male has a telephone virtual visit with me today for evaluation and discussion of his scan results.  The patient is feeling fine today with no concerning complaints.  He is very active and works at home and backyard.  He denied having any recent chest pain but has shortness of breath with exertion with no cough or hemoptysis.  He denied having any fever or chills.  He has no nausea, vomiting, diarrhea or constipation.  He denied having any headache or visual changes.  The patient had repeat CT scan of the chest performed recently and we are having the visit for evaluation and discussion of his scan results.  MEDICAL HISTORY: Past  Medical History:  Diagnosis Date   AAA (abdominal aortic aneurysm) ([facility]) 2010   4.4 cm 08/2008;4.44 in 7/10 and 4.65 in 08/2009; 4.8 by CT in 11/2009; 4.3 by ultrasound in 08/2010   Anemia    Arteriosclerotic cardiovascular disease (ASCVD) 1996   CABG-1996   Arthritis    "fingers" (03/18/2014)   CAD (coronary artery disease)    03/18/14:  PCI with DES to distal left main. 7/29: DES to the SVG to Diag   Cancer [facility])    Upper right lobe lung cancer   Cardiomyopathy, ischemic    Echo 03/17/14: EF 45-50%   Chronic bronchitis (HCC)    Chronic kidney disease    CRF   Chronic rhinitis    Colonic polyp 2002   polypectomy in 2002   COPD (chronic obstructive pulmonary disease) ([place])    Diverticulosis    Dyspnea    with exertion   ED (erectile dysfunction)    Encounter for antineoplastic chemotherapy 12/19/2015   GERD (gastroesophageal reflux disease)    History of blood transfusion    Hyperlipidemia    Hypertension    IFG (impaired fasting glucose)    Myocardial infarction [facility])    "told h/o silent MI sometime before 1996"   Pneumonia ~ 2001; ~ 2005    has had more than twice   Right bundle branch block    Tobacco abuse, in remission    40 pack year total consumption; discontinued in 1996    ALLERGIES:  is allergic to neomycin.  MEDICATIONS:  Current Outpatient Medications  Medication Sig Dispense Refill   acetaminophen (TYLENOL) 500 MG tablet Take  500 mg by mouth every 6 (six) hours as needed for mild pain.     aspirin EC 81 MG tablet Take 81 mg by mouth daily before breakfast.      budesonide-formoterol (SYMBICORT) 160-4.5 MCG/ACT inhaler Inhale 2 puffs into the lungs 2 (two) times daily. 3 Inhaler 3   carvedilol (COREG) 12.5 MG tablet Take 1.5 tablets (18.75 mg total) by mouth 2 (two) times daily with a meal. 90 tablet 6   clopidogrel (PLAVIX) 75 MG tablet TAKE (1) TABLET BY MOUTH ONCE DAILY. 30 tablet 5   eplerenone (INSPRA) 50 MG tablet Take 1  tablet (50 mg total) by mouth daily. 30 tablet 11   Ferrous Sulfate (IRON) 28 MG TABS Take 65 mg by mouth daily.      furosemide (LASIX) 40 MG tablet Take 1 tablet (40 mg total) by mouth daily. 30 tablet 6   GuaiFENesin (MUCUS RELIEF ADULT PO) Take 2 capsules by mouth 3 (three) times daily.     ipratropium-albuterol (DUONEB) 0.5-2.5 (3) MG/3ML SOLN INHALE 1 VIAL VIA NEBULIZER 4 TIMES DAILY. 360 mL 3   methylPREDNISolone (MEDROL DOSEPAK) 4 MG TBPK tablet TAKE 1 TABLET BY MOUTH EVERY OTHER DAY. 42 tablet 0   methylPREDNISolone (MEDROL) 4 MG tablet Take 4 mg by mouth every other day.     Multiple Vitamins-Minerals (CENTRUM SILVER ADULT 50+) TABS Take 1 tablet by mouth daily with lunch.      nitroGLYCERIN (NITROSTAT) 0.4 MG SL tablet PLACE 1 TAB UNDER TONGUE EVERY 5 MIN IF NEEDED FOR CHEST PAIN. MAY USE 3 TIMES.NO RELIEF CALL 911. 25 tablet 1   omeprazole (PRILOSEC) 20 MG capsule Take 1 capsule (20 mg total) by mouth daily. 90 capsule 3   PROAIR HFA 108 (90 Base) MCG/ACT inhaler INHALE 2 PUFFS BY MOUTH EVERY 4 TO 6 HOURS AS NEEDED FOR WHEEZING. 8.5 g 0   Probiotic Product (FLORAJEN3 PO) Take 1 tablet by mouth daily.     sacubitril-valsartan (ENTRESTO) 49-51 MG TAKE (1) TABLET BY MOUTH TWICE DAILY. 60 tablet 11   simvastatin (ZOCOR) 80 MG tablet Take 0.5 tablets (40 mg total) by mouth at bedtime. 15 tablet 5   No current facility-administered medications for this visit.     SURGICAL HISTORY:  Past Surgical History:  Procedure Laterality Date   ABDOMINAL AORTIC ANEURYSM REPAIR  11/2012   ABDOMINAL AORTIC ENDOVASCULAR STENT GRAFT N/A 12/11/2012   Procedure: ABDOMINAL AORTIC ENDOVASCULAR STENT GRAFT;  Surgeon: Mal Misty, MD;  Location: [facility];  Service: Vascular;  Laterality: N/A;  Ultrasound guided; [place]  01/08/1995   COLONOSCOPY  2002   polypectomy-patient denies   CORONARY ANGIOPLASTY WITH STENT PLACEMENT  03/18/2014   "1"   CORONARY ANGIOPLASTY  WITH STENT PLACEMENT  03/24/2014   "1"   CORONARY ARTERY BYPASS GRAFT  01/09/1995   "CABG X3"   FLEXIBLE BRONCHOSCOPY N/A 03/01/2017   Procedure: FLEXIBLE BRONCHOSCOPY WITH BIOPSIES;  Surgeon: Gaye Pollack, MD;  Location: [facility];  Service: Thoracic;  Laterality: N/A;   JOINT REPLACEMENT     LAPAROSCOPIC CHOLECYSTECTOMY  12/2009   LEFT AND RIGHT HEART CATHETERIZATION WITH CORONARY/GRAFT ANGIOGRAM N/A 03/18/2014   Procedure: LEFT AND RIGHT HEART CATHETERIZATION WITH Beatrix Fetters;  Surgeon: Blane Ohara, MD;  Location: [facility] CATH LAB;  Service: Cardiovascular;  Laterality: N/A;   PERCUTANEOUS CORONARY STENT INTERVENTION (PCI-S)  03/18/2014   Procedure: PERCUTANEOUS CORONARY STENT INTERVENTION (PCI-S);  Surgeon: Blane Ohara, MD;  Location: [facility] CATH LAB;  Service: Cardiovascular;;   PERCUTANEOUS CORONARY STENT INTERVENTION (PCI-S) N/A 03/24/2014   Procedure: PERCUTANEOUS CORONARY STENT INTERVENTION (PCI-S);  Surgeon: Blane Ohara, MD;  Location: [facility] CATH LAB;  Service: Cardiovascular;  Laterality: N/A;   PLEURAL EFFUSION DRAINAGE Right 03/01/2017   Procedure: DRAINAGE OF PLEURAL EFFUSION;  Surgeon: Gaye Pollack, MD;  Location: [facility];  Service: Thoracic;  Laterality: Right;   RIGHT/LEFT HEART CATH AND CORONARY/GRAFT ANGIOGRAPHY N/A 05/15/2017   Procedure: RIGHT/LEFT HEART CATH AND CORONARY/GRAFT ANGIOGRAPHY;  Surgeon: Larey Dresser, MD;  Location: [facility] CV LAB;  Service: Cardiovascular;  Laterality: N/A;   TALC PLEURODESIS Right 03/01/2017   Procedure: Pietro Cassis;  Surgeon: Gaye Pollack, MD;  Location: [facility];  Service: Thoracic;  Laterality: Right;   TOTAL HIP ARTHROPLASTY Left 01/21/2013   Procedure: TOTAL HIP ARTHROPLASTY ANTERIOR APPROACH;  Surgeon: Mauri Pole, MD;  Location: [place];  Service: Orthopedics;  Laterality: Left;   VIDEO ASSISTED THORACOSCOPY Right 03/01/2017   Procedure: VIDEO ASSISTED THORACOSCOPY WITH BIOPSIES;  Surgeon: Gaye Pollack,  MD;  Location: [facility];  Service: Thoracic;  Laterality: Right;   VIDEO BRONCHOSCOPY N/A 11/17/2015   Procedure: VIDEO BRONCHOSCOPY WITH FLUORO;  Surgeon: Rigoberto Noel, MD;  Location: [place];  Service: Cardiopulmonary;  Laterality: N/A;    REVIEW OF SYSTEMS:  A comprehensive review of systems was negative except for: Respiratory: positive for dyspnea on exertion   LABORATORY DATA: Lab Results  Component Value Date   WBC 10.0 12/11/2018   HGB 12.8 (L) 12/11/2018   HCT 39.9 12/11/2018   MCV 93.7 12/11/2018   PLT 125 (L) 12/11/2018      Chemistry      Component Value Date/Time   NA 139 12/11/2018 1223   NA 137 05/21/2017 0906   K 5.0 12/11/2018 1223   K 4.1 05/21/2017 0906   CL 100 12/11/2018 1223   CO2 26 12/11/2018 1223   CO2 29 05/21/2017 0906   BUN 26 (H) 12/11/2018 1223   BUN 14.6 05/21/2017 0906   CREATININE 1.79 (H) 12/11/2018 1223   CREATININE 1.2 05/21/2017 0906      Component Value Date/Time   CALCIUM 9.4 12/11/2018 1223   CALCIUM 9.4 05/21/2017 0906   ALKPHOS 68 12/11/2018 1223   ALKPHOS 86 05/21/2017 0906   AST 19 12/11/2018 1223   AST 19 05/21/2017 0906   ALT 13 12/11/2018 1223   ALT 14 05/21/2017 0906   BILITOT 0.6 12/11/2018 1223   BILITOT 0.57 05/21/2017 0906       RADIOGRAPHIC STUDIES: Ct Chest W Contrast  Result Date: 12/12/2018 CLINICAL DATA:  78 year old male with history of non-small cell lung cancer. Status post chemoradiation therapy. Follow-up study. Mild cough and chest congestion. EXAM: CT CHEST WITH CONTRAST TECHNIQUE: Multidetector CT imaging of the chest was performed during intravenous contrast administration. CONTRAST:  42m OMNIPAQUE IOHEXOL 300 MG/ML  SOLN COMPARISON:  Chest CT 06/04/2018. FINDINGS: Cardiovascular: Heart size is normal. There is no significant pericardial fluid, thickening or pericardial calcification. There is aortic atherosclerosis, as well as atherosclerosis of the great vessels of the mediastinum and the  coronary arteries, including calcified atherosclerotic plaque in the left main, left anterior descending, left circumflex and right coronary arteries. Status post median sternotomy for CABG including LIMA to the LAD. Mediastinum/Nodes: No pathologically enlarged mediastinal or hilar lymph nodes. Small hiatal hernia. No axillary lymphadenopathy. Lungs/Pleura: Again noted is extensive mass-like architectural distortion and volume loss in the upper right lung, and to a  lesser extent in the medial aspect of the left upper lobe anteriorly, most compatible with chronic postradiation mass-like fibrosis. No new suspicious appearing pulmonary nodules or masses are noted. No acute consolidative airspace disease. No left pleural effusion. Chronic multilocular thick-walled right pleural effusion, stable compared to the prior examination. Upper Abdomen: Aortic atherosclerosis. Aortic stent graft incompletely imaged. Status post cholecystectomy. Musculoskeletal: Median sternotomy wires. Chronic T4 compression fracture with 30% loss of anterior vertebral body height, unchanged. There are no aggressive appearing lytic or blastic lesions noted in the visualized portions of the skeleton. IMPRESSION: 1. 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 No definite findings to suggest local recurrence of disease or metastatic disease in the thorax. 2. Aortic atherosclerosis, in addition to left main and 3 vessel coronary artery disease. Status post median sternotomy for CABG including LIMA to the LAD. Aortic Atherosclerosis (ICD10-I70.0). Electronically Signed   By: Vinnie Langton M.D.   On: 12/12/2018 08:38    ASSESSMENT AND PLAN: This is a very pleasant 78 years old white male with a stage IIIa non-small cell lung cancer status post a course of concurrent chemoradiation with weekly carboplatin and paclitaxel and he had a rough  time with the treatment at that time. He did not receive consolidation chemotherapy. The patient has been on observation since that time and he is feeling fine. He had repeat CT scan of the chest performed recently.  I personally and independently reviewed the scans and discussed the results with the patient. His scan showed no concerning findings for disease recurrence or progression. I recommended for the patient to continue on observation with repeat CT scan of the chest in 6 months. Regarding the coronary artery disease and atherosclerosis, he is followed by Dr. Aundra Dubin his cardiologist. The patient was advised to call immediately if he has any concerning symptoms in the interval. I discussed the assessment and treatment plan with the patient. The patient was provided an opportunity to ask questions and all were answered. The patient agreed with the plan and demonstrated an understanding of the instructions.   The patient was advised to call back or seek an in-person evaluation if the symptoms worsen or if the condition fails to improve as anticipated.  I provided 11 minutes of non face-to-face telephone visit time during this encounter, and > 50% was spent counseling as documented under my assessment & plan.  Eilleen Kempf, MD 12/17/2018 10:29 AM  Disclaimer: This note was dictated with voice recognition software. Similar sounding words can inadvertently be transcribed and may not be corrected upon review.

## 2018-12-17 NOTE — Telephone Encounter (Signed)
Oncology Nurse Navigator Documentation  Oncology Nurse Navigator Flowsheets 12/17/2018  Navigator Location [facility]  Referral date to RadOnc/MedOnc -  Navigator Encounter Type Telephone/I received a vm message from patient.  I called him back but was unable to reach him.  I did leave a vm message for him to call me with my name and phone number.  Telephone Incoming Call;Outgoing Call  Abnormal Finding Date -  Confirmed Diagnosis Date -  Patient Visit Type -  Treatment Phase Follow-up  Barriers/Navigation Needs Education  Education Other  Interventions Education  Coordination of Care -  Education Method Verbal  Acuity Level 1  Acuity Level 1 -  Acuity Level 2 -  Time Spent with Patient 15

## 2018-12-18 ENCOUNTER — Telehealth: Payer: Self-pay | Admitting: Family Medicine

## 2018-12-18 ENCOUNTER — Ambulatory Visit: Payer: Medicare Other | Admitting: Internal Medicine

## 2018-12-18 NOTE — Telephone Encounter (Signed)
Pt contacted and informed Let pt know  I reviewed his notes and bw and specialty management, his renl function has been fairly stable until this partic test. He is now on a higher dose of lsix from the cardiology specialist because of his chf, this can contribute to the kid function worsening, they have been keeping a close watch apporopriately. The next step is to conider adjusting his chf meds. He will need to contact his heart docs regarding that. Let him now we have documented this all in this hone message which an be found in epic. Pt states he has called his cardiologist but is waiting on a return call. Pt states his cancer doctor wanted him to inform his PCP. Pt verbalized understanding.

## 2018-12-18 NOTE — Telephone Encounter (Signed)
Let pt know  I reviewed his notes and bw and specialty management, his renl function has been fairly stable until this partic test. He is now on a higher dose of lsix from the cardiology specialist because of his chf, this can contribute to the kid function worsening, they have been keeping a close watch apporopriately. The next step is to conider adjusting his chf meds. He will need to contact his heart docs regarding that. Let him now we have documented this all in this hone message which an be found in epic

## 2018-12-18 NOTE — Telephone Encounter (Signed)
Pt went and saw cancer doc yesterday and was told to let Dr. Richardson Landry know about some abnormal labs. Labs are in epic but he wanted two of them hi lighted. His creatine has went from 1.46 to 1.79 in 6 months. Also his potassium has went from 4.2 to 5 in the last 6 months.   He would like to know if Dr. Richardson Landry feels he needs to do anything special with these changes.

## 2018-12-19 ENCOUNTER — Telehealth (HOSPITAL_COMMUNITY): Payer: Self-pay | Admitting: *Deleted

## 2018-12-19 DIAGNOSIS — I5022 Chronic systolic (congestive) heart failure: Secondary | ICD-10-CM

## 2018-12-19 NOTE — Telephone Encounter (Signed)
I spoke with Dr.McLean. Per Dr.McLean no medication changes at this time have labs drawn next week. I called pt back and spoke with his wife she is aware and agreeable with plan labs scheduled for next Wednesday.

## 2018-12-19 NOTE — Telephone Encounter (Signed)
Pt called to report recent labs he had drawn at the cancer center. They were concerned that his creatinine was elevated and wanted to know if we wanted to adjust medications. Pt is anxious and would like a phone call before the end of the day. Message routed to [place] for advice.  Creatinine on 4/16- 1.79 Creatinine 89m ago - 1.46

## 2018-12-23 DIAGNOSIS — J449 Chronic obstructive pulmonary disease, unspecified: Secondary | ICD-10-CM | POA: Diagnosis not present

## 2018-12-24 ENCOUNTER — Other Ambulatory Visit: Payer: Self-pay

## 2018-12-24 ENCOUNTER — Ambulatory Visit (HOSPITAL_COMMUNITY)
Admission: RE | Admit: 2018-12-24 | Discharge: 2018-12-24 | Disposition: A | Discharge status: Home / Self Care | Payer: Medicare Other | Source: Ambulatory Visit | Attending: Cardiology | Admitting: Cardiology

## 2018-12-24 DIAGNOSIS — I5022 Chronic systolic (congestive) heart failure: Secondary | ICD-10-CM | POA: Diagnosis not present

## 2018-12-24 LAB — BASIC METABOLIC PANEL
Anion gap: 10 (ref 5–15)
BUN: 21 mg/dL (ref 8–23)
CO2: 27 mmol/L (ref 22–32)
Calcium: 9.1 mg/dL (ref 8.9–10.3)
Chloride: 103 mmol/L (ref 98–111)
Creatinine, Ser: 1.77 mg/dL — ABNORMAL HIGH (ref 0.61–1.24)
GFR calc Af Amer: 41 mL/min — ABNORMAL LOW (ref >60)
GFR calc non Af Amer: 36 mL/min — ABNORMAL LOW (ref >60)
Glucose, Bld: 100 mg/dL — ABNORMAL HIGH (ref 70–99)
Potassium: 3.8 mmol/L (ref 3.5–5.1)
Sodium: 140 mmol/L (ref 135–145)

## 2019-01-07 ENCOUNTER — Telehealth: Payer: Self-pay | Admitting: Pulmonary Disease

## 2019-01-07 DIAGNOSIS — J449 Chronic obstructive pulmonary disease, unspecified: Secondary | ICD-10-CM

## 2019-01-07 NOTE — Telephone Encounter (Signed)
Pt is a former Dr. Lenna Gilford pt. There is a recall in there for pt to be scheduled an appt with Dr. Elsworth Soho. Pt previously saw Dr. Elsworth Soho 02/01/16 prior to seeing Dr. Lenna Gilford.  Dr. Elsworth Soho, please advise if you would be okay with [place] ordering a portable neb machine for pt. Thanks!

## 2019-01-08 NOTE — Telephone Encounter (Signed)
Okay to order?

## 2019-01-08 NOTE — Telephone Encounter (Signed)
Spoke with patient. He is aware that [facility] has approved the order. Will go ahead and place the order to [place].   Nothing further needed at time of call.

## 2019-01-25 DIAGNOSIS — J449 Chronic obstructive pulmonary disease, unspecified: Secondary | ICD-10-CM | POA: Diagnosis not present

## 2019-01-30 ENCOUNTER — Other Ambulatory Visit (HOSPITAL_COMMUNITY): Payer: Self-pay | Admitting: Cardiology

## 2019-02-09 DIAGNOSIS — H2513 Age-related nuclear cataract, bilateral: Secondary | ICD-10-CM | POA: Diagnosis not present

## 2019-02-13 ENCOUNTER — Other Ambulatory Visit (HOSPITAL_COMMUNITY): Payer: Self-pay | Admitting: Adult Health Nurse Practitioner

## 2019-02-13 ENCOUNTER — Other Ambulatory Visit: Payer: Self-pay | Admitting: Adult Health Nurse Practitioner

## 2019-02-13 DIAGNOSIS — Z0189 Encounter for other specified special examinations: Secondary | ICD-10-CM

## 2019-02-18 ENCOUNTER — Encounter (HOSPITAL_COMMUNITY): Payer: Medicare Other | Admitting: Cardiology

## 2019-02-23 ENCOUNTER — Ambulatory Visit (HOSPITAL_COMMUNITY)
Admission: RE | Admit: 2019-02-23 | Discharge: 2019-02-23 | Disposition: A | Discharge status: Home / Self Care | Payer: No Typology Code available for payment source | Source: Ambulatory Visit | Attending: Adult Health Nurse Practitioner | Admitting: Adult Health Nurse Practitioner

## 2019-02-23 ENCOUNTER — Other Ambulatory Visit: Payer: Self-pay

## 2019-02-23 DIAGNOSIS — Z0189 Encounter for other specified special examinations: Secondary | ICD-10-CM | POA: Diagnosis not present

## 2019-02-25 DIAGNOSIS — J449 Chronic obstructive pulmonary disease, unspecified: Secondary | ICD-10-CM | POA: Diagnosis not present

## 2019-03-06 ENCOUNTER — Other Ambulatory Visit: Payer: Self-pay | Admitting: Pulmonary Disease

## 2019-03-06 ENCOUNTER — Telehealth: Payer: Self-pay | Admitting: *Deleted

## 2019-03-06 ENCOUNTER — Telehealth (HOSPITAL_COMMUNITY): Payer: Self-pay

## 2019-03-06 ENCOUNTER — Other Ambulatory Visit: Payer: Self-pay

## 2019-03-06 DIAGNOSIS — I714 Abdominal aortic aneurysm, without rupture, unspecified: Secondary | ICD-10-CM

## 2019-03-06 DIAGNOSIS — I6523 Occlusion and stenosis of bilateral carotid arteries: Secondary | ICD-10-CM

## 2019-03-06 NOTE — Telephone Encounter (Signed)
The above patient or their representative was contacted and gave the following answers to these questions:         Do you have any of the following symptoms? No  Fever                    Cough                   Shortness of breath  Do  you have any of the following other symptoms? No    muscle pain         vomiting,        diarrhea        rash         weakness        red eye        abdominal pain         bruising          bruising or bleeding              joint pain           severe headache    Have you been in contact with someone who was or has been sick in the past 2 weeks? No  Yes                 Unsure                         Unable to assess   Does the person that you were in contact with have any of the following symptoms?   Cough         shortness of breath           muscle pain         vomiting,            diarrhea            rash            weakness           fever            red eye           abdominal pain           bruising  or  bleeding                joint pain                severe headache               Have you  or someone you have been in contact with traveled internationally in th last month?  no        If yes, which countries?   Have you  or someone you have been in contact with traveled outside [place] in th last month? no       If yes, which state and city?   COMMENTS OR ACTION PLAN FOR THIS PATIENT:

## 2019-03-06 NOTE — Telephone Encounter (Signed)
Virtual Visit Pre-Appointment Phone Call  Today, I spoke with Paul Sandoval. and performed the following actions:  1. I explained that we are currently trying to limit exposure to the COVID-19 virus by seeing patients at home rather than in the office.  I explained that the visits are best done by video, but can be done by telephone.  I asked the patient if a virtual visit that the patient would like to try instead of coming into the office. Paul Sandoval. agreed to proceed with the phone visit scheduled with Vinnie Level Nickel on 03/11/19.       2. I confirmed the BEST phone number to call the day of the visit and- I included this in appointment notes.  3. I asked if the patient had access to (through a family member/friend) a smartphone with video capability to be used for his visit?"  The patient said yes -    4. I confirmed consent by  a. sending through [place] or by email the [place] as written at the end of this message or  b. verbally as listed below. i. This visit is being performed in the setting of COVID-19. ii. All virtual visits are billed to your insurance company just like a normal visit would be.   iii. We'd like you to understand that the technology does not allow for your provider to perform an examination, and thus may limit your provider's ability to fully assess your condition.  iv. If your provider identifies any concerns that need to be evaluated in person, we will make arrangements to do so.   v. Finally, though the technology is pretty good, we cannot assure that it will always work on either your or our end, and in the setting of a video visit, we may have to convert it to a phone-only visit.  In either situation, we cannot ensure that we have a secure connection.   vi. Are you willing to proceed?"  STAFF: Did the patient verbally acknowledge consent to telehealth visit? Document YES/NO here: YES  2. I advised the  patient to be prepared - I asked that the patient, on the day of his visit, record any information possible with the equipment at his home, such as blood pressure, pulse, oxygen saturation, and your weight and write them all down. I asked the patient to have a pen and paper handy nearby the day of the visit as well.  3. If the patient was scheduled for a video visit, I informed the patient that the visit with the doctor would start with a text to the smartphone # given to [place] by the patient.         If the patient was scheduled for a telephone call, I informed the patient that the visit with the doctor would start with a call to the telephone # given to [place] by the patient.  4. I Informed patient they will receive a phone call 15 minutes prior to their appointment time from a [place] or nurse to review medications, allergies, etc. to prepare for the visit.    TELEPHONE CALL NOTE  Paul Kundrat. has been deemed a candidate for a follow-up tele-health visit to limit community exposure during the Covid-19 pandemic. I spoke with the patient via phone to ensure availability of phone/video source, confirm preferred email & phone number, and discuss instructions and expectations.  I reminded Paul Sandoval. to be  prepared with any vital sign and/or heart rhythm information that could potentially be obtained via home monitoring, at the time of his visit. I reminded Paul Sandoval. to expect a phone call prior to his visit.  Cleaster Corin, NT 03/06/2019 2:13 PM     FULL LENGTH CONSENT FOR TELE-HEALTH VISIT   I hereby voluntarily request, consent and authorize [facility] and its employed or contracted physicians, physician assistants, nurse practitioners or other licensed health care professionals (the Practitioner), to provide me with telemedicine health care services (the "Services") as deemed necessary by the treating Practitioner. I acknowledge and consent to receive the Services by  the Practitioner via telemedicine. I understand that the telemedicine visit will involve communicating with the Practitioner through live audiovisual communication technology and the disclosure of certain medical information by electronic transmission. I acknowledge that I have been given the opportunity to request an in-person assessment or other available alternative prior to the telemedicine visit and am voluntarily participating in the telemedicine visit.  I understand that I have the right to withhold or withdraw my consent to the use of telemedicine in the course of my care at any time, without affecting my right to future care or treatment, and that the Practitioner or I may terminate the telemedicine visit at any time. I understand that I have the right to inspect all information obtained and/or recorded in the course of the telemedicine visit and may receive copies of available information for a reasonable fee.  I understand that some of the potential risks of receiving the Services via telemedicine include:  Marland Kitchen Delay or interruption in medical evaluation due to technological equipment failure or disruption; . Information transmitted may not be sufficient (e.g. poor resolution of images) to allow for appropriate medical decision making by the Practitioner; and/or  . In rare instances, security protocols could fail, causing a breach of personal health information.  Furthermore, I acknowledge that it is my responsibility to provide information about my medical history, conditions and care that is complete and accurate to the best of my ability. I acknowledge that Practitioner's advice, recommendations, and/or decision may be based on factors not within their control, such as incomplete or inaccurate data provided by me or distortions of diagnostic images or specimens that may result from electronic transmissions. I understand that the practice of medicine is not an exact science and that Practitioner  makes no warranties or guarantees regarding treatment outcomes. I acknowledge that I will receive a copy of this consent concurrently upon execution via email to the email address I last provided but may also request a printed copy by calling the office of [place].    I understand that my insurance will be billed for this visit.   I have read or had this consent read to me. . I understand the contents of this consent, which adequately explains the benefits and risks of the Services being provided via telemedicine.  . I have been provided ample opportunity to ask questions regarding this consent and the Services and have had my questions answered to my satisfaction. . I give my informed consent for the services to be provided through the use of telemedicine in my medical care  By participating in this telemedicine visit I agree to the above.

## 2019-03-09 ENCOUNTER — Ambulatory Visit (INDEPENDENT_AMBULATORY_CARE_PROVIDER_SITE_OTHER)
Admission: RE | Admit: 2019-03-09 | Discharge: 2019-03-09 | Disposition: A | Discharge status: Home / Self Care | Payer: Medicare Other | Source: Ambulatory Visit | Attending: Family | Admitting: Family

## 2019-03-09 ENCOUNTER — Other Ambulatory Visit: Payer: Self-pay

## 2019-03-09 ENCOUNTER — Ambulatory Visit (HOSPITAL_COMMUNITY)
Admission: RE | Admit: 2019-03-09 | Discharge: 2019-03-09 | Disposition: A | Discharge status: Home / Self Care | Payer: Medicare Other | Source: Ambulatory Visit | Attending: Family | Admitting: Family

## 2019-03-09 DIAGNOSIS — I714 Abdominal aortic aneurysm, without rupture, unspecified: Secondary | ICD-10-CM

## 2019-03-09 DIAGNOSIS — I6523 Occlusion and stenosis of bilateral carotid arteries: Secondary | ICD-10-CM | POA: Insufficient documentation

## 2019-03-11 ENCOUNTER — Encounter: Payer: Self-pay | Admitting: Family

## 2019-03-11 ENCOUNTER — Other Ambulatory Visit: Payer: Self-pay

## 2019-03-11 ENCOUNTER — Ambulatory Visit (INDEPENDENT_AMBULATORY_CARE_PROVIDER_SITE_OTHER): Payer: Medicare Other | Admitting: Family

## 2019-03-11 DIAGNOSIS — I714 Abdominal aortic aneurysm, without rupture, unspecified: Secondary | ICD-10-CM

## 2019-03-11 DIAGNOSIS — Z87891 Personal history of nicotine dependence: Secondary | ICD-10-CM | POA: Diagnosis not present

## 2019-03-11 DIAGNOSIS — I6523 Occlusion and stenosis of bilateral carotid arteries: Secondary | ICD-10-CM | POA: Diagnosis not present

## 2019-03-11 DIAGNOSIS — Z95828 Presence of other vascular implants and grafts: Secondary | ICD-10-CM | POA: Diagnosis not present

## 2019-03-11 NOTE — Progress Notes (Signed)
Virtual Visit via Telephone Note  I connected with Paul Sandoval. on 03/11/2019 using the [employer] by telephone and verified that I was speaking with the correct person using two identifiers. Patient was located at his home and accompanied by himself. I am located at the [facility] office.   The limitations of evaluation and management by telemedicine and the availability of in person appointments have been previously discussed with the patient and are documented in the patients chart. The patient expressed understanding and consented to proceed.  PCP: [name], MD  Chief Complaint: Follow up s/p EVAR and extracranial carotid artery stenosis   History of Present Illness: Paul Musa. is a 78 y.o. male who has a previous history of endovascular repair of abdominal aortic aneurysm in April 2017 by Dr. Kellie Simmering.   He is also followed for his carotid artery disease which has been stable.   He has no back back or abdominal pain. He missed his appointment in 2017 due to multiple other medical issues. He was unfortunately diagnosed with right lung cancer for which he had undergone radiation tx and was undergoing chemotherapy. He remained very positive in his attitude.  Dr. Donzetta Matters last evaluated pt on 05-24-17. At that time pt was stable from the EVAR  standpoint and also had stable carotid disease. He unfortunately had lung cancer for which she was undergoing treatment although he appeared healthy and in good spirits. Pt was to follow up in 1 year with repeat carotid duplex and EVAR duplex.  He denies any history of stroke or TIA.   He finished chemo and radiation treatment for lung cancer in 2017.  He sees Dr. Aundra Dubin for CHF.   Tobacco use: former smoker, quit in 1996, smoked 1.5 ppd x 30 years He does not have DM.  He takes a daily 81 mg ASA, Plavix, and a statin.   Past Medical History:  Diagnosis Date  . AAA (abdominal aortic aneurysm) ([place]) 2010   4.4 cm 08/2008;4.44 in  7/10 and 4.65 in 08/2009; 4.8 by CT in 11/2009; 4.3 by ultrasound in 08/2010  . Anemia   . Arteriosclerotic cardiovascular disease (ASCVD) 1996   CABG-1996  . Arthritis    "fingers" (03/18/2014)  . CAD (coronary artery disease)    03/18/14:  PCI with DES to distal left main. 7/29: DES to the SVG to Diag  . Cancer ([place])    Upper right lobe lung cancer  . Cardiomyopathy, ischemic    Echo 03/17/14: EF 45-50%  . Chronic bronchitis ([place])   . Chronic kidney disease    CRF  . Chronic rhinitis   . Colonic polyp 2002   polypectomy in 2002  . COPD (chronic obstructive pulmonary disease) ([place])   . Diverticulosis   . Dyspnea    with exertion  . ED (erectile dysfunction)   . Encounter for antineoplastic chemotherapy 12/19/2015  . GERD (gastroesophageal reflux disease)   . History of blood transfusion   . Hyperlipidemia   . Hypertension   . IFG (impaired fasting glucose)   . Myocardial infarction [facility])    "told h/o silent MI sometime before 1996"  . Pneumonia ~ 2001; ~ 2005    has had more than twice  . Right bundle branch block   . Tobacco abuse, in remission    40 pack year total consumption; discontinued in 1996    Past Surgical History:  Procedure Laterality Date  . ABDOMINAL AORTIC ANEURYSM REPAIR  11/2012  .  ABDOMINAL AORTIC ENDOVASCULAR STENT GRAFT N/A 12/11/2012   Procedure: ABDOMINAL AORTIC ENDOVASCULAR STENT GRAFT;  Surgeon: Mal Misty, MD;  Location: [place];  Service: Vascular;  Laterality: N/A;  Ultrasound guided; [place]  . CARDIAC CATHETERIZATION  01/08/1995  . COLONOSCOPY  2002   polypectomy-patient denies  . CORONARY ANGIOPLASTY WITH STENT PLACEMENT  03/18/2014   "1"  . CORONARY ANGIOPLASTY WITH STENT PLACEMENT  03/24/2014   "1"  . CORONARY ARTERY BYPASS GRAFT  01/09/1995   "CABG X3"  . FLEXIBLE BRONCHOSCOPY N/A 03/01/2017   Procedure: FLEXIBLE BRONCHOSCOPY WITH BIOPSIES;  Surgeon: Gaye Pollack, MD;  Location: [facility] OR;  Service: Thoracic;  Laterality: N/A;  . JOINT  REPLACEMENT    . LAPAROSCOPIC CHOLECYSTECTOMY  12/2009  . LEFT AND RIGHT HEART CATHETERIZATION WITH CORONARY/GRAFT ANGIOGRAM N/A 03/18/2014   Procedure: LEFT AND RIGHT HEART CATHETERIZATION WITH Beatrix Fetters;  Surgeon: Blane Ohara, MD;  Location: [facility] CATH LAB;  Service: Cardiovascular;  Laterality: N/A;  . PERCUTANEOUS CORONARY STENT INTERVENTION (PCI-S)  03/18/2014   Procedure: PERCUTANEOUS CORONARY STENT INTERVENTION (PCI-S);  Surgeon: Blane Ohara, MD;  Location: [facility] CATH LAB;  Service: Cardiovascular;;  . PERCUTANEOUS CORONARY STENT INTERVENTION (PCI-S) N/A 03/24/2014   Procedure: PERCUTANEOUS CORONARY STENT INTERVENTION (PCI-S);  Surgeon: Blane Ohara, MD;  Location: [facility] CATH LAB;  Service: Cardiovascular;  Laterality: N/A;  . PLEURAL EFFUSION DRAINAGE Right 03/01/2017   Procedure: DRAINAGE OF PLEURAL EFFUSION;  Surgeon: Gaye Pollack, MD;  Location: [place];  Service: Thoracic;  Laterality: Right;  . RIGHT/LEFT HEART CATH AND CORONARY/GRAFT ANGIOGRAPHY N/A 05/15/2017   Procedure: RIGHT/LEFT HEART CATH AND CORONARY/GRAFT ANGIOGRAPHY;  Surgeon: Larey Dresser, MD;  Location: [place] CV LAB;  Service: Cardiovascular;  Laterality: N/A;  . TALC PLEURODESIS Right 03/01/2017   Procedure: Pietro Cassis;  Surgeon: Gaye Pollack, MD;  Location: [place];  Service: Thoracic;  Laterality: Right;  . TOTAL HIP ARTHROPLASTY Left 01/21/2013   Procedure: TOTAL HIP ARTHROPLASTY ANTERIOR APPROACH;  Surgeon: Mauri Pole, MD;  Location: [place];  Service: Orthopedics;  Laterality: Left;  Marland Kitchen VIDEO ASSISTED THORACOSCOPY Right 03/01/2017   Procedure: VIDEO ASSISTED THORACOSCOPY WITH BIOPSIES;  Surgeon: Gaye Pollack, MD;  Location: [place];  Service: Thoracic;  Laterality: Right;  Marland Kitchen VIDEO BRONCHOSCOPY N/A 11/17/2015   Procedure: VIDEO BRONCHOSCOPY WITH FLUORO;  Surgeon: Rigoberto Noel, MD;  Location: [place];  Service: Cardiopulmonary;  Laterality: N/A;    Current Meds  Medication Sig  .  acetaminophen (TYLENOL) 500 MG tablet Take 500 mg by mouth every 6 (six) hours as needed for mild pain.  Marland Kitchen aspirin EC 81 MG tablet Take 81 mg by mouth daily before breakfast.   . carvedilol (COREG) 12.5 MG tablet Take 1.5 tablets (18.75 mg total) by mouth 2 (two) times daily with a meal.  . clopidogrel (PLAVIX) 75 MG tablet TAKE (1) TABLET BY MOUTH ONCE DAILY.  Marland Kitchen eplerenone (INSPRA) 50 MG tablet Take 1 tablet (50 mg total) by mouth daily.  . Ferrous Sulfate (IRON) 28 MG TABS Take 65 mg by mouth daily.   . furosemide (LASIX) 40 MG tablet Take 1 tablet (40 mg total) by mouth daily.  . GuaiFENesin (MUCUS RELIEF ADULT PO) Take 2 capsules by mouth 3 (three) times daily.  Marland Kitchen ipratropium-albuterol (DUONEB) 0.5-2.5 (3) MG/3ML SOLN INHALE 1 VIAL VIA NEBULIZER 4 TIMES DAILY.  . methylPREDNISolone (MEDROL DOSEPAK) 4 MG TBPK tablet TAKE 1 TABLET BY MOUTH EVERY OTHER DAY.  . methylPREDNISolone (MEDROL)  4 MG tablet Take 4 mg by mouth every other day.  . Multiple Vitamins-Minerals (CENTRUM SILVER ADULT 50+) TABS Take 1 tablet by mouth daily with lunch.   . nitroGLYCERIN (NITROSTAT) 0.4 MG SL tablet PLACE 1 TAB UNDER TONGUE EVERY 5 MIN IF NEEDED FOR CHEST PAIN. MAY USE 3 TIMES.NO RELIEF CALL 911.  . omeprazole (PRILOSEC) 20 MG capsule Take 1 capsule (20 mg total) by mouth daily.  Marland Kitchen PROAIR HFA 108 (90 Base) MCG/ACT inhaler INHALE 2 PUFFS BY MOUTH EVERY 4 TO 6 HOURS AS NEEDED FOR WHEEZING.  . Probiotic Product (FLORAJEN3 PO) Take 1 tablet by mouth daily.  . sacubitril-valsartan (ENTRESTO) 49-51 MG TAKE (1) TABLET BY MOUTH TWICE DAILY.  . simvastatin (ZOCOR) 80 MG tablet Take 0.5 tablets (40 mg total) by mouth at bedtime.    12 system ROS was negative unless otherwise noted in HPI   Observations/Objective:  DATA  Carotid Duplex  (03-09-19): Right Carotid: Velocities in the right ICA are consistent with a 40-59%                stenosis. Non-hemodynamically significant plaque <50% noted in                the  CCA. The ECA appears <50% stenosed.  Left Carotid: Velocities in the left ICA are consistent with a 40-59% stenosis.               Non-hemodynamically significant plaque noted in the CCA. The ECA               appears >50% stenosed.  Vertebrals:  Bilateral vertebral arteries demonstrate antegrade flow. Subclavians: Right subclavian artery flow was disturbed. Normal flow              hemodynamics were seen in the left subclavian artery. Monophasic              right subclavian artery. Increased stenosis in the left ICA, stable in the right ICA, compared to the exam on 05-24-17.   EVAR Duplex (03-09-19): Endovascular Aortic Repair (EVAR): +----------+----------------+-------------------+-------------------+           Diameter AP (cm)Diameter Trans (cm)Velocities (cm/sec) +----------+----------------+-------------------+-------------------+ Aorta     3.44            3.86               83                  +----------+----------------+-------------------+-------------------+ Right Limb1.38            1.64               69                  +----------+----------------+-------------------+-------------------+ Left Limb 1.41            1.46               101                 +----------+----------------+-------------------+-------------------+  Summary: Abdominal Aorta: The largest aortic measurement is 3.9 cm. Patent endovascular aneurysm repair with no evidence of endoleak. The largest aortic diameter remains essentially unchanged compared to prior exam. Previous diameter measurement was 3.4 cm obtained on 05/16/2017.     Assessment and Plan: Pt has a previous history of endovascular repair of abdominal aortic aneurysm in April 2017 by Dr. Kellie Simmering.   He is also followed for his carotid artery disease which has been stable. He has no  history of stroke or TIA.   Carotid duplex on 03-09-19 shows stable stenosis on the right at 40-59%, and increased stenosis in the left  at 40-59%; was 1-39% in September 2018.   EVAR duplex on 03-09-19 shows no endoleak and a sac size of 3.9 cm, stable compared to 3.4 cm on 05-16-17.   Follow Up Instructions:   Follow up in 1 year with carotid duplex and EVAR duplex.    I discussed the assessment and treatment plan with the patient. The patient was provided an opportunity to ask questions and all were answered. The patient agreed with the plan and demonstrated an understanding of the instructions.   The patient was advised to call back or seek an in-person evaluation if the symptoms worsen or if the condition fails to improve as anticipated.  I spent 14 minutes with the patient via telephone encounter.   Gabrielle Dare Dosia Yodice [facility] [place] Office: [phone]  03/11/2019, 3:03 PM

## 2019-03-11 NOTE — Patient Instructions (Signed)
Before your next abdominal ultrasound:  Avoid gas forming foods and beverages the day before the test.   Take two Extra-Strength Gas-X capsules at bedtime the night before the test. Take another two Extra-Strength Gas-X capsules in the middle of the night if you get up to the restroom, if not, first thing in the morning with water.  Do not chew gum.     Stroke Prevention Some medical conditions and lifestyle choices can lead to a higher risk for a stroke. You can help to prevent a stroke by making nutrition, lifestyle, and other changes. What nutrition changes can be made?   Eat healthy foods. ? Choose foods that are high in fiber. These include:  Fresh fruits.  Fresh vegetables.  Whole grains. ? Eat at least 5 or more servings of fruits and vegetables each day. Try to fill half of your plate at each meal with fruits and vegetables. ? Choose lean protein foods. These include:  Lowfat (lean) cuts of meat.  Chicken without skin.  Fish.  Tofu.  Beans.  Nuts. ? Eat low-fat dairy products. ? Avoid foods that:  Are high in salt (sodium).  Have saturated fat.  Have trans fat.  Have cholesterol.  Are processed.  Are premade.  Follow eating guidelines as told by your doctor. These may include: ? Reducing how many calories you eat and drink each day. ? Limiting how much salt you eat or drink each day to 1,500 milligrams (mg). ? Using only healthy fats for cooking. These include:  Olive oil.  Canola oil.  Sunflower oil. ? Counting how many carbohydrates you eat and drink each day. What lifestyle changes can be made?  Try to stay at a healthy weight. Talk to your doctor about what a good weight is for you.  Get at least 30 minutes of moderate physical activity at least 5 days a week. This can include: ? Fast walking. ? Biking. ? Swimming.  Do not use any products that have nicotine or tobacco. This includes cigarettes and e-cigarettes. If you need help  quitting, ask your doctor. Avoid being around tobacco smoke in general.  Limit how much alcohol you drink to no more than 1 drink a day for nonpregnant women and 2 drinks a day for men. One drink equals 12 oz of beer, 5 oz of wine, or 1 oz of hard liquor.  Do not use drugs.  Avoid taking birth control pills. Talk to your doctor about the risks of taking birth control pills if: ? You are over 41 years old. ? You smoke. ? You get migraines. ? You have had a blood clot. What other changes can be made?  Manage your cholesterol. ? It is important to eat a healthy diet. ? If your cholesterol cannot be managed through your diet, you may also need to take medicines. Take medicines as told by your doctor.  Manage your diabetes. ? It is important to eat a healthy diet and to exercise regularly. ? If your blood sugar cannot be managed through diet and exercise, you may need to take medicines. Take medicines as told by your doctor.  Control your high blood pressure (hypertension). ? Try to keep your blood pressure below 130/80. This can help lower your risk of stroke. ? It is important to eat a healthy diet and to exercise regularly. ? If your blood pressure cannot be managed through diet and exercise, you may need to take medicines. Take medicines as told by your doctor. ?  Ask your doctor if you should check your blood pressure at home. ? Have your blood pressure checked every year. Do this even if your blood pressure is normal.  Talk to your doctor about getting checked for a sleep disorder. Signs of this can include: ? Snoring a lot. ? Feeling very tired.  Take over-the-counter and prescription medicines only as told by your doctor. These may include aspirin or blood thinners (antiplatelets or anticoagulants).  Make sure that any other medical conditions you have are managed. Where to find more information  American Stroke Association: www.strokeassociation.org  National Stroke  Association: www.stroke.org Get help right away if:  You have any symptoms of stroke. "BE FAST" is an easy way to remember the main warning signs: ? B - Balance. Signs are dizziness, sudden trouble walking, or loss of balance. ? E - Eyes. Signs are trouble seeing or a sudden change in how you see. ? F - Face. Signs are sudden weakness or loss of feeling of the face, or the face or eyelid drooping on one side. ? A - Arms. Signs are weakness or loss of feeling in an arm. This happens suddenly and usually on one side of the body. ? S - Speech. Signs are sudden trouble speaking, slurred speech, or trouble understanding what people say. ? T - Time. Time to call emergency services. Write down what time symptoms started.  You have other signs of stroke, such as: ? A sudden, very bad headache with no known cause. ? Feeling sick to your stomach (nausea). ? Throwing up (vomiting). ? Jerky movements you cannot control (seizure). These symptoms may represent a serious problem that is an emergency. Do not wait to see if the symptoms will go away. Get medical help right away. Call your local emergency services (911 in the [place] Do not drive yourself to the hospital. Summary  You can prevent a stroke by eating healthy, exercising, not smoking, drinking less alcohol, and treating other health problems, such as diabetes, high blood pressure, or high cholesterol.  Do not use any products that contain nicotine or tobacco, such as cigarettes and e-cigarettes.  Get help right away if you have any signs or symptoms of a stroke. This information is not intended to replace advice given to you by your health care provider. Make sure you discuss any questions you have with your health care provider. Document Released: 02/12/2012 Document Revised: 10/09/2018 Document Reviewed: 11/14/2016 Elsevier Patient Education  2020 [employer].

## 2019-03-12 ENCOUNTER — Telehealth: Payer: Self-pay | Admitting: Family Medicine

## 2019-03-12 NOTE — Telephone Encounter (Signed)
Patient came by and dropped off his CT scan result because he said he didn't understand the results and wanted someone to call him and explain it.  Report put in Dr. Jeannine Kitten folder.

## 2019-03-12 NOTE — Telephone Encounter (Signed)
Telephone call- Line busy

## 2019-03-12 NOTE — Telephone Encounter (Signed)
Let pt know th e ordering speialist has to first discuss with the patient their interpretation of the results because only they know why they ordered the test. interprettion of test results require the clinician to know why they ordered it

## 2019-03-12 NOTE — Telephone Encounter (Signed)
Do not have in my folder

## 2019-03-13 NOTE — Telephone Encounter (Signed)
Patient advised per Dr Richardson Landry:  the ordering speialist has to first discuss with the patient their interpretation of the results because only they know why they ordered the test. interprettion of test results require the clinician to know why they ordered it. Patient verbalized understanding and stated this was ordered by the [facility] doctor and he thought he had a understanding that he saw them because it was cheaper and let them do tests but that Dr Richardson Landry would be the doctor to run with everything but he understood he would need to call them

## 2019-03-17 ENCOUNTER — Encounter: Payer: Self-pay | Admitting: Pulmonary Disease

## 2019-03-17 ENCOUNTER — Ambulatory Visit (INDEPENDENT_AMBULATORY_CARE_PROVIDER_SITE_OTHER): Payer: Medicare Other | Admitting: Pulmonary Disease

## 2019-03-17 ENCOUNTER — Telehealth: Payer: Self-pay | Admitting: Pulmonary Disease

## 2019-03-17 ENCOUNTER — Other Ambulatory Visit: Payer: Self-pay

## 2019-03-17 DIAGNOSIS — Z2239 Carrier of other specified bacterial diseases: Secondary | ICD-10-CM | POA: Diagnosis not present

## 2019-03-17 DIAGNOSIS — J449 Chronic obstructive pulmonary disease, unspecified: Secondary | ICD-10-CM

## 2019-03-17 MED ORDER — LEVOFLOXACIN 500 MG PO TABS: 500.0000 mg | ORAL_TABLET | Freq: Every day | ORAL | 0 refills | Status: DC

## 2019-03-17 NOTE — Assessment & Plan Note (Signed)
Plan: Continue Symbicort 160 Continue Medrol 4 mg every other day Levaquin prescription today Sputum culture ordered today Follow-up in 1 to 2 months with Dr. Elsworth Soho, will coordinate with office staff to ensure he has placed on the list for when Dr. Bari Mantis clinic days open If patient recurrently has issues with exacerbations may need to consider [place], and/or pulmonary function testing Explained to patient repeatedly that if symptoms are not improving under this current regimen he will need to present to our office for an in person office visit

## 2019-03-17 NOTE — Patient Instructions (Addendum)
Please produce a sputum sample and bring to our office >>> Sputum culture 3 ways: Respiratory sputum culture, AFB, fungal  Levaquin 500mg  tablet >>> Take 1 500 mg tablet daily for the next 5 days >>> Take with food >>> start probiotic for good gut health >>>avoid rigorous exercise for next 2 weeks   Continue Symbicort 160 >>> 2 puffs in the morning right when you wake up, rinse out your mouth after use, 12 hours later 2 puffs, rinse after use >>> Take this daily, no matter what >>> This is not a rescue inhaler   Continue Medrol 4mg  tablet every other day   Continue DuoNeb nebulized medication every 6-8 hours as needed for shortness of breath and wheezing  Only use your albuterol as a rescue medication to be used if you can't catch your breath by resting or doing a relaxed purse lip breathing pattern.  - The less you use it, the better it will work when you need it. - Ok to use up to 2 puffs  every 4 hours if you must but call for immediate appointment if use goes up over your usual need - Don't leave home without it !!  (think of it like the spare tire for your car)   Note your daily symptoms > remember "red flags" for COPD:   >>>Increase in cough >>>increase in sputum production >>>increase in shortness of breath or activity  intolerance.   If you notice these symptoms, please call the office to be seen.    Return in about 4 weeks (around 04/14/2019), or if symptoms worsen or fail to improve, for Follow up with Dr. Elsworth Soho.   Coronavirus (COVID-19) Are you at risk?  Are you at risk for the Coronavirus (COVID-19)?  To be considered HIGH RISK for Coronavirus (COVID-19), you have to meet the following criteria:  . Traveled to [place], [place], [place], [place] or [place]; or in the [place] to [place], [place], [place], or [place]; and have fever, cough, and shortness of breath within the last 2 weeks of travel OR . Been in close contact with a person diagnosed with  COVID-19 within the last 2 weeks and have fever, cough, and shortness of breath . IF YOU DO NOT MEET THESE CRITERIA, YOU ARE CONSIDERED LOW RISK FOR COVID-19.  What to do if you are HIGH RISK for COVID-19?  Marland Kitchen If you are having a medical emergency, call 911. . Seek medical care right away. Before you go to a doctor's office, urgent care or emergency department, call ahead and tell them about your recent travel, contact with someone diagnosed with COVID-19, and your symptoms. You should receive instructions from your physician's office regarding next steps of care.  . When you arrive at healthcare provider, tell the healthcare staff immediately you have returned from visiting [place], [place], [place], [place] or [place]; or traveled in the [place] to [place], [place], [place], or [place]; in the last two weeks or you have been in close contact with a person diagnosed with COVID-19 in the last 2 weeks.   . Tell the health care staff about your symptoms: fever, cough and shortness of breath. . After you have been seen by a medical provider, you will be either: o Tested for (COVID-19) and discharged home on quarantine except to seek medical care if symptoms worsen, and asked to  - Stay home and avoid contact with others until you get your results (4-5 days)  - Avoid  travel on public transportation if possible (such as bus, train, or airplane) or o Sent to the Emergency Department by EMS for evaluation, COVID-19 testing, and possible admission depending on your condition and test results.  What to do if you are LOW RISK for COVID-19?  Reduce your risk of any infection by using the same precautions used for avoiding the common cold or flu:  Marland Kitchen Wash your hands often with soap and warm water for at least 20 seconds.  If soap and water are not readily available, use an alcohol-based hand sanitizer with at least 60% alcohol.  . If coughing or sneezing, cover your mouth and nose by coughing or  sneezing into the elbow areas of your shirt or coat, into a tissue or into your sleeve (not your hands). . Avoid shaking hands with others and consider head nods or verbal greetings only. . Avoid touching your eyes, nose, or mouth with unwashed hands.  . Avoid close contact with people who are sick. . Avoid places or events with large numbers of people in one location, like concerts or sporting events. . Carefully consider travel plans you have or are making. . If you are planning any travel outside or inside the [place], visit the CDC's Travelers' Health webpage for the latest health notices. . If you have some symptoms but not all symptoms, continue to monitor at home and seek medical attention if your symptoms worsen. . If you are having a medical emergency, call 911.   [place] / e-Visit: [email or web address]         [facility]: [facility]: [phone]                   [facility]: [phone]           It is flu season:   >>> Best ways to protect herself from the flu: Receive the yearly flu vaccine, practice good hand hygiene washing with soap and also using hand sanitizer when available, eat a nutritious meals, get adequate rest, hydrate appropriately   Please contact the office if your symptoms worsen or you have concerns that you are not improving.   Thank you for choosing [facility] for your healthcare, and for allowing [place] to partner with you on your healthcare journey. I am thankful to be able to provide care to you today.   Wyn Quaker FNP-C

## 2019-03-17 NOTE — Telephone Encounter (Signed)
Called and spoke with pt who stated he has been coughing up green to yellow phlegm x2 days now. Pt is using nebulizer 3-4 times daily as prescribed and also is using symbicort inhaler.  Pt states that he has not tried any OTC meds to see if it would help with his cough.   Pt denies any fever and has not had to be tested for COVID for any reasons. Pt also has not done any traveling.   Stated to pt that we needed to get him scheduled for televisit with APP and pt verbalized understanding. Pt has been scheduled for appt with Aaron Edelman at 3:30 for televisit. Nothing further needed.

## 2019-03-17 NOTE — Assessment & Plan Note (Signed)
Plan: Sputum culture ordered today Levaquin ordered today If symptoms do not improve into this regimen patient needs to present to our office for follow-up

## 2019-03-17 NOTE — Progress Notes (Signed)
Virtual Visit via Telephone Note  I connected with Paul Sandoval. on 03/17/19 at  3:30 PM EDT by telephone and verified that I am speaking with the correct person using two identifiers.  Location: Patient: Home Provider: [facility] - [address], [place], [place], [place] [address]   I discussed the limitations, risks, security and privacy concerns of performing an evaluation and management service by telephone and the availability of in person appointments. I also discussed with the patient that there may be a patient responsible charge related to this service. The patient expressed understanding and agreed to proceed.  Patient consented to consult via telephone: Yes People present and their role in pt care: Pt    History of Present Illness: 78 year old male former smoker followed in our office for COPD  Past medical history: Hyperlipidemia, hypertension, GERD, AAA, chronic kidney disease stage III, cardiomyopathy, chronic combined systolic and diastolic heart failure Smoking history: Former smoker.  Quit 1996.  45-pack-year smoking history Maintenance: Symbicort 160, 4mg  Medrol QOD Patient of Dr. Elsworth Soho  Chief complaint: Mucous Color Change, cough  78 year old male former smoker followed in our office for COPD.  Patient contacted our office to report that he had had a color change to his sputum.  Symptoms started to change and worsen 1 week ago.  Patient has had sputum color change from white to clear to yellow.  Patient has a history of Serratia and M catarrhalis in sputum cultures in 2018.  Patient reported that in the past he has always been informed by Dr. Lenna Gilford as well as Dr. Elsworth Soho to notify our office when this is happening.  Patient also reports that he does have an occasional wheeze.  This typically is with physical exertion.  Patient was last treated with antibiotics in March/2020 telephonically with doxycycline.  Patient did not feel that this adequately  treated his symptoms.  But he never followed back up with our office or contacted [place] regarding this.  Patient is waiting to follow-up with Dr. Elsworth Soho but his appointment in March that was planned was rescheduled due to COVID-19.  Patient is also maintained on 4 mg Medrol every other day.  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, no definite findings to suggest local recurrence of disease or metastatic disease in the thorax, aortic arthrosclerosis  04/09/2018-echocardiogram-LV ejection fraction 25 to 30%, diffuse hypokinesis,  02/01/2016-pulmonary function test- FVC 1.96 (45% predicted), postbronchodilator ratio of 67, postbronchodilator FEV1 1.64 (53% predicted, postbronchodilator response, significant mid flow reversibility, DLCO 58  Assessment and Plan:  Chronic obstructive airway disease with asthma (HCC) Plan: Continue Symbicort 160 Continue Medrol 4 mg every other day Levaquin prescription today Sputum culture ordered today Follow-up in 1 to 2 months with Dr. Elsworth Soho, will coordinate with office staff to ensure he has placed on the list for when Dr. Bari Mantis clinic days open If patient recurrently has issues with exacerbations may need to consider [place], and/or pulmonary function testing Explained to patient repeatedly that if symptoms are not improving under this current regimen he will need to present to our office for an in person office visit  Bacterial colonization of lower respiratory tract Plan: Sputum culture ordered today Levaquin ordered today If symptoms do not improve into this regimen patient needs to present to our  office for follow-up   Follow Up Instructions:  Return in  about 4 weeks (around 04/14/2019), or if symptoms worsen or fail to improve, for Follow up with Dr. Elsworth Soho.   I discussed the assessment and treatment plan with the patient. The patient was provided an opportunity to ask questions and all were answered. The patient agreed with the plan and demonstrated an understanding of the instructions.   The patient was advised to call back or seek an in-person evaluation if the symptoms worsen or if the condition fails to improve as anticipated.  I provided 22 minutes of non-face-to-face time during this encounter.   Lauraine Rinne, NP

## 2019-03-18 ENCOUNTER — Telehealth: Payer: Self-pay | Admitting: Pulmonary Disease

## 2019-03-18 NOTE — Telephone Encounter (Signed)
ATC Patient.  No answer, no VM, unable to leave message x's 2.

## 2019-03-19 ENCOUNTER — Other Ambulatory Visit: Payer: Medicare Other

## 2019-03-19 DIAGNOSIS — J449 Chronic obstructive pulmonary disease, unspecified: Secondary | ICD-10-CM | POA: Diagnosis not present

## 2019-03-19 NOTE — Telephone Encounter (Signed)
Spoke with pt, he states that the sputum sample was dropped off today. Nothing further is needed at this time.

## 2019-03-19 NOTE — Telephone Encounter (Signed)
ATC pt, no answer. Left message for pt to call back.  

## 2019-03-19 NOTE — Telephone Encounter (Signed)
Attempted to call pt but no answer and unable to leave a message. Will try to call back later.

## 2019-03-19 NOTE — Telephone Encounter (Signed)
Pt returning call - CB# [phone]

## 2019-03-22 LAB — RESPIRATORY CULTURE OR RESPIRATORY AND SPUTUM CULTURE
MICRO NUMBER:: 699169
RESULT:: NORMAL
SPECIMEN QUALITY:: ADEQUATE

## 2019-03-23 ENCOUNTER — Telehealth: Payer: Self-pay | Admitting: Pulmonary Disease

## 2019-03-23 NOTE — Telephone Encounter (Signed)
Patient returned phone call, made aware of lab results:   Notes recorded by Lauraine Rinne, NP on 03/23/2019 at 8:48 AM EDT  Can contact patient let him know sputum cultures are showing growth of normal oropharyngeal flora. No changes in plan of care at this time.   Voiced understanding. Nothing further is needed at this time.

## 2019-03-23 NOTE — Progress Notes (Signed)
Can contact patient let him know sputum cultures are showing growth of normal oropharyngeal flora.  No changes in plan of care at this time.  Wyn Quaker, FNP

## 2019-03-28 DIAGNOSIS — J449 Chronic obstructive pulmonary disease, unspecified: Secondary | ICD-10-CM | POA: Diagnosis not present

## 2019-04-02 ENCOUNTER — Ambulatory Visit (INDEPENDENT_AMBULATORY_CARE_PROVIDER_SITE_OTHER): Payer: Medicare Other | Admitting: Pulmonary Disease

## 2019-04-02 ENCOUNTER — Other Ambulatory Visit: Payer: Self-pay

## 2019-04-02 ENCOUNTER — Encounter: Payer: Self-pay | Admitting: Pulmonary Disease

## 2019-04-02 DIAGNOSIS — C3411 Malignant neoplasm of upper lobe, right bronchus or lung: Secondary | ICD-10-CM | POA: Diagnosis not present

## 2019-04-02 DIAGNOSIS — J449 Chronic obstructive pulmonary disease, unspecified: Secondary | ICD-10-CM | POA: Diagnosis not present

## 2019-04-02 NOTE — Assessment & Plan Note (Signed)
Last CT chest 11/2018 was reviewed which shows extensive radiation changes in the right upper lobe He will have a surveillance CT per oncology

## 2019-04-02 NOTE — Progress Notes (Signed)
Subjective:    Patient ID: Paul Ewing., male    DOB: [birth year], 78 y.o.   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 Follow-up    breathing stable - congestion improved - completed levaquin   He has followed with Dr. Lenna Gilford for many years, has been maintained on 4 mg Medrol every other day for some reason.  He has frequent episodes of congestion and requires frequent doses of antibiotics, he takes an over-the-counter probiotic daily Last seen 03/17/19 -for bronchitis-like symptoms and given Levaquin and Medrol after which she is much improved.  Shortness of breath is at baseline, he is able to carry out activities of daily living and also takes care of his own yard  He has several questions today including his medications especially Medrol, Mucinex and Florajen tablets  Prior sputum culture has shown Serratia  Significant tests/ events reviewed  12/12/2018-CT chest with contrast- stable appearance of chest with posttreatment related changes including chronic postradiation masslike fibrosis in the lungs, no definite findings to suggest local recurrence of disease or metastatic disease in the thorax, aortic arthrosclerosis  04/09/2018-echocardiogram-LV ejection fraction 25 to 30%, diffuse hypokinesis,  02/01/2016-pulmonary function test- FVC 1.96 (45% predicted), postbronchodilator ratio of 67, postbronchodilator FEV1 1.64 (53% predicted, postbronchodilator response, significant mid flow reversibility, DLCO 58   Past Medical History:  Diagnosis Date  . AAA (abdominal aortic aneurysm) ([place]) 2010   4.4 cm 08/2008;4.44 in 7/10 and 4.65 in 08/2009; 4.8 by CT in 11/2009; 4.3 by ultrasound in 08/2010  . Anemia   . Arteriosclerotic cardiovascular disease (ASCVD) 1996   CABG-1996  . Arthritis    "fingers"  (03/18/2014)  . CAD (coronary artery disease)    03/18/14:  PCI with DES to distal left main. 7/29: DES to the SVG to Diag  . Cancer ([place])    Upper right lobe lung cancer  . Cardiomyopathy, ischemic    Echo 03/17/14: EF 45-50%  . Chronic bronchitis ([place])   . Chronic kidney disease    CRF  . Chronic rhinitis   . Colonic polyp 2002   polypectomy in 2002  . COPD (chronic obstructive pulmonary disease) ([place])   . Diverticulosis   . Dyspnea    with exertion  . ED (erectile dysfunction)   . Encounter for antineoplastic chemotherapy 12/19/2015  . GERD (gastroesophageal reflux disease)   . History of blood transfusion   . Hyperlipidemia   . Hypertension   . IFG (impaired fasting glucose)   . Myocardial infarction [facility])    "told h/o silent MI sometime before 1996"  . Pneumonia ~ 2001; ~ 2005    has had more than twice  . Right bundle branch block   . Tobacco abuse, in remission    40 pack year total consumption; discontinued in 1996     Review of Systems neg for any significant sore throat, dysphagia, itching, sneezing, nasal congestion or excess/ purulent secretions, fever, chills, sweats, unintended wt loss, pleuritic or exertional cp, hempoptysis, orthopnea pnd or change in chronic leg swelling. Also denies presyncope, palpitations, heartburn, abdominal pain, nausea, vomiting, diarrhea or change in bowel or urinary habits, dysuria,hematuria, rash, arthralgias, visual complaints, headache, numbness weakness or ataxia.     Objective:   Physical Exam   Gen.  Pleasant, well-nourished, in no distress, normal affect ENT - no pallor,icterus, no post nasal drip Neck: No JVD, no thyromegaly, no carotid bruits Lungs: no use of accessory muscles, no dullness to percussion, RT rhonchi , clear onleft Cardiovascular: Rhythm regular, heart sounds  normal, no murmurs or gallops, no peripheral edema Abdomen: soft and non-tender, no hepatosplenomegaly, BS normal. Musculoskeletal: No deformities, no  cyanosis or clubbing Neuro:  alert, non focal        Assessment & Plan:

## 2019-04-02 NOTE — Patient Instructions (Signed)
Continue on Symbicort-2 puffs twice daily. Decrease Medrol tablet to 4 mg every Monday and Friday -starting November, if no worse, can decrease to 4 mg only on Mondays  Take Florajen 3 OTC only when you take antibiotics Take Mucinex tablets when you are producing phlegm -2 tablets twice daily

## 2019-04-02 NOTE — Assessment & Plan Note (Signed)
Much improved after recent flare, for some reason he has been maintained on steroids for a long time He does take frequent antibiotics when he has sputum color changes  Continue on Symbicort-2 puffs twice daily. Decrease Medrol tablet to 4 mg every Monday and Friday -starting November, if no worse, can decrease to 4 mg only on Mondays  Take Florajen 3 OTC only when you take antibiotics Take Mucinex tablets when you are producing phlegm -2 tablets twice daily

## 2019-04-28 ENCOUNTER — Other Ambulatory Visit (HOSPITAL_COMMUNITY): Payer: Self-pay | Admitting: Cardiology

## 2019-04-28 DIAGNOSIS — C3411 Malignant neoplasm of upper lobe, right bronchus or lung: Secondary | ICD-10-CM

## 2019-04-29 ENCOUNTER — Telehealth: Payer: Self-pay | Admitting: Pulmonary Disease

## 2019-04-29 DIAGNOSIS — J449 Chronic obstructive pulmonary disease, unspecified: Secondary | ICD-10-CM

## 2019-04-29 NOTE — Telephone Encounter (Signed)
Patient is returning phone call.  Patient phone number is [phone].  Patient states needs to change Nebulizer kit today.

## 2019-04-29 NOTE — Telephone Encounter (Signed)
ATC pt, no answer. Left message for pt to call back.  

## 2019-04-29 NOTE — Telephone Encounter (Signed)
Order has been faxed to [facility]

## 2019-04-29 NOTE — Telephone Encounter (Signed)
I spoke with pt and he is requesting a nebulizer kit for his nebulizer machine. I spoke with SG to see if we could place the order because RA is not here. She agreed and I sent the order to [facility]. The pt would like to know when his order will be sent to them. PCC's please let triage know when this is done so we can call pt.

## 2019-04-29 NOTE — Telephone Encounter (Signed)
Spoke with patient. He is aware that the order has been placed. Stated that he will call [facility] in the morning to get the kit. Advised him to call us back if he needed anything else.   Nothing further needed at time of call.

## 2019-04-30 ENCOUNTER — Telehealth (HOSPITAL_COMMUNITY): Payer: Self-pay | Admitting: *Deleted

## 2019-04-30 DIAGNOSIS — J449 Chronic obstructive pulmonary disease, unspecified: Secondary | ICD-10-CM | POA: Diagnosis not present

## 2019-04-30 NOTE — Telephone Encounter (Signed)
Pt left vm requesting to schedule an appt since his June appt was cancelled due to covid19 precautions. I called pt back no answer.

## 2019-05-22 ENCOUNTER — Other Ambulatory Visit: Payer: Self-pay

## 2019-05-22 DIAGNOSIS — R6889 Other general symptoms and signs: Secondary | ICD-10-CM | POA: Diagnosis not present

## 2019-05-22 DIAGNOSIS — Z20822 Contact with and (suspected) exposure to covid-19: Secondary | ICD-10-CM

## 2019-05-23 ENCOUNTER — Other Ambulatory Visit (HOSPITAL_COMMUNITY): Payer: Self-pay | Admitting: Cardiology

## 2019-05-23 LAB — NOVEL CORONAVIRUS, NAA: SARS-CoV-2, NAA: NOT DETECTED

## 2019-05-25 ENCOUNTER — Other Ambulatory Visit (HOSPITAL_COMMUNITY): Payer: Self-pay | Admitting: *Deleted

## 2019-05-25 NOTE — Telephone Encounter (Signed)
As requested refills returned

## 2019-05-25 NOTE — Telephone Encounter (Signed)
-----   Message from Essie Hart May sent at 05/25/2019  8:33 AM EDT ----- Regarding: Urgent Rx Refill Request PT has contacted our office in regards to a refill on his Rx for Lasix.  Please submit a refill request to the pharmacy on file as soon as possible as he is out of the medication and doesn't have any for this AM's dose.  Thanks!

## 2019-05-28 DIAGNOSIS — J449 Chronic obstructive pulmonary disease, unspecified: Secondary | ICD-10-CM | POA: Diagnosis not present

## 2019-06-09 ENCOUNTER — Telehealth: Payer: Self-pay | Admitting: *Deleted

## 2019-06-09 NOTE — Telephone Encounter (Signed)
Received call from pt seeking to confirm his lab appt this week. Informed him that it is scheduled for for Thursday @ 9am, followed by CT scan @ 10 am. His appt for Dr. Julien Nordmann is the following week, 06/17/19 @ 10:15 am. Pt voiced understanding

## 2019-06-11 ENCOUNTER — Inpatient Hospital Stay: Payer: Medicare Other | Attending: Internal Medicine

## 2019-06-11 ENCOUNTER — Ambulatory Visit (HOSPITAL_COMMUNITY)
Admission: RE | Admit: 2019-06-11 | Discharge: 2019-06-11 | Disposition: A | Discharge status: Home / Self Care | Payer: Medicare Other | Source: Ambulatory Visit | Attending: Internal Medicine | Admitting: Internal Medicine

## 2019-06-11 ENCOUNTER — Other Ambulatory Visit: Payer: Medicare Other

## 2019-06-11 ENCOUNTER — Other Ambulatory Visit: Payer: Self-pay

## 2019-06-11 DIAGNOSIS — I2510 Atherosclerotic heart disease of native coronary artery without angina pectoris: Secondary | ICD-10-CM | POA: Diagnosis not present

## 2019-06-11 DIAGNOSIS — C3411 Malignant neoplasm of upper lobe, right bronchus or lung: Secondary | ICD-10-CM | POA: Diagnosis not present

## 2019-06-11 DIAGNOSIS — I252 Old myocardial infarction: Secondary | ICD-10-CM | POA: Diagnosis not present

## 2019-06-11 DIAGNOSIS — I129 Hypertensive chronic kidney disease with stage 1 through stage 4 chronic kidney disease, or unspecified chronic kidney disease: Secondary | ICD-10-CM | POA: Diagnosis not present

## 2019-06-11 DIAGNOSIS — R0602 Shortness of breath: Secondary | ICD-10-CM | POA: Diagnosis not present

## 2019-06-11 DIAGNOSIS — M1990 Unspecified osteoarthritis, unspecified site: Secondary | ICD-10-CM | POA: Diagnosis not present

## 2019-06-11 DIAGNOSIS — Z87891 Personal history of nicotine dependence: Secondary | ICD-10-CM | POA: Insufficient documentation

## 2019-06-11 DIAGNOSIS — C3490 Malignant neoplasm of unspecified part of unspecified bronchus or lung: Secondary | ICD-10-CM | POA: Diagnosis not present

## 2019-06-11 DIAGNOSIS — N183 Chronic kidney disease, stage 3 unspecified: Secondary | ICD-10-CM | POA: Insufficient documentation

## 2019-06-11 DIAGNOSIS — I700 Atherosclerosis of aorta: Secondary | ICD-10-CM | POA: Insufficient documentation

## 2019-06-11 DIAGNOSIS — Z79899 Other long term (current) drug therapy: Secondary | ICD-10-CM | POA: Insufficient documentation

## 2019-06-11 DIAGNOSIS — Z8719 Personal history of other diseases of the digestive system: Secondary | ICD-10-CM | POA: Insufficient documentation

## 2019-06-11 DIAGNOSIS — R0609 Other forms of dyspnea: Secondary | ICD-10-CM | POA: Insufficient documentation

## 2019-06-11 DIAGNOSIS — R590 Localized enlarged lymph nodes: Secondary | ICD-10-CM | POA: Diagnosis not present

## 2019-06-11 DIAGNOSIS — J449 Chronic obstructive pulmonary disease, unspecified: Secondary | ICD-10-CM | POA: Diagnosis not present

## 2019-06-11 LAB — CBC WITH DIFFERENTIAL (CANCER CENTER ONLY)
Abs Immature Granulocytes: 0.04 10*3/uL (ref 0.00–0.07)
Basophils Absolute: 0 10*3/uL (ref 0.0–0.1)
Basophils Relative: 0 %
Eosinophils Absolute: 0.1 10*3/uL (ref 0.0–0.5)
Eosinophils Relative: 1 %
HCT: 36.2 % — ABNORMAL LOW (ref 39.0–52.0)
Hemoglobin: 11.7 g/dL — ABNORMAL LOW (ref 13.0–17.0)
Immature Granulocytes: 1 %
Lymphocytes Relative: 8 %
Lymphs Abs: 0.7 10*3/uL (ref 0.7–4.0)
MCH: 30.4 pg (ref 26.0–34.0)
MCHC: 32.3 g/dL (ref 30.0–36.0)
MCV: 94 fL (ref 80.0–100.0)
Monocytes Absolute: 0.8 10*3/uL (ref 0.1–1.0)
Monocytes Relative: 9 %
Neutro Abs: 6.9 10*3/uL (ref 1.7–7.7)
Neutrophils Relative %: 81 %
Platelet Count: 146 10*3/uL — ABNORMAL LOW (ref 150–400)
RBC: 3.85 MIL/uL — ABNORMAL LOW (ref 4.22–5.81)
RDW: 13.7 % (ref 11.5–15.5)
WBC Count: 8.5 10*3/uL (ref 4.0–10.5)
nRBC: 0 % (ref 0.0–0.2)

## 2019-06-11 LAB — CMP (CANCER CENTER ONLY)
ALT: 9 U/L (ref 0–44)
AST: 16 U/L (ref 15–41)
Albumin: 3.6 g/dL (ref 3.5–5.0)
Alkaline Phosphatase: 68 U/L (ref 38–126)
Anion gap: 10 (ref 5–15)
BUN: 24 mg/dL — ABNORMAL HIGH (ref 8–23)
CO2: 28 mmol/L (ref 22–32)
Calcium: 9.1 mg/dL (ref 8.9–10.3)
Chloride: 104 mmol/L (ref 98–111)
Creatinine: 1.67 mg/dL — ABNORMAL HIGH (ref 0.61–1.24)
GFR, Est AFR Am: 44 mL/min — ABNORMAL LOW (ref >60)
GFR, Estimated: 38 mL/min — ABNORMAL LOW (ref >60)
Glucose, Bld: 95 mg/dL (ref 70–99)
Potassium: 4.4 mmol/L (ref 3.5–5.1)
Sodium: 142 mmol/L (ref 135–145)
Total Bilirubin: 0.5 mg/dL (ref 0.3–1.2)
Total Protein: 7 g/dL (ref 6.5–8.1)

## 2019-06-17 ENCOUNTER — Other Ambulatory Visit: Payer: Self-pay

## 2019-06-17 ENCOUNTER — Encounter: Payer: Self-pay | Admitting: Internal Medicine

## 2019-06-17 ENCOUNTER — Inpatient Hospital Stay (HOSPITAL_BASED_OUTPATIENT_CLINIC_OR_DEPARTMENT_OTHER): Payer: Medicare Other | Admitting: Internal Medicine

## 2019-06-17 VITALS — BP 124/68 | HR 79 | Temp 98.4°F | Resp 16 | Ht 70.0 in | Wt 189.8 lb

## 2019-06-17 DIAGNOSIS — J449 Chronic obstructive pulmonary disease, unspecified: Secondary | ICD-10-CM | POA: Diagnosis not present

## 2019-06-17 DIAGNOSIS — I2510 Atherosclerotic heart disease of native coronary artery without angina pectoris: Secondary | ICD-10-CM | POA: Diagnosis not present

## 2019-06-17 DIAGNOSIS — M1990 Unspecified osteoarthritis, unspecified site: Secondary | ICD-10-CM | POA: Diagnosis not present

## 2019-06-17 DIAGNOSIS — I129 Hypertensive chronic kidney disease with stage 1 through stage 4 chronic kidney disease, or unspecified chronic kidney disease: Secondary | ICD-10-CM | POA: Diagnosis not present

## 2019-06-17 DIAGNOSIS — C3490 Malignant neoplasm of unspecified part of unspecified bronchus or lung: Secondary | ICD-10-CM

## 2019-06-17 DIAGNOSIS — Z8719 Personal history of other diseases of the digestive system: Secondary | ICD-10-CM | POA: Diagnosis not present

## 2019-06-17 DIAGNOSIS — Z79899 Other long term (current) drug therapy: Secondary | ICD-10-CM | POA: Diagnosis not present

## 2019-06-17 DIAGNOSIS — R0609 Other forms of dyspnea: Secondary | ICD-10-CM | POA: Diagnosis not present

## 2019-06-17 DIAGNOSIS — I10 Essential (primary) hypertension: Secondary | ICD-10-CM | POA: Diagnosis not present

## 2019-06-17 DIAGNOSIS — I252 Old myocardial infarction: Secondary | ICD-10-CM | POA: Diagnosis not present

## 2019-06-17 DIAGNOSIS — I700 Atherosclerosis of aorta: Secondary | ICD-10-CM | POA: Diagnosis not present

## 2019-06-17 DIAGNOSIS — C3411 Malignant neoplasm of upper lobe, right bronchus or lung: Secondary | ICD-10-CM

## 2019-06-17 DIAGNOSIS — R590 Localized enlarged lymph nodes: Secondary | ICD-10-CM | POA: Diagnosis not present

## 2019-06-17 DIAGNOSIS — N183 Chronic kidney disease, stage 3 unspecified: Secondary | ICD-10-CM | POA: Diagnosis not present

## 2019-06-17 DIAGNOSIS — Z87891 Personal history of nicotine dependence: Secondary | ICD-10-CM | POA: Diagnosis not present

## 2019-06-17 NOTE — Progress Notes (Signed)
[facility] [phone]   [phone]  OFFICE PROGRESS NOTE  [name], MD [address] [place] [address]  DIAGNOSIS: Stage IIIA (T2b, N2, M0) non-small cell lung cancer, favoring squamous cell carcinoma presented with right upper lobe lung mass in addition to mediastinal lymphadenopathy diagnosed in March 2017.  PRIOR THERAPY:  A course of concurrent chemoradiation with weekly carboplatin for AUC of 2 and paclitaxel 45 MG/M2. Status post 5 cycle with partial response.  CURRENT THERAPY: Observation  INTERVAL HISTORY: Paul Sandoval. 78 y.o. male returns to the clinic today for 6 months follow-up visit.  The patient is feeling fine today with no concerning complaints except for mild fatigue and shortness of breath with exertion.  He denied having any current chest pain, cough or hemoptysis.  He denied having any recent weight loss or night sweats.  He has no nausea, vomiting, diarrhea or constipation.  He has no headache or visual changes.  He had repeat CT scan of the chest performed recently and he is here for evaluation and discussion of his scan results.   MEDICAL HISTORY: Past Medical History:  Diagnosis Date  . AAA (abdominal aortic aneurysm) ([place]) 2010   4.4 cm 08/2008;4.44 in 7/10 and 4.65 in 08/2009; 4.8 by CT in 11/2009; 4.3 by ultrasound in 08/2010  . Anemia   . Arteriosclerotic cardiovascular disease (ASCVD) 1996   CABG-1996  . Arthritis    "fingers" (03/18/2014)  . CAD (coronary artery disease)    03/18/14:  PCI with DES to distal left main. 7/29: DES to the SVG to Diag  . Cancer ([place])    Upper right lobe lung cancer  . Cardiomyopathy, ischemic    Echo 03/17/14: EF 45-50%  . Chronic bronchitis ([place])   . Chronic kidney disease    CRF  . Chronic rhinitis   . Colonic polyp 2002   polypectomy in 2002  . COPD (chronic obstructive pulmonary disease) ([place])   . Diverticulosis   . Dyspnea    with exertion  .  ED (erectile dysfunction)   . Encounter for antineoplastic chemotherapy 12/19/2015  . GERD (gastroesophageal reflux disease)   . History of blood transfusion   . Hyperlipidemia   . Hypertension   . IFG (impaired fasting glucose)   . Myocardial infarction [facility])    "told h/o silent MI sometime before 1996"  . Pneumonia ~ 2001; ~ 2005    has had more than twice  . Right bundle branch block   . Tobacco abuse, in remission    40 pack year total consumption; discontinued in 1996    ALLERGIES:  is allergic to neomycin.  MEDICATIONS:  Current Outpatient Medications  Medication Sig Dispense Refill  . aspirin EC 81 MG tablet Take 81 mg by mouth daily before breakfast.     . carvedilol (COREG) 12.5 MG tablet Take 1.5 tablets (18.75 mg total) by mouth 2 (two) times daily with a meal. 90 tablet 6  . clopidogrel (PLAVIX) 75 MG tablet TAKE (1) TABLET BY MOUTH ONCE DAILY. 30 tablet 0  . eplerenone (INSPRA) 50 MG tablet Take 1 tablet (50 mg total) by mouth daily. 30 tablet 11  . Ferrous Sulfate (IRON) 28 MG TABS Take 65 mg by mouth daily.     . furosemide (LASIX) 40 MG tablet TAKE (1) TABLET BY MOUTH ONCE DAILY. 90 tablet 3  . ipratropium-albuterol (DUONEB) 0.5-2.5 (3) MG/3ML SOLN INHALE 1 VIAL VIA NEBULIZER  4 TIMES DAILY. 360 mL 3  . methylPREDNISolone (MEDROL) 4 MG tablet Take 4 mg by mouth every other day.    . Multiple Vitamins-Minerals (CENTRUM SILVER ADULT 50+) TABS Take 1 tablet by mouth daily with lunch.     Marland Kitchen omeprazole (PRILOSEC) 20 MG capsule Take 1 capsule (20 mg total) by mouth daily. 90 capsule 3  . PROAIR HFA 108 (90 Base) MCG/ACT inhaler INHALE 2 PUFFS BY MOUTH EVERY 4 TO 6 HOURS AS NEEDED FOR WHEEZING. 8.5 g 0  . sacubitril-valsartan (ENTRESTO) 49-51 MG TAKE (1) TABLET BY MOUTH TWICE DAILY. 60 tablet 11  . simvastatin (ZOCOR) 80 MG tablet Take 0.5 tablets (40 mg total) by mouth at bedtime. 15 tablet 5  . acetaminophen (TYLENOL) 500 MG tablet Take 500 mg by mouth every 6 (six) hours  as needed for mild pain.    . budesonide-formoterol (SYMBICORT) 160-4.5 MCG/ACT inhaler Inhale 2 puffs into the lungs 2 (two) times daily. 3 Inhaler 3  . GuaiFENesin (MUCUS RELIEF ADULT PO) Take 2 capsules by mouth 3 (three) times daily.    . nitroGLYCERIN (NITROSTAT) 0.4 MG SL tablet PLACE 1 TAB UNDER TONGUE EVERY 5 MIN IF NEEDED FOR CHEST PAIN. MAY USE 3 TIMES.NO RELIEF CALL 911. (Patient not taking: Reported on 06/17/2019) 25 tablet 1   No current facility-administered medications for this visit.     SURGICAL HISTORY:  Past Surgical History:  Procedure Laterality Date  . ABDOMINAL AORTIC ANEURYSM REPAIR  11/2012  . ABDOMINAL AORTIC ENDOVASCULAR STENT GRAFT N/A 12/11/2012   Procedure: ABDOMINAL AORTIC ENDOVASCULAR STENT GRAFT;  Surgeon: Mal Misty, MD;  Location: [facility];  Service: Vascular;  Laterality: N/A;  Ultrasound guided; [place]  . CARDIAC CATHETERIZATION  01/08/1995  . COLONOSCOPY  2002   polypectomy-patient denies  . CORONARY ANGIOPLASTY WITH STENT PLACEMENT  03/18/2014   "1"  . CORONARY ANGIOPLASTY WITH STENT PLACEMENT  03/24/2014   "1"  . CORONARY ARTERY BYPASS GRAFT  01/09/1995   "CABG X3"  . FLEXIBLE BRONCHOSCOPY N/A 03/01/2017   Procedure: FLEXIBLE BRONCHOSCOPY WITH BIOPSIES;  Surgeon: Gaye Pollack, MD;  Location: [facility] OR;  Service: Thoracic;  Laterality: N/A;  . JOINT REPLACEMENT    . LAPAROSCOPIC CHOLECYSTECTOMY  12/2009  . LEFT AND RIGHT HEART CATHETERIZATION WITH CORONARY/GRAFT ANGIOGRAM N/A 03/18/2014   Procedure: LEFT AND RIGHT HEART CATHETERIZATION WITH Beatrix Fetters;  Surgeon: Blane Ohara, MD;  Location: [facility] CATH LAB;  Service: Cardiovascular;  Laterality: N/A;  . PERCUTANEOUS CORONARY STENT INTERVENTION (PCI-S)  03/18/2014   Procedure: PERCUTANEOUS CORONARY STENT INTERVENTION (PCI-S);  Surgeon: Blane Ohara, MD;  Location: [facility] CATH LAB;  Service: Cardiovascular;;  . PERCUTANEOUS CORONARY STENT INTERVENTION (PCI-S) N/A 03/24/2014   Procedure:  PERCUTANEOUS CORONARY STENT INTERVENTION (PCI-S);  Surgeon: Blane Ohara, MD;  Location[facility] CATH LAB;  Service: Cardiovascular;  Laterality: N/A;  . PLEURAL EFFUSION DRAINAGE Right 03/01/2017   Procedure: DRAINAGE OF PLEURAL EFFUSION;  Surgeon: Gaye Pollack, MD;  Location: [place];  Service: Thoracic;  Laterality: Right;  . RIGHT/LEFT HEART CATH AND CORONARY/GRAFT ANGIOGRAPHY N/A 05/15/2017   Procedure: RIGHT/LEFT HEART CATH AND CORONARY/GRAFT ANGIOGRAPHY;  Surgeon: Larey Dresser, MD;  Location: [place] CV LAB;  Service: Cardiovascular;  Laterality: N/A;  . TALC PLEURODESIS Right 03/01/2017   Procedure: Pietro Cassis;  Surgeon: Gaye Pollack, MD;  Location: [place];  Service: Thoracic;  Laterality: Right;  . TOTAL HIP ARTHROPLASTY Left 01/21/2013   Procedure: TOTAL HIP ARTHROPLASTY ANTERIOR APPROACH;  Surgeon:  Mauri Pole, MD;  Location: [place];  Service: Orthopedics;  Laterality: Left;  Marland Kitchen VIDEO ASSISTED THORACOSCOPY Right 03/01/2017   Procedure: VIDEO ASSISTED THORACOSCOPY WITH BIOPSIES;  Surgeon: Gaye Pollack, MD;  Location: [place];  Service: Thoracic;  Laterality: Right;  Marland Kitchen VIDEO BRONCHOSCOPY N/A 11/17/2015   Procedure: VIDEO BRONCHOSCOPY WITH FLUORO;  Surgeon: Rigoberto Noel, MD;  Location: [place];  Service: Cardiopulmonary;  Laterality: N/A;    REVIEW OF SYSTEMS:  A comprehensive review of systems was negative except for: Respiratory: positive for cough and dyspnea on exertion   PHYSICAL EXAMINATION: General appearance: alert, cooperative and no distress Head: Normocephalic, without obvious abnormality, atraumatic Neck: no adenopathy, no JVD, supple, symmetrical, trachea midline and thyroid not enlarged, symmetric, no tenderness/mass/nodules Lymph nodes: Cervical, supraclavicular, and axillary nodes normal. Resp: wheezes RLL Back: symmetric, no curvature. ROM normal. No CVA tenderness. Cardio: regular rate and rhythm, S1, S2 normal, no murmur, click, rub or gallop GI:  soft, non-tender; bowel sounds normal; no masses,  no organomegaly Extremities: extremities normal, atraumatic, no cyanosis or edema  ECOG PERFORMANCE STATUS: 1 - Symptomatic but completely ambulatory  Blood pressure 124/68, pulse 79, temperature 98.4 F (36.9 C), temperature source Temporal, resp. rate 16, height _0  (1.778 m), weight 189 lb 12.8 oz (86.1 kg), SpO2 97 %.  LABORATORY DATA: Lab Results  Component Value Date   WBC 8.5 06/11/2019   HGB 11.7 (L) 06/11/2019   HCT 36.2 (L) 06/11/2019   MCV 94.0 06/11/2019   PLT 146 (L) 06/11/2019      Chemistry      Component Value Date/Time   NA 142 06/11/2019 0900   NA 137 05/21/2017 0906   K 4.4 06/11/2019 0900   K 4.1 05/21/2017 0906   CL 104 06/11/2019 0900   CO2 28 06/11/2019 0900   CO2 29 05/21/2017 0906   BUN 24 (H) 06/11/2019 0900   BUN 14.6 05/21/2017 0906   CREATININE 1.67 (H) 06/11/2019 0900   CREATININE 1.2 05/21/2017 0906      Component Value Date/Time   CALCIUM 9.1 06/11/2019 0900   CALCIUM 9.4 05/21/2017 0906   ALKPHOS 68 06/11/2019 0900   ALKPHOS 86 05/21/2017 0906   AST 16 06/11/2019 0900   AST 19 05/21/2017 0906   ALT 9 06/11/2019 0900   ALT 14 05/21/2017 0906   BILITOT 0.5 06/11/2019 0900   BILITOT 0.57 05/21/2017 0906       RADIOGRAPHIC STUDIES: Ct Chest Wo Contrast  Result Date: 06/11/2019 CLINICAL DATA:  Non-small cell lung cancer, follow-up study. Reported shortness of breath and cough. EXAM: CT CHEST WITHOUT CONTRAST TECHNIQUE: Multidetector CT imaging of the chest was performed following the standard protocol without IV contrast. COMPARISON:  12/11/2018 FINDINGS: Cardiovascular: Signs of coronary revascularization with CABG in prior PTCI. Extensive coronary artery calcifications. No signs of pericardial effusion limited assessment of vasculature in the chest secondary to lack of contrast. Atherosclerotic changes throughout a nonaneurysmal thoracic aorta as before. Mediastinum/Nodes:  Subcarinal lymph node measuring 10 mm open parent image 60, series 2) unchanged based on comparison with December 11, 2018 also stable based on comparison with exam of 2019. No sign of mediastinal mass Lungs/Pleura: Loculated right posteroinferior pleural effusion with peripheral calcification and pleural fluid associated with post treatment fibrotic changes in the right upper lobe both displaying no interval change since prior exam. Paramediastinal scarring in the left upper lobe and scattered areas of subpleural reticulation unchanged as well. No new consolidation or effusion. Central airway  thickening in the right chest is stable. Upper Abdomen: Post cholecystectomy. No acute findings in the upper abdomen. The superior most aspect of an aortic stent is partially visualized. Musculoskeletal: No signs of chest wall mass. Postoperative changes of median sternotomy. No acute bone process or destructive bone finding. Similar appearance of T4 compression fracture and signs of spinal degenerative change. IMPRESSION: Stable appearance of mildly enlarged subcarinal lymph node. Stable loculated effusions post talc pleurodesis and paramediastinal and right upper lobe fibrosis post radiation. No signs of metastatic or recurrent disease.  In the Aortic Atherosclerosis (ICD10-I70.0). Electronically Signed   By: Zetta Bills M.D.   On: 06/11/2019 11:55    ASSESSMENT AND PLAN:  This is a very pleasant 78 years old white male with a stage IIIa non-small cell lung cancer status post a course of concurrent chemoradiation with weekly carboplatin and paclitaxel and he had a rough time with the treatment at that time. He did not receive consolidation chemotherapy. He is currently on observation and he is doing fine with no concerning issues. The patient had repeat CT scan of the chest performed recently.  I personally and independently reviewed the scans and discussed the results with the patient today. His scan showed no  concerning findings for disease progression. I recommended for him to continue on observation with repeat CT scan of the chest without contrast in 6 months. He was advised to call immediately if he has any concerning symptoms in the interval. The patient voices understanding of current disease status and treatment options and is in agreement with the current care plan. All questions were answered. The patient knows to call the clinic with any problems, questions or concerns. We can certainly see the patient much sooner if necessary. I spent 10 minutes counseling the patient face to face. The total time spent in the appointment was 15 minutes.  Disclaimer: This note was dictated with voice recognition software. Similar sounding words can inadvertently be transcribed and may not be corrected upon review.

## 2019-06-18 ENCOUNTER — Telehealth: Payer: Self-pay | Admitting: Internal Medicine

## 2019-06-18 NOTE — Telephone Encounter (Signed)
Scheduled appt per 10/21 los.  Spoke with pt and he is aware of his scheduled appt date and time.

## 2019-06-22 ENCOUNTER — Other Ambulatory Visit (HOSPITAL_COMMUNITY): Payer: Self-pay | Admitting: Cardiology

## 2019-06-22 DIAGNOSIS — C3411 Malignant neoplasm of upper lobe, right bronchus or lung: Secondary | ICD-10-CM

## 2019-06-22 MED ORDER — CARVEDILOL 12.5 MG PO TABS: 18.7500 mg | ORAL_TABLET | Freq: Two times a day (BID) | ORAL | 6 refills | Status: DC

## 2019-06-28 DIAGNOSIS — J449 Chronic obstructive pulmonary disease, unspecified: Secondary | ICD-10-CM | POA: Diagnosis not present

## 2019-07-06 ENCOUNTER — Ambulatory Visit (HOSPITAL_COMMUNITY)
Admission: RE | Admit: 2019-07-06 | Discharge: 2019-07-06 | Disposition: A | Discharge status: Home / Self Care | Payer: Medicare Other | Source: Ambulatory Visit | Attending: Cardiology | Admitting: Cardiology

## 2019-07-06 ENCOUNTER — Other Ambulatory Visit: Payer: Self-pay

## 2019-07-06 ENCOUNTER — Encounter (HOSPITAL_COMMUNITY): Payer: Self-pay | Admitting: Cardiology

## 2019-07-06 VITALS — BP 132/70 | HR 71 | Wt 189.0 lb

## 2019-07-06 DIAGNOSIS — N183 Chronic kidney disease, stage 3 unspecified: Secondary | ICD-10-CM | POA: Diagnosis not present

## 2019-07-06 DIAGNOSIS — Z8249 Family history of ischemic heart disease and other diseases of the circulatory system: Secondary | ICD-10-CM | POA: Insufficient documentation

## 2019-07-06 DIAGNOSIS — I5022 Chronic systolic (congestive) heart failure: Secondary | ICD-10-CM | POA: Diagnosis not present

## 2019-07-06 DIAGNOSIS — Z7902 Long term (current) use of antithrombotics/antiplatelets: Secondary | ICD-10-CM | POA: Diagnosis not present

## 2019-07-06 DIAGNOSIS — C3490 Malignant neoplasm of unspecified part of unspecified bronchus or lung: Secondary | ICD-10-CM | POA: Diagnosis not present

## 2019-07-06 DIAGNOSIS — Z7951 Long term (current) use of inhaled steroids: Secondary | ICD-10-CM | POA: Diagnosis not present

## 2019-07-06 DIAGNOSIS — Z87891 Personal history of nicotine dependence: Secondary | ICD-10-CM | POA: Insufficient documentation

## 2019-07-06 DIAGNOSIS — I5042 Chronic combined systolic (congestive) and diastolic (congestive) heart failure: Secondary | ICD-10-CM | POA: Diagnosis not present

## 2019-07-06 DIAGNOSIS — Z7982 Long term (current) use of aspirin: Secondary | ICD-10-CM | POA: Diagnosis not present

## 2019-07-06 DIAGNOSIS — Z7984 Long term (current) use of oral hypoglycemic drugs: Secondary | ICD-10-CM | POA: Insufficient documentation

## 2019-07-06 DIAGNOSIS — I130 Hypertensive heart and chronic kidney disease with heart failure and stage 1 through stage 4 chronic kidney disease, or unspecified chronic kidney disease: Secondary | ICD-10-CM | POA: Diagnosis not present

## 2019-07-06 DIAGNOSIS — I4510 Unspecified right bundle-branch block: Secondary | ICD-10-CM | POA: Insufficient documentation

## 2019-07-06 DIAGNOSIS — Z955 Presence of coronary angioplasty implant and graft: Secondary | ICD-10-CM | POA: Diagnosis not present

## 2019-07-06 DIAGNOSIS — I255 Ischemic cardiomyopathy: Secondary | ICD-10-CM | POA: Insufficient documentation

## 2019-07-06 DIAGNOSIS — I2510 Atherosclerotic heart disease of native coronary artery without angina pectoris: Secondary | ICD-10-CM | POA: Diagnosis not present

## 2019-07-06 DIAGNOSIS — E785 Hyperlipidemia, unspecified: Secondary | ICD-10-CM | POA: Insufficient documentation

## 2019-07-06 DIAGNOSIS — Z801 Family history of malignant neoplasm of trachea, bronchus and lung: Secondary | ICD-10-CM | POA: Insufficient documentation

## 2019-07-06 DIAGNOSIS — J449 Chronic obstructive pulmonary disease, unspecified: Secondary | ICD-10-CM | POA: Diagnosis not present

## 2019-07-06 DIAGNOSIS — I6523 Occlusion and stenosis of bilateral carotid arteries: Secondary | ICD-10-CM | POA: Insufficient documentation

## 2019-07-06 DIAGNOSIS — Z79899 Other long term (current) drug therapy: Secondary | ICD-10-CM | POA: Insufficient documentation

## 2019-07-06 DIAGNOSIS — Z923 Personal history of irradiation: Secondary | ICD-10-CM | POA: Diagnosis not present

## 2019-07-06 DIAGNOSIS — J90 Pleural effusion, not elsewhere classified: Secondary | ICD-10-CM | POA: Diagnosis not present

## 2019-07-06 DIAGNOSIS — Z9221 Personal history of antineoplastic chemotherapy: Secondary | ICD-10-CM | POA: Insufficient documentation

## 2019-07-06 DIAGNOSIS — I25810 Atherosclerosis of coronary artery bypass graft(s) without angina pectoris: Secondary | ICD-10-CM | POA: Insufficient documentation

## 2019-07-06 LAB — BASIC METABOLIC PANEL
Anion gap: 9 (ref 5–15)
BUN: 20 mg/dL (ref 8–23)
CO2: 29 mmol/L (ref 22–32)
Calcium: 9 mg/dL (ref 8.9–10.3)
Chloride: 100 mmol/L (ref 98–111)
Creatinine, Ser: 1.65 mg/dL — ABNORMAL HIGH (ref 0.61–1.24)
GFR calc Af Amer: 45 mL/min — ABNORMAL LOW (ref >60)
GFR calc non Af Amer: 39 mL/min — ABNORMAL LOW (ref >60)
Glucose, Bld: 98 mg/dL (ref 70–99)
Potassium: 4 mmol/L (ref 3.5–5.1)
Sodium: 138 mmol/L (ref 135–145)

## 2019-07-06 LAB — LIPID PANEL
Cholesterol: 147 mg/dL (ref 0–200)
HDL: 36 mg/dL — ABNORMAL LOW (ref >40)
LDL Cholesterol: 91 mg/dL (ref 0–99)
Total CHOL/HDL Ratio: 4.1 RATIO
Triglycerides: 101 mg/dL (ref <150)
VLDL: 20 mg/dL (ref 0–40)

## 2019-07-06 MED ORDER — DAPAGLIFLOZIN PROPANEDIOL 10 MG PO TABS: 10.0000 mg | ORAL_TABLET | Freq: Every day | ORAL | 6 refills | Status: DC

## 2019-07-06 NOTE — Patient Instructions (Addendum)
START Farxiga 10mg  ( 1 tab) daily  Labs today and repeat in 2 weeks We will only contact you if something comes back abnormal or we need to make some changes. Otherwise no news is good news!   Your physician has requested that you have an echocardiogram. Echocardiography is a painless test that uses sound waves to create images of your heart. It provides your doctor with information about the size and shape of your heart and how well your heart's chambers and valves are working. This procedure takes approximately one hour. There are no restrictions for this procedure.  Your physician recommends that you schedule a follow-up appointment in: 3 months with an ECHO  At the [facility], you and your health needs are our priority. As part of our continuing mission to provide you with exceptional heart care, we have created designated Provider Care Teams. These Care Teams include your primary Cardiologist (physician) and Advanced Practice Providers (APPs- Physician Assistants and Nurse Practitioners) who all work together to provide you with the care you need, when you need it.   You may see any of the following providers on your designated Care Team at your next follow up: Marland Kitchen Dr Glori Bickers . Dr Loralie Champagne . Darrick Grinder, NP . Lyda Jester, PA   Please be sure to bring in all your medications bottles to every appointment.

## 2019-07-06 NOTE — Progress Notes (Signed)
PCP: Dr. Wolfgang Phoenix Pulmonary: Dr. Lenna Gilford HF Cardiology: Dr. Aundra Dubin  78 y.o. with history of COPD, CAD s/p CABG in 1996 and PCI to dLM and SVG-D in 7/15, ischemic cardiomyopathy, and non-small cell lung cancer presents for followup of CHF and dyspnea.  He was initially referred to [place] by Dr. Lenna Gilford after fall in EF and worsening dyspnea was noted.   Patient reports that his initial symptoms prior to CABG in 1996 were dyspnea.  He has never had any worrisome chest pain.  He did well initially after CABG and was golfing regularly and walking 2-3 miles/day up until around 3/17.  He had one episode of increased dyspnea in 2015 and had cath with PCI to distal LM and SVG-D.  In 3/17, he was diagnosed with NSCLC and was treated with radiation and chemotherapy.  Since then, he has had progressive exertional dyspnea.  He was noted on echo in 7/18 to have EF down to 20-25%.    RHC/LHC was done in 9/18, showing occluded SVG-small D, patent LIMA-LAD and SVG-PDA, 50% shelf-like stenosis distal left main (no intervention), filling pressure not elevated and cardiac output preserved.   I increased his Entresto to 49/51 bid but BP dropped and he had orthostatic symptoms, so I decreased it back to 24/26 bid.   Echo in 8/19 showed that EF remains 30-35%.    Patient returns for followup of CAD and CHF.  He has been stable recently.  No progression of lung cancer per his oncologist.  BP stable, no lightheadedness.  He is able to mow his grass and use the leaf blower.  He can walk around stores without dyspnea.  No problems walking on flat ground.  Short of breath with strenuous activity.  No orthopnea/PND.  No chest pain.   Labs (9/17): LDL 90 Labs (7/18): K 3.5, creatinine 1.28, hgb 10.7 Labs (8/18): LDL 68 Labs (9/18): K 4.1, creatinine 1.2, hgb 11.1, plts 98 K Labs (10/18): K 4.1, creatinine 1.5 Labs (1/19): K 4.3, creatinine 1.54, LDL 80, HDL 49, LFTs normal, TSH normal Labs (4/19): K 4.3, creatinine 1.52, hgb  11.8 Labs (7/19): K 4.1, creatinine 1.5, LDL 75, LFTs normal, hgb 12.2 Labs (10/19): K 4.3, creatinine 1.53 Labs (12/19): K 4.1, creatinine 1.49 Labs (10/20): K 4.4, creatinine 1.67  PMH: 1. COPD: PFTs (6/17) with severe obstructive airways disease.  2. AAA: s/p repair with stent graft.  3. Non-small cell lung cancer: Stage IIIA, diagnosed 3/17.  He had radiation as well as chemotherapy with carboplatin and paclitaxel.   4. CKD stage 3 5. PNA in 6/17 6. Recurrent right pleural effusion: VATS with talc pleurodesis on right.  No malignant cells noted.  7. HTN 8. H/o THR 9. CAD: CABG 1996 with SVG-D, SVG-PDA, LIMA-LAD.   - LHC (7/15): Totally occluded RCA and LAD.  Severe distal LM stenosis.  Patent SVG-PDA, patent LIMA-LAD.  Severe disease SVG-D.  Patient had DES to distal left main and staged PCI of SVG-D.   - Cardiolite (7/17): EF 30%, prior infarction with no ischemia.  - LHC (9/18): occluded SVG-small D, patent LIMA-LAD and SVG-PDA, 50% shelf-like stenosis distal left main (no intervention).  10. Chronic systolic CHF: Ischemic cardiomyopathy.   - Echo (6/17): EF 30-35%. - Echo (7/18): EF 20-25%, moderate RV dilation with mildly decreased RV systolic function.  - RHC (9/18): mean RA 3, PA 28/9 mean 18, mean PCWP 10, CI 4.05.  - Echo (8/19): EF 30-35%, mildly decreased RV systolic function.  11. Carotid stenosis:  Carotid dopplers (7/20) with 40-59% BICA stenosis.   Social History   Socioeconomic History  . Marital status: Married    Spouse name: Not on file  . Number of children: 1  . Years of education: Not on file  . Highest education level: Not on file  Occupational History  . Occupation: Retired    Comment: [employer]  . Financial resource strain: Not on file  . Food insecurity    Worry: Not on file    Inability: Not on file  . Transportation needs    Medical: Not on file    Non-medical: Not on file  Tobacco Use  . Smoking status: Former  Smoker    Packs/day: 1.50    Years: 30.00    Pack years: 45.00    Types: Cigarettes    Start date: 12/01/1956    Quit date: 01/08/1995    Years since quitting: 24.5  . Smokeless tobacco: Never Used  Substance and Sexual Activity  . Alcohol use: No    Alcohol/week: 0.0 standard drinks    Frequency: Never    Comment: 03/18/2014 "no alacohol since 1996"  . Drug use: No  . Sexual activity: Never  Lifestyle  . Physical activity    Days per week: Not on file    Minutes per session: Not on file  . Stress: Not on file  Relationships  . Social Herbalist on phone: Not on file    Gets together: Not on file    Attends religious service: Not on file    Active member of club or organization: Not on file    Attends meetings of clubs or organizations: Not on file    Relationship status: Not on file  . Intimate partner violence    Fear of current or ex partner: Not on file    Emotionally abused: Not on file    Physically abused: Not on file    Forced sexual activity: Not on file  Other Topics Concern  . Not on file  Social History Narrative  . Not on file   Family History  Problem Relation Age of Onset  . Heart disease Father   . Cancer Father        Lung  . Arthritis Mother   . Parkinsonism Mother   . Arthritis Sister        Brother with rheumatoid arthritis  . Hypertension Brother   . Colon cancer Neg Hx   . Colon polyps Neg Hx    ROS: All systems reviewed and negative except as per HPI.   Current Outpatient Medications  Medication Sig Dispense Refill  . acetaminophen (TYLENOL) 500 MG tablet Take 500 mg by mouth every 6 (six) hours as needed for mild pain.    Marland Kitchen aspirin EC 81 MG tablet Take 81 mg by mouth daily before breakfast.     . budesonide-formoterol (SYMBICORT) 160-4.5 MCG/ACT inhaler Inhale 2 puffs into the lungs 2 (two) times daily. 3 Inhaler 3  . carvedilol (COREG) 12.5 MG tablet Take 1.5 tablets (18.75 mg total) by mouth 2 (two) times daily with a meal. 90  tablet 6  . clopidogrel (PLAVIX) 75 MG tablet TAKE (1) TABLET BY MOUTH ONCE DAILY. 30 tablet 3  . eplerenone (INSPRA) 50 MG tablet Take 1 tablet (50 mg total) by mouth daily. 30 tablet 11  . furosemide (LASIX) 40 MG tablet TAKE (1) TABLET BY MOUTH ONCE DAILY. 90 tablet 3  . GuaiFENesin (MUCUS RELIEF  ADULT PO) Take 2 capsules by mouth 3 (three) times daily.    Marland Kitchen ipratropium-albuterol (DUONEB) 0.5-2.5 (3) MG/3ML SOLN INHALE 1 VIAL VIA NEBULIZER 4 TIMES DAILY. 360 mL 3  . methylPREDNISolone (MEDROL) 4 MG tablet Take 4 mg by mouth every other day. Monday    . Multiple Vitamins-Minerals (CENTRUM SILVER ADULT 50+) TABS Take 1 tablet by mouth daily with lunch.     . nitroGLYCERIN (NITROSTAT) 0.4 MG SL tablet PLACE 1 TAB UNDER TONGUE EVERY 5 MIN IF NEEDED FOR CHEST PAIN. MAY USE 3 TIMES.NO RELIEF CALL 911. 25 tablet 1  . omeprazole (PRILOSEC) 20 MG capsule Take 1 capsule (20 mg total) by mouth daily. 90 capsule 3  . PROAIR HFA 108 (90 Base) MCG/ACT inhaler INHALE 2 PUFFS BY MOUTH EVERY 4 TO 6 HOURS AS NEEDED FOR WHEEZING. 8.5 g 0  . sacubitril-valsartan (ENTRESTO) 49-51 MG TAKE (1) TABLET BY MOUTH TWICE DAILY. 60 tablet 11  . simvastatin (ZOCOR) 80 MG tablet Take 0.5 tablets (40 mg total) by mouth at bedtime. 15 tablet 5  . dapagliflozin propanediol (FARXIGA) 10 MG TABS tablet Take 10 mg by mouth daily before breakfast. 30 tablet 6   No current facility-administered medications for this encounter.    BP 132/70   Pulse 71   Wt 85.7 kg (189 lb)   SpO2 97%   BMI 27.12 kg/m  General: NAD Neck: No JVD, no thyromegaly or thyroid nodule.  Lungs: Prolonged expiratory phase.  CV: Nondisplaced PMI.  Heart regular S1/S2, no S3/S4, no murmur.  Trace ankle edema.  No carotid bruit.  Normal pedal pulses.  Abdomen: Soft, nontender, no hepatosplenomegaly, no distention.  Skin: Intact without lesions or rashes.  Neurologic: Alert and oriented x 3.  Psych: Normal affect. Extremities: No clubbing or cyanosis.   HEENT: Normal.   Assessment/Plan: 1. Chronic systolic CHF: Ischemic cardiomyopathy.  Echo in 7/18 with EF 20-25%, echo 8/19 with EF 30-35%.  Generally, NYHA class II symptoms.  He is not volume overloaded on exam.  - Continue Entresto 49/51 bid.  - Continue Lasix 40 mg daily.    - Continue Coreg 18.75 mg bid.   - Continue eplerenone 50 mg daily.   - Start dapagliflozin for CHF and CKD.  BMET today and again in 2 wks.  - Echo at followup in 3 months.  - EF persistently low, qualifies for ICD.  He has a RBBB so not good CRT candidate.  Lung cancer has been quiescent and oncologic prognosis seems reasonably positive, so ICD would be reasonable for him.  He is currently not interested in getting an ICD.   2. CAD: s/p CABG then PCI 7/15 to distal LM and SVG-D.  Anginal equivalent in the past appears to have been dyspnea, he has never had significant chest pain.  LHC (9/18) showed patent LIMA-LAD and SVG-PDA; occluded SVG-D; 50% shelf-like LM stenosis - Continue ASA 81, Plavix, and simvastatin.  3. COPD: Severe COPD by PFTs in 6/17.  Based on the last RHC/LHC, I suspect that COPD plays a major role in his mild ongoing dyspnea.   4. Hyperlipidemia: Check lipids today.  5. NSCLC: Follows with Dr. Earlie Server.  He has finished chemo/radiation. Cancer appeared quiescent on most recent scans with Dr. Earlie Server.   6. Right pleural effusion: This has been chronic. S/p VATS.  7. Carotid stenosis: Repeat carotid dopplers in 7/21.   Followup in 3 mos with echo.   Loralie Champagne 07/06/2019

## 2019-07-07 ENCOUNTER — Telehealth (HOSPITAL_COMMUNITY): Payer: Self-pay | Admitting: Pharmacy Technician

## 2019-07-07 ENCOUNTER — Telehealth (HOSPITAL_COMMUNITY): Payer: Self-pay

## 2019-07-07 DIAGNOSIS — E7849 Other hyperlipidemia: Secondary | ICD-10-CM

## 2019-07-07 MED ORDER — ROSUVASTATIN CALCIUM 20 MG PO TABS: 20.0000 mg | ORAL_TABLET | Freq: Every day | ORAL | 3 refills | Status: DC

## 2019-07-07 NOTE — Telephone Encounter (Signed)
Patient Advocate Encounter   Received notification from [employer] that prior authorization for Farxiga 10mg  is required.   PA submitted on CoverMyMeds Key [id number] Status is pending   Will continue to follow.  Charlann Boxer, CPhT

## 2019-07-07 NOTE — Telephone Encounter (Signed)
Pt aware of lab results and med changes. Advised to stop zocor and start crestor.  Verbalized understanding. Appt made for 2 months for repeat lipids/lfts. appt card mailed with copy of lab work

## 2019-07-07 NOTE — Telephone Encounter (Signed)
-----   Message from Larey Dresser, MD sent at 07/06/2019  5:45 PM EST ----- Would like to see LDL < 70.  Stop Zocor, start Crestor 20 mg daily. Lipids/LFTs in 2 months.

## 2019-07-08 ENCOUNTER — Other Ambulatory Visit (HOSPITAL_COMMUNITY): Payer: Self-pay

## 2019-07-08 MED ORDER — DAPAGLIFLOZIN PROPANEDIOL 10 MG PO TABS: 10.0000 mg | ORAL_TABLET | Freq: Every day | ORAL | 6 refills | Status: DC

## 2019-07-15 ENCOUNTER — Telehealth: Payer: Self-pay | Admitting: *Deleted

## 2019-07-15 NOTE — Telephone Encounter (Signed)
Oncology Nurse Navigator Documentation  Oncology Nurse Navigator Flowsheets 07/15/2019  Abnormal Finding Date -  Confirmed Diagnosis Date -  Navigator Location [facility]  Referral Date to RadOnc/MedOnc -  Navigator Encounter Type Telephone/Patient called and left me a vm message needing me to call. I called him back.  He needs assistance with medical forms.  I updated him on phone number to call for assistance. He was thankful for the help.   Telephone Incoming Call;Outgoing Call  Patient Visit Type -  Treatment Phase Follow-up  Barriers/Navigation Needs Education  Education Other  Interventions Education  Acuity Level 2-Minimal Needs (1-2 Barriers Identified)  Coordination of Care -  Education Method Verbal  Time Spent with Patient 15

## 2019-07-16 NOTE — Telephone Encounter (Signed)
Advanced Heart Failure Patient Advocate Encounter  Prior Authorization for [place] has been approved.    PA# [id number] Effective dates: 07/08/2019 through 08/26/2020  Patients co-pay is $150.00   Called to start application for patient assistance through [place], left message.  Will follow up.  Charlann Boxer, CPhT

## 2019-07-18 ENCOUNTER — Telehealth: Payer: Self-pay | Admitting: Pulmonary Disease

## 2019-07-18 NOTE — Telephone Encounter (Signed)
Patient called complaining of cough with green sputum for 1 day. No fevers, no dyspnea or wheezing.  He has been social distancing and masking during the pandemic, lives with his wife, she is well  Doxycycline 100 mg daily for 7 days to start taking only if green sputum persists -called into [place] particularly If breathing gets worse, he will call the office for chest x-ray

## 2019-07-20 ENCOUNTER — Other Ambulatory Visit (HOSPITAL_COMMUNITY): Payer: Self-pay

## 2019-07-20 ENCOUNTER — Telehealth (HOSPITAL_COMMUNITY): Payer: Self-pay | Admitting: *Deleted

## 2019-07-20 ENCOUNTER — Other Ambulatory Visit: Payer: Self-pay

## 2019-07-20 ENCOUNTER — Telehealth (HOSPITAL_COMMUNITY): Payer: Self-pay

## 2019-07-20 ENCOUNTER — Ambulatory Visit (HOSPITAL_COMMUNITY)
Admission: RE | Admit: 2019-07-20 | Discharge: 2019-07-20 | Disposition: A | Discharge status: Home / Self Care | Payer: Medicare Other | Source: Ambulatory Visit | Attending: Cardiology | Admitting: Cardiology

## 2019-07-20 DIAGNOSIS — I5042 Chronic combined systolic (congestive) and diastolic (congestive) heart failure: Secondary | ICD-10-CM | POA: Diagnosis not present

## 2019-07-20 LAB — BASIC METABOLIC PANEL
Anion gap: 7 (ref 5–15)
BUN: 19 mg/dL (ref 8–23)
CO2: 27 mmol/L (ref 22–32)
Calcium: 8.7 mg/dL — ABNORMAL LOW (ref 8.9–10.3)
Chloride: 102 mmol/L (ref 98–111)
Creatinine, Ser: 1.77 mg/dL — ABNORMAL HIGH (ref 0.61–1.24)
GFR calc Af Amer: 41 mL/min — ABNORMAL LOW (ref >60)
GFR calc non Af Amer: 36 mL/min — ABNORMAL LOW (ref >60)
Glucose, Bld: 110 mg/dL — ABNORMAL HIGH (ref 70–99)
Potassium: 4.4 mmol/L (ref 3.5–5.1)
Sodium: 136 mmol/L (ref 135–145)

## 2019-07-20 MED ORDER — DAPAGLIFLOZIN PROPANEDIOL 10 MG PO TABS: 10.0000 mg | ORAL_TABLET | Freq: Every day | ORAL | 6 refills | Status: DC

## 2019-07-20 NOTE — Telephone Encounter (Signed)
Labs mailed to pts home as requested.

## 2019-07-20 NOTE — Telephone Encounter (Signed)
Patient called front desk to speak with a nurse about a Rx. I returned the call. He needed a Fax sent to the [facility] stating the reason for his new Rx for [place]. Faxed last ov note to [phone], attention Dr. Nathanial Rancher.

## 2019-07-21 NOTE — Telephone Encounter (Signed)
Patient was in clinic yesterday and stated he was getting the medications from the [place] so that it would be covered.  No need for assistance at this time.  Charlann Boxer, CPhT

## 2019-07-28 DIAGNOSIS — J449 Chronic obstructive pulmonary disease, unspecified: Secondary | ICD-10-CM | POA: Diagnosis not present

## 2019-07-31 ENCOUNTER — Telehealth: Payer: Self-pay | Admitting: Internal Medicine

## 2019-07-31 NOTE — Telephone Encounter (Signed)
Printed medical records for patient request, patient will pick up records, Release [id number]

## 2019-08-03 ENCOUNTER — Other Ambulatory Visit: Payer: Self-pay

## 2019-08-03 ENCOUNTER — Ambulatory Visit (HOSPITAL_COMMUNITY)
Admission: RE | Admit: 2019-08-03 | Discharge: 2019-08-03 | Disposition: A | Discharge status: Home / Self Care | Payer: Medicare Other | Source: Ambulatory Visit | Attending: Internal Medicine | Admitting: Internal Medicine

## 2019-08-03 ENCOUNTER — Telehealth (HOSPITAL_COMMUNITY): Payer: Self-pay

## 2019-08-03 DIAGNOSIS — I5042 Chronic combined systolic (congestive) and diastolic (congestive) heart failure: Secondary | ICD-10-CM

## 2019-08-03 LAB — BASIC METABOLIC PANEL
Anion gap: 9 (ref 5–15)
BUN: 23 mg/dL (ref 8–23)
CO2: 26 mmol/L (ref 22–32)
Calcium: 9 mg/dL (ref 8.9–10.3)
Chloride: 101 mmol/L (ref 98–111)
Creatinine, Ser: 1.93 mg/dL — ABNORMAL HIGH (ref 0.61–1.24)
GFR calc Af Amer: 37 mL/min — ABNORMAL LOW (ref >60)
GFR calc non Af Amer: 32 mL/min — ABNORMAL LOW (ref >60)
Glucose, Bld: 155 mg/dL — ABNORMAL HIGH (ref 70–99)
Potassium: 4.2 mmol/L (ref 3.5–5.1)
Sodium: 136 mmol/L (ref 135–145)

## 2019-08-03 NOTE — Telephone Encounter (Signed)
Patient advised and will get labs done today

## 2019-08-03 NOTE — Telephone Encounter (Signed)
OK to continue Farxiga for now but get BMET to make sure creatinine is not higher.

## 2019-08-03 NOTE — Telephone Encounter (Signed)
Orders Placed This Encounter  Procedures  . Basic Metabolic Panel (BMET)    Standing Status:   Future    Standing Expiration Date:   08/02/2020

## 2019-08-03 NOTE — Telephone Encounter (Signed)
Returned call to patient as he reports he has concerns for his BP.  He reports that he started [place] in November and believes it is causing his BP to drop.  He says his baseline is 118/54.  Since then, he noticed that his BP drops to 100's/47-51 2 hours after taking all morning meds. At night, he reports BP is 108/52. I advised he is on other medications that can lower his BP. He takes those medications twice a day.  He does not report any symptoms of low BP however needed reassurance that he can continue farxiga.  He wanted reassurance from MD.  Durene Cal to MD for review.

## 2019-08-05 ENCOUNTER — Telehealth (HOSPITAL_COMMUNITY): Payer: Self-pay

## 2019-08-05 DIAGNOSIS — I5042 Chronic combined systolic (congestive) and diastolic (congestive) heart failure: Secondary | ICD-10-CM

## 2019-08-05 MED ORDER — FUROSEMIDE 40 MG PO TABS: 20.0000 mg | ORAL_TABLET | Freq: Every day | ORAL | 3 refills | Status: DC

## 2019-08-05 NOTE — Telephone Encounter (Signed)
Pt aware of results. Will cut lasix in half and will repeat bmet on 12/21. Verbalized understanding.

## 2019-08-05 NOTE — Telephone Encounter (Signed)
-----   Message from Larey Dresser, MD sent at 08/04/2019 10:12 PM EST ----- Decrease Lasix to 20 mg daily with increased creatinine and repeat BMET in 10 days.  Probably does not need as high a Lasix dose with initiation of Farxiga.

## 2019-08-11 ENCOUNTER — Telehealth: Payer: Self-pay | Admitting: Family Medicine

## 2019-08-11 NOTE — Telephone Encounter (Signed)
Pt contacted and nurse went over talking points with pt. Pt verbalized understanding.

## 2019-08-11 NOTE — Telephone Encounter (Signed)
Patient wants to now if he should get Covid -19 shot and where can he go to get it because he has underline conditions.Please Advise

## 2019-08-11 NOTE — Telephone Encounter (Signed)
See talking points

## 2019-08-11 NOTE — Telephone Encounter (Signed)
Please advise. Thank you

## 2019-08-12 ENCOUNTER — Telehealth (HOSPITAL_COMMUNITY): Payer: Self-pay | Admitting: *Deleted

## 2019-08-12 MED ORDER — FUROSEMIDE 40 MG PO TABS: 40.0000 mg | ORAL_TABLET | Freq: Every day | ORAL | 3 refills | Status: DC

## 2019-08-12 NOTE — Telephone Encounter (Signed)
Increase Lasix back to 40 mg daily.

## 2019-08-12 NOTE — Telephone Encounter (Signed)
Pt called stating his feet and ankles are swollen since starting farxiga and decreasing lasxi from 40mg  to 20mg  daily. Pt wants to know if he can increase lasix  Routed to [place] for advice

## 2019-08-12 NOTE — Telephone Encounter (Signed)
Pt aware to increase 40mg  daily.  Also advised to elevate legs while sitting up during the day.  Pt reports that swelling is worse during the day. Verbalized understanding.  Will rtc on 12/21 for repeat bmet

## 2019-08-17 ENCOUNTER — Other Ambulatory Visit: Payer: Self-pay

## 2019-08-17 ENCOUNTER — Telehealth: Payer: Self-pay | Admitting: Pulmonary Disease

## 2019-08-17 ENCOUNTER — Ambulatory Visit (HOSPITAL_COMMUNITY)
Admission: RE | Admit: 2019-08-17 | Discharge: 2019-08-17 | Disposition: A | Discharge status: Home / Self Care | Payer: Medicare Other | Source: Ambulatory Visit | Attending: Internal Medicine | Admitting: Internal Medicine

## 2019-08-17 DIAGNOSIS — I5042 Chronic combined systolic (congestive) and diastolic (congestive) heart failure: Secondary | ICD-10-CM | POA: Insufficient documentation

## 2019-08-17 LAB — BASIC METABOLIC PANEL
Anion gap: 9 (ref 5–15)
BUN: 19 mg/dL (ref 8–23)
CO2: 28 mmol/L (ref 22–32)
Calcium: 9 mg/dL (ref 8.9–10.3)
Chloride: 102 mmol/L (ref 98–111)
Creatinine, Ser: 1.7 mg/dL — ABNORMAL HIGH (ref 0.61–1.24)
GFR calc Af Amer: 43 mL/min — ABNORMAL LOW (ref >60)
GFR calc non Af Amer: 38 mL/min — ABNORMAL LOW (ref >60)
Glucose, Bld: 105 mg/dL — ABNORMAL HIGH (ref 70–99)
Potassium: 4.1 mmol/L (ref 3.5–5.1)
Sodium: 139 mmol/L (ref 135–145)

## 2019-08-17 NOTE — Telephone Encounter (Signed)
Spoke with pt, he states he is taking Medrol every Monday and was told that it would reassessed when his Rx runs out. He is wanting to know if he should continue at this dose or does he need to stop all together. Please advise.    Take Mucinex tablets when you are producing phlegm -2 tablets twice daily    Patient Instructions by Rigoberto Noel, MD at 04/02/2019 1:30 PM Author: Rigoberto Noel, MD Author Type: Physician Filed: 04/02/2019 1:57 PM  Note Status: Signed Cosign: Cosign Not Required Encounter Date: 04/02/2019  Editor: Rigoberto Noel, MD (Physician)    Continue on Symbicort-2 puffs twice daily. Decrease Medrol tablet to 4 mg every Monday and Friday -starting November, if no worse, can decrease to 4 mg only on Mondays  Take Florajen 3 OTC only when you take antibiotics Take Mucinex tablets when you are producing phlegm -2 tablets twice daily

## 2019-08-18 NOTE — Telephone Encounter (Signed)
Okay to discontinue this and see how he does

## 2019-08-18 NOTE — Telephone Encounter (Signed)
atc pt, line rang X90 seconds with no answer and no vm.  Wcb.

## 2019-08-18 NOTE — Telephone Encounter (Signed)
Pt calling still waiting on an answer about his medication he is aout and needs to kow if there is going to be a refill or is the Dr taking him off of itplease advise pt can be reached @ [phone].Hillery Hunter

## 2019-08-20 NOTE — Telephone Encounter (Signed)
Called and spoke with pt letting him know the info stated by RA.pt said he will discuss this further with RA at upcoming [place] 12/29. Nothing further needed.

## 2019-08-25 ENCOUNTER — Ambulatory Visit: Payer: Medicare Other | Admitting: Pulmonary Disease

## 2019-08-27 ENCOUNTER — Other Ambulatory Visit (HOSPITAL_COMMUNITY): Payer: Self-pay | Admitting: Cardiology

## 2019-08-27 MED ORDER — DAPAGLIFLOZIN PROPANEDIOL 10 MG PO TABS: 10.0000 mg | ORAL_TABLET | Freq: Every day | ORAL | 11 refills | Status: AC

## 2019-08-27 NOTE — Telephone Encounter (Signed)
Patient called to report OV are still not being received at [facility] at the request of the patient Faxed again to [phone] and OV mailed to patient

## 2019-08-28 DIAGNOSIS — J449 Chronic obstructive pulmonary disease, unspecified: Secondary | ICD-10-CM | POA: Diagnosis not present

## 2019-09-01 ENCOUNTER — Telehealth: Payer: Self-pay | Admitting: Family Medicine

## 2019-09-01 NOTE — Telephone Encounter (Signed)
Contacted [facility] to see what kind of form was needed. [facility] states that they are only giving COVID vaccines to healthcare workers and nursing homes. They will be giving the vaccine to the 65+ population at some point but not at this time. [facility] states that check the news periodically with updates. There will be some paper work with each vaccine but they do not have the paper work at this time. Tried to contact patient to inform him, but no answer. Will try again later.

## 2019-09-01 NOTE — Telephone Encounter (Signed)
Pt states he went to [facility] to find out about the covid vaccine and was told his doctor would need to fill out a form for him to get it due to his pre existing conditions. He's wanting to know what he needs to do from here.

## 2019-09-02 ENCOUNTER — Ambulatory Visit: Payer: Medicare Other | Admitting: Pulmonary Disease

## 2019-09-02 NOTE — Telephone Encounter (Signed)
Pt returned call and verbalized understanding  

## 2019-09-07 ENCOUNTER — Encounter (HOSPITAL_COMMUNITY): Payer: Self-pay | Admitting: Cardiology

## 2019-09-07 ENCOUNTER — Ambulatory Visit (HOSPITAL_COMMUNITY)
Admission: RE | Admit: 2019-09-07 | Discharge: 2019-09-07 | Disposition: A | Discharge status: Home / Self Care | Payer: Medicare Other | Source: Ambulatory Visit | Attending: Cardiology | Admitting: Cardiology

## 2019-09-07 ENCOUNTER — Other Ambulatory Visit: Payer: Self-pay

## 2019-09-07 DIAGNOSIS — E7849 Other hyperlipidemia: Secondary | ICD-10-CM | POA: Diagnosis not present

## 2019-09-07 LAB — LIPID PANEL
Cholesterol: 120 mg/dL (ref 0–200)
HDL: 33 mg/dL — ABNORMAL LOW (ref >40)
LDL Cholesterol: 66 mg/dL (ref 0–99)
Total CHOL/HDL Ratio: 3.6 RATIO
Triglycerides: 103 mg/dL (ref <150)
VLDL: 21 mg/dL (ref 0–40)

## 2019-09-07 LAB — HEPATIC FUNCTION PANEL
ALT: 12 U/L (ref 0–44)
AST: 18 U/L (ref 15–41)
Albumin: 3.5 g/dL (ref 3.5–5.0)
Alkaline Phosphatase: 64 U/L (ref 38–126)
Bilirubin, Direct: 0.1 mg/dL (ref 0.0–0.2)
Indirect Bilirubin: 0.4 mg/dL (ref 0.3–0.9)
Total Bilirubin: 0.5 mg/dL (ref 0.3–1.2)
Total Protein: 7 g/dL (ref 6.5–8.1)

## 2019-09-10 ENCOUNTER — Telehealth (HOSPITAL_COMMUNITY): Payer: Self-pay

## 2019-09-10 NOTE — Telephone Encounter (Signed)
Pt left vm requesting lab results from 09/07/19.  Returned call, lab work reviewed with patient. Results were also mailed by [facility].  Pt appreciative.

## 2019-09-18 ENCOUNTER — Telehealth (HOSPITAL_COMMUNITY): Payer: Self-pay

## 2019-09-18 NOTE — Telephone Encounter (Signed)
Received a Request For Medical Records from [place]. Faxed records to [phone].

## 2019-09-23 ENCOUNTER — Ambulatory Visit (INDEPENDENT_AMBULATORY_CARE_PROVIDER_SITE_OTHER): Payer: Medicare Other | Admitting: Pulmonary Disease

## 2019-09-23 ENCOUNTER — Encounter: Payer: Self-pay | Admitting: Pulmonary Disease

## 2019-09-23 ENCOUNTER — Other Ambulatory Visit: Payer: Self-pay

## 2019-09-23 DIAGNOSIS — C3411 Malignant neoplasm of upper lobe, right bronchus or lung: Secondary | ICD-10-CM

## 2019-09-23 DIAGNOSIS — I5042 Chronic combined systolic (congestive) and diastolic (congestive) heart failure: Secondary | ICD-10-CM

## 2019-09-23 DIAGNOSIS — Z85118 Personal history of other malignant neoplasm of bronchus and lung: Secondary | ICD-10-CM

## 2019-09-23 DIAGNOSIS — Z87891 Personal history of nicotine dependence: Secondary | ICD-10-CM | POA: Diagnosis not present

## 2019-09-23 DIAGNOSIS — J449 Chronic obstructive pulmonary disease, unspecified: Secondary | ICD-10-CM | POA: Diagnosis not present

## 2019-09-23 NOTE — Assessment & Plan Note (Signed)
Reviewed last oncology note and CT scan from 05/2019 Appears stable, subcarinal node stable No evidence of recurrence

## 2019-09-23 NOTE — Assessment & Plan Note (Signed)
Recent bronchitic exacerbation, he has completed 7 days of doxycycline Refills on Symbicort. Can decrease duo nebs to once or twice daily-and use as needed  Discussed plan for the future Use antibiotic only if persistent green sputum for 2 to 3 days or wheezing or shortness of breath associated with cough

## 2019-09-23 NOTE — Assessment & Plan Note (Addendum)
Cough may be related to [facility], discuss with cardiology-seems like he does need this medication so we will have to tolerate side effect of cough Reviewed heart failure service note from 06/2019 & labs

## 2019-09-23 NOTE — Progress Notes (Signed)
I connected with  Paul Sandoval. on 09/23/19 by phone and verified that I am speaking with the correct person using two identifiers.   I discussed the limitations of evaluation and management by telemedicine. The patient expressed understanding and agreed to proceed.  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 He has done well, was given doxycycline 11/21 which he did not really take on that occasion but has taken it last few days due to cough with green sputum, denies wheezing or shortness of breath  Reviewed note from heart failure service, attempted to decrease Lasix but he was short of breath and this was started back again.  He has refused ICD in the past. Maintained on Entresto  Complains of throat clearing frequent and occasional cough    Significant tests/ events reviewed  05/2019 CT chest  Stable appearance of mildly enlarged subcarinal lymph node.  Stable loculated effusions post talc pleurodesis and paramediastinal and right upper lobe fibrosis post radiation  12/12/2018-CT chest with contrast-stable appearance of chest with posttreatment related changes including chronic postradiation masslike fibrosis in the lungs,no definite findings to suggest local recurrence of disease or metastatic disease in the thorax, aortic arthrosclerosis  04/09/2018-echocardiogram-LV ejection fraction 25 to 30%, diffuse hypokinesis,  02/01/2016-pulmonary function test-FVC 1.96 (45% predicted), postbronchodilator ratio of 67, postbronchodilator FEV1 1.64 (53% predicted, postbronchodilator  response, significant mid flow reversibility, DLCO 58   Exam-unable since televisit  Total encounter time x 27 mins

## 2019-09-23 NOTE — Patient Instructions (Signed)
Refills on Symbicort. Can decrease duo nebs to once or twice daily-and use as needed  Cough may be related to [facility], discuss with cardiology  Use antibiotic only if persistent green sputum for 2 to 3 days or wheezing or shortness of breath associated with cough

## 2019-09-28 DIAGNOSIS — J449 Chronic obstructive pulmonary disease, unspecified: Secondary | ICD-10-CM | POA: Diagnosis not present

## 2019-10-01 ENCOUNTER — Encounter: Payer: Self-pay | Admitting: Family Medicine

## 2019-10-06 ENCOUNTER — Ambulatory Visit (HOSPITAL_BASED_OUTPATIENT_CLINIC_OR_DEPARTMENT_OTHER)
Admission: RE | Admit: 2019-10-06 | Discharge: 2019-10-06 | Disposition: A | Discharge status: Home / Self Care | Payer: Medicare Other | Source: Ambulatory Visit | Attending: Cardiology | Admitting: Cardiology

## 2019-10-06 ENCOUNTER — Encounter (HOSPITAL_COMMUNITY): Payer: Self-pay | Admitting: Cardiology

## 2019-10-06 ENCOUNTER — Ambulatory Visit (HOSPITAL_COMMUNITY)
Admission: RE | Admit: 2019-10-06 | Discharge: 2019-10-06 | Disposition: A | Discharge status: Home / Self Care | Payer: Medicare Other | Source: Ambulatory Visit | Attending: Family Medicine | Admitting: Family Medicine

## 2019-10-06 ENCOUNTER — Other Ambulatory Visit: Payer: Self-pay

## 2019-10-06 VITALS — BP 108/52 | HR 82 | Wt 183.6 lb

## 2019-10-06 DIAGNOSIS — Z87891 Personal history of nicotine dependence: Secondary | ICD-10-CM | POA: Diagnosis not present

## 2019-10-06 DIAGNOSIS — I2510 Atherosclerotic heart disease of native coronary artery without angina pectoris: Secondary | ICD-10-CM | POA: Diagnosis not present

## 2019-10-06 DIAGNOSIS — I25810 Atherosclerosis of coronary artery bypass graft(s) without angina pectoris: Secondary | ICD-10-CM | POA: Diagnosis not present

## 2019-10-06 DIAGNOSIS — I130 Hypertensive heart and chronic kidney disease with heart failure and stage 1 through stage 4 chronic kidney disease, or unspecified chronic kidney disease: Secondary | ICD-10-CM | POA: Insufficient documentation

## 2019-10-06 DIAGNOSIS — I5042 Chronic combined systolic (congestive) and diastolic (congestive) heart failure: Secondary | ICD-10-CM

## 2019-10-06 DIAGNOSIS — J90 Pleural effusion, not elsewhere classified: Secondary | ICD-10-CM | POA: Insufficient documentation

## 2019-10-06 DIAGNOSIS — I4510 Unspecified right bundle-branch block: Secondary | ICD-10-CM | POA: Insufficient documentation

## 2019-10-06 DIAGNOSIS — Z79899 Other long term (current) drug therapy: Secondary | ICD-10-CM | POA: Insufficient documentation

## 2019-10-06 DIAGNOSIS — Z7984 Long term (current) use of oral hypoglycemic drugs: Secondary | ICD-10-CM | POA: Diagnosis not present

## 2019-10-06 DIAGNOSIS — Z955 Presence of coronary angioplasty implant and graft: Secondary | ICD-10-CM | POA: Insufficient documentation

## 2019-10-06 DIAGNOSIS — C3490 Malignant neoplasm of unspecified part of unspecified bronchus or lung: Secondary | ICD-10-CM | POA: Insufficient documentation

## 2019-10-06 DIAGNOSIS — Z8249 Family history of ischemic heart disease and other diseases of the circulatory system: Secondary | ICD-10-CM | POA: Insufficient documentation

## 2019-10-06 DIAGNOSIS — J449 Chronic obstructive pulmonary disease, unspecified: Secondary | ICD-10-CM | POA: Diagnosis not present

## 2019-10-06 DIAGNOSIS — Z7982 Long term (current) use of aspirin: Secondary | ICD-10-CM | POA: Diagnosis not present

## 2019-10-06 DIAGNOSIS — I255 Ischemic cardiomyopathy: Secondary | ICD-10-CM | POA: Diagnosis not present

## 2019-10-06 DIAGNOSIS — Z7902 Long term (current) use of antithrombotics/antiplatelets: Secondary | ICD-10-CM | POA: Insufficient documentation

## 2019-10-06 DIAGNOSIS — I5022 Chronic systolic (congestive) heart failure: Secondary | ICD-10-CM | POA: Diagnosis not present

## 2019-10-06 DIAGNOSIS — Z801 Family history of malignant neoplasm of trachea, bronchus and lung: Secondary | ICD-10-CM | POA: Insufficient documentation

## 2019-10-06 DIAGNOSIS — N183 Chronic kidney disease, stage 3 unspecified: Secondary | ICD-10-CM | POA: Insufficient documentation

## 2019-10-06 DIAGNOSIS — E785 Hyperlipidemia, unspecified: Secondary | ICD-10-CM | POA: Diagnosis not present

## 2019-10-06 LAB — BASIC METABOLIC PANEL
Anion gap: 9 (ref 5–15)
BUN: 22 mg/dL (ref 8–23)
CO2: 28 mmol/L (ref 22–32)
Calcium: 9 mg/dL (ref 8.9–10.3)
Chloride: 102 mmol/L (ref 98–111)
Creatinine, Ser: 1.78 mg/dL — ABNORMAL HIGH (ref 0.61–1.24)
GFR calc Af Amer: 41 mL/min — ABNORMAL LOW (ref >60)
GFR calc non Af Amer: 35 mL/min — ABNORMAL LOW (ref >60)
Glucose, Bld: 98 mg/dL (ref 70–99)
Potassium: 4 mmol/L (ref 3.5–5.1)
Sodium: 139 mmol/L (ref 135–145)

## 2019-10-06 NOTE — Progress Notes (Signed)
  Echocardiogram 2D Echocardiogram has been performed.  Paul Sandoval 10/06/2019, 10:34 AM

## 2019-10-06 NOTE — Patient Instructions (Signed)
No medication changes today!   You were provided a prescription for support hose. You can take this prescription to [facility] supply on [address].   Labs today We will only contact you if something comes back abnormal or we need to make some changes. Otherwise no news is good news!   Your physician recommends that you schedule a follow-up appointment in: 3 months with Dr Aundra Dubin   Please call office at [phone] option 2 if you have any questions or concerns.    At the [facility], you and your health needs are our priority. As part of our continuing mission to provide you with exceptional heart care, we have created designated Provider Care Teams. These Care Teams include your primary Cardiologist (physician) and Advanced Practice Providers (APPs- Physician Assistants and Nurse Practitioners) who all work together to provide you with the care you need, when you need it.   You may see any of the following providers on your designated Care Team at your next follow up: Marland Kitchen Dr Glori Bickers . Dr Loralie Champagne . Darrick Grinder, NP . Lyda Jester, PA . Audry Riles, PharmD   Please be sure to bring in all your medications bottles to every appointment.

## 2019-10-06 NOTE — Progress Notes (Signed)
PCP: Dr. Wolfgang Phoenix Pulmonary: Dr. Lenna Gilford HF Cardiology: Dr. Aundra Dubin  79 y.o. with history of COPD, CAD s/p CABG in 1996 and PCI to dLM and SVG-D in 7/15, ischemic cardiomyopathy, and non-small cell lung cancer presents for followup of CHF and dyspnea.  He was initially referred to [place] by Dr. Lenna Gilford after fall in EF and worsening dyspnea was noted.   Patient reports that his initial symptoms prior to CABG in 1996 were dyspnea.  He has never had any worrisome chest pain.  He did well initially after CABG and was golfing regularly and walking 2-3 miles/day up until around 3/17.  He had one episode of increased dyspnea in 2015 and had cath with PCI to distal LM and SVG-D.  In 3/17, he was diagnosed with NSCLC and was treated with radiation and chemotherapy.  Since then, he has had progressive exertional dyspnea.  He was noted on echo in 7/18 to have EF down to 20-25%.    RHC/LHC was done in 9/18, showing occluded SVG-small D, patent LIMA-LAD and SVG-PDA, 50% shelf-like stenosis distal left main (no intervention), filling pressure not elevated and cardiac output preserved.   I increased his Entresto to 49/51 bid but BP dropped and he had orthostatic symptoms, so I decreased it back to 24/26 bid.   Echo in 8/19 showed that EF remains 30-35%.  Echo in 2/21 showed EF 35%, mildly decreased RV systolic function.   Patient returns for followup of CAD and CHF.  He has been stable recently.  No progression of lung cancer per his oncologist.  Short of breath with strenuous activities, but no problems with ADLs.  No chest pain.  No orthopnea/PND.  Occasional chronic cough that may be attributable to COPD. BP continues to drop after taking his meds, SBP down to around 100.  He will get lightheaded when standing when BP is running low.   Labs (9/17): LDL 90 Labs (7/18): K 3.5, creatinine 1.28, hgb 10.7 Labs (8/18): LDL 68 Labs (9/18): K 4.1, creatinine 1.2, hgb 11.1, plts 98 K Labs (10/18): K 4.1, creatinine 1.5 Labs  (1/19): K 4.3, creatinine 1.54, LDL 80, HDL 49, LFTs normal, TSH normal Labs (4/19): K 4.3, creatinine 1.52, hgb 11.8 Labs (7/19): K 4.1, creatinine 1.5, LDL 75, LFTs normal, hgb 12.2 Labs (10/19): K 4.3, creatinine 1.53 Labs (12/19): K 4.1, creatinine 1.49 Labs (10/20): K 4.4, creatinine 1.67  Labs (1/21): LDL 66 Labs (12/20): K 4.1, creatinine 1.7  PMH: 1. COPD: PFTs (6/17) with severe obstructive airways disease.  2. AAA: s/p repair with stent graft.  3. Non-small cell lung cancer: Stage IIIA, diagnosed 3/17.  He had radiation as well as chemotherapy with carboplatin and paclitaxel.   4. CKD stage 3 5. PNA in 6/17 6. Recurrent right pleural effusion: VATS with talc pleurodesis on right.  No malignant cells noted.  7. HTN 8. H/o THR 9. CAD: CABG 1996 with SVG-D, SVG-PDA, LIMA-LAD.   - LHC (7/15): Totally occluded RCA and LAD.  Severe distal LM stenosis.  Patent SVG-PDA, patent LIMA-LAD.  Severe disease SVG-D.  Patient had DES to distal left main and staged PCI of SVG-D.   - Cardiolite (7/17): EF 30%, prior infarction with no ischemia.  - LHC (9/18): occluded SVG-small D, patent LIMA-LAD and SVG-PDA, 50% shelf-like stenosis distal left main (no intervention).  10. Chronic systolic CHF: Ischemic cardiomyopathy.   - Echo (6/17): EF 30-35%. - Echo (7/18): EF 20-25%, moderate RV dilation with mildly decreased RV systolic function.  -  RHC (9/18): mean RA 3, PA 28/9 mean 18, mean PCWP 10, CI 4.05.  - Echo (8/19): EF 30-35%, mildly decreased RV systolic function.   - Echo (2/21): EF 35%, mildly decreased RV systolic function 11. Carotid stenosis: Carotid dopplers (7/20) with 40-59% BICA stenosis.   Social History   Socioeconomic History  . Marital status: Married    Spouse name: Not on file  . Number of children: 1  . Years of education: Not on file  . Highest education level: Not on file  Occupational History  . Occupation: Retired    Comment: [employer]   . Smoking status: Former Smoker    Packs/day: 1.50    Years: 30.00    Pack years: 45.00    Types: Cigarettes    Start date: 12/01/1956    Quit date: 01/08/1995    Years since quitting: 24.7  . Smokeless tobacco: Never Used  Substance and Sexual Activity  . Alcohol use: No    Alcohol/week: 0.0 standard drinks    Comment: 03/18/2014 "no alacohol since 1996"  . Drug use: No  . Sexual activity: Never  Other Topics Concern  . Not on file  Social History Narrative  . Not on file   Social Determinants of Health   Financial Resource Strain:   . Difficulty of Paying Living Expenses: Not on file  Food Insecurity:   . Worried About Charity fundraiser in the Last Year: Not on file  . Ran Out of Food in the Last Year: Not on file  Transportation Needs:   . Lack of Transportation (Medical): Not on file  . Lack of Transportation (Non-Medical): Not on file  Physical Activity:   . Days of Exercise per Week: Not on file  . Minutes of Exercise per Session: Not on file  Stress:   . Feeling of Stress : Not on file  Social Connections:   . Frequency of Communication with Friends and Family: Not on file  . Frequency of Social Gatherings with Friends and Family: Not on file  . Attends Religious Services: Not on file  . Active Member of Clubs or Organizations: Not on file  . Attends Archivist Meetings: Not on file  . Marital Status: Not on file  Intimate Partner Violence:   . Fear of Current or Ex-Partner: Not on file  . Emotionally Abused: Not on file  . Physically Abused: Not on file  . Sexually Abused: Not on file   Family History  Problem Relation Age of Onset  . Heart disease Father   . Cancer Father        Lung  . Arthritis Mother   . Parkinsonism Mother   . Arthritis Sister        Brother with rheumatoid arthritis  . Hypertension Brother   . Colon cancer Neg Hx   . Colon polyps Neg Hx    ROS: All systems reviewed and negative except as per HPI.   Current  Outpatient Medications  Medication Sig Dispense Refill  . acetaminophen (TYLENOL) 500 MG tablet Take 500 mg by mouth every 6 (six) hours as needed for mild pain.    Marland Kitchen aspirin EC 81 MG tablet Take 81 mg by mouth daily before breakfast.     . budesonide-formoterol (SYMBICORT) 160-4.5 MCG/ACT inhaler Inhale 2 puffs into the lungs 2 (two) times daily. 3 Inhaler 3  . carvedilol (COREG) 12.5 MG tablet Take 1.5 tablets (18.75 mg total) by mouth 2 (two) times  daily with a meal. 90 tablet 6  . clopidogrel (PLAVIX) 75 MG tablet TAKE (1) TABLET BY MOUTH ONCE DAILY. 30 tablet 3  . dapagliflozin propanediol (FARXIGA) 10 MG TABS tablet Take 10 mg by mouth daily before breakfast. 30 tablet 11  . eplerenone (INSPRA) 50 MG tablet Take 1 tablet (50 mg total) by mouth daily. 30 tablet 11  . furosemide (LASIX) 40 MG tablet Take 1 tablet (40 mg total) by mouth daily. 90 tablet 3  . GuaiFENesin (MUCUS RELIEF ADULT PO) Take 2 capsules by mouth as needed.     Marland Kitchen ipratropium-albuterol (DUONEB) 0.5-2.5 (3) MG/3ML SOLN INHALE 1 VIAL VIA NEBULIZER 4 TIMES DAILY. 360 mL 3  . Multiple Vitamins-Minerals (CENTRUM SILVER ADULT 50+) TABS Take 1 tablet by mouth daily with lunch.     . nitroGLYCERIN (NITROSTAT) 0.4 MG SL tablet PLACE 1 TAB UNDER TONGUE EVERY 5 MIN IF NEEDED FOR CHEST PAIN. MAY USE 3 TIMES.NO RELIEF CALL 911. 25 tablet 1  . omeprazole (PRILOSEC) 20 MG capsule Take 1 capsule (20 mg total) by mouth daily. 90 capsule 3  . PROAIR HFA 108 (90 Base) MCG/ACT inhaler INHALE 2 PUFFS BY MOUTH EVERY 4 TO 6 HOURS AS NEEDED FOR WHEEZING. 8.5 g 0  . rosuvastatin (CRESTOR) 20 MG tablet Take 1 tablet (20 mg total) by mouth daily. 90 tablet 3  . sacubitril-valsartan (ENTRESTO) 49-51 MG TAKE (1) TABLET BY MOUTH TWICE DAILY. 60 tablet 11   No current facility-administered medications for this encounter.   BP (!) 108/52   Pulse 82   Wt 83.3 kg (183 lb 9.6 oz)   SpO2 97%   BMI 26.34 kg/m  General: NAD Neck: No JVD, no thyromegaly  or thyroid nodule.  Lungs: Rhonchi bilaterally.  CV: Nondisplaced PMI.  Heart regular S1S2, no murmur, 1+ R>L peripheral edema.  No carotid bruit.  Normal pedal pulses.  Abdomen: Soft, nontender, no hepatosplenomegaly, no distention.  Skin: Intact without lesions or rashes.  Neurologic: Alert and oriented x 3.  Psych: Normal affect. Extremities: No clubbing or cyanosis.  HEENT: Normal.   Assessment/Plan: 1. Chronic systolic CHF: Ischemic cardiomyopathy.  Echo in 7/18 with EF 20-25%, echo 8/19 with EF 30-35%.  Echo was done and reviewed today, EF 35%  Generally, NYHA class II symptoms.  He is not volume overloaded on exam.  I do not think that he has BP room given orthostatic symptoms to increase his cardiac meds, at next appt will try to increase Coreg to goal 25 mg bid if orthostatic symptoms are resolved.  - Continue Entresto 49/51 bid.  - Continue Lasix 40 mg daily.  Check BMET today.  - Continue Coreg 18.75 mg bid.   - Continue eplerenone 50 mg daily.   - Continue dapagliflozin.  - EF persistently low, qualifies for ICD.  He has a RBBB so not good CRT candidate.  Lung cancer has been quiescent and oncologic prognosis seems reasonably positive, so ICD would be reasonable for him.  He is currently not interested in getting an ICD.   2. CAD: s/p CABG then PCI 7/15 to distal LM and SVG-D.  Anginal equivalent in the past appears to have been dyspnea, he has never had significant chest pain.  LHC (9/18) showed patent LIMA-LAD and SVG-PDA; occluded SVG-D; 50% shelf-like LM stenosis - Continue ASA 81, Plavix, and Crestor.   3. COPD: Severe COPD by PFTs in 6/17.  Based on the last RHC/LHC, I suspect that COPD plays a major role in his  mild ongoing dyspnea.   4. Hyperlipidemia: Good LDL in 1/21, 66.  5. NSCLC: Follows with Dr. Earlie Server.  He has finished chemo/radiation. Cancer appeared quiescent on most recent scans with Dr. Earlie Server.   6. Right pleural effusion: This has been chronic. S/p VATS.   7. Carotid stenosis: Repeat carotid dopplers in 7/21.   Loralie Champagne 10/06/2019

## 2019-10-19 ENCOUNTER — Other Ambulatory Visit (HOSPITAL_COMMUNITY): Payer: Self-pay | Admitting: Adult Health

## 2019-10-19 ENCOUNTER — Other Ambulatory Visit: Payer: Self-pay | Admitting: Family Medicine

## 2019-10-19 DIAGNOSIS — C3411 Malignant neoplasm of upper lobe, right bronchus or lung: Secondary | ICD-10-CM

## 2019-11-26 DIAGNOSIS — R609 Edema, unspecified: Secondary | ICD-10-CM | POA: Diagnosis not present

## 2019-11-26 DIAGNOSIS — J449 Chronic obstructive pulmonary disease, unspecified: Secondary | ICD-10-CM | POA: Diagnosis not present

## 2019-12-14 ENCOUNTER — Other Ambulatory Visit: Payer: Self-pay

## 2019-12-14 ENCOUNTER — Inpatient Hospital Stay: Payer: Medicare Other | Attending: Internal Medicine

## 2019-12-14 ENCOUNTER — Other Ambulatory Visit (HOSPITAL_COMMUNITY): Payer: Self-pay | Admitting: Adult Health

## 2019-12-14 ENCOUNTER — Ambulatory Visit (HOSPITAL_COMMUNITY)
Admission: RE | Admit: 2019-12-14 | Discharge: 2019-12-14 | Disposition: A | Discharge status: Home / Self Care | Payer: Medicare Other | Source: Ambulatory Visit | Attending: Internal Medicine | Admitting: Internal Medicine

## 2019-12-14 ENCOUNTER — Other Ambulatory Visit: Payer: Medicare Other

## 2019-12-14 DIAGNOSIS — C3490 Malignant neoplasm of unspecified part of unspecified bronchus or lung: Secondary | ICD-10-CM

## 2019-12-14 DIAGNOSIS — C3411 Malignant neoplasm of upper lobe, right bronchus or lung: Secondary | ICD-10-CM | POA: Diagnosis not present

## 2019-12-14 DIAGNOSIS — Z8719 Personal history of other diseases of the digestive system: Secondary | ICD-10-CM | POA: Diagnosis not present

## 2019-12-14 DIAGNOSIS — Z79899 Other long term (current) drug therapy: Secondary | ICD-10-CM | POA: Diagnosis not present

## 2019-12-14 DIAGNOSIS — I2510 Atherosclerotic heart disease of native coronary artery without angina pectoris: Secondary | ICD-10-CM | POA: Diagnosis not present

## 2019-12-14 DIAGNOSIS — I252 Old myocardial infarction: Secondary | ICD-10-CM | POA: Diagnosis not present

## 2019-12-14 DIAGNOSIS — Z7951 Long term (current) use of inhaled steroids: Secondary | ICD-10-CM | POA: Diagnosis not present

## 2019-12-14 DIAGNOSIS — R0609 Other forms of dyspnea: Secondary | ICD-10-CM | POA: Diagnosis not present

## 2019-12-14 DIAGNOSIS — I10 Essential (primary) hypertension: Secondary | ICD-10-CM | POA: Insufficient documentation

## 2019-12-14 DIAGNOSIS — J90 Pleural effusion, not elsewhere classified: Secondary | ICD-10-CM | POA: Diagnosis not present

## 2019-12-14 DIAGNOSIS — J449 Chronic obstructive pulmonary disease, unspecified: Secondary | ICD-10-CM | POA: Insufficient documentation

## 2019-12-14 DIAGNOSIS — I700 Atherosclerosis of aorta: Secondary | ICD-10-CM | POA: Diagnosis not present

## 2019-12-14 LAB — CMP (CANCER CENTER ONLY)
ALT: 11 U/L (ref 0–44)
AST: 16 U/L (ref 15–41)
Albumin: 3.4 g/dL — ABNORMAL LOW (ref 3.5–5.0)
Alkaline Phosphatase: 75 U/L (ref 38–126)
Anion gap: 8 (ref 5–15)
BUN: 19 mg/dL (ref 8–23)
CO2: 28 mmol/L (ref 22–32)
Calcium: 8.7 mg/dL — ABNORMAL LOW (ref 8.9–10.3)
Chloride: 105 mmol/L (ref 98–111)
Creatinine: 1.71 mg/dL — ABNORMAL HIGH (ref 0.61–1.24)
GFR, Est AFR Am: 43 mL/min — ABNORMAL LOW (ref >60)
GFR, Estimated: 37 mL/min — ABNORMAL LOW (ref >60)
Glucose, Bld: 94 mg/dL (ref 70–99)
Potassium: 4.1 mmol/L (ref 3.5–5.1)
Sodium: 141 mmol/L (ref 135–145)
Total Bilirubin: 0.5 mg/dL (ref 0.3–1.2)
Total Protein: 7.1 g/dL (ref 6.5–8.1)

## 2019-12-14 LAB — CBC WITH DIFFERENTIAL (CANCER CENTER ONLY)
Abs Immature Granulocytes: 0.03 10*3/uL (ref 0.00–0.07)
Basophils Absolute: 0 10*3/uL (ref 0.0–0.1)
Basophils Relative: 0 %
Eosinophils Absolute: 0.1 10*3/uL (ref 0.0–0.5)
Eosinophils Relative: 1 %
HCT: 36.4 % — ABNORMAL LOW (ref 39.0–52.0)
Hemoglobin: 11.2 g/dL — ABNORMAL LOW (ref 13.0–17.0)
Immature Granulocytes: 0 %
Lymphocytes Relative: 11 %
Lymphs Abs: 0.9 10*3/uL (ref 0.7–4.0)
MCH: 27.4 pg (ref 26.0–34.0)
MCHC: 30.8 g/dL (ref 30.0–36.0)
MCV: 89 fL (ref 80.0–100.0)
Monocytes Absolute: 0.7 10*3/uL (ref 0.1–1.0)
Monocytes Relative: 9 %
Neutro Abs: 6.3 10*3/uL (ref 1.7–7.7)
Neutrophils Relative %: 79 %
Platelet Count: 123 10*3/uL — ABNORMAL LOW (ref 150–400)
RBC: 4.09 MIL/uL — ABNORMAL LOW (ref 4.22–5.81)
RDW: 15.7 % — ABNORMAL HIGH (ref 11.5–15.5)
WBC Count: 8.1 10*3/uL (ref 4.0–10.5)
nRBC: 0 % (ref 0.0–0.2)

## 2019-12-16 ENCOUNTER — Encounter: Payer: Self-pay | Admitting: Internal Medicine

## 2019-12-16 ENCOUNTER — Inpatient Hospital Stay (HOSPITAL_BASED_OUTPATIENT_CLINIC_OR_DEPARTMENT_OTHER): Payer: Medicare Other | Admitting: Internal Medicine

## 2019-12-16 ENCOUNTER — Other Ambulatory Visit: Payer: Self-pay

## 2019-12-16 ENCOUNTER — Telehealth: Payer: Self-pay | Admitting: Internal Medicine

## 2019-12-16 VITALS — BP 113/58 | HR 73 | Temp 98.0°F | Resp 18 | Ht 70.0 in | Wt 183.3 lb

## 2019-12-16 DIAGNOSIS — C3411 Malignant neoplasm of upper lobe, right bronchus or lung: Secondary | ICD-10-CM | POA: Diagnosis not present

## 2019-12-16 DIAGNOSIS — J449 Chronic obstructive pulmonary disease, unspecified: Secondary | ICD-10-CM | POA: Diagnosis not present

## 2019-12-16 DIAGNOSIS — D696 Thrombocytopenia, unspecified: Secondary | ICD-10-CM | POA: Diagnosis not present

## 2019-12-16 DIAGNOSIS — J90 Pleural effusion, not elsewhere classified: Secondary | ICD-10-CM | POA: Diagnosis not present

## 2019-12-16 DIAGNOSIS — Z7951 Long term (current) use of inhaled steroids: Secondary | ICD-10-CM | POA: Diagnosis not present

## 2019-12-16 DIAGNOSIS — R0609 Other forms of dyspnea: Secondary | ICD-10-CM | POA: Diagnosis not present

## 2019-12-16 DIAGNOSIS — C3490 Malignant neoplasm of unspecified part of unspecified bronchus or lung: Secondary | ICD-10-CM | POA: Diagnosis not present

## 2019-12-16 DIAGNOSIS — I10 Essential (primary) hypertension: Secondary | ICD-10-CM

## 2019-12-16 DIAGNOSIS — Z8719 Personal history of other diseases of the digestive system: Secondary | ICD-10-CM | POA: Diagnosis not present

## 2019-12-16 DIAGNOSIS — I700 Atherosclerosis of aorta: Secondary | ICD-10-CM | POA: Diagnosis not present

## 2019-12-16 DIAGNOSIS — Z79899 Other long term (current) drug therapy: Secondary | ICD-10-CM | POA: Diagnosis not present

## 2019-12-16 DIAGNOSIS — I2510 Atherosclerotic heart disease of native coronary artery without angina pectoris: Secondary | ICD-10-CM | POA: Diagnosis not present

## 2019-12-16 DIAGNOSIS — I252 Old myocardial infarction: Secondary | ICD-10-CM | POA: Diagnosis not present

## 2019-12-16 NOTE — Progress Notes (Signed)
[place] [phone]   [phone]  OFFICE PROGRESS NOTE  [name], MD [address] [place] [address]  DIAGNOSIS: Stage IIIA (T2b, N2, M0) non-small cell lung cancer, favoring squamous cell carcinoma presented with right upper lobe lung mass in addition to mediastinal lymphadenopathy diagnosed in March 2017.  PRIOR THERAPY:  A course of concurrent chemoradiation with weekly carboplatin for AUC of 2 and paclitaxel 45 MG/M2. Status post 5 cycle with partial response.  CURRENT THERAPY: Observation  INTERVAL HISTORY: Paul Sandoval. 79 y.o. male returns to the clinic today for 6 months follow-up visit.  The patient is feeling fine today with no concerning complaints except for the shortness of breath with exertion and mild cough productive of clear sputum.  He denied having any recent weight loss or night sweats.  He has no nausea, vomiting, diarrhea or constipation.  He has no headache or visual changes.  He had repeat CT scan of the chest performed recently and is here for evaluation and discussion of his discuss results.  MEDICAL HISTORY: Past Medical History:  Diagnosis Date  . AAA (abdominal aortic aneurysm) ([place]) 2010   4.4 cm 08/2008;4.44 in 7/10 and 4.65 in 08/2009; 4.8 by CT in 11/2009; 4.3 by ultrasound in 08/2010  . Anemia   . Arteriosclerotic cardiovascular disease (ASCVD) 1996   CABG-1996  . Arthritis    "fingers" (03/18/2014)  . CAD (coronary artery disease)    03/18/14:  PCI with DES to distal left main. 7/29: DES to the SVG to Diag  . Cancer ([place])    Upper right lobe lung cancer  . Cardiomyopathy, ischemic    Echo 03/17/14: EF 45-50%  . Chronic bronchitis ([place])   . Chronic kidney disease    CRF  . Chronic rhinitis   . Colonic polyp 2002   polypectomy in 2002  . COPD (chronic obstructive pulmonary disease) ([place])   . Diverticulosis   . Dyspnea    with exertion  . ED (erectile dysfunction)   .  Encounter for antineoplastic chemotherapy 12/19/2015  . GERD (gastroesophageal reflux disease)   . History of blood transfusion   . Hyperlipidemia   . Hypertension   . IFG (impaired fasting glucose)   . Myocardial infarction [facility])    "told h/o silent MI sometime before 1996"  . Pneumonia ~ 2001; ~ 2005    has had more than twice  . Right bundle branch block   . Tobacco abuse, in remission    40 pack year total consumption; discontinued in 1996    ALLERGIES:  is allergic to neomycin.  MEDICATIONS:  Current Outpatient Medications  Medication Sig Dispense Refill  . omeprazole (PRILOSEC) 20 MG capsule TAKE ONE CAPSULE BY MOUTH DAILY. 90 capsule 3  . acetaminophen (TYLENOL) 500 MG tablet Take 500 mg by mouth every 6 (six) hours as needed for mild pain.    Marland Kitchen aspirin EC 81 MG tablet Take 81 mg by mouth daily before breakfast.     . budesonide-formoterol (SYMBICORT) 160-4.5 MCG/ACT inhaler Inhale 2 puffs into the lungs 2 (two) times daily. 3 Inhaler 3  . carvedilol (COREG) 12.5 MG tablet Take 1.5 tablets (18.75 mg total) by mouth 2 (two) times daily with a meal. 270 tablet 3  . clopidogrel (PLAVIX) 75 MG tablet TAKE (1) TABLET BY MOUTH ONCE DAILY. 90 tablet 3  . dapagliflozin propanediol (FARXIGA) 10 MG TABS tablet Take 10 mg by mouth daily  before breakfast. 30 tablet 11  . eplerenone (INSPRA) 50 MG tablet Take 1 tablet (50 mg total) by mouth daily. 30 tablet 11  . furosemide (LASIX) 40 MG tablet Take 1 tablet (40 mg total) by mouth daily. 90 tablet 3  . GuaiFENesin (MUCUS RELIEF ADULT PO) Take 2 capsules by mouth as needed.     Marland Kitchen ipratropium-albuterol (DUONEB) 0.5-2.5 (3) MG/3ML SOLN INHALE 1 VIAL VIA NEBULIZER 4 TIMES DAILY. 360 mL 3  . Multiple Vitamins-Minerals (CENTRUM SILVER ADULT 50+) TABS Take 1 tablet by mouth daily with lunch.     . nitroGLYCERIN (NITROSTAT) 0.4 MG SL tablet PLACE 1 TAB UNDER TONGUE EVERY 5 MIN IF NEEDED FOR CHEST PAIN. MAY USE 3 TIMES.NO RELIEF CALL 911. 25 tablet 1   . PROAIR HFA 108 (90 Base) MCG/ACT inhaler INHALE 2 PUFFS BY MOUTH EVERY 4 TO 6 HOURS AS NEEDED FOR WHEEZING. 8.5 g 0  . rosuvastatin (CRESTOR) 20 MG tablet Take 1 tablet (20 mg total) by mouth daily. 90 tablet 3  . sacubitril-valsartan (ENTRESTO) 49-51 MG TAKE (1) TABLET BY MOUTH TWICE DAILY. 60 tablet 11   No current facility-administered medications for this visit.    SURGICAL HISTORY:  Past Surgical History:  Procedure Laterality Date  . ABDOMINAL AORTIC ANEURYSM REPAIR  11/2012  . ABDOMINAL AORTIC ENDOVASCULAR STENT GRAFT N/A 12/11/2012   Procedure: ABDOMINAL AORTIC ENDOVASCULAR STENT GRAFT;  Surgeon: Mal Misty, MD;  Location: [facility];  Service: Vascular;  Laterality: N/A;  Ultrasound guided; [place]  . CARDIAC CATHETERIZATION  01/08/1995  . COLONOSCOPY  2002   polypectomy-patient denies  . CORONARY ANGIOPLASTY WITH STENT PLACEMENT  03/18/2014   "1"  . CORONARY ANGIOPLASTY WITH STENT PLACEMENT  03/24/2014   "1"  . CORONARY ARTERY BYPASS GRAFT  01/09/1995   "CABG X3"  . FLEXIBLE BRONCHOSCOPY N/A 03/01/2017   Procedure: FLEXIBLE BRONCHOSCOPY WITH BIOPSIES;  Surgeon: Gaye Pollack, MD;  Location: [facility] OR;  Service: Thoracic;  Laterality: N/A;  . JOINT REPLACEMENT    . LAPAROSCOPIC CHOLECYSTECTOMY  12/2009  . LEFT AND RIGHT HEART CATHETERIZATION WITH CORONARY/GRAFT ANGIOGRAM N/A 03/18/2014   Procedure: LEFT AND RIGHT HEART CATHETERIZATION WITH Beatrix Fetters;  Surgeon: Blane Ohara, MD;  Location: [facility] CATH LAB;  Service: Cardiovascular;  Laterality: N/A;  . PERCUTANEOUS CORONARY STENT INTERVENTION (PCI-S)  03/18/2014   Procedure: PERCUTANEOUS CORONARY STENT INTERVENTION (PCI-S);  Surgeon: Blane Ohara, MD;  Location: [facility] CATH LAB;  Service: Cardiovascular;;  . PERCUTANEOUS CORONARY STENT INTERVENTION (PCI-S) N/A 03/24/2014   Procedure: PERCUTANEOUS CORONARY STENT INTERVENTION (PCI-S);  Surgeon: Blane Ohara, MD;  Location: [facility] CATH LAB;  Service: Cardiovascular;  Laterality:  N/A;  . PLEURAL EFFUSION DRAINAGE Right 03/01/2017   Procedure: DRAINAGE OF PLEURAL EFFUSION;  Surgeon: Gaye Pollack, MD;  Location: [facility] [place];  Service: Thoracic;  Laterality: Right;  . RIGHT/LEFT HEART CATH AND CORONARY/GRAFT ANGIOGRAPHY N/A 05/15/2017   Procedure: RIGHT/LEFT HEART CATH AND CORONARY/GRAFT ANGIOGRAPHY;  Surgeon: Larey Dresser, MD;  Location: [place] CV LAB;  Service: Cardiovascular;  Laterality: N/A;  . TALC PLEURODESIS Right 03/01/2017   Procedure: Pietro Cassis;  Surgeon: Gaye Pollack, MD;  Location: [place];  Service: Thoracic;  Laterality: Right;  . TOTAL HIP ARTHROPLASTY Left 01/21/2013   Procedure: TOTAL HIP ARTHROPLASTY ANTERIOR APPROACH;  Surgeon: Mauri Pole, MD;  Location: [place];  Service: Orthopedics;  Laterality: Left;  Marland Kitchen VIDEO ASSISTED THORACOSCOPY Right 03/01/2017   Procedure: VIDEO ASSISTED THORACOSCOPY WITH BIOPSIES;  Surgeon: Cyndia Bent,  Fernande Boyden, MD;  Location: [place];  Service: Thoracic;  Laterality: Right;  Marland Kitchen VIDEO BRONCHOSCOPY N/A 11/17/2015   Procedure: VIDEO BRONCHOSCOPY WITH FLUORO;  Surgeon: Rigoberto Noel, MD;  Location: [place];  Service: Cardiopulmonary;  Laterality: N/A;    REVIEW OF SYSTEMS:  A comprehensive review of systems was negative except for: Respiratory: positive for cough and dyspnea on exertion   PHYSICAL EXAMINATION: General appearance: alert, cooperative and no distress Head: Normocephalic, without obvious abnormality, atraumatic Neck: no adenopathy, no JVD, supple, symmetrical, trachea midline and thyroid not enlarged, symmetric, no tenderness/mass/nodules Lymph nodes: Cervical, supraclavicular, and axillary nodes normal. Resp: clear to auscultation bilaterally Back: symmetric, no curvature. ROM normal. No CVA tenderness. Cardio: regular rate and rhythm, S1, S2 normal, no murmur, click, rub or gallop GI: soft, non-tender; bowel sounds normal; no masses,  no organomegaly Extremities: extremities normal, atraumatic, no cyanosis or  edema  ECOG PERFORMANCE STATUS: 1 - Symptomatic but completely ambulatory  Blood pressure (!) 113/58, pulse 73, temperature 98 F (36.7 C), temperature source Temporal, resp. rate 18, height _0  (1.778 m), weight 183 lb 4.8 oz (83.1 kg), SpO2 95 %.  LABORATORY DATA: Lab Results  Component Value Date   WBC 8.1 12/14/2019   HGB 11.2 (L) 12/14/2019   HCT 36.4 (L) 12/14/2019   MCV 89.0 12/14/2019   PLT 123 (L) 12/14/2019      Chemistry      Component Value Date/Time   NA 141 12/14/2019 0904   NA 137 05/21/2017 0906   K 4.1 12/14/2019 0904   K 4.1 05/21/2017 0906   CL 105 12/14/2019 0904   CO2 28 12/14/2019 0904   CO2 29 05/21/2017 0906   BUN 19 12/14/2019 0904   BUN 14.6 05/21/2017 0906   CREATININE 1.71 (H) 12/14/2019 0904   CREATININE 1.2 05/21/2017 0906      Component Value Date/Time   CALCIUM 8.7 (L) 12/14/2019 0904   CALCIUM 9.4 05/21/2017 0906   ALKPHOS 75 12/14/2019 0904   ALKPHOS 86 05/21/2017 0906   AST 16 12/14/2019 0904   AST 19 05/21/2017 0906   ALT 11 12/14/2019 0904   ALT 14 05/21/2017 0906   BILITOT 0.5 12/14/2019 0904   BILITOT 0.57 05/21/2017 0906       RADIOGRAPHIC STUDIES: CT Chest Wo Contrast  Result Date: 12/14/2019 CLINICAL DATA:  Non-small cell lung cancer. Restaging. EXAM: CT CHEST WITHOUT CONTRAST TECHNIQUE: Multidetector CT imaging of the chest was performed following the standard protocol without IV contrast. COMPARISON:  06/11/2019 FINDINGS: Cardiovascular: The heart size appears normal. Previous median sternotomy and CABG procedure. No pericardial effusion. Aortic atherosclerosis Mediastinum/Nodes: Normal appearance of the thyroid gland. The trachea appears patent and is midline. The no mediastinal adenopathy. Unchanged appearance of 1 cm subcarinal lymph node, image 61/2. Lungs/Pleura: Chronic loculated right pleural effusion is again noted. This has 2 components 1 extending over the right lung apex and the second component overlying the  posterior and lateral right lung base. Masslike architectural distortion, volume loss and fibrosis is again seen within the right upper lobe and appears unchanged. No suspicious pulmonary nodule or mass identified at this time. Upper Abdomen: No acute abnormality. Previous cholecystectomy. Aortic stent graft is partially visualized. Musculoskeletal: Spondylosis identified within the thoracic spine. There is a compression deformity involving T4 vertebra with loss of approximately 30% of the anterior vertebral body height. This is unchanged from 06/11/2019. IMPRESSION: 1. Stable CT of the chest. No findings of recurrent tumor or metastatic disease. 2.  Aortic atherosclerosis. Aortic Atherosclerosis (ICD10-I70.0). Electronically Signed   By: Kerby Moors M.D.   On: 12/14/2019 12:47    ASSESSMENT AND PLAN:  This is a very pleasant 79 years old white male with a stage IIIa non-small cell lung cancer status post a course of concurrent chemoradiation with weekly carboplatin and paclitaxel and he had a rough time with the treatment at that time. He did not receive consolidation chemotherapy. The patient is currently on observation and he is feeling fine with no concerning complaints. He had repeat CT scan of the chest performed recently.  I personally and independently reviewed the scans and discussed the results with the patient today. His scan showed no concerning findings for disease progression. I recommended for the patient to continue on observation with repeat CT scan of the chest in 6 months. For the renal insufficiency, he will follow-up with his primary care physician and if needed he would be referred to nephrology for evaluation. The patient was advised to call immediately if he has any concerning symptoms in the interval. The patient voices understanding of current disease status and treatment options and is in agreement with the current care plan. All questions were answered. The patient knows to  call the clinic with any problems, questions or concerns. We can certainly see the patient much sooner if necessary.   Disclaimer: This note was dictated with voice recognition software. Similar sounding words can inadvertently be transcribed and may not be corrected upon review.

## 2019-12-16 NOTE — Telephone Encounter (Signed)
Scheduled per los. Gave avs and calendar  

## 2019-12-26 DIAGNOSIS — J449 Chronic obstructive pulmonary disease, unspecified: Secondary | ICD-10-CM | POA: Diagnosis not present

## 2019-12-26 DIAGNOSIS — R609 Edema, unspecified: Secondary | ICD-10-CM | POA: Diagnosis not present

## 2020-01-07 ENCOUNTER — Ambulatory Visit (HOSPITAL_COMMUNITY)
Admission: RE | Admit: 2020-01-07 | Discharge: 2020-01-07 | Disposition: A | Discharge status: Home / Self Care | Payer: Medicare Other | Source: Ambulatory Visit | Attending: Cardiology | Admitting: Cardiology

## 2020-01-07 ENCOUNTER — Other Ambulatory Visit: Payer: Self-pay

## 2020-01-07 ENCOUNTER — Encounter (HOSPITAL_COMMUNITY): Payer: Self-pay | Admitting: Cardiology

## 2020-01-07 VITALS — BP 100/54 | HR 79 | Wt 181.8 lb

## 2020-01-07 DIAGNOSIS — J90 Pleural effusion, not elsewhere classified: Secondary | ICD-10-CM | POA: Diagnosis not present

## 2020-01-07 DIAGNOSIS — Z79899 Other long term (current) drug therapy: Secondary | ICD-10-CM | POA: Diagnosis not present

## 2020-01-07 DIAGNOSIS — Z87891 Personal history of nicotine dependence: Secondary | ICD-10-CM | POA: Insufficient documentation

## 2020-01-07 DIAGNOSIS — I255 Ischemic cardiomyopathy: Secondary | ICD-10-CM | POA: Diagnosis not present

## 2020-01-07 DIAGNOSIS — I5042 Chronic combined systolic (congestive) and diastolic (congestive) heart failure: Secondary | ICD-10-CM | POA: Diagnosis not present

## 2020-01-07 DIAGNOSIS — Z7951 Long term (current) use of inhaled steroids: Secondary | ICD-10-CM | POA: Diagnosis not present

## 2020-01-07 DIAGNOSIS — E7849 Other hyperlipidemia: Secondary | ICD-10-CM

## 2020-01-07 DIAGNOSIS — I6529 Occlusion and stenosis of unspecified carotid artery: Secondary | ICD-10-CM | POA: Diagnosis not present

## 2020-01-07 DIAGNOSIS — Z7984 Long term (current) use of oral hypoglycemic drugs: Secondary | ICD-10-CM | POA: Insufficient documentation

## 2020-01-07 DIAGNOSIS — J449 Chronic obstructive pulmonary disease, unspecified: Secondary | ICD-10-CM | POA: Insufficient documentation

## 2020-01-07 DIAGNOSIS — Z951 Presence of aortocoronary bypass graft: Secondary | ICD-10-CM | POA: Insufficient documentation

## 2020-01-07 DIAGNOSIS — N183 Chronic kidney disease, stage 3 unspecified: Secondary | ICD-10-CM | POA: Insufficient documentation

## 2020-01-07 DIAGNOSIS — E785 Hyperlipidemia, unspecified: Secondary | ICD-10-CM | POA: Insufficient documentation

## 2020-01-07 DIAGNOSIS — Z7902 Long term (current) use of antithrombotics/antiplatelets: Secondary | ICD-10-CM | POA: Insufficient documentation

## 2020-01-07 DIAGNOSIS — I25810 Atherosclerosis of coronary artery bypass graft(s) without angina pectoris: Secondary | ICD-10-CM | POA: Diagnosis not present

## 2020-01-07 DIAGNOSIS — Z7982 Long term (current) use of aspirin: Secondary | ICD-10-CM | POA: Diagnosis not present

## 2020-01-07 DIAGNOSIS — Z8249 Family history of ischemic heart disease and other diseases of the circulatory system: Secondary | ICD-10-CM | POA: Insufficient documentation

## 2020-01-07 DIAGNOSIS — C3490 Malignant neoplasm of unspecified part of unspecified bronchus or lung: Secondary | ICD-10-CM | POA: Diagnosis not present

## 2020-01-07 DIAGNOSIS — I4510 Unspecified right bundle-branch block: Secondary | ICD-10-CM | POA: Insufficient documentation

## 2020-01-07 DIAGNOSIS — I130 Hypertensive heart and chronic kidney disease with heart failure and stage 1 through stage 4 chronic kidney disease, or unspecified chronic kidney disease: Secondary | ICD-10-CM | POA: Insufficient documentation

## 2020-01-07 DIAGNOSIS — I2510 Atherosclerotic heart disease of native coronary artery without angina pectoris: Secondary | ICD-10-CM

## 2020-01-07 DIAGNOSIS — I5022 Chronic systolic (congestive) heart failure: Secondary | ICD-10-CM | POA: Diagnosis not present

## 2020-01-07 LAB — BASIC METABOLIC PANEL
Anion gap: 8 (ref 5–15)
BUN: 22 mg/dL (ref 8–23)
CO2: 29 mmol/L (ref 22–32)
Calcium: 9.1 mg/dL (ref 8.9–10.3)
Chloride: 102 mmol/L (ref 98–111)
Creatinine, Ser: 1.81 mg/dL — ABNORMAL HIGH (ref 0.61–1.24)
GFR calc Af Amer: 40 mL/min — ABNORMAL LOW (ref >60)
GFR calc non Af Amer: 35 mL/min — ABNORMAL LOW (ref >60)
Glucose, Bld: 99 mg/dL (ref 70–99)
Potassium: 4.5 mmol/L (ref 3.5–5.1)
Sodium: 139 mmol/L (ref 135–145)

## 2020-01-07 LAB — FERRITIN: Ferritin: 256 ng/mL (ref 24–336)

## 2020-01-07 LAB — IRON AND TIBC
Iron: 51 ug/dL (ref 45–182)
Saturation Ratios: 18 % (ref 17.9–39.5)
TIBC: 287 ug/dL (ref 250–450)
UIBC: 236 ug/dL

## 2020-01-07 NOTE — Progress Notes (Signed)
PCP: Dr. Wolfgang Phoenix Pulmonary: Dr. Lenna Gilford HF Cardiology: Dr. Aundra Dubin  79 y.o. with history of COPD, CAD s/p CABG in 1996 and PCI to dLM and SVG-D in 7/15, ischemic cardiomyopathy, and non-small cell lung cancer presents for followup of CHF and dyspnea.  He was initially referred to [place] by Dr. Lenna Gilford after fall in EF and worsening dyspnea was noted.   Patient reports that his initial symptoms prior to CABG in 1996 were dyspnea.  He has never had any worrisome chest pain.  He did well initially after CABG and was golfing regularly and walking 2-3 miles/day up until around 3/17.  He had one episode of increased dyspnea in 2015 and had cath with PCI to distal LM and SVG-D.  In 3/17, he was diagnosed with NSCLC and was treated with radiation and chemotherapy.  Since then, he has had progressive exertional dyspnea.  He was noted on echo in 7/18 to have EF down to 20-25%.    RHC/LHC was done in 9/18, showing occluded SVG-small D, patent LIMA-LAD and SVG-PDA, 50% shelf-like stenosis distal left main (no intervention), filling pressure not elevated and cardiac output preserved.   Echo in 8/19 showed that EF remains 30-35%.  Echo in 2/21 showed EF 35%, mildly decreased RV systolic function.   Patient returns for followup of CAD and CHF.  He has been stable recently.  Weight is down 2 lbs.  He has mild dyspnea chronically.  No problems walking around grocery store.  He gets short of breath after about 15 minutes of weed-eating.  He is able to do some gardening, will rest periodically due to dyspnea.  No chest pain.  No lightheadedness though BP is on the lower side. He wears compression stockings.    Labs (9/17): LDL 90 Labs (7/18): K 3.5, creatinine 1.28, hgb 10.7 Labs (8/18): LDL 68 Labs (9/18): K 4.1, creatinine 1.2, hgb 11.1, plts 98 K Labs (10/18): K 4.1, creatinine 1.5 Labs (1/19): K 4.3, creatinine 1.54, LDL 80, HDL 49, LFTs normal, TSH normal Labs (4/19): K 4.3, creatinine 1.52, hgb 11.8 Labs (7/19): K  4.1, creatinine 1.5, LDL 75, LFTs normal, hgb 12.2 Labs (10/19): K 4.3, creatinine 1.53 Labs (12/19): K 4.1, creatinine 1.49 Labs (10/20): K 4.4, creatinine 1.67  Labs (1/21): LDL 66 Labs (12/20): K 4.1, creatinine 1.7 Labs (4/21): K 4.1, creatinine 1.71  ECG (personally reviewed): NSR, PACs, RBBB-like IVCD  PMH: 1. COPD: PFTs (6/17) with severe obstructive airways disease.  2. AAA: s/p repair with stent graft.  3. Non-small cell lung cancer: Stage IIIA, diagnosed 3/17.  He had radiation as well as chemotherapy with carboplatin and paclitaxel.   4. CKD stage 3 5. PNA in 6/17 6. Recurrent right pleural effusion: VATS with talc pleurodesis on right.  No malignant cells noted.  7. HTN 8. H/o THR 9. CAD: CABG 1996 with SVG-D, SVG-PDA, LIMA-LAD.   - LHC (7/15): Totally occluded RCA and LAD.  Severe distal LM stenosis.  Patent SVG-PDA, patent LIMA-LAD.  Severe disease SVG-D.  Patient had DES to distal left main and staged PCI of SVG-D.   - Cardiolite (7/17): EF 30%, prior infarction with no ischemia.  - LHC (9/18): occluded SVG-small D, patent LIMA-LAD and SVG-PDA, 50% shelf-like stenosis distal left main (no intervention).  10. Chronic systolic CHF: Ischemic cardiomyopathy.   - Echo (6/17): EF 30-35%. - Echo (7/18): EF 20-25%, moderate RV dilation with mildly decreased RV systolic function.  - RHC (9/18): mean RA 3, PA 28/9 mean 18, mean  PCWP 10, CI 4.05.  - Echo (8/19): EF 30-35%, mildly decreased RV systolic function.   - Echo (2/21): EF 35%, mildly decreased RV systolic function 11. Carotid stenosis: Carotid dopplers (7/20) with 40-59% BICA stenosis.   Social History   Socioeconomic History  . Marital status: Married    Spouse name: Not on file  . Number of children: 1  . Years of education: Not on file  . Highest education level: Not on file  Occupational History  . Occupation: Retired    Comment: [employer]  . Smoking status: Former Smoker     Packs/day: 1.50    Years: 30.00    Pack years: 45.00    Types: Cigarettes    Start date: 12/01/1956    Quit date: 01/08/1995    Years since quitting: 25.0  . Smokeless tobacco: Never Used  Substance and Sexual Activity  . Alcohol use: No    Alcohol/week: 0.0 standard drinks    Comment: 03/18/2014 "no alacohol since 1996"  . Drug use: No  . Sexual activity: Never  Other Topics Concern  . Not on file  Social History Narrative  . Not on file   Social Determinants of Health   Financial Resource Strain:   . Difficulty of Paying Living Expenses:   Food Insecurity:   . Worried About Charity fundraiser in the Last Year:   . Arboriculturist in the Last Year:   Transportation Needs:   . Film/video editor (Medical):   Marland Kitchen Lack of Transportation (Non-Medical):   Physical Activity:   . Days of Exercise per Week:   . Minutes of Exercise per Session:   Stress:   . Feeling of Stress :   Social Connections:   . Frequency of Communication with Friends and Family:   . Frequency of Social Gatherings with Friends and Family:   . Attends Religious Services:   . Active Member of Clubs or Organizations:   . Attends Archivist Meetings:   Marland Kitchen Marital Status:   Intimate Partner Violence:   . Fear of Current or Ex-Partner:   . Emotionally Abused:   Marland Kitchen Physically Abused:   . Sexually Abused:    Family History  Problem Relation Age of Onset  . Heart disease Father   . Cancer Father        Lung  . Arthritis Mother   . Parkinsonism Mother   . Arthritis Sister        Brother with rheumatoid arthritis  . Hypertension Brother   . Colon cancer Neg Hx   . Colon polyps Neg Hx    ROS: All systems reviewed and negative except as per HPI.   Current Outpatient Medications  Medication Sig Dispense Refill  . acetaminophen (TYLENOL) 500 MG tablet Take 500 mg by mouth every 6 (six) hours as needed for mild pain.    Marland Kitchen aspirin EC 81 MG tablet Take 81 mg by mouth daily before breakfast.      . budesonide-formoterol (SYMBICORT) 160-4.5 MCG/ACT inhaler Inhale 2 puffs into the lungs 2 (two) times daily. 3 Inhaler 3  . carvedilol (COREG) 12.5 MG tablet Take 1.5 tablets (18.75 mg total) by mouth 2 (two) times daily with a meal. 270 tablet 3  . clopidogrel (PLAVIX) 75 MG tablet TAKE (1) TABLET BY MOUTH ONCE DAILY. 90 tablet 3  . dapagliflozin propanediol (FARXIGA) 10 MG TABS tablet Take 10 mg by mouth daily before breakfast. 30 tablet 11  .  eplerenone (INSPRA) 50 MG tablet Take 1 tablet (50 mg total) by mouth daily. 30 tablet 11  . furosemide (LASIX) 40 MG tablet Take 1 tablet (40 mg total) by mouth daily. 90 tablet 3  . GuaiFENesin (MUCUS RELIEF ADULT PO) Take 2 capsules by mouth as needed.     Marland Kitchen ipratropium-albuterol (DUONEB) 0.5-2.5 (3) MG/3ML SOLN INHALE 1 VIAL VIA NEBULIZER 4 TIMES DAILY. 360 mL 3  . Multiple Vitamins-Minerals (CENTRUM SILVER ADULT 50+) TABS Take 1 tablet by mouth daily with lunch.     . nitroGLYCERIN (NITROSTAT) 0.4 MG SL tablet PLACE 1 TAB UNDER TONGUE EVERY 5 MIN IF NEEDED FOR CHEST PAIN. MAY USE 3 TIMES.NO RELIEF CALL 911. 25 tablet 1  . omeprazole (PRILOSEC) 20 MG capsule TAKE ONE CAPSULE BY MOUTH DAILY. 90 capsule 3  . PROAIR HFA 108 (90 Base) MCG/ACT inhaler INHALE 2 PUFFS BY MOUTH EVERY 4 TO 6 HOURS AS NEEDED FOR WHEEZING. 8.5 g 0  . rosuvastatin (CRESTOR) 20 MG tablet Take 1 tablet (20 mg total) by mouth daily. 90 tablet 3  . sacubitril-valsartan (ENTRESTO) 49-51 MG TAKE (1) TABLET BY MOUTH TWICE DAILY. 60 tablet 11   No current facility-administered medications for this encounter.   BP (!) 100/54   Pulse 79   Wt 82.5 kg (181 lb 12.8 oz)   SpO2 98%   BMI 26.09 kg/m  General: NAD Neck: No JVD, no thyromegaly or thyroid nodule.  Lungs: Occasional rhonchi and squeaks. Decreased breath sounds right base.  CV: Nondisplaced PMI.  Heart regular S1/S2, no S3/S4, no murmur.  No peripheral edema.  No carotid bruit.  Normal pedal pulses.  Abdomen: Soft,  nontender, no hepatosplenomegaly, no distention.  Skin: Intact without lesions or rashes.  Neurologic: Alert and oriented x 3.  Psych: Normal affect. Extremities: No clubbing or cyanosis.  HEENT: Normal.   Assessment/Plan: 1. Chronic systolic CHF: Ischemic cardiomyopathy.  Echo in 7/18 with EF 20-25%, echo 8/19 with EF 30-35%.  Echo in 2/21 showd EF 35%  Generally, NYHA class II symptoms.  He is not volume overloaded on exam.  I do not think that he has BP room given orthostatic symptoms to increase his cardiac meds.  I think that his dyspnea is primarily related to  COPD. - Continue Entresto 49/51 bid.  - Continue Lasix 40 mg daily.  Check BMET today.  - Continue Coreg 18.75 mg bid.   - Continue eplerenone 50 mg daily.   - Continue dapagliflozin.  - EF persistently low, qualifies for ICD.  He has a RBBB so not good CRT candidate.  He has not been interested in getting an ICD, think this is reasonable given age and lung cancer history.    2. CAD: s/p CABG then PCI 7/15 to distal LM and SVG-D.  Anginal equivalent in the past appears to have been dyspnea, he has never had significant chest pain.  LHC (9/18) showed patent LIMA-LAD and SVG-PDA; occluded SVG-D; 50% shelf-like LM stenosis - Continue ASA 81, Plavix, and Crestor.   3. COPD: Severe COPD by PFTs in 6/17.  Based on the last RHC/LHC, I suspect that COPD plays a major role in his mild ongoing dyspnea.   4. Hyperlipidemia: Good LDL in 1/21, 66.  5. NSCLC: Follows with Dr. Earlie Server.  He has finished chemo/radiation. Cancer appeared quiescent on most recent scans with Dr. Earlie Server.   6. Right pleural effusion: This has been chronic. S/p VATS.  7. Carotid stenosis: I will set up repeat carotid dopplers  in 7/21.   Loralie Champagne 01/07/2020

## 2020-01-07 NOTE — Patient Instructions (Signed)
No medication changes!  Labs today We will only contact you if something comes back abnormal or we need to make some changes. Otherwise no news is good news!  Your physician has requested that you have a carotid duplex. This test is an ultrasound of the carotid arteries in your neck. It looks at blood flow through these arteries that supply the brain with blood. Allow one hour for this exam. There are no restrictions or special instructions.  Your physician recommends that you schedule a follow-up appointment in: 3 months with Dr Aundra Dubin  Please call office at [phone] option [phone] if you have any questions or concerns.   At the [facility], you and your health needs are our priority. As part of our continuing mission to provide you with exceptional heart care, we have created designated Provider Care Teams. These Care Teams include your primary Cardiologist (physician) and Advanced Practice Providers (APPs- Physician Assistants and Nurse Practitioners) who all work together to provide you with the care you need, when you need it.   You may see any of the following providers on your designated Care Team at your next follow up: Marland Kitchen Dr Glori Bickers . Dr Loralie Champagne . Darrick Grinder, NP . Lyda Jester, PA . Audry Riles, PharmD   Please be sure to bring in all your medications bottles to every appointment.

## 2020-01-14 ENCOUNTER — Ambulatory Visit (INDEPENDENT_AMBULATORY_CARE_PROVIDER_SITE_OTHER): Payer: Medicare Other

## 2020-01-14 ENCOUNTER — Telehealth: Payer: Self-pay | Admitting: Pulmonary Disease

## 2020-01-14 DIAGNOSIS — Z8701 Personal history of pneumonia (recurrent): Secondary | ICD-10-CM

## 2020-01-14 NOTE — Telephone Encounter (Signed)
Spoke with pt. Feels like he could possibly have PNA. States that he woke up this morning with a discomfort in his L rib cage. Pain gets worse when he has to cough. Reports getting PNA "3-4 times a year." Pt is also having increased coughing. Denies chest tightness, wheezing, shortness of breath. Pt wants to have an xray done to make sure he isn't getting the starts of PNA. Tried to schedule the pt for a visit but we do not have any available appointments today or tomorrow.  Dr. Elsworth Soho - please advise. Thanks.

## 2020-01-14 NOTE — Telephone Encounter (Signed)
Okay to have him come in today or tomorrow for chest x-ray Please have APP review results and get back to him

## 2020-01-14 NOTE — Telephone Encounter (Signed)
Spoke with pt. He is aware of Dr. Bari Mantis response. Pt states that he will come in tomorrow morning to have this done. Order has been placed under TP as she is APP of the day tomorrow. Message will be routed to myself to make TP aware of in the morning.

## 2020-01-15 NOTE — Telephone Encounter (Signed)
Calling to get the results to his cxr. Pt can be reached at [phone].

## 2020-01-15 NOTE — Telephone Encounter (Signed)
Parrett, Fonnie Mu, NP  01/15/2020 1:56 PM EDT    Chronic changes are noted in the right lung. Appear to be stable. No sign of acute pneumonia noted If patient continues to have symptoms will need to be seen in the office for a visit for evaluation  Please contact office for sooner follow up if symptoms do not improve or worsen or seek emergency care   ------------------------------------ Spoke with pt's wife, [name]. She is aware of results and will let pt know. Nothing further was needed.

## 2020-01-15 NOTE — Telephone Encounter (Signed)
Pt came in for his Xray yesterday.  Tammy - please advise. Thanks!

## 2020-01-26 DIAGNOSIS — J449 Chronic obstructive pulmonary disease, unspecified: Secondary | ICD-10-CM | POA: Diagnosis not present

## 2020-02-11 DIAGNOSIS — H25813 Combined forms of age-related cataract, bilateral: Secondary | ICD-10-CM | POA: Diagnosis not present

## 2020-02-11 DIAGNOSIS — H0288B Meibomian gland dysfunction left eye, upper and lower eyelids: Secondary | ICD-10-CM | POA: Diagnosis not present

## 2020-02-11 DIAGNOSIS — H0288A Meibomian gland dysfunction right eye, upper and lower eyelids: Secondary | ICD-10-CM | POA: Diagnosis not present

## 2020-02-15 ENCOUNTER — Telehealth: Payer: Self-pay | Admitting: Pulmonary Disease

## 2020-02-15 DIAGNOSIS — J449 Chronic obstructive pulmonary disease, unspecified: Secondary | ICD-10-CM

## 2020-02-15 NOTE — Telephone Encounter (Signed)
Called and spoke with pt who stated he had rib pain in left rib cage which began 3 days ago. Pt states he has been doing a lot of coughing and is unable to get any phlegm up. Pt states he is concerned that he may be getting pneumonia again.   Pt denies any complaints of fever. Pt wants to know what we recommend to help with his symptoms. Pt stated if RA wants a cxr to be done, he is more than willing to come by the office as he wants to catch this early.  Dr. Elsworth Soho, please advise.

## 2020-02-15 NOTE — Telephone Encounter (Signed)
I have spoken with the pt and ordered the cxr stat for tomorrow am

## 2020-02-15 NOTE — Telephone Encounter (Signed)
Dr. Elsworth Soho was paged when message was sent. Since Dr. Elsworth Soho is working in hospital, I am going ahead and also routing this to [place] as she is APP of day. Beth, please advise.

## 2020-02-15 NOTE — Telephone Encounter (Signed)
Please order chest xray, to be done as stat order

## 2020-02-16 ENCOUNTER — Ambulatory Visit (INDEPENDENT_AMBULATORY_CARE_PROVIDER_SITE_OTHER): Payer: Medicare Other

## 2020-02-16 ENCOUNTER — Telehealth: Payer: Self-pay | Admitting: Pulmonary Disease

## 2020-02-16 DIAGNOSIS — R05 Cough: Secondary | ICD-10-CM | POA: Diagnosis not present

## 2020-02-16 DIAGNOSIS — J449 Chronic obstructive pulmonary disease, unspecified: Secondary | ICD-10-CM | POA: Diagnosis not present

## 2020-02-16 DIAGNOSIS — R0789 Other chest pain: Secondary | ICD-10-CM | POA: Diagnosis not present

## 2020-02-16 DIAGNOSIS — J90 Pleural effusion, not elsewhere classified: Secondary | ICD-10-CM | POA: Diagnosis not present

## 2020-02-16 NOTE — Progress Notes (Signed)
Patient called with results of recent chest x ray.  Per Derl Barrow NP, P[place] let patient know his CXR was stable overall from previous, chronic changes on right.  Patient verbalized understanding of results.

## 2020-02-16 NOTE — Progress Notes (Signed)
Please let patient know his CXR was stable overall from previous, chronic changes on right

## 2020-02-16 NOTE — Telephone Encounter (Signed)
Chest x-ray does not show any evidence of pneumonia If he has fever or productive cough, he can call us for antibiotic

## 2020-02-16 NOTE — Telephone Encounter (Signed)
Called and spoke with pt letting him know the info stated by Dr. Elsworth Soho and he verbalized understanding. Nothing further needed.

## 2020-02-16 NOTE — Telephone Encounter (Signed)
Patient called with results of recent x ray.

## 2020-03-02 ENCOUNTER — Other Ambulatory Visit (HOSPITAL_COMMUNITY): Payer: Self-pay | Admitting: Cardiology

## 2020-03-02 DIAGNOSIS — I6523 Occlusion and stenosis of bilateral carotid arteries: Secondary | ICD-10-CM

## 2020-03-08 ENCOUNTER — Telehealth: Payer: Self-pay | Admitting: Family Medicine

## 2020-03-08 NOTE — Telephone Encounter (Signed)
Called pt to make sure he was not having any issues and he is not states he just likes to have on hand and his is expired. I told him he needed visit and he did schedule. His last labs were 12/16/19 cmp and cbc. And on 09/07/19 he had lipid and liver. He would like inhaler sent in today if possible since his is expired.

## 2020-03-08 NOTE — Telephone Encounter (Signed)
Patient is requesting refill on albuterol inhaler to be called into [place]. Last seen 10/10/2018

## 2020-03-09 ENCOUNTER — Ambulatory Visit (HOSPITAL_COMMUNITY)
Admission: RE | Admit: 2020-03-09 | Discharge: 2020-03-09 | Disposition: A | Discharge status: Home / Self Care | Payer: Medicare Other | Source: Ambulatory Visit | Attending: Internal Medicine | Admitting: Internal Medicine

## 2020-03-09 ENCOUNTER — Other Ambulatory Visit: Payer: Self-pay

## 2020-03-09 DIAGNOSIS — I6523 Occlusion and stenosis of bilateral carotid arteries: Secondary | ICD-10-CM | POA: Diagnosis not present

## 2020-03-09 DIAGNOSIS — I714 Abdominal aortic aneurysm, without rupture, unspecified: Secondary | ICD-10-CM

## 2020-03-09 MED ORDER — ALBUTEROL SULFATE HFA 108 (90 BASE) MCG/ACT IN AERS: INHALATION_SPRAY | RESPIRATORY_TRACT | 0 refills | Status: DC

## 2020-03-09 NOTE — Telephone Encounter (Signed)
Pt.notified

## 2020-03-15 ENCOUNTER — Telehealth (HOSPITAL_COMMUNITY): Payer: Self-pay | Admitting: *Deleted

## 2020-03-16 ENCOUNTER — Other Ambulatory Visit: Payer: Self-pay

## 2020-03-16 ENCOUNTER — Ambulatory Visit (HOSPITAL_COMMUNITY)
Admission: RE | Admit: 2020-03-16 | Discharge: 2020-03-16 | Disposition: A | Discharge status: Home / Self Care | Payer: Medicare Other | Source: Ambulatory Visit | Attending: Vascular Surgery | Admitting: Vascular Surgery

## 2020-03-16 DIAGNOSIS — I714 Abdominal aortic aneurysm, without rupture, unspecified: Secondary | ICD-10-CM

## 2020-03-18 ENCOUNTER — Ambulatory Visit (INDEPENDENT_AMBULATORY_CARE_PROVIDER_SITE_OTHER): Payer: Medicare Other | Admitting: Physician Assistant

## 2020-03-18 ENCOUNTER — Other Ambulatory Visit: Payer: Self-pay

## 2020-03-18 VITALS — BP 120/62

## 2020-03-18 DIAGNOSIS — I714 Abdominal aortic aneurysm, without rupture, unspecified: Secondary | ICD-10-CM

## 2020-03-18 DIAGNOSIS — I6523 Occlusion and stenosis of bilateral carotid arteries: Secondary | ICD-10-CM

## 2020-03-18 DIAGNOSIS — Z95828 Presence of other vascular implants and grafts: Secondary | ICD-10-CM

## 2020-03-18 NOTE — Progress Notes (Signed)
Virtual Visit via Telephone Note  Referring MD:   I connected with Paul Sandoval. on 03/18/2020 by telephone and verified that I was speaking with the correct person using two identifiers. Patient was located at home and accompanied by his wife. I am located at the [facility] office.  The limitations of evaluation and management by telemedicine and the availability of in person appointments have been previously discussed with the patient and are documented in the patients chart. The patient expressed understanding and consented to proceed.  PCP: Erven Colla, DO Referring MD: Baltazar Apo, MD  Chief Complaint:follow up s/p EVAR and carotid stenosis  History of Present Illness: Paul Sandoval. is a 79 y.o. male who has undergone endovascular repair of abdominal aortic aneurysm in December 11, 2012 by Dr. Kellie Simmering. He is also followed for his carotid stenosis  He was last evaluated on 03/11/2019 by NP via Telephone encounter. At that time pt was stable from the EVAR standpoint and also had stable carotid disease  Today he denies any abdominal or back pain. He denies any history of stroke or TIA. No complaints of amaurosis fugax, slurred speech, facial drooping, weakness or numbness on one side of his body or the other  He takes a daily 81 mg ASA, Plavix, and a statin.  Former smoker, quit 1996  Past Medical History:  Diagnosis Date  . AAA (abdominal aortic aneurysm) ([place]) 2010   4.4 cm 08/2008;4.44 in 7/10 and 4.65 in 08/2009; 4.8 by CT in 11/2009; 4.3 by ultrasound in 08/2010  . Anemia   . Arteriosclerotic cardiovascular disease (ASCVD) 1996   CABG-1996  . Arthritis    "fingers" (03/18/2014)  . CAD (coronary artery disease)    03/18/14:  PCI with DES to distal left main. 7/29: DES to the SVG to Diag  . Cancer ([place])    Upper right lobe lung cancer  . Cardiomyopathy, ischemic    Echo 03/17/14: EF 45-50%  . Chronic bronchitis ([place])   . Chronic kidney disease    CRF  . Chronic  rhinitis   . Colonic polyp 2002   polypectomy in 2002  . COPD (chronic obstructive pulmonary disease) ([place])   . Diverticulosis   . Dyspnea    with exertion  . ED (erectile dysfunction)   . Encounter for antineoplastic chemotherapy 12/19/2015  . GERD (gastroesophageal reflux disease)   . History of blood transfusion   . Hyperlipidemia   . Hypertension   . IFG (impaired fasting glucose)   . Myocardial infarction [facility])    "told h/o silent MI sometime before 1996"  . Pneumonia ~ 2001; ~ 2005    has had more than twice  . Right bundle branch block   . Tobacco abuse, in remission    40 pack year total consumption; discontinued in 1996    Past Surgical History:  Procedure Laterality Date  . ABDOMINAL AORTIC ANEURYSM REPAIR  11/2012  . ABDOMINAL AORTIC ENDOVASCULAR STENT GRAFT N/A 12/11/2012   Procedure: ABDOMINAL AORTIC ENDOVASCULAR STENT GRAFT;  Surgeon: Mal Misty, MD;  Location: [facility];  Service: Vascular;  Laterality: N/A;  Ultrasound guided; [place]  . CARDIAC CATHETERIZATION  01/08/1995  . COLONOSCOPY  2002   polypectomy-patient denies  . CORONARY ANGIOPLASTY WITH STENT PLACEMENT  03/18/2014   "1"  . CORONARY ANGIOPLASTY WITH STENT PLACEMENT  03/24/2014   "1"  . CORONARY ARTERY BYPASS GRAFT  01/09/1995   "CABG X3"  . FLEXIBLE BRONCHOSCOPY N/A 03/01/2017  Procedure: FLEXIBLE BRONCHOSCOPY WITH BIOPSIES;  Surgeon: Gaye Pollack, MD;  Location: [facility] OR;  Service: Thoracic;  Laterality: N/A;  . JOINT REPLACEMENT    . LAPAROSCOPIC CHOLECYSTECTOMY  12/2009  . LEFT AND RIGHT HEART CATHETERIZATION WITH CORONARY/GRAFT ANGIOGRAM N/A 03/18/2014   Procedure: LEFT AND RIGHT HEART CATHETERIZATION WITH Beatrix Fetters;  Surgeon: Blane Ohara, MD;  Location[facility] C[facility] LAB;  Service: Cardiovascular;  Laterality: N/A;  . PERCUTANEOUS CORONARY STENT INTERVENTION (PCI-S)  03/18/2014   Procedure: PERCUTANEOUS CORONARY STENT INTERVENTION (PCI-S);  Surgeon: Blane Ohara, MD;  Location: [facility] CATH  LAB;  Service: Cardiovascular;;  . PERCUTANEOUS CORONARY STENT INTERVENTION (PCI-S) N/A 03/24/2014   Procedure: PERCUTANEOUS CORONARY STENT INTERVENTION (PCI-S);  Surgeon: Blane Ohara, MD;  Location: [facility] CATH LAB;  Service: Cardiovascular;  Laterality: N/A;  . PLEURAL EFFUSION DRAINAGE Right 03/01/2017   Procedure: DRAINAGE OF PLEURAL EFFUSION;  Surgeon: Gaye Pollack, MD;  Location: [place];  Service: Thoracic;  Laterality: Right;  . RIGHT/LEFT HEART CATH AND CORONARY/GRAFT ANGIOGRAPHY N/A 05/15/2017   Procedure: RIGHT/LEFT HEART CATH AND CORONARY/GRAFT ANGIOGRAPHY;  Surgeon: Larey Dresser, MD;  Location: [place] CV LAB;  Service: Cardiovascular;  Laterality: N/A;  . TALC PLEURODESIS Right 03/01/2017   Procedure: Pietro Cassis;  Surgeon: Gaye Pollack, MD;  Location: [place];  Service: Thoracic;  Laterality: Right;  . TOTAL HIP ARTHROPLASTY Left 01/21/2013   Procedure: TOTAL HIP ARTHROPLASTY ANTERIOR APPROACH;  Surgeon: Mauri Pole, MD;  Location: [place];  Service: Orthopedics;  Laterality: Left;  Marland Kitchen VIDEO ASSISTED THORACOSCOPY Right 03/01/2017   Procedure: VIDEO ASSISTED THORACOSCOPY WITH BIOPSIES;  Surgeon: Gaye Pollack, MD;  Location: [place];  Service: Thoracic;  Laterality: Right;  Marland Kitchen VIDEO BRONCHOSCOPY N/A 11/17/2015   Procedure: VIDEO BRONCHOSCOPY WITH FLUORO;  Surgeon: Rigoberto Noel, MD;  Location: [place];  Service: Cardiopulmonary;  Laterality: N/A;    No outpatient medications have been marked as taking for the 03/18/20 encounter (Appointment) with [facility].    12 system ROS was negative unless otherwise noted in HPI   Observations/Objective:   Endovascular Aortic Repair (EVAR):  +----------+----------------+-------------------+-------------------+       Diameter AP (cm)Diameter Trans (cm)Velocities (cm/sec)  +----------+----------------+-------------------+-------------------+  Aorta   3.55      3.63        85             +----------+----------------+-------------------+-------------------+  Right Limb1.22      1.31        155          +----------+----------------+-------------------+-------------------+  Left Limb 1.28      1.48        54           +----------+----------------+-------------------+-------------------+  Patent endovascular aneurysm repair with no evidence of endoleak. Essentially unchanged from prior study 1 year ago  03/09/20 VAS US Carotid Duplex Bilateral: Right Carotid: Velocities in the right ICA are consistent with a 40-59% stenosis.   Left Carotid: Velocities in the left ICA are consistent with a 40-59% stenosis. Non-hemodynamically significant plaque <50% noted in the CCA. The ECA appears >50% stenosed.   Vertebrals: Bilateral vertebral arteries demonstrate antegrade flow.  Subclavians: Normal flow hemodynamics were seen in bilateral subclavian  Assessment and Plan: History of EVAR 12/11/12 by Dr. Kellie Simmering. He has no referable symptoms associated with this. On recent non invasvie studies it appears stable post repair with no signs of endoleak.   His recent carotid duplex is also stable from last year.  He has no signs of TIA/ CVA  He will continue his Asprin, Statin, and Plavix  Follow Up Instructions:   Follow up 1 year with carotid duplex and EVAR duplex   I discussed the assessment and treatment plan with the patient. The patient was provided an opportunity to ask questions and all were answered. The patient agreed with the plan and demonstrated an understanding of the instructions.   The patient was advised to call back or seek an in-person evaluation if the symptoms worsen or if the condition fails to improve as anticipated.  I spent 8 minutes with the patient via telephone encounter.   Signed, Karoline Caldwell PA-C [facility]: [phone]  03/18/2020, 10:56 AM

## 2020-03-24 ENCOUNTER — Encounter: Payer: Self-pay | Admitting: Family Medicine

## 2020-03-24 ENCOUNTER — Ambulatory Visit (INDEPENDENT_AMBULATORY_CARE_PROVIDER_SITE_OTHER): Payer: Medicare Other | Admitting: Family Medicine

## 2020-03-24 ENCOUNTER — Other Ambulatory Visit: Payer: Self-pay

## 2020-03-24 VITALS — BP 126/70 | HR 67 | Temp 96.8°F | Ht 70.0 in | Wt 181.2 lb

## 2020-03-24 DIAGNOSIS — N183 Chronic kidney disease, stage 3 unspecified: Secondary | ICD-10-CM

## 2020-03-24 DIAGNOSIS — I2510 Atherosclerotic heart disease of native coronary artery without angina pectoris: Secondary | ICD-10-CM | POA: Diagnosis not present

## 2020-03-24 DIAGNOSIS — C3411 Malignant neoplasm of upper lobe, right bronchus or lung: Secondary | ICD-10-CM | POA: Diagnosis not present

## 2020-03-24 DIAGNOSIS — I5042 Chronic combined systolic (congestive) and diastolic (congestive) heart failure: Secondary | ICD-10-CM | POA: Diagnosis not present

## 2020-03-24 NOTE — Progress Notes (Signed)
Patient ID: Paul Siefring., male    DOB: 1940/12/26, 79 y.o.   MRN: [id number]   Chief Complaint  Patient presents with  . Establish Care   Subjective:    HPI  Pt seen for establish care.  Pt has no issues or concerns at this time. Pt seeing [place] for pcp, but goes to [place] in [place] 1x per year to get his prescription benefits.  1996 had open heart surgery.  Hip replacement. Heart stents. H/o AAA H/o right upper lung cancer treated rad/chemo. F/u with oncology at [facility] long cancer center seeing q84mo and repeat ct scan q75mo. Seeing HF group with Dr. Tami Ribas, seeing q3-35mo prn.  Seeing pcp in [place], [place] to get meds through the [place], seeing the [facility]. To get medication beneftis.  uptodate- covid vaccine in 1/21 and 2/21. Wearing compression stocks past 2 months. Helping leg swelling.  Slight inc in Cr and will cont to observe it. Taking lasix for leg swelling and chf.  occ getting changes in mucous will come in for infections occ. Pt brings in sputum culture to lung doctor; has been given levaquin and augmentin in past by Dr. Wolfgang Phoenix. Has had some resistant bacteria in lungs.  Seeing pulm for this.  Medical History Shannan has a past medical history of AAA (abdominal aortic aneurysm) ([place]) (2010), Anemia, Arteriosclerotic cardiovascular disease (ASCVD) (1996), Arthritis, CAD (coronary artery disease), Cancer ([place]), Cardiomyopathy, ischemic, Chronic bronchitis ([place]), Chronic kidney disease, Chronic rhinitis, Colonic polyp (2002), COPD (chronic obstructive pulmonary disease) ([place]), Diverticulosis, Dyspnea, ED (erectile dysfunction), Encounter for antineoplastic chemotherapy (12/19/2015), GERD (gastroesophageal reflux disease), History of blood transfusion, Hyperlipidemia, Hypertension, IFG (impaired fasting glucose), Myocardial infarction ([place]), Pneumonia (~ 2001; ~ 2005), Right bundle branch block, and Tobacco abuse, in remission.   Outpatient Encounter Medications as of  03/24/2020  Medication Sig  . acetaminophen (TYLENOL) 500 MG tablet Take 500 mg by mouth every 6 (six) hours as needed for mild pain.  Marland Kitchen albuterol (PROAIR HFA) 108 (90 Base) MCG/ACT inhaler INHALE 2 PUFFS BY MOUTH EVERY 4 TO 6 HOURS AS NEEDED FOR WHEEZING.  Marland Kitchen aspirin EC 81 MG tablet Take 81 mg by mouth daily before breakfast.   . carvedilol (COREG) 12.5 MG tablet Take 1.5 tablets (18.75 mg total) by mouth 2 (two) times daily with a meal.  . clopidogrel (PLAVIX) 75 MG tablet TAKE (1) TABLET BY MOUTH ONCE DAILY.  . dapagliflozin propanediol (FARXIGA) 10 MG TABS tablet Take 10 mg by mouth daily before breakfast.  . eplerenone (INSPRA) 50 MG tablet Take 1 tablet (50 mg total) by mouth daily.  . furosemide (LASIX) 40 MG tablet Take 1 tablet (40 mg total) by mouth daily.  . GuaiFENesin (MUCUS RELIEF ADULT PO) Take 2 capsules by mouth as needed.   Marland Kitchen ipratropium-albuterol (DUONEB) 0.5-2.5 (3) MG/3ML SOLN INHALE 1 VIAL VIA NEBULIZER 4 TIMES DAILY.  . Multiple Vitamins-Minerals (CENTRUM SILVER ADULT 50+) TABS Take 1 tablet by mouth daily with lunch.   . nitroGLYCERIN (NITROSTAT) 0.4 MG SL tablet PLACE 1 TAB UNDER TONGUE EVERY 5 MIN IF NEEDED FOR CHEST PAIN. MAY USE 3 TIMES.NO RELIEF CALL 911.  . omeprazole (PRILOSEC) 20 MG capsule TAKE ONE CAPSULE BY MOUTH DAILY.  . sacubitril-valsartan (ENTRESTO) 49-51 MG TAKE (1) TABLET BY MOUTH TWICE DAILY.  . budesonide-formoterol (SYMBICORT) 160-4.5 MCG/ACT inhaler Inhale 2 puffs into the lungs 2 (two) times daily.  . rosuvastatin (CRESTOR) 20 MG tablet Take 1 tablet (20 mg total) by mouth daily.  No facility-administered encounter medications on file as of 03/24/2020.     Review of Systems  Constitutional: Negative for chills and fever.  HENT: Negative for congestion, rhinorrhea and sore throat.   Respiratory: Negative for cough, shortness of breath and wheezing.   Cardiovascular: Negative for chest pain and leg swelling.  Gastrointestinal: Negative for  abdominal pain, diarrhea, nausea and vomiting.  Genitourinary: Negative for dysuria and frequency.  Skin: Negative for rash.  Neurological: Negative for dizziness, weakness and headaches.     Vitals BP 126/70   Pulse 67   Temp (!) 96.8 F (36 C)   Ht 5\' 10"  (1.778 m)   Wt 181 lb 3.2 oz (82.2 kg)   SpO2 97%   BMI 26.00 kg/m   Objective:   Physical Exam Vitals and nursing note reviewed.  Constitutional:      General: He is not in acute distress.    Appearance: Normal appearance. He is not ill-appearing.  HENT:     Head: Normocephalic.  Eyes:     Extraocular Movements: Extraocular movements intact.     Conjunctiva/sclera: Conjunctivae normal.     Pupils: Pupils are equal, round, and reactive to light.  Cardiovascular:     Rate and Rhythm: Normal rate and regular rhythm.     Pulses: Normal pulses.     Heart sounds: Normal heart sounds. No murmur heard.   Pulmonary:     Effort: Pulmonary effort is normal.     Breath sounds: Normal breath sounds. No wheezing, rhonchi or rales.  Musculoskeletal:        General: Normal range of motion.     Right lower leg: No edema.     Left lower leg: No edema.  Skin:    General: Skin is warm and dry.     Findings: No rash.  Neurological:     General: No focal deficit present.     Mental Status: He is alert and oriented to person, place, and time.     Cranial Nerves: No cranial nerve deficit.  Psychiatric:        Mood and Affect: Mood normal.        Behavior: Behavior normal.        Thought Content: Thought content normal.        Judgment: Judgment normal.      Assessment and Plan   1. Chronic combined systolic and diastolic congestive heart failure (HCC)  2. Stage 3 chronic kidney disease, unspecified whether stage 3a or 3b CKD  3. Coronary artery disease involving native coronary artery of native heart without angina pectoris  4. Primary cancer of right upper lobe of lung (HCC)   CHF and CAD- stable, controlled.   CKD  stage 3- cont to monitor.  H/o right upper lobe lung cancer- stable. cont f/u oncology.   F/u with oncology, cardiology and pulmonology as scheduled. Most meds are from [place] and gets a few from here.  Pt is up to date on covid vaccines.  F/u 74mo or prn.

## 2020-03-28 ENCOUNTER — Telehealth: Payer: Self-pay | Admitting: Internal Medicine

## 2020-03-28 NOTE — Telephone Encounter (Signed)
R/s appt per 8/2 sch msg - unable to reach pt . Left message with new appt time

## 2020-03-29 DIAGNOSIS — J449 Chronic obstructive pulmonary disease, unspecified: Secondary | ICD-10-CM | POA: Diagnosis not present

## 2020-04-05 ENCOUNTER — Other Ambulatory Visit: Payer: Self-pay

## 2020-04-05 ENCOUNTER — Ambulatory Visit (HOSPITAL_COMMUNITY)
Admission: RE | Admit: 2020-04-05 | Discharge: 2020-04-05 | Disposition: A | Discharge status: Home / Self Care | Payer: Medicare Other | Source: Ambulatory Visit | Attending: Cardiology | Admitting: Cardiology

## 2020-04-05 ENCOUNTER — Encounter (HOSPITAL_COMMUNITY): Payer: Self-pay | Admitting: Cardiology

## 2020-04-05 VITALS — BP 110/58 | HR 68 | Wt 180.6 lb

## 2020-04-05 DIAGNOSIS — Z7982 Long term (current) use of aspirin: Secondary | ICD-10-CM | POA: Diagnosis not present

## 2020-04-05 DIAGNOSIS — C3490 Malignant neoplasm of unspecified part of unspecified bronchus or lung: Secondary | ICD-10-CM | POA: Insufficient documentation

## 2020-04-05 DIAGNOSIS — J90 Pleural effusion, not elsewhere classified: Secondary | ICD-10-CM | POA: Insufficient documentation

## 2020-04-05 DIAGNOSIS — N183 Chronic kidney disease, stage 3 unspecified: Secondary | ICD-10-CM | POA: Insufficient documentation

## 2020-04-05 DIAGNOSIS — E785 Hyperlipidemia, unspecified: Secondary | ICD-10-CM | POA: Insufficient documentation

## 2020-04-05 DIAGNOSIS — I4510 Unspecified right bundle-branch block: Secondary | ICD-10-CM | POA: Diagnosis not present

## 2020-04-05 DIAGNOSIS — I5022 Chronic systolic (congestive) heart failure: Secondary | ICD-10-CM | POA: Insufficient documentation

## 2020-04-05 DIAGNOSIS — Z87891 Personal history of nicotine dependence: Secondary | ICD-10-CM | POA: Diagnosis not present

## 2020-04-05 DIAGNOSIS — I130 Hypertensive heart and chronic kidney disease with heart failure and stage 1 through stage 4 chronic kidney disease, or unspecified chronic kidney disease: Secondary | ICD-10-CM | POA: Insufficient documentation

## 2020-04-05 DIAGNOSIS — Z8249 Family history of ischemic heart disease and other diseases of the circulatory system: Secondary | ICD-10-CM | POA: Insufficient documentation

## 2020-04-05 DIAGNOSIS — I2510 Atherosclerotic heart disease of native coronary artery without angina pectoris: Secondary | ICD-10-CM | POA: Diagnosis not present

## 2020-04-05 DIAGNOSIS — Z7902 Long term (current) use of antithrombotics/antiplatelets: Secondary | ICD-10-CM | POA: Diagnosis not present

## 2020-04-05 DIAGNOSIS — Z7984 Long term (current) use of oral hypoglycemic drugs: Secondary | ICD-10-CM | POA: Insufficient documentation

## 2020-04-05 DIAGNOSIS — Z79899 Other long term (current) drug therapy: Secondary | ICD-10-CM | POA: Insufficient documentation

## 2020-04-05 DIAGNOSIS — I255 Ischemic cardiomyopathy: Secondary | ICD-10-CM | POA: Insufficient documentation

## 2020-04-05 DIAGNOSIS — Z955 Presence of coronary angioplasty implant and graft: Secondary | ICD-10-CM | POA: Insufficient documentation

## 2020-04-05 DIAGNOSIS — R5383 Other fatigue: Secondary | ICD-10-CM | POA: Diagnosis not present

## 2020-04-05 DIAGNOSIS — J449 Chronic obstructive pulmonary disease, unspecified: Secondary | ICD-10-CM | POA: Insufficient documentation

## 2020-04-05 DIAGNOSIS — I5042 Chronic combined systolic (congestive) and diastolic (congestive) heart failure: Secondary | ICD-10-CM

## 2020-04-05 LAB — BASIC METABOLIC PANEL
Anion gap: 10 (ref 5–15)
BUN: 25 mg/dL — ABNORMAL HIGH (ref 8–23)
CO2: 26 mmol/L (ref 22–32)
Calcium: 9.1 mg/dL (ref 8.9–10.3)
Chloride: 100 mmol/L (ref 98–111)
Creatinine, Ser: 1.75 mg/dL — ABNORMAL HIGH (ref 0.61–1.24)
GFR calc Af Amer: 42 mL/min — ABNORMAL LOW (ref >60)
GFR calc non Af Amer: 36 mL/min — ABNORMAL LOW (ref >60)
Glucose, Bld: 94 mg/dL (ref 70–99)
Potassium: 4.4 mmol/L (ref 3.5–5.1)
Sodium: 136 mmol/L (ref 135–145)

## 2020-04-05 MED ORDER — EPLERENONE 50 MG PO TABS: 50.0000 mg | ORAL_TABLET | Freq: Every evening | ORAL | 11 refills | Status: AC

## 2020-04-05 NOTE — Patient Instructions (Signed)
Change Eplerenone to every evening  Labs done today, we will call you for abnormal results  Your physician recommends that you schedule a follow-up appointment in: 4 months  If you have any questions or concerns before your next appointment please send [place] a message through [place] or call our office at [phone].    TO LEAVE A MESSAGE FOR THE NURSE SELECT OPTION 2, PLEASE LEAVE A MESSAGE INCLUDING: . YOUR NAME . DATE OF BIRTH . CALL BACK NUMBER . REASON FOR CALL**this is important as we prioritize the call backs  [place] AS LONG AS YOU CALL BEFORE 4:00 PM  At the [facility], you and your health needs are our priority. As part of our continuing mission to provide you with exceptional heart care, we have created designated Provider Care Teams. These Care Teams include your primary Cardiologist (physician) and Advanced Practice Providers (APPs- Physician Assistants and Nurse Practitioners) who all work together to provide you with the care you need, when you need it.   You may see any of the following providers on your designated Care Team at your next follow up: Marland Kitchen Dr Glori Bickers . Dr Loralie Champagne . Darrick Grinder, NP . Lyda Jester, PA . Audry Riles, PharmD   Please be sure to bring in all your medications bottles to every appointment.

## 2020-04-05 NOTE — Progress Notes (Signed)
PCP: Dr. Wolfgang Phoenix Pulmonary: Dr. Lenna Gilford HF Cardiology: Dr. Aundra Dubin  79 y.o. with history of COPD, CAD s/p CABG in 1996 and PCI to dLM and SVG-D in 7/15, ischemic cardiomyopathy, and non-small cell lung cancer presents for followup of CHF and dyspnea.  He was initially referred to [place] by Dr. Lenna Gilford after fall in EF and worsening dyspnea was noted.   Patient reports that his initial symptoms prior to CABG in 1996 were dyspnea.  He has never had any worrisome chest pain.  He did well initially after CABG and was golfing regularly and walking 2-3 miles/day up until around 3/17.  He had one episode of increased dyspnea in 2015 and had cath with PCI to distal LM and SVG-D.  In 3/17, he was diagnosed with NSCLC and was treated with radiation and chemotherapy.  Since then, he has had progressive exertional dyspnea.  He was noted on echo in 7/18 to have EF down to 20-25%.    RHC/LHC was done in 9/18, showing occluded SVG-small D, patent LIMA-LAD and SVG-PDA, 50% shelf-like stenosis distal left main (no intervention), filling pressure not elevated and cardiac output preserved.   Echo in 8/19 showed that EF remains 30-35%.  Echo in 2/21 showed EF 35%, mildly decreased RV systolic function.   Patient returns for followup of CAD and CHF.  He has remained very active.  He weed-eats, mows his lawn, and trims the hedge.  He gets fatigued after about 15 minutes of moderately strenuous activity.  No chest pain.  No orthopnea/PND.  Weight is down 1 lb.   Labs (9/17): LDL 90 Labs (7/18): K 3.5, creatinine 1.28, hgb 10.7 Labs (8/18): LDL 68 Labs (9/18): K 4.1, creatinine 1.2, hgb 11.1, plts 98 K Labs (10/18): K 4.1, creatinine 1.5 Labs (1/19): K 4.3, creatinine 1.54, LDL 80, HDL 49, LFTs normal, TSH normal Labs (4/19): K 4.3, creatinine 1.52, hgb 11.8 Labs (7/19): K 4.1, creatinine 1.5, LDL 75, LFTs normal, hgb 12.2 Labs (10/19): K 4.3, creatinine 1.53 Labs (12/19): K 4.1, creatinine 1.49 Labs (10/20): K 4.4,  creatinine 1.67  Labs (1/21): LDL 66 Labs (12/20): K 4.1, creatinine 1.7 Labs (4/21): K 4.1, creatinine 1.71 Labs (5/21): K 4.5, creatinine 1.81  ECG (personally reviewed): NSR, RBBB  PMH: 1. COPD: PFTs (6/17) with severe obstructive airways disease.  2. AAA: s/p repair with stent graft.  3. Non-small cell lung cancer: Stage IIIA, diagnosed 3/17.  He had radiation as well as chemotherapy with carboplatin and paclitaxel.   4. CKD stage 3 5. PNA in 6/17 6. Recurrent right pleural effusion: VATS with talc pleurodesis on right.  No malignant cells noted.  7. HTN 8. H/o THR 9. CAD: CABG 1996 with SVG-D, SVG-PDA, LIMA-LAD.   - LHC (7/15): Totally occluded RCA and LAD.  Severe distal LM stenosis.  Patent SVG-PDA, patent LIMA-LAD.  Severe disease SVG-D.  Patient had DES to distal left main and staged PCI of SVG-D.   - Cardiolite (7/17): EF 30%, prior infarction with no ischemia.  - LHC (9/18): occluded SVG-small D, patent LIMA-LAD and SVG-PDA, 50% shelf-like stenosis distal left main (no intervention).  10. Chronic systolic CHF: Ischemic cardiomyopathy.   - Echo (6/17): EF 30-35%. - Echo (7/18): EF 20-25%, moderate RV dilation with mildly decreased RV systolic function.  - RHC (9/18): mean RA 3, PA 28/9 mean 18, mean PCWP 10, CI 4.05.  - Echo (8/19): EF 30-35%, mildly decreased RV systolic function.   - Echo (2/21): EF 35%, mildly decreased RV  systolic function 11. Carotid stenosis: Carotid dopplers (7/20) with 40-59% BICA stenosis.  - Carotid dopplers (7/21): 40-59% BICA stenosis.   Social History   Socioeconomic History  . Marital status: Married    Spouse name: Not on file  . Number of children: 1  . Years of education: Not on file  . Highest education level: Not on file  Occupational History  . Occupation: Retired    Comment: [employer]  . Smoking status: Former Smoker    Packs/day: 1.50    Years: 30.00    Pack years: 45.00    Types: Cigarettes     Start date: 12/01/1956    Quit date: 01/08/1995    Years since quitting: 25.2  . Smokeless tobacco: Never Used  Vaping Use  . Vaping Use: Never used  Substance and Sexual Activity  . Alcohol use: No    Alcohol/week: 0.0 standard drinks    Comment: 03/18/2014 "no alacohol since 1996"  . Drug use: No  . Sexual activity: Never  Other Topics Concern  . Not on file  Social History Narrative  . Not on file   Social Determinants of Health   Financial Resource Strain:   . Difficulty of Paying Living Expenses:   Food Insecurity:   . Worried About Charity fundraiser in the Last Year:   . Arboriculturist in the Last Year:   Transportation Needs:   . Film/video editor (Medical):   Marland Kitchen Lack of Transportation (Non-Medical):   Physical Activity:   . Days of Exercise per Week:   . Minutes of Exercise per Session:   Stress:   . Feeling of Stress :   Social Connections:   . Frequency of Communication with Friends and Family:   . Frequency of Social Gatherings with Friends and Family:   . Attends Religious Services:   . Active Member of Clubs or Organizations:   . Attends Archivist Meetings:   Marland Kitchen Marital Status:   Intimate Partner Violence:   . Fear of Current or Ex-Partner:   . Emotionally Abused:   Marland Kitchen Physically Abused:   . Sexually Abused:    Family History  Problem Relation Age of Onset  . Heart disease Father   . Cancer Father        Lung  . Arthritis Mother   . Parkinsonism Mother   . Arthritis Sister        Brother with rheumatoid arthritis  . Hypertension Brother   . Colon cancer Neg Hx   . Colon polyps Neg Hx    ROS: All systems reviewed and negative except as per HPI.   Current Outpatient Medications  Medication Sig Dispense Refill  . acetaminophen (TYLENOL) 500 MG tablet Take 500 mg by mouth every 6 (six) hours as needed for mild pain.    Marland Kitchen albuterol (PROAIR HFA) 108 (90 Base) MCG/ACT inhaler INHALE 2 PUFFS BY MOUTH EVERY 4 TO 6 HOURS AS NEEDED FOR  WHEEZING. 8.5 g 0  . aspirin EC 81 MG tablet Take 81 mg by mouth daily before breakfast.     . budesonide-formoterol (SYMBICORT) 160-4.5 MCG/ACT inhaler Inhale 2 puffs into the lungs 2 (two) times daily. 3 Inhaler 3  . carvedilol (COREG) 12.5 MG tablet Take 1.5 tablets (18.75 mg total) by mouth 2 (two) times daily with a meal. 270 tablet 3  . clopidogrel (PLAVIX) 75 MG tablet TAKE (1) TABLET BY MOUTH ONCE DAILY. 90 tablet 3  . dapagliflozin  propanediol (FARXIGA) 10 MG TABS tablet Take 10 mg by mouth daily before breakfast. 30 tablet 11  . eplerenone (INSPRA) 50 MG tablet Take 1 tablet (50 mg total) by mouth every evening. 30 tablet 11  . furosemide (LASIX) 40 MG tablet Take 1 tablet (40 mg total) by mouth daily. 90 tablet 3  . GuaiFENesin (MUCUS RELIEF ADULT PO) Take 2 capsules by mouth as needed.     Marland Kitchen ipratropium-albuterol (DUONEB) 0.5-2.5 (3) MG/3ML SOLN INHALE 1 VIAL VIA NEBULIZER 4 TIMES DAILY. 360 mL 3  . Multiple Vitamins-Minerals (CENTRUM SILVER ADULT 50+) TABS Take 1 tablet by mouth daily with lunch.     . nitroGLYCERIN (NITROSTAT) 0.4 MG SL tablet PLACE 1 TAB UNDER TONGUE EVERY 5 MIN IF NEEDED FOR CHEST PAIN. MAY USE 3 TIMES.NO RELIEF CALL 911. 25 tablet 1  . omeprazole (PRILOSEC) 20 MG capsule TAKE ONE CAPSULE BY MOUTH DAILY. 90 capsule 3  . rosuvastatin (CRESTOR) 20 MG tablet Take 1 tablet (20 mg total) by mouth daily. 90 tablet 3  . sacubitril-valsartan (ENTRESTO) 49-51 MG TAKE (1) TABLET BY MOUTH TWICE DAILY. 60 tablet 11   No current facility-administered medications for this encounter.   BP (!) 110/58   Pulse 68   Wt 81.9 kg (180 lb 9.6 oz)   SpO2 98%   BMI 25.91 kg/m  General: NAD Neck: No JVD, no thyromegaly or thyroid nodule.  Lungs: Clear to auscultation bilaterally with normal respiratory effort. CV: Nondisplaced PMI.  Heart regular S1/S2, no S3/S4, no murmur.  No peripheral edema.  No carotid bruit.  Normal pedal pulses.  Abdomen: Soft, nontender, no  hepatosplenomegaly, no distention.  Skin: Intact without lesions or rashes.  Neurologic: Alert and oriented x 3.  Psych: Normal affect. Extremities: No clubbing or cyanosis.  HEENT: Normal.   Assessment/Plan: 1. Chronic systolic CHF: Ischemic cardiomyopathy.  Echo in 7/18 with EF 20-25%, echo 8/19 with EF 30-35%.  Echo in 2/21 showd EF 35%  Generally, NYHA class II symptoms.  He is not volume overloaded on exam.  I do not think that he has BP room given orthostatic symptoms to increase his cardiac meds.  I think that his dyspnea is primarily related to  COPD. - Continue Entresto 49/51 bid.  - Continue Lasix 40 mg daily.  Check BMET today.  - Continue Coreg 18.75 mg bid.   - Continue eplerenone 50 mg daily.   - Continue dapagliflozin 10 mg daily.  - EF persistently low, qualifies for ICD.  He has a RBBB so not good CRT candidate.  He has not been interested in getting an ICD, think this is reasonable given age and lung cancer history.    2. CAD: s/p CABG then PCI 7/15 to distal LM and SVG-D.  Anginal equivalent in the past appears to have been dyspnea, he has never had significant chest pain.  LHC (9/18) showed patent LIMA-LAD and SVG-PDA; occluded SVG-D; 50% shelf-like LM stenosis - Continue ASA 81, Plavix, and Crestor.   3. COPD: Severe COPD by PFTs in 6/17.  Based on the last RHC/LHC, I suspect that COPD plays a major role in his mild ongoing dyspnea.   4. Hyperlipidemia: Good LDL in 1/21, 66.  5. NSCLC: Follows with Dr. Earlie Server.  He has finished chemo/radiation. Cancer appeared quiescent on most recent scans with Dr. Earlie Server.   6. Right pleural effusion: This has been chronic. S/p VATS.  7. Carotid stenosis: Due for repeat dopplers in 7/22.   Followup in 4 months.  Loralie Champagne 04/05/2020

## 2020-04-20 ENCOUNTER — Telehealth (HOSPITAL_COMMUNITY): Payer: Self-pay | Admitting: Cardiology

## 2020-04-20 ENCOUNTER — Telehealth: Payer: Self-pay | Admitting: Medical Oncology

## 2020-04-20 NOTE — Telephone Encounter (Signed)
Per CHF clinic providers ok to proceed with booster shots per CDC guidelines.   Pt aware and voiced understanding

## 2020-04-20 NOTE — Telephone Encounter (Signed)
Pt asking if it is okay to get COVID booster vaccine. I lvm  It is okay.

## 2020-04-20 NOTE — Telephone Encounter (Signed)
Patient stated he is scheduled to receive the Covid booster shot in two days and he has to get verbal consent from DM stating that is ok for him to take the shot, pt can be reached [phone], please advise

## 2020-04-21 ENCOUNTER — Other Ambulatory Visit: Payer: Self-pay

## 2020-04-21 ENCOUNTER — Ambulatory Visit (INDEPENDENT_AMBULATORY_CARE_PROVIDER_SITE_OTHER): Payer: Medicare Other | Admitting: Pulmonary Disease

## 2020-04-21 ENCOUNTER — Encounter: Payer: Self-pay | Admitting: Pulmonary Disease

## 2020-04-21 DIAGNOSIS — J449 Chronic obstructive pulmonary disease, unspecified: Secondary | ICD-10-CM

## 2020-04-21 DIAGNOSIS — C3411 Malignant neoplasm of upper lobe, right bronchus or lung: Secondary | ICD-10-CM | POA: Diagnosis not present

## 2020-04-21 NOTE — Assessment & Plan Note (Signed)
No evidence of recurrence, last CT scan was reviewed. Loculated right effusion persists post VATS procedure

## 2020-04-21 NOTE — Patient Instructions (Signed)
Continue symbicort Use mucinex for excess mucus production CT scan was stable

## 2020-04-21 NOTE — Assessment & Plan Note (Addendum)
Continue Symbicort 2 puffs twice daily. Continue current regimen of duo nebs twice daily. He is really not needed his rescue inhaler much He will continue to use Mucinex periodically for mucus production Cough may be related to [facility] but he does need this medication  We discussed implications of Covid booster vaccine and symptoms of Covid infection

## 2020-04-21 NOTE — Progress Notes (Signed)
   Subjective:    Patient ID: Jolee Ewing., male    DOB: 1941-06-30, 79 y.o.   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 Takes duo nebs before taking Symbicort each time. Remains on Entresto, no pedal edema, orthopnea or proximal nocturnal dyspnea.  He does have frequent throat clearing, mucus production mostly white but occasionally yellow for which he will take Mucinex.  He has several questions related to Covid booster vaccine and symptoms in general, he has been vaccinated   Significant tests/ events reviewed  11/2019 CT chest no evidence of recurrence, right upper lobe fibrosis stable, chronic loculated right pleural effusion  05/2019 CT chest  Stable appearance of mildly enlarged subcarinal lymph node. Stable loculated effusions post talc pleurodesis and paramediastinal and right upper lobe fibrosis post radiation  04/09/2018-echocardiogram-LV ejection fraction 25 to 30%, diffuse hypokinesis,  02/01/2016-pulmonary function test-FVC 1.96 (45% predicted), postbronchodilator ratio of 67, postbronchodilator FEV1 1.64 (53% predicted, postbronchodilator response, significant mid flow reversibility, DLCO 58  Review of Systems Patient denies significant dyspnea,cough, hemoptysis,  chest pain, palpitations, pedal edema, orthopnea, paroxysmal nocturnal dyspnea, lightheadedness, nausea, vomiting, abdominal or  leg pains      Objective:   Physical Exam   Gen. Pleasant, elderly,, in no distress ENT - no lesions, no post nasal drip Neck: No JVD, no thyromegaly, no carotid bruits Lungs: no use of accessory muscles, no dullness to percussion, decreased without rales or  rhonchi  Cardiovascular: Rhythm regular, heart sounds  normal, no murmurs or gallops, no peripheral edema Musculoskeletal: No deformities, no cyanosis or clubbing , no tremors        Assessment & Plan:

## 2020-04-30 DIAGNOSIS — J449 Chronic obstructive pulmonary disease, unspecified: Secondary | ICD-10-CM | POA: Diagnosis not present

## 2020-05-03 ENCOUNTER — Telehealth: Payer: Self-pay | Admitting: Pulmonary Disease

## 2020-05-03 DIAGNOSIS — J449 Chronic obstructive pulmonary disease, unspecified: Secondary | ICD-10-CM

## 2020-05-03 NOTE — Telephone Encounter (Signed)
Called and spoke with patient he states that he needs a new order for Nebulizer kits for his machine. Order will be sent to [facility]. Nothing further needed at this time.

## 2020-05-09 ENCOUNTER — Other Ambulatory Visit: Payer: Self-pay | Admitting: Pulmonary Disease

## 2020-05-09 ENCOUNTER — Telehealth: Payer: Self-pay | Admitting: Pulmonary Disease

## 2020-05-09 MED ORDER — DOXYCYCLINE HYCLATE 100 MG PO TABS
100.0000 mg | ORAL_TABLET | Freq: Two times a day (BID) | ORAL | 0 refills | Status: DC
Start: 2020-05-09 — End: 2020-05-20

## 2020-05-09 NOTE — Telephone Encounter (Signed)
Called and spoke with patient to let him know we were sending in RX for Doxy for next 7 days. If he did not feel better after completing for him to give [place] a call to set up an appointment. Patient expressed understanding. RX sent to preferred pharmacy. Nothing further needed at this time.

## 2020-05-09 NOTE — Telephone Encounter (Signed)
Primary Pulmonologist: Elsworth Soho Last office visit and with whom: 04/21/20 What do we see them for (pulmonary problems): COPD Last OV assessment/plan:  Assessment & Plan:      Assessment & Plan Note by Rigoberto Noel, MD at 04/21/2020 10:21 AM Author: Rigoberto Noel, MD Author Type: Physician Filed: 04/21/2020 10:23 AM  Note Status: Bernell List: Cosign Not Required Encounter Date: 04/21/2020  Problem: Chronic obstructive airway disease with asthma [facility])  Editor: Rigoberto Noel, MD (Physician)      Prior Versions: 1. Rigoberto Noel, MD (Physician) at 04/21/2020 10:21 AM - Written      Continue Symbicort 2 puffs twice daily. Continue current regimen of duo nebs twice daily. He is really not needed his rescue inhaler much He will continue to use Mucinex periodically for mucus production Cough may be related to [facility] but he does need this medication  We discussed implications of Covid booster vaccine and symptoms of Covid infection    Assessment & Plan Note by Rigoberto Noel, MD at 04/21/2020 10:22 AM Author: Rigoberto Noel, MD Author Type: Physician Filed: 04/21/2020 10:22 AM  Note Status: Written Cosign: Cosign Not Required Encounter Date: 04/21/2020  Problem: Primary cancer of right upper lobe of lung [facility])  Editor: Rigoberto Noel, MD (Physician)                 No evidence of recurrence, last CT scan was reviewed. Loculated right effusion persists post VATS procedure    Patient Instructions by Rigoberto Noel, MD at 04/21/2020 10:00 AM Author: Rigoberto Noel, MD Author Type: Physician Filed: 04/21/2020 10:16 AM  Note Status: Signed Cosign: Cosign Not Required Encounter Date: 04/21/2020  Editor: Rigoberto Noel, MD (Physician)                 Continue symbicort Use mucinex for excess mucus production CT scan was stable     Instructions    Return in about 6 months (around 10/22/2020) for BM. Continue symbicort Use mucinex for excess mucus production CT scan was stable           After Visit Summary (Printed 04/21/2020)   Was appointment offered to patient (explain)?  No, routing to physician for advise.   Reason for call: Cough for the last 2 days, unable to cough up phlem, when he does get some up, it is yellow.  Denies any fever, chills, muscle aches.  Using inhalers and nebulizer as prescribed.  Using Robitussin DM for cough.  He is requesting an antibiotic to break up.  (examples of things to ask: : When did symptoms start? Fever? Cough? Productive? Color to sputum? More sputum than usual? Wheezing? Have you needed increased oxygen? Are you taking your respiratory medications? What over the counter measures have you tried?)  Allergies  Allergen Reactions  . Neomycin Hives    Immunization History  Administered Date(s) Administered  . Fluad Quad(high Dose 65+) 05/19/2019  . H1N1 08/06/2008  . Influenza Split 06/24/2013  . Influenza,inj,Quad PF,6+ Mos 06/15/2014, 05/31/2015, 05/09/2016, 06/06/2017, 06/10/2018  . Influenza-Unspecified 05/27/2012  . Moderna SARS-COVID-2 Vaccination 09/11/2019, 10/06/2019  . Pneumococcal Conjugate-13 08/18/2014  . Pneumococcal Polysaccharide-23 03/27/2012  . Zoster 07/27/2008

## 2020-05-09 NOTE — Telephone Encounter (Signed)
DOxy 100 bid x 7 days

## 2020-05-19 ENCOUNTER — Telehealth: Payer: Self-pay | Admitting: *Deleted

## 2020-05-19 NOTE — Telephone Encounter (Signed)
Patient scheduled office visit tomorrow with Dr Lovena Le.

## 2020-05-19 NOTE — Telephone Encounter (Signed)
Patient called and stated he say his pulmonologist recently and was given 7 days of doxycycline and finished the med on 05/16/20 Patient states he just feels run down and weaker than normal and is requesting full blood work up including anemia work up and creatinine. Patient states he has a history of anemia and renal insufficiency and just wants to make sure he is ok. Patient states he was just recently seen in our office and not due a recheck till after the first of the year,

## 2020-05-19 NOTE — Telephone Encounter (Signed)
Needs to be seen.   Even though not due for routine check up till jan, needs to come in for sick visit.    Thx.    Dr. Lovena Le

## 2020-05-20 ENCOUNTER — Other Ambulatory Visit (HOSPITAL_COMMUNITY)
Admission: RE | Admit: 2020-05-20 | Discharge: 2020-05-20 | Disposition: A | Discharge status: Home / Self Care | Payer: Medicare Other | Source: Ambulatory Visit | Attending: Family Medicine | Admitting: Family Medicine

## 2020-05-20 ENCOUNTER — Ambulatory Visit (HOSPITAL_COMMUNITY)
Admission: RE | Admit: 2020-05-20 | Discharge: 2020-05-20 | Disposition: A | Discharge status: Home / Self Care | Payer: Medicare Other | Source: Ambulatory Visit | Attending: Family Medicine | Admitting: Family Medicine

## 2020-05-20 ENCOUNTER — Encounter: Payer: Self-pay | Admitting: Family Medicine

## 2020-05-20 ENCOUNTER — Ambulatory Visit (INDEPENDENT_AMBULATORY_CARE_PROVIDER_SITE_OTHER): Payer: Medicare Other | Admitting: Family Medicine

## 2020-05-20 ENCOUNTER — Other Ambulatory Visit: Payer: Self-pay

## 2020-05-20 ENCOUNTER — Telehealth: Payer: Self-pay | Admitting: Family Medicine

## 2020-05-20 VITALS — BP 102/64 | HR 78 | Temp 97.5°F | Ht 70.0 in | Wt 182.6 lb

## 2020-05-20 DIAGNOSIS — C3490 Malignant neoplasm of unspecified part of unspecified bronchus or lung: Secondary | ICD-10-CM | POA: Diagnosis not present

## 2020-05-20 DIAGNOSIS — C3411 Malignant neoplasm of upper lobe, right bronchus or lung: Secondary | ICD-10-CM | POA: Insufficient documentation

## 2020-05-20 DIAGNOSIS — R05 Cough: Secondary | ICD-10-CM | POA: Insufficient documentation

## 2020-05-20 DIAGNOSIS — Z85118 Personal history of other malignant neoplasm of bronchus and lung: Secondary | ICD-10-CM | POA: Diagnosis not present

## 2020-05-20 DIAGNOSIS — R053 Chronic cough: Secondary | ICD-10-CM

## 2020-05-20 DIAGNOSIS — J90 Pleural effusion, not elsewhere classified: Secondary | ICD-10-CM | POA: Diagnosis not present

## 2020-05-20 DIAGNOSIS — I2510 Atherosclerotic heart disease of native coronary artery without angina pectoris: Secondary | ICD-10-CM

## 2020-05-20 LAB — CBC WITH DIFFERENTIAL/PLATELET
Abs Immature Granulocytes: 0.03 10*3/uL (ref 0.00–0.07)
Basophils Absolute: 0 10*3/uL (ref 0.0–0.1)
Basophils Relative: 0 %
Eosinophils Absolute: 0 10*3/uL (ref 0.0–0.5)
Eosinophils Relative: 1 %
HCT: 37.4 % — ABNORMAL LOW (ref 39.0–52.0)
Hemoglobin: 11.5 g/dL — ABNORMAL LOW (ref 13.0–17.0)
Immature Granulocytes: 0 %
Lymphocytes Relative: 11 %
Lymphs Abs: 1 10*3/uL (ref 0.7–4.0)
MCH: 28.4 pg (ref 26.0–34.0)
MCHC: 30.7 g/dL (ref 30.0–36.0)
MCV: 92.3 fL (ref 80.0–100.0)
Monocytes Absolute: 0.7 10*3/uL (ref 0.1–1.0)
Monocytes Relative: 8 %
Neutro Abs: 7 10*3/uL (ref 1.7–7.7)
Neutrophils Relative %: 80 %
Platelets: 140 10*3/uL — ABNORMAL LOW (ref 150–400)
RBC: 4.05 MIL/uL — ABNORMAL LOW (ref 4.22–5.81)
RDW: 15 % (ref 11.5–15.5)
WBC: 8.7 10*3/uL (ref 4.0–10.5)
nRBC: 0 % (ref 0.0–0.2)

## 2020-05-20 LAB — COMPREHENSIVE METABOLIC PANEL
ALT: 14 U/L (ref 0–44)
AST: 18 U/L (ref 15–41)
Albumin: 3.6 g/dL (ref 3.5–5.0)
Alkaline Phosphatase: 71 U/L (ref 38–126)
Anion gap: 9 (ref 5–15)
BUN: 30 mg/dL — ABNORMAL HIGH (ref 8–23)
CO2: 28 mmol/L (ref 22–32)
Calcium: 8.9 mg/dL (ref 8.9–10.3)
Chloride: 98 mmol/L (ref 98–111)
Creatinine, Ser: 1.72 mg/dL — ABNORMAL HIGH (ref 0.61–1.24)
GFR calc Af Amer: 43 mL/min — ABNORMAL LOW (ref >60)
GFR calc non Af Amer: 37 mL/min — ABNORMAL LOW (ref >60)
Glucose, Bld: 105 mg/dL — ABNORMAL HIGH (ref 70–99)
Potassium: 4.4 mmol/L (ref 3.5–5.1)
Sodium: 135 mmol/L (ref 135–145)
Total Bilirubin: 0.5 mg/dL (ref 0.3–1.2)
Total Protein: 7.3 g/dL (ref 6.5–8.1)

## 2020-05-20 MED ORDER — PREDNISONE 10 MG PO TABS: ORAL_TABLET | ORAL | 0 refills | Status: DC

## 2020-05-20 NOTE — Progress Notes (Signed)
Patient ID: Paul Sandoval., male    DOB: [birth year], 79 y.o.   MRN: [id number]   Chief Complaint  Patient presents with   Fatigue   Subjective:    HPI   Pt here for weakness. Pt has been dealing with yellow-green mucus for a "while."  Coughing and spitting out mucus. Pt was on Doxycycline from lung specialist. Given on 05/09/20, and given 175m bid for 7 days, finished it on 9/20. No n/v/d; no headaches.  Pt has multiple health issues.  Doing his inhalers and neb, but not taking mucinex consistently.  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 Also CKD stage 3 and ischemic cardiomyopathy, EF 30%.  Coughing and hacking with nebulizer and mucinex.  And clear mucous.  occ with yellowish green amount. occ a large glob of sputum. 25% of time. Lung specailst- doxy. Finished it. 20th finsihed it. Still seems same amount.  No fever. No change in pressures.   Seeing oncologist- for h/o lung cancer. Feeling weak and in legs. Not taking mucinex. Using robitussin dm.  8050mmucinex each time, recommended by pulmonologist.  06/09/20- labs for cancer doctor. CT scan- lungs. Has lung cancer in rt upper lung. Treated with chemo and 33 days of radiation.  Been a long time since been on steroids. Not taking the mucinex consistently. At night time with going to bed and feeling not best breathing and then coughing up a big sputum and feeling better. Pt frequently has mucous production that is occasionally yellow and was told to take mucinex for this per pulmonologist notes. Pt not taking it as directed, rarely taking it.  Medical History [facility] a past medical history of AAA (abdominal aortic aneurysm) ([facility] [place](2010), Anemia, Arteriosclerotic cardiovascular disease (ASCVD) (1996), Arthritis, CAD (coronary artery disease), Cancer ([facility] [place] Cardiomyopathy, ischemic, Chronic  bronchitis ([place] Chronic kidney disease, Chronic rhinitis, Colonic polyp (2002), COPD (chronic obstructive pulmonary disease) ([facility] Diverticulosis, Dyspnea, ED (erectile dysfunction), Encounter for antineoplastic chemotherapy (12/19/2015), GERD (gastroesophageal reflux disease), History of blood transfusion, Hyperlipidemia, Hypertension, IFG (impaired fasting glucose), Myocardial infarction ([facility] Pneumonia (~ 2001; ~ 2005), Right bundle branch block, and Tobacco abuse, in remission.   Outpatient Encounter Medications as of 05/20/2020  Medication Sig   acetaminophen (TYLENOL) 500 MG tablet Take 500 mg by mouth every 6 (six) hours as needed for mild pain.   albuterol (PROAIR HFA) 108 (90 Base) MCG/ACT inhaler INHALE 2 PUFFS BY MOUTH EVERY 4 TO 6 HOURS AS NEEDED FOR WHEEZING.   aspirin EC 81 MG tablet Take 81 mg by mouth daily before breakfast.    carvedilol (COREG) 12.5 MG tablet Take 1.5 tablets (18.75 mg total) by mouth 2 (two) times daily with a meal.   clopidogrel (PLAVIX) 75 MG tablet TAKE (1) TABLET BY MOUTH ONCE DAILY.   dapagliflozin propanediol (FARXIGA) 10 MG TABS tablet Take 10 mg by mouth daily before breakfast.   eplerenone (INSPRA) 50 MG tablet Take 1 tablet (50 mg total) by mouth every evening.   furosemide (LASIX) 40 MG tablet Take 1 tablet (40 mg total) by mouth daily.   GuaiFENesin (MUCUS RELIEF ADULT PO) Take 2 capsules by mouth as needed.    ipratropium-albuterol (DUONEB) 0.5-2.5 (3) MG/3ML SOLN INHALE 1 VIAL VIA NEBULIZER 4 TIMES DAILY.   Multiple Vitamins-Minerals (CENTRUM SILVER ADULT 50+) TABS Take 1 tablet by mouth daily with lunch.  nitroGLYCERIN (NITROSTAT) 0.4 MG SL tablet PLACE 1 TAB UNDER TONGUE EVERY 5 MIN IF NEEDED FOR CHEST PAIN. MAY USE 3 TIMES.NO RELIEF CALL 911.   omeprazole (PRILOSEC) 20 MG capsule TAKE ONE CAPSULE BY MOUTH DAILY.   sacubitril-valsartan (ENTRESTO) 49-51 MG TAKE (1) TABLET BY MOUTH TWICE DAILY.   budesonide-formoterol (SYMBICORT)  160-4.5 MCG/ACT inhaler Inhale 2 puffs into the lungs 2 (two) times daily.   rosuvastatin (CRESTOR) 20 MG tablet Take 1 tablet (20 mg total) by mouth daily.   [DISCONTINUED] doxycycline (VIBRA-TABS) 100 MG tablet Take 1 tablet (100 mg total) by mouth 2 (two) times daily.   No facility-administered encounter medications on file as of 05/20/2020.     Review of Systems  Constitutional: Positive for fatigue. Negative for chills and fever.  HENT: Negative for congestion, rhinorrhea and sore throat.   Respiratory: Positive for cough and stridor. Negative for chest tightness, shortness of breath and wheezing.   Cardiovascular: Negative for chest pain and leg swelling.  Gastrointestinal: Negative for abdominal pain, diarrhea, nausea and vomiting.  Genitourinary: Negative for dysuria and frequency.  Skin: Negative for rash.  Neurological: Positive for weakness. Negative for dizziness and headaches.     Vitals BP 102/64    Pulse 78    Temp (!) 97.5 F (36.4 C)    Ht '5\' 10"'  (1.778 m)    Wt 182 lb 9.6 oz (82.8 kg)    SpO2 99%    BMI 26.20 kg/m   Objective:   Physical Exam Vitals and nursing note reviewed.  Constitutional:      General: He is not in acute distress.    Appearance: Normal appearance. He is not ill-appearing.  HENT:     Head: Normocephalic.     Nose: Nose normal. No congestion.     Mouth/Throat:     Mouth: Mucous membranes are moist.     Pharynx: No oropharyngeal exudate.  Eyes:     Extraocular Movements: Extraocular movements intact.     Conjunctiva/sclera: Conjunctivae normal.     Pupils: Pupils are equal, round, and reactive to light.  Cardiovascular:     Rate and Rhythm: Normal rate and regular rhythm.     Pulses: Normal pulses.     Heart sounds: Normal heart sounds. No murmur heard.   Pulmonary:     Effort: Pulmonary effort is normal. No respiratory distress.     Breath sounds: Wheezing (right posterior base and middle lung) present. No rhonchi or rales.    Musculoskeletal:        General: Normal range of motion.     Right lower leg: No edema.     Left lower leg: No edema.  Skin:    General: Skin is warm and dry.     Findings: No rash.  Neurological:     General: No focal deficit present.     Mental Status: He is alert and oriented to person, place, and time.     Cranial Nerves: No cranial nerve deficit.     Motor: No weakness.     Gait: Gait normal.  Psychiatric:        Mood and Affect: Mood normal.        Behavior: Behavior normal.        Thought Content: Thought content normal.        Judgment: Judgment normal.      Assessment and Plan   1. Persistent cough - DG Chest 2 View; Future - CMP14+EGFR - CBC With Differential - Comprehensive  Metabolic Panel (CMET) - CBC with Differential  2. History of lung cancer - DG Chest 2 View; Future - Comprehensive Metabolic Panel (CMET) - CBC with Differential  3. Coronary artery disease involving native coronary artery of native heart without angina pectoris - Lipid panel; Future - Comprehensive Metabolic Panel (CMET) - CBC with Differential - Lipid panel   Pt had both covid vaccines.  Recommending pt get the booster.  Cont inhalers- symbicort, albuterol and neb treatments. Will call with xray and lab results.  mucinex or robitussin prn for mucous production per pulmonologist.  Pt frequently has mucous production that is occasionally yellow and was told to take mucinex for this. Pt not taking it as directed.   Recommended pt get the covid booster when available. F/u with pulm and oncology as scheduled.   F/u prn.  Will call pt with labs and xray results.

## 2020-05-23 ENCOUNTER — Telehealth: Payer: Self-pay | Admitting: Pulmonary Disease

## 2020-05-23 DIAGNOSIS — D696 Thrombocytopenia, unspecified: Secondary | ICD-10-CM | POA: Diagnosis not present

## 2020-05-23 DIAGNOSIS — R05 Cough: Secondary | ICD-10-CM | POA: Diagnosis not present

## 2020-05-23 DIAGNOSIS — I10 Essential (primary) hypertension: Secondary | ICD-10-CM | POA: Diagnosis not present

## 2020-05-23 DIAGNOSIS — E785 Hyperlipidemia, unspecified: Secondary | ICD-10-CM | POA: Diagnosis not present

## 2020-05-24 LAB — CBC WITH DIFFERENTIAL
Basophils Absolute: 0 10*3/uL (ref 0.0–0.2)
Basos: 0 %
EOS (ABSOLUTE): 0 10*3/uL (ref 0.0–0.4)
Eos: 0 %
Hematocrit: 37 % — ABNORMAL LOW (ref 37.5–51.0)
Hemoglobin: 11.9 g/dL — ABNORMAL LOW (ref 13.0–17.7)
Immature Grans (Abs): 0.1 10*3/uL (ref 0.0–0.1)
Immature Granulocytes: 1 %
Lymphocytes Absolute: 1.1 10*3/uL (ref 0.7–3.1)
Lymphs: 8 %
MCH: 28.5 pg (ref 26.6–33.0)
MCHC: 32.2 g/dL (ref 31.5–35.7)
MCV: 89 fL (ref 79–97)
Monocytes Absolute: 0.9 10*3/uL (ref 0.1–0.9)
Monocytes: 7 %
Neutrophils Absolute: 11.2 10*3/uL — ABNORMAL HIGH (ref 1.4–7.0)
Neutrophils: 84 %
RBC: 4.17 x10E6/uL (ref 4.14–5.80)
RDW: 14.8 % (ref 11.6–15.4)
WBC: 13.3 10*3/uL — ABNORMAL HIGH (ref 3.4–10.8)

## 2020-05-24 LAB — CMP14+EGFR
ALT: 12 IU/L (ref 0–44)
AST: 16 IU/L (ref 0–40)
Albumin/Globulin Ratio: 1.5 (ref 1.2–2.2)
Albumin: 4.1 g/dL (ref 3.7–4.7)
Alkaline Phosphatase: 81 IU/L (ref 44–121)
BUN/Creatinine Ratio: 16 (ref 10–24)
BUN: 28 mg/dL — ABNORMAL HIGH (ref 8–27)
Bilirubin Total: 0.2 mg/dL (ref 0.0–1.2)
CO2: 25 mmol/L (ref 20–29)
Calcium: 9.3 mg/dL (ref 8.6–10.2)
Chloride: 102 mmol/L (ref 96–106)
Creatinine, Ser: 1.73 mg/dL — ABNORMAL HIGH (ref 0.76–1.27)
GFR calc Af Amer: 42 mL/min/{1.73_m2} — ABNORMAL LOW (ref >59)
GFR calc non Af Amer: 36 mL/min/{1.73_m2} — ABNORMAL LOW (ref >59)
Globulin, Total: 2.8 g/dL (ref 1.5–4.5)
Glucose: 86 mg/dL (ref 65–99)
Potassium: 4.4 mmol/L (ref 3.5–5.2)
Sodium: 140 mmol/L (ref 134–144)
Total Protein: 6.9 g/dL (ref 6.0–8.5)

## 2020-05-24 LAB — LIPID PANEL
Chol/HDL Ratio: 2.9 ratio (ref 0.0–5.0)
Cholesterol, Total: 119 mg/dL (ref 100–199)
HDL: 41 mg/dL (ref >39)
LDL Chol Calc (NIH): 59 mg/dL (ref 0–99)
Triglycerides: 104 mg/dL (ref 0–149)
VLDL Cholesterol Cal: 19 mg/dL (ref 5–40)

## 2020-05-24 NOTE — Telephone Encounter (Signed)
Primary Pulmonologist: Dr.Alva Last office visit and with whom: 04/21/20 Dr.Alva What do we see them for (pulmonary problems):COPD  Last OV assessment/plan:  Assessment & Plan Note by Rigoberto Noel, MD at 04/21/2020 10:22 AM Author: Rigoberto Noel, MD Author Type: Physician Filed: 04/21/2020 10:22 AM  Note Status: Written Cosign: Cosign Not Required Encounter Date: 04/21/2020  Problem: [facility]  Editor: Rigoberto Noel, MD (Physician)               No evidence of recurrence, last CT scan was reviewed. Loculated right effusion persists post VATS procedure    Patient Instructions by Rigoberto Noel, MD at 04/21/2020 10:00 AM Author: Rigoberto Noel, MD Author Type: Physician Filed: 04/21/2020 10:16 AM  Note Status: Signed Cosign: Cosign Not Required Encounter Date: 04/21/2020  Editor: Rigoberto Noel, MD (Physician)               Continue symbicort Use mucinex for excess mucus production CT scan was stable     Instructions    Return in about 6 months (around 10/22/2020) for BM. Continue symbicort Use mucinex for excess mucus production CT scan was stable         After Visit Summary (Printed 04/21/2020) Communications    [facility] Provider CC Chart Rep sent to [place], DO Media From this encounter Electronic signature on 04/21/2020 9:50 AM - 1 of 3 e-signatures recorded  Communication Routing History  Recipient Method Sent by Date Sent  Malena Geni Bers, DO In Dorothyann Gibbs, MD 04/21/2020     No questionnaires available.           Orders Placed   None Medication Changes    None   Medication List  Visit Diagnoses    Chronic obstructive airway disease with asthma ([place])   Primary cancer of right upper lobe of lung ([place])   Problem List  Level of Service  Level of Service  PR OFFICE/OUTPATIENT ESTABLISHED LOW MDM 20-29 MIN [63016]  All Charges for This Encounter  Code Description Service Date Service Provider  Modifiers Qty  99213 PR OFFICE/OUTPATIENT ESTABLISHED LOW MDM 20-29 MIN 04/21/2020        Was appointment offered to patient (explain)?    Reason for call: Spoke with patient regarding prior message.Patient stated the [facility] will not be distributing out Symbicort anymore.Pateint was advised to take Wixela or Fluticasone-salmeterol. Pateint was wondering if is ok and safe to switch to these medication's ? Are they similar to Symbicort ?Patient only has a few days to contact [place] regarding prior message.   Dr.Alva can you please advise  Thank you     Allergies  Allergen Reactions  . Neomycin Hives    Immunization History  Administered Date(s) Administered  . Fluad Quad(high Dose 65+) 05/19/2019  . H1N1 08/06/2008  . Influenza Split 06/24/2013  . Influenza,inj,Quad PF,6+ Mos 06/15/2014, 05/31/2015, 05/09/2016, 06/06/2017, 06/10/2018  . Influenza-Unspecified 05/27/2012  . Moderna SARS-COVID-2 Vaccination 09/11/2019, 10/06/2019  . Pneumococcal Conjugate-13 08/18/2014  . Pneumococcal Polysaccharide-23 03/27/2012  . Zoster 07/27/2008

## 2020-05-24 NOTE — Telephone Encounter (Signed)
Patient calling back regarding medication change.  Wants to know if it is the same or any information regarding it.  Medication to be changed to is fluticasone/salmeterol/Wixela.  Please advise.  [phone].  Patient needs to know ASAP to ok prescription.

## 2020-05-24 NOTE — Telephone Encounter (Signed)
Ok to swtich to [employer] or Fluticasone-salmeterol They are comparable to symbicort - just different brand

## 2020-05-24 NOTE — Telephone Encounter (Signed)
Called and spoke with patient per Dr Elsworth Soho about ok to switch to Fluticasone-Salmeterol or Wixela. [facility] S[facility] to Fluticasone-Salmeterol and he wanted the ok from Dr Elsworth Soho. Patient expressed understanding that this change is ok per Dr Elsworth Soho. Nothing further needed at this time.

## 2020-05-27 DIAGNOSIS — J449 Chronic obstructive pulmonary disease, unspecified: Secondary | ICD-10-CM | POA: Diagnosis not present

## 2020-05-30 ENCOUNTER — Telehealth: Payer: Self-pay

## 2020-05-30 NOTE — Telephone Encounter (Signed)
Need date of last [employer] Shot and Flu Shot   Pt call back [phone]

## 2020-06-09 ENCOUNTER — Other Ambulatory Visit: Payer: Self-pay

## 2020-06-09 ENCOUNTER — Ambulatory Visit (HOSPITAL_COMMUNITY)
Admission: RE | Admit: 2020-06-09 | Discharge: 2020-06-09 | Disposition: A | Discharge status: Home / Self Care | Payer: Medicare Other | Source: Ambulatory Visit | Attending: Internal Medicine | Admitting: Internal Medicine

## 2020-06-09 ENCOUNTER — Inpatient Hospital Stay: Payer: Medicare Other | Attending: Internal Medicine

## 2020-06-09 DIAGNOSIS — Z955 Presence of coronary angioplasty implant and graft: Secondary | ICD-10-CM | POA: Diagnosis not present

## 2020-06-09 DIAGNOSIS — Z8719 Personal history of other diseases of the digestive system: Secondary | ICD-10-CM | POA: Insufficient documentation

## 2020-06-09 DIAGNOSIS — C3490 Malignant neoplasm of unspecified part of unspecified bronchus or lung: Secondary | ICD-10-CM | POA: Diagnosis not present

## 2020-06-09 DIAGNOSIS — I129 Hypertensive chronic kidney disease with stage 1 through stage 4 chronic kidney disease, or unspecified chronic kidney disease: Secondary | ICD-10-CM | POA: Insufficient documentation

## 2020-06-09 DIAGNOSIS — Z79899 Other long term (current) drug therapy: Secondary | ICD-10-CM | POA: Diagnosis not present

## 2020-06-09 DIAGNOSIS — N183 Chronic kidney disease, stage 3 unspecified: Secondary | ICD-10-CM | POA: Insufficient documentation

## 2020-06-09 DIAGNOSIS — K219 Gastro-esophageal reflux disease without esophagitis: Secondary | ICD-10-CM | POA: Insufficient documentation

## 2020-06-09 DIAGNOSIS — C3411 Malignant neoplasm of upper lobe, right bronchus or lung: Secondary | ICD-10-CM | POA: Diagnosis not present

## 2020-06-09 DIAGNOSIS — M1990 Unspecified osteoarthritis, unspecified site: Secondary | ICD-10-CM | POA: Insufficient documentation

## 2020-06-09 DIAGNOSIS — R0609 Other forms of dyspnea: Secondary | ICD-10-CM | POA: Insufficient documentation

## 2020-06-09 DIAGNOSIS — J449 Chronic obstructive pulmonary disease, unspecified: Secondary | ICD-10-CM | POA: Insufficient documentation

## 2020-06-09 DIAGNOSIS — Z9049 Acquired absence of other specified parts of digestive tract: Secondary | ICD-10-CM | POA: Insufficient documentation

## 2020-06-09 DIAGNOSIS — I700 Atherosclerosis of aorta: Secondary | ICD-10-CM | POA: Insufficient documentation

## 2020-06-09 DIAGNOSIS — I255 Ischemic cardiomyopathy: Secondary | ICD-10-CM | POA: Insufficient documentation

## 2020-06-09 DIAGNOSIS — R590 Localized enlarged lymph nodes: Secondary | ICD-10-CM | POA: Insufficient documentation

## 2020-06-09 DIAGNOSIS — J8410 Pulmonary fibrosis, unspecified: Secondary | ICD-10-CM | POA: Diagnosis not present

## 2020-06-09 DIAGNOSIS — I2510 Atherosclerotic heart disease of native coronary artery without angina pectoris: Secondary | ICD-10-CM | POA: Insufficient documentation

## 2020-06-09 DIAGNOSIS — J90 Pleural effusion, not elsewhere classified: Secondary | ICD-10-CM | POA: Diagnosis not present

## 2020-06-09 DIAGNOSIS — Z923 Personal history of irradiation: Secondary | ICD-10-CM | POA: Diagnosis not present

## 2020-06-09 DIAGNOSIS — Z7951 Long term (current) use of inhaled steroids: Secondary | ICD-10-CM | POA: Diagnosis not present

## 2020-06-09 DIAGNOSIS — I252 Old myocardial infarction: Secondary | ICD-10-CM | POA: Diagnosis not present

## 2020-06-09 LAB — CBC WITH DIFFERENTIAL (CANCER CENTER ONLY)
Abs Immature Granulocytes: 0.03 10*3/uL (ref 0.00–0.07)
Basophils Absolute: 0 10*3/uL (ref 0.0–0.1)
Basophils Relative: 0 %
Eosinophils Absolute: 0.1 10*3/uL (ref 0.0–0.5)
Eosinophils Relative: 1 %
HCT: 35.6 % — ABNORMAL LOW (ref 39.0–52.0)
Hemoglobin: 11.4 g/dL — ABNORMAL LOW (ref 13.0–17.0)
Immature Granulocytes: 0 %
Lymphocytes Relative: 8 %
Lymphs Abs: 0.8 10*3/uL (ref 0.7–4.0)
MCH: 28.2 pg (ref 26.0–34.0)
MCHC: 32 g/dL (ref 30.0–36.0)
MCV: 88.1 fL (ref 80.0–100.0)
Monocytes Absolute: 0.8 10*3/uL (ref 0.1–1.0)
Monocytes Relative: 8 %
Neutro Abs: 8.2 10*3/uL — ABNORMAL HIGH (ref 1.7–7.7)
Neutrophils Relative %: 83 %
Platelet Count: 109 10*3/uL — ABNORMAL LOW (ref 150–400)
RBC: 4.04 MIL/uL — ABNORMAL LOW (ref 4.22–5.81)
RDW: 15 % (ref 11.5–15.5)
WBC Count: 9.9 10*3/uL (ref 4.0–10.5)
nRBC: 0 % (ref 0.0–0.2)

## 2020-06-09 LAB — CMP (CANCER CENTER ONLY)
ALT: 8 U/L (ref 0–44)
AST: 14 U/L — ABNORMAL LOW (ref 15–41)
Albumin: 3.4 g/dL — ABNORMAL LOW (ref 3.5–5.0)
Alkaline Phosphatase: 65 U/L (ref 38–126)
Anion gap: 6 (ref 5–15)
BUN: 21 mg/dL (ref 8–23)
CO2: 31 mmol/L (ref 22–32)
Calcium: 9.3 mg/dL (ref 8.9–10.3)
Chloride: 102 mmol/L (ref 98–111)
Creatinine: 1.88 mg/dL — ABNORMAL HIGH (ref 0.61–1.24)
GFR, Estimated: 33 mL/min — ABNORMAL LOW (ref >60)
Glucose, Bld: 96 mg/dL (ref 70–99)
Potassium: 4.1 mmol/L (ref 3.5–5.1)
Sodium: 139 mmol/L (ref 135–145)
Total Bilirubin: 0.8 mg/dL (ref 0.3–1.2)
Total Protein: 7.1 g/dL (ref 6.5–8.1)

## 2020-06-10 ENCOUNTER — Other Ambulatory Visit: Payer: Medicare Other

## 2020-06-13 ENCOUNTER — Encounter: Payer: Self-pay | Admitting: Internal Medicine

## 2020-06-13 ENCOUNTER — Inpatient Hospital Stay (HOSPITAL_BASED_OUTPATIENT_CLINIC_OR_DEPARTMENT_OTHER): Payer: Medicare Other | Admitting: Internal Medicine

## 2020-06-13 ENCOUNTER — Other Ambulatory Visit: Payer: Self-pay

## 2020-06-13 ENCOUNTER — Other Ambulatory Visit: Payer: Medicare Other

## 2020-06-13 VITALS — BP 110/50 | HR 82 | Temp 97.0°F | Resp 17 | Ht 70.0 in | Wt 182.6 lb

## 2020-06-13 DIAGNOSIS — J90 Pleural effusion, not elsewhere classified: Secondary | ICD-10-CM | POA: Diagnosis not present

## 2020-06-13 DIAGNOSIS — Z7951 Long term (current) use of inhaled steroids: Secondary | ICD-10-CM | POA: Diagnosis not present

## 2020-06-13 DIAGNOSIS — C3411 Malignant neoplasm of upper lobe, right bronchus or lung: Secondary | ICD-10-CM

## 2020-06-13 DIAGNOSIS — I252 Old myocardial infarction: Secondary | ICD-10-CM | POA: Diagnosis not present

## 2020-06-13 DIAGNOSIS — C3490 Malignant neoplasm of unspecified part of unspecified bronchus or lung: Secondary | ICD-10-CM

## 2020-06-13 DIAGNOSIS — I129 Hypertensive chronic kidney disease with stage 1 through stage 4 chronic kidney disease, or unspecified chronic kidney disease: Secondary | ICD-10-CM | POA: Diagnosis not present

## 2020-06-13 DIAGNOSIS — J449 Chronic obstructive pulmonary disease, unspecified: Secondary | ICD-10-CM | POA: Diagnosis not present

## 2020-06-13 DIAGNOSIS — Z9049 Acquired absence of other specified parts of digestive tract: Secondary | ICD-10-CM | POA: Diagnosis not present

## 2020-06-13 DIAGNOSIS — Z923 Personal history of irradiation: Secondary | ICD-10-CM | POA: Diagnosis not present

## 2020-06-13 DIAGNOSIS — K219 Gastro-esophageal reflux disease without esophagitis: Secondary | ICD-10-CM | POA: Diagnosis not present

## 2020-06-13 DIAGNOSIS — N183 Chronic kidney disease, stage 3 unspecified: Secondary | ICD-10-CM | POA: Diagnosis not present

## 2020-06-13 DIAGNOSIS — Z79899 Other long term (current) drug therapy: Secondary | ICD-10-CM | POA: Diagnosis not present

## 2020-06-13 DIAGNOSIS — Z8719 Personal history of other diseases of the digestive system: Secondary | ICD-10-CM | POA: Diagnosis not present

## 2020-06-13 DIAGNOSIS — I2510 Atherosclerotic heart disease of native coronary artery without angina pectoris: Secondary | ICD-10-CM | POA: Diagnosis not present

## 2020-06-13 DIAGNOSIS — I700 Atherosclerosis of aorta: Secondary | ICD-10-CM | POA: Diagnosis not present

## 2020-06-13 DIAGNOSIS — M1990 Unspecified osteoarthritis, unspecified site: Secondary | ICD-10-CM | POA: Diagnosis not present

## 2020-06-13 DIAGNOSIS — R0609 Other forms of dyspnea: Secondary | ICD-10-CM | POA: Diagnosis not present

## 2020-06-13 DIAGNOSIS — I255 Ischemic cardiomyopathy: Secondary | ICD-10-CM | POA: Diagnosis not present

## 2020-06-13 DIAGNOSIS — I10 Essential (primary) hypertension: Secondary | ICD-10-CM | POA: Diagnosis not present

## 2020-06-13 DIAGNOSIS — R590 Localized enlarged lymph nodes: Secondary | ICD-10-CM | POA: Diagnosis not present

## 2020-06-13 NOTE — Progress Notes (Signed)
[place] [phone]   [phone]  OFFICE PROGRESS NOTE  Erven Colla, DO [place] [address]  DIAGNOSIS: Stage IIIA (T2b, N2, M0) non-small cell lung cancer, favoring squamous cell carcinoma presented with right upper lobe lung mass in addition to mediastinal lymphadenopathy diagnosed in March 2017.  PRIOR THERAPY:  A course of concurrent chemoradiation with weekly carboplatin for AUC of 2 and paclitaxel 45 MG/M2. Status post 5 cycle with partial response.  CURRENT THERAPY: Observation  INTERVAL HISTORY: Paul Sandoval. 79 y.o. male returns to the clinic today for 6 months follow-up visit.  The patient is feeling fine today with no concerning complaints except for recent bronchitis treated with a course of antibiotics and steroid.  He denied having any current chest pain but has shortness of breath with exertion with mild cough and no hemoptysis.  He denied having any weight loss or night sweats.  He has no nausea, vomiting, diarrhea or constipation.  He denied having any significant weight loss or night sweats.  He had repeat CT scan of the chest performed recently and is here for evaluation and discussion of his discuss results.  MEDICAL HISTORY: Past Medical History:  Diagnosis Date  . AAA (abdominal aortic aneurysm) ([place]) 2010   4.4 cm 08/2008;4.44 in 7/10 and 4.65 in 08/2009; 4.8 by CT in 11/2009; 4.3 by ultrasound in 08/2010  . Anemia   . Arteriosclerotic cardiovascular disease (ASCVD) 1996   CABG-1996  . Arthritis    "fingers" (03/18/2014)  . CAD (coronary artery disease)    03/18/14:  PCI with DES to distal left main. 7/29: DES to the SVG to Diag  . Cancer ([place])    Upper right lobe lung cancer  . Cardiomyopathy, ischemic    Echo 03/17/14: EF 45-50%  . Chronic bronchitis ([place])   . Chronic kidney disease    CRF  . Chronic rhinitis   . Colonic polyp 2002   polypectomy in 2002  . COPD (chronic obstructive  pulmonary disease) ([place])   . Diverticulosis   . Dyspnea    with exertion  . ED (erectile dysfunction)   . Encounter for antineoplastic chemotherapy 12/19/2015  . GERD (gastroesophageal reflux disease)   . History of blood transfusion   . Hyperlipidemia   . Hypertension   . IFG (impaired fasting glucose)   . Myocardial infarction [facility])    "told h/o silent MI sometime before 1996"  . Pneumonia ~ 2001; ~ 2005    has had more than twice  . Right bundle branch block   . Tobacco abuse, in remission    40 pack year total consumption; discontinued in 1996    ALLERGIES:  is allergic to neomycin.  MEDICATIONS:  Current Outpatient Medications  Medication Sig Dispense Refill  . acetaminophen (TYLENOL) 500 MG tablet Take 500 mg by mouth every 6 (six) hours as needed for mild pain.    Marland Kitchen albuterol (PROAIR HFA) 108 (90 Base) MCG/ACT inhaler INHALE 2 PUFFS BY MOUTH EVERY 4 TO 6 HOURS AS NEEDED FOR WHEEZING. 8.5 g 0  . aspirin EC 81 MG tablet Take 81 mg by mouth daily before breakfast.     . budesonide-formoterol (SYMBICORT) 160-4.5 MCG/ACT inhaler Inhale 2 puffs into the lungs 2 (two) times daily. 3 Inhaler 3  . carvedilol (COREG) 12.5 MG tablet Take 1.5 tablets (18.75 mg total) by mouth 2 (two) times daily with a meal. 270 tablet 3  . clopidogrel (  PLAVIX) 75 MG tablet TAKE (1) TABLET BY MOUTH ONCE DAILY. 90 tablet 3  . dapagliflozin propanediol (FARXIGA) 10 MG TABS tablet Take 10 mg by mouth daily before breakfast. 30 tablet 11  . eplerenone (INSPRA) 50 MG tablet Take 1 tablet (50 mg total) by mouth every evening. 30 tablet 11  . furosemide (LASIX) 40 MG tablet Take 1 tablet (40 mg total) by mouth daily. 90 tablet 3  . GuaiFENesin (MUCUS RELIEF ADULT PO) Take 2 capsules by mouth as needed.     Marland Kitchen ipratropium-albuterol (DUONEB) 0.5-2.5 (3) MG/3ML SOLN INHALE 1 VIAL VIA NEBULIZER 4 TIMES DAILY. 360 mL 3  . Multiple Vitamins-Minerals (CENTRUM SILVER ADULT 50+) TABS Take 1 tablet by mouth daily with  lunch.     . nitroGLYCERIN (NITROSTAT) 0.4 MG SL tablet PLACE 1 TAB UNDER TONGUE EVERY 5 MIN IF NEEDED FOR CHEST PAIN. MAY USE 3 TIMES.NO RELIEF CALL 911. 25 tablet 1  . omeprazole (PRILOSEC) 20 MG capsule TAKE ONE CAPSULE BY MOUTH DAILY. 90 capsule 3  . predniSONE (DELTASONE) 10 MG tablet Take 3 tab p.o. 2 days,then 2 tab p.o. x 2 days, then 1 tab p.o. x 2 days. 12 tablet 0  . rosuvastatin (CRESTOR) 20 MG tablet Take 1 tablet (20 mg total) by mouth daily. 90 tablet 3  . sacubitril-valsartan (ENTRESTO) 49-51 MG TAKE (1) TABLET BY MOUTH TWICE DAILY. 60 tablet 11   No current facility-administered medications for this visit.    SURGICAL HISTORY:  Past Surgical History:  Procedure Laterality Date  . ABDOMINAL AORTIC ANEURYSM REPAIR  11/2012  . ABDOMINAL AORTIC ENDOVASCULAR STENT GRAFT N/A 12/11/2012   Procedure: ABDOMINAL AORTIC ENDOVASCULAR STENT GRAFT;  Surgeon: Mal Misty, MD;  Location: [facility];  Service: Vascular;  Laterality: N/A;  Ultrasound guided; [place]  . CARDIAC CATHETERIZATION  01/08/1995  . COLONOSCOPY  2002   polypectomy-patient denies  . CORONARY ANGIOPLASTY WITH STENT PLACEMENT  03/18/2014   "1"  . CORONARY ANGIOPLASTY WITH STENT PLACEMENT  03/24/2014   "1"  . CORONARY ARTERY BYPASS GRAFT  01/09/1995   "CABG X3"  . FLEXIBLE BRONCHOSCOPY N/A 03/01/2017   Procedure: FLEXIBLE BRONCHOSCOPY WITH BIOPSIES;  Surgeon: Gaye Pollack, MD;  Location: [facility] OR;  Service: Thoracic;  Laterality: N/A;  . JOINT REPLACEMENT    . LAPAROSCOPIC CHOLECYSTECTOMY  12/2009  . LEFT AND RIGHT HEART CATHETERIZATION WITH CORONARY/GRAFT ANGIOGRAM N/A 03/18/2014   Procedure: LEFT AND RIGHT HEART CATHETERIZATION WITH Beatrix Fetters;  Surgeon: Blane Ohara, MD;  Location: [facility] CATH LAB;  Service: Cardiovascular;  Laterality: N/A;  . PERCUTANEOUS CORONARY STENT INTERVENTION (PCI-S)  03/18/2014   Procedure: PERCUTANEOUS CORONARY STENT INTERVENTION (PCI-S);  Surgeon: Blane Ohara, MD;  Location: [facility]  CATH LAB;  Service: Cardiovascular;;  . PERCUTANEOUS CORONARY STENT INTERVENTION (PCI-S) N/A 03/24/2014   Procedure: PERCUTANEOUS CORONARY STENT INTERVENTION (PCI-S);  Surgeon: Blane Ohara, MD;  Location: [facility] CATH LAB;  Service: Cardiovascular;  Laterality: N/A;  . PLEURAL EFFUSION DRAINAGE Right 03/01/2017   Procedure: DRAINAGE OF PLEURAL EFFUSION;  Surgeon: Gaye Pollack, MD;  Location: [place];  Service: Thoracic;  Laterality: Right;  . RIGHT/LEFT HEART CATH AND CORONARY/GRAFT ANGIOGRAPHY N/A 05/15/2017   Procedure: RIGHT/LEFT HEART CATH AND CORONARY/GRAFT ANGIOGRAPHY;  Surgeon: Larey Dresser, MD;  Location: [place] CV LAB;  Service: Cardiovascular;  Laterality: N/A;  . TALC PLEURODESIS Right 03/01/2017   Procedure: Pietro Cassis;  Surgeon: Gaye Pollack, MD;  Location: [place];  Service: Thoracic;  Laterality: Right;  .  TOTAL HIP ARTHROPLASTY Left 01/21/2013   Procedure: TOTAL HIP ARTHROPLASTY ANTERIOR APPROACH;  Surgeon: Mauri Pole, MD;  Location: [place];  Service: Orthopedics;  Laterality: Left;  Marland Kitchen VIDEO ASSISTED THORACOSCOPY Right 03/01/2017   Procedure: VIDEO ASSISTED THORACOSCOPY WITH BIOPSIES;  Surgeon: Gaye Pollack, MD;  Location: [place];  Service: Thoracic;  Laterality: Right;  Marland Kitchen VIDEO BRONCHOSCOPY N/A 11/17/2015   Procedure: VIDEO BRONCHOSCOPY WITH FLUORO;  Surgeon: Rigoberto Noel, MD;  Location: [place];  Service: Cardiopulmonary;  Laterality: N/A;    REVIEW OF SYSTEMS:  A comprehensive review of systems was negative except for: Respiratory: positive for cough and dyspnea on exertion   PHYSICAL EXAMINATION: General appearance: alert, cooperative and no distress Head: Normocephalic, without obvious abnormality, atraumatic Neck: no adenopathy, no JVD, supple, symmetrical, trachea midline and thyroid not enlarged, symmetric, no tenderness/mass/nodules Lymph nodes: Cervical, supraclavicular, and axillary nodes normal. Resp: clear to auscultation bilaterally Back:  symmetric, no curvature. ROM normal. No CVA tenderness. Cardio: regular rate and rhythm, S1, S2 normal, no murmur, click, rub or gallop GI: soft, non-tender; bowel sounds normal; no masses,  no organomegaly Extremities: extremities normal, atraumatic, no cyanosis or edema  ECOG PERFORMANCE STATUS: 1 - Symptomatic but completely ambulatory  Blood pressure (!) 110/50, pulse 82, temperature (!) 97 F (36.1 C), temperature source Tympanic, resp. rate 17, height _0  (1.778 m), weight 182 lb 9.6 oz (82.8 kg), SpO2 95 %.  LABORATORY DATA: Lab Results  Component Value Date   WBC 9.9 06/09/2020   HGB 11.4 (L) 06/09/2020   HCT 35.6 (L) 06/09/2020   MCV 88.1 06/09/2020   PLT 109 (L) 06/09/2020      Chemistry      Component Value Date/Time   NA 139 06/09/2020 0849   NA 140 05/23/2020 0821   NA 137 05/21/2017 0906   K 4.1 06/09/2020 0849   K 4.1 05/21/2017 0906   CL 102 06/09/2020 0849   CO2 31 06/09/2020 0849   CO2 29 05/21/2017 0906   BUN 21 06/09/2020 0849   BUN 28 (H) 05/23/2020 0821   BUN 14.6 05/21/2017 0906   CREATININE 1.88 (H) 06/09/2020 0849   CREATININE 1.2 05/21/2017 0906      Component Value Date/Time   CALCIUM 9.3 06/09/2020 0849   CALCIUM 9.4 05/21/2017 0906   ALKPHOS 65 06/09/2020 0849   ALKPHOS 86 05/21/2017 0906   AST 14 (L) 06/09/2020 0849   AST 19 05/21/2017 0906   ALT 8 06/09/2020 0849   ALT 14 05/21/2017 0906   BILITOT 0.8 06/09/2020 0849   BILITOT 0.57 05/21/2017 0906       RADIOGRAPHIC STUDIES: DG Chest 2 View  Result Date: 05/20/2020 CLINICAL DATA:  Cough.  History of lung cancer on the right EXAM: CHEST - 2 VIEW COMPARISON:  02/16/2020 FINDINGS: Stable post treatment changes on the right volume loss, dense right apical opacity, and costophrenic sulcus blunting. There is a chronic right pleural effusion and radiation fibrosis on a 12/14/2019 chest CT. The left lung is stable from prior. No acute airspace disease. Normal heart size and stable  mediastinal contours. CABG. IMPRESSION: Stable post treatment chest.  No visible pneumonia. Electronically Signed   By: Monte Fantasia M.D.   On: 05/20/2020 11:23   CT Chest Wo Contrast  Result Date: 06/09/2020 CLINICAL DATA:  Primary Cancer Type: Lung Imaging Indication: Routine surveillance Interval therapy since last imaging? No Initial Cancer Diagnosis Date: 10/2015; Established by: Presumed; Cytology was negative for malignancy. Detailed Pathology:  Stage IIIA non-small cell lung cancer, favoring squamous cell carcinoma. Primary Tumor location: Right upper lobe. Surgeries: AAA and coronary stents. Chemotherapy: Yes; Ongoing?  No; Most recent administration: 12/2015 Immunotherapy? No Radiation therapy? Yes; Date Range: 12/12/2015 - 01/26/2016; Target: Right lung Other Cancer Therapies: Talc pleurodesis 03/01/2017. EXAM: CT CHEST WITHOUT CONTRAST TECHNIQUE: Multidetector CT imaging of the chest was performed following the standard protocol without IV contrast. COMPARISON:  Most recent CT chest 12/14/2019. FINDINGS: Cardiovascular: Aortic atherosclerosis. Normal heart size. Extensive 3 vessel coronary artery calcifications and/or stents. No pericardial effusion. Mediastinum/Nodes: Unchanged prominent subcarinal lymph node measuring 1.9 x 1.1 cm (series 2, image 68). Thyroid gland, trachea, and esophagus demonstrate no significant findings. Lungs/Pleura: Unchanged post treatment appearance of the chest with dense fibrotic consolidation and volume loss of the paramedian right upper lobe and perihilar right lung. Small, chronic, loculated right pleural effusion with pleural calcification, perhaps related to prior talc pleurodesis. Unchanged minimal fibrosis of the paramedian left upper lobe. Upper Abdomen: No acute abnormality. Musculoskeletal: No chest wall mass or suspicious bone lesions identified. Unchanged inferior endplate deformity of the T4 vertebral body. IMPRESSION: 1. Unchanged post treatment  appearance of the chest with dense fibrotic consolidation and volume loss of the paramedian right upper lobe and perihilar right lung. 2. Small, chronic, loculated right pleural effusion. 3. Unchanged prominent subcarinal lymph node. Attention on follow-up. 4. Coronary artery disease.  Aortic Atherosclerosis (ICD10-I70.0). Electronically Signed   By: Eddie Candle M.D.   On: 06/09/2020 10:40    ASSESSMENT AND PLAN:  This is a very pleasant 79 years old white male with a stage IIIA non-small cell lung cancer status post a course of concurrent chemoradiation with weekly carboplatin and paclitaxel and he had a rough time with the treatment at that time. He did not receive consolidation chemotherapy. The patient is currently on observation and he is feeling fine today with no concerning complaints. He had repeat CT scan of the chest performed recently.  I personally and independently reviewed the scans and discussed the results with the patient today. His scan showed no concerning findings for disease progression. I recommended for him to continue on observation with repeat CT scan of the chest in 6 months. For the renal insufficiency, he is currently followed by his primary care physician. The patient was advised to call immediately if he has any concerning symptoms in the interval. The patient voices understanding of current disease status and treatment options and is in agreement with the current care plan. All questions were answered. The patient knows to call the clinic with any problems, questions or concerns. We can certainly see the patient much sooner if necessary.   Disclaimer: This note was dictated with voice recognition software. Similar sounding words can inadvertently be transcribed and may not be corrected upon review.

## 2020-06-16 ENCOUNTER — Telehealth: Payer: Self-pay | Admitting: Pulmonary Disease

## 2020-06-16 NOTE — Telephone Encounter (Signed)
Called and spoke to pt. Pt states he has been feeling worse after finishing a 6 day course of pred given by PCP, ending on 9/30. Pt states he was given doxy on 9/13 and began to feel better then started to feel worse soon after completing the medication. Pt was then given 6 day pred by PCP and again felt better while taking but once he completed the medication his s/s returned. Pt c/o recurrent prod cough with yellow/green mucus, chest congestion, increase in SOB when coughing, fatigue, and wheezing. Pt denies CP/tightness, f/c/s, swelling, covid contacts. Pt is fully vaccinated.  Pt states he could come in for an appt on Monday 10/25 if Dr. Elsworth Soho wants him seen in office.   Pt also received notice that his Symbicort will no longer be covered and the covered alternative is Advair 100/50 diskus.     Dr. Elsworth Soho, please advise. Thanks.

## 2020-06-16 NOTE — Telephone Encounter (Signed)
Please make office visit -next available with APP/me If he needs prescription before, okay to fill Advair 100/50

## 2020-06-16 NOTE — Telephone Encounter (Signed)
Spoke with pt, aware of recs.  Scheduled to see Aaron Edelman tomorrow at 3:30pm.  Nothing further needed at this time- will close encounter.

## 2020-06-17 ENCOUNTER — Encounter: Payer: Self-pay | Admitting: Pulmonary Disease

## 2020-06-17 ENCOUNTER — Ambulatory Visit (INDEPENDENT_AMBULATORY_CARE_PROVIDER_SITE_OTHER): Payer: Medicare Other | Admitting: Pulmonary Disease

## 2020-06-17 ENCOUNTER — Other Ambulatory Visit: Payer: Self-pay

## 2020-06-17 VITALS — BP 122/58 | HR 76 | Temp 97.2°F | Ht 70.5 in | Wt 182.1 lb

## 2020-06-17 DIAGNOSIS — R059 Cough, unspecified: Secondary | ICD-10-CM | POA: Diagnosis not present

## 2020-06-17 DIAGNOSIS — J90 Pleural effusion, not elsewhere classified: Secondary | ICD-10-CM

## 2020-06-17 DIAGNOSIS — C3411 Malignant neoplasm of upper lobe, right bronchus or lung: Secondary | ICD-10-CM

## 2020-06-17 DIAGNOSIS — K219 Gastro-esophageal reflux disease without esophagitis: Secondary | ICD-10-CM | POA: Diagnosis not present

## 2020-06-17 DIAGNOSIS — J449 Chronic obstructive pulmonary disease, unspecified: Secondary | ICD-10-CM

## 2020-06-17 MED ORDER — FLUTICASONE PROPIONATE 50 MCG/ACT NA SUSP: 1.0000 | Freq: Every day | NASAL | 3 refills | Status: DC

## 2020-06-17 MED ORDER — CETIRIZINE HCL 10 MG PO TABS: 10.0000 mg | ORAL_TABLET | Freq: Every day | ORAL | 3 refills | Status: DC

## 2020-06-17 NOTE — Progress Notes (Signed)
@Patient  ID: Paul Ewing., male    DOB: [birth year], 79 y.o.   MRN: [id number]  Chief Complaint  Patient presents with  . Acute Visit    COPD with more coughing past month. sometimes green or yellow.    Referring provider: Erven Colla, DO  HPI:  79 year old male former smoker followed in our office for chronic obstructive airways disease and asthma  PMH: Hyperlipidemia, hypertension, GERD, CAD Smoker/ Smoking History: Former smoker.  Quit 1996.  45-pack-year smoking history Maintenance: Symbicort 80 Pt of: Dr. Elsworth Soho  06/17/2020  - Visit   79 year old male former smoker followed in our office for chronic obstructive airways disease and asthma.  He is followed by Dr. Elsworth Soho.  He was last seen in our office in August/2021.  Plan of care at that office visit was for the patient to remain on Symbicort 2 puffs twice daily.  It was felt that his cough may be related to [facility] but he has clear mortality benefit to remain on this.  He was encouraged to follow-up in 6 months.  Patient presented to an office visit with [facility] in September/2021 where he was treated for cough lab work and chest x-ray was completed prednisone was sent in by PCP.  [facility] Symb[facility] so patient was switched to [place].  Patient contacted our office on 06/16/2020 reporting that he had been treated by his PCP with prednisone and also doxycycline and a second course of prednisone.  Patient completed follow-up with Dr. Earlie Server on 06/13/2020 assessment and plan from that office visit is listed below:  ASSESSMENT AND PLAN:  This is a very pleasant 79 years old white male with a stage IIIA non-small cell lung cancer status post a course of concurrent chemoradiation with weekly carboplatin and paclitaxel and he had a rough time with the treatment at that time. He did not receive consolidation chemotherapy. The patient is currently on observation and he is feeling fine today with  no concerning complaints. He had repeat CT scan of the chest performed recently.  I personally and independently reviewed the scans and discussed the results with the patient today. His scan showed no concerning findings for disease progression. I recommended for him to continue on observation with repeat CT scan of the chest in 6 months. For the renal insufficiency, he is currently followed by his primary care physician. The patient was advised to call immediately if he has any concerning symptoms in the interval. The patient voices understanding of current disease status and treatment options and is in agreement with the current care plan. All questions were answered. The patient knows to call the clinic with any problems, questions or concerns. We can certainly see the patient much sooner if necessary.  Patient presented to our office today due to ongoing cyclical cough.  Patient reports the cough did go away with prednisone.  He has a worsening cough when lying flat.  He also coughs up more mucus in the morning.  [facility] is [facility].  He has 1/2 months of Symbicort 80 left then he will transition to [place] 100. Wants assurances inhalers are the same  Finishing symbicort over the next 1.5 months then will start [place] / Pulmonary Flowsheets:   ACT:  No flowsheet data found.  MMRC: No flowsheet data found.  Epworth:  No flowsheet data found.  Tests:    11/2019 CT chest no evidence of recurrence,  right upper lobe fibrosis stable, chronic loculated right pleural effusion  05/2019 CT chest  Stable appearance of mildly enlarged subcarinal lymph node. Stable loculated effusions post talc pleurodesis and paramediastinal and right upper lobe fibrosis post radiation  04/09/2018-echocardiogram-LV ejection fraction 25 to 30%, diffuse hypokinesis,  02/01/2016-pulmonary function test-FVC 1.96 (45% predicted), postbronchodilator ratio of 67,  postbronchodilator FEV1 1.64 (53% predicted, postbronchodilator response, significant mid flow reversibility, DLCO 58  06/09/2020-CT chest without contrast-unchanged posttreatment appearance of chest with dense fibrotic consolidation and volume loss of the paramedian right upper lobe and perihilar right lung, small chronic loculated right pleural effusion, unchanged prominent subcarinal lymph node, CAD  FENO:  No results found for: NITRICOXIDE  PFT: PFT Results Latest Ref Rng & Units 02/01/2016  FVC-Predicted Pre % 45  FVC-Post L 2.46  FVC-Predicted Post % 57  Pre FEV1/FVC % % 64  Post FEV1/FCV % % 67  FEV1-Pre L 1.25  FEV1-Predicted Pre % 40  FEV1-Post L 1.64  DLCO uncorrected ml/min/mmHg 19.38  DLCO UNC% % 58  DLCO corrected ml/min/mmHg 23.41  DLCO COR %Predicted % 70  DLVA Predicted % 109  TLC L 5.96  TLC % Predicted % 83  RV % Predicted % 149    WALK:  SIX MIN WALK 11/05/2016  Supplimental Oxygen during Test? (L/min) No  Tech Comments: pt completed all 3 laps without difficulty.     Imaging: DG Chest 2 View  Result Date: 05/20/2020 CLINICAL DATA:  Cough.  History of lung cancer on the right EXAM: CHEST - 2 VIEW COMPARISON:  02/16/2020 FINDINGS: Stable post treatment changes on the right volume loss, dense right apical opacity, and costophrenic sulcus blunting. There is a chronic right pleural effusion and radiation fibrosis on a 12/14/2019 chest CT. The left lung is stable from prior. No acute airspace disease. Normal heart size and stable mediastinal contours. CABG. IMPRESSION: Stable post treatment chest.  No visible pneumonia. Electronically Signed   By: Monte Fantasia M.D.   On: 05/20/2020 11:23   CT Chest Wo Contrast  Result Date: 06/09/2020 CLINICAL DATA:  Primary Cancer Type: Lung Imaging Indication: Routine surveillance Interval therapy since last imaging? No Initial Cancer Diagnosis Date: 10/2015; Established by: Presumed; Cytology was negative for malignancy.  Detailed Pathology: Stage IIIA non-small cell lung cancer, favoring squamous cell carcinoma. Primary Tumor location: Right upper lobe. Surgeries: AAA and coronary stents. Chemotherapy: Yes; Ongoing?  No; Most recent administration: 12/2015 Immunotherapy? No Radiation therapy? Yes; Date Range: 12/12/2015 - 01/26/2016; Target: Right lung Other Cancer Therapies: Talc pleurodesis 03/01/2017. EXAM: CT CHEST WITHOUT CONTRAST TECHNIQUE: Multidetector CT imaging of the chest was performed following the standard protocol without IV contrast. COMPARISON:  Most recent CT chest 12/14/2019. FINDINGS: Cardiovascular: Aortic atherosclerosis. Normal heart size. Extensive 3 vessel coronary artery calcifications and/or stents. No pericardial effusion. Mediastinum/Nodes: Unchanged prominent subcarinal lymph node measuring 1.9 x 1.1 cm (series 2, image 68). Thyroid gland, trachea, and esophagus demonstrate no significant findings. Lungs/Pleura: Unchanged post treatment appearance of the chest with dense fibrotic consolidation and volume loss of the paramedian right upper lobe and perihilar right lung. Small, chronic, loculated right pleural effusion with pleural calcification, perhaps related to prior talc pleurodesis. Unchanged minimal fibrosis of the paramedian left upper lobe. Upper Abdomen: No acute abnormality. Musculoskeletal: No chest wall mass or suspicious bone lesions identified. Unchanged inferior endplate deformity of the T4 vertebral body. IMPRESSION: 1. Unchanged post treatment appearance of the chest with dense fibrotic consolidation and volume loss of the paramedian right upper lobe  and perihilar right lung. 2. Small, chronic, loculated right pleural effusion. 3. Unchanged prominent subcarinal lymph node. Attention on follow-up. 4. Coronary artery disease.  Aortic Atherosclerosis (ICD10-I70.0). Electronically Signed   By: Eddie Candle M.D.   On: 06/09/2020 10:40    Lab Results:  CBC    Component Value Date/Time     WBC 9.9 06/09/2020 0849   WBC 8.7 05/20/2020 1130   RBC 4.04 (L) 06/09/2020 0849   HGB 11.4 (L) 06/09/2020 0849   HGB 11.9 (L) 05/23/2020 0821   HGB 11.1 (L) 05/21/2017 0906   HCT 35.6 (L) 06/09/2020 0849   HCT 37.0 (L) 05/23/2020 0821   HCT 36.0 (L) 05/21/2017 0906   PLT 109 (L) 06/09/2020 0849   PLT 98 (L) 05/21/2017 0906   PLT 138 (L) 02/15/2016 0843   MCV 88.1 06/09/2020 0849   MCV 89 05/23/2020 0821   MCV 86.5 05/21/2017 0906   MCH 28.2 06/09/2020 0849   MCHC 32.0 06/09/2020 0849   RDW 15.0 06/09/2020 0849   RDW 14.8 05/23/2020 0821   RDW 17.5 (H) 05/21/2017 0906   LYMPHSABS 0.8 06/09/2020 0849   LYMPHSABS 1.1 05/23/2020 0821   LYMPHSABS 0.8 (L) 05/21/2017 0906   MONOABS 0.8 06/09/2020 0849   MONOABS 0.8 05/21/2017 0906   EOSABS 0.1 06/09/2020 0849   EOSABS 0.0 05/23/2020 0821   BASOSABS 0.0 06/09/2020 0849   BASOSABS 0.0 05/23/2020 0821   BASOSABS 0.0 05/21/2017 0906    BMET    Component Value Date/Time   NA 139 06/09/2020 0849   NA 140 05/23/2020 0821   NA 137 05/21/2017 0906   K 4.1 06/09/2020 0849   K 4.1 05/21/2017 0906   CL 102 06/09/2020 0849   CO2 31 06/09/2020 0849   CO2 29 05/21/2017 0906   GLUCOSE 96 06/09/2020 0849   GLUCOSE 95 05/21/2017 0906   BUN 21 06/09/2020 0849   BUN 28 (H) 05/23/2020 0821   BUN 14.6 05/21/2017 0906   CREATININE 1.88 (H) 06/09/2020 0849   CREATININE 1.2 05/21/2017 0906   CALCIUM 9.3 06/09/2020 0849   CALCIUM 9.4 05/21/2017 0906   GFRNONAA 33 (L) 06/09/2020 0849   GFRAA 42 (L) 05/23/2020 0821   GFRAA 43 (L) 12/14/2019 0904    BNP    Component Value Date/Time   BNP 944.5 (H) 04/22/2017 1514    ProBNP    Component Value Date/Time   PROBNP 413.0 (H) 09/19/2017 1004    Specialty Problems      Pulmonary Problems   Chronic obstructive airway disease with asthma ([place])    02/01/2016-pulmonary function test- FVC 1.96 (45% predicted), postbronchodilator ratio of 67, postbronchodilator FEV1 1.64 (53% predicted,  postbronchodilator response, significant mid flow reversibility, DLCO 58      Primary cancer of right upper lobe of lung (HCC)   Pleural effusion   Bacterial colonization of lower respiratory tract    03/07/2017-respiratory sputum culture- Serratia Mer since, resistant to cefazolin, sensitive to ciprofloxacin, Bactrim  07/31/2017-respiratory sputum- M catarrhalis      Cough      Allergies  Allergen Reactions  . Neomycin Hives    Immunization History  Administered Date(s) Administered  . Fluad Quad(high Dose 65+) 05/19/2019  . H1N1 08/06/2008  . Influenza Split 06/24/2013  . Influenza,inj,Quad PF,6+ Mos 06/15/2014, 05/31/2015, 05/09/2016, 06/06/2017, 06/10/2018  . Influenza-Unspecified 05/27/2012, 05/30/2020  . Moderna SARS-COVID-2 Vaccination 09/11/2019, 10/06/2019  . Pneumococcal Conjugate-13 08/18/2014  . Pneumococcal Polysaccharide-23 03/27/2012  . Zoster 07/27/2008    Past Medical History:  Diagnosis Date  . AAA (abdominal aortic aneurysm) ([place]) 2010   4.4 cm 08/2008;4.44 in 7/10 and 4.65 in 08/2009; 4.8 by CT in 11/2009; 4.3 by ultrasound in 08/2010  . Anemia   . Arteriosclerotic cardiovascular disease (ASCVD) 1996   CABG-1996  . Arthritis    "fingers" (03/18/2014)  . CAD (coronary artery disease)    03/18/14:  PCI with DES to distal left main. 7/29: DES to the SVG to Diag  . Cancer ([place])    Upper right lobe lung cancer  . Cardiomyopathy, ischemic    Echo 03/17/14: EF 45-50%  . Chronic bronchitis ([place])   . Chronic kidney disease    CRF  . Chronic rhinitis   . Colonic polyp 2002   polypectomy in 2002  . COPD (chronic obstructive pulmonary disease) ([place])   . Diverticulosis   . Dyspnea    with exertion  . ED (erectile dysfunction)   . Encounter for antineoplastic chemotherapy 12/19/2015  . GERD (gastroesophageal reflux disease)   . History of blood transfusion   . Hyperlipidemia   . Hypertension   . IFG (impaired fasting glucose)   . Myocardial infarction  [facility])    "told h/o silent MI sometime before 1996"  . Pneumonia ~ 2001; ~ 2005    has had more than twice  . Right bundle branch block   . Tobacco abuse, in remission    40 pack year total consumption; discontinued in 1996    Tobacco History: Social History   Tobacco Use  Smoking Status Former Smoker  . Packs/day: 1.50  . Years: 30.00  . Pack years: 45.00  . Types: Cigarettes  . Start date: 12/01/1956  . Quit date: 01/08/1995  . Years since quitting: 25.4  Smokeless Tobacco Never Used   Counseling given: Not Answered   Continue to not smoke  Outpatient Encounter Medications as of 06/17/2020  Medication Sig  . acetaminophen (TYLENOL) 500 MG tablet Take 500 mg by mouth every 6 (six) hours as needed for mild pain.  Marland Kitchen albuterol (PROAIR HFA) 108 (90 Base) MCG/ACT inhaler INHALE 2 PUFFS BY MOUTH EVERY 4 TO 6 HOURS AS NEEDED FOR WHEEZING.  Marland Kitchen aspirin EC 81 MG tablet Take 81 mg by mouth daily before breakfast.   . carvedilol (COREG) 12.5 MG tablet Take 1.5 tablets (18.75 mg total) by mouth 2 (two) times daily with a meal.  . clopidogrel (PLAVIX) 75 MG tablet TAKE (1) TABLET BY MOUTH ONCE DAILY.  . dapagliflozin propanediol (FARXIGA) 10 MG TABS tablet Take 10 mg by mouth daily before breakfast.  . eplerenone (INSPRA) 50 MG tablet Take 1 tablet (50 mg total) by mouth every evening.  . furosemide (LASIX) 40 MG tablet Take 1 tablet (40 mg total) by mouth daily.  . GuaiFENesin (MUCUS RELIEF ADULT PO) Take 2 capsules by mouth as needed.   Marland Kitchen ipratropium-albuterol (DUONEB) 0.5-2.5 (3) MG/3ML SOLN INHALE 1 VIAL VIA NEBULIZER 4 TIMES DAILY.  . Multiple Vitamins-Minerals (CENTRUM SILVER ADULT 50+) TABS Take 1 tablet by mouth daily with lunch.   . nitroGLYCERIN (NITROSTAT) 0.4 MG SL tablet PLACE 1 TAB UNDER TONGUE EVERY 5 MIN IF NEEDED FOR CHEST PAIN. MAY USE 3 TIMES.NO RELIEF CALL 911.  . omeprazole (PRILOSEC) 20 MG capsule TAKE ONE CAPSULE BY MOUTH DAILY.  . sacubitril-valsartan (ENTRESTO) 49-51  MG TAKE (1) TABLET BY MOUTH TWICE DAILY.  . budesonide-formoterol (SYMBICORT) 160-4.5 MCG/ACT inhaler Inhale 2 puffs into the lungs 2 (two) times daily.  . cetirizine (ZYRTEC) 10 MG tablet  Take 1 tablet (10 mg total) by mouth at bedtime.  . fluticasone (FLONASE) 50 MCG/ACT nasal spray Place 1 spray into both nostrils daily.  . rosuvastatin (CRESTOR) 20 MG tablet Take 1 tablet (20 mg total) by mouth daily.  . [DISCONTINUED] predniSONE (DELTASONE) 10 MG tablet Take 3 tab p.o. 2 days,then 2 tab p.o. x 2 days, then 1 tab p.o. x 2 days.   No facility-administered encounter medications on file as of 06/17/2020.     Review of Systems  Review of Systems  Constitutional: Negative for activity change, chills, fatigue, fever and unexpected weight change.  HENT: Positive for congestion. Negative for postnasal drip, rhinorrhea, sinus pressure, sinus pain and sore throat.   Eyes: Negative.   Respiratory: Positive for cough. Negative for shortness of breath and wheezing.   Cardiovascular: Negative for chest pain and palpitations.  Gastrointestinal: Negative for constipation, diarrhea, nausea and vomiting.  Endocrine: Negative.   Genitourinary: Negative.   Musculoskeletal: Negative.   Skin: Negative.   Neurological: Negative for dizziness and headaches.  Psychiatric/Behavioral: Negative.  Negative for dysphoric mood. The patient is not nervous/anxious.   All other systems reviewed and are negative.    Physical Exam  BP (!) 122/58 (BP Location: Left Arm, Cuff Size: Normal)   Pulse 76   Temp (!) 97.2 F (36.2 C) (Oral)   Ht 5' 10.5" (1.791 m)   Wt 182 lb 1.6 oz (82.6 kg)   SpO2 97%   BMI 25.76 kg/m   Wt Readings from Last 5 Encounters:  06/17/20 182 lb 1.6 oz (82.6 kg)  06/13/20 182 lb 9.6 oz (82.8 kg)  05/20/20 182 lb 9.6 oz (82.8 kg)  04/21/20 184 lb 6.4 oz (83.6 kg)  04/05/20 180 lb 9.6 oz (81.9 kg)    BMI Readings from Last 5 Encounters:  06/17/20 25.76 kg/m  06/13/20 26.20  kg/m  05/20/20 26.20 kg/m  04/21/20 26.46 kg/m  04/05/20 25.91 kg/m     Physical Exam Vitals and nursing note reviewed.  Constitutional:      General: He is not in acute distress.    Appearance: Normal appearance. He is normal weight.  HENT:     Head: Normocephalic and atraumatic.     Right Ear: Hearing, tympanic membrane, ear canal and external ear normal.     Left Ear: Hearing, tympanic membrane, ear canal and external ear normal.     Nose: Congestion and rhinorrhea present. No mucosal edema.     Right Turbinates: Not enlarged.     Left Turbinates: Not enlarged.     Mouth/Throat:     Mouth: Mucous membranes are dry.     Pharynx: Oropharynx is clear. No oropharyngeal exudate.     Comments: +PND Eyes:     Pupils: Pupils are equal, round, and reactive to light.  Cardiovascular:     Rate and Rhythm: Normal rate and regular rhythm.     Pulses: Normal pulses.     Heart sounds: Normal heart sounds. No murmur heard.   Pulmonary:     Effort: Pulmonary effort is normal.     Breath sounds: Normal breath sounds. No decreased breath sounds, wheezing or rales.  Abdominal:     General: Bowel sounds are normal. There is no distension.     Palpations: Abdomen is soft.     Tenderness: There is no abdominal tenderness.  Musculoskeletal:     Cervical back: Normal range of motion.     Right lower leg: No edema.     Left  lower leg: No edema.  Lymphadenopathy:     Cervical: No cervical adenopathy.  Skin:    General: Skin is warm and dry.     Capillary Refill: Capillary refill takes less than 2 seconds.     Findings: No erythema or rash.  Neurological:     General: No focal deficit present.     Mental Status: He is alert and oriented to person, place, and time.     Motor: No weakness.     Coordination: Coordination normal.     Gait: Gait is intact. Gait normal.  Psychiatric:        Mood and Affect: Mood normal.        Behavior: Behavior normal. Behavior is cooperative.         Thought Content: Thought content normal.        Judgment: Judgment normal.       Assessment & Plan:   Primary cancer of right upper lobe of lung (HCC) Plan: Keep follow-up with Dr. Earlie Server  Pleural effusion Stable on last imaging  Chronic obstructive airway disease with asthma ([place]) Plan: Continue Symbicort 80 right now, then transition to [place] 100 Start taking daily antihistamine Start taking fluticasone nasal spray  GERD (gastroesophageal reflux disease) Continue current medications  Cough Known COPD Former smoker Rhinorrhea, congestion, postnasal drip present on exam Known history of GERD but currently well managed on medication  Plan: Start taking daily antihistamine Start taking fluticasone nasal spray Can start nasal saline rinses Continue GERD medications Follow-up in 6 to 8 weeks to ensure cough is improving with these treatment recommendations    Return in about 2 months (around 08/17/2020), or if symptoms worsen or fail to improve, for Follow up with Dr. Elsworth Soho.   Lauraine Rinne, NP 06/17/2020   This appointment required 32 minutes of patient care (this includes precharting, chart review, review of results, face-to-face care, etc.).

## 2020-06-17 NOTE — Assessment & Plan Note (Signed)
Known COPD Former smoker Rhinorrhea, congestion, postnasal drip present on exam Known history of GERD but currently well managed on medication  Plan: Start taking daily antihistamine Start taking fluticasone nasal spray Can start nasal saline rinses Continue GERD medications Follow-up in 6 to 8 weeks to ensure cough is improving with these treatment recommendations

## 2020-06-17 NOTE — Assessment & Plan Note (Signed)
Continue current medications. 

## 2020-06-17 NOTE — Patient Instructions (Addendum)
You were seen today by Lauraine Rinne, NP  for:   1. Cough  - cetirizine (ZYRTEC) 10 MG tablet; Take 1 tablet (10 mg total) by mouth at bedtime.  Dispense: 30 tablet; Refill: 3 - fluticasone (FLONASE) 50 MCG/ACT nasal spray; Place 1 spray into both nostrils daily.  Dispense: 16 g; Refill: 3  Start taking daily cetirizine/Zyrtec  Start using Flonase 1 spray each nostril daily for nasal congestion allergy-like symptoms  Can also consider starting nasal saline rinses twice daily Use distilled water Shake well Get bottle lukewarm like a baby bottle Use Flonase after nasal saline rinse   2. Chronic obstructive airway disease with asthma (HCC)  Continue Symbicort 80 >>> 2 puffs in the morning right when you wake up, rinse out your mouth after use, 12 hours later 2 puffs, rinse after use >>> Take this daily, no matter what >>> This is not a rescue inhaler   When finished with Symbicort 80 okay to transition to [place] 100 as discussed today  Note your daily symptoms > remember "red flags" for COPD:   >>>Increase in cough >>>increase in sputum production >>>increase in shortness of breath or activity  intolerance.   If you notice these symptoms, please call the office to be seen.    3. Primary cancer of right upper lobe of lung ([place]) 4. Pleural effusion  Continue imaging follow-up as outlined by Dr. Earlie Server with oncology  Continue follow-up with oncology  5. Gastroesophageal reflux disease without esophagitis  Omeprazole 20 mg tablet  >>>Please take 1 tablet daily 15 minutes to 30 minutes before your first meal of the day as well as before your other medications >>>Try to take at the same time each day >>>take this medication daily  GERD management: >>>Avoid laying flat until 2 hours after meals >>>Elevate head of the bed including entire chest >>>Reduce size of meals and amount of fat, acid, spices, caffeine and sweets >>>If you are smoking, Please stop! >>>Decrease  alcohol consumption >>>Work on maintaining a healthy weight with normal BMI    We recommend today:   Meds ordered this encounter  Medications  . cetirizine (ZYRTEC) 10 MG tablet    Sig: Take 1 tablet (10 mg total) by mouth at bedtime.    Dispense:  30 tablet    Refill:  3  . fluticasone (FLONASE) 50 MCG/ACT nasal spray    Sig: Place 1 spray into both nostrils daily.    Dispense:  16 g    Refill:  3    Follow Up:    Return in about 2 months (around 08/17/2020), or if symptoms worsen or fail to improve, for Follow up with Dr. Elsworth Soho.   Notification of test results are managed in the following manner: If there are  any recommendations or changes to the  plan of care discussed in office today,  we will contact you and let you know what they are. If you do not hear from [place], then your results are normal and you can view them through your  MyChart account , or a letter will be sent to you. Thank you again for trusting [place] with your care  - Thank you, [facility]    It is flu season:   >>> Best ways to protect herself from the flu: Receive the yearly flu vaccine, practice good hand hygiene washing with soap and also using hand sanitizer when available, eat a nutritious meals, get adequate rest, hydrate appropriately       Please  contact the office if your symptoms worsen or you have concerns that you are not improving.   Thank you for choosing [facility] for your healthcare, and for allowing [place] to partner with you on your healthcare journey. I am thankful to be able to provide care to you today.   Wyn Quaker FNP-C

## 2020-06-17 NOTE — Assessment & Plan Note (Signed)
Plan: Continue Symbicort 80 right now, then transition to [place] 100 Start taking daily antihistamine Start taking fluticasone nasal spray

## 2020-06-17 NOTE — Telephone Encounter (Signed)
-  prednisone sent in to pharmacy for pt. Dr. Lovena Le

## 2020-06-17 NOTE — Assessment & Plan Note (Signed)
Stable on last imaging.

## 2020-06-17 NOTE — Assessment & Plan Note (Signed)
Plan: Keep follow-up with Dr. Earlie Server

## 2020-06-23 ENCOUNTER — Other Ambulatory Visit: Payer: Self-pay

## 2020-06-23 ENCOUNTER — Encounter: Payer: Self-pay | Admitting: Family Medicine

## 2020-06-23 ENCOUNTER — Ambulatory Visit (INDEPENDENT_AMBULATORY_CARE_PROVIDER_SITE_OTHER): Payer: Medicare Other | Admitting: Family Medicine

## 2020-06-23 VITALS — HR 85 | Temp 97.6°F | Resp 16

## 2020-06-23 DIAGNOSIS — R059 Cough, unspecified: Secondary | ICD-10-CM

## 2020-06-23 NOTE — Progress Notes (Signed)
Patient ID: Ekin Pilar., male    DOB: 11/24/1940, 79 y.o.   MRN: [id number]   Chief Complaint  Patient presents with  . cough and congestion    wife just tested positive for covid-has COPD and hx of lung cancer   Subjective:    Presents today for an outside visit with complaint of cough and congestion present for 1.5 months.  The reason for the visit today, is his wife was diagnosed with Covid infection today.  Her symptoms also started about 2 weeks ago.  Mr. Searls denies fever, chills, fatigue, headache.  He does have a history of COPD and heart failure, the shortness of breath, congestion, cough that he is experiencing is not new or worse than his normal.  He denies abdominal pain, no nausea vomiting or diarrhea.  He is getting tested for Covid, as a precaution due to exposure by his wife.  He does not feel sick, nor looks sick today.  He was last seen by Dr. Lovena Le on September 24, and was given prednisone and antibiotics at that time.  He does report that his breathing is a little better.    Medical History Orson has a past medical history of AAA (abdominal aortic aneurysm) ([place]) (2010), Anemia, Arteriosclerotic cardiovascular disease (ASCVD) (1996), Arthritis, CAD (coronary artery disease), Cancer ([place]), Cardiomyopathy, ischemic, Chronic bronchitis ([place]), Chronic kidney disease, Chronic rhinitis, Colonic polyp (2002), COPD (chronic obstructive pulmonary disease) ([place]), Diverticulosis, Dyspnea, ED (erectile dysfunction), Encounter for antineoplastic chemotherapy (12/19/2015), GERD (gastroesophageal reflux disease), History of blood transfusion, Hyperlipidemia, Hypertension, IFG (impaired fasting glucose), Myocardial infarction ([place]), Pneumonia (~ 2001; ~ 2005), Right bundle branch block, and Tobacco abuse, in remission.   Outpatient Encounter Medications as of 06/23/2020  Medication Sig  . acetaminophen (TYLENOL) 500 MG tablet Take 500 mg by mouth every 6 (six) hours as needed  for mild pain.  Marland Kitchen albuterol (PROAIR HFA) 108 (90 Base) MCG/ACT inhaler INHALE 2 PUFFS BY MOUTH EVERY 4 TO 6 HOURS AS NEEDED FOR WHEEZING.  Marland Kitchen aspirin EC 81 MG tablet Take 81 mg by mouth daily before breakfast.   . budesonide-formoterol (SYMBICORT) 160-4.5 MCG/ACT inhaler Inhale 2 puffs into the lungs 2 (two) times daily.  . carvedilol (COREG) 12.5 MG tablet Take 1.5 tablets (18.75 mg total) by mouth 2 (two) times daily with a meal.  . cetirizine (ZYRTEC) 10 MG tablet Take 1 tablet (10 mg total) by mouth at bedtime.  . clopidogrel (PLAVIX) 75 MG tablet TAKE (1) TABLET BY MOUTH ONCE DAILY.  . dapagliflozin propanediol (FARXIGA) 10 MG TABS tablet Take 10 mg by mouth daily before breakfast.  . eplerenone (INSPRA) 50 MG tablet Take 1 tablet (50 mg total) by mouth every evening.  . fluticasone (FLONASE) 50 MCG/ACT nasal spray Place 1 spray into both nostrils daily.  . furosemide (LASIX) 40 MG tablet Take 1 tablet (40 mg total) by mouth daily.  . GuaiFENesin (MUCUS RELIEF ADULT PO) Take 2 capsules by mouth as needed.   Marland Kitchen ipratropium-albuterol (DUONEB) 0.5-2.5 (3) MG/3ML SOLN INHALE 1 VIAL VIA NEBULIZER 4 TIMES DAILY.  . Multiple Vitamins-Minerals (CENTRUM SILVER ADULT 50+) TABS Take 1 tablet by mouth daily with lunch.   . nitroGLYCERIN (NITROSTAT) 0.4 MG SL tablet PLACE 1 TAB UNDER TONGUE EVERY 5 MIN IF NEEDED FOR CHEST PAIN. MAY USE 3 TIMES.NO RELIEF CALL 911.  . omeprazole (PRILOSEC) 20 MG capsule TAKE ONE CAPSULE BY MOUTH DAILY.  . rosuvastatin (CRESTOR) 20 MG tablet Take 1 tablet (20 mg total)  by mouth daily.  . sacubitril-valsartan (ENTRESTO) 49-51 MG TAKE (1) TABLET BY MOUTH TWICE DAILY.   No facility-administered encounter medications on file as of 06/23/2020.     Review of Systems  Constitutional: Negative for chills and fever.  HENT: Positive for congestion.        Not new symptom  Respiratory: Positive for cough and shortness of breath.        Not new symptom: has COPD and HF.    Cardiovascular: Negative for chest pain.  Gastrointestinal: Negative for abdominal pain, diarrhea, nausea and vomiting.  Skin: Negative.   Allergic/Immunologic: Negative.      Vitals Pulse 85   Temp 97.6 F (36.4 C)   Resp 16   SpO2 97%   Objective:   Physical Exam Vitals and nursing note reviewed.  Constitutional:      General: He is not in acute distress.    Appearance: Normal appearance. He is not ill-appearing.  HENT:     Right Ear: Tympanic membrane normal.     Left Ear: Tympanic membrane normal.     Nose: Nose normal.     Mouth/Throat:     Mouth: Mucous membranes are moist.     Pharynx: Oropharynx is clear. No oropharyngeal exudate or posterior oropharyngeal erythema.  Cardiovascular:     Rate and Rhythm: Normal rate and regular rhythm.     Heart sounds: Normal heart sounds.  Pulmonary:     Effort: Pulmonary effort is normal.     Breath sounds: Normal breath sounds.  Skin:    General: Skin is warm and dry.  Neurological:     Mental Status: He is alert and oriented to person, place, and time.  Psychiatric:        Mood and Affect: Mood normal.        Behavior: Behavior normal.        Thought Content: Thought content normal.        Judgment: Judgment normal.      Assessment and Plan   1. Cough - Novel Coronavirus, NAA ([facility])   Due to patient's significant medical history, Covid test performed today.  He has been exposed to his wife, who is tested positive for Covid.  His vital signs are good today oxygen saturation 97%.  Lungs are clear.  He does not look ill.  Agrees with plan of care discussed today. Understands warning signs to seek further care: Fever, worsening shortness of breath, chest pain, any significant changes in his health status.  We will notify him once the Covid test results are available.  If positive, he will be referred to the infusion clinic in [place]. Understands to follow-up if he worsens.  Strict instructions given to him on  when to return for higher level of care.  Pecolia Ades, FNP-C

## 2020-06-24 LAB — NOVEL CORONAVIRUS, NAA: SARS-CoV-2, NAA: NOT DETECTED

## 2020-06-24 LAB — SARS-COV-2, NAA 2 DAY TAT

## 2020-06-24 LAB — SPECIMEN STATUS REPORT

## 2020-06-25 ENCOUNTER — Telehealth: Payer: Self-pay | Admitting: Unknown Physician Specialty

## 2020-06-25 NOTE — Telephone Encounter (Signed)
Called to discuss with Paul Sandoval. about Covid symptoms and the use of  monoclonal antibody infusion for those with mild to moderate Covid symptoms and at a high risk of hospitalization.     Pt does not qualify for infusion therapy as his symptoms first presented > 10 days prior to timing of infusion. Symptoms tier reviewed as well as criteria for ending isolation. Preventative practices reviewed. Patient verbalized understanding   Patient Active Problem List   Diagnosis Date Noted   Cough 06/17/2020   Bacterial colonization of lower respiratory tract 03/17/2019   History of thoracic surgery 04/24/2017   History of endovascular stent graft for abdominal aortic aneurysm (AAA) 01/02/2017   S/P CABG (coronary artery bypass graft) 11/05/2016   Cardiomyopathy, ischemic 11/05/2016   Chronic combined systolic and diastolic congestive heart failure ([place]) 08/06/2016   Pleural effusion    Acute on chronic combined systolic and diastolic heart failure ([place])    Hypoalbuminemia due to protein-calorie malnutrition ([place]) 02/21/2016   Peripheral edema 02/17/2016   Acute on chronic renal failure ([place]) 02/05/2016   HTN (hypertension) 02/05/2016   AAA (abdominal aortic aneurysm) without rupture ([place]) 01/30/2016   Primary cancer of right upper lobe of lung ([place]) 11/24/2015   CKD (chronic kidney disease) stage 3, GFR 30-59 ml/min (HCC) 11/11/2015   Thrombocytopenia ([place]) 02/17/2015   Chronic obstructive airway disease with asthma ([place]) 02/17/2015   Other and unspecified angina pectoris 03/18/2014   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

## 2020-06-27 ENCOUNTER — Other Ambulatory Visit (HOSPITAL_COMMUNITY): Payer: Self-pay | Admitting: Cardiology

## 2020-06-27 ENCOUNTER — Telehealth (HOSPITAL_COMMUNITY): Payer: Self-pay | Admitting: *Deleted

## 2020-06-27 NOTE — Telephone Encounter (Signed)
Pt left VM requesting a return call. I called pt back at [phone] pt did not answer/left vm requesting return call.

## 2020-06-28 DIAGNOSIS — J449 Chronic obstructive pulmonary disease, unspecified: Secondary | ICD-10-CM | POA: Diagnosis not present

## 2020-07-04 ENCOUNTER — Other Ambulatory Visit: Payer: Medicare Other

## 2020-07-04 ENCOUNTER — Other Ambulatory Visit: Payer: Self-pay | Admitting: Pulmonary Disease

## 2020-07-04 DIAGNOSIS — Z2239 Carrier of other specified bacterial diseases: Secondary | ICD-10-CM

## 2020-07-04 DIAGNOSIS — M7502 Adhesive capsulitis of left shoulder: Secondary | ICD-10-CM | POA: Diagnosis not present

## 2020-07-04 NOTE — Progress Notes (Signed)
Order placed per BPM

## 2020-07-07 ENCOUNTER — Other Ambulatory Visit: Payer: Self-pay | Admitting: Pulmonary Disease

## 2020-07-07 ENCOUNTER — Telehealth: Payer: Self-pay | Admitting: Pulmonary Disease

## 2020-07-07 LAB — RESPIRATORY CULTURE OR RESPIRATORY AND SPUTUM CULTURE
MICRO NUMBER:: 11173155
SPECIMEN QUALITY:: ADEQUATE

## 2020-07-07 MED ORDER — LEVOFLOXACIN 500 MG PO TABS: 500.0000 mg | ORAL_TABLET | Freq: Every day | ORAL | 0 refills | Status: DC

## 2020-07-07 NOTE — Telephone Encounter (Signed)
Spoke with pt, checking status of sputum culture results.  I advised that the cultures take some time to grow and get the full report, and advised that we would call him with the results once we have them.  Pt expressed understanding.  Nothing further needed at this time- will close encounter.

## 2020-07-14 ENCOUNTER — Telehealth: Payer: Self-pay | Admitting: Pulmonary Disease

## 2020-07-14 DIAGNOSIS — Z2239 Carrier of other specified bacterial diseases: Secondary | ICD-10-CM

## 2020-07-14 DIAGNOSIS — R059 Cough, unspecified: Secondary | ICD-10-CM

## 2020-07-14 NOTE — Telephone Encounter (Signed)
Called and spoke to pt. Pt is requesting to pick up sputum cups from [place] office. However, there is not a pending sputum order. Pt states he has been told previously that when he is able to produce mucus in the morning he is to bring in a sample. Pt is aware there is no pending order at this time but could still pick up cups for a future occurrence.   Dr. Elsworth Soho, please advise if you would like a standing order for sputum order.   Will forward to [place] to take sputum cups to [place] office in case order is placed.

## 2020-07-14 NOTE — Telephone Encounter (Signed)
Patient came in to [facility] office and 4 sputum cups (labeled with patient name, DOB and MD) given to patient per patient request for 4 cups. Nothing further needed at this time.

## 2020-07-14 NOTE — Telephone Encounter (Signed)
Okay to place order for sputum culture

## 2020-07-14 NOTE — Telephone Encounter (Signed)
Paul Sandoval has already left sputum cup for pick up  I have placed order for culture  Pt aware

## 2020-07-27 DIAGNOSIS — J449 Chronic obstructive pulmonary disease, unspecified: Secondary | ICD-10-CM | POA: Diagnosis not present

## 2020-07-31 ENCOUNTER — Other Ambulatory Visit (HOSPITAL_COMMUNITY): Payer: Self-pay | Admitting: Cardiology

## 2020-07-31 DIAGNOSIS — C3411 Malignant neoplasm of upper lobe, right bronchus or lung: Secondary | ICD-10-CM

## 2020-08-01 DIAGNOSIS — M7502 Adhesive capsulitis of left shoulder: Secondary | ICD-10-CM | POA: Diagnosis not present

## 2020-08-09 ENCOUNTER — Other Ambulatory Visit: Payer: Self-pay

## 2020-08-09 ENCOUNTER — Ambulatory Visit (HOSPITAL_COMMUNITY)
Admission: RE | Admit: 2020-08-09 | Discharge: 2020-08-09 | Disposition: A | Discharge status: Home / Self Care | Payer: Medicare Other | Source: Ambulatory Visit | Attending: Cardiology | Admitting: Cardiology

## 2020-08-09 ENCOUNTER — Encounter (HOSPITAL_COMMUNITY): Payer: Self-pay | Admitting: Cardiology

## 2020-08-09 VITALS — BP 122/66 | HR 75 | Wt 182.0 lb

## 2020-08-09 DIAGNOSIS — I2510 Atherosclerotic heart disease of native coronary artery without angina pectoris: Secondary | ICD-10-CM | POA: Insufficient documentation

## 2020-08-09 DIAGNOSIS — I4510 Unspecified right bundle-branch block: Secondary | ICD-10-CM | POA: Insufficient documentation

## 2020-08-09 DIAGNOSIS — C3490 Malignant neoplasm of unspecified part of unspecified bronchus or lung: Secondary | ICD-10-CM | POA: Diagnosis not present

## 2020-08-09 DIAGNOSIS — Z7902 Long term (current) use of antithrombotics/antiplatelets: Secondary | ICD-10-CM | POA: Insufficient documentation

## 2020-08-09 DIAGNOSIS — I6529 Occlusion and stenosis of unspecified carotid artery: Secondary | ICD-10-CM | POA: Diagnosis not present

## 2020-08-09 DIAGNOSIS — Z79899 Other long term (current) drug therapy: Secondary | ICD-10-CM | POA: Diagnosis not present

## 2020-08-09 DIAGNOSIS — Z8249 Family history of ischemic heart disease and other diseases of the circulatory system: Secondary | ICD-10-CM | POA: Insufficient documentation

## 2020-08-09 DIAGNOSIS — J90 Pleural effusion, not elsewhere classified: Secondary | ICD-10-CM | POA: Diagnosis not present

## 2020-08-09 DIAGNOSIS — Z7951 Long term (current) use of inhaled steroids: Secondary | ICD-10-CM | POA: Insufficient documentation

## 2020-08-09 DIAGNOSIS — Z951 Presence of aortocoronary bypass graft: Secondary | ICD-10-CM | POA: Diagnosis not present

## 2020-08-09 DIAGNOSIS — I130 Hypertensive heart and chronic kidney disease with heart failure and stage 1 through stage 4 chronic kidney disease, or unspecified chronic kidney disease: Secondary | ICD-10-CM | POA: Diagnosis not present

## 2020-08-09 DIAGNOSIS — J449 Chronic obstructive pulmonary disease, unspecified: Secondary | ICD-10-CM | POA: Diagnosis not present

## 2020-08-09 DIAGNOSIS — Z7982 Long term (current) use of aspirin: Secondary | ICD-10-CM | POA: Diagnosis not present

## 2020-08-09 DIAGNOSIS — E785 Hyperlipidemia, unspecified: Secondary | ICD-10-CM | POA: Insufficient documentation

## 2020-08-09 DIAGNOSIS — N183 Chronic kidney disease, stage 3 unspecified: Secondary | ICD-10-CM | POA: Diagnosis not present

## 2020-08-09 DIAGNOSIS — Z87891 Personal history of nicotine dependence: Secondary | ICD-10-CM | POA: Diagnosis not present

## 2020-08-09 DIAGNOSIS — I5042 Chronic combined systolic (congestive) and diastolic (congestive) heart failure: Secondary | ICD-10-CM | POA: Diagnosis not present

## 2020-08-09 DIAGNOSIS — Z7984 Long term (current) use of oral hypoglycemic drugs: Secondary | ICD-10-CM | POA: Diagnosis not present

## 2020-08-09 DIAGNOSIS — I255 Ischemic cardiomyopathy: Secondary | ICD-10-CM | POA: Diagnosis not present

## 2020-08-09 LAB — BASIC METABOLIC PANEL
Anion gap: 10 (ref 5–15)
BUN: 25 mg/dL — ABNORMAL HIGH (ref 8–23)
CO2: 26 mmol/L (ref 22–32)
Calcium: 8.8 mg/dL — ABNORMAL LOW (ref 8.9–10.3)
Chloride: 99 mmol/L (ref 98–111)
Creatinine, Ser: 1.88 mg/dL — ABNORMAL HIGH (ref 0.61–1.24)
GFR, Estimated: 36 mL/min — ABNORMAL LOW (ref >60)
Glucose, Bld: 95 mg/dL (ref 70–99)
Potassium: 4.3 mmol/L (ref 3.5–5.1)
Sodium: 135 mmol/L (ref 135–145)

## 2020-08-09 LAB — MAGNESIUM: Magnesium: 2.2 mg/dL (ref 1.7–2.4)

## 2020-08-09 MED ORDER — CARVEDILOL 25 MG PO TABS
25.0000 mg | ORAL_TABLET | Freq: Two times a day (BID) | ORAL | 4 refills | Status: DC
Start: 2020-08-09 — End: 2020-08-17

## 2020-08-09 NOTE — Progress Notes (Signed)
PCP: Dr. Wolfgang [place] Pulmonary: Dr. Lenna Gilford HF Cardiology: Dr. Aundra Dubin  79 y.o. with history of COPD, CAD s/p CABG in 1996 and PCI to dLM and SVG-D in 7/15, ischemic cardiomyopathy, and non-small cell lung cancer presents for followup of CHF and dyspnea.  He was initially referred to [place] by Dr. Lenna Gilford after fall in EF and worsening dyspnea was noted.   Patient reports that his initial symptoms prior to CABG in 1996 were dyspnea.  He has never had any worrisome chest pain.  He did well initially after CABG and was golfing regularly and walking 2-3 miles/day up until around 3/17.  He had one episode of increased dyspnea in 2015 and had cath with PCI to distal LM and SVG-D.  In 3/17, he was diagnosed with NSCLC and was treated with radiation and chemotherapy.  Since then, he has had progressive exertional dyspnea.  He was noted on echo in 7/18 to have EF down to 20-25%.    RHC/LHC was done in 9/18, showing occluded SVG-small D, patent LIMA-LAD and SVG-PDA, 50% shelf-like stenosis distal left main (no intervention), filling pressure not elevated and cardiac output preserved.   Echo in 8/19 showed that EF remains 30-35%.  Echo in 2/21 showed EF 35%, mildly decreased RV systolic function.   Patient returns for followup of CAD and CHF.  He remains active with yardwork. Main problem has been COPD, had course of prednisone and antibiotics recently for exacerbation.  He has occasional coughing.  He is not short of breath walking on flat ground.  No chest pain.  No orthopnea/PND. Weight is up 2 lbs.   Labs (9/17): LDL 90 Labs (7/18): K 3.5, creatinine 1.28, hgb 10.7 Labs (8/18): LDL 68 Labs (9/18): K 4.1, creatinine 1.2, hgb 11.1, plts 98 K Labs (10/18): K 4.1, creatinine 1.5 Labs (1/19): K 4.3, creatinine 1.54, LDL 80, HDL 49, LFTs normal, TSH normal Labs (4/19): K 4.3, creatinine 1.52, hgb 11.8 Labs (7/19): K 4.1, creatinine 1.5, LDL 75, LFTs normal, hgb 12.2 Labs (10/19): K 4.3, creatinine 1.53 Labs (12/19): K  4.1, creatinine 1.49 Labs (10/20): K 4.4, creatinine 1.67  Labs (1/21): LDL 66 Labs (12/20): K 4.1, creatinine 1.7 Labs (4/21): K 4.1, creatinine 1.71 Labs (5/21): K 4.5, creatinine 1.81 Labs (9/21): K 4.1, creatinine 1.88, LDL 59, HDL 41  PMH: 1. COPD: PFTs (6/17) with severe obstructive airways disease.  2. AAA: s/p repair with stent graft.  3. Non-small cell lung cancer: Stage IIIA, diagnosed 3/17.  He had radiation as well as chemotherapy with carboplatin and paclitaxel.   4. CKD stage 3 5. PNA in 6/17 6. Recurrent right pleural effusion: VATS with talc pleurodesis on right.  No malignant cells noted.  7. HTN 8. H/o THR 9. CAD: CABG 1996 with SVG-D, SVG-PDA, LIMA-LAD.   - LHC (7/15): Totally occluded RCA and LAD.  Severe distal LM stenosis.  Patent SVG-PDA, patent LIMA-LAD.  Severe disease SVG-D.  Patient had DES to distal left main and staged PCI of SVG-D.   - Cardiolite (7/17): EF 30%, prior infarction with no ischemia.  - LHC (9/18): occluded SVG-small D, patent LIMA-LAD and SVG-PDA, 50% shelf-like stenosis distal left main (no intervention).  10. Chronic systolic CHF: Ischemic cardiomyopathy.   - Echo (6/17): EF 30-35%. - Echo (7/18): EF 20-25%, moderate RV dilation with mildly decreased RV systolic function.  - RHC (9/18): mean RA 3, PA 28/9 mean 18, mean PCWP 10, CI 4.05.  - Echo (8/19): EF 30-35%, mildly decreased RV systolic  function.   - Echo (2/21): EF 35%, mildly decreased RV systolic function 11. Carotid stenosis: Carotid dopplers (7/20) with 40-59% BICA stenosis.  - Carotid dopplers (7/21): 40-59% BICA stenosis.   Social History   Socioeconomic History  . Marital status: Married    Spouse name: Not on file  . Number of children: 1  . Years of education: Not on file  . Highest education level: Not on file  Occupational History  . Occupation: Retired    Comment: [employer]  . Smoking status: Former Smoker    Packs/day: 1.50    Years:  30.00    Pack years: 45.00    Types: Cigarettes    Start date: 12/01/1956    Quit date: 01/08/1995    Years since quitting: 25.6  . Smokeless tobacco: Never Used  Vaping Use  . Vaping Use: Never used  Substance and Sexual Activity  . Alcohol use: No    Alcohol/week: 0.0 standard drinks    Comment: 03/18/2014 "no alacohol since 1996"  . Drug use: No  . Sexual activity: Never  Other Topics Concern  . Not on file  Social History Narrative  . Not on file   Social Determinants of Health   Financial Resource Strain: Not on file  Food Insecurity: Not on file  Transportation Needs: Not on file  Physical Activity: Not on file  Stress: Not on file  Social Connections: Not on file  Intimate Partner Violence: Not on file   Family History  Problem Relation Age of Onset  . Heart disease Father   . Cancer Father        Lung  . Arthritis Mother   . Parkinsonism Mother   . Arthritis Sister        Brother with rheumatoid arthritis  . Hypertension Brother   . Colon cancer Neg Hx   . Colon polyps Neg Hx    ROS: All systems reviewed and negative except as per HPI.   Current Outpatient Medications  Medication Sig Dispense Refill  . acetaminophen (TYLENOL) 500 MG tablet Take 500 mg by mouth every 6 (six) hours as needed for mild pain.    Marland Kitchen albuterol (PROAIR HFA) 108 (90 Base) MCG/ACT inhaler INHALE 2 PUFFS BY MOUTH EVERY 4 TO 6 HOURS AS NEEDED FOR WHEEZING. 8.5 g 0  . aspirin EC 81 MG tablet Take 81 mg by mouth daily before breakfast.     . cetirizine (ZYRTEC) 10 MG tablet Take 1 tablet (10 mg total) by mouth at bedtime. 30 tablet 3  . clopidogrel (PLAVIX) 75 MG tablet TAKE (1) TABLET BY MOUTH ONCE DAILY. 90 tablet 0  . dapagliflozin propanediol (FARXIGA) 10 MG TABS tablet Take 10 mg by mouth daily before breakfast. 30 tablet 11  . eplerenone (INSPRA) 50 MG tablet Take 1 tablet (50 mg total) by mouth every evening. 30 tablet 11  . fluticasone (FLONASE) 50 MCG/ACT nasal spray Place 1 spray  into both nostrils daily. 16 g 3  . Fluticasone-Salmeterol (WIXELA INHUB) 100-50 MCG/DOSE AEPB Inhale 1 puff into the lungs 2 (two) times daily.    . furosemide (LASIX) 40 MG tablet Take 1 tablet (40 mg total) by mouth daily. 90 tablet 3  . ipratropium-albuterol (DUONEB) 0.5-2.5 (3) MG/3ML SOLN INHALE 1 VIAL VIA NEBULIZER 4 TIMES DAILY. 360 mL 3  . Multiple Vitamins-Minerals (CENTRUM SILVER ADULT 50+) TABS Take 1 tablet by mouth daily with lunch.     . nitroGLYCERIN (NITROSTAT) 0.4 MG SL tablet  PLACE 1 TAB UNDER TONGUE EVERY 5 MIN IF NEEDED FOR CHEST PAIN. MAY USE 3 TIMES.NO RELIEF CALL 911. 25 tablet 1  . omeprazole (PRILOSEC) 20 MG capsule TAKE ONE CAPSULE BY MOUTH DAILY. 90 capsule 3  . rosuvastatin (CRESTOR) 20 MG tablet TAKE ONE TABLET BY MOUTH ONCE DAILY. 90 tablet 0  . sacubitril-valsartan (ENTRESTO) 49-51 MG TAKE (1) TABLET BY MOUTH TWICE DAILY. 60 tablet 11  . carvedilol (COREG) 25 MG tablet Take 1 tablet (25 mg total) by mouth 2 (two) times daily with a meal. 60 tablet 4   No current facility-administered medications for this encounter.   BP 122/66   Pulse 75   Wt 82.6 kg (182 lb)   SpO2 97%   BMI 25.75 kg/m  General: NAD Neck: No JVD, no thyromegaly or thyroid nodule.  Lungs: Bilateral rhonchi CV: Nondisplaced PMI.  Heart regular S1/S2, no S3/S4, no murmur.  No peripheral edema.  No carotid bruit.  Normal pedal pulses.  Abdomen: Soft, nontender, no hepatosplenomegaly, no distention.  Skin: Intact without lesions or rashes.  Neurologic: Alert and oriented x 3.  Psych: Normal affect. Extremities: No clubbing or cyanosis.  HEENT: Normal.   Assessment/Plan: 1. Chronic systolic CHF: Ischemic cardiomyopathy.  Echo in 7/18 with EF 20-25%, echo 8/19 with EF 30-35%.  Echo in 2/21 showd EF 35%.  Generally, NYHA class II symptoms.  He is not volume overloaded on exam.  I think that his dyspnea is primarily related to  COPD. - Continue Entresto 49/51 bid.  - Continue Lasix 40 mg  daily.  Check BMET today.  - He can increase Coreg to 25 mg bid.   - Continue eplerenone 50 mg daily.   - Continue dapagliflozin 10 mg daily.  - EF persistently low, qualifies for ICD.  He has a RBBB so not good CRT candidate.  He has not been interested in getting an ICD, think this is reasonable given age and lung cancer history.    2. CAD: s/p CABG then PCI 7/15 to distal LM and SVG-D.  Anginal equivalent in the past appears to have been dyspnea, he has never had significant chest pain.  LHC (9/18) showed patent LIMA-LAD and SVG-PDA; occluded SVG-D; 50% shelf-like LM stenosis - Continue ASA 81, Plavix, and Crestor.   3. COPD: Severe COPD by PFTs in 6/17.  Based on the last RHC/LHC, I suspect that COPD plays a major role in his mild ongoing dyspnea.   4. Hyperlipidemia: Good LDL in 9/21.  5. NSCLC: Follows with Dr. Earlie Server.  He has finished chemo/radiation. Cancer appeared quiescent on most recent scans with Dr. Earlie Server.   6. Right pleural effusion: This has been chronic. S/p VATS.  7. Carotid stenosis: Due for repeat dopplers in 7/22.   Followup in 4 months.   Loralie Champagne 08/09/2020

## 2020-08-09 NOTE — Patient Instructions (Signed)
Increase Carvedilol to 25 mg Twice daily  Labs done today, your results will be available in MyChart, we will contact you for abnormal readings.  Your physician recommends that you schedule a follow-up appointment in: 4 Months. Please call our office in March 2022 to schedule an appointment.  If you have any questions or concerns before your next appointment please send [place] a message through [place] or call our office at [phone].    TO LEAVE A MESSAGE FOR THE NURSE SELECT OPTION 2, PLEASE LEAVE A MESSAGE INCLUDING: . YOUR NAME . DATE OF BIRTH . CALL BACK NUMBER . REASON FOR CALL**this is important as we prioritize the call backs  [place] AS LONG AS YOU CALL BEFORE 4:00 PM At the [facility], you and your health needs are our priority. As part of our continuing mission to provide you with exceptional heart care, we have created designated Provider Care Teams. These Care Teams include your primary Cardiologist (physician) and Advanced Practice Providers (APPs- Physician Assistants and Nurse Practitioners) who all work together to provide you with the care you need, when you need it.   You may see any of the following providers on your designated Care Team at your next follow up: Marland Kitchen Dr Glori Bickers . Dr Loralie Champagne . Darrick Grinder, NP . Lyda Jester, PA . Audry Riles, PharmD   Please be sure to bring in all your medications bottles to every appointment.

## 2020-08-15 ENCOUNTER — Other Ambulatory Visit (HOSPITAL_COMMUNITY): Payer: Self-pay | Admitting: Cardiology

## 2020-08-16 ENCOUNTER — Telehealth (HOSPITAL_COMMUNITY): Payer: Self-pay

## 2020-08-16 NOTE — Telephone Encounter (Signed)
Has he been working in the yard lately?  Has he tried OTC hydrocortisone cream on rash twice a day to see if it will resolve? Unlikely that carvedilol would cause a rash just on wrist. If he feels this medication change is what caused it, OK to decrease back to 12.5 mg dosage. If he does decrease the dosage and rash does not resolve, call office and let us know. Thanks!

## 2020-08-16 NOTE — Telephone Encounter (Signed)
Patient called stating that since the increase of his carvedilol from 12.5mg  bid to 25mg  bid on 08/09/20 he has had a rash develop on both wrist. Please advise.

## 2020-08-16 NOTE — Telephone Encounter (Signed)
Denies yard work,skin care changes,has been using hydrocortisone cream.will decrease back to 12.5mg  bid. Will call if no changes in symptoms. Med list updated to reflect changes.

## 2020-08-17 ENCOUNTER — Encounter: Payer: Self-pay | Admitting: Pulmonary Disease

## 2020-08-17 ENCOUNTER — Other Ambulatory Visit (HOSPITAL_COMMUNITY)
Admission: RE | Admit: 2020-08-17 | Discharge: 2020-08-17 | Disposition: A | Discharge status: Home / Self Care | Payer: Medicare Other | Source: Other Acute Inpatient Hospital | Attending: Pulmonary Disease | Admitting: Pulmonary Disease

## 2020-08-17 ENCOUNTER — Ambulatory Visit (INDEPENDENT_AMBULATORY_CARE_PROVIDER_SITE_OTHER): Payer: Medicare Other | Admitting: Pulmonary Disease

## 2020-08-17 ENCOUNTER — Other Ambulatory Visit: Payer: Self-pay

## 2020-08-17 DIAGNOSIS — C3411 Malignant neoplasm of upper lobe, right bronchus or lung: Secondary | ICD-10-CM

## 2020-08-17 DIAGNOSIS — J449 Chronic obstructive pulmonary disease, unspecified: Secondary | ICD-10-CM

## 2020-08-17 DIAGNOSIS — Z2239 Carrier of other specified bacterial diseases: Secondary | ICD-10-CM

## 2020-08-17 MED ORDER — CARVEDILOL 25 MG PO TABS: 12.5000 mg | ORAL_TABLET | Freq: Two times a day (BID) | ORAL | 3 refills | Status: DC

## 2020-08-17 NOTE — Addendum Note (Signed)
Addended by: Merrilee Seashore on: 08/17/2020 11:58 AM   Modules accepted: Orders

## 2020-08-17 NOTE — Assessment & Plan Note (Signed)
68-month surveillance CT per oncology

## 2020-08-17 NOTE — Assessment & Plan Note (Signed)
He has tolerated the change from Symbicort to [facility]. I clarified the need for maintenance medication. He is still taking duo nebs 3-4 times a day I have asked him to try and cut down to twice daily and then use more if needed.

## 2020-08-17 NOTE — Progress Notes (Signed)
   Subjective:    Patient ID: Paul Sandoval., male    DOB: Nov 22, 1940, 79 y.o.   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 Follow-up    Productive cough with yellow/green phlegm yesterday and this morning   [facility] changed his Symbicort to generic Advair -his breathing is unchanged. 08/09/2020 cardiology increase his Coreg to 25 mg twice a day. He developed an itchy rash on both forearms, applied hydrocortisone and this is helped, Coreg was decreased back to 12.5. He wonders if W[place]ela could be causing the rash  11/8 sputum culture showed Serratia sensitive to Cipro resistant to other oral antibiotics  He was started on cetirizine for nasal congestion and he wonders if he needs to take this every day. Overall breathing has been stable no excessive coughing however this morning he coughed up a little bit of green sputum and he brings an a cup  Significant tests/ events reviewed  05/2020 CT chest 05/2020 Unchanged post treatment appearance of the chest with dense fibrotic consolidation and volume loss of the paramedian right upper lobe and perihilar right lung. Small, chronic, loculated right pleural effusion. Unchanged prominent subcarinal lymph node  11/2019 CT chest no evidence of recurrence, right upper lobe fibrosis stable, chronic loculated right pleural effusion  05/2019 CT chestStable appearance of mildly enlarged subcarinal lymph node. Stable loculated effusions post talc pleurodesis and paramediastinal and right upper lobe fibrosis post radiation  04/09/2018-echocardiogram-LV ejection fraction 25 to 30%, diffuse hypokinesis,  02/01/2016-pulmonary function test-FVC 1.96  (45% predicted), postbronchodilator ratio of 67, postbronchodilator FEV1 1.64 (53% predicted, postbronchodilator response, significant mid flow reversibility, DLCO 58  Review of Systems neg for any significant sore throat, dysphagia, itching, sneezing, nasal congestion or excess/ purulent secretions, fever, chills, sweats, unintended wt loss, pleuritic or exertional cp, hempoptysis, orthopnea pnd or change in chronic leg swelling. Also denies presyncope, palpitations, heartburn, abdominal pain, nausea, vomiting, diarrhea or change in bowel or urinary habits, dysuria,hematuria, rash, arthralgias, visual complaints, headache, numbness weakness or ataxia.     Objective:   Physical Exam  Gen. Pleasant, well-nourished, in no distress ENT - no thrush, no pallor/icterus,no post nasal drip Neck: No JVD, no thyromegaly, no carotid bruits Lungs: no use of accessory muscles, no dullness to percussion, decreased without rales or rhonchi  Cardiovascular: Rhythm regular, heart sounds  normal, no murmurs or gallops, no peripheral edema Musculoskeletal: No deformities, no cyanosis or clubbing  Skin-red papular rash in both forearms, minimal      Assessment & Plan:

## 2020-08-17 NOTE — Assessment & Plan Note (Signed)
He does not seem to be having an active exacerbation but did have cough with green sputum so we will culture this again. 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 If he ever develops COPD exacerbation he will need Levaquin

## 2020-08-17 NOTE — Patient Instructions (Signed)
Sputum culture Take cetirizine only during spring & fall Continue on [place]

## 2020-08-17 NOTE — Addendum Note (Signed)
Addended by: Shela Nevin R on: 98/05/2547 09:58 AM   Modules accepted: Orders

## 2020-08-19 LAB — CULTURE, RESPIRATORY W GRAM STAIN

## 2020-08-22 NOTE — Progress Notes (Signed)
Called and went over sputum results per Dr Elsworth Soho with patient. All questions answered and patient expressed full understanding and aware to call if he develops increased cough or wheezing per Dr Elsworth Soho.

## 2020-08-23 ENCOUNTER — Telehealth: Payer: Self-pay | Admitting: Pulmonary Disease

## 2020-08-23 NOTE — Telephone Encounter (Signed)
Spoke with patient. He stated that he was looking at his Flonase and [place] and noticed that they both contained the same medication. He was concerned about using too much fluticasone. He stated that when he was first prescribed the Flonase, he was told to use it everyday. When he saw RA a few weeks ago, he was advised to use it as needed.   I advised him that it was safe to use the Flonase as needed but to continue to use the [facility] each day as this is his maintenance inhaler. He verbalized understanding.   Nothing further needed at time of call.

## 2020-08-29 DIAGNOSIS — J449 Chronic obstructive pulmonary disease, unspecified: Secondary | ICD-10-CM | POA: Diagnosis not present

## 2020-09-22 ENCOUNTER — Other Ambulatory Visit (HOSPITAL_COMMUNITY): Payer: Self-pay | Admitting: Cardiology

## 2020-09-26 ENCOUNTER — Ambulatory Visit: Payer: Medicare Other | Admitting: Family Medicine

## 2020-10-03 DIAGNOSIS — J449 Chronic obstructive pulmonary disease, unspecified: Secondary | ICD-10-CM | POA: Diagnosis not present

## 2020-10-25 ENCOUNTER — Ambulatory Visit: Payer: Medicare Other | Admitting: Pulmonary Disease

## 2020-10-26 DIAGNOSIS — J449 Chronic obstructive pulmonary disease, unspecified: Secondary | ICD-10-CM | POA: Diagnosis not present

## 2020-11-08 ENCOUNTER — Telehealth: Payer: Self-pay | Admitting: Pulmonary Disease

## 2020-11-08 ENCOUNTER — Other Ambulatory Visit (HOSPITAL_COMMUNITY): Payer: Self-pay | Admitting: Cardiology

## 2020-11-08 NOTE — Telephone Encounter (Signed)
Spoke with pt's wife who stated pt has broken out in rash twice while taking Zrytec. Wyn Quaker recommended that pt stop taking Zrytec and replace with Claritin or Xyzal. Pt's wife stated understanding. Nothing further needed at this time.

## 2020-11-13 ENCOUNTER — Other Ambulatory Visit (HOSPITAL_COMMUNITY): Payer: Self-pay | Admitting: Adult Health

## 2020-11-25 DIAGNOSIS — J449 Chronic obstructive pulmonary disease, unspecified: Secondary | ICD-10-CM | POA: Diagnosis not present

## 2020-12-01 DIAGNOSIS — Z23 Encounter for immunization: Secondary | ICD-10-CM | POA: Diagnosis not present

## 2020-12-05 ENCOUNTER — Ambulatory Visit (INDEPENDENT_AMBULATORY_CARE_PROVIDER_SITE_OTHER): Payer: Medicare Other | Admitting: Pulmonary Disease

## 2020-12-05 ENCOUNTER — Other Ambulatory Visit: Payer: Self-pay

## 2020-12-05 ENCOUNTER — Encounter: Payer: Self-pay | Admitting: Pulmonary Disease

## 2020-12-05 VITALS — BP 128/66 | HR 78 | Temp 97.0°F | Ht 70.0 in | Wt 185.2 lb

## 2020-12-05 DIAGNOSIS — J449 Chronic obstructive pulmonary disease, unspecified: Secondary | ICD-10-CM | POA: Diagnosis not present

## 2020-12-05 DIAGNOSIS — Z2239 Carrier of other specified bacterial diseases: Secondary | ICD-10-CM | POA: Diagnosis not present

## 2020-12-05 NOTE — Patient Instructions (Signed)
OK to take claritin 10 mg daily Your lungs are colonised with a bad bug called Serratia We will treat only if increased sputum, yellow/ green color  OK to take mucinex as needed Rx for flutter valve

## 2020-12-05 NOTE — Assessment & Plan Note (Addendum)
Appears to be colonized with Serratia. We will only treat if he has excessive sputum production or change in color sputum -We will likely need quinolones, in that event. Emphasized airway clearance with flutter valve and Mucinex as needed

## 2020-12-05 NOTE — Assessment & Plan Note (Signed)
He prefers generic Advair and does not want to start triple therapy. He will take duo nebs on an as-needed basis

## 2020-12-05 NOTE — Progress Notes (Signed)
   Subjective:    Patient ID: Paul Ewing., male    DOB: 06-26-1941, 80 y.o.   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 Small, chronic, loculated right pleural effusion. Unchanged prominent subcarinal lymph node  11/2019 CT chestno evidence of recurrence, right upper lobe fibrosis stable, chronic loculated right pleural effusion  05/2019 CT chestStable appearance of mildly enlarged subcarinal lymph node. Stable loculated effusions post talc pleurodesis and paramediastinal and right upper lobe fibrosis post radiation  04/09/2018-echocardiogram-LV ejection fraction 25 to 30%, diffuse hypokinesis,  02/01/2016-pulmonary function test-FVC 1.96 (45% predicted), postbronchodilator ratio of 67, postbronchodilator FEV1 1.64 (53% predicted, postbronchodilator response, significant mid flow reversibility, DLCO 58  Review of Systems neg for any significant sore throat, dysphagia, itching, sneezing, nasal congestion or excess/ purulent secretions, fever, chills, sweats, unintended wt loss, pleuritic or exertional cp, hempoptysis, orthopnea pnd or change in chronic leg swelling. Also denies presyncope, palpitations, heartburn, abdominal pain, nausea, vomiting, diarrhea or change in bowel or urinary habits,  dysuria,hematuria, rash, arthralgias, visual complaints, headache, numbness weakness or ataxia.     Objective:   Physical Exam  Gen. Pleasant, elderly, well-nourished, in no distress ENT - no thrush, no pallor/icterus,no post nasal drip Neck: No JVD, no thyromegaly, no carotid bruits Lungs: no use of accessory muscles, no dullness to percussion, clear without rales or rhonchi  Cardiovascular: Rhythm regular, heart sounds  normal, no murmurs or gallops, no peripheral edema Musculoskeletal: No deformities, no cyanosis or clubbing        Assessment & Plan:

## 2020-12-07 ENCOUNTER — Telehealth: Payer: Self-pay | Admitting: Pulmonary Disease

## 2020-12-07 DIAGNOSIS — R059 Cough, unspecified: Secondary | ICD-10-CM

## 2020-12-07 NOTE — Telephone Encounter (Signed)
Dr. Elsworth Soho just saw patient on 4/11, per note he did not want to treat resp infection with abx unless patient was having excessive sputum production or change in color of sputum.   Continue to take tylenol 650mg  every 4-6 hours for fever/chills. Stay well hydrated. Continue Advair and use duoneb three times a day. He can get a chest xray tomorrow if he'd like to ensure there is not a pneumonia. NO openings for visit.  Please send response back to Dr. Elsworth Soho, he will be available tomorrow

## 2020-12-07 NOTE — Telephone Encounter (Signed)
Called and spoke with patient. States she wasn't feel well yesterday. Took his temp it was 99.5, states he took some Tylenol. Took temperature again a few hours later, it was up to 101.6. Patient states he has been taking Tylenol ATC. Reports his temperature was 99.5 about 15 minutes ago, he took some more Tylenol.   Patient is reports a little SHOB. Denies any pain. States he has been using his nebulizer and still is not able to cough anything up.   Beth please advise  Thanks

## 2020-12-07 NOTE — Telephone Encounter (Signed)
Called and spoke with patient. Went over [place] [employer]. Patient does want to come by the office tomorrow for Chest X-ray. Order has been out in.  RV this is an FYI for you regarding the Chest X-ray.  Nothing further needed at this time.

## 2020-12-08 ENCOUNTER — Other Ambulatory Visit: Payer: Self-pay

## 2020-12-08 ENCOUNTER — Ambulatory Visit (INDEPENDENT_AMBULATORY_CARE_PROVIDER_SITE_OTHER): Payer: Medicare Other | Admitting: Pulmonary Disease

## 2020-12-08 ENCOUNTER — Other Ambulatory Visit (HOSPITAL_COMMUNITY)
Admission: RE | Admit: 2020-12-08 | Discharge: 2020-12-08 | Disposition: A | Discharge status: Home / Self Care | Payer: Medicare Other | Source: Ambulatory Visit | Attending: Pulmonary Disease | Admitting: Pulmonary Disease

## 2020-12-08 ENCOUNTER — Encounter: Payer: Self-pay | Admitting: Pulmonary Disease

## 2020-12-08 ENCOUNTER — Ambulatory Visit (INDEPENDENT_AMBULATORY_CARE_PROVIDER_SITE_OTHER): Payer: Medicare Other

## 2020-12-08 VITALS — BP 126/62 | HR 92 | Temp 97.5°F | Ht 70.0 in | Wt 185.0 lb

## 2020-12-08 DIAGNOSIS — R059 Cough, unspecified: Secondary | ICD-10-CM | POA: Diagnosis not present

## 2020-12-08 DIAGNOSIS — R509 Fever, unspecified: Secondary | ICD-10-CM

## 2020-12-08 DIAGNOSIS — Z2239 Carrier of other specified bacterial diseases: Secondary | ICD-10-CM | POA: Diagnosis not present

## 2020-12-08 DIAGNOSIS — J90 Pleural effusion, not elsewhere classified: Secondary | ICD-10-CM | POA: Diagnosis not present

## 2020-12-08 LAB — CBC WITH DIFFERENTIAL/PLATELET
Abs Immature Granulocytes: 0.1 10*3/uL — ABNORMAL HIGH (ref 0.00–0.07)
Basophils Absolute: 0 10*3/uL (ref 0.0–0.1)
Basophils Relative: 0 %
Eosinophils Absolute: 0 10*3/uL (ref 0.0–0.5)
Eosinophils Relative: 0 %
HCT: 33.2 % — ABNORMAL LOW (ref 39.0–52.0)
Hemoglobin: 10.6 g/dL — ABNORMAL LOW (ref 13.0–17.0)
Immature Granulocytes: 1 %
Lymphocytes Relative: 7 %
Lymphs Abs: 1.1 10*3/uL (ref 0.7–4.0)
MCH: 29.2 pg (ref 26.0–34.0)
MCHC: 31.9 g/dL (ref 30.0–36.0)
MCV: 91.5 fL (ref 80.0–100.0)
Monocytes Absolute: 0.7 10*3/uL (ref 0.1–1.0)
Monocytes Relative: 5 %
Neutro Abs: 13 10*3/uL — ABNORMAL HIGH (ref 1.7–7.7)
Neutrophils Relative %: 87 %
Platelets: 130 10*3/uL — ABNORMAL LOW (ref 150–400)
RBC: 3.63 MIL/uL — ABNORMAL LOW (ref 4.22–5.81)
RDW: 14.8 % (ref 11.5–15.5)
WBC: 14.9 10*3/uL — ABNORMAL HIGH (ref 4.0–10.5)
nRBC: 0 % (ref 0.0–0.2)

## 2020-12-08 LAB — URINALYSIS, ROUTINE W REFLEX MICROSCOPIC
Bilirubin Urine: NEGATIVE
Glucose, UA: 50 mg/dL — AB
Hgb urine dipstick: NEGATIVE
Ketones, ur: NEGATIVE mg/dL
Leukocytes,Ua: NEGATIVE
Nitrite: NEGATIVE
Protein, ur: NEGATIVE mg/dL
Specific Gravity, Urine: 1.011 (ref 1.005–1.030)
pH: 5 (ref 5.0–8.0)

## 2020-12-08 LAB — BASIC METABOLIC PANEL
Anion gap: 12 (ref 5–15)
BUN: 31 mg/dL — ABNORMAL HIGH (ref 8–23)
CO2: 23 mmol/L (ref 22–32)
Calcium: 8.6 mg/dL — ABNORMAL LOW (ref 8.9–10.3)
Chloride: 97 mmol/L — ABNORMAL LOW (ref 98–111)
Creatinine, Ser: 2.32 mg/dL — ABNORMAL HIGH (ref 0.61–1.24)
GFR, Estimated: 28 mL/min — ABNORMAL LOW (ref >60)
Glucose, Bld: 111 mg/dL — ABNORMAL HIGH (ref 70–99)
Potassium: 4.5 mmol/L (ref 3.5–5.1)
Sodium: 132 mmol/L — ABNORMAL LOW (ref 135–145)

## 2020-12-08 MED ORDER — LEVOFLOXACIN 500 MG PO TABS: ORAL_TABLET | ORAL | 0 refills | Status: DC

## 2020-12-08 MED ORDER — LEVOFLOXACIN 500 MG PO TABS: 500.0000 mg | ORAL_TABLET | Freq: Every day | ORAL | 0 refills | Status: DC

## 2020-12-08 NOTE — Telephone Encounter (Signed)
Pt seen today

## 2020-12-08 NOTE — Assessment & Plan Note (Signed)
Since he is known to have Serratia in his respiratory tract, I will proceed with treatment with Levaquin for 10 days

## 2020-12-08 NOTE — Progress Notes (Signed)
Called and went over lab results and recommendations per Dr Elsworth Soho with patient. All questions answered and patient expressed full understanding of results and recommendations with teach back. Order placed for BMET to be drawn in 10 days, patient aware and expressed understanding. Nothing further needed at this time.

## 2020-12-08 NOTE — Patient Instructions (Addendum)
Levaquin 500 mg daily x 10 days   CBC/ BMET / UA/ urine culture  today

## 2020-12-08 NOTE — Assessment & Plan Note (Signed)
Cause is unclear after today's evaluation.  We perform rapid antigen Covid testing which he had brought with him which was negative he does not have any sick contacts. Chest x-ray does not show any evidence of pneumonia and is unchanged from his baseline. We will obtain blood work including CBC and a UA to see if he has signs of systemic infection or UTI

## 2020-12-08 NOTE — Progress Notes (Signed)
   Subjective:    Patient ID: Paul Ewing., male    DOB: August 08, 1941, 80 y.o.   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 Took his temp it was 99.5, states he took some Tylenol. Took temperature again a few hours later, it was up to 101.6. Patient states he has been taking Tylenol ATC. He called the office and visit was scheduled after chest x-ray He denies worsening cough, productive sputum, burning micturition.  He complains of nocturia  CXR - 4/14 independently reviewed -  Unchanged post treatment appearance of the chest with dense fibrotic consolidation and volume loss of the paramedian right upper lobe and perihilar right lung. Small, chronic, loculated right pleural effusion with pleural calcification, perhaps related to prior talc pleurodesis   Significant tests/ events reviewed  05/2020 CT chest 10/2021Unchanged post treatment appearance of the chest with dense fibrotic consolidation and volume loss of the paramedian right upper lobe and perihilar right lung. Small, chronic, loculated right pleural effusion. Unchanged prominent subcarinal lymph node  11/2019 CT chestno evidence of recurrence, right upper lobe fibrosis stable, chronic loculated right pleural effusion  05/2019 CT chestStable appearance of mildly enlarged subcarinal lymph node. Stable loculated effusions post talc pleurodesis and paramediastinal and right upper lobe fibrosis post radiation  04/09/2018-echocardiogram-LV ejection fraction 25 to 30%, diffuse hypokinesis,  02/01/2016-pulmonary function test-FVC 1.96 (45% predicted), postbronchodilator  ratio of 67, postbronchodilator FEV1 1.64 (53% predicted, postbronchodilator response, significant mid flow reversibility, DLCO 58  Review of Systems neg for any significant sore throat, dysphagia, itching, sneezing, nasal congestion or excess/ purulent secretions, fever, chills, sweats, unintended wt loss, pleuritic or exertional cp, hempoptysis, orthopnea pnd or change in chronic leg swelling. Also denies presyncope, palpitations, heartburn, abdominal pain, nausea, vomiting, diarrhea or change in bowel or urinary habits, dysuria,hematuria, rash, arthralgias, visual complaints, headache, numbness weakness or ataxia.     Objective:   Physical Exam  Gen. Pleasant, well-nourished, elderly in no distress ENT - no thrush, no pallor/icterus,no post nasal drip Neck: No JVD, no thyromegaly, no carotid bruits Lungs: no use of accessory muscles, no dullness to percussion, clear without rales or rhonchi  Cardiovascular: Rhythm regular, heart sounds  normal, no murmurs or gallops, no peripheral edema Musculoskeletal: No deformities, no cyanosis or clubbing        Assessment & Plan:

## 2020-12-09 ENCOUNTER — Telehealth (HOSPITAL_COMMUNITY): Payer: Self-pay | Admitting: Surgery

## 2020-12-09 LAB — URINE CULTURE: Culture: NO GROWTH

## 2020-12-09 MED ORDER — FUROSEMIDE 40 MG PO TABS: 20.0000 mg | ORAL_TABLET | Freq: Every day | ORAL | 3 refills | Status: DC

## 2020-12-09 NOTE — Telephone Encounter (Signed)
-----   Message from Larey Dresser, MD sent at 12/08/2020  5:54 PM EDT ----- Agree with hold Lasix x 3 days then decrease to 20 mg daily.  BMET 10 days.

## 2020-12-09 NOTE — Telephone Encounter (Signed)
I called and spoke to Paul Sandoval regarding his results and  recommendations from Dr. Aundra Dubin to hold his Lasix for 3 days then to resume at lower dose of 20 mg daily.   He does complain of a new low grade fever as well as some SOB and wheezing.  I asked that he call his pulmonologist or even seek emergency care regarding these symptoms if they do not improve.  He acknowledges understanding.

## 2020-12-12 ENCOUNTER — Inpatient Hospital Stay: Payer: Medicare Other | Attending: Internal Medicine

## 2020-12-12 ENCOUNTER — Ambulatory Visit (HOSPITAL_COMMUNITY)
Admission: RE | Admit: 2020-12-12 | Discharge: 2020-12-12 | Disposition: A | Discharge status: Home / Self Care | Payer: Medicare Other | Source: Ambulatory Visit | Attending: Internal Medicine | Admitting: Internal Medicine

## 2020-12-12 ENCOUNTER — Other Ambulatory Visit: Payer: Self-pay

## 2020-12-12 DIAGNOSIS — J449 Chronic obstructive pulmonary disease, unspecified: Secondary | ICD-10-CM | POA: Diagnosis not present

## 2020-12-12 DIAGNOSIS — B370 Candidal stomatitis: Secondary | ICD-10-CM | POA: Insufficient documentation

## 2020-12-12 DIAGNOSIS — Z8719 Personal history of other diseases of the digestive system: Secondary | ICD-10-CM | POA: Diagnosis not present

## 2020-12-12 DIAGNOSIS — I2510 Atherosclerotic heart disease of native coronary artery without angina pectoris: Secondary | ICD-10-CM | POA: Insufficient documentation

## 2020-12-12 DIAGNOSIS — R590 Localized enlarged lymph nodes: Secondary | ICD-10-CM | POA: Diagnosis not present

## 2020-12-12 DIAGNOSIS — I252 Old myocardial infarction: Secondary | ICD-10-CM | POA: Diagnosis not present

## 2020-12-12 DIAGNOSIS — C3411 Malignant neoplasm of upper lobe, right bronchus or lung: Secondary | ICD-10-CM | POA: Diagnosis not present

## 2020-12-12 DIAGNOSIS — Z79899 Other long term (current) drug therapy: Secondary | ICD-10-CM | POA: Diagnosis not present

## 2020-12-12 DIAGNOSIS — R918 Other nonspecific abnormal finding of lung field: Secondary | ICD-10-CM | POA: Diagnosis not present

## 2020-12-12 DIAGNOSIS — C3490 Malignant neoplasm of unspecified part of unspecified bronchus or lung: Secondary | ICD-10-CM | POA: Insufficient documentation

## 2020-12-12 DIAGNOSIS — I509 Heart failure, unspecified: Secondary | ICD-10-CM | POA: Insufficient documentation

## 2020-12-12 DIAGNOSIS — Z9049 Acquired absence of other specified parts of digestive tract: Secondary | ICD-10-CM | POA: Insufficient documentation

## 2020-12-12 DIAGNOSIS — K219 Gastro-esophageal reflux disease without esophagitis: Secondary | ICD-10-CM | POA: Insufficient documentation

## 2020-12-12 DIAGNOSIS — I700 Atherosclerosis of aorta: Secondary | ICD-10-CM | POA: Diagnosis not present

## 2020-12-12 LAB — CBC WITH DIFFERENTIAL (CANCER CENTER ONLY)
Abs Immature Granulocytes: 0.04 10*3/uL (ref 0.00–0.07)
Basophils Absolute: 0 10*3/uL (ref 0.0–0.1)
Basophils Relative: 0 %
Eosinophils Absolute: 0 10*3/uL (ref 0.0–0.5)
Eosinophils Relative: 0 %
HCT: 30.1 % — ABNORMAL LOW (ref 39.0–52.0)
Hemoglobin: 9.7 g/dL — ABNORMAL LOW (ref 13.0–17.0)
Immature Granulocytes: 1 %
Lymphocytes Relative: 9 %
Lymphs Abs: 0.7 10*3/uL (ref 0.7–4.0)
MCH: 28 pg (ref 26.0–34.0)
MCHC: 32.2 g/dL (ref 30.0–36.0)
MCV: 86.7 fL (ref 80.0–100.0)
Monocytes Absolute: 0.7 10*3/uL (ref 0.1–1.0)
Monocytes Relative: 10 %
Neutro Abs: 5.8 10*3/uL (ref 1.7–7.7)
Neutrophils Relative %: 80 %
Platelet Count: 160 10*3/uL (ref 150–400)
RBC: 3.47 MIL/uL — ABNORMAL LOW (ref 4.22–5.81)
RDW: 15.3 % (ref 11.5–15.5)
WBC Count: 7.3 10*3/uL (ref 4.0–10.5)
nRBC: 0 % (ref 0.0–0.2)

## 2020-12-12 LAB — CMP (CANCER CENTER ONLY)
ALT: 21 U/L (ref 0–44)
AST: 26 U/L (ref 15–41)
Albumin: 2.8 g/dL — ABNORMAL LOW (ref 3.5–5.0)
Alkaline Phosphatase: 135 U/L — ABNORMAL HIGH (ref 38–126)
Anion gap: 11 (ref 5–15)
BUN: 35 mg/dL — ABNORMAL HIGH (ref 8–23)
CO2: 24 mmol/L (ref 22–32)
Calcium: 9.2 mg/dL (ref 8.9–10.3)
Chloride: 102 mmol/L (ref 98–111)
Creatinine: 2.21 mg/dL — ABNORMAL HIGH (ref 0.61–1.24)
GFR, Estimated: 29 mL/min — ABNORMAL LOW (ref >60)
Glucose, Bld: 102 mg/dL — ABNORMAL HIGH (ref 70–99)
Potassium: 4.4 mmol/L (ref 3.5–5.1)
Sodium: 137 mmol/L (ref 135–145)
Total Bilirubin: 0.4 mg/dL (ref 0.3–1.2)
Total Protein: 7.1 g/dL (ref 6.5–8.1)

## 2020-12-13 ENCOUNTER — Encounter (HOSPITAL_COMMUNITY): Payer: Self-pay | Admitting: Cardiology

## 2020-12-13 ENCOUNTER — Ambulatory Visit (HOSPITAL_COMMUNITY)
Admission: RE | Admit: 2020-12-13 | Discharge: 2020-12-13 | Disposition: A | Discharge status: Home / Self Care | Payer: Medicare Other | Source: Ambulatory Visit | Attending: Cardiology | Admitting: Cardiology

## 2020-12-13 VITALS — BP 112/62 | HR 86 | Wt 187.8 lb

## 2020-12-13 DIAGNOSIS — Z7982 Long term (current) use of aspirin: Secondary | ICD-10-CM | POA: Insufficient documentation

## 2020-12-13 DIAGNOSIS — N183 Chronic kidney disease, stage 3 unspecified: Secondary | ICD-10-CM | POA: Insufficient documentation

## 2020-12-13 DIAGNOSIS — Z87891 Personal history of nicotine dependence: Secondary | ICD-10-CM | POA: Insufficient documentation

## 2020-12-13 DIAGNOSIS — Z85118 Personal history of other malignant neoplasm of bronchus and lung: Secondary | ICD-10-CM | POA: Diagnosis not present

## 2020-12-13 DIAGNOSIS — Z7951 Long term (current) use of inhaled steroids: Secondary | ICD-10-CM | POA: Diagnosis not present

## 2020-12-13 DIAGNOSIS — Z79899 Other long term (current) drug therapy: Secondary | ICD-10-CM | POA: Insufficient documentation

## 2020-12-13 DIAGNOSIS — Z7984 Long term (current) use of oral hypoglycemic drugs: Secondary | ICD-10-CM | POA: Insufficient documentation

## 2020-12-13 DIAGNOSIS — I255 Ischemic cardiomyopathy: Secondary | ICD-10-CM | POA: Diagnosis not present

## 2020-12-13 DIAGNOSIS — R0602 Shortness of breath: Secondary | ICD-10-CM | POA: Insufficient documentation

## 2020-12-13 DIAGNOSIS — J449 Chronic obstructive pulmonary disease, unspecified: Secondary | ICD-10-CM | POA: Diagnosis not present

## 2020-12-13 DIAGNOSIS — E785 Hyperlipidemia, unspecified: Secondary | ICD-10-CM | POA: Insufficient documentation

## 2020-12-13 DIAGNOSIS — I5042 Chronic combined systolic (congestive) and diastolic (congestive) heart failure: Secondary | ICD-10-CM | POA: Insufficient documentation

## 2020-12-13 DIAGNOSIS — I130 Hypertensive heart and chronic kidney disease with heart failure and stage 1 through stage 4 chronic kidney disease, or unspecified chronic kidney disease: Secondary | ICD-10-CM | POA: Insufficient documentation

## 2020-12-13 DIAGNOSIS — Z8249 Family history of ischemic heart disease and other diseases of the circulatory system: Secondary | ICD-10-CM | POA: Insufficient documentation

## 2020-12-13 DIAGNOSIS — Z7902 Long term (current) use of antithrombotics/antiplatelets: Secondary | ICD-10-CM | POA: Insufficient documentation

## 2020-12-13 DIAGNOSIS — I2510 Atherosclerotic heart disease of native coronary artery without angina pectoris: Secondary | ICD-10-CM | POA: Diagnosis not present

## 2020-12-13 DIAGNOSIS — R059 Cough, unspecified: Secondary | ICD-10-CM | POA: Insufficient documentation

## 2020-12-13 DIAGNOSIS — I4510 Unspecified right bundle-branch block: Secondary | ICD-10-CM | POA: Insufficient documentation

## 2020-12-13 DIAGNOSIS — J90 Pleural effusion, not elsewhere classified: Secondary | ICD-10-CM | POA: Insufficient documentation

## 2020-12-13 HISTORY — DX: Heart failure, unspecified: I50.9

## 2020-12-13 MED ORDER — FUROSEMIDE 20 MG PO TABS
20.0000 mg | ORAL_TABLET | ORAL | 3 refills | Status: DC
Start: 2020-12-13 — End: 2020-12-25

## 2020-12-13 MED ORDER — CARVEDILOL 12.5 MG PO TABS: 12.5000 mg | ORAL_TABLET | Freq: Two times a day (BID) | ORAL | 3 refills | Status: DC

## 2020-12-13 NOTE — Patient Instructions (Addendum)
EKG done today.  No Labs done today.   DECREASE Carvedilol (Coreg) to 12.5mg  (1 tablet) by mouth 2 times daily.  DECREASE Lasix to 20mg  (1 tablet) by mouth every other day.  No other medication changes were made. Please continue all current medications as prescribed.  Your physician recommends that you schedule a follow-up appointment in: 10 days for a lab only appointment,soon for an echo and in 3-4 weeks with our [facility] here in our office.  Your physician has requested that you have an echocardiogram. Echocardiography is a painless test that uses sound waves to create images of your heart. It provides your doctor with information about the size and shape of your heart and how well your heart's chambers and valves are working. This procedure takes approximately one hour. There are no restrictions for this procedure.  If you have any questions or concerns before your next appointment please send [place] a message through [place] or call our office at [phone].    TO LEAVE A MESSAGE FOR THE NURSE SELECT OPTION 2, PLEASE LEAVE A MESSAGE INCLUDING: . YOUR NAME . DATE OF BIRTH . CALL BACK NUMBER . REASON FOR CALL**this is important as we prioritize the call backs  YOU WILL RECEIVE A CALL BACK THE SAME DAY AS LONG AS YOU CALL BEFORE 4:00 PM   Do the following things EVERYDAY: 1) Weigh yourself in the morning before breakfast. Write it down and keep it in a log. 2) Take your medicines as prescribed 3) Eat low salt foods--Limit salt (sodium) to 2000 mg per day.  4) Stay as active as you can everyday 5) Limit all fluids for the day to less than 2 liters   At the [facility], you and your health needs are our priority. As part of our continuing mission to provide you with exceptional heart care, we have created designated Provider Care Teams. These Care Teams include your primary Cardiologist (physician) and Advanced Practice Providers (APPs- Physician Assistants and  Nurse Practitioners) who all work together to provide you with the care you need, when you need it.   You may see any of the following providers on your designated Care Team at your next follow up: Marland Kitchen Dr Glori Bickers . Dr Loralie Champagne . Darrick Grinder, NP . Lyda Jester, PA . Audry Riles, PharmD   Please be sure to bring in all your medications bottles to every appointment.

## 2020-12-13 NOTE — Progress Notes (Signed)
PCP: Dr. Wolfgang Phoenix HF Cardiology: Dr. Aundra Dubin  80 y.o. with history of COPD, CAD s/p CABG in 1996 and PCI to dLM and SVG-D in 7/15, ischemic cardiomyopathy, and non-small cell lung cancer presents for followup of CHF and dyspnea.  He was initially referred to [place] by Dr. Lenna Gilford after fall in EF and worsening dyspnea was noted.   Patient reports that his initial symptoms prior to CABG in 1996 were dyspnea.  He has never had any worrisome chest pain.  He did well initially after CABG and was golfing regularly and walking 2-3 miles/day up until around 3/17.  He had one episode of increased dyspnea in 2015 and had cath with PCI to distal LM and SVG-D.  In 3/17, he was diagnosed with NSCLC and was treated with radiation and chemotherapy.  Since then, he has had progressive exertional dyspnea.  He was noted on echo in 7/18 to have EF down to 20-25%.    RHC/LHC was done in 9/18, showing occluded SVG-small D, patent LIMA-LAD and SVG-PDA, 50% shelf-like stenosis distal left main (no intervention), filling pressure not elevated and cardiac output preserved.   Echo in 8/19 showed that EF remains 30-35%.  Echo in 2/21 showed EF 35%, mildly decreased RV systolic function.   Patient returns for followup of CAD and CHF.  For over a week, he has had coughing productive of yellow sputum and wheezing.  COVID-19 testing was negative.  He is on levofloxacin for COPD exacerbation.  He feels like his stamina is poor.  He is short of breath walking around in stores (this is worse than in the past).  No chest pain.  No orthopnea/PND.  No palpitations, he is in NSR today.  Creatinine has been elevated above baseline recently. Of note, he has been getting lightheaded with standing and SBP running in 90s frequently at home.   ECG (personally reviewed):  NSR, RBBB, PAC  Labs (9/17): LDL 90 Labs (7/18): K 3.5, creatinine 1.28, hgb 10.7 Labs (8/18): LDL 68 Labs (9/18): K 4.1, creatinine 1.2, hgb 11.1, plts 98 K Labs (10/18): K 4.1,  creatinine 1.5 Labs (1/19): K 4.3, creatinine 1.54, LDL 80, HDL 49, LFTs normal, TSH normal Labs (4/19): K 4.3, creatinine 1.52, hgb 11.8 Labs (7/19): K 4.1, creatinine 1.5, LDL 75, LFTs normal, hgb 12.2 Labs (10/19): K 4.3, creatinine 1.53 Labs (12/19): K 4.1, creatinine 1.49 Labs (10/20): K 4.4, creatinine 1.67  Labs (1/21): LDL 66 Labs (12/20): K 4.1, creatinine 1.7 Labs (4/21): K 4.1, creatinine 1.71 Labs (5/21): K 4.5, creatinine 1.81 Labs (9/21): K 4.1, creatinine 1.88, LDL 59, HDL 41 Labs (4/22): K 4.4, creatinine 2.32 => 2.21, hgb 9.7  PMH: 1. COPD: PFTs (6/17) with severe obstructive airways disease.  2. AAA: s/p repair with stent graft.  3. Non-small cell lung cancer: Stage IIIA, diagnosed 3/17.  He had radiation as well as chemotherapy with carboplatin and paclitaxel.   4. CKD stage 3 5. PNA in 6/17 6. Recurrent right pleural effusion: VATS with talc pleurodesis on right.  No malignant cells noted.  7. HTN 8. H/o THR 9. CAD: CABG 1996 with SVG-D, SVG-PDA, LIMA-LAD.   - LHC (7/15): Totally occluded RCA and LAD.  Severe distal LM stenosis.  Patent SVG-PDA, patent LIMA-LAD.  Severe disease SVG-D.  Patient had DES to distal left main and staged PCI of SVG-D.   - Cardiolite (7/17): EF 30%, prior infarction with no ischemia.  - LHC (9/18): occluded SVG-small D, patent LIMA-LAD and SVG-PDA, 50% shelf-like  stenosis distal left main (no intervention).  10. Chronic systolic CHF: Ischemic cardiomyopathy.   - Echo (6/17): EF 30-35%. - Echo (7/18): EF 20-25%, moderate RV dilation with mildly decreased RV systolic function.  - RHC (9/18): mean RA 3, PA 28/9 mean 18, mean PCWP 10, CI 4.05.  - Echo (8/19): EF 30-35%, mildly decreased RV systolic function.   - Echo (2/21): EF 35%, mildly decreased RV systolic function 11. Carotid stenosis: Carotid dopplers (7/20) with 40-59% BICA stenosis.  - Carotid dopplers (7/21): 40-59% BICA stenosis.   Social History   Socioeconomic History  .  Marital status: Married    Spouse name: Not on file  . Number of children: 1  . Years of education: Not on file  . Highest education level: Not on file  Occupational History  . Occupation: Retired    Comment: [employer]  . Smoking status: Former Smoker    Packs/day: 1.50    Years: 30.00    Pack years: 45.00    Types: Cigarettes    Start date: 12/01/1956    Quit date: 01/08/1995    Years since quitting: 25.9  . Smokeless tobacco: Never Used  Vaping Use  . Vaping Use: Never used  Substance and Sexual Activity  . Alcohol use: No    Alcohol/week: 0.0 standard drinks    Comment: 03/18/2014 "no alacohol since 1996"  . Drug use: No  . Sexual activity: Never  Other Topics Concern  . Not on file  Social History Narrative  . Not on file   Social Determinants of Health   Financial Resource Strain: Not on file  Food Insecurity: Not on file  Transportation Needs: Not on file  Physical Activity: Not on file  Stress: Not on file  Social Connections: Not on file  Intimate Partner Violence: Not on file   Family History  Problem Relation Age of Onset  . Heart disease Father   . Cancer Father        Lung  . Arthritis Mother   . Parkinsonism Mother   . Arthritis Sister        Brother with rheumatoid arthritis  . Hypertension Brother   . Colon cancer Neg Hx   . Colon polyps Neg Hx    ROS: All systems reviewed and negative except as per HPI.   Current Outpatient Medications  Medication Sig Dispense Refill  . acetaminophen (TYLENOL) 500 MG tablet Take 500 mg by mouth every 6 (six) hours as needed for mild pain.    Marland Kitchen albuterol (PROAIR HFA) 108 (90 Base) MCG/ACT inhaler INHALE 2 PUFFS BY MOUTH EVERY 4 TO 6 HOURS AS NEEDED FOR WHEEZING. 8.5 g 0  . aspirin EC 81 MG tablet Take 81 mg by mouth daily before breakfast.     . clopidogrel (PLAVIX) 75 MG tablet TAKE (1) TABLET BY MOUTH ONCE DAILY. 90 tablet 0  . dapagliflozin propanediol (FARXIGA) 10 MG TABS tablet  Take 10 mg by mouth daily before breakfast. 30 tablet 11  . eplerenone (INSPRA) 50 MG tablet Take 1 tablet (50 mg total) by mouth every evening. 30 tablet 11  . fluticasone (FLONASE) 50 MCG/ACT nasal spray Place 1 spray into both nostrils daily. 16 g 3  . Fluticasone-Salmeterol (ADVAIR) 100-50 MCG/DOSE AEPB Inhale 1 puff into the lungs 2 (two) times daily.    Marland Kitchen ipratropium-albuterol (DUONEB) 0.5-2.5 (3) MG/3ML SOLN INHALE 1 VIAL VIA NEBULIZER 4 TIMES DAILY. 360 mL 3  . levofloxacin (LEVAQUIN) 500 MG tablet Take  one tablet (500mg ) by mouth daily for 10 days 10 tablet 0  . Multiple Vitamins-Minerals (CENTRUM SILVER ADULT 50+) TABS Take 1 tablet by mouth daily with lunch.     . nitroGLYCERIN (NITROSTAT) 0.4 MG SL tablet PLACE 1 TAB UNDER TONGUE EVERY 5 MIN IF NEEDED FOR CHEST PAIN. MAY USE 3 TIMES.NO RELIEF CALL 911. 25 tablet 1  . omeprazole (PRILOSEC) 20 MG capsule TAKE ONE CAPSULE BY MOUTH DAILY. 90 capsule 3  . rosuvastatin (CRESTOR) 20 MG tablet TAKE ONE TABLET BY MOUTH ONCE DAILY. 90 tablet 0  . sacubitril-valsartan (ENTRESTO) 49-51 MG TAKE (1) TABLET BY MOUTH TWICE DAILY. 60 tablet 11  . carvedilol (COREG) 12.5 MG tablet Take 1 tablet (12.5 mg total) by mouth 2 (two) times daily with a meal. 180 tablet 3  . furosemide (LASIX) 20 MG tablet Take 1 tablet (20 mg total) by mouth every other day. 45 tablet 3   No current facility-administered medications for this encounter.   BP 112/62   Pulse 86   Wt 85.2 kg (187 lb 12.8 oz)   SpO2 98%   BMI 26.95 kg/m  General: NAD Neck: No JVD, no thyromegaly or thyroid nodule.  Lungs: End expiratory wheezing. CV: Nondisplaced PMI.  Heart regular S1/S2, no S3/S4, no murmur.  No peripheral edema.  No carotid bruit.  Normal pedal pulses.  Abdomen: Soft, nontender, no hepatosplenomegaly, no distention.  Skin: Intact without lesions or rashes.  Neurologic: Alert and oriented x 3.  Psych: Normal affect. Extremities: No clubbing or cyanosis.  HEENT:  Normal.   Assessment/Plan: 1. Chronic systolic CHF: Ischemic cardiomyopathy.  Echo in 7/18 with EF 20-25%, echo 8/19 with EF 30-35%.  Echo in 2/21 showed EF 35%.  Usually NYHA class II symptoms, currently NYHA class III but also has had suspected COPD exacerbation.  He is not volume overloaded on exam and creatinine has been higher.  I think that his dyspnea is primarily related to COPD. - Continue Entresto 49/51 bid.  - Decrease Lasix to 20 mg every other day (elevated creatinine and does not appear volume overloaded).   - Decrease Coreg to 12.5 mg bid with orthostatic symptoms and soft BP on home checks.    - Continue eplerenone 50 mg daily.   - Continue dapagliflozin 10 mg daily.  - I will arrange for repeat echo.  - EF persistently low, qualifies for ICD.  He has a RBBB so not good CRT candidate.  He has not been interested in getting an ICD, think this is reasonable given age and lung cancer history.    2. CAD: s/p CABG then PCI 7/15 to distal LM and SVG-D.  Anginal equivalent in the past appears to have been dyspnea, he has never had significant chest pain.  LHC (9/18) showed patent LIMA-LAD and SVG-PDA; occluded SVG-D; 50% shelf-like LM stenosis.  - Continue ASA 81, Plavix, and Crestor.   - Consider Cardiolite if symptoms continue to be worse than baseline after treatment of AECOPD.  High threshold for cath with elevated creatinine.  3. COPD: Severe COPD by PFTs in 6/17.  Based on the last RHC/LHC, I have suspected that COPD plays a major role in his mild ongoing dyspnea.  Currently with wheezing on exam, I suspect that he has a COPD exacerbation.  - On levofloxacin and inhaler regimen to treat exacerbation.  4. Hyperlipidemia: Good LDL in 9/21.  5. NSCLC: Follows with Dr. Earlie Server.  He has finished chemo/radiation. Cancer appeared quiescent on most recent scans with  Dr. Earlie Server.   6. Right pleural effusion: This has been chronic. S/p VATS.  7. Carotid stenosis: Due for repeat dopplers in  7/22.  8. CKD stage 3: BMET today.   Followup with NP/PA in 3-4 weeks to reassess.   Loralie Champagne 12/13/2020

## 2020-12-14 ENCOUNTER — Inpatient Hospital Stay: Payer: Medicare Other

## 2020-12-14 ENCOUNTER — Other Ambulatory Visit: Payer: Self-pay

## 2020-12-14 ENCOUNTER — Telehealth: Payer: Self-pay | Admitting: Pulmonary Disease

## 2020-12-14 ENCOUNTER — Inpatient Hospital Stay (HOSPITAL_BASED_OUTPATIENT_CLINIC_OR_DEPARTMENT_OTHER): Payer: Medicare Other | Admitting: Internal Medicine

## 2020-12-14 VITALS — BP 128/60 | HR 90 | Temp 97.8°F | Resp 18 | Ht 70.0 in | Wt 185.7 lb

## 2020-12-14 DIAGNOSIS — C3490 Malignant neoplasm of unspecified part of unspecified bronchus or lung: Secondary | ICD-10-CM

## 2020-12-14 DIAGNOSIS — Z79899 Other long term (current) drug therapy: Secondary | ICD-10-CM | POA: Diagnosis not present

## 2020-12-14 DIAGNOSIS — I10 Essential (primary) hypertension: Secondary | ICD-10-CM | POA: Diagnosis not present

## 2020-12-14 DIAGNOSIS — B370 Candidal stomatitis: Secondary | ICD-10-CM | POA: Diagnosis not present

## 2020-12-14 DIAGNOSIS — C3411 Malignant neoplasm of upper lobe, right bronchus or lung: Secondary | ICD-10-CM | POA: Diagnosis not present

## 2020-12-14 DIAGNOSIS — I509 Heart failure, unspecified: Secondary | ICD-10-CM | POA: Diagnosis not present

## 2020-12-14 DIAGNOSIS — I252 Old myocardial infarction: Secondary | ICD-10-CM | POA: Diagnosis not present

## 2020-12-14 DIAGNOSIS — R590 Localized enlarged lymph nodes: Secondary | ICD-10-CM | POA: Diagnosis not present

## 2020-12-14 DIAGNOSIS — D539 Nutritional anemia, unspecified: Secondary | ICD-10-CM | POA: Diagnosis not present

## 2020-12-14 DIAGNOSIS — K219 Gastro-esophageal reflux disease without esophagitis: Secondary | ICD-10-CM | POA: Diagnosis not present

## 2020-12-14 DIAGNOSIS — J449 Chronic obstructive pulmonary disease, unspecified: Secondary | ICD-10-CM | POA: Diagnosis not present

## 2020-12-14 DIAGNOSIS — Z9049 Acquired absence of other specified parts of digestive tract: Secondary | ICD-10-CM | POA: Diagnosis not present

## 2020-12-14 DIAGNOSIS — I2510 Atherosclerotic heart disease of native coronary artery without angina pectoris: Secondary | ICD-10-CM | POA: Diagnosis not present

## 2020-12-14 DIAGNOSIS — Z8719 Personal history of other diseases of the digestive system: Secondary | ICD-10-CM | POA: Diagnosis not present

## 2020-12-14 LAB — RETICULOCYTES
Immature Retic Fract: 16.7 % — ABNORMAL HIGH (ref 2.3–15.9)
RBC.: 3.18 MIL/uL — ABNORMAL LOW (ref 4.22–5.81)
Retic Count, Absolute: 36.6 10*3/uL (ref 19.0–186.0)
Retic Ct Pct: 1.2 % (ref 0.4–3.1)

## 2020-12-14 LAB — FOLATE: Folate: 36.2 ng/mL (ref >5.9)

## 2020-12-14 LAB — IRON AND TIBC
Iron: 32 ug/dL — ABNORMAL LOW (ref 42–163)
Saturation Ratios: 16 % — ABNORMAL LOW (ref 20–55)
TIBC: 193 ug/dL — ABNORMAL LOW (ref 202–409)
UIBC: 161 ug/dL (ref 117–376)

## 2020-12-14 LAB — VITAMIN B12: Vitamin B-12: 885 pg/mL (ref 180–914)

## 2020-12-14 LAB — FERRITIN: Ferritin: 823 ng/mL — ABNORMAL HIGH (ref 24–336)

## 2020-12-14 MED ORDER — FLUCONAZOLE 100 MG PO TABS: 100.0000 mg | ORAL_TABLET | Freq: Every day | ORAL | 0 refills | Status: DC

## 2020-12-14 NOTE — Telephone Encounter (Signed)
Pt was seen by Dr. Julien Nordmann today, 12/14/20. Per Dr. Worthy Flank note, pt was scripted Diflucan for thrush.   Does he still need MMW? Thanks.

## 2020-12-14 NOTE — Progress Notes (Signed)
Instructions give for collecting heme occult cards.

## 2020-12-14 NOTE — Telephone Encounter (Signed)
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  We perform rapid antigen Covid testing which he had brought with him which was negative he does not have any sick contacts. Chest x-ray does not show any evidence of pneumonia and is unchanged from his baseline. We will obtain blood work including CBC and a UA to see if he has signs of systemic infection or UTI       Patient Instructions by Rigoberto Noel, MD at 12/08/2020 11:00 AM  Author: Rigoberto Noel, MD Author Type: Physician Filed: 12/08/2020 11:30 AM  Note Status: Addendum Cosign: Cosign Not Required Encounter Date: 12/08/2020  Editor: Rigoberto Noel, MD (Physician)      Prior Versions: 1. Rigoberto Noel, MD (Physician) at 12/08/2020 11:00 AM - Addendum   2. Rigoberto Noel, MD (Physician) at 12/08/2020 11:00 AM - Signed  Levaquin 500 mg daily x 10 days   CBC/ BMET / UA/ urine culture  today        Instructions  Levaquin 500 mg daily x 10 days   CBC/  BMET / UA/ urine culture  today     Reason for call: Patient stated that he has a sore in his mouth from using an inhaler. Patient states sore present yesterday afternoon and is located on left side of tongue. The sore is red and purple colored and sore. Patient is concerned if this is thrush and would like something for it. Patient states has been rinsing mouth and brushing teeth after each use of inhaler (Advair). Patient wants to see doctor at [facility] ([place]) to look at his mouth.   Dr Elsworth Soho please advise.   Allergies  Allergen Reactions  . Neomycin Hives  . Cetirizine & Related Rash    Immunization History  Administered Date(s) Administered  . Fluad Quad(high Dose 65+) 05/19/2019  . H1N1 08/06/2008  . Influenza Split 06/24/2013  . Influenza,inj,Quad PF,6+ Mos 06/15/2014, 05/31/2015, 05/09/2016, 06/06/2017, 06/10/2018  . Influenza-Unspecified 05/27/2012, 05/30/2020  . Moderna Sars-Covid-2 Vaccination 09/11/2019, 10/06/2019, 06/21/2020  . Pneumococcal Conjugate-13 07/01/2014, 08/18/2014  . Pneumococcal Polysaccharide-23 03/27/2012  . Pneumococcal-Unspecified 05/27/2005  . Tdap 07/05/2011  . Zoster 07/27/2008

## 2020-12-14 NOTE — Telephone Encounter (Signed)
Attempted to call pt but unable to reach. Left message for him to return call. °

## 2020-12-14 NOTE — Telephone Encounter (Signed)
LMTCB

## 2020-12-14 NOTE — Telephone Encounter (Signed)
Paul Sandoval would be white in color.  He did not have any when I examined him few days ago. This is more likely an aphthous ulcer. Would suggest Magic mouthwash 5 by mouth twice daily as needed x 100 ml -continue to rinse his mouth after Advair like he is doing  He can follow-up with his PCP if no better in 10 days

## 2020-12-14 NOTE — Telephone Encounter (Signed)
Called and spoke with pt and he stated that he was very disappointed in how he was treated today when he came into the office.  He just wanted to see if someone could see him for the thrush that he felt like he had.  He felt like he was dismissed and did not like being treated that way.  I did apologize to the patient and he stated that he was treated and that was all that matters.

## 2020-12-14 NOTE — Telephone Encounter (Signed)
Does not

## 2020-12-14 NOTE — Progress Notes (Signed)
[place] Telephone:([phone]) [phone]   [phone]  OFFICE PROGRESS NOTE  Erven Colla, DO [place] [address]  DIAGNOSIS: Stage IIIA (T2b, N2, M0) non-small cell lung cancer, favoring squamous cell carcinoma presented with right upper lobe lung mass in addition to mediastinal lymphadenopathy diagnosed in March 2017.  PRIOR THERAPY:  A course of concurrent chemoradiation with weekly carboplatin for AUC of 2 and paclitaxel 45 MG/M2. Status post 5 cycle with partial response.  CURRENT THERAPY: Observation  INTERVAL HISTORY: Paul Sandoval. 80 y.o. male returns to the clinic today for follow-up visit.  The patient is feeling fine today with no concerning complaints except for fatigue.  He was also recently diagnosed with pneumonia and treated with a course of antibiotics.  He also complain of oral thrush secondary to his steroid inhaler.  The patient denied having any current chest pain but has shortness of breath with exertion with mild cough and no hemoptysis.  He denied having any nausea, vomiting, diarrhea or constipation.  He has no melena or hematochezia.  He denied having any recent weight loss or night sweats.  He has no headache or visual changes.  He had repeat CT scan of the chest performed recently and he is here for evaluation and discussion of his scan results.   MEDICAL HISTORY: Past Medical History:  Diagnosis Date  . AAA (abdominal aortic aneurysm) ([place]) 2010   4.4 cm 08/2008;4.44 in 7/10 and 4.65 in 08/2009; 4.8 by CT in 11/2009; 4.3 by ultrasound in 08/2010  . Anemia   . Arteriosclerotic cardiovascular disease (ASCVD) 1996   CABG-1996  . Arthritis    "fingers" (03/18/2014)  . CAD (coronary artery disease)    03/18/14:  PCI with DES to distal left main. 7/29: DES to the SVG to Diag  . Cancer ([place])    Upper right lobe lung cancer  . Cardiomyopathy, ischemic    Echo 03/17/14: EF 45-50%  . CHF (congestive heart failure) ([place])    . Chronic bronchitis ([place])   . Chronic kidney disease    CRF  . Chronic rhinitis   . Colonic polyp 2002   polypectomy in 2002  . COPD (chronic obstructive pulmonary disease) ([place])   . Diverticulosis   . Dyspnea    with exertion  . ED (erectile dysfunction)   . Encounter for antineoplastic chemotherapy 12/19/2015  . GERD (gastroesophageal reflux disease)   . History of blood transfusion   . Hyperlipidemia   . Hypertension   . IFG (impaired fasting glucose)   . Myocardial infarction [facility])    "told h/o silent MI sometime before 1996"  . Pneumonia ~ 2001; ~ 2005    has had more than twice  . Right bundle branch block   . Tobacco abuse, in remission    40 pack year total consumption; discontinued in 1996    ALLERGIES:  is allergic to neomycin and cetirizine & related.  MEDICATIONS:  Current Outpatient Medications  Medication Sig Dispense Refill  . acetaminophen (TYLENOL) 500 MG tablet Take 500 mg by mouth every 6 (six) hours as needed for mild pain.    Marland Kitchen albuterol (PROAIR HFA) 108 (90 Base) MCG/ACT inhaler INHALE 2 PUFFS BY MOUTH EVERY 4 TO 6 HOURS AS NEEDED FOR WHEEZING. 8.5 g 0  . aspirin EC 81 MG tablet Take 81 mg by mouth daily before breakfast.     . carvedilol (COREG) 12.5 MG tablet Take 1 tablet (12.5  mg total) by mouth 2 (two) times daily with a meal. 180 tablet 3  . clopidogrel (PLAVIX) 75 MG tablet TAKE (1) TABLET BY MOUTH ONCE DAILY. 90 tablet 0  . dapagliflozin propanediol (FARXIGA) 10 MG TABS tablet Take 10 mg by mouth daily before breakfast. 30 tablet 11  . eplerenone (INSPRA) 50 MG tablet Take 1 tablet (50 mg total) by mouth every evening. 30 tablet 11  . fluticasone (FLONASE) 50 MCG/ACT nasal spray Place 1 spray into both nostrils daily. 16 g 3  . Fluticasone-Salmeterol (ADVAIR) 100-50 MCG/DOSE AEPB Inhale 1 puff into the lungs 2 (two) times daily.    . furosemide (LASIX) 20 MG tablet Take 1 tablet (20 mg total) by mouth every other day. 45 tablet 3  .  ipratropium-albuterol (DUONEB) 0.5-2.5 (3) MG/3ML SOLN INHALE 1 VIAL VIA NEBULIZER 4 TIMES DAILY. 360 mL 3  . levofloxacin (LEVAQUIN) 500 MG tablet Take one tablet (533m) by mouth daily for 10 days 10 tablet 0  . Multiple Vitamins-Minerals (CENTRUM SILVER ADULT 50+) TABS Take 1 tablet by mouth daily with lunch.     . nitroGLYCERIN (NITROSTAT) 0.4 MG SL tablet PLACE 1 TAB UNDER TONGUE EVERY 5 MIN IF NEEDED FOR CHEST PAIN. MAY USE 3 TIMES.NO RELIEF CALL 911. 25 tablet 1  . omeprazole (PRILOSEC) 20 MG capsule TAKE ONE CAPSULE BY MOUTH DAILY. 90 capsule 3  . rosuvastatin (CRESTOR) 20 MG tablet TAKE ONE TABLET BY MOUTH ONCE DAILY. 90 tablet 0  . sacubitril-valsartan (ENTRESTO) 49-51 MG TAKE (1) TABLET BY MOUTH TWICE DAILY. 60 tablet 11   No current facility-administered medications for this visit.    SURGICAL HISTORY:  Past Surgical History:  Procedure Laterality Date  . ABDOMINAL AORTIC ANEURYSM REPAIR  11/2012  . ABDOMINAL AORTIC ENDOVASCULAR STENT GRAFT N/A 12/11/2012   Procedure: ABDOMINAL AORTIC ENDOVASCULAR STENT GRAFT;  Surgeon: JMal Misty MD;  [facility]  Service: Vascular;  Laterality: N/A;  Ultrasound guided; [place]  . CARDIAC CATHETERIZATION  01/08/1995  . COLONOSCOPY  2002   polypectomy-patient denies  . CORONARY ANGIOPLASTY WITH STENT PLACEMENT  03/18/2014   "1"  . CORONARY ANGIOPLASTY WITH STENT PLACEMENT  03/24/2014   "1"  . CORONARY ARTERY BYPASS GRAFT  01/09/1995   "CABG X3"  . FLEXIBLE BRONCHOSCOPY N/A 03/01/2017   Procedure: FLEXIBLE BRONCHOSCOPY WITH BIOPSIES;  Surgeon: BGaye Pollack MD;  [facility];  Service: Thoracic;  Laterality: N/A;  . JOINT REPLACEMENT    . LAPAROSCOPIC CHOLECYSTECTOMY  12/2009  . LEFT AND RIGHT HEART CATHETERIZATION WITH CORONARY/GRAFT ANGIOGRAM N/A 03/18/2014   Procedure: LEFT AND RIGHT HEART CATHETERIZATION WITH CBeatrix Fetters  Surgeon: MBlane Ohara MD;  [facility];  Service: Cardiovascular;  Laterality: N/A;   . PERCUTANEOUS CORONARY STENT INTERVENTION (PCI-S)  03/18/2014   Procedure: PERCUTANEOUS CORONARY STENT INTERVENTION (PCI-S);  Surgeon: MBlane Ohara MD;  [facility];  Service: Cardiovascular;;  . PERCUTANEOUS CORONARY STENT INTERVENTION (PCI-S) N/A 03/24/2014   Procedure: PERCUTANEOUS CORONARY STENT INTERVENTION (PCI-S);  Surgeon: MBlane Ohara MD;  [facility];  Service: Cardiovascular;  Laterality: N/A;  . PLEURAL EFFUSION DRAINAGE Right 03/01/2017   Procedure: DRAINAGE OF PLEURAL EFFUSION;  Surgeon: BGaye Pollack MD;  [facility]  Service: Thoracic;  Laterality: Right;  . RIGHT/LEFT HEART CATH AND CORONARY/GRAFT ANGIOGRAPHY N/A 05/15/2017   Procedure: RIGHT/LEFT HEART CATH AND CORONARY/GRAFT ANGIOGRAPHY;  Surgeon: MLarey Dresser MD;  [facility];  Service: Cardiovascular;  Laterality:  N/A;  Bo Merino PLEURODESIS Right 03/01/2017   Procedure: Pietro Cassis;  Surgeon: Gaye Pollack, MD;  [facility];  Service: Thoracic;  Laterality: Right;  . TOTAL HIP ARTHROPLASTY Left 01/21/2013   Procedure: TOTAL HIP ARTHROPLASTY ANTERIOR APPROACH;  Surgeon: Mauri Pole, MD;  Location: [place];  Service: Orthopedics;  Laterality: Left;  Marland Kitchen VIDEO ASSISTED THORACOSCOPY Right 03/01/2017   Procedure: VIDEO ASSISTED THORACOSCOPY WITH BIOPSIES;  Surgeon: Gaye Pollack, MD;  Location: [place];  Service: Thoracic;  Laterality: Right;  Marland Kitchen VIDEO BRONCHOSCOPY N/A 11/17/2015   Procedure: VIDEO BRONCHOSCOPY WITH FLUORO;  Surgeon: Rigoberto Noel, MD;  Location: [place];  Service: Cardiopulmonary;  Laterality: N/A;    REVIEW OF SYSTEMS:  Constitutional: positive for fatigue Eyes: negative Ears, nose, mouth, throat, and face: negative Respiratory: positive for cough and dyspnea on exertion Cardiovascular: negative Gastrointestinal: negative Genitourinary:negative Integument/breast: negative Hematologic/lymphatic: negative Musculoskeletal:negative Neurological:  negative Behavioral/Psych: negative Endocrine: negative Allergic/Immunologic: negative   PHYSICAL EXAMINATION: General appearance: alert, cooperative, fatigued and no distress Head: Normocephalic, without obvious abnormality, atraumatic Neck: no adenopathy, no JVD, supple, symmetrical, trachea midline and thyroid not enlarged, symmetric, no tenderness/mass/nodules Lymph nodes: Cervical, supraclavicular, and axillary nodes normal. Resp: clear to auscultation bilaterally Back: symmetric, no curvature. ROM normal. No CVA tenderness. Cardio: regular rate and rhythm, S1, S2 normal, no murmur, click, rub or gallop GI: soft, non-tender; bowel sounds normal; no masses,  no organomegaly Extremities: extremities normal, atraumatic, no cyanosis or edema Neurologic: Alert and oriented X 3, normal strength and tone. Normal symmetric reflexes. Normal coordination and gait  ECOG PERFORMANCE STATUS: 1 - Symptomatic but completely ambulatory  Blood pressure 128/60, pulse 90, temperature 97.8 F (36.6 C), temperature source Tympanic, resp. rate 18, height _0  (1.778 m), weight 185 lb 11.2 oz (84.2 kg), SpO2 97 %.  LABORATORY DATA: Lab Results  Component Value Date   WBC 7.3 12/12/2020   HGB 9.7 (L) 12/12/2020   HCT 30.1 (L) 12/12/2020   MCV 86.7 12/12/2020   PLT 160 12/12/2020      Chemistry      Component Value Date/Time   NA 137 12/12/2020 0838   NA 140 05/23/2020 0821   NA 137 05/21/2017 0906   K 4.4 12/12/2020 0838   K 4.1 05/21/2017 0906   CL 102 12/12/2020 0838   CO2 24 12/12/2020 0838   CO2 29 05/21/2017 0906   BUN 35 (H) 12/12/2020 0838   BUN 28 (H) 05/23/2020 0821   BUN 14.6 05/21/2017 0906   CREATININE 2.21 (H) 12/12/2020 0838   CREATININE 1.2 05/21/2017 0906      Component Value Date/Time   CALCIUM 9.2 12/12/2020 0838   CALCIUM 9.4 05/21/2017 0906   ALKPHOS 135 (H) 12/12/2020 0838   ALKPHOS 86 05/21/2017 0906   AST 26 12/12/2020 0838   AST 19 05/21/2017 0906   ALT  21 12/12/2020 0838   ALT 14 05/21/2017 0906   BILITOT 0.4 12/12/2020 0838   BILITOT 0.57 05/21/2017 0906       RADIOGRAPHIC STUDIES: DG Chest 2 View  Result Date: 12/08/2020 CLINICAL DATA:  Nonproductive cough EXAM: CHEST - 2 VIEW COMPARISON:  Chest radiograph 05/20/2020 Chest CT 06/09/2020 FINDINGS: Cardiomediastinal silhouette and pulmonary vasculature are within normal limits. Postsurgical changes of CABG are again seen. Right suprahilar opacity and pleural effusion is not significantly changed in appearance since prior exam. Left lung remains well aerated. IMPRESSION: Unchanged chronic opacity of the right lung likely due to previously treated malignancy  and pleural effusion. If the patient's symptoms continue, further evaluation with chest CT should be performed. Electronically Signed   By: Miachel Roux M.D.   On: 12/08/2020 17:03   CT Chest Wo Contrast  Result Date: 12/12/2020 CLINICAL DATA:  Non-small cell cancer staging in a 80 year old. EXAM: CT CHEST WITHOUT CONTRAST TECHNIQUE: Multidetector CT imaging of the chest was performed following the standard protocol without IV contrast. COMPARISON:  June 09, 2020 FINDINGS: Cardiovascular: Calcified atheromatous plaque in the thoracic aorta. Normal heart size without pericardial effusion. Post median sternotomy for coronary revascularization. Normal caliber of the central pulmonary vasculature. Limited assessment of cardiovascular structures given lack of intravenous contrast. Mediastinum/Nodes: Thoracic inlet structures are normal. No axillary lymphadenopathy. No mediastinal lymphadenopathy with fullness of subcarinal nodal tissue up to 11 mm short axis without change from previous imaging. RIGHT-sided perihilar post treatment changes. Esophagus grossly normal. Lungs/Pleura: Post treatment changes in the RIGHT upper lobe tracking from the RIGHT hilum. Loculated RIGHT apical and basilar pleural fluid with peripheral hyperdense areas with similar  appearance. Increasing consolidative changes about the RIGHT infrahilar region just above the RIGHT hemidiaphragm. Subtle areas of nodularity have developed since the prior study. (Image 34, series 4) 5 mm nodule in the LEFT upper lobe. (Image 80, series 4) 8 x 8 mm LEFT upper lobe pulmonary nodule with some scattered smaller areas of nodularity seen peripheral to this. Generalized nodularity in the RIGHT mid chest in the RIGHT middle lobe (image 96, series 4) 8 x 7 mm pulmonary nodule with numerous other ill-defined areas of primarily ground-glass, associated with septal thickening in the RIGHT chest. Upper Abdomen: Incidental imaging of upper abdominal contents shows post cholecystectomy changes. Adrenal glands are normal. Imaged portions of pancreas, spleen and kidneys are unremarkable. Partially imaged postprocedural changes of aorto bi-iliac endo grafting. Musculoskeletal: No acute bone finding. No destructive bone process. Spinal degenerative changes. IMPRESSION: 1. Post treatment changes in the RIGHT upper lobe tracking from the RIGHT hilum. 2. Areas of nodularity have developed since the prior study along with increasing consolidative changes in the RIGHT chest. Reportedly there are recent respiratory symptoms, correlate with history for confirmation. This could be post infectious or inflammatory though given the patient's history of lung cancer would suggest short interval follow-up to ensure resolution, within 8-12 weeks. 3. Stable fullness of subcarinal nodal tissue. 4. Stable appearance of loculated pleural fluid in the RIGHT chest. 5. Loculated RIGHT apical and basilar pleural fluid with peripheral hyperdense areas with similar appearance. 6. Post median sternotomy for coronary revascularization. 7. Partially imaged postprocedural changes of aorto bi-iliac endo grafting. Aortic Atherosclerosis (ICD10-I70.0). Electronically Signed   By: Zetta Bills M.D.   On: 12/12/2020 10:56    ASSESSMENT AND  PLAN:  This is a very pleasant 80 years old white male with a stage IIIA non-small cell lung cancer status post a course of concurrent chemoradiation with weekly carboplatin and paclitaxel and he had a rough time with the treatment at that time. He did not receive consolidation chemotherapy. The patient is current on observation and he is feeling fine except for fatigue and recent diagnosis with suspicious pneumonia.  He is currently on treatment with a course of antibiotics by Dr. Elsworth Soho. He had repeat CT scan of the chest that showed suspicious area of inflammatory changes in the right lung but also has a history of lung cancer was recommended to do imaging studies sooner than 6 months. I will see him back for follow-up visit in 3 months for  evaluation with repeat CT scan of the chest for restaging of his disease. For the oral thrush, I will start the patient on Diflucan 100 mg p.o. daily for 10 days. For the anemia, I will check his stool for Hemoccult.  I will also check an anemia panel today to identify the etiology of his worsening hemoglobin and hematocrit. The patient was advised to call immediately if he has any other concerning symptoms in the interval. The patient voices understanding of current disease status and treatment options and is in agreement with the current care plan. All questions were answered. The patient knows to call the clinic with any problems, questions or concerns. We can certainly see the patient much sooner if necessary.   Disclaimer: This note was dictated with voice recognition software. Similar sounding words can inadvertently be transcribed and may not be corrected upon review.

## 2020-12-15 ENCOUNTER — Telehealth: Payer: Self-pay | Admitting: Internal Medicine

## 2020-12-15 LAB — ERYTHROPOIETIN: Erythropoietin: 18.3 m[IU]/mL (ref 2.6–18.5)

## 2020-12-15 NOTE — Telephone Encounter (Signed)
Scheduled per los. Called and left msg. Mailed printout  °

## 2020-12-16 LAB — PROTEIN ELECTROPHORESIS, SERUM, WITH REFLEX
A/G Ratio: 0.8 (ref 0.7–1.7)
Albumin ELP: 2.8 g/dL — ABNORMAL LOW (ref 2.9–4.4)
Alpha-1-Globulin: 0.4 g/dL (ref 0.0–0.4)
Alpha-2-Globulin: 1.2 g/dL — ABNORMAL HIGH (ref 0.4–1.0)
Beta Globulin: 0.9 g/dL (ref 0.7–1.3)
Gamma Globulin: 1 g/dL (ref 0.4–1.8)
Globulin, Total: 3.6 g/dL (ref 2.2–3.9)
Total Protein ELP: 6.4 g/dL (ref 6.0–8.5)

## 2020-12-19 ENCOUNTER — Other Ambulatory Visit (HOSPITAL_COMMUNITY)
Admission: RE | Admit: 2020-12-19 | Discharge: 2020-12-19 | Disposition: A | Discharge status: Home / Self Care | Payer: Medicare Other | Source: Ambulatory Visit | Attending: Pulmonary Disease | Admitting: Pulmonary Disease

## 2020-12-19 ENCOUNTER — Other Ambulatory Visit: Payer: Self-pay

## 2020-12-19 ENCOUNTER — Other Ambulatory Visit (HOSPITAL_COMMUNITY): Payer: Self-pay | Admitting: Cardiology

## 2020-12-19 DIAGNOSIS — R059 Cough, unspecified: Secondary | ICD-10-CM | POA: Insufficient documentation

## 2020-12-19 LAB — BASIC METABOLIC PANEL
Anion gap: 7 (ref 5–15)
BUN: 17 mg/dL (ref 8–23)
CO2: 26 mmol/L (ref 22–32)
Calcium: 8.8 mg/dL — ABNORMAL LOW (ref 8.9–10.3)
Chloride: 105 mmol/L (ref 98–111)
Creatinine, Ser: 1.48 mg/dL — ABNORMAL HIGH (ref 0.61–1.24)
GFR, Estimated: 47 mL/min — ABNORMAL LOW (ref >60)
Glucose, Bld: 97 mg/dL (ref 70–99)
Potassium: 4.2 mmol/L (ref 3.5–5.1)
Sodium: 138 mmol/L (ref 135–145)

## 2020-12-20 ENCOUNTER — Telehealth: Payer: Self-pay | Admitting: Pulmonary Disease

## 2020-12-20 NOTE — Telephone Encounter (Signed)
Called and spoke with patient who stated he is looking to get his lab results. Patient had labwork (Basic metabolic panel ) drawn yesterday (12/19/2020) which was ordered on 12/08/20 to be drawn in 10 days per Dr Elsworth Soho. Advised patient that we will call him back once Dr Elsworth Soho has resulted his labs. Patient would also like a paper copy of his lab results mailed to him.   Dr Elsworth Soho please advise.

## 2020-12-20 NOTE — Telephone Encounter (Signed)
Sodium has improved and his creatinine is also decreased to 1.5 which is the lowest I have seen in a while Dosing of Lasix per Dr. Aundra Dubin

## 2020-12-20 NOTE — Telephone Encounter (Signed)
Called and went over lab results per Dr Elsworth Soho with patient. All questions answered and patient expressed full understanding. Copy of lab result printed and sent to patient per patient request. Confirmed home address. Patient aware and expressed full understanding of Dr Bari Mantis recommendation. Nothing further needed at this time.

## 2020-12-21 DIAGNOSIS — K219 Gastro-esophageal reflux disease without esophagitis: Secondary | ICD-10-CM | POA: Diagnosis not present

## 2020-12-21 DIAGNOSIS — Z9049 Acquired absence of other specified parts of digestive tract: Secondary | ICD-10-CM | POA: Diagnosis not present

## 2020-12-21 DIAGNOSIS — I252 Old myocardial infarction: Secondary | ICD-10-CM | POA: Diagnosis not present

## 2020-12-21 DIAGNOSIS — J449 Chronic obstructive pulmonary disease, unspecified: Secondary | ICD-10-CM | POA: Diagnosis not present

## 2020-12-21 DIAGNOSIS — R590 Localized enlarged lymph nodes: Secondary | ICD-10-CM | POA: Diagnosis not present

## 2020-12-21 DIAGNOSIS — B370 Candidal stomatitis: Secondary | ICD-10-CM | POA: Diagnosis not present

## 2020-12-21 DIAGNOSIS — C3411 Malignant neoplasm of upper lobe, right bronchus or lung: Secondary | ICD-10-CM | POA: Diagnosis not present

## 2020-12-21 DIAGNOSIS — I509 Heart failure, unspecified: Secondary | ICD-10-CM | POA: Diagnosis not present

## 2020-12-21 DIAGNOSIS — Z79899 Other long term (current) drug therapy: Secondary | ICD-10-CM | POA: Diagnosis not present

## 2020-12-21 DIAGNOSIS — Z8719 Personal history of other diseases of the digestive system: Secondary | ICD-10-CM | POA: Diagnosis not present

## 2020-12-21 DIAGNOSIS — I2510 Atherosclerotic heart disease of native coronary artery without angina pectoris: Secondary | ICD-10-CM | POA: Diagnosis not present

## 2020-12-21 LAB — OCCULT BLOOD X 1 CARD TO LAB, STOOL
Fecal Occult Bld: NEGATIVE
Fecal Occult Bld: NEGATIVE
Fecal Occult Bld: NEGATIVE

## 2020-12-24 ENCOUNTER — Emergency Department (HOSPITAL_COMMUNITY): Payer: Medicare Other

## 2020-12-24 ENCOUNTER — Observation Stay (HOSPITAL_COMMUNITY)
Admission: EM | Admit: 2020-12-24 | Discharge: 2020-12-25 | Disposition: A | Discharge status: Home / Self Care | Payer: Medicare Other | Attending: Internal Medicine | Admitting: Internal Medicine

## 2020-12-24 ENCOUNTER — Other Ambulatory Visit: Payer: Self-pay

## 2020-12-24 DIAGNOSIS — J90 Pleural effusion, not elsewhere classified: Secondary | ICD-10-CM | POA: Diagnosis not present

## 2020-12-24 DIAGNOSIS — Z951 Presence of aortocoronary bypass graft: Secondary | ICD-10-CM | POA: Insufficient documentation

## 2020-12-24 DIAGNOSIS — Z85118 Personal history of other malignant neoplasm of bronchus and lung: Secondary | ICD-10-CM | POA: Insufficient documentation

## 2020-12-24 DIAGNOSIS — Z96642 Presence of left artificial hip joint: Secondary | ICD-10-CM | POA: Diagnosis not present

## 2020-12-24 DIAGNOSIS — I130 Hypertensive heart and chronic kidney disease with heart failure and stage 1 through stage 4 chronic kidney disease, or unspecified chronic kidney disease: Secondary | ICD-10-CM | POA: Diagnosis not present

## 2020-12-24 DIAGNOSIS — N1831 Chronic kidney disease, stage 3a: Secondary | ICD-10-CM | POA: Insufficient documentation

## 2020-12-24 DIAGNOSIS — I509 Heart failure, unspecified: Secondary | ICD-10-CM

## 2020-12-24 DIAGNOSIS — Z20822 Contact with and (suspected) exposure to covid-19: Secondary | ICD-10-CM | POA: Diagnosis not present

## 2020-12-24 DIAGNOSIS — Z87891 Personal history of nicotine dependence: Secondary | ICD-10-CM | POA: Insufficient documentation

## 2020-12-24 DIAGNOSIS — Z79899 Other long term (current) drug therapy: Secondary | ICD-10-CM | POA: Insufficient documentation

## 2020-12-24 DIAGNOSIS — I5023 Acute on chronic systolic (congestive) heart failure: Principal | ICD-10-CM

## 2020-12-24 DIAGNOSIS — I517 Cardiomegaly: Secondary | ICD-10-CM | POA: Diagnosis not present

## 2020-12-24 DIAGNOSIS — R0602 Shortness of breath: Secondary | ICD-10-CM | POA: Diagnosis not present

## 2020-12-24 DIAGNOSIS — Z7982 Long term (current) use of aspirin: Secondary | ICD-10-CM | POA: Diagnosis not present

## 2020-12-24 DIAGNOSIS — I2510 Atherosclerotic heart disease of native coronary artery without angina pectoris: Secondary | ICD-10-CM | POA: Insufficient documentation

## 2020-12-24 DIAGNOSIS — I110 Hypertensive heart disease with heart failure: Secondary | ICD-10-CM | POA: Diagnosis not present

## 2020-12-24 DIAGNOSIS — J449 Chronic obstructive pulmonary disease, unspecified: Secondary | ICD-10-CM | POA: Diagnosis not present

## 2020-12-24 DIAGNOSIS — C3490 Malignant neoplasm of unspecified part of unspecified bronchus or lung: Secondary | ICD-10-CM | POA: Diagnosis not present

## 2020-12-24 LAB — CBC WITH DIFFERENTIAL/PLATELET
Abs Immature Granulocytes: 0.02 10*3/uL (ref 0.00–0.07)
Basophils Absolute: 0 10*3/uL (ref 0.0–0.1)
Basophils Relative: 0 %
Eosinophils Absolute: 0 10*3/uL (ref 0.0–0.5)
Eosinophils Relative: 0 %
HCT: 30.3 % — ABNORMAL LOW (ref 39.0–52.0)
Hemoglobin: 9.2 g/dL — ABNORMAL LOW (ref 13.0–17.0)
Immature Granulocytes: 0 %
Lymphocytes Relative: 11 %
Lymphs Abs: 0.8 10*3/uL (ref 0.7–4.0)
MCH: 28 pg (ref 26.0–34.0)
MCHC: 30.4 g/dL (ref 30.0–36.0)
MCV: 92.4 fL (ref 80.0–100.0)
Monocytes Absolute: 0.7 10*3/uL (ref 0.1–1.0)
Monocytes Relative: 10 %
Neutro Abs: 5.6 10*3/uL (ref 1.7–7.7)
Neutrophils Relative %: 79 %
Platelets: 153 10*3/uL (ref 150–400)
RBC: 3.28 MIL/uL — ABNORMAL LOW (ref 4.22–5.81)
RDW: 14.9 % (ref 11.5–15.5)
WBC: 7.1 10*3/uL (ref 4.0–10.5)
nRBC: 0 % (ref 0.0–0.2)

## 2020-12-24 LAB — TROPONIN I (HIGH SENSITIVITY)
Troponin I (High Sensitivity): 20 ng/L — ABNORMAL HIGH (ref <18)
Troponin I (High Sensitivity): 19 ng/L — ABNORMAL HIGH (ref <18)

## 2020-12-24 LAB — COMPREHENSIVE METABOLIC PANEL
ALT: 12 U/L (ref 0–44)
AST: 16 U/L (ref 15–41)
Albumin: 3.3 g/dL — ABNORMAL LOW (ref 3.5–5.0)
Alkaline Phosphatase: 75 U/L (ref 38–126)
Anion gap: 7 (ref 5–15)
BUN: 14 mg/dL (ref 8–23)
CO2: 27 mmol/L (ref 22–32)
Calcium: 8.5 mg/dL — ABNORMAL LOW (ref 8.9–10.3)
Chloride: 105 mmol/L (ref 98–111)
Creatinine, Ser: 1.37 mg/dL — ABNORMAL HIGH (ref 0.61–1.24)
GFR, Estimated: 52 mL/min — ABNORMAL LOW (ref >60)
Glucose, Bld: 99 mg/dL (ref 70–99)
Potassium: 4.3 mmol/L (ref 3.5–5.1)
Sodium: 139 mmol/L (ref 135–145)
Total Bilirubin: 0.3 mg/dL (ref 0.3–1.2)
Total Protein: 6.9 g/dL (ref 6.5–8.1)

## 2020-12-24 LAB — SARS CORONAVIRUS 2 (TAT 6-24 HRS): SARS Coronavirus 2: NEGATIVE

## 2020-12-24 LAB — BRAIN NATRIURETIC PEPTIDE: B Natriuretic Peptide: 1753 pg/mL — ABNORMAL HIGH (ref 0.0–100.0)

## 2020-12-24 MED ORDER — ALBUTEROL SULFATE (2.5 MG/3ML) 0.083% IN NEBU: 3.0000 mL | INHALATION_SOLUTION | Freq: Four times a day (QID) | RESPIRATORY_TRACT | Status: DC | PRN

## 2020-12-24 MED ORDER — ENOXAPARIN SODIUM 40 MG/0.4ML IJ SOSY
40.0000 mg | PREFILLED_SYRINGE | INTRAMUSCULAR | Status: DC
  Administered 2020-12-24: 40 mg via SUBCUTANEOUS
  Filled 2020-12-24: qty 0.4

## 2020-12-24 MED ORDER — FLUTICASONE PROPIONATE 50 MCG/ACT NA SUSP
1.0000 | Freq: Every day | NASAL | Status: DC
  Administered 2020-12-25: 1 via NASAL
  Filled 2020-12-24: qty 16

## 2020-12-24 MED ORDER — ADULT MULTIVITAMIN W/MINERALS CH
1.0000 | ORAL_TABLET | Freq: Every day | ORAL | Status: DC
  Administered 2020-12-25: 1 via ORAL
  Filled 2020-12-24: qty 1

## 2020-12-24 MED ORDER — ASPIRIN EC 81 MG PO TBEC
81.0000 mg | DELAYED_RELEASE_TABLET | Freq: Every day | ORAL | Status: DC
  Administered 2020-12-25: 81 mg via ORAL
  Filled 2020-12-24: qty 1

## 2020-12-24 MED ORDER — FUROSEMIDE 40 MG PO TABS
40.0000 mg | ORAL_TABLET | Freq: Every day | ORAL | Status: DC
  Administered 2020-12-25: 40 mg via ORAL
  Filled 2020-12-24: qty 1

## 2020-12-24 MED ORDER — GUAIFENESIN-DM 100-10 MG/5ML PO SYRP
5.0000 mL | ORAL_SOLUTION | ORAL | Status: DC | PRN
  Administered 2020-12-24: 5 mL via ORAL
  Filled 2020-12-24 (×2): qty 10

## 2020-12-24 MED ORDER — CLOPIDOGREL BISULFATE 75 MG PO TABS
75.0000 mg | ORAL_TABLET | Freq: Every day | ORAL | Status: DC
  Administered 2020-12-25: 75 mg via ORAL
  Filled 2020-12-24: qty 1

## 2020-12-24 MED ORDER — CARVEDILOL 12.5 MG PO TABS
12.5000 mg | ORAL_TABLET | Freq: Two times a day (BID) | ORAL | Status: DC
  Administered 2020-12-24 – 2020-12-25 (×2): 12.5 mg via ORAL
  Filled 2020-12-24 (×2): qty 1

## 2020-12-24 MED ORDER — IPRATROPIUM-ALBUTEROL 0.5-2.5 (3) MG/3ML IN SOLN
3.0000 mL | Freq: Four times a day (QID) | RESPIRATORY_TRACT | Status: DC
  Administered 2020-12-24: 3 mL via RESPIRATORY_TRACT
  Filled 2020-12-24: qty 3

## 2020-12-24 MED ORDER — ACETAMINOPHEN 500 MG PO TABS: 500.0000 mg | ORAL_TABLET | Freq: Four times a day (QID) | ORAL | Status: DC | PRN

## 2020-12-24 MED ORDER — MOMETASONE FURO-FORMOTEROL FUM 100-5 MCG/ACT IN AERO
2.0000 | INHALATION_SPRAY | Freq: Two times a day (BID) | RESPIRATORY_TRACT | Status: DC
  Administered 2020-12-24 – 2020-12-25 (×2): 2 via RESPIRATORY_TRACT
  Filled 2020-12-24: qty 8.8

## 2020-12-24 MED ORDER — SPIRONOLACTONE 25 MG PO TABS
25.0000 mg | ORAL_TABLET | Freq: Every day | ORAL | Status: DC
  Administered 2020-12-24 – 2020-12-25 (×2): 25 mg via ORAL
  Filled 2020-12-24 (×2): qty 1

## 2020-12-24 MED ORDER — IPRATROPIUM-ALBUTEROL 0.5-2.5 (3) MG/3ML IN SOLN
3.0000 mL | Freq: Three times a day (TID) | RESPIRATORY_TRACT | Status: DC
  Administered 2020-12-25 (×2): 3 mL via RESPIRATORY_TRACT
  Filled 2020-12-24 (×2): qty 3

## 2020-12-24 MED ORDER — SACUBITRIL-VALSARTAN 97-103 MG PO TABS
0.5000 | ORAL_TABLET | Freq: Two times a day (BID) | ORAL | Status: DC
  Administered 2020-12-24 – 2020-12-25 (×2): 0.5 via ORAL
  Filled 2020-12-24 (×2): qty 0.5

## 2020-12-24 MED ORDER — FUROSEMIDE 10 MG/ML IJ SOLN
40.0000 mg | Freq: Once | INTRAMUSCULAR | Status: AC
  Administered 2020-12-24: 40 mg via INTRAVENOUS
  Filled 2020-12-24: qty 4

## 2020-12-24 MED ORDER — DAPAGLIFLOZIN PROPANEDIOL 10 MG PO TABS
10.0000 mg | ORAL_TABLET | Freq: Every day | ORAL | Status: DC
  Administered 2020-12-25: 10 mg via ORAL
  Filled 2020-12-24: qty 1

## 2020-12-24 MED ORDER — PANTOPRAZOLE SODIUM 40 MG PO TBEC
40.0000 mg | DELAYED_RELEASE_TABLET | Freq: Every day | ORAL | Status: DC
  Administered 2020-12-24 – 2020-12-25 (×2): 40 mg via ORAL
  Filled 2020-12-24 (×2): qty 1

## 2020-12-24 NOTE — ED Provider Notes (Signed)
[facility] DEPT Provider Note   CSN: [id number] Arrival date & time: 12/24/20  1016     History Chief Complaint  Patient presents with  . Shortness of Breath    Paul Gill. is a 80 y.o. male presenting for evaluation of shortness of breath.    Patient states for the past 4 days, he has had worsening shortness of breath.  It was especially bad last night.  Shortness of breath is worse with exertion and when laying flat, although is present also at rest.  He saw his primary care doctor a few weeks ago, was diagnosed with presumed pneumonia and started on Levaquin.  He finished these antibiotics last week.  He reports a continued cough, however fever resolved with antibiotics.  He states his doctor decreased his Lasix from 40 mg a day to 20 mg every other day due to worsening kidney function.  He denies chest pain, nausea, vomiting, abdominal pain, urinary symptoms, abnormal bowel movements.  No other recent medication changes.  He reports a history of pleural effusions, has needed thoracentesis x2, last one in 2018.  Additional history obtained from chart review.  Patient with a history of AAA, CAD, right upper lobe cancer status posttreatment, CHF, CKD, COPD, GERD, hypertension  HPI     Past Medical History:  Diagnosis Date  . AAA (abdominal aortic aneurysm) ([place]) 2010   4.4 cm 08/2008;4.44 in 7/10 and 4.65 in 08/2009; 4.8 by CT in 11/2009; 4.3 by ultrasound in 08/2010  . Anemia   . Arteriosclerotic cardiovascular disease (ASCVD) 1996   CABG-1996  . Arthritis    "fingers" (03/18/2014)  . CAD (coronary artery disease)    03/18/14:  PCI with DES to distal left main. 7/29: DES to the SVG to Diag  . Cancer ([place])    Upper right lobe lung cancer  . Cardiomyopathy, ischemic    Echo 03/17/14: EF 45-50%  . CHF (congestive heart failure) ([place])   . Chronic bronchitis ([place])   . Chronic kidney disease    CRF  . Chronic rhinitis   . Colonic polyp 2002    polypectomy in 2002  . COPD (chronic obstructive pulmonary disease) ([place])   . Diverticulosis   . Dyspnea    with exertion  . ED (erectile dysfunction)   . Encounter for antineoplastic chemotherapy 12/19/2015  . GERD (gastroesophageal reflux disease)   . History of blood transfusion   . Hyperlipidemia   . Hypertension   . IFG (impaired fasting glucose)   . Myocardial infarction [facility])    "told h/o silent MI sometime before 1996"  . Pneumonia ~ 2001; ~ 2005    has had more than twice  . Right bundle branch block   . Tobacco abuse, in remission    40 pack year total consumption; discontinued in 1996    Patient Active Problem List   Diagnosis Date Noted  . Acute on chronic systolic CHF (congestive heart failure) ([place]) 12/24/2020  . Cough 06/17/2020  . Bacterial colonization of lower respiratory tract 03/17/2019  . History of thoracic surgery 04/24/2017  . History of endovascular stent graft for abdominal aortic aneurysm (AAA) 01/02/2017  . S/P CABG (coronary artery bypass graft) 11/05/2016  . Cardiomyopathy, ischemic 11/05/2016  . Chronic combined systolic and diastolic congestive heart failure ([place]) 08/06/2016  . Pleural effusion   . Acute on chronic combined systolic and diastolic heart failure ([place])   . Hypoalbuminemia due to protein-calorie malnutrition ([place]) 02/21/2016  . Fever 02/21/2016  .  Peripheral edema 02/17/2016  . Acute on chronic renal failure ([place]) 02/05/2016  . HTN (hypertension) 02/05/2016  . AAA (abdominal aortic aneurysm) without rupture ([place]) 01/30/2016  . Primary cancer of right upper lobe of lung ([place]) 11/24/2015  . CKD (chronic kidney disease) stage 3, GFR 30-59 ml/min (HCC) 11/11/2015  . Thrombocytopenia ([place]) 02/17/2015  . Chronic obstructive airway disease with asthma ([place]) 02/17/2015  . Other and unspecified angina pectoris 03/18/2014  . 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 Right carotid bruit 07/21/2013  . CAD (coronary artery disease)  01/19/2013  . AAA (abdominal aortic aneurysm) ([place]) 05/06/2012  . Cerebrovascular disease 12/18/2011  . GERD (gastroesophageal reflux disease)   . Arteriosclerotic cardiovascular disease (ASCVD) 12/06/2009  . Elevated lipids 12/05/2009  . Essential hypertension 12/05/2009    Past Surgical History:  Procedure Laterality Date  . ABDOMINAL AORTIC ANEURYSM REPAIR  11/2012  . ABDOMINAL AORTIC ENDOVASCULAR STENT GRAFT N/A 12/11/2012   Procedure: ABDOMINAL AORTIC ENDOVASCULAR STENT GRAFT;  Surgeon: Mal Misty, MD;  Location: [facility];  Service: Vascular;  Laterality: N/A;  Ultrasound guided; [place]  . CARDIAC CATHETERIZATION  01/08/1995  . COLONOSCOPY  2002   polypectomy-patient denies  . CORONARY ANGIOPLASTY WITH STENT PLACEMENT  03/18/2014   "1"  . CORONARY ANGIOPLASTY WITH STENT PLACEMENT  03/24/2014   "1"  . CORONARY ARTERY BYPASS GRAFT  01/09/1995   "CABG X3"  . FLEXIBLE BRONCHOSCOPY N/A 03/01/2017   Procedure: FLEXIBLE BRONCHOSCOPY WITH BIOPSIES;  Surgeon: Gaye Pollack, MD;  Location: [facility] OR;  Service: Thoracic;  Laterality: N/A;  . JOINT REPLACEMENT    . LAPAROSCOPIC CHOLECYSTECTOMY  12/2009  . LEFT AND RIGHT HEART CATHETERIZATION WITH CORONARY/GRAFT ANGIOGRAM N/A 03/18/2014   Procedure: LEFT AND RIGHT HEART CATHETERIZATION WITH Beatrix Fetters;  Surgeon: Blane Ohara, MD;  Location: [facility] CATH LAB;  Service: Cardiovascular;  Laterality: N/A;  . PERCUTANEOUS CORONARY STENT INTERVENTION (PCI-S)  03/18/2014   Procedure: PERCUTANEOUS CORONARY STENT INTERVENTION (PCI-S);  Surgeon: Blane Ohara, MD;  Location: [facility] CATH LAB;  Service: Cardiovascular;;  . PERCUTANEOUS CORONARY STENT INTERVENTION (PCI-S) N/A 03/24/2014   Procedure: PERCUTANEOUS CORONARY STENT INTERVENTION (PCI-S);  Surgeon: Blane Ohara, MD;  Location: [facility] CATH LAB;  Service: Cardiovascular;  Laterality: N/A;  . PLEURAL EFFUSION DRAINAGE Right 03/01/2017   Procedure: DRAINAGE OF PLEURAL EFFUSION;  Surgeon: Gaye Pollack, MD;  Location: [facility];  Service: Thoracic;  Laterality: Right;  . RIGHT/LEFT HEART CATH AND CORONARY/GRAFT ANGIOGRAPHY N/A 05/15/2017   Procedure: RIGHT/LEFT HEART CATH AND CORONARY/GRAFT ANGIOGRAPHY;  Surgeon: Larey Dresser, MD;  Location: [facility] CV LAB;  Service: Cardiovascular;  Laterality: N/A;  . TALC PLEURODESIS Right 03/01/2017   Procedure: Pietro Cassis;  Surgeon: Gaye Pollack, MD;  Location: [facility];  Service: Thoracic;  Laterality: Right;  . TOTAL HIP ARTHROPLASTY Left 01/21/2013   Procedure: TOTAL HIP ARTHROPLASTY ANTERIOR APPROACH;  Surgeon: Mauri Pole, MD;  Location: [facility];  Service: Orthopedics;  Laterality: Left;  Marland Kitchen VIDEO ASSISTED THORACOSCOPY Right 03/01/2017   Procedure: VIDEO ASSISTED THORACOSCOPY WITH BIOPSIES;  Surgeon: Gaye Pollack, MD;  Location: [facility];  Service: Thoracic;  Laterality: Right;  Marland Kitchen VIDEO BRONCHOSCOPY N/A 11/17/2015   Procedure: VIDEO BRONCHOSCOPY WITH FLUORO;  Surgeon: Rigoberto Noel, MD;  Location: [facility];  Service: Cardiopulmonary;  Laterality: N/A;       Family History  Problem Relation Age of Onset  . Heart disease Father   . Cancer Father  Lung  . Arthritis Mother   . Parkinsonism Mother   . Arthritis Sister        Brother with rheumatoid arthritis  . Hypertension Brother   . Colon cancer Neg Hx   . Colon polyps Neg Hx     Social History   Tobacco Use  . Smoking status: Former Smoker    Packs/day: 1.50    Years: 30.00    Pack years: 45.00    Types: Cigarettes    Start date: 12/01/1956    Quit date: 01/08/1995    Years since quitting: 25.9  . Smokeless tobacco: Never Used  Vaping Use  . Vaping Use: Never used  Substance Use Topics  . Alcohol use: No    Alcohol/week: 0.0 standard drinks    Comment: 03/18/2014 "no alacohol since 1996"  . Drug use: No    Home Medications Prior to Admission medications   Medication Sig Start Date End Date Taking? Authorizing Provider  acetaminophen (TYLENOL) 500 MG tablet  Take 500 mg by mouth every 6 (six) hours as needed for mild pain.    [provider]  albuterol (PROAIR HFA) 108 (90 Base) MCG/ACT inhaler INHALE 2 PUFFS BY MOUTH EVERY 4 TO 6 HOURS AS NEEDED FOR WHEEZING. 03/09/20   Lovena Le, Malena M, DO  aspirin EC 81 MG tablet Take 81 mg by mouth daily before breakfast.     [provider]  carvedilol (COREG) 12.5 MG tablet Take 1 tablet (12.5 mg total) by mouth 2 (two) times daily with a meal. 12/13/20   Larey Dresser, MD  clopidogrel (PLAVIX) 75 MG tablet TAKE (1) TABLET BY MOUTH ONCE DAILY. 08/01/20   Larey Dresser, MD  dapagliflozin propanediol (FARXIGA) 10 MG TABS tablet Take 10 mg by mouth daily before breakfast. 08/27/19   Larey Dresser, MD  eplerenone (INSPRA) 50 MG tablet Take 1 tablet (50 mg total) by mouth every evening. 04/05/20   Larey Dresser, MD  fluconazole (DIFLUCAN) 100 MG tablet Take 1 tablet (100 mg total) by mouth daily. 12/14/20   Curt Bears, MD  fluticasone [facility]) 50 MCG/ACT nasal spray Place 1 spray into both nostrils daily. 06/17/20   Lauraine Rinne, NP  Fluticasone-Salmeterol (ADVAIR) 100-50 MCG/DOSE AEPB Inhale 1 puff into the lungs 2 (two) times daily.    [provider]  furosemide (LASIX) 20 MG tablet Take 1 tablet (20 mg total) by mouth every other day. 12/13/20   Larey Dresser, MD  ipratropium-albuterol (DUONEB) 0.5-2.5 (3) MG/3ML SOLN INHALE 1 VIAL VIA NEBULIZER 4 TIMES DAILY. 11/18/18   Rigoberto Noel, MD  levofloxacin (LEVAQUIN) 500 MG tablet Take one tablet (522m) by mouth daily for 10 days 12/08/20   ARigoberto Noel MD  Multiple Vitamins-Minerals (CENTRUM SILVER ADULT 50+) TABS Take 1 tablet by mouth daily with lunch.     [provider]  nitroGLYCERIN (NITROSTAT) 0.4 MG SL tablet PLACE 1 TAB UNDER TONGUE EVERY 5 MIN IF NEEDED FOR CHEST PAIN. MAY USE 3 TIMES.NO RELIEF CALL 911. 07/11/18   MLarey Dresser MD  omeprazole (PRILOSEC) 20 MG capsule TAKE ONE CAPSULE BY MOUTH DAILY.  10/19/19   LMikey Kirschner MD  rosuvastatin (CRESTOR) 20 MG tablet TAKE ONE TABLET BY MOUTH ONCE DAILY. 12/21/20   MLarey Dresser MD  sacubitril-valsartan (ENTRESTO) 49-51 MG TAKE (1) TABLET BY MOUTH TWICE DAILY. 11/21/18   SGeorgiana Shore NP    Allergies    Neomycin and Cetirizine & related  Review  of Systems   Review of Systems  Respiratory: Positive for cough and shortness of breath.   All other systems reviewed and are negative.   Physical Exam Updated Vital Signs BP 132/64   Pulse 77   Temp 97.8 F (36.6 C) (Oral)   Resp 17   Ht _0  (1.778 m)   Wt 84.2 kg   SpO2 98%   BMI 26.65 kg/m   Physical Exam Vitals and nursing note reviewed.  Constitutional:      General: He is not in acute distress.    Appearance: He is well-developed.     Comments: Nontoxic  HENT:     Head: Normocephalic and atraumatic.  Eyes:     Conjunctiva/sclera: Conjunctivae normal.     Pupils: Pupils are equal, round, and reactive to light.  Cardiovascular:     Rate and Rhythm: Normal rate and regular rhythm.     Pulses: Normal pulses.  Pulmonary:     Effort: Pulmonary effort is normal. No respiratory distress.     Breath sounds: Rhonchi present. No wheezing.     Comments: Rales in bilateral lower lobes, worse on the right Abdominal:     General: There is no distension.     Palpations: Abdomen is soft. There is no mass.     Tenderness: There is no abdominal tenderness. There is no guarding or rebound.  Musculoskeletal:        General: Normal range of motion.     Cervical back: Normal range of motion and neck supple.     Right lower leg: Edema present.     Left lower leg: Edema present.     Comments: Minimal pitting edema noted bilaterally  Skin:    General: Skin is warm and dry.     Capillary Refill: Capillary refill takes less than 2 seconds.  Neurological:     Mental Status: He is alert and oriented to person, place, and time.     ED Results / Procedures / Treatments    Labs (all labs ordered are listed, but only abnormal results are displayed) Labs Reviewed  CBC WITH DIFFERENTIAL/PLATELET - Abnormal; Notable for the following components:      Result Value   RBC 3.28 (*)    Hemoglobin 9.2 (*)    HCT 30.3 (*)    All other components within normal limits  COMPREHENSIVE METABOLIC PANEL - Abnormal; Notable for the following components:   Creatinine, Ser 1.37 (*)    Calcium 8.5 (*)    Albumin 3.3 (*)    GFR, Estimated 52 (*)    All other components within normal limits  BRAIN NATRIURETIC PEPTIDE - Abnormal; Notable for the following components:   B Natriuretic Peptide 1,753.0 (*)    All other components within normal limits  TROPONIN I (HIGH SENSITIVITY) - Abnormal; Notable for the following components:   Troponin I (High Sensitivity) 20 (*)    All other components within normal limits  TROPONIN I (HIGH SENSITIVITY) - Abnormal; Notable for the following components:   Troponin I (High Sensitivity) 19 (*)    All other components within normal limits  SARS CORONAVIRUS 2 (TAT 6-24 HRS)    EKG EKG Interpretation  Date/Time:  Saturday December 24 2020 10:31:30 EDT Ventricular Rate:  81 PR Interval:  174 QRS Duration: 148 QT Interval:  424 QTC Calculation: 492 R Axis:   90 Text Interpretation: Sinus rhythm with Premature atrial complexes Non-specific intra-ventricular conduction block Possible Right ventricular hypertrophy T wave abnormality, consider inferolateral  ischemia Abnormal ECG Confirmed by Dene Gentry [phone]) on 12/24/2020 11:19:37 AM   Radiology DG Chest 2 View  Result Date: 12/24/2020 CLINICAL DATA:  Shortness of breath. History of COPD and RIGHT lung cancer. EXAM: CHEST - 2 VIEW COMPARISON:  12/08/2020 chest radiograph and prior studies FINDINGS: Cardiomegaly and CABG changes again noted. RIGHT hemithorax volume loss, central RIGHT lung opacities and loculated RIGHT apical and basilar pleural effusions are unchanged. The LEFT lung is  clear. No pneumothorax or acute bony abnormality. IMPRESSION: No evidence of acute abnormality. Unchanged RIGHT lung opacities and loculated RIGHT pleural effusions. Electronically Signed   By: Margarette Canada M.D.   On: 12/24/2020 11:54    Procedures Procedures   Medications Ordered in ED Medications  furosemide (LASIX) injection 40 mg (40 mg Intravenous Given 12/24/20 1326)    ED Course  I have reviewed the triage vital signs and the nursing notes.  Pertinent labs & imaging results that were available during my care of the patient were reviewed by me and considered in my medical decision making (see chart for details).    MDM Rules/Calculators/A&P                          Patient presented for evaluation of worsening shortness of breath.  On exam, patient appears nontoxic.  He does have signs of acute on chronic heart failure and that he has increased dyspnea on exertion and orthopnea.  Clinically he has rales and pitting edema.  However also consider infectious cause although patient just finished a round of antibiotics.  Consider ACS, less likely without chest pain.  Less likely PE in the setting of normal vital signs, patient taking Plavix, and no chest pain.   Labs interpreted by me, shows elevated BNP from baseline.  Kidney function is improved from previous.  Troponin minimally elevated, this is likely due to demand.  Will trend.  Patient has anemia, similar to baseline.  Chest x-ray viewed and independently interpreted by me, shows large pleural pleural effusion on the right side.  Per radiology, this is similar to previous.  In the setting of worsening symptoms, large pleural effusion, and signs of acute on chronic heart failure, patient would likely benefit from admission for continued diuresis with close monitoring of kidney function.  He may benefit from a thoracentesis while admitted.  Discussed with Dr. Marylyn Ishihara from [facility] hospitalist service, patient to be admitted.  Requesting  consultation with PCCM for possible thoracentesis.  Discussed with Dr. Elsworth Soho from pulmonology and critical care, who defers thoracentesis at this time. states the patient needs it recommends IR consultation  Final Clinical Impression(s) / ED Diagnoses Final diagnoses:  Acute on chronic heart failure, unspecified heart failure type ([place])  Pleural effusion on right    Rx / DC Orders ED Discharge Orders    None       Franchot Heidelberg, PA-C 12/24/20 1441    Valarie Merino, MD 12/28/20 1845

## 2020-12-24 NOTE — ED Triage Notes (Signed)
Emergency Medicine Provider Triage Evaluation Note  Paul Sandoval. , a 80 y.o. male  was evaluated in triage.  Pt complains of shortness of breath.  Patient states he has had shortness of breath for 4 days.  Worsened last night.  Shortness of breath is worse when he lays flat and with exertion.  He does have a history of heart failure.  History of COPD and lung cancer.  He has needed thoracenteses in the past, last one in 2018.  He was recently (2 wks ago) treated for pneumonia with Levaquin and fluconazole, finished antibiotics but no change in cough.  He initially had fevers, no recurrence.  No chest pain.  Patient's doctor decreased his Lasix from 25 a day to 20 every other day due to worsening kidney function.  Review of Systems  Positive: Sob, doe, orthopnea, cough Negative: Fever, cp  Physical Exam  BP 135/69 (BP Location: Right Arm)   Pulse 85   Temp 97.8 F (36.6 C) (Oral)   Ht 5\' 10"  (1.778 m)   Wt 84.2 kg   SpO2 95%   BMI 26.65 kg/m  Gen:   Awake, no distress   HEENT:  Atraumatic  Resp:  Normal effort, crackles in lower lobes Cardiac:  Normal rate  Abd:   Nondistended, nontender  MSK:   Moves extremities without difficulty  Neuro:  Speech clear   Medical Decision Making  Medically screening exam initiated at 11:12 AM.  Appropriate orders placed.  Jolee Ewing. was informed that the remainder of the evaluation will be completed by another provider, this initial triage assessment does not replace that evaluation, and the importance of remaining in the ED until their evaluation is complete.  Clinical Impression   chf vs acs, vs pna, vs worsening CA. Will order labs and cxr   Franchot Heidelberg, PA-C 12/24/20 1114

## 2020-12-24 NOTE — ED Notes (Signed)
Hospitalist at bedside 

## 2020-12-24 NOTE — H&P (Signed)
History and Physical    Paul Sandoval. PZW:[id number] DOB: 06/13/1941 DOA: 12/24/2020  PCP: Erven Colla, DO  Patient coming from: Home  Chief Complaint: Orthopnea  HPI: Paul Leonhard. is a 80 y.o. male with medical history significant of CAD s/p CABG, COPD, NSCLC. Presenting with orthopnea. He reports that he's had worsening symptoms over the last 5 days. He usually uses one thick pillow to sleep at night. He's had to use an extra pillow up until last night. Last night he could not tolerate lying flat, so he slept in his recliner. Getting up and moving helps his symptoms. He didn't try any medications to help. He reports that his HF meds were changed around 10 days ago. He was on lasix 10m PO daily and coreg 18.719mPO BID. They were changed to lasix 2053mO q48hrs and coreg 12.5mg70mD. He has also noticed an increase in his BLE edema. Since his symptoms had not improved this morning, he decided to come to the ED. He denies any other aggravating or alleviating symptoms.    ED Course: Found to have a BNP of 1750. His troponins were flat at 20 and 19. His creatinine was 1.37. He was given lasix 40mg68m TRH was called for admission.   Review of Systems: Denies CP, palpitations, syncopal episodes, N/V/D, fever. Review of systems is otherwise negative for all not mentioned in HPI.   PMHx Past Medical History:  Diagnosis Date  . AAA (abdominal aortic aneurysm) (HCC) [place]   4.4 cm 08/2008;4.44 in 7/10 and 4.65 in 08/2009; 4.8 by CT in 11/2009; 4.3 by ultrasound in 08/2010  . Anemia   . Arteriosclerotic cardiovascular disease (ASCVD) 1996   CABG-1996  . Arthritis    "fingers" (03/18/2014)  . CAD (coronary artery disease)    03/18/14:  PCI with DES to distal left main. 7/29: DES to the SVG to Diag  . [facility]  . Cardiomyopathy, ischemic    Echo 03/17/14: EF 45-50%  . CHF (congestive heart failure) (HCC) [place] Chronic bronchitis (HCC) [place] Chronic  kidney disease    CRF  . Chronic rhinitis   . Colonic polyp 2002   polypectomy in 2002  . COPD (chronic obstructive pulmonary disease) (HCC) [place] Diverticulosis   . Dyspnea    with exertion  . ED (erectile dysfunction)   . Encounter for antineoplastic chemotherapy 12/19/2015  . GERD (gastroesophageal reflux disease)   . History of blood transfusion   . Hyperlipidemia   . Hypertension   . IFG (impaired fasting glucose)   . Myocardial infarction (HCC)[facility]"told h/o silent MI sometime before 1996"  . Pneumonia ~ 2001; ~ 2005    has had more than twice  . Right bundle branch block   . Tobacco abuse, in remission    40 pack year total consumption; discontinued in 1996    PSHx Past Surgical History:  Procedure Laterality Date  . ABDOMINAL AORTIC ANEURYSM REPAIR  11/2012  . ABDOMINAL AORTIC ENDOVASCULAR STENT GRAFT N/A 12/11/2012   Procedure: ABDOMINAL AORTIC ENDOVASCULAR STENT GRAFT;  Surgeon: JamesMal Misty  [facility];  Laterality: N/A;  Ultrasound guided; [place]  . CARDIAC CATHETERIZATION  01/08/1995  . COLONOSCOPY  2002   polypectomy-patient denies  . CORONARY ANGIOPLASTY WITH STENT PLACEMENT  03/18/2014   "1"  . CORONARY ANGIOPLASTY WITH STENT PLACEMENT  03/24/2014   "  1"  . CORONARY ARTERY BYPASS GRAFT  01/09/1995   "CABG X3"  . FLEXIBLE BRONCHOSCOPY N/A 03/01/2017   Procedure: FLEXIBLE BRONCHOSCOPY WITH BIOPSIES;  Surgeon: Gaye Pollack, MD;  Location: [facility] OR;  Service: Thoracic;  Laterality: N/A;  . JOINT REPLACEMENT    . LAPAROSCOPIC CHOLECYSTECTOMY  12/2009  . LEFT AND RIGHT HEART CATHETERIZATION WITH CORONARY/GRAFT ANGIOGRAM N/A 03/18/2014   Procedure: LEFT AND RIGHT HEART CATHETERIZATION WITH Beatrix Fetters;  Surgeon: Blane Ohara, MD;  Location[facility] C[facility] LAB;  Service: Cardiovascular;  Laterality: N/A;  . PERCUTANEOUS CORONARY STENT INTERVENTION (PCI-S)  03/18/2014   Procedure: PERCUTANEOUS CORONARY STENT INTERVENTION (PCI-S);  Surgeon:  Blane Ohara, MD;  Location: [facility] CATH LAB;  Service: Cardiovascular;;  . PERCUTANEOUS CORONARY STENT INTERVENTION (PCI-S) N/A 03/24/2014   Procedure: PERCUTANEOUS CORONARY STENT INTERVENTION (PCI-S);  Surgeon: Blane Ohara, MD;  Location: [facility] CATH LAB;  Service: Cardiovascular;  Laterality: N/A;  . PLEURAL EFFUSION DRAINAGE Right 03/01/2017   Procedure: DRAINAGE OF PLEURAL EFFUSION;  Surgeon: Gaye Pollack, MD;  Location: [place];  Service: Thoracic;  Laterality: Right;  . RIGHT/LEFT HEART CATH AND CORONARY/GRAFT ANGIOGRAPHY N/A 05/15/2017   Procedure: RIGHT/LEFT HEART CATH AND CORONARY/GRAFT ANGIOGRAPHY;  Surgeon: Larey Dresser, MD;  Location: [place] CV LAB;  Service: Cardiovascular;  Laterality: N/A;  . TALC PLEURODESIS Right 03/01/2017   Procedure: Pietro Cassis;  Surgeon: Gaye Pollack, MD;  Location: [place];  Service: Thoracic;  Laterality: Right;  . TOTAL HIP ARTHROPLASTY Left 01/21/2013   Procedure: TOTAL HIP ARTHROPLASTY ANTERIOR APPROACH;  Surgeon: Mauri Pole, MD;  Location: [place];  Service: Orthopedics;  Laterality: Left;  Marland Kitchen VIDEO ASSISTED THORACOSCOPY Right 03/01/2017   Procedure: VIDEO ASSISTED THORACOSCOPY WITH BIOPSIES;  Surgeon: Gaye Pollack, MD;  Location: [place];  Service: Thoracic;  Laterality: Right;  Marland Kitchen VIDEO BRONCHOSCOPY N/A 11/17/2015   Procedure: VIDEO BRONCHOSCOPY WITH FLUORO;  Surgeon: Rigoberto Noel, MD;  Location: [place];  Service: Cardiopulmonary;  Laterality: N/A;    SocHx  reports that he quit smoking about 25 years ago. His smoking use included cigarettes. He started smoking about 64 years ago. He has a 45.00 pack-year smoking history. He has never used smokeless tobacco. He reports that he does not drink alcohol and does not use drugs.  Allergies  Allergen Reactions  . Neomycin Hives  . Cetirizine & Related Rash    FamHx Family History  Problem Relation Age of Onset  . Heart disease Father   . Cancer Father        Lung  . Arthritis  Mother   . Parkinsonism Mother   . Arthritis Sister        Brother with rheumatoid arthritis  . Hypertension Brother   . Colon cancer Neg Hx   . Colon polyps Neg Hx     Prior to Admission medications   Medication Sig Start Date End Date Taking? Authorizing Provider  acetaminophen (TYLENOL) 500 MG tablet Take 500 mg by mouth every 6 (six) hours as needed for mild pain.    [provider]  albuterol (PROAIR HFA) 108 (90 Base) MCG/ACT inhaler INHALE 2 PUFFS BY MOUTH EVERY 4 TO 6 HOURS AS NEEDED FOR WHEEZING. 03/09/20   Lovena Le, Malena M, DO  aspirin EC 81 MG tablet Take 81 mg by mouth daily before breakfast.     [provider]  carvedilol (COREG) 12.5 MG tablet Take 1 tablet (12.5 mg total) by mouth 2 (two) times daily  with a meal. 12/13/20   Larey Dresser, MD  clopidogrel (PLAVIX) 75 MG tablet TAKE (1) TABLET BY MOUTH ONCE DAILY. 08/01/20   Larey Dresser, MD  dapagliflozin propanediol (FARXIGA) 10 MG TABS tablet Take 10 mg by mouth daily before breakfast. 08/27/19   Larey Dresser, MD  eplerenone (INSPRA) 50 MG tablet Take 1 tablet (50 mg total) by mouth every evening. 04/05/20   Larey Dresser, MD  fluconazole (DIFLUCAN) 100 MG tablet Take 1 tablet (100 mg total) by mouth daily. 12/14/20   Curt Bears, MD  fluticasone [facility]) 50 MCG/ACT nasal spray Place 1 spray into both nostrils daily. 06/17/20   Lauraine Rinne, NP  Fluticasone-Salmeterol (ADVAIR) 100-50 MCG/DOSE AEPB Inhale 1 puff into the lungs 2 (two) times daily.    [provider]  furosemide (LASIX) 20 MG tablet Take 1 tablet (20 mg total) by mouth every other day. 12/13/20   Larey Dresser, MD  ipratropium-albuterol (DUONEB) 0.5-2.5 (3) MG/3ML SOLN INHALE 1 VIAL VIA NEBULIZER 4 TIMES DAILY. 11/18/18   Rigoberto Noel, MD  levofloxacin (LEVAQUIN) 500 MG tablet Take one tablet (529m) by mouth daily for 10 days 12/08/20   ARigoberto Noel MD  Multiple Vitamins-Minerals (CENTRUM SILVER ADULT 50+) TABS  Take 1 tablet by mouth daily with lunch.     [provider]  nitroGLYCERIN (NITROSTAT) 0.4 MG SL tablet PLACE 1 TAB UNDER TONGUE EVERY 5 MIN IF NEEDED FOR CHEST PAIN. MAY USE 3 TIMES.NO RELIEF CALL 911. 07/11/18   MLarey Dresser MD  omeprazole (PRILOSEC) 20 MG capsule TAKE ONE CAPSULE BY MOUTH DAILY. 10/19/19   LMikey Kirschner MD  rosuvastatin (CRESTOR) 20 MG tablet TAKE ONE TABLET BY MOUTH ONCE DAILY. 12/21/20   MLarey Dresser MD  sacubitril-valsartan (ENTRESTO) 49-51 MG TAKE (1) TABLET BY MOUTH TWICE DAILY. 11/21/18   SGeorgiana Shore NP    Physical Exam: Vitals:   12/24/20 1024 12/24/20 1038 12/24/20 1243 12/24/20 1300  BP: 135/69  131/66 132/64  Pulse: 85  75 77  Resp:   16 17  Temp: 97.8 F (36.6 C)     TempSrc: Oral     SpO2: 95%  100% 98%  Weight:  84.2 kg    Height:  _0  (1.778 m)      General: 80y.o. male resting in bed in NAD Eyes: PERRL, normal sclera ENMT: Nares patent w/o discharge, orophaynx clear, dentition normal, ears w/o discharge/lesions/ulcers Neck: Supple, trachea midline Cardiovascular: RRR, +S1, S2, no m/g/r, equal pulses throughout Respiratory: slight exp wheeze at left base, soft rhonchi at right base, normal WOB on RA GI: BS+, NDNT, no masses noted, no organomegaly noted MSK: No c/c, BLE 3+ pitting ankle edema Skin: No rashes, bruises, ulcerations noted Neuro: A&O x 3, no focal deficits Psyc: Appropriate interaction and affect, calm/cooperative  Labs on Admission: I have personally reviewed following labs and imaging studies  CBC: Recent Labs  Lab 12/24/20 1205  WBC 7.1  NEUTROABS 5.6  HGB 9.2*  HCT 30.3*  MCV 92.4  PLT 1785  Basic Metabolic Panel: Recent Labs  Lab 12/19/20 0851 12/24/20 1205  NA 138 139  K 4.2 4.3  CL 105 105  CO2 26 27  GLUCOSE 97 99  BUN 17 14  CREATININE 1.48* 1.37*  CALCIUM 8.8* 8.5*   GFR: Estimated Creatinine Clearance: 43.7 mL/min (A) (by C-G formula based on SCr of 1.37 mg/dL  (H)). Liver Function Tests: Recent Labs  Lab  12/24/20 1205  AST 16  ALT 12  ALKPHOS 75  BILITOT 0.3  PROT 6.9  ALBUMIN 3.3*   No results for input(s): LIPASE, AMYLASE in the last 168 hours. No results for input(s): AMMONIA in the last 168 hours. Coagulation Profile: No results for input(s): INR, PROTIME in the last 168 hours. Cardiac Enzymes: No results for input(s): CKTOTAL, CKMB, CKMBINDEX, TROPONINI in the last 168 hours. BNP (last 3 results) No results for input(s): PROBNP in the last 8760 hours. HbA1C: No results for input(s): HGBA1C in the last 72 hours. CBG: No results for input(s): GLUCAP in the last 168 hours. Lipid Profile: No results for input(s): CHOL, HDL, LDLCALC, TRIG, CHOLHDL, LDLDIRECT in the last 72 hours. Thyroid Function Tests: No results for input(s): TSH, T4TOTAL, FREET4, T3FREE, THYROIDAB in the last 72 hours. Anemia Panel: No results for input(s): VITAMINB12, FOLATE, FERRITIN, TIBC, IRON, RETICCTPCT in the last 72 hours. Urine analysis:    Component Value Date/Time   COLORURINE YELLOW 12/08/2020 1300   APPEARANCEUR HAZY (A) 12/08/2020 1300   LABSPEC 1.011 12/08/2020 1300   LABSPEC 1.010 03/28/2016 1244   PHURINE 5.0 12/08/2020 1300   GLUCOSEU 50 (A) 12/08/2020 1300   GLUCOSEU Negative 03/28/2016 1244   HGBUR NEGATIVE 12/08/2020 1300   BILIRUBINUR NEGATIVE 12/08/2020 1300   BILIRUBINUR Negative 03/28/2016 1244   KETONESUR NEGATIVE 12/08/2020 1300   PROTEINUR NEGATIVE 12/08/2020 1300   UROBILINOGEN 0.2 03/28/2016 1244   NITRITE NEGATIVE 12/08/2020 1300   LEUKOCYTESUR NEGATIVE 12/08/2020 1300   LEUKOCYTESUR Trace 03/28/2016 1244    Radiological Exams on Admission: DG Chest 2 View  Result Date: 12/24/2020 CLINICAL DATA:  Shortness of breath. History of COPD and RIGHT lung cancer. EXAM: CHEST - 2 VIEW COMPARISON:  12/08/2020 chest radiograph and prior studies FINDINGS: Cardiomegaly and CABG changes again noted. RIGHT hemithorax volume loss,  central RIGHT lung opacities and loculated RIGHT apical and basilar pleural effusions are unchanged. The LEFT lung is clear. No pneumothorax or acute bony abnormality. IMPRESSION: No evidence of acute abnormality. Unchanged RIGHT lung opacities and loculated RIGHT pleural effusions. Electronically Signed   By: Margarette Canada M.D.   On: 12/24/2020 11:54    EKG: Independently reviewed. Sinus, no st elevations  Assessment/Plan Acute on chronic combined HF     - place in obs     - received lasix 29m IV in ED; home regimen was 460mday but recently moved down to 2051mOD d/t renal function     - Baseline Scr is in the 1.7 - 1.8 range     - lets see how his response ON to his IV dose he got in the ED; if stable in AM, go home on his old dose of lasix 75m62m qday     - echo, daily wt, I&O     - no chest pain, trp flat, EKG ok     - continue coreg, entresto, lasix, eplerenone  COPD     - continue home inhalers, follow  HTN     - continue coreg, entresto, lasix, eplerenone  HLD     - continue home statin  CAD s/p CABG     - continue home ASA, plavix  GERD     - protonix  Hx of NSCLC Chronic loculated effusion     - continue outpt follow up with pulm  CKD3a     - his Scr is better than baseline     - continue home meds, watch renal fxn  DVT prophylaxis:  lovenox  Code Status: FULL  Family Communication: w/ wife at bedside  Consults called: None   Status is: Observation  The patient remains OBS appropriate and will d/c before 2 midnights.  Dispo: The patient is from: Home              Anticipated d/c is to: Home              Patient currently is not medically stable to d/c.   Difficult to place patient No  Time spent coordinating admission: 75 minutes  [facility]  If 7PM-7AM, please contact night-coverage [email or web address]  12/24/2020, 2:00 PM

## 2020-12-24 NOTE — ED Notes (Signed)
Patient ambulatory in room without difficulty. Oxygen saturation remained 93-99% while ambulating.

## 2020-12-24 NOTE — ED Triage Notes (Signed)
Patient reports he has been short of breath x 4 nights. Says it was worse last night to wear he had to sleep In recliner. Patient says he had same issue in 2017 and was diagnosed with lung cancer and copd. Patient denies pain

## 2020-12-25 ENCOUNTER — Observation Stay (HOSPITAL_BASED_OUTPATIENT_CLINIC_OR_DEPARTMENT_OTHER): Payer: Medicare Other

## 2020-12-25 ENCOUNTER — Encounter (HOSPITAL_COMMUNITY): Payer: Self-pay | Admitting: Internal Medicine

## 2020-12-25 DIAGNOSIS — I5023 Acute on chronic systolic (congestive) heart failure: Secondary | ICD-10-CM

## 2020-12-25 DIAGNOSIS — I509 Heart failure, unspecified: Secondary | ICD-10-CM | POA: Diagnosis not present

## 2020-12-25 DIAGNOSIS — J90 Pleural effusion, not elsewhere classified: Secondary | ICD-10-CM | POA: Diagnosis not present

## 2020-12-25 LAB — COMPREHENSIVE METABOLIC PANEL
ALT: 12 U/L (ref 0–44)
AST: 16 U/L (ref 15–41)
Albumin: 3.4 g/dL — ABNORMAL LOW (ref 3.5–5.0)
Alkaline Phosphatase: 72 U/L (ref 38–126)
Anion gap: 8 (ref 5–15)
BUN: 17 mg/dL (ref 8–23)
CO2: 31 mmol/L (ref 22–32)
Calcium: 8.8 mg/dL — ABNORMAL LOW (ref 8.9–10.3)
Chloride: 100 mmol/L (ref 98–111)
Creatinine, Ser: 1.51 mg/dL — ABNORMAL HIGH (ref 0.61–1.24)
GFR, Estimated: 46 mL/min — ABNORMAL LOW (ref >60)
Glucose, Bld: 97 mg/dL (ref 70–99)
Potassium: 3.6 mmol/L (ref 3.5–5.1)
Sodium: 139 mmol/L (ref 135–145)
Total Bilirubin: 0.6 mg/dL (ref 0.3–1.2)
Total Protein: 7.3 g/dL (ref 6.5–8.1)

## 2020-12-25 LAB — ECHOCARDIOGRAM COMPLETE
Area-P 1/2: 5.13 cm2
Height: 70 in
S' Lateral: 4.1 cm
Weight: 2864.22 oz

## 2020-12-25 LAB — CBC
HCT: 32.5 % — ABNORMAL LOW (ref 39.0–52.0)
Hemoglobin: 9.8 g/dL — ABNORMAL LOW (ref 13.0–17.0)
MCH: 27.8 pg (ref 26.0–34.0)
MCHC: 30.2 g/dL (ref 30.0–36.0)
MCV: 92.1 fL (ref 80.0–100.0)
Platelets: 164 10*3/uL (ref 150–400)
RBC: 3.53 MIL/uL — ABNORMAL LOW (ref 4.22–5.81)
RDW: 14.9 % (ref 11.5–15.5)
WBC: 6.4 10*3/uL (ref 4.0–10.5)
nRBC: 0 % (ref 0.0–0.2)

## 2020-12-25 MED ORDER — FUROSEMIDE 40 MG PO TABS: 40.0000 mg | ORAL_TABLET | Freq: Every day | ORAL | 1 refills | Status: DC

## 2020-12-25 MED ORDER — PERFLUTREN LIPID MICROSPHERE
1.0000 mL | INTRAVENOUS | Status: AC | PRN
  Administered 2020-12-25: 2 mL via INTRAVENOUS
  Filled 2020-12-25: qty 10

## 2020-12-25 NOTE — Discharge Summary (Signed)
Physician Discharge Summary  Patient ID: Paul Sandoval. MRN: [id number] DOB/AGE: [birth year] 80 y.o.  Admit date: 12/24/2020 Discharge date: 12/25/2020  Admission Diagnoses:  Discharge Diagnoses:  Active Problems:   Acute on chronic systolic CHF (congestive heart failure) [facility])   Discharged Condition: stable  Hospital Course: Patient is an 80 year old male with past medical history significant for CAD s/p CABG, COPD and NSCLC.  Patient presented with orthopnea, bilateral lower extremity edema and paroxysmal nocturnal dyspnea.  Apparently, patient's heart failure medications were changed about 10 days prior to presentation.  Was decreased from 40 Mg p.o. once daily to 20 Mg p.o. once daily.  Patient was admitted and managed for acute on chronic combined systolic and diastolic heart failure.   Acute on chronic combined systolic or diastolic heart failure:      -patient was admitted and managed with IV Lasix.      -echo findings are as documented above.  Chest x-ray did not reveal any acute findings.     - continued on coreg, entresto, lasix, eplerenone  COPD:     - continued home inhalers.  Hypertension:     - continued coreg, entresto, lasix, eplerenone  Hyperlipidemia:     - continue home statin  CAD s/p CABG:     - continued home ASA, plavix  GERD:     - protonix  Hx of NSCLC: Chronic loculated effusion     - continue outpt follow up with pulmonary team  CKD3a:     -Stable     - continue to monitor renal function on discharge.   Consults: None  Significant Diagnostic Studies:  -Cardiac BNP was elevated at 1753.  Echocardiogram revealed: 1. Left ventricular ejection fraction, by estimation, is 30 to 35%. The  left ventricle has moderately decreased function. The left ventricle has  no regional wall motion abnormalities. Left ventricular diastolic  parameters are consistent with Grade I  diastolic dysfunction (impaired relaxation).  2. Right  ventricular systolic function is normal. The right ventricular  size is normal.  3. Left atrial size was mildly dilated.  4. The mitral valve is normal in structure. No evidence of mitral valve  regurgitation. No evidence of mitral stenosis.  5. The aortic valve is tricuspid. Aortic valve regurgitation is not  visualized. Mild aortic valve sclerosis is present, with no evidence of  aortic valve stenosis.  6. The inferior vena cava is dilated in size with >50% respiratory  variability, suggesting right atrial pressure of 8 mmHg.   Comparison(s): The left ventricular function is unchanged. Prior EF 35%  10/06/19.   Chest x-ray: No evidence of acute abnormality.   Discharge Exam: Blood pressure (!) 118/55, pulse 70, temperature 97.8 F (36.6 C), temperature source Oral, resp. rate 17, height 5\' 10"  (1.778 m), weight 81.2 kg, SpO2 98 %.  Disposition: Discharge disposition: 01-Home or Self Care   Discharge Instructions    Diet - low sodium heart healthy   Complete by: As directed    Increase activity slowly   Complete by: As directed      Allergies as of 12/25/2020      Reactions   Neomycin Hives   Cetirizine & Related Rash      Medication List    STOP taking these medications   fluconazole 100 MG tablet Commonly known as: DIFLUCAN   levofloxacin 500 MG tablet Commonly known as: LEVAQUIN     TAKE these medications   acetaminophen 500 MG tablet Commonly known as: TYLENOL Take  500 mg by mouth every 6 (six) hours as needed for mild pain.   albuterol 108 (90 Base) MCG/ACT inhaler Commonly known as: ProAir HFA INHALE 2 PUFFS BY MOUTH EVERY 4 TO 6 HOURS AS NEEDED FOR WHEEZING.   aspirin EC 81 MG tablet Take 81 mg by mouth daily before breakfast.   carvedilol 12.5 MG tablet Commonly known as: COREG Take 1 tablet (12.5 mg total) by mouth 2 (two) times daily with a meal.   Centrum Silver Adult 50+ Tabs Take 1 tablet by mouth daily with lunch.   clopidogrel 75 MG  tablet Commonly known as: PLAVIX TAKE (1) TABLET BY MOUTH ONCE DAILY.   dapagliflozin propanediol 10 MG Tabs tablet Commonly known as: FARXIGA Take 10 mg by mouth daily before breakfast.   eplerenone 50 MG tablet Commonly known as: INSPRA Take 1 tablet (50 mg total) by mouth every evening.   fluticasone 50 MCG/ACT nasal spray Commonly known as: FLONASE Place 1 spray into both nostrils daily as needed for allergies or rhinitis.   fluticasone 50 MCG/ACT nasal spray Commonly known as: FLONASE Place 1 spray into both nostrils daily.   Fluticasone-Salmeterol 100-50 MCG/DOSE Aepb Commonly known as: ADVAIR Inhale 1 puff into the lungs 2 (two) times daily.   furosemide 40 MG tablet Commonly known as: LASIX Take 1 tablet (40 mg total) by mouth daily. Start taking on: Dec 26, 2020 What changed:   medication strength  how much to take  when to take this   ipratropium-albuterol 0.5-2.5 (3) MG/3ML Soln Commonly known as: DUONEB INHALE 1 VIAL VIA NEBULIZER 4 TIMES DAILY.   nitroGLYCERIN 0.4 MG SL tablet Commonly known as: Nitrostat PLACE 1 TAB UNDER TONGUE EVERY 5 MIN IF NEEDED FOR CHEST PAIN. MAY USE 3 TIMES.NO RELIEF CALL 911.   omeprazole 20 MG capsule Commonly known as: PRILOSEC TAKE ONE CAPSULE BY MOUTH DAILY.   rosuvastatin 20 MG tablet Commonly known as: CRESTOR TAKE ONE TABLET BY MOUTH ONCE DAILY.   sacubitril-valsartan 97-103 MG Commonly known as: ENTRESTO Take 0.5 tablets by mouth 2 (two) times daily. What changed: Another medication with the same name was removed. Continue taking this medication, and follow the directions you see here.        SignedBonnell Public 12/25/2020, 1:08 PM

## 2020-12-25 NOTE — Progress Notes (Signed)
Patient given discharge, follow up, and medication instructions, verbalized understanding, IV and telemetry monitor removed, personal belongings with patient, family to transport home

## 2020-12-25 NOTE — Plan of Care (Signed)
  Problem: Education: Goal: Knowledge of General Education information will improve Description: Including pain rating scale, medication(s)/side effects and non-pharmacologic comfort measures Outcome: Progressing   Problem: Clinical Measurements: Goal: Will remain free from infection Outcome: Progressing Goal: Diagnostic test results will improve Outcome: Progressing   Problem: Activity: Goal: Risk for activity intolerance will decrease Outcome: Progressing   

## 2020-12-25 NOTE — Plan of Care (Signed)

## 2020-12-25 NOTE — Progress Notes (Signed)
  Echocardiogram 2D Echocardiogram has been performed.  Reva Pinkley G Tashera Montalvo 12/25/2020, 9:58 AM

## 2020-12-25 NOTE — Progress Notes (Signed)
I agree with the previous RN's assessment.

## 2020-12-26 ENCOUNTER — Other Ambulatory Visit (HOSPITAL_COMMUNITY): Payer: Medicare Other

## 2020-12-26 DIAGNOSIS — J449 Chronic obstructive pulmonary disease, unspecified: Secondary | ICD-10-CM | POA: Diagnosis not present

## 2020-12-27 ENCOUNTER — Telehealth (HOSPITAL_COMMUNITY): Payer: Self-pay | Admitting: Cardiology

## 2020-12-27 MED ORDER — CARVEDILOL 12.5 MG PO TABS: 18.7500 mg | ORAL_TABLET | Freq: Two times a day (BID) | ORAL | 3 refills | Status: DC

## 2020-12-27 NOTE — Telephone Encounter (Signed)
Returned call to pt he stated on 4/19 Dr.McLean decreased his carvedilol. Pt said his systolic bp has recently been averaging 130. Pt wanted to know if he should go back to old dose because he has a history of his blood pressure slowly increasing. Routed to [place] for advice.

## 2020-12-27 NOTE — Telephone Encounter (Signed)
He can increase Coreg back to prior dose 18.75 mg bid.  He also should have restarted his old dose of Lasix 40 mg daily since his recent admission.

## 2020-12-27 NOTE — Telephone Encounter (Signed)
Pt had a medication change recently, and his BP has increased , pt would like a medication consult, [phone]

## 2020-12-27 NOTE — Telephone Encounter (Signed)
Pt aware and agreeable with plan. Pt stated he is taking lasix as prescribed.

## 2021-01-03 ENCOUNTER — Other Ambulatory Visit: Payer: Self-pay | Admitting: Family Medicine

## 2021-01-04 NOTE — Telephone Encounter (Signed)
Pt made med check appt for 05/12 at 10:40 he is out of meds   Pt call back [phone]

## 2021-01-04 NOTE — Telephone Encounter (Signed)
Needs appt then route back to send in refill

## 2021-01-05 ENCOUNTER — Telehealth: Payer: Self-pay | Admitting: Family Medicine

## 2021-01-05 ENCOUNTER — Ambulatory Visit (INDEPENDENT_AMBULATORY_CARE_PROVIDER_SITE_OTHER): Payer: Medicare Other | Admitting: Family Medicine

## 2021-01-05 ENCOUNTER — Other Ambulatory Visit: Payer: Self-pay

## 2021-01-05 VITALS — BP 102/64 | HR 83 | Temp 97.8°F | Ht 70.0 in | Wt 183.0 lb

## 2021-01-05 DIAGNOSIS — J449 Chronic obstructive pulmonary disease, unspecified: Secondary | ICD-10-CM

## 2021-01-05 DIAGNOSIS — N179 Acute kidney failure, unspecified: Secondary | ICD-10-CM | POA: Diagnosis not present

## 2021-01-05 DIAGNOSIS — I714 Abdominal aortic aneurysm, without rupture, unspecified: Secondary | ICD-10-CM

## 2021-01-05 DIAGNOSIS — C3411 Malignant neoplasm of upper lobe, right bronchus or lung: Secondary | ICD-10-CM | POA: Diagnosis not present

## 2021-01-05 DIAGNOSIS — N189 Chronic kidney disease, unspecified: Secondary | ICD-10-CM | POA: Diagnosis not present

## 2021-01-05 DIAGNOSIS — D649 Anemia, unspecified: Secondary | ICD-10-CM | POA: Diagnosis not present

## 2021-01-05 DIAGNOSIS — I5043 Acute on chronic combined systolic (congestive) and diastolic (congestive) heart failure: Secondary | ICD-10-CM | POA: Diagnosis not present

## 2021-01-05 DIAGNOSIS — J4489 Other specified chronic obstructive pulmonary disease: Secondary | ICD-10-CM

## 2021-01-05 MED ORDER — OMEPRAZOLE 20 MG PO CPDR: 1.0000 | DELAYED_RELEASE_CAPSULE | Freq: Every day | ORAL | 1 refills | Status: DC

## 2021-01-05 NOTE — Progress Notes (Signed)
Patient ID: Paul Sandoval., male    DOB: Apr 08, 1941, 80 y.o.   MRN: [id number]   Chief Complaint  Patient presents with  . Hospitalization Follow-up   Subjective:    HPI  Hospital follow up- Pt had dc from ER and observation. Admitted 12/24/20-12/25/20.  Pt went to hospital and had admission on 12/24/20- had acute on chronic HF. Reduced the lasix due to Cr had increased, then the fluid came back up. Cardiology- restarted the lasix. Go back to 40mg  daily for lasix, per cardiology due to his CHF. Normally urinating 8x per day, now at 12, in past week now down to 9 x bathroom. Cr was inc to 2.2 then dec to 1.5. on last set of labs.  Anemia- Hb 9.8, hct-32.5  Seeing cardiology, now every 4 mos and and HF doctor. See them every 82mo, HF group. Seeing pulm doctor also and using neb machine 3-4x per day. Getting some medications from the [facility]- taking crestor.   Wearing compression stockings. Seeing the oncology group also. When has green sputum and yellow, then will we will give abx.   Medical History Paul Sandoval has a past medical history of AAA (abdominal aortic aneurysm) ([place]) (2010), Anemia, Arteriosclerotic cardiovascular disease (ASCVD) (1996), Arthritis, CAD (coronary artery disease), Cancer ([place]), Cardiomyopathy, ischemic, CHF (congestive heart failure) ([place]), Chronic bronchitis ([place]), Chronic kidney disease, Chronic rhinitis, Colonic polyp (2002), COPD (chronic obstructive pulmonary disease) ([place]), Diverticulosis, Dyspnea, ED (erectile dysfunction), Encounter for antineoplastic chemotherapy (12/19/2015), GERD (gastroesophageal reflux disease), History of blood transfusion, Hyperlipidemia, Hypertension, IFG (impaired fasting glucose), Myocardial infarction ([place]), Pneumonia (~ 2001; ~ 2005), Right bundle branch block, and Tobacco abuse, in remission.   Outpatient Encounter Medications as of 01/05/2021  Medication Sig  . albuterol (PROAIR HFA) 108 (90 Base) MCG/ACT inhaler INHALE  2 PUFFS BY MOUTH EVERY 4 TO 6 HOURS AS NEEDED FOR WHEEZING.  Marland Kitchen aspirin EC 81 MG tablet Take 81 mg by mouth daily before breakfast.   . carvedilol (COREG) 12.5 MG tablet Take 1.5 tablets (18.75 mg total) by mouth 2 (two) times daily with a meal.  . clopidogrel (PLAVIX) 75 MG tablet TAKE (1) TABLET BY MOUTH ONCE DAILY.  . dapagliflozin propanediol (FARXIGA) 10 MG TABS tablet Take 10 mg by mouth daily before breakfast.  . eplerenone (INSPRA) 50 MG tablet Take 1 tablet (50 mg total) by mouth every evening.  . Fluticasone-Salmeterol (ADVAIR) 100-50 MCG/DOSE AEPB Inhale 1 puff into the lungs 2 (two) times daily.  . furosemide (LASIX) 40 MG tablet Take 1 tablet (40 mg total) by mouth daily.  Marland Kitchen ipratropium-albuterol (DUONEB) 0.5-2.5 (3) MG/3ML SOLN INHALE 1 VIAL VIA NEBULIZER 4 TIMES DAILY.  . Multiple Vitamins-Minerals (CENTRUM SILVER ADULT 50+) TABS Take 1 tablet by mouth daily with lunch.   . rosuvastatin (CRESTOR) 20 MG tablet TAKE ONE TABLET BY MOUTH ONCE DAILY.  . sacubitril-valsartan (ENTRESTO) 97-103 MG Take 0.5 tablets by mouth 2 (two) times daily.  . [DISCONTINUED] acetaminophen (TYLENOL) 500 MG tablet Take 500 mg by mouth every 6 (six) hours as needed for mild pain.  . [DISCONTINUED] omeprazole (PRILOSEC) 20 MG capsule TAKE ONE CAPSULE BY MOUTH DAILY.  . nitroGLYCERIN (NITROSTAT) 0.4 MG SL tablet PLACE 1 TAB UNDER TONGUE EVERY 5 MIN IF NEEDED FOR CHEST PAIN. MAY USE 3 TIMES.NO RELIEF CALL 911. (Patient not taking: Reported on 01/05/2021)  . omeprazole (PRILOSEC) 20 MG capsule Take 1 capsule (20 mg total) by mouth daily.  . [DISCONTINUED] fluticasone (FLONASE) 50 MCG/ACT nasal  spray Place 1 spray into both nostrils daily.  . [DISCONTINUED] fluticasone (FLONASE) 50 MCG/ACT nasal spray Place 1 spray into both nostrils daily as needed for allergies or rhinitis.   No facility-administered encounter medications on file as of 01/05/2021.     Review of Systems  Constitutional: Negative for chills  and fever.  HENT: Negative for congestion, rhinorrhea and sore throat.   Respiratory: Negative for cough, shortness of breath and wheezing.   Cardiovascular: Negative for chest pain and leg swelling.  Gastrointestinal: Negative for abdominal pain, diarrhea, nausea and vomiting.  Genitourinary: Negative for dysuria and frequency.  Skin: Negative for rash.  Neurological: Negative for dizziness, weakness and headaches.     Vitals BP 102/64   Pulse 83   Temp 97.8 F (36.6 C)   Ht 5\' 10"  (1.778 m)   Wt 183 lb (83 kg)   SpO2 97%   BMI 26.26 kg/m   Objective:   Physical Exam Vitals and nursing note reviewed.  Constitutional:      General: He is not in acute distress.    Appearance: Normal appearance. He is not ill-appearing.  HENT:     Head: Normocephalic.     Nose: Nose normal. No congestion.     Mouth/Throat:     Mouth: Mucous membranes are moist.     Pharynx: No oropharyngeal exudate.  Eyes:     Extraocular Movements: Extraocular movements intact.     Conjunctiva/sclera: Conjunctivae normal.     Pupils: Pupils are equal, round, and reactive to light.  Cardiovascular:     Rate and Rhythm: Normal rate and regular rhythm.     Pulses: Normal pulses.     Heart sounds: Normal heart sounds. No murmur heard.   Pulmonary:     Effort: Pulmonary effort is normal.     Breath sounds: Normal breath sounds. No wheezing, rhonchi or rales.  Musculoskeletal:        General: Normal range of motion.     Right lower leg: No edema.     Left lower leg: No edema.  Skin:    General: Skin is warm and dry.     Findings: No rash.  Neurological:     General: No focal deficit present.     Mental Status: He is alert and oriented to person, place, and time.     Cranial Nerves: No cranial nerve deficit.  Psychiatric:        Mood and Affect: Mood normal.        Behavior: Behavior normal.        Thought Content: Thought content normal.        Judgment: Judgment normal.      Assessment  and Plan   1. Acute renal failure superimposed on chronic kidney disease, unspecified CKD stage, unspecified acute renal failure type [facility]) - Ambulatory referral to Nephrology  2. Chronic obstructive airway disease with asthma ([place])  3. Primary cancer of right upper lobe of lung ([place])  4. Abdominal aortic aneurysm (AAA) without rupture ([place])  5. Acute on chronic combined systolic and diastolic heart failure ([place])  6. Anemia, unspecified type - Ambulatory referral to Nephrology   CKD- f/u nephrology- Cr improved from 4/22 was 2.32 and now at 1.51.  bp stable  Anemia- -recommending taking iron daily otc to help with iron def anemia. Iron panel done 12/14/20 while in hospital.  Hb at 9.8 and hct 32.   chf- stable, improved.  recommending cont with chf meds and f/u cardiology.  Copd- stable. Cont f/u with pulm and cont inhalers.  H/o AAA in 2017 and primary lung cancer in 2017- cont to f/u with specialist.  Return in about 6 months (around 07/08/2021) for f/u htn, copd.   01/16/2021

## 2021-01-05 NOTE — Telephone Encounter (Signed)
Left message to return call 

## 2021-01-06 ENCOUNTER — Telehealth: Payer: Self-pay

## 2021-01-06 ENCOUNTER — Telehealth (HOSPITAL_COMMUNITY): Payer: Self-pay | Admitting: *Deleted

## 2021-01-06 ENCOUNTER — Telehealth: Payer: Self-pay | Admitting: Medical Oncology

## 2021-01-06 NOTE — Telephone Encounter (Signed)
Paul Sandoval returning [place] phone call from [place]   Pt call back [phone]

## 2021-01-06 NOTE — Telephone Encounter (Signed)
Called and discussed with pt. Pt verbalized understanding.  

## 2021-01-06 NOTE — Telephone Encounter (Signed)
Called pt. See phone call

## 2021-01-06 NOTE — Telephone Encounter (Signed)
Pt left VM stating his pt wants him to go see nephrology for anemia and iron infusions. Pt wants to make sure Dr.McLean is ok with this plan before he agrees to receiving infusions.PCP office note routed to [facility] for advice.

## 2021-01-06 NOTE — Telephone Encounter (Signed)
PCP referred pt to nephrology and to start iron.  " I want to run everything by Dr Julien Nordmann. Is it ok for me to go to kidney specialist and  take iron 325 mg/day."?

## 2021-01-09 NOTE — Telephone Encounter (Signed)
Per Dr. Julien Nordmann, yes, pt can do this. I spoke with pt and advised as indicated. Pt expressed understanding of the information.

## 2021-01-11 ENCOUNTER — Telehealth: Payer: Self-pay | Admitting: Medical Oncology

## 2021-01-11 NOTE — Telephone Encounter (Signed)
Lab moved to before CT scan.

## 2021-01-12 ENCOUNTER — Telehealth: Payer: Self-pay | Admitting: Internal Medicine

## 2021-01-12 NOTE — Progress Notes (Signed)
Pt aware.

## 2021-01-12 NOTE — Telephone Encounter (Signed)
R/s appt per 5/19 sch msg. Pt aware.

## 2021-01-24 ENCOUNTER — Other Ambulatory Visit: Payer: Self-pay | Admitting: Family Medicine

## 2021-01-25 ENCOUNTER — Telehealth: Payer: Self-pay

## 2021-01-25 DIAGNOSIS — J449 Chronic obstructive pulmonary disease, unspecified: Secondary | ICD-10-CM | POA: Diagnosis not present

## 2021-01-25 NOTE — Telephone Encounter (Signed)
Script was received by [facility] 01/05/21- Patient will contact [facility] for refill

## 2021-01-25 NOTE — Telephone Encounter (Signed)
01/05/21 - last visit hospital follow up

## 2021-01-25 NOTE — Telephone Encounter (Signed)
omeprazole (PRILOSEC) 20 MG capsule [place], [place] - [place]  Pt call back [phone]

## 2021-01-26 ENCOUNTER — Other Ambulatory Visit (HOSPITAL_COMMUNITY): Payer: Medicare Other

## 2021-01-26 ENCOUNTER — Other Ambulatory Visit: Payer: Self-pay

## 2021-01-26 ENCOUNTER — Encounter (HOSPITAL_COMMUNITY): Payer: Self-pay

## 2021-01-26 ENCOUNTER — Ambulatory Visit (HOSPITAL_COMMUNITY)
Admission: RE | Admit: 2021-01-26 | Discharge: 2021-01-26 | Disposition: A | Discharge status: Home / Self Care | Payer: Medicare Other | Source: Ambulatory Visit | Attending: Adult Health | Admitting: Adult Health

## 2021-01-26 VITALS — BP 118/60 | HR 80 | Wt 179.0 lb

## 2021-01-26 DIAGNOSIS — C3490 Malignant neoplasm of unspecified part of unspecified bronchus or lung: Secondary | ICD-10-CM | POA: Diagnosis not present

## 2021-01-26 DIAGNOSIS — Z87891 Personal history of nicotine dependence: Secondary | ICD-10-CM | POA: Insufficient documentation

## 2021-01-26 DIAGNOSIS — Z8249 Family history of ischemic heart disease and other diseases of the circulatory system: Secondary | ICD-10-CM | POA: Diagnosis not present

## 2021-01-26 DIAGNOSIS — Z7984 Long term (current) use of oral hypoglycemic drugs: Secondary | ICD-10-CM | POA: Insufficient documentation

## 2021-01-26 DIAGNOSIS — I6529 Occlusion and stenosis of unspecified carotid artery: Secondary | ICD-10-CM | POA: Diagnosis not present

## 2021-01-26 DIAGNOSIS — N183 Chronic kidney disease, stage 3 unspecified: Secondary | ICD-10-CM | POA: Insufficient documentation

## 2021-01-26 DIAGNOSIS — Z7951 Long term (current) use of inhaled steroids: Secondary | ICD-10-CM | POA: Diagnosis not present

## 2021-01-26 DIAGNOSIS — I130 Hypertensive heart and chronic kidney disease with heart failure and stage 1 through stage 4 chronic kidney disease, or unspecified chronic kidney disease: Secondary | ICD-10-CM | POA: Insufficient documentation

## 2021-01-26 DIAGNOSIS — I4510 Unspecified right bundle-branch block: Secondary | ICD-10-CM | POA: Diagnosis not present

## 2021-01-26 DIAGNOSIS — Z951 Presence of aortocoronary bypass graft: Secondary | ICD-10-CM | POA: Diagnosis not present

## 2021-01-26 DIAGNOSIS — I2510 Atherosclerotic heart disease of native coronary artery without angina pectoris: Secondary | ICD-10-CM | POA: Diagnosis not present

## 2021-01-26 DIAGNOSIS — Z7902 Long term (current) use of antithrombotics/antiplatelets: Secondary | ICD-10-CM | POA: Diagnosis not present

## 2021-01-26 DIAGNOSIS — J90 Pleural effusion, not elsewhere classified: Secondary | ICD-10-CM | POA: Insufficient documentation

## 2021-01-26 DIAGNOSIS — I255 Ischemic cardiomyopathy: Secondary | ICD-10-CM | POA: Diagnosis not present

## 2021-01-26 DIAGNOSIS — N1831 Chronic kidney disease, stage 3a: Secondary | ICD-10-CM

## 2021-01-26 DIAGNOSIS — Z955 Presence of coronary angioplasty implant and graft: Secondary | ICD-10-CM | POA: Insufficient documentation

## 2021-01-26 DIAGNOSIS — Z79899 Other long term (current) drug therapy: Secondary | ICD-10-CM | POA: Insufficient documentation

## 2021-01-26 DIAGNOSIS — Z7982 Long term (current) use of aspirin: Secondary | ICD-10-CM | POA: Insufficient documentation

## 2021-01-26 DIAGNOSIS — I5042 Chronic combined systolic (congestive) and diastolic (congestive) heart failure: Secondary | ICD-10-CM | POA: Diagnosis not present

## 2021-01-26 DIAGNOSIS — E785 Hyperlipidemia, unspecified: Secondary | ICD-10-CM | POA: Diagnosis not present

## 2021-01-26 DIAGNOSIS — J449 Chronic obstructive pulmonary disease, unspecified: Secondary | ICD-10-CM | POA: Diagnosis not present

## 2021-01-26 LAB — BASIC METABOLIC PANEL
Anion gap: 7 (ref 5–15)
BUN: 23 mg/dL (ref 8–23)
CO2: 31 mmol/L (ref 22–32)
Calcium: 9 mg/dL (ref 8.9–10.3)
Chloride: 99 mmol/L (ref 98–111)
Creatinine, Ser: 1.72 mg/dL — ABNORMAL HIGH (ref 0.61–1.24)
GFR, Estimated: 39 mL/min — ABNORMAL LOW (ref >60)
Glucose, Bld: 108 mg/dL — ABNORMAL HIGH (ref 70–99)
Potassium: 4.8 mmol/L (ref 3.5–5.1)
Sodium: 137 mmol/L (ref 135–145)

## 2021-01-26 LAB — BRAIN NATRIURETIC PEPTIDE: B Natriuretic Peptide: 742.3 pg/mL — ABNORMAL HIGH (ref 0.0–100.0)

## 2021-01-26 NOTE — Patient Instructions (Signed)
Labs today -we will call only with abnormal labs

## 2021-01-26 NOTE — Progress Notes (Signed)
PCP: Dr. Wolfgang Phoenix HF Cardiology: Dr. Aundra Dubin  80 y.o. with history of COPD, CAD s/p CABG in 1996 and PCI to dLM and SVG-D in 7/15, ischemic cardiomyopathy, and non-small cell lung cancer presents for followup of CHF and dyspnea.  He was initially referred to [place] by Dr. Lenna Gilford after fall in EF and worsening dyspnea was noted.   Patient reports that his initial symptoms prior to CABG in 1996 were dyspnea.  He has never had any worrisome chest pain.  He did well initially after CABG and was golfing regularly and walking 2-3 miles/day up until around 3/17.  He had one episode of increased dyspnea in 2015 and had cath with PCI to distal LM and SVG-D.  In 3/17, he was diagnosed with NSCLC and was treated with radiation and chemotherapy.  Since then, he has had progressive exertional dyspnea.  He was noted on echo in 7/18 to have EF down to 20-25%.    RHC/LHC was done in 9/18, showing occluded SVG-small D, patent LIMA-LAD and SVG-PDA, 50% shelf-like stenosis distal left main (no intervention), filling pressure not elevated and cardiac output preserved.   Echo in 8/19 showed that EF remains 30-35%.  Echo in 2/21 showed EF 35%, mildly decreased RV systolic function.   Admitted 0/73/71 with A/C Systolic Heart Failure exacerbation. Diuresed with IV lasix and transitioned to  Lasix 40 mg daily.   Today he returns for post hospital follow up. Overall feeling fine. Mild shortness of breath walking around the grocery store. Says it takes Denies PND/Orthopnea. Appetite ok. No fever or chills. Weight at home 174-176 pounds. SBP 100-120. Taking all medications.  Labs (9/17): LDL 90 Labs (7/18): K 3.5, creatinine 1.28, hgb 10.7 Labs (8/18): LDL 68 Labs (9/18): K 4.1, creatinine 1.2, hgb 11.1, plts 98 K Labs (10/18): K 4.1, creatinine 1.5 Labs (1/19): K 4.3, creatinine 1.54, LDL 80, HDL 49, LFTs normal, TSH normal Labs (4/19): K 4.3, creatinine 1.52, hgb 11.8 Labs (7/19): K 4.1, creatinine 1.5, LDL 75, LFTs normal,  hgb 12.2 Labs (10/19): K 4.3, creatinine 1.53 Labs (12/19): K 4.1, creatinine 1.49 Labs (10/20): K 4.4, creatinine 1.67  Labs (1/21): LDL 66 Labs (12/20): K 4.1, creatinine 1.7 Labs (4/21): K 4.1, creatinine 1.71 Labs (5/21): K 4.5, creatinine 1.81 Labs (9/21): K 4.1, creatinine 1.88, LDL 59, HDL 41 Labs (4/22): K 4.4, creatinine 2.32 => 2.21, hgb 9.7  PMH: 1. COPD: PFTs (6/17) with severe obstructive airways disease.  2. AAA: s/p repair with stent graft.  3. Non-small cell lung cancer: Stage IIIA, diagnosed 3/17.  He had radiation as well as chemotherapy with carboplatin and paclitaxel.   4. CKD stage 3 5. PNA in 6/17 6. Recurrent right pleural effusion: VATS with talc pleurodesis on right.  No malignant cells noted.  7. HTN 8. H/o THR 9. CAD: CABG 1996 with SVG-D, SVG-PDA, LIMA-LAD.   - LHC (7/15): Totally occluded RCA and LAD.  Severe distal LM stenosis.  Patent SVG-PDA, patent LIMA-LAD.  Severe disease SVG-D.  Patient had DES to distal left main and staged PCI of SVG-D.   - Cardiolite (7/17): EF 30%, prior infarction with no ischemia.  - LHC (9/18): occluded SVG-small D, patent LIMA-LAD and SVG-PDA, 50% shelf-like stenosis distal left main (no intervention).  10. Chronic systolic CHF: Ischemic cardiomyopathy.   - Echo (6/17): EF 30-35%. - Echo (7/18): EF 20-25%, moderate RV dilation with mildly decreased RV systolic function.  - RHC (9/18): mean RA 3, PA 28/9 mean 18, mean PCWP  10, CI 4.05.  - Echo (8/19): EF 30-35%, mildly decreased RV systolic function.   - Echo (2/21): EF 35%, mildly decreased RV systolic function -Echo 10/2949 EF 30-35%  11. Carotid stenosis: Carotid dopplers (7/20) with 40-59% BICA stenosis.  - Carotid dopplers (7/21): 40-59% BICA stenosis.   Social History   Socioeconomic History  . Marital status: Married    Spouse name: Not on file  . Number of children: 1  . Years of education: Not on file  . Highest education level: Not on file  Occupational  History  . Occupation: Retired    Comment: [employer]  . Smoking status: Former Smoker    Packs/day: 1.50    Years: 30.00    Pack years: 45.00    Types: Cigarettes    Start date: 12/01/1956    Quit date: 01/08/1995    Years since quitting: 26.0  . Smokeless tobacco: Never Used  Vaping Use  . Vaping Use: Never used  Substance and Sexual Activity  . Alcohol use: No    Alcohol/week: 0.0 standard drinks    Comment: 03/18/2014 "no alacohol since 1996"  . Drug use: No  . Sexual activity: Never  Other Topics Concern  . Not on file  Social History Narrative  . Not on file   Social Determinants of Health   Financial Resource Strain: Not on file  Food Insecurity: Not on file  Transportation Needs: Not on file  Physical Activity: Not on file  Stress: Not on file  Social Connections: Not on file  Intimate Partner Violence: Not on file   Family History  Problem Relation Age of Onset  . Heart disease Father   . Cancer Father        Lung  . Arthritis Mother   . Parkinsonism Mother   . Arthritis Sister        Brother with rheumatoid arthritis  . Hypertension Brother   . Colon cancer Neg Hx   . Colon polyps Neg Hx    ROS: All systems reviewed and negative except as per HPI.   Current Outpatient Medications  Medication Sig Dispense Refill  . albuterol (VENTOLIN HFA) 108 (90 Base) MCG/ACT inhaler INHALE 2 PUFFS BY MOUTH EVERY 4 TO 6 HOURS AS NEEDED FOR WHEEZING. 8.5 g 5  . aspirin EC 81 MG tablet Take 81 mg by mouth daily before breakfast.     . carvedilol (COREG) 12.5 MG tablet Take 1.5 tablets (18.75 mg total) by mouth 2 (two) times daily with a meal. 180 tablet 3  . clopidogrel (PLAVIX) 75 MG tablet TAKE (1) TABLET BY MOUTH ONCE DAILY. 90 tablet 0  . dapagliflozin propanediol (FARXIGA) 10 MG TABS tablet Take 10 mg by mouth daily before breakfast. 30 tablet 11  . eplerenone (INSPRA) 50 MG tablet Take 1 tablet (50 mg total) by mouth every evening. 30 tablet  11  . Fluticasone-Salmeterol (ADVAIR) 100-50 MCG/DOSE AEPB Inhale 1 puff into the lungs 2 (two) times daily.    . furosemide (LASIX) 40 MG tablet Take 1 tablet (40 mg total) by mouth daily. 30 tablet 1  . ipratropium-albuterol (DUONEB) 0.5-2.5 (3) MG/3ML SOLN INHALE 1 VIAL VIA NEBULIZER 4 TIMES DAILY. 360 mL 3  . Multiple Vitamins-Minerals (CENTRUM SILVER ADULT 50+) TABS Take 1 tablet by mouth daily with lunch.     . nitroGLYCERIN (NITROSTAT) 0.4 MG SL tablet PLACE 1 TAB UNDER TONGUE EVERY 5 MIN IF NEEDED FOR CHEST PAIN. MAY USE 3 TIMES.NO RELIEF CALL  911. (Patient not taking: Reported on 01/05/2021) 25 tablet 1  . omeprazole (PRILOSEC) 20 MG capsule Take 1 capsule (20 mg total) by mouth daily. 90 capsule 1  . rosuvastatin (CRESTOR) 20 MG tablet TAKE ONE TABLET BY MOUTH ONCE DAILY. 90 tablet 3  . sacubitril-valsartan (ENTRESTO) 97-103 MG Take 0.5 tablets by mouth 2 (two) times daily.     No current facility-administered medications for this encounter.   BP 118/60   Pulse 80   Wt 81.2 kg (179 lb)   SpO2 96%   BMI 25.68 kg/m   Wt Readings from Last 3 Encounters:  01/26/21 81.2 kg  01/05/21 83 kg  12/25/20 81.2 kg    General:  Well appearing. No resp difficulty HEENT: normal Neck: supple. no JVD. Carotids 2+ bilat; no bruits. No lymphadenopathy or thryomegaly appreciated. Cor: PMI nondisplaced. Regular rate & rhythm. No rubs, gallops or murmurs. Lungs: clear Abdomen: soft, nontender, nondistended. No hepatosplenomegaly. No bruits or masses. Good bowel sounds. Extremities: no cyanosis, clubbing, rash, edema Neuro: alert & orientedx3, cranial nerves grossly intact. moves all 4 extremities w/o difficulty. Affect pleasant  Assessment/Plan: 1. Chronic systolic CHF: Ischemic cardiomyopathy.  Echo in 7/18 with EF 20-25%, echo 8/19 with EF 30-35%.  Echo in 2/21 showed EF 35%.   Echo 12/2020 EF 30-35% . EF persistently low, qualifies for ICD.  He has a RBBB so not good CRT candidate.  He has not  been interested in getting an ICD, think this is reasonable given age and lung cancer history. - NYHA III. Volume status stable. Continue lasix 40 mg daily.  - Continue Entresto 49/51 bid.  - Continue Coreg to 18.75  mg bid  - Continue eplerenone 50 mg daily.   - Continue dapagliflozin 10 mg daily. - check BMET   2. CAD: s/p CABG then PCI 7/15 to distal LM and SVG-D.  Anginal equivalent in the past appears to have been dyspnea, he has never had significant chest pain.  LHC (9/18) showed patent LIMA-LAD and SVG-PDA; occluded SVG-D; 50% shelf-like LM stenosis.  - Continue ASA 81, Plavix, and Crestor.   - Consider Cardiolite if symptoms continue to be worse than baseline after treatment of AECOPD.  High threshold for cath with elevated creatinine.  - no chest pain.  3. COPD: Severe COPD by PFTs in 6/17.  Based on the last RHC/LHC, I have suspected that COPD plays a major role in his mild ongoing dyspnea.  Currently with wheezing on exam, I suspect that he has a COPD exacerbation.  - No wheeze on exam.  4. Hyperlipidemia: Good LDL in 9/21.  5. NSCLC: Follows with Dr. Earlie Server.  He has finished chemo/radiation. Cancer appeared quiescent on most recent scans with Dr. Earlie Server.   6. Right pleural effusion: This has been chronic. S/p VATS.  7. Carotid stenosis: Due for repeat dopplers in 7/22.  8. CKD stage 3: BMET today.    Reviewed recent d/c summary.  Check BMEt/BNP  Follow up in 3 months with Dr Precious Bard 01/26/2021

## 2021-01-27 ENCOUNTER — Telehealth: Payer: Self-pay

## 2021-01-27 NOTE — Telephone Encounter (Signed)
Please advise. Thank you

## 2021-01-27 NOTE — Telephone Encounter (Signed)
Patient doesn't want to go to the [facility] in [place].  He wants to go to the one in [place] on [address].  The only one I could find was [place], [address] [place], [place] [address] which is right off of [address]. Patient will need a new referral put in for this location. Marland Kitchen

## 2021-01-29 NOTE — Telephone Encounter (Signed)
Yes pls give him the referral to the [place] location.  Thx. Dr. Lovena Le

## 2021-01-30 ENCOUNTER — Other Ambulatory Visit: Payer: Self-pay | Admitting: *Deleted

## 2021-01-30 ENCOUNTER — Other Ambulatory Visit (HOSPITAL_COMMUNITY): Payer: Self-pay | Admitting: Cardiology

## 2021-01-30 ENCOUNTER — Telehealth: Payer: Self-pay | Admitting: Pulmonary Disease

## 2021-01-30 DIAGNOSIS — C3411 Malignant neoplasm of upper lobe, right bronchus or lung: Secondary | ICD-10-CM

## 2021-01-30 DIAGNOSIS — N179 Acute kidney failure, unspecified: Secondary | ICD-10-CM

## 2021-01-30 MED ORDER — NYSTATIN 100000 UNIT/ML MT SUSP: 5.0000 mL | Freq: Two times a day (BID) | OROMUCOSAL | 0 refills | Status: AC

## 2021-01-30 NOTE — Telephone Encounter (Signed)
Referral put in.

## 2021-01-30 NOTE — Telephone Encounter (Signed)
Spoke with pt who believes the thrush might be back. Pt is on [place] which he says that he does rinse his mouth out religiously after each use and also even brushes his teeth afterwards too.  Pt was prescribed fluconazole by oncologist and took it 4/19-4/29 which did help but now symptoms are back.  Pt said that he has red areas on his tongue which is why he believes that this could be thrush again.  Pt wants to know what we can recommend to help with this. Dr. Elsworth Soho, please advise.   **Pt said when we hear from Dr. Elsworth Soho about recs, if we are not able to reach him on the phone that it is okay to leave him a detailed message with recommendations. Pharmacy is [facility].

## 2021-01-30 NOTE — Telephone Encounter (Signed)
Nystatin 5 mL twice daily swish and swallow for 10 days

## 2021-01-30 NOTE — Telephone Encounter (Signed)
Called and spoke with pt letting him know the recs per Dr. Elsworth Soho to help with the thrush in his mouth and pt verbalized understanding. Rx for nystatin has been sent to preferred pharmacy for pt. Nothing further needed.

## 2021-01-30 NOTE — Telephone Encounter (Signed)
Raquel Sarna spoke to the patient looks like he has thrush transferring encounter to [facility]

## 2021-01-30 NOTE — Telephone Encounter (Signed)
ATC x1, patient was not at home, left message with the woman that answered the phone to return our call.

## 2021-02-01 ENCOUNTER — Telehealth (HOSPITAL_COMMUNITY): Payer: Self-pay | Admitting: *Deleted

## 2021-02-01 NOTE — Telephone Encounter (Addendum)
labs mailed to pts home address as requested.

## 2021-02-06 ENCOUNTER — Telehealth: Payer: Self-pay | Admitting: Pulmonary Disease

## 2021-02-06 NOTE — Telephone Encounter (Signed)
Called and spoke with patient regarding Nystatin suspension. Patient states that he was given 100 mL by the pharmacy. States he used the plastic medicine cup to measure 5 mL each time, but states he ran out in 7 days. He states that his tongue is looking better and on the way to recovery. He questions if 7 days is enough time or does he needed to get an additional 3 days worth of medication called in? He uses [facility] in [place].   Dr. Elsworth Soho please advise  Thank you

## 2021-02-07 MED ORDER — NYSTATIN 100000 UNIT/ML MT SUSP: 5.0000 mL | Freq: Four times a day (QID) | OROMUCOSAL | 3 refills | Status: DC

## 2021-02-07 NOTE — Telephone Encounter (Signed)
Per Liborio Nixon Dr Elsworth Soho is on vacation. Will send message to DOD for recommendations.   MR please advise if we can send in an additional 7 day supply of nystatin suspension. Thanks :)

## 2021-02-07 NOTE — Telephone Encounter (Signed)
Patient checking on Nystatin suspension. Patient phone number is [phone].

## 2021-02-07 NOTE — Telephone Encounter (Signed)
#   Oral thrush - For Oral thrush: Take Suspension (swish and swallow): 500,000 units 4 times/day for 7 days with 3 refills

## 2021-02-07 NOTE — Telephone Encounter (Signed)
Call made to patient, confirmed DOB. Made aware refill of nystatin has been placed. Voiced understanding.   Nothing further is needed at this time.

## 2021-02-08 ENCOUNTER — Other Ambulatory Visit: Payer: Self-pay | Admitting: *Deleted

## 2021-02-08 DIAGNOSIS — I6523 Occlusion and stenosis of bilateral carotid arteries: Secondary | ICD-10-CM

## 2021-02-08 DIAGNOSIS — Z95828 Presence of other vascular implants and grafts: Secondary | ICD-10-CM

## 2021-02-13 ENCOUNTER — Other Ambulatory Visit (HOSPITAL_COMMUNITY): Payer: Self-pay | Admitting: Cardiology

## 2021-02-13 DIAGNOSIS — H0288B Meibomian gland dysfunction left eye, upper and lower eyelids: Secondary | ICD-10-CM | POA: Diagnosis not present

## 2021-02-13 DIAGNOSIS — C3411 Malignant neoplasm of upper lobe, right bronchus or lung: Secondary | ICD-10-CM

## 2021-02-13 DIAGNOSIS — H0288A Meibomian gland dysfunction right eye, upper and lower eyelids: Secondary | ICD-10-CM | POA: Diagnosis not present

## 2021-02-13 DIAGNOSIS — H25813 Combined forms of age-related cataract, bilateral: Secondary | ICD-10-CM | POA: Diagnosis not present

## 2021-02-14 ENCOUNTER — Telehealth (HOSPITAL_COMMUNITY): Payer: Self-pay | Admitting: Cardiology

## 2021-02-14 ENCOUNTER — Other Ambulatory Visit (HOSPITAL_COMMUNITY): Payer: Self-pay | Admitting: *Deleted

## 2021-02-14 MED ORDER — CARVEDILOL 12.5 MG PO TABS
18.7500 mg | ORAL_TABLET | Freq: Two times a day (BID) | ORAL | 3 refills | Status: DC
Start: 2021-02-14 — End: 2021-10-09

## 2021-02-14 NOTE — Telephone Encounter (Signed)
Pt request carvedilol 12.5 mg tablet, pt stated he is out of meds, please advise

## 2021-03-13 ENCOUNTER — Other Ambulatory Visit: Payer: Medicare Other

## 2021-03-13 ENCOUNTER — Inpatient Hospital Stay: Payer: Medicare Other | Attending: Internal Medicine

## 2021-03-13 ENCOUNTER — Ambulatory Visit (HOSPITAL_COMMUNITY)
Admission: RE | Admit: 2021-03-13 | Discharge: 2021-03-13 | Disposition: A | Discharge status: Home / Self Care | Payer: Medicare Other | Source: Ambulatory Visit | Attending: Internal Medicine | Admitting: Internal Medicine

## 2021-03-13 ENCOUNTER — Other Ambulatory Visit: Payer: Self-pay

## 2021-03-13 DIAGNOSIS — R911 Solitary pulmonary nodule: Secondary | ICD-10-CM | POA: Diagnosis not present

## 2021-03-13 DIAGNOSIS — R918 Other nonspecific abnormal finding of lung field: Secondary | ICD-10-CM | POA: Diagnosis not present

## 2021-03-13 DIAGNOSIS — I509 Heart failure, unspecified: Secondary | ICD-10-CM | POA: Insufficient documentation

## 2021-03-13 DIAGNOSIS — J449 Chronic obstructive pulmonary disease, unspecified: Secondary | ICD-10-CM | POA: Diagnosis not present

## 2021-03-13 DIAGNOSIS — J90 Pleural effusion, not elsewhere classified: Secondary | ICD-10-CM | POA: Insufficient documentation

## 2021-03-13 DIAGNOSIS — J479 Bronchiectasis, uncomplicated: Secondary | ICD-10-CM | POA: Diagnosis not present

## 2021-03-13 DIAGNOSIS — R0609 Other forms of dyspnea: Secondary | ICD-10-CM | POA: Diagnosis not present

## 2021-03-13 DIAGNOSIS — C771 Secondary and unspecified malignant neoplasm of intrathoracic lymph nodes: Secondary | ICD-10-CM | POA: Diagnosis not present

## 2021-03-13 DIAGNOSIS — I700 Atherosclerosis of aorta: Secondary | ICD-10-CM | POA: Diagnosis not present

## 2021-03-13 DIAGNOSIS — Z79899 Other long term (current) drug therapy: Secondary | ICD-10-CM | POA: Insufficient documentation

## 2021-03-13 DIAGNOSIS — Z8719 Personal history of other diseases of the digestive system: Secondary | ICD-10-CM | POA: Insufficient documentation

## 2021-03-13 DIAGNOSIS — I130 Hypertensive heart and chronic kidney disease with heart failure and stage 1 through stage 4 chronic kidney disease, or unspecified chronic kidney disease: Secondary | ICD-10-CM | POA: Insufficient documentation

## 2021-03-13 DIAGNOSIS — R5383 Other fatigue: Secondary | ICD-10-CM | POA: Insufficient documentation

## 2021-03-13 DIAGNOSIS — N183 Chronic kidney disease, stage 3 unspecified: Secondary | ICD-10-CM | POA: Diagnosis not present

## 2021-03-13 DIAGNOSIS — C3490 Malignant neoplasm of unspecified part of unspecified bronchus or lung: Secondary | ICD-10-CM | POA: Insufficient documentation

## 2021-03-13 DIAGNOSIS — Z9049 Acquired absence of other specified parts of digestive tract: Secondary | ICD-10-CM | POA: Diagnosis not present

## 2021-03-13 DIAGNOSIS — I252 Old myocardial infarction: Secondary | ICD-10-CM | POA: Diagnosis not present

## 2021-03-13 DIAGNOSIS — D649 Anemia, unspecified: Secondary | ICD-10-CM | POA: Insufficient documentation

## 2021-03-13 DIAGNOSIS — I255 Ischemic cardiomyopathy: Secondary | ICD-10-CM | POA: Diagnosis not present

## 2021-03-13 DIAGNOSIS — J439 Emphysema, unspecified: Secondary | ICD-10-CM | POA: Diagnosis not present

## 2021-03-13 DIAGNOSIS — C3411 Malignant neoplasm of upper lobe, right bronchus or lung: Secondary | ICD-10-CM | POA: Diagnosis not present

## 2021-03-13 DIAGNOSIS — R059 Cough, unspecified: Secondary | ICD-10-CM | POA: Insufficient documentation

## 2021-03-13 DIAGNOSIS — I2510 Atherosclerotic heart disease of native coronary artery without angina pectoris: Secondary | ICD-10-CM | POA: Diagnosis not present

## 2021-03-13 LAB — CMP (CANCER CENTER ONLY)
ALT: 9 U/L (ref 0–44)
AST: 15 U/L (ref 15–41)
Albumin: 3.4 g/dL — ABNORMAL LOW (ref 3.5–5.0)
Alkaline Phosphatase: 75 U/L (ref 38–126)
Anion gap: 8 (ref 5–15)
BUN: 26 mg/dL — ABNORMAL HIGH (ref 8–23)
CO2: 30 mmol/L (ref 22–32)
Calcium: 9.3 mg/dL (ref 8.9–10.3)
Chloride: 102 mmol/L (ref 98–111)
Creatinine: 2.01 mg/dL — ABNORMAL HIGH (ref 0.61–1.24)
GFR, Estimated: 33 mL/min — ABNORMAL LOW (ref >60)
Glucose, Bld: 93 mg/dL (ref 70–99)
Potassium: 5.1 mmol/L (ref 3.5–5.1)
Sodium: 140 mmol/L (ref 135–145)
Total Bilirubin: 0.4 mg/dL (ref 0.3–1.2)
Total Protein: 7.5 g/dL (ref 6.5–8.1)

## 2021-03-13 LAB — CBC WITH DIFFERENTIAL (CANCER CENTER ONLY)
Abs Immature Granulocytes: 0.03 10*3/uL (ref 0.00–0.07)
Basophils Absolute: 0 10*3/uL (ref 0.0–0.1)
Basophils Relative: 0 %
Eosinophils Absolute: 0.1 10*3/uL (ref 0.0–0.5)
Eosinophils Relative: 1 %
HCT: 36.3 % — ABNORMAL LOW (ref 39.0–52.0)
Hemoglobin: 11.3 g/dL — ABNORMAL LOW (ref 13.0–17.0)
Immature Granulocytes: 0 %
Lymphocytes Relative: 12 %
Lymphs Abs: 1 10*3/uL (ref 0.7–4.0)
MCH: 27.4 pg (ref 26.0–34.0)
MCHC: 31.1 g/dL (ref 30.0–36.0)
MCV: 87.9 fL (ref 80.0–100.0)
Monocytes Absolute: 0.7 10*3/uL (ref 0.1–1.0)
Monocytes Relative: 8 %
Neutro Abs: 6.3 10*3/uL (ref 1.7–7.7)
Neutrophils Relative %: 79 %
Platelet Count: 126 10*3/uL — ABNORMAL LOW (ref 150–400)
RBC: 4.13 MIL/uL — ABNORMAL LOW (ref 4.22–5.81)
RDW: 16.5 % — ABNORMAL HIGH (ref 11.5–15.5)
WBC Count: 8.1 10*3/uL (ref 4.0–10.5)
nRBC: 0 % (ref 0.0–0.2)

## 2021-03-14 ENCOUNTER — Telehealth (HOSPITAL_COMMUNITY): Payer: Self-pay | Admitting: *Deleted

## 2021-03-14 ENCOUNTER — Other Ambulatory Visit (HOSPITAL_COMMUNITY): Payer: Self-pay | Admitting: *Deleted

## 2021-03-14 DIAGNOSIS — I5042 Chronic combined systolic (congestive) and diastolic (congestive) heart failure: Secondary | ICD-10-CM

## 2021-03-14 MED ORDER — FUROSEMIDE 40 MG PO TABS: 20.0000 mg | ORAL_TABLET | Freq: Every day | ORAL | 1 refills | Status: DC

## 2021-03-14 NOTE — Telephone Encounter (Signed)
-----   Message from Larey Dresser, MD sent at 03/14/2021  4:03 PM EDT ----- Would have him decrease Lasix from 40 mg daily to 20 mg daily.  BMET 10 days.  ----- Message ----- From: Harvie Junior, CMA Sent: 03/14/2021   1:31 PM EDT To: Larey Dresser, MD  Pt asked that Dr.McLean please look at lab results specifically creatinine.

## 2021-03-14 NOTE — Telephone Encounter (Signed)
Pt aware and agreeable with plan. Lab appt scheduled.

## 2021-03-14 NOTE — Telephone Encounter (Signed)
Pt left vm requesting Dr.McLean look at his recent lab results specifically creatinine.   Labs routed to [place]

## 2021-03-15 ENCOUNTER — Other Ambulatory Visit: Payer: Self-pay

## 2021-03-15 ENCOUNTER — Inpatient Hospital Stay (HOSPITAL_BASED_OUTPATIENT_CLINIC_OR_DEPARTMENT_OTHER): Payer: Medicare Other | Admitting: Internal Medicine

## 2021-03-15 VITALS — BP 107/47 | HR 84 | Temp 97.6°F | Resp 20 | Ht 70.0 in | Wt 181.9 lb

## 2021-03-15 DIAGNOSIS — J90 Pleural effusion, not elsewhere classified: Secondary | ICD-10-CM | POA: Diagnosis not present

## 2021-03-15 DIAGNOSIS — I130 Hypertensive heart and chronic kidney disease with heart failure and stage 1 through stage 4 chronic kidney disease, or unspecified chronic kidney disease: Secondary | ICD-10-CM | POA: Diagnosis not present

## 2021-03-15 DIAGNOSIS — I700 Atherosclerosis of aorta: Secondary | ICD-10-CM | POA: Diagnosis not present

## 2021-03-15 DIAGNOSIS — Z79899 Other long term (current) drug therapy: Secondary | ICD-10-CM | POA: Diagnosis not present

## 2021-03-15 DIAGNOSIS — I509 Heart failure, unspecified: Secondary | ICD-10-CM | POA: Diagnosis not present

## 2021-03-15 DIAGNOSIS — R5383 Other fatigue: Secondary | ICD-10-CM | POA: Diagnosis not present

## 2021-03-15 DIAGNOSIS — R059 Cough, unspecified: Secondary | ICD-10-CM | POA: Diagnosis not present

## 2021-03-15 DIAGNOSIS — Z9049 Acquired absence of other specified parts of digestive tract: Secondary | ICD-10-CM | POA: Diagnosis not present

## 2021-03-15 DIAGNOSIS — D649 Anemia, unspecified: Secondary | ICD-10-CM | POA: Diagnosis not present

## 2021-03-15 DIAGNOSIS — I255 Ischemic cardiomyopathy: Secondary | ICD-10-CM | POA: Diagnosis not present

## 2021-03-15 DIAGNOSIS — Z8719 Personal history of other diseases of the digestive system: Secondary | ICD-10-CM | POA: Diagnosis not present

## 2021-03-15 DIAGNOSIS — C771 Secondary and unspecified malignant neoplasm of intrathoracic lymph nodes: Secondary | ICD-10-CM | POA: Diagnosis not present

## 2021-03-15 DIAGNOSIS — I252 Old myocardial infarction: Secondary | ICD-10-CM | POA: Diagnosis not present

## 2021-03-15 DIAGNOSIS — R0609 Other forms of dyspnea: Secondary | ICD-10-CM | POA: Diagnosis not present

## 2021-03-15 DIAGNOSIS — C3490 Malignant neoplasm of unspecified part of unspecified bronchus or lung: Secondary | ICD-10-CM | POA: Diagnosis not present

## 2021-03-15 DIAGNOSIS — C3411 Malignant neoplasm of upper lobe, right bronchus or lung: Secondary | ICD-10-CM | POA: Diagnosis not present

## 2021-03-15 DIAGNOSIS — N183 Chronic kidney disease, stage 3 unspecified: Secondary | ICD-10-CM | POA: Diagnosis not present

## 2021-03-15 DIAGNOSIS — J449 Chronic obstructive pulmonary disease, unspecified: Secondary | ICD-10-CM | POA: Diagnosis not present

## 2021-03-15 DIAGNOSIS — I2510 Atherosclerotic heart disease of native coronary artery without angina pectoris: Secondary | ICD-10-CM | POA: Diagnosis not present

## 2021-03-15 MED ORDER — AMOXICILLIN-POT CLAVULANATE 875-125 MG PO TABS: 1.0000 | ORAL_TABLET | Freq: Two times a day (BID) | ORAL | 0 refills | Status: DC

## 2021-03-15 NOTE — Progress Notes (Signed)
[place] [phone]   [phone]  OFFICE PROGRESS NOTE  Erven Colla, DO [place] [address]  DIAGNOSIS: Stage IIIA (T2b, N2, M0) non-small cell lung cancer, favoring squamous cell carcinoma presented with right upper lobe lung mass in addition to mediastinal lymphadenopathy diagnosed in March 2017.  PRIOR THERAPY:  A course of concurrent chemoradiation with weekly carboplatin for AUC of 2 and paclitaxel 45 MG/M2. Status post 5 cycle with partial response.  CURRENT THERAPY: Observation  INTERVAL HISTORY: Paul Sandoval. 80 y.o. male returns to the clinic today for follow-up visit.  The patient is feeling fine today with no concerning complaints except for persistent cough productive of yellowish-greenish sputum.  He was seen by Dr. Elsworth Soho few months ago and treated with Levaquin.  He continues to have the cough but no significant shortness of breath except with exertion and no chest pain or hemoptysis.  The patient denied having any fever or chills.  He has no nausea, vomiting, diarrhea or constipation.  He has no headache or visual changes.  He had repeat CT scan of the chest performed recently and is here for evaluation and discussion of his scan results.  MEDICAL HISTORY: Past Medical History:  Diagnosis Date   AAA (abdominal aortic aneurysm) ([place]) 2010   4.4 cm 08/2008;4.44 in 7/10 and 4.65 in 08/2009; 4.8 by CT in 11/2009; 4.3 by ultrasound in 08/2010   Anemia    Arteriosclerotic cardiovascular disease (ASCVD) 1996   CABG-1996   Arthritis    "fingers" (03/18/2014)   CAD (coronary artery disease)    03/18/14:  PCI with DES to distal left main. 7/29: DES to the SVG to Diag   Cancer [facility])    Upper right lobe lung cancer   Cardiomyopathy, ischemic    Echo 03/17/14: EF 45-50%   CHF (congestive heart failure) (HCC)    Chronic bronchitis (HCC)    Chronic kidney disease    CRF   Chronic rhinitis    Colonic polyp 2002    polypectomy in 2002   COPD (chronic obstructive pulmonary disease) ([place])    Diverticulosis    Dyspnea    with exertion   ED (erectile dysfunction)    Encounter for antineoplastic chemotherapy 12/19/2015   GERD (gastroesophageal reflux disease)    History of blood transfusion    Hyperlipidemia    Hypertension    IFG (impaired fasting glucose)    Myocardial infarction [facility])    "told h/o silent MI sometime before 1996"   Pneumonia ~ 2001; ~ 2005    has had more than twice   Right bundle branch block    Tobacco abuse, in remission    40 pack year total consumption; discontinued in 1996    ALLERGIES:  is allergic to neomycin and cetirizine & related.  MEDICATIONS:  Current Outpatient Medications  Medication Sig Dispense Refill   albuterol (VENTOLIN HFA) 108 (90 Base) MCG/ACT inhaler INHALE 2 PUFFS BY MOUTH EVERY 4 TO 6 HOURS AS NEEDED FOR WHEEZING. 8.5 g 5   aspirin EC 81 MG tablet Take 81 mg by mouth daily before breakfast.      carvedilol (COREG) 12.5 MG tablet Take 1.5 tablets (18.75 mg total) by mouth 2 (two) times daily with a meal. 180 tablet 3   clopidogrel (PLAVIX) 75 MG tablet TAKE (1) TABLET BY MOUTH ONCE DAILY. 90 tablet 0   dapagliflozin propanediol (FARXIGA) 10 MG TABS tablet Take 10 mg  by mouth daily before breakfast. 30 tablet 11   eplerenone (INSPRA) 50 MG tablet Take 1 tablet (50 mg total) by mouth every evening. 30 tablet 11   Fluticasone-Salmeterol (ADVAIR) 100-50 MCG/DOSE AEPB Inhale 1 puff into the lungs 2 (two) times daily.     furosemide (LASIX) 40 MG tablet Take 0.5 tablets (20 mg total) by mouth daily. 30 tablet 1   ipratropium-albuterol (DUONEB) 0.5-2.5 (3) MG/3ML SOLN INHALE 1 VIAL VIA NEBULIZER 4 TIMES DAILY. 360 mL 3   Multiple Vitamins-Minerals (CENTRUM SILVER ADULT 50+) TABS Take 1 tablet by mouth daily with lunch.      nitroGLYCERIN (NITROSTAT) 0.4 MG SL tablet PLACE 1 TAB UNDER TONGUE EVERY 5 MIN IF NEEDED FOR CHEST PAIN. MAY USE 3 TIMES.NO RELIEF CALL  911. 25 tablet 1   nystatin (MYCOSTATIN) 100000 UNIT/ML suspension Take 5 mLs (500,000 Units total) by mouth 4 (four) times daily. 60 mL 3   omeprazole (PRILOSEC) 20 MG capsule Take 1 capsule (20 mg total) by mouth daily. 90 capsule 1   rosuvastatin (CRESTOR) 20 MG tablet TAKE ONE TABLET BY MOUTH ONCE DAILY. 90 tablet 3   sacubitril-valsartan (ENTRESTO) 97-103 MG Take 0.5 tablets by mouth 2 (two) times daily.     No current facility-administered medications for this visit.    SURGICAL HISTORY:  Past Surgical History:  Procedure Laterality Date   ABDOMINAL AORTIC ANEURYSM REPAIR  11/2012   ABDOMINAL AORTIC ENDOVASCULAR STENT GRAFT N/A 12/11/2012   Procedure: ABDOMINAL AORTIC ENDOVASCULAR STENT GRAFT;  Surgeon: Mal Misty, MD;  Location: [place];  Service: Vascular;  Laterality: N/A;  Ultrasound guided; [place]  01/08/1995   COLONOSCOPY  2002   polypectomy-patient denies   CORONARY ANGIOPLASTY WITH STENT PLACEMENT  03/18/2014   "1"   CORONARY ANGIOPLASTY WITH STENT PLACEMENT  03/24/2014   "1"   CORONARY ARTERY BYPASS GRAFT  01/09/1995   "CABG X3"   FLEXIBLE BRONCHOSCOPY N/A 03/01/2017   Procedure: FLEXIBLE BRONCHOSCOPY WITH BIOPSIES;  Surgeon: Gaye Pollack, MD;  Location: [place] [facility];  Service: Thoracic;  Laterality: N/A;   JOINT REPLACEMENT     LAPAROSCOPIC CHOLECYSTECTOMY  12/2009   LEFT AND RIGHT HEART CATHETERIZATION WITH CORONARY/GRAFT ANGIOGRAM N/A 03/18/2014   Procedure: LEFT AND RIGHT HEART CATHETERIZATION WITH Beatrix Fetters;  Surgeon: Blane Ohara, MD;  Location: [facility] CATH LAB;  Service: Cardiovascular;  Laterality: N/A;   PERCUTANEOUS CORONARY STENT INTERVENTION (PCI-S)  03/18/2014   Procedure: PERCUTANEOUS CORONARY STENT INTERVENTION (PCI-S);  Surgeon: Blane Ohara, MD;  Location: [facility] CATH LAB;  Service: Cardiovascular;;   PERCUTANEOUS CORONARY STENT INTERVENTION (PCI-S) N/A 03/24/2014   Procedure: PERCUTANEOUS CORONARY STENT INTERVENTION (PCI-S);   Surgeon: Blane Ohara, MD;  Location: [facility] CATH LAB;  Service: Cardiovascular;  Laterality: N/A;   PLEURAL EFFUSION DRAINAGE Right 03/01/2017   Procedure: DRAINAGE OF PLEURAL EFFUSION;  Surgeon: Gaye Pollack, MD;  Location: [place] OR;  Service: Thoracic;  Laterality: Right;   RIGHT/LEFT HEART CATH AND CORONARY/GRAFT ANGIOGRAPHY N/A 05/15/2017   Procedure: RIGHT/LEFT HEART CATH AND CORONARY/GRAFT ANGIOGRAPHY;  Surgeon: Larey Dresser, MD;  Location: [place] CV LAB;  Service: Cardiovascular;  Laterality: N/A;   TALC PLEURODESIS Right 03/01/2017   Procedure: Pietro Cassis;  Surgeon: Gaye Pollack, MD;  Location: [place];  Service: Thoracic;  Laterality: Right;   TOTAL HIP ARTHROPLASTY Left 01/21/2013   Procedure: TOTAL HIP ARTHROPLASTY ANTERIOR APPROACH;  Surgeon: Mauri Pole, MD;  Location: [place];  Service: Orthopedics;  Laterality: Left;   VIDEO ASSISTED THORACOSCOPY Right 03/01/2017   Procedure: VIDEO ASSISTED THORACOSCOPY WITH BIOPSIES;  Surgeon: Gaye Pollack, MD;  Location: [place];  Service: Thoracic;  Laterality: Right;   VIDEO BRONCHOSCOPY N/A 11/17/2015   Procedure: VIDEO BRONCHOSCOPY WITH FLUORO;  Surgeon: Rigoberto Noel, MD;  Location: [place];  Service: Cardiopulmonary;  Laterality: N/A;    REVIEW OF SYSTEMS:  A comprehensive review of systems was negative except for: Constitutional: positive for fatigue Respiratory: positive for cough, dyspnea on exertion, and sputum   PHYSICAL EXAMINATION: General appearance: alert, cooperative, fatigued and no distress Head: Normocephalic, without obvious abnormality, atraumatic Neck: no adenopathy, no JVD, supple, symmetrical, trachea midline and thyroid not enlarged, symmetric, no tenderness/mass/nodules Lymph nodes: Cervical, supraclavicular, and axillary nodes normal. Resp: clear to auscultation bilaterally Back: symmetric, no curvature. ROM normal. No CVA tenderness. Cardio: regular rate and rhythm, S1, S2 normal, no murmur, click,  rub or gallop GI: soft, non-tender; bowel sounds normal; no masses,  no organomegaly Extremities: extremities normal, atraumatic, no cyanosis or edema Neurologic: Alert and oriented X 3, normal strength and tone. Normal symmetric reflexes. Normal coordination and gait  ECOG PERFORMANCE STATUS: 1 - Symptomatic but completely ambulatory  Blood pressure (!) 107/47, pulse 84, temperature 97.6 F (36.4 C), temperature source Tympanic, resp. rate 20, height _0  (1.778 m), weight 181 lb 14.4 oz (82.5 kg), SpO2 93 %.  LABORATORY DATA: Lab Results  Component Value Date   WBC 8.1 03/13/2021   HGB 11.3 (L) 03/13/2021   HCT 36.3 (L) 03/13/2021   MCV 87.9 03/13/2021   PLT 126 (L) 03/13/2021      Chemistry      Component Value Date/Time   NA 140 03/13/2021 0845   NA 140 05/23/2020 0821   NA 137 05/21/2017 0906   K 5.1 03/13/2021 0845   K 4.1 05/21/2017 0906   CL 102 03/13/2021 0845   CO2 30 03/13/2021 0845   CO2 29 05/21/2017 0906   BUN 26 (H) 03/13/2021 0845   BUN 28 (H) 05/23/2020 0821   BUN 14.6 05/21/2017 0906   CREATININE 2.01 (H) 03/13/2021 0845   CREATININE 1.2 05/21/2017 0906      Component Value Date/Time   CALCIUM 9.3 03/13/2021 0845   CALCIUM 9.4 05/21/2017 0906   ALKPHOS 75 03/13/2021 0845   ALKPHOS 86 05/21/2017 0906   AST 15 03/13/2021 0845   AST 19 05/21/2017 0906   ALT 9 03/13/2021 0845   ALT 14 05/21/2017 0906   BILITOT 0.4 03/13/2021 0845   BILITOT 0.57 05/21/2017 0906       RADIOGRAPHIC STUDIES: CT Chest Wo Contrast  Result Date: 03/13/2021 CLINICAL DATA:  80 year old male with history of non-small cell lung cancer. EXAM: CT CHEST WITHOUT CONTRAST TECHNIQUE: Multidetector CT imaging of the chest was performed following the standard protocol without IV contrast. COMPARISON:  Chest CT 12/12/2020. FINDINGS: Cardiovascular: Heart size is normal. There is no significant pericardial fluid, thickening or pericardial calcification. There is aortic  atherosclerosis, as well as atherosclerosis of the great vessels of the mediastinum and the coronary arteries, including calcified atherosclerotic plaque in the left main, left anterior descending, left circumflex and right coronary arteries. Status post median sternotomy for CABG including LIMA to the LAD. Calcifications of the aortic valve. Mediastinum/Nodes: No pathologically enlarged mediastinal or hilar lymph nodes. Aortic atherosclerosis. Status post cholecystectomy. No axillary lymphadenopathy. Lungs/Pleura: Scattered areas of thickening of the peribronchovascular interstitium with cylindrical bronchiectasis and chronic volume loss and architectural distortion  in the lungs bilaterally, most pronounced in the right upper lobe and superior segment of the right lower lobe, compatible with postradiation mass-like fibrosis. Other areas of thickening of the peribronchovascular interstitium with peribronchovascular micro and macro nodularity, most evident in the basal portions of the right lung, similar to prior studies likely to reflect areas of chronic mucoid impaction within terminal bronchioles. A few other scattered small pulmonary nodules are noted elsewhere, largest of which measures only 4 mm in the left upper lobe (axial image 36 of series 5), stable. No acute consolidative airspace disease. Chronic thick-walled pleural effusion in the base of the right hemithorax with some pleural calcifications, similar to prior examinations. Mild diffuse bronchial wall thickening with mild centrilobular and paraseptal emphysema. Upper Abdomen: Aortic atherosclerosis. Aortic endograft in the abdominal aorta incompletely imaged. Status post cholecystectomy. Musculoskeletal: Median sternotomy wires. There are no aggressive appearing lytic or blastic lesions noted in the visualized portions of the skeleton. IMPRESSION: 1. Stable post treatment related changes of chronic postradiation mass-like fibrosis, similar to prior  examination. Likewise, the chronic right pleural effusion and widespread areas of mucoid impaction within terminal bronchioles in other portions of the right lung also appear stable compared to the prior examination. No definitive findings to suggest local recurrence of disease or definite metastatic disease in the thorax. 2. Aortic atherosclerosis, in addition to left main and 3 vessel coronary artery disease. Status post median sternotomy for CABG including LIMA to the LAD. 3. There are calcifications of the aortic valve. Echocardiographic correlation for evaluation of potential valvular dysfunction may be warranted if clinically indicated. 4. Mild diffuse bronchial wall thickening with mild centrilobular and paraseptal emphysema; imaging findings suggestive of underlying COPD. Aortic Atherosclerosis (ICD10-I70.0) and Emphysema (ICD10-J43.9). Electronically Signed   By: Vinnie Langton M.D.   On: 03/13/2021 15:29     ASSESSMENT AND PLAN:  This is a very pleasant 80 years old white male with a stage IIIA non-small cell lung cancer status post a course of concurrent chemoradiation with weekly carboplatin and paclitaxel and he had a rough time with the treatment at that time. He did not receive consolidation chemotherapy. The patient has been in observation since that time and he is feeling fine with no concerning complaints except for the persistent cough productive of yellowish-greenish sputum. He had repeat CT scan of the chest performed recently.  I personally and independently reviewed his scan and discussed the result with the patient. His scan showed no concerning findings for disease progression. The scan also showed chronic right pleural effusion and widespread areas of mucoid impaction within terminal bronchioles in the portions of the right lung stable compared to the prior exam.  For the anemia, I will check his stool for Hemoccult.  I will also check an anemia panel today to identify the  etiology of his worsening hemoglobin and hematocrit. I recommended for the patient to continue on observation with repeat CT scan of the chest in 6 months. For the persistent cough with yellow-greenish sputum, I will start the patient empirically on Augmentin 875 mg p.o. twice daily for 7 days and he was also advised to follow-up with Dr. Elsworth Soho for any further recommendation. The patient was advised to call immediately if he has any other concerning symptoms in the interval. The patient voices understanding of current disease status and treatment options and is in agreement with the current care plan. All questions were answered. The patient knows to call the clinic with any problems, questions or concerns. We  can certainly see the patient much sooner if necessary.   Disclaimer: This note was dictated with voice recognition software. Similar sounding words can inadvertently be transcribed and may not be corrected upon review.

## 2021-03-20 ENCOUNTER — Ambulatory Visit: Payer: Medicare Other | Admitting: Physician Assistant

## 2021-03-20 ENCOUNTER — Ambulatory Visit (INDEPENDENT_AMBULATORY_CARE_PROVIDER_SITE_OTHER)
Admission: RE | Admit: 2021-03-20 | Discharge: 2021-03-20 | Disposition: A | Discharge status: Home / Self Care | Payer: Medicare Other | Source: Ambulatory Visit | Attending: Surgery | Admitting: Surgery

## 2021-03-20 ENCOUNTER — Other Ambulatory Visit: Payer: Self-pay

## 2021-03-20 ENCOUNTER — Ambulatory Visit (HOSPITAL_COMMUNITY)
Admission: RE | Admit: 2021-03-20 | Discharge: 2021-03-20 | Disposition: A | Discharge status: Home / Self Care | Payer: Medicare Other | Source: Ambulatory Visit | Attending: Surgery | Admitting: Surgery

## 2021-03-20 VITALS — BP 124/59 | HR 83 | Temp 97.8°F | Resp 20 | Ht 70.0 in | Wt 179.8 lb

## 2021-03-20 DIAGNOSIS — I714 Abdominal aortic aneurysm, without rupture, unspecified: Secondary | ICD-10-CM

## 2021-03-20 DIAGNOSIS — I6523 Occlusion and stenosis of bilateral carotid arteries: Secondary | ICD-10-CM | POA: Insufficient documentation

## 2021-03-20 DIAGNOSIS — Z95828 Presence of other vascular implants and grafts: Secondary | ICD-10-CM | POA: Diagnosis not present

## 2021-03-20 NOTE — Progress Notes (Signed)
Carotid Artery Follow-Up   VASCULAR SURGERY ASSESSMENT & PLAN:   Paul Wirt. is a 80 y.o. male who is s/p EVAR in 2014 for AAA by Dr. Kellie Simmering. He also had a known history of bilateral carotid artery stenosis. No previous stroke or TIA history.   Bilateral carotid artery stenosis: The patient has no symptoms referable to carotid artery stenosis.  Duplex examination today is stable as compared to 1 year ago.  We reviewed the signs and symptoms of stroke/TIA and advised the patient to call EMS should these occur.    AAA s/p EVAR. Denies abdominal/back pain or LE claudication. No increase in size of aorta as compared to 1 year ago. No evidence of endoleak. Continue optimal medical management of diabetes, hypertension and follow-up with primary care physician. Encouraged continued complete smoking cessation. Continue the following medications: aspirin, Plavix, statin Follow-up in 1 year with carotid and EVAR duplex ultrasound.  SUBJECTIVE:   The patient denies monocular blindness, slurred speech, facial drooping, extremity weakness or numbness. Denies abdominal/back pain or LE claudication.  PHYSICAL EXAM:   Vitals:   03/20/21 0959 03/20/21 1002  BP: 133/71 (!) 124/59  Pulse: 83   Resp: 20   Temp: 97.8 F (36.6 C)   TempSrc: Temporal   SpO2: 100%   Weight: 179 lb 12.8 oz (81.6 kg)   Height: 5\' 10"  (1.778 m)     General appearance: Well-developed, well-nourished in no apparent distress Neurologic: Alert and oriented x4, face symmetric, speech fluent. Cardiovascular: Heart rate and rhythm are regular.  Right 2+ DP pulse. Both feet warm and well perfused.  No carotid bruits. Respirations: Nonlabored Abdomen: No palpable pulsatile mass   NON-INVASIVE VASCULAR STUDIES   03/20/2021 Summary:  Abdominal Aorta: Patent endovascular aneurysm repair with no evidence of  endoleak. The largest diameter measurement is 3.56 cm. Previous diameter  measurement was 3.63 obtained on  03/16/2021.     Summary:  Right Carotid: Velocities in the right ICA are consistent with a 40-59% stenosis.   Left Carotid: Velocities in the left ICA are consistent with a 40-59%  stenosis. The ECA appears >50% stenosed.   Vertebrals: Bilateral vertebral arteries demonstrate antegrade flow   PROBLEM LIST:    The patient's past medical history, past surgical history, family history, social history, allergy list and medication list are reviewed.   CURRENT MEDS:    Current Outpatient Medications:    albuterol (VENTOLIN HFA) 108 (90 Base) MCG/ACT inhaler, INHALE 2 PUFFS BY MOUTH EVERY 4 TO 6 HOURS AS NEEDED FOR WHEEZING., Disp: 8.5 g, Rfl: 5   amoxicillin-clavulanate (AUGMENTIN) 875-125 MG tablet, Take 1 tablet by mouth 2 (two) times daily., Disp: 14 tablet, Rfl: 0   aspirin EC 81 MG tablet, Take 81 mg by mouth daily before breakfast. , Disp: , Rfl:    carvedilol (COREG) 12.5 MG tablet, Take 1.5 tablets (18.75 mg total) by mouth 2 (two) times daily with a meal., Disp: 180 tablet, Rfl: 3   clopidogrel (PLAVIX) 75 MG tablet, TAKE (1) TABLET BY MOUTH ONCE DAILY., Disp: 90 tablet, Rfl: 0   dapagliflozin propanediol (FARXIGA) 10 MG TABS tablet, Take 10 mg by mouth daily before breakfast., Disp: 30 tablet, Rfl: 11   eplerenone (INSPRA) 50 MG tablet, Take 1 tablet (50 mg total) by mouth every evening., Disp: 30 tablet, Rfl: 11   Fluticasone-Salmeterol (ADVAIR) 100-50 MCG/DOSE AEPB, Inhale 1 puff into the lungs 2 (two) times daily., Disp: , Rfl:    furosemide (LASIX) 40 MG tablet,  Take 0.5 tablets (20 mg total) by mouth daily., Disp: 30 tablet, Rfl: 1   ipratropium-albuterol (DUONEB) 0.5-2.5 (3) MG/3ML SOLN, INHALE 1 VIAL VIA NEBULIZER 4 TIMES DAILY., Disp: 360 mL, Rfl: 3   Multiple Vitamins-Minerals (CENTRUM SILVER ADULT 50+) TABS, Take 1 tablet by mouth daily with lunch. , Disp: , Rfl:    nitroGLYCERIN (NITROSTAT) 0.4 MG SL tablet, PLACE 1 TAB UNDER TONGUE EVERY 5 MIN IF NEEDED FOR CHEST PAIN.  MAY USE 3 TIMES.NO RELIEF CALL 911., Disp: 25 tablet, Rfl: 1   nystatin (MYCOSTATIN) 100000 UNIT/ML suspension, Take 5 mLs (500,000 Units total) by mouth 4 (four) times daily., Disp: 60 mL, Rfl: 3   omeprazole (PRILOSEC) 20 MG capsule, Take 1 capsule (20 mg total) by mouth daily., Disp: 90 capsule, Rfl: 1   rosuvastatin (CRESTOR) 20 MG tablet, TAKE ONE TABLET BY MOUTH ONCE DAILY., Disp: 90 tablet, Rfl: 3   sacubitril-valsartan (ENTRESTO) 97-103 MG, Take 0.5 tablets by mouth 2 (two) times daily., Disp: , Rfl:    REVIEW OF SYSTEMS:   [X]  denotes positive finding, [ ]  denotes negative finding Cardiac  Comments:  Chest pain or chest pressure:    Shortness of breath upon exertion:    Short of breath when lying flat:    Irregular heart rhythm:        Vascular    Pain in calf, thigh, or hip brought on by ambulation:    Pain in feet at night that wakes you up from your sleep:     Blood clot in your veins:    Leg swelling:         Pulmonary    Oxygen at home:    Productive cough:     Wheezing:         Neurologic    Sudden weakness in arms or legs:     Sudden numbness in arms or legs:     Sudden onset of difficulty speaking or slurred speech:    Temporary loss of vision in one eye:     Problems with dizziness:         Gastrointestinal    Blood in stool:     Vomited blood:         Genitourinary    Burning when urinating:     Blood in urine:        Psychiatric    Major depression:         Hematologic    Bleeding problems:    Problems with blood clotting too easily:        Skin    Rashes or ulcers:        Constitutional    Fever or chills:     Barbie Banner, PA-C  Office: [phone] 03/20/2021 Clinic MD: Trula Slade

## 2021-03-22 ENCOUNTER — Telehealth: Payer: Self-pay | Admitting: Internal Medicine

## 2021-03-22 NOTE — Telephone Encounter (Unsigned)
Scheduled per los. Called and left msg. Mailed printout  °

## 2021-03-23 ENCOUNTER — Other Ambulatory Visit: Payer: Self-pay

## 2021-03-23 ENCOUNTER — Ambulatory Visit (HOSPITAL_COMMUNITY)
Admission: RE | Admit: 2021-03-23 | Discharge: 2021-03-23 | Disposition: A | Discharge status: Home / Self Care | Payer: Medicare Other | Source: Ambulatory Visit | Attending: Cardiology | Admitting: Cardiology

## 2021-03-23 ENCOUNTER — Other Ambulatory Visit (HOSPITAL_COMMUNITY): Payer: Medicare Other

## 2021-03-23 DIAGNOSIS — I5042 Chronic combined systolic (congestive) and diastolic (congestive) heart failure: Secondary | ICD-10-CM | POA: Insufficient documentation

## 2021-03-23 LAB — BASIC METABOLIC PANEL
Anion gap: 7 (ref 5–15)
BUN: 18 mg/dL (ref 8–23)
CO2: 29 mmol/L (ref 22–32)
Calcium: 9 mg/dL (ref 8.9–10.3)
Chloride: 102 mmol/L (ref 98–111)
Creatinine, Ser: 1.76 mg/dL — ABNORMAL HIGH (ref 0.61–1.24)
GFR, Estimated: 38 mL/min — ABNORMAL LOW (ref >60)
Glucose, Bld: 92 mg/dL (ref 70–99)
Potassium: 4.5 mmol/L (ref 3.5–5.1)
Sodium: 138 mmol/L (ref 135–145)

## 2021-03-27 DIAGNOSIS — R609 Edema, unspecified: Secondary | ICD-10-CM | POA: Diagnosis not present

## 2021-03-28 ENCOUNTER — Telehealth (HOSPITAL_COMMUNITY): Payer: Self-pay | Admitting: *Deleted

## 2021-03-28 NOTE — Telephone Encounter (Signed)
Pt called to inquire about his lab work from last week. Advised bun/cr improved, pt thankful for info

## 2021-03-29 DIAGNOSIS — N179 Acute kidney failure, unspecified: Secondary | ICD-10-CM | POA: Diagnosis not present

## 2021-03-29 DIAGNOSIS — I509 Heart failure, unspecified: Secondary | ICD-10-CM | POA: Diagnosis not present

## 2021-03-29 DIAGNOSIS — E785 Hyperlipidemia, unspecified: Secondary | ICD-10-CM | POA: Diagnosis not present

## 2021-03-29 DIAGNOSIS — N183 Chronic kidney disease, stage 3 unspecified: Secondary | ICD-10-CM | POA: Diagnosis not present

## 2021-03-29 DIAGNOSIS — D649 Anemia, unspecified: Secondary | ICD-10-CM | POA: Diagnosis not present

## 2021-03-29 DIAGNOSIS — N2581 Secondary hyperparathyroidism of renal origin: Secondary | ICD-10-CM | POA: Diagnosis not present

## 2021-03-29 DIAGNOSIS — I129 Hypertensive chronic kidney disease with stage 1 through stage 4 chronic kidney disease, or unspecified chronic kidney disease: Secondary | ICD-10-CM | POA: Diagnosis not present

## 2021-03-30 ENCOUNTER — Other Ambulatory Visit: Payer: Self-pay | Admitting: Nephrology

## 2021-03-30 DIAGNOSIS — N183 Chronic kidney disease, stage 3 unspecified: Secondary | ICD-10-CM

## 2021-04-03 ENCOUNTER — Telehealth (HOSPITAL_COMMUNITY): Payer: Self-pay | Admitting: Vascular Surgery

## 2021-04-03 NOTE — Telephone Encounter (Signed)
Pt called stating on 7/20 lasix was changed to 20 instead of 40, he would like a nurse to call him to make sure he needs to keep doing the 20, he has no changes in weight .. please advise

## 2021-04-03 NOTE — Telephone Encounter (Signed)
Called pt and advised to take Lasix 20mg  daily and keep a weight chart. Pt verbalized understanding and thanked me for the call.

## 2021-04-06 ENCOUNTER — Ambulatory Visit
Admission: RE | Admit: 2021-04-06 | Discharge: 2021-04-06 | Disposition: A | Discharge status: Home / Self Care | Payer: Medicare Other | Source: Ambulatory Visit | Attending: Nephrology | Admitting: Nephrology

## 2021-04-06 ENCOUNTER — Other Ambulatory Visit: Payer: Self-pay

## 2021-04-06 DIAGNOSIS — N183 Chronic kidney disease, stage 3 unspecified: Secondary | ICD-10-CM

## 2021-04-13 DIAGNOSIS — J449 Chronic obstructive pulmonary disease, unspecified: Secondary | ICD-10-CM | POA: Diagnosis not present

## 2021-04-17 ENCOUNTER — Other Ambulatory Visit (HOSPITAL_COMMUNITY): Payer: Self-pay | Admitting: Cardiology

## 2021-04-17 MED ORDER — FUROSEMIDE 20 MG PO TABS: 20.0000 mg | ORAL_TABLET | Freq: Every day | ORAL | 6 refills | Status: DC

## 2021-04-28 ENCOUNTER — Other Ambulatory Visit (HOSPITAL_COMMUNITY): Payer: Self-pay | Admitting: Cardiology

## 2021-04-28 DIAGNOSIS — C3411 Malignant neoplasm of upper lobe, right bronchus or lung: Secondary | ICD-10-CM

## 2021-04-30 ENCOUNTER — Telehealth: Payer: Self-pay

## 2021-04-30 NOTE — Telephone Encounter (Signed)
Tried calling patient. Left VM for patient to call and schedule his medicare wellness visit with the office.

## 2021-05-02 ENCOUNTER — Encounter (HOSPITAL_COMMUNITY): Payer: Self-pay | Admitting: Cardiology

## 2021-05-02 ENCOUNTER — Other Ambulatory Visit: Payer: Self-pay

## 2021-05-02 ENCOUNTER — Ambulatory Visit (HOSPITAL_COMMUNITY)
Admission: RE | Admit: 2021-05-02 | Discharge: 2021-05-02 | Disposition: A | Discharge status: Home / Self Care | Payer: Medicare Other | Source: Ambulatory Visit | Attending: Cardiology | Admitting: Cardiology

## 2021-05-02 VITALS — BP 104/58 | HR 71 | Wt 177.6 lb

## 2021-05-02 DIAGNOSIS — Z79899 Other long term (current) drug therapy: Secondary | ICD-10-CM | POA: Insufficient documentation

## 2021-05-02 DIAGNOSIS — E785 Hyperlipidemia, unspecified: Secondary | ICD-10-CM | POA: Insufficient documentation

## 2021-05-02 DIAGNOSIS — N183 Chronic kidney disease, stage 3 unspecified: Secondary | ICD-10-CM | POA: Diagnosis not present

## 2021-05-02 DIAGNOSIS — I4510 Unspecified right bundle-branch block: Secondary | ICD-10-CM | POA: Diagnosis not present

## 2021-05-02 DIAGNOSIS — Z7984 Long term (current) use of oral hypoglycemic drugs: Secondary | ICD-10-CM | POA: Diagnosis not present

## 2021-05-02 DIAGNOSIS — Z8249 Family history of ischemic heart disease and other diseases of the circulatory system: Secondary | ICD-10-CM | POA: Diagnosis not present

## 2021-05-02 DIAGNOSIS — Z85118 Personal history of other malignant neoplasm of bronchus and lung: Secondary | ICD-10-CM | POA: Diagnosis not present

## 2021-05-02 DIAGNOSIS — Z7982 Long term (current) use of aspirin: Secondary | ICD-10-CM | POA: Insufficient documentation

## 2021-05-02 DIAGNOSIS — Z7902 Long term (current) use of antithrombotics/antiplatelets: Secondary | ICD-10-CM | POA: Insufficient documentation

## 2021-05-02 DIAGNOSIS — R42 Dizziness and giddiness: Secondary | ICD-10-CM | POA: Insufficient documentation

## 2021-05-02 DIAGNOSIS — I25810 Atherosclerosis of coronary artery bypass graft(s) without angina pectoris: Secondary | ICD-10-CM | POA: Diagnosis not present

## 2021-05-02 DIAGNOSIS — R062 Wheezing: Secondary | ICD-10-CM | POA: Diagnosis not present

## 2021-05-02 DIAGNOSIS — I130 Hypertensive heart and chronic kidney disease with heart failure and stage 1 through stage 4 chronic kidney disease, or unspecified chronic kidney disease: Secondary | ICD-10-CM | POA: Insufficient documentation

## 2021-05-02 DIAGNOSIS — I255 Ischemic cardiomyopathy: Secondary | ICD-10-CM | POA: Insufficient documentation

## 2021-05-02 DIAGNOSIS — Z87891 Personal history of nicotine dependence: Secondary | ICD-10-CM | POA: Diagnosis not present

## 2021-05-02 DIAGNOSIS — Z09 Encounter for follow-up examination after completed treatment for conditions other than malignant neoplasm: Secondary | ICD-10-CM | POA: Diagnosis not present

## 2021-05-02 DIAGNOSIS — J90 Pleural effusion, not elsewhere classified: Secondary | ICD-10-CM | POA: Insufficient documentation

## 2021-05-02 DIAGNOSIS — Z7951 Long term (current) use of inhaled steroids: Secondary | ICD-10-CM | POA: Insufficient documentation

## 2021-05-02 DIAGNOSIS — R0602 Shortness of breath: Secondary | ICD-10-CM | POA: Diagnosis not present

## 2021-05-02 DIAGNOSIS — I5042 Chronic combined systolic (congestive) and diastolic (congestive) heart failure: Secondary | ICD-10-CM | POA: Diagnosis not present

## 2021-05-02 DIAGNOSIS — J449 Chronic obstructive pulmonary disease, unspecified: Secondary | ICD-10-CM | POA: Diagnosis not present

## 2021-05-02 LAB — BASIC METABOLIC PANEL
Anion gap: 6 (ref 5–15)
BUN: 25 mg/dL — ABNORMAL HIGH (ref 8–23)
CO2: 27 mmol/L (ref 22–32)
Calcium: 8.8 mg/dL — ABNORMAL LOW (ref 8.9–10.3)
Chloride: 101 mmol/L (ref 98–111)
Creatinine, Ser: 1.8 mg/dL — ABNORMAL HIGH (ref 0.61–1.24)
GFR, Estimated: 37 mL/min — ABNORMAL LOW (ref >60)
Glucose, Bld: 95 mg/dL (ref 70–99)
Potassium: 4.3 mmol/L (ref 3.5–5.1)
Sodium: 134 mmol/L — ABNORMAL LOW (ref 135–145)

## 2021-05-02 LAB — LIPID PANEL
Cholesterol: 112 mg/dL (ref 0–200)
HDL: 29 mg/dL — ABNORMAL LOW (ref >40)
LDL Cholesterol: 57 mg/dL (ref 0–99)
Total CHOL/HDL Ratio: 3.9 RATIO
Triglycerides: 129 mg/dL (ref <150)
VLDL: 26 mg/dL (ref 0–40)

## 2021-05-02 NOTE — Progress Notes (Signed)
PCP: Dr. Wolfgang Phoenix HF Cardiology: Dr. Aundra Dubin  80 y.o. with history of COPD, CAD s/p CABG in 1996 and PCI to dLM and SVG-D in 7/15, ischemic cardiomyopathy, and non-small cell lung cancer presents for followup of CHF and dyspnea.  He was initially referred to [place] by Dr. Lenna Gilford after fall in EF and worsening dyspnea was noted.   Patient reports that his initial symptoms prior to CABG in 1996 were dyspnea.  He has never had any worrisome chest pain.  He did well initially after CABG and was golfing regularly and walking 2-3 miles/day up until around 3/17.  He had one episode of increased dyspnea in 2015 and had cath with PCI to distal LM and SVG-D.  In 3/17, he was diagnosed with NSCLC and was treated with radiation and chemotherapy.  Since then, he has had progressive exertional dyspnea.  He was noted on echo in 7/18 to have EF down to 20-25%.    RHC/LHC was done in 9/18, showing occluded SVG-small D, patent LIMA-LAD and SVG-PDA, 50% shelf-like stenosis distal left main (no intervention), filling pressure not elevated and cardiac output preserved.   Echo in 8/19 showed that EF remains 30-35%.  Echo in 2/21 showed EF 35%, mildly decreased RV systolic function.   Admitted 11/02/63 with A/C Systolic Heart Failure exacerbation. Diuresed with IV lasix and transitioned to Lasix 40 mg daily.  Echo in 5/22 showed stable EF 30-35%.   He returns today for followup of CHF.  Symptomatically stable.  Still wheezes from time to time.  No dyspnea walking on flat ground but gets short of breath walking up stairs or hills.  No chest pain.  Rare lightheadedness with standing, no falls. He is able to weed eat (though takes him a while) and mow his grass. He goes to the grocery store.  No orthopnea/PND.  Weight down 2 lbs.   ECG (personally reviewed): NSR, RBBB  Labs (9/17): LDL 90 Labs (7/18): K 3.5, creatinine 1.28, hgb 10.7 Labs (8/18): LDL 68 Labs (9/18): K 4.1, creatinine 1.2, hgb 11.1, plts 98 K Labs (10/18): K  4.1, creatinine 1.5 Labs (1/19): K 4.3, creatinine 1.54, LDL 80, HDL 49, LFTs normal, TSH normal Labs (4/19): K 4.3, creatinine 1.52, hgb 11.8 Labs (7/19): K 4.1, creatinine 1.5, LDL 75, LFTs normal, hgb 12.2 Labs (10/19): K 4.3, creatinine 1.53 Labs (12/19): K 4.1, creatinine 1.49 Labs (10/20): K 4.4, creatinine 1.67  Labs (1/21): LDL 66 Labs (12/20): K 4.1, creatinine 1.7 Labs (4/21): K 4.1, creatinine 1.71 Labs (5/21): K 4.5, creatinine 1.81 Labs (9/21): K 4.1, creatinine 1.88, LDL 59, HDL 41 Labs (4/22): K 4.4, creatinine 2.32 => 2.21, hgb 9.7 Labs (7/22): K 4.5, creatinine 1.76  PMH: 1. COPD: PFTs (6/17) with severe obstructive airways disease.  2. AAA: s/p repair with stent graft.  3. Non-small cell lung cancer: Stage IIIA, diagnosed 3/17.  He had radiation as well as chemotherapy with carboplatin and paclitaxel.   4. CKD stage 3 5. PNA in 6/17 6. Recurrent right pleural effusion: VATS with talc pleurodesis on right.  No malignant cells noted.  7. HTN 8. H/o THR 9. CAD: CABG 1996 with SVG-D, SVG-PDA, LIMA-LAD.   - LHC (7/15): Totally occluded RCA and LAD.  Severe distal LM stenosis.  Patent SVG-PDA, patent LIMA-LAD.  Severe disease SVG-D.  Patient had DES to distal left main and staged PCI of SVG-D.   - Cardiolite (7/17): EF 30%, prior infarction with no ischemia.  - LHC (9/18): occluded SVG-small D,  patent LIMA-LAD and SVG-PDA, 50% shelf-like stenosis distal left main (no intervention).  10. Chronic systolic CHF: Ischemic cardiomyopathy.   - Echo (6/17): EF 30-35%. - Echo (7/18): EF 20-25%, moderate RV dilation with mildly decreased RV systolic function.  - RHC (9/18): mean RA 3, PA 28/9 mean 18, mean PCWP 10, CI 4.05.  - Echo (8/19): EF 30-35%, mildly decreased RV systolic function.   - Echo (2/21): EF 35%, mildly decreased RV systolic function - Echo (5/22): EF 30-35%  11. Carotid stenosis: Carotid dopplers (7/20) with 40-59% BICA stenosis.  - Carotid dopplers (7/21):  40-59% BICA stenosis.  - Carotid dopplers (7/22): 40-59% BICA stenosis.   Social History   Socioeconomic History   Marital status: Married    Spouse name: Not on file   Number of children: 1   Years of education: Not on file   Highest education level: Not on file  Occupational History   Occupation: Retired    Comment: [employer]  Tobacco Use   Smoking status: Former    Packs/day: 1.50    Years: 30.00    Pack years: 45.00    Types: Cigarettes    Start date: 12/01/1956    Quit date: 01/08/1995    Years since quitting: 26.3   Smokeless tobacco: Never  Vaping Use   Vaping Use: Never used  Substance and Sexual Activity   Alcohol use: No    Alcohol/week: 0.0 standard drinks    Comment: 03/18/2014 "no alacohol since 1996"   Drug use: No   Sexual activity: Never  Other Topics Concern   Not on file  Social History Narrative   Not on file   Social Determinants of Health   Financial Resource Strain: Not on file  Food Insecurity: Not on file  Transportation Needs: Not on file  Physical Activity: Not on file  Stress: Not on file  Social Connections: Not on file  Intimate Partner Violence: Not on file   Family History  Problem Relation Age of Onset   Heart disease Father    Cancer Father        Lung   Arthritis Mother    Parkinsonism Mother    Arthritis Sister        Brother with rheumatoid arthritis   Hypertension Brother    Colon cancer Neg Hx    Colon polyps Neg Hx    ROS: All systems reviewed and negative except as per HPI.   Current Outpatient Medications  Medication Sig Dispense Refill   albuterol (VENTOLIN HFA) 108 (90 Base) MCG/ACT inhaler INHALE 2 PUFFS BY MOUTH EVERY 4 TO 6 HOURS AS NEEDED FOR WHEEZING. 8.5 g 5   aspirin EC 81 MG tablet Take 81 mg by mouth daily before breakfast.      carvedilol (COREG) 12.5 MG tablet Take 1.5 tablets (18.75 mg total) by mouth 2 (two) times daily with a meal. 180 tablet 3   clopidogrel (PLAVIX) 75 MG tablet TAKE  (1) TABLET BY MOUTH ONCE DAILY. 90 tablet 0   dapagliflozin propanediol (FARXIGA) 10 MG TABS tablet Take 10 mg by mouth daily before breakfast. 30 tablet 11   eplerenone (INSPRA) 50 MG tablet Take 1 tablet (50 mg total) by mouth every evening. 30 tablet 11   Fluticasone-Salmeterol (ADVAIR) 100-50 MCG/DOSE AEPB Inhale 1 puff into the lungs 2 (two) times daily.     furosemide (LASIX) 20 MG tablet Take 1 tablet (20 mg total) by mouth daily. 30 tablet 6   ipratropium-albuterol (DUONEB) 0.5-2.5 (3)  MG/3ML SOLN INHALE 1 VIAL VIA NEBULIZER 4 TIMES DAILY. 360 mL 3   Multiple Vitamins-Minerals (CENTRUM SILVER ADULT 50+) TABS Take 1 tablet by mouth daily with lunch.      nitroGLYCERIN (NITROSTAT) 0.4 MG SL tablet PLACE 1 TAB UNDER TONGUE EVERY 5 MIN IF NEEDED FOR CHEST PAIN. MAY USE 3 TIMES.NO RELIEF CALL 911. 25 tablet 1   omeprazole (PRILOSEC) 20 MG capsule Take 1 capsule (20 mg total) by mouth daily. 90 capsule 1   rosuvastatin (CRESTOR) 20 MG tablet TAKE ONE TABLET BY MOUTH ONCE DAILY. 90 tablet 3   sacubitril-valsartan (ENTRESTO) 97-103 MG Take 0.5 tablets by mouth 2 (two) times daily.     No current facility-administered medications for this encounter.   BP (!) 104/58   Pulse 71   Wt 80.6 kg (177 lb 9.6 oz)   SpO2 98%   BMI 25.48 kg/m   Wt Readings from Last 3 Encounters:  05/02/21 80.6 kg (177 lb 9.6 oz)  03/20/21 81.6 kg (179 lb 12.8 oz)  03/15/21 82.5 kg (181 lb 14.4 oz)   General: NAD Neck: JVP 7-8 cm, no thyromegaly or thyroid nodule.  Lungs: Occasional rhonchi.  CV: Nondisplaced PMI.  Heart regular S1/S2, no S3/S4, no murmur.  No peripheral edema.  No carotid bruit.  Normal pedal pulses.  Abdomen: Soft, nontender, no hepatosplenomegaly, no distention.  Skin: Intact without lesions or rashes.  Neurologic: Alert and oriented x 3.  Psych: Normal affect. Extremities: No clubbing or cyanosis.  HEENT: Normal.   Assessment/Plan: 1. Chronic systolic CHF: Ischemic cardiomyopathy.  Echo  in 7/18 with EF 20-25%, echo 8/19 with EF 30-35%.  Echo in 2/21 showed EF 35%.   Echo 12/2020 EF 30-35%. EF persistently low.  He has a RBBB so not good CRT candidate.  He has not been interested in getting an ICD, think this is reasonable given age and lung cancer history.  NYHA class III symptoms.  I think that symptoms are primarily due to COPD rather than CHF. He is not volume overloaded on exam.  - Continue lasix 20 mg daily. BMET today . - Continue Entresto 97/103 bid.  - Continue Coreg to 18.75  mg bid, not BP room to increase.  - Continue eplerenone 50 mg daily.   - Continue dapagliflozin 10 mg daily. 2. CAD: s/p CABG then PCI 7/15 to distal LM and SVG-D.  Anginal equivalent in the past appears to have been dyspnea, he has never had significant chest pain.  LHC (9/18) showed patent LIMA-LAD and SVG-PDA; occluded SVG-D; 50% shelf-like LM stenosis.  - Continue ASA 81, Plavix, and Crestor.   3. COPD: Severe COPD by PFTs in 6/17.  Based on the last RHC/LHC, I have suspected that COPD plays a major role in his mild ongoing dyspnea.  Stable dyspnea.  4. Hyperlipidemia: Check lipids today.   5. NSCLC: Follows with Dr. Earlie Server.  He has finished chemo/radiation. Cancer appeared quiescent on most recent scans with Dr. Earlie Server.   6. Right pleural effusion: This has been chronic. S/p VATS.  7. Carotid stenosis: Due for repeat dopplers in 7/23.  Followed at [facility].  8. CKD stage 3: BMET today.   Followup in 3 months with APP.    Loralie Champagne 05/02/2021

## 2021-05-02 NOTE — Patient Instructions (Signed)
EKG done today  Labs today We will only contact you if something comes back abnormal or we need to make some changes. Otherwise no news is good news!  Your physician recommends that you schedule a follow-up appointment in: 3 months with Dr Aundra Dubin  Please call office at [phone] option 2 if you have any questions or concerns.   At the [facility], you and your health needs are our priority. As part of our continuing mission to provide you with exceptional heart care, we have created designated Provider Care Teams. These Care Teams include your primary Cardiologist (physician) and Advanced Practice Providers (APPs- Physician Assistants and Nurse Practitioners) who all work together to provide you with the care you need, when you need it.   You may see any of the following providers on your designated Care Team at your next follow up: Dr Glori Bickers Dr Loralie Champagne Dr Patrice [name], NP Lyda Jester, [place] Ginnie Smart Audry Riles, PharmD   Please be sure to bring in all your medications bottles to every appointment.

## 2021-06-13 ENCOUNTER — Other Ambulatory Visit: Payer: Self-pay

## 2021-06-13 ENCOUNTER — Encounter: Payer: Self-pay | Admitting: Pulmonary Disease

## 2021-06-13 ENCOUNTER — Telehealth: Payer: Self-pay | Admitting: Pulmonary Disease

## 2021-06-13 ENCOUNTER — Ambulatory Visit (INDEPENDENT_AMBULATORY_CARE_PROVIDER_SITE_OTHER): Payer: Medicare Other | Admitting: Pulmonary Disease

## 2021-06-13 DIAGNOSIS — J449 Chronic obstructive pulmonary disease, unspecified: Secondary | ICD-10-CM | POA: Diagnosis not present

## 2021-06-13 DIAGNOSIS — J9611 Chronic respiratory failure with hypoxia: Secondary | ICD-10-CM | POA: Diagnosis not present

## 2021-06-13 DIAGNOSIS — C3411 Malignant neoplasm of upper lobe, right bronchus or lung: Secondary | ICD-10-CM | POA: Diagnosis not present

## 2021-06-13 MED ORDER — SODIUM CHLORIDE 3 % IN NEBU: INHALATION_SOLUTION | RESPIRATORY_TRACT | 12 refills | Status: DC | PRN

## 2021-06-13 NOTE — Telephone Encounter (Signed)
RA please advise if you are ok with signing the rx for the pt to pick up today in the [facility] office. He wants to take the rx for the albuterol nebulizer with him today to his appt at the [place].  thanks

## 2021-06-13 NOTE — Addendum Note (Signed)
Addended by: Fritzi Mandes D on: 06/13/2021 09:55 AM   Modules accepted: Orders

## 2021-06-13 NOTE — Assessment & Plan Note (Signed)
Oxygen level is 91%, he did not desaturate further on ambulation.  He would like to hold off on oxygen as much as possible.  He will monitor his saturations at home and call us if he ever dips into the 80s.  He has been at this level for a while. I offered him pulmonary rehab but he is resistant to either going to the center or home-based program, he would like to continue his activity as he does by himself

## 2021-06-13 NOTE — Assessment & Plan Note (Signed)
Continue Advair and duo nebs thrice daily. 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 He will continue on Mucinex. We discussed signs and symptoms of exacerbation Vaccinations are up-to-date

## 2021-06-13 NOTE — Progress Notes (Signed)
   Subjective:    Patient ID: Paul Ewing., male    DOB: [birth year], 80 y.o.   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 Present with exertion. Coughing "a little more than usual" non productive.     Last office visit 11/2020 for fever, treated with Levaquin for 10 days His creatinine had increased to 2.0, Lasix was decreased to 20 mg and creatinine has since decreased to 1.8, reviewed labs from 04/2021. Reviewed CHF visit 9/20 [place] with Dr. Aundra Dubin, no change in medications. Reviewed CT scan from April and July 2022. He is compliant with Advair and takes duo nebs thrice daily. Oxygen level runs between 91 and 94% at home, 91% today he feels like he is at his baseline would like to avoid oxygen is much as possible  Significant tests/ events reviewed 02/2021 CT chest postirradiation appearance with dense fibrotic consolidation and volume loss right upper lobe, loculated right small effusion with pleural  calcification Enlarged subcarinal lymph node stable for more than 2 years     04/09/2018-echocardiogram-LV ejection fraction 25 to 30%, diffuse hypokinesis,   02/01/2016-pulmonary function test- FVC 1.96 (45% predicted), postbronchodilator ratio of 67, postbronchodilator FEV1 1.64 (53% predicted, postbronchodilator response, significant mid flow reversibility, DLCO 58  Review of Systems neg for any significant sore throat, dysphagia, itching, sneezing, nasal congestion or excess/ purulent secretions, fever, chills, sweats, unintended wt loss, pleuritic or exertional cp,  hempoptysis, orthopnea pnd or change in chronic leg swelling. Also denies presyncope, palpitations, heartburn, abdominal pain, nausea, vomiting, diarrhea or change in bowel or urinary habits, dysuria,hematuria, rash, arthralgias, visual complaints, headache, numbness weakness or ataxia.     Objective:   Physical Exam  Gen. Pleasant, well-nourished, in no distress ENT - no thrush, no pallor/icterus,no post nasal drip Neck: No JVD, no thyromegaly, no carotid bruits Lungs: no use of accessory muscles, no dullness to percussion, crackles right anterior, no rhonchi  Cardiovascular: Rhythm regular, heart sounds  normal, no murmurs or gallops, no peripheral edema Musculoskeletal: No deformities, no cyanosis or clubbing        Assessment & Plan:

## 2021-06-13 NOTE — Assessment & Plan Note (Signed)
Plan is for surveillance CT in 6 months, January 2023

## 2021-06-13 NOTE — Patient Instructions (Signed)
  Ambulatory sat - oxygen level is low 91%  Hypertonic saline nebs to take once/ daily

## 2021-06-13 NOTE — Telephone Encounter (Signed)
Prescription printed and signed by RA. Available for pick up for patient. Patient notified. Will pick up after 1

## 2021-06-30 ENCOUNTER — Telehealth (HOSPITAL_COMMUNITY): Payer: Self-pay

## 2021-06-30 ENCOUNTER — Other Ambulatory Visit: Payer: Self-pay | Admitting: Pulmonary Disease

## 2021-06-30 MED ORDER — PREDNISONE 20 MG PO TABS: 40.0000 mg | ORAL_TABLET | Freq: Every day | ORAL | 0 refills | Status: DC

## 2021-06-30 MED ORDER — AZITHROMYCIN 250 MG PO TABS: ORAL_TABLET | ORAL | 0 refills | Status: AC

## 2021-06-30 NOTE — Telephone Encounter (Signed)
Error

## 2021-06-30 NOTE — Telephone Encounter (Signed)
Patient is returning phone call. Patient phone number is [phone].

## 2021-06-30 NOTE — Telephone Encounter (Signed)
Lm for patient.  Meds are pending within this encounter.

## 2021-06-30 NOTE — Telephone Encounter (Signed)
Pt states he has a lowgrade fever (99) and is couhging up green phelgm since Tuesday. Fever began yesterday. Has been taking tylenol for fever. Would like something called in for him. Pharmacy of choice is [facility]. Pt states we may leave detailed message if he does not answer.

## 2021-06-30 NOTE — Telephone Encounter (Signed)
Patient is aware of below recommendations and voiced his understanding.  Prednisone and zpak has been sent to preferred pharmacy. Nothing further needed.

## 2021-06-30 NOTE — Telephone Encounter (Signed)
Called and spoke with pt and he stated that since Tuesday he has been hacking up green/yellow sputum.  He stated that yesterday he started with a low grade temp of 99.  He denies any other symptoms and stated that he does not have chest tightness or wheezing at this time.  He stated he has a hx of PNA.  He wanted to get this taken care of before it gets to that.    RA is off today.  PM please advise. Thanks  Allergies  Allergen Reactions   Neomycin Hives   Cetirizine & Related Rash

## 2021-06-30 NOTE — Telephone Encounter (Signed)
Please call in Z-Pak and prednisone 40 mg a day for 5 days

## 2021-07-01 ENCOUNTER — Emergency Department (HOSPITAL_COMMUNITY)
Admission: EM | Admit: 2021-07-01 | Discharge: 2021-07-01 | Disposition: A | Discharge status: Home / Self Care | Payer: No Typology Code available for payment source | Attending: Emergency Medicine | Admitting: Emergency Medicine

## 2021-07-01 ENCOUNTER — Emergency Department (HOSPITAL_COMMUNITY): Payer: No Typology Code available for payment source

## 2021-07-01 ENCOUNTER — Encounter (HOSPITAL_COMMUNITY): Payer: Self-pay

## 2021-07-01 ENCOUNTER — Other Ambulatory Visit: Payer: Self-pay

## 2021-07-01 DIAGNOSIS — I2510 Atherosclerotic heart disease of native coronary artery without angina pectoris: Secondary | ICD-10-CM | POA: Diagnosis not present

## 2021-07-01 DIAGNOSIS — N183 Chronic kidney disease, stage 3 unspecified: Secondary | ICD-10-CM | POA: Diagnosis not present

## 2021-07-01 DIAGNOSIS — Z7982 Long term (current) use of aspirin: Secondary | ICD-10-CM | POA: Diagnosis not present

## 2021-07-01 DIAGNOSIS — J441 Chronic obstructive pulmonary disease with (acute) exacerbation: Secondary | ICD-10-CM | POA: Diagnosis not present

## 2021-07-01 DIAGNOSIS — Z7951 Long term (current) use of inhaled steroids: Secondary | ICD-10-CM | POA: Insufficient documentation

## 2021-07-01 DIAGNOSIS — Z955 Presence of coronary angioplasty implant and graft: Secondary | ICD-10-CM | POA: Insufficient documentation

## 2021-07-01 DIAGNOSIS — Z7902 Long term (current) use of antithrombotics/antiplatelets: Secondary | ICD-10-CM | POA: Insufficient documentation

## 2021-07-01 DIAGNOSIS — I130 Hypertensive heart and chronic kidney disease with heart failure and stage 1 through stage 4 chronic kidney disease, or unspecified chronic kidney disease: Secondary | ICD-10-CM | POA: Insufficient documentation

## 2021-07-01 DIAGNOSIS — Z85118 Personal history of other malignant neoplasm of bronchus and lung: Secondary | ICD-10-CM | POA: Diagnosis not present

## 2021-07-01 DIAGNOSIS — Z96642 Presence of left artificial hip joint: Secondary | ICD-10-CM | POA: Insufficient documentation

## 2021-07-01 DIAGNOSIS — Z87891 Personal history of nicotine dependence: Secondary | ICD-10-CM | POA: Diagnosis not present

## 2021-07-01 DIAGNOSIS — R0602 Shortness of breath: Secondary | ICD-10-CM | POA: Diagnosis present

## 2021-07-01 DIAGNOSIS — J449 Chronic obstructive pulmonary disease, unspecified: Secondary | ICD-10-CM | POA: Insufficient documentation

## 2021-07-01 DIAGNOSIS — I5043 Acute on chronic combined systolic (congestive) and diastolic (congestive) heart failure: Secondary | ICD-10-CM | POA: Insufficient documentation

## 2021-07-01 DIAGNOSIS — Z20822 Contact with and (suspected) exposure to covid-19: Secondary | ICD-10-CM | POA: Insufficient documentation

## 2021-07-01 DIAGNOSIS — Z79899 Other long term (current) drug therapy: Secondary | ICD-10-CM | POA: Insufficient documentation

## 2021-07-01 LAB — BASIC METABOLIC PANEL
Anion gap: 6 (ref 5–15)
BUN: 26 mg/dL — ABNORMAL HIGH (ref 8–23)
CO2: 27 mmol/L (ref 22–32)
Calcium: 8.6 mg/dL — ABNORMAL LOW (ref 8.9–10.3)
Chloride: 101 mmol/L (ref 98–111)
Creatinine, Ser: 1.72 mg/dL — ABNORMAL HIGH (ref 0.61–1.24)
GFR, Estimated: 39 mL/min — ABNORMAL LOW (ref >60)
Glucose, Bld: 101 mg/dL — ABNORMAL HIGH (ref 70–99)
Potassium: 4.4 mmol/L (ref 3.5–5.1)
Sodium: 134 mmol/L — ABNORMAL LOW (ref 135–145)

## 2021-07-01 LAB — RESP PANEL BY RT-PCR (FLU A&B, COVID) ARPGX2
Influenza A by PCR: NEGATIVE
Influenza B by PCR: NEGATIVE
SARS Coronavirus 2 by RT PCR: NEGATIVE

## 2021-07-01 LAB — CBC
HCT: 31 % — ABNORMAL LOW (ref 39.0–52.0)
Hemoglobin: 9.8 g/dL — ABNORMAL LOW (ref 13.0–17.0)
MCH: 27.5 pg (ref 26.0–34.0)
MCHC: 31.6 g/dL (ref 30.0–36.0)
MCV: 87.1 fL (ref 80.0–100.0)
Platelets: 143 10*3/uL — ABNORMAL LOW (ref 150–400)
RBC: 3.56 MIL/uL — ABNORMAL LOW (ref 4.22–5.81)
RDW: 15.2 % (ref 11.5–15.5)
WBC: 7.1 10*3/uL (ref 4.0–10.5)
nRBC: 0 % (ref 0.0–0.2)

## 2021-07-01 LAB — BRAIN NATRIURETIC PEPTIDE: B Natriuretic Peptide: 785.4 pg/mL — ABNORMAL HIGH (ref 0.0–100.0)

## 2021-07-01 NOTE — Discharge Instructions (Signed)
Take the steroid and antibiotics that were prescribed for you.  Follow-up with your doctor to be rechecked.  Return to the ED as needed for worsening symptoms

## 2021-07-01 NOTE — ED Provider Notes (Signed)
[facility] DEPT Provider Note   CSN: [id number] Arrival date & time: 07/01/21  0845     History Chief Complaint  Patient presents with   Shortness of Breath    Paul Sandoval. is a 80 y.o. male.   Shortness of Breath  Patient presents to the ED for evaluation of shortness of breath.  Patient states that he started noticing symptoms a couple of days ago.  He was becoming more short of breath.  He started coughing more and was bringing up yellow-colored mucus.  He called his doctor and was given a prescription for prednisone and azithromycin.  Last evening he started having more shortness of breath.  He had a difficult time sleeping.  He did take a breathing treatment and did notice some improvement.  He was concerned so he decided to come to the ED for evaluation today.  He is not having any chest pain.  He is not having any leg swelling.  He denies any high fevers.  He took his temperature yesterday was 99.  Past Medical History:  Diagnosis Date   AAA (abdominal aortic aneurysm) 2010   4.4 cm 08/2008;4.44 in 7/10 and 4.65 in 08/2009; 4.8 by CT in 11/2009; 4.3 by ultrasound in 08/2010   Anemia    Arteriosclerotic cardiovascular disease (ASCVD) 1996   CABG-1996   Arthritis    "fingers" (03/18/2014)   CAD (coronary artery disease)    03/18/14:  PCI with DES to distal left main. 7/29: DES to the SVG to Diag   Cancer [facility])    Upper right lobe lung cancer   Cardiomyopathy, ischemic    Echo 03/17/14: EF 45-50%   CHF (congestive heart failure) (HCC)    Chronic bronchitis (HCC)    Chronic kidney disease    CRF   Chronic rhinitis    Colonic polyp 2002   polypectomy in 2002   COPD (chronic obstructive pulmonary disease) ([place])    Diverticulosis    Dyspnea    with exertion   ED (erectile dysfunction)    Encounter for antineoplastic chemotherapy 12/19/2015   GERD (gastroesophageal reflux disease)    History of blood transfusion    Hyperlipidemia     Hypertension    IFG (impaired fasting glucose)    Myocardial infarction [facility])    "told h/o silent MI sometime before 1996"   Pneumonia ~ 2001; ~ 2005    has had more than twice   Right bundle branch block    Tobacco abuse, in remission    40 pack year total consumption; discontinued in 1996    Patient Active Problem List   Diagnosis Date Noted   Chronic respiratory failure with hypoxia ([place]) 06/13/2021   Acute on chronic heart failure (HCC)    Acute on chronic systolic CHF (congestive heart failure) ([place]) 12/24/2020   Cough 06/17/2020   Bacterial colonization of lower respiratory tract 03/17/2019   History of thoracic surgery 04/24/2017   History of endovascular stent graft for abdominal aortic aneurysm (AAA) 01/02/2017   S/P CABG (coronary artery bypass graft) 11/05/2016   Cardiomyopathy, ischemic 11/05/2016   Chronic combined systolic and diastolic congestive heart failure ([place]) 08/06/2016   Pleural effusion on right    Acute on chronic combined systolic and diastolic heart failure ([place])    Hypoalbuminemia due to protein-calorie malnutrition ([place]) 02/21/2016   Fever 02/21/2016   Peripheral edema 02/17/2016   Acute on chronic renal failure ([place]) 02/05/2016   HTN (hypertension) 02/05/2016   AAA (  abdominal aortic aneurysm) without rupture 01/30/2016   Primary cancer of right upper lobe of lung ([place]) 11/24/2015   CKD (chronic kidney disease) stage 3, GFR 30-59 ml/min (HCC) 11/11/2015   Thrombocytopenia ([place]) 02/17/2015   Chronic obstructive airway disease with asthma ([place]) 02/17/2015   Other and unspecified angina pectoris 03/18/2014   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   Right carotid bruit 07/21/2013   CAD (coronary artery disease) 01/19/2013   AAA (abdominal aortic aneurysm) ([place]) 05/06/2012   Cerebrovascular disease 12/18/2011   GERD (gastroesophageal reflux disease)    Arteriosclerotic cardiovascular disease (ASCVD) 12/06/2009   Elevated lipids 12/05/2009    Essential hypertension 12/05/2009    Past Surgical History:  Procedure Laterality Date   ABDOMINAL AORTIC ANEURYSM REPAIR  11/2012   ABDOMINAL AORTIC ENDOVASCULAR STENT GRAFT N/A 12/11/2012   Procedure: ABDOMINAL AORTIC ENDOVASCULAR STENT GRAFT;  Surgeon: Mal Misty, MD;  Location: [facility];  Service: Vascular;  Laterality: N/A;  Ultrasound guided; [place]  01/08/1995   COLONOSCOPY  2002   polypectomy-patient denies   CORONARY ANGIOPLASTY WITH STENT PLACEMENT  03/18/2014   "1"   CORONARY ANGIOPLASTY WITH STENT PLACEMENT  03/24/2014   "1"   CORONARY ARTERY BYPASS GRAFT  01/09/1995   "CABG X3"   FLEXIBLE BRONCHOSCOPY N/A 03/01/2017   Procedure: FLEXIBLE BRONCHOSCOPY WITH BIOPSIES;  Surgeon: Gaye Pollack, MD;  Location: [place] OR;  Service: Thoracic;  Laterality: N/A;   JOINT REPLACEMENT     LAPAROSCOPIC CHOLECYSTECTOMY  12/2009   LEFT AND RIGHT HEART CATHETERIZATION WITH CORONARY/GRAFT ANGIOGRAM N/A 03/18/2014   Procedure: LEFT AND RIGHT HEART CATHETERIZATION WITH Beatrix Fetters;  Surgeon: Blane Ohara, MD;  Location[facility] CATH LAB;  Service: Cardiovascular;  Laterality: N/A;   PERCUTANEOUS CORONARY STENT INTERVENTION (PCI-S)  03/18/2014   Procedure: PERCUTANEOUS CORONARY STENT INTERVENTION (PCI-S);  Surgeon: Blane Ohara, MD;  Location: [facility] CATH LAB;  Service: Cardiovascular;;   PERCUTANEOUS CORONARY STENT INTERVENTION (PCI-S) N/A 03/24/2014   Procedure: PERCUTANEOUS CORONARY STENT INTERVENTION (PCI-S);  Surgeon: Blane Ohara, MD;  Location: [facility] CATH LAB;  Service: Cardiovascular;  Laterality: N/A;   PLEURAL EFFUSION DRAINAGE Right 03/01/2017   Procedure: DRAINAGE OF PLEURAL EFFUSION;  Surgeon: Gaye Pollack, MD;  Location: [place] OR;  Service: Thoracic;  Laterality: Right;   RIGHT/LEFT HEART CATH AND CORONARY/GRAFT ANGIOGRAPHY N/A 05/15/2017   Procedure: RIGHT/LEFT HEART CATH AND CORONARY/GRAFT ANGIOGRAPHY;  Surgeon: Larey Dresser, MD;  Location: [place]  CV LAB;  Service: Cardiovascular;  Laterality: N/A;   TALC PLEURODESIS Right 03/01/2017   Procedure: Pietro Cassis;  Surgeon: Gaye Pollack, MD;  Location: [place];  Service: Thoracic;  Laterality: Right;   TOTAL HIP ARTHROPLASTY Left 01/21/2013   Procedure: TOTAL HIP ARTHROPLASTY ANTERIOR APPROACH;  Surgeon: Mauri Pole, MD;  Location: [place];  Service: Orthopedics;  Laterality: Left;   VIDEO ASSISTED THORACOSCOPY Right 03/01/2017   Procedure: VIDEO ASSISTED THORACOSCOPY WITH BIOPSIES;  Surgeon: Gaye Pollack, MD;  Location: [place];  Service: Thoracic;  Laterality: Right;   VIDEO BRONCHOSCOPY N/A 11/17/2015   Procedure: VIDEO BRONCHOSCOPY WITH FLUORO;  Surgeon: Rigoberto Noel, MD;  Location: [place];  Service: Cardiopulmonary;  Laterality: N/A;       Family History  Problem Relation Age of Onset   Heart disease Father    Cancer Father        Lung   Arthritis Mother    Parkinsonism Mother    Arthritis Sister  Brother with rheumatoid arthritis   Hypertension Brother    Colon cancer Neg Hx    Colon polyps Neg Hx     Social History   Tobacco Use   Smoking status: Former    Packs/day: 1.50    Years: 30.00    Pack years: 45.00    Types: Cigarettes    Start date: 12/01/1956    Quit date: 01/08/1995    Years since quitting: 26.4   Smokeless tobacco: Never  Vaping Use   Vaping Use: Never used  Substance Use Topics   Alcohol use: No    Alcohol/week: 0.0 standard drinks    Comment: 03/18/2014 "no alacohol since 1996"   Drug use: No    Home Medications Prior to Admission medications   Medication Sig Start Date End Date Taking? Authorizing Provider  acetaminophen (TYLENOL) 500 MG tablet Take 1,000 mg by mouth every 6 (six) hours as needed for fever.   Yes [provider]  albuterol (VENTOLIN HFA) 108 (90 Base) MCG/ACT inhaler INHALE 2 PUFFS BY MOUTH EVERY 4 TO 6 HOURS AS NEEDED FOR WHEEZING. Patient taking differently: Inhale 2 puffs into the lungs See admin  instructions. 2 puffs every 4 to 6 hours as needed for wheezing 01/25/21  Yes Lovena Le, Malena M, DO  aspirin EC 81 MG tablet Take 81 mg by mouth daily before breakfast.    Yes [provider]  azithromycin (ZITHROMAX) 250 MG tablet Take 2 tablets (500 mg) on  Day 1,  followed by 1 tablet (250 mg) once daily on Days 2 through 5. Patient taking differently: Take 250-500 mg by mouth See admin instructions. 560m on day 1, then 252mdaily for 4 days 06/30/21 07/05/21 Yes Mannam, Praveen, MD  carvedilol (COREG) 12.5 MG tablet Take 1.5 tablets (18.75 mg total) by mouth 2 (two) times daily with a meal. 02/14/21  Yes McLarey DresserMD  clopidogrel (PLAVIX) 75 MG tablet TAKE (1) TABLET BY MOUTH ONCE DAILY. Patient taking differently: Take 75 mg by mouth daily. 04/28/21  Yes McLarey DresserMD  dapagliflozin propanediol (FARXIGA) 10 MG TABS tablet Take 10 mg by mouth daily before breakfast. 08/27/19  Yes McLarey DresserMD  eplerenone (INSPRA) 50 MG tablet Take 1 tablet (50 mg total) by mouth every evening. 04/05/20  Yes McLarey DresserMD  fluticasone ([facility] MCG/ACT nasal spray Place 1 spray into both nostrils daily as needed for allergies or rhinitis.   Yes [provider]  Fluticasone-Salmeterol (ADVAIR) 100-50 MCG/DOSE AEPB Inhale 1 puff into the lungs 2 (two) times daily.   Yes [provider]  furosemide (LASIX) 20 MG tablet Take 1 tablet (20 mg total) by mouth daily. 04/17/21  Yes McLarey DresserMD  guaiFENesin-dextromethorphan (RUh [facility] CenterM) 100-10 MG/5ML syrup Take 200 mLs by mouth every 4 (four) hours as needed for cough.   Yes [provider]  ipratropium-albuterol (DUONEB) 0.5-2.5 (3) MG/3ML SOLN INHALE 1 VIAL VIA NEBULIZER 4 TIMES DAILY. Patient taking differently: Take 3 mLs by nebulization in the morning, at noon, and at bedtime. May use an additional 76m21mf needed for wheezing or shortness of breath 11/18/18  Yes AlvRigoberto NoelD  Multiple  Vitamins-Minerals (CENTRUM SILVER ADULT 50+) TABS Take 1 tablet by mouth daily with lunch.    Yes [provider]  nitroGLYCERIN (NITROSTAT) 0.4 MG SL tablet PLACE 1 TAB UNDER TONGUE EVERY 5 MIN IF NEEDED FOR CHEST PAIN. MAY USE 3 TIMES.NO RELIEF CALL 911. Patient taking  differently: Place 0.4 mg under the tongue every 5 (five) minutes x 3 doses as needed for chest pain. 07/11/18  Yes Larey Dresser, MD  omeprazole (PRILOSEC) 20 MG capsule Take 1 capsule (20 mg total) by mouth daily. 01/05/21  Yes Lovena Le, Malena M, DO  predniSONE (DELTASONE) 20 MG tablet Take 2 tablets (40 mg total) by mouth daily with breakfast. 06/30/21  Yes Mannam, Praveen, MD  rosuvastatin (CRESTOR) 20 MG tablet TAKE ONE TABLET BY MOUTH ONCE DAILY. Patient taking differently: Take 20 mg by mouth daily. 12/21/20  Yes Larey Dresser, MD  sacubitril-valsartan (ENTRESTO) 97-103 MG Take 0.5 tablets by mouth 2 (two) times daily. 07/13/20  Yes [provider]  sodium chloride (OCEAN) 0.65 % SOLN nasal spray Place 1 spray into both nostrils as needed for congestion.   Yes [provider]  sodium chloride HYPERTONIC 3 % nebulizer solution Take by nebulization as needed for other. Patient taking differently: Take 4 mLs by nebulization daily as needed for cough. 06/13/21  Yes Rigoberto Noel, MD    Allergies    Neomycin and Cetirizine & related  Review of Systems   Review of Systems  Respiratory:  Positive for shortness of breath.   All other systems reviewed and are negative.  Physical Exam Updated Vital Signs BP 137/63   Pulse 84   Temp 98 F (36.7 C) (Oral)   Resp (!) 22   Ht 1.778 m (_0 )   Wt 78.9 kg   SpO2 98%   BMI 24.97 kg/m   Physical Exam Vitals and nursing note reviewed.  Constitutional:      General: He is not in acute distress.    Appearance: He is well-developed.  HENT:     Head: Normocephalic and atraumatic.     Right Ear: External ear normal.     Left Ear: External ear  normal.  Eyes:     General: No scleral icterus.       Right eye: No discharge.        Left eye: No discharge.     Conjunctiva/sclera: Conjunctivae normal.  Neck:     Trachea: No tracheal deviation.  Cardiovascular:     Rate and Rhythm: Normal rate and regular rhythm.  Pulmonary:     Effort: Pulmonary effort is normal. No respiratory distress.     Breath sounds: Normal breath sounds. No stridor. No wheezing, rhonchi or rales.  Abdominal:     General: Bowel sounds are normal. There is no distension.     Palpations: Abdomen is soft.     Tenderness: There is no abdominal tenderness. There is no guarding or rebound.  Musculoskeletal:        General: No tenderness or deformity.     Cervical back: Neck supple.  Skin:    General: Skin is warm and dry.     Findings: No rash.  Neurological:     General: No focal deficit present.     Mental Status: He is alert.     Cranial Nerves: No cranial nerve deficit (no facial droop, extraocular movements intact, no slurred speech).     Sensory: No sensory deficit.     Motor: No abnormal muscle tone or seizure activity.     Coordination: Coordination normal.  Psychiatric:        Mood and Affect: Mood normal.    ED Results / Procedures / Treatments   Labs (all labs ordered are listed, but only abnormal results are displayed) Labs Reviewed  BASIC METABOLIC PANEL - Abnormal; Notable for the following components:      Result Value   Sodium 134 (*)    Glucose, Bld 101 (*)    BUN 26 (*)    Creatinine, Ser 1.72 (*)    Calcium 8.6 (*)    GFR, Estimated 39 (*)    All other components within normal limits  CBC - Abnormal; Notable for the following components:   RBC 3.56 (*)    Hemoglobin 9.8 (*)    HCT 31.0 (*)    Platelets 143 (*)    All other components within normal limits  BRAIN NATRIURETIC PEPTIDE - Abnormal; Notable for the following components:   B Natriuretic Peptide 785.4 (*)    All other components within normal limits  RESP PANEL  BY RT-PCR (FLU A&B, COVID) [id number]    EKG EKG Interpretation  Date/Time:  Saturday July 01 2021 08:56:45 EDT Ventricular Rate:  89 PR Interval:  184 QRS Duration: 162 QT Interval:  396 QTC Calculation: 482 R Axis:   93 Text Interpretation: Sinus rhythm RBBB and LPFB No significant change since last tracing Confirmed by Dorie Rank [phone]) on 07/01/2021 9:58:15 AM  Radiology DG Chest Port 1 View  Result Date: 07/01/2021 CLINICAL DATA:  Dyspnea, productive cough, CHF EXAM: PORTABLE CHEST 1 VIEW COMPARISON:  12/24/2020 chest radiograph. FINDINGS: Intact sternotomy wires. CABG clips overlie the left mediastinum. Stable cardiomediastinal silhouette with mild cardiomegaly. No pneumothorax. Chronic masslike consolidation in the apical right lung. Chronic volume loss in the right hemithorax. Chronic blunting of the right costophrenic angle. Chronic streaky scarring at the right lung base. Clear left lung. No pulmonary edema. IMPRESSION: 1. Stable mild cardiomegaly without pulmonary edema. 2. Chronic post treatment changes in the right hemithorax. Electronically Signed   By: Ilona Sorrel M.D.   On: 07/01/2021 09:57    Procedures Procedures   Medications Ordered in ED Medications - No data to display  ED Course  I have reviewed the triage vital signs and the nursing notes.  Pertinent labs & imaging results that were available during my care of the patient were reviewed by me and considered in my medical decision making (see chart for details).  Clinical Course as of 07/01/21 1311  Sat Jul 01, 2021  1024 Brain natriuretic peptide(!) Elevated but similar to previous values [JK]  1025 CBC(!) Anemia noted.  Decreased from 3 months ago but similar to prior labs 6 months ago [JK]  2585 Basic metabolic panel(!) Creatinine elevated but similar to baseline [JK]  1025 DG Chest Port 1 View Chest x-ray without signs of pulmonary edema.  No pneumonia noted [JK]  1257 Covid and flu test are  negative [JK]    Clinical Course User Index [JK] Dorie Rank, MD   MDM Rules/Calculators/A&P                           Patient presented to ED with complaints of cough congestion and shortness of breath.  Patient has history of COPD.  He also has history of shortness of CHF.  Patient has had some mucopurulent sputum.  He called his doctor and was given a prescription for prednisone and azithromycin yesterday.  Patient states he was concerned and wanted to get checked this morning.  He does not have an oxygen requirement.  His lungs are clear and I do not hear any wheezing.  Patient does have an elevated BNP but this is actually similar to previous  values.  X-ray does not suggest pulmonary edema.  I doubt CHF exacerbation.  He is not having any significant wheezing to suggest severe COPD exacerbation but I suspect that is the etiology of his symptoms.  Patient otherwise appears comfortable.  No covid and flu.  I think it is reasonable to discharge home and continue with steroids and antibiotic prescription.  Warning signs precautions discussed Final Clinical Impression(s) / ED Diagnoses Final diagnoses:  COPD exacerbation [facility])    Rx / DC Orders ED Discharge Orders     None        Dorie Rank, MD 07/01/21 1311

## 2021-07-01 NOTE — ED Triage Notes (Signed)
Pt. Arrived POV for SOB. He states that he has been feeling changes in his breathing patterns the past 2 day. He called his doctor and they told him to come into the ED.

## 2021-07-04 ENCOUNTER — Ambulatory Visit (INDEPENDENT_AMBULATORY_CARE_PROVIDER_SITE_OTHER): Payer: Medicare Other | Admitting: Family Medicine

## 2021-07-04 ENCOUNTER — Other Ambulatory Visit: Payer: Self-pay

## 2021-07-04 DIAGNOSIS — J4489 Other specified chronic obstructive pulmonary disease: Secondary | ICD-10-CM

## 2021-07-04 DIAGNOSIS — E785 Hyperlipidemia, unspecified: Secondary | ICD-10-CM

## 2021-07-04 DIAGNOSIS — I5042 Chronic combined systolic (congestive) and diastolic (congestive) heart failure: Secondary | ICD-10-CM | POA: Diagnosis not present

## 2021-07-04 DIAGNOSIS — D649 Anemia, unspecified: Secondary | ICD-10-CM | POA: Diagnosis not present

## 2021-07-04 DIAGNOSIS — J449 Chronic obstructive pulmonary disease, unspecified: Secondary | ICD-10-CM

## 2021-07-04 DIAGNOSIS — I10 Essential (primary) hypertension: Secondary | ICD-10-CM

## 2021-07-04 NOTE — Assessment & Plan Note (Signed)
Stable at this time.  No evidence of exacerbation at this time.  Continue current pharmacotherapy.

## 2021-07-04 NOTE — Patient Instructions (Signed)
Call with concerns.  Continue your meds.  Follow up 6 months or sooner if needed.  Take care  Dr. Lacinda Axon

## 2021-07-04 NOTE — Progress Notes (Addendum)
Subjective:  Patient ID: Paul Sandoval., male    DOB: October 19, 1940  Age: 80 y.o. MRN: [id number]  CC: Chief Complaint  Patient presents with   Establish Care    Recent chest congestion and Tx w/ prednisone, azithromycin    HPI:  80 year old male with a complicated and extensive past medical history as outlined below presents to establish care with me.  Combined diastolic and systolic heart failure Stable currently.  Follows with cardiology Dr. Aundra Dubin. Is compliant with medications: Faxiga, carvedilol, eplerenone, Lasix, Entresto.  Hyperlipidemia Patient has recent lipid panel is at goal.  Patient has longstanding cardiovascular history. Compliant with Crestor with no adverse effects.  COPD Currently on antibiotic therapy as well as prednisone due to recent infection.  Follows closely with pulmonology Dr. Elsworth Soho. Compliant with DuoNebs, Advair.  Recent addition of hypertonic saline.  Hypertension Well-controlled on carvedilol, Lasix, eplerenone, Entresto.  Anemia & Lung CA Lung cancer stable at this time.  Follows closely with oncology with every 72-month CT scans. Last hemoglobin was 9.8.  This is likely anemia of chronic disease.  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 ([place]) 02/21/2016   Primary cancer of right upper lobe of lung ([place]) 11/24/2015   CKD (chronic kidney disease) stage 3, GFR 30-59 ml/min (HCC) 11/11/2015   Thrombocytopenia ([place]) 02/17/2015   Chronic obstructive  airway disease with asthma ([place]) 02/17/2015   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   CAD (coronary artery disease) 01/19/2013   Cerebrovascular disease 12/18/2011   GERD (gastroesophageal reflux disease)    Arteriosclerotic cardiovascular disease (ASCVD) 12/06/2009   Hyperlipidemia 12/05/2009   Essential hypertension 12/05/2009    Social Hx   Social History   Socioeconomic History   Marital status: Married    Spouse name: Not on file   Number of children: 1   Years of education: Not on file   Highest education level: Not on file  Occupational History   Occupation: Retired    Comment: [employer]  Tobacco Use   Smoking status: Former    Packs/day: 1.50    Years: 30.00    Pack years: 45.00    Types: Cigarettes    Start date: 12/01/1956    Quit date: 01/08/1995    Years since quitting: 26.5   Smokeless tobacco: Never  Vaping Use   Vaping Use: Never used  Substance and Sexual Activity   Alcohol use: No    Alcohol/week: 0.0 standard drinks    Comment: 03/18/2014 "no alacohol since 1996"   Drug use: No   Sexual activity: Never  Other Topics Concern   Not on file  Social History Narrative   Not on file   Social Determinants of Health   Financial Resource Strain: Not on file  Food Insecurity: Not on file  Transportation Needs: Not on file  Physical Activity: Not on file  Stress: Not on file  Social Connections: Not on file    Review of Systems  Constitutional:  Negative for fever.  Respiratory:  Positive for cough and shortness of breath.  Cardiovascular:  Negative for leg swelling.    Objective:  BP 117/66   Pulse 72   Temp (!) 97.4 F (36.3 C)   Ht 5\' 10"  (1.778 m)   Wt 183 lb (83 kg)   SpO2 97%   BMI 26.26 kg/m   BP/Weight 07/04/2021 07/01/2021 67/59/1638  Systolic BP 466 599 357  Diastolic BP 66 63 72  Wt. (Lbs) 183 174 183.08  BMI 26.26 24.97 26.27    Physical Exam Vitals and nursing note reviewed.  Constitutional:       General: He is not in acute distress. HENT:     Head: Normocephalic and atraumatic.  Eyes:     General:        Right eye: No discharge.        Left eye: No discharge.     Conjunctiva/sclera: Conjunctivae normal.  Cardiovascular:     Rate and Rhythm: Normal rate and regular rhythm.     Comments: No significant lower extremity edema. Pulmonary:     Effort: Pulmonary effort is normal.     Comments: Rhonchi throughout. Abdominal:     General: There is no distension.     Palpations: Abdomen is soft.     Tenderness: There is no abdominal tenderness.  Neurological:     Mental Status: He is alert.  Psychiatric:        Mood and Affect: Mood normal.        Behavior: Behavior normal.    Lab Results  Component Value Date   WBC 7.1 07/01/2021   HGB 9.8 (L) 07/01/2021   HCT 31.0 (L) 07/01/2021   PLT 143 (L) 07/01/2021   GLUCOSE 101 (H) 07/01/2021   CHOL 112 05/02/2021   TRIG 129 05/02/2021   HDL 29 (L) 05/02/2021   LDLCALC 57 05/02/2021   ALT 9 03/13/2021   AST 15 03/13/2021   NA 134 (L) 07/01/2021   K 4.4 07/01/2021   CL 101 07/01/2021   CREATININE 1.72 (H) 07/01/2021   BUN 26 (H) 07/01/2021   CO2 27 07/01/2021   TSH 2.66 09/19/2017   PSA 1.04 08/18/2014   INR 1.04 05/15/2017     Assessment & Plan:   Problem List Items Addressed This Visit       Cardiovascular and Mediastinum   Essential hypertension (Chronic)    Stable.  Continue current therapy.  Follows closely with cardiology.      Chronic combined systolic and diastolic congestive heart failure (HCC) (Chronic)    Stable at this time.  No evidence of exacerbation at this time.  Continue current pharmacotherapy.        Respiratory   Chronic obstructive airway disease with asthma (HCC) (Chronic)    Recent flare.  Advised to finish antibiotic and prednisone. Follows closely with pulmonology.        Other   Hyperlipidemia (Chronic)    At goal.  Continue Crestor.      Anemia    Anemia chronic disease.   We will continue to follow closely.       Follow-up:  Return in about 6 months (around 01/01/2022) for Follow up Chronic medical issues.  [place]

## 2021-07-04 NOTE — Assessment & Plan Note (Signed)
At goal.  Continue Crestor.

## 2021-07-04 NOTE — Assessment & Plan Note (Signed)
Recent flare.  Advised to finish antibiotic and prednisone. Follows closely with pulmonology.

## 2021-07-04 NOTE — Assessment & Plan Note (Signed)
Anemia chronic disease.  We will continue to follow closely.

## 2021-07-04 NOTE — Assessment & Plan Note (Signed)
Stable.  Continue current therapy.  Follows closely with cardiology.

## 2021-07-12 ENCOUNTER — Other Ambulatory Visit: Payer: Self-pay | Admitting: Internal Medicine

## 2021-07-13 ENCOUNTER — Other Ambulatory Visit: Payer: Self-pay | Admitting: Internal Medicine

## 2021-07-14 ENCOUNTER — Telehealth: Payer: Self-pay | Admitting: Pulmonary Disease

## 2021-07-14 MED ORDER — NYSTATIN 100000 UNIT/ML MT SUSP: 5.0000 mL | Freq: Four times a day (QID) | OROMUCOSAL | 0 refills | Status: AC

## 2021-07-14 NOTE — Telephone Encounter (Signed)
Pt calling to get Nystatin magic mouthwash refilled. Pt states Ramaswamy ordered ths back in July. Pt has sores in his mouth again and thinks he needs it. Please advise [phone] an appt 10:30 and will be back about 1:30 if you don't get him) [place]

## 2021-07-14 NOTE — Telephone Encounter (Signed)
Call returned to patient, confirmed DOB. Made aware medication has been sent in.   Nothing further needed at this time.

## 2021-07-14 NOTE — Telephone Encounter (Signed)
 #   Oral thrush - For Oral thrush: Take Suspension (swish and swallow): 500,000 units 4 times/day for 5 days; swish in the mouth and retain for as long as possible (several minutes) before swallowing. IF nystatin is in back order: alternatives are

## 2021-07-14 NOTE — Telephone Encounter (Signed)
LMTCB   MR please advise. Thanks :)

## 2021-07-24 ENCOUNTER — Other Ambulatory Visit (HOSPITAL_COMMUNITY): Payer: Self-pay | Admitting: Cardiology

## 2021-07-24 DIAGNOSIS — C3411 Malignant neoplasm of upper lobe, right bronchus or lung: Secondary | ICD-10-CM

## 2021-07-27 DIAGNOSIS — J449 Chronic obstructive pulmonary disease, unspecified: Secondary | ICD-10-CM | POA: Diagnosis not present

## 2021-08-03 ENCOUNTER — Ambulatory Visit (HOSPITAL_COMMUNITY)
Admission: RE | Admit: 2021-08-03 | Discharge: 2021-08-03 | Disposition: A | Discharge status: Home / Self Care | Payer: Medicare Other | Source: Ambulatory Visit | Attending: Cardiology | Admitting: Cardiology

## 2021-08-03 ENCOUNTER — Other Ambulatory Visit: Payer: Self-pay

## 2021-08-03 ENCOUNTER — Encounter (HOSPITAL_COMMUNITY): Payer: Self-pay | Admitting: Cardiology

## 2021-08-03 VITALS — BP 102/58 | HR 77 | Wt 180.0 lb

## 2021-08-03 DIAGNOSIS — Z9181 History of falling: Secondary | ICD-10-CM | POA: Diagnosis not present

## 2021-08-03 DIAGNOSIS — Z7982 Long term (current) use of aspirin: Secondary | ICD-10-CM | POA: Diagnosis not present

## 2021-08-03 DIAGNOSIS — Z951 Presence of aortocoronary bypass graft: Secondary | ICD-10-CM | POA: Diagnosis not present

## 2021-08-03 DIAGNOSIS — Z7984 Long term (current) use of oral hypoglycemic drugs: Secondary | ICD-10-CM | POA: Diagnosis not present

## 2021-08-03 DIAGNOSIS — Z7902 Long term (current) use of antithrombotics/antiplatelets: Secondary | ICD-10-CM | POA: Insufficient documentation

## 2021-08-03 DIAGNOSIS — I130 Hypertensive heart and chronic kidney disease with heart failure and stage 1 through stage 4 chronic kidney disease, or unspecified chronic kidney disease: Secondary | ICD-10-CM | POA: Diagnosis not present

## 2021-08-03 DIAGNOSIS — I25810 Atherosclerosis of coronary artery bypass graft(s) without angina pectoris: Secondary | ICD-10-CM | POA: Insufficient documentation

## 2021-08-03 DIAGNOSIS — I5042 Chronic combined systolic (congestive) and diastolic (congestive) heart failure: Secondary | ICD-10-CM | POA: Diagnosis not present

## 2021-08-03 DIAGNOSIS — E785 Hyperlipidemia, unspecified: Secondary | ICD-10-CM | POA: Insufficient documentation

## 2021-08-03 DIAGNOSIS — I2510 Atherosclerotic heart disease of native coronary artery without angina pectoris: Secondary | ICD-10-CM | POA: Diagnosis not present

## 2021-08-03 DIAGNOSIS — C3490 Malignant neoplasm of unspecified part of unspecified bronchus or lung: Secondary | ICD-10-CM | POA: Diagnosis not present

## 2021-08-03 DIAGNOSIS — Z79899 Other long term (current) drug therapy: Secondary | ICD-10-CM | POA: Insufficient documentation

## 2021-08-03 DIAGNOSIS — N183 Chronic kidney disease, stage 3 unspecified: Secondary | ICD-10-CM | POA: Diagnosis not present

## 2021-08-03 DIAGNOSIS — Z923 Personal history of irradiation: Secondary | ICD-10-CM | POA: Diagnosis not present

## 2021-08-03 DIAGNOSIS — I6523 Occlusion and stenosis of bilateral carotid arteries: Secondary | ICD-10-CM | POA: Insufficient documentation

## 2021-08-03 DIAGNOSIS — J449 Chronic obstructive pulmonary disease, unspecified: Secondary | ICD-10-CM | POA: Insufficient documentation

## 2021-08-03 DIAGNOSIS — J90 Pleural effusion, not elsewhere classified: Secondary | ICD-10-CM | POA: Diagnosis not present

## 2021-08-03 DIAGNOSIS — I4510 Unspecified right bundle-branch block: Secondary | ICD-10-CM | POA: Diagnosis not present

## 2021-08-03 LAB — BASIC METABOLIC PANEL
Anion gap: 8 (ref 5–15)
BUN: 25 mg/dL — ABNORMAL HIGH (ref 8–23)
CO2: 27 mmol/L (ref 22–32)
Calcium: 8.9 mg/dL (ref 8.9–10.3)
Chloride: 100 mmol/L (ref 98–111)
Creatinine, Ser: 1.78 mg/dL — ABNORMAL HIGH (ref 0.61–1.24)
GFR, Estimated: 38 mL/min — ABNORMAL LOW (ref >60)
Glucose, Bld: 95 mg/dL (ref 70–99)
Potassium: 4.8 mmol/L (ref 3.5–5.1)
Sodium: 135 mmol/L (ref 135–145)

## 2021-08-03 MED ORDER — FUROSEMIDE 20 MG PO TABS: ORAL_TABLET | ORAL | 11 refills | Status: DC

## 2021-08-03 NOTE — Patient Instructions (Addendum)
Labs done today. We will contact you only if your labs are abnormal.  INCREASE Lasix to 40mg  (2 tablets) by mouth daily alternating with 20mg  (1 tablet) daily.   No other medication changes were made. Please continue all current medications as prescribed.  Your physician recommends that you schedule a follow-up appointment in: 10 days for a lab only appointment and in 3 months with Dr. Aundra Dubin.  If you have any questions or concerns before your next appointment please send [place] a message through [place] or call our office at [phone].    TO LEAVE A MESSAGE FOR THE NURSE SELECT OPTION 2, PLEASE LEAVE A MESSAGE INCLUDING: YOUR NAME DATE OF BIRTH CALL BACK NUMBER REASON FOR CALL**this is important as we prioritize the call backs  YOU WILL RECEIVE A CALL BACK THE SAME DAY AS LONG AS YOU CALL BEFORE 4:00 PM   Do the following things EVERYDAY: Weigh yourself in the morning before breakfast. Write it down and keep it in a log. Take your medicines as prescribed Eat low salt foods--Limit salt (sodium) to 2000 mg per day.  Stay as active as you can everyday Limit all fluids for the day to less than 2 liters   At the [facility], you and your health needs are our priority. As part of our continuing mission to provide you with exceptional heart care, we have created designated Provider Care Teams. These Care Teams include your primary Cardiologist (physician) and Advanced Practice Providers (APPs- Physician Assistants and Nurse Practitioners) who all work together to provide you with the care you need, when you need it.   You may see any of the following providers on your designated Care Team at your next follow up: Dr Glori Bickers Dr Haynes Kerns, NP Lyda Jester, [place] Audry Riles, PharmD   Please be sure to bring in all your medications bottles to every appointment.

## 2021-08-03 NOTE — Progress Notes (Signed)
PCP: Dr. Wolfgang Phoenix HF Cardiology: Dr. Aundra Dubin  80 y.o. with history of COPD, CAD s/p CABG in 1996 and PCI to dLM and SVG-D in 7/15, ischemic cardiomyopathy, and non-small cell lung cancer presents for followup of CHF and dyspnea.  He was initially referred to [place] by Dr. Lenna Gilford after fall in EF and worsening dyspnea was noted.   Patient reports that his initial symptoms prior to CABG in 1996 were dyspnea.  He has never had any worrisome chest pain.  He did well initially after CABG and was golfing regularly and walking 2-3 miles/day up until around 3/17.  He had one episode of increased dyspnea in 2015 and had cath with PCI to distal LM and SVG-D.  In 3/17, he was diagnosed with NSCLC and was treated with radiation and chemotherapy.  Since then, he has had progressive exertional dyspnea.  He was noted on echo in 7/18 to have EF down to 20-25%.    RHC/LHC was done in 9/18, showing occluded SVG-small D, patent LIMA-LAD and SVG-PDA, 50% shelf-like stenosis distal left main (no intervention), filling pressure not elevated and cardiac output preserved.   Echo in 8/19 showed that EF remains 30-35%.  Echo in 2/21 showed EF 35%, mildly decreased RV systolic function.   Admitted 08/27/73 with A/C Systolic Heart Failure exacerbation. Diuresed with IV lasix and transitioned to Lasix 40 mg daily.  Echo in 5/22 showed stable EF 30-35%.   He had a COPD exacerbation in 11/22.    He returns today for followup of CHF.  He is mildly short of breath walking in the grocery store and walking up stairs.  No problems walking into the office today. Able to do yardwork slowly.  Able to make the bed.  Main limitation seems to be left hip pain.  No lightheadedness.  No chest pain.     Labs (9/17): LDL 90 Labs (7/18): K 3.5, creatinine 1.28, hgb 10.7 Labs (8/18): LDL 68 Labs (9/18): K 4.1, creatinine 1.2, hgb 11.1, plts 98 K Labs (10/18): K 4.1, creatinine 1.5 Labs (1/19): K 4.3, creatinine 1.54, LDL 80, HDL 49, LFTs normal,  TSH normal Labs (4/19): K 4.3, creatinine 1.52, hgb 11.8 Labs (7/19): K 4.1, creatinine 1.5, LDL 75, LFTs normal, hgb 12.2 Labs (10/19): K 4.3, creatinine 1.53 Labs (12/19): K 4.1, creatinine 1.49 Labs (10/20): K 4.4, creatinine 1.67  Labs (1/21): LDL 66 Labs (12/20): K 4.1, creatinine 1.7 Labs (4/21): K 4.1, creatinine 1.71 Labs (5/21): K 4.5, creatinine 1.81 Labs (9/21): K 4.1, creatinine 1.88, LDL 59, HDL 41 Labs (4/22): K 4.4, creatinine 2.32 => 2.21, hgb 9.7 Labs (7/22): K 4.5, creatinine 1.76 Labs (11/22): K 4.4, creatinine 1.72, BNP 785, hgb 9.8, LDL 57, TGs 129  PMH: 1. COPD: PFTs (6/17) with severe obstructive airways disease.  2. AAA: s/p repair with stent graft.  3. Non-small cell lung cancer: Stage IIIA, diagnosed 3/17.  He had radiation as well as chemotherapy with carboplatin and paclitaxel.   4. CKD stage 3 5. PNA in 6/17 6. Recurrent right pleural effusion: VATS with talc pleurodesis on right.  No malignant cells noted.  7. HTN 8. H/o THR 9. CAD: CABG 1996 with SVG-D, SVG-PDA, LIMA-LAD.   - LHC (7/15): Totally occluded RCA and LAD.  Severe distal LM stenosis.  Patent SVG-PDA, patent LIMA-LAD.  Severe disease SVG-D.  Patient had DES to distal left main and staged PCI of SVG-D.   - Cardiolite (7/17): EF 30%, prior infarction with no ischemia.  - LHC (  9/18): occluded SVG-small D, patent LIMA-LAD and SVG-PDA, 50% shelf-like stenosis distal left main (no intervention).  10. Chronic systolic CHF: Ischemic cardiomyopathy.   - Echo (6/17): EF 30-35%. - Echo (7/18): EF 20-25%, moderate RV dilation with mildly decreased RV systolic function.  - RHC (9/18): mean RA 3, PA 28/9 mean 18, mean PCWP 10, CI 4.05.  - Echo (8/19): EF 30-35%, mildly decreased RV systolic function.   - Echo (2/21): EF 35%, mildly decreased RV systolic function - Echo (5/22): EF 30-35%  11. Carotid stenosis: Carotid dopplers (7/20) with 40-59% BICA stenosis.  - Carotid dopplers (7/21): 40-59% BICA  stenosis.  - Carotid dopplers (7/22): 40-59% BICA stenosis.   Social History   Socioeconomic History   Marital status: Married    Spouse name: Not on file   Number of children: 1   Years of education: Not on file   Highest education level: Not on file  Occupational History   Occupation: Retired    Comment: [employer]  Tobacco Use   Smoking status: Former    Packs/day: 1.50    Years: 30.00    Pack years: 45.00    Types: Cigarettes    Start date: 12/01/1956    Quit date: 01/08/1995    Years since quitting: 26.5   Smokeless tobacco: Never  Vaping Use   Vaping Use: Never used  Substance and Sexual Activity   Alcohol use: No    Alcohol/week: 0.0 standard drinks    Comment: 03/18/2014 "no alacohol since 1996"   Drug use: No   Sexual activity: Never  Other Topics Concern   Not on file  Social History Narrative   Not on file   Social Determinants of Health   Financial Resource Strain: Not on file  Food Insecurity: Not on file  Transportation Needs: Not on file  Physical Activity: Not on file  Stress: Not on file  Social Connections: Not on file  Intimate Partner Violence: Not on file   Family History  Problem Relation Age of Onset   Heart disease Father    Cancer Father        Lung   Arthritis Mother    Parkinsonism Mother    Arthritis Sister        Brother with rheumatoid arthritis   Hypertension Brother    Colon cancer Neg Hx    Colon polyps Neg Hx    ROS: All systems reviewed and negative except as per HPI.   Current Outpatient Medications  Medication Sig Dispense Refill   acetaminophen (TYLENOL) 500 MG tablet Take 1,000 mg by mouth every 6 (six) hours as needed for fever.     albuterol (VENTOLIN HFA) 108 (90 Base) MCG/ACT inhaler INHALE 2 PUFFS BY MOUTH EVERY 4 TO 6 HOURS AS NEEDED FOR WHEEZING. 8.5 g 5   aspirin EC 81 MG tablet Take 81 mg by mouth daily before breakfast.      carvedilol (COREG) 12.5 MG tablet Take 1.5 tablets (18.75 mg total) by  mouth 2 (two) times daily with a meal. 180 tablet 3   clopidogrel (PLAVIX) 75 MG tablet TAKE (1) TABLET BY MOUTH ONCE DAILY. 90 tablet 0   dapagliflozin propanediol (FARXIGA) 10 MG TABS tablet Take 10 mg by mouth daily before breakfast. 30 tablet 11   eplerenone (INSPRA) 50 MG tablet Take 1 tablet (50 mg total) by mouth every evening. 30 tablet 11   fluticasone (FLONASE) 50 MCG/ACT nasal spray Place 1 spray into both nostrils daily as needed for  allergies or rhinitis.     Fluticasone-Salmeterol (ADVAIR) 100-50 MCG/DOSE AEPB Inhale 1 puff into the lungs 2 (two) times daily.     guaiFENesin-dextromethorphan (ROBITUSSIN DM) 100-10 MG/5ML syrup Take 200 mLs by mouth every 4 (four) hours as needed for cough.     ipratropium-albuterol (DUONEB) 0.5-2.5 (3) MG/3ML SOLN Take 3 mLs by nebulization in the morning, at noon, and at bedtime.     Multiple Vitamins-Minerals (CENTRUM SILVER ADULT 50+) TABS Take 1 tablet by mouth daily with lunch.      nitroGLYCERIN (NITROSTAT) 0.4 MG SL tablet PLACE 1 TAB UNDER TONGUE EVERY 5 MIN IF NEEDED FOR CHEST PAIN. MAY USE 3 TIMES.NO RELIEF CALL 911. 25 tablet 1   omeprazole (PRILOSEC) 20 MG capsule Take 1 capsule (20 mg total) by mouth daily. 90 capsule 1   rosuvastatin (CRESTOR) 20 MG tablet TAKE ONE TABLET BY MOUTH ONCE DAILY. 90 tablet 3   sacubitril-valsartan (ENTRESTO) 97-103 MG Take 0.5 tablets by mouth 2 (two) times daily.     sodium chloride (OCEAN) 0.65 % SOLN nasal spray Place 1 spray into both nostrils as needed for congestion.     furosemide (LASIX) 20 MG tablet Take 40mg  (2 tablets) by mouth daily alternating with 20mg  (1 tablet) by mouth daily. 45 tablet 11   No current facility-administered medications for this encounter.   BP (!) 102/58   Pulse 77   Wt 81.6 kg (180 lb)   SpO2 96%   BMI 25.83 kg/m   Wt Readings from Last 3 Encounters:  08/03/21 81.6 kg (180 lb)  07/04/21 83 kg (183 lb)  07/01/21 78.9 kg (174 lb)   General: NAD Neck: JVP 8 cm, no  thyromegaly or thyroid nodule.  Lungs: Rhonchi bilaterally. Decreased BS at base on right.  CV: Nondisplaced PMI.  Heart regular S1/S2, no S3/S4, no murmur.  No peripheral edema.  No carotid bruit.  Normal pedal pulses.  Abdomen: Soft, nontender, no hepatosplenomegaly, no distention.  Skin: Intact without lesions or rashes.  Neurologic: Alert and oriented x 3.  Psych: Normal affect. Extremities: No clubbing or cyanosis.  HEENT: Normal.   Assessment/Plan: 1. Chronic systolic CHF: Ischemic cardiomyopathy.  Echo in 7/18 with EF 20-25%, echo 8/19 with EF 30-35%.  Echo in 2/21 showed EF 35%.  Echo 12/2020 EF 30-35%. EF persistently low.  He has a RBBB so not good CRT candidate.  He has not been interested in getting an ICD, think this is reasonable given age and lung cancer history.  NYHA class III symptoms.  I think that symptoms are primarily due to COPD rather than CHF, but he does look mildly volume overloaded on exam today.   - Increase Lasix to 40 mg daily alternating with 20 mg daily.  BMET today and in 10 days.   - Continue Entresto 97/103 bid.  - Continue Coreg to 18.75  mg bid, not BP room to increase.  - Continue eplerenone 50 mg daily.   - Continue dapagliflozin 10 mg daily. 2. CAD: s/p CABG then PCI 7/15 to distal LM and SVG-D.  Anginal equivalent in the past appears to have been dyspnea, he has never had significant chest pain.  LHC (9/18) showed patent LIMA-LAD and SVG-PDA; occluded SVG-D; 50% shelf-like LM stenosis.  - Continue ASA 81, Plavix.  - Continue Crestor, good LDL in 11/22.    3. COPD: Severe COPD by PFTs in 6/17.  Based on the last RHC/LHC, I have suspected that COPD plays a role in his ongoing  dyspnea.  4. Hyperlipidemia: Good lipids in 11/22 on Crestor.  5. NSCLC: Follows with Dr. Earlie Server.  He has finished chemo/radiation. Cancer appeared quiescent on most recent scans with Dr. Earlie Server.   6. Right pleural effusion: This has been chronic. S/p VATS.  7. Carotid stenosis:  Due for repeat dopplers in 7/23.  Followed at [facility].  8. CKD stage 3: BMET today.   Followup in 3 months  Loralie Champagne 08/03/2021

## 2021-08-06 ENCOUNTER — Other Ambulatory Visit: Payer: Self-pay | Admitting: Family Medicine

## 2021-08-07 ENCOUNTER — Other Ambulatory Visit: Payer: Self-pay

## 2021-08-07 MED ORDER — OMEPRAZOLE 20 MG PO CPDR: 20.0000 mg | DELAYED_RELEASE_CAPSULE | Freq: Every day | ORAL | 0 refills | Status: DC

## 2021-08-08 ENCOUNTER — Ambulatory Visit (INDEPENDENT_AMBULATORY_CARE_PROVIDER_SITE_OTHER): Payer: Medicare Other

## 2021-08-08 ENCOUNTER — Other Ambulatory Visit: Payer: Self-pay

## 2021-08-08 VITALS — Ht 70.0 in | Wt 183.0 lb

## 2021-08-08 DIAGNOSIS — Z0000 Encounter for general adult medical examination without abnormal findings: Secondary | ICD-10-CM

## 2021-08-08 NOTE — Patient Instructions (Signed)
[name] , Thank you for taking time to come for your Medicare Wellness Visit. I appreciate your ongoing commitment to your health goals. Please review the following plan we discussed and let me know if I can assist you in the future.   Screening recommendations/referrals: Colonoscopy: No longer required. Last done 01/16/2011.  Recommended yearly ophthalmology/optometry visit for glaucoma screening and checkup Recommended yearly dental visit for hygiene and checkup  Vaccinations: Influenza vaccine: Done 06/05/2021 Repeat annually  Pneumococcal vaccine: Done 03/27/2012 and 08/18/2014 Tdap vaccine: Done 07/05/2011 Repeat in 10 years  Shingles vaccine: Shingrix discussed. Please contact your pharmacy for coverage information.     Covid-19: Done 09/11/2019, 10/06/2019 and 06/21/2020  Advanced directives: Please bring a copy of your health care power of attorney and living will to the office to be added to your chart at your convenience.   Conditions/risks identified: Keep up the good work!!  Next appointment: Follow up in one year for your annual wellness visit. 2023.  Preventive Care 40 Years and Older, Male  Preventive care refers to lifestyle choices and visits with your health care provider that can promote health and wellness. What does preventive care include? A yearly physical exam. This is also called an annual well check. Dental exams once or twice a year. Routine eye exams. Ask your health care provider how often you should have your eyes checked. Personal lifestyle choices, including: Daily care of your teeth and gums. Regular physical activity. Eating a healthy diet. Avoiding tobacco and drug use. Limiting alcohol use. Practicing safe sex. Taking low doses of aspirin every day. Taking vitamin and mineral supplements as recommended by your health care provider. What happens during an annual well check? The services and screenings done by your health care provider during  your annual well check will depend on your age, overall health, lifestyle risk factors, and family history of disease. Counseling  Your health care provider may ask you questions about your: Alcohol use. Tobacco use. Drug use. Emotional well-being. Home and relationship well-being. Sexual activity. Eating habits. History of falls. Memory and ability to understand (cognition). Work and work Statistician. Screening  You may have the following tests or measurements: Height, weight, and BMI. Blood pressure. Lipid and cholesterol levels. These may be checked every 5 years, or more frequently if you are over 59 years old. Skin check. Lung cancer screening. You may have this screening every year starting at age 66 if you have a 30-pack-year history of smoking and currently smoke or have quit within the past 15 years. Fecal occult blood test (FOBT) of the stool. You may have this test every year starting at age 58. Flexible sigmoidoscopy or colonoscopy. You may have a sigmoidoscopy every 5 years or a colonoscopy every 10 years starting at age 82. Prostate cancer screening. Recommendations will vary depending on your family history and other risks. Hepatitis C blood test. Hepatitis B blood test. Sexually transmitted disease (STD) testing. Diabetes screening. This is done by checking your blood sugar (glucose) after you have not eaten for a while (fasting). You may have this done every 1-3 years. Abdominal aortic aneurysm (AAA) screening. You may need this if you are a current or former smoker. Osteoporosis. You may be screened starting at age 73 if you are at high risk. Talk with your health care provider about your test results, treatment options, and if necessary, the need for more tests. Vaccines  Your health care provider may recommend certain vaccines, such as: Influenza vaccine. This is  recommended every year. Tetanus, diphtheria, and acellular pertussis (Tdap, Td) vaccine. You may need  a Td booster every 10 years. Zoster vaccine. You may need this after age 22. Pneumococcal 13-valent conjugate (PCV13) vaccine. One dose is recommended after age 6. Pneumococcal polysaccharide (PPSV23) vaccine. One dose is recommended after age 46. Talk to your health care provider about which screenings and vaccines you need and how often you need them. This information is not intended to replace advice given to you by your health care provider. Make sure you discuss any questions you have with your health care provider. Document Released: 09/09/2015 Document Revised: 05/02/2016 Document Reviewed: 06/14/2015 Elsevier Interactive Patient Education  2017 [place] Prevention in the Home Falls can cause injuries. They can happen to people of all ages. There are many things you can do to make your home safe and to help prevent falls. What can I do on the outside of my home? Regularly fix the edges of walkways and driveways and fix any cracks. Remove anything that might make you trip as you walk through a door, such as a raised step or threshold. Trim any bushes or trees on the path to your home. Use bright outdoor lighting. Clear any walking paths of anything that might make someone trip, such as rocks or tools. Regularly check to see if handrails are loose or broken. Make sure that both sides of any steps have handrails. Any raised decks and porches should have guardrails on the edges. Have any leaves, snow, or ice cleared regularly. Use sand or salt on walking paths during winter. Clean up any spills in your garage right away. This includes oil or grease spills. What can I do in the bathroom? Use night lights. Install grab bars by the toilet and in the tub and shower. Do not use towel bars as grab bars. Use non-skid mats or decals in the tub or shower. If you need to sit down in the shower, use a plastic, non-slip stool. Keep the floor dry. Clean up any water that spills on the  floor as soon as it happens. Remove soap buildup in the tub or shower regularly. Attach bath mats securely with double-sided non-slip rug tape. Do not have throw rugs and other things on the floor that can make you trip. What can I do in the bedroom? Use night lights. Make sure that you have a light by your bed that is easy to reach. Do not use any sheets or blankets that are too big for your bed. They should not hang down onto the floor. Have a firm chair that has side arms. You can use this for support while you get dressed. Do not have throw rugs and other things on the floor that can make you trip. What can I do in the kitchen? Clean up any spills right away. Avoid walking on wet floors. Keep items that you use a lot in easy-to-reach places. If you need to reach something above you, use a strong step stool that has a grab bar. Keep electrical cords out of the way. Do not use floor polish or wax that makes floors slippery. If you must use wax, use non-skid floor wax. Do not have throw rugs and other things on the floor that can make you trip. What can I do with my stairs? Do not leave any items on the stairs. Make sure that there are handrails on both sides of the stairs and use them. Fix handrails that are  broken or loose. Make sure that handrails are as long as the stairways. Check any carpeting to make sure that it is firmly attached to the stairs. Fix any carpet that is loose or worn. Avoid having throw rugs at the top or bottom of the stairs. If you do have throw rugs, attach them to the floor with carpet tape. Make sure that you have a light switch at the top of the stairs and the bottom of the stairs. If you do not have them, ask someone to add them for you. What else can I do to help prevent falls? Wear shoes that: Do not have high heels. Have rubber bottoms. Are comfortable and fit you well. Are closed at the toe. Do not wear sandals. If you use a stepladder: Make sure that  it is fully opened. Do not climb a closed stepladder. Make sure that both sides of the stepladder are locked into place. Ask someone to hold it for you, if possible. Clearly mark and make sure that you can see: Any grab bars or handrails. First and last steps. Where the edge of each step is. Use tools that help you move around (mobility aids) if they are needed. These include: Canes. Walkers. Scooters. Crutches. Turn on the lights when you go into a dark area. Replace any light bulbs as soon as they burn out. Set up your furniture so you have a clear path. Avoid moving your furniture around. If any of your floors are uneven, fix them. If there are any pets around you, be aware of where they are. Review your medicines with your doctor. Some medicines can make you feel dizzy. This can increase your chance of falling. Ask your doctor what other things that you can do to help prevent falls. This information is not intended to replace advice given to you by your health care provider. Make sure you discuss any questions you have with your health care provider. Document Released: 06/09/2009 Document Revised: 01/19/2016 Document Reviewed: 09/17/2014 Elsevier Interactive Patient Education  2017 [employer].

## 2021-08-08 NOTE — Progress Notes (Signed)
Subjective:   Paul Sandoval. is a 80 y.o. male who presents for Medicare Annual/Subsequent preventive examination. Virtual Visit via Telephone Note  I connected with  Jolee Ewing. on 08/08/21 at 11:00 AM EST by telephone and verified that I am speaking with the correct person using two identifiers.  Location: Patient: Home Provider: [facility] Persons participating in the virtual visit: patient/Nurse Health Advisor   I discussed the limitations, risks, security and privacy concerns of performing an evaluation and management service by telephone and the availability of in person appointments. The patient expressed understanding and agreed to proceed.  Interactive audio and video telecommunications were attempted between this nurse and patient, however failed, due to patient having technical difficulties OR patient did not have access to video capability.  We continued and completed visit with audio only.  Some vital signs may be absent or patient reported.   Chriss Driver, LPN  Review of Systems     Cardiac Risk Factors include: advanced age (>50mn, >>69women);hypertension;dyslipidemia;male gender;sedentary lifestyle     Objective:    Today's Vitals   08/08/21 1105  Weight: 183 lb (83 kg)  Height: _0  (1.778 m)   Body mass index is 26.26 kg/m.  Advanced Directives 08/08/2021 07/01/2021 12/24/2020 12/24/2020 05/28/2017 05/24/2017 05/15/2017  Does Patient Have a Medical Advance Directive? Yes Yes Yes No Yes Yes Yes  Type of AParamedicof ATindallLiving will Living will Living will - HFort DepositLiving will HSnohomishLiving will [place]Living will  Does patient want to make changes to medical advance directive? - No - Patient declined - - - - No - Patient declined  Copy of [place] Chart? No - copy requested - - - No - copy requested - No - copy requested  Would  patient like information on creating a medical advance directive? No - Patient declined - - No - Patient declined - - -  Pre-existing out of facility DNR order (yellow form or pink MOST form) - - - - - - -    Current Medications (verified) Outpatient Encounter Medications as of 08/08/2021  Medication Sig   acetaminophen (TYLENOL) 500 MG tablet Take 1,000 mg by mouth every 6 (six) hours as needed for fever.   albuterol (VENTOLIN HFA) 108 (90 Base) MCG/ACT inhaler INHALE 2 PUFFS BY MOUTH EVERY 4 TO 6 HOURS AS NEEDED FOR WHEEZING.   aspirin EC 81 MG tablet Take 81 mg by mouth daily before breakfast.    carvedilol (COREG) 12.5 MG tablet Take 1.5 tablets (18.75 mg total) by mouth 2 (two) times daily with a meal.   clopidogrel (PLAVIX) 75 MG tablet TAKE (1) TABLET BY MOUTH ONCE DAILY.   dapagliflozin propanediol (FARXIGA) 10 MG TABS tablet Take 10 mg by mouth daily before breakfast.   eplerenone (INSPRA) 50 MG tablet Take 1 tablet (50 mg total) by mouth every evening.   fluticasone (FLONASE) 50 MCG/ACT nasal spray Place 1 spray into both nostrils daily as needed for allergies or rhinitis.   Fluticasone-Salmeterol (ADVAIR) 100-50 MCG/DOSE AEPB Inhale 1 puff into the lungs 2 (two) times daily.   furosemide (LASIX) 20 MG tablet Take 436m(2 tablets) by mouth daily alternating with 2039m1 tablet) by mouth daily.   guaiFENesin-dextromethorphan (ROBITUSSIN DM) 100-10 MG/5ML syrup Take 200 mLs by mouth every 4 (four) hours as needed for cough.   ipratropium-albuterol (DUONEB) 0.5-2.5 (3) MG/3ML SOLN Take 3 mLs  by nebulization in the morning, at noon, and at bedtime.   Multiple Vitamins-Minerals (CENTRUM SILVER ADULT 50+) TABS Take 1 tablet by mouth daily with lunch.    nitroGLYCERIN (NITROSTAT) 0.4 MG SL tablet PLACE 1 TAB UNDER TONGUE EVERY 5 MIN IF NEEDED FOR CHEST PAIN. MAY USE 3 TIMES.NO RELIEF CALL 911.   omeprazole (PRILOSEC) 20 MG capsule Take 1 capsule (20 mg total) by mouth daily.   rosuvastatin  (CRESTOR) 20 MG tablet TAKE ONE TABLET BY MOUTH ONCE DAILY.   sacubitril-valsartan (ENTRESTO) 97-103 MG Take 0.5 tablets by mouth 2 (two) times daily.   sodium chloride (OCEAN) 0.65 % SOLN nasal spray Place 1 spray into both nostrils as needed for congestion.   [DISCONTINUED] omeprazole (PRILOSEC) 20 MG capsule Take 1 capsule (20 mg total) by mouth daily.   No facility-administered encounter medications on file as of 08/08/2021.    Allergies (verified) Neomycin and Cetirizine & related   History: Past Medical History:  Diagnosis Date   AAA (abdominal aortic aneurysm) 2010   4.4 cm 08/2008;4.44 in 7/10 and 4.65 in 08/2009; 4.8 by CT in 11/2009; 4.3 by ultrasound in 08/2010   Anemia    Arteriosclerotic cardiovascular disease (ASCVD) 1996   CABG-1996   Arthritis    "fingers" (03/18/2014)   CAD (coronary artery disease)    03/18/14:  PCI with DES to distal left main. 7/29: DES to the SVG to Diag   Cancer [facility])    Upper right lobe lung cancer   Cardiomyopathy, ischemic    Echo 03/17/14: EF 45-50%   CHF (congestive heart failure) (HCC)    Chronic bronchitis (HCC)    Chronic kidney disease    CRF   Chronic rhinitis    Colonic polyp 2002   polypectomy in 2002   COPD (chronic obstructive pulmonary disease) ([place])    Diverticulosis    Dyspnea    with exertion   ED (erectile dysfunction)    Encounter for antineoplastic chemotherapy 12/19/2015   GERD (gastroesophageal reflux disease)    History of blood transfusion    Hyperlipidemia    Hypertension    IFG (impaired fasting glucose)    Myocardial infarction [facility])    "told h/o silent MI sometime before 1996"   Pneumonia ~ 2001; ~ 2005    has had more than twice   Right bundle branch block    Tobacco abuse, in remission    40 pack year total consumption; discontinued in 1996   Past Surgical History:  Procedure Laterality Date   ABDOMINAL AORTIC ANEURYSM REPAIR  11/2012   ABDOMINAL AORTIC ENDOVASCULAR STENT GRAFT N/A 12/11/2012    Procedure: ABDOMINAL AORTIC ENDOVASCULAR STENT GRAFT;  Surgeon: Mal Misty, MD;  Location: [place];  Service: Vascular;  Laterality: N/A;  Ultrasound guided; [place]  01/08/1995   COLONOSCOPY  2002   polypectomy-patient denies   CORONARY ANGIOPLASTY WITH STENT PLACEMENT  03/18/2014   "1"   CORONARY ANGIOPLASTY WITH STENT PLACEMENT  03/24/2014   "1"   CORONARY ARTERY BYPASS GRAFT  01/09/1995   "CABG X3"   FLEXIBLE BRONCHOSCOPY N/A 03/01/2017   Procedure: FLEXIBLE BRONCHOSCOPY WITH BIOPSIES;  Surgeon: Gaye Pollack, MD;  Location: [place] OR;  Service: Thoracic;  Laterality: N/A;   JOINT REPLACEMENT     LAPAROSCOPIC CHOLECYSTECTOMY  12/2009   LEFT AND RIGHT HEART CATHETERIZATION WITH CORONARY/GRAFT ANGIOGRAM N/A 03/18/2014   Procedure: LEFT AND RIGHT HEART CATHETERIZATION WITH Beatrix Fetters;  Surgeon: Blane Ohara, MD;  Location:  [place] CATH LAB;  Service: Cardiovascular;  Laterality: N/A;   PERCUTANEOUS CORONARY STENT INTERVENTION (PCI-S)  03/18/2014   Procedure: PERCUTANEOUS CORONARY STENT INTERVENTION (PCI-S);  Surgeon: Blane Ohara, MD;  Location: [facility] CATH LAB;  Service: Cardiovascular;;   PERCUTANEOUS CORONARY STENT INTERVENTION (PCI-S) N/A 03/24/2014   Procedure: PERCUTANEOUS CORONARY STENT INTERVENTION (PCI-S);  Surgeon: Blane Ohara, MD;  Location: [facility] CATH LAB;  Service: Cardiovascular;  Laterality: N/A;   PLEURAL EFFUSION DRAINAGE Right 03/01/2017   Procedure: DRAINAGE OF PLEURAL EFFUSION;  Surgeon: Gaye Pollack, MD;  Location: [place] OR;  Service: Thoracic;  Laterality: Right;   RIGHT/LEFT HEART CATH AND CORONARY/GRAFT ANGIOGRAPHY N/A 05/15/2017   Procedure: RIGHT/LEFT HEART CATH AND CORONARY/GRAFT ANGIOGRAPHY;  Surgeon: Larey Dresser, MD;  Location: [place] CV LAB;  Service: Cardiovascular;  Laterality: N/A;   TALC PLEURODESIS Right 03/01/2017   Procedure: Pietro Cassis;  Surgeon: Gaye Pollack, MD;  Location: [place];  Service: Thoracic;   Laterality: Right;   TOTAL HIP ARTHROPLASTY Left 01/21/2013   Procedure: TOTAL HIP ARTHROPLASTY ANTERIOR APPROACH;  Surgeon: Mauri Pole, MD;  Location: [place];  Service: Orthopedics;  Laterality: Left;   VIDEO ASSISTED THORACOSCOPY Right 03/01/2017   Procedure: VIDEO ASSISTED THORACOSCOPY WITH BIOPSIES;  Surgeon: Gaye Pollack, MD;  Location: [place];  Service: Thoracic;  Laterality: Right;   VIDEO BRONCHOSCOPY N/A 11/17/2015   Procedure: VIDEO BRONCHOSCOPY WITH FLUORO;  Surgeon: Rigoberto Noel, MD;  Location: [place];  Service: Cardiopulmonary;  Laterality: N/A;   Family History  Problem Relation Age of Onset   Heart disease Father    Cancer Father        Lung   Arthritis Mother    Parkinsonism Mother    Arthritis Sister        Brother with rheumatoid arthritis   Hypertension Brother    Colon cancer Neg Hx    Colon polyps Neg Hx    Social History   Socioeconomic History   Marital status: Married    Spouse name: Ann   Number of children: 1   Years of education: Not on file   Highest education level: Not on file  Occupational History   Occupation: Retired    Comment: [employer]  Tobacco Use   Smoking status: Former    Packs/day: 1.50    Years: 30.00    Pack years: 45.00    Types: Cigarettes    Start date: 12/01/1956    Quit date: 01/08/1995    Years since quitting: 26.6   Smokeless tobacco: Never  Vaping Use   Vaping Use: Never used  Substance and Sexual Activity   Alcohol use: No    Alcohol/week: 0.0 standard drinks    Comment: 03/18/2014 "no alacohol since 1996"   Drug use: No   Sexual activity: Never  Other Topics Concern   Not on file  Social History Narrative   Married since 09/18/1964.    1 son Legrand Como. No grandchildren.   Social Determinants of Health   Financial Resource Strain: Low Risk    Difficulty of Paying Living Expenses: Not hard at all  Food Insecurity: No Food Insecurity   Worried About Charity fundraiser in the Last Year: Never  true   Dasher in the Last Year: Never true  Transportation Needs: No Transportation Needs   Lack of Transportation (Medical): No   Lack of Transportation (Non-Medical): No  Physical Activity: Insufficiently Active   Days of Exercise per Week:  3 days   Minutes of Exercise per Session: 20 min  Stress: No Stress Concern Present   Feeling of Stress : Not at all  Social Connections: Socially Integrated   Frequency of Communication with Friends and Family: More than three times a week   Frequency of Social Gatherings with Friends and Family: Three times a week   Attends Religious Services: More than 4 times per year   Active Member of Clubs or Organizations: Yes   Attends Music therapist: More than 4 times per year   Marital Status: Married    Tobacco Counseling Counseling given: Not Answered   Clinical Intake:  Pre-visit preparation completed: Yes  Pain : No/denies pain     BMI - recorded: 26.26 Nutritional Status: BMI 25 -29 Overweight Nutritional Risks: None Diabetes: No  How often do you need to have someone help you when you read instructions, pamphlets, or other written materials from your doctor or pharmacy?: 1 - Never  Diabetic?No  Interpreter Needed?: No  Information entered by :: [place] of Daily Living In your present state of health, do you have any difficulty performing the following activities: 08/08/2021 12/24/2020  Hearing? N N  Vision? N N  Difficulty concentrating or making decisions? N N  Walking or climbing stairs? Y Y  Dressing or bathing? N N  Doing errands, shopping? N N  Preparing Food and eating ? N -  Using the Toilet? N -  In the past six months, have you accidently leaked urine? N -  Do you have problems with loss of bowel control? N -  Managing your Medications? N -  Managing your Finances? N -  Housekeeping or managing your Housekeeping? N -  Some recent data might be hidden    Patient Care  Team: Coral Spikes, DO as PCP - General (Family Medicine) Larey Dresser, MD as PCP - Advanced Heart Failure (Cardiology) Gala Romney Cristopher Estimable, MD (Gastroenterology) Mal Misty, MD (Inactive) as Attending Physician (Vascular Surgery) Josue Hector, MD as Consulting Physician (Cardiology)  Indicate any recent Medical Services you may have received from other than [facility] providers in the past year (date may be approximate).     Assessment:   This is a routine wellness examination for [place].  Hearing/Vision screen Hearing Screening - Comments:: No hearing issues.  Vision Screening - Comments:: Readers. Dr. Katy Fitch. 2022.  Dietary issues and exercise activities discussed: Current Exercise Habits: Home exercise routine, Type of exercise: walking, Time (Minutes): 20, Frequency (Times/Week): 3, Weekly Exercise (Minutes/Week): 60, Intensity: Mild, Exercise limited by: cardiac condition(s);respiratory conditions(s)   Goals Addressed             This Visit's Progress    Have 3 meals a day       Eat a healthy diet. "Keep staying healthy"       Depression Screen PHQ 2/9 Scores 08/08/2021 01/05/2021 03/24/2020 06/06/2017 08/08/2016 03/08/2016 01/20/2016  PHQ - 2 Score 0 0 0 0 1 0 0    Fall Risk Fall Risk  08/08/2021 01/05/2021 03/24/2020 06/06/2017 08/08/2016  Falls in the past year? 0 0 0 No No  Number falls in past yr: 0 0 0 - -  Injury with Fall? 0 0 0 - -  Risk for fall due to : Impaired balance/gait - - - -  Follow up Falls prevention discussed Falls evaluation completed Falls evaluation completed - -    FALL RISK PREVENTION PERTAINING TO THE HOME:  Any stairs in or around the home? No  If so, are there any without handrails? No  Home free of loose throw rugs in walkways, pet beds, electrical cords, etc? Yes  Adequate lighting in your home to reduce risk of falls? Yes   ASSISTIVE DEVICES UTILIZED TO PREVENT FALLS:  Life alert? No  Use of a cane, walker or w/c? No  Grab  bars in the bathroom? Yes  Shower chair or bench in shower? No  Elevated toilet seat or a handicapped toilet? Yes   TIMED UP AND GO:  Was the test performed? Yes .  Phone visit.  Cognitive Function:     6CIT Screen 08/08/2021  What Year? 0 points  What month? 0 points  What time? 0 points  Count back from 20 0 points  Months in reverse 2 points  Repeat phrase 0 points  Total Score 2    Immunizations Immunization History  Administered Date(s) Administered   Fluad Quad(high Dose 65+) 05/19/2019   H1N1 06/27/2008, 08/06/2008   Influenza Split 06/24/2013   Influenza, High Dose Seasonal PF 06/05/2021   Influenza,inj,Quad PF,6+ Mos 06/15/2014, 05/31/2015, 05/09/2016, 06/06/2017, 06/10/2018   Influenza-Unspecified 04/28/2003, 04/27/2004, 04/27/2006, 05/28/2007, 05/27/2008, 05/27/2009, 04/27/2010, 06/01/2011, 05/27/2012, 07/04/2012, 06/03/2013, 07/01/2014, 05/22/2015, 07/06/2016, 04/27/2017, 05/30/2020   Moderna Sars-Covid-2 Vaccination 09/11/2019, 10/06/2019, 06/21/2020   Pneumococcal Conjugate-13 07/01/2014, 08/18/2014   Pneumococcal Polysaccharide-23 03/27/2012   Pneumococcal-Unspecified 05/27/2005   Tdap 07/05/2011   Zoster, Live 07/27/2008    TDAP status: Due, Education has been provided regarding the importance of this vaccine. Advised may receive this vaccine at local pharmacy or Health Dept. Aware to provide a copy of the vaccination record if obtained from local pharmacy or Health Dept. Verbalized acceptance and understanding.  Flu Vaccine status: Up to date  Pneumococcal vaccine status: Up to date  Covid-19 vaccine status: Completed vaccines  Qualifies for Shingles Vaccine? Yes   Zostavax completed Yes   Shingrix Completed?: No.    Education has been provided regarding the importance of this vaccine. Patient has been advised to call insurance company to determine out of pocket expense if they have not yet received this vaccine. Advised may also receive vaccine at  local pharmacy or Health Dept. Verbalized acceptance and understanding.  Screening Tests Health Maintenance  Topic Date Due   Zoster Vaccines- Shingrix (1 of 2) Never done   COVID-19 Vaccine (4 - Booster for Moderna series) 08/16/2020   TETANUS/TDAP  07/04/2021   Pneumonia Vaccine 46+ Years old  Completed   INFLUENZA VACCINE  Completed   HPV VACCINES  Aged Out    Health Maintenance  Health Maintenance Due  Topic Date Due   Zoster Vaccines- Shingrix (1 of 2) Never done   COVID-19 Vaccine (4 - Booster for Moderna series) 08/16/2020   TETANUS/TDAP  07/04/2021    Colorectal cancer screening: No longer required.   Lung Cancer Screening: (Low Dose CT Chest recommended if Age 56-80 years, 30 pack-year currently smoking OR have quit w/in 15years.) does not qualify.  Quit in 1996  Additional Screening:  Hepatitis C Screening: does not qualify;  Vision Screening: Recommended annual ophthalmology exams for early detection of glaucoma and other disorders of the eye. Is the patient up to date with their annual eye exam?  Yes  Who is the provider or what is the name of the office in which the patient attends annual eye exams? Dr. Katy Fitch If pt is not established with a provider, would they like to be referred to a provider  to establish care? No .   Dental Screening: Recommended annual dental exams for proper oral hygiene  Community Resource Referral / Chronic Care Management: CRR required this visit?  No   CCM required this visit?  No      Plan:     I have personally reviewed and noted the following in the patients chart:   Medical and social history Use of alcohol, tobacco or illicit drugs  Current medications and supplements including opioid prescriptions. Patient is not currently taking opioid prescriptions. Functional ability and status Nutritional status Physical activity Advanced directives List of other physicians Hospitalizations, surgeries, and ER visits in  previous 12 months Vitals Screenings to include cognitive, depression, and falls Referrals and appointments  In addition, I have reviewed and discussed with patient certain preventive protocols, quality metrics, and best practice recommendations. A written personalized care plan for preventive services as well as general preventive health recommendations were provided to patient.     Chriss Driver, LPN   12/45/8099   Nurse Notes: PHONE VISIT. PT AT HOME. NURSE AT [facility]. Pt states he is doing well. Up to date on health maintenance. Discussed Shingles and Tdap vaccines and how to obtain. 6CIT score of 2.

## 2021-08-15 ENCOUNTER — Ambulatory Visit (HOSPITAL_COMMUNITY)
Admission: RE | Admit: 2021-08-15 | Discharge: 2021-08-15 | Disposition: A | Discharge status: Home / Self Care | Payer: Medicare Other | Source: Ambulatory Visit | Attending: Internal Medicine | Admitting: Internal Medicine

## 2021-08-15 ENCOUNTER — Other Ambulatory Visit: Payer: Self-pay

## 2021-08-15 DIAGNOSIS — I5042 Chronic combined systolic (congestive) and diastolic (congestive) heart failure: Secondary | ICD-10-CM | POA: Insufficient documentation

## 2021-08-15 LAB — BASIC METABOLIC PANEL
Anion gap: 5 (ref 5–15)
BUN: 26 mg/dL — ABNORMAL HIGH (ref 8–23)
CO2: 29 mmol/L (ref 22–32)
Calcium: 8.3 mg/dL — ABNORMAL LOW (ref 8.9–10.3)
Chloride: 102 mmol/L (ref 98–111)
Creatinine, Ser: 2.08 mg/dL — ABNORMAL HIGH (ref 0.61–1.24)
GFR, Estimated: 31 mL/min — ABNORMAL LOW (ref >60)
Glucose, Bld: 93 mg/dL (ref 70–99)
Potassium: 4.8 mmol/L (ref 3.5–5.1)
Sodium: 136 mmol/L (ref 135–145)

## 2021-08-16 ENCOUNTER — Telehealth (HOSPITAL_COMMUNITY): Payer: Self-pay

## 2021-08-16 DIAGNOSIS — I5042 Chronic combined systolic (congestive) and diastolic (congestive) heart failure: Secondary | ICD-10-CM

## 2021-08-16 NOTE — Telephone Encounter (Addendum)
Pt aware, agreeable, and verbalized understanding Lab for 08/31/2021 @10 .45am ----- Message from Larey Dresser, MD sent at 08/15/2021 11:09 PM EST ----- Decrease Lasix back to 20 mg daily with BMET in 10 days.

## 2021-08-31 ENCOUNTER — Other Ambulatory Visit: Payer: Self-pay

## 2021-08-31 ENCOUNTER — Telehealth: Payer: Self-pay | Admitting: Pulmonary Disease

## 2021-08-31 ENCOUNTER — Ambulatory Visit (HOSPITAL_COMMUNITY)
Admission: RE | Admit: 2021-08-31 | Discharge: 2021-08-31 | Disposition: A | Discharge status: Home / Self Care | Payer: Medicare Other | Source: Ambulatory Visit | Attending: Cardiology | Admitting: Cardiology

## 2021-08-31 ENCOUNTER — Other Ambulatory Visit: Payer: Self-pay | Admitting: Pulmonary Disease

## 2021-08-31 DIAGNOSIS — I5042 Chronic combined systolic (congestive) and diastolic (congestive) heart failure: Secondary | ICD-10-CM | POA: Insufficient documentation

## 2021-08-31 LAB — BASIC METABOLIC PANEL
Anion gap: 6 (ref 5–15)
BUN: 25 mg/dL — ABNORMAL HIGH (ref 8–23)
CO2: 27 mmol/L (ref 22–32)
Calcium: 8.6 mg/dL — ABNORMAL LOW (ref 8.9–10.3)
Chloride: 103 mmol/L (ref 98–111)
Creatinine, Ser: 1.81 mg/dL — ABNORMAL HIGH (ref 0.61–1.24)
GFR, Estimated: 37 mL/min — ABNORMAL LOW (ref >60)
Glucose, Bld: 95 mg/dL (ref 70–99)
Potassium: 4.7 mmol/L (ref 3.5–5.1)
Sodium: 136 mmol/L (ref 135–145)

## 2021-08-31 MED ORDER — DEXAMETHASONE 0.5 MG/5ML PO SOLN: 5.0000 mL | Freq: Two times a day (BID) | ORAL | 0 refills | Status: DC

## 2021-08-31 MED ORDER — NYSTATIN 100000 UNIT/ML MT SUSP: 5.0000 mL | Freq: Two times a day (BID) | OROMUCOSAL | 0 refills | Status: AC

## 2021-08-31 NOTE — Telephone Encounter (Signed)
Pt takes NYSTAIN and is out of refills. States he has spots on his tongue again and would like somehting to help it. Pharmacy is [facility].    Dr. Elsworth Soho please advise.

## 2021-08-31 NOTE — Telephone Encounter (Signed)
Called and LVM for patient letting him know med was refilled and sent to [facility],

## 2021-09-15 ENCOUNTER — Ambulatory Visit (HOSPITAL_COMMUNITY)
Admission: RE | Admit: 2021-09-15 | Discharge: 2021-09-15 | Disposition: A | Discharge status: Home / Self Care | Payer: Medicare Other | Source: Ambulatory Visit | Attending: Internal Medicine | Admitting: Internal Medicine

## 2021-09-15 ENCOUNTER — Inpatient Hospital Stay: Payer: Medicare Other | Attending: Internal Medicine

## 2021-09-15 ENCOUNTER — Other Ambulatory Visit: Payer: Self-pay

## 2021-09-15 DIAGNOSIS — J90 Pleural effusion, not elsewhere classified: Secondary | ICD-10-CM | POA: Insufficient documentation

## 2021-09-15 DIAGNOSIS — K219 Gastro-esophageal reflux disease without esophagitis: Secondary | ICD-10-CM | POA: Insufficient documentation

## 2021-09-15 DIAGNOSIS — I700 Atherosclerosis of aorta: Secondary | ICD-10-CM | POA: Insufficient documentation

## 2021-09-15 DIAGNOSIS — C3490 Malignant neoplasm of unspecified part of unspecified bronchus or lung: Secondary | ICD-10-CM

## 2021-09-15 DIAGNOSIS — R5383 Other fatigue: Secondary | ICD-10-CM | POA: Insufficient documentation

## 2021-09-15 DIAGNOSIS — N183 Chronic kidney disease, stage 3 unspecified: Secondary | ICD-10-CM | POA: Insufficient documentation

## 2021-09-15 DIAGNOSIS — Z8719 Personal history of other diseases of the digestive system: Secondary | ICD-10-CM | POA: Insufficient documentation

## 2021-09-15 DIAGNOSIS — I130 Hypertensive heart and chronic kidney disease with heart failure and stage 1 through stage 4 chronic kidney disease, or unspecified chronic kidney disease: Secondary | ICD-10-CM | POA: Insufficient documentation

## 2021-09-15 DIAGNOSIS — C3411 Malignant neoplasm of upper lobe, right bronchus or lung: Secondary | ICD-10-CM | POA: Insufficient documentation

## 2021-09-15 DIAGNOSIS — E785 Hyperlipidemia, unspecified: Secondary | ICD-10-CM | POA: Insufficient documentation

## 2021-09-15 DIAGNOSIS — I2510 Atherosclerotic heart disease of native coronary artery without angina pectoris: Secondary | ICD-10-CM | POA: Insufficient documentation

## 2021-09-15 DIAGNOSIS — J449 Chronic obstructive pulmonary disease, unspecified: Secondary | ICD-10-CM | POA: Insufficient documentation

## 2021-09-15 DIAGNOSIS — I509 Heart failure, unspecified: Secondary | ICD-10-CM | POA: Insufficient documentation

## 2021-09-15 DIAGNOSIS — Z923 Personal history of irradiation: Secondary | ICD-10-CM | POA: Insufficient documentation

## 2021-09-15 DIAGNOSIS — R0609 Other forms of dyspnea: Secondary | ICD-10-CM | POA: Insufficient documentation

## 2021-09-15 DIAGNOSIS — I252 Old myocardial infarction: Secondary | ICD-10-CM | POA: Insufficient documentation

## 2021-09-15 DIAGNOSIS — K449 Diaphragmatic hernia without obstruction or gangrene: Secondary | ICD-10-CM | POA: Insufficient documentation

## 2021-09-15 DIAGNOSIS — Z79899 Other long term (current) drug therapy: Secondary | ICD-10-CM | POA: Insufficient documentation

## 2021-09-15 DIAGNOSIS — Z9049 Acquired absence of other specified parts of digestive tract: Secondary | ICD-10-CM | POA: Insufficient documentation

## 2021-09-15 DIAGNOSIS — Z9221 Personal history of antineoplastic chemotherapy: Secondary | ICD-10-CM | POA: Insufficient documentation

## 2021-09-15 DIAGNOSIS — I255 Ischemic cardiomyopathy: Secondary | ICD-10-CM | POA: Insufficient documentation

## 2021-09-15 LAB — CBC WITH DIFFERENTIAL (CANCER CENTER ONLY)
Abs Immature Granulocytes: 0.02 10*3/uL (ref 0.00–0.07)
Basophils Absolute: 0 10*3/uL (ref 0.0–0.1)
Basophils Relative: 0 %
Eosinophils Absolute: 0.2 10*3/uL (ref 0.0–0.5)
Eosinophils Relative: 2 %
HCT: 35.2 % — ABNORMAL LOW (ref 39.0–52.0)
Hemoglobin: 11 g/dL — ABNORMAL LOW (ref 13.0–17.0)
Immature Granulocytes: 0 %
Lymphocytes Relative: 16 %
Lymphs Abs: 1.3 10*3/uL (ref 0.7–4.0)
MCH: 27.4 pg (ref 26.0–34.0)
MCHC: 31.3 g/dL (ref 30.0–36.0)
MCV: 87.8 fL (ref 80.0–100.0)
Monocytes Absolute: 0.7 10*3/uL (ref 0.1–1.0)
Monocytes Relative: 9 %
Neutro Abs: 6.1 10*3/uL (ref 1.7–7.7)
Neutrophils Relative %: 73 %
Platelet Count: 120 10*3/uL — ABNORMAL LOW (ref 150–400)
RBC: 4.01 MIL/uL — ABNORMAL LOW (ref 4.22–5.81)
RDW: 15.9 % — ABNORMAL HIGH (ref 11.5–15.5)
WBC Count: 8.2 10*3/uL (ref 4.0–10.5)
nRBC: 0 % (ref 0.0–0.2)

## 2021-09-15 LAB — CMP (CANCER CENTER ONLY)
ALT: 9 U/L (ref 0–44)
AST: 15 U/L (ref 15–41)
Albumin: 3.8 g/dL (ref 3.5–5.0)
Alkaline Phosphatase: 69 U/L (ref 38–126)
Anion gap: 5 (ref 5–15)
BUN: 24 mg/dL — ABNORMAL HIGH (ref 8–23)
CO2: 29 mmol/L (ref 22–32)
Calcium: 8.9 mg/dL (ref 8.9–10.3)
Chloride: 103 mmol/L (ref 98–111)
Creatinine: 1.92 mg/dL — ABNORMAL HIGH (ref 0.61–1.24)
GFR, Estimated: 35 mL/min — ABNORMAL LOW (ref >60)
Glucose, Bld: 95 mg/dL (ref 70–99)
Potassium: 4.8 mmol/L (ref 3.5–5.1)
Sodium: 137 mmol/L (ref 135–145)
Total Bilirubin: 0.4 mg/dL (ref 0.3–1.2)
Total Protein: 6.9 g/dL (ref 6.5–8.1)

## 2021-09-18 ENCOUNTER — Other Ambulatory Visit: Payer: Self-pay

## 2021-09-18 ENCOUNTER — Inpatient Hospital Stay (HOSPITAL_BASED_OUTPATIENT_CLINIC_OR_DEPARTMENT_OTHER): Payer: Medicare Other | Admitting: Internal Medicine

## 2021-09-18 VITALS — BP 127/64 | HR 83 | Temp 97.0°F | Resp 18 | Ht 70.0 in | Wt 179.0 lb

## 2021-09-18 DIAGNOSIS — C3411 Malignant neoplasm of upper lobe, right bronchus or lung: Secondary | ICD-10-CM

## 2021-09-18 DIAGNOSIS — K219 Gastro-esophageal reflux disease without esophagitis: Secondary | ICD-10-CM | POA: Diagnosis not present

## 2021-09-18 DIAGNOSIS — Z9221 Personal history of antineoplastic chemotherapy: Secondary | ICD-10-CM | POA: Diagnosis not present

## 2021-09-18 DIAGNOSIS — E785 Hyperlipidemia, unspecified: Secondary | ICD-10-CM | POA: Diagnosis not present

## 2021-09-18 DIAGNOSIS — I509 Heart failure, unspecified: Secondary | ICD-10-CM | POA: Diagnosis not present

## 2021-09-18 DIAGNOSIS — C3490 Malignant neoplasm of unspecified part of unspecified bronchus or lung: Secondary | ICD-10-CM

## 2021-09-18 DIAGNOSIS — Z79899 Other long term (current) drug therapy: Secondary | ICD-10-CM | POA: Diagnosis not present

## 2021-09-18 DIAGNOSIS — K449 Diaphragmatic hernia without obstruction or gangrene: Secondary | ICD-10-CM | POA: Diagnosis not present

## 2021-09-18 DIAGNOSIS — I700 Atherosclerosis of aorta: Secondary | ICD-10-CM | POA: Diagnosis not present

## 2021-09-18 DIAGNOSIS — Z8719 Personal history of other diseases of the digestive system: Secondary | ICD-10-CM | POA: Diagnosis not present

## 2021-09-18 DIAGNOSIS — I130 Hypertensive heart and chronic kidney disease with heart failure and stage 1 through stage 4 chronic kidney disease, or unspecified chronic kidney disease: Secondary | ICD-10-CM | POA: Diagnosis not present

## 2021-09-18 DIAGNOSIS — I2510 Atherosclerotic heart disease of native coronary artery without angina pectoris: Secondary | ICD-10-CM | POA: Diagnosis not present

## 2021-09-18 DIAGNOSIS — J449 Chronic obstructive pulmonary disease, unspecified: Secondary | ICD-10-CM | POA: Diagnosis not present

## 2021-09-18 DIAGNOSIS — I255 Ischemic cardiomyopathy: Secondary | ICD-10-CM | POA: Diagnosis not present

## 2021-09-18 DIAGNOSIS — R5383 Other fatigue: Secondary | ICD-10-CM | POA: Diagnosis not present

## 2021-09-18 DIAGNOSIS — Z923 Personal history of irradiation: Secondary | ICD-10-CM | POA: Diagnosis not present

## 2021-09-18 DIAGNOSIS — I252 Old myocardial infarction: Secondary | ICD-10-CM | POA: Diagnosis not present

## 2021-09-18 DIAGNOSIS — R0609 Other forms of dyspnea: Secondary | ICD-10-CM | POA: Diagnosis not present

## 2021-09-18 DIAGNOSIS — J90 Pleural effusion, not elsewhere classified: Secondary | ICD-10-CM | POA: Diagnosis not present

## 2021-09-18 DIAGNOSIS — Z9049 Acquired absence of other specified parts of digestive tract: Secondary | ICD-10-CM | POA: Diagnosis not present

## 2021-09-18 DIAGNOSIS — N183 Chronic kidney disease, stage 3 unspecified: Secondary | ICD-10-CM | POA: Diagnosis not present

## 2021-09-18 MED ORDER — AZITHROMYCIN 250 MG PO TABS: ORAL_TABLET | ORAL | 0 refills | Status: DC

## 2021-09-18 NOTE — Progress Notes (Signed)
[place] [phone]   [phone]  OFFICE PROGRESS NOTE  Coral Spikes, DO [place] [address]  DIAGNOSIS: Stage IIIA (T2b, N2, M0) non-small cell lung cancer, favoring squamous cell carcinoma presented with right upper lobe lung mass in addition to mediastinal lymphadenopathy diagnosed in March 2017.  PRIOR THERAPY:  A course of concurrent chemoradiation with weekly carboplatin for AUC of 2 and paclitaxel 45 MG/M2. Status post 5 cycle with partial response.  CURRENT THERAPY: Observation  INTERVAL HISTORY: Paul Sandoval. 81 y.o. male returns to the clinic today for 61-monthfollow-up visit.  The patient is feeling fine today with no concerning complaints except for the mild cough and shortness of breath with exertion but no significant chest pain or hemoptysis.  He denied having any nausea, vomiting, diarrhea or constipation.  He denied having any weight loss or night sweats.  He has no headache or visual changes.  He has been on observation since completion of his concurrent chemoradiation.  The patient is here today for evaluation and repeat CT scan of the chest for restaging of his disease.  MEDICAL HISTORY: Past Medical History:  Diagnosis Date   AAA (abdominal aortic aneurysm) 2010   4.4 cm 08/2008;4.44 in 7/10 and 4.65 in 08/2009; 4.8 by CT in 11/2009; 4.3 by ultrasound in 08/2010   Anemia    Arteriosclerotic cardiovascular disease (ASCVD) 1996   CABG-1996   Arthritis    "fingers" (03/18/2014)   CAD (coronary artery disease)    03/18/14:  PCI with DES to distal left main. 7/29: DES to the SVG to Diag   Can[facility] ([facility]    Upper right lobe lung cancer   Cardiomyopathy, ischemic    Echo 03/17/14: EF 45-50%   CHF (congestive heart failure) (HCC)    Chronic bronchitis (HCC)    Chronic kidney disease    CRF   Chronic rhinitis    Colonic polyp 2002   polypectomy in 2002   COPD (ch[facility] obstructive pulmonary disease)  [place]    Diverticulosis    Dyspnea    with exertion   ED (erectile dysfunction)    Encounter for antineoplastic chemotherapy 12/19/2015   GERD (gastroesophageal reflux disease)    History of blood transfusion    Hyperlipidemia    Hypertension    IFG (impaired fasting glucose)    Myocardial infarction ([facility]    "told h/o silent MI sometime before 1996"   Pneumonia ~ 2001; ~ 2005    has had more than twice   Right bundle branch block    Tobacco abuse, in remission    40 pack year total consumption; discontinued in 1996    ALLERGIES:  is allergic to neomycin and cetirizine & related.  MEDICATIONS:  Current Outpatient Medications  Medication Sig Dispense Refill   acetaminophen (TYLENOL) 500 MG tablet Take 1,000 mg by mouth every 6 (six) hours as needed for fever.     albuterol (VENTOLIN HFA) 108 (90 Base) MCG/ACT inhaler INHALE 2 PUFFS BY MOUTH EVERY 4 TO 6 HOURS AS NEEDED FOR WHEEZING. 8.5 g 5   aspirin EC 81 MG tablet Take 81 mg by mouth daily before breakfast.      carvedilol (COREG) 12.5 MG tablet Take 1.5 tablets (18.75 mg total) by mouth 2 (two) times daily with a meal. 180 tablet 3   clopidogrel (PLAVIX) 75 MG tablet TAKE (1) TABLET BY MOUTH ONCE DAILY. 90 tablet 0  dapagliflozin propanediol (FARXIGA) 10 MG TABS tablet Take 10 mg by mouth daily before breakfast. 30 tablet 11   eplerenone (INSPRA) 50 MG tablet Take 1 tablet (50 mg total) by mouth every evening. 30 tablet 11   fluticasone (FLONASE) 50 MCG/ACT nasal spray Place 1 spray into both nostrils daily as needed for allergies or rhinitis.     Fluticasone-Salmeterol (ADVAIR) 100-50 MCG/DOSE AEPB Inhale 1 puff into the lungs 2 (two) times daily.     furosemide (LASIX) 20 MG tablet Take 21m (2 tablets) by mouth daily alternating with 243m(1 tablet) by mouth daily. 45 tablet 11   guaiFENesin-dextromethorphan (ROBITUSSIN DM) 100-10 MG/5ML syrup Take 200 mLs by mouth every 4 (four) hours as needed for cough.      ipratropium-albuterol (DUONEB) 0.5-2.5 (3) MG/3ML SOLN Take 3 mLs by nebulization in the morning, at noon, and at bedtime.     Multiple Vitamins-Minerals (CENTRUM SILVER ADULT 50+) TABS Take 1 tablet by mouth daily with lunch.      nitroGLYCERIN (NITROSTAT) 0.4 MG SL tablet PLACE 1 TAB UNDER TONGUE EVERY 5 MIN IF NEEDED FOR CHEST PAIN. MAY USE 3 TIMES.NO RELIEF CALL 911. 25 tablet 1   omeprazole (PRILOSEC) 20 MG capsule Take 1 capsule (20 mg total) by mouth daily. 90 capsule 0   rosuvastatin (CRESTOR) 20 MG tablet TAKE ONE TABLET BY MOUTH ONCE DAILY. 90 tablet 3   sacubitril-valsartan (ENTRESTO) 97-103 MG Take 0.5 tablets by mouth 2 (two) times daily.     nystatin (MYCOSTATIN) 100000 UNIT/ML suspension Take 5 mLs (500,000 Units total) by mouth in the morning and at bedtime. swish and swallow twice daily (Patient not taking: Reported on 09/18/2021) 100 mL 0   sodium chloride (OCEAN) 0.65 % SOLN nasal spray Place 1 spray into both nostrils as needed for congestion. (Patient not taking: Reported on 09/18/2021)     No current facility-administered medications for this visit.    SURGICAL HISTORY:  Past Surgical History:  Procedure Laterality Date   ABDOMINAL AORTIC ANEURYSM REPAIR  11/2012   ABDOMINAL AORTIC ENDOVASCULAR STENT GRAFT N/A 12/11/2012   Procedure: ABDOMINAL AORTIC ENDOVASCULAR STENT GRAFT;  Surgeon: JaMal MistyMD;  [facility] Service[facility];  Laterality: N/A;  Ultrasound guided; GoParker5/14/1996   COLONOSCOPY  2002   polypectomy-patient denies   CORONARY ANGIOPLASTY WITH STENT PLACEMENT  03/18/2014   "1"   CORONARY ANGIOPLASTY WITH STENT PLACEMENT  03/24/2014   "1"   CORONARY ARTERY BYPASS GRAFT  01/09/1995   "CABG X3"   FLEXIBLE BRONCHOSCOPY N/A 03/01/2017   Procedure: FLEXIBLE BRONCHOSCOPY WITH BIOPSIES;  Surgeon: BaGaye PollackMD;  [facility];  Service: Thoracic;  Laterality: N/A;   JOINT REPLACEMENT     LAPAROSCOPIC CHOLECYSTECTOMY   12/2009   LEFT AND RIGHT HEART CATHETERIZATION WITH CORONARY/GRAFT ANGIOGRAM N/A 03/18/2014   Procedure: LEFT AND RIGHT HEART CATHETERIZATION WITH COBeatrix Fetters Surgeon: MiBlane OharaMD;  [facility];  Service: Cardiovascular;  Laterality: N/A;   PERCUTANEOUS CORONARY STENT INTERVENTION (PCI-S)  03/18/2014   Procedure: PERCUTANEOUS CORONARY STENT INTERVENTION (PCI-S);  Surgeon: MiBlane OharaMD;  [facility];  Service: Cardiovascular;;   PERCUTANEOUS CORONARY STENT INTERVENTION (PCI-S) N/A 03/24/2014   Procedure: PERCUTANEOUS CORONARY STENT INTERVENTION (PCI-S);  Surgeon: MiBlane OharaMD;  [facility];  Service: Cardiovascular;  Laterality: N/A;   PLEURAL EFFUSION DRAINAGE Right 03/01/2017   Procedure: DRAINAGE OF PLEURAL EFFUSION;  Surgeon:  Gaye Pollack, MD;  Location[facility] OR;  Service: Thoracic;  Laterality: Right;   RIGHT/LEFT HEART CATH AND CORONARY/GRAFT ANGIOGRAPHY N/A 05/15/2017   Procedure: RIGHT/LEFT HEART CATH AND CORONARY/GRAFT ANGIOGRAPHY;  Surgeon: Larey Dresser, MD;  Location: [place] CV LAB;  Service: Cardiovascular;  Laterality: N/A;   TALC PLEURODESIS Right 03/01/2017   Procedure: Pietro Cassis;  Surgeon: Gaye Pollack, MD;  Location: [place];  Service: Thoracic;  Laterality: Right;   TOTAL HIP ARTHROPLASTY Left 01/21/2013   Procedure: TOTAL HIP ARTHROPLASTY ANTERIOR APPROACH;  Surgeon: Mauri Pole, MD;  Location: [place];  Service: Orthopedics;  Laterality: Left;   VIDEO ASSISTED THORACOSCOPY Right 03/01/2017   Procedure: VIDEO ASSISTED THORACOSCOPY WITH BIOPSIES;  Surgeon: Gaye Pollack, MD;  Location: [place];  Service: Thoracic;  Laterality: Right;   VIDEO BRONCHOSCOPY N/A 11/17/2015   Procedure: VIDEO BRONCHOSCOPY WITH FLUORO;  Surgeon: Rigoberto Noel, MD;  Location: [place];  Service: Cardiopulmonary;  Laterality: N/A;    REVIEW OF SYSTEMS:  A comprehensive review of systems was negative except for:  Constitutional: positive for fatigue Respiratory: positive for cough and dyspnea on exertion   PHYSICAL EXAMINATION: General appearance: alert, cooperative, fatigued and no distress Head: Normocephalic, without obvious abnormality, atraumatic Neck: no adenopathy, no JVD, supple, symmetrical, trachea midline and thyroid not enlarged, symmetric, no tenderness/mass/nodules Lymph nodes: Cervical, supraclavicular, and axillary nodes normal. Resp: clear to auscultation bilaterally Back: symmetric, no curvature. ROM normal. No CVA tenderness. Cardio: regular rate and rhythm, S1, S2 normal, no murmur, click, rub or gallop GI: soft, non-tender; bowel sounds normal; no masses,  no organomegaly Extremities: extremities normal, atraumatic, no cyanosis or edema Neurologic: Alert and oriented X 3, normal strength and tone. Normal symmetric reflexes. Normal coordination and gait  ECOG PERFORMANCE STATUS: 1 - Symptomatic but completely ambulatory  Blood pressure 127/64, pulse 83, temperature (!) 97 F (36.1 C), temperature source Tympanic, resp. rate 18, height _0  (1.778 m), weight 179 lb (81.2 kg), SpO2 94 %.  LABORATORY DATA: Lab Results  Component Value Date   WBC 8.2 09/15/2021   HGB 11.0 (L) 09/15/2021   HCT 35.2 (L) 09/15/2021   MCV 87.8 09/15/2021   PLT 120 (L) 09/15/2021      Chemistry      Component Value Date/Time   NA 137 09/15/2021 1015   NA 140 05/23/2020 0821   NA 137 05/21/2017 0906   K 4.8 09/15/2021 1015   K 4.1 05/21/2017 0906   CL 103 09/15/2021 1015   CO2 29 09/15/2021 1015   CO2 29 05/21/2017 0906   BUN 24 (H) 09/15/2021 1015   BUN 28 (H) 05/23/2020 0821   BUN 14.6 05/21/2017 0906   CREATININE 1.92 (H) 09/15/2021 1015   CREATININE 1.2 05/21/2017 0906      Component Value Date/Time   CALCIUM 8.9 09/15/2021 1015   CALCIUM 9.4 05/21/2017 0906   ALKPHOS 69 09/15/2021 1015   ALKPHOS 86 05/21/2017 0906   AST 15 09/15/2021 1015   AST 19 05/21/2017 0906   ALT 9  09/15/2021 1015   ALT 14 05/21/2017 0906   BILITOT 0.4 09/15/2021 1015   BILITOT 0.57 05/21/2017 0906       RADIOGRAPHIC STUDIES: CT Chest Wo Contrast  Result Date: 09/16/2021 CLINICAL DATA:  81 year old male with history of non-small cell lung cancer. Status post chemotherapy and radiation therapy now complete. Shortness of breath with exertion. EXAM: CT CHEST WITHOUT CONTRAST TECHNIQUE: Multidetector CT imaging of the chest was  performed following the standard protocol without IV contrast. RADIATION DOSE REDUCTION: This exam was performed according to the departmental dose-optimization program which includes automated exposure control, adjustment of the mA and/or kV according to patient size and/or use of iterative reconstruction technique. COMPARISON:  Chest CT 03/13/2021. FINDINGS: Cardiovascular: Heart size is normal. There is no significant pericardial fluid, thickening or pericardial calcification. There is aortic atherosclerosis, as well as atherosclerosis of the great vessels of the mediastinum and the coronary arteries, including calcified atherosclerotic plaque in the left main, left anterior descending, left circumflex and right coronary arteries. Status post median sternotomy for CABG including LIMA to the LAD. Calcifications of the aortic valve. Mediastinum/Nodes: No pathologically enlarged mediastinal or hilar lymph nodes. Please note that accurate exclusion of hilar adenopathy is limited on noncontrast CT scans. Small hiatal hernia. No axillary lymphadenopathy. Lungs/Pleura: Chronic mass-like architectural distortion in the upper right lung, similar to prior examinations, most compatible with chronic postradiation mass-like fibrosis. Similar findings are present to a lesser extent in the medial aspect of the left upper lobe. Thick-walled partially calcified moderate right pleural effusion again noted, stable. Scattered areas of septal thickening are noted throughout the lungs bilaterally.  There is a new area of ill-defined ground-glass attenuation in the left upper lobe, best appreciated on axial images 22-47 of series 5. No other definite suspicious appearing pulmonary nodules or masses are noted. Upper Abdomen: Aortic atherosclerosis. Incompletely imaged stent graft in the abdominal aorta. Status post cholecystectomy. Musculoskeletal: There are no aggressive appearing lytic or blastic lesions noted in the visualized portions of the skeleton. Median sternotomy wires. Chronic compression fracture of T4 with 40% loss of anterior vertebral body height. IMPRESSION: 1. New ill-defined area of ground-glass attenuation in the left upper lobe. This is nonspecific and could be infectious or inflammatory in etiology, however, close attention on follow-up studies is recommended. 2. 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 3. Aortic atherosclerosis, in addition to left main and three-vessel coronary artery disease. Status post median sternotomy for CABG including LIMA to the LAD. 4. There are calcifications of the aortic valve. Echocardiographic correlation for evaluation of potential valvular dysfunction may be warranted if clinically indicated. Aortic Atherosclerosis (ICD10-I70.0). Electronically Signed   By: Vinnie Langton M.D.   On: 09/16/2021 07:42     ASSESSMENT AND PLAN:  This is a very pleasant 81 years old white male with a stage IIIA non-small cell lung cancer status post a course of concurrent chemoradiation with weekly carboplatin and paclitaxel and he had a rough time with the treatment at that time. He did not receive consolidation chemotherapy. The patient is currently on observation and he is feeling fine today with no concerning complaints except for the mild cough and shortness of breath with exertion. He had repeat CT scan of the chest performed recently.  I personally and  independently reviewed the scan images and discussed the results with the patient and showed him the images. His scan showed new ill-defined areas of groundglass attenuation in the left upper lobe likely inflammatory in origin but attention on follow-up studies is recommended. I recommended for the patient to continue on observation with repeat CT scan of the chest without contrast in 6 months. For the suspicious inflammatory process in the left upper lobe, I will start the patient on Z-Pak and if no improvement in his symptoms he will reach out to his primary care physician or pulmonologist for evaluation.  The patient was advised to call immediately if he has any other concerning symptoms in the interval. The patient voices understanding of current disease status and treatment options and is in agreement with the current care plan. All questions were answered. The patient knows to call the clinic with any problems, questions or concerns. We can certainly see the patient much sooner if necessary.   Disclaimer: This note was dictated with voice recognition software. Similar sounding words can inadvertently be transcribed and may not be corrected upon review.

## 2021-09-29 ENCOUNTER — Emergency Department (HOSPITAL_COMMUNITY): Payer: No Typology Code available for payment source

## 2021-09-29 ENCOUNTER — Emergency Department (HOSPITAL_COMMUNITY)
Admission: EM | Admit: 2021-09-29 | Discharge: 2021-09-29 | Disposition: A | Discharge status: Home / Self Care | Payer: No Typology Code available for payment source | Attending: Emergency Medicine | Admitting: Emergency Medicine

## 2021-09-29 ENCOUNTER — Other Ambulatory Visit: Payer: Self-pay

## 2021-09-29 ENCOUNTER — Encounter (HOSPITAL_COMMUNITY): Payer: Self-pay

## 2021-09-29 DIAGNOSIS — Z79899 Other long term (current) drug therapy: Secondary | ICD-10-CM | POA: Insufficient documentation

## 2021-09-29 DIAGNOSIS — Z7982 Long term (current) use of aspirin: Secondary | ICD-10-CM | POA: Insufficient documentation

## 2021-09-29 DIAGNOSIS — Z7951 Long term (current) use of inhaled steroids: Secondary | ICD-10-CM | POA: Diagnosis not present

## 2021-09-29 DIAGNOSIS — J441 Chronic obstructive pulmonary disease with (acute) exacerbation: Secondary | ICD-10-CM | POA: Diagnosis not present

## 2021-09-29 DIAGNOSIS — R0602 Shortness of breath: Secondary | ICD-10-CM | POA: Diagnosis present

## 2021-09-29 DIAGNOSIS — Z20822 Contact with and (suspected) exposure to covid-19: Secondary | ICD-10-CM | POA: Insufficient documentation

## 2021-09-29 DIAGNOSIS — Z7902 Long term (current) use of antithrombotics/antiplatelets: Secondary | ICD-10-CM | POA: Diagnosis not present

## 2021-09-29 DIAGNOSIS — Z85118 Personal history of other malignant neoplasm of bronchus and lung: Secondary | ICD-10-CM | POA: Diagnosis not present

## 2021-09-29 DIAGNOSIS — I509 Heart failure, unspecified: Secondary | ICD-10-CM | POA: Insufficient documentation

## 2021-09-29 LAB — CBC
HCT: 36.1 % — ABNORMAL LOW (ref 39.0–52.0)
Hemoglobin: 11.4 g/dL — ABNORMAL LOW (ref 13.0–17.0)
MCH: 27.6 pg (ref 26.0–34.0)
MCHC: 31.6 g/dL (ref 30.0–36.0)
MCV: 87.4 fL (ref 80.0–100.0)
Platelets: 135 10*3/uL — ABNORMAL LOW (ref 150–400)
RBC: 4.13 MIL/uL — ABNORMAL LOW (ref 4.22–5.81)
RDW: 15.8 % — ABNORMAL HIGH (ref 11.5–15.5)
WBC: 15.3 10*3/uL — ABNORMAL HIGH (ref 4.0–10.5)
nRBC: 0 % (ref 0.0–0.2)

## 2021-09-29 LAB — BASIC METABOLIC PANEL
Anion gap: 7 (ref 5–15)
BUN: 28 mg/dL — ABNORMAL HIGH (ref 8–23)
CO2: 27 mmol/L (ref 22–32)
Calcium: 8.8 mg/dL — ABNORMAL LOW (ref 8.9–10.3)
Chloride: 101 mmol/L (ref 98–111)
Creatinine, Ser: 1.69 mg/dL — ABNORMAL HIGH (ref 0.61–1.24)
GFR, Estimated: 40 mL/min — ABNORMAL LOW (ref >60)
Glucose, Bld: 113 mg/dL — ABNORMAL HIGH (ref 70–99)
Potassium: 4.7 mmol/L (ref 3.5–5.1)
Sodium: 135 mmol/L (ref 135–145)

## 2021-09-29 LAB — RESP PANEL BY RT-PCR (FLU A&B, COVID) ARPGX2
Influenza A by PCR: NEGATIVE
Influenza B by PCR: NEGATIVE
SARS Coronavirus 2 by RT PCR: NEGATIVE

## 2021-09-29 MED ORDER — DOXYCYCLINE HYCLATE 100 MG PO CAPS: 100.0000 mg | ORAL_CAPSULE | Freq: Two times a day (BID) | ORAL | 0 refills | Status: DC

## 2021-09-29 NOTE — ED Notes (Signed)
Pt ambulatory to and from CT.

## 2021-09-29 NOTE — ED Notes (Signed)
X 1 blue save tube sent to lab.

## 2021-09-29 NOTE — ED Triage Notes (Signed)
Patient arrives from home with complaint of non productive cough and SOB x 1 day. Pt reports difficulty breathing when laying down to sleep. Hx COPD

## 2021-09-29 NOTE — ED Provider Notes (Signed)
[facility] DEPT Provider Note   CSN: [id number] Arrival date & time: 09/29/21  0636     History  Chief Complaint  Patient presents with   Shortness of Breath   Cough    Paul Hao. is a 81 y.o. male.  He has a history of non-small cell lung cancer.  Also has COPD and CHF.  Complaining of cough productive of some yellow sputum increased shortness of breath that has been going on for the last few days.  Having trouble sleeping at night.  No fevers or chills.  No nausea diarrhea or constipation.  Had 1 episode of vomiting which he thinks was related to cough.  The history is provided by the patient.  Shortness of Breath Severity:  Moderate Onset quality:  Gradual Duration:  3 days Timing:  Intermittent Progression:  Unchanged Chronicity:  Recurrent Relieved by:  Nothing Worsened by:  Activity and coughing Ineffective treatments:  None tried Associated symptoms: cough, sputum production and vomiting   Associated symptoms: no abdominal pain, no chest pain, no diaphoresis, no fever, no headaches, no rash and no sore throat   Cough Associated symptoms: shortness of breath   Associated symptoms: no chest pain, no diaphoresis, no fever, no headaches, no rash and no sore throat       Home Medications Prior to Admission medications   Medication Sig Start Date End Date Taking? Authorizing Provider  acetaminophen (TYLENOL) 500 MG tablet Take 1,000 mg by mouth every 6 (six) hours as needed for fever.    [provider]  albuterol (VENTOLIN HFA) 108 (90 Base) MCG/ACT inhaler INHALE 2 PUFFS BY MOUTH EVERY 4 TO 6 HOURS AS NEEDED FOR WHEEZING. 01/25/21   Lovena Le, Malena M, DO  aspirin EC 81 MG tablet Take 81 mg by mouth daily before breakfast.     [provider]  azithromycin (ZITHROMAX) 250 MG tablet Use as instructed 09/18/21   Curt Bears, MD  carvedilol (COREG) 12.5 MG tablet Take 1.5 tablets (18.75 mg total) by mouth 2 (two)  times daily with a meal. 02/14/21   Larey Dresser, MD  clopidogrel (PLAVIX) 75 MG tablet TAKE (1) TABLET BY MOUTH ONCE DAILY. 07/24/21   Larey Dresser, MD  dapagliflozin propanediol (FARXIGA) 10 MG TABS tablet Take 10 mg by mouth daily before breakfast. 08/27/19   Larey Dresser, MD  eplerenone (INSPRA) 50 MG tablet Take 1 tablet (50 mg total) by mouth every evening. 04/05/20   Larey Dresser, MD  fluticasone [facility]) 50 MCG/ACT nasal spray Place 1 spray into both nostrils daily as needed for allergies or rhinitis.    [provider]  Fluticasone-Salmeterol (ADVAIR) 100-50 MCG/DOSE AEPB Inhale 1 puff into the lungs 2 (two) times daily.    [provider]  furosemide (LASIX) 20 MG tablet Take 40mg  (2 tablets) by mouth daily alternating with 20mg  (1 tablet) by mouth daily. 08/03/21   Larey Dresser, MD  guaiFENesin-dextromethorphan [facility] DM) 100-10 MG/5ML syrup Take 200 mLs by mouth every 4 (four) hours as needed for cough.    [provider]  ipratropium-albuterol (DUONEB) 0.5-2.5 (3) MG/3ML SOLN Take 3 mLs by nebulization in the morning, at noon, and at bedtime.    [provider]  Multiple Vitamins-Minerals (CENTRUM SILVER ADULT 50+) TABS Take 1 tablet by mouth daily with lunch.     [provider]  nitroGLYCERIN (NITROSTAT) 0.4 MG SL tablet PLACE 1 TAB UNDER TONGUE EVERY 5 MIN IF NEEDED FOR  CHEST PAIN. MAY USE 3 TIMES.NO RELIEF CALL 911. 07/11/18   Larey Dresser, MD  nystatin (MYCOSTATIN) 100000 UNIT/ML suspension Take 5 mLs (500,000 Units total) by mouth in the morning and at bedtime. swish and swallow twice daily Patient not taking: Reported on 09/18/2021 08/31/21 09/30/21  Rigoberto Noel, MD  omeprazole (PRILOSEC) 20 MG capsule Take 1 capsule (20 mg total) by mouth daily. 08/07/21   Coral Spikes, DO  rosuvastatin (CRESTOR) 20 MG tablet TAKE ONE TABLET BY MOUTH ONCE DAILY. 12/21/20   Larey Dresser, MD  sacubitril-valsartan (ENTRESTO)  97-103 MG Take 0.5 tablets by mouth 2 (two) times daily. 07/13/20   [provider]  sodium chloride (OCEAN) 0.65 % SOLN nasal spray Place 1 spray into both nostrils as needed for congestion. Patient not taking: Reported on 09/18/2021    [provider]      Allergies    Neomycin and Cetirizine & related    Review of Systems   Review of Systems  Constitutional:  Negative for diaphoresis and fever.  HENT:  Negative for sore throat.   Eyes:  Negative for visual disturbance.  Respiratory:  Positive for cough, sputum production and shortness of breath.   Cardiovascular:  Negative for chest pain.  Gastrointestinal:  Positive for vomiting. Negative for abdominal pain.  Genitourinary:  Negative for dysuria.  Musculoskeletal:  Negative for gait problem.  Skin:  Negative for rash.  Neurological:  Negative for headaches.   Physical Exam Updated Vital Signs BP 123/63    Pulse 94    Temp 98.2 F (36.8 C) (Oral)    Resp (!) 23    Ht 5\' 10"  (1.778 m)    Wt 78.9 kg    SpO2 95%    BMI 24.97 kg/m  Physical Exam Vitals and nursing note reviewed.  Constitutional:      General: He is not in acute distress.    Appearance: He is well-developed.  HENT:     Head: Normocephalic and atraumatic.  Eyes:     Conjunctiva/sclera: Conjunctivae normal.  Cardiovascular:     Rate and Rhythm: Normal rate and regular rhythm.     Heart sounds: No murmur heard. Pulmonary:     Effort: Pulmonary effort is normal. No respiratory distress.     Breath sounds: Normal breath sounds.  Abdominal:     Palpations: Abdomen is soft.     Tenderness: There is no abdominal tenderness.  Musculoskeletal:        General: No swelling. Normal range of motion.     Cervical back: Neck supple.     Right lower leg: No tenderness. No edema.     Left lower leg: No tenderness. No edema.  Skin:    General: Skin is warm and dry.     Capillary Refill: Capillary refill takes less than 2 seconds.  Neurological:      General: No focal deficit present.     Mental Status: He is alert.    ED Results / Procedures / Treatments   Labs (all labs ordered are listed, but only abnormal results are displayed) Labs Reviewed  CBC - Abnormal; Notable for the following components:      Result Value   WBC 15.3 (*)    RBC 4.13 (*)    Hemoglobin 11.4 (*)    HCT 36.1 (*)    RDW 15.8 (*)    Platelets 135 (*)    All other components within normal limits  BASIC METABOLIC PANEL - Abnormal;  Notable for the following components:   Glucose, Bld 113 (*)    BUN 28 (*)    Creatinine, Ser 1.69 (*)    Calcium 8.8 (*)    GFR, Estimated 40 (*)    All other components within normal limits  RESP PANEL BY RT-PCR (FLU A&B, COVID) [id number]    EKG EKG Interpretation  Date/Time:  Friday September 29 2021 06:42:05 EST Ventricular Rate:  99 PR Interval:  179 QRS Duration: 151 QT Interval:  371 QTC Calculation: 477 R Axis:   115 Text Interpretation: Sinus rhythm RBBB and LPFB Baseline wander in lead(s) V3 No significant change since prior 11/22 Confirmed by Aletta Edouard [phone]) on 09/29/2021 7:09:01 AM  Radiology DG Chest 2 View  Result Date: 09/29/2021 CLINICAL DATA:  Increased cough and shortness of breath. History of non-small-cell lung cancer. EXAM: CHEST - 2 VIEW COMPARISON:  Chest CT 09/15/2021.  Chest x-ray 07/01/2021 FINDINGS: Similar appearance of volume loss in the right hemithorax with right apical pleuroparenchymal and right parahilar opacity. Similar appearance of the right basilar disease and small right pleural effusion. Left lung remains clear. Patient is status post CABG. Telemetry leads overlie the chest. IMPRESSION: Extensive pleuroparenchymal disease in the right lung in this patient with a reported history of non-small-cell lung cancer. Overall imaging features are stable comparing to chest x-ray of 07/01/2021. Electronically Signed   By: Misty Stanley M.D.   On: 09/29/2021 07:17   CT Chest Wo  Contrast  Result Date: 09/29/2021 CLINICAL DATA:  Respiratory illness, shortness of breath, productive cough difficulty breathing. EXAM: CT CHEST WITHOUT CONTRAST TECHNIQUE: Multidetector CT imaging of the chest was performed following the standard protocol without IV contrast. RADIATION DOSE REDUCTION: This exam was performed according to the departmental dose-optimization program which includes automated exposure control, adjustment of the mA and/or kV according to patient size and/or use of iterative reconstruction technique. COMPARISON:  Chest radiograph performed earlier on the same date. CT chest dated March 13, 2021 FINDINGS: Cardiovascular: The heart is normal in size. Prominent coronary artery atherosclerotic calcifications with evidence of prior coronary artery bypass grafting. No pericardial effusion. Atherosclerotic calcification of aorta and branch vessels. Mediastinum/Nodes: No enlarged mediastinal or axillary lymph nodes. Evaluation of hilar lymph nodes is limited due to lack of intravenous contrast. Thyroid gland, trachea, and esophagus demonstrate no significant findings. Lungs/Pleura: Right upper lobe pleural/parenchymal scarring and/or consolidation with architectural distortion is unchanged, likely sequela of prior radiotherapy. Multiple area of atelectasis in the right lower and middle lobes as well as calcified loculated right pleural effusion or unchanged. Left upper lobe ground-glass opacities with interlobular septal thickening is also not significantly changed. Mild left basilar atelectasis. No pneumothorax. Upper Abdomen: No acute abnormality. 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 Musculoskeletal: No acute osseous abnormality. Degenerate disc disease of the thoracic spine. Sternotomy wires are intact. IMPRESSION: 1. Right upper lobe consolidation and fibrotic changes as well as loculated thick walled pleural effusion in the base of the right lung are unchanged.  Multiple areas of atelectasis in the right lung are also unchanged. 2. Ground-glass opacities with interlobular septal thickening in the left upper lobe and minimal right basilar atelectasis are also unchanged. No definite evidence of acute pneumonia. No pneumothorax. 3. Atherosclerotic calcification of coronary arteries, evidence of prior CABG as well as atherosclerotic disease of aorta and branch vessels. 4.  No acute upper abdominal process. Aortic Atherosclerosis (ICD10-I70.0). Electronically Signed   By: Keane Police D.O.   On: 09/29/2021 09:49  Procedures Procedures    Medications Ordered in ED Medications - No data to display  ED Course/ Medical Decision Making/ A&P Clinical Course as of 09/29/21 1812  Fri Sep 29, 2021  0719 Chest x-ray with infiltrate versus mass right side similar to prior chest x-ray.  Awaiting radiology reading. [MB]  0165 CT does not show any acute changes.  Attempted to reach Dr. Julien Nordmann but did not receive a call back.  Patient is comfortable plan for another course of antibiotics and outpatient follow-up with his treating provider. [MB]    Clinical Course User Index [MB] Hayden Rasmussen, MD                           Medical Decision Making Amount and/or Complexity of Data Reviewed Labs: ordered. Radiology: ordered.  Risk Prescription drug management.  Paul Minney. was evaluated in Emergency Department on 09/29/2021 for the symptoms described in the history of present illness. He was evaluated in the context of the global COVID-19 pandemic, which necessitated consideration that the patient might be at risk for infection with the SARS-CoV-2 virus that causes COVID-19. Institutional protocols and algorithms that pertain to the evaluation of patients at risk for COVID-19 are in a state of rapid change based on information released by regulatory bodies including the CDC and federal and state organizations. These policies and algorithms were followed  during the patient's care in the ED.  This patient complains of cough with some sputum shortness of breath; this involves an extensive number of treatment Options and is a complaint that carries with it a high risk of complications and Morbidity. The differential includes COVID, flu, CHF, COPD, pneumonia, pneumothorax  I ordered, reviewed and interpreted labs, which included CBC with elevated white count, hemoglobin low stable from priors chemistries with elevated creatinine stable from priors, COVID and flu negative I ordered imaging studies which included chest x-ray and CT noncontrast and I independently    visualized and interpreted imaging which showed no significant change from prior work-up Additional history obtained from patient's wife Previous records obtained and reviewed in epic including recent oncology note 2 weeks ago  After the interventions stated above, I reevaluated the patient and found patient to be hemodynamically stable satting well on room air.  Reviewed work-up with him.  He is agreeable to trial of antibiotic as outpatient.  Recommended close follow-up with his treating providers.  Return instructions discussed          Final Clinical Impression(s) / ED Diagnoses Final diagnoses:  COPD exacerbation ([place])    Rx / DC Orders ED Discharge Orders          Ordered    doxycycline (VIBRAMYCIN) 100 MG capsule  2 times daily        09/29/21 1030              Hayden Rasmussen, MD 09/29/21 1815

## 2021-09-29 NOTE — Discharge Instructions (Addendum)
You were seen in the emergency department for cough and shortness of breath.  You had blood work EKG and chest x-ray along with a CAT scan of your chest.  We are prescribing you a course of antibiotics.  Please keep in contact with your primary care doctor and your oncologist.  Return to the emergency department if any worsening or concerning symptoms

## 2021-10-05 ENCOUNTER — Encounter: Payer: Self-pay | Admitting: Pulmonary Disease

## 2021-10-05 ENCOUNTER — Ambulatory Visit (INDEPENDENT_AMBULATORY_CARE_PROVIDER_SITE_OTHER): Payer: Medicare Other | Admitting: Pulmonary Disease

## 2021-10-05 ENCOUNTER — Other Ambulatory Visit: Payer: Self-pay

## 2021-10-05 DIAGNOSIS — I5042 Chronic combined systolic (congestive) and diastolic (congestive) heart failure: Secondary | ICD-10-CM | POA: Diagnosis not present

## 2021-10-05 DIAGNOSIS — J449 Chronic obstructive pulmonary disease, unspecified: Secondary | ICD-10-CM

## 2021-10-05 NOTE — Progress Notes (Signed)
° °  Subjective:    Patient ID: Paul Sandoval., male    DOB: [birth year], 81 y.o.   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  ED visit for copd exacerbation 09/29/2021   Oncology consultation with Dr. Julien Nordmann on 1/23 was reviewed, he had green sputum production and was given a Z-Pak.  CT chest showed faint groundglass infiltrate in the left upper lobe and chronic changes of radiation fibrosis in the right upper lung and stable right small pleural effusion  On 2/3 he had an ED visit and a repeat CT which really showed no significant changes, he was given a course of doxycycline for 10 days. He states that breathing is stable.  He is maintained on Advair and uses DuoNebs at least thrice daily.  He is also on Entresto, denies pedal edema, orthopnea or paroxysmal nocturnal dyspnea   Significant tests/ events reviewed  02/2021 CT chest postirradiation appearance with dense fibrotic consolidation and volume loss right upper lobe, loculated right small effusion with pleural  calcification Enlarged subcarinal lymph node stable for more than 2 years     04/09/2018-echocardiogram-LV ejection fraction 25 to 30%, diffuse hypokinesis,   02/01/2016-pulmonary function test- FVC 1.96 (45% predicted), postbronchodilator ratio of 67, postbronchodilator FEV1 1.64 (53% predicted, postbronchodilator response, significant mid flow reversibility, DLCO 58  Review of Systems neg for any significant sore throat, dysphagia, itching, sneezing, nasal congestion or excess/ purulent secretions, fever,  chills, sweats, unintended wt loss, pleuritic or exertional cp, hempoptysis, orthopnea pnd or change in chronic leg swelling. Also denies presyncope, palpitations, heartburn, abdominal pain, nausea, vomiting, diarrhea or change in bowel or urinary habits, dysuria,hematuria, rash, arthralgias, visual complaints, headache, numbness weakness or ataxia.     Objective:   Physical Exam  Gen. Pleasant, well-nourished, in no distress ENT - no thrush, no pallor/icterus,no post nasal drip Neck: No JVD, no thyromegaly, no carotid bruits Lungs: no use of accessory muscles, no dullness to percussion, Lt upper rales no rhonchi  Cardiovascular: Rhythm regular, heart sounds  normal, no murmurs or gallops, no peripheral edema Musculoskeletal: No deformities, no cyanosis or clubbing        Assessment & Plan:

## 2021-10-05 NOTE — Patient Instructions (Addendum)
°  X sputum culture   You had a COPD flare requiring an ED visit on 2/3 while on advair, hence we are changing to [employer] of Anoro

## 2021-10-05 NOTE — Assessment & Plan Note (Addendum)
He has received 2 rounds of antibiotics and had an ED visit.  Since this occurred on Advair, he should ideally be escalated to triple therapy but this may be too expensive for him. I would suggest that he get on LABA/LAMA combination at least or what ever he can get from the [place].  I provided him with a sample of Anoro today  Since he had Serratia isolated in the past, I have asked him to obtain another sputum culture to see if he needs more antibiotic.  He does not seem to have bronchospasm today and does not need steroids

## 2021-10-05 NOTE — Assessment & Plan Note (Signed)
Continue usual dose of Lasix, does not appear to be in fluid overload

## 2021-10-06 NOTE — Addendum Note (Signed)
Addended by: Fritzi Mandes D on: 10/06/2021 10:21 AM   Modules accepted: Orders

## 2021-10-09 ENCOUNTER — Telehealth: Payer: Self-pay | Admitting: Pulmonary Disease

## 2021-10-09 ENCOUNTER — Other Ambulatory Visit: Payer: Self-pay

## 2021-10-09 ENCOUNTER — Other Ambulatory Visit (HOSPITAL_COMMUNITY): Payer: Self-pay | Admitting: Cardiology

## 2021-10-09 MED ORDER — ANORO ELLIPTA 62.5-25 MCG/ACT IN AEPB: 1.0000 | INHALATION_SPRAY | Freq: Every day | RESPIRATORY_TRACT | 11 refills | Status: DC

## 2021-10-09 NOTE — Telephone Encounter (Signed)
Routing to [place] office and Dr. Elsworth Soho since Dr. Elsworth Soho is in [place] today.

## 2021-10-09 NOTE — Telephone Encounter (Signed)
Called and spoke to patient and he states he already discussed with his [place] provider today and they agree on Anoro inhaler. Would like to know if we can send since [facility].

## 2021-10-09 NOTE — Telephone Encounter (Signed)
Called and left message on home phone letting patient know anoro was sent to [employer]. Nothing further needed.

## 2021-10-10 ENCOUNTER — Other Ambulatory Visit: Payer: Self-pay

## 2021-10-10 MED ORDER — ANORO ELLIPTA 62.5-25 MCG/ACT IN AEPB: 1.0000 | INHALATION_SPRAY | Freq: Every day | RESPIRATORY_TRACT | 11 refills | Status: DC

## 2021-10-10 NOTE — Telephone Encounter (Signed)
Prescription printed and faxed to [place] office for Dr. Elsworth Soho to sign.

## 2021-10-10 NOTE — Telephone Encounter (Signed)
Patient is requesting we sign a paper prescription and fax to the  [place].

## 2021-10-12 NOTE — Telephone Encounter (Signed)
Received signed rx from Dr. Elsworth Soho. Rx and ov notes from 10/05/21 faxed to [facility] as requested per patient

## 2021-10-14 LAB — GRAM STAIN W/SPUTUM CULT RFLX

## 2021-10-14 LAB — SPUTUM CULTURE

## 2021-10-17 ENCOUNTER — Other Ambulatory Visit: Payer: Self-pay

## 2021-10-17 MED ORDER — SULFAMETHOXAZOLE-TRIMETHOPRIM 800-160 MG PO TABS: 1.0000 | ORAL_TABLET | Freq: Two times a day (BID) | ORAL | 0 refills | Status: AC

## 2021-10-18 ENCOUNTER — Telehealth: Payer: Self-pay | Admitting: Pulmonary Disease

## 2021-10-18 NOTE — Telephone Encounter (Signed)
Pt called requesting call back regarding message.  [phone]

## 2021-10-18 NOTE — Telephone Encounter (Signed)
Called and spoke to patient. [facility] states [facility]'t [facility] and to try to refax to [phone]     Rx refaxed.

## 2021-10-23 ENCOUNTER — Other Ambulatory Visit (HOSPITAL_COMMUNITY): Payer: Self-pay | Admitting: Cardiology

## 2021-10-23 DIAGNOSIS — C3411 Malignant neoplasm of upper lobe, right bronchus or lung: Secondary | ICD-10-CM

## 2021-10-30 ENCOUNTER — Encounter: Payer: Self-pay | Admitting: Pulmonary Disease

## 2021-10-30 ENCOUNTER — Ambulatory Visit (INDEPENDENT_AMBULATORY_CARE_PROVIDER_SITE_OTHER): Payer: Medicare Other | Admitting: Pulmonary Disease

## 2021-10-30 ENCOUNTER — Other Ambulatory Visit: Payer: Self-pay

## 2021-10-30 DIAGNOSIS — J449 Chronic obstructive pulmonary disease, unspecified: Secondary | ICD-10-CM

## 2021-10-30 DIAGNOSIS — C3411 Malignant neoplasm of upper lobe, right bronchus or lung: Secondary | ICD-10-CM

## 2021-10-30 NOTE — Assessment & Plan Note (Signed)
Continue on Anoro.  I do not think that the rash is related to Anoro.  This has been present in the past even when he was on Wixela and Symbicort. ?Overall the new medication is working well for him. ?He will try to get this approved from the [place].  In case he is not able to then options include paying out-of-pocket or trying to get assistance from [place] ? ?He is now colonized with Pseudomonas and this appears to be quiesced sent for now and Bactrim treatment seems to have worked ?

## 2021-10-30 NOTE — Progress Notes (Signed)
? ?  Subjective:  ? ? Patient ID: Paul Sandoval., male    DOB: [birth year], 81 y.o.   MRN: [id number] ? ?HPI ? 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  ?PMH - CKD -3, ischemic cardiomyopathy - EF 30% ? s/p CABG in 1996 and PCI to dLM and SVG-D in 02/2014 ? ?Chief Complaint  ?Patient presents with  ? Acute Visit  ?  Developed rash on both arms, very itchy. He states his tongue is slightly sore, he rinses mouth after he uses inhaler. He believes symptoms are coming from taking Anoro.   ? ? ?Started on Anoro 2/9 on last OV since he had ED visit for COPD flare while on advair ?Sputum cx showed pseudomonas S to bactrim/ levaquin ?>> Rxed with bactrim x 7ds ?CT chest 09/2021 in the ED showed unchanged groundglass opacities in the left upper lobe ? ?He states that coughing is improved and has turned dry, no fevers.  He has developed a rash on his forearm and wrist which comes and goes, he has been applying hydrocortisone cream.  He reports this rash has been present in the past even when he was on other medications such as Wixela and Symbicort ? ?He also complains of a red sore on his tongue but this is not like the thrush that he used to get with Advair.  Complains of dryness.  He is trying to get Anoro approved by the [place] ? ?Significant tests/ events reviewed ? ?08/2021 CT chest wo con >> new area of groundglass left upper lobe ? ?02/2021 CT chest postirradiation appearance with dense fibrotic consolidation and volume loss right upper lobe, loculated right small effusion with pleural  calcification ?Enlarged subcarinal lymph node stable for more than 2 years ?  ?  ?04/09/2018-echocardiogram-LV ejection fraction 25 to 30%, diffuse hypokinesis, ?  ?02/01/2016-pulmonary function test- FVC 1.96 (45%  predicted), postbronchodilator ratio of 67, postbronchodilator FEV1 1.64 (53% predicted, postbronchodilator response, significant mid flow reversibility, DLCO 58 ? ?Review of Systems ?neg for any significant sore throat, dysphagia, itching, sneezing, nasal congestion or excess/ purulent secretions, fever, chills, sweats, unintended wt loss, pleuritic or exertional cp, hempoptysis, orthopnea pnd or change in chronic leg swelling. Also denies presyncope, palpitations, heartburn, abdominal pain, nausea, vomiting, diarrhea or change in bowel or urinary habits, dysuria,hematuria, rash, arthralgias, visual complaints, headache, numbness weakness or ataxia. ? ?   ?Objective:  ? Physical Exam ? ?Gen. Pleasant, elderly, well-nourished, in no distress, normal affect ?ENT - no pallor,icterus, no post nasal drip ?Neck: No JVD, no thyromegaly, no carotid bruits ?Lungs: no use of accessory muscles, no dullness to percussion, RT basal rales no rhonchi  ?Cardiovascular: Rhythm regular, heart sounds  normal, no murmurs or gallops, no peripheral edema ?Abdomen: soft and non-tender, no hepatosplenomegaly, BS normal. ?Musculoskeletal: No deformities, no cyanosis or clubbing ?Neuro:  alert, non focal ? ? ? ?   ?Assessment & Plan:  ? ? ?

## 2021-10-30 NOTE — Patient Instructions (Signed)
?  OK to stay on anoro ?OK to apply hydrocort cream for rash ? ?

## 2021-10-30 NOTE — Assessment & Plan Note (Signed)
Left upper lobe new groundglass opacities could have been related to Pseudomonas infection which has not been treated. ?We will await 41-month follow-up CT scan ?

## 2021-11-03 ENCOUNTER — Encounter (HOSPITAL_COMMUNITY): Payer: No Typology Code available for payment source | Admitting: Cardiology

## 2021-11-06 ENCOUNTER — Other Ambulatory Visit: Payer: Self-pay | Admitting: Family Medicine

## 2021-11-09 ENCOUNTER — Ambulatory Visit (HOSPITAL_COMMUNITY)
Admission: RE | Admit: 2021-11-09 | Discharge: 2021-11-09 | Disposition: A | Discharge status: Home / Self Care | Payer: Medicare Other | Source: Ambulatory Visit | Attending: Cardiology | Admitting: Cardiology

## 2021-11-09 ENCOUNTER — Encounter (HOSPITAL_COMMUNITY): Payer: Self-pay | Admitting: Cardiology

## 2021-11-09 ENCOUNTER — Other Ambulatory Visit: Payer: Self-pay

## 2021-11-09 VITALS — BP 112/60 | HR 76 | Wt 181.4 lb

## 2021-11-09 DIAGNOSIS — I5022 Chronic systolic (congestive) heart failure: Secondary | ICD-10-CM | POA: Insufficient documentation

## 2021-11-09 DIAGNOSIS — J449 Chronic obstructive pulmonary disease, unspecified: Secondary | ICD-10-CM | POA: Insufficient documentation

## 2021-11-09 DIAGNOSIS — I25810 Atherosclerosis of coronary artery bypass graft(s) without angina pectoris: Secondary | ICD-10-CM | POA: Insufficient documentation

## 2021-11-09 DIAGNOSIS — I130 Hypertensive heart and chronic kidney disease with heart failure and stage 1 through stage 4 chronic kidney disease, or unspecified chronic kidney disease: Secondary | ICD-10-CM | POA: Diagnosis not present

## 2021-11-09 DIAGNOSIS — E785 Hyperlipidemia, unspecified: Secondary | ICD-10-CM | POA: Insufficient documentation

## 2021-11-09 DIAGNOSIS — I6529 Occlusion and stenosis of unspecified carotid artery: Secondary | ICD-10-CM | POA: Diagnosis not present

## 2021-11-09 DIAGNOSIS — J90 Pleural effusion, not elsewhere classified: Secondary | ICD-10-CM | POA: Insufficient documentation

## 2021-11-09 DIAGNOSIS — I4510 Unspecified right bundle-branch block: Secondary | ICD-10-CM | POA: Diagnosis not present

## 2021-11-09 DIAGNOSIS — I255 Ischemic cardiomyopathy: Secondary | ICD-10-CM | POA: Diagnosis not present

## 2021-11-09 DIAGNOSIS — I5042 Chronic combined systolic (congestive) and diastolic (congestive) heart failure: Secondary | ICD-10-CM | POA: Diagnosis not present

## 2021-11-09 DIAGNOSIS — Z79899 Other long term (current) drug therapy: Secondary | ICD-10-CM | POA: Diagnosis not present

## 2021-11-09 DIAGNOSIS — N183 Chronic kidney disease, stage 3 unspecified: Secondary | ICD-10-CM | POA: Insufficient documentation

## 2021-11-09 DIAGNOSIS — I2510 Atherosclerotic heart disease of native coronary artery without angina pectoris: Secondary | ICD-10-CM | POA: Diagnosis not present

## 2021-11-09 DIAGNOSIS — Z7902 Long term (current) use of antithrombotics/antiplatelets: Secondary | ICD-10-CM | POA: Insufficient documentation

## 2021-11-09 DIAGNOSIS — C3490 Malignant neoplasm of unspecified part of unspecified bronchus or lung: Secondary | ICD-10-CM | POA: Insufficient documentation

## 2021-11-09 LAB — BASIC METABOLIC PANEL
Anion gap: 7 (ref 5–15)
BUN: 23 mg/dL (ref 8–23)
CO2: 28 mmol/L (ref 22–32)
Calcium: 8.9 mg/dL (ref 8.9–10.3)
Chloride: 102 mmol/L (ref 98–111)
Creatinine, Ser: 1.97 mg/dL — ABNORMAL HIGH (ref 0.61–1.24)
GFR, Estimated: 34 mL/min — ABNORMAL LOW (ref >60)
Glucose, Bld: 107 mg/dL — ABNORMAL HIGH (ref 70–99)
Potassium: 5.3 mmol/L — ABNORMAL HIGH (ref 3.5–5.1)
Sodium: 137 mmol/L (ref 135–145)

## 2021-11-09 LAB — LIPID PANEL
Cholesterol: 116 mg/dL (ref 0–200)
HDL: 36 mg/dL — ABNORMAL LOW (ref >40)
LDL Cholesterol: 60 mg/dL (ref 0–99)
Total CHOL/HDL Ratio: 3.2 RATIO
Triglycerides: 99 mg/dL (ref <150)
VLDL: 20 mg/dL (ref 0–40)

## 2021-11-09 NOTE — Progress Notes (Signed)
?PCP: Dr. Wolfgang Phoenix ?[facility] Cardiology: Dr. Aundra Dubin ? ?81 y.o. with history of COPD, CAD s/p CABG in 1996 and PCI to dLM and SVG-D in 7/15, ischemic cardiomyopathy, and non-small cell lung cancer presents for followup of CHF and dyspnea.  He was initially referred to [place] by Dr. Lenna Gilford after fall in EF and worsening dyspnea was noted.  ? ?Patient reports that his initial symptoms prior to CABG in 1996 were dyspnea.  He has never had any worrisome chest pain.  He did well initially after CABG and was golfing regularly and walking 2-3 miles/day up until around 3/17.  He had one episode of increased dyspnea in 2015 and had cath with PCI to distal LM and SVG-D.  In 3/17, he was diagnosed with NSCLC and was treated with radiation and chemotherapy.  Since then, he has had progressive exertional dyspnea.  He was noted on echo in 7/18 to have EF down to 20-25%.   ? ?RHC/LHC was done in 9/18, showing occluded SVG-small D, patent LIMA-LAD and SVG-PDA, 50% shelf-like stenosis distal left main (no intervention), filling pressure not elevated and cardiac output preserved.  ? ?Echo in 8/19 showed that EF remains 30-35%.  Echo in 2/21 showed EF 35%, mildly decreased RV systolic function.  ? ?Admitted 9/60/45 with A/C Systolic Heart Failure exacerbation. Diuresed with IV lasix and transitioned to Lasix 40 mg daily.  Echo in 5/22 showed stable EF 30-35%.  ? ?He had a COPD exacerbation in 11/22.   ? ?He returns today for followup of CHF.  He is in the "green zone" today. Has chronic dry cough.  He says that his breathing is stable as long as he follows his "limits."  He is able to go grocery shopping and make his bed but he cannot play golf due to dyspnea.  No chest pain.  No orthopnea/PND.  No wheezing. Weight stable.  ? ?Labs (9/17): LDL 90 ?Labs (7/18): K 3.5, creatinine 1.28, hgb 10.7 ?Labs (8/18): LDL 68 ?Labs (9/18): K 4.1, creatinine 1.2, hgb 11.1, plts 98 K ?Labs (10/18): K 4.1, creatinine 1.5 ?Labs (1/19): K 4.3, creatinine 1.54, LDL  80, HDL 49, LFTs normal, TSH normal ?Labs (4/19): K 4.3, creatinine 1.52, hgb 11.8 ?Labs (7/19): K 4.1, creatinine 1.5, LDL 75, LFTs normal, hgb 12.2 ?Labs (10/19): K 4.3, creatinine 1.53 ?Labs (12/19): K 4.1, creatinine 1.49 ?Labs (10/20): K 4.4, creatinine 1.67  ?Labs (1/21): LDL 66 ?Labs (12/20): K 4.1, creatinine 1.7 ?Labs (4/21): K 4.1, creatinine 1.71 ?Labs (5/21): K 4.5, creatinine 1.81 ?Labs (9/21): K 4.1, creatinine 1.88, LDL 59, HDL 41 ?Labs (4/22): K 4.4, creatinine 2.32 => 2.21, hgb 9.7 ?Labs (7/22): K 4.5, creatinine 1.76 ?Labs (11/22): K 4.4, creatinine 1.72, BNP 785, hgb 9.8, LDL 57, TGs 129 ?Labs (2/23): K 4.7, creatinine 1.69 ? ?PMH: ?1. COPD: PFTs (6/17) with severe obstructive airways disease.  ?2. AAA: s/p repair with stent graft.  ?3. Non-small cell lung cancer: Stage IIIA, diagnosed 3/17.  He had radiation as well as chemotherapy with carboplatin and paclitaxel.   ?4. CKD stage 3 ?5. PNA in 6/17 ?6. Recurrent right pleural effusion: VATS with talc pleurodesis on right.  No malignant cells noted.  ?7. HTN ?8. H/o THR ?9. CAD: CABG 1996 with SVG-D, SVG-PDA, LIMA-LAD.   ?- LHC (7/15): Totally occluded RCA and LAD.  Severe distal LM stenosis.  Patent SVG-PDA, patent LIMA-LAD.  Severe disease SVG-D.  Patient had DES to distal left main and staged PCI of SVG-D.   ?-  Cardiolite (7/17): EF 30%, prior infarction with no ischemia.  ?- LHC (9/18): occluded SVG-small D, patent LIMA-LAD and SVG-PDA, 50% shelf-like stenosis distal left main (no intervention).  ?10. Chronic systolic CHF: Ischemic cardiomyopathy.   ?- Echo (6/17): EF 30-35%. ?- Echo (7/18): EF 20-25%, moderate RV dilation with mildly decreased RV systolic function.  ?- RHC (9/18): mean RA 3, PA 28/9 mean 18, mean PCWP 10, CI 4.05.  ?- Echo (8/19): EF 30-35%, mildly decreased RV systolic function.   ?- Echo (2/21): EF 35%, mildly decreased RV systolic function ?- Echo (5/22): EF 30-35%  ?11. Carotid stenosis: Carotid dopplers (7/20) with 40-59%  BICA stenosis.  ?- Carotid dopplers (7/21): 40-59% BICA stenosis.  ?- Carotid dopplers (7/22): 40-59% BICA stenosis.  ? ?Social History  ? ?Socioeconomic History  ? Marital status: Married  ?  Spouse name: [name]  ? Number of children: 1  ? Years of education: Not on file  ? Highest education level: Not on file  ?Occupational History  ? Occupation: Retired  ?  Comment: [employer]  ?Tobacco Use  ? Smoking status: Former  ?  Packs/day: 1.50  ?  Years: 30.00  ?  Pack years: 45.00  ?  Types: Cigarettes  ?  Start date: 12/01/1956  ?  Quit date: 01/08/1995  ?  Years since quitting: 26.8  ? Smokeless tobacco: Never  ?Vaping Use  ? Vaping Use: Never used  ?Substance and Sexual Activity  ? Alcohol use: No  ?  Alcohol/week: 0.0 standard drinks  ?  Comment: 03/18/2014 "no alacohol since 1996"  ? Drug use: No  ? Sexual activity: Never  ?Other Topics Concern  ? Not on file  ?Social History Narrative  ? Married since 09/18/1964.   ? 1 son Legrand Como. No grandchildren.  ? ?Social Determinants of Health  ? ?Financial Resource Strain: Low Risk   ? Difficulty of Paying Living Expenses: Not hard at all  ?Food Insecurity: No Food Insecurity  ? Worried About Charity fundraiser in the Last Year: Never true  ? Ran Out of Food in the Last Year: Never true  ?Transportation Needs: No Transportation Needs  ? Lack of Transportation (Medical): No  ? Lack of Transportation (Non-Medical): No  ?Physical Activity: Insufficiently Active  ? Days of Exercise per Week: 3 days  ? Minutes of Exercise per Session: 20 min  ?Stress: No Stress Concern Present  ? Feeling of Stress : Not at all  ?Social Connections: Socially Integrated  ? Frequency of Communication with Friends and Family: More than three times a week  ? Frequency of Social Gatherings with Friends and Family: Three times a week  ? Attends Religious Services: More than 4 times per year  ? Active Member of Clubs or Organizations: Yes  ? Attends Archivist Meetings: More than 4 times  per year  ? Marital Status: Married  ?Intimate Partner Violence: Not At Risk  ? Fear of Current or Ex-Partner: No  ? Emotionally Abused: No  ? Physically Abused: No  ? Sexually Abused: No  ? ?Family History  ?Problem Relation Age of Onset  ? Heart disease Father   ? Cancer Father   ?     Lung  ? Arthritis Mother   ? Parkinsonism Mother   ? Arthritis Sister   ?     Brother with rheumatoid arthritis  ? Hypertension Brother   ? Colon cancer Neg Hx   ? Colon polyps Neg Hx   ? ?ROS: All  systems reviewed and negative except as per HPI.  ? ?Current Outpatient Medications  ?Medication Sig Dispense Refill  ? acetaminophen (TYLENOL) 500 MG tablet Take 1,000 mg by mouth every 6 (six) hours as needed for fever.    ? albuterol (VENTOLIN HFA) 108 (90 Base) MCG/ACT inhaler INHALE 2 PUFFS BY MOUTH EVERY 4 TO 6 HOURS AS NEEDED FOR WHEEZING. 8.5 g 5  ? carvedilol (COREG) 12.5 MG tablet TAKE 1 AND A HALF TABLET BY MOUTH TWICE DAILY WITH A MEAL 180 tablet 0  ? clopidogrel (PLAVIX) 75 MG tablet TAKE (1) TABLET BY MOUTH ONCE DAILY. 90 tablet 0  ? dapagliflozin propanediol (FARXIGA) 10 MG TABS tablet Take 10 mg by mouth daily before breakfast. 30 tablet 11  ? Dextromethorphan-guaiFENesin (ROBITUSSIN DM PO) Take 20 mLs by mouth as needed. For cough    ? eplerenone (INSPRA) 50 MG tablet Take 1 tablet (50 mg total) by mouth every evening. 30 tablet 11  ? fluticasone (FLONASE) 50 MCG/ACT nasal spray Place 1 spray into both nostrils daily as needed for allergies or rhinitis.    ? furosemide (LASIX) 20 MG tablet Take 20 mg by mouth daily.    ? ipratropium-albuterol (DUONEB) 0.5-2.5 (3) MG/3ML SOLN Take 3 mLs by nebulization in the morning, at noon, and at bedtime.    ? Multiple Vitamins-Minerals (CENTRUM SILVER ADULT 50+) TABS Take 1 tablet by mouth daily with lunch.     ? nitroGLYCERIN (NITROSTAT) 0.4 MG SL tablet PLACE 1 TAB UNDER TONGUE EVERY 5 MIN IF NEEDED FOR CHEST PAIN. MAY USE 3 TIMES.NO RELIEF CALL 911. 25 tablet 1  ? omeprazole  (PRILOSEC) 20 MG capsule TAKE ONE CAPSULE BY MOUTH DAILY. 90 capsule 0  ? rosuvastatin (CRESTOR) 20 MG tablet TAKE ONE TABLET BY MOUTH ONCE DAILY. 90 tablet 3  ? sacubitril-valsartan (ENTRESTO) 97-103 MG Ta

## 2021-11-09 NOTE — Patient Instructions (Signed)
Medication Changes: ? ?Stop Asprin  ? ? ?Lab Work: ? ?Labs done today, your results will be available in MyChart, we will contact you for abnormal readings. ? ? ?Testing/Procedures: ? ?Your physician has requested that you have an echocardiogram. Echocardiography is a painless test that uses sound waves to create images of your heart. It provides your doctor with information about the size and shape of your heart and how well your heart?s chambers and valves are working. This procedure takes approximately one hour. There are no restrictions for this procedure. ? ? ?Referrals: ? ?none ? ?Special Instructions // Education: ? ?Continue Plavix ? ?Follow-Up in: 4 months (July 2023) with echocardigram ** Please call the office in May to arrange your follow up** ? ?At the [facility], you and your health needs are our priority. We have a designated team specialized in the treatment of Heart Failure. This Care Team includes your primary Heart Failure Specialized Cardiologist (physician), Advanced Practice Providers (APPs- Physician Assistants and Nurse Practitioners), and Pharmacist who all work together to provide you with the care you need, when you need it.  ? ?You may see any of the following providers on your designated Care Team at your next follow up: ? ?Dr Glori Bickers ?Dr Loralie Champagne ?Darrick Grinder, NP ?Lyda Jester, PA ?Jessica Milford,NP ?Marlyce Huge, PA ?Audry Riles, PharmD ? ? ?Please be sure to bring in all your medications bottles to every appointment.  ? ?Need to Contact us: ? ?If you have any questions or concerns before your next appointment please send [place] a message through [facility] or call our office at [phone].   ? ?TO LEAVE A MESSAGE FOR THE NURSE SELECT OPTION 2, PLEASE LEAVE A MESSAGE INCLUDING: ?YOUR NAME ?DATE OF BIRTH ?CALL BACK NUMBER ?REASON FOR CALL**this is important as we prioritize the call backs ? ?YOU WILL RECEIVE A CALL BACK THE SAME DAY AS LONG AS YOU CALL  BEFORE 4:00 PM ? ? ?

## 2021-11-10 ENCOUNTER — Other Ambulatory Visit: Payer: Self-pay

## 2021-11-10 ENCOUNTER — Telehealth: Payer: Self-pay | Admitting: Pulmonary Disease

## 2021-11-10 ENCOUNTER — Telehealth (HOSPITAL_COMMUNITY): Payer: Self-pay | Admitting: *Deleted

## 2021-11-10 DIAGNOSIS — I5042 Chronic combined systolic (congestive) and diastolic (congestive) heart failure: Secondary | ICD-10-CM

## 2021-11-10 MED ORDER — IPRATROPIUM-ALBUTEROL 0.5-2.5 (3) MG/3ML IN SOLN: 3.0000 mL | Freq: Three times a day (TID) | RESPIRATORY_TRACT | 3 refills | Status: AC

## 2021-11-10 NOTE — Telephone Encounter (Signed)
Sent neb solutions to [employer] with 3 refills incase backorder lasts for a while. Called and notified patient and nothing further is needed at this time.  ?

## 2021-11-10 NOTE — Telephone Encounter (Signed)
Sent in refill to [facility] for [employer] with 3 refills in case back order lasts for a while. Patient was notified of order. Nothing further needed at this time.  ?

## 2021-11-10 NOTE — Telephone Encounter (Signed)
Scarlette Calico, RN  ?11/10/2021  5:02 PM EDT Back to Top  ?  ?Pt aware, agreeable, and verbalized understanding. He is not on any KCL supp, we discussed high K foods we will  make diet adjustments, repeat labs sch 3/27  ? Scarlette Calico, RN  ?11/09/2021  5:23 PM EDT   ?  ?Attempted to call pt, no answer  ? ?

## 2021-11-10 NOTE — Telephone Encounter (Signed)
-----   Message from Larey Dresser, MD sent at 11/09/2021  4:19 PM EDT ----- ?Low K diet, stop any supplemental K, BMET 1 week.  ?

## 2021-11-13 ENCOUNTER — Telehealth: Payer: Self-pay | Admitting: Pulmonary Disease

## 2021-11-13 NOTE — Telephone Encounter (Signed)
Called and spoke to patient. He states his rash has worsened and he wants to be seen today. Offered an appt with an APP and he refused. He states he will only see Dr. Elsworth Soho. Offered him an appt for Wednesday at 41 in [place] (first avail for Dr. Elsworth Soho). Patient agreed to an appt at that time but wanted to know what he should do if rash worsens in meantime. Asked patient if he had a PCP he could see and he refuses to see PCP for rash. Suggested urgent care or a dermatology appt if rash worsens before seeing Dr. Elsworth Soho and patient refused both of those as well.  ?Patient would like message sent to Dr. Elsworth Soho letting him know he is going to see him on Wednesday about rash and would like to know if Dr. Elsworth Soho will see him sooner, he believes he needs to be seen sooner that Wednesday. ?Let patient know that Dr. Elsworth Soho is off today but I will send him a message and get back with tomorrow letting him know what Dr. Elsworth Soho says when he replies. Patient is aware this message will not be addressed by Dr. Elsworth Soho until tomorrow when he is back in office.  ? ?Dr. Elsworth Soho please advise. Thanks  ?

## 2021-11-14 MED ORDER — PREDNISONE 10 MG PO TABS: ORAL_TABLET | ORAL | 0 refills | Status: AC

## 2021-11-14 NOTE — Telephone Encounter (Signed)
Called patient. He voiced understanding of prednisone. Prednisone ordered for [employer].  ?Offered pt an appt this pm with Dr. Elsworth Soho in [facility] and patient couldn't take 2:30 or 2:45 appts due to prior commitments.  ?Patient will keep appt for tomorrow.  ? ?Nothing further needed.  ?

## 2021-11-14 NOTE — Telephone Encounter (Signed)
ATC patient phone line rang for approx. 2 minutes with no option for vm. Will try to call patient again later this am  ?

## 2021-11-15 ENCOUNTER — Encounter: Payer: Self-pay | Admitting: Pulmonary Disease

## 2021-11-15 ENCOUNTER — Other Ambulatory Visit: Payer: Self-pay

## 2021-11-15 ENCOUNTER — Ambulatory Visit (INDEPENDENT_AMBULATORY_CARE_PROVIDER_SITE_OTHER): Payer: Medicare Other | Admitting: Pulmonary Disease

## 2021-11-15 DIAGNOSIS — J449 Chronic obstructive pulmonary disease, unspecified: Secondary | ICD-10-CM

## 2021-11-15 MED ORDER — STIOLTO RESPIMAT 2.5-2.5 MCG/ACT IN AERS: 2.0000 | INHALATION_SPRAY | Freq: Every day | RESPIRATORY_TRACT | 0 refills | Status: DC

## 2021-11-15 NOTE — Progress Notes (Signed)
? ?Subjective:  ? ? Patient ID: Paul Sandoval., male    DOB: 03/09/1941, 81 y.o.   MRN: [id number] ? ?HPI ? ? 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  ?PMH - CKD -3, ischemic cardiomyopathy - EF 30% ? s/p CABG in 1996 and PCI to dLM and SVG-D in 02/2014 ? ? ?Chief Complaint  ?Patient presents with  ? Follow-up  ?  Pt is here for a rash on his arms. Pt states he started Anoro inhaler and has been using it for 40 days and the rash started. Pt states its on his face, neck and arms. Pt states its itchy.   ? ?Started on Anoro 2/9 ? ?He has developed a rash on his forearm and wrist which comes and goes, he has been applying hydrocortisone cream.  He reports this rash has been present in the past even when he was on other medications such as Wixela and Symbicort ?On his last visit 3/6 we decided to continue Anoro because he feels that this medication has really helped him.  Previously was on [place] and this caused oral thrush and stomatitis.  Anoro does not cause this and has helped his breathing better.  We reviewed his medication list extensively and no new medications have been added. ?He remains on Entresto, eplerenone, Farxiga Coreg for at least a year. ?He states that his rash has increased and he has a little bit on his face and neck now.  He has applied some cream to it.  We started him on prednisone yesterday and he took 1 dose feels much better today ? ?Reviewed previous consultations with oncology and cardiology ? ?Significant tests/ events reviewed ? ?08/2021 CT chest wo con >> new area of groundglass left upper lobe ?  ?02/2021 CT chest postirradiation appearance with dense fibrotic consolidation and volume loss right upper lobe, loculated right small effusion with pleural   calcification ?Enlarged subcarinal lymph node stable for more than 2 years ?  ?  ?04/09/2018-echocardiogram-LV ejection fraction 25 to 30%, diffuse hypokinesis, ?  ?02/01/2016-pulmonary function test- FVC 1.96 (45% predicted), postbronchodilator ratio of 67, postbronchodilator FEV1 1.64 (53% predicted, postbronchodilator response, significant mid flow reversibility, DLCO 58 ? ?Review of Systems ?neg for any significant sore throat, dysphagia, itching, sneezing, nasal congestion or excess/ purulent secretions, fever, chills, sweats, unintended wt loss, pleuritic or exertional cp, hempoptysis, orthopnea pnd or change in chronic leg swelling. Also denies presyncope, palpitations, heartburn, abdominal pain, nausea, vomiting, diarrhea or change in bowel or urinary habits, dysuria,hematuria, rash, arthralgias, visual complaints, headache, numbness weakness or ataxia. ? ?   ?Objective:  ? Physical Exam ? ?Gen. Pleasant, obese, in no distress ?ENT - no lesions, no post nasal drip ?Neck: No JVD, no thyromegaly, no carotid bruits ?Lungs: no use of accessory muscles, no dullness to percussion, decreased without rales or rhonchi  ?Cardiovascular: Rhythm regular, heart sounds  normal, no murmurs or gallops, no peripheral edema ?Musculoskeletal: No deformities, no cyanosis or clubbing , no tremors ?Skin -no rash, mild ecchymosis over his forearms ? ? ?   ?Assessment & Plan:  ? ? ?Skin rash -we did a detailed medication review.  He has been on his other heart failure medications for about a year,  he has an appointment coming up with a cardiologist and will discuss with any of those medications can possibly cause a rash. ?I have switched him from Anoro to [employer]. ?We will give him a course of prednisone starting at 40 mg with a rapid taper in 1 week. ?

## 2021-11-15 NOTE — Patient Instructions (Signed)
?  X Sample of stiolto - 2 puffs once daily, start on Sunday ? ?Call me & let me know  ? ?After that options include  ?- stay on stiolto if this works ?- go back to [employer]  ?- alternate anoro with wixela  ? ? ?

## 2021-11-15 NOTE — Assessment & Plan Note (Signed)
I really doubt that Anoro is causing his rash. ?This is really helped his breathing.  But with the rash increasing, we are obligated to stop the Anoro. ?I gave him a sample of Stiolto which also give him LABA/LAMA combination therapy ?He has not tolerated inhaled steroid in the past due to stomatitis ? ?Sample of stiolto - 2 puffs once daily, start on Sunday ? ?After that options include  ?- stay on stiolto if this works ?- go back to [employer]  ?- alternate anoro with wixela  ?

## 2021-11-16 ENCOUNTER — Telehealth: Payer: Self-pay | Admitting: Pulmonary Disease

## 2021-11-16 MED ORDER — FLUTICASONE PROPIONATE 50 MCG/ACT NA SUSP: 1.0000 | Freq: Every day | NASAL | 2 refills | Status: DC | PRN

## 2021-11-16 NOTE — Telephone Encounter (Signed)
I called the patient and he needed a refill of the Flonase sent to [facility]. I have sent refill and patient is aware.  ?

## 2021-11-16 NOTE — Telephone Encounter (Signed)
Attempted to call pt but unable to reach. Left message for him to return call. °

## 2021-11-20 ENCOUNTER — Ambulatory Visit (HOSPITAL_COMMUNITY)
Admission: RE | Admit: 2021-11-20 | Discharge: 2021-11-20 | Disposition: A | Discharge status: Home / Self Care | Payer: Medicare Other | Source: Ambulatory Visit | Attending: Cardiology | Admitting: Cardiology

## 2021-11-20 ENCOUNTER — Other Ambulatory Visit: Payer: Self-pay

## 2021-11-20 DIAGNOSIS — I5042 Chronic combined systolic (congestive) and diastolic (congestive) heart failure: Secondary | ICD-10-CM

## 2021-11-20 LAB — BASIC METABOLIC PANEL
Anion gap: 4 — ABNORMAL LOW (ref 5–15)
BUN: 22 mg/dL (ref 8–23)
CO2: 27 mmol/L (ref 22–32)
Calcium: 8.7 mg/dL — ABNORMAL LOW (ref 8.9–10.3)
Chloride: 110 mmol/L (ref 98–111)
Creatinine, Ser: 1.7 mg/dL — ABNORMAL HIGH (ref 0.61–1.24)
GFR, Estimated: 40 mL/min — ABNORMAL LOW (ref >60)
Glucose, Bld: 120 mg/dL — ABNORMAL HIGH (ref 70–99)
Potassium: 4.4 mmol/L (ref 3.5–5.1)
Sodium: 141 mmol/L (ref 135–145)

## 2021-11-22 ENCOUNTER — Telehealth (HOSPITAL_COMMUNITY): Payer: Self-pay | Admitting: *Deleted

## 2021-11-22 NOTE — Telephone Encounter (Signed)
Pt called for lab results from 3/27. Labs reviewed with pt.  ? ? Larey Dresser, MD  ?11/20/2021 10:04 PM EDT Back to Top  ?  ?Labs ok no changes  ? ?

## 2021-11-27 ENCOUNTER — Telehealth: Payer: Self-pay | Admitting: Pulmonary Disease

## 2021-11-27 MED ORDER — STIOLTO RESPIMAT 2.5-2.5 MCG/ACT IN AERS: 2.0000 | INHALATION_SPRAY | Freq: Every day | RESPIRATORY_TRACT | 3 refills | Status: DC

## 2021-11-27 NOTE — Telephone Encounter (Signed)
Pt called requesting prescription be faxed to the [facility] office so pt can pick up prescription.  Please advise. [phone] ?

## 2021-11-27 NOTE — Telephone Encounter (Signed)
Called and spoke with patient to let him know that RX will be upfront for him to pick up. He expressed understanding. Dr. Melvyn Novas said he is willing to sign for patient. Nothing further needed at this time. ?

## 2021-11-30 ENCOUNTER — Telehealth: Payer: Self-pay | Admitting: Pulmonary Disease

## 2021-11-30 NOTE — Telephone Encounter (Signed)
Called pt and there was no answer-LMTCB °

## 2021-11-30 NOTE — Telephone Encounter (Signed)
Patient is returning phone call. Patient phone number is [phone]. ?

## 2021-12-01 NOTE — Telephone Encounter (Signed)
Attempted to call pt but unable to reach and unable to leave a VM as the line kept ringing and ringing. Will try to call back later. ?

## 2021-12-04 NOTE — Telephone Encounter (Signed)
Pt called back stating Saturday and Sunday he broke in a rash with the new medication.  Bothering eyes and nose, itching on face and neck.  Wanted to be seen by Dr. Elsworth Soho.  Please advise.  [phone] ?

## 2021-12-04 NOTE — Telephone Encounter (Signed)
Pt called back stating Saturday and Sunday he broke in a rash with the new medication.  Bothering eyes and nose, itching on face and neck.  Wanted to be seen by Dr. Elsworth Soho only.  Dr.Alva please advise if ok to double book  ?

## 2021-12-04 NOTE — Telephone Encounter (Signed)
9:45 am slot opened after cancellation in [facility] office. Called patient and he accepted appt. Nothing further needed ?

## 2021-12-05 ENCOUNTER — Encounter: Payer: Self-pay | Admitting: Pulmonary Disease

## 2021-12-05 ENCOUNTER — Ambulatory Visit (INDEPENDENT_AMBULATORY_CARE_PROVIDER_SITE_OTHER): Payer: Medicare Other | Admitting: Pulmonary Disease

## 2021-12-05 DIAGNOSIS — J449 Chronic obstructive pulmonary disease, unspecified: Secondary | ICD-10-CM | POA: Diagnosis not present

## 2021-12-05 NOTE — Assessment & Plan Note (Signed)
Use DuoNebs twice daily for a week. ?Then he can start Wixela twice daily ?

## 2021-12-05 NOTE — Progress Notes (Signed)
? ?Subjective:  ? ? Patient ID: Paul Sandoval., male    DOB: 1941-08-21, 81 y.o.   MRN: [id number] ? ?HPI ? 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  ?PMH - CKD -3, ischemic cardiomyopathy - EF 30% ? s/p CABG in 1996 and PCI to dLM and SVG-D in 02/2014 ? ? ?Chief Complaint  ?Patient presents with  ? Follow-up  ?  Rash has come back. Started back on Friday.   ? ? ?Started on Anoro 2/9 ?  ?He developed a rash on his forearm and wrist which comes and goes, he used hydrocortisone cream.  He reports this rash has been present in the past even when he was on other medications such as Wixela and Symbicort ?On  3/6 we decided to continue Anoro because this  really helped him.  Previously was on [place] and this caused oral thrush and stomatitis.  Anoro does not cause this and  helped his breathing better.   ?He remains on Entresto, eplerenone, Farxiga Coreg for at least a year ? ?Last OV 3/22 - pred course  ?Anoro changed to stiolto ?Rash improved while on prednisone but then has resurfaced, now he has itching on his face and neck. ?He continues on DuoNebs to 3 times daily, breathing is stable. ?He reports cough productive of white sputum ?He complains of itching in his eyes and nose ? ?  ? ?Significant tests/ events reviewed ?08/2021 CT chest wo con >> new area of groundglass left upper lobe ?  ?02/2021 CT chest postirradiation appearance with dense fibrotic consolidation and volume loss right upper lobe, loculated right small effusion with pleural  calcification ?Enlarged subcarinal lymph node stable for more than 2 years ?  ?  ?04/09/2018-echocardiogram-LV ejection fraction 25 to 30%, diffuse hypokinesis, ?  ?02/01/2016-pulmonary function test- FVC 1.96 (45% predicted), postbronchodilator ratio of  67, postbronchodilator FEV1 1.64 (53% predicted, postbronchodilator response, significant mid flow reversibility, DLCO 58 ? ? ? ?Review of Systems ?neg for any significant sore throat, dysphagia, itching, sneezing, nasal congestion or excess/ purulent secretions, fever, chills, sweats, unintended wt loss, pleuritic or exertional cp, hempoptysis, orthopnea pnd or change in chronic leg swelling. Also denies presyncope, palpitations, heartburn, abdominal pain, nausea, vomiting, diarrhea or change in bowel or urinary habits, dysuria,hematuria, rash, arthralgias, visual complaints, headache, numbness weakness or ataxia. ? ?   ?Objective:  ? Physical Exam ? ?Gen. Pleasant, elderly,well-nourished, in no distress ?ENT - no thrush, no pallor/icterus,no post nasal drip ?Neck: No JVD, no thyromegaly, no carotid bruits ?Lungs: no use of accessory muscles, no dullness to percussion, clear without rales or rhonchi  ?Cardiovascular: Rhythm regular, heart sounds  normal, no murmurs or gallops, no peripheral edema ?Musculoskeletal: No deformities, no cyanosis or clubbing  ? ? ? ?   ?Assessment & Plan:  ? ?Skin rash -I am not yet convinced that either Anoro or Stiolto caused this but clearly this is a recurrent problem hence we have to discontinue both.  I asked him to stay off these medications for at least a week and only use DuoNebs thrice daily. ?After that he can get started on Wixela which he has used in the past.  Unfortunately if he develops thrush again we will give him  nystatin. ? ?I reviewed his other medications including cardiac meds which she has been on for at least a year.  If rash persists, he may have to review these meds with cardiology or see an allergist.  He denies seasonal allergies.  He has not tolerated Zyrtec or Claritin in the past but I asked him to also trial Benadryl should this rash recur ?

## 2021-12-05 NOTE — Patient Instructions (Signed)
  STOP stiolto  Use duonebs thrice daily x 1 week Then start Wixela twice daily   OK to take benadryl 25 mg if needed for itching

## 2021-12-10 ENCOUNTER — Encounter (HOSPITAL_COMMUNITY): Payer: Self-pay

## 2021-12-10 ENCOUNTER — Emergency Department (HOSPITAL_COMMUNITY): Payer: No Typology Code available for payment source

## 2021-12-10 ENCOUNTER — Emergency Department (HOSPITAL_COMMUNITY)
Admission: EM | Admit: 2021-12-10 | Discharge: 2021-12-10 | Disposition: A | Discharge status: Home / Self Care | Payer: No Typology Code available for payment source | Attending: Emergency Medicine | Admitting: Emergency Medicine

## 2021-12-10 DIAGNOSIS — Z20822 Contact with and (suspected) exposure to covid-19: Secondary | ICD-10-CM | POA: Insufficient documentation

## 2021-12-10 DIAGNOSIS — J441 Chronic obstructive pulmonary disease with (acute) exacerbation: Secondary | ICD-10-CM | POA: Diagnosis not present

## 2021-12-10 DIAGNOSIS — R0602 Shortness of breath: Secondary | ICD-10-CM | POA: Diagnosis present

## 2021-12-10 DIAGNOSIS — Z85118 Personal history of other malignant neoplasm of bronchus and lung: Secondary | ICD-10-CM | POA: Insufficient documentation

## 2021-12-10 DIAGNOSIS — I110 Hypertensive heart disease with heart failure: Secondary | ICD-10-CM | POA: Diagnosis not present

## 2021-12-10 DIAGNOSIS — J209 Acute bronchitis, unspecified: Secondary | ICD-10-CM

## 2021-12-10 DIAGNOSIS — Z7901 Long term (current) use of anticoagulants: Secondary | ICD-10-CM | POA: Insufficient documentation

## 2021-12-10 DIAGNOSIS — I509 Heart failure, unspecified: Secondary | ICD-10-CM | POA: Insufficient documentation

## 2021-12-10 LAB — RESP PANEL BY RT-PCR (FLU A&B, COVID) ARPGX2
Influenza A by PCR: NEGATIVE
Influenza B by PCR: NEGATIVE
SARS Coronavirus 2 by RT PCR: NEGATIVE

## 2021-12-10 LAB — CBC WITH DIFFERENTIAL/PLATELET
Abs Immature Granulocytes: 0.02 10*3/uL (ref 0.00–0.07)
Basophils Absolute: 0 10*3/uL (ref 0.0–0.1)
Basophils Relative: 0 %
Eosinophils Absolute: 0 10*3/uL (ref 0.0–0.5)
Eosinophils Relative: 1 %
HCT: 36.8 % — ABNORMAL LOW (ref 39.0–52.0)
Hemoglobin: 11.6 g/dL — ABNORMAL LOW (ref 13.0–17.0)
Immature Granulocytes: 0 %
Lymphocytes Relative: 17 %
Lymphs Abs: 1.1 10*3/uL (ref 0.7–4.0)
MCH: 28.2 pg (ref 26.0–34.0)
MCHC: 31.5 g/dL (ref 30.0–36.0)
MCV: 89.5 fL (ref 80.0–100.0)
Monocytes Absolute: 0.6 10*3/uL (ref 0.1–1.0)
Monocytes Relative: 10 %
Neutro Abs: 4.4 10*3/uL (ref 1.7–7.7)
Neutrophils Relative %: 72 %
Platelets: 197 10*3/uL (ref 150–400)
RBC: 4.11 MIL/uL — ABNORMAL LOW (ref 4.22–5.81)
RDW: 15.4 % (ref 11.5–15.5)
WBC: 6.1 10*3/uL (ref 4.0–10.5)
nRBC: 0 % (ref 0.0–0.2)

## 2021-12-10 LAB — BASIC METABOLIC PANEL
Anion gap: 5 (ref 5–15)
BUN: 17 mg/dL (ref 8–23)
CO2: 30 mmol/L (ref 22–32)
Calcium: 8.6 mg/dL — ABNORMAL LOW (ref 8.9–10.3)
Chloride: 106 mmol/L (ref 98–111)
Creatinine, Ser: 1.66 mg/dL — ABNORMAL HIGH (ref 0.61–1.24)
GFR, Estimated: 41 mL/min — ABNORMAL LOW (ref >60)
Glucose, Bld: 106 mg/dL — ABNORMAL HIGH (ref 70–99)
Potassium: 3.9 mmol/L (ref 3.5–5.1)
Sodium: 141 mmol/L (ref 135–145)

## 2021-12-10 LAB — BRAIN NATRIURETIC PEPTIDE: B Natriuretic Peptide: 522.5 pg/mL — ABNORMAL HIGH (ref 0.0–100.0)

## 2021-12-10 LAB — TROPONIN I (HIGH SENSITIVITY): Troponin I (High Sensitivity): 19 ng/L — ABNORMAL HIGH (ref <18)

## 2021-12-10 MED ORDER — DOXYCYCLINE HYCLATE 100 MG PO CAPS: 100.0000 mg | ORAL_CAPSULE | Freq: Two times a day (BID) | ORAL | 0 refills | Status: DC

## 2021-12-10 MED ORDER — ALBUTEROL SULFATE (2.5 MG/3ML) 0.083% IN NEBU
5.0000 mg | INHALATION_SOLUTION | Freq: Once | RESPIRATORY_TRACT | Status: AC
  Administered 2021-12-10: 5 mg via RESPIRATORY_TRACT
  Filled 2021-12-10: qty 6

## 2021-12-10 MED ORDER — PREDNISONE 50 MG PO TABS: 50.0000 mg | ORAL_TABLET | Freq: Every day | ORAL | 0 refills | Status: DC

## 2021-12-10 MED ORDER — IPRATROPIUM-ALBUTEROL 0.5-2.5 (3) MG/3ML IN SOLN
3.0000 mL | Freq: Once | RESPIRATORY_TRACT | Status: AC
Start: 2021-12-10 — End: 2021-12-10
  Administered 2021-12-10: 3 mL via RESPIRATORY_TRACT
  Filled 2021-12-10: qty 3

## 2021-12-10 MED ORDER — METHYLPREDNISOLONE SODIUM SUCC 125 MG IJ SOLR
125.0000 mg | Freq: Once | INTRAMUSCULAR | Status: AC
  Administered 2021-12-10: 125 mg via INTRAVENOUS
  Filled 2021-12-10: qty 2

## 2021-12-10 NOTE — ED Triage Notes (Signed)
Pt presents with c/o insomnia. Pt reports that he has been diagnosed with COPD since 2017 but has had trouble sleeping for the last few nights and he is worried he has fluid on his lungs and would like a chest x-ray. ?

## 2021-12-10 NOTE — ED Provider Notes (Signed)
?[facility] DEPT ?Provider Note ? ? ?CSN: [id number] ?Arrival date & time: 12/10/21  0756 ? ?  ? ?History ? ?Chief Complaint  ?Patient presents with  ? Shortness of Breath  ? ? ?Paul Sandoval. is a 81 y.o. male. ? ?Pt is a 81 yo with a pmhx significant for COPD, CHF, CAD, AAA, htn, gerd, ED, and lung cancer.  Pt said he's been unable to sleep for the past 2-3 nights due to sob.  Pt said he has not had a fever.  His weight has not changed.  He has had a cough.  He saw Dr. Elsworth Soho (pulm) on 4/11.  He is worried he has pneumonia or fluid in his lungs. ? ? ?  ? ?Home Medications ?Prior to Admission medications   ?Medication Sig Start Date End Date Taking? Authorizing Provider  ?acetaminophen (TYLENOL) 500 MG tablet Take 1,000 mg by mouth every 6 (six) hours as needed for fever.   Yes [provider]  ?albuterol (VENTOLIN HFA) 108 (90 Base) MCG/ACT inhaler INHALE 2 PUFFS BY MOUTH EVERY 4 TO 6 HOURS AS NEEDED FOR WHEEZING. ?Patient taking differently: Inhale 2 puffs into the lungs every 4 (four) hours as needed for wheezing or shortness of breath. 01/25/21  Yes Taylor, Malena M, DO  ?carvedilol (COREG) 12.5 MG tablet TAKE 1 AND A HALF TABLET BY MOUTH TWICE DAILY WITH A MEAL ?Patient taking differently: Take 18.75 mg by mouth 2 (two) times daily with a meal. 10/09/21  Yes Larey Dresser, MD  ?clopidogrel (PLAVIX) 75 MG tablet TAKE (1) TABLET BY MOUTH ONCE DAILY. ?Patient taking differently: Take 75 mg by mouth daily. 10/23/21  Yes Larey Dresser, MD  ?dapagliflozin propanediol (FARXIGA) 10 MG TABS tablet Take 10 mg by mouth daily before breakfast. 08/27/19  Yes Larey Dresser, MD  ?Dextromethorphan-guaiFENesin (ROBITUSSIN DM PO) Take 20 mLs by mouth daily as needed (for cough).   Yes [provider]  ?doxycycline (VIBRAMYCIN) 100 MG capsule Take 1 capsule (100 mg total) by mouth 2 (two) times daily. 12/10/21  Yes Isla Pence, MD  ?eplerenone (INSPRA) 50 MG tablet Take  1 tablet (50 mg total) by mouth every evening. 04/05/20  Yes Larey Dresser, MD  ?fluticasone [facility]) 50 MCG/ACT nasal spray Place 1 spray into both nostrils daily as needed for allergies or rhinitis. 11/16/21  Yes Rigoberto Noel, MD  ?furosemide (LASIX) 20 MG tablet Take 20 mg by mouth daily.   Yes [provider]  ?ipratropium-albuterol (DUONEB) 0.5-2.5 (3) MG/3ML SOLN Take 3 mLs by nebulization in the morning, at noon, and at bedtime. 11/10/21  Yes Rigoberto Noel, MD  ?nitroGLYCERIN (NITROSTAT) 0.4 MG SL tablet PLACE 1 TAB UNDER TONGUE EVERY 5 MIN IF NEEDED FOR CHEST PAIN. MAY USE 3 TIMES.NO RELIEF CALL 911. ?Patient taking differently: Place 0.4 mg under the tongue every 5 (five) minutes as needed for chest pain. 07/11/18  Yes Larey Dresser, MD  ?omeprazole (PRILOSEC) 20 MG capsule TAKE ONE CAPSULE BY MOUTH DAILY. ?Patient taking differently: Take 20 mg by mouth daily. 11/06/21  Yes Coral Spikes, DO  ?predniSONE (DELTASONE) 50 MG tablet Take 1 tablet (50 mg total) by mouth daily with breakfast. 12/10/21  Yes Isla Pence, MD  ?rosuvastatin (CRESTOR) 20 MG tablet TAKE ONE TABLET BY MOUTH ONCE DAILY. ?Patient taking differently: Take 20 mg by mouth at bedtime. 12/21/20  Yes Larey Dresser, MD  ?sacubitril-valsartan (ENTRESTO) 97-103 MG Take 0.5 tablets by mouth  2 (two) times daily. 07/13/20  Yes [provider]  ?sodium chloride (OCEAN) 0.65 % SOLN nasal spray Place 1 spray into both nostrils as needed for congestion.   Yes [provider]  ?   ? ?Allergies    ?Neomycin, Anoro ellipta [umeclidinium-vilanterol], Stiolto respimat [tiotropium bromide-olodaterol], and Cetirizine & related   ? ?Review of Systems   ?Review of Systems  ?Respiratory:  Positive for cough and shortness of breath.   ?All other systems reviewed and are negative. ? ?Physical Exam ?Updated Vital Signs ?BP 138/72   Pulse 88   Temp 97.9 ?F (36.6 ?C) (Oral)   Resp (!) 21   SpO2 97%  ?Physical Exam ?Vitals and  nursing note reviewed.  ?Constitutional:   ?   Appearance: He is well-developed.  ?HENT:  ?   Head: Normocephalic and atraumatic.  ?   Mouth/Throat:  ?   Mouth: Mucous membranes are moist.  ?   Pharynx: Oropharynx is clear.  ?Eyes:  ?   Extraocular Movements: Extraocular movements intact.  ?   Pupils: Pupils are equal, round, and reactive to light.  ?Cardiovascular:  ?   Rate and Rhythm: Normal rate and regular rhythm.  ?Pulmonary:  ?   Effort: Pulmonary effort is normal.  ?   Breath sounds: Wheezing and rhonchi present.  ?Abdominal:  ?   General: Bowel sounds are normal.  ?   Palpations: Abdomen is soft.  ?Musculoskeletal:     ?   General: Normal range of motion.  ?   Cervical back: Normal range of motion and neck supple.  ?Skin: ?   General: Skin is warm.  ?   Capillary Refill: Capillary refill takes less than 2 seconds.  ?Neurological:  ?   General: No focal deficit present.  ?   Mental Status: He is alert and oriented to person, place, and time.  ?Psychiatric:     ?   Mood and Affect: Mood normal.     ?   Behavior: Behavior normal.  ? ? ?ED Results / Procedures / Treatments   ?Labs ?(all labs ordered are listed, but only abnormal results are displayed) ?Labs Reviewed  ?BASIC METABOLIC PANEL - Abnormal; Notable for the following components:  ?    Result Value  ? Glucose, Bld 106 (*)   ? Creatinine, Ser 1.66 (*)   ? Calcium 8.6 (*)   ? GFR, Estimated 41 (*)   ? All other components within normal limits  ?BRAIN NATRIURETIC PEPTIDE - Abnormal; Notable for the following components:  ? B Natriuretic Peptide 522.5 (*)   ? All other components within normal limits  ?CBC WITH DIFFERENTIAL/PLATELET - Abnormal; Notable for the following components:  ? RBC 4.11 (*)   ? Hemoglobin 11.6 (*)   ? HCT 36.8 (*)   ? All other components within normal limits  ?TROPONIN I (HIGH SENSITIVITY) - Abnormal; Notable for the following components:  ? Troponin I (High Sensitivity) 19 (*)   ? All other components within normal limits  ?RESP  PANEL BY RT-PCR (FLU A&B, COVID) [id number]  ?TROPONIN I (HIGH SENSITIVITY)  ? ? ?EKG ?EKG Interpretation ? ?Date/Time:  Sunday December 10 2021 10:09:37 EDT ?Ventricular Rate:  84 ?PR Interval:  48 ?QRS Duration: 145 ?QT Interval:  560 ?QTC Calculation: 663 ?R Axis:   94 ?Text Interpretation: Sinus rhythm Short PR interval Left atrial enlargement Right bundle branch block No significant change since last tracing Confirmed by Isla Pence [phone]) on 12/10/2021 10:46:41 AM ? ?Radiology ?  DG Chest Port 1 View ? ?Result Date: 12/10/2021 ?CLINICAL DATA:  81 year old male with history of shortness of breath. EXAM: PORTABLE CHEST 1 VIEW COMPARISON:  Chest x-ray 09/29/2021. 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 Left lung is clear. No left pleural effusion. No pneumothorax. No evidence of pulmonary edema. Heart size is mildly enlarged. Status post median sternotomy for CABG. Atherosclerotic calcifications are noted in the thoracic aorta. IMPRESSION: 1. Unchanged radiographic appearance the chest, as above. No acute findings to account for the patient's symptoms. 2. Aortic atherosclerosis. 3. Mild cardiomegaly. Electronically Signed   By: Vinnie Langton M.D.   On: 12/10/2021 08:42   ? ?Procedures ?Procedures  ? ? ?Medications Ordered in ED ?Medications  ?ipratropium-albuterol (DUONEB) 0.5-2.5 (3) MG/3ML nebulizer solution 3 mL (3 mLs Nebulization Given 12/10/21 0921)  ?methylPREDNISolone sodium succinate (SOLU-MEDROL) 125 mg/2 mL injection 125 mg (125 mg Intravenous Given 12/10/21 0923)  ?albuterol (PROVENTIL) (2.5 MG/3ML) 0.083% nebulizer solution 5 mg (5 mg Nebulization Given 12/10/21 1152)  ? ? ?ED Course/ Medical Decision Making/ A&P ?  ?                         ?Medical Decision Making ?Amount and/or Complexity of Data Reviewed ?Labs: ordered. ?Radiology: ordered. ? ?Risk ?Prescription drug management. ? ? ?This patient presents to the ED

## 2021-12-11 ENCOUNTER — Other Ambulatory Visit: Payer: Self-pay

## 2021-12-11 ENCOUNTER — Telehealth: Payer: Self-pay | Admitting: Pulmonary Disease

## 2021-12-11 ENCOUNTER — Other Ambulatory Visit (HOSPITAL_COMMUNITY): Payer: Self-pay | Admitting: Cardiology

## 2021-12-11 MED ORDER — FLUTICASONE-SALMETEROL 100-50 MCG/ACT IN AEPB: 1.0000 | INHALATION_SPRAY | Freq: Two times a day (BID) | RESPIRATORY_TRACT | 11 refills | Status: DC

## 2021-12-11 MED ORDER — FLUTICASONE-SALMETEROL 100-50 MCG/ACT IN AEPB: 1.0000 | INHALATION_SPRAY | Freq: Two times a day (BID) | RESPIRATORY_TRACT | 11 refills | Status: AC

## 2021-12-11 NOTE — Telephone Encounter (Signed)
ATC patient to notify of response ?

## 2021-12-11 NOTE — Telephone Encounter (Signed)
Called and spoke to patient. He states he went to ED over weekend because Friday and Saturday night he could not breathe lying down. He states that he is better now and not having this issue anymore but that the ED prescribed him doxycycline and prednisone.  ?Patient is supposed to start wixela inhaler and neb meds back tomorrow and wants Dr. Elsworth Soho to clarify he can use inhaler with meds.  ?He also wants Dr. Elsworth Soho to be made aware of ED visit.  ? ?Please advise.  ?Thanks!  ?

## 2021-12-11 NOTE — Telephone Encounter (Signed)
Called and spoke to patient he voiced understanding of recommendations. Patient asked that we print a new prescription and fax to [facility] at [phone] and send it to attn: Dr. Sharmaine BaseVallarie Mare.  ? ?Rx printed and signed by Dr. Melvyn Novas (Dr. Elsworth Soho is not back in [place] office until next week for a signature) and faxed to [facility] along with OV notes from Dr. Elsworth Soho with reasons for change over.  ? ?Called patient back after papers were faxed and notified him of fax being sent and confirmation received.  ? ?Nothing further needed at this time.  ?

## 2021-12-20 ENCOUNTER — Telehealth: Payer: Self-pay | Admitting: Pulmonary Disease

## 2021-12-20 MED ORDER — NYSTATIN 100000 UNIT/ML MT SUSP: OROMUCOSAL | 1 refills | Status: DC

## 2021-12-20 NOTE — Telephone Encounter (Signed)
Called and spoke with patient. He verbalized understanding. RX has been sent to pharmacy. He is aware to call us if anything changes.  ? ?Nothing further needed at time of call.  ?

## 2021-12-20 NOTE — Telephone Encounter (Signed)
Called and spoke with patient. He stated that he resumed the Wixela inhaler on 12/12/21 as instructed. He woke up this morning with 2 sore spots on his tongue. His throat feels slightly irritated as well. He has been rinsing his mouth out each time he uses the inhaler.  ? ?He wanted to know if Dr. Elsworth Soho could send in the swish and swallow solution to [facility] since this has helped before. He also wanted to know if he could have a refill left on the prescription in the event it happens again.  ? ?Dr. Elsworth Soho, can you please advise? Thanks!  ?

## 2021-12-25 ENCOUNTER — Ambulatory Visit: Payer: No Typology Code available for payment source | Admitting: Pulmonary Disease

## 2022-01-01 ENCOUNTER — Encounter: Payer: Self-pay | Admitting: Family Medicine

## 2022-01-01 ENCOUNTER — Ambulatory Visit (INDEPENDENT_AMBULATORY_CARE_PROVIDER_SITE_OTHER): Payer: Medicare Other | Admitting: Family Medicine

## 2022-01-01 DIAGNOSIS — I2510 Atherosclerotic heart disease of native coronary artery without angina pectoris: Secondary | ICD-10-CM | POA: Diagnosis not present

## 2022-01-01 DIAGNOSIS — I5042 Chronic combined systolic (congestive) and diastolic (congestive) heart failure: Secondary | ICD-10-CM

## 2022-01-01 DIAGNOSIS — I10 Essential (primary) hypertension: Secondary | ICD-10-CM

## 2022-01-01 DIAGNOSIS — J449 Chronic obstructive pulmonary disease, unspecified: Secondary | ICD-10-CM

## 2022-01-01 DIAGNOSIS — K219 Gastro-esophageal reflux disease without esophagitis: Secondary | ICD-10-CM

## 2022-01-01 MED ORDER — NITROGLYCERIN 0.4 MG SL SUBL: SUBLINGUAL_TABLET | SUBLINGUAL | 1 refills | Status: AC

## 2022-01-01 MED ORDER — OMEPRAZOLE 40 MG PO CPDR: 40.0000 mg | DELAYED_RELEASE_CAPSULE | Freq: Every day | ORAL | 1 refills | Status: DC

## 2022-01-01 MED ORDER — ALBUTEROL SULFATE HFA 108 (90 BASE) MCG/ACT IN AERS: 2.0000 | INHALATION_SPRAY | Freq: Four times a day (QID) | RESPIRATORY_TRACT | 5 refills | Status: AC | PRN

## 2022-01-01 NOTE — Patient Instructions (Signed)
I have increased your Omeprazole and refilled your inhaler. ? ?Follow up in 6 months or sooner if needed. ? ?Take care ? ?Dr. Lacinda Axon  ?

## 2022-01-01 NOTE — Assessment & Plan Note (Signed)
Stable.  Albuterol refilled. ?

## 2022-01-01 NOTE — Assessment & Plan Note (Signed)
Stable.  Nitroglycerin refilled.  Continue current medications. ?

## 2022-01-01 NOTE — Assessment & Plan Note (Signed)
Euvolemic today. 

## 2022-01-01 NOTE — Assessment & Plan Note (Signed)
Hypertension stable.  Continue carvedilol, eplerenone, Lasix, Entresto. ?

## 2022-01-01 NOTE — Assessment & Plan Note (Signed)
Increase omeprazole to 40mg daily  

## 2022-01-01 NOTE — Progress Notes (Signed)
? ?Subjective:  ?Patient ID: Paul Rigg., male    DOB: Apr 07, 1941  Age: 81 y.o. MRN: [id number] ? ?CC: ?Chief Complaint  ?Patient presents with  ? Hyperlipidemia  ?  COPD flare up on 12/09/21; pt went to ER. Pt has questions regarding some medications; needing refills on Nitroglycerin and Albuterol Inhaler.   ? ? ?HPI: ? ?81 year old male with an extensive past medical history presents for follow-up. ? ?COPD is stable.  He has shortness of breath with activity.  He is able to do his regular activities without any difficulty.  He needs a refill on his albuterol. ? ?From a cardiac perspective, he is stable at this time.  No chest pain.  Does need a refill on nitroglycerin which he has never had to use. ? ?Patient reports recent increase in belching.  He would like to discuss his omeprazole prescription.  He is currently on 20 mg daily. ? ?Patient Active Problem List  ? Diagnosis Date Noted  ? Anemia 07/04/2021  ? Bacterial colonization of lower respiratory tract 03/17/2019  ? History of endovascular stent graft for abdominal aortic aneurysm (AAA) 01/02/2017  ? Cardiomyopathy, ischemic 11/05/2016  ? Chronic combined systolic and diastolic congestive heart failure ([place]) 08/06/2016  ? Primary cancer of right upper lobe of lung ([place]) 11/24/2015  ? CKD (chronic kidney disease) stage 3, GFR 30-59 ml/min (HCC) 11/11/2015  ? Thrombocytopenia ([place]) 02/17/2015  ? Chronic obstructive airway disease with asthma ([place]) 02/17/2015  ? 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 CAD (coronary artery disease) 01/19/2013  ? Cerebrovascular disease 12/18/2011  ? GERD (gastroesophageal reflux disease)   ? Arteriosclerotic cardiovascular disease (ASCVD) 12/06/2009  ? Hyperlipidemia 12/05/2009  ? Essential hypertension 12/05/2009  ? ? ?Social Hx   ?Social History  ? ?Socioeconomic History  ? Marital status: Married  ?  Spouse name: [name]  ? Number of children: 1  ? Years of education: Not on file  ? Highest education level: Not on  file  ?Occupational History  ? Occupation: Retired  ?  Comment: [employer]  ?Tobacco Use  ? Smoking status: Former  ?  Packs/day: 1.50  ?  Years: 30.00  ?  Pack years: 45.00  ?  Types: Cigarettes  ?  Start date: 12/01/1956  ?  Quit date: 01/08/1995  ?  Years since quitting: 27.0  ? Smokeless tobacco: Never  ?Vaping Use  ? Vaping Use: Never used  ?Substance and Sexual Activity  ? Alcohol use: No  ?  Alcohol/week: 0.0 standard drinks  ?  Comment: 03/18/2014 "no alacohol since 1996"  ? Drug use: No  ? Sexual activity: Never  ?Other Topics Concern  ? Not on file  ?Social History Narrative  ? Married since 09/18/1964.   ? 1 son Legrand Como. No grandchildren.  ? ?Social Determinants of Health  ? ?Financial Resource Strain: Low Risk   ? Difficulty of Paying Living Expenses: Not hard at all  ?Food Insecurity: No Food Insecurity  ? Worried About Charity fundraiser in the Last Year: Never true  ? Ran Out of Food in the Last Year: Never true  ?Transportation Needs: No Transportation Needs  ? Lack of Transportation (Medical): No  ? Lack of Transportation (Non-Medical): No  ?Physical Activity: Insufficiently Active  ? Days of Exercise per Week: 3 days  ? Minutes of Exercise per Session: 20 min  ?Stress: No Stress Concern Present  ? Feeling of Stress : Not at all  ?Social Connections: Socially Integrated  ?  Frequency of Communication with Friends and Family: More than three times a week  ? Frequency of Social Gatherings with Friends and Family: Three times a week  ? Attends Religious Services: More than 4 times per year  ? Active Member of Clubs or Organizations: Yes  ? Attends Archivist Meetings: More than 4 times per year  ? Marital Status: Married  ? ? ?Review of Systems ?Per HPI ? ?Objective:  ?BP 118/70   Pulse 75   Temp (!) 97.4 ?F (36.3 ?C)   Wt 179 lb 9.6 oz (81.5 kg)   SpO2 97%   BMI 25.77 kg/m?  ? ? ?  01/01/2022  ? 10:42 AM 12/10/2021  ? 12:30 PM 12/10/2021  ? 11:45 AM  ?BP/Weight  ?Systolic BP 341 937  902  ?Diastolic BP 70 70 72  ?Wt. (Lbs) 179.6    ?BMI 25.77 kg/m2    ? ? ?Physical Exam ?Constitutional:   ?   General: He is not in acute distress. ?   Appearance: Normal appearance. He is not ill-appearing.  ?HENT:  ?   Head: Normocephalic and atraumatic.  ?Eyes:  ?   General:     ?   Right eye: No discharge.     ?   Left eye: No discharge.  ?   Conjunctiva/sclera: Conjunctivae normal.  ?Cardiovascular:  ?   Rate and Rhythm: Normal rate and regular rhythm.  ?Pulmonary:  ?   Effort: Pulmonary effort is normal.  ?   Breath sounds: No wheezing.  ?Neurological:  ?   Mental Status: He is alert.  ?Psychiatric:     ?   Mood and Affect: Mood normal.     ?   Behavior: Behavior normal.  ? ? ?Lab Results  ?Component Value Date  ? WBC 6.1 12/10/2021  ? HGB 11.6 (L) 12/10/2021  ? HCT 36.8 (L) 12/10/2021  ? PLT 197 12/10/2021  ? GLUCOSE 106 (H) 12/10/2021  ? CHOL 116 11/09/2021  ? TRIG 99 11/09/2021  ? HDL 36 (L) 11/09/2021  ? [place] 60 11/09/2021  ? ALT 9 09/15/2021  ? AST 15 09/15/2021  ? NA 141 12/10/2021  ? K 3.9 12/10/2021  ? CL 106 12/10/2021  ? CREATININE 1.66 (H) 12/10/2021  ? BUN 17 12/10/2021  ? CO2 30 12/10/2021  ? TSH 2.66 09/19/2017  ? PSA 1.04 08/18/2014  ? INR 1.04 05/15/2017  ? ? ? ?Assessment & Plan:  ? ?Problem List Items Addressed This Visit   ? ?  ? Cardiovascular and Mediastinum  ? Chronic combined systolic and diastolic congestive heart failure (HCC) (Chronic)  ?  Euvolemic today. ? ?  ?  ? Relevant Medications  ? nitroGLYCERIN (NITROSTAT) 0.4 MG SL tablet  ? Essential hypertension (Chronic)  ?  Hypertension stable.  Continue carvedilol, eplerenone, Lasix, Entresto. ? ?  ?  ? Relevant Medications  ? nitroGLYCERIN (NITROSTAT) 0.4 MG SL tablet  ? CAD (coronary artery disease)  ?  Stable.  Nitroglycerin refilled.  Continue current medications. ? ?  ?  ? Relevant Medications  ? nitroGLYCERIN (NITROSTAT) 0.4 MG SL tablet  ?  ? Respiratory  ? Chronic obstructive airway disease with asthma (HCC) (Chronic)  ?   Stable.  Albuterol refilled. ? ?  ?  ? Relevant Medications  ? albuterol (VENTOLIN HFA) 108 (90 Base) MCG/ACT inhaler  ?  ? Digestive  ? GERD (gastroesophageal reflux disease)  ?  Increase omeprazole to 40 mg daily. ? ?  ?  ?  Relevant Medications  ? omeprazole (PRILOSEC) 40 MG capsule  ? ? ?Meds ordered this encounter  ?Medications  ? albuterol (VENTOLIN HFA) 108 (90 Base) MCG/ACT inhaler  ?  Sig: Inhale 2 puffs into the lungs every 6 (six) hours as needed for wheezing or shortness of breath.  ?  Dispense:  18 g  ?  Refill:  5  ? omeprazole (PRILOSEC) 40 MG capsule  ?  Sig: Take 1 capsule (40 mg total) by mouth daily.  ?  Dispense:  90 capsule  ?  Refill:  1  ? nitroGLYCERIN (NITROSTAT) 0.4 MG SL tablet  ?  Sig: PLACE 1 TAB UNDER TONGUE EVERY 5 MIN IF NEEDED FOR CHEST PAIN. MAY USE 3 TIMES.NO RELIEF CALL 911.  ?  Dispense:  25 tablet  ?  Refill:  1  ? ? ?Follow-up:  Return in about 6 months (around 07/04/2022). ? ?Thersa Salt DO ?[place] ? ?

## 2022-01-23 ENCOUNTER — Other Ambulatory Visit (HOSPITAL_COMMUNITY): Payer: Self-pay | Admitting: Cardiology

## 2022-01-23 DIAGNOSIS — C3411 Malignant neoplasm of upper lobe, right bronchus or lung: Secondary | ICD-10-CM

## 2022-02-12 ENCOUNTER — Other Ambulatory Visit (HOSPITAL_COMMUNITY): Payer: Self-pay | Admitting: Cardiology

## 2022-02-18 ENCOUNTER — Emergency Department (HOSPITAL_COMMUNITY)
Admission: EM | Admit: 2022-02-18 | Discharge: 2022-02-18 | Disposition: A | Discharge status: Home / Self Care | Payer: No Typology Code available for payment source | Attending: Emergency Medicine | Admitting: Emergency Medicine

## 2022-02-18 ENCOUNTER — Emergency Department (HOSPITAL_COMMUNITY): Payer: No Typology Code available for payment source

## 2022-02-18 ENCOUNTER — Other Ambulatory Visit: Payer: Self-pay

## 2022-02-18 ENCOUNTER — Encounter (HOSPITAL_COMMUNITY): Payer: Self-pay | Admitting: Emergency Medicine

## 2022-02-18 DIAGNOSIS — J441 Chronic obstructive pulmonary disease with (acute) exacerbation: Secondary | ICD-10-CM | POA: Insufficient documentation

## 2022-02-18 DIAGNOSIS — Z7951 Long term (current) use of inhaled steroids: Secondary | ICD-10-CM | POA: Diagnosis not present

## 2022-02-18 DIAGNOSIS — Z79899 Other long term (current) drug therapy: Secondary | ICD-10-CM | POA: Diagnosis not present

## 2022-02-18 DIAGNOSIS — Z7902 Long term (current) use of antithrombotics/antiplatelets: Secondary | ICD-10-CM | POA: Diagnosis not present

## 2022-02-18 DIAGNOSIS — Z85118 Personal history of other malignant neoplasm of bronchus and lung: Secondary | ICD-10-CM | POA: Insufficient documentation

## 2022-02-18 DIAGNOSIS — Z96642 Presence of left artificial hip joint: Secondary | ICD-10-CM | POA: Diagnosis not present

## 2022-02-18 DIAGNOSIS — Z951 Presence of aortocoronary bypass graft: Secondary | ICD-10-CM | POA: Diagnosis not present

## 2022-02-18 DIAGNOSIS — Z955 Presence of coronary angioplasty implant and graft: Secondary | ICD-10-CM | POA: Insufficient documentation

## 2022-02-18 DIAGNOSIS — I509 Heart failure, unspecified: Secondary | ICD-10-CM | POA: Diagnosis not present

## 2022-02-18 DIAGNOSIS — R051 Acute cough: Secondary | ICD-10-CM

## 2022-02-18 DIAGNOSIS — N189 Chronic kidney disease, unspecified: Secondary | ICD-10-CM | POA: Diagnosis not present

## 2022-02-18 DIAGNOSIS — I130 Hypertensive heart and chronic kidney disease with heart failure and stage 1 through stage 4 chronic kidney disease, or unspecified chronic kidney disease: Secondary | ICD-10-CM | POA: Diagnosis not present

## 2022-02-18 DIAGNOSIS — I2510 Atherosclerotic heart disease of native coronary artery without angina pectoris: Secondary | ICD-10-CM | POA: Insufficient documentation

## 2022-02-18 DIAGNOSIS — Z20822 Contact with and (suspected) exposure to covid-19: Secondary | ICD-10-CM | POA: Insufficient documentation

## 2022-02-18 DIAGNOSIS — Z7952 Long term (current) use of systemic steroids: Secondary | ICD-10-CM | POA: Insufficient documentation

## 2022-02-18 DIAGNOSIS — R0602 Shortness of breath: Secondary | ICD-10-CM | POA: Diagnosis present

## 2022-02-18 LAB — CBC WITH DIFFERENTIAL/PLATELET
Abs Immature Granulocytes: 0.01 10*3/uL (ref 0.00–0.07)
Basophils Absolute: 0 10*3/uL (ref 0.0–0.1)
Basophils Relative: 0 %
Eosinophils Absolute: 0 10*3/uL (ref 0.0–0.5)
Eosinophils Relative: 0 %
HCT: 38 % — ABNORMAL LOW (ref 39.0–52.0)
Hemoglobin: 11.8 g/dL — ABNORMAL LOW (ref 13.0–17.0)
Immature Granulocytes: 0 %
Lymphocytes Relative: 12 %
Lymphs Abs: 1 10*3/uL (ref 0.7–4.0)
MCH: 27.6 pg (ref 26.0–34.0)
MCHC: 31.1 g/dL (ref 30.0–36.0)
MCV: 89 fL (ref 80.0–100.0)
Monocytes Absolute: 0.8 10*3/uL (ref 0.1–1.0)
Monocytes Relative: 10 %
Neutro Abs: 6.1 10*3/uL (ref 1.7–7.7)
Neutrophils Relative %: 78 %
Platelets: 99 10*3/uL — ABNORMAL LOW (ref 150–400)
RBC: 4.27 MIL/uL (ref 4.22–5.81)
RDW: 15.5 % (ref 11.5–15.5)
WBC: 7.9 10*3/uL (ref 4.0–10.5)
nRBC: 0 % (ref 0.0–0.2)

## 2022-02-18 LAB — BASIC METABOLIC PANEL
Anion gap: 8 (ref 5–15)
BUN: 23 mg/dL (ref 8–23)
CO2: 26 mmol/L (ref 22–32)
Calcium: 9 mg/dL (ref 8.9–10.3)
Chloride: 104 mmol/L (ref 98–111)
Creatinine, Ser: 1.82 mg/dL — ABNORMAL HIGH (ref 0.61–1.24)
GFR, Estimated: 37 mL/min — ABNORMAL LOW (ref >60)
Glucose, Bld: 101 mg/dL — ABNORMAL HIGH (ref 70–99)
Potassium: 4.1 mmol/L (ref 3.5–5.1)
Sodium: 138 mmol/L (ref 135–145)

## 2022-02-18 LAB — BLOOD GAS, VENOUS
Acid-Base Excess: 3.4 mmol/L — ABNORMAL HIGH (ref 0.0–2.0)
Bicarbonate: 29.9 mmol/L — ABNORMAL HIGH (ref 20.0–28.0)
O2 Saturation: 28.6 %
Patient temperature: 37
pCO2, Ven: 53 mmHg (ref 44–60)
pH, Ven: 7.36 (ref 7.25–7.43)
pO2, Ven: <31 mmHg — CL (ref 32–45)

## 2022-02-18 LAB — RESP PANEL BY RT-PCR (FLU A&B, COVID) ARPGX2
Influenza A by PCR: NEGATIVE
Influenza B by PCR: NEGATIVE
SARS Coronavirus 2 by RT PCR: NEGATIVE

## 2022-02-18 LAB — TROPONIN I (HIGH SENSITIVITY)
Troponin I (High Sensitivity): 17 ng/L (ref <18)
Troponin I (High Sensitivity): 14 ng/L (ref <18)

## 2022-02-18 LAB — BRAIN NATRIURETIC PEPTIDE: B Natriuretic Peptide: 697.7 pg/mL — ABNORMAL HIGH (ref 0.0–100.0)

## 2022-02-18 MED ORDER — DOXYCYCLINE HYCLATE 100 MG PO CAPS: 100.0000 mg | ORAL_CAPSULE | Freq: Two times a day (BID) | ORAL | 0 refills | Status: DC

## 2022-02-18 MED ORDER — PREDNISONE 20 MG PO TABS: 40.0000 mg | ORAL_TABLET | Freq: Every day | ORAL | 0 refills | Status: AC

## 2022-02-18 MED ORDER — IPRATROPIUM-ALBUTEROL 0.5-2.5 (3) MG/3ML IN SOLN
3.0000 mL | Freq: Once | RESPIRATORY_TRACT | Status: AC
  Administered 2022-02-18: 3 mL via RESPIRATORY_TRACT
  Filled 2022-02-18: qty 3

## 2022-02-18 MED ORDER — METHYLPREDNISOLONE SODIUM SUCC 125 MG IJ SOLR
62.5000 mg | Freq: Once | INTRAMUSCULAR | Status: AC
  Administered 2022-02-18: 62.5 mg via INTRAVENOUS
  Filled 2022-02-18: qty 2

## 2022-02-18 MED ORDER — BENZONATATE 100 MG PO CAPS: 100.0000 mg | ORAL_CAPSULE | Freq: Three times a day (TID) | ORAL | 0 refills | Status: DC | PRN

## 2022-02-18 NOTE — ED Provider Notes (Addendum)
[facility]-EMERGENCY DEPT Provider Note   CSN: [id number] Arrival date & time: 02/18/22  1438     History  Chief Complaint  Patient presents with   Shortness of Breath    Paul Sandoval. is a 81 y.o. male.  Patient as above with significant medical history as below, including COPD, CAD, CHF, exertional dyspnea, HLD, HTN prior tobacco use who presents to the ED with complaint of worsening cough, mildly worsening dyspnea.  History of COPD, but having productive cough with yellow/brown sputum last 48 hours.  Mildly dyspneic when lying down flat but this is typical for him.  Mildly worsened.  No significant exertional dyspnea, low-grade fever this morning improved with APAP.  Tmax 100.  No nausea or vomiting, no change to bowel or bladder function.  No abdominal pain.  No chest pain.  No chills, no recent travel or sick contacts but no recent medication change.  Home oxygen use.  Compliant with home inhalers, has not had to increase his rescue inhaler use.  No significant weight changes.  Patient concern for pneumonia.  Follows with pulmonology Dr. Vassie Loll.     Past Medical History:  Diagnosis Date   AAA (abdominal aortic aneurysm) (HCC) 2010   4.4 cm 08/2008;4.44 in 7/10 and 4.65 in 08/2009; 4.8 by CT in 11/2009; 4.3 by ultrasound in 08/2010   Anemia    Arteriosclerotic cardiovascular disease (ASCVD) 1996   CABG-1996   Arthritis    "fingers" (03/18/2014)   CAD (coronary artery disease)    03/18/14:  PCI with DES to distal left main. 7/29: DES to the SVG to Diag   Cancer [facility])    Upper right lobe lung cancer   Cardiomyopathy, ischemic    Echo 03/17/14: EF 45-50%   CHF (congestive heart failure) (HCC)    Chronic bronchitis (HCC)    Chronic kidney disease    CRF   Chronic rhinitis    Colonic polyp 2002   polypectomy in 2002   COPD (chronic obstructive pulmonary disease) (HCC)    Diverticulosis    Dyspnea    with exertion   ED (erectile dysfunction)     Encounter for antineoplastic chemotherapy 12/19/2015   GERD (gastroesophageal reflux disease)    History of blood transfusion    Hyperlipidemia    Hypertension    IFG (impaired fasting glucose)    Myocardial infarction [facility])    "told h/o silent MI sometime before 1996"   Pneumonia ~ 2001; ~ 2005    has had more than twice   Right bundle branch block    Tobacco abuse, in remission    40 pack year total consumption; discontinued in 1996    Past Surgical History:  Procedure Laterality Date   ABDOMINAL AORTIC ANEURYSM REPAIR  11/2012   ABDOMINAL AORTIC ENDOVASCULAR STENT GRAFT N/A 12/11/2012   Procedure: ABDOMINAL AORTIC ENDOVASCULAR STENT GRAFT;  Surgeon: Pryor Ochoa, MD;  Location: [facility] OR;  Service: Vascular;  Laterality: N/A;  Ultrasound guided; Gore   CARDIAC CATHETERIZATION  01/08/1995   COLONOSCOPY  2002   polypectomy-patient denies   CORONARY ANGIOPLASTY WITH STENT PLACEMENT  03/18/2014   "1"   CORONARY ANGIOPLASTY WITH STENT PLACEMENT  03/24/2014   "1"   CORONARY ARTERY BYPASS GRAFT  01/09/1995   "CABG X3"   FLEXIBLE BRONCHOSCOPY N/A 03/01/2017   Procedure: FLEXIBLE BRONCHOSCOPY WITH BIOPSIES;  Surgeon: Alleen Borne, MD;  Location: [facility] OR;  Service: Thoracic;  Laterality: N/A;   JOINT REPLACEMENT  LAPAROSCOPIC CHOLECYSTECTOMY  12/2009   LEFT AND RIGHT HEART CATHETERIZATION WITH CORONARY/GRAFT ANGIOGRAM N/A 03/18/2014   Procedure: LEFT AND RIGHT HEART CATHETERIZATION WITH Isabel Caprice;  Surgeon: Micheline Chapman, MD;  Location: [facility] CATH LAB;  Service: Cardiovascular;  Laterality: N/A;   PERCUTANEOUS CORONARY STENT INTERVENTION (PCI-S)  03/18/2014   Procedure: PERCUTANEOUS CORONARY STENT INTERVENTION (PCI-S);  Surgeon: Micheline Chapman, MD;  Location: [facility] CATH LAB;  Service: Cardiovascular;;   PERCUTANEOUS CORONARY STENT INTERVENTION (PCI-S) N/A 03/24/2014   Procedure: PERCUTANEOUS CORONARY STENT INTERVENTION (PCI-S);  Surgeon: Micheline Chapman, MD;  Location: [facility] CATH LAB;   Service: Cardiovascular;  Laterality: N/A;   PLEURAL EFFUSION DRAINAGE Right 03/01/2017   Procedure: DRAINAGE OF PLEURAL EFFUSION;  Surgeon: Alleen Borne, MD;  Location: [facility] OR;  Service: Thoracic;  Laterality: Right;   RIGHT/LEFT HEART CATH AND CORONARY/GRAFT ANGIOGRAPHY N/A 05/15/2017   Procedure: RIGHT/LEFT HEART CATH AND CORONARY/GRAFT ANGIOGRAPHY;  Surgeon: Laurey Morale, MD;  Location: [facility] INVASIVE CV LAB;  Service: Cardiovascular;  Laterality: N/A;   TALC PLEURODESIS Right 03/01/2017   Procedure: Lurlean Nanny;  Surgeon: Alleen Borne, MD;  Location: [facility] OR;  Service: Thoracic;  Laterality: Right;   TOTAL HIP ARTHROPLASTY Left 01/21/2013   Procedure: TOTAL HIP ARTHROPLASTY ANTERIOR APPROACH;  Surgeon: Shelda Pal, MD;  Location: [facility] OR;  Service: Orthopedics;  Laterality: Left;   VIDEO ASSISTED THORACOSCOPY Right 03/01/2017   Procedure: VIDEO ASSISTED THORACOSCOPY WITH BIOPSIES;  Surgeon: Alleen Borne, MD;  Location: [facility] OR;  Service: Thoracic;  Laterality: Right;   VIDEO BRONCHOSCOPY N/A 11/17/2015   Procedure: VIDEO BRONCHOSCOPY WITH FLUORO;  Surgeon: Oretha Milch, MD;  Location: [facility] ENDOSCOPY;  Service: Cardiopulmonary;  Laterality: N/A;     The history is provided by the patient and the spouse. No language interpreter was used.  Shortness of Breath Associated symptoms: cough and fever   Associated symptoms: no abdominal pain, no chest pain, no headaches, no rash and no vomiting        Home Medications Prior to Admission medications   Medication Sig Start Date End Date Taking? Authorizing Provider  doxycycline (VIBRAMYCIN) 100 MG capsule Take 1 capsule (100 mg total) by mouth 2 (two) times daily. 02/18/22  Yes Sloan Leiter, DO  predniSONE (DELTASONE) 20 MG tablet Take 2 tablets (40 mg total) by mouth daily for 5 days. 02/18/22 02/23/22 Yes Sloan Leiter, DO  acetaminophen (TYLENOL) 500 MG tablet Take 1,000 mg by mouth every 6 (six) hours as needed for fever.    [provider]  albuterol (VENTOLIN HFA) 108 (90 Base) MCG/ACT inhaler Inhale 2 puffs into the lungs every 6 (six) hours as needed for wheezing or shortness of breath. 01/01/22   Tommie Sams, DO  benzonatate (TESSALON) 100 MG capsule Take 1 capsule (100 mg total) by mouth 3 (three) times daily as needed for cough. 02/18/22   Tanda Rockers A, DO  carvedilol (COREG) 12.5 MG tablet TAKE 1 &1/2 TABLET BY MOUTH TWICE DAILY WITH A MEAL 02/12/22   Laurey Morale, MD  clopidogrel (PLAVIX) 75 MG tablet TAKE (1) TABLET BY MOUTH ONCE DAILY. 01/23/22   Laurey Morale, MD  dapagliflozin propanediol (FARXIGA) 10 MG TABS tablet Take 10 mg by mouth daily before breakfast. 08/27/19   Laurey Morale, MD  Dextromethorphan-guaiFENesin (ROBITUSSIN DM PO) Take 20 mLs by mouth daily as needed (for cough).    [provider]  eplerenone (INSPRA) 50 MG tablet Take 1 tablet (  50 mg total) by mouth every evening. 04/05/20   Laurey Morale, MD  fluticasone [facility]) 50 MCG/ACT nasal spray Place 1 spray into both nostrils daily as needed for allergies or rhinitis. 11/16/21   Oretha Milch, MD  fluticasone-salmeterol (WIXELA INHUB) 100-50 MCG/ACT AEPB Inhale 1 puff into the lungs 2 (two) times daily. 12/11/21   Oretha Milch, MD  furosemide (LASIX) 20 MG tablet Take 20 mg by mouth daily.    [provider]  ipratropium-albuterol (DUONEB) 0.5-2.5 (3) MG/3ML SOLN Take 3 mLs by nebulization in the morning, at noon, and at bedtime. 11/10/21   Oretha Milch, MD  nitroGLYCERIN (NITROSTAT) 0.4 MG SL tablet PLACE 1 TAB UNDER TONGUE EVERY 5 MIN IF NEEDED FOR CHEST PAIN. MAY USE 3 TIMES.NO RELIEF CALL 911. 01/01/22   Tommie Sams, DO  omeprazole (PRILOSEC) 40 MG capsule Take 1 capsule (40 mg total) by mouth daily. 01/01/22   Tommie Sams, DO  rosuvastatin (CRESTOR) 20 MG tablet TAKE ONE TABLET BY MOUTH ONCE DAILY. 12/11/21   Laurey Morale, MD  sacubitril-valsartan (ENTRESTO) 97-103 MG Take 0.5 tablets by mouth 2 (two)  times daily. 07/13/20   [provider]  sodium chloride (OCEAN) 0.65 % SOLN nasal spray Place 1 spray into both nostrils as needed for congestion.    [provider]      Allergies    Neomycin, Anoro ellipta [umeclidinium-vilanterol], Stiolto respimat [tiotropium bromide-olodaterol], and Cetirizine & related    Review of Systems   Review of Systems  Constitutional:  Positive for fever. Negative for chills.  HENT:  Negative for facial swelling and trouble swallowing.   Eyes:  Negative for photophobia and visual disturbance.  Respiratory:  Positive for cough and shortness of breath.   Cardiovascular:  Negative for chest pain and palpitations.  Gastrointestinal:  Negative for abdominal pain, nausea and vomiting.  Endocrine: Negative for polydipsia and polyuria.  Genitourinary:  Negative for difficulty urinating and hematuria.  Musculoskeletal:  Negative for gait problem and joint swelling.  Skin:  Negative for pallor and rash.  Neurological:  Negative for syncope and headaches.  Psychiatric/Behavioral:  Negative for agitation and confusion.     Physical Exam Updated Vital Signs BP 138/65   Pulse 90   Temp 98 F (36.7 C) (Oral)   Resp 17   Ht 5\' 10"  (1.778 m)   Wt 81.2 kg   SpO2 92%   BMI 25.68 kg/m  Physical Exam Vitals and nursing note reviewed.  Constitutional:      General: He is not in acute distress.    Appearance: He is well-developed. He is not ill-appearing.  HENT:     Head: Normocephalic and atraumatic.     Right Ear: External ear normal.     Left Ear: External ear normal.     Mouth/Throat:     Mouth: Mucous membranes are moist.  Eyes:     General: No scleral icterus. Cardiovascular:     Rate and Rhythm: Normal rate and regular rhythm.     Pulses: Normal pulses.     Heart sounds: Normal heart sounds.  Pulmonary:     Effort: Pulmonary effort is normal. No tachypnea, accessory muscle usage or respiratory distress.     Breath sounds:  Decreased breath sounds and wheezing present.  Abdominal:     General: Abdomen is flat.     Palpations: Abdomen is soft.     Tenderness: There is no abdominal tenderness. There is no guarding or  rebound.  Musculoskeletal:        General: Normal range of motion.     Cervical back: Normal range of motion.     Right lower leg: No edema.     Left lower leg: No edema.  Skin:    General: Skin is warm and dry.     Capillary Refill: Capillary refill takes less than 2 seconds.  Neurological:     Mental Status: He is alert and oriented to person, place, and time.  Psychiatric:        Mood and Affect: Mood normal.        Behavior: Behavior normal.     ED Results / Procedures / Treatments   Labs (all labs ordered are listed, but only abnormal results are displayed) Labs Reviewed  BASIC METABOLIC PANEL - Abnormal; Notable for the following components:      Result Value   Glucose, Bld 101 (*)    Creatinine, Ser 1.82 (*)    GFR, Estimated 37 (*)    All other components within normal limits  BRAIN NATRIURETIC PEPTIDE - Abnormal; Notable for the following components:   B Natriuretic Peptide 697.7 (*)    All other components within normal limits  CBC WITH DIFFERENTIAL/PLATELET - Abnormal; Notable for the following components:   Hemoglobin 11.8 (*)    HCT 38.0 (*)    Platelets 99 (*)    All other components within normal limits  BLOOD GAS, VENOUS - Abnormal; Notable for the following components:   pO2, Ven <31 (*)    Bicarbonate 29.9 (*)    Acid-Base Excess 3.4 (*)    All other components within normal limits  RESP PANEL BY RT-PCR (FLU A&B, COVID) ARPGX2  TROPONIN I (HIGH SENSITIVITY)  TROPONIN I (HIGH SENSITIVITY)    EKG EKG Interpretation  Date/Time:  Sunday February 18 2022 14:49:12 EDT Ventricular Rate:  82 PR Interval:  198 QRS Duration: 150 QT Interval:  397 QTC Calculation: 464 R Axis:   94 Text Interpretation: Sinus rhythm Atrial premature complex Right bundle branch block  Abnrm T, consider ischemia, anterolateral lds similar to prior Confirmed by Tanda Rockers ([id number]) on 02/18/2022 3:12:57 PM  Radiology DG Chest 2 View  Result Date: 02/18/2022 CLINICAL DATA:  Shortness of breath, cough and fever for 2 days. EXAM: CHEST - 2 VIEW COMPARISON:  12/10/2021 FINDINGS: Status post median sternotomy CABG procedure. Again noted is asymmetric opacification within the right apex corresponding to chronic loculated pleural fluid. Right mid and upper lobe perihilar consolidation is again noted in appears unchanged compatible with changes secondary to external beam radiation. 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 Left lung appears clear. No signs of acute airspace disease to suggest pneumonia. No acute osseous findings noted. IMPRESSION: 1. No acute cardiopulmonary abnormalities. 2. Chronic right apical and right upper lobe lung opacities and right lung base opacities corresponding to areas of loculated fluid and post treatment changes. Electronically Signed   By: Signa Kell M.D.   On: 02/18/2022 15:49    Procedures Procedures    Medications Ordered in ED Medications  ipratropium-albuterol (DUONEB) 0.5-2.5 (3) MG/3ML nebulizer solution 3 mL (3 mLs Nebulization Given 02/18/22 1544)  methylPREDNISolone sodium succinate (SOLU-MEDROL) 125 mg/2 mL injection 62.5 mg (62.5 mg Intravenous Given 02/18/22 1546)    ED Course/ Medical Decision Making/ A&P  Medical Decision Making Amount and/or Complexity of Data Reviewed Labs: ordered. Radiology: ordered.  Risk Prescription drug management.    CC: cough/dib  This patient presents to the Emergency Department for the above complaint. This involves an extensive number of treatment options and is a complaint that carries with it a high risk of complications and morbidity. Vital signs were reviewed. Serious etiologies  considered.  In my evaluation of this patient's dyspnea my DDx includes, but is not limited to, pneumonia, pulmonary embolism, pneumothorax, pulmonary edema, metabolic acidosis, asthma, COPD, cardiac cause, anemia, anxiety, etc.   Record review:  Previous records obtained and reviewed prior labs and imaging, prior ED visits.  PCP notes  Additional history obtained from family at bedside  Medical and surgical history as noted above.   Work up as above, notable for:  Labs & imaging results that were available during my care of the patient were visualized by me and considered in my medical decision making.   I ordered imaging studies which included chest x-ray. I visualized the imaging, interpreted images, and I agree with radiologist interpretation.  Stable appearance from prior.  No acute infectious abnormalities appreciated  Cardiac monitoring reviewed and interpreted personally which shows NSR  BNP is elevated, comparable to his baseline, he has chronic loculated pleural effusion.  Unchanged on imaging today.  He is not hypoxic.  Labs reviewed and are stable, similar to his baseline.  Troponin negative x2.  ECG without acute ischemic changes.  No chest pain.  ACS is unlikely.  Management: Nebulized breathing treatment, steroid  ED Course:     Reassessment:  Patient is feeling better.  No desaturation with ambulatory pulse ox.  Favor mild COPD exacerbation as etiology of patient's cough and mild dyspnea today.  Reasonable to start patient on oral antibiotics for COPD exacerbation and oral steroids.  He does not need refills on his home inhalers.  Advised patient follow-up with his primary care doctor for recheck.  Return to the ED if worse  Admission was considered.     Dyspnea most suggestive of COPD exacerbation. Very low suspicion of alternative serious thoracic etiologies. Treated with NMTs and steroids with improvement in oxygenation and work of breathing. CXR reviewed. Patient  improved and appears to be appropriate for OP management with close PCP F/U.  The patient improved significantly and was discharged in stable condition. Detailed discussions were had with the patient regarding current findings, and need for close f/u with PCP or on call doctor. The patient has been instructed to return immediately if the symptoms worsen in any way for re-evaluation. Patient verbalized understanding and is in agreement with current care plan. All questions answered prior to discharge.          Social determinants of health include -  Social History   Socioeconomic History   Marital status: Married    Spouse name: Ann   Number of children: 1   Years of education: Not on file   Highest education level: Not on file  Occupational History   Occupation: Retired    Comment: [employer]  Tobacco Use   Smoking status: Former    Packs/day: 1.50    Years: 30.00    Total pack years: 45.00    Types: Cigarettes    Start date: 12/01/1956    Quit date: 01/08/1995    Years since quitting: 27.1   Smokeless tobacco: Never  Vaping Use   Vaping Use: Never used  Substance and Sexual Activity   Alcohol  use: No    Alcohol/week: 0.0 standard drinks of alcohol    Comment: 03/18/2014 "no alacohol since 1996"   Drug use: No   Sexual activity: Never  Other Topics Concern   Not on file  Social History Narrative   Married since 09/18/1964.    1 son Casimiro Needle. No grandchildren.   Social Determinants of Health   Financial Resource Strain: Low Risk  (08/08/2021)   Overall Financial Resource Strain (CARDIA)    Difficulty of Paying Living Expenses: Not hard at all  Food Insecurity: No Food Insecurity (08/08/2021)   Hunger Vital Sign    Worried About Running Out of Food in the Last Year: Never true    Ran Out of Food in the Last Year: Never true  Transportation Needs: No Transportation Needs (08/08/2021)   PRAPARE - Administrator, Civil Service (Medical): No     Lack of Transportation (Non-Medical): No  Physical Activity: Insufficiently Active (08/08/2021)   Exercise Vital Sign    Days of Exercise per Week: 3 days    Minutes of Exercise per Session: 20 min  Stress: No Stress Concern Present (08/08/2021)   [employer] of Occupational Health - Occupational Stress Questionnaire    Feeling of Stress : Not at all  Social Connections: Socially Integrated (08/08/2021)   Social Connection and Isolation Panel [NHANES]    Frequency of Communication with Friends and Family: More than three times a week    Frequency of Social Gatherings with Friends and Family: Three times a week    Attends Religious Services: More than 4 times per year    Active Member of Clubs or Organizations: Yes    Attends Banker Meetings: More than 4 times per year    Marital Status: Married  Catering manager Violence: Not At Risk (08/08/2021)   Humiliation, Afraid, Rape, and Kick questionnaire    Fear of Current or Ex-Partner: No    Emotionally Abused: No    Physically Abused: No    Sexually Abused: No      This chart was dictated using voice recognition software.  Despite best efforts to proofread,  errors can occur which can change the documentation meaning.         Final Clinical Impression(s) / ED Diagnoses Final diagnoses:  COPD exacerbation (HCC)  Acute cough    Rx / DC Orders ED Discharge Orders          Ordered    doxycycline (VIBRAMYCIN) 100 MG capsule  2 times daily        02/18/22 1802    predniSONE (DELTASONE) 20 MG tablet  Daily        02/18/22 1802    benzonatate (TESSALON) 100 MG capsule  3 times daily PRN,   Status:  Discontinued        02/18/22 1803    benzonatate (TESSALON) 100 MG capsule  3 times daily PRN        02/18/22 1803              Sloan Leiter, DO 02/18/22 1805    Sloan Leiter, DO 02/18/22 1807

## 2022-02-19 ENCOUNTER — Telehealth: Payer: Self-pay | Admitting: Pulmonary Disease

## 2022-02-19 NOTE — Telephone Encounter (Signed)
Called and notified patient. He voiced understanding and states he will cal after completing medications if not doing better.

## 2022-02-20 ENCOUNTER — Telehealth: Payer: Self-pay | Admitting: Medical Oncology

## 2022-03-01 ENCOUNTER — Telehealth: Payer: Self-pay | Admitting: Pulmonary Disease

## 2022-03-01 NOTE — Telephone Encounter (Signed)
Called and spoke to patient. He states he has not improved at all since going to the ER in June. I did advise patient that Dr. Elsworth Soho is off until Monday but I could send the DOD a message regarding patient being sick. Patient declines and would only like sick message to be addressed by Dr. Elsworth Soho. Scheduled patient for Tues in [place] office 7/11 at 3:00 pm. (Earliest available appt for Dr.Alva). Patient states he will call if he gets worse in the meantime or if he has to go to the ER. Otherwise, nothing further needed. Dr. Elsworth Soho routing to you as an Juluis Rainier so you are aware patient is sick but is coming in on Tuesday 7/11.

## 2022-03-01 NOTE — Telephone Encounter (Signed)
ATC patient.  LMTCB. 

## 2022-03-06 ENCOUNTER — Ambulatory Visit (INDEPENDENT_AMBULATORY_CARE_PROVIDER_SITE_OTHER): Payer: Medicare Other | Admitting: Pulmonary Disease

## 2022-03-06 ENCOUNTER — Encounter: Payer: Self-pay | Admitting: Pulmonary Disease

## 2022-03-06 DIAGNOSIS — J449 Chronic obstructive pulmonary disease, unspecified: Secondary | ICD-10-CM

## 2022-03-06 DIAGNOSIS — C3411 Malignant neoplasm of upper lobe, right bronchus or lung: Secondary | ICD-10-CM

## 2022-03-06 MED ORDER — NYSTATIN 100000 UNIT/ML MT SUSP
5.0000 mL | Freq: Four times a day (QID) | OROMUCOSAL | 1 refills | Status: DC
Start: 2022-03-06 — End: 2022-06-29

## 2022-03-06 MED ORDER — LEVOFLOXACIN 500 MG PO TABS: 500.0000 mg | ORAL_TABLET | Freq: Every day | ORAL | 0 refills | Status: DC

## 2022-03-06 MED ORDER — AMOXICILLIN-POT CLAVULANATE 875-125 MG PO TABS: 1.0000 | ORAL_TABLET | Freq: Two times a day (BID) | ORAL | 0 refills | Status: DC

## 2022-03-06 NOTE — Assessment & Plan Note (Signed)
Chest x-ray dependently reviewed which shows unchanged right upper lobe opacity based on previous radiation. Await CT chest next week

## 2022-03-06 NOTE — Progress Notes (Signed)
   Subjective:    Patient ID: Paul Sandoval., male    DOB: 01-25-1941, 81 y.o.   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  He presented coughing, shortness of breath and heavy phlegm, he was treated with doxycycline and a course of prednisone.  Normally he feels better within 7 to 10 days of treatment but now it has been 2 to 3 weeks and he is still coughing, mostly gray sputum but occasionally green to yellow  His skin rash is disappeared after switching to [facility] but he wonders if he has thrush again.  Nystatin has worked well for him in the past  He is due for oncology follow-up and CT chest next week    Significant tests/ events reviewed  08/2021 CT chest wo con >> new area of groundglass left upper lobe   02/2021 CT chest postirradiation appearance with dense fibrotic consolidation and volume loss right upper lobe, loculated right small effusion with pleural  calcification Enlarged subcarinal lymph node stable for more than 2 years     04/09/2018-echocardiogram-LV ejection fraction 25 to 30%, diffuse hypokinesis,   02/01/2016-pulmonary function test- FVC 1.96 (45% predicted), postbronchodilator ratio of 67, postbronchodilator FEV1 1.64 (53% predicted, postbronchodilator response, significant mid flow reversibility, DLCO 58  Review of Systems  neg for any significant sore throat, dysphagia, itching, sneezing, nasal  congestion or excess/ purulent secretions, fever, chills, sweats, unintended wt loss, pleuritic or exertional cp, hempoptysis, orthopnea pnd or change in chronic leg swelling. Also denies presyncope, palpitations, heartburn, abdominal pain, nausea, vomiting, diarrhea or change in bowel or urinary habits, dysuria,hematuria, rash, arthralgias, visual complaints, headache, numbness weakness or ataxia.     Objective:   Physical Exam  Gen. Pleasant, elderly,well-nourished, in no distress ENT - no thrush, no pallor/icterus,no post nasal drip Neck: No JVD, no thyromegaly, no carotid bruits Lungs: no use of accessory muscles, no dullness to percussion, Rt basal rales no rhonchi  Cardiovascular: Rhythm regular, heart sounds  normal, no murmurs or gallops, no peripheral edema Musculoskeletal: No deformities, no cyanosis or clubbing          Assessment & Plan:

## 2022-03-06 NOTE — Patient Instructions (Signed)
   X Rx for Augmentin 875 bid  x 7 days   OTC mucinex 600 mg twice daily x 7 days  Flutter valve  Await CT chest next week

## 2022-03-06 NOTE — Assessment & Plan Note (Addendum)
He seems to be having mucopurulent exacerbation of bronchitis.  He has had resistant bugs including Serratia and Pseudomonas in the past.  My concern would be that doxycycline did not adequately treat these organisms. I will send in a prescription for Levaquin 500 milligrams daily for 7 days.  He will continue on Wixela and we will provide him with a prescription for nystatin should he develop thrush.  I did not see any evidence of thrush on exam today

## 2022-03-07 ENCOUNTER — Telehealth: Payer: Self-pay | Admitting: Medical Oncology

## 2022-03-07 NOTE — Telephone Encounter (Signed)
I told pt he can eat and drink and take meds the day of hs scan.There are no restrictions.

## 2022-03-12 ENCOUNTER — Other Ambulatory Visit: Payer: Self-pay

## 2022-03-12 ENCOUNTER — Telehealth: Payer: Self-pay | Admitting: Family Medicine

## 2022-03-12 ENCOUNTER — Encounter (HOSPITAL_COMMUNITY): Payer: Self-pay

## 2022-03-12 ENCOUNTER — Inpatient Hospital Stay: Payer: Medicare Other | Attending: Internal Medicine

## 2022-03-12 ENCOUNTER — Ambulatory Visit (HOSPITAL_COMMUNITY)
Admission: RE | Admit: 2022-03-12 | Discharge: 2022-03-12 | Disposition: A | Discharge status: Home / Self Care | Payer: Medicare Other | Source: Ambulatory Visit | Attending: Internal Medicine | Admitting: Internal Medicine

## 2022-03-12 DIAGNOSIS — R0609 Other forms of dyspnea: Secondary | ICD-10-CM | POA: Insufficient documentation

## 2022-03-12 DIAGNOSIS — K219 Gastro-esophageal reflux disease without esophagitis: Secondary | ICD-10-CM | POA: Insufficient documentation

## 2022-03-12 DIAGNOSIS — Z7902 Long term (current) use of antithrombotics/antiplatelets: Secondary | ICD-10-CM | POA: Insufficient documentation

## 2022-03-12 DIAGNOSIS — C3490 Malignant neoplasm of unspecified part of unspecified bronchus or lung: Secondary | ICD-10-CM

## 2022-03-12 DIAGNOSIS — I10 Essential (primary) hypertension: Secondary | ICD-10-CM | POA: Insufficient documentation

## 2022-03-12 DIAGNOSIS — I252 Old myocardial infarction: Secondary | ICD-10-CM | POA: Insufficient documentation

## 2022-03-12 DIAGNOSIS — J449 Chronic obstructive pulmonary disease, unspecified: Secondary | ICD-10-CM | POA: Insufficient documentation

## 2022-03-12 DIAGNOSIS — Z9049 Acquired absence of other specified parts of digestive tract: Secondary | ICD-10-CM | POA: Insufficient documentation

## 2022-03-12 DIAGNOSIS — Z8719 Personal history of other diseases of the digestive system: Secondary | ICD-10-CM | POA: Insufficient documentation

## 2022-03-12 DIAGNOSIS — E785 Hyperlipidemia, unspecified: Secondary | ICD-10-CM | POA: Insufficient documentation

## 2022-03-12 DIAGNOSIS — R5383 Other fatigue: Secondary | ICD-10-CM | POA: Insufficient documentation

## 2022-03-12 DIAGNOSIS — Z79899 Other long term (current) drug therapy: Secondary | ICD-10-CM | POA: Insufficient documentation

## 2022-03-12 DIAGNOSIS — C3411 Malignant neoplasm of upper lobe, right bronchus or lung: Secondary | ICD-10-CM | POA: Insufficient documentation

## 2022-03-12 DIAGNOSIS — R058 Other specified cough: Secondary | ICD-10-CM | POA: Insufficient documentation

## 2022-03-12 LAB — CBC WITH DIFFERENTIAL (CANCER CENTER ONLY)
Abs Immature Granulocytes: 0.02 10*3/uL (ref 0.00–0.07)
Basophils Absolute: 0 10*3/uL (ref 0.0–0.1)
Basophils Relative: 0 %
Eosinophils Absolute: 0 10*3/uL (ref 0.0–0.5)
Eosinophils Relative: 0 %
HCT: 36.9 % — ABNORMAL LOW (ref 39.0–52.0)
Hemoglobin: 11.7 g/dL — ABNORMAL LOW (ref 13.0–17.0)
Immature Granulocytes: 0 %
Lymphocytes Relative: 14 %
Lymphs Abs: 0.8 10*3/uL (ref 0.7–4.0)
MCH: 27.5 pg (ref 26.0–34.0)
MCHC: 31.7 g/dL (ref 30.0–36.0)
MCV: 86.8 fL (ref 80.0–100.0)
Monocytes Absolute: 0.6 10*3/uL (ref 0.1–1.0)
Monocytes Relative: 9 %
Neutro Abs: 4.5 10*3/uL (ref 1.7–7.7)
Neutrophils Relative %: 77 %
Platelet Count: 123 10*3/uL — ABNORMAL LOW (ref 150–400)
RBC: 4.25 MIL/uL (ref 4.22–5.81)
RDW: 15.6 % — ABNORMAL HIGH (ref 11.5–15.5)
WBC Count: 6 10*3/uL (ref 4.0–10.5)
nRBC: 0 % (ref 0.0–0.2)

## 2022-03-12 LAB — CMP (CANCER CENTER ONLY)
ALT: 7 U/L (ref 0–44)
AST: 12 U/L — ABNORMAL LOW (ref 15–41)
Albumin: 3.7 g/dL (ref 3.5–5.0)
Alkaline Phosphatase: 66 U/L (ref 38–126)
Anion gap: 5 (ref 5–15)
BUN: 19 mg/dL (ref 8–23)
CO2: 32 mmol/L (ref 22–32)
Calcium: 9.1 mg/dL (ref 8.9–10.3)
Chloride: 103 mmol/L (ref 98–111)
Creatinine: 1.75 mg/dL — ABNORMAL HIGH (ref 0.61–1.24)
GFR, Estimated: 38 mL/min — ABNORMAL LOW (ref >60)
Glucose, Bld: 95 mg/dL (ref 70–99)
Potassium: 3.8 mmol/L (ref 3.5–5.1)
Sodium: 140 mmol/L (ref 135–145)
Total Bilirubin: 0.5 mg/dL (ref 0.3–1.2)
Total Protein: 6.8 g/dL (ref 6.5–8.1)

## 2022-03-12 NOTE — Telephone Encounter (Signed)
Patient would like to switch his omeprazole 40 mg back to 20 mg please. [employer]

## 2022-03-14 ENCOUNTER — Other Ambulatory Visit: Payer: Self-pay | Admitting: Family Medicine

## 2022-03-14 MED ORDER — OMEPRAZOLE 20 MG PO CPDR: 20.0000 mg | DELAYED_RELEASE_CAPSULE | Freq: Every day | ORAL | 3 refills | Status: AC

## 2022-03-18 ENCOUNTER — Other Ambulatory Visit (HOSPITAL_COMMUNITY): Payer: Self-pay | Admitting: Cardiology

## 2022-03-19 ENCOUNTER — Other Ambulatory Visit: Payer: Self-pay

## 2022-03-19 ENCOUNTER — Other Ambulatory Visit (HOSPITAL_COMMUNITY): Payer: Self-pay | Admitting: *Deleted

## 2022-03-19 ENCOUNTER — Inpatient Hospital Stay (HOSPITAL_BASED_OUTPATIENT_CLINIC_OR_DEPARTMENT_OTHER): Payer: Medicare Other | Admitting: Internal Medicine

## 2022-03-19 ENCOUNTER — Encounter: Payer: Self-pay | Admitting: Internal Medicine

## 2022-03-19 VITALS — BP 124/96 | HR 80 | Temp 97.7°F | Resp 18 | Ht 70.0 in | Wt 175.0 lb

## 2022-03-19 DIAGNOSIS — Z79899 Other long term (current) drug therapy: Secondary | ICD-10-CM | POA: Diagnosis not present

## 2022-03-19 DIAGNOSIS — J449 Chronic obstructive pulmonary disease, unspecified: Secondary | ICD-10-CM | POA: Diagnosis not present

## 2022-03-19 DIAGNOSIS — Z8719 Personal history of other diseases of the digestive system: Secondary | ICD-10-CM | POA: Diagnosis not present

## 2022-03-19 DIAGNOSIS — C3490 Malignant neoplasm of unspecified part of unspecified bronchus or lung: Secondary | ICD-10-CM | POA: Diagnosis not present

## 2022-03-19 DIAGNOSIS — Z9049 Acquired absence of other specified parts of digestive tract: Secondary | ICD-10-CM | POA: Diagnosis not present

## 2022-03-19 DIAGNOSIS — K219 Gastro-esophageal reflux disease without esophagitis: Secondary | ICD-10-CM | POA: Insufficient documentation

## 2022-03-19 DIAGNOSIS — I252 Old myocardial infarction: Secondary | ICD-10-CM | POA: Diagnosis not present

## 2022-03-19 DIAGNOSIS — Z7902 Long term (current) use of antithrombotics/antiplatelets: Secondary | ICD-10-CM | POA: Insufficient documentation

## 2022-03-19 DIAGNOSIS — R058 Other specified cough: Secondary | ICD-10-CM | POA: Diagnosis not present

## 2022-03-19 DIAGNOSIS — R5383 Other fatigue: Secondary | ICD-10-CM | POA: Insufficient documentation

## 2022-03-19 DIAGNOSIS — C3411 Malignant neoplasm of upper lobe, right bronchus or lung: Secondary | ICD-10-CM

## 2022-03-19 DIAGNOSIS — R0609 Other forms of dyspnea: Secondary | ICD-10-CM | POA: Insufficient documentation

## 2022-03-19 DIAGNOSIS — I10 Essential (primary) hypertension: Secondary | ICD-10-CM | POA: Diagnosis not present

## 2022-03-19 DIAGNOSIS — E785 Hyperlipidemia, unspecified: Secondary | ICD-10-CM | POA: Insufficient documentation

## 2022-03-19 NOTE — Patient Instructions (Signed)
Thank you for choosing [facility] to provide your care.   Should you have questions after your visit to the [facility], [facility]), please contact this office at [phone] between 8:30 AM and 4:30 PM.  Voice mails left after 4:00 PM may not be returned until the following business day.  Calls received after 4:30 PM will be answered by an off-site Nurse Triage Line.    Prescription Refills:  Please have your pharmacy contact us directly for most prescription requests.  Contact the office directly for refills of narcotics (pain medications). Allow 48-72 hours for refills.  Appointments: Please contact the [facility] scheduling department [phone] for questions regarding [facility] appointment scheduling.  Contact the schedulers with any scheduling changes so that your appointment can be rescheduled in a timely manner.   Central Scheduling for [facility] [phone] - Call to schedule PET scan, CT scan, MRI, and Ultrasound.  To afford each patient quality time with our providers, please arrive 30 minutes before your scheduled appointment time.  If you arrive late for your appointment, you may be asked to reschedule.  We strive to give you quality time with our providers, and arriving late affects you and other patients whose appointments are after yours. If you are a no show for multiple scheduled visits, you may be dismissed from the clinic at the providers discretion.     Resources: [employer] [phone] for additional information on assistance programs or assistance connecting with community support programs   [place]  [phone]: Information regarding food stamps, Medicaid, and utility assistance [employer]'s shared-ride transportation service for eligible riders who have a disability that prevents them from riding the fixed route bus.   [place] [phone] Helps people with  Medicare understand their rights and benefits, navigate the Medicare system, and secure the quality healthcare they deserve [employer] ([phone] Assists patients locate various types of support and financial assistance Cancer Care: 1-800-813-HOPE (580)575-7761) Provides financial assistance, online support groups, medication/co-pay assistance.   Transportation Assistance for appointments at [place] or provider support staff for referral to [place]   Again, thank you for choosing [facility] for your care.

## 2022-03-19 NOTE — Addendum Note (Signed)
Addended by: Ardeen Garland on: 03/19/2022 11:05 AM   Modules accepted: Orders

## 2022-03-19 NOTE — Progress Notes (Signed)
[place] [phone]   [phone]  OFFICE PROGRESS NOTE  Coral Spikes, DO [place] [address]  DIAGNOSIS: Stage IIIA (T2b, N2, M0) non-small cell lung cancer, favoring squamous cell carcinoma presented with right upper lobe lung mass in addition to mediastinal lymphadenopathy diagnosed in March 2017.  PRIOR THERAPY:  A course of concurrent chemoradiation with weekly carboplatin for AUC of 2 and paclitaxel 45 MG/M2. Status post 5 cycle with partial response.  CURRENT THERAPY: Observation  INTERVAL HISTORY: Paul Sandoval. 81 y.o. male returns to the clinic today for 6 months follow-up visit.  The patient is feeling fine today with no concerning complaints except for the persistent shortness of breath at baseline increased with exertion with cough productive of thick sputum.  He is followed by Dr. Elsworth Soho for his COPD and other pulmonary issues.  He has no chest pain or hemoptysis.  He has no nausea, vomiting, diarrhea or constipation.  He has no headache or visual changes.  He denied having any significant weight loss or night sweats.  He is here today for evaluation with repeat CT scan of the chest for restaging of his disease.   MEDICAL HISTORY: Past Medical History:  Diagnosis Date   AAA (abdominal aortic aneurysm) ([place]) 2010   4.4 cm 08/2008;4.44 in 7/10 and 4.65 in 08/2009; 4.8 by CT in 11/2009; 4.3 by ultrasound in 08/2010   Anemia    Arteriosclerotic cardiovascular disease (ASCVD) 1996   CABG-1996   Arthritis    "fingers" (03/18/2014)   CAD (coronary artery disease)    03/18/14:  PCI with DES to distal left main. 7/29: DES to the SVG to Diag   Cancer [facility])    Upper right lobe lung cancer   Cardiomyopathy, ischemic    Echo 03/17/14: EF 45-50%   CHF (congestive heart failure) (HCC)    Chronic bronchitis ([place])    Chronic kidney disease    CRF   Chronic rhinitis    Colonic polyp 2002   polypectomy in 2002   COPD  (chronic obstructive pulmonary disease) ([place])    Diverticulosis    Dyspnea    with exertion   ED (erectile dysfunction)    Encounter for antineoplastic chemotherapy 12/19/2015   GERD (gastroesophageal reflux disease)    History of blood transfusion    Hyperlipidemia    Hypertension    IFG (impaired fasting glucose)    Myocardial infarction [facility])    "told h/o silent MI sometime before 1996"   Pneumonia ~ 2001; ~ 2005    has had more than twice   Right bundle branch block    Tobacco abuse, in remission    40 pack year total consumption; discontinued in 1996    ALLERGIES:  is allergic to neomycin, anoro ellipta [umeclidinium-vilanterol], stiolto respimat [tiotropium bromide-olodaterol], and cetirizine & related.  MEDICATIONS:  Current Outpatient Medications  Medication Sig Dispense Refill   acetaminophen (TYLENOL) 500 MG tablet Take 1,000 mg by mouth every 6 (six) hours as needed for fever.     albuterol (VENTOLIN HFA) 108 (90 Base) MCG/ACT inhaler Inhale 2 puffs into the lungs every 6 (six) hours as needed for wheezing or shortness of breath. 18 g 5   benzonatate (TESSALON) 100 MG capsule Take 1 capsule (100 mg total) by mouth 3 (three) times daily as needed for cough. (Patient not taking: Reported on 03/06/2022) 21 capsule 0   carvedilol (COREG) 12.5 MG tablet  TAKE 1 &1/2 TABLET BY MOUTH TWICE DAILY WITH A MEAL 180 tablet 3   clopidogrel (PLAVIX) 75 MG tablet TAKE (1) TABLET BY MOUTH ONCE DAILY. 90 tablet 3   dapagliflozin propanediol (FARXIGA) 10 MG TABS tablet Take 10 mg by mouth daily before breakfast. 30 tablet 11   Dextromethorphan-guaiFENesin (ROBITUSSIN DM PO) Take 20 mLs by mouth daily as needed (for cough).     doxycycline (VIBRAMYCIN) 100 MG capsule Take 1 capsule (100 mg total) by mouth 2 (two) times daily. (Patient not taking: Reported on 03/06/2022) 20 capsule 0   eplerenone (INSPRA) 50 MG tablet Take 1 tablet (50 mg total) by mouth every evening. 30 tablet 11   fluticasone  (FLONASE) 50 MCG/ACT nasal spray Place 1 spray into both nostrils daily as needed for allergies or rhinitis. 16 g 2   fluticasone-salmeterol (WIXELA INHUB) 100-50 MCG/ACT AEPB Inhale 1 puff into the lungs 2 (two) times daily. 60 each 11   furosemide (LASIX) 20 MG tablet Take 20 mg by mouth daily.     ipratropium-albuterol (DUONEB) 0.5-2.5 (3) MG/3ML SOLN Take 3 mLs by nebulization in the morning, at noon, and at bedtime. 360 mL 3   levofloxacin (LEVAQUIN) 500 MG tablet Take 1 tablet (500 mg total) by mouth daily. 7 tablet 0   nitroGLYCERIN (NITROSTAT) 0.4 MG SL tablet PLACE 1 TAB UNDER TONGUE EVERY 5 MIN IF NEEDED FOR CHEST PAIN. MAY USE 3 TIMES.NO RELIEF CALL 911. 25 tablet 1   nystatin (MYCOSTATIN) 100000 UNIT/ML suspension Take 5 mLs (500,000 Units total) by mouth 4 (four) times daily. 120 mL 1   omeprazole (PRILOSEC) 20 MG capsule Take 1 capsule (20 mg total) by mouth daily. 90 capsule 3   rosuvastatin (CRESTOR) 20 MG tablet TAKE ONE TABLET BY MOUTH ONCE DAILY. 90 tablet 0   sacubitril-valsartan (ENTRESTO) 97-103 MG Take 0.5 tablets by mouth 2 (two) times daily.     sodium chloride (OCEAN) 0.65 % SOLN nasal spray Place 1 spray into both nostrils as needed for congestion.     No current facility-administered medications for this visit.    SURGICAL HISTORY:  Past Surgical History:  Procedure Laterality Date   ABDOMINAL AORTIC ANEURYSM REPAIR  11/2012   ABDOMINAL AORTIC ENDOVASCULAR STENT GRAFT N/A 12/11/2012   Procedure: ABDOMINAL AORTIC ENDOVASCULAR STENT GRAFT;  Surgeon: Mal Misty, MD;  Location: [facility];  Service: Vascular;  Laterality: N/A;  Ultrasound guided; [place]  01/08/1995   COLONOSCOPY  2002   polypectomy-patient denies   CORONARY ANGIOPLASTY WITH STENT PLACEMENT  03/18/2014   "1"   CORONARY ANGIOPLASTY WITH STENT PLACEMENT  03/24/2014   "1"   CORONARY ARTERY BYPASS GRAFT  01/09/1995   "CABG X3"   FLEXIBLE BRONCHOSCOPY N/A 03/01/2017   Procedure:  FLEXIBLE BRONCHOSCOPY WITH BIOPSIES;  Surgeon: Gaye Pollack, MD;  Location: [facility];  Service: Thoracic;  Laterality: N/A;   JOINT REPLACEMENT     LAPAROSCOPIC CHOLECYSTECTOMY  12/2009   LEFT AND RIGHT HEART CATHETERIZATION WITH CORONARY/GRAFT ANGIOGRAM N/A 03/18/2014   Procedure: LEFT AND RIGHT HEART CATHETERIZATION WITH Beatrix Fetters;  Surgeon: Blane Ohara, MD;  Location: [facility] CATH LAB;  Service: Cardiovascular;  Laterality: N/A;   PERCUTANEOUS CORONARY STENT INTERVENTION (PCI-S)  03/18/2014   Procedure: PERCUTANEOUS CORONARY STENT INTERVENTION (PCI-S);  Surgeon: Blane Ohara, MD;  Location: [facility] CATH LAB;  Service: Cardiovascular;;   PERCUTANEOUS CORONARY STENT INTERVENTION (PCI-S) N/A 03/24/2014   Procedure: PERCUTANEOUS CORONARY STENT INTERVENTION (PCI-S);  Surgeon: Legrand Como  Ree Kida, MD;  Location: [facility] CATH LAB;  Service: Cardiovascular;  Laterality: N/A;   PLEURAL EFFUSION DRAINAGE Right 03/01/2017   Procedure: DRAINAGE OF PLEURAL EFFUSION;  Surgeon: Gaye Pollack, MD;  Location: [facility] OR;  Service: Thoracic;  Laterality: Right;   RIGHT/LEFT HEART CATH AND CORONARY/GRAFT ANGIOGRAPHY N/A 05/15/2017   Procedure: RIGHT/LEFT HEART CATH AND CORONARY/GRAFT ANGIOGRAPHY;  Surgeon: Larey Dresser, MD;  Location: [facility] CV LAB;  Service: Cardiovascular;  Laterality: N/A;   TALC PLEURODESIS Right 03/01/2017   Procedure: Pietro Cassis;  Surgeon: Gaye Pollack, MD;  Location: [facility];  Service: Thoracic;  Laterality: Right;   TOTAL HIP ARTHROPLASTY Left 01/21/2013   Procedure: TOTAL HIP ARTHROPLASTY ANTERIOR APPROACH;  Surgeon: Mauri Pole, MD;  Location: [facility];  Service: Orthopedics;  Laterality: Left;   VIDEO ASSISTED THORACOSCOPY Right 03/01/2017   Procedure: VIDEO ASSISTED THORACOSCOPY WITH BIOPSIES;  Surgeon: Gaye Pollack, MD;  Location: [facility];  Service: Thoracic;  Laterality: Right;   VIDEO BRONCHOSCOPY N/A 11/17/2015   Procedure: VIDEO BRONCHOSCOPY WITH FLUORO;  Surgeon: Rigoberto Noel, MD;  Location: [facility];  Service: Cardiopulmonary;  Laterality: N/A;    REVIEW OF SYSTEMS:  A comprehensive review of systems was negative except for: Constitutional: positive for fatigue Respiratory: positive for cough and dyspnea on exertion   PHYSICAL EXAMINATION: General appearance: alert, cooperative, fatigued and no distress Head: Normocephalic, without obvious abnormality, atraumatic Neck: no adenopathy, no JVD, supple, symmetrical, trachea midline and thyroid not enlarged, symmetric, no tenderness/mass/nodules Lymph nodes: Cervical, supraclavicular, and axillary nodes normal. Resp: clear to auscultation bilaterally Back: symmetric, no curvature. ROM normal. No CVA tenderness. Cardio: regular rate and rhythm, S1, S2 normal, no murmur, click, rub or gallop GI: soft, non-tender; bowel sounds normal; no masses,  no organomegaly Extremities: extremities normal, atraumatic, no cyanosis or edema Neurologic: Alert and oriented X 3, normal strength and tone. Normal symmetric reflexes. Normal coordination and gait  ECOG PERFORMANCE STATUS: 1 - Symptomatic but completely ambulatory  Blood pressure (!) 124/96, pulse 80, temperature 97.7 F (36.5 C), temperature source Oral, resp. rate 18, height _0  (1.778 m), weight 175 lb (79.4 kg), SpO2 95 %.  LABORATORY DATA: Lab Results  Component Value Date   WBC 6.0 03/12/2022   HGB 11.7 (L) 03/12/2022   HCT 36.9 (L) 03/12/2022   MCV 86.8 03/12/2022   PLT 123 (L) 03/12/2022      Chemistry      Component Value Date/Time   NA 140 03/12/2022 0837   NA 140 05/23/2020 0821   NA 137 05/21/2017 0906   K 3.8 03/12/2022 0837   K 4.1 05/21/2017 0906   CL 103 03/12/2022 0837   CO2 32 03/12/2022 0837   CO2 29 05/21/2017 0906   BUN 19 03/12/2022 0837   BUN 28 (H) 05/23/2020 0821   BUN 14.6 05/21/2017 0906   CREATININE 1.75 (H) 03/12/2022 0837   CREATININE 1.2 05/21/2017 0906      Component Value Date/Time   CALCIUM 9.1  03/12/2022 0837   CALCIUM 9.4 05/21/2017 0906   ALKPHOS 66 03/12/2022 0837   ALKPHOS 86 05/21/2017 0906   AST 12 (L) 03/12/2022 0837   AST 19 05/21/2017 0906   ALT 7 03/12/2022 0837   ALT 14 05/21/2017 0906   BILITOT 0.5 03/12/2022 0837   BILITOT 0.57 05/21/2017 0906       RADIOGRAPHIC STUDIES:    ASSESSMENT AND PLAN:  This is a very pleasant 81 years old white  male with a stage IIIA non-small cell lung cancer status post a course of concurrent chemoradiation with weekly carboplatin and paclitaxel and he had a rough time with the treatment at that time. He did not receive consolidation chemotherapy. The patient has been in observation since 2017 and he is feeling fine with no concerning complaints except for the baseline shortness of breath secondary to COPD and radiation-induced pneumonitis. He had repeat CT scan of the chest performed recently.  I personally and independently reviewed the scans and discussed the result with the patient today. His scan showed no concerning findings for disease recurrence or metastasis. I recommended for him to continue on observation with repeat CT scan of the chest in 1 year. The patient was advised to call immediately if he has any other concerning symptoms in the interval. The patient voices understanding of current disease status and treatment options and is in agreement with the current care plan. All questions were answered. The patient knows to call the clinic with any problems, questions or concerns. We can certainly see the patient much sooner if necessary.   Disclaimer: This note was dictated with voice recognition software. Similar sounding words can inadvertently be transcribed and may not be corrected upon review.

## 2022-03-20 ENCOUNTER — Ambulatory Visit (INDEPENDENT_AMBULATORY_CARE_PROVIDER_SITE_OTHER): Payer: Medicare Other | Admitting: Pulmonary Disease

## 2022-03-20 ENCOUNTER — Encounter: Payer: Self-pay | Admitting: Pulmonary Disease

## 2022-03-20 ENCOUNTER — Telehealth (HOSPITAL_COMMUNITY): Payer: Self-pay | Admitting: Cardiology

## 2022-03-20 DIAGNOSIS — J449 Chronic obstructive pulmonary disease, unspecified: Secondary | ICD-10-CM

## 2022-03-20 DIAGNOSIS — C3411 Malignant neoplasm of upper lobe, right bronchus or lung: Secondary | ICD-10-CM

## 2022-03-20 MED ORDER — SODIUM CHLORIDE 3 % IN NEBU: INHALATION_SOLUTION | Freq: Every day | RESPIRATORY_TRACT | 0 refills | Status: AC

## 2022-03-20 NOTE — Telephone Encounter (Signed)
Pt stated  prior auth  was faxed from [place] in [place] on Sunday for rosubastatin (Crestor) pt is completely out of meds, please advise

## 2022-03-20 NOTE — Assessment & Plan Note (Signed)
Continue Wixela twice daily, rinse mouth after use. He gets thrush frequently and if this recurs he knows to call us for nystatin. He will continue to use DuoNebs thrice daily

## 2022-03-20 NOTE — Progress Notes (Signed)
   Subjective:    Patient ID: Paul Ewing., male    DOB: 05-08-1941, 81 y.o.   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  He presented coughing, shortness of breath and heavy phlegm, he was treated with doxycycline and a course of prednisone.  Normally he feels better within 7 to 10 days of treatment but now it has been 2 to 3 weeks and he is still coughing with mucus Last seen 7/11 >> Rx with levaquin x 7ds due to resistant bugs in the past -including Serratia and Pseudomonas    He continues to have increased amounts of sputum but now this has changed color to white.  Breathing is stable.  He is compliant with Wixela and using DuoNebs thrice daily  We reviewed oncology consultation and CT chest   Significant tests/ events reviewed 02/2022 CT chest without con >> chronic changes right lung , no new infiltrates or lymphadenopathy  08/2021 CT chest wo con >> new area of groundglass left upper lobe   02/2021 CT chest postirradiation appearance with dense fibrotic consolidation and volume loss right upper lobe, loculated right small effusion with pleural  calcification Enlarged subcarinal lymph node stable for more than 2 years     04/09/2018-echocardiogram-LV ejection fraction 25 to 30%, diffuse hypokinesis,   02/01/2016-pulmonary function test- FVC 1.96 (45% predicted), postbronchodilator ratio of 67,  postbronchodilator FEV1 1.64 (53% predicted, postbronchodilator response, significant mid flow reversibility, DLCO 58   Review of Systems neg for any significant sore throat, dysphagia, itching, sneezing, nasal congestion or excess/ purulent secretions, fever, chills, sweats, unintended wt loss, pleuritic or exertional cp, hempoptysis, orthopnea pnd or change in chronic leg swelling. Also denies presyncope, palpitations, heartburn, abdominal pain, nausea, vomiting, diarrhea or change in bowel or urinary habits, dysuria,hematuria, rash, arthralgias, visual complaints, headache, numbness weakness or ataxia.     Objective:   Physical Exam   Gen. Pleasant, well-nourished, in no distress ENT - no thrush, no pallor/icterus,no post nasal drip Neck: No JVD, no thyromegaly, no carotid bruits Lungs: no use of accessory muscles, no dullness to percussion, BL scattered dry  rales no rhonchi  Cardiovascular: Rhythm regular, heart sounds  normal, no murmurs or gallops, no peripheral edema Musculoskeletal: No deformities, no cyanosis or clubbing         Assessment & Plan:

## 2022-03-20 NOTE — Patient Instructions (Signed)
X If sputum changes color, we wills end sputum culture   Use flutter valve & robitussin  X Rx for hypertonic saline once daily x 1 month

## 2022-03-20 NOTE — Assessment & Plan Note (Signed)
No indication of recurrent malignancy on his current CT.  He does have chronic changes in his right lung and resistant bugs including Pseudomonas in the past.  This is probably why he is not clearing infection as well, completed course of Levaquin.  He still has increased amounts of mucus I do not feel he needs more antibiotics since sputum has turned white. I emphasized airway clearance measures today including -Flutter valve -Robitussin -We will provide hypertonic saline nebs to take once daily Sputum changes color, we will repeat sputum culture and consider treating for Pseudomonas

## 2022-03-20 NOTE — Addendum Note (Signed)
Addended by: Fritzi Mandes D on: 03/20/2022 10:59 AM   Modules accepted: Orders

## 2022-03-21 ENCOUNTER — Other Ambulatory Visit (HOSPITAL_COMMUNITY): Payer: Self-pay

## 2022-03-21 NOTE — Telephone Encounter (Signed)
No PA needed, the patient needed a refill. Refill was sent and picked up 07/25, confirmed with pharmacy.  Charlann Boxer, CPhT

## 2022-03-28 ENCOUNTER — Ambulatory Visit (HOSPITAL_BASED_OUTPATIENT_CLINIC_OR_DEPARTMENT_OTHER)
Admission: RE | Admit: 2022-03-28 | Discharge: 2022-03-28 | Disposition: A | Discharge status: Home / Self Care | Payer: Medicare Other | Source: Ambulatory Visit | Attending: Cardiology | Admitting: Cardiology

## 2022-03-28 ENCOUNTER — Encounter (HOSPITAL_COMMUNITY): Payer: Self-pay | Admitting: Cardiology

## 2022-03-28 ENCOUNTER — Ambulatory Visit (HOSPITAL_COMMUNITY)
Admission: RE | Admit: 2022-03-28 | Discharge: 2022-03-28 | Disposition: A | Discharge status: Home / Self Care | Payer: Medicare Other | Source: Ambulatory Visit | Attending: Cardiology | Admitting: Cardiology

## 2022-03-28 VITALS — BP 120/60 | HR 77 | Wt 173.8 lb

## 2022-03-28 DIAGNOSIS — I5042 Chronic combined systolic (congestive) and diastolic (congestive) heart failure: Secondary | ICD-10-CM | POA: Diagnosis not present

## 2022-03-28 DIAGNOSIS — I25810 Atherosclerosis of coronary artery bypass graft(s) without angina pectoris: Secondary | ICD-10-CM | POA: Insufficient documentation

## 2022-03-28 DIAGNOSIS — N183 Chronic kidney disease, stage 3 unspecified: Secondary | ICD-10-CM | POA: Insufficient documentation

## 2022-03-28 DIAGNOSIS — J441 Chronic obstructive pulmonary disease with (acute) exacerbation: Secondary | ICD-10-CM | POA: Diagnosis not present

## 2022-03-28 DIAGNOSIS — I2510 Atherosclerotic heart disease of native coronary artery without angina pectoris: Secondary | ICD-10-CM | POA: Insufficient documentation

## 2022-03-28 DIAGNOSIS — I5022 Chronic systolic (congestive) heart failure: Secondary | ICD-10-CM | POA: Insufficient documentation

## 2022-03-28 LAB — ECHOCARDIOGRAM COMPLETE
AR max vel: 3.51 cm2
AV Peak grad: 3.5 mmHg
Ao pk vel: 0.93 m/s
Area-P 1/2: 3.83 cm2
S' Lateral: 4.3 cm

## 2022-03-28 MED ORDER — PERFLUTREN LIPID MICROSPHERE
1.0000 mL | INTRAVENOUS | Status: DC | PRN
  Administered 2022-03-28: 4 mL via INTRAVENOUS

## 2022-03-28 NOTE — Progress Notes (Signed)
PCP: Dr. Wolfgang Phoenix HF Cardiology: Dr. Aundra Dubin  81 y.o. with history of COPD, CAD s/p CABG in 1996 and PCI to dLM and SVG-D in 7/15, ischemic cardiomyopathy, and non-small cell lung cancer presents for followup of CHF and dyspnea.  He was initially referred to [place] by Dr. Lenna Gilford after fall in EF and worsening dyspnea was noted.   Patient reports that his initial symptoms prior to CABG in 1996 were dyspnea.  He has never had any worrisome chest pain.  He did well initially after CABG and was golfing regularly and walking 2-3 miles/day up until around 3/17.  He had one episode of increased dyspnea in 2015 and had cath with PCI to distal LM and SVG-D.  In 3/17, he was diagnosed with NSCLC and was treated with radiation and chemotherapy.  Since then, he has had progressive exertional dyspnea.  He was noted on echo in 7/18 to have EF down to 20-25%.    RHC/LHC was done in 9/18, showing occluded SVG-small D, patent LIMA-LAD and SVG-PDA, 50% shelf-like stenosis distal left main (no intervention), filling pressure not elevated and cardiac output preserved.   Echo in 8/19 showed that EF remains 30-35%.  Echo in 2/21 showed EF 35%, mildly decreased RV systolic function.   Admitted 6/43/32 with A/C Systolic Heart Failure exacerbation. Diuresed with IV lasix and transitioned to Lasix 40 mg daily.  Echo in 5/22 showed stable EF 30-35%.   He had a COPD exacerbation in 11/22.    Follow up 3/23, he was not volume overloaded and continued with NYHA II-III symptoms. Felt COPD primary limiter.  Today he returns for HF follow up. Overall feeling fine. He has had a couple of COPD exacerbations over the past couple of months, recently completed abx and steroid. He continues to follow with Dr. Elsworth Soho. Continues to struggle with cough and mucous production. Generally not very active, but no SOB walking on flat ground or with ADLs. Occasional ankle swelling that resolves by morning. Denies palpitations, CP, dizziness, edema, or  PND/Orthopnea. Appetite ok. No fever or chills. Weight at home 170 pounds. Taking all medications.   Echo today (03/28/22) EF 30-35%, grade I DD, RV mildly reduced.   Labs (9/17): LDL 90 Labs (7/18): K 3.5, creatinine 1.28, hgb 10.7 Labs (8/18): LDL 68 Labs (9/18): K 4.1, creatinine 1.2, hgb 11.1, plts 98 K Labs (10/18): K 4.1, creatinine 1.5 Labs (1/19): K 4.3, creatinine 1.54, LDL 80, HDL 49, LFTs normal, TSH normal Labs (4/19): K 4.3, creatinine 1.52, hgb 11.8 Labs (7/19): K 4.1, creatinine 1.5, LDL 75, LFTs normal, hgb 12.2 Labs (10/19): K 4.3, creatinine 1.53 Labs (12/19): K 4.1, creatinine 1.49 Labs (10/20): K 4.4, creatinine 1.67  Labs (1/21): LDL 66 Labs (12/20): K 4.1, creatinine 1.7 Labs (4/21): K 4.1, creatinine 1.71 Labs (5/21): K 4.5, creatinine 1.81 Labs (9/21): K 4.1, creatinine 1.88, LDL 59, HDL 41 Labs (4/22): K 4.4, creatinine 2.32 => 2.21, hgb 9.7 Labs (7/22): K 4.5, creatinine 1.76 Labs (11/22): K 4.4, creatinine 1.72, BNP 785, hgb 9.8, LDL 57, TGs 129 Labs (2/23): K 4.7, creatinine 1.69 Labs (7/23): K 3.8, creatinine 1.75  PMH: 1. COPD: PFTs (6/17) with severe obstructive airways disease.  2. AAA: s/p repair with stent graft.  3. Non-small cell lung cancer: Stage IIIA, diagnosed 3/17.  He had radiation as well as chemotherapy with carboplatin and paclitaxel.   4. CKD stage 3 5. PNA in 6/17 6. Recurrent right pleural effusion: VATS with talc pleurodesis on right.  No malignant cells noted.  7. HTN 8. H/o THR 9. CAD: CABG 1996 with SVG-D, SVG-PDA, LIMA-LAD.   - LHC (7/15): Totally occluded RCA and LAD.  Severe distal LM stenosis.  Patent SVG-PDA, patent LIMA-LAD.  Severe disease SVG-D.  Patient had DES to distal left main and staged PCI of SVG-D.   - Cardiolite (7/17): EF 30%, prior infarction with no ischemia.  - LHC (9/18): occluded SVG-small D, patent LIMA-LAD and SVG-PDA, 50% shelf-like stenosis distal left main (no intervention).  10. Chronic systolic  CHF: Ischemic cardiomyopathy.   - Echo (6/17): EF 30-35%. - Echo (7/18): EF 20-25%, moderate RV dilation with mildly decreased RV systolic function.  - RHC (9/18): mean RA 3, PA 28/9 mean 18, mean PCWP 10, CI 4.05.  - Echo (8/19): EF 30-35%, mildly decreased RV systolic function.   - Echo (2/21): EF 35%, mildly decreased RV systolic function - Echo (5/22): EF 30-35%  - Echo (8/23): EF 30-35%, grade I DD, RV mildly reduced. 11. Carotid stenosis: Carotid dopplers (7/20) with 40-59% BICA stenosis.  - Carotid dopplers (7/21): 40-59% BICA stenosis.  - Carotid dopplers (7/22): 40-59% BICA stenosis.   Social History   Socioeconomic History   Marital status: Married    Spouse name: Ann   Number of children: 1   Years of education: Not on file   Highest education level: Not on file  Occupational History   Occupation: Retired    Comment: [employer]  Tobacco Use   Smoking status: Former    Packs/day: 1.50    Years: 30.00    Total pack years: 45.00    Types: Cigarettes    Start date: 12/01/1956    Quit date: 01/08/1995    Years since quitting: 27.2   Smokeless tobacco: Never  Vaping Use   Vaping Use: Never used  Substance and Sexual Activity   Alcohol use: No    Alcohol/week: 0.0 standard drinks of alcohol    Comment: 03/18/2014 "no alacohol since 1996"   Drug use: No   Sexual activity: Never  Other Topics Concern   Not on file  Social History Narrative   Married since 09/18/1964.    1 son Legrand [place]. No grandchildren.   Social Determinants of Health   Financial Resource Strain: Low Risk  (08/08/2021)   Overall Financial Resource Strain (CARDIA)    Difficulty of Paying Living Expenses: Not hard at all  Food Insecurity: No Food Insecurity (08/08/2021)   Hunger Vital Sign    Worried About Running Out of Food in the Last Year: Never true    Ran Out of Food in the Last Year: Never true  Transportation Needs: No Transportation Needs (08/08/2021)   PRAPARE - Armed forces logistics/support/administrative officer (Medical): No    Lack of Transportation (Non-Medical): No  Physical Activity: Insufficiently Active (08/08/2021)   Exercise Vital Sign    Days of Exercise per Week: 3 days    Minutes of Exercise per Session: 20 min  Stress: No Stress Concern Present (08/08/2021)   [place]    Feeling of Stress : Not at all  Social Connections: [place] (08/08/2021)   Social Connection and Isolation Panel [NHANES]    Frequency of Communication with Friends and Family: More than three times a week    Frequency of Social Gatherings with Friends and Family: Three times a week    Attends Religious Services: More than 4 times per year  Active Member of Clubs or Organizations: Yes    Attends Archivist Meetings: More than 4 times per year    Marital Status: Married  Human resources officer Violence: Not At Risk (08/08/2021)   Humiliation, Afraid, Rape, and Kick questionnaire    Fear of Current or Ex-Partner: No    Emotionally Abused: No    Physically Abused: No    Sexually Abused: No   Family History  Problem Relation Age of Onset   Heart disease Father    Cancer Father        Lung   Arthritis Mother    Parkinsonism Mother    Arthritis Sister        Brother with rheumatoid arthritis   Hypertension Brother    Colon cancer Neg Hx    Colon polyps Neg Hx    ROS: All systems reviewed and negative except as per HPI.   Current Outpatient Medications  Medication Sig Dispense Refill   acetaminophen (TYLENOL) 500 MG tablet Take 1,000 mg by mouth every 6 (six) hours as needed for fever.     albuterol (VENTOLIN HFA) 108 (90 Base) MCG/ACT inhaler Inhale 2 puffs into the lungs every 6 (six) hours as needed for wheezing or shortness of breath. 18 g 5   carvedilol (COREG) 12.5 MG tablet TAKE 1 &1/2 TABLET BY MOUTH TWICE DAILY WITH A MEAL 180 tablet 3   clopidogrel (PLAVIX) 75 MG tablet TAKE (1) TABLET  BY MOUTH ONCE DAILY. 90 tablet 3   dapagliflozin propanediol (FARXIGA) 10 MG TABS tablet Take 10 mg by mouth daily before breakfast. 30 tablet 11   Dextromethorphan-guaiFENesin (ROBITUSSIN DM PO) Take 20 mLs by mouth daily as needed (for cough).     eplerenone (INSPRA) 50 MG tablet Take 1 tablet (50 mg total) by mouth every evening. 30 tablet 11   fluticasone (FLONASE) 50 MCG/ACT nasal spray Place 1 spray into both nostrils daily as needed for allergies or rhinitis. 16 g 2   fluticasone-salmeterol (WIXELA INHUB) 100-50 MCG/ACT AEPB Inhale 1 puff into the lungs 2 (two) times daily. 60 each 11   furosemide (LASIX) 20 MG tablet Take 20 mg by mouth daily.     ipratropium-albuterol (DUONEB) 0.5-2.5 (3) MG/3ML SOLN Take 3 mLs by nebulization in the morning, at noon, and at bedtime. 360 mL 3   nitroGLYCERIN (NITROSTAT) 0.4 MG SL tablet PLACE 1 TAB UNDER TONGUE EVERY 5 MIN IF NEEDED FOR CHEST PAIN. MAY USE 3 TIMES.NO RELIEF CALL 911. 25 tablet 1   omeprazole (PRILOSEC) 20 MG capsule Take 1 capsule (20 mg total) by mouth daily. 90 capsule 3   rosuvastatin (CRESTOR) 20 MG tablet TAKE ONE TABLET BY MOUTH ONCE DAILY. 90 tablet 3   sacubitril-valsartan (ENTRESTO) 97-103 MG Take 0.5 tablets by mouth 2 (two) times daily.     sodium chloride (OCEAN) 0.65 % SOLN nasal spray Place 1 spray into both nostrils as needed for congestion.     sodium chloride HYPERTONIC 3 % nebulizer solution Take by nebulization daily. 750 mL 0   nystatin (MYCOSTATIN) 100000 UNIT/ML suspension Take 5 mLs (500,000 Units total) by mouth 4 (four) times daily. (Patient not taking: Reported on 03/20/2022) 120 mL 1   No current facility-administered medications for this encounter.   Facility-Administered Medications Ordered in Other Encounters  Medication Dose Route Frequency Provider Last Rate Last Admin   perflutren lipid microspheres (DEFINITY) IV suspension  1-10 mL Intravenous PRN Larey Dresser, MD   4 mL at 03/28/22 1133  BP 120/60    Pulse 77   Wt 78.8 kg (173 lb 12.8 oz)   SpO2 95%   BMI 24.94 kg/m    Wt Readings from Last 3 Encounters:  03/28/22 78.8 kg (173 lb 12.8 oz)  03/20/22 79.5 kg (175 lb 3.2 oz)  03/19/22 79.4 kg (175 lb)   Physical Exam: General:  NAD. No resp difficulty HEENT: Hoarse Neck: Supple. No JVD. Carotids 2+ bilat; no bruits. No lymphadenopathy or thryomegaly appreciated. Cor: PMI nondisplaced. Regular rate & rhythm. No rubs, gallops or murmurs. Lungs: Coarse BS Abdomen: Soft, nontender, nondistended. No hepatosplenomegaly. No bruits or masses. Good bowel sounds. Extremities: No cyanosis, clubbing, rash, edema Neuro: Alert & oriented x 3, cranial nerves grossly intact. Moves all 4 extremities w/o difficulty. Affect pleasant.  Assessment/Plan: 1. Chronic systolic CHF: Ischemic cardiomyopathy.  Echo in 7/18 with EF 20-25%, echo 8/19 with EF 30-35%.  Echo in 2/21 showed EF 35%.  Echo 12/2020 EF 30-35%. EF persistently low.  He has a RBBB so not good CRT candidate.  He has not been interested in getting an ICD, think this is reasonable given age and lung cancer history. Echo today (03/28/22) showed EF stable at 30-35%.  NYHA class II-III symptoms.  I think that symptoms are primarily due to COPD rather than CHF, not volume overloaded on exam.   - Continue Lasix 20 mg daily. Recent labs stable.  - Continue Entresto 49/51 bid.  - Continue Coreg 18.75 mg bid.  - Continue eplerenone 50 mg daily.   - Continue dapagliflozin 10 mg daily. 2. CAD: s/p CABG then PCI 7/15 to distal LM and SVG-D.  Anginal equivalent in the past appears to have been dyspnea, he has never had significant chest pain.  LHC (9/18) showed patent LIMA-LAD and SVG-PDA; occluded SVG-D; 50% shelf-like LM stenosis.  - Continue Plavix. - Continue Crestor, good LDL in 11/22.    3. COPD: Severe COPD by PFTs in 6/17.  Based on the last RHC/LHC, suspect that COPD plays a role in his ongoing dyspnea.  4. Hyperlipidemia: Good lipids in 11/22  on Crestor.  5. NSCLC: Follows with Dr. Earlie Server.  He has finished chemo/radiation. Cancer appeared quiescent on most recent scans with Dr. Earlie Server.   6. Right pleural effusion: This has been chronic. S/p VATS.  7. Carotid stenosis: Due for repeat dopplers. Will arrange today.  Followed at [facility].  8. CKD stage 3: Most recent creatinine stable at 1.75  Follow up in 4 months.  Maricela Bo Milford FNP-BC 03/28/2022  Patient seen with NP, agree with the above note.   Echo was done today and reviewed, showing EF stable at 30-35%, mildly decreased RV function.    Main issue for him recently has been COPD exacerbations. Breathing currently is stable but he has had several courses of steroids/antibiotics in the last few months.   General: NAD Neck: No JVD, no thyromegaly or thyroid nodule.  Lungs: Distant BS.  CV: Nondisplaced PMI.  Heart regular S1/S2, no S3/S4, no murmur.  No peripheral edema.  No carotid bruit.  Normal pedal pulses.  Abdomen: Soft, nontender, no hepatosplenomegaly, no distention.  Skin: Intact without lesions or rashes.  Neurologic: Alert and oriented x 3.  Psych: Normal affect. Extremities: No clubbing or cyanosis.  HEENT: Normal.   I am not going to change his cardiac meds today.  He is on a good regimen for CHF and looks euvolemic.  I think that COPD is the main cause of his symptoms currently.  Recent labs showed stable creatinine 1.75.  We will arrange for carotid dopplers (due) and followup in 4 months.   Loralie Champagne 03/29/2022

## 2022-03-28 NOTE — Patient Instructions (Signed)
There has been no changes to your medications.  Your provider wants to have carotid dopplers. You will be called to schedule the appointment.  Your physician recommends that you schedule a follow-up appointment in: 4 months (December 2023)  **please call the office in October to arrange your follow up appointment.  If you have any questions or concerns before your next appointment please send [place] a message through [place] or call our office at [phone].    TO LEAVE A MESSAGE FOR THE NURSE SELECT OPTION 2, PLEASE LEAVE A MESSAGE INCLUDING: YOUR NAME DATE OF BIRTH CALL BACK NUMBER REASON FOR CALL**this is important as we prioritize the call backs  YOU WILL RECEIVE A CALL BACK THE SAME DAY AS LONG AS YOU CALL BEFORE 4:00 PM]  At the [facility], you and your health needs are our priority. As part of our continuing mission to provide you with exceptional heart care, we have created designated Provider Care Teams. These Care Teams include your primary Cardiologist (physician) and Advanced Practice Providers (APPs- Physician Assistants and Nurse Practitioners) who all work together to provide you with the care you need, when you need it.   You may see any of the following providers on your designated Care Team at your next follow up: Dr Glori Bickers Dr Haynes Kerns, NP Lyda Jester, [place] [facility] [place], [place] Audry Riles, PharmD   Please be sure to bring in all your medications bottles to every appointment.

## 2022-04-02 ENCOUNTER — Other Ambulatory Visit (HOSPITAL_COMMUNITY): Payer: Self-pay | Admitting: Cardiology

## 2022-04-02 DIAGNOSIS — I6523 Occlusion and stenosis of bilateral carotid arteries: Secondary | ICD-10-CM

## 2022-04-09 ENCOUNTER — Ambulatory Visit (HOSPITAL_COMMUNITY)
Admission: RE | Admit: 2022-04-09 | Discharge: 2022-04-09 | Disposition: A | Discharge status: Home / Self Care | Payer: Medicare Other | Source: Ambulatory Visit | Attending: Cardiology | Admitting: Cardiology

## 2022-04-09 DIAGNOSIS — I6523 Occlusion and stenosis of bilateral carotid arteries: Secondary | ICD-10-CM | POA: Insufficient documentation

## 2022-04-13 ENCOUNTER — Telehealth (HOSPITAL_COMMUNITY): Payer: Self-pay

## 2022-04-13 NOTE — Telephone Encounter (Addendum)
Pt aware, agreeable, and verbalized understanding   ----- Message from Larey Dresser, MD sent at 04/09/2022  1:29 PM EDT ----- 40-59% BICA stenosis, repeat in 1 year.

## 2022-04-25 ENCOUNTER — Telehealth: Payer: Self-pay

## 2022-04-25 DIAGNOSIS — J449 Chronic obstructive pulmonary disease, unspecified: Secondary | ICD-10-CM

## 2022-04-25 DIAGNOSIS — R058 Other specified cough: Secondary | ICD-10-CM

## 2022-04-25 NOTE — Telephone Encounter (Signed)
Patient brought In sputum sample to [facility] office. He states that at [facility] Dr. Elsworth Soho said to bring in the sample if he started coughing again.   Lov note:  "X If sputum changes color, we wills end sputum culture"  Order placed and given to [place].

## 2022-05-01 LAB — GRAM STAIN W/SPUTUM CULT RFLX

## 2022-05-01 LAB — SPUTUM CULTURE

## 2022-05-21 ENCOUNTER — Telehealth: Payer: Self-pay | Admitting: Pulmonary Disease

## 2022-05-21 NOTE — Telephone Encounter (Signed)
Called and notified patient of Dr. Angus Palms response and he voiced understanding. Nothing further needed

## 2022-05-21 NOTE — Telephone Encounter (Signed)
Patient states Dr. Elsworth Soho knows his case and wants to know if Dr. Elsworth Soho recommends the COVID and flu vaccine for patient and if so, which one?  Please advise- call patient back at [phone].

## 2022-05-21 NOTE — Telephone Encounter (Signed)
Patient states Dr. Elsworth Soho knows his case and wants to know if Dr. Elsworth Soho recommends the COVID and flu vaccine for patient and if so, which one?   Please advise Dr. Elsworth Soho, patient wants recommendation specifically from you only.

## 2022-05-21 NOTE — Telephone Encounter (Signed)
Called and spoke to patient. He voiced understanding about vaccine recommendations. He states that for the last two weeks he has had increased sputum production that comes and goes. He states when he is having a lot of mucus it is yellow. He states this last for a few days and then goes away for about 4 days and then comes back again. He has been taking robitussin daily. No fevers. He wants to know:   1) can something be called in to help him with this  2) Should he take covid or flu shot when he is having the increased sputum production.   Please advise

## 2022-05-29 ENCOUNTER — Telehealth: Payer: Self-pay | Admitting: Pulmonary Disease

## 2022-05-29 NOTE — Telephone Encounter (Signed)
Pt states he is uncomfortable when he breathes deep. Left side pain near his heart and lung when taking a deep breath. States this began overnight. No other new or worsening symptoms.   Offered to make patient an appt in [facility] with Dr. Melvyn Novas but patient refuses to see anyone but Dr. Elsworth Soho.  Patient wants to know if he can be added on to schedule tomorrow in [place] office with Dr. Elsworth Soho. If not, patient wants to know if Dr. Elsworth Soho recommends going to the ER.

## 2022-05-29 NOTE — Telephone Encounter (Signed)
Scheduled patient for an appt in [place] office 4:00 Dr. Elsworth Soho. Patient aware than plan is to do an EKG and CXR and appt is at the [facility] office. Nothing further needed

## 2022-05-31 ENCOUNTER — Encounter: Payer: Self-pay | Admitting: Pulmonary Disease

## 2022-05-31 ENCOUNTER — Ambulatory Visit: Payer: Medicare Other | Admitting: Pulmonary Disease

## 2022-05-31 ENCOUNTER — Ambulatory Visit (INDEPENDENT_AMBULATORY_CARE_PROVIDER_SITE_OTHER): Payer: Medicare Other

## 2022-05-31 VITALS — BP 110/56 | HR 82 | Temp 97.7°F | Ht 70.0 in | Wt 177.2 lb

## 2022-05-31 DIAGNOSIS — C3411 Malignant neoplasm of upper lobe, right bronchus or lung: Secondary | ICD-10-CM

## 2022-05-31 DIAGNOSIS — J4489 Other specified chronic obstructive pulmonary disease: Secondary | ICD-10-CM | POA: Diagnosis not present

## 2022-05-31 DIAGNOSIS — R053 Chronic cough: Secondary | ICD-10-CM | POA: Diagnosis not present

## 2022-05-31 NOTE — Progress Notes (Signed)
   Subjective:    Patient ID: Paul Sandoval., male    DOB: 12-14-1940, 81 y.o.   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 - s/p chemoRT  S/p VATS & talc pleurodesis on RT Sputum cultures 07/2020 - [place] , 09/2021 -resistant Pseudomonas   PMH - CKD -3, ischemic cardiomyopathy - EF 30%  s/p CABG in 1996 and PCI to dLM and SVG-D in 02/2014   Meds - skin rash with Anoro, even when he was on other medications such as Wixela and Symbicort, thrush with wixela    OV 11/2021 >> dc anoro & stiolto,  changed to wixela, gets thrush  ED visit on 6/25 >> doxy + pred 7/11 >> Rx with levaquin x 7ds   Last OV 7/25 >> flutter valve & robitussin  X Rx for hypertonic saline  Chief Complaint  Patient presents with   Acute Visit    Pt states he has a cough and chest congestion x 1 month. Tried Robitussin DM and Mucinex without much relief   Last office visit we obtain sputum culture which grew out Pseudomonas sensitive to doxycycline and Levaquin He called on 9/25 stating with her for the last 2 weeks he had increased sputum production, occasionally yellow now his cleared.  We asked him to take Mucinex for 10 days which she has.  He has been taking Robitussin for 15 days And again on 10/3 complaining of left-sided chest pain when taking a deep breath.  This pain is resolved now Oxygen saturation was 90% on room air   Significant tests/ events reviewed  02/2022 CT chest without con >> chronic changes right lung , no new infiltrates or lymphadenopathy   08/2021 CT chest wo con >> new area of groundglass left upper lobe   02/2021 CT chest postirradiation appearance with dense fibrotic consolidation and volume loss right upper lobe, loculated right small effusion with pleural  calcification Enlarged subcarinal lymph node stable for more than 2 years     04/09/2018-echocardiogram-LV  ejection fraction 25 to 30%, diffuse hypokinesis,   02/01/2016-pulmonary function test- FVC 1.96 (45% predicted), postbronchodilator ratio of 67, postbronchodilator FEV1 1.64 (53% predicted, postbronchodilator response, significant mid flow reversibility, DLCO 58   Review of Systems neg for any significant sore throat, dysphagia, itching, sneezing, nasal congestion or excess/ purulent secretions, fever, chills, sweats, unintended wt loss, pleuritic or exertional cp, hempoptysis, orthopnea pnd or change in chronic leg swelling. Also denies presyncope, palpitations, heartburn, abdominal pain, nausea, vomiting, diarrhea or change in bowel or urinary habits, dysuria,hematuria, rash, arthralgias, visual complaints, headache, numbness weakness or ataxia.     Objective:   Physical Exam  Gen. Pleasant, well-nourished, in no distress ENT - no thrush, no pallor/icterus,no post nasal drip Neck: No JVD, no thyromegaly, no carotid bruits Lungs: no use of accessory muscles, no dullness to percussion, crackles left anterior, right base, no rhonchi Cardiovascular: Rhythm regular, heart sounds  normal, no murmurs or gallops, no peripheral edema Musculoskeletal: No deformities, no cyanosis or clubbing        Assessment & Plan:

## 2022-05-31 NOTE — Assessment & Plan Note (Signed)
Chest x-ray reviewed, no evidence of pneumonia.  No new effusions. He does not seem to be having a COPD exacerbation.  This could be related to allergies.  No evidence of active infection. We recently treated him with Levaquin which seems to be appropriate for a sensitive Pseudomonas in sputum.  EKG was reviewed which shows right bundle branch pattern as before. We will give him a course of prednisone and see how he does.  If he has persistent symptoms we will proceed with CT chest without contrast to further clarify, chest x-ray may be difficult to interpret because he has significant chronic changes

## 2022-05-31 NOTE — Assessment & Plan Note (Signed)
Last CT chest was reviewed which shows chronic changes and no evidence of recurrent malignancy

## 2022-05-31 NOTE — Patient Instructions (Addendum)
X CXR X EKG   X Prednisone 10 mg tabs Take 4 tabs  daily with food x 4 days, then 3 tabs daily x 4 days, then 2 tabs daily x 4 days, then 1 tab daily x4 days then stop. #40  X CT chest wo con in 2 weeks

## 2022-06-01 ENCOUNTER — Telehealth: Payer: Self-pay | Admitting: Pulmonary Disease

## 2022-06-01 MED ORDER — PREDNISONE 10 MG PO TABS: ORAL_TABLET | ORAL | 0 refills | Status: AC

## 2022-06-01 NOTE — Telephone Encounter (Signed)
Called and spoke to patient. Sent prednisone in to [facility] as patient requested. Nothing further needed.

## 2022-06-02 ENCOUNTER — Other Ambulatory Visit: Payer: Self-pay

## 2022-06-02 ENCOUNTER — Encounter (HOSPITAL_COMMUNITY): Payer: Self-pay

## 2022-06-02 ENCOUNTER — Inpatient Hospital Stay (HOSPITAL_COMMUNITY)
Admission: EM | Admit: 2022-06-02 | Discharge: 2022-06-07 | DRG: 190 | Disposition: A | Discharge status: Home / Self Care | Payer: Medicare Other | Attending: Internal Medicine | Admitting: Internal Medicine

## 2022-06-02 ENCOUNTER — Emergency Department (HOSPITAL_COMMUNITY): Payer: Medicare Other

## 2022-06-02 DIAGNOSIS — Z955 Presence of coronary angioplasty implant and graft: Secondary | ICD-10-CM | POA: Diagnosis not present

## 2022-06-02 DIAGNOSIS — D696 Thrombocytopenia, unspecified: Secondary | ICD-10-CM | POA: Diagnosis present

## 2022-06-02 DIAGNOSIS — Z801 Family history of malignant neoplasm of trachea, bronchus and lung: Secondary | ICD-10-CM

## 2022-06-02 DIAGNOSIS — J440 Chronic obstructive pulmonary disease with acute lower respiratory infection: Principal | ICD-10-CM | POA: Diagnosis present

## 2022-06-02 DIAGNOSIS — D72828 Other elevated white blood cell count: Secondary | ICD-10-CM | POA: Diagnosis not present

## 2022-06-02 DIAGNOSIS — I2510 Atherosclerotic heart disease of native coronary artery without angina pectoris: Secondary | ICD-10-CM | POA: Diagnosis present

## 2022-06-02 DIAGNOSIS — Z888 Allergy status to other drugs, medicaments and biological substances status: Secondary | ICD-10-CM

## 2022-06-02 DIAGNOSIS — E785 Hyperlipidemia, unspecified: Secondary | ICD-10-CM | POA: Diagnosis present

## 2022-06-02 DIAGNOSIS — J209 Acute bronchitis, unspecified: Secondary | ICD-10-CM | POA: Diagnosis present

## 2022-06-02 DIAGNOSIS — N1832 Chronic kidney disease, stage 3b: Secondary | ICD-10-CM | POA: Diagnosis present

## 2022-06-02 DIAGNOSIS — Z79899 Other long term (current) drug therapy: Secondary | ICD-10-CM | POA: Diagnosis not present

## 2022-06-02 DIAGNOSIS — Z85118 Personal history of other malignant neoplasm of bronchus and lung: Secondary | ICD-10-CM

## 2022-06-02 DIAGNOSIS — Z96642 Presence of left artificial hip joint: Secondary | ICD-10-CM | POA: Diagnosis present

## 2022-06-02 DIAGNOSIS — I5033 Acute on chronic diastolic (congestive) heart failure: Secondary | ICD-10-CM | POA: Diagnosis not present

## 2022-06-02 DIAGNOSIS — J9601 Acute respiratory failure with hypoxia: Secondary | ICD-10-CM | POA: Diagnosis present

## 2022-06-02 DIAGNOSIS — K219 Gastro-esophageal reflux disease without esophagitis: Secondary | ICD-10-CM | POA: Diagnosis present

## 2022-06-02 DIAGNOSIS — I5042 Chronic combined systolic (congestive) and diastolic (congestive) heart failure: Secondary | ICD-10-CM | POA: Diagnosis present

## 2022-06-02 DIAGNOSIS — I130 Hypertensive heart and chronic kidney disease with heart failure and stage 1 through stage 4 chronic kidney disease, or unspecified chronic kidney disease: Secondary | ICD-10-CM | POA: Diagnosis present

## 2022-06-02 DIAGNOSIS — I255 Ischemic cardiomyopathy: Secondary | ICD-10-CM | POA: Diagnosis present

## 2022-06-02 DIAGNOSIS — R0602 Shortness of breath: Secondary | ICD-10-CM | POA: Diagnosis present

## 2022-06-02 DIAGNOSIS — Z87891 Personal history of nicotine dependence: Secondary | ICD-10-CM

## 2022-06-02 DIAGNOSIS — Z8249 Family history of ischemic heart disease and other diseases of the circulatory system: Secondary | ICD-10-CM

## 2022-06-02 DIAGNOSIS — I5043 Acute on chronic combined systolic (congestive) and diastolic (congestive) heart failure: Secondary | ICD-10-CM | POA: Diagnosis present

## 2022-06-02 DIAGNOSIS — T380X5A Adverse effect of glucocorticoids and synthetic analogues, initial encounter: Secondary | ICD-10-CM | POA: Diagnosis not present

## 2022-06-02 DIAGNOSIS — I252 Old myocardial infarction: Secondary | ICD-10-CM

## 2022-06-02 DIAGNOSIS — Z8261 Family history of arthritis: Secondary | ICD-10-CM | POA: Diagnosis not present

## 2022-06-02 DIAGNOSIS — Z7189 Other specified counseling: Secondary | ICD-10-CM | POA: Diagnosis not present

## 2022-06-02 DIAGNOSIS — Z951 Presence of aortocoronary bypass graft: Secondary | ICD-10-CM

## 2022-06-02 DIAGNOSIS — D631 Anemia in chronic kidney disease: Secondary | ICD-10-CM | POA: Diagnosis present

## 2022-06-02 DIAGNOSIS — Z7902 Long term (current) use of antithrombotics/antiplatelets: Secondary | ICD-10-CM

## 2022-06-02 DIAGNOSIS — Z7984 Long term (current) use of oral hypoglycemic drugs: Secondary | ICD-10-CM

## 2022-06-02 DIAGNOSIS — Z7951 Long term (current) use of inhaled steroids: Secondary | ICD-10-CM

## 2022-06-02 DIAGNOSIS — Z515 Encounter for palliative care: Secondary | ICD-10-CM | POA: Diagnosis not present

## 2022-06-02 DIAGNOSIS — Z1152 Encounter for screening for COVID-19: Secondary | ICD-10-CM | POA: Diagnosis not present

## 2022-06-02 DIAGNOSIS — R6510 Systemic inflammatory response syndrome (SIRS) of non-infectious origin without acute organ dysfunction: Secondary | ICD-10-CM | POA: Diagnosis present

## 2022-06-02 DIAGNOSIS — Z9221 Personal history of antineoplastic chemotherapy: Secondary | ICD-10-CM

## 2022-06-02 DIAGNOSIS — I2489 Other forms of acute ischemic heart disease: Secondary | ICD-10-CM | POA: Diagnosis present

## 2022-06-02 DIAGNOSIS — J441 Chronic obstructive pulmonary disease with (acute) exacerbation: Secondary | ICD-10-CM | POA: Diagnosis present

## 2022-06-02 DIAGNOSIS — I4510 Unspecified right bundle-branch block: Secondary | ICD-10-CM | POA: Diagnosis present

## 2022-06-02 DIAGNOSIS — Z8719 Personal history of other diseases of the digestive system: Secondary | ICD-10-CM

## 2022-06-02 DIAGNOSIS — Z9185 Personal history of military service: Secondary | ICD-10-CM

## 2022-06-02 LAB — CBC WITH DIFFERENTIAL/PLATELET
Abs Immature Granulocytes: 0.16 10*3/uL — ABNORMAL HIGH (ref 0.00–0.07)
Basophils Absolute: 0 10*3/uL (ref 0.0–0.1)
Basophils Relative: 0 %
Eosinophils Absolute: 0 10*3/uL (ref 0.0–0.5)
Eosinophils Relative: 0 %
HCT: 34.6 % — ABNORMAL LOW (ref 39.0–52.0)
Hemoglobin: 10.8 g/dL — ABNORMAL LOW (ref 13.0–17.0)
Immature Granulocytes: 1 %
Lymphocytes Relative: 3 %
Lymphs Abs: 0.6 10*3/uL — ABNORMAL LOW (ref 0.7–4.0)
MCH: 27.2 pg (ref 26.0–34.0)
MCHC: 31.2 g/dL (ref 30.0–36.0)
MCV: 87.2 fL (ref 80.0–100.0)
Monocytes Absolute: 0.9 10*3/uL (ref 0.1–1.0)
Monocytes Relative: 5 %
Neutro Abs: 16.8 10*3/uL — ABNORMAL HIGH (ref 1.7–7.7)
Neutrophils Relative %: 91 %
Platelets: 147 10*3/uL — ABNORMAL LOW (ref 150–400)
RBC: 3.97 MIL/uL — ABNORMAL LOW (ref 4.22–5.81)
RDW: 15.5 % (ref 11.5–15.5)
WBC: 18.5 10*3/uL — ABNORMAL HIGH (ref 4.0–10.5)
nRBC: 0 % (ref 0.0–0.2)

## 2022-06-02 LAB — BASIC METABOLIC PANEL
Anion gap: 9 (ref 5–15)
BUN: 28 mg/dL — ABNORMAL HIGH (ref 8–23)
CO2: 26 mmol/L (ref 22–32)
Calcium: 8.5 mg/dL — ABNORMAL LOW (ref 8.9–10.3)
Chloride: 101 mmol/L (ref 98–111)
Creatinine, Ser: 1.81 mg/dL — ABNORMAL HIGH (ref 0.61–1.24)
GFR, Estimated: 37 mL/min — ABNORMAL LOW (ref >60)
Glucose, Bld: 123 mg/dL — ABNORMAL HIGH (ref 70–99)
Potassium: 3.9 mmol/L (ref 3.5–5.1)
Sodium: 136 mmol/L (ref 135–145)

## 2022-06-02 LAB — TROPONIN I (HIGH SENSITIVITY)
Troponin I (High Sensitivity): 26 ng/L — ABNORMAL HIGH (ref <18)
Troponin I (High Sensitivity): 24 ng/L — ABNORMAL HIGH (ref <18)

## 2022-06-02 LAB — RESP PANEL BY RT-PCR (FLU A&B, COVID) ARPGX2
Influenza A by PCR: NEGATIVE
Influenza B by PCR: NEGATIVE
SARS Coronavirus 2 by RT PCR: NEGATIVE

## 2022-06-02 MED ORDER — METHYLPREDNISOLONE SODIUM SUCC 125 MG IJ SOLR
125.0000 mg | Freq: Once | INTRAMUSCULAR | Status: AC
  Administered 2022-06-02: 125 mg via INTRAVENOUS
  Filled 2022-06-02: qty 2

## 2022-06-02 MED ORDER — ALBUTEROL SULFATE (2.5 MG/3ML) 0.083% IN NEBU: 2.5000 mg | INHALATION_SOLUTION | RESPIRATORY_TRACT | Status: DC | PRN

## 2022-06-02 MED ORDER — ALBUTEROL SULFATE (2.5 MG/3ML) 0.083% IN NEBU
2.5000 mg | INHALATION_SOLUTION | Freq: Once | RESPIRATORY_TRACT | Status: AC
  Administered 2022-06-02: 2.5 mg via RESPIRATORY_TRACT
  Filled 2022-06-02: qty 3

## 2022-06-02 MED ORDER — MAGNESIUM SULFATE 2 GM/50ML IV SOLN
2.0000 g | Freq: Once | INTRAVENOUS | Status: AC
  Administered 2022-06-02: 2 g via INTRAVENOUS
  Filled 2022-06-02: qty 50

## 2022-06-02 MED ORDER — IPRATROPIUM-ALBUTEROL 0.5-2.5 (3) MG/3ML IN SOLN
3.0000 mL | Freq: Once | RESPIRATORY_TRACT | Status: AC
  Administered 2022-06-02: 3 mL via RESPIRATORY_TRACT
  Filled 2022-06-02: qty 3

## 2022-06-02 MED ORDER — METHYLPREDNISOLONE SODIUM SUCC 125 MG IJ SOLR
80.0000 mg | Freq: Two times a day (BID) | INTRAMUSCULAR | Status: DC
  Administered 2022-06-03 – 2022-06-04 (×3): 80 mg via INTRAVENOUS
  Filled 2022-06-02 (×3): qty 2

## 2022-06-02 MED ORDER — ACETAMINOPHEN 650 MG RE SUPP: 650.0000 mg | Freq: Four times a day (QID) | RECTAL | Status: DC | PRN

## 2022-06-02 MED ORDER — SODIUM CHLORIDE 0.9 % IV SOLN
100.0000 mg | Freq: Two times a day (BID) | INTRAVENOUS | Status: DC
  Administered 2022-06-03 – 2022-06-04 (×5): 100 mg via INTRAVENOUS
  Filled 2022-06-02 (×7): qty 100

## 2022-06-02 MED ORDER — ACETAMINOPHEN 325 MG PO TABS: 650.0000 mg | ORAL_TABLET | Freq: Four times a day (QID) | ORAL | Status: DC | PRN

## 2022-06-02 MED ORDER — IPRATROPIUM-ALBUTEROL 0.5-2.5 (3) MG/3ML IN SOLN
3.0000 mL | Freq: Four times a day (QID) | RESPIRATORY_TRACT | Status: DC
  Administered 2022-06-03 – 2022-06-04 (×8): 3 mL via RESPIRATORY_TRACT
  Filled 2022-06-02 (×8): qty 3

## 2022-06-02 NOTE — H&P (Signed)
History and Physical    PLEASE NOTE THAT DRAGON DICTATION SOFTWARE WAS USED IN THE CONSTRUCTION OF THIS NOTE.   [place] ZWC:[id number] DOB: December 12, 1940 DOA: 06/02/2022  PCP: Coral Spikes, DO *** Patient coming from: home ***  I have personally briefly reviewed patient's old medical records in [place]  Chief Complaint: ***  HPI: Paul Offord. is a 81 y.o. male with medical history significant for *** who is admitted to [facility] on 06/02/2022 with *** after presenting from home*** to [facility] ED complaining of ***.    ***    ***SOB: Denies any associated orthopnea, PND, or new onset peripheral edema. No recent chest pain, diaphoresis, palpitations, N/V, pre-syncope, or syncope. Not associated with any recent cough, wheezing, hemoptysis, new lower extremity erythema, or calf tenderness. Denies any recent trauma, travel, surgical procedures, or periods of prolonged diminished ambulatory status. No recent melena or hematochezia.   Denies any associated subjective fever, chills, rigors, or generalized myalgias. No recent headache, neck stiffness, rhinitis, rhinorrhea, sore throat, abdominal pain, diarrhea, or rash. No known recent COVID-19 exposures. Denies dysuria, gross hematuria, or change in urinary urgency/frequency.  ***   ***misc/infectious: Denies any subjective fever, chills, rigors, or generalized myalgias. Denies any recent headache, neck stiffness, rhinitis, rhinorrhea, sore throat, sob, wheezing, cough, nausea, vomiting, abdominal pain, diarrhea, or rash. No recent traveling or known COVID-19 exposures. Denies dysuria, gross hematuria, or change in urinary urgency/frequency.  Denies any recent chest pain, diaphoresis, or palpitations. ***    ED Course:  Vital signs in the ED were notable for the following: ***  Labs were notable for the following: ***  Imaging and additional notable ED work-up: ***  While in the ED, the following were  administered: ***  Subsequently, the patient was admitted  ***  ***red   Review of Systems: As per HPI otherwise 10 point review of systems negative.   Past Medical History:  Diagnosis Date   AAA (abdominal aortic aneurysm) ([place]) 2010   4.4 cm 08/2008;4.44 in 7/10 and 4.65 in 08/2009; 4.8 by CT in 11/2009; 4.3 by ultrasound in 08/2010   Anemia    Arteriosclerotic cardiovascular disease (ASCVD) 1996   CABG-1996   Arthritis    "fingers" (03/18/2014)   CAD (coronary artery disease)    03/18/14:  PCI with DES to distal left main. 7/29: DES to the SVG to Diag   Cancer [facility])    Upper right lobe lung cancer   Cardiomyopathy, ischemic    Echo 03/17/14: EF 45-50%   CHF (congestive heart failure) (HCC)    Chronic bronchitis (HCC)    Chronic kidney disease    CRF   Chronic rhinitis    Colonic polyp 2002   polypectomy in 2002   COPD (chronic obstructive pulmonary disease) ([place])    Diverticulosis    Dyspnea    with exertion   ED (erectile dysfunction)    Encounter for antineoplastic chemotherapy 12/19/2015   GERD (gastroesophageal reflux disease)    History of blood transfusion    Hyperlipidemia    Hypertension    IFG (impaired fasting glucose)    Myocardial infarction [facility])    "told h/o silent MI sometime before 1996"   Pneumonia ~ 2001; ~ 2005    has had more than twice   Right bundle branch block    Tobacco abuse, in remission    40 pack year total consumption; discontinued in 1996    Past Surgical History:  Procedure  Laterality Date   ABDOMINAL AORTIC ANEURYSM REPAIR  11/2012   ABDOMINAL AORTIC ENDOVASCULAR STENT GRAFT N/A 12/11/2012   Procedure: ABDOMINAL AORTIC ENDOVASCULAR STENT GRAFT;  Surgeon: Mal Misty, MD;  Location: [place];  Service: Vascular;  Laterality: N/A;  Ultrasound guided; [place]  01/08/1995   COLONOSCOPY  2002   polypectomy-patient denies   CORONARY ANGIOPLASTY WITH STENT PLACEMENT  03/18/2014   "1"   CORONARY ANGIOPLASTY WITH STENT  PLACEMENT  03/24/2014   "1"   CORONARY ARTERY BYPASS GRAFT  01/09/1995   "CABG X3"   FLEXIBLE BRONCHOSCOPY N/A 03/01/2017   Procedure: FLEXIBLE BRONCHOSCOPY WITH BIOPSIES;  Surgeon: Gaye Pollack, MD;  Location: [place] OR;  Service: Thoracic;  Laterality: N/A;   JOINT REPLACEMENT     LAPAROSCOPIC CHOLECYSTECTOMY  12/2009   LEFT AND RIGHT HEART CATHETERIZATION WITH CORONARY/GRAFT ANGIOGRAM N/A 03/18/2014   Procedure: LEFT AND RIGHT HEART CATHETERIZATION WITH Beatrix Fetters;  Surgeon: Blane Ohara, MD;  Location: [facility] CATH LAB;  Service: Cardiovascular;  Laterality: N/A;   PERCUTANEOUS CORONARY STENT INTERVENTION (PCI-S)  03/18/2014   Procedure: PERCUTANEOUS CORONARY STENT INTERVENTION (PCI-S);  Surgeon: Blane Ohara, MD;  Location: [facility] CATH LAB;  Service: Cardiovascular;;   PERCUTANEOUS CORONARY STENT INTERVENTION (PCI-S) N/A 03/24/2014   Procedure: PERCUTANEOUS CORONARY STENT INTERVENTION (PCI-S);  Surgeon: Blane Ohara, MD;  Location: [facility] C[facility] LAB;  Service: Cardiovascular;  Laterality: N/A;   PLEURAL EFFUSION DRAINAGE Right 03/01/2017   Procedure: DRAINAGE OF PLEURAL EFFUSION;  Surgeon: Gaye Pollack, MD;  Location: [place] OR;  Service: Thoracic;  Laterality: Right;   RIGHT/LEFT HEART CATH AND CORONARY/GRAFT ANGIOGRAPHY N/A 05/15/2017   Procedure: RIGHT/LEFT HEART CATH AND CORONARY/GRAFT ANGIOGRAPHY;  Surgeon: Larey Dresser, MD;  Location: [place] CV LAB;  Service: Cardiovascular;  Laterality: N/A;   TALC PLEURODESIS Right 03/01/2017   Procedure: Pietro Cassis;  Surgeon: Gaye Pollack, MD;  Location: [place];  Service: Thoracic;  Laterality: Right;   TOTAL HIP ARTHROPLASTY Left 01/21/2013   Procedure: TOTAL HIP ARTHROPLASTY ANTERIOR APPROACH;  Surgeon: Mauri Pole, MD;  Location: [place];  Service: Orthopedics;  Laterality: Left;   VIDEO ASSISTED THORACOSCOPY Right 03/01/2017   Procedure: VIDEO ASSISTED THORACOSCOPY WITH BIOPSIES;  Surgeon: Gaye Pollack, MD;  Location: [place];   Service: Thoracic;  Laterality: Right;   VIDEO BRONCHOSCOPY N/A 11/17/2015   Procedure: VIDEO BRONCHOSCOPY WITH FLUORO;  Surgeon: Rigoberto Noel, MD;  Location: [place];  Service: Cardiopulmonary;  Laterality: N/A;    Social History:  reports that he quit smoking about 27 years ago. His smoking use included cigarettes. He started smoking about 65 years ago. He has a 45.00 pack-year smoking history. He has never used smokeless tobacco. He reports that he does not drink alcohol and does not use drugs.   Allergies  Allergen Reactions   Neomycin Hives   Anoro Ellipta [Umeclidinium-Vilanterol] Itching    Rash over face, neck   Stiolto Respimat [Tiotropium Bromide-Olodaterol] Itching    Rash over face , neck   Cetirizine & Related Rash    Family History  Problem Relation Age of Onset   Heart disease Father    Cancer Father        Lung   Arthritis Mother    Parkinsonism Mother    Arthritis Sister        Brother with rheumatoid arthritis   Hypertension Brother    Colon cancer Neg Hx    Colon polyps Neg Hx  Family history reviewed and not pertinent ***   Prior to Admission medications   Medication Sig Start Date End Date Taking? Authorizing Provider  acetaminophen (TYLENOL) 500 MG tablet Take 1,000 mg by mouth every 6 (six) hours as needed for fever.    [provider]  albuterol (VENTOLIN HFA) 108 (90 Base) MCG/ACT inhaler Inhale 2 puffs into the lungs every 6 (six) hours as needed for wheezing or shortness of breath. 01/01/22   Coral Spikes, DO  carvedilol (COREG) 12.5 MG tablet TAKE 1 &1/2 TABLET BY MOUTH TWICE DAILY WITH A MEAL 02/12/22   Larey Dresser, MD  clopidogrel (PLAVIX) 75 MG tablet TAKE (1) TABLET BY MOUTH ONCE DAILY. 01/23/22   Larey Dresser, MD  dapagliflozin propanediol (FARXIGA) 10 MG TABS tablet Take 10 mg by mouth daily before breakfast. 08/27/19   Larey Dresser, MD  Dextromethorphan-guaiFENesin (ROBITUSSIN DM PO) Take 20 mLs by mouth daily as  needed (for cough).    [provider]  eplerenone (INSPRA) 50 MG tablet Take 1 tablet (50 mg total) by mouth every evening. 04/05/20   Larey Dresser, MD  fluticasone [facility]) 50 MCG/ACT nasal spray Place 1 spray into both nostrils daily as needed for allergies or rhinitis. 11/16/21   Rigoberto Noel, MD  fluticasone-salmeterol (WIXELA INHUB) 100-50 MCG/ACT AEPB Inhale 1 puff into the lungs 2 (two) times daily. 12/11/21   Rigoberto Noel, MD  furosemide (LASIX) 20 MG tablet Take 20 mg by mouth daily.    [provider]  ipratropium-albuterol (DUONEB) 0.5-2.5 (3) MG/3ML SOLN Take 3 mLs by nebulization in the morning, at noon, and at bedtime. 11/10/21   Rigoberto Noel, MD  nitroGLYCERIN (NITROSTAT) 0.4 MG SL tablet PLACE 1 TAB UNDER TONGUE EVERY 5 MIN IF NEEDED FOR CHEST PAIN. MAY USE 3 TIMES.NO RELIEF CALL 911. 01/01/22   Coral Spikes, DO  nystatin (MYCOSTATIN) 100000 UNIT/ML suspension Take 5 mLs (500,000 Units total) by mouth 4 (four) times daily. 03/06/22   Rigoberto Noel, MD  omeprazole (PRILOSEC) 20 MG capsule Take 1 capsule (20 mg total) by mouth daily. 03/14/22   Coral Spikes, DO  predniSONE (DELTASONE) 10 MG tablet Take 4 tablets (40 mg total) by mouth daily with breakfast for 4 days, THEN 3 tablets (30 mg total) daily with breakfast for 4 days, THEN 2 tablets (20 mg total) daily with breakfast for 4 days, THEN 1 tablet (10 mg total) daily with breakfast for 4 days. 06/01/22 06/17/22  Rigoberto Noel, MD  rosuvastatin (CRESTOR) 20 MG tablet TAKE ONE TABLET BY MOUTH ONCE DAILY. 03/20/22   Larey Dresser, MD  sacubitril-valsartan (ENTRESTO) 97-103 MG Take 0.5 tablets by mouth 2 (two) times daily. 07/13/20   [provider]  sodium chloride (OCEAN) 0.65 % SOLN nasal spray Place 1 spray into both nostrils as needed for congestion.    [provider]     Objective    Physical Exam: Vitals:   06/02/22 2030 06/02/22 2050 06/02/22 2115 06/02/22 2229  BP: (!) 128/50    (!) 122/49  Pulse: 98  93 93  Resp: 20   18  Temp:  98.6 F (37 C)    TempSrc:  Oral    SpO2: 95%  95% 94%  Weight:      Height:        General: appears to be stated age; alert, oriented Skin: warm, dry, no rash Head:  AT/[place] Mouth:  Oral mucosa membranes appear  moist, normal dentition Neck: supple; trachea midline Heart:  RRR; did not appreciate any M/R/G Lungs: CTAB, did not appreciate any wheezes, rales, or rhonchi Abdomen: + BS; soft, ND, NT Vascular: 2+ pedal pulses b/l; 2+ radial pulses b/l Extremities: no peripheral edema, no muscle wasting Neuro: strength and sensation intact in upper and lower extremities b/l    *** Neuro: 5/5 strength of the proximal and distal flexors and extensors of the upper and lower extremities bilaterally; sensation intact in upper and lower extremities b/l; cranial nerves II through XII grossly intact; no pronator drift; no evidence suggestive of slurred speech, dysarthria, or facial droop; Normal muscle tone. No tremors. *** Neuro: In the setting of the patient's current mental status and associated inability to follow instructions, unable to perform full neurologic exam at this time.  As such, assessment of strength, sensation, and cranial nerves is limited at this time. Patient noted to spontaneously move all 4 extremities. No tremors.  ***    Labs on Admission: I have personally reviewed following labs and imaging studies  CBC: Recent Labs  Lab 06/02/22 1913  WBC 18.5*  NEUTROABS 16.8*  HGB 10.8*  HCT 34.6*  MCV 87.2  PLT 161*   Basic Metabolic Panel: Recent Labs  Lab 06/02/22 1913  NA 136  K 3.9  CL 101  CO2 26  GLUCOSE 123*  BUN 28*  CREATININE 1.81*  CALCIUM 8.5*   GFR: Estimated Creatinine Clearance: 32.5 mL/min (A) (by C-G formula based on SCr of 1.81 mg/dL (H)). Liver Function Tests: No results for input(s): "AST", "ALT", "ALKPHOS", "BILITOT", "PROT", "ALBUMIN" in the last 168 hours. No results for input(s):  "LIPASE", "AMYLASE" in the last 168 hours. No results for input(s): "AMMONIA" in the last 168 hours. Coagulation Profile: No results for input(s): "INR", "PROTIME" in the last 168 hours. Cardiac Enzymes: No results for input(s): "CKTOTAL", "CKMB", "CKMBINDEX", "TROPONINI" in the last 168 hours. BNP (last 3 results) No results for input(s): "PROBNP" in the last 8760 hours. HbA1C: No results for input(s): "HGBA1C" in the last 72 hours. CBG: No results for input(s): "GLUCAP" in the last 168 hours. Lipid Profile: No results for input(s): "CHOL", "HDL", "LDLCALC", "TRIG", "CHOLHDL", "LDLDIRECT" in the last 72 hours. Thyroid Function Tests: No results for input(s): "TSH", "T4TOTAL", "FREET4", "T3FREE", "THYROIDAB" in the last 72 hours. Anemia Panel: No results for input(s): "VITAMINB12", "FOLATE", "FERRITIN", "TIBC", "IRON", "RETICCTPCT" in the last 72 hours. Urine analysis:    Component Value Date/Time   COLORURINE YELLOW 12/08/2020 1300   APPEARANCEUR HAZY (A) 12/08/2020 1300   LABSPEC 1.011 12/08/2020 1300   LABSPEC 1.010 03/28/2016 1244   PHURINE 5.0 12/08/2020 1300   GLUCOSEU 50 (A) 12/08/2020 1300   GLUCOSEU Negative 03/28/2016 1244   HGBUR NEGATIVE 12/08/2020 1300   BILIRUBINUR NEGATIVE 12/08/2020 1300   BILIRUBINUR Negative 03/28/2016 1244   KETONESUR NEGATIVE 12/08/2020 1300   PROTEINUR NEGATIVE 12/08/2020 1300   UROBILINOGEN 0.2 03/28/2016 1244   NITRITE NEGATIVE 12/08/2020 1300   LEUKOCYTESUR NEGATIVE 12/08/2020 1300   LEUKOCYTESUR Trace 03/28/2016 1244    Radiological Exams on Admission: DG Chest 2 View  Result Date: 06/02/2022 CLINICAL DATA:  Shortness of breath, history of congestive heart failure, bronchitis, former smoker EXAM: CHEST - 2 VIEW COMPARISON:  Multiple chest x-rays, most recently May 31, 2022, chest CT March 12, 2022 FINDINGS: Stable cardiomediastinal contours status post median sternotomy and CABG. Similar appearance of the heterogeneous opacities in  the right apex and right lower lobe, consistent with known fibrosis.  Similar appearance of chronic large right pleural effusion. No new focal pulmonary opacity. No large pneumothorax. No acute osseous abnormality. Partially visualized vascular stent in the upper mid abdomen. IMPRESSION: Overall similar appearance of the chronic opacities right lung and pleural effusion. No acute cardiopulmonary abnormality Electronically Signed   By: Beryle Flock M.D.   On: 06/02/2022 16:05     EKG: Independently reviewed, with result as described above. ***   Assessment/Plan    Principal Problem:   Acute exacerbation of chronic obstructive pulmonary disease (COPD) (HCC)  ***      ***          ***           ***          ***          ***          ***          ***          ***     ***  DVT prophylaxis: SCD's ***  Code Status: Full code*** Family Communication: none*** Disposition Plan: Per Rounding Team Consults called: none***;  Admission status: ***   PLEASE NOTE THAT DRAGON DICTATION SOFTWARE WAS USED IN THE CONSTRUCTION OF THIS NOTE.   [place] DO [facility] [place]   06/02/2022, 11:12 PM   ***

## 2022-06-02 NOTE — ED Provider Notes (Signed)
[place] DEPT Provider Note   CSN: [id number] Arrival date & time: 06/02/22  1518     History {Add pertinent medical, surgical, social history, OB history to HPI:1} Chief Complaint  Patient presents with   Shortness of Breath    Orry Sigl. is a 81 y.o. male.  Patient has a history of COPD and congestive heart failure.  Patient presents complaining of shortness of breath.  His O2 sats at home were 86%.  He recently saw the pulmonologist and was started on prednisone.  The history is provided by the patient and medical records. No language interpreter was used.  Shortness of Breath Severity:  Moderate Timing:  Constant Progression:  Worsening Chronicity:  New Context: activity   Relieved by:  Nothing Associated symptoms: no abdominal pain, no chest pain, no cough, no headaches and no rash        Home Medications Prior to Admission medications   Medication Sig Start Date End Date Taking? Authorizing Provider  acetaminophen (TYLENOL) 500 MG tablet Take 1,000 mg by mouth every 6 (six) hours as needed for fever.    [provider]  albuterol (VENTOLIN HFA) 108 (90 Base) MCG/ACT inhaler Inhale 2 puffs into the lungs every 6 (six) hours as needed for wheezing or shortness of breath. 01/01/22   Coral Spikes, DO  carvedilol (COREG) 12.5 MG tablet TAKE 1 &1/2 TABLET BY MOUTH TWICE DAILY WITH A MEAL 02/12/22   Larey Dresser, MD  clopidogrel (PLAVIX) 75 MG tablet TAKE (1) TABLET BY MOUTH ONCE DAILY. 01/23/22   Larey Dresser, MD  dapagliflozin propanediol (FARXIGA) 10 MG TABS tablet Take 10 mg by mouth daily before breakfast. 08/27/19   Larey Dresser, MD  Dextromethorphan-guaiFENesin (ROBITUSSIN DM PO) Take 20 mLs by mouth daily as needed (for cough).    [provider]  eplerenone (INSPRA) 50 MG tablet Take 1 tablet (50 mg total) by mouth every evening. 04/05/20   Larey Dresser, MD  fluticasone [facility]) 50 MCG/ACT  nasal spray Place 1 spray into both nostrils daily as needed for allergies or rhinitis. 11/16/21   Rigoberto Noel, MD  fluticasone-salmeterol (WIXELA INHUB) 100-50 MCG/ACT AEPB Inhale 1 puff into the lungs 2 (two) times daily. 12/11/21   Rigoberto Noel, MD  furosemide (LASIX) 20 MG tablet Take 20 mg by mouth daily.    [provider]  ipratropium-albuterol (DUONEB) 0.5-2.5 (3) MG/3ML SOLN Take 3 mLs by nebulization in the morning, at noon, and at bedtime. 11/10/21   Rigoberto Noel, MD  nitroGLYCERIN (NITROSTAT) 0.4 MG SL tablet PLACE 1 TAB UNDER TONGUE EVERY 5 MIN IF NEEDED FOR CHEST PAIN. MAY USE 3 TIMES.NO RELIEF CALL 911. 01/01/22   Coral Spikes, DO  nystatin (MYCOSTATIN) 100000 UNIT/ML suspension Take 5 mLs (500,000 Units total) by mouth 4 (four) times daily. 03/06/22   Rigoberto Noel, MD  omeprazole (PRILOSEC) 20 MG capsule Take 1 capsule (20 mg total) by mouth daily. 03/14/22   Coral Spikes, DO  predniSONE (DELTASONE) 10 MG tablet Take 4 tablets (40 mg total) by mouth daily with breakfast for 4 days, THEN 3 tablets (30 mg total) daily with breakfast for 4 days, THEN 2 tablets (20 mg total) daily with breakfast for 4 days, THEN 1 tablet (10 mg total) daily with breakfast for 4 days. 06/01/22 06/17/22  Rigoberto Noel, MD  rosuvastatin (CRESTOR) 20 MG tablet TAKE ONE TABLET BY MOUTH ONCE DAILY. 03/20/22  Larey Dresser, MD  sacubitril-valsartan (ENTRESTO) 97-103 MG Take 0.5 tablets by mouth 2 (two) times daily. 07/13/20   [provider]  sodium chloride (OCEAN) 0.65 % SOLN nasal spray Place 1 spray into both nostrils as needed for congestion.    [provider]      Allergies    Neomycin, Anoro ellipta [umeclidinium-vilanterol], Stiolto respimat [tiotropium bromide-olodaterol], and Cetirizine & related    Review of Systems   Review of Systems  Constitutional:  Negative for appetite change and fatigue.  HENT:  Negative for congestion, ear discharge and sinus pressure.    Eyes:  Negative for discharge.  Respiratory:  Positive for shortness of breath. Negative for cough.   Cardiovascular:  Negative for chest pain.  Gastrointestinal:  Negative for abdominal pain and diarrhea.  Genitourinary:  Negative for frequency and hematuria.  Musculoskeletal:  Negative for back pain.  Skin:  Negative for rash.  Neurological:  Negative for seizures and headaches.  Psychiatric/Behavioral:  Negative for hallucinations.     Physical Exam Updated Vital Signs BP (!) 122/49   Pulse 93   Temp 98.6 F (37 C) (Oral)   Resp 18   Ht 5\' 10"  (1.778 m)   Wt 80.4 kg   SpO2 94%   BMI 25.43 kg/m  Physical Exam Vitals and nursing note reviewed.  Constitutional:      Appearance: He is well-developed.  HENT:     Head: Normocephalic.     Right Ear: External ear normal.     Nose: Nose normal.  Eyes:     General: No scleral icterus.    Conjunctiva/sclera: Conjunctivae normal.  Neck:     Thyroid: No thyromegaly.  Cardiovascular:     Rate and Rhythm: Normal rate and regular rhythm.     Heart sounds: No murmur heard.    No friction rub. No gallop.  Pulmonary:     Breath sounds: No stridor. Wheezing present. No rales.  Chest:     Chest wall: No tenderness.  Abdominal:     General: There is no distension.     Tenderness: There is no abdominal tenderness. There is no rebound.  Musculoskeletal:        General: Normal range of motion.     Cervical back: Neck supple.  Lymphadenopathy:     Cervical: No cervical adenopathy.  Skin:    Findings: No erythema or rash.  Neurological:     Mental Status: He is alert and oriented to person, place, and time.     Motor: No abnormal muscle tone.     Coordination: Coordination normal.  Psychiatric:        Behavior: Behavior normal.     ED Results / Procedures / Treatments   Labs (all labs ordered are listed, but only abnormal results are displayed) Labs Reviewed  CBC WITH DIFFERENTIAL/PLATELET - Abnormal; Notable for the  following components:      Result Value   WBC 18.5 (*)    RBC 3.97 (*)    Hemoglobin 10.8 (*)    HCT 34.6 (*)    Platelets 147 (*)    Neutro Abs 16.8 (*)    Lymphs Abs 0.6 (*)    Abs Immature Granulocytes 0.16 (*)    All other components within normal limits  BASIC METABOLIC PANEL - Abnormal; Notable for the following components:   Glucose, Bld 123 (*)    BUN 28 (*)    Creatinine, Ser 1.81 (*)    Calcium 8.5 (*)  GFR, Estimated 37 (*)    All other components within normal limits  TROPONIN I (HIGH SENSITIVITY) - Abnormal; Notable for the following components:   Troponin I (High Sensitivity) 26 (*)    All other components within normal limits  TROPONIN I (HIGH SENSITIVITY) - Abnormal; Notable for the following components:   Troponin I (High Sensitivity) 24 (*)    All other components within normal limits  RESP PANEL BY RT-PCR (FLU A&B, COVID) ARPGX2  BRAIN NATRIURETIC PEPTIDE    EKG EKG Interpretation  Date/Time:  Saturday June 02 2022 15:30:38 EDT Ventricular Rate:  89 PR Interval:  191 QRS Duration: 153 QT Interval:  399 QTC Calculation: 486 R Axis:   97 Text Interpretation: Sinus rhythm Right bundle branch block Similar to June 2023 tracing Confirmed by Nanda Quinton [id number]) on 06/02/2022 4:03:50 PM  Radiology DG Chest 2 View  Result Date: 06/02/2022 CLINICAL DATA:  Shortness of breath, history of congestive heart failure, bronchitis, former smoker EXAM: CHEST - 2 VIEW COMPARISON:  Multiple chest x-rays, most recently May 31, 2022, chest CT March 12, 2022 FINDINGS: Stable cardiomediastinal contours status post median sternotomy and CABG. Similar appearance of the heterogeneous opacities in the right apex and right lower lobe, consistent with known fibrosis. Similar appearance of chronic large right pleural effusion. No new focal pulmonary opacity. No large pneumothorax. No acute osseous abnormality. Partially visualized vascular stent in the upper mid abdomen.  IMPRESSION: Overall similar appearance of the chronic opacities right lung and pleural effusion. No acute cardiopulmonary abnormality Electronically Signed   By: Beryle Flock M.D.   On: 06/02/2022 16:05    Procedures Procedures  {Document cardiac monitor, telemetry assessment procedure when appropriate:1}  Medications Ordered in ED Medications  ipratropium-albuterol (DUONEB) 0.5-2.5 (3) MG/3ML nebulizer solution 3 mL (3 mLs Nebulization Given 06/02/22 2033)  albuterol (PROVENTIL) (2.5 MG/3ML) 0.083% nebulizer solution 2.5 mg (2.5 mg Nebulization Given 06/02/22 2033)  magnesium sulfate IVPB 2 g 50 mL (2 g Intravenous New Bag/Given 06/02/22 2034)  methylPREDNISolone sodium succinate (SOLU-MEDROL) 125 mg/2 mL injection 125 mg (125 mg Intravenous Given 06/02/22 2032)    ED Course/ Medical Decision Making/ A&P                           Medical Decision Making Amount and/or Complexity of Data Reviewed Labs: ordered.  Risk Prescription drug management.   Patient with COPD exacerbation.  He will be admitted to medicine  {Document critical care time when appropriate:1} {Document review of labs and clinical decision tools ie heart score, Chads2Vasc2 etc:1}  {Document your independent review of radiology images, and any outside records:1} {Document your discussion with family members, caretakers, and with consultants:1} {Document social determinants of health affecting pt's care:1} {Document your decision making why or why not admission, treatments were needed:1} Final Clinical Impression(s) / ED Diagnoses Final diagnoses:  COPD exacerbation ([place])    Rx / DC Orders ED Discharge Orders     None

## 2022-06-02 NOTE — ED Provider Triage Note (Signed)
Emergency Medicine Provider Triage Evaluation Note  Paul Sandoval. , a 81 y.o. male  was evaluated in triage.  Pt complains of SOB. CA in remission. Follows with pulm. Given steroids, recent sputum culture with pseudomonas, treated with abx. Worsen SOB despite meds  Review of Systems  Positive: sob Negative:   Physical Exam  BP (!) 112/54 (BP Location: Left Arm)   Pulse 89   Temp 98.4 F (36.9 C) (Oral)   Resp (!) 24   Ht 5\' 10"  (1.778 m)   Wt 80.4 kg   SpO2 96%   BMI 25.43 kg/m  Gen:   Awake, no distress   Resp:  Normal effort, hypoxic on [place] MSK:   Moves extremities without difficulty  Other:    Medical Decision Making  Medically screening exam initiated at 4:25 PM.  Appropriate orders placed.  Jolee Ewing. was informed that the remainder of the evaluation will be completed by another provider, this initial triage assessment does not replace that evaluation, and the importance of remaining in the ED until their evaluation is complete.  SOB   Jolynne Spurgin A, PA-C 06/02/22 1626

## 2022-06-02 NOTE — ED Triage Notes (Signed)
Patient c/o SOB x 2 days. Patient states that he went to his pulmonologist 2 days ago and was given a prescription for Prednisone. Patient states that he had increased SOB today and  states that his Sats were 86% at home. Sats in triage 89%. Patient was placed on O2 2L/min via [place] and sats increased to 93%.

## 2022-06-03 ENCOUNTER — Encounter (HOSPITAL_COMMUNITY): Payer: Self-pay | Admitting: Internal Medicine

## 2022-06-03 DIAGNOSIS — J9601 Acute respiratory failure with hypoxia: Secondary | ICD-10-CM | POA: Diagnosis present

## 2022-06-03 DIAGNOSIS — R6510 Systemic inflammatory response syndrome (SIRS) of non-infectious origin without acute organ dysfunction: Secondary | ICD-10-CM | POA: Diagnosis present

## 2022-06-03 DIAGNOSIS — D631 Anemia in chronic kidney disease: Secondary | ICD-10-CM | POA: Diagnosis present

## 2022-06-03 LAB — COMPREHENSIVE METABOLIC PANEL
ALT: 11 U/L (ref 0–44)
AST: 12 U/L — ABNORMAL LOW (ref 15–41)
Albumin: 3 g/dL — ABNORMAL LOW (ref 3.5–5.0)
Alkaline Phosphatase: 58 U/L (ref 38–126)
Anion gap: 8 (ref 5–15)
BUN: 26 mg/dL — ABNORMAL HIGH (ref 8–23)
CO2: 24 mmol/L (ref 22–32)
Calcium: 8.5 mg/dL — ABNORMAL LOW (ref 8.9–10.3)
Chloride: 103 mmol/L (ref 98–111)
Creatinine, Ser: 1.71 mg/dL — ABNORMAL HIGH (ref 0.61–1.24)
GFR, Estimated: 39 mL/min — ABNORMAL LOW (ref >60)
Glucose, Bld: 156 mg/dL — ABNORMAL HIGH (ref 70–99)
Potassium: 3.9 mmol/L (ref 3.5–5.1)
Sodium: 135 mmol/L (ref 135–145)
Total Bilirubin: 0.7 mg/dL (ref 0.3–1.2)
Total Protein: 6.8 g/dL (ref 6.5–8.1)

## 2022-06-03 LAB — CBC WITH DIFFERENTIAL/PLATELET
Abs Immature Granulocytes: 0.19 10*3/uL — ABNORMAL HIGH (ref 0.00–0.07)
Basophils Absolute: 0 10*3/uL (ref 0.0–0.1)
Basophils Relative: 0 %
Eosinophils Absolute: 0 10*3/uL (ref 0.0–0.5)
Eosinophils Relative: 0 %
HCT: 33.2 % — ABNORMAL LOW (ref 39.0–52.0)
Hemoglobin: 10.6 g/dL — ABNORMAL LOW (ref 13.0–17.0)
Immature Granulocytes: 1 %
Lymphocytes Relative: 3 %
Lymphs Abs: 0.4 10*3/uL — ABNORMAL LOW (ref 0.7–4.0)
MCH: 27.7 pg (ref 26.0–34.0)
MCHC: 31.9 g/dL (ref 30.0–36.0)
MCV: 86.7 fL (ref 80.0–100.0)
Monocytes Absolute: 0.4 10*3/uL (ref 0.1–1.0)
Monocytes Relative: 2 %
Neutro Abs: 15.8 10*3/uL — ABNORMAL HIGH (ref 1.7–7.7)
Neutrophils Relative %: 94 %
Platelets: 129 10*3/uL — ABNORMAL LOW (ref 150–400)
RBC: 3.83 MIL/uL — ABNORMAL LOW (ref 4.22–5.81)
RDW: 15.7 % — ABNORMAL HIGH (ref 11.5–15.5)
WBC: 16.7 10*3/uL — ABNORMAL HIGH (ref 4.0–10.5)
nRBC: 0 % (ref 0.0–0.2)

## 2022-06-03 LAB — BLOOD GAS, VENOUS
Acid-base deficit: 0.4 mmol/L (ref 0.0–2.0)
Bicarbonate: 26.7 mmol/L (ref 20.0–28.0)
O2 Saturation: 29.9 %
Patient temperature: 37.1
pCO2, Ven: 53 mmHg (ref 44–60)
pH, Ven: 7.31 (ref 7.25–7.43)
pO2, Ven: <31 mmHg — CL (ref 32–45)

## 2022-06-03 LAB — URINALYSIS, COMPLETE (UACMP) WITH MICROSCOPIC
Bacteria, UA: NONE SEEN
Bilirubin Urine: NEGATIVE
Glucose, UA: >=500 mg/dL — AB
Hgb urine dipstick: NEGATIVE
Ketones, ur: NEGATIVE mg/dL
Leukocytes,Ua: NEGATIVE
Nitrite: NEGATIVE
Protein, ur: 30 mg/dL — AB
Specific Gravity, Urine: 1.013 (ref 1.005–1.030)
pH: 5 (ref 5.0–8.0)

## 2022-06-03 LAB — MAGNESIUM
Magnesium: 2.2 mg/dL (ref 1.7–2.4)
Magnesium: 2.3 mg/dL (ref 1.7–2.4)

## 2022-06-03 LAB — PROTIME-INR
INR: 1.3 — ABNORMAL HIGH (ref 0.8–1.2)
Prothrombin Time: 15.6 seconds — ABNORMAL HIGH (ref 11.4–15.2)

## 2022-06-03 LAB — PHOSPHORUS: Phosphorus: 3.7 mg/dL (ref 2.5–4.6)

## 2022-06-03 LAB — PROCALCITONIN: Procalcitonin: 0.37 ng/mL

## 2022-06-03 LAB — BRAIN NATRIURETIC PEPTIDE: B Natriuretic Peptide: 906.8 pg/mL — ABNORMAL HIGH (ref 0.0–100.0)

## 2022-06-03 MED ORDER — FLUTICASONE PROPIONATE 50 MCG/ACT NA SUSP: 1.0000 | Freq: Every day | NASAL | Status: DC | PRN

## 2022-06-03 MED ORDER — NITROGLYCERIN 0.4 MG SL SUBL: 0.4000 mg | SUBLINGUAL_TABLET | SUBLINGUAL | Status: DC | PRN

## 2022-06-03 MED ORDER — SACUBITRIL-VALSARTAN 97-103 MG PO TABS
1.0000 | ORAL_TABLET | Freq: Two times a day (BID) | ORAL | Status: DC
  Administered 2022-06-03 – 2022-06-07 (×8): 1 via ORAL
  Filled 2022-06-03 (×9): qty 1

## 2022-06-03 MED ORDER — FUROSEMIDE 10 MG/ML IJ SOLN
20.0000 mg | Freq: Every day | INTRAMUSCULAR | Status: DC
  Administered 2022-06-03 – 2022-06-04 (×2): 20 mg via INTRAVENOUS
  Filled 2022-06-03 (×2): qty 2

## 2022-06-03 MED ORDER — MOMETASONE FURO-FORMOTEROL FUM 100-5 MCG/ACT IN AERO
2.0000 | INHALATION_SPRAY | Freq: Two times a day (BID) | RESPIRATORY_TRACT | Status: DC
  Administered 2022-06-03 – 2022-06-07 (×8): 2 via RESPIRATORY_TRACT
  Filled 2022-06-03: qty 8.8

## 2022-06-03 MED ORDER — PANTOPRAZOLE SODIUM 40 MG PO TBEC
40.0000 mg | DELAYED_RELEASE_TABLET | Freq: Every day | ORAL | Status: DC
  Administered 2022-06-03 – 2022-06-07 (×5): 40 mg via ORAL
  Filled 2022-06-03 (×5): qty 1

## 2022-06-03 MED ORDER — SPIRONOLACTONE 25 MG PO TABS
25.0000 mg | ORAL_TABLET | Freq: Every day | ORAL | Status: DC
  Administered 2022-06-03 – 2022-06-07 (×4): 25 mg via ORAL
  Filled 2022-06-03 (×5): qty 1

## 2022-06-03 MED ORDER — CLOPIDOGREL BISULFATE 75 MG PO TABS
75.0000 mg | ORAL_TABLET | Freq: Every day | ORAL | Status: DC
  Administered 2022-06-03 – 2022-06-07 (×5): 75 mg via ORAL
  Filled 2022-06-03 (×5): qty 1

## 2022-06-03 MED ORDER — CARVEDILOL 6.25 MG PO TABS
18.7500 mg | ORAL_TABLET | Freq: Two times a day (BID) | ORAL | Status: DC
  Administered 2022-06-03 – 2022-06-07 (×8): 18.75 mg via ORAL
  Filled 2022-06-03 (×3): qty 1
  Filled 2022-06-03: qty 2
  Filled 2022-06-03 (×6): qty 1

## 2022-06-03 MED ORDER — ORAL CARE MOUTH RINSE: 15.0000 mL | OROMUCOSAL | Status: DC | PRN

## 2022-06-03 MED ORDER — ROSUVASTATIN CALCIUM 20 MG PO TABS
20.0000 mg | ORAL_TABLET | Freq: Every day | ORAL | Status: DC
  Administered 2022-06-03 – 2022-06-07 (×5): 20 mg via ORAL
  Filled 2022-06-03 (×5): qty 1

## 2022-06-03 MED ORDER — SALINE SPRAY 0.65 % NA SOLN
1.0000 | NASAL | Status: DC | PRN
  Administered 2022-06-04: 1 via NASAL
  Filled 2022-06-03: qty 44

## 2022-06-03 MED ORDER — DAPAGLIFLOZIN PROPANEDIOL 10 MG PO TABS
10.0000 mg | ORAL_TABLET | Freq: Every day | ORAL | Status: DC
  Administered 2022-06-04 – 2022-06-07 (×4): 10 mg via ORAL
  Filled 2022-06-03 (×4): qty 1

## 2022-06-03 MED ORDER — FUROSEMIDE 40 MG PO TABS
20.0000 mg | ORAL_TABLET | Freq: Every day | ORAL | Status: DC
  Administered 2022-06-03: 20 mg via ORAL
  Filled 2022-06-03: qty 1

## 2022-06-03 NOTE — Progress Notes (Signed)
[facility]                                                                               Paul Sandoval, is a 81 y.o. male, DOB - May 08, 1941, [id number] Admit date - 06/02/2022    Outpatient Primary MD for the patient is Paul Spikes, DO  LOS - 1  days    Brief summary   Paul Sandoval. is a 81 y.o. 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    Assessment & Plan    Assessment and Plan:  Acute respiratory failure with hypoxia secondary to a combination of acute copd exacerbation and mild acute on chronic combined CHF.  Covid and influenza PCR is negative.  Admit for IV steroids and IV lasix.  IV solumedrol, continue with duonebs every 6 hours [place] oxygen oxygen to keep sats greater than 90%.  Doxycycline for acute bronchitis.    Acute on chronic combined systolic and diastolic CHF.  BNP elevated at 906, CXR shows unchanged pleural effusions and chronic changes .  Pt on lasix 20 mg daily at home, he was started on IV lasix 20 mg daily and check urine output.  Monitor renal parameters while on IV lasix.  Continue with strict intake and output.     Stage 3 B CKD Creatinine appears to be at baseline.     Mild anemia and thrombocytopenia:  Continue to monitor.     Mildly elevated troponins from demand ischemia from acute CHF.    Hyperlipidemia:  Resume statin.       Estimated body mass index is 25.43 kg/m as calculated from the following:   Height as of this encounter: 5\' 10"  (1.778 m).   Weight as of this encounter: 80.4 kg.  Code Status: full code.  DVT Prophylaxis:  SCDs Start: 06/02/22  2309   Level of Care: Level of care: Progressive Family Communication: none at bedside.   Disposition Plan:     Remains inpatient appropriate:  IV lasix, IV solumedrol.   Procedures:  None.   Consultants:   None.   Antimicrobials:   Anti-infectives (From admission, onward)    Start     Dose/Rate Route Frequency Ordered Stop   06/02/22 2315  doxycycline (VIBRAMYCIN) 100 mg in sodium chloride 0.9 % 250 mL IVPB       Note to Pharmacy: (In setting of acute copd exac)   100 mg 125 mL/hr over 120 Minutes Intravenous Every 12 hours 06/02/22 2312          Medications  Scheduled Meds:  carvedilol  18.75 mg Oral BID WC   clopidogrel  75 mg Oral Daily   furosemide  20 mg Oral Daily   ipratropium-albuterol  3 mL Nebulization Q6H   methylPREDNISolone (SOLU-MEDROL) injection  80 mg Intravenous Q12H   pantoprazole  40 mg Oral Daily   rosuvastatin  20 mg Oral Daily  sacubitril-valsartan  1 tablet Oral BID   Continuous Infusions:  doxycycline (VIBRAMYCIN) IV 100 mg (06/03/22 0046)   PRN Meds:.acetaminophen **OR** acetaminophen, albuterol    Subjective:   Paul Sandoval was seen and examined today.  Still sob on 2 lit of [place] oxygen.   Objective:   Vitals:   06/03/22 0630 06/03/22 0700 06/03/22 0832 06/03/22 0900  BP: 131/79   124/71  Pulse: 100   95  Resp: 18   18  Temp:  97.8 F (36.6 C)    TempSrc:  Oral    SpO2: 90%  94% 92%  Weight:      Height:        Intake/Output Summary (Last 24 hours) at 06/03/2022 1013 Last data filed at 06/03/2022 0046 Gross per 24 hour  Intake 46.07 ml  Output --  Net 46.07 ml   Filed Weights   06/02/22 1531  Weight: 80.4 kg     Exam General: Alert and oriented x 3, NAD Cardiovascular: S1 S2 auscultated, no murmurs, RRR Respiratory: scattered wheezing heard. On 2lito f [place] oxygen.  Gastrointestinal: Soft, nontender, nondistended, + bowel sounds Ext: no pedal edema bilaterally Neuro: AAOx3, Cr N's II- XII. Strength 5/5 upper  and lower extremities bilaterally Skin: No rashes Psych: Normal affect and demeanor, alert and oriented x3    Data Reviewed:  I have personally reviewed following labs and imaging studies   CBC Lab Results  Component Value Date   WBC 16.7 (H) 06/03/2022   RBC 3.83 (L) 06/03/2022   HGB 10.6 (L) 06/03/2022   HCT 33.2 (L) 06/03/2022   MCV 86.7 06/03/2022   MCH 27.7 06/03/2022   PLT 129 (L) 06/03/2022   MCHC 31.9 06/03/2022   RDW 15.7 (H) 06/03/2022   LYMPHSABS 0.4 (L) 06/03/2022   MONOABS 0.4 06/03/2022   EOSABS 0.0 06/03/2022   BASOSABS 0.0 14/43/1540     Last metabolic panel Lab Results  Component Value Date   NA 135 06/03/2022   K 3.9 06/03/2022   CL 103 06/03/2022   CO2 24 06/03/2022   BUN 26 (H) 06/03/2022   CREATININE 1.71 (H) 06/03/2022   GLUCOSE 156 (H) 06/03/2022   GFRNONAA 39 (L) 06/03/2022   GFRAA 42 (L) 05/23/2020   CALCIUM 8.5 (L) 06/03/2022   PHOS 3.7 06/03/2022   PROT 6.8 06/03/2022   ALBUMIN 3.0 (L) 06/03/2022   LABGLOB 3.6 12/14/2020   AGRATIO 0.8 12/14/2020   BILITOT 0.7 06/03/2022   ALKPHOS 58 06/03/2022   AST 12 (L) 06/03/2022   ALT 11 06/03/2022   ANIONGAP 8 06/03/2022    CBG (last 3)  No results for input(s): "GLUCAP" in the last 72 hours.    Coagulation Profile: Recent Labs  Lab 06/03/22 0521  INR 1.3*     Radiology Studies: DG Chest 2 View  Result Date: 06/02/2022 CLINICAL DATA:  Shortness of breath, history of congestive heart failure, bronchitis, former smoker EXAM: CHEST - 2 VIEW COMPARISON:  Multiple chest x-rays, most recently May 31, 2022, chest CT March 12, 2022 FINDINGS: Stable cardiomediastinal contours status post median sternotomy and CABG. Similar appearance of the heterogeneous opacities in the right apex and right lower lobe, consistent with known fibrosis. Similar appearance of chronic large right pleural effusion. No new focal pulmonary opacity. No large pneumothorax. No acute osseous abnormality. Partially  visualized vascular stent in the upper mid abdomen. IMPRESSION: Overall similar appearance of the chronic opacities right lung and pleural effusion. No acute cardiopulmonary abnormality Electronically Signed  By: Beryle Flock M.D.   On: 06/02/2022 16:05       Hosie Poisson M.D. [facility] 06/03/2022, 10:13 AM  Available via Epic secure chat 7am-7pm After 7 pm, please refer to night coverage provider listed on amion.

## 2022-06-04 LAB — BASIC METABOLIC PANEL
Anion gap: 9 (ref 5–15)
BUN: 41 mg/dL — ABNORMAL HIGH (ref 8–23)
CO2: 24 mmol/L (ref 22–32)
Calcium: 8.6 mg/dL — ABNORMAL LOW (ref 8.9–10.3)
Chloride: 101 mmol/L (ref 98–111)
Creatinine, Ser: 1.86 mg/dL — ABNORMAL HIGH (ref 0.61–1.24)
GFR, Estimated: 36 mL/min — ABNORMAL LOW (ref >60)
Glucose, Bld: 208 mg/dL — ABNORMAL HIGH (ref 70–99)
Potassium: 3.8 mmol/L (ref 3.5–5.1)
Sodium: 134 mmol/L — ABNORMAL LOW (ref 135–145)

## 2022-06-04 MED ORDER — SENNOSIDES-DOCUSATE SODIUM 8.6-50 MG PO TABS
2.0000 | ORAL_TABLET | Freq: Two times a day (BID) | ORAL | Status: DC
  Administered 2022-06-04 – 2022-06-06 (×3): 2 via ORAL
  Filled 2022-06-04 (×6): qty 2

## 2022-06-04 MED ORDER — IPRATROPIUM-ALBUTEROL 0.5-2.5 (3) MG/3ML IN SOLN
3.0000 mL | Freq: Three times a day (TID) | RESPIRATORY_TRACT | Status: DC
  Administered 2022-06-05 – 2022-06-07 (×7): 3 mL via RESPIRATORY_TRACT
  Filled 2022-06-04 (×7): qty 3

## 2022-06-04 MED ORDER — PREDNISONE 50 MG PO TABS
60.0000 mg | ORAL_TABLET | Freq: Every day | ORAL | Status: AC
  Administered 2022-06-05 – 2022-06-07 (×3): 60 mg via ORAL
  Filled 2022-06-04 (×3): qty 1

## 2022-06-04 MED ORDER — POLYETHYLENE GLYCOL 3350 17 G PO PACK
17.0000 g | PACK | Freq: Every day | ORAL | Status: DC
  Filled 2022-06-04 (×3): qty 1

## 2022-06-04 MED ORDER — SALINE SPRAY 0.65 % NA SOLN: 1.0000 | NASAL | Status: DC | PRN

## 2022-06-04 MED ORDER — BISACODYL 10 MG RE SUPP: 10.0000 mg | Freq: Every day | RECTAL | Status: DC | PRN

## 2022-06-04 NOTE — Evaluation (Signed)
Occupational Therapy Evaluation Patient Details [name]. MRN: [id number] DOB: 1941-03-19 Today's Date: 06/04/2022   History of Present Illness Patient is a 81 year old male who presented on 10/8 with shortness of breath. patient was admitted with suspected acute COPD exacerbation. PMH:AAA, HTN, RBBB, COPD, CAD, MI, L THA, CABG   Clinical Impression   Patient evaluated by Occupational Therapy with no further acute OT needs identified. All education has been completed and the patient has no further questions. Patient was MI for ADLs with O2 dropping to 85% on RA with activity with patient needing increased time and cues for deep breathing to deep breath back up to 91% on RA within 1 min. Nurse made aware.  See below for any follow-up Occupational Therapy or equipment needs. OT is signing off. Thank you for this referral.       Recommendations for follow up therapy are one component of a multi-disciplinary discharge planning process, led by the attending physician.  Recommendations may be updated based on patient status, additional functional criteria and insurance authorization.   Follow Up Recommendations  No OT follow up    Assistance Recommended at Discharge PRN  Patient can return home with the following Assistance with cooking/housework    Functional Status Assessment  Patient has not had a recent decline in their functional status  Equipment Recommendations       Recommendations for Other Services       Precautions / Restrictions Precautions Precaution Comments: monitor O2 Restrictions Weight Bearing Restrictions: No      Mobility Bed Mobility                    Transfers Overall transfer level: Modified independent                        Balance Overall balance assessment: Modified Independent                                         ADL either performed or assessed with clinical judgement   ADL Overall ADL's :  Modified independent                                       General ADL Comments: patient was able to complete transfers, functional mobility, LB dressing tasks during session with MI. patient endorsed being at baseline for ADLs. patient O2 was noted to drop to 85% on RA with minimal activity in room with education on ECT and deep breathing provided. nurse made aware. patient's wife was present and verbalized understanding as well.     Vision Patient Visual Report: No change from baseline       Perception     Praxis      Pertinent Vitals/Pain Pain Assessment Pain Assessment: No/denies pain     Hand Dominance     Extremity/Trunk Assessment Upper Extremity Assessment Upper Extremity Assessment: Overall WFL for tasks assessed   Lower Extremity Assessment Lower Extremity Assessment: Defer to PT evaluation   Cervical / Trunk Assessment Cervical / Trunk Assessment: Normal   Communication Communication Communication: No difficulties   Cognition Arousal/Alertness: Awake/alert Behavior During Therapy: WFL for tasks assessed/performed Overall Cognitive Status: Within Functional Limits for tasks assessed  General Comments: wife was present as well. tangential at times. very plesant     General Comments       Exercises     Shoulder Instructions      Home Living Family/patient expects to be discharged to:: Private residence Living Arrangements: Spouse/significant other Available Help at Discharge: Family;Available 24 hours/day Type of Home: House Home Access: Stairs to enter [employer] of Steps: 1 Entrance Stairs-Rails: Right;Left Home Layout: One level     Bathroom Shower/Tub: Tub/shower unit         Home Equipment: None          Prior Functioning/Environment Prior Level of Function : Independent/Modified Independent                        OT Problem List: Cardiopulmonary  status limiting activity      OT Treatment/Interventions:      OT Goals(Current goals can be found in the care plan section) Acute Rehab OT Goals OT Goal Formulation: All assessment and education complete, DC therapy  OT Frequency:      Co-evaluation              AM-PAC OT "6 Clicks" Daily Activity     Outcome Measure Help from another person eating meals?: None Help from another person taking care of personal grooming?: None Help from another person toileting, which includes using toliet, bedpan, or urinal?: None Help from another person bathing (including washing, rinsing, drying)?: None Help from another person to put on and taking off regular upper body clothing?: None Help from another person to put on and taking off regular lower body clothing?: None 6 Click Score: 24   End of Session Nurse Communication: Mobility status  Activity Tolerance: Patient tolerated treatment well Patient left: in chair;with call bell/phone within reach;with family/visitor present  OT Visit Diagnosis: Unsteadiness on feet (R26.81)                Time: 1610-9604 OT Time Calculation (min): 33 min Charges:  OT General Charges $OT Visit: 1 Visit OT Evaluation $OT Eval Low Complexity: 1 Low OT Treatments $Self Care/Home Management : 8-22 mins  Rennie Plowman, MS Acute Rehabilitation Department Office# [phone]   Marcellina Millin 06/04/2022, 4:17 PM

## 2022-06-04 NOTE — Progress Notes (Signed)
Throughout shift, at rest sitting up in chair, pt's SpO2 91-96% on RA.  After exertion or talking for several minutes, SpO2 noticed to drop to 88-90% on RA, returning to >92% on RA after several minutes of deep breathing.  Patient encouraged to break up activity in order to not become hypoxic.  Angie Fava, RN

## 2022-06-04 NOTE — Progress Notes (Signed)
SpO2 95% on RA.  Angie Fava, RN

## 2022-06-04 NOTE — Progress Notes (Signed)
Nutrition Brief Note  RD consulted for nutritional assessment. Pt reports he is eating well, with good appetite, really enjoys the food here.  No weight loss noted per weight records.  Wt Readings from Last 15 Encounters:  06/02/22 80.4 kg  05/31/22 80.4 kg  03/28/22 78.8 kg  03/20/22 79.5 kg  03/19/22 79.4 kg  03/06/22 79.6 kg  02/18/22 81.2 kg  01/01/22 81.5 kg  12/05/21 83.3 kg  11/15/21 81.9 kg  11/09/21 82.3 kg  10/30/21 83 kg  10/05/21 81.7 kg  09/29/21 78.9 kg  09/18/21 81.2 kg    Body mass index is 25.43 kg/m. Patient meets criteria for normal based on current BMI.   Current diet order is regular, patient is consuming approximately 100% of meals at this time. Labs and medications reviewed.   No nutrition interventions warranted at this time. If nutrition issues arise, please consult RD.   Paul Bibles, MS, RD, LDN Inpatient Clinical Dietitian Contact information available via Amion

## 2022-06-04 NOTE — Progress Notes (Signed)
Mobility Specialist - Progress Note   06/04/22 1530  Mobility  Activity Ambulated independently in hallway  Level of Assistance Independent  Assistive Device None  Distance Ambulated (ft) 350 ft  Range of Motion/Exercises Active  Activity Response Tolerated well  $Mobility charge 1 Mobility   Pt was found on recliner chair and agreeable to ambulate. Had no complaints and at EOS returned to recliner chair with all necessities in reach.  Ferd Hibbs Mobility Specialist

## 2022-06-04 NOTE — Progress Notes (Signed)
[facility]                                                                               Paul Sandoval, is a 81 y.o. male, DOB - 04-04-1941, JXB:[id number] Admit date - 06/02/2022    Outpatient Primary MD for the patient is Coral Spikes, DO  LOS - 2  days    Brief summary   Paul Blades. is a 81 y.o. 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    Assessment & Plan    Assessment and Plan:  Acute respiratory failure with hypoxia secondary to a combination of acute copd exacerbation and mild acute on chronic combined CHF.  Covid and influenza PCR is negative.  Admit for IV steroids and IV lasix. On exam today, he looks better, weaned off oxygen. Wheezing has improved. Transition to oral prednisone in am.  [place] oxygen oxygen to keep sats greater than 90%.  Doxycycline for acute bronchitis.    Acute on chronic combined systolic and diastolic CHF.  BNP elevated at 906, CXR shows unchanged pleural effusions and chronic changes .  She was found to have large right pleural effusion. US thoracentesis ordered and fluid to be sent for analysis.  Monitor renal parameters while on IV lasix.  Continue with strict intake and output.     Stage 3 B CKD Creatinine appears to be at baseline. Creatinine stable at 1.8. UA is negative.     Mild anemia and thrombocytopenia:  Continue to monitor. Repeat cbc in am.     Mildly elevated troponins from demand ischemia from acute CHF.    Hyperlipidemia:  Resume statin.    Leukocytosis:  ? Infection vs steroids.    Estimated body mass index is 25.43 kg/m as calculated from the  following:   Height as of this encounter: 5\' 10"  (1.778 m).   Weight as of this encounter: 80.4 kg.  Code Status: full code.  DVT Prophylaxis:  SCDs Start: 06/02/22 2309   Level of Care: Level of care: Progressive Family Communication: none at bedside.   Disposition Plan:     Remains inpatient appropriate:  IV lasix, IV solumedrol.   Procedures:  None.   Consultants:   None.   Antimicrobials:   Anti-infectives (From admission, onward)    Start     Dose/Rate Route Frequency Ordered Stop   06/02/22 2315  doxycycline (VIBRAMYCIN) 100 mg in sodium chloride 0.9 % 250 mL IVPB       Note to Pharmacy: (In setting of acute copd exac)   100 mg 125 mL/hr over 120 Minutes Intravenous Every 12 hours 06/02/22 2312          Medications  Scheduled Meds:  carvedilol  18.75 mg Oral BID WC   clopidogrel  75 mg Oral Daily   dapagliflozin propanediol  10 mg Oral QAC breakfast   furosemide  20 mg Intravenous Daily  ipratropium-albuterol  3 mL Nebulization Q6H   methylPREDNISolone (SOLU-MEDROL) injection  80 mg Intravenous Q12H   mometasone-formoterol  2 puff Inhalation BID   pantoprazole  40 mg Oral Daily   polyethylene glycol  17 g Oral Daily   rosuvastatin  20 mg Oral Daily   sacubitril-valsartan  1 tablet Oral BID   senna-docusate  2 tablet Oral BID   spironolactone  25 mg Oral Daily   Continuous Infusions:  doxycycline (VIBRAMYCIN) IV 100 mg (06/04/22 1020)   PRN Meds:.acetaminophen **OR** acetaminophen, albuterol, bisacodyl, fluticasone, nitroGLYCERIN, mouth rinse, sodium chloride    Subjective:   Paul Sandoval was seen and examined today.  Weaned off oxygen.  No chest pain . Sob has improved.   Objective:   Vitals:   06/04/22 0414 06/04/22 0722 06/04/22 0745 06/04/22 0747  BP: (!) 92/48  (!) 114/58   Pulse: 79 81 85   Resp: 18 18 (!) 24   Temp: 98 F (36.7 C)  97.6 F (36.4 C)   TempSrc:   Oral   SpO2: 94% 94% 96% 95%  Weight:      Height:         Intake/Output Summary (Last 24 hours) at 06/04/2022 1055 Last data filed at 06/04/2022 0957 Gross per 24 hour  Intake 1470.89 ml  Output 1700 ml  Net -229.11 ml    Filed Weights   06/02/22 1531  Weight: 80.4 kg     Exam General exam: Appears calm and comfortable  Respiratory system: scattered wheezing heard, air entry fair. On RA.  Cardiovascular system: S1 & S2 heard, RRR. No JVD,  No pedal edema. Gastrointestinal system: Abdomen is nondistended, soft and nontender.  Normal bowel sounds heard. Central nervous system: Alert and oriented. No focal neurological deficits. Extremities: Symmetric 5 x 5 power. Skin: No rashes, lesions or ulcers Psychiatry:  Mood & affect appropriate.     Data Reviewed:  I have personally reviewed following labs and imaging studies   CBC Lab Results  Component Value Date   WBC 16.7 (H) 06/03/2022   RBC 3.83 (L) 06/03/2022   HGB 10.6 (L) 06/03/2022   HCT 33.2 (L) 06/03/2022   MCV 86.7 06/03/2022   MCH 27.7 06/03/2022   PLT 129 (L) 06/03/2022   MCHC 31.9 06/03/2022   RDW 15.7 (H) 06/03/2022   LYMPHSABS 0.4 (L) 06/03/2022   MONOABS 0.4 06/03/2022   EOSABS 0.0 06/03/2022   BASOSABS 0.0 75/17/0017     Last metabolic panel Lab Results  Component Value Date   NA 134 (L) 06/04/2022   K 3.8 06/04/2022   CL 101 06/04/2022   CO2 24 06/04/2022   BUN 41 (H) 06/04/2022   CREATININE 1.86 (H) 06/04/2022   GLUCOSE 208 (H) 06/04/2022   GFRNONAA 36 (L) 06/04/2022   GFRAA 42 (L) 05/23/2020   CALCIUM 8.6 (L) 06/04/2022   PHOS 3.7 06/03/2022   PROT 6.8 06/03/2022   ALBUMIN 3.0 (L) 06/03/2022   LABGLOB 3.6 12/14/2020   AGRATIO 0.8 12/14/2020   BILITOT 0.7 06/03/2022   ALKPHOS 58 06/03/2022   AST 12 (L) 06/03/2022   ALT 11 06/03/2022   ANIONGAP 9 06/04/2022    CBG (last 3)  No results for input(s): "GLUCAP" in the last 72 hours.    Coagulation Profile: Recent Labs  Lab 06/03/22 0521  INR 1.3*      Radiology Studies: DG  Chest 2 View  Result Date: 06/02/2022 CLINICAL DATA:  Shortness of breath, history of congestive heart failure, bronchitis, former  smoker EXAM: CHEST - 2 VIEW COMPARISON:  Multiple chest x-rays, most recently May 31, 2022, chest CT March 12, 2022 FINDINGS: Stable cardiomediastinal contours status post median sternotomy and CABG. Similar appearance of the heterogeneous opacities in the right apex and right lower lobe, consistent with known fibrosis. Similar appearance of chronic large right pleural effusion. No new focal pulmonary opacity. No large pneumothorax. No acute osseous abnormality. Partially visualized vascular stent in the upper mid abdomen. IMPRESSION: Overall similar appearance of the chronic opacities right lung and pleural effusion. No acute cardiopulmonary abnormality Electronically Signed   By: Beryle Flock M.D.   On: 06/02/2022 16:05       Hosie Poisson M.D. [facility] 06/04/2022, 10:55 AM  Available via Epic secure chat 7am-7pm After 7 pm, please refer to night coverage provider listed on amion.

## 2022-06-05 ENCOUNTER — Inpatient Hospital Stay (HOSPITAL_COMMUNITY): Payer: Medicare Other

## 2022-06-05 DIAGNOSIS — Z515 Encounter for palliative care: Secondary | ICD-10-CM

## 2022-06-05 DIAGNOSIS — J441 Chronic obstructive pulmonary disease with (acute) exacerbation: Secondary | ICD-10-CM | POA: Diagnosis not present

## 2022-06-05 DIAGNOSIS — J9601 Acute respiratory failure with hypoxia: Secondary | ICD-10-CM | POA: Diagnosis not present

## 2022-06-05 DIAGNOSIS — Z7189 Other specified counseling: Secondary | ICD-10-CM

## 2022-06-05 LAB — BASIC METABOLIC PANEL
Anion gap: 8 (ref 5–15)
BUN: 46 mg/dL — ABNORMAL HIGH (ref 8–23)
CO2: 22 mmol/L (ref 22–32)
Calcium: 8.3 mg/dL — ABNORMAL LOW (ref 8.9–10.3)
Chloride: 105 mmol/L (ref 98–111)
Creatinine, Ser: 1.72 mg/dL — ABNORMAL HIGH (ref 0.61–1.24)
GFR, Estimated: 39 mL/min — ABNORMAL LOW (ref >60)
Glucose, Bld: 139 mg/dL — ABNORMAL HIGH (ref 70–99)
Potassium: 3.7 mmol/L (ref 3.5–5.1)
Sodium: 135 mmol/L (ref 135–145)

## 2022-06-05 LAB — CBC WITH DIFFERENTIAL/PLATELET
Abs Immature Granulocytes: 0.15 10*3/uL — ABNORMAL HIGH (ref 0.00–0.07)
Basophils Absolute: 0 10*3/uL (ref 0.0–0.1)
Basophils Relative: 0 %
Eosinophils Absolute: 0 10*3/uL (ref 0.0–0.5)
Eosinophils Relative: 0 %
HCT: 32.3 % — ABNORMAL LOW (ref 39.0–52.0)
Hemoglobin: 10.1 g/dL — ABNORMAL LOW (ref 13.0–17.0)
Immature Granulocytes: 1 %
Lymphocytes Relative: 3 %
Lymphs Abs: 0.5 10*3/uL — ABNORMAL LOW (ref 0.7–4.0)
MCH: 27.1 pg (ref 26.0–34.0)
MCHC: 31.3 g/dL (ref 30.0–36.0)
MCV: 86.6 fL (ref 80.0–100.0)
Monocytes Absolute: 0.5 10*3/uL (ref 0.1–1.0)
Monocytes Relative: 3 %
Neutro Abs: 14.9 10*3/uL — ABNORMAL HIGH (ref 1.7–7.7)
Neutrophils Relative %: 93 %
Platelets: 172 10*3/uL (ref 150–400)
RBC: 3.73 MIL/uL — ABNORMAL LOW (ref 4.22–5.81)
RDW: 16.2 % — ABNORMAL HIGH (ref 11.5–15.5)
WBC: 16.1 10*3/uL — ABNORMAL HIGH (ref 4.0–10.5)
nRBC: 0 % (ref 0.0–0.2)

## 2022-06-05 MED ORDER — FUROSEMIDE 10 MG/ML IJ SOLN
20.0000 mg | Freq: Every day | INTRAMUSCULAR | Status: DC
  Administered 2022-06-05: 20 mg via INTRAVENOUS
  Filled 2022-06-05: qty 2

## 2022-06-05 MED ORDER — DOXYCYCLINE HYCLATE 100 MG PO TABS
100.0000 mg | ORAL_TABLET | Freq: Two times a day (BID) | ORAL | Status: DC
  Administered 2022-06-05 – 2022-06-07 (×5): 100 mg via ORAL
  Filled 2022-06-05 (×5): qty 1

## 2022-06-05 MED ORDER — LIDOCAINE HCL 1 % IJ SOLN
INTRAMUSCULAR | Status: AC
  Filled 2022-06-05: qty 20

## 2022-06-05 NOTE — TOC CM/SW Note (Signed)
  Transition of Care [facility]) Screening Note   Patient Details  [name]. Date of Birth: October 23, 1940   Transition of Care [facility]) CM/SW Contact:    Ross Ludwig, LCSW Phone Number: 06/05/2022, 6:09 PM    Transition of Care Department [facility]) has reviewed patient and no TOC needs have been identified at this time. We will continue to monitor patient advancement through interdisciplinary progression rounds. If new patient transition needs arise, please place a TOC consult.

## 2022-06-05 NOTE — Progress Notes (Signed)
Pt was brought to [place] for diagnostic thoracentesis. Upon US examination, there was no fluid on L and a very small fluid pocket on R that was not large enough to safely access.  Exam was ended and explained to patient.  Patient was in agreement with findings.   Care team made aware.     Narda Rutherford, AGNP-BC 06/05/2022, 3:11 PM

## 2022-06-05 NOTE — Evaluation (Signed)
Physical Therapy Evaluation-1x Patient Details [name]. MRN: [id number] DOB: [birth year] Today's Date: 06/05/2022    SATURATION QUALIFICATIONS: (This note is used to comply with regulatory documentation for home oxygen)  Patient Saturations on Room Air at Rest = 93%  Patient Saturations on Room Air while Ambulating = 87%    History of Present Illness  Patient is a 81 year old male who presented on 10/8 with shortness of breath. patient was admitted with suspected acute COPD exacerbation. PMH:AAA, HTN, RBBB, COPD, CAD, MI, L THA, CABG  Clinical Impression  On eval, pt was Mod Ind with mobility. Pt tolerated session well. Able to have conversation throughout the entire walk. O2 did drop to 87% though. With seated rest and deep breaths, O2 back up to 91% on RA end of session. No acute PT needs. Continued mobility and O2 assessment can be performed by mobility team and/or nursing staff. 1x eval. Will sign off.      Recommendations for follow up therapy are one component of a multi-disciplinary discharge planning process, led by the attending physician.  Recommendations may be updated based on patient status, additional functional criteria and insurance authorization.  Follow Up Recommendations No PT follow up      Assistance Recommended at Discharge None  Patient can return home with the following       Equipment Recommendations None recommended by PT  Recommendations for Other Services       Functional Status Assessment Patient has had a recent decline in their functional status and demonstrates the ability to make significant improvements in function in a reasonable and predictable amount of time.     Precautions / Restrictions Precautions Precaution Comments: monitor O2 Restrictions Weight Bearing Restrictions: No      Mobility  Bed Mobility               General bed mobility comments: oob in recliner    Transfers Overall transfer level:  Modified independent                      Ambulation/Gait Ambulation/Gait assistance: Modified independent (Device/Increase time) Gait Distance (Feet): 500 Feet Assistive device: None Gait Pattern/deviations: WFL(Within Functional Limits)          Stairs            Wheelchair Mobility    Modified Rankin (Stroke Patients Only)       Balance Overall balance assessment: Modified Independent                                           Pertinent Vitals/Pain Pain Assessment Pain Assessment: No/denies pain    Home Living Family/patient expects to be discharged to:: Private residence Living Arrangements: Spouse/significant other Available Help at Discharge: Family;Available 24 hours/day Type of Home: House Home Access: Stairs to enter Entrance Stairs-Rails: Psychiatric nurse of Steps: 1   Home Layout: One level Home Equipment: None      Prior Function Prior Level of Function : Independent/Modified Independent                     Hand Dominance        Extremity/Trunk Assessment   Upper Extremity Assessment Upper Extremity Assessment: Overall WFL for tasks assessed    Lower Extremity Assessment Lower Extremity Assessment: Overall WFL for tasks assessed    Cervical /  Trunk Assessment Cervical / Trunk Assessment: Normal  Communication   Communication: No difficulties  Cognition Arousal/Alertness: Awake/alert Behavior During Therapy: WFL for tasks assessed/performed Overall Cognitive Status: Within Functional Limits for tasks assessed                                          General Comments      Exercises     Assessment/Plan    PT Assessment Patient does not need any further PT services  PT Problem List         PT Treatment Interventions      PT Goals (Current goals can be found in the Care Plan section)  Acute Rehab PT Goals Patient Stated Goal: home soon PT Goal  Formulation: All assessment and education complete, DC therapy    Frequency       Co-evaluation               AM-PAC PT "6 Clicks" Mobility  Outcome Measure Help needed turning from your back to your side while in a flat bed without using bedrails?: None Help needed moving from lying on your back to sitting on the side of a flat bed without using bedrails?: None Help needed moving to and from a bed to a chair (including a wheelchair)?: None Help needed standing up from a chair using your arms (e.g., wheelchair or bedside chair)?: None Help needed to walk in hospital room?: None Help needed climbing 3-5 steps with a railing? : None 6 Click Score: 24    End of Session   Activity Tolerance: Patient tolerated treatment well Patient left: in chair;with call bell/phone within reach;with family/visitor present        Time: 1100-1110 PT Time Calculation (min) (ACUTE ONLY): 10 min   Charges:   PT Evaluation $PT Eval Low Complexity: [place], PT Acute Rehabilitation  Office: [phone] Pager: [phone]

## 2022-06-05 NOTE — Progress Notes (Signed)
[facility]                                                                               Paul Sandoval, is a 81 y.o. male, DOB - 1940/11/20, HYW:[id number] Admit date - 06/02/2022    Outpatient Primary MD for the patient is Coral Spikes, DO  LOS - 3  days    Brief summary   Paul Sandoval. is a 81 y.o. 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    Assessment & Plan    Assessment and Plan:  Acute respiratory failure with hypoxia secondary to a combination of acute copd exacerbation and mild acute on chronic combined CHF.  Covid and influenza PCR is negative.  Admit for IV steroids and IV lasix. On exam today, he looks better, weaned off oxygen. Wheezing has improved. Transitioned to oral prednisone, plan for a quick taper.  Please check ambulating oxygen levels tomorrow to see if he requires oxygen on discharge.  Doxycycline for acute bronchitis.    Acute on chronic combined systolic and diastolic CHF.  Diuresis with IV lasix.  BNP elevated at 906, CXR shows unchanged pleural effusions and chronic changes .  US thoracentesis ordered, no fluid on the left and very small fluid pocket on the right, not enough for analysis.  Monitor renal parameters while on IV lasix.  Continue with strict intake and output.     Stage 3 B CKD Creatinine appears to be at baseline. Creatinine stable at 1.8. UA is negative.     Mild anemia and thrombocytopenia:  Platelet count improved.  Hemoglobin stable at 10.     Mildly elevated troponins from demand ischemia from acute CHF.    Hyperlipidemia:  Resume statin.     Leukocytosis:  ? Infection vs steroids.    Estimated body mass index is 25.43 kg/m as calculated from the following:   Height as of this encounter: 5\' 10"  (1.778 m).   Weight as of this encounter: 80.4 kg.  Code Status: full code.  DVT Prophylaxis:  SCDs Start: 06/02/22 2309   Level of Care: Level of care: Progressive Family Communication: none at bedside.   Disposition Plan:     Remains inpatient appropriate:  IV lasix, , possible discharge in am.   Procedures:  None.   Consultants:   None.   Antimicrobials:   Anti-infectives (From admission, onward)    Start     Dose/Rate Route Frequency Ordered Stop   06/05/22 1030  doxycycline (VIBRA-TABS) tablet 100 mg        100 mg Oral Every 12 hours 06/05/22 0932     06/02/22 2315  doxycycline (VIBRAMYCIN) 100 mg in sodium chloride 0.9 % 250 mL IVPB  Status:  Discontinued       Note to Pharmacy: (In setting of acute copd exac)   100 mg 125 mL/hr over 120 Minutes Intravenous Every 12 hours  06/02/22 2312 06/05/22 0932        Medications  Scheduled Meds:  carvedilol  18.75 mg Oral BID WC   clopidogrel  75 mg Oral Daily   dapagliflozin propanediol  10 mg Oral QAC breakfast   doxycycline  100 mg Oral Q12H   ipratropium-albuterol  3 mL Nebulization TID   lidocaine       mometasone-formoterol  2 puff Inhalation BID   pantoprazole  40 mg Oral Daily   polyethylene glycol  17 g Oral Daily   predniSONE  60 mg Oral QAC breakfast   rosuvastatin  20 mg Oral Daily   sacubitril-valsartan  1 tablet Oral BID   senna-docusate  2 tablet Oral BID   spironolactone  25 mg Oral Daily   Continuous Infusions:   PRN Meds:.acetaminophen **OR** acetaminophen, albuterol, bisacodyl, fluticasone, lidocaine, nitroGLYCERIN, mouth rinse, sodium chloride    Subjective:   Paul Sandoval was seen and examined today.  At rest patient is on RA. On ambulation oxygen sats dipped to 87% Recommend rechecking oxygen sats on ambulating tomorrow.   Patient still coughing while talking.   Objective:   Vitals:   06/05/22 0426 06/05/22 1000 06/05/22 1005 06/05/22 1009  BP: (!) 99/50 (!) 91/49  (!) 91/49  Pulse: 87   83  Resp: 18     Temp:      TempSrc:      SpO2: 92%  94% 97%  Weight:      Height:        Intake/Output Summary (Last 24 hours) at 06/05/2022 1445 Last data filed at 06/05/2022 0900 Gross per 24 hour  Intake 1696.46 ml  Output 1700 ml  Net -3.54 ml    Filed Weights   06/02/22 1531  Weight: 80.4 kg     Exam General exam: Appears calm and comfortable  Respiratory system: no wheezing heard on exam, air entry fair.  Cardiovascular system: S1 & S2 heard, RRR. No JVD,  No pedal edema. Gastrointestinal system: Abdomen is nondistended, soft and nontender. Normal bowel sounds heard. Central nervous system: Alert and oriented. No focal neurological deficits. Extremities: Symmetric 5 x 5 power. Skin: No rashes, lesions or ulcers Psychiatry:  Mood & affect appropriate.      Data Reviewed:  I have personally reviewed following labs and imaging studies   CBC Lab Results  Component Value Date   WBC 16.1 (H) 06/05/2022   RBC 3.73 (L) 06/05/2022   HGB 10.1 (L) 06/05/2022   HCT 32.3 (L) 06/05/2022   MCV 86.6 06/05/2022   MCH 27.1 06/05/2022   PLT 172 06/05/2022   MCHC 31.3 06/05/2022   RDW 16.2 (H) 06/05/2022   LYMPHSABS 0.5 (L) 06/05/2022   MONOABS 0.5 06/05/2022   EOSABS 0.0 06/05/2022   BASOSABS 0.0 40/34/7425     Last metabolic panel Lab Results  Component Value Date   NA 135 06/05/2022   K 3.7 06/05/2022   CL 105 06/05/2022   CO2 22 06/05/2022   BUN 46 (H) 06/05/2022   CREATININE 1.72 (H) 06/05/2022   GLUCOSE 139 (H) 06/05/2022   GFRNONAA 39 (L) 06/05/2022   GFRAA 42 (L) 05/23/2020   CALCIUM 8.3 (L) 06/05/2022   PHOS 3.7 06/03/2022   PROT 6.8 06/03/2022   ALBUMIN 3.0 (L) 06/03/2022   LABGLOB 3.6 12/14/2020   AGRATIO 0.8 12/14/2020   BILITOT 0.7 06/03/2022   ALKPHOS 58 06/03/2022    AST 12 (L) 06/03/2022   ALT 11 06/03/2022   ANIONGAP 8 06/05/2022  CBG (last 3)  No results for input(s): "GLUCAP" in the last 72 hours.    Coagulation Profile: Recent Labs  Lab 06/03/22 0521  INR 1.3*      Radiology Studies: No results found.     Hosie Poisson M.D. [facility] 06/05/2022, 2:45 PM  Available via Epic secure chat 7am-7pm After 7 pm, please refer to night coverage provider listed on amion.

## 2022-06-05 NOTE — Progress Notes (Signed)
Mobility Specialist - Progress Note   06/05/22 1416  Mobility  Activity Ambulated independently in hallway  Level of Assistance Independent  Assistive Device None  Distance Ambulated (ft) 350 ft  Range of Motion/Exercises Active  Activity Response Tolerated well  $Mobility charge 1 Mobility   Pt was found on recliner chair and agreeable to ambulate. Had no complaints and at EOS returned to recliner chair with all necessities in reach.   Ferd Hibbs Mobility Specialist

## 2022-06-05 NOTE — Consult Note (Addendum)
Consultation Note Date: 06/05/2022   Patient [name].  DOB: 1941/05/25  MRN: [id number]  Age / Sex: 81 y.o., male   PCP: Coral Spikes, DO Referring Physician: Hosie Poisson, MD  Reason for Consultation: Establishing goals of care     Chief Complaint/History of Present Illness:  Paul "Rush Sandoval" Swim is an 81 year old male who presented to [facility] Emergency Department on 06/02/2022 with complaints of shortness of breath. Medical history of COPD, Systolic/Diastolic Heart Failure, CKD 3B, hyperlipidemia, CABG (1996),  and EF of 30-35%. History of lung cancer and is followed by Dr. Julien Nordmann; receives routine CT scans to monitor. Since being admitted to the hospital, patient has been managed for COPD exacerbation and fluid overload. BNP on 06/03/22 was 906.8 and troponin mildly elevated. COVID and Influenza were negative. Patient treated with IV steroids, IV Lasix, and Doxycycline. Oxygen utilized to keep oxygen saturations greater than 90%.  Performed extensive review of EMR prior to seeing patient today.  Discussed care with bedside RN and PT.  Presented to bedside; patient observed sitting up in recliner chair and smiling. He reports being pleased with the care that he has received and "feeling much better." Observed alert and oriented to all spheres. Pleasant affect and good historian. On room air at the time of this visit and oxygen saturations at 92% with conversation. Dyspnea noted after 5-10 minutes of conversation and some audible wheezing. When asked how patient feels about his breathing this morning, he reports that it is not back to baseline but much improved.  Patient monitors his blood pressure and oxygen saturations routinely at home and noticed that something was "off." Reports that low oxygen saturations were what prompted him to come into the hospital; usual daily readings are 94% but had decreased to 88%. He was also concerned about expectorating  greenish-yellow mucous. This morning mucous is back to baseline white. Denies home oxygen use.  Patient is a veteran and, while proud of his service, verbalizes that it likely contributed to his current respiratory issues. Reports marked decline since 2017 and being vigilant about his healthcare since that time. Manages his disease processes through the pacing of activities. States that he no longer plays golf "because going up and down the hills takes too much energy." Instead, patient devotes his energy to household maintenance activities such as "grocery shopping" and "pumping the gas." Reports that his Darrick Meigs faith has been a tremendous source of strength. States, "My prayer to the man upstairs is to help me help myself."   Support system includes wife, Paul Sandoval, of nearly 53 years and son, Paul Sandoval. Patient has designated wife, Paul Sandoval, to speak for him regarding medical care if he is unable to speak for himself. No ACP documents on file and patient declines assistance creating documents. Patient utilizes [place] benefits to obtain medications; reports preference of seeing physicians within the [place]. When asked about the support that is needed upon return home, patient denies the need for any additional supports. States that he is aware of the many supports that [place] provides, such as transportation, and will utilize those services if and when needed. Palliative Care services explained to patient and wife. Patient verbalizes understanding and states that he will utilize palliative services if and when needed.  Primary Diagnoses  Present on Admission:  Acute exacerbation of chronic obstructive pulmonary disease (COPD) ([place])  Acute respiratory failure with hypoxia (HCC)  SIRS (systemic inflammatory response syndrome) (HCC)  Chronic combined systolic (congestive) and  diastolic (congestive) heart failure (HCC)  HLD (hyperlipidemia)  Stage 3b chronic kidney disease (CKD) (HCC)  Anemia of chronic  kidney failure   Palliative Review of Systems: Patient has a productive cough, wheezing, and exertional dyspnea. Denies other symptoms of concern at this time.  I have reviewed the medical record, interviewed the patient and family, and examined the patient. The following aspects are pertinent.  Past Medical History:  Diagnosis Date   AAA (abdominal aortic aneurysm) ([place]) 2010   4.4 cm 08/2008;4.44 in 7/10 and 4.65 in 08/2009; 4.8 by CT in 11/2009; 4.3 by ultrasound in 08/2010   Anemia    Arteriosclerotic cardiovascular disease (ASCVD) 1996   CABG-1996   Arthritis    "fingers" (03/18/2014)   CAD (coronary artery disease)    03/18/14:  PCI with DES to distal left main. 7/29: DES to the SVG to Diag   Cancer [facility])    Upper right lobe lung cancer   Cardiomyopathy, ischemic    Echo 03/17/14: EF 45-50%   CHF (congestive heart failure) (HCC)    Chronic bronchitis (HCC)    Chronic kidney disease    CRF   Chronic rhinitis    Colonic polyp 2002   polypectomy in 2002   COPD (chronic obstructive pulmonary disease) (HCC)    Diverticulosis    Dyspnea    with exertion   ED (erectile dysfunction)    Encounter for antineoplastic chemotherapy 12/19/2015   GERD (gastroesophageal reflux disease)    History of blood transfusion    Hyperlipidemia    Hypertension    IFG (impaired fasting glucose)    Myocardial infarction [facility])    "told h/o silent MI sometime before 1996"   Pneumonia ~ 2001; ~ 2005    has had more than twice   Right bundle branch block    Tobacco abuse, in remission    40 pack year total consumption; discontinued in 1996   Social History   Socioeconomic History   Marital status: Married    Spouse name: Ann   Number of children: 1   Years of education: Not on file   Highest education level: Not on file  Occupational History   Occupation: Retired    Comment: [employer]  Tobacco Use   Smoking status: Former    Packs/day: 1.50    Years: 30.00    Total pack  years: 45.00    Types: Cigarettes    Start date: 12/01/1956    Quit date: 01/08/1995    Years since quitting: 27.4   Smokeless tobacco: Never  Vaping Use   Vaping Use: Never used  Substance and Sexual Activity   Alcohol use: No    Alcohol/week: 0.0 standard drinks of alcohol    Comment: 03/18/2014 "no alacohol since 1996"   Drug use: No   Sexual activity: Never  Other Topics Concern   Not on file  Social History Narrative   Married since 09/18/1964.    1 son Paul Sandoval. No grandchildren.   Social Determinants of Health   Financial Resource Strain: Low Risk  (08/08/2021)   Overall Financial Resource Strain (CARDIA)    Difficulty of Paying Living Expenses: Not hard at all  Food Insecurity: No Food Insecurity (06/03/2022)   Hunger Vital Sign    Worried About Running Out of Food in the Last Year: Never true    Ran Out of Food in the Last Year: Never true  Transportation Needs: No Transportation Needs (06/03/2022)   PRAPARE - Transportation    Lack  of Transportation (Medical): No    Lack of Transportation (Non-Medical): No  Physical Activity: Insufficiently Active (08/08/2021)   Exercise Vital Sign    Days of Exercise per Week: 3 days    Minutes of Exercise per Session: 20 min  Stress: No Stress Concern Present (08/08/2021)   [place]    Feeling of Stress : Not at all  Social Connections: [place] (08/08/2021)   Social Connection and Isolation Panel [NHANES]    Frequency of Communication with Friends and Family: More than three times a week    Frequency of Social Gatherings with Friends and Family: Three times a week    Attends Religious Services: More than 4 times per year    Active Member of Clubs or Organizations: Yes    Attends Archivist Meetings: More than 4 times per year    Marital Status: Married   Family History  Problem Relation Age of Onset   Heart disease Father    Cancer Father         Lung   Arthritis Mother    Parkinsonism Mother    Arthritis Sister        Brother with rheumatoid arthritis   Hypertension Brother    Colon cancer Neg Hx    Colon polyps Neg Hx    Scheduled Meds:  carvedilol  18.75 mg Oral BID WC   clopidogrel  75 mg Oral Daily   dapagliflozin propanediol  10 mg Oral QAC breakfast   doxycycline  100 mg Oral Q12H   ipratropium-albuterol  3 mL Nebulization TID   mometasone-formoterol  2 puff Inhalation BID   pantoprazole  40 mg Oral Daily   polyethylene glycol  17 g Oral Daily   predniSONE  60 mg Oral QAC breakfast   rosuvastatin  20 mg Oral Daily   sacubitril-valsartan  1 tablet Oral BID   senna-docusate  2 tablet Oral BID   spironolactone  25 mg Oral Daily   Continuous Infusions: PRN Meds:.acetaminophen **OR** acetaminophen, albuterol, bisacodyl, fluticasone, nitroGLYCERIN, mouth rinse, sodium chloride Allergies  Allergen Reactions   Neomycin Hives   Anoro Ellipta [Umeclidinium-Vilanterol] Itching    Rash over face, neck   Stiolto Respimat [Tiotropium Bromide-Olodaterol] Itching    Rash over face , neck   Cetirizine & Related Rash   CBC:    Component Value Date/Time   WBC 16.1 (H) 06/05/2022 0355   HGB 10.1 (L) 06/05/2022 0355   HGB 11.7 (L) 03/12/2022 0837   HGB 11.9 (L) 05/23/2020 0821   HGB 11.1 (L) 05/21/2017 0906   HCT 32.3 (L) 06/05/2022 0355   HCT 37.0 (L) 05/23/2020 0821   HCT 36.0 (L) 05/21/2017 0906   PLT 172 06/05/2022 0355   PLT 123 (L) 03/12/2022 0837   PLT 98 (L) 05/21/2017 0906   PLT 138 (L) 02/15/2016 0843   MCV 86.6 06/05/2022 0355   MCV 89 05/23/2020 0821   MCV 86.5 05/21/2017 0906   NEUTROABS 14.9 (H) 06/05/2022 0355   NEUTROABS 11.2 (H) 05/23/2020 0821   NEUTROABS 5.3 05/21/2017 0906   LYMPHSABS 0.5 (L) 06/05/2022 0355   LYMPHSABS 1.1 05/23/2020 0821   LYMPHSABS 0.8 (L) 05/21/2017 0906   MONOABS 0.5 06/05/2022 0355   MONOABS 0.8 05/21/2017 0906   EOSABS 0.0 06/05/2022 0355   EOSABS 0.0  05/23/2020 0821   BASOSABS 0.0 06/05/2022 0355   BASOSABS 0.0 05/23/2020 0821   BASOSABS 0.0 05/21/2017 0906   Comprehensive Metabolic Panel:  Component Value Date/Time   NA 135 06/05/2022 0355   NA 140 05/23/2020 0821   NA 137 05/21/2017 0906   K 3.7 06/05/2022 0355   K 4.1 05/21/2017 0906   CL 105 06/05/2022 0355   CO2 22 06/05/2022 0355   CO2 29 05/21/2017 0906   BUN 46 (H) 06/05/2022 0355   BUN 28 (H) 05/23/2020 0821   BUN 14.6 05/21/2017 0906   CREATININE 1.72 (H) 06/05/2022 0355   CREATININE 1.75 (H) 03/12/2022 0837   CREATININE 1.2 05/21/2017 0906   GLUCOSE 139 (H) 06/05/2022 0355   GLUCOSE 95 05/21/2017 0906   CALCIUM 8.3 (L) 06/05/2022 0355   CALCIUM 9.4 05/21/2017 0906   AST 12 (L) 06/03/2022 0521   AST 12 (L) 03/12/2022 0837   AST 19 05/21/2017 0906   ALT 11 06/03/2022 0521   ALT 7 03/12/2022 0837   ALT 14 05/21/2017 0906   ALKPHOS 58 06/03/2022 0521   ALKPHOS 86 05/21/2017 0906   BILITOT 0.7 06/03/2022 0521   BILITOT 0.5 03/12/2022 0837   BILITOT 0.57 05/21/2017 0906   PROT 6.8 06/03/2022 0521   PROT 6.9 05/23/2020 0821   PROT 7.1 05/21/2017 0906   ALBUMIN 3.0 (L) 06/03/2022 0521   ALBUMIN 4.1 05/23/2020 0821   ALBUMIN 3.1 (L) 05/21/2017 0906    Physical Exam: Vital Signs: BP (!) 91/49   Pulse 83   Temp 97.7 F (36.5 C) (Oral)   Resp 18   Ht 5\' 10"  (1.778 m)   Wt 80.4 kg   SpO2 97%   BMI 25.43 kg/m  SpO2: SpO2: 97 % O2 Device: O2 Device: Room Air O2 Flow Rate: O2 Flow Rate (L/min): 2 L/min Intake/output summary:  Intake/Output Summary (Last 24 hours) at 06/05/2022 1110 Last data filed at 06/05/2022 0149 Gross per 24 hour  Intake 1996.46 ml  Output 1200 ml  Net 796.46 ml   LBM: Last BM Date : 06/05/22 Baseline Weight: Weight: 80.4 kg Most recent weight: Weight: 80.4 kg  General: NAD, alert, sitting up in chair at bedside Eyes: conjunctiva clear, anicteric sclera HENT: normocephalic, atraumatic, moist mucous  membranes Cardiovascular: RRR Respiratory: conversational dyspnea, audible wheezing Abdomen: soft, not tender, not distended Extremities: mild edema in LE b/l Skin: no rashes or lesions on visible skin Neuro: A&Ox4, following commands easily Psych: appropriately answers all questions  Palliative Performance Scale: 70%      Additional Data Reviewed: Recent Labs    06/03/22 0521 06/04/22 0935 06/05/22 0355  WBC 16.7*  --  16.1*  HGB 10.6*  --  10.1*  PLT 129*  --  172  NA 135 134* 135  BUN 26* 41* 46*  CREATININE 1.71* 1.86* 1.72*    Imaging: DG Chest 2 View CLINICAL DATA:  Shortness of breath, history of congestive heart failure, bronchitis, former smoker  EXAM: CHEST - 2 VIEW  COMPARISON:  Multiple chest x-rays, most recently May 31, 2022, chest CT March 12, 2022  FINDINGS: Stable cardiomediastinal contours status post median sternotomy and CABG.  Similar appearance of the heterogeneous opacities in the right apex and right lower lobe, consistent with known fibrosis. Similar appearance of chronic large right pleural effusion. No new focal pulmonary opacity. No large pneumothorax.  No acute osseous abnormality. Partially visualized vascular stent in the upper mid abdomen.  IMPRESSION: Overall similar appearance of the chronic opacities right lung and pleural effusion. No acute cardiopulmonary abnormality  Electronically Signed   By: Beryle Flock M.D.   On: 06/02/2022 16:05 DG Chest  2 View CLINICAL DATA:  COPD.  EXAM: CHEST - 2 VIEW  COMPARISON:  02/18/2022.  CT, 03/12/2022.  FINDINGS: Stable changes from prior CABG surgery. Cardiac silhouette normal in size. No mediastinal or hilar masses.  Stable opacity at the right apex consistent with scarring. Opacity at the right lung base is consistent with a chronic effusion and atelectasis or scarring. Bilateral thickened interstitial markings. No convincing pneumonia. No pulmonary edema.  No left  pleural effusion and no pneumothorax.  Skeletal structures are grossly intact.  IMPRESSION: 1. No acute cardiopulmonary disease. 2. Chronic right lung changes stable from the previous exam is s.  Electronically Signed   By: Lajean Manes M.D.   On: 06/02/2022 14:01   I personally review recent imaging.   Palliative Care Assessment and Plan Summary of Established Goals of Care and Medical Treatment Preferences   Deloy "Rush Sandoval" Duquette is an 81 year old male who presented to [facility] Emergency Department on 06/02/2022 with complaints of shortness of breath. Medical history of COPD, Systolic/Diastolic Heart Failure, CKD 3B, hyperlipidemia, CABG (1996),  and EF of 30-35%. History of lung cancer and is followed by Dr. Julien Nordmann; receives routine CT scans to monitor. Since being admitted to the hospital, patient has been managed for COPD exacerbation and fluid overload. Palliative care consulted to assist with goals of care.   # Complex medical decision making/goals of care  -Extensive conversation with patient as described above in HPI.  -Patient has designated wife, Paul Sandoval, to speak for him regarding medical decisions if he is unable to speak for himself.  -No ACP documents on file and patient declines assistance with documentation creation at this time.   -  Code Status: Full Code   # Symptom management  - COPD and CHF management as per hospitalist.  # Psycho-social/Spiritual Support:  - Support System: wife Paul Sandoval of nearly 63 years and son Paul Sandoval  -Did introduce palliative care team to wife when team re-presented to the room later in the day. - Desire for further Chaplain support: No - Patient's Darrick Meigs faith is a source of strength   # Discharge Planning:   Home  ; PT not recommending any further needs  Palliative care team appreciates involvement of care for Mr. Pagliarulo.  This time patient declines any completion of ACP documentation.  Established palliative care  or team's role in patient's care.  Please reach out if our team can be of further assistance at this time.  Patient's goals are to get back home with his wife.  Signed by: Moss Mc, RN MSN [facility] / NP Student Palliative Care Provider [phone]  I have reviewed, seen, examined, and discussed the care of this patient in detail with the student nurse practitioner Moss Mc including pertinent patient records, physical exam findings and data. I agree with details of this encounter and have updated accordingly (GC).   This provider spent a total of 81 minutes providing patient's care.  Includes review of EMR, discussing care with other staff members involved in patient's medical care, obtaining relevant history and information from patient and/or patient's family, and personal review of imaging and lab work. Greater than 50% of the time was spent counseling and coordinating care related to the above assessment and plan.    Chelsea Aus, DO Palliative Care Provider [phone]

## 2022-06-05 NOTE — Progress Notes (Signed)
PHARMACIST - PHYSICIAN COMMUNICATION DR:   Karleen Hampshire CONCERNING: Antibiotic IV to Oral Route Change Policy  RECOMMENDATION: This patient is receiving doxycycline by the intravenous route.  Based on criteria approved by the Pharmacy and Therapeutics Committee, the antibiotic(s) is/are being converted to the equivalent oral dose form(s).   DESCRIPTION: These criteria include: Patient being treated for a respiratory tract infection, urinary tract infection, cellulitis or clostridium difficile associated diarrhea if on metronidazole The patient is not neutropenic and does not exhibit a GI malabsorption state The patient is eating (either orally or via tube) and/or has been taking other orally administered medications for a least 24 hours The patient is improving clinically and has a Tmax < 100.5  If you have questions about this conversion, please contact the Pharmacy Department  []   [phone] )  [facility] []   [phone] )  [facility] []   [phone] )  [facility] []   [phone] )  [facility] [x]   [phone] )  [place], PharmD, BCPS 06/05/2022 9:33 AM

## 2022-06-06 ENCOUNTER — Other Ambulatory Visit: Payer: Self-pay | Admitting: *Deleted

## 2022-06-06 DIAGNOSIS — Z95828 Presence of other vascular implants and grafts: Secondary | ICD-10-CM

## 2022-06-06 DIAGNOSIS — I5033 Acute on chronic diastolic (congestive) heart failure: Secondary | ICD-10-CM

## 2022-06-06 LAB — BASIC METABOLIC PANEL
Anion gap: 6 (ref 5–15)
BUN: 51 mg/dL — ABNORMAL HIGH (ref 8–23)
CO2: 25 mmol/L (ref 22–32)
Calcium: 8.3 mg/dL — ABNORMAL LOW (ref 8.9–10.3)
Chloride: 106 mmol/L (ref 98–111)
Creatinine, Ser: 1.85 mg/dL — ABNORMAL HIGH (ref 0.61–1.24)
GFR, Estimated: 36 mL/min — ABNORMAL LOW (ref >60)
Glucose, Bld: 136 mg/dL — ABNORMAL HIGH (ref 70–99)
Potassium: 3.9 mmol/L (ref 3.5–5.1)
Sodium: 137 mmol/L (ref 135–145)

## 2022-06-06 MED ORDER — FUROSEMIDE 20 MG PO TABS
20.0000 mg | ORAL_TABLET | Freq: Every day | ORAL | Status: DC
  Administered 2022-06-06 – 2022-06-07 (×2): 20 mg via ORAL
  Filled 2022-06-06 (×2): qty 1

## 2022-06-06 NOTE — Plan of Care (Signed)
  Problem: Clinical Measurements: Goal: Diagnostic test results will improve Outcome: Progressing Goal: Respiratory complications will improve Outcome: Progressing Goal: Cardiovascular complication will be avoided Outcome: Progressing   Problem: Activity: Goal: Risk for activity intolerance will decrease Outcome: Progressing   Problem: Nutrition: Goal: Adequate nutrition will be maintained Outcome: Progressing

## 2022-06-06 NOTE — Care Management Important Message (Signed)
Important Message  Patient Details IM Letter given to the Patient. [name]. MRN: [id number] Date of Birth: 1941-05-05   Medicare Important Message Given:  Yes     Kerin Salen 06/06/2022, 10:38 AM

## 2022-06-06 NOTE — Progress Notes (Signed)
[facility]                                                                               Paul Sandoval, is a 81 y.o. male, DOB - 1941-05-22, QQI:[id number] Admit date - 06/02/2022    Outpatient Primary MD for the patient is Coral Spikes, DO  LOS - 3  days    Brief summary   Paul Erney. is a 81 y.o. 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    Assessment & Plan    Assessment and Plan:  Acute respiratory failure with hypoxia secondary to a combination of acute copd exacerbation and mild acute on chronic combined CHF.  Covid and influenza PCR is negative.  Admit for IV steroids and IV lasix. On exam today, he looks better, weaned off oxygen. Wheezing has improved. Transitioned to oral prednisone, plan for a quick taper.  Continue bronchodilators Doxycycline for acute bronchitis.    Acute on chronic combined systolic and diastolic CHF.  Diuresis with IV lasix.  BNP elevated at 906, CXR shows unchanged pleural effusions and chronic changes .  US thoracentesis ordered, no fluid on the left and very small fluid pocket on the right, not enough for analysis.  X-ray findings of right loculated pleural effusion present for the last several years BUN trending up IV Lasix changed to p.o Repeat labs in a.m.  Continue with strict intake and output.     Stage 3 B CKD Creatinine appears to be at baseline. Creatinine stable at 1.8. UA is negative. BUN trending up, could be related to diuretics versus steroids. Lasix changed to p.o.    Mild anemia and thrombocytopenia:  Platelet count improved.  Hemoglobin stable at 10.      Mildly elevated troponins from demand ischemia from acute CHF.    Hyperlipidemia:  Resume statin.    Leukocytosis:  Likely related to steroids.  Does not appear septic or toxic   Estimated body mass index is 25.43 kg/m as calculated from the following:   Height as of this encounter: 5\' 10"  (1.778 m).   Weight as of this encounter: 80.4 kg.  Code Status: full code.  DVT Prophylaxis:  SCDs Start: 06/02/22 2309   Level of Care: Level of care: Progressive Family Communication: none at bedside.   Disposition Plan:     Remains inpatient appropriate:  IV lasix, , possible discharge in am.   Procedures:  None.   Consultants:   None.   Antimicrobials:   Anti-infectives (From admission, onward)    Start     Dose/Rate Route Frequency Ordered Stop   06/05/22 1030  doxycycline (VIBRA-TABS) tablet 100 mg        100 mg Oral Every 12 hours 06/05/22 0932     06/02/22 2315  doxycycline (VIBRAMYCIN) 100 mg in sodium chloride 0.9 % 250 mL IVPB  Status:  Discontinued  Note to Pharmacy: (In setting of acute copd exac)   100 mg 125 mL/hr over 120 Minutes Intravenous Every 12 hours 06/02/22 2312 06/05/22 0932        Medications  Scheduled Meds:  carvedilol  18.75 mg Oral BID WC   clopidogrel  75 mg Oral Daily   dapagliflozin propanediol  10 mg Oral QAC breakfast   doxycycline  100 mg Oral Q12H   ipratropium-albuterol  3 mL Nebulization TID   lidocaine       mometasone-formoterol  2 puff Inhalation BID   pantoprazole  40 mg Oral Daily   polyethylene glycol  17 g Oral Daily   predniSONE  60 mg Oral QAC breakfast   rosuvastatin  20 mg Oral Daily   sacubitril-valsartan  1 tablet Oral BID   senna-docusate  2 tablet Oral BID   spironolactone  25 mg Oral Daily   Continuous Infusions:   PRN Meds:.acetaminophen **OR** acetaminophen, albuterol, bisacodyl, fluticasone, lidocaine, nitroGLYCERIN, mouth rinse, sodium chloride    Subjective:   Paul Sandoval was  sitting up in the chair.  Currently on room air.  Reports that he continues to have some cough, but it is better.  Still has lower extremity edema.  Was able to ambulate today.  Objective:   Vitals:   06/05/22 0426 06/05/22 1000 06/05/22 1005 06/05/22 1009  BP: (!) 99/50 (!) 91/49  (!) 91/49  Pulse: 87   83  Resp: 18     Temp:      TempSrc:      SpO2: 92%  94% 97%  Weight:      Height:        Intake/Output Summary (Last 24 hours) at 06/05/2022 1445 Last data filed at 06/05/2022 0900 Gross per 24 hour  Intake 1696.46 ml  Output 1700 ml  Net -3.54 ml    Filed Weights   06/02/22 1531  Weight: 80.4 kg     Exam General exam: Appears calm and comfortable  Respiratory system: Occasional rhonchi, no wheezing, good air movement bilaterally Cardiovascular system: S1 & S2 heard, RRR. No JVD,   Gastrointestinal system: Abdomen is nondistended, soft and nontender. Normal bowel sounds heard. Central nervous system: Alert and oriented. No focal neurological deficits. Extremities: 1+ pedal edema bilaterally Skin: No rashes, lesions or ulcers Psychiatry:  Mood & affect appropriate.      Data Reviewed:  I have personally reviewed following labs and imaging studies   CBC Lab Results  Component Value Date   WBC 16.1 (H) 06/05/2022   RBC 3.73 (L) 06/05/2022   HGB 10.1 (L) 06/05/2022   HCT 32.3 (L) 06/05/2022   MCV 86.6 06/05/2022   MCH 27.1 06/05/2022   PLT 172 06/05/2022   MCHC 31.3 06/05/2022   RDW 16.2 (H) 06/05/2022   LYMPHSABS 0.5 (L) 06/05/2022   MONOABS 0.5 06/05/2022   EOSABS 0.0 06/05/2022   BASOSABS 0.0 27/51/7001     Last metabolic panel Lab Results  Component Value Date   NA 135 06/05/2022   K 3.7 06/05/2022   CL 105 06/05/2022   CO2 22 06/05/2022   BUN 46 (H) 06/05/2022   CREATININE 1.72 (H) 06/05/2022   GLUCOSE 139 (H) 06/05/2022   GFRNONAA 39 (L) 06/05/2022   GFRAA 42 (L) 05/23/2020   CALCIUM 8.3 (L) 06/05/2022   PHOS 3.7 06/03/2022   PROT 6.8  06/03/2022   ALBUMIN 3.0 (L) 06/03/2022   LABGLOB 3.6 12/14/2020   AGRATIO 0.8 12/14/2020   BILITOT 0.7 06/03/2022  ALKPHOS 58 06/03/2022   AST 12 (L) 06/03/2022   ALT 11 06/03/2022   ANIONGAP 8 06/05/2022    CBG (last 3)  No results for input(s): "GLUCAP" in the last 72 hours.    Coagulation Profile: Recent Labs  Lab 06/03/22 0521  INR 1.3*      Radiology Studies: No results found.     Kathie Dike M.D. [facility] 06/05/2022, 2:45 PM  Available via Epic secure chat 7am-7pm After 7 pm, please refer to night coverage provider listed on amion.

## 2022-06-06 NOTE — Progress Notes (Signed)
Mobility Specialist - Progress Note   Pre-mobility: 95% SpO2 During mobility: 88% SpO2 Post-mobility: 92% SPO2   06/06/22 1400  Mobility  Activity Ambulated independently in hallway  Level of Assistance Independent  Assistive Device None  Distance Ambulated (ft) 350 ft  Range of Motion/Exercises Active  Activity Response Tolerated well  $Mobility charge 1 Mobility   Pt was found on recliner chair and agreeable to ambulate. Had no complaints and at EOS returned to bed with all necessities in reach.  Ferd Hibbs Mobility Specialist

## 2022-06-07 DIAGNOSIS — I5043 Acute on chronic combined systolic (congestive) and diastolic (congestive) heart failure: Secondary | ICD-10-CM

## 2022-06-07 LAB — BASIC METABOLIC PANEL
Anion gap: 5 (ref 5–15)
BUN: 44 mg/dL — ABNORMAL HIGH (ref 8–23)
CO2: 26 mmol/L (ref 22–32)
Calcium: 8.2 mg/dL — ABNORMAL LOW (ref 8.9–10.3)
Chloride: 107 mmol/L (ref 98–111)
Creatinine, Ser: 1.73 mg/dL — ABNORMAL HIGH (ref 0.61–1.24)
GFR, Estimated: 39 mL/min — ABNORMAL LOW (ref >60)
Glucose, Bld: 125 mg/dL — ABNORMAL HIGH (ref 70–99)
Potassium: 3.9 mmol/L (ref 3.5–5.1)
Sodium: 138 mmol/L (ref 135–145)

## 2022-06-07 MED ORDER — DOXYCYCLINE HYCLATE 100 MG PO TABS: 100.0000 mg | ORAL_TABLET | Freq: Two times a day (BID) | ORAL | 0 refills | Status: AC

## 2022-06-07 NOTE — Progress Notes (Signed)
Pt discharged to home at this time. Prior to DC, IV and tele was removed from pt. Pt was given DC paperwork regarding medications, condition, and follow up appointment. Pt verbalized understanding and stated no other concerns at this time. Pt left in personal vehicle driven by wife, and was stable at time of dc.

## 2022-06-07 NOTE — Progress Notes (Signed)
  Daily Progress Note   Patient [name].       Date: 06/07/2022 DOB: 04/23/1941  Age: 81 y.o. MRN#: [id number] Attending Physician: Kathie Dike, MD Primary Care Physician: Coral Spikes, DO Admit Date: 06/02/2022 Length of Stay: 5 days  Palliative team had met with patient on 10/10. Patient reluctant to discuss specifics regarding his advance care planning documents though noted he had them at home and his wife and son know "what he wants".  Encouraged continued conversations with family and sharing of documents with providers if felt comfortable doing so.  Also noted patient is a veteran and stated he is well connected to services if needed moving forward.  Encourage continued conversations with providers at [facility] as well to use benefits as supports patient's lifestyle moving forward.  Patient given palliative care contact information and noted he would reach out if he felt he needed [place] in the future.  Thank you for allowing [place] to be involved in the care on [name].  Please reach out if we can be of further assistance in the future.  Chelsea Aus, DO Palliative Care Provider PMT # [phone]

## 2022-06-07 NOTE — Progress Notes (Signed)
Mobility Specialist Cancellation/Refusal Note:    06/07/22 1035  Mobility  Activity Refused mobility    Reason for Cancellation/Refusal: Pt declined mobility at this time. Pt wants to wait until see by respiratory therapist. Will check back as schedule permits.     [facility]

## 2022-06-07 NOTE — Discharge Summary (Signed)
Physician Discharge Summary  Paul Sandoval. JQB:[id number] DOB: June 15, 1941 DOA: 06/02/2022  PCP: Coral Spikes, DO  Admit date: 06/02/2022 Discharge date: 06/07/2022  Admitted From: home Disposition:  home  Recommendations for Outpatient Follow-up:  Follow up with PCP in 1-2 weeks Please obtain BMP/CBC in one week Follow-up with cardiologist, Dr. Aundra Dubin and pulmonologist, Dr. Elsworth Soho  Home Health: Equipment/Devices:  Discharge Condition: Stable CODE STATUS: Full code Diet recommendation: Heart healthy, carb modified  Brief/Interim Summary: Paul Gloria. is a 81 y.o. 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   Discharge Diagnoses:  Principal Problem:   Acute exacerbation of chronic obstructive pulmonary disease (COPD) ([place]) Active Problems:   HLD (hyperlipidemia)   Stage 3b chronic kidney disease (CKD) (HCC)   Chronic combined systolic (congestive) and diastolic (congestive) heart failure (HCC)   Acute respiratory failure with hypoxia (HCC)   SIRS (systemic inflammatory response syndrome) (HCC)   Anemia of chronic kidney failure  Acute respiratory failure with hypoxia secondary to a combination of acute copd exacerbation and mild acute on chronic combined CHF.  Covid and influenza PCR is negative.  Admit for IV steroids and IV lasix. On exam today, he looks better, weaned off oxygen. Wheezing has improved. Transitioned to oral prednisone, plan for a quick taper.  Continue bronchodilators Doxycycline for acute bronchitis.      Acute on chronic combined systolic and diastolic CHF.  Diuresis with IV lasix.  BNP elevated at 906,  CXR shows unchanged pleural effusions and chronic changes .  US thoracentesis ordered, no fluid on the left and very small fluid pocket on the right, not enough for analysis.  X-ray findings of right loculated pleural effusion present for the last several years He was diuresed with IV Lasix and now appears to be approaching euvolemia He is now back on his home dose of Lasix 20 mg p.o. daily Continued on Entresto, SGLT2, carvedilol, eplerenone Continue with strict intake and output.        Stage 3 B CKD Creatinine appears to be at baseline. Creatinine stable at 1.8. UA is negative. Tolerating diuretics Lasix changed to p.o.       Mild anemia and thrombocytopenia:  Platelet count improved.  Hemoglobin stable at 10.        Mildly elevated troponins from demand ischemia from acute CHF.      Hyperlipidemia:  Resume statin.      Leukocytosis:  Likely related to steroids.  Does not appear septic or toxic  Discharge Instructions  Discharge Instructions     Diet - low sodium heart healthy   Complete by: As directed    Increase activity slowly   Complete by: As directed       Allergies as of 06/07/2022       Reactions   Neomycin Hives   Anoro Ellipta [umeclidinium-vilanterol] Itching   Rash over face, neck   Stiolto Respimat [tiotropium Bromide-olodaterol] Itching   Rash over face , neck   Cetirizine & Related Rash        Medication List     TAKE these medications    acetaminophen 500 MG tablet Commonly known as: TYLENOL Take 1,000 mg by mouth every 6 (six) hours as needed  for fever.   albuterol 108 (90 Base) MCG/ACT inhaler Commonly known as: VENTOLIN HFA Inhale 2 puffs into the lungs every 6 (six) hours as needed for wheezing or shortness of breath.   carvedilol 12.5 MG tablet Commonly known as: COREG TAKE 1 &1/2 TABLET BY MOUTH TWICE DAILY WITH A MEAL What changed: See the new instructions.   clopidogrel 75 MG tablet Commonly known as: PLAVIX TAKE  (1) TABLET BY MOUTH ONCE DAILY. What changed: See the new instructions.   dapagliflozin propanediol 10 MG Tabs tablet Commonly known as: FARXIGA Take 10 mg by mouth daily before breakfast.   doxycycline 100 MG tablet Commonly known as: VIBRA-TABS Take 1 tablet (100 mg total) by mouth every 12 (twelve) hours for 3 days.   eplerenone 50 MG tablet Commonly known as: INSPRA Take 1 tablet (50 mg total) by mouth every evening.   fluticasone 50 MCG/ACT nasal spray Commonly known as: FLONASE Place 1 spray into both nostrils daily as needed for allergies or rhinitis.   fluticasone-salmeterol 100-50 MCG/ACT Aepb Commonly known as: Wixela Inhub Inhale 1 puff into the lungs 2 (two) times daily.   furosemide 20 MG tablet Commonly known as: LASIX Take 20 mg by mouth daily.   ipratropium-albuterol 0.5-2.5 (3) MG/3ML Soln Commonly known as: DUONEB Take 3 mLs by nebulization in the morning, at noon, and at bedtime.   nitroGLYCERIN 0.4 MG SL tablet Commonly known as: Nitrostat PLACE 1 TAB UNDER TONGUE EVERY 5 MIN IF NEEDED FOR CHEST PAIN. MAY USE 3 TIMES.NO RELIEF CALL 911.   nystatin 100000 UNIT/ML suspension Commonly known as: MYCOSTATIN Take 5 mLs (500,000 Units total) by mouth 4 (four) times daily.   omeprazole 20 MG capsule Commonly known as: PRILOSEC Take 1 capsule (20 mg total) by mouth daily.   predniSONE 10 MG tablet Commonly known as: DELTASONE Take 4 tablets (40 mg total) by mouth daily with breakfast for 4 days, THEN 3 tablets (30 mg total) daily with breakfast for 4 days, THEN 2 tablets (20 mg total) daily with breakfast for 4 days, THEN 1 tablet (10 mg total) daily with breakfast for 4 days. Start taking on: June 01, 2022   ROBITUSSIN DM PO Take 20 mLs by mouth daily as needed (for cough).   rosuvastatin 20 MG tablet Commonly known as: CRESTOR TAKE ONE TABLET BY MOUTH ONCE DAILY.   sacubitril-valsartan 97-103 MG Commonly known as: ENTRESTO Take 0.5 tablets by  mouth 2 (two) times daily.   sodium chloride 0.65 % Soln nasal spray Commonly known as: OCEAN Place 1 spray into both nostrils as needed for congestion.        Allergies  Allergen Reactions   Neomycin Hives   Anoro Ellipta [Umeclidinium-Vilanterol] Itching    Rash over face, neck   Stiolto Respimat [Tiotropium Bromide-Olodaterol] Itching    Rash over face , neck   Cetirizine & Related Rash    Consultations: Palliative care   Procedures/Studies: [place] CHEST (PLEURAL EFFUSION)  Result Date: 06/06/2022 CLINICAL DATA:  Possible right pleural effusion by chest x-ray. EXAM: CHEST ULTRASOUND COMPARISON:  Chest x-ray on 06/02/2022 FINDINGS: Examination of the right pleural space by ultrasound demonstrates a small volume complex appearing pleural fluid. There was not enough fluid to perform thoracentesis. Underlying soft tissue may be related to atelectatic lung. Malignancy cannot be excluded by ultrasound. IMPRESSION: Small volume of complex appearing right pleural fluid. Underlying malignancy is not excluded by ultrasound. Electronically Signed   By: Aletta Edouard M.D.   On: 06/06/2022  07:51   DG Chest 2 View  Result Date: 06/02/2022 CLINICAL DATA:  Shortness of breath, history of congestive heart failure, bronchitis, former smoker EXAM: CHEST - 2 VIEW COMPARISON:  Multiple chest x-rays, most recently May 31, 2022, chest CT March 12, 2022 FINDINGS: Stable cardiomediastinal contours status post median sternotomy and CABG. Similar appearance of the heterogeneous opacities in the right apex and right lower lobe, consistent with known fibrosis. Similar appearance of chronic large right pleural effusion. No new focal pulmonary opacity. No large pneumothorax. No acute osseous abnormality. Partially visualized vascular stent in the upper mid abdomen. IMPRESSION: Overall similar appearance of the chronic opacities right lung and pleural effusion. No acute cardiopulmonary abnormality Electronically  Signed   By: Beryle Flock M.D.   On: 06/02/2022 16:05   DG Chest 2 View  Result Date: 06/02/2022 CLINICAL DATA:  COPD. EXAM: CHEST - 2 VIEW COMPARISON:  02/18/2022.  CT, 03/12/2022. FINDINGS: Stable changes from prior CABG surgery. Cardiac silhouette normal in size. No mediastinal or hilar masses. Stable opacity at the right apex consistent with scarring. Opacity at the right lung base is consistent with a chronic effusion and atelectasis or scarring. Bilateral thickened interstitial markings. No convincing pneumonia. No pulmonary edema. No left pleural effusion and no pneumothorax. Skeletal structures are grossly intact. IMPRESSION: 1. No acute cardiopulmonary disease. 2. Chronic right lung changes stable from the previous exam is s. Electronically Signed   By: Lajean Manes M.D.   On: 06/02/2022 14:01      Subjective: He is feeling better.  Shortness of breath improving.  Cough is less productive.  He is able to ambulate on room air without any hypoxia.  Discharge Exam: Vitals:   06/06/22 2137 06/07/22 0500 06/07/22 0557 06/07/22 1023  BP: (!) 114/94  (!) 123/59 (!) 112/55  Pulse: 72  80 81  Resp: 19  16 18   Temp: 97.6 F (36.4 C)  97.7 F (36.5 C)   TempSrc: Oral  Oral   SpO2: 97%  96% 96%  Weight:  79.9 kg    Height:        General: Pt is alert, awake, not in acute distress Cardiovascular: RRR, S1/S2 +, no rubs, no gallops Respiratory: CTA bilaterally, no wheezing, no rhonchi Abdominal: Soft, NT, ND, bowel sounds + Extremities: Positive edema, no cyanosis    The results of significant diagnostics from this hospitalization (including imaging, microbiology, ancillary and laboratory) are listed below for reference.     Microbiology: Recent Results (from the past 240 hour(s))  Resp Panel by RT-PCR (Flu A&B, Covid) Anterior Nasal Swab     Status: None   Collection Time: 06/02/22  8:23 PM   Specimen: Anterior Nasal Swab  Result Value Ref Range Status   SARS Coronavirus 2  by RT PCR NEGATIVE NEGATIVE Final    Comment: (NOTE) SARS-CoV-2 target nucleic acids are NOT DETECTED.  The SARS-CoV-2 RNA is generally detectable in upper respiratory specimens during the acute phase of infection. The lowest concentration of SARS-CoV-2 viral copies this assay can detect is 138 copies/mL. A negative result does not preclude SARS-Cov-2 infection and should not be used as the sole basis for treatment or other patient management decisions. A negative result may occur with  improper specimen collection/handling, submission of specimen other than nasopharyngeal swab, presence of viral mutation(s) within the areas targeted by this assay, and inadequate number of viral copies(<138 copies/mL). A negative result must be combined with clinical observations, patient history, and epidemiological information. The expected result  is Negative.  Fact Sheet for Patients:  EntrepreneurPulse.com.au  Fact Sheet for Healthcare Providers:  IncredibleEmployment.be  This test is no t yet approved or cleared by the [place] FDA and  has been authorized for detection and/or diagnosis of SARS-CoV-2 by FDA under an Emergency Use Authorization (EUA). This EUA will remain  in effect (meaning this test can be used) for the duration of the COVID-19 declaration under Section 564(b)(1) of the Act, 21 U.S.C.section 360bbb-3(b)(1), unless the authorization is terminated  or revoked sooner.       Influenza A by PCR NEGATIVE NEGATIVE Final   Influenza B by PCR NEGATIVE NEGATIVE Final    Comment: (NOTE) The Xpert Xpress SARS-CoV-2/FLU/RSV plus assay is intended as an aid in the diagnosis of influenza from Nasopharyngeal swab specimens and should not be used as a sole basis for treatment. Nasal washings and aspirates are unacceptable for Xpert Xpress SARS-CoV-2/FLU/RSV testing.  Fact Sheet for Patients: EntrepreneurPulse.com.au  Fact  Sheet for Healthcare Providers: IncredibleEmployment.be  This test is not yet approved or cleared by the [place] FDA and has been authorized for detection and/or diagnosis of SARS-CoV-2 by FDA under an Emergency Use Authorization (EUA). This EUA will remain in effect (meaning this test can be used) for the duration of the COVID-19 declaration under Section 564(b)(1) of the Act, 21 U.S.C. section 360bbb-3(b)(1), unless the authorization is terminated or revoked.  Performed at [facility], [place] [address] [place], [place] [address]      Labs: BNP (last 3 results) Recent Labs    12/10/21 0839 02/18/22 1452 06/03/22 0020  BNP 522.5* 697.7* 299.3*   Basic Metabolic Panel: Recent Labs  Lab 06/02/22 2020 06/03/22 0521 06/04/22 0935 06/05/22 0355 06/06/22 0339 06/07/22 0405  NA  --  135 134* 135 137 138  K  --  3.9 3.8 3.7 3.9 3.9  CL  --  103 101 105 106 107  CO2  --  24 24 22 25 26   GLUCOSE  --  156* 208* 139* 136* 125*  BUN  --  26* 41* 46* 51* 44*  CREATININE  --  1.71* 1.86* 1.72* 1.85* 1.73*  CALCIUM  --  8.5* 8.6* 8.3* 8.3* 8.2*  MG 2.2 2.3  --   --   --   --   PHOS  --  3.7  --   --   --   --    Liver Function Tests: Recent Labs  Lab 06/03/22 0521  AST 12*  ALT 11  ALKPHOS 58  BILITOT 0.7  PROT 6.8  ALBUMIN 3.0*   No results for input(s): "LIPASE", "AMYLASE" in the last 168 hours. No results for input(s): "AMMONIA" in the last 168 hours. CBC: Recent Labs  Lab 06/02/22 1913 06/03/22 0521 06/05/22 0355  WBC 18.5* 16.7* 16.1*  NEUTROABS 16.8* 15.8* 14.9*  HGB 10.8* 10.6* 10.1*  HCT 34.6* 33.2* 32.3*  MCV 87.2 86.7 86.6  PLT 147* 129* 172   Cardiac Enzymes: No results for input(s): "CKTOTAL", "CKMB", "CKMBINDEX", "TROPONINI" in the last 168 hours. BNP: Invalid input(s): "POCBNP" CBG: No results for input(s): "GLUCAP" in the last 168 hours. D-Dimer No results for input(s): "DDIMER" in the last 72  hours. Hgb A1c No results for input(s): "HGBA1C" in the last 72 hours. Lipid Profile No results for input(s): "CHOL", "HDL", "LDLCALC", "TRIG", "CHOLHDL", "LDLDIRECT" in the last 72 hours. Thyroid function studies No results for input(s): "TSH", "T4TOTAL", "T3FREE", "THYROIDAB" in the last 72 hours.  Invalid input(s): "  FREET3" Anemia work up No results for input(s): "VITAMINB12", "FOLATE", "FERRITIN", "TIBC", "IRON", "RETICCTPCT" in the last 72 hours. Urinalysis    Component Value Date/Time   COLORURINE YELLOW 06/03/2022 0307   APPEARANCEUR CLEAR 06/03/2022 0307   LABSPEC 1.013 06/03/2022 0307   LABSPEC 1.010 03/28/2016 1244   PHURINE 5.0 06/03/2022 0307   GLUCOSEU >=500 (A) 06/03/2022 0307   GLUCOSEU Negative 03/28/2016 [place] NEGATIVE 06/03/2022 0307   BILIRUBINUR NEGATIVE 06/03/2022 0307   BILIRUBINUR Negative 03/28/2016 [place] 06/03/2022 0307   PROTEINUR 30 (A) 06/03/2022 0307   UROBILINOGEN 0.2 03/28/2016 1244   NITRITE NEGATIVE 06/03/2022 0307   LEUKOCYTESUR NEGATIVE 06/03/2022 0307   LEUKOCYTESUR Trace 03/28/2016 1244   Sepsis Labs Recent Labs  Lab 06/02/22 1913 06/03/22 0521 06/05/22 0355  WBC 18.5* 16.7* 16.1*   Microbiology Recent Results (from the past 240 hour(s))  Resp Panel by RT-PCR (Flu A&B, Covid) Anterior Nasal Swab     Status: None   Collection Time: 06/02/22  8:23 PM   Specimen: Anterior Nasal Swab  Result Value Ref Range Status   SARS Coronavirus 2 by RT PCR NEGATIVE NEGATIVE Final    Comment: (NOTE) SARS-CoV-2 target nucleic acids are NOT DETECTED.  The SARS-CoV-2 RNA is generally detectable in upper respiratory specimens during the acute phase of infection. The lowest concentration of SARS-CoV-2 viral copies this assay can detect is 138 copies/mL. A negative result does not preclude SARS-Cov-2 infection and should not be used as the sole basis for treatment or other patient management decisions. A negative result  may occur with  improper specimen collection/handling, submission of specimen other than nasopharyngeal swab, presence of viral mutation(s) within the areas targeted by this assay, and inadequate number of viral copies(<138 copies/mL). A negative result must be combined with clinical observations, patient history, and epidemiological information. The expected result is Negative.  Fact Sheet for Patients:  EntrepreneurPulse.com.au  Fact Sheet for Healthcare Providers:  IncredibleEmployment.be  This test is no t yet approved or cleared by the [place] FDA and  has been authorized for detection and/or diagnosis of SARS-CoV-2 by FDA under an Emergency Use Authorization (EUA). This EUA will remain  in effect (meaning this test can be used) for the duration of the COVID-19 declaration under Section 564(b)(1) of the Act, 21 U.S.C.section 360bbb-3(b)(1), unless the authorization is terminated  or revoked sooner.       Influenza A by PCR NEGATIVE NEGATIVE Final   Influenza B by PCR NEGATIVE NEGATIVE Final    Comment: (NOTE) The Xpert Xpress SARS-CoV-2/FLU/RSV plus assay is intended as an aid in the diagnosis of influenza from Nasopharyngeal swab specimens and should not be used as a sole basis for treatment. Nasal washings and aspirates are unacceptable for Xpert Xpress SARS-CoV-2/FLU/RSV testing.  Fact Sheet for Patients: EntrepreneurPulse.com.au  Fact Sheet for Healthcare Providers: IncredibleEmployment.be  This test is not yet approved or cleared by the [place] FDA and has been authorized for detection and/or diagnosis of SARS-CoV-2 by FDA under an Emergency Use Authorization (EUA). This EUA will remain in effect (meaning this test can be used) for the duration of the COVID-19 declaration under Section 564(b)(1) of the Act, 21 U.S.C. section 360bbb-3(b)(1), unless the authorization is terminated  or revoked.  Performed at [facility], [place] [address] [place], [place] [address]      Time coordinating discharge: 37mins  SIGNED:   Kathie Dike, MD  [facility] 06/07/2022, 12:09 PM   If  7PM-7AM, please contact night-coverage [email or web address]

## 2022-06-08 ENCOUNTER — Telehealth: Payer: Self-pay | Admitting: Pulmonary Disease

## 2022-06-08 NOTE — Telephone Encounter (Signed)
Called and spoke with pt letting him know recs per Dr. Lamonte Sakai and he verbalized understanding. Pt is currently scheduled for a f/u with Dr. Elsworth Soho 12/12 but states that he needs to get in for a sooner hospital follow up appt. Pt does not want to see an APP and only wants to see Dr. Elsworth Soho. Dr. Elsworth Soho, you do not have any openings either at [place] office or [place] office for a 37min slot. Would you be okay if we scheduled pt in a 58min slot?

## 2022-06-08 NOTE — Telephone Encounter (Signed)
Patient was issued prednisone 10/6 by Dr. Elsworth Soho and only took 1 dose on 10/8 before being admitted to the hospital, released yesterday. ER doc wanted RA to advise if patient should cotinue the remaining 15 dosages of prednisone starting today. Patient would like to know asap so he can continue medication. Please advise.

## 2022-06-08 NOTE — Telephone Encounter (Signed)
Agree - would get back on the pred as directed by Dr Elsworth Soho

## 2022-06-08 NOTE — Telephone Encounter (Signed)
Called and spoke with patient. He stated that he only took 1 dose of the prednisone before going to the hospital. He was given IV steroids in the hospital. Per patient, the hospitalist wanted him to contact RA to see if he needed to resume his prednisone. I advised him that per the hospital note, they wanted him to resume the prednisone RA sent in. He wanted to hear this from Dr. Elsworth Soho. I made him aware that Dr. Elsworth Soho is not available today.   RB, can you please advise on RA's behalf. Thanks!

## 2022-06-11 NOTE — Telephone Encounter (Signed)
Double booked patient for this Wednesday at 11:00. Will verify with Dr. Elsworth Soho and call patient back if Dr. Elsworth Soho wants [place] to move appt.

## 2022-06-12 ENCOUNTER — Telehealth: Payer: Self-pay | Admitting: *Deleted

## 2022-06-12 ENCOUNTER — Ambulatory Visit: Payer: Medicare Other | Admitting: Pulmonary Disease

## 2022-06-12 ENCOUNTER — Encounter: Payer: Self-pay | Admitting: Pulmonary Disease

## 2022-06-12 DIAGNOSIS — I5042 Chronic combined systolic (congestive) and diastolic (congestive) heart failure: Secondary | ICD-10-CM

## 2022-06-12 DIAGNOSIS — C3411 Malignant neoplasm of upper lobe, right bronchus or lung: Secondary | ICD-10-CM | POA: Diagnosis not present

## 2022-06-12 DIAGNOSIS — J4489 Other specified chronic obstructive pulmonary disease: Secondary | ICD-10-CM

## 2022-06-12 DIAGNOSIS — J9611 Chronic respiratory failure with hypoxia: Secondary | ICD-10-CM | POA: Diagnosis not present

## 2022-06-12 NOTE — Patient Instructions (Signed)
  X CT chest wo con now - change from Nov   X Qualify for [facility]  Complete course of prednisone Stay on lasix  - get earlier appt with CHF clinic - dr Aundra Dubin

## 2022-06-12 NOTE — Telephone Encounter (Signed)
Paul Sandoval with Apothecary called to confirm that she did receive the orders and is processing it now.  No further action is needed.  CB# [phone]

## 2022-06-12 NOTE — Telephone Encounter (Signed)
Called and spoke with patient. He stated that his O2 levels have dropped into the 80s since his appt this morning. He has not heard anything from [place] in regards to his POC. I confirmed that the order was placed.   I called [facility] to check on the status of his oxygen but Mariann Laster was not available. I left a detailed message for her to give [place] a call back asap.   Will leave this encounter open for follow up.

## 2022-06-12 NOTE — Progress Notes (Signed)
Subjective:    Patient ID: Paul Ewing., male    DOB: 04-06-1941, 81 y.o.   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  S/p VATS & talc pleurodesis on RT, chronic RT loculated effusion Sputum cultures 07/2020 - Serratia , 09/2021 -resistant Pseudomonas 03/2022 Pseudomonas sensitive to Cipro   PMH - CKD -3, ischemic cardiomyopathy - EF 30%  s/p CABG in 1996 and PCI to dLM and SVG-D in 02/2014   Meds - skin rash with Anoro, even when he was on other medications such as Wixela and Symbicort, thrush with wixela    OV 11/2021 >> dc anoro & stiolto,  changed to wixela, gets thrush  ED visit on 6/25 >> doxy + pred 7/11 >> Rx with levaquin x 7ds    Last OV 7/25 >> flutter valve & robitussin  X Rx for hypertonic saline    Chief Complaint  Patient presents with   [place] admission for COPD from 10/7-10/12  States starting yesterday he started coughing up yellow and green sputum again.  Was 88% on room air after walking from lobby to room. Increased to 90% on room air after a few minutes of rest    Last OV 10/5 >> CP , sob >> pred course Unfortunately he continued to feel bad after his last visit and was hospitalized 10/7 to 10/12, he reports a low-grade temperature.  COVID and flu testing was negative, BNP 906.  Chest x-ray did not show any new infiltrates.  Ultrasound chest showed chronic loculated right pleural fluid not enough to tap. He was treated with IV Lasix, IV steroids and doxycycline.  He says that he urinated a lot, his weight today is 180 pounds of course with his jacket and clothes on.  His feet swelling is decreased. However he remains short of breath on exertion and in fact on walking today his saturation dropped to 88%.  Oxygen saturation subsequently checked was 87% at rest and improved with oxygen. He is compliant with 20 mg of  Lasix daily Has occasional sputum with minimal white expectoration  Significant tests/ events reviewed  02/2022 CT chest without con >> chronic changes right lung , no new infiltrates or lymphadenopathy, chronic loculated RT pleural fluid   08/2021 CT chest wo con >> new area of groundglass left upper lobe   02/2021 CT chest postirradiation appearance with dense fibrotic consolidation and volume loss right upper lobe, loculated right small effusion with pleural  calcification Enlarged subcarinal lymph node stable for more than 2 years     04/09/2018-echocardiogram-LV ejection fraction 25 to 30%, diffuse hypokinesis,   02/01/2016-pulmonary function test- FVC 1.96 (45% predicted), postbronchodilator ratio of 67, postbronchodilator FEV1 1.64 (53% predicted, postbronchodilator response, significant mid flow reversibility, DLCO 58  Review of Systems neg for any significant sore throat, dysphagia, itching, sneezing, nasal congestion or excess/ purulent secretions, fever, chills, sweats, unintended wt loss, pleuritic or exertional cp, hempoptysis, orthopnea pnd or change in chronic leg swelling. Also denies presyncope, palpitations, heartburn, abdominal pain, nausea, vomiting, diarrhea or change in bowel or urinary habits, dysuria,hematuria, rash, arthralgias, visual complaints, headache, numbness weakness or ataxia.     Objective:   Physical Exam  Gen. Pleasant, well-nourished, in no distress ENT - no thrush, no pallor/icterus,no post nasal drip Neck: No JVD, no  thyromegaly, no carotid bruits Lungs: no use of accessory muscles, no dullness to percussion, decreased right base without rales or rhonchi  Cardiovascular: Rhythm regular, heart sounds  normal, no murmurs or gallops, no peripheral edema Musculoskeletal: No deformities, no cyanosis or clubbing          Assessment & Plan:

## 2022-06-12 NOTE — Addendum Note (Signed)
Addended by: Fritzi Mandes D on: 06/12/2022 09:59 AM   Modules accepted: Orders

## 2022-06-12 NOTE — Assessment & Plan Note (Signed)
His oxygen saturation was 87% at rest today and improved with oxygen.  He qualifies for oxygen. We will try to get him portable concentrator, prescription sent to [facility]

## 2022-06-12 NOTE — Assessment & Plan Note (Signed)
His weight is back to baseline. I will ask him to obtain a follow-up in the CHF clinic. He continues on Lasix 20 mg daily BNP was high, 906 during current admission

## 2022-06-12 NOTE — Assessment & Plan Note (Signed)
No evidence of malignancy on recent CTs. We will obtain a 44-month follow-up CT chest without contrast since I cannot figure out any reason why his breathing is worse and he is hypoxic

## 2022-06-12 NOTE — Telephone Encounter (Signed)
Patient states he was just seen this morning. His current o2 levels are in the mid 80s  Please call patient to advise.

## 2022-06-12 NOTE — Assessment & Plan Note (Signed)
Am not sure what is making him worse.  Not typical for a COPD exacerbation but I have asked him to complete course of prednisone he was was treated with doxycycline, previous sputum cultures have shown sensitive Pseudomonas for which he has been treated adequately. He was on [facility] during the hospital stay, and is now back on [place]. He has DuoNebs to use as needed.

## 2022-06-13 ENCOUNTER — Ambulatory Visit: Payer: Medicare Other | Admitting: Pulmonary Disease

## 2022-06-14 ENCOUNTER — Other Ambulatory Visit (HOSPITAL_COMMUNITY): Payer: Medicare Other

## 2022-06-14 ENCOUNTER — Telehealth: Payer: Self-pay | Admitting: Pulmonary Disease

## 2022-06-14 ENCOUNTER — Ambulatory Visit: Payer: Medicare Other

## 2022-06-14 MED ORDER — LEVOFLOXACIN 500 MG PO TABS: 500.0000 mg | ORAL_TABLET | Freq: Every day | ORAL | 0 refills | Status: AC

## 2022-06-14 NOTE — Telephone Encounter (Signed)
Called and spoke to patient he states that  he is coughing up a lot of sputum again that is thick green and yellow.  He has also had decreased urine output (from 40-70 cc's) and drinks an average of 62 ounces a day. He states the urine is yellow in color and denies pain or burning when urinating. He is only drinking water. Patient also states that he has had an intermittent fever and the last time he check was 101.3. He has been taking tylenol and the fever goes down and then comes back up.   Dr. Elsworth Soho please advise.

## 2022-06-14 NOTE — Telephone Encounter (Signed)
Called and spoke to patient and went over recs with him. He voiced understanding. Levaquin order placed. Nothing further needed at this time,

## 2022-06-18 ENCOUNTER — Ambulatory Visit (HOSPITAL_COMMUNITY): Admission: RE | Admit: 2022-06-18 | Payer: Medicare Other | Source: Ambulatory Visit

## 2022-06-18 ENCOUNTER — Other Ambulatory Visit: Payer: Self-pay

## 2022-06-18 ENCOUNTER — Inpatient Hospital Stay (HOSPITAL_COMMUNITY)
Admission: EM | Admit: 2022-06-18 | Discharge: 2022-07-27 | DRG: 166 | Disposition: E | Payer: Medicare Other | Attending: Internal Medicine | Admitting: Internal Medicine

## 2022-06-18 ENCOUNTER — Emergency Department (HOSPITAL_COMMUNITY): Payer: Medicare Other

## 2022-06-18 ENCOUNTER — Encounter (HOSPITAL_COMMUNITY): Payer: Self-pay | Admitting: Emergency Medicine

## 2022-06-18 DIAGNOSIS — I509 Heart failure, unspecified: Secondary | ICD-10-CM

## 2022-06-18 DIAGNOSIS — Z781 Physical restraint status: Secondary | ICD-10-CM

## 2022-06-18 DIAGNOSIS — E785 Hyperlipidemia, unspecified: Secondary | ICD-10-CM | POA: Diagnosis present

## 2022-06-18 DIAGNOSIS — Z8249 Family history of ischemic heart disease and other diseases of the circulatory system: Secondary | ICD-10-CM

## 2022-06-18 DIAGNOSIS — R0602 Shortness of breath: Principal | ICD-10-CM

## 2022-06-18 DIAGNOSIS — Z9689 Presence of other specified functional implants: Secondary | ICD-10-CM | POA: Diagnosis present

## 2022-06-18 DIAGNOSIS — F05 Delirium due to known physiological condition: Secondary | ICD-10-CM | POA: Diagnosis not present

## 2022-06-18 DIAGNOSIS — I252 Old myocardial infarction: Secondary | ICD-10-CM

## 2022-06-18 DIAGNOSIS — R338 Other retention of urine: Secondary | ICD-10-CM | POA: Diagnosis not present

## 2022-06-18 DIAGNOSIS — E876 Hypokalemia: Secondary | ICD-10-CM | POA: Diagnosis not present

## 2022-06-18 DIAGNOSIS — Z881 Allergy status to other antibiotic agents status: Secondary | ICD-10-CM

## 2022-06-18 DIAGNOSIS — E871 Hypo-osmolality and hyponatremia: Secondary | ICD-10-CM | POA: Diagnosis present

## 2022-06-18 DIAGNOSIS — I358 Other nonrheumatic aortic valve disorders: Secondary | ICD-10-CM | POA: Diagnosis present

## 2022-06-18 DIAGNOSIS — I5043 Acute on chronic combined systolic (congestive) and diastolic (congestive) heart failure: Secondary | ICD-10-CM | POA: Diagnosis not present

## 2022-06-18 DIAGNOSIS — I952 Hypotension due to drugs: Secondary | ICD-10-CM | POA: Diagnosis not present

## 2022-06-18 DIAGNOSIS — R7303 Prediabetes: Secondary | ICD-10-CM | POA: Diagnosis present

## 2022-06-18 DIAGNOSIS — Z79899 Other long term (current) drug therapy: Secondary | ICD-10-CM

## 2022-06-18 DIAGNOSIS — Z95828 Presence of other vascular implants and grafts: Secondary | ICD-10-CM

## 2022-06-18 DIAGNOSIS — J441 Chronic obstructive pulmonary disease with (acute) exacerbation: Secondary | ICD-10-CM | POA: Diagnosis present

## 2022-06-18 DIAGNOSIS — J9622 Acute and chronic respiratory failure with hypercapnia: Secondary | ICD-10-CM | POA: Diagnosis not present

## 2022-06-18 DIAGNOSIS — R739 Hyperglycemia, unspecified: Secondary | ICD-10-CM | POA: Diagnosis not present

## 2022-06-18 DIAGNOSIS — J690 Pneumonitis due to inhalation of food and vomit: Secondary | ICD-10-CM | POA: Diagnosis not present

## 2022-06-18 DIAGNOSIS — T17590A Other foreign object in bronchus causing asphyxiation, initial encounter: Secondary | ICD-10-CM | POA: Diagnosis present

## 2022-06-18 DIAGNOSIS — J90 Pleural effusion, not elsewhere classified: Secondary | ICD-10-CM | POA: Diagnosis present

## 2022-06-18 DIAGNOSIS — I10 Essential (primary) hypertension: Secondary | ICD-10-CM | POA: Diagnosis present

## 2022-06-18 DIAGNOSIS — M1990 Unspecified osteoarthritis, unspecified site: Secondary | ICD-10-CM | POA: Diagnosis present

## 2022-06-18 DIAGNOSIS — Z96642 Presence of left artificial hip joint: Secondary | ICD-10-CM | POA: Diagnosis present

## 2022-06-18 DIAGNOSIS — Z7189 Other specified counseling: Secondary | ICD-10-CM

## 2022-06-18 DIAGNOSIS — J941 Fibrothorax: Secondary | ICD-10-CM | POA: Diagnosis present

## 2022-06-18 DIAGNOSIS — Z8679 Personal history of other diseases of the circulatory system: Secondary | ICD-10-CM

## 2022-06-18 DIAGNOSIS — Z9221 Personal history of antineoplastic chemotherapy: Secondary | ICD-10-CM

## 2022-06-18 DIAGNOSIS — I959 Hypotension, unspecified: Secondary | ICD-10-CM

## 2022-06-18 DIAGNOSIS — Z7951 Long term (current) use of inhaled steroids: Secondary | ICD-10-CM

## 2022-06-18 DIAGNOSIS — I255 Ischemic cardiomyopathy: Secondary | ICD-10-CM | POA: Diagnosis present

## 2022-06-18 DIAGNOSIS — Z7902 Long term (current) use of antithrombotics/antiplatelets: Secondary | ICD-10-CM

## 2022-06-18 DIAGNOSIS — J1282 Pneumonia due to coronavirus disease 2019: Secondary | ICD-10-CM | POA: Diagnosis present

## 2022-06-18 DIAGNOSIS — J189 Pneumonia, unspecified organism: Secondary | ICD-10-CM

## 2022-06-18 DIAGNOSIS — Z951 Presence of aortocoronary bypass graft: Secondary | ICD-10-CM

## 2022-06-18 DIAGNOSIS — F419 Anxiety disorder, unspecified: Secondary | ICD-10-CM | POA: Diagnosis present

## 2022-06-18 DIAGNOSIS — J9809 Other diseases of bronchus, not elsewhere classified: Secondary | ICD-10-CM | POA: Diagnosis present

## 2022-06-18 DIAGNOSIS — J310 Chronic rhinitis: Secondary | ICD-10-CM | POA: Diagnosis present

## 2022-06-18 DIAGNOSIS — I130 Hypertensive heart and chronic kidney disease with heart failure and stage 1 through stage 4 chronic kidney disease, or unspecified chronic kidney disease: Secondary | ICD-10-CM | POA: Diagnosis present

## 2022-06-18 DIAGNOSIS — J8410 Pulmonary fibrosis, unspecified: Secondary | ICD-10-CM | POA: Diagnosis present

## 2022-06-18 DIAGNOSIS — K219 Gastro-esophageal reflux disease without esophagitis: Secondary | ICD-10-CM | POA: Diagnosis present

## 2022-06-18 DIAGNOSIS — Z515 Encounter for palliative care: Secondary | ICD-10-CM

## 2022-06-18 DIAGNOSIS — R5381 Other malaise: Secondary | ICD-10-CM | POA: Diagnosis present

## 2022-06-18 DIAGNOSIS — R3911 Hesitancy of micturition: Secondary | ICD-10-CM | POA: Diagnosis present

## 2022-06-18 DIAGNOSIS — J9601 Acute respiratory failure with hypoxia: Secondary | ICD-10-CM | POA: Diagnosis present

## 2022-06-18 DIAGNOSIS — Z5320 Procedure and treatment not carried out because of patient's decision for unspecified reasons: Secondary | ICD-10-CM | POA: Diagnosis not present

## 2022-06-18 DIAGNOSIS — Z7952 Long term (current) use of systemic steroids: Secondary | ICD-10-CM

## 2022-06-18 DIAGNOSIS — I4510 Unspecified right bundle-branch block: Secondary | ICD-10-CM | POA: Diagnosis present

## 2022-06-18 DIAGNOSIS — J440 Chronic obstructive pulmonary disease with acute lower respiratory infection: Secondary | ICD-10-CM | POA: Diagnosis present

## 2022-06-18 DIAGNOSIS — D696 Thrombocytopenia, unspecified: Secondary | ICD-10-CM | POA: Diagnosis present

## 2022-06-18 DIAGNOSIS — D6489 Other specified anemias: Secondary | ICD-10-CM | POA: Diagnosis present

## 2022-06-18 DIAGNOSIS — G9340 Encephalopathy, unspecified: Secondary | ICD-10-CM

## 2022-06-18 DIAGNOSIS — L8990 Pressure ulcer of unspecified site, unspecified stage: Secondary | ICD-10-CM | POA: Insufficient documentation

## 2022-06-18 DIAGNOSIS — I2510 Atherosclerotic heart disease of native coronary artery without angina pectoris: Secondary | ICD-10-CM | POA: Diagnosis present

## 2022-06-18 DIAGNOSIS — Z923 Personal history of irradiation: Secondary | ICD-10-CM

## 2022-06-18 DIAGNOSIS — D6959 Other secondary thrombocytopenia: Secondary | ICD-10-CM | POA: Diagnosis present

## 2022-06-18 DIAGNOSIS — R339 Retention of urine, unspecified: Secondary | ICD-10-CM | POA: Insufficient documentation

## 2022-06-18 DIAGNOSIS — N401 Enlarged prostate with lower urinary tract symptoms: Secondary | ICD-10-CM | POA: Diagnosis present

## 2022-06-18 DIAGNOSIS — C3411 Malignant neoplasm of upper lobe, right bronchus or lung: Secondary | ICD-10-CM | POA: Diagnosis present

## 2022-06-18 DIAGNOSIS — I472 Ventricular tachycardia, unspecified: Secondary | ICD-10-CM | POA: Diagnosis not present

## 2022-06-18 DIAGNOSIS — B371 Pulmonary candidiasis: Secondary | ICD-10-CM | POA: Diagnosis present

## 2022-06-18 DIAGNOSIS — K921 Melena: Secondary | ICD-10-CM | POA: Diagnosis not present

## 2022-06-18 DIAGNOSIS — Z66 Do not resuscitate: Secondary | ICD-10-CM | POA: Diagnosis not present

## 2022-06-18 DIAGNOSIS — Z888 Allergy status to other drugs, medicaments and biological substances status: Secondary | ICD-10-CM

## 2022-06-18 DIAGNOSIS — H9190 Unspecified hearing loss, unspecified ear: Secondary | ICD-10-CM | POA: Diagnosis present

## 2022-06-18 DIAGNOSIS — Z8261 Family history of arthritis: Secondary | ICD-10-CM

## 2022-06-18 DIAGNOSIS — E8729 Other acidosis: Secondary | ICD-10-CM | POA: Diagnosis not present

## 2022-06-18 DIAGNOSIS — J9621 Acute and chronic respiratory failure with hypoxia: Secondary | ICD-10-CM | POA: Diagnosis present

## 2022-06-18 DIAGNOSIS — D631 Anemia in chronic kidney disease: Secondary | ICD-10-CM | POA: Diagnosis present

## 2022-06-18 DIAGNOSIS — E440 Moderate protein-calorie malnutrition: Secondary | ICD-10-CM | POA: Diagnosis present

## 2022-06-18 DIAGNOSIS — R4589 Other symptoms and signs involving emotional state: Secondary | ICD-10-CM

## 2022-06-18 DIAGNOSIS — Z0184 Encounter for antibody response examination: Secondary | ICD-10-CM

## 2022-06-18 DIAGNOSIS — Z87891 Personal history of nicotine dependence: Secondary | ICD-10-CM

## 2022-06-18 DIAGNOSIS — Z955 Presence of coronary angioplasty implant and graft: Secondary | ICD-10-CM

## 2022-06-18 DIAGNOSIS — Z6821 Body mass index (BMI) 21.0-21.9, adult: Secondary | ICD-10-CM

## 2022-06-18 DIAGNOSIS — Z85118 Personal history of other malignant neoplasm of bronchus and lung: Secondary | ICD-10-CM

## 2022-06-18 DIAGNOSIS — U071 COVID-19: Principal | ICD-10-CM | POA: Diagnosis present

## 2022-06-18 DIAGNOSIS — I2489 Other forms of acute ischemic heart disease: Secondary | ICD-10-CM | POA: Diagnosis not present

## 2022-06-18 DIAGNOSIS — N1832 Chronic kidney disease, stage 3b: Secondary | ICD-10-CM | POA: Diagnosis present

## 2022-06-18 DIAGNOSIS — Z9981 Dependence on supplemental oxygen: Secondary | ICD-10-CM

## 2022-06-18 DIAGNOSIS — Z9049 Acquired absence of other specified parts of digestive tract: Secondary | ICD-10-CM

## 2022-06-18 DIAGNOSIS — R490 Dysphonia: Secondary | ICD-10-CM | POA: Diagnosis not present

## 2022-06-18 LAB — BASIC METABOLIC PANEL
Anion gap: 7 (ref 5–15)
BUN: 25 mg/dL — ABNORMAL HIGH (ref 8–23)
CO2: 29 mmol/L (ref 22–32)
Calcium: 7.8 mg/dL — ABNORMAL LOW (ref 8.9–10.3)
Chloride: 95 mmol/L — ABNORMAL LOW (ref 98–111)
Creatinine, Ser: 1.76 mg/dL — ABNORMAL HIGH (ref 0.61–1.24)
GFR, Estimated: 38 mL/min — ABNORMAL LOW (ref >60)
Glucose, Bld: 89 mg/dL (ref 70–99)
Potassium: 3.5 mmol/L (ref 3.5–5.1)
Sodium: 131 mmol/L — ABNORMAL LOW (ref 135–145)

## 2022-06-18 LAB — CBC WITH DIFFERENTIAL/PLATELET
Abs Immature Granulocytes: 0.07 10*3/uL (ref 0.00–0.07)
Basophils Absolute: 0 10*3/uL (ref 0.0–0.1)
Basophils Relative: 0 %
Eosinophils Absolute: 0 10*3/uL (ref 0.0–0.5)
Eosinophils Relative: 0 %
HCT: 31.1 % — ABNORMAL LOW (ref 39.0–52.0)
Hemoglobin: 9.7 g/dL — ABNORMAL LOW (ref 13.0–17.0)
Immature Granulocytes: 1 %
Lymphocytes Relative: 4 %
Lymphs Abs: 0.3 10*3/uL — ABNORMAL LOW (ref 0.7–4.0)
MCH: 27.1 pg (ref 26.0–34.0)
MCHC: 31.2 g/dL (ref 30.0–36.0)
MCV: 86.9 fL (ref 80.0–100.0)
Monocytes Absolute: 0.2 10*3/uL (ref 0.1–1.0)
Monocytes Relative: 2 %
Neutro Abs: 7.1 10*3/uL (ref 1.7–7.7)
Neutrophils Relative %: 93 %
Platelets: 71 10*3/uL — ABNORMAL LOW (ref 150–400)
RBC: 3.58 MIL/uL — ABNORMAL LOW (ref 4.22–5.81)
RDW: 17.2 % — ABNORMAL HIGH (ref 11.5–15.5)
WBC: 7.7 10*3/uL (ref 4.0–10.5)
nRBC: 0 % (ref 0.0–0.2)

## 2022-06-18 LAB — BRAIN NATRIURETIC PEPTIDE: B Natriuretic Peptide: 936.6 pg/mL — ABNORMAL HIGH (ref 0.0–100.0)

## 2022-06-18 LAB — MAGNESIUM: Magnesium: 1.9 mg/dL (ref 1.7–2.4)

## 2022-06-18 LAB — BLOOD GAS, VENOUS
Acid-Base Excess: 4.2 mmol/L — ABNORMAL HIGH (ref 0.0–2.0)
Bicarbonate: 31.1 mmol/L — ABNORMAL HIGH (ref 20.0–28.0)
O2 Saturation: 25 %
Patient temperature: 37
pCO2, Ven: 55 mmHg (ref 44–60)
pH, Ven: 7.36 (ref 7.25–7.43)
pO2, Ven: <31 mmHg — CL (ref 32–45)

## 2022-06-18 LAB — TROPONIN I (HIGH SENSITIVITY)
Troponin I (High Sensitivity): 27 ng/L — ABNORMAL HIGH (ref <18)
Troponin I (High Sensitivity): 30 ng/L — ABNORMAL HIGH (ref <18)

## 2022-06-18 LAB — SARS CORONAVIRUS 2 BY RT PCR: SARS Coronavirus 2 by RT PCR: POSITIVE — AB

## 2022-06-18 LAB — GLUCOSE, CAPILLARY: Glucose-Capillary: 101 mg/dL — ABNORMAL HIGH (ref 70–99)

## 2022-06-18 LAB — PROCALCITONIN: Procalcitonin: 0.19 ng/mL

## 2022-06-18 MED ORDER — POTASSIUM CHLORIDE CRYS ER 20 MEQ PO TBCR
40.0000 meq | EXTENDED_RELEASE_TABLET | Freq: Once | ORAL | Status: AC
  Administered 2022-06-18: 40 meq via ORAL
  Filled 2022-06-18: qty 2

## 2022-06-18 MED ORDER — SPIRONOLACTONE 25 MG PO TABS
25.0000 mg | ORAL_TABLET | Freq: Every day | ORAL | Status: DC
  Administered 2022-06-18 – 2022-06-20 (×3): 25 mg via ORAL
  Filled 2022-06-18 (×3): qty 1

## 2022-06-18 MED ORDER — FUROSEMIDE 10 MG/ML IJ SOLN
40.0000 mg | Freq: Two times a day (BID) | INTRAMUSCULAR | Status: DC
  Administered 2022-06-18: 40 mg via INTRAVENOUS
  Filled 2022-06-18: qty 4

## 2022-06-18 MED ORDER — SODIUM CHLORIDE 0.9 % IV SOLN
1.0000 g | INTRAVENOUS | Status: AC
  Administered 2022-06-19 – 2022-06-22 (×4): 1 g via INTRAVENOUS
  Filled 2022-06-18 (×5): qty 10

## 2022-06-18 MED ORDER — SODIUM CHLORIDE 0.9 % IV SOLN
500.0000 mg | INTRAVENOUS | Status: DC
  Administered 2022-06-19 – 2022-06-22 (×4): 500 mg via INTRAVENOUS
  Filled 2022-06-18 (×4): qty 5

## 2022-06-18 MED ORDER — ROSUVASTATIN CALCIUM 20 MG PO TABS
20.0000 mg | ORAL_TABLET | Freq: Every day | ORAL | Status: DC
  Administered 2022-06-24 – 2022-06-26 (×3): 20 mg via ORAL
  Filled 2022-06-18 (×4): qty 1

## 2022-06-18 MED ORDER — FUROSEMIDE 10 MG/ML IJ SOLN
40.0000 mg | Freq: Once | INTRAMUSCULAR | Status: AC
  Administered 2022-06-18: 40 mg via INTRAVENOUS
  Filled 2022-06-18: qty 4

## 2022-06-18 MED ORDER — SODIUM CHLORIDE 0.9 % IV SOLN: 1.0000 g | INTRAVENOUS | Status: DC

## 2022-06-18 MED ORDER — DEXAMETHASONE SODIUM PHOSPHATE 10 MG/ML IJ SOLN
6.0000 mg | Freq: Every day | INTRAMUSCULAR | Status: DC
  Administered 2022-06-18 – 2022-06-20 (×3): 6 mg via INTRAVENOUS
  Filled 2022-06-18 (×3): qty 1

## 2022-06-18 MED ORDER — NITROGLYCERIN 2 % TD OINT
0.5000 [in_us] | TOPICAL_OINTMENT | Freq: Four times a day (QID) | TRANSDERMAL | Status: AC
  Administered 2022-06-18: 0.5 [in_us] via TOPICAL
  Filled 2022-06-18: qty 1

## 2022-06-18 MED ORDER — PANTOPRAZOLE SODIUM 40 MG PO TBEC
40.0000 mg | DELAYED_RELEASE_TABLET | Freq: Every day | ORAL | Status: DC
  Administered 2022-06-18 – 2022-06-27 (×10): 40 mg via ORAL
  Filled 2022-06-18 (×10): qty 1

## 2022-06-18 MED ORDER — ACETAMINOPHEN 325 MG PO TABS
650.0000 mg | ORAL_TABLET | Freq: Four times a day (QID) | ORAL | Status: DC | PRN
  Filled 2022-06-18: qty 2

## 2022-06-18 MED ORDER — ONDANSETRON HCL 4 MG/2ML IJ SOLN: 4.0000 mg | Freq: Four times a day (QID) | INTRAMUSCULAR | Status: DC | PRN

## 2022-06-18 MED ORDER — MOMETASONE FURO-FORMOTEROL FUM 100-5 MCG/ACT IN AERO
2.0000 | INHALATION_SPRAY | Freq: Two times a day (BID) | RESPIRATORY_TRACT | Status: DC
  Administered 2022-06-18 – 2022-06-21 (×6): 2 via RESPIRATORY_TRACT
  Filled 2022-06-18: qty 8.8

## 2022-06-18 MED ORDER — ACETAMINOPHEN 650 MG RE SUPP: 650.0000 mg | Freq: Four times a day (QID) | RECTAL | Status: DC | PRN

## 2022-06-18 MED ORDER — LEVOFLOXACIN 500 MG PO TABS
500.0000 mg | ORAL_TABLET | Freq: Every day | ORAL | Status: DC
  Administered 2022-06-18: 500 mg via ORAL
  Filled 2022-06-18: qty 1

## 2022-06-18 MED ORDER — DAPAGLIFLOZIN PROPANEDIOL 10 MG PO TABS
10.0000 mg | ORAL_TABLET | Freq: Every day | ORAL | Status: DC
  Administered 2022-06-19 – 2022-06-28 (×10): 10 mg via ORAL
  Filled 2022-06-18 (×10): qty 1

## 2022-06-18 MED ORDER — NIRMATRELVIR/RITONAVIR (PAXLOVID) TABLET (RENAL DOSING)
2.0000 | ORAL_TABLET | Freq: Two times a day (BID) | ORAL | Status: DC
  Administered 2022-06-18 – 2022-06-19 (×2): 2 via ORAL
  Filled 2022-06-18: qty 20

## 2022-06-18 MED ORDER — CLOPIDOGREL BISULFATE 75 MG PO TABS
75.0000 mg | ORAL_TABLET | Freq: Every day | ORAL | Status: DC
  Administered 2022-06-18 – 2022-06-27 (×10): 75 mg via ORAL
  Filled 2022-06-18 (×10): qty 1

## 2022-06-18 MED ORDER — SACUBITRIL-VALSARTAN 97-103 MG PO TABS
0.5000 | ORAL_TABLET | Freq: Two times a day (BID) | ORAL | Status: DC
  Administered 2022-06-18: 0.5 via ORAL
  Filled 2022-06-18 (×3): qty 0.5

## 2022-06-18 MED ORDER — CARVEDILOL 6.25 MG PO TABS
18.7500 mg | ORAL_TABLET | Freq: Two times a day (BID) | ORAL | Status: DC
  Administered 2022-06-18 – 2022-06-19 (×3): 18.75 mg via ORAL
  Filled 2022-06-18 (×2): qty 1
  Filled 2022-06-18: qty 2

## 2022-06-18 MED ORDER — ALBUTEROL SULFATE (2.5 MG/3ML) 0.083% IN NEBU
2.5000 mg | INHALATION_SOLUTION | RESPIRATORY_TRACT | Status: DC | PRN
  Administered 2022-06-22: 2.5 mg via RESPIRATORY_TRACT
  Filled 2022-06-18: qty 3

## 2022-06-18 MED ORDER — IPRATROPIUM-ALBUTEROL 0.5-2.5 (3) MG/3ML IN SOLN
3.0000 mL | Freq: Four times a day (QID) | RESPIRATORY_TRACT | Status: DC
  Administered 2022-06-18 – 2022-06-20 (×5): 3 mL via RESPIRATORY_TRACT
  Filled 2022-06-18 (×7): qty 3

## 2022-06-18 MED ORDER — ALBUTEROL SULFATE HFA 108 (90 BASE) MCG/ACT IN AERS
2.0000 | INHALATION_SPRAY | RESPIRATORY_TRACT | Status: DC | PRN
  Administered 2022-06-18: 2 via RESPIRATORY_TRACT
  Filled 2022-06-18: qty 6.7

## 2022-06-18 MED ORDER — SALINE SPRAY 0.65 % NA SOLN
2.0000 | NASAL | Status: DC | PRN
  Administered 2022-06-20 – 2022-07-01 (×3): 2 via NASAL
  Filled 2022-06-18 (×2): qty 44

## 2022-06-18 MED ORDER — ONDANSETRON HCL 4 MG PO TABS: 4.0000 mg | ORAL_TABLET | Freq: Four times a day (QID) | ORAL | Status: DC | PRN

## 2022-06-18 MED ORDER — FUROSEMIDE 10 MG/ML IJ SOLN
40.0000 mg | Freq: Every day | INTRAMUSCULAR | Status: DC
  Administered 2022-06-19 – 2022-06-20 (×2): 40 mg via INTRAVENOUS
  Filled 2022-06-18 (×2): qty 4

## 2022-06-18 NOTE — H&P (Signed)
History and Physical    Patient: Paul Sandoval. QIH:[id number] DOB: 01/28/1941 DOA: 06/01/2022 DOS: the patient was seen and examined on 06/16/2022 PCP: Coral Spikes, DO  Patient coming from: Home  Chief Complaint:  Chief Complaint  Patient presents with   Shortness of Breath   HPI: Paul Sandoval. is a 81 y.o. male with medical history significant of AAA, atherosclerosis, CAD, silent MI, CABG, right bundle branch block, history ischemic cardiomyopathy, chronic combined diastolic and systolic CHF, osteoarthritis, chronic diastolic heart failure, chronic bronchitis, GERD, hyperlipidemia, hypertension, prediabetes, history of pneumonia, tobacco abuse in remission who was discharged on 06/08/2019 5:23 days in the hospital due to exacerbation of heart failure and is returning today with dyspnea, orthopnea and lower extremity edema.  O2 sat at home was 89 to 90%.  He has been compliant with his fluid and sodium intake.  No chest pain, palpitations or diaphoresis. He denied fever, chills, rhinorrhea, sore throat, wheezing or hemoptysis. No abdominal pain, nausea, emesis, diarrhea, constipation, melena or hematochezia.  No flank pain, dysuria, frequency or hematuria.  No polyuria, polydipsia, polyphagia or blurred vision.   ED course: Initial vital signs were temperature 98.4 F, pulse 89, respirations 22, BP 109/63 mmHg O2 sat 93% on nasal cannula oxygen.  The patient received furosemide 40 mg IVP and I added Nitropaste along with KCl 40 mEq p.o. x1 dose.    Lab work: CBC showed a white count of 7.7, hemoglobin 9.7 g/dL and platelets 71.  BMP with a sodium 131, potassium 3.5, chloride 95 CO2 29 mmol/L.  Glucose 89, BUN 25, creatinine 1.76 and calcium 7.8 mg/dL.  BNP was 936.6 pg/mL and troponin 39 mg/L.  Imaging: Two-view chest radiograph with a new airspace opacity within the left lower lobe concerning for either pneumonia or aspiration.  No changes in aeration of the right lung compared  with previous exam.   Review of Systems: As mentioned in the history of present illness. All other systems reviewed and are negative. Past Medical History:  Diagnosis Date   AAA (abdominal aortic aneurysm) ([place]) 2010   4.4 cm 08/2008;4.44 in 7/10 and 4.65 in 08/2009; 4.8 by CT in 11/2009; 4.3 by ultrasound in 08/2010   Anemia    Arteriosclerotic cardiovascular disease (ASCVD) 1996   CABG-1996   Arthritis    "fingers" (03/18/2014)   CAD (coronary artery disease)    03/18/14:  PCI with DES to distal left main. 7/29: DES to the SVG to Diag   Cancer [facility])    Upper right lobe lung cancer   Cardiomyopathy, ischemic    Echo 03/17/14: EF 45-50%   CHF (congestive heart failure) (HCC)    Chronic bronchitis (HCC)    Chronic kidney disease    CRF   Chronic rhinitis    Colonic polyp 2002   polypectomy in 2002   COPD (chronic obstructive pulmonary disease) ([place])    Diverticulosis    Dyspnea    with exertion   ED (erectile dysfunction)    Encounter for antineoplastic chemotherapy 12/19/2015   GERD (gastroesophageal reflux disease)    History of blood transfusion    Hyperlipidemia    Hypertension    IFG (impaired fasting glucose)    Myocardial infarction [facility])    "told h/o silent MI sometime before 1996"   Pneumonia ~ 2001; ~ 2005    has had more than twice   Right bundle branch block    Tobacco abuse, in remission    40 pack year  total consumption; discontinued in 1996   Past Surgical History:  Procedure Laterality Date   ABDOMINAL AORTIC ANEURYSM REPAIR  11/2012   ABDOMINAL AORTIC ENDOVASCULAR STENT GRAFT N/A 12/11/2012   Procedure: ABDOMINAL AORTIC ENDOVASCULAR STENT GRAFT;  Surgeon: Mal Misty, MD;  Location: [place];  Service: Vascular;  Laterality: N/A;  Ultrasound guided; [place]  01/08/1995   COLONOSCOPY  2002   polypectomy-patient denies   CORONARY ANGIOPLASTY WITH STENT PLACEMENT  03/18/2014   "1"   CORONARY ANGIOPLASTY WITH STENT PLACEMENT  03/24/2014    "1"   CORONARY ARTERY BYPASS GRAFT  01/09/1995   "CABG X3"   FLEXIBLE BRONCHOSCOPY N/A 03/01/2017   Procedure: FLEXIBLE BRONCHOSCOPY WITH BIOPSIES;  Surgeon: Gaye Pollack, MD;  Location: [place] OR;  Service: Thoracic;  Laterality: N/A;   JOINT REPLACEMENT     LAPAROSCOPIC CHOLECYSTECTOMY  12/2009   LEFT AND RIGHT HEART CATHETERIZATION WITH CORONARY/GRAFT ANGIOGRAM N/A 03/18/2014   Procedure: LEFT AND RIGHT HEART CATHETERIZATION WITH Beatrix Fetters;  Surgeon: Blane Ohara, MD;  Location: [facility] CATH LAB;  Service: Cardiovascular;  Laterality: N/A;   PERCUTANEOUS CORONARY STENT INTERVENTION (PCI-S)  03/18/2014   Procedure: PERCUTANEOUS CORONARY STENT INTERVENTION (PCI-S);  Surgeon: Blane Ohara, MD;  Location: [facility] CATH LAB;  Service: Cardiovascular;;   PERCUTANEOUS CORONARY STENT INTERVENTION (PCI-S) N/A 03/24/2014   Procedure: PERCUTANEOUS CORONARY STENT INTERVENTION (PCI-S);  Surgeon: Blane Ohara, MD;  Location: [facility] CATH LAB;  Service: Cardiovascular;  Laterality: N/A;   PLEURAL EFFUSION DRAINAGE Right 03/01/2017   Procedure: DRAINAGE OF PLEURAL EFFUSION;  Surgeon: Gaye Pollack, MD;  Location: [place] OR;  Service: Thoracic;  Laterality: Right;   RIGHT/LEFT HEART CATH AND CORONARY/GRAFT ANGIOGRAPHY N/A 05/15/2017   Procedure: RIGHT/LEFT HEART CATH AND CORONARY/GRAFT ANGIOGRAPHY;  Surgeon: Larey Dresser, MD;  Location: [place] CV LAB;  Service: Cardiovascular;  Laterality: N/A;   TALC PLEURODESIS Right 03/01/2017   Procedure: Pietro Cassis;  Surgeon: Gaye Pollack, MD;  Location: [place];  Service: Thoracic;  Laterality: Right;   TOTAL HIP ARTHROPLASTY Left 01/21/2013   Procedure: TOTAL HIP ARTHROPLASTY ANTERIOR APPROACH;  Surgeon: Mauri Pole, MD;  Location: [place];  Service: Orthopedics;  Laterality: Left;   VIDEO ASSISTED THORACOSCOPY Right 03/01/2017   Procedure: VIDEO ASSISTED THORACOSCOPY WITH BIOPSIES;  Surgeon: Gaye Pollack, MD;  Location: [place];  Service: Thoracic;   Laterality: Right;   VIDEO BRONCHOSCOPY N/A 11/17/2015   Procedure: VIDEO BRONCHOSCOPY WITH FLUORO;  Surgeon: Rigoberto Noel, MD;  Location: [place];  Service: Cardiopulmonary;  Laterality: N/A;   Social History:  reports that he quit smoking about 27 years ago. His smoking use included cigarettes. He started smoking about 65 years ago. He has a 45.00 pack-year smoking history. He has never used smokeless tobacco. He reports that he does not drink alcohol and does not use drugs.  Allergies  Allergen Reactions   Neomycin Hives   Anoro Ellipta [Umeclidinium-Vilanterol] Itching    Rash over face, neck   Stiolto Respimat [Tiotropium Bromide-Olodaterol] Itching    Rash over face , neck   Cetirizine & Related Rash    Family History  Problem Relation Age of Onset   Heart disease Father    Cancer Father        Lung   Arthritis Mother    Parkinsonism Mother    Arthritis Sister        Brother with rheumatoid arthritis   Hypertension Brother    Colon  cancer Neg Hx    Colon polyps Neg Hx     Prior to Admission medications   Medication Sig Start Date End Date Taking? Authorizing Provider  acetaminophen (TYLENOL) 500 MG tablet Take 1,000 mg by mouth every 6 (six) hours as needed for fever.    [provider]  albuterol (VENTOLIN HFA) 108 (90 Base) MCG/ACT inhaler Inhale 2 puffs into the lungs every 6 (six) hours as needed for wheezing or shortness of breath. 01/01/22   Coral Spikes, DO  carvedilol (COREG) 12.5 MG tablet TAKE 1 &1/2 TABLET BY MOUTH TWICE DAILY WITH A MEAL Patient taking differently: Take 18.75 mg by mouth 2 (two) times daily with a meal. 02/12/22   Larey Dresser, MD  clopidogrel (PLAVIX) 75 MG tablet TAKE (1) TABLET BY MOUTH ONCE DAILY. Patient taking differently: Take 75 mg by mouth daily. 01/23/22   Larey Dresser, MD  dapagliflozin propanediol (FARXIGA) 10 MG TABS tablet Take 10 mg by mouth daily before breakfast. 08/27/19   Larey Dresser, MD   Dextromethorphan-guaiFENesin (ROBITUSSIN DM PO) Take 20 mLs by mouth daily as needed (for cough).    [provider]  eplerenone (INSPRA) 50 MG tablet Take 1 tablet (50 mg total) by mouth every evening. 04/05/20   Larey Dresser, MD  fluticasone [facility]) 50 MCG/ACT nasal spray Place 1 spray into both nostrils daily as needed for allergies or rhinitis. 11/16/21   Rigoberto Noel, MD  fluticasone-salmeterol (WIXELA INHUB) 100-50 MCG/ACT AEPB Inhale 1 puff into the lungs 2 (two) times daily. 12/11/21   Rigoberto Noel, MD  furosemide (LASIX) 20 MG tablet Take 20 mg by mouth daily.    [provider]  ipratropium-albuterol (DUONEB) 0.5-2.5 (3) MG/3ML SOLN Take 3 mLs by nebulization in the morning, at noon, and at bedtime. 11/10/21   Rigoberto Noel, MD  levofloxacin (LEVAQUIN) 500 MG tablet Take 1 tablet (500 mg total) by mouth daily for 7 days. 06/14/22 06/21/22  Rigoberto Noel, MD  nitroGLYCERIN (NITROSTAT) 0.4 MG SL tablet PLACE 1 TAB UNDER TONGUE EVERY 5 MIN IF NEEDED FOR CHEST PAIN. MAY USE 3 TIMES.NO RELIEF CALL 911. 01/01/22   Coral Spikes, DO  nystatin (MYCOSTATIN) 100000 UNIT/ML suspension Take 5 mLs (500,000 Units total) by mouth 4 (four) times daily. 03/06/22   Rigoberto Noel, MD  omeprazole (PRILOSEC) 20 MG capsule Take 1 capsule (20 mg total) by mouth daily. 03/14/22   Coral Spikes, DO  rosuvastatin (CRESTOR) 20 MG tablet TAKE ONE TABLET BY MOUTH ONCE DAILY. Patient taking differently: Take 20 mg by mouth daily. 03/20/22   Larey Dresser, MD  sacubitril-valsartan (ENTRESTO) 97-103 MG Take 0.5 tablets by mouth 2 (two) times daily. 07/13/20   [provider]  sodium chloride (OCEAN) 0.65 % SOLN nasal spray Place 1 spray into both nostrils as needed for congestion.    [provider]    Physical Exam: Vitals:   06/17/2022 0830 06/13/2022 0900 06/15/2022 0930 06/17/2022 0946  BP: (!) 128/48 131/62 (!) 144/58   Pulse: 91 91 86   Resp: 20 20 (!) 22   Temp:    98.6 F  (37 C)  TempSrc:    Oral  SpO2: 95% 94% 95%   Weight:      Height:       Physical Exam Vitals and nursing note reviewed.  Constitutional:      Appearance: He is well-developed. He is ill-appearing.     Interventions: Nasal  cannula in place.  HENT:     Head: Normocephalic.     Nose: No rhinorrhea.     Mouth/Throat:     Mouth: Mucous membranes are moist.  Eyes:     General: No scleral icterus.    Pupils: Pupils are equal, round, and reactive to light.  Neck:     Vascular: No JVD.  Cardiovascular:     Rate and Rhythm: Normal rate and regular rhythm.     Heart sounds: S1 normal and S2 normal.  Pulmonary:     Effort: Tachypnea present. No accessory muscle usage.     Breath sounds: Examination of the right-lower field reveals rales. Examination of the left-lower field reveals rales. Rales present. No wheezing or rhonchi.  Abdominal:     General: Bowel sounds are normal. There is no distension.     Palpations: Abdomen is soft.     Tenderness: There is no abdominal tenderness.  Musculoskeletal:     Cervical back: Neck supple.     Right lower leg: Edema present.     Left lower leg: Edema present.  Skin:    General: Skin is warm and dry.  Neurological:     General: No focal deficit present.     Mental Status: He is alert and oriented to person, place, and time.  Psychiatric:        Mood and Affect: Mood is anxious.   Data Reviewed:  There are no new results to review at this time.  Echocardiogram on 03/28/2022:  IMPRESSIONS:    1. Left ventricular ejection fraction, by estimation, is 30 to 35%. The  left ventricle has moderately decreased function. The left ventricle  demonstrates global hypokinesis. Left ventricular diastolic parameters are  consistent with Grade I diastolic  dysfunction (impaired relaxation).   2. Right ventricular systolic function is mildly reduced. The right  ventricular size is normal. Tricuspid regurgitation signal is inadequate  for assessing  PA pressure.   3. Left atrial size was mildly dilated.   4. Right atrial size was mildly dilated.   5. The mitral valve is normal in structure. No evidence of mitral valve  regurgitation. No evidence of mitral stenosis.   6. The aortic valve is tricuspid. There is mild calcification of the  aortic valve. Aortic valve regurgitation is not visualized. No aortic  stenosis is present.   7. The inferior vena cava is normal in size with greater than 50%  respiratory variability, suggesting right atrial pressure of 3 mmHg.   Assessment and Plan: Principal Problem:   Acute respiratory failure with hypoxia ([place]) In the setting of:   Acute on chronic combined systolic and diastolic congestive heart failure (HCC) due to ischemic cardiomyopathy Observation/telemetry. Supplemental oxygen as needed. Sodium and fluid restriction. Continue furosemide 40 mg IVP twice daily. Monitor daily weights, intake and output. Monitor renal function electrolytes.  With superimposed:   Acute exacerbation of chronic obstructive pulmonary disease (COPD) ([place]) Secondary to: CHF as above and COVID-19 virus infection Supplemental oxygen as needed. Bronchodilators as needed. Incentive spirometry while awake. Flutter valve exercises. Antitussives as needed. Vitamin C/zinc supplementation Dexamethasone 6 mg IVP daily. Molnupiravir p.o. twice daily. Begin ceftriaxone and azithromycin. Check procalcitonin level. Follow-up CBC, CMP and chemistry. Patient on Farxiga. Check CBG 4 times daily while on glucocorticoids. Begin regular insulin sliding scale if glucose trends up.  Active Problems:   CAD (coronary artery disease) Continue carvedilol, clopidogrel and rosuvastatin. Follow-up with cardiology as an outpatient.    HLD (hyperlipidemia) Continue  rosuvastatin 20 mg p.o. daily.    Essential hypertension Continue carvedilol and Entresto. Monitor BP, heart rate, renal function electrolytes.    GERD  (gastroesophageal reflux disease) Continue PPI.    Thrombocytopenia (HCC) Monitor platelet count.    Stage 3b chronic kidney disease (CKD) (HCC) Monitor renal function electrolytes.      Advance Care Planning:   Code Status: Full code.   Consults:   Family Communication:   Severity of Illness: The appropriate patient status for this patient is OBSERVATION. Observation status is judged to be reasonable and necessary in order to provide the required intensity of service to ensure the patient's safety. The patient's presenting symptoms, physical exam findings, and initial radiographic and laboratory data in the context of their medical condition is felt to place them at decreased risk for further clinical deterioration. Furthermore, it is anticipated that the patient will be medically stable for discharge from the hospital within 2 midnights of admission.   Author: Reubin Milan, MD 06/15/2022 10:08 AM  For on call review [email or web address].   This document was prepared using Dragon voice recognition software and may contain some unintended transcription errors.

## 2022-06-18 NOTE — ED Notes (Signed)
PO2 <31 notified MD

## 2022-06-18 NOTE — ED Notes (Addendum)
Pt became increasingly SOB while toileting and while speaking to the pharmacist. Pharmacist will follow up soon. Pt now sitting in bedside chair and breathing has returned to baseline.

## 2022-06-18 NOTE — ED Provider Notes (Signed)
[place] DEPT Provider Note   CSN: [id number] Arrival date & time: 06/03/2022  0524     History  Chief Complaint  Patient presents with   Shortness of Breath    Paul Sandoval. is a 81 y.o. male.  Patient presents to the emergency department for evaluation of shortness of breath.  Patient reports that he was admitted to the hospital for a COPD exacerbation recently.  He followed up with his pulmonologist after the hospital visit and was started on home oxygen.  Patient reports that over the last couple of days he has become progressively more short of breath.  Patient has severe dyspnea on exertion as well as orthopnea.  Patient reports that his feet and lower legs are much more swollen than usual.       Home Medications Prior to Admission medications   Medication Sig Start Date End Date Taking? Authorizing Provider  acetaminophen (TYLENOL) 500 MG tablet Take 1,000 mg by mouth every 6 (six) hours as needed for fever.    [provider]  albuterol (VENTOLIN HFA) 108 (90 Base) MCG/ACT inhaler Inhale 2 puffs into the lungs every 6 (six) hours as needed for wheezing or shortness of breath. 01/01/22   Coral Spikes, DO  carvedilol (COREG) 12.5 MG tablet TAKE 1 &1/2 TABLET BY MOUTH TWICE DAILY WITH A MEAL Patient taking differently: Take 18.75 mg by mouth 2 (two) times daily with a meal. 02/12/22   Larey Dresser, MD  clopidogrel (PLAVIX) 75 MG tablet TAKE (1) TABLET BY MOUTH ONCE DAILY. Patient taking differently: Take 75 mg by mouth daily. 01/23/22   Larey Dresser, MD  dapagliflozin propanediol (FARXIGA) 10 MG TABS tablet Take 10 mg by mouth daily before breakfast. 08/27/19   Larey Dresser, MD  Dextromethorphan-guaiFENesin (ROBITUSSIN DM PO) Take 20 mLs by mouth daily as needed (for cough).    [provider]  eplerenone (INSPRA) 50 MG tablet Take 1 tablet (50 mg total) by mouth every evening. 04/05/20   Larey Dresser, MD   fluticasone [facility]) 50 MCG/ACT nasal spray Place 1 spray into both nostrils daily as needed for allergies or rhinitis. 11/16/21   Rigoberto Noel, MD  fluticasone-salmeterol (WIXELA INHUB) 100-50 MCG/ACT AEPB Inhale 1 puff into the lungs 2 (two) times daily. 12/11/21   Rigoberto Noel, MD  furosemide (LASIX) 20 MG tablet Take 20 mg by mouth daily.    [provider]  ipratropium-albuterol (DUONEB) 0.5-2.5 (3) MG/3ML SOLN Take 3 mLs by nebulization in the morning, at noon, and at bedtime. 11/10/21   Rigoberto Noel, MD  levofloxacin (LEVAQUIN) 500 MG tablet Take 1 tablet (500 mg total) by mouth daily for 7 days. 06/14/22 06/21/22  Rigoberto Noel, MD  nitroGLYCERIN (NITROSTAT) 0.4 MG SL tablet PLACE 1 TAB UNDER TONGUE EVERY 5 MIN IF NEEDED FOR CHEST PAIN. MAY USE 3 TIMES.NO RELIEF CALL 911. 01/01/22   Coral Spikes, DO  nystatin (MYCOSTATIN) 100000 UNIT/ML suspension Take 5 mLs (500,000 Units total) by mouth 4 (four) times daily. 03/06/22   Rigoberto Noel, MD  omeprazole (PRILOSEC) 20 MG capsule Take 1 capsule (20 mg total) by mouth daily. 03/14/22   Coral Spikes, DO  rosuvastatin (CRESTOR) 20 MG tablet TAKE ONE TABLET BY MOUTH ONCE DAILY. Patient taking differently: Take 20 mg by mouth daily. 03/20/22   Larey Dresser, MD  sacubitril-valsartan (ENTRESTO) 97-103 MG Take 0.5 tablets by mouth 2 (two) times daily. 07/13/20  [provider]  sodium chloride (OCEAN) 0.65 % SOLN nasal spray Place 1 spray into both nostrils as needed for congestion.    [provider]      Allergies    Neomycin, Anoro ellipta [umeclidinium-vilanterol], Stiolto respimat [tiotropium bromide-olodaterol], and Cetirizine & related    Review of Systems   Review of Systems  Physical Exam Updated Vital Signs BP (!) 136/59   Pulse 93   Temp 98.4 F (36.9 C)   Resp (!) 21   Ht 5\' 10"  (1.778 m)   Wt 78.9 kg   SpO2 99%   BMI 24.97 kg/m  Physical Exam Vitals and nursing note reviewed.   Constitutional:      General: He is not in acute distress.    Appearance: He is well-developed.  HENT:     Head: Normocephalic and atraumatic.     Mouth/Throat:     Mouth: Mucous membranes are moist.  Eyes:     General: Vision grossly intact. Gaze aligned appropriately.     Extraocular Movements: Extraocular movements intact.     Conjunctiva/sclera: Conjunctivae normal.  Cardiovascular:     Rate and Rhythm: Normal rate and regular rhythm.     Pulses: Normal pulses.     Heart sounds: Normal heart sounds, S1 normal and S2 normal. No murmur heard.    No friction rub. No gallop.  Pulmonary:     Effort: Pulmonary effort is normal. No respiratory distress.     Breath sounds: Normal breath sounds.  Abdominal:     Palpations: Abdomen is soft.     Tenderness: There is no abdominal tenderness. There is no guarding or rebound.     Hernia: No hernia is present.  Musculoskeletal:        General: No swelling.     Cervical back: Full passive range of motion without pain, normal range of motion and neck supple. No pain with movement, spinous process tenderness or muscular tenderness. Normal range of motion.     Right lower leg: Edema present.     Left lower leg: Edema present.  Skin:    General: Skin is warm and dry.     Capillary Refill: Capillary refill takes less than 2 seconds.     Findings: No ecchymosis, erythema, lesion or wound.  Neurological:     Mental Status: He is alert and oriented to person, place, and time.     GCS: GCS eye subscore is 4. GCS verbal subscore is 5. GCS motor subscore is 6.     Cranial Nerves: Cranial nerves 2-12 are intact.     Sensory: Sensation is intact.     Motor: Motor function is intact. No weakness or abnormal muscle tone.     Coordination: Coordination is intact.  Psychiatric:        Mood and Affect: Mood normal.        Speech: Speech normal.        Behavior: Behavior normal.     ED Results / Procedures / Treatments   Labs (all labs ordered are  listed, but only abnormal results are displayed) Labs Reviewed  BLOOD GAS, VENOUS - Abnormal; Notable for the following components:      Result Value   pO2, Ven <31 (*)    Bicarbonate 31.1 (*)    Acid-Base Excess 4.2 (*)    All other components within normal limits  CBC WITH DIFFERENTIAL/PLATELET  BASIC METABOLIC PANEL  BRAIN NATRIURETIC PEPTIDE  TROPONIN I (HIGH SENSITIVITY)    EKG  EKG Interpretation  Date/Time:  Monday June 18 2022 05:38:40 EDT Ventricular Rate:  89 PR Interval:  173 QRS Duration: 149 QT Interval:  407 QTC Calculation: 496 R Axis:   108 Text Interpretation: Sinus rhythm Ventricular premature complex Consider left atrial enlargement Right bundle branch block Baseline wander in lead(s) V1 No significant change since last tracing Confirmed by Orpah Greek [phone]) on 06/13/2022 5:47:57 AM  Radiology No results found.  Procedures Procedures    Medications Ordered in ED Medications  albuterol (VENTOLIN HFA) 108 (90 Base) MCG/ACT inhaler 2 puff (has no administration in time range)    ED Course/ Medical Decision Making/ A&P Clinical Course as of 06/22/2022 0715  Mon Jun 18, 2022  0701 Stable 81 YOM with comorbid CHF and COPD here with chief complaint of SOB. On 2LNC currently.  [CC]    Clinical Course User Index [CC] Tretha Sciara, MD                           Medical Decision Making Amount and/or Complexity of Data Reviewed External Data Reviewed: labs, radiology, ECG and notes. Labs: ordered. Decision-making details documented in ED Course. Radiology: ordered and independent interpretation performed. Decision-making details documented in ED Course. ECG/medicine tests: ordered and independent interpretation performed. Decision-making details documented in ED Course.  Risk Prescription drug management.   Patient presents to the emergency department for evaluation of increasing shortness of breath.  Patient has a known history of  COPD and congestive heart failure.  He was recently hospitalized for COPD exacerbation and has qualified for home oxygen.  Differential diagnosis considered includes COPD exacerbation, pneumonia, CHF.  Patient with increased lower extremity had pitting edema and increasing oxygen demand.  Work-up initiated.  Will sign out to oncoming ER physician to follow-up on results.        Final Clinical Impression(s) / ED Diagnoses Final diagnoses:  Shortness of breath    Rx / DC Orders ED Discharge Orders     None         Shavontae Gibeault, Gwenyth Allegra, MD 06/22/2022 [phone]

## 2022-06-18 NOTE — ED Notes (Signed)
On 2L @ home

## 2022-06-18 NOTE — ED Provider Notes (Signed)
Care of patient received from prior provider at 0 700, please see their note for complete H&P.  In short this is a patient presenting with shortness of breath in the setting of CHF, COPD.  He was admitted last week for COPD exacerbation, he has gained approximately 10 pounds over the weekend which she associates to lower extremity swelling.  He feels short of breath and had been titrated off his oxygen but is now requiring 2 L permanently.  He takes 20 mg Lasix daily at home and has been taking it without much urinary output. Patient's son notes that he appears tachypneic and very short of breath which is worse than his baseline.  Shared medical decision making.  Offered IV diuresis and attempt at discharge since the patient has follow-up with his pulmonologist today.  The family would feel more comfortable patient being admitted to the hospital.  Overall given concern for worsening shortness of breath, this is reasonable.  Patient arranged for admission with IV diuresis and ongoing care management.   Tretha Sciara, MD 06/20/2022 1245

## 2022-06-18 NOTE — ED Triage Notes (Signed)
Pt c/o SHOB since last week. Pt was recently admitted for COPD exacerbation and placed on home O2. Pt states O2 on RA ranging from 89-90%.

## 2022-06-19 DIAGNOSIS — J189 Pneumonia, unspecified organism: Secondary | ICD-10-CM | POA: Diagnosis not present

## 2022-06-19 DIAGNOSIS — E871 Hypo-osmolality and hyponatremia: Secondary | ICD-10-CM | POA: Diagnosis present

## 2022-06-19 DIAGNOSIS — J440 Chronic obstructive pulmonary disease with acute lower respiratory infection: Secondary | ICD-10-CM | POA: Diagnosis present

## 2022-06-19 DIAGNOSIS — J90 Pleural effusion, not elsewhere classified: Secondary | ICD-10-CM | POA: Diagnosis present

## 2022-06-19 DIAGNOSIS — E785 Hyperlipidemia, unspecified: Secondary | ICD-10-CM

## 2022-06-19 DIAGNOSIS — J9601 Acute respiratory failure with hypoxia: Secondary | ICD-10-CM | POA: Diagnosis not present

## 2022-06-19 DIAGNOSIS — J1282 Pneumonia due to coronavirus disease 2019: Secondary | ICD-10-CM | POA: Diagnosis present

## 2022-06-19 DIAGNOSIS — R0603 Acute respiratory distress: Secondary | ICD-10-CM | POA: Diagnosis not present

## 2022-06-19 DIAGNOSIS — B371 Pulmonary candidiasis: Secondary | ICD-10-CM | POA: Diagnosis present

## 2022-06-19 DIAGNOSIS — D631 Anemia in chronic kidney disease: Secondary | ICD-10-CM

## 2022-06-19 DIAGNOSIS — K921 Melena: Secondary | ICD-10-CM | POA: Diagnosis not present

## 2022-06-19 DIAGNOSIS — D696 Thrombocytopenia, unspecified: Secondary | ICD-10-CM

## 2022-06-19 DIAGNOSIS — J9621 Acute and chronic respiratory failure with hypoxia: Secondary | ICD-10-CM | POA: Diagnosis present

## 2022-06-19 DIAGNOSIS — U071 COVID-19: Secondary | ICD-10-CM | POA: Diagnosis present

## 2022-06-19 DIAGNOSIS — K219 Gastro-esophageal reflux disease without esophagitis: Secondary | ICD-10-CM

## 2022-06-19 DIAGNOSIS — I5043 Acute on chronic combined systolic (congestive) and diastolic (congestive) heart failure: Secondary | ICD-10-CM | POA: Diagnosis present

## 2022-06-19 DIAGNOSIS — I2489 Other forms of acute ischemic heart disease: Secondary | ICD-10-CM | POA: Diagnosis not present

## 2022-06-19 DIAGNOSIS — N1832 Chronic kidney disease, stage 3b: Secondary | ICD-10-CM | POA: Diagnosis present

## 2022-06-19 DIAGNOSIS — Z515 Encounter for palliative care: Secondary | ICD-10-CM | POA: Diagnosis not present

## 2022-06-19 DIAGNOSIS — E440 Moderate protein-calorie malnutrition: Secondary | ICD-10-CM | POA: Diagnosis present

## 2022-06-19 DIAGNOSIS — D6959 Other secondary thrombocytopenia: Secondary | ICD-10-CM | POA: Diagnosis present

## 2022-06-19 DIAGNOSIS — R7989 Other specified abnormal findings of blood chemistry: Secondary | ICD-10-CM | POA: Diagnosis not present

## 2022-06-19 DIAGNOSIS — J441 Chronic obstructive pulmonary disease with (acute) exacerbation: Secondary | ICD-10-CM

## 2022-06-19 DIAGNOSIS — I255 Ischemic cardiomyopathy: Secondary | ICD-10-CM

## 2022-06-19 DIAGNOSIS — Z951 Presence of aortocoronary bypass graft: Secondary | ICD-10-CM

## 2022-06-19 DIAGNOSIS — Z7189 Other specified counseling: Secondary | ICD-10-CM | POA: Diagnosis not present

## 2022-06-19 DIAGNOSIS — J690 Pneumonitis due to inhalation of food and vomit: Secondary | ICD-10-CM | POA: Diagnosis not present

## 2022-06-19 DIAGNOSIS — R609 Edema, unspecified: Secondary | ICD-10-CM | POA: Diagnosis not present

## 2022-06-19 DIAGNOSIS — I2510 Atherosclerotic heart disease of native coronary artery without angina pectoris: Secondary | ICD-10-CM

## 2022-06-19 DIAGNOSIS — E8729 Other acidosis: Secondary | ICD-10-CM | POA: Diagnosis not present

## 2022-06-19 DIAGNOSIS — J9622 Acute and chronic respiratory failure with hypercapnia: Secondary | ICD-10-CM | POA: Diagnosis not present

## 2022-06-19 DIAGNOSIS — I10 Essential (primary) hypertension: Secondary | ICD-10-CM

## 2022-06-19 DIAGNOSIS — Z66 Do not resuscitate: Secondary | ICD-10-CM | POA: Diagnosis not present

## 2022-06-19 DIAGNOSIS — C3411 Malignant neoplasm of upper lobe, right bronchus or lung: Secondary | ICD-10-CM

## 2022-06-19 DIAGNOSIS — I130 Hypertensive heart and chronic kidney disease with heart failure and stage 1 through stage 4 chronic kidney disease, or unspecified chronic kidney disease: Secondary | ICD-10-CM | POA: Diagnosis present

## 2022-06-19 DIAGNOSIS — I472 Ventricular tachycardia, unspecified: Secondary | ICD-10-CM | POA: Diagnosis not present

## 2022-06-19 DIAGNOSIS — I358 Other nonrheumatic aortic valve disorders: Secondary | ICD-10-CM | POA: Diagnosis present

## 2022-06-19 DIAGNOSIS — T17590A Other foreign object in bronchus causing asphyxiation, initial encounter: Secondary | ICD-10-CM | POA: Diagnosis present

## 2022-06-19 DIAGNOSIS — R0602 Shortness of breath: Secondary | ICD-10-CM | POA: Diagnosis present

## 2022-06-19 DIAGNOSIS — Z95828 Presence of other vascular implants and grafts: Secondary | ICD-10-CM

## 2022-06-19 LAB — COMPREHENSIVE METABOLIC PANEL
ALT: 19 U/L (ref 0–44)
AST: 23 U/L (ref 15–41)
Albumin: 2.3 g/dL — ABNORMAL LOW (ref 3.5–5.0)
Alkaline Phosphatase: 65 U/L (ref 38–126)
Anion gap: 9 (ref 5–15)
BUN: 29 mg/dL — ABNORMAL HIGH (ref 8–23)
CO2: 29 mmol/L (ref 22–32)
Calcium: 7.8 mg/dL — ABNORMAL LOW (ref 8.9–10.3)
Chloride: 92 mmol/L — ABNORMAL LOW (ref 98–111)
Creatinine, Ser: 1.83 mg/dL — ABNORMAL HIGH (ref 0.61–1.24)
GFR, Estimated: 36 mL/min — ABNORMAL LOW (ref >60)
Glucose, Bld: 152 mg/dL — ABNORMAL HIGH (ref 70–99)
Potassium: 3.8 mmol/L (ref 3.5–5.1)
Sodium: 130 mmol/L — ABNORMAL LOW (ref 135–145)
Total Bilirubin: 0.6 mg/dL (ref 0.3–1.2)
Total Protein: 6.2 g/dL — ABNORMAL LOW (ref 6.5–8.1)

## 2022-06-19 LAB — CBC WITH DIFFERENTIAL/PLATELET
Abs Immature Granulocytes: 0.05 10*3/uL (ref 0.00–0.07)
Basophils Absolute: 0 10*3/uL (ref 0.0–0.1)
Basophils Relative: 0 %
Eosinophils Absolute: 0 10*3/uL (ref 0.0–0.5)
Eosinophils Relative: 0 %
HCT: 32 % — ABNORMAL LOW (ref 39.0–52.0)
Hemoglobin: 9.9 g/dL — ABNORMAL LOW (ref 13.0–17.0)
Immature Granulocytes: 1 %
Lymphocytes Relative: 4 %
Lymphs Abs: 0.2 10*3/uL — ABNORMAL LOW (ref 0.7–4.0)
MCH: 26.6 pg (ref 26.0–34.0)
MCHC: 30.9 g/dL (ref 30.0–36.0)
MCV: 86 fL (ref 80.0–100.0)
Monocytes Absolute: 0.1 10*3/uL (ref 0.1–1.0)
Monocytes Relative: 1 %
Neutro Abs: 5.4 10*3/uL (ref 1.7–7.7)
Neutrophils Relative %: 94 %
Platelets: 68 10*3/uL — ABNORMAL LOW (ref 150–400)
RBC: 3.72 MIL/uL — ABNORMAL LOW (ref 4.22–5.81)
RDW: 17.5 % — ABNORMAL HIGH (ref 11.5–15.5)
WBC: 5.7 10*3/uL (ref 4.0–10.5)
nRBC: 0 % (ref 0.0–0.2)

## 2022-06-19 LAB — PROCALCITONIN: Procalcitonin: 0.17 ng/mL

## 2022-06-19 LAB — CK: Total CK: 21 U/L — ABNORMAL LOW (ref 49–397)

## 2022-06-19 LAB — GLUCOSE, CAPILLARY
Glucose-Capillary: 173 mg/dL — ABNORMAL HIGH (ref 70–99)
Glucose-Capillary: 153 mg/dL — ABNORMAL HIGH (ref 70–99)
Glucose-Capillary: 149 mg/dL — ABNORMAL HIGH (ref 70–99)
Glucose-Capillary: 170 mg/dL — ABNORMAL HIGH (ref 70–99)

## 2022-06-19 LAB — C-REACTIVE PROTEIN: CRP: 14.9 mg/dL — ABNORMAL HIGH (ref <1.0)

## 2022-06-19 LAB — FERRITIN: Ferritin: 2703 ng/mL — ABNORMAL HIGH (ref 24–336)

## 2022-06-19 LAB — PHOSPHORUS: Phosphorus: 4.9 mg/dL — ABNORMAL HIGH (ref 2.5–4.6)

## 2022-06-19 LAB — D-DIMER, QUANTITATIVE: D-Dimer, Quant: 2.49 ug/mL-FEU — ABNORMAL HIGH (ref 0.00–0.50)

## 2022-06-19 LAB — MAGNESIUM: Magnesium: 2.1 mg/dL (ref 1.7–2.4)

## 2022-06-19 MED ORDER — MOLNUPIRAVIR EUA 200MG CAPSULE
4.0000 | ORAL_CAPSULE | Freq: Two times a day (BID) | ORAL | Status: AC
  Administered 2022-06-19 – 2022-06-24 (×9): 800 mg via ORAL
  Filled 2022-06-19: qty 4

## 2022-06-19 NOTE — Progress Notes (Signed)
Notified MD Gonfa of bladder scan volume of 386. MD advised to "Let as give him an hour or two. If he doesn't urinate, will do I&O. Encourage him to stay out of bed. Thanks"  Patient has since voided 434ml in urinal

## 2022-06-19 NOTE — Progress Notes (Signed)
PROGRESS NOTE  Paul Sandoval. QIH:[id number] DOB: [birth year]   PCP: Coral Spikes, DO  Patient is from: Home.  DOA: 06/10/2022 LOS: 0  Chief complaints Chief Complaint  Patient presents with   Shortness of Breath     Brief Narrative / Interim history: 81 year old M with PMH of combined CHF/ICM, CAD/CABG, COPD, CKD-3A, right lung cancer, HTN, osteoarthritis and anemia returning with shortness of breath, orthopnea and BLE edema and admitted for acute respiratory failure with hypoxia due to acute on chronic combined CHF, possible LLL pneumonia and COPD exacerbation.  BNP elevated to 1000 (higher than baseline).  CXR suggests LLL opacity.  Tested positive for COVID-19 as well.  Patient was started on IV Lasix, Decadron, ceftriaxone, azithromycin and Paxlovid.   Of note, recently hospitalized for COPD and CHF exacerbation 10/7-10/12.  Subjective: Seen and examined earlier this morning.  Sitting on bedside chair.  Reports improvement in his breathing.  Reports cough with clear phlegm.  Denies chest pain, GI or UTI symptoms.  Reports making good amount of urine.  Objective: Vitals:   05/28/2022 2107 06/19/22 0029 06/19/22 0447 06/19/22 1106  BP:  (!) 116/53 (!) 114/50   Pulse:  96 79   Resp:  18 19   Temp:  98.4 F (36.9 C) 97.6 F (36.4 C)   TempSrc:  Oral Oral   SpO2: 98% 96% 98% 100%  Weight:      Height:        Examination:  GENERAL: No apparent distress.  Nontoxic. HEENT: MMM.  Vision and hearing grossly intact.  NECK: Supple.  Notable JVD sitting upright. RESP:  No IWOB.  Diminished aeration over RLL. CVS:  RRR. Heart sounds normal.  ABD/GI/GU: BS+. Abd soft, NTND.  MSK/EXT:  Moves extremities. No apparent deformity.  2+ BLE edema. SKIN: no apparent skin lesion or wound NEURO: Awake, alert and oriented appropriately.  No apparent focal neuro deficit. PSYCH: Calm. Normal affect.   Procedures:  None  Microbiology summarized: COVID-19 PCR positive.  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  Improving. -Treat treatable causes -Wean oxygen as able -Incentive spirometry, PT/OT  Acute on chronic combined CHF: Presents with SOB, orthopnea and BLE edema.  BNP evaded to 900s.  Notable JVD and 2+ BLE edema on exam.  TTE in 03/2022 with LVEF of 30 to 35%, GH, G1-DD and mildly reduced RVSF.  Patient reports compliance with Farxiga, Lasix, diet and salt intake.  Started on IV Lasix.  About 850 cc urine overnight.  Blood pressure soft. Cr slightly up. -Continue IV Lasix -Hold Entresto for now -Continue Coreg and Aldactone. -Strict intake and output, renal functions and electrolytes -TED hose and leg elevation  Acute exacerbation of chronic obstructive pulmonary disease: Could be in the setting of COVID-19 infection and  possible pneumonia. -Continue Decadron, Paxlovid, nebs and inhalers. -Continue CAP coverage -May consider changing Coreg to Toprol-XL for better beta-1 selectivity  COVID-19 infection: Fully vaccinated.  Elevated inflammatory markers.  Pro-Cal 0.19. -Continue Decadron, Paxlovid, nebs and inhalers -Continue CAP coverage as well -Monitor inflammatory markers -Continue isolation  precaution  History of CAD s/p CABG in 1996 and PCI to dLM and SVG-D in 02/2014: Mildly elevated troponin.  No chest pain. -Continue home meds-Coreg, Plavix and Crestor -Outpatient follow-up with cardiology  CKD-3B/hyponatremia: Stable. Recent Labs    02/18/22 1452 03/12/22 0837 06/02/22 1913 06/03/22 0521 06/04/22 0935 06/05/22 0355 06/06/22 0339 06/07/22 0405 06/09/2022 0633 06/19/22 0410  BUN 23 19 28* 26* 41* 46* 51* 44* 25* 29*  CREATININE 1.82* 1.75* 1.81* 1.71* 1.86* 1.72* 1.85* 1.73* 1.76* 1.83*  -Hold Entresto for now  Essential hypertension: Soft blood pressure. -Cardiac meds as above   GERD Continue PPI.  Number cytopenia/normocytic anemia Monitor platelet count.   Hyperlipidemia -Continue home Crestor  Physical deconditioning -PT/OT eval    Body mass index is 24.97 kg/m.           DVT prophylaxis:  Place TED hose Start: 06/19/22 1130 SCDs Start: 06/11/2022 1016  Code Status: Full code Family Communication: Updated patient's son at bedside Level of care: Telemetry Status is: Inpatient Remains inpatient appropriate because: Acute respiratory failure with hypoxia, CHF exacerbation, COPD exacerbation, COVID-19 infection and possible pneumonia   Final disposition: TBD Consultants:  None  Sch Meds:  Scheduled Meds:  carvedilol  18.75 mg Oral BID WC   clopidogrel  75 mg Oral Daily   dapagliflozin propanediol  10 mg Oral QAC breakfast   dexamethasone (DECADRON) injection  6 mg Intravenous Daily   furosemide  40 mg Intravenous Daily   ipratropium-albuterol  3 mL Nebulization QID   mometasone-formoterol  2 puff Inhalation BID   nirmatrelvir/ritonavir EUA (renal dosing)  2 tablet Oral BID   pantoprazole  40 mg Oral Daily   [START ON 06/24/2022] rosuvastatin  20 mg Oral QHS   spironolactone  25 mg Oral Daily   Continuous Infusions:  azithromycin 500 mg (06/19/22 0937)   cefTRIAXone (ROCEPHIN)  IV 1 g (06/19/22 1159)   PRN Meds:.acetaminophen  **OR** acetaminophen, albuterol, ondansetron **OR** ondansetron (ZOFRAN) IV, sodium chloride  Antimicrobials: Anti-infectives (From admission, onward)    Start     Dose/Rate Route Frequency Ordered Stop   06/19/22 1000  azithromycin (ZITHROMAX) 500 mg in sodium chloride 0.9 % 250 mL IVPB        500 mg 250 mL/hr over 60 Minutes Intravenous Every 24 hours 05/28/2022 1931 06/24/22 0959   06/19/22 1000  cefTRIAXone (ROCEPHIN) 1 g in sodium chloride 0.9 % 100 mL IVPB        1 g 200 mL/hr over 30 Minutes Intravenous Every 24 hours 06/19/2022 2120 06/23/22 0959   05/31/2022 2200  nirmatrelvir/ritonavir EUA (renal dosing) (PAXLOVID) 2 tablet       Note to Pharmacy: Please adjust or change treatment as needed.   2 tablet Oral 2 times daily 06/15/2022 1929 06/23/22 2159   06/06/2022 2030  cefTRIAXone (ROCEPHIN) 1 g in sodium chloride 0.9 % 100 mL IVPB  Status:  Discontinued        1 g 200 mL/hr over 30 Minutes Intravenous Every 24 hours 06/05/2022 1931 06/23/2022 2120   06/20/2022 1000  levofloxacin (LEVAQUIN) tablet 500 mg  Status:  Discontinued        500 mg Oral Daily 06/05/2022 0943 06/21/2022 1943  I have personally reviewed the following labs and images: CBC: Recent Labs  Lab 06/04/2022 0633 06/19/22 0410  WBC 7.7 5.7  NEUTROABS 7.1 5.4  HGB 9.7* 9.9*  HCT 31.1* 32.0*  MCV 86.9 86.0  PLT 71* 68*   BMP &GFR Recent Labs  Lab 06/04/2022 0633 06/19/22 0410  NA 131* 130*  K 3.5 3.8  CL 95* 92*  CO2 29 29  GLUCOSE 89 152*  BUN 25* 29*  CREATININE 1.76* 1.83*  CALCIUM 7.8* 7.8*  MG 1.9 2.1  PHOS  --  4.9*   Estimated Creatinine Clearance: 32.1 mL/min (A) (by C-G formula based on SCr of 1.83 mg/dL (H)). Liver & Pancreas: Recent Labs  Lab 06/19/22 0410  AST 23  ALT 19  ALKPHOS 65  BILITOT 0.6  PROT 6.2*  ALBUMIN 2.3*   No results for input(s): "LIPASE", "AMYLASE" in the last 168 hours. No results for input(s): "AMMONIA" in the last 168 hours. Diabetic: No results for input(s):  "HGBA1C" in the last 72 hours. Recent Labs  Lab 05/30/2022 2213 06/19/22 0758 06/19/22 1125  GLUCAP 101* 173* 153*   Cardiac Enzymes: No results for input(s): "CKTOTAL", "CKMB", "CKMBINDEX", "TROPONINI" in the last 168 hours. No results for input(s): "PROBNP" in the last 8760 hours. Coagulation Profile: No results for input(s): "INR", "PROTIME" in the last 168 hours. Thyroid Function Tests: No results for input(s): "TSH", "T4TOTAL", "FREET4", "T3FREE", "THYROIDAB" in the last 72 hours. Lipid Profile: No results for input(s): "CHOL", "HDL", "LDLCALC", "TRIG", "CHOLHDL", "LDLDIRECT" in the last 72 hours. Anemia Panel: Recent Labs    06/19/22 0410  FERRITIN 2,703*   Urine analysis:    Component Value Date/Time   COLORURINE YELLOW 06/03/2022 0307   APPEARANCEUR CLEAR 06/03/2022 0307   LABSPEC 1.013 06/03/2022 0307   LABSPEC 1.010 03/28/2016 1244   PHURINE 5.0 06/03/2022 0307   GLUCOSEU >=500 (A) 06/03/2022 0307   GLUCOSEU Negative 03/28/2016 1244   HGBUR NEGATIVE 06/03/2022 0307   BILIRUBINUR NEGATIVE 06/03/2022 0307   BILIRUBINUR Negative 03/28/2016 [place] 06/03/2022 0307   PROTEINUR 30 (A) 06/03/2022 0307   UROBILINOGEN 0.2 03/28/2016 1244   NITRITE NEGATIVE 06/03/2022 0307   LEUKOCYTESUR NEGATIVE 06/03/2022 0307   LEUKOCYTESUR Trace 03/28/2016 1244   Sepsis Labs: Invalid input(s): "PROCALCITONIN", "LACTICIDVEN"  Microbiology: Recent Results (from the past 240 hour(s))  SARS Coronavirus 2 by RT PCR (hospital order, performed in [facility] hospital lab) *cepheid single result test* Anterior Nasal Swab     Status: Abnormal   Collection Time: 06/16/2022  5:34 PM   Specimen: Anterior Nasal Swab  Result Value Ref Range Status   SARS Coronavirus 2 by RT PCR POSITIVE (A) NEGATIVE Final    Comment: (NOTE) SARS-CoV-2 target nucleic acids are DETECTED  SARS-CoV-2 RNA is generally detectable in upper respiratory specimens  during the acute phase of  infection.  Positive results are indicative  of the presence of the identified virus, but do not rule out bacterial infection or co-infection with other pathogens not detected by the test.  Clinical correlation with patient history and  other diagnostic information is necessary to determine patient infection status.  The expected result is negative.  Fact Sheet for Patients:   https://www.patel.info/   Fact Sheet for Healthcare Providers:   https://hall.com/    This test is not yet approved or cleared by the [place] FDA and  has been authorized for detection and/or diagnosis of SARS-CoV-2 by FDA under an Emergency Use Authorization (EUA).  This  EUA will remain in effect (meaning this test can be used) for the duration of  the COVID-19 declaration under Section 564(b)(1)  of the Act, 21 U.S.C. section 360-bbb-3(b)(1), unless the authorization is terminated or revoked sooner.   Performed at [facility], [place] [address] [place], [place] [address]     Radiology Studies: No results found.    Symiah Nowot[place]  If 7PM-7AM, please contact night-coverage [email or web address] 06/19/2022, 2:16 PM

## 2022-06-20 ENCOUNTER — Inpatient Hospital Stay (HOSPITAL_COMMUNITY): Payer: Medicare Other

## 2022-06-20 DIAGNOSIS — I5043 Acute on chronic combined systolic (congestive) and diastolic (congestive) heart failure: Secondary | ICD-10-CM | POA: Diagnosis not present

## 2022-06-20 DIAGNOSIS — J9601 Acute respiratory failure with hypoxia: Secondary | ICD-10-CM | POA: Diagnosis not present

## 2022-06-20 DIAGNOSIS — J441 Chronic obstructive pulmonary disease with (acute) exacerbation: Secondary | ICD-10-CM | POA: Diagnosis not present

## 2022-06-20 DIAGNOSIS — N1832 Chronic kidney disease, stage 3b: Secondary | ICD-10-CM | POA: Diagnosis not present

## 2022-06-20 LAB — CBC WITH DIFFERENTIAL/PLATELET
Abs Immature Granulocytes: 0.1 10*3/uL — ABNORMAL HIGH (ref 0.00–0.07)
Basophils Absolute: 0 10*3/uL (ref 0.0–0.1)
Basophils Relative: 0 %
Eosinophils Absolute: 0 10*3/uL (ref 0.0–0.5)
Eosinophils Relative: 0 %
HCT: 31.9 % — ABNORMAL LOW (ref 39.0–52.0)
Hemoglobin: 9.9 g/dL — ABNORMAL LOW (ref 13.0–17.0)
Immature Granulocytes: 2 %
Lymphocytes Relative: 3 %
Lymphs Abs: 0.2 10*3/uL — ABNORMAL LOW (ref 0.7–4.0)
MCH: 26.5 pg (ref 26.0–34.0)
MCHC: 31 g/dL (ref 30.0–36.0)
MCV: 85.5 fL (ref 80.0–100.0)
Monocytes Absolute: 0.1 10*3/uL (ref 0.1–1.0)
Monocytes Relative: 1 %
Neutro Abs: 4.6 10*3/uL (ref 1.7–7.7)
Neutrophils Relative %: 94 %
Platelets: 58 10*3/uL — ABNORMAL LOW (ref 150–400)
RBC: 3.73 MIL/uL — ABNORMAL LOW (ref 4.22–5.81)
RDW: 17.3 % — ABNORMAL HIGH (ref 11.5–15.5)
WBC: 4.9 10*3/uL (ref 4.0–10.5)
nRBC: 0 % (ref 0.0–0.2)

## 2022-06-20 LAB — IRON AND TIBC
Iron: 37 ug/dL — ABNORMAL LOW (ref 45–182)
Saturation Ratios: 30 % (ref 17.9–39.5)
TIBC: 125 ug/dL — ABNORMAL LOW (ref 250–450)
UIBC: 88 ug/dL

## 2022-06-20 LAB — COMPREHENSIVE METABOLIC PANEL
ALT: 18 U/L (ref 0–44)
AST: 19 U/L (ref 15–41)
Albumin: 2.2 g/dL — ABNORMAL LOW (ref 3.5–5.0)
Alkaline Phosphatase: 61 U/L (ref 38–126)
Anion gap: 11 (ref 5–15)
BUN: 35 mg/dL — ABNORMAL HIGH (ref 8–23)
CO2: 29 mmol/L (ref 22–32)
Calcium: 8 mg/dL — ABNORMAL LOW (ref 8.9–10.3)
Chloride: 91 mmol/L — ABNORMAL LOW (ref 98–111)
Creatinine, Ser: 1.79 mg/dL — ABNORMAL HIGH (ref 0.61–1.24)
GFR, Estimated: 37 mL/min — ABNORMAL LOW (ref >60)
Glucose, Bld: 141 mg/dL — ABNORMAL HIGH (ref 70–99)
Potassium: 3.8 mmol/L (ref 3.5–5.1)
Sodium: 131 mmol/L — ABNORMAL LOW (ref 135–145)
Total Bilirubin: 0.5 mg/dL (ref 0.3–1.2)
Total Protein: 6.1 g/dL — ABNORMAL LOW (ref 6.5–8.1)

## 2022-06-20 LAB — GLUCOSE, CAPILLARY
Glucose-Capillary: 123 mg/dL — ABNORMAL HIGH (ref 70–99)
Glucose-Capillary: 155 mg/dL — ABNORMAL HIGH (ref 70–99)
Glucose-Capillary: 216 mg/dL — ABNORMAL HIGH (ref 70–99)
Glucose-Capillary: 202 mg/dL — ABNORMAL HIGH (ref 70–99)

## 2022-06-20 LAB — VITAMIN B12: Vitamin B-12: 667 pg/mL (ref 180–914)

## 2022-06-20 LAB — FOLATE: Folate: 8.3 ng/mL (ref >5.9)

## 2022-06-20 LAB — RETICULOCYTES
Immature Retic Fract: 0.9 % — ABNORMAL LOW (ref 2.3–15.9)
RBC.: 3.68 MIL/uL — ABNORMAL LOW (ref 4.22–5.81)
Retic Count, Absolute: 16.7 10*3/uL — ABNORMAL LOW (ref 19.0–186.0)
Retic Ct Pct: 0.5 % (ref 0.4–3.1)

## 2022-06-20 LAB — EXPECTORATED SPUTUM ASSESSMENT W GRAM STAIN, RFLX TO RESP C

## 2022-06-20 LAB — BRAIN NATRIURETIC PEPTIDE: B Natriuretic Peptide: 359.4 pg/mL — ABNORMAL HIGH (ref 0.0–100.0)

## 2022-06-20 LAB — C-REACTIVE PROTEIN: CRP: 8.5 mg/dL — ABNORMAL HIGH (ref <1.0)

## 2022-06-20 LAB — FERRITIN: Ferritin: 2541 ng/mL — ABNORMAL HIGH (ref 24–336)

## 2022-06-20 LAB — MAGNESIUM: Magnesium: 2.2 mg/dL (ref 1.7–2.4)

## 2022-06-20 LAB — D-DIMER, QUANTITATIVE: D-Dimer, Quant: 2.64 ug/mL-FEU — ABNORMAL HIGH (ref 0.00–0.50)

## 2022-06-20 LAB — CK: Total CK: 20 U/L — ABNORMAL LOW (ref 49–397)

## 2022-06-20 LAB — PHOSPHORUS: Phosphorus: 4.2 mg/dL (ref 2.5–4.6)

## 2022-06-20 MED ORDER — LORAZEPAM 2 MG/ML IJ SOLN
INTRAMUSCULAR | Status: AC
  Administered 2022-06-20: 0.5 mg via INTRAVENOUS
  Filled 2022-06-20: qty 1

## 2022-06-20 MED ORDER — METOPROLOL SUCCINATE ER 25 MG PO TB24
50.0000 mg | ORAL_TABLET | Freq: Every day | ORAL | Status: DC
  Administered 2022-06-20 – 2022-06-22 (×3): 50 mg via ORAL
  Filled 2022-06-20 (×2): qty 1
  Filled 2022-06-20: qty 2

## 2022-06-20 MED ORDER — MENTHOL 3 MG MT LOZG
1.0000 | LOZENGE | OROMUCOSAL | Status: DC | PRN
  Administered 2022-06-20 – 2022-07-01 (×3): 3 mg via ORAL
  Filled 2022-06-20 (×3): qty 9

## 2022-06-20 MED ORDER — LORAZEPAM 2 MG/ML IJ SOLN: 0.5000 mg | Freq: Once | INTRAMUSCULAR | Status: AC

## 2022-06-20 MED ORDER — IPRATROPIUM-ALBUTEROL 0.5-2.5 (3) MG/3ML IN SOLN
3.0000 mL | Freq: Three times a day (TID) | RESPIRATORY_TRACT | Status: DC
  Administered 2022-06-20 – 2022-06-22 (×7): 3 mL via RESPIRATORY_TRACT
  Filled 2022-06-20 (×7): qty 3

## 2022-06-20 MED ORDER — FUROSEMIDE 10 MG/ML IJ SOLN
20.0000 mg | Freq: Once | INTRAMUSCULAR | Status: AC
  Administered 2022-06-20: 20 mg via INTRAVENOUS
  Filled 2022-06-20: qty 2

## 2022-06-20 NOTE — Progress Notes (Addendum)
       CROSS COVER NOTE  NAME: Jolee Ewing. MRN: [id number] DOB : 1941/07/30    Date of Service   06/20/2022   HPI/Events of Note   Notified by bedside RN of patient in respiratory distress.  Mr. Mcquerry was admitted for acute respiratory failure with hypoxia due to acute on chronic combined CHF, pneumonia, and COPD exacerbation. Patient is already on  IV Lasix, Decadron, ceftriaxone, azithromycin.  Paxlovid changed to [place] due to concern for interaction with Plavix.   Nursing staff found the patient seated in tripod position working hard to breathe and wheezy. Vital signs showed oxygen saturation 70-80's on 4 L oxygen, bp 133/65, HR 102.   During bedside assessment, patient answers are very short due to his increased work of breathing. He is A/O x4 and appears anxious. He was on 15 L nonrebreather with sats maintaining > 92% receiving a neb tx.  Chest x-ray: Compared to previous chest imaging, patient now has bilateral pleural effusions.  Breath sounds after breathing tx are now mostly diminished with some fine crackles at bases.  Peripheral edema present.  An additional 20 mg Lasix will also be administered tonight to help alleviate overload.  0045-bedside RN reports patient is mildly confused and has been removing oxygen mask.  O2 sats in low 80s.  Respiratory therapist has been asked to obtain ABG.  Patient is being changed now to high flow nasal cannula.  0200-ABG shows respiratory acidosis: pH 7.31, PCO2 71, P O2 64.  0300-revisited patient at bedside.  Compared to previous visit, work of breathing has improved.  Mentation also appears to be improving.  Patient is now alert and oriented x4.  Will obtain ABG this morning to reassess.   0940: ABG pending, day team to follow up.    Interventions/ Plan   Albuterol neb Lasix 20 mg IV Ativan 0.5 mg IV ABG-respiratory acidosis       Raenette Rover, DNP, [place]

## 2022-06-20 NOTE — Progress Notes (Signed)
Pt requested to have neb tx changed to TID. Pt stated he does not want a tx at 12:00.

## 2022-06-20 NOTE — Significant Event (Signed)
Rapid Response Event Note   Reason for Call :  Respiratory distress in COVID patient.  Initial Focused Assessment:  Patient seated in tripod position in chair. Patient unable to verbalize words due to respiratory distress. Patient holds up three fingers to indicate this is the third event like this today. Patient recently stood to urinate. Patient is alert/oriented, following commands, but refusing to get into bed. Patient is restless and saturations in 70's on portable monitor and telemetry. Lung sounds with wheezes and diminished throughout. Significant peripheral edema.      Interventions:  Focused assessment. Review of history completed. Respiratory provided nebulizer treatment and inhaler. Provider ordered chest xray, Lasix, and Ativan. Patient was assisted to bed from chair and educated about need to reduce exertion and promote safety. Bedside RN administered medications and placed external urinary device. CXR does not show new information.  Plan of Care:  Patient improved AEB decreased restlessness, ability to speak few words/sentences, and improved oxygen saturation. Continue with NRB and salter oxygen devices. Utilize external urinary devices. Maintain bedrest overnight. Continue continuous oxygen saturation monitoring and telemetry. Call rapid and provider if worsening or no improvement. Patient dozing off to sleep at end of visit.   Event Summary:   MD Notified: Raenette Rover, NP  Call Time: 2015 Arrival Time: 2020 End Time: 2115  Selinda Michaels, RN

## 2022-06-20 NOTE — Progress Notes (Signed)
RT went over flutter valve with patient.

## 2022-06-20 NOTE — Evaluation (Signed)
Physical Therapy Evaluation Patient Details [name]. MRN: [id number] DOB: 08/11/1941 Today's Date: 06/20/2022  History of Present Illness  81 year old M admitted 06/02/2022 with shortness of breath, orthopnea and BLE edema and admitted for acute respiratory failure with hypoxia due to acute on chronic combined CHF, possible LLL pneumonia and COPD exacerbation. CXR suggests LLL opacity.  + COVID-19.Of note, recently hospitalized for COPD and CHF exacerbation 10/7-10/12. PMH includes combined CHF/ICM, CAD/CABG, COPD, CKD-3A, right lung cancer, HTN, osteoarthritis and anemia.    Clinical Impression  Keyontay Stolz. is 81 y.o. male admitted with above HPI and diagnosis. Patient is currently limited by functional impairments below (see PT problem list). Patient lives with his wife and their son checks in daily, pt is independent with no AD at baseline. He currently requires supervision for mobility and assist for line management. Pt ambulated short bout in room and completed standing LE exercises; on 6L/min and SpO2 maintained at 91% or greater throughout. Patient will benefit from continued skilled PT interventions to address impairments and progress independence with mobility, recommending he continue to work with PT in acute setting, anticipate not follow up needed when medically ready to discharge home. Acute PT will follow and progress as able.        Recommendations for follow up therapy are one component of a multi-disciplinary discharge planning process, led by the attending physician.  Recommendations may be updated based on patient status, additional functional criteria and insurance authorization.  Follow Up Recommendations No PT follow up      Assistance Recommended at Discharge None  Patient can return home with the following  A little help with walking and/or transfers;A little help with bathing/dressing/bathroom;Assistance with cooking/housework;Assist for  transportation;Help with stairs or ramp for entrance    Equipment Recommendations None recommended by PT  Recommendations for Other Services       Functional Status Assessment Patient has had a recent decline in their functional status and demonstrates the ability to make significant improvements in function in a reasonable and predictable amount of time.     Precautions / Restrictions Precautions Precaution Comments: monitor O2, COVID Restrictions Weight Bearing Restrictions: No      Mobility  Bed Mobility               General bed mobility comments: OOB in recliner at beginning and end of session    Transfers Overall transfer level: Needs assistance Equipment used: None (pushes IV pole) Transfers: Sit to/from Stand Sit to Stand: Supervision                Ambulation/Gait Ambulation/Gait assistance: Supervision Gait Distance (Feet): 40 Feet Assistive device: None Gait Pattern/deviations: Step-through pattern, Decreased stride length, Trunk flexed, Shuffle Gait velocity: decr     General Gait Details: overall steady, bil LE's slightly flexed in standing. no overt LOB throughout. SpO2 91% or better on 6L/min with gait in room.  Stairs            Wheelchair Mobility    Modified Rankin (Stroke Patients Only)       Balance Overall balance assessment: Needs assistance Sitting-balance support: No upper extremity supported, Feet supported Sitting balance-Leahy Scale: Good     Standing balance support: No upper extremity supported, During functional activity Standing balance-Leahy Scale: Fair                               Pertinent Vitals/Pain Pain Assessment  Pain Assessment: No/denies pain    Home Living Family/patient expects to be discharged to:: Private residence Living Arrangements: Spouse/significant other Available Help at Discharge: Family;Available 24 hours/day (son) Type of Home: House Home Access: Stairs to  enter Entrance Stairs-Rails: Psychiatric nurse of Steps: 1   Home Layout: One level Home Equipment: Shower seat      Prior Function Prior Level of Function : Independent/Modified Independent             Mobility Comments: prior to last 2-3 weeks pt was very independent and going out to eat with wife regularly and working around the home. ADLs Comments: Pt's wife and son have been helping with ADL since last hospitalization, pt's wife very healthy per pt report.     Hand Dominance   Dominant Hand: Right    Extremity/Trunk Assessment   Upper Extremity Assessment Upper Extremity Assessment: Defer to OT evaluation;Overall [facility] for tasks assessed    Lower Extremity Assessment Lower Extremity Assessment: Overall WFL for tasks assessed    Cervical / Trunk Assessment Cervical / Trunk Assessment: Normal  Communication   Communication: Other (comment) (breathless and sore throat)  Cognition Arousal/Alertness: Awake/alert Behavior During Therapy: WFL for tasks assessed/performed Overall Cognitive Status: Within Functional Limits for tasks assessed                                 General Comments: son present at the end of the session        General Comments General comments (skin integrity, edema, etc.): dropped to 85% on 6L O2, DOE 3/4 with activity.    Exercises General Exercises - Lower Extremity Gluteal Sets: AROM, Both, 20 reps, Limitations, Standing Gluteal Sets Limitations: hip extension Hip ABduction/ADduction: AROM, Both, 20 reps, Standing Hip Flexion/Marching: AROM, Both, 20 reps, Standing (knee flexion) Heel Raises: AROM, Both, 10 reps, Standing   Assessment/Plan    PT Assessment Patient needs continued PT services  PT Problem List Decreased activity tolerance;Decreased balance;Decreased mobility;Decreased knowledge of use of DME;Decreased safety awareness;Decreased knowledge of precautions;Cardiopulmonary status limiting  activity       PT Treatment Interventions DME instruction;Gait training;Stair training;Functional mobility training;Therapeutic activities;Therapeutic exercise;Balance training;Patient/family education    PT Goals (Current goals can be found in the Care Plan section)  Acute Rehab PT Goals Patient Stated Goal: get better and home PT Goal Formulation: With patient Time For Goal Achievement: 07/04/22 Potential to Achieve Goals: Good    Frequency Min 3X/week     Co-evaluation               AM-PAC PT "6 Clicks" Mobility  Outcome Measure Help needed turning from your back to your side while in a flat bed without using bedrails?: None Help needed moving from lying on your back to sitting on the side of a flat bed without using bedrails?: None Help needed moving to and from a bed to a chair (including a wheelchair)?: A Little Help needed standing up from a chair using your arms (e.g., wheelchair or bedside chair)?: A Little Help needed to walk in hospital room?: A Little Help needed climbing 3-5 steps with a railing? : A Little 6 Click Score: 20    End of Session Equipment Utilized During Treatment: Gait belt;Oxygen Activity Tolerance: Patient tolerated treatment well Patient left: in chair;with family/visitor present;with call bell/phone within reach Nurse Communication: Mobility status PT Visit Diagnosis: Muscle weakness (generalized) (M62.81);Other abnormalities of gait and mobility (R26.89);Difficulty  in walking, not elsewhere classified (R26.2)    Time: 0459-1368 PT Time Calculation (min) (ACUTE ONLY): 29 min   Charges:   PT Evaluation $PT Eval Moderate Complexity: 1 Mod PT Treatments $Therapeutic Exercise: 8-22 mins        Verner Mould, DPT Acute Rehabilitation Services Office [phone]  06/20/22 12:57 PM

## 2022-06-20 NOTE — Evaluation (Signed)
Occupational Therapy Evaluation Patient Details [name]. MRN: [id number] DOB: 05/23/1941 Today's Date: 06/20/2022   History of Present Illness 81 year old M admitted 06/19/2022 with shortness of breath, orthopnea and BLE edema and admitted for acute respiratory failure with hypoxia due to acute on chronic combined CHF, possible LLL pneumonia and COPD exacerbation. CXR suggests LLL opacity.  + COVID-19.Of note, recently hospitalized for COPD and CHF exacerbation 10/7-10/12. PMH includes combined CHF/ICM, CAD/CABG, COPD, CKD-3A, right lung cancer, HTN, osteoarthritis and anemia.   Clinical Impression   Pt is typically independent in ADL and mobility. Pt has been needing assist at home from wife and son for ADL since recent hospitalization, using shower chair. Today Pt is able to demonstrate toilet transfer at min guard, peri care and LB clothing management at min guard. Standing grooming at min guard. Pt DOE 3/4 with standing functional tasks and SpO2 dropping to 84% on 6L. Educated on pursed lip breathing and importance of breathing through nose (Pt is a mouth breather and this goes against his typical habits) Pt is overall functioning at min guard level due to line management and has good support at home. OT will follow acutely for energy conservation education and continued functional activity with SpO2 monitoring. Do not plan for need of post-acute OT.       Recommendations for follow up therapy are one component of a multi-disciplinary discharge planning process, led by the attending physician.  Recommendations may be updated based on patient status, additional functional criteria and insurance authorization.   Follow Up Recommendations  No OT follow up    Assistance Recommended at Discharge PRN  Patient can return home with the following Assistance with cooking/housework;A little help with bathing/dressing/bathroom;Help with stairs or ramp for entrance    Functional Status  Assessment  Patient has had a recent decline in their functional status and demonstrates the ability to make significant improvements in function in a reasonable and predictable amount of time.  Equipment Recommendations  BSC/3in1    Recommendations for Other Services       Precautions / Restrictions Precautions Precaution Comments: monitor O2, COVID Restrictions Weight Bearing Restrictions: No      Mobility Bed Mobility               General bed mobility comments: OOB in recliner at beginning and end of session    Transfers Overall transfer level: Needs assistance Equipment used: None (pushes IV pole) Transfers: Sit to/from Stand Sit to Stand: Supervision                  Balance Overall balance assessment: Needs assistance Sitting-balance support: No upper extremity supported, Feet supported Sitting balance-Leahy Scale: Normal     Standing balance support: Single extremity supported Standing balance-Leahy Scale: Fair Standing balance comment: actively reaches for environmental support                           ADL either performed or assessed with clinical judgement   ADL Overall ADL's : Needs assistance/impaired Eating/Feeding: Independent   Grooming: Supervision/safety;Wash/dry hands;Wash/dry face;Standing Grooming Details (indicate cue type and reason): sink level, DOE 3/4 and SpO2 dropped to 84% on 6L Upper Body Bathing: Moderate assistance   Lower Body Bathing: Moderate assistance;Sitting/lateral leans   Upper Body Dressing : Minimal assistance;Sitting   Lower Body Dressing: Minimal assistance;Sit to/from stand   Toilet Transfer: Min guard;Ambulation Toilet Transfer Details (indicate cue type and reason): pushes own IV pole,  without IV pole does reach for external environmental support Toileting- Water quality scientist and Hygiene: Min guard;Sit to/from stand Toileting - Clothing Manipulation Details (indicate cue type and reason):  managing peri care, underwear and pants Tub/ Shower Transfer: Minimal assistance   Functional mobility during ADLs: Min guard (pushing IV pole) General ADL Comments: Pt encouraged to breathe through nose to maximize supplemental O2 as his SpO2 drops to mid 80's on 6L. DOE 3/4 and actively reaching for environmental support for balance     Vision Patient Visual Report: No change from baseline Vision Assessment?: No apparent visual deficits     Perception     Praxis      Pertinent Vitals/Pain Pain Assessment Pain Assessment: No/denies pain     Hand Dominance Right   Extremity/Trunk Assessment Upper Extremity Assessment Upper Extremity Assessment: Overall WFL for tasks assessed   Lower Extremity Assessment Lower Extremity Assessment: Defer to PT evaluation   Cervical / Trunk Assessment Cervical / Trunk Assessment: Normal   Communication Communication Communication: Other (comment) (breathless and sore throat)   Cognition Arousal/Alertness: Awake/alert Behavior During Therapy: WFL for tasks assessed/performed Overall Cognitive Status: Within Functional Limits for tasks assessed                                 General Comments: son present at the end of the session     General Comments  dropped to 85% on 6L O2, DOE 3/4 with activity.    Exercises     Shoulder Instructions      Home Living Family/patient expects to be discharged to:: Private residence Living Arrangements: Spouse/significant other Available Help at Discharge: Family;Available 24 hours/day (son) Type of Home: House Home Access: Stairs to enter [employer] of Steps: 1 Entrance Stairs-Rails: Right;Left Home Layout: One level     Bathroom Shower/Tub: Teacher, early years/pre: Handicapped height     Home Equipment: Shower seat          Prior Functioning/Environment Prior Level of Function : Independent/Modified Independent               ADLs  Comments: Pt's wife and son have been helping with ADL since hospitalization        OT Problem List: Cardiopulmonary status limiting activity;Decreased activity tolerance      OT Treatment/Interventions: Self-care/ADL training;Energy conservation;Patient/family education;Balance training    OT Goals(Current goals can be found in the care plan section) Acute Rehab OT Goals Patient Stated Goal: breathe easy again OT Goal Formulation: With patient/family Time For Goal Achievement: 07/04/22 Potential to Achieve Goals: Good  OT Frequency: Min 2X/week    Co-evaluation              AM-PAC OT "6 Clicks" Daily Activity     Outcome Measure Help from another person eating meals?: None Help from another person taking care of personal grooming?: None Help from another person toileting, which includes using toliet, bedpan, or urinal?: None Help from another person bathing (including washing, rinsing, drying)?: A Little Help from another person to put on and taking off regular upper body clothing?: A Little Help from another person to put on and taking off regular lower body clothing?: A Lot 6 Click Score: 20   End of Session Equipment Utilized During Treatment: Oxygen (6L via HFNC) Nurse Communication: Mobility status (BM, SpO2 status)  Activity Tolerance: Patient tolerated treatment well Patient left: in chair;with call bell/phone within reach;with family/visitor  present  OT Visit Diagnosis: Unsteadiness on feet (R26.81)                Time: 4753-3917 OT Time Calculation (min): 26 min Charges:  OT General Charges $OT Visit: 1 Visit OT Evaluation $OT Eval Low Complexity: 1 Low OT Treatments $Self Care/Home Management : 8-22 mins  Jesse Sans OTR/L Acute Rehabilitation Services Office: [place] 06/20/2022, 11:15 AM

## 2022-06-20 NOTE — Progress Notes (Signed)
PROGRESS NOTE  Paul Sandoval. KVQ:[id number] DOB: Jun 02, 1941   PCP: Coral Spikes, DO  Patient is from: Home.  DOA: 06/05/2022 LOS: 1  Chief complaints Chief Complaint  Patient presents with   Shortness of Breath     Brief Narrative / Interim history: 81 year old M with PMH of combined CHF/ICM, CAD/CABG, COPD, CKD-3A, right lung cancer, HTN, osteoarthritis and anemia returning with shortness of breath, orthopnea and BLE edema and admitted for acute respiratory failure with hypoxia due to acute on chronic combined CHF, possible LLL pneumonia and COPD exacerbation.  BNP elevated to 1000 (higher than baseline).  CXR suggests LLL opacity.  Tested positive for COVID-19 as well.  Patient was started on IV Lasix, Decadron, ceftriaxone, azithromycin and Paxlovid.   Of note, recently hospitalized for COPD and CHF exacerbation 10/7-10/12.  Patient was continued on IV Lasix, Decadron, ceftriaxone, azithromycin.  Paxlovid changed to Lagervio due to concern for interaction with Plavix.  Still requiring 6 L but slowly improving  Subjective: Seen and examined earlier this morning.  No major events overnight of this morning.  He says he feels better from breathing standpoint although he continues to require 6 L.  At least he does not have work of breathing.  Reports making good amount of urine.  Denies feeling dizzy or lightheaded.  Denies chest pain.  He reports sore throat.  His voice is hoarse.  He reports chronic urinary hesitancy but denies diagnosis of BPH.   Objective: Vitals:   06/20/22 0827 06/20/22 0829 06/20/22 1112 06/20/22 1351  BP:   (!) 95/48 (!) 137/59  Pulse:   86 (!) 102  Resp:   19   Temp:   97.8 F (36.6 C)   TempSrc:   Oral   SpO2: 94% 94% 99%   Weight:      Height:        Examination:  GENERAL: No apparent distress.  Nontoxic. HEENT: MMM.  Vision and hearing grossly intact.  Hoarse voice. NECK: Supple.  No apparent JVD.  RESP:  No IWOB.  Diminished aeration in  right lung. CVS:  RRR. Heart sounds normal.  ABD/GI/GU: BS+. Abd soft, NTND.  MSK/EXT:  Moves extremities. No apparent deformity. No edema.  SKIN: no apparent skin lesion or wound NEURO: Awake and alert. Oriented appropriately.  No apparent focal neuro deficit. PSYCH: Calm. Normal affect.   Procedures:  None  Microbiology summarized: COVID-19 PCR positive.  Assessment and plan: Principal Problem:   Acute on chronic combined systolic and diastolic congestive heart failure (HCC) Active Problems:   HLD (hyperlipidemia)   Essential hypertension   GERD (gastroesophageal reflux disease)   CAD (coronary artery disease)   Thrombocytopenia (HCC)   Stage 3b chronic kidney disease (CKD) ([place])   Primary cancer of right upper lobe of lung (HCC)   Cardiomyopathy, ischemic   History of endovascular stent graft for abdominal aortic aneurysm (AAA)   Acute exacerbation of chronic obstructive pulmonary disease (COPD) (HCC)   Acute respiratory failure with hypoxia (HCC)   Anemia of chronic kidney failure   COVID-19 virus infection  Acute respiratory failure with hypoxia: Multifactorial including acute CHF, possible COPD exacerbation, COVID-19 infection and LLL pneumonia.  Improving. -Treat treatable causes -Wean oxygen as able.  Minimum oxygen to keep saturation above 88%. -Incentive spirometry, PT/OT  Acute on chronic combined CHF: Presents with SOB, orthopnea and BLE edema.  BNP evaded to 900s.  JVD, BNP and edema improving.  TTE in 03/2022 with LVEF of 30 to 35%,  GH, G1-DD and mildly reduced RVSF.  Patient reports compliance with Farxiga, Lasix, diet and salt intake.  Net -1.6 L on IV Lasix.  Creatinine stable.  Soft blood pressure not symptomatic. -Continue IV Lasix 40 mg daily -Continue holding Entresto setting of hypotension and renal failure -Change Coreg to Toprol-XL.  Beta beta-1 selectivity and less effect on BP. -Continue Aldactone. -Strict intake and output, renal functions and  electrolytes -Continue TED hose and leg elevation  Acute exacerbation of chronic obstructive pulmonary disease: Could be in the setting of COVID-19 infection and possible pneumonia. -Continue Decadron, Lagervio nebs and inhalers. -Continue CAP coverage -May consider changing Coreg to Toprol-XL for better beta-1 selectivity  COVID-19 infection: Fully vaccinated.  Elevated inflammatory markers.  Pro-Cal 0.19. -Treatment as above. -Monitor inflammatory markers-improving -Continue isolation precaution  History of CAD s/p CABG in 1996 and PCI to dLM and SVG-D in 02/2014: Mildly elevated troponin.  No chest pain. -Continue home meds-Coreg, Plavix and Crestor -Outpatient follow-up with cardiology  CKD-3B/hyponatremia: Stable. Recent Labs    03/12/22 0837 06/02/22 1913 06/03/22 0521 06/04/22 0935 06/05/22 0355 06/06/22 0339 06/07/22 0405 06/09/2022 0633 06/19/22 0410 06/20/22 0408  BUN 19 28* 26* 41* 46* 51* 44* 25* 29* 35*  CREATININE 1.75* 1.81* 1.71* 1.86* 1.72* 1.85* 1.73* 1.76* 1.83* 1.79*  -Hold Entresto for now  Essential hypertension: soft BP this morning.  Now symptomatic. -Cardiac meds as above  GERD Continue PPI.  Number cytopenia/normocytic anemia: Stable. -Monitor    Hyperlipidemia -Continue home Crestor  Physical deconditioning -PT/OT eval    Body mass index is 24.97 kg/m.           DVT prophylaxis:  Place TED hose Start: 06/19/22 1130 SCDs Start: 06/13/2022 1016  Code Status: Full code Family Communication: Updated patient's son at bedside Level of care: Telemetry Status is: Inpatient Remains inpatient appropriate because: Acute respiratory failure with hypoxia, CHF exacerbation, COPD exacerbation, COVID-19 infection and possible pneumonia   Final disposition: TBD Consultants:  None  Sch Meds:  Scheduled Meds:  clopidogrel  75 mg Oral Daily   dapagliflozin propanediol  10 mg Oral QAC breakfast   dexamethasone (DECADRON) injection  6 mg  Intravenous Daily   furosemide  40 mg Intravenous Daily   ipratropium-albuterol  3 mL Nebulization TID   metoprolol succinate  50 mg Oral Daily   molnupiravir EUA  4 capsule Oral BID   mometasone-formoterol  2 puff Inhalation BID   pantoprazole  40 mg Oral Daily   [START ON 06/24/2022] rosuvastatin  20 mg Oral QHS   spironolactone  25 mg Oral Daily   Continuous Infusions:  azithromycin Stopped (06/20/22 1038)   cefTRIAXone (ROCEPHIN)  IV Stopped (06/20/22 1007)   PRN Meds:.acetaminophen **OR** acetaminophen, albuterol, menthol-cetylpyridinium, ondansetron **OR** ondansetron (ZOFRAN) IV, sodium chloride  Antimicrobials: Anti-infectives (From admission, onward)    Start     Dose/Rate Route Frequency Ordered Stop   06/19/22 2200  molnupiravir EUA (LAGEVRIO) capsule 800 mg        4 capsule Oral 2 times daily 06/19/22 1514 06/24/22 2159   06/19/22 1000  azithromycin (ZITHROMAX) 500 mg in sodium chloride 0.9 % 250 mL IVPB        500 mg 250 mL/hr over 60 Minutes Intravenous Every 24 hours 06/07/2022 1931 06/24/22 0959   06/19/22 1000  cefTRIAXone (ROCEPHIN) 1 g in sodium chloride 0.9 % 100 mL IVPB        1 g 200 mL/hr over 30 Minutes Intravenous Every 24 hours 06/17/2022 2120 06/23/22  0959   06/17/2022 2200  nirmatrelvir/ritonavir EUA (renal dosing) (PAXLOVID) 2 tablet  Status:  Discontinued       Note to Pharmacy: Please adjust or change treatment as needed.   2 tablet Oral 2 times daily 06/21/2022 1929 06/19/22 1514   06/17/2022 2030  cefTRIAXone (ROCEPHIN) 1 g in sodium chloride 0.9 % 100 mL IVPB  Status:  Discontinued        1 g 200 mL/hr over 30 Minutes Intravenous Every 24 hours 06/19/2022 1931 06/13/2022 2120   06/03/2022 1000  levofloxacin (LEVAQUIN) tablet 500 mg  Status:  Discontinued        500 mg Oral Daily 06/24/2022 0943 06/17/2022 1943        I have personally reviewed the following labs and images: CBC: Recent Labs  Lab 06/07/2022 0633 06/19/22 0410 06/20/22 0408  WBC 7.7 5.7 4.9   NEUTROABS 7.1 5.4 4.6  HGB 9.7* 9.9* 9.9*  HCT 31.1* 32.0* 31.9*  MCV 86.9 86.0 85.5  PLT 71* 68* 58*   BMP &GFR Recent Labs  Lab 06/10/2022 0633 06/19/22 0410 06/20/22 0408  NA 131* 130* 131*  K 3.5 3.8 3.8  CL 95* 92* 91*  CO2 29 29 29   GLUCOSE 89 152* 141*  BUN 25* 29* 35*  CREATININE 1.76* 1.83* 1.79*  CALCIUM 7.8* 7.8* 8.0*  MG 1.9 2.1 2.2  PHOS  --  4.9* 4.2   Estimated Creatinine Clearance: 32.9 mL/min (A) (by C-G formula based on SCr of 1.79 mg/dL (H)). Liver & Pancreas: Recent Labs  Lab 06/19/22 0410 06/20/22 0408  AST 23 19  ALT 19 18  ALKPHOS 65 61  BILITOT 0.6 0.5  PROT 6.2* 6.1*  ALBUMIN 2.3* 2.2*   No results for input(s): "LIPASE", "AMYLASE" in the last 168 hours. No results for input(s): "AMMONIA" in the last 168 hours. Diabetic: No results for input(s): "HGBA1C" in the last 72 hours. Recent Labs  Lab 06/19/22 1125 06/19/22 1704 06/19/22 2029 06/20/22 0741 06/20/22 1104  GLUCAP 153* 149* 170* 123* 155*   Cardiac Enzymes: Recent Labs  Lab 06/19/22 2210 06/20/22 0408  CKTOTAL 21* 20*   No results for input(s): "PROBNP" in the last 8760 hours. Coagulation Profile: No results for input(s): "INR", "PROTIME" in the last 168 hours. Thyroid Function Tests: No results for input(s): "TSH", "T4TOTAL", "FREET4", "T3FREE", "THYROIDAB" in the last 72 hours. Lipid Profile: No results for input(s): "CHOL", "HDL", "LDLCALC", "TRIG", "CHOLHDL", "LDLDIRECT" in the last 72 hours. Anemia Panel: Recent Labs    06/19/22 0410 06/20/22 0408  VITAMINB12  --  667  FOLATE  --  8.3  FERRITIN 2,703* 2,541*  TIBC  --  125*  IRON  --  37*  RETICCTPCT  --  0.5   Urine analysis:    Component Value Date/Time   COLORURINE YELLOW 06/03/2022 0307   APPEARANCEUR CLEAR 06/03/2022 0307   LABSPEC 1.013 06/03/2022 0307   LABSPEC 1.010 03/28/2016 1244   PHURINE 5.0 06/03/2022 0307   GLUCOSEU >=500 (A) 06/03/2022 0307   GLUCOSEU Negative 03/28/2016 1244   HGBUR  NEGATIVE 06/03/2022 0307   BILIRUBINUR NEGATIVE 06/03/2022 0307   BILIRUBINUR Negative 03/28/2016 [place] 06/03/2022 0307   PROTEINUR 30 (A) 06/03/2022 0307   UROBILINOGEN 0.2 03/28/2016 1244   NITRITE NEGATIVE 06/03/2022 0307   LEUKOCYTESUR NEGATIVE 06/03/2022 0307   LEUKOCYTESUR Trace 03/28/2016 1244   Sepsis Labs: Invalid input(s): "PROCALCITONIN", "LACTICIDVEN"  Microbiology: Recent Results (from the past 240 hour(s))  SARS Coronavirus 2 by  RT PCR (hospital order, performed in [facility] hospital lab) *cepheid single result test* Anterior Nasal Swab     Status: Abnormal   Collection Time: 06/15/2022  5:34 PM   Specimen: Anterior Nasal Swab  Result Value Ref Range Status   SARS Coronavirus 2 by RT PCR POSITIVE (A) NEGATIVE Final    Comment: (NOTE) SARS-CoV-2 target nucleic acids are DETECTED  SARS-CoV-2 RNA is generally detectable in upper respiratory specimens  during the acute phase of infection.  Positive results are indicative  of the presence of the identified virus, but do not rule out bacterial infection or co-infection with other pathogens not detected by the test.  Clinical correlation with patient history and  other diagnostic information is necessary to determine patient infection status.  The expected result is negative.  Fact Sheet for Patients:   https://www.patel.info/   Fact Sheet for Healthcare Providers:   https://hall.com/    This test is not yet approved or cleared by the [place] FDA and  has been authorized for detection and/or diagnosis of SARS-CoV-2 by FDA under an Emergency Use Authorization (EUA).  This EUA will remain in effect (meaning this test can be used) for the duration of  the COVID-19 declaration under Section 564(b)(1)  of the Act, 21 U.S.C. section 360-bbb-3(b)(1), unless the authorization is terminated or revoked sooner.   Performed at [facility],  [place] [address] [place], [place] [address]   Expectorated Sputum Assessment w Gram Stain, Rflx to Resp Cult     Status: None   Collection Time: 06/19/22  3:34 PM   Specimen: Expectorated Sputum  Result Value Ref Range Status   Specimen Description EXPECTORATED SPUTUM  Final   Special Requests NONE  Final   Sputum evaluation   Final    Sputum specimen not acceptable for testing.  Please recollect.   NOTIFIED SEZER,N RN AT 2103 ON 06/19/22 BY VAZQUEZJ Performed at [facility], [place] [address] [place], [place] [address]    Report Status 06/20/2022 FINAL  Final  Expectorated Sputum Assessment w Gram Stain, Rflx to Resp Cult     Status: None   Collection Time: 06/20/22 12:11 PM  Result Value Ref Range Status   Specimen Description EXPECTORATED SPUTUM  Final   Special Requests NONE  Final   Sputum evaluation   Final    Sputum specimen not acceptable for testing.  Please recollect.   Vernell Barrier, J RN @1412  ON 06/20/2022 BY Gloriann Loan Performed at [facility], [place] [address], [place], [place] [address]    Report Status 06/20/2022 FINAL  Final    Radiology Studies: No results found.    Merridith Dershem T. Elbert  If 7PM-7AM, please contact night-coverage [email or web address] 06/20/2022, 3:25 PM

## 2022-06-20 NOTE — Plan of Care (Signed)
  Problem: Education: Goal: Ability to demonstrate management of disease process will improve Outcome: Progressing Goal: Ability to verbalize understanding of medication therapies will improve Outcome: Progressing   Problem: Activity: Goal: Capacity to carry out activities will improve Outcome: Progressing   Problem: Cardiac: Goal: Ability to achieve and maintain adequate cardiopulmonary perfusion will improve Outcome: Progressing   Problem: Education: Goal: Knowledge of General Education information will improve Description: Including pain rating scale, medication(s)/side effects and non-pharmacologic comfort measures Outcome: Progressing   Problem: Clinical Measurements: Goal: Ability to maintain clinical measurements within normal limits will improve Outcome: Progressing Goal: Will remain free from infection Outcome: Progressing Goal: Diagnostic test results will improve Outcome: Progressing Goal: Respiratory complications will improve Outcome: Progressing Goal: Cardiovascular complication will be avoided Outcome: Progressing   Problem: Activity: Goal: Risk for activity intolerance will decrease Outcome: Progressing   Problem: Nutrition: Goal: Adequate nutrition will be maintained Outcome: Progressing   Problem: Coping: Goal: Level of anxiety will decrease Outcome: Progressing   Problem: Pain Managment: Goal: General experience of comfort will improve Outcome: Progressing   Problem: Safety: Goal: Ability to remain free from injury will improve Outcome: Progressing   Problem: Skin Integrity: Goal: Risk for impaired skin integrity will decrease Outcome: Progressing   Problem: Education: Goal: Knowledge of risk factors and measures for prevention of condition will improve Outcome: Progressing   Problem: Coping: Goal: Psychosocial and spiritual needs will be supported Outcome: Progressing   Problem: Respiratory: Goal: Will maintain a patent  airway Outcome: Progressing Goal: Complications related to the disease process, condition or treatment will be avoided or minimized Outcome: Progressing   Problem: Elimination: Goal: Will not experience complications related to bowel motility Outcome: Adequate for Discharge Goal: Will not experience complications related to urinary retention Outcome: Adequate for Discharge   Problem: Education: Goal: Individualized Educational Video(s) Outcome: Not Applicable

## 2022-06-21 ENCOUNTER — Inpatient Hospital Stay (HOSPITAL_COMMUNITY): Payer: Medicare Other

## 2022-06-21 DIAGNOSIS — J441 Chronic obstructive pulmonary disease with (acute) exacerbation: Secondary | ICD-10-CM | POA: Diagnosis not present

## 2022-06-21 DIAGNOSIS — U071 COVID-19: Secondary | ICD-10-CM

## 2022-06-21 DIAGNOSIS — I5043 Acute on chronic combined systolic (congestive) and diastolic (congestive) heart failure: Secondary | ICD-10-CM | POA: Diagnosis not present

## 2022-06-21 DIAGNOSIS — D696 Thrombocytopenia, unspecified: Secondary | ICD-10-CM | POA: Diagnosis not present

## 2022-06-21 DIAGNOSIS — R7989 Other specified abnormal findings of blood chemistry: Secondary | ICD-10-CM | POA: Diagnosis not present

## 2022-06-21 DIAGNOSIS — J9602 Acute respiratory failure with hypercapnia: Secondary | ICD-10-CM

## 2022-06-21 DIAGNOSIS — N1832 Chronic kidney disease, stage 3b: Secondary | ICD-10-CM | POA: Diagnosis not present

## 2022-06-21 LAB — CBC WITH DIFFERENTIAL/PLATELET
Abs Immature Granulocytes: 0.07 10*3/uL (ref 0.00–0.07)
Basophils Absolute: 0 10*3/uL (ref 0.0–0.1)
Basophils Relative: 0 %
Eosinophils Absolute: 0 10*3/uL (ref 0.0–0.5)
Eosinophils Relative: 0 %
HCT: 36.1 % — ABNORMAL LOW (ref 39.0–52.0)
Hemoglobin: 10.8 g/dL — ABNORMAL LOW (ref 13.0–17.0)
Immature Granulocytes: 1 %
Lymphocytes Relative: 2 %
Lymphs Abs: 0.2 10*3/uL — ABNORMAL LOW (ref 0.7–4.0)
MCH: 26.3 pg (ref 26.0–34.0)
MCHC: 29.9 g/dL — ABNORMAL LOW (ref 30.0–36.0)
MCV: 87.8 fL (ref 80.0–100.0)
Monocytes Absolute: 0.2 10*3/uL (ref 0.1–1.0)
Monocytes Relative: 2 %
Neutro Abs: 9.5 10*3/uL — ABNORMAL HIGH (ref 1.7–7.7)
Neutrophils Relative %: 95 %
Platelets: 73 10*3/uL — ABNORMAL LOW (ref 150–400)
RBC: 4.11 MIL/uL — ABNORMAL LOW (ref 4.22–5.81)
RDW: 17.2 % — ABNORMAL HIGH (ref 11.5–15.5)
WBC: 10 10*3/uL (ref 4.0–10.5)
nRBC: 0 % (ref 0.0–0.2)

## 2022-06-21 LAB — COMPREHENSIVE METABOLIC PANEL
ALT: 22 U/L (ref 0–44)
AST: 24 U/L (ref 15–41)
Albumin: 2.5 g/dL — ABNORMAL LOW (ref 3.5–5.0)
Alkaline Phosphatase: 77 U/L (ref 38–126)
Anion gap: 9 (ref 5–15)
BUN: 40 mg/dL — ABNORMAL HIGH (ref 8–23)
CO2: 33 mmol/L — ABNORMAL HIGH (ref 22–32)
Calcium: 8.1 mg/dL — ABNORMAL LOW (ref 8.9–10.3)
Chloride: 92 mmol/L — ABNORMAL LOW (ref 98–111)
Creatinine, Ser: 1.78 mg/dL — ABNORMAL HIGH (ref 0.61–1.24)
GFR, Estimated: 38 mL/min — ABNORMAL LOW (ref >60)
Glucose, Bld: 124 mg/dL — ABNORMAL HIGH (ref 70–99)
Potassium: 3.7 mmol/L (ref 3.5–5.1)
Sodium: 134 mmol/L — ABNORMAL LOW (ref 135–145)
Total Bilirubin: 0.3 mg/dL (ref 0.3–1.2)
Total Protein: 6.9 g/dL (ref 6.5–8.1)

## 2022-06-21 LAB — BLOOD GAS, ARTERIAL
Acid-Base Excess: 6.8 mmol/L — ABNORMAL HIGH (ref 0.0–2.0)
Acid-Base Excess: 10.4 mmol/L — ABNORMAL HIGH (ref 0.0–2.0)
Bicarbonate: 35.9 mmol/L — ABNORMAL HIGH (ref 20.0–28.0)
Bicarbonate: 37.4 mmol/L — ABNORMAL HIGH (ref 20.0–28.0)
Drawn by: 54496
Drawn by: 331471
FIO2: 60 %
FIO2: 25 %
O2 Content: 60 L/min
O2 Saturation: 94.1 %
O2 Saturation: 93.7 %
Patient temperature: 36.2
Patient temperature: 37
RATE: 94 resp/min
pCO2 arterial: 71 mmHg (ref 32–48)
pCO2 arterial: 59 mmHg — ABNORMAL HIGH (ref 32–48)
pH, Arterial: 7.31 — ABNORMAL LOW (ref 7.35–7.45)
pH, Arterial: 7.41 (ref 7.35–7.45)
pO2, Arterial: 64 mmHg — ABNORMAL LOW (ref 83–108)
pO2, Arterial: 63 mmHg — ABNORMAL LOW (ref 83–108)

## 2022-06-21 LAB — D-DIMER, QUANTITATIVE: D-Dimer, Quant: 7.99 ug/mL-FEU — ABNORMAL HIGH (ref 0.00–0.50)

## 2022-06-21 LAB — GLUCOSE, CAPILLARY
Glucose-Capillary: 127 mg/dL — ABNORMAL HIGH (ref 70–99)
Glucose-Capillary: 118 mg/dL — ABNORMAL HIGH (ref 70–99)
Glucose-Capillary: 131 mg/dL — ABNORMAL HIGH (ref 70–99)
Glucose-Capillary: 135 mg/dL — ABNORMAL HIGH (ref 70–99)

## 2022-06-21 LAB — MRSA NEXT GEN BY PCR, NASAL: MRSA by PCR Next Gen: NOT DETECTED

## 2022-06-21 LAB — C-REACTIVE PROTEIN: CRP: 4.2 mg/dL — ABNORMAL HIGH (ref <1.0)

## 2022-06-21 LAB — MAGNESIUM: Magnesium: 2.6 mg/dL — ABNORMAL HIGH (ref 1.7–2.4)

## 2022-06-21 LAB — PHOSPHORUS: Phosphorus: 4.1 mg/dL (ref 2.5–4.6)

## 2022-06-21 MED ORDER — BUDESONIDE 0.25 MG/2ML IN SUSP
0.2500 mg | Freq: Two times a day (BID) | RESPIRATORY_TRACT | Status: DC
  Administered 2022-06-21 – 2022-07-05 (×29): 0.25 mg via RESPIRATORY_TRACT
  Filled 2022-06-21 (×29): qty 2

## 2022-06-21 MED ORDER — METHYLPREDNISOLONE SODIUM SUCC 125 MG IJ SOLR
80.0000 mg | Freq: Two times a day (BID) | INTRAMUSCULAR | Status: DC
  Administered 2022-06-21 – 2022-06-24 (×8): 80 mg via INTRAVENOUS
  Filled 2022-06-21 (×9): qty 2

## 2022-06-21 MED ORDER — CHLORHEXIDINE GLUCONATE CLOTH 2 % EX PADS
6.0000 | MEDICATED_PAD | Freq: Every day | CUTANEOUS | Status: DC
  Administered 2022-06-21 – 2022-07-07 (×16): 6 via TOPICAL

## 2022-06-21 MED ORDER — ARFORMOTEROL TARTRATE 15 MCG/2ML IN NEBU
15.0000 ug | INHALATION_SOLUTION | Freq: Two times a day (BID) | RESPIRATORY_TRACT | Status: DC
  Administered 2022-06-21 – 2022-07-07 (×32): 15 ug via RESPIRATORY_TRACT
  Filled 2022-06-21 (×32): qty 2

## 2022-06-21 MED ORDER — FUROSEMIDE 10 MG/ML IJ SOLN
40.0000 mg | Freq: Every day | INTRAMUSCULAR | Status: DC
  Administered 2022-06-21 – 2022-06-25 (×5): 40 mg via INTRAVENOUS
  Filled 2022-06-21 (×5): qty 4

## 2022-06-21 MED ORDER — LIDOCAINE HCL 1 % IJ SOLN
INTRAMUSCULAR | Status: AC
  Administered 2022-06-21: 10 mL
  Filled 2022-06-21: qty 20

## 2022-06-21 MED ORDER — IOHEXOL 350 MG/ML SOLN
75.0000 mL | Freq: Once | INTRAVENOUS | Status: AC | PRN
  Administered 2022-06-21: 75 mL via INTRAVENOUS

## 2022-06-21 NOTE — Progress Notes (Signed)
PROGRESS NOTE  Paul Sandoval. FWY:[id number] DOB: 02-29-1941   PCP: Coral Spikes, DO  Patient is from: Home.  DOA: 06/09/2022 LOS: 2  Chief complaints Chief Complaint  Patient presents with   Shortness of Breath     Brief Narrative / Interim history: 81 year old M with PMH of combined CHF/ICM, CAD/CABG, COPD, CKD-3A, right lung cancer, HTN, osteoarthritis and anemia returning with shortness of breath, orthopnea and BLE edema and admitted for acute respiratory failure with hypoxia due to acute on chronic combined CHF, possible LLL pneumonia and COPD exacerbation.  BNP elevated to 1000 (higher than baseline).  CXR suggests LLL opacity.  Tested positive for COVID-19 as well.  Patient was started on IV Lasix, Decadron, ceftriaxone, azithromycin and Paxlovid.   Of note, recently hospitalized for COPD and CHF exacerbation 10/7-10/12.  Patient was continued on IV Lasix, Decadron, ceftriaxone, azithromycin and antiviral with improvement in his respiratory symptoms.  However, he developed respiratory distress with increased oxygen requirement the night of 10/25.  PCCM consulted and he was transferred to SDU.  Get Decadron to Solu-Medrol and inhalers DuoNebs.  CTA chest and lower extremity venous Doppler ordered to exclude VTE since patient was not on chemical VTE prophylaxis due to thrombocytopenia  Subjective: Seen and examined earlier this morning.  Patient developed respiratory distress with increased oxygen requirement overnight.  He is on 20 L and 60% FiO2 with heated high flow nasal cannula.  Reports improvement in his breathing compared to last night.  He denies chest pain.   Objective: Vitals:   06/21/22 0838 06/21/22 0848 06/21/22 1000 06/21/22 1153  BP:    (!) 118/52  Pulse:    94  Resp:   (!) 22 (!) 22  Temp:    97.8 F (36.6 C)  TempSrc:    Oral  SpO2: 93% 93% 94% 95%  Weight:      Height:        Examination:  GENERAL: No apparent distress.  Nontoxic. HEENT: MMM.   Vision and hearing grossly intact.  Hoarse voice. NECK: Supple.  No apparent JVD.  RESP:  No IWOB.  Fair aeration bilaterally.  Diminished aeration in right lung. CVS:  RRR. Heart sounds normal.  ABD/GI/GU: BS+. Abd soft, NTND.  MSK/EXT:  Moves extremities. No apparent deformity.  Has TED hose. SKIN: no apparent skin lesion or wound NEURO: Awake and alert.  No apparent focal neuro deficit. PSYCH: Calm. Normal affect.     Procedures:  None  Microbiology summarized: COVID-19 PCR positive.  Assessment and plan: Principal Problem:   Acute on chronic combined systolic and diastolic congestive heart failure (HCC) Active Problems:   HLD (hyperlipidemia)   Essential hypertension   GERD (gastroesophageal reflux disease)   CAD (coronary artery disease)   Thrombocytopenia (HCC)   Stage 3b chronic kidney disease (CKD) ([place])   Primary cancer of right upper lobe of lung (HCC)   Cardiomyopathy, ischemic   History of endovascular stent graft for abdominal aortic aneurysm (AAA)   Acute exacerbation of chronic obstructive pulmonary disease (COPD) (HCC)   Acute respiratory failure with hypoxia (HCC)   Anemia of chronic kidney failure   COVID-19 virus infection  Acute respiratory failure with hypoxia and hypercarbia: Multifactorial including acute CHF, possible COPD exacerbation, COVID-19 infection and LLL pneumonia.  Respiratory distress with increased oxygen requirement overnight.  ABG shows mild respiratory acidosis with hypercapnia.  CXR with bilateral pleural effusion, right> left and stable RLL consolidation.  Difficult to exclude PE since patient was not  on VTE prophylaxis due to thrombocytopenia.  His D-dimer is elevated. -PCCM consulted -Transfer to SDU for as needed BiPAP -Discontinue Decadron.  IV Solu-Medrol 80 mg twice daily -I agree with changing inhalers to nebulizers -CTA chest and lower extremity venous Doppler to exclude DVT. -IR thoracocentesis with fluid studies. -Incentive  spirometry, PT/OT  Acute on chronic combined CHF: Presents with SOB, orthopnea and BLE edema.  BNP evaded to 900s.  JVD, BNP and edema improving.  TTE in 03/2022 with LVEF of 30 to 35%, GH, G1-DD and mildly reduced RVSF.  Patient reports compliance with Farxiga, Lasix, diet and salt intake.  Net -2.3 L.  Cr stable.  Soft blood pressure but not symptomatic.  CXR with bilateral pleural effusion. -Continue IV Lasix -Continue holding Entresto setting of hypotension and renal failure -Hold Aldactone -Change Coreg to Toprol-XL.  Beta beta-1 selectivity and less effect on BP. -Strict intake and output, renal functions and electrolytes -Continue TED hose and leg elevation  Acute exacerbation of chronic obstructive pulmonary disease: Could be in the setting of COVID-19 infection and possible pneumonia. -Continue change Decadron to IV Solu-Medrol. -Continue CAP coverage -Agree with changing inhalers to nebulizers -May consider changing Coreg to Toprol-XL for better beta-1 selectivity -BiPAP as needed  COVID-19 infection: Fully vaccinated.  Elevated inflammatory markers.  Pro-Cal 0.19.  CRP improved but the D-dimer trended up. -Management as above -Continue isolation precaution  History of CAD s/p CABG in 1996 and PCI to dLM and SVG-D in 02/2014: Mildly elevated troponin.  No chest pain. -Continue home meds-Coreg, Plavix and Crestor -Outpatient follow-up with cardiology  CKD-3B/hyponatremia: Stable. Recent Labs    06/02/22 1913 06/03/22 0521 06/04/22 0935 06/05/22 0355 06/06/22 0339 06/07/22 0405 06/03/2022 0633 06/19/22 0410 06/20/22 0408 06/21/22 0410  BUN 28* 26* 41* 46* 51* 44* 25* 29* 35* 40*  CREATININE 1.81* 1.71* 1.86* 1.72* 1.85* 1.73* 1.76* 1.83* 1.79* 1.78*  -Hold Entresto and Aldactone.  Essential hypertension: soft BP this morning.  Now symptomatic. -Cardiac meds as above  GERD Continue PPI.  Thrombocytopenia/normocytic anemia: Stable. -Monitor     Hyperlipidemia -Continue home Crestor  Physical deconditioning -PT/OT eval     Body mass index is 24.97 kg/m.           DVT prophylaxis:  Place TED hose Start: 06/19/22 1130 SCDs Start: 06/21/2022 1016 due to thrombocytopenia  Code Status: Full code Family Communication: Updated patient's son over the phone. Level of care: Stepdown Status is: Inpatient Remains inpatient appropriate because: Acute respiratory failure with hypoxia and hypercapnia, CHF exacerbation, COPD exacerbation, COVID-19 infection and possible pneumonia   Final disposition: TBD Consultants:  Pulmonology  Sch Meds:  Scheduled Meds:  arformoterol  15 mcg Nebulization BID   budesonide (PULMICORT) nebulizer solution  0.25 mg Nebulization BID   Chlorhexidine Gluconate Cloth  6 each Topical Daily   clopidogrel  75 mg Oral Daily   dapagliflozin propanediol  10 mg Oral QAC breakfast   ipratropium-albuterol  3 mL Nebulization TID   methylPREDNISolone (SOLU-MEDROL) injection  80 mg Intravenous Q12H   metoprolol succinate  50 mg Oral Daily   molnupiravir EUA  4 capsule Oral BID   pantoprazole  40 mg Oral Daily   [START ON 06/24/2022] rosuvastatin  20 mg Oral QHS   Continuous Infusions:  azithromycin 500 mg (06/21/22 1059)   cefTRIAXone (ROCEPHIN)  IV Stopped (06/20/22 1007)   PRN Meds:.acetaminophen **OR** acetaminophen, albuterol, menthol-cetylpyridinium, ondansetron **OR** ondansetron (ZOFRAN) IV, sodium chloride  Antimicrobials: Anti-infectives (From admission, onward)  Start     Dose/Rate Route Frequency Ordered Stop   06/19/22 2200  molnupiravir EUA (LAGEVRIO) capsule 800 mg        4 capsule Oral 2 times daily 06/19/22 1514 06/24/22 2159   06/19/22 1000  azithromycin (ZITHROMAX) 500 mg in sodium chloride 0.9 % 250 mL IVPB        500 mg 250 mL/hr over 60 Minutes Intravenous Every 24 hours 06/04/2022 1931 06/24/22 0959   06/19/22 1000  cefTRIAXone (ROCEPHIN) 1 g in sodium chloride 0.9 % 100 mL  IVPB        1 g 200 mL/hr over 30 Minutes Intravenous Every 24 hours 06/12/2022 2120 06/23/22 0959   05/28/2022 2200  nirmatrelvir/ritonavir EUA (renal dosing) (PAXLOVID) 2 tablet  Status:  Discontinued       Note to Pharmacy: Please adjust or change treatment as needed.   2 tablet Oral 2 times daily 05/28/2022 1929 06/19/22 1514   06/09/2022 2030  cefTRIAXone (ROCEPHIN) 1 g in sodium chloride 0.9 % 100 mL IVPB  Status:  Discontinued        1 g 200 mL/hr over 30 Minutes Intravenous Every 24 hours 06/14/2022 1931 06/12/2022 2120   05/28/2022 1000  levofloxacin (LEVAQUIN) tablet 500 mg  Status:  Discontinued        500 mg Oral Daily 06/09/2022 0943 06/15/2022 1943        I have personally reviewed the following labs and images: CBC: Recent Labs  Lab 05/29/2022 0633 06/19/22 0410 06/20/22 0408 06/21/22 0410  WBC 7.7 5.7 4.9 10.0  NEUTROABS 7.1 5.4 4.6 9.5*  HGB 9.7* 9.9* 9.9* 10.8*  HCT 31.1* 32.0* 31.9* 36.1*  MCV 86.9 86.0 85.5 87.8  PLT 71* 68* 58* 73*   BMP &GFR Recent Labs  Lab 06/25/2022 0633 06/19/22 0410 06/20/22 0408 06/21/22 0410  NA 131* 130* 131* 134*  K 3.5 3.8 3.8 3.7  CL 95* 92* 91* 92*  CO2 29 29 29  33*  GLUCOSE 89 152* 141* 124*  BUN 25* 29* 35* 40*  CREATININE 1.76* 1.83* 1.79* 1.78*  CALCIUM 7.8* 7.8* 8.0* 8.1*  MG 1.9 2.1 2.2 2.6*  PHOS  --  4.9* 4.2 4.1   Estimated Creatinine Clearance: 33 mL/min (A) (by C-G formula based on SCr of 1.78 mg/dL (H)). Liver & Pancreas: Recent Labs  Lab 06/19/22 0410 06/20/22 0408 06/21/22 0410  AST 23 19 24   ALT 19 18 22   ALKPHOS 65 61 77  BILITOT 0.6 0.5 0.3  PROT 6.2* 6.1* 6.9  ALBUMIN 2.3* 2.2* 2.5*   No results for input(s): "LIPASE", "AMYLASE" in the last 168 hours. No results for input(s): "AMMONIA" in the last 168 hours. Diabetic: No results for input(s): "HGBA1C" in the last 72 hours. Recent Labs  Lab 06/20/22 0741 06/20/22 1104 06/20/22 1713 06/20/22 2006 06/21/22 0835  GLUCAP 123* 155* 216* 202* 127*    Cardiac Enzymes: Recent Labs  Lab 06/19/22 2210 06/20/22 0408  CKTOTAL 21* 20*   No results for input(s): "PROBNP" in the last 8760 hours. Coagulation Profile: No results for input(s): "INR", "PROTIME" in the last 168 hours. Thyroid Function Tests: No results for input(s): "TSH", "T4TOTAL", "FREET4", "T3FREE", "THYROIDAB" in the last 72 hours. Lipid Profile: No results for input(s): "CHOL", "HDL", "LDLCALC", "TRIG", "CHOLHDL", "LDLDIRECT" in the last 72 hours. Anemia Panel: Recent Labs    06/19/22 0410 06/20/22 0408  VITAMINB12  --  667  FOLATE  --  8.3  FERRITIN 2,703* 2,541*  TIBC  --  125*  IRON  --  37*  RETICCTPCT  --  0.5   Urine analysis:    Component Value Date/Time   COLORURINE YELLOW 06/03/2022 0307   APPEARANCEUR CLEAR 06/03/2022 0307   LABSPEC 1.013 06/03/2022 0307   LABSPEC 1.010 03/28/2016 1244   PHURINE 5.0 06/03/2022 0307   GLUCOSEU >=500 (A) 06/03/2022 0307   GLUCOSEU Negative 03/28/2016 1244   HGBUR NEGATIVE 06/03/2022 0307   BILIRUBINUR NEGATIVE 06/03/2022 0307   BILIRUBINUR Negative 03/28/2016 [place] 06/03/2022 0307   PROTEINUR 30 (A) 06/03/2022 0307   UROBILINOGEN 0.2 03/28/2016 1244   NITRITE NEGATIVE 06/03/2022 0307   LEUKOCYTESUR NEGATIVE 06/03/2022 0307   LEUKOCYTESUR Trace 03/28/2016 1244   Sepsis Labs: Invalid input(s): "PROCALCITONIN", "LACTICIDVEN"  Microbiology: Recent Results (from the past 240 hour(s))  SARS Coronavirus 2 by RT PCR (hospital order, performed in [facility] hospital lab) *cepheid single result test* Anterior Nasal Swab     Status: Abnormal   Collection Time: 06/26/2022  5:34 PM   Specimen: Anterior Nasal Swab  Result Value Ref Range Status   SARS Coronavirus 2 by RT PCR POSITIVE (A) NEGATIVE Final    Comment: (NOTE) SARS-CoV-2 target nucleic acids are DETECTED  SARS-CoV-2 RNA is generally detectable in upper respiratory specimens  during the acute phase of infection.  Positive results are  indicative  of the presence of the identified virus, but do not rule out bacterial infection or co-infection with other pathogens not detected by the test.  Clinical correlation with patient history and  other diagnostic information is necessary to determine patient infection status.  The expected result is negative.  Fact Sheet for Patients:   https://www.patel.info/   Fact Sheet for Healthcare Providers:   https://hall.com/    This test is not yet approved or cleared by the [place] FDA and  has been authorized for detection and/or diagnosis of SARS-CoV-2 by FDA under an Emergency Use Authorization (EUA).  This EUA will remain in effect (meaning this test can be used) for the duration of  the COVID-19 declaration under Section 564(b)(1)  of the Act, 21 U.S.C. section 360-bbb-3(b)(1), unless the authorization is terminated or revoked sooner.   Performed at [facility], [place] [address], [place], [place] [address]   Expectorated Sputum Assessment w Gram Stain, Rflx to Resp Cult     Status: None   Collection Time: 06/19/22  3:34 PM   Specimen: Expectorated Sputum  Result Value Ref Range Status   Specimen Description EXPECTORATED SPUTUM  Final   Special Requests NONE  Final   Sputum evaluation   Final    Sputum specimen not acceptable for testing.  Please recollect.   NOTIFIED SEZER,N RN AT 2103 ON 06/19/22 BY VAZQUEZJ Performed at [facility], [place] [address] [place], [place] [address]    Report Status 06/20/2022 FINAL  Final  Expectorated Sputum Assessment w Gram Stain, Rflx to Resp Cult     Status: None   Collection Time: 06/20/22 12:11 PM  Result Value Ref Range Status   Specimen Description EXPECTORATED SPUTUM  Final   Special Requests NONE  Final   Sputum evaluation   Final    Sputum specimen not acceptable for testing.  Please recollect.   Vernell Barrier, J RN @1412  ON 06/20/2022 BY Gloriann Loan Performed at [facility], [place] [address], [place], [place] [address]    Report Status 06/20/2022 FINAL  Final    Radiology Studies: [facility] Chest Port 1  View  Result Date: 06/20/2022 CLINICAL DATA:  Shortness of breath increasing. EXAM: PORTABLE CHEST 1 VIEW COMPARISON:  06/05/2022 FINDINGS: Postoperative changes in the mediastinum. Cardiac enlargement. Shallow inspiration. Bilateral pleural effusions, greater on the right. Consolidation and volume loss in the right upper lung with prominence of the right hilum. This is likely vascular as no mass was seen on previous chest CT from 03/12/2022. Right upper lung consolidation appears to be chronic and was present on that prior study. Consolidation again demonstrated in the left lung base, similar to prior chest radiograph. Calcification of the aorta. Degenerative changes in the spine. IMPRESSION: 1. Cardiac enlargement.  Bilateral pleural effusions. 2. Chronic consolidation and volume loss in the right upper lung. 3. Focal consolidation in the left lower lung is similar to prior chest radiograph. Electronically Signed   By: Lucienne Capers M.D.   On: 06/20/2022 20:56       Rocco T. [place]o  If 7PM-7AM, please contact night-coverage [email or web address] 06/21/2022, 1:29 PM

## 2022-06-21 NOTE — Consult Note (Addendum)
NAME:  Paul Minturn., MRN:  [id number], DOB:  04-Oct-1940, LOS: 2 ADMISSION DATE:  05/30/2022, CONSULTATION DATE:  06/21/22 REFERRING MD:  Dr. Cyndia Skeeters, CHIEF COMPLAINT:  SOB   History of Present Illness:  81 year old male with multiple co-morbidities including COPD and chronic combined heart failure and recent hospitalization for COPD/CHF exacerbation earlier this month who is re-admitted for CHF/COPD exacerbation in setting of COVID. This morning he had episode of respiratory distress and found in tripod position on 4L O2 with SpO2 in 70-80s. CXR with interval development of small bilateral pleural effusions. He was given lasix, albuterol neb, ativan for anxiety and advanced to heated high flow requiring FIO2 60% on 20L. PCCM consulted for additional recommendations  Pertinent  Medical History  Former smoker, COPD, Chronic HFrEF (03/2022> EF 30-35%/ G1DD/ mildly reduced RVSF)/ HFpEF, HLD, CKD 3b, anemia of chronic disease  Patient of Dr. Elsworth Soho  [facility] Events: Including procedures, antibiotic start and stop dates in addition to other pertinent events   10/23 Admitted to [facility] 10/25 Increased O2 need to heated high flow. PCCM consulted  Interim History / Subjective:  This morning he reports short of breath but improved compared to last night. He reports he feels slightly confused about his days and times. No wheezing at this time  Confirmed full code status  Objective   Blood pressure (!) 106/55, pulse 94, temperature (!) 97.2 F (36.2 C), temperature source Oral, resp. rate 18, height _0  (1.778 m), weight 78.9 kg, SpO2 93 %.    FiO2 (%):  [60 %] 60 %   Intake/Output Summary (Last 24 hours) at 06/21/2022 0924 Last data filed at 06/20/2022 2115 Gross per 24 hour  Intake 564.96 ml  Output 1550 ml  Net -985.04 ml   Filed Weights   06/04/2022 0605  Weight: 78.9 kg   Physical Exam: General: Elderly-appearing, no acute distress HENT: [place], AT, OP clear, MMM, heated  high flow nasal cannula Eyes: EOMI, no scleral icterus Respiratory: Clear to auscultation bilaterally.  No crackles, wheezing or rales Cardiovascular: RRR, -M/R/G, no JVD GI: BS+, soft, nontender Extremities:-Edema,-tenderness Neuro: AAO x4, CNII-XII grossly intact Skin: Intact, no rashes or bruising Psych: Normal mood, normal affect GU: Foley in place  CXR 06/21/22 bilateral pleural effusions R>L, chronic RUL volume loss seen on prior imaging  ABG with chronic hypercarbic respiratory failure. PO2 63 CRP 8.5>4.2 D-dimer 2.64>7.99 WBC 10   Resolved Hospital Problem list   N/A  Assessment & Plan:   Acute on chronic combined heart failure: EF 30-35%  with grade I DD on 03/2022. Acute hypoxemic respiratory failure 2/2 COPD exacerbation COVID infection --Agree with diuresis as tolerated. Goal net negative daily --Continue heated high flow for goal SpO2 >88% --No indication for intubation at this time. Monitor for signs and symptoms of respiratory distress including tachypnea, accessory muscle use, nasal flaring, worsening shortness of breath or fatigue --Consider BiPAP support for WOB and nightly. Beneficial is setting of pulm edema/effusions --Steroids for exacerbation and COVID --Changed Dulera to Pulmicort and Brovana nebs --Continue Duonebs TID --IS/PT/OOB as tolerated --Will follow-up CTA ordered by primary team  CKD IIIB  Cr 1.7-2.0  GOC Discussed GOC and confirmed full code status  Discussed plan with [facility] will follow  Best Practice (right click and "Reselect all SmartList Selections" daily)   Diet/type: Regular consistency (see orders) DVT prophylaxis: other thrombocytopenia. per primary team GI prophylaxis: PPI Lines: N/A Foley:  N/A Code Status:  full code Last date  of multidisciplinary goals of care discussion [10/26] Confirmed with patient  Labs   CBC: Recent Labs  Lab 06/12/2022 0633 06/19/22 0410 06/20/22 0408 06/21/22 0410  WBC 7.7 5.7  4.9 10.0  NEUTROABS 7.1 5.4 4.6 9.5*  HGB 9.7* 9.9* 9.9* 10.8*  HCT 31.1* 32.0* 31.9* 36.1*  MCV 86.9 86.0 85.5 87.8  PLT 71* 68* 58* 73*    Basic Metabolic Panel: Recent Labs  Lab 06/17/2022 0633 06/19/22 0410 06/20/22 0408 06/21/22 0410  NA 131* 130* 131* 134*  K 3.5 3.8 3.8 3.7  CL 95* 92* 91* 92*  CO2 _0 33*  GLUCOSE 89 152* 141* 124*  BUN 25* 29* 35* 40*  CREATININE 1.76* 1.83* 1.79* 1.78*  CALCIUM 7.8* 7.8* 8.0* 8.1*  MG 1.9 2.1 2.2 2.6*  PHOS  --  4.9* 4.2 4.1   GFR: Estimated Creatinine Clearance: 33 mL/min (A) (by C-G formula based on SCr of 1.78 mg/dL (H)). Recent Labs  Lab 06/07/2022 0633 06/22/2022 2017 06/19/22 0410 06/20/22 0408 06/21/22 0410  PROCALCITON  --  0.19 0.17  --   --   WBC 7.7  --  5.7 4.9 10.0    Liver Function Tests: Recent Labs  Lab 06/19/22 0410 06/20/22 0408 06/21/22 0410  AST _1 ALT _2 ALKPHOS 65 61 77  BILITOT 0.6 0.5 0.3  PROT 6.2* 6.1* 6.9  ALBUMIN 2.3* 2.2* 2.5*   No results for input(s): "LIPASE", "AMYLASE" in the last 168 hours. No results for input(s): "AMMONIA" in the last 168 hours.  ABG    Component Value Date/Time   PHART 7.41 06/21/2022 0715   PCO2ART 59 (H) 06/21/2022 0715   PO2ART 63 (L) 06/21/2022 0715   HCO3 37.4 (H) 06/21/2022 0715   TCO2 33 (H) 05/15/2017 0935   TCO2 33 (H) 05/15/2017 0935   ACIDBASEDEF 0.4 06/03/2022 0521   O2SAT 93.7 06/21/2022 0715     Coagulation Profile: No results for input(s): "INR", "PROTIME" in the last 168 hours.  Cardiac Enzymes: Recent Labs  Lab 06/19/22 2210 06/20/22 0408  CKTOTAL 21* 20*    HbA1C: No results found for: "HGBA1C"  CBG: Recent Labs  Lab 06/20/22 0741 06/20/22 1104 06/20/22 1713 06/20/22 2006 06/21/22 0835  GLUCAP 123* 155* 216* 202* 127*    Review of Systems:   Review of Systems  Constitutional:  Positive for malaise/fatigue. Negative for chills, diaphoresis, fever and weight loss.  HENT:  Negative for congestion.    Respiratory:  Positive for shortness of breath. Negative for cough, hemoptysis, sputum production and wheezing.   Cardiovascular:  Negative for chest pain, palpitations and leg swelling.  Neurological:  Positive for weakness.     Past Medical History:  He,  has a past medical history of AAA (abdominal aortic aneurysm) ([place]) (2010), Anemia, Arteriosclerotic cardiovascular disease (ASCVD) (1996), Arthritis, CAD (coronary artery disease), Cancer ([place]), Cardiomyopathy, ischemic, CHF (congestive heart failure) ([place]), Chronic bronchitis ([place]), Chronic kidney disease, Chronic rhinitis, Colonic polyp (2002), COPD (chronic obstructive pulmonary disease) ([place]), Diverticulosis, Dyspnea, ED (erectile dysfunction), Encounter for antineoplastic chemotherapy (12/19/2015), GERD (gastroesophageal reflux disease), History of blood transfusion, Hyperlipidemia, Hypertension, IFG (impaired fasting glucose), Myocardial infarction ([place]), Pneumonia (~ 2001; ~ 2005), Right bundle branch block, and Tobacco abuse, in remission.   Surgical History:   Past Surgical History:  Procedure Laterality Date   ABDOMINAL AORTIC ANEURYSM REPAIR  11/2012   ABDOMINAL AORTIC ENDOVASCULAR STENT GRAFT N/A 12/11/2012   Procedure: ABDOMINAL AORTIC ENDOVASCULAR STENT GRAFT;  Surgeon:  Mal Misty, MD;  Location: [place];  Service: Vascular;  Laterality: N/A;  Ultrasound guided; Lithium  01/08/1995   COLONOSCOPY  2002   polypectomy-patient denies   CORONARY ANGIOPLASTY WITH STENT PLACEMENT  03/18/2014   "1"   CORONARY ANGIOPLASTY WITH STENT PLACEMENT  03/24/2014   "1"   CORONARY ARTERY BYPASS GRAFT  01/09/1995   "CABG X3"   FLEXIBLE BRONCHOSCOPY N/A 03/01/2017   Procedure: FLEXIBLE BRONCHOSCOPY WITH BIOPSIES;  Surgeon: Gaye Pollack, MD;  Location: [place] [facility];  Service: Thoracic;  Laterality: N/A;   JOINT REPLACEMENT     LAPAROSCOPIC CHOLECYSTECTOMY  12/2009   LEFT AND RIGHT HEART CATHETERIZATION WITH CORONARY/GRAFT  ANGIOGRAM N/A 03/18/2014   Procedure: LEFT AND RIGHT HEART CATHETERIZATION WITH Beatrix Fetters;  Surgeon: Blane Ohara, MD;  Location: [facility] CATH LAB;  Service: Cardiovascular;  Laterality: N/A;   PERCUTANEOUS CORONARY STENT INTERVENTION (PCI-S)  03/18/2014   Procedure: PERCUTANEOUS CORONARY STENT INTERVENTION (PCI-S);  Surgeon: Blane Ohara, MD;  Location: [facility] CATH LAB;  Service: Cardiovascular;;   PERCUTANEOUS CORONARY STENT INTERVENTION (PCI-S) N/A 03/24/2014   Procedure: PERCUTANEOUS CORONARY STENT INTERVENTION (PCI-S);  Surgeon: Blane Ohara, MD;  Location[facility] C[facility] LAB;  Service: Cardiovascular;  Laterality: N/A;   PLEURAL EFFUSION DRAINAGE Right 03/01/2017   Procedure: DRAINAGE OF PLEURAL EFFUSION;  Surgeon: Gaye Pollack, MD;  Location: [place] OR;  Service: Thoracic;  Laterality: Right;   RIGHT/LEFT HEART CATH AND CORONARY/GRAFT ANGIOGRAPHY N/A 05/15/2017   Procedure: RIGHT/LEFT HEART CATH AND CORONARY/GRAFT ANGIOGRAPHY;  Surgeon: Larey Dresser, MD;  Location: [place] CV LAB;  Service: Cardiovascular;  Laterality: N/A;   TALC PLEURODESIS Right 03/01/2017   Procedure: Pietro Cassis;  Surgeon: Gaye Pollack, MD;  Location: [place];  Service: Thoracic;  Laterality: Right;   TOTAL HIP ARTHROPLASTY Left 01/21/2013   Procedure: TOTAL HIP ARTHROPLASTY ANTERIOR APPROACH;  Surgeon: Mauri Pole, MD;  Location: [place];  Service: Orthopedics;  Laterality: Left;   VIDEO ASSISTED THORACOSCOPY Right 03/01/2017   Procedure: VIDEO ASSISTED THORACOSCOPY WITH BIOPSIES;  Surgeon: Gaye Pollack, MD;  Location: [place];  Service: Thoracic;  Laterality: Right;   VIDEO BRONCHOSCOPY N/A 11/17/2015   Procedure: VIDEO BRONCHOSCOPY WITH FLUORO;  Surgeon: Rigoberto Noel, MD;  Location: [place];  Service: Cardiopulmonary;  Laterality: N/A;     Social History:   reports that he quit smoking about 27 years ago. His smoking use included cigarettes. He started smoking about 65 years ago. He has a 45.00  pack-year smoking history. He has never used smokeless tobacco. He reports that he does not drink alcohol and does not use drugs.   Family History:  His family history includes Arthritis in his mother and sister; Cancer in his father; Heart disease in his father; Hypertension in his brother; Parkinsonism in his mother. There is no history of Colon cancer or Colon polyps.   Allergies Allergies  Allergen Reactions   Neomycin Hives, Itching and Rash   Anoro Ellipta [Umeclidinium-Vilanterol] Itching and Rash    Rash over face, neck   Cetirizine & Related Rash   Stiolto Respimat [Tiotropium Bromide-Olodaterol] Itching and Rash    Rash over face , neck     Home Medications  Prior to Admission medications   Medication Sig Start Date End Date Taking? Authorizing Provider  acetaminophen (TYLENOL) 500 MG tablet Take 1,000 mg by mouth as needed for fever.   Yes [provider]  albuterol (VENTOLIN HFA) 108 (90  Base) MCG/ACT inhaler Inhale 2 puffs into the lungs every 6 (six) hours as needed for wheezing or shortness of breath. Patient taking differently: Inhale into the lungs in the morning, at noon, and at bedtime. 01/01/22  Yes Cook, Jayce G, DO  carvedilol (COREG) 12.5 MG tablet TAKE 1 &1/2 TABLET BY MOUTH TWICE DAILY WITH A MEAL Patient taking differently: Take 18.75 mg by mouth 2 (two) times daily with a meal. 02/12/22  Yes Larey Dresser, MD  clopidogrel (PLAVIX) 75 MG tablet TAKE (1) TABLET BY MOUTH ONCE DAILY. Patient taking differently: Take 75 mg by mouth daily. 01/23/22  Yes Larey Dresser, MD  dapagliflozin propanediol (FARXIGA) 10 MG TABS tablet Take 10 mg by mouth daily before breakfast. 08/27/19  Yes Larey Dresser, MD  Dextromethorphan-guaiFENesin (ROBITUSSIN DM PO) Take 20 mLs by mouth as needed (for cough).   Yes [provider]  eplerenone (INSPRA) 50 MG tablet Take 1 tablet (50 mg total) by mouth every evening. 04/05/20  Yes Larey Dresser, MD   fluticasone-salmeterol [facility] INHUB) 100-50 MCG/ACT AEPB Inhale 1 puff into the lungs 2 (two) times daily. 12/11/21  Yes Rigoberto Noel, MD  furosemide (LASIX) 20 MG tablet Take 20 mg by mouth daily.   Yes [provider]  ipratropium-albuterol (DUONEB) 0.5-2.5 (3) MG/3ML SOLN Take 3 mLs by nebulization in the morning, at noon, and at bedtime. 11/10/21  Yes Rigoberto Noel, MD  levofloxacin (LEVAQUIN) 500 MG tablet Take 1 tablet (500 mg total) by mouth daily for 7 days. 06/14/22 06/21/22 Yes Rigoberto Noel, MD  nitroGLYCERIN (NITROSTAT) 0.4 MG SL tablet PLACE 1 TAB UNDER TONGUE EVERY 5 MIN IF NEEDED FOR CHEST PAIN. MAY USE 3 TIMES.NO RELIEF CALL 911. Patient taking differently: Place 0.4 mg under the tongue every 5 (five) minutes as needed for chest pain. 01/01/22  Yes Cook, Jayce G, DO  Olopatadine HCl (PATADAY OP) Place 1 drop into both eyes as needed (dry eye).   Yes [provider]  omeprazole (PRILOSEC) 20 MG capsule Take 1 capsule (20 mg total) by mouth daily. 03/14/22  Yes Cook, Jayce G, DO  predniSONE (DELTASONE) 10 MG tablet Take 10 mg by mouth daily.   Yes [provider]  rosuvastatin (CRESTOR) 20 MG tablet TAKE ONE TABLET BY MOUTH ONCE DAILY. Patient taking differently: Take 20 mg by mouth at bedtime. 03/20/22  Yes Larey Dresser, MD  sacubitril-valsartan (ENTRESTO) 97-103 MG Take 0.5 tablets by mouth 2 (two) times daily. 07/13/20  Yes [provider]  sodium chloride (OCEAN) 0.65 % SOLN nasal spray Place 2 sprays into both nostrils as needed for congestion.   Yes [provider]  fluticasone (FLONASE) 50 MCG/ACT nasal spray Place 1 spray into both nostrils daily as needed for allergies or rhinitis. Patient not taking: Reported on 06/07/2022 11/16/21   Rigoberto Noel, MD  nystatin (MYCOSTATIN) 100000 UNIT/ML suspension Take 5 mLs (500,000 Units total) by mouth 4 (four) times daily. Patient not taking: Reported on 06/03/2022 03/06/22   Rigoberto Noel, MD     Critical care time: n/a    MDM: High for respiratory failure, GOC  Rodman Pickle, MD [facility] 06/21/2022, 9:24 AM  See Shea Evans for pager If no response to pager, please call PCCM consult pager After 7:00 pm call Elink

## 2022-06-21 NOTE — Procedures (Signed)
PROCEDURE SUMMARY:  Successful US guided right thoracentesis. Yielded scant blood-tinged serosanguineous fluid - not enough to get any fluid into the syringe for labs.  Pt tolerated procedure well. No immediate complications.  Specimen not sent for labs. CXR ordered; pending  EBL < 5 mL  Theresa Duty, NP 06/21/2022 3:33 PM

## 2022-06-21 NOTE — Progress Notes (Signed)
RT stuck PT for ABG. Called lab to inform them sample was sent at [phone]

## 2022-06-21 NOTE — Progress Notes (Signed)
Bilateral lower extremity venous duplex completed. Refer to "CV Proc" under chart review to view preliminary results.  06/21/2022 1:58 PM Kelby Aline., MHA, RVT, RDCS, RDMS

## 2022-06-22 DIAGNOSIS — I5043 Acute on chronic combined systolic (congestive) and diastolic (congestive) heart failure: Secondary | ICD-10-CM | POA: Diagnosis not present

## 2022-06-22 DIAGNOSIS — J90 Pleural effusion, not elsewhere classified: Secondary | ICD-10-CM

## 2022-06-22 DIAGNOSIS — R339 Retention of urine, unspecified: Secondary | ICD-10-CM | POA: Insufficient documentation

## 2022-06-22 DIAGNOSIS — U071 COVID-19: Secondary | ICD-10-CM | POA: Diagnosis not present

## 2022-06-22 DIAGNOSIS — J441 Chronic obstructive pulmonary disease with (acute) exacerbation: Secondary | ICD-10-CM | POA: Diagnosis not present

## 2022-06-22 DIAGNOSIS — N1832 Chronic kidney disease, stage 3b: Secondary | ICD-10-CM | POA: Diagnosis not present

## 2022-06-22 DIAGNOSIS — J9601 Acute respiratory failure with hypoxia: Secondary | ICD-10-CM | POA: Diagnosis not present

## 2022-06-22 DIAGNOSIS — R338 Other retention of urine: Secondary | ICD-10-CM

## 2022-06-22 DIAGNOSIS — J690 Pneumonitis due to inhalation of food and vomit: Secondary | ICD-10-CM

## 2022-06-22 DIAGNOSIS — D696 Thrombocytopenia, unspecified: Secondary | ICD-10-CM | POA: Diagnosis not present

## 2022-06-22 LAB — GLUCOSE, CAPILLARY
Glucose-Capillary: 143 mg/dL — ABNORMAL HIGH (ref 70–99)
Glucose-Capillary: 131 mg/dL — ABNORMAL HIGH (ref 70–99)
Glucose-Capillary: 173 mg/dL — ABNORMAL HIGH (ref 70–99)

## 2022-06-22 LAB — BRAIN NATRIURETIC PEPTIDE: B Natriuretic Peptide: 778.9 pg/mL — ABNORMAL HIGH (ref 0.0–100.0)

## 2022-06-22 LAB — CBC WITH DIFFERENTIAL/PLATELET
Abs Immature Granulocytes: 0.05 10*3/uL (ref 0.00–0.07)
Basophils Absolute: 0 10*3/uL (ref 0.0–0.1)
Basophils Relative: 0 %
Eosinophils Absolute: 0 10*3/uL (ref 0.0–0.5)
Eosinophils Relative: 0 %
HCT: 31.5 % — ABNORMAL LOW (ref 39.0–52.0)
Hemoglobin: 9.9 g/dL — ABNORMAL LOW (ref 13.0–17.0)
Immature Granulocytes: 1 %
Lymphocytes Relative: 2 %
Lymphs Abs: 0.1 10*3/uL — ABNORMAL LOW (ref 0.7–4.0)
MCH: 26.9 pg (ref 26.0–34.0)
MCHC: 31.4 g/dL (ref 30.0–36.0)
MCV: 85.6 fL (ref 80.0–100.0)
Monocytes Absolute: 0.2 10*3/uL (ref 0.1–1.0)
Monocytes Relative: 2 %
Neutro Abs: 7.9 10*3/uL — ABNORMAL HIGH (ref 1.7–7.7)
Neutrophils Relative %: 95 %
Platelets: 55 10*3/uL — ABNORMAL LOW (ref 150–400)
RBC: 3.68 MIL/uL — ABNORMAL LOW (ref 4.22–5.81)
RDW: 17 % — ABNORMAL HIGH (ref 11.5–15.5)
WBC: 8.3 10*3/uL (ref 4.0–10.5)
nRBC: 0 % (ref 0.0–0.2)

## 2022-06-22 LAB — COMPREHENSIVE METABOLIC PANEL
ALT: 17 U/L (ref 0–44)
AST: 19 U/L (ref 15–41)
Albumin: 2.2 g/dL — ABNORMAL LOW (ref 3.5–5.0)
Alkaline Phosphatase: 61 U/L (ref 38–126)
Anion gap: 10 (ref 5–15)
BUN: 36 mg/dL — ABNORMAL HIGH (ref 8–23)
CO2: 33 mmol/L — ABNORMAL HIGH (ref 22–32)
Calcium: 7.6 mg/dL — ABNORMAL LOW (ref 8.9–10.3)
Chloride: 93 mmol/L — ABNORMAL LOW (ref 98–111)
Creatinine, Ser: 1.55 mg/dL — ABNORMAL HIGH (ref 0.61–1.24)
GFR, Estimated: 44 mL/min — ABNORMAL LOW (ref >60)
Glucose, Bld: 137 mg/dL — ABNORMAL HIGH (ref 70–99)
Potassium: 3.4 mmol/L — ABNORMAL LOW (ref 3.5–5.1)
Sodium: 136 mmol/L (ref 135–145)
Total Bilirubin: 0.7 mg/dL (ref 0.3–1.2)
Total Protein: 5.9 g/dL — ABNORMAL LOW (ref 6.5–8.1)

## 2022-06-22 LAB — TROPONIN I (HIGH SENSITIVITY)
Troponin I (High Sensitivity): 193 ng/L (ref <18)
Troponin I (High Sensitivity): 177 ng/L (ref <18)

## 2022-06-22 LAB — PHOSPHORUS: Phosphorus: 3.2 mg/dL (ref 2.5–4.6)

## 2022-06-22 LAB — D-DIMER, QUANTITATIVE: D-Dimer, Quant: 7.7 ug/mL-FEU — ABNORMAL HIGH (ref 0.00–0.50)

## 2022-06-22 LAB — MAGNESIUM: Magnesium: 2.2 mg/dL (ref 1.7–2.4)

## 2022-06-22 LAB — C-REACTIVE PROTEIN: CRP: 2.4 mg/dL — ABNORMAL HIGH (ref <1.0)

## 2022-06-22 MED ORDER — REVEFENACIN 175 MCG/3ML IN SOLN
175.0000 ug | Freq: Every day | RESPIRATORY_TRACT | Status: DC
  Administered 2022-06-22 – 2022-07-07 (×16): 175 ug via RESPIRATORY_TRACT
  Filled 2022-06-22 (×16): qty 3

## 2022-06-22 MED ORDER — LEVALBUTEROL HCL 0.63 MG/3ML IN NEBU
0.6300 mg | INHALATION_SOLUTION | RESPIRATORY_TRACT | Status: DC | PRN
  Administered 2022-07-01 – 2022-07-07 (×5): 0.63 mg via RESPIRATORY_TRACT
  Filled 2022-06-22 (×6): qty 3

## 2022-06-22 MED ORDER — TAMSULOSIN HCL 0.4 MG PO CAPS
0.4000 mg | ORAL_CAPSULE | Freq: Every day | ORAL | Status: DC
  Administered 2022-06-22 – 2022-06-26 (×5): 0.4 mg via ORAL
  Filled 2022-06-22 (×5): qty 1

## 2022-06-22 MED ORDER — POTASSIUM CHLORIDE CRYS ER 20 MEQ PO TBCR
40.0000 meq | EXTENDED_RELEASE_TABLET | Freq: Once | ORAL | Status: AC
  Administered 2022-06-22: 40 meq via ORAL
  Filled 2022-06-22: qty 2

## 2022-06-22 MED ORDER — IPRATROPIUM BROMIDE 0.02 % IN SOLN: 0.5000 mg | RESPIRATORY_TRACT | Status: DC | PRN

## 2022-06-22 MED ORDER — SODIUM CHLORIDE 0.9 % IV SOLN
3.0000 g | Freq: Four times a day (QID) | INTRAVENOUS | Status: DC
  Administered 2022-06-23 – 2022-07-02 (×37): 3 g via INTRAVENOUS
  Filled 2022-06-22 (×38): qty 8

## 2022-06-22 NOTE — Consult Note (Signed)
NAME:  Paul Sandoval., MRN:  [id number], DOB:  Oct 14, 1940, LOS: 3 ADMISSION DATE:  06/17/2022, CONSULTATION DATE:  06/21/22 REFERRING MD:  Dr. Cyndia Skeeters, CHIEF COMPLAINT:  SOB   History of Present Illness:  81 year old male with multiple co-morbidities including COPD and chronic combined heart failure and recent hospitalization for COPD/CHF exacerbation earlier this month who is re-admitted for CHF/COPD exacerbation in setting of COVID. This morning he had episode of respiratory distress and found in tripod position on 4L O2 with SpO2 in 70-80s. CXR with interval development of small bilateral pleural effusions. He was given lasix, albuterol neb, ativan for anxiety and advanced to heated high flow requiring FIO2 60% on 20L. PCCM consulted for additional recommendations  Pertinent  Medical History  Former smoker, COPD, Chronic HFrEF (03/2022> EF 30-35%/ G1DD/ mildly reduced RVSF)/ HFpEF, HLD, CKD 3b, anemia of chronic disease  Patient of Dr. Elsworth Soho  [facility] Events: Including procedures, antibiotic start and stop dates in addition to other pertinent events   10/23 Admitted to [facility] 10/25 Increased O2 need to heated high flow. PCCM consulted 10/26 attempted right thoracentesis, no fluid obtained 10/26 CT-PA > no PE.  Right lower lobe consolidation with mucous, obstructing material right lower lobe and right middle lobe bronchi.  Left lower lobe and lingular airspace disease.  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  Right basilar pleural 10.8 cm fluid collection  Interim History / Subjective:   Still w some cough Feels slightly better, still dyspneic Has not required BiPAP   Objective   Blood pressure 133/61, pulse (!) 102, temperature 97.6 F (36.4 C), temperature source Oral, resp. rate 20, height _0  (1.778 m), weight 78.9 kg, SpO2 94 %.    FiO2 (%):  [60 %-90 %] 90 %  No intake or output data in the 24 hours ending 06/22/22  0906  Filed Weights   06/25/2022 0605  Weight: 78.9 kg   Physical Exam: General: No distress but uncomfortable.  High flow nasal cannula in place HENT: Oropharynx dry, Eyes: Pupils equal Respiratory: Clear on the right, some soft inspiratory crackles left base.  No wheezing Cardiovascular: Distant, regular, no murmur GI: Nondistended, positive bowel sounds Extremities no edema Neuro: Awake and alert, interacts properly, answers questions and follows commands Skin: No rash Psych: Normal mood, normal affect GU: Foley in place    Resolved Hospital Problem list   N/A  Assessment & Plan:   Acute on chronic combined heart failure: EF 30-35%  with grade I DD on 03/2022. Acute hypoxemic respiratory failure 2/2 COPD exacerbation COVID infection -Continue heated high flow nasal cannula. -Could consider BiPAP support if he becomes fatigued, decompensates.  No evidence of this at this time.  May benefit from scheduled BiPAP nightly to help with overall work of breathing.  Question whether he would tolerate -Continue corticosteroids as ordered for his exacerbation COPD and COVID-19 -Nebulized Pulmicort, Brovana (Dulera held) -Nebulized albuterol/ipratropium scheduled -Continue IS, PT, pulmonary hygiene  Loculated right pleural effusion -Unsuccessful thoracentesis due to loculations -May require further intervention depending on course, including possible TCTS evaluation for possible VATS.  Could also consider insertion pigtail chest tube although unclear that fluid will adequately drain -Follow chest x-ray  CKD IIIB  Cr 1.7-2.0  GOC -Full code  Best Practice (right click and "Reselect all SmartList Selections" daily)   Diet/type: Regular consistency (see orders) DVT prophylaxis: other thrombocytopenia. per primary team GI prophylaxis: PPI Lines: N/A Foley:  N/A Code Status:  full code Last date of multidisciplinary goals of care discussion [10/26] Confirmed with patient  Labs    CBC: Recent Labs  Lab 06/25/2022 0633 06/19/22 0410 06/20/22 0408 06/21/22 0410 06/22/22 0255  WBC 7.7 5.7 4.9 10.0 8.3  NEUTROABS 7.1 5.4 4.6 9.5* 7.9*  HGB 9.7* 9.9* 9.9* 10.8* 9.9*  HCT 31.1* 32.0* 31.9* 36.1* 31.5*  MCV 86.9 86.0 85.5 87.8 85.6  PLT 71* 68* 58* 73* 55*     Basic Metabolic Panel: Recent Labs  Lab 06/15/2022 0633 06/19/22 0410 06/20/22 0408 06/21/22 0410 06/22/22 0255  NA 131* 130* 131* 134* 136  K 3.5 3.8 3.8 3.7 3.4*  CL 95* 92* 91* 92* 93*  CO2 _0 33* 33*  GLUCOSE 89 152* 141* 124* 137*  BUN 25* 29* 35* 40* 36*  CREATININE 1.76* 1.83* 1.79* 1.78* 1.55*  CALCIUM 7.8* 7.8* 8.0* 8.1* 7.6*  MG 1.9 2.1 2.2 2.6* 2.2  PHOS  --  4.9* 4.2 4.1 3.2    GFR: Estimated Creatinine Clearance: 37.9 mL/min (A) (by C-G formula based on SCr of 1.55 mg/dL (H)). Recent Labs  Lab 06/07/2022 2017 06/19/22 0410 06/20/22 0408 06/21/22 0410 06/22/22 0255  PROCALCITON 0.19 0.17  --   --   --   WBC  --  5.7 4.9 10.0 8.3     Liver Function Tests: Recent Labs  Lab 06/19/22 0410 06/20/22 0408 06/21/22 0410 06/22/22 0255  AST _1 ALT _2 ALKPHOS 65 61 77 61  BILITOT 0.6 0.5 0.3 0.7  PROT 6.2* 6.1* 6.9 5.9*  ALBUMIN 2.3* 2.2* 2.5* 2.2*    No results for input(s): "LIPASE", "AMYLASE" in the last 168 hours. No results for input(s): "AMMONIA" in the last 168 hours.  ABG    Component Value Date/Time   PHART 7.41 06/21/2022 0715   PCO2ART 59 (H) 06/21/2022 0715   PO2ART 63 (L) 06/21/2022 0715   HCO3 37.4 (H) 06/21/2022 0715   TCO2 33 (H) 05/15/2017 0935   TCO2 33 (H) 05/15/2017 0935   ACIDBASEDEF 0.4 06/03/2022 0521   O2SAT 93.7 06/21/2022 0715     Coagulation Profile: No results for input(s): "INR", "PROTIME" in the last 168 hours.  Cardiac Enzymes: Recent Labs  Lab 06/19/22 2210 06/20/22 0408  CKTOTAL 21* 20*     HbA1C: No results found for: "HGBA1C"  CBG: Recent Labs  Lab 06/21/22 0835 06/21/22 1426  06/21/22 1643 06/21/22 2114 06/22/22 0730  GLUCAP 127* 118* 131* 135* 143*     Review of Systems:   Review of Systems  Constitutional:  Positive for malaise/fatigue. Negative for chills, diaphoresis, fever and weight loss.  HENT:  Negative for congestion.   Respiratory:  Positive for shortness of breath. Negative for cough, hemoptysis, sputum production and wheezing.   Cardiovascular:  Negative for chest pain, palpitations and leg swelling.  Neurological:  Positive for weakness.     Past Medical History:  He,  has a past medical history of AAA (abdominal aortic aneurysm) ([place]) (2010), Anemia, Arteriosclerotic cardiovascular disease (ASCVD) (1996), Arthritis, CAD (coronary artery disease), Cancer ([place]), Cardiomyopathy, ischemic, CHF (congestive heart failure) ([place]), Chronic bronchitis ([place]), Chronic kidney disease, Chronic rhinitis, Colonic polyp (2002), COPD (chronic obstructive pulmonary disease) ([place]), Diverticulosis, Dyspnea, ED (erectile dysfunction), Encounter for antineoplastic chemotherapy (12/19/2015), GERD (gastroesophageal reflux disease), History of blood transfusion, Hyperlipidemia, Hypertension, IFG (impaired fasting glucose), Myocardial infarction ([place]), Pneumonia (~ 2001; ~ 2005), Right bundle branch block, and Tobacco abuse, in remission.  Surgical History:   Past Surgical History:  Procedure Laterality Date   ABDOMINAL AORTIC ANEURYSM REPAIR  11/2012   ABDOMINAL AORTIC ENDOVASCULAR STENT GRAFT N/A 12/11/2012   Procedure: ABDOMINAL AORTIC ENDOVASCULAR STENT GRAFT;  Surgeon: Mal Misty, MD;  Location: [place];  Service: Vascular;  Laterality: N/A;  Ultrasound guided; [place]  01/08/1995   COLONOSCOPY  2002   polypectomy-patient denies   CORONARY ANGIOPLASTY WITH STENT PLACEMENT  03/18/2014   "1"   CORONARY ANGIOPLASTY WITH STENT PLACEMENT  03/24/2014   "1"   CORONARY ARTERY BYPASS GRAFT  01/09/1995   "CABG X3"   FLEXIBLE BRONCHOSCOPY N/A 03/01/2017    Procedure: FLEXIBLE BRONCHOSCOPY WITH BIOPSIES;  Surgeon: Gaye Pollack, MD;  Location: [place] [facility];  Service: Thoracic;  Laterality: N/A;   JOINT REPLACEMENT     LAPAROSCOPIC CHOLECYSTECTOMY  12/2009   LEFT AND RIGHT HEART CATHETERIZATION WITH CORONARY/GRAFT ANGIOGRAM N/A 03/18/2014   Procedure: LEFT AND RIGHT HEART CATHETERIZATION WITH Beatrix Fetters;  Surgeon: Blane Ohara, MD;  Location: [facility] CATH LAB;  Service: Cardiovascular;  Laterality: N/A;   PERCUTANEOUS CORONARY STENT INTERVENTION (PCI-S)  03/18/2014   Procedure: PERCUTANEOUS CORONARY STENT INTERVENTION (PCI-S);  Surgeon: Blane Ohara, MD;  Location: [facility] CATH LAB;  Service: Cardiovascular;;   PERCUTANEOUS CORONARY STENT INTERVENTION (PCI-S) N/A 03/24/2014   Procedure: PERCUTANEOUS CORONARY STENT INTERVENTION (PCI-S);  Surgeon: Blane Ohara, MD;  Location: [facility] CATH LAB;  Service: Cardiovascular;  Laterality: N/A;   PLEURAL EFFUSION DRAINAGE Right 03/01/2017   Procedure: DRAINAGE OF PLEURAL EFFUSION;  Surgeon: Gaye Pollack, MD;  Location: [place] [facility];  Service: Thoracic;  Laterality: Right;   RIGHT/LEFT HEART CATH AND CORONARY/GRAFT ANGIOGRAPHY N/A 05/15/2017   Procedure: RIGHT/LEFT HEART CATH AND CORONARY/GRAFT ANGIOGRAPHY;  Surgeon: Larey Dresser, MD;  Location: [place] CV LAB;  Service: Cardiovascular;  Laterality: N/A;   TALC PLEURODESIS Right 03/01/2017   Procedure: Pietro Cassis;  Surgeon: Gaye Pollack, MD;  Location: [place];  Service: Thoracic;  Laterality: Right;   TOTAL HIP ARTHROPLASTY Left 01/21/2013   Procedure: TOTAL HIP ARTHROPLASTY ANTERIOR APPROACH;  Surgeon: Mauri Pole, MD;  Location: [employer];  Service: Orthopedics;  Laterality: Left;   VIDEO ASSISTED THORACOSCOPY Right 03/01/2017   Procedure: VIDEO ASSISTED THORACOSCOPY WITH BIOPSIES;  Surgeon: Gaye Pollack, MD;  Location: [place];  Service: Thoracic;  Laterality: Right;   VIDEO BRONCHOSCOPY N/A 11/17/2015   Procedure: VIDEO BRONCHOSCOPY WITH FLUORO;   Surgeon: Rigoberto Noel, MD;  Location: [place];  Service: Cardiopulmonary;  Laterality: N/A;     Social History:   reports that he quit smoking about 27 years ago. His smoking use included cigarettes. He started smoking about 65 years ago. He has a 45.00 pack-year smoking history. He has never used smokeless tobacco. He reports that he does not drink alcohol and does not use drugs.   Family History:  His family history includes Arthritis in his mother and sister; Cancer in his father; Heart disease in his father; Hypertension in his brother; Parkinsonism in his mother. There is no history of Colon cancer or Colon polyps.   Allergies Allergies  Allergen Reactions   Neomycin Hives, Itching and Rash   Anoro Ellipta [Umeclidinium-Vilanterol] Itching and Rash    Rash over face, neck   Cetirizine & Related Rash   Stiolto Respimat [Tiotropium Bromide-Olodaterol] Itching and Rash    Rash over face , neck     Home Medications  Prior to Admission medications  Medication Sig Start Date End Date Taking? Authorizing Provider  acetaminophen (TYLENOL) 500 MG tablet Take 1,000 mg by mouth as needed for fever.   Yes [provider]  albuterol (VENTOLIN HFA) 108 (90 Base) MCG/ACT inhaler Inhale 2 puffs into the lungs every 6 (six) hours as needed for wheezing or shortness of breath. Patient taking differently: Inhale into the lungs in the morning, at noon, and at bedtime. 01/01/22  Yes Cook, Jayce G, DO  carvedilol (COREG) 12.5 MG tablet TAKE 1 &1/2 TABLET BY MOUTH TWICE DAILY WITH A MEAL Patient taking differently: Take 18.75 mg by mouth 2 (two) times daily with a meal. 02/12/22  Yes Larey Dresser, MD  clopidogrel (PLAVIX) 75 MG tablet TAKE (1) TABLET BY MOUTH ONCE DAILY. Patient taking differently: Take 75 mg by mouth daily. 01/23/22  Yes Larey Dresser, MD  dapagliflozin propanediol (FARXIGA) 10 MG TABS tablet Take 10 mg by mouth daily before breakfast. 08/27/19  Yes Larey Dresser,  MD  Dextromethorphan-guaiFENesin (ROBITUSSIN DM PO) Take 20 mLs by mouth as needed (for cough).   Yes [provider]  eplerenone (INSPRA) 50 MG tablet Take 1 tablet (50 mg total) by mouth every evening. 04/05/20  Yes Larey Dresser, MD  fluticasone-salmeterol [facility] INHUB) 100-50 MCG/ACT AEPB Inhale 1 puff into the lungs 2 (two) times daily. 12/11/21  Yes Rigoberto Noel, MD  furosemide (LASIX) 20 MG tablet Take 20 mg by mouth daily.   Yes [provider]  ipratropium-albuterol (DUONEB) 0.5-2.5 (3) MG/3ML SOLN Take 3 mLs by nebulization in the morning, at noon, and at bedtime. 11/10/21  Yes Rigoberto Noel, MD  levofloxacin (LEVAQUIN) 500 MG tablet Take 1 tablet (500 mg total) by mouth daily for 7 days. 06/14/22 06/21/22 Yes Rigoberto Noel, MD  nitroGLYCERIN (NITROSTAT) 0.4 MG SL tablet PLACE 1 TAB UNDER TONGUE EVERY 5 MIN IF NEEDED FOR CHEST PAIN. MAY USE 3 TIMES.NO RELIEF CALL 911. Patient taking differently: Place 0.4 mg under the tongue every 5 (five) minutes as needed for chest pain. 01/01/22  Yes Cook, Jayce G, DO  Olopatadine HCl (PATADAY OP) Place 1 drop into both eyes as needed (dry eye).   Yes [provider]  omeprazole (PRILOSEC) 20 MG capsule Take 1 capsule (20 mg total) by mouth daily. 03/14/22  Yes Cook, Jayce G, DO  predniSONE (DELTASONE) 10 MG tablet Take 10 mg by mouth daily.   Yes [provider]  rosuvastatin (CRESTOR) 20 MG tablet TAKE ONE TABLET BY MOUTH ONCE DAILY. Patient taking differently: Take 20 mg by mouth at bedtime. 03/20/22  Yes Larey Dresser, MD  sacubitril-valsartan (ENTRESTO) 97-103 MG Take 0.5 tablets by mouth 2 (two) times daily. 07/13/20  Yes [provider]  sodium chloride (OCEAN) 0.65 % SOLN nasal spray Place 2 sprays into both nostrils as needed for congestion.   Yes [provider]  fluticasone (FLONASE) 50 MCG/ACT nasal spray Place 1 spray into both nostrils daily as needed for allergies or  rhinitis. Patient not taking: Reported on 06/04/2022 11/16/21   Rigoberto Noel, MD  nystatin (MYCOSTATIN) 100000 UNIT/ML suspension Take 5 mLs (500,000 Units total) by mouth 4 (four) times daily. Patient not taking: Reported on 06/16/2022 03/06/22   Rigoberto Noel, MD     Critical care time: n/a    Baltazar Apo, MD, PhD 06/22/2022, 9:33 AM [facility] [phone] or if no answer before 7:00PM call [phone] For any issues after 7:00PM please call  eLink [phone]

## 2022-06-22 NOTE — Progress Notes (Signed)
[place] Progress Note Patient [name]. DOB: [birth year] MRN: [id number]   Date of Service  06/22/2022  HPI/Events of Note  Hypokalemia - K+ = 3.4 and Creatinine = 1.55.  eICU Interventions  Will replace K+.     Intervention Category Major Interventions: Electrolyte abnormality - evaluation and management  Paul Sandoval 06/22/2022, 5:19 AM

## 2022-06-22 NOTE — Progress Notes (Addendum)
PROGRESS NOTE  Paul Sandoval. TWS:[id number] DOB: 01/16/1941   PCP: Coral Spikes, DO  Patient is from: Home.  DOA: 06/02/2022 LOS: 3  Chief complaints Chief Complaint  Patient presents with   Shortness of Breath     Brief Narrative / Interim history: 81 year old M with PMH of combined CHF/ICM, CAD/CABG, COPD, CKD-3A, right lung cancer, HTN, osteoarthritis and anemia returning with shortness of breath, orthopnea and BLE edema and admitted for acute respiratory failure with hypoxia due to acute on chronic combined CHF, possible LLL pneumonia and COPD exacerbation.  BNP elevated to 1000 (higher than baseline).  CXR suggests LLL opacity.  Tested positive for COVID-19 as well.  Patient was started on IV Lasix, Decadron, ceftriaxone, azithromycin and Paxlovid.   Of note, recently hospitalized for COPD and CHF exacerbation 10/7-10/12.  Patient was continued on IV Lasix, Decadron, ceftriaxone, azithromycin and antiviral with improvement in his respiratory symptoms.  However, he developed respiratory distress with increased oxygen requirement the night of 10/25.  PCCM consulted and he was transferred to SDU.  CTA chest negative for PE but multifocal pneumonia concerning for aspiration pneumonia obstructive endobronchial material within RLL bronchus, near occlusive material in RML bronchus, dense consolidation of the RLL and LLL, patchy airspace disease in LUL and LLL and chronic right-sided pleural collection.  Venous Doppler negative for DVT but limited study.  IR thoracocentesis was not successful.  PCCM following.  Subjective: Seen and examined earlier this morning.  An episode of respiratory distress overnight.  His oxygen increased to 30 L / 90% FiO2.  Feels better this morning.  Does not feel short of breath.  He reports cough with some phlegm.  He denies hemoptysis.  Denies chest pain, nausea or vomiting.  Reportedly had I&O last night and about 750 cc urine came out.  Bladder scan this  morning showed 550 cc.  Objective: Vitals:   06/22/22 1000 06/22/22 1100 06/22/22 1200 06/22/22 1340  BP: (!) 125/53 (!) 127/56 (!) 124/51 (!) 138/36  Pulse: 94 96 95 (!) 101  Resp: 19 17 (!) 23 18  Temp:      TempSrc:      SpO2: 98% 96% 95% 91%  Weight:      Height:        Examination:  GENERAL: No apparent distress.  Nontoxic. HEENT: MMM.  Vision still intact.  Hard of hearing. NECK: Supple.  No apparent JVD.  RESP:  No IWOB.  Fair aeration bilaterally.  Diminished aeration in right lung. CVS:  RRR. Heart sounds normal.  ABD/GI/GU: BS+. Abd soft, NTND.  MSK/EXT:  Moves extremities. No apparent deformity.  TED hose in place. SKIN: no apparent skin lesion or wound NEURO: Awake and alert. Oriented appropriately.  No apparent focal neuro deficit. PSYCH: Calm. Normal affect.   Procedures:  None  Microbiology summarized: COVID-19 PCR positive.  Assessment and plan: Principal Problem:   Acute on chronic combined systolic and diastolic congestive heart failure (HCC) Active Problems:   HLD (hyperlipidemia)   Essential hypertension   GERD (gastroesophageal reflux disease)   CAD (coronary artery disease)   Thrombocytopenia (HCC)   Stage 3b chronic kidney disease (CKD) ([place])   Primary cancer of right upper lobe of lung (HCC)   Goals of care, counseling/discussion   Pleural effusion on right   Cardiomyopathy, ischemic   History of endovascular stent graft for abdominal aortic aneurysm (AAA)   Acute exacerbation of chronic obstructive pulmonary disease (COPD) ([place])   Acute respiratory failure with hypoxia (  [place])   Anemia of chronic kidney failure   COVID-19 virus infection   Aspiration pneumonia (HCC)   Acute urinary retention  Acute respiratory failure with hypoxia and hypercarbia: Multifactorial including aspiration pneumonia, pleural effusion, acute CHF, COPD exacerbation, COVID-19 infection and CHF.  CTA chest negative for PE but multifocal pneumonia raising concern for  aspiration, RLL and RML bronchial obstruction, chronic right pleural effusion.  Increased oxygen requirement.  IR thoracocentesis not successful.  Currently on 30 L 90% FiO2.  No significant respiratory distress.  He is full code. -PCCM following. -Continue BiPAP as needed and at night if he tolerates -Completed 4 days of CTX and Zithromax.  Unasyn for 5 more days -Continue IV Solu-Medrol 80 mg twice daily -Continue scheduled and as needed nebs -Aspiration precaution -Consult SLP -Incentive spirometry, PT/OT  Acute on chronic combined CHF: Presents with SOB, orthopnea and BLE edema.  BNP evaded to 900s.  JVD, BNP and edema improving.  TTE in 03/2022 with LVEF of 30 to 35%, GH, G1-DD and mildly reduced RVSF.  Patient reports compliance with Farxiga, Lasix, diet and salt intake.  I and O incomplete but net -2.3 L.  Cr improved.  BP stable.  -Continue IV Lasix 40 mg daily -Continue holding Entresto and Aldactone -Changed Coreg to Toprol-XL.  Beta beta-1 selectivity and less effect on BP. -Strict intake and output, renal functions and electrolytes -Continue TED hose and leg elevation  Acute exacerbation of chronic obstructive pulmonary disease: Could be in the setting of COVID-19 infection and possible pneumonia. -Continue change Decadron to IV Solu-Medrol. -Continue antibiotics as above -Agree with changing inhalers to nebulizers -May consider changing Coreg to Toprol-XL for better beta-1 selectivity -BiPAP as needed  COVID-19 infection: Fully vaccinated.  Elevated inflammatory markers.  Pro-Cal 0.19.  CRP improved but the D-dimer trended up. -Management as above -Continue isolation precaution  History of CAD s/p CABG in 1996 and PCI to dLM and SVG-D in 02/2014: Mildly elevated troponin.  No chest pain. -Continue home meds-Coreg, Plavix and Crestor -Outpatient follow-up with cardiology  CKD-3B/hyponatremia: Stable. Recent Labs    06/03/22 0521 06/04/22 0935 06/05/22 0355  06/06/22 0339 06/07/22 0405 06/02/2022 4098 06/19/22 0410 06/20/22 0408 06/21/22 0410 06/22/22 0255  BUN 26* 41* 46* 51* 44* 25* 29* 35* 40* 36*  CREATININE 1.71* 1.86* 1.72* 1.85* 1.73* 1.76* 1.83* 1.79* 1.78* 1.55*  -Hold Entresto and Aldactone.  Essential hypertension: soft BP this morning.  Now symptomatic. -Cardiac meds as above  Acute urinary retention: Reportedly had 750 cc on I&O cath the night of 10/26.  The scan showed 550cc on 10/27.  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 -Foley catheter -Start Flomax  GERD Continue PPI.  Thrombocytopenia/normocytic anemia: Stable. -Monitor    Hyperlipidemia -Continue home Crestor  Physical deconditioning -PT/OT eval  Goal of care counseling: Patient's son thinks patient has a living will with DNR.  However, patient wants to be full code.  Patient's wife and son in agreement. -Palliative care consulted  Body mass index is 24.97 kg/m.           DVT prophylaxis:  Place TED hose Start: 06/19/22 1130 SCDs Start: 06/09/2022 1016 due to thrombocytopenia  Code Status: Full code Family Communication: Updated patient's son over the phone. Level of care: Stepdown Status is: Inpatient Remains inpatient appropriate because: Acute respiratory failure with hypoxia and hypercapnia, CHF exacerbation, COPD exacerbation, COVID-19 infection and possible pneumonia   Final disposition: TBD Consultants:  Pulmonology  Sch Meds:  Scheduled Meds:  arformoterol  15 mcg Nebulization BID   budesonide (PULMICORT) nebulizer solution  0.25 mg Nebulization BID   Chlorhexidine Gluconate Cloth  6 each Topical Daily   clopidogrel  75 mg Oral Daily   dapagliflozin propanediol  10 mg Oral QAC breakfast   furosemide  40 mg Intravenous Daily   ipratropium-albuterol  3 mL Nebulization TID   methylPREDNISolone (SOLU-MEDROL) injection  80 mg Intravenous Q12H   metoprolol succinate  50 mg Oral Daily   molnupiravir EUA  4 capsule Oral BID    pantoprazole  40 mg Oral Daily   [START ON 06/24/2022] rosuvastatin  20 mg Oral QHS   tamsulosin  0.4 mg Oral QPC supper   Continuous Infusions:  azithromycin Stopped (06/22/22 0943)   PRN Meds:.acetaminophen **OR** acetaminophen, albuterol, menthol-cetylpyridinium, ondansetron **OR** ondansetron (ZOFRAN) IV, sodium chloride  Antimicrobials: Anti-infectives (From admission, onward)    Start     Dose/Rate Route Frequency Ordered Stop   06/19/22 2200  molnupiravir EUA (LAGEVRIO) capsule 800 mg        4 capsule Oral 2 times daily 06/19/22 1514 06/24/22 2159   06/19/22 1000  azithromycin (ZITHROMAX) 500 mg in sodium chloride 0.9 % 250 mL IVPB        500 mg 250 mL/hr over 60 Minutes Intravenous Every 24 hours 06/22/2022 1931 06/24/22 0959   06/19/22 1000  cefTRIAXone (ROCEPHIN) 1 g in sodium chloride 0.9 % 100 mL IVPB        1 g 200 mL/hr over 30 Minutes Intravenous Every 24 hours 06/07/2022 2120 06/22/22 1026   06/02/2022 2200  nirmatrelvir/ritonavir EUA (renal dosing) (PAXLOVID) 2 tablet  Status:  Discontinued       Note to Pharmacy: Please adjust or change treatment as needed.   2 tablet Oral 2 times daily 06/05/2022 1929 06/19/22 1514   05/28/2022 2030  cefTRIAXone (ROCEPHIN) 1 g in sodium chloride 0.9 % 100 mL IVPB  Status:  Discontinued        1 g 200 mL/hr over 30 Minutes Intravenous Every 24 hours 06/04/2022 1931 06/03/2022 2120   05/29/2022 1000  levofloxacin (LEVAQUIN) tablet 500 mg  Status:  Discontinued        500 mg Oral Daily 06/19/2022 0943 06/12/2022 1943        I have personally reviewed the following labs and images: CBC: Recent Labs  Lab 06/26/2022 0633 06/19/22 0410 06/20/22 0408 06/21/22 0410 06/22/22 0255  WBC 7.7 5.7 4.9 10.0 8.3  NEUTROABS 7.1 5.4 4.6 9.5* 7.9*  HGB 9.7* 9.9* 9.9* 10.8* 9.9*  HCT 31.1* 32.0* 31.9* 36.1* 31.5*  MCV 86.9 86.0 85.5 87.8 85.6  PLT 71* 68* 58* 73* 55*   BMP &GFR Recent Labs  Lab 06/04/2022 0633 06/19/22 0410 06/20/22 0408 06/21/22 0410  06/22/22 0255  NA 131* 130* 131* 134* 136  K 3.5 3.8 3.8 3.7 3.4*  CL 95* 92* 91* 92* 93*  CO2 29 29 29  33* 33*  GLUCOSE 89 152* 141* 124* 137*  BUN 25* 29* 35* 40* 36*  CREATININE 1.76* 1.83* 1.79* 1.78* 1.55*  CALCIUM 7.8* 7.8* 8.0* 8.1* 7.6*  MG 1.9 2.1 2.2 2.6* 2.2  PHOS  --  4.9* 4.2 4.1 3.2   Estimated Creatinine Clearance: 37.9 mL/min (A) (by C-G formula based on SCr of 1.55 mg/dL (H)). Liver & Pancreas: Recent Labs  Lab 06/19/22 0410 06/20/22 0408 06/21/22 0410 06/22/22 0255  AST 23 19 24 19   ALT 19 18 22 17   ALKPHOS 65 61 77 61  BILITOT  0.6 0.5 0.3 0.7  PROT 6.2* 6.1* 6.9 5.9*  ALBUMIN 2.3* 2.2* 2.5* 2.2*   No results for input(s): "LIPASE", "AMYLASE" in the last 168 hours. No results for input(s): "AMMONIA" in the last 168 hours. Diabetic: No results for input(s): "HGBA1C" in the last 72 hours. Recent Labs  Lab 06/21/22 1643 06/21/22 2114 06/22/22 0730 06/22/22 1145 06/22/22 1651  GLUCAP 131* 135* 143* 131* 173*   Cardiac Enzymes: Recent Labs  Lab 06/19/22 2210 06/20/22 0408  CKTOTAL 21* 20*   No results for input(s): "PROBNP" in the last 8760 hours. Coagulation Profile: No results for input(s): "INR", "PROTIME" in the last 168 hours. Thyroid Function Tests: No results for input(s): "TSH", "T4TOTAL", "FREET4", "T3FREE", "THYROIDAB" in the last 72 hours. Lipid Profile: No results for input(s): "CHOL", "HDL", "LDLCALC", "TRIG", "CHOLHDL", "LDLDIRECT" in the last 72 hours. Anemia Panel: Recent Labs    06/20/22 0408  VITAMINB12 667  FOLATE 8.3  FERRITIN 2,541*  TIBC 125*  IRON 37*  RETICCTPCT 0.5   Urine analysis:    Component Value Date/Time   COLORURINE YELLOW 06/03/2022 0307   APPEARANCEUR CLEAR 06/03/2022 0307   LABSPEC 1.013 06/03/2022 0307   LABSPEC 1.010 03/28/2016 1244   PHURINE 5.0 06/03/2022 0307   GLUCOSEU >=500 (A) 06/03/2022 0307   GLUCOSEU Negative 03/28/2016 1244   HGBUR NEGATIVE 06/03/2022 0307   BILIRUBINUR NEGATIVE  06/03/2022 0307   BILIRUBINUR Negative 03/28/2016 [place] 06/03/2022 0307   PROTEINUR 30 (A) 06/03/2022 0307   UROBILINOGEN 0.2 03/28/2016 1244   NITRITE NEGATIVE 06/03/2022 0307   LEUKOCYTESUR NEGATIVE 06/03/2022 0307   LEUKOCYTESUR Trace 03/28/2016 1244   Sepsis Labs: Invalid input(s): "PROCALCITONIN", "LACTICIDVEN"  Microbiology: Recent Results (from the past 240 hour(s))  SARS Coronavirus 2 by RT PCR (hospital order, performed in [facility] hospital lab) *cepheid single result test* Anterior Nasal Swab     Status: Abnormal   Collection Time: 06/04/2022  5:34 PM   Specimen: Anterior Nasal Swab  Result Value Ref Range Status   SARS Coronavirus 2 by RT PCR POSITIVE (A) NEGATIVE Final    Comment: (NOTE) SARS-CoV-2 target nucleic acids are DETECTED  SARS-CoV-2 RNA is generally detectable in upper respiratory specimens  during the acute phase of infection.  Positive results are indicative  of the presence of the identified virus, but do not rule out bacterial infection or co-infection with other pathogens not detected by the test.  Clinical correlation with patient history and  other diagnostic information is necessary to determine patient infection status.  The expected result is negative.  Fact Sheet for Patients:   https://www.patel.info/   Fact Sheet for Healthcare Providers:   https://hall.com/    This test is not yet approved or cleared by the [place] FDA and  has been authorized for detection and/or diagnosis of SARS-CoV-2 by FDA under an Emergency Use Authorization (EUA).  This EUA will remain in effect (meaning this test can be used) for the duration of  the COVID-19 declaration under Section 564(b)(1)  of the Act, 21 U.S.C. section 360-bbb-3(b)(1), unless the authorization is terminated or revoked sooner.   Performed at [facility], [place] [address], [place], [place] [address]    Expectorated Sputum Assessment w Gram Stain, Rflx to Resp Cult     Status: None   Collection Time: 06/19/22  3:34 PM   Specimen: Expectorated Sputum  Result Value Ref Range Status   Specimen Description EXPECTORATED SPUTUM  Final   Special Requests NONE  Final   Sputum evaluation   Final    Sputum specimen not acceptable for testing.  Please recollect.   NOTIFIED SEZER,N RN AT 2103 ON 06/19/22 BY VAZQUEZJ Performed at [facility], [place] [address], [place], [place] [address]    Report Status 06/20/2022 FINAL  Final  Expectorated Sputum Assessment w Gram Stain, Rflx to Resp Cult     Status: None   Collection Time: 06/20/22 12:11 PM  Result Value Ref Range Status   Specimen Description EXPECTORATED SPUTUM  Final   Special Requests NONE  Final   Sputum evaluation   Final    Sputum specimen not acceptable for testing.  Please recollect.   Marijean Bravo RN @1412  ON 06/20/2022 BY Gloriann Loan Performed at [facility], [place] [address] [place], [place] [address]    Report Status 06/20/2022 FINAL  Final  MRSA Next Gen by PCR, Nasal     Status: None   Collection Time: 06/21/22  1:28 PM   Specimen: Nasal Mucosa; Nasal Swab  Result Value Ref Range Status   MRSA by PCR Next Gen NOT DETECTED NOT DETECTED Final    Comment: (NOTE) The GeneXpert MRSA Assay (FDA approved for NASAL specimens only), is one component of a comprehensive MRSA colonization surveillance program. It is not intended to diagnose MRSA infection nor to guide or monitor treatment for MRSA infections. Test performance is not FDA approved in patients less than 90 years old. Performed at [facility] [place], [place] [address], [place], [place] [address]     Radiology Studies: No results found.    Johney Perotti T. Wilson  If 7PM-7AM, please contact night-coverage [email or web address] 06/22/2022, 5:08 PM

## 2022-06-22 NOTE — Progress Notes (Signed)
Rhythm change noted on ECG. EKG obtained and NP notified. Pt stating he can't breathe. Respiratory at bedside and increased HFNC to 30L 90% fio2.

## 2022-06-22 NOTE — Progress Notes (Signed)
Physical Therapy Treatment Patient Details [name]. MRN: [id number] DOB: March 21, 1941 Today's Date: 06/22/2022   History of Present Illness 81 year old M admitted 06/25/2022 with shortness of breath, orthopnea and BLE edema and admitted for acute respiratory failure with hypoxia due to acute on chronic combined CHF, possible LLL pneumonia and COPD exacerbation. CXR suggests LLL opacity.  + COVID-19.Of note, recently hospitalized for COPD and CHF exacerbation 10/7-10/12. PMH includes combined CHF/ICM, CAD/CABG, COPD, CKD-3A, right lung cancer, HTN, osteoarthritis and anemia.    PT Comments    Pt supine in bed agreeable to be seen having just received breathing treatment from RT. BP 118/53, HR 84, SpO2 96% on 30L HHF. Pt required min guard for bed mobility and for transfers. Completed EOB exercise program including sit to stands and marches, limited by length of HHF tubing and by pt's activity tolerance: required several rest breaks with pursed lip breathing to recover SpO2, ranged 88-93% during mobility. At end of session BP 124/63, HR100, SpO2 93%. Encouraged pt to continue productive coughing and ensured that pt's callbell and yonker suction were within reach. Updated discharge therapy recommendation to OPPT and equipment to RW. We will continue to follow acutely.     Recommendations for follow up therapy are one component of a multi-disciplinary discharge planning process, led by the attending physician.  Recommendations may be updated based on patient status, additional functional criteria and insurance authorization.  Follow Up Recommendations  Outpatient PT     Assistance Recommended at Discharge Set up Supervision/Assistance  Patient can return home with the following A little help with walking and/or transfers;A little help with bathing/dressing/bathroom;Assistance with cooking/housework;Assist for transportation;Help with stairs or ramp for entrance   Equipment  Recommendations  Rolling walker (2 wheels)    Recommendations for Other Services       Precautions / Restrictions Precautions Precaution Comments: monitor O2, COVID Restrictions Weight Bearing Restrictions: No     Mobility  Bed Mobility Overal bed mobility: Needs Assistance Bed Mobility: Supine to Sit, Sit to Supine     Supine to sit: Min guard, HOB elevated Sit to supine: Min guard, HOB elevated   General bed mobility comments: Min guard for safety, no physical assist required.    Transfers Overall transfer level: Needs assistance Equipment used: None Transfers: Sit to/from Stand Sit to Stand: Min guard, From elevated surface           General transfer comment: Min guard from elevated surface for safety, no physical assist required.    Ambulation/Gait               General Gait Details: Unsafe, pt on HHF   Stairs             Wheelchair Mobility    Modified Rankin (Stroke Patients Only)       Balance Overall balance assessment: Needs assistance Sitting-balance support: No upper extremity supported, Feet supported Sitting balance-Leahy Scale: Good     Standing balance support: No upper extremity supported, During functional activity Standing balance-Leahy Scale: Fair                              Cognition Arousal/Alertness: Awake/alert Behavior During Therapy: WFL for tasks assessed/performed Overall Cognitive Status: Within Functional Limits for tasks assessed  General Comments: Pt exhibited some "hoarsness" and very quiet speech but was intelligible and able to make himself understood        Exercises Other Exercises Other Exercises: Sit to stands at EOB: x5 allowing for rest breaks and for Spo2 to recover, pt utilizes BUE on bed to power up. Other Exercises: Standing marches 2x15 with seated rest break inbetween.    General Comments General comments (skin integrity,  edema, etc.): Pt on 30L HHF at 90% Fi02, SpO2 monitored throughout and maintained between 88-94% throughout mobility, pt able to employ pursed lip breathing technique.      Pertinent Vitals/Pain Pain Assessment Pain Assessment: No/denies pain    Home Living                          Prior Function            PT Goals (current goals can now be found in the care plan section) Acute Rehab PT Goals Patient Stated Goal: get better and home PT Goal Formulation: With patient Time For Goal Achievement: 07/04/22 Potential to Achieve Goals: Good Progress towards PT goals: Progressing toward goals    Frequency    Min 3X/week      PT Plan Discharge plan needs to be updated    Co-evaluation              AM-PAC PT "6 Clicks" Mobility   Outcome Measure  Help needed turning from your back to your side while in a flat bed without using bedrails?: A Little Help needed moving from lying on your back to sitting on the side of a flat bed without using bedrails?: A Little Help needed moving to and from a bed to a chair (including a wheelchair)?: A Little Help needed standing up from a chair using your arms (e.g., wheelchair or bedside chair)?: A Little Help needed to walk in hospital room?: A Little Help needed climbing 3-5 steps with a railing? : A Little 6 Click Score: 18    End of Session Equipment Utilized During Treatment: Gait belt;Oxygen Activity Tolerance: Patient tolerated treatment well;No increased pain Patient left: with call bell/phone within reach;in bed;with bed alarm set Nurse Communication: Mobility status PT Visit Diagnosis: Muscle weakness (generalized) (M62.81);Other abnormalities of gait and mobility (R26.89);Difficulty in walking, not elsewhere classified (R26.2)     Time: 6384-5364 PT Time Calculation (min) (ACUTE ONLY): 30 min  Charges:  $Therapeutic Exercise: 23-37 mins                     Coolidge Breeze, PT, DPT [facility]  Department Office: [phone] Weekend pager: [phone]   Coolidge Breeze 06/22/2022, 2:43 PM

## 2022-06-22 NOTE — Progress Notes (Signed)
PHARMACY NOTE -  Unasyn  Pharmacy has been assisting with dosing of Unasyn for aspiration. Dosage remains stable at 3g IV q6 hr and further renal adjustments per institutional Pharmacy antibiotic protocol  Pharmacy will sign off, following peripherally for culture results or dose adjustments. Please reconsult if a change in clinical status warrants re-evaluation of dosage.  Reuel Boom, PharmD, BCPS [phone] 06/22/2022, 5:43 PM

## 2022-06-22 NOTE — Progress Notes (Addendum)
[facility] Progress Note Patient [name]. DOB: June 03, 1941 MRN: [id number]   Date of Service  06/22/2022  HPI/Events of Note  Notified Trop 193, suspect demand ischemia 48 M with CHF/COPD/COVID  During day FIO2 improved from 90% to 70%  eICU Interventions  F/u repeat trop>down trending     Intervention Category Minor Interventions: Other:  Yarixa Lightcap Rodman Pickle 06/22/2022, 9:00 PM

## 2022-06-23 ENCOUNTER — Inpatient Hospital Stay (HOSPITAL_COMMUNITY): Payer: Medicare Other

## 2022-06-23 DIAGNOSIS — D696 Thrombocytopenia, unspecified: Secondary | ICD-10-CM | POA: Diagnosis not present

## 2022-06-23 DIAGNOSIS — N1832 Chronic kidney disease, stage 3b: Secondary | ICD-10-CM | POA: Diagnosis not present

## 2022-06-23 DIAGNOSIS — I5043 Acute on chronic combined systolic (congestive) and diastolic (congestive) heart failure: Secondary | ICD-10-CM | POA: Diagnosis not present

## 2022-06-23 DIAGNOSIS — J441 Chronic obstructive pulmonary disease with (acute) exacerbation: Secondary | ICD-10-CM | POA: Diagnosis not present

## 2022-06-23 LAB — COMPREHENSIVE METABOLIC PANEL
ALT: 19 U/L (ref 0–44)
AST: 20 U/L (ref 15–41)
Albumin: 2.3 g/dL — ABNORMAL LOW (ref 3.5–5.0)
Alkaline Phosphatase: 66 U/L (ref 38–126)
Anion gap: 9 (ref 5–15)
BUN: 41 mg/dL — ABNORMAL HIGH (ref 8–23)
CO2: 36 mmol/L — ABNORMAL HIGH (ref 22–32)
Calcium: 8.1 mg/dL — ABNORMAL LOW (ref 8.9–10.3)
Chloride: 96 mmol/L — ABNORMAL LOW (ref 98–111)
Creatinine, Ser: 1.56 mg/dL — ABNORMAL HIGH (ref 0.61–1.24)
GFR, Estimated: 44 mL/min — ABNORMAL LOW (ref >60)
Glucose, Bld: 144 mg/dL — ABNORMAL HIGH (ref 70–99)
Potassium: 3.8 mmol/L (ref 3.5–5.1)
Sodium: 141 mmol/L (ref 135–145)
Total Bilirubin: 0.7 mg/dL (ref 0.3–1.2)
Total Protein: 6.3 g/dL — ABNORMAL LOW (ref 6.5–8.1)

## 2022-06-23 LAB — GLUCOSE, CAPILLARY
Glucose-Capillary: 167 mg/dL — ABNORMAL HIGH (ref 70–99)
Glucose-Capillary: 193 mg/dL — ABNORMAL HIGH (ref 70–99)
Glucose-Capillary: 168 mg/dL — ABNORMAL HIGH (ref 70–99)
Glucose-Capillary: 188 mg/dL — ABNORMAL HIGH (ref 70–99)

## 2022-06-23 LAB — CBC WITH DIFFERENTIAL/PLATELET
Abs Immature Granulocytes: 0.08 10*3/uL — ABNORMAL HIGH (ref 0.00–0.07)
Basophils Absolute: 0 10*3/uL (ref 0.0–0.1)
Basophils Relative: 0 %
Eosinophils Absolute: 0 10*3/uL (ref 0.0–0.5)
Eosinophils Relative: 0 %
HCT: 32.8 % — ABNORMAL LOW (ref 39.0–52.0)
Hemoglobin: 10 g/dL — ABNORMAL LOW (ref 13.0–17.0)
Immature Granulocytes: 1 %
Lymphocytes Relative: 2 %
Lymphs Abs: 0.1 10*3/uL — ABNORMAL LOW (ref 0.7–4.0)
MCH: 26.3 pg (ref 26.0–34.0)
MCHC: 30.5 g/dL (ref 30.0–36.0)
MCV: 86.3 fL (ref 80.0–100.0)
Monocytes Absolute: 0.1 10*3/uL (ref 0.1–1.0)
Monocytes Relative: 1 %
Neutro Abs: 9.2 10*3/uL — ABNORMAL HIGH (ref 1.7–7.7)
Neutrophils Relative %: 96 %
Platelets: 50 10*3/uL — ABNORMAL LOW (ref 150–400)
RBC: 3.8 MIL/uL — ABNORMAL LOW (ref 4.22–5.81)
RDW: 17 % — ABNORMAL HIGH (ref 11.5–15.5)
WBC: 9.5 10*3/uL (ref 4.0–10.5)
nRBC: 0 % (ref 0.0–0.2)

## 2022-06-23 LAB — C-REACTIVE PROTEIN: CRP: 2.8 mg/dL — ABNORMAL HIGH (ref <1.0)

## 2022-06-23 LAB — D-DIMER, QUANTITATIVE: D-Dimer, Quant: 8.51 ug/mL-FEU — ABNORMAL HIGH (ref 0.00–0.50)

## 2022-06-23 LAB — PHOSPHORUS: Phosphorus: 2.7 mg/dL (ref 2.5–4.6)

## 2022-06-23 LAB — MAGNESIUM: Magnesium: 2.5 mg/dL — ABNORMAL HIGH (ref 1.7–2.4)

## 2022-06-23 MED ORDER — METOPROLOL SUCCINATE ER 25 MG PO TB24
75.0000 mg | ORAL_TABLET | Freq: Every day | ORAL | Status: DC
  Administered 2022-06-23 – 2022-06-27 (×5): 75 mg via ORAL
  Filled 2022-06-23 (×5): qty 3

## 2022-06-23 NOTE — Progress Notes (Signed)
PROGRESS NOTE  Paul Sandoval. KGU:[id number] DOB: May 27, 1941   PCP: Coral Spikes, DO  Patient is from: Home.  DOA: 06/14/2022 LOS: 4  Chief complaints Chief Complaint  Patient presents with   Shortness of Breath     Brief Narrative / Interim history: 81 year old M with PMH of combined CHF/ICM, CAD/CABG, COPD, CKD-3A, right lung cancer, HTN, osteoarthritis and anemia returning with shortness of breath, orthopnea and BLE edema and admitted for acute respiratory failure with hypoxia due to acute on chronic combined CHF, possible LLL pneumonia and COPD exacerbation.  BNP elevated to 1000 (higher than baseline).  CXR suggests LLL opacity.  Tested positive for COVID-19 as well.  Patient was started on IV Lasix, Decadron, ceftriaxone, azithromycin and Paxlovid.   Of note, recently hospitalized for COPD and CHF exacerbation 10/7-10/12.  Patient was continued on IV Lasix, Decadron, ceftriaxone, azithromycin and antiviral with improvement in his respiratory symptoms.  However, he developed respiratory distress with increased oxygen requirement the night of 10/25.  PCCM consulted and he was transferred to SDU.  CTA chest negative for PE but multifocal pneumonia concerning for aspiration pneumonia obstructive endobronchial material within RLL bronchus, near occlusive material in RML bronchus, dense consolidation of the RLL and LLL, patchy airspace disease in LUL and LLL and chronic right-sided pleural collection.  Venous Doppler negative for DVT but limited study.  IR thoracocentesis was not successful.  PCCM following.  Subjective: Seen and examined earlier this morning.  No major events overnight of this morning.  He just cleaned up and sat on the bedside chair.  He has no complaints.  He denies shortness of breath or chest pain.   Objective: Vitals:   06/23/22 1000 06/23/22 1100 06/23/22 1200 06/23/22 1300  BP: (!) 125/39 (!) 126/59 138/63 139/74  Pulse: 86 (!) 110 (!) 101 99  Resp: 20  (!) 28 (!) 30 (!) 24  Temp:   (!) 97.4 F (36.3 C)   TempSrc:   Oral   SpO2: 92% 91% 95% 94%  Weight:      Height:        Examination:  GENERAL: No apparent distress.  Nontoxic. HEENT: MMM.  Vision grossly intact.  Diminished hearing. NECK: Supple.  No apparent JVD.  RESP:  No IWOB.  Fair aeration bilaterally.  Diminished aeration in right lung.  Crackles bilaterally. CVS:  RRR. Heart sounds normal.  ABD/GI/GU: BS+. Abd soft, NTND.  Foley in place. MSK/EXT:  Moves extremities. No apparent deformity. No edema.  SKIN: no apparent skin lesion or wound NEURO: Awake and alert. Oriented appropriately.  No apparent focal neuro deficit. PSYCH: Calm. Normal affect.   Procedures:  None  Microbiology summarized: COVID-19 PCR positive.  Assessment and plan: Principal Problem:   Acute on chronic combined systolic and diastolic congestive heart failure (HCC) Active Problems:   HLD (hyperlipidemia)   Essential hypertension   GERD (gastroesophageal reflux disease)   CAD (coronary artery disease)   Thrombocytopenia (HCC)   Stage 3b chronic kidney disease (CKD) ([place])   Primary cancer of right upper lobe of lung (HCC)   Goals of care, counseling/discussion   Pleural effusion on right   Cardiomyopathy, ischemic   History of endovascular stent graft for abdominal aortic aneurysm (AAA)   Acute exacerbation of chronic obstructive pulmonary disease (COPD) ([place])   Acute respiratory failure with hypoxia (HCC)   Anemia of chronic kidney failure   COVID-19 virus infection   Aspiration pneumonia ([place])   Acute urinary retention  Acute respiratory  failure with hypoxia and hypercarbia: Multifactorial including aspiration multifocal pneumonia, pleural effusion, acute CHF, COPD exacerbation, COVID-19 infection and CHF.  CTA chest negative for PE but multifocal pneumonia raising concern for aspiration, RLL and RML bronchial obstruction, chronic right pleural effusion.  Increased oxygen requirement.   IR thoracocentesis not successful.  No significant respiratory distress.  Oxygen requirement improving.  He is full code. -PCCM following. -Continue BiPAP as needed and at night if he tolerates -CTX and Zithromax 10/23 to 10/27 >>>Unasyn 10/27>>> for 5 more days -Continue IV Solu-Medrol 80 mg twice daily -Continue scheduled and as needed nebs -Aspiration precaution -Appreciate evaluation and help by SLP -Incentive spirometry, OOB, PT/OT  Acute on chronic combined CHF: Presents with SOB, orthopnea and BLE edema.  BNP evaded to 900s.  JVD, BNP and edema improving.  TTE in 03/2022 with LVEF of 30 to 35%, GH, G1-DD and mildly reduced RVSF.  Patient reports compliance with Farxiga, Lasix, diet and salt intake.  I and O incomplete but net -2.3 L.  Cr improved.  BP stable.  -Continue IV Lasix 40 mg daily -Continue holding Entresto and Aldactone -Increase Toprol-XL to 75 mg daily -Strict intake and output, renal functions and electrolytes -Continue TED hose and leg elevation  Acute exacerbation of chronic obstructive pulmonary disease: Could be in the setting of COVID-19 infection and possible pneumonia. -Continue IV Solu-Medrol, antibiotics and nebulizers -BiPAP as needed  COVID-19 infection: Fully vaccinated.  Elevated inflammatory markers.  Pro-Cal 0.19.  CRP improved but the D-dimer trended up. -Management as above -Continue isolation precaution  History of CAD s/p CABG in 1996 and PCI to dLM and SVG-D in 02/2014: Mildly elevated troponin.  No chest pain. -Continue home meds-Coreg, Plavix and Crestor -Outpatient follow-up with cardiology  CKD-3B/hyponatremia: Stable. Recent Labs    06/04/22 0935 06/05/22 0355 06/06/22 0339 06/07/22 0405 06/08/2022 2836 06/19/22 0410 06/20/22 0408 06/21/22 0410 06/22/22 0255 06/23/22 0252  BUN 41* 46* 51* 44* 25* 29* 35* 40* 36* 41*  CREATININE 1.86* 1.72* 1.85* 1.73* 1.76* 1.83* 1.79* 1.78* 1.55* 1.56*  -Continue holding Entresto and  Aldactone.  Essential hypertension: soft BP this morning.  Now symptomatic. -Cardiac meds as above  Acute urinary retention: Reportedly had 750 cc on I&O cath the night of 10/26.  The scan showed 550cc on 10/27.  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 -Continue Foley catheter -Continue Flomax  GERD Continue PPI.  Thrombocytopenia/normocytic anemia: Stable. -Monitor    Hyperlipidemia -Continue home Crestor  Physical deconditioning -PT/OT eval  Goal of care counseling: Patient's son thinks patient has a living will with DNR.  However, patient wants to be full code.  Patient's wife and son in agreement.  Body mass index is 24.97 kg/m.           DVT prophylaxis:  Place TED hose Start: 06/19/22 1130 SCDs Start: 06/25/2022 1016 due to thrombocytopenia  Code Status: Full code Family Communication: Made patient's son in the waiting room and updated  Level of care: Stepdown Status is: Inpatient Remains inpatient appropriate because: Acute respiratory failure with hypoxia and hypercapnia, CHF exacerbation, COPD exacerbation, COVID-19 infection and possible pneumonia   Final disposition: TBD Consultants:  Pulmonology  Sch Meds:  Scheduled Meds:  arformoterol  15 mcg Nebulization BID   budesonide (PULMICORT) nebulizer solution  0.25 mg Nebulization BID   Chlorhexidine Gluconate Cloth  6 each Topical Daily   clopidogrel  75 mg Oral Daily   dapagliflozin propanediol  10 mg Oral QAC breakfast  furosemide  40 mg Intravenous Daily   methylPREDNISolone (SOLU-MEDROL) injection  80 mg Intravenous Q12H   metoprolol succinate  75 mg Oral Daily   molnupiravir EUA  4 capsule Oral BID   pantoprazole  40 mg Oral Daily   revefenacin  175 mcg Nebulization Daily   [START ON 06/24/2022] rosuvastatin  20 mg Oral QHS   tamsulosin  0.4 mg Oral QPC supper   Continuous Infusions:  ampicillin-sulbactam (UNASYN) IV 3 g (06/23/22 1240)   PRN Meds:.acetaminophen **OR** acetaminophen,  levalbuterol **AND** ipratropium, menthol-cetylpyridinium, ondansetron **OR** ondansetron (ZOFRAN) IV, sodium chloride  Antimicrobials: Anti-infectives (From admission, onward)    Start     Dose/Rate Route Frequency Ordered Stop   06/23/22 0600  Ampicillin-Sulbactam (UNASYN) 3 g in sodium chloride 0.9 % 100 mL IVPB        3 g 200 mL/hr over 30 Minutes Intravenous Every 6 hours 06/22/22 1739 06/27/22 2359   06/19/22 2200  molnupiravir EUA (LAGEVRIO) capsule 800 mg        4 capsule Oral 2 times daily 06/19/22 1514 06/24/22 2159   06/19/22 1000  azithromycin (ZITHROMAX) 500 mg in sodium chloride 0.9 % 250 mL IVPB  Status:  Discontinued        500 mg 250 mL/hr over 60 Minutes Intravenous Every 24 hours 06/07/2022 1931 06/22/22 1711   06/19/22 1000  cefTRIAXone (ROCEPHIN) 1 g in sodium chloride 0.9 % 100 mL IVPB        1 g 200 mL/hr over 30 Minutes Intravenous Every 24 hours 06/12/2022 2120 06/22/22 1026   06/03/2022 2200  nirmatrelvir/ritonavir EUA (renal dosing) (PAXLOVID) 2 tablet  Status:  Discontinued       Note to Pharmacy: Please adjust or change treatment as needed.   2 tablet Oral 2 times daily 06/14/2022 1929 06/19/22 1514   06/20/2022 2030  cefTRIAXone (ROCEPHIN) 1 g in sodium chloride 0.9 % 100 mL IVPB  Status:  Discontinued        1 g 200 mL/hr over 30 Minutes Intravenous Every 24 hours 06/11/2022 1931 06/11/2022 2120   06/05/2022 1000  levofloxacin (LEVAQUIN) tablet 500 mg  Status:  Discontinued        500 mg Oral Daily 06/11/2022 0943 06/10/2022 1943        I have personally reviewed the following labs and images: CBC: Recent Labs  Lab 06/19/22 0410 06/20/22 0408 06/21/22 0410 06/22/22 0255 06/23/22 0252  WBC 5.7 4.9 10.0 8.3 9.5  NEUTROABS 5.4 4.6 9.5* 7.9* 9.2*  HGB 9.9* 9.9* 10.8* 9.9* 10.0*  HCT 32.0* 31.9* 36.1* 31.5* 32.8*  MCV 86.0 85.5 87.8 85.6 86.3  PLT 68* 58* 73* 55* 50*   BMP &GFR Recent Labs  Lab 06/19/22 0410 06/20/22 0408 06/21/22 0410 06/22/22 0255  06/23/22 0252  NA 130* 131* 134* 136 141  K 3.8 3.8 3.7 3.4* 3.8  CL 92* 91* 92* 93* 96*  CO2 29 29 33* 33* 36*  GLUCOSE 152* 141* 124* 137* 144*  BUN 29* 35* 40* 36* 41*  CREATININE 1.83* 1.79* 1.78* 1.55* 1.56*  CALCIUM 7.8* 8.0* 8.1* 7.6* 8.1*  MG 2.1 2.2 2.6* 2.2 2.5*  PHOS 4.9* 4.2 4.1 3.2 2.7   Estimated Creatinine Clearance: 37.7 mL/min (A) (by C-G formula based on SCr of 1.56 mg/dL (H)). Liver & Pancreas: Recent Labs  Lab 06/19/22 0410 06/20/22 0408 06/21/22 0410 06/22/22 0255 06/23/22 0252  AST 23 19 24 19 20   ALT 19 18 22 17 19   ALKPHOS 65 61 77  61 66  BILITOT 0.6 0.5 0.3 0.7 0.7  PROT 6.2* 6.1* 6.9 5.9* 6.3*  ALBUMIN 2.3* 2.2* 2.5* 2.2* 2.3*   No results for input(s): "LIPASE", "AMYLASE" in the last 168 hours. No results for input(s): "AMMONIA" in the last 168 hours. Diabetic: No results for input(s): "HGBA1C" in the last 72 hours. Recent Labs  Lab 06/22/22 0730 06/22/22 1145 06/22/22 1651 06/23/22 0804 06/23/22 1253  GLUCAP 143* 131* 173* 167* 193*   Cardiac Enzymes: Recent Labs  Lab 06/19/22 2210 06/20/22 0408  CKTOTAL 21* 20*   No results for input(s): "PROBNP" in the last 8760 hours. Coagulation Profile: No results for input(s): "INR", "PROTIME" in the last 168 hours. Thyroid Function Tests: No results for input(s): "TSH", "T4TOTAL", "FREET4", "T3FREE", "THYROIDAB" in the last 72 hours. Lipid Profile: No results for input(s): "CHOL", "HDL", "LDLCALC", "TRIG", "CHOLHDL", "LDLDIRECT" in the last 72 hours. Anemia Panel: No results for input(s): "VITAMINB12", "FOLATE", "FERRITIN", "TIBC", "IRON", "RETICCTPCT" in the last 72 hours.  Urine analysis:    Component Value Date/Time   COLORURINE YELLOW 06/03/2022 0307   APPEARANCEUR CLEAR 06/03/2022 0307   LABSPEC 1.013 06/03/2022 0307   LABSPEC 1.010 03/28/2016 1244   PHURINE 5.0 06/03/2022 0307   GLUCOSEU >=500 (A) 06/03/2022 0307   GLUCOSEU Negative 03/28/2016 1244   HGBUR NEGATIVE  06/03/2022 0307   BILIRUBINUR NEGATIVE 06/03/2022 0307   BILIRUBINUR Negative 03/28/2016 Hundred 06/03/2022 0307   PROTEINUR 30 (A) 06/03/2022 0307   UROBILINOGEN 0.2 03/28/2016 1244   NITRITE NEGATIVE 06/03/2022 0307   LEUKOCYTESUR NEGATIVE 06/03/2022 0307   LEUKOCYTESUR Trace 03/28/2016 1244   Sepsis Labs: Invalid input(s): "PROCALCITONIN", "LACTICIDVEN"  Microbiology: Recent Results (from the past 240 hour(s))  SARS Coronavirus 2 by RT PCR (hospital order, performed in [facility] hospital lab) *cepheid single result test* Anterior Nasal Swab     Status: Abnormal   Collection Time: 06/03/2022  5:34 PM   Specimen: Anterior Nasal Swab  Result Value Ref Range Status   SARS Coronavirus 2 by RT PCR POSITIVE (A) NEGATIVE Final    Comment: (NOTE) SARS-CoV-2 target nucleic acids are DETECTED  SARS-CoV-2 RNA is generally detectable in upper respiratory specimens  during the acute phase of infection.  Positive results are indicative  of the presence of the identified virus, but do not rule out bacterial infection or co-infection with other pathogens not detected by the test.  Clinical correlation with patient history and  other diagnostic information is necessary to determine patient infection status.  The expected result is negative.  Fact Sheet for Patients:   https://www.patel.info/   Fact Sheet for Healthcare Providers:   https://hall.com/    This test is not yet approved or cleared by the [place] FDA and  has been authorized for detection and/or diagnosis of SARS-CoV-2 by FDA under an Emergency Use Authorization (EUA).  This EUA will remain in effect (meaning this test can be used) for the duration of  the COVID-19 declaration under Section 564(b)(1)  of the Act, 21 U.S.C. section 360-bbb-3(b)(1), unless the authorization is terminated or revoked sooner.   Performed at [facility], [place]  [address], [place], [place] [address]   Expectorated Sputum Assessment w Gram Stain, Rflx to Resp Cult     Status: None   Collection Time: 06/19/22  3:34 PM   Specimen: Expectorated Sputum  Result Value Ref Range Status   Specimen Description EXPECTORATED SPUTUM  Final   Special Requests NONE  Final  Sputum evaluation   Final    Sputum specimen not acceptable for testing.  Please recollect.   NOTIFIED SEZER,N RN AT 2103 ON 06/19/22 BY VAZQUEZJ Performed at [facility], [place] [address] [place], [place] [address]    Report Status 06/20/2022 FINAL  Final  Expectorated Sputum Assessment w Gram Stain, Rflx to Resp Cult     Status: None   Collection Time: 06/20/22 12:11 PM  Result Value Ref Range Status   Specimen Description EXPECTORATED SPUTUM  Final   Special Requests NONE  Final   Sputum evaluation   Final    Sputum specimen not acceptable for testing.  Please recollect.   Vernell Barrier, J RN @1412  ON 06/20/2022 BY Gloriann Loan Performed at [facility], [place] [address] [place], [place] [address]    Report Status 06/20/2022 FINAL  Final  MRSA Next Gen by PCR, Nasal     Status: None   Collection Time: 06/21/22  1:28 PM   Specimen: Nasal Mucosa; Nasal Swab  Result Value Ref Range Status   MRSA by PCR Next Gen NOT DETECTED NOT DETECTED Final    Comment: (NOTE) The GeneXpert MRSA Assay (FDA approved for NASAL specimens only), is one component of a comprehensive MRSA colonization surveillance program. It is not intended to diagnose MRSA infection nor to guide or monitor treatment for MRSA infections. Test performance is not FDA approved in patients less than 34 years old. Performed at [facility], [place] [address], [place], [place] [address]     Radiology Studies: [facility] Chest Port 1 View  Result Date: 06/23/2022 CLINICAL DATA:  Acute respiratory failure.  Pleural effusion. EXAM: PORTABLE CHEST 1 VIEW COMPARISON:  June 21, 2022 FINDINGS:  Opacity in the left mid and lower lung is similar to slightly worsened in the interval. The right lung remains poorly aerated, similar to slightly worsened in the interval. Dense opacification of the right apex and right base remains. The cardiomediastinal silhouette is stable. No pneumothorax. No other interval changes. IMPRESSION: The bilateral pulmonary opacities are similar to slightly worsened in the interval. No other changes. Recommend continued attention on short-term follow-up imaging to ensure resolution of the opacities. Electronically Signed   By: Dorise Bullion III M.D.   On: 06/23/2022 08:19      Wasil Wolke T. [place]  If 7PM-7AM, please contact night-coverage [email or web address] 06/23/2022, 1:46 PM

## 2022-06-23 NOTE — Evaluation (Signed)
Clinical/Bedside Swallow Evaluation Patient Details  [name]. MRN: [id number] Date of Birth: 16-May-1941  Today's Date: 06/23/2022 Time: SLP Start Time (ACUTE ONLY): 0093 SLP Stop Time (ACUTE ONLY): 1018 SLP Time Calculation (min) (ACUTE ONLY): 23 min  Past Medical History:  Past Medical History:  Diagnosis Date   AAA (abdominal aortic aneurysm) ([place]) 2010   4.4 cm 08/2008;4.44 in 7/10 and 4.65 in 08/2009; 4.8 by CT in 11/2009; 4.3 by ultrasound in 08/2010   Anemia    Arteriosclerotic cardiovascular disease (ASCVD) 1996   CABG-1996   Arthritis    "fingers" (03/18/2014)   CAD (coronary artery disease)    03/18/14:  PCI with DES to distal left main. 7/29: DES to the SVG to Diag   Cancer [facility])    Upper right lobe lung cancer   Cardiomyopathy, ischemic    Echo 03/17/14: EF 45-50%   CHF (congestive heart failure) (HCC)    Chronic bronchitis (HCC)    Chronic kidney disease    CRF   Chronic rhinitis    Colonic polyp 2002   polypectomy in 2002   COPD (chronic obstructive pulmonary disease) ([place])    Diverticulosis    Dyspnea    with exertion   ED (erectile dysfunction)    Encounter for antineoplastic chemotherapy 12/19/2015   GERD (gastroesophageal reflux disease)    History of blood transfusion    Hyperlipidemia    Hypertension    IFG (impaired fasting glucose)    Myocardial infarction [facility])    "told h/o silent MI sometime before 1996"   Pneumonia ~ 2001; ~ 2005    has had more than twice   Right bundle branch block    Tobacco abuse, in remission    40 pack year total consumption; discontinued in 1996   Past Surgical History:  Past Surgical History:  Procedure Laterality Date   ABDOMINAL AORTIC ANEURYSM REPAIR  11/2012   ABDOMINAL AORTIC ENDOVASCULAR STENT GRAFT N/A 12/11/2012   Procedure: ABDOMINAL AORTIC ENDOVASCULAR STENT GRAFT;  Surgeon: Mal Misty, MD;  Location: [place];  Service: Vascular;  Laterality: N/A;  Ultrasound guided; [place]  01/08/1995   COLONOSCOPY  2002   polypectomy-patient denies   CORONARY ANGIOPLASTY WITH STENT PLACEMENT  03/18/2014   "1"   CORONARY ANGIOPLASTY WITH STENT PLACEMENT  03/24/2014   "1"   CORONARY ARTERY BYPASS GRAFT  01/09/1995   "CABG X3"   FLEXIBLE BRONCHOSCOPY N/A 03/01/2017   Procedure: FLEXIBLE BRONCHOSCOPY WITH BIOPSIES;  Surgeon: Gaye Pollack, MD;  Location: [place] OR;  Service: Thoracic;  Laterality: N/A;   JOINT REPLACEMENT     LAPAROSCOPIC CHOLECYSTECTOMY  12/2009   LEFT AND RIGHT HEART CATHETERIZATION WITH CORONARY/GRAFT ANGIOGRAM N/A 03/18/2014   Procedure: LEFT AND RIGHT HEART CATHETERIZATION WITH Beatrix Fetters;  Surgeon: Blane Ohara, MD;  Location: [facility] CATH LAB;  Service: Cardiovascular;  Laterality: N/A;   PERCUTANEOUS CORONARY STENT INTERVENTION (PCI-S)  03/18/2014   Procedure: PERCUTANEOUS CORONARY STENT INTERVENTION (PCI-S);  Surgeon: Blane Ohara, MD;  Location: [facility] CATH LAB;  Service: Cardiovascular;;   PERCUTANEOUS CORONARY STENT INTERVENTION (PCI-S) N/A 03/24/2014   Procedure: PERCUTANEOUS CORONARY STENT INTERVENTION (PCI-S);  Surgeon: Blane Ohara, MD;  Location: [facility] CATH LAB;  Service: Cardiovascular;  Laterality: N/A;   PLEURAL EFFUSION DRAINAGE Right 03/01/2017   Procedure: DRAINAGE OF PLEURAL EFFUSION;  Surgeon: Gaye Pollack, MD;  Location: [facility] OR;  Service: Thoracic;  Laterality: Right;   RIGHT/LEFT HEART CATH AND CORONARY/GRAFT  ANGIOGRAPHY N/A 05/15/2017   Procedure: RIGHT/LEFT HEART CATH AND CORONARY/GRAFT ANGIOGRAPHY;  Surgeon: Larey Dresser, MD;  Location: [place] CV LAB;  Service: Cardiovascular;  Laterality: N/A;   TALC PLEURODESIS Right 03/01/2017   Procedure: Pietro Cassis;  Surgeon: Gaye Pollack, MD;  Location: [place];  Service: Thoracic;  Laterality: Right;   TOTAL HIP ARTHROPLASTY Left 01/21/2013   Procedure: TOTAL HIP ARTHROPLASTY ANTERIOR APPROACH;  Surgeon: Mauri Pole, MD;  Location: [place];  Service:  Orthopedics;  Laterality: Left;   VIDEO ASSISTED THORACOSCOPY Right 03/01/2017   Procedure: VIDEO ASSISTED THORACOSCOPY WITH BIOPSIES;  Surgeon: Gaye Pollack, MD;  Location: [place];  Service: Thoracic;  Laterality: Right;   VIDEO BRONCHOSCOPY N/A 11/17/2015   Procedure: VIDEO BRONCHOSCOPY WITH FLUORO;  Surgeon: Rigoberto Noel, MD;  Location: [place];  Service: Cardiopulmonary;  Laterality: N/A;   HPI:  81 year old M with PMH of combined CHF/ICM, CAD/CABG, COPD, CKD-3A, right lung cancer, HTN, osteoarthritis and anemia returning on 06/20/2022 with shortness of breath, orthopnea and BLE edema and admitted for acute respiratory failure with hypoxia due to acute on chronic combined CHF, possible LLL pneumonia and COPD exacerbation. Tested positive for COVID-19 as well. However, during this hospitalization, he developed respiratory distress with increased oxygen requirement the night of 10/25.  PCCM consulted and he was transferred to SDU.  CTA chest negative for PE but multifocal pneumonia concerning for aspiration pneumonia obstructive endobronchial material within RLL bronchus, near occlusive material in RML bronchus, dense consolidation of the RLL and LLL, patchy airspace disease in LUL and LLL and chronic right-sided pleural collection.  CXR 06/23/22 indicated opacity in the left mid and lower lung similar to slightly  worsened in the interval. The right lung remains poorly aerated, similar to slightly worsened in the interval. Dense opacification of  the right apex and right base remains; ST consulted for BSE to assess swallow function.    Assessment / Plan / Recommendation  Clinical Impression  Pt assessed via clinical swallowing evaluation with aphonic vocal quality present prior to CSE.  Pt able to cough/raise velum with vocal quality attined that was low intensity/hoarse in nature. OME unremarkable.  Pt without overt s/s of aspiration with small amounts of ice chips, thin via cup/straw, puree and  solids; however, a delayed cough noted after final bite taken of cracker consistency.  Timely swallow and mastication efforts adequate during consumption.  No reports of issues with medication administration by nursing.  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  Pt did not exhibit any overt s/s of aspiration during this assessment, but is at risk for aspiration without use of esophageal/respiratory/general swallowing precautions during all PO intake d/t deconditioning.  ST recommending continue current diet with above mentioned aspiration/swallowing precautions in place.  ST will f/u for diet tolerance/education for swallowing safety during acute stay.  Thank you for this consult. SLP Visit Diagnosis: Dysphagia, unspecified (R13.10)    Aspiration Risk  Mild aspiration risk    Diet Recommendation   Regular/thin liquids (small bites/sips)  Medication Administration: Whole meds with puree (1 at a time)    Other  Recommendations Oral Care Recommendations: Oral care BID;Patient independent with oral care    Recommendations for follow up therapy are one component of a multi-disciplinary discharge planning process, led by the attending physician.  Recommendations may be updated based on patient status, additional functional criteria  and insurance authorization.  Follow up Recommendations Other (comment) (TBD)      Assistance Recommended at Discharge Other (comment) (TBD)  Functional Status Assessment Patient has had a recent decline in their functional status and demonstrates the ability to make significant improvements in function in a reasonable and predictable amount of time.  Frequency and Duration min 2x/week  1 week       Prognosis Prognosis for Safe Diet Advancement: Good      Swallow Study   General Date of Onset: 06/09/2022 HPI: 81 year old M with PMH of combined  CHF/ICM, CAD/CABG, COPD, CKD-3A, right lung cancer, HTN, osteoarthritis and anemia returning with shortness of breath, orthopnea and BLE edema and admitted for acute respiratory failure with hypoxia due to acute on chronic combined CHF, possible LLL pneumonia and COPD exacerbation. Tested positive for COVID-19 as well. However, he developed respiratory distress with increased oxygen requirement the night of 10/25.  PCCM consulted and he was transferred to SDU.  CTA chest negative for PE but multifocal pneumonia concerning for aspiration pneumonia obstructive endobronchial material within RLL bronchus, near occlusive material in RML bronchus, dense consolidation of the RLL and LLL, patchy airspace disease in LUL and LLL and chronic right-sided pleural collection.    CXR 06/23/22 indicated Opacity in the left mid and lower lung is similar to slightly  worsened in the interval. The right lung remains poorly aerated,  similar to slightly worsened in the interval. Dense opacification of  the right apex and right base remains; ST consulted for BSE to assess swallow function. Type of Study: Bedside Swallow Evaluation Previous Swallow Assessment: n/a Diet Prior to this Study: Regular;Thin liquids Temperature Spikes Noted: No Respiratory Status: Other (comment) (HHFNC) History of Recent Intubation: No Behavior/Cognition: Alert;Cooperative Oral Cavity Assessment: Within Functional Limits Oral Care Completed by SLP: Recent completion by staff Oral Cavity - Dentition: Adequate natural dentition;Missing dentition Vision: Functional for self-feeding Self-Feeding Abilities: Able to feed self Patient Positioning: Upright in bed Baseline Vocal Quality: Aphonic Volitional Cough: Strong Volitional Swallow: Able to elicit    Oral/Motor/Sensory Function Overall Oral Motor/Sensory Function: Within functional limits   Ice Chips Ice chips: Within functional limits Presentation: Spoon   Thin Liquid Thin Liquid: Within  functional limits Presentation: Cup;Straw    Nectar Thick Nectar Thick Liquid: Not tested   Honey Thick Honey Thick Liquid: Not tested   Puree Puree: Within functional limits Presentation: Self Fed   Solid     Solid: Impaired Presentation: Self Fed Oral Phase Functional Implications: Other (comment) (delayed cough)      Elvina Sidle, M.S., CCC-SLP 06/23/2022,1:27 PM

## 2022-06-24 DIAGNOSIS — I5043 Acute on chronic combined systolic (congestive) and diastolic (congestive) heart failure: Secondary | ICD-10-CM | POA: Diagnosis not present

## 2022-06-24 DIAGNOSIS — D696 Thrombocytopenia, unspecified: Secondary | ICD-10-CM | POA: Diagnosis not present

## 2022-06-24 DIAGNOSIS — J441 Chronic obstructive pulmonary disease with (acute) exacerbation: Secondary | ICD-10-CM | POA: Diagnosis not present

## 2022-06-24 DIAGNOSIS — N1832 Chronic kidney disease, stage 3b: Secondary | ICD-10-CM | POA: Diagnosis not present

## 2022-06-24 DIAGNOSIS — J9601 Acute respiratory failure with hypoxia: Secondary | ICD-10-CM | POA: Diagnosis not present

## 2022-06-24 LAB — COMPREHENSIVE METABOLIC PANEL
ALT: 17 U/L (ref 0–44)
AST: 18 U/L (ref 15–41)
Albumin: 2.3 g/dL — ABNORMAL LOW (ref 3.5–5.0)
Alkaline Phosphatase: 65 U/L (ref 38–126)
Anion gap: 8 (ref 5–15)
BUN: 46 mg/dL — ABNORMAL HIGH (ref 8–23)
CO2: 37 mmol/L — ABNORMAL HIGH (ref 22–32)
Calcium: 8.1 mg/dL — ABNORMAL LOW (ref 8.9–10.3)
Chloride: 97 mmol/L — ABNORMAL LOW (ref 98–111)
Creatinine, Ser: 1.56 mg/dL — ABNORMAL HIGH (ref 0.61–1.24)
GFR, Estimated: 44 mL/min — ABNORMAL LOW (ref >60)
Glucose, Bld: 152 mg/dL — ABNORMAL HIGH (ref 70–99)
Potassium: 3.6 mmol/L (ref 3.5–5.1)
Sodium: 142 mmol/L (ref 135–145)
Total Bilirubin: 0.8 mg/dL (ref 0.3–1.2)
Total Protein: 6.1 g/dL — ABNORMAL LOW (ref 6.5–8.1)

## 2022-06-24 LAB — GLUCOSE, CAPILLARY
Glucose-Capillary: 124 mg/dL — ABNORMAL HIGH (ref 70–99)
Glucose-Capillary: 149 mg/dL — ABNORMAL HIGH (ref 70–99)
Glucose-Capillary: 138 mg/dL — ABNORMAL HIGH (ref 70–99)
Glucose-Capillary: 192 mg/dL — ABNORMAL HIGH (ref 70–99)

## 2022-06-24 LAB — CBC
HCT: 31.5 % — ABNORMAL LOW (ref 39.0–52.0)
Hemoglobin: 9.7 g/dL — ABNORMAL LOW (ref 13.0–17.0)
MCH: 26.7 pg (ref 26.0–34.0)
MCHC: 30.8 g/dL (ref 30.0–36.0)
MCV: 86.8 fL (ref 80.0–100.0)
Platelets: 49 10*3/uL — ABNORMAL LOW (ref 150–400)
RBC: 3.63 MIL/uL — ABNORMAL LOW (ref 4.22–5.81)
RDW: 16.7 % — ABNORMAL HIGH (ref 11.5–15.5)
WBC: 8.4 10*3/uL (ref 4.0–10.5)
nRBC: 0 % (ref 0.0–0.2)

## 2022-06-24 LAB — MAGNESIUM: Magnesium: 2.6 mg/dL — ABNORMAL HIGH (ref 1.7–2.4)

## 2022-06-24 LAB — PHOSPHORUS: Phosphorus: 2.4 mg/dL — ABNORMAL LOW (ref 2.5–4.6)

## 2022-06-24 LAB — D-DIMER, QUANTITATIVE: D-Dimer, Quant: 8.95 ug/mL-FEU — ABNORMAL HIGH (ref 0.00–0.50)

## 2022-06-24 LAB — C-REACTIVE PROTEIN: CRP: 3.7 mg/dL — ABNORMAL HIGH (ref <1.0)

## 2022-06-24 MED ORDER — POTASSIUM CHLORIDE CRYS ER 20 MEQ PO TBCR
40.0000 meq | EXTENDED_RELEASE_TABLET | Freq: Once | ORAL | Status: AC
  Administered 2022-06-24: 40 meq via ORAL
  Filled 2022-06-24: qty 2

## 2022-06-24 MED ORDER — SPIRONOLACTONE 25 MG PO TABS
25.0000 mg | ORAL_TABLET | Freq: Every day | ORAL | Status: DC
  Administered 2022-06-24 – 2022-06-27 (×4): 25 mg via ORAL
  Filled 2022-06-24 (×4): qty 1

## 2022-06-24 NOTE — Progress Notes (Signed)
PROGRESS NOTE  Paul Sandoval. ZYS:[id number] DOB: 03-31-1941   PCP: Coral Spikes, DO  Patient is from: Home.  DOA: 05/30/2022 LOS: 5  Chief complaints Chief Complaint  Patient presents with   Shortness of Breath     Brief Narrative / Interim history: 81 year old M with PMH of combined CHF/ICM, CAD/CABG, COPD, CKD-3A, right lung cancer, HTN, osteoarthritis and anemia returning with shortness of breath, orthopnea and BLE edema and admitted for acute respiratory failure with hypoxia due to acute on chronic combined CHF, possible LLL pneumonia and COPD exacerbation.  BNP elevated to 1000 (higher than baseline).  CXR suggests LLL opacity.  Tested positive for COVID-19 as well.  Patient was started on IV Lasix, Decadron, ceftriaxone, azithromycin and Paxlovid.   Of note, recently hospitalized for COPD and CHF exacerbation 10/7-10/12.  Patient was continued on IV Lasix, Decadron, ceftriaxone, azithromycin and antiviral with improvement in his respiratory symptoms.  However, he developed respiratory distress with increased oxygen requirement the night of 10/25.  PCCM consulted and he was transferred to SDU.  CTA chest negative for PE but multifocal pneumonia concerning for aspiration pneumonia obstructive endobronchial material within RLL bronchus, near occlusive material in RML bronchus, dense consolidation of the RLL and LLL, patchy airspace disease in LUL and LLL and chronic right-sided pleural collection.  Venous Doppler negative for DVT but limited study.  IR thoracocentesis was not successful.  PCCM following.  Subjective: Seen and examined earlier this morning.  No major events overnight of this morning.  No complaints but difficult to verbalize due to hoarse voice.  Denies pain.  Patient's wife at bedside.  Objective: Vitals:   06/24/22 0800 06/24/22 0808 06/24/22 0900 06/24/22 1000  BP: 131/73  (!) 158/63 (!) 141/58  Pulse: (!) 111  (!) 104 (!) 109  Resp: 17  (!) 22 17  Temp:   (!) 97.5 F (36.4 C)    TempSrc:  Axillary    SpO2: 91%  92% 90%  Weight:      Height:        Examination:  GENERAL: No apparent distress.  Nontoxic. HEENT: MMM.  Vision grossly intact.  Diminished hearing.  Hoarse voice. NECK: Supple.  No apparent JVD.  RESP:  No IWOB.  Rhonchi.  Decreased aeration in right lung CVS:  RRR. Heart sounds normal.  ABD/GI/GU: BS+. Abd soft, NTND.  MSK/EXT:  Moves extremities.  Significant muscle mass and subcu fat loss. SKIN: no apparent skin lesion or wound NEURO: Sleepy but wakes to voice.  Oriented.  No apparent focal neuro deficit. PSYCH: Calm. Normal affect.   Procedures:  None  Microbiology summarized: COVID-19 PCR positive.  Assessment and plan: Principal Problem:   Acute on chronic combined systolic and diastolic congestive heart failure (HCC) Active Problems:   HLD (hyperlipidemia)   Essential hypertension   GERD (gastroesophageal reflux disease)   CAD (coronary artery disease)   Thrombocytopenia (HCC)   Stage 3b chronic kidney disease (CKD) ([place])   Primary cancer of right upper lobe of lung (HCC)   Goals of care, counseling/discussion   Pleural effusion on right   Cardiomyopathy, ischemic   History of endovascular stent graft for abdominal aortic aneurysm (AAA)   Acute exacerbation of chronic obstructive pulmonary disease (COPD) (HCC)   Acute respiratory failure with hypoxia (HCC)   Anemia of chronic kidney failure   COVID-19 virus infection   Aspiration pneumonia (HCC)   Acute urinary retention  Acute respiratory failure with hypoxia and hypercarbia: Multifactorial including aspiration multifocal  pneumonia, pleural effusion, acute CHF, COPD exacerbation, COVID-19 infection and CHF.  CTA chest negative for PE but multifocal pneumonia raising concern for aspiration, RLL and RML bronchial obstruction, chronic right pleural effusion.  Increased oxygen requirement.  IR thoracocentesis not successful.  No significant respiratory  distress.  Oxygen requirement improving.  He is full code. -PCCM following. -Continue BiPAP as needed and at night if he tolerates -CTX and Zithromax 10/23 to 10/27 >>>Unasyn 10/27>>> for 5 more days -Continue IV Solu-Medrol 80 mg twice daily -Continue scheduled and as needed nebs -Aspiration precaution -Appreciate evaluation and help by SLP -Incentive spirometry, OOB, PT/OT  Acute on chronic combined CHF: Presents with SOB, orthopnea and BLE edema.  BNP evaded to 900s.  JVD, BNP and edema improving.  TTE in 03/2022 with LVEF of 30 to 35%, GH, G1-DD and mildly reduced RVSF.  Patient reports compliance with Farxiga, Lasix, diet and salt intake.  I and O incomplete but net -3.8 L.  Cr improved.  BP improved. -Continue IV Lasix 40 mg daily -Resume p.o. Aldactone. -Continue holding Entresto -Continue Toprol-XL 75 mg daily -Strict intake and output, renal functions and electrolytes -Continue TED hose and leg elevation  Acute exacerbation of chronic obstructive pulmonary disease: Could be in the setting of COVID-19 infection and possible pneumonia. -Continue IV Solu-Medrol, antibiotics and nebulizers -BiPAP as needed  COVID-19 infection: Fully vaccinated.  Elevated inflammatory markers.  Pro-Cal 0.19.  CRP improved but the D-dimer trended up. -Management as above -Continue isolation precaution  History of CAD s/p CABG in 1996 and PCI to dLM and SVG-D in 02/2014: Mildly elevated troponin.  No chest pain. -Continue home meds-Plavix and Crestor -Continue Toprol-XL -Outpatient follow-up with cardiology  Sinus tachycardia/PVCs: EKG NSR with RBBB. -Toprol-XL as above -Optimize electrolytes  CKD-3B/hyponatremia: Stable. Recent Labs    06/05/22 0355 06/06/22 0339 06/07/22 0405 05/29/2022 3762 06/19/22 0410 06/20/22 0408 06/21/22 0410 06/22/22 0255 06/23/22 0252 06/24/22 0250  BUN 46* 51* 44* 25* 29* 35* 40* 36* 41* 46*  CREATININE 1.72* 1.85* 1.73* 1.76* 1.83* 1.79* 1.78* 1.55* 1.56*  1.56*  -Continue holding Entresto  Essential hypertension: soft BP this morning.  Now symptomatic. -Cardiac meds as above  Acute urinary retention: Reportedly had 750 cc on I&O cath the night of 10/26.  The scan showed 550cc on 10/27.  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 -Continue Foley catheter -Continue Flomax  GERD Continue PPI.  Thrombocytopenia/normocytic anemia: Stable. -Monitor    Hyperlipidemia -Continue home Crestor  Physical deconditioning -PT/OT eval  Goal of care counseling: Full code-confirmed with patient, son and wife  Body mass index is 24.97 kg/m.           DVT prophylaxis:  Place TED hose Start: 06/19/22 1130 SCDs Start: 06/12/2022 1016 due to thrombocytopenia  Code Status: Full code Family Communication: Updated patient's wife at bedside Level of care: Stepdown Status is: Inpatient Remains inpatient appropriate because: Acute respiratory failure with hypoxia and hypercapnia, CHF exacerbation, COPD exacerbation, COVID-19 infection and possible pneumonia   Final disposition: TBD Consultants:  Pulmonology  Sch Meds:  Scheduled Meds:  arformoterol  15 mcg Nebulization BID   budesonide (PULMICORT) nebulizer solution  0.25 mg Nebulization BID   Chlorhexidine Gluconate Cloth  6 each Topical Daily   clopidogrel  75 mg Oral Daily   dapagliflozin propanediol  10 mg Oral QAC breakfast   furosemide  40 mg Intravenous Daily   methylPREDNISolone (SOLU-MEDROL) injection  80 mg Intravenous Q12H   metoprolol succinate  75  mg Oral Daily   molnupiravir EUA  4 capsule Oral BID   pantoprazole  40 mg Oral Daily   revefenacin  175 mcg Nebulization Daily   rosuvastatin  20 mg Oral QHS   spironolactone  25 mg Oral Daily   tamsulosin  0.4 mg Oral QPC supper   Continuous Infusions:  ampicillin-sulbactam (UNASYN) IV Stopped (06/24/22 0652)   PRN Meds:.acetaminophen **OR** acetaminophen, levalbuterol **AND** ipratropium, menthol-cetylpyridinium,  ondansetron **OR** ondansetron (ZOFRAN) IV, sodium chloride  Antimicrobials: Anti-infectives (From admission, onward)    Start     Dose/Rate Route Frequency Ordered Stop   06/23/22 0600  Ampicillin-Sulbactam (UNASYN) 3 g in sodium chloride 0.9 % 100 mL IVPB        3 g 200 mL/hr over 30 Minutes Intravenous Every 6 hours 06/22/22 1739 06/27/22 2359   06/19/22 2200  molnupiravir EUA (LAGEVRIO) capsule 800 mg        4 capsule Oral 2 times daily 06/19/22 1514 06/24/22 2159   06/19/22 1000  azithromycin (ZITHROMAX) 500 mg in sodium chloride 0.9 % 250 mL IVPB  Status:  Discontinued        500 mg 250 mL/hr over 60 Minutes Intravenous Every 24 hours 06/12/2022 1931 06/22/22 1711   06/19/22 1000  cefTRIAXone (ROCEPHIN) 1 g in sodium chloride 0.9 % 100 mL IVPB        1 g 200 mL/hr over 30 Minutes Intravenous Every 24 hours 06/02/2022 2120 06/22/22 1026   05/27/2022 2200  nirmatrelvir/ritonavir EUA (renal dosing) (PAXLOVID) 2 tablet  Status:  Discontinued       Note to Pharmacy: Please adjust or change treatment as needed.   2 tablet Oral 2 times daily 06/20/2022 1929 06/19/22 1514   06/23/2022 2030  cefTRIAXone (ROCEPHIN) 1 g in sodium chloride 0.9 % 100 mL IVPB  Status:  Discontinued        1 g 200 mL/hr over 30 Minutes Intravenous Every 24 hours 06/01/2022 1931 06/26/2022 2120   06/15/2022 1000  levofloxacin (LEVAQUIN) tablet 500 mg  Status:  Discontinued        500 mg Oral Daily 06/07/2022 0943 06/14/2022 1943        I have personally reviewed the following labs and images: CBC: Recent Labs  Lab 06/19/22 0410 06/20/22 0408 06/21/22 0410 06/22/22 0255 06/23/22 0252 06/24/22 0250  WBC 5.7 4.9 10.0 8.3 9.5 8.4  NEUTROABS 5.4 4.6 9.5* 7.9* 9.2*  --   HGB 9.9* 9.9* 10.8* 9.9* 10.0* 9.7*  HCT 32.0* 31.9* 36.1* 31.5* 32.8* 31.5*  MCV 86.0 85.5 87.8 85.6 86.3 86.8  PLT 68* 58* 73* 55* 50* 49*   BMP &GFR Recent Labs  Lab 06/20/22 0408 06/21/22 0410 06/22/22 0255 06/23/22 0252 06/24/22 0250  NA 131*  134* 136 141 142  K 3.8 3.7 3.4* 3.8 3.6  CL 91* 92* 93* 96* 97*  CO2 29 33* 33* 36* 37*  GLUCOSE 141* 124* 137* 144* 152*  BUN 35* 40* 36* 41* 46*  CREATININE 1.79* 1.78* 1.55* 1.56* 1.56*  CALCIUM 8.0* 8.1* 7.6* 8.1* 8.1*  MG 2.2 2.6* 2.2 2.5* 2.6*  PHOS 4.2 4.1 3.2 2.7 2.4*   Estimated Creatinine Clearance: 37.7 mL/min (A) (by C-G formula based on SCr of 1.56 mg/dL (H)). Liver & Pancreas: Recent Labs  Lab 06/20/22 0408 06/21/22 0410 06/22/22 0255 06/23/22 0252 06/24/22 0250  AST 19 24 19 20 18   ALT 18 22 17 19 17   ALKPHOS 61 77 61 66 65  BILITOT 0.5 0.3 0.7 0.7  0.8  PROT 6.1* 6.9 5.9* 6.3* 6.1*  ALBUMIN 2.2* 2.5* 2.2* 2.3* 2.3*   No results for input(s): "LIPASE", "AMYLASE" in the last 168 hours. No results for input(s): "AMMONIA" in the last 168 hours. Diabetic: No results for input(s): "HGBA1C" in the last 72 hours. Recent Labs  Lab 06/23/22 0804 06/23/22 1253 06/23/22 1632 06/23/22 2223 06/24/22 0759  GLUCAP 167* 193* 168* 188* 124*   Cardiac Enzymes: Recent Labs  Lab 06/19/22 2210 06/20/22 0408  CKTOTAL 21* 20*   No results for input(s): "PROBNP" in the last 8760 hours. Coagulation Profile: No results for input(s): "INR", "PROTIME" in the last 168 hours. Thyroid Function Tests: No results for input(s): "TSH", "T4TOTAL", "FREET4", "T3FREE", "THYROIDAB" in the last 72 hours. Lipid Profile: No results for input(s): "CHOL", "HDL", "LDLCALC", "TRIG", "CHOLHDL", "LDLDIRECT" in the last 72 hours. Anemia Panel: No results for input(s): "VITAMINB12", "FOLATE", "FERRITIN", "TIBC", "IRON", "RETICCTPCT" in the last 72 hours.  Urine analysis:    Component Value Date/Time   COLORURINE YELLOW 06/03/2022 0307   APPEARANCEUR CLEAR 06/03/2022 0307   LABSPEC 1.013 06/03/2022 0307   LABSPEC 1.010 03/28/2016 1244   PHURINE 5.0 06/03/2022 0307   GLUCOSEU >=500 (A) 06/03/2022 0307   GLUCOSEU Negative 03/28/2016 1244   HGBUR NEGATIVE 06/03/2022 0307   BILIRUBINUR  NEGATIVE 06/03/2022 0307   BILIRUBINUR Negative 03/28/2016 [place] 06/03/2022 0307   PROTEINUR 30 (A) 06/03/2022 0307   UROBILINOGEN 0.2 03/28/2016 1244   NITRITE NEGATIVE 06/03/2022 0307   LEUKOCYTESUR NEGATIVE 06/03/2022 0307   LEUKOCYTESUR Trace 03/28/2016 1244   Sepsis Labs: Invalid input(s): "PROCALCITONIN", "LACTICIDVEN"  Microbiology: Recent Results (from the past 240 hour(s))  SARS Coronavirus 2 by RT PCR (hospital order, performed in [facility] hospital lab) *cepheid single result test* Anterior Nasal Swab     Status: Abnormal   Collection Time: 05/31/2022  5:34 PM   Specimen: Anterior Nasal Swab  Result Value Ref Range Status   SARS Coronavirus 2 by RT PCR POSITIVE (A) NEGATIVE Final    Comment: (NOTE) SARS-CoV-2 target nucleic acids are DETECTED  SARS-CoV-2 RNA is generally detectable in upper respiratory specimens  during the acute phase of infection.  Positive results are indicative  of the presence of the identified virus, but do not rule out bacterial infection or co-infection with other pathogens not detected by the test.  Clinical correlation with patient history and  other diagnostic information is necessary to determine patient infection status.  The expected result is negative.  Fact Sheet for Patients:   https://www.patel.info/   Fact Sheet for Healthcare Providers:   https://hall.com/    This test is not yet approved or cleared by the [place] FDA and  has been authorized for detection and/or diagnosis of SARS-CoV-2 by FDA under an Emergency Use Authorization (EUA).  This EUA will remain in effect (meaning this test can be used) for the duration of  the COVID-19 declaration under Section 564(b)(1)  of the Act, 21 U.S.C. section 360-bbb-3(b)(1), unless the authorization is terminated or revoked sooner.   Performed at [facility], [place] [address], [place], [place]  [address]   Expectorated Sputum Assessment w Gram Stain, Rflx to Resp Cult     Status: None   Collection Time: 06/19/22  3:34 PM   Specimen: Expectorated Sputum  Result Value Ref Range Status   Specimen Description EXPECTORATED SPUTUM  Final   Special Requests NONE  Final   Sputum evaluation   Final  Sputum specimen not acceptable for testing.  Please recollect.   NOTIFIED SEZER,N RN AT 2103 ON 06/19/22 BY VAZQUEZJ Performed at [facility], [place] [address], [place], [place] [address]    Report Status 06/20/2022 FINAL  Final  Expectorated Sputum Assessment w Gram Stain, Rflx to Resp Cult     Status: None   Collection Time: 06/20/22 12:11 PM  Result Value Ref Range Status   Specimen Description EXPECTORATED SPUTUM  Final   Special Requests NONE  Final   Sputum evaluation   Final    Sputum specimen not acceptable for testing.  Please recollect.   Vernell Barrier, J RN @1412  ON 06/20/2022 BY Gloriann Loan Performed at [facility], [place] [address], [place], [place] [address]    Report Status 06/20/2022 FINAL  Final  MRSA Next Gen by PCR, Nasal     Status: None   Collection Time: 06/21/22  1:28 PM   Specimen: Nasal Mucosa; Nasal Swab  Result Value Ref Range Status   MRSA by PCR Next Gen NOT DETECTED NOT DETECTED Final    Comment: (NOTE) The GeneXpert MRSA Assay (FDA approved for NASAL specimens only), is one component of a comprehensive MRSA colonization surveillance program. It is not intended to diagnose MRSA infection nor to guide or monitor treatment for MRSA infections. Test performance is not FDA approved in patients less than 85 years old. Performed at [facility], [place] [address], [place], [place] [address]     Radiology Studies: No results found.    Devonte Migues T. [place]  If 7PM-7AM, please contact night-coverage [email or web address] 06/24/2022, 11:03 AM

## 2022-06-24 NOTE — Progress Notes (Signed)
NAME:  Paul Gusler., MRN:  [id number], DOB:  1941/01/01, LOS: 5 ADMISSION DATE:  06/14/2022, CONSULTATION DATE:  06/21/22 REFERRING MD:  Dr. Cyndia Skeeters, CHIEF COMPLAINT:  SOB   History of Present Illness:  81 year old male with multiple co-morbidities including COPD and chronic combined heart failure and recent hospitalization for COPD/CHF exacerbation earlier this month who is re-admitted for CHF/COPD exacerbation in setting of COVID. This morning he had episode of respiratory distress and found in tripod position on 4L O2 with SpO2 in 70-80s. CXR with interval development of small bilateral pleural effusions. He was given lasix, albuterol neb, ativan for anxiety and advanced to heated high flow requiring FIO2 60% on 20L. PCCM consulted for additional recommendations  Pertinent  Medical History  Former smoker, COPD, Chronic HFrEF (03/2022> EF 30-35%/ G1DD/ mildly reduced RVSF)/ HFpEF, HLD, CKD 3b, anemia of chronic disease  Patient of Dr. Elsworth Soho  [facility] Events: Including procedures, antibiotic start and stop dates in addition to other pertinent events   10/23 Admitted to [facility] 10/25 Increased O2 need to heated high flow. PCCM consulted 10/26 attempted right thoracentesis, no fluid obtained 10/26 CT-PA > no PE.  Right lower lobe consolidation with mucous, obstructing material right lower lobe and right middle lobe bronchi.  Left lower lobe and lingular airspace disease.  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  Right basilar pleural 10.8 cm fluid collection  Interim History / Subjective:   Cough productive of white, light yellow sputum Remains on stable FiO2, has not required any BiPAP Feels slightly better, wants to get up to chair Has had some rhythm changes, axis changes on telemetry.  No associated chest pain or any evidence of decompensation  Objective   Blood pressure (!) 147/62, pulse (!) 102, temperature (!) 97.5 F (36.4 C),  temperature source Axillary, resp. rate 16, height 5\' 10"  (1.778 m), weight 78.9 kg, SpO2 92 %.    FiO2 (%):  [50 %-60 %] 50 %   Intake/Output Summary (Last 24 hours) at 06/24/2022 0910 Last data filed at 06/24/2022 0803 Gross per 24 hour  Intake 699.58 ml  Output 1855 ml  Net -1155.42 ml    Filed Weights   06/08/2022 0605  Weight: 78.9 kg   Physical Exam: General: No distress, laying in bed with oxygen in place HENT: Oropharynx moist, no secretions.  Weak voice Eyes: Pupils equal Respiratory: Bilateral rhonchi, more so on the right with bronchial breath sounds on the right Cardiovascular: Distant irregular, no murmur.  Frequent PAC on monitor GI: Nondistended, positive bowel sounds Extremities no edema Neuro: Awake, alert, oriented.  Follows commands and answers questions Skin: No rash    Resolved Hospital Problem list   N/A  Assessment & Plan:   Acute on chronic combined heart failure: EF 30-35%  with grade I DD on 03/2022. 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  He is not currently requiring.  He might benefit from BiPAP nightly to help with overall work of breathing -Continue corticosteroids as ordered for his COVID-19 and exacerbation COPD -Pulmicort/Brovana, nebulized DuoNeb scheduled -Continue I-S, PT, pulmonary hygiene, out of bed -Antibiotics changed to Unasyn on 10/28, agree  with this. -Thoracentesis was attempted on his chronic right pleural effusion, unsuccessful.  Unclear whether any intervention here is needed or possible.  He has undergone VATS and talc in the past.  Clearly  not a candidate for decortication in the setting of this current illness -CT findings concerning for right lower lobe and right middle lobe bronchial obstruction.  Consider aspiration, secretions from pneumonia.  Concerning also for possible recurrent malignancy.  He would benefit from airway inspection by bronchoscopy if/when he is stable to undergo -Continue to follow chest x-ray  CKD IIIB  Cr 1.7-2.0  GOC -Full code  Best Practice (right click and "Reselect all SmartList Selections" daily)   Diet/type: Regular consistency (see orders) DVT prophylaxis: other thrombocytopenia. per primary team GI prophylaxis: PPI Lines: N/A Foley:  N/A Code Status:  full code Last date of multidisciplinary goals of care discussion [10/26] Confirmed with patient  Labs   CBC: Recent Labs  Lab 06/19/22 0410 06/20/22 0408 06/21/22 0410 06/22/22 0255 06/23/22 0252 06/24/22 0250  WBC 5.7 4.9 10.0 8.3 9.5 8.4  NEUTROABS 5.4 4.6 9.5* 7.9* 9.2*  --   HGB 9.9* 9.9* 10.8* 9.9* 10.0* 9.7*  HCT 32.0* 31.9* 36.1* 31.5* 32.8* 31.5*  MCV 86.0 85.5 87.8 85.6 86.3 86.8  PLT 68* 58* 73* 55* 50* 49*     Basic Metabolic Panel: Recent Labs  Lab 06/20/22 0408 06/21/22 0410 06/22/22 0255 06/23/22 0252 06/24/22 0250  NA 131* 134* 136 141 142  K 3.8 3.7 3.4* 3.8 3.6  CL 91* 92* 93* 96* 97*  CO2 29 33* 33* 36* 37*  GLUCOSE 141* 124* 137* 144* 152*  BUN 35* 40* 36* 41* 46*  CREATININE 1.79* 1.78* 1.55* 1.56* 1.56*  CALCIUM 8.0* 8.1* 7.6* 8.1* 8.1*  MG 2.2 2.6* 2.2 2.5* 2.6*  PHOS 4.2 4.1 3.2 2.7 2.4*    GFR: Estimated Creatinine Clearance: 37.7 mL/min (A) (by C-G formula based on SCr of 1.56 mg/dL (H)). Recent Labs  Lab 06/10/2022 2017 06/19/22 0410 06/20/22 0408 06/21/22 0410 06/22/22 0255 06/23/22 0252 06/24/22 0250  PROCALCITON 0.19 0.17  --   --   --   --   --   WBC  --  5.7   < > 10.0 8.3 9.5 8.4   < > = values in this interval not displayed.     Liver Function Tests: Recent Labs   Lab 06/20/22 0408 06/21/22 0410 06/22/22 0255 06/23/22 0252 06/24/22 0250  AST 19 24 19 20 18   ALT 18 22 17 19 17   ALKPHOS 61 77 61 66 65  BILITOT 0.5 0.3 0.7 0.7 0.8  PROT 6.1* 6.9 5.9* 6.3* 6.1*  ALBUMIN 2.2* 2.5* 2.2* 2.3* 2.3*    No results for input(s): "LIPASE", "AMYLASE" in the last 168 hours. No results for input(s): "AMMONIA" in the last 168 hours.  ABG    Component Value Date/Time   PHART 7.41 06/21/2022 0715   PCO2ART 59 (H) 06/21/2022 0715   PO2ART 63 (L) 06/21/2022 0715   HCO3 37.4 (H) 06/21/2022 0715   TCO2 33 (H) 05/15/2017 0935   TCO2 33 (H) 05/15/2017 0935   ACIDBASEDEF 0.4 06/03/2022 0521   O2SAT 93.7 06/21/2022 0715     Coagulation Profile: No results for input(s): "INR", "PROTIME" in the last 168 hours.  Cardiac Enzymes: Recent Labs  Lab 06/19/22 2210 06/20/22 0408  CKTOTAL 21* 20*     CBG: Recent Labs  Lab 06/23/22 0804 06/23/22 1253 06/23/22 1632 06/23/22 2223 06/24/22 0759  GLUCAP 167*  193* 168* 188* 124*      Critical care time: n/a    Baltazar Apo, MD, PhD 06/24/2022, 9:10 AM [facility] [phone] or if no answer before 7:00PM call [phone] For any issues after 7:00PM please call eLink [phone]

## 2022-06-24 NOTE — Progress Notes (Addendum)
Re educated PT on importance of Flutter valve- demonstrated hands on understanding.

## 2022-06-25 DIAGNOSIS — J441 Chronic obstructive pulmonary disease with (acute) exacerbation: Secondary | ICD-10-CM | POA: Diagnosis not present

## 2022-06-25 DIAGNOSIS — N1832 Chronic kidney disease, stage 3b: Secondary | ICD-10-CM | POA: Diagnosis not present

## 2022-06-25 DIAGNOSIS — I5043 Acute on chronic combined systolic (congestive) and diastolic (congestive) heart failure: Secondary | ICD-10-CM | POA: Diagnosis not present

## 2022-06-25 DIAGNOSIS — D696 Thrombocytopenia, unspecified: Secondary | ICD-10-CM | POA: Diagnosis not present

## 2022-06-25 LAB — RENAL FUNCTION PANEL
Albumin: 2.6 g/dL — ABNORMAL LOW (ref 3.5–5.0)
Anion gap: 9 (ref 5–15)
BUN: 45 mg/dL — ABNORMAL HIGH (ref 8–23)
CO2: 37 mmol/L — ABNORMAL HIGH (ref 22–32)
Calcium: 8.3 mg/dL — ABNORMAL LOW (ref 8.9–10.3)
Chloride: 95 mmol/L — ABNORMAL LOW (ref 98–111)
Creatinine, Ser: 1.59 mg/dL — ABNORMAL HIGH (ref 0.61–1.24)
GFR, Estimated: 43 mL/min — ABNORMAL LOW (ref >60)
Glucose, Bld: 167 mg/dL — ABNORMAL HIGH (ref 70–99)
Phosphorus: 3.5 mg/dL (ref 2.5–4.6)
Potassium: 3.5 mmol/L (ref 3.5–5.1)
Sodium: 141 mmol/L (ref 135–145)

## 2022-06-25 LAB — CBC
HCT: 34.3 % — ABNORMAL LOW (ref 39.0–52.0)
Hemoglobin: 10.2 g/dL — ABNORMAL LOW (ref 13.0–17.0)
MCH: 26.3 pg (ref 26.0–34.0)
MCHC: 29.7 g/dL — ABNORMAL LOW (ref 30.0–36.0)
MCV: 88.4 fL (ref 80.0–100.0)
Platelets: 66 10*3/uL — ABNORMAL LOW (ref 150–400)
RBC: 3.88 MIL/uL — ABNORMAL LOW (ref 4.22–5.81)
RDW: 17 % — ABNORMAL HIGH (ref 11.5–15.5)
WBC: 11.2 10*3/uL — ABNORMAL HIGH (ref 4.0–10.5)
nRBC: 0 % (ref 0.0–0.2)

## 2022-06-25 LAB — C-REACTIVE PROTEIN: CRP: 3.4 mg/dL — ABNORMAL HIGH (ref <1.0)

## 2022-06-25 LAB — GLUCOSE, CAPILLARY
Glucose-Capillary: 164 mg/dL — ABNORMAL HIGH (ref 70–99)
Glucose-Capillary: 120 mg/dL — ABNORMAL HIGH (ref 70–99)
Glucose-Capillary: 114 mg/dL — ABNORMAL HIGH (ref 70–99)
Glucose-Capillary: 128 mg/dL — ABNORMAL HIGH (ref 70–99)
Glucose-Capillary: 150 mg/dL — ABNORMAL HIGH (ref 70–99)

## 2022-06-25 LAB — BRAIN NATRIURETIC PEPTIDE: B Natriuretic Peptide: 1776.9 pg/mL — ABNORMAL HIGH (ref 0.0–100.0)

## 2022-06-25 LAB — D-DIMER, QUANTITATIVE: D-Dimer, Quant: 11.37 ug/mL-FEU — ABNORMAL HIGH (ref 0.00–0.50)

## 2022-06-25 LAB — MAGNESIUM: Magnesium: 2.6 mg/dL — ABNORMAL HIGH (ref 1.7–2.4)

## 2022-06-25 MED ORDER — FUROSEMIDE 10 MG/ML IJ SOLN
40.0000 mg | Freq: Two times a day (BID) | INTRAMUSCULAR | Status: DC
  Administered 2022-06-25 – 2022-06-29 (×8): 40 mg via INTRAVENOUS
  Filled 2022-06-25 (×8): qty 4

## 2022-06-25 MED ORDER — METHYLPREDNISOLONE SODIUM SUCC 125 MG IJ SOLR
81.2500 mg | Freq: Every day | INTRAMUSCULAR | Status: DC
  Administered 2022-06-25 – 2022-06-29 (×5): 81.25 mg via INTRAVENOUS
  Filled 2022-06-25 (×4): qty 2

## 2022-06-25 MED ORDER — SODIUM CHLORIDE 3 % IN NEBU
4.0000 mL | INHALATION_SOLUTION | Freq: Two times a day (BID) | RESPIRATORY_TRACT | Status: AC
  Administered 2022-06-25 – 2022-06-27 (×6): 4 mL via RESPIRATORY_TRACT
  Filled 2022-06-25 (×6): qty 4

## 2022-06-25 MED ORDER — SACUBITRIL-VALSARTAN 24-26 MG PO TABS
1.0000 | ORAL_TABLET | Freq: Two times a day (BID) | ORAL | Status: DC
  Administered 2022-06-25 – 2022-06-27 (×5): 1 via ORAL
  Filled 2022-06-25 (×6): qty 1

## 2022-06-25 MED ORDER — GUAIFENESIN ER 600 MG PO TB12
1200.0000 mg | ORAL_TABLET | Freq: Two times a day (BID) | ORAL | Status: DC
  Administered 2022-06-25 – 2022-06-27 (×5): 1200 mg via ORAL
  Filled 2022-06-25 (×5): qty 2

## 2022-06-25 MED ORDER — POTASSIUM CHLORIDE CRYS ER 20 MEQ PO TBCR
40.0000 meq | EXTENDED_RELEASE_TABLET | Freq: Once | ORAL | Status: AC
  Administered 2022-06-25: 40 meq via ORAL
  Filled 2022-06-25: qty 2

## 2022-06-25 NOTE — TOC Initial Note (Signed)
Transition of Care (TOC) - Initial/Assessment Note    Patient Details  [name]. MRN: [id number] Date of Birth: [birth year]  Transition of Care [facility]) CM/SW Contact:    Dessa Phi, RN Phone Number: 06/25/2022, 11:28 AM  Clinical Narrative: PT recc otpt PT,rw-spoke to spouse/patient-decline otpt PT-he will set up on own once d/c,also ecline rw-will get on onw. [facility] home 02 travel tank. Has own transport home.                 Expected Discharge Plan: Home/Self Care Barriers to Discharge: Continued Medical Work up   Patient Goals and CMS Choice Patient states their goals for this hospitalization and ongoing recovery are::  (Home) CMS Medicare.gov Compare Post Acute Care list provided to:: Patient (Ann(spouse)) Choice offered to / list presented to : Patient, Spouse  Expected Discharge Plan and Services Expected Discharge Plan: Home/Self Care   Discharge Planning Services: CM Consult Post Acute Care Choice: NA Living arrangements for the past 2 months: Single Family Home                 DME Arranged: Patient refused services                    Prior Living Arrangements/Services Living arrangements for the past 2 months: Single Family Home Lives with:: Spouse Patient language and need for interpreter reviewed:: Yes Do you feel safe going back to the place where you live?: Yes      Need for Family Participation in Patient Care: Yes (Comment) Care giver support system in place?: Yes (comment) Current home services: DME (Active w/[facility]-home 02 has travel tank.) Criminal Activity/Legal Involvement Pertinent to Current Situation/Hospitalization: No - Comment as needed  Activities of Daily Living Home Assistive Devices/Equipment: Eyeglasses, Nebulizer ADL Screening (condition at time of admission) Patient's cognitive ability adequate to safely complete daily activities?: Yes Is the patient deaf or have difficulty hearing?: No Does the patient  have difficulty seeing, even when wearing glasses/contacts?: No Does the patient have difficulty concentrating, remembering, or making decisions?: No Patient able to express need for assistance with ADLs?: Yes Does the patient have difficulty dressing or bathing?: No Independently performs ADLs?: Yes (appropriate for developmental age) Does the patient have difficulty walking or climbing stairs?: Yes Weakness of Legs: Both Weakness of Arms/Hands: Both  Permission Sought/Granted Permission sought to share information with : Case Manager Permission granted to share information with : Yes, Verbal Permission Granted  Share Information with NAME:  (Case Manager)           Emotional Assessment Appearance:: Appears stated age Attitude/Demeanor/Rapport: Gracious Affect (typically observed): Accepting Orientation: : Oriented to Self, Oriented to Place, Oriented to  Time, Oriented to Situation Alcohol / Substance Use: Not Applicable Psych Involvement: No (comment)  Admission diagnosis:  Shortness of breath [R06.02] Acute on chronic combined systolic and diastolic congestive heart failure (HCC) [I50.43] Patient Active Problem List   Diagnosis Date Noted   Aspiration pneumonia ([place]) 06/22/2022   Acute urinary retention 06/22/2022   Acute on chronic combined systolic and diastolic congestive heart failure ([place]) 06/11/2022   COVID-19 virus infection 06/22/2022   Acute respiratory failure with hypoxia ([place]) 06/03/2022   Anemia of chronic kidney failure 06/03/2022   Acute exacerbation of chronic obstructive pulmonary disease (COPD) ([place]) 06/02/2022   Anemia 07/04/2021   Chronic respiratory failure with hypoxia ([place]) 06/13/2021   Bacterial colonization of lower respiratory tract 03/17/2019   History of endovascular stent graft for  abdominal aortic aneurysm (AAA) 01/02/2017   Cardiomyopathy, ischemic 11/05/2016   Pleural effusion on right    Goals of care, counseling/discussion 12/19/2015    Primary cancer of right upper lobe of lung ([place]) 11/24/2015   Stage 3b chronic kidney disease (CKD) ([place]) 11/11/2015   Thrombocytopenia ([place]) 02/17/2015   Chronic obstructive airway disease with asthma 02/17/2015   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/21/2013   CAD (coronary artery disease) 01/19/2013   Cerebrovascular disease 12/18/2011   GERD (gastroesophageal reflux disease)    Arteriosclerotic cardiovascular disease (ASCVD) 12/06/2009   HLD (hyperlipidemia) 12/05/2009   Essential hypertension 12/05/2009   PCP:  Coral Spikes, DO Pharmacy:   [place] [place] [address] [place] [address] Phone: [phone] Fax: [phone]     Social Determinants of Health (SDOH) Interventions    Readmission Risk Interventions    06/25/2022   11:26 AM  Readmission Risk Prevention Plan  Medication Review (RN Care Manager) Complete  PCP or Specialist appointment within 3-5 days of discharge Complete  HRI or Home Care Consult Complete  SW Recovery Care/Counseling Consult Complete  Palliative Care Screening Not Applicable

## 2022-06-25 NOTE — Progress Notes (Signed)
No bipap in room at this time

## 2022-06-25 NOTE — Progress Notes (Signed)
Physical Therapy Treatment Patient Details [name]. MRN: [id number] DOB: 29-Nov-1940 Today's Date: 06/25/2022   History of Present Illness 81 year old M admitted 06/16/2022 with shortness of breath, orthopnea and BLE edema and admitted for acute respiratory failure with hypoxia due to acute on chronic combined CHF, possible LLL pneumonia and COPD exacerbation. CXR suggests LLL opacity.  + COVID-19.Of note, recently hospitalized for COPD and CHF exacerbation 10/7-10/12. PMH includes combined CHF/ICM, CAD/CABG, COPD, CKD-3A, right lung cancer, HTN, osteoarthritis and anemia.    PT Comments    Pt received utilizing BSC with RN present for duration of session. Pt on 30LO2 via HHF with 60%; SpO2 monitored throughout 90-100%, HR 89-115. Pt completed all exercises with supervision, management of lines and leads only. Reinforced use of flutter valve and IS every hour and confirmed that pt demonstrates good technique with use; also reinforced with wife that pt should complete every hour. Encouraged pt to complete BLE exercises outside of therapy session while in chair/bed. Pt in recliner, repositioned for comfort, encouraged pt to sit up as long as tolerated. Exited pt's room with RN still present. Discharge destination remains appropriate. We will continue to follow acutely.     Recommendations for follow up therapy are one component of a multi-disciplinary discharge planning process, led by the attending physician.  Recommendations may be updated based on patient status, additional functional criteria and insurance authorization.  Follow Up Recommendations  Outpatient PT     Assistance Recommended at Discharge Set up Supervision/Assistance  Patient can return home with the following A little help with walking and/or transfers;A little help with bathing/dressing/bathroom;Assistance with cooking/housework;Assist for transportation;Help with stairs or ramp for entrance   Equipment  Recommendations  Rolling walker (2 wheels)    Recommendations for Other Services       Precautions / Restrictions Precautions Precaution Comments: monitor O2, COVID Restrictions Weight Bearing Restrictions: No     Mobility  Bed Mobility               General bed mobility comments: Pt received on BSC with RN present    Transfers Overall transfer level: Needs assistance Equipment used: None Transfers: Sit to/from Stand Sit to Stand: Supervision           General transfer comment: supervision for safety, no physical assist required, management of lines/leads only.    Ambulation/Gait Ambulation/Gait assistance: Supervision Gait Distance (Feet): 20 Feet Assistive device: None Gait Pattern/deviations: Step-through pattern, Decreased stride length, Trunk flexed, Shuffle, Wide base of support Gait velocity: decr     General Gait Details: Pt completed very short bouts of forward/backwards ambultion within limits of lines/leads, approximately 21ft x 5x twice with on seated rest break, estimate 60ft. Supervision, no physical assist require or overt LOB noted, management of lines and leads only. Distance limited by lines and fatigue   Stairs             Wheelchair Mobility    Modified Rankin (Stroke Patients Only)       Balance Overall balance assessment: Needs assistance Sitting-balance support: No upper extremity supported, Feet supported Sitting balance-Leahy Scale: Good     Standing balance support: No upper extremity supported, During functional activity Standing balance-Leahy Scale: Fair Standing balance comment: actively reaches for environmental support                            Cognition Arousal/Alertness: Awake/alert Behavior During Therapy: [facility] for tasks assessed/performed Overall Cognitive  Status: Within Functional Limits for tasks assessed                                 General Comments: Pt exhibited some  "hoarsness" and very quiet speech but was intelligible and able to make himself understood        Exercises General Exercises - Lower Extremity Ankle Circles/Pumps: AROM, Both, 5 reps Straight Leg Raises: AROM, Both (educated but not performed) Other Exercises Other Exercises: Sit to stands at recliner: x15 allowing for rest breaks and for Spo2 to recover, pt utilizes BUE on recliner armrests to power up.    General Comments General comments (skin integrity, edema, etc.): Pt on 30L HHFat 60%FiO2 monitored throughout with range of 90-100%, pt demosntrated excellent ability to perform pursed lip breathing during rest breaks. HR ranged from 89-115.      Pertinent Vitals/Pain Pain Assessment Pain Assessment: No/denies pain    Home Living                          Prior Function            PT Goals (current goals can now be found in the care plan section) Acute Rehab PT Goals Patient Stated Goal: get better and home PT Goal Formulation: With patient Time For Goal Achievement: 07/04/22 Potential to Achieve Goals: Good Progress towards PT goals: Progressing toward goals    Frequency    Min 3X/week      PT Plan Current plan remains appropriate    Co-evaluation              AM-PAC PT "6 Clicks" Mobility   Outcome Measure  Help needed turning from your back to your side while in a flat bed without using bedrails?: A Little Help needed moving from lying on your back to sitting on the side of a flat bed without using bedrails?: A Little Help needed moving to and from a bed to a chair (including a wheelchair)?: A Little Help needed standing up from a chair using your arms (e.g., wheelchair or bedside chair)?: A Little Help needed to walk in hospital room?: A Little Help needed climbing 3-5 steps with a railing? : A Little 6 Click Score: 18    End of Session Equipment Utilized During Treatment: Oxygen Activity Tolerance: Patient tolerated treatment well;No  increased pain Patient left: with call bell/phone within reach;in chair;with chair alarm set;with nursing/sitter in room;with family/visitor present Nurse Communication: Mobility status PT Visit Diagnosis: Muscle weakness (generalized) (M62.81);Other abnormalities of gait and mobility (R26.89);Difficulty in walking, not elsewhere classified (R26.2)     Time: 4196-2229 PT Time Calculation (min) (ACUTE ONLY): 28 min  Charges:  $Gait Training: 8-22 mins                     Coolidge Breeze, PT, DPT [facility] Rehabilitation Department Office: [phone] Weekend pager: [phone]   Coolidge Breeze 06/25/2022, 11:08 AM

## 2022-06-25 NOTE — Progress Notes (Addendum)
NAME:  Paul Sandoval., MRN:  [id number], DOB:  09-Jul-1941, LOS: 6 ADMISSION DATE:  05/31/2022, CONSULTATION DATE:  10/26 REFERRING MD:  Cyndia Skeeters, CHIEF COMPLAINT:  Dyspnea   History of Present Illness:  81 y/o male admitted with acute respiratory failure with hypoxemia in the setting of COVID 19  pneumonia.  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  Pertinent  Medical History  Former smoker, COPD, Chronic HFrEF (03/2022> EF 30-35%/ G1DD/ mildly reduced RVSF)/ HFpEF, HLD, CKD 3b, anemia of chronic disease; stage IIIa NSCLC RUL treated with chemo/rad 2017   Patient of Dr. Elsworth Soho  [facility] Events: Including procedures, antibiotic start and stop dates in addition to other pertinent events   10/23 Admitted to [facility] 10/25 Increased O2 need to heated high flow. PCCM consulted 10/26 attempted right thoracentesis, no fluid obtained 10/26 CT-PA > no PE.  Right lower lobe consolidation with mucous, obstructing material right lower lobe and right middle lobe bronchi.  Left lower lobe and lingular airspace disease.  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  Right basilar pleural 10.8 cm fluid collection  Interim History / Subjective:  Chest congestion, still coughing up grey phlegm Remains on HFNC  Objective   Blood pressure (!) 144/93, pulse 83, temperature 97.7 F (36.5 C), temperature source Oral, resp. rate 15, height 5\' 10"  (1.778 m), weight 78.9 kg, SpO2 96 %.    FiO2 (%):  [50 %-60 %] 60 %   Intake/Output Summary (Last 24 hours) at 06/25/2022 0752 Last data filed at 06/24/2022 1800 Gross per 24 hour  Intake 1120 ml  Output 1725 ml  Net -605 ml   Filed Weights   06/12/2022 0605  Weight: 78.9 kg    Examination: General:  Resting comfortably in bed HENT: NCAT OP clear PULM: Rhonchi and wheezing bilaterally, diminished RLL, normal effort CV: RRR, no mgr GI: BS+, soft, nontender MSK: normal bulk  and tone Neuro: awake, alert, no distress, MAEW   Resolved Hospital Problem list     Assessment & Plan:  He is critically ill due to:  Acute on chronic combined CHF LVEF 30-35% Diuresis per primary team  COPD exacerbation in setting of COVID infection RUL Pneumonia Unasyn to continue, would plan for 5 day course Could consider changing to Ceftriaxone for decreased salt load Agree with solumedrol, will lower dose to 1mg /kg/day Continue pulmicort/yupelri/brovana Will add hypertonic saline nebulized and guaifenesin for pulm toilette Needs to get out of bed Needs to use flutter valve  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 Would hold off on further thoracentesis attempts unless there is a significant change in the effusion  Stage IIIa RUL NSCLC s/p chemo/radiation in 2017 Abnormal CT chest with RLL and RML bronchial obstruction Will need bronchoscopy when oxygen needs improve  Acute on chronic respiratory failure with hypoxemia Wean off Heated high flow as able Tolerate periods of hypoxemia, goal at rest is greater than 85% SaO2, with movement ideally above 75% Decision for intubation should be based on a change in mental status or physical evidence of ventilatory failure such as nasal flaring, accessory muscle use, paradoxical breathing Out of bed to chair as able Incentive spirometry is important, use every hour Maintain close monitoring in ICU setting    Best Practice (right click and "Reselect all SmartList Selections" daily)   Per HPI  Critical care time: 32 minutes    Roselie Awkward, MD [employer]  PCCM Pager: [phone] Cell: [phone] After 7:00 pm call Elink  [phone]

## 2022-06-25 NOTE — Progress Notes (Addendum)
PROGRESS NOTE  Paul Sandoval. QPY:[id number] DOB: [birth year]   PCP: Coral Spikes, DO  Patient is from: Home.  DOA: 05/27/2022 LOS: 6  Chief complaints Chief Complaint  Patient presents with   Shortness of Breath     Brief Narrative / Interim history: 81 year old M with PMH of combined CHF/ICM, CAD/CABG, COPD, CKD-3A, right lung cancer, HTN, osteoarthritis, anemia and recent hospitalization for COPD and CHF exacerbation returning with SOB, orthopnea and BLE edema and admitted for acute respiratory failure with hypoxia due to acute on chronic combined CHF,  pneumonia and COPD exacerbation.  BNP elevated to 1000 (higher than baseline).  CXR suggests LLL opacity.  Tested positive for COVID-19 as well.  Started on IV Lasix, Decadron, ceftriaxone, azithromycin and Paxlovid.   Patient was continued on IV Lasix, Decadron, ceftriaxone, azithromycin and antiviral with improvement in his respiratory symptoms.  However, he developed respiratory distress with increased oxygen requirement the night of 10/25.  PCCM consulted and he was transferred to SDU.  CTA chest negative for PE but multifocal pneumonia concerning for aspiration pneumonia obstructive endobronchial material within RLL bronchus, near occlusive material in RML bronchus, dense consolidation of the RLL and LLL, patchy airspace disease in LUL and LLL and chronic right-sided pleural collection.  Venous Doppler negative for DVT but limited study.  IR thoracocentesis was not successful.  PCCM following.  Subjective: Seen and examined earlier this morning.  No major events overnight of this morning.  Some improvement in oxygen requirement.  Patient denies respiratory distress.   Objective: Vitals:   06/25/22 0900 06/25/22 1000 06/25/22 1058 06/25/22 1100  BP: (!) 138/121 (!) 158/66  (!) 156/83  Pulse: (!) 30   (!) 103  Resp: (!) 23 (!) 27  (!) 21  Temp:      TempSrc:      SpO2: 99%  96% 94%  Weight:      Height:         Examination:  GENERAL: No apparent distress.  Nontoxic. HEENT: MMM.  Vision grossly intact.  Hard of hearing.  Hoarse voice. NECK: Supple.  No apparent JVD.  RESP:  No IWOB.  Rhonchi.  Decreased aeration in right lung. CVS:  RRR. Heart sounds normal.  ABD/GI/GU: BS+. Abd soft, NTND.  MSK/EXT:  Moves extremities. No apparent deformity. No edema.  TED hose on SKIN: no apparent skin lesion or wound NEURO: Awake and alert. Oriented appropriately.  No apparent focal neuro deficit. PSYCH: Calm. Normal affect.  Procedures:  None  Microbiology summarized: COVID-19 PCR positive.  Assessment and plan: Principal Problem:   Acute on chronic combined systolic and diastolic congestive heart failure (HCC) Active Problems:   HLD (hyperlipidemia)   Essential hypertension   GERD (gastroesophageal reflux disease)   CAD (coronary artery disease)   Thrombocytopenia (HCC)   Stage 3b chronic kidney disease (CKD) (HCC)   Primary cancer of right upper lobe of lung (HCC)   Goals of care, counseling/discussion   Pleural effusion on right   Cardiomyopathy, ischemic   History of endovascular stent graft for abdominal aortic aneurysm (AAA)   Acute exacerbation of chronic obstructive pulmonary disease (COPD) (HCC)   Acute respiratory failure with hypoxia (HCC)   Anemia of chronic kidney failure   COVID-19 virus infection   Aspiration pneumonia (HCC)   Acute urinary retention  Acute respiratory failure with hypoxia and hypercarbia: Multifactorial including aspiration multifocal pneumonia, pleural effusion, acute CHF, COPD exacerbation, COVID-19 infection and CHF.  CTA chest negative for PE but multifocal  pneumonia raising concern for aspiration, RLL and RML bronchial obstruction, chronic right pleural effusion.  Increased oxygen requirement.  IR thoracocentesis unsuccessful.  No significant respiratory distress.  Oxygen requirement improving.  He is full code. -PCCM following. -I agree with  permissive hypoxemia per PCCM -CTX and Zithromax 10/23 to 10/27 >>>Unasyn 10/27>>> for 5 more days -Continue IV Solu-Medrol 80 mg twice daily -Continue scheduled and as needed nebs -Aspiration precaution -Appreciate evaluation and help by SLP -Incentive spirometry, OOB, PT/OT  Acute on chronic combined CHF: Presents with SOB, orthopnea and BLE edema.  BNP evaded to 900s.  JVD, BNP and edema improving.  TTE in 03/2022 with LVEF of 30 to 35%, GH, G1-DD and mildly reduced RVSF.  Patient reports compliance with Farxiga, Lasix, diet and salt intake.  I and O incomplete but net -3.8 L.  Cr improved.  BNP and BP rising. -Increase IV Lasix to 40 mg twice daily -Resumed p.o. Aldactone on 10/29. -Resume Entresto at low-dose  -Continue Toprol-XL 75 mg daily -Strict intake and output, renal functions and electrolytes -Continue TED hose and leg elevation  Acute exacerbation of chronic obstructive pulmonary disease: Could be in the setting of COVID-19 infection and possible pneumonia. -Continue IV Solu-Medrol, antibiotics and nebulizers -BiPAP as needed  COVID-19 infection: Fully vaccinated.  Elevated inflammatory markers.  Pro-Cal 0.19.  CRP improved but the D-dimer trended up. -Management as above -Continue isolation precaution  History of CAD s/p CABG in 1996 and PCI to dLM and SVG-D in 02/2014: Mildly elevated troponin likely demand ischemia.  No chest pain. -Continue home meds-Plavix and Crestor -Continue Toprol-XL -Outpatient follow-up with cardiology  Sinus tachycardia/PVCs: EKG NSR with RBBB. -Toprol-XL as above -Optimize electrolytes  CKD-3B/hyponatremia: Stable. Recent Labs    06/06/22 0339 06/07/22 0405 06/21/2022 5361 06/19/22 0410 06/20/22 0408 06/21/22 0410 06/22/22 0255 06/23/22 0252 06/24/22 0250 06/25/22 0300  BUN 51* 44* 25* 29* 35* 40* 36* 41* 46* 45*  CREATININE 1.85* 1.73* 1.76* 1.83* 1.79* 1.78* 1.55* 1.56* 1.56* 1.59*  -Continue monitoring  Essential  hypertension: BP slightly up today. -Cardiac meds as above  Acute urinary retention: Reportedly had 750 cc on I&O cath the night of 10/26.  The scan showed 550cc on 10/27.  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 -Continue Foley catheter -Continue Flomax  GERD Continue PPI.  Thrombocytopenia/normocytic anemia: Stable. -Monitor    Hyperlipidemia -Continue home Crestor  Physical deconditioning -PT/OT eval  Goal of care counseling: Full code-confirmed with patient, son and wife  Body mass index is 24.97 kg/m.           DVT prophylaxis:  Place TED hose Start: 06/19/22 1130 SCDs Start: 06/05/2022 1016 due to thrombocytopenia  Code Status: Full code Family Communication: None at bedside today Level of care: Stepdown Status is: Inpatient Remains inpatient appropriate because: Acute respiratory failure with hypoxia and hypercapnia, CHF exacerbation, COPD exacerbation, COVID-19 infection and pneumonia   Final disposition: TBD Consultants:  Pulmonology  Sch Meds:  Scheduled Meds:  arformoterol  15 mcg Nebulization BID   budesonide (PULMICORT) nebulizer solution  0.25 mg Nebulization BID   Chlorhexidine Gluconate Cloth  6 each Topical Daily   clopidogrel  75 mg Oral Daily   dapagliflozin propanediol  10 mg Oral QAC breakfast   furosemide  40 mg Intravenous Daily   guaiFENesin  1,200 mg Oral BID   methylPREDNISolone (SOLU-MEDROL) injection  81.25 mg Intravenous Daily   metoprolol succinate  75 mg Oral Daily   pantoprazole  40 mg Oral  Daily   revefenacin  175 mcg Nebulization Daily   rosuvastatin  20 mg Oral QHS   sacubitril-valsartan  1 tablet Oral BID   sodium chloride HYPERTONIC  4 mL Nebulization BID   spironolactone  25 mg Oral Daily   tamsulosin  0.4 mg Oral QPC supper   Continuous Infusions:  ampicillin-sulbactam (UNASYN) IV Stopped (06/25/22 0644)   PRN Meds:.acetaminophen **OR** acetaminophen, levalbuterol **AND** ipratropium,  menthol-cetylpyridinium, ondansetron **OR** ondansetron (ZOFRAN) IV, sodium chloride  Antimicrobials: Anti-infectives (From admission, onward)    Start     Dose/Rate Route Frequency Ordered Stop   06/23/22 0600  Ampicillin-Sulbactam (UNASYN) 3 g in sodium chloride 0.9 % 100 mL IVPB        3 g 200 mL/hr over 30 Minutes Intravenous Every 6 hours 06/22/22 1739 06/27/22 2359   06/19/22 2200  molnupiravir EUA (LAGEVRIO) capsule 800 mg        4 capsule Oral 2 times daily 06/19/22 1514 06/24/22 2159   06/19/22 1000  azithromycin (ZITHROMAX) 500 mg in sodium chloride 0.9 % 250 mL IVPB  Status:  Discontinued        500 mg 250 mL/hr over 60 Minutes Intravenous Every 24 hours 05/31/2022 1931 06/22/22 1711   06/19/22 1000  cefTRIAXone (ROCEPHIN) 1 g in sodium chloride 0.9 % 100 mL IVPB        1 g 200 mL/hr over 30 Minutes Intravenous Every 24 hours 06/26/2022 2120 06/22/22 1026   06/14/2022 2200  nirmatrelvir/ritonavir EUA (renal dosing) (PAXLOVID) 2 tablet  Status:  Discontinued       Note to Pharmacy: Please adjust or change treatment as needed.   2 tablet Oral 2 times daily 05/29/2022 1929 06/19/22 1514   06/26/2022 2030  cefTRIAXone (ROCEPHIN) 1 g in sodium chloride 0.9 % 100 mL IVPB  Status:  Discontinued        1 g 200 mL/hr over 30 Minutes Intravenous Every 24 hours 05/30/2022 1931 06/11/2022 2120   05/27/2022 1000  levofloxacin (LEVAQUIN) tablet 500 mg  Status:  Discontinued        500 mg Oral Daily 06/07/2022 0943 06/06/2022 1943        I have personally reviewed the following labs and images: CBC: Recent Labs  Lab 06/19/22 0410 06/20/22 0408 06/21/22 0410 06/22/22 0255 06/23/22 0252 06/24/22 0250 06/25/22 0300  WBC 5.7 4.9 10.0 8.3 9.5 8.4 11.2*  NEUTROABS 5.4 4.6 9.5* 7.9* 9.2*  --   --   HGB 9.9* 9.9* 10.8* 9.9* 10.0* 9.7* 10.2*  HCT 32.0* 31.9* 36.1* 31.5* 32.8* 31.5* 34.3*  MCV 86.0 85.5 87.8 85.6 86.3 86.8 88.4  PLT 68* 58* 73* 55* 50* 49* 66*   BMP &GFR Recent Labs  Lab  06/21/22 0410 06/22/22 0255 06/23/22 0252 06/24/22 0250 06/25/22 0300  NA 134* 136 141 142 141  K 3.7 3.4* 3.8 3.6 3.5  CL 92* 93* 96* 97* 95*  CO2 33* 33* 36* 37* 37*  GLUCOSE 124* 137* 144* 152* 167*  BUN 40* 36* 41* 46* 45*  CREATININE 1.78* 1.55* 1.56* 1.56* 1.59*  CALCIUM 8.1* 7.6* 8.1* 8.1* 8.3*  MG 2.6* 2.2 2.5* 2.6* 2.6*  PHOS 4.1 3.2 2.7 2.4* 3.5   Estimated Creatinine Clearance: 37 mL/min (A) (by C-G formula based on SCr of 1.59 mg/dL (H)). Liver & Pancreas: Recent Labs  Lab 06/20/22 0408 06/21/22 0410 06/22/22 0255 06/23/22 0252 06/24/22 0250 06/25/22 0300  AST 19 24 19 20 18   --   ALT 18 22 17  19  17  --   ALKPHOS 61 77 61 66 65  --   BILITOT 0.5 0.3 0.7 0.7 0.8  --   PROT 6.1* 6.9 5.9* 6.3* 6.1*  --   ALBUMIN 2.2* 2.5* 2.2* 2.3* 2.3* 2.6*   No results for input(s): "LIPASE", "AMYLASE" in the last 168 hours. No results for input(s): "AMMONIA" in the last 168 hours. Diabetic: No results for input(s): "HGBA1C" in the last 72 hours. Recent Labs  Lab 06/24/22 0759 06/24/22 1211 06/24/22 1616 06/24/22 2040 06/25/22 0840  GLUCAP 124* 149* 138* 192* 120*   Cardiac Enzymes: Recent Labs  Lab 06/19/22 2210 06/20/22 0408  CKTOTAL 21* 20*   No results for input(s): "PROBNP" in the last 8760 hours. Coagulation Profile: No results for input(s): "INR", "PROTIME" in the last 168 hours. Thyroid Function Tests: No results for input(s): "TSH", "T4TOTAL", "FREET4", "T3FREE", "THYROIDAB" in the last 72 hours. Lipid Profile: No results for input(s): "CHOL", "HDL", "LDLCALC", "TRIG", "CHOLHDL", "LDLDIRECT" in the last 72 hours. Anemia Panel: No results for input(s): "VITAMINB12", "FOLATE", "FERRITIN", "TIBC", "IRON", "RETICCTPCT" in the last 72 hours.  Urine analysis:    Component Value Date/Time   COLORURINE YELLOW 06/03/2022 0307   APPEARANCEUR CLEAR 06/03/2022 0307   LABSPEC 1.013 06/03/2022 0307   LABSPEC 1.010 03/28/2016 1244   PHURINE 5.0 06/03/2022  0307   GLUCOSEU >=500 (A) 06/03/2022 0307   GLUCOSEU Negative 03/28/2016 1244   HGBUR NEGATIVE 06/03/2022 0307   BILIRUBINUR NEGATIVE 06/03/2022 0307   BILIRUBINUR Negative 03/28/2016 [place] 06/03/2022 0307   PROTEINUR 30 (A) 06/03/2022 0307   UROBILINOGEN 0.2 03/28/2016 1244   NITRITE NEGATIVE 06/03/2022 0307   LEUKOCYTESUR NEGATIVE 06/03/2022 0307   LEUKOCYTESUR Trace 03/28/2016 1244   Sepsis Labs: Invalid input(s): "PROCALCITONIN", "LACTICIDVEN"  Microbiology: Recent Results (from the past 240 hour(s))  SARS Coronavirus 2 by RT PCR (hospital order, performed in [facility] hospital lab) *cepheid single result test* Anterior Nasal Swab     Status: Abnormal   Collection Time: 06/11/2022  5:34 PM   Specimen: Anterior Nasal Swab  Result Value Ref Range Status   SARS Coronavirus 2 by RT PCR POSITIVE (A) NEGATIVE Final    Comment: (NOTE) SARS-CoV-2 target nucleic acids are DETECTED  SARS-CoV-2 RNA is generally detectable in upper respiratory specimens  during the acute phase of infection.  Positive results are indicative  of the presence of the identified virus, but do not rule out bacterial infection or co-infection with other pathogens not detected by the test.  Clinical correlation with patient history and  other diagnostic information is necessary to determine patient infection status.  The expected result is negative.  Fact Sheet for Patients:   https://www.patel.info/   Fact Sheet for Healthcare Providers:   https://hall.com/    This test is not yet approved or cleared by the [place] FDA and  has been authorized for detection and/or diagnosis of SARS-CoV-2 by FDA under an Emergency Use Authorization (EUA).  This EUA will remain in effect (meaning this test can be used) for the duration of  the COVID-19 declaration under Section 564(b)(1)  of the Act, 21 U.S.C. section 360-bbb-3(b)(1), unless the  authorization is terminated or revoked sooner.   Performed at [facility], [place] [address], [place], [place] [address]   Expectorated Sputum Assessment w Gram Stain, Rflx to Resp Cult     Status: None   Collection Time: 06/19/22  3:34 PM   Specimen: Expectorated Sputum  Result Value Ref  Range Status   Specimen Description EXPECTORATED SPUTUM  Final   Special Requests NONE  Final   Sputum evaluation   Final    Sputum specimen not acceptable for testing.  Please recollect.   NOTIFIED SEZER,N RN AT 2103 ON 06/19/22 BY VAZQUEZJ Performed at [facility], [place] [address] [place], [place] [address]    Report Status 06/20/2022 FINAL  Final  Expectorated Sputum Assessment w Gram Stain, Rflx to Resp Cult     Status: None   Collection Time: 06/20/22 12:11 PM  Result Value Ref Range Status   Specimen Description EXPECTORATED SPUTUM  Final   Special Requests NONE  Final   Sputum evaluation   Final    Sputum specimen not acceptable for testing.  Please recollect.   Vernell Barrier, J RN @1412  ON 06/20/2022 BY Gloriann Loan Performed at [facility], [place] [address], [place], [place] [address]    Report Status 06/20/2022 FINAL  Final  MRSA Next Gen by PCR, Nasal     Status: None   Collection Time: 06/21/22  1:28 PM   Specimen: Nasal Mucosa; Nasal Swab  Result Value Ref Range Status   MRSA by PCR Next Gen NOT DETECTED NOT DETECTED Final    Comment: (NOTE) The GeneXpert MRSA Assay (FDA approved for NASAL specimens only), is one component of a comprehensive MRSA colonization surveillance program. It is not intended to diagnose MRSA infection nor to guide or monitor treatment for MRSA infections. Test performance is not FDA approved in patients less than 8 years old. Performed at [facility], [place] [address], [place], [place] [address]     Radiology Studies: No results found.    Josselin Gaulin T. [place]  If 7PM-7AM,  please contact night-coverage [email or web address] 06/25/2022, 12:44 PM

## 2022-06-26 DIAGNOSIS — D696 Thrombocytopenia, unspecified: Secondary | ICD-10-CM | POA: Diagnosis not present

## 2022-06-26 DIAGNOSIS — I5043 Acute on chronic combined systolic (congestive) and diastolic (congestive) heart failure: Secondary | ICD-10-CM | POA: Diagnosis not present

## 2022-06-26 DIAGNOSIS — N1832 Chronic kidney disease, stage 3b: Secondary | ICD-10-CM | POA: Diagnosis not present

## 2022-06-26 DIAGNOSIS — J441 Chronic obstructive pulmonary disease with (acute) exacerbation: Secondary | ICD-10-CM | POA: Diagnosis not present

## 2022-06-26 DIAGNOSIS — E876 Hypokalemia: Secondary | ICD-10-CM

## 2022-06-26 LAB — CBC
HCT: 30 % — ABNORMAL LOW (ref 39.0–52.0)
Hemoglobin: 9.2 g/dL — ABNORMAL LOW (ref 13.0–17.0)
MCH: 26.7 pg (ref 26.0–34.0)
MCHC: 30.7 g/dL (ref 30.0–36.0)
MCV: 87 fL (ref 80.0–100.0)
Platelets: 48 10*3/uL — ABNORMAL LOW (ref 150–400)
RBC: 3.45 MIL/uL — ABNORMAL LOW (ref 4.22–5.81)
RDW: 17 % — ABNORMAL HIGH (ref 11.5–15.5)
WBC: 7.1 10*3/uL (ref 4.0–10.5)
nRBC: 0 % (ref 0.0–0.2)

## 2022-06-26 LAB — RENAL FUNCTION PANEL
Albumin: 2.1 g/dL — ABNORMAL LOW (ref 3.5–5.0)
Anion gap: 9 (ref 5–15)
BUN: 44 mg/dL — ABNORMAL HIGH (ref 8–23)
CO2: 36 mmol/L — ABNORMAL HIGH (ref 22–32)
Calcium: 7.7 mg/dL — ABNORMAL LOW (ref 8.9–10.3)
Chloride: 96 mmol/L — ABNORMAL LOW (ref 98–111)
Creatinine, Ser: 1.54 mg/dL — ABNORMAL HIGH (ref 0.61–1.24)
GFR, Estimated: 45 mL/min — ABNORMAL LOW (ref >60)
Glucose, Bld: 115 mg/dL — ABNORMAL HIGH (ref 70–99)
Phosphorus: 3 mg/dL (ref 2.5–4.6)
Potassium: 3.3 mmol/L — ABNORMAL LOW (ref 3.5–5.1)
Sodium: 141 mmol/L (ref 135–145)

## 2022-06-26 LAB — MAGNESIUM: Magnesium: 2.2 mg/dL (ref 1.7–2.4)

## 2022-06-26 LAB — GLUCOSE, CAPILLARY
Glucose-Capillary: 98 mg/dL (ref 70–99)
Glucose-Capillary: 100 mg/dL — ABNORMAL HIGH (ref 70–99)
Glucose-Capillary: 194 mg/dL — ABNORMAL HIGH (ref 70–99)
Glucose-Capillary: 234 mg/dL — ABNORMAL HIGH (ref 70–99)

## 2022-06-26 LAB — BRAIN NATRIURETIC PEPTIDE: B Natriuretic Peptide: 1264.6 pg/mL — ABNORMAL HIGH (ref 0.0–100.0)

## 2022-06-26 MED ORDER — POLYETHYLENE GLYCOL 3350 17 G PO PACK: 17.0000 g | PACK | Freq: Two times a day (BID) | ORAL | Status: DC | PRN

## 2022-06-26 MED ORDER — POTASSIUM CHLORIDE CRYS ER 20 MEQ PO TBCR
40.0000 meq | EXTENDED_RELEASE_TABLET | Freq: Once | ORAL | Status: AC
  Administered 2022-06-26: 40 meq via ORAL
  Filled 2022-06-26: qty 2

## 2022-06-26 MED ORDER — POTASSIUM CHLORIDE CRYS ER 20 MEQ PO TBCR
30.0000 meq | EXTENDED_RELEASE_TABLET | Freq: Once | ORAL | Status: AC
  Administered 2022-06-26: 30 meq via ORAL
  Filled 2022-06-26: qty 1

## 2022-06-26 MED ORDER — SENNOSIDES-DOCUSATE SODIUM 8.6-50 MG PO TABS
1.0000 | ORAL_TABLET | Freq: Two times a day (BID) | ORAL | Status: DC | PRN
  Administered 2022-06-26: 1 via ORAL
  Filled 2022-06-26: qty 1

## 2022-06-26 NOTE — Progress Notes (Signed)
NAME:  Paul Craney., MRN:  [id number], DOB:  Aug 21, 1941, LOS: 7 ADMISSION DATE:  06/05/2022, CONSULTATION DATE:  10/26 REFERRING MD:  Cyndia Skeeters, CHIEF COMPLAINT:  Dyspnea   History of Present Illness:  81 y/o male admitted with acute respiratory failure with hypoxemia in the setting of COVID 19  pneumonia.  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  Pertinent  Medical History  Former smoker, COPD, Chronic HFrEF (03/2022> EF 30-35%/ G1DD/ mildly reduced RVSF)/ HFpEF, HLD, CKD 3b, anemia of chronic disease; stage IIIa NSCLC RUL treated with chemo/rad 2017   [facility] Dr. Elsworth S[facility] Events: Including procedures, antibiotic start and stop dates in addition to other pertinent events   10/23 Admitted to [facility] 10/25 Increased O2 need to heated high flow. PCCM consulted 10/26 attempted right thoracentesis, no fluid obtained 10/26 CT-PA > no PE.  Right lower lobe consolidation with mucous, obstructing material right lower lobe and right middle lobe bronchi.  Left lower lobe and lingular airspace disease.  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  Right basilar pleural 10.8 cm fluid collection  Interim History / Subjective:  Mobilizing mucous well. Remains on HFNC. Increased to 70% and 35 LPM this morning, but with current O2 sat goals we can return him to 60 % and 30 LPM. OOB in chair currently and tolerating well.   Objective   Blood pressure (!) 133/54, pulse 98, temperature (!) 97.3 F (36.3 C), temperature source Oral, resp. rate (!) 23, height 5\' 10"  (1.778 m), weight 78.2 kg, SpO2 93 %.    FiO2 (%):  [60 %-70 %] 70 %   Intake/Output Summary (Last 24 hours) at 06/26/2022 1106 Last data filed at 06/26/2022 0955 Gross per 24 hour  Intake 1333.73 ml  Output 1925 ml  Net -591.27 ml    Filed Weights   06/22/2022 0605 06/26/22 0334  Weight: 78.9 kg 78.2 kg    Examination: General: Sitting in  bedside chair HENT: NCAT OP clear PULM: Rhonchi and wheezing bilaterally, diminished RLL, normal effort CV: RRR, no mgr GI: BS+, soft, nontender MSK: normal bulk and tone Neuro: awake, alert, no distress, MAEW   Resolved Hospital Problem list     Assessment & Plan:  He is critically ill due to:  Acute on chronic combined CHF LVEF 30-35% Diuresis per primary team  COPD exacerbation in setting of COVID infection RUL Pneumonia Unasyn 5 day course per primary Could consider changing to Ceftriaxone for decreased salt load Solumedrol 1mg /kg/day Continue pulmicort/yupelri/brovana Hypertonic saline nebulized and guaifenesin  continue. Mobilizing mucous well.  Out of bed as tolerated Instructed on incentive spirometer and flutter valve use.   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 Would hold off on further thoracentesis attempts unless there is a significant change in the effusion  Stage IIIa RUL NSCLC s/p chemo/radiation in 2017 Abnormal CT chest with RLL and RML bronchial obstruction Will need bronchoscopy when oxygen needs improve  Acute on chronic respiratory failure with hypoxemia Wean heated high flow as able Tolerate periods of hypoxemia, goal at rest is greater than 85% SaO2, with movement ideally above 75% Decision for intubation should be based on a change in mental status or physical evidence of ventilatory failure such as nasal flaring, accessory muscle use, paradoxical breathing Out of bed as able Incentive spirometry is important, use every hour Maintain close monitoring in ICU setting    Best  Practice (right click and "Reselect all SmartList Selections" daily)   Per primary  Critical care time: 34 minutes     Georgann Housekeeper, AGACNP-BC [place] for personal pager PCCM on call pager [phone] until 7pm. Please call Elink 7p-7a. [phone]  06/26/2022 11:57 AM

## 2022-06-26 NOTE — Progress Notes (Signed)
PROGRESS NOTE  Paul Sandoval. DCV:[id number] DOB: 1941-05-29   PCP: Coral Spikes, DO  Patient is from: Home.  DOA: 06/04/2022 LOS: 7  Chief complaints Chief Complaint  Patient presents with   Shortness of Breath     Brief Narrative / Interim history: 81 year old M with PMH of combined CHF/ICM, CAD/CABG, COPD, CKD-3A, right lung cancer, HTN, osteoarthritis, anemia and recent hospitalization for COPD and CHF exacerbation returning with SOB, orthopnea and BLE edema and admitted for acute respiratory failure with hypoxia due to acute on chronic combined CHF,  pneumonia and COPD exacerbation.  BNP elevated to 1000 (higher than baseline).  CXR suggests LLL opacity.  Tested positive for COVID-19 as well.  Started on IV Lasix, Decadron, ceftriaxone, azithromycin and Paxlovid.   Patient was continued on IV Lasix, Decadron, ceftriaxone, azithromycin and antiviral with improvement in his respiratory symptoms.  However, he developed respiratory distress with increased oxygen requirement the night of 10/25.  PCCM consulted and he was transferred to SDU.  CTA chest negative for PE but multifocal pneumonia concerning for aspiration pneumonia obstructive endobronchial material within RLL bronchus, near occlusive material in RML bronchus, dense consolidation of the RLL and LLL, patchy airspace disease in LUL and LLL and chronic right-sided pleural collection.  Venous Doppler negative for DVT but limited study.  IR thoracocentesis was not successful.  PCCM following.  Remains on diuretics, antibiotics and steroid.  Subjective: Seen and examined earlier this morning.  No major events overnight of this morning.  Some difficulty with bowel movement although he also reports having 2 bowel movements yesterday.  He says his breathing is "okay".   Objective: Vitals:   06/26/22 1000 06/26/22 1100 06/26/22 1200 06/26/22 1237  BP: (!) 133/54 (!) 142/88  (!) 143/64  Pulse: 98 (!) 103  95  Resp: (!) 23 (!) 29   (!) 26  Temp:   (!) 97.3 F (36.3 C)   TempSrc:   Axillary   SpO2: 93% 100%  96%  Weight:      Height:        Examination:  GENERAL: No apparent distress.  Nontoxic. HEENT: MMM.  Vision grossly intact.  Diminished hearing.  Hoarse voice NECK: Supple.  No apparent JVD.  RESP:  No IWOB.  Diminished aeration in right lung. CVS:  RRR. Heart sounds normal.  ABD/GI/GU: BS+. Abd soft, NTND.  MSK/EXT:  Moves extremities. No apparent deformity. No edema.  SKIN: no apparent skin lesion or wound NEURO: Awake and alert. Oriented appropriately.  No apparent focal neuro deficit. PSYCH: Calm. Normal affect.  Procedures:  None  Microbiology summarized: COVID-19 PCR positive.  Assessment and plan: Principal Problem:   Acute on chronic combined systolic and diastolic congestive heart failure (HCC) Active Problems:   HLD (hyperlipidemia)   Essential hypertension   GERD (gastroesophageal reflux disease)   CAD (coronary artery disease)   Thrombocytopenia (HCC)   Stage 3b chronic kidney disease (CKD) ([place])   Primary cancer of right upper lobe of lung (HCC)   Goals of care, counseling/discussion   Pleural effusion on right   Cardiomyopathy, ischemic   History of endovascular stent graft for abdominal aortic aneurysm (AAA)   Acute exacerbation of chronic obstructive pulmonary disease (COPD) (HCC)   Acute respiratory failure with hypoxia (HCC)   Anemia of chronic kidney failure   COVID-19 virus infection   Aspiration pneumonia (HCC)   Acute urinary retention  Acute respiratory failure with hypoxia and hypercarbia: Multifactorial including aspiration multifocal pneumonia, pleural effusion, acute  CHF, COPD exacerbation, COVID-19 infection and CHF.  CTA chest negative for PE but multifocal pneumonia raising concern for aspiration, RLL and RML bronchial obstruction, chronic right pleural effusion.  Increased oxygen requirement.  IR thoracocentesis unsuccessful.  No significant respiratory  distress.  Oxygen requirement improving.  He is full code. -PCCM following. -I agree with permissive hypoxemia per PCCM -CTX and Zithromax 10/23 to 10/27 >>>Unasyn 10/27>>> for 5 more days -Continue IV Solu-Medrol 80 mg twice daily -Continue scheduled and as needed nebs -Aspiration precaution -Appreciate evaluation and help by SLP -Incentive spirometry, OOB, PT/OT  Acute on chronic combined CHF: Presents with SOB, orthopnea and BLE edema.  BNP evaded to 900s.  JVD, BNP and edema improving.  TTE in 03/2022 with LVEF of 30 to 35%, GH, G1-DD and mildly reduced RVSF.  Patient reports compliance with Farxiga, Lasix, diet and salt intake.  I and O incomplete but net -5 L.  Cr improved.  BP stable. -Continue IV Lasix to 40 mg twice daily -Continue Farxiga -Continue Aldactone, Toprol-XL and low-dose Entresto. -Strict intake and output, renal functions and electrolytes -Continue TED hose and leg elevation  Acute exacerbation of chronic obstructive pulmonary disease: Due to COVID and pneumonia. -Continue IV Solu-Medrol, antibiotics and nebulizers -BiPAP as needed  COVID-19 infection: Fully vaccinated. Pro-Cal 0.19.  CRP improved but the D-dimer trended up. -Management as above -Continue isolation precaution  History of CAD s/p CABG in 1996 and PCI to dLM and SVG-D in 02/2014: Mildly elevated troponin likely demand ischemia.  No chest pain. -Continue home meds-Plavix and Crestor -Continue Toprol-XL -Outpatient follow-up with cardiology  Sinus tachycardia/PVCs: EKG NSR with RBBB. -Toprol-XL as above -Optimize electrolytes  CKD-3B/hyponatremia: Stable. Recent Labs    06/07/22 0405 05/31/2022 8099 06/19/22 0410 06/20/22 0408 06/21/22 0410 06/22/22 0255 06/23/22 0252 06/24/22 0250 06/25/22 0300 06/26/22 0255  BUN 44* 25* 29* 35* 40* 36* 41* 46* 45* 44*  CREATININE 1.73* 1.76* 1.83* 1.79* 1.78* 1.55* 1.56* 1.56* 1.59* 1.54*  -Continue monitoring  Essential hypertension: BP slightly up  today. -Cardiac meds as above  Acute urinary retention: Reportedly had 750 cc on I&O cath the night of 10/26.  The scan showed 550cc on 10/27.  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 -Continue Foley catheter -Continue Flomax -Voiding trial prior to discharge  Hypokalemia -Monitor and replenish as appropriate -Continue Aldactone  GERD Continue PPI.  Thrombocytopenia/normocytic anemia: Relatively stable. -Monitor    Hyperlipidemia -Continue home Crestor  Physical deconditioning -PT/OT eval  Goal of care counseling: Full code-confirmed with patient, son and wife  Body mass index is 24.74 kg/m.           DVT prophylaxis:  Place TED hose Start: 06/19/22 1130 SCDs Start: 06/06/2022 1016 due to thrombocytopenia  Code Status: Full code Family Communication: None at bedside today Level of care: Stepdown Status is: Inpatient Remains inpatient appropriate because: Acute respiratory failure with hypoxia and hypercapnia, CHF exacerbation, COPD exacerbation, COVID-19 infection and pneumonia   Final disposition: TBD Consultants:  Pulmonology  Sch Meds:  Scheduled Meds:  arformoterol  15 mcg Nebulization BID   budesonide (PULMICORT) nebulizer solution  0.25 mg Nebulization BID   Chlorhexidine Gluconate Cloth  6 each Topical Daily   clopidogrel  75 mg Oral Daily   dapagliflozin propanediol  10 mg Oral QAC breakfast   furosemide  40 mg Intravenous BID   guaiFENesin  1,200 mg Oral BID   methylPREDNISolone (SOLU-MEDROL) injection  81.25 mg Intravenous Daily   metoprolol succinate  75  mg Oral Daily   pantoprazole  40 mg Oral Daily   revefenacin  175 mcg Nebulization Daily   rosuvastatin  20 mg Oral QHS   sacubitril-valsartan  1 tablet Oral BID   sodium chloride HYPERTONIC  4 mL Nebulization BID   spironolactone  25 mg Oral Daily   tamsulosin  0.4 mg Oral QPC supper   Continuous Infusions:  ampicillin-sulbactam (UNASYN) IV Stopped (06/26/22 1243)   PRN  Meds:.acetaminophen **OR** acetaminophen, levalbuterol **AND** ipratropium, menthol-cetylpyridinium, ondansetron **OR** ondansetron (ZOFRAN) IV, polyethylene glycol, senna-docusate, sodium chloride  Antimicrobials: Anti-infectives (From admission, onward)    Start     Dose/Rate Route Frequency Ordered Stop   06/23/22 0600  Ampicillin-Sulbactam (UNASYN) 3 g in sodium chloride 0.9 % 100 mL IVPB        3 g 200 mL/hr over 30 Minutes Intravenous Every 6 hours 06/22/22 1739 06/27/22 2359   06/19/22 2200  molnupiravir EUA (LAGEVRIO) capsule 800 mg        4 capsule Oral 2 times daily 06/19/22 1514 06/24/22 2159   06/19/22 1000  azithromycin (ZITHROMAX) 500 mg in sodium chloride 0.9 % 250 mL IVPB  Status:  Discontinued        500 mg 250 mL/hr over 60 Minutes Intravenous Every 24 hours 06/24/2022 1931 06/22/22 1711   06/19/22 1000  cefTRIAXone (ROCEPHIN) 1 g in sodium chloride 0.9 % 100 mL IVPB        1 g 200 mL/hr over 30 Minutes Intravenous Every 24 hours 06/14/2022 2120 06/22/22 1026   06/01/2022 2200  nirmatrelvir/ritonavir EUA (renal dosing) (PAXLOVID) 2 tablet  Status:  Discontinued       Note to Pharmacy: Please adjust or change treatment as needed.   2 tablet Oral 2 times daily 05/28/2022 1929 06/19/22 1514   05/31/2022 2030  cefTRIAXone (ROCEPHIN) 1 g in sodium chloride 0.9 % 100 mL IVPB  Status:  Discontinued        1 g 200 mL/hr over 30 Minutes Intravenous Every 24 hours 06/22/2022 1931 06/13/2022 2120   06/14/2022 1000  levofloxacin (LEVAQUIN) tablet 500 mg  Status:  Discontinued        500 mg Oral Daily 06/07/2022 0943 05/27/2022 1943        I have personally reviewed the following labs and images: CBC: Recent Labs  Lab 06/20/22 0408 06/21/22 0410 06/22/22 0255 06/23/22 0252 06/24/22 0250 06/25/22 0300 06/26/22 0255  WBC 4.9 10.0 8.3 9.5 8.4 11.2* 7.1  NEUTROABS 4.6 9.5* 7.9* 9.2*  --   --   --   HGB 9.9* 10.8* 9.9* 10.0* 9.7* 10.2* 9.2*  HCT 31.9* 36.1* 31.5* 32.8* 31.5* 34.3* 30.0*  MCV  85.5 87.8 85.6 86.3 86.8 88.4 87.0  PLT 58* 73* 55* 50* 49* 66* 48*   BMP &GFR Recent Labs  Lab 06/22/22 0255 06/23/22 0252 06/24/22 0250 06/25/22 0300 06/26/22 0255  NA 136 141 142 141 141  K 3.4* 3.8 3.6 3.5 3.3*  CL 93* 96* 97* 95* 96*  CO2 33* 36* 37* 37* 36*  GLUCOSE 137* 144* 152* 167* 115*  BUN 36* 41* 46* 45* 44*  CREATININE 1.55* 1.56* 1.56* 1.59* 1.54*  CALCIUM 7.6* 8.1* 8.1* 8.3* 7.7*  MG 2.2 2.5* 2.6* 2.6* 2.2  PHOS 3.2 2.7 2.4* 3.5 3.0   Estimated Creatinine Clearance: 38.2 mL/min (A) (by C-G formula based on SCr of 1.54 mg/dL (H)). Liver & Pancreas: Recent Labs  Lab 06/20/22 0408 06/21/22 0410 06/22/22 0255 06/23/22 0252 06/24/22 0250 06/25/22 0300 06/26/22  0255  AST 19 24 19 20 18   --   --   ALT 18 22 17 19 17   --   --   ALKPHOS 61 77 61 66 65  --   --   BILITOT 0.5 0.3 0.7 0.7 0.8  --   --   PROT 6.1* 6.9 5.9* 6.3* 6.1*  --   --   ALBUMIN 2.2* 2.5* 2.2* 2.3* 2.3* 2.6* 2.1*   No results for input(s): "LIPASE", "AMYLASE" in the last 168 hours. No results for input(s): "AMMONIA" in the last 168 hours. Diabetic: No results for input(s): "HGBA1C" in the last 72 hours. Recent Labs  Lab 06/25/22 1304 06/25/22 1825 06/25/22 2108 06/26/22 0805 06/26/22 1206  GLUCAP 114* 128* 150* 98 100*   Cardiac Enzymes: Recent Labs  Lab 06/19/22 2210 06/20/22 0408  CKTOTAL 21* 20*   No results for input(s): "PROBNP" in the last 8760 hours. Coagulation Profile: No results for input(s): "INR", "PROTIME" in the last 168 hours. Thyroid Function Tests: No results for input(s): "TSH", "T4TOTAL", "FREET4", "T3FREE", "THYROIDAB" in the last 72 hours. Lipid Profile: No results for input(s): "CHOL", "HDL", "LDLCALC", "TRIG", "CHOLHDL", "LDLDIRECT" in the last 72 hours. Anemia Panel: No results for input(s): "VITAMINB12", "FOLATE", "FERRITIN", "TIBC", "IRON", "RETICCTPCT" in the last 72 hours.  Urine analysis:    Component Value Date/Time   COLORURINE YELLOW  06/03/2022 0307   APPEARANCEUR CLEAR 06/03/2022 0307   LABSPEC 1.013 06/03/2022 0307   LABSPEC 1.010 03/28/2016 1244   PHURINE 5.0 06/03/2022 0307   GLUCOSEU >=500 (A) 06/03/2022 0307   GLUCOSEU Negative 03/28/2016 1244   HGBUR NEGATIVE 06/03/2022 0307   BILIRUBINUR NEGATIVE 06/03/2022 0307   BILIRUBINUR Negative 03/28/2016 [place] 06/03/2022 0307   PROTEINUR 30 (A) 06/03/2022 0307   UROBILINOGEN 0.2 03/28/2016 1244   NITRITE NEGATIVE 06/03/2022 0307   LEUKOCYTESUR NEGATIVE 06/03/2022 0307   LEUKOCYTESUR Trace 03/28/2016 1244   Sepsis Labs: Invalid input(s): "PROCALCITONIN", "LACTICIDVEN"  Microbiology: Recent Results (from the past 240 hour(s))  SARS Coronavirus 2 by RT PCR (hospital order, performed in [facility] hospital lab) *cepheid single result test* Anterior Nasal Swab     Status: Abnormal   Collection Time: 05/28/2022  5:34 PM   Specimen: Anterior Nasal Swab  Result Value Ref Range Status   SARS Coronavirus 2 by RT PCR POSITIVE (A) NEGATIVE Final    Comment: (NOTE) SARS-CoV-2 target nucleic acids are DETECTED  SARS-CoV-2 RNA is generally detectable in upper respiratory specimens  during the acute phase of infection.  Positive results are indicative  of the presence of the identified virus, but do not rule out bacterial infection or co-infection with other pathogens not detected by the test.  Clinical correlation with patient history and  other diagnostic information is necessary to determine patient infection status.  The expected result is negative.  Fact Sheet for Patients:   https://www.patel.info/   Fact Sheet for Healthcare Providers:   https://hall.com/    This test is not yet approved or cleared by the [place] FDA and  has been authorized for detection and/or diagnosis of SARS-CoV-2 by FDA under an Emergency Use Authorization (EUA).  This EUA will remain in effect (meaning this test can be  used) for the duration of  the COVID-19 declaration under Section 564(b)(1)  of the Act, 21 U.S.C. section 360-bbb-3(b)(1), unless the authorization is terminated or revoked sooner.   Performed at [facility], [place] [address], [place], [place] [address]  Expectorated Sputum Assessment w Gram Stain, Rflx to Resp Cult     Status: None   Collection Time: 06/19/22  3:34 PM   Specimen: Expectorated Sputum  Result Value Ref Range Status   Specimen Description EXPECTORATED SPUTUM  Final   Special Requests NONE  Final   Sputum evaluation   Final    Sputum specimen not acceptable for testing.  Please recollect.   NOTIFIED SEZER,N RN AT 2103 ON 06/19/22 BY VAZQUEZJ Performed at [facility], [place] [address] [place], [place] [address]    Report Status 06/20/2022 FINAL  Final  Expectorated Sputum Assessment w Gram Stain, Rflx to Resp Cult     Status: None   Collection Time: 06/20/22 12:11 PM  Result Value Ref Range Status   Specimen Description EXPECTORATED SPUTUM  Final   Special Requests NONE  Final   Sputum evaluation   Final    Sputum specimen not acceptable for testing.  Please recollect.   Vernell Barrier, J RN @1412  ON 06/20/2022 BY Gloriann Loan Performed at [facility], [place] [address], [place], [place] [address]    Report Status 06/20/2022 FINAL  Final  MRSA Next Gen by PCR, Nasal     Status: None   Collection Time: 06/21/22  1:28 PM   Specimen: Nasal Mucosa; Nasal Swab  Result Value Ref Range Status   MRSA by PCR Next Gen NOT DETECTED NOT DETECTED Final    Comment: (NOTE) The GeneXpert MRSA Assay (FDA approved for NASAL specimens only), is one component of a comprehensive MRSA colonization surveillance program. It is not intended to diagnose MRSA infection nor to guide or monitor treatment for MRSA infections. Test performance is not FDA approved in patients less than 87 years old. Performed at [facility], [place]  [address], [place], [place] [address]     Radiology Studies: No results found.    Jiaire [place] [address] [place]  If 7PM-7AM, please contact night-coverage [email or web address] 06/26/2022, 1:37 PM

## 2022-06-27 ENCOUNTER — Inpatient Hospital Stay (HOSPITAL_COMMUNITY): Payer: Medicare Other

## 2022-06-27 DIAGNOSIS — I5043 Acute on chronic combined systolic (congestive) and diastolic (congestive) heart failure: Secondary | ICD-10-CM | POA: Diagnosis not present

## 2022-06-27 DIAGNOSIS — J189 Pneumonia, unspecified organism: Secondary | ICD-10-CM

## 2022-06-27 LAB — CBC
HCT: 31.8 % — ABNORMAL LOW (ref 39.0–52.0)
Hemoglobin: 9.8 g/dL — ABNORMAL LOW (ref 13.0–17.0)
MCH: 27 pg (ref 26.0–34.0)
MCHC: 30.8 g/dL (ref 30.0–36.0)
MCV: 87.6 fL (ref 80.0–100.0)
Platelets: 56 10*3/uL — ABNORMAL LOW (ref 150–400)
RBC: 3.63 MIL/uL — ABNORMAL LOW (ref 4.22–5.81)
RDW: 16.5 % — ABNORMAL HIGH (ref 11.5–15.5)
WBC: 8.2 10*3/uL (ref 4.0–10.5)
nRBC: 0 % (ref 0.0–0.2)

## 2022-06-27 LAB — BLOOD GAS, ARTERIAL
Acid-Base Excess: 17.7 mmol/L — ABNORMAL HIGH (ref 0.0–2.0)
Bicarbonate: 41.2 mmol/L — ABNORMAL HIGH (ref 20.0–28.0)
Drawn by: 331471
FIO2: 100 %
MECHVT: 380 mL
O2 Saturation: 100 %
PEEP: 5 cmH2O
Patient temperature: 37
RATE: 16 resp/min
pCO2 arterial: 42 mmHg (ref 32–48)
pH, Arterial: 7.6 — ABNORMAL HIGH (ref 7.35–7.45)
pO2, Arterial: 222 mmHg — ABNORMAL HIGH (ref 83–108)

## 2022-06-27 LAB — RENAL FUNCTION PANEL
Albumin: 2.1 g/dL — ABNORMAL LOW (ref 3.5–5.0)
Anion gap: 9 (ref 5–15)
BUN: 42 mg/dL — ABNORMAL HIGH (ref 8–23)
CO2: 38 mmol/L — ABNORMAL HIGH (ref 22–32)
Calcium: 7.9 mg/dL — ABNORMAL LOW (ref 8.9–10.3)
Chloride: 96 mmol/L — ABNORMAL LOW (ref 98–111)
Creatinine, Ser: 1.7 mg/dL — ABNORMAL HIGH (ref 0.61–1.24)
GFR, Estimated: 40 mL/min — ABNORMAL LOW (ref >60)
Glucose, Bld: 110 mg/dL — ABNORMAL HIGH (ref 70–99)
Phosphorus: 2.8 mg/dL (ref 2.5–4.6)
Potassium: 4.1 mmol/L (ref 3.5–5.1)
Sodium: 143 mmol/L (ref 135–145)

## 2022-06-27 LAB — MAGNESIUM
Magnesium: 2.2 mg/dL (ref 1.7–2.4)
Magnesium: 2 mg/dL (ref 1.7–2.4)

## 2022-06-27 LAB — BRAIN NATRIURETIC PEPTIDE: B Natriuretic Peptide: 1166.3 pg/mL — ABNORMAL HIGH (ref 0.0–100.0)

## 2022-06-27 LAB — GLUCOSE, CAPILLARY
Glucose-Capillary: 101 mg/dL — ABNORMAL HIGH (ref 70–99)
Glucose-Capillary: 115 mg/dL — ABNORMAL HIGH (ref 70–99)
Glucose-Capillary: 98 mg/dL (ref 70–99)

## 2022-06-27 LAB — C-REACTIVE PROTEIN: CRP: 10.8 mg/dL — ABNORMAL HIGH (ref <1.0)

## 2022-06-27 LAB — D-DIMER, QUANTITATIVE: D-Dimer, Quant: 17.65 ug/mL-FEU — ABNORMAL HIGH (ref 0.00–0.50)

## 2022-06-27 MED ORDER — FENTANYL CITRATE PF 50 MCG/ML IJ SOSY
PREFILLED_SYRINGE | INTRAMUSCULAR | Status: AC
  Administered 2022-06-27: 50 ug via INTRAVENOUS
  Filled 2022-06-27: qty 2

## 2022-06-27 MED ORDER — ROSUVASTATIN CALCIUM 20 MG PO TABS
20.0000 mg | ORAL_TABLET | Freq: Every day | ORAL | Status: DC
  Administered 2022-06-27 – 2022-06-28 (×2): 20 mg
  Filled 2022-06-27: qty 1

## 2022-06-27 MED ORDER — FENTANYL 2500MCG IN NS 250ML (10MCG/ML) PREMIX INFUSION
INTRAVENOUS | Status: AC
  Administered 2022-06-27: 50 ug/h via INTRAVENOUS
  Filled 2022-06-27: qty 250

## 2022-06-27 MED ORDER — HYDRALAZINE HCL 20 MG/ML IJ SOLN: 10.0000 mg | INTRAMUSCULAR | Status: DC | PRN

## 2022-06-27 MED ORDER — TRAZODONE HCL 50 MG PO TABS: 50.0000 mg | ORAL_TABLET | Freq: Every evening | ORAL | Status: DC | PRN

## 2022-06-27 MED ORDER — SODIUM CHLORIDE 0.9 % IV SOLN
250.0000 mL | INTRAVENOUS | Status: DC
  Administered 2022-06-27 – 2022-07-06 (×3): 250 mL via INTRAVENOUS

## 2022-06-27 MED ORDER — FAMOTIDINE 20 MG PO TABS
20.0000 mg | ORAL_TABLET | Freq: Every day | ORAL | Status: DC
  Administered 2022-06-28 – 2022-06-29 (×2): 20 mg
  Filled 2022-06-27 (×2): qty 1

## 2022-06-27 MED ORDER — FENTANYL CITRATE PF 50 MCG/ML IJ SOSY: 25.0000 ug | PREFILLED_SYRINGE | Freq: Once | INTRAMUSCULAR | Status: AC

## 2022-06-27 MED ORDER — MIDAZOLAM HCL 2 MG/2ML IJ SOLN
1.0000 mg | INTRAMUSCULAR | Status: DC | PRN
  Administered 2022-06-29: 2 mg via INTRAVENOUS
  Filled 2022-06-27: qty 2

## 2022-06-27 MED ORDER — ORAL CARE MOUTH RINSE
15.0000 mL | OROMUCOSAL | Status: DC
  Administered 2022-06-27 – 2022-06-28 (×17): 15 mL via OROMUCOSAL

## 2022-06-27 MED ORDER — ETOMIDATE 2 MG/ML IV SOLN
INTRAVENOUS | Status: AC
  Administered 2022-06-27: 20 mg
  Filled 2022-06-27: qty 20

## 2022-06-27 MED ORDER — PHENYLEPHRINE 80 MCG/ML (10ML) SYRINGE FOR IV PUSH (FOR BLOOD PRESSURE SUPPORT)
PREFILLED_SYRINGE | INTRAVENOUS | Status: AC
  Administered 2022-06-27: 80 ug
  Filled 2022-06-27: qty 10

## 2022-06-27 MED ORDER — FENTANYL CITRATE PF 50 MCG/ML IJ SOSY: 25.0000 ug | PREFILLED_SYRINGE | INTRAMUSCULAR | Status: DC | PRN

## 2022-06-27 MED ORDER — DEXMEDETOMIDINE HCL IN NACL 200 MCG/50ML IV SOLN: 0.0000 ug/kg/h | INTRAVENOUS | Status: DC

## 2022-06-27 MED ORDER — SPIRONOLACTONE 25 MG PO TABS
25.0000 mg | ORAL_TABLET | Freq: Every day | ORAL | Status: DC
  Administered 2022-06-28: 25 mg
  Filled 2022-06-27: qty 1

## 2022-06-27 MED ORDER — PROPOFOL 1000 MG/100ML IV EMUL
INTRAVENOUS | Status: AC
  Administered 2022-06-27: 5 ug/kg/min via INTRAVENOUS
  Filled 2022-06-27: qty 100

## 2022-06-27 MED ORDER — ENOXAPARIN SODIUM 30 MG/0.3ML IJ SOSY
30.0000 mg | PREFILLED_SYRINGE | INTRAMUSCULAR | Status: DC
  Administered 2022-06-27 – 2022-07-03 (×7): 30 mg via SUBCUTANEOUS
  Filled 2022-06-27 (×8): qty 0.3

## 2022-06-27 MED ORDER — FENTANYL 2500MCG IN NS 250ML (10MCG/ML) PREMIX INFUSION
25.0000 ug/h | INTRAVENOUS | Status: DC
  Administered 2022-06-28: 100 ug/h via INTRAVENOUS
  Filled 2022-06-27: qty 250

## 2022-06-27 MED ORDER — DOCUSATE SODIUM 50 MG/5ML PO LIQD
100.0000 mg | Freq: Two times a day (BID) | ORAL | Status: DC
  Administered 2022-06-27 – 2022-06-29 (×4): 100 mg
  Filled 2022-06-27 (×4): qty 10

## 2022-06-27 MED ORDER — SACUBITRIL-VALSARTAN 24-26 MG PO TABS
1.0000 | ORAL_TABLET | Freq: Two times a day (BID) | ORAL | Status: DC
  Administered 2022-06-28 – 2022-06-29 (×3): 1
  Filled 2022-06-27 (×4): qty 1

## 2022-06-27 MED ORDER — ORAL CARE MOUTH RINSE: 15.0000 mL | OROMUCOSAL | Status: DC | PRN

## 2022-06-27 MED ORDER — NOREPINEPHRINE 4 MG/250ML-% IV SOLN
2.0000 ug/min | INTRAVENOUS | Status: DC
  Administered 2022-06-27: 2 ug/min via INTRAVENOUS
  Filled 2022-06-27: qty 250

## 2022-06-27 MED ORDER — METOPROLOL TARTRATE 5 MG/5ML IV SOLN: 5.0000 mg | INTRAVENOUS | Status: DC | PRN

## 2022-06-27 MED ORDER — DOCUSATE SODIUM 100 MG PO CAPS
100.0000 mg | ORAL_CAPSULE | Freq: Two times a day (BID) | ORAL | Status: DC
  Administered 2022-06-27: 100 mg via ORAL
  Filled 2022-06-27: qty 1

## 2022-06-27 MED ORDER — SACUBITRIL-VALSARTAN 24-26 MG PO TABS
1.0000 | ORAL_TABLET | Freq: Two times a day (BID) | ORAL | Status: DC
  Administered 2022-06-27: 1

## 2022-06-27 MED ORDER — GUAIFENESIN 100 MG/5ML PO LIQD
10.0000 mL | Freq: Two times a day (BID) | ORAL | Status: DC
  Administered 2022-06-27 – 2022-06-29 (×4): 10 mL
  Filled 2022-06-27 (×4): qty 10

## 2022-06-27 MED ORDER — ATROPINE SULFATE 1 MG/10ML IJ SOSY
PREFILLED_SYRINGE | INTRAMUSCULAR | Status: AC
  Filled 2022-06-27: qty 10

## 2022-06-27 MED ORDER — FENTANYL CITRATE (PF) 100 MCG/2ML IJ SOLN
INTRAMUSCULAR | Status: AC
  Filled 2022-06-27: qty 2

## 2022-06-27 MED ORDER — ROCURONIUM BROMIDE 10 MG/ML (PF) SYRINGE
PREFILLED_SYRINGE | INTRAVENOUS | Status: AC
  Filled 2022-06-27: qty 10

## 2022-06-27 MED ORDER — PROPOFOL 1000 MG/100ML IV EMUL
5.0000 ug/kg/min | INTRAVENOUS | Status: DC
  Administered 2022-06-28: 5 ug/kg/min via INTRAVENOUS
  Filled 2022-06-27 (×2): qty 100

## 2022-06-27 MED ORDER — POLYETHYLENE GLYCOL 3350 17 G PO PACK
17.0000 g | PACK | Freq: Every day | ORAL | Status: DC
  Administered 2022-06-28 – 2022-06-29 (×2): 17 g
  Filled 2022-06-27 (×2): qty 1

## 2022-06-27 MED ORDER — CLOPIDOGREL BISULFATE 75 MG PO TABS
75.0000 mg | ORAL_TABLET | Freq: Every day | ORAL | Status: DC
  Administered 2022-06-28 – 2022-06-29 (×2): 75 mg
  Filled 2022-06-27 (×2): qty 1

## 2022-06-27 MED ORDER — FENTANYL BOLUS VIA INFUSION
25.0000 ug | INTRAVENOUS | Status: DC | PRN
  Administered 2022-06-27 – 2022-06-29 (×11): 100 ug via INTRAVENOUS

## 2022-06-27 MED ORDER — MIDAZOLAM HCL 2 MG/2ML IJ SOLN
INTRAMUSCULAR | Status: AC
  Administered 2022-06-27: 2 mg
  Filled 2022-06-27: qty 2

## 2022-06-27 NOTE — Progress Notes (Signed)
PROGRESS NOTE    Paul Sandoval.  KAJ:[id number] DOB: 11-22-1940 DOA: 06/21/2022 PCP: Coral Spikes, DO   Brief Narrative:  81 year old M with PMH of combined CHF/ICM, CAD/CABG, COPD, CKD-3A, right lung cancer, HTN, osteoarthritis, anemia and recent hospitalization for COPD and CHF exacerbation returning with SOB, orthopnea and BLE edema and admitted for acute respiratory failure with hypoxia due to acute on chronic combined CHF,  pneumonia and COPD exacerbation.  BNP elevated to 1000 (higher than baseline).  CXR suggests LLL opacity.  Tested positive for COVID-19 as well.  Started on IV Lasix, Decadron, ceftriaxone, azithromycin and Paxlovid. However, he developed respiratory distress with increased oxygen requirement the night of 10/25.  PCCM consulted and he was transferred to SDU.  CTA chest negative for PE but multifocal pneumonia concerning for aspiration pneumonia obstructive endobronchial material within RLL bronchus, near occlusive material in RML bronchus, dense consolidation of the RLL and LLL, patchy airspace disease in LUL and LLL and chronic right-sided pleural collection.  Venous Doppler negative for DVT but limited study.  IR thoracocentesis was not successful. PCCM following.  Remains on diuretics, antibiotics and steroid.   Assessment & Plan:  Principal Problem:   Acute on chronic combined systolic and diastolic congestive heart failure (HCC) Active Problems:   HLD (hyperlipidemia)   Essential hypertension   GERD (gastroesophageal reflux disease)   CAD (coronary artery disease)   Thrombocytopenia (HCC)   Stage 3b chronic kidney disease (CKD) (HCC)   Primary cancer of right upper lobe of lung (HCC)   Goals of care, counseling/discussion   Pleural effusion on right   Cardiomyopathy, ischemic   History of endovascular stent graft for abdominal aortic aneurysm (AAA)   Acute exacerbation of chronic obstructive pulmonary disease (COPD) (HCC)   Acute respiratory failure with  hypoxia (HCC)   Anemia of chronic kidney failure   COVID-19 virus infection   Aspiration pneumonia (HCC)   Acute urinary retention     Acute respiratory failure with hypoxia and hypercarbia:  -Patient has multi factorial issues including pleural effusion, CHF, COPD and COVID-19.  CTA chest was negative for PE but raising concerns for multifocal pneumonia and bronchial obstruction with chronic pleural effusion.  IR thoracentesis was unsuccessful, PCCM was consulted who is recommending continuing steroids, antibiotics and diuretics at this time.  Will order aggressive bronchodilators, I-S/flutter valve, out of bed to chair.  Due to worsening breathing status, plans to intubate PER PCCM    Acute on chronic combined CHF:  -BNP still remains elevated.  Echocardiogram in May 2023 showed EF of 35%, grade 1 DD and reduced RVEF.  Currently on Lasix IV 40 mg twice daily, Farxiga, Aldactone, Toprol-XL and low-dose Entresto.  Monitor electrolytes and replete -Advised to elevate legs to help with the swelling   Acute exacerbation of chronic obstructive pulmonary disease:  -From COVID-19 infection.  Bronchodilators, antibiotics and Solu-Medrol.   COVID-19 infection:  -Continue isolation.  Completed course of molnupiravir, Rocephin, azithromycin and Unasyn.  Initially was on dexamethasone now on Solu-Medrol.   History of CAD s/p CABG in 1996 and PCI to dLM and SVG-D in 02/2014:  Denies any chest pain.  Continue Plavix, Toprol, Crestor   Sinus tachycardia/PVCs:  EKG NSR with RBBB.  Monitor and optimize electrolytes   CKD-3B/hyponatremia:  -Creatinine around baseline of 1.7   Essential hypertension:  -Continue current medications.  IV as needed ordered   Acute urinary retention:  Due to persistent urinary retention, Foley catheter is in place.  Continue Flomax.  Once his breathing symptoms have improved, voiding trial will be appropriate   Hypokalemia -Replete as needed   GERD Continue PPI.    Thrombocytopenia/normocytic anemia: Relatively stable. -Monitor, will try subcu Lovenox for DVT prophylaxis this is a high risk of thromboembolic disease.  We will continue to monitor platelets   Hyperlipidemia -Continue home Crestor   Physical deconditioning -PT/OT eval-outpatient   Goal of care counseling: Full code-confirmed with patient, son and wife   Body mass index is 24.74 kg/m  Patient will be transferred to [facility] team.     DVT prophylaxis:  Although patient has thrombocytopenia, will try Lovenox as he is at high risk of thromboembolic disease   Code Status: Full code Family Communication: None at bedside today Level of care: Stepdown Status is: Inpatient Remains inpatient appropriate because: Acute respiratory failure with hypoxia and hypercapnia, CHF exacerbation, COPD exacerbation, COVID-19 infection and pneumonia     Subjective: During my visit patient appeared short of breath with bilateral diminished breath sounds.  He was getting breathing treatment but overall appeared dyspneic.   Examination: Constitutional: Not in acute distress, nonrebreather, chronically ill Respiratory: Bilateral diminished breath sounds Cardiovascular: Normal sinus rhythm, no rubs Abdomen: Nontender nondistended good bowel sounds Musculoskeletal: No edema noted Skin: No rashes seen Neurologic: CN 2-12 grossly intact.  And nonfocal Psychiatric: Normal judgment and insight. 81 Alert and oriented x 3. Normal mood.   Objective: Vitals:   06/27/22 0300 06/27/22 0400 06/27/22 0500 06/27/22 0600  BP: (!) 121/47   (!) 152/49  Pulse: 97 96 100 (!) 53  Resp: (!) 24 (!) 23 (!) 26 (!) 21  Temp:  (!) 97 F (36.1 C)    TempSrc:  Oral    SpO2: 98% 98% (!) 87% 98%  Weight:   77.3 kg   Height:        Intake/Output Summary (Last 24 hours) at 06/27/2022 0743 Last data filed at 06/27/2022 0600 Gross per 24 hour  Intake 1316.76 ml  Output 2285 ml  Net -968.24 ml   Filed Weights    06/16/2022 0605 06/26/22 0334 06/27/22 0500  Weight: 78.9 kg 78.2 kg 77.3 kg     Data Reviewed:   CBC: Recent Labs  Lab 06/21/22 0410 06/22/22 0255 06/23/22 0252 06/24/22 0250 06/25/22 0300 06/26/22 0255 06/27/22 0249  WBC 10.0 8.3 9.5 8.4 11.2* 7.1 8.2  NEUTROABS 9.5* 7.9* 9.2*  --   --   --   --   HGB 10.8* 9.9* 10.0* 9.7* 10.2* 9.2* 9.8*  HCT 36.1* 31.5* 32.8* 31.5* 34.3* 30.0* 31.8*  MCV 87.8 85.6 86.3 86.8 88.4 87.0 87.6  PLT 73* 55* 50* 49* 66* 48* 56*   Basic Metabolic Panel: Recent Labs  Lab 06/23/22 0252 06/24/22 0250 06/25/22 0300 06/26/22 0255 06/27/22 0243 06/27/22 0249  NA 141 142 141 141 143  --   K 3.8 3.6 3.5 3.3* 4.1  --   CL 96* 97* 95* 96* 96*  --   CO2 36* 37* 37* 36* 38*  --   GLUCOSE 144* 152* 167* 115* 110*  --   BUN 41* 46* 45* 44* 42*  --   CREATININE 1.56* 1.56* 1.59* 1.54* 1.70*  --   CALCIUM 8.1* 8.1* 8.3* 7.7* 7.9*  --   MG 2.5* 2.6* 2.6* 2.2  --  2.2  PHOS 2.7 2.4* 3.5 3.0 2.8  --    GFR: Estimated Creatinine Clearance: 34.6 mL/min (A) (by C-G formula based on SCr of 1.7 mg/dL (H)). Liver Function Tests: Recent  Labs  Lab 06/21/22 0410 06/22/22 0255 06/23/22 0252 06/24/22 0250 06/25/22 0300 06/26/22 0255 06/27/22 0243  AST 24 19 20 18   --   --   --   ALT 22 17 19 17   --   --   --   ALKPHOS 77 61 66 65  --   --   --   BILITOT 0.3 0.7 0.7 0.8  --   --   --   PROT 6.9 5.9* 6.3* 6.1*  --   --   --   ALBUMIN 2.5* 2.2* 2.3* 2.3* 2.6* 2.1* 2.1*   No results for input(s): "LIPASE", "AMYLASE" in the last 168 hours. No results for input(s): "AMMONIA" in the last 168 hours. Coagulation Profile: No results for input(s): "INR", "PROTIME" in the last 168 hours. Cardiac Enzymes: No results for input(s): "CKTOTAL", "CKMB", "CKMBINDEX", "TROPONINI" in the last 168 hours. BNP (last 3 results) No results for input(s): "PROBNP" in the last 8760 hours. HbA1C: No results for input(s): "HGBA1C" in the last 72 hours. CBG: Recent Labs  Lab  06/25/22 2108 06/26/22 0805 06/26/22 1206 06/26/22 1718 06/26/22 2049  GLUCAP 150* 98 100* 194* 234*   Lipid Profile: No results for input(s): "CHOL", "HDL", "LDLCALC", "TRIG", "CHOLHDL", "LDLDIRECT" in the last 72 hours. Thyroid Function Tests: No results for input(s): "TSH", "T4TOTAL", "FREET4", "T3FREE", "THYROIDAB" in the last 72 hours. Anemia Panel: No results for input(s): "VITAMINB12", "FOLATE", "FERRITIN", "TIBC", "IRON", "RETICCTPCT" in the last 72 hours. Sepsis Labs: No results for input(s): "PROCALCITON", "LATICACIDVEN" in the last 168 hours.  Recent Results (from the past 240 hour(s))  SARS Coronavirus 2 by RT PCR (hospital order, performed in [facility] hospital lab) *cepheid single result test* Anterior Nasal Swab     Status: Abnormal   Collection Time: 05/29/2022  5:34 PM   Specimen: Anterior Nasal Swab  Result Value Ref Range Status   SARS Coronavirus 2 by RT PCR POSITIVE (A) NEGATIVE Final    Comment: (NOTE) SARS-CoV-2 target nucleic acids are DETECTED  SARS-CoV-2 RNA is generally detectable in upper respiratory specimens  during the acute phase of infection.  Positive results are indicative  of the presence of the identified virus, but do not rule out bacterial infection or co-infection with other pathogens not detected by the test.  Clinical correlation with patient history and  other diagnostic information is necessary to determine patient infection status.  The expected result is negative.  Fact Sheet for Patients:   https://www.patel.info/   Fact Sheet for Healthcare Providers:   https://hall.com/    This test is not yet approved or cleared by the [place] FDA and  has been authorized for detection and/or diagnosis of SARS-CoV-2 by FDA under an Emergency Use Authorization (EUA).  This EUA will remain in effect (meaning this test can be used) for the duration of  the COVID-19 declaration under Section  564(b)(1)  of the Act, 21 U.S.C. section 360-bbb-3(b)(1), unless the authorization is terminated or revoked sooner.   Performed at [facility], [place] [address] [place], [place] [address]   Expectorated Sputum Assessment w Gram Stain, Rflx to Resp Cult     Status: None   Collection Time: 06/19/22  3:34 PM   Specimen: Expectorated Sputum  Result Value Ref Range Status   Specimen Description EXPECTORATED SPUTUM  Final   Special Requests NONE  Final   Sputum evaluation   Final    Sputum specimen not acceptable for testing.  Please recollect.   NOTIFIED SEZER,N  RN AT 2103 ON 06/19/22 BY VAZQUEZJ Performed at [facility], [place] [address], [place], [place] [address]    Report Status 06/20/2022 FINAL  Final  Expectorated Sputum Assessment w Gram Stain, Rflx to Resp Cult     Status: None   Collection Time: 06/20/22 12:11 PM  Result Value Ref Range Status   Specimen Description EXPECTORATED SPUTUM  Final   Special Requests NONE  Final   Sputum evaluation   Final    Sputum specimen not acceptable for testing.  Please recollect.   Vernell Barrier, J RN @1412  ON 06/20/2022 BY Gloriann Loan Performed at [facility], [place] [address] [place], [place] [address]    Report Status 06/20/2022 FINAL  Final  MRSA Next Gen by PCR, Nasal     Status: None   Collection Time: 06/21/22  1:28 PM   Specimen: Nasal Mucosa; Nasal Swab  Result Value Ref Range Status   MRSA by PCR Next Gen NOT DETECTED NOT DETECTED Final    Comment: (NOTE) The GeneXpert MRSA Assay (FDA approved for NASAL specimens only), is one component of a comprehensive MRSA colonization surveillance program. It is not intended to diagnose MRSA infection nor to guide or monitor treatment for MRSA infections. Test performance is not FDA approved in patients less than 26 years old. Performed at [facility], [place] [address], [place], [place] [address]          Radiology  Studies: No results found.      Scheduled Meds:  arformoterol  15 mcg Nebulization BID   budesonide (PULMICORT) nebulizer solution  0.25 mg Nebulization BID   Chlorhexidine Gluconate Cloth  6 each Topical Daily   clopidogrel  75 mg Oral Daily   dapagliflozin propanediol  10 mg Oral QAC breakfast   furosemide  40 mg Intravenous BID   guaiFENesin  1,200 mg Oral BID   methylPREDNISolone (SOLU-MEDROL) injection  81.25 mg Intravenous Daily   metoprolol succinate  75 mg Oral Daily   pantoprazole  40 mg Oral Daily   revefenacin  175 mcg Nebulization Daily   rosuvastatin  20 mg Oral QHS   sacubitril-valsartan  1 tablet Oral BID   sodium chloride HYPERTONIC  4 mL Nebulization BID   spironolactone  25 mg Oral Daily   tamsulosin  0.4 mg Oral QPC supper   Continuous Infusions:  ampicillin-sulbactam (UNASYN) IV Stopped (06/27/22 0617)     LOS: 8 days   Time spent= 35 mins    Rosalie Buenaventura Arsenio Loader, MD [facility]  If 7PM-7AM, please contact night-coverage  06/27/2022, 7:43 AM

## 2022-06-27 NOTE — Procedures (Signed)
Intubation Procedure Note  Paul Sandoval  [id number]  [birth year]  Date:06/27/22  Time:1:23 PM   Provider Performing:Rory Xiang C Tamala Julian    Procedure: Intubation (31500)  Indication(s) Respiratory Failure  Consent Risks of the procedure as well as the alternatives and risks of each were explained to the patient and/or caregiver.  Consent for the procedure was obtained and is signed in the bedside chart   Anesthesia Etomidate, roc   Time Out Verified patient identification, verified procedure, site/side was marked, verified correct patient position, special equipment/implants available, medications/allergies/relevant history reviewed, required imaging and test results available.   Sterile Technique Usual hand hygeine, masks, and gloves were used   Procedure Description Patient positioned in bed supine.  Sedation given as noted above.  Patient was intubated with endotracheal tube using Glidescope.  View was Grade 1 full glottis .  Number of attempts was 1.  Colorimetric CO2 detector was consistent with tracheal placement.   Complications/Tolerance None; patient tolerated the procedure well. Chest X-ray is ordered to verify placement.   EBL Minimal   Specimen(s) None

## 2022-06-27 NOTE — Progress Notes (Signed)
An USGPIV (ultrasound guided PIV) has been placed on LFA 20G x 1.88" for short-term vasopressor infusion. A correctly placed ivWatch must be used when administering Vasopressors. Should this treatment be needed beyond 72 hours, central line access should be obtained.  It will be the responsibility of the bedside nurse to follow best practice to prevent extravasations.

## 2022-06-27 NOTE — Procedures (Signed)
Bronchoscopy Procedure Note  Paul Sandoval  [id number]  1941/02/18  Date:06/27/22  Time:1:23 PM   Provider Performing:Paul Sandoval   Procedure(s):  Flexible bronchoscopy with bronchial alveolar lavage [phone]) and Initial Therapeutic Aspiration of Tracheobronchial Tree [phone])  Indication(s) Abnormal CT chest  Consent Risks of the procedure as well as the alternatives and risks of each were explained to the patient and/or caregiver.  Consent for the procedure was obtained and is signed in the bedside chart  Anesthesia In place for intubation   Time Out Verified patient identification, verified procedure, site/side was marked, verified correct patient position, special equipment/implants available, medications/allergies/relevant history reviewed, required imaging and test results available.   Sterile Technique Usual hand hygiene, masks, gowns, and gloves were used   Procedure Description Bronchoscope advanced through endotracheal tube and into airway.  Airways were examined down to subsegmental level with findings noted below.   Following diagnostic evaluation, - therapeutic aspiration of mucus plugging of bronchus intermedius - BAL in bronchus intermedius with return of plug filled fluid  Findings:  - There is a fungating soft tissue mass adherent to the anterior tracheal wall, will need to evaluate further with cryo and nondisposible bronch - Mucus plugging of bronchus intermedius - Mild airway erythema  Complications/Tolerance None; patient tolerated the procedure well. Chest X-ray is not needed post procedure.   EBL Minimal   Specimen(s) BAL RLL

## 2022-06-27 NOTE — Progress Notes (Signed)
SLP Cancellation Note  Patient Details [name]. MRN: [id number] DOB: Aug 31, 1940   Cancelled treatment:       Reason Eval/Treat Not Completed: Other (comment) (pt in the process of undergoing intubation and having bronch completed, will continue efforts)  Kathleen Lime, MS [facility] SLP [place] Office [phone] Pager [phone]   Macario Golds 06/27/2022, 12:53 PM

## 2022-06-27 NOTE — Progress Notes (Signed)
[facility] Progress Note Patient [name]. DOB: 07/11/1941 MRN: [id number]   Date of Service  06/27/2022  HPI/Events of Note  Notified of ectopies noted on telemetry Patient seen on vent, responsive not in acute distress  eICU Interventions  EKG and BMP ordered     Intervention Category Intermediate Interventions: Arrhythmia - evaluation and management  Judd Lien 06/27/2022, 10:34 PM

## 2022-06-27 NOTE — Progress Notes (Addendum)
NAME:  Jeff Frieden., MRN:  [id number], DOB:  1941-06-26, LOS: 8 ADMISSION DATE:  06/12/2022, CONSULTATION DATE:  10/26 REFERRING MD:  Cyndia Skeeters, CHIEF COMPLAINT:  Dyspnea   History of Present Illness:  81 y/o male admitted with acute respiratory failure with hypoxemia in the setting of COVID 19  pneumonia.  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  Pertinent  Medical History  Former smoker, COPD, Chronic HFrEF (03/2022> EF 30-35%/ G1DD/ mildly reduced RVSF)/ HFpEF, HLD, CKD 3b, anemia of chronic disease; stage IIIa NSCLC RUL treated with chemo/rad 2017   Patient of Dr. Elsworth Soho  [facility] Events: Including procedures, antibiotic start and stop dates in addition to other pertinent events   10/23 Admitted to [facility] 10/25 Increased O2 need to heated high flow. PCCM consulted 10/26 attempted right thoracentesis, no fluid obtained 10/26 CT-PA > no PE.  Right lower lobe consolidation with mucous, obstructing material right lower lobe and right middle lobe bronchi.  Left lower lobe and lingular airspace disease.  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  Right basilar pleural 10.8 cm fluid collection 10/28 worse infiltrates on CXR  10/30 solumedrol added, lasix to 40mg  BID  11/1 cont on unasyn   Interim History / Subjective:  Wants to get out of bed   Objective   Blood pressure (!) 152/49, pulse (!) 53, temperature 98 F (36.7 C), temperature source Oral, resp. rate (!) 21, height 5\' 10"  (1.778 m), weight 77.3 kg, SpO2 98 %.    FiO2 (%):  [50 %-70 %] 50 %   Intake/Output Summary (Last 24 hours) at 06/27/2022 1021 Last data filed at 06/27/2022 0600 Gross per 24 hour  Intake 1076.76 ml  Output 2035 ml  Net -958.24 ml   Filed Weights   05/30/2022 0605 06/26/22 0334 06/27/22 0500  Weight: 78.9 kg 78.2 kg 77.3 kg    Examination: General:chronically ill elderly M NAD in bed  HENT: NCAT pink mm  PULM:  Bilateral rhonchi. Symmetrical chest expansion, even unlabored  CV: rr cap refill < 3 sec  GI: soft ndnt  MSK: no acute joint deformity, no cyanosis or clubbing  Neuro: AAO following commands  Skin: c/d/w, some scattered ecchymosis    Resolved Hospital Problem list     Assessment & Plan:   Acute on chronic respiratory failure with hypoxemia Stg IIIa RUL NSCLC (s/p chemo radiation 2017) AECOPD COVID infection  Multifocal PNA, suspect necrotizing PNA w/:  RLL bronchus obstruction -- associated post obstructive PNA  RML bronchus partial obstruction Mediastinal shift in setting of above obstructive processes Chronic RUL Pulmonary fibrosis, associated bronchiectasis  Small R pleural effusion/fibrothorax, chronic  Hx R VATS, talc pleurodesis  Acute on chronic combined systolic and diastolic HF  P -ultimately he is going to need a bronch. Not sure what degree of oxygenation improvement we will be able to achieve in interim  -CXR now -cont to wean O2 as able -pulm hygiene, mobility  -unasyn -- would extend this course for likely necrotizing PNA, 2-3wks  -diurese per primary  -cont on solumedrol -pulmicort yupelri brovana -HTS nebs and mucinex  -aspiration precautions    Rest per primary:  Urinary retention CKD 3b CAD  HTN  RBBB GERD Thrombocytopenia Anemia  Hyperglycemia  -foley, flowmax  -trend renal indices -antihypertensives, plavix, crestor  -optimize lytes, cardiac monitoring -follow CBC  -PPI  Best Practice (right click and "Reselect all SmartList Selections" daily)   Per  primary  CRITICAL CARE Performed by: Cristal Generous   Total critical care time: 35 minutes  Critical care time was exclusive of separately billable procedures and treating other patients. Critical care was necessary to treat or prevent imminent or life-threatening deterioration.  Critical care was time spent personally by me on the following activities: development of treatment plan  with patient and/or surrogate as well as nursing, discussions with consultants, evaluation of patient's response to treatment, examination of patient, obtaining history from patient or surrogate, ordering and performing treatments and interventions, ordering and review of laboratory studies, ordering and review of radiographic studies, pulse oximetry and re-evaluation of patient's condition.  Eliseo Gum MSN, AGACNP-BC [place] for pager  06/27/2022, 10:21 AM

## 2022-06-27 NOTE — Progress Notes (Addendum)
RT helped MD with bedside intubation and bronch. Pt had no complications at this time.

## 2022-06-27 DEATH — deceased

## 2022-06-28 ENCOUNTER — Inpatient Hospital Stay (HOSPITAL_COMMUNITY): Payer: Medicare Other

## 2022-06-28 ENCOUNTER — Encounter (HOSPITAL_COMMUNITY): Admission: EM | Disposition: E | Payer: Self-pay | Source: Home / Self Care | Attending: Student

## 2022-06-28 DIAGNOSIS — I5043 Acute on chronic combined systolic (congestive) and diastolic (congestive) heart failure: Secondary | ICD-10-CM | POA: Diagnosis not present

## 2022-06-28 DIAGNOSIS — R609 Edema, unspecified: Secondary | ICD-10-CM | POA: Diagnosis not present

## 2022-06-28 DIAGNOSIS — I952 Hypotension due to drugs: Secondary | ICD-10-CM

## 2022-06-28 HISTORY — PX: CRYOTHERAPY: SHX6894

## 2022-06-28 HISTORY — PX: BRONCHIAL WASHINGS: SHX5105

## 2022-06-28 HISTORY — PX: VIDEO BRONCHOSCOPY: SHX5072

## 2022-06-28 LAB — CBC
HCT: 33.4 % — ABNORMAL LOW (ref 39.0–52.0)
Hemoglobin: 9.8 g/dL — ABNORMAL LOW (ref 13.0–17.0)
MCH: 25.9 pg — ABNORMAL LOW (ref 26.0–34.0)
MCHC: 29.3 g/dL — ABNORMAL LOW (ref 30.0–36.0)
MCV: 88.1 fL (ref 80.0–100.0)
Platelets: 65 10*3/uL — ABNORMAL LOW (ref 150–400)
RBC: 3.79 MIL/uL — ABNORMAL LOW (ref 4.22–5.81)
RDW: 16.8 % — ABNORMAL HIGH (ref 11.5–15.5)
WBC: 11 10*3/uL — ABNORMAL HIGH (ref 4.0–10.5)
nRBC: 0 % (ref 0.0–0.2)

## 2022-06-28 LAB — BASIC METABOLIC PANEL
Anion gap: 9 (ref 5–15)
Anion gap: 13 (ref 5–15)
BUN: 39 mg/dL — ABNORMAL HIGH (ref 8–23)
BUN: 40 mg/dL — ABNORMAL HIGH (ref 8–23)
CO2: 38 mmol/L — ABNORMAL HIGH (ref 22–32)
CO2: 37 mmol/L — ABNORMAL HIGH (ref 22–32)
Calcium: 7.5 mg/dL — ABNORMAL LOW (ref 8.9–10.3)
Calcium: 8 mg/dL — ABNORMAL LOW (ref 8.9–10.3)
Chloride: 93 mmol/L — ABNORMAL LOW (ref 98–111)
Chloride: 92 mmol/L — ABNORMAL LOW (ref 98–111)
Creatinine, Ser: 1.67 mg/dL — ABNORMAL HIGH (ref 0.61–1.24)
Creatinine, Ser: 1.56 mg/dL — ABNORMAL HIGH (ref 0.61–1.24)
GFR, Estimated: 41 mL/min — ABNORMAL LOW (ref >60)
GFR, Estimated: 44 mL/min — ABNORMAL LOW (ref >60)
Glucose, Bld: 205 mg/dL — ABNORMAL HIGH (ref 70–99)
Glucose, Bld: 123 mg/dL — ABNORMAL HIGH (ref 70–99)
Potassium: 3.1 mmol/L — ABNORMAL LOW (ref 3.5–5.1)
Potassium: 3.4 mmol/L — ABNORMAL LOW (ref 3.5–5.1)
Sodium: 140 mmol/L (ref 135–145)
Sodium: 142 mmol/L (ref 135–145)

## 2022-06-28 LAB — GLUCOSE, CAPILLARY
Glucose-Capillary: 106 mg/dL — ABNORMAL HIGH (ref 70–99)
Glucose-Capillary: 120 mg/dL — ABNORMAL HIGH (ref 70–99)
Glucose-Capillary: 125 mg/dL — ABNORMAL HIGH (ref 70–99)
Glucose-Capillary: 136 mg/dL — ABNORMAL HIGH (ref 70–99)
Glucose-Capillary: 140 mg/dL — ABNORMAL HIGH (ref 70–99)
Glucose-Capillary: 73 mg/dL (ref 70–99)
Glucose-Capillary: 86 mg/dL (ref 70–99)

## 2022-06-28 LAB — MAGNESIUM: Magnesium: 2.3 mg/dL (ref 1.7–2.4)

## 2022-06-28 LAB — TRIGLYCERIDES: Triglycerides: 73 mg/dL (ref <150)

## 2022-06-28 SURGERY — VIDEO BRONCHOSCOPY WITHOUT FLUORO
Anesthesia: Moderate Sedation | Laterality: Bilateral

## 2022-06-28 MED ORDER — POTASSIUM CHLORIDE 20 MEQ PO PACK
20.0000 meq | PACK | ORAL | Status: AC
  Administered 2022-06-28 (×2): 20 meq
  Filled 2022-06-28 (×2): qty 1

## 2022-06-28 MED ORDER — POTASSIUM CHLORIDE 10 MEQ/100ML IV SOLN
10.0000 meq | INTRAVENOUS | Status: AC
  Administered 2022-06-28 (×4): 10 meq via INTRAVENOUS
  Filled 2022-06-28 (×4): qty 100

## 2022-06-28 MED ORDER — ROCURONIUM BROMIDE 10 MG/ML (PF) SYRINGE
PREFILLED_SYRINGE | INTRAVENOUS | Status: AC
  Filled 2022-06-28: qty 10

## 2022-06-28 MED ORDER — DEXTROSE 10 % IV SOLN: INTRAVENOUS | Status: DC

## 2022-06-28 MED ORDER — ETOMIDATE 2 MG/ML IV SOLN
INTRAVENOUS | Status: AC
  Administered 2022-06-28: 10 mg
  Filled 2022-06-28: qty 10

## 2022-06-28 MED ORDER — VITAL HIGH PROTEIN PO LIQD: 1000.0000 mL | ORAL | Status: DC

## 2022-06-28 MED ORDER — PROSOURCE TF20 ENFIT COMPATIBL EN LIQD: 60.0000 mL | Freq: Every day | ENTERAL | Status: DC

## 2022-06-28 MED ORDER — METOPROLOL TARTRATE 25 MG PO TABS
37.5000 mg | ORAL_TABLET | Freq: Two times a day (BID) | ORAL | Status: DC
  Administered 2022-06-28 – 2022-06-29 (×3): 37.5 mg
  Filled 2022-06-28 (×3): qty 1

## 2022-06-28 NOTE — Progress Notes (Signed)
NAME:  Paul Sandoval., MRN:  [id number], DOB:  10-09-1940, LOS: 9 ADMISSION DATE:  06/14/2022, CONSULTATION DATE:  10/26 REFERRING MD:  Cyndia Skeeters, CHIEF COMPLAINT:  Dyspnea   History of Present Illness:  81 y/o male admitted with acute respiratory failure with hypoxemia in the setting of COVID 19  pneumonia.  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  Pertinent  Medical History  Former smoker, COPD, Chronic HFrEF (03/2022> EF 30-35%/ G1DD/ mildly reduced RVSF)/ HFpEF, HLD, CKD 3b, anemia of chronic disease; stage IIIa NSCLC RUL treated with chemo/rad 2017   Patient of Dr. Elsworth Soho  [facility] Events: Including procedures, antibiotic start and stop dates in addition to other pertinent events   10/23 Admitted to [facility] 10/25 Increased O2 need to heated high flow. PCCM consulted 10/26 attempted right thoracentesis, no fluid obtained 10/26 CT-PA > no PE.  Right lower lobe consolidation with mucous, obstructing material right lower lobe and right middle lobe bronchi.  Left lower lobe and lingular airspace disease.  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  Right basilar pleural 10.8 cm fluid collection 10/28 worse infiltrates on CXR  10/30 solumedrol added, lasix to 40mg  BID  11/1 cont on unasyn   Interim History / Subjective:  Wants to get out of bed   Objective   Blood pressure (!) 114/43, pulse 76, temperature 98.2 F (36.8 C), temperature source Axillary, resp. rate 15, height 5\' 10"  (1.778 m), weight 72 kg, SpO2 99 %.    Vent Mode: PRVC FiO2 (%):  [40 %-100 %] 40 % Set Rate:  [11 bmp-16 bmp] 11 bmp Vt Set:  [580 mL] 580 mL PEEP:  [5 cmH20] 5 cmH20 Plateau Pressure:  [25 DEY81-44 cmH20] 38 cmH20   Intake/Output Summary (Last 24 hours) at 06/29/2022 1035 Last data filed at 07/01/2022 1013 Gross per 24 hour  Intake 2778.18 ml  Output 1380 ml  Net 1398.18 ml   Filed Weights   06/26/22 0334 06/27/22  0500 07/06/2022 0500  Weight: 78.2 kg 77.3 kg 72 kg    Examination: General:chronically ill elderly M NAD in bed  HENT: NCAT pink mm  PULM: Bilateral rhonchi. Symmetrical chest expansion, even unlabored  CV: rr cap refill < 3 sec  GI: soft ndnt  MSK: no acute joint deformity, no cyanosis or clubbing  Neuro: AAO following commands  Skin: c/d/w, some scattered ecchymosis    Resolved Hospital Problem list     Assessment & Plan:    Acute on chronic hypoxic respiratory failure with hypoxia and hypercarbia Stg IIIa RUL NSCLC (s/p chemo rad 2017) AECOPD COVID infection Multifocal PNA, ?necrotizing RLL obstruction RML partial obstruction (?mucus impaction on bronch)  Mediastinal shift in setting of above  Chronic RUL pulmonary fibrosis, bronchiectasis Chronic R pleural effusion/fibrothorax  Hx R VATS talc pleurodesis  P -cont MV support, wean as able  -pulm hygiene, VAP -bronch 11/2  -cont unasyn (sch for 2wk course w concern for necrotizing pna)  -solumedrol -yupelri, brovana, budesonide   Hypotension, suspect medication related  P -wean pressors as able -wean sedation as tolerated after bronch   Acute on chronic HFrEF CAD  RBBB Hx HTN P -cont diuresis -entresto, spiro, metop (hold on pressors) -statin -plavix  CKD 3b Urinary retention  P -cont foley for now  -flomax   Acute on chronic Thrombocytopenia  -not sure what the acute etiology is. Looks like typically is >100, this admission has been 50-70s.  Chronic anemia Elevated ddimer -duplex neg on 10/26 but ddimer has incr  P -follow cbc -repeat ble doppler  -cont vte ppx and plavix   GERD -pepcid   Best Practice (right click and "Reselect all SmartList Selections" daily)  Code status - Full Lines/tubes/drains- Foley, ETT VTE ppx  - lovenox GI ppx- pepcid  Family communication- 11/1  CRITICAL CARE Performed by: Cristal Generous   Total critical care time: 35 minutes  Critical care time was  exclusive of separately billable procedures and treating other patients. Critical care was necessary to treat or prevent imminent or life-threatening deterioration.  Critical care was time spent personally by me on the following activities: development of treatment plan with patient and/or surrogate as well as nursing, discussions with consultants, evaluation of patient's response to treatment, examination of patient, obtaining history from patient or surrogate, ordering and performing treatments and interventions, ordering and review of laboratory studies, ordering and review of radiographic studies, pulse oximetry and re-evaluation of patient's condition.  Eliseo Gum MSN, AGACNP-BC [place] for pager  07/21/2022, 10:35 AM

## 2022-06-28 NOTE — Progress Notes (Signed)
Bilateral lower extremity venous duplex has been completed. Preliminary results can be found in CV Proc through chart review.   06/29/2022 11:40 AM Paul Sandoval RVT

## 2022-06-28 NOTE — Progress Notes (Signed)
Initial Nutrition Assessment  DOCUMENTATION CODES:  Non-severe (moderate) malnutrition in context of chronic illness  INTERVENTION:  -If pt remains intubated, recommend initiating Osmolite 1.5 @ 97mL/hr w/ 75mL Prosource TF20 BID (880kcal, 85g protein, 549mL free water) -Goal EN: Osmolite 1.5 @ 74mL/hr x 24hrs (927mL total volume) with Prosource TF20 BID to provide 1600kcal, 100g protein and 78mL free water. -Check lytes daily and replete prn -Consider starting MVI, thiamine x 5-7 days and folic acid x 5-7 days to reduce effects of refeeding. -If pt extubated, advance to regular diet as clinically appropriate. (With poor po intake pt unlikely to have excessive Na intake)  NUTRITION DIAGNOSIS:  Moderate Malnutrition related to chronic illness (COPD, CHF, and new fungating soft tissue mass) as evidenced by percent weight loss, moderate fat depletion, moderate muscle depletion.  GOAL:  Patient will meet greater than or equal to 90% of their needs  MONITOR:  PO intake, Diet advancement, Vent status, TF tolerance  REASON FOR ASSESSMENT:  Ventilator, Consult Enteral/tube feeding initiation and management  ASSESSMENT:  Pt is an 81yo M with PMH of combined CHF/ICM, CAD/CABG, COPD, CKD-3A, right lung cancer, HTN, osteoarthritis, anemia and recent hospitalization for COPD and CHF exacerbation returning with SOB, orthopnea and BLE edema and admitted for acute respiratory failure with hypoxia due to acute on chronic combined CHF,  pneumonia and COPD exacerbation. Found to be covid positive on 10/23.  Pt with increasing respiratory distress yesterday. Decision made to intubate and perform bronchoscopy. Bronchoscopy found fungating soft tissue mass adherent to the anterior tracheal wall. RD consulted for EN. Spoke with RN, possibility for extubation this afternoon. Plan to leave EN trickle recommendations for use this evening if needed.  Visited pt at bedside, wife present. She reports pt's appetite  has declined recently. He started losing weight but she thinks it has now leveled off. Chart review shows a significant 5.6% weight loss in the lat 2 weeks. NFPE shows moderate muscle wasting and moderate fat loss. Pt meets ASPEN criteria for moderate protein calorie malnutrition r/t chronic disease.  Note propofol currently at 2.3mL/hr, earlier set to 53mL/hr providing 61-528kcal from fat/day. Norepinephrine weaned this AM, MAP>/= 65. Recommend using fiber free formula for at least 24hrs after last pressors dose.  Estimated nutrient needs calculated using current vent settings, Tmax, weight from 11/1 (77.3kg). If trickle feeds to begin this afternoon, recommend Osmolite 1.5 @ 38mL/hr with 55mL Prosource TF 20 BID. When EN may advance, recommend goal rate of 65mL/hr x 24hrs (93mL total volume) with 38mL Prosource TF20 BID to provide 1600kcal, 100g protein and 723mL free water.   Medications reviewed and include: plavix, colace, pepcid, lasix, solu-medrol, toprol-xl, miralax, crestor, aldactone, fentanyl, levophed(weaned off at this time), propofol   Labs reviewed: K:3.4, BG:106-123, BUN:40, Cr:1.56, GFR:44, triglycerides:73, CRP:10.8, B12:667     Wt Readings from Last 10 Encounters:  06/27/22 77.3 kg  06/12/22 81.9 kg  06/07/22 79.9 kg  05/31/22 80.4 kg  03/28/22 78.8 kg  03/20/22 79.5 kg  03/19/22 79.4 kg  03/06/22 79.6 kg  02/18/22 81.2 kg  01/01/22 81.5 kg  Significant 5.6% weight loss in the last 2 weeks  NUTRITION - FOCUSED PHYSICAL EXAM:  Flowsheet Row Most Recent Value  Orbital Region Moderate depletion  Upper Arm Region Moderate depletion  Thoracic and Lumbar Region Moderate depletion  Buccal Region Unable to assess  [vent]  Temple Region Moderate depletion  Clavicle Bone Region Moderate depletion  Clavicle and Acromion Bone Region Moderate depletion  Scapular Bone Region Unable  to assess  Dorsal Hand Mild depletion  Patellar Region Moderate depletion  Anterior Thigh  Region Severe depletion  Posterior Calf Region Moderate depletion  Hair Reviewed  Eyes Unable to assess  Mouth Unable to assess  Skin Reviewed  Nails Reviewed       Diet Order:   Diet Order             Diet NPO time specified  Diet effective now                   EDUCATION NEEDS:   Education needs have been addressed (discussed nutrition with wife at bedside)  Skin:  Skin Assessment: Reviewed RN Assessment  Last BM:  06/26/22  Height:  Ht Readings from Last 1 Encounters:  06/19/2022 5\' 10"  (1.778 m)   Weight:  Wt Readings from Last 1 Encounters:  07/09/2022 72 kg    BMI:  Body mass index is 22.78 kg/m.  Estimated Nutritional Needs:  Kcal:  1545-1930kcal ([employer]:1561) Protein:  90-115g Fluid:  1950-2364mL  Candise Bowens, MS, RD, LDN, CNSC See AMiON for contact information

## 2022-06-28 NOTE — Progress Notes (Signed)
OG tube difficult to flush/administer medication. Ordered abd xray to verify placement of tubing after readjustment.

## 2022-06-28 NOTE — Progress Notes (Signed)
[facility] ADULT ICU REPLACEMENT PROTOCOL   The patient does apply for the [facility] Adult ICU Electrolyte Replacment Protocol based on the criteria listed below:   1.Exclusion criteria: TCTS patients, ECMO patients, and Dialysis patients 2. Is GFR >/= 30 ml/min? Yes.    Patient's GFR today is 41 3. Is SCr </= 2? Yes.   Patient's SCr is 1.67 mg/dL 4. Did SCr increase >/= 0.5 in 24 hours? No. 5.Pt's weight >40kg  Yes.   6. Abnormal electrolyte(s): K+ 3.1  7. Electrolytes replaced per protocol 8.  Call MD STAT for K+ </= 2.5, Phos </= 1, or Mag </= 1 Physician:  Zollie Scale [facility] 07/14/2022 2:26 AM

## 2022-06-28 NOTE — Progress Notes (Signed)
RT helped MD with bedside bronch with ENDO equipment.

## 2022-06-28 NOTE — Progress Notes (Signed)
PT Cancellation Note  Patient Details [name]. MRN: [id number] DOB: 17-Dec-1940   Cancelled Treatment:    Reason Eval/Treat Not Completed: Medical issues which prohibited therapy Patient is on vent. Will folllow. [place] Office [phone] Weekend [phone]   Claretha Cooper 07/09/2022, 11:13 AM

## 2022-06-28 NOTE — Progress Notes (Signed)
Difficulty irrigating OG tube before use. Abdominal x-ray ordered to verify placement.

## 2022-06-28 NOTE — Progress Notes (Signed)
[facility] Progress Note Patient [name]. DOB: 12-01-1940 MRN: [id number]   Date of Service  07/13/2022  HPI/Events of Note  Received request for renewal of restraints Patient seen intubated and a risk for self harm by pulling lines and tubes  eICU Interventions  Bilateral soft wrist restraints renewed Bedside team to assess in am if restraints to be continued     Intervention Category Intermediate Interventions: Other:  Judd Lien 07/24/2022, 7:37 PM

## 2022-06-28 NOTE — Procedures (Addendum)
Bronchoscopy Procedure Note  Paul Sandoval  [id number]  07-Jun-1941  Date:07/15/2022  Time:4:07 PM   Provider Performing:[name]   Procedure(s):  [phone] Bronchoscopy  rigid or flexible  including fluoroscopic guidance  when performed; with destruction of tumor or relief of stenosis by any method other than excision (eg cryotherapy)  [phone] Bronchoscopy  rigid or flexible  including fluoroscopic guidance  when performed; with bronchial or endobronchial biopsy(s)  single or multiple sites  [id number] Bronchoscopy  rigid or flexible  including fluoroscopic guidance  when performed; with therapeutic aspiration of tracheobronchial tree  subsequent  same hospital stay  Indication(s) Abnormal previous bronch  Consent Risks of the procedure as well as the alternatives and risks of each were explained to the patient and/or caregiver.  Consent for the procedure was obtained and is signed in the bedside chart  Anesthesia Etomidate 10mg , prop/fent gtt in place for mechanical sedation   Time Out Verified patient identification, verified procedure, site/side was marked, verified correct patient position, special equipment/implants available, medications/allergies/relevant history reviewed, required imaging and test results available.   Sterile Technique Usual hand hygiene, masks, gowns, and gloves were used   Procedure Description Bronchoscope advanced through endotracheal tube and into airway.  Airways were examined down to subsegmental level with findings noted below.    Following diagnostic evaluation, cryo debulking performed in trachea, left mainstem that was causing stenosis of these areas.  Performed therapeutic aspiration in RLL of purulent material.  After procedure airway open down to segmental level  Findings:  - thick rind-like abnormal soft tissue material occluding mid-trachea 70% and this extended down to left mainstem as well as secondary carina on left.  This material  was debulked with cryoprobe and material/?tumor sent for path. - purulent mucus plugging RLL recurrent, suctioned - abnormal mucosa in RLL extremely friable, did not attempt to biopsy at this time   Complications/Tolerance None; patient tolerated the procedure well. Chest X-ray is not needed post procedure.   EBL Minimal   Specimen(s) Endobronchial cryobiopsies sent for cyto    Example of material occluding mid-trachea, would peel off tracheal wall like a cast with cryo   Post debulking   Some of material taken out

## 2022-06-29 DIAGNOSIS — U071 COVID-19: Secondary | ICD-10-CM | POA: Diagnosis not present

## 2022-06-29 DIAGNOSIS — J189 Pneumonia, unspecified organism: Secondary | ICD-10-CM | POA: Diagnosis not present

## 2022-06-29 DIAGNOSIS — E440 Moderate protein-calorie malnutrition: Secondary | ICD-10-CM | POA: Insufficient documentation

## 2022-06-29 DIAGNOSIS — J9621 Acute and chronic respiratory failure with hypoxia: Secondary | ICD-10-CM | POA: Diagnosis not present

## 2022-06-29 DIAGNOSIS — J9622 Acute and chronic respiratory failure with hypercapnia: Secondary | ICD-10-CM | POA: Diagnosis not present

## 2022-06-29 DIAGNOSIS — J1282 Pneumonia due to coronavirus disease 2019: Secondary | ICD-10-CM

## 2022-06-29 LAB — CBC
HCT: 28.6 % — ABNORMAL LOW (ref 39.0–52.0)
Hemoglobin: 8.8 g/dL — ABNORMAL LOW (ref 13.0–17.0)
MCH: 26.9 pg (ref 26.0–34.0)
MCHC: 30.8 g/dL (ref 30.0–36.0)
MCV: 87.5 fL (ref 80.0–100.0)
Platelets: 59 10*3/uL — ABNORMAL LOW (ref 150–400)
RBC: 3.27 MIL/uL — ABNORMAL LOW (ref 4.22–5.81)
RDW: 16.8 % — ABNORMAL HIGH (ref 11.5–15.5)
WBC: 5.9 10*3/uL (ref 4.0–10.5)
nRBC: 0 % (ref 0.0–0.2)

## 2022-06-29 LAB — BASIC METABOLIC PANEL
Anion gap: 10 (ref 5–15)
BUN: 41 mg/dL — ABNORMAL HIGH (ref 8–23)
CO2: 38 mmol/L — ABNORMAL HIGH (ref 22–32)
Calcium: 7.8 mg/dL — ABNORMAL LOW (ref 8.9–10.3)
Chloride: 95 mmol/L — ABNORMAL LOW (ref 98–111)
Creatinine, Ser: 1.64 mg/dL — ABNORMAL HIGH (ref 0.61–1.24)
GFR, Estimated: 42 mL/min — ABNORMAL LOW (ref >60)
Glucose, Bld: 137 mg/dL — ABNORMAL HIGH (ref 70–99)
Potassium: 3.7 mmol/L (ref 3.5–5.1)
Sodium: 143 mmol/L (ref 135–145)

## 2022-06-29 LAB — GLUCOSE, CAPILLARY
Glucose-Capillary: 115 mg/dL — ABNORMAL HIGH (ref 70–99)
Glucose-Capillary: 105 mg/dL — ABNORMAL HIGH (ref 70–99)
Glucose-Capillary: 105 mg/dL — ABNORMAL HIGH (ref 70–99)
Glucose-Capillary: 171 mg/dL — ABNORMAL HIGH (ref 70–99)

## 2022-06-29 LAB — PHOSPHORUS: Phosphorus: 3.7 mg/dL (ref 2.5–4.6)

## 2022-06-29 LAB — MAGNESIUM: Magnesium: 2.3 mg/dL (ref 1.7–2.4)

## 2022-06-29 MED ORDER — METOPROLOL TARTRATE 25 MG PO TABS
37.5000 mg | ORAL_TABLET | Freq: Two times a day (BID) | ORAL | Status: DC
  Administered 2022-06-29 – 2022-07-04 (×11): 37.5 mg via ORAL
  Filled 2022-06-29 (×12): qty 2

## 2022-06-29 MED ORDER — ORAL CARE MOUTH RINSE
15.0000 mL | OROMUCOSAL | Status: DC
  Administered 2022-06-29 (×5): 15 mL via OROMUCOSAL

## 2022-06-29 MED ORDER — POTASSIUM CHLORIDE 20 MEQ PO PACK: 40.0000 meq | PACK | Freq: Once | ORAL | Status: DC

## 2022-06-29 MED ORDER — DOCUSATE SODIUM 50 MG/5ML PO LIQD
100.0000 mg | Freq: Two times a day (BID) | ORAL | Status: DC
  Administered 2022-06-29 – 2022-07-06 (×5): 100 mg via ORAL
  Filled 2022-06-29 (×16): qty 10

## 2022-06-29 MED ORDER — BACLOFEN 10 MG PO TABS
5.0000 mg | ORAL_TABLET | Freq: Three times a day (TID) | ORAL | Status: DC | PRN
  Administered 2022-06-29: 5 mg via ORAL
  Filled 2022-06-29: qty 1

## 2022-06-29 MED ORDER — ROSUVASTATIN CALCIUM 20 MG PO TABS
20.0000 mg | ORAL_TABLET | Freq: Every day | ORAL | Status: DC
  Administered 2022-06-29 – 2022-07-07 (×8): 20 mg via ORAL
  Filled 2022-06-29 (×8): qty 1

## 2022-06-29 MED ORDER — CLOPIDOGREL BISULFATE 75 MG PO TABS
75.0000 mg | ORAL_TABLET | Freq: Every day | ORAL | Status: DC
  Administered 2022-06-30 – 2022-07-03 (×4): 75 mg via ORAL
  Filled 2022-06-29 (×4): qty 1

## 2022-06-29 MED ORDER — FUROSEMIDE 10 MG/ML IJ SOLN
40.0000 mg | Freq: Every day | INTRAMUSCULAR | Status: DC
  Administered 2022-06-30 – 2022-07-03 (×4): 40 mg via INTRAVENOUS
  Filled 2022-06-29 (×4): qty 4

## 2022-06-29 MED ORDER — FAMOTIDINE 20 MG PO TABS
20.0000 mg | ORAL_TABLET | Freq: Every day | ORAL | Status: DC
  Administered 2022-06-30 – 2022-07-03 (×4): 20 mg via ORAL
  Filled 2022-06-29 (×4): qty 1

## 2022-06-29 MED ORDER — POLYETHYLENE GLYCOL 3350 17 G PO PACK
17.0000 g | PACK | Freq: Every day | ORAL | Status: DC
  Administered 2022-07-04 – 2022-07-06 (×2): 17 g via ORAL
  Filled 2022-06-29 (×4): qty 1

## 2022-06-29 MED ORDER — SACUBITRIL-VALSARTAN 24-26 MG PO TABS
1.0000 | ORAL_TABLET | Freq: Two times a day (BID) | ORAL | Status: DC
  Administered 2022-06-29 – 2022-07-04 (×11): 1 via ORAL
  Filled 2022-06-29 (×14): qty 1

## 2022-06-29 MED ORDER — GUAIFENESIN 100 MG/5ML PO LIQD
10.0000 mL | Freq: Two times a day (BID) | ORAL | Status: DC
  Administered 2022-06-29 – 2022-07-01 (×4): 10 mL via ORAL
  Filled 2022-06-29 (×4): qty 10

## 2022-06-29 MED ORDER — METHYLPREDNISOLONE SODIUM SUCC 125 MG IJ SOLR
60.0000 mg | Freq: Every day | INTRAMUSCULAR | Status: DC
  Administered 2022-06-30 – 2022-07-05 (×6): 60 mg via INTRAVENOUS
  Filled 2022-06-29 (×6): qty 2

## 2022-06-29 MED ORDER — POTASSIUM CHLORIDE 20 MEQ PO PACK
40.0000 meq | PACK | Freq: Once | ORAL | Status: AC
  Administered 2022-06-29: 40 meq via ORAL
  Filled 2022-06-29: qty 2

## 2022-06-29 MED ORDER — TRAZODONE HCL 50 MG PO TABS: 50.0000 mg | ORAL_TABLET | Freq: Every evening | ORAL | Status: DC | PRN

## 2022-06-29 NOTE — Progress Notes (Signed)
[facility] Progress Note Patient [name]. DOB: 1941-07-11 MRN: [id number]   Date of Service  06/29/2022  HPI/Events of Note  Bedside repositioned OG tube after could not flush it & ordered abd xray  Informed by radiology and report noted: Orogastric tube is folded in the mid stomach with distal tip near the gastric fundus/gastroesophageal junction  eICU Interventions  Will ned to pull back a few centimeters and assess if tube will flush     Intervention Category Intermediate Interventions: Diagnostic test evaluation  Judd Lien 06/29/2022, 12:11 AM

## 2022-06-29 NOTE — Progress Notes (Signed)
NAME:  Paul Cantara., Paul Sandoval, DOB:  06-19-1941, LOS: 66 ADMISSION DATE:  06/12/2022, CONSULTATION DATE:  10/26 REFERRING MD:  Cyndia Skeeters, CHIEF COMPLAINT:  Dyspnea   History of Present Illness:  81 y/o male admitted with acute respiratory failure with hypoxemia in the setting of COVID 19  pneumonia.  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  Pertinent  Medical History  Former smoker, COPD, Chronic HFrEF (03/2022> EF 30-35%/ G1DD/ mildly reduced RVSF)/ HFpEF, HLD, CKD 3b, anemia of chronic disease; stage IIIa NSCLC RUL treated with chemo/rad 2017   Patient of Dr. Elsworth Soho  [facility] Events: Including procedures, antibiotic start and stop dates in addition to other pertinent events   10/23 Admitted to [facility] 10/25 Increased O2 need to heated high flow. PCCM consulted 10/26 attempted right thoracentesis, no fluid obtained 10/26 CT-PA > no PE.  Right lower lobe consolidation with mucous, obstructing material right lower lobe and right middle lobe bronchi.  Left lower lobe and lingular airspace disease.  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  Right basilar pleural 10.8 cm fluid collection 10/28 worse infiltrates on CXR  10/30 solumedrol added, lasix to 40mg  BID  11/1 cont on unasyn. Intubated, bronch at bedside w mucus plugging 11/2 bronch w cryo, debulking of abnormal soft tissue occluding part of trachea, RML RLL 11/3 extubate, decr lasix to qD, wean solumedrol     Interim History / Subjective:  Overnight sedation was increased It was weaned this morning  I flipped to PSV   Objective   Blood pressure (!) 138/57, pulse 79, temperature 98.1 F (36.7 C), temperature source Oral, resp. rate (!) 21, height 5\' 10"  (1.778 m), weight 70 kg, SpO2 100 %.    Vent Mode: PSV;CPAP FiO2 (%):  [30 %-40 %] 30 % Set Rate:  [11 bmp] 11 bmp Vt Set:  [580 mL] 580 mL PEEP:  [5 cmH20] 5 cmH20 Plateau Pressure:   [29 cmH20-38 cmH20] 38 cmH20   Intake/Output Summary (Last 24 hours) at 06/29/2022 0947 Last data filed at 06/29/2022 0856 Gross per 24 hour  Intake 1025.03 ml  Output 2170 ml  Net -1144.97 ml   Filed Weights   06/27/22 0500 07/22/2022 0500 06/29/22 0500  Weight: 77.3 kg 72 kg 70 kg    Examination: General: Chronically ill elderly M intubated NAD  HENT: NCAT pink mm ETT secure anicteric sclera  PULM: Symmetrical chest expansion, mechanically ventilated  CV: rr, s1s2 cap refill < 3sec  GI: soft ndnt  MSK: no acute joint deformity, no cyanosis or clubbing. Trace edema Neuro: Awake alert, following commands, no focal deficit  Skin: c/d/w scattered ecchymosis and flaking skin    Resolved Hospital Problem list     Assessment & Plan:   Acute on chronic hypoxic respiratory failure with hypoxia and hypercarbia Stg IIIa RUL NSCLC s/p chemo & radiation in 2017 AECOPD, improving COVID infection Multifocal PNA, ?Necrotizing RLL obstruction, RML and tracheal partial obstruction of abnormal soft tissue, s/p debulking 11/2  Mediastinal shift in setting of above Chronic RUL fibrosis Chronic R pleural effusion/ fibrothorax Hx R VATS and talc pleurodesis  P -WUA/SBT -- hopefully extubate 11/3  -pulm hygiene, VAP -follow path from [place] 11/2  -cont unasyn (sch for 2wk course w concern for necrotizing pna)  -decr solumedrol to 60mg , cont to taper   -yupelri, brovana, budesonide   Hypotension, suspect medication related  P -wean pressors as able, anticipate  dc NE after extubation   Acute on chronic HFrEF CAD  RBBB Hx HTN P -decr lasix to 40mg  qD from BID (at home takes 20mg  qD) -entresto, metop  -statin -plavix  CKD3b Urinary retention  P -cont foley for now  -flomax when taking POs  Acute on chronic Thrombocytopenia  Chronic anemia Elevated ddimer -duplex neg on 10/26  and 11/2 -not sure what the acute low plt etiology is. Looks like typically is >100, this admission  has been 50-70s.  P -follow cbc -cont vte ppx and plavix   GERD -pepcid   Best Practice (right click and "Reselect all SmartList Selections" daily)  Code status - Full Lines/tubes/drains- Foley, ETT VTE ppx  - lovenox GI ppx- pepcid  Family communication- 11/2  CRITICAL CARE Performed by: Cristal Generous   Total critical care time: 38 minutes  Critical care time was exclusive of separately billable procedures and treating other patients. Critical care was necessary to treat or prevent imminent or life-threatening deterioration.  Critical care was time spent personally by me on the following activities: development of treatment plan with patient and/or surrogate as well as nursing, discussions with consultants, evaluation of patient's response to treatment, examination of patient, obtaining history from patient or surrogate, ordering and performing treatments and interventions, ordering and review of laboratory studies, ordering and review of radiographic studies, pulse oximetry and re-evaluation of patient's condition.  Eliseo Gum MSN, AGACNP-BC [place] for pager 06/29/2022, 9:47 AM

## 2022-06-29 NOTE — Progress Notes (Signed)
PT Cancellation Note  Patient Details [name]. MRN: [id number] DOB: [birth year]   Cancelled Treatment:    Reason Eval/Treat Not Completed: Medical issues which prohibited therapy, remains on vent. Will check back on 11/6. [place] Office [phone] Weekend pager-[phone]    Claretha Cooper 06/29/2022, 6:52 AM

## 2022-06-29 NOTE — Procedures (Signed)
Extubation Procedure Note  Patient Details:   [name]. DOB: 1941-08-18 MRN: [id number]   Airway Documentation:    Vent end date: 06/29/22 Vent end time: 0958   Evaluation  O2 sats: stable throughout Complications: No apparent complications Patient did tolerate procedure well. Bilateral Breath Sounds: Diminished   Yes  Johnette Abraham 06/29/2022, 9:59 AM

## 2022-06-29 NOTE — Progress Notes (Signed)
[facility] Cancellation Note  Patient Details [name]. MRN: [id number] DOB: [birth year]   Cancelled Treatment:    Reason Eval/Treat Not Completed: Medical issues which prohibited therapy. Patient on vent and restrained. Will f/u 11/6.   Angy Swearengin L Mazin Emma 06/29/2022, 7:12 AM

## 2022-06-30 DIAGNOSIS — I5043 Acute on chronic combined systolic (congestive) and diastolic (congestive) heart failure: Secondary | ICD-10-CM | POA: Diagnosis not present

## 2022-06-30 DIAGNOSIS — J9621 Acute and chronic respiratory failure with hypoxia: Secondary | ICD-10-CM | POA: Diagnosis not present

## 2022-06-30 LAB — CBC
HCT: 29.6 % — ABNORMAL LOW (ref 39.0–52.0)
Hemoglobin: 8.6 g/dL — ABNORMAL LOW (ref 13.0–17.0)
MCH: 26.2 pg (ref 26.0–34.0)
MCHC: 29.1 g/dL — ABNORMAL LOW (ref 30.0–36.0)
MCV: 90.2 fL (ref 80.0–100.0)
Platelets: 65 10*3/uL — ABNORMAL LOW (ref 150–400)
RBC: 3.28 MIL/uL — ABNORMAL LOW (ref 4.22–5.81)
RDW: 16.9 % — ABNORMAL HIGH (ref 11.5–15.5)
WBC: 7.7 10*3/uL (ref 4.0–10.5)
nRBC: 0 % (ref 0.0–0.2)

## 2022-06-30 LAB — BASIC METABOLIC PANEL
Anion gap: 9 (ref 5–15)
BUN: 40 mg/dL — ABNORMAL HIGH (ref 8–23)
CO2: 35 mmol/L — ABNORMAL HIGH (ref 22–32)
Calcium: 7.9 mg/dL — ABNORMAL LOW (ref 8.9–10.3)
Chloride: 98 mmol/L (ref 98–111)
Creatinine, Ser: 1.75 mg/dL — ABNORMAL HIGH (ref 0.61–1.24)
GFR, Estimated: 38 mL/min — ABNORMAL LOW (ref >60)
Glucose, Bld: 121 mg/dL — ABNORMAL HIGH (ref 70–99)
Potassium: 3.5 mmol/L (ref 3.5–5.1)
Sodium: 142 mmol/L (ref 135–145)

## 2022-06-30 LAB — GLUCOSE, CAPILLARY
Glucose-Capillary: 105 mg/dL — ABNORMAL HIGH (ref 70–99)
Glucose-Capillary: 129 mg/dL — ABNORMAL HIGH (ref 70–99)
Glucose-Capillary: 131 mg/dL — ABNORMAL HIGH (ref 70–99)
Glucose-Capillary: 132 mg/dL — ABNORMAL HIGH (ref 70–99)
Glucose-Capillary: 141 mg/dL — ABNORMAL HIGH (ref 70–99)
Glucose-Capillary: 275 mg/dL — ABNORMAL HIGH (ref 70–99)
Glucose-Capillary: 79 mg/dL (ref 70–99)

## 2022-06-30 LAB — CULTURE, RESPIRATORY W GRAM STAIN: Gram Stain: NONE SEEN

## 2022-06-30 NOTE — Progress Notes (Signed)
NAME:  Paul Bonanno., MRN:  [id number], DOB:  1940/09/30, LOS: 2 ADMISSION DATE:  06/11/2022, CONSULTATION DATE:  10/26 REFERRING MD:  Dr. Cyndia Skeeters, [facility] COMPLAINT:  Dyspnea   History of Present Illness:  81 y/o male admitted with acute respiratory failure with hypoxemia in the setting of COVID 19  pneumonia.  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  He is followed by Dr. Elsworth Soho in the pulmonary office.  Pertinent  Medical History  Former smoker, COPD, Chronic HFrEF (03/2022> EF 30-35%/ G1DD/ mildly reduced RVSF)/ HFpEF, HLD, CKD 3b, anemia of chronic disease; stage IIIa NSCLC RUL treated with chemo/rad 2017  Significant Hospital Events: Including procedures, antibiotic start and stop dates in addition to other pertinent events   10/23 Admitted to [facility] 10/25 Increased O2 need to heated high flow. PCCM consulted 10/26 attempted right thoracentesis, no fluid obtained 10/26 CT-PA > no PE.  Right lower lobe consolidation with mucous, obstructing material right lower lobe and right middle lobe bronchi.  Left lower lobe and lingular airspace disease.  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  Right basilar pleural 10.8 cm fluid collection 10/28 worse infiltrates on CXR  10/30 solumedrol added, lasix to 2m BID  11/01 cont on unasyn  11/03 extubated  Interim History / Subjective:  C/o hoarseness.  Has dry cough.    Objective   Blood pressure (!) 133/59, pulse 85, temperature 98.4 F (36.9 C), temperature source Oral, resp. rate (!) 24, height _0  (1.778 m), weight 70 kg, SpO2 93 %.        Intake/Output Summary (Last 24 hours) at 06/30/2022 1126 Last data filed at 06/30/2022 0425 Gross per 24 hour  Intake 727.92 ml  Output 755 ml  Net -27.08 ml   Filed Weights   06/27/22 0500 07/17/2022 0500 06/29/22 0500  Weight: 77.3 kg 72 kg 70 kg    Examination:  General - alert, sitting in chair eating  breakfast Eyes - pupils reactive ENT - no sinus tenderness, no stridor, decreased voice quality Cardiac - regular rate/rhythm, no murmur Chest - b/l crackles more at bases Abdomen - soft, non tender, + bowel sounds Extremities - no cyanosis, clubbing, or edema Skin - no rashes Neuro - normal strength, moves extremities, follows commands Psych - normal mood and behavior  Resolved Hospital Problem list     Assessment & Plan:   Acute on chronic hypoxic/hypercapnic respiratory failure from COPD/asthma exacerbation, COVID positive from 05/28/2022, and multifocal pneumonia. 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 Hx of Rt upper long lung cancer. - adjust oxygen to keep SpO2 90 to 95% - day 13 of ABx - continue brovana, pulmicort, yupelri - bronchial hygiene - continue solumedrol - f/u BAL from 06/27/22 - follows Dr. AElsworth Sohoin pulmonary office  Hoarseness. - could be related to recent intubation - if this persists, then he might need ENT assessment  Chronic HFrEF. RBBB. HTN, HLD, CAD. CKD 3b. Urine retention. Anemia of critical illness and chronic disease. Thrombocytopenia in setting of critical illness. GERD. - per primary team  Labs:      Latest Ref Rng & Units 06/30/2022    9:13 AM 06/29/2022    2:58 AM 07/02/2022    5:01 AM  CMP  Glucose 70 - 99 mg/dL 121  137  123   BUN 8 - 23 mg/dL 40  41  40  Creatinine 0.61 - 1.24 mg/dL 1.75  1.64  1.56   Sodium 135 - 145 mmol/L 142  143  142   Potassium 3.5 - 5.1 mmol/L 3.5  3.7  3.4   Chloride 98 - 111 mmol/L 98  95  92   CO2 22 - 32 mmol/L 35  38  37   Calcium 8.9 - 10.3 mg/dL 7.9  7.8  8.0        Latest Ref Rng & Units 06/30/2022    9:13 AM 06/29/2022    2:58 AM 07/10/2022    5:01 AM  CBC  WBC 4.0 - 10.5 K/uL 7.7  5.9  11.0   Hemoglobin 13.0 - 17.0 g/dL 8.6  8.8  9.8   Hematocrit 39.0 - 52.0 % 29.6  28.6  33.4   Platelets 150 - 400 K/uL 65  59  65     ABG     Component Value Date/Time   PHART 7.6 (H) 06/27/2022 1543   PCO2ART 42 06/27/2022 1543   PO2ART 222 (H) 06/27/2022 1543   HCO3 41.2 (H) 06/27/2022 1543   TCO2 33 (H) 05/15/2017 0935   TCO2 33 (H) 05/15/2017 0935   ACIDBASEDEF 0.4 06/03/2022 0521   O2SAT 100 06/27/2022 1543    CBG (last 3)  Recent Labs    06/30/22 0409 06/30/22 0703 06/30/22 0810  GLUCAP 79 131* 132*    Signature:  Chesley Mires, MD [place] Pager - [phone] 06/30/2022, 11:26 AM

## 2022-06-30 NOTE — Progress Notes (Signed)
PROGRESS NOTE  Paul Sandoval.  ZOX:[id number] DOB: [birth year] DOA: 06/07/2022 PCP: Coral Spikes, DO   Brief Narrative:  Patient is a 81 year old male with history of tobacco use, COPD, heart failure with reduced ejection fraction, hyperlipidemia, CKD stage IIIb, anemia of chronic disease, stage III non-small cell lung cancer of right upper lobe treated with chemo and radiation in 2017 who initially presented with shortness of breath, orthopnea, bilateral lower extremity edema.  He was discharged positive for COVID.  He was found to be in acute hypoxic respiratory failure with hypoxia secondary to CHF exacerbation, pneumonia and COPD exacerbation.  He developed increased respiratory distress during this hospitalization and required to be transferred to [facility] service.  CTA chest showed multifocal pneumonia concerning for aspiration pneumonia, obstructive endobronchial material within the right lower lobe bronchus, near occlusive material in the right middle lobe bronchus, dense consolidation of the right lower lobe and left lower lobe.  Was intubated by ICU on 11/1, now extubated and transferred back to [facility] service on 11/4.  Significant events:  10/23 Admitted to [facility] 10/25 Increased O2 need to heated high flow. PCCM consulted 10/26 attempted right thoracentesis, no fluid obtained 10/26 CT-PA > no PE.  Right lower lobe consolidation with mucous, obstructing material right lower lobe and right middle lobe bronchi.  Left lower lobe and lingular airspace disease.  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  Right basilar pleural 10.8 cm fluid collection 10/28 worse infiltrates on CXR  10/30 solumedrol added, lasix to 29m BID  11/1 cont on unasyn. Intubated, bronch at bedside w mucus plugging 11/2 bronch w cryo, debulking of abnormal soft tissue occluding part of trachea, RML RLL 11/3 extubate, decr lasix to qD, wean solumedrol     Assessment & Plan:  Principal  Problem:   Acute on chronic combined systolic and diastolic congestive heart failure ([place] Active Problems:   HLD (hyperlipidemia)   Essential hypertension   GERD (gastroesophageal reflux disease)   CAD (coronary artery disease)   Thrombocytopenia (HCC)   Stage 3b chronic kidney disease (CKD) ([place]   Primary cancer of right upper lobe of lung (HCC)   Goals of care, counseling/discussion   Pleural effusion on right   Cardiomyopathy, ischemic   History of endovascular stent graft for abdominal aortic aneurysm (AAA)   Chronic obstructive pulmonary disease with acute exacerbation (HCC)   Acute respiratory failure with hypoxia (HCC)   Anemia of chronic kidney failure   COVID-19 virus infection   Aspiration pneumonia (HCC)   Urinary retention   Multifocal pneumonia   Hypotension due to drugs   Malnutrition of moderate degree   Acute on chronic respiratory failure with hypoxia and hypercapnia (HCC)   Acute on chronic hypoxic respiratory failure: Secondary to multifocal pneumonia, systolic CHF  exacerbation, lung cancer, COVID. Was intubated on 9/1, extubated on 11/3. Underwent bronchoscopy for right lower lobe obstruction, right middle lobe and tracheal partial obstruction due to abnormal soft tissue, status post debulking.  Has chronic right upper lobe fibrosis, chronic right pleural effusion.  History of VATS, talc pleurodesis. Also on Unasyn We will follow pathology report from [facility] on 11/2.  Currently on Unasyn. Currently on Solu-Medrol, will continue to taper.  Continue YCandiss Norse budesonide. Completed course of molnupiravir for COVID He follows with Dr. AElsworth Soho This morning he was in respiratory distress requiring 8 to 10 L of oxygen, PCCM reengaged.  We will continue monitor closely  Acute on chronic systolic CHF: Takes Lasix 20 mg  daily at home.  Currently on 40 mg daily.  Continue to monitor input/output, daily weights.  Continue Entresto, metoprolol. Echocardiogram on  May 2023 showed EF of 61%, grade 1 diastolic dysfunction.  History of coronary artery disease: No anginal symptoms.  Continue Entresto, metoprolol, Plavix. s/p CABG in 1996 and PCI to dLM and SVG-D in 02/2014:    CKD stage IIIb, urine retention: Continue Foley.  On Flomax.  We will give a voiding  trial when appropriate.  Currently being manipulated at baseline.  Baseline creatinine around 1.8-2  Acute on chronic thrombocytopenia/chronic anemia/elevated D-dimer: Duplex negative on 10/26, 11/2.  Continue DVT prophylaxis, Plavix.  Continue to monitor CBC.  Platelets low but stable  GERD: Continue Pepcid  Hyperlipidemia: On crestor  Debility/deconditioning: PT/OT consulted       Nutrition Problem: Moderate Malnutrition Etiology: chronic illness (COPD, CHF, and new fungating soft tissue mass)    DVT prophylaxis:enoxaparin (LOVENOX) injection 30 mg Start: 06/27/22 1000 Place TED hose Start: 06/19/22 1130 SCDs Start: 06/14/2022 1016     Code Status: Full Code  Family Communication: None at bedside  Patient status:Inpatient  Patient is from :Home  Anticipated discharge PJ:KDTO  Estimated DC date:not sure   Consultants: PCCM  Procedures:Intubation,bronch  Antimicrobials:  Anti-infectives (From admission, onward)    Start     Dose/Rate Route Frequency Ordered Stop   06/23/22 0600  Ampicillin-Sulbactam (UNASYN) 3 g in sodium chloride 0.9 % 100 mL IVPB        3 g 200 mL/hr over 30 Minutes Intravenous Every 6 hours 06/22/22 1739 15-Jul-2022 0559   06/19/22 2200  molnupiravir EUA (LAGEVRIO) capsule 800 mg        4 capsule Oral 2 times daily 06/19/22 1514 06/24/22 2159   06/19/22 1000  azithromycin (ZITHROMAX) 500 mg in sodium chloride 0.9 % 250 mL IVPB  Status:  Discontinued        500 mg 250 mL/hr over 60 Minutes Intravenous Every 24 hours 06/06/2022 1931 06/22/22 1711   06/19/22 1000  cefTRIAXone (ROCEPHIN) 1 g in sodium chloride 0.9 % 100 mL IVPB        1 g 200 mL/hr over 30  Minutes Intravenous Every 24 hours 06/05/2022 2120 06/22/22 1026   05/29/2022 2200  nirmatrelvir/ritonavir EUA (renal dosing) (PAXLOVID) 2 tablet  Status:  Discontinued       Note to Pharmacy: Please adjust or change treatment as needed.   2 tablet Oral 2 times daily 06/03/2022 1929 06/19/22 1514   06/17/2022 2030  cefTRIAXone (ROCEPHIN) 1 g in sodium chloride 0.9 % 100 mL IVPB  Status:  Discontinued        1 g 200 mL/hr over 30 Minutes Intravenous Every 24 hours 06/24/2022 1931 06/25/2022 2120   06/05/2022 1000  levofloxacin (LEVAQUIN) tablet 500 mg  Status:  Discontinued        500 mg Oral Daily 06/06/2022 0943 06/22/2022 1943       Subjective: Patient seen and examined at bedside today.  During my evaluation, he was in moderate respiratory distress.  He was coughing.  He was on 8 to 10 L of high flow oxygen and saturating in high 80s.  Patient looked anxious, tachypneic.  Remains alert and oriented.  Objective: Vitals:   06/29/22 2025 06/29/22 2227 06/30/22 0300 06/30/22 0644  BP:  (!) 140/68 (!) 122/51   Pulse:  (!) 121 98 97  Resp:   (!) 22 (!) 24  Temp:   98.2 F (36.8 C)   TempSrc:  Oral   SpO2: 96%  97% 93%  Weight:      Height:        Intake/Output Summary (Last 24 hours) at 06/30/2022 0813 Last data filed at 06/30/2022 0425 Gross per 24 hour  Intake 818.87 ml  Output 1180 ml  Net -361.13 ml   Filed Weights   06/27/22 0500 06/27/2022 0500 06/29/22 0500  Weight: 77.3 kg 72 kg 70 kg    Examination:  General exam: In moderate respite distress, weak  HEENT: PERRL Respiratory system: B/L rhonchi, coarse breath sounds, Cardiovascular system: S1 & S2 heard, RRR. Sinus tach Gastrointestinal system: Abdomen is nondistended, soft and nontender. Central nervous system: Alert and oriented Extremities: No edema, no clubbing ,no cyanosis Skin: No rashes, no ulcers,no icterus GU: FOley     Data Reviewed: I have personally reviewed following labs and imaging studies  CBC: Recent Labs   Lab 06/25/22 0300 06/26/22 0255 06/27/22 0249 07/14/2022 0501 06/29/22 0258  WBC 11.2* 7.1 8.2 11.0* 5.9  HGB 10.2* 9.2* 9.8* 9.8* 8.8*  HCT 34.3* 30.0* 31.8* 33.4* 28.6*  MCV 88.4 87.0 87.6 88.1 87.5  PLT 66* 48* 56* 65* 59*   Basic Metabolic Panel: Recent Labs  Lab 06/24/22 0250 06/25/22 0300 06/26/22 0255 06/27/22 0243 06/27/22 0249 06/27/22 2322 07/23/2022 0127 07/01/2022 0501 06/29/22 0258  NA 142 141 141 143  --   --  140 142 143  K 3.6 3.5 3.3* 4.1  --   --  3.1* 3.4* 3.7  CL 97* 95* 96* 96*  --   --  93* 92* 95*  CO2 37* 37* 36* 38*  --   --  38* 37* 38*  GLUCOSE 152* 167* 115* 110*  --   --  205* 123* 137*  BUN 46* 45* 44* 42*  --   --  39* 40* 41*  CREATININE 1.56* 1.59* 1.54* 1.70*  --   --  1.67* 1.56* 1.64*  CALCIUM 8.1* 8.3* 7.7* 7.9*  --   --  7.5* 8.0* 7.8*  MG 2.6* 2.6* 2.2  --  2.2 2.0  --  2.3 2.3  PHOS 2.4* 3.5 3.0 2.8  --   --   --   --  3.7     Recent Results (from the past 240 hour(s))  Expectorated Sputum Assessment w Gram Stain, Rflx to Resp Cult     Status: None   Collection Time: 06/20/22 12:11 PM  Result Value Ref Range Status   Specimen Description EXPECTORATED SPUTUM  Final   Special Requests NONE  Final   Sputum evaluation   Final    Sputum specimen not acceptable for testing.  Please recollect.   [place]ell Barrier, J RN _0  ON 06/20/2022 BY Gloriann Loan Performed at [facility], [place] [address], [place], [place] [address]    Report Status 06/20/2022 FINAL  Final  MRSA Next Gen by PCR, Nasal     Status: None   Collection Time: 06/21/22  1:28 PM   Specimen: Nasal Mucosa; Nasal Swab  Result Value Ref Range Status   MRSA by PCR Next Gen NOT DETECTED NOT DETECTED Final    Comment: (NOTE) The GeneXpert MRSA Assay (FDA approved for NASAL specimens only), is one component of a comprehensive MRSA colonization surveillance program. It is not intended to diagnose MRSA infection nor to guide or monitor treatment for MRSA infections. Test  performance is not FDA approved in patients less than 95 years old. Performed at [facility], [address] [place], [place] [address]   Culture, Respiratory w Gram Stain     Status: None (Preliminary result)   Collection Time: 06/27/22  1:26 PM   Specimen: Bronchoalveolar Lavage; Respiratory  Result Value Ref Range Status   Specimen Description   Final    BRONCHIAL ALVEOLAR LAVAGE Performed at [facility] [address], [place], [place] [address]    Special Requests   Final    NONE Performed at [facility], [place] [address], [place], [place] [address]    Gram Stain NO WBC SEEN RARE BUDDING YEAST SEEN   Final   Culture   Final    FEW YEAST CULTURE REINCUBATED FOR BETTER GROWTH Performed at [facility], [place] [address] [place],  [address]    Report Status PENDING  Incomplete     Radiology Studies: DG Abd 1 View  Result Date: 07/25/2022 CLINICAL DATA:  Orogastric tube EXAM: ABDOMEN - 1 VIEW COMPARISON:  Abdominal x-ray 07/04/2022 FINDINGS: Orogastric tube is folded in the mid stomach with distal tip near the gastric fundus/gastroesophageal junction. No dilated bowel loops are seen. There surgical clips in the right abdomen. Aorto bi-iliac graft is again noted. Small right pleural effusion and right mid lung opacities persist. IMPRESSION: Orogastric tube is folded in the mid stomach with distal tip near the gastric fundus/gastroesophageal junction. Recommend repositioning. Electronically Signed   By: Ronney Asters M.D.   On: 06/29/2022 23:57   VAS [place] LOWER EXTREMITY VENOUS (DVT)  Result Date: 07/14/2022  Lower Venous DVT Study Patient Name:  Paul Sandoval.  Date of Exam:   07/15/2022 Medical Rec #: [id number]             Accession #:    [id number] Date of Birth: 1941/02/28              Patient Gender: M Patient Age:   32 years Exam Location:  [facility] Procedure:      VAS [place] LOWER EXTREMITY VENOUS  (DVT) Referring Phys: GRACE BOWSER --------------------------------------------------------------------------------  Indications: Edema.  Risk Factors: COVID 19 positive. Limitations: Poor ultrasound/tissue interface and patient positioning, patient immobility. Comparison Study: 06/21/2022 - RIGHT:                   - There is no evidence of deep vein thrombosis in the lower                   extremity.                    - No cystic structure found in the popliteal fossa.                    LEFT:                   - There is no evidence of deep vein thrombosis in the lower                   extremity.                   However, portions of this examination were limited- see                   technologist                   comments above.                    -  No cystic structure found in the popliteal fossa. Performing Technologist: Oliver Hum RVT  Examination Guidelines: A complete evaluation includes B-mode imaging, spectral Doppler, color Doppler, and power Doppler as needed of all accessible portions of each vessel. Bilateral testing is considered an integral part of a complete examination. Limited examinations for reoccurring indications may be performed as noted. The reflux portion of the exam is performed with the patient in reverse Trendelenburg.  +---------+---------------+---------+-----------+----------+--------------+ RIGHT    CompressibilityPhasicitySpontaneityPropertiesThrombus Aging +---------+---------------+---------+-----------+----------+--------------+ CFV      Full           Yes      Yes                                 +---------+---------------+---------+-----------+----------+--------------+ SFJ      Full                                                        +---------+---------------+---------+-----------+----------+--------------+ FV Prox  Full                                                         +---------+---------------+---------+-----------+----------+--------------+ FV Mid   Full                                                        +---------+---------------+---------+-----------+----------+--------------+ FV DistalFull                                                        +---------+---------------+---------+-----------+----------+--------------+ PFV      Full                                                        +---------+---------------+---------+-----------+----------+--------------+ POP      Full           Yes      Yes                                 +---------+---------------+---------+-----------+----------+--------------+ PTV      Full                                                        +---------+---------------+---------+-----------+----------+--------------+ PERO     Full                                                        +---------+---------------+---------+-----------+----------+--------------+   +---------+---------------+---------+-----------+----------+--------------+  LEFT     CompressibilityPhasicitySpontaneityPropertiesThrombus Aging +---------+---------------+---------+-----------+----------+--------------+ CFV      Full           Yes      Yes                                 +---------+---------------+---------+-----------+----------+--------------+ SFJ      Full                                                        +---------+---------------+---------+-----------+----------+--------------+ FV Prox  Full                                                        +---------+---------------+---------+-----------+----------+--------------+ FV Mid   Full                                                        +---------+---------------+---------+-----------+----------+--------------+ FV DistalFull                                                         +---------+---------------+---------+-----------+----------+--------------+ PFV      Full                                                        +---------+---------------+---------+-----------+----------+--------------+ POP      Full           Yes      Yes                                 +---------+---------------+---------+-----------+----------+--------------+ PTV      Full                                                        +---------+---------------+---------+-----------+----------+--------------+ PERO     Full                                                        +---------+---------------+---------+-----------+----------+--------------+     Summary: RIGHT: - There is no evidence of deep vein thrombosis in the lower extremity. However, portions of this examination were limited- see technologist comments above.  - No cystic structure found in the popliteal fossa.  LEFT: - There is no evidence  of deep vein thrombosis in the lower extremity. However, portions of this examination were limited- see technologist comments above.  - No cystic structure found in the popliteal fossa.  *See table(s) above for measurements and observations. Electronically signed by Jamelle Haring on 06/27/2022 at 4:02:34 PM.    Final     Scheduled Meds:  arformoterol  15 mcg Nebulization BID   budesonide (PULMICORT) nebulizer solution  0.25 mg Nebulization BID   Chlorhexidine Gluconate Cloth  6 each Topical Daily   clopidogrel  75 mg Oral Daily   docusate  100 mg Oral BID   enoxaparin (LOVENOX) injection  30 mg Subcutaneous Q24H   famotidine  20 mg Oral Daily   furosemide  40 mg Intravenous Daily   guaiFENesin  10 mL Oral Q12H   methylPREDNISolone (SOLU-MEDROL) injection  60 mg Intravenous Daily   metoprolol tartrate  37.5 mg Oral BID   polyethylene glycol  17 g Oral Daily   revefenacin  175 mcg Nebulization Daily   rosuvastatin  20 mg Oral QHS   sacubitril-valsartan  1 tablet Oral BID    Continuous Infusions:  sodium chloride Stopped (07/25/2022 1222)   ampicillin-sulbactam (UNASYN) IV 3 g (06/30/22 5625)     LOS: 11 days   Shelly Coss, MD [facility] P11/11/2021, 8:13 AM

## 2022-07-01 ENCOUNTER — Encounter (HOSPITAL_COMMUNITY): Payer: Self-pay | Admitting: Internal Medicine

## 2022-07-01 DIAGNOSIS — I5043 Acute on chronic combined systolic (congestive) and diastolic (congestive) heart failure: Secondary | ICD-10-CM | POA: Diagnosis not present

## 2022-07-01 LAB — GLUCOSE, CAPILLARY
Glucose-Capillary: 109 mg/dL — ABNORMAL HIGH (ref 70–99)
Glucose-Capillary: 168 mg/dL — ABNORMAL HIGH (ref 70–99)
Glucose-Capillary: 187 mg/dL — ABNORMAL HIGH (ref 70–99)
Glucose-Capillary: 263 mg/dL — ABNORMAL HIGH (ref 70–99)
Glucose-Capillary: 76 mg/dL (ref 70–99)
Glucose-Capillary: 84 mg/dL (ref 70–99)
Glucose-Capillary: 89 mg/dL (ref 70–99)

## 2022-07-01 LAB — BASIC METABOLIC PANEL
Anion gap: 8 (ref 5–15)
BUN: 39 mg/dL — ABNORMAL HIGH (ref 8–23)
CO2: 35 mmol/L — ABNORMAL HIGH (ref 22–32)
Calcium: 7.8 mg/dL — ABNORMAL LOW (ref 8.9–10.3)
Chloride: 100 mmol/L (ref 98–111)
Creatinine, Ser: 1.65 mg/dL — ABNORMAL HIGH (ref 0.61–1.24)
GFR, Estimated: 41 mL/min — ABNORMAL LOW (ref >60)
Glucose, Bld: 93 mg/dL (ref 70–99)
Potassium: 3.5 mmol/L (ref 3.5–5.1)
Sodium: 143 mmol/L (ref 135–145)

## 2022-07-01 LAB — CBC
HCT: 29.5 % — ABNORMAL LOW (ref 39.0–52.0)
Hemoglobin: 8.6 g/dL — ABNORMAL LOW (ref 13.0–17.0)
MCH: 26.1 pg (ref 26.0–34.0)
MCHC: 29.2 g/dL — ABNORMAL LOW (ref 30.0–36.0)
MCV: 89.4 fL (ref 80.0–100.0)
Platelets: 68 10*3/uL — ABNORMAL LOW (ref 150–400)
RBC: 3.3 MIL/uL — ABNORMAL LOW (ref 4.22–5.81)
RDW: 16.7 % — ABNORMAL HIGH (ref 11.5–15.5)
WBC: 6.8 10*3/uL (ref 4.0–10.5)
nRBC: 0 % (ref 0.0–0.2)

## 2022-07-01 MED ORDER — GUAIFENESIN 100 MG/5ML PO LIQD
15.0000 mL | Freq: Two times a day (BID) | ORAL | Status: DC
  Administered 2022-07-01 – 2022-07-06 (×10): 15 mL via ORAL
  Filled 2022-07-01 (×12): qty 20

## 2022-07-01 MED ORDER — PHENOL 1.4 % MT LIQD
1.0000 | OROMUCOSAL | Status: DC | PRN
  Administered 2022-07-01: 1 via OROMUCOSAL
  Filled 2022-07-01: qty 177

## 2022-07-01 MED ORDER — POTASSIUM CHLORIDE CRYS ER 20 MEQ PO TBCR
40.0000 meq | EXTENDED_RELEASE_TABLET | Freq: Once | ORAL | Status: AC
  Administered 2022-07-01: 40 meq via ORAL
  Filled 2022-07-01: qty 2

## 2022-07-01 MED ORDER — SODIUM CHLORIDE 3 % IN NEBU
4.0000 mL | INHALATION_SOLUTION | Freq: Every day | RESPIRATORY_TRACT | Status: AC
  Administered 2022-07-01 – 2022-07-03 (×3): 4 mL via RESPIRATORY_TRACT
  Filled 2022-07-01 (×3): qty 4

## 2022-07-01 NOTE — Plan of Care (Signed)
  Problem: Education: Goal: Knowledge of General Education information will improve Description: Including pain rating scale, medication(s)/side effects and non-pharmacologic comfort measures Outcome: Progressing   Problem: Clinical Measurements: Goal: Respiratory complications will improve Outcome: Progressing   Problem: Nutrition: Goal: Adequate nutrition will be maintained Outcome: Progressing   Problem: Safety: Goal: Ability to remain free from injury will improve Outcome: Progressing

## 2022-07-01 NOTE — Progress Notes (Signed)
PROGRESS NOTE  Paul Sandoval.  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  He was discharged positive for COVID.  He was found to be in acute hypoxic respiratory failure with hypoxia secondary to CHF exacerbation, pneumonia and COPD exacerbation.  He developed increased respiratory distress during this hospitalization and required to be transferred to [facility] service.  CTA chest showed multifocal pneumonia concerning for aspiration pneumonia, obstructive endobronchial material within the right lower lobe bronchus, near occlusive material in the right middle lobe bronchus, dense consolidation of the right lower lobe and left lower lobe.  Was intubated by ICU on 11/1, now extubated and transferred back to [facility] service on 11/4. Hospital course remarkable  for persistent respiratory distress, cough increased oxygen requirement.  Significant events:  10/23 Admitted to [facility] 10/25 Increased O2 need to heated high flow. PCCM consulted 10/26 attempted right thoracentesis, no fluid obtained 10/26 CT-PA > no PE.  Right lower lobe consolidation with mucous, obstructing material right lower lobe and right middle lobe bronchi.  Left lower lobe and lingular airspace disease.  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  Right basilar pleural 10.8 cm fluid collection 10/28 worse infiltrates on CXR  10/30 solumedrol added, lasix to 21m BID  11/1 cont on unasyn. Intubated, bronch at bedside w mucus plugging 11/2 bronch w cryo, debulking of abnormal soft tissue occluding part of  trachea, RML RLL 11/3 extubate, decr lasix to qD, wean solumedrol     Assessment & Plan:  Principal Problem:   Acute on chronic combined systolic and diastolic congestive heart failure ([place] Active Problems:   HLD (hyperlipidemia)   Essential hypertension   GERD (gastroesophageal reflux disease)   CAD (coronary artery disease)   Thrombocytopenia (HCC)   Stage 3b chronic kidney disease (CKD) ([place]   Primary cancer of right upper lobe of lung (HCC)   Goals of care, counseling/discussion   Pleural effusion on right   Cardiomyopathy, ischemic   History of endovascular stent graft for abdominal aortic aneurysm (AAA)   Chronic obstructive pulmonary disease with acute exacerbation (HCC)   Acute respiratory failure with hypoxia (HCC)   Anemia of chronic kidney failure   COVID-19 virus infection   Aspiration pneumonia (HCC)   Urinary retention   Multifocal pneumonia   Hypotension due to drugs   Malnutrition of moderate degree   Acute on chronic respiratory failure with hypoxia and hypercapnia (HCC)   Acute on chronic hypoxic respiratory failure: Secondary to multifocal pneumonia, systolic CHF  exacerbation, lung cancer, COVID. Was intubated on 9/1, extubated on 11/3. Underwent bronchoscopy for right lower lobe obstruction, right middle lobe and tracheal partial obstruction due to abnormal soft tissue, status post debulking.  Has chronic right upper lobe fibrosis, chronic right pleural effusion.  History of VATS, talc pleurodesis. Also on Unasyn We will follow pathology report from [facility] on 11/2.  Currently on Unasyn. Currently on Solu-Medrol, will continue to taper.  Continue YCandiss Norse budesonide. Completed course of molnupiravir for COVID He follows with Dr. AElsworth Soho This morning he was on 8 to 10 L of oxygen.  Complains of increased secretions, cough.  I ordered  hypertonic saline nebulization, chest physiotherapy. PCCM also following  Acute on chronic systolic CHF: Takes Lasix  20 mg daily at home.  Currently on 40 mg daily.  Continue to monitor input/output, daily weights.  Continue Entresto, metoprolol. Echocardiogram on May 2023 showed EF of 23%, grade 1 diastolic dysfunction.  History of coronary artery disease: No anginal symptoms.  Continue Entresto, metoprolol, Plavix. s/p CABG in 1996 and PCI to dLM and SVG-D in 02/2014:    CKD stage IIIb, urine retention: Continue Foley.  On Flomax.  We will give a voiding  trial when appropriate.  Currently kidney function at baseline.  Baseline creatinine around 1.8-2  Acute on chronic thrombocytopenia/chronic anemia/elevated D-dimer: Duplex negative on 10/26, 11/2.  Continue DVT prophylaxis, Plavix.  Continue to monitor CBC.  Platelets low but stable  GERD: Continue Pepcid  Hyperlipidemia: On crestor  Debility/deconditioning: PT/OT consulted       Nutrition Problem: Moderate Malnutrition Etiology: chronic illness (COPD, CHF, and new fungating soft tissue mass)    DVT prophylaxis:enoxaparin (LOVENOX) injection 30 mg Start: 06/27/22 1000 Place TED hose Start: 06/19/22 1130 SCDs Start: 06/02/2022 1016     Code Status: Full Code  Family Communication: Called and discussed with son on phone on 11/5  Patient status:Inpatient  Patient is from :Home  Anticipated discharge FT:DDUK  Estimated DC date:not sure, still on high flow oxygen   Consultants: PCCM  Procedures:Intubation,bronch  Antimicrobials:  Anti-infectives (From admission, onward)    Start     Dose/Rate Route Frequency Ordered Stop   06/23/22 0600  Ampicillin-Sulbactam (UNASYN) 3 g in sodium chloride 0.9 % 100 mL IVPB        3 g 200 mL/hr over 30 Minutes Intravenous Every 6 hours 06/22/22 1739 Aug 05, 2022 0759   06/19/22 2200  molnupiravir EUA (LAGEVRIO) capsule 800 mg        4 capsule Oral 2 times daily 06/19/22 1514 06/24/22 2159   06/19/22 1000  azithromycin (ZITHROMAX) 500 mg in sodium chloride 0.9 % 250 mL IVPB  Status:  Discontinued         500 mg 250 mL/hr over 60 Minutes Intravenous Every 24 hours 06/22/2022 1931 06/22/22 1711   06/19/22 1000  cefTRIAXone (ROCEPHIN) 1 g in sodium chloride 0.9 % 100 mL IVPB        1 g 200 mL/hr over 30 Minutes Intravenous Every 24 hours 06/16/2022 2120 06/22/22 1026   05/27/2022 2200  nirmatrelvir/ritonavir EUA (renal dosing) (PAXLOVID) 2 tablet  Status:  Discontinued       Note to Pharmacy: Please adjust or change treatment as needed.   2 tablet Oral 2 times daily 06/12/2022 1929 06/19/22 1514   06/17/2022 2030  cefTRIAXone (ROCEPHIN) 1 g in sodium chloride 0.9 % 100 mL IVPB  Status:  Discontinued        1 g 200 mL/hr over 30 Minutes Intravenous Every 24 hours 05/31/2022 1931 06/01/2022 2120   06/15/2022 1000  levofloxacin (LEVAQUIN) tablet 500 mg  Status:  Discontinued        500 mg Oral Daily 06/19/2022 0943 06/05/2022 1943       Subjective: Patient seen and examined at bedside today.  Hemodynamically stable.  On 8 to 10 L of oxygen.  Complains of increased secretions, cough.  Speaking in full sentences.  Overall appears comfortable, sitting in bed.  Objective: Vitals:   07/01/22 0830 07/01/22 0832 07/01/22 0834 07/01/22 0945  BP:   (!) 100/41   Pulse:   (!) 104   Resp:  Temp:      TempSrc:      SpO2: 90% 91%  94%  Weight:      Height:        Intake/Output Summary (Last 24 hours) at 07/01/2022 1117 Last data filed at 07/01/2022 0600 Gross per 24 hour  Intake 480 ml  Output 1050 ml  Net -570 ml   Filed Weights   07/08/2022 0500 06/29/22 0500 07/01/22 0500  Weight: 72 kg 70 kg 70.8 kg    Examination:  General exam: Coughing, weak, deconditioned, pleasant elderly male HEENT: PERRL Respiratory system: Bilateral rhonchi/crackles Cardiovascular system: S1 & S2 heard, RRR.  Gastrointestinal system: Abdomen is nondistended, soft and nontender. Central nervous system: Alert and oriented Extremities: No edema, no clubbing ,no cyanosis Skin: No rashes, no ulcers,no icterus   GU:  Foley   Data Reviewed: I have personally reviewed following labs and imaging studies  CBC: Recent Labs  Lab 06/27/22 0249 07/15/2022 0501 06/29/22 0258 06/30/22 0913 07/01/22 0347  WBC 8.2 11.0* 5.9 7.7 6.8  HGB 9.8* 9.8* 8.8* 8.6* 8.6*  HCT 31.8* 33.4* 28.6* 29.6* 29.5*  MCV 87.6 88.1 87.5 90.2 89.4  PLT 56* 65* 59* 65* 68*   Basic Metabolic Panel: Recent Labs  Lab 06/25/22 0300 06/26/22 0255 06/27/22 0243 06/27/22 0249 06/27/22 2322 07/02/2022 0127 07/24/2022 0501 06/29/22 0258 06/30/22 0913 07/01/22 0347  NA 141 141 143  --   --  140 142 143 142 143  K 3.5 3.3* 4.1  --   --  3.1* 3.4* 3.7 3.5 3.5  CL 95* 96* 96*  --   --  93* 92* 95* 98 100  CO2 37* 36* 38*  --   --  38* 37* 38* 35* 35*  GLUCOSE 167* 115* 110*  --   --  205* 123* 137* 121* 93  BUN 45* 44* 42*  --   --  39* 40* 41* 40* 39*  CREATININE 1.59* 1.54* 1.70*  --   --  1.67* 1.56* 1.64* 1.75* 1.65*  CALCIUM 8.3* 7.7* 7.9*  --   --  7.5* 8.0* 7.8* 7.9* 7.8*  MG 2.6* 2.2  --  2.2 2.0  --  2.3 2.3  --   --   PHOS 3.5 3.0 2.8  --   --   --   --  3.7  --   --      Recent Results (from the past 240 hour(s))  MRSA Next Gen by PCR, Nasal     Status: None   Collection Time: 06/21/22  1:28 PM   Specimen: Nasal Mucosa; Nasal Swab  Result Value Ref Range Status   MRSA by PCR Next Gen NOT DETECTED NOT DETECTED Final    Comment: (NOTE) The GeneXpert MRSA Assay (FDA approved for NASAL specimens only), is one component of a comprehensive MRSA colonization surveillance program. It is not intended to diagnose MRSA infection nor to guide or monitor treatment for MRSA infections. Test performance is not FDA approved in patients less than 49 years old. Performed at [facility], [place] [address], [place], [place] [address]   Culture, Respiratory w Gram Stain     Status: None   Collection Time: 06/27/22  1:26 PM   Specimen: Bronchoalveolar Lavage; Respiratory  Result Value Ref Range Status   Specimen  Description   Final    BRONCHIAL ALVEOLAR LAVAGE Performed at [facility] [address], [place], [place] [address]    Special Requests   Final  NONE Performed at [facility], [place] [address], [place], [place] [address]    Gram Stain   Final    NO WBC SEEN RARE BUDDING YEAST SEEN Performed at [facility], [place] [address], [place], [place] [address]    Culture FEW CANDIDA ALBICANS  Final   Report Status 06/30/2022 FINAL  Final     Radiology Studies: No results found.  Scheduled Meds:  arformoterol  15 mcg Nebulization BID   budesonide (PULMICORT) nebulizer solution  0.25 mg Nebulization BID   Chlorhexidine Gluconate Cloth  6 each Topical Daily   clopidogrel  75 mg Oral Daily   docusate  100 mg Oral BID   enoxaparin (LOVENOX) injection  30 mg Subcutaneous Q24H   famotidine  20 mg Oral Daily   furosemide  40 mg Intravenous Daily   guaiFENesin  15 mL Oral Q12H   methylPREDNISolone (SOLU-MEDROL) injection  60 mg Intravenous Daily   metoprolol tartrate  37.5 mg Oral BID   polyethylene glycol  17 g Oral Daily   revefenacin  175 mcg Nebulization Daily   rosuvastatin  20 mg Oral QHS   sacubitril-valsartan  1 tablet Oral BID   sodium chloride HYPERTONIC  4 mL Nebulization Daily   Continuous Infusions:  sodium chloride Stopped (07/24/2022 1222)   ampicillin-sulbactam (UNASYN) IV 3 g (07/01/22 7673)     LOS: 12 days   Shelly Coss, MD [facility] P11/12/2021, 11:17 AM

## 2022-07-02 ENCOUNTER — Inpatient Hospital Stay (HOSPITAL_COMMUNITY): Payer: Medicare Other

## 2022-07-02 ENCOUNTER — Encounter (HOSPITAL_COMMUNITY): Payer: Medicare Other

## 2022-07-02 DIAGNOSIS — J9601 Acute respiratory failure with hypoxia: Secondary | ICD-10-CM | POA: Diagnosis not present

## 2022-07-02 DIAGNOSIS — I5043 Acute on chronic combined systolic (congestive) and diastolic (congestive) heart failure: Secondary | ICD-10-CM | POA: Diagnosis not present

## 2022-07-02 LAB — D-DIMER, QUANTITATIVE: D-Dimer, Quant: 6.96 ug/mL-FEU — ABNORMAL HIGH (ref 0.00–0.50)

## 2022-07-02 LAB — BASIC METABOLIC PANEL
Anion gap: 6 (ref 5–15)
BUN: 27 mg/dL — ABNORMAL HIGH (ref 8–23)
CO2: 39 mmol/L — ABNORMAL HIGH (ref 22–32)
Calcium: 7.8 mg/dL — ABNORMAL LOW (ref 8.9–10.3)
Chloride: 97 mmol/L — ABNORMAL LOW (ref 98–111)
Creatinine, Ser: 1.51 mg/dL — ABNORMAL HIGH (ref 0.61–1.24)
GFR, Estimated: 46 mL/min — ABNORMAL LOW (ref >60)
Glucose, Bld: 135 mg/dL — ABNORMAL HIGH (ref 70–99)
Potassium: 3.4 mmol/L — ABNORMAL LOW (ref 3.5–5.1)
Sodium: 142 mmol/L (ref 135–145)

## 2022-07-02 LAB — C-REACTIVE PROTEIN: CRP: 10.9 mg/dL — ABNORMAL HIGH (ref <1.0)

## 2022-07-02 LAB — GLUCOSE, CAPILLARY
Glucose-Capillary: 98 mg/dL (ref 70–99)
Glucose-Capillary: 80 mg/dL (ref 70–99)
Glucose-Capillary: 107 mg/dL — ABNORMAL HIGH (ref 70–99)
Glucose-Capillary: 112 mg/dL — ABNORMAL HIGH (ref 70–99)
Glucose-Capillary: 144 mg/dL — ABNORMAL HIGH (ref 70–99)
Glucose-Capillary: 205 mg/dL — ABNORMAL HIGH (ref 70–99)

## 2022-07-02 LAB — PROCALCITONIN: Procalcitonin: 0.14 ng/mL

## 2022-07-02 LAB — SEDIMENTATION RATE: Sed Rate: 119 mm/hr — ABNORMAL HIGH (ref 0–16)

## 2022-07-02 LAB — MAGNESIUM: Magnesium: 2.1 mg/dL (ref 1.7–2.4)

## 2022-07-02 LAB — SURGICAL PATHOLOGY

## 2022-07-02 LAB — LACTIC ACID, PLASMA: Lactic Acid, Venous: 2.1 mmol/L (ref 0.5–1.9)

## 2022-07-02 LAB — BRAIN NATRIURETIC PEPTIDE: B Natriuretic Peptide: 1065.1 pg/mL — ABNORMAL HIGH (ref 0.0–100.0)

## 2022-07-02 LAB — PHOSPHORUS: Phosphorus: 2.3 mg/dL — ABNORMAL LOW (ref 2.5–4.6)

## 2022-07-02 MED ORDER — FLUCONAZOLE 100 MG PO TABS
100.0000 mg | ORAL_TABLET | Freq: Every day | ORAL | Status: DC
  Administered 2022-07-03 – 2022-07-06 (×3): 100 mg via ORAL
  Filled 2022-07-02 (×4): qty 1

## 2022-07-02 MED ORDER — POTASSIUM CHLORIDE CRYS ER 20 MEQ PO TBCR
40.0000 meq | EXTENDED_RELEASE_TABLET | Freq: Once | ORAL | Status: AC
  Administered 2022-07-02: 40 meq via ORAL
  Filled 2022-07-02: qty 2

## 2022-07-02 MED ORDER — FLUCONAZOLE 100 MG PO TABS
200.0000 mg | ORAL_TABLET | Freq: Once | ORAL | Status: AC
  Administered 2022-07-02: 200 mg via ORAL
  Filled 2022-07-02: qty 2

## 2022-07-02 NOTE — Progress Notes (Signed)
SLP Cancellation Note  Patient Details [name]. MRN: [id number] DOB: Aug 13, 1941   Cancelled treatment:       Reason Eval/Treat Not Completed: Medical issues which prohibited therapy. Per PT, patient had to go on BiPAP due to increased oxygen requirements.  Sonia Baller, MA, CCC-SLP Speech Therapy

## 2022-07-02 NOTE — Progress Notes (Addendum)
NAME:  Paul Sandoval., MRN:  [id number], DOB:  1941-07-11, LOS: 38 ADMISSION DATE:  05/28/2022, CONSULTATION DATE:  10/26 REFERRING MD:  Dr. Cyndia Skeeters, [facility] COMPLAINT:  Dyspnea   History of Present Illness:  81 y/o male admitted with acute respiratory failure with hypoxemia in the setting of COVID 19  pneumonia.  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  He is followed by Dr. Elsworth Soho in the pulmonary office.  Pertinent  Medical History  Former smoker, COPD, Chronic HFrEF (03/2022> EF 30-35%/ G1DD/ mildly reduced RVSF)/ HFpEF, HLD, CKD 3b, anemia of chronic disease; stage IIIa NSCLC RUL treated with chemo/rad 2017  Significant Hospital Events: Including procedures, antibiotic start and stop dates in addition to other pertinent events   10/23 Admitted to [place] - PCR POSITIVe CRP 3.4 10/24 D-dimer 2.5 10/25 Increased O2 need to heated high flow. PCCM consulted 10/26 attempted right thoracentesis, no fluid obtained 10/26 CT-PA > no PE.  Right lower lobe consolidation with mucous, obstructing material right lower lobe and right middle lobe bronchi.  Left lower lobe and lingular airspace disease.  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  Right basilar pleural 10.8 cm fluid collection 10/28 worse infiltrates on CXR  10/30 solumedrol added, lasix to 29m BID  11/01  INTUBATED cont on unasyn  D-dimer 17.6 CRP 10.8 BRONCH BAL 11/03 extubated 11/5 - C/o hoarseness.  Has dry cough.  Still on HFNC  Interim History / Subjective:   07/02/22 - Few canddai ain BAL from 07/03/2022. Afebrile. Normal WBC. On 9L HFNC. RNsays easy desats when OOB to 80s and needs 15L Outlook. Voice stil very hoarse.   Objective   Blood pressure 115/65, pulse 100, temperature 98 F (36.7 C), temperature source Axillary, resp. rate 16, height _0  (1.778 m), weight 68.8 kg, SpO2 100 %.        Intake/Output Summary (Last 24 hours) at  07/02/2022 1015 Last data filed at 07/02/2022 0427 Gross per 24 hour  Intake 600 ml  Output 700 ml  Net -100 ml   Filed Weights   06/29/22 0500 07/01/22 0500 07/02/22 0427  Weight: 70 kg 70.8 kg 68.8 kg    Examination:  General Appearance:  Looks stable but weal HOARSE VOICE Head:  Normocephalic, without obvious abnormality, atraumatic Eyes:  PERRL - yes, conjunctiva/corneas - muddy     Ears:  Normal external ear canals, both ears Nose:  G tube - no but has [place] Throat:  ETT TUBE - no , OG tube - no Neck:  Supple,  No enlargement/tenderness/nodules Lungs:  Mild tachypnea. Mild acc muscle use. CRACKLES at base. No overt distess. HOARSE VOICE + Heart:  S1 and S2 normal, no murmur, CVP - no.  Pressors - no Abdomen:  Soft, no masses, no organomegaly Genitalia / Rectal:  Not done Extremities:  Extremities- intac Skin:  ntact in exposed areas . Sacral area - none Neurologic:  Sedation - none -> RASS - +1 . Moves all 4s - ye. CAM-ICU - neg . Orientation - x3+     Resolved Hospital Problem list     Assessment & Plan:   Acute on chronic hypoxic/hypercapnic respiratory failure from COPD/asthma exacerbation, COVID positive from 06/17/2022, and multifocal pneumonia. 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 Hx of Rt upper long lung cancer.  11/6 - slow faiure to improve despite daily lasix and Solumedroil 611m  IV daily.  On day 14 abx (on unasyn). Last doppler 07/03/2022 - neg   Plan  - check bnp, trop and repeat echo (last echo aug 2023)  - check d-dimer,   - check CRP, ESR - check PCT - check CXR - START BIPAP 2h on day time and QHS - RX  - - adjust oxygen to keep SpO2 90 to 95% - - day 14 Abx =- ? Dc - TRH MD to decide - continue brovana, pulmicort, yupelri - bronchial hygiene - continue solumedrol  - cotninue daily lasix  Candida in BAL 07/19/2022  - lilkely contaminant but he is immune suppressed an dnot improving  Plan  -  check qtc and if ok start difluxan 7d course  Long QTc - 587mec on EKG 06/30/2022  Plan  -check EKG 07/02/2022   Hoarseness. - - could be related to recent intubation  11/6 - still +ve despite sterpids  Plan -Consider ENT assessment this week    Chronic HFrEF. RBBB. HTN, HLD, CAD. CKD 3b. Urine retention. Anemia of critical illness and chronic disease. Thrombocytopenia in setting of critical illness. GERD. - per primary team     SIGNATURE    Dr. MBrand Males M.D., F.C.C.P,  Pulmonary and Critical Care Medicine Staff Physician, [place]villeDirector - Interstitial Lung Disease  Program  Medical Director - [facility] [place] [address]  Pager: [phone] If no answer  ->[place] [phone] Telephone (clinical office): [phone] Telephone (research): [phone]  10:15 AM 07/02/2022    LABS    PULMONARY  Latest Reference Range & Units 06/19/22 04:10 06/20/22 04:08 06/21/22 04:10 06/22/22 02:55 06/23/22 02:52 06/24/22 02:50 06/25/22 03:00 06/27/22 02:49  D-Dimer, Quant 0.00 - 0.50 ug/mL-FEU 2.49 (H) 2.64 (H) 7.99 (H) 7.70 (H) 8.51 (H) 8.95 (H) 11.37 (H) 17.65 (H)  (H): Data is abnormally high   CBC Recent Labs  Lab 06/29/22 0258 06/30/22 0913 07/01/22 0347  HGB 8.8* 8.6* 8.6*  HCT 28.6* 29.6* 29.5*  WBC 5.9 7.7 6.8  PLT 59* 65* 68*    COAGULATION No results for input(s): "INR" in the last 168 hours.  CARDIAC  No results for input(s): "TROPONINI" in the last 168 hours. No results for input(s): "PROBNP" in the last 168 hours.   CHEMISTRY Recent Labs  Lab 06/26/22 0255 06/27/22 0243 06/27/22 0249 06/27/22 2322 07/20/2022 0127 07/06/2022 0501 06/29/22 0258 06/30/22 0913 07/01/22 0347 07/02/22 0655  NA 141 143  --   --    < > 142 143 142 143 142  K 3.3* 4.1  --   --    < > 3.4* 3.7 3.5 3.5 3.4*  CL 96* 96*  --   --    < >  92* 95* 98 100 97*  CO2 36* 38*  --   --    < > 37* 38* 35* 35* 39*  GLUCOSE 115* 110*  --   --    < > 123* 137* 121* 93 135*  BUN 44* 42*  --   --    < > 40* 41* 40* 39* 27*  CREATININE 1.54* 1.70*  --   --    < > 1.56* 1.64* 1.75* 1.65* 1.51*  CALCIUM 7.7* 7.9*  --   --    < > 8.0* 7.8* 7.9* 7.8* 7.8*  MG 2.2  --  2.2 2.0  --  2.3 2.3  --   --   --   PHOS 3.0 2.8  --   --   --   --  3.7  --   --   --    < > = values in this interval not displayed.   Estimated Creatinine Clearance: 36.7 mL/min (A) (by C-G formula based on SCr of 1.51 mg/dL (H)).   LIVER Recent Labs  Lab 06/26/22 0255 06/27/22 0243  ALBUMIN 2.1* 2.1*     INFECTIOUS No results for input(s): "LATICACIDVEN", "PROCALCITON" in the last 168 hours.   ENDOCRINE CBG (last 3)  Recent Labs    07/02/22 0010 07/02/22 0416 07/02/22 0737  GLUCAP 98 80 107*         IMAGING x48h  - image(s) personally visualized  -   highlighted in bold No results found.

## 2022-07-02 NOTE — Progress Notes (Signed)
OT Cancellation Note  Patient Details [name]. MRN: [id number] DOB: Feb 03, 1941   Cancelled Treatment:    Reason Eval/Treat Not Completed: Medical issues which prohibited therapy Patient currently on BiPAP with nursing asking for therapy to hold at this time. OT to continue to follow and check back as schedule will allow.  Rennie Plowman, MS Acute Rehabilitation Department Office# [phone]  Marcellina Millin 07/02/2022, 2:27 PM

## 2022-07-02 NOTE — Care Management Important Message (Signed)
Important Message  Patient Details IM Letter given [name]. MRN: [id number] Date of Birth: 20-Dec-1940   Medicare Important Message Given:  Yes     Kerin Salen 07/02/2022, 1:19 PM

## 2022-07-02 NOTE — Progress Notes (Signed)
PROGRESS NOTE  Paul Sandoval.  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  He was discharged positive for COVID.  He was found to be in acute hypoxic respiratory failure with hypoxia secondary to CHF exacerbation, pneumonia and COPD exacerbation.  He developed increased respiratory distress during this hospitalization and required to be transferred to [facility] service.  CTA chest showed multifocal pneumonia concerning for aspiration pneumonia, obstructive endobronchial material within the right lower lobe bronchus, near occlusive material in the right middle lobe bronchus, dense consolidation of the right lower lobe and left lower lobe.  Was intubated by ICU on 11/1, now extubated and transferred back to [facility] service on 11/4. Hospital course remarkable  for persistent respiratory distress, cough increased oxygen requirement.PCCM following  Significant events:  10/23 Admitted to [facility] 10/25 Increased O2 need to heated high flow. PCCM consulted 10/26 attempted right thoracentesis, no fluid obtained 10/26 CT-PA > no PE.  Right lower lobe consolidation with mucous, obstructing material right lower lobe and right middle lobe bronchi.  Left lower lobe and lingular airspace disease.  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  Right basilar pleural 10.8 cm fluid collection 10/28 worse infiltrates on CXR  10/30 solumedrol added, lasix to 66m BID  11/1 cont on unasyn. Intubated, bronch at bedside w mucus plugging 11/2 bronch w cryo, debulking of abnormal soft tissue  occluding part of trachea, RML RLL 11/3 extubate, decr lasix to qD, wean solumedrol     Assessment & Plan:  Principal Problem:   Acute on chronic combined systolic and diastolic congestive heart failure ([place] Problems:   HLD (hyperlipidemia)   Essential hypertension   GERD (gastroesophageal reflux disease)   CAD (coronary artery disease)   Thrombocytopenia (HCC)   Stage 3b chronic kidney disease (CKD) ([place]   Primary cancer of right upper lobe of lung (HCC)   Goals of care, counseling/discussion   Pleural effusion on right   Cardiomyopathy, ischemic   History of endovascular stent graft for abdominal aortic aneurysm (AAA)   Chronic obstructive pulmonary disease with acute exacerbation (HCC)   Acute respiratory failure with hypoxia (HCC)   Anemia of chronic kidney failure   COVID-19 virus infection   Aspiration pneumonia (HCC)   Urinary retention   Multifocal pneumonia   Hypotension due to drugs   Malnutrition of moderate degree   Acute on chronic respiratory failure with hypoxia and hypercapnia (HCC)   Acute on chronic hypoxic respiratory failure: Secondary to multifocal pneumonia, systolic CHF  exacerbation, lung cancer, COVID. Was intubated on 9/1, extubated on 11/3. Underwent bronchoscopy for right lower lobe obstruction, right middle lobe and tracheal partial obstruction due to abnormal soft tissue, status post debulking.  Has chronic right upper lobe fibrosis, chronic right pleural effusion.  History of VATS, talc pleurodesis. Pathology report from bronc on 11/2 showed yeast but no malignant cells.  Started on Diflucan.  Stopped unasyn Currently on Solu-Medrol, will continue to taper.  Continue YCandiss Norse budesonide. Completed course of molnupiravir for COVID He follows with Dr. AElsworth Soho This morning he was on 10 L of oxygen.We have  ordered hypertonic saline nebulization, chest  physiotherapy. PCCM also following.  Patient also has developed hoarseness,we will  touch base with ENT.Message sent to Dr Janace Hoard  Acute on chronic systolic CHF: Takes Lasix 20 mg daily at home.  Currently on 40 mg IV daily.  Continue to monitor input/output, daily weights.  Continue Entresto, metoprolol. Echocardiogram on May 2023 showed EF of 41%, grade 1 diastolic dysfunction.  History of coronary artery disease: No anginal symptoms.  Continue Entresto, metoprolol, Plavix. s/p CABG in 1996 and PCI to dLM and SVG-D in 02/2014:    CKD stage IIIb, urine retention: Continue Foley.  On Flomax.  We will give a voiding  trial when appropriate, patient does not want to do the Foley out now.  Currently kidney function at baseline.  Baseline creatinine around 1.8-2  Acute on chronic thrombocytopenia/chronic anemia/elevated D-dimer: Duplex negative on 10/26, 11/2.  Continue DVT prophylaxis, Plavix.  Continue to monitor CBC.  Platelets low but stable  GERD: Continue Pepcid  Hyperlipidemia: On crestor  Debility/deconditioning: PT/OT consulted       Nutrition Problem: Moderate Malnutrition Etiology: chronic illness (COPD, CHF, and new fungating soft tissue mass)    DVT prophylaxis:enoxaparin (LOVENOX) injection 30 mg Start: 06/27/22 1000 Place TED hose Start: 06/19/22 1130 SCDs Start: 06/11/2022 1016     Code Status: Full Code  Family Communication: Called and discussed with son on phone on 11/5  Patient status:Inpatient  Patient is from :Home  Anticipated discharge LK:GMWN  Estimated DC date:not sure, still on high flow oxygen   Consultants: PCCM  Procedures:Intubation,bronch  Antimicrobials:  Anti-infectives (From admission, onward)    Start     Dose/Rate Route Frequency Ordered Stop   07/03/22 1000  fluconazole (DIFLUCAN) tablet 100 mg        100 mg Oral Daily 07/02/22 1342 07/09/22 0959   07/02/22 1430  fluconazole (DIFLUCAN) tablet 200 mg        200 mg Oral  Once 07/02/22 1342     06/23/22 0600  Ampicillin-Sulbactam (UNASYN) 3 g in sodium chloride 0.9 %  100 mL IVPB        3 g 200 mL/hr over 30 Minutes Intravenous Every 6 hours 06/22/22 1739 August 03, 2022 0759   06/19/22 2200  molnupiravir EUA (LAGEVRIO) capsule 800 mg        4 capsule Oral 2 times daily 06/19/22 1514 06/24/22 2159   06/19/22 1000  azithromycin (ZITHROMAX) 500 mg in sodium chloride 0.9 % 250 mL IVPB  Status:  Discontinued        500 mg 250 mL/hr over 60 Minutes Intravenous Every 24 hours 05/27/2022 1931 06/22/22 1711   06/19/22 1000  cefTRIAXone (ROCEPHIN) 1 g in sodium chloride 0.9 % 100 mL IVPB        1 g 200 mL/hr over 30 Minutes Intravenous Every 24 hours 06/16/2022 2120 06/22/22 1026   06/09/2022 2200  nirmatrelvir/ritonavir EUA (renal dosing) (PAXLOVID) 2 tablet  Status:  Discontinued       Note to Pharmacy: Please adjust or change treatment as needed.   2 tablet Oral 2 times daily 05/31/2022 1929 06/19/22 1514   06/03/2022 2030  cefTRIAXone (ROCEPHIN) 1 g in sodium chloride 0.9 % 100 mL IVPB  Status:  Discontinued        1 g 200 mL/hr over 30 Minutes Intravenous Every 24 hours 05/29/2022 1931 06/05/2022 2120   06/09/2022 1000  levofloxacin (LEVAQUIN) tablet 500 mg  Status:  Discontinued        500 mg Oral Daily 06/08/2022 0943 06/10/2022 1943  Subjective:  Patient seen and examined at the bedside today.  Sitting in the bed.  Overall comfortable but still struggling with some cough, dyspnea.  On 10 to 11 L of oxygen high flow.  In complaints yesterday, he feels better.  Still bringing up a lot of phlegm  Objective: Vitals:   07/02/22 0300 07/02/22 0427 07/02/22 0943 07/02/22 1205  BP:  (!) 112/58 115/65 (!) 105/48  Pulse:  94 100 88  Resp:  16  (!) 25  Temp:  98 F (36.7 C)  97.9 F (36.6 C)  TempSrc:  Axillary  Oral  SpO2: 95% 100%  100%  Weight:  68.8 kg    Height:        Intake/Output Summary (Last 24 hours) at 07/02/2022 1342 Last data filed at 07/02/2022 0427 Gross per 24 hour  Intake 600 ml  Output 700 ml  Net -100 ml   Filed Weights   06/29/22 0500 07/01/22  0500 07/02/22 0427  Weight: 70 kg 70.8 kg 68.8 kg    Examination:  General exam: Weak, deconditioned pleasant elderly gentleman HEENT: PERRL Respiratory system: Scattered crackles, rhonchi Cardiovascular system: S1 & S2 heard, RRR.  Gastrointestinal system: Abdomen is nondistended, soft and nontender. Central nervous system: Alert and oriented Extremities: No edema, no clubbing ,no cyanosis Skin: No rashes, no ulcers,no icterus  GU: Foley    Data Reviewed: I have personally reviewed following labs and imaging studies  CBC: Recent Labs  Lab 06/27/22 0249 07/08/2022 0501 06/29/22 0258 06/30/22 0913 07/01/22 0347  WBC 8.2 11.0* 5.9 7.7 6.8  HGB 9.8* 9.8* 8.8* 8.6* 8.6*  HCT 31.8* 33.4* 28.6* 29.6* 29.5*  MCV 87.6 88.1 87.5 90.2 89.4  PLT 56* 65* 59* 65* 68*   Basic Metabolic Panel: Recent Labs  Lab 06/26/22 0255 06/27/22 0243 06/27/22 0249 06/27/22 2322 07/08/2022 0127 07/21/2022 0501 06/29/22 0258 06/30/22 0913 07/01/22 0347 07/02/22 0655  NA 141 143  --   --    < > 142 143 142 143 142  K 3.3* 4.1  --   --    < > 3.4* 3.7 3.5 3.5 3.4*  CL 96* 96*  --   --    < > 92* 95* 98 100 97*  CO2 36* 38*  --   --    < > 37* 38* 35* 35* 39*  GLUCOSE 115* 110*  --   --    < > 123* 137* 121* 93 135*  BUN 44* 42*  --   --    < > 40* 41* 40* 39* 27*  CREATININE 1.54* 1.70*  --   --    < > 1.56* 1.64* 1.75* 1.65* 1.51*  CALCIUM 7.7* 7.9*  --   --    < > 8.0* 7.8* 7.9* 7.8* 7.8*  MG 2.2  --  2.2 2.0  --  2.3 2.3  --   --   --   PHOS 3.0 2.8  --   --   --   --  3.7  --   --   --    < > = values in this interval not displayed.     Recent Results (from the past 240 hour(s))  Culture, Respiratory w Gram Stain     Status: None   Collection Time: 06/27/22  1:26 PM   Specimen: Bronchoalveolar Lavage; Respiratory  Result Value Ref Range Status   Specimen Description   Final    BRONCHIAL ALVEOLAR LAVAGE Performed at [facility], [address] [place], [place]  [address]    Special Requests   Final    NONE Performed at [facility], [place] [address] [place], [place] [address]    Gram Stain   Final    NO WBC SEEN RARE BUDDING YEAST SEEN Performed at [facility], [place] [address] [place], [place] [address]    Culture FEW CANDIDA ALBICANS  Final   Report Status 06/30/2022 FINAL  Final     Radiology Studies: DG CHEST PORT 1 VIEW  Result Date: 07/02/2022 CLINICAL DATA:  Respiratory failure, hypoxia EXAM: PORTABLE CHEST 1 VIEW COMPARISON:  Previous studies including the examination of 06/27/2022 FINDINGS: Transverse diameter of heart is increased. There is previous coronary bypass surgery. There is homogeneous infiltrate in right upper lung field with no significant change. There are patchy alveolar densities in both lower lung fields. There is blunting of both lateral CP angles patient there is interval removal of endotracheal tube and and enteric tube. IMPRESSION: There are alveolar and interstitial infiltrates in right upper lobe and both lower lobes with no significant interval change. Findings suggest multifocal pneumonia. Small bilateral pleural effusions. Electronically Signed   By: Elmer Picker M.D.   On: 07/02/2022 11:49    Scheduled Meds:  arformoterol  15 mcg Nebulization BID   budesonide (PULMICORT) nebulizer solution  0.25 mg Nebulization BID   Chlorhexidine Gluconate Cloth  6 each Topical Daily   clopidogrel  75 mg Oral Daily   docusate  100 mg Oral BID   enoxaparin (LOVENOX) injection  30 mg Subcutaneous Q24H   famotidine  20 mg Oral Daily   [START ON 07/03/2022] fluconazole  100 mg Oral Daily   fluconazole  200 mg Oral Once   furosemide  40 mg Intravenous Daily   guaiFENesin  15 mL Oral Q12H   methylPREDNISolone (SOLU-MEDROL) injection  60 mg Intravenous Daily   metoprolol tartrate  37.5 mg Oral BID   polyethylene glycol  17 g Oral Daily   revefenacin  175 mcg Nebulization Daily   rosuvastatin  20 mg  Oral QHS   sacubitril-valsartan  1 tablet Oral BID   sodium chloride HYPERTONIC  4 mL Nebulization Daily   Continuous Infusions:  sodium chloride Stopped (07/10/2022 1222)   ampicillin-sulbactam (UNASYN) IV 3 g (07/02/22 0853)     LOS: 13 days   Shelly Coss, MD [facility] P11/01/2022, 1:42 PM

## 2022-07-02 NOTE — Progress Notes (Signed)
CPT not done due to pt not available.

## 2022-07-02 NOTE — Progress Notes (Signed)
Per RN pt wore BIPAP for I hour. Pt stated he could not tolerate.

## 2022-07-02 NOTE — Progress Notes (Signed)
PT Cancellation Note  Patient Details Name: Micael Barb. MRN: [id number] DOB: 08-Jun-1941   Cancelled Treatment:    Reason Eval/Treat Not Completed: Medical issues which prohibited therapy RN reports she assisted pt to recliner this morning and he required increased oxygen.  Pt now currently on BiPAP and would not tolerate PT at this time per RN.  Will check back as schedule permits.   Myrtis Hopping [place] 07/02/2022, 2:16 PM Jannette Spanner PT, DPT Physical Therapist Acute Rehabilitation Services Preferred contact method: Secure Chat Weekend Pager Only: [phone] Office: [phone]

## 2022-07-03 ENCOUNTER — Inpatient Hospital Stay (HOSPITAL_COMMUNITY): Payer: Medicare Other

## 2022-07-03 DIAGNOSIS — I5043 Acute on chronic combined systolic (congestive) and diastolic (congestive) heart failure: Secondary | ICD-10-CM | POA: Diagnosis not present

## 2022-07-03 DIAGNOSIS — R0603 Acute respiratory distress: Secondary | ICD-10-CM

## 2022-07-03 DIAGNOSIS — J9601 Acute respiratory failure with hypoxia: Secondary | ICD-10-CM | POA: Diagnosis not present

## 2022-07-03 LAB — GLUCOSE, CAPILLARY
Glucose-Capillary: 115 mg/dL — ABNORMAL HIGH (ref 70–99)
Glucose-Capillary: 117 mg/dL — ABNORMAL HIGH (ref 70–99)
Glucose-Capillary: 130 mg/dL — ABNORMAL HIGH (ref 70–99)
Glucose-Capillary: 165 mg/dL — ABNORMAL HIGH (ref 70–99)
Glucose-Capillary: 94 mg/dL (ref 70–99)
Glucose-Capillary: 94 mg/dL (ref 70–99)

## 2022-07-03 LAB — CBC
HCT: 31 % — ABNORMAL LOW (ref 39.0–52.0)
Hemoglobin: 8.8 g/dL — ABNORMAL LOW (ref 13.0–17.0)
MCH: 26.4 pg (ref 26.0–34.0)
MCHC: 28.4 g/dL — ABNORMAL LOW (ref 30.0–36.0)
MCV: 93.1 fL (ref 80.0–100.0)
Platelets: 78 10*3/uL — ABNORMAL LOW (ref 150–400)
RBC: 3.33 MIL/uL — ABNORMAL LOW (ref 4.22–5.81)
RDW: 16.7 % — ABNORMAL HIGH (ref 11.5–15.5)
WBC: 10.5 10*3/uL (ref 4.0–10.5)
nRBC: 0 % (ref 0.0–0.2)

## 2022-07-03 LAB — ECHOCARDIOGRAM COMPLETE
AR max vel: 2.54 cm2
AV Area VTI: 2.79 cm2
AV Area mean vel: 2.41 cm2
AV Mean grad: 3 mmHg
AV Peak grad: 4.4 mmHg
Ao pk vel: 1.05 m/s
Area-P 1/2: 3.65 cm2
Calc EF: 35.4 %
Height: 70 in
MV M vel: 1.88 m/s
MV Peak grad: 14.1 mmHg
S' Lateral: 3.8 cm
Single Plane A2C EF: 27.7 %
Single Plane A4C EF: 36.5 %
Weight: 2483.2 oz

## 2022-07-03 LAB — BASIC METABOLIC PANEL
Anion gap: 8 (ref 5–15)
BUN: 34 mg/dL — ABNORMAL HIGH (ref 8–23)
CO2: 35 mmol/L — ABNORMAL HIGH (ref 22–32)
Calcium: 8 mg/dL — ABNORMAL LOW (ref 8.9–10.3)
Chloride: 102 mmol/L (ref 98–111)
Creatinine, Ser: 1.68 mg/dL — ABNORMAL HIGH (ref 0.61–1.24)
GFR, Estimated: 40 mL/min — ABNORMAL LOW (ref >60)
Glucose, Bld: 115 mg/dL — ABNORMAL HIGH (ref 70–99)
Potassium: 3.6 mmol/L (ref 3.5–5.1)
Sodium: 145 mmol/L (ref 135–145)

## 2022-07-03 LAB — TROPONIN I (HIGH SENSITIVITY): Troponin I (High Sensitivity): 126 ng/L (ref <18)

## 2022-07-03 LAB — MAGNESIUM: Magnesium: 2.3 mg/dL (ref 1.7–2.4)

## 2022-07-03 LAB — PHOSPHORUS: Phosphorus: 3.7 mg/dL (ref 2.5–4.6)

## 2022-07-03 LAB — SAR COV2 SEROLOGY (COVID19)AB(IGG),IA: SARS-CoV-2 Ab, IgG: REACTIVE — AB

## 2022-07-03 MED ORDER — PANTOPRAZOLE SODIUM 40 MG IV SOLR
40.0000 mg | Freq: Two times a day (BID) | INTRAVENOUS | Status: DC
  Administered 2022-07-03 – 2022-07-07 (×8): 40 mg via INTRAVENOUS
  Filled 2022-07-03 (×8): qty 10

## 2022-07-03 MED ORDER — GERHARDT'S BUTT CREAM
TOPICAL_CREAM | Freq: Three times a day (TID) | CUTANEOUS | Status: DC
  Filled 2022-07-03 (×2): qty 1

## 2022-07-03 MED ORDER — FUROSEMIDE 10 MG/ML IJ SOLN
60.0000 mg | Freq: Two times a day (BID) | INTRAMUSCULAR | Status: DC
  Administered 2022-07-03 – 2022-07-05 (×5): 60 mg via INTRAVENOUS
  Filled 2022-07-03 (×5): qty 6

## 2022-07-03 MED ORDER — PANTOPRAZOLE SODIUM 40 MG PO TBEC
40.0000 mg | DELAYED_RELEASE_TABLET | Freq: Two times a day (BID) | ORAL | Status: DC
  Administered 2022-07-03: 40 mg via ORAL
  Filled 2022-07-03: qty 1

## 2022-07-03 NOTE — Progress Notes (Signed)
NAME:  Paul Gorniak., MRN:  [id number], DOB:  05-05-1941, LOS: 37 ADMISSION DATE:  05/29/2022, CONSULTATION DATE:  10/26 REFERRING MD:  Dr. Cyndia Skeeters, [facility]IEF COMPLAINT:  Dyspnea   History of Present Illness:  81 y/o male admitted with acute respiratory failure with hypoxemia in the setting of COVID 19  pneumonia.  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  He is followed by Dr. Elsworth Soho in the pulmonary office.  Pertinent  Medical History  Former smoker, COPD, Chronic HFrEF (03/2022> EF 30-35%/ G1DD/ mildly reduced RVSF)/ HFpEF, HLD, CKD 3b, anemia of chronic disease; stage IIIa NSCLC RUL treated with chemo/rad 2017  Significant Hospital Events: Including procedures, antibiotic start and stop dates in addition to other pertinent events   03/28/22 baseline ECHO LVEF 35% - same since 2021 (ws 20-25% in 2019 and 2018) 10/23 Admitted to [facility] - PCR POSITIVe CRP 3.4 10/24 D-dimer 2.5 10/25 Increased O2 need to heated high flow. PCCM consulted 10/26 attempted right thoracentesis, no fluid obtained LE Duplex - neg DVT 10/26 CT-PA > no PE.  Right lower lobe consolidation with mucous, obstructing material right lower lobe and right middle lobe bronchi.  Left lower lobe and lingular airspace disease.  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  Right basilar pleural 10.8 cm fluid collection 10/28 worse infiltrates on CXR  10/30 solumedrol added, lasix to 33m BID  11/01  INTUBATED cont on unasyn  D-dimer 17.6 CRP 10.8 BRONCH BAL - CANDIDA 11/2 - Duplex LE - negative DVT 11/03 extubated 11/5 - C/o hoarseness.  Has dry cough.  Still on HFNC 07/02/22 - Few canddai ain BAL from 07/23/2022. Afebrile. Normal WBC. On 9L HFNC. RNsays easy desats when OOB to 80s and needs 15L [place]. Voice stil very hoarse.  QTC < 5038mc and [facility] started Diflucan for Candida in BAL ENT consult -for hoarsenss - no scope but being  monitored Unasyn stopped (2 week course) SEd rate 119/CRP 11 BNP 1100. Trop 177 ->126 D-dimer better 6.96  Interim History / Subjective:   11/7 - BNP > 1100 yesteray. Trop 177-> 126 x 24h. REpeat ECHO pending. Only did BiPAP x 45 min last night and could not tolerate. sTill on 15L [place] and pulse 97%  Objective   Blood pressure (!) 115/53, pulse 99, temperature 98.1 F (36.7 C), temperature source Oral, resp. rate (!) 21, height _0  (1.778 m), weight 70.4 kg, SpO2 100 %.    FiO2 (%):  [100 %] 100 %   Intake/Output Summary (Last 24 hours) at 07/03/2022 1013 Last data filed at 07/03/2022 0500 Gross per 24 hour  Intake --  Output 1100 ml  Net -1100 ml   Filed Weights   07/01/22 0500 07/02/22 0427 07/03/22 0500  Weight: 70.8 kg 68.8 kg 70.4 kg    Examination: General Appearance:  Looks deconditioned but sitting on chair and looking better. HOARSE VOICe Head:  Normocephalic, without obvious abnormality, atraumatic Eyes:  PERRL - yes, conjunctiva/corneas - muddy     Ears:  Normal external ear canals, both ears Nose:  G tube - 15L [place] butno G tube Throat:  ETT TUBE - no , OG tube - no Neck:  Supple,  No enlargement/tenderness/nodules Lungs: Mild tachypea. Basal crackl;es Heart:  S1 and S2 normal, no murmur, CVP - no.  Pressors - no Abdomen:  Soft, no masses, no organomegaly Genitalia / Rectal:  Not done Extremities:  Extremities- intact Skin:  ntact in exposed areas . Sacral area - not examined Neurologic:  Sedation - none -> RASS - +1 . Moves all 4s - yes. CAM-ICU - neg . Orientation - x3+     Resolved Hospital Problem list     Assessment & Plan:   Acute on chronic hypoxic/hypercapnic respiratory failure from COPD/asthma exacerbation, COVID positive from 06/06/2022, and multifocal pneumonia. 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 Hx of Rt upper long lung cancer.  11/7 - slow faiure to improve despite daily lasix and  Solumedroil 73m IV daily.  LAbs indicating both inflammatory and s-chf componentn. Started on empiric diflucan  yesterday. Off unasayn since ysterday   Plan  - check echo - [facility] MD to follow result - increase lasix 625mdaily to 6029mid - [facility] MD to follow response  - continue solmedrol 29m64mily -> some point [facility] MD can chagne to 3-6 weeks pred taper - monito -BIPAP 2h on day time and QHS - unable to toelrate but can rechallenge if needed - - adjust oxygen to keep SpO2 > 95% - continue brovana, pulmicort, yupelri - bronchial hygiene  Candida in BAL 07/10/2022 - started 7d diflucan 116/23   Plan  - complete difluxan 7d course with QTC monitoring per [facility] MD  Long QTc - 515ms38mn EKG 07/26/2022 -> 486 on 07/02/22  Plan  -monitor per [facility]   Hoarseness. - - could be related to recent intubation  11/7  - improved. ENT monitoring without scope  Plan -monitor per ENT    Chronic HFrEF. RBBB. HTN, HLD, CAD. CKD 3b. Urine retention. Anemia of critical illness and chronic disease. Thrombocytopenia in setting of critical illness. GERD.   - per primary team   CCM will see again x 1 later in week Likely needs LTAC and goals of care  D/w Dr AdhikElzie Ringsr. MuralBrand Males., F.C.C.P,  Pulmonary and Critical Care Medicine Staff Physician, [facility]  [facility]  Medical Director - [id number]: [phone] answer  -> Check AMION or Try [phone] Telephone (clinical office): [phone] Telephone (research): [phone]  10:13 AM 07/03/2022    LABS    PULMONARY  Latest Reference Range & Units 06/19/22 04:10 06/20/22 04:08 06/21/22 04:10 06/22/22 02:55 06/23/22 02:52 06/24/22 02:50 06/25/22 03:00 06/27/22 02:49  D-Dimer, Quant 0.00 - 0.50 ug/mL-FEU 2.49 (H) 2.64 (H) 7.99  (H) 7.70 (H) 8.51 (H) 8.95 (H) 11.37 (H) 17.65 (H)  (H): Data is abnormally high   CBC Recent Labs  Lab 06/29/22 0258 06/30/22 0913 07/01/22 0347  HGB 8.8* 8.6* 8.6*  HCT 28.6* 29.6* 29.5*  WBC 5.9 7.7 6.8  PLT 59* 65* 68*    COAGULATION No results for input(s): "INR" in the last 168 hours.  CARDIAC  No results for input(s): "TROPONINI" in the last 168 hours. No results for input(s): "PROBNP" in the last 168 hours.   CHEMISTRY Recent Labs  Lab 06/27/22 0243 06/27/22 0249 06/27/22 2322 07/19/2022 0127 07/23/2022 0501 06/29/22 0258 06/30/22 0913 07/01/22 0347 07/02/22 0655 07/02/22 1234 07/03/22 0451  NA 143  --   --    < > 142 143 142 143 142  --  145  K 4.1  --   --    < > 3.4*  3.7 3.5 3.5 3.4*  --  3.6  CL 96*  --   --    < > 92* 95* 98 100 97*  --  102  CO2 38*  --   --    < > 37* 38* 35* 35* 39*  --  35*  GLUCOSE 110*  --   --    < > 123* 137* 121* 93 135*  --  115*  BUN 42*  --   --    < > 40* 41* 40* 39* 27*  --  34*  CREATININE 1.70*  --   --    < > 1.56* 1.64* 1.75* 1.65* 1.51*  --  1.68*  CALCIUM 7.9*  --   --    < > 8.0* 7.8* 7.9* 7.8* 7.8*  --  8.0*  MG  --    < > 2.0  --  2.3 2.3  --   --   --  2.1 2.3  PHOS 2.8  --   --   --   --  3.7  --   --   --  2.3* 3.7   < > = values in this interval not displayed.   Estimated Creatinine Clearance: 33.8 mL/min (A) (by C-G formula based on SCr of 1.68 mg/dL (H)).   LIVER Recent Labs  Lab 06/27/22 0243  ALBUMIN 2.1*     INFECTIOUS Recent Labs  Lab 07/02/22 1234  LATICACIDVEN 2.1*  PROCALCITON 0.14     ENDOCRINE CBG (last 3)  Recent Labs    07/02/22 2005 07/03/22 0515 07/03/22 0738  GLUCAP 205* 94 117*         IMAGING x48h  - image(s) personally visualized  -   highlighted in bold DG CHEST PORT 1 VIEW  Result Date: 07/02/2022 CLINICAL DATA:  Respiratory failure, hypoxia EXAM: PORTABLE CHEST 1 VIEW COMPARISON:  Previous studies including the examination of 06/27/2022 FINDINGS:  Transverse diameter of heart is increased. There is previous coronary bypass surgery. There is homogeneous infiltrate in right upper lung field with no significant change. There are patchy alveolar densities in both lower lung fields. There is blunting of both lateral CP angles patient there is interval removal of endotracheal tube and and enteric tube. IMPRESSION: There are alveolar and interstitial infiltrates in right upper lobe and both lower lobes with no significant interval change. Findings suggest multifocal pneumonia. Small bilateral pleural effusions. Electronically Signed   By: Elmer Picker M.D.   On: 07/02/2022 11:49

## 2022-07-03 NOTE — Consult Note (Signed)
Reason for Consult:hoarseness Referring Physician: Dr Isaias Cowman. is an 81 y.o. male.  HPI: hx of Covid and intubation. He was extubated few days ago. He is hoarse since. He is better today with voice. He had no hoarseness prior to admission. He is able to swallow ok. No pain in throat.   Past Medical History:  Diagnosis Date   AAA (abdominal aortic aneurysm) ([place]) 2010   4.4 cm 08/2008;4.44 in 7/10 and 4.65 in 08/2009; 4.8 by CT in 11/2009; 4.3 by ultrasound in 08/2010   Anemia    Arteriosclerotic cardiovascular disease (ASCVD) 1996   CABG-1996   Arthritis    "fingers" (03/18/2014)   CAD (coronary artery disease)    03/18/14:  PCI with DES to distal left main. 7/29: DES to the SVG to Diag   Cancer [facility])    Upper right lobe lung cancer   Cardiomyopathy, ischemic    Echo 03/17/14: EF 45-50%   CHF (congestive heart failure) (HCC)    Chronic bronchitis (HCC)    Chronic kidney disease    CRF   Chronic rhinitis    Colonic polyp 2002   polypectomy in 2002   COPD (chronic obstructive pulmonary disease) ([place])    Diverticulosis    Dyspnea    with exertion   ED (erectile dysfunction)    Encounter for antineoplastic chemotherapy 12/19/2015   GERD (gastroesophageal reflux disease)    History of blood transfusion    Hyperlipidemia    Hypertension    IFG (impaired fasting glucose)    Myocardial infarction [facility])    "told h/o silent MI sometime before 1996"   Pneumonia ~ 2001; ~ 2005    has had more than twice   Right bundle branch block    Tobacco abuse, in remission    40 pack year total consumption; discontinued in 1996    Past Surgical History:  Procedure Laterality Date   ABDOMINAL AORTIC ANEURYSM REPAIR  11/2012   ABDOMINAL AORTIC ENDOVASCULAR STENT GRAFT N/A 12/11/2012   Procedure: ABDOMINAL AORTIC ENDOVASCULAR STENT GRAFT;  Surgeon: Mal Misty, MD;  Location: [place];  Service: Vascular;  Laterality: N/A;  Ultrasound guided; [place]  06/30/2022    Procedure: BRONCHIAL WASHINGS;  Surgeon: Candee Furbish, MD;  Location: [facility];  Service: Endoscopy;;   CARDIAC CATHETERIZATION  01/08/1995   COLONOSCOPY  2002   polypectomy-patient denies   CORONARY ANGIOPLASTY WITH STENT PLACEMENT  03/18/2014   "1"   CORONARY ANGIOPLASTY WITH STENT PLACEMENT  03/24/2014   "1"   CORONARY ARTERY BYPASS GRAFT  01/09/1995   "CABG X3"   CRYOTHERAPY  06/29/2022   Procedure: CRYOTHERAPY;  Surgeon: Candee Furbish, MD;  Location: [facility];  Service: Endoscopy;;   FLEXIBLE BRONCHOSCOPY N/A 03/01/2017   Procedure: FLEXIBLE BRONCHOSCOPY WITH BIOPSIES;  Surgeon: Gaye Pollack, MD;  Location: [place] OR;  Service: Thoracic;  Laterality: N/A;   JOINT REPLACEMENT     LAPAROSCOPIC CHOLECYSTECTOMY  12/2009   LEFT AND RIGHT HEART CATHETERIZATION WITH CORONARY/GRAFT ANGIOGRAM N/A 03/18/2014   Procedure: LEFT AND RIGHT HEART CATHETERIZATION WITH Beatrix Fetters;  Surgeon: Blane Ohara, MD;  Location: [facility] CATH LAB;  Service: Cardiovascular;  Laterality: N/A;   PERCUTANEOUS CORONARY STENT INTERVENTION (PCI-S)  03/18/2014   Procedure: PERCUTANEOUS CORONARY STENT INTERVENTION (PCI-S);  Surgeon: Blane Ohara, MD;  Location: [facility] CATH LAB;  Service: Cardiovascular;;   PERCUTANEOUS CORONARY STENT INTERVENTION (PCI-S) N/A 03/24/2014   Procedure: PERCUTANEOUS CORONARY STENT INTERVENTION (  PCI-S);  Surgeon: Blane Ohara, MD;  Location[facility] LAB;  Service: Cardiovascular;  Laterality: N/A;   PLEURAL EFFUSION DRAINAGE Right 03/01/2017   Procedure: DRAINAGE OF PLEURAL EFFUSION;  Surgeon: Gaye Pollack, MD;  Location: [place] OR;  Service: Thoracic;  Laterality: Right;   RIGHT/LEFT HEART CATH AND CORONARY/GRAFT ANGIOGRAPHY N/A 05/15/2017   Procedure: RIGHT/LEFT HEART CATH AND CORONARY/GRAFT ANGIOGRAPHY;  Surgeon: Larey Dresser, MD;  Location: [place] CV LAB;  Service: Cardiovascular;  Laterality: N/A;   TALC PLEURODESIS Right 03/01/2017   Procedure: Pietro Cassis;   Surgeon: Gaye Pollack, MD;  Location: [place];  Service: Thoracic;  Laterality: Right;   TOTAL HIP ARTHROPLASTY Left 01/21/2013   Procedure: TOTAL HIP ARTHROPLASTY ANTERIOR APPROACH;  Surgeon: Mauri Pole, MD;  Location: [place];  Service: Orthopedics;  Laterality: Left;   VIDEO ASSISTED THORACOSCOPY Right 03/01/2017   Procedure: VIDEO ASSISTED THORACOSCOPY WITH BIOPSIES;  Surgeon: Gaye Pollack, MD;  Location: [place];  Service: Thoracic;  Laterality: Right;   VIDEO BRONCHOSCOPY N/A 11/17/2015   Procedure: VIDEO BRONCHOSCOPY WITH FLUORO;  Surgeon: Rigoberto Noel, MD;  Location: [place];  Service: Cardiopulmonary;  Laterality: N/A;   VIDEO BRONCHOSCOPY Bilateral 07/14/2022   Procedure: VIDEO BRONCHOSCOPY WITHOUT FLUORO;  Surgeon: Candee Furbish, MD;  [facility];  Service: Endoscopy;  Laterality: Bilateral;  Bedside    Family History  Problem Relation Age of Onset   Heart disease Father    Cancer Father        Lung   Arthritis Mother    Parkinsonism Mother    Arthritis Sister        Brother with rheumatoid arthritis   Hypertension Brother    Colon cancer Neg Hx    Colon polyps Neg Hx     Social History:  reports that he quit smoking about 27 years ago. His smoking use included cigarettes. He started smoking about 65 years ago. He has a 45.00 pack-year smoking history. He has never used smokeless tobacco. He reports that he does not drink alcohol and does not use drugs.  Allergies:  Allergies  Allergen Reactions   Neomycin Hives, Itching and Rash   Anoro Ellipta [Umeclidinium-Vilanterol] Itching and Rash    Rash over face, neck   Cetirizine & Related Rash   Stiolto Respimat [Tiotropium Bromide-Olodaterol] Itching and Rash    Rash over face , neck    Medications: I have reviewed the patient's current medications.  Results for orders placed or performed during the hospital encounter of 06/04/2022 (from the past 48 hour(s))  Glucose, capillary     Status: Abnormal    Collection Time: 07/01/22 11:47 AM  Result Value Ref Range   Glucose-Capillary 187 (H) 70 - 99 mg/dL    Comment: Glucose reference range applies only to samples taken after fasting for at least 8 hours.  Glucose, capillary     Status: Abnormal   Collection Time: 07/01/22  3:54 PM  Result Value Ref Range   Glucose-Capillary 263 (H) 70 - 99 mg/dL    Comment: Glucose reference range applies only to samples taken after fasting for at least 8 hours.  Glucose, capillary     Status: Abnormal   Collection Time: 07/01/22  9:29 PM  Result Value Ref Range   Glucose-Capillary 168 (H) 70 - 99 mg/dL    Comment: Glucose reference range applies only to samples taken after fasting for at least 8 hours.  Glucose, capillary     Status:  None   Collection Time: 07/02/22 12:10 AM  Result Value Ref Range   Glucose-Capillary 98 70 - 99 mg/dL    Comment: Glucose reference range applies only to samples taken after fasting for at least 8 hours.  Glucose, capillary     Status: None   Collection Time: 07/02/22  4:16 AM  Result Value Ref Range   Glucose-Capillary 80 70 - 99 mg/dL    Comment: Glucose reference range applies only to samples taken after fasting for at least 8 hours.  Basic metabolic panel     Status: Abnormal   Collection Time: 07/02/22  6:55 AM  Result Value Ref Range   Sodium 142 135 - 145 mmol/L   Potassium 3.4 (L) 3.5 - 5.1 mmol/L   Chloride 97 (L) 98 - 111 mmol/L   CO2 39 (H) 22 - 32 mmol/L   Glucose, Bld 135 (H) 70 - 99 mg/dL    Comment: Glucose reference range applies only to samples taken after fasting for at least 8 hours.   BUN 27 (H) 8 - 23 mg/dL   Creatinine, Ser 1.51 (H) 0.61 - 1.24 mg/dL   Calcium 7.8 (L) 8.9 - 10.3 mg/dL   GFR, Estimated 46 (L) >60 mL/min    Comment: (NOTE) Calculated using the CKD-EPI Creatinine Equation (2021)    Anion gap 6 5 - 15    Comment: Performed at [facility], [place] [address], [place], [place] [address]  Glucose, capillary      Status: Abnormal   Collection Time: 07/02/22  7:37 AM  Result Value Ref Range   Glucose-Capillary 107 (H) 70 - 99 mg/dL    Comment: Glucose reference range applies only to samples taken after fasting for at least 8 hours.  Glucose, capillary     Status: Abnormal   Collection Time: 07/02/22 11:09 AM  Result Value Ref Range   Glucose-Capillary 112 (H) 70 - 99 mg/dL    Comment: Glucose reference range applies only to samples taken after fasting for at least 8 hours.  Brain natriuretic peptide     Status: Abnormal   Collection Time: 07/02/22 12:34 PM  Result Value Ref Range   B Natriuretic Peptide 1,065.1 (H) 0.0 - 100.0 pg/mL    Comment: Performed at [facility], [place] [address], [place], [place] [address]  Sedimentation rate     Status: Abnormal   Collection Time: 07/02/22 12:34 PM  Result Value Ref Range   Sed Rate 119 (H) 0 - 16 mm/hr    Comment: Performed at [facility], [place] [address], [place], [place] [address]  D-dimer, quantitative     Status: Abnormal   Collection Time: 07/02/22 12:34 PM  Result Value Ref Range   D-Dimer, Quant 6.96 (H) 0.00 - 0.50 ug/mL-FEU    Comment: (NOTE) At the manufacturer cut-off value of 0.5 g/mL FEU, this assay has a negative predictive value of 95-100%.This assay is intended for use in conjunction with a clinical pretest probability (PTP) assessment model to exclude pulmonary embolism (PE) and deep venous thrombosis (DVT) in outpatients suspected of PE or DVT. Results should be correlated with clinical presentation. Performed at [facility], [place] [address] [place], [place] [address]   C-reactive protein     Status: Abnormal   Collection Time: 07/02/22 12:34 PM  Result Value Ref Range   CRP 10.9 (H) <1.0 mg/dL    Comment: Performed at [facility], [place] [address], [place], [place] [address]  Procalcitonin - Baseline     Status: None   Collection Time: 07/02/22 12:34 PM  Result  Value Ref Range   Procalcitonin 0.14 ng/mL    Comment:        Interpretation: PCT (Procalcitonin) <= 0.5 ng/mL: Systemic infection (sepsis) is not likely. Local bacterial infection is possible. (NOTE)       Sepsis PCT Algorithm           Lower Respiratory Tract                                      Infection PCT Algorithm    ----------------------------     ----------------------------         PCT < 0.25 ng/mL                PCT < 0.10 ng/mL          Strongly encourage             Strongly discourage   discontinuation of antibiotics    initiation of antibiotics    ----------------------------     -----------------------------       PCT 0.25 - 0.50 ng/mL            PCT 0.10 - 0.25 ng/mL               OR       >80% decrease in PCT            Discourage initiation of                                            antibiotics      Encourage discontinuation           of antibiotics    ----------------------------     -----------------------------         PCT >= 0.50 ng/mL              PCT 0.26 - 0.50 ng/mL               AND        <80% decrease in PCT             Encourage initiation of                                             antibiotics       Encourage continuation           of antibiotics    ----------------------------     -----------------------------        PCT >= 0.50 ng/mL                  PCT > 0.50 ng/mL               AND         increase in PCT                  Strongly encourage  initiation of antibiotics    Strongly encourage escalation           of antibiotics                                     -----------------------------                                           PCT <= 0.25 ng/mL                                                 OR                                        > 80% decrease in PCT                                      Discontinue / Do not initiate                                             antibiotics  Performed at  [place] [address], [place], [place] [address]   SAR CoV2 Serology (COVID 19)AB(IGG)IA     Status: Abnormal   Collection Time: 07/02/22 12:34 PM  Result Value Ref Range   SARS-CoV-2 Ab, IgG Reactive (A) NON REACTIVE    Comment: HEALTH DEPARTMENT NOTIFIED (NOTE) Reactive for SARS-CoV-2 IgG Antibodies. SARS-CoV-2 IgG antibodies detected.    Results suggest recent or prior infection with SARS-CoV-2.  Correlation with epidemiologic risk factors and other clinical and laboratory findings is recommended.  Serologic results should not be  used to diagnose or exclude recent SARS-CoV-2 infection.  If acute infection is suspected, direct testing for SARS-CoV-2 is necessary.   Protective immunity cannot be inferred based on these results alone.   False positive results may occur due to past or present infection with other, non-SARS-CoV-2 coronavirus strains, such as coronavirus HKU1, NL63, OC43 or 229E.     The expected result is Non-Reactive.   Fact Sheet for Recipients:  LimitBuy.nl   Fact Sheet for Healthcare Providers:  WordAgents.no   Testing was performed using the [employer] Coulter SARS-CoV-2 IgG assay.  This test is not yet approved o r cleared by the [employer] and has been authorized by FDA under an Emergency Use Authorization (EUA).  This EUA will remain in effect (meaning this test can be used) for the duration of the COVID-19 declaration under Section 564(b)(1) of the Act, 21 U.S.C. section 360bbb-3(b)(1), unless the authorization is terminated or revoked sooner.  Performed at [facility], [place] [address] [place], [place] [address]   Phosphorus     Status: Abnormal   Collection Time: 07/02/22 12:34 PM  Result Value Ref Range   Phosphorus 2.3 (L) 2.5 - 4.6 mg/dL    Comment: Performed at [facility], [place]  [address] [place], [place] [address]  Magnesium      Status: None   Collection Time: 07/02/22 12:34 PM  Result Value Ref Range   Magnesium 2.1 1.7 - 2.4 mg/dL    Comment: Performed at [facility], [place] [address] [place], [place] [address]  Lactic acid, plasma     Status: Abnormal   Collection Time: 07/02/22 12:34 PM  Result Value Ref Range   Lactic Acid, Venous 2.1 (HH) 0.5 - 1.9 mmol/L    Comment: CRITICAL RESULT CALLED TO, READ BACK BY AND VERIFIED WITH PRESLY R. RN _0  ON 07/02/22 BY Benjaman Kindler C Performed at [place] [address], [place], [place] [address]   Glucose, capillary     Status: Abnormal   Collection Time: 07/02/22  4:36 PM  Result Value Ref Range   Glucose-Capillary 144 (H) 70 - 99 mg/dL    Comment: Glucose reference range applies only to samples taken after fasting for at least 8 hours.  Glucose, capillary     Status: Abnormal   Collection Time: 07/02/22  8:05 PM  Result Value Ref Range   Glucose-Capillary 205 (H) 70 - 99 mg/dL    Comment: Glucose reference range applies only to samples taken after fasting for at least 8 hours.  Magnesium     Status: None   Collection Time: 07/03/22  4:51 AM  Result Value Ref Range   Magnesium 2.3 1.7 - 2.4 mg/dL    Comment: Performed at [facility], [place] [address] [place], [place] [address]  Phosphorus     Status: None   Collection Time: 07/03/22  4:51 AM  Result Value Ref Range   Phosphorus 3.7 2.5 - 4.6 mg/dL    Comment: Performed at [facility], [place] [address] [place], [place] [address]  Basic metabolic panel     Status: Abnormal   Collection Time: 07/03/22  4:51 AM  Result Value Ref Range   Sodium 145 135 - 145 mmol/L   Potassium 3.6 3.5 - 5.1 mmol/L   Chloride 102 98 - 111 mmol/L   CO2 35 (H) 22 - 32 mmol/L   Glucose, Bld 115 (H) 70 - 99 mg/dL    Comment: Glucose reference range applies only to samples taken after fasting for at least 8 hours.   BUN 34 (H) 8 - 23 mg/dL    Creatinine, Ser 1.68 (H) 0.61 - 1.24 mg/dL   Calcium 8.0 (L) 8.9 - 10.3 mg/dL   GFR, Estimated 40 (L) >60 mL/min    Comment: (NOTE) Calculated using the CKD-EPI Creatinine Equation (2021)    Anion gap 8 5 - 15    Comment: Performed at [facility], [place] [address] [place], [place], [place] [address]  Troponin I (High Sensitivity)     Status: Abnormal   Collection Time: 07/03/22  4:51 AM  Result Value Ref Range   Troponin I (High Sensitivity) 126 (HH) <18 ng/L    Comment: CRITICAL VALUE NOTED. VALUE IS CONSISTENT WITH PREVIOUSLY REPORTED/CALLED VALUE (NOTE) Elevated high sensitivity troponin I (hsTnI) values and significant  changes across serial measurements may suggest ACS but many other  chronic and acute conditions are known to elevate hsTnI results.  Refer to the "Links" section for chest pain algorithms and additional  guidance. Performed at [facility], [place] [address], [place], [place] [address]   Glucose, capillary     Status: None   Collection Time: 07/03/22  5:15 AM  Result Value  Ref Range   Glucose-Capillary 94 70 - 99 mg/dL    Comment: Glucose reference range applies only to samples taken after fasting for at least 8 hours.  Glucose, capillary     Status: Abnormal   Collection Time: 07/03/22  7:38 AM  Result Value Ref Range   Glucose-Capillary 117 (H) 70 - 99 mg/dL    Comment: Glucose reference range applies only to samples taken after fasting for at least 8 hours.   *Note: Due to a large number of results and/or encounters for the requested time period, some results have not been displayed. A complete set of results can be found in Results Review.    DG CHEST PORT 1 VIEW  Result Date: 07/02/2022 CLINICAL DATA:  Respiratory failure, hypoxia EXAM: PORTABLE CHEST 1 VIEW COMPARISON:  Previous studies including the examination of 06/27/2022 FINDINGS: Transverse diameter of heart is increased. There is previous coronary bypass surgery. There  is homogeneous infiltrate in right upper lung field with no significant change. There are patchy alveolar densities in both lower lung fields. There is blunting of both lateral CP angles patient there is interval removal of endotracheal tube and and enteric tube. IMPRESSION: There are alveolar and interstitial infiltrates in right upper lobe and both lower lobes with no significant interval change. Findings suggest multifocal pneumonia. Small bilateral pleural effusions. Electronically Signed   By: Elmer Picker M.D.   On: 07/02/2022 11:49    ROS Blood pressure (!) 115/53, pulse 99, temperature 98.1 F (36.7 C), temperature source Oral, resp. rate (!) 21, height _0  (1.778 m), weight 70.4 kg, SpO2 100 %. Physical Exam HENT:     Head: Normocephalic.     Comments: He has a hoarse voice that is gravelly and harsh. It does not sound like vocal cord paralysis.  Eyes:     Pupils: Pupils are equal, round, and reactive to light.  Neurological:     Mental Status: He is alert.       Assessment/Plan: Hoarseness- he has hoarseness after Covid and intubation. Not unexpected and he is improving. I talked to patient and we decided not to proceed with scope yet and will reevaluate next week.   Melissa Montane 07/03/2022, 9:15 AM

## 2022-07-03 NOTE — Progress Notes (Addendum)
PROGRESS NOTE  Paul Sandoval.  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  He was found to be  positive for COVID,found to be in acute hypoxic respiratory failure secondary to CHF exacerbation, pneumonia and COPD exacerbation.  He developed increased respiratory distress during this hospitalization and required to be transferred to [facility] service.  CTA chest showed multifocal pneumonia concerning for aspiration pneumonia, obstructive endobronchial material within the right lower lobe bronchus, near occlusive material in the right middle lobe bronchus, dense consolidation of the right lower lobe and left lower lobe.  Was intubated by ICU on 11/1, now extubated and transferred back to [facility] service on 11/4. Hospital course remarkable  for persistent respiratory distress, cough increased oxygen requirement.PCCM following  Significant events:  10/23 Admitted to [facility] 10/25 Increased O2 need to heated high flow. PCCM consulted 10/26 attempted right thoracentesis, no fluid obtained 10/26 CT-PA > no PE.  Right lower lobe consolidation with mucous, obstructing material right lower lobe and right middle lobe bronchi.  Left lower lobe and lingular airspace disease.  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  Right basilar pleural 10.8 cm fluid collection 10/28 worse infiltrates on CXR  10/30 solumedrol added, lasix to 30m BID  11/1 cont on unasyn. Intubated, bronch at bedside w mucus plugging 11/2 bronch w cryo, debulking of abnormal soft tissue occluding part of trachea,  RML RLL 11/3 extubated    [facility]  Assessment & Plan:  Principal Problem:   Acute on chronic combined systolic and diastolic congestive heart failure (HCC) Active Problems:   HLD (hyperlipidemia)   Essential hypertension   GERD (gastroesophageal reflux disease)   CAD (coronary artery disease)   Thrombocytopenia (HCC)   Stage 3b chronic kidney disease (CKD) ([place]   Primary cancer of right upper lobe of lung (HCC)   Goals of care, counseling/discussion   Pleural effusion on right   Cardiomyopathy, ischemic   History of endovascular stent graft for abdominal aortic aneurysm (AAA)   Chronic obstructive pulmonary disease with acute exacerbation (HCC)   Acute respiratory failure with hypoxia (HCC)   Anemia of chronic kidney failure   COVID-19 virus infection   Aspiration pneumonia (HCC)   Urinary retention   Multifocal pneumonia   Hypotension due to drugs   Malnutrition of moderate degree   Acute on chronic respiratory failure with hypoxia and hypercapnia (HCC)   Acute on chronic hypoxic respiratory failure: Secondary to multifocal pneumonia, systolic CHF  exacerbation, lung cancer, COVID. Was intubated on 9/1, extubated on 11/3. Underwent bronchoscopy for right lower lobe obstruction, right middle lobe and tracheal partial obstruction due to abnormal soft tissue, status post debulking.  Has chronic right upper lobe fibrosis, chronic right pleural effusion.  History of VATS, talc pleurodesis. Pathology report from bronc on 11/2 showed yeast but no malignant cells.  Started on Diflucan.  Currently on Solu-Medrol, will continue to taper when appropriate.  Continue YCandiss Norse budesonide. Completed course of molnupiravir for COVID He follows with Dr. AElsworth Soho This morning he was on 12 L of high flow oxygen.We have  ordered hypertonic saline nebulization, chest physiotherapy.  PCCM also following.  Patient also has developed hoarseness,seen by ENT , Dr  Janace Hoard. We will continue to attempt to wean the oxygen  Acute on chronic systolic CHF: Takes Lasix 20 mg daily at home.  Elevated BNP.Currently on 60 mg IV BID.  Continue to monitor input/output, daily weights.  Continue Entresto, metoprolol. Echocardiogram on May 2023 showed EF of 44%, grade 1 diastolic dysfunction.  New echo pending. Patient also has elevated troponin most likely secondary to type II MI: Hypoxia, CHF.  Denies chest pain  History of coronary artery disease: No anginal symptoms.  Continue Entresto, metoprolol. s/p CABG in 1996 and PCI to dLM and SVG-D in 02/2014.Plavix held due to malena  CKD stage IIIb, urine retention: Continue Foley.  On Flomax.  We will give a voiding  trial when appropriate, patient does not want to do the Foley out now.  Currently kidney function at baseline.  Baseline creatinine around 1.8-2  Acute on chronic thrombocytopenia/chronic anemia/elevated D-dimer: Duplex negative on 10/26, 11/2.  Continue to monitor CBC.  Platelets low but stable Patient found to have black tarry stool today, FOBT at bedside positive.  GI consulted/message sent to Dr Michail Sermon.  Hemoglobin has remained stable in the range of 8 for last few days, continue Protonix twice daily  GERD: Continue protonix  Hyperlipidemia: On crestor  Debility/deconditioning: PT/OT consulted       Nutrition Problem: Moderate Malnutrition Etiology: chronic illness (COPD, CHF, and new fungating soft tissue mass)    DVT prophylaxis:Place and maintain sequential compression device Start: 07/03/22 0936 Place TED hose Start: 06/19/22 1130 SCDs Start: 06/06/2022 1016     Code Status: Full Code  Family Communication: Called and discussed with son on phone on 11/7.  Patient status:Inpatient  Patient is from :Home  Anticipated discharge YJ:EHUD  Estimated DC date:not sure, still on high flow oxygen   Consultants: PCCM  Procedures:Intubation,bronch  Antimicrobials:  Anti-infectives  (From admission, onward)    Start     Dose/Rate Route Frequency Ordered Stop   07/03/22 1000  fluconazole (DIFLUCAN) tablet 100 mg        100 mg Oral Daily 07/02/22 1342 07/09/22 0959   07/02/22 1500  fluconazole (DIFLUCAN) tablet 200 mg        200 mg Oral  Once 07/02/22 1342 07/02/22 1422   06/23/22 0600  Ampicillin-Sulbactam (UNASYN) 3 g in sodium chloride 0.9 % 100 mL IVPB  Status:  Discontinued        3 g 200 mL/hr over 30 Minutes Intravenous Every 6 hours 06/22/22 1739 07/02/22 1344   06/19/22 2200  molnupiravir EUA (LAGEVRIO) capsule 800 mg        4 capsule Oral 2 times daily 06/19/22 1514 06/24/22 2159   06/19/22 1000  azithromycin (ZITHROMAX) 500 mg in sodium chloride 0.9 % 250 mL IVPB  Status:  Discontinued        500 mg 250 mL/hr over 60 Minutes Intravenous Every 24 hours 05/30/2022 1931 06/22/22 1711   06/19/22 1000  cefTRIAXone (ROCEPHIN) 1 g in sodium chloride 0.9 % 100 mL IVPB        1 g 200 mL/hr over 30 Minutes Intravenous Every 24 hours 06/16/2022 2120 06/22/22 1026   06/14/2022 2200  nirmatrelvir/ritonavir EUA (renal dosing) (PAXLOVID) 2 tablet  Status:  Discontinued       Note to Pharmacy: Please adjust or change treatment as needed.   2 tablet Oral 2 times daily 06/21/2022 1929 06/19/22 1514   05/27/2022 2030  cefTRIAXone (ROCEPHIN)  1 g in sodium chloride 0.9 % 100 mL IVPB  Status:  Discontinued        1 g 200 mL/hr over 30 Minutes Intravenous Every 24 hours 05/27/2022 1931 06/04/2022 2120   06/22/2022 1000  levofloxacin (LEVAQUIN) tablet 500 mg  Status:  Discontinued        500 mg Oral Daily 06/21/2022 0943 05/30/2022 1943       Subjective:  Patient seen and examined at the bedside today.  He was on a potty.  He was on 12 to 15 L of high flow oxygen.  Denies any worsening shortness of breath or cough.  Actually feels better than yesterday.  Noted to have black stool during my evaluation.  Denies any abdominal pain.  Objective: Vitals:   07/03/22 0401 07/03/22 0500 07/03/22 0510  07/03/22 1138  BP:   (!) 115/53   Pulse:   99   Resp:   (!) 21   Temp:  98.1 F (36.7 C)    TempSrc:  Oral    SpO2: 93%  100% 100%  Weight:  70.4 kg    Height:        Intake/Output Summary (Last 24 hours) at 07/03/2022 1230 Last data filed at 07/03/2022 0500 Gross per 24 hour  Intake --  Output 1100 ml  Net -1100 ml   Filed Weights   07/01/22 0500 07/02/22 0427 07/03/22 0500  Weight: 70.8 kg 68.8 kg 70.4 kg    Examination:  General exam: Overall comfortable, pleasant elderly gentleman, weak, deconditioned HEENT: PERRL Respiratory system: Scattered rhonchi, crackles Cardiovascular system: S1 & S2 heard, RRR.  Gastrointestinal system: Abdomen is nondistended, soft and nontender. Central nervous system: Alert and oriented Extremities: No edema, no clubbing ,no cyanosis Skin: No rashes, no ulcers,no icterus   GU: Foley   Data Reviewed: I have personally reviewed following labs and imaging studies  CBC: Recent Labs  Lab 06/30/2022 0501 06/29/22 0258 06/30/22 0913 07/01/22 0347 07/03/22 1011  WBC 11.0* 5.9 7.7 6.8 10.5  HGB 9.8* 8.8* 8.6* 8.6* 8.8*  HCT 33.4* 28.6* 29.6* 29.5* 31.0*  MCV 88.1 87.5 90.2 89.4 93.1  PLT 65* 59* 65* 68* 78*   Basic Metabolic Panel: Recent Labs  Lab 06/27/22 0243 06/27/22 0249 06/27/22 2322 07/16/2022 0127 06/27/2022 0501 06/29/22 0258 06/30/22 0913 07/01/22 0347 07/02/22 0655 07/02/22 1234 07/03/22 0451  NA 143  --   --    < > 142 143 142 143 142  --  145  K 4.1  --   --    < > 3.4* 3.7 3.5 3.5 3.4*  --  3.6  CL 96*  --   --    < > 92* 95* 98 100 97*  --  102  CO2 38*  --   --    < > 37* 38* 35* 35* 39*  --  35*  GLUCOSE 110*  --   --    < > 123* 137* 121* 93 135*  --  115*  BUN 42*  --   --    < > 40* 41* 40* 39* 27*  --  34*  CREATININE 1.70*  --   --    < > 1.56* 1.64* 1.75* 1.65* 1.51*  --  1.68*  CALCIUM 7.9*  --   --    < > 8.0* 7.8* 7.9* 7.8* 7.8*  --  8.0*  MG  --    < > 2.0  --  2.3 2.3  --   --   --  2.1 2.3  PHOS  2.8  --   --   --   --  3.7  --   --   --  2.3* 3.7   < > = values in this interval not displayed.     Recent Results (from the past 240 hour(s))  Culture, Respiratory w Gram Stain     Status: None   Collection Time: 06/27/22  1:26 PM   Specimen: Bronchoalveolar Lavage; Respiratory  Result Value Ref Range Status   Specimen Description   Final    BRONCHIAL ALVEOLAR LAVAGE Performed at [facility] [address], [place], [place] [address]    Special Requests   Final    NONE Performed at [facility], [place] [address], [place], [place] [address]    Gram Stain   Final    NO WBC SEEN RARE BUDDING YEAST SEEN Performed at [facility], [place] [address], [place], [place] [address]    Culture FEW CANDIDA ALBICANS  Final   Report Status 06/30/2022 FINAL  Final     Radiology Studies: DG CHEST PORT 1 VIEW  Result Date: 07/02/2022 CLINICAL DATA:  Respiratory failure, hypoxia EXAM: PORTABLE CHEST 1 VIEW COMPARISON:  Previous studies including the examination of 06/27/2022 FINDINGS: Transverse diameter of heart is increased. There is previous coronary bypass surgery. There is homogeneous infiltrate in right upper lung field with no significant change. There are patchy alveolar densities in both lower lung fields. There is blunting of both lateral CP angles patient there is interval removal of endotracheal tube and and enteric tube. IMPRESSION: There are alveolar and interstitial infiltrates in right upper lobe and both lower lobes with no significant interval change. Findings suggest multifocal pneumonia. Small bilateral pleural effusions. Electronically Signed   By: Elmer Picker M.D.   On: 07/02/2022 11:49    Scheduled Meds:  arformoterol  15 mcg Nebulization BID   budesonide (PULMICORT) nebulizer solution  0.25 mg Nebulization BID   Chlorhexidine Gluconate Cloth  6 each Topical Daily   docusate  100 mg Oral BID   famotidine  20 mg Oral Daily    fluconazole  100 mg Oral Daily   furosemide  60 mg Intravenous BID   guaiFENesin  15 mL Oral Q12H   methylPREDNISolone (SOLU-MEDROL) injection  60 mg Intravenous Daily   metoprolol tartrate  37.5 mg Oral BID   pantoprazole  40 mg Oral BID   polyethylene glycol  17 g Oral Daily   revefenacin  175 mcg Nebulization Daily   rosuvastatin  20 mg Oral QHS   sacubitril-valsartan  1 tablet Oral BID   Continuous Infusions:  sodium chloride Stopped (07/23/2022 1222)     LOS: 14 days   Shelly Coss, MD [facility] P11/02/2022, 12:30 PM

## 2022-07-03 NOTE — Progress Notes (Signed)
Attempted x 2 to give patient nebulizer treatments & do CPT. Both times patient was unavailable due to eating breakfast on first attempt & personal care on the second attempt.

## 2022-07-03 NOTE — Progress Notes (Signed)
  Echocardiogram 2D Echocardiogram has been performed.  Lana Fish 07/03/2022, 3:04 PM

## 2022-07-03 NOTE — Progress Notes (Signed)
Patient refuses to wear BIPAP at this time. He shook his head no when asked if he would wear BIPAP. Patient's wife states that he cannot tolerate BIPAP. BIPAP machine is in the patient's room on standby if needed.

## 2022-07-03 NOTE — Consult Note (Addendum)
Referring Provider: Dr. Tawanna Solo Primary Care Physician:  Coral Spikes, DO Primary Gastroenterologist:  Althia Forts  Reason for Consultation:  Anemia; Melena  HPI: Paul Sandoval. is a 81 y.o. male with acute on chronic hypoxic respiratory failures with CHF exacerbation, pneumonia, COPD exacerbation, COVID-19 infection and history of lung cancer. Patient extubated on 11/4 and is sitting in a bedside chair on 12 L HFNC. Patient speaks in a very raspy voice stating he is hoarse. Had a black stool today that the hospitalist saw and other stools have been brown. He denies N/V/abdominal pain/GERD/hematochezia. He thinks his black stool was because of the grape juice he says he has been on for the past 10 days. Hgb 8.8 (8.6). Wife in the room.  Past Medical History:  Diagnosis Date   AAA (abdominal aortic aneurysm) ([place]) 2010   4.4 cm 08/2008;4.44 in 7/10 and 4.65 in 08/2009; 4.8 by CT in 11/2009; 4.3 by ultrasound in 08/2010   Anemia    Arteriosclerotic cardiovascular disease (ASCVD) 1996   CABG-1996   Arthritis    "fingers" (03/18/2014)   CAD (coronary artery disease)    03/18/14:  PCI with DES to distal left main. 7/29: DES to the SVG to Diag   [facility])    Upper right lobe lung cancer   Cardiomyopathy, ischemic    Echo 03/17/14: EF 45-50%   CHF (congestive heart failure) (HCC)    Chronic bronchitis (HCC)    Chronic kidney disease    CRF   Chronic rhinitis    Colonic polyp 2002   polypectomy in 2002   COPD (chronic obstructive pulmonary disease) ([place])    Diverticulosis    Dyspnea    with exertion   ED (erectile dysfunction)    Encounter for antineoplastic chemotherapy 12/19/2015   GERD (gastroesophageal reflux disease)    History of blood transfusion    Hyperlipidemia    Hypertension    IFG (impaired fasting glucose)    Myocardial infarction [facility])    "told h/o silent MI sometime before 1996"   Pneumonia ~ 2001; ~ 2005    has had more than twice   Right bundle branch block     Tobacco abuse, in remission    40 pack year total consumption; discontinued in 1996    Past Surgical History:  Procedure Laterality Date   ABDOMINAL AORTIC ANEURYSM REPAIR  11/2012   ABDOMINAL AORTIC ENDOVASCULAR STENT GRAFT N/A 12/11/2012   Procedure: ABDOMINAL AORTIC ENDOVASCULAR STENT GRAFT;  Surgeon: Mal Misty, MD;  Location: [place];  Service: Vascular;  Laterality: N/A;  Ultrasound guided; [place]  07/12/2022   Procedure: BRONCHIAL WASHINGS;  Surgeon: Candee Furbish, MD;  Location: [facility] ENDOSCOPY;  Service: Endoscopy;;   CARDIAC CATHETERIZATION  01/08/1995   COLONOSCOPY  2002   polypectomy-patient denies   CORONARY ANGIOPLASTY WITH STENT PLACEMENT  03/18/2014   "1"   CORONARY ANGIOPLASTY WITH STENT PLACEMENT  03/24/2014   "1"   CORONARY ARTERY BYPASS GRAFT  01/09/1995   "CABG X3"   CRYOTHERAPY  07/04/2022   Procedure: CRYOTHERAPY;  Surgeon: Candee Furbish, MD;  Location: [facility];  Service: Endoscopy;;   FLEXIBLE BRONCHOSCOPY N/A 03/01/2017   Procedure: FLEXIBLE BRONCHOSCOPY WITH BIOPSIES;  Surgeon: Gaye Pollack, MD;  Location: [facility] OR;  Service: Thoracic;  Laterality: N/A;   JOINT REPLACEMENT     LAPAROSCOPIC CHOLECYSTECTOMY  12/2009   LEFT AND RIGHT HEART CATHETERIZATION WITH CORONARY/GRAFT ANGIOGRAM N/A 03/18/2014   Procedure: LEFT AND  RIGHT HEART CATHETERIZATION WITH Beatrix Fetters;  Surgeon: Blane Ohara, MD;  Location[facility] C[facility] LAB;  Service: Cardiovascular;  Laterality: N/A;   PERCUTANEOUS CORONARY STENT INTERVENTION (PCI-S)  03/18/2014   Procedure: PERCUTANEOUS CORONARY STENT INTERVENTION (PCI-S);  Surgeon: Blane Ohara, MD;  Location: [facility] CATH LAB;  Service: Cardiovascular;;   PERCUTANEOUS CORONARY STENT INTERVENTION (PCI-S) N/A 03/24/2014   Procedure: PERCUTANEOUS CORONARY STENT INTERVENTION (PCI-S);  Surgeon: Blane Ohara, MD;  Location: [facility] CATH LAB;  Service: Cardiovascular;  Laterality: N/A;   PLEURAL EFFUSION DRAINAGE Right  03/01/2017   Procedure: DRAINAGE OF PLEURAL EFFUSION;  Surgeon: Gaye Pollack, MD;  Location: [place] OR;  Service: Thoracic;  Laterality: Right;   RIGHT/LEFT HEART CATH AND CORONARY/GRAFT ANGIOGRAPHY N/A 05/15/2017   Procedure: RIGHT/LEFT HEART CATH AND CORONARY/GRAFT ANGIOGRAPHY;  Surgeon: Larey Dresser, MD;  Location: [place] CV LAB;  Service: Cardiovascular;  Laterality: N/A;   TALC PLEURODESIS Right 03/01/2017   Procedure: Pietro Cassis;  Surgeon: Gaye Pollack, MD;  Location: [place];  Service: Thoracic;  Laterality: Right;   TOTAL HIP ARTHROPLASTY Left 01/21/2013   Procedure: TOTAL HIP ARTHROPLASTY ANTERIOR APPROACH;  Surgeon: Mauri Pole, MD;  Location: [place];  Service: Orthopedics;  Laterality: Left;   VIDEO ASSISTED THORACOSCOPY Right 03/01/2017   Procedure: VIDEO ASSISTED THORACOSCOPY WITH BIOPSIES;  Surgeon: Gaye Pollack, MD;  Location: [place];  Service: Thoracic;  Laterality: Right;   VIDEO BRONCHOSCOPY N/A 11/17/2015   Procedure: VIDEO BRONCHOSCOPY WITH FLUORO;  Surgeon: Rigoberto Noel, MD;  Location: [place];  Service: Cardiopulmonary;  Laterality: N/A;   VIDEO BRONCHOSCOPY Bilateral 07/08/2022   Procedure: VIDEO BRONCHOSCOPY WITHOUT FLUORO;  Surgeon: Candee Furbish, MD;  [facility];  Service: Endoscopy;  Laterality: Bilateral;  Bedside    Prior to Admission medications   Medication Sig Start Date End Date Taking? Authorizing Provider  acetaminophen (TYLENOL) 500 MG tablet Take 1,000 mg by mouth as needed for fever.   Yes [provider]  albuterol (VENTOLIN HFA) 108 (90 Base) MCG/ACT inhaler Inhale 2 puffs into the lungs every 6 (six) hours as needed for wheezing or shortness of breath. Patient taking differently: Inhale into the lungs in the morning, at noon, and at bedtime. 01/01/22  Yes Cook, Jayce G, DO  carvedilol (COREG) 12.5 MG tablet TAKE 1 &1/2 TABLET BY MOUTH TWICE DAILY WITH A MEAL Patient taking differently: Take 18.75 mg by mouth 2 (two)  times daily with a meal. 02/12/22  Yes Larey Dresser, MD  clopidogrel (PLAVIX) 75 MG tablet TAKE (1) TABLET BY MOUTH ONCE DAILY. Patient taking differently: Take 75 mg by mouth daily. 01/23/22  Yes Larey Dresser, MD  dapagliflozin propanediol (FARXIGA) 10 MG TABS tablet Take 10 mg by mouth daily before breakfast. 08/27/19  Yes Larey Dresser, MD  Dextromethorphan-guaiFENesin (ROBITUSSIN DM PO) Take 20 mLs by mouth as needed (for cough).   Yes [provider]  eplerenone (INSPRA) 50 MG tablet Take 1 tablet (50 mg total) by mouth every evening. 04/05/20  Yes Larey Dresser, MD  fluticasone-salmeterol [facility] INHUB) 100-50 MCG/ACT AEPB Inhale 1 puff into the lungs 2 (two) times daily. 12/11/21  Yes Rigoberto Noel, MD  furosemide (LASIX) 20 MG tablet Take 20 mg by mouth daily.   Yes [provider]  ipratropium-albuterol (DUONEB) 0.5-2.5 (3) MG/3ML SOLN Take 3 mLs by nebulization in the morning, at noon, and at bedtime. 11/10/21  Yes Rigoberto Noel, MD  nitroGLYCERIN (NITROSTAT)  0.4 MG SL tablet PLACE 1 TAB UNDER TONGUE EVERY 5 MIN IF NEEDED FOR CHEST PAIN. MAY USE 3 TIMES.NO RELIEF CALL 911. Patient taking differently: Place 0.4 mg under the tongue every 5 (five) minutes as needed for chest pain. 01/01/22  Yes Cook, Jayce G, DO  Olopatadine HCl (PATADAY OP) Place 1 drop into both eyes as needed (dry eye).   Yes [provider]  omeprazole (PRILOSEC) 20 MG capsule Take 1 capsule (20 mg total) by mouth daily. 03/14/22  Yes Cook, Jayce G, DO  rosuvastatin (CRESTOR) 20 MG tablet TAKE ONE TABLET BY MOUTH ONCE DAILY. Patient taking differently: Take 20 mg by mouth at bedtime. 03/20/22  Yes Larey Dresser, MD  sacubitril-valsartan (ENTRESTO) 97-103 MG Take 0.5 tablets by mouth 2 (two) times daily. 07/13/20  Yes [provider]  sodium chloride (OCEAN) 0.65 % SOLN nasal spray Place 2 sprays into both nostrils as needed for congestion.   Yes [provider]     Scheduled Meds:  arformoterol  15 mcg Nebulization BID   budesonide (PULMICORT) nebulizer solution  0.25 mg Nebulization BID   Chlorhexidine Gluconate Cloth  6 each Topical Daily   docusate  100 mg Oral BID   fluconazole  100 mg Oral Daily   furosemide  60 mg Intravenous BID   Gerhardt's butt cream   Topical TID   guaiFENesin  15 mL Oral Q12H   methylPREDNISolone (SOLU-MEDROL) injection  60 mg Intravenous Daily   metoprolol tartrate  37.5 mg Oral BID   pantoprazole (PROTONIX) IV  40 mg Intravenous Q12H   polyethylene glycol  17 g Oral Daily   revefenacin  175 mcg Nebulization Daily   rosuvastatin  20 mg Oral QHS   sacubitril-valsartan  1 tablet Oral BID   Continuous Infusions:  sodium chloride Stopped (07/16/2022 1222)   PRN Meds:.acetaminophen **OR** acetaminophen, baclofen, levalbuterol **AND** ipratropium, menthol-cetylpyridinium, ondansetron **OR** ondansetron (ZOFRAN) IV, mouth rinse, phenol, senna-docusate, sodium chloride, traZODone  Allergies as of 06/17/2022 - Review Complete 06/26/2022  Allergen Reaction Noted   Neomycin Hives, Itching, and Rash 01/20/2013   Anoro ellipta [umeclidinium-vilanterol] Itching and Rash 12/05/2021   Cetirizine & related Rash 12/02/2020   Stiolto respimat [tiotropium bromide-olodaterol] Itching and Rash 12/05/2021    Family History  Problem Relation Age of Onset   Heart disease Father    Cancer Father        Lung   Arthritis Mother    Parkinsonism Mother    Arthritis Sister        Brother with rheumatoid arthritis   Hypertension Brother    Colon cancer Neg Hx    Colon polyps Neg Hx     Social History   Socioeconomic History   Marital status: Married    Spouse name: Ann   Number of children: 1   Years of education: Not on file   Highest education level: Not on file  Occupational History   Occupation: Retired    Comment: [employer]  Tobacco Use   Smoking status: Former    Packs/day: 1.50    Years: 30.00     Total pack years: 45.00    Types: Cigarettes    Start date: 12/01/1956    Quit date: 01/08/1995    Years since quitting: 27.5   Smokeless tobacco: Never  Vaping Use   Vaping Use: Never used  Substance and Sexual Activity   Alcohol use: No    Alcohol/week: 0.0 standard drinks of alcohol    Comment: 03/18/2014 "  no alacohol since 1996"   Drug use: No   Sexual activity: Never  Other Topics Concern   Not on file  Social History Narrative   Married since 09/18/1964.    1 son Legrand [place]. No grandchildren.   Social Determinants of Health   Financial Resource Strain: Low Risk  (08/08/2021)   Overall Financial Resource Strain (CARDIA)    Difficulty of Paying Living Expenses: Not hard at all  Food Insecurity: No Food Insecurity (06/26/2022)   Hunger Vital Sign    Worried About Running Out of Food in the Last Year: Never true    Ran Out of Food in the Last Year: Never true  Transportation Needs: No Transportation Needs (06/23/2022)   PRAPARE - Hydrologist (Medical): No    Lack of Transportation (Non-Medical): No  Physical Activity: Insufficiently Active (08/08/2021)   Exercise Vital Sign    Days of Exercise per Week: 3 days    Minutes of Exercise per Session: 20 min  Stress: No Stress Concern Present (08/08/2021)   [place]    Feeling of Stress : Not at all  Social Connections: [place] (08/08/2021)   Social Connection and Isolation Panel [NHANES]    Frequency of Communication with Friends and Family: More than three times a week    Frequency of Social Gatherings with Friends and Family: Three times a week    Attends Religious Services: More than 4 times per year    Active Member of Clubs or Organizations: Yes    Attends Archivist Meetings: More than 4 times per year    Marital Status: Married  Human resources officer Violence: Not At Risk (06/24/2022)   Humiliation, Afraid,  Rape, and Kick questionnaire    Fear of Current or Ex-Partner: No    Emotionally Abused: No    Physically Abused: No    Sexually Abused: No    Review of Systems: All negative except as stated above in HPI.  Physical Exam: Vital signs: Vitals:   07/03/22 1138 07/03/22 1236  BP:  (!) 107/56  Pulse:  98  Resp:  (!) 24  Temp:  98.6 F (37 C)  SpO2: 100% 100%   Last BM Date : 07/02/22 General:   Lethargic, elderly, mild acute distress, thin  Head: normocephalic, atraumatic Eyes: anicteric sclera ENT: oropharynx clear Neck: supple, nontender Lungs:  Coarse with crackles. Mild acute distress. Heart:  Regular rate and rhythm; no murmurs, clicks, rubs,  or gallops. Abdomen: soft, nontender, nondistended, +BS  Rectal:  Deferred Ext: no edema  GI:  Lab Results: Recent Labs    07/01/22 0347 07/03/22 1011  WBC 6.8 10.5  HGB 8.6* 8.8*  HCT 29.5* 31.0*  PLT 68* 78*   BMET Recent Labs    07/01/22 0347 07/02/22 0655 07/03/22 0451  NA 143 142 145  K 3.5 3.4* 3.6  CL 100 97* 102  CO2 35* 39* 35*  GLUCOSE 93 135* 115*  BUN 39* 27* 34*  CREATININE 1.65* 1.51* 1.68*  CALCIUM 7.8* 7.8* 8.0*   LFT No results for input(s): "PROT", "ALBUMIN", "AST", "ALT", "ALKPHOS", "BILITOT", "BILIDIR", "IBILI" in the last 72 hours. PT/INR No results for input(s): "LABPROT", "INR" in the last 72 hours.   Studies/Results: DG CHEST PORT 1 VIEW  Result Date: 07/02/2022 CLINICAL DATA:  Respiratory failure, hypoxia EXAM: PORTABLE CHEST 1 VIEW COMPARISON:  Previous studies including the examination of 06/27/2022 FINDINGS: Transverse diameter of heart is increased.  There is previous coronary bypass surgery. There is homogeneous infiltrate in right upper lung field with no significant change. There are patchy alveolar densities in both lower lung fields. There is blunting of both lateral CP angles patient there is interval removal of endotracheal tube and and enteric tube. IMPRESSION: There are  alveolar and interstitial infiltrates in right upper lobe and both lower lobes with no significant interval change. Findings suggest multifocal pneumonia. Small bilateral pleural effusions. Electronically Signed   By: Elmer Picker M.D.   On: 07/02/2022 11:49    Impression/Plan: Black stool today with chronic anemia in the setting of CHF exacerbation with acute on chronic hypoxic respiratory failure. No signs of active GI bleeding. Hgb stable at 8.8. In setting of current respiratory status would not recommend an endoscopic procedure unless black stools persist with significant decrease in Hgb. Manage conservatively with IV PPI Q 12 hours. Diet recs per primary team. [facility] will sign off. Call us back if questions.    LOS: 14 days   Lear Ng  07/03/2022, 2:27 PM  Questions please call [phone]

## 2022-07-03 NOTE — Progress Notes (Signed)
Placed PT on bipap around 2300 per RN PT wore Bipap about 45 min and said he couldn't take any more. PT stable on 15L

## 2022-07-03 NOTE — Progress Notes (Signed)
Patient is adamant that after several attempts he is unable to tolerate BiPAP.  Patient to wear his nasal cannula while sleeping tonight.

## 2022-07-04 ENCOUNTER — Ambulatory Visit: Payer: Self-pay | Admitting: Family Medicine

## 2022-07-04 DIAGNOSIS — I5043 Acute on chronic combined systolic (congestive) and diastolic (congestive) heart failure: Secondary | ICD-10-CM | POA: Diagnosis not present

## 2022-07-04 DIAGNOSIS — U071 COVID-19: Secondary | ICD-10-CM | POA: Diagnosis not present

## 2022-07-04 DIAGNOSIS — J9621 Acute and chronic respiratory failure with hypoxia: Secondary | ICD-10-CM | POA: Diagnosis not present

## 2022-07-04 DIAGNOSIS — J189 Pneumonia, unspecified organism: Secondary | ICD-10-CM | POA: Diagnosis not present

## 2022-07-04 DIAGNOSIS — R339 Retention of urine, unspecified: Secondary | ICD-10-CM

## 2022-07-04 LAB — COMPREHENSIVE METABOLIC PANEL
ALT: 28 U/L (ref 0–44)
AST: 23 U/L (ref 15–41)
Albumin: 1.9 g/dL — ABNORMAL LOW (ref 3.5–5.0)
Alkaline Phosphatase: 86 U/L (ref 38–126)
Anion gap: 11 (ref 5–15)
BUN: 30 mg/dL — ABNORMAL HIGH (ref 8–23)
CO2: 38 mmol/L — ABNORMAL HIGH (ref 22–32)
Calcium: 8 mg/dL — ABNORMAL LOW (ref 8.9–10.3)
Chloride: 94 mmol/L — ABNORMAL LOW (ref 98–111)
Creatinine, Ser: 1.77 mg/dL — ABNORMAL HIGH (ref 0.61–1.24)
GFR, Estimated: 38 mL/min — ABNORMAL LOW (ref >60)
Glucose, Bld: 104 mg/dL — ABNORMAL HIGH (ref 70–99)
Potassium: 3.2 mmol/L — ABNORMAL LOW (ref 3.5–5.1)
Sodium: 143 mmol/L (ref 135–145)
Total Bilirubin: 0.7 mg/dL (ref 0.3–1.2)
Total Protein: 5.5 g/dL — ABNORMAL LOW (ref 6.5–8.1)

## 2022-07-04 LAB — QUANTIFERON-TB GOLD PLUS (RQFGPL)
QuantiFERON Mitogen Value: 0.04 IU/mL
QuantiFERON Nil Value: 0 IU/mL
QuantiFERON TB1 Ag Value: 0 IU/mL
QuantiFERON TB2 Ag Value: 0 IU/mL

## 2022-07-04 LAB — GLUCOSE, CAPILLARY
Glucose-Capillary: 86 mg/dL (ref 70–99)
Glucose-Capillary: 86 mg/dL (ref 70–99)
Glucose-Capillary: 137 mg/dL — ABNORMAL HIGH (ref 70–99)
Glucose-Capillary: 312 mg/dL — ABNORMAL HIGH (ref 70–99)
Glucose-Capillary: 338 mg/dL — ABNORMAL HIGH (ref 70–99)
Glucose-Capillary: 122 mg/dL — ABNORMAL HIGH (ref 70–99)

## 2022-07-04 LAB — CBC
HCT: 29.3 % — ABNORMAL LOW (ref 39.0–52.0)
Hemoglobin: 8.5 g/dL — ABNORMAL LOW (ref 13.0–17.0)
MCH: 26.2 pg (ref 26.0–34.0)
MCHC: 29 g/dL — ABNORMAL LOW (ref 30.0–36.0)
MCV: 90.4 fL (ref 80.0–100.0)
Platelets: 77 10*3/uL — ABNORMAL LOW (ref 150–400)
RBC: 3.24 MIL/uL — ABNORMAL LOW (ref 4.22–5.81)
RDW: 16.7 % — ABNORMAL HIGH (ref 11.5–15.5)
WBC: 8.6 10*3/uL (ref 4.0–10.5)
nRBC: 0 % (ref 0.0–0.2)

## 2022-07-04 LAB — QUANTIFERON-TB GOLD PLUS: QuantiFERON-TB Gold Plus: UNDETERMINED — AB

## 2022-07-04 LAB — PHOSPHORUS: Phosphorus: 2.4 mg/dL — ABNORMAL LOW (ref 2.5–4.6)

## 2022-07-04 LAB — MAGNESIUM: Magnesium: 2.2 mg/dL (ref 1.7–2.4)

## 2022-07-04 MED ORDER — INSULIN ASPART 100 UNIT/ML IJ SOLN
0.0000 [IU] | INTRAMUSCULAR | Status: DC
  Administered 2022-07-04: 7 [IU] via SUBCUTANEOUS
  Administered 2022-07-04: 1 [IU] via SUBCUTANEOUS
  Administered 2022-07-05: 3 [IU] via SUBCUTANEOUS
  Administered 2022-07-05: 2 [IU] via SUBCUTANEOUS
  Administered 2022-07-05 – 2022-07-06 (×3): 1 [IU] via SUBCUTANEOUS

## 2022-07-04 MED ORDER — ENSURE ENLIVE PO LIQD
237.0000 mL | Freq: Three times a day (TID) | ORAL | Status: DC
  Administered 2022-07-04 – 2022-07-06 (×2): 237 mL via ORAL

## 2022-07-04 MED ORDER — POTASSIUM CHLORIDE CRYS ER 20 MEQ PO TBCR
40.0000 meq | EXTENDED_RELEASE_TABLET | Freq: Once | ORAL | Status: AC
  Administered 2022-07-04: 40 meq via ORAL
  Filled 2022-07-04: qty 2

## 2022-07-04 MED ORDER — NYSTATIN 100000 UNIT/ML MT SUSP
5.0000 mL | Freq: Four times a day (QID) | OROMUCOSAL | Status: DC
  Administered 2022-07-04 – 2022-07-07 (×9): 500000 [IU] via ORAL
  Filled 2022-07-04 (×10): qty 5

## 2022-07-04 MED ORDER — MAGIC MOUTHWASH
10.0000 mL | Freq: Three times a day (TID) | ORAL | Status: DC | PRN
  Administered 2022-07-04: 10 mL via ORAL
  Filled 2022-07-04 (×2): qty 10

## 2022-07-04 NOTE — Progress Notes (Signed)
Notified on call provider that patient had 3 beats of V-Tach. Patient okay when checked on patient. On call provider was also notified that patient had been having more frequent PVCs. On call provider instructed RN to wait on labs to see what patient's results were for the potassium and magnesium. Potassium was 3.2. Notified on call provider and on call provider put in an order for one time dose of po 40 mEq tablet.

## 2022-07-04 NOTE — Progress Notes (Signed)
Patient refused chest vest bc he had just eaten

## 2022-07-04 NOTE — Progress Notes (Signed)
PT Cancellation Note  Patient Details [name]. MRN: [id number] DOB: [birth year]   Cancelled Treatment:    Reason Eval/Treat Not Completed: Patient declined, no reason specified, offered  to work on leg exercises, standing, any activity from recliner. Patient declined.  [place] Office [phone] Weekend pager-[phone]    Claretha Cooper 07/04/2022, 3:09 PM

## 2022-07-04 NOTE — Progress Notes (Signed)
OT Cancellation Note  Patient Details [name]. MRN: [id number] DOB: July 29, 1941   Cancelled Treatment:    Reason Eval/Treat Not Completed: Patient declined, no reason specified. Will attempt another day.   Leota Sauers, OTR/L  07/04/2022, 3:14 PM

## 2022-07-04 NOTE — Progress Notes (Signed)
Speech Language Pathology Treatment: Dysphagia  Patient Details [name]. MRN: [id number] DOB: Oct 15, 1940 Today's Date: 07/04/2022 Time: 8756-4332 SLP Time Calculation (min) (ACUTE ONLY): 17 min  Assessment / Plan / Recommendation Clinical Impression  Pt did not pass 3 ounce yale swallow screen as he coughed approx 25 seconds after intake - concerning for elevated silent aspiration risk. SLP took pictures of pt's tongue and oral cavity - appears with fungal lesions - which he reports lingual lesions occur since he has been using his inhalater. Pt avoids all cold or hot items - consumes only room temperature. SLP questions if his ongoing hoarseness may be due to his potential candidia in phayrnx/larynx/trachea/esophagus. Advised pt to consume small single sips of liquids and assure he is not dyspneic when consuming po intake using teach back and demonstration. RR up to 33 during session- increasing risk of imprecise reciprocity of swallow/respirations. Recommend MBS when pt able to swallow most comforably. Note magic mouthwash and nystain ordered - thank you. Will plan MBS at latest on Friday, 11/9 as SLP can assure barium is room temperature. Pt agreeable to plan -     HPI HPI: 81 year old M with PMH of combined CHF/ICM, CAD/CABG, COPD, CKD-3A, right lung cancer, HTN, osteoarthritis and anemia returning with shortness of breath, orthopnea and BLE edema and admitted for acute respiratory failure with hypoxia due to acute on chronic combined CHF, possible LLL pneumonia and COPD exacerbation. Tested positive for COVID-19 as well. However, he developed respiratory distress with increased oxygen requirement the night of 10/25.  PCCM consulted and he was transferred to SDU.  CTA chest negative for PE but multifocal pneumonia concerning for aspiration pneumonia obstructive endobronchial material within RLL bronchus, near occlusive material in RML bronchus, dense consolidation of the RLL and LLL,  patchy airspace disease in LUL and LLL and chronic right-sided pleural collection.    CXR 06/23/22 indicated Opacity in the left mid and lower lung is similar to slightly  worsened in the interval. The right lung remains poorly aerated,  similar to slightly worsened in the interval. Dense opacification of  the right apex and right base remains; ST consulted for BSE to assess swallow function.  Pt has since been intubated for biospy of his lung  biopsy showed 07/02/22 - Few canddai ain BAL from 07/23/2022. Afebrile. Normal WBC. On 9L HFNC. RN says easy desats when OOB to 80s and needs 15L [place]. Voice stil very hoarse and pt has been treated with Diflucan for Candida.  SLP follow up for dysphagia management especially given his intubation and hoarseness that he reports has not improved since extubation. Of note, evaluating SLP reported pt has some hoarseness.  Intake is poor due to pt's odynophagia.  Pt only consuming room temperature and not very cold foods due to his odynophagia.  He admits difficulty getting things to "go down".      SLP Plan  Continue with current plan of care;MBS      Recommendations for follow up therapy are one component of a multi-disciplinary discharge planning process, led by the attending physician.  Recommendations may be updated based on patient status, additional functional criteria and insurance authorization.    Recommendations  Diet recommendations: Other(comment) (as tolerated) Liquids provided via: Cup;Straw Medication Administration: Whole meds with liquid (1 at a time) Supervision: Patient able to self feed Compensations: Slow rate;Small sips/bites Postural Changes and/or Swallow Maneuvers: Seated upright 90 degrees;Upright 30-60 min after meal  Oral Care Recommendations: Oral care BID;Patient independent with oral care Follow Up Recommendations: Other (comment) (TBD) Assistance recommended at discharge: Other (comment) (TBD) SLP Visit Diagnosis:  Dysphagia, unspecified (R13.10) Plan: Continue with current plan of care;MBS           Kathleen Lime, MS [facility] SLP Acute Rehab Services Office [phone] Pager [phone]  Macario Golds  07/04/2022, 5:21 PM

## 2022-07-04 NOTE — Plan of Care (Signed)
  Problem: Education: Goal: Knowledge of General Education information will improve Description: Including pain rating scale, medication(s)/side effects and non-pharmacologic comfort measures Outcome: Progressing   Problem: Coping: Goal: Level of anxiety will decrease Outcome: Progressing   Problem: Pain Managment: Goal: General experience of comfort will improve Outcome: Progressing   Problem: Safety: Goal: Ability to remain free from injury will improve Outcome: Progressing   Problem: Skin Integrity: Goal: Risk for impaired skin integrity will decrease Outcome: Progressing   Problem: Coping: Goal: Psychosocial and spiritual needs will be supported Outcome: Progressing   Problem: Respiratory: Goal: Will maintain a patent airway Outcome: Progressing   Problem: Safety: Goal: Non-violent Restraint(s) Outcome: Completed/Met

## 2022-07-04 NOTE — Progress Notes (Addendum)
The patient is out of bed on bedside commode, the patient complained of worsening SOB, oxygen desaturation 82% on 15 lt HF [place]. After sitting for one minute oxygen saturation improved 88-89% HF [place].Pt stated SOB is improving. The patient also complains of oral pharyngeal irritation (burning) with foods and liquids. Will contact provider to assist.

## 2022-07-04 NOTE — Progress Notes (Signed)
Nutrition Follow-up  DOCUMENTATION CODES:  Non-severe (moderate) malnutrition in context of chronic illness  INTERVENTION:  -Liberalize diet to regular -Provide Ensure Plus HP TID (350kcal, 20g protein) until appetite and po intake improve -Provide chloraseptic spray for sore throat/painful swallow -Consider checking A1c for elevated BG and managing with SSI as appropriate.  NUTRITION DIAGNOSIS:  Moderate Malnutrition related to chronic illness (COPD, CHF, and new fungating soft tissue mass) as evidenced by percent weight loss, moderate fat depletion, moderate muscle depletion. ongoing  GOAL:  Patient will meet greater than or equal to 90% of their needs  MONITOR:  PO intake, Diet advancement, Vent status, TF tolerance  REASON FOR ASSESSMENT:  F/u  ASSESSMENT:  Pt is an 81yo M with PMH of combined CHF/ICM, CAD/CABG, COPD, CKD-3A, right lung cancer, HTN, osteoarthritis, anemia and recent hospitalization for COPD and CHF exacerbation returning with SOB, orthopnea and BLE edema and admitted for acute respiratory failure with hypoxia due to acute on chronic combined CHF,  pneumonia and COPD exacerbation.  Pt extubated on 11/3.  Visited pt at bedside with wife present. Pt reports poor po intake due to painful swallow/sore throat. He is taking in liquids but has not been doing well with solid foods. Discussed the importance of adequate nutrition for healing and recovery from [place]. Pt agreeable to Ensure Plus HP vanilla. Recommend ONS TID to provide 350kcal, and 30g protein/bottle. Note BG elevated, but with no intake of solid foods, higher carb ONS appropriate. No PMH of DM noted, likely steroid related. Consider checking A1c and management of BG with SSI as appropriate. Pt on heart healthy diet which limits menu options, recommend liberalizing to regular to optimize po intake.  Diet Order:   Diet Order             Diet Heart Room service appropriate? Yes; Fluid consistency: Thin  Diet  effective now                   EDUCATION NEEDS:   Education needs have been addressed (discussed nutrition with wife at bedside)  Skin:  Skin Assessment: Reviewed RN Assessment  Height:  Ht Readings from Last 1 Encounters:  06/08/2022 5\' 10"  (1.778 m)    Weight:  Wt Readings from Last 1 Encounters:  07/04/22 68.6 kg    BMI:  Body mass index is 21.71 kg/m.  Estimated Nutritional Needs:  Kcal:  1545-1930kcal ([employer]:1561) Protein:  90-115g Fluid:  1950-2364mL  Candise Bowens, MS, RD, LDN, CNSC See [facility] for contact information

## 2022-07-04 NOTE — Progress Notes (Signed)
Pt refused chest physiotherapy at this time.

## 2022-07-04 NOTE — Progress Notes (Signed)
Pt continues to adamantly refuse BiPAP at this time.

## 2022-07-04 NOTE — Progress Notes (Signed)
PROGRESS NOTE  Jolee Ewing.  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  He was found to be  positive for COVID,found to be in acute hypoxic respiratory failure secondary to CHF exacerbation, pneumonia and COPD exacerbation.  He developed increased respiratory distress during this hospitalization and required to be transferred to [facility] service.  CTA chest showed multifocal pneumonia concerning for aspiration pneumonia, obstructive endobronchial material within the right lower lobe bronchus, near occlusive material in the right middle lobe bronchus, dense consolidation of the right lower lobe and left lower lobe.  Was intubated by ICU on 11/1, now extubated and transferred back to [facility] service on 11/4. Hospital course remarkable for persistent respiratory distress, cough, increased oxygen requirement. PCCM following  Significant events:  10/23 Admitted to [facility] 10/25 Increased O2 need to heated high flow. PCCM consulted 10/26 attempted right thoracentesis, no fluid obtained 10/26 CT-PA > no PE.  Right lower lobe consolidation with mucous, obstructing material right lower lobe and right middle lobe bronchi.  Left lower lobe and lingular airspace disease.  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  Right basilar pleural 10.8 cm fluid collection 10/28 worse infiltrates on CXR  10/30 solumedrol added, lasix to 60m BID  11/1 cont on unasyn. Intubated, bronch at bedside w mucus plugging 11/2 bronch w cryo, debulking of abnormal soft tissue occluding part of trachea,  RML RLL 11/3 extubated    [facility]   Assessment & Plan:  Principal Problem:   Acute on chronic combined systolic and diastolic congestive heart failure (HCC) Active Problems:   HLD (hyperlipidemia)   Essential hypertension   GERD (gastroesophageal reflux disease)   CAD (coronary artery disease)   Thrombocytopenia (HCC)   Stage 3b chronic kidney disease (CKD) [place]   Primary cancer of right upper lobe of lung (HCC)   Goals of care, counseling/discussion   Pleural effusion on right   Cardiomyopathy, ischemic   History of endovascular stent graft for abdominal aortic aneurysm (AAA)   Chronic obstructive pulmonary disease with acute exacerbation (HCC)   Acute respiratory failure with hypoxia (HCC)   Anemia of chronic kidney failure   COVID-19 virus infection   Aspiration pneumonia (HCC)   Urinary retention   Multifocal pneumonia   Hypotension due to drugs   Malnutrition of moderate degree   Acute on chronic respiratory failure with hypoxia and hypercapnia (HCC)   Acute on chronic hypoxic respiratory failure Still requiring high flow O2 Secondary to multifocal pneumonia, systolic CHF  exacerbation, lung cancer, COVID. Was intubated on 9/1, extubated on 11/3. Underwent bronchoscopy for right lower lobe obstruction, right middle lobe and tracheal partial obstruction due to abnormal soft tissue, status post debulking.  Has chronic right upper lobe fibrosis, chronic right pleural effusion.  History of VATS, talc pleurodesis. Pathology report from bronc on 11/2 showed yeast but no malignant cells.  Started on Diflucan.  Currently on Solu-Medrol, will continue to taper when appropriate.  Continue YCandiss Norse budesonide. Completed course of molnupiravir for COVID Hypertonic saline nebulization, chest physiotherapy. Patient also has developed hoarseness,seen by ENT , Dr BJanace Hoardno further management  We will continue to attempt to wean the oxygen PCCM on  board  Acute on chronic systolic CHF Elevated BNP Currently on 60 mg IV BID. Continue to monitor input/output, daily weights Continue Entresto, metoprolol. Echocardiogram on May 2023 showed EF of 87%, grade 1 diastolic dysfunction, repeat ECHO on 11/7 with no significant change from prior study in May Patient also has elevated troponin most likely secondary to type II MI  Hypokalemia Replace as needed  Possible oropharyngeal candidiasis Already on fluconazole, added on nystatin as well as Magic mouth wash  History of coronary artery disease Chest pain free Continue Entresto, metoprolol s/p CABG in 1996 and PCI to dLM and SVG-D in 02/2014, plavix held due to malena  CKD stage IIIb, urine retention Continue Foley.  On Flomax  Baseline creatinine around 1.8-2  Acute on chronic thrombocytopenia/chronic anemia/elevated D-dimer Duplex negative on 10/26, 11/2 Patient found to have black tarry stool on 11/7, FOBT at bedside positive GI consulted/message sent to Dr Michail Sermon, no further recs Hemoglobin has remained stable in the range of 8 for last few days, continue Protonix twice daily  GERD: Continue protonix  Hyperlipidemia: On crestor  Debility/deconditioning: PT/OT consulted       Nutrition Problem: Moderate Malnutrition Etiology: chronic illness (COPD, CHF, and new fungating soft tissue mass)    DVT prophylaxis:Place and maintain sequential compression device Start: 07/03/22 0936 Place TED hose Start: 06/19/22 1130 SCDs Start: 06/12/2022 1016     Code Status: Full Code  Family Communication: Discussed with wife at bedside.    Patient status:Inpatient  Patient is from :Home  Anticipated discharge GO:TLXB  Estimated DC date:TBD, still on high flow oxygen   Consultants: PCCM  Procedures:Intubation,bronch  Antimicrobials:  Anti-infectives (From admission, onward)    Start     Dose/Rate Route Frequency Ordered Stop   07/03/22 1000  fluconazole (DIFLUCAN)  tablet 100 mg        100 mg Oral Daily 07/02/22 1342 07/09/22 0959   07/02/22 1500  fluconazole (DIFLUCAN) tablet 200 mg        200 mg Oral  Once 07/02/22 1342 07/02/22 1422   06/23/22 0600  Ampicillin-Sulbactam (UNASYN) 3 g in sodium chloride 0.9 % 100 mL IVPB  Status:  Discontinued        3 g 200 mL/hr over 30 Minutes Intravenous Every 6 hours 06/22/22 1739 07/02/22 1344   06/19/22 2200  molnupiravir EUA (LAGEVRIO) capsule 800 mg        4 capsule Oral 2 times daily 06/19/22 1514 06/24/22 2159   06/19/22 1000  azithromycin (ZITHROMAX) 500 mg in sodium chloride 0.9 % 250 mL IVPB  Status:  Discontinued        500 mg 250 mL/hr over 60 Minutes Intravenous Every 24 hours 06/22/2022 1931 06/22/22 1711   06/19/22 1000  cefTRIAXone (ROCEPHIN) 1 g in sodium chloride 0.9 % 100 mL IVPB        1 g 200 mL/hr over 30 Minutes Intravenous Every 24 hours 06/25/2022 2120 06/22/22 1026   06/25/2022 2200  nirmatrelvir/ritonavir EUA (renal dosing) (PAXLOVID) 2 tablet  Status:  Discontinued       Note to Pharmacy: Please adjust or change treatment as needed.   2 tablet Oral 2 times daily 06/05/2022 1929 06/19/22 1514   06/21/2022 2030  cefTRIAXone (ROCEPHIN) 1 g in sodium chloride 0.9 % 100 mL IVPB  Status:  Discontinued        1 g 200 mL/hr over 30 Minutes Intravenous Every 24 hours 06/15/2022  1931 06/10/2022 2120   05/31/2022 1000  levofloxacin (LEVAQUIN) tablet 500 mg  Status:  Discontinued        500 mg Oral Daily 06/16/2022 0943 06/17/2022 1943       Subjective:  Patient still requiring about 12 L of high flow O2, denies any new complaints except for burning upon swallowing.  Noted to have possible oral Candida.  Wife at bedside    Objective: Vitals:   07/04/22 0453 07/04/22 0500 07/04/22 0916 07/04/22 1100  BP:      Pulse:      Resp:      Temp:    99.2 F (37.3 C)  TempSrc:    Oral  SpO2: 99%  100%   Weight:  68.6 kg    Height:        Intake/Output Summary (Last 24 hours) at 07/04/2022 1850 Last data  filed at 07/04/2022 1300 Gross per 24 hour  Intake 960 ml  Output 2100 ml  Net -1140 ml   Filed Weights   07/02/22 0427 07/03/22 0500 07/04/22 0500  Weight: 68.8 kg 70.4 kg 68.6 kg    Examination: General: NAD  Cardiovascular: S1, S2 present Respiratory: Noted scattered rhonchi Abdomen: Soft, nontender, nondistended, bowel sounds present Musculoskeletal: No bilateral pedal edema noted Skin: Normal Psychiatry: Normal mood     Data Reviewed: I have personally reviewed following labs and imaging studies  CBC: Recent Labs  Lab 06/29/22 0258 06/30/22 0913 07/01/22 0347 07/03/22 1011 07/04/22 0455  WBC 5.9 7.7 6.8 10.5 8.6  HGB 8.8* 8.6* 8.6* 8.8* 8.5*  HCT 28.6* 29.6* 29.5* 31.0* 29.3*  MCV 87.5 90.2 89.4 93.1 90.4  PLT 59* 65* 68* 78* 77*   Basic Metabolic Panel: Recent Labs  Lab 07/14/2022 0501 06/29/22 0258 06/30/22 0913 07/01/22 0347 07/02/22 0655 07/02/22 1234 07/03/22 0451 07/04/22 0455  NA 142 143 142 143 142  --  145 143  K 3.4* 3.7 3.5 3.5 3.4*  --  3.6 3.2*  CL 92* 95* 98 100 97*  --  102 94*  CO2 37* 38* 35* 35* 39*  --  35* 38*  GLUCOSE 123* 137* 121* 93 135*  --  115* 104*  BUN 40* 41* 40* 39* 27*  --  34* 30*  CREATININE 1.56* 1.64* 1.75* 1.65* 1.51*  --  1.68* 1.77*  CALCIUM 8.0* 7.8* 7.9* 7.8* 7.8*  --  8.0* 8.0*  MG 2.3 2.3  --   --   --  2.1 2.3 2.2  PHOS  --  3.7  --   --   --  2.3* 3.7 2.4*     Recent Results (from the past 240 hour(s))  Culture, Respiratory w Gram Stain     Status: None   Collection Time: 06/27/22  1:26 PM   Specimen: Bronchoalveolar Lavage; Respiratory  Result Value Ref Range Status   Specimen Description   Final    BRONCHIAL ALVEOLAR LAVAGE Performed at [facility] [address], [place], [place] [address]    Special Requests   Final    NONE Performed at [facility], [place] [address] [place], [place] [address]    Gram Stain   Final    NO WBC SEEN RARE BUDDING YEAST  SEEN Performed at [facility], [place] [address] [place], [place] [address]    Culture FEW CANDIDA ALBICANS  Final   Report Status 06/30/2022 FINAL  Final     Radiology Studies: ECHOCARDIOGRAM COMPLETE  Result Date: 07/03/2022  ECHOCARDIOGRAM REPORT   Patient Name:   Heber Hoog. Date of Exam: 07/03/2022 Medical Rec #:  [id number]            Height:       70.0 in Accession #:    [id number]           Weight:       155.2 lb Date of Birth:  24-May-1941             BSA:          1.874 m Patient Age:    62 years             BP:           113/99 mmHg Patient Gender: M                    HR:           96 bpm. Exam Location:  Inpatient Procedure: 2D Echo Indications:    acute respiratory distress  History:        Patient has prior history of Echocardiogram examinations, most                 recent 03/28/2022. Risk Factors:Hypertension and Dyslipidemia.  Sonographer:    Harvie Junior Referring Phys: [phone] MURALI RAMASWAMY  Sonographer Comments: Technically difficult study due to poor echo windows. IMPRESSIONS  1. Very poor quality images despite attempted Definity administration. The entire apex appears dyskinetic. Left ventricular ejection fraction, by estimation, is 30 to 35%. The left ventricle has moderately decreased function. Left ventricular endocardial border not optimally defined to evaluate regional wall motion. Left ventricular diastolic parameters are consistent with Grade I diastolic dysfunction (impaired relaxation).  2. Right ventricular systolic function is moderately reduced. The right ventricular size is normal.  3. The mitral valve is normal in structure. Trivial mitral valve regurgitation.  4. The aortic valve is tricuspid. There is mild calcification of the aortic valve. Aortic valve regurgitation is not visualized. Aortic valve sclerosis/calcification is present, without any evidence of aortic stenosis. Comparison(s): No significant change from prior study. Prior images reviewed side by  side. FINDINGS  Left Ventricle: Very poor quality images despite attempted Definity administration. The entire apex appears dyskinetic. Left ventricular ejection fraction, by estimation, is 30 to 35%. The left ventricle has moderately decreased function. Left ventricular endocardial border not optimally defined to evaluate regional wall motion. The left ventricular internal cavity size was normal in size. There is no left ventricular hypertrophy. Left ventricular diastolic parameters are consistent with Grade  I diastolic dysfunction (impaired relaxation). Normal left ventricular filling pressure. Right Ventricle: The right ventricular size is normal. Right vetricular wall thickness was not well visualized. Right ventricular systolic function is moderately reduced. Left Atrium: Left atrial size was normal in size. Right Atrium: Right atrial size was normal in size. Pericardium: There is no evidence of pericardial effusion. Mitral Valve: The mitral valve is normal in structure. Trivial mitral valve regurgitation. Tricuspid Valve: The tricuspid valve is grossly normal. Tricuspid valve regurgitation is not demonstrated. Aortic Valve: The aortic valve is tricuspid. There is mild calcification of the aortic valve. Aortic valve regurgitation is not visualized. Aortic valve sclerosis/calcification is present, without any evidence of aortic stenosis. Aortic valve mean gradient measures 3.0 mmHg. Aortic valve peak gradient measures 4.4 mmHg. Aortic valve area, by VTI measures 2.79 cm. Pulmonic Valve: The pulmonic valve was not well visualized. Aorta: The aortic root is normal in size and structure.  IAS/Shunts: The interatrial septum was not well visualized.  LEFT VENTRICLE PLAX 2D LVIDd:         5.10 cm      Diastology LVIDs:         3.80 cm      LV e' medial:    5.55 cm/s LV PW:         0.80 cm      LV E/e' medial:  10.3 LV IVS:        0.80 cm      LV e' lateral:   5.55 cm/s LVOT diam:     2.10 cm      LV E/e' lateral:  10.3 LV SV:         51 LV SV Index:   27 LVOT Area:     3.46 cm  LV Volumes (MOD) LV vol d, MOD A2C: 115.0 ml LV vol d, MOD A4C: 170.0 ml LV vol s, MOD A2C: 83.2 ml LV vol s, MOD A4C: 108.0 ml LV SV MOD A2C:     31.8 ml LV SV MOD A4C:     170.0 ml LV SV MOD BP:      53.7 ml RIGHT VENTRICLE RV Basal diam:  3.90 cm RV Mid diam:    3.60 cm TAPSE (M-mode): 1.9 cm LEFT ATRIUM           Index        RIGHT ATRIUM           Index LA diam:      2.90 cm 1.55 cm/m   RA Area:     12.70 cm LA Vol (A2C): 33.5 ml 17.88 ml/m  RA Volume:   30.80 ml  16.44 ml/m LA Vol (A4C): 40.4 ml 21.56 ml/m  AORTIC VALVE                    PULMONIC VALVE AV Area (Vmax):    2.54 cm     PV Vmax:       0.65 m/s AV Area (Vmean):   2.41 cm     PV Peak grad:  1.7 mmHg AV Area (VTI):     2.79 cm AV Vmax:           105.00 cm/s AV Vmean:          74.700 cm/s AV VTI:            0.181 m AV Peak Grad:      4.4 mmHg AV Mean Grad:      3.0 mmHg LVOT Vmax:         77.00 cm/s LVOT Vmean:        52.000 cm/s LVOT VTI:          0.146 m LVOT/AV VTI ratio: 0.81  AORTA Ao Root diam: 3.40 cm MITRAL VALVE MV Area (PHT): 3.65 cm    SHUNTS MV Decel Time: 208 msec    Systemic VTI:  0.15 m MR Peak grad: 14.1 mmHg    Systemic Diam: 2.10 cm MR Vmax:      188.00 cm/s MV E velocity: 57.40 cm/s MV A velocity: 84.40 cm/s MV E/A ratio:  0.68 Mihai Croitoru MD Electronically signed by Sanda Klein MD Signature Date/Time: 07/03/2022/4:22:02 PM    Final     Scheduled Meds:  arformoterol  15 mcg Nebulization BID   budesonide (PULMICORT) nebulizer solution  0.25 mg Nebulization BID   Chlorhexidine Gluconate Cloth  6 each Topical Daily   docusate  100  mg Oral BID   feeding supplement  237 mL Oral TID BM   fluconazole  100 mg Oral Daily   furosemide  60 mg Intravenous BID   Gerhardt's butt cream   Topical TID   guaiFENesin  15 mL Oral Q12H   insulin aspart  0-9 Units Subcutaneous Q4H   methylPREDNISolone (SOLU-MEDROL) injection  60 mg Intravenous Daily    metoprolol tartrate  37.5 mg Oral BID   nystatin  5 mL Oral QID   pantoprazole (PROTONIX) IV  40 mg Intravenous Q12H   polyethylene glycol  17 g Oral Daily   revefenacin  175 mcg Nebulization Daily   rosuvastatin  20 mg Oral QHS   sacubitril-valsartan  1 tablet Oral BID   Continuous Infusions:  sodium chloride Stopped (07/18/2022 1222)     LOS: 15 days   Alma Friendly, MD [facility] P11/03/2022, 6:50 PM

## 2022-07-05 ENCOUNTER — Inpatient Hospital Stay (HOSPITAL_COMMUNITY): Payer: Medicare Other

## 2022-07-05 DIAGNOSIS — J441 Chronic obstructive pulmonary disease with (acute) exacerbation: Secondary | ICD-10-CM | POA: Diagnosis not present

## 2022-07-05 DIAGNOSIS — I5043 Acute on chronic combined systolic (congestive) and diastolic (congestive) heart failure: Secondary | ICD-10-CM | POA: Diagnosis not present

## 2022-07-05 DIAGNOSIS — J9601 Acute respiratory failure with hypoxia: Secondary | ICD-10-CM | POA: Diagnosis not present

## 2022-07-05 DIAGNOSIS — U071 COVID-19: Secondary | ICD-10-CM | POA: Diagnosis not present

## 2022-07-05 DIAGNOSIS — J189 Pneumonia, unspecified organism: Secondary | ICD-10-CM | POA: Diagnosis not present

## 2022-07-05 DIAGNOSIS — Z515 Encounter for palliative care: Secondary | ICD-10-CM | POA: Diagnosis not present

## 2022-07-05 DIAGNOSIS — L8990 Pressure ulcer of unspecified site, unspecified stage: Secondary | ICD-10-CM | POA: Insufficient documentation

## 2022-07-05 DIAGNOSIS — J9621 Acute and chronic respiratory failure with hypoxia: Secondary | ICD-10-CM | POA: Diagnosis not present

## 2022-07-05 DIAGNOSIS — Z66 Do not resuscitate: Secondary | ICD-10-CM

## 2022-07-05 LAB — COMPREHENSIVE METABOLIC PANEL
ALT: 28 U/L (ref 0–44)
AST: 19 U/L (ref 15–41)
Albumin: 1.9 g/dL — ABNORMAL LOW (ref 3.5–5.0)
Alkaline Phosphatase: 86 U/L (ref 38–126)
Anion gap: 10 (ref 5–15)
BUN: 34 mg/dL — ABNORMAL HIGH (ref 8–23)
CO2: 39 mmol/L — ABNORMAL HIGH (ref 22–32)
Calcium: 8 mg/dL — ABNORMAL LOW (ref 8.9–10.3)
Chloride: 95 mmol/L — ABNORMAL LOW (ref 98–111)
Creatinine, Ser: 1.73 mg/dL — ABNORMAL HIGH (ref 0.61–1.24)
GFR, Estimated: 39 mL/min — ABNORMAL LOW (ref >60)
Glucose, Bld: 165 mg/dL — ABNORMAL HIGH (ref 70–99)
Potassium: 3.2 mmol/L — ABNORMAL LOW (ref 3.5–5.1)
Sodium: 144 mmol/L (ref 135–145)
Total Bilirubin: 0.6 mg/dL (ref 0.3–1.2)
Total Protein: 6 g/dL — ABNORMAL LOW (ref 6.5–8.1)

## 2022-07-05 LAB — CBC
HCT: 30 % — ABNORMAL LOW (ref 39.0–52.0)
Hemoglobin: 8.9 g/dL — ABNORMAL LOW (ref 13.0–17.0)
MCH: 26.6 pg (ref 26.0–34.0)
MCHC: 29.7 g/dL — ABNORMAL LOW (ref 30.0–36.0)
MCV: 89.8 fL (ref 80.0–100.0)
Platelets: 74 10*3/uL — ABNORMAL LOW (ref 150–400)
RBC: 3.34 MIL/uL — ABNORMAL LOW (ref 4.22–5.81)
RDW: 16.5 % — ABNORMAL HIGH (ref 11.5–15.5)
WBC: 8.4 10*3/uL (ref 4.0–10.5)
nRBC: 0 % (ref 0.0–0.2)

## 2022-07-05 LAB — GLUCOSE, CAPILLARY
Glucose-Capillary: 118 mg/dL — ABNORMAL HIGH (ref 70–99)
Glucose-Capillary: 120 mg/dL — ABNORMAL HIGH (ref 70–99)
Glucose-Capillary: 134 mg/dL — ABNORMAL HIGH (ref 70–99)
Glucose-Capillary: 162 mg/dL — ABNORMAL HIGH (ref 70–99)
Glucose-Capillary: 215 mg/dL — ABNORMAL HIGH (ref 70–99)
Glucose-Capillary: 99 mg/dL (ref 70–99)

## 2022-07-05 LAB — MAGNESIUM: Magnesium: 2.1 mg/dL (ref 1.7–2.4)

## 2022-07-05 MED ORDER — BUDESONIDE 0.5 MG/2ML IN SUSP: 0.5000 mg | Freq: Two times a day (BID) | RESPIRATORY_TRACT | Status: DC

## 2022-07-05 MED ORDER — POTASSIUM CHLORIDE CRYS ER 20 MEQ PO TBCR
40.0000 meq | EXTENDED_RELEASE_TABLET | Freq: Once | ORAL | Status: AC
  Administered 2022-07-05: 40 meq via ORAL
  Filled 2022-07-05: qty 2

## 2022-07-05 MED ORDER — SODIUM CHLORIDE 3 % IN NEBU
4.0000 mL | INHALATION_SOLUTION | Freq: Every day | RESPIRATORY_TRACT | Status: AC
  Administered 2022-07-05 – 2022-07-07 (×3): 4 mL via RESPIRATORY_TRACT
  Filled 2022-07-05 (×3): qty 4

## 2022-07-05 NOTE — Progress Notes (Signed)
  Daily Progress Note   Patient [name].       Date: 07/05/2022 DOB: [birth year]  Age: 81 y.o. MRN#: [id number] Attending Physician: Alma Friendly, MD Primary Care Physician: Coral Spikes, DO Admit Date: 05/27/2022 Length of Stay: 16 days  Will complete initial consult note when able. Placing progress note for medical update. Spoke with patient in room with wife at bedside. Discussed patient's code status. Patient and wife agreeing with changing code status to DNR/DNI at this time. Will continue appropriate level of interventions up to that point. Code status updated with patient's permission.   Chelsea Aus, DO Palliative Care Provider PMT # [phone]  If patient remains symptomatic despite maximum doses, please call PMT at [phone] between 0700 and 1900. Outside of these hours, please call attending, as PMT does not have night coverage.

## 2022-07-05 NOTE — Progress Notes (Signed)
07/05/2022 APP Kennieth Rad I saw and evaluated the patient. Discussed with them and agree with their findings and plan as documented in the their note.  I have seen and evaluated the patient for acute hypoxic respiratory failure  S:  Increased work of breathing this morning, desaturation, transferred to stepdown for [place]  O: Blood pressure (!) 121/45, pulse (!) 107, temperature 99 F (37.2 C), temperature source Axillary, resp. rate (!) 25, height _0  (1.778 m), weight 68.5 kg, SpO2 100 %.   Exam: Gen:       No acute distress HEENT:  tracking, sclera anicteric, hoarse Neck:      No masses, JVD Lungs:    bronchial breath sounds and rhonchi RUL, rhonchi diffusely; mildly tachypneic on HHFNC  CV:         tachy; no murmurs Abd:      + bowel sounds; soft, non-tender; no palpable masses, no distension Ext:    No edema; adequate peripheral perfusion Neuro:    attends, grossly nonfocal Psych:    normal mood and affect    Labs/imaging reviewed  CMP stable PLT 78  CXR stable overall relative to prior  A:  # Acute hypoxic respiratory failure - weak cough, lack of participation with CPT contributory, risk of aspiration though not reported or clearly observed # 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, BAL with candida - ordinarily wouldn't treat a patient for candida that wasn't fungemic but with cryobiopsy I agree it merits consideration # Loculated R effusion, chronic right fibrothorax s/p VATS/talc pleurodesis # History of RUL lung cancer  P:  - Wean FiO2 on HHFNC as able - diurese for net negative fluid balance - US DVT - bronchodilators, encourage CPT - ok to continue fluconazole course - try to stop steroids given surprisingly convincing evidence of possibility of at least candida tracheitis vs pneumonia - aspiration precautions  Patient critically ill due to acute hypoxic respiratory failure Interventions to address this today oversight of trial of  HHFNC Risk of deterioration without these interventions is high  I personally spent 32 minutes providing critical care not including any separately billable procedures   [place]

## 2022-07-05 NOTE — Progress Notes (Signed)
SLP Cancellation Note  Patient Details [name]. MRN: [id number] DOB: 1940/12/18   Cancelled treatment:       Reason Eval/Treat Not Completed: Medical issues which prohibited therapy  Kathleen Lime, MS [facility] SLP Acute Rehab Services Office [phone] Pager [phone]  Macario Golds 07/05/2022, 12:06 PM

## 2022-07-05 NOTE — Progress Notes (Addendum)
NAME:  Paul Sandoval., MRN:  [id number], DOB:  [birth year], LOS: 61 ADMISSION DATE:  06/16/2022, CONSULTATION DATE:  10/26 REFERRING MD:  Dr. Cyndia Skeeters, [facility] CHIE[facility] COMPLAINT:  Dyspnea   History of Present Illness:  81 y/o male admitted with acute respiratory failure with hypoxemia in the setting of COVID 19  pneumonia.  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  He is followed by Dr. Elsworth Soho in the pulmonary office.  Pertinent  Medical History  Former smoker, COPD, Chronic HFrEF (03/2022> EF 30-35%/ G1DD/ mildly reduced RVSF)/ HFpEF, HLD, CKD 3b, anemia of chronic disease; stage IIIa NSCLC RUL treated with chemo/rad 2017  Significant Hospital Events: Including procedures, antibiotic start and stop dates in addition to other pertinent events   03/28/22 baseline ECHO LVEF 35% - same since 2021 (ws 20-25% in 2019 and 2018) 10/23 Admitted to [facility] - PCR POSITIVe CRP 3.4 10/24 D-dimer 2.5 10/25 Increased O2 need to heated high flow. PCCM consulted 10/26 attempted right thoracentesis, no fluid obtained LE Duplex - neg DVT 10/26 CT-PA > no PE.  Right lower lobe consolidation with mucous, obstructing material right lower lobe and right middle lobe bronchi.  Left lower lobe and lingular airspace disease.  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  Right basilar pleural 10.8 cm fluid collection 10/28 worse infiltrates on CXR  10/30 solumedrol added, lasix to 10m BID  11/01  INTUBATED cont on unasyn  D-dimer 17.6 CRP 10.8 BRONCH BAL - CANDIDA 11/2 - Duplex LE - negative DVT 11/03 extubated 11/5 - C/o hoarseness.  Has dry cough.  Still on HFNC 07/02/22 - Few canddai ain BAL from 07/02/2022. Afebrile. Normal WBC. On 9L HFNC. RNsays easy desats when OOB to 80s and needs 15L [place]. Voice stil very hoarse.  QTC < 5043mc and [facility] started Diflucan for Candida in BAL ENT consult -for hoarsenss - no scope but being  monitored Unasyn stopped (2 week course) SEd rate 119/CRP 11 BNP 1100. Trop 177 ->126 D-dimer better 6.96 11/7 BNP> 1100, Trop 177-> 126 x 24h. REpeat ECHO pending. Only did BiPAP x 45 min last night and could not tolerate. sTill on 15L [place] and pulse 97% 11/9> transferred to SDU for worsening hypoxia> HHFNC  Interim History / Subjective:  - worsening hypoxia 79%, diaphoretic, and respiratory distress this morning ? Aspiration event> pt denies aspiration and states it started after he got up into chair - went from [place] HFNC 12> NRB> back to HFNC 16L Afebrile WBC stable  Given lasix Transferred to SDU, remains full code> seen by PMT and now DNR/ DNI Has been refusing BiPAP and CPT overnight   Currently complaining of ongoing mouth pain but has been refusing magic mouth wash and nystatin due to buring  Objective   Blood pressure (!) 99/42, pulse (!) 110, temperature 98.2 F (36.8 C), temperature source Axillary, resp. rate (!) 23, height _0  (1.778 m), weight 68.5 kg, SpO2 91 %.    FiO2 (%):  [93 %-100 %] 100 %   Intake/Output Summary (Last 24 hours) at 07/05/2022 1040 Last data filed at 07/05/2022 0457 Gross per 24 hour  Intake 600 ml  Output 1900 ml  Net -1300 ml   Filed Weights   07/03/22 0500 07/04/22 0500 07/05/22 0500  Weight: 70.4 kg 68.6 kg 68.5 kg    Examination: General:  chronically ill and deconditioned elderly male sitting upright in bed in NAD  HEENT:  MM pink/dry, some oral lesions, hoarseness  Neuro:  Awake, appropriate, MAE CV: rr ir, ST PULM:  non labored, tachypnea in upper 20's, diminished with faint exp sqeak/ wheeze on right, diminished on left, inspiratory wheeze and few rales in left base GI: soft, bs+, ND  Extremities: warm/dry, no LE edema, TED stockings in place   Labs reviewed> stable Hgb/ plts, WBC 8.4, K 3.2 bicarb 39, sCr 1.73 CXR post distress> no significant changes from prior 11/6  [facility] Problem list     Assessment &  Plan:   Acute on chronic hypoxic/hypercapnic respiratory failure from COPD/asthma exacerbation, COVID positive from 05/27/2022, and multifocal pneumonia,  Candida on BAL 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 Hx of Rt upper long lung cancer Concern for Dysphagia - extubated 11/3 - slow failure to improve despite daily lasix and Solumedroil 56m IV daily.  Labs indicating both inflammatory and s-chf component.  Unasyn stopped 11/6.  Empiric diflucan started 11/8 - overall slow/poor progression since extubation on 11/3 despite diuresis, solumedrol, abx - Echo 07/03/22> poor windows  LV 30-35, G1DD, moderately reduced RVSF Plan:  - cont HHFNC and wean as tolerated.  Currently on 45L/ 80%.  Some of this may have been exertional but since moving to SDU> unable to wean down to AM O2 requirements at 12-15L [place] HF.   - NPO, suspect aspiration - when respiratory status more stable, diet per SLP recs> needs MBS - appreciate PMT seeing patient, he is now a DNR/ DNI.  - if further decompensates, recommend transition to comfort focused care.  Expect some exertional desaturations but should recover with rest and increased O2 support during exertion - Has been refusing BiPAP and CPT - plans for 7 day course of diflucan, started 11/6 - continued diureses as tolerated> net -9.7L  - solumedrol 699mdaily > on since 9/7> ? If helping, discussed with attending on whether to continue or stop given candida.  Will stop and closely monitor.  Also stop pulmicort - nebs> cont brovanna, yupelri and prn albuterol  - cont HTS nebs for 3 days, guaifenesin and continue to try CPT, however patient has been refusing - VTE ppx/ lovenox stopped 11/7 due to thrombocytopenia and black stools.  But decompensation happened after moving to recliner and has had persistent tachycardia (low 100's) and increased O2 needs since.   If not improving in O2 needs, consider PE, low  suspicion.  CTA PE neg 10/26.   Recheck LE dopplers.    Hoarseness. - likely related to COVID and intubation - ENT to follow.     Remainder per primary team.    Chronic HFrEF. RBBB. HTN, HLD, CAD. CKD 3b. Urine retention. Anemia of critical illness and chronic disease. Thrombocytopenia in setting of critical illness. GERD.     CCT 35 mins  BrKennieth RadMSN, AG-ACNP-BC [facility] 07/05/2022, 5:38 PM  See Amion for pager If no response to pager, please call PCCM consult pager After 7:00 pm call Elink        LABS    PULMONARY  Latest Reference Range & Units 06/19/22 04:10 06/20/22 04:08 06/21/22 04:10 06/22/22 02:55 06/23/22 02:52 06/24/22 02:50 06/25/22 03:00 06/27/22 02:49  D-Dimer, Quant 0.00 - 0.50 ug/mL-FEU 2.49 (H) 2.64 (H) 7.99 (H) 7.70 (H) 8.51 (H) 8.95 (H) 11.37 (H) 17.65 (H)  (H): Data is abnormally high   CBC Recent Labs  Lab 07/03/22 1011 07/04/22 0455 07/05/22 0358  HGB 8.8*  8.5* 8.9*  HCT 31.0* 29.3* 30.0*  WBC 10.5 8.6 8.4  PLT 78* 77* 74*    COAGULATION No results for input(s): "INR" in the last 168 hours.  CARDIAC  No results for input(s): "TROPONINI" in the last 168 hours. No results for input(s): "PROBNP" in the last 168 hours.   CHEMISTRY Recent Labs  Lab 06/29/22 0258 06/30/22 0913 07/01/22 0347 07/02/22 0655 07/02/22 1234 07/03/22 0451 07/04/22 0455 07/05/22 0358  NA 143   < > 143 142  --  145 143 144  K 3.7   < > 3.5 3.4*  --  3.6 3.2* 3.2*  CL 95*   < > 100 97*  --  102 94* 95*  CO2 38*   < > 35* 39*  --  35* 38* 39*  GLUCOSE 137*   < > 93 135*  --  115* 104* 165*  BUN 41*   < > 39* 27*  --  34* 30* 34*  CREATININE 1.64*   < > 1.65* 1.51*  --  1.68* 1.77* 1.73*  CALCIUM 7.8*   < > 7.8* 7.8*  --  8.0* 8.0* 8.0*  MG 2.3  --   --   --  2.1 2.3 2.2 2.1  PHOS 3.7  --   --   --  2.3* 3.7 2.4*  --    < > = values in this interval not displayed.   Estimated Creatinine Clearance: 31.9 mL/min (A)  (by C-G formula based on SCr of 1.73 mg/dL (H)).   LIVER Recent Labs  Lab 07/04/22 0455 07/05/22 0358  AST 23 19  ALT 28 28  ALKPHOS 86 86  BILITOT 0.7 0.6  PROT 5.5* 6.0*  ALBUMIN 1.9* 1.9*     INFECTIOUS Recent Labs  Lab 07/02/22 1234  LATICACIDVEN 2.1*  PROCALCITON 0.14     ENDOCRINE CBG (last 3)  Recent Labs    07/05/22 0007 07/05/22 0429 07/05/22 0804  GLUCAP 118* 162* 120*         IMAGING x48h  - image(s) personally visualized  -   highlighted in bold DG Chest Port 1 View  Result Date: 07/05/2022 CLINICAL DATA:  Acute respiratory distress EXAM: PORTABLE CHEST 1 VIEW COMPARISON:  07/02/2022 chest radiograph. FINDINGS: Intact sternotomy wires. CABG clips overlie the mediastinum. Stable cardiomediastinal silhouette with top-normal heart size. No pneumothorax. Stable volume loss in the right hemithorax and dense upper right lung consolidation. Stable patchy bibasilar lung opacities. Stable mild blunting of the bilateral costophrenic angles. IMPRESSION: 1. Stable volume loss in the right hemithorax and dense upper right lung consolidation. Stable patchy bibasilar lung opacities, favor atelectasis. Findings favor multilobar pneumonia as described on recent chest CT. 2. Stable mild blunting of the bilateral costophrenic angles, cannot exclude small bilateral pleural effusions. Electronically Signed   By: Ilona Sorrel M.D.   On: 07/05/2022 10:10   ECHOCARDIOGRAM COMPLETE  Result Date: 07/03/2022    ECHOCARDIOGRAM REPORT   Patient Name:   Anel Purohit. Date of Exam: 07/03/2022 Medical Rec #:  [id number]            Height:       70.0 in Accession #:    [id number]           Weight:       155.2 lb Date of Birth:  Mar 12, 1941             BSA:          1.874 m Patient Age:  81 years             BP:           113/99 mmHg Patient Gender: M                    HR:           96 bpm. Exam Location:  Inpatient Procedure: 2D Echo Indications:    acute respiratory distress   History:        Patient has prior history of Echocardiogram examinations, most                 recent 03/28/2022. Risk Factors:Hypertension and Dyslipidemia.  Sonographer:    Harvie Junior Referring Phys: [phone] MURALI RAMASWAMY  Sonographer Comments: Technically difficult study due to poor echo windows. IMPRESSIONS  1. Very poor quality images despite attempted Definity administration. The entire apex appears dyskinetic. Left ventricular ejection fraction, by estimation, is 30 to 35%. The left ventricle has moderately decreased function. Left ventricular endocardial border not optimally defined to evaluate regional wall motion. Left ventricular diastolic parameters are consistent with Grade I diastolic dysfunction (impaired relaxation).  2. Right ventricular systolic function is moderately reduced. The right ventricular size is normal.  3. The mitral valve is normal in structure. Trivial mitral valve regurgitation.  4. The aortic valve is tricuspid. There is mild calcification of the aortic valve. Aortic valve regurgitation is not visualized. Aortic valve sclerosis/calcification is present, without any evidence of aortic stenosis. Comparison(s): No significant change from prior study. Prior images reviewed side by side. FINDINGS  Left Ventricle: Very poor quality images despite attempted Definity administration. The entire apex appears dyskinetic. Left ventricular ejection fraction, by estimation, is 30 to 35%. The left ventricle has moderately decreased function. Left ventricular endocardial border not optimally defined to evaluate regional wall motion. The left ventricular internal cavity size was normal in size. There is no left ventricular hypertrophy. Left ventricular diastolic parameters are consistent with Grade  I diastolic dysfunction (impaired relaxation). Normal left ventricular filling pressure. Right Ventricle: The right ventricular size is normal. Right vetricular wall thickness was not well visualized.  Right ventricular systolic function is moderately reduced. Left Atrium: Left atrial size was normal in size. Right Atrium: Right atrial size was normal in size. Pericardium: There is no evidence of pericardial effusion. Mitral Valve: The mitral valve is normal in structure. Trivial mitral valve regurgitation. Tricuspid Valve: The tricuspid valve is grossly normal. Tricuspid valve regurgitation is not demonstrated. Aortic Valve: The aortic valve is tricuspid. There is mild calcification of the aortic valve. Aortic valve regurgitation is not visualized. Aortic valve sclerosis/calcification is present, without any evidence of aortic stenosis. Aortic valve mean gradient measures 3.0 mmHg. Aortic valve peak gradient measures 4.4 mmHg. Aortic valve area, by VTI measures 2.79 cm. Pulmonic Valve: The pulmonic valve was not well visualized. Aorta: The aortic root is normal in size and structure. IAS/Shunts: The interatrial septum was not well visualized.  LEFT VENTRICLE PLAX 2D LVIDd:         5.10 cm      Diastology LVIDs:         3.80 cm      LV e' medial:    5.55 cm/s LV PW:         0.80 cm      LV E/e' medial:  10.3 LV IVS:        0.80 cm      LV e' lateral:   5.55 cm/s  LVOT diam:     2.10 cm      LV E/e' lateral: 10.3 LV SV:         51 LV SV Index:   27 LVOT Area:     3.46 cm  LV Volumes (MOD) LV vol d, MOD A2C: 115.0 ml LV vol d, MOD A4C: 170.0 ml LV vol s, MOD A2C: 83.2 ml LV vol s, MOD A4C: 108.0 ml LV SV MOD A2C:     31.8 ml LV SV MOD A4C:     170.0 ml LV SV MOD BP:      53.7 ml RIGHT VENTRICLE RV Basal diam:  3.90 cm RV Mid diam:    3.60 cm TAPSE (M-mode): 1.9 cm LEFT ATRIUM           Index        RIGHT ATRIUM           Index LA diam:      2.90 cm 1.55 cm/m   RA Area:     12.70 cm LA Vol (A2C): 33.5 ml 17.88 ml/m  RA Volume:   30.80 ml  16.44 ml/m LA Vol (A4C): 40.4 ml 21.56 ml/m  AORTIC VALVE                    PULMONIC VALVE AV Area (Vmax):    2.54 cm     PV Vmax:       0.65 m/s AV Area (Vmean):   2.41 cm      PV Peak grad:  1.7 mmHg AV Area (VTI):     2.79 cm AV Vmax:           105.00 cm/s AV Vmean:          74.700 cm/s AV VTI:            0.181 m AV Peak Grad:      4.4 mmHg AV Mean Grad:      3.0 mmHg LVOT Vmax:         77.00 cm/s LVOT Vmean:        52.000 cm/s LVOT VTI:          0.146 m LVOT/AV VTI ratio: 0.81  AORTA Ao Root diam: 3.40 cm MITRAL VALVE MV Area (PHT): 3.65 cm    SHUNTS MV Decel Time: 208 msec    Systemic VTI:  0.15 m MR Peak grad: 14.1 mmHg    Systemic Diam: 2.10 cm MR Vmax:      188.00 cm/s MV E velocity: 57.40 cm/s MV A velocity: 84.40 cm/s MV E/A ratio:  0.68 Mihai Croitoru MD Electronically signed by Sanda Klein MD Signature Date/Time: 07/03/2022/4:22:02 PM    Final

## 2022-07-05 NOTE — Consult Note (Signed)
Consultation Note Date: 07/05/2022   Patient [name].  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  Patient had recently been admitted and had just been discharged from hospital on 06/07/2022.  During this prolonged hospitalization, patient has required intubation for bronchoscopy which showed yeast present in the lungs and mucous plugging.  Imaging has shown multifocal pneumonia.  Patient was transferred back to ICU on 07/05/2022 for worsening of his respiratory status. Palliative care consulted to assist with complex medical decision-making.  Of note, this provider had met patient during prior hospitalization and was known with patient and his wife.  Extensive review of EMR prior to seeing patient.  Discussed care with bedside RN as well for updates.  Patient had rapid response called this morning due to increased work of breathing and so was transferred back to the ICU.  Patient currently on COVID precautions.  Presented to bedside and able to introduce myself and the role of the palliative care team to patient and his wife who was also present at bedside.  Able to discuss I had seen patient during his prior hospitalization and both the patient and his wife remember talking with me previously.  Inquired how patient was feeling at this time and is noted that his breathing is "better" than what it had been.  Able to explore medical updates patient and wife had received.  Able to review patient's imaging with  him of his lungs to so the damage that has been present causing his worsening respiratory status.  Patient and wife appreciated reviewing this.  With this in mind, discussed with patient that I know he had been reluctant to discuss his stated wishes on his living will previously though think it is important to open up discussions again with patient being so critically ill at this time.  Patient is hoping his breathing continues to improve which we noted all appropriate medical interventions are being done to assist with this.  Also, with permission, able to discuss worst-case scenarios with patient.  Expressed concern about patient's respiratory status worsening again to the point he may require intubation.  We discussed that if patient is sick enough that his heart stops or he stops breathing, putting him on a ventilator is not going to fix the damage that has already been inflicted to his lungs.  Patient and wife voiced hearing this.  We discussed changing his CODE STATUS from full code to DNR/DNI in light of this and patient and wife agreed with transition to DNR at this time.  Discussed continuing appropriate level of medical care at this time such as patient is getting in the ICU, though noted would need to continue open discussions moving forward in case his lungs do continue to worsen and we instead need to focus on his comfort for the time he does have.  Patient and wife acknowledged this as well.  All questions answered at that time.  Thanked them for allowing me to meet with them today.  Primary Diagnoses  Present on Admission:  Acute on chronic combined systolic and diastolic congestive heart failure (HCC)  Chronic obstructive pulmonary disease with acute exacerbation (HCC)  CAD (coronary artery disease)  Cardiomyopathy, ischemic  Essential hypertension  GERD (gastroesophageal reflux disease)  HLD (hyperlipidemia)  Stage 3b chronic kidney disease (CKD) (HCC)  Thrombocytopenia (HCC)  Acute  respiratory failure with hypoxia ([place])  COVID-19 virus infection  Primary cancer of right upper lobe of lung ([place])  Anemia of chronic kidney failure   Past Medical History:  Diagnosis Date   AAA (abdominal aortic aneurysm) ([place]) 2010   4.4 cm 08/2008;4.44 in 7/10 and 4.65 in 08/2009; 4.8 by CT in 11/2009; 4.3 by ultrasound in 08/2010   Anemia    Arteriosclerotic cardiovascular disease (ASCVD) 1996   CABG-1996   Arthritis    "fingers" (03/18/2014)   CAD (coronary artery disease)    03/18/14:  PCI with DES to distal left main. 7/29: DES to the SVG to Diag   Cancer [facility])    Upper right lobe lung cancer   Cardiomyopathy, ischemic    Echo 03/17/14: EF 45-50%   CHF (congestive heart failure) (HCC)    Chronic bronchitis (HCC)    Chronic kidney disease    CRF   Chronic rhinitis    Colonic polyp 2002   polypectomy in 2002   COPD (chronic obstructive pulmonary disease) (HCC)    Diverticulosis    Dyspnea    with exertion   ED (erectile dysfunction)    Encounter for antineoplastic chemotherapy 12/19/2015   GERD (gastroesophageal reflux disease)    History of blood transfusion    Hyperlipidemia    Hypertension    IFG (impaired fasting glucose)    Myocardial infarction [facility])    "told h/o silent MI sometime before 1996"   Pneumonia ~ 2001; ~ 2005    has had more than twice   Right bundle branch block    Tobacco abuse, in remission    40 pack year total consumption; discontinued in 1996   Social History   Socioeconomic History   Marital status: Married    Spouse name: Ann   Number of children: 1   Years of education: Not on file   Highest education level: Not on file  Occupational History   Occupation: Retired    Comment: [employer]  Tobacco Use   Smoking status: Former    Packs/day: 1.50    Years: 30.00    Total pack years: 45.00    Types: Cigarettes    Start date: 12/01/1956    Quit date: 01/08/1995    Years since quitting: 27.5   Smokeless tobacco: Never  Vaping  Use   Vaping Use: Never used  Substance and Sexual Activity   Alcohol use: No    Alcohol/week: 0.0 standard drinks of alcohol    Comment: 03/18/2014 "no alacohol since 1996"   Drug use: No   Sexual activity: Never  Other Topics Concern   Not on file  Social History Narrative   Married since 09/18/1964.    1 son Legrand Como. No grandchildren.   Social Determinants of Health   Financial Resource Strain: Low Risk  (08/08/2021)   Overall Financial Resource Strain (CARDIA)    Difficulty of Paying Living Expenses: Not hard at all  Food Insecurity: No Food Insecurity (06/15/2022)   Hunger Vital Sign    Worried About Running Out of Food in the Last Year: Never true    Ran Out of Food in the Last Year: Never true  Transportation Needs: No Transportation Needs (06/13/2022)   PRAPARE - Hydrologist (Medical): No    Lack of Transportation (Non-Medical): No  Physical Activity: Insufficiently Active (08/08/2021)   Exercise Vital Sign    Days of Exercise per Week: 3 days    Minutes of Exercise per Session: 20 min  Stress: No Stress Concern Present (08/08/2021)   [place]    Feeling of Stress : Not at all  Social Connections: [place] (08/08/2021)   Social Connection and Isolation Panel [NHANES]    Frequency of Communication with Friends and Family: More than three times a week    Frequency of Social Gatherings with Friends and Family: Three times a week    Attends Religious Services: More than 4 times per year    Active Member of Clubs or Organizations: Yes    Attends Archivist Meetings: More than 4 times per year    Marital Status: Married   Family History  Problem Relation Age of Onset   Heart disease Father    Cancer Father        Lung   Arthritis Mother    Parkinsonism Mother    Arthritis Sister        Brother with rheumatoid arthritis   Hypertension Brother     Colon cancer Neg Hx    Colon polyps Neg Hx    Scheduled Meds:  arformoterol  15 mcg Nebulization BID   budesonide (PULMICORT) nebulizer solution  0.5 mg Nebulization BID   Chlorhexidine Gluconate Cloth  6 each Topical Daily   docusate  100 mg Oral BID   feeding supplement  237 mL Oral TID BM   fluconazole  100 mg Oral Daily   furosemide  60 mg Intravenous BID   Gerhardt's butt cream   Topical TID   guaiFENesin  15 mL Oral Q12H   insulin aspart  0-9 Units Subcutaneous Q4H   methylPREDNISolone (SOLU-MEDROL) injection  60 mg Intravenous Daily   nystatin  5 mL Oral QID   pantoprazole (PROTONIX) IV  40 mg Intravenous Q12H   polyethylene glycol  17 g Oral Daily   revefenacin  175 mcg Nebulization Daily   rosuvastatin  20 mg Oral QHS   sodium chloride HYPERTONIC  4 mL Nebulization Daily   Continuous Infusions:  sodium chloride Stopped (07/18/2022 1222)   PRN Meds:.acetaminophen **OR** acetaminophen, baclofen, levalbuterol **AND** ipratropium, magic mouthwash, menthol-cetylpyridinium, ondansetron **OR** ondansetron (ZOFRAN) IV, mouth rinse, phenol, senna-docusate, sodium chloride, traZODone Allergies  Allergen Reactions   Neomycin Hives, Itching and Rash   Anoro Ellipta [Umeclidinium-Vilanterol] Itching and Rash    Rash over face, neck   Cetirizine & Related Rash   Stiolto Respimat [Tiotropium Bromide-Olodaterol] Itching and Rash    Rash over face , neck   CBC:    Component Value Date/Time   WBC 8.4 07/05/2022 0358   HGB 8.9 (L) 07/05/2022 0358   HGB 11.7 (L) 03/12/2022 0837   HGB 11.9 (L) 05/23/2020 0821   HGB 11.1 (L) 05/21/2017 0906   HCT 30.0 (L) 07/05/2022 0358   HCT 37.0 (L) 05/23/2020 0821   HCT 36.0 (L) 05/21/2017 0906   PLT 74 (L) 07/05/2022 0358   PLT 123 (L) 03/12/2022 0837   PLT 98 (L) 05/21/2017 0906   PLT 138 (L) 02/15/2016 0843   MCV 89.8 07/05/2022 0358   MCV 89 05/23/2020 0821   MCV 86.5 05/21/2017 0906   NEUTROABS 9.2 (H)  06/23/2022 0252   NEUTROABS 11.2  (H) 05/23/2020 0821   NEUTROABS 5.3 05/21/2017 0906   LYMPHSABS 0.1 (L) 06/23/2022 0252   LYMPHSABS 1.1 05/23/2020 0821   LYMPHSABS 0.8 (L) 05/21/2017 0906   MONOABS 0.1 06/23/2022 0252   MONOABS 0.8 05/21/2017 0906   EOSABS 0.0 06/23/2022 0252   EOSABS 0.0 05/23/2020 0821   BASOSABS 0.0 06/23/2022 0252   BASOSABS 0.0 05/23/2020 0821   BASOSABS 0.0 05/21/2017 0906   Comprehensive Metabolic Panel:    Component Value Date/Time   NA 144 07/05/2022 0358   NA 140 05/23/2020 0821   NA 137 05/21/2017 0906   K 3.2 (L) 07/05/2022 0358   K 4.1 05/21/2017 0906   CL 95 (L) 07/05/2022 0358   CO2 39 (H) 07/05/2022 0358   CO2 29 05/21/2017 0906   BUN 34 (H) 07/05/2022 0358   BUN 28 (H) 05/23/2020 0821   BUN 14.6 05/21/2017 0906   CREATININE 1.73 (H) 07/05/2022 0358   CREATININE 1.75 (H) 03/12/2022 0837   CREATININE 1.2 05/21/2017 0906   GLUCOSE 165 (H) 07/05/2022 0358   GLUCOSE 95 05/21/2017 0906   CALCIUM 8.0 (L) 07/05/2022 0358   CALCIUM 9.4 05/21/2017 0906   AST 19 07/05/2022 0358   AST 12 (L) 03/12/2022 0837   AST 19 05/21/2017 0906   ALT 28 07/05/2022 0358   ALT 7 03/12/2022 0837   ALT 14 05/21/2017 0906   ALKPHOS 86 07/05/2022 0358   ALKPHOS 86 05/21/2017 0906   BILITOT 0.6 07/05/2022 0358   BILITOT 0.5 03/12/2022 0837   BILITOT 0.57 05/21/2017 0906   PROT 6.0 (L) 07/05/2022 0358   PROT 6.9 05/23/2020 0821   PROT 7.1 05/21/2017 0906   ALBUMIN 1.9 (L) 07/05/2022 0358   ALBUMIN 4.1 05/23/2020 0821   ALBUMIN 3.1 (L) 05/21/2017 0906    Physical Exam: Vital Signs: BP 109/65   Pulse (!) 109   Temp 98.6 F (37 C) (Oral)   Resp (!) 29   Ht _0  (1.778 m)   Wt 68.5 kg   SpO2 98%   BMI 21.67 kg/m  SpO2: SpO2: 98 % O2 Device: O2 Device: High Flow Nasal Cannula O2 Flow Rate: O2 Flow Rate (L/min): 40 L/min Intake/output summary:  Intake/Output Summary (Last 24 hours) at 07/05/2022 1325 Last data filed at 07/05/2022 0457 Gross per 24 hour  Intake 360 ml  Output 1250  ml  Net -890 ml   LBM: Last BM Date : 07/04/22 Baseline Weight: Weight: 78.9 kg Most recent weight: Weight: 68.5 kg  General: NAD, alert, voice is hoarse though able to participate in conversation chronically ill-appearing Eyes: conjunctiva clear, anicteric sclera HENT: Dry mucous membranes Cardiovascular: Tachycardia noted Respiratory: Increased work of breathing noted, on high flow via nasal cannula in addition to known rebreather mask on top of it Abdomen: not distended Extremities: Able to move all extremities though weak Skin: no rashes or lesions on visible skin Neuro: A&Ox4, following commands easily Psych: appropriately answers all questions          Palliative Performance Scale: 30%               Additional Data Reviewed: Recent Labs    07/04/22 0455 07/05/22 0358  WBC 8.6 8.4  HGB 8.5* 8.9*  PLT 77* 74*  NA 143 144  BUN 30* 34*  CREATININE 1.77* 1.73*    Imaging: DG Chest Port 1 View CLINICAL DATA:  Acute respiratory distress  EXAM: PORTABLE CHEST 1 VIEW  COMPARISON:  07/02/2022 chest radiograph.  FINDINGS: Intact sternotomy wires. CABG clips overlie the mediastinum. Stable cardiomediastinal silhouette with top-normal heart size. No pneumothorax. Stable volume loss in the right hemithorax and dense upper right lung consolidation. Stable patchy bibasilar lung opacities. Stable mild blunting of the bilateral costophrenic angles.  IMPRESSION: 1. Stable volume loss in the right hemithorax and dense upper right lung consolidation. Stable patchy bibasilar lung opacities, favor atelectasis. Findings favor multilobar pneumonia as described on recent chest CT. 2. Stable mild blunting of the bilateral costophrenic angles, cannot exclude small bilateral pleural effusions.  Electronically Signed   By: Ilona Sorrel M.D.   On: 07/05/2022 10:10    I personally reviewed recent imaging.   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  Patient had recently been admitted and had just been discharged from hospital on 06/07/2022.  During this prolonged hospitalization, patient has required intubation for bronchoscopy which showed yeast present in the lungs and mucous plugging.  Imaging has shown multifocal pneumonia.  Patient was transferred back to ICU on 07/05/2022 for worsening of his respiratory status. Palliative care consulted to assist with complex medical decision-making.  Of note, this provider had met patient during prior hospitalization and was known with patient and his wife.   # Complex medical decision making/goals of care  -Discussion held with patient while his wife was at bedside as described above in HPI.  Patient agreeing with continuing current appropriate level of interventions to attempt to improve his breathing and.  Did discuss and even reviewed imaging to show severity of damage to patient's lungs.  Discussed would need to continue conversations moving forward about potentially focusing on comfort should patient's medical status further deteriorate.  -  Code Status: DNR    -Discussion held as described above in HPI.  Patient agreeing with change of CODE STATUS from full code to DNR/DNI.   # Psycho-social/Spiritual Support:  - Support System: Wife, son  # Discharge Planning: TBD, remains in ICU  Thank you for allowing the palliative care team to participate in the care [place].Chelsea Aus, DO Palliative Care Provider PMT # [phone]  If patient remains symptomatic despite maximum doses, please call PMT at [phone] between 0700 and 1900. Outside of  these hours, please call attending, as PMT does not have night coverage.

## 2022-07-05 NOTE — Progress Notes (Signed)
PT Cancellation Note  Patient Details [name]. MRN: [id number] DOB: August 15, 1941   Cancelled Treatment:    Reason Eval/Treat Not Completed: Medical issues which prohibited therapy Transfer to ICU. [place] Office [phone] Weekend [phone]   Claretha Cooper 07/05/2022, 10:15 AM

## 2022-07-05 NOTE — Progress Notes (Signed)
Charma Igo (spouse) contacted with update.

## 2022-07-05 NOTE — Progress Notes (Addendum)
HR increased 120s, Pt complained of worsening SOB. Upon RN entry to room pt was diaphoretic, dyspneic. Oxygen saturation 79% on 13 lt HFNC,. HFNC increased 15 lt sat increased 82%. Nonrebreather placed o2 improved sat 92%. Pt is A&Ox 4. Hospitalist, Rapid response and RT contacted to assist..

## 2022-07-05 NOTE — Progress Notes (Signed)
PROGRESS NOTE  Paul Sandoval.  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  He was found to be  positive for COVID,found to be in acute hypoxic respiratory failure secondary to CHF exacerbation, pneumonia and COPD exacerbation.  He developed increased respiratory distress during this hospitalization and required to be transferred to [facility] service.  CTA chest showed multifocal pneumonia concerning for aspiration pneumonia, obstructive endobronchial material within the right lower lobe bronchus, near occlusive material in the right middle lobe bronchus, dense consolidation of the right lower lobe and left lower lobe.  Was intubated by ICU on 11/1, now extubated and transferred back to [facility] service on 11/4. Hospital course remarkable for persistent respiratory distress, cough, increased oxygen requirement. PCCM following  Significant events:  10/23 Admitted to [facility] 10/25 Increased O2 need to heated high flow. PCCM consulted 10/26 attempted right thoracentesis, no fluid obtained 10/26 CT-PA > no PE.  Right lower lobe consolidation with mucous, obstructing material right lower lobe and right middle lobe bronchi.  Left lower lobe and lingular airspace disease.  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  Right basilar pleural 10.8 cm fluid collection 10/28 worse infiltrates on CXR  10/30 solumedrol added, lasix to 31m BID  11/1 cont on unasyn. Intubated, bronch at bedside w mucus plugging 11/2 bronch w cryo, debulking of abnormal soft tissue occluding part of trachea,  RML RLL 11/3 extubated  [facility] Transferred to SDU due to worsening respiratory status requiring non-rebreather    Assessment & Plan:  Principal Problem:   Acute on chronic combined systolic and diastolic congestive heart failure (HCC) Active Problems:   HLD (hyperlipidemia)   Essential hypertension   GERD (gastroesophageal reflux disease)   CAD (coronary artery disease)   Thrombocytopenia (HCC)   Stage 3b chronic kidney disease (CKD) [place]   Primary cancer of right upper lobe of lung (HCC)   Goals of care, counseling/discussion   Pleural effusion on right   Cardiomyopathy, ischemic   History of endovascular stent graft for abdominal aortic aneurysm (AAA)   Chronic obstructive pulmonary disease with acute exacerbation (HCC)   Acute respiratory failure with hypoxia (HCC)   Anemia of chronic kidney failure   COVID-19 virus infection   Aspiration pneumonia (HCC)   Urinary retention   Multifocal pneumonia   Hypotension due to drugs   Malnutrition of moderate degree   Acute on chronic respiratory failure with hypoxia and hypercapnia (HCC)   Pressure injury of skin   Palliative care encounter   DNR (do not resuscitate)   Acute on chronic hypoxic respiratory failure Worsened resp status on 11/9 requiring non-rebreather  Secondary to multifocal pneumonia, systolic CHF  exacerbation, lung cancer, COVID. Was intubated on 9/1, extubated on 11/3. Underwent bronchoscopy for right lower lobe obstruction, right middle lobe and tracheal partial obstruction due to abnormal soft tissue, status post debulking.  Has chronic right upper lobe fibrosis, chronic right pleural effusion.  History of VATS, talc pleurodesis. Pathology report from bronc on 11/2 showed yeast but no malignant cells.  Started on Diflucan.  Currently on Solu-Medrol, will continue to taper when appropriate.  Continue Candiss Norse, budesonide. Completed course of molnupiravir for  COVID Hypertonic saline nebulization, chest physiotherapy. Patient also has developed hoarseness,seen by ENT , Dr Janace Hoard no further management Worsened resp status on 11/9 requiring non-rebreather, transferred to SDU PCCM on board  Acute on chronic systolic CHF Elevated BNP Currently on 60 mg IV BID. Continue to monitor input/output, daily weights Continue Entresto, metoprolol. Echocardiogram on May 2023 showed EF of 88%, grade 1 diastolic dysfunction, repeat ECHO on 11/7 with no significant change from prior study in May Patient also has elevated troponin most likely secondary to type II MI  Hypokalemia Replace as needed  Possible oropharyngeal candidiasis Already on fluconazole, added on nystatin as well as Magic mouth wash  History of coronary artery disease Chest pain free Continue Entresto, metoprolol s/p CABG in 1996 and PCI to dLM and SVG-D in 02/2014, plavix held due to malena  CKD stage IIIb, urine retention Continue Foley.  On Flomax  Baseline creatinine around 1.8-2  Acute on chronic thrombocytopenia/chronic anemia/elevated D-dimer Duplex negative on 10/26, 11/2 Patient found to have black tarry stool on 11/7, FOBT at bedside positive GI consulted/message sent to Dr Michail Sermon, no further recs Hemoglobin has remained stable in the range of 8 for last few days, continue Protonix twice daily  GERD: Continue protonix  Hyperlipidemia: On crestor  Debility/deconditioning: PT/OT consulted  Goals of care discussion Due to poor prognosis, palliative care consulted Changed to DNR on 07/05/2022       Nutrition Problem: Moderate Malnutrition Etiology: chronic illness (COPD, CHF, and new fungating soft tissue mass)    DVT prophylaxis:Place and maintain sequential compression device Start: 07/03/22 0936 Place TED hose Start: 06/19/22 1130 SCDs Start: 06/06/2022 1016     Code Status: DNR  Family Communication: None at bedside.    Patient  status:Inpatient  Patient is from :Home  Anticipated discharge to: TBD  Estimated DC date:TBD   Consultants: PCCM  Procedures:Intubation,bronch  Antimicrobials:  Anti-infectives (From admission, onward)    Start     Dose/Rate Route Frequency Ordered Stop   07/03/22 1000  fluconazole (DIFLUCAN) tablet 100 mg        100 mg Oral Daily 07/02/22 1342 07/09/22 0959   07/02/22 1500  fluconazole (DIFLUCAN) tablet 200 mg        200 mg Oral  Once 07/02/22 1342 07/02/22 1422   06/23/22 0600  Ampicillin-Sulbactam (UNASYN) 3 g in sodium chloride 0.9 % 100 mL IVPB  Status:  Discontinued        3 g 200 mL/hr over 30 Minutes Intravenous Every 6 hours 06/22/22 1739 07/02/22 1344   06/19/22 2200  molnupiravir EUA (LAGEVRIO) capsule 800 mg        4 capsule Oral 2 times daily 06/19/22 1514 06/24/22 2159   06/19/22 1000  azithromycin (ZITHROMAX) 500 mg in sodium chloride 0.9 % 250 mL IVPB  Status:  Discontinued        500 mg 250 mL/hr over 60 Minutes Intravenous Every 24 hours 06/20/2022 1931 06/22/22 1711   06/19/22 1000  cefTRIAXone (ROCEPHIN) 1 g in sodium chloride 0.9 % 100 mL IVPB        1 g 200 mL/hr over 30 Minutes Intravenous Every 24 hours 06/11/2022 2120 06/22/22 1026   06/16/2022 2200  nirmatrelvir/ritonavir EUA (renal dosing) (PAXLOVID) 2 tablet  Status:  Discontinued       Note to Pharmacy: Please adjust or change treatment as needed.   2 tablet Oral 2 times daily 06/21/2022 1929 06/19/22 1514  06/24/2022 2030  cefTRIAXone (ROCEPHIN) 1 g in sodium chloride 0.9 % 100 mL IVPB  Status:  Discontinued        1 g 200 mL/hr over 30 Minutes Intravenous Every 24 hours 06/26/2022 1931 06/14/2022 2120   06/19/2022 1000  levofloxacin (LEVAQUIN) tablet 500 mg  Status:  Discontinued        500 mg Oral Daily 05/30/2022 0943 06/08/2022 1943       Subjective: Today, patient complained of worsening shortness of breath, patient noted to be diaphoretic and dyspneic, with oxygen saturation in the 70s on about 13 L of  high flow nasal cannula.  Noted to be tachycardic with heart rate in the 120s.  Rapid response, RT were alerted.  Patient was placed on a nonrebreather.  PCCM was consulted for further management.  Patient was transferred to SDU.  Palliative care also consulted, patient switched CODE STATUS to DNR.     Objective: Vitals:   07/05/22 0958 07/05/22 1011 07/05/22 1100 07/05/22 1149  BP: (!) 96/55 (!) 99/42 109/65   Pulse: (!) 113 (!) 110 (!) 109   Resp: (!) 22 (!) 23 (!) 29   Temp: 98.2 F (36.8 C)   98.6 F (37 C)  TempSrc: Axillary   Oral  SpO2: 99% 91% 98%   Weight:      Height:        Intake/Output Summary (Last 24 hours) at 07/05/2022 1352 Last data filed at 07/05/2022 0457 Gross per 24 hour  Intake 360 ml  Output 1250 ml  Net -890 ml   Filed Weights   07/03/22 0500 07/04/22 0500 07/05/22 0500  Weight: 70.4 kg 68.6 kg 68.5 kg    Examination: General: In significant distress Cardiovascular: S1, S2 present Respiratory: Noted scattered rhonchi Abdomen: Soft, nontender, nondistended, bowel sounds present Musculoskeletal: No bilateral pedal edema noted Skin: Normal Psychiatry: Fair mood     Data Reviewed: I have personally reviewed following labs and imaging studies  CBC: Recent Labs  Lab 06/30/22 0913 07/01/22 0347 07/03/22 1011 07/04/22 0455 07/05/22 0358  WBC 7.7 6.8 10.5 8.6 8.4  HGB 8.6* 8.6* 8.8* 8.5* 8.9*  HCT 29.6* 29.5* 31.0* 29.3* 30.0*  MCV 90.2 89.4 93.1 90.4 89.8  PLT 65* 68* 78* 77* 74*   Basic Metabolic Panel: Recent Labs  Lab 06/29/22 0258 06/30/22 0913 07/01/22 0347 07/02/22 0655 07/02/22 1234 07/03/22 0451 07/04/22 0455 07/05/22 0358  NA 143   < > 143 142  --  145 143 144  K 3.7   < > 3.5 3.4*  --  3.6 3.2* 3.2*  CL 95*   < > 100 97*  --  102 94* 95*  CO2 38*   < > 35* 39*  --  35* 38* 39*  GLUCOSE 137*   < > 93 135*  --  115* 104* 165*  BUN 41*   < > 39* 27*  --  34* 30* 34*  CREATININE 1.64*   < > 1.65* 1.51*  --  1.68* 1.77*  1.73*  CALCIUM 7.8*   < > 7.8* 7.8*  --  8.0* 8.0* 8.0*  MG 2.3  --   --   --  2.1 2.3 2.2 2.1  PHOS 3.7  --   --   --  2.3* 3.7 2.4*  --    < > = values in this interval not displayed.     Recent Results (from the past 240 hour(s))  Culture, Respiratory w Gram Stain     Status:  None   Collection Time: 06/27/22  1:26 PM   Specimen: Bronchoalveolar Lavage; Respiratory  Result Value Ref Range Status   Specimen Description   Final    BRONCHIAL ALVEOLAR LAVAGE Performed at [facility] [address], [place], [place] [address]    Special Requests   Final    NONE Performed at [facility], [place] [address], [place], [place] [address]    Gram Stain   Final    NO WBC SEEN RARE BUDDING YEAST SEEN Performed at [facility], [place] [address], [place], [place] [address]    Culture FEW CANDIDA ALBICANS  Final   Report Status 06/30/2022 FINAL  Final     Radiology Studies: DG Chest Port 1 View  Result Date: 07/05/2022 CLINICAL DATA:  Acute respiratory distress EXAM: PORTABLE CHEST 1 VIEW COMPARISON:  07/02/2022 chest radiograph. FINDINGS: Intact sternotomy wires. CABG clips overlie the mediastinum. Stable cardiomediastinal silhouette with top-normal heart size. No pneumothorax. Stable volume loss in the right hemithorax and dense upper right lung consolidation. Stable patchy bibasilar lung opacities. Stable mild blunting of the bilateral costophrenic angles. IMPRESSION: 1. Stable volume loss in the right hemithorax and dense upper right lung consolidation. Stable patchy bibasilar lung opacities, favor atelectasis. Findings favor multilobar pneumonia as described on recent chest CT. 2. Stable mild blunting of the bilateral costophrenic angles, cannot exclude small bilateral pleural effusions. Electronically Signed   By: Ilona Sorrel M.D.   On: 07/05/2022 10:10   ECHOCARDIOGRAM COMPLETE  Result Date: 07/03/2022    ECHOCARDIOGRAM REPORT   Patient Name:    Paul Sandoval. Date of Exam: 07/03/2022 Medical Rec #:  [id number]            Height:       70.0 in Accession #:    [id number]           Weight:       155.2 lb Date of Birth:  [birth year]             BSA:          1.874 m Patient Age:    13 years             BP:           113/99 mmHg Patient Gender: M                    HR:           96 bpm. Exam Location:  Inpatient Procedure: 2D Echo Indications:    acute respiratory distress  History:        Patient has prior history of Echocardiogram examinations, most                 recent 03/28/2022. Risk Factors:Hypertension and Dyslipidemia.  Sonographer:    Harvie Junior Referring Phys: [phone] MURALI RAMASWAMY  Sonographer Comments: Technically difficult study due to poor echo windows. IMPRESSIONS  1. Very poor quality images despite attempted Definity administration. The entire apex appears dyskinetic. Left ventricular ejection fraction, by estimation, is 30 to 35%. The left ventricle has moderately decreased function. Left ventricular endocardial border not optimally defined to evaluate regional wall motion. Left ventricular diastolic parameters are consistent with Grade I diastolic dysfunction (impaired relaxation).  2. Right ventricular systolic function is moderately reduced. The right ventricular size is normal.  3. The mitral valve is normal in structure. Trivial mitral valve regurgitation.  4. The aortic valve is tricuspid. There  is mild calcification of the aortic valve. Aortic valve regurgitation is not visualized. Aortic valve sclerosis/calcification is present, without any evidence of aortic stenosis. Comparison(s): No significant change from prior study. Prior images reviewed side by side. FINDINGS  Left Ventricle: Very poor quality images despite attempted Definity administration. The entire apex appears dyskinetic. Left ventricular ejection fraction, by estimation, is 30 to 35%. The left ventricle has moderately decreased function. Left ventricular endocardial  border not optimally defined to evaluate regional wall motion. The left ventricular internal cavity size was normal in size. There is no left ventricular hypertrophy. Left ventricular diastolic parameters are consistent with Grade  I diastolic dysfunction (impaired relaxation). Normal left ventricular filling pressure. Right Ventricle: The right ventricular size is normal. Right vetricular wall thickness was not well visualized. Right ventricular systolic function is moderately reduced. Left Atrium: Left atrial size was normal in size. Right Atrium: Right atrial size was normal in size. Pericardium: There is no evidence of pericardial effusion. Mitral Valve: The mitral valve is normal in structure. Trivial mitral valve regurgitation. Tricuspid Valve: The tricuspid valve is grossly normal. Tricuspid valve regurgitation is not demonstrated. Aortic Valve: The aortic valve is tricuspid. There is mild calcification of the aortic valve. Aortic valve regurgitation is not visualized. Aortic valve sclerosis/calcification is present, without any evidence of aortic stenosis. Aortic valve mean gradient measures 3.0 mmHg. Aortic valve peak gradient measures 4.4 mmHg. Aortic valve area, by VTI measures 2.79 cm. Pulmonic Valve: The pulmonic valve was not well visualized. Aorta: The aortic root is normal in size and structure. IAS/Shunts: The interatrial septum was not well visualized.  LEFT VENTRICLE PLAX 2D LVIDd:         5.10 cm      Diastology LVIDs:         3.80 cm      LV e' medial:    5.55 cm/s LV PW:         0.80 cm      LV E/e' medial:  10.3 LV IVS:        0.80 cm      LV e' lateral:   5.55 cm/s LVOT diam:     2.10 cm      LV E/e' lateral: 10.3 LV SV:         51 LV SV Index:   27 LVOT Area:     3.46 cm  LV Volumes (MOD) LV vol d, MOD A2C: 115.0 ml LV vol d, MOD A4C: 170.0 ml LV vol s, MOD A2C: 83.2 ml LV vol s, MOD A4C: 108.0 ml LV SV MOD A2C:     31.8 ml LV SV MOD A4C:     170.0 ml LV SV MOD BP:      53.7 ml RIGHT  VENTRICLE RV Basal diam:  3.90 cm RV Mid diam:    3.60 cm TAPSE (M-mode): 1.9 cm LEFT ATRIUM           Index        RIGHT ATRIUM           Index LA diam:      2.90 cm 1.55 cm/m   RA Area:     12.70 cm LA Vol (A2C): 33.5 ml 17.88 ml/m  RA Volume:   30.80 ml  16.44 ml/m LA Vol (A4C): 40.4 ml 21.56 ml/m  AORTIC VALVE                    PULMONIC VALVE AV Area (Vmax):  2.54 cm     PV Vmax:       0.65 m/s AV Area (Vmean):   2.41 cm     PV Peak grad:  1.7 mmHg AV Area (VTI):     2.79 cm AV Vmax:           105.00 cm/s AV Vmean:          74.700 cm/s AV VTI:            0.181 m AV Peak Grad:      4.4 mmHg AV Mean Grad:      3.0 mmHg LVOT Vmax:         77.00 cm/s LVOT Vmean:        52.000 cm/s LVOT VTI:          0.146 m LVOT/AV VTI ratio: 0.81  AORTA Ao Root diam: 3.40 cm MITRAL VALVE MV Area (PHT): 3.65 cm    SHUNTS MV Decel Time: 208 msec    Systemic VTI:  0.15 m MR Peak grad: 14.1 mmHg    Systemic Diam: 2.10 cm MR Vmax:      188.00 cm/s MV E velocity: 57.40 cm/s MV A velocity: 84.40 cm/s MV E/A ratio:  0.68 Mihai Croitoru MD Electronically signed by Sanda Klein MD Signature Date/Time: 07/03/2022/4:22:02 PM    Final     Scheduled Meds:  arformoterol  15 mcg Nebulization BID   budesonide (PULMICORT) nebulizer solution  0.5 mg Nebulization BID   Chlorhexidine Gluconate Cloth  6 each Topical Daily   docusate  100 mg Oral BID   feeding supplement  237 mL Oral TID BM   fluconazole  100 mg Oral Daily   furosemide  60 mg Intravenous BID   Gerhardt's butt cream   Topical TID   guaiFENesin  15 mL Oral Q12H   insulin aspart  0-9 Units Subcutaneous Q4H   methylPREDNISolone (SOLU-MEDROL) injection  60 mg Intravenous Daily   nystatin  5 mL Oral QID   pantoprazole (PROTONIX) IV  40 mg Intravenous Q12H   polyethylene glycol  17 g Oral Daily   revefenacin  175 mcg Nebulization Daily   rosuvastatin  20 mg Oral QHS   sodium chloride HYPERTONIC  4 mL Nebulization Daily   Continuous Infusions:  sodium chloride  Stopped (07/13/2022 1222)     LOS: 16 days   Alma Friendly, MD [facility] P11/04/2022, 1:52 PM

## 2022-07-05 NOTE — Progress Notes (Signed)
Called to bedside of PT, Rapid response and RN present.  Pt in chair with inc WOB, HR 134, RR36, SPO2 78%.  Placed pt on NRB SPO2 up to 92%.  Gave pt nebulizers, placed patient back in bed.  Pt HR 120, RR 28, SPO2 90%.  Unable to perform CPT due to inc HR and anxiety.  PA is aware.  CXR performed awaiting results.  Pt to be moved to unit.

## 2022-07-06 ENCOUNTER — Other Ambulatory Visit: Payer: Self-pay

## 2022-07-06 ENCOUNTER — Inpatient Hospital Stay (HOSPITAL_COMMUNITY): Payer: Medicare Other

## 2022-07-06 DIAGNOSIS — I5043 Acute on chronic combined systolic (congestive) and diastolic (congestive) heart failure: Secondary | ICD-10-CM | POA: Diagnosis not present

## 2022-07-06 DIAGNOSIS — Z79899 Other long term (current) drug therapy: Secondary | ICD-10-CM

## 2022-07-06 DIAGNOSIS — J9601 Acute respiratory failure with hypoxia: Secondary | ICD-10-CM | POA: Diagnosis not present

## 2022-07-06 DIAGNOSIS — Z7189 Other specified counseling: Secondary | ICD-10-CM

## 2022-07-06 DIAGNOSIS — J9621 Acute and chronic respiratory failure with hypoxia: Secondary | ICD-10-CM | POA: Diagnosis not present

## 2022-07-06 DIAGNOSIS — R0602 Shortness of breath: Principal | ICD-10-CM

## 2022-07-06 DIAGNOSIS — Z515 Encounter for palliative care: Secondary | ICD-10-CM | POA: Diagnosis not present

## 2022-07-06 DIAGNOSIS — Z66 Do not resuscitate: Secondary | ICD-10-CM

## 2022-07-06 LAB — BASIC METABOLIC PANEL
Anion gap: 10 (ref 5–15)
BUN: 45 mg/dL — ABNORMAL HIGH (ref 8–23)
CO2: 40 mmol/L — ABNORMAL HIGH (ref 22–32)
Calcium: 8.1 mg/dL — ABNORMAL LOW (ref 8.9–10.3)
Chloride: 95 mmol/L — ABNORMAL LOW (ref 98–111)
Creatinine, Ser: 2.07 mg/dL — ABNORMAL HIGH (ref 0.61–1.24)
GFR, Estimated: 31 mL/min — ABNORMAL LOW (ref >60)
Glucose, Bld: 133 mg/dL — ABNORMAL HIGH (ref 70–99)
Potassium: 3.6 mmol/L (ref 3.5–5.1)
Sodium: 145 mmol/L (ref 135–145)

## 2022-07-06 LAB — GLUCOSE, CAPILLARY
Glucose-Capillary: 110 mg/dL — ABNORMAL HIGH (ref 70–99)
Glucose-Capillary: 121 mg/dL — ABNORMAL HIGH (ref 70–99)
Glucose-Capillary: 131 mg/dL — ABNORMAL HIGH (ref 70–99)
Glucose-Capillary: 134 mg/dL — ABNORMAL HIGH (ref 70–99)
Glucose-Capillary: 71 mg/dL (ref 70–99)
Glucose-Capillary: 99 mg/dL (ref 70–99)

## 2022-07-06 LAB — CBC WITH DIFFERENTIAL/PLATELET
Abs Immature Granulocytes: 0.06 10*3/uL (ref 0.00–0.07)
Basophils Absolute: 0 10*3/uL (ref 0.0–0.1)
Basophils Relative: 0 %
Eosinophils Absolute: 0 10*3/uL (ref 0.0–0.5)
Eosinophils Relative: 0 %
HCT: 30 % — ABNORMAL LOW (ref 39.0–52.0)
Hemoglobin: 8.8 g/dL — ABNORMAL LOW (ref 13.0–17.0)
Immature Granulocytes: 1 %
Lymphocytes Relative: 2 %
Lymphs Abs: 0.2 10*3/uL — ABNORMAL LOW (ref 0.7–4.0)
MCH: 26.6 pg (ref 26.0–34.0)
MCHC: 29.3 g/dL — ABNORMAL LOW (ref 30.0–36.0)
MCV: 90.6 fL (ref 80.0–100.0)
Monocytes Absolute: 0.1 10*3/uL (ref 0.1–1.0)
Monocytes Relative: 1 %
Neutro Abs: 9.5 10*3/uL — ABNORMAL HIGH (ref 1.7–7.7)
Neutrophils Relative %: 96 %
Platelets: 86 10*3/uL — ABNORMAL LOW (ref 150–400)
RBC: 3.31 MIL/uL — ABNORMAL LOW (ref 4.22–5.81)
RDW: 16.8 % — ABNORMAL HIGH (ref 11.5–15.5)
WBC: 9.9 10*3/uL (ref 4.0–10.5)
nRBC: 0 % (ref 0.0–0.2)

## 2022-07-06 LAB — COMPREHENSIVE METABOLIC PANEL
ALT: 28 U/L (ref 0–44)
AST: 30 U/L (ref 15–41)
Albumin: 1.9 g/dL — ABNORMAL LOW (ref 3.5–5.0)
Alkaline Phosphatase: 89 U/L (ref 38–126)
Anion gap: 11 (ref 5–15)
BUN: 41 mg/dL — ABNORMAL HIGH (ref 8–23)
CO2: 42 mmol/L — ABNORMAL HIGH (ref 22–32)
Calcium: 8 mg/dL — ABNORMAL LOW (ref 8.9–10.3)
Chloride: 93 mmol/L — ABNORMAL LOW (ref 98–111)
Creatinine, Ser: 2.07 mg/dL — ABNORMAL HIGH (ref 0.61–1.24)
GFR, Estimated: 31 mL/min — ABNORMAL LOW (ref >60)
Glucose, Bld: 119 mg/dL — ABNORMAL HIGH (ref 70–99)
Potassium: 2.8 mmol/L — ABNORMAL LOW (ref 3.5–5.1)
Sodium: 146 mmol/L — ABNORMAL HIGH (ref 135–145)
Total Bilirubin: 0.6 mg/dL (ref 0.3–1.2)
Total Protein: 6.1 g/dL — ABNORMAL LOW (ref 6.5–8.1)

## 2022-07-06 LAB — MAGNESIUM
Magnesium: 2.3 mg/dL (ref 1.7–2.4)
Magnesium: 2.2 mg/dL (ref 1.7–2.4)

## 2022-07-06 LAB — D-DIMER, QUANTITATIVE: D-Dimer, Quant: 6.05 ug/mL-FEU — ABNORMAL HIGH (ref 0.00–0.50)

## 2022-07-06 LAB — TROPONIN I (HIGH SENSITIVITY)
Troponin I (High Sensitivity): 1193 ng/L (ref <18)
Troponin I (High Sensitivity): 970 ng/L (ref <18)

## 2022-07-06 MED ORDER — CARVEDILOL 3.125 MG PO TABS: 3.1250 mg | ORAL_TABLET | Freq: Two times a day (BID) | ORAL | Status: DC

## 2022-07-06 MED ORDER — DIPHENHYDRAMINE HCL 12.5 MG/5ML PO ELIX: 12.5000 mg | ORAL_SOLUTION | Freq: Four times a day (QID) | ORAL | Status: DC | PRN

## 2022-07-06 MED ORDER — DIPHENHYDRAMINE HCL 50 MG/ML IJ SOLN
12.5000 mg | Freq: Four times a day (QID) | INTRAMUSCULAR | Status: DC | PRN
  Administered 2022-07-07: 12.5 mg via INTRAVENOUS
  Filled 2022-07-06: qty 1

## 2022-07-06 MED ORDER — POTASSIUM CHLORIDE CRYS ER 20 MEQ PO TBCR
40.0000 meq | EXTENDED_RELEASE_TABLET | Freq: Once | ORAL | Status: AC
  Administered 2022-07-06: 40 meq via ORAL
  Filled 2022-07-06: qty 2

## 2022-07-06 MED ORDER — METOPROLOL TARTRATE 12.5 MG HALF TABLET
12.5000 mg | ORAL_TABLET | Freq: Four times a day (QID) | ORAL | Status: DC
  Administered 2022-07-06 – 2022-07-07 (×3): 12.5 mg via ORAL
  Filled 2022-07-06 (×3): qty 1

## 2022-07-06 MED ORDER — HYDROMORPHONE HCL 1 MG/ML IJ SOLN: 0.2000 mg | INTRAMUSCULAR | Status: DC | PRN

## 2022-07-06 MED ORDER — FUROSEMIDE 10 MG/ML IJ SOLN
40.0000 mg | Freq: Every day | INTRAMUSCULAR | Status: DC
  Administered 2022-07-06 – 2022-07-07 (×2): 40 mg via INTRAVENOUS
  Filled 2022-07-06 (×2): qty 4

## 2022-07-06 MED ORDER — HEPARIN BOLUS VIA INFUSION
4000.0000 [IU] | Freq: Once | INTRAVENOUS | Status: DC
  Filled 2022-07-06: qty 4000

## 2022-07-06 MED ORDER — ONDANSETRON HCL 4 MG/2ML IJ SOLN: 4.0000 mg | Freq: Four times a day (QID) | INTRAMUSCULAR | Status: DC | PRN

## 2022-07-06 MED ORDER — POTASSIUM CHLORIDE 10 MEQ/100ML IV SOLN
INTRAVENOUS | Status: AC
  Filled 2022-07-06: qty 100

## 2022-07-06 MED ORDER — POTASSIUM CHLORIDE 10 MEQ/100ML IV SOLN
10.0000 meq | INTRAVENOUS | Status: AC
  Administered 2022-07-06 (×6): 10 meq via INTRAVENOUS
  Filled 2022-07-06 (×6): qty 100

## 2022-07-06 MED ORDER — SODIUM CHLORIDE 0.9% FLUSH: 9.0000 mL | INTRAVENOUS | Status: DC | PRN

## 2022-07-06 MED ORDER — HYDROMORPHONE BOLUS VIA INFUSION: 0.2000 mg | INTRAVENOUS | Status: DC | PRN

## 2022-07-06 MED ORDER — HYDROMORPHONE 1 MG/ML IV SOLN
INTRAVENOUS | Status: DC
  Administered 2022-07-06 – 2022-07-07 (×2): 0.2 mg via INTRAVENOUS
  Filled 2022-07-06: qty 30

## 2022-07-06 MED ORDER — NALOXONE HCL 0.4 MG/ML IJ SOLN: 0.4000 mg | INTRAMUSCULAR | Status: DC | PRN

## 2022-07-06 MED ORDER — HEPARIN (PORCINE) 25000 UT/250ML-% IV SOLN: 800.0000 [IU]/h | INTRAVENOUS | Status: DC

## 2022-07-06 NOTE — TOC Progression Note (Signed)
Transition of Care (TOC) - Progression Note    Patient Details  Name: Anthony Tamburo. MRN: [id number] Date of Birth: 1940/09/02  Transition of Care [facility]) CM/SW Contact  Leeroy Cha, RN Phone Number: 07/06/2022, 7:22 AM  Clinical Narrative:    [id number]/cheart reivewed for toc needs.  Following for changes in care and condition.   Expected Discharge Plan: Home/Self Care Barriers to Discharge: Continued Medical Work up  Expected Discharge Plan and Services Expected Discharge Plan: Home/Self Care   Discharge Planning Services: CM Consult Post Acute Care Choice: NA Living arrangements for the past 2 months: Single Family Home                 DME Arranged: Patient refused services                     Social Determinants of Health (SDOH) Interventions    Readmission Risk Interventions   Row Labels 06/25/2022   11:26 AM  Readmission Risk Prevention Plan   Section Header. No data exists in this row.   Medication Review Press photographer)   Complete  PCP or Specialist appointment within 3-5 days of discharge   Complete  HRI or Home Care Consult   Complete  SW Recovery Care/Counseling Consult   Complete  Palliative Care Screening   Not Applicable

## 2022-07-06 NOTE — Progress Notes (Signed)
Per MD holding off on CT with contrast to see if kidney function improves

## 2022-07-06 NOTE — Progress Notes (Signed)
Monitor alarmed V-tach. This RN immediately to bedside. Patient A/Ox4. Patient asymptomatic. This RN notified CCM and [facility] hospitalist [place], MD of 28 beat run of vtach.

## 2022-07-06 NOTE — Progress Notes (Signed)
PT refused BiPAP 

## 2022-07-06 NOTE — Progress Notes (Signed)
BLE venous duplex has been completed.   Results can be found under chart review under CV PROC. 07/06/2022 3:19 PM Munir Victorian RVT, RDMS

## 2022-07-06 NOTE — Progress Notes (Signed)
Daily Progress Note   Patient [name].       Date: 07/06/2022 DOB: 1941-02-29  Age: 81 y.o. MRN#: [id number] Attending Physician: Alma Friendly, MD Primary Care Physician: Coral Spikes, DO Admit Date: 05/30/2022 Length of Stay: 17 days  Reason for Consultation/Follow-up: Establishing goals of care and symptom management  Subjective:   CC: Patient still with increased work of breathing.  Following up regarding complex medical decision making and symptom management.  Subjective:  Discussed care with primary hospitalist prior to seeing patient.  Presented to bedside to see patient today.  No family at bedside during visit.  Able to reintroduce myself as a member of the palliative care team.  Patient remembered speaking with me yesterday.  Inquired how patient was feeling today and he noted that he is coughing and spitting up a lot of mucus.  He also admitted to feeling short of breath and some anxiety associated with that.  We discussed the idea of starting a bolus only Dilaudid PCA to assist with management of dyspnea that may improve anxiety since his anxiety is associated with shortness of breath.  Discussed in detail how a PCA would work and that the patient would be in control of the medication.  Explained safety related to PCAs including that as long as the patient is the only person pushing the administrative button, if the medication begins to be too strong it will first make him sleepy which means that he cannot push the button, the medication will wear off, and so it is a very safe mechanism for opioid distribution.  Also if needed, infusion can be immediately stopped.  Patient willing to try this to assist with dyspnea management.  Noted if this does not work well for patient, can always try a different approach.  Patient agreeing to our PCA at this time. Only other concern was getting more limp ointment for his dry lips.  Noted would inform RN of this.  All  questions answered at that time.  Review of Systems Patient reporting continued sensation of dyspnea associated with anxiety. Objective:   Vital Signs:  BP (!) 104/39   Pulse (!) 116   Temp 98 F (36.7 C) (Axillary)   Resp (!) 24   Ht 5' 10" (1.778 m)   Wt 68.2 kg   SpO2 92%   BMI 21.57 kg/m   Physical Exam: General: NAD, alert, voice is hoarse though able to participate in conversation still, chronically ill-appearing Eyes: conjunctiva clear, anicteric sclera HENT: Dry mucous membranes, cracked lips Cardiovascular: Tachycardia noted Respiratory: Increased work of breathing noted, on high flow via nasal cannula  Abdomen: not distended Extremities: Able to move all extremities though weak Skin: no rashes or lesions on visible skin Neuro: A&Ox4, following commands easily Psych: appropriately answers all questions  Assessment & Plan:   Assessment: Patient  Rush Landmark") is an 81 year old male with a past medical history of COPD, chronic bronchitis, tobacco use, 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  Patient had recently been admitted and had just been discharged from hospital on 06/07/2022.  During this prolonged hospitalization, patient has required intubation for bronchoscopy which showed yeast present in the lungs and mucous plugging.  Imaging has shown multifocal pneumonia.  Patient was transferred back to ICU on 07/05/2022 for worsening of his respiratory status. Palliative care consulted to assist  with complex medical decision-making.  Of note, this provider had met patient during prior hospitalization and was known with patient and his wife.   Recommendations/Plan: # Complex medical decision making/goals of care:  - Patient continues to have discussions with medical providers regarding plans for medical care moving forward.   Currently continuing with aggressive medical therapies and attempts to improve respiratory illness.  - Patient has stated previously that should he be unable to make medical decisions for himself, he would want his wife to make medical decisions for him.  Patient has confirmed this again.  -  Code Status: DNR  # Symptom management:  -Dyspnea, in the setting of acute on chronic respiratory failure which is multifactorial from pneumonia, COPD, CHF exacerbation; requiring high flow nasal cannula support   -Starting IV Dilaudid bolus only PCA at 0.2 mg every 15 minutes as needed.  Not starting continuous dosing at this time as per patient's wishes though did discuss with him this may need to be considered moving forward if he does have a good response to the medication to allow for improved respiratory effort.  # Psychosocial Support:  -Wife, son  # Discharge Planning: TBD  Discussed with: Bedside RN, hospitalist, patient  Thank you for allowing the palliative care team to participate in the care [place].Chelsea Aus, DO Palliative Care Provider PMT # [phone]  If patient remains symptomatic despite maximum doses, please call PMT at [phone] between 0700 and 1900. Outside of these hours, please call attending, as PMT does not have night coverage.

## 2022-07-06 NOTE — Progress Notes (Signed)
[facility] Progress Note Patient [name]. DOB: May 23, 1941 MRN: [id number]   Date of Service  07/06/2022  HPI/Events of Note  Hypokalemia - K+ = 2.8 and Creatinine = 2.07.   eICU Interventions  Will replace K+.      Intervention Category Major Interventions: Electrolyte abnormality - evaluation and management  Jaynell Castagnola [place] 07/06/2022, 4:58 AM

## 2022-07-06 NOTE — Progress Notes (Signed)
Pt let  me do CPT but doesn't want to wear bipap. Machine remained bedside. Pt is tolerating HHFNC well at this time.

## 2022-07-06 NOTE — Progress Notes (Signed)
[facility] for IV heparin Indication: chest pain/ACS  Allergies  Allergen Reactions   Neomycin Hives, Itching and Rash   Anoro Ellipta [Umeclidinium-Vilanterol] Itching and Rash    Rash over face, neck   Cetirizine & Related Rash   Stiolto Respimat [Tiotropium Bromide-Olodaterol] Itching and Rash    Rash over face , neck    Patient Measurements: Height: 5\' 10"  (177.8 cm) Weight: 68.2 kg (150 lb 5.7 oz) IBW/kg (Calculated) : 73 Heparin Dosing Weight: 68.2 kg   Vital Signs: Temp: 97.4 F (36.3 C) (11/10 1222) Temp Source: Oral (11/10 1222) BP: 107/42 (11/10 1600) Pulse Rate: 116 (11/10 1600)  Labs: Recent Labs    07/04/22 0455 07/05/22 0358 07/06/22 0240 07/06/22 1440  HGB 8.5* 8.9* 8.8*  --   HCT 29.3* 30.0* 30.0*  --   PLT 77* 74* 86*  --   CREATININE 1.77* 1.73* 2.07* 2.07*  TROPONINIHS  --   --   --  1,193*    Estimated Creatinine Clearance: 26.5 mL/min (A) (by C-G formula based on SCr of 2.07 mg/dL (H)).   Medications:  Scheduled:   arformoterol  15 mcg Nebulization BID   Chlorhexidine Gluconate Cloth  6 each Topical Daily   docusate  100 mg Oral BID   feeding supplement  237 mL Oral TID BM   fluconazole  100 mg Oral Daily   furosemide  40 mg Intravenous Daily   Gerhardt's butt cream   Topical TID   guaiFENesin  15 mL Oral Q12H   HYDROmorphone   Intravenous Q4H   insulin aspart  0-9 Units Subcutaneous Q4H   metoprolol tartrate  12.5 mg Oral Q6H   nystatin  5 mL Oral QID   pantoprazole (PROTONIX) IV  40 mg Intravenous Q12H   polyethylene glycol  17 g Oral Daily   revefenacin  175 mcg Nebulization Daily   rosuvastatin  20 mg Oral QHS   sodium chloride HYPERTONIC  4 mL Nebulization Daily    Assessment: Pharmacy is consulted to dose heparin drip in 81 yo male with ACS. Previous concern for GI bleed but evaluated by GI and no further recommendations other than pantoprazole 40 mg BID   Today, 07/06/22  Hgb 8.8 low  but has been stable Plt 88, low trending up  Scr 2.07 ng/dl, CrCl 27 ml/min   Goal of Therapy:  Heparin level 0.3-0.7 units/ml Monitor platelets by anticoagulation protocol: Yes   Plan: Heparin 4000 unit bolus followed by heparin 800 units/hr  Obtain HL 8 hours after start of infusion Daily CBC, HL Monitor for signs and symptoms of bleeding    Royetta Asal, PharmD, BCPS 07/06/2022 5:09 PM

## 2022-07-06 NOTE — Plan of Care (Signed)
  Problem: Education: Goal: Ability to demonstrate management of disease process will improve Outcome: Progressing Goal: Ability to verbalize understanding of medication therapies will improve Outcome: Progressing   Problem: Activity: Goal: Capacity to carry out activities will improve Outcome: Progressing   Problem: Cardiac: Goal: Ability to achieve and maintain adequate cardiopulmonary perfusion will improve Outcome: Progressing   Problem: Education: Goal: Knowledge of General Education information will improve Description: Including pain rating scale, medication(s)/side effects and non-pharmacologic comfort measures Outcome: Progressing   Problem: Health Behavior/Discharge Planning: Goal: Ability to manage health-related needs will improve Outcome: Progressing   Problem: Clinical Measurements: Goal: Ability to maintain clinical measurements within normal limits will improve Outcome: Progressing Goal: Will remain free from infection Outcome: Progressing Goal: Diagnostic test results will improve Outcome: Progressing Goal: Respiratory complications will improve Outcome: Progressing Goal: Cardiovascular complication will be avoided Outcome: Progressing   Problem: Activity: Goal: Risk for activity intolerance will decrease Outcome: Progressing   Problem: Nutrition: Goal: Adequate nutrition will be maintained Outcome: Progressing   Problem: Coping: Goal: Level of anxiety will decrease Outcome: Progressing   Problem: Elimination: Goal: Will not experience complications related to bowel motility Outcome: Progressing Goal: Will not experience complications related to urinary retention Outcome: Progressing   Problem: Pain Managment: Goal: General experience of comfort will improve Outcome: Progressing   Problem: Safety: Goal: Ability to remain free from injury will improve Outcome: Progressing   Problem: Skin Integrity: Goal: Risk for impaired skin integrity  will decrease Outcome: Progressing   Problem: Education: Goal: Knowledge of risk factors and measures for prevention of condition will improve Outcome: Progressing   Problem: Coping: Goal: Psychosocial and spiritual needs will be supported Outcome: Progressing   Problem: Respiratory: Goal: Will maintain a patent airway Outcome: Progressing Goal: Complications related to the disease process, condition or treatment will be avoided or minimized Outcome: Progressing   Problem: Activity: Goal: Ability to tolerate increased activity will improve Outcome: Progressing   Problem: Respiratory: Goal: Ability to maintain a clear airway and adequate ventilation will improve Outcome: Progressing   Problem: Role Relationship: Goal: Method of communication will improve Outcome: Progressing   Problem: Education: Goal: Ability to describe self-care measures that may prevent or decrease complications (Diabetes Survival Skills Education) will improve Outcome: Progressing Goal: Individualized Educational Video(s) Outcome: Progressing   Problem: Coping: Goal: Ability to adjust to condition or change in health will improve Outcome: Progressing   Problem: Fluid Volume: Goal: Ability to maintain a balanced intake and output will improve Outcome: Progressing   Problem: Health Behavior/Discharge Planning: Goal: Ability to identify and utilize available resources and services will improve Outcome: Progressing Goal: Ability to manage health-related needs will improve Outcome: Progressing   Problem: Metabolic: Goal: Ability to maintain appropriate glucose levels will improve Outcome: Progressing   Problem: Nutritional: Goal: Maintenance of adequate nutrition will improve Outcome: Progressing Goal: Progress toward achieving an optimal weight will improve Outcome: Progressing   Problem: Skin Integrity: Goal: Risk for impaired skin integrity will decrease Outcome: Progressing   Problem:  Tissue Perfusion: Goal: Adequacy of tissue perfusion will improve Outcome: Progressing

## 2022-07-06 NOTE — Plan of Care (Signed)
Patient with 28 beat run of Vtach. Denies chest pain. EKG with Twave inversion and noted Right bundle branch block. Troponin 1193. Crt 2.07. K 3.6. Mag 2.2. Spoke with Cardiology. Recommend trending troponin. If continues to rise, consider heparin gtt for 48 hours.

## 2022-07-06 NOTE — Progress Notes (Signed)
NAME:  Evelyn Moch., MRN:  [id number], DOB:  [birth year], LOS: 35 ADMISSION DATE:  06/22/2022, CONSULTATION DATE:  10/26 REFERRING MD:  Dr. Cyndia Skeeters, [facility] CHIEF COMPLAINT:  Dyspnea   History of Present Illness:  81 y/o male admitted with acute respiratory failure with hypoxemia in the setting of COVID 19  pneumonia.  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  He is followed by Dr. Elsworth Soho in the pulmonary office.  Pertinent  Medical History  Former smoker, COPD, Chronic HFrEF (03/2022> EF 30-35%/ G1DD/ mildly reduced RVSF)/ HFpEF, HLD, CKD 3b, anemia of chronic disease; stage IIIa NSCLC RUL treated with chemo/rad 2017  Significant Hospital Events: Including procedures, antibiotic start and stop dates in addition to other pertinent events   03/28/22 baseline ECHO LVEF 35% - same since 2021 (ws 20-25% in 2019 and 2018) 10/23 Admitted to [facility] - PCR POSITIVe CRP 3.4 10/24 D-dimer 2.5 10/25 Increased O2 need to heated high flow. PCCM consulted 10/26 attempted right thoracentesis, no fluid obtained LE Duplex - neg DVT 10/26 CT-PA > no PE.  Right lower lobe consolidation with mucous, obstructing material right lower lobe and right middle lobe bronchi.  Left lower lobe and lingular airspace disease.  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  Right basilar pleural 10.8 cm fluid collection 10/28 worse infiltrates on CXR  10/30 solumedrol added, lasix to 62m BID  11/01  INTUBATED cont on unasyn  D-dimer 17.6 CRP 10.8 BRONCH BAL - CANDIDA 11/2 - Duplex LE - negative DVT 11/03 extubated 11/5 - C/o hoarseness.  Has dry cough.  Still on HFNC 07/02/22 - Few canddai ain BAL from 07/02/2022. Afebrile. Normal WBC. On 9L HFNC. RNsays easy desats when OOB to 80s and needs 15L [place]. Voice stil very hoarse.  QTC < 5044mc and [facility] started Diflucan for Candida in BAL ENT consult -for hoarsenss - no scope but being  monitored Unasyn stopped (2 week course) SEd rate 119/CRP 11 BNP 1100. Trop 177 ->126 D-dimer better 6.96 11/7 BNP> 1100, Trop 177-> 126 x 24h. REpeat ECHO pending. Only did BiPAP x 45 min last night and could not tolerate. sTill on 15L [place] and pulse 97% 11/9> transferred to SDU for worsening hypoxia> HHFNC  Interim History / Subjective:  Yesterday with decision for DNR/DNI. This AM on heated high flow 70%/40L.   Objective   Blood pressure (!) 120/50, pulse 81, temperature 98 F (36.7 C), temperature source Axillary, resp. rate (!) 23, height _0  (1.778 m), weight 68.2 kg, SpO2 92 %.    FiO2 (%):  [70 %-100 %] 70 %   Intake/Output Summary (Last 24 hours) at 07/06/2022 1017 Last data filed at 07/06/2022 0825 Gross per 24 hour  Intake 851.67 ml  Output 850 ml  Net 1.67 ml   Filed Weights   07/04/22 0500 07/05/22 0500 07/06/22 0500  Weight: 68.6 kg 68.5 kg 68.2 kg    Examination: General:  chronically ill and deconditioned elderly male lying in bed  HEENT: Dry MM, hoarseness  Neuro:  alert, oriented, follows commands  CV: RRR, HR 88, no mRG PULM: diminished breath sounds, mild tachypnea  GI: soft, active bowel sounds, non-tender, non-distended  Extremities: warm/dry, no LE edema, TED stockings in place  Resolved Hospital Problem list     Assessment & Plan:   Acute on chronic hypoxic/hypercapnic respiratory failure from COPD/asthma exacerbation, COVID positive from 06/10/2022, and multifocal pneumonia,  Candida on  BAL 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 Hx of Rt upper long lung cancer Concern for Dysphagia - extubated 11/3 - slow failure to improve despite daily lasix and Solumedroil 48m IV daily.  Labs indicating both inflammatory and s-chf component.  Unasyn stopped 11/6.  Empiric diflucan started 11/8 - overall slow/poor progression since extubation on 11/3 despite diuresis, solumedrol, abx - Echo 07/03/22> poor  windows  LV 30-35, G1DD, moderately reduced RVSF - steroids stopped 11/9  Plan:  - cont HHFNC and wean as tolerated for oxygen saturation goal >88.  Currently on 40L/ 70%  - NPO, suspect aspiration - when respiratory status more stable, diet per SLP recs> needs MBS - Patient continues to refuse BiPAP and CPT  - continue 7 day course of diflucan, started 11/6 - continued diureses as tolerated - nebs> cont brovanna, yupelri and prn albuterol  - cont HTS nebs for 3 days, guaifenesin and continue to try CPT, however patient has been refusing - VTE ppx/ lovenox stopped 11/7 due to thrombocytopenia and black stools.  But decompensation happened after moving to recliner and has had persistent tachycardia (low 100's) and increased O2 needs since.   If not improving in O2 needs, consider PE, low suspicion.  CTA PE neg 10/26.   Recheck LE dopplers.  (Pending)  - if further decompensates, recommend transition to comfort focused care.  Expect some exertional desaturations but should recover with rest and increased O2 support during exertion  Hoarseness. - likely related to COVID and intubation - ENT consul ton 11/7 > no intervention. Will re-evaluation next week.   Remainder per primary team.    Chronic HFrEF. RBBB. HTN, HLD, CAD. CKD 3b. Urine retention. Anemia of critical illness and chronic disease. Thrombocytopenia in setting of critical illness. GERD.     CCT 32 mins  KHayden Pedro AGACNP-BC [facility]  Pgr: [phone] PCCM Pgr: [phone] LABS    PULMONARY  Latest Reference Range & Units 06/19/22 04:10 06/20/22 04:08 06/21/22 04:10 06/22/22 02:55 06/23/22 02:52 06/24/22 02:50 06/25/22 03:00 06/27/22 02:49  D-Dimer, Quant 0.00 - 0.50 ug/mL-FEU 2.49 (H) 2.64 (H) 7.99 (H) 7.70 (H) 8.51 (H) 8.95 (H) 11.37 (H) 17.65 (H)  (H): Data is abnormally high   CBC Recent Labs  Lab 07/04/22 0455 07/05/22 0358 07/06/22 0240  HGB 8.5* 8.9* 8.8*  HCT  29.3* 30.0* 30.0*  WBC 8.6 8.4 9.9  PLT 77* 74* 86*    COAGULATION No results for input(s): "INR" in the last 168 hours.  CARDIAC  No results for input(s): "TROPONINI" in the last 168 hours. No results for input(s): "PROBNP" in the last 168 hours.   CHEMISTRY Recent Labs  Lab 07/02/22 0655 07/02/22 1234 07/03/22 0451 07/04/22 0455 07/05/22 0358 07/06/22 0240  NA 142  --  145 143 144 146*  K 3.4*  --  3.6 3.2* 3.2* 2.8*  CL 97*  --  102 94* 95* 93*  CO2 39*  --  35* 38* 39* 42*  GLUCOSE 135*  --  115* 104* 165* 119*  BUN 27*  --  34* 30* 34* 41*  CREATININE 1.51*  --  1.68* 1.77* 1.73* 2.07*  CALCIUM 7.8*  --  8.0* 8.0* 8.0* 8.0*  MG  --  2.1 2.3 2.2 2.1 2.3  PHOS  --  2.3* 3.7 2.4*  --   --    Estimated Creatinine Clearance: 26.5 mL/min (A) (by C-G formula based on SCr of  2.07 mg/dL (H)).   LIVER Recent Labs  Lab 07/04/22 0455 07/05/22 0358 07/06/22 0240  AST _0 ALT _1 ALKPHOS 86 86 89  BILITOT 0.7 0.6 0.6  PROT 5.5* 6.0* 6.1*  ALBUMIN 1.9* 1.9* 1.9*     INFECTIOUS Recent Labs  Lab 07/02/22 1234  LATICACIDVEN 2.1*  PROCALCITON 0.14     ENDOCRINE CBG (last 3)  Recent Labs    07/05/22 2325 07/06/22 0413 07/06/22 0813  GLUCAP 215* 99 110*    IMAGING x48h  - image(s) personally visualized  -   highlighted in bold DG Chest Port 1 View  Result Date: 07/05/2022 CLINICAL DATA:  Acute respiratory distress EXAM: PORTABLE CHEST 1 VIEW COMPARISON:  07/02/2022 chest radiograph. FINDINGS: Intact sternotomy wires. CABG clips overlie the mediastinum. Stable cardiomediastinal silhouette with top-normal heart size. No pneumothorax. Stable volume loss in the right hemithorax and dense upper right lung consolidation. Stable patchy bibasilar lung opacities. Stable mild blunting of the bilateral costophrenic angles. IMPRESSION: 1. Stable volume loss in the right hemithorax and dense upper right lung consolidation. Stable patchy bibasilar lung  opacities, favor atelectasis. Findings favor multilobar pneumonia as described on recent chest CT. 2. Stable mild blunting of the bilateral costophrenic angles, cannot exclude small bilateral pleural effusions. Electronically Signed   By: Ilona Sorrel M.D.   On: 07/05/2022 10:10

## 2022-07-06 NOTE — Progress Notes (Signed)
SLP Cancellation Note  Patient Details [name]. MRN: [id number] DOB: 1941-01-20   Cancelled treatment:       Reason Eval/Treat Not Completed: Pain limiting ability to participate (pt now on 40L of HFNC, currently NPO)  Kathleen Lime, MS [facility] SLP Acute Rehab Services Office [phone] Pager [phone]  Macario Golds 07/06/2022, 8:05 AM

## 2022-07-06 NOTE — Progress Notes (Signed)
PROGRESS NOTE  Jolee Ewing.  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  He was found to be  positive for COVID,found to be in acute hypoxic respiratory failure secondary to CHF exacerbation, pneumonia and COPD exacerbation.  He developed increased respiratory distress during this hospitalization and required to be transferred to [facility] service.  CTA chest showed multifocal pneumonia concerning for aspiration pneumonia, obstructive endobronchial material within the right lower lobe bronchus, near occlusive material in the right middle lobe bronchus, dense consolidation of the right lower lobe and left lower lobe.  Was intubated by ICU on 11/1, now extubated and transferred back to [facility] service on 11/4. Hospital course remarkable for persistent respiratory distress, cough, increased oxygen requirement. PCCM following  Significant events:  10/23 Admitted to [facility] 10/25 Increased O2 need to heated high flow. PCCM consulted 10/26 attempted right thoracentesis, no fluid obtained 10/26 CT-PA > no PE.  Right lower lobe consolidation with mucous, obstructing material right lower lobe and right middle lobe bronchi.  Left lower lobe and lingular airspace disease.  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  Right basilar pleural 10.8 cm fluid collection 10/28 worse infiltrates on CXR  10/30 solumedrol added, lasix to 62m BID  11/1 cont on unasyn. Intubated, bronch at bedside w mucus plugging 11/2 bronch w cryo, debulking of abnormal soft tissue occluding part of trachea,  RML RLL 11/3 extubated  [facility] Transferred to SDU due to worsening respiratory status requiring non-rebreather and heated high flow   Assessment & Plan:  Principal Problem:   Acute on chronic combined systolic and diastolic congestive heart failure (HCC) Active Problems:   HLD (hyperlipidemia)   Essential hypertension   GERD (gastroesophageal reflux disease)   CAD (coronary artery disease)   Thrombocytopenia (HCC)   Stage 3b [place] disease [place]CKD) [place]   Primary cancer of right upper lobe of lung (HCC)   Goals of care, counseling/discussion   Pleural effusion on right   Cardiomyopathy, ischemic   History of endovascular stent graft for abdominal aortic aneurysm (AAA)   Chronic obstructive pulmonary disease with acute exacerbation (HCC)   Acute respiratory failure with hypoxia (HCC)   Anemia of chronic kidney failure   COVID-19 virus infection   Aspiration pneumonia (HCC)   Urinary retention   Multifocal pneumonia   Hypotension due to drugs   Malnutrition of moderate degree   Acute on chronic respiratory failure with hypoxia and hypercapnia (HCC)   Pressure injury of skin   Palliative care encounter   DNR (do not resuscitate)   Shortness of breath   High risk medication use   Acute on chronic hypoxic respiratory failure Worsened resp status on 11/9 requiring non-rebreather  Secondary to multifocal pneumonia, systolic CHF  exacerbation, lung cancer, COVID. Was intubated on 9/1, extubated on 11/3. Underwent bronchoscopy for right lower lobe obstruction, right middle lobe and tracheal partial obstruction due to abnormal soft tissue, status post debulking.  Has chronic right upper lobe fibrosis, chronic right pleural effusion.  History of VATS, talc pleurodesis. Pathology report from bronc on 11/2 showed yeast but no malignant cells.  Started on Diflucan.  Currently on Solu-Medrol, will continue to taper when appropriate.  Continue Candiss Norse, budesonide. Completed course of molnupiravir for COVID Hypertonic saline nebulization, chest physiotherapy. Patient also has developed hoarseness,seen by ENT , Dr Janace Hoard no further management Worsened resp status on 11/9 requiring non-rebreather, transferred to SDU PCCM on board  Acute on chronic systolic CHF Elevated BNP Currently on 60 mg IV BID. Continue to monitor input/output, daily weights Continue metoprolol, hold entresto Echocardiogram on May 2023 showed EF of 19%, grade 1 diastolic dysfunction, repeat ECHO on 11/7 with no significant change from prior study in May Patient also has elevated troponin most likely secondary to type II MI  Elevated troponin NSVTach- multiple runs Likely 2/2 worsening resp failure with underlying sCHF Trend electrolytes, troponin EKG with T wave inversion PCCM discussed with cardiology (if trop continues to rise, will start heparin drip for 48H) Overall poor prognosis (refused ICD in the past)  Hypokalemia Replace as needed  Possible oropharyngeal candidiasis Already on fluconazole, added on nystatin as well as Magic mouth wash  History of coronary artery disease Chest pain free Continue Entresto, metoprolol s/p CABG in 1996 and PCI to dLM and SVG-D in 02/2014, plavix held due to melena  CKD stage IIIb, urine retention Continue Foley.  On Flomax  Baseline creatinine around 1.8-2  Acute on chronic thrombocytopenia/chronic anemia/elevated D-dimer Duplex negative on 10/26, 11/2 Patient found to have black tarry stool on 11/7, FOBT at bedside positive GI consulted/message sent to Dr Michail Sermon, no further recs Hemoglobin has remained stable in the range of 8 for last few days, continue Protonix twice daily  GERD: Continue protonix  Hyperlipidemia: On crestor  Debility/deconditioning: PT/OT consulted  Goals of care discussion Due to poor prognosis, palliative care consulted- placed pt on PCA pump for air hunger Changed to DNR  on 07/05/2022       Nutrition Problem: Moderate Malnutrition Etiology: chronic illness (COPD, CHF, and new fungating soft tissue mass) Pressure Injury 07/05/22 Buttocks Lower Stage 2 -  Partial thickness loss of dermis presenting as a shallow open injury with a red, pink wound bed without slough. Stage 1 nonblanchable with a small stage 2 in the middle (Active)  07/05/22 1154  Location: Buttocks  Location Orientation: Lower  Staging: Stage 2 -  Partial thickness loss of dermis presenting as a shallow open injury with a red, pink wound bed without slough.  Wound Description (Comments): Stage 1 nonblanchable with a small stage 2 in the middle  Present on Admission: No  Dressing Type Foam - Lift dressing to assess site every shift 07/06/22 0800    DVT prophylaxis:Place and maintain sequential compression device Start: 07/03/22 0936 Place TED hose Start: 06/19/22 1130 SCDs Start: 06/16/2022 1016     Code Status: DNR  Family Communication: Son at bedside.    Patient status:Inpatient  Patient is from :Home  Anticipated discharge to: TBD  Estimated DC date:TBD   Consultants: PCCM  Procedures:Intubation,bronch  Antimicrobials:  Anti-infectives (From admission, onward)    Start     Dose/Rate Route Frequency Ordered Stop   07/03/22 1000  fluconazole (DIFLUCAN) tablet 100 mg        100 mg Oral Daily 07/02/22 1342 07/09/22 0959   07/02/22 1500  fluconazole (DIFLUCAN) tablet 200 mg        200 mg Oral  Once 07/02/22 1342 07/02/22 1422   06/23/22 0600  Ampicillin-Sulbactam (UNASYN) 3 g in sodium chloride 0.9 % 100 mL IVPB  Status:  Discontinued  3 g 200 mL/hr over 30 Minutes Intravenous Every 6 hours 06/22/22 1739 07/02/22 1344   06/19/22 2200  molnupiravir EUA (LAGEVRIO) capsule 800 mg        4 capsule Oral 2 times daily 06/19/22 1514 06/24/22 2159   06/19/22 1000  azithromycin (ZITHROMAX) 500 mg in sodium chloride 0.9 % 250 mL IVPB  Status:  Discontinued        500  mg 250 mL/hr over 60 Minutes Intravenous Every 24 hours 06/02/2022 1931 06/22/22 1711   06/19/22 1000  cefTRIAXone (ROCEPHIN) 1 g in sodium chloride 0.9 % 100 mL IVPB        1 g 200 mL/hr over 30 Minutes Intravenous Every 24 hours 06/19/2022 2120 06/22/22 1026   06/02/2022 2200  nirmatrelvir/ritonavir EUA (renal dosing) (PAXLOVID) 2 tablet  Status:  Discontinued       Note to Pharmacy: Please adjust or change treatment as needed.   2 tablet Oral 2 times daily 06/05/2022 1929 06/19/22 1514   06/10/2022 2030  cefTRIAXone (ROCEPHIN) 1 g in sodium chloride 0.9 % 100 mL IVPB  Status:  Discontinued        1 g 200 mL/hr over 30 Minutes Intravenous Every 24 hours 06/02/2022 1931 06/01/2022 2120   05/29/2022 1000  levofloxacin (LEVAQUIN) tablet 500 mg  Status:  Discontinued        500 mg Oral Daily 06/08/2022 0943 05/30/2022 1943       Subjective: Today, pt continues to remain in resp failure with multiple beats of NSVT. Noted air hunger, placed on PCA pump by Palliative. Son was briefly at bedside.     Objective: Vitals:   07/06/22 1600 07/06/22 1700 07/06/22 1724 07/06/22 1800  BP: (!) 107/42 (!) 100/51 (!) 105/46 (!) 98/54  Pulse: (!) 116 (!) 106 (!) 110 (!) 103  Resp: (!) 24 (!) 41 (!) 27 (!) 26  Temp: 98.9 F (37.2 C)     TempSrc: Axillary     SpO2: 90% 100% 97% 98%  Weight:      Height:        Intake/Output Summary (Last 24 hours) at 07/06/2022 1829 Last data filed at 07/06/2022 0900 Gross per 24 hour  Intake 890.04 ml  Output --  Net 890.04 ml   Filed Weights   07/04/22 0500 07/05/22 0500 07/06/22 0500  Weight: 68.6 kg 68.5 kg 68.2 kg    Examination: General: In significant distress Cardiovascular: S1, S2 present Respiratory: Noted scattered rhonchi Abdomen: Soft, nontender, nondistended, bowel sounds present Musculoskeletal: No bilateral pedal edema noted Skin: Normal Psychiatry: Fair mood     Data Reviewed: I have personally reviewed following labs and imaging  studies  CBC: Recent Labs  Lab 07/01/22 0347 07/03/22 1011 07/04/22 0455 07/05/22 0358 07/06/22 0240  WBC 6.8 10.5 8.6 8.4 9.9  NEUTROABS  --   --   --   --  9.5*  HGB 8.6* 8.8* 8.5* 8.9* 8.8*  HCT 29.5* 31.0* 29.3* 30.0* 30.0*  MCV 89.4 93.1 90.4 89.8 90.6  PLT 68* 78* 77* 74* 86*   Basic Metabolic Panel: Recent Labs  Lab 07/02/22 1234 07/03/22 0451 07/04/22 0455 07/05/22 0358 07/06/22 0240 07/06/22 1440  NA  --  145 143 144 146* 145  K  --  3.6 3.2* 3.2* 2.8* 3.6  CL  --  102 94* 95* 93* 95*  CO2  --  35* 38* 39* 42* 40*  GLUCOSE  --  115* 104* 165* 119* 133*  BUN  --  34*  30* 34* 41* 45*  CREATININE  --  1.68* 1.77* 1.73* 2.07* 2.07*  CALCIUM  --  8.0* 8.0* 8.0* 8.0* 8.1*  MG 2.1 2.3 2.2 2.1 2.3 2.2  PHOS 2.3* 3.7 2.4*  --   --   --      Recent Results (from the past 240 hour(s))  Culture, Respiratory w Gram Stain     Status: None   Collection Time: 06/27/22  1:26 PM   Specimen: Bronchoalveolar Lavage; Respiratory  Result Value Ref Range Status   Specimen Description   Final    BRONCHIAL ALVEOLAR LAVAGE Performed at [facility] [address] [place], [place] [address]    Special Requests   Final    NONE Performed at [facility], [place] [address] [place], [place] [address]    Gram Stain   Final    NO WBC SEEN RARE BUDDING YEAST SEEN Performed at [facility], [place] [address] [place], [place] [address]    Culture FEW CANDIDA ALBICANS  Final   Report Status 06/30/2022 FINAL  Final     Radiology Studies: VAS [place] LOWER EXTREMITY VENOUS (DVT)  Result Date: 07/06/2022  Lower Venous DVT Study Patient Name:  Algie Westry.  Date of Exam:   07/06/2022 Medical Rec #: [id number]             Accession #:    [id number] Date of Birth: October 22, 1940              Patient Gender: M Patient Age:   27 years Exam Location:  [facility] Procedure:      VAS [place] LOWER EXTREMITY VENOUS (DVT) Referring Phys: PAULA  SIMPSON --------------------------------------------------------------------------------  Indications: Hypoxia. Other Indications: COVID+. Limitations: Poor ultrasound/tissue interface and calcific shadowing. Comparison       Previous exams on 06/21/22 and 06/29/22 were both negative for Study:           DVT Performing Technologist: Rogelia Rohrer RVT, RDMS  Examination Guidelines: A complete evaluation includes B-mode imaging, spectral Doppler, color Doppler, and power Doppler as needed of all accessible portions of each vessel. Bilateral testing is considered an integral part of a complete examination. Limited examinations for reoccurring indications may be performed as noted. The reflux portion of the exam is performed with the patient in reverse Trendelenburg.  +---------+---------------+---------+-----------+----------+-------------------+ RIGHT    CompressibilityPhasicitySpontaneityPropertiesThrombus Aging      +---------+---------------+---------+-----------+----------+-------------------+ CFV      Full           Yes      Yes                                      +---------+---------------+---------+-----------+----------+-------------------+ SFJ      Full                                                             +---------+---------------+---------+-----------+----------+-------------------+ FV Prox  Full           Yes      Yes                                      +---------+---------------+---------+-----------+----------+-------------------+  FV Mid   Full           Yes      Yes                  Not well visualized +---------+---------------+---------+-----------+----------+-------------------+ FV DistalFull           Yes      Yes                                      +---------+---------------+---------+-----------+----------+-------------------+ PFV      Full                                                              +---------+---------------+---------+-----------+----------+-------------------+ POP      Full           Yes      Yes                                      +---------+---------------+---------+-----------+----------+-------------------+ PTV      Full                                                             +---------+---------------+---------+-----------+----------+-------------------+ PERO     Full                                                             +---------+---------------+---------+-----------+----------+-------------------+   +---------+---------------+---------+-----------+----------+-------------------+ LEFT     CompressibilityPhasicitySpontaneityPropertiesThrombus Aging      +---------+---------------+---------+-----------+----------+-------------------+ CFV      Full           Yes      Yes                                      +---------+---------------+---------+-----------+----------+-------------------+ SFJ      Full                                                             +---------+---------------+---------+-----------+----------+-------------------+ FV Prox  Full           Yes      Yes                                      +---------+---------------+---------+-----------+----------+-------------------+ FV Mid   Full           Yes      Yes                                      +---------+---------------+---------+-----------+----------+-------------------+  FV DistalFull           Yes      Yes                                      +---------+---------------+---------+-----------+----------+-------------------+ PFV      Full                                                             +---------+---------------+---------+-----------+----------+-------------------+ POP      Full           Yes      Yes                                      +---------+---------------+---------+-----------+----------+-------------------+  PTV                                                   Not well visualized +---------+---------------+---------+-----------+----------+-------------------+ PERO                                                  Not well visualized +---------+---------------+---------+-----------+----------+-------------------+     Summary: BILATERAL: - No evidence of deep vein thrombosis seen in the lower extremities, bilaterally. -No evidence of popliteal cyst, bilaterally.   *See table(s) above for measurements and observations. Electronically signed by Monica Martinez MD on 07/06/2022 at 4:39:55 PM.    Final    DG Chest Port 1 View  Result Date: 07/05/2022 CLINICAL DATA:  Acute respiratory distress EXAM: PORTABLE CHEST 1 VIEW COMPARISON:  07/02/2022 chest radiograph. FINDINGS: Intact sternotomy wires. CABG clips overlie the mediastinum. Stable cardiomediastinal silhouette with top-normal heart size. No pneumothorax. Stable volume loss in the right hemithorax and dense upper right lung consolidation. Stable patchy bibasilar lung opacities. Stable mild blunting of the bilateral costophrenic angles. IMPRESSION: 1. Stable volume loss in the right hemithorax and dense upper right lung consolidation. Stable patchy bibasilar lung opacities, favor atelectasis. Findings favor multilobar pneumonia as described on recent chest CT. 2. Stable mild blunting of the bilateral costophrenic angles, cannot exclude small bilateral pleural effusions. Electronically Signed   By: Ilona Sorrel M.D.   On: 07/05/2022 10:10    Scheduled Meds:  arformoterol  15 mcg Nebulization BID   Chlorhexidine Gluconate Cloth  6 each Topical Daily   docusate  100 mg Oral BID   feeding supplement  237 mL Oral TID BM   fluconazole  100 mg Oral Daily   furosemide  40 mg Intravenous Daily   Gerhardt's butt cream   Topical TID   guaiFENesin  15 mL Oral Q12H   HYDROmorphone   Intravenous Q4H   insulin aspart  0-9 Units Subcutaneous Q4H    metoprolol tartrate  12.5 mg Oral Q6H   nystatin  5 mL Oral QID   pantoprazole (PROTONIX) IV  40 mg Intravenous Q12H   polyethylene glycol  17 g Oral Daily   potassium chloride  40 mEq Oral Once   revefenacin  175 mcg Nebulization Daily   rosuvastatin  20 mg Oral QHS   sodium chloride HYPERTONIC  4 mL Nebulization Daily   Continuous Infusions:  potassium chloride       LOS: 17 days   Alma Friendly, MD [facility] P11/05/2022, 6:29 PM

## 2022-07-06 NOTE — Progress Notes (Signed)
OT Cancellation Note  Patient Details [name]. MRN: [id number] DOB: 1940-12-29   Cancelled Treatment:    Reason Eval/Treat Not Completed: Patient not medically ready. Hold per RN request. Patient requiring 40 L & 80% Fio2, elevated HR and soft BP. Will f/u Monday.   Palmer Shorey L [place] 07/06/2022, 1:51 PM

## 2022-07-07 DIAGNOSIS — J189 Pneumonia, unspecified organism: Secondary | ICD-10-CM | POA: Diagnosis not present

## 2022-07-07 DIAGNOSIS — Z7189 Other specified counseling: Secondary | ICD-10-CM | POA: Diagnosis not present

## 2022-07-07 DIAGNOSIS — Z515 Encounter for palliative care: Secondary | ICD-10-CM | POA: Diagnosis not present

## 2022-07-07 DIAGNOSIS — I5043 Acute on chronic combined systolic (congestive) and diastolic (congestive) heart failure: Secondary | ICD-10-CM | POA: Diagnosis not present

## 2022-07-07 DIAGNOSIS — I952 Hypotension due to drugs: Secondary | ICD-10-CM

## 2022-07-07 DIAGNOSIS — I959 Hypotension, unspecified: Secondary | ICD-10-CM

## 2022-07-07 DIAGNOSIS — J9621 Acute and chronic respiratory failure with hypoxia: Secondary | ICD-10-CM | POA: Diagnosis not present

## 2022-07-07 DIAGNOSIS — R4589 Other symptoms and signs involving emotional state: Secondary | ICD-10-CM

## 2022-07-07 DIAGNOSIS — I509 Heart failure, unspecified: Secondary | ICD-10-CM

## 2022-07-07 DIAGNOSIS — G9340 Encephalopathy, unspecified: Secondary | ICD-10-CM

## 2022-07-07 LAB — GLUCOSE, CAPILLARY
Glucose-Capillary: 86 mg/dL (ref 70–99)
Glucose-Capillary: 101 mg/dL — ABNORMAL HIGH (ref 70–99)

## 2022-07-07 LAB — CBC
HCT: 30.9 % — ABNORMAL LOW (ref 39.0–52.0)
Hemoglobin: 9.3 g/dL — ABNORMAL LOW (ref 13.0–17.0)
MCH: 27.4 pg (ref 26.0–34.0)
MCHC: 30.1 g/dL (ref 30.0–36.0)
MCV: 90.9 fL (ref 80.0–100.0)
Platelets: 80 10*3/uL — ABNORMAL LOW (ref 150–400)
RBC: 3.4 MIL/uL — ABNORMAL LOW (ref 4.22–5.81)
RDW: 17.2 % — ABNORMAL HIGH (ref 11.5–15.5)
WBC: 11 10*3/uL — ABNORMAL HIGH (ref 4.0–10.5)
nRBC: 0 % (ref 0.0–0.2)

## 2022-07-07 LAB — BASIC METABOLIC PANEL
Anion gap: 9 (ref 5–15)
BUN: 47 mg/dL — ABNORMAL HIGH (ref 8–23)
CO2: 40 mmol/L — ABNORMAL HIGH (ref 22–32)
Calcium: 8.1 mg/dL — ABNORMAL LOW (ref 8.9–10.3)
Chloride: 96 mmol/L — ABNORMAL LOW (ref 98–111)
Creatinine, Ser: 1.91 mg/dL — ABNORMAL HIGH (ref 0.61–1.24)
GFR, Estimated: 35 mL/min — ABNORMAL LOW (ref >60)
Glucose, Bld: 109 mg/dL — ABNORMAL HIGH (ref 70–99)
Potassium: 3.8 mmol/L (ref 3.5–5.1)
Sodium: 145 mmol/L (ref 135–145)

## 2022-07-07 LAB — PHOSPHORUS: Phosphorus: 3 mg/dL (ref 2.5–4.6)

## 2022-07-07 LAB — MAGNESIUM: Magnesium: 2.3 mg/dL (ref 1.7–2.4)

## 2022-07-07 MED ORDER — SODIUM CHLORIDE 0.9 % IV SOLN
0.2000 mg/h | INTRAVENOUS | Status: DC
  Filled 2022-07-07: qty 5

## 2022-07-07 MED ORDER — LORAZEPAM 2 MG/ML IJ SOLN
INTRAMUSCULAR | Status: AC
  Filled 2022-07-07: qty 1

## 2022-07-07 MED ORDER — LORAZEPAM 2 MG/ML IJ SOLN: 0.5000 mg | INTRAMUSCULAR | Status: DC | PRN

## 2022-07-07 MED ORDER — HYDROMORPHONE 1 MG/ML IV SOLN: INTRAVENOUS | Status: DC

## 2022-07-07 MED ORDER — HALOPERIDOL LACTATE 5 MG/ML IJ SOLN: 0.5000 mg | INTRAMUSCULAR | Status: DC | PRN

## 2022-07-07 MED ORDER — POLYVINYL ALCOHOL 1.4 % OP SOLN: 1.0000 [drp] | Freq: Four times a day (QID) | OPHTHALMIC | Status: DC | PRN

## 2022-07-07 MED ORDER — HYDROMORPHONE HCL 1 MG/ML IJ SOLN
0.4000 mg | INTRAMUSCULAR | Status: DC | PRN
  Administered 2022-07-07: 0.4 mg via INTRAVENOUS
  Filled 2022-07-07: qty 1

## 2022-07-07 MED ORDER — LORAZEPAM 2 MG/ML PO CONC: 0.5000 mg | ORAL | Status: DC | PRN

## 2022-07-07 MED ORDER — GLYCOPYRROLATE 0.2 MG/ML IJ SOLN: 0.2000 mg | INTRAMUSCULAR | Status: DC | PRN

## 2022-07-07 MED ORDER — LORAZEPAM 2 MG/ML IJ SOLN
0.5000 mg | Freq: Once | INTRAMUSCULAR | Status: AC
  Administered 2022-07-07: 0.5 mg via INTRAVENOUS

## 2022-07-07 MED ORDER — BIOTENE DRY MOUTH MT LIQD: 15.0000 mL | OROMUCOSAL | Status: DC | PRN

## 2022-07-07 MED ORDER — LORAZEPAM 2 MG/ML IJ SOLN
0.5000 mg | INTRAMUSCULAR | Status: DC | PRN
  Administered 2022-07-07: 0.5 mg via INTRAVENOUS
  Filled 2022-07-07: qty 1

## 2022-07-08 LAB — GLUCOSE, CAPILLARY: Glucose-Capillary: 100 mg/dL — ABNORMAL HIGH (ref 70–99)

## 2022-07-27 NOTE — Death Summary Note (Signed)
DEATH SUMMARY   Patient Details  [name]. MRN: [id number] DOB: 1941-08-25 TWS:FKCL, Barnie Del, DO Admission/Discharge Information   Admit Date:  30-Jun-2022  Date of Death: Date of Death: 07/19/22  Time of Death: Time of Death: 11/15/08  Length of Stay: 11-24-22   Principle Cause of death: Acute on chronic hypoxic respiratory failure likely 2/2 COVID-19 Pneumonia     Hospital Diagnoses: Principal Problem:   Acute on chronic combined systolic and diastolic congestive heart failure (HCC) Active Problems:   HLD (hyperlipidemia)   Essential hypertension   GERD (gastroesophageal reflux disease)   CAD (coronary artery disease)   Thrombocytopenia (HCC)   Stage 3b chronic kidney disease (CKD) ([place])   Primary cancer of right upper lobe of lung (HCC)   Goals of care, counseling/discussion   Pleural effusion on right   Cardiomyopathy, ischemic   History of endovascular stent graft for abdominal aortic aneurysm (AAA)   Chronic obstructive pulmonary disease with acute exacerbation (HCC)   Acute respiratory failure with hypoxia (HCC)   Anemia of chronic kidney failure   COVID-19 virus infection   Aspiration pneumonia (HCC)   Urinary retention   Multifocal pneumonia   Hypotension due to drugs   Malnutrition of moderate degree   Acute on chronic respiratory failure with hypoxia and hypercapnia (HCC)   Pressure injury of skin   Palliative care encounter   DNR (do not resuscitate)   Shortness of breath   High risk medication use   Encephalopathy   Need for emotional support   Hypotension   Acute on chronic congestive heart failure [facility])   Hospital Course: Patient is a 81 year old male with history of tobacco use, COPD, heart failure with reduced ejection fraction, hyperlipidemia, CKD stage IIIb, anemia of chronic disease, 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  He was found to be  positive for COVID,found to be in acute hypoxic respiratory failure secondary to CHF exacerbation, pneumonia and COPD exacerbation.  He developed increased respiratory distress during this hospitalization and required to be transferred to [facility] service.  CTA chest showed multifocal pneumonia concerning for aspiration pneumonia, obstructive endobronchial material within the right lower lobe bronchus, near occlusive material in the right middle lobe bronchus, dense consolidation of the right lower lobe and left lower lobe.  Was intubated by ICU on 11/1, extubated and transferred back to [facility] service on 11/4. On 11/9 transferred back to SDU due to worsening respiratory status requiring non-rebreather and heated high flow.  On 11/10, patient noted to be worsening in terms of respiratory status, significantly tachycardic with multiple runs of nonsustained V. Tach despite all interventions.  On 07/20/2023, patient was noted to be delirious, intermittently confused and agitated, discussions with family members and the palliative team, patient was made comfort care.  Patient eventually passed away shortly after made comfort care.     Significant events:   07/01/2023 Admitted to [facility] 10/25 Increased O2 need to heated high flow. PCCM consulted 10/26 attempted right thoracentesis, no fluid obtained 10/26 CT-PA > no PE.  Right lower lobe consolidation with mucous, obstructing material right lower lobe and right middle lobe bronchi.  Left lower lobe and lingular airspace disease.  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  Right basilar pleural 10.8 cm fluid collection 10/28 worse infiltrates on CXR  10/30 solumedrol added, lasix to 40mg   BID  11/1 cont on unasyn. Intubated, bronch at bedside w mucus plugging 11/2 bronch w cryo, debulking of abnormal soft tissue occluding part of trachea, RML RLL 11/3 extubated  Transferred to  [facility] service on 11/4 Transferred to SDU on 11/9 due to worsening respiratory status requiring non-rebreather and heated high flow    Assessment and Plan:  Acute on chronic hypoxic respiratory failure COVID-19 pneumonia Secondary to multifocal pneumonia, systolic CHF  exacerbation, lung cancer, COVID Was intubated on 9/1, extubated on 11/3 Underwent bronchoscopy for right lower lobe obstruction, right middle lobe and tracheal partial obstruction due to abnormal soft tissue, status post debulking.  Has chronic right upper lobe fibrosis, chronic right pleural effusion.  History of VATS, talc pleurodesis. Pathology report from bronc on 11/2 showed yeast but no malignant cells Completed course of molnupiravir for COVID   Acute on chronic systolic CHF Echocardiogram on May 2023 showed EF of 01%, grade 1 diastolic dysfunction, repeat ECHO on 11/7 with no significant change from prior study in May Patient also has elevated troponin most likely secondary to type II MI   Elevated troponin NSVTach- multiple runs Likely 2/2 worsening resp failure with underlying sCHF Refused ICD in the past   Hypokalemia   Possible oropharyngeal candidiasis   History of coronary artery disease s/p CABG in 1996 and PCI to dLM and SVG-D in 02/2014, plavix held due to melena   CKD stage IIIb, urine retention   Acute on chronic thrombocytopenia/chronic anemia/elevated D-dimer Duplex negative on 10/26, 11/2 Patient found to have black tarry stool on 11/7, FOBT at bedside positive GI consulted/message sent to Dr Michail Sermon, no further recs Hemoglobin has remained stable   GERD  Hyperlipidemia   Debility/deconditioning: PT/OT consulted   Goals of care discussion Due to poor prognosis, palliative care consulted Changed to DNR on 07/05/2022 Made comfort care on 2022/07/13 and passed away shortly afterwards       Procedures: Mechanical ventilation, bronchoscopy  Consultations: PCCM   The results of  significant diagnostics from this hospitalization (including imaging, microbiology, ancillary and laboratory) are listed below for reference.   Significant Diagnostic Studies: VAS [place] LOWER EXTREMITY VENOUS (DVT)  Result Date: 07/06/2022  Lower Venous DVT Study Patient Name:  Shonta Phillis.  Date of Exam:   07/06/2022 Medical Rec #: [id number]             Accession #:    [id number] Date of Birth: 1941-03-01              Patient Gender: M Patient Age:   64 years Exam Location:  [facility] Procedure:      VAS [place] LOWER EXTREMITY VENOUS (DVT) Referring Phys: PAULA SIMPSON --------------------------------------------------------------------------------  Indications: Hypoxia. Other Indications: COVID+. Limitations: Poor ultrasound/tissue interface and calcific shadowing. Comparison       Previous exams on 06/21/22 and 06/29/22 were both negative for Study:           DVT Performing Technologist: Rogelia Rohrer RVT, RDMS  Examination Guidelines: A complete evaluation includes B-mode imaging, spectral Doppler, color Doppler, and power Doppler as needed of all accessible portions of each vessel. Bilateral testing is considered an integral part of a complete examination. Limited examinations for reoccurring indications may be performed as noted. The reflux portion of the exam is performed with the patient in reverse Trendelenburg.  +---------+---------------+---------+-----------+----------+-------------------+ RIGHT    CompressibilityPhasicitySpontaneityPropertiesThrombus Aging      +---------+---------------+---------+-----------+----------+-------------------+ CFV      Full  Yes      Yes                                      +---------+---------------+---------+-----------+----------+-------------------+ SFJ      Full                                                             +---------+---------------+---------+-----------+----------+-------------------+ FV Prox  Full            Yes      Yes                                      +---------+---------------+---------+-----------+----------+-------------------+ FV Mid   Full           Yes      Yes                  Not well visualized +---------+---------------+---------+-----------+----------+-------------------+ FV DistalFull           Yes      Yes                                      +---------+---------------+---------+-----------+----------+-------------------+ PFV      Full                                                             +---------+---------------+---------+-----------+----------+-------------------+ POP      Full           Yes      Yes                                      +---------+---------------+---------+-----------+----------+-------------------+ PTV      Full                                                             +---------+---------------+---------+-----------+----------+-------------------+ PERO     Full                                                             +---------+---------------+---------+-----------+----------+-------------------+   +---------+---------------+---------+-----------+----------+-------------------+ LEFT     CompressibilityPhasicitySpontaneityPropertiesThrombus Aging      +---------+---------------+---------+-----------+----------+-------------------+ CFV      Full           Yes      Yes                                      +---------+---------------+---------+-----------+----------+-------------------+  SFJ      Full                                                             +---------+---------------+---------+-----------+----------+-------------------+ FV Prox  Full           Yes      Yes                                      +---------+---------------+---------+-----------+----------+-------------------+ FV Mid   Full           Yes      Yes                                       +---------+---------------+---------+-----------+----------+-------------------+ FV DistalFull           Yes      Yes                                      +---------+---------------+---------+-----------+----------+-------------------+ PFV      Full                                                             +---------+---------------+---------+-----------+----------+-------------------+ POP      Full           Yes      Yes                                      +---------+---------------+---------+-----------+----------+-------------------+ PTV                                                   Not well visualized +---------+---------------+---------+-----------+----------+-------------------+ PERO                                                  Not well visualized +---------+---------------+---------+-----------+----------+-------------------+     Summary: BILATERAL: - No evidence of deep vein thrombosis seen in the lower extremities, bilaterally. -No evidence of popliteal cyst, bilaterally.   *See table(s) above for measurements and observations. Electronically signed by Monica Martinez MD on 07/06/2022 at 4:39:55 PM.    Final    DG Chest Port 1 View  Result Date: 07/05/2022 CLINICAL DATA:  Acute respiratory distress EXAM: PORTABLE CHEST 1 VIEW COMPARISON:  07/02/2022 chest radiograph. FINDINGS: Intact sternotomy wires. CABG clips overlie the mediastinum. Stable cardiomediastinal silhouette with top-normal heart size. No pneumothorax. Stable volume loss in the right hemithorax and dense upper right lung consolidation. Stable patchy bibasilar lung opacities. Stable mild blunting  of the bilateral costophrenic angles. IMPRESSION: 1. Stable volume loss in the right hemithorax and dense upper right lung consolidation. Stable patchy bibasilar lung opacities, favor atelectasis. Findings favor multilobar pneumonia as described on recent chest CT. 2. Stable mild blunting of the  bilateral costophrenic angles, cannot exclude small bilateral pleural effusions. Electronically Signed   By: Ilona Sorrel M.D.   On: 07/05/2022 10:10   ECHOCARDIOGRAM COMPLETE  Result Date: 07/03/2022    ECHOCARDIOGRAM REPORT   Patient Name:   Dymond Spreen. Date of Exam: 07/03/2022 Medical Rec #:  [id number]            Height:       70.0 in Accession #:    [id number]           Weight:       155.2 lb Date of Birth:  December 13, 1940             BSA:          1.874 m Patient Age:    27 years             BP:           113/99 mmHg Patient Gender: M                    HR:           96 bpm. Exam Location:  Inpatient Procedure: 2D Echo Indications:    acute respiratory distress  History:        Patient has prior history of Echocardiogram examinations, most                 recent 03/28/2022. Risk Factors:Hypertension and Dyslipidemia.  Sonographer:    Harvie Junior Referring Phys: [phone] MURALI RAMASWAMY  Sonographer Comments: Technically difficult study due to poor echo windows. IMPRESSIONS  1. Very poor quality images despite attempted Definity administration. The entire apex appears dyskinetic. Left ventricular ejection fraction, by estimation, is 30 to 35%. The left ventricle has moderately decreased function. Left ventricular endocardial border not optimally defined to evaluate regional wall motion. Left ventricular diastolic parameters are consistent with Grade I diastolic dysfunction (impaired relaxation).  2. Right ventricular systolic function is moderately reduced. The right ventricular size is normal.  3. The mitral valve is normal in structure. Trivial mitral valve regurgitation.  4. The aortic valve is tricuspid. There is mild calcification of the aortic valve. Aortic valve regurgitation is not visualized. Aortic valve sclerosis/calcification is present, without any evidence of aortic stenosis. Comparison(s): No significant change from prior study. Prior images reviewed side by side. FINDINGS  Left Ventricle: Very  poor quality images despite attempted Definity administration. The entire apex appears dyskinetic. Left ventricular ejection fraction, by estimation, is 30 to 35%. The left ventricle has moderately decreased function. Left ventricular endocardial border not optimally defined to evaluate regional wall motion. The left ventricular internal cavity size was normal in size. There is no left ventricular hypertrophy. Left ventricular diastolic parameters are consistent with Grade  I diastolic dysfunction (impaired relaxation). Normal left ventricular filling pressure. Right Ventricle: The right ventricular size is normal. Right vetricular wall thickness was not well visualized. Right ventricular systolic function is moderately reduced. Left Atrium: Left atrial size was normal in size. Right Atrium: Right atrial size was normal in size. Pericardium: There is no evidence of pericardial effusion. Mitral Valve: The mitral valve is normal in structure. Trivial mitral valve regurgitation. Tricuspid Valve: The tricuspid valve is grossly normal. Tricuspid  valve regurgitation is not demonstrated. Aortic Valve: The aortic valve is tricuspid. There is mild calcification of the aortic valve. Aortic valve regurgitation is not visualized. Aortic valve sclerosis/calcification is present, without any evidence of aortic stenosis. Aortic valve mean gradient measures 3.0 mmHg. Aortic valve peak gradient measures 4.4 mmHg. Aortic valve area, by VTI measures 2.79 cm. Pulmonic Valve: The pulmonic valve was not well visualized. Aorta: The aortic root is normal in size and structure. IAS/Shunts: The interatrial septum was not well visualized.  LEFT VENTRICLE PLAX 2D LVIDd:         5.10 cm      Diastology LVIDs:         3.80 cm      LV e' medial:    5.55 cm/s LV PW:         0.80 cm      LV E/e' medial:  10.3 LV IVS:        0.80 cm      LV e' lateral:   5.55 cm/s LVOT diam:     2.10 cm      LV E/e' lateral: 10.3 LV SV:         51 LV SV Index:   27  LVOT Area:     3.46 cm  LV Volumes (MOD) LV vol d, MOD A2C: 115.0 ml LV vol d, MOD A4C: 170.0 ml LV vol s, MOD A2C: 83.2 ml LV vol s, MOD A4C: 108.0 ml LV SV MOD A2C:     31.8 ml LV SV MOD A4C:     170.0 ml LV SV MOD BP:      53.7 ml RIGHT VENTRICLE RV Basal diam:  3.90 cm RV Mid diam:    3.60 cm TAPSE (M-mode): 1.9 cm LEFT ATRIUM           Index        RIGHT ATRIUM           Index LA diam:      2.90 cm 1.55 cm/m   RA Area:     12.70 cm LA Vol (A2C): 33.5 ml 17.88 ml/m  RA Volume:   30.80 ml  16.44 ml/m LA Vol (A4C): 40.4 ml 21.56 ml/m  AORTIC VALVE                    PULMONIC VALVE AV Area (Vmax):    2.54 cm     PV Vmax:       0.65 m/s AV Area (Vmean):   2.41 cm     PV Peak grad:  1.7 mmHg AV Area (VTI):     2.79 cm AV Vmax:           105.00 cm/s AV Vmean:          74.700 cm/s AV VTI:            0.181 m AV Peak Grad:      4.4 mmHg AV Mean Grad:      3.0 mmHg LVOT Vmax:         77.00 cm/s LVOT Vmean:        52.000 cm/s LVOT VTI:          0.146 m LVOT/AV VTI ratio: 0.81  AORTA Ao Root diam: 3.40 cm MITRAL VALVE MV Area (PHT): 3.65 cm    SHUNTS MV Decel Time: 208 msec    Systemic VTI:  0.15 m MR Peak grad: 14.1 mmHg    Systemic Diam: 2.10 cm MR Vmax:  188.00 cm/s MV E velocity: 57.40 cm/s MV A velocity: 84.40 cm/s MV E/A ratio:  0.68 Mihai Croitoru MD Electronically signed by Sanda Klein MD Signature Date/Time: 07/03/2022/4:22:02 PM    Final    DG CHEST PORT 1 VIEW  Result Date: 07/02/2022 CLINICAL DATA:  Respiratory failure, hypoxia EXAM: PORTABLE CHEST 1 VIEW COMPARISON:  Previous studies including the examination of 06/27/2022 FINDINGS: Transverse diameter of heart is increased. There is previous coronary bypass surgery. There is homogeneous infiltrate in right upper lung field with no significant change. There are patchy alveolar densities in both lower lung fields. There is blunting of both lateral CP angles patient there is interval removal of endotracheal tube and and enteric tube.  IMPRESSION: There are alveolar and interstitial infiltrates in right upper lobe and both lower lobes with no significant interval change. Findings suggest multifocal pneumonia. Small bilateral pleural effusions. Electronically Signed   By: Elmer Picker M.D.   On: 07/02/2022 11:49   DG Abd 1 View  Result Date: 07/19/2022 CLINICAL DATA:  Orogastric tube EXAM: ABDOMEN - 1 VIEW COMPARISON:  Abdominal x-ray 07/17/2022 FINDINGS: Orogastric tube is folded in the mid stomach with distal tip near the gastric fundus/gastroesophageal junction. No dilated bowel loops are seen. There surgical clips in the right abdomen. Aorto bi-iliac graft is again noted. Small right pleural effusion and right mid lung opacities persist. IMPRESSION: Orogastric tube is folded in the mid stomach with distal tip near the gastric fundus/gastroesophageal junction. Recommend repositioning. Electronically Signed   By: Ronney Asters M.D.   On: 07/26/2022 23:57   VAS [place] LOWER EXTREMITY VENOUS (DVT)  Result Date: 07/22/2022  Lower Venous DVT Study Patient Name:  Harlem Bula.  Date of Exam:   06/29/2022 Medical Rec #: [id number]             Accession #:    [id number] Date of Birth: July 28, 1941              Patient Gender: M Patient Age:   1 years Exam Location:  [facility] Procedure:      VAS [place] LOWER EXTREMITY VENOUS (DVT) Referring Phys: GRACE BOWSER --------------------------------------------------------------------------------  Indications: Edema.  Risk Factors: COVID 19 positive. Limitations: Poor ultrasound/tissue interface and patient positioning, patient immobility. Comparison Study: 06/21/2022 - RIGHT:                   - There is no evidence of deep vein thrombosis in the lower                   extremity.                    - No cystic structure found in the popliteal fossa.                    LEFT:                   - There is no evidence of deep vein thrombosis in the lower                   extremity.                    However, portions of this examination were limited- see                   technologist  comments above.                    - No cystic structure found in the popliteal fossa. Performing Technologist: Oliver Hum RVT  Examination Guidelines: A complete evaluation includes B-mode imaging, spectral Doppler, color Doppler, and power Doppler as needed of all accessible portions of each vessel. Bilateral testing is considered an integral part of a complete examination. Limited examinations for reoccurring indications may be performed as noted. The reflux portion of the exam is performed with the patient in reverse Trendelenburg.  +---------+---------------+---------+-----------+----------+--------------+ RIGHT    CompressibilityPhasicitySpontaneityPropertiesThrombus Aging +---------+---------------+---------+-----------+----------+--------------+ CFV      Full           Yes      Yes                                 +---------+---------------+---------+-----------+----------+--------------+ SFJ      Full                                                        +---------+---------------+---------+-----------+----------+--------------+ FV Prox  Full                                                        +---------+---------------+---------+-----------+----------+--------------+ FV Mid   Full                                                        +---------+---------------+---------+-----------+----------+--------------+ FV DistalFull                                                        +---------+---------------+---------+-----------+----------+--------------+ PFV      Full                                                        +---------+---------------+---------+-----------+----------+--------------+ POP      Full           Yes      Yes                                  +---------+---------------+---------+-----------+----------+--------------+ PTV      Full                                                        +---------+---------------+---------+-----------+----------+--------------+ PERO     Full                                                        +---------+---------------+---------+-----------+----------+--------------+   +---------+---------------+---------+-----------+----------+--------------+  LEFT     CompressibilityPhasicitySpontaneityPropertiesThrombus Aging +---------+---------------+---------+-----------+----------+--------------+ CFV      Full           Yes      Yes                                 +---------+---------------+---------+-----------+----------+--------------+ SFJ      Full                                                        +---------+---------------+---------+-----------+----------+--------------+ FV Prox  Full                                                        +---------+---------------+---------+-----------+----------+--------------+ FV Mid   Full                                                        +---------+---------------+---------+-----------+----------+--------------+ FV DistalFull                                                        +---------+---------------+---------+-----------+----------+--------------+ PFV      Full                                                        +---------+---------------+---------+-----------+----------+--------------+ POP      Full           Yes      Yes                                 +---------+---------------+---------+-----------+----------+--------------+ PTV      Full                                                        +---------+---------------+---------+-----------+----------+--------------+ PERO     Full                                                         +---------+---------------+---------+-----------+----------+--------------+     Summary: RIGHT: - There is no evidence of deep vein thrombosis in the lower extremity. However, portions of this examination were limited- see technologist comments above.  - No cystic structure found in the popliteal fossa.  LEFT: - There is no evidence  of deep vein thrombosis in the lower extremity. However, portions of this examination were limited- see technologist comments above.  - No cystic structure found in the popliteal fossa.  *See table(s) above for measurements and observations. Electronically signed by Jamelle Haring on 06/27/2022 at 4:02:34 PM.    Final    DG Abd 1 View  Result Date: 07/22/2022 CLINICAL DATA:  Nasogastric tube placement EXAM: ABDOMEN - 1 VIEW COMPARISON:  None Available. FINDINGS: Nasogastric tube tip seen within the proximal body of the stomach, however, the catheter appears folded upon itself and possibly kinked within the distal stomach. Visualized abdominal gas pattern is nonobstructive. Small bilateral pleural effusions are seen. IMPRESSION: 1. Nasogastric tube tip within the proximal body of the stomach. The catheter, however, may be kinked within the distal body of the stomach and correlation for appropriate catheter function is recommended. If abnormal, withdrawal of the catheter by 10 cm may be helpful. Electronically Signed   By: Fidela Salisbury M.D.   On: 07/26/2022 04:15   CT CHEST WO CONTRAST  Result Date: 06/27/2022 CLINICAL DATA:  Abnormal x-ray, foreign body. EXAM: CT CHEST WITHOUT CONTRAST TECHNIQUE: Multidetector CT imaging of the chest was performed following the standard protocol without IV contrast. RADIATION DOSE REDUCTION: This exam was performed according to the departmental dose-optimization program which includes automated exposure control, adjustment of the mA and/or kV according to patient size and/or use of iterative reconstruction technique. COMPARISON:  Chest x-ray  06/27/2022. CT angiogram chest 06/21/2022. FINDINGS: Cardiovascular: Heart is mildly enlarged. There is no pericardial effusion. Aorta is normal in size. There are atherosclerotic calcifications of the aorta and coronary arteries. Patient is status post cardiac surgery. Mediastinum/Nodes: Enteric tube is seen within nondilated esophagus. Visualized thyroid gland is within normal limits. No definite enlarged lymph nodes are identified. Difficult to evaluate for right hilar lymphadenopathy secondary to lack of contrast and adjacent airspace disease. Lungs/Pleura: Loculated moderate-sized right pleural effusion with some pleural calcifications inferiorly appears unchanged from the prior examination. Previously identified air in the right pleural fluid is no longer seen. Right lower lobe airspace consolidation and right middle lobe airspace consolidation has decreased from prior, but has not completely resolved. Right upper lobe consolidation is stable. Again seen is narrowing of the right lower lobe bronchus. Residual patchy airspace and ground-glass opacities are seen in the right middle lobe and right lower lobe. Previously identified multifocal airspace and ground-glass opacities throughout the left upper lobe and left lower lobe have significantly decreased; however, there is a new peripheral area of dense consolidation in the lateral inferior left lower lobe. Endotracheal tube tip is 3.2 cm above the carina. Upper Abdomen: Enteric tube tip is in the body of the stomach. Cholecystectomy clips are present. Musculoskeletal: Sternotomy wires are present. No acute fractures are seen. Chronic compression deformity of T4 is unchanged. IMPRESSION: 1. Loculated right pleural effusion is unchanged in size. Previously identified air in the pleural fluid is no longer seen. 2. Right lower lobe and right middle lobe airspace consolidation has decreased, but has not completely resolved. Right upper lobe consolidation is stable.  3. Multifocal airspace and ground-glass opacities throughout the left lung have significantly decreased; however, there is a new peripheral area of consolidation in the left lower lobe worrisome for pneumonia. 4. Stable cardiomegaly. 5. Enteric tube tip is in the body of the stomach. Aortic Atherosclerosis (ICD10-I70.0). Electronically Signed   By: Ronney Asters M.D.   On: 06/27/2022 18:42   DG Abd 1 View  Result  Date: 06/27/2022 CLINICAL DATA:  Nasogastric tube placement EXAM: ABDOMEN - 1 VIEW COMPARISON:  Portable exam 1314 hours without priors for comparison FINDINGS: Tip of nasogastric tube projects over proximal stomach. Bibasilar pulmonary opacities. Aortic stent. Nonobstructive bowel gas pattern. Surgical clips RIGHT upper quadrant question cholecystectomy. Prior CABG. IMPRESSION: Nasogastric tube tip projects over proximal stomach. Electronically Signed   By: Lavonia Dana M.D.   On: 06/27/2022 14:09   DG CHEST PORT 1 VIEW  Result Date: 06/27/2022 CLINICAL DATA:  Intubation; history COPD, cancer, coronary artery disease post MI, CHF, hypertension EXAM: PORTABLE CHEST 1 VIEW COMPARISON:  Portable exam 1312 hours compared to 0953 hours FINDINGS: Tip of endotracheal tube projects 5.1 cm above carina. Nasogastric tube extends into stomach. Normal heart size post CABG. Extensive infiltrates RIGHT upper and RIGHT lower lung with additional infiltrate laterally at LEFT base. Probable associated small pleural effusions bilaterally. Atherosclerotic calcification aorta. No pneumothorax. IMPRESSION: Persistent BILATERAL pulmonary infiltrates and small pleural effusions. Electronically Signed   By: Lavonia Dana M.D.   On: 06/27/2022 14:09   DG CHEST PORT 1 VIEW  Result Date: 06/27/2022 CLINICAL DATA:  acute respiratory failure with hypoxemia in the setting of COVID 19 pneumonia. He has COPD and CHF at baseline and was diagnosed with Stage IIIa NSCLC in 2017 EXAM: PORTABLE CHEST - 1 VIEW COMPARISON:  06/23/2022  FINDINGS: Some interval improvement in the patchy airspace opacities at the left lung base. No new infiltrate. Extensive right pleuroparenchymal thickening +/-effusion with extensive pulmonary consolidation/atelectasis as before. Heart size and mediastinal contours are within normal limits. Aortic Atherosclerosis (ICD10-170.0). CABG markers. Sternotomy wires. IMPRESSION: 1. Some interval improvement in left lower lobe airspace disease. 2. Persistent extensive right pleuroparenchymal disease. Electronically Signed   By: Lucrezia Europe M.D.   On: 06/27/2022 10:33   VAS [place] LOWER EXTREMITY VENOUS (DVT)  Result Date: 06/23/2022  Lower Venous DVT Study Patient Name:  Talib Headley.  Date of Exam:   06/21/2022 Medical Rec #: [id number]             Accession #:    [id number] Date of Birth: [birth year]              Patient Gender: M Patient Age:   58 years Exam Location:  [facility] Procedure:      VAS [place] LOWER EXTREMITY VENOUS (DVT) Referring Phys: Bretta Bang GONFA --------------------------------------------------------------------------------  Indications: Elevated d-dimer, COVID-19.  Limitations: Poor ultrasound/tissue interface. Comparison Study: No prior study Performing Technologist: Maudry Mayhew MHA, RDMS, RVT, RDCS  Examination Guidelines: A complete evaluation includes B-mode imaging, spectral Doppler, color Doppler, and power Doppler as needed of all accessible portions of each vessel. Bilateral testing is considered an integral part of a complete examination. Limited examinations for reoccurring indications may be performed as noted. The reflux portion of the exam is performed with the patient in reverse Trendelenburg.  +---------+---------------+---------+-----------+----------+--------------+ RIGHT    CompressibilityPhasicitySpontaneityPropertiesThrombus Aging +---------+---------------+---------+-----------+----------+--------------+ CFV      Full           Yes      Yes                                  +---------+---------------+---------+-----------+----------+--------------+ SFJ      Full                                                        +---------+---------------+---------+-----------+----------+--------------+  FV Prox  Full                                                        +---------+---------------+---------+-----------+----------+--------------+ FV Mid   Full                                                        +---------+---------------+---------+-----------+----------+--------------+ FV DistalFull                                                        +---------+---------------+---------+-----------+----------+--------------+ PFV      Full                                                        +---------+---------------+---------+-----------+----------+--------------+ POP      Full           Yes      Yes                                 +---------+---------------+---------+-----------+----------+--------------+ PTV      Full                                                        +---------+---------------+---------+-----------+----------+--------------+ PERO     Full                                                        +---------+---------------+---------+-----------+----------+--------------+   +---------+---------------+---------+-----------+----------+--------------+ LEFT     CompressibilityPhasicitySpontaneityPropertiesThrombus Aging +---------+---------------+---------+-----------+----------+--------------+ CFV      Full           Yes      Yes                                 +---------+---------------+---------+-----------+----------+--------------+ SFJ      Full                                                        +---------+---------------+---------+-----------+----------+--------------+ FV Prox  Full                                                         +---------+---------------+---------+-----------+----------+--------------+  FV Mid   Full                                                        +---------+---------------+---------+-----------+----------+--------------+ FV DistalFull                                                        +---------+---------------+---------+-----------+----------+--------------+ PFV      Full                                                        +---------+---------------+---------+-----------+----------+--------------+ POP      Full           Yes      Yes                                 +---------+---------------+---------+-----------+----------+--------------+   Left Technical Findings: Not visualized segments include PTV, peroneal veins.   Summary: RIGHT: - There is no evidence of deep vein thrombosis in the lower extremity.  - No cystic structure found in the popliteal fossa.  LEFT: - There is no evidence of deep vein thrombosis in the lower extremity. However, portions of this examination were limited- see technologist comments above.  - No cystic structure found in the popliteal fossa.  *See table(s) above for measurements and observations. Electronically signed by Jamelle Haring on 06/23/2022 at 10:29:43 AM.    Final    DG Chest Port 1 View  Result Date: 06/23/2022 CLINICAL DATA:  Acute respiratory failure.  Pleural effusion. EXAM: PORTABLE CHEST 1 VIEW COMPARISON:  June 21, 2022 FINDINGS: Opacity in the left mid and lower lung is similar to slightly worsened in the interval. The right lung remains poorly aerated, similar to slightly worsened in the interval. Dense opacification of the right apex and right base remains. The cardiomediastinal silhouette is stable. No pneumothorax. No other interval changes. IMPRESSION: The bilateral pulmonary opacities are similar to slightly worsened in the interval. No other changes. Recommend continued attention on short-term follow-up imaging to ensure  resolution of the opacities. Electronically Signed   By: Dorise Bullion III M.D.   On: 06/23/2022 08:19   CT Angio Chest Pulmonary Embolism (PE) W or WO Contrast  Result Date: 06/21/2022 CLINICAL DATA:  Pulmonary embolism (PE) suspected EXAM: CT ANGIOGRAPHY CHEST WITH CONTRAST TECHNIQUE: Multidetector CT imaging of the chest was performed using the standard protocol during bolus administration of intravenous contrast. Multiplanar CT image reconstructions and MIPs were obtained to evaluate the vascular anatomy. RADIATION DOSE REDUCTION: This exam was performed according to the departmental dose-optimization program which includes automated exposure control, adjustment of the mA and/or kV according to patient size and/or use of iterative reconstruction technique. CONTRAST:  39mL OMNIPAQUE IOHEXOL 350 MG/ML SOLN COMPARISON:  CT 03/12/2022, same day radiograph. FINDINGS: Cardiovascular: Satisfactory opacification of the pulmonary arteries to the segmental level, with exception of the right upper lobe arterial branches, left lower lobe, and lingular branches.  The right upper lobe arterial branches are narrowed related to chronic pulmonary fibrosis. The left lower lobe and lingular branches are obscured by respiratory motion artifact. There is no evidence of pulmonary embolism within these limitations. Mild cardiomegaly.Prior coronary artery bypass. Moderate atherosclerosis of thoracic aorta. Mediastinum/Nodes: No lymphadenopathy. The thyroid is unremarkable. Lungs/Pleura: There is inter marked material obstructing the right lower lobe bronchus, with extensive consolidation throughout the right lower lobe, which appears heterogeneous. Near occlusive material in the right middle lobe bronchus. There is diffuse bronchial wall thickening on the left. There is left lower lobe and lingular airspace disease and dense left lateral basilar consolidation. 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 There is a right basilar pleural space collection measuring 4.9 x 10.8 x 5.7 cm (series 4, image 73, series 7, image 57). There is air punctate foci of internal gas likely related to recent thoracentesis attempt. There is adjacent thickening of the pleura with calcification, consistent with a chronic collection. Upper Abdomen: Prior cholecystectomy. Partially visualized aortic stent. No acute findings. Musculoskeletal: Unchanged chronic T4 compression deformity. No acute osseous abnormality. No suspicious osseous lesion. Review of the MIP images confirms the above findings. IMPRESSION: Multifocal pneumonia, likely aspiration-related. Obstructive endobronchial material within the right lower lobe bronchus and near-occlusive material in the right middle lobe bronchus. Dense consolidation of the right lower lobe, patchy airspace disease in the left upper and left lower lobes, and dense consolidation in the peripheral left lower lobe. Chronic right-sided pleural collection measuring 4.9 x 10.8 x 5.7 cm, with adjacent pleural thickening and calcification. This is similar in size to prior CT in July. Internal foci of gas likely related to recent thoracentesis attempt. No evidence of pulmonary embolism, with limitations in evaluation as discussed above. Chronic right upper lung pulmonary fibrosis. Electronically Signed   By: Maurine Simmering M.D.   On: 06/21/2022 16:50   DG Chest 1 View  Result Date: 06/21/2022 CLINICAL DATA:  Neck 6s full thoracentesis, abnormal chest x-ray EXAM: CHEST  1 VIEW COMPARISON:  06/20/2022, 03/12/2022 FINDINGS: Single frontal view of the chest demonstrates stable postsurgical changes from CABG. The cardiac silhouette remains enlarged. Dense right apical consolidation unchanged. Loculated right pleural effusion as seen on prior CT imaging again noted and stable. No evidence of right-sided pneumothorax after attempted thoracentesis. Slight increase in the patchy left basilar  airspace disease seen previously. Small left effusion is suspected. No pneumothorax. IMPRESSION: 1. No complication after unsuccessful right thoracentesis. 2. Stable dense right apical consolidation and associated volume loss. Stable loculated effusion at the right lung base. 3. Increased left basilar airspace disease and likely small left pleural effusion. Electronically Signed   By: Randa Ngo M.D.   On: 06/21/2022 16:14   US THORACENTESIS ASP PLEURAL SPACE W/IMG GUIDE  Result Date: 06/21/2022 INDICATION: Patient with a history of heart failure, COPD, chronic kidney disease and right lung cancer. Patient admitted with shortness of breath. COVID positive with bilateral pleural effusions. Interventional radiology asked to perform a diagnostic and therapeutic thoracentesis. EXAM: ULTRASOUND GUIDED THORACENTESIS MEDICATIONS: 10 mL COMPLICATIONS: None immediate. PROCEDURE: An ultrasound guided thoracentesis was thoroughly discussed with the patient and questions answered. The benefits, risks, alternatives and complications were also discussed. The patient understands and wishes to proceed with the procedure. Written consent was obtained. Ultrasound was performed to localize and mark an adequate pocket of fluid in the right chest. The fluid is loculated on ultrasound imaging. The area was then prepped and draped in the  normal sterile fashion. 1% Lidocaine was used for local anesthesia. Under ultrasound guidance a 6 Fr Safe-T-Centesis catheter was introduced. Ultrasound imaging confirms catheter within the loculated fluid but I was unable to remove any fluid other than a scant amount that I was able to pull into the tubing. I was not able to obtain enough for lab sample. The catheter was removed and a dressing applied. FINDINGS: Scant blood-tinged serosanguineous fluid was aspirated into the tubing of the Safe-T-Centesis. There was not enough sample to sample the lab. IMPRESSION: Unsuccessful ultrasound guided  right thoracentesis yielding 0 mL of pleural fluid. Read by: Soyla Dryer, NP Electronically Signed   By: Aletta Edouard M.D.   On: 06/21/2022 15:54   DG Chest Port 1 View  Result Date: 06/20/2022 CLINICAL DATA:  Shortness of breath increasing. EXAM: PORTABLE CHEST 1 VIEW COMPARISON:  06/01/2022 FINDINGS: Postoperative changes in the mediastinum. Cardiac enlargement. Shallow inspiration. Bilateral pleural effusions, greater on the right. Consolidation and volume loss in the right upper lung with prominence of the right hilum. This is likely vascular as no mass was seen on previous chest CT from 03/12/2022. Right upper lung consolidation appears to be chronic and was present on that prior study. Consolidation again demonstrated in the left lung base, similar to prior chest radiograph. Calcification of the aorta. Degenerative changes in the spine. IMPRESSION: 1. Cardiac enlargement.  Bilateral pleural effusions. 2. Chronic consolidation and volume loss in the right upper lung. 3. Focal consolidation in the left lower lung is similar to prior chest radiograph. Electronically Signed   By: Lucienne Capers M.D.   On: 06/20/2022 20:56   DG Chest 2 View  Result Date: 06/15/2022 CLINICAL DATA:  Shortness of breath and bilateral leg swelling. History of lung cancer EXAM: CHEST - 2 VIEW COMPARISON:  06/02/2022 FINDINGS: Previous median sternotomy and CABG procedure. Stable cardiomediastinal contours. Unchanged appearance of right pleural effusion. Similar appearance of asymmetric right lung volume loss. Opacities overlying the right apex and right lung base compatible with chronic loculated fibrothorax. Confluent fibrotic changes within the remaining portions of the right lung appear unchanged compatible with post treatment changes There is a new airspace opacity identified within the anterior left lower lobe concerning for either pneumonia or aspiration. IMPRESSION: 1. New airspace opacity within the anterior  left lower lobe concerning for either pneumonia or aspiration. 2. No change in aeration to right lung compared with previous exam. Electronically Signed   By: Kerby Moors M.D.   On: 05/30/2022 07:21    Microbiology: No results found for this or any previous visit (from the past 240 hour(s)).  Time spent: 40 minutes  Signed: Alma Friendly, MD Jul 09, 2022

## 2022-07-27 NOTE — Progress Notes (Deleted)
PHARMACIST - PHYSICIAN COMMUNICATION    CONCERNING: IV to Oral Route Change Policy  RECOMMENDATION: This patient is receiving famotidine by the intravenous route.  Based on criteria approved by the Pharmacy and Therapeutics Committee, the intravenous medication(s) is/are being converted to the equivalent oral dose form(s).   DESCRIPTION: These criteria include: The patient is eating (either orally or via tube) and/or has been taking other orally administered medications for a least 24 hours The patient has no evidence of active gastrointestinal bleeding or impaired GI absorption (gastrectomy, short bowel, patient on TNA or NPO).  If you have questions about this conversion, please contact the Pharmacy Department  []   [phone] )  [facility] []   [phone] )  [facility] []   [phone] )  [facility] []   [phone] )  [facility] [x]   [phone] )  [facility]

## 2022-07-27 NOTE — Progress Notes (Signed)
RT was called in the room. Pt seems to be agitated and not listening. Encouraged pt to try to wear bipap pt is still refusing. Stable VS but pt seems uneasy. PRN and scheduled neb treatments given. RN aware.

## 2022-07-27 NOTE — Progress Notes (Signed)
NAME:  Paul Donigan., MRN:  [id number], DOB:  [birth year], LOS: 73 ADMISSION DATE:  06/07/2022, CONSULTATION DATE:  10/26 REFERRING MD:  Dr. Cyndia Skeeters, [facility] CHIEF COMPLAINT:  Dyspnea   History of Present Illness:   81 y/o male admitted with acute respiratory failure with hypoxemia in the setting of COVID 19  pneumonia.  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  He is followed by Dr. Elsworth Soho in the pulmonary office.  Pertinent  Medical History  Former smoker, COPD, Chronic HFrEF (03/2022> EF 30-35%/ G1DD/ mildly reduced RVSF)/ HFpEF, HLD, CKD 3b, anemia of chronic disease; stage IIIa NSCLC RUL treated with chemo/rad 2017  Significant Hospital Events: Including procedures, antibiotic start and stop dates in addition to other pertinent events   03/28/22 baseline ECHO LVEF 35% - same since 2021 (ws 20-25% in 2019 and 2018) 10/23 Admitted to [facility] - PCR POSITIVe CRP 3.4 10/24 D-dimer 2.5 10/25 Increased O2 need to heated high flow. PCCM consulted 10/26 attempted right thoracentesis, no fluid obtained LE Duplex - neg DVT 10/26 CT-PA > no PE.  Right lower lobe consolidation with mucous, obstructing material right lower lobe and right middle lobe bronchi.  Left lower lobe and lingular airspace disease.  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  Right basilar pleural 10.8 cm fluid collection 10/28 worse infiltrates on CXR  10/30 solumedrol added, lasix to 28m BID  11/01  INTUBATED cont on unasyn  D-dimer 17.6 CRP 10.8 BRONCH BAL - CANDIDA 11/2 - Duplex LE - negative DVT 11/03 extubated 11/5 - C/o hoarseness.  Has dry cough.  Still on HFNC 07/02/22 - Few canddai ain BAL from 07/02/2022. Afebrile. Normal WBC. On 9L HFNC. RNsays easy desats when OOB to 80s and needs 15L [place]. Voice stil very hoarse.  QTC < 5064mc and [facility] started Diflucan for Candida in BAL ENT consult -for hoarsenss - no scope but being  monitored Unasyn stopped (2 week course) SEd rate 119/CRP 11 BNP 1100. Trop 177 ->126 D-dimer better 6.96 11/7 BNP> 1100, Trop 177-> 126 x 24h. REpeat ECHO pending. Only did BiPAP x 45 min last night and could not tolerate. sTill on 15L [place] and pulse 97% 11/9> transferred to SDU for worsening hypoxia> HHFNC  Interim History / Subjective:   Remains on high flow.  Now having runs of NSVT Remains delirious and pulling off his oxygen, refused BiPAP and CPT  Objective   Blood pressure (!) 129/52, pulse (!) 123, temperature 98.1 F (36.7 C), temperature source Oral, resp. rate (!) 29, height _0  (1.778 m), weight 68.2 kg, SpO2 99 %.    FiO2 (%):  [70 %-80 %] 80 %   Intake/Output Summary (Last 24 hours) at [id number] Last data filed at [id number] Gross per 24 hour  Intake 110.43 ml  Output 950 ml  Net -839.57 ml   Filed Weights   07/04/22 0500 07/05/22 0500 07/06/22 0500  Weight: 68.6 kg 68.5 kg 68.2 kg    Examination: Gen:      Chronically ill-appearing, deconditioned male HEENT:  EOMI, sclera anicteric Neck:     No masses; no thyromegaly Lungs:    Increased respiratory effort, using accessory muscles CV:         Regular rate and rhythm; no murmurs Abd:      + bowel sounds; soft, non-tender; no palpable masses, no distension Ext:    No edema; adequate peripheral perfusion Skin:  Warm and dry; no rash Neuro: Awake, confused   Resolved Hospital Problem list     Assessment & Plan:   Acute on chronic hypoxic/hypercapnic respiratory failure from COPD/asthma exacerbation, COVID positive from 06/17/2022, and multifocal pneumonia,  Candida on BAL 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 Hx of Rt upper long lung cancer Concern for Dysphagia - extubated 11/3 - slow failure to improve despite daily lasix and Solumedroil 14m IV daily.  Labs indicating both inflammatory and s-chf component.  Unasyn stopped 11/6.   Empiric diflucan started 11/8 - overall slow/poor progression since extubation on 11/3 despite diuresis, solumedrol, abx - Echo 07/03/22> poor windows  LV 30-35, G1DD, moderately reduced RVSF - steroids stopped 11/9  Plan:  Continue high flow nasal cannula Keep n.p.o. as he is aspiration risk Continue 7-day course of Diflucan which is started on 11/6 Continue nebulizers, diuresis as tolerated. VTE ppx/ lovenox stopped 11/7 due to thrombocytopenia and black stools.  But decompensation happened after moving to recliner and has had persistent tachycardia (low 100's) and increased O2 needs since.   Low suspicion for PE.  Lower extremity duplex on 11/10 with no evidence of DVT. As he continues to decompensate further will need to approach family about comfort measures Discussed with primary team and palliative.  NSVT Troponins are elevated but stable Suspect arrhythmia is due to respiratory distress Monitor and replete lites Telemetry monitoring  Hoarseness. - likely related to COVID and intubation - ENT consul ton 11/7 > no intervention. Will re-evaluation next week.   Remainder per primary team.    Chronic HFrEF. RBBB. HTN, HLD, CAD. CKD 3b. Urine retention. Anemia of critical illness and chronic disease. Thrombocytopenia in setting of critical illness. GERD.  Signature:   PMarshell GarfinkelMD [facility] See Amion for pager  If no response to pager , please call [phone] until 7pm After 7:00 pm call Elink  [phone] 112-10-2021 9:07 AM

## 2022-07-27 NOTE — Progress Notes (Addendum)
Daily Progress Note   Patient [name].       Date: 08/06/22 DOB: 04-30-1941  Age: 81 y.o. MRN#: [id number] Attending Physician: Alma Friendly, MD Primary Care Physician: Coral Spikes, DO Admit Date: 06/02/2022 Length of Stay: 18 days  Reason for Consultation/Follow-up: Establishing goals of care and symptom management  Subjective:   CC: Patient not delirious with increased work of breathing and increasing runs of nonsustained V. tach.  Following up regarding complex medical decision making and symptom management.  Subjective:  Informed by CCM provider that patient's medical status deteriorating this morning and overnight.  Presented to bedside to check on patient.  No family present at time of initial visit.  Patient is confused and agitated.  Remains tachycardic and tachypneic.  Patient trying to pull off oxygen support.  Able to help reposition.  Though patient confused, this provider informed him I would reach out to his wife to let her know what is going on.  ------------------------------------------------------------------------------------------------------------- Advance Care Planning Conversation  Pertinent diagnosis: Acute on chronic hypoxic/hypercapnic respiratory failure in setting of COPD, asthma, COVID, multifocal pneumonia, candidiasis growth found on BAL, NSVT, elevated troponin, encephalopathy  The patient and/or family consented to a voluntary [place]. Individuals present for the conversation: Patient unable to participate in complex medical decision making at this time secondary to encephalopathy in setting of hypoxia.  Spoke with patient's next of kin/decision-maker wife, Paul Sandoval, and patient's son.  Summary of the conversation:  Patient has continued to deteriorate quickly. Able to call patient's wife.  Again introduced myself to her though she remembered talking to me previously.  I explained to her that patient's  time is shortening.  Discussed patient's body is shutting down and he is reaching the end of life.  I explained to her that we should focus on his comfort at this time to make sure he is not pain or agitated for however long he does have.  Wife noted she would be coming in today and I encouraged her to present to bedside as soon as possible.  Wife agreeing with starting medications that focus on patient's comfort such as agitation and dyspnea at this time.  Able to present to bedside later in the morning when wife was present.  Patient appeared calmer after receiving Ativan for agitation though still has increased work of breathing and tachycardic.  Able to discuss with wife and patient's niece at bedside his continued deterioration and the fact that his body is continuing to shut down.  Patient's son was able to join in conversation as well at bedside.  Explained transitioning to comfort focused care at this time including discontinuing interventions that are not based on symptoms and can cause discomfort such as IV fluids, imaging, and even high flow nasal cannula.  Discussed instead transitioning focused care on pain, dyspnea, and agitation at the end of life.  Also discussed weaning patient off high flow while managing his symptoms as patient has shown with attempts to get high flow off that it is uncomfortable for him and making him more agitated.  Family agreeing with transition to full comfort focused care at this time and proceeding with high flow wean.  Did discuss idea that time is short, likely minutes to hours based on current assessment of patient.  Encouraged family to spend time at bedside with patient.  All questions answered at that time.  Thanked family for coming to support patient at the end of his life.  Outcome of the conversations and/or documents completed:  Transitioning to comfort focused care at this time.  Weaning high flow while controlling symptom burden.  I spent 29 minutes  providing separately identifiable ACP services with the patient and/or surrogate decision maker in a voluntary, in-person conversation discussing the patient's wishes and goals as detailed in the above note.  Chelsea Aus, DO Palliative Care Provider  -------------------------------------------------------------------------------------------------------------  Review of Systems Patient reporting continued sensation of dyspnea associated with anxiety. Objective:   Vital Signs:  BP (!) 129/52   Pulse (!) 123   Temp 98.1 F (36.7 C) (Oral)   Resp (!) 30   Ht _0  (1.778 m)   Wt 68.2 kg   SpO2 (!) 85%   BMI 21.57 kg/m   Physical Exam: General: Ill-appearing, appears agitated and in pain, confused Eyes: No drainage noted HENT: Dry mucous membranes, cracked lips Cardiovascular: Tachycardia noted Respiratory: Increased work of breathing noted on high flow via nasal cannula  Abdomen: not distended Extremities: Spontaneously moving upper extremities Skin: Ecchymoses present on upper extremities Neuro: Confused, agitated, unable to participate in complex medical decision making Psych: Agitated, anxious  Assessment & Plan:   Assessment: Patient  Rush Landmark") is an 81 year old male with a past medical history of COPD, chronic bronchitis, tobacco use, HFrEF EF 30-35%, CKD 3B, hyperlipidemia, CAD status post CABG, and history of lung cancer who was added admitted on 06/13/2022 for management of dyspnea, orthopnea, and lower extremity edema.  Patient had recently been admitted and had just been discharged from hospital on 06/07/2022.  During this prolonged hospitalization, patient has required intubation for bronchoscopy which showed yeast present in the lungs and mucous plugging.  Imaging has shown multifocal pneumonia.  Patient was transferred back to ICU on 07/05/2022 for worsening of his respiratory status. Palliative care consulted to assist with complex medical decision-making.  Of note, this  provider had met patient during prior hospitalization and was known with patient and his wife.   Recommendations/Plan: # Complex medical decision making/goals of care:  - Patient unable to participate in complex medical decision making at this time due to encephalopathy in setting of deteriorating medical status.  - Patient had already confirmed that should he be unable to make medical decisions for himself, he would want his wife to make medical decisions for him.    -Spoke with patient's wife initially on telephone and then with her at bedside with son present.  Explained patient is reaching the end of his life and time is likely short.  Family agreeing with transitioning to comfort focused care at this time.  -At this time we will discontinue interventions that are no longer focused on comfort such as IV fluids, imaging, or lab work.  Will wean HFNC while supporting symptom burden at the end of life. Will instead focus on symptom management of pain, dyspnea, and agitation.  - Code Status: DNR   -Prognosis: Minutes to hours, potentially short days  # Symptom management    -Pain/Dyspnea, acute on chronic in the setting of multifocal respiratory failure at the end of life -Started IV Dilaudid continuous basal infusion with a rate of 0.2 mg/h.  Added RN bolus dosing of 0.4 mg every 1 hour for breakthrough symptoms management.  Will begin weaning patient off of high flow nasal cannula as this can cause discomfort and anxiety at the end of life.  May need to further titrate continual and/or bolus dosing of Dilaudid based on patient's symptom burden.                  -  Anxiety/agitation, in the setting of end-of-life care                               -Started IV Ativan 0.5 mg every 2 hours as needed based on patient's severe agitation.                               -And also has IV Haldol 0.5 mg every 4 hours as needed. Continue to adjust based on patient's symptom burden.                    -Secretions, in the setting of end-of-life care                               -Started IV glycopyrrolate 0.2 mg every 4 hours as needed.  # Psychosocial Support:  -Wife, son  # Discharge Planning: Transition to comfort focused care at this time.  Likely in-hospital death.  Unstable for transfer to hospice facility at this time.  Discussed with: Bedside RN, hospitalist, CCM provider, patient's family  Thank you for allowing the palliative care team to participate in the care [place].  Chelsea Aus, DO Palliative Care Provider PMT # [phone]  If patient remains symptomatic despite maximum doses, please call PMT at [phone] between 0700 and 1900. Outside of these hours, please call attending, as PMT does not have night coverage.  UPDATE: Presented to bedside again when RN informed son had further questions regarding patient's care. Able to speak with Legrand Como outside of the room. Reviewed again the process of dying and how patient's body is shutting down; Legrand Como voiced hearing this. Legrand Como does not appear to be coping with grief well and is lashing out at loved ones and staff. Asked Legrand Como if he would like to step outside to talk further for emotional support and he did not want to. Encouraged Legrand Como to focus on time with patient at this time as he was going into patient's room.  Bedside RN informed this provider that Legrand Como had been throwing items and raising his voice to family. Legrand Como has been informed that this behavior cannot be tolerated in the ICU setting. While staff wants to support patient's son in this time of grief, also need to be able to safely care for patient and to allow the patient's wife and niece to spend quality time with him at the end of life. Legrand Como has been informed that should his behavior continue to cause disturbances for patient in setting of end of life care, security may be involved as threats to staff or family cannot be tolerated for safety  concerns.

## 2022-07-27 NOTE — Progress Notes (Signed)
  Daily Progress Note   Patient [name].       Date: 2022-07-27 DOB: 13-Feb-1941  Age: 81 y.o. MRN#: [id number] Attending Physician: Alma Friendly, MD Primary Care Physician: Coral Spikes, DO Admit Date: 06/04/2022 Length of Stay: 18 days  Will complete progress note when able. Placing progress note for medical update. Spoke with patient's wife via telephone. Patient is confused, deteriorating, and appears to be reaching end of life. Informed wife of this and that she and her son needed to come to visit ASAP as time is likely short.  Wife in agreement with making patient comfortable at this time so will provide comfort focused medications.   Chelsea Aus, DO Palliative Care Provider PMT # [phone]  If patient remains symptomatic despite maximum doses, please call PMT at [phone] between 0700 and 1900. Outside of these hours, please call attending, as PMT does not have night coverage.

## 2022-07-27 DEATH — deceased

## 2022-08-07 ENCOUNTER — Ambulatory Visit: Payer: Medicare Other | Admitting: Pulmonary Disease

## 2022-08-09 ENCOUNTER — Encounter (HOSPITAL_COMMUNITY): Payer: Medicare Other | Admitting: Cardiology

## 2023-03-18 ENCOUNTER — Other Ambulatory Visit: Payer: Medicare Other

## 2023-03-20 ENCOUNTER — Ambulatory Visit: Payer: Medicare Other | Admitting: Internal Medicine
